# Patient Record
Sex: Female | Born: 1945 | Race: Black or African American | Hispanic: No | Smoking: Former smoker
Health system: Southern US, Community
[De-identification: ages and names within clinical notes are randomized; demographics above are authoritative.]

## PROBLEM LIST (undated history)

## (undated) DIAGNOSIS — Z950 Presence of cardiac pacemaker: Secondary | ICD-10-CM

## (undated) DIAGNOSIS — Z9221 Personal history of antineoplastic chemotherapy: Secondary | ICD-10-CM

## (undated) DIAGNOSIS — C50912 Malignant neoplasm of unspecified site of left female breast: Secondary | ICD-10-CM

## (undated) DIAGNOSIS — I5041 Acute combined systolic (congestive) and diastolic (congestive) heart failure: Secondary | ICD-10-CM

## (undated) DIAGNOSIS — N179 Acute kidney failure, unspecified: Secondary | ICD-10-CM

## (undated) DIAGNOSIS — I2510 Atherosclerotic heart disease of native coronary artery without angina pectoris: Secondary | ICD-10-CM

## (undated) DIAGNOSIS — I519 Heart disease, unspecified: Secondary | ICD-10-CM

## (undated) DIAGNOSIS — T4145XA Adverse effect of unspecified anesthetic, initial encounter: Secondary | ICD-10-CM

## (undated) DIAGNOSIS — E119 Type 2 diabetes mellitus without complications: Secondary | ICD-10-CM

## (undated) DIAGNOSIS — Z8739 Personal history of other diseases of the musculoskeletal system and connective tissue: Secondary | ICD-10-CM

## (undated) DIAGNOSIS — G4733 Obstructive sleep apnea (adult) (pediatric): Secondary | ICD-10-CM

## (undated) DIAGNOSIS — R011 Cardiac murmur, unspecified: Secondary | ICD-10-CM

## (undated) DIAGNOSIS — R0600 Dyspnea, unspecified: Secondary | ICD-10-CM

## (undated) DIAGNOSIS — E039 Hypothyroidism, unspecified: Secondary | ICD-10-CM

## (undated) DIAGNOSIS — Z923 Personal history of irradiation: Secondary | ICD-10-CM

## (undated) DIAGNOSIS — T8859XA Other complications of anesthesia, initial encounter: Secondary | ICD-10-CM

## (undated) DIAGNOSIS — D759 Disease of blood and blood-forming organs, unspecified: Secondary | ICD-10-CM

## (undated) DIAGNOSIS — M1711 Unilateral primary osteoarthritis, right knee: Secondary | ICD-10-CM

## (undated) DIAGNOSIS — Z531 Procedure and treatment not carried out because of patient's decision for reasons of belief and group pressure: Secondary | ICD-10-CM

## (undated) DIAGNOSIS — I10 Essential (primary) hypertension: Secondary | ICD-10-CM

## (undated) DIAGNOSIS — IMO0001 Reserved for inherently not codable concepts without codable children: Secondary | ICD-10-CM

## (undated) DIAGNOSIS — M1990 Unspecified osteoarthritis, unspecified site: Secondary | ICD-10-CM

## (undated) DIAGNOSIS — E669 Obesity, unspecified: Secondary | ICD-10-CM

## (undated) DIAGNOSIS — M10331 Gout due to renal impairment, right wrist: Secondary | ICD-10-CM

## (undated) DIAGNOSIS — I509 Heart failure, unspecified: Secondary | ICD-10-CM

## (undated) DIAGNOSIS — Z9989 Dependence on other enabling machines and devices: Secondary | ICD-10-CM

## (undated) HISTORY — DX: Obstructive sleep apnea (adult) (pediatric): G47.33

## (undated) HISTORY — PX: DILATION AND CURETTAGE OF UTERUS: SHX78

## (undated) HISTORY — PX: JOINT REPLACEMENT: SHX530

## (undated) HISTORY — DX: Obesity, unspecified: E66.9

## (undated) HISTORY — PX: TUBAL LIGATION: SHX77

## (undated) HISTORY — PX: CATARACT EXTRACTION W/ INTRAOCULAR LENS  IMPLANT, BILATERAL: SHX1307

## (undated) HISTORY — PX: CORONARY ANGIOPLASTY WITH STENT PLACEMENT: SHX49

## (undated) HISTORY — PX: TONSILLECTOMY: SUR1361

## (undated) HISTORY — DX: Acute combined systolic (congestive) and diastolic (congestive) heart failure: I50.41

---

## 1976-05-28 HISTORY — PX: THYROIDECTOMY, PARTIAL: SHX18

## 1983-05-29 DIAGNOSIS — C50912 Malignant neoplasm of unspecified site of left female breast: Secondary | ICD-10-CM

## 1983-05-29 HISTORY — DX: Malignant neoplasm of unspecified site of left female breast: C50.912

## 1983-05-29 HISTORY — PX: BREAST LUMPECTOMY: SHX2

## 1983-05-29 HISTORY — PX: BREAST LUMPECTOMY WITH NEEDLE LOCALIZATION AND AXILLARY LYMPH NODE DISSECTION: SHX5758

## 1983-05-29 HISTORY — PX: BREAST BIOPSY: SHX20

## 2012-09-25 HISTORY — PX: REPLACEMENT TOTAL KNEE: SUR1224

## 2012-12-22 ENCOUNTER — Encounter (HOSPITAL_COMMUNITY): Payer: Self-pay | Admitting: *Deleted

## 2012-12-22 ENCOUNTER — Emergency Department (INDEPENDENT_AMBULATORY_CARE_PROVIDER_SITE_OTHER)
Admission: EM | Admit: 2012-12-22 | Discharge: 2012-12-22 | Disposition: A | Discharge status: Home / Self Care | Payer: Medicare Other | Source: Home / Self Care

## 2012-12-22 DIAGNOSIS — B029 Zoster without complications: Secondary | ICD-10-CM

## 2012-12-22 HISTORY — DX: Heart disease, unspecified: I51.9

## 2012-12-22 MED ORDER — HYDROMORPHONE HCL 1 MG/ML IJ SOLN
2.0000 mg | Freq: Once | INTRAMUSCULAR | Status: AC
  Administered 2012-12-22: 2 mg via INTRAMUSCULAR

## 2012-12-22 MED ORDER — HYDROMORPHONE HCL PF 1 MG/ML IJ SOLN
INTRAMUSCULAR | Status: AC
  Filled 2012-12-22: qty 2

## 2012-12-22 MED ORDER — VALACYCLOVIR HCL 1 G PO TABS: 1000.0000 mg | ORAL_TABLET | Freq: Three times a day (TID) | ORAL | Status: AC

## 2012-12-22 MED ORDER — HYDROCODONE-ACETAMINOPHEN 10-325 MG PO TABS: 1.0000 | ORAL_TABLET | Freq: Four times a day (QID) | ORAL | Status: DC | PRN

## 2012-12-22 NOTE — ED Notes (Signed)
C/o blistery rash on L hip that goes around to L buttocks onset yesterday. Pain onset a few days ago.  C/o itching and burning.

## 2012-12-22 NOTE — ED Provider Notes (Signed)
CSN: [id number]     Arrival date & time 12/22/12  1549 History     First MD Initiated Contact with Patient 12/22/12 1633     Chief Complaint  Patient presents with  . Rash   (Consider location/radiation/quality/duration/timing/severity/associated sxs/prior Treatment) HPI  67 yo bf presents today with a painful, blistery rash on right lower back, buttock and groin area x 1-2 days.  Started with pain a couple of days ago then progressed to blisters.  Very painful and constant.  No previous hx of shingles.  Denies fever, chills, nausea, vomiting.  Here visiting from new [place].    Past Medical History  Diagnosis Date  . Diabetes mellitus without complication   . Hypertension   . Thyroid disease     hypothyroid  . Heart disease   . Cancer    Past Surgical History  Procedure Laterality Date  . Coronary stent placement  2010, 2012     2 done 2010 and 1 replaced 2012  . 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 Breast lumpectomy with needle localization and axillary lymph node dissection  1985   Family History  Problem Relation Age of Onset  . Multiple myeloma Mother   . Heart disease Father    History  Substance Use Topics  . Smoking status: Never Smoker   . Smokeless tobacco: Not on file  . Alcohol Use: Yes     Comment: ocassional   OB History   Grav Para Term Preterm Abortions TAB SAB Ect Mult Living                 Review of Systems  Constitutional: Negative.   HENT: Negative.   Eyes: Negative.   Respiratory: Negative.   Cardiovascular: Negative.   Gastrointestinal: Negative.   Endocrine: Negative.   Genitourinary: Negative.   Musculoskeletal: Negative.   Skin: Positive for rash.  Neurological: Negative.   Psychiatric/Behavioral: Negative.     Allergies  Sulfa antibiotics  Home Medications   Current Outpatient Rx  Name  Route  Sig  Dispense  Refill  . HYDROcodone-acetaminophen (NORCO) 10-325 MG per tablet   Oral   Take 1 tablet by mouth every 6 (six)  hours as needed for pain.   50 tablet   0   . valACYclovir (VALTREX) 1000 MG tablet   Oral   Take 1 tablet (1,000 mg total) by mouth 3 (three) times daily.   21 tablet   1     Take medication for one week.    BP 172/80  Pulse 76  Temp(Src) 98.9 F (37.2 C) (Oral)  Resp 18  SpO2 99% Physical Exam  Constitutional: She is oriented to person, place, and time. She appears well-developed and well-nourished.  HENT:  Head: Normocephalic and atraumatic.  Eyes: EOM are normal. Pupils are equal, round, and reactive to light.  Neck: Normal range of motion.  Pulmonary/Chest: Effort normal.  Musculoskeletal: Normal range of motion.  Neurological: She is alert and oriented to person, place, and time.  Skin:  Red blistery rash on right lower back, buttock and upper thigh.  Area tender to palpation. No signs of infection.    Psychiatric: She has a normal mood and affect.    ED Course   Procedures (including critical care time)  Labs Reviewed - No data to display No results found. 1. Shingles     MDM  Patient given dilaudid 2gm im injection today.   Scripts for valtrex and norco.  Will return to clinic if  symptoms worsen or go to ED.  All questions answered.  Advised that this is contagious.  Instructions given.   Meds ordered this encounter  Medications  . HYDROmorphone (DILAUDID) injection 2 mg    Sig:   . valACYclovir (VALTREX) 1000 MG tablet    Sig: Take 1 tablet (1,000 mg total) by mouth 3 (three) times daily.    Dispense:  21 tablet    Refill:  1    Take medication for one week.  Marland Kitchen HYDROcodone-acetaminophen (NORCO) 10-325 MG per tablet    Sig: Take 1 tablet by mouth every 6 (six) hours as needed for pain.    Dispense:  50 tablet    Refill:  0    Zonia Kief, PA-C 12/22/12 1736

## 2012-12-22 NOTE — ED Provider Notes (Signed)
Medical screening examination/treatment/procedure(s) were performed by resident physician or non-physician practitioner and as supervising physician I was immediately available for consultation/collaboration.   Juventino Pavone DOUGLAS MD.   Alyzza Andringa D Makelle Marrone, MD 12/22/12 2151 

## 2013-11-24 ENCOUNTER — Emergency Department (INDEPENDENT_AMBULATORY_CARE_PROVIDER_SITE_OTHER)
Admission: EM | Admit: 2013-11-24 | Discharge: 2013-11-24 | Disposition: A | Discharge status: Home / Self Care | Payer: Medicare Other | Source: Home / Self Care | Attending: Family Medicine | Admitting: Family Medicine

## 2013-11-24 ENCOUNTER — Encounter (HOSPITAL_COMMUNITY): Payer: Self-pay | Admitting: Emergency Medicine

## 2013-11-24 DIAGNOSIS — H6093 Unspecified otitis externa, bilateral: Secondary | ICD-10-CM

## 2013-11-24 DIAGNOSIS — H60399 Other infective otitis externa, unspecified ear: Secondary | ICD-10-CM

## 2013-11-24 MED ORDER — NEOMYCIN-POLYMYXIN-HC 1 % OT SOLN
4.0000 [drp] | Freq: Four times a day (QID) | OTIC | Status: DC
  Administered 2013-11-24: 4 [drp] via OTIC

## 2013-11-24 MED ORDER — ANTIPYRINE-BENZOCAINE 5.4-1.4 % OT SOLN: 3.0000 [drp] | OTIC | Status: DC | PRN

## 2013-11-24 NOTE — ED Notes (Signed)
C/o  Left ear pain x 1 wk.   States "feels like water is in ear".  No able to hear out of left ear.  Having fever.    No otc meds tried for treatment.

## 2013-11-24 NOTE — ED Provider Notes (Signed)
Danielle [name] is a 68 y.o. female who presents to Urgent Care today for bilateral left worse than right ear pain present for one week associated with decreased hearing and discharge. No fevers or chills nausea vomiting or diarrhea. The pain is moderate. Patient has tried peroxide which has not helped.   Past Medical History  Diagnosis Date  . Diabetes mellitus without complication   . Hypertension   . Thyroid disease     hypothyroid  . Heart disease   . Cancer    History  Substance Use Topics  . Smoking status: Never Smoker   . Smokeless tobacco: Not on file  . Alcohol Use: Yes     Comment: ocassional   ROS as above Medications: Current Facility-Administered Medications  Medication Dose Route Frequency Provider Last Rate Last Dose  . NEOMYCIN-POLYMYXIN-HYDROCORTISONE (CORTISPORIN) otic solution 4 drop  4 drop Both Ears 4 times per day Gregor Hams, MD   4 drop at 11/24/13 1754   Current Outpatient Prescriptions  Medication Sig Dispense Refill  . Alpha Lipoic Acid 200 MG CAPS Take by mouth.      Marland Kitchen aspirin 81 MG tablet Take 81 mg by mouth daily.      Marland Kitchen atorvastatin (LIPITOR) 20 MG tablet Take 20 mg by mouth at bedtime.      . calcium citrate (CALCITRATE - DOSED IN MG ELEMENTAL CALCIUM) 950 MG tablet Take 1 tablet by mouth daily.      . carvedilol (COREG CR) 40 MG 24 hr capsule Take 40 mg by mouth daily.      . clopidogrel (PLAVIX) 75 MG tablet Take 75 mg by mouth daily.      . diazepam (VALIUM) 5 MG tablet Take 5 mg by mouth every 8 (eight) hours as needed for anxiety.      . fish oil-omega-3 fatty acids 1000 MG capsule Take 1 g by mouth daily.      . folic acid (FOLVITE) 1 MG tablet Take 1 mg by mouth daily.      Marland Kitchen glucosamine-chondroitin 500-400 MG tablet Take 1 tablet by mouth 2 (two) times daily.      Marland Kitchen HYDROcodone-acetaminophen (NORCO) 10-325 MG per tablet Take 1 tablet by mouth every 6 (six) hours as needed for pain.  50 tablet  0  . insulin glargine (LANTUS) 100 UNIT/ML  injection Inject 40 Units into the skin 2 (two) times daily before a meal.      . insulin lispro (HUMALOG) 100 UNIT/ML injection Inject 10 Units into the skin 3 (three) times daily before meals.      . isosorbide mononitrate (IMDUR) 30 MG 24 hr tablet Take 30 mg by mouth daily.      Marland Kitchen levothyroxine (SYNTHROID, LEVOTHROID) 125 MCG tablet Take 125 mcg by mouth daily before breakfast.      . Magnesium Oxide 500 MG CAPS Take by mouth.      . meloxicam (MOBIC) 7.5 MG tablet Take 7.5 mg by mouth daily.      . Multiple Vitamin (MULTIVITAMIN) tablet Take 1 tablet by mouth daily.      Marland Kitchen NAPROXEN PO Take 500 mg by mouth 2 (two) times daily.      Marland Kitchen oxyCODONE-acetaminophen (PERCOCET/ROXICET) 5-325 MG per tablet Take 1 tablet by mouth every 4 (four) hours as needed for pain.      Marland Kitchen spironolactone (ALDACTONE) 25 MG tablet Take 25 mg by mouth daily.      . valsartan (DIOVAN) 40 MG tablet Take 40 mg by  mouth daily.      Marland Kitchen antipyrine-benzocaine (AURALGAN) otic solution Place 3-4 drops into both ears every 2 (two) hours as needed for ear pain.  10 mL  0  . torsemide (DEMADEX) 20 MG tablet Take 20 mg by mouth daily.        Exam:  BP 124/62  Pulse 85  Temp(Src) 98.9 F (37.2 C) (Oral)  Resp 18  SpO2 100% Gen: Well NAD HEENT: EOMI,  MMM bilateral ear canals are inflamed and erythematous with small amount of discharge and tender. Nontender mastoids bilaterally.  An ear wick was placed bilaterally. Cortisporin drops were placed into the left ear  Assessment and Plan: 68 y.o. female with otitis externa. Ear wick placed. Cortisporin and Auralgan drops  Discussed warning signs or symptoms. Please see discharge instructions. Patient expresses understanding.    Gregor Hams, MD 11/24/13 [phone]

## 2013-11-24 NOTE — Discharge Instructions (Signed)
Thank you for coming in today. The wick should come out in 2 days. Come back if they do not come out. Return if worsening.  Use the Cortisporin drops every 6 hours and the Auralgan drops every 2 hours for pain as needed  Otitis Externa Otitis externa is a bacterial or fungal infection of the outer ear canal. This is the area from the eardrum to the outside of the ear. Otitis externa is sometimes called "swimmer's ear." CAUSES  Possible causes of infection include:  Swimming in dirty water.  Moisture remaining in the ear after swimming or bathing.  Mild injury (trauma) to the ear.  Objects stuck in the ear (foreign body).  Cuts or scrapes (abrasions) on the outside of the ear. SYMPTOMS  The first symptom of infection is often itching in the ear canal. Later signs and symptoms may include swelling and redness of the ear canal, ear pain, and yellowish-white fluid (pus) coming from the ear. The ear pain may be worse when pulling on the earlobe. DIAGNOSIS  Your caregiver will perform a physical exam. A sample of fluid may be taken from the ear and examined for bacteria or fungi. TREATMENT  Antibiotic ear drops are often given for 10 to 14 days. Treatment may also include pain medicine or corticosteroids to reduce itching and swelling. PREVENTION   Keep your ear dry. Use the corner of a towel to absorb water out of the ear canal after swimming or bathing.  Avoid scratching or putting objects inside your ear. This can damage the ear canal or remove the protective wax that lines the canal. This makes it easier for bacteria and fungi to grow.  Avoid swimming in lakes, polluted water, or poorly chlorinated pools.  You may use ear drops made of rubbing alcohol and vinegar after swimming. Combine equal parts of white vinegar and alcohol in a bottle. Put 3 or 4 drops into each ear after swimming. HOME CARE INSTRUCTIONS   Apply antibiotic ear drops to the ear canal as prescribed by your  caregiver.  Only take over-the-counter or prescription medicines for pain, discomfort, or fever as directed by your caregiver.  If you have diabetes, follow any additional treatment instructions from your caregiver.  Keep all follow-up appointments as directed by your caregiver. SEEK MEDICAL CARE IF:   You have a fever.  Your ear is still red, swollen, painful, or draining pus after 3 days.  Your redness, swelling, or pain gets worse.  You have a severe headache.  You have redness, swelling, pain, or tenderness in the area behind your ear. MAKE SURE YOU:   Understand these instructions.  Will watch your condition.  Will get help right away if you are not doing well or get worse. Document Released: 05/14/2005 Document Revised: 08/06/2011 Document Reviewed: 05/31/2011 [facility] Patient Information 2015 [place], [place]. This information is not intended to replace advice given to you by your health care provider. Make sure you discuss any questions you have with your health care provider.

## 2013-12-08 ENCOUNTER — Encounter (HOSPITAL_COMMUNITY): Payer: Self-pay | Admitting: Emergency Medicine

## 2013-12-08 ENCOUNTER — Emergency Department (INDEPENDENT_AMBULATORY_CARE_PROVIDER_SITE_OTHER)
Admission: EM | Admit: 2013-12-08 | Discharge: 2013-12-08 | Disposition: A | Discharge status: Home / Self Care | Payer: Medicare Other | Source: Home / Self Care | Attending: Family Medicine | Admitting: Family Medicine

## 2013-12-08 DIAGNOSIS — H6093 Unspecified otitis externa, bilateral: Secondary | ICD-10-CM

## 2013-12-08 DIAGNOSIS — H60399 Other infective otitis externa, unspecified ear: Secondary | ICD-10-CM

## 2013-12-08 MED ORDER — OFLOXACIN 0.3 % OT SOLN: 10.0000 [drp] | Freq: Every day | OTIC | Status: DC

## 2013-12-08 NOTE — ED Provider Notes (Signed)
CSN: [id number]     Arrival date & time 12/08/13  1643 History   First MD Initiated Contact with Patient 12/08/13 1718     Chief Complaint  Patient presents with  . Otalgia   (Consider location/radiation/quality/duration/timing/severity/associated sxs/prior Treatment) HPI Comments: Was recently treated with Polymyxin ear drops and symptoms have not improved.  Is here visiting her daughter from [place].   Patient is a 68 y.o. female presenting with ear pain. The history is provided by the patient.  Otalgia Location:  Bilateral Behind ear:  No abnormality Quality:  Aching and sharp Severity:  Moderate Onset quality:  Gradual Duration:  3 weeks Timing:  Constant Progression:  Unchanged Chronicity:  New Associated symptoms: ear discharge   Associated symptoms: no fever     Past Medical History  Diagnosis Date  . Diabetes mellitus without complication   . Hypertension   . Heart disease   . Cancer     breast  . Thyroid disease     hypothyroid   Past Surgical History  Procedure Laterality Date  . Coronary stent placement  2010, 2012     2 done 2010 and 1 replaced 2012  . 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 Breast lumpectomy with needle localization and axillary lymph node dissection  1985  . Thyroidectomy, partial  1978   Family History  Problem Relation Age of Onset  . Multiple myeloma Mother   . Heart disease Father    History  Substance Use Topics  . Smoking status: Never Smoker   . Smokeless tobacco: Not on file  . Alcohol Use: Yes     Comment: ocassional   OB History   Grav Para Term Preterm Abortions TAB SAB Ect Mult Living                 Review of Systems  Constitutional: Negative for fever.  HENT: Positive for ear discharge and ear pain.   All other systems reviewed and are negative.   Allergies  Sulfa antibiotics  Home Medications   Prior to Admission medications   Medication Sig Start Date End Date Taking? Authorizing Provider  aspirin 81 MG  tablet Take 81 mg by mouth daily.   Yes Historical Provider, MD  atorvastatin (LIPITOR) 20 MG tablet Take 20 mg by mouth at bedtime.   Yes Historical Provider, MD  carvedilol (COREG CR) 40 MG 24 hr capsule Take 40 mg by mouth daily.   Yes Historical Provider, MD  clopidogrel (PLAVIX) 75 MG tablet Take 75 mg by mouth daily.   Yes Historical Provider, MD  [facility] Liver Oil OIL Take 5 mLs by mouth every morning.   Yes Historical Provider, MD  folic acid (FOLVITE) 1 MG tablet Take 1 mg by mouth daily.   Yes Historical Provider, MD  insulin lispro (HUMALOG) 100 UNIT/ML injection Inject 10 Units into the skin 3 (three) times daily before meals.   Yes Historical Provider, MD  levothyroxine (SYNTHROID, LEVOTHROID) 125 MCG tablet Take 125 mcg by mouth daily before breakfast.   Yes Historical Provider, MD  torsemide (DEMADEX) 20 MG tablet Take 20 mg by mouth daily.   Yes Historical Provider, MD  valsartan (DIOVAN) 40 MG tablet Take 40 mg by mouth 2 (two) times daily.    Yes Historical Provider, MD  Alpha Lipoic Acid 200 MG CAPS Take by mouth.    Historical Provider, MD  antipyrine-benzocaine Toniann Fail) otic solution Place 3-4 drops into both ears every 2 (two) hours as needed for ear pain.  11/24/13   Gregor Hams, MD  calcium citrate (CALCITRATE - DOSED IN MG ELEMENTAL CALCIUM) 950 MG tablet Take 1 tablet by mouth daily.    Historical Provider, MD  diazepam (VALIUM) 5 MG tablet Take 5 mg by mouth every 8 (eight) hours as needed for anxiety.    Historical Provider, MD  fish oil-omega-3 fatty acids 1000 MG capsule Take 1 g by mouth daily.    Historical Provider, MD  glucosamine-chondroitin 500-400 MG tablet Take 1 tablet by mouth 2 (two) times daily.    Historical Provider, MD  HYDROcodone-acetaminophen (NORCO) 10-325 MG per tablet Take 1 tablet by mouth every 6 (six) hours as needed for pain. 12/22/12   Benjiman Core, PA-C  insulin glargine (LANTUS) 100 UNIT/ML injection Inject 45 Units into the skin 2 (two) times  daily before a meal.     Historical Provider, MD  isosorbide mononitrate (IMDUR) 30 MG 24 hr tablet Take 30 mg by mouth daily.    Historical Provider, MD  Magnesium Oxide 500 MG CAPS Take by mouth.    Historical Provider, MD  meloxicam (MOBIC) 7.5 MG tablet Take 7.5 mg by mouth daily.    Historical Provider, MD  Multiple Vitamin (MULTIVITAMIN) tablet Take 1 tablet by mouth daily.    Historical Provider, MD  NAPROXEN PO Take 500 mg by mouth 2 (two) times daily.    Historical Provider, MD  ofloxacin (FLOXIN) 0.3 % otic solution Place 10 drops into both ears daily. X 7 days 12/08/13   Lahoma Rocker, PA  oxyCODONE-acetaminophen (PERCOCET/ROXICET) 5-325 MG per tablet Take 1 tablet by mouth every 4 (four) hours as needed for pain.    Historical Provider, MD  spironolactone (ALDACTONE) 25 MG tablet Take 25 mg by mouth daily.    Historical Provider, MD   BP 136/69  Pulse 78  Temp(Src) 98.5 F (36.9 C) (Oral)  Resp 16  SpO2 98% Physical Exam  Nursing note and vitals reviewed. Constitutional: She is oriented to person, place, and time. She appears well-developed and well-nourished.  HENT:  Head: Normocephalic and atraumatic.  Right Ear: There is tenderness. No foreign bodies. No mastoid tenderness. Decreased hearing is noted.  Left Ear: There is tenderness. No foreign bodies. No mastoid tenderness. Decreased hearing is noted.  Nose: Nose normal.  Mouth/Throat: Oropharynx is clear and moist.  Moderate bilateral otitis external with moderate amount of white/yellow exudate in ear canals that obstructs visualization of TMs.  Eyes: Conjunctivae are normal. No scleral icterus.  Neck: Normal range of motion. Neck supple.  Cardiovascular: Normal rate.   Pulmonary/Chest: Effort normal.  Musculoskeletal: Normal range of motion.  Lymphadenopathy:    She has no cervical adenopathy.  Neurological: She is alert and oriented to person, place, and time.  Skin: Skin is warm and dry.  Psychiatric: Her  behavior is normal.    ED Course  Procedures (including critical care time) Labs Review Labs Reviewed - No data to display  Imaging Review No results found.   MDM   1. Otitis externa of both ears    Will discontinue Polymyxin and provide Rx for Ofloxacin otic and advise follow up with [facility]NT if no improvement over the next 4-5 days.    [place], [place] 12/08/13 [phone]

## 2013-12-08 NOTE — Discharge Instructions (Signed)
Discontinue Polymyxin ear drops and begin using the Ofloxacin ear drops as directed and follow up with Dr. Redmond Baseman if no improvement.  Otitis Externa Otitis externa is a bacterial or fungal infection of the outer ear canal. This is the area from the eardrum to the outside of the ear. Otitis externa is sometimes called "swimmer's ear." CAUSES  Possible causes of infection include:  Swimming in dirty water.  Moisture remaining in the ear after swimming or bathing.  Mild injury (trauma) to the ear.  Objects stuck in the ear (foreign body).  Cuts or scrapes (abrasions) on the outside of the ear. SYMPTOMS  The first symptom of infection is often itching in the ear canal. Later signs and symptoms may include swelling and redness of the ear canal, ear pain, and yellowish-white fluid (pus) coming from the ear. The ear pain may be worse when pulling on the earlobe. DIAGNOSIS  Your caregiver will perform a physical exam. A sample of fluid may be taken from the ear and examined for bacteria or fungi. TREATMENT  Antibiotic ear drops are often given for 10 to 14 days. Treatment may also include pain medicine or corticosteroids to reduce itching and swelling. PREVENTION   Keep your ear dry. Use the corner of a towel to absorb water out of the ear canal after swimming or bathing.  Avoid scratching or putting objects inside your ear. This can damage the ear canal or remove the protective wax that lines the canal. This makes it easier for bacteria and fungi to grow.  Avoid swimming in lakes, polluted water, or poorly chlorinated pools.  You may use ear drops made of rubbing alcohol and vinegar after swimming. Combine equal parts of white vinegar and alcohol in a bottle. Put 3 or 4 drops into each ear after swimming. HOME CARE INSTRUCTIONS   Apply antibiotic ear drops to the ear canal as prescribed by your caregiver.  Only take over-the-counter or prescription medicines for pain, discomfort, or fever  as directed by your caregiver.  If you have diabetes, follow any additional treatment instructions from your caregiver.  Keep all follow-up appointments as directed by your caregiver. SEEK MEDICAL CARE IF:   You have a fever.  Your ear is still red, swollen, painful, or draining pus after 3 days.  Your redness, swelling, or pain gets worse.  You have a severe headache.  You have redness, swelling, pain, or tenderness in the area behind your ear. MAKE SURE YOU:   Understand these instructions.  Will watch your condition.  Will get help right away if you are not doing well or get worse. Document Released: 05/14/2005 Document Revised: 08/06/2011 Document Reviewed: 05/31/2011 [facility] Patient Information 2015 [place], [place]. This information is not intended to replace advice given to you by your health care provider. Make sure you discuss any questions you have with your health care provider.

## 2013-12-08 NOTE — ED Notes (Signed)
C/o bil. ear pain, L right onset 3 weeks ago.  Was seen here and given antibiotic ear drops and drops for pain. Had ear wicks put in and they came out the next day.  C/o dead skin in her ear and thinks it might be a fungal infection.

## 2013-12-09 NOTE — ED Provider Notes (Signed)
Medical screening examination/treatment/procedure(s) were performed by non-physician practitioner and as supervising physician I was immediately available for consultation/collaboration.  Philipp Deputy, M.D.  Harden Mo, MD 12/09/13 [phone]

## 2014-07-19 HISTORY — PX: INSERT / REPLACE / REMOVE PACEMAKER: SUR710

## 2016-06-04 DIAGNOSIS — R062 Wheezing: Secondary | ICD-10-CM | POA: Diagnosis not present

## 2016-06-04 DIAGNOSIS — J302 Other seasonal allergic rhinitis: Secondary | ICD-10-CM | POA: Diagnosis not present

## 2016-06-04 DIAGNOSIS — J019 Acute sinusitis, unspecified: Secondary | ICD-10-CM | POA: Diagnosis not present

## 2016-06-05 DIAGNOSIS — J0100 Acute maxillary sinusitis, unspecified: Secondary | ICD-10-CM | POA: Diagnosis not present

## 2016-06-07 DIAGNOSIS — Z794 Long term (current) use of insulin: Secondary | ICD-10-CM | POA: Diagnosis not present

## 2016-06-07 DIAGNOSIS — H43813 Vitreous degeneration, bilateral: Secondary | ICD-10-CM | POA: Diagnosis not present

## 2016-06-07 DIAGNOSIS — E113313 Type 2 diabetes mellitus with moderate nonproliferative diabetic retinopathy with macular edema, bilateral: Secondary | ICD-10-CM | POA: Diagnosis not present

## 2016-06-07 DIAGNOSIS — H35371 Puckering of macula, right eye: Secondary | ICD-10-CM | POA: Diagnosis not present

## 2016-06-09 DIAGNOSIS — M7989 Other specified soft tissue disorders: Secondary | ICD-10-CM | POA: Diagnosis not present

## 2016-06-09 DIAGNOSIS — I972 Postmastectomy lymphedema syndrome: Secondary | ICD-10-CM | POA: Diagnosis not present

## 2016-06-11 DIAGNOSIS — M7989 Other specified soft tissue disorders: Secondary | ICD-10-CM | POA: Diagnosis not present

## 2016-06-11 DIAGNOSIS — I972 Postmastectomy lymphedema syndrome: Secondary | ICD-10-CM | POA: Diagnosis not present

## 2016-06-13 DIAGNOSIS — I972 Postmastectomy lymphedema syndrome: Secondary | ICD-10-CM | POA: Diagnosis not present

## 2016-06-13 DIAGNOSIS — M7989 Other specified soft tissue disorders: Secondary | ICD-10-CM | POA: Diagnosis not present

## 2016-06-16 DIAGNOSIS — I972 Postmastectomy lymphedema syndrome: Secondary | ICD-10-CM | POA: Diagnosis not present

## 2016-06-16 DIAGNOSIS — M7989 Other specified soft tissue disorders: Secondary | ICD-10-CM | POA: Diagnosis not present

## 2016-06-20 DIAGNOSIS — M7989 Other specified soft tissue disorders: Secondary | ICD-10-CM | POA: Diagnosis not present

## 2016-06-20 DIAGNOSIS — I972 Postmastectomy lymphedema syndrome: Secondary | ICD-10-CM | POA: Diagnosis not present

## 2016-06-23 DIAGNOSIS — M7989 Other specified soft tissue disorders: Secondary | ICD-10-CM | POA: Diagnosis not present

## 2016-06-23 DIAGNOSIS — I972 Postmastectomy lymphedema syndrome: Secondary | ICD-10-CM | POA: Diagnosis not present

## 2016-06-27 DIAGNOSIS — I972 Postmastectomy lymphedema syndrome: Secondary | ICD-10-CM | POA: Diagnosis not present

## 2016-06-27 DIAGNOSIS — M7989 Other specified soft tissue disorders: Secondary | ICD-10-CM | POA: Diagnosis not present

## 2016-07-02 DIAGNOSIS — M7989 Other specified soft tissue disorders: Secondary | ICD-10-CM | POA: Diagnosis not present

## 2016-07-02 DIAGNOSIS — I972 Postmastectomy lymphedema syndrome: Secondary | ICD-10-CM | POA: Diagnosis not present

## 2016-07-03 DIAGNOSIS — R42 Dizziness and giddiness: Secondary | ICD-10-CM | POA: Diagnosis not present

## 2016-07-03 DIAGNOSIS — D519 Vitamin B12 deficiency anemia, unspecified: Secondary | ICD-10-CM | POA: Diagnosis not present

## 2016-07-03 DIAGNOSIS — I10 Essential (primary) hypertension: Secondary | ICD-10-CM | POA: Diagnosis not present

## 2016-07-03 DIAGNOSIS — Z7951 Long term (current) use of inhaled steroids: Secondary | ICD-10-CM | POA: Diagnosis not present

## 2016-07-03 DIAGNOSIS — R609 Edema, unspecified: Secondary | ICD-10-CM | POA: Diagnosis not present

## 2016-07-03 DIAGNOSIS — H902 Conductive hearing loss, unspecified: Secondary | ICD-10-CM | POA: Diagnosis not present

## 2016-07-03 DIAGNOSIS — E1165 Type 2 diabetes mellitus with hyperglycemia: Secondary | ICD-10-CM | POA: Diagnosis not present

## 2016-07-03 DIAGNOSIS — M150 Primary generalized (osteo)arthritis: Secondary | ICD-10-CM | POA: Diagnosis not present

## 2016-07-03 DIAGNOSIS — R7989 Other specified abnormal findings of blood chemistry: Secondary | ICD-10-CM | POA: Diagnosis not present

## 2016-07-03 DIAGNOSIS — R5383 Other fatigue: Secondary | ICD-10-CM | POA: Diagnosis not present

## 2016-07-03 DIAGNOSIS — E559 Vitamin D deficiency, unspecified: Secondary | ICD-10-CM | POA: Diagnosis not present

## 2016-07-03 DIAGNOSIS — E1142 Type 2 diabetes mellitus with diabetic polyneuropathy: Secondary | ICD-10-CM | POA: Diagnosis not present

## 2016-07-04 DIAGNOSIS — H6123 Impacted cerumen, bilateral: Secondary | ICD-10-CM | POA: Diagnosis not present

## 2016-07-04 DIAGNOSIS — H938X2 Other specified disorders of left ear: Secondary | ICD-10-CM | POA: Diagnosis not present

## 2016-07-06 DIAGNOSIS — M7989 Other specified soft tissue disorders: Secondary | ICD-10-CM | POA: Diagnosis not present

## 2016-07-06 DIAGNOSIS — I972 Postmastectomy lymphedema syndrome: Secondary | ICD-10-CM | POA: Diagnosis not present

## 2016-07-10 DIAGNOSIS — E1140 Type 2 diabetes mellitus with diabetic neuropathy, unspecified: Secondary | ICD-10-CM | POA: Diagnosis not present

## 2016-07-10 DIAGNOSIS — I2510 Atherosclerotic heart disease of native coronary artery without angina pectoris: Secondary | ICD-10-CM | POA: Diagnosis not present

## 2016-07-10 DIAGNOSIS — K2900 Acute gastritis without bleeding: Secondary | ICD-10-CM | POA: Diagnosis not present

## 2016-07-10 DIAGNOSIS — I10 Essential (primary) hypertension: Secondary | ICD-10-CM | POA: Diagnosis not present

## 2016-07-10 DIAGNOSIS — D561 Beta thalassemia: Secondary | ICD-10-CM | POA: Diagnosis not present

## 2016-07-11 DIAGNOSIS — I972 Postmastectomy lymphedema syndrome: Secondary | ICD-10-CM | POA: Diagnosis not present

## 2016-07-11 DIAGNOSIS — M7989 Other specified soft tissue disorders: Secondary | ICD-10-CM | POA: Diagnosis not present

## 2016-07-12 DIAGNOSIS — Z950 Presence of cardiac pacemaker: Secondary | ICD-10-CM | POA: Diagnosis not present

## 2016-07-12 DIAGNOSIS — I495 Sick sinus syndrome: Secondary | ICD-10-CM | POA: Diagnosis not present

## 2016-07-13 DIAGNOSIS — M7989 Other specified soft tissue disorders: Secondary | ICD-10-CM | POA: Diagnosis not present

## 2016-07-13 DIAGNOSIS — I972 Postmastectomy lymphedema syndrome: Secondary | ICD-10-CM | POA: Diagnosis not present

## 2016-07-18 DIAGNOSIS — M67362 Transient synovitis, left knee: Secondary | ICD-10-CM | POA: Diagnosis not present

## 2016-07-18 DIAGNOSIS — M5416 Radiculopathy, lumbar region: Secondary | ICD-10-CM | POA: Diagnosis not present

## 2016-07-23 DIAGNOSIS — I972 Postmastectomy lymphedema syndrome: Secondary | ICD-10-CM | POA: Diagnosis not present

## 2016-07-23 DIAGNOSIS — M7989 Other specified soft tissue disorders: Secondary | ICD-10-CM | POA: Diagnosis not present

## 2016-07-24 DIAGNOSIS — R1011 Right upper quadrant pain: Secondary | ICD-10-CM | POA: Diagnosis not present

## 2016-07-24 DIAGNOSIS — R920 Mammographic microcalcification found on diagnostic imaging of breast: Secondary | ICD-10-CM | POA: Diagnosis not present

## 2016-07-25 DIAGNOSIS — M7989 Other specified soft tissue disorders: Secondary | ICD-10-CM | POA: Diagnosis not present

## 2016-07-25 DIAGNOSIS — I972 Postmastectomy lymphedema syndrome: Secondary | ICD-10-CM | POA: Diagnosis not present

## 2016-07-30 DIAGNOSIS — R7989 Other specified abnormal findings of blood chemistry: Secondary | ICD-10-CM | POA: Diagnosis not present

## 2016-07-30 DIAGNOSIS — H6502 Acute serous otitis media, left ear: Secondary | ICD-10-CM | POA: Diagnosis not present

## 2016-07-30 DIAGNOSIS — I10 Essential (primary) hypertension: Secondary | ICD-10-CM | POA: Diagnosis not present

## 2016-07-30 DIAGNOSIS — Z0001 Encounter for general adult medical examination with abnormal findings: Secondary | ICD-10-CM | POA: Diagnosis not present

## 2016-07-30 DIAGNOSIS — R5383 Other fatigue: Secondary | ICD-10-CM | POA: Diagnosis not present

## 2016-07-30 DIAGNOSIS — D561 Beta thalassemia: Secondary | ICD-10-CM | POA: Diagnosis not present

## 2016-07-30 DIAGNOSIS — E1142 Type 2 diabetes mellitus with diabetic polyneuropathy: Secondary | ICD-10-CM | POA: Diagnosis not present

## 2016-08-06 DIAGNOSIS — Z45018 Encounter for adjustment and management of other part of cardiac pacemaker: Secondary | ICD-10-CM | POA: Diagnosis not present

## 2016-08-06 DIAGNOSIS — I495 Sick sinus syndrome: Secondary | ICD-10-CM | POA: Diagnosis not present

## 2016-08-06 DIAGNOSIS — I972 Postmastectomy lymphedema syndrome: Secondary | ICD-10-CM | POA: Diagnosis not present

## 2016-08-06 DIAGNOSIS — M7989 Other specified soft tissue disorders: Secondary | ICD-10-CM | POA: Diagnosis not present

## 2016-08-08 DIAGNOSIS — I2510 Atherosclerotic heart disease of native coronary artery without angina pectoris: Secondary | ICD-10-CM | POA: Diagnosis not present

## 2016-08-08 DIAGNOSIS — D509 Iron deficiency anemia, unspecified: Secondary | ICD-10-CM | POA: Diagnosis not present

## 2016-08-08 DIAGNOSIS — I679 Cerebrovascular disease, unspecified: Secondary | ICD-10-CM | POA: Diagnosis not present

## 2016-08-08 DIAGNOSIS — R1310 Dysphagia, unspecified: Secondary | ICD-10-CM | POA: Diagnosis not present

## 2016-08-08 DIAGNOSIS — M79609 Pain in unspecified limb: Secondary | ICD-10-CM | POA: Diagnosis not present

## 2016-08-08 DIAGNOSIS — I10 Essential (primary) hypertension: Secondary | ICD-10-CM | POA: Diagnosis not present

## 2016-08-14 DIAGNOSIS — I5033 Acute on chronic diastolic (congestive) heart failure: Secondary | ICD-10-CM | POA: Diagnosis not present

## 2016-08-14 DIAGNOSIS — I350 Nonrheumatic aortic (valve) stenosis: Secondary | ICD-10-CM | POA: Diagnosis not present

## 2016-08-14 DIAGNOSIS — I25118 Atherosclerotic heart disease of native coronary artery with other forms of angina pectoris: Secondary | ICD-10-CM | POA: Diagnosis not present

## 2016-08-14 DIAGNOSIS — I252 Old myocardial infarction: Secondary | ICD-10-CM | POA: Diagnosis not present

## 2016-09-04 DIAGNOSIS — L981 Factitial dermatitis: Secondary | ICD-10-CM | POA: Diagnosis not present

## 2016-09-04 DIAGNOSIS — R0602 Shortness of breath: Secondary | ICD-10-CM | POA: Diagnosis not present

## 2016-09-04 DIAGNOSIS — I10 Essential (primary) hypertension: Secondary | ICD-10-CM | POA: Diagnosis not present

## 2016-09-04 DIAGNOSIS — L853 Xerosis cutis: Secondary | ICD-10-CM | POA: Diagnosis not present

## 2016-09-04 DIAGNOSIS — Z0001 Encounter for general adult medical examination with abnormal findings: Secondary | ICD-10-CM | POA: Diagnosis not present

## 2016-09-04 DIAGNOSIS — L298 Other pruritus: Secondary | ICD-10-CM | POA: Diagnosis not present

## 2016-09-06 DIAGNOSIS — Z7189 Other specified counseling: Secondary | ICD-10-CM | POA: Diagnosis not present

## 2016-09-06 DIAGNOSIS — E1142 Type 2 diabetes mellitus with diabetic polyneuropathy: Secondary | ICD-10-CM | POA: Diagnosis not present

## 2016-09-06 DIAGNOSIS — Z713 Dietary counseling and surveillance: Secondary | ICD-10-CM | POA: Diagnosis not present

## 2016-09-06 DIAGNOSIS — D561 Beta thalassemia: Secondary | ICD-10-CM | POA: Diagnosis not present

## 2016-09-06 DIAGNOSIS — R635 Abnormal weight gain: Secondary | ICD-10-CM | POA: Diagnosis not present

## 2016-09-06 DIAGNOSIS — Z794 Long term (current) use of insulin: Secondary | ICD-10-CM | POA: Diagnosis not present

## 2016-09-06 DIAGNOSIS — E038 Other specified hypothyroidism: Secondary | ICD-10-CM | POA: Diagnosis not present

## 2016-09-11 DIAGNOSIS — E083293 Diabetes mellitus due to underlying condition with mild nonproliferative diabetic retinopathy without macular edema, bilateral: Secondary | ICD-10-CM | POA: Diagnosis not present

## 2016-09-11 DIAGNOSIS — H4010X1 Unspecified open-angle glaucoma, mild stage: Secondary | ICD-10-CM | POA: Diagnosis not present

## 2016-09-12 DIAGNOSIS — Z01419 Encounter for gynecological examination (general) (routine) without abnormal findings: Secondary | ICD-10-CM | POA: Diagnosis not present

## 2016-09-20 DIAGNOSIS — I5033 Acute on chronic diastolic (congestive) heart failure: Secondary | ICD-10-CM | POA: Diagnosis not present

## 2016-09-20 DIAGNOSIS — I25118 Atherosclerotic heart disease of native coronary artery with other forms of angina pectoris: Secondary | ICD-10-CM | POA: Diagnosis not present

## 2016-09-20 DIAGNOSIS — I10 Essential (primary) hypertension: Secondary | ICD-10-CM | POA: Diagnosis not present

## 2016-09-20 DIAGNOSIS — I350 Nonrheumatic aortic (valve) stenosis: Secondary | ICD-10-CM | POA: Diagnosis not present

## 2016-09-25 DIAGNOSIS — Z794 Long term (current) use of insulin: Secondary | ICD-10-CM | POA: Diagnosis not present

## 2016-09-25 DIAGNOSIS — E038 Other specified hypothyroidism: Secondary | ICD-10-CM | POA: Diagnosis not present

## 2016-09-25 DIAGNOSIS — E559 Vitamin D deficiency, unspecified: Secondary | ICD-10-CM | POA: Diagnosis not present

## 2016-09-25 DIAGNOSIS — R7989 Other specified abnormal findings of blood chemistry: Secondary | ICD-10-CM | POA: Diagnosis not present

## 2016-09-25 DIAGNOSIS — R635 Abnormal weight gain: Secondary | ICD-10-CM | POA: Diagnosis not present

## 2016-09-25 DIAGNOSIS — D649 Anemia, unspecified: Secondary | ICD-10-CM | POA: Diagnosis not present

## 2016-09-25 DIAGNOSIS — D519 Vitamin B12 deficiency anemia, unspecified: Secondary | ICD-10-CM | POA: Diagnosis not present

## 2016-09-25 DIAGNOSIS — Z7189 Other specified counseling: Secondary | ICD-10-CM | POA: Diagnosis not present

## 2016-09-25 DIAGNOSIS — D561 Beta thalassemia: Secondary | ICD-10-CM | POA: Diagnosis not present

## 2016-09-25 DIAGNOSIS — Z713 Dietary counseling and surveillance: Secondary | ICD-10-CM | POA: Diagnosis not present

## 2016-09-25 DIAGNOSIS — E1142 Type 2 diabetes mellitus with diabetic polyneuropathy: Secondary | ICD-10-CM | POA: Diagnosis not present

## 2016-09-25 DIAGNOSIS — E1140 Type 2 diabetes mellitus with diabetic neuropathy, unspecified: Secondary | ICD-10-CM | POA: Diagnosis not present

## 2016-12-17 DIAGNOSIS — H43813 Vitreous degeneration, bilateral: Secondary | ICD-10-CM | POA: Diagnosis not present

## 2016-12-17 DIAGNOSIS — E113313 Type 2 diabetes mellitus with moderate nonproliferative diabetic retinopathy with macular edema, bilateral: Secondary | ICD-10-CM | POA: Diagnosis not present

## 2016-12-17 DIAGNOSIS — Z794 Long term (current) use of insulin: Secondary | ICD-10-CM | POA: Diagnosis not present

## 2016-12-17 DIAGNOSIS — H409 Unspecified glaucoma: Secondary | ICD-10-CM | POA: Diagnosis not present

## 2016-12-17 DIAGNOSIS — H35371 Puckering of macula, right eye: Secondary | ICD-10-CM | POA: Diagnosis not present

## 2016-12-21 DIAGNOSIS — D519 Vitamin B12 deficiency anemia, unspecified: Secondary | ICD-10-CM | POA: Diagnosis not present

## 2016-12-21 DIAGNOSIS — E559 Vitamin D deficiency, unspecified: Secondary | ICD-10-CM | POA: Diagnosis not present

## 2016-12-21 DIAGNOSIS — R5383 Other fatigue: Secondary | ICD-10-CM | POA: Diagnosis not present

## 2016-12-21 DIAGNOSIS — Z760 Encounter for issue of repeat prescription: Secondary | ICD-10-CM | POA: Diagnosis not present

## 2016-12-21 DIAGNOSIS — I10 Essential (primary) hypertension: Secondary | ICD-10-CM | POA: Diagnosis not present

## 2016-12-21 DIAGNOSIS — M542 Cervicalgia: Secondary | ICD-10-CM | POA: Diagnosis not present

## 2016-12-21 DIAGNOSIS — E119 Type 2 diabetes mellitus without complications: Secondary | ICD-10-CM | POA: Diagnosis not present

## 2016-12-21 DIAGNOSIS — E7800 Pure hypercholesterolemia, unspecified: Secondary | ICD-10-CM | POA: Diagnosis not present

## 2016-12-21 DIAGNOSIS — R42 Dizziness and giddiness: Secondary | ICD-10-CM | POA: Diagnosis not present

## 2016-12-21 DIAGNOSIS — R7989 Other specified abnormal findings of blood chemistry: Secondary | ICD-10-CM | POA: Diagnosis not present

## 2016-12-21 DIAGNOSIS — R5381 Other malaise: Secondary | ICD-10-CM | POA: Diagnosis not present

## 2017-01-13 DIAGNOSIS — I495 Sick sinus syndrome: Secondary | ICD-10-CM | POA: Diagnosis not present

## 2017-01-15 ENCOUNTER — Telehealth: Payer: Self-pay

## 2017-01-15 NOTE — Telephone Encounter (Signed)
Records received via fax from [facility]. Placed in Chart Prep.

## 2017-01-15 NOTE — Telephone Encounter (Signed)
Release forms faxed to  1.[facility] [place] @ [phone]. 2.[facility] @ [phone]

## 2017-02-03 ENCOUNTER — Emergency Department (HOSPITAL_COMMUNITY): Payer: Medicare Other

## 2017-02-03 ENCOUNTER — Inpatient Hospital Stay (HOSPITAL_COMMUNITY)
Admission: EM | Admit: 2017-02-03 | Discharge: 2017-02-06 | DRG: 291 | Disposition: A | Discharge status: Home / Self Care | Payer: Medicare Other | Attending: Internal Medicine | Admitting: Internal Medicine

## 2017-02-03 ENCOUNTER — Encounter (HOSPITAL_COMMUNITY): Payer: Self-pay | Admitting: *Deleted

## 2017-02-03 DIAGNOSIS — R0989 Other specified symptoms and signs involving the circulatory and respiratory systems: Secondary | ICD-10-CM

## 2017-02-03 DIAGNOSIS — R069 Unspecified abnormalities of breathing: Secondary | ICD-10-CM | POA: Diagnosis not present

## 2017-02-03 DIAGNOSIS — G8929 Other chronic pain: Secondary | ICD-10-CM | POA: Diagnosis present

## 2017-02-03 DIAGNOSIS — E119 Type 2 diabetes mellitus without complications: Secondary | ICD-10-CM | POA: Diagnosis not present

## 2017-02-03 DIAGNOSIS — I050 Rheumatic mitral stenosis: Secondary | ICD-10-CM | POA: Diagnosis present

## 2017-02-03 DIAGNOSIS — I110 Hypertensive heart disease with heart failure: Secondary | ICD-10-CM | POA: Diagnosis present

## 2017-02-03 DIAGNOSIS — E785 Hyperlipidemia, unspecified: Secondary | ICD-10-CM | POA: Diagnosis present

## 2017-02-03 DIAGNOSIS — I5021 Acute systolic (congestive) heart failure: Secondary | ICD-10-CM | POA: Diagnosis not present

## 2017-02-03 DIAGNOSIS — R0602 Shortness of breath: Secondary | ICD-10-CM

## 2017-02-03 DIAGNOSIS — E079 Disorder of thyroid, unspecified: Secondary | ICD-10-CM | POA: Diagnosis not present

## 2017-02-03 DIAGNOSIS — I10 Essential (primary) hypertension: Secondary | ICD-10-CM | POA: Diagnosis present

## 2017-02-03 DIAGNOSIS — E872 Acidosis, unspecified: Secondary | ICD-10-CM | POA: Diagnosis present

## 2017-02-03 DIAGNOSIS — Z7902 Long term (current) use of antithrombotics/antiplatelets: Secondary | ICD-10-CM

## 2017-02-03 DIAGNOSIS — D509 Iron deficiency anemia, unspecified: Secondary | ICD-10-CM | POA: Diagnosis present

## 2017-02-03 DIAGNOSIS — I248 Other forms of acute ischemic heart disease: Secondary | ICD-10-CM | POA: Diagnosis present

## 2017-02-03 DIAGNOSIS — I5041 Acute combined systolic (congestive) and diastolic (congestive) heart failure: Secondary | ICD-10-CM

## 2017-02-03 DIAGNOSIS — I5023 Acute on chronic systolic (congestive) heart failure: Secondary | ICD-10-CM | POA: Diagnosis not present

## 2017-02-03 DIAGNOSIS — J9601 Acute respiratory failure with hypoxia: Secondary | ICD-10-CM | POA: Diagnosis present

## 2017-02-03 DIAGNOSIS — E669 Obesity, unspecified: Secondary | ICD-10-CM | POA: Diagnosis present

## 2017-02-03 DIAGNOSIS — N179 Acute kidney failure, unspecified: Secondary | ICD-10-CM | POA: Diagnosis present

## 2017-02-03 DIAGNOSIS — C801 Malignant (primary) neoplasm, unspecified: Secondary | ICD-10-CM | POA: Insufficient documentation

## 2017-02-03 DIAGNOSIS — I2510 Atherosclerotic heart disease of native coronary artery without angina pectoris: Secondary | ICD-10-CM | POA: Diagnosis present

## 2017-02-03 DIAGNOSIS — Z950 Presence of cardiac pacemaker: Secondary | ICD-10-CM

## 2017-02-03 DIAGNOSIS — E039 Hypothyroidism, unspecified: Secondary | ICD-10-CM | POA: Diagnosis present

## 2017-02-03 DIAGNOSIS — G4733 Obstructive sleep apnea (adult) (pediatric): Secondary | ICD-10-CM | POA: Diagnosis not present

## 2017-02-03 DIAGNOSIS — E1165 Type 2 diabetes mellitus with hyperglycemia: Secondary | ICD-10-CM | POA: Diagnosis present

## 2017-02-03 DIAGNOSIS — Z955 Presence of coronary angioplasty implant and graft: Secondary | ICD-10-CM

## 2017-02-03 DIAGNOSIS — I255 Ischemic cardiomyopathy: Secondary | ICD-10-CM | POA: Diagnosis present

## 2017-02-03 DIAGNOSIS — Z791 Long term (current) use of non-steroidal anti-inflammatories (NSAID): Secondary | ICD-10-CM

## 2017-02-03 DIAGNOSIS — Z96652 Presence of left artificial knee joint: Secondary | ICD-10-CM | POA: Diagnosis present

## 2017-02-03 DIAGNOSIS — D638 Anemia in other chronic diseases classified elsewhere: Secondary | ICD-10-CM | POA: Diagnosis present

## 2017-02-03 DIAGNOSIS — Z8249 Family history of ischemic heart disease and other diseases of the circulatory system: Secondary | ICD-10-CM

## 2017-02-03 DIAGNOSIS — Z7982 Long term (current) use of aspirin: Secondary | ICD-10-CM

## 2017-02-03 DIAGNOSIS — Z87891 Personal history of nicotine dependence: Secondary | ICD-10-CM | POA: Diagnosis not present

## 2017-02-03 DIAGNOSIS — Z79899 Other long term (current) drug therapy: Secondary | ICD-10-CM

## 2017-02-03 DIAGNOSIS — Z9119 Patient's noncompliance with other medical treatment and regimen: Secondary | ICD-10-CM

## 2017-02-03 DIAGNOSIS — Z8674 Personal history of sudden cardiac arrest: Secondary | ICD-10-CM

## 2017-02-03 DIAGNOSIS — Z833 Family history of diabetes mellitus: Secondary | ICD-10-CM

## 2017-02-03 DIAGNOSIS — Z882 Allergy status to sulfonamides status: Secondary | ICD-10-CM

## 2017-02-03 DIAGNOSIS — I5043 Acute on chronic combined systolic (congestive) and diastolic (congestive) heart failure: Secondary | ICD-10-CM | POA: Diagnosis present

## 2017-02-03 DIAGNOSIS — Z6836 Body mass index (BMI) 36.0-36.9, adult: Secondary | ICD-10-CM

## 2017-02-03 DIAGNOSIS — Z794 Long term (current) use of insulin: Secondary | ICD-10-CM

## 2017-02-03 DIAGNOSIS — N183 Chronic kidney disease, stage 3 (moderate): Secondary | ICD-10-CM | POA: Diagnosis not present

## 2017-02-03 HISTORY — DX: Essential (primary) hypertension: I10

## 2017-02-03 HISTORY — DX: Heart failure, unspecified: I50.9

## 2017-02-03 HISTORY — DX: Dyspnea, unspecified: R06.00

## 2017-02-03 HISTORY — DX: Presence of cardiac pacemaker: Z95.0

## 2017-02-03 HISTORY — DX: Disease of blood and blood-forming organs, unspecified: D75.9

## 2017-02-03 HISTORY — DX: Adverse effect of unspecified anesthetic, initial encounter: T41.45XA

## 2017-02-03 HISTORY — DX: Unspecified osteoarthritis, unspecified site: M19.90

## 2017-02-03 HISTORY — DX: Hypothyroidism, unspecified: E03.9

## 2017-02-03 HISTORY — DX: Other complications of anesthesia, initial encounter: T88.59XA

## 2017-02-03 LAB — TROPONIN I
TROPONIN I: 0.09 ng/mL — AB (ref <0.03)
Troponin I: 0.09 ng/mL (ref <0.03)

## 2017-02-03 LAB — COMPREHENSIVE METABOLIC PANEL
ALT: 35 U/L (ref 14–54)
AST: 34 U/L (ref 15–41)
Albumin: 4 g/dL (ref 3.5–5.0)
Alkaline Phosphatase: 55 U/L (ref 38–126)
Anion gap: 10 (ref 5–15)
BUN: 24 mg/dL — ABNORMAL HIGH (ref 6–20)
CHLORIDE: 105 mmol/L (ref 101–111)
CO2: 26 mmol/L (ref 22–32)
CREATININE: 1.2 mg/dL — AB (ref 0.44–1.00)
Calcium: 8.9 mg/dL (ref 8.9–10.3)
GFR, EST AFRICAN AMERICAN: 52 mL/min — AB (ref >60)
GFR, EST NON AFRICAN AMERICAN: 45 mL/min — AB (ref >60)
Glucose, Bld: 129 mg/dL — ABNORMAL HIGH (ref 65–99)
POTASSIUM: 3.8 mmol/L (ref 3.5–5.1)
SODIUM: 141 mmol/L (ref 135–145)
Total Bilirubin: 0.9 mg/dL (ref 0.3–1.2)
Total Protein: 7 g/dL (ref 6.5–8.1)

## 2017-02-03 LAB — CBC WITH DIFFERENTIAL/PLATELET
BASOS PCT: 0 %
Basophils Absolute: 0 10*3/uL (ref 0.0–0.1)
Eosinophils Absolute: 0.2 10*3/uL (ref 0.0–0.7)
Eosinophils Relative: 2 %
HCT: 30.7 % — ABNORMAL LOW (ref 36.0–46.0)
Hemoglobin: 9.8 g/dL — ABNORMAL LOW (ref 12.0–15.0)
LYMPHS ABS: 3 10*3/uL (ref 0.7–4.0)
Lymphocytes Relative: 33 %
MCH: 21.2 pg — AB (ref 26.0–34.0)
MCHC: 31.9 g/dL (ref 30.0–36.0)
MCV: 66.3 fL — AB (ref 78.0–100.0)
MONO ABS: 0.5 10*3/uL (ref 0.1–1.0)
Monocytes Relative: 6 %
NEUTROS PCT: 59 %
Neutro Abs: 5.3 10*3/uL (ref 1.7–7.7)
PLATELETS: 269 10*3/uL (ref 150–400)
RBC: 4.63 MIL/uL (ref 3.87–5.11)
RDW: 16.4 % — ABNORMAL HIGH (ref 11.5–15.5)
WBC: 9 10*3/uL (ref 4.0–10.5)

## 2017-02-03 LAB — I-STAT TROPONIN, ED
TROPONIN I, POC: 0.01 ng/mL (ref 0.00–0.08)
Troponin i, poc: 0.05 ng/mL (ref 0.00–0.08)

## 2017-02-03 LAB — GLUCOSE, CAPILLARY
GLUCOSE-CAPILLARY: 190 mg/dL — AB (ref 65–99)
Glucose-Capillary: 163 mg/dL — ABNORMAL HIGH (ref 65–99)
Glucose-Capillary: 182 mg/dL — ABNORMAL HIGH (ref 65–99)

## 2017-02-03 LAB — I-STAT CG4 LACTIC ACID, ED
LACTIC ACID, VENOUS: 1.93 mmol/L — AB (ref 0.5–1.9)
Lactic Acid, Venous: 1.79 mmol/L (ref 0.5–1.9)

## 2017-02-03 LAB — BRAIN NATRIURETIC PEPTIDE: B Natriuretic Peptide: 199.2 pg/mL — ABNORMAL HIGH (ref 0.0–100.0)

## 2017-02-03 LAB — CBG MONITORING, ED: GLUCOSE-CAPILLARY: 104 mg/dL — AB (ref 65–99)

## 2017-02-03 MED ORDER — INSULIN GLARGINE 100 UNIT/ML ~~LOC~~ SOLN
30.0000 [IU] | Freq: Every day | SUBCUTANEOUS | Status: DC
  Administered 2017-02-04 – 2017-02-06 (×3): 30 [IU] via SUBCUTANEOUS
  Filled 2017-02-03 (×4): qty 0.3

## 2017-02-03 MED ORDER — METFORMIN HCL 500 MG PO TABS
500.0000 mg | ORAL_TABLET | Freq: Every day | ORAL | Status: DC
  Administered 2017-02-04: 500 mg via ORAL
  Filled 2017-02-03: qty 1

## 2017-02-03 MED ORDER — INSULIN ASPART 100 UNIT/ML ~~LOC~~ SOLN: 0.0000 [IU] | Freq: Every day | SUBCUTANEOUS | Status: DC

## 2017-02-03 MED ORDER — LEVOTHYROXINE SODIUM 125 MCG PO TABS
125.0000 ug | ORAL_TABLET | Freq: Every day | ORAL | Status: DC
  Administered 2017-02-04 – 2017-02-06 (×3): 125 ug via ORAL
  Filled 2017-02-03 (×3): qty 1

## 2017-02-03 MED ORDER — SODIUM CHLORIDE 0.9% FLUSH
3.0000 mL | Freq: Two times a day (BID) | INTRAVENOUS | Status: DC
  Administered 2017-02-03 – 2017-02-06 (×7): 3 mL via INTRAVENOUS

## 2017-02-03 MED ORDER — ACETAMINOPHEN 325 MG PO TABS: 650.0000 mg | ORAL_TABLET | ORAL | Status: DC | PRN

## 2017-02-03 MED ORDER — SODIUM CHLORIDE 0.9 % IV SOLN: 250.0000 mL | INTRAVENOUS | Status: DC | PRN

## 2017-02-03 MED ORDER — SODIUM CHLORIDE 0.9% FLUSH
3.0000 mL | INTRAVENOUS | Status: DC | PRN
  Administered 2017-02-03 – 2017-02-04 (×2): 3 mL via INTRAVENOUS
  Filled 2017-02-03 (×2): qty 3

## 2017-02-03 MED ORDER — INSULIN ASPART 100 UNIT/ML ~~LOC~~ SOLN
0.0000 [IU] | Freq: Three times a day (TID) | SUBCUTANEOUS | Status: DC
  Administered 2017-02-03: 2 [IU] via SUBCUTANEOUS
  Administered 2017-02-04 – 2017-02-06 (×5): 1 [IU] via SUBCUTANEOUS

## 2017-02-03 MED ORDER — ASPIRIN EC 81 MG PO TBEC
81.0000 mg | DELAYED_RELEASE_TABLET | Freq: Every day | ORAL | Status: DC
  Administered 2017-02-03 – 2017-02-06 (×4): 81 mg via ORAL
  Filled 2017-02-03 (×4): qty 1

## 2017-02-03 MED ORDER — INSULIN ASPART 100 UNIT/ML ~~LOC~~ SOLN
5.0000 [IU] | Freq: Every day | SUBCUTANEOUS | Status: DC
  Administered 2017-02-04 – 2017-02-05 (×2): 5 [IU] via SUBCUTANEOUS

## 2017-02-03 MED ORDER — FUROSEMIDE 10 MG/ML IJ SOLN
60.0000 mg | Freq: Once | INTRAMUSCULAR | Status: AC
  Administered 2017-02-03: 60 mg via INTRAVENOUS
  Filled 2017-02-03: qty 6

## 2017-02-03 MED ORDER — METFORMIN HCL 500 MG PO TABS
1000.0000 mg | ORAL_TABLET | Freq: Every day | ORAL | Status: DC
  Administered 2017-02-03: 1000 mg via ORAL
  Filled 2017-02-03: qty 2

## 2017-02-03 MED ORDER — POTASSIUM CHLORIDE CRYS ER 20 MEQ PO TBCR
40.0000 meq | EXTENDED_RELEASE_TABLET | Freq: Two times a day (BID) | ORAL | Status: DC
  Administered 2017-02-03 – 2017-02-05 (×6): 40 meq via ORAL
  Filled 2017-02-03 (×6): qty 2

## 2017-02-03 MED ORDER — INSULIN GLARGINE 100 UNIT/ML ~~LOC~~ SOLN
40.0000 [IU] | Freq: Every day | SUBCUTANEOUS | Status: DC
  Administered 2017-02-03 – 2017-02-05 (×3): 40 [IU] via SUBCUTANEOUS
  Filled 2017-02-03 (×5): qty 0.4

## 2017-02-03 MED ORDER — ENOXAPARIN SODIUM 40 MG/0.4ML ~~LOC~~ SOLN
40.0000 mg | SUBCUTANEOUS | Status: DC
  Administered 2017-02-03 – 2017-02-05 (×3): 40 mg via SUBCUTANEOUS
  Filled 2017-02-03 (×3): qty 0.4

## 2017-02-03 MED ORDER — SPIRONOLACTONE 25 MG PO TABS
25.0000 mg | ORAL_TABLET | ORAL | Status: DC
  Administered 2017-02-04 – 2017-02-06 (×2): 25 mg via ORAL
  Filled 2017-02-03 (×3): qty 1

## 2017-02-03 MED ORDER — ATORVASTATIN CALCIUM 20 MG PO TABS
20.0000 mg | ORAL_TABLET | Freq: Every day | ORAL | Status: DC
  Administered 2017-02-03 – 2017-02-05 (×3): 20 mg via ORAL
  Filled 2017-02-03 (×3): qty 1

## 2017-02-03 MED ORDER — INSULIN GLARGINE 100 UNIT/ML ~~LOC~~ SOLN: 30.0000 [IU] | Freq: Two times a day (BID) | SUBCUTANEOUS | Status: DC

## 2017-02-03 MED ORDER — IPRATROPIUM-ALBUTEROL 0.5-2.5 (3) MG/3ML IN SOLN
3.0000 mL | Freq: Once | RESPIRATORY_TRACT | Status: AC
  Administered 2017-02-03: 3 mL via RESPIRATORY_TRACT
  Filled 2017-02-03: qty 3

## 2017-02-03 MED ORDER — FUROSEMIDE 10 MG/ML IJ SOLN
60.0000 mg | Freq: Two times a day (BID) | INTRAMUSCULAR | Status: DC
  Administered 2017-02-03 – 2017-02-04 (×3): 60 mg via INTRAVENOUS
  Filled 2017-02-03 (×3): qty 6

## 2017-02-03 MED ORDER — METFORMIN HCL 500 MG PO TABS: 500.0000 mg | ORAL_TABLET | Freq: Two times a day (BID) | ORAL | Status: DC

## 2017-02-03 MED ORDER — ONDANSETRON HCL 4 MG/2ML IJ SOLN: 4.0000 mg | Freq: Four times a day (QID) | INTRAMUSCULAR | Status: DC | PRN

## 2017-02-03 MED ORDER — IRBESARTAN 75 MG PO TABS
75.0000 mg | ORAL_TABLET | Freq: Every day | ORAL | Status: DC
  Administered 2017-02-03 – 2017-02-05 (×3): 75 mg via ORAL
  Filled 2017-02-03 (×3): qty 1

## 2017-02-03 NOTE — ED Provider Notes (Signed)
[facility] DEPT Provider Note   CSN: [id number] Arrival date & time: 02/03/17  2446     History   Chief Complaint Chief Complaint  Patient presents with  . Shortness of Breath    HPI Danielle [name] is a 72 y.o. female.  HPI Danielle [name] is a 71 y.o. female  ith history of hypertension, diabetes, coronary disease, hypothyroidism, presents to emergency department complaining of shortness of breath. Patient states that the shortness of breath woke her up from sleep. She states this has been happening over the last several weeks. She states she is supposed to be wearing a C-pap machine but she has not been. She states this morning she woke up she started feeling lightheaded, diaphoretic, and dizzy, she felt her sugar was dropping so she went to the kitchen to get some food. She is able to eat a banana and drink some juice. She states her shortness of breath and weakness, worse or she called EMS. Upon EMS arrival, patient's oxygen was 80% on room air. Patient was very diaphoretic, was having difficulty providing history. She was initially started on oxygen, but because she was poorly responsive was switched to CPAP. Patient states she's feeling a little better now, denies any chest pain, reports some shortness of breath. Reports cough, congestion, swelling.  Past Medical History:  Diagnosis Date  . Cancer ([place])    breast  . Diabetes mellitus without complication ([place])   . Heart disease   . Hypertension   . Thyroid disease    hypothyroid    There are no active problems to display for this patient.   Past Surgical History:  Procedure Laterality Date  . BREAST LUMPECTOMY WITH NEEDLE LOCALIZATION AND AXILLARY LYMPH NODE DISSECTION  1985  . CORONARY STENT PLACEMENT  2010, 2012    2 done 2010 and 1 replaced 2012  . REPLACEMENT TOTAL KNEE Left 09/2012  . THYROIDECTOMY, PARTIAL  1978    OB History    No data available       Home Medications    Prior to Admission medications     Medication Sig Start Date End Date Taking? Authorizing Provider  Alpha Lipoic Acid 200 MG CAPS Take by mouth.    [provider]  antipyrine-benzocaine Toniann Fail) otic solution Place 3-4 drops into both ears every 2 (two) hours as needed for ear pain. 11/24/13   Gregor Hams, MD  aspirin 81 MG tablet Take 81 mg by mouth daily.    [provider]  atorvastatin (LIPITOR) 20 MG tablet Take 20 mg by mouth at bedtime.    [provider]  calcium citrate (CALCITRATE - DOSED IN MG ELEMENTAL CALCIUM) 950 MG tablet Take 1 tablet by mouth daily.    [provider]  carvedilol (COREG CR) 40 MG 24 hr capsule Take 40 mg by mouth daily.    [provider]  clopidogrel (PLAVIX) 75 MG tablet Take 75 mg by mouth daily.    [provider]  Cod Liver Oil OIL Take 5 mLs by mouth every morning.    [provider]  diazepam (VALIUM) 5 MG tablet Take 5 mg by mouth every 8 (eight) hours as needed for anxiety.    [provider]  fish oil-omega-3 fatty acids 1000 MG capsule Take 1 g by mouth daily.    [provider]  folic acid (FOLVITE) 1 MG tablet Take 1 mg by mouth daily.    [provider]  glucosamine-chondroitin 500-400 MG tablet Take 1  tablet by mouth 2 (two) times daily.    [provider]  HYDROcodone-acetaminophen (NORCO) 10-325 MG per tablet Take 1 tablet by mouth every 6 (six) hours as needed for pain. 12/22/12   Lanae Crumbly, PA-C  insulin glargine (LANTUS) 100 UNIT/ML injection Inject 45 Units into the skin 2 (two) times daily before a meal.     [provider]  insulin lispro (HUMALOG) 100 UNIT/ML injection Inject 10 Units into the skin 3 (three) times daily before meals.    [provider]  isosorbide mononitrate (IMDUR) 30 MG 24 hr tablet Take 30 mg by mouth daily.    [provider]  levothyroxine (SYNTHROID, LEVOTHROID) 125 MCG tablet Take 125 mcg by mouth daily before  breakfast.    [provider]  Magnesium Oxide 500 MG CAPS Take by mouth.    [provider]  meloxicam (MOBIC) 7.5 MG tablet Take 7.5 mg by mouth daily.    [provider]  Multiple Vitamin (MULTIVITAMIN) tablet Take 1 tablet by mouth daily.    [provider]  NAPROXEN PO Take 500 mg by mouth 2 (two) times daily.    [provider]  ofloxacin (FLOXIN) 0.3 % otic solution Place 10 drops into both ears daily. X 7 days 12/08/13   Lutricia Feil, PA  oxyCODONE-acetaminophen (PERCOCET/ROXICET) 5-325 MG per tablet Take 1 tablet by mouth every 4 (four) hours as needed for pain.    [provider]  spironolactone (ALDACTONE) 25 MG tablet Take 25 mg by mouth daily.    [provider]  torsemide (DEMADEX) 20 MG tablet Take 20 mg by mouth daily.    [provider]  valsartan (DIOVAN) 40 MG tablet Take 40 mg by mouth 2 (two) times daily.     [provider]    Family History Family History  Problem Relation Age of Onset  . Multiple myeloma Mother   . Heart disease Father     Social History Social History  Substance Use Topics  . Smoking status: Never Smoker  . Smokeless tobacco: Never Used  . Alcohol use Yes     Comment: ocassional     Allergies   Sulfa antibiotics   Review of Systems Review of Systems  Constitutional: Negative for chills and fever.  Respiratory: Positive for cough and shortness of breath. Negative for chest tightness.   Cardiovascular: Negative for chest pain, palpitations and leg swelling.  Gastrointestinal: Negative for abdominal pain, diarrhea, nausea and vomiting.  Genitourinary: Negative for dysuria, flank pain and pelvic pain.  Musculoskeletal: Negative for arthralgias, myalgias, neck pain and neck stiffness.  Skin: Negative for rash.  Neurological: Negative for dizziness, weakness and headaches.  All other systems reviewed and are negative.    Physical Exam Updated  Vital Signs BP (!) 161/91   Pulse 88   Temp 98.5 F (36.9 C) (Oral)   Resp (!) 25   SpO2 100%   Physical Exam  Constitutional: She appears well-developed and well-nourished. No distress.  HENT:  Head: Normocephalic and atraumatic.  Eyes: Conjunctivae are normal.  Neck: Neck supple.  Cardiovascular: Normal rate, regular rhythm and normal heart sounds.   Pulmonary/Chest: She has no wheezes.  Tacheipneic, speaking full sentences, rales at bases  Abdominal: Soft. Bowel sounds are normal. She exhibits no distension. There is no tenderness. There is no rebound.  Musculoskeletal: She exhibits edema.  Neurological: She is alert.  Skin: Skin is warm and dry.  Psychiatric: She has a normal  mood and affect. Her behavior is normal.  Nursing note and vitals reviewed.    ED Treatments / Results  Labs (all labs ordered are listed, but only abnormal results are displayed) Labs Reviewed  CBC WITH DIFFERENTIAL/PLATELET - Abnormal; Notable for the following:       Result Value   Hemoglobin 9.8 (*)    HCT 30.7 (*)    MCV 66.3 (*)    MCH 21.2 (*)    RDW 16.4 (*)    All other components within normal limits  COMPREHENSIVE METABOLIC PANEL - Abnormal; Notable for the following:    Glucose, Bld 129 (*)    BUN 24 (*)    Creatinine, Ser 1.20 (*)    GFR calc non Af Amer 45 (*)    GFR calc Af Amer 52 (*)    All other components within normal limits  BRAIN NATRIURETIC PEPTIDE - Abnormal; Notable for the following:    B Natriuretic Peptide 199.2 (*)    All other components within normal limits  I-STAT CG4 LACTIC ACID, ED - Abnormal; Notable for the following:    Lactic Acid, Venous 1.93 (*)    All other components within normal limits  CBG MONITORING, ED - Abnormal; Notable for the following:    Glucose-Capillary 104 (*)    All other components within normal limits  I-STAT TROPONIN, ED    EKG  EKG Interpretation  Date/Time:  Sunday February 03 2017 06:44:42 EDT Ventricular Rate:   84 PR Interval:    QRS Duration: 99 QT Interval:  427 QTC Calculation: 505 R Axis:   26 Text Interpretation:  Sinus rhythm Probable left ventricular hypertrophy Abnormal inferior Q waves Anterior Q waves, possibly due to LVH Prolonged QT interval No old tracing to compare Confirmed by Jola Schmidt [phone]) on 02/03/2017 8:47:01 AM       Radiology Dg Chest Port 1 View  Result Date: 02/03/2017 CLINICAL DATA:  Sudden onset shortness of breath this morning. Crackles in the lung bases. Decreased oxygen saturation. EXAM: PORTABLE CHEST 1 VIEW COMPARISON:  None. FINDINGS: Cardiac pacemaker. Shallow inspiration. Cardiac enlargement with mild pulmonary vascular congestion. Infiltrates in the lung bases likely to represent edema although multifocal pneumonia could also have this appearance. No blunting of costophrenic angles. No pneumothorax. Calcification of the aorta. Old right rib fractures. Degenerative changes in the spine. IMPRESSION: Cardiac enlargement with mild pulmonary vascular congestion and bilateral basilar edema. Electronically Signed   By: Lucienne Capers M.D.   On: 02/03/2017 06:54    Procedures Procedures (including critical care time)  Medications Ordered in ED Medications  ipratropium-albuterol (DUONEB) 0.5-2.5 (3) MG/3ML nebulizer solution 3 mL (not administered)     Initial Impression / Assessment and Plan / ED Course  I have reviewed the triage vital signs and the nursing notes.  Pertinent labs & imaging results that were available during my care of the patient were reviewed by me and considered in my medical decision making (see chart for details).     Pt seen and examined, she was taken of bipap, she is speaking full sentences, she is still appears short of breath, but much more responsive and able to speak full sentences. Positive for accessory muscle use. Rales at bases, otherwise normal lung exam. Will get labs, CXR, will try duoneb. Will place on nasal cannula and  monitor.   8:43 AM CXR showing Mild pulmonary vascular congestion and bilateral basilar edema. Lasix 98m ordered. Pt is feeling much better. She is in no respiratory  distress. Normal respiratory effort. Will call and get pt admitted for further work up and treatment.   Spoke with [facility], will admit.   Vitals:   02/03/17 0620 02/03/17 0635 02/03/17 0700 02/03/17 0730  BP:  (!) 161/91 (!) 166/88 (!) 148/95  Pulse:  88 85 78  Resp:  (!) 25 (!) 28 (!) 21  Temp:  98.5 F (36.9 C)    TempSrc:  Oral    SpO2: (!) 80% 100% 99% 100%     Final Clinical Impressions(s) / ED Diagnoses   Final diagnoses:  Shortness of breath  Pulmonary vascular congestion    New Prescriptions New Prescriptions   No medications on file     Jeannett Senior, Hershal Coria 02/03/17 [place], Kevin, MD 02/07/17 1124

## 2017-02-03 NOTE — H&P (Signed)
History and Physical  Breeze Berringer SNK:[id number] DOB: 1945/07/04 DOA: 02/03/2017  Referring physician: Dr Venora Maples, ED physician PCP: Seward Carol, MD  Outpatient Specialists: none  Patient Coming From: home  Chief Complaint: SOB  HPI: Danielle [name] is a 71 y.o. female with a history of hypertension, diabetes on insulin, hypothyroidism, chronic pain, objective sleep apnea. Patient presents with acute onset of shortness of breath that woke her up from sleep. She has been noncompliant with her C Pap machine for her obstructive sleep apnea. She started feeling lightheaded, diaphoretic, dizzy, similar to when her blood sugar is low. She ate a banana and had some juice. She continued to be short of breath and weak and called EMS. On arrival, the patient's blood sugar was normal, but the patient had an oxygen saturation of 80% on room air. As she was still diaphoretic, the patient was placed on C Pap and brought into the emergency department for evaluation. The patient has been weaned off from C Pap and is currently on nasal cannula. She denies chest pain. No other palliating or provoking factors. Symptoms are improving. Never had an episode of this before. Has been having orthopnea over the past several days with lower extremity edema.  He did just recently moved to the [place] area from [place]. She has not [facility] care with her PCP here, but does have an appointment with him  Emergency Department Course: Troponin normal. Chest x-ray shows mild vascular congestion pulmonary edema. BNP elevated at 199.   Review of Systems:   Pt denies any fevers, chills, nausea, vomiting, diarrhea, constipation, abdominal pain, cough, wheezing, palpitations, headache, vision changes, lightheadedness, dizziness, melena, rectal bleeding.  Review of systems are otherwise negative  Past Medical History:  Diagnosis Date  . Cancer ([place])    breast  . Diabetes mellitus without complication ([place])   . Essential  hypertension   . Heart disease   . Thyroid disease    hypothyroid   Past Surgical History:  Procedure Laterality Date  . BREAST LUMPECTOMY WITH NEEDLE LOCALIZATION AND AXILLARY LYMPH NODE DISSECTION  1985  . CORONARY STENT PLACEMENT  2010, 2012    2 done 2010 and 1 replaced 2012  . REPLACEMENT TOTAL KNEE Left 09/2012  . THYROIDECTOMY, PARTIAL  1978   Social History:  reports that she has never smoked. She has never used smokeless tobacco. She reports that she drinks alcohol. She reports that she does not use drugs. Patient lives at home  Allergies  Allergen Reactions  . Sulfa Antibiotics Rash    Family History  Problem Relation Age of Onset  . Multiple myeloma Mother   . Heart disease Father       Prior to Admission medications   Medication Sig Start Date End Date Taking? Authorizing Provider  Alpha Lipoic Acid 200 MG CAPS Take by mouth.    [provider]  antipyrine-benzocaine Toniann Fail) otic solution Place 3-4 drops into both ears every 2 (two) hours as needed for ear pain. 11/24/13   Gregor Hams, MD  aspirin 81 MG tablet Take 81 mg by mouth daily.    [provider]  atorvastatin (LIPITOR) 20 MG tablet Take 20 mg by mouth at bedtime.    [provider]  calcium citrate (CALCITRATE - DOSED IN MG ELEMENTAL CALCIUM) 950 MG tablet Take 1 tablet by mouth daily.    [provider]  carvedilol (COREG CR) 40 MG 24 hr capsule Take 40 mg by mouth daily.    [provider]  clopidogrel (PLAVIX) 75 MG tablet Take 75 mg by mouth daily.    [provider]  Cod Liver Oil OIL Take 5 mLs by mouth every morning.    [provider]  diazepam (VALIUM) 5 MG tablet Take 5 mg by mouth every 8 (eight) hours as needed for anxiety.    [provider]  fish oil-omega-3 fatty acids 1000 MG capsule Take 1 g by mouth daily.    [provider]  folic acid (FOLVITE) 1 MG tablet Take 1 mg by mouth daily.    [provider]  glucosamine-chondroitin 500-400 MG tablet Take 1 tablet by mouth 2 (two) times daily.    [provider]  HYDROcodone-acetaminophen (NORCO) 10-325 MG per tablet Take 1 tablet by mouth every 6 (six) hours as needed for pain. 12/22/12   Lanae Crumbly, PA-C  insulin glargine (LANTUS) 100 UNIT/ML injection Inject 45 Units into the skin 2 (two) times daily before a meal.     [provider]  insulin lispro (HUMALOG) 100 UNIT/ML injection Inject 10 Units into the skin 3 (three) times daily before meals.    [provider]  isosorbide mononitrate (IMDUR) 30 MG 24 hr tablet Take 30 mg by mouth daily.    [provider]  levothyroxine (SYNTHROID, LEVOTHROID) 125 MCG tablet Take 125 mcg by mouth daily before breakfast.    [provider]  Magnesium Oxide 500 MG CAPS Take by mouth.    [provider]  meloxicam (MOBIC) 7.5 MG tablet Take 7.5 mg by mouth daily.    [provider]  Multiple Vitamin (MULTIVITAMIN) tablet Take 1 tablet by mouth daily.    [provider]  NAPROXEN PO Take 500 mg by mouth 2 (two) times daily.    [provider]  ofloxacin (FLOXIN) 0.3 % otic solution Place 10 drops into both ears daily. X 7 days 12/08/13   Lutricia Feil, PA  oxyCODONE-acetaminophen (PERCOCET/ROXICET) 5-325 MG per tablet Take 1 tablet by mouth every 4 (four) hours as needed for pain.    [provider]  spironolactone (ALDACTONE) 25 MG tablet Take 25 mg by mouth daily.    [provider]  torsemide (DEMADEX) 20 MG tablet Take 20 mg by mouth daily.    [provider]  valsartan (DIOVAN) 40 MG tablet Take 40 mg by mouth 2 (two) times daily.     [provider]    Physical Exam: BP (!) 148/95   Pulse 78   Temp 98.5 F (36.9 C) (Oral)   Resp (!) 21   SpO2 100%   General: Elderly black female. Awake and alert and oriented x3. No acute cardiopulmonary distress.  HEENT:  Normocephalic atraumatic.  Right and left ears normal in appearance.  Pupils equal, round, reactive to light. Extraocular muscles are intact. Sclerae anicteric and noninjected.  Moist mucosal membranes. No mucosal lesions.  Neck: Neck supple without lymphadenopathy. No carotid bruits. No masses palpated.  Cardiovascular: Regular rate with normal S1-S2 sounds. No murmurs, rubs, gallops auscultated. Increased JVD to 9 cm. 2+ pitting edema bilaterally  Respiratory: Rales in bases bilaterally. No accessory muscle use. Abdomen: Soft, nontender, nondistended. Active bowel sounds. No masses or hepatosplenomegaly  Skin: No rashes, lesions, or ulcerations.  Dry, warm to touch. 2+ dorsalis pedis and radial pulses. Musculoskeletal: No calf or leg pain. All major joints not erythematous nontender.  No upper or lower joint deformation.  Good ROM.  No contractures  Psychiatric: Intact judgment and insight. Pleasant and cooperative. Neurologic: No focal neurological deficits. Strength is 5/5 and symmetric in upper and lower extremities.  Cranial nerves II through XII are grossly intact.           Labs on Admission: I have personally reviewed following labs and imaging studies  CBC:  Recent Labs Lab 02/03/17 0630  WBC 9.0  NEUTROABS 5.3  HGB 9.8*  HCT 30.7*  MCV 66.3*  PLT 595   Basic Metabolic Panel:  Recent Labs Lab 02/03/17 0630  NA 141  K 3.8  CL 105  CO2 26  GLUCOSE 129*  BUN 24*  CREATININE 1.20*  CALCIUM 8.9   GFR: CrCl cannot be calculated (Unknown ideal weight.). Liver Function Tests:  Recent Labs Lab 02/03/17 0630  AST 34  ALT 35  ALKPHOS 55  BILITOT 0.9  PROT 7.0  ALBUMIN 4.0   No results for input(s): LIPASE, AMYLASE in the last 168 hours. No results for input(s): AMMONIA in the last 168 hours. Coagulation Profile: No results for input(s): INR, PROTIME in the last 168 hours. Cardiac Enzymes: No results for input(s): CKTOTAL, CKMB, CKMBINDEX, TROPONINI in the  last 168 hours. BNP (last 3 results) No results for input(s): PROBNP in the last 8760 hours. HbA1C: No results for input(s): HGBA1C in the last 72 hours. CBG:  Recent Labs Lab 02/03/17 0758  GLUCAP 104*   Lipid Profile: No results for input(s): CHOL, HDL, LDLCALC, TRIG, CHOLHDL, LDLDIRECT in the last 72 hours. Thyroid Function Tests: No results for input(s): TSH, T4TOTAL, FREET4, T3FREE, THYROIDAB in the last 72 hours. Anemia Panel: No results for input(s): VITAMINB12, FOLATE, FERRITIN, TIBC, IRON, RETICCTPCT in the last 72 hours. Urine analysis: No results found for: COLORURINE, APPEARANCEUR, LABSPEC, PHURINE, GLUCOSEU, HGBUR, BILIRUBINUR, KETONESUR, PROTEINUR, UROBILINOGEN, NITRITE, LEUKOCYTESUR Sepsis Labs: @LABRCNTIP (procalcitonin:4,lacticidven:4) )No results found for this or any previous visit (from the past 240 hour(s)).   Radiological Exams on Admission: Dg Chest Port 1 View  Result Date: 02/03/2017 CLINICAL DATA:  Sudden onset shortness of breath this morning. Crackles in the lung bases. Decreased oxygen saturation. EXAM: PORTABLE CHEST 1 VIEW COMPARISON:  None. FINDINGS: Cardiac pacemaker. Shallow inspiration. Cardiac enlargement with mild pulmonary vascular congestion. Infiltrates in the lung bases likely to represent edema although multifocal pneumonia could also have this appearance. No blunting of costophrenic angles. No pneumothorax. Calcification of the aorta. Old right rib fractures. Degenerative changes in the spine. IMPRESSION: Cardiac enlargement with mild pulmonary vascular congestion and bilateral basilar edema. Electronically Signed   By: Lucienne Capers M.D.   On: 02/03/2017 06:54    EKG: Independently reviewed. Sinus rhythm, LVH, Q waves in the inferior leads. No acute ST changes.  Assessment/Plan: Principal Problem:   Acute respiratory failure with hypoxia (HCC) Active Problems:   Essential hypertension   Diabetes mellitus without complication ([facility])    Thyroid disease   Acute systolic CHF (congestive heart failure) (HCC)   Acute kidney injury ([place])   Lactic acidosis    This patient was discussed with the ED physician, including pertinent vitals, physical exam findings, labs, and imaging.  We also discussed care given by the ED provider.  #1 acute respiratory failure with hypoxia  Admit  Oxygen via nasal cannula #2 acute systolic heart failure  Telemetry monitoring  Strict I/O  Daily Weights  Diuresis: Lasix 60 mg IV twice a day  Potassium: 40 mEq twice a day by mouth  Echo cardiac exam tomorrow  Repeat BMP tomorrow #3 acute kidney injury  Acute versus chronic kidney disease  Repeat creatinine tomorrow #4 lactic acidosis  Repeat normal #5 diabetes  Continue home insulin  Sliding scale insulin with CBGs before meals and daily at bedtime #6 thyroid disease  Continue levothyroxine #7 hypertension  Continue antihypertensive management.  Patient unsure of home medications - pharmacy to consult  DVT prophylaxis: Lovenox Consultants: None Code Status: Full code Family Communication: The  Disposition Plan: Patient to return home following admission   Kamdon Reisig [name] Pager [phone]  If 7PM-7AM, please contact night-coverage [email or web address] Password [email or web address]

## 2017-02-03 NOTE — ED Triage Notes (Signed)
Pt to ED from home by EMS c/o sudden onset of sob this morning with crackles in the lower lobes. Initial sats in the 80s improved with NRB. Hx of HF and stents Denies chest pain.

## 2017-02-03 NOTE — ED Notes (Signed)
Pure wick catheter placed due to pt getting lasix, being sob.

## 2017-02-04 ENCOUNTER — Inpatient Hospital Stay (HOSPITAL_COMMUNITY): Payer: Medicare Other

## 2017-02-04 DIAGNOSIS — D509 Iron deficiency anemia, unspecified: Secondary | ICD-10-CM

## 2017-02-04 DIAGNOSIS — G4733 Obstructive sleep apnea (adult) (pediatric): Secondary | ICD-10-CM

## 2017-02-04 DIAGNOSIS — I5023 Acute on chronic systolic (congestive) heart failure: Secondary | ICD-10-CM

## 2017-02-04 DIAGNOSIS — N183 Chronic kidney disease, stage 3 (moderate): Secondary | ICD-10-CM

## 2017-02-04 DIAGNOSIS — I5043 Acute on chronic combined systolic (congestive) and diastolic (congestive) heart failure: Secondary | ICD-10-CM

## 2017-02-04 LAB — RETICULOCYTES
RBC.: 4.35 MIL/uL (ref 3.87–5.11)
Retic Count, Absolute: 65.3 10*3/uL (ref 19.0–186.0)
Retic Ct Pct: 1.5 % (ref 0.4–3.1)

## 2017-02-04 LAB — GLUCOSE, CAPILLARY
GLUCOSE-CAPILLARY: 86 mg/dL (ref 65–99)
GLUCOSE-CAPILLARY: 140 mg/dL — AB (ref 65–99)
Glucose-Capillary: 146 mg/dL — ABNORMAL HIGH (ref 65–99)
Glucose-Capillary: 114 mg/dL — ABNORMAL HIGH (ref 65–99)

## 2017-02-04 LAB — TROPONIN I: TROPONIN I: 0.07 ng/mL — AB (ref <0.03)

## 2017-02-04 LAB — BASIC METABOLIC PANEL
ANION GAP: 9 (ref 5–15)
BUN: 22 mg/dL — AB (ref 6–20)
CHLORIDE: 102 mmol/L (ref 101–111)
CO2: 28 mmol/L (ref 22–32)
Calcium: 8.9 mg/dL (ref 8.9–10.3)
Creatinine, Ser: 1.18 mg/dL — ABNORMAL HIGH (ref 0.44–1.00)
GFR calc Af Amer: 53 mL/min — ABNORMAL LOW (ref >60)
GFR calc non Af Amer: 46 mL/min — ABNORMAL LOW (ref >60)
Glucose, Bld: 92 mg/dL (ref 65–99)
POTASSIUM: 4 mmol/L (ref 3.5–5.1)
Sodium: 139 mmol/L (ref 135–145)

## 2017-02-04 LAB — VITAMIN B12: VITAMIN B 12: 1060 pg/mL — AB (ref 180–914)

## 2017-02-04 LAB — FERRITIN: Ferritin: 432 ng/mL — ABNORMAL HIGH (ref 11–307)

## 2017-02-04 LAB — IRON AND TIBC
IRON: 50 ug/dL (ref 28–170)
Saturation Ratios: 16 % (ref 10.4–31.8)
TIBC: 309 ug/dL (ref 250–450)
UIBC: 259 ug/dL

## 2017-02-04 LAB — FOLATE: FOLATE: 34 ng/mL (ref >5.9)

## 2017-02-04 MED ORDER — CALCIUM CITRATE 950 (200 CA) MG PO TABS
1.0000 | ORAL_TABLET | Freq: Two times a day (BID) | ORAL | Status: DC
  Administered 2017-02-04 – 2017-02-06 (×5): 200 mg via ORAL
  Filled 2017-02-04 (×5): qty 1

## 2017-02-04 MED ORDER — CARVEDILOL 6.25 MG PO TABS
6.2500 mg | ORAL_TABLET | Freq: Two times a day (BID) | ORAL | Status: DC
Start: 2017-02-04 — End: 2017-02-06
  Administered 2017-02-04 – 2017-02-06 (×4): 6.25 mg via ORAL
  Filled 2017-02-04 (×4): qty 1

## 2017-02-04 MED ORDER — FERROUS SULFATE 325 (65 FE) MG PO TABS
325.0000 mg | ORAL_TABLET | Freq: Two times a day (BID) | ORAL | Status: DC
  Administered 2017-02-04 – 2017-02-06 (×4): 325 mg via ORAL
  Filled 2017-02-04 (×4): qty 1

## 2017-02-04 MED ORDER — TICAGRELOR 90 MG PO TABS
90.0000 mg | ORAL_TABLET | Freq: Two times a day (BID) | ORAL | Status: DC
  Administered 2017-02-04 – 2017-02-06 (×5): 90 mg via ORAL
  Filled 2017-02-04 (×5): qty 1

## 2017-02-04 MED ORDER — LATANOPROST 0.005 % OP SOLN
1.0000 [drp] | Freq: Every day | OPHTHALMIC | Status: DC
  Administered 2017-02-04 – 2017-02-05 (×2): 1 [drp] via OPHTHALMIC
  Filled 2017-02-04: qty 2.5

## 2017-02-04 MED ORDER — PANTOPRAZOLE SODIUM 40 MG PO TBEC
40.0000 mg | DELAYED_RELEASE_TABLET | Freq: Every day | ORAL | Status: DC
  Administered 2017-02-04 – 2017-02-06 (×3): 40 mg via ORAL
  Filled 2017-02-04 (×3): qty 1

## 2017-02-04 MED ORDER — WHITE PETROLATUM GEL
Status: AC
  Administered 2017-02-04: 18:00:00
  Filled 2017-02-04: qty 1

## 2017-02-04 MED ORDER — COLCHICINE 0.6 MG PO TABS
0.6000 mg | ORAL_TABLET | Freq: Two times a day (BID) | ORAL | Status: DC
  Administered 2017-02-04 – 2017-02-06 (×5): 0.6 mg via ORAL
  Filled 2017-02-04 (×5): qty 1

## 2017-02-04 NOTE — Progress Notes (Addendum)
PROGRESS NOTE   Danielle [name]  ELF:[id number]    DOB: 1945/10/07    DOA: 02/03/2017  PCP: Seward Carol, MD   I have briefly reviewed patients previous medical records in [facility].  Brief Narrative:  71 year old female, recently moved to [place], [place] from [place] in May 2018, yet to establish care with PCP/Cardiology (had upcoming appointment with Dr. 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 EMS found her with oxygen saturation of 80% on room air, diaphoresis, placed on CPAP. Admitted for acute hypoxic respiratory failure secondary to acute on chronic systolic CHF. Improving. Cardiology consulted.   Assessment & Plan:   Principal Problem:   Acute respiratory failure with hypoxia (HCC) Active Problems:   Essential hypertension   Diabetes mellitus without complication (HCC)   Thyroid disease   Acute systolic CHF (congestive heart failure) (HCC)   Acute kidney injury (HCC)   Lactic acidosis   1. Acute respiratory failure with hypoxia: Not on home oxygen. Noncompliant with nightly CPAP for a long time. Secondary to decompensated CHF. Briefly required CPAP in ED. Now on nasal cannula oxygen. Wean to room air for oxygen saturation >92%. 2. Acute on chronic systolic CHF due to an ischemic cardiomyopathy: Patient states that she reduced her torsemide dose from twice daily to once daily a year ago.? Compliance. Started on IV Lasix 60 mg 2 times daily. Continued Aldactone 25 MG daily and irbesartan. -2.8 L since admission. Still quite volume overloaded and continue current diuretics. 2-D echo 02/04/17: LVEF 45-50 percent and grade 2 diastolic dysfunction.  Counseled regarding compliance. Improving. Cardiology consultation appreciated. Discussed with cardiology and resumed home dose of carvedilol. 3. CAD status post stenting: Patient reports that she first had 3 stents placed in the heart but then went into cardiac arrest following knee surgery in 2016, resuscitated, then had 3 more stent placed. No chest pain reported. Continue aspirin, statins, Brillinta and Coreg. Mildly elevated troponin, flat trend most likely due to demand ischemia from acute respiratory failure and CHF. 4. Essential hypertension: Controlled on current regimen. 5. Uncontrolled type II DM: Continue current reduced dose of Lantus and SSI. DC metformin for now. Titrate insulins as needed. 6. Hypothyroid: Clinically euthyroid. Continue Synthroid. 7. Status post permanent pacemaker: Per cardiology. 8. OSA: Has been noncompliant with nightly CPAP. Needs to be compliant. Counseled. 9. Microcytic Anemia: Follow CBCs. May be related to chronic disease but rule out iron deficiency. Check anemia panel. Continue iron supplements.  10. Stage III chronic kidney disease: Baseline creatinine not known. Follow creatinine closely.  DVT prophylaxis:  Lovenox  Code Status:  Full Family Communication:  Discussed in detail with patient's daughter at bedside. 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 Updated care and answered questions. Disposition:  DC home when medically improved.   Consultants:  Cardiology   Procedures:  2-D echo  Antimicrobials:  None    Subjective:  Feels much better compared to admission. Never had chest pain. Dyspnea improved but breathing not yet at baseline. Leg swelling improved. No cough.   ROS:  No dizziness, lightheadedness or palpitations.  Objective:  Vitals:   02/03/17 1822 02/03/17 2058 02/04/17 0542 02/04/17 0840  BP: Marland Kitchen)  116/52 125/85 129/73 (!) 144/64  Pulse: 76 78 81 74  Resp: 20 19 20 19   Temp: 98.6 F (37 C) 98.5 F (36.9 C) 98.2 F (36.8  C) 98.4 F (36.9 C)  TempSrc: Oral Oral Oral Oral  SpO2: 100% 100% 100% 100%  Weight:  97.7 kg (215 lb 6.2 oz)      Examination:  General exam: Pleasant elderly female, moderately built and obese, lying comfortably propped up in bed. Respiratory system: Reduced breath sounds in the bases with few bibasal crackles. Rest of lung fields clear to auscultation. Respiratory effort normal. Cardiovascular system: S1 & S2 heard, RRR. 2 x 6 systolic murmur heard at apex. JVD +. 1+ pitting bilateral leg edema. Gastrointestinal system: Abdomen is nondistended, soft and nontender. No organomegaly or masses felt. Normal bowel sounds heard. Central nervous system: Alert and oriented. No focal neurological deficits. Extremities: Symmetric 5 x 5 power. Skin: No rashes, lesions or ulcers Psychiatry: Judgement and insight appear normal. Mood & affect appropriate.     Data Reviewed: I have personally reviewed following labs and imaging studies  CBC:  Recent Labs Lab 02/03/17 0630  WBC 9.0  NEUTROABS 5.3  HGB 9.8*  HCT 30.7*  MCV 66.3*  PLT 761   Basic Metabolic Panel:  Recent Labs Lab 02/03/17 0630 02/04/17 0122  NA 141 139  K 3.8 4.0  CL 105 102  CO2 26 28  GLUCOSE 129* 92  BUN 24* 22*  CREATININE 1.20* 1.18*  CALCIUM 8.9 8.9   Liver Function Tests:  Recent Labs Lab 02/03/17 0630  AST 34  ALT 35  ALKPHOS 55  BILITOT 0.9  PROT 7.0  ALBUMIN 4.0   Coagulation Profile: No results for input(s): INR, PROTIME in the last 168 hours. Cardiac Enzymes:  Recent Labs Lab 02/03/17 1438 02/03/17 1908 02/04/17 0122  TROPONINI 0.09* 0.09* 0.07*   HbA1C: No results for input(s): HGBA1C in the last 72 hours. CBG:  Recent Labs Lab 02/03/17 1159 02/03/17 1745 02/03/17 2051 02/04/17 0817 02/04/17 1155  GLUCAP 163* 182* 190* 86 146*    No results found for this or any previous visit (from the past 240 hour(s)).       Radiology Studies: Dg Chest Port 1  View  Result Date: 02/03/2017 CLINICAL DATA:  Sudden onset shortness of breath this morning. Crackles in the lung bases. Decreased oxygen saturation. EXAM: PORTABLE CHEST 1 VIEW COMPARISON:  None. FINDINGS: Cardiac pacemaker. Shallow inspiration. Cardiac enlargement with mild pulmonary vascular congestion. Infiltrates in the lung bases likely to represent edema although multifocal pneumonia could also have this appearance. No blunting of costophrenic angles. No pneumothorax. Calcification of the aorta. Old right rib fractures. Degenerative changes in the spine. IMPRESSION: Cardiac enlargement with mild pulmonary vascular congestion and bilateral basilar edema. Electronically Signed   By: Lucienne Capers M.D.   On: 02/03/2017 06:54        Scheduled Meds: . aspirin EC  81 mg Oral Daily  . atorvastatin  20 mg Oral QHS  . enoxaparin (LOVENOX) injection  40 mg Subcutaneous Q24H  . furosemide  60 mg Intravenous BID  . insulin aspart  0-5 Units Subcutaneous QHS  . insulin aspart  0-9 Units Subcutaneous TID WC  . insulin aspart  5 Units Subcutaneous Q lunch  . insulin glargine  30 Units Subcutaneous Daily  . insulin glargine  40 Units Subcutaneous Q1400  . irbesartan  75 mg Oral Daily  . levothyroxine  125 mcg Oral QAC breakfast  . metFORMIN  1,000 mg Oral Q supper  . metFORMIN  500 mg Oral Q breakfast  . potassium chloride  40 mEq Oral BID  . sodium chloride flush  3 mL Intravenous Q12H  . spironolactone  25 mg Oral QODAY   Continuous Infusions: . sodium chloride       LOS: 1 day     Mariely Mahr, MD, FACP, FHM. [facility] Pager [phone]  If 7PM-7AM, please contact night-coverage [email or web address] Password [email or web address] 02/04/2017, 3:19 PM

## 2017-02-04 NOTE — Consult Note (Signed)
Advanced Heart Failure Team Consult Note   Primary Physician: Dr. Delfina Redwood Primary Cardiologist:  None  Reason for Consultation: SOB  HPI:    Danielle [name] is seen today for evaluation of SOB at the request of Dr. Algis Liming.   Danielle [name] is a 71 y.o. female with h/o Systolic CHF, HTN, CAD, Diabetes, Hypothyroidism, Chronic pain, and OSA.   Pt presented to [facility] 02/03/17 with SOB that woke her up from sleep. Reports non-compliance with her CPAP. Reported lightheadedness, diaphoresis, and SOB, similar to when her sugar is low. BG normal on EMS arrival but 02 sats in 80% on RA. Pt placed on CPAP and brought to ED. Weaned to [place]. CXR shows mild vascular congestion/pulmonary edema.  BNP elevated at 199. Other pertinent labs on admission include Cr 1.2, K 3.8, Hgb 9.8, WBC 9.0  She recently moved from [place] to [place] to be with family.   She states she has been gradually feeling worse for about 2 weeks. Denies fevers or chills, but has been waking up in the night SOB and sweating. She has been non-compliance with her CPAP for an undisclosed amount of time. She denies chest pain, and states she has never had chest pain despite multiple stents.  Does not weight daily. She sleeps on 3 pillows or a wedge. She denies lightheadedness or dizziness. She has a pacemaker for "slow heart rate" and was set to establish with Dr. Curt Bears in the coming weeks. She feels better since admit with IV lasix. She feels like her UO has tapered off at home on torsemide. She has been living with her daughter here in [place] for ~ 1 month. She has a distant history of smoking (Stopped when she was in her late 37s) 10-15 pack year history.   She is negative 2.5 L total on IV lasix. No weight recorded this am. Creatinine stable.   Review of Systems: [y] = yes, _0  = no   General: Weight gain _1 ; Weight loss _2 ; Anorexia _3 ; Fatigue _4 ; Fever _5 ; Chills _6 ; Weakness _7   Cardiac: Chest pain/pressure _8 ; Resting SOB [y];  Exertional SOB [y]; Orthopnea _9 ; Pedal Edema [y]; Palpitations _10 ; Syncope _11 ; Presyncope _12 ; Paroxysmal nocturnal dyspnea_13   Pulmonary: Cough _14 ; Wheezing_15 ; Hemoptysis_16 ; Sputum _17 ; Snoring _18   GI: Vomiting_19 ; Dysphagia_20 ; Melena_21 ; Hematochezia _22 ; Heartburn_23 ; Abdominal pain _24 ; Constipation _25 ; Diarrhea _26 ; BRBPR _27   GU: Hematuria_28 ; Dysuria _29 ; Nocturia_30   Vascular: Pain in legs with walking _31 ; Pain in feet with lying flat _32 ; Non-healing sores _33 ; Stroke _34 ; TIA _35 ; Slurred speech _36 ;  Neuro: Headaches_37 ; Vertigo_38 ; Seizures_39 ; Paresthesias_40 ;Blurred vision _41 ; Diplopia _42 ; Vision changes _43   Ortho/Skin: Arthritis [y]; Joint pain [y]; Muscle pain _44 ; Joint swelling _45 ; Back Pain _46 ; Rash _47   Psych: Depression_48 ; Anxiety_49   Heme: Bleeding problems _50 ; Clotting disorders _51 ; Anemia _52   Endocrine: Diabetes [y]; Thyroid dysfunction_53   Home Medications Prior to Admission medications   Medication Sig Start Date End Date Taking? Authorizing Provider  Ascorbic Acid (VITAMIN C PO) Take 1 tablet by mouth daily.   Yes [provider]  aspirin 81 MG tablet Take 81 mg by mouth daily.   Yes [provider]  atorvastatin (LIPITOR) 80 MG tablet  Take 80 mg by mouth at bedtime.    Yes [provider]  B Complex-C (SUPER B COMPLEX PO) Take 2 tablets by mouth daily.   Yes [provider]  Biotin 10 MG CAPS Take 10 mg by mouth daily.   Yes [provider]  calcium citrate (CALCITRATE - DOSED IN MG ELEMENTAL CALCIUM) 950 MG tablet Take 1 tablet by mouth 2 (two) times daily.    Yes [provider]  carvedilol (COREG) 6.25 MG tablet Take 6.25 mg by mouth 2 (two) times daily with a meal.   Yes [provider]  CHONDROITIN SULFATE PO Take 250 mg by mouth daily.   Yes [provider]  colchicine 0.6 MG tablet Take 0.6 mg by mouth 2 (two) times daily.   Yes [provider]  ferrous  sulfate 325 (65 FE) MG tablet Take 325 mg by mouth 2 (two) times daily with a meal.   Yes [provider]  folic acid (FOLVITE) 1 MG tablet Take 1 mg by mouth daily.   Yes [provider]  Ginkgo Biloba (GNP GINGKO BILOBA EXTRACT PO) Take 2 capsules by mouth daily.   Yes [provider]  insulin glargine (LANTUS) 100 UNIT/ML injection Inject 45-50 Units into the skin See admin instructions. Inject 50 units SQ in the morning and inject 45 units SQ at bedtime   Yes [provider]  insulin lispro (HUMALOG) 100 UNIT/ML injection Inject 10 Units into the skin daily with lunch.    Yes [provider]  Lactobacillus (ACIDOPHILUS PO) Take 1 capsule by mouth daily.   Yes [provider]  levothyroxine (SYNTHROID, LEVOTHROID) 137 MCG tablet Take 137 mcg by mouth daily before breakfast.    Yes [provider]  losartan (COZAAR) 25 MG tablet Take 25 mg by mouth daily.   Yes [provider]  metFORMIN (GLUCOPHAGE) 500 MG tablet Take 500-1,000 mg by mouth See admin instructions. Take 500 mg by mouth in the morning and take 1000 mg by mouth in the evening   Yes [provider]  omeprazole (PRILOSEC) 40 MG capsule Take 40 mg by mouth daily.   Yes [provider]  spironolactone (ALDACTONE) 50 MG tablet Take 50 mg by mouth every other day.    Yes [provider]  ticagrelor (BRILINTA) 90 MG TABS tablet Take 90 mg by mouth 2 (two) times daily.   Yes [provider]  torsemide (DEMADEX) 20 MG tablet Take 40 mg by mouth daily.    Yes [provider]  Travoprost, BAK Free, (TRAVATAN) 0.004 % SOLN ophthalmic solution Place 1 drop into both eyes daily.   Yes [provider]  HYDROcodone-acetaminophen (NORCO) 10-325 MG per tablet Take 1 tablet by mouth every 6 (six) hours as needed for pain. Patient not taking: Reported on 02/03/2017 12/22/12   Herbie Saxon    Past Medical History: Past  Medical History:  Diagnosis Date  . Arthritis   . Blood dyscrasia    per pt-has small blood cells-appears as if anemic  . Cancer (HCC)    breast  . CHF (congestive heart failure) ([place])   . Complication of anesthesia    difficult to awaken from per pt  . Diabetes mellitus without complication ([place])   . Dyspnea   . Essential hypertension   . Heart disease   . Hypothyroidism   . Presence of permanent cardiac pacemaker   . Sleep apnea   . Thyroid disease  hypothyroid    Past Surgical History: Past Surgical History:  Procedure Laterality Date  . BREAST LUMPECTOMY WITH NEEDLE LOCALIZATION AND AXILLARY LYMPH NODE DISSECTION  1985  . BREAST SURGERY Left    partial mastectomy  . CORONARY STENT PLACEMENT  2010, 2012,2017    2 done 2010 and 1 replaced 2012 and 2 replaced in 2017  . EYE SURGERY Bilateral    bilateral lens implant  . INSERT / REPLACE / REMOVE PACEMAKER  07/19/2014  . REPLACEMENT TOTAL KNEE Left 09/2012  . THYROIDECTOMY, PARTIAL  1978  . TONSILLECTOMY      Family History: Family History  Problem Relation Age of Onset  . Multiple myeloma Mother   . Heart disease Father   . Other Sister   . Heart disease Brother   . Diabetes Sister   . Other Sister     Social History: Social History   Social History  . Marital status: Married    Spouse name: N/A  . Number of children: N/A  . Years of education: N/A   Social History Main Topics  . Smoking status: Former Smoker    Packs/day: 1.00    Years: 22.00    Types: Cigarettes    Quit date: 66  . Smokeless tobacco: Never Used  . Alcohol use Yes     Comment: ocassional  . Drug use: No  . Sexual activity: No   Other Topics Concern  . None   Social History Narrative  . None    Allergies:  Allergies  Allergen Reactions  . Sulfa Antibiotics Rash    Objective:    Vital Signs:   Temp:  [98.2 F (36.8 C)-98.6 F (37 C)] 98.4 F (36.9 C) (09/10 0840) Pulse Rate:  [74-81] 74 (09/10 0840) Resp:   [19-20] 19 (09/10 0840) BP: (116-144)/(52-85) 144/64 (09/10 0840) SpO2:  [100 %] 100 % (09/10 0840) Weight:  [215 lb 6.2 oz (97.7 kg)] 215 lb 6.2 oz (97.7 kg) (09/09 2058) Last BM Date: 02/03/17  Weight change: Filed Weights   02/03/17 1208 02/03/17 2058  Weight: 215 lb 11.2 oz (97.8 kg) 215 lb 6.2 oz (97.7 kg)    Intake/Output:   Intake/Output Summary (Last 24 hours) at 02/04/17 1319 Last data filed at 02/04/17 1000  Gross per 24 hour  Intake              720 ml  Output             1900 ml  Net            -1180 ml      Physical Exam    General:  Elderly appearing. NAD.  HEENT: normal Neck: supple. JVP difficult 2/2 body habitus. Appears at least 7-8cm. Carotids 2+ bilat; no bruits. No lymphadenopathy or thyromegaly appreciated. Cor: PMI nondisplaced. Regular rate & rhythm. Mild TR murmur.  Lungs: Distant and diminished. Basilar crackles.  Abdomen: Obese, soft, nontender, nondistended. No hepatosplenomegaly. No bruits or masses. Good bowel sounds. Extremities: No cyanosis, clubbing, or rash. 1+ peripheral edema at least. Neuro: Alert & orientedx3, cranial nerves grossly intact. moves all 4 extremities w/o difficulty. Affect pleasant   Telemetry   NSR 80s, personally reviewed  EKG    NSR 84 bpm, Personally reviewed  Labs   Basic Metabolic Panel:  Recent Labs Lab 02/03/17 0630 02/04/17 0122  NA 141 139  K 3.8 4.0  CL 105 102  CO2 26 28  GLUCOSE 129* 92  BUN 24* 22*  CREATININE 1.20*  1.18*  CALCIUM 8.9 8.9    Liver Function Tests:  Recent Labs Lab 02/03/17 0630  AST 34  ALT 35  ALKPHOS 55  BILITOT 0.9  PROT 7.0  ALBUMIN 4.0   No results for input(s): LIPASE, AMYLASE in the last 168 hours. No results for input(s): AMMONIA in the last 168 hours.  CBC:  Recent Labs Lab 02/03/17 0630  WBC 9.0  NEUTROABS 5.3  HGB 9.8*  HCT 30.7*  MCV 66.3*  PLT 269    Cardiac Enzymes:  Recent Labs Lab 02/03/17 1438 02/03/17 1908 02/04/17 0122    TROPONINI 0.09* 0.09* 0.07*    BNP: BNP (last 3 results)  Recent Labs  02/03/17 0632  BNP 199.2*    ProBNP (last 3 results) No results for input(s): PROBNP in the last 8760 hours.   CBG:  Recent Labs Lab 02/03/17 1159 02/03/17 1745 02/03/17 2051 02/04/17 0817 02/04/17 1155  GLUCAP 163* 182* 190* 86 146*    Coagulation Studies: No results for input(s): LABPROT, INR in the last 72 hours.   Imaging    No results found.   Medications:     Current Medications: . aspirin EC  81 mg Oral Daily  . atorvastatin  20 mg Oral QHS  . enoxaparin (LOVENOX) injection  40 mg Subcutaneous Q24H  . furosemide  60 mg Intravenous BID  . insulin aspart  0-5 Units Subcutaneous QHS  . insulin aspart  0-9 Units Subcutaneous TID WC  . insulin aspart  5 Units Subcutaneous Q lunch  . insulin glargine  30 Units Subcutaneous Daily  . insulin glargine  40 Units Subcutaneous Q1400  . irbesartan  75 mg Oral Daily  . levothyroxine  125 mcg Oral QAC breakfast  . metFORMIN  1,000 mg Oral Q supper  . metFORMIN  500 mg Oral Q breakfast  . potassium chloride  40 mEq Oral BID  . sodium chloride flush  3 mL Intravenous Q12H  . spironolactone  25 mg Oral QODAY     Infusions: . sodium chloride         Patient Profile   Danielle [name] is a 71 y.o. female with h/o HTN, CAD s/p stent x 2 in 2010 with one replaced in 2012, Diabetes, Hypothyroidism, Chronic pain, and OSA.   Admitted with hypoxia requiring CPAP and worsening SOB.  Assessment/Plan   1. Acute hypoxic respiratory failure - Requiring CPAP on admission. Now improved to O2 via [place]. 2. Acute on chronic systolic CHF due to ICM - Echo today reviewed by Dr. Haroldine Laws. EF ~40% with at least mild MS. + LVH. Restrictive filling pattern. - NYHA class III symptoms at baseline - Volume status remains at overloaded on exam with elevated JVP and 1+ ankle edema.  - Continue IV lasix 60 mg BID with good response so far.  - Continue  spironolactone 25 mg daily - Continue irbesartan 75 mg daily - Reinforced fluid restriction to < 2 L daily, sodium restriction to less than 2000 mg daily, and the importance of daily weights.   3. CAD - H/o of stenting in 2010 and 2012. Pt states she went into "cardiac arrest" in 2016 during an attempt at knee surgery. She was taken emergently for cath, Was shocked > 3 times, and had "3" stents placed per her daughter. She states she also had a repeat stent in 2017.  - Denies CP. Continue ASA and statin.  - Would like to get records from [facility] in [place], [place] daughter says Dr.  [place] office has a copy. Per note 01/15/17 records have been received and are in "Chart Prep".  3. HTN - Systolic pressures ranging from 110-140s here, but low at home per daughter.  - Will adjust meds in the setting of treating her HF.  4. DM2 - Per primary 5. Hypothyroidism -  Per primary. 6. PPM - Pt states indication was "slow heart rate" she is not sure of the manufacturer. She is scheduled to establish with Dr. Macky Lower office.  7. OSA - Encouraged nightly CPAP.   Length of Stay: 1  Annamaria Helling  02/04/2017, 1:19 PM  Advanced Heart Failure Team Pager [phone] (M-F; 7a - 4p)  Please contact [facility] for night-coverage after hours (4p -7a ) and weekends on [email or web address]  Patient seen and examined with the above-signed Advanced Practice Provider and/or Housestaff. I personally reviewed laboratory data, imaging studies and relevant notes. I independently examined the patient and formulated the important aspects of the plan. I have edited the note to reflect any of my changes or salient points. I have personally discussed the plan with the patient and/or family.  Echo reviewed personally. EF 40-45% with restrictive filling pattern and mild mitral stenosis. On exam remains volume overloaded. Will continue IV lasix. Reinforced need for daily weights and reviewed use of sliding scale  diuretics. We will have HF Nurse Navigator see her as well.   CAD currently stable.   Stressed need to be complaint with CPAP.   We will follow.   Glori Bickers, MD  3:07 PM

## 2017-02-05 DIAGNOSIS — I5041 Acute combined systolic (congestive) and diastolic (congestive) heart failure: Secondary | ICD-10-CM

## 2017-02-05 HISTORY — DX: Acute combined systolic (congestive) and diastolic (congestive) heart failure: I50.41

## 2017-02-05 LAB — GLUCOSE, CAPILLARY
GLUCOSE-CAPILLARY: 144 mg/dL — AB (ref 65–99)
Glucose-Capillary: 93 mg/dL (ref 65–99)
Glucose-Capillary: 140 mg/dL — ABNORMAL HIGH (ref 65–99)
Glucose-Capillary: 162 mg/dL — ABNORMAL HIGH (ref 65–99)

## 2017-02-05 LAB — BASIC METABOLIC PANEL
Anion gap: 9 (ref 5–15)
BUN: 21 mg/dL — AB (ref 6–20)
CHLORIDE: 103 mmol/L (ref 101–111)
CO2: 26 mmol/L (ref 22–32)
CREATININE: 1.03 mg/dL — AB (ref 0.44–1.00)
Calcium: 9.2 mg/dL (ref 8.9–10.3)
GFR calc Af Amer: >60 mL/min (ref >60)
GFR calc non Af Amer: 54 mL/min — ABNORMAL LOW (ref >60)
Glucose, Bld: 93 mg/dL (ref 65–99)
Potassium: 4.3 mmol/L (ref 3.5–5.1)
Sodium: 138 mmol/L (ref 135–145)

## 2017-02-05 LAB — CBC
HEMATOCRIT: 28.6 % — AB (ref 36.0–46.0)
Hemoglobin: 9.1 g/dL — ABNORMAL LOW (ref 12.0–15.0)
MCH: 21.2 pg — AB (ref 26.0–34.0)
MCHC: 31.8 g/dL (ref 30.0–36.0)
MCV: 66.7 fL — AB (ref 78.0–100.0)
PLATELETS: 190 10*3/uL (ref 150–400)
RBC: 4.29 MIL/uL (ref 3.87–5.11)
RDW: 16.5 % — AB (ref 11.5–15.5)
WBC: 9.2 10*3/uL (ref 4.0–10.5)

## 2017-02-05 MED ORDER — ZOLPIDEM TARTRATE 5 MG PO TABS: 5.0000 mg | ORAL_TABLET | Freq: Once | ORAL | Status: DC

## 2017-02-05 MED ORDER — FUROSEMIDE 10 MG/ML IJ SOLN: 80.0000 mg | Freq: Two times a day (BID) | INTRAMUSCULAR | Status: DC

## 2017-02-05 MED ORDER — FUROSEMIDE 10 MG/ML IJ SOLN
80.0000 mg | Freq: Two times a day (BID) | INTRAMUSCULAR | Status: DC
  Administered 2017-02-05 – 2017-02-06 (×3): 80 mg via INTRAVENOUS
  Filled 2017-02-05 (×3): qty 8

## 2017-02-05 NOTE — Progress Notes (Addendum)
Heart Failure Navigator Consult Note  Presentation: per Dr Haroldine Laws Danielle [name] is a 71 y.o. female with h/o Systolic CHF, HTN, CAD, Diabetes, Hypothyroidism, Chronic pain, and OSA.   Pt presented to [facility] 02/03/17 with SOB that woke her up from sleep. Reports non-compliance with her CPAP. Reported lightheadedness, diaphoresis, and SOB, similar to when her sugar is low. BG normal on EMS arrival but 02 sats in 80% on RA. Pt placed on CPAP and brought to ED. Weaned to [place]. CXR shows mild vascular congestion/pulmonary edema.  BNP elevated at 199. Other pertinent labs on admission include Cr 1.2, K 3.8, Hgb 9.8, WBC 9.0  She recently moved from [place] to [place] to be with family.   She states she has been gradually feeling worse for about 2 weeks. Denies fevers or chills, but has been waking up in the night SOB and sweating. She has been non-compliance with her CPAP for an undisclosed amount of time. She denies chest pain, and states she has never had chest pain despite multiple stents.  Does not weight daily. She sleeps on 3 pillows or a wedge. She denies lightheadedness or dizziness. She has a pacemaker for "slow heart rate" and was set to establish with Dr. Curt Bears in the coming weeks. She feels better since admit with IV lasix. She feels like her UO has tapered off at home on torsemide. She has been living with her daughter here in [place] for ~ 1 month. She has a distant history of smoking (Stopped when she was in her late 57s) 10-15 pack year history.   She is negative 2.5 L total on IV lasix. No weight recorded this am. Creatinine stable.  Past Medical History:  Diagnosis Date  . Arthritis   . Blood dyscrasia    per pt-has small blood cells-appears as if anemic  . Cancer (HCC)    breast  . CHF (congestive heart failure) ([place])   . Complication of anesthesia    difficult to awaken from per pt  . Diabetes mellitus without complication ([place])   . Dyspnea   . Essential hypertension   . Heart disease    . Hypothyroidism   . Presence of permanent cardiac pacemaker   . Sleep apnea   . Thyroid disease    hypothyroid    Social History   Social History  . Marital status: Married    Spouse name: N/A  . Number of children: N/A  . Years of education: N/A   Social History Main Topics  . Smoking status: Former Smoker    Packs/day: 1.00    Years: 22.00    Types: Cigarettes    Quit date: 86  . Smokeless tobacco: Never Used  . Alcohol use Yes     Comment: ocassional  . Drug use: No  . Sexual activity: No   Other Topics Concern  . None   Social History Narrative  . None    ECHO:Study Conclusions-02/04/17  - Left ventricle: The cavity size was normal. Wall thickness was   increased in a pattern of mild LVH. Systolic function was mildly   reduced. The estimated ejection fraction was in the range of 45%   to 50%. Features are consistent with a pseudonormal left   ventricular filling pattern, with concomitant abnormal relaxation   and increased filling pressure (grade 2 diastolic dysfunction).   Doppler parameters are consistent with high ventricular filling   pressure. - Aortic valve: There was trivial regurgitation. - Mitral valve: Calcified annulus. Mildly thickened leaflets .  There was mild regurgitation. - Left atrium: The atrium was severely dilated. - Pulmonary arteries: PA peak pressure: 44 mm Hg (S).  BNP    Component Value Date/Time   BNP 199.2 (H) 02/03/2017 4680    ProBNP No results found for: PROBNP   Education Assessment and Provision:  Detailed education and instructions provided on heart failure disease management including the following:  Signs and symptoms of Heart Failure When to call the physician Importance of daily weights Low sodium diet Fluid restriction Medication management Anticipated future follow-up appointments  Patient education given on each of the above topics.  Patient acknowledges understanding and acceptance of all  instructions.  I spoke with patient regarding her HF and current hospitalization.  She tells me that she recently moved here from [place].  She also says that she ran out of her medications secondary to the fact that she had no new physician locally and therefore no prescriptions.  She has a scale and we discussed the importance of daily weights and when to contact the physician.   She says that she eats "well".  We  discussed a low sodium diet and high sodium foods to avoid.  Through dietary recall she admits that she eats hotdog and drinks V8 juice.  I strongly discouraged these types of high sodium foods.  She says that once she is established and has prescriptions she will have no issues getting or taking her prescribed medications.  She will follow in the AHF Clinic after discharge.  Education Materials:  "Living Better With Heart Failure" Booklet, Daily Weight Tracker Tool    High Risk Criteria for Readmission and/or Poor Patient Outcomes:  (Recommend Follow-up with Advanced Heart Failure Clinic)--yes   EF <30%- 45-50% with grade 2 dias dys  2 or more admissions in 6 months-No -new to area  Difficult social situation-No  Demonstrates medication noncompliance-Yes-admits that she recently ran out of meds because of moving and no prescription.   Barriers of Care:  Knowledge and compliance  Discharge Planning:   Plans to return to home alone.  She will benefit from HF [employer] referral for ongoing education and compliance reinforcement.  She is agreeable and wishes to be a part of the program.

## 2017-02-05 NOTE — Progress Notes (Signed)
Advanced Heart Failure Rounding Note  PCP: Dr. Delfina Redwood Primary Cardiologist: New to [place] (Dr. Haroldine Laws)   Subjective:    Feeling better this am. [facility] halls last night without O2, and apparently did well, but no recorded pulse ox.    Of note, she has >40oz of fluid on her tray. Discussed fluid restriction and she became defensive. She states some of them are from last night, but that she sometimes needs a whole glass of water to swallow 1 pill.   Negative 1.4 L. No weight this am.   Echo 02/04/17 LVEF 40-45% with restrictive filling pattern and mild mitral stenosis.   Objective:   Weight Range: 215 lb (97.5 kg) Body mass index is 36.9 kg/m.   Vital Signs:   Temp:  [98 F (36.7 C)-98.4 F (36.9 C)] 98 F (36.7 C) (09/11 0427) Pulse Rate:  [72-77] 72 (09/11 0427) Resp:  [18-19] 18 (09/11 0427) BP: (119-144)/(63-77) 135/65 (09/11 0427) SpO2:  [99 %-100 %] 100 % (09/11 0427) Weight:  [215 lb (97.5 kg)] 215 lb (97.5 kg) (09/10 2140) Last BM Date: 02/04/17  Weight change: Filed Weights   02/03/17 1208 02/03/17 2058 02/04/17 2140  Weight: 215 lb 11.2 oz (97.8 kg) 215 lb 6.2 oz (97.7 kg) 215 lb (97.5 kg)    Intake/Output:   Intake/Output Summary (Last 24 hours) at 02/05/17 0817 Last data filed at 02/05/17 0600  Gross per 24 hour  Intake              960 ml  Output             2450 ml  Net            -1490 ml      Physical Exam    General: Elderly appearing. No resp difficulty. HEENT: Normal Neck: Supple. JVP difficult 2/2 body habitus. Carotids 2+ bilat; no bruits. No thyromegaly or nodule noted. Cor: PMI nondisplaced. RRR, Mild TR murmur Lungs: CTAB, normal effort. Abdomen: Obese, soft, non-tender, non-distended, no HSM. No bruits or masses. +BS  Extremities: No cyanosis, clubbing, or rash. 1+ peripheral edema at least.  Neuro: Alert & orientedx3, cranial nerves grossly intact. moves all 4 extremities w/o difficulty. Affect pleasant   Telemetry   NSR 80s,  Personally reviewed  EKG    NSR at admit.  Labs    CBC  Recent Labs  02/03/17 0630 02/05/17 0506  WBC 9.0 PENDING  NEUTROABS 5.3  --   HGB 9.8* 9.1*  HCT 30.7* 28.6*  MCV 66.3* 66.7*  PLT 269 PENDING   Basic Metabolic Panel  Recent Labs  02/04/17 0122 02/05/17 0506  NA 139 138  K 4.0 4.3  CL 102 103  CO2 28 26  GLUCOSE 92 93  BUN 22* 21*  CREATININE 1.18* 1.03*  CALCIUM 8.9 9.2   Liver Function Tests  Recent Labs  02/03/17 0630  AST 34  ALT 35  ALKPHOS 55  BILITOT 0.9  PROT 7.0  ALBUMIN 4.0   No results for input(s): LIPASE, AMYLASE in the last 72 hours. Cardiac Enzymes  Recent Labs  02/03/17 1438 02/03/17 1908 02/04/17 0122  TROPONINI 0.09* 0.09* 0.07*    BNP: BNP (last 3 results)  Recent Labs  02/03/17 0632  BNP 199.2*    ProBNP (last 3 results) No results for input(s): PROBNP in the last 8760 hours.   D-Dimer No results for input(s): DDIMER in the last 72 hours. Hemoglobin A1C No results for input(s): HGBA1C in the last  72 hours. Fasting Lipid Panel No results for input(s): CHOL, HDL, LDLCALC, TRIG, CHOLHDL, LDLDIRECT in the last 72 hours. Thyroid Function Tests No results for input(s): TSH, T4TOTAL, T3FREE, THYROIDAB in the last 72 hours.  Invalid input(s): FREET3  Other results:   Imaging     No results found.   Medications:     Scheduled Medications: . aspirin EC  81 mg Oral Daily  . atorvastatin  20 mg Oral QHS  . calcium citrate  1 tablet Oral BID  . carvedilol  6.25 mg Oral BID WC  . colchicine  0.6 mg Oral BID  . enoxaparin (LOVENOX) injection  40 mg Subcutaneous Q24H  . ferrous sulfate  325 mg Oral BID WC  . furosemide  60 mg Intravenous BID  . insulin aspart  0-5 Units Subcutaneous QHS  . insulin aspart  0-9 Units Subcutaneous TID WC  . insulin aspart  5 Units Subcutaneous Q lunch  . insulin glargine  30 Units Subcutaneous Daily  . insulin glargine  40 Units Subcutaneous Q1400  . irbesartan  75  mg Oral Daily  . latanoprost  1 drop Both Eyes QHS  . levothyroxine  125 mcg Oral QAC breakfast  . pantoprazole  40 mg Oral Daily  . potassium chloride  40 mEq Oral BID  . sodium chloride flush  3 mL Intravenous Q12H  . spironolactone  25 mg Oral QODAY  . ticagrelor  90 mg Oral BID     Infusions: . sodium chloride       PRN Medications:  sodium chloride, acetaminophen, ondansetron (ZOFRAN) IV, sodium chloride flush    Patient Profile   Danielle [name] is a 71 y.o. female with h/o HTN, CAD s/p stent x 2 in 2010 with one replaced in 2012, Diabetes, Hypothyroidism, Chronic pain, and OSA.   Admitted with hypoxia requiring CPAP and worsening SOB.  Assessment/Plan   1. Acute hypoxic respiratory failure - Requiring CPAP on admission. - Now improved and stable on room air. RN to ambulate with pulse ox and record.  2. Acute on chronic systolic CHF due to ICM - Echo 02/04/17 LVEF 40-45% with restrictive filling pattern and mild mitral stenosis.  - NYHA class III symptoms. - Volume status remains overloaded. Increase lasix to 80 mg BID for today.  - Continue lasix 60 mg BID today.  - Continue spironolactone 25 mg daily - Continue irbesartan 75 mg daily - Reinforced fluid restriction to < 2 L daily, sodium restriction to less than 2000 mg daily, and the importance of daily weights.   3. CAD - H/o of stenting in 2010 and 2012. Pt states she went into "cardiac arrest" in 2016 during an attempt at knee surgery. She was taken emergently for cath, Was shocked > 3 times, and had "3" stents placed per her daughter. She states she also had a repeat stent in 2017.  - No s/s ACS. Continue ASA and statin.  - Would like to get records from [facility] in [place], [place] daughter says Dr. Curt Bears office has a copy. Per note 01/15/17 records have been received and are in "Chart Prep".  3. HTN - Will adjust meds in the setting of treating her HF.  4. DM2 - Per primary.  5. Hypothyroidism -  Per primary. 6. PPM - Pt states indication was "slow heart rate" she is not sure of the manufacturer. She is scheduled to establish with Dr. Macky Lower office.  - Stable.  7. OSA - Encouraged nightly CPAP. No change.  Will have HF nurse navigator see to discuss Salt/Fluid restriction. She may be a good paramedicine candidate.   Length of Stay: 2  Annamaria Helling  02/05/2017, 8:17 AM  Advanced Heart Failure Team Pager [phone] (M-F; 7a - 4p)  Please contact [facility] for night-coverage after hours (4p -7a ) and weekends on [email or web address]   Patient seen and examined with the above-signed Advanced Practice Provider and/or Housestaff. I personally reviewed laboratory data, imaging studies and relevant notes. I independently examined the patient and formulated the important aspects of the plan. I have edited the note to reflect any of my changes or salient points. I have personally discussed the plan with the patient and/or family.  She remains volume overloaded but is improving. EF 40-45% on echo. Likely one more day of diuresis and then home tomorrow on increased dose of torsemide.   Long talk about HF education. Our HF nurse navigator has seen.   Glori Bickers, MD  4:32 PM

## 2017-02-05 NOTE — Progress Notes (Signed)
PROGRESS NOTE    Danielle [name]   WOE:[id number]  DOB: February 14, 1946  DOA: 02/03/2017 PCP: Seward Carol, MD   Brief Narrative:  Danielle [name] 71 year old female, recently moved to [place], [place] from [place] in May 2018, yet to establish care with PCP/Cardiology (had upcoming appointment with Dr. 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 EMS found her with oxygen saturation of 80% on room air, diaphoresis, placed on CPAP. Admitted for acute hypoxic respiratory failure secondary to acute on chronic systolic CHF.   Subjective: No complaints of dyspnea or cough. ROS: no complaints of nausea, vomiting, constipation diarrhea,or dysuria. No other complaints.   Assessment & Plan:   Principal Problem:   Acute respiratory failure with hypoxia / Acute on chronic systolic and diastolic CHF due to an ischemic cardiomyopathy ECHO- LVEF 45-50 percent and grade 2 diastolic dysfunction - cardiology would like to continue lasix 60 mg IV BID, Aldactone - will have education about diet/ sodium etc  Active Problems: Lactic acidosis - likely from poor cardiac output  AOCD - reviewed anemia panel    Essential hypertension - cont current meds    Diabetes mellitus without complication  - Metformin on hold- cont Lantus and SSI  Hypothyroid - Synthroid  Pacemaker  OSA - not compliant with CPAP- needs a new sleep study  CAD s/p stents - Statin, ASA, Brillinta, Coreg  DVT prophylaxis: Lovenox Code Status: Full code Family Communication:  Disposition Plan: home when stable Consultants:   cardiology Procedures:   2 D ECHO Left ventricle: The cavity size was  normal. Wall thickness was   increased in a pattern of mild LVH. Systolic function was mildly   reduced. The estimated ejection fraction was in the range of 45%   to 50%. Features are consistent with a pseudonormal left   ventricular filling pattern, with concomitant abnormal relaxation   and increased filling pressure (grade 2 diastolic dysfunction).   Doppler parameters are consistent with high ventricular filling   pressure. - Aortic valve: There was trivial regurgitation. - Mitral valve: Calcified annulus. Mildly thickened leaflets .   There was mild regurgitation. - Left atrium: The atrium was severely dilated. - Pulmonary arteries: PA peak pressure: 44 mm Hg (S).  Antimicrobials:  Anti-infectives    None       Objective: Vitals:   02/04/17 2140 02/05/17 0427 02/05/17 0843 02/05/17 0852  BP: 126/63 135/65 (!) 154/52   Pulse: 72 72 82 96  Resp: 18 18 18 18   Temp: 98.2 F (36.8 C) 98 F (36.7 C) 97.9 F (36.6 C)   TempSrc: Oral Oral Oral   SpO2: 100% 100% 99% 100%  Weight: 97.5 kg (215 lb)   92.5 kg (203 lb 14.4 oz)  Height: 5\' 4"  (1.626 m)       Intake/Output Summary (Last 24 hours) at 02/05/17 1147 Last data filed at 02/05/17 1126  Gross per 24 hour  Intake             1083 ml  Output             2850 ml  Net            -1767 ml  Filed Weights   02/03/17 2058 02/04/17 2140 02/05/17 0852  Weight: 97.7 kg (215 lb 6.2 oz) 97.5 kg (215 lb) 92.5 kg (203 lb 14.4 oz)    Examination: General exam: Appears comfortable  HEENT: PERRLA, oral mucosa moist, no sclera icterus or thrush Respiratory system: Clear to auscultation. Respiratory effort normal. Cardiovascular system: S1 & S2 heard, RRR.  No murmurs  Gastrointestinal system: Abdomen soft, non-tender, nondistended. Normal bowel sound. No organomegaly Central nervous system: Alert and oriented. No focal neurological deficits. Extremities: No cyanosis, clubbing or edema Skin: No rashes or ulcers Psychiatry:   Mood & affect appropriate.     Data Reviewed: I have personally reviewed following labs and imaging studies  CBC:  Recent Labs Lab 02/03/17 0630 02/05/17 0506  WBC 9.0 9.2  NEUTROABS 5.3  --   HGB 9.8* 9.1*  HCT 30.7* 28.6*  MCV 66.3* 66.7*  PLT 269 676   Basic Metabolic Panel:  Recent Labs Lab 02/03/17 0630 02/04/17 0122 02/05/17 0506  NA 141 139 138  K 3.8 4.0 4.3  CL 105 102 103  CO2 26 28 26   GLUCOSE 129* 92 93  BUN 24* 22* 21*  CREATININE 1.20* 1.18* 1.03*  CALCIUM 8.9 8.9 9.2   GFR: Estimated Creatinine Clearance: 56 mL/min (A) (by C-G formula based on SCr of 1.03 mg/dL (H)). Liver Function Tests:  Recent Labs Lab 02/03/17 0630  AST 34  ALT 35  ALKPHOS 55  BILITOT 0.9  PROT 7.0  ALBUMIN 4.0   No results for input(s): LIPASE, AMYLASE in the last 168 hours. No results for input(s): AMMONIA in the last 168 hours. Coagulation Profile: No results for input(s): INR, PROTIME in the last 168 hours. Cardiac Enzymes:  Recent Labs Lab 02/03/17 1438 02/03/17 1908 02/04/17 0122  TROPONINI 0.09* 0.09* 0.07*   BNP (last 3 results) No results for input(s): PROBNP in the last 8760 hours. HbA1C: No results for input(s): HGBA1C in the last 72 hours. CBG:  Recent Labs Lab 02/04/17 0817 02/04/17 1155 02/04/17 1705 02/04/17 2150 02/05/17 0807  GLUCAP 86 146* 114* 140* 93   Lipid Profile: No results for input(s): CHOL, HDL, LDLCALC, TRIG, CHOLHDL, LDLDIRECT in the last 72 hours. Thyroid Function Tests: No results for input(s): TSH, T4TOTAL, FREET4, T3FREE, THYROIDAB in the last 72 hours. Anemia Panel:  Recent Labs  02/04/17 1537  VITAMINB12 1,060*  FOLATE 34.0  FERRITIN 432*  TIBC 309  IRON 50  RETICCTPCT 1.5   Urine analysis: No results found for: COLORURINE, APPEARANCEUR, LABSPEC, PHURINE, GLUCOSEU, HGBUR, BILIRUBINUR, KETONESUR, PROTEINUR, UROBILINOGEN, NITRITE, LEUKOCYTESUR Sepsis Labs: @LABRCNTIP (procalcitonin:4,lacticidven:4) )No  results found for this or any previous visit (from the past 240 hour(s)).       Radiology Studies: No results found.    Scheduled Meds: . aspirin EC  81 mg Oral Daily  . atorvastatin  20 mg Oral QHS  . calcium citrate  1 tablet Oral BID  . carvedilol  6.25 mg Oral BID WC  . colchicine  0.6 mg Oral BID  . enoxaparin (LOVENOX) injection  40 mg Subcutaneous Q24H  . ferrous sulfate  325 mg Oral BID WC  . furosemide  80 mg Intravenous BID  . insulin aspart  0-5 Units Subcutaneous QHS  . insulin aspart  0-9 Units Subcutaneous TID WC  . insulin aspart  5 Units Subcutaneous Q lunch  . insulin glargine  30 Units Subcutaneous Daily  . insulin glargine  40 Units Subcutaneous Q1400  . irbesartan  75 mg Oral Daily  .  latanoprost  1 drop Both Eyes QHS  . levothyroxine  125 mcg Oral QAC breakfast  . pantoprazole  40 mg Oral Daily  . potassium chloride  40 mEq Oral BID  . sodium chloride flush  3 mL Intravenous Q12H  . spironolactone  25 mg Oral QODAY  . ticagrelor  90 mg Oral BID   Continuous Infusions: . sodium chloride       LOS: 2 days    Time spent in minutes: 35    Debbe Odea, MD [facility] Pager: [email or web address] Password [facility] 02/05/2017, 11:47 AM

## 2017-02-06 DIAGNOSIS — I10 Essential (primary) hypertension: Secondary | ICD-10-CM

## 2017-02-06 DIAGNOSIS — E079 Disorder of thyroid, unspecified: Secondary | ICD-10-CM

## 2017-02-06 DIAGNOSIS — I5021 Acute systolic (congestive) heart failure: Secondary | ICD-10-CM

## 2017-02-06 DIAGNOSIS — E119 Type 2 diabetes mellitus without complications: Secondary | ICD-10-CM

## 2017-02-06 DIAGNOSIS — R0989 Other specified symptoms and signs involving the circulatory and respiratory systems: Secondary | ICD-10-CM

## 2017-02-06 DIAGNOSIS — C801 Malignant (primary) neoplasm, unspecified: Secondary | ICD-10-CM

## 2017-02-06 DIAGNOSIS — E872 Acidosis: Secondary | ICD-10-CM

## 2017-02-06 DIAGNOSIS — J9601 Acute respiratory failure with hypoxia: Secondary | ICD-10-CM

## 2017-02-06 DIAGNOSIS — R0602 Shortness of breath: Secondary | ICD-10-CM

## 2017-02-06 DIAGNOSIS — N179 Acute kidney failure, unspecified: Secondary | ICD-10-CM

## 2017-02-06 LAB — BASIC METABOLIC PANEL
Anion gap: 7 (ref 5–15)
BUN: 20 mg/dL (ref 6–20)
CHLORIDE: 103 mmol/L (ref 101–111)
CO2: 30 mmol/L (ref 22–32)
Calcium: 9.6 mg/dL (ref 8.9–10.3)
Creatinine, Ser: 1.09 mg/dL — ABNORMAL HIGH (ref 0.44–1.00)
GFR calc Af Amer: 58 mL/min — ABNORMAL LOW (ref >60)
GFR calc non Af Amer: 50 mL/min — ABNORMAL LOW (ref >60)
Glucose, Bld: 123 mg/dL — ABNORMAL HIGH (ref 65–99)
Potassium: 4.4 mmol/L (ref 3.5–5.1)
Sodium: 140 mmol/L (ref 135–145)

## 2017-02-06 LAB — GLUCOSE, CAPILLARY
GLUCOSE-CAPILLARY: 144 mg/dL — AB (ref 65–99)
Glucose-Capillary: 131 mg/dL — ABNORMAL HIGH (ref 65–99)

## 2017-02-06 MED ORDER — ASPIRIN 81 MG PO TABS: 81.0000 mg | ORAL_TABLET | Freq: Every day | ORAL | 3 refills | Status: DC

## 2017-02-06 MED ORDER — LOSARTAN POTASSIUM 25 MG PO TABS
25.0000 mg | ORAL_TABLET | Freq: Every day | ORAL | Status: DC
  Administered 2017-02-06: 25 mg via ORAL
  Filled 2017-02-06: qty 1

## 2017-02-06 MED ORDER — TICAGRELOR 90 MG PO TABS: 90.0000 mg | ORAL_TABLET | Freq: Two times a day (BID) | ORAL | 3 refills | Status: DC

## 2017-02-06 MED ORDER — CARVEDILOL 6.25 MG PO TABS: 6.2500 mg | ORAL_TABLET | Freq: Two times a day (BID) | ORAL | 6 refills | Status: DC

## 2017-02-06 MED ORDER — LOSARTAN POTASSIUM 25 MG PO TABS: 25.0000 mg | ORAL_TABLET | Freq: Every day | ORAL | 6 refills | Status: DC

## 2017-02-06 MED ORDER — POTASSIUM CHLORIDE CRYS ER 10 MEQ PO TBCR: 40.0000 meq | EXTENDED_RELEASE_TABLET | Freq: Every day | ORAL | 3 refills | Status: DC

## 2017-02-06 MED ORDER — SPIRONOLACTONE 25 MG PO TABS: 25.0000 mg | ORAL_TABLET | ORAL | 3 refills | Status: DC

## 2017-02-06 MED ORDER — TORSEMIDE 20 MG PO TABS: 40.0000 mg | ORAL_TABLET | Freq: Two times a day (BID) | ORAL | Status: DC

## 2017-02-06 MED ORDER — POTASSIUM CHLORIDE CRYS ER 20 MEQ PO TBCR
40.0000 meq | EXTENDED_RELEASE_TABLET | Freq: Every day | ORAL | Status: DC
  Administered 2017-02-06: 40 meq via ORAL
  Filled 2017-02-06: qty 2

## 2017-02-06 MED ORDER — TORSEMIDE 20 MG PO TABS: 40.0000 mg | ORAL_TABLET | Freq: Two times a day (BID) | ORAL | 3 refills | Status: DC

## 2017-02-06 MED ORDER — ATORVASTATIN CALCIUM 80 MG PO TABS: 80.0000 mg | ORAL_TABLET | Freq: Every day | ORAL | 3 refills | Status: DC

## 2017-02-06 NOTE — Progress Notes (Signed)
Discharge instructions (including medications) discussed with and copy provided to patient/caregiver 

## 2017-02-06 NOTE — Discharge Summary (Signed)
Physician Discharge Summary  Danielle [name] UMP:[id number] DOB: [birth year] DOA: 02/03/2017  PCP: Seward Carol, MD  Admit date: 02/03/2017 Discharge date: 02/06/2017  Admitted From: Home Disposition: Home  Recommendations for Outpatient Follow-up:  1. Follow up with PCP in 1-2 weeks 2. Follow up with Cardiology Dr. Curt Bears as an outpatient 3. Follow up with Heart Failure Clinic as an outpatient 4. Have Outpatient Sleep Study Re-evaluated  5. Please obtain CMP/CBC, Mag, Phos in one week 6. Please follow up on the following pending results:  Home Health: No  Equipment/Devices: None    Discharge Condition: Stable  CODE STATUS: FULL CODE Diet recommendation: Heart Healthy Carb Modified Diet with 1800 mL Fluid Restriction  Brief/Interim Summary: Danielle [name] 71 year old female, recently moved to [place], [place] from [place] in May 2018, yet to establish care with PCP/Cardiology (had upcoming appointment with Dr. 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 EMS found her with oxygen saturation of 80% on room air, diaphoresis, placed on CPAP. Admitted for acute hypoxic respiratory failure secondary to acute on chronic systolic CHF. Was diuresed and improved. Seen by Cardiology and they adjusted her medications. She was deemed medically stable to D/C home and will need to establish with PCP and Cardiology as an outpatient.   Discharge Diagnoses:  Principal Problem:   Acute combined systolic and diastolic congestive heart failure (HCC) Active Problems:   Essential hypertension   Diabetes mellitus without complication (HCC)   Thyroid disease   Acute kidney injury  ([place])   Lactic acidosis   Acute respiratory failure with hypoxia (HCC)  Acute respiratory failure with hypoxia from Acute on chronic systolic and diastolic CHF due to an ischemic cardiomyopathy -Respiratory Failure Improved and did not require Home O2 with Walk Screen -BNP on Admit was 199 -ECHO- LVEF 45-50 percent and grade 2 diastolic dysfunction - Cardiology recommended Home Torsemide 40 mg po BID, along with 40 KCl and Spironolactone; C/w Coreg and Losartan - Will have education about diet/ sodium etc - Patient is -7.327 Liters and down 12 lbs since admission  - Outpatient Follow up with Cardiology and Heart Failure Clinic   Lactic acidosis - likely from poor cardiac output and now improved  AOCD - Reviewed anemia panel - Hb/Hct stable at 9.1/28.6 - Continue to Monitor for S/Sx of Bleeding   Essential Hypertension - Cont current meds with Cardiology adjustments     Diabetes mellitus without complication  - Metformin was on hold - Cont Lantus and SSI and resume Home Metformin   Hypothyroid - C/w Home Synthroid  Pacemaker - Follow up with Cardiology Dr. Curt Bears as an outpatient   OSA - Not compliant with CPAP - Needs a new sleep study as an outpatient  CAD s/p stents - Cardiology evaluated and appreciated Recc's - C/w Statin (80 mg of Atorvastatin), ASA, Brillinta, Coreg, and Losartan  Discharge Instructions  Discharge Instructions    (HEART FAILURE PATIENTS) Call MD:  Anytime you have any of the following symptoms: 1) 3 pound weight gain in 24 hours or 5 pounds in 1 week 2) shortness of breath, with or without a dry hacking cough 3) swelling in the hands, feet or stomach 4) if you have  to sleep on extra pillows at night in order to breathe.    Complete by:  As directed    AMB referral to CHF clinic    Complete by:  As directed    Call MD for:  difficulty breathing, headache or visual disturbances    Complete by:  As directed    Call MD for:  extreme  fatigue    Complete by:  As directed    Call MD for:  hives    Complete by:  As directed    Call MD for:  persistant dizziness or light-headedness    Complete by:  As directed    Call MD for:  persistant nausea and vomiting    Complete by:  As directed    Call MD for:  redness, tenderness, or signs of infection (pain, swelling, redness, odor or green/yellow discharge around incision site)    Complete by:  As directed    Call MD for:  severe uncontrolled pain    Complete by:  As directed    Call MD for:  temperature >100.4    Complete by:  As directed    Diet - low sodium heart healthy    Complete by:  As directed    Diet Carb Modified    Complete by:  As directed    Discharge instructions    Complete by:  As directed    Follow up with PCP, Cardiology, and Heart Failure Clinic as an outpatient. Take all medications as prescribed. If symptoms change or worsen please return to the ED for evaluation.   Increase activity slowly    Complete by:  As directed      Allergies as of 02/06/2017      Reactions   Sulfa Antibiotics Rash      Medication List    STOP taking these medications   GNP GINGKO BILOBA EXTRACT PO   HYDROcodone-acetaminophen 10-325 MG tablet Commonly known as:  NORCO     TAKE these medications   ACIDOPHILUS PO Take 1 capsule by mouth daily.   aspirin 81 MG tablet Take 1 tablet (81 mg total) by mouth daily.   atorvastatin 80 MG tablet Commonly known as:  LIPITOR Take 1 tablet (80 mg total) by mouth at bedtime.   Biotin 10 MG Caps Take 10 mg by mouth daily.   calcium citrate 950 MG tablet Commonly known as:  CALCITRATE - dosed in mg elemental calcium Take 1 tablet by mouth 2 (two) times daily.   carvedilol 6.25 MG tablet Commonly known as:  COREG Take 1 tablet (6.25 mg total) by mouth 2 (two) times daily with a meal.   CHONDROITIN SULFATE PO Take 250 mg by mouth daily.   colchicine 0.6 MG tablet Take 0.6 mg by mouth 2 (two) times daily.    ferrous sulfate 325 (65 FE) MG tablet Take 325 mg by mouth 2 (two) times daily with a meal.   folic acid 1 MG tablet Commonly known as:  FOLVITE Take 1 mg by mouth daily.   insulin glargine 100 UNIT/ML injection Commonly known as:  LANTUS Inject 45-50 Units into the skin See admin instructions. Inject 50 units SQ in the morning and inject 45 units SQ at bedtime   insulin lispro 100 UNIT/ML injection Commonly known as:  HUMALOG Inject 10 Units into the skin daily with lunch.   levothyroxine 137 MCG tablet Commonly known as:  SYNTHROID, LEVOTHROID Take 137 mcg by mouth daily before breakfast.   losartan 25 MG  tablet Commonly known as:  COZAAR Take 1 tablet (25 mg total) by mouth daily.   metFORMIN 500 MG tablet Commonly known as:  GLUCOPHAGE Take 500-1,000 mg by mouth See admin instructions. Take 500 mg by mouth in the morning and take 1000 mg by mouth in the evening   omeprazole 40 MG capsule Commonly known as:  PRILOSEC Take 40 mg by mouth daily.   potassium chloride 10 MEQ tablet Commonly known as:  K-DUR,KLOR-CON Take 4 tablets (40 mEq total) by mouth daily.   spironolactone 25 MG tablet Commonly known as:  ALDACTONE Take 1 tablet (25 mg total) by mouth every other day. What changed:  medication strength  how much to take   SUPER B COMPLEX PO Take 2 tablets by mouth daily.   ticagrelor 90 MG Tabs tablet Commonly known as:  BRILINTA Take 1 tablet (90 mg total) by mouth 2 (two) times daily.   torsemide 20 MG tablet Commonly known as:  DEMADEX Take 2 tablets (40 mg total) by mouth 2 (two) times daily. What changed:  when to take this   Travoprost (BAK Free) 0.004 % Soln ophthalmic solution Commonly known as:  TRAVATAN Place 1 drop into both eyes daily.   VITAMIN C PO Take 1 tablet by mouth daily.            Discharge Care Instructions        Start     Ordered   02/06/17 0000  carvedilol (COREG) 6.25 MG tablet  2 times daily with meals     Question:  Supervising Provider  Answer:  Jolaine Artist   02/06/17 0931   02/06/17 0000  aspirin 81 MG tablet  Daily    Question:  Supervising Provider  Answer:  Jolaine Artist   02/06/17 0931   02/06/17 0000  atorvastatin (LIPITOR) 80 MG tablet  Daily at bedtime    Question:  Supervising Provider  Answer:  Jolaine Artist   02/06/17 0931   02/06/17 0000  losartan (COZAAR) 25 MG tablet  Daily    Question:  Supervising Provider  Answer:  Jolaine Artist   02/06/17 0931   02/06/17 0000  potassium chloride SA (K-DUR,KLOR-CON) 10 MEQ tablet  Daily    Question:  Supervising Provider  Answer:  Jolaine Artist   02/06/17 0931   02/06/17 0000  spironolactone (ALDACTONE) 25 MG tablet  Every other day    Question:  Supervising Provider  Answer:  Glori Bickers R   02/06/17 0931   02/06/17 0000  torsemide (DEMADEX) 20 MG tablet  2 times daily    Question:  Supervising Provider  Answer:  Jolaine Artist   02/06/17 0931   02/06/17 0000  ticagrelor (BRILINTA) 90 MG TABS tablet  2 times daily    Question:  Supervising Provider  Answer:  Jolaine Artist   02/06/17 0931   02/06/17 0000  Increase activity slowly     02/06/17 1306   02/06/17 0000  Diet - low sodium heart healthy     02/06/17 1306   02/06/17 0000  Discharge instructions    Comments:  Follow up with PCP, Cardiology, and Heart Failure Clinic as an outpatient. Take all medications as prescribed. If symptoms change or worsen please return to the ED for evaluation.   02/06/17 1306   02/06/17 0000  Diet Carb Modified     02/06/17 1306   02/06/17 0000  Call MD for:  temperature >100.4     02/06/17  1306   02/06/17 0000  Call MD for:  persistant nausea and vomiting     02/06/17 1306   02/06/17 0000  Call MD for:  severe uncontrolled pain     02/06/17 1306   02/06/17 0000  Call MD for:  redness, tenderness, or signs of infection (pain, swelling, redness, odor or green/yellow discharge around incision site)      02/06/17 1306   02/06/17 0000  Call MD for:  difficulty breathing, headache or visual disturbances     02/06/17 1306   02/06/17 0000  Call MD for:  persistant dizziness or light-headedness     02/06/17 1306   02/06/17 0000  Call MD for:  hives     02/06/17 1306   02/06/17 0000  Call MD for:  extreme fatigue     02/06/17 1306   02/06/17 0000  (HEART FAILURE PATIENTS) Call MD:  Anytime you have any of the following symptoms: 1) 3 pound weight gain in 24 hours or 5 pounds in 1 week 2) shortness of breath, with or without a dry hacking cough 3) swelling in the hands, feet or stomach 4) if you have to sleep on extra pillows at night in order to breathe.     02/06/17 1306   02/06/17 0000  AMB referral to CHF clinic     02/06/17 1306     Follow-up Information    [facility] Follow up on 02/15/2017.   Specialty:  Cardiology Why:  at 1130 for hospital follow up. Please bring all of your medications to your visit. The Code for parking is 7002. Leisure centre manager thru [employer] off of [address]. Underground parking on your right. Can also park in the [facility] ED lot and enter thru blue awning. Contact information: [address] [id number] [place] [phone]         Allergies  Allergen Reactions  . Sulfa Antibiotics Rash   Consultations:  Cardiology Heart Failure Team Dr. Haroldine Laws  Procedures/Studies: Dg Chest Port 1 View  Result Date: 02/03/2017 CLINICAL DATA:  Sudden onset shortness of breath this morning. Crackles in the lung bases. Decreased oxygen saturation. EXAM: PORTABLE CHEST 1 VIEW COMPARISON:  None. FINDINGS: Cardiac pacemaker. Shallow inspiration. Cardiac enlargement with mild pulmonary vascular congestion. Infiltrates in the lung bases likely to represent edema although multifocal pneumonia could also have this appearance. No blunting of costophrenic angles. No pneumothorax. Calcification of the aorta.  Old right rib fractures. Degenerative changes in the spine. IMPRESSION: Cardiac enlargement with mild pulmonary vascular congestion and bilateral basilar edema. Electronically Signed   By: Lucienne Capers M.D.   On: 02/03/2017 06:54   ECHOCARDIOGRAM on 02/04/17 Study Conclusions  - Left ventricle: The cavity size was normal. Wall thickness was   increased in a pattern of mild LVH. Systolic function was mildly   reduced. The estimated ejection fraction was in the range of 45%   to 50%. Features are consistent with a pseudonormal left   ventricular filling pattern, with concomitant abnormal relaxation   and increased filling pressure (grade 2 diastolic dysfunction).   Doppler parameters are consistent with high ventricular filling   pressure. - Aortic valve: There was trivial regurgitation. - Mitral valve: Calcified annulus. Mildly thickened leaflets .   There was mild regurgitation. - Left atrium: The atrium was severely dilated. - Pulmonary arteries: PA peak pressure: 44 mm Hg (S).   Subjective: Seen and examined and was breathing better. No Chest Pressure or complaints.  Wanting to go home. No other concerns or complaints at this time.   Discharge Exam: Vitals:   02/06/17 0910 02/06/17 1250  BP: 107/78   Pulse: 89   Resp: 20   Temp: 98.2 F (36.8 C)   SpO2: 100% 97%   Vitals:   02/06/17 0537 02/06/17 0910 02/06/17 0923 02/06/17 1250  BP: 129/64 107/78    Pulse: 80 89    Resp: 20 20    Temp: 98.4 F (36.9 C) 98.2 F (36.8 C)    TempSrc: Oral Oral    SpO2: 100% 100%  97%  Weight:   94.6 kg (208 lb 9.6 oz)   Height:       General: Pt is alert, awake, not in acute distress Cardiovascular: RRR, S1/S2 +, no rubs, no gallops Respiratory: CTA bilaterally, no wheezing, no rhonchi Abdominal: Soft, NT, ND, bowel sounds + Extremities: Trace edema, no cyanosis  The results of significant diagnostics from this hospitalization (including imaging, microbiology, ancillary and  laboratory) are listed below for reference.    Microbiology: No results found for this or any previous visit (from the past 240 hour(s)).   Labs: BNP (last 3 results)  Recent Labs  02/03/17 0632  BNP 916.3*   Basic Metabolic Panel:  Recent Labs Lab 02/03/17 0630 02/04/17 0122 02/05/17 0506 02/06/17 0511  NA 141 139 138 140  K 3.8 4.0 4.3 4.4  CL 105 102 103 103  CO2 26 28 26 30   GLUCOSE 129* 92 93 123*  BUN 24* 22* 21* 20  CREATININE 1.20* 1.18* 1.03* 1.09*  CALCIUM 8.9 8.9 9.2 9.6   Liver Function Tests:  Recent Labs Lab 02/03/17 0630  AST 34  ALT 35  ALKPHOS 55  BILITOT 0.9  PROT 7.0  ALBUMIN 4.0   No results for input(s): LIPASE, AMYLASE in the last 168 hours. No results for input(s): AMMONIA in the last 168 hours. CBC:  Recent Labs Lab 02/03/17 0630 02/05/17 0506  WBC 9.0 9.2  NEUTROABS 5.3  --   HGB 9.8* 9.1*  HCT 30.7* 28.6*  MCV 66.3* 66.7*  PLT 269 190   Cardiac Enzymes:  Recent Labs Lab 02/03/17 1438 02/03/17 1908 02/04/17 0122  TROPONINI 0.09* 0.09* 0.07*   BNP: Invalid input(s): POCBNP CBG:  Recent Labs Lab 02/05/17 1249 02/05/17 1654 02/05/17 2128 02/06/17 0758 02/06/17 1258  GLUCAP 140* 144* 162* 131* 144*   D-Dimer No results for input(s): DDIMER in the last 72 hours. Hgb A1c No results for input(s): HGBA1C in the last 72 hours. Lipid Profile No results for input(s): CHOL, HDL, LDLCALC, TRIG, CHOLHDL, LDLDIRECT in the last 72 hours. Thyroid function studies No results for input(s): TSH, T4TOTAL, T3FREE, THYROIDAB in the last 72 hours.  Invalid input(s): FREET3 Anemia work up  Recent Labs  02/04/17 1537  VITAMINB12 1,060*  FOLATE 34.0  FERRITIN 432*  TIBC 309  IRON 50  RETICCTPCT 1.5   Urinalysis No results found for: COLORURINE, APPEARANCEUR, LABSPEC, PHURINE, GLUCOSEU, HGBUR, BILIRUBINUR, KETONESUR, PROTEINUR, UROBILINOGEN, NITRITE, LEUKOCYTESUR Sepsis Labs Invalid input(s): PROCALCITONIN,  WBC,   LACTICIDVEN Microbiology No results found for this or any previous visit (from the past 240 hour(s)).  Time coordinating discharge: 35 minutes  SIGNED:  Kerney Elbe, DO [facility] 02/06/2017, 1:47 PM Pager [phone]  If 7PM-7AM, please contact night-coverage [email or web address] Password [email or web address]

## 2017-02-06 NOTE — Progress Notes (Signed)
RN ambulated with pt in hallway on RA. sats 97% RA before walking. sats remained 95-98% during walk. Pt denied SOB. Pt used walker, had good steady pace. Pt states she has a walker and a cane at home and uses when needed.   SATURATION QUALIFICATIONS: (This note is used to comply with regulatory documentation for home oxygen)  Patient Saturations on Room Air at Rest = 97%  Patient Saturations on Room Air while Ambulating = 95-98%  Patient Saturations on 0 Liters of oxygen while Ambulating = %

## 2017-02-06 NOTE — Progress Notes (Signed)
RT Note:  Patient did not wear CPAP HS.

## 2017-02-06 NOTE — Progress Notes (Signed)
Advanced Heart Failure Rounding Note  PCP: Dr. Delfina Redwood Primary Cardiologist: New to [place] (Dr. Haroldine Laws)   Subjective:    Feeling much better. Has more of an understanding of salt/fluid restriction after speaking with HF navigator, but still having trouble grasping. This morning states " but I thought I needed all that water to flush everything out.". Again educated on 2000 mL fluid restriction and encouraged to keep fluid log.   Negative 2.9L. No weight again this am. NT to obtain.   Echo 02/04/17 LVEF 40-45% with restrictive filling pattern and mild mitral stenosis.   Objective:   Weight Range: 203 lb 14.8 oz (92.5 kg) Body mass index is 35 kg/m.   Vital Signs:   Temp:  [98.2 F (36.8 C)-98.7 F (37.1 C)] 98.4 F (36.9 C) (09/12 0537) Pulse Rate:  [79-96] 80 (09/12 0537) Resp:  [18-20] 20 (09/12 0537) BP: (129-142)/(53-66) 129/64 (09/12 0537) SpO2:  [100 %] 100 % (09/12 0537) Weight:  [203 lb 14.4 oz (92.5 kg)-203 lb 14.8 oz (92.5 kg)] 203 lb 14.8 oz (92.5 kg) (09/11 2139) Last BM Date: 02/05/17  Weight change: Filed Weights   02/04/17 2140 02/05/17 0852 02/05/17 2139  Weight: 215 lb (97.5 kg) 203 lb 14.4 oz (92.5 kg) 203 lb 14.8 oz (92.5 kg)    Intake/Output:   Intake/Output Summary (Last 24 hours) at 02/06/17 0850 Last data filed at 02/06/17 0829  Gross per 24 hour  Intake              843 ml  Output             4400 ml  Net            -3557 ml      Physical Exam    General: Elderly appearing. No resp difficulty. HEENT: Normal Neck: Supple. JVP difficult 2/2 body habitus. Appears at least 7-8 cm. Carotids 2+ bilat; no bruits. No thyromegaly or nodule noted. Cor: PMI nondisplaced. RRR, Mild TR murmur.  Lungs: CTAB, normal effort. Abdomen: Soft, non-tender, non-distended, no HSM. No bruits or masses. +BS  Extremities: No cyanosis, clubbing, or rash. 1+ peripheral edema.   Neuro: Alert & orientedx3, cranial nerves grossly intact. moves all 4 extremities  w/o difficulty. Affect pleasant   Telemetry   NSR 80s, Personally reviewed.   EKG    NSR at admit.  Labs    CBC  Recent Labs  02/05/17 0506  WBC 9.2  HGB 9.1*  HCT 28.6*  MCV 66.7*  PLT 562   Basic Metabolic Panel  Recent Labs  02/05/17 0506 02/06/17 0511  NA 138 140  K 4.3 4.4  CL 103 103  CO2 26 30  GLUCOSE 93 123*  BUN 21* 20  CREATININE 1.03* 1.09*  CALCIUM 9.2 9.6   Liver Function Tests No results for input(s): AST, ALT, ALKPHOS, BILITOT, PROT, ALBUMIN in the last 72 hours. No results for input(s): LIPASE, AMYLASE in the last 72 hours. Cardiac Enzymes  Recent Labs  02/03/17 1438 02/03/17 1908 02/04/17 0122  TROPONINI 0.09* 0.09* 0.07*    BNP: BNP (last 3 results)  Recent Labs  02/03/17 0632  BNP 199.2*    ProBNP (last 3 results) No results for input(s): PROBNP in the last 8760 hours.   D-Dimer No results for input(s): DDIMER in the last 72 hours. Hemoglobin A1C No results for input(s): HGBA1C in the last 72 hours. Fasting Lipid Panel No results for input(s): CHOL, HDL, LDLCALC, TRIG, CHOLHDL, LDLDIRECT in the last  72 hours. Thyroid Function Tests No results for input(s): TSH, T4TOTAL, T3FREE, THYROIDAB in the last 72 hours.  Invalid input(s): FREET3  Other results:   Imaging    No results found.   Medications:     Scheduled Medications: . aspirin EC  81 mg Oral Daily  . atorvastatin  20 mg Oral QHS  . calcium citrate  1 tablet Oral BID  . carvedilol  6.25 mg Oral BID WC  . colchicine  0.6 mg Oral BID  . enoxaparin (LOVENOX) injection  40 mg Subcutaneous Q24H  . ferrous sulfate  325 mg Oral BID WC  . insulin aspart  0-5 Units Subcutaneous QHS  . insulin aspart  0-9 Units Subcutaneous TID WC  . insulin aspart  5 Units Subcutaneous Q lunch  . insulin glargine  30 Units Subcutaneous Daily  . insulin glargine  40 Units Subcutaneous Q1400  . irbesartan  75 mg Oral Daily  . latanoprost  1 drop Both Eyes QHS  .  levothyroxine  125 mcg Oral QAC breakfast  . pantoprazole  40 mg Oral Daily  . potassium chloride  40 mEq Oral BID  . sodium chloride flush  3 mL Intravenous Q12H  . spironolactone  25 mg Oral QODAY  . ticagrelor  90 mg Oral BID  . zolpidem  5 mg Oral Once    Infusions: . sodium chloride      PRN Medications: sodium chloride, acetaminophen, ondansetron (ZOFRAN) IV, sodium chloride flush    Patient Profile   Alveena Taira is a 71 y.o. female with h/o HTN, CAD s/p stent x 2 in 2010 with one replaced in 2012, Diabetes, Hypothyroidism, Chronic pain, and OSA.   Admitted with hypoxia requiring CPAP and worsening SOB.  Assessment/Plan   1. Acute hypoxic respiratory failure - Requiring CPAP on admission. - Stable on room air.  2. Acute on chronic systolic CHF due to ICM - Echo 02/04/17 LVEF 40-45% with restrictive filling pattern and mild mitral stenosis.  - NYHA class III symptoms. - Volume status improved.  - Received IV lasix this am. Stop IV diuresis. Transition to torsemide 40 mg BID for home.  - Continue spironolactone 25 mg daily - Change irbesartan back to Losartan 25 mg daily.  - Reinforced fluid restriction to < 2 L daily, sodium restriction to less than 2000 mg daily, and the importance of daily weights.   3. CAD - H/o of stenting in 2010 and 2012. Pt states she went into "cardiac arrest" in 2016 during an attempt at knee surgery. She was taken emergently for cath, Was shocked > 3 times, and had "3" stents placed per her daughter. She states she also had a repeat stent in 2017.  - No s/s of ischemia. Continue ASA and statin.  - Would like to get records from [facility] in [place], [place] daughter says Dr. Curt Bears office has a copy. Per note 01/15/17 records have been received and are in "Chart Prep". Have requested. 3. HTN - Will adjust meds in the setting of treating her HF. No change.  4. DM2 - Per primary.  5. Hypothyroidism - Per primary. 6. PPM - Pt states  indication was "slow heart rate" she is not sure of the manufacturer. She is scheduled to establish with Dr. Macky Lower office.  - Stable. Have sent records request for our office.   7. OSA - Encouraged nightly CPAP compliance.   Stable for home from HF perspective. She has poor insight into her disease and fluid  restriction, so will need close follow up   HF meds for home ( I will order these medications for continuity/refill purposes) Torsemide 40 mg BID Losartan 25 mg daily Spironolactone 25 mg daily Coreg 6.25 mg BID Potassium 40 meq daily Brilinta 90 mg BID Atorvastatin 80 mg daily ASA 81 mg daily  Length of Stay: 3  Shirley Friar, PA-C  02/06/2017, 8:50 AM  Advanced Heart Failure Team Pager [phone] (M-F; 7a - 4p)  Please contact [facility] for night-coverage after hours (4p -7a ) and weekends on [email or web address]  Patient seen and examined with the above-signed Advanced Practice Provider and/or Housestaff. I personally reviewed laboratory data, imaging studies and relevant notes. I independently examined the patient and formulated the important aspects of the plan. I have edited the note to reflect any of my changes or salient points. I have personally discussed the plan with the patient and/or family.  Much improved. [place] for discharge today. Will need to watch volume status and renal function to make sure we don't overdiurese. HF education provided.   Glori Bickers, MD  5:40 PM

## 2017-02-15 ENCOUNTER — Encounter (HOSPITAL_COMMUNITY): Payer: Self-pay

## 2017-02-15 ENCOUNTER — Ambulatory Visit (HOSPITAL_COMMUNITY)
Admit: 2017-02-15 | Discharge: 2017-02-15 | Disposition: A | Discharge status: Home / Self Care | Payer: Medicare Other | Source: Ambulatory Visit | Attending: Cardiology | Admitting: Cardiology

## 2017-02-15 ENCOUNTER — Telehealth (HOSPITAL_COMMUNITY): Payer: Self-pay | Admitting: Cardiology

## 2017-02-15 VITALS — BP 118/56 | HR 86 | Wt 210.0 lb

## 2017-02-15 DIAGNOSIS — Z87891 Personal history of nicotine dependence: Secondary | ICD-10-CM | POA: Diagnosis not present

## 2017-02-15 DIAGNOSIS — R0683 Snoring: Secondary | ICD-10-CM | POA: Diagnosis not present

## 2017-02-15 DIAGNOSIS — Z79899 Other long term (current) drug therapy: Secondary | ICD-10-CM | POA: Diagnosis not present

## 2017-02-15 DIAGNOSIS — I255 Ischemic cardiomyopathy: Secondary | ICD-10-CM | POA: Diagnosis not present

## 2017-02-15 DIAGNOSIS — I2510 Atherosclerotic heart disease of native coronary artery without angina pectoris: Secondary | ICD-10-CM | POA: Diagnosis not present

## 2017-02-15 DIAGNOSIS — E119 Type 2 diabetes mellitus without complications: Secondary | ICD-10-CM | POA: Diagnosis not present

## 2017-02-15 DIAGNOSIS — Z8249 Family history of ischemic heart disease and other diseases of the circulatory system: Secondary | ICD-10-CM | POA: Insufficient documentation

## 2017-02-15 DIAGNOSIS — Z7902 Long term (current) use of antithrombotics/antiplatelets: Secondary | ICD-10-CM | POA: Diagnosis not present

## 2017-02-15 DIAGNOSIS — Z882 Allergy status to sulfonamides status: Secondary | ICD-10-CM | POA: Insufficient documentation

## 2017-02-15 DIAGNOSIS — I5022 Chronic systolic (congestive) heart failure: Secondary | ICD-10-CM | POA: Diagnosis not present

## 2017-02-15 DIAGNOSIS — Z7982 Long term (current) use of aspirin: Secondary | ICD-10-CM | POA: Diagnosis not present

## 2017-02-15 DIAGNOSIS — I5042 Chronic combined systolic (congestive) and diastolic (congestive) heart failure: Secondary | ICD-10-CM

## 2017-02-15 DIAGNOSIS — E039 Hypothyroidism, unspecified: Secondary | ICD-10-CM | POA: Diagnosis not present

## 2017-02-15 DIAGNOSIS — I10 Essential (primary) hypertension: Secondary | ICD-10-CM

## 2017-02-15 DIAGNOSIS — Z794 Long term (current) use of insulin: Secondary | ICD-10-CM | POA: Diagnosis not present

## 2017-02-15 DIAGNOSIS — Z807 Family history of other malignant neoplasms of lymphoid, hematopoietic and related tissues: Secondary | ICD-10-CM | POA: Diagnosis not present

## 2017-02-15 DIAGNOSIS — I110 Hypertensive heart disease with heart failure: Secondary | ICD-10-CM | POA: Insufficient documentation

## 2017-02-15 DIAGNOSIS — Z950 Presence of cardiac pacemaker: Secondary | ICD-10-CM

## 2017-02-15 DIAGNOSIS — G4733 Obstructive sleep apnea (adult) (pediatric): Secondary | ICD-10-CM

## 2017-02-15 DIAGNOSIS — Z955 Presence of coronary angioplasty implant and graft: Secondary | ICD-10-CM | POA: Diagnosis not present

## 2017-02-15 DIAGNOSIS — Z833 Family history of diabetes mellitus: Secondary | ICD-10-CM | POA: Diagnosis not present

## 2017-02-15 DIAGNOSIS — Z9119 Patient's noncompliance with other medical treatment and regimen: Secondary | ICD-10-CM | POA: Diagnosis not present

## 2017-02-15 LAB — BASIC METABOLIC PANEL
ANION GAP: 8 (ref 5–15)
BUN: 56 mg/dL — ABNORMAL HIGH (ref 6–20)
CALCIUM: 9.7 mg/dL (ref 8.9–10.3)
CO2: 27 mmol/L (ref 22–32)
CREATININE: 2.24 mg/dL — AB (ref 0.44–1.00)
Chloride: 102 mmol/L (ref 101–111)
GFR, EST AFRICAN AMERICAN: 24 mL/min — AB (ref >60)
GFR, EST NON AFRICAN AMERICAN: 21 mL/min — AB (ref >60)
Glucose, Bld: 114 mg/dL — ABNORMAL HIGH (ref 65–99)
Potassium: 5.5 mmol/L — ABNORMAL HIGH (ref 3.5–5.1)
SODIUM: 137 mmol/L (ref 135–145)

## 2017-02-15 MED ORDER — LOSARTAN POTASSIUM 25 MG PO TABS: 25.0000 mg | ORAL_TABLET | Freq: Every day | ORAL | 6 refills | Status: DC

## 2017-02-15 MED ORDER — CARVEDILOL 3.125 MG PO TABS: 3.1250 mg | ORAL_TABLET | Freq: Two times a day (BID) | ORAL | 3 refills | Status: DC

## 2017-02-15 NOTE — Telephone Encounter (Signed)
Patient aware recheck labs 9/28

## 2017-02-15 NOTE — Patient Instructions (Addendum)
START Carvedilol 3.125 mg, one tab twice a day START Losartan 25 mg, one tab daily  Labs today We will only contact you if something comes back abnormal or we need to make some changes. Otherwise no news is good news!  You have been referred to Cardiac Rehab, they will contact you for orientation  Address: [address] [place], [place] [address] Phone: [phone]   You have been referred to [facility]- for sleep evaluation  Address: [place] [address]. [address] [place], [place], [place] [address] Phone: [phone]  Your physician has recommended that you have a sleep study. This test records several body functions during sleep, including: brain activity, eye movement, oxygen and carbon dioxide blood levels, heart rate and rhythm, breathing rate and rhythm, the flow of air through your mouth and nose, snoring, body muscle movements, and chest and belly movement.   Your physician recommends that you schedule a follow-up appointment in: 2 weeks  Do the following things EVERYDAY: 1) Weigh yourself in the morning before breakfast. Write it down and keep it in a log. 2) Take your medicines as prescribed 3) Eat low salt foods-Limit salt (sodium) to 2000 mg per day.  4) Stay as active as you can everyday 5) Limit all fluids for the day to less than 2 liters

## 2017-02-15 NOTE — Telephone Encounter (Signed)
-----   Message from Arbutus Leas, NP sent at 02/15/2017  1:47 PM EDT ----- Please have her stop Arlyce Harman and KCl. Recheck next week.

## 2017-02-15 NOTE — Progress Notes (Signed)
Advanced Heart Failure Clinic Note    Primary Care: Dr. Delfina Redwood Primary Cardiologist: Dr. Haroldine Laws   HPI: Danielle [name] is a 71 year old female with a past medical history of systolic CHF (EF 39-03%), CAD, ischemic cardiomyopathy, HTN, DM, hypothyroidism, and OSA.   Admitted 02/03/17-02/06/17 with SOB, acute on chronic systolic CHF. Diuresed with IV lasix (12 pounds), discharge weight was 215 pounds.   Presents today for follow up. Not weighing at home, but weight down 5 pounds since discharge by our scales. She is drinking well over 2L a day and eats ice as well. Says that she balances out her sodium intake, will eat high sodium foods, but then the next day eat barely any sodium. However, when we talk about her diet, it sounds like she eats high salt foods every day. She denies SOB with walking around stores, no SOB with stairs. She has chronic 2 pillow orthopnea. She has not started taking Coreg or losartan since she left the hospital. Denies chest pain, palpitations.   Review of Systems: [y] = yes, [ ] = no   General: Weight gain [ ]; Weight loss [ ]; Anorexia [ ]; Fatigue [ y]; Fever [ ]; Chills [ ]; Weakness [ ]  Cardiac: Chest pain/pressure [ ]; Resting SOB [ ]; Exertional SOB [ ]; Orthopnea [ ]; Pedal Edema [ ]; Palpitations [ ]; Syncope [ ]; Presyncope [ ]; Paroxysmal nocturnal dyspnea[ ]  Pulmonary: Cough [ ]; Wheezing[ ]; Hemoptysis[ ]; Sputum [ ]; Snoring [ ]  GI: Vomiting[ ]; Dysphagia[ ]; Melena[ ]; Hematochezia [ ]; Heartburn[ ]; Abdominal pain [ ]; Constipation [ ]; Diarrhea [ ]; BRBPR [ ]  GU: Hematuria[ ]; Dysuria [ ]; Nocturia[ ]  Vascular: Pain in legs with walking [ ]; Pain in feet with lying flat [ ]; Non-healing sores [ ]; Stroke [ ]; TIA [ ]; Slurred speech [ ];  Neuro: Headaches[ ]; Vertigo[ ]; Seizures[ ]; Paresthesias[ ];Blurred vision [ ]; Diplopia [ ]; Vision changes [ ]  Ortho/Skin: Arthritis Blue.Reese ]; Joint pain Blue.Reese ]; Muscle pain [ ]; Joint swelling [ ]; Back Pain [ ]; Rash  [ ]  Psych: Depression[ ]; Anxiety[ ]  Heme: Bleeding problems [ ]; Clotting disorders [ ]; Anemia [ ]  Endocrine: Diabetes [ ]; Thyroid dysfunction[ ]   Past Medical History:  Diagnosis Date  . Arthritis   . Blood dyscrasia    per pt-has small blood cells-appears as if anemic  . Cancer (HCC)    breast  . CHF (congestive heart failure) ([place] [facility])   . Complication of anesthesia    difficult to awaken from per pt  . Diabetes mellitus without complication ([facility])   . Dyspnea   . Essential hypertension   . Heart disease   . Hypothyroidism   . Presence of permanent cardiac pacemaker   . Sleep apnea   . Thyroid disease    hypothyroid    Current Outpatient Prescriptions  Medication Sig Dispense Refill  . acetaminophen (TYLENOL) 650 MG CR tablet Take 650 mg by mouth every 8 (eight) hours as needed for pain.    . Ascorbic Acid (VITAMIN C PO) Take 1 tablet by mouth daily.    Marland Kitchen aspirin 81 MG tablet Take 1 tablet (81 mg total) by mouth daily. 30 tablet 3  . atorvastatin (LIPITOR) 80 MG tablet Take 1 tablet (80 mg total) by mouth at bedtime. 30 tablet 3  . B Complex-C (SUPER B COMPLEX PO) Take 2  tablets by mouth daily.    . Biotin 10 MG CAPS Take 10 mg by mouth daily.    . calcium citrate (CALCITRATE - DOSED IN MG ELEMENTAL CALCIUM) 950 MG tablet Take 1 tablet by mouth 2 (two) times daily.     . insulin glargine (LANTUS) 100 UNIT/ML injection Inject 40 Units into the skin 2 (two) times daily.    . insulin lispro (HUMALOG) 100 UNIT/ML injection Inject 5 Units into the skin daily with lunch.     . Lactobacillus (ACIDOPHILUS PO) Take 1 capsule by mouth daily.    Marland Kitchen levothyroxine (SYNTHROID, LEVOTHROID) 137 MCG tablet Take 137 mcg by mouth daily before breakfast.     . metFORMIN (GLUCOPHAGE) 500 MG tablet Take 500-1,000 mg by mouth See admin instructions. Take 500 mg by mouth in the morning and take 1000 mg by mouth in the evening    . potassium chloride SA (K-DUR,KLOR-CON) 10 MEQ tablet Take 4  tablets (40 mEq total) by mouth daily. 120 tablet 3  . spironolactone (ALDACTONE) 25 MG tablet Take 1 tablet (25 mg total) by mouth every other day. 30 tablet 3  . ticagrelor (BRILINTA) 90 MG TABS tablet Take 1 tablet (90 mg total) by mouth 2 (two) times daily. 60 tablet 3  . torsemide (DEMADEX) 20 MG tablet Take 40 mg (2 tablets) in the morning and 20 mg (1 tablet) in the afternoon    . Travoprost, BAK Free, (TRAVATAN) 0.004 % SOLN ophthalmic solution Place 1 drop into both eyes daily.    . carvedilol (COREG) 6.25 MG tablet Take 1 tablet (6.25 mg total) by mouth 2 (two) times daily with a meal. (Patient not taking: Reported on 02/15/2017) 60 tablet 6  . CHONDROITIN SULFATE PO Take 250 mg by mouth daily.    . colchicine 0.6 MG tablet Take 0.6 mg by mouth 2 (two) times daily.    . ferrous sulfate 325 (65 FE) MG tablet Take 325 mg by mouth 2 (two) times daily with a meal.    . folic acid (FOLVITE) 1 MG tablet Take 1 mg by mouth daily.    Marland Kitchen losartan (COZAAR) 25 MG tablet Take 1 tablet (25 mg total) by mouth daily. (Patient not taking: Reported on 02/15/2017) 30 tablet 6  . omeprazole (PRILOSEC) 40 MG capsule Take 40 mg by mouth daily.     No current facility-administered medications for this encounter.     Allergies  Allergen Reactions  . Sulfa Antibiotics Rash      Social History   Social History  . Marital status: Married    Spouse name: N/A  . Number of children: N/A  . Years of education: N/A   Occupational History  . Not on file.   Social History Main Topics  . Smoking status: Former Smoker    Packs/day: 1.00    Years: 22.00    Types: Cigarettes    Quit date: 69  . Smokeless tobacco: Never Used  . Alcohol use Yes     Comment: ocassional  . Drug use: No  . Sexual activity: No   Other Topics Concern  . Not on file   Social History Narrative  . No narrative on file      Family History  Problem Relation Age of Onset  . Multiple myeloma Mother   . Heart disease  Father   . Other Sister   . Heart disease Brother   . Diabetes Sister   . Other Sister     Vitals:  02/15/17 1128  BP: (!) 118/56  Pulse: 86  SpO2: 98%  Weight: 210 lb (95.3 kg)     PHYSICAL EXAM: General:  Well appearing. No respiratory difficulty HEENT: normal Neck: supple. 8-9 cm JVD. Carotids 2+ bilat; no bruits. No lymphadenopathy or thyromegaly appreciated. Cor: PMI nondisplaced. Regular rate & rhythm. No rubs, gallops or murmurs. Lungs: clear in upper lobes, diminished in bilateral bases.  Abdomen: soft, nontender, nondistended. No hepatosplenomegaly. No bruits or masses. Good bowel sounds. Extremities: no cyanosis, clubbing, rash. 1+ pedal and pretibial edema bilaterally.  Neuro: alert & oriented x 3, cranial nerves grossly intact. moves all 4 extremities w/o difficulty. Affect pleasant.  ASSESSMENT & PLAN:  1. Chronic systolic CHF: ICM, Echo 5/46/56 EF 45-50%, LA severely dilated, restrictive CM pattern.  - NYHA II-III - Volume elevated on exam likely due to dietary non compliance.  - Continue torsemide 40 qam/20qpm. Had a long discussion about daily weights. Weight chart provided. Advised her to take an extra torsemide if weight increases by 3 pounds in one day or 5 pounds in one week.  - BMET today.  - Will refer to cardiac rehab.  - Start Coreg 3.125 mg BID (Coreg 6.25 mg was continued at discharge, but she has not been taking and BP is soft.). - Start losartan 25 mg daily.  - Continue Spiro 25 mg daily.   2. CAD: Stenting in 2010, 2012 and 2016. Records pending from [place]. - Continue ASA and atorvastatin  3. HTN - Well controlled on current regimen.   4. DM - Follows with Dr. Delfina Redwood.   5. PPM - Pt. States indication was "slow heart rate". Will establish with Dr. Curt Bears next month.   6. OSA - Has been non compliant with CPAP for many years. Will refer to pulmonology.    Follow up in 2 weeks for further med titration.   Arbutus Leas, NP 02/15/17

## 2017-02-18 ENCOUNTER — Encounter: Payer: Self-pay | Admitting: Pulmonary Disease

## 2017-02-18 ENCOUNTER — Ambulatory Visit (INDEPENDENT_AMBULATORY_CARE_PROVIDER_SITE_OTHER): Payer: Medicare Other | Admitting: Pulmonary Disease

## 2017-02-18 DIAGNOSIS — G4733 Obstructive sleep apnea (adult) (pediatric): Secondary | ICD-10-CM | POA: Insufficient documentation

## 2017-02-18 DIAGNOSIS — I255 Ischemic cardiomyopathy: Secondary | ICD-10-CM

## 2017-02-18 HISTORY — DX: Obstructive sleep apnea (adult) (pediatric): G47.33

## 2017-02-18 NOTE — Assessment & Plan Note (Signed)
Given excessive daytime somnolence, narrow pharyngeal exam, witnessed apneas & loud snoring, obstructive sleep apnea is very likely & an overnight polysomnogram will be scheduled as a split study. The pathophysiology of obstructive sleep apnea , it's cardiovascular consequences & modes of treatment including CPAP were discused with the patient in detail & they evidenced understanding.  Pretest probability is intermediate 

## 2017-02-18 NOTE — Patient Instructions (Signed)
We will call you with results of sleep study

## 2017-02-18 NOTE — Progress Notes (Signed)
Subjective:    Patient ID: Danielle [name], female    DOB: [birth year], 71 y.o.   MRN: [id number]  HPI  Chief Complaint  Patient presents with  . Sleep Consult    Referred by Dr. Haroldine Laws for possible OSA. Has a history of CHF.    71 year old recently cut them out with a referred for evaluation of sleep-disordered breathing. She has a permanent pacemaker and has follow up with the CHF service and EP> She reports a diagnosis of obstructive sleep apnea. Years ago when she was living up in [place] and was placed on CPAP, unfortunately she did not tolerate this well and stopped using this after a few weeks. She does not have the machine anymore. She moved to [place] to be closer to her daughter in May 2018. She had a hospital admission 9/18 for acute decompensation of congestive heart failure, last EF was documented as 45-50%. Epworth sleepiness score is 10. She takes daily afternoon nap 15-30 minutes which is refreshing. Loud snoring has been noted by family members. Bedtime is between 11 PM and midnight, sleep latency is about 30 minutes, she sleeps on her left side with 2 pillows, reports 4-5 nocturnal awakenings including nocturia and is out of bed by 8 AM feeling tired with dryness of mouth and occasional headache. She has always been 200+ pounds and has been unable to lose weight There is no history suggestive of cataplexy, sleep paralysis or parasomnias  She is trying to control the sodium in her diet    Past Medical History:  Diagnosis Date  . Arthritis   . Blood dyscrasia    per pt-has small blood cells-appears as if anemic  . Cancer (HCC)    breast  . CHF (congestive heart failure) ([facility])   . Complication of anesthesia    difficult to awaken from per pt  . Diabetes mellitus without complication ([place])   . Dyspnea   . Essential hypertension   . Heart disease   . Hypothyroidism   . Presence of permanent cardiac pacemaker   . Sleep apnea   . Thyroid disease    hypothyroid   Past Surgical History:  Procedure Laterality Date  . BREAST LUMPECTOMY WITH NEEDLE LOCALIZATION AND AXILLARY LYMPH NODE DISSECTION  1985  . BREAST SURGERY Left    partial mastectomy  . CORONARY STENT PLACEMENT  2010, 2012,2017    2 done 2010 and 1 replaced 2012 and 2 replaced in 2017  . EYE SURGERY Bilateral    bilateral lens implant  . INSERT / REPLACE / REMOVE PACEMAKER  07/19/2014  . REPLACEMENT TOTAL KNEE Left 09/2012  . THYROIDECTOMY, PARTIAL  1978  . TONSILLECTOMY       Allergies  Allergen Reactions  . Sulfa Antibiotics Rash      Social History   Social History  . Marital status: Married    Spouse name: N/A  . Number of children: N/A  . Years of education: N/A   Occupational History  . Not on file.   Social History Main Topics  . Smoking status: Former Smoker    Packs/day: 1.00    Years: 22.00    Types: Cigarettes    Quit date: 21  . Smokeless tobacco: Never Used  . Alcohol use Yes     Comment: ocassional  . Drug use: No  . Sexual activity: No   Other Topics Concern  . Not on file   Social History Narrative  . No narrative on file  Family History  Problem Relation Age of Onset  . Multiple myeloma Mother   . Heart disease Father   . Other Sister   . Heart disease Brother   . Diabetes Sister   . Other Sister      Review of Systems  Constitutional: negative for anorexia, fevers and sweats  Eyes: negative for irritation, redness and visual disturbance  Ears, nose, mouth, throat, and face: negative for earaches, epistaxis, nasal congestion and sore throat  Respiratory: negative for cough, dyspnea on exertion, sputum and wheezing  Cardiovascular: negative for chest pain, dyspnea, lower extremity edema, orthopnea, palpitations and syncope  Gastrointestinal: negative for abdominal pain, constipation, diarrhea, melena, nausea and vomiting  Genitourinary:negative for dysuria, frequency and hematuria  Hematologic/lymphatic:  negative for bleeding, easy bruising and lymphadenopathy  Musculoskeletal:negative for arthralgias, muscle weakness and stiff joints  Neurological: negative for coordination problems, gait problems, headaches and weakness  Endocrine: negative for diabetic symptoms including polydipsia, polyuria and weight loss     Objective:   Physical Exam  Gen. Pleasant, obese, in no distress ENT - no lesions, no post nasal drip Neck: No JVD, no thyromegaly, no carotid bruits Lungs: no use of accessory muscles, no dullness to percussion, decreased without rales or rhonchi  Cardiovascular: Rhythm regular, heart sounds  normal, no murmurs or gallops, no peripheral edema Musculoskeletal: No deformities, no cyanosis or clubbing , no tremors        Assessment & Plan:

## 2017-02-19 ENCOUNTER — Other Ambulatory Visit (HOSPITAL_COMMUNITY): Payer: Self-pay

## 2017-02-19 DIAGNOSIS — I509 Heart failure, unspecified: Secondary | ICD-10-CM

## 2017-02-21 ENCOUNTER — Telehealth (HOSPITAL_COMMUNITY): Payer: Self-pay | Admitting: *Deleted

## 2017-02-21 NOTE — Telephone Encounter (Signed)
Advanced Heart Failure Triage Encounter  Patient Name: Danielle [name]   Date of Call: 02/21/2017  Problem:  Patient called c/o vertigo and nausea. Patient stated these symptoms started when she began taking new medications. Carvedilol and Losartan.  Plan:  Per [name] hold her medications and come in for BMET today. Patient already took meds today and is home alone with no transportation. She is already scheduled for BMET tomorrow. She was advised to keep her lab appt and to hold her meds in the morning. Pt aware and agreeable.   Harvie Junior, CMA

## 2017-02-22 ENCOUNTER — Ambulatory Visit (HOSPITAL_COMMUNITY)
Admission: RE | Admit: 2017-02-22 | Discharge: 2017-02-22 | Disposition: A | Discharge status: Home / Self Care | Payer: Medicare Other | Source: Ambulatory Visit | Attending: Cardiology | Admitting: Cardiology

## 2017-02-22 ENCOUNTER — Encounter: Payer: Self-pay | Admitting: Cardiology

## 2017-02-22 DIAGNOSIS — I5042 Chronic combined systolic (congestive) and diastolic (congestive) heart failure: Secondary | ICD-10-CM | POA: Diagnosis not present

## 2017-02-22 LAB — BASIC METABOLIC PANEL
ANION GAP: 13 (ref 5–15)
BUN: 43 mg/dL — ABNORMAL HIGH (ref 6–20)
CO2: 24 mmol/L (ref 22–32)
Calcium: 9.6 mg/dL (ref 8.9–10.3)
Chloride: 101 mmol/L (ref 101–111)
Creatinine, Ser: 1.45 mg/dL — ABNORMAL HIGH (ref 0.44–1.00)
GFR, EST AFRICAN AMERICAN: 41 mL/min — AB (ref >60)
GFR, EST NON AFRICAN AMERICAN: 36 mL/min — AB (ref >60)
Glucose, Bld: 81 mg/dL (ref 65–99)
POTASSIUM: 4.5 mmol/L (ref 3.5–5.1)
SODIUM: 138 mmol/L (ref 135–145)

## 2017-02-28 ENCOUNTER — Telehealth (HOSPITAL_COMMUNITY): Payer: Self-pay

## 2017-02-28 NOTE — Telephone Encounter (Signed)
CHF Clinic appointment reminder call placed to patient for upcoming appointment.  Does understand purpose of this appointment and where CHF Clinic is located? Yes  How is patient feeling? Well, no complaints  Does patient have all of their medications? Yes  Patient also reminded to take all medications as prescribed on the day of his/her appointment and to bring all medications to this appointment.  Advised to call our office for tardiness or cancellations/rescheduling needs.  .Neva Ramaswamy Genevea  

## 2017-03-01 ENCOUNTER — Ambulatory Visit (HOSPITAL_COMMUNITY)
Admission: RE | Admit: 2017-03-01 | Discharge: 2017-03-01 | Disposition: A | Discharge status: Home / Self Care | Payer: Medicare Other | Source: Ambulatory Visit | Attending: Internal Medicine | Admitting: Internal Medicine

## 2017-03-01 VITALS — BP 168/68 | HR 94 | Wt 213.4 lb

## 2017-03-01 DIAGNOSIS — Z882 Allergy status to sulfonamides status: Secondary | ICD-10-CM | POA: Diagnosis not present

## 2017-03-01 DIAGNOSIS — Z794 Long term (current) use of insulin: Secondary | ICD-10-CM | POA: Insufficient documentation

## 2017-03-01 DIAGNOSIS — I5042 Chronic combined systolic (congestive) and diastolic (congestive) heart failure: Secondary | ICD-10-CM | POA: Diagnosis not present

## 2017-03-01 DIAGNOSIS — I255 Ischemic cardiomyopathy: Secondary | ICD-10-CM | POA: Insufficient documentation

## 2017-03-01 DIAGNOSIS — R0602 Shortness of breath: Secondary | ICD-10-CM | POA: Insufficient documentation

## 2017-03-01 DIAGNOSIS — Z9111 Patient's noncompliance with dietary regimen: Secondary | ICD-10-CM | POA: Diagnosis not present

## 2017-03-01 DIAGNOSIS — I425 Other restrictive cardiomyopathy: Secondary | ICD-10-CM | POA: Insufficient documentation

## 2017-03-01 DIAGNOSIS — Z8489 Family history of other specified conditions: Secondary | ICD-10-CM | POA: Diagnosis not present

## 2017-03-01 DIAGNOSIS — Z9119 Patient's noncompliance with other medical treatment and regimen: Secondary | ICD-10-CM | POA: Diagnosis not present

## 2017-03-01 DIAGNOSIS — I110 Hypertensive heart disease with heart failure: Secondary | ICD-10-CM | POA: Insufficient documentation

## 2017-03-01 DIAGNOSIS — E039 Hypothyroidism, unspecified: Secondary | ICD-10-CM | POA: Diagnosis not present

## 2017-03-01 DIAGNOSIS — I5022 Chronic systolic (congestive) heart failure: Secondary | ICD-10-CM | POA: Diagnosis not present

## 2017-03-01 DIAGNOSIS — Z833 Family history of diabetes mellitus: Secondary | ICD-10-CM | POA: Insufficient documentation

## 2017-03-01 DIAGNOSIS — Z8249 Family history of ischemic heart disease and other diseases of the circulatory system: Secondary | ICD-10-CM | POA: Diagnosis not present

## 2017-03-01 DIAGNOSIS — Z853 Personal history of malignant neoplasm of breast: Secondary | ICD-10-CM | POA: Insufficient documentation

## 2017-03-01 DIAGNOSIS — R001 Bradycardia, unspecified: Secondary | ICD-10-CM | POA: Diagnosis not present

## 2017-03-01 DIAGNOSIS — R0683 Snoring: Secondary | ICD-10-CM

## 2017-03-01 DIAGNOSIS — Z7982 Long term (current) use of aspirin: Secondary | ICD-10-CM | POA: Diagnosis not present

## 2017-03-01 DIAGNOSIS — Z950 Presence of cardiac pacemaker: Secondary | ICD-10-CM | POA: Diagnosis not present

## 2017-03-01 DIAGNOSIS — I10 Essential (primary) hypertension: Secondary | ICD-10-CM

## 2017-03-01 DIAGNOSIS — E119 Type 2 diabetes mellitus without complications: Secondary | ICD-10-CM | POA: Diagnosis not present

## 2017-03-01 DIAGNOSIS — Z87891 Personal history of nicotine dependence: Secondary | ICD-10-CM | POA: Insufficient documentation

## 2017-03-01 DIAGNOSIS — G4733 Obstructive sleep apnea (adult) (pediatric): Secondary | ICD-10-CM

## 2017-03-01 DIAGNOSIS — I2510 Atherosclerotic heart disease of native coronary artery without angina pectoris: Secondary | ICD-10-CM | POA: Diagnosis not present

## 2017-03-01 DIAGNOSIS — M1990 Unspecified osteoarthritis, unspecified site: Secondary | ICD-10-CM | POA: Diagnosis not present

## 2017-03-01 DIAGNOSIS — Z79899 Other long term (current) drug therapy: Secondary | ICD-10-CM | POA: Insufficient documentation

## 2017-03-01 LAB — BASIC METABOLIC PANEL
ANION GAP: 9 (ref 5–15)
BUN: 27 mg/dL — ABNORMAL HIGH (ref 6–20)
CO2: 27 mmol/L (ref 22–32)
Calcium: 9.4 mg/dL (ref 8.9–10.3)
Chloride: 104 mmol/L (ref 101–111)
Creatinine, Ser: 1.36 mg/dL — ABNORMAL HIGH (ref 0.44–1.00)
GFR, EST AFRICAN AMERICAN: 45 mL/min — AB (ref >60)
GFR, EST NON AFRICAN AMERICAN: 38 mL/min — AB (ref >60)
Glucose, Bld: 178 mg/dL — ABNORMAL HIGH (ref 65–99)
POTASSIUM: 4.3 mmol/L (ref 3.5–5.1)
SODIUM: 140 mmol/L (ref 135–145)

## 2017-03-01 MED ORDER — POLYETHYLENE GLYCOL 3350 17 G PO PACK: 17.0000 g | PACK | Freq: Every day | ORAL | 0 refills | Status: DC

## 2017-03-01 MED ORDER — TORSEMIDE 20 MG PO TABS: 40.0000 mg | ORAL_TABLET | Freq: Two times a day (BID) | ORAL | 6 refills | Status: DC

## 2017-03-01 NOTE — Progress Notes (Signed)
Advanced Heart Failure Clinic Note    Primary Care: Dr. Delfina Redwood Primary Cardiologist: Dr. Haroldine Laws   HPI: Danielle [name] is a 71 year old female with a past medical history of systolic CHF (EF 76-73%), CAD, ischemic cardiomyopathy, HTN, DM, hypothyroidism, and OSA.   Admitted 02/03/17-02/06/17 with SOB, acute on chronic systolic CHF. Diuresed with IV lasix (12 pounds), discharge weight was 215 pounds.   Presents today for HF follow up. Feels fatigued. Weight trending up at home, weight up 3 pounds by our scales. She has lower extremity edema. + orthopnea. Does not feel SOB with walking into clinic or walking throughout her house. She eating many high salt foods including bacon and sausage for breakfast, also eating canned soups. Drinking more than 2L a day. Takes her medications, but some days she will only take torsemide once a day to avoid having to pee. Also thinks that she is taking too many pills. Worries that too many pills will make her pee too much. Says that she is losing weight but also says that her weight is going up at home. Scattered thought process.     Review of Systems: [y] = yes, [ ]  = no   General: Weight gain Blue.Reese ]; Weight loss [ ] ; Anorexia [ ] ; Fatigue [ ] ; Fever [ ] ; Chills [ ] ; Weakness [ ]   Cardiac: Chest pain/pressure [ ] ; Resting SOB [ ] ; Exertional SOB [ ] ; Orthopnea [ y]; Pedal Edema [ ] ; Palpitations [ ] ; Syncope [ ] ; Presyncope [ ] ; Paroxysmal nocturnal dyspnea[ ]   Pulmonary: Cough [ ] ; Wheezing[ ] ; Hemoptysis[ ] ; Sputum [ ] ; Snoring [ ]   GI: Vomiting[ ] ; Dysphagia[ ] ; Melena[ ] ; Hematochezia [ ] ; Heartburn[ ] ; Abdominal pain [ ] ; Constipation [ ] ; Diarrhea [ ] ; BRBPR [ ]   GU: Hematuria[ ] ; Dysuria [ ] ; Nocturia[ ]   Vascular: Pain in legs with walking [ ] ; Pain in feet with lying flat [ ] ; Non-healing sores [ ] ; Stroke [ ] ; TIA [ ] ; Slurred speech [ ] ;  Neuro: Headaches[ ] ; Vertigo[ ] ; Seizures[ ] ; Paresthesias[ ] ;Blurred vision [ ] ; Diplopia [ ] ; Vision changes [ ]     Ortho/Skin: Arthritis Blue.Reese ]; Joint pain [ ] ; Muscle pain [ ] ; Joint swelling [ ] ; Back Pain [ ] ; Rash [ ]   Psych: Depression[ ] ; Anxiety[ ]   Heme: Bleeding problems [ ] ; Clotting disorders [ ] ; Anemia [ ]   Endocrine: Diabetes [ ] ; Thyroid dysfunction[ ]    Past Medical History:  Diagnosis Date  . Arthritis   . Blood dyscrasia    per pt-has small blood cells-appears as if anemic  . Cancer (HCC)    breast  . CHF (congestive heart failure) ([facility])   . Complication of anesthesia    difficult to awaken from per pt  . Diabetes mellitus without complication ([facility])   . Dyspnea   . Essential hypertension   . Heart disease   . Hypothyroidism   . Presence of permanent cardiac pacemaker   . Sleep apnea   . Thyroid disease    hypothyroid    Current Outpatient Prescriptions  Medication Sig Dispense Refill  . acetaminophen (TYLENOL) 650 MG CR tablet Take 650 mg by mouth every 8 (eight) hours as needed for pain.    . Ascorbic Acid (VITAMIN C PO) Take 1 tablet by mouth daily.    Marland Kitchen aspirin 81 MG tablet Take 1 tablet (81 mg total) by mouth daily. 30 tablet 3  . atorvastatin (LIPITOR) 80 MG tablet Take 1 tablet (80  mg total) by mouth at bedtime. 30 tablet 3  . B Complex-C (SUPER B COMPLEX PO) Take 2 tablets by mouth daily.    . Biotin 10 MG CAPS Take 10 mg by mouth daily.    . calcium citrate (CALCITRATE - DOSED IN MG ELEMENTAL CALCIUM) 950 MG tablet Take 1 tablet by mouth 2 (two) times daily.     . carvedilol (COREG) 3.125 MG tablet Take 1 tablet (3.125 mg total) by mouth 2 (two) times daily with a meal. 60 tablet 3  . CHONDROITIN SULFATE PO Take 250 mg by mouth daily.    . colchicine 0.6 MG tablet Take 0.6 mg by mouth 2 (two) times daily.    . ferrous sulfate 325 (65 FE) MG tablet Take 325 mg by mouth 2 (two) times daily with a meal.    . folic acid (FOLVITE) 1 MG tablet Take 1 mg by mouth daily.    . insulin glargine (LANTUS) 100 UNIT/ML injection Inject 40 Units into the skin 2 (two) times  daily.    . insulin lispro (HUMALOG) 100 UNIT/ML injection Inject 5 Units into the skin daily with lunch.     . Lactobacillus (ACIDOPHILUS PO) Take 1 capsule by mouth daily.    Marland Kitchen levothyroxine (SYNTHROID, LEVOTHROID) 137 MCG tablet Take 137 mcg by mouth daily before breakfast.     . losartan (COZAAR) 25 MG tablet Take 1 tablet (25 mg total) by mouth daily. 30 tablet 6  . metFORMIN (GLUCOPHAGE) 500 MG tablet Take 500-1,000 mg by mouth See admin instructions. Take 500 mg by mouth in the morning and take 1000 mg by mouth in the evening    . omeprazole (PRILOSEC) 40 MG capsule Take 40 mg by mouth daily.    . ticagrelor (BRILINTA) 90 MG TABS tablet Take 1 tablet (90 mg total) by mouth 2 (two) times daily. 60 tablet 3  . torsemide (DEMADEX) 20 MG tablet Take 40 mg (2 tablets) in the morning and 20 mg (1 tablet) in the afternoon    . Travoprost, BAK Free, (TRAVATAN) 0.004 % SOLN ophthalmic solution Place 1 drop into both eyes daily.     No current facility-administered medications for this encounter.     Allergies  Allergen Reactions  . Sulfa Antibiotics Rash      Social History   Social History  . Marital status: Married    Spouse name: N/A  . Number of children: N/A  . Years of education: N/A   Occupational History  . Not on file.   Social History Main Topics  . Smoking status: Former Smoker    Packs/day: 1.00    Years: 22.00    Types: Cigarettes    Quit date: 34  . Smokeless tobacco: Never Used  . Alcohol use Yes     Comment: ocassional  . Drug use: No  . Sexual activity: No   Other Topics Concern  . Not on file   Social History Narrative  . No narrative on file      Family History  Problem Relation Age of Onset  . Multiple myeloma Mother   . Heart disease Father   . Other Sister   . Heart disease Brother   . Diabetes Sister   . Other Sister     Vitals:   03/01/17 1035  BP: (!) 168/68  Pulse: 94  SpO2: 99%  Weight: 213 lb 6 oz (96.8 kg)     PHYSICAL  EXAM: General: Well appearing. No resp difficulty. HEENT:  Normal Neck: Supple. JVP 9-10 cm. Carotids 2+ bilat; no bruits. No thyromegaly or nodule noted. Cor: PMI nondisplaced. RRR, No M/G/R noted Lungs: CTAB, normal effort. Abdomen: Soft, non-tender, non-distended, no HSM. No bruits or masses. +BS  Extremities: No cyanosis, clubbing, rash, 2+ edema to knees.  Neuro: Alert & orientedx3, cranial nerves grossly intact. moves all 4 extremities w/o difficulty. Affect pleasant   ASSESSMENT & PLAN:  1. Chronic systolic CHF: ICM, Echo 11/23/45 EF 45-50%, LA severely dilated, restrictive CM pattern.  - NYHA II-III - Volume elevated on exam in the setting of dietary non compliance and intermittent compliance with torsemide. I talked to her about reducing salt in her diet, especially processed meats and canned foods. She will try to limit these foods. Increase torsemide to 40 mg BID. I told her she definitely has to take torsemide BID.  - BMET today - She would benefit from Bidil for BP control, afterload reduction. However, she says she is taking too many pills and thinks that this would lower her BP too much and therefore refuses. For that reason, will concentrate on her proper diuretic regimen this visit and will schedule her to see HF pharmD next week for BP control and to check weight. Consider adding Bidil at that visit.  - Continue losartan 25 mg daily.  - No Spiro with hyperkalemia - Continue Coreg 3.125 mg BID. Also consider increasing at next visit.   2. CAD: Stenting in 2010, 2012 and 2016. Records pending from [place]. - Denies chest pain  - Continue ASA and statin.   3. HTN - Elevated today, see above discussion.   4. DM - Follows with Dr. Delfina Redwood.   5. PPM - Says this was placed due to "slow heart rate". She will establish with Dr. Curt Bears next week.   6. OSA - Non compliant with CPAP.  - Saw Dr. Elsworth Soho, plan for split night study.    Follow up next week for visit with PharmD for  further med titration. BMET today.   Arbutus Leas, NP 03/01/17

## 2017-03-01 NOTE — Patient Instructions (Addendum)
Routine lab work today. Will notify you of abnormal results, otherwise no news is good news!  INCREASE Torsemide to 40 mg (2 tabs) twice daily.  Follow up with CHF clinical pharmacist next week for blood pressure check and medication titration if needed.  _______________________________________________________________________ [name] Code: 8000  Follow up 6-8 weeks.  _______________________________________________________________________ [name] Code: 0174  Take all medication as prescribed the day of your appointment. Bring all medications with you to your appointment.  Do the following things EVERYDAY: 1) Weigh yourself in the morning before breakfast. Write it down and keep it in a log. 2) Take your medicines as prescribed 3) Eat low salt foods-Limit salt (sodium) to 2000 mg per day.  4) Stay as active as you can everyday 5) Limit all fluids for the day to less than 2 liters

## 2017-03-01 NOTE — Progress Notes (Signed)
168/68 

## 2017-03-04 ENCOUNTER — Encounter: Payer: Medicare Other | Admitting: Cardiology

## 2017-03-05 ENCOUNTER — Ambulatory Visit (INDEPENDENT_AMBULATORY_CARE_PROVIDER_SITE_OTHER): Payer: Medicare Other | Admitting: Cardiology

## 2017-03-05 ENCOUNTER — Encounter: Payer: Self-pay | Admitting: Cardiology

## 2017-03-05 VITALS — BP 134/70 | HR 97 | Ht 64.0 in | Wt 211.8 lb

## 2017-03-05 DIAGNOSIS — I255 Ischemic cardiomyopathy: Secondary | ICD-10-CM | POA: Diagnosis not present

## 2017-03-05 DIAGNOSIS — Z45018 Encounter for adjustment and management of other part of cardiac pacemaker: Secondary | ICD-10-CM

## 2017-03-05 DIAGNOSIS — I10 Essential (primary) hypertension: Secondary | ICD-10-CM

## 2017-03-05 DIAGNOSIS — R0789 Other chest pain: Secondary | ICD-10-CM

## 2017-03-05 DIAGNOSIS — I495 Sick sinus syndrome: Secondary | ICD-10-CM | POA: Diagnosis not present

## 2017-03-05 DIAGNOSIS — I25118 Atherosclerotic heart disease of native coronary artery with other forms of angina pectoris: Secondary | ICD-10-CM

## 2017-03-05 MED ORDER — ISOSORBIDE MONONITRATE ER 30 MG PO TB24: 30.0000 mg | ORAL_TABLET | Freq: Every day | ORAL | 3 refills | Status: DC

## 2017-03-05 MED ORDER — NITROGLYCERIN 0.4 MG SL SUBL
0.4000 mg | SUBLINGUAL_TABLET | SUBLINGUAL | Status: DC | PRN
  Administered 2017-03-05: 0.4 mg via SUBLINGUAL

## 2017-03-05 MED ORDER — CARVEDILOL 6.25 MG PO TABS: 6.2500 mg | ORAL_TABLET | Freq: Two times a day (BID) | ORAL | 3 refills | Status: DC

## 2017-03-05 MED ORDER — NITROGLYCERIN 0.4 MG SL SUBL
0.4000 mg | SUBLINGUAL_TABLET | SUBLINGUAL | 0 refills | Status: DC | PRN
Start: 2017-03-05 — End: 2018-01-12

## 2017-03-05 NOTE — Addendum Note (Signed)
Addended by: Frederik Schmidt on: 03/05/2017 01:07 PM   Modules accepted: Orders

## 2017-03-05 NOTE — Patient Instructions (Addendum)
Medication Instructions:  Your physician has recommended you make the following change in your medication:   1.)  Increase coreg to 6.25mg  twice daily  2.) Start Imdur 30mg  daily.  3.) Nitro as needed.  -- If you need a refill on your cardiac medications before your next appointment, please call your pharmacy. --  Labwork: Your physician recommends that you return for lab work:  Lipid Panel  (you will need to fast for this lab)   Testing/Procedures: None ordered  Follow-Up: Your physician wants you to follow-up in: 3 months with Dr. Curt Bears.   You will receive a reminder letter in the mail two months in advance. If you don't receive a letter, please call our office to schedule the follow-up appointment.  Thank you for choosing [facility]!!    Any Other Special Instructions Will Be Listed Below (If Applicable).  Nitroglycerin sublingual tablets What is this medicine? NITROGLYCERIN (nye troe GLI ser in) is a type of vasodilator. It relaxes blood vessels, increasing the blood and oxygen supply to your heart. This medicine is used to relieve chest pain caused by angina. It is also used to prevent chest pain before activities like climbing stairs, going outdoors in cold weather, or sexual activity. This medicine may be used for other purposes; ask your health care provider or pharmacist if you have questions. COMMON BRAND NAME(S): Nitroquick, Nitrostat, Nitrotab What should I tell my health care provider before I take this medicine? They need to know if you have any of these conditions: -anemia -head injury, recent stroke, or bleeding in the brain -liver disease -previous heart attack -an unusual or allergic reaction to nitroglycerin, other medicines, foods, dyes, or preservatives -pregnant or trying to get pregnant -breast-feeding How should I use this medicine? Take this medicine by mouth as needed. At the first sign of an angina attack (chest pain or tightness) place one  tablet under your tongue. You can also take this medicine 5 to 10 minutes before an event likely to produce chest pain. Follow the directions on the prescription label. Let the tablet dissolve under the tongue. Do not swallow whole. Replace the dose if you accidentally swallow it. It will help if your mouth is not dry. Saliva around the tablet will help it to dissolve more quickly. Do not eat or drink, smoke or chew tobacco while a tablet is dissolving. If you are not better within 5 minutes after taking ONE dose of nitroglycerin, call 9-1-1 immediately to seek emergency medical care. Do not take more than 3 nitroglycerin tablets over 15 minutes. If you take this medicine often to relieve symptoms of angina, your doctor or health care professional may provide you with different instructions to manage your symptoms. If symptoms do not go away after following these instructions, it is important to call 9-1-1 immediately. Do not take more than 3 nitroglycerin tablets over 15 minutes. Talk to your pediatrician regarding the use of this medicine in children. Special care may be needed. Overdosage: If you think you have taken too much of this medicine contact a poison control center or emergency room at once. NOTE: This medicine is only for you. Do not share this medicine with others. What if I miss a dose? This does not apply. This medicine is only used as needed. What may interact with this medicine? Do not take this medicine with any of the following medications: -certain migraine medicines like ergotamine and dihydroergotamine (DHE) -medicines used to treat erectile dysfunction like sildenafil, tadalafil, and vardenafil -  riociguat This medicine may also interact with the following medications: -alteplase -aspirin -heparin -medicines for high blood pressure -medicines for mental depression -other medicines used to treat angina -phenothiazines like chlorpromazine, mesoridazine, prochlorperazine,  thioridazine This list may not describe all possible interactions. Give your health care provider a list of all the medicines, herbs, non-prescription drugs, or dietary supplements you use. Also tell them if you smoke, drink alcohol, or use illegal drugs. Some items may interact with your medicine. What should I watch for while using this medicine? Tell your doctor or health care professional if you feel your medicine is no longer working. Keep this medicine with you at all times. Sit or lie down when you take your medicine to prevent falling if you feel dizzy or faint after using it. Try to remain calm. This will help you to feel better faster. If you feel dizzy, take several deep breaths and lie down with your feet propped up, or bend forward with your head resting between your knees. You may get drowsy or dizzy. Do not drive, use machinery, or do anything that needs mental alertness until you know how this drug affects you. Do not stand or sit up quickly, especially if you are an older patient. This reduces the risk of dizzy or fainting spells. Alcohol can make you more drowsy and dizzy. Avoid alcoholic drinks. Do not treat yourself for coughs, colds, or pain while you are taking this medicine without asking your doctor or health care professional for advice. Some ingredients may increase your blood pressure. What side effects may I notice from receiving this medicine? Side effects that you should report to your doctor or health care professional as soon as possible: -blurred vision -dry mouth -skin rash -sweating -the feeling of extreme pressure in the head -unusually weak or tired Side effects that usually do not require medical attention (report to your doctor or health care professional if they continue or are bothersome): -flushing of the face or neck -headache -irregular heartbeat, palpitations -nausea, vomiting This list may not describe all possible side effects. Call your doctor for  medical advice about side effects. You may report side effects to FDA at 1-800-FDA-1088. Where should I keep my medicine? Keep out of the reach of children. Store at room temperature between 20 and 25 degrees C (68 and 77 degrees F). Store in [facility] of [employer]. Protect from light and moisture. Keep tightly closed. Throw away any unused medicine after the expiration date. NOTE: This sheet is a summary. It may not cover all possible information. If you have questions about this medicine, talk to your doctor, pharmacist, or health care provider.  2018 Elsevier/Gold Standard (2013-03-12 17:57:36)

## 2017-03-05 NOTE — Progress Notes (Signed)
Electrophysiology Office Note   Date:  03/05/2017   ID:  Shakendra Griffeth, DOB 28-Sep-1945, MRN [id number]  PCP:  Seward Carol, MD  Cardiologist:  [place] Primary Electrophysiologist:  Tamaj Jurgens Meredith Leeds, MD    Chief Complaint  Patient presents with  . Pacemaker Check  . Establish Care     History of Present Illness: Danielle [name] is a 71 y.o. female who is being seen today for the evaluation of CHF at the request of Seward Carol, MD. Presenting today for electrophysiology evaluation. Has a history of systolic heart failure with an EF 45-50%, coronary disease, nonischemic cardiomyopathy, hypertension, diabetes, hypothyroidism, and sleep apnea. She had PCI to the LAD in 2007. She was planned for elective right knee replacement February 2016. After induction of anesthesia, the patient became hypotensive and bradycardic. EKG showed ST elevation in the anterior wall. She had an emergent cath and found her LAD stent thrombosed. Her ejection fraction time was 10% which improved to 45-50%. She had greater than 3 second pauses and had a Medtronic dual-chamber pacemaker implanted 07/19/14.   She was admitted 9/9-9/12/18 with shortness of breath and acute on chronic diastolic heart failure. He was diuresed 12 pounds with a discharge weight of 215 pounds.   Today, she denies symptoms of palpitations, orthopnea, PND, lower extremity edema, claudication, dizziness, presyncope, syncope, bleeding, or neurologic sequela. The patient is tolerating medications without difficulties. She is currently having chest discomfort. Her chest pain has been occurring for approximately a month. It does not worsen with exertion, but it is improved with rest. She feels that it may be due to indigestion, but cannot rule out her heart. She also gets short of breath with exertion or talking for long periods of time.    Past Medical History:  Diagnosis Date  . Arthritis   . Blood dyscrasia    per pt-has small blood  cells-appears as if anemic  . Cancer (HCC)    breast  . CHF (congestive heart failure) ([place])   . Complication of anesthesia    difficult to awaken from per pt  . Diabetes mellitus without complication ([place])   . Dyspnea   . Essential hypertension   . Heart disease   . Hypothyroidism   . Presence of permanent cardiac pacemaker   . Sleep apnea   . Thyroid disease    hypothyroid   Past Surgical History:  Procedure Laterality Date  . BREAST LUMPECTOMY WITH NEEDLE LOCALIZATION AND AXILLARY LYMPH NODE DISSECTION  1985  . BREAST SURGERY Left    partial mastectomy  . CORONARY STENT PLACEMENT  2010, 2012,2017    2 done 2010 and 1 replaced 2012 and 2 replaced in 2017  . EYE SURGERY Bilateral    bilateral lens implant  . INSERT / REPLACE / REMOVE PACEMAKER  07/19/2014  . REPLACEMENT TOTAL KNEE Left 09/2012  . THYROIDECTOMY, PARTIAL  1978  . TONSILLECTOMY       Current Outpatient Prescriptions  Medication Sig Dispense Refill  . acetaminophen (TYLENOL) 650 MG CR tablet Take 650 mg by mouth every 8 (eight) hours as needed for pain.    . Ascorbic Acid (VITAMIN C PO) Take 1 tablet by mouth daily.    Marland Kitchen aspirin 81 MG tablet Take 1 tablet (81 mg total) by mouth daily. 30 tablet 3  . atorvastatin (LIPITOR) 80 MG tablet Take 1 tablet (80 mg total) by mouth at bedtime. 30 tablet 3  . B Complex-C (SUPER B COMPLEX PO) Take 2 tablets by mouth  daily.    . Biotin 10 MG CAPS Take 10 mg by mouth daily.    . calcium citrate (CALCITRATE - DOSED IN MG ELEMENTAL CALCIUM) 950 MG tablet Take 1 tablet by mouth 2 (two) times daily.     . CHONDROITIN SULFATE PO Take 250 mg by mouth daily.    . colchicine 0.6 MG tablet Take 0.6 mg by mouth 2 (two) times daily.    . ferrous sulfate 325 (65 FE) MG tablet Take 325 mg by mouth 2 (two) times daily with a meal.    . folic acid (FOLVITE) 1 MG tablet Take 1 mg by mouth daily.    . insulin glargine (LANTUS) 100 UNIT/ML injection Inject 40 Units into the skin 2 (two)  times daily.    . insulin lispro (HUMALOG) 100 UNIT/ML injection Inject 5 Units into the skin daily with lunch.     . Lactobacillus (ACIDOPHILUS PO) Take 1 capsule by mouth daily.    Marland Kitchen levothyroxine (SYNTHROID, LEVOTHROID) 137 MCG tablet Take 137 mcg by mouth daily before breakfast.     . losartan (COZAAR) 25 MG tablet Take 1 tablet (25 mg total) by mouth daily. 30 tablet 6  . metFORMIN (GLUCOPHAGE) 500 MG tablet Take 500-1,000 mg by mouth See admin instructions. Take 500 mg by mouth in the morning and take 1000 mg by mouth in the evening    . omeprazole (PRILOSEC) 40 MG capsule Take 40 mg by mouth daily.    . polyethylene glycol (MIRALAX / GLYCOLAX) packet Take 17 g by mouth daily. 30 each 0  . ticagrelor (BRILINTA) 90 MG TABS tablet Take 1 tablet (90 mg total) by mouth 2 (two) times daily. 60 tablet 3  . torsemide (DEMADEX) 20 MG tablet Take 2 tablets (40 mg total) by mouth 2 (two) times daily. 120 tablet 6  . Travoprost, BAK Free, (TRAVATAN) 0.004 % SOLN ophthalmic solution Place 1 drop into both eyes daily.    . carvedilol (COREG) 6.25 MG tablet Take 1 tablet (6.25 mg total) by mouth 2 (two) times daily. 60 tablet 3  . isosorbide mononitrate (IMDUR) 30 MG 24 hr tablet Take 1 tablet (30 mg total) by mouth daily. 30 tablet 3  . nitroGLYCERIN (NITROSTAT) 0.4 MG SL tablet Place 1 tablet (0.4 mg total) under the tongue every 5 (five) minutes as needed for chest pain. 25 tablet 0   No current facility-administered medications for this visit.     Allergies:   Sulfa antibiotics   Social History:  The patient  reports that she quit smoking about 35 years ago. Her smoking use included Cigarettes. She has a 22.00 pack-year smoking history. She has never used smokeless tobacco. She reports that she drinks alcohol. She reports that she does not use drugs.   Family History:  The patient's family history includes Diabetes in her sister; Heart disease in her brother and father; Multiple myeloma in her  mother; Other in her sister and sister.    ROS:  Please see the history of present illness.   Otherwise, review of systems is positive for Appetite change, leg swelling, palpitations, wheezing, constipation, nausea, back pain, joint swelling, balance problems, headaches.   All other systems are reviewed and negative.    PHYSICAL EXAM: VS:  BP 134/70   Pulse 97   Ht 5' 4"  (1.626 m)   Wt 211 lb 12.8 oz (96.1 kg)   SpO2 96%   BMI 36.36 kg/m  , BMI Body mass index is 36.36 kg/m.  GEN: Well nourished, well developed, in no acute distress  HEENT: normal  Neck: no JVD, carotid bruits, or masses Cardiac: RRR; no murmurs, rubs, or gallops,no edema  Respiratory:  clear to auscultation bilaterally, normal work of breathing GI: soft, nontender, nondistended, + BS MS: no deformity or atrophy  Skin: warm and dry,  device pocket is well healed Neuro:  Strength and sensation are intact Psych: euthymic mood, full affect  EKG:  EKG is ordered today. Personal review of the ekg ordered shows sinus rhythm, inferior MI, old  Chest pain occurring during the clinic visit. EKG without acute abnormalities. Sublingual nitroglycerin given which improved chest pain.   Device interrogation is reviewed today in detail.  See PaceArt for details.   Recent Labs: 02/03/2017: ALT 35; B Natriuretic Peptide 199.2 02/05/2017: Hemoglobin 9.1; Platelets 190 03/01/2017: BUN 27; Creatinine, Ser 1.36; Potassium 4.3; Sodium 140    Lipid Panel  No results found for: CHOL, TRIG, HDL, CHOLHDL, VLDL, LDLCALC, LDLDIRECT   Wt Readings from Last 3 Encounters:  03/05/17 211 lb 12.8 oz (96.1 kg)  03/01/17 213 lb 6 oz (96.8 kg)  02/18/17 208 lb (94.3 kg)      Other studies Reviewed: Additional studies/ records that were reviewed today include: TTE 02/04/17  Review of the above records today demonstrates:  - Left ventricle: The cavity size was normal. Wall thickness was   increased in a pattern of mild LVH. Systolic  function was mildly   reduced. The estimated ejection fraction was in the range of 45%   to 50%. Features are consistent with a pseudonormal left   ventricular filling pattern, with concomitant abnormal relaxation   and increased filling pressure (grade 2 diastolic dysfunction).   Doppler parameters are consistent with high ventricular filling   pressure. - Aortic valve: There was trivial regurgitation. - Mitral valve: Calcified annulus. Mildly thickened leaflets .   There was mild regurgitation. - Left atrium: The atrium was severely dilated. - Pulmonary arteries: PA peak pressure: 44 mm Hg (S).  Left heart cath 02/13/16 Left main normal LAD multiple stents in the mid to distal LAD. Mid LAD 80% stenosis distal LAD 99% stenosis, drug-eluting stent placed. Proximal LAD 30% Mid circumflex 50%, FFR 0.94 Proximal RCA 25%, mid RCA 25%, PDA 30%  ASSESSMENT AND PLAN:  1.  Chronic systolic heart failure due to ischemic cardiomyopathy: Ejection fraction 45-50%. Restrictive cardiomyopathy pattern. Is currently on optimal medical therapy. We'll plan to increase her carvedilol 6.25 mg.  2. 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 Continue aspirin and statin. She is having chest pain today in clinic. She was given nitroglycerin sublingual which improved her chest pain. EKG showed no major abnormalities. We'll plan to increase her carvedilol to 6.25 mg, start imdur 30 mg, and give her nitroglycerin to take as needed. We'll order a fasting lipid panel as well.  3. Hypertension: Currently well controlled. No changes at this time.  4. Sick sinus syndrome: Status post Medtronic dual chamber pacemaker. Device functioning appropriately. No changes.  5. Obstructive sleep apnea: Plan for split night sleep study.  Current medicines are reviewed at length with the patient today.   The patient does not have concerns regarding her medicines.  The following  changes were made today:  Increase coreg  Labs/ tests ordered today include:  Orders Placed This Encounter  Procedures  . Lipid panel     Disposition:   FU with Kavi Almquist 3 months  Signed, Mykeal Carrick Meredith Leeds, MD  03/05/2017 12:15 PM     [place] [address] [phone] (office) [phone] (fax)

## 2017-03-06 ENCOUNTER — Ambulatory Visit (HOSPITAL_COMMUNITY)
Admission: RE | Admit: 2017-03-06 | Discharge: 2017-03-06 | Disposition: A | Discharge status: Home / Self Care | Payer: Medicare Other | Source: Ambulatory Visit | Attending: Internal Medicine | Admitting: Internal Medicine

## 2017-03-06 ENCOUNTER — Other Ambulatory Visit: Payer: Medicare Other

## 2017-03-06 DIAGNOSIS — I5041 Acute combined systolic (congestive) and diastolic (congestive) heart failure: Secondary | ICD-10-CM | POA: Diagnosis not present

## 2017-03-06 LAB — LIPID PANEL
CHOL/HDL RATIO: 2.3 ratio
CHOLESTEROL: 87 mg/dL (ref 0–200)
HDL: 38 mg/dL — ABNORMAL LOW (ref >40)
LDL Cholesterol: 24 mg/dL (ref 0–99)
Triglycerides: 123 mg/dL (ref <150)
VLDL: 25 mg/dL (ref 0–40)

## 2017-03-11 NOTE — Addendum Note (Signed)
Addended by: Stanton Kidney on: 03/11/2017 01:06 PM   Modules accepted: Orders

## 2017-03-13 ENCOUNTER — Ambulatory Visit (HOSPITAL_COMMUNITY): Payer: Medicare Other

## 2017-03-20 ENCOUNTER — Ambulatory Visit (HOSPITAL_COMMUNITY)
Admission: RE | Admit: 2017-03-20 | Discharge: 2017-03-20 | Disposition: A | Discharge status: Home / Self Care | Payer: Medicare Other | Source: Ambulatory Visit | Attending: Cardiology | Admitting: Cardiology

## 2017-03-20 VITALS — BP 142/82 | HR 74 | Wt 210.0 lb

## 2017-03-20 DIAGNOSIS — G4733 Obstructive sleep apnea (adult) (pediatric): Secondary | ICD-10-CM | POA: Insufficient documentation

## 2017-03-20 DIAGNOSIS — E039 Hypothyroidism, unspecified: Secondary | ICD-10-CM | POA: Insufficient documentation

## 2017-03-20 DIAGNOSIS — I5022 Chronic systolic (congestive) heart failure: Secondary | ICD-10-CM | POA: Insufficient documentation

## 2017-03-20 DIAGNOSIS — I255 Ischemic cardiomyopathy: Secondary | ICD-10-CM | POA: Diagnosis not present

## 2017-03-20 DIAGNOSIS — I2510 Atherosclerotic heart disease of native coronary artery without angina pectoris: Secondary | ICD-10-CM | POA: Insufficient documentation

## 2017-03-20 DIAGNOSIS — I110 Hypertensive heart disease with heart failure: Secondary | ICD-10-CM | POA: Diagnosis not present

## 2017-03-20 DIAGNOSIS — E119 Type 2 diabetes mellitus without complications: Secondary | ICD-10-CM | POA: Insufficient documentation

## 2017-03-20 DIAGNOSIS — I10 Essential (primary) hypertension: Secondary | ICD-10-CM

## 2017-03-20 MED ORDER — LOSARTAN POTASSIUM 50 MG PO TABS: 50.0000 mg | ORAL_TABLET | Freq: Every day | ORAL | 5 refills | Status: DC

## 2017-03-20 NOTE — Patient Instructions (Addendum)
It was great to see you today!  Please INCREASE your losartan to 50 mg DAILY.   Please keep your appointment for blood work on 03/29/17.   Please keep your appointment with Oda Kilts, PA-C on 04/12/17.

## 2017-03-20 NOTE — Progress Notes (Signed)
HF MD: BENSIMHON  HPI:  Danielle [name] is a 71 year old African American female with a past medical history of systolic CHF (EF 13-24%), CAD, ischemic cardiomyopathy, HTN, DM, hypothyroidism, and OSA.   Admitted 02/03/17-02/06/17 with SOB, acute on chronic systolic CHF. Diuresed with IV lasix (12 pounds), discharge weight was 215 pounds.   Presents today for pharmacist-led HF/BP medication titration. At her last HF clinic visit on 03/01/17, her torsemide was increased to 40 mg BID but after a few days she reduced her dose back to 20 mg BID because she felt like she wasn't urinating any more than normal. She is now logging her daily weights which have been mostly stable between 205-211 lb. Does not feel SOB with walking into clinic or walking throughout her house. She states she now does not have fried food cravings and has been eating more baked chicken and vegetables. Scattered thought process. She attributes her recent BP increase to stressful situations in her life recently including the fact that her brother has been sick and may require an amputation. She also expressed concerns with her DMII management. She states that she has been having some low BG readings recently (60 this am) and is not able to see her PCP, Dr. Delfina Redwood, until November 30th.      . Shortness of breath/dyspnea on exertion? no  . Orthopnea/PND? no . Edema? Yes - stable has lymphedema . Lightheadedness/dizziness? no . Daily weights at home? Yes - 205-211 lb  . Blood pressure/heart rate monitoring at home? no . Following low-sodium/fluid-restricted diet? Yes - no salty foods, eating lots of fruit smoothies; baking chicken, vegetables  HF/BP Medications: Carvedilol 6.25 mg BID Isosorbide mononitrate 30 mg PO daily Torsemide 20 mg BID Losartan 25 mg daily   Has the patient been experiencing any side effects to the medications prescribed?  no  Does the patient have any problems obtaining medications due to transportation or  finances?   no  Understanding of regimen: fair Understanding of indications: fair Potential of compliance: good Patient understands to avoid NSAIDs. Patient understands to avoid decongestants.    Pertinent Lab Values: . 03/01/17: Serum creatinine 1.36 (BL ~1.1-1.3), BUN 27, Potassium 4.3, Sodium 140  Vital Signs: . Weight: 210 lb (dry weight: 208-210 lb) . Blood pressure: 142/82 mmHg  . Heart rate: 74 bpm     Assessment: 1. ChronicCHF (EF 45-50%), due to restrictive CM. NYHA class II-IIIsymptoms.  - Volume status stable  - Increase losartan to 50 mg daily for better BP control  - Continue carvedilol 6.25 mg BID, isosorbide mononitrate 30 mg daily, torsemide 20 mg BID - Basic disease state pathophysiology, medication indication, mechanism and side effects reviewed at length with patient and he verbalized understanding 2. CAD: Stenting in 2010, 2012 and 2016. Records pending from [place].  - Denies chest pain   - Continue ASA and statin 3. HTN  - Elevated today above goal <130/90 mmHg, see above discussion  4. DM  - Follows with Dr. Delfina Redwood but next appt not until November 30th  - Will send message to [place], [place], to see if she can get in anywhere sooner  5. PPM  - Says this was placed due to "slow heart rate"  - Established with Dr. Curt Bears  6. OSA  - Non compliant with CPAP  - Saw Dr. Elsworth Soho, plan for split night study   Plan: 1) Medication changes: Based on clinical presentation, vital signs and recent labs will increase losartan to 50 mg daily  2) Labs: BMET in 1 week  3) Follow-up: APP clinic on 04/12/17    Hulet Ehrmann K. Velva Harman, PharmD, BCPS, CPP Clinical Pharmacist Pager: [phone] Phone: [phone] 03/20/2017 11:56 AM

## 2017-03-29 ENCOUNTER — Ambulatory Visit (HOSPITAL_COMMUNITY)
Admission: RE | Admit: 2017-03-29 | Discharge: 2017-03-29 | Disposition: A | Discharge status: Home / Self Care | Payer: Medicare Other | Source: Ambulatory Visit | Attending: Internal Medicine | Admitting: Internal Medicine

## 2017-03-29 DIAGNOSIS — I10 Essential (primary) hypertension: Secondary | ICD-10-CM | POA: Diagnosis not present

## 2017-03-29 LAB — BASIC METABOLIC PANEL
ANION GAP: 10 (ref 5–15)
BUN: 22 mg/dL — AB (ref 6–20)
CO2: 28 mmol/L (ref 22–32)
CREATININE: 1.1 mg/dL — AB (ref 0.44–1.00)
Calcium: 9.2 mg/dL (ref 8.9–10.3)
Chloride: 106 mmol/L (ref 101–111)
GFR, EST AFRICAN AMERICAN: 58 mL/min — AB (ref >60)
GFR, EST NON AFRICAN AMERICAN: 50 mL/min — AB (ref >60)
GLUCOSE: 127 mg/dL — AB (ref 65–99)
Potassium: 3.9 mmol/L (ref 3.5–5.1)
Sodium: 144 mmol/L (ref 135–145)

## 2017-04-01 ENCOUNTER — Ambulatory Visit (HOSPITAL_BASED_OUTPATIENT_CLINIC_OR_DEPARTMENT_OTHER): Payer: Medicare Other | Attending: Cardiology | Admitting: Cardiology

## 2017-04-01 DIAGNOSIS — G4731 Primary central sleep apnea: Secondary | ICD-10-CM | POA: Insufficient documentation

## 2017-04-01 DIAGNOSIS — R0683 Snoring: Secondary | ICD-10-CM | POA: Diagnosis not present

## 2017-04-01 DIAGNOSIS — G4733 Obstructive sleep apnea (adult) (pediatric): Secondary | ICD-10-CM | POA: Diagnosis not present

## 2017-04-01 DIAGNOSIS — I5042 Chronic combined systolic (congestive) and diastolic (congestive) heart failure: Secondary | ICD-10-CM | POA: Diagnosis not present

## 2017-04-10 NOTE — Procedures (Signed)
   Patient Name: Danielle [name], Danielle [name] Date: 04/01/2017 Gender: Female D.O.B: 1946-01-17 Age (years): 27 Referring Provider: Jettie Booze NP Height (inches): 64 Interpreting Physician: Fransico Him MD, ABSM Weight (lbs): 208 RPSGT: Carolin Coy BMI: 36 MRN: [id number] Neck Size: 17.00  CLINICAL INFORMATION Sleep Study Type: NPSG  Indication for sleep study: Daytime Fatigue, Diabetes, Fatigue, Hypertension, Obesity, Snoring  Epworth Sleepiness Score: 10  SLEEP STUDY TECHNIQUE As per the AASM Manual for the Scoring of Sleep and Associated Events v2.3 (April 2016) with a hypopnea requiring 4% desaturations.  The channels recorded and monitored were frontal, central and occipital EEG, electrooculogram (EOG), submentalis EMG (chin), nasal and oral airflow, thoracic and abdominal wall motion, anterior tibialis EMG, snore microphone, electrocardiogram, and pulse oximetry.  MEDICATIONS Medications self-administered by patient taken the night of the study : LIPITOR, LANTUS, METFORMIN, BRILINTA, [place] The study was initiated at 10:44:04 PM and ended at 4:50:45 AM.  Sleep onset time was 1.4 minutes and the sleep efficiency was 87.4%. The total sleep time was 320.5 minutes.  Stage REM latency was 88.0 minutes.  The patient spent 17.63% of the night in stage N1 sleep, 70.51% in stage N2 sleep, 0.62% in stage N3 and 11.23% in REM.  Alpha intrusion was absent.  Supine sleep was 29.95%.  RESPIRATORY PARAMETERS The overall apnea/hypopnea index (AHI) was 75.6 per hour. There were 174 total apneas, including 129 obstructive, 41 central and 4 mixed apneas. There were 230 hypopneas and 5 RERAs.  The AHI during Stage REM sleep was 35.0 per hour.  AHI while supine was 85.0 per hour.  The mean oxygen saturation was 92.90%. The minimum SpO2 during sleep was 82.00%.  moderate snoring was noted during this study.  CARDIAC DATA The 2 lead EKG demonstrated  sinus rhythm. The mean heart rate was 72.89 beats per minute. Other EKG findings include: PVCs.  LEG MOVEMENT DATA The total PLMS were 0 with a resulting PLMS index of 0.00. Associated arousal with leg movement index was 0.0 .  IMPRESSIONS - Severe obstructive sleep apnea occurred during this study (AHI = 75.6/h). - Mild central sleep apnea occurred during this study (CAI = 7.7/h). - Moderate oxygen desaturation was noted during this study (Min O2 = 82.00%). - The patient snored with moderate snoring volume. - EKG findings include PVCs and PACs. - Clinically significant periodic limb movements did not occur during sleep. No significant associated arousals.  DIAGNOSIS - Obstructive Sleep Apnea (327.23 [G47.33 ICD-10]) - Central Sleep Apnea (327.27 [G47.37 ICD-10]) - Nocturnal Hypoxemia (327.26 [G47.36 ICD-10])  RECOMMENDATIONS - CPAP titration to determine optimal pressure required to alleviate sleep disordered breathing. BiPAP or ASV titration may be required to eliminate central sleep apnea. - Positional therapy avoiding supine position during sleep. - Avoid alcohol, sedatives and other CNS depressants that may worsen sleep apnea and disrupt normal sleep architecture. - Sleep hygiene should be reviewed to assess factors that may improve sleep quality. - Weight management and regular exercise should be initiated or continued if appropriate.  [place], American Board of Sleep Medicine  ELECTRONICALLY SIGNED ON:  04/10/2017, 4:20 PM [place] PH: [phone]   FX: [phone] [place]

## 2017-04-12 ENCOUNTER — Ambulatory Visit (HOSPITAL_COMMUNITY)
Admission: RE | Admit: 2017-04-12 | Discharge: 2017-04-12 | Disposition: A | Discharge status: Home / Self Care | Payer: Medicare Other | Source: Ambulatory Visit | Attending: Cardiology | Admitting: Cardiology

## 2017-04-12 ENCOUNTER — Encounter (HOSPITAL_COMMUNITY): Payer: Self-pay

## 2017-04-12 VITALS — BP 118/62 | HR 84 | Wt 212.0 lb

## 2017-04-12 DIAGNOSIS — Z950 Presence of cardiac pacemaker: Secondary | ICD-10-CM | POA: Diagnosis not present

## 2017-04-12 DIAGNOSIS — E119 Type 2 diabetes mellitus without complications: Secondary | ICD-10-CM | POA: Insufficient documentation

## 2017-04-12 DIAGNOSIS — I25118 Atherosclerotic heart disease of native coronary artery with other forms of angina pectoris: Secondary | ICD-10-CM

## 2017-04-12 DIAGNOSIS — M1990 Unspecified osteoarthritis, unspecified site: Secondary | ICD-10-CM | POA: Insufficient documentation

## 2017-04-12 DIAGNOSIS — Z87891 Personal history of nicotine dependence: Secondary | ICD-10-CM | POA: Diagnosis not present

## 2017-04-12 DIAGNOSIS — Z7982 Long term (current) use of aspirin: Secondary | ICD-10-CM | POA: Insufficient documentation

## 2017-04-12 DIAGNOSIS — I5042 Chronic combined systolic (congestive) and diastolic (congestive) heart failure: Secondary | ICD-10-CM | POA: Diagnosis not present

## 2017-04-12 DIAGNOSIS — E039 Hypothyroidism, unspecified: Secondary | ICD-10-CM | POA: Diagnosis not present

## 2017-04-12 DIAGNOSIS — Z853 Personal history of malignant neoplasm of breast: Secondary | ICD-10-CM | POA: Insufficient documentation

## 2017-04-12 DIAGNOSIS — E118 Type 2 diabetes mellitus with unspecified complications: Secondary | ICD-10-CM

## 2017-04-12 DIAGNOSIS — I255 Ischemic cardiomyopathy: Secondary | ICD-10-CM | POA: Insufficient documentation

## 2017-04-12 DIAGNOSIS — Z794 Long term (current) use of insulin: Secondary | ICD-10-CM | POA: Insufficient documentation

## 2017-04-12 DIAGNOSIS — Z79899 Other long term (current) drug therapy: Secondary | ICD-10-CM | POA: Insufficient documentation

## 2017-04-12 DIAGNOSIS — I2510 Atherosclerotic heart disease of native coronary artery without angina pectoris: Secondary | ICD-10-CM | POA: Insufficient documentation

## 2017-04-12 DIAGNOSIS — G4733 Obstructive sleep apnea (adult) (pediatric): Secondary | ICD-10-CM

## 2017-04-12 DIAGNOSIS — I10 Essential (primary) hypertension: Secondary | ICD-10-CM | POA: Diagnosis not present

## 2017-04-12 DIAGNOSIS — M109 Gout, unspecified: Secondary | ICD-10-CM | POA: Diagnosis not present

## 2017-04-12 DIAGNOSIS — I110 Hypertensive heart disease with heart failure: Secondary | ICD-10-CM | POA: Diagnosis not present

## 2017-04-12 MED ORDER — PREDNISONE 20 MG PO TABS: ORAL_TABLET | ORAL | 0 refills | Status: DC

## 2017-04-12 MED ORDER — TORSEMIDE 20 MG PO TABS: 40.0000 mg | ORAL_TABLET | Freq: Two times a day (BID) | ORAL | 6 refills | Status: DC

## 2017-04-12 MED ORDER — LEVOTHYROXINE SODIUM 137 MCG PO TABS: 137.0000 ug | ORAL_TABLET | Freq: Every day | ORAL | 0 refills | Status: DC

## 2017-04-12 NOTE — Progress Notes (Signed)
Advanced Heart Failure Clinic Note    Primary Care: Dr. Delfina Redwood Primary Cardiologist: Dr. Haroldine Laws   HPI: Danielle [name] is a 71 year old female with a past medical history of systolic CHF (EF 74-82%), CAD, ischemic cardiomyopathy, HTN, DM, hypothyroidism, and OSA.   Admitted 02/03/17-02/06/17 with SOB, acute on chronic systolic CHF. Diuresed with IV lasix (12 pounds), discharge weight was 215 pounds.   Established with Dr. Curt Bears last month for [facility] management.   She presents today for follow up.  Seen at Pharmacy clinic last month. Losartan increased.  Her breathing is stable walking into clinic and with ADLs. Her primary c/o is joint pain and gout. She missed her IM appointment and they didn't have anything available for several weeks. She states, angrily, that she is watching her salt. She is only taking torsemide 20 mg BID instead of 40 mg as ordered. She continues to have scattered thought processes and returns to her joint pain regardless of topic of conversation.   Review of systems complete and found to be negative unless listed in HPI.    Past Medical History:  Diagnosis Date  . Arthritis   . Blood dyscrasia    per pt-has small blood cells-appears as if anemic  . Cancer (HCC)    breast  . CHF (congestive heart failure) ([place])   . Complication of anesthesia    difficult to awaken from per pt  . Diabetes mellitus without complication ([place])   . Dyspnea   . Essential hypertension   . Heart disease   . Hypothyroidism   . Presence of permanent cardiac pacemaker   . Sleep apnea   . Thyroid disease    hypothyroid    Current Outpatient Medications  Medication Sig Dispense Refill  . acetaminophen (TYLENOL) 650 MG CR tablet Take 650 mg by mouth every 8 (eight) hours as needed for pain.    . Ascorbic Acid (VITAMIN C PO) Take 1 tablet by mouth daily.    Marland Kitchen aspirin 81 MG tablet Take 1 tablet (81 mg total) by mouth daily. 30 tablet 3  . atorvastatin (LIPITOR) 80 MG tablet Take 1 tablet  (80 mg total) by mouth at bedtime. 30 tablet 3  . B Complex-C (SUPER B COMPLEX PO) Take 2 tablets by mouth daily.    . Biotin 10 MG CAPS Take 10 mg by mouth daily.    . calcium citrate (CALCITRATE - DOSED IN MG ELEMENTAL CALCIUM) 950 MG tablet Take 1 tablet by mouth 2 (two) times daily.     . carvedilol (COREG) 6.25 MG tablet Take 1 tablet (6.25 mg total) by mouth 2 (two) times daily. 60 tablet 3  . CHONDROITIN SULFATE PO Take 250 mg by mouth daily.    . colchicine 0.6 MG tablet Take 0.6 mg by mouth 2 (two) times daily.    . ferrous sulfate 325 (65 FE) MG tablet Take 325 mg by mouth 2 (two) times daily with a meal.    . folic acid (FOLVITE) 1 MG tablet Take 1 mg by mouth daily.    . insulin glargine (LANTUS) 100 UNIT/ML injection Inject 30 Units into the skin 2 (two) times daily.     . insulin lispro (HUMALOG) 100 UNIT/ML injection Inject 5 Units into the skin daily with lunch.     . isosorbide mononitrate (IMDUR) 30 MG 24 hr tablet Take 1 tablet (30 mg total) by mouth daily. 30 tablet 3  . Lactobacillus (ACIDOPHILUS PO) Take 1 capsule by mouth daily.    Marland Kitchen  levothyroxine (SYNTHROID, LEVOTHROID) 137 MCG tablet Take 137 mcg by mouth daily before breakfast.     . losartan (COZAAR) 50 MG tablet Take 1 tablet (50 mg total) by mouth daily. 30 tablet 5  . metFORMIN (GLUCOPHAGE) 500 MG tablet Take 500 mg by mouth 2 (two) times daily with a meal.     . nitroGLYCERIN (NITROSTAT) 0.4 MG SL tablet Place 1 tablet (0.4 mg total) under the tongue every 5 (five) minutes as needed for chest pain. 25 tablet 0  . omeprazole (PRILOSEC) 40 MG capsule Take 40 mg by mouth daily.    . ticagrelor (BRILINTA) 90 MG TABS tablet Take 1 tablet (90 mg total) by mouth 2 (two) times daily. 60 tablet 3  . torsemide (DEMADEX) 20 MG tablet Take 20 mg by mouth 2 (two) times daily.    . Travoprost, BAK Free, (TRAVATAN) 0.004 % SOLN ophthalmic solution Place 1 drop into both eyes daily.     No current facility-administered  medications for this encounter.    Allergies  Allergen Reactions  . Sulfa Antibiotics Rash   Social History   Socioeconomic History  . Marital status: Married    Spouse name: Not on file  . Number of children: Not on file  . Years of education: Not on file  . Highest education level: Not on file  Social Needs  . Financial resource strain: Not on file  . Food insecurity - worry: Not on file  . Food insecurity - inability: Not on file  . Transportation needs - medical: Not on file  . Transportation needs - non-medical: Not on file  Occupational History  . Not on file  Tobacco Use  . Smoking status: Former Smoker    Packs/day: 1.00    Years: 22.00    Pack years: 22.00    Types: Cigarettes    Last attempt to quit: 1983    Years since quitting: 35.8  . Smokeless tobacco: Never Used  Substance and Sexual Activity  . Alcohol use: Yes    Comment: ocassional  . Drug use: No  . Sexual activity: No    Birth control/protection: None, Post-menopausal  Other Topics Concern  . Not on file  Social History Narrative  . Not on file   Family History  Problem Relation Age of Onset  . Multiple myeloma Mother   . Heart disease Father   . Other Sister   . Heart disease Brother   . Diabetes Sister   . Other Sister    Vitals:   04/12/17 1117  BP: 118/62  Pulse: 84  SpO2: 98%  Weight: 212 lb (96.2 kg)   Wt Readings from Last 3 Encounters:  04/12/17 212 lb (96.2 kg)  04/01/17 208 lb (94.3 kg)  03/20/17 210 lb (95.3 kg)    PHYSICAL EXAM: General: Elderly No resp difficulty. HEENT: Normal Neck: Supple. JVP 9-10 cm. Carotids 2+ bilat; no bruits. No thyromegaly or nodule noted. Cor: PMI nondisplaced. RRR, No M/G/R noted Lungs: CTAB, normal effort. Abdomen: Soft, non-tender, non-distended, no HSM. No bruits or masses. +BS  Extremities: No cyanosis, clubbing, or rash. 1-2+ edema to knees.  Neuro: Alert & orientedx3, cranial nerves grossly intact. moves all 4 extremities w/o  difficulty. Affect pleasant   ASSESSMENT & PLAN:  1. Chronic systolic CHF: ICM, Echo 12/11/94 EF 45-50%, LA severely dilated, restrictive CM pattern.  - NYHA II-III symptoms, confounded by her arthritis and gout pain.  - Volume status elevated on exam.  - Increase torsemide to  40 mg BID as previously ordered. BMET 10-14 days with change.   - BMET today. Repeat 2 weeks with med cahnge.  - Continue imdur 30 mg daily - Continue losartan 50 mg daily. BP much improved.  - Continue losartan 50 mg daily.  - No Spiro with hyperkalemia - Continue Coreg 3.125 mg BID. She is resistant to increasing more than 1 med at any time. Consider increase next visit.   2. CAD: Stenting in 2010, 2012 and 2016. Records pending from [place]. - No s/s of ischemia.    - Continue ASA and statin.   3. HTN - Much improved on increased meds as above.   4. DM - Follows with Dr. Delfina Redwood. Sees in several weeks.   5. PPM - Dr. Curt Bears now following.   6. OSA - Sleep study completed this week. Awaiting results.   7. Gout - With acute flare in her hands and ankles.  - Continue colchicine - Will give 3 days of prednisone 40 mg for gout flare.  Pt knows this can affect her sugars and she may need to give herself extra insulin.   8. Arthritis - Continue tylenol. Further per PCP.   Meds and labs as above. Follow up 4 weeks to re-assess with med changes.   Shirley Friar, PA-C 04/12/17   Greater than 50% of the 25 minute visit was spent in counseling/coordination of care regarding disease state education, salt/fluid restriction, sliding scale diuretics, and medication compliance.

## 2017-04-12 NOTE — Patient Instructions (Signed)
INCREASE Torsemide to 40 mg (2 tabs) twice daily.  START Prednisone 40 mg (2 tabs) once daily for three days.  Will refer you to Cardiac Rehab at [facility]. They will call you to set up initial appointment.  Return in 2 weeks for lab work.  _______________________________________________________ Danielle [name] Code: 8001  Follow up 4 weeks.  ________________________________________________________ Danielle [name] Code: 4098  Take all medication as prescribed the day of your appointment. Bring all medications with you to your appointment.  Do the following things EVERYDAY: 1) Weigh yourself in the morning before breakfast. Write it down and keep it in a log. 2) Take your medicines as prescribed 3) Eat low salt foods-Limit salt (sodium) to 2000 mg per day.  4) Stay as active as you can everyday 5) Limit all fluids for the day to less than 2 liters

## 2017-04-12 NOTE — Addendum Note (Signed)
Encounter addended by: Effie Berkshire, RN on: 04/12/2017 1:45 PM  Actions taken: Order list changed

## 2017-04-17 ENCOUNTER — Telehealth: Payer: Self-pay | Admitting: *Deleted

## 2017-04-17 DIAGNOSIS — G4733 Obstructive sleep apnea (adult) (pediatric): Secondary | ICD-10-CM

## 2017-04-17 NOTE — Telephone Encounter (Signed)
-----   Message from Sueanne Margarita, MD sent at 04/10/2017  4:21 PM EST ----- Please let patient know that they have sleep apnea and recommend CPAP titration. Please set up titration in the sleep lab.

## 2017-04-17 NOTE — Progress Notes (Signed)
No mam, Now I understand the confusion.  We will let her know that pulmonary rehab is appropriate for her.   Jinny Blossom would you mind contacting her and resending a referral to pulmonary rehab.   She doesn't qualify for CR with her EF, but should for pulmonary with her DOE.   Thanks!   Legrand Como [address]" [place], PA-C 04/17/2017 4:40 PM

## 2017-04-17 NOTE — Telephone Encounter (Addendum)
Informed patient of sleep study results and patient understanding was verbalized. Patient understands she has sleep apnea and Dr Radford Pax has recommended her for a CPAP Titration. Patient understands the CPAP Titration is to treat her sleep apnea. Patient understands the CPAP Titration will be done in the sleep lab. Patient was grateful for the call and thanked me.

## 2017-04-22 ENCOUNTER — Other Ambulatory Visit (HOSPITAL_COMMUNITY): Payer: Self-pay

## 2017-04-22 ENCOUNTER — Telehealth (HOSPITAL_COMMUNITY): Payer: Self-pay

## 2017-04-22 DIAGNOSIS — I5022 Chronic systolic (congestive) heart failure: Secondary | ICD-10-CM

## 2017-04-22 NOTE — Telephone Encounter (Signed)
New referral was placed for pulmonary rehab - Closing referral.

## 2017-04-22 NOTE — Telephone Encounter (Signed)
Patients EF is too high to participate in Cardiac Rehab - Patient is eligible to participate in Pulmonary Rehab. Called Dr.McLeans office and left message on the Triage voicemail to get new order

## 2017-04-23 ENCOUNTER — Telehealth (HOSPITAL_COMMUNITY): Payer: Self-pay

## 2017-04-23 NOTE — Telephone Encounter (Signed)
Patients insurance is active and benefits verified through Medicare Part A & B - Patient is responsible for 20%.  Patients insurance is active and benefits verified through [employer] covers the 20% that Medicare does not. Patient is covered at 100%  Patient will be contacted and scheduled.

## 2017-04-23 NOTE — Telephone Encounter (Signed)
Called and spoke with patient in regards to Pulmonary Rehab - Scheduled orientation on 05/13/2017 at 9:30am. Patient will attend the 10:30am exc class.

## 2017-04-24 ENCOUNTER — Encounter: Payer: Self-pay | Admitting: Cardiology

## 2017-04-26 DIAGNOSIS — I2510 Atherosclerotic heart disease of native coronary artery without angina pectoris: Secondary | ICD-10-CM | POA: Diagnosis not present

## 2017-04-26 DIAGNOSIS — E1140 Type 2 diabetes mellitus with diabetic neuropathy, unspecified: Secondary | ICD-10-CM | POA: Diagnosis not present

## 2017-04-26 DIAGNOSIS — E7800 Pure hypercholesterolemia, unspecified: Secondary | ICD-10-CM | POA: Diagnosis not present

## 2017-04-26 DIAGNOSIS — I10 Essential (primary) hypertension: Secondary | ICD-10-CM | POA: Diagnosis not present

## 2017-04-26 DIAGNOSIS — I5020 Unspecified systolic (congestive) heart failure: Secondary | ICD-10-CM | POA: Diagnosis not present

## 2017-04-26 DIAGNOSIS — E039 Hypothyroidism, unspecified: Secondary | ICD-10-CM | POA: Diagnosis not present

## 2017-04-26 DIAGNOSIS — M109 Gout, unspecified: Secondary | ICD-10-CM | POA: Diagnosis not present

## 2017-04-26 DIAGNOSIS — Z794 Long term (current) use of insulin: Secondary | ICD-10-CM | POA: Diagnosis not present

## 2017-04-26 DIAGNOSIS — G4730 Sleep apnea, unspecified: Secondary | ICD-10-CM | POA: Diagnosis not present

## 2017-04-26 DIAGNOSIS — D649 Anemia, unspecified: Secondary | ICD-10-CM | POA: Diagnosis not present

## 2017-04-30 ENCOUNTER — Ambulatory Visit (INDEPENDENT_AMBULATORY_CARE_PROVIDER_SITE_OTHER): Payer: Medicare Other | Admitting: *Deleted

## 2017-04-30 DIAGNOSIS — I495 Sick sinus syndrome: Secondary | ICD-10-CM

## 2017-05-01 ENCOUNTER — Encounter: Payer: Self-pay | Admitting: Cardiology

## 2017-05-01 LAB — CUP PACEART REMOTE DEVICE CHECK
Battery Remaining Longevity: 95 mo
Battery Voltage: 3.02 V
Brady Statistic AP VS Percent: 0.12 %
Brady Statistic AS VS Percent: 73.7 %
Date Time Interrogation Session: 20181204205933
Implantable Lead Implant Date: 20160222
Implantable Lead Implant Date: 20160222
Implantable Lead Location: 753859
Implantable Pulse Generator Implant Date: 20160222
Lead Channel Pacing Threshold Amplitude: 0.75 V
Lead Channel Pacing Threshold Amplitude: 0.75 V
Lead Channel Pacing Threshold Pulse Width: 0.4 ms
Lead Channel Sensing Intrinsic Amplitude: 21.625 mV
Lead Channel Sensing Intrinsic Amplitude: 4 mV
Lead Channel Sensing Intrinsic Amplitude: 4 mV
Lead Channel Setting Pacing Amplitude: 2 V
Lead Channel Setting Pacing Amplitude: 2 V
Lead Channel Setting Sensing Sensitivity: 2 mV
MDC IDC LEAD LOCATION: 753860
MDC IDC MSMT LEADCHNL RA IMPEDANCE VALUE: 399 Ohm
MDC IDC MSMT LEADCHNL RA IMPEDANCE VALUE: 418 Ohm
MDC IDC MSMT LEADCHNL RA PACING THRESHOLD PULSEWIDTH: 0.4 ms
MDC IDC MSMT LEADCHNL RV IMPEDANCE VALUE: 437 Ohm
MDC IDC MSMT LEADCHNL RV IMPEDANCE VALUE: 532 Ohm
MDC IDC MSMT LEADCHNL RV SENSING INTR AMPL: 21.625 mV
MDC IDC SET LEADCHNL RV PACING PULSEWIDTH: 0.4 ms
MDC IDC STAT BRADY AP VP PERCENT: 0.77 %
MDC IDC STAT BRADY AS VP PERCENT: 25.41 %
MDC IDC STAT BRADY RA PERCENT PACED: 0.89 %
MDC IDC STAT BRADY RV PERCENT PACED: 26.04 %

## 2017-05-01 NOTE — Progress Notes (Signed)
Remote pacemaker transmission.   

## 2017-05-10 ENCOUNTER — Encounter (HOSPITAL_COMMUNITY): Payer: Medicare Other

## 2017-05-12 NOTE — Progress Notes (Signed)
Advanced Heart Failure Clinic Note    Primary Care: Dr. Delfina Redwood Primary Cardiologist: Dr. Haroldine Laws   HPI: Danielle [name] is a 71 year old female with a past medical history of systolic CHF (EF 08-65%), CAD, ischemic cardiomyopathy, HTN, DM, hypothyroidism, and OSA sleep study 03/2017.   Admitted 02/03/17-02/06/17 with SOB, acute on chronic systolic CHF. Diuresed with IV lasix (12 pounds), discharge weight was 215 pounds.   Established with Dr. Curt Bears last month for [facility] management.   Today she returns for HF follow up. Last visit torsemide was increased to 40 mg twice a day however she has been taking 20 mg in am and 40 mg in pm. Overall complaining of ear pain, sore throat, and sinus drainage. SOB with exertion. SOB with steps. + Bendopnea. + orthopnea sleeps on 2 pillows. Appetite ok. No fever or chills. Weight at home 207-210 pounds.  Taking all medications. CPAP start pending.   ECHO 01/2017- EF 45-50% Grade IIDD [facility] Severely dilated.    Review of systems complete and found to be negative unless listed in HPI.    Past Medical History:  Diagnosis Date  . Arthritis   . Blood dyscrasia    per pt-has small blood cells-appears as if anemic  . Cancer (HCC)    breast  . CHF (congestive heart failure) ([place])   . Complication of anesthesia    difficult to awaken from per pt  . Diabetes mellitus without complication ([place])   . Dyspnea   . Essential hypertension   . Heart disease   . Hypothyroidism   . Presence of permanent cardiac pacemaker   . Sleep apnea   . Thyroid disease    hypothyroid    Current Outpatient Medications  Medication Sig Dispense Refill  . acetaminophen (TYLENOL) 650 MG CR tablet Take 650 mg by mouth every 8 (eight) hours as needed for pain.    . Ascorbic Acid (VITAMIN C PO) Take 1 tablet by mouth daily.    Marland Kitchen aspirin 81 MG tablet Take 1 tablet (81 mg total) by mouth daily. 30 tablet 3  . atorvastatin (LIPITOR) 80 MG tablet Take 1 tablet (80 mg total) by mouth  at bedtime. 30 tablet 3  . B Complex-C (SUPER B COMPLEX PO) Take 2 tablets by mouth daily.    . Biotin 10 MG CAPS Take 10 mg by mouth daily.    . calcium citrate (CALCITRATE - DOSED IN MG ELEMENTAL CALCIUM) 950 MG tablet Take 1 tablet by mouth 2 (two) times daily.     . carvedilol (COREG) 6.25 MG tablet Take 1 tablet (6.25 mg total) by mouth 2 (two) times daily. 60 tablet 3  . CHONDROITIN SULFATE PO Take 250 mg by mouth daily.    . colchicine 0.6 MG tablet Take 0.6 mg by mouth 2 (two) times daily.    . ferrous sulfate 325 (65 FE) MG tablet Take 325 mg by mouth 2 (two) times daily with a meal.    . folic acid (FOLVITE) 1 MG tablet Take 1 mg by mouth daily.    . insulin glargine (LANTUS) 100 UNIT/ML injection Inject 30 Units into the skin 2 (two) times daily.     . insulin lispro (HUMALOG) 100 UNIT/ML injection Inject 5 Units into the skin daily with lunch.     . isosorbide mononitrate (IMDUR) 30 MG 24 hr tablet Take 1 tablet (30 mg total) by mouth daily. 30 tablet 3  . Lactobacillus (ACIDOPHILUS PO) Take 1 capsule by mouth daily.    Marland Kitchen  levothyroxine (SYNTHROID, LEVOTHROID) 137 MCG tablet Take 1 tablet (137 mcg total) daily before breakfast by mouth. Further refills must come from primary care doctor. 30 tablet 0  . losartan (COZAAR) 50 MG tablet Take 1 tablet (50 mg total) by mouth daily. 30 tablet 5  . metFORMIN (GLUCOPHAGE) 500 MG tablet Take 500 mg by mouth 2 (two) times daily with a meal.     . nitroGLYCERIN (NITROSTAT) 0.4 MG SL tablet Place 1 tablet (0.4 mg total) under the tongue every 5 (five) minutes as needed for chest pain. 25 tablet 0  . omeprazole (PRILOSEC) 40 MG capsule Take 40 mg by mouth daily.    . predniSONE (DELTASONE) 20 MG tablet Take 40 mg (2 tabs) daily for three days (until bottle empty). 6 tablet 0  . ticagrelor (BRILINTA) 90 MG TABS tablet Take 1 tablet (90 mg total) by mouth 2 (two) times daily. 60 tablet 3  . torsemide (DEMADEX) 20 MG tablet Take 2 tablets (40 mg total)  2 (two) times daily by mouth. 120 tablet 6  . Travoprost, BAK Free, (TRAVATAN) 0.004 % SOLN ophthalmic solution Place 1 drop into both eyes daily.     No current facility-administered medications for this encounter.    Allergies  Allergen Reactions  . Sulfa Antibiotics Rash   Social History   Socioeconomic History  . Marital status: Married    Spouse name: Not on file  . Number of children: Not on file  . Years of education: Not on file  . Highest education level: Not on file  Social Needs  . Financial resource strain: Not on file  . Food insecurity - worry: Not on file  . Food insecurity - inability: Not on file  . Transportation needs - medical: Not on file  . Transportation needs - non-medical: Not on file  Occupational History  . Not on file  Tobacco Use  . Smoking status: Former Smoker    Packs/day: 1.00    Years: 22.00    Pack years: 22.00    Types: Cigarettes    Last attempt to quit: 1983    Years since quitting: 35.9  . Smokeless tobacco: Never Used  Substance and Sexual Activity  . Alcohol use: Yes    Comment: ocassional  . Drug use: No  . Sexual activity: No    Birth control/protection: None, Post-menopausal  Other Topics Concern  . Not on file  Social History Narrative  . Not on file   Family History  Problem Relation Age of Onset  . Multiple myeloma Mother   . Heart disease Father   . Other Sister   . Heart disease Brother   . Diabetes Sister   . Other Sister    Vitals:   05/13/17 0941  BP: 120/84  Pulse: 79  SpO2: 97%  Weight: 212 lb 3.2 oz (96.3 kg)   Wt Readings from Last 3 Encounters:  05/13/17 212 lb 3.2 oz (96.3 kg)  04/12/17 212 lb (96.2 kg)  04/01/17 208 lb (94.3 kg)    ReDS Vest - 05/13/17 1000      ReDS Vest   MR   No    Estimated volume prior to reading  Med    Fitting Posture  Sitting    Height Marker  Short    Ruler Value  8    Center Strip  Shifted    ReDS Value  33       PHYSICAL EXAM: General:  Well appearing.  No resp difficulty.  Walked in the clinic without difficulty HEENT: normal Neck: supple. no JVD. Carotids 2+ bilat; no bruits. No lymphadenopathy or thryomegaly appreciated. Cor: PMI nondisplaced. Regular rate & rhythm. No rubs, gallops or murmurs. Lungs: clear Abdomen: soft, nontender, nondistended. No hepatosplenomegaly. No bruits or masses. Good bowel sounds. Extremities: no cyanosis, clubbing, rash, edema Neuro: alert & orientedx3, cranial nerves grossly intact. moves all 4 extremities w/o difficulty. Affect pleasant  ASSESSMENT & PLAN:  1. Chronic systolic CHF: ICM, Echo 5/50/01 EF 45-50%, LA severely dilated, restrictive CM pattern.  - NYHA II-III. She is not volume overloaded. Vest reading 33%.  - Volume status stable.  - Continue torsemide 40 mg in am and 20 mg in pm.  - Continue imdur 30 mg daily - Continue losartan 50 mg daily. - No Spiro with hyperkalemia - Continue Coreg 3.125 mg BID.  - Discussed cardiomems and she is not interested.   2. CAD: Stenting in 2010, 2012 and 2016. Records pending from [place]. -No S/S ischemia - Continue ASA and statin.   3. HTN - Stable.   4. DM - Follows with Dr. Delfina Redwood. Sees in several weeks.   5. PPM - Dr. Curt Bears now following.   6. OSA -Had Sleep study. CPAP start pending.   7. Gout - Continue colchicine as needed.  -   8. Arthritis - Continue tylenol. Further per PCP.   Follow up in 2 months.  Greater than 50% of the (total minutes 25) visit spent in counseling/coordination of care regarding heart failure, cardiomems, low salt food choices.     Darrick Grinder, NP 05/13/17

## 2017-05-13 ENCOUNTER — Ambulatory Visit (HOSPITAL_COMMUNITY): Payer: Medicare Other

## 2017-05-13 ENCOUNTER — Encounter (HOSPITAL_COMMUNITY): Payer: Self-pay

## 2017-05-13 ENCOUNTER — Ambulatory Visit (HOSPITAL_COMMUNITY)
Admission: RE | Admit: 2017-05-13 | Discharge: 2017-05-13 | Disposition: A | Discharge status: Home / Self Care | Payer: Medicare Other | Source: Ambulatory Visit | Attending: Internal Medicine | Admitting: Internal Medicine

## 2017-05-13 ENCOUNTER — Telehealth (HOSPITAL_COMMUNITY): Payer: Self-pay | Admitting: Surgery

## 2017-05-13 VITALS — BP 120/84 | HR 79 | Wt 212.2 lb

## 2017-05-13 DIAGNOSIS — M1990 Unspecified osteoarthritis, unspecified site: Secondary | ICD-10-CM | POA: Insufficient documentation

## 2017-05-13 DIAGNOSIS — I5042 Chronic combined systolic (congestive) and diastolic (congestive) heart failure: Secondary | ICD-10-CM | POA: Diagnosis not present

## 2017-05-13 DIAGNOSIS — I110 Hypertensive heart disease with heart failure: Secondary | ICD-10-CM | POA: Insufficient documentation

## 2017-05-13 DIAGNOSIS — Z79899 Other long term (current) drug therapy: Secondary | ICD-10-CM | POA: Insufficient documentation

## 2017-05-13 DIAGNOSIS — I5022 Chronic systolic (congestive) heart failure: Secondary | ICD-10-CM | POA: Diagnosis not present

## 2017-05-13 DIAGNOSIS — I2510 Atherosclerotic heart disease of native coronary artery without angina pectoris: Secondary | ICD-10-CM | POA: Insufficient documentation

## 2017-05-13 DIAGNOSIS — I25118 Atherosclerotic heart disease of native coronary artery with other forms of angina pectoris: Secondary | ICD-10-CM

## 2017-05-13 DIAGNOSIS — G4733 Obstructive sleep apnea (adult) (pediatric): Secondary | ICD-10-CM | POA: Diagnosis not present

## 2017-05-13 DIAGNOSIS — Z794 Long term (current) use of insulin: Secondary | ICD-10-CM | POA: Diagnosis not present

## 2017-05-13 DIAGNOSIS — Z950 Presence of cardiac pacemaker: Secondary | ICD-10-CM | POA: Insufficient documentation

## 2017-05-13 DIAGNOSIS — Z7982 Long term (current) use of aspirin: Secondary | ICD-10-CM | POA: Insufficient documentation

## 2017-05-13 DIAGNOSIS — M109 Gout, unspecified: Secondary | ICD-10-CM | POA: Diagnosis not present

## 2017-05-13 DIAGNOSIS — I255 Ischemic cardiomyopathy: Secondary | ICD-10-CM | POA: Insufficient documentation

## 2017-05-13 DIAGNOSIS — E119 Type 2 diabetes mellitus without complications: Secondary | ICD-10-CM | POA: Insufficient documentation

## 2017-05-13 DIAGNOSIS — Z87891 Personal history of nicotine dependence: Secondary | ICD-10-CM | POA: Insufficient documentation

## 2017-05-13 DIAGNOSIS — E039 Hypothyroidism, unspecified: Secondary | ICD-10-CM | POA: Insufficient documentation

## 2017-05-13 DIAGNOSIS — I10 Essential (primary) hypertension: Secondary | ICD-10-CM

## 2017-05-13 LAB — BASIC METABOLIC PANEL
ANION GAP: 10 (ref 5–15)
BUN: 20 mg/dL (ref 6–20)
CHLORIDE: 102 mmol/L (ref 101–111)
CO2: 28 mmol/L (ref 22–32)
Calcium: 9.3 mg/dL (ref 8.9–10.3)
Creatinine, Ser: 1.09 mg/dL — ABNORMAL HIGH (ref 0.44–1.00)
GFR calc Af Amer: 58 mL/min — ABNORMAL LOW (ref >60)
GFR calc non Af Amer: 50 mL/min — ABNORMAL LOW (ref >60)
Glucose, Bld: 141 mg/dL — ABNORMAL HIGH (ref 65–99)
POTASSIUM: 4 mmol/L (ref 3.5–5.1)
SODIUM: 140 mmol/L (ref 135–145)

## 2017-05-13 LAB — BRAIN NATRIURETIC PEPTIDE: B NATRIURETIC PEPTIDE 5: 218.6 pg/mL — AB (ref 0.0–100.0)

## 2017-05-13 NOTE — Telephone Encounter (Signed)
Patient was referred to the HF [employer].  I have sent all appropriate paperwork via secure email to the Paramedic team.

## 2017-05-13 NOTE — Patient Instructions (Signed)
No changes to medication at this time.  Routine lab work today. Will notify you of abnormal results, otherwise no news is good news!  Follow up 2 months with [name] Clegg NP-C.  Take all medication as prescribed the day of your appointment. Bring all medications with you to your appointment.  Do the following things EVERYDAY: 1) Weigh yourself in the morning before breakfast. Write it down and keep it in a log. 2) Take your medicines as prescribed 3) Eat low salt foods-Limit salt (sodium) to 2000 mg per day.  4) Stay as active as you can everyday 5) Limit all fluids for the day to less than 2 liters

## 2017-05-14 ENCOUNTER — Encounter: Payer: Self-pay | Admitting: *Deleted

## 2017-05-14 NOTE — Telephone Encounter (Signed)
Informed patient of upcoming Titration and patient understanding was verbalized. Patient understands her sleep study is scheduled for Saturday June 15 2017. Patient understands her titration will be done at [facility] sleep lab. Patient understands she will receive a sleep packet in a week or so. Patient understands to call if she does not receive the sleep packet in a timely manner. Patient agrees with treatment and thanked me for call.

## 2017-05-16 ENCOUNTER — Other Ambulatory Visit (HOSPITAL_COMMUNITY): Payer: Self-pay | Admitting: Student

## 2017-05-16 ENCOUNTER — Telehealth (HOSPITAL_COMMUNITY): Payer: Self-pay

## 2017-05-16 NOTE — Telephone Encounter (Signed)
Danielle [name] was called today to establish initial contact and explain the program. She was happy to have the paramedicine group become part of her care team and said she would be able to meet tomorrow at 13:00. She currently is having headaches, earaches, a cough and blisters on her neck. She gave this information to the HF clinic staff on Monday and the told her to speak to her PCP. She does not have a primary care physician at this time so we will work to find a PCP during our meeting tomorrow.

## 2017-05-17 ENCOUNTER — Other Ambulatory Visit (HOSPITAL_COMMUNITY): Payer: Self-pay

## 2017-05-17 ENCOUNTER — Telehealth (HOSPITAL_COMMUNITY): Payer: Self-pay | Admitting: Pharmacist

## 2017-05-17 NOTE — Progress Notes (Signed)
Paramedicine Encounter    Patient ID: Danielle [name], female    DOB: 03-Mar-1946, 71 y.o.   MRN: [id number]   Patient Care Team: Seward Carol, MD as PCP - General (Internal Medicine)  Patient Active Problem List   Diagnosis Date Noted  . OSA (obstructive sleep apnea) 02/18/2017  . Acute combined systolic and diastolic congestive heart failure ([place]) 02/05/2017  . Essential hypertension   . Diabetes mellitus without complication ([place])   . Cancer ([place])   . Thyroid disease     Current Outpatient Medications:  .  acetaminophen (TYLENOL) 650 MG CR tablet, Take 650 mg by mouth every 8 (eight) hours as needed for pain., Disp: , Rfl:  .  allopurinol (ZYLOPRIM) 100 MG tablet, Take 100 mg by mouth daily., Disp: , Rfl:  .  Ascorbic Acid (VITAMIN C PO), Take 1 tablet by mouth daily., Disp: , Rfl:  .  aspirin 81 MG tablet, Take 1 tablet (81 mg total) by mouth daily., Disp: 30 tablet, Rfl: 3 .  atorvastatin (LIPITOR) 80 MG tablet, Take 1 tablet (80 mg total) by mouth at bedtime., Disp: 30 tablet, Rfl: 3 .  B Complex-C (SUPER B COMPLEX PO), Take 2 tablets by mouth daily., Disp: , Rfl:  .  Biotin 10 MG CAPS, Take 10 mg by mouth daily., Disp: , Rfl:  .  calcium citrate (CALCITRATE - DOSED IN MG ELEMENTAL CALCIUM) 950 MG tablet, Take 1 tablet by mouth 2 (two) times daily. , Disp: , Rfl:  .  carvedilol (COREG) 6.25 MG tablet, Take 1 tablet (6.25 mg total) by mouth 2 (two) times daily., Disp: 60 tablet, Rfl: 3 .  colchicine 0.6 MG tablet, Take 0.6 mg by mouth 2 (two) times daily., Disp: , Rfl:  .  ferrous sulfate 325 (65 FE) MG tablet, Take 325 mg by mouth 2 (two) times daily with a meal., Disp: , Rfl:  .  folic acid (FOLVITE) 1 MG tablet, Take 1 mg by mouth daily., Disp: , Rfl:  .  insulin glargine (LANTUS) 100 UNIT/ML injection, Inject 30 Units into the skin 2 (two) times daily. , Disp: , Rfl:  .  insulin lispro (HUMALOG) 100 UNIT/ML injection, Inject 5 Units into the skin daily with lunch. , Disp: ,  Rfl:  .  Lactobacillus (ACIDOPHILUS PO), Take 1 capsule by mouth daily., Disp: , Rfl:  .  levothyroxine (SYNTHROID, LEVOTHROID) 137 MCG tablet, Take 1 tablet (137 mcg total) daily before breakfast by mouth. Further refills must come from primary care doctor., Disp: 30 tablet, Rfl: 0 .  losartan (COZAAR) 25 MG tablet, Take 25 mg by mouth daily., Disp: , Rfl:  .  metFORMIN (GLUCOPHAGE) 500 MG tablet, Take 500 mg by mouth 2 (two) times daily with a meal. , Disp: , Rfl:  .  nitroGLYCERIN (NITROSTAT) 0.4 MG SL tablet, Place 1 tablet (0.4 mg total) under the tongue every 5 (five) minutes as needed for chest pain., Disp: 25 tablet, Rfl: 0 .  potassium chloride (K-DUR,KLOR-CON) 10 MEQ tablet, Take 20 mEq by mouth daily., Disp: , Rfl:  .  spironolactone (ALDACTONE) 25 MG tablet, Take 25 mg by mouth every other day., Disp: , Rfl:  .  ticagrelor (BRILINTA) 90 MG TABS tablet, Take 1 tablet (90 mg total) by mouth 2 (two) times daily., Disp: 60 tablet, Rfl: 3 .  torsemide (DEMADEX) 20 MG tablet, Take 20 mg by mouth daily. One tab in the AM and two tabs in the PM, Disp: , Rfl:  .  Travoprost, BAK Free, (TRAVATAN) 0.004 % SOLN ophthalmic solution, Place 1 drop into both eyes daily., Disp: , Rfl:  .  CHONDROITIN SULFATE PO, Take 250 mg by mouth daily., Disp: , Rfl:  .  isosorbide mononitrate (IMDUR) 30 MG 24 hr tablet, Take 1 tablet (30 mg total) by mouth daily. (Patient not taking: Reported on 05/13/2017), Disp: 30 tablet, Rfl: 3 .  omeprazole (PRILOSEC) 40 MG capsule, Take 40 mg by mouth daily., Disp: , Rfl:  .  predniSONE (DELTASONE) 20 MG tablet, Take 40 mg (2 tabs) daily for three days (until bottle empty). (Patient not taking: Reported on 05/17/2017), Disp: 6 tablet, Rfl: 0 .  torsemide (DEMADEX) 20 MG tablet, Take 2 tablets (40 mg total) 2 (two) times daily by mouth. (Patient not taking: Reported on 05/17/2017), Disp: 120 tablet, Rfl: 6 Allergies  Allergen Reactions  . Sulfa Antibiotics Rash      Social  History   Socioeconomic History  . Marital status: Married    Spouse name: Not on file  . Number of children: Not on file  . Years of education: Not on file  . Highest education level: Not on file  Social Needs  . Financial resource strain: Not on file  . Food insecurity - worry: Not on file  . Food insecurity - inability: Not on file  . Transportation needs - medical: Not on file  . Transportation needs - non-medical: Not on file  Occupational History  . Not on file  Tobacco Use  . Smoking status: Former Smoker    Packs/day: 1.00    Years: 22.00    Pack years: 22.00    Types: Cigarettes    Last attempt to quit: 1983    Years since quitting: 35.9  . Smokeless tobacco: Never Used  Substance and Sexual Activity  . Alcohol use: Yes    Comment: ocassional  . Drug use: No  . Sexual activity: No    Birth control/protection: None, Post-menopausal  Other Topics Concern  . Not on file  Social History Narrative  . Not on file    Physical Exam  Constitutional: She is oriented to person, place, and time.  Cardiovascular: Normal rate and regular rhythm.  Pulmonary/Chest: Effort normal and breath sounds normal.  Musculoskeletal: Normal range of motion. She exhibits edema.  Neurological: She is alert and oriented to person, place, and time.  Skin: Skin is warm and dry.  Psychiatric: She has a normal mood and affect.    SAFE - 05/17/17 1400      Situation   Admitting diagnosis  chf    Heart failure history  Exisiting    Comorbidities  DM;HTN    Readmitted within 30 days  No    Hospital admission within past 12 months  Yes    number of hospital admissions  1    number of ED visits  1      Assessment   Lives alone  Yes    Primary support person  Janiya Millirons    Mode of transportation  personal car    Other services involved  None    Home equipement  Cane;Scale;Walker      Weight   Weighs self daily  Yes    Scale provided  No    Records on weight chart  Yes       Resources   Has "Living better w/heart failure" book  Yes    Has HF Zone tool  Yes    Able to identify  yellow zone signs/when to call MD  Yes    Records zone daily  Yes      Medications   Uses a pill box  Yes    Who stocks the pill box  Paramedic    Pill box checked this visit  Yes    Pill box refilled this visit  Yes    Difficulty obtaining medications  No    Mail order medications  No    Missed one or more doses of medications per week  No      Nutrition   Patient receives meals on wheels  No    Patient follows low sodium diet  Yes    Has foods at home that meet the current recommended diet  Yes    Patient follows low sugar/card diet  Yes    Nutritional concerns/issues  no      Activity Level   ADL's/Mobility  Independent    How many feet can patient ambulate  100    Typical activity level  active    Barriers  Knee pain      Urine   Difficulty urinating  No    Changes in urine  None      Time spent with patient   Time spent with patient   [place] - 05/17/17 1400      Outside of House   Sidewalk and pathway to house is level and free from any hazards  Yes    Driveway is free from debris/snow/ice  Yes    Outside stairs are stable and have sturdy handrail  N/A    Porch lights are working and provide adequate lighting  Yes      Living Room   Furniture is of adequate height and offers arm rests that assist in getting up and down  Yes    Floor is free from any clutter that would create tripping hazards  Yes    All cords are either behind furniture or secured in a manner that does not cause trip hazards  Yes    All rugs are secured to floor with double-sided tape  No    Lighting is adequate to light room  Yes    All lighting has an easily accessible on/off switch  Yes    Phone is readily accessible near favorite seating areas  Yes    Emergency numbers are printed near all phones in house  No      Kitchen   Items used  most often are within easy reach on low shelves  Yes    Step stool is present, is sturdy and has a handrail  No    Floor mats are non-slip tread and secured to floor  N/A    Oven controls are within easy reach  Yes    Kitchen lighting is adequate and easy to reach switches  Yes    ABC fire extinguisher is located in kitchen  No      [address] is properly secured to stairs and/or all wood is properly secured  N/A    Handrail is present and sturdy  N/A    Stairs are free from any clutter  N/A    Stairway is adequately lit  N/A      Bathroom   Tub and shower have a non-slip surface  Yes    Tub and/or shower have a grab bar for stability  Yes  Toilet has a raised seat  No    Grab bar is attached near toilet for assistance  No    Pathway from bedroom to bathroom is free from clutter and well lit for ease of movement in the middle of the night  Yes      Bedroom   Floor is free from clutter  Yes    Light is near bed and is easy to turn on  No    Phone is next to bed and within easy reach  Yes    Flashlight is near bed in case of emergency  Yes      General   Smoke detectors in all areas of the house (each floor) and tested  Yes    CO detectors on each floor of house and tested  No    Flashlights are handy throughout the home  Yes    Resident has all medical information readily available and in an area emergency providers will easily find  Yes    All heaters are away from any type of flammable material  N/A      Overall Tips   Homeowner ha good non-skid shoes to move around house  Yes    All assisted walking devices are readily accessible and in good condition  Yes    There is a phone near the floor for ease of reach in case of a fall  YES    All O2 tubing is less than 50 ft. and is not a trip hazard  N/A    Resident has had an annual hearing and vision check by a physician  Yes    Resident has the proper hearing and visual aids prescribed and are in good working order  Yes     All medications are properly stored and labeled to avoid confusion on dosage, time to take, and avoidance of missed doses  Yes        Future Appointments  Date Time Provider [place]  05/27/2017 12:00 PM [facility] UNDERGRAD [facility] None  06/15/2017  8:00 PM [facility] ROOM 3 [facility]  06/25/2017 11:15 AM Constance Haw, MD [facility]  07/08/2017  9:30 AM [facility] PA/NP [facility] None  07/30/2017 10:55 AM [facility] DEVICE REMOTES [facility]    BP 112/68 (BP Location: Right Arm, Patient Position: Sitting, Cuff Size: Large)   Pulse 60   Resp 18   Wt 207 lb (93.9 kg)   SpO2 96%   BMI 35.53 kg/m   Weight yesterday- 210 lb Last visit weight- N/A   Ms Fowle was seen at home today for the first time. She reported having earaches, headaches and a rash on her neck. She said she believes she may have some sort of ear or sinus infection. She mentioned this to the clinic staff on Monday but they advised her to follow up with her PCP, which she has not done yet. She had lower extremity edema but denied SOB and actually had weight loss since yesterday. She will need more charts for recording her weight and HF zone. She had ben using a pillbox but it only has one space for each day. We changed her to a pillbox with 4 slots per day I filled it for her. She was out of some medications which she had ordered already. Listed below are the medications she is short and where they go when she picks them up from the pharmacy.  atorvastatin needed in Wed and Thu levothyroxine needed in Wed-Fri  Allopurinol needed in Wed-Thu   [name], [place] 05/17/17  ACTION: Home visit completed Next visit planned for 1 week

## 2017-05-17 NOTE — Telephone Encounter (Signed)
Received a message from [place] (paramedic) that Ms. Perris never increased her losartan to 50 mg daily as prescribed in October. Since she was stable on losartan 25 mg daily at last clinic visit and no medication changes were made, will continue on losartan 25 mg daily for now and reconsider uptitration at next visit in February.   Ruta Hinds. Velva Harman, PharmD, BCPS, CPP Clinical Pharmacist Pager: [phone] Phone: [phone] 05/17/2017 1:37 PM

## 2017-05-19 ENCOUNTER — Other Ambulatory Visit (HOSPITAL_COMMUNITY): Payer: Self-pay | Admitting: Student

## 2017-05-24 ENCOUNTER — Telehealth (HOSPITAL_COMMUNITY): Payer: Self-pay

## 2017-05-24 ENCOUNTER — Other Ambulatory Visit (HOSPITAL_COMMUNITY): Payer: Self-pay

## 2017-05-24 MED ORDER — TORSEMIDE 20 MG PO TABS: 40.0000 mg | ORAL_TABLET | Freq: Two times a day (BID) | ORAL | 6 refills | Status: DC

## 2017-05-24 MED ORDER — TICAGRELOR 90 MG PO TABS: 90.0000 mg | ORAL_TABLET | Freq: Two times a day (BID) | ORAL | 3 refills | Status: DC

## 2017-05-24 NOTE — Progress Notes (Signed)
Paramedicine Encounter    Patient ID: Danielle [name], female    DOB: [birth year], 71 y.o.   MRN: [id number]   Patient Care Team: Seward Carol, MD as PCP - General (Internal Medicine)  Patient Active Problem List   Diagnosis Date Noted  . OSA (obstructive sleep apnea) 02/18/2017  . Acute combined systolic and diastolic congestive heart failure ([place]) 02/05/2017  . Essential hypertension   . Diabetes mellitus without complication ([place])   . Cancer ([place])   . Thyroid disease     Current Outpatient Medications:  .  acetaminophen (TYLENOL) 650 MG CR tablet, Take 650 mg by mouth every 8 (eight) hours as needed for pain., Disp: , Rfl:  .  allopurinol (ZYLOPRIM) 100 MG tablet, Take 100 mg by mouth daily., Disp: , Rfl:  .  Ascorbic Acid (VITAMIN C PO), Take 1 tablet by mouth daily., Disp: , Rfl:  .  aspirin 81 MG tablet, Take 1 tablet (81 mg total) by mouth daily., Disp: 30 tablet, Rfl: 3 .  atorvastatin (LIPITOR) 80 MG tablet, Take 1 tablet (80 mg total) by mouth at bedtime., Disp: 30 tablet, Rfl: 3 .  B Complex-C (SUPER B COMPLEX PO), Take 2 tablets by mouth daily., Disp: , Rfl:  .  Biotin 10 MG CAPS, Take 10 mg by mouth daily., Disp: , Rfl:  .  calcium citrate (CALCITRATE - DOSED IN MG ELEMENTAL CALCIUM) 950 MG tablet, Take 1 tablet by mouth 2 (two) times daily. , Disp: , Rfl:  .  carvedilol (COREG) 6.25 MG tablet, Take 1 tablet (6.25 mg total) by mouth 2 (two) times daily., Disp: 60 tablet, Rfl: 3 .  ferrous sulfate 325 (65 FE) MG tablet, Take 325 mg by mouth 2 (two) times daily with a meal., Disp: , Rfl:  .  folic acid (FOLVITE) 1 MG tablet, Take 1 mg by mouth daily., Disp: , Rfl:  .  insulin glargine (LANTUS) 100 UNIT/ML injection, Inject 30 Units into the skin 2 (two) times daily. , Disp: , Rfl:  .  insulin lispro (HUMALOG) 100 UNIT/ML injection, Inject 5 Units into the skin daily with lunch. , Disp: , Rfl:  .  Lactobacillus (ACIDOPHILUS PO), Take 1 capsule by mouth daily., Disp: , Rfl:  .   levothyroxine (SYNTHROID, LEVOTHROID) 137 MCG tablet, Take 1 tablet (137 mcg total) daily before breakfast by mouth. Further refills must come from primary care doctor., Disp: 30 tablet, Rfl: 0 .  losartan (COZAAR) 25 MG tablet, Take 25 mg by mouth daily., Disp: , Rfl:  .  metFORMIN (GLUCOPHAGE) 500 MG tablet, Take 500 mg by mouth 2 (two) times daily with a meal. , Disp: , Rfl:  .  nitroGLYCERIN (NITROSTAT) 0.4 MG SL tablet, Place 1 tablet (0.4 mg total) under the tongue every 5 (five) minutes as needed for chest pain., Disp: 25 tablet, Rfl: 0 .  potassium chloride (K-DUR,KLOR-CON) 10 MEQ tablet, Take 20 mEq by mouth daily., Disp: , Rfl:  .  spironolactone (ALDACTONE) 25 MG tablet, Take 25 mg by mouth every other day., Disp: , Rfl:  .  ticagrelor (BRILINTA) 90 MG TABS tablet, Take 1 tablet (90 mg total) by mouth 2 (two) times daily., Disp: 60 tablet, Rfl: 3 .  torsemide (DEMADEX) 20 MG tablet, Take 20 mg by mouth daily. One tab in the AM and two tabs in the PM, Disp: , Rfl:  .  Travoprost, BAK Free, (TRAVATAN) 0.004 % SOLN ophthalmic solution, Place 1 drop into both eyes daily., Disp: , Rfl:  .  CHONDROITIN SULFATE PO, Take 250 mg by mouth daily., Disp: , Rfl:  .  colchicine 0.6 MG tablet, Take 0.6 mg by mouth 2 (two) times daily., Disp: , Rfl:  .  isosorbide mononitrate (IMDUR) 30 MG 24 hr tablet, Take 1 tablet (30 mg total) by mouth daily. (Patient not taking: Reported on 05/13/2017), Disp: 30 tablet, Rfl: 3 .  omeprazole (PRILOSEC) 40 MG capsule, Take 40 mg by mouth daily., Disp: , Rfl:  .  predniSONE (DELTASONE) 20 MG tablet, Take 40 mg (2 tabs) daily for three days (until bottle empty). (Patient not taking: Reported on 05/17/2017), Disp: 6 tablet, Rfl: 0 .  torsemide (DEMADEX) 20 MG tablet, Take 2 tablets (40 mg total) 2 (two) times daily by mouth. (Patient not taking: Reported on 05/24/2017), Disp: 120 tablet, Rfl: 6 Allergies  Allergen Reactions  . Sulfa Antibiotics Rash      Social  History   Socioeconomic History  . Marital status: Married    Spouse name: Not on file  . Number of children: Not on file  . Years of education: Not on file  . Highest education level: Not on file  Social Needs  . Financial resource strain: Not on file  . Food insecurity - worry: Not on file  . Food insecurity - inability: Not on file  . Transportation needs - medical: Not on file  . Transportation needs - non-medical: Not on file  Occupational History  . Not on file  Tobacco Use  . Smoking status: Former Smoker    Packs/day: 1.00    Years: 22.00    Pack years: 22.00    Types: Cigarettes    Last attempt to quit: 1983    Years since quitting: 36.0  . Smokeless tobacco: Never Used  Substance and Sexual Activity  . Alcohol use: Yes    Comment: ocassional  . Drug use: No  . Sexual activity: No    Birth control/protection: None, Post-menopausal  Other Topics Concern  . Not on file  Social History Narrative  . Not on file    Physical Exam  Constitutional: She is oriented to person, place, and time.  Cardiovascular: Normal rate and regular rhythm.  Pulmonary/Chest: Effort normal and breath sounds normal.  Abdominal: Soft. She exhibits no distension.  Musculoskeletal: Normal range of motion. She exhibits edema.  Neurological: She is alert and oriented to person, place, and time.  Skin: Skin is warm and dry.    SAFE - 05/17/17 1400      Situation   Admitting diagnosis  chf    Heart failure history  Exisiting    Comorbidities  DM;HTN    Readmitted within 30 days  No    Hospital admission within past 12 months  Yes    number of hospital admissions  1    number of ED visits  1      Assessment   Lives alone  Yes    Primary support person  Virginie Josten    Mode of transportation  personal car    Other services involved  None    Home equipement  Cane;Scale;Walker      Weight   Weighs self daily  Yes    Scale provided  No    Records on weight chart  Yes       Resources   Has "Living better w/heart failure" book  Yes    Has HF Zone tool  Yes    Able to identify yellow zone signs/when to call  MD  Yes    Records zone daily  Yes      Medications   Uses a pill box  Yes    Who stocks the pill box  Paramedic    Pill box checked this visit  Yes    Pill box refilled this visit  Yes    Difficulty obtaining medications  No    Mail order medications  No    Missed one or more doses of medications per week  No      Nutrition   Patient receives meals on wheels  No    Patient follows low sodium diet  Yes    Has foods at home that meet the current recommended diet  Yes    Patient follows low sugar/card diet  Yes    Nutritional concerns/issues  no      Activity Level   ADL's/Mobility  Independent    How many feet can patient ambulate  100    Typical activity level  active    Barriers  Knee pain      Urine   Difficulty urinating  No    Changes in urine  None      Time spent with patient   Time spent with patient   90 Minutes         Future Appointments  Date Time Provider [place]  05/27/2017 12:00 PM [facility]AD [facility] None  06/15/2017  8:00 PM [facility] ROOM 3 [facility]  06/25/2017 11:15 AM Constance Haw, MD [facility]  07/08/2017  9:30 AM [facility] PA/NP [facility] None  07/30/2017 10:55 AM [facility] DEVICE REMOTES [facility]    BP 100/62 (BP Location: Right Arm, Patient Position: Sitting, Cuff Size: Large)   Pulse 84   Resp 16   Wt 207 lb (93.9 kg)   SpO2 97%   BMI 35.53 kg/m   Weight yesterday- 205 lb Last visit weight- 207 lb  Ms Constantin was seen at home today and reported having bilateral ear aches. She denied SOB, headache, dizziness, or orthopnea. She reports taking all her medications. She needs a few medications added to her epic medication list. Which I will talk about with Doroteo Bradford on Monday. She ran out of Brilinta, spironolactone, and potassium but they have been  ordered ans she will pick them up tomorrow and I left instructions on where to put them in her pillbox. Spironolactone was placed in  Sat/Mon/Wed this week. Isosorbide in on her medications list but she is not taking it so we will need to bring this up with the clinic next week.   Needs Brilinta Sunday- Friday  Needs Potassium Tues-Fri    Add to list: Meloxicam  Fish oil Women's Alive Multivitamin  Time spent with patient: 13 minutes   [name], EMT 05/24/17  ACTION: Home visit completed Next visit planned for 1 week

## 2017-05-24 NOTE — Telephone Encounter (Signed)
I called Danielle [name] to schedule an appointment. She said she was not going to be home before 14:30 today and requested I call her back then.

## 2017-05-27 ENCOUNTER — Encounter (HOSPITAL_COMMUNITY)
Admission: RE | Admit: 2017-05-27 | Discharge: 2017-05-27 | Disposition: A | Discharge status: Home / Self Care | Payer: Medicare Other | Source: Ambulatory Visit | Attending: Internal Medicine | Admitting: Internal Medicine

## 2017-05-27 ENCOUNTER — Encounter (HOSPITAL_COMMUNITY): Payer: Self-pay

## 2017-05-27 VITALS — BP 136/68 | HR 75 | Ht 63.0 in | Wt 209.4 lb

## 2017-05-27 DIAGNOSIS — J209 Acute bronchitis, unspecified: Secondary | ICD-10-CM | POA: Diagnosis not present

## 2017-05-27 DIAGNOSIS — I5022 Chronic systolic (congestive) heart failure: Secondary | ICD-10-CM | POA: Insufficient documentation

## 2017-05-27 DIAGNOSIS — J329 Chronic sinusitis, unspecified: Secondary | ICD-10-CM | POA: Diagnosis not present

## 2017-05-27 DIAGNOSIS — H6091 Unspecified otitis externa, right ear: Secondary | ICD-10-CM | POA: Diagnosis not present

## 2017-05-27 NOTE — Progress Notes (Signed)
Danielle [name] 71 y.o. female Pulmonary Rehab Orientation Note Patient arrived today in Cardiac and Pulmonary Rehab for orientation to Pulmonary Rehab. She was transported from [employer] via wheel chair. She does not carry portable oxygen. Per pt, she never uses oxygen, is being evaluated for sleep apnea.  She has had the sleep study and is waiting for the results to be explained to her. Color good, skin warm and dry. Patient is oriented to time and place. Patient's medical history, psychosocial health, and medications reviewed. Psychosocial assessment reveals pt lives alone. 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 Pt hobbies include reading, writing, and involved in her church. Pt reports her stress level is high. Areas of stress/anxiety include her health and sickness in her family.  Pt does not exhibit signs of depression. PHQ2/9 score 0 /0. Pt shows good  coping skills with positive outlook .  Offered emotional support and reassurance. Will continue to monitor and evaluate progress toward psychosocial goal(s) of increased strength and endurance with physical activities. Physical assessment reveals heart rate is normal, breath sounds clear to auscultation, no wheezes, rales, or rhonchi. Grip strength equal, strong. Distal pulses not palpable due to 3+ pitting ankle edema.  Patient states this level of edema is her "normal". Patient reports she does take medications as prescribed. She has a lengthy medication list and this took quite a bit of time.  Patient states she follows a Low Sodium diet. The patient reports no specific efforts to gain or lose weight.. Patient's weight will be monitored closely. Demonstration and practice of PLB using pulse oximeter. Patient able to return demonstration satisfactorily. Safety and hand hygiene in the exercise area reviewed with patient. Patient voices understanding of the information reviewed. Department  expectations discussed with patient and achievable goals were set. The patient shows enthusiasm about attending the program and we look forward to working with this nice lady. The patient is scheduled for a 6 min walk test on Thursday, May 30, 2017 @ 3:45pm and to begin exercise on Thursday, June 06, 2017 in the 10:30 am class.  [id number]

## 2017-05-30 ENCOUNTER — Encounter (HOSPITAL_COMMUNITY)
Admission: RE | Admit: 2017-05-30 | Discharge: 2017-05-30 | Disposition: A | Discharge status: Home / Self Care | Payer: Medicare HMO | Source: Ambulatory Visit | Attending: Internal Medicine | Admitting: Internal Medicine

## 2017-05-30 DIAGNOSIS — I5022 Chronic systolic (congestive) heart failure: Secondary | ICD-10-CM | POA: Diagnosis not present

## 2017-05-31 ENCOUNTER — Other Ambulatory Visit (HOSPITAL_COMMUNITY): Payer: Self-pay | Admitting: Pharmacist

## 2017-05-31 ENCOUNTER — Other Ambulatory Visit (HOSPITAL_COMMUNITY): Payer: Self-pay

## 2017-05-31 MED ORDER — ISOSORBIDE MONONITRATE ER 30 MG PO TB24: 30.0000 mg | ORAL_TABLET | Freq: Every day | ORAL | 3 refills | Status: DC

## 2017-05-31 NOTE — Progress Notes (Signed)
Pulmonary Individual Treatment Plan  Patient Details  Name: Danielle [name] MRN: [id number] Date of Birth: [birth year] Referring Provider:     Pulmonary Rehab Walk Test from 05/30/2017 in [facility]  Referring Provider  Dr. Haroldine Laws      Initial Encounter Date:    Pulmonary Rehab Walk Test from 05/30/2017 in [facility]  Date  05/31/17  Referring Provider  Dr. Haroldine Laws      Visit Diagnosis: Heart failure, chronic systolic ([place])  Patient's Home Medications on Admission:   Current Outpatient Medications:  .  acetaminophen (TYLENOL) 650 MG CR tablet, Take 650 mg by mouth every 8 (eight) hours as needed for pain., Disp: , Rfl:  .  allopurinol (ZYLOPRIM) 100 MG tablet, Take 100 mg by mouth daily., Disp: , Rfl:  .  Ascorbic Acid (VITAMIN C PO), Take 1 tablet by mouth daily., Disp: , Rfl:  .  aspirin 81 MG tablet, Take 1 tablet (81 mg total) by mouth daily., Disp: 30 tablet, Rfl: 3 .  atorvastatin (LIPITOR) 80 MG tablet, Take 1 tablet (80 mg total) by mouth at bedtime., Disp: 30 tablet, Rfl: 3 .  B Complex-C (SUPER B COMPLEX PO), Take 2 tablets by mouth daily., Disp: , Rfl:  .  Biotin 10 MG CAPS, Take 10 mg by mouth daily., Disp: , Rfl:  .  calcium citrate (CALCITRATE - DOSED IN MG ELEMENTAL CALCIUM) 950 MG tablet, Take 1 tablet by mouth 2 (two) times daily. , Disp: , Rfl:  .  carvedilol (COREG) 6.25 MG tablet, Take 1 tablet (6.25 mg total) by mouth 2 (two) times daily., Disp: 60 tablet, Rfl: 3 .  CHONDROITIN SULFATE PO, Take 250 mg by mouth daily., Disp: , Rfl:  .  colchicine 0.6 MG tablet, Take 0.6 mg by mouth 2 (two) times daily., Disp: , Rfl:  .  ferrous sulfate 325 (65 FE) MG tablet, Take 325 mg by mouth 2 (two) times daily with a meal., Disp: , Rfl:  .  folic acid (FOLVITE) 1 MG tablet, Take 1 mg by mouth daily., Disp: , Rfl:  .  insulin glargine (LANTUS) 100 UNIT/ML injection, Inject 30 Units into the skin 2 (two) times  daily. , Disp: , Rfl:  .  insulin lispro (HUMALOG) 100 UNIT/ML injection, Inject 5 Units into the skin daily with lunch. , Disp: , Rfl:  .  isosorbide mononitrate (IMDUR) 30 MG 24 hr tablet, Take 1 tablet (30 mg total) by mouth daily., Disp: 30 tablet, Rfl: 3 .  Lactobacillus (ACIDOPHILUS PO), Take 1 capsule by mouth daily., Disp: , Rfl:  .  levothyroxine (SYNTHROID, LEVOTHROID) 137 MCG tablet, Take 1 tablet (137 mcg total) daily before breakfast by mouth. Further refills must come from primary care doctor., Disp: 30 tablet, Rfl: 0 .  losartan (COZAAR) 25 MG tablet, Take 25 mg by mouth daily., Disp: , Rfl:  .  metFORMIN (GLUCOPHAGE) 500 MG tablet, Take 500 mg by mouth 2 (two) times daily with a meal. , Disp: , Rfl:  .  nitroGLYCERIN (NITROSTAT) 0.4 MG SL tablet, Place 1 tablet (0.4 mg total) under the tongue every 5 (five) minutes as needed for chest pain., Disp: 25 tablet, Rfl: 0 .  omeprazole (PRILOSEC) 40 MG capsule, Take 40 mg by mouth daily., Disp: , Rfl:  .  potassium chloride (K-DUR,KLOR-CON) 10 MEQ tablet, Take 20 mEq by mouth daily., Disp: , Rfl:  .  predniSONE (DELTASONE) 20 MG tablet, Take 40 mg (2 tabs)  daily for three days (until bottle empty). (Patient not taking: Reported on 05/17/2017), Disp: 6 tablet, Rfl: 0 .  spironolactone (ALDACTONE) 25 MG tablet, Take 25 mg by mouth every other day., Disp: , Rfl:  .  ticagrelor (BRILINTA) 90 MG TABS tablet, Take 1 tablet (90 mg total) by mouth 2 (two) times daily., Disp: 60 tablet, Rfl: 3 .  torsemide (DEMADEX) 20 MG tablet, Take 20 mg by mouth daily. One tab in the AM and two tabs in the PM, Disp: , Rfl:  .  torsemide (DEMADEX) 20 MG tablet, Take 2 tablets (40 mg total) by mouth 2 (two) times daily., Disp: 120 tablet, Rfl: 6 .  Travoprost, BAK Free, (TRAVATAN) 0.004 % SOLN ophthalmic solution, Place 1 drop into both eyes daily., Disp: , Rfl:   Past Medical History: Past Medical History:  Diagnosis Date  . Arthritis   . Blood dyscrasia     per pt-has small blood cells-appears as if anemic  . Cancer (HCC)    breast  . CHF (congestive heart failure) ([place])   . Complication of anesthesia    difficult to awaken from per pt  . Diabetes mellitus without complication ([place])   . Dyspnea   . Essential hypertension   . Heart disease   . Hypothyroidism   . Presence of permanent cardiac pacemaker   . Sleep apnea   . Thyroid disease    hypothyroid    Tobacco Use: Social History   Tobacco Use  Smoking Status Former Smoker  . Packs/day: 1.00  . Years: 22.00  . Pack years: 22.00  . Types: Cigarettes  . Last attempt to quit: 1983  . Years since quitting: 36.0  Smokeless Tobacco Never Used    Labs: Recent Review Scientist, physiological    Labs for ITP Cardiac and Pulmonary Rehab Latest Ref Rng & Units 03/06/2017   Cholestrol 0 - 200 mg/dL 87   LDLCALC 0 - 99 mg/dL 24   HDL >40 mg/dL 38(L)   Trlycerides <150 mg/dL 123      Capillary Blood Glucose: Lab Results  Component Value Date   GLUCAP 144 (H) 02/06/2017   GLUCAP 131 (H) 02/06/2017   GLUCAP 162 (H) 02/05/2017   GLUCAP 144 (H) 02/05/2017   GLUCAP 140 (H) 02/05/2017     Pulmonary Assessment Scores: Pulmonary Assessment Scores    Row Name 05/31/17 0718         ADL UCSD   ADL Phase  Entry       mMRC Score   mMRC Score  0        Pulmonary Function Assessment: Pulmonary Function Assessment - 05/27/17 1333      Breath   Bilateral Breath Sounds  Clear    Shortness of Breath  Yes       Exercise Target Goals: Date: 05/31/17  Exercise Program Goal: Individual exercise prescription set with THRR, safety & activity barriers. Participant demonstrates ability to understand and report RPE using BORG scale, to self-measure pulse accurately, and to acknowledge the importance of the exercise prescription.  Exercise Prescription Goal: Starting with aerobic activity 30 plus minutes a day, 3 days per week for initial exercise prescription. Provide home exercise  prescription and guidelines that participant acknowledges understanding prior to discharge.  Activity Barriers & Risk Stratification:   6 Minute Walk: 6 Minute Walk    Row Name 05/31/17 0714         6 Minute Walk   Phase  Initial     Distance  900 feet     Walk Time  6 minutes     # of Rest Breaks  0     MPH  1.7     METS  2.3     RPE  12     Perceived Dyspnea   3     Symptoms  Yes (comment)     Comments  7/10 knee pin     Resting HR  99 bpm     Resting BP  94/60     Resting Oxygen Saturation   92 %     Exercise Oxygen Saturation  during 6 min walk  91 %     Max Ex. HR  110 bpm     Max Ex. BP  138/68       Interval HR   1 Minute HR  99     2 Minute HR  103     3 Minute HR  102     4 Minute HR  102     5 Minute HR  110     6 Minute HR  106     2 Minute Post HR  110     Interval Heart Rate?  Yes       Interval Oxygen   Interval Oxygen?  Yes     Baseline Oxygen Saturation %  92 %     1 Minute Oxygen Saturation %  96 %     1 Minute Liters of Oxygen  0 L     2 Minute Oxygen Saturation %  95 %     2 Minute Liters of Oxygen  0 L     3 Minute Oxygen Saturation %  94 %     3 Minute Liters of Oxygen  0 L     4 Minute Oxygen Saturation %  94 %     4 Minute Liters of Oxygen  0 L     5 Minute Oxygen Saturation %  91 %     5 Minute Liters of Oxygen  0 L     6 Minute Oxygen Saturation %  91 %     6 Minute Liters of Oxygen  0 L     2 Minute Post Oxygen Saturation %  98 %     2 Minute Post Liters of Oxygen  0 L        Oxygen Initial Assessment: Oxygen Initial Assessment - 05/31/17 0714      Initial 6 min Walk   Oxygen Used  None      Program Oxygen Prescription   Program Oxygen Prescription  None       Oxygen Re-Evaluation:   Oxygen Discharge (Final Oxygen Re-Evaluation):   Initial Exercise Prescription: Initial Exercise Prescription - 05/31/17 0700      Date of Initial Exercise RX and Referring Provider   Date  05/31/17    Referring Provider  Dr.  Haroldine Laws      NuStep   Level  2    SPM  80    Minutes  17    METs  1.5      Arm Ergometer   Level  1    Watts  10    Minutes  17      Track   Laps  7    Minutes  17      Prescription Details   Frequency (times per week)  2    Duration  Progress to 45 minutes of aerobic exercise  without signs/symptoms of physical distress      Intensity   THRR 40-80% of Max Heartrate  60-120    Ratings of Perceived Exertion  11-13    Perceived Dyspnea  0-4      Progression   Progression  Continue progressive overload as per policy without signs/symptoms or physical distress.      Resistance Training   Training Prescription  Yes    Weight  orange bands    Reps  10-15       Perform Capillary Blood Glucose checks as needed.  Exercise Prescription Changes:   Exercise Comments:   Exercise Goals and Review:   Exercise Goals Re-Evaluation :   Discharge Exercise Prescription (Final Exercise Prescription Changes):   Nutrition:  Target Goals: Understanding of nutrition guidelines, daily intake of sodium 1500mg , cholesterol 200mg , calories 30% from fat and 7% or less from saturated fats, daily to have 5 or more servings of fruits and vegetables.  Biometrics: Pre Biometrics - 05/27/17 1336      Pre Biometrics   Grip Strength  22 kg        Nutrition Therapy Plan and Nutrition Goals:   Nutrition Discharge: Rate Your Plate Scores:   Nutrition Goals Re-Evaluation:   Nutrition Goals Discharge (Final Nutrition Goals Re-Evaluation):   Psychosocial: Target Goals: Acknowledge presence or absence of significant depression and/or stress, maximize coping skills, provide positive support system. Participant is able to verbalize types and ability to use techniques and skills needed for reducing stress and depression.  Initial Review & Psychosocial Screening: Initial Psych Review & Screening - 05/27/17 1340      Initial Review   Current issues with  None Identified       Family Dynamics   Good Support System?  Yes      Barriers   Psychosocial barriers to participate in program  There are no identifiable barriers or psychosocial needs.      Screening Interventions   Interventions  Encouraged to exercise       Quality of Life Scores:   PHQ-9: Recent Review Flowsheet Data    Depression screen [facility] 2/9 05/27/2017   Decreased Interest 0   Down, Depressed, Hopeless 0   PHQ - 2 Score 0     Interpretation of Total Score  Total Score Depression Severity:  1-4 = Minimal depression, 5-9 = Mild depression, 10-14 = Moderate depression, 15-19 = Moderately severe depression, 20-27 = Severe depression   Psychosocial Evaluation and Intervention: Psychosocial Evaluation - 05/27/17 1341      Psychosocial Evaluation & Interventions   Interventions  Encouraged to exercise with the program and follow exercise prescription    Continue Psychosocial Services   No Follow up required       Psychosocial Re-Evaluation:   Psychosocial Discharge (Final Psychosocial Re-Evaluation):   Education: Education Goals: Education classes will be provided on a weekly basis, covering required topics. Participant will state understanding/return demonstration of topics presented.  Learning Barriers/Preferences: Learning Barriers/Preferences - 05/27/17 1332      Learning Barriers/Preferences   Learning Barriers  None    Learning Preferences  Verbal Instruction;Video;Computer/Internet;Audio;Written Material       Education Topics: Risk Factor Reduction:  -Group instruction that is supported by a PowerPoint presentation. Instructor discusses the definition of a risk factor, different risk factors for pulmonary disease, and how the heart and lungs work together.     Nutrition for Pulmonary Patient:  -Group instruction provided by PowerPoint slides, verbal discussion, and written materials to support  subject matter. The instructor gives an explanation and review of healthy  diet recommendations, which includes a discussion on weight management, recommendations for fruit and vegetable consumption, as well as protein, fluid, caffeine, fiber, sodium, sugar, and alcohol. Tips for eating when patients are short of breath are discussed.   Pursed Lip Breathing:  -Group instruction that is supported by demonstration and informational handouts. Instructor discusses the benefits of pursed lip and diaphragmatic breathing and detailed demonstration on how to preform both.     Oxygen Safety:  -Group instruction provided by PowerPoint, verbal discussion, and written material to support subject matter. There is an overview of "What is Oxygen" and "Why do we need it".  Instructor also reviews how to create a safe environment for oxygen use, the importance of using oxygen as prescribed, and the risks of noncompliance. There is a brief discussion on traveling with oxygen and resources the patient may utilize.   Oxygen Equipment:  -Group instruction provided by [facility] Staff utilizing handouts, written materials, and equipment demonstrations.   Signs and Symptoms:  -Group instruction provided by written material and verbal discussion to support subject matter. Warning signs and symptoms of infection, stroke, and heart attack are reviewed and when to call the physician/911 reinforced. Tips for preventing the spread of infection discussed.   Advanced Directives:  -Group instruction provided by verbal instruction and written material to support subject matter. Instructor reviews Advanced Directive laws and proper instruction for filling out document.   Pulmonary Video:  -Group video education that reviews the importance of medication and oxygen compliance, exercise, good nutrition, pulmonary hygiene, and pursed lip and diaphragmatic breathing for the pulmonary patient.   Exercise for the Pulmonary Patient:  -Group instruction that is supported by a PowerPoint presentation.  Instructor discusses benefits of exercise, core components of exercise, frequency, duration, and intensity of an exercise routine, importance of utilizing pulse oximetry during exercise, safety while exercising, and options of places to exercise outside of rehab.     Pulmonary Medications:  -Verbally interactive group education provided by instructor with focus on inhaled medications and proper administration.   Anatomy and Physiology of the Respiratory System and Intimacy:  -Group instruction provided by PowerPoint, verbal discussion, and written material to support subject matter. Instructor reviews respiratory cycle and anatomical components of the respiratory system and their functions. Instructor also reviews differences in obstructive and restrictive respiratory diseases with examples of each. Intimacy, Sex, and Sexuality differences are reviewed with a discussion on how relationships can change when diagnosed with pulmonary disease. Common sexual concerns are reviewed.   MD DAY -A group question and answer session with a medical doctor that allows participants to ask questions that relate to their pulmonary disease state.   OTHER EDUCATION -Group or individual verbal, written, or video instructions that support the educational goals of the pulmonary rehab program.   Knowledge Questionnaire Score:   Core Components/Risk Factors/Patient Goals at Admission: Personal Goals and Risk Factors at Admission - 05/27/17 1339      Core Components/Risk Factors/Patient Goals on Admission   Improve shortness of breath with ADL's  Yes    Intervention  Provide education, individualized exercise plan and daily activity instruction to help decrease symptoms of SOB with activities of daily living.    Expected Outcomes  Short Term: Achieves a reduction of symptoms when performing activities of daily living.    Heart Failure  Yes    Intervention  Provide a combined exercise and nutrition program that  is supplemented  with education, support and counseling about heart failure. Directed toward relieving symptoms such as shortness of breath, decreased exercise tolerance, and extremity edema.    Expected Outcomes  Improve functional capacity of life;Short term: Attendance in program 2-3 days a week with increased exercise capacity. Reported lower sodium intake. Reported increased fruit and vegetable intake. Reports medication compliance.;Short term: Daily weights obtained and reported for increase. Utilizing diuretic protocols set by physician.;Long term: Adoption of self-care skills and reduction of barriers for early signs and symptoms recognition and intervention leading to self-care maintenance.       Core Components/Risk Factors/Patient Goals Review:    Core Components/Risk Factors/Patient Goals at Discharge (Final Review):    ITP Comments:   Comments:

## 2017-05-31 NOTE — Progress Notes (Signed)
Paramedicine Encounter    Patient ID: Danielle [name], female    DOB: 28-Oct-1945, 72 y.o.   MRN: [id number]   Patient Care Team: Seward Carol, MD as PCP - General (Internal Medicine)  Patient Active Problem List   Diagnosis Date Noted  . OSA (obstructive sleep apnea) 02/18/2017  . Acute combined systolic and diastolic congestive heart failure ([place]) 02/05/2017  . Essential hypertension   . Diabetes mellitus without complication ([place])   . Cancer ([place])   . Thyroid disease     Current Outpatient Medications:  .  acetaminophen (TYLENOL) 650 MG CR tablet, Take 650 mg by mouth every 8 (eight) hours as needed for pain., Disp: , Rfl:  .  allopurinol (ZYLOPRIM) 100 MG tablet, Take 100 mg by mouth daily., Disp: , Rfl:  .  Ascorbic Acid (VITAMIN C PO), Take 1 tablet by mouth daily., Disp: , Rfl:  .  aspirin 81 MG tablet, Take 1 tablet (81 mg total) by mouth daily., Disp: 30 tablet, Rfl: 3 .  atorvastatin (LIPITOR) 80 MG tablet, Take 1 tablet (80 mg total) by mouth at bedtime., Disp: 30 tablet, Rfl: 3 .  B Complex-C (SUPER B COMPLEX PO), Take 2 tablets by mouth daily., Disp: , Rfl:  .  Biotin 10 MG CAPS, Take 10 mg by mouth daily., Disp: , Rfl:  .  calcium citrate (CALCITRATE - DOSED IN MG ELEMENTAL CALCIUM) 950 MG tablet, Take 1 tablet by mouth 2 (two) times daily. , Disp: , Rfl:  .  carvedilol (COREG) 6.25 MG tablet, Take 1 tablet (6.25 mg total) by mouth 2 (two) times daily., Disp: 60 tablet, Rfl: 3 .  CHONDROITIN SULFATE PO, Take 250 mg by mouth daily., Disp: , Rfl:  .  colchicine 0.6 MG tablet, Take 0.6 mg by mouth 2 (two) times daily., Disp: , Rfl:  .  ferrous sulfate 325 (65 FE) MG tablet, Take 325 mg by mouth 2 (two) times daily with a meal., Disp: , Rfl:  .  folic acid (FOLVITE) 1 MG tablet, Take 1 mg by mouth daily., Disp: , Rfl:  .  insulin glargine (LANTUS) 100 UNIT/ML injection, Inject 30 Units into the skin 2 (two) times daily. , Disp: , Rfl:  .  insulin lispro (HUMALOG) 100 UNIT/ML  injection, Inject 5 Units into the skin daily with lunch. , Disp: , Rfl:  .  isosorbide mononitrate (IMDUR) 30 MG 24 hr tablet, Take 1 tablet (30 mg total) by mouth daily., Disp: 30 tablet, Rfl: 3 .  Lactobacillus (ACIDOPHILUS PO), Take 1 capsule by mouth daily., Disp: , Rfl:  .  levothyroxine (SYNTHROID, LEVOTHROID) 137 MCG tablet, Take 1 tablet (137 mcg total) daily before breakfast by mouth. Further refills must come from primary care doctor., Disp: 30 tablet, Rfl: 0 .  losartan (COZAAR) 25 MG tablet, Take 25 mg by mouth daily., Disp: , Rfl:  .  metFORMIN (GLUCOPHAGE) 500 MG tablet, Take 500 mg by mouth 2 (two) times daily with a meal. , Disp: , Rfl:  .  nitroGLYCERIN (NITROSTAT) 0.4 MG SL tablet, Place 1 tablet (0.4 mg total) under the tongue every 5 (five) minutes as needed for chest pain., Disp: 25 tablet, Rfl: 0 .  omeprazole (PRILOSEC) 40 MG capsule, Take 40 mg by mouth daily., Disp: , Rfl:  .  potassium chloride (K-DUR,KLOR-CON) 10 MEQ tablet, Take 20 mEq by mouth daily., Disp: , Rfl:  .  predniSONE (DELTASONE) 20 MG tablet, Take 40 mg (2 tabs) daily for three days (until bottle  empty). (Patient not taking: Reported on 05/17/2017), Disp: 6 tablet, Rfl: 0 .  spironolactone (ALDACTONE) 25 MG tablet, Take 25 mg by mouth every other day., Disp: , Rfl:  .  ticagrelor (BRILINTA) 90 MG TABS tablet, Take 1 tablet (90 mg total) by mouth 2 (two) times daily., Disp: 60 tablet, Rfl: 3 .  torsemide (DEMADEX) 20 MG tablet, Take 20 mg by mouth daily. One tab in the AM and two tabs in the PM, Disp: , Rfl:  .  torsemide (DEMADEX) 20 MG tablet, Take 2 tablets (40 mg total) by mouth 2 (two) times daily., Disp: 120 tablet, Rfl: 6 .  Travoprost, BAK Free, (TRAVATAN) 0.004 % SOLN ophthalmic solution, Place 1 drop into both eyes daily., Disp: , Rfl:  Allergies  Allergen Reactions  . Sulfa Antibiotics Rash      Social History   Socioeconomic History  . Marital status: Married    Spouse name: Not on file   . Number of children: Not on file  . Years of education: Not on file  . Highest education level: Not on file  Social Needs  . Financial resource strain: Not on file  . Food insecurity - worry: Not on file  . Food insecurity - inability: Not on file  . Transportation needs - medical: Not on file  . Transportation needs - non-medical: Not on file  Occupational History  . Not on file  Tobacco Use  . Smoking status: Former Smoker    Packs/day: 1.00    Years: 22.00    Pack years: 22.00    Types: Cigarettes    Last attempt to quit: 1983    Years since quitting: 36.0  . Smokeless tobacco: Never Used  Substance and Sexual Activity  . Alcohol use: Yes    Comment: ocassional  . Drug use: No  . Sexual activity: No    Birth control/protection: None, Post-menopausal  Other Topics Concern  . Not on file  Social History Narrative  . Not on file    Physical Exam  Constitutional: She is oriented to person, place, and time.  Cardiovascular: Normal rate and regular rhythm.  Pulmonary/Chest: Effort normal and breath sounds normal. No respiratory distress. She has no wheezes. She has no rales.  Abdominal: Soft.  Musculoskeletal: Normal range of motion. She exhibits edema.  Neurological: She is alert and oriented to person, place, and time.  Skin: Skin is warm and dry.  Psychiatric: She has a normal mood and affect.    SAFE - 05/17/17 1400      Situation   Admitting diagnosis  chf    Heart failure history  Exisiting    Comorbidities  DM;HTN    Readmitted within 30 days  No    Hospital admission within past 12 months  Yes    number of hospital admissions  1    number of ED visits  1      Assessment   Lives alone  Yes    Primary support person  Danielle [name]    Mode of transportation  personal car    Other services involved  None    Home equipement  Cane;Scale;Walker      Weight   Weighs self daily  Yes    Scale provided  No    Records on weight chart  Yes      Resources    Has "Living better w/heart failure" book  Yes    Has HF Zone tool  Yes    Able  to identify yellow zone signs/when to call MD  Yes    Records zone daily  Yes      Medications   Uses a pill box  Yes    Who stocks the pill box  Paramedic    Pill box checked this visit  Yes    Pill box refilled this visit  Yes    Difficulty obtaining medications  No    Mail order medications  No    Missed one or more doses of medications per week  No      Nutrition   Patient receives meals on wheels  No    Patient follows low sodium diet  Yes    Has foods at home that meet the current recommended diet  Yes    Patient follows low sugar/card diet  Yes    Nutritional concerns/issues  no      Activity Level   ADL's/Mobility  Independent    How many feet can patient ambulate  100    Typical activity level  active    Barriers  Knee pain      Urine   Difficulty urinating  No    Changes in urine  None      Time spent with patient   Time spent with patient   90 Minutes         Future Appointments  Date Time Provider [place]  06/06/2017 10:30 AM [facility]ER[facility]AD [facility] None  06/11/2017 10:30 AM [facility] UNDERGRAD [facility] None  06/13/2017 10:30 AM [facility] REH[facility] UNDERGRAD [facility] None  06/15/2017  8:00 PM [facility] ROOM 3 [facility]  06/18/2017 10:30 AM [facility] UNDERGRAD [facility] None  06/20/2017 10:30 AM [facility] UNDERGRAD [facility] None  06/25/2017 10:30 AM [facility] REH[facility] UNDERGRAD [facility] None  06/25/2017 11:15 AM Camnitz, Ocie Doyne, MD [facility]  06/27/2017 10:30 AM [facility] REH[facility] UNDERGRAD [facility] None  07/02/2017 10:30 AM [facility] REH[facility] UNDERGRAD [facility] None  07/04/2017 10:30 AM [facility] REH[facility] UNDERGRAD [facility] None  07/08/2017  9:30 AM [facility] PA/NP [facility] None  07/09/2017 10:30 AM [facility] REH[facility] UNDERGRAD [facility] None  07/11/2017 10:30 AM [facility] UNDERGRAD [facility] None  07/16/2017  10:30 AM [facility] UNDERGRAD [facility] None  07/18/2017 10:30 AM [facility] REH[facility] UNDERGRAD [facility] None  07/23/2017 10:30 AM [facility] RE[facility] UNDERGRAD [facility] None  07/25/2017 10:30 AM [facility] UNDERGRAD [facility] None  07/30/2017 10:30 AM [facility] UNDERGRAD [facility] None  07/30/2017 10:55 AM [facility] DEVICE REMOTES [facility]  08/01/2017 10:30 AM [facility] UNDERGRAD [facility] None  08/06/2017 10:30 AM [facility] REH[facility] UNDERGRAD [facility] None  08/08/2017 10:30 AM [facility] RE[facility] UNDERGRAD [facility] None  08/13/2017 10:30 AM [facility] RE[facility] UNDERGRAD [facility] None  08/15/2017 10:30 AM [facility] UNDERGRAD [facility] None  08/20/2017 10:30 AM [facility] UNDERGRAD [facility] None  08/22/2017 10:30 AM [facility] RE[facility] UNDERGRAD [facility] None  08/27/2017 10:30 AM [facility] REH[facility] UNDERGRAD [facility] None  08/29/2017 10:30 AM [facility] RE[facility] UNDERGRAD [facility] None  09/03/2017 10:30 AM [facility] REH[facility] UNDERGRAD [facility] None  09/05/2017 10:30 AM [facility] REH[facility] UNDERGRAD [facility] None  09/10/2017 10:30 AM [facility] UNDERGRAD [facility] None  09/12/2017 10:30 AM [facility] UNDERGRAD [facility] None  09/17/2017 10:30 AM [facility] UNDERGRAD [facility] None    There were no vitals taken for this visit.  Weight yesterday- 207 lb Last visit weight- 207 lb   Danielle [name] was seen at home today and reported feeling generally well despite some joint pain. She reported that she is going to seek an orthopedic  referral from her PCP about the joint pain. She has been compliant with her medications and she has been exercising/stretching daily. Her medications were verified and her pillbox was refilled. She is planning a trip to her daughter's in [place]. She plans to be there for approximately a month because her daughter has a major surgery and she intends to help her during the recovery process. I let her know she has a clinic  appointment during the time she is going to be gone and she said she will reschedule.   Needs: Iron in Wed-Thu Isosorbide in whole week  Ordered: Isosorbide Spironolactone  Time spent with patient: 63 minutes   [name], EMT 05/31/17  ACTION: Home visit completed Next visit planned for 1 week

## 2017-06-02 ENCOUNTER — Ambulatory Visit (INDEPENDENT_AMBULATORY_CARE_PROVIDER_SITE_OTHER): Payer: Medicare HMO

## 2017-06-02 ENCOUNTER — Encounter (HOSPITAL_COMMUNITY): Payer: Self-pay | Admitting: Emergency Medicine

## 2017-06-02 ENCOUNTER — Ambulatory Visit (HOSPITAL_COMMUNITY)
Admission: EM | Admit: 2017-06-02 | Discharge: 2017-06-02 | Disposition: A | Discharge status: Home / Self Care | Payer: Medicare HMO | Attending: Internal Medicine | Admitting: Internal Medicine

## 2017-06-02 DIAGNOSIS — M25561 Pain in right knee: Secondary | ICD-10-CM

## 2017-06-02 DIAGNOSIS — M179 Osteoarthritis of knee, unspecified: Secondary | ICD-10-CM | POA: Diagnosis not present

## 2017-06-02 MED ORDER — PREDNISONE 20 MG PO TABS: 40.0000 mg | ORAL_TABLET | Freq: Every day | ORAL | 0 refills | Status: AC

## 2017-06-02 NOTE — ED Triage Notes (Signed)
PT C/O: right knee pain onset 3 days... Reports she is need of a total knee replacement but was not able to go through b/c of heart condition.   DENIES: inj/trauma   TAKING MEDS: acetaminophen   A&O x4... NAD... Brought back on wheel chair.

## 2017-06-02 NOTE — Discharge Instructions (Signed)
Your knee pain does appear consistent with arthritis at this time. Will provide 5 days of prednisone to help with your symptoms. Ice and elevate the knee. Light activity as tolerated. Please monitor your blood sugars as prednisone may increase your blood sugar. Please call to establish with an orthopedist as you may benefit from injections as you have previously received.

## 2017-06-02 NOTE — ED Provider Notes (Signed)
[facility]    CSN: [id number] Arrival date & time: 06/02/17  1736     History   Chief Complaint Chief Complaint  Patient presents with  . Knee Pain    HPI Danielle [name] is a 72 y.o. female.   Danielle [name] presents with complaints of right knee pain which has been ongoing but worse over the past few days. Constant pain, does not improve with rest. Pain is worse with extension. 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 When she went to have her right knee replaced she "coded on the table" and it was unable to be performed. This was in [place] prior to moving here to [place]. She had been getting knee injections for her pain, has not established with an orthopedist here yet. Rates pain 10/10. It radiates up her thigh at times. Denies hip or back pain. Pain is worse to medial knee. Has been taking tylenol which has not helped. History of breast cancer, CHF, diabetes and heart disease. She is on insulin.    ROS per HPI.       Past Medical History:  Diagnosis Date  . Arthritis   . Blood dyscrasia    per pt-has small blood cells-appears as if anemic  . Cancer ([facility])    breast  . CHF (congestive heart failure) ([place])   . Complication of anesthesia    difficult to awaken from per pt  . Diabetes mellitus without complication ([place])   . Dyspnea   . Essential hypertension   . Heart disease   . Hypothyroidism   . Presence of permanent cardiac pacemaker   . Sleep apnea   . Thyroid disease    hypothyroid    Patient Active Problem List   Diagnosis Date Noted  . OSA (obstructive sleep apnea) 02/18/2017  . Acute combined systolic and diastolic congestive heart failure ([place]) 02/05/2017  . Essential hypertension   . Diabetes mellitus without complication ([place])   . Cancer ([place])   . Thyroid disease     Past Surgical History:  Procedure Laterality Date  . BREAST LUMPECTOMY WITH NEEDLE LOCALIZATION AND AXILLARY LYMPH NODE  DISSECTION  1985  . BREAST SURGERY Left    partial mastectomy  . CORONARY STENT PLACEMENT  2010, 2012,2017    2 done 2010 and 1 replaced 2012 and 2 replaced in 2017  . EYE SURGERY Bilateral    bilateral lens implant  . INSERT / REPLACE / REMOVE PACEMAKER  07/19/2014  . REPLACEMENT TOTAL KNEE Left 09/2012  . THYROIDECTOMY, PARTIAL  1978  . TONSILLECTOMY      OB History    No data available       Home Medications    Prior to Admission medications   Medication Sig Start Date End Date Taking? Authorizing Provider  acetaminophen (TYLENOL) 650 MG CR tablet Take 650 mg by mouth every 8 (eight) hours as needed for pain.   Yes [provider]  Ascorbic Acid (VITAMIN C PO) Take 1 tablet by mouth daily.   Yes [provider]  aspirin 81 MG tablet Take 1 tablet (81 mg total) by mouth daily. 02/06/17  Yes Shirley Friar, PA-C  atorvastatin (LIPITOR) 80 MG tablet Take 1 tablet (80 mg total) by mouth at bedtime. 02/06/17  Yes Shirley Friar, PA-C  B Complex-C (SUPER B COMPLEX PO) Take 2 tablets by mouth daily.   Yes [provider]  Biotin 10 MG CAPS  Take 10 mg by mouth daily.   Yes [provider]  calcium citrate (CALCITRATE - DOSED IN MG ELEMENTAL CALCIUM) 950 MG tablet Take 1 tablet by mouth 2 (two) times daily.    Yes [provider]  carvedilol (COREG) 6.25 MG tablet Take 1 tablet (6.25 mg total) by mouth 2 (two) times daily. 03/05/17  Yes Camnitz, Will Hassell Done, MD  CHONDROITIN SULFATE PO Take 250 mg by mouth daily.   Yes [provider]  colchicine 0.6 MG tablet Take 0.6 mg by mouth 2 (two) times daily.   Yes [provider]  ferrous sulfate 325 (65 FE) MG tablet Take 325 mg by mouth 2 (two) times daily with a meal.   Yes [provider]  folic acid (FOLVITE) 1 MG tablet Take 1 mg by mouth daily.   Yes [provider]  insulin glargine (LANTUS) 100 UNIT/ML injection Inject 30 Units into the skin 2  (two) times daily.    Yes [provider]  insulin lispro (HUMALOG) 100 UNIT/ML injection Inject 5 Units into the skin daily with lunch.    Yes [provider]  isosorbide mononitrate (IMDUR) 30 MG 24 hr tablet Take 1 tablet (30 mg total) by mouth daily. 05/31/17  Yes Bensimhon, Shaune Pascal, MD  levothyroxine (SYNTHROID, LEVOTHROID) 137 MCG tablet Take 1 tablet (137 mcg total) daily before breakfast by mouth. Further refills must come from primary care doctor. 04/12/17  Yes Shirley Friar, PA-C  losartan (COZAAR) 25 MG tablet Take 25 mg by mouth daily.   Yes [provider]  metFORMIN (GLUCOPHAGE) 500 MG tablet Take 500 mg by mouth 2 (two) times daily with a meal.    Yes [provider]  omeprazole (PRILOSEC) 40 MG capsule Take 40 mg by mouth daily.   Yes [provider]  potassium chloride (K-DUR,KLOR-CON) 10 MEQ tablet Take 20 mEq by mouth daily.   Yes [provider]  spironolactone (ALDACTONE) 25 MG tablet Take 25 mg by mouth every other day.   Yes [provider]  ticagrelor (BRILINTA) 90 MG TABS tablet Take 1 tablet (90 mg total) by mouth 2 (two) times daily. 05/24/17  Yes Clegg, [name] D, NP  torsemide (DEMADEX) 20 MG tablet Take 20 mg by mouth daily. One tab in the AM and two tabs in the PM   Yes [provider]  torsemide (DEMADEX) 20 MG tablet Take 2 tablets (40 mg total) by mouth 2 (two) times daily. 05/24/17  Yes Clegg, [name] D, NP  allopurinol (ZYLOPRIM) 100 MG tablet Take 100 mg by mouth daily. 04/26/17 05/31/17  [provider]  Lactobacillus (ACIDOPHILUS PO) Take 1 capsule by mouth daily.    [provider]  nitroGLYCERIN (NITROSTAT) 0.4 MG SL tablet Place 1 tablet (0.4 mg total) under the tongue every 5 (five) minutes as needed for chest pain. 03/05/17   Camnitz, Ocie Doyne, MD  predniSONE (DELTASONE) 20 MG tablet Take 2 tablets (40 mg total) by mouth daily with breakfast for 5 days. 06/02/17 06/07/17   Zigmund Gottron, NP  Travoprost, BAK Free, (TRAVATAN) 0.004 % SOLN ophthalmic solution Place 1 drop into both eyes daily.    [provider]    Family History Family History  Problem Relation Age of Onset  . Multiple myeloma Mother   . Heart disease Father   . Other Sister   . Heart disease Brother   . Diabetes Sister   . Other Sister     Social  History Social History   Tobacco Use  . Smoking status: Former Smoker    Packs/day: 1.00    Years: 22.00    Pack years: 22.00    Types: Cigarettes    Last attempt to quit: 1983    Years since quitting: 36.0  . Smokeless tobacco: Never Used  Substance Use Topics  . Alcohol use: Yes    Comment: ocassional  . Drug use: No     Allergies   Sulfa antibiotics   Review of Systems Review of Systems   Physical Exam Triage Vital Signs ED Triage Vitals [06/02/17 1807]  Enc Vitals Group     BP 125/71     Pulse Rate 87     Resp (!) 22     Temp 98.7 F (37.1 C)     Temp Source Oral     SpO2 100 %     Weight      Height      Head Circumference      Peak Flow      Pain Score 10     Pain Loc      Pain Edu?      Excl. in [place]?    No data found.  Updated Vital Signs BP 125/71 (BP Location: Right Arm)   Pulse 87   Temp 98.7 F (37.1 C) (Oral)   Resp (!) 22   SpO2 100%   Visual Acuity Right Eye Distance:   Left Eye Distance:   Bilateral Distance:    Right Eye Near:   Left Eye Near:    Bilateral Near:     Physical Exam  Constitutional: She is oriented to person, place, and time. She appears well-developed and well-nourished. No distress.  Cardiovascular: Normal rate, regular rhythm and normal heart sounds.  Pulmonary/Chest: Effort normal and breath sounds normal.  Musculoskeletal:       Right knee: She exhibits decreased range of motion and effusion. She exhibits no swelling, no ecchymosis, no deformity, no laceration, no erythema, normal alignment, no LCL laxity, normal patellar mobility, no bony  tenderness, normal meniscus and no MCL laxity. Tenderness found. Medial joint line and MCL tenderness noted. No patellar tendon tenderness noted.  Pain with passive extension; mildly limited ROM with flexion of knee but with minimal pain. Lower extremity sensation intact; strength equal bilaterally; ambulatory but with pain; mild effusion noted; without posterior knee pain, calf pain, redness or swelling   Neurological: She is alert and oriented to person, place, and time.  Skin: Skin is warm and dry.     UC Treatments / Results  Labs (all labs ordered are listed, but only abnormal results are displayed) Labs Reviewed - No data to display  EKG  EKG Interpretation None       Radiology Dg Knee Ap/lat W/sunrise Right  Result Date: 06/02/2017 CLINICAL DATA:  Right knee pain onset 3 days. Pt has had chronic knee pain prior to this. Pt states that she is in need of total knee replacement but is unable do to heart condition. Pt states that she has joint degeneration in rt knee. EXAM: RIGHT KNEE 3 VIEWS COMPARISON:  None. FINDINGS: There is significant degenerative change involving the medial, lateral, and patellofemoral compartments. No acute fracture or subluxation. No joint effusion. There is dense atherosclerosis of the popliteal artery. IMPRESSION: Significant degenerative changes. No evidence for acute abnormality. Electronically Signed   By: Nolon Nations M.D.   On: 06/02/2017 20:05    Procedures Procedures (including critical care time)  Medications Ordered in UC Medications - No data to display   Initial Impression / Assessment and Plan / UC Course  I have reviewed the triage vital signs and the nursing notes.  Pertinent labs & imaging results that were available during my care of the patient were reviewed by me and considered in my medical decision making (see chart for details).    Symptoms and imaging consistent with arthritic pain. Creatinine slightly elevated on 12/17 at  1.09. Will provide 5 days of prednisone to help with pain, follow up with orthopedics as may benefit from injection as she has been receiving in the past. Discussed to monitor blood sugars. Activity as tolerated. Patient verbalized understanding and agreeable to plan.  Ambulatory out of clinic without difficulty.     Final Clinical Impressions(s) / UC Diagnoses   Final diagnoses:  Right knee pain, unspecified chronicity    ED Discharge Orders        Ordered    predniSONE (DELTASONE) 20 MG tablet  Daily with breakfast     06/02/17 2009       Controlled Substance Prescriptions [place] Controlled Substance Registry consulted? Not Applicable   Zigmund Gottron, NP 06/02/17 2023

## 2017-06-04 ENCOUNTER — Encounter (HOSPITAL_COMMUNITY): Payer: Self-pay

## 2017-06-04 NOTE — Progress Notes (Signed)
Returned patients call. Patient states she went to urgent care on 1/6 for severe knee pain. Per EMR x-rays were taken, she was given a course of prednisone and referred to ortho. Patient states she has an appointment on Thursday 06/06/17 with her PCP which is her first scheduled day for PR. She has been instructed to not come to her PR session as she continues to have significant pain in her knee and unable to bear weight. Will follow up with patient next week and place on medical hold or discharge from PR if appropriate.

## 2017-06-05 ENCOUNTER — Ambulatory Visit: Payer: Medicare Other | Admitting: Cardiology

## 2017-06-06 ENCOUNTER — Encounter (HOSPITAL_COMMUNITY): Payer: Medicare HMO

## 2017-06-06 DIAGNOSIS — M25561 Pain in right knee: Secondary | ICD-10-CM | POA: Diagnosis not present

## 2017-06-07 ENCOUNTER — Telehealth (HOSPITAL_COMMUNITY): Payer: Self-pay

## 2017-06-07 ENCOUNTER — Other Ambulatory Visit (HOSPITAL_COMMUNITY): Payer: Self-pay

## 2017-06-07 DIAGNOSIS — M1711 Unilateral primary osteoarthritis, right knee: Secondary | ICD-10-CM | POA: Diagnosis not present

## 2017-06-07 NOTE — Telephone Encounter (Signed)
I called Danielle [name] to schedule an appointment. She said she just got home from the doctors office where she had an injection in her knee and was in a lot of pain. She stated she would like to wait until later this afternoon to see me.

## 2017-06-07 NOTE — Progress Notes (Signed)
Paramedicine Encounter    Patient ID: Danielle [name], female    DOB: [birth year], 72 y.o.   MRN: [id number]   Patient Care Team: Seward Carol, MD as PCP - General (Internal Medicine)  Patient Active Problem List   Diagnosis Date Noted  . OSA (obstructive sleep apnea) 02/18/2017  . Acute combined systolic and diastolic congestive heart failure ([place]) 02/05/2017  . Essential hypertension   . Diabetes mellitus without complication ([place])   . Cancer ([place])   . Thyroid disease     Current Outpatient Medications:  .  acetaminophen (TYLENOL) 650 MG CR tablet, Take 650 mg by mouth every 8 (eight) hours as needed for pain., Disp: , Rfl:  .  allopurinol (ZYLOPRIM) 100 MG tablet, Take 100 mg by mouth daily., Disp: , Rfl:  .  Ascorbic Acid (VITAMIN C PO), Take 1 tablet by mouth daily., Disp: , Rfl:  .  aspirin 81 MG tablet, Take 1 tablet (81 mg total) by mouth daily., Disp: 30 tablet, Rfl: 3 .  atorvastatin (LIPITOR) 80 MG tablet, Take 1 tablet (80 mg total) by mouth at bedtime., Disp: 30 tablet, Rfl: 3 .  B Complex-C (SUPER B COMPLEX PO), Take 2 tablets by mouth daily., Disp: , Rfl:  .  calcium citrate (CALCITRATE - DOSED IN MG ELEMENTAL CALCIUM) 950 MG tablet, Take 1 tablet by mouth 2 (two) times daily. , Disp: , Rfl:  .  carvedilol (COREG) 6.25 MG tablet, Take 1 tablet (6.25 mg total) by mouth 2 (two) times daily., Disp: 60 tablet, Rfl: 3 .  folic acid (FOLVITE) 1 MG tablet, Take 1 mg by mouth daily., Disp: , Rfl:  .  insulin glargine (LANTUS) 100 UNIT/ML injection, Inject 30 Units into the skin 2 (two) times daily. , Disp: , Rfl:  .  insulin lispro (HUMALOG) 100 UNIT/ML injection, Inject 5 Units into the skin daily with lunch. , Disp: , Rfl:  .  isosorbide mononitrate (IMDUR) 30 MG 24 hr tablet, Take 1 tablet (30 mg total) by mouth daily., Disp: 30 tablet, Rfl: 3 .  Lactobacillus (ACIDOPHILUS PO), Take 1 capsule by mouth daily., Disp: , Rfl:  .  levothyroxine (SYNTHROID, LEVOTHROID) 137 MCG  tablet, Take 1 tablet (137 mcg total) daily before breakfast by mouth. Further refills must come from primary care doctor., Disp: 30 tablet, Rfl: 0 .  losartan (COZAAR) 25 MG tablet, Take 25 mg by mouth daily., Disp: , Rfl:  .  metFORMIN (GLUCOPHAGE) 500 MG tablet, Take 500 mg by mouth 2 (two) times daily with a meal. , Disp: , Rfl:  .  nitroGLYCERIN (NITROSTAT) 0.4 MG SL tablet, Place 1 tablet (0.4 mg total) under the tongue every 5 (five) minutes as needed for chest pain., Disp: 25 tablet, Rfl: 0 .  potassium chloride (K-DUR,KLOR-CON) 10 MEQ tablet, Take 20 mEq by mouth daily., Disp: , Rfl:  .  predniSONE (DELTASONE) 20 MG tablet, Take 2 tablets (40 mg total) by mouth daily with breakfast for 5 days., Disp: 10 tablet, Rfl: 0 .  spironolactone (ALDACTONE) 25 MG tablet, Take 25 mg by mouth every other day., Disp: , Rfl:  .  ticagrelor (BRILINTA) 90 MG TABS tablet, Take 1 tablet (90 mg total) by mouth 2 (two) times daily., Disp: 60 tablet, Rfl: 3 .  torsemide (DEMADEX) 20 MG tablet, Take 20 mg by mouth daily. One tab in the AM and two tabs in the PM, Disp: , Rfl:  .  Travoprost, BAK Free, (TRAVATAN) 0.004 % SOLN ophthalmic solution, Place  1 drop into both eyes daily., Disp: , Rfl:  .  Biotin 10 MG CAPS, Take 10 mg by mouth daily., Disp: , Rfl:  .  CHONDROITIN SULFATE PO, Take 250 mg by mouth daily., Disp: , Rfl:  .  colchicine 0.6 MG tablet, Take 0.6 mg by mouth 2 (two) times daily., Disp: , Rfl:  .  ferrous sulfate 325 (65 FE) MG tablet, Take 325 mg by mouth 2 (two) times daily with a meal., Disp: , Rfl:  .  omeprazole (PRILOSEC) 40 MG capsule, Take 40 mg by mouth daily., Disp: , Rfl:  .  torsemide (DEMADEX) 20 MG tablet, Take 2 tablets (40 mg total) by mouth 2 (two) times daily. (Patient not taking: Reported on 06/07/2017), Disp: 120 tablet, Rfl: 6 Allergies  Allergen Reactions  . Sulfa Antibiotics Rash      Social History   Socioeconomic History  . Marital status: Married    Spouse name:  Not on file  . Number of children: Not on file  . Years of education: Not on file  . Highest education level: Not on file  Social Needs  . Financial resource strain: Not on file  . Food insecurity - worry: Not on file  . Food insecurity - inability: Not on file  . Transportation needs - medical: Not on file  . Transportation needs - non-medical: Not on file  Occupational History  . Not on file  Tobacco Use  . Smoking status: Former Smoker    Packs/day: 1.00    Years: 22.00    Pack years: 22.00    Types: Cigarettes    Last attempt to quit: 1983    Years since quitting: 36.0  . Smokeless tobacco: Never Used  Substance and Sexual Activity  . Alcohol use: Yes    Comment: ocassional  . Drug use: No  . Sexual activity: No    Birth control/protection: None, Post-menopausal  Other Topics Concern  . Not on file  Social History Narrative  . Not on file    Physical Exam  Constitutional: She is oriented to person, place, and time.  Cardiovascular: Normal rate and regular rhythm.  Pulmonary/Chest: Effort normal and breath sounds normal. No respiratory distress. She has no wheezes. She has no rales.  Abdominal: Soft.  Musculoskeletal: Normal range of motion. She exhibits edema.  Neurological: She is alert and oriented to person, place, and time.  Skin: Skin is warm and dry.  Psychiatric: She has a normal mood and affect.    SAFE - 05/17/17 1400      Situation   Admitting diagnosis  chf    Heart failure history  Exisiting    Comorbidities  DM;HTN    Readmitted within 30 days  No    Hospital admission within past 12 months  Yes    number of hospital admissions  1    number of ED visits  1      Assessment   Lives alone  Yes    Primary support person  Drucella Karbowski    Mode of transportation  personal car    Other services involved  None    Home equipement  Cane;Scale;Walker      Weight   Weighs self daily  Yes    Scale provided  No    Records on weight chart  Yes       Resources   Has "Living better w/heart failure" book  Yes    Has HF Zone tool  Yes  Able to identify yellow zone signs/when to call MD  Yes    Records zone daily  Yes      Medications   Uses a pill box  Yes    Who stocks the pill box  Paramedic    Pill box checked this visit  Yes    Pill box refilled this visit  Yes    Difficulty obtaining medications  No    Mail order medications  No    Missed one or more doses of medications per week  No      Nutrition   Patient receives meals on wheels  No    Patient follows low sodium diet  Yes    Has foods at home that meet the current recommended diet  Yes    Patient follows low sugar/card diet  Yes    Nutritional concerns/issues  no      Activity Level   ADL's/Mobility  Independent    How many feet can patient ambulate  100    Typical activity level  active    Barriers  Knee pain      Urine   Difficulty urinating  No    Changes in urine  None      Time spent with patient   Time spent with patient   90 Minutes         Future Appointments  Date Time Provider [place]  06/11/2017 10:30 AM [facility] None  06/13/2017 10:30 AM [facility] None  06/15/2017  8:00 PM [facility] ROOM 3 [facility]  06/18/2017 10:30 AM [facility] None  06/20/2017 10:30 AM [facility] None  06/25/2017 10:30 AM [facility] None  06/25/2017 11:15 AM Camnitz, Will Hassell Done, MD [facility]  06/27/2017 10:30 AM [facility] None  07/02/2017 10:30 AM [facility] None  07/04/2017 10:30 AM [facility] None  07/08/2017  9:30 AM [facility] PA/NP [facility] None  07/09/2017 10:30 AM [facility] None  07/11/2017 10:30 AM [facility] None  07/16/2017 10:30 AM [facility] None   07/18/2017 10:30 AM [facility] None  07/23/2017 10:30 AM [facility] None  07/25/2017 10:30 AM [facility] None  07/30/2017 10:30 AM [facility] None  07/30/2017 10:55 AM [facility] DEVICE REMOTES [facility]  08/01/2017 10:30 AM [facility] None  08/06/2017 10:30 AM [facility] None  08/08/2017 10:30 AM [facility] None  08/13/2017 10:30 AM [facility] None  08/15/2017 10:30 AM [facility] None  08/20/2017 10:30 AM [facility] None  08/22/2017 10:30 AM [facility] None  08/27/2017 10:30 AM [facility] None  08/29/2017 10:30 AM [facility] None  09/03/2017 10:30 AM [facility] None  09/05/2017 10:30 AM [facility] None  09/10/2017 10:30 AM [facility] None  09/12/2017 10:30 AM [facility] None  09/17/2017 10:30 AM [facility] None    BP (!) 90/50 (BP Location: Right Arm, Patient Position: Sitting, Cuff Size: Large)   Pulse 94   Resp 16   Wt 205 lb (93 kg)   SpO2 97%   BMI 36.31 kg/m   Weight yesterday- 205 lb Last visit weight- 204.8 lb  Ms Ochs was seen at  home today and reported feeling unwell. She has been feeling nauseated today due to pain medication she is taking for her knee pain. She denied SOB, headache, dizziness or orthopnea. Her weight is stable and she is taking her medications. Her medications were verified and her pillbox was refilled.   Spironolactone in Sat/Mon/Wed/Fri   Time spent with patient: 46 minutes  [name], EMT 06/07/17  ACTION: Home visit completed Next visit planned for 1  week

## 2017-06-11 ENCOUNTER — Encounter (HOSPITAL_COMMUNITY): Payer: Medicare HMO

## 2017-06-11 ENCOUNTER — Encounter (HOSPITAL_COMMUNITY): Payer: Self-pay | Admitting: *Deleted

## 2017-06-13 ENCOUNTER — Other Ambulatory Visit (HOSPITAL_COMMUNITY): Payer: Self-pay

## 2017-06-13 ENCOUNTER — Encounter (HOSPITAL_COMMUNITY): Admission: RE | Admit: 2017-06-13 | Payer: Medicare HMO | Source: Ambulatory Visit

## 2017-06-13 ENCOUNTER — Encounter (HOSPITAL_COMMUNITY): Payer: Self-pay

## 2017-06-13 NOTE — Progress Notes (Signed)
Called patient to follow up on medical hold from pulmonary rehab. Patient states she is in tremendous pain, now in her hands. She received an injection in her knee recently which was what placed her on medical hold from from pulmonary rehab. Patient request to be discharge from [facility] at this time until she is free from recurring joint pain. Has appointment with MD tomorrow. Patient will be discharged per her request. Encouraged patient to call back once she is able to physically participate in the exercise program.

## 2017-06-13 NOTE — Progress Notes (Signed)
Paramedicine Encounter    Patient ID: Danielle [name], female    DOB: 11/15/1945, 72 y.o.   MRN: [id number]   Patient Care Team: Seward Carol, MD as PCP - General (Internal Medicine)  Patient Active Problem List   Diagnosis Date Noted  . OSA (obstructive sleep apnea) 02/18/2017  . Acute combined systolic and diastolic congestive heart failure ([place]) 02/05/2017  . Essential hypertension   . Diabetes mellitus without complication ([place])   . Cancer ([place])   . Thyroid disease     Current Outpatient Medications:  .  acetaminophen (TYLENOL) 650 MG CR tablet, Take 650 mg by mouth every 8 (eight) hours as needed for pain., Disp: , Rfl:  .  allopurinol (ZYLOPRIM) 100 MG tablet, Take 100 mg by mouth daily., Disp: , Rfl:  .  Ascorbic Acid (VITAMIN C PO), Take 1 tablet by mouth daily., Disp: , Rfl:  .  aspirin 81 MG tablet, Take 1 tablet (81 mg total) by mouth daily., Disp: 30 tablet, Rfl: 3 .  atorvastatin (LIPITOR) 80 MG tablet, Take 1 tablet (80 mg total) by mouth at bedtime., Disp: 30 tablet, Rfl: 3 .  B Complex-C (SUPER B COMPLEX PO), Take 2 tablets by mouth daily., Disp: , Rfl:  .  Biotin 10 MG CAPS, Take 10 mg by mouth daily., Disp: , Rfl:  .  calcium citrate (CALCITRATE - DOSED IN MG ELEMENTAL CALCIUM) 950 MG tablet, Take 1 tablet by mouth 2 (two) times daily. , Disp: , Rfl:  .  carvedilol (COREG) 6.25 MG tablet, Take 1 tablet (6.25 mg total) by mouth 2 (two) times daily., Disp: 60 tablet, Rfl: 3 .  CHONDROITIN SULFATE PO, Take 250 mg by mouth daily., Disp: , Rfl:  .  colchicine 0.6 MG tablet, Take 0.6 mg by mouth 2 (two) times daily., Disp: , Rfl:  .  ferrous sulfate 325 (65 FE) MG tablet, Take 325 mg by mouth 2 (two) times daily with a meal., Disp: , Rfl:  .  folic acid (FOLVITE) 1 MG tablet, Take 1 mg by mouth daily., Disp: , Rfl:  .  insulin glargine (LANTUS) 100 UNIT/ML injection, Inject 30 Units into the skin 2 (two) times daily. , Disp: , Rfl:  .  insulin lispro (HUMALOG) 100 UNIT/ML  injection, Inject 5 Units into the skin daily with lunch. , Disp: , Rfl:  .  isosorbide mononitrate (IMDUR) 30 MG 24 hr tablet, Take 1 tablet (30 mg total) by mouth daily., Disp: 30 tablet, Rfl: 3 .  Lactobacillus (ACIDOPHILUS PO), Take 1 capsule by mouth daily., Disp: , Rfl:  .  levothyroxine (SYNTHROID, LEVOTHROID) 137 MCG tablet, Take 1 tablet (137 mcg total) daily before breakfast by mouth. Further refills must come from primary care doctor., Disp: 30 tablet, Rfl: 0 .  losartan (COZAAR) 25 MG tablet, Take 25 mg by mouth daily., Disp: , Rfl:  .  metFORMIN (GLUCOPHAGE) 500 MG tablet, Take 500 mg by mouth 2 (two) times daily with a meal. , Disp: , Rfl:  .  nitroGLYCERIN (NITROSTAT) 0.4 MG SL tablet, Place 1 tablet (0.4 mg total) under the tongue every 5 (five) minutes as needed for chest pain., Disp: 25 tablet, Rfl: 0 .  omeprazole (PRILOSEC) 40 MG capsule, Take 40 mg by mouth daily., Disp: , Rfl:  .  potassium chloride (K-DUR,KLOR-CON) 10 MEQ tablet, Take 20 mEq by mouth daily., Disp: , Rfl:  .  spironolactone (ALDACTONE) 25 MG tablet, Take 25 mg by mouth every other day., Disp: , Rfl:  .  ticagrelor (BRILINTA) 90 MG TABS tablet, Take 1 tablet (90 mg total) by mouth 2 (two) times daily., Disp: 60 tablet, Rfl: 3 .  torsemide (DEMADEX) 20 MG tablet, Take 20 mg by mouth daily. One tab in the AM and two tabs in the PM, Disp: , Rfl:  .  torsemide (DEMADEX) 20 MG tablet, Take 2 tablets (40 mg total) by mouth 2 (two) times daily. (Patient not taking: Reported on 06/07/2017), Disp: 120 tablet, Rfl: 6 .  Travoprost, BAK Free, (TRAVATAN) 0.004 % SOLN ophthalmic solution, Place 1 drop into both eyes daily., Disp: , Rfl:  Allergies  Allergen Reactions  . Sulfa Antibiotics Rash      Social History   Socioeconomic History  . Marital status: Married    Spouse name: Not on file  . Number of children: Not on file  . Years of education: Not on file  . Highest education level: Not on file  Social Needs   . Financial resource strain: Not on file  . Food insecurity - worry: Not on file  . Food insecurity - inability: Not on file  . Transportation needs - medical: Not on file  . Transportation needs - non-medical: Not on file  Occupational History  . Not on file  Tobacco Use  . Smoking status: Former Smoker    Packs/day: 1.00    Years: 22.00    Pack years: 22.00    Types: Cigarettes    Last attempt to quit: 1983    Years since quitting: 36.0  . Smokeless tobacco: Never Used  Substance and Sexual Activity  . Alcohol use: Yes    Comment: ocassional  . Drug use: No  . Sexual activity: No    Birth control/protection: None, Post-menopausal  Other Topics Concern  . Not on file  Social History Narrative  . Not on file    Physical Exam  SAFE - 05/17/17 1400      Situation   Admitting diagnosis  chf    Heart failure history  Exisiting    Comorbidities  DM;HTN    Readmitted within 30 days  No    Hospital admission within past 12 months  Yes    number of hospital admissions  1    number of ED visits  1      Assessment   Lives alone  Yes    Primary support person  [place] Person    Mode of transportation  personal car    Other services involved  None    Home equipement  Cane;Scale;Walker      Weight   Weighs self daily  Yes    Scale provided  No    Records on weight chart  Yes      Resources   Has "Living better w/heart failure" book  Yes    Has HF Zone tool  Yes    Able to identify yellow zone signs/when to call MD  Yes    Records zone daily  Yes      Medications   Uses a pill box  Yes    Who stocks the pill box  Paramedic    Pill box checked this visit  Yes    Pill box refilled this visit  Yes    Difficulty obtaining medications  No    Mail order medications  No    Missed one or more doses of medications per week  No      Nutrition   Patient receives meals on wheels  No    Patient follows low sodium diet  Yes    Has foods at home that meet the current  recommended diet  Yes    Patient follows low sugar/card diet  Yes    Nutritional concerns/issues  no      Activity Level   ADL's/Mobility  Independent    How many feet can patient ambulate  100    Typical activity level  active    Barriers  Knee pain      Urine   Difficulty urinating  No    Changes in urine  None      Time spent with patient   Time spent with patient   90 Minutes         Future Appointments  Date Time Provider [place]  06/15/2017  8:00 PM [facility] ROOM 3 [facility]  06/18/2017 10:30 AM [facility] None  06/20/2017 10:30 AM [facility] None  06/25/2017 10:30 AM [facility] None  06/25/2017 11:15 AM Camnitz, Ocie Doyne, MD [facility]  06/27/2017 10:30 AM [facility] None  07/02/2017 10:30 AM [facility] None  07/04/2017 10:30 AM [facility] None  07/08/2017  9:30 AM [facility] PA/NP [facility] None  07/09/2017 10:30 AM [facility] None  07/11/2017 10:30 AM [facility] None  07/16/2017 10:30 AM [facility] None  07/18/2017 10:30 AM [facility] None  07/23/2017 10:30 AM [facility] None  07/25/2017 10:30 AM [facility] None  07/30/2017 10:30 AM [facility] None  07/30/2017 10:55 AM [facility] DEVICE REMOTES [facility]  08/01/2017 10:30 AM [facility] None  08/06/2017 10:30 AM [facility] None  08/08/2017 10:30 AM [facility] None  08/13/2017 10:30 AM [facility] None  08/15/2017 10:30 AM [facility] None  08/20/2017 10:30 AM [facility] None  08/22/2017 10:30 AM  [facility] None  08/27/2017 10:30 AM [facility] None  08/29/2017 10:30 AM [facility] None  09/03/2017 10:30 AM [facility] None  09/05/2017 10:30 AM [facility] None  09/10/2017 10:30 AM [facility] None  09/12/2017 10:30 AM [facility] None  09/17/2017 10:30 AM [facility] None    There were no vitals taken for this visit.  Weight yesterday- 199 lb Last visit weight- 205 lb   I came by to see Danielle [name] today because she was complaining of severe pain in her hands and arm. The second knuckle on her left index finder and right small finger were significantly swollen and were the point of origin of her pain. She has not been able to get relief from pain medication because it makes her nauseated and tylenol is not helping her pain. She has an appointment with her PCP in the morning but I contacted the clinic to see if she could use an NSAID. Doroteo Bradford said this was fine to use for today to try and help with the pain. I was not able to fill her pillbox because she had not picked up her medications from the pharmacy. I will return tomorrow and do a complete visit.   [name], EMT 06/13/17  ACTION: Next visit planned for tomorrow.

## 2017-06-14 ENCOUNTER — Other Ambulatory Visit (HOSPITAL_COMMUNITY): Payer: Self-pay

## 2017-06-14 ENCOUNTER — Other Ambulatory Visit (HOSPITAL_COMMUNITY): Payer: Self-pay | Admitting: Pharmacist

## 2017-06-14 DIAGNOSIS — M79642 Pain in left hand: Secondary | ICD-10-CM | POA: Diagnosis not present

## 2017-06-14 DIAGNOSIS — M79641 Pain in right hand: Secondary | ICD-10-CM | POA: Diagnosis not present

## 2017-06-14 MED ORDER — ATORVASTATIN CALCIUM 80 MG PO TABS: 80.0000 mg | ORAL_TABLET | Freq: Every day | ORAL | 11 refills | Status: DC

## 2017-06-14 MED ORDER — POTASSIUM CHLORIDE CRYS ER 10 MEQ PO TBCR: 20.0000 meq | EXTENDED_RELEASE_TABLET | Freq: Every day | ORAL | 5 refills | Status: DC

## 2017-06-14 MED ORDER — SPIRONOLACTONE 25 MG PO TABS: 25.0000 mg | ORAL_TABLET | ORAL | 5 refills | Status: DC

## 2017-06-14 NOTE — Progress Notes (Signed)
Paramedicine Encounter    Patient ID: Danielle [name], female    DOB: 08-18-1945, 72 y.o.   MRN: [id number]   Patient Care Team: Seward Carol, MD as PCP - General (Internal Medicine)  Patient Active Problem List   Diagnosis Date Noted  . OSA (obstructive sleep apnea) 02/18/2017  . Acute combined systolic and diastolic congestive heart failure ([place]) 02/05/2017  . Essential hypertension   . Diabetes mellitus without complication ([place])   . Cancer ([place])   . Thyroid disease     Current Outpatient Medications:  .  acetaminophen (TYLENOL) 650 MG CR tablet, Take 650 mg by mouth every 8 (eight) hours as needed for pain., Disp: , Rfl:  .  allopurinol (ZYLOPRIM) 100 MG tablet, Take 100 mg by mouth daily., Disp: , Rfl:  .  Ascorbic Acid (VITAMIN C PO), Take 1 tablet by mouth daily., Disp: , Rfl:  .  aspirin 81 MG tablet, Take 1 tablet (81 mg total) by mouth daily., Disp: 30 tablet, Rfl: 3 .  B Complex-C (SUPER B COMPLEX PO), Take 2 tablets by mouth daily., Disp: , Rfl:  .  calcium citrate (CALCITRATE - DOSED IN MG ELEMENTAL CALCIUM) 950 MG tablet, Take 1 tablet by mouth 2 (two) times daily. , Disp: , Rfl:  .  carvedilol (COREG) 6.25 MG tablet, Take 1 tablet (6.25 mg total) by mouth 2 (two) times daily., Disp: 60 tablet, Rfl: 3 .  folic acid (FOLVITE) 1 MG tablet, Take 1 mg by mouth daily., Disp: , Rfl:  .  insulin glargine (LANTUS) 100 UNIT/ML injection, Inject 30 Units into the skin 2 (two) times daily. , Disp: , Rfl:  .  insulin lispro (HUMALOG) 100 UNIT/ML injection, Inject 5 Units into the skin daily with lunch. , Disp: , Rfl:  .  isosorbide mononitrate (IMDUR) 30 MG 24 hr tablet, Take 1 tablet (30 mg total) by mouth daily., Disp: 30 tablet, Rfl: 3 .  Lactobacillus (ACIDOPHILUS PO), Take 1 capsule by mouth daily., Disp: , Rfl:  .  levothyroxine (SYNTHROID, LEVOTHROID) 137 MCG tablet, Take 1 tablet (137 mcg total) daily before breakfast by mouth. Further refills must come from primary care  doctor., Disp: 30 tablet, Rfl: 0 .  losartan (COZAAR) 25 MG tablet, Take 25 mg by mouth daily., Disp: , Rfl:  .  metFORMIN (GLUCOPHAGE) 500 MG tablet, Take 500 mg by mouth 2 (two) times daily with a meal. , Disp: , Rfl:  .  potassium chloride (K-DUR,KLOR-CON) 10 MEQ tablet, Take 2 tablets (20 mEq total) by mouth daily., Disp: 60 tablet, Rfl: 5 .  spironolactone (ALDACTONE) 25 MG tablet, Take 1 tablet (25 mg total) by mouth every other day., Disp: 15 tablet, Rfl: 5 .  ticagrelor (BRILINTA) 90 MG TABS tablet, Take 1 tablet (90 mg total) by mouth 2 (two) times daily., Disp: 60 tablet, Rfl: 3 .  torsemide (DEMADEX) 20 MG tablet, Take 2 tablets (40 mg) in the morning and 1 tablet (20 mg) in the afternoon, Disp: , Rfl:  .  Travoprost, BAK Free, (TRAVATAN) 0.004 % SOLN ophthalmic solution, Place 1 drop into both eyes daily., Disp: , Rfl:  .  atorvastatin (LIPITOR) 80 MG tablet, Take 1 tablet (80 mg total) by mouth at bedtime., Disp: 30 tablet, Rfl: 11 .  Biotin 10 MG CAPS, Take 10 mg by mouth daily., Disp: , Rfl:  .  CHONDROITIN SULFATE PO, Take 250 mg by mouth daily., Disp: , Rfl:  .  colchicine 0.6 MG tablet, Take 0.6 mg  by mouth 2 (two) times daily., Disp: , Rfl:  .  ferrous sulfate 325 (65 FE) MG tablet, Take 325 mg by mouth 2 (two) times daily with a meal., Disp: , Rfl:  .  nitroGLYCERIN (NITROSTAT) 0.4 MG SL tablet, Place 1 tablet (0.4 mg total) under the tongue every 5 (five) minutes as needed for chest pain. (Patient not taking: Reported on 06/14/2017), Disp: 25 tablet, Rfl: 0 .  omeprazole (PRILOSEC) 40 MG capsule, Take 40 mg by mouth daily., Disp: , Rfl:  Allergies  Allergen Reactions  . Sulfa Antibiotics Rash      Social History   Socioeconomic History  . Marital status: Married    Spouse name: Not on file  . Number of children: Not on file  . Years of education: Not on file  . Highest education level: Not on file  Social Needs  . Financial resource strain: Not on file  . Food  insecurity - worry: Not on file  . Food insecurity - inability: Not on file  . Transportation needs - medical: Not on file  . Transportation needs - non-medical: Not on file  Occupational History  . Not on file  Tobacco Use  . Smoking status: Former Smoker    Packs/day: 1.00    Years: 22.00    Pack years: 22.00    Types: Cigarettes    Last attempt to quit: 1983    Years since quitting: 36.0  . Smokeless tobacco: Never Used  Substance and Sexual Activity  . Alcohol use: Yes    Comment: ocassional  . Drug use: No  . Sexual activity: No    Birth control/protection: None, Post-menopausal  Other Topics Concern  . Not on file  Social History Narrative  . Not on file    Physical Exam  Constitutional: She is oriented to person, place, and time.  Cardiovascular: Normal rate and regular rhythm.  Pulmonary/Chest: Effort normal and breath sounds normal. No respiratory distress. She has no wheezes. She has no rales.  Abdominal: Soft.  Musculoskeletal: Normal range of motion. She exhibits edema.  Neurological: She is alert and oriented to person, place, and time.  Skin: Skin is warm and dry.  Psychiatric: She has a normal mood and affect.    SAFE - 05/17/17 1400      Situation   Admitting diagnosis  chf    Heart failure history  Exisiting    Comorbidities  DM;HTN    Readmitted within 30 days  No    Hospital admission within past 12 months  Yes    number of hospital admissions  1    number of ED visits  1      Assessment   Lives alone  Yes    Primary support person  Rosabel Sermeno    Mode of transportation  personal car    Other services involved  None    Home equipement  Cane;Scale;Walker      Weight   Weighs self daily  Yes    Scale provided  No    Records on weight chart  Yes      Resources   Has "Living better w/heart failure" book  Yes    Has HF Zone tool  Yes    Able to identify yellow zone signs/when to call MD  Yes    Records zone daily  Yes      Medications    Uses a pill box  Yes    Who stocks the pill box  Paramedic    Pill box checked this visit  Yes    Pill box refilled this visit  Yes    Difficulty obtaining medications  No    Mail order medications  No    Missed one or more doses of medications per week  No      Nutrition   Patient receives meals on wheels  No    Patient follows low sodium diet  Yes    Has foods at home that meet the current recommended diet  Yes    Patient follows low sugar/card diet  Yes    Nutritional concerns/issues  no      Activity Level   ADL's/Mobility  Independent    How many feet can patient ambulate  100    Typical activity level  active    Barriers  Knee pain      Urine   Difficulty urinating  No    Changes in urine  None      Time spent with patient   Time spent with patient   90 Minutes         Future Appointments  Date Time Provider [place]  06/15/2017  8:00 PM [facility] ROOM 3 [facility]  06/18/2017 10:30 AM [facility] UNDERGRAD [facility] None  06/20/2017 10:30 AM [facility]ULMONARY REHAB UNDERGRAD [facility] None  06/25/2017 10:30 AM [facility]PULMONARY REHAB UNDERGRAD [facility] None  06/25/2017 11:15 AM Camnitz, Will Hassell Done, MD [facility]  06/27/2017 10:30 AM [facility]PULMONARY REHAB UNDERGRAD [facility]RE[facility] None  07/02/2017 10:30 AM [facility]-PULMONARY REHAB UNDERGRAD [facility] None  07/04/2017 10:30 AM [facility]PULMONARY REHAB UNDERGRAD [facility] None  07/08/2017  9:30 AM [facility] PA/NP [facility] None  07/09/2017 10:30 AM [facility]-PULMONARY REHAB UNDERGRAD [facility] None  07/11/2017 10:30 AM [facility]-PULMONARY REHAB UNDERGRAD [facility] None  07/16/2017 10:30 AM [facility]PULMONARY REHAB UNDERGRAD [facility] None  07/18/2017 10:30 AM [facility]-PULMONARY REHAB UNDERGRAD [facility] None  07/23/2017 10:30 AM [facility]-PULMONARY REHAB UNDERGRAD [facility] None  07/25/2017 10:30 AM [facility]-PULMONARY REHAB UNDERGRAD [facility] None  07/30/2017 10:30 AM [facility]PULMONARY REHAB UNDERGRAD [facility] None  07/30/2017 10:55 AM [facility] DEVICE REMOTES [facility]  [facility]  08/01/2017 10:30 AM [facility]-PULMONARY REHAB UNDERGRAD [facility] None  08/06/2017 10:30 AM [facility]-PULMONARY REHAB UNDERGRAD [facility] None  08/08/2017 10:30 AM [facility]PULMONARY REHAB UNDERGRAD [facility] None  08/13/2017 10:30 AM [facility]PULMONARY REHAB UNDERGRAD [facility] None  08/15/2017 10:30 AM [facility]PULMONARY REHAB UNDERGRAD [facility] None  08/20/2017 10:30 AM [facility]PULMONARY REHAB UNDERGRAD [facility] None  08/22/2017 10:30 AM [facility]PULMONARY REHAB UNDERGRAD [facility] None  08/27/2017 10:30 AM [facility]-PULMONARY REHAB UNDERGRAD [facility] None  08/29/2017 10:30 AM [facility]PULMONARY REHAB UNDERGRAD [facility] None  09/03/2017 10:30 AM [facility]-PULMONARY REHAB UNDERGRAD [facility] None  09/05/2017 10:30 AM [facility]PULMONARY REHAB UNDERGRAD [facility] None  09/10/2017 10:30 AM [facility]-PULMONARY REHAB UNDERGRAD [facility] None  09/12/2017 10:30 AM [facility]-PULMONARY REHAB UNDERGRAD [facility] None  09/17/2017 10:30 AM MC-PULMONARY REHAB UNDERGRAD [facility] None    BP (!) 90/52 (BP Location: Right Arm, Patient Position: Sitting, Cuff Size: Large)   Pulse 88   Resp 16   Wt 199 lb (90.3 kg)   SpO2 95%   BMI 35.25 kg/m   Weight yesterday- 203 lb Last visit weight- 203 lb  Ms Diggs was seen at home today and reported feeling well despite her gout flare. She stated she has been taking her medications but had not picked up the OTC medicine since last week. I verified her medications and refilled her pillbox. Levothyroxine and atorvastatin were ordered. But she had enough of each to fill her pillbox.   Time spent with patient: 48 minutes  [name], EMT 06/14/17  ACTION: Home visit completed Next visit planned for 1 week

## 2017-06-15 ENCOUNTER — Ambulatory Visit (HOSPITAL_BASED_OUTPATIENT_CLINIC_OR_DEPARTMENT_OTHER): Payer: Medicare HMO | Attending: Cardiology | Admitting: Cardiology

## 2017-06-15 VITALS — Ht 64.0 in | Wt 212.0 lb

## 2017-06-15 DIAGNOSIS — G4733 Obstructive sleep apnea (adult) (pediatric): Secondary | ICD-10-CM | POA: Insufficient documentation

## 2017-06-17 NOTE — Procedures (Signed)
   NAME: Danielle [name] DATE OF BIRTH:  [birth year] MEDICAL RECORD NUMBER [id number]  LOCATION: [facility]  PHYSICIAN: Yazmeen Woolf  DATE OF STUDY: 06/15/2017  SLEEP STUDY TYPE: Positive Airway Pressure Titration  CLINICAL INFORMATION The patient is referred for a CPAP titration to treat sleep apnea.  SLEEP STUDY TECHNIQUE As per the AASM Manual for the Scoring of Sleep and Associated Events v2.3 (April 2016) with a hypopnea requiring 4% desaturations.  The channels recorded and monitored were frontal, central and occipital EEG, electrooculogram (EOG), submentalis EMG (chin), nasal and oral airflow, thoracic and abdominal wall motion, anterior tibialis EMG, snore microphone, electrocardiogram, and pulse oximetry. Continuous positive airway pressure (CPAP) was initiated at the beginning of the study and titrated to treat sleep-disordered breathing.  MEDICATIONS Medications self-administered by patient taken the night of the study : LIPITOR, LANTUS, [place], [place], [place] Comments added by technician: Patient was restless all through the night. Comments added by scorer: N/A  RESPIRATORY PARAMETERS Optimal PAP Pressure (cm):9  AHI at Optimal Pressure (/hr):N/A Overall Minimal O2 (%):90.00 Supine % at Optimal Pressure (%):0 Minimal O2 at Optimal Pressure (%): 86.00   SLEEP ARCHITECTURE The study was initiated at 11:12:28 PM and ended at 5:12:24 AM.  Sleep onset time was 32.8 minutes and the sleep efficiency was 44.3%. The total sleep time was 159.5 minutes.  The patient spent 10.66% of the night in stage N1 sleep, 37.30% in stage N2 sleep, 35.42% in stage N3 and 16.61% in REM.Stage REM latency was 148.5 minutes  Wake after sleep onset was 167.6. Alpha intrusion was absent. Supine sleep was 0.94%.  CARDIAC DATA The 2 lead EKG demonstrated NSR The mean heart rate was 70.61 beats per minute. Other EKG findings include: PVCs and  PACs.  LEG MOVEMENT DATA The total Periodic Limb Movements of Sleep (PLMS) were 0. The PLMS index was 0.00. A PLMS index of <15 is considered normal in adults.  IMPRESSIONS - The optimal PAP pressure was 9 cm of water. - Central sleep apnea was not noted during this titration (CAI = 1.9/h). - Moderate oxygen desaturations were observed during this titration (min O2 = 90.00%). - The patient snored with soft snoring volume during this titration study. - 2-lead EKG demonstrated: PVCs and PACs - Clinically significant periodic limb movements were not noted during this study. Arousals associated with PLMs were rare.  DIAGNOSIS - Obstructive Sleep Apnea (327.23 [G47.33 ICD-10])  RECOMMENDATIONS - Trial of CPAP therapy on 9 cm H2O with a Large size Philips Respironics Nasal Pillow Mask Nuance Gel mask and heated humidification. - Avoid alcohol, sedatives and other CNS depressants that may worsen sleep apnea and disrupt normal sleep architecture. - Sleep hygiene should be reviewed to assess factors that may improve sleep quality. - Weight management and regular exercise should be initiated or continued. - Return to Sleep Center for re-evaluation after 10 weeks of therapy               REFERRING PHYSICIAN: Sueanne Margarita, MD  [place], American Board of Sleep Medicine  ELECTRONICALLY SIGNED ON:  06/17/2017, 5:09 PM [place] PH: [phone]   FX: [phone] [place]

## 2017-06-18 ENCOUNTER — Encounter (HOSPITAL_COMMUNITY): Admission: RE | Admit: 2017-06-18 | Payer: Medicare HMO | Source: Ambulatory Visit

## 2017-06-18 ENCOUNTER — Telehealth (HOSPITAL_COMMUNITY): Payer: Self-pay

## 2017-06-18 NOTE — Telephone Encounter (Signed)
I called Danielle [name] to check on her after a weekend of being in severe gout pain. She stated her hands were feeling much better and advised that she had arrived in [place] this morning. She plans to be there for about a month and said she would call if she needed anything while she was gone.

## 2017-06-20 ENCOUNTER — Encounter (HOSPITAL_COMMUNITY): Payer: Medicare HMO

## 2017-06-25 ENCOUNTER — Encounter (HOSPITAL_COMMUNITY): Payer: Medicare HMO

## 2017-06-25 ENCOUNTER — Encounter: Payer: Medicare Other | Admitting: Cardiology

## 2017-06-27 ENCOUNTER — Encounter (HOSPITAL_COMMUNITY): Payer: Medicare HMO

## 2017-07-02 ENCOUNTER — Encounter (HOSPITAL_COMMUNITY): Payer: Medicare Other

## 2017-07-04 ENCOUNTER — Encounter (HOSPITAL_COMMUNITY): Payer: Medicare Other

## 2017-07-08 ENCOUNTER — Encounter (HOSPITAL_COMMUNITY): Payer: Medicare Other

## 2017-07-09 ENCOUNTER — Encounter (HOSPITAL_COMMUNITY): Payer: Medicare Other

## 2017-07-11 ENCOUNTER — Encounter (HOSPITAL_COMMUNITY): Payer: Medicare Other

## 2017-07-12 ENCOUNTER — Telehealth: Payer: Self-pay | Admitting: *Deleted

## 2017-07-12 NOTE — Telephone Encounter (Signed)
-----   Message from Sueanne Margarita, MD sent at 06/17/2017  5:13 PM EST ----- Please let patient know that they had a successful PAP titration and let DME know that orders are in EPIC.  Please set up 10 week OV with me.

## 2017-07-12 NOTE — Telephone Encounter (Signed)
Informed patient of sleep study results and patient understanding was verbalized. Patient understands that she had a successful PAP titration and will be set up with a CPAP.  Patient understands she will be contacted by [facility] to set up her cpap. She understands to call if [facility] does not contact her with new setup in a timely manner. She understands she will be called once confirmation has been received from [facility] that she has received her new machine to schedule 10 week follow up appointment.  [facility] notified of new cpap order  Please add to airview She was grateful for the call and thanked me.

## 2017-07-16 ENCOUNTER — Encounter (HOSPITAL_COMMUNITY): Payer: Medicare Other

## 2017-07-16 ENCOUNTER — Telehealth (HOSPITAL_COMMUNITY): Payer: Self-pay

## 2017-07-16 NOTE — Telephone Encounter (Signed)
I called Danielle [name] to see if she was home from [place]. She was not back yet so I told her to call me when she gets home. She anticipated the beginning of March.

## 2017-07-18 ENCOUNTER — Encounter (HOSPITAL_COMMUNITY): Payer: Medicare Other

## 2017-07-23 ENCOUNTER — Encounter (HOSPITAL_COMMUNITY): Payer: Medicare Other

## 2017-07-25 ENCOUNTER — Encounter (HOSPITAL_COMMUNITY): Payer: Medicare Other

## 2017-07-26 DIAGNOSIS — E785 Hyperlipidemia, unspecified: Secondary | ICD-10-CM | POA: Diagnosis not present

## 2017-07-26 DIAGNOSIS — I110 Hypertensive heart disease with heart failure: Secondary | ICD-10-CM | POA: Diagnosis not present

## 2017-07-26 DIAGNOSIS — Z950 Presence of cardiac pacemaker: Secondary | ICD-10-CM | POA: Diagnosis not present

## 2017-07-26 DIAGNOSIS — S29019A Strain of muscle and tendon of unspecified wall of thorax, initial encounter: Secondary | ICD-10-CM | POA: Diagnosis not present

## 2017-07-26 DIAGNOSIS — E119 Type 2 diabetes mellitus without complications: Secondary | ICD-10-CM | POA: Diagnosis not present

## 2017-07-26 DIAGNOSIS — E039 Hypothyroidism, unspecified: Secondary | ICD-10-CM | POA: Diagnosis not present

## 2017-07-26 DIAGNOSIS — Z955 Presence of coronary angioplasty implant and graft: Secondary | ICD-10-CM | POA: Diagnosis not present

## 2017-07-26 DIAGNOSIS — I509 Heart failure, unspecified: Secondary | ICD-10-CM | POA: Diagnosis not present

## 2017-07-26 DIAGNOSIS — I2510 Atherosclerotic heart disease of native coronary artery without angina pectoris: Secondary | ICD-10-CM | POA: Diagnosis not present

## 2017-07-26 DIAGNOSIS — M546 Pain in thoracic spine: Secondary | ICD-10-CM | POA: Diagnosis not present

## 2017-07-26 DIAGNOSIS — M5134 Other intervertebral disc degeneration, thoracic region: Secondary | ICD-10-CM | POA: Diagnosis not present

## 2017-07-26 DIAGNOSIS — Z853 Personal history of malignant neoplasm of breast: Secondary | ICD-10-CM | POA: Diagnosis not present

## 2017-07-30 ENCOUNTER — Encounter (HOSPITAL_COMMUNITY): Payer: Medicare Other

## 2017-07-31 ENCOUNTER — Telehealth (HOSPITAL_COMMUNITY): Payer: Self-pay

## 2017-07-31 ENCOUNTER — Encounter: Payer: Medicare Other | Admitting: Cardiology

## 2017-07-31 ENCOUNTER — Telehealth (HOSPITAL_COMMUNITY): Payer: Self-pay | Admitting: Surgery

## 2017-07-31 DIAGNOSIS — M545 Low back pain: Secondary | ICD-10-CM | POA: Diagnosis not present

## 2017-07-31 DIAGNOSIS — M791 Myalgia, unspecified site: Secondary | ICD-10-CM | POA: Diagnosis not present

## 2017-07-31 DIAGNOSIS — E119 Type 2 diabetes mellitus without complications: Secondary | ICD-10-CM | POA: Diagnosis not present

## 2017-07-31 DIAGNOSIS — R5383 Other fatigue: Secondary | ICD-10-CM | POA: Diagnosis not present

## 2017-07-31 DIAGNOSIS — I10 Essential (primary) hypertension: Secondary | ICD-10-CM | POA: Diagnosis not present

## 2017-07-31 DIAGNOSIS — E785 Hyperlipidemia, unspecified: Secondary | ICD-10-CM | POA: Diagnosis not present

## 2017-07-31 DIAGNOSIS — R7989 Other specified abnormal findings of blood chemistry: Secondary | ICD-10-CM | POA: Diagnosis not present

## 2017-07-31 DIAGNOSIS — K219 Gastro-esophageal reflux disease without esophagitis: Secondary | ICD-10-CM | POA: Diagnosis not present

## 2017-07-31 NOTE — Telephone Encounter (Signed)
Patient remains out of town indefinitely visiting family.  She will be removed from the [place] secondary to inability to contact her and schedule home visits.

## 2017-07-31 NOTE — Telephone Encounter (Signed)
I called Danielle [name] to schedule an appointment. She had told me she was going be home from [place] on March 4th but she is still there. She was complaining of "muscle spasms on the right side" of her body and said she had been to the hospital in [place] for the same. She said she has been unable to get home because plane tickets are more expensive than she thought they would be so she is waiting for prices to come back down. I told her to let me know when she returns to [facility] and she agreed. I will speak to Daphne at the clinic about options regarding her situation.

## 2017-08-01 ENCOUNTER — Encounter (HOSPITAL_COMMUNITY): Payer: Medicare Other

## 2017-08-06 ENCOUNTER — Encounter (HOSPITAL_COMMUNITY): Payer: Medicare Other

## 2017-08-06 DIAGNOSIS — Z8639 Personal history of other endocrine, nutritional and metabolic disease: Secondary | ICD-10-CM | POA: Diagnosis not present

## 2017-08-06 DIAGNOSIS — Z853 Personal history of malignant neoplasm of breast: Secondary | ICD-10-CM | POA: Diagnosis not present

## 2017-08-06 DIAGNOSIS — I10 Essential (primary) hypertension: Secondary | ICD-10-CM | POA: Diagnosis not present

## 2017-08-06 DIAGNOSIS — I2510 Atherosclerotic heart disease of native coronary artery without angina pectoris: Secondary | ICD-10-CM | POA: Diagnosis not present

## 2017-08-06 DIAGNOSIS — D509 Iron deficiency anemia, unspecified: Secondary | ICD-10-CM | POA: Diagnosis not present

## 2017-08-06 DIAGNOSIS — M47817 Spondylosis without myelopathy or radiculopathy, lumbosacral region: Secondary | ICD-10-CM | POA: Diagnosis not present

## 2017-08-06 DIAGNOSIS — M1009 Idiopathic gout, multiple sites: Secondary | ICD-10-CM | POA: Diagnosis not present

## 2017-08-06 DIAGNOSIS — E669 Obesity, unspecified: Secondary | ICD-10-CM | POA: Diagnosis not present

## 2017-08-06 DIAGNOSIS — E119 Type 2 diabetes mellitus without complications: Secondary | ICD-10-CM | POA: Diagnosis not present

## 2017-08-06 DIAGNOSIS — D563 Thalassemia minor: Secondary | ICD-10-CM | POA: Diagnosis not present

## 2017-08-06 DIAGNOSIS — M47814 Spondylosis without myelopathy or radiculopathy, thoracic region: Secondary | ICD-10-CM | POA: Diagnosis not present

## 2017-08-08 ENCOUNTER — Encounter (HOSPITAL_COMMUNITY): Payer: Medicare Other

## 2017-08-13 ENCOUNTER — Encounter (HOSPITAL_COMMUNITY): Payer: Medicare Other

## 2017-08-15 ENCOUNTER — Encounter (HOSPITAL_COMMUNITY): Payer: Medicare Other

## 2017-08-15 NOTE — Telephone Encounter (Signed)
Patient called to say she has been out of town for awhile and was ready to be set up with her cpap. I have faxed her referral over to [facility] today.

## 2017-08-16 DIAGNOSIS — G4730 Sleep apnea, unspecified: Secondary | ICD-10-CM | POA: Diagnosis not present

## 2017-08-16 DIAGNOSIS — E039 Hypothyroidism, unspecified: Secondary | ICD-10-CM | POA: Diagnosis not present

## 2017-08-16 DIAGNOSIS — D509 Iron deficiency anemia, unspecified: Secondary | ICD-10-CM | POA: Diagnosis not present

## 2017-08-16 DIAGNOSIS — I5022 Chronic systolic (congestive) heart failure: Secondary | ICD-10-CM | POA: Diagnosis not present

## 2017-08-16 DIAGNOSIS — R600 Localized edema: Secondary | ICD-10-CM | POA: Diagnosis not present

## 2017-08-16 DIAGNOSIS — M62838 Other muscle spasm: Secondary | ICD-10-CM | POA: Diagnosis not present

## 2017-08-16 DIAGNOSIS — M109 Gout, unspecified: Secondary | ICD-10-CM | POA: Diagnosis not present

## 2017-08-19 DIAGNOSIS — M79642 Pain in left hand: Secondary | ICD-10-CM | POA: Diagnosis not present

## 2017-08-19 DIAGNOSIS — M2550 Pain in unspecified joint: Secondary | ICD-10-CM | POA: Diagnosis not present

## 2017-08-19 DIAGNOSIS — E669 Obesity, unspecified: Secondary | ICD-10-CM | POA: Diagnosis not present

## 2017-08-19 DIAGNOSIS — M79641 Pain in right hand: Secondary | ICD-10-CM | POA: Diagnosis not present

## 2017-08-19 DIAGNOSIS — Z6834 Body mass index (BMI) 34.0-34.9, adult: Secondary | ICD-10-CM | POA: Diagnosis not present

## 2017-08-20 ENCOUNTER — Encounter: Payer: Self-pay | Admitting: Cardiology

## 2017-08-20 ENCOUNTER — Ambulatory Visit (INDEPENDENT_AMBULATORY_CARE_PROVIDER_SITE_OTHER): Payer: Medicare HMO | Admitting: *Deleted

## 2017-08-20 ENCOUNTER — Encounter (HOSPITAL_COMMUNITY): Payer: Medicare Other

## 2017-08-20 ENCOUNTER — Telehealth: Payer: Self-pay | Admitting: Cardiology

## 2017-08-20 DIAGNOSIS — I495 Sick sinus syndrome: Secondary | ICD-10-CM | POA: Diagnosis not present

## 2017-08-20 NOTE — Telephone Encounter (Signed)
Patient has a 10 week follow up appointment scheduled for November 11 2017. Patient understands she needs to keep this appointment for insurance compliance. Patient was grateful for the call and thanked me.

## 2017-08-20 NOTE — Telephone Encounter (Signed)
Spoke with pt and reminded pt of remote transmission that is due today. Pt verbalized understanding.   

## 2017-08-20 NOTE — Progress Notes (Signed)
Remote pacemaker transmission.   

## 2017-08-20 NOTE — Telephone Encounter (Signed)
Ivin Booty at [facility] says she has not been able to reach patient I called patient and gave her [facility] number to reach sharon. Patient will call [facility] today.

## 2017-08-21 NOTE — Progress Notes (Signed)
Advanced Heart Failure Clinic Note    Primary Care: Dr. Delfina Redwood Primary Cardiologist: Dr. Haroldine Laws   HPI:  Danielle [name] is a 72 year old female with a past medical history of systolic CHF (EF 50-03%), CAD, ischemic cardiomyopathy, HTN, DM, hypothyroidism, and OSA sleep study 03/2017.   Admitted 02/03/17-02/06/17 with SOB, acute on chronic systolic CHF. Diuresed with IV lasix (12 pounds), discharge weight was 215 pounds.   Established with Dr. Curt Bears last month for [facility] management.   Today she returns for HF follow up.Overall feeling fine. Recently had gout flare. Denies SOB/PND/Orthopnea. Appetite ok. No fever or chills. Weight at home 197 pounds. Taking all medications. Followed by Paramedicine.   ECHO 01/2017- EF 45-50% Grade IIDD Left Atrium Severely dilated.   Labs from [facility] 08/01/2017 Hgb 8.9 Cholesterol 89 HDL 38 TG 91 K 4.8 Creatinine 1.0   Review of systems complete and found to be negative unless listed in HPI.    Past Medical History:  Diagnosis Date  . Arthritis   . Blood dyscrasia    per pt-has small blood cells-appears as if anemic  . Cancer (HCC)    breast  . CHF (congestive heart failure) ([place])   . Complication of anesthesia    difficult to awaken from per pt  . Diabetes mellitus without complication ([place])   . Dyspnea   . Essential hypertension   . Heart disease   . Hypothyroidism   . Presence of permanent cardiac pacemaker   . Sleep apnea   . Thyroid disease    hypothyroid    Current Outpatient Medications  Medication Sig Dispense Refill  . acetaminophen (TYLENOL) 650 MG CR tablet Take 650 mg by mouth every 8 (eight) hours as needed for pain.    Marland Kitchen allopurinol (ZYLOPRIM) 100 MG tablet Take 100 mg by mouth daily.    . Ascorbic Acid (VITAMIN C PO) Take 1 tablet by mouth daily.    Marland Kitchen aspirin 81 MG tablet Take 1 tablet (81 mg total) by mouth daily. 30 tablet 3  . atorvastatin (LIPITOR) 80 MG tablet Take 1 tablet (80 mg total) by mouth at bedtime. 30 tablet 11  . B  Complex-C (SUPER B COMPLEX PO) Take 2 tablets by mouth daily.    . Biotin 10 MG CAPS Take 10 mg by mouth daily.    . calcium citrate (CALCITRATE - DOSED IN MG ELEMENTAL CALCIUM) 950 MG tablet Take 1 tablet by mouth 2 (two) times daily.     . carvedilol (COREG) 6.25 MG tablet Take 1 tablet (6.25 mg total) by mouth 2 (two) times daily. 60 tablet 3  . CHONDROITIN SULFATE PO Take 250 mg by mouth daily.    . colchicine 0.6 MG tablet Take 0.6 mg by mouth 2 (two) times daily.    . ferrous sulfate 325 (65 FE) MG tablet Take 325 mg by mouth 2 (two) times daily with a meal.    . folic acid (FOLVITE) 1 MG tablet Take 1 mg by mouth daily.    Marland Kitchen ibuprofen (ADVIL,MOTRIN) 800 MG tablet Take 800 mg by mouth every 6 (six) hours as needed for mild pain (gout/arthritis pain).    . insulin glargine (LANTUS) 100 UNIT/ML injection Inject 30 Units into the skin 2 (two) times daily.     . insulin lispro (HUMALOG) 100 UNIT/ML injection Inject 5 Units into the skin daily with lunch.     . isosorbide mononitrate (IMDUR) 30 MG 24 hr tablet Take 1 tablet (30 mg total) by mouth  daily. 30 tablet 3  . Lactobacillus (ACIDOPHILUS PO) Take 1 capsule by mouth daily.    Marland Kitchen levothyroxine (SYNTHROID, LEVOTHROID) 137 MCG tablet Take 1 tablet (137 mcg total) daily before breakfast by mouth. Further refills must come from primary care doctor. 30 tablet 0  . losartan (COZAAR) 25 MG tablet Take 25 mg by mouth daily.    . metFORMIN (GLUCOPHAGE) 500 MG tablet Take 500 mg by mouth 2 (two) times daily with a meal.     . nitroGLYCERIN (NITROSTAT) 0.4 MG SL tablet Place 1 tablet (0.4 mg total) under the tongue every 5 (five) minutes as needed for chest pain. 25 tablet 0  . omeprazole (PRILOSEC) 40 MG capsule Take 40 mg by mouth daily.    . potassium chloride (K-DUR,KLOR-CON) 10 MEQ tablet Take 2 tablets (20 mEq total) by mouth daily. 60 tablet 5  . spironolactone (ALDACTONE) 25 MG tablet Take 1 tablet (25 mg total) by mouth every other day. 15  tablet 5  . ticagrelor (BRILINTA) 90 MG TABS tablet Take 1 tablet (90 mg total) by mouth 2 (two) times daily. 60 tablet 3  . torsemide (DEMADEX) 20 MG tablet Take 20 mg by mouth 2 (two) times daily.     . Travoprost, BAK Free, (TRAVATAN) 0.004 % SOLN ophthalmic solution Place 1 drop into both eyes daily.     No current facility-administered medications for this encounter.    Allergies  Allergen Reactions  . Sulfa Antibiotics Rash   Social History   Socioeconomic History  . Marital status: Married    Spouse name: Not on file  . Number of children: Not on file  . Years of education: Not on file  . Highest education level: Not on file  Occupational History  . Not on file  Social Needs  . Financial resource strain: Not on file  . Food insecurity:    Worry: Not on file    Inability: Not on file  . Transportation needs:    Medical: Not on file    Non-medical: Not on file  Tobacco Use  . Smoking status: Former Smoker    Packs/day: 1.00    Years: 22.00    Pack years: 22.00    Types: Cigarettes    Last attempt to quit: 1983    Years since quitting: 36.2  . Smokeless tobacco: Never Used  Substance and Sexual Activity  . Alcohol use: Yes    Comment: ocassional  . Drug use: No  . Sexual activity: Never    Birth control/protection: None, Post-menopausal  Lifestyle  . Physical activity:    Days per week: Not on file    Minutes per session: Not on file  . Stress: Not on file  Relationships  . Social connections:    Talks on phone: Not on file    Gets together: Not on file    Attends religious service: Not on file    Active member of club or organization: Not on file    Attends meetings of clubs or organizations: Not on file    Relationship status: Not on file  . Intimate partner violence:    Fear of current or ex partner: Not on file    Emotionally abused: Not on file    Physically abused: Not on file    Forced sexual activity: Not on file  Other Topics Concern  . Not  on file  Social History Narrative  . Not on file   Family History  Problem Relation Age of  Onset  . Multiple myeloma Mother   . Heart disease Father   . Other Sister   . Heart disease Brother   . Diabetes Sister   . Other Sister    Vitals:   08/22/17 0814  BP: 118/76  Pulse: 82  SpO2: 98%  Weight: 203 lb (92.1 kg)   Wt Readings from Last 3 Encounters:  08/22/17 203 lb (92.1 kg)  06/15/17 212 lb (96.2 kg)  06/14/17 199 lb (90.3 kg)      PHYSICAL EXAM: General:  Well appearing. No resp difficulty HEENT: normal Neck: supple. JVP ~10 . Carotids 2+ bilat; no bruits. No lymphadenopathy or thryomegaly appreciated. Cor: PMI nondisplaced. Regular rate & rhythm. No rubs, gallops or murmurs. Lungs: clear Abdomen: soft, nontender, nondistended. No hepatosplenomegaly. No bruits or masses. Good bowel sounds. Extremities: no cyanosis, clubbing, rash, edema Neuro: alert & orientedx3, cranial nerves grossly intact. moves all 4 extremities w/o difficulty. Affect pleasant  ASSESSMENT & PLAN:  1. Chronic systolic CHF: ICM, Echo 2/54/27 EF 45-50%, LA severely dilated, restrictive CM pattern.  - NYHAII-III . Volume status elevated. Continue current diuretics with an extra torsemide today and tomorrow.   - Continue imdur 30 mg daily - Continue losartan 25 mg daily. - Continue 25 mg spironolactone daily.  - Continue Coreg 6.25 mg twice a day.     2. CAD: Stenting in 2010, 2012 and 2016. Records pending from [place]. -No s/s ischemia.  - Continue ASA and statin.   3. HTN - Stable.     4. DM - Follows with Dr. Delfina Redwood. Sees in several weeks.   5. PPM - Dr. Curt Bears now following.   6. OSA -Had Sleep study. CPAP start pending.   7. Gout - Treated per PCP.   8. Arthritis - Continue tylenol. Further per PCP.   9. OSA-  Starting CPAP   Follow up in 6 months with Dr Aundra Dubin.    Darrick Grinder, NP 08/22/17

## 2017-08-22 ENCOUNTER — Encounter (HOSPITAL_COMMUNITY): Payer: Medicare HMO

## 2017-08-22 ENCOUNTER — Other Ambulatory Visit (HOSPITAL_COMMUNITY): Payer: Self-pay

## 2017-08-22 ENCOUNTER — Encounter (HOSPITAL_COMMUNITY): Payer: Medicare Other

## 2017-08-22 ENCOUNTER — Encounter (HOSPITAL_COMMUNITY): Payer: Self-pay

## 2017-08-22 ENCOUNTER — Ambulatory Visit (HOSPITAL_COMMUNITY)
Admission: RE | Admit: 2017-08-22 | Discharge: 2017-08-22 | Disposition: A | Discharge status: Home / Self Care | Payer: Medicare HMO | Source: Ambulatory Visit | Attending: Cardiology | Admitting: Cardiology

## 2017-08-22 VITALS — BP 118/76 | HR 82 | Wt 203.0 lb

## 2017-08-22 DIAGNOSIS — I5042 Chronic combined systolic (congestive) and diastolic (congestive) heart failure: Secondary | ICD-10-CM | POA: Diagnosis not present

## 2017-08-22 DIAGNOSIS — E119 Type 2 diabetes mellitus without complications: Secondary | ICD-10-CM | POA: Insufficient documentation

## 2017-08-22 DIAGNOSIS — G4733 Obstructive sleep apnea (adult) (pediatric): Secondary | ICD-10-CM | POA: Diagnosis not present

## 2017-08-22 DIAGNOSIS — Z950 Presence of cardiac pacemaker: Secondary | ICD-10-CM | POA: Diagnosis not present

## 2017-08-22 DIAGNOSIS — M1990 Unspecified osteoarthritis, unspecified site: Secondary | ICD-10-CM | POA: Diagnosis not present

## 2017-08-22 DIAGNOSIS — Z955 Presence of coronary angioplasty implant and graft: Secondary | ICD-10-CM | POA: Insufficient documentation

## 2017-08-22 DIAGNOSIS — I110 Hypertensive heart disease with heart failure: Secondary | ICD-10-CM | POA: Diagnosis not present

## 2017-08-22 DIAGNOSIS — E039 Hypothyroidism, unspecified: Secondary | ICD-10-CM | POA: Insufficient documentation

## 2017-08-22 DIAGNOSIS — Z882 Allergy status to sulfonamides status: Secondary | ICD-10-CM | POA: Diagnosis not present

## 2017-08-22 DIAGNOSIS — I5022 Chronic systolic (congestive) heart failure: Secondary | ICD-10-CM

## 2017-08-22 DIAGNOSIS — Z807 Family history of other malignant neoplasms of lymphoid, hematopoietic and related tissues: Secondary | ICD-10-CM | POA: Insufficient documentation

## 2017-08-22 DIAGNOSIS — Z794 Long term (current) use of insulin: Secondary | ICD-10-CM | POA: Diagnosis not present

## 2017-08-22 DIAGNOSIS — Z8249 Family history of ischemic heart disease and other diseases of the circulatory system: Secondary | ICD-10-CM | POA: Diagnosis not present

## 2017-08-22 DIAGNOSIS — I25118 Atherosclerotic heart disease of native coronary artery with other forms of angina pectoris: Secondary | ICD-10-CM | POA: Diagnosis not present

## 2017-08-22 DIAGNOSIS — Z87891 Personal history of nicotine dependence: Secondary | ICD-10-CM | POA: Insufficient documentation

## 2017-08-22 DIAGNOSIS — Z79899 Other long term (current) drug therapy: Secondary | ICD-10-CM | POA: Diagnosis not present

## 2017-08-22 DIAGNOSIS — I2510 Atherosclerotic heart disease of native coronary artery without angina pectoris: Secondary | ICD-10-CM | POA: Insufficient documentation

## 2017-08-22 DIAGNOSIS — I10 Essential (primary) hypertension: Secondary | ICD-10-CM | POA: Diagnosis not present

## 2017-08-22 DIAGNOSIS — Z833 Family history of diabetes mellitus: Secondary | ICD-10-CM | POA: Diagnosis not present

## 2017-08-22 DIAGNOSIS — M109 Gout, unspecified: Secondary | ICD-10-CM | POA: Insufficient documentation

## 2017-08-22 DIAGNOSIS — Z7902 Long term (current) use of antithrombotics/antiplatelets: Secondary | ICD-10-CM | POA: Diagnosis not present

## 2017-08-22 DIAGNOSIS — Z79890 Hormone replacement therapy (postmenopausal): Secondary | ICD-10-CM | POA: Insufficient documentation

## 2017-08-22 DIAGNOSIS — Z7982 Long term (current) use of aspirin: Secondary | ICD-10-CM | POA: Insufficient documentation

## 2017-08-22 DIAGNOSIS — I255 Ischemic cardiomyopathy: Secondary | ICD-10-CM | POA: Insufficient documentation

## 2017-08-22 NOTE — Progress Notes (Signed)
Paramedicine Encounter   Patient ID: Danielle [name] , female,   DOB: 04/23/1946,71 y.o.,  MRN: [id number]  [name] was seen in the clinic today and reported feeling well. No medication changes at this time but [name] has advised her to take an additional torsemide today and tomorrow for increased fluid retention. No further changes were necessary at this time.   Time spent with patient: 20 minutes  [name], EMT 08/22/2017   ACTION: Home visit completed Next visit planned for 1 week

## 2017-08-22 NOTE — Patient Instructions (Signed)
Take extra torsemide tablet once today and once tomorrow.  Follow up 6 months with echocardiogram and appointment with Dr. Aundra Dubin. We will call you closer to this time, or you may call our office to schedule 1 month before you are due to be seen.  Take all medication as prescribed the day of your appointment. Bring all medications with you to your appointment. Failure to take morning medications before appointment or bring your medications with you may result in rescheduling your appointment.  Do the following things EVERYDAY: 1) Weigh yourself in the morning before breakfast. Write it down and keep it in a log. 2) Take your medicines as prescribed 3) Eat low salt foods-Limit salt (sodium) to 2000 mg per day.  4) Stay as active as you can everyday 5) Limit all fluids for the day to less than 2 liters

## 2017-08-23 ENCOUNTER — Other Ambulatory Visit (HOSPITAL_COMMUNITY): Payer: Self-pay

## 2017-08-23 ENCOUNTER — Telehealth: Payer: Self-pay | Admitting: *Deleted

## 2017-08-23 LAB — CUP PACEART REMOTE DEVICE CHECK
Battery Voltage: 3.02 V
Brady Statistic RA Percent Paced: 1.55 %
Date Time Interrogation Session: 20190326145812
Implantable Lead Implant Date: 20160222
Implantable Lead Implant Date: 20160222
Implantable Lead Location: 753860
Implantable Lead Model: 5076
Implantable Lead Model: 5076
Implantable Pulse Generator Implant Date: 20160222
Lead Channel Impedance Value: 361 Ohm
Lead Channel Impedance Value: 399 Ohm
Lead Channel Impedance Value: 475 Ohm
Lead Channel Pacing Threshold Amplitude: 0.75 V
Lead Channel Pacing Threshold Pulse Width: 0.4 ms
Lead Channel Pacing Threshold Pulse Width: 0.4 ms
Lead Channel Sensing Intrinsic Amplitude: 4.375 mV
Lead Channel Setting Pacing Amplitude: 2 V
MDC IDC LEAD LOCATION: 753859
MDC IDC MSMT BATTERY REMAINING LONGEVITY: 95 mo
MDC IDC MSMT LEADCHNL RA IMPEDANCE VALUE: 399 Ohm
MDC IDC MSMT LEADCHNL RA SENSING INTR AMPL: 4.375 mV
MDC IDC MSMT LEADCHNL RV PACING THRESHOLD AMPLITUDE: 0.875 V
MDC IDC MSMT LEADCHNL RV SENSING INTR AMPL: 19.5 mV
MDC IDC MSMT LEADCHNL RV SENSING INTR AMPL: 19.5 mV
MDC IDC SET LEADCHNL RV PACING AMPLITUDE: 2 V
MDC IDC SET LEADCHNL RV PACING PULSEWIDTH: 0.4 ms
MDC IDC SET LEADCHNL RV SENSING SENSITIVITY: 2 mV
MDC IDC STAT BRADY AP VP PERCENT: 1.27 %
MDC IDC STAT BRADY AP VS PERCENT: 0.29 %
MDC IDC STAT BRADY AS VP PERCENT: 5.49 %
MDC IDC STAT BRADY AS VS PERCENT: 92.95 %
MDC IDC STAT BRADY RV PERCENT PACED: 6.44 %

## 2017-08-23 MED ORDER — CARVEDILOL 12.5 MG PO TABS: 12.5000 mg | ORAL_TABLET | Freq: Two times a day (BID) | ORAL | 11 refills | Status: DC

## 2017-08-23 NOTE — Telephone Encounter (Signed)
Ms. Haffey made aware of medication adjustment. She will take 2 tablets of the 6.25mg  BID Coreg as to not waste her medication. New Rx sent to [facility] on [address].

## 2017-08-23 NOTE — Progress Notes (Signed)
Paramedicine Encounter    Patient ID: Danielle [name], female    DOB: August 18, 1945, 72 y.o.   MRN: [id number]   Patient Care Team: Danielle Carol, MD as PCP - General (Internal Medicine)  Patient Active Problem List   Diagnosis Date Noted  . OSA (obstructive sleep apnea) 02/18/2017  . Acute combined systolic and diastolic congestive heart failure ([place]) 02/05/2017  . Essential hypertension   . Diabetes mellitus without complication ([place])   . Cancer ([place])   . Thyroid disease     Current Outpatient Medications:  .  acetaminophen (TYLENOL) 650 MG CR tablet, Take 650 mg by mouth every 8 (eight) hours as needed for pain., Disp: , Rfl:  .  allopurinol (ZYLOPRIM) 100 MG tablet, Take 100 mg by mouth daily., Disp: , Rfl:  .  Ascorbic Acid (VITAMIN C PO), Take 1 tablet by mouth daily., Disp: , Rfl:  .  aspirin 81 MG tablet, Take 1 tablet (81 mg total) by mouth daily., Disp: 30 tablet, Rfl: 3 .  atorvastatin (LIPITOR) 80 MG tablet, Take 1 tablet (80 mg total) by mouth at bedtime., Disp: 30 tablet, Rfl: 11 .  B Complex-C (SUPER B COMPLEX PO), Take 2 tablets by mouth daily., Disp: , Rfl:  .  Biotin 10 MG CAPS, Take 10 mg by mouth daily., Disp: , Rfl:  .  calcium citrate (CALCITRATE - DOSED IN MG ELEMENTAL CALCIUM) 950 MG tablet, Take 1 tablet by mouth 2 (two) times daily. , Disp: , Rfl:  .  carvedilol (COREG) 6.25 MG tablet, Take 1 tablet (6.25 mg total) by mouth 2 (two) times daily., Disp: 60 tablet, Rfl: 3 .  CHONDROITIN SULFATE PO, Take 250 mg by mouth daily., Disp: , Rfl:  .  colchicine 0.6 MG tablet, Take 0.6 mg by mouth 2 (two) times daily., Disp: , Rfl:  .  ferrous sulfate 325 (65 FE) MG tablet, Take 325 mg by mouth 2 (two) times daily with a meal., Disp: , Rfl:  .  folic acid (FOLVITE) 1 MG tablet, Take 1 mg by mouth daily., Disp: , Rfl:  .  ibuprofen (ADVIL,MOTRIN) 800 MG tablet, Take 800 mg by mouth every 6 (six) hours as needed for mild pain (gout/arthritis pain)., Disp: , Rfl:  .  insulin  glargine (LANTUS) 100 UNIT/ML injection, Inject 30 Units into the skin 2 (two) times daily. , Disp: , Rfl:  .  insulin lispro (HUMALOG) 100 UNIT/ML injection, Inject 5 Units into the skin daily with lunch. , Disp: , Rfl:  .  isosorbide mononitrate (IMDUR) 30 MG 24 hr tablet, Take 1 tablet (30 mg total) by mouth daily., Disp: 30 tablet, Rfl: 3 .  Lactobacillus (ACIDOPHILUS PO), Take 1 capsule by mouth daily., Disp: , Rfl:  .  levothyroxine (SYNTHROID, LEVOTHROID) 137 MCG tablet, Take 1 tablet (137 mcg total) daily before breakfast by mouth. Further refills must come from primary care doctor., Disp: 30 tablet, Rfl: 0 .  losartan (COZAAR) 25 MG tablet, Take 25 mg by mouth daily., Disp: , Rfl:  .  metFORMIN (GLUCOPHAGE) 500 MG tablet, Take 500 mg by mouth 2 (two) times daily with a meal. , Disp: , Rfl:  .  nitroGLYCERIN (NITROSTAT) 0.4 MG SL tablet, Place 1 tablet (0.4 mg total) under the tongue every 5 (five) minutes as needed for chest pain., Disp: 25 tablet, Rfl: 0 .  omeprazole (PRILOSEC) 40 MG capsule, Take 40 mg by mouth daily., Disp: , Rfl:  .  potassium chloride (K-DUR,KLOR-CON) 10 MEQ tablet, Take  2 tablets (20 mEq total) by mouth daily., Disp: 60 tablet, Rfl: 5 .  spironolactone (ALDACTONE) 25 MG tablet, Take 1 tablet (25 mg total) by mouth every other day., Disp: 15 tablet, Rfl: 5 .  ticagrelor (BRILINTA) 90 MG TABS tablet, Take 1 tablet (90 mg total) by mouth 2 (two) times daily., Disp: 60 tablet, Rfl: 3 .  torsemide (DEMADEX) 20 MG tablet, Take 20 mg by mouth 2 (two) times daily. , Disp: , Rfl:  .  Travoprost, BAK Free, (TRAVATAN) 0.004 % SOLN ophthalmic solution, Place 1 drop into both eyes daily., Disp: , Rfl:  Allergies  Allergen Reactions  . Sulfa Antibiotics Rash      Social History   Socioeconomic History  . Marital status: Married    Spouse name: Not on file  . Number of children: Not on file  . Years of education: Not on file  . Highest education level: Not on file   Occupational History  . Not on file  Social Needs  . Financial resource strain: Not on file  . Food insecurity:    Worry: Not on file    Inability: Not on file  . Transportation needs:    Medical: Not on file    Non-medical: Not on file  Tobacco Use  . Smoking status: Former Smoker    Packs/day: 1.00    Years: 22.00    Pack years: 22.00    Types: Cigarettes    Last attempt to quit: 1983    Years since quitting: 36.2  . Smokeless tobacco: Never Used  Substance and Sexual Activity  . Alcohol use: Yes    Comment: ocassional  . Drug use: No  . Sexual activity: Never    Birth control/protection: None, Post-menopausal  Lifestyle  . Physical activity:    Days per week: Not on file    Minutes per session: Not on file  . Stress: Not on file  Relationships  . Social connections:    Talks on phone: Not on file    Gets together: Not on file    Attends religious service: Not on file    Active member of club or organization: Not on file    Attends meetings of clubs or organizations: Not on file    Relationship status: Not on file  . Intimate partner violence:    Fear of current or ex partner: Not on file    Emotionally abused: Not on file    Physically abused: Not on file    Forced sexual activity: Not on file  Other Topics Concern  . Not on file  Social History Narrative  . Not on file    Physical Exam      Future Appointments  Date Time Provider [place]  08/27/2017  9:45 AM Danielle Haw, MD [facility]  11/11/2017  9:00 AM Danielle Margarita, MD [facility]  11/19/2017  7:20 AM [facility] DEVICE REMOTES [facility]    There were no vitals taken for this visit.   Danielle [name] was seen at home today to fill her pillbox since she was unable to bring her medications to the clinic yesterday. She is currently out of isosorbide and carvedilol. She needs several other medications, all of which were ordered from the  pharmacy with the exception of omeprazole because it was from a physician in [place] and she did not have a prescription on file with the [facility]. I advised her to contact her PCP to get a new  prescription. She also stated she is only taking a half a pill of her levothyroxine because her PCP told her he wants to decrease her to 100 mcg and her current pills are 134 mcg. She is currently overwhelmed by the amount of medications she is taking so I contacted Doroteo Bradford and set up a pharmacy appointment in the clinic so that we could decrease any OTC medications that are not necessary. The appointment is set for Tuesday April 16th at 11:00. No further issues were noted at this time.   Time spent with patient: 50 minutes  [name], EMT 08/23/17  ACTION: Home visit completed Next visit planned for 1 week

## 2017-08-23 NOTE — Telephone Encounter (Signed)
-----   Message from Will Meredith Leeds, MD sent at 08/23/2017 10:59 AM EDT ----- Abnormal device interrogation reviewed.  Lead parameters and battery status stable.  NSVT noted. Increase coreg to 12.5 mg.

## 2017-08-26 DIAGNOSIS — G4733 Obstructive sleep apnea (adult) (pediatric): Secondary | ICD-10-CM | POA: Diagnosis not present

## 2017-08-27 ENCOUNTER — Encounter (HOSPITAL_COMMUNITY): Payer: Medicare Other

## 2017-08-27 ENCOUNTER — Ambulatory Visit: Payer: Medicare HMO | Admitting: Cardiology

## 2017-08-27 ENCOUNTER — Encounter: Payer: Self-pay | Admitting: Cardiology

## 2017-08-27 VITALS — BP 160/90 | HR 97 | Ht 64.0 in | Wt 203.8 lb

## 2017-08-27 DIAGNOSIS — I2510 Atherosclerotic heart disease of native coronary artery without angina pectoris: Secondary | ICD-10-CM | POA: Diagnosis not present

## 2017-08-27 DIAGNOSIS — G4733 Obstructive sleep apnea (adult) (pediatric): Secondary | ICD-10-CM

## 2017-08-27 DIAGNOSIS — I10 Essential (primary) hypertension: Secondary | ICD-10-CM

## 2017-08-27 DIAGNOSIS — I255 Ischemic cardiomyopathy: Secondary | ICD-10-CM | POA: Diagnosis not present

## 2017-08-27 DIAGNOSIS — I495 Sick sinus syndrome: Secondary | ICD-10-CM

## 2017-08-27 DIAGNOSIS — I472 Ventricular tachycardia: Secondary | ICD-10-CM

## 2017-08-27 DIAGNOSIS — I4729 Other ventricular tachycardia: Secondary | ICD-10-CM

## 2017-08-27 MED ORDER — CARVEDILOL 25 MG PO TABS: 25.0000 mg | ORAL_TABLET | Freq: Two times a day (BID) | ORAL | 3 refills | Status: DC

## 2017-08-27 NOTE — Patient Instructions (Addendum)
Medication Instructions:  Your physician has recommended you make the following change in your medication:  1. INCREASE Carvedilol to 25 mg twice daily 2. INCREASE Torsemide to 40 mg twice daily for the next 3 days, then return to        normal dosing.  *If you need a refill on your cardiac medications before your next appointment, please call your pharmacy*  Labwork: None ordered  Testing/Procedures: None ordered  Follow-Up: Remote monitoring is used to monitor your Pacemaker or ICD from home. This monitoring reduces the number of office visits required to check your device to one time per year. It allows [place] to keep an eye on the functioning of your device to ensure it is working properly. You are scheduled for a device check from home on 11/19/2017. You may send your transmission at any time that day. If you have a wireless device, the transmission will be sent automatically. After your physician reviews your transmission, you will receive a postcard with your next transmission date.  Your physician wants you to follow-up in: 1 year with Dr. Curt Bears.  You will receive a reminder letter in the mail two months in advance. If you don't receive a letter, please call our office to schedule the follow-up appointment.  Thank you for choosing [facility]!!   Trinidad Curet, RN [phone]  Any Other Special Instructions Will Be Listed Below (If Applicable). Follow up with Dr. Haroldine Laws if you continue to experience swelling.

## 2017-08-27 NOTE — Progress Notes (Signed)
Electrophysiology Office Note   Date:  08/27/2017   ID:  Danielle [name], DOB Dec 23, 1945, MRN [id number]  PCP:  Seward Carol, MD  Cardiologist:  [place] Primary Electrophysiologist:  Anasophia Pecor Meredith Leeds, MD    Chief Complaint  Patient presents with  . Pacemaker Check     History of Present Illness: Danielle [name] is a 72 y.o. female who is being seen today for the evaluation of CHF at the request of Seward Carol, MD. Presenting today for electrophysiology evaluation. Has a history of systolic heart failure with an EF 45-50%, coronary disease, nonischemic cardiomyopathy, hypertension, diabetes, hypothyroidism, and sleep apnea. She had PCI to the LAD in 2007. She was planned for elective right knee replacement February 2016. After induction of anesthesia, the patient became hypotensive and bradycardic. EKG showed ST elevation in the anterior wall. She had an emergent cath and found her LAD stent thrombosed. Her ejection fraction time was 10% which improved to 45-50%. She had greater than 3 second pauses and had a Medtronic dual-chamber pacemaker implanted 07/19/14.   She was admitted 9/9-9/12/18 with shortness of breath and acute on chronic diastolic heart failure. He was diuresed 12 pounds with a discharge weight of 215 pounds.   Today, denies symptoms of palpitations, chest pain, shortness of breath, orthopnea, PND, claudication, dizziness, presyncope, syncope, bleeding, or neurologic sequela. The patient is tolerating medications without difficulties.  Main complaint today is of weakness, fatigue, and lower extremity edema.  She does have class II-III heart failure.  Her edema has been occurring for the last few weeks.  She is now wearing compression stockings.  She is also dealing with social issues.  Her brother and brother-in-law died in the last few months.  Past Medical History:  Diagnosis Date  . Arthritis   . Blood dyscrasia    per pt-has small blood cells-appears as if anemic    . Cancer (HCC)    breast  . CHF (congestive heart failure) ([place])   . Complication of anesthesia    difficult to awaken from per pt  . Diabetes mellitus without complication ([place])   . Dyspnea   . Essential hypertension   . Heart disease   . Hypothyroidism   . Presence of permanent cardiac pacemaker   . Sleep apnea   . Thyroid disease    hypothyroid   Past Surgical History:  Procedure Laterality Date  . BREAST LUMPECTOMY WITH NEEDLE LOCALIZATION AND AXILLARY LYMPH NODE DISSECTION  1985  . BREAST SURGERY Left    partial mastectomy  . CORONARY STENT PLACEMENT  2010, 2012,2017    2 done 2010 and 1 replaced 2012 and 2 replaced in 2017  . EYE SURGERY Bilateral    bilateral lens implant  . INSERT / REPLACE / REMOVE PACEMAKER  07/19/2014  . REPLACEMENT TOTAL KNEE Left 09/2012  . THYROIDECTOMY, PARTIAL  1978  . TONSILLECTOMY       Current Outpatient Medications  Medication Sig Dispense Refill  . acetaminophen (TYLENOL) 650 MG CR tablet Take 650 mg by mouth every 8 (eight) hours as needed for pain.    Marland Kitchen allopurinol (ZYLOPRIM) 100 MG tablet Take 100 mg by mouth daily.    . Ascorbic Acid (VITAMIN C PO) Take 1 tablet by mouth daily.    Marland Kitchen aspirin 81 MG tablet Take 1 tablet (81 mg total) by mouth daily. 30 tablet 3  . atorvastatin (LIPITOR) 80 MG tablet Take 1 tablet (80 mg total) by mouth at bedtime. 30 tablet 11  . B  Complex-C (SUPER B COMPLEX PO) Take 2 tablets by mouth daily.    . Biotin 10 MG CAPS Take 10 mg by mouth daily.    . calcium citrate (CALCITRATE - DOSED IN MG ELEMENTAL CALCIUM) 950 MG tablet Take 1 tablet by mouth 2 (two) times daily.     . carvedilol (COREG) 12.5 MG tablet Take 1 tablet (12.5 mg total) by mouth 2 (two) times daily. 60 tablet 11  . CHONDROITIN SULFATE PO Take 250 mg by mouth daily.    . colchicine 0.6 MG tablet Take 0.6 mg by mouth 2 (two) times daily.    . ferrous sulfate 325 (65 FE) MG tablet Take 325 mg by mouth 2 (two) times daily with a meal.    .  folic acid (FOLVITE) 1 MG tablet Take 1 mg by mouth daily.    Marland Kitchen ibuprofen (ADVIL,MOTRIN) 800 MG tablet Take 800 mg by mouth every 6 (six) hours as needed for mild pain (gout/arthritis pain).    . insulin glargine (LANTUS) 100 UNIT/ML injection Inject 30 Units into the skin 2 (two) times daily.     . insulin lispro (HUMALOG) 100 UNIT/ML injection Inject 5 Units into the skin daily with lunch.     . isosorbide mononitrate (IMDUR) 30 MG 24 hr tablet Take 1 tablet (30 mg total) by mouth daily. 30 tablet 3  . Lactobacillus (ACIDOPHILUS PO) Take 1 capsule by mouth daily.    Marland Kitchen levothyroxine (SYNTHROID, LEVOTHROID) 137 MCG tablet Take 1 tablet (137 mcg total) daily before breakfast by mouth. Further refills must come from primary care doctor. 30 tablet 0  . losartan (COZAAR) 25 MG tablet Take 25 mg by mouth daily.    . metFORMIN (GLUCOPHAGE) 500 MG tablet Take 500 mg by mouth 2 (two) times daily with a meal.     . nitroGLYCERIN (NITROSTAT) 0.4 MG SL tablet Place 1 tablet (0.4 mg total) under the tongue every 5 (five) minutes as needed for chest pain. 25 tablet 0  . omeprazole (PRILOSEC) 40 MG capsule Take 40 mg by mouth daily.    . potassium chloride (K-DUR,KLOR-CON) 10 MEQ tablet Take 2 tablets (20 mEq total) by mouth daily. 60 tablet 5  . spironolactone (ALDACTONE) 25 MG tablet Take 1 tablet (25 mg total) by mouth every other day. 15 tablet 5  . ticagrelor (BRILINTA) 90 MG TABS tablet Take 1 tablet (90 mg total) by mouth 2 (two) times daily. 60 tablet 3  . torsemide (DEMADEX) 20 MG tablet Take 20 mg by mouth 2 (two) times daily.     . Travoprost, BAK Free, (TRAVATAN) 0.004 % SOLN ophthalmic solution Place 1 drop into both eyes daily.     No current facility-administered medications for this visit.     Allergies:   Sulfa antibiotics   Social History:  The patient  reports that she quit smoking about 36 years ago. Her smoking use included cigarettes. She has a 22.00 pack-year smoking history. She has  never used smokeless tobacco. She reports that she drinks alcohol. She reports that she does not use drugs.   Family History:  The patient's family history includes Diabetes in her sister; Heart disease in her brother and father; Multiple myeloma in her mother; Other in her sister and sister.    ROS:  Please see the history of present illness.   Otherwise, review of systems is positive for weakness, fatigue, lower extremity edema.   All other systems are reviewed and negative.   PHYSICAL EXAM: VS:  BP (!) 160/90   Pulse 97   Ht 5' 4"  (1.626 m)   Wt 203 lb 12.8 oz (92.4 kg)   SpO2 99%   BMI 34.98 kg/m  , BMI Body mass index is 34.98 kg/m. GEN: Well nourished, well developed, in no acute distress  HEENT: normal  Neck: no JVD, carotid bruits, or masses Cardiac: RRR; no murmurs, rubs, or gallops, 2+ edema  Respiratory:  clear to auscultation bilaterally, normal work of breathing GI: soft, nontender, nondistended, + BS MS: no deformity or atrophy  Skin: warm and dry, device site well healed Neuro:  Strength and sensation are intact Psych: euthymic mood, full affect  EKG:  EKG is not ordered today. Personal review of the ekg ordered 03/05/17 shows SR, IMI, rate 88  Personal review of the device interrogation today. Results in [place]: 02/03/2017: ALT 35 02/05/2017: Hemoglobin 9.1; Platelets 190 05/13/2017: B Natriuretic Peptide 218.6; BUN 20; Creatinine, Ser 1.09; Potassium 4.0; Sodium 140    Lipid Panel     Component Value Date/Time   CHOL 87 03/06/2017 1128   TRIG 123 03/06/2017 1128   HDL 38 (L) 03/06/2017 1128   CHOLHDL 2.3 03/06/2017 1128   VLDL 25 03/06/2017 1128   LDLCALC 24 03/06/2017 1128     Wt Readings from Last 3 Encounters:  08/27/17 203 lb 12.8 oz (92.4 kg)  08/22/17 203 lb (92.1 kg)  06/15/17 212 lb (96.2 kg)      Other studies Reviewed: Additional studies/ records that were reviewed today include: TTE 02/04/17  Review of the above  records today demonstrates:  - Left ventricle: The cavity size was normal. Wall thickness was   increased in a pattern of mild LVH. Systolic function was mildly   reduced. The estimated ejection fraction was in the range of 45%   to 50%. Features are consistent with a pseudonormal left   ventricular filling pattern, with concomitant abnormal relaxation   and increased filling pressure (grade 2 diastolic dysfunction).   Doppler parameters are consistent with high ventricular filling   pressure. - Aortic valve: There was trivial regurgitation. - Mitral valve: Calcified annulus. Mildly thickened leaflets .   There was mild regurgitation. - Left atrium: The atrium was severely dilated. - Pulmonary arteries: PA peak pressure: 44 mm Hg (S).  Left heart cath 02/13/16 Left main normal LAD multiple stents in the mid to distal LAD. Mid LAD 80% stenosis distal LAD 99% stenosis, drug-eluting stent placed. Proximal LAD 30% Mid circumflex 50%, FFR 0.94 Proximal RCA 25%, mid RCA 25%, PDA 30%  ASSESSMENT AND PLAN:  1.  Chronic systolic heart failure due to ischemic cardiomyopathy: 45-50% in a restrictive pattern.  Currently on optimal medical therapy.  Her blood pressure is elevated today and she had nonsustained VT on her device interrogation.  We Sal Danielle [name] increase her carvedilol to 25 mg.  Should she have more nonsustained VT with prolonged episodes, she would likely require antiarrhythmics.  She is also volume overloaded.  We Robyn Danielle [name] increase her torsemide to 40 mg twice a day for 3 days.  Have instructed her to call heart failure clinic if she continues to have swelling.  2. 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  Currently on aspirin.  No current chest pain.    3. Hypertension: Pressure elevated today.  Has been well controlled in the past.  Planning to increase her carvedilol due to blood pressure and nonsustained VT.  4. Sick sinus syndrome: Status  post Medtronic dual-chamber pacemaker.  Device functioning appropriately.  No changes.  5. Obstructive sleep apnea: Diagnosed with sleep apnea on sleep study.  Trying to get used to CPAP  6.   nonsustained ventricular tachycardia: Found on device interrogation.  Plan to increase carvedilol to 25 mg.  Sharel Danielle [name] possibly need antiarrhythmics in the future.  Current medicines are reviewed at length with the patient today.   The patient does not have concerns regarding her medicines.  The following changes were made today:  none  Labs/ tests ordered today include:  No orders of the defined types were placed in this encounter.    Disposition:   FU with Marvie Danielle [name] 12 months  Signed, Lakrisha Iseman Meredith Leeds, MD  08/27/2017 9:58 AM     [facility] [address] [place] [address] [phone] (office) [phone] (fax)

## 2017-08-29 ENCOUNTER — Other Ambulatory Visit (HOSPITAL_COMMUNITY): Payer: Self-pay

## 2017-08-29 ENCOUNTER — Encounter (HOSPITAL_COMMUNITY): Payer: Medicare Other

## 2017-08-29 NOTE — Progress Notes (Signed)
Paramedicine Encounter    Patient ID: Danielle [name], female    DOB: 1945/06/09, 72 y.o.   MRN: [id number]   Patient Care Team: Seward Carol, MD as PCP - General (Internal Medicine)  Patient Active Problem List   Diagnosis Date Noted  . OSA (obstructive sleep apnea) 02/18/2017  . Acute combined systolic and diastolic congestive heart failure ([place]) 02/05/2017  . Essential hypertension   . Diabetes mellitus without complication ([place])   . Cancer ([place])   . Thyroid disease     Current Outpatient Medications:  .  acetaminophen (TYLENOL) 650 MG CR tablet, Take 650 mg by mouth every 8 (eight) hours as needed for pain., Disp: , Rfl:  .  allopurinol (ZYLOPRIM) 100 MG tablet, Take 100 mg by mouth daily., Disp: , Rfl:  .  Ascorbic Acid (VITAMIN C PO), Take 1 tablet by mouth daily., Disp: , Rfl:  .  aspirin 81 MG tablet, Take 1 tablet (81 mg total) by mouth daily., Disp: 30 tablet, Rfl: 3 .  atorvastatin (LIPITOR) 80 MG tablet, Take 1 tablet (80 mg total) by mouth at bedtime., Disp: 30 tablet, Rfl: 11 .  B Complex-C (SUPER B COMPLEX PO), Take 2 tablets by mouth daily., Disp: , Rfl:  .  Biotin 10 MG CAPS, Take 10 mg by mouth daily., Disp: , Rfl:  .  calcium citrate (CALCITRATE - DOSED IN MG ELEMENTAL CALCIUM) 950 MG tablet, Take 1 tablet by mouth 2 (two) times daily. , Disp: , Rfl:  .  carvedilol (COREG) 25 MG tablet, Take 1 tablet (25 mg total) by mouth 2 (two) times daily., Disp: 180 tablet, Rfl: 3 .  ferrous sulfate 325 (65 FE) MG tablet, Take 325 mg by mouth 2 (two) times daily with a meal., Disp: , Rfl:  .  folic acid (FOLVITE) 1 MG tablet, Take 1 mg by mouth daily., Disp: , Rfl:  .  insulin glargine (LANTUS) 100 UNIT/ML injection, Inject 30 Units into the skin 2 (two) times daily. , Disp: , Rfl:  .  insulin lispro (HUMALOG) 100 UNIT/ML injection, Inject 5 Units into the skin daily with lunch. , Disp: , Rfl:  .  isosorbide mononitrate (IMDUR) 30 MG 24 hr tablet, Take 1 tablet (30 mg total) by  mouth daily., Disp: 30 tablet, Rfl: 3 .  levothyroxine (SYNTHROID, LEVOTHROID) 137 MCG tablet, Take 1 tablet (137 mcg total) daily before breakfast by mouth. Further refills must come from primary care doctor., Disp: 30 tablet, Rfl: 0 .  losartan (COZAAR) 25 MG tablet, Take 25 mg by mouth daily., Disp: , Rfl:  .  metFORMIN (GLUCOPHAGE) 500 MG tablet, Take 500 mg by mouth 2 (two) times daily with a meal. , Disp: , Rfl:  .  omeprazole (PRILOSEC) 40 MG capsule, Take 40 mg by mouth daily., Disp: , Rfl:  .  potassium chloride (K-DUR,KLOR-CON) 10 MEQ tablet, Take 2 tablets (20 mEq total) by mouth daily., Disp: 60 tablet, Rfl: 5 .  spironolactone (ALDACTONE) 25 MG tablet, Take 1 tablet (25 mg total) by mouth every other day., Disp: 15 tablet, Rfl: 5 .  ticagrelor (BRILINTA) 90 MG TABS tablet, Take 1 tablet (90 mg total) by mouth 2 (two) times daily., Disp: 60 tablet, Rfl: 3 .  torsemide (DEMADEX) 20 MG tablet, Take 20 mg by mouth 2 (two) times daily. , Disp: , Rfl:  .  Travoprost, BAK Free, (TRAVATAN) 0.004 % SOLN ophthalmic solution, Place 1 drop into both eyes daily., Disp: , Rfl:  .  CHONDROITIN  SULFATE PO, Take 250 mg by mouth daily., Disp: , Rfl:  .  colchicine 0.6 MG tablet, Take 0.6 mg by mouth 2 (two) times daily., Disp: , Rfl:  .  ibuprofen (ADVIL,MOTRIN) 800 MG tablet, Take 800 mg by mouth every 6 (six) hours as needed for mild pain (gout/arthritis pain)., Disp: , Rfl:  .  Lactobacillus (ACIDOPHILUS PO), Take 1 capsule by mouth daily., Disp: , Rfl:  .  nitroGLYCERIN (NITROSTAT) 0.4 MG SL tablet, Place 1 tablet (0.4 mg total) under the tongue every 5 (five) minutes as needed for chest pain. (Patient not taking: Reported on 08/29/2017), Disp: 25 tablet, Rfl: 0 Allergies  Allergen Reactions  . Sulfa Antibiotics Rash      Social History   Socioeconomic History  . Marital status: Married    Spouse name: Not on file  . Number of children: Not on file  . Years of education: Not on file  .  Highest education level: Not on file  Occupational History  . Not on file  Social Needs  . Financial resource strain: Not on file  . Food insecurity:    Worry: Not on file    Inability: Not on file  . Transportation needs:    Medical: Not on file    Non-medical: Not on file  Tobacco Use  . Smoking status: Former Smoker    Packs/day: 1.00    Years: 22.00    Pack years: 22.00    Types: Cigarettes    Last attempt to quit: 1983    Years since quitting: 36.2  . Smokeless tobacco: Never Used  Substance and Sexual Activity  . Alcohol use: Yes    Comment: ocassional  . Drug use: No  . Sexual activity: Never    Birth control/protection: None, Post-menopausal  Lifestyle  . Physical activity:    Days per week: Not on file    Minutes per session: Not on file  . Stress: Not on file  Relationships  . Social connections:    Talks on phone: Not on file    Gets together: Not on file    Attends religious service: Not on file    Active member of club or organization: Not on file    Attends meetings of clubs or organizations: Not on file    Relationship status: Not on file  . Intimate partner violence:    Fear of current or ex partner: Not on file    Emotionally abused: Not on file    Physically abused: Not on file    Forced sexual activity: Not on file  Other Topics Concern  . Not on file  Social History Narrative  . Not on file    Physical Exam  Constitutional: She is oriented to person, place, and time.  Cardiovascular: Normal rate and regular rhythm.  Pulmonary/Chest: Effort normal.  Abdominal: Soft. She exhibits no distension.  Musculoskeletal: Normal range of motion. She exhibits edema.  Neurological: She is alert and oriented to person, place, and time.  Skin: Skin is warm and dry.  Psychiatric: She has a normal mood and affect.        Future Appointments  Date Time Provider [place]  09/10/2017 11:00 AM [facility] PHARMACY [facility] None  11/11/2017  9:00 AM  Sueanne Margarita, MD [facility]  11/19/2017  7:20 AM [facility] DEVICE REMOTES [facility]    BP 128/76 (BP Location: Right Arm, Patient Position: Sitting, Cuff Size: Large)   Pulse 80   Resp 16  Wt 216 lb (98 kg)   SpO2 97%   BMI 37.08 kg/m   Weight yesterday- 220 lb  Last visit weight-  MS Baldi was seen at home today and reported feeling well. She denied SOB, headache, dizziness or orthopnea. She reported being compliant with all her medications with the exception of losartan. She stated she saw that there were two losartan tablets in her medication box and thought there was only supposed to be one so she took the "extra" out. I advised that the reason for there being two pills of losartan was because she is supposed the be taking 50 mg daily and she only has 25 mg tables. She stated she understood and would not remove the "extra" pills this week. She will be leaving town this weekend for a funeral in [place] but she will be back by next week.   Time spent with patient: 34 minutes  [name], EMT 08/29/17  ACTION: Home visit completed Next visit planned for 1 week

## 2017-09-03 ENCOUNTER — Encounter (HOSPITAL_COMMUNITY): Payer: Medicare Other

## 2017-09-05 ENCOUNTER — Encounter (HOSPITAL_COMMUNITY): Payer: Medicare Other

## 2017-09-06 ENCOUNTER — Telehealth (HOSPITAL_COMMUNITY): Payer: Self-pay | Admitting: Pharmacist

## 2017-09-06 ENCOUNTER — Other Ambulatory Visit (HOSPITAL_COMMUNITY): Payer: Self-pay

## 2017-09-06 NOTE — Progress Notes (Signed)
Paramedicine Encounter    Patient ID: Danielle [name], female    DOB: July 02, 1945, 72 y.o.   MRN: [id number]   Patient Care Team: Seward Carol, MD as PCP - General (Internal Medicine)  Patient Active Problem List   Diagnosis Date Noted  . OSA (obstructive sleep apnea) 02/18/2017  . Acute combined systolic and diastolic congestive heart failure ([place]) 02/05/2017  . Essential hypertension   . Diabetes mellitus without complication ([place])   . Cancer ([place])   . Thyroid disease     Current Outpatient Medications:  .  acetaminophen (TYLENOL) 650 MG CR tablet, Take 650 mg by mouth every 8 (eight) hours as needed for pain., Disp: , Rfl:  .  allopurinol (ZYLOPRIM) 100 MG tablet, Take 100 mg by mouth daily., Disp: , Rfl:  .  Ascorbic Acid (VITAMIN C PO), Take 1 tablet by mouth daily., Disp: , Rfl:  .  aspirin 81 MG tablet, Take 1 tablet (81 mg total) by mouth daily., Disp: 30 tablet, Rfl: 3 .  atorvastatin (LIPITOR) 80 MG tablet, Take 1 tablet (80 mg total) by mouth at bedtime., Disp: 30 tablet, Rfl: 11 .  B Complex-C (SUPER B COMPLEX PO), Take 2 tablets by mouth daily., Disp: , Rfl:  .  Biotin 10 MG CAPS, Take 10 mg by mouth daily., Disp: , Rfl:  .  calcium citrate (CALCITRATE - DOSED IN MG ELEMENTAL CALCIUM) 950 MG tablet, Take 1 tablet by mouth 2 (two) times daily. , Disp: , Rfl:  .  carvedilol (COREG) 25 MG tablet, Take 1 tablet (25 mg total) by mouth 2 (two) times daily., Disp: 180 tablet, Rfl: 3 .  ferrous sulfate 325 (65 FE) MG tablet, Take 325 mg by mouth 2 (two) times daily with a meal., Disp: , Rfl:  .  folic acid (FOLVITE) 1 MG tablet, Take 1 mg by mouth daily., Disp: , Rfl:  .  insulin glargine (LANTUS) 100 UNIT/ML injection, Inject 30 Units into the skin 2 (two) times daily. , Disp: , Rfl:  .  insulin lispro (HUMALOG) 100 UNIT/ML injection, Inject 5 Units into the skin daily with lunch. , Disp: , Rfl:  .  isosorbide mononitrate (IMDUR) 30 MG 24 hr tablet, Take 1 tablet (30 mg total) by  mouth daily., Disp: 30 tablet, Rfl: 3 .  levothyroxine (SYNTHROID, LEVOTHROID) 137 MCG tablet, Take 1 tablet (137 mcg total) daily before breakfast by mouth. Further refills must come from primary care doctor., Disp: 30 tablet, Rfl: 0 .  losartan (COZAAR) 25 MG tablet, Take 25 mg by mouth daily., Disp: , Rfl:  .  metFORMIN (GLUCOPHAGE) 500 MG tablet, Take 500 mg by mouth 2 (two) times daily with a meal. , Disp: , Rfl:  .  potassium chloride (K-DUR,KLOR-CON) 10 MEQ tablet, Take 2 tablets (20 mEq total) by mouth daily., Disp: 60 tablet, Rfl: 5 .  spironolactone (ALDACTONE) 25 MG tablet, Take 1 tablet (25 mg total) by mouth every other day., Disp: 15 tablet, Rfl: 5 .  ticagrelor (BRILINTA) 90 MG TABS tablet, Take 1 tablet (90 mg total) by mouth 2 (two) times daily., Disp: 60 tablet, Rfl: 3 .  torsemide (DEMADEX) 20 MG tablet, Take 20 mg by mouth 2 (two) times daily. , Disp: , Rfl:  .  Travoprost, BAK Free, (TRAVATAN) 0.004 % SOLN ophthalmic solution, Place 1 drop into both eyes daily., Disp: , Rfl:  .  CHONDROITIN SULFATE PO, Take 250 mg by mouth daily., Disp: , Rfl:  .  colchicine 0.6 MG  tablet, Take 0.6 mg by mouth 2 (two) times daily., Disp: , Rfl:  .  ibuprofen (ADVIL,MOTRIN) 800 MG tablet, Take 800 mg by mouth every 6 (six) hours as needed for mild pain (gout/arthritis pain)., Disp: , Rfl:  .  Lactobacillus (ACIDOPHILUS PO), Take 1 capsule by mouth daily., Disp: , Rfl:  .  nitroGLYCERIN (NITROSTAT) 0.4 MG SL tablet, Place 1 tablet (0.4 mg total) under the tongue every 5 (five) minutes as needed for chest pain. (Patient not taking: Reported on 08/29/2017), Disp: 25 tablet, Rfl: 0 .  omeprazole (PRILOSEC) 40 MG capsule, Take 40 mg by mouth daily., Disp: , Rfl:  Allergies  Allergen Reactions  . Sulfa Antibiotics Rash      Social History   Socioeconomic History  . Marital status: Married    Spouse name: Not on file  . Number of children: Not on file  . Years of education: Not on file  .  Highest education level: Not on file  Occupational History  . Not on file  Social Needs  . Financial resource strain: Not on file  . Food insecurity:    Worry: Not on file    Inability: Not on file  . Transportation needs:    Medical: Not on file    Non-medical: Not on file  Tobacco Use  . Smoking status: Former Smoker    Packs/day: 1.00    Years: 22.00    Pack years: 22.00    Types: Cigarettes    Last attempt to quit: 1983    Years since quitting: 36.3  . Smokeless tobacco: Never Used  Substance and Sexual Activity  . Alcohol use: Yes    Comment: ocassional  . Drug use: No  . Sexual activity: Never    Birth control/protection: None, Post-menopausal  Lifestyle  . Physical activity:    Days per week: Not on file    Minutes per session: Not on file  . Stress: Not on file  Relationships  . Social connections:    Talks on phone: Not on file    Gets together: Not on file    Attends religious service: Not on file    Active member of club or organization: Not on file    Attends meetings of clubs or organizations: Not on file    Relationship status: Not on file  . Intimate partner violence:    Fear of current or ex partner: Not on file    Emotionally abused: Not on file    Physically abused: Not on file    Forced sexual activity: Not on file  Other Topics Concern  . Not on file  Social History Narrative  . Not on file    Physical Exam  Constitutional: She is oriented to person, place, and time.  Cardiovascular: Normal rate and regular rhythm.  Pulmonary/Chest: Effort normal and breath sounds normal.  Abdominal: Soft. She exhibits no distension.  Musculoskeletal: Normal range of motion. She exhibits edema.  Neurological: She is alert and oriented to person, place, and time.  Skin: Skin is warm and dry.  Psychiatric: She has a normal mood and affect.        Future Appointments  Date Time Provider [place]  09/10/2017 11:00 AM [facility]  None  11/11/2017  9:00 AM Sueanne Margarita, MD [facility]  11/19/2017  7:20 AM [facility] DEVICE REMOTES [facility]    BP 100/62 (BP Location: Right Arm, Patient Position: Sitting, Cuff Size: Large)   Pulse 70  Resp 16   Wt 202 lb 6.4 oz (91.8 kg)   SpO2 97%   BMI 34.74 kg/m   Weight yesterday- 199 lb Last visit weight- 206 lb  Danielle [name] was seen at home today and reported feeling tired. She reports being unable to sleep a full night using her CPAP because it makes her mouth too dry. She stated she has been taking her medications though there is some confusion regarding her losartan and what dose she should be taking. She has an appointment next week where she will be seeing Doroteo Bradford who will clear up a lot of this. Her medications were verified and her pillbox was refilled. Noted below are medications which she was short on and need to be added to her box when she picks them up.   Carvedilol is not in Thursday AM or PM of Fri AM Folic acid needed in Fri   Time spent with patient: 45 minutes  [name], EMT 09/06/17  ACTION: Home visit completed Next visit planned for 1 week

## 2017-09-06 NOTE — Telephone Encounter (Signed)
Received a message from [facility] (paramedicine) that patient has been taking losartan 50 mg daily instead of 25 mg daily. Her BP was soft today but she did not have any dizziness/lightheadedness. Will continue on current dose and reassess at next HF clinic visit with me on 09/10/17.   Ruta Hinds. Velva Harman, PharmD, BCPS, CPP Clinical Pharmacist Phone: [phone] 09/06/2017 3:19 PM

## 2017-09-10 ENCOUNTER — Telehealth (HOSPITAL_COMMUNITY): Payer: Self-pay | Admitting: Pharmacist

## 2017-09-10 ENCOUNTER — Other Ambulatory Visit (HOSPITAL_COMMUNITY): Payer: Self-pay

## 2017-09-10 ENCOUNTER — Ambulatory Visit (HOSPITAL_COMMUNITY)
Admission: RE | Admit: 2017-09-10 | Discharge: 2017-09-10 | Disposition: A | Discharge status: Home / Self Care | Payer: Medicare HMO | Source: Ambulatory Visit | Attending: Cardiology | Admitting: Cardiology

## 2017-09-10 ENCOUNTER — Encounter (HOSPITAL_COMMUNITY): Payer: Medicare Other

## 2017-09-10 VITALS — BP 118/80 | HR 69 | Wt 207.4 lb

## 2017-09-10 DIAGNOSIS — G4733 Obstructive sleep apnea (adult) (pediatric): Secondary | ICD-10-CM | POA: Insufficient documentation

## 2017-09-10 DIAGNOSIS — E039 Hypothyroidism, unspecified: Secondary | ICD-10-CM | POA: Diagnosis not present

## 2017-09-10 DIAGNOSIS — I2510 Atherosclerotic heart disease of native coronary artery without angina pectoris: Secondary | ICD-10-CM | POA: Diagnosis not present

## 2017-09-10 DIAGNOSIS — I10 Essential (primary) hypertension: Secondary | ICD-10-CM

## 2017-09-10 DIAGNOSIS — I509 Heart failure, unspecified: Secondary | ICD-10-CM | POA: Insufficient documentation

## 2017-09-10 DIAGNOSIS — I110 Hypertensive heart disease with heart failure: Secondary | ICD-10-CM | POA: Insufficient documentation

## 2017-09-10 DIAGNOSIS — E119 Type 2 diabetes mellitus without complications: Secondary | ICD-10-CM | POA: Diagnosis not present

## 2017-09-10 LAB — BASIC METABOLIC PANEL
ANION GAP: 13 (ref 5–15)
BUN: 37 mg/dL — ABNORMAL HIGH (ref 6–20)
CALCIUM: 9.7 mg/dL (ref 8.9–10.3)
CO2: 25 mmol/L (ref 22–32)
CREATININE: 1.76 mg/dL — AB (ref 0.44–1.00)
Chloride: 101 mmol/L (ref 101–111)
GFR calc non Af Amer: 28 mL/min — ABNORMAL LOW (ref >60)
GFR, EST AFRICAN AMERICAN: 32 mL/min — AB (ref >60)
Glucose, Bld: 157 mg/dL — ABNORMAL HIGH (ref 65–99)
Potassium: 4.4 mmol/L (ref 3.5–5.1)
SODIUM: 139 mmol/L (ref 135–145)

## 2017-09-10 MED ORDER — TICAGRELOR 90 MG PO TABS: 90.0000 mg | ORAL_TABLET | Freq: Two times a day (BID) | ORAL | 3 refills | Status: DC

## 2017-09-10 MED ORDER — LOSARTAN POTASSIUM 25 MG PO TABS
25.0000 mg | ORAL_TABLET | Freq: Every day | ORAL | 5 refills | Status: DC
Start: 2017-09-10 — End: 2017-09-20

## 2017-09-10 MED ORDER — CARVEDILOL 25 MG PO TABS: 25.0000 mg | ORAL_TABLET | Freq: Two times a day (BID) | ORAL | 3 refills | Status: DC

## 2017-09-10 MED ORDER — LOSARTAN POTASSIUM 50 MG PO TABS: 50.0000 mg | ORAL_TABLET | Freq: Every day | ORAL | 5 refills | Status: DC

## 2017-09-10 NOTE — Progress Notes (Signed)
HF MD: [facility]  HPI:  Danielle [name] is a 10 year oldAfrican Americanfemale with a past medical history of systolic CHF (EF 76-19%), CAD, ischemic cardiomyopathy, HTN, DM, hypothyroidism, and OSA.   Admitted 02/03/17-02/06/17 with SOB, acute on chronic systolic CHF. Diuresed with IV lasix (12 pounds), discharge weight was 215 pounds.   Presents today forpharmacist-led HF/BPmedication titration. During a paramedicine visit on 4/12, patient was found to be taking losartan 50mg  daily instead of 25mg  daily. Her BP was soft that day but did not have dizziness/lightheadedness, so losartan 50mg  daily was continued. At her last visit with Dr. Curt Bears on 4/2, carvedilol was increased to 25mg  BID. On the same visit, she was found to be volume overloaded so torsemide was increased to 40mg  BID for 3 days, then resumed home dose of 20mg  BID. Her home weights have been stable between 197-202 lbs, but she presented today with 1-2+ pitting edema with some SOB walking into clinic. Also stated she is taking half of her levothyroxine 137 mcg because she was told to decrease to 112 mcg per PCP. She reports decreased appetite but denies N/V. During this visit, we were able to decrease OTC medications that are not necessary.   Shortness of breath/dyspnea on exertion? yes - more than normal on exertion   Orthopnea/PND? yes  Edema? Yes. 1-2+ pitting lower extremity edema  Lightheadedness/dizziness? Yes. Lightheadedness when bending over  Daily weights at home? Yes. 197-202 lbs at home.  Blood pressure/heart rate monitoring at home? paramedicine once a week  Following low-sodium/fluid-restricted diet? Yes. Occasional pretzels when craving salt.   HF/HTN Medications: Carvedilol 25mg  BID Imdur 30mg  daily Losartan 50mg  daily Spironolactone 25mg  every other day Torsemide 20mg  BID Potassium chloride 17mEq daily  Has the patient been experiencing any side effects to the medications prescribed?  no   Does the  patient have any problems obtaining medications due to transportation or finances?   No - [employer]  Understanding of regimen: fair Understanding of indications: fair Potential of compliance: good Patient understands to avoid NSAIDs. Patient understands to avoid decongestants.  Pertinent Lab Values:  09/10/17: Serum creatinine 1.76 (BL ~1.1-1.3), Potassium 4.4, BUN 37, Sodium 139  05/13/17: Scr 1.09, Potassium 4.0, Sodium 140, BNP 218.6   Vital Signs:  Weight: 207.4 lb (last clinic visit weight: 203 lbs)  Blood pressure: 118/80 mmHg  Heart rate: 69 bpm  Assessment: 1. ChronicCHF (EF45-50%), due torestrictive CM. NYHA classII-IIIsymptoms. - Volume status slightly elevated on exam with increasing SOB/edema  - Increase torsemide to 40 mg in the morning and 20 mg in the evening for 3 days, the resume 20 mg BID  - Continue losartan 50mg  daily, carvedilol 25 mg BID, spironolactone 25mg  every other day, and isosorbide mononitrate 30 mg daily - Basic disease state pathophysiology, medication indication, mechanism and side effects reviewed at length with patient and he verbalized understanding 2. CAD: Stenting in 2010, 2012 and 2016. Records pending from [place]. - Denies chest pain  - Continue ASA and statin 3. HTN - BP at goal <130/80 mmHg   - Continue medications as above 4. DM - Follows with Dr. Delfina Redwood. Sees in several weeks. 5. PPM - Says this was placed due to "slow heart rate" - Dr. Curt Bears now following. 6. OSA - Has been using CPAP for at least half the night but gets uncomfortable around 2-3 am and takes off  - Sleep study follow up with Dr. Radford Pax in June. 7. Gout -Treated per PCP.  8. Arthritis -Continue tylenol and  limit ibuprofen/meloxicam use  - Further f/u per PCP 9. Hypothyroidism  - Levothyroxine dose recently decreased to 112 mcg daily but she has not picked up this new dose yet, has been taking 1/2 tablet of her current 137 mcg tablets    - Will pick up new dose today  10. Polypharmacy  - Reviewed her current medications especially supplements/vitamins extensively  - Recommended d/c of 5 supplements since her daily multivitamin had the daily recommendations of these and updated medication list accordingly  Plan: 1) Medication changes: Based on clinical presentation, vital signs and recent labs will increase torsemide to 40 mg in the morning and 20 mg in the evening for 3 days, then resume 20 mg BID 2) Labs: BMET today 3) Follow-up: F/U with Dr. Aundra Dubin in 6 months  Danielle [name] K. Velva Harman, PharmD, BCPS, CPP Clinical Pharmacist Pager: [phone] Phone: [phone] 09/10/2017 11:15 AM

## 2017-09-10 NOTE — Telephone Encounter (Signed)
With increase in SCr today and per discussion with Dr. Aundra Dubin, have advised Ms. Strathman to decrease her losartan to 25 mg daily and take the additional torsemide x 3 days as instructed during her visit today. I have scheduled her to see [name] next Friday for recheck BMET and volume status management. Will advise Zack (paramedicine) as well. Patient verbalized understanding.   Ruta Hinds. Velva Harman, PharmD, BCPS, CPP Clinical Pharmacist Phone: [phone] 09/10/2017 2:41 PM

## 2017-09-10 NOTE — Progress Notes (Signed)
Paramedicine Encounter   Patient ID: Danielle [name] , female,   DOB: [birth year],72 y.o.,  MRN: [id number]  Ms Bowery was seen at the clinic with [name]. After review of her medications and supplements she can discontinue vitamin C, folic acid, calcium, G-01, super B complex. Since she is holding on to some fluid, she is to take an additional torsemide x 3 days. Per Doroteo Bradford losartan is to be reduced to 25 mg daily. Ms Gockley is aware of the change and was agreeable. She did not bring her pillbox to the appointment so I will see her later in the week to refill it.   Time spent with patient: 40 minutes  [name], EMT 09/10/2017   ACTION: Next visit planned for 1 week

## 2017-09-10 NOTE — Patient Instructions (Addendum)
It was great seeing you today!  Please INCREASE your torsemide to 2 tablets (40 mg) in the morning and 1 tablet (20 mg) in the evening for 3 days, then resume 1 tablet (20 mg) twice daily.  Labs today. We will call you with any changes.

## 2017-09-12 ENCOUNTER — Encounter (HOSPITAL_COMMUNITY): Payer: Medicare Other

## 2017-09-13 ENCOUNTER — Other Ambulatory Visit (HOSPITAL_COMMUNITY): Payer: Self-pay

## 2017-09-13 NOTE — Progress Notes (Signed)
Ms Edds was seen briefly today to refill her pillbox. Some of her medications changed and she is no longer taking some of the supplements she was on. Isosorbide and torsemide were called in to the pharmacy. She is out of omeprazole but does not have a prescription on file so I told her to call her PCP.  [name], EMT

## 2017-09-17 ENCOUNTER — Encounter (HOSPITAL_COMMUNITY): Payer: Medicare Other

## 2017-09-20 ENCOUNTER — Encounter (HOSPITAL_COMMUNITY): Payer: Self-pay | Admitting: *Deleted

## 2017-09-20 ENCOUNTER — Ambulatory Visit (HOSPITAL_COMMUNITY)
Admission: RE | Admit: 2017-09-20 | Discharge: 2017-09-20 | Disposition: A | Discharge status: Home / Self Care | Payer: Medicare HMO | Source: Ambulatory Visit | Attending: Internal Medicine | Admitting: Internal Medicine

## 2017-09-20 ENCOUNTER — Other Ambulatory Visit (HOSPITAL_COMMUNITY): Payer: Self-pay

## 2017-09-20 ENCOUNTER — Encounter (HOSPITAL_COMMUNITY): Payer: Self-pay

## 2017-09-20 VITALS — BP 148/82 | HR 82 | Wt 206.4 lb

## 2017-09-20 DIAGNOSIS — Z794 Long term (current) use of insulin: Secondary | ICD-10-CM | POA: Insufficient documentation

## 2017-09-20 DIAGNOSIS — G4733 Obstructive sleep apnea (adult) (pediatric): Secondary | ICD-10-CM | POA: Diagnosis not present

## 2017-09-20 DIAGNOSIS — Z7982 Long term (current) use of aspirin: Secondary | ICD-10-CM | POA: Diagnosis not present

## 2017-09-20 DIAGNOSIS — I10 Essential (primary) hypertension: Secondary | ICD-10-CM

## 2017-09-20 DIAGNOSIS — I255 Ischemic cardiomyopathy: Secondary | ICD-10-CM | POA: Diagnosis not present

## 2017-09-20 DIAGNOSIS — M7989 Other specified soft tissue disorders: Secondary | ICD-10-CM | POA: Insufficient documentation

## 2017-09-20 DIAGNOSIS — M109 Gout, unspecified: Secondary | ICD-10-CM | POA: Insufficient documentation

## 2017-09-20 DIAGNOSIS — E1142 Type 2 diabetes mellitus with diabetic polyneuropathy: Secondary | ICD-10-CM | POA: Diagnosis not present

## 2017-09-20 DIAGNOSIS — Z79899 Other long term (current) drug therapy: Secondary | ICD-10-CM | POA: Diagnosis not present

## 2017-09-20 DIAGNOSIS — M1990 Unspecified osteoarthritis, unspecified site: Secondary | ICD-10-CM | POA: Insufficient documentation

## 2017-09-20 DIAGNOSIS — Z6836 Body mass index (BMI) 36.0-36.9, adult: Secondary | ICD-10-CM | POA: Diagnosis not present

## 2017-09-20 DIAGNOSIS — I25119 Atherosclerotic heart disease of native coronary artery with unspecified angina pectoris: Secondary | ICD-10-CM | POA: Diagnosis not present

## 2017-09-20 DIAGNOSIS — I110 Hypertensive heart disease with heart failure: Secondary | ICD-10-CM | POA: Diagnosis not present

## 2017-09-20 DIAGNOSIS — Z882 Allergy status to sulfonamides status: Secondary | ICD-10-CM | POA: Diagnosis not present

## 2017-09-20 DIAGNOSIS — I5042 Chronic combined systolic (congestive) and diastolic (congestive) heart failure: Secondary | ICD-10-CM

## 2017-09-20 DIAGNOSIS — Z950 Presence of cardiac pacemaker: Secondary | ICD-10-CM | POA: Insufficient documentation

## 2017-09-20 DIAGNOSIS — I509 Heart failure, unspecified: Secondary | ICD-10-CM | POA: Diagnosis not present

## 2017-09-20 DIAGNOSIS — E119 Type 2 diabetes mellitus without complications: Secondary | ICD-10-CM | POA: Insufficient documentation

## 2017-09-20 DIAGNOSIS — I5022 Chronic systolic (congestive) heart failure: Secondary | ICD-10-CM | POA: Insufficient documentation

## 2017-09-20 DIAGNOSIS — E039 Hypothyroidism, unspecified: Secondary | ICD-10-CM | POA: Insufficient documentation

## 2017-09-20 DIAGNOSIS — I2510 Atherosclerotic heart disease of native coronary artery without angina pectoris: Secondary | ICD-10-CM | POA: Diagnosis not present

## 2017-09-20 DIAGNOSIS — Z87891 Personal history of nicotine dependence: Secondary | ICD-10-CM | POA: Diagnosis not present

## 2017-09-20 DIAGNOSIS — M545 Low back pain: Secondary | ICD-10-CM | POA: Diagnosis not present

## 2017-09-20 DIAGNOSIS — E1165 Type 2 diabetes mellitus with hyperglycemia: Secondary | ICD-10-CM | POA: Diagnosis not present

## 2017-09-20 DIAGNOSIS — E785 Hyperlipidemia, unspecified: Secondary | ICD-10-CM | POA: Diagnosis not present

## 2017-09-20 LAB — BASIC METABOLIC PANEL
ANION GAP: 13 (ref 5–15)
BUN: 19 mg/dL (ref 6–20)
CHLORIDE: 100 mmol/L — AB (ref 101–111)
CO2: 24 mmol/L (ref 22–32)
Calcium: 9.6 mg/dL (ref 8.9–10.3)
Creatinine, Ser: 1.13 mg/dL — ABNORMAL HIGH (ref 0.44–1.00)
GFR calc Af Amer: 55 mL/min — ABNORMAL LOW (ref >60)
GFR, EST NON AFRICAN AMERICAN: 48 mL/min — AB (ref >60)
GLUCOSE: 221 mg/dL — AB (ref 65–99)
POTASSIUM: 4.5 mmol/L (ref 3.5–5.1)
Sodium: 137 mmol/L (ref 135–145)

## 2017-09-20 LAB — BRAIN NATRIURETIC PEPTIDE: B Natriuretic Peptide: 117.8 pg/mL — ABNORMAL HIGH (ref 0.0–100.0)

## 2017-09-20 MED ORDER — TORSEMIDE 20 MG PO TABS: ORAL_TABLET | ORAL | 11 refills | Status: DC

## 2017-09-20 NOTE — Patient Instructions (Signed)
Routine lab work today. Will notify you of abnormal results, otherwise no news is good news!  INCREASE Torsemide to 40 mg (2 tabs) in am and 20 mg (1 tab) in pm.  Follow up 2 months with [name] Clegg NP-C.  ______________________________________________________ [name] Code: 9211  Take all medication as prescribed the day of your appointment. Bring all medications with you to your appointment.  Do the following things EVERYDAY: 1) Weigh yourself in the morning before breakfast. Write it down and keep it in a log. 2) Take your medicines as prescribed 3) Eat low salt foods-Limit salt (sodium) to 2000 mg per day.  4) Stay as active as you can everyday 5) Limit all fluids for the day to less than 2 liters

## 2017-09-20 NOTE — Progress Notes (Signed)
Advanced Heart Failure Clinic Note    Primary Care: Dr. Delfina Redwood Primary Cardiologist: Dr. Haroldine Laws  EP: Dr Tomasa Blase  HPI:  Danielle [name] is a 72 year old female with a past medical history of systolic CHF (EF 85-88%), CAD, ischemic cardiomyopathy, HTN, DM, hypothyroidism, and OSA sleep study 03/2017.   Admitted 02/03/17-02/06/17 with SOB, acute on chronic systolic CHF. Diuresed with IV lasix (12 pounds), discharge weight was 215 pounds.   Today she returns for HF follow up. Overall feeling just ok. Having lots of issues with gout pain. Ongoing leg edema. Denies SOB/PND/Orthopnea. Appetite ok. No fever or chills.  Using CPAP nightly. Weight at home 197-201 pounds. Taking all medications. Followed by paramedicine. Marland Kitchen   ECHO 01/2017- EF 45-50% Grade IIDD Left [facility] Severely dilated.   Labs from [facility] 08/01/2017 Hgb 8.9 Cholesterol 89 HDL 38 TG 91 K 4.8 Creatinine 1.0   Review of systems complete and found to be negative unless listed in HPI.    Past Medical History:  Diagnosis Date  . Arthritis   . Blood dyscrasia    per pt-has small blood cells-appears as if anemic  . Cancer (HCC)    breast  . CHF (congestive heart failure) ([place])   . Complication of anesthesia    difficult to awaken from per pt  . Diabetes mellitus without complication ([place])   . Dyspnea   . Essential hypertension   . Heart disease   . Hypothyroidism   . Presence of permanent cardiac pacemaker   . Sleep apnea   . Thyroid disease    hypothyroid    Current Outpatient Medications  Medication Sig Dispense Refill  . allopurinol (ZYLOPRIM) 100 MG tablet Take 100 mg by mouth daily.    Marland Kitchen aspirin 81 MG tablet Take 1 tablet (81 mg total) by mouth daily. 30 tablet 3  . atorvastatin (LIPITOR) 80 MG tablet Take 1 tablet (80 mg total) by mouth at bedtime. 30 tablet 11  . carvedilol (COREG) 25 MG tablet Take 1 tablet (25 mg total) by mouth 2 (two) times daily. 180 tablet 3  . isosorbide mononitrate (IMDUR) 30 MG 24 hr tablet Take  1 tablet (30 mg total) by mouth daily. 30 tablet 3  . levothyroxine (SYNTHROID, LEVOTHROID) 112 MCG tablet Take 112 mcg by mouth daily before breakfast.    . losartan (COZAAR) 50 MG tablet Take 50 mg by mouth daily.    . metFORMIN (GLUCOPHAGE) 500 MG tablet Take 500 mg by mouth 2 (two) times daily with a meal.     . Multiple Vitamins-Minerals (ALIVE ONCE DAILY WOMENS 50+ PO) Take 1 tablet by mouth daily.    Marland Kitchen omeprazole (PRILOSEC) 40 MG capsule Take 40 mg by mouth daily.    . potassium chloride (K-DUR,KLOR-CON) 10 MEQ tablet Take 2 tablets (20 mEq total) by mouth daily. 60 tablet 5  . spironolactone (ALDACTONE) 25 MG tablet Take 1 tablet (25 mg total) by mouth every other day. 15 tablet 5  . ticagrelor (BRILINTA) 90 MG TABS tablet Take 1 tablet (90 mg total) by mouth 2 (two) times daily. 60 tablet 3  . torsemide (DEMADEX) 20 MG tablet Take 20 mg by mouth 2 (two) times daily.     Marland Kitchen acetaminophen (TYLENOL) 650 MG CR tablet Take 650 mg by mouth every 8 (eight) hours as needed for pain.    . Biotin 10 MG CAPS Take 10 mg by mouth daily.    . calcium citrate (CALCITRATE - DOSED IN MG ELEMENTAL CALCIUM) 950  MG tablet Take 1 tablet by mouth 2 (two) times daily.     . CHONDROITIN SULFATE PO Take 250 mg by mouth daily.    . ferrous sulfate 325 (65 FE) MG tablet Take 325 mg by mouth 2 (two) times daily with a meal.    . ibuprofen (ADVIL,MOTRIN) 800 MG tablet Take 800 mg by mouth every 6 (six) hours as needed for mild pain (gout/arthritis pain).    . insulin glargine (LANTUS) 100 UNIT/ML injection Inject 30 Units into the skin 2 (two) times daily.     . insulin lispro (HUMALOG) 100 UNIT/ML injection Inject 5 Units into the skin daily with lunch.     . Lactobacillus (ACIDOPHILUS PO) Take 1 capsule by mouth daily.    . nitroGLYCERIN (NITROSTAT) 0.4 MG SL tablet Place 1 tablet (0.4 mg total) under the tongue every 5 (five) minutes as needed for chest pain. (Patient not taking: Reported on 08/29/2017) 25 tablet 0    . Travoprost, BAK Free, (TRAVATAN) 0.004 % SOLN ophthalmic solution Place 1 drop into both eyes daily.     No current facility-administered medications for this encounter.    Allergies  Allergen Reactions  . Sulfa Antibiotics Rash   Social History   Socioeconomic History  . Marital status: Married    Spouse name: Not on file  . Number of children: Not on file  . Years of education: Not on file  . Highest education level: Not on file  Occupational History  . Not on file  Social Needs  . Financial resource strain: Not on file  . Food insecurity:    Worry: Not on file    Inability: Not on file  . Transportation needs:    Medical: Not on file    Non-medical: Not on file  Tobacco Use  . Smoking status: Former Smoker    Packs/day: 1.00    Years: 22.00    Pack years: 22.00    Types: Cigarettes    Last attempt to quit: 1983    Years since quitting: 36.3  . Smokeless tobacco: Never Used  Substance and Sexual Activity  . Alcohol use: Yes    Comment: ocassional  . Drug use: No  . Sexual activity: Never    Birth control/protection: None, Post-menopausal  Lifestyle  . Physical activity:    Days per week: Not on file    Minutes per session: Not on file  . Stress: Not on file  Relationships  . Social connections:    Talks on phone: Not on file    Gets together: Not on file    Attends religious service: Not on file    Active member of club or organization: Not on file    Attends meetings of clubs or organizations: Not on file    Relationship status: Not on file  . Intimate partner violence:    Fear of current or ex partner: Not on file    Emotionally abused: Not on file    Physically abused: Not on file    Forced sexual activity: Not on file  Other Topics Concern  . Not on file  Social History Narrative  . Not on file   Family History  Problem Relation Age of Onset  . Multiple myeloma Mother   . Heart disease Father   . Other Sister   . Heart disease Brother    . Diabetes Sister   . Other Sister    Vitals:   09/20/17 1123  BP: (!) 148/82  Pulse: 82  SpO2: 96%  Weight: 206 lb 6.4 oz (93.6 kg)   Wt Readings from Last 3 Encounters:  09/20/17 206 lb 6.4 oz (93.6 kg)  09/10/17 207 lb 6.4 oz (94.1 kg)  09/06/17 202 lb 6.4 oz (91.8 kg)      PHYSICAL EXAM: General:  Well appearing. No resp difficulty. Walked slowly in the clinic HEENT: normal Neck: supple. JVP 9-10 . Carotids 2+ bilat; no bruits. No lymphadenopathy or thryomegaly appreciated. Cor: PMI nondisplaced. Regular rate & rhythm. No rubs, gallops or murmurs. Lungs: clear Abdomen: soft, nontender, nondistended. No hepatosplenomegaly. No bruits or masses. Good bowel sounds. Extremities: no cyanosis, clubbing, rash, R and LLE 1-2edema Neuro: alert & orientedx3, cranial nerves grossly intact. moves all 4 extremities w/o difficulty. Affect pleasant  ASSESSMENT & PLAN:  1. Chronic systolic CHF: ICM, Echo 06/15/12 EF 45-50%, LA severely dilated, restrictive CM pattern.  - NYHA II-III - Volume status stable. Increase torsemide to 40 mg/20 mg  - Continue imdur 30 mg daily - Continue losartan  50 mg daily. - Continue 25 mg spironolactone daily.  - Continue Coreg 25 mg twice a day.      2. CAD: Stenting in 2010, 2012 and 2016. Records pending from [place]. No s/s ischemia  - Continue ASA and statin.   3. HTN - Stable.     4. DM - Follows with Dr. Delfina Redwood. Sees in several weeks.   5. PPM - Dr. Curt Bears now following.   6. OSA -Had Sleep study. CPAP start pending.   7. Gout - Treated per PCP.  - Has follow up with Rheumatology   8. Arthritis - Continue tylenol. Further per PCP.   9. OSA-  Continue CPAP   Check BMET today. Today she was referred to HF Exercise Physiologist for home exercises.  Follow up in 2 months.    Danielle Grinder, NP 09/20/17

## 2017-09-20 NOTE — Progress Notes (Signed)
Patient referred to Clinical Exercise Physiologist by Darrick Grinder for guidance and discussion about safe home exercises and/or starting an exercise program. Exercises were demonstrated and detailed with safety precautions to patient and accompanying caregiver (if present). Patient was presented with an information packet including demonstrations of the exercises discussed. All patient's questions were answered and patient was given contact information for further questions or concerns regarding their exercise.    Landis Martins, MS, ACSM-RCEP Clinical Exercise Physiologist

## 2017-09-20 NOTE — Progress Notes (Signed)
Paramedicine Encounter   Patient ID: Danielle [name] , female,   DOB: 1946/03/03,72 y.o.,  MRN: [id number]  [name] was seen at the HF clinic today and reported having a lot of pain secondary to her gout. [name] is adding an additional 20 mg of torsemide in the morning for a total of 40 mg in the morning and 20 mg in the evening. She is still without omeprazole. [name] has requested information on working out so [name] brought in [place] who gave her a handout with several exercises. No other changes were made today.   Time spent with patient: 25 minutes  [name], EMT 09/20/2017   ACTION: Next visit planned for 1 week

## 2017-09-25 DIAGNOSIS — G4733 Obstructive sleep apnea (adult) (pediatric): Secondary | ICD-10-CM | POA: Diagnosis not present

## 2017-09-26 ENCOUNTER — Telehealth (HOSPITAL_COMMUNITY): Payer: Self-pay

## 2017-09-26 DIAGNOSIS — M2550 Pain in unspecified joint: Secondary | ICD-10-CM | POA: Diagnosis not present

## 2017-09-26 DIAGNOSIS — Z6834 Body mass index (BMI) 34.0-34.9, adult: Secondary | ICD-10-CM | POA: Diagnosis not present

## 2017-09-26 DIAGNOSIS — M1A09X1 Idiopathic chronic gout, multiple sites, with tophus (tophi): Secondary | ICD-10-CM | POA: Diagnosis not present

## 2017-09-26 DIAGNOSIS — E669 Obesity, unspecified: Secondary | ICD-10-CM | POA: Diagnosis not present

## 2017-09-26 NOTE — Telephone Encounter (Signed)
I called Danielle [name] to schedule an appointment. She stated that she was at her doctor's office at that time and waiting on her ride to bring her home. She requested I come to her home tomorrow afternoon so we agreed to meet tomorrow at 13:30.

## 2017-09-27 ENCOUNTER — Other Ambulatory Visit (HOSPITAL_COMMUNITY): Payer: Self-pay

## 2017-09-27 ENCOUNTER — Other Ambulatory Visit (HOSPITAL_COMMUNITY): Payer: Self-pay | Admitting: Pharmacist

## 2017-09-27 MED ORDER — TICAGRELOR 90 MG PO TABS: 90.0000 mg | ORAL_TABLET | Freq: Two times a day (BID) | ORAL | 5 refills | Status: DC

## 2017-09-27 NOTE — Progress Notes (Signed)
Paramedicine Encounter    Patient ID: Danielle [name], female    DOB: 01/14/1946, 72 y.o.   MRN: [id number]   Patient Care Team: Seward Carol, MD as PCP - General (Internal Medicine)  Patient Active Problem List   Diagnosis Date Noted  . OSA (obstructive sleep apnea) 02/18/2017  . Acute combined systolic and diastolic congestive heart failure ([place]) 02/05/2017  . Essential hypertension   . Diabetes mellitus without complication ([place])   . Cancer ([place])   . Thyroid disease     Current Outpatient Medications:  .  acetaminophen (TYLENOL) 650 MG CR tablet, Take 650 mg by mouth every 8 (eight) hours as needed for pain., Disp: , Rfl:  .  allopurinol (ZYLOPRIM) 100 MG tablet, Take 100 mg by mouth daily., Disp: , Rfl:  .  aspirin 81 MG tablet, Take 1 tablet (81 mg total) by mouth daily., Disp: 30 tablet, Rfl: 3 .  atorvastatin (LIPITOR) 80 MG tablet, Take 1 tablet (80 mg total) by mouth at bedtime., Disp: 30 tablet, Rfl: 11 .  Biotin 10 MG CAPS, Take 10 mg by mouth daily., Disp: , Rfl:  .  carvedilol (COREG) 25 MG tablet, Take 1 tablet (25 mg total) by mouth 2 (two) times daily., Disp: 180 tablet, Rfl: 3 .  ferrous sulfate 325 (65 FE) MG tablet, Take 325 mg by mouth 2 (two) times daily with a meal., Disp: , Rfl:  .  ibuprofen (ADVIL,MOTRIN) 800 MG tablet, Take 800 mg by mouth every 6 (six) hours as needed for mild pain (gout/arthritis pain)., Disp: , Rfl:  .  insulin glargine (LANTUS) 100 UNIT/ML injection, Inject 30 Units into the skin 2 (two) times daily. , Disp: , Rfl:  .  insulin lispro (HUMALOG) 100 UNIT/ML injection, Inject 5 Units into the skin daily with lunch. , Disp: , Rfl:  .  isosorbide mononitrate (IMDUR) 30 MG 24 hr tablet, Take 1 tablet (30 mg total) by mouth daily., Disp: 30 tablet, Rfl: 3 .  levothyroxine (SYNTHROID, LEVOTHROID) 112 MCG tablet, Take 112 mcg by mouth daily before breakfast., Disp: , Rfl:  .  losartan (COZAAR) 50 MG tablet, Take 50 mg by mouth daily., Disp: , Rfl:   .  metFORMIN (GLUCOPHAGE) 500 MG tablet, Take 500 mg by mouth 2 (two) times daily with a meal. , Disp: , Rfl:  .  Multiple Vitamins-Minerals (ALIVE ONCE DAILY WOMENS 50+ PO), Take 1 tablet by mouth daily., Disp: , Rfl:  .  omeprazole (PRILOSEC) 40 MG capsule, Take 40 mg by mouth daily., Disp: , Rfl:  .  potassium chloride (K-DUR,KLOR-CON) 10 MEQ tablet, Take 2 tablets (20 mEq total) by mouth daily., Disp: 60 tablet, Rfl: 5 .  spironolactone (ALDACTONE) 25 MG tablet, Take 1 tablet (25 mg total) by mouth every other day., Disp: 15 tablet, Rfl: 5 .  torsemide (DEMADEX) 20 MG tablet, Take 40 mg (2 tabs) in am and 20 mg (1 tab) in pm., Disp: 90 tablet, Rfl: 11 .  Travoprost, BAK Free, (TRAVATAN) 0.004 % SOLN ophthalmic solution, Place 1 drop into both eyes daily., Disp: , Rfl:  .  calcium citrate (CALCITRATE - DOSED IN MG ELEMENTAL CALCIUM) 950 MG tablet, Take 1 tablet by mouth 2 (two) times daily. , Disp: , Rfl:  .  CHONDROITIN SULFATE PO, Take 250 mg by mouth daily., Disp: , Rfl:  .  Lactobacillus (ACIDOPHILUS PO), Take 1 capsule by mouth daily., Disp: , Rfl:  .  nitroGLYCERIN (NITROSTAT) 0.4 MG SL tablet, Place 1 tablet (  0.4 mg total) under the tongue every 5 (five) minutes as needed for chest pain. (Patient not taking: Reported on 08/29/2017), Disp: 25 tablet, Rfl: 0 .  ticagrelor (BRILINTA) 90 MG TABS tablet, Take 1 tablet (90 mg total) by mouth 2 (two) times daily., Disp: 60 tablet, Rfl: 5 Allergies  Allergen Reactions  . Sulfa Antibiotics Rash      Social History   Socioeconomic History  . Marital status: Married    Spouse name: Not on file  . Number of children: Not on file  . Years of education: Not on file  . Highest education level: Not on file  Occupational History  . Not on file  Social Needs  . Financial resource strain: Not on file  . Food insecurity:    Worry: Not on file    Inability: Not on file  . Transportation needs:    Medical: Not on file    Non-medical: Not on  file  Tobacco Use  . Smoking status: Former Smoker    Packs/day: 1.00    Years: 22.00    Pack years: 22.00    Types: Cigarettes    Last attempt to quit: 1983    Years since quitting: 36.3  . Smokeless tobacco: Never Used  Substance and Sexual Activity  . Alcohol use: Yes    Comment: ocassional  . Drug use: No  . Sexual activity: Never    Birth control/protection: None, Post-menopausal  Lifestyle  . Physical activity:    Days per week: Not on file    Minutes per session: Not on file  . Stress: Not on file  Relationships  . Social connections:    Talks on phone: Not on file    Gets together: Not on file    Attends religious service: Not on file    Active member of club or organization: Not on file    Attends meetings of clubs or organizations: Not on file    Relationship status: Not on file  . Intimate partner violence:    Fear of current or ex partner: Not on file    Emotionally abused: Not on file    Physically abused: Not on file    Forced sexual activity: Not on file  Other Topics Concern  . Not on file  Social History Narrative  . Not on file    Physical Exam  Constitutional: She is oriented to person, place, and time.  Cardiovascular: Normal rate and regular rhythm.  Pulmonary/Chest: Effort normal and breath sounds normal.  Abdominal: Soft. She exhibits no distension.  Musculoskeletal: Normal range of motion. She exhibits edema.  Neurological: She is alert and oriented to person, place, and time.  Skin: Skin is warm and dry.  Psychiatric: She has a normal mood and affect.        Future Appointments  Date Time Provider Home  11/11/2017  9:00 AM Sueanne Margarita, MD [facility]  11/15/2017 11:30 AM [facility] PA/NP [facility] None  11/19/2017  7:20 AM [facility] DEVICE REMOTES [facility]    BP 118/60 (BP Location: Right Arm, Patient Position: Sitting, Cuff Size: Large)   Pulse 76   Resp 16   Wt 202 lb (91.6 kg)   SpO2  98%   BMI 34.67 kg/m   Weight yesterday- 203 lb Last visit weight- 206 lb  Ms Poch was seen at home today and reported feeling generally well. She denied SOB, headache, dizziness or orthopnea. She stated her appetite has been decreased lately but reports  using Slim Fast and banana smoothies as a breakfast replacement. She reported being compliant with hear medications which were verified and her pillbox was refilled. She ran out of Brilinta while I was filling her pillbox and was out of refills. I contacted Doroteo Bradford at the HF clinic who advised she would send in a new Rx immediately. Ms Castleman has Brilinta through Saturday evening but will need to add it to her pillbox after that. She is aware and knows where the medication should be put when she gets it from the pharmacy. I spent some extra time with her today putting together a shelving unit and moving some things for her.   Time spent with patient: 87 minutes  [name], EMT 09/27/17  ACTION: Home visit completed Next visit planned for 1 week

## 2017-10-01 DIAGNOSIS — E039 Hypothyroidism, unspecified: Secondary | ICD-10-CM | POA: Diagnosis not present

## 2017-10-01 DIAGNOSIS — G4730 Sleep apnea, unspecified: Secondary | ICD-10-CM | POA: Diagnosis not present

## 2017-10-01 DIAGNOSIS — E7800 Pure hypercholesterolemia, unspecified: Secondary | ICD-10-CM | POA: Diagnosis not present

## 2017-10-01 DIAGNOSIS — R600 Localized edema: Secondary | ICD-10-CM | POA: Diagnosis not present

## 2017-10-01 DIAGNOSIS — I2510 Atherosclerotic heart disease of native coronary artery without angina pectoris: Secondary | ICD-10-CM | POA: Diagnosis not present

## 2017-10-01 DIAGNOSIS — M109 Gout, unspecified: Secondary | ICD-10-CM | POA: Diagnosis not present

## 2017-10-01 DIAGNOSIS — D509 Iron deficiency anemia, unspecified: Secondary | ICD-10-CM | POA: Diagnosis not present

## 2017-10-01 DIAGNOSIS — M62838 Other muscle spasm: Secondary | ICD-10-CM | POA: Diagnosis not present

## 2017-10-01 DIAGNOSIS — E1140 Type 2 diabetes mellitus with diabetic neuropathy, unspecified: Secondary | ICD-10-CM | POA: Diagnosis not present

## 2017-10-03 DIAGNOSIS — M1711 Unilateral primary osteoarthritis, right knee: Secondary | ICD-10-CM | POA: Diagnosis not present

## 2017-10-04 ENCOUNTER — Other Ambulatory Visit: Payer: Self-pay | Admitting: Obstetrics and Gynecology

## 2017-10-04 ENCOUNTER — Other Ambulatory Visit (HOSPITAL_COMMUNITY): Payer: Self-pay

## 2017-10-04 DIAGNOSIS — Z1231 Encounter for screening mammogram for malignant neoplasm of breast: Secondary | ICD-10-CM

## 2017-10-04 NOTE — Progress Notes (Signed)
Paramedicine Encounter    Patient ID: Danielle [name], female    DOB: April 15, 1946, 72 y.o.   MRN: [id number]   Patient Care Team: Seward Carol, MD as PCP - General (Internal Medicine)  Patient Active Problem List   Diagnosis Date Noted  . OSA (obstructive sleep apnea) 02/18/2017  . Acute combined systolic and diastolic congestive heart failure ([place]) 02/05/2017  . Essential hypertension   . Diabetes mellitus without complication ([place])   . Cancer ([place])   . Thyroid disease     Current Outpatient Medications:  .  acetaminophen (TYLENOL) 650 MG CR tablet, Take 650 mg by mouth every 8 (eight) hours as needed for pain., Disp: , Rfl:  .  allopurinol (ZYLOPRIM) 100 MG tablet, Take 100 mg by mouth daily., Disp: , Rfl:  .  aspirin 81 MG tablet, Take 1 tablet (81 mg total) by mouth daily., Disp: 30 tablet, Rfl: 3 .  atorvastatin (LIPITOR) 80 MG tablet, Take 1 tablet (80 mg total) by mouth at bedtime., Disp: 30 tablet, Rfl: 11 .  Biotin 10 MG CAPS, Take 10 mg by mouth daily., Disp: , Rfl:  .  carvedilol (COREG) 25 MG tablet, Take 1 tablet (25 mg total) by mouth 2 (two) times daily., Disp: 180 tablet, Rfl: 3 .  ferrous sulfate 325 (65 FE) MG tablet, Take 325 mg by mouth 2 (two) times daily with a meal., Disp: , Rfl:  .  insulin glargine (LANTUS) 100 UNIT/ML injection, Inject 30 Units into the skin 2 (two) times daily. , Disp: , Rfl:  .  insulin lispro (HUMALOG) 100 UNIT/ML injection, Inject 5 Units into the skin daily with lunch. , Disp: , Rfl:  .  isosorbide mononitrate (IMDUR) 30 MG 24 hr tablet, Take 1 tablet (30 mg total) by mouth daily., Disp: 30 tablet, Rfl: 3 .  levothyroxine (SYNTHROID, LEVOTHROID) 112 MCG tablet, Take 112 mcg by mouth daily before breakfast., Disp: , Rfl:  .  losartan (COZAAR) 50 MG tablet, Take 50 mg by mouth daily., Disp: , Rfl:  .  metFORMIN (GLUCOPHAGE) 500 MG tablet, Take 500 mg by mouth 2 (two) times daily with a meal. , Disp: , Rfl:  .  Multiple Vitamins-Minerals  (ALIVE ONCE DAILY WOMENS 50+ PO), Take 1 tablet by mouth daily., Disp: , Rfl:  .  omeprazole (PRILOSEC) 40 MG capsule, Take 40 mg by mouth daily., Disp: , Rfl:  .  potassium chloride (K-DUR,KLOR-CON) 10 MEQ tablet, Take 2 tablets (20 mEq total) by mouth daily., Disp: 60 tablet, Rfl: 5 .  spironolactone (ALDACTONE) 25 MG tablet, Take 1 tablet (25 mg total) by mouth every other day., Disp: 15 tablet, Rfl: 5 .  ticagrelor (BRILINTA) 90 MG TABS tablet, Take 1 tablet (90 mg total) by mouth 2 (two) times daily., Disp: 60 tablet, Rfl: 5 .  torsemide (DEMADEX) 20 MG tablet, Take 40 mg (2 tabs) in am and 20 mg (1 tab) in pm., Disp: 90 tablet, Rfl: 11 .  Travoprost, BAK Free, (TRAVATAN) 0.004 % SOLN ophthalmic solution, Place 1 drop into both eyes daily., Disp: , Rfl:  .  calcium citrate (CALCITRATE - DOSED IN MG ELEMENTAL CALCIUM) 950 MG tablet, Take 1 tablet by mouth 2 (two) times daily. , Disp: , Rfl:  .  CHONDROITIN SULFATE PO, Take 250 mg by mouth daily., Disp: , Rfl:  .  ibuprofen (ADVIL,MOTRIN) 800 MG tablet, Take 800 mg by mouth every 6 (six) hours as needed for mild pain (gout/arthritis pain)., Disp: , Rfl:  .  Lactobacillus (ACIDOPHILUS PO), Take 1 capsule by mouth daily., Disp: , Rfl:  .  nitroGLYCERIN (NITROSTAT) 0.4 MG SL tablet, Place 1 tablet (0.4 mg total) under the tongue every 5 (five) minutes as needed for chest pain. (Patient not taking: Reported on 08/29/2017), Disp: 25 tablet, Rfl: 0 Allergies  Allergen Reactions  . Sulfa Antibiotics Rash      Social History   Socioeconomic History  . Marital status: Married    Spouse name: Not on file  . Number of children: Not on file  . Years of education: Not on file  . Highest education level: Not on file  Occupational History  . Not on file  Social Needs  . Financial resource strain: Not on file  . Food insecurity:    Worry: Not on file    Inability: Not on file  . Transportation needs:    Medical: Not on file    Non-medical: Not  on file  Tobacco Use  . Smoking status: Former Smoker    Packs/day: 1.00    Years: 22.00    Pack years: 22.00    Types: Cigarettes    Last attempt to quit: 1983    Years since quitting: 36.3  . Smokeless tobacco: Never Used  Substance and Sexual Activity  . Alcohol use: Yes    Comment: ocassional  . Drug use: No  . Sexual activity: Never    Birth control/protection: None, Post-menopausal  Lifestyle  . Physical activity:    Days per week: Not on file    Minutes per session: Not on file  . Stress: Not on file  Relationships  . Social connections:    Talks on phone: Not on file    Gets together: Not on file    Attends religious service: Not on file    Active member of club or organization: Not on file    Attends meetings of clubs or organizations: Not on file    Relationship status: Not on file  . Intimate partner violence:    Fear of current or ex partner: Not on file    Emotionally abused: Not on file    Physically abused: Not on file    Forced sexual activity: Not on file  Other Topics Concern  . Not on file  Social History Narrative  . Not on file    Physical Exam  Constitutional: She is oriented to person, place, and time.  Cardiovascular: Normal rate and regular rhythm.  Pulmonary/Chest: Effort normal and breath sounds normal. No respiratory distress.  Abdominal: Soft. She exhibits no distension.  Musculoskeletal: Normal range of motion. She exhibits edema.  Neurological: She is alert and oriented to person, place, and time.  Skin: Skin is warm and dry.  Psychiatric: She has a normal mood and affect.        Future Appointments  Date Time Provider War  11/05/2017 10:50 AM [facility] 3 [facility]  11/11/2017  9:00 AM Sueanne Margarita, MD [facility]  11/15/2017 11:30 AM [facility] PA/NP [facility] None  11/19/2017  7:20 AM [facility] DEVICE REMOTES [facility]    BP 110/60 (BP Location: Right Arm, Patient  Position: Sitting, Cuff Size: Large)   Pulse 70   Resp 16   Wt 199 lb 12.8 oz (90.6 kg)   SpO2 98%   BMI 34.30 kg/m   Weight yesterday- 201 lb Last visit weight- 202 lb  Ms Brokaw was seen at home today and reported feeling generally well. She denied SOB,  headache, dizziness or orthopnea. She stated she has been compliant with her medications and eating properly. She had an injection into her knee for arthritic pain and her orthopedist has suggested the need for a full knee replacement. She said she wants to speak to doctor Bensimhon regarding this but will not have another appointment until mid June. I advised I would let them know that this is a question she has and see if they can have some information for her at her appointment. He medications were verified and her pillbox was refilled.   Time spent with patient: 37 minutes  [name], EMT 10/04/17  ACTION: Home visit completed Next visit planned for 1 week

## 2017-10-10 ENCOUNTER — Other Ambulatory Visit (HOSPITAL_COMMUNITY): Payer: Self-pay | Admitting: *Deleted

## 2017-10-10 ENCOUNTER — Telehealth (HOSPITAL_COMMUNITY): Payer: Self-pay

## 2017-10-10 ENCOUNTER — Other Ambulatory Visit (HOSPITAL_COMMUNITY): Payer: Self-pay | Admitting: Pharmacist

## 2017-10-10 ENCOUNTER — Other Ambulatory Visit (HOSPITAL_COMMUNITY): Payer: Self-pay

## 2017-10-10 MED ORDER — FEBUXOSTAT 40 MG PO TABS: 40.0000 mg | ORAL_TABLET | Freq: Every day | ORAL | 0 refills | Status: DC

## 2017-10-10 MED ORDER — LOSARTAN POTASSIUM 50 MG PO TABS: 50.0000 mg | ORAL_TABLET | Freq: Every day | ORAL | 3 refills | Status: DC

## 2017-10-10 NOTE — Telephone Encounter (Signed)
I called Danielle [name] to schedule an appointment. She stated she was available to meet this afternoon at 13:00. She also had some questions concerning a new medication from the rheumatologist but I told her we would have to contact them when I get to her house.

## 2017-10-10 NOTE — Progress Notes (Signed)
Paramedicine Encounter    Patient ID: Danielle [name], female    DOB: 02-Mar-1946, 72 y.o.   MRN: [id number]   Patient Care Team: Seward Carol, MD as PCP - General (Internal Medicine)  Patient Active Problem List   Diagnosis Date Noted  . OSA (obstructive sleep apnea) 02/18/2017  . Acute combined systolic and diastolic congestive heart failure ([place]) 02/05/2017  . Essential hypertension   . Diabetes mellitus without complication ([place])   . Cancer ([place])   . Thyroid disease     Current Outpatient Medications:  .  acetaminophen (TYLENOL) 650 MG CR tablet, Take 650 mg by mouth every 8 (eight) hours as needed for pain., Disp: , Rfl:  .  aspirin 81 MG tablet, Take 1 tablet (81 mg total) by mouth daily., Disp: 30 tablet, Rfl: 3 .  atorvastatin (LIPITOR) 80 MG tablet, Take 1 tablet (80 mg total) by mouth at bedtime., Disp: 30 tablet, Rfl: 11 .  Biotin 10 MG CAPS, Take 10 mg by mouth daily., Disp: , Rfl:  .  carvedilol (COREG) 25 MG tablet, Take 1 tablet (25 mg total) by mouth 2 (two) times daily., Disp: 180 tablet, Rfl: 3 .  ferrous sulfate 325 (65 FE) MG tablet, Take 325 mg by mouth 2 (two) times daily with a meal., Disp: , Rfl:  .  insulin glargine (LANTUS) 100 UNIT/ML injection, Inject 30 Units into the skin 2 (two) times daily. , Disp: , Rfl:  .  insulin lispro (HUMALOG) 100 UNIT/ML injection, Inject 5 Units into the skin daily with lunch. , Disp: , Rfl:  .  isosorbide mononitrate (IMDUR) 30 MG 24 hr tablet, Take 1 tablet (30 mg total) by mouth daily., Disp: 30 tablet, Rfl: 3 .  levothyroxine (SYNTHROID, LEVOTHROID) 112 MCG tablet, Take 112 mcg by mouth daily before breakfast., Disp: , Rfl:  .  losartan (COZAAR) 50 MG tablet, Take 50 mg by mouth daily., Disp: , Rfl:  .  metFORMIN (GLUCOPHAGE) 500 MG tablet, Take 500 mg by mouth 2 (two) times daily with a meal. , Disp: , Rfl:  .  Multiple Vitamins-Minerals (ALIVE ONCE DAILY WOMENS 50+ PO), Take 1 tablet by mouth daily., Disp: , Rfl:  .   omeprazole (PRILOSEC) 40 MG capsule, Take 40 mg by mouth daily., Disp: , Rfl:  .  potassium chloride (K-DUR,KLOR-CON) 10 MEQ tablet, Take 2 tablets (20 mEq total) by mouth daily., Disp: 60 tablet, Rfl: 5 .  spironolactone (ALDACTONE) 25 MG tablet, Take 1 tablet (25 mg total) by mouth every other day., Disp: 15 tablet, Rfl: 5 .  ticagrelor (BRILINTA) 90 MG TABS tablet, Take 1 tablet (90 mg total) by mouth 2 (two) times daily., Disp: 60 tablet, Rfl: 5 .  torsemide (DEMADEX) 20 MG tablet, Take 40 mg (2 tabs) in am and 20 mg (1 tab) in pm., Disp: 90 tablet, Rfl: 11 .  Travoprost, BAK Free, (TRAVATAN) 0.004 % SOLN ophthalmic solution, Place 1 drop into both eyes daily., Disp: , Rfl:  .  allopurinol (ZYLOPRIM) 100 MG tablet, Take 100 mg by mouth daily., Disp: , Rfl:  .  calcium citrate (CALCITRATE - DOSED IN MG ELEMENTAL CALCIUM) 950 MG tablet, Take 1 tablet by mouth 2 (two) times daily. , Disp: , Rfl:  .  CHONDROITIN SULFATE PO, Take 250 mg by mouth daily., Disp: , Rfl:  .  ibuprofen (ADVIL,MOTRIN) 800 MG tablet, Take 800 mg by mouth every 6 (six) hours as needed for mild pain (gout/arthritis pain)., Disp: , Rfl:  .  Lactobacillus (ACIDOPHILUS PO), Take 1 capsule by mouth daily., Disp: , Rfl:  .  nitroGLYCERIN (NITROSTAT) 0.4 MG SL tablet, Place 1 tablet (0.4 mg total) under the tongue every 5 (five) minutes as needed for chest pain. (Patient not taking: Reported on 08/29/2017), Disp: 25 tablet, Rfl: 0 Allergies  Allergen Reactions  . Sulfa Antibiotics Rash      Social History   Socioeconomic History  . Marital status: Married    Spouse name: Not on file  . Number of children: Not on file  . Years of education: Not on file  . Highest education level: Not on file  Occupational History  . Not on file  Social Needs  . Financial resource strain: Not on file  . Food insecurity:    Worry: Not on file    Inability: Not on file  . Transportation needs:    Medical: Not on file    Non-medical: Not  on file  Tobacco Use  . Smoking status: Former Smoker    Packs/day: 1.00    Years: 22.00    Pack years: 22.00    Types: Cigarettes    Last attempt to quit: 1983    Years since quitting: 36.3  . Smokeless tobacco: Never Used  Substance and Sexual Activity  . Alcohol use: Yes    Comment: ocassional  . Drug use: No  . Sexual activity: Never    Birth control/protection: None, Post-menopausal  Lifestyle  . Physical activity:    Days per week: Not on file    Minutes per session: Not on file  . Stress: Not on file  Relationships  . Social connections:    Talks on phone: Not on file    Gets together: Not on file    Attends religious service: Not on file    Active member of club or organization: Not on file    Attends meetings of clubs or organizations: Not on file    Relationship status: Not on file  . Intimate partner violence:    Fear of current or ex partner: Not on file    Emotionally abused: Not on file    Physically abused: Not on file    Forced sexual activity: Not on file  Other Topics Concern  . Not on file  Social History Narrative  . Not on file    Physical Exam  Constitutional: She is oriented to person, place, and time.  Cardiovascular: Normal rate and regular rhythm.  Pulmonary/Chest: Effort normal and breath sounds normal.  Abdominal: Soft. She exhibits no distension.  Musculoskeletal: Normal range of motion. She exhibits edema.  Neurological: She is alert and oriented to person, place, and time.  Skin: Skin is warm and dry.  Psychiatric: She has a normal mood and affect.        Future Appointments  Date Time Provider [place]  11/05/2017 10:50 AM [facility] MM 3 [facility]  11/11/2017  9:00 AM Sueanne Margarita, MD [facility]  11/15/2017 11:30 AM [facility] PA/NP [facility] None  11/19/2017  7:20 AM [facility] DEVICE REMOTES [facility]    BP 104/60 (BP Location: Right Arm, Patient Position: Sitting, Cuff Size:  Large)   Pulse 78   Resp 16   Wt 196 lb 12.8 oz (89.3 kg)   SpO2 98%   BMI 33.78 kg/m   Weight yesterday- did not weigh Last visit weight- 199 lb   Ms Avilla was seen at home today and reported feeling well. She denied SOB, headache, dizziness  or orthopnea. She has been compliant with her medications which were verified and refilled. She ran out of Metformin and losartan but both were called in and she was left with detailed instruction on where they should be put when she picks them up from the pharmacy.  Time spent with patient: 38 minutes  [name], EMT 10/10/17  ACTION: Home visit completed Next visit planned for 1 week

## 2017-10-18 ENCOUNTER — Other Ambulatory Visit (HOSPITAL_COMMUNITY): Payer: Self-pay

## 2017-10-18 NOTE — Progress Notes (Signed)
Paramedicine Encounter    Patient ID: Danielle [name], female    DOB: 29-Dec-1945, 72 y.o.   MRN: [id number]   Patient Care Team: Seward Carol, MD as PCP - General (Internal Medicine)  Patient Active Problem List   Diagnosis Date Noted  . OSA (obstructive sleep apnea) 02/18/2017  . Acute combined systolic and diastolic congestive heart failure ([place]) 02/05/2017  . Essential hypertension   . Diabetes mellitus without complication ([place])   . Cancer ([place])   . Thyroid disease     Current Outpatient Medications:  .  acetaminophen (TYLENOL) 650 MG CR tablet, Take 650 mg by mouth every 8 (eight) hours as needed for pain., Disp: , Rfl:  .  aspirin 81 MG tablet, Take 1 tablet (81 mg total) by mouth daily., Disp: 30 tablet, Rfl: 3 .  atorvastatin (LIPITOR) 80 MG tablet, Take 1 tablet (80 mg total) by mouth at bedtime., Disp: 30 tablet, Rfl: 11 .  Biotin 10 MG CAPS, Take 10 mg by mouth daily., Disp: , Rfl:  .  carvedilol (COREG) 25 MG tablet, Take 1 tablet (25 mg total) by mouth 2 (two) times daily., Disp: 180 tablet, Rfl: 3 .  febuxostat (ULORIC) 40 MG tablet, Take 1 tablet (40 mg total) by mouth daily., Disp: 30 tablet, Rfl: 0 .  ferrous sulfate 325 (65 FE) MG tablet, Take 325 mg by mouth 2 (two) times daily with a meal., Disp: , Rfl:  .  insulin glargine (LANTUS) 100 UNIT/ML injection, Inject 30 Units into the skin 2 (two) times daily. , Disp: , Rfl:  .  insulin lispro (HUMALOG) 100 UNIT/ML injection, Inject 5 Units into the skin daily with lunch. , Disp: , Rfl:  .  isosorbide mononitrate (IMDUR) 30 MG 24 hr tablet, Take 1 tablet (30 mg total) by mouth daily., Disp: 30 tablet, Rfl: 3 .  levothyroxine (SYNTHROID, LEVOTHROID) 112 MCG tablet, Take 112 mcg by mouth daily before breakfast., Disp: , Rfl:  .  losartan (COZAAR) 50 MG tablet, Take 1 tablet (50 mg total) by mouth daily., Disp: 30 tablet, Rfl: 3 .  Multiple Vitamins-Minerals (ALIVE ONCE DAILY WOMENS 50+ PO), Take 1 tablet by mouth daily.,  Disp: , Rfl:  .  omeprazole (PRILOSEC) 40 MG capsule, Take 40 mg by mouth daily., Disp: , Rfl:  .  potassium chloride (K-DUR,KLOR-CON) 10 MEQ tablet, Take 2 tablets (20 mEq total) by mouth daily., Disp: 60 tablet, Rfl: 5 .  spironolactone (ALDACTONE) 25 MG tablet, Take 1 tablet (25 mg total) by mouth every other day., Disp: 15 tablet, Rfl: 5 .  ticagrelor (BRILINTA) 90 MG TABS tablet, Take 1 tablet (90 mg total) by mouth 2 (two) times daily., Disp: 60 tablet, Rfl: 5 .  torsemide (DEMADEX) 20 MG tablet, Take 40 mg (2 tabs) in am and 20 mg (1 tab) in pm., Disp: 90 tablet, Rfl: 11 .  Travoprost, BAK Free, (TRAVATAN) 0.004 % SOLN ophthalmic solution, Place 1 drop into both eyes daily., Disp: , Rfl:  .  calcium citrate (CALCITRATE - DOSED IN MG ELEMENTAL CALCIUM) 950 MG tablet, Take 1 tablet by mouth 2 (two) times daily. , Disp: , Rfl:  .  CHONDROITIN SULFATE PO, Take 250 mg by mouth daily., Disp: , Rfl:  .  ibuprofen (ADVIL,MOTRIN) 800 MG tablet, Take 800 mg by mouth every 6 (six) hours as needed for mild pain (gout/arthritis pain)., Disp: , Rfl:  .  Lactobacillus (ACIDOPHILUS PO), Take 1 capsule by mouth daily., Disp: , Rfl:  .  metFORMIN (GLUCOPHAGE) 500 MG tablet, Take 500 mg by mouth 2 (two) times daily with a meal. , Disp: , Rfl:  .  nitroGLYCERIN (NITROSTAT) 0.4 MG SL tablet, Place 1 tablet (0.4 mg total) under the tongue every 5 (five) minutes as needed for chest pain. (Patient not taking: Reported on 08/29/2017), Disp: 25 tablet, Rfl: 0 Allergies  Allergen Reactions  . Sulfa Antibiotics Rash      Social History   Socioeconomic History  . Marital status: Married    Spouse name: Not on file  . Number of children: Not on file  . Years of education: Not on file  . Highest education level: Not on file  Occupational History  . Not on file  Social Needs  . Financial resource strain: Not on file  . Food insecurity:    Worry: Not on file    Inability: Not on file  . Transportation needs:     Medical: Not on file    Non-medical: Not on file  Tobacco Use  . Smoking status: Former Smoker    Packs/day: 1.00    Years: 22.00    Pack years: 22.00    Types: Cigarettes    Last attempt to quit: 1983    Years since quitting: 36.4  . Smokeless tobacco: Never Used  Substance and Sexual Activity  . Alcohol use: Yes    Comment: ocassional  . Drug use: No  . Sexual activity: Never    Birth control/protection: None, Post-menopausal  Lifestyle  . Physical activity:    Days per week: Not on file    Minutes per session: Not on file  . Stress: Not on file  Relationships  . Social connections:    Talks on phone: Not on file    Gets together: Not on file    Attends religious service: Not on file    Active member of club or organization: Not on file    Attends meetings of clubs or organizations: Not on file    Relationship status: Not on file  . Intimate partner violence:    Fear of current or ex partner: Not on file    Emotionally abused: Not on file    Physically abused: Not on file    Forced sexual activity: Not on file  Other Topics Concern  . Not on file  Social History Narrative  . Not on file    Physical Exam  Constitutional: She is oriented to person, place, and time.  Cardiovascular: Normal rate and regular rhythm.  Pulmonary/Chest: Effort normal and breath sounds normal.  Abdominal: Soft. She exhibits no distension.  Musculoskeletal: Normal range of motion. She exhibits edema.  Neurological: She is alert and oriented to person, place, and time.  Skin: Skin is warm and dry.  Psychiatric: She has a normal mood and affect.        Future Appointments  Date Time Provider [place]  11/11/2017  9:00 AM Sueanne Margarita, MD [facility]  11/13/2017 12:50 PM [facility] MM 3 [facility]  11/15/2017 11:30 AM [facility] PA/NP [facility] None  11/19/2017  7:20 AM [facility] DEVICE REMOTES [facility]    BP (!) 110/58 (BP Location:  Right Arm, Patient Position: Sitting, Cuff Size: Large)   Pulse 74   Resp 16   Wt 193 lb (87.5 kg)   SpO2 98%   BMI 33.13 kg/m   Weight yesterday- 195.7 lb Last visit weight- 196 lb  [name] was seen at home today and reported being  in a lot of pain secondary to a gout flare. She has recently been started on Uloric and taken off Allopurinol. She has spoke to her rheumatologist who placed her on a 10 day regimen of prednisone. She is currently out of metformin because the pharmacy does not have her current prescription and therefore will not refill it until July. I called her PCP but had to leave a voicemail requesting a call back. The only other medications she ran out of is isosorbide but it has been ordered from the pharmacy and she was left with instructions on how to put it in her pillbox when she picks it up. Other necessary medications were ordered from the pharmacy without issue.  Time spent with patient: 58 minutes  [name], EMT 10/18/17  ACTION: Home visit completed Next visit planned for 1 week

## 2017-10-22 DIAGNOSIS — E113493 Type 2 diabetes mellitus with severe nonproliferative diabetic retinopathy without macular edema, bilateral: Secondary | ICD-10-CM | POA: Diagnosis not present

## 2017-10-22 DIAGNOSIS — H3561 Retinal hemorrhage, right eye: Secondary | ICD-10-CM | POA: Diagnosis not present

## 2017-10-22 DIAGNOSIS — H3562 Retinal hemorrhage, left eye: Secondary | ICD-10-CM | POA: Diagnosis not present

## 2017-10-22 DIAGNOSIS — Z961 Presence of intraocular lens: Secondary | ICD-10-CM | POA: Diagnosis not present

## 2017-10-25 ENCOUNTER — Other Ambulatory Visit (HOSPITAL_COMMUNITY): Payer: Self-pay

## 2017-10-25 NOTE — Progress Notes (Signed)
I spoke with pt on Wednesday, this week and she agreed to Friday morning [facility] visit @ 9am.  Pt stated that the pharmacy has prescriptions that were incorrect, therefore she wanted to make sure they were correct before she picked them up.  Pt has limited transportation.  There's no answer at the door although I can hear pt moving around.  I called pt and she stated that she was inside of her apartment taking a nap and for me to come to the door.  Pt still didn't come to the door after I knocked a couple more times.

## 2017-10-26 DIAGNOSIS — G4733 Obstructive sleep apnea (adult) (pediatric): Secondary | ICD-10-CM | POA: Diagnosis not present

## 2017-10-29 ENCOUNTER — Telehealth (HOSPITAL_COMMUNITY): Payer: Self-pay

## 2017-10-30 NOTE — Telephone Encounter (Signed)
I called Ms Bin after seeing a missed call from her last week. She advised that she was in [place] and would call me when she gets back to [place] next week.

## 2017-11-05 ENCOUNTER — Ambulatory Visit: Payer: Medicare HMO

## 2017-11-06 ENCOUNTER — Telehealth (HOSPITAL_COMMUNITY): Payer: Self-pay

## 2017-11-06 NOTE — Telephone Encounter (Signed)
I called Danielle [name] to schedule an appointment. She stated she was going to be coming back from [place] tomorrow and asked if I could come around 16:30 to fill her pillbox.

## 2017-11-07 ENCOUNTER — Other Ambulatory Visit (HOSPITAL_COMMUNITY): Payer: Self-pay

## 2017-11-07 NOTE — Progress Notes (Signed)
Paramedicine Encounter    Patient ID: Danielle [name], female    DOB: [birth year], 72 y.o.   MRN: [id number]   Patient Care Team: Seward Carol, MD as PCP - General (Internal Medicine)  Patient Active Problem List   Diagnosis Date Noted  . OSA (obstructive sleep apnea) 02/18/2017  . Acute combined systolic and diastolic congestive heart failure ([place]) 02/05/2017  . Essential hypertension   . Diabetes mellitus without complication ([place])   . Cancer ([place])   . Thyroid disease     Current Outpatient Medications:  .  acetaminophen (TYLENOL) 650 MG CR tablet, Take 650 mg by mouth every 8 (eight) hours as needed for pain., Disp: , Rfl:  .  aspirin 81 MG tablet, Take 1 tablet (81 mg total) by mouth daily., Disp: 30 tablet, Rfl: 3 .  atorvastatin (LIPITOR) 80 MG tablet, Take 1 tablet (80 mg total) by mouth at bedtime., Disp: 30 tablet, Rfl: 11 .  Biotin 10 MG CAPS, Take 10 mg by mouth daily., Disp: , Rfl:  .  carvedilol (COREG) 25 MG tablet, Take 1 tablet (25 mg total) by mouth 2 (two) times daily., Disp: 180 tablet, Rfl: 3 .  CHONDROITIN SULFATE PO, Take 250 mg by mouth daily., Disp: , Rfl:  .  ferrous sulfate 325 (65 FE) MG tablet, Take 325 mg by mouth 2 (two) times daily with a meal., Disp: , Rfl:  .  insulin glargine (LANTUS) 100 UNIT/ML injection, Inject 30 Units into the skin 2 (two) times daily. , Disp: , Rfl:  .  insulin lispro (HUMALOG) 100 UNIT/ML injection, Inject 5 Units into the skin daily with lunch. , Disp: , Rfl:  .  isosorbide mononitrate (IMDUR) 30 MG 24 hr tablet, Take 1 tablet (30 mg total) by mouth daily., Disp: 30 tablet, Rfl: 3 .  levothyroxine (SYNTHROID, LEVOTHROID) 112 MCG tablet, Take 112 mcg by mouth daily before breakfast., Disp: , Rfl:  .  losartan (COZAAR) 50 MG tablet, Take 1 tablet (50 mg total) by mouth daily., Disp: 30 tablet, Rfl: 3 .  metFORMIN (GLUCOPHAGE) 500 MG tablet, Take 500 mg by mouth 2 (two) times daily with a meal. , Disp: , Rfl:  .  Multiple  Vitamins-Minerals (ALIVE ONCE DAILY WOMENS 50+ PO), Take 1 tablet by mouth daily., Disp: , Rfl:  .  omeprazole (PRILOSEC) 40 MG capsule, Take 40 mg by mouth daily., Disp: , Rfl:  .  potassium chloride (K-DUR,KLOR-CON) 10 MEQ tablet, Take 2 tablets (20 mEq total) by mouth daily., Disp: 60 tablet, Rfl: 5 .  spironolactone (ALDACTONE) 25 MG tablet, Take 1 tablet (25 mg total) by mouth every other day., Disp: 15 tablet, Rfl: 5 .  ticagrelor (BRILINTA) 90 MG TABS tablet, Take 1 tablet (90 mg total) by mouth 2 (two) times daily., Disp: 60 tablet, Rfl: 5 .  torsemide (DEMADEX) 20 MG tablet, Take 40 mg (2 tabs) in am and 20 mg (1 tab) in pm., Disp: 90 tablet, Rfl: 11 .  Travoprost, BAK Free, (TRAVATAN) 0.004 % SOLN ophthalmic solution, Place 1 drop into both eyes daily., Disp: , Rfl:  .  calcium citrate (CALCITRATE - DOSED IN MG ELEMENTAL CALCIUM) 950 MG tablet, Take 1 tablet by mouth 2 (two) times daily. , Disp: , Rfl:  .  febuxostat (ULORIC) 40 MG tablet, Take 1 tablet (40 mg total) by mouth daily. (Patient not taking: Reported on 11/07/2017), Disp: 30 tablet, Rfl: 0 .  ibuprofen (ADVIL,MOTRIN) 800 MG tablet, Take 800 mg by mouth every 6 (  six) hours as needed for mild pain (gout/arthritis pain)., Disp: , Rfl:  .  Lactobacillus (ACIDOPHILUS PO), Take 1 capsule by mouth daily., Disp: , Rfl:  .  nitroGLYCERIN (NITROSTAT) 0.4 MG SL tablet, Place 1 tablet (0.4 mg total) under the tongue every 5 (five) minutes as needed for chest pain. (Patient not taking: Reported on 08/29/2017), Disp: 25 tablet, Rfl: 0 Allergies  Allergen Reactions  . Sulfa Antibiotics Rash      Social History   Socioeconomic History  . Marital status: Married    Spouse name: Not on file  . Number of children: Not on file  . Years of education: Not on file  . Highest education level: Not on file  Occupational History  . Not on file  Social Needs  . Financial resource strain: Not on file  . Food insecurity:    Worry: Not on file     Inability: Not on file  . Transportation needs:    Medical: Not on file    Non-medical: Not on file  Tobacco Use  . Smoking status: Former Smoker    Packs/day: 1.00    Years: 22.00    Pack years: 22.00    Types: Cigarettes    Last attempt to quit: 1983    Years since quitting: 36.4  . Smokeless tobacco: Never Used  Substance and Sexual Activity  . Alcohol use: Yes    Comment: ocassional  . Drug use: No  . Sexual activity: Never    Birth control/protection: None, Post-menopausal  Lifestyle  . Physical activity:    Days per week: Not on file    Minutes per session: Not on file  . Stress: Not on file  Relationships  . Social connections:    Talks on phone: Not on file    Gets together: Not on file    Attends religious service: Not on file    Active member of club or organization: Not on file    Attends meetings of clubs or organizations: Not on file    Relationship status: Not on file  . Intimate partner violence:    Fear of current or ex partner: Not on file    Emotionally abused: Not on file    Physically abused: Not on file    Forced sexual activity: Not on file  Other Topics Concern  . Not on file  Social History Narrative  . Not on file    Physical Exam  Constitutional: She is oriented to person, place, and time.  Cardiovascular: Normal rate.  Pulmonary/Chest: Effort normal and breath sounds normal.  Abdominal: Soft.  Musculoskeletal: Normal range of motion. She exhibits edema.  Neurological: She is alert and oriented to person, place, and time.  Skin: Skin is warm and dry.  Psychiatric: She has a normal mood and affect.        Future Appointments  Date Time Provider [place]  11/11/2017  9:00 AM Sueanne Margarita, MD [facility]  11/13/2017 12:50 PM [facility] MM 3 [facility]  11/15/2017 11:30 AM [facility] PA/NP [facility] None  11/19/2017  7:20 AM [facility] DEVICE REMOTES [facility]    BP 102/60 (BP Location:  Right Arm, Patient Position: Sitting, Cuff Size: Large)   Pulse 70   Resp 16   Wt 202 lb (91.6 kg)   SpO2 98%   BMI 34.67 kg/m   Weight yesterday- Did not weigh Last visit weight- 193 lb   Ms Frappier was seen at home today and reported feeling  well. She had just returned from a week long trip to [place] and had gained some weight but said that she had gone missed "two or three doses" of torsemide. She had some BLEE but denied SOB or orthopnea. She also denied headaches or dizziness. Her medications were verified she is no longer taking Uloric because she believes it made her break out in a rash. She spoke to her rheumatologist who gave her the OK to restart allopurinol 100 mg daily. Her pillbox was filled accordingly.   [name], EMT 11/07/17  ACTION: Home visit completed Next visit planned for 1 week

## 2017-11-08 ENCOUNTER — Other Ambulatory Visit (HOSPITAL_COMMUNITY): Payer: Self-pay

## 2017-11-08 MED ORDER — ALLOPURINOL 100 MG PO TABS: 100.0000 mg | ORAL_TABLET | Freq: Every day | ORAL | Status: DC

## 2017-11-11 ENCOUNTER — Encounter: Payer: Self-pay | Admitting: Cardiology

## 2017-11-11 ENCOUNTER — Encounter (INDEPENDENT_AMBULATORY_CARE_PROVIDER_SITE_OTHER): Payer: Self-pay

## 2017-11-11 ENCOUNTER — Ambulatory Visit (INDEPENDENT_AMBULATORY_CARE_PROVIDER_SITE_OTHER): Payer: Medicare HMO | Admitting: Cardiology

## 2017-11-11 VITALS — BP 118/64 | HR 73 | Ht 64.0 in | Wt 202.8 lb

## 2017-11-11 DIAGNOSIS — G4733 Obstructive sleep apnea (adult) (pediatric): Secondary | ICD-10-CM | POA: Diagnosis not present

## 2017-11-11 DIAGNOSIS — M7989 Other specified soft tissue disorders: Secondary | ICD-10-CM

## 2017-11-11 DIAGNOSIS — E669 Obesity, unspecified: Secondary | ICD-10-CM | POA: Diagnosis not present

## 2017-11-11 DIAGNOSIS — I10 Essential (primary) hypertension: Secondary | ICD-10-CM | POA: Diagnosis not present

## 2017-11-11 HISTORY — DX: Obesity, unspecified: E66.9

## 2017-11-11 NOTE — Progress Notes (Signed)
Cardiology Office Note:    Date:  11/11/2017   ID:  Danielle [name], DOB 08/27/1945, MRN [id number]  PCP:  Seward Carol, MD  Cardiologist:  No primary care provider on file.    Referring MD: Seward Carol, MD   Chief Complaint  Patient presents with  . Sleep Apnea  . Hypertension    History of Present Illness:    Danielle [name] is a 72 y.o. female with a hx of chronic combined systolic/diastolic CHF, diabetes mellitus, hypertension and obstructive sleep apnea.  She apparently had a history of sleep apnea in the past and was referred last fall by Advanced heart failure service for sleep study.  This was due to excessive daytime fatigue and snoring.  Sleep study showed severe obstructive sleep apnea with an AHI of 75.6/h and mild central sleep apnea with a CAI of 7.7/h.  Oxygen saturations dropped as low as 82%.  There was moderate snoring.  She he underwent CPAP titration to 9 cm H2O.  She is now here for follow-up. She is doing well with her CPAP device and thinks that she has gotten used to it.  She tolerates the mask and feels the pressure is adequate.  Since going on CPAP she feels rested in the am still occasionally take a nap during the day..  She has had some problems with mouth dryness from mouth breathing.  She uses a nasal pillow mask.  She is also complained of some dryness in her eyes.  She is also complaining of right lower extremity pain in her leg behind her knee and calf muscle.  Her leg is noticeably more swollen than the other leg.  Past Medical History:  Diagnosis Date  . Acute combined systolic and diastolic congestive heart failure ([place]) 02/05/2017  . Arthritis   . Blood dyscrasia    per pt-has small blood cells-appears as if anemic  . Cancer (HCC)    breast  . CHF (congestive heart failure) ([place])   . Complication of anesthesia    difficult to awaken from per pt  . Diabetes mellitus without complication ([place])   . Dyspnea   . Essential hypertension   . Heart  disease   . Hypothyroidism   . Obesity (BMI 30-39.9) 11/11/2017  . OSA (obstructive sleep apnea) 02/18/2017    severe obstructive sleep apnea with an AHI of 75.6/h and mild central sleep apnea with a CAI of 7.7/h.  Oxygen saturations dropped as low as 82%.   He is on CPAP at 9 cm H2O.  . Presence of permanent cardiac pacemaker   . Sleep apnea   . Thyroid disease    hypothyroid    Past Surgical History:  Procedure Laterality Date  . BREAST LUMPECTOMY WITH NEEDLE LOCALIZATION AND AXILLARY LYMPH NODE DISSECTION  1985  . BREAST SURGERY Left    partial mastectomy  . CORONARY STENT PLACEMENT  2010, 2012,2017    2 done 2010 and 1 replaced 2012 and 2 replaced in 2017  . EYE SURGERY Bilateral    bilateral lens implant  . INSERT / REPLACE / REMOVE PACEMAKER  07/19/2014  . REPLACEMENT TOTAL KNEE Left 09/2012  . THYROIDECTOMY, PARTIAL  1978  . TONSILLECTOMY      Current Medications: Current Meds  Medication Sig  . acetaminophen (TYLENOL) 650 MG CR tablet Take 650 mg by mouth every 8 (eight) hours as needed for pain.  Marland Kitchen allopurinol (ZYLOPRIM) 100 MG tablet Take 1 tablet (100 mg total) by mouth daily.  Marland Kitchen aspirin 81  MG tablet Take 1 tablet (81 mg total) by mouth daily.  Marland Kitchen atorvastatin (LIPITOR) 80 MG tablet Take 1 tablet (80 mg total) by mouth at bedtime.  . Biotin 10 MG CAPS Take 10 mg by mouth daily.  . calcium citrate (CALCITRATE - DOSED IN MG ELEMENTAL CALCIUM) 950 MG tablet Take 1 tablet by mouth 2 (two) times daily.   . carvedilol (COREG) 25 MG tablet Take 1 tablet (25 mg total) by mouth 2 (two) times daily.  . CHONDROITIN SULFATE PO Take 250 mg by mouth daily.  . ferrous sulfate 325 (65 FE) MG tablet Take 325 mg by mouth 2 (two) times daily with a meal.  . ibuprofen (ADVIL,MOTRIN) 800 MG tablet Take 800 mg by mouth every 6 (six) hours as needed for mild pain (gout/arthritis pain).  . insulin glargine (LANTUS) 100 UNIT/ML injection Inject 30 Units into the skin 2 (two) times daily.   .  insulin lispro (HUMALOG) 100 UNIT/ML injection Inject 5 Units into the skin daily with lunch.   . isosorbide mononitrate (IMDUR) 30 MG 24 hr tablet Take 1 tablet (30 mg total) by mouth daily.  . Lactobacillus (ACIDOPHILUS PO) Take 1 capsule by mouth daily.  Marland Kitchen levothyroxine (SYNTHROID, LEVOTHROID) 75 MCG tablet Take 75 mcg by mouth daily before breakfast.  . losartan (COZAAR) 50 MG tablet Take 1 tablet (50 mg total) by mouth daily.  . metFORMIN (GLUCOPHAGE) 500 MG tablet Take 500 mg by mouth 2 (two) times daily with a meal.   . Multiple Vitamins-Minerals (ALIVE ONCE DAILY WOMENS 50+ PO) Take 1 tablet by mouth daily.  . nitroGLYCERIN (NITROSTAT) 0.4 MG SL tablet Place 1 tablet (0.4 mg total) under the tongue every 5 (five) minutes as needed for chest pain.  Marland Kitchen omeprazole (PRILOSEC) 40 MG capsule Take 40 mg by mouth daily.  . potassium chloride (K-DUR,KLOR-CON) 10 MEQ tablet Take 2 tablets (20 mEq total) by mouth daily.  Marland Kitchen spironolactone (ALDACTONE) 25 MG tablet Take 1 tablet (25 mg total) by mouth every other day.  . ticagrelor (BRILINTA) 90 MG TABS tablet Take 1 tablet (90 mg total) by mouth 2 (two) times daily.  Marland Kitchen torsemide (DEMADEX) 20 MG tablet Take 40 mg (2 tabs) in am and 20 mg (1 tab) in pm.  . Travoprost, BAK Free, (TRAVATAN) 0.004 % SOLN ophthalmic solution Place 1 drop into both eyes daily.     Allergies:   Sulfa antibiotics   Social History   Socioeconomic History  . Marital status: Married    Spouse name: Not on file  . Number of children: Not on file  . Years of education: Not on file  . Highest education level: Not on file  Occupational History  . Not on file  Social Needs  . Financial resource strain: Not on file  . Food insecurity:    Worry: Not on file    Inability: Not on file  . Transportation needs:    Medical: Not on file    Non-medical: Not on file  Tobacco Use  . Smoking status: Former Smoker    Packs/day: 1.00    Years: 22.00    Pack years: 22.00     Types: Cigarettes    Last attempt to quit: 1983    Years since quitting: 36.4  . Smokeless tobacco: Never Used  Substance and Sexual Activity  . Alcohol use: Yes    Comment: ocassional  . Drug use: No  . Sexual activity: Never    Birth control/protection: None, Post-menopausal  Lifestyle  . Physical activity:    Days per week: Not on file    Minutes per session: Not on file  . Stress: Not on file  Relationships  . Social connections:    Talks on phone: Not on file    Gets together: Not on file    Attends religious service: Not on file    Active member of club or organization: Not on file    Attends meetings of clubs or organizations: Not on file    Relationship status: Not on file  Other Topics Concern  . Not on file  Social History Narrative  . Not on file     Family History: The patient's family history includes Diabetes in her sister; Heart disease in her brother and father; Multiple myeloma in her mother; Other in her sister and sister.  ROS:   Please see the history of present illness.    ROS  All other systems reviewed and negative.   EKGs/Labs/Other Studies Reviewed:    The following studies were reviewed today: PAP download  EKG:  EKG is not ordered today.    Recent Labs: 02/03/2017: ALT 35 02/05/2017: Hemoglobin 9.1; Platelets 190 09/20/2017: B Natriuretic Peptide 117.8; BUN 19; Creatinine, Ser 1.13; Potassium 4.5; Sodium 137   Recent Lipid Panel    Component Value Date/Time   CHOL 87 03/06/2017 1128   TRIG 123 03/06/2017 1128   HDL 38 (L) 03/06/2017 1128   CHOLHDL 2.3 03/06/2017 1128   VLDL 25 03/06/2017 1128   LDLCALC 24 03/06/2017 1128    Physical Exam:    VS:  BP 118/64   Pulse 73   Ht 5' 4"  (1.626 m)   Wt 202 lb 12.8 oz (92 kg)   SpO2 97%   BMI 34.81 kg/m     Wt Readings from Last 3 Encounters:  11/11/17 202 lb 12.8 oz (92 kg)  11/07/17 202 lb (91.6 kg)  10/18/17 193 lb (87.5 kg)     GEN:  Well nourished, well developed in no acute  distress HEENT: Normal NECK: No JVD; No carotid bruits LYMPHATICS: No lymphadenopathy CARDIAC: RRR, no murmurs, rubs, gallops RESPIRATORY:  Clear to auscultation without rales, wheezing or rhonchi  ABDOMEN: Soft, non-tender, non-distended MUSCULOSKELETAL:  No edema; No deformity  SKIN: Warm and dry NEUROLOGIC:  Alert and oriented x 3 PSYCHIATRIC:  Normal affect   ASSESSMENT:    1. OSA (obstructive sleep apnea)   2. Essential hypertension   3. Obesity (BMI 30-39.9)   4. Right leg swelling    PLAN:    In order of problems listed above:  1.  OSA - the patient is tolerating PAP therapy well without any problems. The PAP download was reviewed today and showed an AHI of 5.9/hr on 9 cm H2O with 80% compliance in using more than 4 hours nightly.  The patient has been using and benefiting from PAP use and will continue to benefit from therapy.  I have encouraged her to adjust her humidity to see if that helps with her dry eyes as well as add a chinstrap which should help with her mouth dryness.  2.  Hypertension -BP is well controlled on exam today.  He will continue on carvedilol 25 mg twice daily, and 25 mg daily, Imdur 30 mg daily and losartan 50 mg daily.  Creatinine was stable at 1.62157 2019.  3.  Obesity -her exercise is limited as she walks with a cane..   4.  Right lower extremity swelling -her  legs noticeably more swollen than the left leg.  It is also painful to touch in the back of her leg.  I have recommended right lower extremity venous Doppler to rule out DVT.  She also has an appoint with her PCP tomorrow     Medication Adjustments/Labs and Tests Ordered: Current medicines are reviewed at length with the patient today.  Concerns regarding medicines are outlined above.  Orders Placed This Encounter  Procedures  . DME Other see comment   No orders of the defined types were placed in this encounter.   Signed, Fransico Him, MD  11/11/2017 8:57 AM    [place]

## 2017-11-11 NOTE — Patient Instructions (Signed)
Medication Instructions:  Your physician recommends that you continue on your current medications as directed. Please refer to the Current Medication list given to you today.  If you need a refill on your cardiac medications, please contact your pharmacy first.  Labwork: None ordered   Testing/Procedures: Your physician has requested that you have a lower extremity venous duplex. This test is an ultrasound of the veins in the legs. It looks at venous blood flow that carries blood from the heart to the legs. Allow one hour for a Lower Venous exam. Allow thirty minutes for an Upper Venous exam. There are no restrictions or special instructions.   Follow-Up: Your physician wants you to follow-up in: 1 year with Dr. Radford Pax. You will receive a reminder letter in the mail two months in advance. If you don't receive a letter, please call our office to schedule the follow-up appointment.  Any Other Special Instructions Will Be Listed Below (If Applicable).   Thank you for choosing [facility], RN  [phone]  If you need a refill on your cardiac medications before your next appointment, please call your pharmacy.

## 2017-11-13 ENCOUNTER — Ambulatory Visit
Admission: RE | Admit: 2017-11-13 | Discharge: 2017-11-13 | Disposition: A | Discharge status: Home / Self Care | Payer: Medicare HMO | Source: Ambulatory Visit | Attending: Obstetrics and Gynecology | Admitting: Obstetrics and Gynecology

## 2017-11-13 DIAGNOSIS — Z1231 Encounter for screening mammogram for malignant neoplasm of breast: Secondary | ICD-10-CM | POA: Diagnosis not present

## 2017-11-13 HISTORY — DX: Personal history of irradiation: Z92.3

## 2017-11-13 HISTORY — DX: Personal history of antineoplastic chemotherapy: Z92.21

## 2017-11-14 ENCOUNTER — Other Ambulatory Visit (HOSPITAL_COMMUNITY): Payer: Self-pay

## 2017-11-14 NOTE — Progress Notes (Signed)
Advanced Heart Failure Clinic Note    Primary Care: Dr. Delfina Redwood Primary Cardiologist: Dr. Haroldine Laws  EP: Dr Tomasa Blase  HPI:  Danielle [name] is a 72 year old female with a past medical history of systolic CHF (EF 25-00%), CAD, ischemic cardiomyopathy, HTN, DM, hypothyroidism, and OSA sleep study 03/2017.   Admitted 02/03/17-02/06/17 with SOB, acute on chronic systolic CHF. Diuresed with IV lasix (12 pounds), discharge weight was 215 pounds.   Today she returns for HF follow up. Complaining of RLE pain. On June 3rd she was on a plane and on June 11 she was in a car for several hours. A few days later she developed RLE pain. She saw Dr Radford Pax on June 17 and was set up for [place] for possible DVT. SOB with exertion. Denies PND/Orthopnea. Appetite ok. No fever or chills. Weight at home 198-200 pounds. Taking all medications.  ECHO 01/2017- EF 45-50% Grade IIDD Left Atrium Severely dilated.   Labs from [facility] 08/01/2017 Hgb 8.9 Cholesterol 89 HDL 38 TG 91 K 4.8 Creatinine 1.0   Review of systems complete and found to be negative unless listed in HPI.    Past Medical History:  Diagnosis Date  . Acute combined systolic and diastolic congestive heart failure ([place]) 02/05/2017  . Arthritis   . Blood dyscrasia    per pt-has small blood cells-appears as if anemic  . Breast cancer ([place])   . Cancer (HCC)    breast  . CHF (congestive heart failure) ([place])   . Complication of anesthesia    difficult to awaken from per pt  . Diabetes mellitus without complication ([place])   . Dyspnea   . Essential hypertension   . Heart disease   . Hypothyroidism   . Obesity (BMI 30-39.9) 11/11/2017  . OSA (obstructive sleep apnea) 02/18/2017    severe obstructive sleep apnea with an AHI of 75.6/h and mild central sleep apnea with a CAI of 7.7/h.  Oxygen saturations dropped as low as 82%.   He is on CPAP at 9 cm H2O.  . Personal history of chemotherapy   . Personal history of radiation therapy   . Presence of permanent cardiac  pacemaker   . Sleep apnea   . Thyroid disease    hypothyroid    Current Outpatient Medications  Medication Sig Dispense Refill  . acetaminophen (TYLENOL) 650 MG CR tablet Take 650 mg by mouth every 8 (eight) hours as needed for pain.    Marland Kitchen allopurinol (ZYLOPRIM) 100 MG tablet Take 1 tablet (100 mg total) by mouth daily.    Marland Kitchen aspirin 81 MG tablet Take 1 tablet (81 mg total) by mouth daily. 30 tablet 3  . atorvastatin (LIPITOR) 80 MG tablet Take 1 tablet (80 mg total) by mouth at bedtime. 30 tablet 11  . Biotin 10 MG CAPS Take 10 mg by mouth daily.    . calcium citrate (CALCITRATE - DOSED IN MG ELEMENTAL CALCIUM) 950 MG tablet Take 1 tablet by mouth 2 (two) times daily.     . carvedilol (COREG) 25 MG tablet Take 1 tablet (25 mg total) by mouth 2 (two) times daily. 180 tablet 3  . CHONDROITIN SULFATE PO Take 250 mg by mouth daily.    . ferrous sulfate 325 (65 FE) MG tablet Take 325 mg by mouth 2 (two) times daily with a meal.    . ibuprofen (ADVIL,MOTRIN) 800 MG tablet Take 800 mg by mouth every 6 (six) hours as needed for mild pain (gout/arthritis pain).    Marland Kitchen  insulin glargine (LANTUS) 100 UNIT/ML injection Inject 30 Units into the skin 2 (two) times daily.     . insulin lispro (HUMALOG) 100 UNIT/ML injection Inject 5 Units into the skin daily with lunch.     . isosorbide mononitrate (IMDUR) 30 MG 24 hr tablet Take 1 tablet (30 mg total) by mouth daily. 30 tablet 3  . Lactobacillus (ACIDOPHILUS PO) Take 1 capsule by mouth daily.    Marland Kitchen levothyroxine (SYNTHROID, LEVOTHROID) 75 MCG tablet Take 75 mcg by mouth daily before breakfast.    . losartan (COZAAR) 50 MG tablet Take 1 tablet (50 mg total) by mouth daily. 30 tablet 3  . metFORMIN (GLUCOPHAGE) 500 MG tablet Take 500 mg by mouth 2 (two) times daily with a meal.     . Multiple Vitamins-Minerals (ALIVE ONCE DAILY WOMENS 50+ PO) Take 1 tablet by mouth daily.    . nitroGLYCERIN (NITROSTAT) 0.4 MG SL tablet Place 1 tablet (0.4 mg total) under the  tongue every 5 (five) minutes as needed for chest pain. 25 tablet 0  . omeprazole (PRILOSEC) 40 MG capsule Take 40 mg by mouth daily.    . potassium chloride (K-DUR,KLOR-CON) 10 MEQ tablet Take 2 tablets (20 mEq total) by mouth daily. 60 tablet 5  . spironolactone (ALDACTONE) 25 MG tablet Take 1 tablet (25 mg total) by mouth every other day. 15 tablet 5  . ticagrelor (BRILINTA) 90 MG TABS tablet Take 1 tablet (90 mg total) by mouth 2 (two) times daily. 60 tablet 5  . torsemide (DEMADEX) 20 MG tablet Take 40 mg (2 tabs) in am and 20 mg (1 tab) in pm. 90 tablet 11  . Travoprost, BAK Free, (TRAVATAN) 0.004 % SOLN ophthalmic solution Place 1 drop into both eyes daily.     No current facility-administered medications for this encounter.    Allergies  Allergen Reactions  . Sulfa Antibiotics Rash  . Uloric [Febuxostat] Rash   Social History   Socioeconomic History  . Marital status: Married    Spouse name: Not on file  . Number of children: Not on file  . Years of education: Not on file  . Highest education level: Not on file  Occupational History  . Not on file  Social Needs  . Financial resource strain: Not on file  . Food insecurity:    Worry: Not on file    Inability: Not on file  . Transportation needs:    Medical: Not on file    Non-medical: Not on file  Tobacco Use  . Smoking status: Former Smoker    Packs/day: 1.00    Years: 22.00    Pack years: 22.00    Types: Cigarettes    Last attempt to quit: 1983    Years since quitting: 36.4  . Smokeless tobacco: Never Used  Substance and Sexual Activity  . Alcohol use: Yes    Comment: ocassional  . Drug use: No  . Sexual activity: Never    Birth control/protection: None, Post-menopausal  Lifestyle  . Physical activity:    Days per week: Not on file    Minutes per session: Not on file  . Stress: Not on file  Relationships  . Social connections:    Talks on phone: Not on file    Gets together: Not on file    Attends  religious service: Not on file    Active member of club or organization: Not on file    Attends meetings of clubs or organizations: Not on file  Relationship status: Not on file  . Intimate partner violence:    Fear of current or ex partner: Not on file    Emotionally abused: Not on file    Physically abused: Not on file    Forced sexual activity: Not on file  Other Topics Concern  . Not on file  Social History Narrative  . Not on file   Family History  Problem Relation Age of Onset  . Multiple myeloma Mother   . Heart disease Father   . Other Sister   . Heart disease Brother   . Diabetes Sister   . Other Sister    Vitals:   11/15/17 1046  BP: 132/70  Pulse: 80  SpO2: 97%  Weight: 202 lb (91.6 kg)   Wt Readings from Last 3 Encounters:  11/15/17 202 lb (91.6 kg)  11/14/17 198 lb 3.2 oz (89.9 kg)  11/11/17 202 lb 12.8 oz (92 kg)      PHYSICAL EXAM: General:  Well appearing. No resp difficulty. Walked in the clinic with a cane. HEENT: normal Neck: supple. JVP 9-10 . Carotids 2+ bilat; no bruits. No lymphadenopathy or thryomegaly appreciated. Cor: PMI nondisplaced. Regular rate & rhythm. No rubs, gallops or murmurs. Lungs: clear Abdomen: soft, nontender, nondistended. No hepatosplenomegaly. No bruits or masses. Good bowel sounds. Extremities: no cyanosis, clubbing, rash, RLE 2-3+ edema LLE 1-2+ edema.  Neuro: alert & orientedx3, cranial nerves grossly intact. moves all 4 extremities w/o difficulty. Affect pleasant  EKG: NSR 76 bpm    ASSESSMENT & PLAN:  1. Chronic systolic CHF: ICM, Echo 0/93/81 EF 45-50%, LA severely dilated, restrictive CM pattern.  - NYHA II-III. Volume overloaded today. . Increase torsemide to 40 mg/40 mg for now.  - Continue imdur 30 mg daily - Continue losartan  50 mg daily. - Continue 25 mg spironolactone daily.  - Continue Coreg 25 mg twice a day.     2. CAD: Stenting in 2010, 2012 and 2016. Records pending from [place]. No s/s ischemia    - Continue ASA and statin.   3. HTN - Stable.     4. DM - Follows with Dr. Delfina Redwood. Sees in several weeks.   5. PPM - Dr. Curt Bears now following.   6. OSA -Had Sleep study. CPAP start pending.   7. Gout - Treated per PCP.  - Has follow up with Rheumatology   8. Arthritis - Continue tylenol. Further per PCP.   9. OSA-  Continue CPAP - Follows with Dr Radford Pax  10. RLE Pain Send for lower doppler to rule out DVT ASAP. Complaining of RLE pain.  She was on plane 6/3 and long car ride on 11/05/2017.    Follow up up 4-5 weeks.   Darrick Grinder, NP 11/15/17

## 2017-11-14 NOTE — Progress Notes (Signed)
Paramedicine Encounter    Patient ID: Danielle [name], female    DOB: 10/16/1945, 72 y.o.   MRN: [id number]   Patient Care Team: Seward Carol, MD as PCP - General (Internal Medicine)  Patient Active Problem List   Diagnosis Date Noted  . Obesity (BMI 30-39.9) 11/11/2017  . OSA (obstructive sleep apnea) 02/18/2017  . Acute combined systolic and diastolic congestive heart failure ([place]) 02/05/2017  . Essential hypertension   . Diabetes mellitus without complication ([place])   . Cancer ([place])   . Thyroid disease     Current Outpatient Medications:  .  acetaminophen (TYLENOL) 650 MG CR tablet, Take 650 mg by mouth every 8 (eight) hours as needed for pain., Disp: , Rfl:  .  allopurinol (ZYLOPRIM) 100 MG tablet, Take 1 tablet (100 mg total) by mouth daily., Disp: , Rfl:  .  aspirin 81 MG tablet, Take 1 tablet (81 mg total) by mouth daily., Disp: 30 tablet, Rfl: 3 .  atorvastatin (LIPITOR) 80 MG tablet, Take 1 tablet (80 mg total) by mouth at bedtime., Disp: 30 tablet, Rfl: 11 .  Biotin 10 MG CAPS, Take 10 mg by mouth daily., Disp: , Rfl:  .  carvedilol (COREG) 25 MG tablet, Take 1 tablet (25 mg total) by mouth 2 (two) times daily., Disp: 180 tablet, Rfl: 3 .  CHONDROITIN SULFATE PO, Take 250 mg by mouth daily., Disp: , Rfl:  .  ferrous sulfate 325 (65 FE) MG tablet, Take 325 mg by mouth 2 (two) times daily with a meal., Disp: , Rfl:  .  insulin glargine (LANTUS) 100 UNIT/ML injection, Inject 30 Units into the skin 2 (two) times daily. , Disp: , Rfl:  .  insulin lispro (HUMALOG) 100 UNIT/ML injection, Inject 5 Units into the skin daily with lunch. , Disp: , Rfl:  .  isosorbide mononitrate (IMDUR) 30 MG 24 hr tablet, Take 1 tablet (30 mg total) by mouth daily., Disp: 30 tablet, Rfl: 3 .  levothyroxine (SYNTHROID, LEVOTHROID) 75 MCG tablet, Take 75 mcg by mouth daily before breakfast., Disp: , Rfl:  .  losartan (COZAAR) 50 MG tablet, Take 1 tablet (50 mg total) by mouth daily., Disp: 30 tablet, Rfl:  3 .  metFORMIN (GLUCOPHAGE) 500 MG tablet, Take 500 mg by mouth 2 (two) times daily with a meal. , Disp: , Rfl:  .  Multiple Vitamins-Minerals (ALIVE ONCE DAILY WOMENS 50+ PO), Take 1 tablet by mouth daily., Disp: , Rfl:  .  omeprazole (PRILOSEC) 40 MG capsule, Take 40 mg by mouth daily., Disp: , Rfl:  .  potassium chloride (K-DUR,KLOR-CON) 10 MEQ tablet, Take 2 tablets (20 mEq total) by mouth daily., Disp: 60 tablet, Rfl: 5 .  spironolactone (ALDACTONE) 25 MG tablet, Take 1 tablet (25 mg total) by mouth every other day., Disp: 15 tablet, Rfl: 5 .  ticagrelor (BRILINTA) 90 MG TABS tablet, Take 1 tablet (90 mg total) by mouth 2 (two) times daily., Disp: 60 tablet, Rfl: 5 .  torsemide (DEMADEX) 20 MG tablet, Take 40 mg (2 tabs) in am and 20 mg (1 tab) in pm., Disp: 90 tablet, Rfl: 11 .  Travoprost, BAK Free, (TRAVATAN) 0.004 % SOLN ophthalmic solution, Place 1 drop into both eyes daily., Disp: , Rfl:  .  calcium citrate (CALCITRATE - DOSED IN MG ELEMENTAL CALCIUM) 950 MG tablet, Take 1 tablet by mouth 2 (two) times daily. , Disp: , Rfl:  .  ibuprofen (ADVIL,MOTRIN) 800 MG tablet, Take 800 mg by mouth every 6 (six)  hours as needed for mild pain (gout/arthritis pain)., Disp: , Rfl:  .  Lactobacillus (ACIDOPHILUS PO), Take 1 capsule by mouth daily., Disp: , Rfl:  .  nitroGLYCERIN (NITROSTAT) 0.4 MG SL tablet, Place 1 tablet (0.4 mg total) under the tongue every 5 (five) minutes as needed for chest pain. (Patient not taking: Reported on 11/14/2017), Disp: 25 tablet, Rfl: 0 Allergies  Allergen Reactions  . Sulfa Antibiotics Rash      Social History   Socioeconomic History  . Marital status: Married    Spouse name: Not on file  . Number of children: Not on file  . Years of education: Not on file  . Highest education level: Not on file  Occupational History  . Not on file  Social Needs  . Financial resource strain: Not on file  . Food insecurity:    Worry: Not on file    Inability: Not on  file  . Transportation needs:    Medical: Not on file    Non-medical: Not on file  Tobacco Use  . Smoking status: Former Smoker    Packs/day: 1.00    Years: 22.00    Pack years: 22.00    Types: Cigarettes    Last attempt to quit: 1983    Years since quitting: 36.4  . Smokeless tobacco: Never Used  Substance and Sexual Activity  . Alcohol use: Yes    Comment: ocassional  . Drug use: No  . Sexual activity: Never    Birth control/protection: None, Post-menopausal  Lifestyle  . Physical activity:    Days per week: Not on file    Minutes per session: Not on file  . Stress: Not on file  Relationships  . Social connections:    Talks on phone: Not on file    Gets together: Not on file    Attends religious service: Not on file    Active member of club or organization: Not on file    Attends meetings of clubs or organizations: Not on file    Relationship status: Not on file  . Intimate partner violence:    Fear of current or ex partner: Not on file    Emotionally abused: Not on file    Physically abused: Not on file    Forced sexual activity: Not on file  Other Topics Concern  . Not on file  Social History Narrative  . Not on file    Physical Exam  Constitutional: She is oriented to person, place, and time.  Cardiovascular: Normal rate and regular rhythm.  Pulmonary/Chest: Effort normal and breath sounds normal.  Abdominal: Soft.  Musculoskeletal: Normal range of motion. She exhibits edema.  Neurological: She is alert and oriented to person, place, and time.  Skin: Skin is warm and dry.  Psychiatric: She has a normal mood and affect.        Future Appointments  Date Time Provider [place]  11/15/2017 11:30 AM [facility] PA/NP [facility] None  11/18/2017 11:00 AM [facility] VASC 1 [facility]  11/19/2017  7:20 AM [facility] DEVICE REMOTES [facility]    BP 128/70 (BP Location: Left Arm, Patient Position: Sitting, Cuff Size: Large)   Pulse 78    Resp 16   Wt 198 lb 3.2 oz (89.9 kg)   SpO2 96%   BMI 34.02 kg/m   Weight yesterday- 200 lb Last visit weight- 202 lb  Ms Stebbins was seen at home today and reported feeling "better than yesterday." She denied SOB, headache, dizziness or  orthopnea. She reported that she dropped her pillbox last week shortly after I left her house and did her best to refill it, though some pills weren't taken because she was not sure where they went. I told her that if this happens again during the hours I am working, she should call me to come back and fix it. She was agreeable to this. Her medications were verified and her pillbox was refilled.   [name], EMT 11/14/17  ACTION: Home visit completed Next visit planned for 1 week

## 2017-11-15 ENCOUNTER — Telehealth: Payer: Self-pay | Admitting: *Deleted

## 2017-11-15 ENCOUNTER — Ambulatory Visit (HOSPITAL_COMMUNITY)
Admission: RE | Admit: 2017-11-15 | Discharge: 2017-11-15 | Disposition: A | Discharge status: Home / Self Care | Payer: Medicare HMO | Source: Ambulatory Visit | Attending: Cardiology | Admitting: Cardiology

## 2017-11-15 ENCOUNTER — Ambulatory Visit (HOSPITAL_BASED_OUTPATIENT_CLINIC_OR_DEPARTMENT_OTHER)
Admission: RE | Admit: 2017-11-15 | Discharge: 2017-11-15 | Disposition: A | Discharge status: Home / Self Care | Payer: Medicare HMO | Source: Ambulatory Visit | Attending: Cardiology | Admitting: Cardiology

## 2017-11-15 VITALS — BP 132/70 | HR 80 | Wt 202.0 lb

## 2017-11-15 DIAGNOSIS — G4733 Obstructive sleep apnea (adult) (pediatric): Secondary | ICD-10-CM | POA: Insufficient documentation

## 2017-11-15 DIAGNOSIS — Z853 Personal history of malignant neoplasm of breast: Secondary | ICD-10-CM | POA: Diagnosis not present

## 2017-11-15 DIAGNOSIS — M79606 Pain in leg, unspecified: Secondary | ICD-10-CM

## 2017-11-15 DIAGNOSIS — E119 Type 2 diabetes mellitus without complications: Secondary | ICD-10-CM | POA: Insufficient documentation

## 2017-11-15 DIAGNOSIS — I110 Hypertensive heart disease with heart failure: Secondary | ICD-10-CM | POA: Diagnosis not present

## 2017-11-15 DIAGNOSIS — I5042 Chronic combined systolic (congestive) and diastolic (congestive) heart failure: Secondary | ICD-10-CM | POA: Diagnosis not present

## 2017-11-15 DIAGNOSIS — Z7982 Long term (current) use of aspirin: Secondary | ICD-10-CM | POA: Diagnosis not present

## 2017-11-15 DIAGNOSIS — M7989 Other specified soft tissue disorders: Secondary | ICD-10-CM

## 2017-11-15 DIAGNOSIS — I255 Ischemic cardiomyopathy: Secondary | ICD-10-CM | POA: Diagnosis not present

## 2017-11-15 DIAGNOSIS — Z79890 Hormone replacement therapy (postmenopausal): Secondary | ICD-10-CM | POA: Diagnosis not present

## 2017-11-15 DIAGNOSIS — M109 Gout, unspecified: Secondary | ICD-10-CM | POA: Diagnosis not present

## 2017-11-15 DIAGNOSIS — Z6839 Body mass index (BMI) 39.0-39.9, adult: Secondary | ICD-10-CM | POA: Diagnosis not present

## 2017-11-15 DIAGNOSIS — E039 Hypothyroidism, unspecified: Secondary | ICD-10-CM | POA: Diagnosis not present

## 2017-11-15 DIAGNOSIS — M1990 Unspecified osteoarthritis, unspecified site: Secondary | ICD-10-CM | POA: Diagnosis not present

## 2017-11-15 DIAGNOSIS — Z950 Presence of cardiac pacemaker: Secondary | ICD-10-CM | POA: Diagnosis not present

## 2017-11-15 DIAGNOSIS — Z794 Long term (current) use of insulin: Secondary | ICD-10-CM | POA: Diagnosis not present

## 2017-11-15 DIAGNOSIS — E669 Obesity, unspecified: Secondary | ICD-10-CM | POA: Diagnosis not present

## 2017-11-15 DIAGNOSIS — Z79899 Other long term (current) drug therapy: Secondary | ICD-10-CM | POA: Diagnosis not present

## 2017-11-15 DIAGNOSIS — I2510 Atherosclerotic heart disease of native coronary artery without angina pectoris: Secondary | ICD-10-CM | POA: Insufficient documentation

## 2017-11-15 DIAGNOSIS — Z87891 Personal history of nicotine dependence: Secondary | ICD-10-CM | POA: Diagnosis not present

## 2017-11-15 DIAGNOSIS — M79604 Pain in right leg: Secondary | ICD-10-CM | POA: Insufficient documentation

## 2017-11-15 LAB — BASIC METABOLIC PANEL
Anion gap: 10 (ref 5–15)
BUN: 30 mg/dL — AB (ref 6–20)
CO2: 28 mmol/L (ref 22–32)
CREATININE: 1.5 mg/dL — AB (ref 0.44–1.00)
Calcium: 9.1 mg/dL (ref 8.9–10.3)
Chloride: 102 mmol/L (ref 101–111)
GFR, EST AFRICAN AMERICAN: 39 mL/min — AB (ref >60)
GFR, EST NON AFRICAN AMERICAN: 34 mL/min — AB (ref >60)
Glucose, Bld: 172 mg/dL — ABNORMAL HIGH (ref 65–99)
POTASSIUM: 4.1 mmol/L (ref 3.5–5.1)
SODIUM: 140 mmol/L (ref 135–145)

## 2017-11-15 LAB — BRAIN NATRIURETIC PEPTIDE: B NATRIURETIC PEPTIDE 5: 71.3 pg/mL (ref 0.0–100.0)

## 2017-11-15 MED ORDER — TORSEMIDE 20 MG PO TABS: 40.0000 mg | ORAL_TABLET | Freq: Two times a day (BID) | ORAL | 11 refills | Status: DC

## 2017-11-15 NOTE — Telephone Encounter (Signed)
Took the call from [place] re: Dr. Radford Pax pt DVT scan.  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 Will send a message to Dr. Radford Pax and her RN, Mearl Latin.

## 2017-11-15 NOTE — Progress Notes (Signed)
Right lower extremity venous duplex has been completed. Negative for DVT. A complex structure of mixed echogenicity is noted in the popliteal fossa that extends to the mid, medial calf. Results were given to [facility] at Dr. Theodosia Blender office  11/15/17 12:28 PM Danielle [name] RVT

## 2017-11-15 NOTE — Patient Instructions (Signed)
CHANGE Torsemide to 40 mg (2 tabs) twice daily.  Routine lab work today. Will notify you of abnormal results, otherwise no news is good news!  Lower extremity dopplers today.  Return next week for repeat labs.  _______________________________________________________________ [name] Code: 1300  Follow up 4 weeks.  _______________________________________________________________ [name] Code: 3112  Take all medication as prescribed the day of your appointment. Bring all medications with you to your appointment.  Do the following things EVERYDAY: 1) Weigh yourself in the morning before breakfast. Write it down and keep it in a log. 2) Take your medicines as prescribed 3) Eat low salt foods-Limit salt (sodium) to 2000 mg per day.  4) Stay as active as you can everyday 5) Limit all fluids for the day to less than 2 liters

## 2017-11-17 NOTE — Telephone Encounter (Signed)
Please forward this to patient's PCP

## 2017-11-18 ENCOUNTER — Ambulatory Visit (HOSPITAL_COMMUNITY)
Admission: RE | Admit: 2017-11-18 | Payer: Medicare HMO | Source: Ambulatory Visit | Attending: Cardiology | Admitting: Cardiology

## 2017-11-18 NOTE — Telephone Encounter (Signed)
Notes recorded by Teressa Senter, RN on 11/18/2017 at 8:48 AM EDT Pt made aware of lower venous study results. informed her that results have been forwarded to Dr. Delfina Redwood, primary MD to follow up. I advised pt to call primary office to schedule appt. She stated understanding and thankful for the call.  Notes recorded by Sueanne Margarita, MD on 11/17/2017 at 10:16 PM EDT Cystic structure in popliteal fossa - could be ruptured cyst. Please forward this to PCP and have patient followup with primary

## 2017-11-19 ENCOUNTER — Ambulatory Visit (INDEPENDENT_AMBULATORY_CARE_PROVIDER_SITE_OTHER): Payer: Medicare HMO | Admitting: *Deleted

## 2017-11-19 DIAGNOSIS — I255 Ischemic cardiomyopathy: Secondary | ICD-10-CM

## 2017-11-19 DIAGNOSIS — M1711 Unilateral primary osteoarthritis, right knee: Secondary | ICD-10-CM | POA: Diagnosis not present

## 2017-11-19 DIAGNOSIS — M109 Gout, unspecified: Secondary | ICD-10-CM | POA: Diagnosis not present

## 2017-11-19 DIAGNOSIS — M7121 Synovial cyst of popliteal space [Baker], right knee: Secondary | ICD-10-CM | POA: Diagnosis not present

## 2017-11-19 NOTE — Progress Notes (Signed)
Remote pacemaker transmission.   

## 2017-11-20 ENCOUNTER — Encounter: Payer: Self-pay | Admitting: Cardiology

## 2017-11-20 ENCOUNTER — Telehealth (HOSPITAL_COMMUNITY): Payer: Self-pay

## 2017-11-20 NOTE — Telephone Encounter (Signed)
I called Danielle [name] to schedule an appointment. She stated she would be available tomorrow at 15:00 so we agreed to meet then.

## 2017-11-21 ENCOUNTER — Other Ambulatory Visit (HOSPITAL_COMMUNITY): Payer: Self-pay

## 2017-11-21 DIAGNOSIS — M1711 Unilateral primary osteoarthritis, right knee: Secondary | ICD-10-CM | POA: Diagnosis not present

## 2017-11-21 LAB — CUP PACEART REMOTE DEVICE CHECK
Battery Remaining Longevity: 92 mo
Brady Statistic AP VS Percent: 5.7 %
Brady Statistic AS VS Percent: 94.26 %
Implantable Lead Implant Date: 20160222
Implantable Lead Location: 753859
Implantable Pulse Generator Implant Date: 20160222
Lead Channel Impedance Value: 418 Ohm
Lead Channel Pacing Threshold Amplitude: 0.625 V
Lead Channel Pacing Threshold Amplitude: 0.875 V
Lead Channel Pacing Threshold Pulse Width: 0.4 ms
Lead Channel Sensing Intrinsic Amplitude: 19.75 mV
Lead Channel Sensing Intrinsic Amplitude: 5.125 mV
Lead Channel Sensing Intrinsic Amplitude: 5.125 mV
Lead Channel Setting Pacing Amplitude: 2 V
MDC IDC LEAD IMPLANT DT: 20160222
MDC IDC LEAD LOCATION: 753860
MDC IDC MSMT BATTERY VOLTAGE: 3.02 V
MDC IDC MSMT LEADCHNL RA IMPEDANCE VALUE: 380 Ohm
MDC IDC MSMT LEADCHNL RA PACING THRESHOLD PULSEWIDTH: 0.4 ms
MDC IDC MSMT LEADCHNL RV IMPEDANCE VALUE: 399 Ohm
MDC IDC MSMT LEADCHNL RV IMPEDANCE VALUE: 475 Ohm
MDC IDC MSMT LEADCHNL RV SENSING INTR AMPL: 19.75 mV
MDC IDC SESS DTM: 20190625140523
MDC IDC SET LEADCHNL RA PACING AMPLITUDE: 2 V
MDC IDC SET LEADCHNL RV PACING PULSEWIDTH: 0.4 ms
MDC IDC SET LEADCHNL RV SENSING SENSITIVITY: 2 mV
MDC IDC STAT BRADY AP VP PERCENT: 0.01 %
MDC IDC STAT BRADY AS VP PERCENT: 0.03 %
MDC IDC STAT BRADY RA PERCENT PACED: 5.69 %
MDC IDC STAT BRADY RV PERCENT PACED: 0.04 %

## 2017-11-21 NOTE — Progress Notes (Signed)
Paramedicine Encounter    Patient ID: Danielle [name], female    DOB: 08/07/1945, 71 y.o.   MRN: [id number]   Patient Care Team: Seward Carol, MD as PCP - General (Internal Medicine)  Patient Active Problem List   Diagnosis Date Noted  . Obesity (BMI 30-39.9) 11/11/2017  . OSA (obstructive sleep apnea) 02/18/2017  . Acute combined systolic and diastolic congestive heart failure ([place]) 02/05/2017  . Essential hypertension   . Diabetes mellitus without complication ([place])   . Cancer ([place])   . Thyroid disease     Current Outpatient Medications:  .  acetaminophen (TYLENOL) 650 MG CR tablet, Take 650 mg by mouth every 8 (eight) hours as needed for pain., Disp: , Rfl:  .  allopurinol (ZYLOPRIM) 100 MG tablet, Take 1 tablet (100 mg total) by mouth daily., Disp: , Rfl:  .  aspirin 81 MG tablet, Take 1 tablet (81 mg total) by mouth daily., Disp: 30 tablet, Rfl: 3 .  atorvastatin (LIPITOR) 80 MG tablet, Take 1 tablet (80 mg total) by mouth at bedtime., Disp: 30 tablet, Rfl: 11 .  Biotin 10 MG CAPS, Take 10 mg by mouth daily., Disp: , Rfl:  .  carvedilol (COREG) 25 MG tablet, Take 1 tablet (25 mg total) by mouth 2 (two) times daily., Disp: 180 tablet, Rfl: 3 .  CHONDROITIN SULFATE PO, Take 250 mg by mouth daily., Disp: , Rfl:  .  ferrous sulfate 325 (65 FE) MG tablet, Take 325 mg by mouth 2 (two) times daily with a meal., Disp: , Rfl:  .  ibuprofen (ADVIL,MOTRIN) 800 MG tablet, Take 800 mg by mouth every 6 (six) hours as needed for mild pain (gout/arthritis pain)., Disp: , Rfl:  .  insulin glargine (LANTUS) 100 UNIT/ML injection, Inject 30 Units into the skin 2 (two) times daily. , Disp: , Rfl:  .  insulin lispro (HUMALOG) 100 UNIT/ML injection, Inject 5 Units into the skin daily with lunch. , Disp: , Rfl:  .  isosorbide mononitrate (IMDUR) 30 MG 24 hr tablet, Take 1 tablet (30 mg total) by mouth daily., Disp: 30 tablet, Rfl: 3 .  levothyroxine (SYNTHROID, LEVOTHROID) 75 MCG tablet, Take 75 mcg by  mouth daily before breakfast., Disp: , Rfl:  .  losartan (COZAAR) 50 MG tablet, Take 1 tablet (50 mg total) by mouth daily., Disp: 30 tablet, Rfl: 3 .  metFORMIN (GLUCOPHAGE) 500 MG tablet, Take 500 mg by mouth 2 (two) times daily with a meal. , Disp: , Rfl:  .  Multiple Vitamins-Minerals (ALIVE ONCE DAILY WOMENS 50+ PO), Take 1 tablet by mouth daily., Disp: , Rfl:  .  omeprazole (PRILOSEC) 40 MG capsule, Take 40 mg by mouth daily., Disp: , Rfl:  .  potassium chloride (K-DUR,KLOR-CON) 10 MEQ tablet, Take 2 tablets (20 mEq total) by mouth daily., Disp: 60 tablet, Rfl: 5 .  spironolactone (ALDACTONE) 25 MG tablet, Take 1 tablet (25 mg total) by mouth every other day., Disp: 15 tablet, Rfl: 5 .  ticagrelor (BRILINTA) 90 MG TABS tablet, Take 1 tablet (90 mg total) by mouth 2 (two) times daily., Disp: 60 tablet, Rfl: 5 .  torsemide (DEMADEX) 20 MG tablet, Take 2 tablets (40 mg total) by mouth 2 (two) times daily., Disp: 60 tablet, Rfl: 11 .  Travoprost, BAK Free, (TRAVATAN) 0.004 % SOLN ophthalmic solution, Place 1 drop into both eyes daily., Disp: , Rfl:  .  calcium citrate (CALCITRATE - DOSED IN MG ELEMENTAL CALCIUM) 950 MG tablet, Take 1 tablet by  mouth 2 (two) times daily. , Disp: , Rfl:  .  Lactobacillus (ACIDOPHILUS PO), Take 1 capsule by mouth daily., Disp: , Rfl:  .  nitroGLYCERIN (NITROSTAT) 0.4 MG SL tablet, Place 1 tablet (0.4 mg total) under the tongue every 5 (five) minutes as needed for chest pain. (Patient not taking: Reported on 11/21/2017), Disp: 25 tablet, Rfl: 0 Allergies  Allergen Reactions  . Sulfa Antibiotics Rash  . Uloric [Febuxostat] Rash      Social History   Socioeconomic History  . Marital status: Married    Spouse name: Not on file  . Number of children: Not on file  . Years of education: Not on file  . Highest education level: Not on file  Occupational History  . Not on file  Social Needs  . Financial resource strain: Not on file  . Food insecurity:    Worry:  Not on file    Inability: Not on file  . Transportation needs:    Medical: Not on file    Non-medical: Not on file  Tobacco Use  . Smoking status: Former Smoker    Packs/day: 1.00    Years: 22.00    Pack years: 22.00    Types: Cigarettes    Last attempt to quit: 1983    Years since quitting: 36.5  . Smokeless tobacco: Never Used  Substance and Sexual Activity  . Alcohol use: Yes    Comment: ocassional  . Drug use: No  . Sexual activity: Never    Birth control/protection: None, Post-menopausal  Lifestyle  . Physical activity:    Days per week: Not on file    Minutes per session: Not on file  . Stress: Not on file  Relationships  . Social connections:    Talks on phone: Not on file    Gets together: Not on file    Attends religious service: Not on file    Active member of club or organization: Not on file    Attends meetings of clubs or organizations: Not on file    Relationship status: Not on file  . Intimate partner violence:    Fear of current or ex partner: Not on file    Emotionally abused: Not on file    Physically abused: Not on file    Forced sexual activity: Not on file  Other Topics Concern  . Not on file  Social History Narrative  . Not on file    Physical Exam  Constitutional: She is oriented to person, place, and time.  Cardiovascular: Normal rate and regular rhythm.  Pulmonary/Chest: Effort normal and breath sounds normal.  Abdominal: Soft.  Musculoskeletal: Normal range of motion. She exhibits edema.  Neurological: She is alert and oriented to person, place, and time.  Skin: Skin is warm and dry.  Psychiatric: She has a normal mood and affect.        Future Appointments  Date Time Provider [place]  11/22/2017 11:30 AM [facility] LAB [facility] None  12/20/2017 11:30 AM [facility] PA/NP [facility] None  02/18/2018  7:30 AM [facility] DEVICE REMOTES [facility]    BP 116/62 (BP Location: Right Arm, Patient Position: Sitting, Cuff  Size: Large)   Pulse 86   Resp 16   Wt 198 lb 12.8 oz (90.2 kg)   SpO2 97%   BMI 34.12 kg/m   Weight yesterday- 200 lb Last visit weight- 198 lb  Danielle [name] was seen at home today and reported feeling well. She denied SOB, headache, dizziness or orthopnea.  She stated she has been compliant with her medications. She was scheduled to see an orthopedist today regarding a cyst on her leg behind her knee. She stated that she believes that the orthopedist wants to do a knee replacement but she did not think she would be cleared for surgery. I told her I would mention this to the clinic staff but usually there is a system in place for the providers to communicate directly. She said she would ask while she is there tomorrow getting labs taken. Her medications were verified and her pillbox was refilled. She was out of Losartan so I called it into the pharmacy and she said she would be able to pick it up today and put it in her pillbox.   [name], EMT 11/21/17  ACTION: Home visit completed Next visit planned for 1 week

## 2017-11-22 ENCOUNTER — Ambulatory Visit (HOSPITAL_COMMUNITY)
Admission: RE | Admit: 2017-11-22 | Discharge: 2017-11-22 | Disposition: A | Discharge status: Home / Self Care | Payer: Medicare HMO | Source: Ambulatory Visit | Attending: Internal Medicine | Admitting: Internal Medicine

## 2017-11-22 ENCOUNTER — Telehealth (HOSPITAL_COMMUNITY): Payer: Self-pay | Admitting: Cardiology

## 2017-11-22 DIAGNOSIS — I5041 Acute combined systolic (congestive) and diastolic (congestive) heart failure: Secondary | ICD-10-CM

## 2017-11-22 DIAGNOSIS — I5042 Chronic combined systolic (congestive) and diastolic (congestive) heart failure: Secondary | ICD-10-CM

## 2017-11-22 LAB — BASIC METABOLIC PANEL
ANION GAP: 11 (ref 5–15)
BUN: 39 mg/dL — AB (ref 8–23)
CO2: 27 mmol/L (ref 22–32)
CREATININE: 1.65 mg/dL — AB (ref 0.44–1.00)
Calcium: 9 mg/dL (ref 8.9–10.3)
Chloride: 105 mmol/L (ref 98–111)
GFR calc Af Amer: 35 mL/min — ABNORMAL LOW (ref >60)
GFR calc non Af Amer: 30 mL/min — ABNORMAL LOW (ref >60)
GLUCOSE: 181 mg/dL — AB (ref 70–99)
Potassium: 4.5 mmol/L (ref 3.5–5.1)
Sodium: 143 mmol/L (ref 135–145)

## 2017-11-22 MED ORDER — TORSEMIDE 20 MG PO TABS: 20.0000 mg | ORAL_TABLET | Freq: Two times a day (BID) | ORAL | 11 refills | Status: DC

## 2017-11-22 NOTE — Telephone Encounter (Signed)
Notes recorded by Kerry Dory, CMA on 11/22/2017 at 2:36 PM EDT Patient aware. Patient reports she DID NOT increase torsemide to 40 BID, reports she has been taking 20 BID. Patient also reports her weight has been stable between 197-207, mild LE edema however she feels it is secondary to a clot/gout that is being treated by rheumatology, and denies SOB  Advised to HOLD torsemide X 2 days, return x 1 week for labs and will forward to provider for further medication adjustments if needed ------  Notes recorded by Georgiana Shore, NP on 11/22/2017 at 12:58 PM EDT Please call and check on her weights/symptoms. If weights are stable, have her told torsemide for 2 days and recheck BMET in 1 week.

## 2017-11-22 NOTE — Telephone Encounter (Signed)
-----   Message from Georgiana Shore, NP sent at 11/22/2017 12:58 PM EDT ----- Please call and check on her weights/symptoms. If weights are stable, have her told torsemide for 2 days and recheck BMET in 1 week.

## 2017-11-25 ENCOUNTER — Telehealth (HOSPITAL_COMMUNITY): Payer: Self-pay

## 2017-11-25 DIAGNOSIS — G4733 Obstructive sleep apnea (adult) (pediatric): Secondary | ICD-10-CM | POA: Diagnosis not present

## 2017-11-26 ENCOUNTER — Other Ambulatory Visit: Payer: Self-pay | Admitting: Obstetrics and Gynecology

## 2017-11-26 ENCOUNTER — Other Ambulatory Visit (HOSPITAL_COMMUNITY)
Admission: RE | Admit: 2017-11-26 | Discharge: 2017-11-26 | Disposition: A | Discharge status: Home / Self Care | Payer: Medicare HMO | Source: Ambulatory Visit | Attending: Obstetrics and Gynecology | Admitting: Obstetrics and Gynecology

## 2017-11-26 DIAGNOSIS — Z01411 Encounter for gynecological examination (general) (routine) with abnormal findings: Secondary | ICD-10-CM | POA: Diagnosis present

## 2017-11-26 DIAGNOSIS — Z01419 Encounter for gynecological examination (general) (routine) without abnormal findings: Secondary | ICD-10-CM | POA: Diagnosis not present

## 2017-11-26 NOTE — Telephone Encounter (Signed)
I called Danielle [name] to schedule an appointment. She stated she was available at 26:00 on Wednesday so we agreed to meet then.

## 2017-11-27 ENCOUNTER — Telehealth (HOSPITAL_COMMUNITY): Payer: Self-pay

## 2017-11-27 NOTE — Telephone Encounter (Signed)
I called Ms Felber to let her know I was running behind today but I would be on the way shortly. She stated she was OK and said that she would be able to wait until Friday if that worked better. We agreed to meet Friday at 14:00.

## 2017-11-28 LAB — CYTOLOGY - PAP
Diagnosis: NEGATIVE
HPV: NOT DETECTED

## 2017-11-29 ENCOUNTER — Ambulatory Visit (HOSPITAL_COMMUNITY)
Admission: RE | Admit: 2017-11-29 | Discharge: 2017-11-29 | Disposition: A | Discharge status: Home / Self Care | Payer: Medicare HMO | Source: Ambulatory Visit | Attending: Internal Medicine | Admitting: Internal Medicine

## 2017-11-29 ENCOUNTER — Telehealth (HOSPITAL_COMMUNITY): Payer: Self-pay

## 2017-11-29 ENCOUNTER — Other Ambulatory Visit (HOSPITAL_COMMUNITY): Payer: Self-pay

## 2017-11-29 DIAGNOSIS — I5041 Acute combined systolic (congestive) and diastolic (congestive) heart failure: Secondary | ICD-10-CM | POA: Diagnosis not present

## 2017-11-29 LAB — BASIC METABOLIC PANEL
ANION GAP: 8 (ref 5–15)
BUN: 30 mg/dL — ABNORMAL HIGH (ref 8–23)
CALCIUM: 9 mg/dL (ref 8.9–10.3)
CO2: 28 mmol/L (ref 22–32)
CREATININE: 1.47 mg/dL — AB (ref 0.44–1.00)
Chloride: 103 mmol/L (ref 98–111)
GFR calc Af Amer: 40 mL/min — ABNORMAL LOW (ref >60)
GFR, EST NON AFRICAN AMERICAN: 35 mL/min — AB (ref >60)
GLUCOSE: 197 mg/dL — AB (ref 70–99)
Potassium: 4.3 mmol/L (ref 3.5–5.1)
Sodium: 139 mmol/L (ref 135–145)

## 2017-11-29 NOTE — Telephone Encounter (Signed)
Zack with [facility] calling to confirm patient torsemide dosing. Today's bmet showed some improvement in Cr, however, patient states she has been taking torsemide 20 mg BID instead of the ordered 40 mg BID. Zack calling to see if we would like to keep her at 20 mg BID or increase her back to her normal daily dose of 40 mg BID. Weight at home 201 lbs. Patient due to be seen in clinic in 2 weeks. Will forward to Lillia Mountain NP-C who recently reviewed labs past few weeks to advise.  Renee Pain, RN

## 2017-11-29 NOTE — Progress Notes (Signed)
Paramedicine Encounter    Patient ID: Danielle [name], female    DOB: Apr 12, 1946, 72 y.o.   MRN: [id number]   Patient Care Team: Seward Carol, MD as PCP - General (Internal Medicine)  Patient Active Problem List   Diagnosis Date Noted  . Obesity (BMI 30-39.9) 11/11/2017  . OSA (obstructive sleep apnea) 02/18/2017  . Acute combined systolic and diastolic congestive heart failure ([place]) 02/05/2017  . Essential hypertension   . Diabetes mellitus without complication ([place])   . Cancer ([place])   . Thyroid disease     Current Outpatient Medications:  .  acetaminophen (TYLENOL) 650 MG CR tablet, Take 650 mg by mouth every 8 (eight) hours as needed for pain., Disp: , Rfl:  .  allopurinol (ZYLOPRIM) 100 MG tablet, Take 1 tablet (100 mg total) by mouth daily., Disp: , Rfl:  .  aspirin 81 MG tablet, Take 1 tablet (81 mg total) by mouth daily., Disp: 30 tablet, Rfl: 3 .  atorvastatin (LIPITOR) 80 MG tablet, Take 1 tablet (80 mg total) by mouth at bedtime., Disp: 30 tablet, Rfl: 11 .  Biotin 10 MG CAPS, Take 10 mg by mouth daily., Disp: , Rfl:  .  carvedilol (COREG) 25 MG tablet, Take 1 tablet (25 mg total) by mouth 2 (two) times daily., Disp: 180 tablet, Rfl: 3 .  CHONDROITIN SULFATE PO, Take 250 mg by mouth daily., Disp: , Rfl:  .  ferrous sulfate 325 (65 FE) MG tablet, Take 325 mg by mouth 2 (two) times daily with a meal., Disp: , Rfl:  .  insulin glargine (LANTUS) 100 UNIT/ML injection, Inject 30 Units into the skin 2 (two) times daily. , Disp: , Rfl:  .  insulin lispro (HUMALOG) 100 UNIT/ML injection, Inject 5 Units into the skin daily with lunch. , Disp: , Rfl:  .  isosorbide mononitrate (IMDUR) 30 MG 24 hr tablet, Take 1 tablet (30 mg total) by mouth daily., Disp: 30 tablet, Rfl: 3 .  levothyroxine (SYNTHROID, LEVOTHROID) 75 MCG tablet, Take 75 mcg by mouth daily before breakfast., Disp: , Rfl:  .  losartan (COZAAR) 50 MG tablet, Take 1 tablet (50 mg total) by mouth daily., Disp: 30 tablet, Rfl:  3 .  metFORMIN (GLUCOPHAGE) 500 MG tablet, Take 500 mg by mouth 2 (two) times daily with a meal. , Disp: , Rfl:  .  Multiple Vitamins-Minerals (ALIVE ONCE DAILY WOMENS 50+ PO), Take 1 tablet by mouth daily., Disp: , Rfl:  .  omeprazole (PRILOSEC) 40 MG capsule, Take 40 mg by mouth daily., Disp: , Rfl:  .  potassium chloride (K-DUR,KLOR-CON) 10 MEQ tablet, Take 2 tablets (20 mEq total) by mouth daily., Disp: 60 tablet, Rfl: 5 .  spironolactone (ALDACTONE) 25 MG tablet, Take 1 tablet (25 mg total) by mouth every other day., Disp: 15 tablet, Rfl: 5 .  ticagrelor (BRILINTA) 90 MG TABS tablet, Take 1 tablet (90 mg total) by mouth 2 (two) times daily., Disp: 60 tablet, Rfl: 5 .  Travoprost, BAK Free, (TRAVATAN) 0.004 % SOLN ophthalmic solution, Place 1 drop into both eyes daily., Disp: , Rfl:  .  calcium citrate (CALCITRATE - DOSED IN MG ELEMENTAL CALCIUM) 950 MG tablet, Take 1 tablet by mouth 2 (two) times daily. , Disp: , Rfl:  .  ibuprofen (ADVIL,MOTRIN) 800 MG tablet, Take 800 mg by mouth every 6 (six) hours as needed for mild pain (gout/arthritis pain)., Disp: , Rfl:  .  Lactobacillus (ACIDOPHILUS PO), Take 1 capsule by mouth daily., Disp: , Rfl:  .  nitroGLYCERIN (NITROSTAT) 0.4 MG SL tablet, Place 1 tablet (0.4 mg total) under the tongue every 5 (five) minutes as needed for chest pain. (Patient not taking: Reported on 11/21/2017), Disp: 25 tablet, Rfl: 0 .  torsemide (DEMADEX) 20 MG tablet, Take 1 tablet (20 mg total) by mouth 2 (two) times daily., Disp: 60 tablet, Rfl: 11 Allergies  Allergen Reactions  . Sulfa Antibiotics Rash  . Uloric [Febuxostat] Rash      Social History   Socioeconomic History  . Marital status: Married    Spouse name: Not on file  . Number of children: Not on file  . Years of education: Not on file  . Highest education level: Not on file  Occupational History  . Not on file  Social Needs  . Financial resource strain: Not on file  . Food insecurity:    Worry:  Not on file    Inability: Not on file  . Transportation needs:    Medical: Not on file    Non-medical: Not on file  Tobacco Use  . Smoking status: Former Smoker    Packs/day: 1.00    Years: 22.00    Pack years: 22.00    Types: Cigarettes    Last attempt to quit: 1983    Years since quitting: 36.5  . Smokeless tobacco: Never Used  Substance and Sexual Activity  . Alcohol use: Yes    Comment: ocassional  . Drug use: No  . Sexual activity: Never    Birth control/protection: None, Post-menopausal  Lifestyle  . Physical activity:    Days per week: Not on file    Minutes per session: Not on file  . Stress: Not on file  Relationships  . Social connections:    Talks on phone: Not on file    Gets together: Not on file    Attends religious service: Not on file    Active member of club or organization: Not on file    Attends meetings of clubs or organizations: Not on file    Relationship status: Not on file  . Intimate partner violence:    Fear of current or ex partner: Not on file    Emotionally abused: Not on file    Physically abused: Not on file    Forced sexual activity: Not on file  Other Topics Concern  . Not on file  Social History Narrative  . Not on file    Physical Exam  Constitutional: She is oriented to person, place, and time.  Cardiovascular: Normal rate and regular rhythm.  Pulmonary/Chest: Effort normal and breath sounds normal.  Abdominal: Soft.  Musculoskeletal: Normal range of motion. She exhibits edema.  Neurological: She is alert and oriented to person, place, and time.  Skin: Skin is warm and dry.  Psychiatric: She has a normal mood and affect.        Future Appointments  Date Time Provider [place]  12/20/2017 11:30 AM [facility] PA/NP [facility] None  02/18/2018  7:30 AM [facility] DEVICE REMOTES [facility]    BP (!) 106/58 (BP Location: Right Arm, Patient Position: Sitting, Cuff Size: Large)   Pulse 76   Resp 16   Wt  201 lb 12.8 oz (91.5 kg)   SpO2 96%   BMI 34.64 kg/m   Weight yesterday- 202 lb Last visit weight- 198 lb   Ms Ruddell was seen at home today and reported feeling well. She stated she has been taking medications but only half of her prescribed torsemide since last  week. This is because she was told to stop taking it over last weekend due to elevated creatinine and then restart on Monday. On Monday she stated she began taking 20 mg BID instead of the 40 mg BID as ordered. She said she did this because she was worried about restarting without having new labs done. She was in the clinic for labs today which showed her creatinine was still elevated. I contacted the clinic who advised to continue 20 mg BID for now. Her only complaint today is her leg pain which she is seeing a specialist about. Her medications were verified and her pillbox was refilled.   [name], EMT 11/29/17  ACTION: Home visit completed Next visit planned for 1 week

## 2017-11-29 NOTE — Telephone Encounter (Signed)
Her weight in May was 196 lbs, so lets have her take torsemide 40 mg am, 20 mg pm until we see her in 2 weeks.  We can recheck BMET then. Thanks.

## 2017-12-02 NOTE — Telephone Encounter (Signed)
Attempted to call patient, no answer, will forward to [facility] with paramed to make him aware.

## 2017-12-03 DIAGNOSIS — M1711 Unilateral primary osteoarthritis, right knee: Secondary | ICD-10-CM | POA: Diagnosis not present

## 2017-12-05 ENCOUNTER — Other Ambulatory Visit (HOSPITAL_COMMUNITY): Payer: Self-pay

## 2017-12-05 NOTE — Progress Notes (Signed)
Paramedicine Encounter    Patient ID: Danielle [name], female    DOB: 07/12/1945, 72 y.o.   MRN: [id number]   Patient Care Team: Seward Carol, MD as PCP - General (Internal Medicine)  Patient Active Problem List   Diagnosis Date Noted  . Obesity (BMI 30-39.9) 11/11/2017  . OSA (obstructive sleep apnea) 02/18/2017  . Acute combined systolic and diastolic congestive heart failure ([place]) 02/05/2017  . Essential hypertension   . Diabetes mellitus without complication ([place])   . Cancer ([place])   . Thyroid disease     Current Outpatient Medications:  .  acetaminophen (TYLENOL) 650 MG CR tablet, Take 650 mg by mouth every 8 (eight) hours as needed for pain., Disp: , Rfl:  .  allopurinol (ZYLOPRIM) 100 MG tablet, Take 1 tablet (100 mg total) by mouth daily., Disp: , Rfl:  .  aspirin 81 MG tablet, Take 1 tablet (81 mg total) by mouth daily., Disp: 30 tablet, Rfl: 3 .  atorvastatin (LIPITOR) 80 MG tablet, Take 1 tablet (80 mg total) by mouth at bedtime., Disp: 30 tablet, Rfl: 11 .  Biotin 10 MG CAPS, Take 10 mg by mouth daily., Disp: , Rfl:  .  carvedilol (COREG) 25 MG tablet, Take 1 tablet (25 mg total) by mouth 2 (two) times daily., Disp: 180 tablet, Rfl: 3 .  ferrous sulfate 325 (65 FE) MG tablet, Take 325 mg by mouth 2 (two) times daily with a meal., Disp: , Rfl:  .  insulin glargine (LANTUS) 100 UNIT/ML injection, Inject 30 Units into the skin 2 (two) times daily. , Disp: , Rfl:  .  insulin lispro (HUMALOG) 100 UNIT/ML injection, Inject 5 Units into the skin daily with lunch. , Disp: , Rfl:  .  isosorbide mononitrate (IMDUR) 30 MG 24 hr tablet, Take 1 tablet (30 mg total) by mouth daily., Disp: 30 tablet, Rfl: 3 .  levothyroxine (SYNTHROID, LEVOTHROID) 75 MCG tablet, Take 75 mcg by mouth daily before breakfast., Disp: , Rfl:  .  losartan (COZAAR) 50 MG tablet, Take 1 tablet (50 mg total) by mouth daily., Disp: 30 tablet, Rfl: 3 .  metFORMIN (GLUCOPHAGE) 500 MG tablet, Take 500 mg by mouth 2  (two) times daily with a meal. , Disp: , Rfl:  .  Multiple Vitamins-Minerals (ALIVE ONCE DAILY WOMENS 50+ PO), Take 1 tablet by mouth daily., Disp: , Rfl:  .  nitroGLYCERIN (NITROSTAT) 0.4 MG SL tablet, Place 1 tablet (0.4 mg total) under the tongue every 5 (five) minutes as needed for chest pain., Disp: 25 tablet, Rfl: 0 .  omeprazole (PRILOSEC) 40 MG capsule, Take 40 mg by mouth daily., Disp: , Rfl:  .  potassium chloride (K-DUR,KLOR-CON) 10 MEQ tablet, Take 2 tablets (20 mEq total) by mouth daily., Disp: 60 tablet, Rfl: 5 .  spironolactone (ALDACTONE) 25 MG tablet, Take 1 tablet (25 mg total) by mouth every other day., Disp: 15 tablet, Rfl: 5 .  ticagrelor (BRILINTA) 90 MG TABS tablet, Take 1 tablet (90 mg total) by mouth 2 (two) times daily., Disp: 60 tablet, Rfl: 5 .  torsemide (DEMADEX) 20 MG tablet, Take 1 tablet (20 mg total) by mouth 2 (two) times daily., Disp: 60 tablet, Rfl: 11 .  Travoprost, BAK Free, (TRAVATAN) 0.004 % SOLN ophthalmic solution, Place 1 drop into both eyes daily., Disp: , Rfl:  .  calcium citrate (CALCITRATE - DOSED IN MG ELEMENTAL CALCIUM) 950 MG tablet, Take 1 tablet by mouth 2 (two) times daily. , Disp: , Rfl:  .  CHONDROITIN SULFATE PO, Take 250 mg by mouth daily., Disp: , Rfl:  .  ibuprofen (ADVIL,MOTRIN) 800 MG tablet, Take 800 mg by mouth every 6 (six) hours as needed for mild pain (gout/arthritis pain)., Disp: , Rfl:  .  Lactobacillus (ACIDOPHILUS PO), Take 1 capsule by mouth daily., Disp: , Rfl:  Allergies  Allergen Reactions  . Sulfa Antibiotics Rash  . Uloric [Febuxostat] Rash      Social History   Socioeconomic History  . Marital status: Married    Spouse name: Not on file  . Number of children: Not on file  . Years of education: Not on file  . Highest education level: Not on file  Occupational History  . Not on file  Social Needs  . Financial resource strain: Not on file  . Food insecurity:    Worry: Not on file    Inability: Not on file   . Transportation needs:    Medical: Not on file    Non-medical: Not on file  Tobacco Use  . Smoking status: Former Smoker    Packs/day: 1.00    Years: 22.00    Pack years: 22.00    Types: Cigarettes    Last attempt to quit: 1983    Years since quitting: 36.5  . Smokeless tobacco: Never Used  Substance and Sexual Activity  . Alcohol use: Yes    Comment: ocassional  . Drug use: No  . Sexual activity: Never    Birth control/protection: None, Post-menopausal  Lifestyle  . Physical activity:    Days per week: Not on file    Minutes per session: Not on file  . Stress: Not on file  Relationships  . Social connections:    Talks on phone: Not on file    Gets together: Not on file    Attends religious service: Not on file    Active member of club or organization: Not on file    Attends meetings of clubs or organizations: Not on file    Relationship status: Not on file  . Intimate partner violence:    Fear of current or ex partner: Not on file    Emotionally abused: Not on file    Physically abused: Not on file    Forced sexual activity: Not on file  Other Topics Concern  . Not on file  Social History Narrative  . Not on file    Physical Exam  Constitutional: She is oriented to person, place, and time.  Cardiovascular: Normal rate and regular rhythm.  Pulmonary/Chest: Effort normal and breath sounds normal.  Abdominal: Soft.  Musculoskeletal: Normal range of motion. She exhibits edema.  Neurological: She is alert and oriented to person, place, and time.  Skin: Skin is warm and dry.  Psychiatric: She has a normal mood and affect.        Future Appointments  Date Time Provider [place]  12/20/2017 11:30 AM [facility] PA/NP [facility] None  02/18/2018  7:30 AM [facility] DEVICE REMOTES [facility]    BP (!) 100/58 (BP Location: Right Arm, Patient Position: Sitting, Cuff Size: Large)   Pulse 77   Resp 16   Wt 204 lb (92.5 kg)   SpO2 96%   BMI  35.02 kg/m   Weight yesterday- 203 lb Last visit weight- 201 lb  Ms Fluhr was seen at home today and reported feeling well. She denied SOB, headache, dizziness or orthopnea. She reported being compliant with her medications over the past week as well. She did report  one episode of chest pain which occurred last week. She took SL nitro x 2 with complete resolution of pain. I encouraged her to contact myself, the clinic or 911 if this happens again and she agreed. Her medications were verified and her pillbox was refilled.   [name], EMT 12/05/17  ACTION: Home visit completed Next visit planned for 1 week

## 2017-12-11 ENCOUNTER — Telehealth (HOSPITAL_COMMUNITY): Payer: Self-pay

## 2017-12-11 NOTE — Telephone Encounter (Signed)
I called Danielle [name] to schedule an appointment for tomorrow. She stated she would be available in the morning so we agreed to meet at 10:00.

## 2017-12-12 ENCOUNTER — Other Ambulatory Visit (HOSPITAL_COMMUNITY): Payer: Self-pay

## 2017-12-12 DIAGNOSIS — M1711 Unilateral primary osteoarthritis, right knee: Secondary | ICD-10-CM | POA: Diagnosis not present

## 2017-12-12 NOTE — Progress Notes (Signed)
Paramedicine Encounter    Patient ID: Danielle [name], female    DOB: 1946-01-16, 72 y.o.   MRN: [id number]   Patient Care Team: Seward Carol, MD as PCP - General (Internal Medicine)  Patient Active Problem List   Diagnosis Date Noted  . Obesity (BMI 30-39.9) 11/11/2017  . OSA (obstructive sleep apnea) 02/18/2017  . Acute combined systolic and diastolic congestive heart failure ([place]) 02/05/2017  . Essential hypertension   . Diabetes mellitus without complication ([place])   . Cancer ([place])   . Thyroid disease     Current Outpatient Medications:  .  acetaminophen (TYLENOL) 650 MG CR tablet, Take 650 mg by mouth every 8 (eight) hours as needed for pain., Disp: , Rfl:  .  allopurinol (ZYLOPRIM) 100 MG tablet, Take 1 tablet (100 mg total) by mouth daily., Disp: , Rfl:  .  aspirin 81 MG tablet, Take 1 tablet (81 mg total) by mouth daily., Disp: 30 tablet, Rfl: 3 .  atorvastatin (LIPITOR) 80 MG tablet, Take 1 tablet (80 mg total) by mouth at bedtime., Disp: 30 tablet, Rfl: 11 .  Biotin 10 MG CAPS, Take 10 mg by mouth daily., Disp: , Rfl:  .  carvedilol (COREG) 25 MG tablet, Take 1 tablet (25 mg total) by mouth 2 (two) times daily., Disp: 180 tablet, Rfl: 3 .  ferrous sulfate 325 (65 FE) MG tablet, Take 325 mg by mouth 2 (two) times daily with a meal., Disp: , Rfl:  .  insulin glargine (LANTUS) 100 UNIT/ML injection, Inject 30 Units into the skin 2 (two) times daily. , Disp: , Rfl:  .  insulin lispro (HUMALOG) 100 UNIT/ML injection, Inject 5 Units into the skin daily with lunch. , Disp: , Rfl:  .  isosorbide mononitrate (IMDUR) 30 MG 24 hr tablet, Take 1 tablet (30 mg total) by mouth daily., Disp: 30 tablet, Rfl: 3 .  levothyroxine (SYNTHROID, LEVOTHROID) 75 MCG tablet, Take 75 mcg by mouth daily before breakfast., Disp: , Rfl:  .  losartan (COZAAR) 50 MG tablet, Take 1 tablet (50 mg total) by mouth daily., Disp: 30 tablet, Rfl: 3 .  metFORMIN (GLUCOPHAGE) 500 MG tablet, Take 500 mg by mouth 2  (two) times daily with a meal. , Disp: , Rfl:  .  Multiple Vitamins-Minerals (ALIVE ONCE DAILY WOMENS 50+ PO), Take 1 tablet by mouth daily., Disp: , Rfl:  .  omeprazole (PRILOSEC) 40 MG capsule, Take 40 mg by mouth daily., Disp: , Rfl:  .  potassium chloride (K-DUR,KLOR-CON) 10 MEQ tablet, Take 2 tablets (20 mEq total) by mouth daily., Disp: 60 tablet, Rfl: 5 .  spironolactone (ALDACTONE) 25 MG tablet, Take 1 tablet (25 mg total) by mouth every other day., Disp: 15 tablet, Rfl: 5 .  ticagrelor (BRILINTA) 90 MG TABS tablet, Take 1 tablet (90 mg total) by mouth 2 (two) times daily., Disp: 60 tablet, Rfl: 5 .  torsemide (DEMADEX) 20 MG tablet, Take 1 tablet (20 mg total) by mouth 2 (two) times daily., Disp: 60 tablet, Rfl: 11 .  Travoprost, BAK Free, (TRAVATAN) 0.004 % SOLN ophthalmic solution, Place 1 drop into both eyes daily., Disp: , Rfl:  .  calcium citrate (CALCITRATE - DOSED IN MG ELEMENTAL CALCIUM) 950 MG tablet, Take 1 tablet by mouth 2 (two) times daily. , Disp: , Rfl:  .  CHONDROITIN SULFATE PO, Take 250 mg by mouth daily., Disp: , Rfl:  .  ibuprofen (ADVIL,MOTRIN) 800 MG tablet, Take 800 mg by mouth every 6 (six) hours as  needed for mild pain (gout/arthritis pain)., Disp: , Rfl:  .  Lactobacillus (ACIDOPHILUS PO), Take 1 capsule by mouth daily., Disp: , Rfl:  .  nitroGLYCERIN (NITROSTAT) 0.4 MG SL tablet, Place 1 tablet (0.4 mg total) under the tongue every 5 (five) minutes as needed for chest pain. (Patient not taking: Reported on 12/12/2017), Disp: 25 tablet, Rfl: 0 Allergies  Allergen Reactions  . Sulfa Antibiotics Rash  . Uloric [Febuxostat] Rash      Social History   Socioeconomic History  . Marital status: Married    Spouse name: Not on file  . Number of children: Not on file  . Years of education: Not on file  . Highest education level: Not on file  Occupational History  . Not on file  Social Needs  . Financial resource strain: Not on file  . Food insecurity:     Worry: Not on file    Inability: Not on file  . Transportation needs:    Medical: Not on file    Non-medical: Not on file  Tobacco Use  . Smoking status: Former Smoker    Packs/day: 1.00    Years: 22.00    Pack years: 22.00    Types: Cigarettes    Last attempt to quit: 1983    Years since quitting: 36.5  . Smokeless tobacco: Never Used  Substance and Sexual Activity  . Alcohol use: Yes    Comment: ocassional  . Drug use: No  . Sexual activity: Never    Birth control/protection: None, Post-menopausal  Lifestyle  . Physical activity:    Days per week: Not on file    Minutes per session: Not on file  . Stress: Not on file  Relationships  . Social connections:    Talks on phone: Not on file    Gets together: Not on file    Attends religious service: Not on file    Active member of club or organization: Not on file    Attends meetings of clubs or organizations: Not on file    Relationship status: Not on file  . Intimate partner violence:    Fear of current or ex partner: Not on file    Emotionally abused: Not on file    Physically abused: Not on file    Forced sexual activity: Not on file  Other Topics Concern  . Not on file  Social History Narrative  . Not on file    Physical Exam  Constitutional: She is oriented to person, place, and time.  Cardiovascular: Normal rate and regular rhythm.  Pulmonary/Chest: Effort normal and breath sounds normal.  Abdominal: Soft. She exhibits no distension.  Musculoskeletal: Normal range of motion. She exhibits edema.  Neurological: She is alert and oriented to person, place, and time.  Skin: Skin is warm and dry.  Psychiatric: She has a normal mood and affect.        Future Appointments  Date Time Provider [place]  12/20/2017 11:30 AM [facility] PA/NP [facility] None  02/18/2018  7:30 AM [facility] DEVICE REMOTES [facility]    BP (!) 100/56 (BP Location: Right Arm, Patient Position: Sitting, Cuff Size:  Large)   Pulse 72   Resp 18   Wt 201 lb 6.4 oz (91.4 kg)   SpO2 97%   BMI 34.57 kg/m   Weight yesterday- 201 lb Last visit weight- 204 lb  Ms Burnsworth was seen at home today and reported feeling generally well. She denied SOB, headache, dizziness or orthopnea. She continues  to have pain in her right leg resulting from a cyst. She says she has not been able to have the cyst removed because her physician does not want to do surgery. Her Medications were verified and her pillbox was refilled. Necessary medications were ordered and she was left with detailed written instructions on where to put the pills when she gets them.   [name], EMT 12/12/17  ACTION: Home visit completed Next visit planned for 1 week

## 2017-12-17 DIAGNOSIS — M1711 Unilateral primary osteoarthritis, right knee: Secondary | ICD-10-CM | POA: Diagnosis not present

## 2017-12-19 ENCOUNTER — Telehealth (HOSPITAL_COMMUNITY): Payer: Self-pay

## 2017-12-19 DIAGNOSIS — I5022 Chronic systolic (congestive) heart failure: Secondary | ICD-10-CM | POA: Diagnosis not present

## 2017-12-19 DIAGNOSIS — L84 Corns and callosities: Secondary | ICD-10-CM | POA: Diagnosis not present

## 2017-12-19 DIAGNOSIS — I10 Essential (primary) hypertension: Secondary | ICD-10-CM | POA: Diagnosis not present

## 2017-12-19 DIAGNOSIS — E039 Hypothyroidism, unspecified: Secondary | ICD-10-CM | POA: Diagnosis not present

## 2017-12-19 DIAGNOSIS — E2839 Other primary ovarian failure: Secondary | ICD-10-CM | POA: Diagnosis not present

## 2017-12-19 DIAGNOSIS — M7121 Synovial cyst of popliteal space [Baker], right knee: Secondary | ICD-10-CM | POA: Diagnosis not present

## 2017-12-19 DIAGNOSIS — I2510 Atherosclerotic heart disease of native coronary artery without angina pectoris: Secondary | ICD-10-CM | POA: Diagnosis not present

## 2017-12-19 DIAGNOSIS — Z6836 Body mass index (BMI) 36.0-36.9, adult: Secondary | ICD-10-CM | POA: Diagnosis not present

## 2017-12-19 DIAGNOSIS — E1140 Type 2 diabetes mellitus with diabetic neuropathy, unspecified: Secondary | ICD-10-CM | POA: Diagnosis not present

## 2017-12-19 NOTE — Progress Notes (Signed)
Advanced Heart Failure Clinic Note    Primary Care: Dr. Delfina Redwood Primary Cardiologist: Dr. Haroldine Laws  EP: Dr Tomasa Blase  HPI:  Danielle [name] is a 72 y.o. female with a past medical history of systolic CHF (EF 73-22%), CAD, ischemic cardiomyopathy, HTN, DM, hypothyroidism, and OSA sleep study 03/2017.   Admitted 02/03/17-02/06/17 with SOB, acute on chronic systolic CHF. Diuresed with IV lasix (12 pounds), discharge weight was 215 pounds.   She presents today for regular follow up. Last visit sent for [place] with RLE pain. Cystic structure noted with possible rupture., plan was for PCP follow up. 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 PCP following. Taking tylenol, which is not very helpful. She was under the impression that she was not a candidate for any type of surgery given her HF. She has no complaints from a HF perspective. Denies SOB, PND, or Orthopnea. No fever or chills. Weight stable at homes. Taking all medications as directed.   ECHO 01/2017- EF 45-50% Grade IIDD Left Atrium Severely dilated.   Labs from [facility] 08/01/2017 Hgb 8.9 Cholesterol 89 HDL 38 TG 91 K 4.8 Creatinine 1.0   Review of systems complete and found to be negative unless listed in HPI.    Past Medical History:  Diagnosis Date  . Acute combined systolic and diastolic congestive heart failure ([place]) 02/05/2017  . Arthritis   . Blood dyscrasia    per pt-has small blood cells-appears as if anemic  . Breast cancer ([place])   . Cancer (HCC)    breast  . CHF (congestive heart failure) ([place])   . Complication of anesthesia    difficult to awaken from per pt  . Diabetes mellitus without complication ([place])   . Dyspnea   . Essential hypertension   . Heart disease   . Hypothyroidism   . Obesity (BMI 30-39.9) 11/11/2017  . OSA (obstructive sleep apnea) 02/18/2017    severe obstructive sleep apnea with an AHI of 75.6/h and mild central sleep apnea with a CAI of 7.7/h.  Oxygen saturations dropped as low as 82%.   He is on CPAP at 9 cm H2O.  .  Personal history of chemotherapy   . Personal history of radiation therapy   . Presence of permanent cardiac pacemaker   . Sleep apnea   . Thyroid disease    hypothyroid    Current Outpatient Medications  Medication Sig Dispense Refill  . acetaminophen (TYLENOL) 650 MG CR tablet Take 650 mg by mouth every 8 (eight) hours as needed for pain.    Marland Kitchen allopurinol (ZYLOPRIM) 100 MG tablet Take 1 tablet (100 mg total) by mouth daily.    Marland Kitchen aspirin 81 MG tablet Take 1 tablet (81 mg total) by mouth daily. 30 tablet 3  . atorvastatin (LIPITOR) 80 MG tablet Take 1 tablet (80 mg total) by mouth at bedtime. 30 tablet 11  . Biotin 10 MG CAPS Take 10 mg by mouth daily.    . calcium citrate (CALCITRATE - DOSED IN MG ELEMENTAL CALCIUM) 950 MG tablet Take 1 tablet by mouth 2 (two) times daily.     . carvedilol (COREG) 25 MG tablet Take 1 tablet (25 mg total) by mouth 2 (two) times daily. 180 tablet 3  . CHONDROITIN SULFATE PO Take 250 mg by mouth daily.    . ferrous sulfate 325 (65 FE) MG tablet Take 325 mg by mouth 2 (two) times daily with a meal.    . ibuprofen (ADVIL,MOTRIN) 800 MG tablet Take 800 mg by mouth every  6 (six) hours as needed for mild pain (gout/arthritis pain).    . insulin glargine (LANTUS) 100 UNIT/ML injection Inject 30 Units into the skin 2 (two) times daily.     . insulin lispro (HUMALOG) 100 UNIT/ML injection Inject 5 Units into the skin daily with lunch.     . isosorbide mononitrate (IMDUR) 30 MG 24 hr tablet Take 1 tablet (30 mg total) by mouth daily. 30 tablet 3  . Lactobacillus (ACIDOPHILUS PO) Take 1 capsule by mouth daily.    Marland Kitchen levothyroxine (SYNTHROID, LEVOTHROID) 75 MCG tablet Take 75 mcg by mouth daily before breakfast.    . losartan (COZAAR) 50 MG tablet Take 1 tablet (50 mg total) by mouth daily. 30 tablet 3  . metFORMIN (GLUCOPHAGE) 500 MG tablet Take 500 mg by mouth 2 (two) times daily with a meal.     . Multiple Vitamins-Minerals (ALIVE ONCE DAILY WOMENS 50+ PO) Take 1  tablet by mouth daily.    . nitroGLYCERIN (NITROSTAT) 0.4 MG SL tablet Place 1 tablet (0.4 mg total) under the tongue every 5 (five) minutes as needed for chest pain. 25 tablet 0  . omeprazole (PRILOSEC) 40 MG capsule Take 40 mg by mouth daily.    . potassium chloride (K-DUR,KLOR-CON) 10 MEQ tablet Take 2 tablets (20 mEq total) by mouth daily. 60 tablet 5  . spironolactone (ALDACTONE) 25 MG tablet Take 1 tablet (25 mg total) by mouth every other day. 15 tablet 5  . ticagrelor (BRILINTA) 90 MG TABS tablet Take 1 tablet (90 mg total) by mouth 2 (two) times daily. 60 tablet 5  . torsemide (DEMADEX) 20 MG tablet Take 1 tablet (20 mg total) by mouth 2 (two) times daily. 60 tablet 11  . Travoprost, BAK Free, (TRAVATAN) 0.004 % SOLN ophthalmic solution Place 1 drop into both eyes daily.     No current facility-administered medications for this encounter.    Allergies  Allergen Reactions  . Sulfa Antibiotics Rash  . Uloric [Febuxostat] Rash   Social History   Socioeconomic History  . Marital status: Married    Spouse name: Not on file  . Number of children: Not on file  . Years of education: Not on file  . Highest education level: Not on file  Occupational History  . Not on file  Social Needs  . Financial resource strain: Not on file  . Food insecurity:    Worry: Not on file    Inability: Not on file  . Transportation needs:    Medical: Not on file    Non-medical: Not on file  Tobacco Use  . Smoking status: Former Smoker    Packs/day: 1.00    Years: 22.00    Pack years: 22.00    Types: Cigarettes    Last attempt to quit: 1983    Years since quitting: 36.5  . Smokeless tobacco: Never Used  Substance and Sexual Activity  . Alcohol use: Yes    Comment: ocassional  . Drug use: No  . Sexual activity: Never    Birth control/protection: None, Post-menopausal  Lifestyle  . Physical activity:    Days per week: Not on file    Minutes per session: Not on file  . Stress: Not on file    Relationships  . Social connections:    Talks on phone: Not on file    Gets together: Not on file    Attends religious service: Not on file    Active member of club or organization: Not on  file    Attends meetings of clubs or organizations: Not on file    Relationship status: Not on file  . Intimate partner violence:    Fear of current or ex partner: Not on file    Emotionally abused: Not on file    Physically abused: Not on file    Forced sexual activity: Not on file  Other Topics Concern  . Not on file  Social History Narrative  . Not on file   Family History  Problem Relation Age of Onset  . Multiple myeloma Mother   . Heart disease Father   . Other Sister   . Heart disease Brother   . Diabetes Sister   . Other Sister    Vitals:   12/20/17 1132  BP: (!) 148/68  Pulse: 64  SpO2: 96%  Weight: 208 lb (94.3 kg)     Wt Readings from Last 3 Encounters:  12/20/17 208 lb (94.3 kg)  12/12/17 201 lb 6.4 oz (91.4 kg)  12/05/17 204 lb (92.5 kg)    PHYSICAL EXAM: General: Well appearing. No resp difficulty. HEENT: Normal Neck: Supple. JVP 5-6. Carotids 2+ bilat; no bruits. No thyromegaly or nodule noted. Cor: PMI nondisplaced. RRR, No M/G/R noted Lungs: CTAB, normal effort. Abdomen: Soft, non-tender, non-distended, no HSM. No bruits or masses. +BS  Extremities: No cyanosis, clubbing, or rash. Trace to 1+ edema R>L LE. Fullness behind R knee.  Neuro: Alert & orientedx3, cranial nerves grossly intact. moves all 4 extremities w/o difficulty. Affect pleasant   ASSESSMENT & PLAN:  1. Chronic systolic CHF: ICM, Echo 5/85/27 EF 45-50%, LA severely dilated, restrictive CM pattern.  - NYHA II symptoms, confounded by joint pain - Volume status stable on exam.   - Continue torsemide 40 mg BID for now. BMET today.  - Continue imdur 30 mg daily - Continue losartan 50 mg daily. - Continue 25 mg spironolactone daily.  - Continue Coreg 25 mg twice a day.    - Reinforced fluid  restriction to < 2 L daily, sodium restriction to less than 2000 mg daily, and the importance of daily weights.    2. CAD: Stenting in 2010, 2012 and 2016. Records pending from [place]. - No s/s of ischemia.    - Continue ASA and statin.   3. HTN - Meds as above.   4. DM - Per Dr. Delfina Redwood.   5. PPM - Dr. Curt Bears now following. No change.   6. Gout - Per PCP.  - Has follow up with Rheumatology   7. Arthritis - Continue tylenol. Per PCP.   8. OSA-  - Encouraged nightly CPAP.  - Follows with Dr Radford Pax  9. RLE Pain - Cystic structure noted in popliteal fossa on [place] 11/15/17. Forwarded to PCP for possible ruptured cyst.  - Also confounded by severe arthritis. She has been told she would need knee replacement, but was under impression she was not candidate given her HF.  - With near normal EF, she would be at only moderate risk (6.6%) for perioperative complications from a cardiac standpoint under Revised Cardiac Risk Index [place] Criteria). Will repeat Echo.   Shirley Friar, PA-C 12/20/17   Greater than 50% of the 30 minute visit was spent in counseling/coordination of care regarding disease state education, salt/fluid restriction, sliding scale diuretics, and medication compliance.

## 2017-12-19 NOTE — Telephone Encounter (Signed)
I called Danielle [name] to schedule an appointment. She stated she was at another doctors office today and would be able to see me tomorrow at the heart failure clinic. I reminded her to bring her medications and pillbox so I could make any adjustments necessary while we are there. She was agreeable.

## 2017-12-20 ENCOUNTER — Encounter (HOSPITAL_COMMUNITY): Payer: Self-pay

## 2017-12-20 ENCOUNTER — Ambulatory Visit (HOSPITAL_COMMUNITY)
Admission: RE | Admit: 2017-12-20 | Discharge: 2017-12-20 | Disposition: A | Discharge status: Home / Self Care | Payer: Medicare HMO | Source: Ambulatory Visit | Attending: Cardiology | Admitting: Cardiology

## 2017-12-20 ENCOUNTER — Other Ambulatory Visit (HOSPITAL_COMMUNITY): Payer: Self-pay

## 2017-12-20 ENCOUNTER — Other Ambulatory Visit: Payer: Self-pay | Admitting: Cardiology

## 2017-12-20 VITALS — BP 148/68 | HR 64 | Wt 208.0 lb

## 2017-12-20 DIAGNOSIS — Z955 Presence of coronary angioplasty implant and graft: Secondary | ICD-10-CM | POA: Diagnosis not present

## 2017-12-20 DIAGNOSIS — Z950 Presence of cardiac pacemaker: Secondary | ICD-10-CM | POA: Insufficient documentation

## 2017-12-20 DIAGNOSIS — Z87891 Personal history of nicotine dependence: Secondary | ICD-10-CM | POA: Diagnosis not present

## 2017-12-20 DIAGNOSIS — G4733 Obstructive sleep apnea (adult) (pediatric): Secondary | ICD-10-CM | POA: Diagnosis not present

## 2017-12-20 DIAGNOSIS — M79606 Pain in leg, unspecified: Secondary | ICD-10-CM | POA: Diagnosis not present

## 2017-12-20 DIAGNOSIS — I10 Essential (primary) hypertension: Secondary | ICD-10-CM

## 2017-12-20 DIAGNOSIS — Z833 Family history of diabetes mellitus: Secondary | ICD-10-CM | POA: Insufficient documentation

## 2017-12-20 DIAGNOSIS — M1990 Unspecified osteoarthritis, unspecified site: Secondary | ICD-10-CM | POA: Diagnosis not present

## 2017-12-20 DIAGNOSIS — E669 Obesity, unspecified: Secondary | ICD-10-CM

## 2017-12-20 DIAGNOSIS — I110 Hypertensive heart disease with heart failure: Secondary | ICD-10-CM | POA: Diagnosis not present

## 2017-12-20 DIAGNOSIS — I5032 Chronic diastolic (congestive) heart failure: Secondary | ICD-10-CM

## 2017-12-20 DIAGNOSIS — Z8249 Family history of ischemic heart disease and other diseases of the circulatory system: Secondary | ICD-10-CM | POA: Diagnosis not present

## 2017-12-20 DIAGNOSIS — Z9221 Personal history of antineoplastic chemotherapy: Secondary | ICD-10-CM | POA: Diagnosis not present

## 2017-12-20 DIAGNOSIS — I5042 Chronic combined systolic (congestive) and diastolic (congestive) heart failure: Secondary | ICD-10-CM

## 2017-12-20 DIAGNOSIS — I5022 Chronic systolic (congestive) heart failure: Secondary | ICD-10-CM | POA: Insufficient documentation

## 2017-12-20 DIAGNOSIS — I2510 Atherosclerotic heart disease of native coronary artery without angina pectoris: Secondary | ICD-10-CM | POA: Diagnosis not present

## 2017-12-20 DIAGNOSIS — E039 Hypothyroidism, unspecified: Secondary | ICD-10-CM | POA: Insufficient documentation

## 2017-12-20 DIAGNOSIS — E119 Type 2 diabetes mellitus without complications: Secondary | ICD-10-CM | POA: Insufficient documentation

## 2017-12-20 DIAGNOSIS — Z7982 Long term (current) use of aspirin: Secondary | ICD-10-CM | POA: Insufficient documentation

## 2017-12-20 DIAGNOSIS — Z6835 Body mass index (BMI) 35.0-35.9, adult: Secondary | ICD-10-CM | POA: Insufficient documentation

## 2017-12-20 DIAGNOSIS — Z853 Personal history of malignant neoplasm of breast: Secondary | ICD-10-CM | POA: Insufficient documentation

## 2017-12-20 DIAGNOSIS — M109 Gout, unspecified: Secondary | ICD-10-CM | POA: Diagnosis not present

## 2017-12-20 DIAGNOSIS — M7989 Other specified soft tissue disorders: Secondary | ICD-10-CM

## 2017-12-20 DIAGNOSIS — Z882 Allergy status to sulfonamides status: Secondary | ICD-10-CM | POA: Insufficient documentation

## 2017-12-20 DIAGNOSIS — I255 Ischemic cardiomyopathy: Secondary | ICD-10-CM | POA: Diagnosis not present

## 2017-12-20 DIAGNOSIS — Z79899 Other long term (current) drug therapy: Secondary | ICD-10-CM | POA: Insufficient documentation

## 2017-12-20 DIAGNOSIS — Z923 Personal history of irradiation: Secondary | ICD-10-CM | POA: Diagnosis not present

## 2017-12-20 DIAGNOSIS — Z79890 Hormone replacement therapy (postmenopausal): Secondary | ICD-10-CM | POA: Diagnosis not present

## 2017-12-20 DIAGNOSIS — M79604 Pain in right leg: Secondary | ICD-10-CM | POA: Insufficient documentation

## 2017-12-20 DIAGNOSIS — I5020 Unspecified systolic (congestive) heart failure: Secondary | ICD-10-CM | POA: Diagnosis present

## 2017-12-20 DIAGNOSIS — Z794 Long term (current) use of insulin: Secondary | ICD-10-CM | POA: Insufficient documentation

## 2017-12-20 DIAGNOSIS — I5030 Unspecified diastolic (congestive) heart failure: Secondary | ICD-10-CM | POA: Insufficient documentation

## 2017-12-20 DIAGNOSIS — Z7902 Long term (current) use of antithrombotics/antiplatelets: Secondary | ICD-10-CM | POA: Insufficient documentation

## 2017-12-20 DIAGNOSIS — Z807 Family history of other malignant neoplasms of lymphoid, hematopoietic and related tissues: Secondary | ICD-10-CM | POA: Insufficient documentation

## 2017-12-20 DIAGNOSIS — Z888 Allergy status to other drugs, medicaments and biological substances status: Secondary | ICD-10-CM | POA: Insufficient documentation

## 2017-12-20 NOTE — Progress Notes (Signed)
Paramedicine Encounter    Patient ID: Danielle [name], female    DOB: 09-Jul-1945, 72 y.o.   MRN: [id number]   Patient Care Team: Seward Carol, MD as PCP - General (Internal Medicine)  Patient Active Problem List   Diagnosis Date Noted  . Obesity (BMI 30-39.9) 11/11/2017  . OSA (obstructive sleep apnea) 02/18/2017  . Acute combined systolic and diastolic congestive heart failure ([place]) 02/05/2017  . Essential hypertension   . Diabetes mellitus without complication ([place])   . Cancer ([place])   . Thyroid disease     Current Outpatient Medications:  .  acetaminophen (TYLENOL) 650 MG CR tablet, Take 650 mg by mouth every 8 (eight) hours as needed for pain., Disp: , Rfl:  .  allopurinol (ZYLOPRIM) 100 MG tablet, Take 1 tablet (100 mg total) by mouth daily., Disp: , Rfl:  .  aspirin 81 MG tablet, Take 1 tablet (81 mg total) by mouth daily., Disp: 30 tablet, Rfl: 3 .  atorvastatin (LIPITOR) 80 MG tablet, Take 1 tablet (80 mg total) by mouth at bedtime., Disp: 30 tablet, Rfl: 11 .  Biotin 10 MG CAPS, Take 10 mg by mouth daily., Disp: , Rfl:  .  carvedilol (COREG) 25 MG tablet, Take 1 tablet (25 mg total) by mouth 2 (two) times daily., Disp: 180 tablet, Rfl: 3 .  ferrous sulfate 325 (65 FE) MG tablet, Take 325 mg by mouth 2 (two) times daily with a meal., Disp: , Rfl:  .  insulin glargine (LANTUS) 100 UNIT/ML injection, Inject 30 Units into the skin 2 (two) times daily. , Disp: , Rfl:  .  insulin lispro (HUMALOG) 100 UNIT/ML injection, Inject 5 Units into the skin daily with lunch. , Disp: , Rfl:  .  isosorbide mononitrate (IMDUR) 30 MG 24 hr tablet, Take 1 tablet (30 mg total) by mouth daily., Disp: 30 tablet, Rfl: 3 .  levothyroxine (SYNTHROID, LEVOTHROID) 75 MCG tablet, Take 75 mcg by mouth daily before breakfast., Disp: , Rfl:  .  losartan (COZAAR) 50 MG tablet, Take 1 tablet (50 mg total) by mouth daily., Disp: 30 tablet, Rfl: 3 .  metFORMIN (GLUCOPHAGE) 500 MG tablet, Take 500 mg by mouth 2  (two) times daily with a meal. , Disp: , Rfl:  .  Multiple Vitamins-Minerals (ALIVE ONCE DAILY WOMENS 50+ PO), Take 1 tablet by mouth daily., Disp: , Rfl:  .  omeprazole (PRILOSEC) 20 MG capsule, Take 20 mg by mouth daily. , Disp: , Rfl:  .  potassium chloride (K-DUR,KLOR-CON) 10 MEQ tablet, Take 2 tablets (20 mEq total) by mouth daily., Disp: 60 tablet, Rfl: 5 .  spironolactone (ALDACTONE) 25 MG tablet, Take 1 tablet (25 mg total) by mouth every other day., Disp: 15 tablet, Rfl: 5 .  ticagrelor (BRILINTA) 90 MG TABS tablet, Take 1 tablet (90 mg total) by mouth 2 (two) times daily., Disp: 60 tablet, Rfl: 5 .  torsemide (DEMADEX) 20 MG tablet, Take 1 tablet (20 mg total) by mouth 2 (two) times daily., Disp: 60 tablet, Rfl: 11 .  Travoprost, BAK Free, (TRAVATAN) 0.004 % SOLN ophthalmic solution, Place 1 drop into both eyes daily., Disp: , Rfl:  .  calcium citrate (CALCITRATE - DOSED IN MG ELEMENTAL CALCIUM) 950 MG tablet, Take 1 tablet by mouth 2 (two) times daily. , Disp: , Rfl:  .  CHONDROITIN SULFATE PO, Take 250 mg by mouth daily., Disp: , Rfl:  .  ibuprofen (ADVIL,MOTRIN) 800 MG tablet, Take 800 mg by mouth every 6 (six) hours  as needed for mild pain (gout/arthritis pain)., Disp: , Rfl:  .  Lactobacillus (ACIDOPHILUS PO), Take 1 capsule by mouth daily., Disp: , Rfl:  .  nitroGLYCERIN (NITROSTAT) 0.4 MG SL tablet, Place 1 tablet (0.4 mg total) under the tongue every 5 (five) minutes as needed for chest pain. (Patient not taking: Reported on 12/20/2017), Disp: 25 tablet, Rfl: 0 Allergies  Allergen Reactions  . Sulfa Antibiotics Rash  . Uloric [Febuxostat] Rash      Social History   Socioeconomic History  . Marital status: Married    Spouse name: Not on file  . Number of children: Not on file  . Years of education: Not on file  . Highest education level: Not on file  Occupational History  . Not on file  Social Needs  . Financial resource strain: Not on file  . Food insecurity:     Worry: Not on file    Inability: Not on file  . Transportation needs:    Medical: Not on file    Non-medical: Not on file  Tobacco Use  . Smoking status: Former Smoker    Packs/day: 1.00    Years: 22.00    Pack years: 22.00    Types: Cigarettes    Last attempt to quit: 1983    Years since quitting: 36.5  . Smokeless tobacco: Never Used  Substance and Sexual Activity  . Alcohol use: Yes    Comment: ocassional  . Drug use: No  . Sexual activity: Never    Birth control/protection: None, Post-menopausal  Lifestyle  . Physical activity:    Days per week: Not on file    Minutes per session: Not on file  . Stress: Not on file  Relationships  . Social connections:    Talks on phone: Not on file    Gets together: Not on file    Attends religious service: Not on file    Active member of club or organization: Not on file    Attends meetings of clubs or organizations: Not on file    Relationship status: Not on file  . Intimate partner violence:    Fear of current or ex partner: Not on file    Emotionally abused: Not on file    Physically abused: Not on file    Forced sexual activity: Not on file  Other Topics Concern  . Not on file  Social History Narrative  . Not on file    Physical Exam  Constitutional: She is oriented to person, place, and time.  Cardiovascular: Normal rate and regular rhythm.  Pulmonary/Chest: Effort normal and breath sounds normal. No respiratory distress.  Abdominal: Soft.  Musculoskeletal: Normal range of motion. She exhibits edema.  Neurological: She is alert and oriented to person, place, and time.  Skin: Skin is warm and dry.  Psychiatric: She has a normal mood and affect.        Future Appointments  Date Time Provider [place]  01/17/2018 10:30 AM Marzetta Board, MD [facility]  02/13/2018 11:40 AM Bensimhon, Shaune Pascal, MD [facility] None  02/18/2018  7:30 AM [facility] DEVICE REMOTES [facility]    BP (!)  100/56 (BP Location: Left Arm, Patient Position: Sitting, Cuff Size: Large)   Pulse 80   Resp 16   Wt 200 lb (90.7 kg)   SpO2 97%   BMI 34.33 kg/m   Weight yesterday- 200 lb Last visit weight- 201 lb  [name] was seen at home today and reported feeling tired  after being at the [place] office today. She has been compliant with her medications and according to her after visit summary from the HF clinic, no medications changes were necessary today. Her medications were verified and her pillbox was refilled. Necessary refills were called into the pharmacy however she does not have a current prescription for Ferrous Sulfate (she has been taking 27 mg OTC iron supplements) so I instructed her to contact her PCP and ask if she needs the prescription or if the OTC's are sufficient. She will be out of town next week so I will plan on seeing her when she returns in two weeks.   [name], EMT 12/20/17  ACTION: Home visit completed Next visit planned for 2 week

## 2017-12-20 NOTE — Telephone Encounter (Signed)
This is a CHF pt 

## 2017-12-20 NOTE — Patient Instructions (Signed)
No changes to medication at this time.  Will schedule you for an echocardiogram at [facility]. Address: [address] ([place]), [place], [place] [address]  Phone: [phone] Their office will call you to schedule.  Follow up 6 weeks with Dr. Haroldine Laws.  ____________________________________________________________________ Danielle [name] Code: [id number]  Take all medication as prescribed the day of your appointment. Bring all medications with you to your appointment.  Do the following things EVERYDAY: 1) Weigh yourself in the morning before breakfast. Write it down and keep it in a log. 2) Take your medicines as prescribed 3) Eat low salt foods-Limit salt (sodium) to 2000 mg per day.  4) Stay as active as you can everyday 5) Limit all fluids for the day to less than 2 liters

## 2017-12-25 DIAGNOSIS — G4733 Obstructive sleep apnea (adult) (pediatric): Secondary | ICD-10-CM | POA: Diagnosis not present

## 2017-12-26 DIAGNOSIS — G4733 Obstructive sleep apnea (adult) (pediatric): Secondary | ICD-10-CM | POA: Diagnosis not present

## 2017-12-30 DIAGNOSIS — I255 Ischemic cardiomyopathy: Secondary | ICD-10-CM | POA: Diagnosis not present

## 2017-12-30 DIAGNOSIS — E039 Hypothyroidism, unspecified: Secondary | ICD-10-CM | POA: Diagnosis not present

## 2017-12-30 DIAGNOSIS — R1031 Right lower quadrant pain: Secondary | ICD-10-CM | POA: Diagnosis not present

## 2017-12-30 DIAGNOSIS — I2510 Atherosclerotic heart disease of native coronary artery without angina pectoris: Secondary | ICD-10-CM | POA: Diagnosis not present

## 2017-12-30 DIAGNOSIS — M546 Pain in thoracic spine: Secondary | ICD-10-CM | POA: Diagnosis not present

## 2017-12-30 DIAGNOSIS — E7800 Pure hypercholesterolemia, unspecified: Secondary | ICD-10-CM | POA: Diagnosis not present

## 2017-12-30 DIAGNOSIS — R109 Unspecified abdominal pain: Secondary | ICD-10-CM | POA: Diagnosis not present

## 2017-12-30 DIAGNOSIS — I10 Essential (primary) hypertension: Secondary | ICD-10-CM | POA: Diagnosis not present

## 2017-12-30 DIAGNOSIS — M62838 Other muscle spasm: Secondary | ICD-10-CM | POA: Diagnosis not present

## 2017-12-30 DIAGNOSIS — E119 Type 2 diabetes mellitus without complications: Secondary | ICD-10-CM | POA: Diagnosis not present

## 2017-12-30 DIAGNOSIS — I130 Hypertensive heart and chronic kidney disease with heart failure and stage 1 through stage 4 chronic kidney disease, or unspecified chronic kidney disease: Secondary | ICD-10-CM | POA: Diagnosis not present

## 2017-12-30 DIAGNOSIS — M47816 Spondylosis without myelopathy or radiculopathy, lumbar region: Secondary | ICD-10-CM | POA: Diagnosis not present

## 2017-12-30 DIAGNOSIS — I509 Heart failure, unspecified: Secondary | ICD-10-CM | POA: Diagnosis not present

## 2017-12-30 DIAGNOSIS — I5022 Chronic systolic (congestive) heart failure: Secondary | ICD-10-CM | POA: Diagnosis not present

## 2017-12-30 DIAGNOSIS — C50011 Malignant neoplasm of nipple and areola, right female breast: Secondary | ICD-10-CM | POA: Diagnosis not present

## 2017-12-30 DIAGNOSIS — I5020 Unspecified systolic (congestive) heart failure: Secondary | ICD-10-CM | POA: Diagnosis not present

## 2017-12-30 DIAGNOSIS — G4733 Obstructive sleep apnea (adult) (pediatric): Secondary | ICD-10-CM | POA: Diagnosis not present

## 2017-12-30 DIAGNOSIS — M1990 Unspecified osteoarthritis, unspecified site: Secondary | ICD-10-CM | POA: Diagnosis not present

## 2017-12-30 DIAGNOSIS — E1122 Type 2 diabetes mellitus with diabetic chronic kidney disease: Secondary | ICD-10-CM | POA: Diagnosis not present

## 2017-12-30 DIAGNOSIS — N183 Chronic kidney disease, stage 3 (moderate): Secondary | ICD-10-CM | POA: Diagnosis not present

## 2017-12-31 DIAGNOSIS — R1031 Right lower quadrant pain: Secondary | ICD-10-CM | POA: Diagnosis not present

## 2017-12-31 DIAGNOSIS — E119 Type 2 diabetes mellitus without complications: Secondary | ICD-10-CM | POA: Diagnosis not present

## 2017-12-31 DIAGNOSIS — I10 Essential (primary) hypertension: Secondary | ICD-10-CM | POA: Diagnosis not present

## 2017-12-31 DIAGNOSIS — I5022 Chronic systolic (congestive) heart failure: Secondary | ICD-10-CM | POA: Diagnosis not present

## 2018-01-03 ENCOUNTER — Other Ambulatory Visit (HOSPITAL_COMMUNITY): Payer: Self-pay

## 2018-01-03 NOTE — Progress Notes (Signed)
Paramedicine Encounter    Patient ID: Danielle [name], female    DOB: 06-12-1945, 72 y.o.   MRN: [id number]   Patient Care Team: Seward Carol, MD as PCP - General (Internal Medicine)  Patient Active Problem List   Diagnosis Date Noted  . (HFpEF) heart failure with preserved ejection fraction ([place]) 12/20/2017  . Obesity (BMI 30-39.9) 11/11/2017  . OSA (obstructive sleep apnea) 02/18/2017  . Essential hypertension   . Diabetes mellitus without complication ([place])   . Cancer ([place])   . Thyroid disease     Current Outpatient Medications:  .  acetaminophen (TYLENOL) 650 MG CR tablet, Take 650 mg by mouth every 8 (eight) hours as needed for pain., Disp: , Rfl:  .  allopurinol (ZYLOPRIM) 100 MG tablet, Take 1 tablet (100 mg total) by mouth daily., Disp: , Rfl:  .  aspirin 81 MG tablet, Take 1 tablet (81 mg total) by mouth daily., Disp: 30 tablet, Rfl: 3 .  atorvastatin (LIPITOR) 80 MG tablet, Take 1 tablet (80 mg total) by mouth at bedtime., Disp: 30 tablet, Rfl: 11 .  Biotin 10 MG CAPS, Take 10 mg by mouth daily., Disp: , Rfl:  .  carvedilol (COREG) 25 MG tablet, Take 1 tablet (25 mg total) by mouth 2 (two) times daily., Disp: 180 tablet, Rfl: 3 .  ferrous sulfate 325 (65 FE) MG tablet, Take 325 mg by mouth 2 (two) times daily with a meal., Disp: , Rfl:  .  ibuprofen (ADVIL,MOTRIN) 800 MG tablet, Take 800 mg by mouth every 6 (six) hours as needed for mild pain (gout/arthritis pain)., Disp: , Rfl:  .  insulin glargine (LANTUS) 100 UNIT/ML injection, Inject 30 Units into the skin 2 (two) times daily. , Disp: , Rfl:  .  insulin lispro (HUMALOG) 100 UNIT/ML injection, Inject 5 Units into the skin daily with lunch. , Disp: , Rfl:  .  isosorbide mononitrate (IMDUR) 30 MG 24 hr tablet, Take 1 tablet (30 mg total) by mouth daily., Disp: 30 tablet, Rfl: 3 .  isosorbide mononitrate (IMDUR) 30 MG 24 hr tablet, TAKE 1 TABLET BY MOUTH ONCE DAILY, Disp: 30 tablet, Rfl: 11 .  Lactobacillus (ACIDOPHILUS PO),  Take 1 capsule by mouth daily., Disp: , Rfl:  .  levothyroxine (SYNTHROID, LEVOTHROID) 75 MCG tablet, Take 75 mcg by mouth daily before breakfast., Disp: , Rfl:  .  losartan (COZAAR) 50 MG tablet, Take 1 tablet (50 mg total) by mouth daily., Disp: 30 tablet, Rfl: 3 .  metFORMIN (GLUCOPHAGE) 500 MG tablet, Take 500 mg by mouth 2 (two) times daily with a meal. , Disp: , Rfl:  .  Multiple Vitamins-Minerals (ALIVE ONCE DAILY WOMENS 50+ PO), Take 1 tablet by mouth daily., Disp: , Rfl:  .  omeprazole (PRILOSEC) 20 MG capsule, Take 20 mg by mouth daily. , Disp: , Rfl:  .  potassium chloride (K-DUR,KLOR-CON) 10 MEQ tablet, Take 2 tablets (20 mEq total) by mouth daily., Disp: 60 tablet, Rfl: 5 .  spironolactone (ALDACTONE) 25 MG tablet, Take 1 tablet (25 mg total) by mouth every other day., Disp: 15 tablet, Rfl: 5 .  ticagrelor (BRILINTA) 90 MG TABS tablet, Take 1 tablet (90 mg total) by mouth 2 (two) times daily., Disp: 60 tablet, Rfl: 5 .  torsemide (DEMADEX) 20 MG tablet, Take 1 tablet (20 mg total) by mouth 2 (two) times daily., Disp: 60 tablet, Rfl: 11 .  Travoprost, BAK Free, (TRAVATAN) 0.004 % SOLN ophthalmic solution, Place 1 drop into both eyes daily.,  Disp: , Rfl:  .  calcium citrate (CALCITRATE - DOSED IN MG ELEMENTAL CALCIUM) 950 MG tablet, Take 1 tablet by mouth 2 (two) times daily. , Disp: , Rfl:  .  CHONDROITIN SULFATE PO, Take 250 mg by mouth daily., Disp: , Rfl:  .  nitroGLYCERIN (NITROSTAT) 0.4 MG SL tablet, Place 1 tablet (0.4 mg total) under the tongue every 5 (five) minutes as needed for chest pain. (Patient not taking: Reported on 12/20/2017), Disp: 25 tablet, Rfl: 0 Allergies  Allergen Reactions  . Sulfa Antibiotics Rash  . Uloric [Febuxostat] Rash      Social History   Socioeconomic History  . Marital status: Married    Spouse name: Not on file  . Number of children: Not on file  . Years of education: Not on file  . Highest education level: Not on file  Occupational  History  . Not on file  Social Needs  . Financial resource strain: Not on file  . Food insecurity:    Worry: Not on file    Inability: Not on file  . Transportation needs:    Medical: Not on file    Non-medical: Not on file  Tobacco Use  . Smoking status: Former Smoker    Packs/day: 1.00    Years: 22.00    Pack years: 22.00    Types: Cigarettes    Last attempt to quit: 1983    Years since quitting: 36.6  . Smokeless tobacco: Never Used  Substance and Sexual Activity  . Alcohol use: Yes    Comment: ocassional  . Drug use: No  . Sexual activity: Never    Birth control/protection: None, Post-menopausal  Lifestyle  . Physical activity:    Days per week: Not on file    Minutes per session: Not on file  . Stress: Not on file  Relationships  . Social connections:    Talks on phone: Not on file    Gets together: Not on file    Attends religious service: Not on file    Active member of club or organization: Not on file    Attends meetings of clubs or organizations: Not on file    Relationship status: Not on file  . Intimate partner violence:    Fear of current or ex partner: Not on file    Emotionally abused: Not on file    Physically abused: Not on file    Forced sexual activity: Not on file  Other Topics Concern  . Not on file  Social History Narrative  . Not on file    Physical Exam  Constitutional: She is oriented to person, place, and time.  Cardiovascular: Normal rate and regular rhythm.  Pulmonary/Chest: Effort normal and breath sounds normal.  Abdominal: Soft.  Musculoskeletal: Normal range of motion. She exhibits edema.  Neurological: She is alert and oriented to person, place, and time.  Skin: Skin is warm and dry.  Psychiatric: She has a normal mood and affect.        Future Appointments  Date Time Provider [place]  01/09/2018 11:30 AM [facility] ECHO 3 [id number]  01/17/2018 10:30 AM Marzetta Board, MD [facility]  [facility]  02/13/2018 11:40 AM Bensimhon, Shaune Pascal, MD [facility] None  02/18/2018  7:30 AM [facility] DEVICE REMOTES [facility]    BP (!) 94/58 (BP Location: Right Arm, Patient Position: Sitting, Cuff Size: Large)   Pulse 76   Resp 16   Wt 205 lb (93 kg)   SpO2 97%  BMI 35.19 kg/m   Weight yesterday- 210 lb Last visit weight- 200 lb   Ms Chouinard was seen at home today and reported feeling generally well. She denied SOB, headache, dizziness or orthopnea. She was recently seen in the ED at [facility] for right flank pain and was given Baclofen, tramadol and a course of cephalexin for a possible UTI. He discharge paperwork from [facility] instructed that she stop taking metformin, though she was going to continue until she spoke with her PCP about it. She has been complaint with her medications and has also started taking fish oil capsules. Her medications were verified and her pillbox was refilled.   [name], EMT 01/03/18  ACTION: Home visit completed Next visit planned for 1 week

## 2018-01-08 DIAGNOSIS — R109 Unspecified abdominal pain: Secondary | ICD-10-CM | POA: Diagnosis not present

## 2018-01-09 ENCOUNTER — Other Ambulatory Visit: Payer: Self-pay

## 2018-01-09 ENCOUNTER — Ambulatory Visit (HOSPITAL_COMMUNITY): Payer: Medicare HMO | Attending: Cardiology

## 2018-01-09 DIAGNOSIS — I110 Hypertensive heart disease with heart failure: Secondary | ICD-10-CM | POA: Insufficient documentation

## 2018-01-09 DIAGNOSIS — I5042 Chronic combined systolic (congestive) and diastolic (congestive) heart failure: Secondary | ICD-10-CM | POA: Diagnosis not present

## 2018-01-09 DIAGNOSIS — E119 Type 2 diabetes mellitus without complications: Secondary | ICD-10-CM | POA: Diagnosis not present

## 2018-01-10 ENCOUNTER — Telehealth (HOSPITAL_COMMUNITY): Payer: Self-pay

## 2018-01-10 ENCOUNTER — Other Ambulatory Visit (HOSPITAL_COMMUNITY): Payer: Self-pay

## 2018-01-10 ENCOUNTER — Encounter (HOSPITAL_COMMUNITY): Payer: Self-pay

## 2018-01-10 NOTE — Telephone Encounter (Signed)
Pt called to confirm this am CHP visit. Pt stated that she was home and agreed to visit

## 2018-01-10 NOTE — Telephone Encounter (Signed)
I called to confirm pt's chp visit; pill box need to be refilled.

## 2018-01-10 NOTE — Progress Notes (Signed)
Paramedicine Encounter    Patient ID: Danielle [name], female    DOB: 1945-07-29, 72 y.o.   MRN: [id number]    Patient Care Team: Seward Carol, MD as PCP - General (Internal Medicine)  Patient Active Problem List   Diagnosis Date Noted  . (HFpEF) heart failure with preserved ejection fraction ([place]) 12/20/2017  . Obesity (BMI 30-39.9) 11/11/2017  . OSA (obstructive sleep apnea) 02/18/2017  . Essential hypertension   . Diabetes mellitus without complication ([place])   . Cancer ([place])   . Thyroid disease     Current Outpatient Medications:  .  allopurinol (ZYLOPRIM) 100 MG tablet, Take 1 tablet (100 mg total) by mouth daily., Disp: , Rfl:  .  aspirin 81 MG tablet, Take 1 tablet (81 mg total) by mouth daily., Disp: 30 tablet, Rfl: 3 .  atorvastatin (LIPITOR) 80 MG tablet, Take 1 tablet (80 mg total) by mouth at bedtime., Disp: 30 tablet, Rfl: 11 .  Biotin 10 MG CAPS, Take 10 mg by mouth daily., Disp: , Rfl:  .  ferrous sulfate 325 (65 FE) MG tablet, Take 325 mg by mouth 2 (two) times daily with a meal., Disp: , Rfl:  .  isosorbide mononitrate (IMDUR) 30 MG 24 hr tablet, Take 1 tablet (30 mg total) by mouth daily., Disp: 30 tablet, Rfl: 3 .  levothyroxine (SYNTHROID, LEVOTHROID) 75 MCG tablet, Take 75 mcg by mouth daily before breakfast., Disp: , Rfl:  .  losartan (COZAAR) 50 MG tablet, Take 1 tablet (50 mg total) by mouth daily., Disp: 30 tablet, Rfl: 3 .  metFORMIN (GLUCOPHAGE) 500 MG tablet, Take 500 mg by mouth 2 (two) times daily with a meal. , Disp: , Rfl:  .  Multiple Vitamins-Minerals (ALIVE ONCE DAILY WOMENS 50+ PO), Take 1 tablet by mouth daily., Disp: , Rfl:  .  omeprazole (PRILOSEC) 20 MG capsule, Take 20 mg by mouth daily. , Disp: , Rfl:  .  potassium chloride (K-DUR,KLOR-CON) 10 MEQ tablet, Take 2 tablets (20 mEq total) by mouth daily., Disp: 60 tablet, Rfl: 5 .  spironolactone (ALDACTONE) 25 MG tablet, Take 1 tablet (25 mg total) by mouth every other day., Disp: 15 tablet, Rfl:  5 .  torsemide (DEMADEX) 20 MG tablet, Take 1 tablet (20 mg total) by mouth 2 (two) times daily., Disp: 60 tablet, Rfl: 11 .  acetaminophen (TYLENOL) 650 MG CR tablet, Take 650 mg by mouth every 8 (eight) hours as needed for pain., Disp: , Rfl:  .  calcium citrate (CALCITRATE - DOSED IN MG ELEMENTAL CALCIUM) 950 MG tablet, Take 1 tablet by mouth 2 (two) times daily. , Disp: , Rfl:  .  carvedilol (COREG) 25 MG tablet, Take 1 tablet (25 mg total) by mouth 2 (two) times daily., Disp: 180 tablet, Rfl: 3 .  CHONDROITIN SULFATE PO, Take 250 mg by mouth daily., Disp: , Rfl:  .  ibuprofen (ADVIL,MOTRIN) 800 MG tablet, Take 800 mg by mouth every 6 (six) hours as needed for mild pain (gout/arthritis pain)., Disp: , Rfl:  .  insulin glargine (LANTUS) 100 UNIT/ML injection, Inject 30 Units into the skin 2 (two) times daily. , Disp: , Rfl:  .  insulin lispro (HUMALOG) 100 UNIT/ML injection, Inject 5 Units into the skin daily with lunch. , Disp: , Rfl:  .  isosorbide mononitrate (IMDUR) 30 MG 24 hr tablet, TAKE 1 TABLET BY MOUTH ONCE DAILY, Disp: 30 tablet, Rfl: 11 .  Lactobacillus (ACIDOPHILUS PO), Take 1 capsule by mouth daily., Disp: , Rfl:  .  nitroGLYCERIN (NITROSTAT) 0.4 MG SL tablet, Place 1 tablet (0.4 mg total) under the tongue every 5 (five) minutes as needed for chest pain. (Patient not taking: Reported on 12/20/2017), Disp: 25 tablet, Rfl: 0 .  ticagrelor (BRILINTA) 90 MG TABS tablet, Take 1 tablet (90 mg total) by mouth 2 (two) times daily., Disp: 60 tablet, Rfl: 5 .  Travoprost, BAK Free, (TRAVATAN) 0.004 % SOLN ophthalmic solution, Place 1 drop into both eyes daily., Disp: , Rfl:  Allergies  Allergen Reactions  . Sulfa Antibiotics Rash  . Uloric [Febuxostat] Rash     Social History   Socioeconomic History  . Marital status: Married    Spouse name: Not on file  . Number of children: Not on file  . Years of education: Not on file  . Highest education level: Not on file  Occupational  History  . Not on file  Social Needs  . Financial resource strain: Not on file  . Food insecurity:    Worry: Not on file    Inability: Not on file  . Transportation needs:    Medical: Not on file    Non-medical: Not on file  Tobacco Use  . Smoking status: Former Smoker    Packs/day: 1.00    Years: 22.00    Pack years: 22.00    Types: Cigarettes    Last attempt to quit: 1983    Years since quitting: 36.6  . Smokeless tobacco: Never Used  Substance and Sexual Activity  . Alcohol use: Yes    Comment: ocassional  . Drug use: No  . Sexual activity: Never    Birth control/protection: None, Post-menopausal  Lifestyle  . Physical activity:    Days per week: Not on file    Minutes per session: Not on file  . Stress: Not on file  Relationships  . Social connections:    Talks on phone: Not on file    Gets together: Not on file    Attends religious service: Not on file    Active member of club or organization: Not on file    Attends meetings of clubs or organizations: Not on file    Relationship status: Not on file  . Intimate partner violence:    Fear of current or ex partner: Not on file    Emotionally abused: Not on file    Physically abused: Not on file    Forced sexual activity: Not on file  Other Topics Concern  . Not on file  Social History Narrative  . Not on file    Physical Exam  Pulmonary/Chest: Effort normal. No respiratory distress.  Abdominal: Soft.  Musculoskeletal: She exhibits edema.  Edema noted to both feet, ankles, lower leg +2-+3 about 3 inches above ankles  Skin: Skin is warm and dry.        Future Appointments  Date Time Provider [place]  01/17/2018 10:30 AM Marzetta Board, MD [facility]  02/13/2018 11:40 AM Bensimhon, Shaune Pascal, MD [facility] None  02/18/2018  7:30 AM [facility]OT[facility]     BP 116/64 (BP Location: Left Arm, Patient Position: Sitting, Cuff Size: Large)   Pulse 77    Resp 16   Wt 202 lb (91.6 kg)   SpO2 97%   BMI 34.67 kg/m    ATF pt CAO x4 sitting at the kitchen table eating.  Pt has taken all of her meds with no issues. Pt denies sob, chest pain and dizziness. Pt stated that her hands  are hurting due to arthritis.  Pt stated that she's still taking Mobic, therefore it was placed in the pill box.  She has carvedilol in 12.5 tabs, she was advised that there will be 2 tabs in place of the 25 mg tabs.  Angela at the heart failure clinic confirmed that pt is supposed to taking carvedilol 25 mg BID.  Pt stated that she is up urinating until about 2am, therefore I placed her second does of torsemide in the afternoon slot. Pt made aware of the same.  Spirolactone is placed in tue, thurs and sat; she stated that she took it yesterday.  Pt stated that she spoke with her pcp about the antibiotic she was prescribed and he stated that he's unsure why she's taking it.  He was going to have someone call her back if she need to continue taking them.  The antibiotic was left out of her pill box until she's sure that she need to take it.   Medication ordered:  Prachi Oftedahl, EMT Paramedic [phone] 01/10/2018    ACTION: Home visit completed

## 2018-01-12 ENCOUNTER — Other Ambulatory Visit: Payer: Self-pay | Admitting: Cardiology

## 2018-01-13 ENCOUNTER — Other Ambulatory Visit: Payer: Self-pay | Admitting: Student

## 2018-01-17 ENCOUNTER — Ambulatory Visit: Payer: Self-pay | Admitting: Podiatry

## 2018-01-17 ENCOUNTER — Telehealth (HOSPITAL_COMMUNITY): Payer: Self-pay

## 2018-01-17 NOTE — Telephone Encounter (Signed)
I called Danielle [name] to schedule an appointment. She advised that she had suffered a death in her family and was on the way to [place]. She said she would be in touch when she gets home.

## 2018-01-24 ENCOUNTER — Telehealth (HOSPITAL_COMMUNITY): Payer: Self-pay

## 2018-01-24 ENCOUNTER — Other Ambulatory Visit (HOSPITAL_COMMUNITY): Payer: Self-pay

## 2018-01-24 DIAGNOSIS — E2839 Other primary ovarian failure: Secondary | ICD-10-CM | POA: Diagnosis not present

## 2018-01-24 MED ORDER — CARVEDILOL 25 MG PO TABS: 25.0000 mg | ORAL_TABLET | Freq: Two times a day (BID) | ORAL | 3 refills | Status: DC

## 2018-01-24 MED ORDER — ISOSORBIDE MONONITRATE ER 30 MG PO TB24: 30.0000 mg | ORAL_TABLET | Freq: Every day | ORAL | 10 refills | Status: DC

## 2018-01-24 MED ORDER — POTASSIUM CHLORIDE CRYS ER 10 MEQ PO TBCR: 20.0000 meq | EXTENDED_RELEASE_TABLET | Freq: Every day | ORAL | 5 refills | Status: DC

## 2018-01-24 NOTE — Telephone Encounter (Signed)
Danielle [name] called to let me know she was home from her bone density study and I could com by when I was able. I told her I would be there by 15:00.

## 2018-01-24 NOTE — Progress Notes (Signed)
Paramedicine Encounter    Patient ID: Danielle [name], female    DOB: [birth year], 72 y.o.   MRN: [id number]   Patient Care Team: Seward Carol, MD as PCP - General (Internal Medicine)  Patient Active Problem List   Diagnosis Date Noted  . (HFpEF) heart failure with preserved ejection fraction ([place]) 12/20/2017  . Obesity (BMI 30-39.9) 11/11/2017  . OSA (obstructive sleep apnea) 02/18/2017  . Essential hypertension   . Diabetes mellitus without complication ([place])   . Cancer ([place])   . Thyroid disease     Current Outpatient Medications:  .  acetaminophen (TYLENOL) 650 MG CR tablet, Take 650 mg by mouth every 8 (eight) hours as needed for pain., Disp: , Rfl:  .  allopurinol (ZYLOPRIM) 100 MG tablet, Take 1 tablet (100 mg total) by mouth daily., Disp: , Rfl:  .  aspirin 81 MG tablet, Take 1 tablet (81 mg total) by mouth daily., Disp: 30 tablet, Rfl: 3 .  atorvastatin (LIPITOR) 80 MG tablet, Take 1 tablet (80 mg total) by mouth at bedtime., Disp: 30 tablet, Rfl: 11 .  Biotin 10 MG CAPS, Take 10 mg by mouth daily., Disp: , Rfl:  .  ferrous sulfate 325 (65 FE) MG tablet, Take 325 mg by mouth 2 (two) times daily with a meal., Disp: , Rfl:  .  insulin glargine (LANTUS) 100 UNIT/ML injection, Inject 30 Units into the skin 2 (two) times daily. , Disp: , Rfl:  .  insulin lispro (HUMALOG) 100 UNIT/ML injection, Inject 5 Units into the skin daily with lunch. , Disp: , Rfl:  .  isosorbide mononitrate (IMDUR) 30 MG 24 hr tablet, Take 1 tablet (30 mg total) by mouth daily., Disp: 30 tablet, Rfl: 3 .  Lactobacillus (ACIDOPHILUS PO), Take 1 capsule by mouth daily., Disp: , Rfl:  .  levothyroxine (SYNTHROID, LEVOTHROID) 75 MCG tablet, Take 75 mcg by mouth daily before breakfast., Disp: , Rfl:  .  losartan (COZAAR) 50 MG tablet, Take 1 tablet (50 mg total) by mouth daily., Disp: 30 tablet, Rfl: 3 .  metFORMIN (GLUCOPHAGE) 500 MG tablet, Take 500 mg by mouth 2 (two) times daily with a meal. , Disp: , Rfl:  .   Multiple Vitamins-Minerals (ALIVE ONCE DAILY WOMENS 50+ PO), Take 1 tablet by mouth daily., Disp: , Rfl:  .  omeprazole (PRILOSEC) 20 MG capsule, Take 20 mg by mouth daily. , Disp: , Rfl:  .  spironolactone (ALDACTONE) 25 MG tablet, Take 1 tablet (25 mg total) by mouth every other day., Disp: 15 tablet, Rfl: 5 .  ticagrelor (BRILINTA) 90 MG TABS tablet, Take 1 tablet (90 mg total) by mouth 2 (two) times daily., Disp: 60 tablet, Rfl: 5 .  torsemide (DEMADEX) 20 MG tablet, Take 1 tablet (20 mg total) by mouth 2 (two) times daily., Disp: 60 tablet, Rfl: 11 .  Travoprost, BAK Free, (TRAVATAN) 0.004 % SOLN ophthalmic solution, Place 1 drop into both eyes daily., Disp: , Rfl:  .  calcium citrate (CALCITRATE - DOSED IN MG ELEMENTAL CALCIUM) 950 MG tablet, Take 1 tablet by mouth 2 (two) times daily. , Disp: , Rfl:  .  carvedilol (COREG) 25 MG tablet, Take 1 tablet (25 mg total) by mouth 2 (two) times daily., Disp: 180 tablet, Rfl: 3 .  CHONDROITIN SULFATE PO, Take 250 mg by mouth daily., Disp: , Rfl:  .  ibuprofen (ADVIL,MOTRIN) 800 MG tablet, Take 800 mg by mouth every 6 (six) hours as needed for mild pain (gout/arthritis pain)., Disp: ,  Rfl:  .  isosorbide mononitrate (IMDUR) 30 MG 24 hr tablet, Take 1 tablet (30 mg total) by mouth daily., Disp: 30 tablet, Rfl: 10 .  nitroGLYCERIN (NITROSTAT) 0.4 MG SL tablet, DISSOLVE ONE TABLET UNDER THE TONGUE EVERY 5 MINUTES AS NEEDED FOR CHEST PAIN. (Patient not taking: Reported on 01/24/2018), Disp: 25 tablet, Rfl: 3 .  potassium chloride (K-DUR,KLOR-CON) 10 MEQ tablet, Take 2 tablets (20 mEq total) by mouth daily., Disp: 60 tablet, Rfl: 5 Allergies  Allergen Reactions  . Sulfa Antibiotics Rash  . Uloric [Febuxostat] Rash      Social History   Socioeconomic History  . Marital status: Married    Spouse name: Not on file  . Number of children: Not on file  . Years of education: Not on file  . Highest education level: Not on file  Occupational History  .  Not on file  Social Needs  . Financial resource strain: Not on file  . Food insecurity:    Worry: Not on file    Inability: Not on file  . Transportation needs:    Medical: Not on file    Non-medical: Not on file  Tobacco Use  . Smoking status: Former Smoker    Packs/day: 1.00    Years: 22.00    Pack years: 22.00    Types: Cigarettes    Last attempt to quit: 1983    Years since quitting: 36.6  . Smokeless tobacco: Never Used  Substance and Sexual Activity  . Alcohol use: Yes    Comment: ocassional  . Drug use: No  . Sexual activity: Never    Birth control/protection: None, Post-menopausal  Lifestyle  . Physical activity:    Days per week: Not on file    Minutes per session: Not on file  . Stress: Not on file  Relationships  . Social connections:    Talks on phone: Not on file    Gets together: Not on file    Attends religious service: Not on file    Active member of club or organization: Not on file    Attends meetings of clubs or organizations: Not on file    Relationship status: Not on file  . Intimate partner violence:    Fear of current or ex partner: Not on file    Emotionally abused: Not on file    Physically abused: Not on file    Forced sexual activity: Not on file  Other Topics Concern  . Not on file  Social History Narrative  . Not on file    Physical Exam  Cardiovascular: Normal rate and regular rhythm.  Pulmonary/Chest: Effort normal and breath sounds normal.  Abdominal: Soft.  Musculoskeletal: Normal range of motion. She exhibits edema.  Neurological: She is alert.  Skin: Skin is warm and dry.  Psychiatric: She has a normal mood and affect.        Future Appointments  Date Time Provider [place]  02/13/2018 11:40 AM Bensimhon, Shaune Pascal, MD [facility] None  02/18/2018  7:30 AM [facility]  02/28/2018  8:30 AM Marzetta Board, MD [facility]    BP 116/70 (BP Location: Left Arm,  Patient Position: Sitting, Cuff Size: Large)   Pulse 80   Resp 16   Wt 201 lb (91.2 kg)   SpO2 98%   BMI 34.50 kg/m   Weight yesterday- 200.3 lb Last visit weight- 202 lb  Ms Bungert was seen at home today and reported feeling generally well. She  denied SOB, headache, dizziness or orthopnea. Her only complaint today was fatigue due to being in [place] for the past week due to another death in her family. Her medications were verified and her pillbox was refilled. She needed refills on carvedilol, metformin and potassium, all of which were ordered through the HF clinic. She is also now taking Celebrex 200 MG nightly due to chronic pain. I advised the clinic of this so her medication list would reflect it. She was left with written instructions on where to put her metformin and carvedilol once she picks it up from the pharmacy. Potassium is not needed in this week.   [name], EMT 01/24/18  ACTION: Home visit completed Next visit planned for 1 week

## 2018-01-24 NOTE — Telephone Encounter (Signed)
I called Mr Duer to schedule an appointment. She stated she was on the way to a bone density test and would be home this afternoon. I asked that she call me when she finishes her test and let me know how she is feeling to determine if I will go out this afternoon.

## 2018-01-26 DIAGNOSIS — G4733 Obstructive sleep apnea (adult) (pediatric): Secondary | ICD-10-CM | POA: Diagnosis not present

## 2018-01-28 DIAGNOSIS — R69 Illness, unspecified: Secondary | ICD-10-CM | POA: Diagnosis not present

## 2018-01-30 ENCOUNTER — Telehealth (HOSPITAL_COMMUNITY): Payer: Self-pay

## 2018-01-30 ENCOUNTER — Other Ambulatory Visit (HOSPITAL_COMMUNITY): Payer: Self-pay

## 2018-01-30 NOTE — Telephone Encounter (Signed)
I called Danielle [name] to schedule an appointment. She stated she was available all day so I could come whenever I was free. I advised that I would be coming over immediately.

## 2018-01-30 NOTE — Progress Notes (Signed)
Paramedicine Encounter    Patient ID: Danielle [name], female    DOB: [birth year], 72 y.o.   MRN: [id number]   Patient Care Team: Seward Carol, MD as PCP - General (Internal Medicine)  Patient Active Problem List   Diagnosis Date Noted  . (HFpEF) heart failure with preserved ejection fraction ([place]) 12/20/2017  . Obesity (BMI 30-39.9) 11/11/2017  . OSA (obstructive sleep apnea) 02/18/2017  . Essential hypertension   . Diabetes mellitus without complication ([place])   . Cancer ([place])   . Thyroid disease     Current Outpatient Medications:  .  acetaminophen (TYLENOL) 650 MG CR tablet, Take 650 mg by mouth every 8 (eight) hours as needed for pain., Disp: , Rfl:  .  allopurinol (ZYLOPRIM) 100 MG tablet, Take 1 tablet (100 mg total) by mouth daily., Disp: , Rfl:  .  aspirin 81 MG tablet, Take 1 tablet (81 mg total) by mouth daily., Disp: 30 tablet, Rfl: 3 .  atorvastatin (LIPITOR) 80 MG tablet, Take 1 tablet (80 mg total) by mouth at bedtime., Disp: 30 tablet, Rfl: 11 .  Biotin 10 MG CAPS, Take 10 mg by mouth daily., Disp: , Rfl:  .  carvedilol (COREG) 25 MG tablet, Take 1 tablet (25 mg total) by mouth 2 (two) times daily., Disp: 180 tablet, Rfl: 3 .  celecoxib (CELEBREX) 200 MG capsule, Take 200 mg by mouth at bedtime. 1 (200 mg) daily before bed, Disp: , Rfl:  .  CHONDROITIN SULFATE PO, Take 250 mg by mouth daily., Disp: , Rfl:  .  ferrous sulfate 325 (65 FE) MG tablet, Take 325 mg by mouth 2 (two) times daily with a meal., Disp: , Rfl:  .  insulin glargine (LANTUS) 100 UNIT/ML injection, Inject 30 Units into the skin 2 (two) times daily. , Disp: , Rfl:  .  insulin lispro (HUMALOG) 100 UNIT/ML injection, Inject 5 Units into the skin daily with lunch. , Disp: , Rfl:  .  isosorbide mononitrate (IMDUR) 30 MG 24 hr tablet, Take 1 tablet (30 mg total) by mouth daily., Disp: 30 tablet, Rfl: 3 .  Lactobacillus (ACIDOPHILUS PO), Take 1 capsule by mouth daily., Disp: , Rfl:  .  levothyroxine  (SYNTHROID, LEVOTHROID) 75 MCG tablet, Take 75 mcg by mouth daily before breakfast., Disp: , Rfl:  .  losartan (COZAAR) 50 MG tablet, Take 1 tablet (50 mg total) by mouth daily., Disp: 30 tablet, Rfl: 3 .  metFORMIN (GLUCOPHAGE) 500 MG tablet, Take 500 mg by mouth 2 (two) times daily with a meal. , Disp: , Rfl:  .  Multiple Vitamins-Minerals (ALIVE ONCE DAILY WOMENS 50+ PO), Take 1 tablet by mouth daily., Disp: , Rfl:  .  omeprazole (PRILOSEC) 20 MG capsule, Take 20 mg by mouth daily. , Disp: , Rfl:  .  potassium chloride (K-DUR,KLOR-CON) 10 MEQ tablet, Take 2 tablets (20 mEq total) by mouth daily., Disp: 60 tablet, Rfl: 5 .  spironolactone (ALDACTONE) 25 MG tablet, Take 1 tablet (25 mg total) by mouth every other day., Disp: 15 tablet, Rfl: 5 .  ticagrelor (BRILINTA) 90 MG TABS tablet, Take 1 tablet (90 mg total) by mouth 2 (two) times daily., Disp: 60 tablet, Rfl: 5 .  torsemide (DEMADEX) 20 MG tablet, Take 1 tablet (20 mg total) by mouth 2 (two) times daily., Disp: 60 tablet, Rfl: 11 .  Travoprost, BAK Free, (TRAVATAN) 0.004 % SOLN ophthalmic solution, Place 1 drop into both eyes daily., Disp: , Rfl:  .  calcium citrate (CALCITRATE -  DOSED IN MG ELEMENTAL CALCIUM) 950 MG tablet, Take 1 tablet by mouth 2 (two) times daily. , Disp: , Rfl:  .  ibuprofen (ADVIL,MOTRIN) 800 MG tablet, Take 800 mg by mouth every 6 (six) hours as needed for mild pain (gout/arthritis pain)., Disp: , Rfl:  .  isosorbide mononitrate (IMDUR) 30 MG 24 hr tablet, Take 1 tablet (30 mg total) by mouth daily., Disp: 30 tablet, Rfl: 10 .  nitroGLYCERIN (NITROSTAT) 0.4 MG SL tablet, DISSOLVE ONE TABLET UNDER THE TONGUE EVERY 5 MINUTES AS NEEDED FOR CHEST PAIN. (Patient not taking: Reported on 01/24/2018), Disp: 25 tablet, Rfl: 3 Allergies  Allergen Reactions  . Sulfa Antibiotics Rash  . Uloric [Febuxostat] Rash      Social History   Socioeconomic History  . Marital status: Married    Spouse name: Not on file  . Number of  children: Not on file  . Years of education: Not on file  . Highest education level: Not on file  Occupational History  . Not on file  Social Needs  . Financial resource strain: Not on file  . Food insecurity:    Worry: Not on file    Inability: Not on file  . Transportation needs:    Medical: Not on file    Non-medical: Not on file  Tobacco Use  . Smoking status: Former Smoker    Packs/day: 1.00    Years: 22.00    Pack years: 22.00    Types: Cigarettes    Last attempt to quit: 1983    Years since quitting: 36.7  . Smokeless tobacco: Never Used  Substance and Sexual Activity  . Alcohol use: Yes    Comment: ocassional  . Drug use: No  . Sexual activity: Never    Birth control/protection: None, Post-menopausal  Lifestyle  . Physical activity:    Days per week: Not on file    Minutes per session: Not on file  . Stress: Not on file  Relationships  . Social connections:    Talks on phone: Not on file    Gets together: Not on file    Attends religious service: Not on file    Active member of club or organization: Not on file    Attends meetings of clubs or organizations: Not on file    Relationship status: Not on file  . Intimate partner violence:    Fear of current or ex partner: Not on file    Emotionally abused: Not on file    Physically abused: Not on file    Forced sexual activity: Not on file  Other Topics Concern  . Not on file  Social History Narrative  . Not on file    Physical Exam  Constitutional: She is oriented to person, place, and time.  Cardiovascular: Normal rate and regular rhythm.  Pulmonary/Chest: Effort normal and breath sounds normal.  Abdominal: Soft. Bowel sounds are normal.  Musculoskeletal: Normal range of motion. She exhibits edema.  Neurological: She is alert and oriented to person, place, and time.  Skin: Skin is warm and dry.  Psychiatric: She has a normal mood and affect.        Future Appointments  Date Time Provider  [place]  02/13/2018 11:40 AM Bensimhon, Shaune Pascal, MD [facility] None  02/18/2018  7:30 AM [facility]  02/28/2018  8:30 AM Marzetta Board, MD [facility]    BP (!) 104/58 (BP Location: Left Arm, Patient Position: Sitting, Cuff Size: Large)  Pulse 80   Resp 16   Wt 204 lb (92.5 kg)   SpO2 97%   BMI 35.02 kg/m   Weight yesterday- 203 lb  Last visit weight- 201 lb   Ms Archuletta was seen at [facility] today and reported feeling generally well. She denied SOB,headache, dizziness or orthopnea. She stated she has been complaint with her medications and her weight has been stable. Her medications were verified and her pillbox was refilled.  She needs brilinta, atorvastatin, torsemide and isosorbide mononitrate. All have been ordered but she will need the brilinta and isosorbide added to her pillbox.   [name], EMT 01/30/18  ACTION: Home visit completed Next visit planned for Friday

## 2018-02-03 DIAGNOSIS — H35033 Hypertensive retinopathy, bilateral: Secondary | ICD-10-CM | POA: Diagnosis not present

## 2018-02-03 DIAGNOSIS — H401134 Primary open-angle glaucoma, bilateral, indeterminate stage: Secondary | ICD-10-CM | POA: Diagnosis not present

## 2018-02-03 DIAGNOSIS — H04123 Dry eye syndrome of bilateral lacrimal glands: Secondary | ICD-10-CM | POA: Diagnosis not present

## 2018-02-03 DIAGNOSIS — E113393 Type 2 diabetes mellitus with moderate nonproliferative diabetic retinopathy without macular edema, bilateral: Secondary | ICD-10-CM | POA: Diagnosis not present

## 2018-02-04 DIAGNOSIS — R040 Epistaxis: Secondary | ICD-10-CM | POA: Diagnosis not present

## 2018-02-04 DIAGNOSIS — M792 Neuralgia and neuritis, unspecified: Secondary | ICD-10-CM | POA: Diagnosis not present

## 2018-02-04 DIAGNOSIS — Z23 Encounter for immunization: Secondary | ICD-10-CM | POA: Diagnosis not present

## 2018-02-06 ENCOUNTER — Telehealth (HOSPITAL_COMMUNITY): Payer: Self-pay

## 2018-02-06 NOTE — Telephone Encounter (Signed)
I called Danielle [name] to schedule an appointment. She stated she had a lot going on today so we agreed to meet at 09:00 on Friday.

## 2018-02-07 ENCOUNTER — Other Ambulatory Visit (HOSPITAL_COMMUNITY): Payer: Self-pay

## 2018-02-07 NOTE — Progress Notes (Signed)
Paramedicine Encounter    Patient ID: Danielle [name], female    DOB: [birth year], 72 y.o.   MRN: [id number]   Patient Care Team: Seward Carol, MD as PCP - General (Internal Medicine)  Patient Active Problem List   Diagnosis Date Noted  . (HFpEF) heart failure with preserved ejection fraction ([place]) 12/20/2017  . Obesity (BMI 30-39.9) 11/11/2017  . OSA (obstructive sleep apnea) 02/18/2017  . Essential hypertension   . Diabetes mellitus without complication ([place])   . Cancer ([place])   . Thyroid disease     Current Outpatient Medications:  .  acetaminophen (TYLENOL) 650 MG CR tablet, Take 650 mg by mouth every 8 (eight) hours as needed for pain., Disp: , Rfl:  .  allopurinol (ZYLOPRIM) 100 MG tablet, Take 1 tablet (100 mg total) by mouth daily., Disp: , Rfl:  .  aspirin 81 MG tablet, Take 1 tablet (81 mg total) by mouth daily., Disp: 30 tablet, Rfl: 3 .  atorvastatin (LIPITOR) 80 MG tablet, Take 1 tablet (80 mg total) by mouth at bedtime., Disp: 30 tablet, Rfl: 11 .  Biotin 10 MG CAPS, Take 10 mg by mouth daily., Disp: , Rfl:  .  carvedilol (COREG) 25 MG tablet, Take 1 tablet (25 mg total) by mouth 2 (two) times daily., Disp: 180 tablet, Rfl: 3 .  celecoxib (CELEBREX) 200 MG capsule, Take 200 mg by mouth at bedtime. 1 (200 mg) daily before bed, Disp: , Rfl:  .  CHONDROITIN SULFATE PO, Take 250 mg by mouth daily., Disp: , Rfl:  .  ferrous sulfate 325 (65 FE) MG tablet, Take 325 mg by mouth 2 (two) times daily with a meal., Disp: , Rfl:  .  ibuprofen (ADVIL,MOTRIN) 800 MG tablet, Take 800 mg by mouth every 6 (six) hours as needed for mild pain (gout/arthritis pain)., Disp: , Rfl:  .  insulin glargine (LANTUS) 100 UNIT/ML injection, Inject 30 Units into the skin 2 (two) times daily. , Disp: , Rfl:  .  insulin lispro (HUMALOG) 100 UNIT/ML injection, Inject 5 Units into the skin daily with lunch. , Disp: , Rfl:  .  isosorbide mononitrate (IMDUR) 30 MG 24 hr tablet, Take 1 tablet (30 mg total) by  mouth daily., Disp: 30 tablet, Rfl: 3 .  Lactobacillus (ACIDOPHILUS PO), Take 1 capsule by mouth daily., Disp: , Rfl:  .  levothyroxine (SYNTHROID, LEVOTHROID) 75 MCG tablet, Take 75 mcg by mouth daily before breakfast., Disp: , Rfl:  .  losartan (COZAAR) 50 MG tablet, Take 1 tablet (50 mg total) by mouth daily., Disp: 30 tablet, Rfl: 3 .  metFORMIN (GLUCOPHAGE) 500 MG tablet, Take 500 mg by mouth 2 (two) times daily with a meal. , Disp: , Rfl:  .  Multiple Vitamins-Minerals (ALIVE ONCE DAILY WOMENS 50+ PO), Take 1 tablet by mouth daily., Disp: , Rfl:  .  omeprazole (PRILOSEC) 20 MG capsule, Take 20 mg by mouth daily. , Disp: , Rfl:  .  potassium chloride (K-DUR,KLOR-CON) 10 MEQ tablet, Take 2 tablets (20 mEq total) by mouth daily., Disp: 60 tablet, Rfl: 5 .  spironolactone (ALDACTONE) 25 MG tablet, Take 1 tablet (25 mg total) by mouth every other day., Disp: 15 tablet, Rfl: 5 .  ticagrelor (BRILINTA) 90 MG TABS tablet, Take 1 tablet (90 mg total) by mouth 2 (two) times daily., Disp: 60 tablet, Rfl: 5 .  torsemide (DEMADEX) 20 MG tablet, Take 1 tablet (20 mg total) by mouth 2 (two) times daily., Disp: 60 tablet, Rfl: 11 .  Travoprost, BAK Free, (TRAVATAN) 0.004 % SOLN ophthalmic solution, Place 1 drop into both eyes daily., Disp: , Rfl:  .  calcium citrate (CALCITRATE - DOSED IN MG ELEMENTAL CALCIUM) 950 MG tablet, Take 1 tablet by mouth 2 (two) times daily. , Disp: , Rfl:  .  isosorbide mononitrate (IMDUR) 30 MG 24 hr tablet, Take 1 tablet (30 mg total) by mouth daily. (Patient not taking: Reported on 02/07/2018), Disp: 30 tablet, Rfl: 10 .  nitroGLYCERIN (NITROSTAT) 0.4 MG SL tablet, DISSOLVE ONE TABLET UNDER THE TONGUE EVERY 5 MINUTES AS NEEDED FOR CHEST PAIN. (Patient not taking: Reported on 01/24/2018), Disp: 25 tablet, Rfl: 3 Allergies  Allergen Reactions  . Sulfa Antibiotics Rash  . Uloric [Febuxostat] Rash      Social History   Socioeconomic History  . Marital status: Married     Spouse name: Not on file  . Number of children: Not on file  . Years of education: Not on file  . Highest education level: Not on file  Occupational History  . Not on file  Social Needs  . Financial resource strain: Not on file  . Food insecurity:    Worry: Not on file    Inability: Not on file  . Transportation needs:    Medical: Not on file    Non-medical: Not on file  Tobacco Use  . Smoking status: Former Smoker    Packs/day: 1.00    Years: 22.00    Pack years: 22.00    Types: Cigarettes    Last attempt to quit: 1983    Years since quitting: 36.7  . Smokeless tobacco: Never Used  Substance and Sexual Activity  . Alcohol use: Yes    Comment: ocassional  . Drug use: No  . Sexual activity: Never    Birth control/protection: None, Post-menopausal  Lifestyle  . Physical activity:    Days per week: Not on file    Minutes per session: Not on file  . Stress: Not on file  Relationships  . Social connections:    Talks on phone: Not on file    Gets together: Not on file    Attends religious service: Not on file    Active member of club or organization: Not on file    Attends meetings of clubs or organizations: Not on file    Relationship status: Not on file  . Intimate partner violence:    Fear of current or ex partner: Not on file    Emotionally abused: Not on file    Physically abused: Not on file    Forced sexual activity: Not on file  Other Topics Concern  . Not on file  Social History Narrative  . Not on file    Physical Exam  Constitutional: She is oriented to person, place, and time.  Cardiovascular: Normal rate and regular rhythm.  Pulmonary/Chest: Effort normal and breath sounds normal.  Abdominal: Soft.  Musculoskeletal: Normal range of motion. She exhibits edema.  Neurological: She is alert and oriented to person, place, and time.  Skin: Skin is warm and dry.  Psychiatric: She has a normal mood and affect.        Future Appointments  Date Time  Provider [place]  02/13/2018 11:40 AM Bensimhon, Shaune Pascal, MD [facility] None  02/18/2018  7:30 AM [facility] DEVICE REMOTES [facility]  02/28/2018  8:30 AM Marzetta Board, MD [facility]    BP 138/60 (BP Location: Right Arm, Patient Position: Sitting, Cuff Size: Large)  Pulse 70   Resp 16   Wt 205 lb (93 kg)   SpO2 96%   BMI 35.19 kg/m   Weight yesterday- 203 lb Last visit weight- 204 lb  Ms Christians was seen at home today and reported feeling generally well. She denied SOB, headache, dizziness or orthopnea. She has been compliant with her medications and her weight has been stable over the past week. She did complain of bilateral hand pain which she believes to be the result of arthritis. Her medications were verified and her pillbox was refilled.  [name], EMT 02/07/18  ACTION: Home visit completed Next visit planned for 1 week

## 2018-02-12 ENCOUNTER — Telehealth (HOSPITAL_COMMUNITY): Payer: Self-pay

## 2018-02-13 ENCOUNTER — Encounter (HOSPITAL_COMMUNITY): Payer: Self-pay | Admitting: Internal Medicine

## 2018-02-13 ENCOUNTER — Other Ambulatory Visit: Payer: Self-pay

## 2018-02-13 ENCOUNTER — Ambulatory Visit (HOSPITAL_COMMUNITY)
Admission: RE | Admit: 2018-02-13 | Discharge: 2018-02-13 | Disposition: A | Discharge status: Home / Self Care | Payer: Medicare HMO | Source: Ambulatory Visit | Attending: Internal Medicine | Admitting: Internal Medicine

## 2018-02-13 ENCOUNTER — Other Ambulatory Visit (HOSPITAL_COMMUNITY): Payer: Self-pay

## 2018-02-13 VITALS — BP 128/60 | HR 75 | Wt 213.0 lb

## 2018-02-13 DIAGNOSIS — Z853 Personal history of malignant neoplasm of breast: Secondary | ICD-10-CM | POA: Diagnosis not present

## 2018-02-13 DIAGNOSIS — Z882 Allergy status to sulfonamides status: Secondary | ICD-10-CM | POA: Diagnosis not present

## 2018-02-13 DIAGNOSIS — E669 Obesity, unspecified: Secondary | ICD-10-CM | POA: Diagnosis not present

## 2018-02-13 DIAGNOSIS — Z7982 Long term (current) use of aspirin: Secondary | ICD-10-CM | POA: Insufficient documentation

## 2018-02-13 DIAGNOSIS — E119 Type 2 diabetes mellitus without complications: Secondary | ICD-10-CM | POA: Insufficient documentation

## 2018-02-13 DIAGNOSIS — Z6836 Body mass index (BMI) 36.0-36.9, adult: Secondary | ICD-10-CM | POA: Insufficient documentation

## 2018-02-13 DIAGNOSIS — Z923 Personal history of irradiation: Secondary | ICD-10-CM | POA: Insufficient documentation

## 2018-02-13 DIAGNOSIS — I255 Ischemic cardiomyopathy: Secondary | ICD-10-CM | POA: Diagnosis not present

## 2018-02-13 DIAGNOSIS — I5022 Chronic systolic (congestive) heart failure: Secondary | ICD-10-CM | POA: Insufficient documentation

## 2018-02-13 DIAGNOSIS — I5020 Unspecified systolic (congestive) heart failure: Secondary | ICD-10-CM | POA: Diagnosis present

## 2018-02-13 DIAGNOSIS — Z950 Presence of cardiac pacemaker: Secondary | ICD-10-CM | POA: Insufficient documentation

## 2018-02-13 DIAGNOSIS — I5032 Chronic diastolic (congestive) heart failure: Secondary | ICD-10-CM | POA: Diagnosis not present

## 2018-02-13 DIAGNOSIS — I110 Hypertensive heart disease with heart failure: Secondary | ICD-10-CM | POA: Diagnosis not present

## 2018-02-13 DIAGNOSIS — Z794 Long term (current) use of insulin: Secondary | ICD-10-CM | POA: Insufficient documentation

## 2018-02-13 DIAGNOSIS — Z79899 Other long term (current) drug therapy: Secondary | ICD-10-CM | POA: Diagnosis not present

## 2018-02-13 DIAGNOSIS — M1990 Unspecified osteoarthritis, unspecified site: Secondary | ICD-10-CM | POA: Insufficient documentation

## 2018-02-13 DIAGNOSIS — E118 Type 2 diabetes mellitus with unspecified complications: Secondary | ICD-10-CM | POA: Diagnosis not present

## 2018-02-13 DIAGNOSIS — Z9221 Personal history of antineoplastic chemotherapy: Secondary | ICD-10-CM | POA: Insufficient documentation

## 2018-02-13 DIAGNOSIS — E039 Hypothyroidism, unspecified: Secondary | ICD-10-CM | POA: Diagnosis not present

## 2018-02-13 DIAGNOSIS — I10 Essential (primary) hypertension: Secondary | ICD-10-CM

## 2018-02-13 DIAGNOSIS — Z87891 Personal history of nicotine dependence: Secondary | ICD-10-CM | POA: Insufficient documentation

## 2018-02-13 DIAGNOSIS — Z833 Family history of diabetes mellitus: Secondary | ICD-10-CM | POA: Insufficient documentation

## 2018-02-13 DIAGNOSIS — Z807 Family history of other malignant neoplasms of lymphoid, hematopoietic and related tissues: Secondary | ICD-10-CM | POA: Diagnosis not present

## 2018-02-13 DIAGNOSIS — Z888 Allergy status to other drugs, medicaments and biological substances status: Secondary | ICD-10-CM | POA: Diagnosis not present

## 2018-02-13 DIAGNOSIS — G4733 Obstructive sleep apnea (adult) (pediatric): Secondary | ICD-10-CM | POA: Diagnosis not present

## 2018-02-13 DIAGNOSIS — Z955 Presence of coronary angioplasty implant and graft: Secondary | ICD-10-CM | POA: Insufficient documentation

## 2018-02-13 DIAGNOSIS — M109 Gout, unspecified: Secondary | ICD-10-CM | POA: Diagnosis not present

## 2018-02-13 DIAGNOSIS — Z79890 Hormone replacement therapy (postmenopausal): Secondary | ICD-10-CM | POA: Diagnosis not present

## 2018-02-13 DIAGNOSIS — I2510 Atherosclerotic heart disease of native coronary artery without angina pectoris: Secondary | ICD-10-CM | POA: Insufficient documentation

## 2018-02-13 DIAGNOSIS — Z8249 Family history of ischemic heart disease and other diseases of the circulatory system: Secondary | ICD-10-CM | POA: Insufficient documentation

## 2018-02-13 MED ORDER — EMPAGLIFLOZIN 10 MG PO TABS: 10.0000 mg | ORAL_TABLET | Freq: Every day | ORAL | 11 refills | Status: DC

## 2018-02-13 NOTE — Progress Notes (Signed)
Paramedicine Encounter   Patient ID: Danielle [name] , female,   DOB: [birth year],72 y.o.,  MRN: [id number]    Ms Yazdani was seen in the clininc today with Dr Haroldine Laws. She reported feeling well but had moderate weight gain over the past few days. Per Dr Haroldine Laws, extra torsemide was placed in her pillbox over the next two days. Her medications were verified and her pillbox was refilled with the aforementioned addition.   [name], EMT 02/13/2018   ACTION: Next visit planned for 1 week

## 2018-02-13 NOTE — Progress Notes (Signed)
Advanced Heart Failure Clinic Note    Primary Care: Dr. Delfina Redwood Primary Cardiologist: Dr. Haroldine Laws  EP: Dr Tomasa Blase  HPI:  Danielle [name] is a 72 y.o. female with a past medical history of systolic CHF (EF 37-04%), CAD, ischemic cardiomyopathy, HTN, DM, hypothyroidism, and OSA sleep study 03/2017.   Admitted 02/03/17-02/06/17 with SOB, acute on chronic systolic CHF. Diuresed with IV lasix (12 pounds), discharge weight was 215 pounds.   She presents today for regular follow up. She is still having some knee pain, hand and feet pain from gout and arthritis, switched to allopurinol from uloric, no longer on colchicine She has been breathing well but feels a little bloated this morning. Denies chest pain, PND, or Orthopnea. No fever or chills. Weight stable at home at 204lbs on average was 205 yesterday pt took an extro dose of torsemide. She is 213lbs by our scales she feels bloated like she is gaining fluid. Says she is drinking a bunch of water but staying under 2L. Watching salt closely.  Activity level still limited by back and knee pain, not by SOB.  Using cpap every night says she is watching her salt and fluid intake.   ECHO 01/2017- EF 45-50% Grade 2DD Left Atrium Severely dilated.  ECHO 12/2017 EF 45-50%, G2DD, LA mildly dilated, trileaflet moderately thick aortic valve Labs  11/29/17 k 4.3, cr 1.47   Review of systems complete and found to be negative unless listed in HPI.    Past Medical History:  Diagnosis Date  . Acute combined systolic and diastolic congestive heart failure ([place]) 02/05/2017  . Arthritis   . Blood dyscrasia    per pt-has small blood cells-appears as if anemic  . Breast cancer ([place])   . Cancer ([facility])    breast  . CHF (congestive heart failure) ([place])   . Complication of anesthesia    difficult to awaken from per pt  . Diabetes mellitus without complication ([place])   . Dyspnea   . Essential hypertension   . Heart disease   . Hypothyroidism   . Obesity (BMI 30-39.9)  11/11/2017  . OSA (obstructive sleep apnea) 02/18/2017    severe obstructive sleep apnea with an AHI of 75.6/h and mild central sleep apnea with a CAI of 7.7/h.  Oxygen saturations dropped as low as 82%.   He is on CPAP at 9 cm H2O.  . Personal history of chemotherapy   . Personal history of radiation therapy   . Presence of permanent cardiac pacemaker   . Sleep apnea   . Thyroid disease    hypothyroid    Current Outpatient Medications  Medication Sig Dispense Refill  . acetaminophen (TYLENOL) 650 MG CR tablet Take 650 mg by mouth every 8 (eight) hours as needed for pain.    Marland Kitchen allopurinol (ZYLOPRIM) 100 MG tablet Take 1 tablet (100 mg total) by mouth daily.    Marland Kitchen aspirin 81 MG tablet Take 1 tablet (81 mg total) by mouth daily. 30 tablet 3  . atorvastatin (LIPITOR) 80 MG tablet Take 1 tablet (80 mg total) by mouth at bedtime. 30 tablet 11  . Biotin 10 MG CAPS Take 10 mg by mouth daily.    . calcium citrate (CALCITRATE - DOSED IN MG ELEMENTAL CALCIUM) 950 MG tablet Take 1 tablet by mouth 2 (two) times daily.     . carvedilol (COREG) 25 MG tablet Take 1 tablet (25 mg total) by mouth 2 (two) times daily. 180 tablet 3  . celecoxib (CELEBREX) 200 MG  capsule Take 200 mg by mouth at bedtime. 1 (200 mg) daily before bed    . CHONDROITIN SULFATE PO Take 250 mg by mouth daily.    . ferrous sulfate 325 (65 FE) MG tablet Take 325 mg by mouth 2 (two) times daily with a meal.    . ibuprofen (ADVIL,MOTRIN) 800 MG tablet Take 800 mg by mouth every 6 (six) hours as needed for mild pain (gout/arthritis pain).    . insulin glargine (LANTUS) 100 UNIT/ML injection Inject 30 Units into the skin 2 (two) times daily.     . insulin lispro (HUMALOG) 100 UNIT/ML injection Inject 5 Units into the skin daily with lunch.     . isosorbide mononitrate (IMDUR) 30 MG 24 hr tablet Take 1 tablet (30 mg total) by mouth daily. 30 tablet 3  . isosorbide mononitrate (IMDUR) 30 MG 24 hr tablet Take 1 tablet (30 mg total) by mouth  daily. (Patient not taking: Reported on 02/07/2018) 30 tablet 10  . Lactobacillus (ACIDOPHILUS PO) Take 1 capsule by mouth daily.    Marland Kitchen levothyroxine (SYNTHROID, LEVOTHROID) 75 MCG tablet Take 75 mcg by mouth daily before breakfast.    . losartan (COZAAR) 50 MG tablet Take 1 tablet (50 mg total) by mouth daily. 30 tablet 3  . metFORMIN (GLUCOPHAGE) 500 MG tablet Take 500 mg by mouth 2 (two) times daily with a meal.     . Multiple Vitamins-Minerals (ALIVE ONCE DAILY WOMENS 50+ PO) Take 1 tablet by mouth daily.    . nitroGLYCERIN (NITROSTAT) 0.4 MG SL tablet DISSOLVE ONE TABLET UNDER THE TONGUE EVERY 5 MINUTES AS NEEDED FOR CHEST PAIN. (Patient not taking: Reported on 01/24/2018) 25 tablet 3  . omeprazole (PRILOSEC) 20 MG capsule Take 20 mg by mouth daily.     . potassium chloride (K-DUR,KLOR-CON) 10 MEQ tablet Take 2 tablets (20 mEq total) by mouth daily. 60 tablet 5  . spironolactone (ALDACTONE) 25 MG tablet Take 1 tablet (25 mg total) by mouth every other day. 15 tablet 5  . ticagrelor (BRILINTA) 90 MG TABS tablet Take 1 tablet (90 mg total) by mouth 2 (two) times daily. 60 tablet 5  . torsemide (DEMADEX) 20 MG tablet Take 1 tablet (20 mg total) by mouth 2 (two) times daily. 60 tablet 11  . Travoprost, BAK Free, (TRAVATAN) 0.004 % SOLN ophthalmic solution Place 1 drop into both eyes daily.     No current facility-administered medications for this encounter.    Allergies  Allergen Reactions  . Sulfa Antibiotics Rash  . Uloric [Febuxostat] Rash   Social History   Socioeconomic History  . Marital status: Married    Spouse name: Not on file  . Number of children: Not on file  . Years of education: Not on file  . Highest education level: Not on file  Occupational History  . Not on file  Social Needs  . Financial resource strain: Not on file  . Food insecurity:    Worry: Not on file    Inability: Not on file  . Transportation needs:    Medical: Not on file    Non-medical: Not on file    Tobacco Use  . Smoking status: Former Smoker    Packs/day: 1.00    Years: 22.00    Pack years: 22.00    Types: Cigarettes    Last attempt to quit: 1983    Years since quitting: 36.7  . Smokeless tobacco: Never Used  Substance and Sexual Activity  . Alcohol use:  Yes    Comment: ocassional  . Drug use: No  . Sexual activity: Never    Birth control/protection: None, Post-menopausal  Lifestyle  . Physical activity:    Days per week: Not on file    Minutes per session: Not on file  . Stress: Not on file  Relationships  . Social connections:    Talks on phone: Not on file    Gets together: Not on file    Attends religious service: Not on file    Active member of club or organization: Not on file    Attends meetings of clubs or organizations: Not on file    Relationship status: Not on file  . Intimate partner violence:    Fear of current or ex partner: Not on file    Emotionally abused: Not on file    Physically abused: Not on file    Forced sexual activity: Not on file  Other Topics Concern  . Not on file  Social History Narrative  . Not on file   Family History  Problem Relation Age of Onset  . Multiple myeloma Mother   . Heart disease Father   . Other Sister   . Heart disease Brother   . Diabetes Sister   . Other Sister    Vitals:   02/13/18 1143  BP: 128/60  Pulse: 75  SpO2: 100%  Weight: 96.6 kg (213 lb)     Wt Readings from Last 3 Encounters:  02/07/18 93 kg (205 lb)  01/30/18 92.5 kg (204 lb)  01/24/18 91.2 kg (201 lb)    PHYSICAL EXAM: General: Well appearing. No resp difficulty. HEENT: Normal Neck: Supple. JVP difficult to assess thick neck ! 6-7. Carotids 2+ bilat; no bruits. No thyromegaly or nodule noted. Cor: PMI nondisplaced. RRR, 2/6 TR  Lungs: CTAB, normal effort. Abdomen: Obese Soft, non-tender, non-distended, no HSM. No bruits or masses. +BS  Extremities: No cyanosis, clubbing, or rash. Jeans very tight, Ted hose in place likely at  least 1+ edema underneath bilaterally.  Neuro: Alert & orientedx3, cranial nerves grossly intact. moves all 4 extremities w/o difficulty. Affect pleasant   ASSESSMENT & PLAN:  1. Chronic systolic CHF: ICM, Echo 07/3543 EF 45-50%, G2DD, LA mildly dilated, trileaflet moderately thick aortic valve - NYHA II symptoms, confounded by joint pain - Volume status a little up on exam.   - Take 40 mg BID for the next day then back to 52m BID - Continue imdur 30 mg daily - Continue losartan 50 mg daily. - Continue 25 mg spironolactone daily.  - Continue Coreg 25 mg twice a day. - Will start Jardiance today - Reinforced fluid restriction to < 2 L daily, sodium restriction to less than 2000 mg daily, and the importance of daily weights.    2. CAD: [place] in 2010, 2012 and 2016. [place] - No s.s ischemia - Continue ASA and statin.   3. HTN - Blood pressure well controlled. Continue current regimen.  4. DM - Per Dr. PDelfina Redwood - Will start Jardiance today   5. PPM - Dr. CCurt Bearsnow following. No change.   6. Gout - Per PCP.  - Has follow up with Rheumatology   7. Arthritis - Continue tylenol. Per PCP.   8. OSA-  - Reports using her cpap nightly - Follows with Dr TArn Medal MD 02/13/18

## 2018-02-13 NOTE — Patient Instructions (Signed)
START Jardiance 10 mg tablet once daily.  Follow up 4-6 weeks with Dr. Clayborne Dana NP/PA.  ______________________________________________________________ Danielle [name] Code:   Take all medication as prescribed the day of your appointment. Bring all medications with you to your appointment.  Do the following things EVERYDAY: 1) Weigh yourself in the morning before breakfast. Write it down and keep it in a log. 2) Take your medicines as prescribed 3) Eat low salt foods-Limit salt (sodium) to 2000 mg per day.  4) Stay as active as you can everyday 5) Limit all fluids for the day to less than 2 liters

## 2018-02-13 NOTE — Telephone Encounter (Signed)
I called Danielle [name] to remind her of her upcoming appointment at the HF clinic tomorrow. She stated she had her ride arranged and would be there. I asked that she bring her medications with her to the clinic so I could fill her pillbox while there and she agreed.

## 2018-02-17 DIAGNOSIS — E113493 Type 2 diabetes mellitus with severe nonproliferative diabetic retinopathy without macular edema, bilateral: Secondary | ICD-10-CM | POA: Diagnosis not present

## 2018-02-17 DIAGNOSIS — H3562 Retinal hemorrhage, left eye: Secondary | ICD-10-CM | POA: Diagnosis not present

## 2018-02-17 DIAGNOSIS — Z961 Presence of intraocular lens: Secondary | ICD-10-CM | POA: Diagnosis not present

## 2018-02-17 DIAGNOSIS — H3561 Retinal hemorrhage, right eye: Secondary | ICD-10-CM | POA: Diagnosis not present

## 2018-02-18 ENCOUNTER — Ambulatory Visit (INDEPENDENT_AMBULATORY_CARE_PROVIDER_SITE_OTHER): Payer: Medicare HMO | Admitting: *Deleted

## 2018-02-18 ENCOUNTER — Telehealth: Payer: Self-pay

## 2018-02-18 DIAGNOSIS — I495 Sick sinus syndrome: Secondary | ICD-10-CM

## 2018-02-18 NOTE — Progress Notes (Signed)
Remote pacemaker transmission.   

## 2018-02-18 NOTE — Telephone Encounter (Signed)
I spoke with the patient about this.

## 2018-02-19 ENCOUNTER — Telehealth (HOSPITAL_COMMUNITY): Payer: Self-pay

## 2018-02-19 ENCOUNTER — Encounter: Payer: Self-pay | Admitting: Cardiology

## 2018-02-19 NOTE — Telephone Encounter (Signed)
I called Ms Danielle [name] to schedule an appointment. She stated she would be home all day and would be able to meet any time. We agreed to meet at 09:00.

## 2018-02-19 NOTE — Progress Notes (Signed)
Letter  

## 2018-02-20 ENCOUNTER — Other Ambulatory Visit (HOSPITAL_COMMUNITY): Payer: Self-pay | Admitting: Pharmacist

## 2018-02-20 ENCOUNTER — Other Ambulatory Visit (HOSPITAL_COMMUNITY): Payer: Self-pay

## 2018-02-20 MED ORDER — LOSARTAN POTASSIUM 50 MG PO TABS: 50.0000 mg | ORAL_TABLET | Freq: Every day | ORAL | 3 refills | Status: DC

## 2018-02-20 NOTE — Progress Notes (Signed)
Paramedicine Encounter    Patient ID: Danielle [name], female    DOB: [birth year], 72 y.o.   MRN: [id number]   Patient Care Team: Seward Carol, MD as PCP - General (Internal Medicine)  Patient Active Problem List   Diagnosis Date Noted  . (HFpEF) heart failure with preserved ejection fraction ([place]) 12/20/2017  . Obesity (BMI 30-39.9) 11/11/2017  . OSA (obstructive sleep apnea) 02/18/2017  . Essential hypertension   . Diabetes mellitus without complication ([place])   . Cancer ([place])   . Thyroid disease     Current Outpatient Medications:  .  acetaminophen (TYLENOL) 650 MG CR tablet, Take 650 mg by mouth every 8 (eight) hours as needed for pain., Disp: , Rfl:  .  allopurinol (ZYLOPRIM) 100 MG tablet, Take 1 tablet (100 mg total) by mouth daily., Disp: , Rfl:  .  aspirin 81 MG tablet, Take 1 tablet (81 mg total) by mouth daily., Disp: 30 tablet, Rfl: 3 .  atorvastatin (LIPITOR) 80 MG tablet, Take 1 tablet (80 mg total) by mouth at bedtime., Disp: 30 tablet, Rfl: 11 .  Biotin 10 MG CAPS, Take 10 mg by mouth daily., Disp: , Rfl:  .  carvedilol (COREG) 25 MG tablet, Take 1 tablet (25 mg total) by mouth 2 (two) times daily., Disp: 180 tablet, Rfl: 3 .  celecoxib (CELEBREX) 200 MG capsule, Take 200 mg by mouth at bedtime. 1 (200 mg) daily before bed, Disp: , Rfl:  .  CHONDROITIN SULFATE PO, Take 250 mg by mouth daily., Disp: , Rfl:  .  empagliflozin (JARDIANCE) 10 MG TABS tablet, Take 10 mg by mouth daily., Disp: 30 tablet, Rfl: 11 .  ferrous sulfate 325 (65 FE) MG tablet, Take 325 mg by mouth 2 (two) times daily with a meal., Disp: , Rfl:  .  ibuprofen (ADVIL,MOTRIN) 800 MG tablet, Take 800 mg by mouth every 6 (six) hours as needed for mild pain (gout/arthritis pain)., Disp: , Rfl:  .  insulin glargine (LANTUS) 100 UNIT/ML injection, Inject 30 Units into the skin 2 (two) times daily. , Disp: , Rfl:  .  isosorbide mononitrate (IMDUR) 30 MG 24 hr tablet, Take 1 tablet (30 mg total) by mouth daily.,  Disp: 30 tablet, Rfl: 3 .  Lactobacillus (ACIDOPHILUS PO), Take 1 capsule by mouth daily., Disp: , Rfl:  .  levothyroxine (SYNTHROID, LEVOTHROID) 75 MCG tablet, Take 75 mcg by mouth daily before breakfast., Disp: , Rfl:  .  metFORMIN (GLUCOPHAGE) 500 MG tablet, Take 500 mg by mouth 2 (two) times daily with a meal. , Disp: , Rfl:  .  Multiple Vitamins-Minerals (ALIVE ONCE DAILY WOMENS 50+ PO), Take 1 tablet by mouth daily., Disp: , Rfl:  .  omeprazole (PRILOSEC) 20 MG capsule, Take 20 mg by mouth daily. , Disp: , Rfl:  .  potassium chloride (K-DUR,KLOR-CON) 10 MEQ tablet, Take 2 tablets (20 mEq total) by mouth daily., Disp: 60 tablet, Rfl: 5 .  spironolactone (ALDACTONE) 25 MG tablet, Take 1 tablet (25 mg total) by mouth every other day., Disp: 15 tablet, Rfl: 5 .  ticagrelor (BRILINTA) 90 MG TABS tablet, Take 1 tablet (90 mg total) by mouth 2 (two) times daily., Disp: 60 tablet, Rfl: 5 .  torsemide (DEMADEX) 20 MG tablet, Take 1 tablet (20 mg total) by mouth 2 (two) times daily., Disp: 60 tablet, Rfl: 11 .  calcium citrate (CALCITRATE - DOSED IN MG ELEMENTAL CALCIUM) 950 MG tablet, Take 1 tablet by mouth 2 (two) times daily. , Disp: ,  Rfl:  .  insulin lispro (HUMALOG) 100 UNIT/ML injection, Inject 5 Units into the skin daily with lunch. , Disp: , Rfl:  .  losartan (COZAAR) 50 MG tablet, Take 1 tablet (50 mg total) by mouth daily., Disp: 30 tablet, Rfl: 3 .  nitroGLYCERIN (NITROSTAT) 0.4 MG SL tablet, DISSOLVE ONE TABLET UNDER THE TONGUE EVERY 5 MINUTES AS NEEDED FOR CHEST PAIN. (Patient not taking: Reported on 02/13/2018), Disp: 25 tablet, Rfl: 3 .  Travoprost, BAK Free, (TRAVATAN) 0.004 % SOLN ophthalmic solution, Place 1 drop into both eyes daily., Disp: , Rfl:  Allergies  Allergen Reactions  . Sulfa Antibiotics Rash  . Uloric [Febuxostat] Rash      Social History   Socioeconomic History  . Marital status: Married    Spouse name: Not on file  . Number of children: Not on file  . Years  of education: Not on file  . Highest education level: Not on file  Occupational History  . Not on file  Social Needs  . Financial resource strain: Not on file  . Food insecurity:    Worry: Not on file    Inability: Not on file  . Transportation needs:    Medical: Not on file    Non-medical: Not on file  Tobacco Use  . Smoking status: Former Smoker    Packs/day: 1.00    Years: 22.00    Pack years: 22.00    Types: Cigarettes    Last attempt to quit: 1983    Years since quitting: 36.7  . Smokeless tobacco: Never Used  Substance and Sexual Activity  . Alcohol use: Yes    Comment: ocassional  . Drug use: No  . Sexual activity: Never    Birth control/protection: None, Post-menopausal  Lifestyle  . Physical activity:    Days per week: Not on file    Minutes per session: Not on file  . Stress: Not on file  Relationships  . Social connections:    Talks on phone: Not on file    Gets together: Not on file    Attends religious service: Not on file    Active member of club or organization: Not on file    Attends meetings of clubs or organizations: Not on file    Relationship status: Not on file  . Intimate partner violence:    Fear of current or ex partner: Not on file    Emotionally abused: Not on file    Physically abused: Not on file    Forced sexual activity: Not on file  Other Topics Concern  . Not on file  Social History Narrative  . Not on file    Physical Exam  Constitutional: She is oriented to person, place, and time.  Cardiovascular: Normal rate and regular rhythm.  Pulmonary/Chest: Effort normal and breath sounds normal.  Abdominal: Soft.  Musculoskeletal: Normal range of motion. She exhibits edema.  Neurological: She is alert and oriented to person, place, and time.  Skin: Skin is warm and dry.  Psychiatric: She has a normal mood and affect.        Future Appointments  Date Time Provider [place]  02/28/2018  8:30 AM Marzetta Board, MD  [facility]  03/13/2018 11:00 AM [facility] PA/NP [facility] None  05/20/2018  8:10 AM [facility] DEVICE REMOTES [facility]    BP 112/60 (BP Location: Left Arm, Patient Position: Sitting, Cuff Size: Large)   Pulse 77   Resp 16   Wt 205 lb (93 kg)  SpO2 96%   BMI 35.19 kg/m   Weight yesterday- 204 lb Last visit weight-205 lb  Ms Koebel was seen at home today and reported feeling well. She stated she has been compliant with her medications over the past week and her weight has been stable. She denied SOB, headache, dizziness or orthopnea. Her medications were verified and her pillbox was refilled. She ran out of losartan and was out of refills so I contacted the clinic and had a new prescription was sent to her pharmacy.   [name], EMT 02/20/18  ACTION: Home visit completed Next visit planned for 1 week

## 2018-02-25 DIAGNOSIS — G4733 Obstructive sleep apnea (adult) (pediatric): Secondary | ICD-10-CM | POA: Diagnosis not present

## 2018-02-26 ENCOUNTER — Telehealth: Payer: Self-pay | Admitting: *Deleted

## 2018-02-26 LAB — CUP PACEART REMOTE DEVICE CHECK
Battery Voltage: 3.02 V
Brady Statistic AP VP Percent: 0 %
Brady Statistic RA Percent Paced: 1.85 %
Brady Statistic RV Percent Paced: 0.05 %
Implantable Lead Implant Date: 20160222
Implantable Lead Location: 753860
Implantable Lead Model: 5076
Lead Channel Impedance Value: 399 Ohm
Lead Channel Impedance Value: 494 Ohm
Lead Channel Pacing Threshold Amplitude: 0.625 V
Lead Channel Pacing Threshold Pulse Width: 0.4 ms
Lead Channel Pacing Threshold Pulse Width: 0.4 ms
Lead Channel Sensing Intrinsic Amplitude: 13.625 mV
Lead Channel Setting Pacing Amplitude: 2 V
Lead Channel Setting Pacing Pulse Width: 0.4 ms
Lead Channel Setting Sensing Sensitivity: 2 mV
MDC IDC LEAD IMPLANT DT: 20160222
MDC IDC LEAD LOCATION: 753859
MDC IDC MSMT BATTERY REMAINING LONGEVITY: 92 mo
MDC IDC MSMT LEADCHNL RA IMPEDANCE VALUE: 361 Ohm
MDC IDC MSMT LEADCHNL RA IMPEDANCE VALUE: 399 Ohm
MDC IDC MSMT LEADCHNL RA SENSING INTR AMPL: 4.125 mV
MDC IDC MSMT LEADCHNL RA SENSING INTR AMPL: 4.125 mV
MDC IDC MSMT LEADCHNL RV PACING THRESHOLD AMPLITUDE: 0.75 V
MDC IDC MSMT LEADCHNL RV SENSING INTR AMPL: 13.625 mV
MDC IDC PG IMPLANT DT: 20160222
MDC IDC SESS DTM: 20190924164024
MDC IDC SET LEADCHNL RV PACING AMPLITUDE: 2 V
MDC IDC STAT BRADY AP VS PERCENT: 1.85 %
MDC IDC STAT BRADY AS VP PERCENT: 0.04 %
MDC IDC STAT BRADY AS VS PERCENT: 98.1 %

## 2018-02-26 NOTE — Telephone Encounter (Signed)
-----   Message from Will Meredith Leeds, MD sent at 02/26/2018  2:50 PM EDT ----- Abnormal device interrogation reviewed.  Lead parameters and battery status stable.  AF seen. Patient to come to clinic, APP clinic, or AF clinic to discuss anticoagulation.

## 2018-02-26 NOTE — Telephone Encounter (Signed)
Danielle [name] made aware of findings on last PPM transmission- explained AF and generic risks. She is agreeable to schedule appt with AF Clinic Monday 03/03/18 at 3:30pm. She will have her daughter with her. No available appts for WC until late Nov.

## 2018-02-27 ENCOUNTER — Other Ambulatory Visit (HOSPITAL_COMMUNITY): Payer: Self-pay

## 2018-02-27 NOTE — Progress Notes (Signed)
Paramedicine Encounter    Patient ID: Danielle [name], female    DOB: 18-Jul-1945, 72 y.o.   MRN: [id number]   Patient Care Team: Seward Carol, MD as PCP - General (Internal Medicine)  Patient Active Problem List   Diagnosis Date Noted  . (HFpEF) heart failure with preserved ejection fraction ([place]) 12/20/2017  . Obesity (BMI 30-39.9) 11/11/2017  . OSA (obstructive sleep apnea) 02/18/2017  . Essential hypertension   . Diabetes mellitus without complication ([place])   . Cancer ([place])   . Thyroid disease     Current Outpatient Medications:  .  acetaminophen (TYLENOL) 650 MG CR tablet, Take 650 mg by mouth every 8 (eight) hours as needed for pain., Disp: , Rfl:  .  allopurinol (ZYLOPRIM) 100 MG tablet, Take 1 tablet (100 mg total) by mouth daily., Disp: , Rfl:  .  aspirin 81 MG tablet, Take 1 tablet (81 mg total) by mouth daily., Disp: 30 tablet, Rfl: 3 .  atorvastatin (LIPITOR) 80 MG tablet, Take 1 tablet (80 mg total) by mouth at bedtime., Disp: 30 tablet, Rfl: 11 .  Biotin 10 MG CAPS, Take 10 mg by mouth daily., Disp: , Rfl:  .  carvedilol (COREG) 25 MG tablet, Take 1 tablet (25 mg total) by mouth 2 (two) times daily., Disp: 180 tablet, Rfl: 3 .  celecoxib (CELEBREX) 200 MG capsule, Take 200 mg by mouth at bedtime. 1 (200 mg) daily before bed, Disp: , Rfl:  .  CHONDROITIN SULFATE PO, Take 250 mg by mouth daily., Disp: , Rfl:  .  empagliflozin (JARDIANCE) 10 MG TABS tablet, Take 10 mg by mouth daily., Disp: 30 tablet, Rfl: 11 .  ferrous sulfate 325 (65 FE) MG tablet, Take 325 mg by mouth 2 (two) times daily with a meal., Disp: , Rfl:  .  insulin glargine (LANTUS) 100 UNIT/ML injection, Inject 30 Units into the skin 2 (two) times daily. , Disp: , Rfl:  .  insulin lispro (HUMALOG) 100 UNIT/ML injection, Inject 5 Units into the skin daily with lunch. , Disp: , Rfl:  .  isosorbide mononitrate (IMDUR) 30 MG 24 hr tablet, Take 1 tablet (30 mg total) by mouth daily., Disp: 30 tablet, Rfl: 3 .   Lactobacillus (ACIDOPHILUS PO), Take 1 capsule by mouth daily., Disp: , Rfl:  .  levothyroxine (SYNTHROID, LEVOTHROID) 75 MCG tablet, Take 75 mcg by mouth daily before breakfast., Disp: , Rfl:  .  losartan (COZAAR) 50 MG tablet, Take 1 tablet (50 mg total) by mouth daily., Disp: 30 tablet, Rfl: 3 .  metFORMIN (GLUCOPHAGE) 500 MG tablet, Take 500 mg by mouth 2 (two) times daily with a meal. , Disp: , Rfl:  .  Multiple Vitamins-Minerals (ALIVE ONCE DAILY WOMENS 50+ PO), Take 1 tablet by mouth daily., Disp: , Rfl:  .  omeprazole (PRILOSEC) 20 MG capsule, Take 20 mg by mouth daily. , Disp: , Rfl:  .  potassium chloride (K-DUR,KLOR-CON) 10 MEQ tablet, Take 2 tablets (20 mEq total) by mouth daily., Disp: 60 tablet, Rfl: 5 .  spironolactone (ALDACTONE) 25 MG tablet, Take 1 tablet (25 mg total) by mouth every other day., Disp: 15 tablet, Rfl: 5 .  ticagrelor (BRILINTA) 90 MG TABS tablet, Take 1 tablet (90 mg total) by mouth 2 (two) times daily., Disp: 60 tablet, Rfl: 5 .  torsemide (DEMADEX) 20 MG tablet, Take 1 tablet (20 mg total) by mouth 2 (two) times daily., Disp: 60 tablet, Rfl: 11 .  Travoprost, BAK Free, (TRAVATAN) 0.004 % SOLN  ophthalmic solution, Place 1 drop into both eyes daily., Disp: , Rfl:  .  calcium citrate (CALCITRATE - DOSED IN MG ELEMENTAL CALCIUM) 950 MG tablet, Take 1 tablet by mouth 2 (two) times daily. , Disp: , Rfl:  .  ibuprofen (ADVIL,MOTRIN) 800 MG tablet, Take 800 mg by mouth every 6 (six) hours as needed for mild pain (gout/arthritis pain)., Disp: , Rfl:  .  nitroGLYCERIN (NITROSTAT) 0.4 MG SL tablet, DISSOLVE ONE TABLET UNDER THE TONGUE EVERY 5 MINUTES AS NEEDED FOR CHEST PAIN. (Patient not taking: Reported on 02/13/2018), Disp: 25 tablet, Rfl: 3 Allergies  Allergen Reactions  . Sulfa Antibiotics Rash  . Uloric [Febuxostat] Rash      Social History   Socioeconomic History  . Marital status: Married    Spouse name: Not on file  . Number of children: Not on file  .  Years of education: Not on file  . Highest education level: Not on file  Occupational History  . Not on file  Social Needs  . Financial resource strain: Not on file  . Food insecurity:    Worry: Not on file    Inability: Not on file  . Transportation needs:    Medical: Not on file    Non-medical: Not on file  Tobacco Use  . Smoking status: Former Smoker    Packs/day: 1.00    Years: 22.00    Pack years: 22.00    Types: Cigarettes    Last attempt to quit: 1983    Years since quitting: 36.7  . Smokeless tobacco: Never Used  Substance and Sexual Activity  . Alcohol use: Yes    Comment: ocassional  . Drug use: No  . Sexual activity: Never    Birth control/protection: None, Post-menopausal  Lifestyle  . Physical activity:    Days per week: Not on file    Minutes per session: Not on file  . Stress: Not on file  Relationships  . Social connections:    Talks on phone: Not on file    Gets together: Not on file    Attends religious service: Not on file    Active member of club or organization: Not on file    Attends meetings of clubs or organizations: Not on file    Relationship status: Not on file  . Intimate partner violence:    Fear of current or ex partner: Not on file    Emotionally abused: Not on file    Physically abused: Not on file    Forced sexual activity: Not on file  Other Topics Concern  . Not on file  Social History Narrative  . Not on file    Physical Exam  Constitutional: She is oriented to person, place, and time.  Cardiovascular: Normal rate and regular rhythm.  Pulmonary/Chest: Effort normal and breath sounds normal.  Abdominal: Soft.  Musculoskeletal: Normal range of motion. She exhibits edema.  Neurological: She is alert and oriented to person, place, and time.  Skin: Skin is warm and dry.  Psychiatric: She has a normal mood and affect.        Future Appointments  Date Time Provider [place]  02/28/2018  8:30 AM Marzetta Board, MD [facility]  03/03/2018  3:30 PM Sherran Needs, NP [facility] None  03/13/2018 11:00 AM [facility] PA/NP [facility] None  05/20/2018  8:10 AM [facility] DEVICE REMOTES [facility]    BP 110/60 (BP Location: Right Arm, Patient Position: Sitting, Cuff Size: Large)   Pulse  76   Resp 18   Wt 202 lb (91.6 kg)   SpO2 97%   BMI 34.67 kg/m   Weight yesterday- 205 lb Last visit weight- 205 lb  Danielle [name] was seen at home today and reported feeling generally well. She denied SOB, headache, dizziness or orthopnea. She has been compliant with her medications and her weight has been stable. Her medications were verified and her pillbox was refilled.   Danielle [name], EMT 02/27/18  ACTION: Home visit completed Next visit planned for 1 week

## 2018-02-28 ENCOUNTER — Encounter: Payer: Self-pay | Admitting: Podiatry

## 2018-02-28 ENCOUNTER — Ambulatory Visit: Payer: Medicare HMO | Admitting: Podiatry

## 2018-02-28 VITALS — BP 114/54 | HR 68

## 2018-02-28 DIAGNOSIS — M79674 Pain in right toe(s): Secondary | ICD-10-CM

## 2018-02-28 DIAGNOSIS — E1142 Type 2 diabetes mellitus with diabetic polyneuropathy: Secondary | ICD-10-CM | POA: Diagnosis not present

## 2018-02-28 DIAGNOSIS — M79675 Pain in left toe(s): Secondary | ICD-10-CM | POA: Diagnosis not present

## 2018-02-28 DIAGNOSIS — B351 Tinea unguium: Secondary | ICD-10-CM | POA: Diagnosis not present

## 2018-02-28 DIAGNOSIS — L84 Corns and callosities: Secondary | ICD-10-CM | POA: Diagnosis not present

## 2018-02-28 NOTE — Patient Instructions (Addendum)
Diabetes and Foot Care Diabetes may cause you to have problems because of poor blood supply (circulation) to your feet and legs. This may cause the skin on your feet to become thinner, break easier, and heal more slowly. Your skin may become dry, and the skin may peel and crack. You may also have nerve damage in your legs and feet causing decreased feeling in them. You may not notice minor injuries to your feet that could lead to infections or more serious problems. Taking care of your feet is one of the most important things you can do for yourself. Follow these instru Diabetic Neuropathy Diabetic neuropathy is a nerve disease or nerve damage that is caused by diabetes mellitus. About half of all people with diabetes mellitus have some form of nerve damage. Nerve damage is more common in those who have had diabetes mellitus for many years and who generally have not had good control of their blood sugar (glucose) level. Diabetic neuropathy is a common complication of diabetes mellitus. There are three common types of diabetic neuropathy and a fourth type that is less common and less understood:  Peripheral neuropathy-This is the most common type of diabetic neuropathy. It causes damage to the nerves of the feet and legs first and then eventually the hands and arms. The damage affects the ability to sense touch.  Autonomic neuropathy-This type causes damage to the autonomic nervous system, which controls the following functions: ? Heartbeat. ? Body temperature. ? Blood pressure. ? Urination. ? Digestion. ? Sweating. ? Sexual function.  Focal neuropathy-Focal neuropathy can be painful and unpredictable and occurs most often in older adults with diabetes mellitus. It involves a specific nerve or one area and often comes on suddenly. It usually does not cause long-term problems.  Radiculoplexus neuropathy- Sometimes called lumbosacral radiculoplexus neuropathy, radiculoplexus neuropathy affects the  nerves of the thighs, hips, buttocks, or legs. It is more common in people with type 2 diabetes mellitus and in older men. It is characterized by debilitating pain, weakness, and atrophy, usually in the thigh muscles.  What are the causes? The cause of peripheral, autonomic, and focal neuropathies is diabetes mellitus that is uncontrolled and high glucose levels. The cause of radiculoplexus neuropathy is unknown. However, it is thought to be caused by inflammation related to uncontrolled glucose levels. What are the signs or symptoms? Peripheral Neuropathy Peripheral neuropathy develops slowly over time. When the nerves of the feet and legs no longer work there may be:  Burning, stabbing, or aching pain in the legs or feet.  Inability to feel pressure or pain in your feet. This can lead to: ? Thick calluses over pressure areas. ? Pressure sores. ? Ulcers.  Foot deformities.  Reduced ability to feel temperature changes.  Muscle weakness.  Autonomic Neuropathy The symptoms of autonomic neuropathy vary depending on which nerves are affected. Symptoms may include:  Problems with digestion, such as: ? Feeling sick to your stomach (nausea). ? Vomiting. ? Bloating. ? Constipation. ? Diarrhea. ? Abdominal pain.  Difficulty with urination. This occurs if you lose your ability to sense when your bladder is full. Problems include: ? Urine leakage (incontinence). ? Inability to empty your bladder completely (retention).  Rapid or irregular heartbeat (palpitations).  Blood pressure drops when you stand up (orthostatic hypotension). When you stand up you may feel: ? Dizzy. ? Weak. ? Faint.  In men, inability to attain and maintain an erection.  In women, vaginal dryness and problems with decreased sexual desire and arousal.  Problems with body temperature regulation.  Increased or decreased sweating.  Focal Neuropathy  Abnormal eye movements or abnormal alignment of both  eyes.  Weakness in the wrist.  Foot drop. This results in an inability to lift the foot properly and abnormal walking or foot movement.  Paralysis on one side of your face (Bell palsy).  Chest or abdominal pain. Radiculoplexus Neuropathy  Sudden, severe pain in your hip, thigh, or buttocks.  Weakness and wasting of thigh muscles.  Difficulty rising from a seated position.  Abdominal swelling.  Unexplained weight loss (usually more than 10 lb [4.5 kg]). How is this diagnosed? Peripheral Neuropathy Your senses may be tested. Sensory function testing can be done with:  A light touch using a monofilament.  A vibration with tuning fork.  A sharp sensation with a pin prick.  Other tests that can help diagnose neuropathy are:  Nerve conduction velocity. This test checks the transmission of an electrical current through a nerve.  Electromyography. This shows how muscles respond to electrical signals transmitted by nearby nerves.  Quantitative sensory testing. This is used to assess how your nerves respond to vibrations and changes in temperature.  Autonomic Neuropathy Diagnosis is often based on reported symptoms. Tell your health care provider if you experience:  Dizziness.  Constipation.  Diarrhea.  Inappropriate urination or inability to urinate.  Inability to get or maintain an erection.  Tests that may be done include:  Electrocardiography or Holter monitor. These are tests that can help show problems with the heart rate or heart rhythm.  An X-ray exam may be done.  Focal Neuropathy Diagnosis is made based on your symptoms and what your health care provider finds during your exam. Other tests may be done. They may include:  Nerve conduction velocities. This checks the transmission of electrical current through a nerve.  Electromyography. This shows how muscles respond to electrical signals transmitted by nearby nerves.  Quantitative sensory testing. This  test is used to assess how your nerves respond to vibration and changes in temperature.  Radiculoplexus Neuropathy  Often the first thing is to eliminate any other issue or problems that might be the cause, as there is no standard test for diagnosis.  X-ray exam of your spine and lumbar region.  Spinal tap to rule out cancer.  MRI to rule out other lesions. How is this treated? Once nerve damage occurs, it cannot be reversed. The goal of treatment is to keep the disease or nerve damage from getting worse and affecting more nerve fibers. Controlling your blood glucose level is the key. Most people with radiculoplexus neuropathy see at least a partial improvement over time. You will need to keep your blood glucose and HbA1c levels in the target range determined by your health care provider. Things that help control blood glucose levels include:  Blood glucose monitoring.  Meal planning.  Physical activity.  Diabetes medicine.  Over time, maintaining lower blood glucose levels helps lessen symptoms. Sometimes, prescription pain medicine is needed. Follow these instructions at home:  Do not smoke.  Keep your blood glucose level in the range that you and your health care provider have determined acceptable for you.  Keep your blood pressure level in the range that you and your health care provider have determined acceptable for you.  Eat a well-balanced diet.  Be physically active every day. Include strength training and balance exercises.  Protect your feet. ? Check your feet every day for sores, cuts, blisters, or signs of infection. ?  Wear padded socks and supportive shoes. Use orthotic inserts, if necessary. ? Regularly check the insides of your shoes for worn spots. Make sure there are no rocks or other items inside your shoes before you put them on. Contact a health care provider if:  You have burning, stabbing, or aching pain in the legs or feet.  You are unable to feel  pressure or pain in your feet.  You develop problems with digestion such as: ? Nausea. ? Vomiting. ? Bloating. ? Constipation. ? Diarrhea. ? Abdominal pain.  You have difficulty with urination, such as: ? Incontinence. ? Retention.  You have palpitations.  You develop orthostatic hypotension. When you stand up you may feel: ? Dizzy. ? Weak. ? Faint.  You cannot attain and maintain an erection (in men).  You have vaginal dryness and problems with decreased sexual desire and arousal (in women).  You have severe pain in your thighs, legs, or buttocks.  You have unexplained weight loss. This information is not intended to replace advice given to you by your health care provider. Make sure you discuss any questions you have with your health care provider. Document Released: 07/23/2001 Document Revised: 10/20/2015 Document Reviewed: 10/23/2012 Elsevier Interactive Patient Education  2017 [place] at home:  Wear shoes at all times, even in the house. Do not go barefoot. Bare feet are easily injured.  Check your feet daily for blisters, cuts, and redness. If you cannot see the bottom of your feet, use a mirror or ask someone for help.  Wash your feet with warm water (do not use hot water) and mild soap. Then pat your feet and the areas between your toes until they are completely dry. Do not soak your feet as this can dry your skin.  Apply a moisturizing lotion or petroleum jelly (that does not contain alcohol and is unscented) to the skin on your feet and to dry, brittle toenails. Do not apply lotion between your toes.  Trim your toenails straight across. Do not dig under them or around the cuticle. File the edges of your nails with an emery board or nail file.  Do not cut corns or calluses or try to remove them with medicine.  Wear clean socks or stockings every day. Make sure they are not too tight. Do not wear knee-high stockings since they may decrease blood flow  to your legs.  Wear shoes that fit properly and have enough cushioning. To break in new shoes, wear them for just a few hours a day. This prevents you from injuring your feet. Always look in your shoes before you put them on to be sure there are no objects inside.  Do not cross your legs. This may decrease the blood flow to your feet.  If you find a minor scrape, cut, or break in the skin on your feet, keep it and the skin around it clean and dry. These areas may be cleansed with mild soap and water. Do not cleanse the area with peroxide, alcohol, or iodine.  When you remove an adhesive bandage, be sure not to damage the skin around it.  If you have a wound, look at it several times a day to make sure it is healing.  Do not use heating pads or hot water bottles. They may burn your skin. If you have lost feeling in your feet or legs, you may not know it is happening until it is too late.  Make sure your health care provider performs  a complete foot exam at least annually or more often if you have foot problems. Report any cuts, sores, or bruises to your health care provider immediately. Contact a health care provider if:  You have an injury that is not healing.  You have cuts or breaks in the skin.  You have an ingrown nail.  You notice redness on your legs or feet.  You feel burning or tingling in your legs or feet.  You have pain or cramps in your legs and feet.  Your legs or feet are numb.  Your feet always feel cold. Get help right away if:  There is increasing redness, swelling, or pain in or around a wound.  There is a red line that goes up your leg.  Pus is coming from a wound.  You develop a fever or as directed by your health care provider.  You notice a bad smell coming from an ulcer or wound. This information is not intended to replace advice given to you by your health care provider. Make sure you discuss any questions you have with your health care  provider. Document Released: 05/11/2000 Document Revised: 10/20/2015 Document Reviewed: 10/21/2012 Elsevier Interactive Patient Education  2017 [employer].

## 2018-03-02 NOTE — Progress Notes (Addendum)
Subjective: Danielle [name] presents to clinic today for diabetic foot examination. She relates b/l foot swelling,  diabetes, diabetic neuropathy and elongated, painful, discolored, thick toenails b/l feet. She also has callus right foot which interfere with activities of daily living. Pain is aggravated when wearing enclosed shoe gear. She denies any attempt at treatment personally or professionally.  Reviewed medications and allergies with [name]. She stated she is not taking her Humalog insulin as prescribed because she feels she is on too much medication for her diabetes.   Medical History   11/11/2017 Obesity (BMI 30-39.9)  02/18/2017 OSA (obstructive sleep apnea)   02/05/2017 Acute combined systolic and diastolic congestive heart failure (HCC)  Date Unknown Arthritis  Date Unknown Blood dyscrasia   Date Unknown Breast cancer ([place])  Date Unknown [facility])   Date Unknown CHF (congestive heart failure) ([place])  Date Unknown Complication of anesthesia   Date Unknown Diabetes mellitus without complication ([place])  Date Unknown Dyspnea  Date Unknown Essential hypertension  Date Unknown Heart disease  Date Unknown Hypothyroidism  Date Unknown Personal history of chemotherapy  Date Unknown Personal history of radiation therapy  Date Unknown Presence of permanent cardiac pacemaker  Date Unknown Sleep apnea  Date Unknown Thyroid disease    Problem List   New problems from outside sources are available for reconciliation  Cardiovascular and Mediastinum   (HFpEF) heart failure with preserved ejection fraction ([place])   Essential hypertension  Respiratory   OSA (obstructive sleep apnea)  Endocrine   Diabetes mellitus without complication (HCC)   Thyroid disease  Other   Cancer ([place])   Obesity (BMI 30-39.9)   Surgical History   07/19/2014 Insert / replace / remove pacemaker  09/2012 Replacement total knee (Left)  1985 Breast lumpectomy with needle localization and axillary lymph  node dissection  1978 Thyroidectomy, partial  Date Unknown Breast biopsy  Date Unknown Breast lumpectomy (Left)   Date Unknown Breast surgery (Left)   2010, 2012,2017 Coronary stent placement   Date Unknown Eye surgery (Bilateral)   Date Unknown Tonsillectomy   Medications   New medications from outside sources are available for reconciliation   acetaminophen (TYLENOL) 650 MG CR tablet    allopurinol (ZYLOPRIM) 100 MG tablet    aspirin 81 MG tablet    atorvastatin (LIPITOR) 80 MG tablet    Biotin 10 MG CAPS    calcium citrate (CALCITRATE - DOSED IN MG ELEMENTAL CALCIUM) 950 MG tablet    carvedilol (COREG) 25 MG tablet    celecoxib (CELEBREX) 200 MG capsule    CHONDROITIN SULFATE PO    empagliflozin (JARDIANCE) 10 MG TABS tablet    ferrous sulfate 325 (65 FE) MG tablet    ibuprofen (ADVIL,MOTRIN) 800 MG tablet    insulin glargine (LANTUS) 100 UNIT/ML injection    insulin lispro (HUMALOG) 100 UNIT/ML injection    isosorbide mononitrate (IMDUR) 30 MG 24 hr tablet    Lactobacillus (ACIDOPHILUS PO)    levothyroxine (SYNTHROID, LEVOTHROID) 75 MCG tablet    losartan (COZAAR) 50 MG tablet    metFORMIN (GLUCOPHAGE) 500 MG tablet    Multiple Vitamins-Minerals (ALIVE ONCE DAILY WOMENS 50+ PO)    nitroGLYCERIN (NITROSTAT) 0.4 MG SL tablet    omeprazole (PRILOSEC) 20 MG capsule    potassium chloride (K-DUR,KLOR-CON) 10 MEQ tablet    spironolactone (ALDACTONE) 25 MG tablet    ticagrelor (BRILINTA) 90 MG TABS tablet    torsemide (DEMADEX) 20 MG tablet    Travoprost, BAK Free, (TRAVATAN) 0.004 % SOLN  ophthalmic solution    Allergies     Sulfa AntibioticsRash  Uloric [Febuxostat]Rash   Tobacco History   Smoking Status  Former Smoker  Quit 1983  Types  Cigarettes  Amount 1 packs/day for 22 years (22.00 pk-yrs)      Smokeless Tobacco Status  Never Used   Family History   Mother (Deceased) Multiple myeloma         Father (Deceased) Heart disease         Sister (Deceased) Other          Brother Heart disease         Sister Diabetes         Sister Other   REVIEW OF SYSTEMS:  X denotes positive finding Cardiac   Comments:  Chest pain or chest pressure:      Shortness of breath upon exertion: x    Short of breath when lying flat:      Irregular heart rhythm:       Cardiac Pacemaker x     Vascular      Pain in calf, thigh, or hip brought on by ambulation:      Pain in feet at night that wakes you up from your sleep:       Blood clot in your veins:      Leg swelling:  x     Foot swelling                                        x     Pulmonary      Oxygen at home:      Productive cough:       Wheezing:              Neurologic      Sudden weakness in arms or legs:       Sudden numbness in arms or legs:       Sudden onset of difficulty speaking or slurred speech:      Temporary loss of vision in one eye:       Problems with dizziness:        Numbness in feet x    Gastrointestinal      Blood in stool:       Vomited blood:       Change in appetite:      Genitourinary      Burning when urinating:       Blood in urine:             Psychiatric      Major depression:       Anxiety:      Hematologic      Bleeding problems:      Problems with blood clotting too easily:             Skin      Rashes or ulcers:             Constitutional      Fever or chills:         Objective: Vitals:   02/28/18 0852  BP: (!) 114/54  Pulse: 68   Vascular Examination: Capillary refill time immediate x 10 digits Dorsalis pedis present b/l Posterior tibial pulses nonpalpable b/l No digital hair x 10 digits Skin temperature WNL b/l Nonpitting edema noted b/l ankles and feet   Dermatological Examination: Skin with normal turgor, texture  and tone b/l Toenails 1-5 b/l discolored, thick, dystrophic with subungual debris and pain with palpation to nailbeds due to thickness of nails. Hyperkeratotic lesion submetatarsal head 5 right foot with no flocculence, no  edema, no erythema. No underlying wound nor signs of infection.  Musculoskeletal: Muscle strength 5/5 to all LE muscle groups  Neurological: Sensation intact with 10 gram monofilament b/l Vibratory sensation intact b/l  Assessment: 1. Painful onychomycosis toenails 1-5 b/l 2. Callus submetatarsal head 5 right foot 3. NIDDM with Neuropathy  Plan: 1. Discussed diabetic foot care principles. Educational literature dispensed to patient regarding diabetes and foot care and neuropathy. 2. Diabetic shoe form completed with diagnoses of NIDDM with neuropathy and callus submetatarsal head 5 right foot 3. Toenails 1-5 b/l were debrided in length and girth without iatrogenic bleeding. 4. Hyperkeratotic lesion pared with sterile chisel blade submetatarsal head 5 right foot 5. Patient to continue soft, supportive shoe gear 6. Patient to report any pedal injuries to medical professional  7. Follow up 3 months. Patient/POA to call should there be a concern in the interim.

## 2018-03-03 ENCOUNTER — Encounter (HOSPITAL_COMMUNITY): Payer: Self-pay | Admitting: Nurse Practitioner

## 2018-03-03 ENCOUNTER — Ambulatory Visit (HOSPITAL_COMMUNITY)
Admission: RE | Admit: 2018-03-03 | Discharge: 2018-03-03 | Disposition: A | Discharge status: Home / Self Care | Payer: Medicare HMO | Source: Ambulatory Visit | Attending: Nurse Practitioner | Admitting: Nurse Practitioner

## 2018-03-03 VITALS — BP 96/54 | HR 72 | Ht 64.0 in | Wt 207.0 lb

## 2018-03-03 DIAGNOSIS — I4891 Unspecified atrial fibrillation: Secondary | ICD-10-CM

## 2018-03-03 DIAGNOSIS — Z87891 Personal history of nicotine dependence: Secondary | ICD-10-CM | POA: Diagnosis not present

## 2018-03-03 DIAGNOSIS — I5042 Chronic combined systolic (congestive) and diastolic (congestive) heart failure: Secondary | ICD-10-CM | POA: Diagnosis not present

## 2018-03-03 DIAGNOSIS — Z7901 Long term (current) use of anticoagulants: Secondary | ICD-10-CM | POA: Insufficient documentation

## 2018-03-03 DIAGNOSIS — E890 Postprocedural hypothyroidism: Secondary | ICD-10-CM | POA: Insufficient documentation

## 2018-03-03 DIAGNOSIS — Z794 Long term (current) use of insulin: Secondary | ICD-10-CM | POA: Insufficient documentation

## 2018-03-03 DIAGNOSIS — Z79890 Hormone replacement therapy (postmenopausal): Secondary | ICD-10-CM | POA: Insufficient documentation

## 2018-03-03 DIAGNOSIS — Z882 Allergy status to sulfonamides status: Secondary | ICD-10-CM | POA: Insufficient documentation

## 2018-03-03 DIAGNOSIS — M1990 Unspecified osteoarthritis, unspecified site: Secondary | ICD-10-CM | POA: Insufficient documentation

## 2018-03-03 DIAGNOSIS — E669 Obesity, unspecified: Secondary | ICD-10-CM | POA: Diagnosis not present

## 2018-03-03 DIAGNOSIS — Z6830 Body mass index (BMI) 30.0-30.9, adult: Secondary | ICD-10-CM | POA: Insufficient documentation

## 2018-03-03 DIAGNOSIS — Z833 Family history of diabetes mellitus: Secondary | ICD-10-CM | POA: Insufficient documentation

## 2018-03-03 DIAGNOSIS — G4733 Obstructive sleep apnea (adult) (pediatric): Secondary | ICD-10-CM | POA: Diagnosis not present

## 2018-03-03 DIAGNOSIS — Z9012 Acquired absence of left breast and nipple: Secondary | ICD-10-CM | POA: Diagnosis not present

## 2018-03-03 DIAGNOSIS — Z950 Presence of cardiac pacemaker: Secondary | ICD-10-CM | POA: Insufficient documentation

## 2018-03-03 DIAGNOSIS — I110 Hypertensive heart disease with heart failure: Secondary | ICD-10-CM | POA: Insufficient documentation

## 2018-03-03 DIAGNOSIS — Z96652 Presence of left artificial knee joint: Secondary | ICD-10-CM | POA: Diagnosis not present

## 2018-03-03 DIAGNOSIS — Z8249 Family history of ischemic heart disease and other diseases of the circulatory system: Secondary | ICD-10-CM | POA: Insufficient documentation

## 2018-03-03 DIAGNOSIS — E119 Type 2 diabetes mellitus without complications: Secondary | ICD-10-CM | POA: Diagnosis not present

## 2018-03-03 DIAGNOSIS — Z888 Allergy status to other drugs, medicaments and biological substances status: Secondary | ICD-10-CM | POA: Insufficient documentation

## 2018-03-03 DIAGNOSIS — Z79899 Other long term (current) drug therapy: Secondary | ICD-10-CM | POA: Diagnosis not present

## 2018-03-03 DIAGNOSIS — Z955 Presence of coronary angioplasty implant and graft: Secondary | ICD-10-CM | POA: Insufficient documentation

## 2018-03-03 DIAGNOSIS — Z853 Personal history of malignant neoplasm of breast: Secondary | ICD-10-CM | POA: Diagnosis not present

## 2018-03-03 MED ORDER — APIXABAN 5 MG PO TABS: 5.0000 mg | ORAL_TABLET | Freq: Two times a day (BID) | ORAL | 3 refills | Status: DC

## 2018-03-03 MED ORDER — APIXABAN 5 MG PO TABS: 5.0000 mg | ORAL_TABLET | Freq: Two times a day (BID) | ORAL | 0 refills | Status: DC

## 2018-03-03 NOTE — Patient Instructions (Addendum)
Stop Brilinta Stop aspirin  Start Eliquis 5mg  twice a day

## 2018-03-04 NOTE — Progress Notes (Signed)
Primary Care Physician: Seward Carol, MD Referring Physician: Dr. Ileana Ladd Cardiologist: Dr. Richardo Priest Danielle [name] is a 72 y.o. female with a h/o CAD, s/p 5 stents in the [place] area years ago, acute combined systolic and diastolic CHF, s/p breast cancer, DM, HTN, OSA, hypothyroidism, PPM that is in the afib clinic for recent afib noted on 10/2 that lasted 11 hours. Pt was unaware. She was referred by [facility] for consideration for start of anticoagulation. She is currently on ASA and Brilenta for her CAD. She drinks minimal alcohol, and caffeine, no tobacco. Wears cpap.  Today, she denies symptoms of palpitations, chest pain, shortness of breath, orthopnea, PND, lower extremity edema, dizziness, presyncope, syncope, or neurologic sequela. The patient is tolerating medications without difficulties and is otherwise without complaint today.   Past Medical History:  Diagnosis Date  . Acute combined systolic and diastolic congestive heart failure ([place]) 02/05/2017  . Arthritis   . Blood dyscrasia    per pt-has small blood cells-appears as if anemic  . Breast cancer ([place])   . Cancer (HCC)    breast  . CHF (congestive heart failure) ([place])   . Complication of anesthesia    difficult to awaken from per pt  . Diabetes mellitus without complication ([place])   . Dyspnea   . Essential hypertension   . Heart disease   . Hypothyroidism   . Obesity (BMI 30-39.9) 11/11/2017  . OSA (obstructive sleep apnea) 02/18/2017    severe obstructive sleep apnea with an AHI of 75.6/h and mild central sleep apnea with a CAI of 7.7/h.  Oxygen saturations dropped as low as 82%.   He is on CPAP at 9 cm H2O.  . Personal history of chemotherapy   . Personal history of radiation therapy   . Presence of permanent cardiac pacemaker   . Sleep apnea   . Thyroid disease    hypothyroid   Past Surgical History:  Procedure Laterality Date  . BREAST BIOPSY    . BREAST LUMPECTOMY Left    1985  . BREAST  LUMPECTOMY WITH NEEDLE LOCALIZATION AND AXILLARY LYMPH NODE DISSECTION  1985  . BREAST SURGERY Left    partial mastectomy  . CORONARY STENT PLACEMENT  2010, 2012,2017    2 done 2010 and 1 replaced 2012 and 2 replaced in 2017  . EYE SURGERY Bilateral    bilateral lens implant  . INSERT / REPLACE / REMOVE PACEMAKER  07/19/2014  . REPLACEMENT TOTAL KNEE Left 09/2012  . THYROIDECTOMY, PARTIAL  1978  . TONSILLECTOMY      Current Outpatient Medications  Medication Sig Dispense Refill  . acetaminophen (TYLENOL) 650 MG CR tablet Take 650 mg by mouth every 8 (eight) hours as needed for pain.    Marland Kitchen allopurinol (ZYLOPRIM) 100 MG tablet Take 1 tablet (100 mg total) by mouth daily.    Marland Kitchen atorvastatin (LIPITOR) 80 MG tablet Take 1 tablet (80 mg total) by mouth at bedtime. 30 tablet 11  . Biotin 10 MG CAPS Take 10 mg by mouth daily.    . calcium citrate (CALCITRATE - DOSED IN MG ELEMENTAL CALCIUM) 950 MG tablet Take 1 tablet by mouth 2 (two) times daily.     . carvedilol (COREG) 25 MG tablet Take 1 tablet (25 mg total) by mouth 2 (two) times daily. 180 tablet 3  . CHONDROITIN SULFATE PO Take 250 mg by mouth daily.    . empagliflozin (JARDIANCE) 10 MG TABS tablet Take 10 mg by mouth daily.  30 tablet 11  . ferrous sulfate 325 (65 FE) MG tablet Take 325 mg by mouth 2 (two) times daily with a meal.    . insulin glargine (LANTUS) 100 UNIT/ML injection Inject 30 Units into the skin 2 (two) times daily.     . isosorbide mononitrate (IMDUR) 30 MG 24 hr tablet Take 1 tablet (30 mg total) by mouth daily. 30 tablet 3  . Lactobacillus (ACIDOPHILUS PO) Take 1 capsule by mouth daily.    Marland Kitchen levothyroxine (SYNTHROID, LEVOTHROID) 75 MCG tablet Take 75 mcg by mouth daily before breakfast.    . losartan (COZAAR) 50 MG tablet Take 1 tablet (50 mg total) by mouth daily. 30 tablet 3  . metFORMIN (GLUCOPHAGE) 500 MG tablet Take 500 mg by mouth 2 (two) times daily with a meal.     . Multiple Vitamins-Minerals (ALIVE ONCE DAILY  WOMENS 50+ PO) Take 1 tablet by mouth daily.    Marland Kitchen omeprazole (PRILOSEC) 20 MG capsule Take 20 mg by mouth daily.     . potassium chloride (K-DUR,KLOR-CON) 10 MEQ tablet Take 2 tablets (20 mEq total) by mouth daily. 60 tablet 5  . spironolactone (ALDACTONE) 25 MG tablet Take 1 tablet (25 mg total) by mouth every other day. 15 tablet 5  . torsemide (DEMADEX) 20 MG tablet Take 1 tablet (20 mg total) by mouth 2 (two) times daily. 60 tablet 11  . Travoprost, BAK Free, (TRAVATAN) 0.004 % SOLN ophthalmic solution Place 1 drop into both eyes daily.    Marland Kitchen apixaban (ELIQUIS) 5 MG TABS tablet Take 1 tablet (5 mg total) by mouth 2 (two) times daily. 60 tablet 3  . insulin lispro (HUMALOG) 100 UNIT/ML injection Inject 5 Units into the skin daily with lunch.     . nitroGLYCERIN (NITROSTAT) 0.4 MG SL tablet DISSOLVE ONE TABLET UNDER THE TONGUE EVERY 5 MINUTES AS NEEDED FOR CHEST PAIN. (Patient not taking: Reported on 03/03/2018) 25 tablet 3   No current facility-administered medications for this encounter.     Allergies  Allergen Reactions  . Sulfa Antibiotics Rash  . Uloric [Febuxostat] Rash    Social History   Socioeconomic History  . Marital status: Married    Spouse name: Not on file  . Number of children: Not on file  . Years of education: Not on file  . Highest education level: Not on file  Occupational History  . Not on file  Social Needs  . Financial resource strain: Not on file  . Food insecurity:    Worry: Not on file    Inability: Not on file  . Transportation needs:    Medical: Not on file    Non-medical: Not on file  Tobacco Use  . Smoking status: Former Smoker    Packs/day: 1.00    Years: 22.00    Pack years: 22.00    Types: Cigarettes    Last attempt to quit: 1983    Years since quitting: 36.7  . Smokeless tobacco: Never Used  Substance and Sexual Activity  . Alcohol use: Yes    Comment: ocassional  . Drug use: No  . Sexual activity: Never    Birth control/protection:  None, Post-menopausal  Lifestyle  . Physical activity:    Days per week: Not on file    Minutes per session: Not on file  . Stress: Not on file  Relationships  . Social connections:    Talks on phone: Not on file    Gets together: Not on file  Attends religious service: Not on file    Active member of club or organization: Not on file    Attends meetings of clubs or organizations: Not on file    Relationship status: Not on file  . Intimate partner violence:    Fear of current or ex partner: Not on file    Emotionally abused: Not on file    Physically abused: Not on file    Forced sexual activity: Not on file  Other Topics Concern  . Not on file  Social History Narrative  . Not on file    Family History  Problem Relation Age of Onset  . Multiple myeloma Mother   . Heart disease Father   . Other Sister   . Heart disease Brother   . Diabetes Sister   . Other Sister     ROS- All systems are reviewed and negative except as per the HPI above  Physical Exam: Vitals:   03/03/18 1517  BP: (!) 96/54  Pulse: 72  Weight: 93.9 kg  Height: 5' 4"  (1.626 m)   Wt Readings from Last 3 Encounters:  03/03/18 93.9 kg  02/27/18 91.6 kg  02/20/18 93 kg    Labs: Lab Results  Component Value Date   NA 139 11/29/2017   K 4.3 11/29/2017   CL 103 11/29/2017   CO2 28 11/29/2017   GLUCOSE 197 (H) 11/29/2017   BUN 30 (H) 11/29/2017   CREATININE 1.47 (H) 11/29/2017   CALCIUM 9.0 11/29/2017   No results found for: INR Lab Results  Component Value Date   CHOL 87 03/06/2017   HDL 38 (L) 03/06/2017   LDLCALC 24 03/06/2017   TRIG 123 03/06/2017     GEN- The patient is well appearing, alert and oriented x 3 today.   Head- normocephalic, atraumatic Eyes-  Sclera clear, conjunctiva pink Ears- hearing intact Oropharynx- clear Neck- supple, no JVP Lymph- no cervical lymphadenopathy Lungs- Clear to ausculation bilaterally, normal work of breathing Heart- Regular rate and  rhythm, no murmurs, rubs or gallops, PMI not laterally displaced GI- soft, NT, ND, + BS Extremities- no clubbing, cyanosis, or edema MS- no significant deformity or atrophy Skin- no rash or lesion Psych- euthymic mood, full affect Neuro- strength and sensation are intact  EKG-  NSR at 72 bpm  Paceart report reviewed showing afib    Assessment and Plan: 1. New onset afib 11 hour duration 10/2 SR today  Pt was asymptomatic Continue carvedilol for rate control   2. CHA2DS2VASc score of 5 Spoke to Dr. Mahalia Longest and will stop asa and brilenta  No bleeding history  She will started on eliquis 5 mg bid  Free 30 day supply given Bleeding precautions discussed   I will see back in 3 weeks will cbc/bmet  Danielle [name], [facility] [address] [place], [place] [address] [phone]

## 2018-03-05 ENCOUNTER — Telehealth (HOSPITAL_COMMUNITY): Payer: Self-pay

## 2018-03-05 DIAGNOSIS — H401134 Primary open-angle glaucoma, bilateral, indeterminate stage: Secondary | ICD-10-CM | POA: Diagnosis not present

## 2018-03-06 ENCOUNTER — Other Ambulatory Visit (HOSPITAL_COMMUNITY): Payer: Self-pay

## 2018-03-06 NOTE — Progress Notes (Signed)
Paramedicine Encounter    Patient ID: Danielle [name], female    DOB: 1946/05/16, 72 y.o.   MRN: [id number]   Patient Care Team: Danielle Carol, MD as PCP - General (Internal Medicine)  Patient Active Problem List   Diagnosis Date Noted  . (HFpEF) heart failure with preserved ejection fraction ([place]) 12/20/2017  . Obesity (BMI 30-39.9) 11/11/2017  . OSA (obstructive sleep apnea) 02/18/2017  . Essential hypertension   . Diabetes mellitus without complication ([place])   . Cancer ([place])   . Thyroid disease     Current Outpatient Medications:  .  acetaminophen (TYLENOL) 650 MG CR tablet, Take 650 mg by mouth every 8 (eight) hours as needed for pain., Disp: , Rfl:  .  allopurinol (ZYLOPRIM) 100 MG tablet, Take 1 tablet (100 mg total) by mouth daily., Disp: , Rfl:  .  apixaban (ELIQUIS) 5 MG TABS tablet, Take 1 tablet (5 mg total) by mouth 2 (two) times daily., Disp: 60 tablet, Rfl: 3 .  atorvastatin (LIPITOR) 80 MG tablet, Take 1 tablet (80 mg total) by mouth at bedtime., Disp: 30 tablet, Rfl: 11 .  Biotin 10 MG CAPS, Take 10 mg by mouth daily., Disp: , Rfl:  .  carvedilol (COREG) 25 MG tablet, Take 1 tablet (25 mg total) by mouth 2 (two) times daily., Disp: 180 tablet, Rfl: 3 .  CHONDROITIN SULFATE PO, Take 250 mg by mouth daily., Disp: , Rfl:  .  empagliflozin (JARDIANCE) 10 MG TABS tablet, Take 10 mg by mouth daily., Disp: 30 tablet, Rfl: 11 .  ferrous sulfate 325 (65 FE) MG tablet, Take 325 mg by mouth 2 (two) times daily with a meal., Disp: , Rfl:  .  insulin glargine (LANTUS) 100 UNIT/ML injection, Inject 30 Units into the skin 2 (two) times daily. , Disp: , Rfl:  .  insulin lispro (HUMALOG) 100 UNIT/ML injection, Inject 5 Units into the skin daily with lunch. , Disp: , Rfl:  .  isosorbide mononitrate (IMDUR) 30 MG 24 hr tablet, Take 1 tablet (30 mg total) by mouth daily., Disp: 30 tablet, Rfl: 3 .  Lactobacillus (ACIDOPHILUS PO), Take 1 capsule by mouth daily., Disp: , Rfl:  .   levothyroxine (SYNTHROID, LEVOTHROID) 75 MCG tablet, Take 75 mcg by mouth daily before breakfast., Disp: , Rfl:  .  losartan (COZAAR) 50 MG tablet, Take 1 tablet (50 mg total) by mouth daily., Disp: 30 tablet, Rfl: 3 .  metFORMIN (GLUCOPHAGE) 500 MG tablet, Take 500 mg by mouth 2 (two) times daily with a meal. , Disp: , Rfl:  .  Multiple Vitamins-Minerals (ALIVE ONCE DAILY WOMENS 50+ PO), Take 1 tablet by mouth daily., Disp: , Rfl:  .  omeprazole (PRILOSEC) 20 MG capsule, Take 20 mg by mouth daily. , Disp: , Rfl:  .  potassium chloride (K-DUR,KLOR-CON) 10 MEQ tablet, Take 2 tablets (20 mEq total) by mouth daily., Disp: 60 tablet, Rfl: 5 .  spironolactone (ALDACTONE) 25 MG tablet, Take 1 tablet (25 mg total) by mouth every other day., Disp: 15 tablet, Rfl: 5 .  torsemide (DEMADEX) 20 MG tablet, Take 1 tablet (20 mg total) by mouth 2 (two) times daily., Disp: 60 tablet, Rfl: 11 .  Travoprost, BAK Free, (TRAVATAN) 0.004 % SOLN ophthalmic solution, Place 1 drop into both eyes daily., Disp: , Rfl:  .  calcium citrate (CALCITRATE - DOSED IN MG ELEMENTAL CALCIUM) 950 MG tablet, Take 1 tablet by mouth 2 (two) times daily. , Disp: , Rfl:  .  nitroGLYCERIN (  NITROSTAT) 0.4 MG SL tablet, DISSOLVE ONE TABLET UNDER THE TONGUE EVERY 5 MINUTES AS NEEDED FOR CHEST PAIN. (Patient not taking: Reported on 03/03/2018), Disp: 25 tablet, Rfl: 3 Allergies  Allergen Reactions  . Sulfa Antibiotics Rash  . Uloric [Febuxostat] Rash      Social History   Socioeconomic History  . Marital status: Married    Spouse name: Not on file  . Number of children: Not on file  . Years of education: Not on file  . Highest education level: Not on file  Occupational History  . Not on file  Social [name]  . Financial resource strain: Not on file  . Food insecurity:    Worry: Not on file    Inability: Not on file  . Transportation [name]:    Medical: Not on file    Non-medical: Not on file  Tobacco Use  . Smoking status:  Former Smoker    Packs/day: 1.00    Years: 22.00    Pack years: 22.00    Types: Cigarettes    Last attempt to quit: 1983    Years since quitting: 36.7  . Smokeless tobacco: Never Used  Substance and Sexual Activity  . Alcohol use: Yes    Comment: ocassional  . Drug use: No  . Sexual activity: Never    Birth control/protection: None, Post-menopausal  Lifestyle  . Physical activity:    Days per week: Not on file    Minutes per session: Not on file  . Stress: Not on file  Relationships  . Social connections:    Talks on phone: Not on file    Gets together: Not on file    Attends religious service: Not on file    Active member of club or organization: Not on file    Attends meetings of clubs or organizations: Not on file    Relationship status: Not on file  . Intimate partner violence:    Fear of current or ex partner: Not on file    Emotionally abused: Not on file    Physically abused: Not on file    Forced sexual activity: Not on file  Other Topics Concern  . Not on file  Social History Narrative  . Not on file    Physical Exam  Constitutional: She is oriented to person, place, and time.  Cardiovascular: Normal rate and regular rhythm.  Pulmonary/Chest: Effort normal and breath sounds normal.  Abdominal: Soft.  Musculoskeletal: Normal range of motion. She exhibits edema.  Neurological: She is alert and oriented to person, place, and time.  Skin: Skin is warm and dry.  Psychiatric: She has a normal mood and affect.        Future Appointments  Date Time Provider [place]  03/13/2018 11:00 AM [facility] PA/NP [facility] None  04/03/2018 11:30 AM Danielle Needs, NP [facility] None  05/20/2018  8:10 AM [facility] DEVICE REMOTES [facility]  05/30/2018 12:00 PM Danielle Board, MD [facility]    BP 122/62 (BP Location: Right Arm, Patient Position: Sitting, Cuff Size: Large)   Pulse 68   Resp 16   Wt 206 lb (93.4 kg)   SpO2 95%   BMI  35.36 kg/m   Weight yesterday- 204 lb Last visit weight- 202 lb  Danielle [name] was seen at home today and reported feeling well. She denied SOB, headache, dizziness or orthopnea. She stated she has been compliant with her medications and her weights have been stable. Her medications were verified and  Her  pillbox was refilled.   [name], EMT 03/06/18  ACTION: Home visit completed Next visit planned for 1 week

## 2018-03-06 NOTE — Telephone Encounter (Signed)
I called Danielle [name] to see if she wouold be available for a meeting tomorrow. She advised that she would be and we agreed to meet at 11:30.

## 2018-03-12 NOTE — Progress Notes (Signed)
Advanced Heart Failure Clinic Note    Primary Care: Dr. Delfina Redwood Primary Cardiologist: Dr. Haroldine Laws  EP: Dr Tomasa Blase  HPI:  Danielle [name] is a 72 y.o. female with a past medical history of systolic CHF (EF 65-53%), CAD, ischemic cardiomyopathy, HTN, DM, hypothyroidism, and OSA sleep study 03/2017.   Admitted 02/03/17-02/06/17 with SOB, acute on chronic systolic CHF. Diuresed with IV lasix (12 pounds), discharge weight was 215 pounds.   She returns today for HF follow up. Last visit, started on jardiance and told to take extra torsemide x2 days. She had afib noted on device interrogation and has been seen in AF clinic. Brilinta and ASA were stopped and she was started on Eliquis. Overall doing well. Denies bleeding on Eliquis. No palpitations. No SOB. No CP. She is more limited by fatigue and knee pain. She still has a little BLE edema. Chronic orthopnea. Wears CPAP qHS. No CP or dizziness. Weight 202-204 lbs at home. Down 5 lbs on our scale. Limiting salt intake. Drinks about 1.5 L/day. Followed by HF paramedicine.   ECHO 01/2017- EF 45-50% Grade 2DD Left Atrium Severely dilated.  ECHO 12/2017 EF 45-50%, G2DD, LA mildly dilated, trileaflet moderately thick aortic valve Labs  11/29/17 k 4.3, cr 1.47   Review of systems complete and found to be negative unless listed in HPI.    Past Medical History:  Diagnosis Date  . Acute combined systolic and diastolic congestive heart failure ([place]) 02/05/2017  . Arthritis   . Blood dyscrasia    per pt-has small blood cells-appears as if anemic  . Breast cancer ([place])   . Cancer (HCC)    breast  . CHF (congestive heart failure) ([place])   . Complication of anesthesia    difficult to awaken from per pt  . Diabetes mellitus without complication ([place])   . Dyspnea   . Essential hypertension   . Heart disease   . Hypothyroidism   . Obesity (BMI 30-39.9) 11/11/2017  . OSA (obstructive sleep apnea) 02/18/2017    severe obstructive sleep apnea with an AHI of  75.6/h and mild central sleep apnea with a CAI of 7.7/h.  Oxygen saturations dropped as low as 82%.   He is on CPAP at 9 cm H2O.  . Personal history of chemotherapy   . Personal history of radiation therapy   . Presence of permanent cardiac pacemaker   . Sleep apnea   . Thyroid disease    hypothyroid    Current Outpatient Medications  Medication Sig Dispense Refill  . acetaminophen (TYLENOL) 650 MG CR tablet Take 650 mg by mouth every 8 (eight) hours as needed for pain.    Marland Kitchen allopurinol (ZYLOPRIM) 100 MG tablet Take 1 tablet (100 mg total) by mouth daily.    Marland Kitchen apixaban (ELIQUIS) 5 MG TABS tablet Take 1 tablet (5 mg total) by mouth 2 (two) times daily. 60 tablet 3  . atorvastatin (LIPITOR) 80 MG tablet Take 1 tablet (80 mg total) by mouth at bedtime. 30 tablet 11  . Biotin 10 MG CAPS Take 10 mg by mouth daily.    . calcium citrate (CALCITRATE - DOSED IN MG ELEMENTAL CALCIUM) 950 MG tablet Take 1 tablet by mouth 2 (two) times daily.     . carvedilol (COREG) 25 MG tablet Take 1 tablet (25 mg total) by mouth 2 (two) times daily. 180 tablet 3  . celecoxib (CELEBREX) 200 MG capsule Take 200 mg by mouth daily.    . CHONDROITIN SULFATE PO Take 250 mg  by mouth daily.    . empagliflozin (JARDIANCE) 10 MG TABS tablet Take 10 mg by mouth daily. 30 tablet 11  . ferrous sulfate 325 (65 FE) MG tablet Take 325 mg by mouth 2 (two) times daily with a meal.    . gabapentin (NEURONTIN) 300 MG capsule Take 300 mg by mouth 3 (three) times daily.    . insulin glargine (LANTUS) 100 UNIT/ML injection Inject 30 Units into the skin 2 (two) times daily.     . insulin lispro (HUMALOG) 100 UNIT/ML injection Inject 5 Units into the skin daily with lunch.     . isosorbide mononitrate (IMDUR) 30 MG 24 hr tablet Take 1 tablet (30 mg total) by mouth daily. 30 tablet 3  . Lactobacillus (ACIDOPHILUS PO) Take 1 capsule by mouth daily.    Marland Kitchen levothyroxine (SYNTHROID, LEVOTHROID) 75 MCG tablet Take 75 mcg by mouth daily before  breakfast.    . losartan (COZAAR) 50 MG tablet Take 1 tablet (50 mg total) by mouth daily. 30 tablet 3  . metFORMIN (GLUCOPHAGE) 500 MG tablet Take 500 mg by mouth 2 (two) times daily with a meal.     . Multiple Vitamins-Minerals (ALIVE ONCE DAILY WOMENS 50+ PO) Take 1 tablet by mouth daily.    . nitroGLYCERIN (NITROSTAT) 0.4 MG SL tablet DISSOLVE ONE TABLET UNDER THE TONGUE EVERY 5 MINUTES AS NEEDED FOR CHEST PAIN. (Patient not taking: Reported on 03/13/2018) 25 tablet 3  . omeprazole (PRILOSEC) 20 MG capsule Take 20 mg by mouth daily.     . potassium chloride (K-DUR,KLOR-CON) 10 MEQ tablet Take 2 tablets (20 mEq total) by mouth daily. 60 tablet 5  . spironolactone (ALDACTONE) 25 MG tablet Take 1 tablet (25 mg total) by mouth every other day. 15 tablet 5  . torsemide (DEMADEX) 20 MG tablet Take 1 tablet (20 mg total) by mouth 2 (two) times daily. 60 tablet 11  . Travoprost, BAK Free, (TRAVATAN) 0.004 % SOLN ophthalmic solution Place 1 drop into both eyes daily.     No current facility-administered medications for this encounter.    Allergies  Allergen Reactions  . Sulfa Antibiotics Rash  . Uloric [Febuxostat] Rash   Social History   Socioeconomic History  . Marital status: Married    Spouse name: Not on file  . Number of children: Not on file  . Years of education: Not on file  . Highest education level: Not on file  Occupational History  . Not on file  Social Needs  . Financial resource strain: Not on file  . Food insecurity:    Worry: Not on file    Inability: Not on file  . Transportation needs:    Medical: Not on file    Non-medical: Not on file  Tobacco Use  . Smoking status: Former Smoker    Packs/day: 1.00    Years: 22.00    Pack years: 22.00    Types: Cigarettes    Last attempt to quit: 1983    Years since quitting: 36.8  . Smokeless tobacco: Never Used  Substance and Sexual Activity  . Alcohol use: Yes    Comment: ocassional  . Drug use: No  . Sexual  activity: Never    Birth control/protection: None, Post-menopausal  Lifestyle  . Physical activity:    Days per week: Not on file    Minutes per session: Not on file  . Stress: Not on file  Relationships  . Social connections:    Talks on phone: Not  on file    Gets together: Not on file    Attends religious service: Not on file    Active member of club or organization: Not on file    Attends meetings of clubs or organizations: Not on file    Relationship status: Not on file  . Intimate partner violence:    Fear of current or ex partner: Not on file    Emotionally abused: Not on file    Physically abused: Not on file    Forced sexual activity: Not on file  Other Topics Concern  . Not on file  Social History Narrative  . Not on file   Family History  Problem Relation Age of Onset  . Multiple myeloma Mother   . Heart disease Father   . Other Sister   . Heart disease Brother   . Diabetes Sister   . Other Sister    Vitals:   03/13/18 1122  BP: 136/70  Pulse: 72  SpO2: 97%  Weight: 94.5 kg (208 lb 6 oz)     Wt Readings from Last 3 Encounters:  03/13/18 94.5 kg (208 lb 6 oz)  03/06/18 93.4 kg (206 lb)  03/03/18 93.9 kg (207 lb)    PHYSICAL EXAM: General: Well appearing. No resp difficulty. HEENT: Normal Neck: Supple. JVP 5-6. Carotids 2+ bilat; no bruits. No thyromegaly or nodule noted. Cor: PMI nondisplaced. RRR, 2/6 TR  Lungs: CTAB, normal effort. Abdomen: Obese Soft, non-tender, non-distended, no HSM. No bruits or masses. +BS  Extremities: No cyanosis, clubbing, or rash. BLE 1+ ankle edema Neuro: Alert & orientedx3, cranial nerves grossly intact. moves all 4 extremities w/o difficulty. Affect pleasant    ASSESSMENT & PLAN:  1. Chronic systolic CHF: ICM, Echo 11/3417 EF 45-50%, G2DD, LA mildly dilated, trileaflet moderately thick aortic valve - NYHA II symptoms, confounded by joint pain - Volume status stable on exam. - Continue torsemide 77m BID. BMET  today.  - Continue imdur 30 mg daily - Continue losartan 50 mg daily. - Continue 25 mg spironolactone daily.  - Continue Coreg 25 mg twice a day. - Continue jardiance 10 mg daily.  - Reinforced fluid restriction to < 2 L daily, sodium restriction to less than 2000 mg daily, and the importance of daily weights.   - Continue HF paramedicine.  2. CAD: [place] 2010, 2012 and 2016. [place] - No s/s ischemia - Continue statin. No ASA with Eliquis.  3. Afib (new, as of 02/26/18) - Continue Eliquis 5 mg BID. Denies bleeding.  - Following in Afib clinic.  - No afib on device interrogation. Regular on exam.   4. HTN - Stable today.   5. DM - Per Dr. PDelfina Redwood - Continue jardiance.   6. PPM - Dr. CCurt Bearsnow following. No change.  7. Gout - Per PCP.  - Has follow up with Rheumatology   8. Arthritis - Continue tylenol. Per PCP.   9. OSA-  - Reports using her cpap nightly. No change. - Follows with Dr TRadford Pax  BMET today Follow up in 2 months  AGeorgiana Shore NP 03/13/18   Greater than 50% of the 25 minute visit was spent in counseling/coordination of care regarding disease state education, salt/fluid restriction, sliding scale diuretics, and medication compliance.

## 2018-03-13 ENCOUNTER — Other Ambulatory Visit: Payer: Self-pay

## 2018-03-13 ENCOUNTER — Telehealth (HOSPITAL_COMMUNITY): Payer: Self-pay

## 2018-03-13 ENCOUNTER — Other Ambulatory Visit (HOSPITAL_COMMUNITY): Payer: Self-pay

## 2018-03-13 ENCOUNTER — Ambulatory Visit (HOSPITAL_COMMUNITY)
Admission: RE | Admit: 2018-03-13 | Discharge: 2018-03-13 | Disposition: A | Discharge status: Home / Self Care | Payer: Medicare HMO | Source: Ambulatory Visit | Attending: Cardiology | Admitting: Cardiology

## 2018-03-13 ENCOUNTER — Encounter (HOSPITAL_COMMUNITY): Payer: Self-pay

## 2018-03-13 VITALS — BP 136/70 | HR 72 | Wt 208.4 lb

## 2018-03-13 DIAGNOSIS — E039 Hypothyroidism, unspecified: Secondary | ICD-10-CM | POA: Insufficient documentation

## 2018-03-13 DIAGNOSIS — E669 Obesity, unspecified: Secondary | ICD-10-CM | POA: Insufficient documentation

## 2018-03-13 DIAGNOSIS — I5022 Chronic systolic (congestive) heart failure: Secondary | ICD-10-CM | POA: Insufficient documentation

## 2018-03-13 DIAGNOSIS — I10 Essential (primary) hypertension: Secondary | ICD-10-CM | POA: Diagnosis not present

## 2018-03-13 DIAGNOSIS — I4891 Unspecified atrial fibrillation: Secondary | ICD-10-CM | POA: Diagnosis not present

## 2018-03-13 DIAGNOSIS — E118 Type 2 diabetes mellitus with unspecified complications: Secondary | ICD-10-CM | POA: Diagnosis not present

## 2018-03-13 DIAGNOSIS — E119 Type 2 diabetes mellitus without complications: Secondary | ICD-10-CM | POA: Diagnosis not present

## 2018-03-13 DIAGNOSIS — Z7901 Long term (current) use of anticoagulants: Secondary | ICD-10-CM | POA: Insufficient documentation

## 2018-03-13 DIAGNOSIS — G4733 Obstructive sleep apnea (adult) (pediatric): Secondary | ICD-10-CM

## 2018-03-13 DIAGNOSIS — Z8249 Family history of ischemic heart disease and other diseases of the circulatory system: Secondary | ICD-10-CM | POA: Insufficient documentation

## 2018-03-13 DIAGNOSIS — I110 Hypertensive heart disease with heart failure: Secondary | ICD-10-CM | POA: Insufficient documentation

## 2018-03-13 DIAGNOSIS — Z794 Long term (current) use of insulin: Secondary | ICD-10-CM

## 2018-03-13 DIAGNOSIS — Z833 Family history of diabetes mellitus: Secondary | ICD-10-CM | POA: Insufficient documentation

## 2018-03-13 DIAGNOSIS — Z950 Presence of cardiac pacemaker: Secondary | ICD-10-CM | POA: Insufficient documentation

## 2018-03-13 DIAGNOSIS — Z882 Allergy status to sulfonamides status: Secondary | ICD-10-CM | POA: Insufficient documentation

## 2018-03-13 DIAGNOSIS — M1990 Unspecified osteoarthritis, unspecified site: Secondary | ICD-10-CM | POA: Diagnosis not present

## 2018-03-13 DIAGNOSIS — Z87891 Personal history of nicotine dependence: Secondary | ICD-10-CM | POA: Insufficient documentation

## 2018-03-13 DIAGNOSIS — Z79899 Other long term (current) drug therapy: Secondary | ICD-10-CM | POA: Diagnosis not present

## 2018-03-13 DIAGNOSIS — I2510 Atherosclerotic heart disease of native coronary artery without angina pectoris: Secondary | ICD-10-CM | POA: Diagnosis not present

## 2018-03-13 DIAGNOSIS — I5042 Chronic combined systolic (congestive) and diastolic (congestive) heart failure: Secondary | ICD-10-CM | POA: Diagnosis not present

## 2018-03-13 DIAGNOSIS — M109 Gout, unspecified: Secondary | ICD-10-CM | POA: Insufficient documentation

## 2018-03-13 DIAGNOSIS — Z79890 Hormone replacement therapy (postmenopausal): Secondary | ICD-10-CM | POA: Insufficient documentation

## 2018-03-13 DIAGNOSIS — I5020 Unspecified systolic (congestive) heart failure: Secondary | ICD-10-CM | POA: Diagnosis present

## 2018-03-13 DIAGNOSIS — Z923 Personal history of irradiation: Secondary | ICD-10-CM | POA: Insufficient documentation

## 2018-03-13 DIAGNOSIS — I255 Ischemic cardiomyopathy: Secondary | ICD-10-CM | POA: Insufficient documentation

## 2018-03-13 DIAGNOSIS — I480 Paroxysmal atrial fibrillation: Secondary | ICD-10-CM | POA: Diagnosis not present

## 2018-03-13 DIAGNOSIS — Z6830 Body mass index (BMI) 30.0-30.9, adult: Secondary | ICD-10-CM | POA: Diagnosis not present

## 2018-03-13 DIAGNOSIS — Z9221 Personal history of antineoplastic chemotherapy: Secondary | ICD-10-CM | POA: Insufficient documentation

## 2018-03-13 LAB — BASIC METABOLIC PANEL
Anion gap: 13 (ref 5–15)
BUN: 34 mg/dL — AB (ref 8–23)
CALCIUM: 9.6 mg/dL (ref 8.9–10.3)
CO2: 27 mmol/L (ref 22–32)
CREATININE: 1.64 mg/dL — AB (ref 0.44–1.00)
Chloride: 99 mmol/L (ref 98–111)
GFR calc Af Amer: 35 mL/min — ABNORMAL LOW (ref >60)
GFR, EST NON AFRICAN AMERICAN: 30 mL/min — AB (ref >60)
GLUCOSE: 250 mg/dL — AB (ref 70–99)
Potassium: 4.8 mmol/L (ref 3.5–5.1)
Sodium: 139 mmol/L (ref 135–145)

## 2018-03-13 NOTE — Progress Notes (Signed)
Paramedicine Encounter   Patient ID: Danielle [name] , female,   DOB: [birth year],72 y.o.,  MRN: [id number]  Danielle [name] was seen in the HF clinic today and reported feeling generally well. Her only complaint is difficulty sleeping and dizziness from "stuffy ears." Her medications were verified and her pillbox was refilled. Per Lillia Mountain, NP, no changes are necessary today.   [name], EMT 03/13/2018   ACTION: Next visit planned for 1 week

## 2018-03-13 NOTE — Patient Instructions (Addendum)
Routine lab work today. Will notify you of abnormal results  Follow up in 8 weeks Wednesday Dec.11th at 10:00am GATE CODE: 1900

## 2018-03-13 NOTE — Telephone Encounter (Signed)
I called Danielle [name] to remind her of her clinic appointment and ask her to bring her medications and pillbox so I could handle them while she was at the clinic. She was agreeable.

## 2018-03-18 DIAGNOSIS — M25551 Pain in right hip: Secondary | ICD-10-CM | POA: Diagnosis not present

## 2018-03-18 DIAGNOSIS — M1711 Unilateral primary osteoarthritis, right knee: Secondary | ICD-10-CM | POA: Diagnosis not present

## 2018-03-19 ENCOUNTER — Telehealth (HOSPITAL_COMMUNITY): Payer: Self-pay

## 2018-03-19 ENCOUNTER — Ambulatory Visit: Payer: Medicare HMO

## 2018-03-19 ENCOUNTER — Other Ambulatory Visit (HOSPITAL_COMMUNITY): Payer: Self-pay

## 2018-03-19 NOTE — Progress Notes (Signed)
Paramedicine Encounter    Patient ID: Danielle [name], female    DOB: [birth year], 72 y.o.   MRN: [id number]   Patient Care Team: Seward Carol, MD as PCP - General (Internal Medicine)  Patient Active Problem List   Diagnosis Date Noted  . (HFpEF) heart failure with preserved ejection fraction ([place]) 12/20/2017  . Obesity (BMI 30-39.9) 11/11/2017  . OSA (obstructive sleep apnea) 02/18/2017  . Essential hypertension   . Diabetes mellitus without complication ([place])   . Cancer ([place])   . Thyroid disease     Current Outpatient Medications:  .  acetaminophen (TYLENOL) 650 MG CR tablet, Take 650 mg by mouth every 8 (eight) hours as needed for pain., Disp: , Rfl:  .  allopurinol (ZYLOPRIM) 100 MG tablet, Take 1 tablet (100 mg total) by mouth daily., Disp: , Rfl:  .  apixaban (ELIQUIS) 5 MG TABS tablet, Take 1 tablet (5 mg total) by mouth 2 (two) times daily., Disp: 60 tablet, Rfl: 3 .  atorvastatin (LIPITOR) 80 MG tablet, Take 1 tablet (80 mg total) by mouth at bedtime., Disp: 30 tablet, Rfl: 11 .  Biotin 10 MG CAPS, Take 10 mg by mouth daily., Disp: , Rfl:  .  carvedilol (COREG) 25 MG tablet, Take 1 tablet (25 mg total) by mouth 2 (two) times daily., Disp: 180 tablet, Rfl: 3 .  celecoxib (CELEBREX) 200 MG capsule, Take 200 mg by mouth daily., Disp: , Rfl:  .  CHONDROITIN SULFATE PO, Take 250 mg by mouth daily., Disp: , Rfl:  .  empagliflozin (JARDIANCE) 10 MG TABS tablet, Take 10 mg by mouth daily., Disp: 30 tablet, Rfl: 11 .  ferrous sulfate 325 (65 FE) MG tablet, Take 325 mg by mouth 2 (two) times daily with a meal., Disp: , Rfl:  .  gabapentin (NEURONTIN) 300 MG capsule, Take 300 mg by mouth 3 (three) times daily., Disp: , Rfl:  .  insulin glargine (LANTUS) 100 UNIT/ML injection, Inject 30 Units into the skin 2 (two) times daily. , Disp: , Rfl:  .  insulin lispro (HUMALOG) 100 UNIT/ML injection, Inject 5 Units into the skin daily with lunch. , Disp: , Rfl:  .  isosorbide mononitrate (IMDUR)  30 MG 24 hr tablet, Take 1 tablet (30 mg total) by mouth daily., Disp: 30 tablet, Rfl: 3 .  Lactobacillus (ACIDOPHILUS PO), Take 1 capsule by mouth daily., Disp: , Rfl:  .  levothyroxine (SYNTHROID, LEVOTHROID) 75 MCG tablet, Take 75 mcg by mouth daily before breakfast., Disp: , Rfl:  .  losartan (COZAAR) 50 MG tablet, Take 1 tablet (50 mg total) by mouth daily., Disp: 30 tablet, Rfl: 3 .  metFORMIN (GLUCOPHAGE) 500 MG tablet, Take 500 mg by mouth 2 (two) times daily with a meal. , Disp: , Rfl:  .  Multiple Vitamins-Minerals (ALIVE ONCE DAILY WOMENS 50+ PO), Take 1 tablet by mouth daily., Disp: , Rfl:  .  omeprazole (PRILOSEC) 20 MG capsule, Take 20 mg by mouth daily. , Disp: , Rfl:  .  potassium chloride (K-DUR,KLOR-CON) 10 MEQ tablet, Take 2 tablets (20 mEq total) by mouth daily., Disp: 60 tablet, Rfl: 5 .  spironolactone (ALDACTONE) 25 MG tablet, Take 1 tablet (25 mg total) by mouth every other day., Disp: 15 tablet, Rfl: 5 .  torsemide (DEMADEX) 20 MG tablet, Take 1 tablet (20 mg total) by mouth 2 (two) times daily., Disp: 60 tablet, Rfl: 11 .  Travoprost, BAK Free, (TRAVATAN) 0.004 % SOLN ophthalmic solution, Place 1 drop into both  eyes daily., Disp: , Rfl:  .  calcium citrate (CALCITRATE - DOSED IN MG ELEMENTAL CALCIUM) 950 MG tablet, Take 1 tablet by mouth 2 (two) times daily. , Disp: , Rfl:  .  nitroGLYCERIN (NITROSTAT) 0.4 MG SL tablet, DISSOLVE ONE TABLET UNDER THE TONGUE EVERY 5 MINUTES AS NEEDED FOR CHEST PAIN. (Patient not taking: Reported on 03/13/2018), Disp: 25 tablet, Rfl: 3 Allergies  Allergen Reactions  . Sulfa Antibiotics Rash  . Uloric [Febuxostat] Rash      Social History   Socioeconomic History  . Marital status: Married    Spouse name: Not on file  . Number of children: Not on file  . Years of education: Not on file  . Highest education level: Not on file  Occupational History  . Not on file  Social Needs  . Financial resource strain: Not on file  . Food  insecurity:    Worry: Not on file    Inability: Not on file  . Transportation needs:    Medical: Not on file    Non-medical: Not on file  Tobacco Use  . Smoking status: Former Smoker    Packs/day: 1.00    Years: 22.00    Pack years: 22.00    Types: Cigarettes    Last attempt to quit: 1983    Years since quitting: 36.8  . Smokeless tobacco: Never Used  Substance and Sexual Activity  . Alcohol use: Yes    Comment: ocassional  . Drug use: No  . Sexual activity: Never    Birth control/protection: None, Post-menopausal  Lifestyle  . Physical activity:    Days per week: Not on file    Minutes per session: Not on file  . Stress: Not on file  Relationships  . Social connections:    Talks on phone: Not on file    Gets together: Not on file    Attends religious service: Not on file    Active member of club or organization: Not on file    Attends meetings of clubs or organizations: Not on file    Relationship status: Not on file  . Intimate partner violence:    Fear of current or ex partner: Not on file    Emotionally abused: Not on file    Physically abused: Not on file    Forced sexual activity: Not on file  Other Topics Concern  . Not on file  Social History Narrative  . Not on file    Physical Exam  Constitutional: She is oriented to person, place, and time.  Cardiovascular: Normal rate and regular rhythm.  Pulmonary/Chest: Effort normal and breath sounds normal.  Abdominal: Soft.  Musculoskeletal: Normal range of motion. She exhibits edema.  Neurological: She is alert and oriented to person, place, and time.  Skin: Skin is warm and dry.  Psychiatric: She has a normal mood and affect.        Future Appointments  Date Time Provider [place]  04/03/2018 11:30 AM Sherran Needs, NP [facility] None  05/07/2018 10:00 AM [facility] PA/NP [facility] None  05/20/2018  8:10 AM [facility] DEVICE REMOTES [facility]  05/30/2018 12:00 PM Marzetta Board, MD [facility]    BP 106/60 (BP Location: Right Arm, Patient Position: Sitting, Cuff Size: Large)   Pulse 70   Resp 18   Wt 204 lb (92.5 kg)   SpO2 96%   BMI 35.02 kg/m   Weight yesterday- 204 lb Last visit weight- 208 lb  Danielle [name] was  seen at home today and reported feeling well. She denied SOB, headache, dizziness or orthopnea. She has bee compliant with her medications and her weight has been stable. She is frustrated that her weight is not dropping but I let her know that if she is not losing weight from the diuretic then she is likely dry which is a good thing. Her medications were verified and her pillbox was refilled. She was out of a few medications which were ready for pick up at the pharmacy. She was given written instruction with where to put the medications and told to call me if anything didn't make sense. She was understanding and agreeable.   Danielle [name], EMT 03/19/18  ACTION: Home visit completed Next visit planned for 1 week

## 2018-03-19 NOTE — Telephone Encounter (Signed)
I called Danielle [name] to see if she was available for an appointment this afternoon. She stated she was and we agreed to meet at 14:30.

## 2018-03-24 DIAGNOSIS — Z6837 Body mass index (BMI) 37.0-37.9, adult: Secondary | ICD-10-CM | POA: Diagnosis not present

## 2018-03-24 DIAGNOSIS — Z794 Long term (current) use of insulin: Secondary | ICD-10-CM | POA: Diagnosis not present

## 2018-03-24 DIAGNOSIS — G4730 Sleep apnea, unspecified: Secondary | ICD-10-CM | POA: Diagnosis not present

## 2018-03-24 DIAGNOSIS — I5022 Chronic systolic (congestive) heart failure: Secondary | ICD-10-CM | POA: Diagnosis not present

## 2018-03-24 DIAGNOSIS — E1140 Type 2 diabetes mellitus with diabetic neuropathy, unspecified: Secondary | ICD-10-CM | POA: Diagnosis not present

## 2018-03-24 DIAGNOSIS — I480 Paroxysmal atrial fibrillation: Secondary | ICD-10-CM | POA: Diagnosis not present

## 2018-03-24 DIAGNOSIS — I10 Essential (primary) hypertension: Secondary | ICD-10-CM | POA: Diagnosis not present

## 2018-03-24 DIAGNOSIS — I2510 Atherosclerotic heart disease of native coronary artery without angina pectoris: Secondary | ICD-10-CM | POA: Diagnosis not present

## 2018-03-24 DIAGNOSIS — E7800 Pure hypercholesterolemia, unspecified: Secondary | ICD-10-CM | POA: Diagnosis not present

## 2018-03-25 ENCOUNTER — Other Ambulatory Visit: Payer: Self-pay | Admitting: Internal Medicine

## 2018-03-26 DIAGNOSIS — M1711 Unilateral primary osteoarthritis, right knee: Secondary | ICD-10-CM | POA: Diagnosis not present

## 2018-03-28 ENCOUNTER — Telehealth (HOSPITAL_COMMUNITY): Payer: Self-pay

## 2018-03-28 ENCOUNTER — Other Ambulatory Visit (HOSPITAL_COMMUNITY): Payer: Self-pay

## 2018-03-28 DIAGNOSIS — G4733 Obstructive sleep apnea (adult) (pediatric): Secondary | ICD-10-CM | POA: Diagnosis not present

## 2018-03-28 NOTE — Telephone Encounter (Signed)
Danielle [name] called me to let me know she was on her way home and she would be there soon for me to come for our scheduled visit.

## 2018-03-28 NOTE — Telephone Encounter (Signed)
I returned a call from Danielle [name] this morning. I advised that I had an appointment this morning but would be able to come see her at 11:00 today. She stated that time would be fine for her.

## 2018-03-28 NOTE — Progress Notes (Signed)
Paramedicine Encounter    Patient ID: Danielle [name], female    DOB: Sep 29, 1945, 72 y.o.   MRN: [id number]   Patient Care Team: Seward Carol, MD as PCP - General (Internal Medicine)  Patient Active Problem List   Diagnosis Date Noted  . (HFpEF) heart failure with preserved ejection fraction ([place]) 12/20/2017  . Obesity (BMI 30-39.9) 11/11/2017  . OSA (obstructive sleep apnea) 02/18/2017  . Essential hypertension   . Diabetes mellitus without complication ([place])   . Cancer ([place])   . Thyroid disease     Current Outpatient Medications:  .  acetaminophen (TYLENOL) 650 MG CR tablet, Take 650 mg by mouth every 8 (eight) hours as needed for pain., Disp: , Rfl:  .  allopurinol (ZYLOPRIM) 100 MG tablet, Take 1 tablet (100 mg total) by mouth daily., Disp: , Rfl:  .  apixaban (ELIQUIS) 5 MG TABS tablet, Take 1 tablet (5 mg total) by mouth 2 (two) times daily., Disp: 60 tablet, Rfl: 3 .  atorvastatin (LIPITOR) 80 MG tablet, Take 1 tablet (80 mg total) by mouth at bedtime., Disp: 30 tablet, Rfl: 11 .  Biotin 10 MG CAPS, Take 10 mg by mouth daily., Disp: , Rfl:  .  carvedilol (COREG) 25 MG tablet, Take 1 tablet (25 mg total) by mouth 2 (two) times daily., Disp: 180 tablet, Rfl: 3 .  CHONDROITIN SULFATE PO, Take 250 mg by mouth daily., Disp: , Rfl:  .  empagliflozin (JARDIANCE) 10 MG TABS tablet, Take 10 mg by mouth daily., Disp: 30 tablet, Rfl: 11 .  ferrous sulfate 325 (65 FE) MG tablet, Take 325 mg by mouth 2 (two) times daily with a meal., Disp: , Rfl:  .  gabapentin (NEURONTIN) 300 MG capsule, Take 300 mg by mouth 3 (three) times daily., Disp: , Rfl:  .  insulin glargine (LANTUS) 100 UNIT/ML injection, Inject 30 Units into the skin 2 (two) times daily. , Disp: , Rfl:  .  insulin lispro (HUMALOG) 100 UNIT/ML injection, Inject 5 Units into the skin daily with lunch. , Disp: , Rfl:  .  isosorbide mononitrate (IMDUR) 30 MG 24 hr tablet, Take 1 tablet (30 mg total) by mouth daily., Disp: 30 tablet,  Rfl: 3 .  Lactobacillus (ACIDOPHILUS PO), Take 1 capsule by mouth daily., Disp: , Rfl:  .  levothyroxine (SYNTHROID, LEVOTHROID) 75 MCG tablet, Take 75 mcg by mouth daily before breakfast., Disp: , Rfl:  .  losartan (COZAAR) 50 MG tablet, Take 1 tablet (50 mg total) by mouth daily., Disp: 30 tablet, Rfl: 3 .  metFORMIN (GLUCOPHAGE) 500 MG tablet, Take 500 mg by mouth 2 (two) times daily with a meal. , Disp: , Rfl:  .  Multiple Vitamins-Minerals (ALIVE ONCE DAILY WOMENS 50+ PO), Take 1 tablet by mouth daily., Disp: , Rfl:  .  omeprazole (PRILOSEC) 20 MG capsule, Take 20 mg by mouth daily. , Disp: , Rfl:  .  potassium chloride (K-DUR,KLOR-CON) 10 MEQ tablet, Take 2 tablets (20 mEq total) by mouth daily., Disp: 60 tablet, Rfl: 5 .  spironolactone (ALDACTONE) 25 MG tablet, Take 1 tablet (25 mg total) by mouth every other day., Disp: 15 tablet, Rfl: 5 .  torsemide (DEMADEX) 20 MG tablet, Take 1 tablet (20 mg total) by mouth 2 (two) times daily., Disp: 60 tablet, Rfl: 11 .  Travoprost, BAK Free, (TRAVATAN) 0.004 % SOLN ophthalmic solution, Place 1 drop into both eyes daily., Disp: , Rfl:  .  calcium citrate (CALCITRATE - DOSED IN MG ELEMENTAL CALCIUM)  950 MG tablet, Take 1 tablet by mouth 2 (two) times daily. , Disp: , Rfl:  .  celecoxib (CELEBREX) 200 MG capsule, Take 200 mg by mouth daily., Disp: , Rfl:  .  nitroGLYCERIN (NITROSTAT) 0.4 MG SL tablet, DISSOLVE ONE TABLET UNDER THE TONGUE EVERY 5 MINUTES AS NEEDED FOR CHEST PAIN. (Patient not taking: Reported on 03/13/2018), Disp: 25 tablet, Rfl: 3 Allergies  Allergen Reactions  . Sulfa Antibiotics Rash  . Uloric [Febuxostat] Rash      Social History   Socioeconomic History  . Marital status: Married    Spouse name: Not on file  . Number of children: Not on file  . Years of education: Not on file  . Highest education level: Not on file  Occupational History  . Not on file  Social Needs  . Financial resource strain: Not on file  . Food  insecurity:    Worry: Not on file    Inability: Not on file  . Transportation needs:    Medical: Not on file    Non-medical: Not on file  Tobacco Use  . Smoking status: Former Smoker    Packs/day: 1.00    Years: 22.00    Pack years: 22.00    Types: Cigarettes    Last attempt to quit: 1983    Years since quitting: 36.8  . Smokeless tobacco: Never Used  Substance and Sexual Activity  . Alcohol use: Yes    Comment: ocassional  . Drug use: No  . Sexual activity: Never    Birth control/protection: None, Post-menopausal  Lifestyle  . Physical activity:    Days per week: Not on file    Minutes per session: Not on file  . Stress: Not on file  Relationships  . Social connections:    Talks on phone: Not on file    Gets together: Not on file    Attends religious service: Not on file    Active member of club or organization: Not on file    Attends meetings of clubs or organizations: Not on file    Relationship status: Not on file  . Intimate partner violence:    Fear of current or ex partner: Not on file    Emotionally abused: Not on file    Physically abused: Not on file    Forced sexual activity: Not on file  Other Topics Concern  . Not on file  Social History Narrative  . Not on file    Physical Exam  Constitutional: She is oriented to person, place, and time.  Cardiovascular: Normal rate and regular rhythm.  Pulmonary/Chest: Effort normal and breath sounds normal.  Abdominal: Soft.  Musculoskeletal: Normal range of motion. She exhibits edema.  Neurological: She is alert and oriented to person, place, and time.  Skin: Skin is warm and dry.  Psychiatric: She has a normal mood and affect.        Future Appointments  Date Time Provider [place]  04/03/2018 11:30 AM Sherran Needs, NP [facility] None  05/07/2018 10:00 AM [facility] PA/NP [facility] None  05/20/2018  8:10 AM [facility] DEVICE REMOTES [facility]  05/30/2018 12:00 PM Marzetta Board, MD [facility]    BP 120/72 (BP Location: Right Arm, Patient Position: Sitting, Cuff Size: Large)   Pulse 70   Resp 18   Wt 204 lb (92.5 kg)   SpO2 98%   BMI 35.02 kg/m   Weight yesterday- 203 lb Last visit weight- 204 lb  Danielle [name] was  seen at home today and reported feeling well with regard to her heart failure. She denied SOB, headache, dizziness or orthopnea. She reported being compliant with her medications over the past week and her weight has been stable. 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 We spoke for a while about her family and their fear of her having knee surgery. Her medications were verified and her pillbox was refilled. All necessary medications were ordered from the pharmacy. She was left with a list of which medications she will be picking up including one OTC medication. The only medications which was not able to be placed in her pillbox is gabapentin and she was given written instructions on where it goes when she picks it up.   Danielle [name], EMT 03/28/18  ACTION: Home visit completed Next visit planned for 1 week

## 2018-04-03 ENCOUNTER — Ambulatory Visit (HOSPITAL_COMMUNITY)
Admission: RE | Admit: 2018-04-03 | Discharge: 2018-04-03 | Disposition: A | Discharge status: Home / Self Care | Payer: Medicare HMO | Source: Ambulatory Visit | Attending: Nurse Practitioner | Admitting: Nurse Practitioner

## 2018-04-03 ENCOUNTER — Telehealth (HOSPITAL_COMMUNITY): Payer: Self-pay

## 2018-04-03 ENCOUNTER — Encounter (HOSPITAL_COMMUNITY): Payer: Self-pay | Admitting: Nurse Practitioner

## 2018-04-03 ENCOUNTER — Other Ambulatory Visit (HOSPITAL_COMMUNITY): Payer: Self-pay | Admitting: *Deleted

## 2018-04-03 VITALS — BP 116/64 | HR 72 | Ht 64.0 in | Wt 205.0 lb

## 2018-04-03 DIAGNOSIS — Z87891 Personal history of nicotine dependence: Secondary | ICD-10-CM | POA: Insufficient documentation

## 2018-04-03 DIAGNOSIS — Z853 Personal history of malignant neoplasm of breast: Secondary | ICD-10-CM | POA: Insufficient documentation

## 2018-04-03 DIAGNOSIS — E119 Type 2 diabetes mellitus without complications: Secondary | ICD-10-CM | POA: Diagnosis not present

## 2018-04-03 DIAGNOSIS — Z79890 Hormone replacement therapy (postmenopausal): Secondary | ICD-10-CM | POA: Diagnosis not present

## 2018-04-03 DIAGNOSIS — I110 Hypertensive heart disease with heart failure: Secondary | ICD-10-CM | POA: Diagnosis not present

## 2018-04-03 DIAGNOSIS — Z923 Personal history of irradiation: Secondary | ICD-10-CM | POA: Insufficient documentation

## 2018-04-03 DIAGNOSIS — E669 Obesity, unspecified: Secondary | ICD-10-CM | POA: Insufficient documentation

## 2018-04-03 DIAGNOSIS — Z950 Presence of cardiac pacemaker: Secondary | ICD-10-CM | POA: Diagnosis not present

## 2018-04-03 DIAGNOSIS — Z833 Family history of diabetes mellitus: Secondary | ICD-10-CM | POA: Diagnosis not present

## 2018-04-03 DIAGNOSIS — Z79899 Other long term (current) drug therapy: Secondary | ICD-10-CM | POA: Diagnosis not present

## 2018-04-03 DIAGNOSIS — Z882 Allergy status to sulfonamides status: Secondary | ICD-10-CM | POA: Insufficient documentation

## 2018-04-03 DIAGNOSIS — I4891 Unspecified atrial fibrillation: Secondary | ICD-10-CM | POA: Diagnosis present

## 2018-04-03 DIAGNOSIS — Z955 Presence of coronary angioplasty implant and graft: Secondary | ICD-10-CM | POA: Insufficient documentation

## 2018-04-03 DIAGNOSIS — Z96652 Presence of left artificial knee joint: Secondary | ICD-10-CM | POA: Diagnosis not present

## 2018-04-03 DIAGNOSIS — I5032 Chronic diastolic (congestive) heart failure: Secondary | ICD-10-CM | POA: Diagnosis not present

## 2018-04-03 DIAGNOSIS — Z9221 Personal history of antineoplastic chemotherapy: Secondary | ICD-10-CM | POA: Insufficient documentation

## 2018-04-03 DIAGNOSIS — G4733 Obstructive sleep apnea (adult) (pediatric): Secondary | ICD-10-CM | POA: Diagnosis not present

## 2018-04-03 DIAGNOSIS — R9431 Abnormal electrocardiogram [ECG] [EKG]: Secondary | ICD-10-CM | POA: Diagnosis not present

## 2018-04-03 DIAGNOSIS — I480 Paroxysmal atrial fibrillation: Secondary | ICD-10-CM | POA: Diagnosis not present

## 2018-04-03 DIAGNOSIS — Z8249 Family history of ischemic heart disease and other diseases of the circulatory system: Secondary | ICD-10-CM | POA: Diagnosis not present

## 2018-04-03 DIAGNOSIS — Z794 Long term (current) use of insulin: Secondary | ICD-10-CM | POA: Insufficient documentation

## 2018-04-03 DIAGNOSIS — Z6830 Body mass index (BMI) 30.0-30.9, adult: Secondary | ICD-10-CM | POA: Diagnosis not present

## 2018-04-03 DIAGNOSIS — E039 Hypothyroidism, unspecified: Secondary | ICD-10-CM | POA: Diagnosis not present

## 2018-04-03 DIAGNOSIS — Z7901 Long term (current) use of anticoagulants: Secondary | ICD-10-CM | POA: Diagnosis not present

## 2018-04-03 DIAGNOSIS — I2510 Atherosclerotic heart disease of native coronary artery without angina pectoris: Secondary | ICD-10-CM | POA: Insufficient documentation

## 2018-04-03 LAB — BASIC METABOLIC PANEL
Anion gap: 11 (ref 5–15)
BUN: 51 mg/dL — ABNORMAL HIGH (ref 8–23)
CALCIUM: 9.1 mg/dL (ref 8.9–10.3)
CHLORIDE: 99 mmol/L (ref 98–111)
CO2: 27 mmol/L (ref 22–32)
CREATININE: 1.94 mg/dL — AB (ref 0.44–1.00)
GFR calc non Af Amer: 25 mL/min — ABNORMAL LOW (ref >60)
GFR, EST AFRICAN AMERICAN: 29 mL/min — AB (ref >60)
Glucose, Bld: 165 mg/dL — ABNORMAL HIGH (ref 70–99)
Potassium: 4.3 mmol/L (ref 3.5–5.1)
SODIUM: 137 mmol/L (ref 135–145)

## 2018-04-03 LAB — CBC
HEMATOCRIT: 30.9 % — AB (ref 36.0–46.0)
HEMOGLOBIN: 9 g/dL — AB (ref 12.0–15.0)
MCH: 19.8 pg — ABNORMAL LOW (ref 26.0–34.0)
MCHC: 29.1 g/dL — ABNORMAL LOW (ref 30.0–36.0)
MCV: 68.1 fL — ABNORMAL LOW (ref 80.0–100.0)
NRBC: 0 % (ref 0.0–0.2)
Platelets: 221 10*3/uL (ref 150–400)
RBC: 4.54 MIL/uL (ref 3.87–5.11)
RDW: 16.2 % — ABNORMAL HIGH (ref 11.5–15.5)
WBC: 9.4 10*3/uL (ref 4.0–10.5)

## 2018-04-03 MED ORDER — APIXABAN 5 MG PO TABS: 5.0000 mg | ORAL_TABLET | Freq: Two times a day (BID) | ORAL | 3 refills | Status: DC

## 2018-04-03 NOTE — Telephone Encounter (Signed)
I called Danielle [name] to schedule an appointment. She stated she would be available tomorrow at 10:00 so we agreed to meet then.

## 2018-04-03 NOTE — Progress Notes (Signed)
Primary Care Physician: Seward Carol, MD Referring Physician: Dr. Ileana Ladd Cardiologist: Dr. Richardo Priest Danielle [name] is a 72 y.o. female with a h/o CAD, s/p 5 stents in the [place] area years ago, acute combined systolic and diastolic CHF, s/p breast cancer, DM, HTN, OSA, hypothyroidism, PPM that is in the afib clinic for recent afib noted on 10/2 that lasted 11 hours. Pt was unaware. She was referred by [facility] for consideration for start of anticoagulation. She is currently on ASA and Brilenta for her CAD. She drinks minimal alcohol, and caffeine, no tobacco. Wears cpap.  F/u in the afib clinic, 11/7 for start of anticoagulation. She has not noted any  bleeding issues. No awareness of irregualr heart beat. She is c/o thirst. She states that her bl sugars at home have been in range and her diuretic dose has not been changed. Pending cbc, bmet today.  Today, she denies symptoms of palpitations, chest pain, shortness of breath, orthopnea, PND, lower extremity edema, dizziness, presyncope, syncope, or neurologic sequela. The patient is tolerating medications without difficulties and is otherwise without complaint today.   Past Medical History:  Diagnosis Date  . Acute combined systolic and diastolic congestive heart failure ([place]) 02/05/2017  . Arthritis   . Blood dyscrasia    per pt-has small blood cells-appears as if anemic  . Breast cancer ([place])   . Cancer (HCC)    breast  . CHF (congestive heart failure) ([place])   . Complication of anesthesia    difficult to awaken from per pt  . Diabetes mellitus without complication ([place])   . Dyspnea   . Essential hypertension   . Heart disease   . Hypothyroidism   . Obesity (BMI 30-39.9) 11/11/2017  . OSA (obstructive sleep apnea) 02/18/2017    severe obstructive sleep apnea with an AHI of 75.6/h and mild central sleep apnea with a CAI of 7.7/h.  Oxygen saturations dropped as low as 82%.   He is on CPAP at 9 cm H2O.  . Personal history of  chemotherapy   . Personal history of radiation therapy   . Presence of permanent cardiac pacemaker   . Sleep apnea   . Thyroid disease    hypothyroid   Past Surgical History:  Procedure Laterality Date  . BREAST BIOPSY    . BREAST LUMPECTOMY Left    1985  . BREAST LUMPECTOMY WITH NEEDLE LOCALIZATION AND AXILLARY LYMPH NODE DISSECTION  1985  . BREAST SURGERY Left    partial mastectomy  . CORONARY STENT PLACEMENT  2010, 2012,2017    2 done 2010 and 1 replaced 2012 and 2 replaced in 2017  . EYE SURGERY Bilateral    bilateral lens implant  . INSERT / REPLACE / REMOVE PACEMAKER  07/19/2014  . REPLACEMENT TOTAL KNEE Left 09/2012  . THYROIDECTOMY, PARTIAL  1978  . TONSILLECTOMY      Current Outpatient Medications  Medication Sig Dispense Refill  . acetaminophen (TYLENOL) 650 MG CR tablet Take 650 mg by mouth every 8 (eight) hours as needed for pain.    Marland Kitchen allopurinol (ZYLOPRIM) 100 MG tablet Take 1 tablet (100 mg total) by mouth daily.    Marland Kitchen apixaban (ELIQUIS) 5 MG TABS tablet Take 1 tablet (5 mg total) by mouth 2 (two) times daily. 60 tablet 3  . atorvastatin (LIPITOR) 80 MG tablet Take 1 tablet (80 mg total) by mouth at bedtime. 30 tablet 11  . Biotin 10 MG CAPS Take 10 mg by mouth daily.    Marland Kitchen  calcium citrate (CALCITRATE - DOSED IN MG ELEMENTAL CALCIUM) 950 MG tablet Take 1 tablet by mouth 2 (two) times daily.     . carvedilol (COREG) 25 MG tablet Take 1 tablet (25 mg total) by mouth 2 (two) times daily. 180 tablet 3  . celecoxib (CELEBREX) 200 MG capsule Take 200 mg by mouth daily.    . CHONDROITIN SULFATE PO Take 250 mg by mouth daily.    . empagliflozin (JARDIANCE) 10 MG TABS tablet Take 10 mg by mouth daily. 30 tablet 11  . ferrous sulfate 325 (65 FE) MG tablet Take 325 mg by mouth 2 (two) times daily with a meal.    . gabapentin (NEURONTIN) 300 MG capsule Take 300 mg by mouth 3 (three) times daily.    . insulin glargine (LANTUS) 100 UNIT/ML injection Inject 30 Units into the skin  2 (two) times daily.     . insulin lispro (HUMALOG) 100 UNIT/ML injection Inject 5 Units into the skin daily with lunch.     . isosorbide mononitrate (IMDUR) 30 MG 24 hr tablet Take 1 tablet (30 mg total) by mouth daily. 30 tablet 3  . Lactobacillus (ACIDOPHILUS PO) Take 1 capsule by mouth daily.    Marland Kitchen levothyroxine (SYNTHROID, LEVOTHROID) 75 MCG tablet Take 75 mcg by mouth daily before breakfast.    . losartan (COZAAR) 50 MG tablet Take 1 tablet (50 mg total) by mouth daily. 30 tablet 3  . metFORMIN (GLUCOPHAGE) 500 MG tablet Take 500 mg by mouth 2 (two) times daily with a meal.     . Multiple Vitamins-Minerals (ALIVE ONCE DAILY WOMENS 50+ PO) Take 1 tablet by mouth daily.    . nitroGLYCERIN (NITROSTAT) 0.4 MG SL tablet DISSOLVE ONE TABLET UNDER THE TONGUE EVERY 5 MINUTES AS NEEDED FOR CHEST PAIN. 25 tablet 3  . omeprazole (PRILOSEC) 20 MG capsule Take 20 mg by mouth daily.     . potassium chloride (K-DUR,KLOR-CON) 10 MEQ tablet Take 2 tablets (20 mEq total) by mouth daily. 60 tablet 5  . spironolactone (ALDACTONE) 25 MG tablet Take 1 tablet (25 mg total) by mouth every other day. 15 tablet 5  . torsemide (DEMADEX) 20 MG tablet Take 1 tablet (20 mg total) by mouth 2 (two) times daily. 60 tablet 11  . traMADol (ULTRAM) 50 MG tablet Take 50 mg by mouth every 8 (eight) hours as needed. for pain  0  . Travoprost, BAK Free, (TRAVATAN) 0.004 % SOLN ophthalmic solution Place 1 drop into both eyes daily.     No current facility-administered medications for this encounter.     Allergies  Allergen Reactions  . Sulfa Antibiotics Rash  . Uloric [Febuxostat] Rash    Social History   Socioeconomic History  . Marital status: Married    Spouse name: Not on file  . Number of children: Not on file  . Years of education: Not on file  . Highest education level: Not on file  Occupational History  . Not on file  Social Needs  . Financial resource strain: Not on file  . Food insecurity:    Worry: Not  on file    Inability: Not on file  . Transportation needs:    Medical: Not on file    Non-medical: Not on file  Tobacco Use  . Smoking status: Former Smoker    Packs/day: 1.00    Years: 22.00    Pack years: 22.00    Types: Cigarettes    Last attempt to quit: 1983  Years since quitting: 36.8  . Smokeless tobacco: Never Used  Substance and Sexual Activity  . Alcohol use: Yes    Comment: ocassional  . Drug use: No  . Sexual activity: Never    Birth control/protection: None, Post-menopausal  Lifestyle  . Physical activity:    Days per week: Not on file    Minutes per session: Not on file  . Stress: Not on file  Relationships  . Social connections:    Talks on phone: Not on file    Gets together: Not on file    Attends religious service: Not on file    Active member of club or organization: Not on file    Attends meetings of clubs or organizations: Not on file    Relationship status: Not on file  . Intimate partner violence:    Fear of current or ex partner: Not on file    Emotionally abused: Not on file    Physically abused: Not on file    Forced sexual activity: Not on file  Other Topics Concern  . Not on file  Social History Narrative  . Not on file    Family History  Problem Relation Age of Onset  . Multiple myeloma Mother   . Heart disease Father   . Other Sister   . Heart disease Brother   . Diabetes Sister   . Other Sister     ROS- All systems are reviewed and negative except as per the HPI above  Physical Exam: Vitals:   04/03/18 1118  BP: 116/64  Pulse: 72  Weight: 93 kg  Height: 5' 4"  (1.626 m)   Wt Readings from Last 3 Encounters:  04/03/18 93 kg  03/28/18 92.5 kg  03/19/18 92.5 kg    Labs: Lab Results  Component Value Date   NA 139 03/13/2018   K 4.8 03/13/2018   CL 99 03/13/2018   CO2 27 03/13/2018   GLUCOSE 250 (H) 03/13/2018   BUN 34 (H) 03/13/2018   CREATININE 1.64 (H) 03/13/2018   CALCIUM 9.6 03/13/2018   No results found  for: INR Lab Results  Component Value Date   CHOL 87 03/06/2017   HDL 38 (L) 03/06/2017   LDLCALC 24 03/06/2017   TRIG 123 03/06/2017     GEN- The patient is well appearing, alert and oriented x 3 today.   Head- normocephalic, atraumatic Eyes-  Sclera clear, conjunctiva pink Ears- hearing intact Oropharynx- clear Neck- supple, no JVP Lymph- no cervical lymphadenopathy Lungs- Clear to ausculation bilaterally, normal work of breathing Heart- Regular rate and rhythm, no murmurs, rubs or gallops, PMI not laterally displaced GI- soft, NT, ND, + BS Extremities- no clubbing, cyanosis, or edema MS- no significant deformity or atrophy Skin- no rash or lesion Psych- euthymic mood, full affect Neuro- strength and sensation are intact  EKG-  NSR at 72 bpm, Pr int 72 bpm, qrs int 102 ms, qtc 464 ms     Assessment and Plan: 1. New onset paroxysmal  afib 11 hour duration 10/2, very low afib burden SR today  Pt was asymptomatic with 11 hour duration Continue carvedilol for rate control    2. CHA2DS2VASc score of 5 Spoke to Dr. Haroldine Laws on last visit and  Asa/ brillenta stopped for start of eliquis No bleeding history  Continue on eliquis 5 mg bid, no bleeding issues noted Bleeding precautions discussed  Cbc/bmet today  F/u with AHF clinic 12/11  Remote device check 12/24 afib clinic as needed  Butch Penny  Eulah Pont, [facility] [address] [place], [place] [address] [phone]

## 2018-04-04 ENCOUNTER — Other Ambulatory Visit (HOSPITAL_COMMUNITY): Payer: Self-pay

## 2018-04-04 NOTE — Progress Notes (Signed)
Paramedicine Encounter    Patient ID: Mirage Pfefferkorn, female    DOB: 03-10-1946, 72 y.o.   MRN: [id number]   Patient Care Team: Seward Carol, MD as PCP - General (Internal Medicine)  Patient Active Problem List   Diagnosis Date Noted  . (HFpEF) heart failure with preserved ejection fraction ([place]) 12/20/2017  . Obesity (BMI 30-39.9) 11/11/2017  . OSA (obstructive sleep apnea) 02/18/2017  . Essential hypertension   . Diabetes mellitus without complication ([place])   . Cancer ([place])   . Thyroid disease     Current Outpatient Medications:  .  acetaminophen (TYLENOL) 650 MG CR tablet, Take 650 mg by mouth every 8 (eight) hours as needed for pain., Disp: , Rfl:  .  allopurinol (ZYLOPRIM) 100 MG tablet, Take 1 tablet (100 mg total) by mouth daily., Disp: , Rfl:  .  apixaban (ELIQUIS) 5 MG TABS tablet, Take 1 tablet (5 mg total) by mouth 2 (two) times daily., Disp: 60 tablet, Rfl: 3 .  atorvastatin (LIPITOR) 80 MG tablet, Take 1 tablet (80 mg total) by mouth at bedtime., Disp: 30 tablet, Rfl: 11 .  Biotin 10 MG CAPS, Take 10 mg by mouth daily., Disp: , Rfl:  .  carvedilol (COREG) 25 MG tablet, Take 1 tablet (25 mg total) by mouth 2 (two) times daily., Disp: 180 tablet, Rfl: 3 .  celecoxib (CELEBREX) 200 MG capsule, Take 200 mg by mouth daily., Disp: , Rfl:  .  CHONDROITIN SULFATE PO, Take 250 mg by mouth daily., Disp: , Rfl:  .  empagliflozin (JARDIANCE) 10 MG TABS tablet, Take 10 mg by mouth daily., Disp: 30 tablet, Rfl: 11 .  ferrous sulfate 325 (65 FE) MG tablet, Take 325 mg by mouth 2 (two) times daily with a meal., Disp: , Rfl:  .  gabapentin (NEURONTIN) 300 MG capsule, Take 300 mg by mouth 3 (three) times daily., Disp: , Rfl:  .  insulin glargine (LANTUS) 100 UNIT/ML injection, Inject 30 Units into the skin 2 (two) times daily. , Disp: , Rfl:  .  insulin lispro (HUMALOG) 100 UNIT/ML injection, Inject 5 Units into the skin daily with lunch. , Disp: , Rfl:  .  isosorbide mononitrate (IMDUR)  30 MG 24 hr tablet, Take 1 tablet (30 mg total) by mouth daily., Disp: 30 tablet, Rfl: 3 .  Lactobacillus (ACIDOPHILUS PO), Take 1 capsule by mouth daily., Disp: , Rfl:  .  levothyroxine (SYNTHROID, LEVOTHROID) 75 MCG tablet, Take 75 mcg by mouth daily before breakfast., Disp: , Rfl:  .  losartan (COZAAR) 50 MG tablet, Take 1 tablet (50 mg total) by mouth daily., Disp: 30 tablet, Rfl: 3 .  metFORMIN (GLUCOPHAGE) 500 MG tablet, Take 500 mg by mouth 2 (two) times daily with a meal. , Disp: , Rfl:  .  Multiple Vitamins-Minerals (ALIVE ONCE DAILY WOMENS 50+ PO), Take 1 tablet by mouth daily., Disp: , Rfl:  .  omeprazole (PRILOSEC) 20 MG capsule, Take 20 mg by mouth daily. , Disp: , Rfl:  .  potassium chloride (K-DUR,KLOR-CON) 10 MEQ tablet, Take 2 tablets (20 mEq total) by mouth daily., Disp: 60 tablet, Rfl: 5 .  spironolactone (ALDACTONE) 25 MG tablet, Take 1 tablet (25 mg total) by mouth every other day., Disp: 15 tablet, Rfl: 5 .  torsemide (DEMADEX) 20 MG tablet, Take 1 tablet (20 mg total) by mouth 2 (two) times daily., Disp: 60 tablet, Rfl: 11 .  traMADol (ULTRAM) 50 MG tablet, Take 50 mg by mouth every 8 (eight) hours  as needed. for pain, Disp: , Rfl: 0 .  Travoprost, BAK Free, (TRAVATAN) 0.004 % SOLN ophthalmic solution, Place 1 drop into both eyes daily., Disp: , Rfl:  .  calcium citrate (CALCITRATE - DOSED IN MG ELEMENTAL CALCIUM) 950 MG tablet, Take 1 tablet by mouth 2 (two) times daily. , Disp: , Rfl:  .  nitroGLYCERIN (NITROSTAT) 0.4 MG SL tablet, DISSOLVE ONE TABLET UNDER THE TONGUE EVERY 5 MINUTES AS NEEDED FOR CHEST PAIN. (Patient not taking: Reported on 04/04/2018), Disp: 25 tablet, Rfl: 3 Allergies  Allergen Reactions  . Sulfa Antibiotics Rash  . Uloric [Febuxostat] Rash      Social History   Socioeconomic History  . Marital status: Married    Spouse name: Not on file  . Number of children: Not on file  . Years of education: Not on file  . Highest education level: Not on  file  Occupational History  . Not on file  Social Needs  . Financial resource strain: Not on file  . Food insecurity:    Worry: Not on file    Inability: Not on file  . Transportation needs:    Medical: Not on file    Non-medical: Not on file  Tobacco Use  . Smoking status: Former Smoker    Packs/day: 1.00    Years: 22.00    Pack years: 22.00    Types: Cigarettes    Last attempt to quit: 1983    Years since quitting: 36.8  . Smokeless tobacco: Never Used  Substance and Sexual Activity  . Alcohol use: Yes    Comment: ocassional  . Drug use: No  . Sexual activity: Never    Birth control/protection: None, Post-menopausal  Lifestyle  . Physical activity:    Days per week: Not on file    Minutes per session: Not on file  . Stress: Not on file  Relationships  . Social connections:    Talks on phone: Not on file    Gets together: Not on file    Attends religious service: Not on file    Active member of club or organization: Not on file    Attends meetings of clubs or organizations: Not on file    Relationship status: Not on file  . Intimate partner violence:    Fear of current or ex partner: Not on file    Emotionally abused: Not on file    Physically abused: Not on file    Forced sexual activity: Not on file  Other Topics Concern  . Not on file  Social History Narrative  . Not on file    Physical Exam  Constitutional: She is oriented to person, place, and time.  Cardiovascular: Normal rate and regular rhythm.  Pulmonary/Chest: Effort normal and breath sounds normal.  Abdominal: Soft.  Musculoskeletal: Normal range of motion. She exhibits edema.  Neurological: She is alert and oriented to person, place, and time.  Skin: Skin is warm and dry.  Psychiatric: She has a normal mood and affect.        Future Appointments  Date Time Provider [place]  05/07/2018 10:00 AM [facility] PA/NP [facility] None  05/20/2018  8:10 AM [facility] DEVICE REMOTES  [facility]  05/30/2018 12:00 PM [place], Stephani Police, DPM [facility]    BP 100/60 (BP Location: Right Arm, Patient Position: Sitting, Cuff Size: Large)   Pulse 76   Wt 202 lb (91.6 kg)   SpO2 96%   BMI 34.67 kg/m   Weight yesterday- 200  lb Last visit weight- 204 lb  Ms Sanmiguel was seen at home today and reported feeling well despite arthritic pain. She denied chest pain, SOB, headache, dizziness or orthopnea. She reported being compliant with her medications and her weight has been stable over the past week. Her medications were verified and her pillbox was refilled.   [name], EMT 04/04/18  ACTION: Home visit completed Next visit planned for 1 week

## 2018-04-07 ENCOUNTER — Telehealth (HOSPITAL_COMMUNITY): Payer: Self-pay | Admitting: Cardiology

## 2018-04-07 DIAGNOSIS — I5032 Chronic diastolic (congestive) heart failure: Secondary | ICD-10-CM

## 2018-04-07 NOTE — Telephone Encounter (Signed)
Notes recorded by Kerry Dory, CMA on 04/07/2018 at 4:36 PM EST Patient aware. Repeat labs 04/22/18  ------  Notes recorded by Georgiana Shore, NP on 04/07/2018 at 1:30 PM EST Renal function mildly elevated. Have her hold torsemide for 2 days. Recheck BMET in 2 weeks. Thanks ------  Notes recorded by Sherran Needs, NP on 04/03/2018 at 4:28 PM EST Cbc/bmet repeated today for routine start of anticoagulation. Pt was c/o of being thirsty.Her creatinine /bun elevated according to baseline. No recent increase in diuretics. Recent start of Jardiance. Will forward to [place] to review. CBC looks stable compared to one year ago.

## 2018-04-07 NOTE — Telephone Encounter (Signed)
-----   Message from Georgiana Shore, NP sent at 04/07/2018  1:30 PM EST ----- Renal function mildly elevated. Have her hold torsemide for 2 days. Recheck BMET in 2 weeks. Thanks

## 2018-04-09 ENCOUNTER — Telehealth (HOSPITAL_COMMUNITY): Payer: Self-pay

## 2018-04-09 DIAGNOSIS — H6121 Impacted cerumen, right ear: Secondary | ICD-10-CM | POA: Diagnosis not present

## 2018-04-10 NOTE — Telephone Encounter (Signed)
I called Danielle [name] to schedule an appointment. She stated she would be available for a meeting on Friday morning so we agreed to meet at 09:30.

## 2018-04-11 ENCOUNTER — Other Ambulatory Visit (HOSPITAL_COMMUNITY): Payer: Self-pay

## 2018-04-11 NOTE — Progress Notes (Signed)
Paramedicine Encounter    Patient ID: Danielle [name], female    DOB: 1946/04/22, 72 y.o.   MRN: [id number]   Patient Care Team: Seward Carol, MD as PCP - General (Internal Medicine)  Patient Active Problem List   Diagnosis Date Noted  . (HFpEF) heart failure with preserved ejection fraction ([place]) 12/20/2017  . Obesity (BMI 30-39.9) 11/11/2017  . OSA (obstructive sleep apnea) 02/18/2017  . Essential hypertension   . Diabetes mellitus without complication ([place])   . Cancer ([place])   . Thyroid disease     Current Outpatient Medications:  .  acetaminophen (TYLENOL) 650 MG CR tablet, Take 650 mg by mouth every 8 (eight) hours as needed for pain., Disp: , Rfl:  .  allopurinol (ZYLOPRIM) 100 MG tablet, Take 1 tablet (100 mg total) by mouth daily., Disp: , Rfl:  .  apixaban (ELIQUIS) 5 MG TABS tablet, Take 1 tablet (5 mg total) by mouth 2 (two) times daily., Disp: 60 tablet, Rfl: 3 .  atorvastatin (LIPITOR) 80 MG tablet, Take 1 tablet (80 mg total) by mouth at bedtime., Disp: 30 tablet, Rfl: 11 .  Biotin 10 MG CAPS, Take 10 mg by mouth daily., Disp: , Rfl:  .  carvedilol (COREG) 25 MG tablet, Take 1 tablet (25 mg total) by mouth 2 (two) times daily., Disp: 180 tablet, Rfl: 3 .  CHONDROITIN SULFATE PO, Take 250 mg by mouth daily., Disp: , Rfl:  .  empagliflozin (JARDIANCE) 10 MG TABS tablet, Take 10 mg by mouth daily., Disp: 30 tablet, Rfl: 11 .  ferrous sulfate 325 (65 FE) MG tablet, Take 325 mg by mouth 2 (two) times daily with a meal., Disp: , Rfl:  .  gabapentin (NEURONTIN) 300 MG capsule, Take 300 mg by mouth 3 (three) times daily., Disp: , Rfl:  .  insulin glargine (LANTUS) 100 UNIT/ML injection, Inject 30 Units into the skin 2 (two) times daily. , Disp: , Rfl:  .  insulin lispro (HUMALOG) 100 UNIT/ML injection, Inject 5 Units into the skin daily with lunch. , Disp: , Rfl:  .  isosorbide mononitrate (IMDUR) 30 MG 24 hr tablet, Take 1 tablet (30 mg total) by mouth daily., Disp: 30 tablet,  Rfl: 3 .  Lactobacillus (ACIDOPHILUS PO), Take 1 capsule by mouth daily., Disp: , Rfl:  .  levothyroxine (SYNTHROID, LEVOTHROID) 75 MCG tablet, Take 75 mcg by mouth daily before breakfast., Disp: , Rfl:  .  losartan (COZAAR) 50 MG tablet, Take 1 tablet (50 mg total) by mouth daily., Disp: 30 tablet, Rfl: 3 .  metFORMIN (GLUCOPHAGE) 500 MG tablet, Take 500 mg by mouth 2 (two) times daily with a meal. , Disp: , Rfl:  .  Multiple Vitamins-Minerals (ALIVE ONCE DAILY WOMENS 50+ PO), Take 1 tablet by mouth daily., Disp: , Rfl:  .  omeprazole (PRILOSEC) 20 MG capsule, Take 20 mg by mouth daily. , Disp: , Rfl:  .  potassium chloride (K-DUR,KLOR-CON) 10 MEQ tablet, Take 2 tablets (20 mEq total) by mouth daily., Disp: 60 tablet, Rfl: 5 .  spironolactone (ALDACTONE) 25 MG tablet, Take 1 tablet (25 mg total) by mouth every other day., Disp: 15 tablet, Rfl: 5 .  torsemide (DEMADEX) 20 MG tablet, Take 1 tablet (20 mg total) by mouth 2 (two) times daily., Disp: 60 tablet, Rfl: 11 .  traMADol (ULTRAM) 50 MG tablet, Take 50 mg by mouth every 8 (eight) hours as needed. for pain, Disp: , Rfl: 0 .  Travoprost, BAK Free, (TRAVATAN) 0.004 % SOLN  ophthalmic solution, Place 1 drop into both eyes daily., Disp: , Rfl:  .  calcium citrate (CALCITRATE - DOSED IN MG ELEMENTAL CALCIUM) 950 MG tablet, Take 1 tablet by mouth 2 (two) times daily. , Disp: , Rfl:  .  celecoxib (CELEBREX) 200 MG capsule, Take 200 mg by mouth daily., Disp: , Rfl:  .  nitroGLYCERIN (NITROSTAT) 0.4 MG SL tablet, DISSOLVE ONE TABLET UNDER THE TONGUE EVERY 5 MINUTES AS NEEDED FOR CHEST PAIN. (Patient not taking: Reported on 04/04/2018), Disp: 25 tablet, Rfl: 3 Allergies  Allergen Reactions  . Sulfa Antibiotics Rash  . Uloric [Febuxostat] Rash      Social History   Socioeconomic History  . Marital status: Married    Spouse name: Not on file  . Number of children: Not on file  . Years of education: Not on file  . Highest education level: Not on  file  Occupational History  . Not on file  Social Needs  . Financial resource strain: Not on file  . Food insecurity:    Worry: Not on file    Inability: Not on file  . Transportation needs:    Medical: Not on file    Non-medical: Not on file  Tobacco Use  . Smoking status: Former Smoker    Packs/day: 1.00    Years: 22.00    Pack years: 22.00    Types: Cigarettes    Last attempt to quit: 1983    Years since quitting: 36.8  . Smokeless tobacco: Never Used  Substance and Sexual Activity  . Alcohol use: Yes    Comment: ocassional  . Drug use: No  . Sexual activity: Never    Birth control/protection: None, Post-menopausal  Lifestyle  . Physical activity:    Days per week: Not on file    Minutes per session: Not on file  . Stress: Not on file  Relationships  . Social connections:    Talks on phone: Not on file    Gets together: Not on file    Attends religious service: Not on file    Active member of club or organization: Not on file    Attends meetings of clubs or organizations: Not on file    Relationship status: Not on file  . Intimate partner violence:    Fear of current or ex partner: Not on file    Emotionally abused: Not on file    Physically abused: Not on file    Forced sexual activity: Not on file  Other Topics Concern  . Not on file  Social History Narrative  . Not on file    Physical Exam  Constitutional: She is oriented to person, place, and time.  Cardiovascular: Normal rate and regular rhythm.  Pulmonary/Chest: Effort normal and breath sounds normal.  Abdominal: Soft.  Musculoskeletal: Normal range of motion. She exhibits edema.  Neurological: She is alert and oriented to person, place, and time.  Skin: Skin is warm and dry.  Psychiatric: She has a normal mood and affect.        Future Appointments  Date Time Provider [place]  04/21/2018  9:30 AM [facility] LAB [facility] None  05/07/2018 10:00 AM [facility] PA/NP [facility] None   05/20/2018  8:10 AM [facility] DEVICE REMOTES [facility]  05/30/2018 12:00 PM [place], Stephani Police, DPM [facility]    BP 116/60 (BP Location: Left Arm, Patient Position: Sitting, Cuff Size: Large)   Pulse 71   Resp 16   Wt 204 lb 3.2 oz (92.6  kg)   SpO2 96%   BMI 35.05 kg/m   Weight yesterday- 202.8 lb Last visit weight- 202 lb  Ms Ishler was seen at home today and reported feeling well. She denied chest pain, SOB, headache, dizziness or orthopnea. Dhe reported that she has been compliant with her medications over the past week and her weights have been stable. She did follow clinic instructions last night and did not take her torsemide last night or this morning. I verified her medications and refilled her pillbox, leaving torsemide out for the next two days.   [name], EMT 04/11/18  ACTION: Home visit completed Next visit planned for 1 week

## 2018-04-15 DIAGNOSIS — H9203 Otalgia, bilateral: Secondary | ICD-10-CM | POA: Diagnosis not present

## 2018-04-15 DIAGNOSIS — L299 Pruritus, unspecified: Secondary | ICD-10-CM | POA: Diagnosis not present

## 2018-04-15 DIAGNOSIS — M542 Cervicalgia: Secondary | ICD-10-CM | POA: Diagnosis not present

## 2018-04-17 ENCOUNTER — Telehealth (HOSPITAL_COMMUNITY): Payer: Self-pay

## 2018-04-17 NOTE — Telephone Encounter (Signed)
I called Danielle [name] to schedule an appointment. She stated she would be available tomorrow afternoon so we agreed to meet at 14:00.

## 2018-04-18 ENCOUNTER — Other Ambulatory Visit (HOSPITAL_COMMUNITY): Payer: Self-pay

## 2018-04-18 ENCOUNTER — Telehealth (HOSPITAL_COMMUNITY): Payer: Self-pay

## 2018-04-18 NOTE — Progress Notes (Signed)
Paramedicine Encounter    Patient ID: Danielle [name], female    DOB: Sep 29, 1945, 72 y.o.   MRN: [id number]   Patient Care Team: Seward Carol, MD as PCP - General (Internal Medicine)  Patient Active Problem List   Diagnosis Date Noted  . (HFpEF) heart failure with preserved ejection fraction ([place]) 12/20/2017  . Obesity (BMI 30-39.9) 11/11/2017  . OSA (obstructive sleep apnea) 02/18/2017  . Essential hypertension   . Diabetes mellitus without complication ([place])   . Cancer ([place])   . Thyroid disease     Current Outpatient Medications:  .  acetaminophen (TYLENOL) 650 MG CR tablet, Take 650 mg by mouth every 8 (eight) hours as needed for pain., Disp: , Rfl:  .  allopurinol (ZYLOPRIM) 100 MG tablet, Take 1 tablet (100 mg total) by mouth daily., Disp: , Rfl:  .  apixaban (ELIQUIS) 5 MG TABS tablet, Take 1 tablet (5 mg total) by mouth 2 (two) times daily., Disp: 60 tablet, Rfl: 3 .  atorvastatin (LIPITOR) 80 MG tablet, Take 1 tablet (80 mg total) by mouth at bedtime., Disp: 30 tablet, Rfl: 11 .  Biotin 10 MG CAPS, Take 10 mg by mouth daily., Disp: , Rfl:  .  carvedilol (COREG) 25 MG tablet, Take 1 tablet (25 mg total) by mouth 2 (two) times daily., Disp: 180 tablet, Rfl: 3 .  CHONDROITIN SULFATE PO, Take 250 mg by mouth daily., Disp: , Rfl:  .  empagliflozin (JARDIANCE) 10 MG TABS tablet, Take 10 mg by mouth daily., Disp: 30 tablet, Rfl: 11 .  ferrous sulfate 325 (65 FE) MG tablet, Take 325 mg by mouth 2 (two) times daily with a meal., Disp: , Rfl:  .  gabapentin (NEURONTIN) 300 MG capsule, Take 300 mg by mouth 3 (three) times daily., Disp: , Rfl:  .  insulin glargine (LANTUS) 100 UNIT/ML injection, Inject 30 Units into the skin 2 (two) times daily. , Disp: , Rfl:  .  insulin lispro (HUMALOG) 100 UNIT/ML injection, Inject 5 Units into the skin daily with lunch. , Disp: , Rfl:  .  isosorbide mononitrate (IMDUR) 30 MG 24 hr tablet, Take 1 tablet (30 mg total) by mouth daily., Disp: 30 tablet,  Rfl: 3 .  Lactobacillus (ACIDOPHILUS PO), Take 1 capsule by mouth daily., Disp: , Rfl:  .  levothyroxine (SYNTHROID, LEVOTHROID) 75 MCG tablet, Take 75 mcg by mouth daily before breakfast., Disp: , Rfl:  .  losartan (COZAAR) 50 MG tablet, Take 1 tablet (50 mg total) by mouth daily., Disp: 30 tablet, Rfl: 3 .  metFORMIN (GLUCOPHAGE) 500 MG tablet, Take 500 mg by mouth 2 (two) times daily with a meal. , Disp: , Rfl:  .  Multiple Vitamins-Minerals (ALIVE ONCE DAILY WOMENS 50+ PO), Take 1 tablet by mouth daily., Disp: , Rfl:  .  omeprazole (PRILOSEC) 20 MG capsule, Take 20 mg by mouth daily. , Disp: , Rfl:  .  potassium chloride (K-DUR,KLOR-CON) 10 MEQ tablet, Take 2 tablets (20 mEq total) by mouth daily., Disp: 60 tablet, Rfl: 5 .  spironolactone (ALDACTONE) 25 MG tablet, Take 1 tablet (25 mg total) by mouth every other day., Disp: 15 tablet, Rfl: 5 .  torsemide (DEMADEX) 20 MG tablet, Take 1 tablet (20 mg total) by mouth 2 (two) times daily., Disp: 60 tablet, Rfl: 11 .  Travoprost, BAK Free, (TRAVATAN) 0.004 % SOLN ophthalmic solution, Place 1 drop into both eyes daily., Disp: , Rfl:  .  calcium citrate (CALCITRATE - DOSED IN MG ELEMENTAL CALCIUM)  950 MG tablet, Take 1 tablet by mouth 2 (two) times daily. , Disp: , Rfl:  .  celecoxib (CELEBREX) 200 MG capsule, Take 200 mg by mouth daily., Disp: , Rfl:  .  nitroGLYCERIN (NITROSTAT) 0.4 MG SL tablet, DISSOLVE ONE TABLET UNDER THE TONGUE EVERY 5 MINUTES AS NEEDED FOR CHEST PAIN. (Patient not taking: Reported on 04/04/2018), Disp: 25 tablet, Rfl: 3 .  traMADol (ULTRAM) 50 MG tablet, Take 50 mg by mouth every 8 (eight) hours as needed. for pain, Disp: , Rfl: 0 Allergies  Allergen Reactions  . Sulfa Antibiotics Rash  . Uloric [Febuxostat] Rash      Social History   Socioeconomic History  . Marital status: Married    Spouse name: Not on file  . Number of children: Not on file  . Years of education: Not on file  . Highest education level: Not on  file  Occupational History  . Not on file  Social Needs  . Financial resource strain: Not on file  . Food insecurity:    Worry: Not on file    Inability: Not on file  . Transportation needs:    Medical: Not on file    Non-medical: Not on file  Tobacco Use  . Smoking status: Former Smoker    Packs/day: 1.00    Years: 22.00    Pack years: 22.00    Types: Cigarettes    Last attempt to quit: 1983    Years since quitting: 36.9  . Smokeless tobacco: Never Used  Substance and Sexual Activity  . Alcohol use: Yes    Comment: ocassional  . Drug use: No  . Sexual activity: Never    Birth control/protection: None, Post-menopausal  Lifestyle  . Physical activity:    Days per week: Not on file    Minutes per session: Not on file  . Stress: Not on file  Relationships  . Social connections:    Talks on phone: Not on file    Gets together: Not on file    Attends religious service: Not on file    Active member of club or organization: Not on file    Attends meetings of clubs or organizations: Not on file    Relationship status: Not on file  . Intimate partner violence:    Fear of current or ex partner: Not on file    Emotionally abused: Not on file    Physically abused: Not on file    Forced sexual activity: Not on file  Other Topics Concern  . Not on file  Social History Narrative  . Not on file    Physical Exam  Constitutional: She is oriented to person, place, and time.  Cardiovascular: Normal rate and regular rhythm.  Pulmonary/Chest: Effort normal and breath sounds normal.  Musculoskeletal: Normal range of motion. She exhibits edema.  Neurological: She is alert and oriented to person, place, and time.  Skin: Skin is warm and dry.  Psychiatric: She has a normal mood and affect.        Future Appointments  Date Time Provider [place]  04/21/2018  9:30 AM [facility] LAB [facility] None  05/07/2018 10:00 AM [facility] PA/NP [facility] None  05/20/2018  8:10 AM  [facility] DEVICE REMOTES [facility]  05/30/2018 12:00 PM [place], Stephani Police, DPM [facility]    BP 96/60 (BP Location: Right Arm, Patient Position: Sitting, Cuff Size: Large)   Pulse 63   Resp 18   Wt 199 lb 9.6 oz (90.5 kg)  SpO2 97%   BMI 34.26 kg/m   Weight yesterday- 200.4 lb Last visit weight- 204.2 lb  Danielle [name] was seen at home today and reported feeling well with the exception of arthritic pain. She denied chest pain, SOB, headache, dizziness or orthopnea. She reported having been compliant with her medications over the past week and her weight has been decreasing. She stated she has been using smaller portion sizes which has contributed to her weight loss. Her medications were verified and her pillbox was refilled. All necessary medications were ordered from the pharmacy to be available next week.    [name], EMT 04/18/18  ACTION: Home visit completed Next visit planned for 1 week

## 2018-04-18 NOTE — Telephone Encounter (Signed)
medical clearance faxed  rt total knee 04/18/18 to [facility]

## 2018-04-21 ENCOUNTER — Ambulatory Visit (HOSPITAL_COMMUNITY)
Admission: RE | Admit: 2018-04-21 | Discharge: 2018-04-21 | Disposition: A | Discharge status: Home / Self Care | Payer: Medicare HMO | Source: Ambulatory Visit | Attending: Cardiology | Admitting: Cardiology

## 2018-04-21 ENCOUNTER — Telehealth (HOSPITAL_COMMUNITY): Payer: Self-pay

## 2018-04-21 DIAGNOSIS — I5032 Chronic diastolic (congestive) heart failure: Secondary | ICD-10-CM | POA: Insufficient documentation

## 2018-04-21 LAB — BASIC METABOLIC PANEL
ANION GAP: 11 (ref 5–15)
BUN: 33 mg/dL — AB (ref 8–23)
CHLORIDE: 103 mmol/L (ref 98–111)
CO2: 27 mmol/L (ref 22–32)
Calcium: 9.6 mg/dL (ref 8.9–10.3)
Creatinine, Ser: 1.51 mg/dL — ABNORMAL HIGH (ref 0.44–1.00)
GFR calc Af Amer: 39 mL/min — ABNORMAL LOW (ref >60)
GFR, EST NON AFRICAN AMERICAN: 34 mL/min — AB (ref >60)
GLUCOSE: 80 mg/dL (ref 70–99)
Potassium: 4 mmol/L (ref 3.5–5.1)
Sodium: 141 mmol/L (ref 135–145)

## 2018-04-21 NOTE — Telephone Encounter (Signed)
I called Ms [name] to schedule an appointment. She stated she would be able to meet on Friday at 10:00 so we agreed to meet then.

## 2018-04-25 ENCOUNTER — Other Ambulatory Visit (HOSPITAL_COMMUNITY): Payer: Self-pay

## 2018-04-25 NOTE — Progress Notes (Signed)
Paramedicine Encounter    Patient ID: Danielle [name], female    DOB: Dec 09, 1945, 72 y.o.   MRN: [id number]   Patient Care Team: Seward Carol, MD as PCP - General (Internal Medicine)  Patient Active Problem List   Diagnosis Date Noted  . (HFpEF) heart failure with preserved ejection fraction ([place]) 12/20/2017  . Obesity (BMI 30-39.9) 11/11/2017  . OSA (obstructive sleep apnea) 02/18/2017  . Essential hypertension   . Diabetes mellitus without complication ([place])   . Cancer ([place])   . Thyroid disease     Current Outpatient Medications:  .  allopurinol (ZYLOPRIM) 100 MG tablet, Take 1 tablet (100 mg total) by mouth daily., Disp: , Rfl:  .  apixaban (ELIQUIS) 5 MG TABS tablet, Take 1 tablet (5 mg total) by mouth 2 (two) times daily., Disp: 60 tablet, Rfl: 3 .  atorvastatin (LIPITOR) 80 MG tablet, Take 1 tablet (80 mg total) by mouth at bedtime., Disp: 30 tablet, Rfl: 11 .  Biotin 10 MG CAPS, Take 10 mg by mouth daily., Disp: , Rfl:  .  carvedilol (COREG) 25 MG tablet, Take 1 tablet (25 mg total) by mouth 2 (two) times daily., Disp: 180 tablet, Rfl: 3 .  CHONDROITIN SULFATE PO, Take 250 mg by mouth daily., Disp: , Rfl:  .  empagliflozin (JARDIANCE) 10 MG TABS tablet, Take 10 mg by mouth daily., Disp: 30 tablet, Rfl: 11 .  ferrous sulfate 325 (65 FE) MG tablet, Take 325 mg by mouth 2 (two) times daily with a meal., Disp: , Rfl:  .  gabapentin (NEURONTIN) 300 MG capsule, Take 300 mg by mouth 3 (three) times daily., Disp: , Rfl:  .  insulin glargine (LANTUS) 100 UNIT/ML injection, Inject 30 Units into the skin 2 (two) times daily. , Disp: , Rfl:  .  insulin lispro (HUMALOG) 100 UNIT/ML injection, Inject 5 Units into the skin daily with lunch. , Disp: , Rfl:  .  isosorbide mononitrate (IMDUR) 30 MG 24 hr tablet, Take 1 tablet (30 mg total) by mouth daily., Disp: 30 tablet, Rfl: 3 .  Lactobacillus (ACIDOPHILUS PO), Take 1 capsule by mouth daily., Disp: , Rfl:  .  levothyroxine (SYNTHROID,  LEVOTHROID) 75 MCG tablet, Take 75 mcg by mouth daily before breakfast., Disp: , Rfl:  .  losartan (COZAAR) 50 MG tablet, Take 1 tablet (50 mg total) by mouth daily., Disp: 30 tablet, Rfl: 3 .  metFORMIN (GLUCOPHAGE) 500 MG tablet, Take 500 mg by mouth 2 (two) times daily with a meal. , Disp: , Rfl:  .  Multiple Vitamins-Minerals (ALIVE ONCE DAILY WOMENS 50+ PO), Take 1 tablet by mouth daily., Disp: , Rfl:  .  omeprazole (PRILOSEC) 20 MG capsule, Take 20 mg by mouth daily. , Disp: , Rfl:  .  potassium chloride (K-DUR,KLOR-CON) 10 MEQ tablet, Take 2 tablets (20 mEq total) by mouth daily., Disp: 60 tablet, Rfl: 5 .  spironolactone (ALDACTONE) 25 MG tablet, Take 1 tablet (25 mg total) by mouth every other day., Disp: 15 tablet, Rfl: 5 .  torsemide (DEMADEX) 20 MG tablet, Take 1 tablet (20 mg total) by mouth 2 (two) times daily., Disp: 60 tablet, Rfl: 11 .  Travoprost, BAK Free, (TRAVATAN) 0.004 % SOLN ophthalmic solution, Place 1 drop into both eyes daily., Disp: , Rfl:  .  acetaminophen (TYLENOL) 650 MG CR tablet, Take 650 mg by mouth every 8 (eight) hours as needed for pain., Disp: , Rfl:  .  calcium citrate (CALCITRATE - DOSED IN MG ELEMENTAL CALCIUM)  950 MG tablet, Take 1 tablet by mouth 2 (two) times daily. , Disp: , Rfl:  .  celecoxib (CELEBREX) 200 MG capsule, Take 200 mg by mouth daily., Disp: , Rfl:  .  nitroGLYCERIN (NITROSTAT) 0.4 MG SL tablet, DISSOLVE ONE TABLET UNDER THE TONGUE EVERY 5 MINUTES AS NEEDED FOR CHEST PAIN. (Patient not taking: Reported on 04/04/2018), Disp: 25 tablet, Rfl: 3 .  traMADol (ULTRAM) 50 MG tablet, Take 50 mg by mouth every 8 (eight) hours as needed. for pain, Disp: , Rfl: 0 Allergies  Allergen Reactions  . Sulfa Antibiotics Rash  . Uloric [Febuxostat] Rash      Social History   Socioeconomic History  . Marital status: Married    Spouse name: Not on file  . Number of children: Not on file  . Years of education: Not on file  . Highest education level: Not  on file  Occupational History  . Not on file  Social Needs  . Financial resource strain: Not on file  . Food insecurity:    Worry: Not on file    Inability: Not on file  . Transportation needs:    Medical: Not on file    Non-medical: Not on file  Tobacco Use  . Smoking status: Former Smoker    Packs/day: 1.00    Years: 22.00    Pack years: 22.00    Types: Cigarettes    Last attempt to quit: 1983    Years since quitting: 36.9  . Smokeless tobacco: Never Used  Substance and Sexual Activity  . Alcohol use: Yes    Comment: ocassional  . Drug use: No  . Sexual activity: Never    Birth control/protection: None, Post-menopausal  Lifestyle  . Physical activity:    Days per week: Not on file    Minutes per session: Not on file  . Stress: Not on file  Relationships  . Social connections:    Talks on phone: Not on file    Gets together: Not on file    Attends religious service: Not on file    Active member of club or organization: Not on file    Attends meetings of clubs or organizations: Not on file    Relationship status: Not on file  . Intimate partner violence:    Fear of current or ex partner: Not on file    Emotionally abused: Not on file    Physically abused: Not on file    Forced sexual activity: Not on file  Other Topics Concern  . Not on file  Social History Narrative  . Not on file    Physical Exam  Constitutional: She is oriented to person, place, and time.  Cardiovascular: Normal rate and regular rhythm.  Pulmonary/Chest: Effort normal and breath sounds normal.  Abdominal: Soft.  Musculoskeletal: Normal range of motion. She exhibits edema.  Neurological: She is alert and oriented to person, place, and time.  Skin: Skin is warm and dry.  Psychiatric: She has a normal mood and affect.        Future Appointments  Date Time Provider [place]  05/07/2018 10:00 AM [facility] PA/NP [facility] None  05/08/2018  9:30 AM [facility] PAT 1 [facility] None   05/20/2018  8:10 AM [facility] DEVICE REMOTES [facility]  05/30/2018 12:00 PM [place], Stephani Police, DPM [facility]    BP (!) 108/58 (BP Location: Right Arm, Patient Position: Sitting, Cuff Size: Large)   Pulse 64   Resp 16   Wt 202 lb 6.4  oz (91.8 kg)   SpO2 97%   BMI 34.74 kg/m   Weight yesterday- Did not weigh Last visit weight- 199 lb  Ms Pore was sen at home today and reported feeling well. She state denied chest pain, SOB, headache, dizziness or orthopnea. She stated she has been compliant with her medications over the past week and her weight has been stable. Her medications were verified and her pillbox was refilled.   [name], EMT 04/25/18  ACTION: Home visit completed Next visit planned for 1 week

## 2018-04-27 DIAGNOSIS — G4733 Obstructive sleep apnea (adult) (pediatric): Secondary | ICD-10-CM | POA: Diagnosis not present

## 2018-04-30 ENCOUNTER — Other Ambulatory Visit: Payer: Self-pay | Admitting: Orthopedic Surgery

## 2018-05-02 ENCOUNTER — Other Ambulatory Visit (HOSPITAL_COMMUNITY): Payer: Self-pay

## 2018-05-02 NOTE — Progress Notes (Signed)
Paramedicine Encounter    Patient ID: Danielle [name], female    DOB: [birth year], 72 y.o.   MRN: [id number]   Patient Care Team: Seward Carol, MD as PCP - General (Internal Medicine)  Patient Active Problem List   Diagnosis Date Noted  . (HFpEF) heart failure with preserved ejection fraction ([place]) 12/20/2017  . Obesity (BMI 30-39.9) 11/11/2017  . OSA (obstructive sleep apnea) 02/18/2017  . Essential hypertension   . Diabetes mellitus without complication ([place])   . Cancer ([place])   . Thyroid disease     Current Outpatient Medications:  .  acetaminophen (TYLENOL) 650 MG CR tablet, Take 650 mg by mouth every 8 (eight) hours as needed for pain., Disp: , Rfl:  .  allopurinol (ZYLOPRIM) 100 MG tablet, Take 1 tablet (100 mg total) by mouth daily., Disp: , Rfl:  .  apixaban (ELIQUIS) 5 MG TABS tablet, Take 1 tablet (5 mg total) by mouth 2 (two) times daily., Disp: 60 tablet, Rfl: 3 .  atorvastatin (LIPITOR) 80 MG tablet, Take 1 tablet (80 mg total) by mouth at bedtime., Disp: 30 tablet, Rfl: 11 .  Biotin 10 MG CAPS, Take 10 mg by mouth daily., Disp: , Rfl:  .  carvedilol (COREG) 25 MG tablet, Take 1 tablet (25 mg total) by mouth 2 (two) times daily., Disp: 180 tablet, Rfl: 3 .  CHONDROITIN SULFATE PO, Take 250 mg by mouth daily., Disp: , Rfl:  .  empagliflozin (JARDIANCE) 10 MG TABS tablet, Take 10 mg by mouth daily., Disp: 30 tablet, Rfl: 11 .  ferrous sulfate 325 (65 FE) MG tablet, Take 325 mg by mouth 2 (two) times daily with a meal., Disp: , Rfl:  .  gabapentin (NEURONTIN) 300 MG capsule, Take 300 mg by mouth 3 (three) times daily., Disp: , Rfl:  .  insulin glargine (LANTUS) 100 UNIT/ML injection, Inject 30 Units into the skin 2 (two) times daily. , Disp: , Rfl:  .  insulin lispro (HUMALOG) 100 UNIT/ML injection, Inject 5 Units into the skin daily with lunch. , Disp: , Rfl:  .  isosorbide mononitrate (IMDUR) 30 MG 24 hr tablet, Take 1 tablet (30 mg total) by mouth daily., Disp: 30 tablet,  Rfl: 3 .  Lactobacillus (ACIDOPHILUS PO), Take 1 capsule by mouth daily., Disp: , Rfl:  .  levothyroxine (SYNTHROID, LEVOTHROID) 75 MCG tablet, Take 75 mcg by mouth daily before breakfast., Disp: , Rfl:  .  losartan (COZAAR) 50 MG tablet, Take 1 tablet (50 mg total) by mouth daily., Disp: 30 tablet, Rfl: 3 .  metFORMIN (GLUCOPHAGE) 500 MG tablet, Take 500 mg by mouth 2 (two) times daily with a meal. , Disp: , Rfl:  .  Multiple Vitamins-Minerals (ALIVE ONCE DAILY WOMENS 50+ PO), Take 1 tablet by mouth daily., Disp: , Rfl:  .  omeprazole (PRILOSEC) 20 MG capsule, Take 20 mg by mouth daily. , Disp: , Rfl:  .  potassium chloride (K-DUR,KLOR-CON) 10 MEQ tablet, Take 2 tablets (20 mEq total) by mouth daily., Disp: 60 tablet, Rfl: 5 .  spironolactone (ALDACTONE) 25 MG tablet, Take 1 tablet (25 mg total) by mouth every other day., Disp: 15 tablet, Rfl: 5 .  torsemide (DEMADEX) 20 MG tablet, Take 1 tablet (20 mg total) by mouth 2 (two) times daily., Disp: 60 tablet, Rfl: 11 .  Travoprost, BAK Free, (TRAVATAN) 0.004 % SOLN ophthalmic solution, Place 1 drop into both eyes daily., Disp: , Rfl:  .  calcium citrate (CALCITRATE - DOSED IN MG ELEMENTAL CALCIUM)  950 MG tablet, Take 1 tablet by mouth 2 (two) times daily. , Disp: , Rfl:  .  celecoxib (CELEBREX) 200 MG capsule, Take 200 mg by mouth daily., Disp: , Rfl:  .  nitroGLYCERIN (NITROSTAT) 0.4 MG SL tablet, DISSOLVE ONE TABLET UNDER THE TONGUE EVERY 5 MINUTES AS NEEDED FOR CHEST PAIN. (Patient not taking: Reported on 04/04/2018), Disp: 25 tablet, Rfl: 3 .  traMADol (ULTRAM) 50 MG tablet, Take 50 mg by mouth every 8 (eight) hours as needed. for pain, Disp: , Rfl: 0 Allergies  Allergen Reactions  . Sulfa Antibiotics Rash  . Uloric [Febuxostat] Rash      Social History   Socioeconomic History  . Marital status: Married    Spouse name: Not on file  . Number of children: Not on file  . Years of education: Not on file  . Highest education level: Not on  file  Occupational History  . Not on file  Social Needs  . Financial resource strain: Not on file  . Food insecurity:    Worry: Not on file    Inability: Not on file  . Transportation needs:    Medical: Not on file    Non-medical: Not on file  Tobacco Use  . Smoking status: Former Smoker    Packs/day: 1.00    Years: 22.00    Pack years: 22.00    Types: Cigarettes    Last attempt to quit: 1983    Years since quitting: 36.9  . Smokeless tobacco: Never Used  Substance and Sexual Activity  . Alcohol use: Yes    Comment: ocassional  . Drug use: No  . Sexual activity: Never    Birth control/protection: None, Post-menopausal  Lifestyle  . Physical activity:    Days per week: Not on file    Minutes per session: Not on file  . Stress: Not on file  Relationships  . Social connections:    Talks on phone: Not on file    Gets together: Not on file    Attends religious service: Not on file    Active member of club or organization: Not on file    Attends meetings of clubs or organizations: Not on file    Relationship status: Not on file  . Intimate partner violence:    Fear of current or ex partner: Not on file    Emotionally abused: Not on file    Physically abused: Not on file    Forced sexual activity: Not on file  Other Topics Concern  . Not on file  Social History Narrative  . Not on file    Physical Exam  Constitutional: She is oriented to person, place, and time.  Cardiovascular: Normal rate and regular rhythm.  Pulmonary/Chest: Effort normal and breath sounds normal.  Abdominal: Soft.  Musculoskeletal: Normal range of motion. She exhibits edema.  Neurological: She is alert and oriented to person, place, and time.  Skin: Skin is warm and dry.  Psychiatric: She has a normal mood and affect.        Future Appointments  Date Time Provider [place]  05/07/2018 10:00 AM [facility] PA/NP [facility] None  05/08/2018  9:30 AM [facility] PAT 1 [facility] None   05/20/2018  8:10 AM [facility] DEVICE REMOTES [facility]  05/30/2018 12:00 PM [place], Stephani Police, DPM [facility]    BP 108/60 (BP Location: Right Arm, Patient Position: Sitting, Cuff Size: Large)   Pulse 68   Resp 16   Wt 202 lb 6.4 oz (  91.8 kg)   SpO2 97%   BMI 34.74 kg/m   Weight yesterday- 200.4 lb Last visit weight-   Ms Barrie was seen at home today and reported feeling generally well with the exception of back pain. She stated she has been compliant with her medications and her weight has been stable. Her medications were verified and her pillbox was refilled. All necessary refills were ordered and she was left with written, detailed instructions on where to put Eliquis when she picks it up.   [name], EMT 05/02/18  ACTION: Home visit completed Next visit planned for 1 week

## 2018-05-06 NOTE — Progress Notes (Signed)
Advanced Heart Failure Clinic Note    Primary Care: Dr. Delfina Redwood Primary Cardiologist: Dr. Haroldine Laws  EP: Dr Tomasa Blase  HPI:  Danielle [name] is a 72 y.o. [name] with a past medical history of systolic CHF (EF 25-00%), CAD, ischemic cardiomyopathy, HTN, DM, hypothyroidism, and OSA sleep study 03/2017.   Admitted 02/03/17-02/06/17 with SOB, acute on chronic systolic CHF. Diuresed with IV lasix (12 pounds), discharge weight was 215 pounds.   She presents today for regular follow up. She is feeling good overall. She is excited and nervous for her knee replacement surgery, scheduled for next week at [facility]. Denies palpitations, SOB, or CP. Mostly remains limited by knee pain. Denies bleeding on Eliquis. She wears her CPAP every night. Weight stable at home ~200 lbs. Watches her salt and fluid intake. Paramedicine following.   ECHO 01/2017- EF 45-50% Grade 2DD Left Atrium Severely dilated.  ECHO 12/2017 EF 45-50%, G2DD, LA mildly dilated, trileaflet moderately thick aortic valve Labs  11/29/17 k 4.3, cr 1.47   Review of systems complete and found to be negative unless listed in HPI.    Past Medical History:  Diagnosis Date  . Acute combined systolic and diastolic congestive heart failure ([place]) 02/05/2017  . Arthritis   . Blood dyscrasia    per pt-has small blood cells-appears as if anemic  . Breast cancer ([place])   . Cancer (HCC)    breast  . CHF (congestive heart failure) ([place])   . Complication of anesthesia    difficult to awaken from per pt  . Diabetes mellitus without complication ([place])   . Dyspnea   . Essential hypertension   . Heart disease   . Hypothyroidism   . Obesity (BMI 30-39.9) 11/11/2017  . OSA (obstructive sleep apnea) 02/18/2017    severe obstructive sleep apnea with an AHI of 75.6/h and mild central sleep apnea with a CAI of 7.7/h.  Oxygen saturations dropped as low as 82%.   He is on CPAP at 9 cm H2O.  . Personal history of chemotherapy   . Personal history of radiation  therapy   . Presence of permanent cardiac pacemaker   . Sleep apnea   . Thyroid disease    hypothyroid    Current Outpatient Medications  Medication Sig Dispense Refill  . acetaminophen (TYLENOL) 650 MG CR tablet Take 650 mg by mouth every 8 (eight) hours as needed for pain.    Marland Kitchen allopurinol (ZYLOPRIM) 100 MG tablet Take 1 tablet (100 mg total) by mouth daily.    Marland Kitchen apixaban (ELIQUIS) 5 MG TABS tablet Take 1 tablet (5 mg total) by mouth 2 (two) times daily. 60 tablet 3  . atorvastatin (LIPITOR) 80 MG tablet Take 1 tablet (80 mg total) by mouth at bedtime. 30 tablet 11  . Biotin 10 MG CAPS Take 10 mg by mouth daily.    . calcium citrate (CALCITRATE - DOSED IN MG ELEMENTAL CALCIUM) 950 MG tablet Take 1 tablet by mouth 2 (two) times daily.     . carvedilol (COREG) 25 MG tablet Take 1 tablet (25 mg total) by mouth 2 (two) times daily. 180 tablet 3  . celecoxib (CELEBREX) 200 MG capsule Take 200 mg by mouth daily.    . CHONDROITIN SULFATE PO Take 250 mg by mouth daily.    . empagliflozin (JARDIANCE) 10 MG TABS tablet Take 10 mg by mouth daily. 30 tablet 11  . ferrous sulfate 325 (65 FE) MG tablet Take 325 mg by mouth 2 (two) times daily with  a meal.    . gabapentin (NEURONTIN) 300 MG capsule Take 300 mg by mouth 3 (three) times daily.    . insulin glargine (LANTUS) 100 UNIT/ML injection Inject 30 Units into the skin 2 (two) times daily.     . isosorbide mononitrate (IMDUR) 30 MG 24 hr tablet Take 1 tablet (30 mg total) by mouth daily. 30 tablet 3  . Lactobacillus (ACIDOPHILUS PO) Take 1 capsule by mouth daily.    Marland Kitchen levothyroxine (SYNTHROID, LEVOTHROID) 75 MCG tablet Take 75 mcg by mouth daily before breakfast.    . losartan (COZAAR) 50 MG tablet Take 1 tablet (50 mg total) by mouth daily. 30 tablet 3  . metFORMIN (GLUCOPHAGE) 500 MG tablet Take 500 mg by mouth 2 (two) times daily with a meal.     . Multiple Vitamins-Minerals (ALIVE ONCE DAILY WOMENS 50+ PO) Take 1 tablet by mouth daily.    .  nitroGLYCERIN (NITROSTAT) 0.4 MG SL tablet DISSOLVE ONE TABLET UNDER THE TONGUE EVERY 5 MINUTES AS NEEDED FOR CHEST PAIN. 25 tablet 3  . omeprazole (PRILOSEC) 20 MG capsule Take 20 mg by mouth daily.     . potassium chloride (K-DUR,KLOR-CON) 10 MEQ tablet Take 2 tablets (20 mEq total) by mouth daily. 60 tablet 5  . spironolactone (ALDACTONE) 25 MG tablet Take 1 tablet (25 mg total) by mouth every other day. 15 tablet 5  . torsemide (DEMADEX) 20 MG tablet Take 1 tablet (20 mg total) by mouth 2 (two) times daily. 60 tablet 11  . traMADol (ULTRAM) 50 MG tablet Take 50 mg by mouth every 8 (eight) hours as needed. for pain  0  . Travoprost, BAK Free, (TRAVATAN) 0.004 % SOLN ophthalmic solution Place 1 drop into both eyes daily.    . insulin lispro (HUMALOG) 100 UNIT/ML injection Inject 5 Units into the skin daily with lunch.      No current facility-administered medications for this encounter.    Allergies  Allergen Reactions  . Sulfa Antibiotics Rash  . Uloric [Febuxostat] Rash   Social History   Socioeconomic History  . Marital status: Married    Spouse name: Not on file  . Number of children: Not on file  . Years of education: Not on file  . Highest education level: Not on file  Occupational History  . Not on file  Social Needs  . Financial resource strain: Not on file  . Food insecurity:    Worry: Not on file    Inability: Not on file  . Transportation needs:    Medical: Not on file    Non-medical: Not on file  Tobacco Use  . Smoking status: Former Smoker    Packs/day: 1.00    Years: 22.00    Pack years: 22.00    Types: Cigarettes    Last attempt to quit: 1983    Years since quitting: 36.9  . Smokeless tobacco: Never Used  Substance and Sexual Activity  . Alcohol use: Yes    Comment: ocassional  . Drug use: No  . Sexual activity: Never    Birth control/protection: None, Post-menopausal  Lifestyle  . Physical activity:    Days per week: Not on file    Minutes per  session: Not on file  . Stress: Not on file  Relationships  . Social connections:    Talks on phone: Not on file    Gets together: Not on file    Attends religious service: Not on file    Active member of  club or organization: Not on file    Attends meetings of clubs or organizations: Not on file    Relationship status: Not on file  . Intimate partner violence:    Fear of current or ex partner: Not on file    Emotionally abused: Not on file    Physically abused: Not on file    Forced sexual activity: Not on file  Other Topics Concern  . Not on file  Social History Narrative  . Not on file   Family History  Problem Relation Age of Onset  . Multiple myeloma Mother   . Heart disease Father   . Other Sister   . Heart disease Brother   . Diabetes Sister   . Other Sister    Vitals:   05/07/18 1023  BP: 140/84  Pulse: 74  SpO2: 98%  Weight: 92.9 kg (204 lb 12.8 oz)     Wt Readings from Last 3 Encounters:  05/07/18 92.9 kg (204 lb 12.8 oz)  05/02/18 91.8 kg (202 lb 6.4 oz)  04/25/18 91.8 kg (202 lb 6.4 oz)    PHYSICAL EXAM: General: Well appearing. No resp difficulty. HEENT: Normal Neck: Supple. JVP 5-6. Carotids 2+ bilat; no bruits. No thyromegaly or nodule noted. Cor: PMI nondisplaced. RRR, 2/6 TR noted Lungs: CTAB, normal effort. Abdomen: Soft, non-tender, non-distended, no HSM. No bruits or masses. +BS  Extremities: No cyanosis, clubbing, or rash. R and LLE no edema.  Neuro: Alert & orientedx3, cranial nerves grossly intact. moves all 4 extremities w/o difficulty. Affect pleasant   ASSESSMENT & PLAN:  1. Chronic systolic CHF: ICM, Echo 09/4006 EF 45-50%, G2DD, LA mildly dilated, trileaflet moderately thick aortic valve - NYHA II-III symptoms, confounded by Arthritis. - Volume status stable on exam.   - Continue torsemide 3m BID. BMET today.  - Continue imdur 30 mg daily - Continue losartan 50 mg daily. - Continue 25 mg spironolactone daily.  - Continue  Coreg 25 mg twice a day. - Continue jardiance 10 mg daily.  - Reinforced fluid restriction to < 2 L daily, sodium restriction to less than 2000 mg daily, and the importance of daily weights.   - Continue HF paramedicine.  2. CAD: [place] in 2010, 2012 and 2016. [place] No s/s of ischemia.    - Continue statin. No ASA with Eliquis.  3. Afib (new, as of 02/26/18) - Continue Eliquis 5 mg BID. Denies bleeding.  Ok to hold for knee surgery.  - Following in Afib clinic.  - Regular on exam. No Afib on device.   4. HTN - Meds as above.   5. DM - Per Dr. PDelfina Redwood  - Continue jardiance.   6. PPM - Dr. CCurt Bearsnow following. No change.   7. Gout - Per PCP.  - Has follow up with Rheumatology   8. Arthritis - Continue tylenol. Per PCP - She is scheduled for Total Knee Arthroplasty next week.   9. OSA-  - Reports using her cpap nightly. No change.  - Follows with Dr TLynnea Maizes PA-C 05/07/18   Greater than 50% of the 25 minute visit was spent in counseling/coordination of care regarding disease state education, salt/fluid restriction, sliding scale diuretics, and medication compliance.

## 2018-05-07 ENCOUNTER — Encounter (HOSPITAL_COMMUNITY): Payer: Self-pay

## 2018-05-07 ENCOUNTER — Ambulatory Visit (HOSPITAL_COMMUNITY)
Admission: RE | Admit: 2018-05-07 | Discharge: 2018-05-07 | Disposition: A | Discharge status: Home / Self Care | Payer: Medicare HMO | Source: Ambulatory Visit | Attending: Cardiology | Admitting: Cardiology

## 2018-05-07 VITALS — BP 140/84 | HR 74 | Wt 204.8 lb

## 2018-05-07 DIAGNOSIS — E118 Type 2 diabetes mellitus with unspecified complications: Secondary | ICD-10-CM

## 2018-05-07 DIAGNOSIS — I480 Paroxysmal atrial fibrillation: Secondary | ICD-10-CM

## 2018-05-07 DIAGNOSIS — Z882 Allergy status to sulfonamides status: Secondary | ICD-10-CM | POA: Insufficient documentation

## 2018-05-07 DIAGNOSIS — E039 Hypothyroidism, unspecified: Secondary | ICD-10-CM | POA: Diagnosis not present

## 2018-05-07 DIAGNOSIS — E669 Obesity, unspecified: Secondary | ICD-10-CM | POA: Insufficient documentation

## 2018-05-07 DIAGNOSIS — I110 Hypertensive heart disease with heart failure: Secondary | ICD-10-CM | POA: Diagnosis not present

## 2018-05-07 DIAGNOSIS — Z87891 Personal history of nicotine dependence: Secondary | ICD-10-CM | POA: Diagnosis not present

## 2018-05-07 DIAGNOSIS — Z950 Presence of cardiac pacemaker: Secondary | ICD-10-CM | POA: Diagnosis not present

## 2018-05-07 DIAGNOSIS — I5042 Chronic combined systolic (congestive) and diastolic (congestive) heart failure: Secondary | ICD-10-CM | POA: Insufficient documentation

## 2018-05-07 DIAGNOSIS — M1990 Unspecified osteoarthritis, unspecified site: Secondary | ICD-10-CM | POA: Insufficient documentation

## 2018-05-07 DIAGNOSIS — Z79899 Other long term (current) drug therapy: Secondary | ICD-10-CM | POA: Diagnosis not present

## 2018-05-07 DIAGNOSIS — I2510 Atherosclerotic heart disease of native coronary artery without angina pectoris: Secondary | ICD-10-CM | POA: Diagnosis not present

## 2018-05-07 DIAGNOSIS — I495 Sick sinus syndrome: Secondary | ICD-10-CM | POA: Diagnosis not present

## 2018-05-07 DIAGNOSIS — Z7901 Long term (current) use of anticoagulants: Secondary | ICD-10-CM | POA: Insufficient documentation

## 2018-05-07 DIAGNOSIS — E119 Type 2 diabetes mellitus without complications: Secondary | ICD-10-CM | POA: Insufficient documentation

## 2018-05-07 DIAGNOSIS — Z794 Long term (current) use of insulin: Secondary | ICD-10-CM | POA: Diagnosis not present

## 2018-05-07 DIAGNOSIS — Z853 Personal history of malignant neoplasm of breast: Secondary | ICD-10-CM | POA: Diagnosis not present

## 2018-05-07 DIAGNOSIS — I4891 Unspecified atrial fibrillation: Secondary | ICD-10-CM | POA: Diagnosis not present

## 2018-05-07 DIAGNOSIS — M109 Gout, unspecified: Secondary | ICD-10-CM | POA: Insufficient documentation

## 2018-05-07 DIAGNOSIS — G4733 Obstructive sleep apnea (adult) (pediatric): Secondary | ICD-10-CM | POA: Insufficient documentation

## 2018-05-07 DIAGNOSIS — I10 Essential (primary) hypertension: Secondary | ICD-10-CM

## 2018-05-07 NOTE — Patient Instructions (Signed)
Your physician recommends that you schedule a follow-up appointment in: 3 months with Dr. Haroldine Laws

## 2018-05-07 NOTE — Patient Instructions (Addendum)
Danielle [name]  05/07/2018   Your procedure is scheduled on: 05-13-18  Report to [facility] Main  Entrance  Report to admitting at      0530 AM                                                        Bring cpap mask and tubing only    Call this number if you have problems the morning of surgery [phone]    Remember: Do not eat food or drink liquids :After Midnight.   BRUSH YOUR TEETH MORNING OF SURGERY AND RINSE YOUR MOUTH OUT, NO CHEWING GUM CANDY OR MINTS.     Take these medicines the morning of surgery with A SIP OF WATER:  DO NOT TAKE ANY DIABETIC MEDICATIONS DAY OF YOUR SURGERY   How to Manage Your Diabetes Before and After Surgery  Why is it important to control my blood sugar before and after surgery? . Improving blood sugar levels before and after surgery helps healing and can limit problems. . A way of improving blood sugar control is eating a healthy diet by: o  Eating less sugar and carbohydrates o  Increasing activity/exercise o  Talking with your doctor about reaching your blood sugar goals . High blood sugars (greater than 180 mg/dL) can raise your risk of infections and slow your recovery, so you will need to focus on controlling your diabetes during the weeks before surgery. . Make sure that the doctor who takes care of your diabetes knows about your planned surgery including the date and location.  How do I manage my blood sugar before surgery? . Check your blood sugar at least 4 times a day, starting 2 days before surgery, to make sure that the level is not too high or low. o Check your blood sugar the morning of your surgery when you wake up and every 2 hours until you get to the Short Stay unit. . If your blood sugar is less than 70 mg/dL, you will need to treat for low blood sugar: o Do not take insulin. o Treat a low blood sugar (less than 70 mg/dL) with  cup of clear juice (cranberry or apple), 4 glucose tablets, OR glucose  gel. o Recheck blood sugar in 15 minutes after treatment (to make sure it is greater than 70 mg/dL). If your blood sugar is not greater than 70 mg/dL on recheck, call [phone] for further instructions. . Report your blood sugar to the short stay nurse when you get to Short Stay.  . If you are admitted to the hospital after surgery: o Your blood sugar will be checked by the staff and you will probably be given insulin after surgery (instead of oral diabetes medicines) to make sure you have good blood sugar levels. o The goal for blood sugar control after surgery is 80-180 mg/dL.   WHAT DO I DO ABOUT MY DIABETES MEDICATION?  Marland Kitchen Do not take oral diabetes medicines (pills) the morning of surgery.  . THE NIGHT BEFORE SURGERY, take   50%  of Lantus insulin dose      insulin.       . THE MORNING OF SURGERY, take  0  units of  insulin.  . The day of surgery, do not take other diabetes injectables, including Byetta (exenatide), Bydureon (exenatide ER), Victoza (liraglutide), or Trulicity (dulaglutide).  . If your CBG is greater than 220 mg/dL, you may take  of your sliding scale  . (correction) dose of insulin.  Patient Signature:  Date:   Nurse Signature:  Date:   Reviewed and Endorsed by [facility] Patient Education Committee, August 2015                              You may not have any metal on your body including hair pins and              piercings  Do not wear jewelry, make-up, lotions, powders or perfumes, deodorant             Do not wear nail polish.  Do not shave  48 hours prior to surgery.                Do not bring valuables to the hospital. [place].  Contacts, dentures or bridgework may not be worn into surgery.  Leave suitcase in the car. After surgery it may be brought to your room.                 Please read over the following fact sheets you were  given: _____________________________________________________________________           [facility] - Preparing for Surgery Before surgery, you can play an important role.  Because skin is not sterile, your skin needs to be as free of germs as possible.  You can reduce the number of germs on your skin by washing with CHG (chlorahexidine gluconate) soap before surgery.  CHG is an antiseptic cleaner which kills germs and bonds with the skin to continue killing germs even after washing. Please DO NOT use if you have an allergy to CHG or antibacterial soaps.  If your skin becomes reddened/irritated stop using the CHG and inform your nurse when you arrive at Short Stay. Do not shave (including legs and underarms) for at least 48 hours prior to the first CHG shower.  You may shave your face/neck. Please follow these instructions carefully:  1.  Shower with CHG Soap the night before surgery and the  morning of Surgery.  2.  If you choose to wash your hair, wash your hair first as usual with your  normal  shampoo.  3.  After you shampoo, rinse your hair and body thoroughly to remove the  shampoo.                           4.  Use CHG as you would any other liquid soap.  You can apply chg directly  to the skin and wash                       Gently with a scrungie or clean washcloth.  5.  Apply the CHG Soap to your body ONLY FROM THE NECK DOWN.   Do not use on face/ open                           Wound or open sores. Avoid contact with eyes, ears  mouth and genitals (private parts).                       Wash face,  Genitals (private parts) with your normal soap.             6.  Wash thoroughly, paying special attention to the area where your surgery  will be performed.  7.  Thoroughly rinse your body with warm water from the neck down.  8.  DO NOT shower/wash with your normal soap after using and rinsing off  the CHG Soap.                9.  Pat yourself dry with a clean towel.            10.  Wear clean  pajamas.            11.  Place clean sheets on your bed the night of your first shower and do not  sleep with pets. Day of Surgery : Do not apply any lotions/deodorants the morning of surgery.  Please wear clean clothes to the hospital/surgery center.  FAILURE TO FOLLOW THESE INSTRUCTIONS MAY RESULT IN THE CANCELLATION OF YOUR SURGERY PATIENT SIGNATURE_________________________________  NURSE SIGNATURE__________________________________  ________________________________________________________________________   Adam Phenix  An incentive spirometer is a tool that can help keep your lungs clear and active. This tool measures how well you are filling your lungs with each breath. Taking long deep breaths may help reverse or decrease the chance of developing breathing (pulmonary) problems (especially infection) following:  A long period of time when you are unable to move or be active. BEFORE THE PROCEDURE   If the spirometer includes an indicator to show your best effort, your nurse or respiratory therapist will set it to a desired goal.  If possible, sit up straight or lean slightly forward. Try not to slouch.  Hold the incentive spirometer in an upright position. INSTRUCTIONS FOR USE  1. Sit on the edge of your bed if possible, or sit up as far as you can in bed or on a chair. 2. Hold the incentive spirometer in an upright position. 3. Breathe out normally. 4. Place the mouthpiece in your mouth and seal your lips tightly around it. 5. Breathe in slowly and as deeply as possible, raising the piston or the ball toward the top of the column. 6. Hold your breath for 3-5 seconds or for as long as possible. Allow the piston or ball to fall to the bottom of the column. 7. Remove the mouthpiece from your mouth and breathe out normally. 8. Rest for a few seconds and repeat Steps 1 through 7 at least 10 times every 1-2 hours when you are awake. Take your time and take a few normal breaths  between deep breaths. 9. The spirometer may include an indicator to show your best effort. Use the indicator as a goal to work toward during each repetition. 10. After each set of 10 deep breaths, practice coughing to be sure your lungs are clear. If you have an incision (the cut made at the time of surgery), support your incision when coughing by placing a pillow or rolled up towels firmly against it. Once you are able to get out of bed, walk around indoors and cough well. You may stop using the incentive spirometer when instructed by your caregiver.  RISKS AND COMPLICATIONS  Take your time so you do not get dizzy or light-headed.  If you are in  pain, you may need to take or ask for pain medication before doing incentive spirometry. It is harder to take a deep breath if you are having pain. AFTER USE  Rest and breathe slowly and easily.  It can be helpful to keep track of a log of your progress. Your caregiver can provide you with a simple table to help with this. If you are using the spirometer at home, follow these instructions: [place] IF:   You are having difficultly using the spirometer.  You have trouble using the spirometer as often as instructed.  Your pain medication is not giving enough relief while using the spirometer.  You develop fever of 100.5 F (38.1 C) or higher. SEEK IMMEDIATE MEDICAL CARE IF:   You cough up bloody sputum that had not been present before.  You develop fever of 102 F (38.9 C) or greater.  You develop worsening pain at or near the incision site. MAKE SURE YOU:   Understand these instructions.  Will watch your condition.  Will get help right away if you are not doing well or get worse. Document Released: 09/24/2006 Document Revised: 08/06/2011 Document Reviewed: 11/25/2006 [facility] Patient Information 2014 [place], [place].   ________________________________________________________________________

## 2018-05-07 NOTE — Progress Notes (Addendum)
Clearance note found in echo report 01-09-18 epic  ekg 04-03-18 epic  Last device check 02-18-18 epic  [facility] 05-06-18 epic

## 2018-05-08 ENCOUNTER — Other Ambulatory Visit: Payer: Self-pay

## 2018-05-08 ENCOUNTER — Other Ambulatory Visit: Payer: Self-pay | Admitting: Orthopedic Surgery

## 2018-05-08 ENCOUNTER — Encounter (HOSPITAL_COMMUNITY): Payer: Self-pay

## 2018-05-08 ENCOUNTER — Encounter (HOSPITAL_COMMUNITY)
Admission: RE | Admit: 2018-05-08 | Discharge: 2018-05-08 | Disposition: A | Discharge status: Home / Self Care | Payer: Medicare HMO | Source: Ambulatory Visit | Attending: Orthopedic Surgery | Admitting: Orthopedic Surgery

## 2018-05-08 DIAGNOSIS — M1711 Unilateral primary osteoarthritis, right knee: Secondary | ICD-10-CM | POA: Insufficient documentation

## 2018-05-08 DIAGNOSIS — Z01812 Encounter for preprocedural laboratory examination: Secondary | ICD-10-CM | POA: Diagnosis not present

## 2018-05-08 HISTORY — DX: Atherosclerotic heart disease of native coronary artery without angina pectoris: I25.10

## 2018-05-08 LAB — SURGICAL PCR SCREEN
MRSA, PCR: NEGATIVE
Staphylococcus aureus: NEGATIVE

## 2018-05-08 LAB — BASIC METABOLIC PANEL
Anion gap: 13 (ref 5–15)
BUN: 39 mg/dL — ABNORMAL HIGH (ref 8–23)
CO2: 25 mmol/L (ref 22–32)
Calcium: 9.4 mg/dL (ref 8.9–10.3)
Chloride: 101 mmol/L (ref 98–111)
Creatinine, Ser: 1.75 mg/dL — ABNORMAL HIGH (ref 0.44–1.00)
GFR calc Af Amer: 33 mL/min — ABNORMAL LOW (ref >60)
GFR calc non Af Amer: 29 mL/min — ABNORMAL LOW (ref >60)
Glucose, Bld: 222 mg/dL — ABNORMAL HIGH (ref 70–99)
Potassium: 4.1 mmol/L (ref 3.5–5.1)
Sodium: 139 mmol/L (ref 135–145)

## 2018-05-08 LAB — NO BLOOD PRODUCTS

## 2018-05-08 LAB — CBC
HCT: 31.7 % — ABNORMAL LOW (ref 36.0–46.0)
Hemoglobin: 9.4 g/dL — ABNORMAL LOW (ref 12.0–15.0)
MCH: 20.5 pg — AB (ref 26.0–34.0)
MCHC: 29.7 g/dL — ABNORMAL LOW (ref 30.0–36.0)
MCV: 69.1 fL — ABNORMAL LOW (ref 80.0–100.0)
Platelets: 196 10*3/uL (ref 150–400)
RBC: 4.59 MIL/uL (ref 3.87–5.11)
RDW: 17.2 % — ABNORMAL HIGH (ref 11.5–15.5)
WBC: 6.7 10*3/uL (ref 4.0–10.5)
nRBC: 0 % (ref 0.0–0.2)

## 2018-05-08 NOTE — Progress Notes (Signed)
Cardiac and medical clearance on chart Dr. Haroldine Laws  And dr. Delfina Redwood 03-26-18

## 2018-05-08 NOTE — Addendum Note (Signed)
Encounter addended by: Jorge Ny, LCSW on: 05/08/2018 7:35 AM  Actions taken: Visit Navigator Flowsheet section accepted

## 2018-05-08 NOTE — Progress Notes (Signed)
Dr. Luanna Cole office faxed no blood refusal form

## 2018-05-09 ENCOUNTER — Other Ambulatory Visit (HOSPITAL_COMMUNITY): Payer: Self-pay

## 2018-05-09 NOTE — Progress Notes (Signed)
Paramedicine Encounter    Patient ID: Danielle [name], female    DOB: [birth year], 72 y.o.   MRN: [id number]   Patient Care Team: Seward Carol, MD as PCP - General (Internal Medicine) Jorge Ny, LCSW as Social Worker (Licensed Clinical Social Worker)  Patient Active Problem List   Diagnosis Date Noted  . (HFpEF) heart failure with preserved ejection fraction ([place]) 12/20/2017  . Obesity (BMI 30-39.9) 11/11/2017  . OSA (obstructive sleep apnea) 02/18/2017  . Essential hypertension   . Diabetes mellitus without complication ([place])   . Cancer ([place])   . Thyroid disease     Current Outpatient Medications:  .  allopurinol (ZYLOPRIM) 100 MG tablet, Take 1 tablet (100 mg total) by mouth daily., Disp: , Rfl:  .  apixaban (ELIQUIS) 5 MG TABS tablet, Take 1 tablet (5 mg total) by mouth 2 (two) times daily., Disp: 60 tablet, Rfl: 3 .  atorvastatin (LIPITOR) 80 MG tablet, Take 1 tablet (80 mg total) by mouth at bedtime., Disp: 30 tablet, Rfl: 11 .  Biotin 10 MG CAPS, Take 10 mg by mouth daily., Disp: , Rfl:  .  carvedilol (COREG) 25 MG tablet, Take 1 tablet (25 mg total) by mouth 2 (two) times daily., Disp: 180 tablet, Rfl: 3 .  celecoxib (CELEBREX) 200 MG capsule, Take 200 mg by mouth daily as needed for mild pain. , Disp: , Rfl:  .  CHONDROITIN SULFATE PO, Take 250 mg by mouth daily., Disp: , Rfl:  .  empagliflozin (JARDIANCE) 10 MG TABS tablet, Take 10 mg by mouth daily., Disp: 30 tablet, Rfl: 11 .  ferrous sulfate 325 (65 FE) MG tablet, Take 325 mg by mouth 2 (two) times daily with a meal., Disp: , Rfl:  .  gabapentin (NEURONTIN) 300 MG capsule, Take 300 mg by mouth daily. , Disp: , Rfl:  .  insulin glargine (LANTUS) 100 UNIT/ML injection, Inject 30 Units into the skin 2 (two) times daily., Disp: , Rfl:  .  isosorbide mononitrate (IMDUR) 30 MG 24 hr tablet, Take 1 tablet (30 mg total) by mouth daily., Disp: 30 tablet, Rfl: 3 .  Lactobacillus (ACIDOPHILUS PO), Take 1 capsule by mouth daily.,  Disp: , Rfl:  .  levothyroxine (SYNTHROID, LEVOTHROID) 75 MCG tablet, Take 75 mcg by mouth daily before breakfast., Disp: , Rfl:  .  losartan (COZAAR) 50 MG tablet, Take 1 tablet (50 mg total) by mouth daily., Disp: 30 tablet, Rfl: 3 .  metFORMIN (GLUCOPHAGE) 500 MG tablet, Take 500 mg by mouth 2 (two) times daily with a meal. , Disp: , Rfl:  .  Multiple Vitamins-Minerals (ALIVE ONCE DAILY WOMENS 50+ PO), Take 1 tablet by mouth daily., Disp: , Rfl:  .  omeprazole (PRILOSEC) 20 MG capsule, Take 20 mg by mouth daily. , Disp: , Rfl:  .  potassium chloride (K-DUR,KLOR-CON) 10 MEQ tablet, Take 2 tablets (20 mEq total) by mouth daily., Disp: 60 tablet, Rfl: 5 .  spironolactone (ALDACTONE) 25 MG tablet, Take 1 tablet (25 mg total) by mouth every other day., Disp: 15 tablet, Rfl: 5 .  torsemide (DEMADEX) 20 MG tablet, Take 1 tablet (20 mg total) by mouth 2 (two) times daily., Disp: 60 tablet, Rfl: 11 .  acetaminophen (TYLENOL) 650 MG CR tablet, Take 650 mg by mouth every 8 (eight) hours as needed for pain., Disp: , Rfl:  .  calcium citrate (CALCITRATE - DOSED IN MG ELEMENTAL CALCIUM) 950 MG tablet, Take 1 tablet by mouth 2 (two) times daily. ,  Disp: , Rfl:  .  nitroGLYCERIN (NITROSTAT) 0.4 MG SL tablet, DISSOLVE ONE TABLET UNDER THE TONGUE EVERY 5 MINUTES AS NEEDED FOR CHEST PAIN. (Patient not taking: Reported on 05/09/2018), Disp: 25 tablet, Rfl: 3 .  traMADol (ULTRAM) 50 MG tablet, Take 50 mg by mouth every 8 (eight) hours as needed. for pain, Disp: , Rfl: 0 .  Travoprost, BAK Free, (TRAVATAN) 0.004 % SOLN ophthalmic solution, Place 1 drop into both eyes daily., Disp: , Rfl:  Allergies  Allergen Reactions  . Other Other (See Comments)    Blood products  . Sulfa Antibiotics Rash  . Uloric [Febuxostat] Rash      Social History   Socioeconomic History  . Marital status: Widowed    Spouse name: Not on file  . Number of children: 3  . Years of education: Not on file  . Highest education level:  Some college, no degree  Occupational History  . Occupation: retired  Scientific laboratory technician  . Financial resource strain: Not hard at all  . Food insecurity:    Worry: Never true    Inability: Never true  . Transportation needs:    Medical: No    Non-medical: No  Tobacco Use  . Smoking status: Former Smoker    Packs/day: 1.00    Years: 22.00    Pack years: 22.00    Types: Cigarettes    Last attempt to quit: 1983    Years since quitting: 36.9  . Smokeless tobacco: Never Used  Substance and Sexual Activity  . Alcohol use: Yes    Comment: ocassional  . Drug use: No  . Sexual activity: Not Currently    Birth control/protection: None, Post-menopausal  Lifestyle  . Physical activity:    Days per week: Not on file    Minutes per session: Not on file  . Stress: Not on file  Relationships  . Social connections:    Talks on phone: Not on file    Gets together: Not on file    Attends religious service: Not on file    Active member of club or organization: Not on file    Attends meetings of clubs or organizations: Not on file    Relationship status: Not on file  . Intimate partner violence:    Fear of current or ex partner: Not on file    Emotionally abused: Not on file    Physically abused: Not on file    Forced sexual activity: Not on file  Other Topics Concern  . Not on file  Social History Narrative  . Not on file    Physical Exam Cardiovascular:     Rate and Rhythm: Normal rate and regular rhythm.  Pulmonary:     Effort: Pulmonary effort is normal.     Breath sounds: Normal breath sounds.  Musculoskeletal: Normal range of motion.     Right lower leg: Edema present.     Left lower leg: Edema present.  Skin:    General: Skin is warm and dry.  Neurological:     Mental Status: She is oriented to person, place, and time.         Future Appointments  Date Time Provider [place]  05/20/2018  8:10 AM [facility] DEVICE REMOTES [facility]   05/30/2018 12:00 PM Marzetta Board, DPM [facility]  08/07/2018 10:40 AM Bensimhon, Shaune Pascal, MD [facility] None    BP (!) 106/58 (BP Location: Right Arm, Patient Position: Sitting, Cuff Size: Normal)   Pulse 75  Resp 16   Wt 202 lb (91.6 kg)   SpO2 95%   BMI 34.67 kg/m   Weight yesterday- 202 lb Last visit weight- 202 lb  Danielle [name] was seen at home today and reported feeling generally well. She denied chest pain, SOB, headache, dizziness or orthopnea. She stated she has been compliant with her medications over the past week and her weight has been stable. Her medications were verified and her pillbox was refilled. She has a knee replacement scheduled for Tuesday of next week so Eliquis was not placed in her pillbox from Saturday through Tuesday morning. I did put Eliquis in her pillbox on Tuesday evening through Friday morning so she would be able to have medications to take in the time between discharge from the rehabilitation center and when I can come back out. I explained all of this to her and she was understanding.   [name], EMT 05/09/18  ACTION: Home visit completed Next visit planned for after discharge from rehab

## 2018-05-12 NOTE — Anesthesia Preprocedure Evaluation (Addendum)
Anesthesia Evaluation  Patient identified by MRN, date of birth, ID band Patient awake    Reviewed: Allergy & Precautions, NPO status , Patient's Chart, lab work & pertinent test results  History of Anesthesia Complications Negative for: history of anesthetic complications  Airway Mallampati: II  TM Distance: >3 FB Neck ROM: Full    Dental  (+) Dental Advisory Given, Edentulous Lower, Edentulous Upper   Pulmonary sleep apnea and Continuous Positive Airway Pressure Ventilation , former smoker,    Pulmonary exam normal        Cardiovascular hypertension, Pt. on home beta blockers and Pt. on medications +CHF  Normal cardiovascular exam+ pacemaker   Study Conclusions  - Left ventricle: The cavity size was normal. Systolic function was mildly reduced. The estimated ejection fraction was in the range of 45% to 50%. There is akinesis of the apical myocardium. Features are consistent with a pseudonormal left ventricular filling pattern, with concomitant abnormal relaxation and increased filling pressure (grade 2 diastolic dysfunction).     Neuro/Psych negative neurological ROS  negative psych ROS   GI/Hepatic negative GI ROS, Neg liver ROS, Patient received Oral Contrast Agents,  Endo/Other  diabetesHypothyroidism Morbid obesity  Renal/GU Renal InsufficiencyRenal disease     Musculoskeletal   Abdominal   Peds  Hematology   Anesthesia Other Findings   Reproductive/Obstetrics                           Anesthesia Physical Anesthesia Plan  ASA: III  Anesthesia Plan: Spinal   Post-op Pain Management:  Regional for Post-op pain   Induction:   PONV Risk Score and Plan: Ondansetron and Propofol infusion  Airway Management Planned: Natural Airway and Simple Face Mask  Additional Equipment:   Intra-op Plan:   Post-operative Plan:   Informed Consent: I have reviewed the patients History  and Physical, chart, labs and discussed the procedure including the risks, benefits and alternatives for the proposed anesthesia with the patient or authorized representative who has indicated his/her understanding and acceptance.   Dental advisory given  Plan Discussed with: Anesthesiologist, CRNA and Surgeon  Anesthesia Plan Comments:       Anesthesia Quick Evaluation

## 2018-05-13 ENCOUNTER — Inpatient Hospital Stay (HOSPITAL_COMMUNITY): Payer: Medicare HMO

## 2018-05-13 ENCOUNTER — Inpatient Hospital Stay (HOSPITAL_COMMUNITY)
Admission: RE | Admit: 2018-05-13 | Discharge: 2018-05-17 | DRG: 470 | Disposition: A | Discharge status: Home Health | Payer: Medicare HMO | Source: Ambulatory Visit | Attending: Orthopedic Surgery | Admitting: Orthopedic Surgery

## 2018-05-13 ENCOUNTER — Encounter: Payer: Self-pay | Admitting: Internal Medicine

## 2018-05-13 ENCOUNTER — Other Ambulatory Visit: Payer: Self-pay

## 2018-05-13 ENCOUNTER — Encounter (HOSPITAL_COMMUNITY)
Admission: RE | Disposition: A | Discharge status: Home Health | Payer: Self-pay | Source: Ambulatory Visit | Attending: Orthopedic Surgery

## 2018-05-13 ENCOUNTER — Encounter (HOSPITAL_COMMUNITY): Payer: Self-pay

## 2018-05-13 ENCOUNTER — Inpatient Hospital Stay (HOSPITAL_COMMUNITY): Payer: Medicare HMO | Admitting: Anesthesiology

## 2018-05-13 DIAGNOSIS — G8929 Other chronic pain: Secondary | ICD-10-CM | POA: Diagnosis present

## 2018-05-13 DIAGNOSIS — Z888 Allergy status to other drugs, medicaments and biological substances status: Secondary | ICD-10-CM | POA: Diagnosis not present

## 2018-05-13 DIAGNOSIS — Z471 Aftercare following joint replacement surgery: Secondary | ICD-10-CM | POA: Diagnosis not present

## 2018-05-13 DIAGNOSIS — E669 Obesity, unspecified: Secondary | ICD-10-CM | POA: Diagnosis present

## 2018-05-13 DIAGNOSIS — Z7901 Long term (current) use of anticoagulants: Secondary | ICD-10-CM

## 2018-05-13 DIAGNOSIS — Z853 Personal history of malignant neoplasm of breast: Secondary | ICD-10-CM

## 2018-05-13 DIAGNOSIS — I5030 Unspecified diastolic (congestive) heart failure: Secondary | ICD-10-CM

## 2018-05-13 DIAGNOSIS — I10 Essential (primary) hypertension: Secondary | ICD-10-CM

## 2018-05-13 DIAGNOSIS — Z7984 Long term (current) use of oral hypoglycemic drugs: Secondary | ICD-10-CM

## 2018-05-13 DIAGNOSIS — E1122 Type 2 diabetes mellitus with diabetic chronic kidney disease: Secondary | ICD-10-CM | POA: Diagnosis present

## 2018-05-13 DIAGNOSIS — Z96651 Presence of right artificial knee joint: Secondary | ICD-10-CM | POA: Diagnosis not present

## 2018-05-13 DIAGNOSIS — I509 Heart failure, unspecified: Secondary | ICD-10-CM | POA: Diagnosis not present

## 2018-05-13 DIAGNOSIS — C801 Malignant (primary) neoplasm, unspecified: Secondary | ICD-10-CM

## 2018-05-13 DIAGNOSIS — Z6834 Body mass index (BMI) 34.0-34.9, adult: Secondary | ICD-10-CM | POA: Diagnosis not present

## 2018-05-13 DIAGNOSIS — Z87891 Personal history of nicotine dependence: Secondary | ICD-10-CM | POA: Diagnosis not present

## 2018-05-13 DIAGNOSIS — M1711 Unilateral primary osteoarthritis, right knee: Principal | ICD-10-CM | POA: Diagnosis present

## 2018-05-13 DIAGNOSIS — Z950 Presence of cardiac pacemaker: Secondary | ICD-10-CM

## 2018-05-13 DIAGNOSIS — I2510 Atherosclerotic heart disease of native coronary artery without angina pectoris: Secondary | ICD-10-CM | POA: Diagnosis present

## 2018-05-13 DIAGNOSIS — G8918 Other acute postprocedural pain: Secondary | ICD-10-CM | POA: Diagnosis not present

## 2018-05-13 DIAGNOSIS — Z955 Presence of coronary angioplasty implant and graft: Secondary | ICD-10-CM | POA: Diagnosis not present

## 2018-05-13 DIAGNOSIS — I130 Hypertensive heart and chronic kidney disease with heart failure and stage 1 through stage 4 chronic kidney disease, or unspecified chronic kidney disease: Secondary | ICD-10-CM | POA: Diagnosis present

## 2018-05-13 DIAGNOSIS — Z9221 Personal history of antineoplastic chemotherapy: Secondary | ICD-10-CM

## 2018-05-13 DIAGNOSIS — Z79890 Hormone replacement therapy (postmenopausal): Secondary | ICD-10-CM

## 2018-05-13 DIAGNOSIS — Z923 Personal history of irradiation: Secondary | ICD-10-CM | POA: Diagnosis not present

## 2018-05-13 DIAGNOSIS — D62 Acute posthemorrhagic anemia: Secondary | ICD-10-CM | POA: Diagnosis not present

## 2018-05-13 DIAGNOSIS — E119 Type 2 diabetes mellitus without complications: Secondary | ICD-10-CM

## 2018-05-13 DIAGNOSIS — Z79899 Other long term (current) drug therapy: Secondary | ICD-10-CM

## 2018-05-13 DIAGNOSIS — E039 Hypothyroidism, unspecified: Secondary | ICD-10-CM | POA: Diagnosis present

## 2018-05-13 DIAGNOSIS — N183 Chronic kidney disease, stage 3 (moderate): Secondary | ICD-10-CM | POA: Diagnosis present

## 2018-05-13 DIAGNOSIS — I5042 Chronic combined systolic (congestive) and diastolic (congestive) heart failure: Secondary | ICD-10-CM | POA: Diagnosis present

## 2018-05-13 DIAGNOSIS — N179 Acute kidney failure, unspecified: Secondary | ICD-10-CM | POA: Diagnosis not present

## 2018-05-13 DIAGNOSIS — G4733 Obstructive sleep apnea (adult) (pediatric): Secondary | ICD-10-CM

## 2018-05-13 DIAGNOSIS — T398X5A Adverse effect of other nonopioid analgesics and antipyretics, not elsewhere classified, initial encounter: Secondary | ICD-10-CM | POA: Diagnosis not present

## 2018-05-13 DIAGNOSIS — Z882 Allergy status to sulfonamides status: Secondary | ICD-10-CM | POA: Diagnosis not present

## 2018-05-13 DIAGNOSIS — E079 Disorder of thyroid, unspecified: Secondary | ICD-10-CM

## 2018-05-13 DIAGNOSIS — I110 Hypertensive heart disease with heart failure: Secondary | ICD-10-CM | POA: Diagnosis not present

## 2018-05-13 HISTORY — DX: Unilateral primary osteoarthritis, right knee: M17.11

## 2018-05-13 HISTORY — PX: TOTAL KNEE ARTHROPLASTY: SHX125

## 2018-05-13 HISTORY — DX: Acute kidney failure, unspecified: N17.9

## 2018-05-13 LAB — GLUCOSE, CAPILLARY
Glucose-Capillary: 87 mg/dL (ref 70–99)
Glucose-Capillary: 91 mg/dL (ref 70–99)
Glucose-Capillary: 103 mg/dL — ABNORMAL HIGH (ref 70–99)
Glucose-Capillary: 178 mg/dL — ABNORMAL HIGH (ref 70–99)

## 2018-05-13 SURGERY — ARTHROPLASTY, KNEE, TOTAL
Anesthesia: Spinal | Site: Knee | Laterality: Right

## 2018-05-13 MED ORDER — CARVEDILOL 25 MG PO TABS
25.0000 mg | ORAL_TABLET | Freq: Two times a day (BID) | ORAL | Status: DC
  Administered 2018-05-13 – 2018-05-17 (×8): 25 mg via ORAL
  Filled 2018-05-13 (×8): qty 1

## 2018-05-13 MED ORDER — ONDANSETRON HCL 4 MG PO TABS: 4.0000 mg | ORAL_TABLET | Freq: Three times a day (TID) | ORAL | 0 refills | Status: DC | PRN

## 2018-05-13 MED ORDER — ONDANSETRON HCL 4 MG/2ML IJ SOLN
INTRAMUSCULAR | Status: AC
  Filled 2018-05-13: qty 2

## 2018-05-13 MED ORDER — APIXABAN 5 MG PO TABS
5.0000 mg | ORAL_TABLET | Freq: Two times a day (BID) | ORAL | Status: DC
  Administered 2018-05-14 – 2018-05-17 (×7): 5 mg via ORAL
  Filled 2018-05-13 (×7): qty 1

## 2018-05-13 MED ORDER — LOSARTAN POTASSIUM 50 MG PO TABS
50.0000 mg | ORAL_TABLET | Freq: Every day | ORAL | Status: DC
  Administered 2018-05-13 – 2018-05-17 (×4): 50 mg via ORAL
  Filled 2018-05-13 (×4): qty 1

## 2018-05-13 MED ORDER — PROPOFOL 10 MG/ML IV BOLUS
INTRAVENOUS | Status: DC | PRN
  Administered 2018-05-13: 20 mg via INTRAVENOUS

## 2018-05-13 MED ORDER — TRANEXAMIC ACID-NACL 1000-0.7 MG/100ML-% IV SOLN
1000.0000 mg | Freq: Once | INTRAVENOUS | Status: AC
  Administered 2018-05-13: 1000 mg via INTRAVENOUS
  Filled 2018-05-13: qty 100

## 2018-05-13 MED ORDER — DEXAMETHASONE SODIUM PHOSPHATE 10 MG/ML IJ SOLN
10.0000 mg | Freq: Once | INTRAMUSCULAR | Status: DC
  Filled 2018-05-13: qty 1

## 2018-05-13 MED ORDER — ALUM & MAG HYDROXIDE-SIMETH 200-200-20 MG/5ML PO SUSP: 30.0000 mL | ORAL | Status: DC | PRN

## 2018-05-13 MED ORDER — PROPOFOL 10 MG/ML IV BOLUS
INTRAVENOUS | Status: AC
  Filled 2018-05-13: qty 60

## 2018-05-13 MED ORDER — MORPHINE SULFATE (PF) 2 MG/ML IV SOLN
0.5000 mg | INTRAVENOUS | Status: DC | PRN
  Administered 2018-05-13 – 2018-05-15 (×3): 1 mg via INTRAVENOUS
  Filled 2018-05-13 (×3): qty 1

## 2018-05-13 MED ORDER — BIOTIN 10 MG PO CAPS: 10.0000 mg | ORAL_CAPSULE | Freq: Every day | ORAL | Status: DC

## 2018-05-13 MED ORDER — METHOCARBAMOL 500 MG IVPB - SIMPLE MED
INTRAVENOUS | Status: AC
  Filled 2018-05-13: qty 50

## 2018-05-13 MED ORDER — CANAGLIFLOZIN 100 MG PO TABS: 100.0000 mg | ORAL_TABLET | Freq: Every day | ORAL | Status: DC

## 2018-05-13 MED ORDER — ISOSORBIDE MONONITRATE ER 30 MG PO TB24
30.0000 mg | ORAL_TABLET | Freq: Every day | ORAL | Status: DC
  Administered 2018-05-14 – 2018-05-17 (×3): 30 mg via ORAL
  Filled 2018-05-13 (×4): qty 1

## 2018-05-13 MED ORDER — FENTANYL CITRATE (PF) 100 MCG/2ML IJ SOLN
INTRAMUSCULAR | Status: AC
  Administered 2018-05-13: 50 ug via INTRAVENOUS
  Filled 2018-05-13: qty 2

## 2018-05-13 MED ORDER — SENNA-DOCUSATE SODIUM 8.6-50 MG PO TABS: 2.0000 | ORAL_TABLET | Freq: Every day | ORAL | 1 refills | Status: DC

## 2018-05-13 MED ORDER — METOCLOPRAMIDE HCL 5 MG/ML IJ SOLN: 5.0000 mg | Freq: Three times a day (TID) | INTRAMUSCULAR | Status: DC | PRN

## 2018-05-13 MED ORDER — DOCUSATE SODIUM 100 MG PO CAPS
100.0000 mg | ORAL_CAPSULE | Freq: Two times a day (BID) | ORAL | Status: DC
  Administered 2018-05-13 – 2018-05-17 (×8): 100 mg via ORAL
  Filled 2018-05-13 (×8): qty 1

## 2018-05-13 MED ORDER — DEXAMETHASONE SODIUM PHOSPHATE 10 MG/ML IJ SOLN
INTRAMUSCULAR | Status: AC
  Filled 2018-05-13: qty 1

## 2018-05-13 MED ORDER — HYDROCODONE-ACETAMINOPHEN 7.5-325 MG PO TABS
1.0000 | ORAL_TABLET | ORAL | Status: DC | PRN
  Administered 2018-05-13: 2 via ORAL
  Administered 2018-05-13 – 2018-05-15 (×2): 1 via ORAL
  Administered 2018-05-15 (×2): 2 via ORAL
  Administered 2018-05-16 – 2018-05-17 (×6): 1 via ORAL
  Filled 2018-05-13: qty 2
  Filled 2018-05-13: qty 1
  Filled 2018-05-13: qty 2
  Filled 2018-05-13 (×6): qty 1
  Filled 2018-05-13: qty 2
  Filled 2018-05-13: qty 1
  Filled 2018-05-13: qty 2

## 2018-05-13 MED ORDER — KETOROLAC TROMETHAMINE 30 MG/ML IJ SOLN
INTRAMUSCULAR | Status: AC
  Filled 2018-05-13: qty 1

## 2018-05-13 MED ORDER — BUPIVACAINE-EPINEPHRINE (PF) 0.5% -1:200000 IJ SOLN
INTRAMUSCULAR | Status: DC | PRN
  Administered 2018-05-13: 30 mL via PERINEURAL

## 2018-05-13 MED ORDER — FENTANYL CITRATE (PF) 100 MCG/2ML IJ SOLN
25.0000 ug | INTRAMUSCULAR | Status: DC | PRN
  Administered 2018-05-13 (×2): 50 ug via INTRAVENOUS

## 2018-05-13 MED ORDER — ADULT MULTIVITAMIN W/MINERALS CH
ORAL_TABLET | Freq: Every day | ORAL | Status: DC
  Administered 2018-05-14 – 2018-05-17 (×4): 1 via ORAL
  Filled 2018-05-13 (×4): qty 1

## 2018-05-13 MED ORDER — METHOCARBAMOL 500 MG PO TABS
500.0000 mg | ORAL_TABLET | Freq: Four times a day (QID) | ORAL | Status: DC | PRN
  Administered 2018-05-13 – 2018-05-17 (×12): 500 mg via ORAL
  Filled 2018-05-13 (×12): qty 1

## 2018-05-13 MED ORDER — ATORVASTATIN CALCIUM 40 MG PO TABS
80.0000 mg | ORAL_TABLET | Freq: Every day | ORAL | Status: DC
  Administered 2018-05-15 – 2018-05-16 (×2): 80 mg via ORAL
  Filled 2018-05-13 (×2): qty 2

## 2018-05-13 MED ORDER — PHENOL 1.4 % MT LIQD: 1.0000 | OROMUCOSAL | Status: DC | PRN

## 2018-05-13 MED ORDER — CEFAZOLIN SODIUM-DEXTROSE 2-4 GM/100ML-% IV SOLN
2.0000 g | INTRAVENOUS | Status: AC
  Administered 2018-05-13: 2 g via INTRAVENOUS
  Filled 2018-05-13: qty 100

## 2018-05-13 MED ORDER — BUPIVACAINE IN DEXTROSE 0.75-8.25 % IT SOLN
INTRATHECAL | Status: DC | PRN
  Administered 2018-05-13: 1.6 mg via INTRATHECAL

## 2018-05-13 MED ORDER — METHOCARBAMOL 500 MG IVPB - SIMPLE MED
500.0000 mg | Freq: Four times a day (QID) | INTRAVENOUS | Status: DC | PRN
  Administered 2018-05-13: 500 mg via INTRAVENOUS
  Filled 2018-05-13: qty 50

## 2018-05-13 MED ORDER — POLYETHYLENE GLYCOL 3350 17 G PO PACK
17.0000 g | PACK | Freq: Every day | ORAL | Status: DC | PRN
  Filled 2018-05-13: qty 1

## 2018-05-13 MED ORDER — CHLORHEXIDINE GLUCONATE 4 % EX LIQD: 60.0000 mL | Freq: Once | CUTANEOUS | Status: DC

## 2018-05-13 MED ORDER — ONDANSETRON HCL 4 MG/2ML IJ SOLN
4.0000 mg | Freq: Four times a day (QID) | INTRAMUSCULAR | Status: DC | PRN
  Administered 2018-05-14 – 2018-05-15 (×2): 4 mg via INTRAVENOUS
  Filled 2018-05-13 (×2): qty 2

## 2018-05-13 MED ORDER — FERROUS SULFATE 325 (65 FE) MG PO TABS
325.0000 mg | ORAL_TABLET | Freq: Two times a day (BID) | ORAL | Status: DC
  Administered 2018-05-13 – 2018-05-17 (×8): 325 mg via ORAL
  Filled 2018-05-13 (×7): qty 1

## 2018-05-13 MED ORDER — LATANOPROST 0.005 % OP SOLN
1.0000 [drp] | Freq: Every day | OPHTHALMIC | Status: DC
  Administered 2018-05-15 – 2018-05-16 (×2): 1 [drp] via OPHTHALMIC
  Filled 2018-05-13 (×2): qty 2.5

## 2018-05-13 MED ORDER — SODIUM CHLORIDE 0.9 % IR SOLN
Status: DC | PRN
  Administered 2018-05-13: 1000 mL

## 2018-05-13 MED ORDER — CEFAZOLIN SODIUM-DEXTROSE 2-4 GM/100ML-% IV SOLN
2.0000 g | Freq: Four times a day (QID) | INTRAVENOUS | Status: AC
  Administered 2018-05-13 (×2): 2 g via INTRAVENOUS
  Filled 2018-05-13 (×2): qty 100

## 2018-05-13 MED ORDER — ONDANSETRON HCL 4 MG/2ML IJ SOLN
INTRAMUSCULAR | Status: DC | PRN
  Administered 2018-05-13: 4 mg via INTRAVENOUS

## 2018-05-13 MED ORDER — ALLOPURINOL 100 MG PO TABS
100.0000 mg | ORAL_TABLET | Freq: Every day | ORAL | Status: DC
  Administered 2018-05-14 – 2018-05-17 (×4): 100 mg via ORAL
  Filled 2018-05-13 (×4): qty 1

## 2018-05-13 MED ORDER — ACETAMINOPHEN 325 MG PO TABS: 325.0000 mg | ORAL_TABLET | Freq: Four times a day (QID) | ORAL | Status: DC | PRN

## 2018-05-13 MED ORDER — CALCIUM CITRATE 950 (200 CA) MG PO TABS
1.0000 | ORAL_TABLET | Freq: Two times a day (BID) | ORAL | Status: DC
  Administered 2018-05-13 – 2018-05-17 (×7): 200 mg via ORAL
  Filled 2018-05-13 (×8): qty 1

## 2018-05-13 MED ORDER — POTASSIUM CHLORIDE IN NACL 20-0.45 MEQ/L-% IV SOLN
INTRAVENOUS | Status: DC
  Administered 2018-05-13: 14:00:00 via INTRAVENOUS
  Filled 2018-05-13: qty 1000

## 2018-05-13 MED ORDER — HYDROCODONE-ACETAMINOPHEN 5-325 MG PO TABS
1.0000 | ORAL_TABLET | ORAL | Status: DC | PRN
  Administered 2018-05-14 (×2): 2 via ORAL
  Administered 2018-05-15: 1 via ORAL
  Filled 2018-05-13: qty 1
  Filled 2018-05-13: qty 2

## 2018-05-13 MED ORDER — LEVOTHYROXINE SODIUM 75 MCG PO TABS
75.0000 ug | ORAL_TABLET | Freq: Every day | ORAL | Status: DC
  Administered 2018-05-14 – 2018-05-17 (×4): 75 ug via ORAL
  Filled 2018-05-13 (×4): qty 1

## 2018-05-13 MED ORDER — ONDANSETRON HCL 4 MG PO TABS
4.0000 mg | ORAL_TABLET | Freq: Four times a day (QID) | ORAL | Status: DC | PRN
  Administered 2018-05-15: 4 mg via ORAL
  Filled 2018-05-13: qty 1

## 2018-05-13 MED ORDER — PROMETHAZINE HCL 25 MG/ML IJ SOLN: 6.2500 mg | INTRAMUSCULAR | Status: DC | PRN

## 2018-05-13 MED ORDER — MAGNESIUM CITRATE PO SOLN: 1.0000 | Freq: Once | ORAL | Status: DC | PRN

## 2018-05-13 MED ORDER — PHENYLEPHRINE 40 MCG/ML (10ML) SYRINGE FOR IV PUSH (FOR BLOOD PRESSURE SUPPORT)
PREFILLED_SYRINGE | INTRAVENOUS | Status: AC
  Filled 2018-05-13: qty 10

## 2018-05-13 MED ORDER — LACTATED RINGERS IV SOLN
INTRAVENOUS | Status: DC
  Administered 2018-05-13 (×2): via INTRAVENOUS

## 2018-05-13 MED ORDER — HYDROCODONE-ACETAMINOPHEN 10-325 MG PO TABS: 1.0000 | ORAL_TABLET | Freq: Four times a day (QID) | ORAL | 0 refills | Status: DC | PRN

## 2018-05-13 MED ORDER — METOCLOPRAMIDE HCL 5 MG PO TABS: 5.0000 mg | ORAL_TABLET | Freq: Three times a day (TID) | ORAL | Status: DC | PRN

## 2018-05-13 MED ORDER — PROPOFOL 500 MG/50ML IV EMUL
INTRAVENOUS | Status: DC | PRN
  Administered 2018-05-13: 75 ug/kg/min via INTRAVENOUS

## 2018-05-13 MED ORDER — BACLOFEN 10 MG PO TABS: 10.0000 mg | ORAL_TABLET | Freq: Three times a day (TID) | ORAL | 0 refills | Status: DC

## 2018-05-13 MED ORDER — GABAPENTIN 300 MG PO CAPS
300.0000 mg | ORAL_CAPSULE | Freq: Every day | ORAL | Status: DC
  Administered 2018-05-14 – 2018-05-17 (×4): 300 mg via ORAL
  Filled 2018-05-13 (×5): qty 1

## 2018-05-13 MED ORDER — TRANEXAMIC ACID-NACL 1000-0.7 MG/100ML-% IV SOLN
1000.0000 mg | INTRAVENOUS | Status: AC
  Administered 2018-05-13: 1000 mg via INTRAVENOUS
  Filled 2018-05-13: qty 100

## 2018-05-13 MED ORDER — DEXAMETHASONE SODIUM PHOSPHATE 10 MG/ML IJ SOLN
INTRAMUSCULAR | Status: DC | PRN
  Administered 2018-05-13: 10 mg via INTRAVENOUS

## 2018-05-13 MED ORDER — KETOROLAC TROMETHAMINE 30 MG/ML IJ SOLN
INTRAMUSCULAR | Status: DC | PRN
  Administered 2018-05-13: 30 mg

## 2018-05-13 MED ORDER — ACETAMINOPHEN 500 MG PO TABS
500.0000 mg | ORAL_TABLET | Freq: Four times a day (QID) | ORAL | Status: AC
  Administered 2018-05-13 – 2018-05-14 (×3): 500 mg via ORAL
  Filled 2018-05-13 (×4): qty 1

## 2018-05-13 MED ORDER — KETOROLAC TROMETHAMINE 15 MG/ML IJ SOLN
7.5000 mg | Freq: Four times a day (QID) | INTRAMUSCULAR | Status: DC
  Administered 2018-05-13 – 2018-05-14 (×3): 7.5 mg via INTRAVENOUS
  Filled 2018-05-13 (×3): qty 1

## 2018-05-13 MED ORDER — POTASSIUM CHLORIDE CRYS ER 20 MEQ PO TBCR
20.0000 meq | EXTENDED_RELEASE_TABLET | Freq: Every day | ORAL | Status: DC
  Administered 2018-05-14 – 2018-05-17 (×4): 20 meq via ORAL
  Filled 2018-05-13 (×5): qty 1

## 2018-05-13 MED ORDER — PHENYLEPHRINE 40 MCG/ML (10ML) SYRINGE FOR IV PUSH (FOR BLOOD PRESSURE SUPPORT)
PREFILLED_SYRINGE | INTRAVENOUS | Status: DC | PRN
  Administered 2018-05-13: 120 ug via INTRAVENOUS

## 2018-05-13 MED ORDER — NITROGLYCERIN 0.4 MG SL SUBL: 0.4000 mg | SUBLINGUAL_TABLET | SUBLINGUAL | Status: DC | PRN

## 2018-05-13 MED ORDER — INSULIN GLARGINE 100 UNIT/ML ~~LOC~~ SOLN
30.0000 [IU] | Freq: Two times a day (BID) | SUBCUTANEOUS | Status: DC
  Administered 2018-05-13 – 2018-05-17 (×7): 30 [IU] via SUBCUTANEOUS
  Filled 2018-05-13 (×10): qty 0.3

## 2018-05-13 MED ORDER — WATER FOR IRRIGATION, STERILE IR SOLN
Status: DC | PRN
  Administered 2018-05-13: 2000 mL

## 2018-05-13 MED ORDER — TORSEMIDE 20 MG PO TABS
20.0000 mg | ORAL_TABLET | Freq: Two times a day (BID) | ORAL | Status: DC
  Administered 2018-05-13 – 2018-05-17 (×8): 20 mg via ORAL
  Filled 2018-05-13 (×8): qty 1

## 2018-05-13 MED ORDER — PANTOPRAZOLE SODIUM 40 MG PO TBEC
80.0000 mg | DELAYED_RELEASE_TABLET | Freq: Every day | ORAL | Status: DC
  Administered 2018-05-14 – 2018-05-17 (×4): 80 mg via ORAL
  Filled 2018-05-13 (×5): qty 2

## 2018-05-13 MED ORDER — TRAMADOL HCL 50 MG PO TABS
50.0000 mg | ORAL_TABLET | Freq: Three times a day (TID) | ORAL | Status: DC | PRN
  Administered 2018-05-14 – 2018-05-17 (×4): 50 mg via ORAL
  Filled 2018-05-13 (×4): qty 1

## 2018-05-13 MED ORDER — BUPIVACAINE HCL 0.25 % IJ SOLN
INTRAMUSCULAR | Status: DC | PRN
  Administered 2018-05-13: 30 mL

## 2018-05-13 MED ORDER — BACID PO TABS
1.0000 | ORAL_TABLET | Freq: Every day | ORAL | Status: DC
  Filled 2018-05-13: qty 1

## 2018-05-13 MED ORDER — ZOLPIDEM TARTRATE 5 MG PO TABS: 5.0000 mg | ORAL_TABLET | Freq: Every evening | ORAL | Status: DC | PRN

## 2018-05-13 MED ORDER — METFORMIN HCL 500 MG PO TABS
500.0000 mg | ORAL_TABLET | Freq: Two times a day (BID) | ORAL | Status: DC
  Administered 2018-05-13 – 2018-05-15 (×4): 500 mg via ORAL
  Filled 2018-05-13 (×4): qty 1

## 2018-05-13 MED ORDER — FENTANYL CITRATE (PF) 100 MCG/2ML IJ SOLN
INTRAMUSCULAR | Status: DC | PRN
  Administered 2018-05-13 (×2): 50 ug via INTRAVENOUS

## 2018-05-13 MED ORDER — MENTHOL 3 MG MT LOZG: 1.0000 | LOZENGE | OROMUCOSAL | Status: DC | PRN

## 2018-05-13 MED ORDER — BISACODYL 10 MG RE SUPP: 10.0000 mg | Freq: Every day | RECTAL | Status: DC | PRN

## 2018-05-13 MED ORDER — SPIRONOLACTONE 25 MG PO TABS
25.0000 mg | ORAL_TABLET | ORAL | Status: DC
  Administered 2018-05-14 – 2018-05-16 (×2): 25 mg via ORAL
  Filled 2018-05-13 (×2): qty 1

## 2018-05-13 MED ORDER — DIPHENHYDRAMINE HCL 12.5 MG/5ML PO ELIX: 12.5000 mg | ORAL_SOLUTION | ORAL | Status: DC | PRN

## 2018-05-13 MED ORDER — MIDAZOLAM HCL 5 MG/5ML IJ SOLN
INTRAMUSCULAR | Status: DC | PRN
  Administered 2018-05-13: 2 mg via INTRAVENOUS

## 2018-05-13 MED ORDER — BUPIVACAINE HCL (PF) 0.25 % IJ SOLN
INTRAMUSCULAR | Status: AC
  Filled 2018-05-13: qty 30

## 2018-05-13 MED ORDER — MIDAZOLAM HCL 2 MG/2ML IJ SOLN
INTRAMUSCULAR | Status: AC
  Filled 2018-05-13: qty 2

## 2018-05-13 MED ORDER — FENTANYL CITRATE (PF) 100 MCG/2ML IJ SOLN
INTRAMUSCULAR | Status: AC
  Filled 2018-05-13: qty 2

## 2018-05-13 SURGICAL SUPPLY — 59 items
BAG ZIPLOCK 12X15 (MISCELLANEOUS) IMPLANT
BANDAGE ACE 6X5 VEL STRL LF (GAUZE/BANDAGES/DRESSINGS) ×2 IMPLANT
BLADE SAG 18X100X1.27 (BLADE) ×4 IMPLANT
BLADE SURG 15 STRL LF DISP TIS (BLADE) ×1 IMPLANT
BLADE SURG 15 STRL SS (BLADE) ×1
BLADE SURG SZ10 CARB STEEL (BLADE) ×4 IMPLANT
BNDG ELASTIC 6X10 VLCR STRL LF (GAUZE/BANDAGES/DRESSINGS) ×2 IMPLANT
BONE VNGD MED NAIL (MISCELLANEOUS) ×2 IMPLANT
BOWL SMART MIX CTS (DISPOSABLE) ×2 IMPLANT
CEMENT BONE R 1X40 (Cement) ×4 IMPLANT
CLSR STERI-STRIP ANTIMIC 1/2X4 (GAUZE/BANDAGES/DRESSINGS) ×2 IMPLANT
COMP FEM VG IL 70 RT (Knees) ×2 IMPLANT
COMPONENT FEM VG IL 70 RT (Knees) ×1 IMPLANT
COVER SURGICAL LIGHT HANDLE (MISCELLANEOUS) ×2 IMPLANT
COVER WAND RF STERILE (DRAPES) ×2 IMPLANT
CUFF TOURN SGL QUICK 34 (TOURNIQUET CUFF) ×1
CUFF TRNQT CYL 34X4X40X1 (TOURNIQUET CUFF) ×1 IMPLANT
DECANTER SPIKE VIAL GLASS SM (MISCELLANEOUS) ×2 IMPLANT
DISTAL FEMORAL PEG (Knees) ×2 IMPLANT
DRAPE U-SHAPE 47X51 STRL (DRAPES) ×2 IMPLANT
DRSG MEPILEX BORDER 4X8 (GAUZE/BANDAGES/DRESSINGS) ×2 IMPLANT
DURAPREP 26ML APPLICATOR (WOUND CARE) ×4 IMPLANT
ELECT REM PT RETURN 15FT ADLT (MISCELLANEOUS) ×2 IMPLANT
GLOVE BIO SURGEON STRL SZ7.5 (GLOVE) ×2 IMPLANT
GLOVE BIO SURGEON STRL SZ8 (GLOVE) ×2 IMPLANT
GLOVE BIO SURGEON STRL SZ8.5 (GLOVE) ×2 IMPLANT
GLOVE BIOGEL PI IND STRL 7.0 (GLOVE) ×4 IMPLANT
GLOVE BIOGEL PI IND STRL 8 (GLOVE) ×2 IMPLANT
GLOVE BIOGEL PI IND STRL 9 (GLOVE) ×1 IMPLANT
GLOVE BIOGEL PI INDICATOR 7.0 (GLOVE) ×4
GLOVE BIOGEL PI INDICATOR 8 (GLOVE) ×2
GLOVE BIOGEL PI INDICATOR 9 (GLOVE) ×1
GOWN SPEC L3 XXLG W/TWL (GOWN DISPOSABLE) ×2 IMPLANT
GOWN STRL REUS W/TWL 2XL LVL3 (GOWN DISPOSABLE) ×2 IMPLANT
GOWN STRL REUS W/TWL LRG LVL3 (GOWN DISPOSABLE) ×2 IMPLANT
GOWN STRL REUS W/TWL XL LVL3 (GOWN DISPOSABLE) ×2 IMPLANT
HANDPIECE INTERPULSE COAX TIP (DISPOSABLE) ×1
HOLDER FOLEY CATH W/STRAP (MISCELLANEOUS) ×2 IMPLANT
HOOD PEEL AWAY FLYTE STAYCOOL (MISCELLANEOUS) ×6 IMPLANT
IMMOBILIZER KNEE 20 (SOFTGOODS) ×2
IMMOBILIZER KNEE 20 THIGH 36 (SOFTGOODS) ×1 IMPLANT
INSERT TIBIA BEAR 7/75 10 KNEE (Knees) ×2 IMPLANT
MANIFOLD NEPTUNE II (INSTRUMENTS) ×2 IMPLANT
NS IRRIG 1000ML POUR BTL (IV SOLUTION) ×2 IMPLANT
PACK ICE MAXI GEL EZY WRAP (MISCELLANEOUS) ×2 IMPLANT
PACK TOTAL KNEE CUSTOM (KITS) ×2 IMPLANT
PEG FEMORAL DISTAL (Knees) ×1 IMPLANT
PEG PATELLA SERIES A 37MMX10MM (Orthopedic Implant) ×2 IMPLANT
PLATE KNEE TIBIAL 71MM FIXED (Plate) ×2 IMPLANT
PROTECTOR NERVE ULNAR (MISCELLANEOUS) ×2 IMPLANT
SET HNDPC FAN SPRY TIP SCT (DISPOSABLE) ×1 IMPLANT
SUT VIC AB 0 CT1 36 (SUTURE) ×2 IMPLANT
SUT VIC AB 2-0 CT1 27 (SUTURE) ×1
SUT VIC AB 2-0 CT1 TAPERPNT 27 (SUTURE) ×1 IMPLANT
SUT VIC AB 3-0 SH 8-18 (SUTURE) ×2 IMPLANT
TRAY FOLEY CATH 14FRSI W/METER (CATHETERS) ×2 IMPLANT
TRAY FOLEY MTR SLVR 16FR STAT (SET/KITS/TRAYS/PACK) IMPLANT
WATER STERILE IRR 1000ML POUR (IV SOLUTION) ×4 IMPLANT
WRAP KNEE MAXI GEL POST OP (GAUZE/BANDAGES/DRESSINGS) ×2 IMPLANT

## 2018-05-13 NOTE — Discharge Instructions (Signed)

## 2018-05-13 NOTE — Progress Notes (Signed)
PT Cancellation Note  Patient Details Name: Danielle [name] MRN: [id number] DOB: 09-Dec-1945   Cancelled Treatment:    Reason Eval/Treat Not Completed: Pain limiting ability to participate; patient medicated 30 min prior but reports in too much pain to attempt mobility right now.  Will try again later.   Reginia Naas 05/13/2018, 3:21 PM  Magda Kiel, [place] [phone] 05/13/2018

## 2018-05-13 NOTE — Transfer of Care (Signed)
Immediate Anesthesia Transfer of Care Note  Patient: Danielle [name]  Procedure(s) Performed: TOTAL KNEE ARTHROPLASTY (Right Knee)  Patient Location: PACU  Anesthesia Type:Spinal  Level of Consciousness: awake, alert  and oriented  Airway & Oxygen Therapy: Patient Spontanous Breathing and Patient connected to nasal cannula oxygen  Post-op Assessment: Report given to RN and Post -op Vital signs reviewed and stable  Post vital signs: Reviewed and stable  Last Vitals:  Vitals Value Taken Time  BP 125/63 05/13/2018  9:53 AM  Temp    Pulse 66 05/13/2018  9:56 AM  Resp 20 05/13/2018  9:56 AM  SpO2 98 % 05/13/2018  9:56 AM  Vitals shown include unvalidated device data.  Last Pain:  Vitals:   05/13/18 0641  TempSrc:   PainSc: 8       Patients Stated Pain Goal: 7 (83/43/73 [id number])  Complications: No apparent anesthesia complications

## 2018-05-13 NOTE — Anesthesia Postprocedure Evaluation (Signed)
Anesthesia Post Note  Patient: Danielle [name]  Procedure(s) Performed: TOTAL KNEE ARTHROPLASTY (Right Knee)     Patient location during evaluation: PACU Anesthesia Type: Spinal Level of consciousness: awake and alert Pain management: pain level controlled Vital Signs Assessment: post-procedure vital signs reviewed and stable Respiratory status: spontaneous breathing and respiratory function stable Cardiovascular status: blood pressure returned to baseline and stable Postop Assessment: spinal receding Anesthetic complications: no    Last Vitals:  Vitals:   05/13/18 1130 05/13/18 1145  BP: 140/67 133/69  Pulse: 63 60  Resp: 19 13  Temp:    SpO2: 100% 98%    Last Pain:  Vitals:   05/13/18 1145  TempSrc:   PainSc: 3                  Keelon Zurn DANIEL

## 2018-05-13 NOTE — Evaluation (Signed)
Physical Therapy Evaluation Patient Details Name: Danielle [name] MRN: [id number] DOB: Jul 21, 1945 Today's Date: 05/13/2018   History of Present Illness  72 yo female s/p R TKR on 05/13/18. PMH includes CHF, OA, breast cancer s/p radiation, chemo, and mastectomy, CAD, DMII, dyspnea, HTN, obesity, OSA, pacemaker.   Clinical Impression   Pt presents with R knee pain, decreased R knee ROM, increased time and effort to perform mobility tasks, and decreased tolerance for ambulation due to pain. Pt to benefit from acute PT to address deficits. Pt ambulated 12 ft in room with min guard assist, pt limited by pain. Pt educated on quad sets (5-10/hour), ankle pumps (20/hour), and heel slides (5-10/hour) to perform this afternoon/evening to lessen stiffness and increase circulation, to pt's tolerance and limited by pain. PT to progress mobility as tolerated, and will continue to follow acutely.      Follow Up Recommendations Follow surgeon's recommendation for DC plan and follow-up therapies;Supervision for mobility/OOB    Equipment Recommendations  Rolling walker with 5" wheels    Recommendations for Other Services       Precautions / Restrictions Precautions Precautions: ICD/Pacemaker;Fall Restrictions Weight Bearing Restrictions: No Other Position/Activity Restrictions: WBAT       Mobility  Bed Mobility Overal bed mobility: Needs Assistance Bed Mobility: Supine to Sit     Supine to sit: Min guard;HOB elevated     General bed mobility comments: Min guard for safety. Verbal cuing for sequencing to EOB, increased time and effort.   Transfers Overall transfer level: Needs assistance Equipment used: Rolling walker (2 wheeled) Transfers: Sit to/from Stand Sit to Stand: Min guard;From elevated surface         General transfer comment: Min guard for safety. Verbal cuing for hand placement. Pt weight shifting L and R without difficulty.   Ambulation/Gait Ambulation/Gait assistance:  Min guard Gait Distance (Feet): 12 Feet Assistive device: Rolling walker (2 wheeled) Gait Pattern/deviations: Step-to pattern;Decreased stance time - right;Decreased weight shift to right Gait velocity: decr    General Gait Details: Min guard for safety, close guard of RLE to watch for buckling (none present). Verbal cuing for sequencing, placement in RW, turning.   Stairs            Wheelchair Mobility    Modified Rankin (Stroke Patients Only)       Balance Overall balance assessment: Mild deficits observed, not formally tested                                           Pertinent Vitals/Pain Pain Assessment: 0-10 Pain Score: 5  Pain Location: R knee  Pain Descriptors / Indicators: Sore Pain Intervention(s): Limited activity within patient's tolerance;Repositioned;Ice applied;Monitored during session;Premedicated before session    Home Living Family/patient expects to be discharged to:: Private residence Living Arrangements: Other relatives(pt's grandsons live with pt at the moment ) Available Help at Discharge: Family;Available PRN/intermittently Type of Home: Apartment Home Access: Level entry     Home Layout: One level Home Equipment: Cane - single point;Walker - 4 wheels      Prior Function Level of Independence: Independent         Comments: PT reports walking short distances in apartment. Pt reports using scooter in grocery store. Pt reports having difficulty standing to cook.      Hand Dominance   Dominant Hand: Right    Extremity/Trunk Assessment   Upper  Extremity Assessment Upper Extremity Assessment: Overall WFL for tasks assessed    Lower Extremity Assessment Lower Extremity Assessment: Generalized weakness;RLE deficits/detail RLE Deficits / Details: suspected post-surgical weakness; able to perform ankle pumps, SLR with <10* quad lag, quad sets, heel slides with assist RLE Sensation: WNL    Cervical / Trunk  Assessment Cervical / Trunk Assessment: Normal  Communication   Communication: No difficulties  Cognition Arousal/Alertness: Awake/alert Behavior During Therapy: WFL for tasks assessed/performed Overall Cognitive Status: Within Functional Limits for tasks assessed                                        General Comments      Exercises Total Joint Exercises Goniometric ROM: knee aarom 10-80*, limited by pain    Assessment/Plan    PT Assessment Patient needs continued PT services  PT Problem List Decreased strength;Pain;Decreased range of motion;Decreased activity tolerance;Decreased knowledge of use of DME;Decreased balance;Decreased mobility       PT Treatment Interventions DME instruction;Therapeutic activities;Gait training;Therapeutic exercise;Patient/family education;Balance training;Functional mobility training    PT Goals (Current goals can be found in the Care Plan section)  Acute Rehab PT Goals Patient Stated Goal: be more mobile  PT Goal Formulation: With patient Time For Goal Achievement: 05/20/18 Potential to Achieve Goals: Good    Frequency 7X/week   Barriers to discharge        Co-evaluation               AM-PAC PT "6 Clicks" Mobility  Outcome Measure Help needed turning from your back to your side while in a flat bed without using bedrails?: A Little Help needed moving from lying on your back to sitting on the side of a flat bed without using bedrails?: A Little Help needed moving to and from a bed to a chair (including a wheelchair)?: A Little Help needed standing up from a chair using your arms (e.g., wheelchair or bedside chair)?: A Little Help needed to walk in hospital room?: A Little Help needed climbing 3-5 steps with a railing? : A Lot 6 Click Score: 17    End of Session Equipment Utilized During Treatment: Gait belt Activity Tolerance: Patient tolerated treatment well Patient left: in chair;with chair alarm set;with  call bell/phone within reach;with family/visitor present(SCDs off for break during shift change) Nurse Communication: Mobility status PT Visit Diagnosis: Other abnormalities of gait and mobility (R26.89);Difficulty in walking, not elsewhere classified (R26.2)    Time: 1830-1905 PT Time Calculation (min) (ACUTE ONLY): 35 min   Charges:   PT Evaluation $PT Eval Low Complexity: 1 Low PT Treatments $Gait Training: 8-22 mins        Julien Girt, PT Acute Rehabilitation Services Pager [phone]  Office [phone]   Danielle [name] 05/13/2018, 7:39 PM

## 2018-05-13 NOTE — H&P (Signed)
PREOPERATIVE H&P  Chief Complaint: right knee pain  HPI: Danielle [name] is a 72 y.o. female who presents for preoperative history and physical with a diagnosis of right knee osteoarthritis. Symptoms are rated as moderate to severe, and have been worsening.  This is significantly impairing activities of daily living.  She has elected for surgical management.   She has failed injections, activity modification, anti-inflammatories, and assistive devices.  Preoperative X-rays demonstrate end stage degenerative changes with osteophyte formation, loss of joint space, subchondral sclerosis.   Past Medical History:  Diagnosis Date  . Acute combined systolic and diastolic congestive heart failure ([place]) 02/05/2017  . Arthritis   . Blood dyscrasia    per pt-has small blood cells-appears as if anemic  . Breast cancer ([place])   . Cancer [facility])    breast  left  . CHF (congestive heart failure) ([place])   . Complication of anesthesia    difficult to awaken from per pt  . Coronary artery disease   . Diabetes mellitus without complication ([place])    type 2  . Dyspnea   . Essential hypertension   . Heart disease   . Hypothyroidism   . Obesity (BMI 30-39.9) 11/11/2017  . OSA (obstructive sleep apnea) 02/18/2017    severe obstructive sleep apnea with an AHI of 75.6/h and mild central sleep apnea with a CAI of 7.7/h.  Oxygen saturations dropped as low as 82%.   He is on CPAP at 9 cm H2O.  . Personal history of chemotherapy   . Personal history of radiation therapy   . Presence of permanent cardiac pacemaker   . Sleep apnea   . Thyroid disease    hypothyroid   Past Surgical History:  Procedure Laterality Date  . BREAST BIOPSY    . BREAST LUMPECTOMY Left    1985  . BREAST LUMPECTOMY WITH NEEDLE LOCALIZATION AND AXILLARY LYMPH NODE DISSECTION  1985  . BREAST SURGERY Left    partial mastectomy  . CORONARY STENT PLACEMENT  2010, 2012,2017    2 done 2010 and 1 replaced 2012 and 2 replaced in 2017  . EYE  SURGERY Bilateral    bilateral lens implant  . INSERT / REPLACE / REMOVE PACEMAKER  07/19/2014  . REPLACEMENT TOTAL KNEE Left 09/2012  . THYROIDECTOMY, PARTIAL  1978  . TONSILLECTOMY     Social History   Socioeconomic History  . Marital status: Widowed    Spouse name: Not on file  . Number of children: 3  . Years of education: Not on file  . Highest education level: Some college, no degree  Occupational History  . Occupation: retired  Scientific laboratory technician  . Financial resource strain: Not hard at all  . Food insecurity:    Worry: Never true    Inability: Never true  . Transportation needs:    Medical: No    Non-medical: No  Tobacco Use  . Smoking status: Former Smoker    Packs/day: 1.00    Years: 22.00    Pack years: 22.00    Types: Cigarettes    Last attempt to quit: 1983    Years since quitting: 36.9  . Smokeless tobacco: Never Used  Substance and Sexual Activity  . Alcohol use: Yes    Comment: ocassional  . Drug use: No  . Sexual activity: Not Currently    Birth control/protection: None, Post-menopausal  Lifestyle  . Physical activity:    Days per week: Not on file    Minutes per session: Not on  file  . Stress: Not on file  Relationships  . Social connections:    Talks on phone: Not on file    Gets together: Not on file    Attends religious service: Not on file    Active member of club or organization: Not on file    Attends meetings of clubs or organizations: Not on file    Relationship status: Not on file  Other Topics Concern  . Not on file  Social History Narrative  . Not on file   Family History  Problem Relation Age of Onset  . Multiple myeloma Mother   . Heart disease Father   . Other Sister   . Heart disease Brother   . Diabetes Sister   . Other Sister    Allergies  Allergen Reactions  . Other Other (See Comments)    Blood products  . Sulfa Antibiotics Rash  . Uloric [Febuxostat] Rash   Prior to Admission medications   Medication Sig Start  Date End Date Taking? Authorizing Provider  acetaminophen (TYLENOL) 650 MG CR tablet Take 650 mg by mouth every 8 (eight) hours as needed for pain.   Yes [provider]  allopurinol (ZYLOPRIM) 100 MG tablet Take 1 tablet (100 mg total) by mouth daily. 11/08/17  Yes Georgiana Shore, NP  apixaban (ELIQUIS) 5 MG TABS tablet Take 1 tablet (5 mg total) by mouth 2 (two) times daily. 04/03/18  Yes Sherran Needs, NP  atorvastatin (LIPITOR) 80 MG tablet Take 1 tablet (80 mg total) by mouth at bedtime. 06/14/17  Yes Bensimhon, Shaune Pascal, MD  Biotin 10 MG CAPS Take 10 mg by mouth daily.   Yes [provider]  calcium citrate (CALCITRATE - DOSED IN MG ELEMENTAL CALCIUM) 950 MG tablet Take 1 tablet by mouth 2 (two) times daily.    Yes [provider]  carvedilol (COREG) 25 MG tablet Take 1 tablet (25 mg total) by mouth 2 (two) times daily. 01/24/18 05/09/18 Yes Bensimhon, Shaune Pascal, MD  CHONDROITIN SULFATE PO Take 250 mg by mouth daily.   Yes [provider]  empagliflozin (JARDIANCE) 10 MG TABS tablet Take 10 mg by mouth daily. 02/13/18  Yes Bensimhon, Shaune Pascal, MD  ferrous sulfate 325 (65 FE) MG tablet Take 325 mg by mouth 2 (two) times daily with a meal.   Yes [provider]  gabapentin (NEURONTIN) 300 MG capsule Take 300 mg by mouth daily.    Yes [provider]  insulin glargine (LANTUS) 100 UNIT/ML injection Inject 30 Units into the skin 2 (two) times daily.   Yes [provider]  isosorbide mononitrate (IMDUR) 30 MG 24 hr tablet Take 1 tablet (30 mg total) by mouth daily. 05/31/17  Yes Bensimhon, Shaune Pascal, MD  Lactobacillus (ACIDOPHILUS PO) Take 1 capsule by mouth daily.   Yes [provider]  levothyroxine (SYNTHROID, LEVOTHROID) 75 MCG tablet Take 75 mcg by mouth daily before breakfast.   Yes [provider]  losartan (COZAAR) 50 MG tablet Take 1 tablet (50 mg total) by mouth daily. 02/20/18  Yes Bensimhon, Shaune Pascal, MD   metFORMIN (GLUCOPHAGE) 500 MG tablet Take 500 mg by mouth 2 (two) times daily with a meal.    Yes [provider]  Multiple Vitamins-Minerals (ALIVE ONCE DAILY WOMENS 50+ PO) Take 1 tablet by mouth daily.   Yes [provider]  nitroGLYCERIN (NITROSTAT) 0.4 MG SL tablet DISSOLVE ONE TABLET UNDER THE TONGUE EVERY 5 MINUTES AS NEEDED FOR CHEST  PAIN. 01/14/18  Yes Camnitz, Will Hassell Done, MD  omeprazole (PRILOSEC) 20 MG capsule Take 20 mg by mouth daily.    Yes [provider]  potassium chloride (K-DUR,KLOR-CON) 10 MEQ tablet Take 2 tablets (20 mEq total) by mouth daily. 01/24/18  Yes Bensimhon, Shaune Pascal, MD  spironolactone (ALDACTONE) 25 MG tablet Take 1 tablet (25 mg total) by mouth every other day. 06/14/17  Yes Bensimhon, Shaune Pascal, MD  torsemide (DEMADEX) 20 MG tablet Take 1 tablet (20 mg total) by mouth 2 (two) times daily. 11/22/17  Yes Georgiana Shore, NP  traMADol (ULTRAM) 50 MG tablet Take 50 mg by mouth every 8 (eight) hours as needed. for pain 03/19/18  Yes [provider]  Travoprost, BAK Free, (TRAVATAN) 0.004 % SOLN ophthalmic solution Place 1 drop into both eyes daily.   Yes [provider]  celecoxib (CELEBREX) 200 MG capsule Take 200 mg by mouth daily as needed for mild pain.     [provider]     Positive ROS: All other systems have been reviewed and were otherwise negative with the exception of those mentioned in the HPI and as above.  Physical Exam: General: Alert, no acute distress Cardiovascular: No pedal edema Respiratory: No cyanosis, no use of accessory musculature GI: No organomegaly, abdomen is soft and non-tender Skin: No lesions in the area of chief complaint Neurologic: Sensation intact distally Psychiatric: Patient is competent for consent with normal mood and affect Lymphatic: No axillary or cervical lymphadenopathy  MUSCULOSKELETAL: right knee with crepitance, effusion, ROM 10-90  Assessment: Right knee  osteoarthritis   Plan: Plan for Procedure(s): TOTAL KNEE ARTHROPLASTY  The risks benefits and alternatives were discussed with the patient including but not limited to the risks of nonoperative treatment, versus surgical intervention including infection, bleeding, nerve injury,  blood clots, cardiopulmonary complications, morbidity, mortality, among others, and they were willing to proceed.   Anticipated LOS equal to or greater than 2 midnights due to - Age 5 and older with one or more of the following:  - Obesity  - Expected need for hospital services (PT, OT, Nursing) required for safe  discharge  - Anticipated need for postoperative skilled nursing care or inpatient rehab  - Active co-morbidities: Diabetes, Coronary Artery Disease and Heart Failure     Johnny Bridge, MD Cell [phone]   05/13/2018 7:04 AM

## 2018-05-13 NOTE — Anesthesia Procedure Notes (Addendum)
Anesthesia Regional Block: Adductor canal block   Pre-Anesthetic Checklist: ,, timeout performed, Correct Patient, Correct Site, Correct Laterality, Correct Procedure, Correct Position, site marked, Risks and benefits discussed,  Surgical consent,  Pre-op evaluation,  At surgeon's request and post-op pain management  Laterality: Right  Prep: chloraprep       Needles:  Injection technique: Single-shot  Needle Type: Stimulator Needle - 80     Needle Length: 10cm  Needle Gauge: 21     Additional Needles:   Narrative:  Start time: 05/13/2018 6:54 AM End time: 05/13/2018 7:04 AM Injection made incrementally with aspirations every 5 mL.  Performed by: Personally

## 2018-05-13 NOTE — Anesthesia Procedure Notes (Signed)
Spinal  Patient location during procedure: OR Start time: 05/13/2018 7:28 AM End time: 05/13/2018 7:34 AM Staffing Anesthesiologist: Duane Boston, MD Performed: anesthesiologist  Preanesthetic Checklist Completed: patient identified, surgical consent, pre-op evaluation, timeout performed, IV checked, risks and benefits discussed and monitors and equipment checked Spinal Block Patient position: sitting Prep: DuraPrep Patient monitoring: cardiac monitor, continuous pulse ox and blood pressure Approach: midline Location: L2-3 Injection technique: single-shot Needle Needle type: Pencan  Needle gauge: 24 G Needle length: 9 cm Additional Notes Functioning IV was confirmed and monitors were applied. Sterile prep and drape, including hand hygiene and sterile gloves were used. The patient was positioned and the spine was prepped. The skin was anesthetized with lidocaine.  Free flow of clear CSF was obtained prior to injecting local anesthetic into the CSF.  The spinal needle aspirated freely following injection.  The needle was carefully withdrawn.  The patient tolerated the procedure well.

## 2018-05-13 NOTE — Op Note (Signed)
DATE OF SURGERY:  05/13/2018 TIME: 9:27 AM  PATIENT NAME:  Danielle [name]   AGE: 72 y.o.    PRE-OPERATIVE DIAGNOSIS:  Right knee primary localized osteoarthritis  POST-OPERATIVE DIAGNOSIS:  Same  PROCEDURE:  Total Knee Arthroplasty  SURGEON:  Johnny Bridge, MD   ASSISTANT:  Joya Gaskins, OPA-C, present and scrubbed throughout the case, critical for assistance with exposure, retraction, instrumentation, and closure.   OPERATIVE IMPLANTS: Biomet Vanguard Fixed Bearing Posterior Stabilized Femur size 70, Tibia size 71, Patella size 37 3-peg oval button, with a 10 mm polyethylene insert.   PREOPERATIVE INDICATIONS:  Danielle [name] is a 72 y.o. year old female with end stage bone on bone degenerative arthritis of the knee who failed conservative treatment, including injections, antiinflammatories, activity modification, and assistive devices, and had significant impairment of their activities of daily living, and elected for Total Knee Arthroplasty.   The risks, benefits, and alternatives were discussed at length including but not limited to the risks of infection, bleeding, nerve injury, stiffness, blood clots, the need for revision surgery, cardiopulmonary complications, among others, and they were willing to proceed.  OPERATIVE FINDINGS AND UNIQUE ASPECTS OF THE CASE: Advanced osteoarthritis with calcification and deposition but could have been from gouty disease in all of the compartments.  Significant hypertrophic osteophyte on the medial and patellofemoral joints.  She had almost no medial meniscus, and had fairly significant cupping on the medial tibial condyle.  I made a fairly aggressive cut on the tibia, and even despite this I only ended up with a 10.  I had excellent balance however.  The knee was tight, and access in the beginning was difficult.  The femur was much taller than it was wide, and I used a size 70 although I just barely notched on the lateral aspect of the distal  femur.  ESTIMATED BLOOD LOSS: 100 mL  OPERATIVE DESCRIPTION:  The patient was brought to the operative room and placed in a supine position.  Spinal anesthesia was administered.  IV antibiotics were given.  The lower extremity was prepped and draped in the usual sterile fashion.  Time out was performed.  The leg was elevated and exsanguinated and the tourniquet was inflated.  Anterior quadriceps tendon splitting approach was performed.  The patella was everted and osteophytes were removed.  The anterior horn of the medial and lateral meniscus was removed.   The distal femur was opened with the drill and the intramedullary distal femoral cutting jig was utilized, set at 5 degrees resecting 9 mm off the distal femur.  Care was taken to protect the collateral ligaments.  Then the extramedullary tibial cutting jig was utilized making the appropriate cut using the anterior tibial crest as a reference building in appropriate posterior slope.  Care was taken during the cut to protect the medial and collateral ligaments.  The proximal tibia was removed along with the posterior horns of the menisci.  The PCL was sacrificed.    The extensor gap was measured and was approximately 30mm.    The distal femoral sizing jig was applied, taking care to avoid notching.  Then the 4-in-1 cutting jig was applied and the anterior and posterior femur was cut, along with the chamfer cuts.  All posterior osteophytes were removed.  The flexion gap was then measured and was symmetric with the extension gap.  I completed the distal femoral preparation using the appropriate jig to prepare the box.  The patella was then measured, and cut with the saw.  The thickness before the cut was 24 and after the cut was 15.  The proximal tibia sized and prepared accordingly with the reamer and the punch, and then all components were trialed with the 21mm poly insert.  The knee was found to have excellent balance and full motion.     The above named components were then cemented into place and all excess cement was removed.  The real polyethylene implant was placed.  After the cement had cured I released the tourniquet and confirmed excellent hemostasis with no major posterior vessel injury.    The knee was easily taken through a range of motion and the patella tracked well and the knee irrigated copiously and the parapatellar and subcutaneous tissue closed with vicryl, and monocryl with steri strips for the skin.  The wounds were injected with marcaine, and dressed with sterile gauze and the patient was awakened and returned to the PACU in stable and satisfactory condition.  There were no complications.  Total tourniquet time was ~90 minutes.

## 2018-05-13 NOTE — Care Plan (Signed)
Ortho Bundle Case Management Note  Patient Details  Name: Danielle [name] MRN: [id number] Date of Birth: 04-18-1946   Spoke with patient prior to surgery. She will discharge to home with family and HHPT. Equipment ordered for delivery to hospital. Questions answered, choice offered.                  DME Arranged:  Walker rolling, Bedside commode DME Agency:  [employer]  HH Arranged:  PT [place] Agency:  [facility]  Additional Comments: Please contact me with any questions of if this plan should need to change.  [name],  [place] [facility]  [phone] 05/13/2018, 8:34 AM

## 2018-05-14 LAB — CBC
HCT: 27.4 % — ABNORMAL LOW (ref 36.0–46.0)
Hemoglobin: 8.1 g/dL — ABNORMAL LOW (ref 12.0–15.0)
MCH: 20.3 pg — ABNORMAL LOW (ref 26.0–34.0)
MCHC: 29.6 g/dL — ABNORMAL LOW (ref 30.0–36.0)
MCV: 68.5 fL — ABNORMAL LOW (ref 80.0–100.0)
Platelets: 173 10*3/uL (ref 150–400)
RBC: 4 MIL/uL (ref 3.87–5.11)
RDW: 17.1 % — ABNORMAL HIGH (ref 11.5–15.5)
WBC: 10.4 10*3/uL (ref 4.0–10.5)
nRBC: 0 % (ref 0.0–0.2)

## 2018-05-14 LAB — BASIC METABOLIC PANEL
Anion gap: 10 (ref 5–15)
BUN: 43 mg/dL — AB (ref 8–23)
CO2: 25 mmol/L (ref 22–32)
Calcium: 8.7 mg/dL — ABNORMAL LOW (ref 8.9–10.3)
Chloride: 102 mmol/L (ref 98–111)
Creatinine, Ser: 2.28 mg/dL — ABNORMAL HIGH (ref 0.44–1.00)
GFR calc Af Amer: 24 mL/min — ABNORMAL LOW (ref >60)
GFR calc non Af Amer: 21 mL/min — ABNORMAL LOW (ref >60)
Glucose, Bld: 123 mg/dL — ABNORMAL HIGH (ref 70–99)
POTASSIUM: 4.4 mmol/L (ref 3.5–5.1)
Sodium: 137 mmol/L (ref 135–145)

## 2018-05-14 LAB — GLUCOSE, CAPILLARY
GLUCOSE-CAPILLARY: 113 mg/dL — AB (ref 70–99)
Glucose-Capillary: 164 mg/dL — ABNORMAL HIGH (ref 70–99)
Glucose-Capillary: 154 mg/dL — ABNORMAL HIGH (ref 70–99)
Glucose-Capillary: 107 mg/dL — ABNORMAL HIGH (ref 70–99)
Glucose-Capillary: 133 mg/dL — ABNORMAL HIGH (ref 70–99)

## 2018-05-14 MED ORDER — RISAQUAD PO CAPS
1.0000 | ORAL_CAPSULE | Freq: Every day | ORAL | Status: DC
  Administered 2018-05-14 – 2018-05-17 (×4): 1 via ORAL
  Filled 2018-05-14 (×4): qty 1

## 2018-05-14 NOTE — Progress Notes (Signed)
Spoke with patient at bedside regarding d/c plans. She was under the impression that she was discharging to a facility for PT. The plan from the office CM was for home with family and HHPT. Patient states she does not have help at home, but then indicates she has a nephew who is an adult. Contacted office CM, Renee, to update her on patient concerns. She asked that I provide patient with SNF list for choice and she would f/u with her in am. Patient given list with SNF in network with [facility] highlighted. Provided patient with my contact information if she had questions.

## 2018-05-14 NOTE — Progress Notes (Signed)
CSW consult-SNF Plan: Discharge Home Health,  [facility]  Kathrin Greathouse, Marlinda Mike, MSW Clinical Social Worker  [phone] 05/14/2018  8:52 AM

## 2018-05-14 NOTE — Progress Notes (Signed)
Physical Therapy Treatment Patient Details Name: Danielle [name] MRN: [id number] DOB: Oct 12, 1945 Today's Date: 05/14/2018    History of Present Illness 72 yo female s/p R TKR on 05/13/18. PMH includes CHF, OA, breast cancer s/p radiation, chemo, and mastectomy, CAD, DMII, dyspnea, HTN, obesity, OSA, pacemaker.     PT Comments    Pt is progressing well with mobility, she ambulated 160' with RW and performed HEP with supervision.   Follow Up Recommendations  Follow surgeon's recommendation for DC plan and follow-up therapies;Supervision for mobility/OOB     Equipment Recommendations  Rolling walker with 5" wheels    Recommendations for Other Services       Precautions / Restrictions Precautions Precautions: ICD/Pacemaker;Fall Restrictions Weight Bearing Restrictions: No Other Position/Activity Restrictions: WBAT     Mobility  Bed Mobility                  Transfers Overall transfer level: Needs assistance Equipment used: Rolling walker (2 wheeled) Transfers: Sit to/from Stand Sit to Stand: Min guard;From elevated surface         General transfer comment: Min guard for safety. Verbal cuing for hand placement.   Ambulation/Gait Ambulation/Gait assistance: Min guard Gait Distance (Feet): 160 Feet Assistive device: Rolling walker (2 wheeled) Gait Pattern/deviations: Step-to pattern;Decreased stance time - right;Decreased weight shift to right Gait velocity: decr    General Gait Details: no LOB, good sequencing, no buckling   Stairs             Wheelchair Mobility    Modified Rankin (Stroke Patients Only)       Balance Overall balance assessment: Mild deficits observed, not formally tested                                          Cognition Arousal/Alertness: Awake/alert Behavior During Therapy: WFL for tasks assessed/performed Overall Cognitive Status: Within Functional Limits for tasks assessed                                         Exercises Total Joint Exercises Ankle Circles/Pumps: AROM;Both;10 reps;Supine Quad Sets: AROM;Right;5 reps;Supine Short Arc Quad: AROM;Right;10 reps;Supine Heel Slides: AAROM;Right;10 reps;Supine Straight Leg Raises: AROM;Right;10 reps;Supine    General Comments        Pertinent Vitals/Pain Pain Score: 7  Pain Location: R knee  Pain Descriptors / Indicators: Sore Pain Intervention(s): Limited activity within patient's tolerance;Monitored during session;Premedicated before session;Ice applied    Home Living                      Prior Function            PT Goals (current goals can now be found in the care plan section) Acute Rehab PT Goals Patient Stated Goal: be more mobile, be able to cook PT Goal Formulation: With patient Time For Goal Achievement: 05/20/18 Potential to Achieve Goals: Good Progress towards PT goals: Progressing toward goals    Frequency    7X/week      PT Plan Current plan remains appropriate    Co-evaluation              AM-PAC PT "6 Clicks" Mobility   Outcome Measure  Help needed turning from your back to your side while in a flat bed without using  bedrails?: None Help needed moving from lying on your back to sitting on the side of a flat bed without using bedrails?: A Little Help needed moving to and from a bed to a chair (including a wheelchair)?: None Help needed standing up from a chair using your arms (e.g., wheelchair or bedside chair)?: None Help needed to walk in hospital room?: A Little Help needed climbing 3-5 steps with a railing? : A Lot 6 Click Score: 20    End of Session Equipment Utilized During Treatment: Gait belt Activity Tolerance: Patient tolerated treatment well Patient left: in chair;with chair alarm set;with call bell/phone within reach;with family/visitor present(SCDs off for break during shift change) Nurse Communication: Mobility status PT Visit Diagnosis: Other  abnormalities of gait and mobility (R26.89);Difficulty in walking, not elsewhere classified (R26.2)     Time: 0301-3143 PT Time Calculation (min) (ACUTE ONLY): 27 min  Charges:  $Gait Training: 8-22 mins $Therapeutic Exercise: 8-22 mins                     Blondell Reveal Kistler PT 05/14/2018  Acute Rehabilitation Services Pager [phone] Office [phone]

## 2018-05-14 NOTE — Progress Notes (Addendum)
Patient ID: Danielle [name], female   DOB: [birth year], 72 y.o.   MRN: [id number]    mildSubjective:  Patient reports pain as mild.  Patient in bed and in no acute distress.  Denies any CP or SOB  Objective:   VITALS:   Vitals:   05/13/18 1947 05/13/18 2213 05/14/18 0156 05/14/18 0524  BP:  140/70 (!) 150/74 (!) 144/55  Pulse:  68 66 71  Resp: 18 16 12 16   Temp:  98 F (36.7 C) 97.6 F (36.4 C) 98.3 F (36.8 C)  TempSrc:  Oral Oral Oral  SpO2:  99% 100% 100%  Weight:      Height:        ABD soft Sensation intact distally Dorsiflexion/Plantar flexion intact Incision: dressing C/D/I and no drainage   Lab Results  Component Value Date   WBC 10.4 05/14/2018   HGB 8.1 (L) 05/14/2018   HCT 27.4 (L) 05/14/2018   MCV 68.5 (L) 05/14/2018   PLT 173 05/14/2018   BMET    Component Value Date/Time   NA 137 05/14/2018 0355   K 4.4 05/14/2018 0355   CL 102 05/14/2018 0355   CO2 25 05/14/2018 0355   GLUCOSE 123 (H) 05/14/2018 0355   BUN 43 (H) 05/14/2018 0355   CREATININE 2.28 (H) 05/14/2018 0355   CALCIUM 8.7 (L) 05/14/2018 0355   GFRNONAA 21 (L) 05/14/2018 0355   GFRAA 24 (L) 05/14/2018 0355     Assessment/Plan: 1 Day Post-Op   Principal Problem:   Primary localized osteoarthritis of right knee   Advance diet Up with therapy Plan for discharge tomorrow WBAT Dry dressing PRN Chronic anemia continue to monitor    Lunette Stands 05/14/2018, 7:36 AM  Acute kidney failure on CKD Stage III - eGFR 30-59,  Acute on chronic kidney disease, renal dysfunction, kidney injury.  Will d/c the low dose toradol, hydrate and observe.  GFR 28  Anemia, patient refuses blood products and is asymptomatic.   Marchia Bond, MD Cell [phone]

## 2018-05-14 NOTE — Progress Notes (Signed)
Physical Therapy Treatment Patient Details Name: Danielle [name] MRN: [id number] DOB: December 05, 1945 Today's Date: 05/14/2018    History of Present Illness 72 yo female s/p R TKR on 05/13/18. PMH includes CHF, OA, breast cancer s/p radiation, chemo, and mastectomy, CAD, DMII, dyspnea, HTN, obesity, OSA, pacemaker.     PT Comments    Pt is progressing well with mobility, she ambulated 200' with RW and performed TKA HEP with supervision. Good progress expected.   Follow Up Recommendations  Follow surgeon's recommendation for DC plan and follow-up therapies;Supervision for mobility/OOB     Equipment Recommendations  Rolling walker with 5" wheels    Recommendations for Other Services       Precautions / Restrictions Precautions Precautions: ICD/Pacemaker;Fall Restrictions Weight Bearing Restrictions: No Other Position/Activity Restrictions: WBAT     Mobility  Bed Mobility Overal bed mobility: Modified Independent Bed Mobility: Supine to Sit     Supine to sit: HOB elevated;Modified independent (Device/Increase time)        Transfers Overall transfer level: Needs assistance Equipment used: Rolling walker (2 wheeled) Transfers: Sit to/from Stand Sit to Stand: Min guard;From elevated surface         General transfer comment: Min guard for safety. Verbal cuing for hand placement.   Ambulation/Gait Ambulation/Gait assistance: Min guard Gait Distance (Feet): 200 Feet Assistive device: Rolling walker (2 wheeled) Gait Pattern/deviations: Step-to pattern;Decreased stance time - right;Decreased weight shift to right Gait velocity: decr    General Gait Details: no LOB, good sequencing, no buckling   Stairs             Wheelchair Mobility    Modified Rankin (Stroke Patients Only)       Balance Overall balance assessment: Mild deficits observed, not formally tested                                          Cognition Arousal/Alertness:  Awake/alert Behavior During Therapy: WFL for tasks assessed/performed Overall Cognitive Status: Within Functional Limits for tasks assessed                                        Exercises Total Joint Exercises Ankle Circles/Pumps: AROM;Both;10 reps;Supine Quad Sets: AROM;Right;5 reps;Supine Short Arc Quad: AROM;Right;10 reps;Supine Heel Slides: AAROM;Right;10 reps;Supine Straight Leg Raises: AROM;Right;10 reps;Supine Knee Flexion: AROM;AAROM;Right;10 reps;Seated Goniometric ROM: 0-75* aarom    General Comments        Pertinent Vitals/Pain Pain Score: 7  Pain Location: R knee  Pain Descriptors / Indicators: Sore Pain Intervention(s): Limited activity within patient's tolerance;Monitored during session;Premedicated before session;RN gave pain meds during session;Patient requesting pain meds-RN notified;Ice applied    Home Living                      Prior Function            PT Goals (current goals can now be found in the care plan section) Acute Rehab PT Goals Patient Stated Goal: be more mobile, be able to cook PT Goal Formulation: With patient Time For Goal Achievement: 05/20/18 Potential to Achieve Goals: Good Progress towards PT goals: Progressing toward goals    Frequency    7X/week      PT Plan Current plan remains appropriate    Co-evaluation  AM-PAC PT "6 Clicks" Mobility   Outcome Measure  Help needed turning from your back to your side while in a flat bed without using bedrails?: None Help needed moving from lying on your back to sitting on the side of a flat bed without using bedrails?: A Little Help needed moving to and from a bed to a chair (including a wheelchair)?: None Help needed standing up from a chair using your arms (e.g., wheelchair or bedside chair)?: None Help needed to walk in hospital room?: A Little Help needed climbing 3-5 steps with a railing? : A Lot 6 Click Score: 20    End of  Session Equipment Utilized During Treatment: Gait belt Activity Tolerance: Patient tolerated treatment well Patient left: in chair;with chair alarm set;with call bell/phone within reach(SCDs off for break during shift change) Nurse Communication: Mobility status PT Visit Diagnosis: Other abnormalities of gait and mobility (R26.89);Difficulty in walking, not elsewhere classified (R26.2)     Time: 7564-3329 PT Time Calculation (min) (ACUTE ONLY): 41 min  Charges:  $Gait Training: 8-22 mins $Therapeutic Exercise: 8-22 mins $Therapeutic Activity: 8-22 mins                    Blondell Reveal Kistler PT 05/14/2018  Acute Rehabilitation Services Pager [phone] Office [phone]

## 2018-05-14 NOTE — Plan of Care (Signed)
  Problem: Education: Goal: Knowledge of the prescribed therapeutic regimen will improve Outcome: Progressing Goal: Individualized Educational Video(s) Outcome: Progressing   Problem: Activity: Goal: Ability to avoid complications of mobility impairment will improve Outcome: Progressing   Problem: Clinical Measurements: Goal: Postoperative complications will be avoided or minimized Outcome: Progressing   Problem: Education: Goal: Knowledge of General Education information will improve Description Including pain rating scale, medication(s)/side effects and non-pharmacologic comfort measures Outcome: Progressing   Problem: Health Behavior/Discharge Planning: Goal: Ability to manage health-related needs will improve Outcome: Progressing   Problem: Clinical Measurements: Goal: Ability to maintain clinical measurements within normal limits will improve Outcome: Progressing Goal: Will remain free from infection Outcome: Progressing Goal: Diagnostic test results will improve Outcome: Progressing   Problem: Activity: Goal: Risk for activity intolerance will decrease Outcome: Progressing   Problem: Elimination: Goal: Will not experience complications related to bowel motility Outcome: Progressing   Problem: Safety: Goal: Ability to remain free from injury will improve Outcome: Progressing   Problem: Skin Integrity: Goal: Risk for impaired skin integrity will decrease Outcome: Progressing

## 2018-05-14 NOTE — Plan of Care (Signed)
Plan of care reviewed and discussed with the patient. 

## 2018-05-15 ENCOUNTER — Encounter (HOSPITAL_COMMUNITY): Payer: Self-pay | Admitting: Orthopedic Surgery

## 2018-05-15 DIAGNOSIS — N179 Acute kidney failure, unspecified: Secondary | ICD-10-CM | POA: Diagnosis not present

## 2018-05-15 HISTORY — DX: Acute kidney failure, unspecified: N17.9

## 2018-05-15 LAB — GLUCOSE, CAPILLARY
GLUCOSE-CAPILLARY: 94 mg/dL (ref 70–99)
Glucose-Capillary: 142 mg/dL — ABNORMAL HIGH (ref 70–99)
Glucose-Capillary: 77 mg/dL (ref 70–99)
Glucose-Capillary: 78 mg/dL (ref 70–99)

## 2018-05-15 LAB — CBC
HCT: 26.5 % — ABNORMAL LOW (ref 36.0–46.0)
Hemoglobin: 7.9 g/dL — ABNORMAL LOW (ref 12.0–15.0)
MCH: 20.7 pg — ABNORMAL LOW (ref 26.0–34.0)
MCHC: 29.8 g/dL — ABNORMAL LOW (ref 30.0–36.0)
MCV: 69.4 fL — ABNORMAL LOW (ref 80.0–100.0)
NRBC: 0 % (ref 0.0–0.2)
Platelets: 172 10*3/uL (ref 150–400)
RBC: 3.82 MIL/uL — ABNORMAL LOW (ref 3.87–5.11)
RDW: 17.1 % — ABNORMAL HIGH (ref 11.5–15.5)
WBC: 9.7 10*3/uL (ref 4.0–10.5)

## 2018-05-15 LAB — BASIC METABOLIC PANEL
ANION GAP: 10 (ref 5–15)
BUN: 52 mg/dL — ABNORMAL HIGH (ref 8–23)
CO2: 26 mmol/L (ref 22–32)
Calcium: 8.9 mg/dL (ref 8.9–10.3)
Chloride: 102 mmol/L (ref 98–111)
Creatinine, Ser: 2.05 mg/dL — ABNORMAL HIGH (ref 0.44–1.00)
GFR calc Af Amer: 28 mL/min — ABNORMAL LOW (ref >60)
GFR calc non Af Amer: 24 mL/min — ABNORMAL LOW (ref >60)
Glucose, Bld: 95 mg/dL (ref 70–99)
Potassium: 4.3 mmol/L (ref 3.5–5.1)
Sodium: 138 mmol/L (ref 135–145)

## 2018-05-15 NOTE — Care Plan (Signed)
Lengthy conversation with patient and hospital CM. She is ambulating well with PT in house. Her apartment is one level and she currently has a grandson that lives with her. She has two daughters that are on their way to town but will only be staying "a couple of days". She is requesting SNF stay. Explained that this will have to be approved by [employer] and Dr. Mardelle Matte. She is ambulating very well and could go home. We discussed with her the need to work on a plan in the event that SNF was not approved. MD updated.   CSW will start SNF process. Information and referral sent to [facility] and rehab.    [name], [place]

## 2018-05-15 NOTE — [place] FL2 (Addendum)
[place] MEDICAID [id number] LEVEL OF CARE SCREENING TOOL     IDENTIFICATION  Patient Name: Danielle [name] Birthdate: August 08, 1945 Sex: female Admission Date (Current Location): 05/13/2018  [facility] and [place] Number:  Herbalist and Address:  [facility],  [place] [address], [place]      Provider Number: [id number]  Attending Physician Name and Address:  Marchia Bond, MD  Relative Name and Phone Number:       Current Level of Care: Hospital Recommended Level of Care: [place] Prior Approval Number:    Date Approved/Denied:   PASRR Number: [id number]  Discharge Plan: SNF    Current Diagnoses: Patient Active Problem List   Diagnosis Date Noted  . Acute kidney failure ([place]) 05/15/2018  . Primary localized osteoarthritis of right knee 05/13/2018  . (HFpEF) heart failure with preserved ejection fraction ([place]) 12/20/2017  . Obesity (BMI 30-39.9) 11/11/2017  . OSA (obstructive sleep apnea) 02/18/2017  . Essential hypertension   . Diabetes mellitus without complication ([place])   . Cancer ([place])   . Thyroid disease     Orientation RESPIRATION BLADDER Height & Weight     Self, Time, Situation, Place  Normal Continent Weight: 202 lb (91.6 kg) Height:  5\' 4"  (162.6 cm)  BEHAVIORAL SYMPTOMS/MOOD NEUROLOGICAL BOWEL NUTRITION STATUS      Continent Diet(Carb Modified)  AMBULATORY STATUS COMMUNICATION OF NEEDS Skin   Extensive Assist Verbally Normal                       Personal Care Assistance Level of Assistance  Bathing, Feeding, Dressing Bathing Assistance: Limited assistance Feeding assistance: Independent Dressing Assistance: Limited assistance     Functional Limitations Info  Sight, Hearing, Speech Sight Info: Adequate Hearing Info: Adequate Speech Info: Adequate    SPECIAL CARE FACTORS FREQUENCY  PT (By licensed PT), OT (By licensed OT)     PT Frequency: 7x/week OT Frequency: 7x/week             Contractures Contractures Info: Not present    Additional Factors Info  Psychotropic, Insulin Sliding Scale Code Status Info: Fullcode  Allergies Info: Allergies: Other, Sulfa Antibiotics, Uloric Febuxostat   Insulin Sliding Scale Info: See medication list        Current Medications (05/15/2018):  This is the current hospital active medication list Current Facility-Administered Medications  Medication Dose Route Frequency Provider Last Rate Last Dose  . 0.45 % NaCl with KCl 20 mEq / L infusion   Intravenous Continuous Marchia Bond, MD   Stopped at 05/13/18 1420  . acetaminophen (TYLENOL) tablet 325-650 mg  325-650 mg Oral Q6H PRN Marchia Bond, MD      . acidophilus (RISAQUAD) capsule 1 capsule  1 capsule Oral Daily Marchia Bond, MD   1 capsule at 05/15/18 0946  . allopurinol (ZYLOPRIM) tablet 100 mg  100 mg Oral Daily Marchia Bond, MD   100 mg at 05/15/18 0946  . alum & mag hydroxide-simeth (MAALOX/MYLANTA) 200-200-20 MG/5ML suspension 30 mL  30 mL Oral Q4H PRN Marchia Bond, MD      . apixaban Arne Cleveland) tablet 5 mg  5 mg Oral BID Marchia Bond, MD   5 mg at 05/15/18 0946  . atorvastatin (LIPITOR) tablet 80 mg  80 mg Oral QHS Marchia Bond, MD      . bisacodyl (DULCOLAX) suppository 10 mg  10 mg Rectal Daily PRN Marchia Bond, MD      . calcium citrate (CALCITRATE - dosed in  mg elemental calcium) tablet 200 mg of elemental calcium  1 tablet Oral BID Marchia Bond, MD   200 mg of elemental calcium at 05/15/18 0946  . carvedilol (COREG) tablet 25 mg  25 mg Oral BID WC Marchia Bond, MD   25 mg at 05/15/18 0825  . dexamethasone (DECADRON) injection 10 mg  10 mg Intravenous Once Marchia Bond, MD      . diphenhydrAMINE (BENADRYL) 12.5 MG/5ML elixir 12.5-25 mg  12.5-25 mg Oral Q4H PRN Marchia Bond, MD      . docusate sodium (COLACE) capsule 100 mg  100 mg Oral BID Marchia Bond, MD   100 mg at 05/15/18 0946  . ferrous sulfate tablet 325 mg  325 mg Oral BID WC Marchia Bond, MD   325 mg at 05/15/18 0825  . gabapentin (NEURONTIN) capsule 300 mg  300 mg Oral Daily Marchia Bond, MD   300 mg at 05/15/18 0946  . HYDROcodone-acetaminophen (NORCO) 7.5-325 MG per tablet 1-2 tablet  1-2 tablet Oral Q4H PRN Marchia Bond, MD   2 tablet at 05/15/18 1133  . HYDROcodone-acetaminophen (NORCO/VICODIN) 5-325 MG per tablet 1-2 tablet  1-2 tablet Oral Q4H PRN Marchia Bond, MD   1 tablet at 05/15/18 0650  . insulin glargine (LANTUS) injection 30 Units  30 Units Subcutaneous BID Marchia Bond, MD   30 Units at 05/15/18 0945  . isosorbide mononitrate (IMDUR) 24 hr tablet 30 mg  30 mg Oral Daily Marchia Bond, MD   30 mg at 05/15/18 0946  . latanoprost (XALATAN) 0.005 % ophthalmic solution 1 drop  1 drop Both Eyes QHS Marchia Bond, MD      . levothyroxine (SYNTHROID, LEVOTHROID) tablet 75 mcg  75 mcg Oral QAC breakfast Marchia Bond, MD   75 mcg at 05/15/18 0647  . losartan (COZAAR) tablet 50 mg  50 mg Oral Daily Marchia Bond, MD   50 mg at 05/15/18 0946  . magnesium citrate solution 1 Bottle  1 Bottle Oral Once PRN Marchia Bond, MD      . menthol-cetylpyridinium (CEPACOL) lozenge 3 mg  1 lozenge Oral PRN Marchia Bond, MD       Or  . phenol (CHLORASEPTIC) mouth spray 1 spray  1 spray Mouth/Throat PRN Marchia Bond, MD      . methocarbamol (ROBAXIN) tablet 500 mg  500 mg Oral Q6H PRN Marchia Bond, MD   500 mg at 05/15/18 0650   Or  . methocarbamol (ROBAXIN) 500 mg in dextrose 5 % 50 mL IVPB  500 mg Intravenous Q6H PRN Marchia Bond, MD   Stopped at 05/13/18 1103  . metoCLOPramide (REGLAN) tablet 5 mg  5 mg Oral Q8H PRN Marchia Bond, MD       Or  . metoCLOPramide (REGLAN) injection 5 mg  5 mg Intravenous Q8H PRN Marchia Bond, MD      . morphine 2 MG/ML injection 0.5-1 mg  0.5-1 mg Intravenous Q2H PRN Marchia Bond, MD   1 mg at 05/15/18 1610  . multivitamin with minerals tablet   Oral Daily Marchia Bond, MD   1 tablet at 05/15/18 0946  . nitroGLYCERIN  (NITROSTAT) SL tablet 0.4 mg  0.4 mg Sublingual Q5 min PRN Marchia Bond, MD      . ondansetron [facility]) tablet 4 mg  4 mg Oral Q6H PRN Marchia Bond, MD   4 mg at 05/15/18 0650   Or  . ondansetron (ZOFRAN) injection 4 mg  4 mg Intravenous Q6H PRN Marchia Bond, MD   4  mg at 05/14/18 2020  . pantoprazole (PROTONIX) EC tablet 80 mg  80 mg Oral Daily Marchia Bond, MD   80 mg at 05/15/18 0946  . polyethylene glycol (MIRALAX / GLYCOLAX) packet 17 g  17 g Oral Daily PRN Marchia Bond, MD      . potassium chloride SA (K-DUR,KLOR-CON) CR tablet 20 mEq  20 mEq Oral Daily Marchia Bond, MD   20 mEq at 05/15/18 0946  . spironolactone (ALDACTONE) tablet 25 mg  25 mg Oral Lorelee Market, MD   25 mg at 05/14/18 1027  . torsemide (DEMADEX) tablet 20 mg  20 mg Oral BID Marchia Bond, MD   20 mg at 05/15/18 0946  . traMADol (ULTRAM) tablet 50 mg  50 mg Oral Q8H PRN Marchia Bond, MD   50 mg at 05/15/18 0825  . zolpidem (AMBIEN) tablet 5 mg  5 mg Oral QHS PRN Marchia Bond, MD         Discharge Medications: Please see discharge summary for a list of discharge medications.  Relevant Imaging Results:  Relevant Lab Results:   Additional Information [id number]  Lia Hopping, LCSW

## 2018-05-15 NOTE — Progress Notes (Signed)
Had family conference.  Plan for dc home with hh/pt, likely sat depending on renal function and progress with PT and pain control.  Johnny Bridge, MD

## 2018-05-15 NOTE — Progress Notes (Addendum)
CSW assisting with patient discharge plan to SNF. CSW completed FL2 and sent referral to [facility] for placement. Patient clinical information under review.  1:38pm SNF started the [employer] process.   Kathrin Greathouse, Marlinda Mike, MSW Clinical Social Worker  [phone] 05/15/2018  12:17 PM

## 2018-05-15 NOTE — Progress Notes (Signed)
PHARMACIST - PHYSICIAN COMMUNICATION DR:  Mardelle Matte CONCERNING:  METFORMIN SAFE ADMINISTRATION POLICY  RECOMMENDATION: Metformin has been placed on DISCONTINUE (rejected order) STATUS and should be reordered only after any of the conditions below are ruled out.   DESCRIPTION:  The Pharmacy Committee has adopted a policy that restricts the use of metformin in hospitalized patients until all the contraindications to administration have been ruled out:  [x]  eGFR below 30 mL/min/1.32m []  Acute or chronic metabolic acidosis (including DKA) []  Shock, acute MI, sepsis, hypoxemia, dehydration []  Administration of intra-arterial iodinated contrast: impending, or completed within past 48 hours* []  Administration of intravenous iodinated contrast: impending, or completed within past 48 hours, IF any of the following risk factors are also present**: . eGFR 30 - 60 mL/min/1.735m. history of hepatic impairment, alcoholism, or heart failure  ** When metformin is stopped due to administration of iodinated contrast, recommend re-evaluating eGFR 48 hours after the imaging procedure and restarting metformin if renal function is stable and no other contraindications are present.  GrMinda DittoRPFrye Regional Medical Center2/19/2019 9:07 AM

## 2018-05-15 NOTE — Progress Notes (Signed)
Physical Therapy Treatment Patient Details Name: Danielle [name] MRN: [id number] DOB: [birth year] Today's Date: 05/15/2018    History of Present Illness 72 yo female s/p R TKR on 05/13/18. PMH includes CHF, OA, breast cancer s/p radiation, chemo, and mastectomy, CAD, DMII, dyspnea, HTN, obesity, OSA, pacemaker.     PT Comments    Pt cooperative with therex and mobilized to bathroom but N/V and c/o dizziness on return.   Follow Up Recommendations  Follow surgeon's recommendation for DC plan and follow-up therapies;Supervision for mobility/OOB     Equipment Recommendations  Rolling walker with 5" wheels    Recommendations for Other Services       Precautions / Restrictions Precautions Precautions: ICD/Pacemaker;Fall Restrictions Weight Bearing Restrictions: No Other Position/Activity Restrictions: WBAT     Mobility  Bed Mobility Overal bed mobility: Modified Independent Bed Mobility: Supine to Sit     Supine to sit: HOB elevated;Modified independent (Device/Increase time)     General bed mobility comments: Min guard for safety. Verbal cuing for sequencing to EOB, increased time and effort.   Transfers Overall transfer level: Needs assistance Equipment used: Rolling walker (2 wheeled) Transfers: Sit to/from Stand Sit to Stand: Min guard;From elevated surface         General transfer comment: Min guard for safety. Verbal cuing for hand placement.   Ambulation/Gait Ambulation/Gait assistance: Min guard Gait Distance (Feet): 20 Feet Assistive device: Rolling walker (2 wheeled) Gait Pattern/deviations: Step-to pattern;Decreased stance time - right;Decreased weight shift to right Gait velocity: decr    General Gait Details: cues for sequence, posture and position from [employer]             Wheelchair Mobility    Modified Rankin (Stroke Patients Only)       Balance Overall balance assessment: Mild deficits observed, not formally tested                                           Cognition Arousal/Alertness: Awake/alert Behavior During Therapy: WFL for tasks assessed/performed Overall Cognitive Status: Within Functional Limits for tasks assessed                                        Exercises Total Joint Exercises Ankle Circles/Pumps: AROM;Both;Supine;20 reps Quad Sets: AROM;Right;5 reps;Supine;15 reps Heel Slides: AAROM;Right;Supine;15 reps Straight Leg Raises: AROM;Right;Supine;20 reps Goniometric ROM: -8 - 75 AAROM    General Comments        Pertinent Vitals/Pain Pain Assessment: 0-10 Pain Score: 6  Pain Location: R knee  Pain Descriptors / Indicators: Sore Pain Intervention(s): Limited activity within patient's tolerance;Monitored during session;Premedicated before session    Home Living                      Prior Function            PT Goals (current goals can now be found in the care plan section) Acute Rehab PT Goals Patient Stated Goal: be more mobile, be able to cook PT Goal Formulation: With patient Time For Goal Achievement: 05/20/18 Potential to Achieve Goals: Good Progress towards PT goals: Progressing toward goals    Frequency    7X/week      PT Plan Current plan remains appropriate    Co-evaluation  AM-PAC PT "6 Clicks" Mobility   Outcome Measure  Help needed turning from your back to your side while in a flat bed without using bedrails?: None Help needed moving from lying on your back to sitting on the side of a flat bed without using bedrails?: A Little Help needed moving to and from a bed to a chair (including a wheelchair)?: A Little Help needed standing up from a chair using your arms (e.g., wheelchair or bedside chair)?: A Little Help needed to walk in hospital room?: A Little Help needed climbing 3-5 steps with a railing? : A Lot 6 Click Score: 18    End of Session Equipment Utilized During Treatment: Gait  belt Activity Tolerance: Patient limited by fatigue;Patient limited by pain;Other (comment)(N&V) Patient left: in bed;with call bell/phone within reach Nurse Communication: Mobility status PT Visit Diagnosis: Other abnormalities of gait and mobility (R26.89);Difficulty in walking, not elsewhere classified (R26.2)     Time: 1330-1402 PT Time Calculation (min) (ACUTE ONLY): 32 min  Charges:  $Gait Training: 8-22 mins $Therapeutic Exercise: 8-22 mins                     [place] Pager [phone] Office [phone]    Kyshawn Teal 05/15/2018, 4:51 PM

## 2018-05-15 NOTE — Progress Notes (Signed)
PT Cancellation Note  Patient Details Name: Breannah Kratt MRN: [id number] DOB: [birth year]   Cancelled Treatment:     PT attempted x 3 this am but deferred 2* pt in bathroom, pt in pain, pt in pain.  Will follow.   Wynne Rozak 05/15/2018, 12:10 PM

## 2018-05-15 NOTE — Progress Notes (Signed)
     Subjective:  Patient reports pain as marked.  Having difficulty with PT due to pain.    Objective:   VITALS:   Vitals:   05/14/18 2205 05/15/18 0452 05/15/18 0956 05/15/18 1422  BP: 111/64 (!) 120/54 128/62 (!) 122/55  Pulse: 66 (!) 59 68 (!) 59  Resp:  20 18 18   Temp: 97.9 F (36.6 C) 98.6 F (37 C) 97.8 F (36.6 C) 98.4 F (36.9 C)  TempSrc:  Oral Oral   SpO2: 100% 99% 96% 97%  Weight:      Height:       Dressing clean.  NVI in her foot.  Positive swelling.  Lab Results  Component Value Date   WBC 9.7 05/15/2018   HGB 7.9 (L) 05/15/2018   HCT 26.5 (L) 05/15/2018   MCV 69.4 (L) 05/15/2018   PLT 172 05/15/2018   BMET    Component Value Date/Time   NA 138 05/15/2018 0513   K 4.3 05/15/2018 0513   CL 102 05/15/2018 0513   CO2 26 05/15/2018 0513   GLUCOSE 95 05/15/2018 0513   BUN 52 (H) 05/15/2018 0513   CREATININE 2.05 (H) 05/15/2018 0513   CALCIUM 8.9 05/15/2018 0513   GFRNONAA 24 (L) 05/15/2018 0513   GFRAA 28 (L) 05/15/2018 0513     Assessment/Plan: 2 Days Post-Op   Principal Problem:   Primary localized osteoarthritis of right knee Active Problems:   Acute kidney failure (HCC)   Advance diet Up with therapy Home vs. SNF.  She wants to go to SNF.  Will reassess acute kidney failure with labs tomorrow.   Anticipated LOS equal to or greater than 2 midnights due to - Age 71 and older with one or more of the following:  - Obesity  - Expected need for hospital services (PT, OT, Nursing) required for safe  discharge  - Anticipated need for postoperative skilled nursing care or inpatient rehab  - Active co-morbidities: Chronic pain requiring opiods, Diabetes and Anemia Acute kidney failure.     Johnny Bridge 05/15/2018, 3:49 PM   Marchia Bond, MD Cell [phone]

## 2018-05-16 LAB — CBC
HCT: 26.5 % — ABNORMAL LOW (ref 36.0–46.0)
Hemoglobin: 8 g/dL — ABNORMAL LOW (ref 12.0–15.0)
MCH: 20.8 pg — ABNORMAL LOW (ref 26.0–34.0)
MCHC: 30.2 g/dL (ref 30.0–36.0)
MCV: 68.8 fL — ABNORMAL LOW (ref 80.0–100.0)
NRBC: 0 % (ref 0.0–0.2)
Platelets: 193 10*3/uL (ref 150–400)
RBC: 3.85 MIL/uL — AB (ref 3.87–5.11)
RDW: 17 % — ABNORMAL HIGH (ref 11.5–15.5)
WBC: 9.1 10*3/uL (ref 4.0–10.5)

## 2018-05-16 LAB — BASIC METABOLIC PANEL
Anion gap: 15 (ref 5–15)
BUN: 48 mg/dL — ABNORMAL HIGH (ref 8–23)
CO2: 26 mmol/L (ref 22–32)
Calcium: 9.3 mg/dL (ref 8.9–10.3)
Chloride: 97 mmol/L — ABNORMAL LOW (ref 98–111)
Creatinine, Ser: 1.81 mg/dL — ABNORMAL HIGH (ref 0.44–1.00)
GFR calc Af Amer: 32 mL/min — ABNORMAL LOW (ref >60)
GFR calc non Af Amer: 28 mL/min — ABNORMAL LOW (ref >60)
Glucose, Bld: 176 mg/dL — ABNORMAL HIGH (ref 70–99)
POTASSIUM: 4.2 mmol/L (ref 3.5–5.1)
SODIUM: 138 mmol/L (ref 135–145)

## 2018-05-16 NOTE — Care Management Important Message (Signed)
Important Message  Patient Details  Name: Danielle [name] MRN: [id number] Date of Birth: 05-22-1946   Medicare Important Message Given:  Yes    Kerin Salen 05/16/2018, 10:50 AMImportant Message  Patient Details  Name: Danielle [name] MRN: [id number] Date of Birth: 10/21/1945   Medicare Important Message Given:  Yes    Kerin Salen 05/16/2018, 10:50 AM

## 2018-05-16 NOTE — Progress Notes (Signed)
Physical Therapy Treatment Patient Details Name: Danielle [name] MRN: [id number] DOB: [birth year] Today's Date: 05/16/2018    History of Present Illness 72 yo female s/p R TKR on 05/13/18. PMH includes CHF, OA, breast cancer s/p radiation, chemo, and mastectomy, CAD, DMII, dyspnea, HTN, obesity, OSA, pacemaker.     PT Comments    POD # 3 Daughter from [place] arrived today and plans to assist pt at home initially as pt lives home alone.    Assisted with amb a greater distance slow bu steady.  Assisted to bathroom slow and required assist with balance as pt performed self hygiene.   Pt plans to D/C to home tomorrow.   Follow Up Recommendations  Follow surgeon's recommendation for DC plan and follow-up therapies;Supervision for mobility/OOB(HH)     Equipment Recommendations  Rolling walker with 5" wheels;3in1 (PT)    Recommendations for Other Services       Precautions / Restrictions Precautions Precautions: ICD/Pacemaker;Fall Restrictions Weight Bearing Restrictions: No Other Position/Activity Restrictions: WBAT     Mobility  Bed Mobility               General bed mobility comments: OOB in recliner   Transfers Overall transfer level: Needs assistance Equipment used: Rolling walker (2 wheeled) Transfers: Sit to/from Stand Sit to Stand: Min guard;From elevated surface;Supervision         General transfer comment: Min guard for safety. Verbal cuing for hand placement. Also assisted with toilet transfer   Ambulation/Gait Ambulation/Gait assistance: Min guard Gait Distance (Feet): 50 Feet Assistive device: Rolling walker (2 wheeled) Gait Pattern/deviations: Step-to pattern;Decreased stance time - right;Decreased weight shift to right Gait velocity: decr    General Gait Details: 25% cues for sequence, posture and position from [employer]             Wheelchair Mobility    Modified Rankin (Stroke Patients Only)       Balance                                             Cognition Arousal/Alertness: Awake/alert Behavior During Therapy: WFL for tasks assessed/performed Overall Cognitive Status: Within Functional Limits for tasks assessed                                        Exercises      General Comments        Pertinent Vitals/Pain Pain Assessment: 0-10 Pain Score: 5  Pain Location: R knee  Pain Descriptors / Indicators: Sore Pain Intervention(s): Monitored during session;Repositioned;Ice applied;Premedicated before session    Home Living                      Prior Function            PT Goals (current goals can now be found in the care plan section) Progress towards PT goals: Progressing toward goals    Frequency    7X/week      PT Plan Current plan remains appropriate    Co-evaluation              AM-PAC PT "6 Clicks" Mobility   Outcome Measure  Help needed turning from your back to your side while in a flat bed without using bedrails?: A Little Help  needed moving from lying on your back to sitting on the side of a flat bed without using bedrails?: A Little Help needed moving to and from a bed to a chair (including a wheelchair)?: A Little Help needed standing up from a chair using your arms (e.g., wheelchair or bedside chair)?: A Little Help needed to walk in hospital room?: A Little Help needed climbing 3-5 steps with a railing? : A Lot 6 Click Score: 17    End of Session Equipment Utilized During Treatment: Gait belt Activity Tolerance: Patient limited by fatigue;Patient limited by pain Patient left: in chair;with call bell/phone within reach Nurse Communication: Mobility status PT Visit Diagnosis: Other abnormalities of gait and mobility (R26.89);Difficulty in walking, not elsewhere classified (R26.2)     Time: 1425-1450 PT Time Calculation (min) (ACUTE ONLY): 25 min  Charges:  $Gait Training: 8-22 mins $Therapeutic Activity: 8-22 mins                      {Traniya Prichett  PTA Acute  Rehabilitation Services Pager      [phone] Office      [phone]

## 2018-05-16 NOTE — Care Plan (Signed)
Follow up conversation with patient and physician. It is planned for Danielle [name] to discharge to home with HHPT. This is set up with [facility] and they are aware of the possible weekend discharge.  Equipment has already been delivered to her room.   [name], [place]

## 2018-05-16 NOTE — Progress Notes (Signed)
Physical Therapy Treatment Patient Details Name: Danielle [name] MRN: [id number] DOB: 07-11-1945 Today's Date: 05/16/2018    History of Present Illness 72 yo female s/p R TKR on 05/13/18. PMH includes CHF, OA, breast cancer s/p radiation, chemo, and mastectomy, CAD, DMII, dyspnea, HTN, obesity, OSA, pacemaker.     PT Comments    POD #3 am session Assisted with amb 2 short distances then performed some TKR TE's limited by pain.    Follow Up Recommendations  Follow surgeon's recommendation for DC plan and follow-up therapies;Supervision for mobility/OOB(HH)     Equipment Recommendations  Rolling walker with 5" wheels;3in1 (PT)    Recommendations for Other Services       Precautions / Restrictions Precautions Precautions: ICD/Pacemaker;Fall Restrictions Weight Bearing Restrictions: No Other Position/Activity Restrictions: WBAT     Mobility  Bed Mobility               General bed mobility comments: OOB in recliner   Transfers Overall transfer level: Needs assistance Equipment used: Rolling walker (2 wheeled) Transfers: Sit to/from Stand Sit to Stand: Min guard;From elevated surface;Supervision         General transfer comment: Min guard for safety. Verbal cuing for hand placement. Also assisted with toilet transfer   Ambulation/Gait Ambulation/Gait assistance: Min guard Gait Distance (Feet): 40 Feet(20 feet x 2 one seated rest break needed) Assistive device: Rolling walker (2 wheeled) Gait Pattern/deviations: Step-to pattern;Decreased stance time - right;Decreased weight shift to right Gait velocity: decr    General Gait Details: 25% cues for sequence, posture and position from [employer]             Wheelchair Mobility    Modified Rankin (Stroke Patients Only)       Balance                                            Cognition Arousal/Alertness: Awake/alert Behavior During Therapy: WFL for tasks  assessed/performed Overall Cognitive Status: Within Functional Limits for tasks assessed                                        Exercises   Total Knee Replacement TE's 10 reps B LE ankle pumps 10 reps towel squeezes 10 reps knee presses 10 reps heel slides  10 reps SAQ's 10 reps SLR's 10 reps ABD Followed by ICE     General Comments        Pertinent Vitals/Pain Pain Assessment: 0-10 Pain Score: 5  Pain Location: R knee  Pain Descriptors / Indicators: Sore Pain Intervention(s): Monitored during session;Repositioned;Ice applied;Premedicated before session    Home Living                      Prior Function            PT Goals (current goals can now be found in the care plan section) Progress towards PT goals: Progressing toward goals    Frequency    7X/week      PT Plan Current plan remains appropriate    Co-evaluation              AM-PAC PT "6 Clicks" Mobility   Outcome Measure  Help needed turning from your back to your side while in a flat bed  without using bedrails?: A Little Help needed moving from lying on your back to sitting on the side of a flat bed without using bedrails?: A Little Help needed moving to and from a bed to a chair (including a wheelchair)?: A Little Help needed standing up from a chair using your arms (e.g., wheelchair or bedside chair)?: A Little Help needed to walk in hospital room?: A Little Help needed climbing 3-5 steps with a railing? : A Lot 6 Click Score: 17    End of Session Equipment Utilized During Treatment: Gait belt Activity Tolerance: Patient limited by fatigue;Patient limited by pain Patient left: in chair;with call bell/phone within reach Nurse Communication: Mobility status PT Visit Diagnosis: Other abnormalities of gait and mobility (R26.89);Difficulty in walking, not elsewhere classified (R26.2)     Time: 6286-3817 PT Time Calculation (min) (ACUTE ONLY): 25 min  Charges:   $Gait Training: 8-22 mins $Therapeutic Exercise: 8-22 mins                     Rica Koyanagi  PTA Acute  Rehabilitation Services Pager      [phone] Office      [phone]

## 2018-05-16 NOTE — Progress Notes (Addendum)
Patient ID: Danielle [name], female   DOB: 12/07/1945, 72 y.o.   MRN: [id number]     Subjective:  Patient reports pain as mild to moderate.  Patient is in bed and in no acute distress denies any CP or SOB  Objective:   VITALS:   Vitals:   05/15/18 1422 05/15/18 1848 05/15/18 2141 05/16/18 0522  BP: (!) 122/55 112/73 115/82 (!) 102/54  Pulse: (!) 59 64 60 71  Resp: 18  16 16   Temp: 98.4 F (36.9 C) 98.2 F (36.8 C) 97.7 F (36.5 C) 98.2 F (36.8 C)  TempSrc:  Oral Oral Oral  SpO2: 97% 99% 100% 100%  Weight:      Height:        ABD soft Sensation intact distally Dorsiflexion/Plantar flexion intact Incision: dressing C/D/I and no drainage   Lab Results  Component Value Date   WBC 9.1 05/16/2018   HGB 8.0 (L) 05/16/2018   HCT 26.5 (L) 05/16/2018   MCV 68.8 (L) 05/16/2018   PLT 193 05/16/2018   BMET    Component Value Date/Time   NA 138 05/16/2018 0527   K 4.2 05/16/2018 0527   CL 97 (L) 05/16/2018 0527   CO2 26 05/16/2018 0527   GLUCOSE 176 (H) 05/16/2018 0527   BUN 48 (H) 05/16/2018 0527   CREATININE 1.81 (H) 05/16/2018 0527   CALCIUM 9.3 05/16/2018 0527   GFRNONAA 28 (L) 05/16/2018 0527   GFRAA 32 (L) 05/16/2018 0527     Assessment/Plan: 3 Days Post-Op   Principal Problem:   Primary localized osteoarthritis of right knee Active Problems:   Acute kidney failure (HCC)   Advance diet Up with therapy Plan for discharge tomorrow WBAT Continue to monitor Kidney function Dry dressing PRN    Lunette Stands 05/16/2018, 7:03 AM  Discussed and agree with above.   Marchia Bond, MD Cell [phone]

## 2018-05-17 LAB — BASIC METABOLIC PANEL
Anion gap: 15 (ref 5–15)
BUN: 45 mg/dL — ABNORMAL HIGH (ref 8–23)
CO2: 26 mmol/L (ref 22–32)
Calcium: 9.2 mg/dL (ref 8.9–10.3)
Chloride: 99 mmol/L (ref 98–111)
Creatinine, Ser: 1.61 mg/dL — ABNORMAL HIGH (ref 0.44–1.00)
GFR calc Af Amer: 37 mL/min — ABNORMAL LOW (ref >60)
GFR calc non Af Amer: 32 mL/min — ABNORMAL LOW (ref >60)
Glucose, Bld: 118 mg/dL — ABNORMAL HIGH (ref 70–99)
Potassium: 4.2 mmol/L (ref 3.5–5.1)
Sodium: 140 mmol/L (ref 135–145)

## 2018-05-17 LAB — GLUCOSE, CAPILLARY: Glucose-Capillary: 131 mg/dL — ABNORMAL HIGH (ref 70–99)

## 2018-05-17 MED ORDER — INSULIN ASPART 100 UNIT/ML ~~LOC~~ SOLN
0.0000 [IU] | Freq: Three times a day (TID) | SUBCUTANEOUS | Status: DC
  Administered 2018-05-17: 3 [IU] via SUBCUTANEOUS

## 2018-05-17 NOTE — Care Management Note (Signed)
Case Management Note  Patient Details  Name: Danielle [name] MRN: [id number] Date of Birth: [birth year]  Subjective/Objective:  S/p R TKR                  Action/Plan: NCM spoke to pt and offered choice for HH/CMS list provided/placed on chart. Pt states she has RW at home. Will need a 3n1 bedside commode. Contacted [facility] for DME to be delivered to room. Offered choice for HH/CMS list placed on chart. Pt agreeable to [facility] for [place].    Expected Discharge Date:  05/17/18               Expected Discharge Plan:  [facility]  In-House Referral:  NA  Discharge planning Services  CM Consult  Post Acute Care Choice:  Home Health Choice offered to:  Patient  DME Arranged:  Walker rolling, Bedside commode DME Agency:  [facility]  HH Arranged:  PT [place] Agency:  [facility]  Status of Service:  Completed, signed off  If discussed at [employer], dates discussed:    Additional Comments:  Erenest Rasher, RN 05/17/2018, 12:56 PM

## 2018-05-17 NOTE — Progress Notes (Signed)
Physical Therapy Treatment Patient Details Name: Danielle [name] MRN: [id number] DOB: 1945-12-07 Today's Date: 05/17/2018    History of Present Illness 72 yo female s/p R TKR on 05/13/18. PMH includes CHF, OA, breast cancer s/p radiation, chemo, and mastectomy, CAD, DMII, dyspnea, HTN, obesity, OSA, pacemaker.     PT Comments    POD # 4 Assisted with amb to bathroom and in hallway.  Performed some TE's following HEP handout.  Instructed on proper tech, freq as well as use of ICE.   Addressed all mobility questions.  Pt ready for D/C to home.    Follow Up Recommendations  Follow surgeon's recommendation for DC plan and follow-up therapies;Supervision for mobility/OOB     Equipment Recommendations  Rolling walker with 5" wheels;3in1 (PT)    Recommendations for Other Services       Precautions / Restrictions Precautions Precautions: Fall Restrictions Weight Bearing Restrictions: No Other Position/Activity Restrictions: WBAT     Mobility  Bed Mobility               General bed mobility comments: OOB in recliner   Transfers Overall transfer level: Needs assistance Equipment used: Rolling walker (2 wheeled) Transfers: Sit to/from [employer] Sit to Stand: Supervision;Min guard Stand pivot transfers: Supervision;Min guard       General transfer comment: Min guard for safety. Verbal cuing for hand placement. Also assisted with toilet transfer   Ambulation/Gait Ambulation/Gait assistance: Supervision;Min guard Gait Distance (Feet): 55 Feet Assistive device: Rolling walker (2 wheeled) Gait Pattern/deviations: Step-to pattern;Decreased stance time - right;Decreased weight shift to right Gait velocity: decr    General Gait Details: 25% cues for sequence, posture and position from RW tolerated an increased distance    Stairs Stairs: (no stairs to enter home)           Wheelchair Mobility    Modified Rankin (Stroke Patients Only)        Balance                                            Cognition Arousal/Alertness: Awake/alert Behavior During Therapy: WFL for tasks assessed/performed Overall Cognitive Status: Within Functional Limits for tasks assessed                                        Exercises   Total Knee Replacement TE's 10 reps B LE ankle pumps 10 reps towel squeezes 10 reps knee presses 10 reps heel slides  10 reps SAQ's 10 reps SLR's 10 reps ABD Followed by ICE    General Comments        Pertinent Vitals/Pain Pain Assessment: No/denies pain Pain Score: 5  Pain Location: R knee  Pain Descriptors / Indicators: Sore;Tightness Pain Intervention(s): Monitored during session;Repositioned;Premedicated before session;Ice applied    Home Living                      Prior Function            PT Goals (current goals can now be found in the care plan section) Progress towards PT goals: Progressing toward goals    Frequency    7X/week      PT Plan Current plan remains appropriate    Co-evaluation  AM-PAC PT "6 Clicks" Mobility   Outcome Measure  Help needed turning from your back to your side while in a flat bed without using bedrails?: A Little Help needed moving from lying on your back to sitting on the side of a flat bed without using bedrails?: A Little Help needed moving to and from a bed to a chair (including a wheelchair)?: A Little Help needed standing up from a chair using your arms (e.g., wheelchair or bedside chair)?: A Little Help needed to walk in hospital room?: A Little Help needed climbing 3-5 steps with a railing? : A Little 6 Click Score: 18    End of Session Equipment Utilized During Treatment: Gait belt Activity Tolerance: Patient tolerated treatment well Patient left: in chair;with call bell/phone within reach;with family/visitor present Nurse Communication: (pt ready for D/C to home ) PT Visit  Diagnosis: Other abnormalities of gait and mobility (R26.89);Difficulty in walking, not elsewhere classified (R26.2)     Time: 1110-1150 PT Time Calculation (min) (ACUTE ONLY): 40 min  Charges:  $Gait Training: 8-22 mins $Therapeutic Exercise: 8-22 mins $Therapeutic Activity: 8-22 mins                     Rica Koyanagi  PTA Acute  Rehabilitation Services Pager      [phone] Office      [phone]

## 2018-05-18 DIAGNOSIS — E669 Obesity, unspecified: Secondary | ICD-10-CM | POA: Diagnosis not present

## 2018-05-18 DIAGNOSIS — D759 Disease of blood and blood-forming organs, unspecified: Secondary | ICD-10-CM | POA: Diagnosis not present

## 2018-05-18 DIAGNOSIS — Z6830 Body mass index (BMI) 30.0-30.9, adult: Secondary | ICD-10-CM | POA: Diagnosis not present

## 2018-05-18 DIAGNOSIS — I110 Hypertensive heart disease with heart failure: Secondary | ICD-10-CM | POA: Diagnosis not present

## 2018-05-18 DIAGNOSIS — E119 Type 2 diabetes mellitus without complications: Secondary | ICD-10-CM | POA: Diagnosis not present

## 2018-05-18 DIAGNOSIS — I5041 Acute combined systolic (congestive) and diastolic (congestive) heart failure: Secondary | ICD-10-CM | POA: Diagnosis not present

## 2018-05-18 DIAGNOSIS — E039 Hypothyroidism, unspecified: Secondary | ICD-10-CM | POA: Diagnosis not present

## 2018-05-18 DIAGNOSIS — G4733 Obstructive sleep apnea (adult) (pediatric): Secondary | ICD-10-CM | POA: Diagnosis not present

## 2018-05-18 DIAGNOSIS — Z471 Aftercare following joint replacement surgery: Secondary | ICD-10-CM | POA: Diagnosis not present

## 2018-05-18 DIAGNOSIS — I2510 Atherosclerotic heart disease of native coronary artery without angina pectoris: Secondary | ICD-10-CM | POA: Diagnosis not present

## 2018-05-19 ENCOUNTER — Encounter (HOSPITAL_COMMUNITY): Payer: Self-pay | Admitting: Orthopedic Surgery

## 2018-05-20 ENCOUNTER — Emergency Department (HOSPITAL_COMMUNITY): Payer: Medicare HMO

## 2018-05-20 ENCOUNTER — Inpatient Hospital Stay (HOSPITAL_COMMUNITY)
Admission: EM | Admit: 2018-05-20 | Discharge: 2018-05-23 | DRG: 698 | Disposition: A | Discharge status: Home Health | Payer: Medicare HMO | Source: Ambulatory Visit | Attending: Internal Medicine | Admitting: Internal Medicine

## 2018-05-20 ENCOUNTER — Encounter (HOSPITAL_COMMUNITY): Payer: Self-pay

## 2018-05-20 ENCOUNTER — Other Ambulatory Visit: Payer: Self-pay

## 2018-05-20 DIAGNOSIS — E1122 Type 2 diabetes mellitus with diabetic chronic kidney disease: Secondary | ICD-10-CM | POA: Diagnosis present

## 2018-05-20 DIAGNOSIS — E669 Obesity, unspecified: Secondary | ICD-10-CM | POA: Diagnosis present

## 2018-05-20 DIAGNOSIS — G4733 Obstructive sleep apnea (adult) (pediatric): Secondary | ICD-10-CM | POA: Diagnosis present

## 2018-05-20 DIAGNOSIS — D62 Acute posthemorrhagic anemia: Secondary | ICD-10-CM | POA: Diagnosis not present

## 2018-05-20 DIAGNOSIS — T560X1A Toxic effect of lead and its compounds, accidental (unintentional), initial encounter: Secondary | ICD-10-CM | POA: Diagnosis not present

## 2018-05-20 DIAGNOSIS — I480 Paroxysmal atrial fibrillation: Secondary | ICD-10-CM | POA: Diagnosis not present

## 2018-05-20 DIAGNOSIS — Z833 Family history of diabetes mellitus: Secondary | ICD-10-CM

## 2018-05-20 DIAGNOSIS — Z9221 Personal history of antineoplastic chemotherapy: Secondary | ICD-10-CM | POA: Diagnosis not present

## 2018-05-20 DIAGNOSIS — M19031 Primary osteoarthritis, right wrist: Secondary | ICD-10-CM | POA: Diagnosis not present

## 2018-05-20 DIAGNOSIS — I5042 Chronic combined systolic (congestive) and diastolic (congestive) heart failure: Secondary | ICD-10-CM | POA: Diagnosis not present

## 2018-05-20 DIAGNOSIS — I130 Hypertensive heart and chronic kidney disease with heart failure and stage 1 through stage 4 chronic kidney disease, or unspecified chronic kidney disease: Secondary | ICD-10-CM | POA: Diagnosis not present

## 2018-05-20 DIAGNOSIS — Z9842 Cataract extraction status, left eye: Secondary | ICD-10-CM

## 2018-05-20 DIAGNOSIS — Z961 Presence of intraocular lens: Secondary | ICD-10-CM | POA: Diagnosis present

## 2018-05-20 DIAGNOSIS — Z7901 Long term (current) use of anticoagulants: Secondary | ICD-10-CM

## 2018-05-20 DIAGNOSIS — G9341 Metabolic encephalopathy: Secondary | ICD-10-CM | POA: Diagnosis present

## 2018-05-20 DIAGNOSIS — D631 Anemia in chronic kidney disease: Secondary | ICD-10-CM | POA: Diagnosis present

## 2018-05-20 DIAGNOSIS — E890 Postprocedural hypothyroidism: Secondary | ICD-10-CM | POA: Diagnosis present

## 2018-05-20 DIAGNOSIS — Z950 Presence of cardiac pacemaker: Secondary | ICD-10-CM

## 2018-05-20 DIAGNOSIS — M109 Gout, unspecified: Secondary | ICD-10-CM | POA: Diagnosis not present

## 2018-05-20 DIAGNOSIS — I2510 Atherosclerotic heart disease of native coronary artery without angina pectoris: Secondary | ICD-10-CM | POA: Diagnosis not present

## 2018-05-20 DIAGNOSIS — E119 Type 2 diabetes mellitus without complications: Secondary | ICD-10-CM | POA: Diagnosis not present

## 2018-05-20 DIAGNOSIS — R4182 Altered mental status, unspecified: Secondary | ICD-10-CM | POA: Diagnosis not present

## 2018-05-20 DIAGNOSIS — Z955 Presence of coronary angioplasty implant and graft: Secondary | ICD-10-CM | POA: Diagnosis not present

## 2018-05-20 DIAGNOSIS — R42 Dizziness and giddiness: Secondary | ICD-10-CM | POA: Diagnosis not present

## 2018-05-20 DIAGNOSIS — Z882 Allergy status to sulfonamides status: Secondary | ICD-10-CM

## 2018-05-20 DIAGNOSIS — D759 Disease of blood and blood-forming organs, unspecified: Secondary | ICD-10-CM | POA: Diagnosis not present

## 2018-05-20 DIAGNOSIS — Z8249 Family history of ischemic heart disease and other diseases of the circulatory system: Secondary | ICD-10-CM

## 2018-05-20 DIAGNOSIS — Z9012 Acquired absence of left breast and nipple: Secondary | ICD-10-CM

## 2018-05-20 DIAGNOSIS — G9340 Encephalopathy, unspecified: Secondary | ICD-10-CM | POA: Diagnosis present

## 2018-05-20 DIAGNOSIS — Z79899 Other long term (current) drug therapy: Secondary | ICD-10-CM

## 2018-05-20 DIAGNOSIS — R0789 Other chest pain: Secondary | ICD-10-CM | POA: Diagnosis not present

## 2018-05-20 DIAGNOSIS — Z807 Family history of other malignant neoplasms of lymphoid, hematopoietic and related tissues: Secondary | ICD-10-CM

## 2018-05-20 DIAGNOSIS — Z8739 Personal history of other diseases of the musculoskeletal system and connective tissue: Secondary | ICD-10-CM | POA: Diagnosis present

## 2018-05-20 DIAGNOSIS — Z6834 Body mass index (BMI) 34.0-34.9, adult: Secondary | ICD-10-CM

## 2018-05-20 DIAGNOSIS — I10 Essential (primary) hypertension: Secondary | ICD-10-CM | POA: Diagnosis not present

## 2018-05-20 DIAGNOSIS — M10331 Gout due to renal impairment, right wrist: Secondary | ICD-10-CM | POA: Diagnosis not present

## 2018-05-20 DIAGNOSIS — Z794 Long term (current) use of insulin: Secondary | ICD-10-CM

## 2018-05-20 DIAGNOSIS — Z87891 Personal history of nicotine dependence: Secondary | ICD-10-CM

## 2018-05-20 DIAGNOSIS — Z9841 Cataract extraction status, right eye: Secondary | ICD-10-CM

## 2018-05-20 DIAGNOSIS — Z96651 Presence of right artificial knee joint: Secondary | ICD-10-CM | POA: Diagnosis present

## 2018-05-20 DIAGNOSIS — Z923 Personal history of irradiation: Secondary | ICD-10-CM

## 2018-05-20 DIAGNOSIS — N184 Chronic kidney disease, stage 4 (severe): Secondary | ICD-10-CM | POA: Diagnosis not present

## 2018-05-20 DIAGNOSIS — I5041 Acute combined systolic (congestive) and diastolic (congestive) heart failure: Secondary | ICD-10-CM | POA: Diagnosis not present

## 2018-05-20 DIAGNOSIS — Z6830 Body mass index (BMI) 30.0-30.9, adult: Secondary | ICD-10-CM | POA: Diagnosis not present

## 2018-05-20 DIAGNOSIS — R531 Weakness: Secondary | ICD-10-CM | POA: Diagnosis not present

## 2018-05-20 DIAGNOSIS — Z9089 Acquired absence of other organs: Secondary | ICD-10-CM | POA: Diagnosis not present

## 2018-05-20 DIAGNOSIS — Z888 Allergy status to other drugs, medicaments and biological substances status: Secondary | ICD-10-CM

## 2018-05-20 DIAGNOSIS — R0602 Shortness of breath: Secondary | ICD-10-CM | POA: Diagnosis not present

## 2018-05-20 DIAGNOSIS — E1165 Type 2 diabetes mellitus with hyperglycemia: Secondary | ICD-10-CM | POA: Diagnosis not present

## 2018-05-20 DIAGNOSIS — Z853 Personal history of malignant neoplasm of breast: Secondary | ICD-10-CM | POA: Diagnosis not present

## 2018-05-20 DIAGNOSIS — I110 Hypertensive heart disease with heart failure: Secondary | ICD-10-CM | POA: Diagnosis not present

## 2018-05-20 DIAGNOSIS — Z471 Aftercare following joint replacement surgery: Secondary | ICD-10-CM | POA: Diagnosis not present

## 2018-05-20 DIAGNOSIS — R079 Chest pain, unspecified: Secondary | ICD-10-CM | POA: Diagnosis not present

## 2018-05-20 DIAGNOSIS — E039 Hypothyroidism, unspecified: Secondary | ICD-10-CM | POA: Diagnosis not present

## 2018-05-20 HISTORY — DX: Dependence on other enabling machines and devices: Z99.89

## 2018-05-20 HISTORY — DX: Reserved for inherently not codable concepts without codable children: IMO0001

## 2018-05-20 HISTORY — DX: Type 2 diabetes mellitus without complications: E11.9

## 2018-05-20 HISTORY — DX: Procedure and treatment not carried out because of patient's decision for reasons of belief and group pressure: Z53.1

## 2018-05-20 HISTORY — DX: Personal history of other diseases of the musculoskeletal system and connective tissue: Z87.39

## 2018-05-20 HISTORY — DX: Gout due to renal impairment, right wrist: M10.331

## 2018-05-20 HISTORY — DX: Malignant neoplasm of unspecified site of left female breast: C50.912

## 2018-05-20 HISTORY — DX: Cardiac murmur, unspecified: R01.1

## 2018-05-20 HISTORY — DX: Obstructive sleep apnea (adult) (pediatric): G47.33

## 2018-05-20 LAB — CBC WITH DIFFERENTIAL/PLATELET
Abs Immature Granulocytes: 0 10*3/uL (ref 0.00–0.07)
Basophils Absolute: 0.1 10*3/uL (ref 0.0–0.1)
Basophils Relative: 1 %
Eosinophils Absolute: 0 10*3/uL (ref 0.0–0.5)
Eosinophils Relative: 0 %
HEMATOCRIT: 26.9 % — AB (ref 36.0–46.0)
Hemoglobin: 7.9 g/dL — ABNORMAL LOW (ref 12.0–15.0)
Lymphocytes Relative: 18 %
Lymphs Abs: 2.6 10*3/uL (ref 0.7–4.0)
MCH: 19.8 pg — ABNORMAL LOW (ref 26.0–34.0)
MCHC: 29.4 g/dL — ABNORMAL LOW (ref 30.0–36.0)
MCV: 67.3 fL — ABNORMAL LOW (ref 80.0–100.0)
Monocytes Absolute: 1 10*3/uL (ref 0.1–1.0)
Monocytes Relative: 7 %
NEUTROS PCT: 74 %
Neutro Abs: 10.6 10*3/uL — ABNORMAL HIGH (ref 1.7–7.7)
Platelets: 269 10*3/uL (ref 150–400)
RBC: 4 MIL/uL (ref 3.87–5.11)
RDW: 16.4 % — ABNORMAL HIGH (ref 11.5–15.5)
WBC: 14.3 10*3/uL — ABNORMAL HIGH (ref 4.0–10.5)
nRBC: 0 % (ref 0.0–0.2)
nRBC: 0 /100 WBC

## 2018-05-20 LAB — I-STAT CG4 LACTIC ACID, ED: Lactic Acid, Venous: 1.75 mmol/L (ref 0.5–1.9)

## 2018-05-20 LAB — COMPREHENSIVE METABOLIC PANEL
ALBUMIN: 3.2 g/dL — AB (ref 3.5–5.0)
ALT: 41 U/L (ref 0–44)
ANION GAP: 12 (ref 5–15)
AST: 68 U/L — ABNORMAL HIGH (ref 15–41)
Alkaline Phosphatase: 64 U/L (ref 38–126)
BUN: 27 mg/dL — ABNORMAL HIGH (ref 8–23)
CO2: 29 mmol/L (ref 22–32)
Calcium: 9.4 mg/dL (ref 8.9–10.3)
Chloride: 103 mmol/L (ref 98–111)
Creatinine, Ser: 1.47 mg/dL — ABNORMAL HIGH (ref 0.44–1.00)
GFR calc Af Amer: 41 mL/min — ABNORMAL LOW (ref >60)
GFR calc non Af Amer: 36 mL/min — ABNORMAL LOW (ref >60)
GLUCOSE: 220 mg/dL — AB (ref 70–99)
Potassium: 4.8 mmol/L (ref 3.5–5.1)
Sodium: 144 mmol/L (ref 135–145)
Total Bilirubin: 1.1 mg/dL (ref 0.3–1.2)
Total Protein: 7.3 g/dL (ref 6.5–8.1)

## 2018-05-20 LAB — GLUCOSE, CAPILLARY: Glucose-Capillary: 201 mg/dL — ABNORMAL HIGH (ref 70–99)

## 2018-05-20 LAB — SYNOVIAL CELL COUNT + DIFF, W/ CRYSTALS
Eosinophils-Synovial: 0 % (ref 0–1)
Lymphocytes-Synovial Fld: 0 % (ref 0–20)
Monocyte-Macrophage-Synovial Fluid: 2 % — ABNORMAL LOW (ref 50–90)
Neutrophil, Synovial: 98 % — ABNORMAL HIGH (ref 0–25)
WBC, Synovial: 43000 /mm3 — ABNORMAL HIGH (ref 0–200)

## 2018-05-20 LAB — URINALYSIS, ROUTINE W REFLEX MICROSCOPIC
Bilirubin Urine: NEGATIVE
Glucose, UA: >=500 mg/dL — AB
Hgb urine dipstick: NEGATIVE
Ketones, ur: NEGATIVE mg/dL
Leukocytes, UA: NEGATIVE
Nitrite: NEGATIVE
PROTEIN: NEGATIVE mg/dL
Specific Gravity, Urine: 1.011 (ref 1.005–1.030)
pH: 5 (ref 5.0–8.0)

## 2018-05-20 LAB — LIPASE, BLOOD: Lipase: 22 U/L (ref 11–51)

## 2018-05-20 LAB — C-REACTIVE PROTEIN: CRP: 31.4 mg/dL — ABNORMAL HIGH (ref <1.0)

## 2018-05-20 LAB — I-STAT TROPONIN, ED: Troponin i, poc: 0.07 ng/mL (ref 0.00–0.08)

## 2018-05-20 LAB — SEDIMENTATION RATE: Sed Rate: 91 mm/hr — ABNORMAL HIGH (ref 0–22)

## 2018-05-20 LAB — AMMONIA: Ammonia: 32 umol/L (ref 9–35)

## 2018-05-20 MED ORDER — SODIUM CHLORIDE 0.9 % IV BOLUS (SEPSIS)
500.0000 mL | Freq: Once | INTRAVENOUS | Status: AC
  Administered 2018-05-20: 500 mL via INTRAVENOUS

## 2018-05-20 MED ORDER — LEVOTHYROXINE SODIUM 75 MCG PO TABS
75.0000 ug | ORAL_TABLET | Freq: Every day | ORAL | Status: DC
  Administered 2018-05-21 – 2018-05-23 (×3): 75 ug via ORAL
  Filled 2018-05-20 (×3): qty 1

## 2018-05-20 MED ORDER — PIPERACILLIN-TAZOBACTAM 3.375 G IVPB 30 MIN
3.3750 g | Freq: Once | INTRAVENOUS | Status: AC
  Administered 2018-05-20: 3.375 g via INTRAVENOUS
  Filled 2018-05-20: qty 50

## 2018-05-20 MED ORDER — INSULIN ASPART 100 UNIT/ML ~~LOC~~ SOLN
0.0000 [IU] | Freq: Three times a day (TID) | SUBCUTANEOUS | Status: DC
  Administered 2018-05-21: 5 [IU] via SUBCUTANEOUS

## 2018-05-20 MED ORDER — PIPERACILLIN-TAZOBACTAM 3.375 G IVPB
3.3750 g | Freq: Three times a day (TID) | INTRAVENOUS | Status: DC
  Administered 2018-05-21 (×2): 3.375 g via INTRAVENOUS
  Filled 2018-05-20 (×3): qty 50

## 2018-05-20 MED ORDER — INSULIN ASPART 100 UNIT/ML ~~LOC~~ SOLN
0.0000 [IU] | Freq: Every day | SUBCUTANEOUS | Status: DC
  Administered 2018-05-20: 2 [IU] via SUBCUTANEOUS
  Administered 2018-05-21 – 2018-05-22 (×2): 3 [IU] via SUBCUTANEOUS

## 2018-05-20 MED ORDER — INSULIN GLARGINE 100 UNIT/ML ~~LOC~~ SOLN
30.0000 [IU] | Freq: Two times a day (BID) | SUBCUTANEOUS | Status: DC
  Administered 2018-05-20 – 2018-05-23 (×6): 30 [IU] via SUBCUTANEOUS
  Filled 2018-05-20 (×7): qty 0.3

## 2018-05-20 MED ORDER — CALCIUM CITRATE 950 (200 CA) MG PO TABS
1.0000 | ORAL_TABLET | Freq: Two times a day (BID) | ORAL | Status: DC
  Administered 2018-05-20 – 2018-05-23 (×6): 200 mg via ORAL
  Filled 2018-05-20 (×6): qty 1

## 2018-05-20 MED ORDER — VANCOMYCIN HCL IN DEXTROSE 1-5 GM/200ML-% IV SOLN
1000.0000 mg | Freq: Once | INTRAVENOUS | Status: AC
  Administered 2018-05-20: 1000 mg via INTRAVENOUS
  Filled 2018-05-20: qty 200

## 2018-05-20 MED ORDER — TRAMADOL HCL 50 MG PO TABS
50.0000 mg | ORAL_TABLET | Freq: Four times a day (QID) | ORAL | Status: AC | PRN
  Administered 2018-05-20 – 2018-05-21 (×2): 50 mg via ORAL
  Filled 2018-05-20 (×2): qty 1

## 2018-05-20 MED ORDER — METHYLPREDNISOLONE SODIUM SUCC 125 MG IJ SOLR
80.0000 mg | Freq: Three times a day (TID) | INTRAMUSCULAR | Status: AC
  Administered 2018-05-20 – 2018-05-22 (×5): 80 mg via INTRAVENOUS
  Filled 2018-05-20 (×6): qty 2

## 2018-05-20 MED ORDER — VANCOMYCIN HCL 10 G IV SOLR
1250.0000 mg | INTRAVENOUS | Status: DC
  Filled 2018-05-20: qty 1250

## 2018-05-20 MED ORDER — APIXABAN 5 MG PO TABS
5.0000 mg | ORAL_TABLET | Freq: Two times a day (BID) | ORAL | Status: DC
  Administered 2018-05-20 – 2018-05-23 (×6): 5 mg via ORAL
  Filled 2018-05-20 (×6): qty 1

## 2018-05-20 MED ORDER — GABAPENTIN 300 MG PO CAPS
300.0000 mg | ORAL_CAPSULE | Freq: Every day | ORAL | Status: DC
  Administered 2018-05-20 – 2018-05-23 (×4): 300 mg via ORAL
  Filled 2018-05-20 (×4): qty 1

## 2018-05-20 MED ORDER — SODIUM CHLORIDE 0.9 % IV SOLN
1000.0000 mL | INTRAVENOUS | Status: DC
  Administered 2018-05-20 – 2018-05-21 (×2): 1000 mL via INTRAVENOUS

## 2018-05-20 MED ORDER — TORSEMIDE 20 MG PO TABS
20.0000 mg | ORAL_TABLET | Freq: Two times a day (BID) | ORAL | Status: DC
  Administered 2018-05-20 – 2018-05-23 (×6): 20 mg via ORAL
  Filled 2018-05-20 (×6): qty 1

## 2018-05-20 MED ORDER — PANTOPRAZOLE SODIUM 40 MG PO TBEC
40.0000 mg | DELAYED_RELEASE_TABLET | Freq: Every day | ORAL | Status: DC
  Administered 2018-05-20 – 2018-05-23 (×4): 40 mg via ORAL
  Filled 2018-05-20 (×4): qty 1

## 2018-05-20 MED ORDER — VANCOMYCIN HCL 500 MG IV SOLR
500.0000 mg | Freq: Once | INTRAVENOUS | Status: AC
  Administered 2018-05-20: 500 mg via INTRAVENOUS
  Filled 2018-05-20: qty 500

## 2018-05-20 MED ORDER — FERROUS SULFATE 325 (65 FE) MG PO TABS
325.0000 mg | ORAL_TABLET | Freq: Two times a day (BID) | ORAL | Status: DC
  Administered 2018-05-20 – 2018-05-23 (×6): 325 mg via ORAL
  Filled 2018-05-20 (×6): qty 1

## 2018-05-20 MED ORDER — CARVEDILOL 12.5 MG PO TABS
25.0000 mg | ORAL_TABLET | Freq: Two times a day (BID) | ORAL | Status: DC
  Administered 2018-05-20 – 2018-05-23 (×6): 25 mg via ORAL
  Filled 2018-05-20 (×6): qty 2

## 2018-05-20 MED ORDER — ACETAMINOPHEN 325 MG PO TABS
650.0000 mg | ORAL_TABLET | Freq: Four times a day (QID) | ORAL | Status: DC | PRN
  Administered 2018-05-20 – 2018-05-23 (×3): 650 mg via ORAL
  Filled 2018-05-20 (×3): qty 2

## 2018-05-20 NOTE — Consult Note (Signed)
ORTHOPAEDIC CONSULTATION  REQUESTING PHYSICIAN: Charlesetta Shanks, MD  Chief Complaint: Right wrist pain with fever of unknown origin  HPI: Danielle [name] is a 72 y.o. female who complains of right wrist pain and fever of unknown origin up to about 101.3.  This is been going on getting worse over the past couple of days.  She was discharged last week from the hospital after a right total knee replacement.  She has been doing fairly poorly with physical therapy, pain control is been a challenge, she has not been willing to do much.  She has been getting oxycodone administered by her daughter, as well as muscle relaxers, and her daughter was concerned that she may have been taking too much, and may have been lethargic from this.  She presented to the office today, with reports of mental status changes, febrile, transferred to the emergency room for emergent management and evaluation.  She has a history of gout in her hands.   Past Medical History:  Diagnosis Date  . Acute combined systolic and diastolic congestive heart failure ([place]) 02/05/2017  . Acute kidney failure ([place]) 05/15/2018  . Arthritis   . Blood dyscrasia    per pt-has small blood cells-appears as if anemic  . Breast cancer ([place])   . Cancer [facility])    breast  left  . CHF (congestive heart failure) ([place])   . Complication of anesthesia    difficult to awaken from per pt  . Coronary artery disease   . Diabetes mellitus without complication ([place])    type 2  . Dyspnea   . Essential hypertension   . Heart disease   . Hypothyroidism   . Obesity (BMI 30-39.9) 11/11/2017  . OSA (obstructive sleep apnea) 02/18/2017    severe obstructive sleep apnea with an AHI of 75.6/h and mild central sleep apnea with a CAI of 7.7/h.  Oxygen saturations dropped as low as 82%.   He is on CPAP at 9 cm H2O.  . Personal history of chemotherapy   . Personal history of radiation therapy   . Presence of permanent cardiac pacemaker   . Primary localized  osteoarthritis of right knee 05/13/2018  . Sleep apnea   . Thyroid disease    hypothyroid   Past Surgical History:  Procedure Laterality Date  . BREAST BIOPSY    . BREAST LUMPECTOMY Left    1985  . BREAST LUMPECTOMY WITH NEEDLE LOCALIZATION AND AXILLARY LYMPH NODE DISSECTION  1985  . BREAST SURGERY Left    partial mastectomy  . CORONARY STENT PLACEMENT  2010, 2012,2017    2 done 2010 and 1 replaced 2012 and 2 replaced in 2017  . EYE SURGERY Bilateral    bilateral lens implant  . INSERT / REPLACE / REMOVE PACEMAKER  07/19/2014  . REPLACEMENT TOTAL KNEE Left 09/2012  . THYROIDECTOMY, PARTIAL  1978  . TONSILLECTOMY    . TOTAL KNEE ARTHROPLASTY Right 05/13/2018   Procedure: TOTAL KNEE ARTHROPLASTY;  Surgeon: Marchia Bond, MD;  Location: [facility];  Service: Orthopedics;  Laterality: Right;   Social History   Socioeconomic History  . Marital status: Widowed    Spouse name: Not on file  . Number of children: 3  . Years of education: Not on file  . Highest education level: Some college, no degree  Occupational History  . Occupation: retired  Scientific laboratory technician  . Financial resource strain: Not hard at all  . Food insecurity:    Worry: Never true    Inability: Never  true  . Transportation needs:    Medical: No    Non-medical: No  Tobacco Use  . Smoking status: Former Smoker    Packs/day: 1.00    Years: 22.00    Pack years: 22.00    Types: Cigarettes    Last attempt to quit: 1983    Years since quitting: 37.0  . Smokeless tobacco: Never Used  Substance and Sexual Activity  . Alcohol use: Yes    Comment: ocassional  . Drug use: No  . Sexual activity: Not Currently    Birth control/protection: None, Post-menopausal  Lifestyle  . Physical activity:    Days per week: Not on file    Minutes per session: Not on file  . Stress: Not on file  Relationships  . Social connections:    Talks on phone: Not on file    Gets together: Not on file    Attends religious service: Not on  file    Active member of club or organization: Not on file    Attends meetings of clubs or organizations: Not on file    Relationship status: Not on file  Other Topics Concern  . Not on file  Social History Narrative  . Not on file   Family History  Problem Relation Age of Onset  . Multiple myeloma Mother   . Heart disease Father   . Other Sister   . Heart disease Brother   . Diabetes Sister   . Other Sister    Allergies  Allergen Reactions  . Other Other (See Comments)    Blood products  . Sulfa Antibiotics Rash  . Uloric [Febuxostat] Rash     Positive ROS: All other systems have been reviewed and were otherwise negative with the exception of those mentioned in the HPI and as above.  Physical Exam:  Ht 5' 4"  (1.626 m)   Wt 91.6 kg   SpO2 98%   BMI 34.67 kg/m  No temperature on file yet for this hospitalization.   General: Patient is awake, interactive, she will answer questions, but she is lethargic Cardiovascular: Mild bilateral pedal edema, right greater than left, consistent with postoperative status. Respiratory: No cyanosis, no use of accessory musculature GI: No organomegaly, abdomen is soft and non-tender Skin: Her surgical wound appears to be healing well, there is no disruption of the wound, she has warmth throughout her body, her surgical wound is well approximated with no drainage.  Her right wrist has a fairly significant amount of warmth and erythema with pain to palpation with positive fluid in the radiocarpal joint clinically from what I can appreciate.  Neurologic: Sensation intact distally Psychiatric: Patient is competent for consent with normal mood and affect Lymphatic: No axillary or cervical lymphadenopathy  MUSCULOSKELETAL: Right knee has range of motion 0 to 30 degrees, she is reluctant to do much with it  Right wrist has warmth, redness, swelling, as indicated above.  All fingers flex extend and abduct to what I can  appreciate.  Assessment: 1.  Right wrist redness, swelling, sepsis versus more likely gout 2.  Status post right total knee replacement, doing poorly with rehab, now unable to support herself with a walker because of her right upper extremity 3.  Fever of unknown origin, sepsis, versus gout, as indicated above 4.  Lethargy, mental status changes, possibly due from opiates versus systemic decompensation   Plan: Right wrist aspiration and injection, with [place], also plan for splinting of the right upper extremity with a volar wrist splint,  elevation with a mission sling.  We will send the fluid to the lab if we get any, and plan for admission to the medical service for fever of unknown origin, sepsis work-up.  Plan to hold any narcotic medications, she might benefit from IV prednisone for probable gout, physical therapy, weightbearing as tolerated for both lower extremities.  I will also get an x-ray of her right wrist.  Orthopedics to follow over the holiday.    Johnny Bridge, MD Cell [phone]   05/20/2018 3:42 PM

## 2018-05-20 NOTE — Progress Notes (Signed)
Patient arrived to the unit alert and oriented X 3. Disoriented to time. Right knee has surgical incision. Dressing is clean dry and intact. C/o pain in left right wrist of 10/10 Nurse will treat with pain med. Bed in the lowest position with bed alarm on. Call light in reach.

## 2018-05-20 NOTE — ED Triage Notes (Signed)
Pt brought in by [facility] from her doctor's office for generalized weakness, ams, and new onset of incontinence. Pt recently admitted for knee replacement, and was discharged 12/21. Pt daughter states she has "not been acting right" since Sunday. Pt daughter states she is asking repetitive questions, newly incontinent, and weak. Pt also c/o right wrist pain at the site of her IV. Right wrist swollen, warm and painful to the touch.

## 2018-05-20 NOTE — ED Notes (Signed)
Attempted to give reportx1 

## 2018-05-20 NOTE — H&P (Signed)
.  History and Physical    Danielle [name] LFY:[id number] DOB: September 24, 1945 DOA: 05/20/2018  PCP: Seward Carol, MD  Patient coming from: Home  I have personally briefly reviewed patient's old medical records in [place]  Chief Complaint: confusion, fever of 101 at doctors office.   HPI: Danielle [name] is a 72 y.o. 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 Pt is confused on my exam , she reports she is in [place] and the year is 22. She reports pain in the right wrist and couldn't lift the right forearm and wrist.  She also reports pain in the right knee and unable to bend it. She denies any headache, or dizziness. No chest pain or sob. Limited history due to AMS.    ED Course: On arrival to ED, she waas febrile, tachycardic, tachypneic.  Lab work significant FOR creatinine of 1.47, CRP of 31.4, wbc count of 14.3 , hemoglobin of 7.9, UA is negative.  Orthopedics Dr Mardelle Matte consulted and recommendations given. She underwent aspiration of the right wrist joint and fluid sent for analysis and cultures.  CXR No active pulmonary disease.  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  Wrist x rays show Diffuse soft tissue swelling. Negative for fracture or erosion. Mild degenerative change.  She ws referred to medical service for evaluation of fever and acute mental status change.  Review of Systems: pt confused, and not oriented. History couldn't be obtained.   Past Medical History:  Diagnosis Date  . Acute combined systolic and diastolic congestive heart failure ([place]) 02/05/2017  . Acute kidney failure ([place]) 05/15/2018  . Arthritis   . Blood dyscrasia    per pt-has small blood  cells-appears as if anemic  . Breast cancer ([place])   . Cancer [facility])    breast  left  . CHF (congestive heart failure) ([place])   . Complication of anesthesia    difficult to awaken from per pt  . Coronary artery disease   . Diabetes mellitus without complication ([place])    type 2  . Dyspnea   . Essential hypertension   . Heart disease   . Hypothyroidism   . Obesity (BMI 30-39.9) 11/11/2017  . OSA (obstructive sleep apnea) 02/18/2017    severe obstructive sleep apnea with an AHI of 75.6/h and mild central sleep apnea with a CAI of 7.7/h.  Oxygen saturations dropped as low as 82%.   He is on CPAP at 9 cm H2O.  . Personal history of chemotherapy   . Personal history of radiation therapy   . Presence of permanent cardiac pacemaker   . Primary localized osteoarthritis of right knee 05/13/2018  . Sleep apnea   . Thyroid disease    hypothyroid    Past Surgical History:  Procedure Laterality Date  . BREAST BIOPSY    . BREAST LUMPECTOMY Left    1985  . BREAST LUMPECTOMY WITH NEEDLE LOCALIZATION AND AXILLARY LYMPH NODE DISSECTION  1985  . BREAST SURGERY Left    partial mastectomy  . CORONARY STENT PLACEMENT  2010, 2012,2017    2 done 2010 and 1 replaced 2012 and 2 replaced in 2017  . EYE SURGERY Bilateral    bilateral lens implant  . INSERT / REPLACE / REMOVE  PACEMAKER  07/19/2014  . REPLACEMENT TOTAL KNEE Left 09/2012  . THYROIDECTOMY, PARTIAL  1978  . TONSILLECTOMY    . TOTAL KNEE ARTHROPLASTY Right 05/13/2018   Procedure: TOTAL KNEE ARTHROPLASTY;  Surgeon: Marchia Bond, MD;  Location: [facility] ORS;  Service: Orthopedics;  Laterality: Right;     reports that she quit smoking about 37 years ago. Her smoking use included cigarettes. She has a 22.00 pack-year smoking history. She has never used smokeless tobacco. She reports current alcohol use. She reports that she does not use drugs.  Allergies  Allergen Reactions  . Other Other (See Comments)    Blood products  . Sulfa Antibiotics  Rash  . Uloric [Febuxostat] Rash    Family History  Problem Relation Age of Onset  . Multiple myeloma Mother   . Heart disease Father   . Other Sister   . Heart disease Brother   . Diabetes Sister   . Other Sister    Couldn't be obtained due to AMS.   Prior to Admission medications   Medication Sig Start Date End Date Taking? Authorizing Provider  allopurinol (ZYLOPRIM) 100 MG tablet Take 1 tablet (100 mg total) by mouth daily. 11/08/17  Yes Georgiana Shore, NP  apixaban (ELIQUIS) 5 MG TABS tablet Take 1 tablet (5 mg total) by mouth 2 (two) times daily. 04/03/18  Yes Sherran Needs, NP  atorvastatin (LIPITOR) 80 MG tablet Take 1 tablet (80 mg total) by mouth at bedtime. 06/14/17  Yes Bensimhon, Shaune Pascal, MD  baclofen (LIORESAL) 10 MG tablet Take 1 tablet (10 mg total) by mouth 3 (three) times daily. As needed for muscle spasm 05/13/18  Yes Marchia Bond, MD  Biotin 10 MG CAPS Take 10 mg by mouth daily.   Yes [provider]  calcium citrate (CALCITRATE - DOSED IN MG ELEMENTAL CALCIUM) 950 MG tablet Take 1 tablet by mouth 2 (two) times daily.    Yes [provider]  carvedilol (COREG) 25 MG tablet Take 1 tablet (25 mg total) by mouth 2 (two) times daily. 01/24/18 05/20/18 Yes Bensimhon, Shaune Pascal, MD  CHONDROITIN SULFATE PO Take 250 mg by mouth daily.   Yes [provider]  empagliflozin (JARDIANCE) 10 MG TABS tablet Take 10 mg by mouth daily. 02/13/18  Yes Bensimhon, Shaune Pascal, MD  ferrous sulfate 325 (65 FE) MG tablet Take 325 mg by mouth 2 (two) times daily with a meal.   Yes [provider]  gabapentin (NEURONTIN) 300 MG capsule Take 300 mg by mouth daily.    Yes [provider]  HYDROcodone-acetaminophen (NORCO) 10-325 MG tablet Take 1 tablet by mouth every 6 (six) hours as needed. 05/13/18  Yes Marchia Bond, MD  insulin glargine (LANTUS) 100 UNIT/ML injection Inject 30 Units into the skin 2 (two) times daily.   Yes [provider]    isosorbide mononitrate (IMDUR) 30 MG 24 hr tablet Take 1 tablet (30 mg total) by mouth daily. 05/31/17  Yes Bensimhon, Shaune Pascal, MD  levothyroxine (SYNTHROID, LEVOTHROID) 75 MCG tablet Take 75 mcg by mouth daily before breakfast.   Yes [provider]  losartan (COZAAR) 50 MG tablet Take 1 tablet (50 mg total) by mouth daily. 02/20/18  Yes Bensimhon, Shaune Pascal, MD  metFORMIN (GLUCOPHAGE) 500 MG tablet Take 500 mg by mouth 2 (two) times daily with a meal.    Yes [provider]  Multiple Vitamins-Minerals (ALIVE ONCE DAILY WOMENS 50+ PO) Take 1 tablet by mouth daily.   Yes  [provider]  omeprazole (PRILOSEC) 20 MG capsule Take 20 mg by mouth daily.    Yes [provider]  ondansetron (ZOFRAN) 4 MG tablet Take 1 tablet (4 mg total) by mouth every 8 (eight) hours as needed for nausea or vomiting. 05/13/18  Yes Marchia Bond, MD  potassium chloride (K-DUR,KLOR-CON) 10 MEQ tablet Take 2 tablets (20 mEq total) by mouth daily. 01/24/18  Yes Bensimhon, Shaune Pascal, MD  spironolactone (ALDACTONE) 25 MG tablet Take 1 tablet (25 mg total) by mouth every other day. 06/14/17  Yes Bensimhon, Shaune Pascal, MD  torsemide (DEMADEX) 20 MG tablet Take 1 tablet (20 mg total) by mouth 2 (two) times daily. 11/22/17  Yes Georgiana Shore, NP  Travoprost, BAK Free, (TRAVATAN) 0.004 % SOLN ophthalmic solution Place 1 drop into both eyes daily.   Yes [provider]  nitroGLYCERIN (NITROSTAT) 0.4 MG SL tablet DISSOLVE ONE TABLET UNDER THE TONGUE EVERY 5 MINUTES AS NEEDED FOR CHEST PAIN. Patient not taking: Reported on 05/20/2018 01/14/18   Constance Haw, MD  sennosides-docusate sodium (SENOKOT-S) 8.6-50 MG tablet Take 2 tablets by mouth daily. Patient not taking: Reported on 05/20/2018 05/13/18   Marchia Bond, MD    Physical Exam: Vitals:   05/20/18 1545 05/20/18 1600 05/20/18 1615 05/20/18 1719  BP:    111/85  Pulse: 87 86 86 90  Resp: (!) _0 (!) 30  SpO2: 95% 96% 96%  96%  Weight:      Height:        Constitutional: NAD, calm, comfortable Vitals:   05/20/18 1545 05/20/18 1600 05/20/18 1615 05/20/18 1719  BP:    111/85  Pulse: 87 86 86 90  Resp: (!) _1 (!) 30  SpO2: 95% 96% 96% 96%  Weight:      Height:       Eyes: PERRL, lids and conjunctivae normal ENMT: Mucous membranes are moist. Neck: normal, supple, no masses, no thyromegaly Respiratory: clear to auscultation bilaterally, no wheezing, no crackles.  Cardiovascular: Regular rate and rhythm, no murmurs / rubs / gallops.  Abdomen: no tenderness, no masses palpated. No hepatosplenomegaly. Bowel sounds positive.  Musculoskeletal:RIGHT WRIST swelling and redness . Right knee incision intact.  Skin: no rashes, lesions, ulcers. No induration Neurologic: alert but confused.  Psychiatric:  Normal mood.     Labs on Admission: I have personally reviewed following labs and imaging studies  CBC: Recent Labs  Lab 05/14/18 0355 05/15/18 0513 05/16/18 0527 05/20/18 1321  WBC 10.4 9.7 9.1 14.3*  NEUTROABS  --   --   --  10.6*  HGB 8.1* 7.9* 8.0* 7.9*  HCT 27.4* 26.5* 26.5* 26.9*  MCV 68.5* 69.4* 68.8* 67.3*  PLT 173 172 193 315   Basic Metabolic Panel: Recent Labs  Lab 05/14/18 0355 05/15/18 0513 05/16/18 0527 05/17/18 0518 05/20/18 1321  NA 137 138 138 140 144  K 4.4 4.3 4.2 4.2 4.8  CL 102 102 97* 99 103  CO2 _2 GLUCOSE 123* 95 176* 118* 220*  BUN 43* 52* 48* 45* 27*  CREATININE 2.28* 2.05* 1.81* 1.61* 1.47*  CALCIUM 8.7* 8.9 9.3 9.2 9.4   GFR: Estimated Creatinine Clearance: 38.5 mL/min (A) (by C-G formula based on SCr of 1.47 mg/dL (H)). Liver Function Tests: Recent Labs  Lab 05/20/18 1321  AST 68*  ALT 41  ALKPHOS 64  BILITOT 1.1  PROT 7.3  ALBUMIN 3.2*   Recent Labs  Lab 05/20/18 1321  LIPASE 22   Recent Labs  Lab 05/20/18 1351  AMMONIA 32   Coagulation Profile: No results for input(s): INR, PROTIME in the last 168 hours. Cardiac  Enzymes: No results for input(s): CKTOTAL, CKMB, CKMBINDEX, TROPONINI in the last 168 hours. BNP (last 3 results) No results for input(s): PROBNP in the last 8760 hours. HbA1C: No results for input(s): HGBA1C in the last 72 hours. CBG: Recent Labs  Lab 05/15/18 0714 05/15/18 1153 05/15/18 1845 05/15/18 2143 05/17/18 0824  GLUCAP 94 142* 77 78 131*   Lipid Profile: No results for input(s): CHOL, HDL, LDLCALC, TRIG, CHOLHDL, LDLDIRECT in the last 72 hours. Thyroid Function Tests: No results for input(s): TSH, T4TOTAL, FREET4, T3FREE, THYROIDAB in the last 72 hours. Anemia Panel: No results for input(s): VITAMINB12, FOLATE, FERRITIN, TIBC, IRON, RETICCTPCT in the last 72 hours. Urine analysis:    Component Value Date/Time   COLORURINE YELLOW 05/20/2018 1500   APPEARANCEUR CLEAR 05/20/2018 1500   LABSPEC 1.011 05/20/2018 1500   PHURINE 5.0 05/20/2018 1500   GLUCOSEU >=500 (A) 05/20/2018 1500   HGBUR NEGATIVE 05/20/2018 1500   BILIRUBINUR NEGATIVE 05/20/2018 1500   KETONESUR NEGATIVE 05/20/2018 1500   PROTEINUR NEGATIVE 05/20/2018 1500   NITRITE NEGATIVE 05/20/2018 1500   LEUKOCYTESUR NEGATIVE 05/20/2018 1500    Radiological Exams on Admission: Dg Wrist Complete Right  Result Date: 05/20/2018 CLINICAL DATA:  Gout.  Wrist pain.  No injury EXAM: RIGHT WRIST - COMPLETE 3+ VIEW COMPARISON:  None. FINDINGS: Mild degenerative change of the base of the thumb and in the radiocarpal joint. No erosion or fracture. Diffuse soft tissue swelling around the wrist. Mild arterial calcification. IMPRESSION: Diffuse soft tissue swelling. Negative for fracture or erosion. Mild degenerative change. Electronically Signed   By: Franchot Gallo M.D.   On: 05/20/2018 16:52   Ct Head Wo Contrast  Result Date: 05/20/2018 CLINICAL DATA:  Generalized weakness and altered mental status beginning today. EXAM: CT HEAD WITHOUT CONTRAST TECHNIQUE: Contiguous axial images were obtained from the base of the  skull through the vertex without intravenous contrast. COMPARISON:  None. FINDINGS: Brain: No evidence of acute infarction, hemorrhage, hydrocephalus, extra-axial collection, or mass lesion/mass effect. Mild diffuse cerebral atrophy and chronic small vessel disease. 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 Vascular:  No hyperdense vessel or other acute findings. Skull: No evidence of fracture or other significant bone abnormality. Sinuses/Orbits:  No acute findings. Other: None. IMPRESSION: No acute intracranial abnormality. 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 Electronically Signed   By: Earle Gell M.D.   On: 05/20/2018 16:28   Dg Chest Port 1 View  Result Date: 05/20/2018 CLINICAL DATA:  Mental status change, generalized weakness and new onset of incontinence. EXAM: PORTABLE CHEST 1 VIEW COMPARISON:  02/03/2017 FINDINGS: Stable cardiomegaly with moderate aortic atherosclerosis. No acute pulmonary consolidation nor overt pulmonary edema. No effusion or pneumothorax. Degenerative changes are present about the dorsal spine and about the included shoulders. IMPRESSION: Stable cardiomegaly with moderate aortic atherosclerosis. No active pulmonary disease. Electronically Signed   By: Ashley Royalty M.D.   On: 05/20/2018 14:37    EKG: Independently reviewed. Sinus rhythm with borderline t wave changes in the lateral leads.  Assessment/Plan Active Problems:   Encephalopathy acute  Acute encephalopathy possibly from fever and ? Sepsis and narcotic pain medications.  Check for source of infection.  Empirically on broad spectrum IV antibiotics.     Sepsis:  Febrile, tachycardia. Normal lactic acid.  Elevated CRP.  Rule out septic arthritis. Differential include gout.  Blood cultures ordered and pending.  UA and CXR are negative.  Empirically on broad spectrum antibiotics.     Right wrist swelling: Differentials include acute gout flare up  vs septic arthritis.  - right wrist arthrocentesis by orthopedics.  - fluid sent for culture and analysis.  - on broad spectrum IV antibiotics along with high dose steroids for gout flare up. - appreciate orthopedics recommendations.      Chronic systolic heart failure:  She appears to be compensated.  Resume torsemide.     Hypertension:  BP parameters are borderline,  Holding meds for now.    Type 2 DM: CBG (last 3)  No results for input(s): GLUCAP in the last 72 hours.  Started the patient on SSI and lantus.  Holding all oral meds.   Anemia of chronic disease:  Baseline hemoglobin between 7 to 9.  Currently at 7.9. transfuse to keep hemoglobin greater than 7.  Anemia panel ordered.    Stage 4 CKD:  Creatinine appears to be at baseline.   RIGHT TKA:  On eliquis for anticoagulation.  Get PT/ot EVAL in am.     DVT prophylaxis: Eliquis.  Code Status: full code.  Family Communication: none at bedside.  Disposition Plan: pending clinical improvement and further evaluation.  Consults called: Dr Mardelle Matte with orthopedics.  Admission status: obs/tele.    Hosie Poisson MD [facility] Pager [phone]  If 7PM-7AM, please contact night-coverage [email or web address] Password [facility]  05/20/2018, 6:27 PM

## 2018-05-20 NOTE — Progress Notes (Signed)
Pharmacy Antibiotic Note  Danielle [name] is a 72 y.o. female admitted on 05/20/2018 with sepsis.  Pharmacy has been consulted for vancomycin and zosyn dosing.  Vancomycin 1000mg  and zosyn 3.375g were given in ED 12/24.   SCr 1.47, CrCl ~ 30mL/min  Plan: Vancomycin 500mg  x 1 in addition to 1000mg  given in ED, then 1250mg  every 24h Zosyn 3.375g IV every 8 hours F/u renal function, Cx and clinical progression to narrow Vancomycin levels at steady state   Height: 5\' 4"  (162.6 cm) Weight: 202 lb (91.6 kg) IBW/kg (Calculated) : 54.7  No data recorded.  Recent Labs  Lab 05/14/18 0355 05/15/18 0513 05/16/18 0527 05/17/18 0518 05/20/18 1321 05/20/18 1337  WBC 10.4 9.7 9.1  --  14.3*  --   CREATININE 2.28* 2.05* 1.81* 1.61* 1.47*  --   LATICACIDVEN  --   --   --   --   --  1.75    Estimated Creatinine Clearance: 38.5 mL/min (A) (by C-G formula based on SCr of 1.47 mg/dL (H)).    Allergies  Allergen Reactions  . Other Other (See Comments)    Blood products  . Sulfa Antibiotics Rash  . Uloric [Febuxostat] Rash    Antimicrobials this admission: vanc 12/24>> Zosyn 12/24>>  Dose adjustments this admission: n/a  Microbiology results: 12/24 BCx: sent 12/24 Wrist, Synovial Cx: sent  Bertis Ruddy, PharmD Clinical Pharmacist Please check AMION for all [place] numbers 05/20/2018 6:51 PM

## 2018-05-20 NOTE — Progress Notes (Signed)
Wrist aspirate consistent with gout, positive crystals, WBC in the inflammatory range.    Monitor for actual sepsis, but so far the wrist looks like gout.   Johnny Bridge, MD

## 2018-05-20 NOTE — Discharge Summary (Signed)
Physician Discharge Summary  Patient ID: Danielle [name] MRN: [id number] DOB/AGE: Jan 06, 1946 72 y.o.  Admit date: 05/13/2018 Discharge date: 05/17/2018  Admission Diagnoses:  Primary localized osteoarthritis of right knee  Discharge Diagnoses:  Principal Problem:   Primary localized osteoarthritis of right knee Active Problems:   Acute kidney failure ([place]) Acute blood loss anemia  Past Medical History:  Diagnosis Date  . Acute combined systolic and diastolic congestive heart failure ([place]) 02/05/2017  . Acute kidney failure ([place]) 05/15/2018  . Arthritis   . Blood dyscrasia    per pt-has small blood cells-appears as if anemic  . Breast cancer ([place])   . Cancer [facility])    breast  left  . CHF (congestive heart failure) ([place])   . Complication of anesthesia    difficult to awaken from per pt  . Coronary artery disease   . Diabetes mellitus without complication ([place])    type 2  . Dyspnea   . Essential hypertension   . Heart disease   . Hypothyroidism   . Obesity (BMI 30-39.9) 11/11/2017  . OSA (obstructive sleep apnea) 02/18/2017    severe obstructive sleep apnea with an AHI of 75.6/h and mild central sleep apnea with a CAI of 7.7/h.  Oxygen saturations dropped as low as 82%.   He is on CPAP at 9 cm H2O.  . Personal history of chemotherapy   . Personal history of radiation therapy   . Presence of permanent cardiac pacemaker   . Primary localized osteoarthritis of right knee 05/13/2018  . Sleep apnea   . Thyroid disease    hypothyroid    Surgeries: Procedure(s): TOTAL KNEE ARTHROPLASTY on 05/13/2018   Consultants (if any):   Discharged Condition: Improved  Hospital Course: Danielle [name] is an 72 y.o. female who was admitted 05/13/2018 with a diagnosis of Primary localized osteoarthritis of right knee and went to the operating room on 05/13/2018 and underwent the above named procedures.    She was given perioperative antibiotics:  Anti-infectives (From admission, onward)    Start     Dose/Rate Route Frequency Ordered Stop   05/13/18 1400  ceFAZolin (ANCEF) IVPB 2g/100 mL premix     2 g 200 mL/hr over 30 Minutes Intravenous Every 6 hours 05/13/18 1236 05/13/18 2131   05/13/18 0600  ceFAZolin (ANCEF) IVPB 2g/100 mL premix     2 g 200 mL/hr over 30 Minutes Intravenous On call to O.R. 05/13/18 7353 05/13/18 0728    .  She was given sequential compression devices, early ambulation, and aspirin for DVT prophylaxis.  She benefited maximally from the hospital stay and there were no complications.  She had acute blood loss anemia that was monitored, and being a [employer] we did not transfuse her, and her hemoglobin remained above 8.  She also had acute kidney failure, possibly from low-dose Toradol, she has chronic renal insufficiency to begin with, this was monitored, hydration provided, and she was improving and back to nearly baseline at the time of discharge.  Recent vital signs:  Vitals:   05/16/18 2127 05/17/18 0508  BP: (!) 109/93 123/67  Pulse: 79 75  Resp: 16 18  Temp: 97.8 F (36.6 C) 99.6 F (37.6 C)  SpO2: 97% 98%    Recent laboratory studies:  Lab Results  Component Value Date   HGB 8.0 (L) 05/16/2018   HGB 7.9 (L) 05/15/2018   HGB 8.1 (L) 05/14/2018   Lab Results  Component Value Date   WBC 9.1 05/16/2018   PLT  193 05/16/2018   No results found for: INR Lab Results  Component Value Date   NA 140 05/17/2018   K 4.2 05/17/2018   CL 99 05/17/2018   CO2 26 05/17/2018   BUN 45 (H) 05/17/2018   CREATININE 1.61 (H) 05/17/2018   GLUCOSE 118 (H) 05/17/2018    Discharge Medications:   Allergies as of 05/17/2018      Reactions   Other Other (See Comments)   Blood products   Sulfa Antibiotics Rash   Uloric [febuxostat] Rash      Medication List    STOP taking these medications   acetaminophen 650 MG CR tablet Commonly known as:  TYLENOL   celecoxib 200 MG capsule Commonly known as:  CELEBREX     TAKE these  medications   ACIDOPHILUS PO Take 1 capsule by mouth daily.   ALIVE ONCE DAILY WOMENS 50+ PO Take 1 tablet by mouth daily.   allopurinol 100 MG tablet Commonly known as:  ZYLOPRIM Take 1 tablet (100 mg total) by mouth daily.   apixaban 5 MG Tabs tablet Commonly known as:  ELIQUIS Take 1 tablet (5 mg total) by mouth 2 (two) times daily.   atorvastatin 80 MG tablet Commonly known as:  LIPITOR Take 1 tablet (80 mg total) by mouth at bedtime.   baclofen 10 MG tablet Commonly known as:  LIORESAL Take 1 tablet (10 mg total) by mouth 3 (three) times daily. As needed for muscle spasm   Biotin 10 MG Caps Take 10 mg by mouth daily.   calcium citrate 950 MG tablet Commonly known as:  CALCITRATE - dosed in mg elemental calcium Take 1 tablet by mouth 2 (two) times daily.   carvedilol 25 MG tablet Commonly known as:  COREG Take 1 tablet (25 mg total) by mouth 2 (two) times daily.   CHONDROITIN SULFATE PO Take 250 mg by mouth daily.   empagliflozin 10 MG Tabs tablet Commonly known as:  JARDIANCE Take 10 mg by mouth daily.   ferrous sulfate 325 (65 FE) MG tablet Take 325 mg by mouth 2 (two) times daily with a meal.   gabapentin 300 MG capsule Commonly known as:  NEURONTIN Take 300 mg by mouth daily.   HYDROcodone-acetaminophen 10-325 MG tablet Commonly known as:  NORCO Take 1 tablet by mouth every 6 (six) hours as needed.   isosorbide mononitrate 30 MG 24 hr tablet Commonly known as:  IMDUR Take 1 tablet (30 mg total) by mouth daily.   LANTUS 100 UNIT/ML injection Generic drug:  insulin glargine Inject 30 Units into the skin 2 (two) times daily.   levothyroxine 75 MCG tablet Commonly known as:  SYNTHROID, LEVOTHROID Take 75 mcg by mouth daily before breakfast.   losartan 50 MG tablet Commonly known as:  COZAAR Take 1 tablet (50 mg total) by mouth daily.   metFORMIN 500 MG tablet Commonly known as:  GLUCOPHAGE Take 500 mg by mouth 2 (two) times daily with a  meal.   nitroGLYCERIN 0.4 MG SL tablet Commonly known as:  NITROSTAT DISSOLVE ONE TABLET UNDER THE TONGUE EVERY 5 MINUTES AS NEEDED FOR CHEST PAIN.   omeprazole 20 MG capsule Commonly known as:  PRILOSEC Take 20 mg by mouth daily.   ondansetron 4 MG tablet Commonly known as:  ZOFRAN Take 1 tablet (4 mg total) by mouth every 8 (eight) hours as needed for nausea or vomiting.   potassium chloride 10 MEQ tablet Commonly known as:  K-DUR,KLOR-CON Take 2 tablets (20 mEq  total) by mouth daily.   sennosides-docusate sodium 8.6-50 MG tablet Commonly known as:  SENOKOT-S Take 2 tablets by mouth daily.   spironolactone 25 MG tablet Commonly known as:  ALDACTONE Take 1 tablet (25 mg total) by mouth every other day. Notes to patient:  Last dose given:  12/20 at 1046am   torsemide 20 MG tablet Commonly known as:  DEMADEX Take 1 tablet (20 mg total) by mouth 2 (two) times daily.   traMADol 50 MG tablet Commonly known as:  ULTRAM Take 50 mg by mouth every 8 (eight) hours as needed. for pain   Travoprost (BAK Free) 0.004 % Soln ophthalmic solution Commonly known as:  TRAVATAN Place 1 drop into both eyes daily.       Diagnostic Studies: Dg Knee Right Port  Result Date: 05/13/2018 CLINICAL DATA:  Postop day 0 RIGHT total knee arthroplasty due to severe osteoarthritis. EXAM: PORTABLE RIGHT KNEE - 1-2 VIEW COMPARISON:  06/02/2017. FINDINGS: Anatomic alignment post RIGHT total knee arthroplasty. No acute complicating features. Expected air-fluid level in the joint space. Popliteal artery atherosclerosis. IMPRESSION: Anatomic alignment post RIGHT total knee arthroplasty without acute complicating features. Electronically Signed   By: Evangeline Dakin M.D.   On: 05/13/2018 10:53    Disposition: Discharge disposition: 01-Home or Self Care       Discharge Instructions    Discharge patient   Complete by:  As directed    Discharge disposition:  01-Home or Self Care   Discharge patient  date:  05/17/2018      Follow-up Information    Marchia Bond, MD. Go on 05/27/2018.   Specialty:  Orthopedic Surgery Why:  Your appointment has been made for 9:00  Contact information: [place] [address] [phone]        [facility]. Go on 05/27/2018.   Why:  You are scheduled to start outpatient physical therapy at 10:00 after your appointment with Dr. Mardelle Matte. Please go directly across the hall to do your paperwork after your appointment  Contact information: [place], [place] [address]   [phone]       [facility] Follow up.   Specialty:  Home Health Services Why:  Home Health PT will come to your home for 5 visits prior to your follow up with Dr. Mardelle Matte.  Contact information: [address] [place] [address] [phone]            Signed: Johnny Bridge 05/20/2018, 7:25 AM

## 2018-05-20 NOTE — Progress Notes (Signed)
Orthopedic Tech Progress Note Patient Details:  Danielle [name] 1946-04-18 [id number]  Ortho Devices Type of Ortho Device: Sling arm elevator Ortho Device/Splint Interventions: Adjustment, Application, Ordered   Post Interventions Patient Tolerated: Well Instructions Provided: Care of device, Adjustment of device   Melony Overly T 05/20/2018, 8:13 PM

## 2018-05-20 NOTE — ED Provider Notes (Signed)
[facility] EMERGENCY DEPARTMENT Provider Note   CSN: [id number] Arrival date & time: 05/20/18  1259     History   Chief Complaint Chief Complaint  Patient presents with  . Weakness    HPI Cyanne Delmar is a 72 y.o. female.  HPI Patient had a total right knee replacement 12\21\2019.  She had been discharged from the hospital.  She has been having significant problems with pain control and rehab therapy.  Pain control has been with oxycodone and muscle relaxers.  Her patient's daughter she has been getting increasingly confused and lethargic over the past day.  He has now subsequently developed a fever.  Temperatures been up to 101.3.  Patient was seen in Dr. Luanna Cole office and advised to present to the emergency department for evaluation and treatment.  Patient also complains of wrist pain and swelling on the right.  She does have prior history of gout.  At time of interview, patient was fairly confused and poor historian. Past Medical History:  Diagnosis Date  . Acute combined systolic and diastolic congestive heart failure ([place]) 02/05/2017  . Acute kidney failure ([place]) 05/15/2018  . Arthritis   . Blood dyscrasia    per pt-has small blood cells-appears as if anemic  . Breast cancer ([place])   . Cancer [facility])    breast  left  . CHF (congestive heart failure) ([place])   . Complication of anesthesia    difficult to awaken from per pt  . Coronary artery disease   . Diabetes mellitus without complication ([place])    type 2  . Dyspnea   . Essential hypertension   . Heart disease   . Hypothyroidism   . Obesity (BMI 30-39.9) 11/11/2017  . OSA (obstructive sleep apnea) 02/18/2017    severe obstructive sleep apnea with an AHI of 75.6/h and mild central sleep apnea with a CAI of 7.7/h.  Oxygen saturations dropped as low as 82%.   He is on CPAP at 9 cm H2O.  . Personal history of chemotherapy   . Personal history of radiation therapy   . Presence of permanent cardiac  pacemaker   . Primary localized osteoarthritis of right knee 05/13/2018  . Sleep apnea   . Thyroid disease    hypothyroid    Patient Active Problem List   Diagnosis Date Noted  . Acute kidney failure ([place]) 05/15/2018  . Primary localized osteoarthritis of right knee 05/13/2018  . (HFpEF) heart failure with preserved ejection fraction ([place]) 12/20/2017  . Obesity (BMI 30-39.9) 11/11/2017  . OSA (obstructive sleep apnea) 02/18/2017  . Essential hypertension   . Diabetes mellitus without complication ([place])   . Cancer ([place])   . Thyroid disease     Past Surgical History:  Procedure Laterality Date  . BREAST BIOPSY    . BREAST LUMPECTOMY Left    1985  . BREAST LUMPECTOMY WITH NEEDLE LOCALIZATION AND AXILLARY LYMPH NODE DISSECTION  1985  . BREAST SURGERY Left    partial mastectomy  . CORONARY STENT PLACEMENT  2010, 2012,2017    2 done 2010 and 1 replaced 2012 and 2 replaced in 2017  . EYE SURGERY Bilateral    bilateral lens implant  . INSERT / REPLACE / REMOVE PACEMAKER  07/19/2014  . REPLACEMENT TOTAL KNEE Left 09/2012  . THYROIDECTOMY, PARTIAL  1978  . TONSILLECTOMY    . TOTAL KNEE ARTHROPLASTY Right 05/13/2018   Procedure: TOTAL KNEE ARTHROPLASTY;  Surgeon: Marchia Bond, MD;  Location: [facility] ORS;  Service: Orthopedics;  Laterality: Right;     OB History   No obstetric history on file.      Home Medications    Prior to Admission medications   Medication Sig Start Date End Date Taking? Authorizing Provider  allopurinol (ZYLOPRIM) 100 MG tablet Take 1 tablet (100 mg total) by mouth daily. 11/08/17   Georgiana Shore, NP  apixaban (ELIQUIS) 5 MG TABS tablet Take 1 tablet (5 mg total) by mouth 2 (two) times daily. 04/03/18   Sherran Needs, NP  atorvastatin (LIPITOR) 80 MG tablet Take 1 tablet (80 mg total) by mouth at bedtime. 06/14/17   Bensimhon, Shaune Pascal, MD  baclofen (LIORESAL) 10 MG tablet Take 1 tablet (10 mg total) by mouth 3 (three) times daily. As needed for muscle  spasm 05/13/18   Marchia Bond, MD  Biotin 10 MG CAPS Take 10 mg by mouth daily.    [provider]  calcium citrate (CALCITRATE - DOSED IN MG ELEMENTAL CALCIUM) 950 MG tablet Take 1 tablet by mouth 2 (two) times daily.     [provider]  carvedilol (COREG) 25 MG tablet Take 1 tablet (25 mg total) by mouth 2 (two) times daily. 01/24/18 05/09/18  Bensimhon, Shaune Pascal, MD  CHONDROITIN SULFATE PO Take 250 mg by mouth daily.    [provider]  empagliflozin (JARDIANCE) 10 MG TABS tablet Take 10 mg by mouth daily. 02/13/18   Bensimhon, Shaune Pascal, MD  ferrous sulfate 325 (65 FE) MG tablet Take 325 mg by mouth 2 (two) times daily with a meal.    [provider]  gabapentin (NEURONTIN) 300 MG capsule Take 300 mg by mouth daily.     [provider]  HYDROcodone-acetaminophen (NORCO) 10-325 MG tablet Take 1 tablet by mouth every 6 (six) hours as needed. 05/13/18   Marchia Bond, MD  insulin glargine (LANTUS) 100 UNIT/ML injection Inject 30 Units into the skin 2 (two) times daily.    [provider]  isosorbide mononitrate (IMDUR) 30 MG 24 hr tablet Take 1 tablet (30 mg total) by mouth daily. 05/31/17   Bensimhon, Shaune Pascal, MD  Lactobacillus (ACIDOPHILUS PO) Take 1 capsule by mouth daily.    [provider]  levothyroxine (SYNTHROID, LEVOTHROID) 75 MCG tablet Take 75 mcg by mouth daily before breakfast.    [provider]  losartan (COZAAR) 50 MG tablet Take 1 tablet (50 mg total) by mouth daily. 02/20/18   Bensimhon, Shaune Pascal, MD  metFORMIN (GLUCOPHAGE) 500 MG tablet Take 500 mg by mouth 2 (two) times daily with a meal.     [provider]  Multiple Vitamins-Minerals (ALIVE ONCE DAILY WOMENS 50+ PO) Take 1 tablet by mouth daily.    [provider]  nitroGLYCERIN (NITROSTAT) 0.4 MG SL tablet DISSOLVE ONE TABLET UNDER THE TONGUE EVERY 5 MINUTES AS NEEDED FOR CHEST PAIN. 01/14/18   Camnitz, Ocie Doyne, MD  omeprazole  (PRILOSEC) 20 MG capsule Take 20 mg by mouth daily.     [provider]  ondansetron (ZOFRAN) 4 MG tablet Take 1 tablet (4 mg total) by mouth every 8 (eight) hours as needed for nausea or vomiting. 05/13/18   Marchia Bond, MD  potassium chloride (K-DUR,KLOR-CON) 10 MEQ tablet Take 2 tablets (20 mEq total) by mouth daily. 01/24/18   Bensimhon, Shaune Pascal, MD  sennosides-docusate sodium (SENOKOT-S) 8.6-50 MG tablet Take 2 tablets by mouth daily. 05/13/18   Marchia Bond, MD  spironolactone (ALDACTONE) 25 MG tablet Take 1 tablet (25 mg  total) by mouth every other day. 06/14/17   Bensimhon, Shaune Pascal, MD  torsemide (DEMADEX) 20 MG tablet Take 1 tablet (20 mg total) by mouth 2 (two) times daily. 11/22/17   Georgiana Shore, NP  traMADol (ULTRAM) 50 MG tablet Take 50 mg by mouth every 8 (eight) hours as needed. for pain 03/19/18   [provider]  Travoprost, BAK Free, (TRAVATAN) 0.004 % SOLN ophthalmic solution Place 1 drop into both eyes daily.    [provider]    Family History Family History  Problem Relation Age of Onset  . Multiple myeloma Mother   . Heart disease Father   . Other Sister   . Heart disease Brother   . Diabetes Sister   . Other Sister     Social History Social History   Tobacco Use  . Smoking status: Former Smoker    Packs/day: 1.00    Years: 22.00    Pack years: 22.00    Types: Cigarettes    Last attempt to quit: 1983    Years since quitting: 37.0  . Smokeless tobacco: Never Used  Substance Use Topics  . Alcohol use: Yes    Comment: ocassional  . Drug use: No     Allergies   Other; Sulfa antibiotics; and Uloric [febuxostat]   Review of Systems Review of Systems Level 5 caveat cannot obtain review of systems confusion.  Physical Exam Updated Vital Signs Ht 5' 4"  (1.626 m)   Wt 91.6 kg   SpO2 98%   BMI 34.67 kg/m   Physical Exam Constitutional:      Comments: Patient is awake.  She does not have respiratory distress.   She however is vague in responding to questions and seems slightly confused.  HENT:     Head: Normocephalic and atraumatic.     Mouth/Throat:     Mouth: Mucous membranes are moist.  Eyes:     Extraocular Movements: Extraocular movements intact.  Neck:     Musculoskeletal: Neck supple.  Cardiovascular:     Rate and Rhythm: Normal rate and regular rhythm.  Pulmonary:     Effort: Pulmonary effort is normal.     Breath sounds: Normal breath sounds.  Abdominal:     General: There is no distension.     Palpations: Abdomen is soft.     Tenderness: There is no guarding.  Musculoskeletal:     Comments: Right wrist is swollen and tender to palpation.  Patient resists range of motion.  Neurovascularly intact.  Right knee tenderness to any range of motion.  Calf is soft.  Foot is warm and dry.  Skin:    General: Skin is warm and dry.  Neurological:     Comments: Mild confusion.  Patient does follow commands.  No focal neurologic deficits.      ED Treatments / Results  Labs (all labs ordered are listed, but only abnormal results are displayed) Labs Reviewed  COMPREHENSIVE METABOLIC PANEL  CBC WITH DIFFERENTIAL/PLATELET  I-STAT CG4 LACTIC ACID, ED    EKG EKG Interpretation  Date/Time:  Tuesday May 20 2018 13:10:18 EST Ventricular Rate:  86 PR Interval:    QRS Duration: 112 QT Interval:  376 QTC Calculation: 450 R Axis:   34 Text Interpretation:  Sinus rhythm Ventricular premature complex Borderline intraventricular conduction delay Borderline T abnormalities, lateral leads Minimal ST elevation, anterior leads no sig change from old Confirmed by Charlesetta Shanks [phone]) on 05/20/2018 1:13:29 PM   Radiology No results found.  Procedures  Procedures (including critical care time)  Medications Ordered in ED Medications - No data to display   Initial Impression / Assessment and Plan / ED Course  I have reviewed the triage vital signs and the nursing notes.  Pertinent  labs & imaging results that were available during my care of the patient were reviewed by me and considered in my medical decision making (see chart for details).    Dr. Mardelle Matte has done consultation in the emergency department.  At this time he has low suspicion for the knee as a source of infection or sepsis.  He has aspirated the right wrist to assess for septic joint but has higher suspicion for gout.  Will initiate general septic work-up.  Plan for admission.  Patient is awake and alert without appearance of respiratory depression.  Blood pressures are stable.  Final Clinical Impressions(s) / ED Diagnoses   Final diagnoses:  Fever Arthropathy Mental status change      ED Discharge Orders    None       Charlesetta Shanks, MD 05/30/18 1558

## 2018-05-21 DIAGNOSIS — M10331 Gout due to renal impairment, right wrist: Principal | ICD-10-CM

## 2018-05-21 LAB — RETICULOCYTES
Immature Retic Fract: 15.9 % (ref 2.3–15.9)
RBC.: 3.77 MIL/uL — ABNORMAL LOW (ref 3.87–5.11)
Retic Count, Absolute: 42.2 10*3/uL (ref 19.0–186.0)
Retic Ct Pct: 1.1 % (ref 0.4–3.1)

## 2018-05-21 LAB — BASIC METABOLIC PANEL
Anion gap: 8 (ref 5–15)
BUN: 29 mg/dL — ABNORMAL HIGH (ref 8–23)
CO2: 31 mmol/L (ref 22–32)
Calcium: 8.8 mg/dL — ABNORMAL LOW (ref 8.9–10.3)
Chloride: 101 mmol/L (ref 98–111)
Creatinine, Ser: 1.51 mg/dL — ABNORMAL HIGH (ref 0.44–1.00)
GFR calc Af Amer: 40 mL/min — ABNORMAL LOW (ref >60)
GFR, EST NON AFRICAN AMERICAN: 34 mL/min — AB (ref >60)
GLUCOSE: 330 mg/dL — AB (ref 70–99)
Potassium: 4.7 mmol/L (ref 3.5–5.1)
Sodium: 140 mmol/L (ref 135–145)

## 2018-05-21 LAB — GLUCOSE, CAPILLARY
GLUCOSE-CAPILLARY: 289 mg/dL — AB (ref 70–99)
Glucose-Capillary: 341 mg/dL — ABNORMAL HIGH (ref 70–99)
Glucose-Capillary: 269 mg/dL — ABNORMAL HIGH (ref 70–99)
Glucose-Capillary: 300 mg/dL — ABNORMAL HIGH (ref 70–99)

## 2018-05-21 LAB — CBC
HEMATOCRIT: 24.9 % — AB (ref 36.0–46.0)
Hemoglobin: 7.7 g/dL — ABNORMAL LOW (ref 12.0–15.0)
MCH: 20.6 pg — ABNORMAL LOW (ref 26.0–34.0)
MCHC: 30.9 g/dL (ref 30.0–36.0)
MCV: 66.6 fL — ABNORMAL LOW (ref 80.0–100.0)
Platelets: 260 10*3/uL (ref 150–400)
RBC: 3.74 MIL/uL — ABNORMAL LOW (ref 3.87–5.11)
RDW: 16.2 % — ABNORMAL HIGH (ref 11.5–15.5)
WBC: 11.3 10*3/uL — ABNORMAL HIGH (ref 4.0–10.5)
nRBC: 0 % (ref 0.0–0.2)

## 2018-05-21 LAB — FERRITIN: FERRITIN: 823 ng/mL — AB (ref 11–307)

## 2018-05-21 LAB — IRON AND TIBC
Iron: 15 ug/dL — ABNORMAL LOW (ref 28–170)
Saturation Ratios: 8 % — ABNORMAL LOW (ref 10.4–31.8)
TIBC: 178 ug/dL — ABNORMAL LOW (ref 250–450)
UIBC: 163 ug/dL

## 2018-05-21 LAB — FOLATE: Folate: 45 ng/mL (ref >5.9)

## 2018-05-21 LAB — VITAMIN B12: Vitamin B-12: 526 pg/mL (ref 180–914)

## 2018-05-21 MED ORDER — BACLOFEN 10 MG PO TABS
10.0000 mg | ORAL_TABLET | Freq: Three times a day (TID) | ORAL | Status: DC | PRN
  Administered 2018-05-21: 10 mg via ORAL
  Filled 2018-05-21: qty 1

## 2018-05-21 MED ORDER — SPIRONOLACTONE 25 MG PO TABS
25.0000 mg | ORAL_TABLET | ORAL | Status: DC
  Administered 2018-05-21 – 2018-05-23 (×2): 25 mg via ORAL
  Filled 2018-05-21 (×2): qty 1

## 2018-05-21 MED ORDER — ALLOPURINOL 100 MG PO TABS
100.0000 mg | ORAL_TABLET | Freq: Every day | ORAL | Status: DC
  Administered 2018-05-21 – 2018-05-23 (×3): 100 mg via ORAL
  Filled 2018-05-21 (×3): qty 1

## 2018-05-21 MED ORDER — ADULT MULTIVITAMIN W/MINERALS CH
ORAL_TABLET | Freq: Every day | ORAL | Status: DC
  Administered 2018-05-21 – 2018-05-23 (×3): 1 via ORAL
  Filled 2018-05-21 (×3): qty 1

## 2018-05-21 MED ORDER — POTASSIUM CHLORIDE CRYS ER 20 MEQ PO TBCR
20.0000 meq | EXTENDED_RELEASE_TABLET | Freq: Every day | ORAL | Status: DC
  Administered 2018-05-21 – 2018-05-23 (×3): 20 meq via ORAL
  Filled 2018-05-21 (×3): qty 1

## 2018-05-21 MED ORDER — LATANOPROST 0.005 % OP SOLN
1.0000 [drp] | Freq: Every day | OPHTHALMIC | Status: DC
  Administered 2018-05-22: 1 [drp] via OPHTHALMIC
  Filled 2018-05-21: qty 2.5

## 2018-05-21 MED ORDER — ATORVASTATIN CALCIUM 80 MG PO TABS
80.0000 mg | ORAL_TABLET | Freq: Every day | ORAL | Status: DC
  Administered 2018-05-21 – 2018-05-22 (×2): 80 mg via ORAL
  Filled 2018-05-21 (×2): qty 1

## 2018-05-21 MED ORDER — LOSARTAN POTASSIUM 50 MG PO TABS
50.0000 mg | ORAL_TABLET | Freq: Every day | ORAL | Status: DC
  Administered 2018-05-21 – 2018-05-23 (×3): 50 mg via ORAL
  Filled 2018-05-21 (×3): qty 1

## 2018-05-21 MED ORDER — INSULIN ASPART 100 UNIT/ML ~~LOC~~ SOLN: 0.0000 [IU] | Freq: Every day | SUBCUTANEOUS | Status: DC

## 2018-05-21 MED ORDER — HYDROCODONE-ACETAMINOPHEN 10-325 MG PO TABS
1.0000 | ORAL_TABLET | Freq: Four times a day (QID) | ORAL | Status: DC | PRN
  Administered 2018-05-21 – 2018-05-23 (×6): 1 via ORAL
  Filled 2018-05-21 (×6): qty 1

## 2018-05-21 MED ORDER — ONDANSETRON HCL 4 MG PO TABS: 4.0000 mg | ORAL_TABLET | Freq: Three times a day (TID) | ORAL | Status: DC | PRN

## 2018-05-21 MED ORDER — ISOSORBIDE MONONITRATE ER 30 MG PO TB24
30.0000 mg | ORAL_TABLET | Freq: Every day | ORAL | Status: DC
  Administered 2018-05-21 – 2018-05-23 (×3): 30 mg via ORAL
  Filled 2018-05-21 (×3): qty 1

## 2018-05-21 MED ORDER — CANAGLIFLOZIN 100 MG PO TABS
100.0000 mg | ORAL_TABLET | Freq: Every day | ORAL | Status: DC
  Administered 2018-05-22 – 2018-05-23 (×2): 100 mg via ORAL
  Filled 2018-05-21 (×2): qty 1

## 2018-05-21 MED ORDER — INSULIN ASPART 100 UNIT/ML ~~LOC~~ SOLN
0.0000 [IU] | Freq: Three times a day (TID) | SUBCUTANEOUS | Status: DC
  Administered 2018-05-21: 15 [IU] via SUBCUTANEOUS
  Administered 2018-05-21: 11 [IU] via SUBCUTANEOUS
  Administered 2018-05-22 (×2): 7 [IU] via SUBCUTANEOUS
  Administered 2018-05-22: 11 [IU] via SUBCUTANEOUS
  Administered 2018-05-23: 4 [IU] via SUBCUTANEOUS
  Administered 2018-05-23: 7 [IU] via SUBCUTANEOUS

## 2018-05-21 NOTE — Progress Notes (Signed)
PROGRESS NOTE    [name]  HMC:[id number] DOB: [birth year] DOA: 05/20/2018 PCP: Seward Carol, MD   Brief Narrative:  Danielle [name] is a 72 y.o. 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  Found to have acute right wrist gouty arthritis with crystals noted on aspirate.  Both intra-and extracellular.  Believe she was treated for sepsis but has improved some.  She is on anticoagulation and therefore IV steroids is the recommended treatment.  She is unable to take colchicine and oral steroids or NSAIDs due to her anticoagulation.   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  Sepsis has been ruled out patient is now back to her normal mental status IV antibiotics discontinued   Sepsis: Ruled out Blood cultures ordered and pending.  UA and CXR are negative.  Empiric antibiotics discontinued Suspect picture was consistent with acute gouty attack   Right wrist swelling: Aspiration shows intra-and extracellular crystals consistent with acute gout.  Continue IV steroids.  Chronic systolic heart failure:  She appears to be compensated.  Resume torsemide.   Hypertension:  BP parameters are borderline,  Holding meds for now.    Type 2 DM: Aggressive insulin sliding scale with mealtime correction and nocturnal correction insulin ordered monitor closely  Started the patient on SSI and lantus.  Holding all oral meds.   Anemia of chronic disease:  Baseline hemoglobin between  7 to 9.  Currently at 7.9. transfuse to keep hemoglobin greater than 7.  Anemia panel ordered.    Stage 4 CKD:  Creatinine appears to be at baseline.   RIGHT TKA:  On eliquis for anticoagulation.  Get PT/ot EVAL in am.     DVT prophylaxis: Eliquis.  Code Status: full code.  Family Communication:  Poke with patient's daughter who is a Network engineer on 2 heart unit.  Updated her on her mother's plan of care.  Answered all questions. Disposition Plan:  Home when symptoms improve Consults called: Dr Mardelle Matte with orthopedics.  Admission status:  To inpatient status  Subjective: Still feels rather poorly difficulty moving her right wrist cannot use her hand.  Will need a day or so more to see if flare improves.  Objective: Vitals:   05/20/18 1942 05/20/18 2327 05/21/18 0430 05/21/18 0832  BP: (!) 169/65 (!) 117/51 (!) 152/63 (!) 102/54  Pulse: 87 68 64 73  Resp: 18 18 18 18   Temp: (!) 101.7 F (38.7 C) 99 F (37.2 C) 98.1 F (36.7 C) 97.7 F (36.5 C)  TempSrc: Oral Oral Oral Oral  SpO2: 95% 97% 98% 99%  Weight:      Height:        Intake/Output Summary (Last 24 hours) at 05/21/2018 1123 Last data filed at 05/21/2018 6629 Gross per 24 hour  Intake 1260.01 ml  Output 700 ml  Net 560.01 ml   Filed Weights   05/20/18 1315  Weight: 91.6 kg    Examination:  General exam: Appears calm and comfortable  Respiratory system: Clear to auscultation. Respiratory effort normal. Cardiovascular system: S1 & S2 heard,  RRR. No JVD, murmurs, rubs, gallops or clicks. No pedal edema. Gastrointestinal system: Abdomen is nondistended, soft and nontender. No organomegaly or masses felt. Normal bowel sounds heard. Central nervous system: Alert and oriented. No focal neurological deficits. Extremities: Symmetric 5 x 5 power.  Right wrist swollen and in a splint Skin: No rashes, lesions or ulcers Psychiatry: Judgement and insight appear normal. Mood & affect appropriate.     Data  Reviewed: I have personally reviewed following labs and imaging studies  CBC: Recent Labs  Lab 05/15/18 0513 05/16/18 0527 05/20/18 1321 05/21/18 0346  WBC 9.7 9.1 14.3* 11.3*  NEUTROABS  --   --  10.6*  --   HGB 7.9* 8.0* 7.9* 7.7*  HCT 26.5* 26.5* 26.9* 24.9*  MCV 69.4* 68.8* 67.3* 66.6*  PLT 172 193 269 233   Basic Metabolic Panel: Recent Labs  Lab 05/15/18 0513 05/16/18 0527 05/17/18 0518 05/20/18 1321 05/21/18 0346  NA 138 138 140 144 140  K 4.3 4.2 4.2 4.8 4.7  CL 102 97* 99 103 101  CO2 26 26 26 29 31   GLUCOSE 95 176* 118* 220* 330*  BUN 52* 48* 45* 27* 29*  CREATININE 2.05* 1.81* 1.61* 1.47* 1.51*  CALCIUM 8.9 9.3 9.2 9.4 8.8*   GFR: Estimated Creatinine Clearance: 37.5 mL/min (A) (by C-G formula based on SCr of 1.51 mg/dL (H)). Liver Function Tests: Recent Labs  Lab 05/20/18 1321  AST 68*  ALT 41  ALKPHOS 64  BILITOT 1.1  PROT 7.3  ALBUMIN 3.2*   Recent Labs  Lab 05/20/18 1321  LIPASE 22   Recent Labs  Lab 05/20/18 1351  AMMONIA 32   Coagulation Profile: No results for input(s): INR, PROTIME in the last 168 hours. Cardiac Enzymes: No results for input(s): CKTOTAL, CKMB, CKMBINDEX, TROPONINI in the last 168 hours. BNP (last 3 results) No results for input(s): PROBNP in the last 8760 hours. HbA1C: No results for input(s): HGBA1C in the last 72 hours. CBG: Recent Labs  Lab 05/15/18 1845 05/15/18 2143 05/17/18 0824 05/20/18 2108 05/21/18 0608  GLUCAP 77 78 131* 201* 289*   Lipid Profile: No results for input(s): CHOL, HDL, LDLCALC, TRIG, CHOLHDL, LDLDIRECT in the last 72 hours. Thyroid Function Tests: No results for input(s): TSH, T4TOTAL, FREET4, T3FREE, THYROIDAB in the last 72 hours. Anemia Panel: Recent Labs    05/21/18 0346  VITAMINB12 526  FOLATE 45.0  FERRITIN 823*  TIBC 178*  IRON 15*  RETICCTPCT 1.1   Sepsis Labs: Recent Labs  Lab 05/20/18 1337  LATICACIDVEN 1.75    Recent Results (from the past 240 hour(s))   Culture, blood (routine x 2)     Status: None (Preliminary result)   Collection Time: 05/20/18  1:21 PM  Result Value Ref Range Status   Specimen Description BLOOD LEFT ANTECUBITAL  Final   Special Requests   Final    BOTTLES DRAWN AEROBIC AND ANAEROBIC Blood Culture adequate volume Performed at [facility], [place] [address] [place], [place] [address]    Culture NO GROWTH < 12 HOURS  Final   Report Status PENDING  Incomplete  Body fluid culture     Status: None (Preliminary result)   Collection Time: 05/20/18  4:02 PM  Result Value Ref Range Status   Specimen Description FLUID SYNOVIAL RIGHT WRIST  Final   Special Requests Normal  Final   Gram Stain   Final    ABUNDANT WBC PRESENT,BOTH PMN AND MONONUCLEAR NO ORGANISMS SEEN    Culture  Final    NO GROWTH < 24 HOURS Performed at [facility], [place] [address], [place], [place] [address]    Report Status PENDING  Incomplete  Culture, blood (routine x 2)     Status: None (Preliminary result)   Collection Time: 05/20/18  4:43 PM  Result Value Ref Range Status   Specimen Description SITE NOT SPECIFIED  Final   Special Requests   Final    BOTTLES DRAWN AEROBIC AND ANAEROBIC Blood Culture adequate volume Performed at [facility], [place] [address], [place], [place] [address]    Culture NO GROWTH < 12 HOURS  Final   Report Status PENDING  Incomplete         Radiology Studies: Dg Wrist Complete Right  Result Date: 05/20/2018 CLINICAL DATA:  Gout.  Wrist pain.  No injury EXAM: RIGHT WRIST - COMPLETE 3+ VIEW COMPARISON:  None. FINDINGS: Mild degenerative change of the base of the thumb and in the radiocarpal joint. No erosion or fracture. Diffuse soft tissue swelling around the wrist. Mild arterial calcification. IMPRESSION: Diffuse soft tissue swelling. Negative for fracture or erosion. Mild degenerative change. Electronically Signed   By: Franchot Gallo M.D.   On: 05/20/2018 16:52   Ct Head Wo Contrast  Result  Date: 05/20/2018 CLINICAL DATA:  Generalized weakness and altered mental status beginning today. EXAM: CT HEAD WITHOUT CONTRAST TECHNIQUE: Contiguous axial images were obtained from the base of the skull through the vertex without intravenous contrast. COMPARISON:  None. FINDINGS: Brain: No evidence of acute infarction, hemorrhage, hydrocephalus, extra-axial collection, or mass lesion/mass effect. Mild diffuse cerebral atrophy and chronic small vessel disease. 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 Vascular:  No hyperdense vessel or other acute findings. Skull: No evidence of fracture or other significant bone abnormality. Sinuses/Orbits:  No acute findings. Other: None. IMPRESSION: No acute intracranial abnormality. 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 Electronically Signed   By: Earle Gell M.D.   On: 05/20/2018 16:28   Dg Chest Port 1 View  Result Date: 05/20/2018 CLINICAL DATA:  Mental status change, generalized weakness and new onset of incontinence. EXAM: PORTABLE CHEST 1 VIEW COMPARISON:  02/03/2017 FINDINGS: Stable cardiomegaly with moderate aortic atherosclerosis. No acute pulmonary consolidation nor overt pulmonary edema. No effusion or pneumothorax. Degenerative changes are present about the dorsal spine and about the included shoulders. IMPRESSION: Stable cardiomegaly with moderate aortic atherosclerosis. No active pulmonary disease. Electronically Signed   By: Ashley Royalty M.D.   On: 05/20/2018 14:37        Scheduled Meds: . apixaban  5 mg Oral BID  . calcium citrate  1 tablet Oral BID  . carvedilol  25 mg Oral BID WC  . ferrous sulfate  325 mg Oral BID WC  . gabapentin  300 mg Oral Daily  . insulin aspart  0-5 Units Subcutaneous QHS  . insulin aspart  0-9 Units Subcutaneous TID WC  . insulin glargine  30 Units Subcutaneous BID  . levothyroxine  75 mcg Oral QAC breakfast  . methylPREDNISolone (SOLU-MEDROL) injection  80  mg Intravenous Q8H  . pantoprazole  40 mg Oral Daily  . torsemide  20 mg Oral BID   Continuous Infusions: . sodium chloride 125 mL/hr at 05/21/18 0923     LOS: 0 days    Time spent: 45 minutes    Lady Deutscher, MD [facility] Pager [phone]  If 7PM-7AM, please contact night-coverage [email or web address]  Password [email or web address] 05/21/2018, 11:23 AM

## 2018-05-21 NOTE — Progress Notes (Addendum)
Orthopedic Trauma Service Progress Note Weekend/Holiday Coverage   Patient ID: Danielle [name] MRN: [id number] DOB/AGE: 07/17/1945 72 y.o.  Subjective:  Doing much better today  Up at sink with therapy doing hygiene  No other issues of note  R wrist synovial fluid analysis consistent with gout flare States she has had previous flare in L hand but does not recall how long ago   States Her R knee is feeling better as well and is able to bear weight on it well. She is also able to bear some weight through her R wrist today with her wrist splint on   WBC count trending down as well  Afebrile  cbgs markedly elevated this am as well  Blood and body fluid cultures with NGTD   Denies any CP, SOB No N/V No abd pain   ROS As above Objective:   VITALS:   Vitals:   05/20/18 1942 05/20/18 2327 05/21/18 0430 05/21/18 0832  BP: (!) 169/65 (!) 117/51 (!) 152/63 (!) 102/54  Pulse: 87 68 64 73  Resp: 18 18 18 18   Temp: (!) 101.7 F (38.7 C) 99 F (37.2 C) 98.1 F (36.7 C) 97.7 F (36.5 C)  TempSrc: Oral Oral Oral Oral  SpO2: 95% 97% 98% 99%  Weight:      Height:        Estimated body mass index is 34.67 kg/m as calculated from the following:   Height as of this encounter: 5\' 4"  (1.626 m).   Weight as of this encounter: 91.6 kg.   Intake/Output      12/24 0701 - 12/25 0700 12/25 0701 - 12/26 0700   P.O.  360   I.V. (mL/kg) 666.5 (7.3)    IV Piggyback 233.5    Total Intake(mL/kg) 900 (9.8) 360 (3.9)   Urine (mL/kg/hr) 400 300 (0.8)   Stool 0 0   Total Output 400 300   Net +500 +60        Urine Occurrence 1 x 1 x   Stool Occurrence 1 x 1 x     LABS  Results for orders placed or performed during the hospital encounter of 05/20/18 (from the past 24 hour(s))  Comprehensive metabolic panel     Status: Abnormal   Collection Time: 05/20/18  1:21 PM  Result Value Ref Range   Sodium 144 135 - 145  mmol/L   Potassium 4.8 3.5 - 5.1 mmol/L   Chloride 103 98 - 111 mmol/L   CO2 29 22 - 32 mmol/L   Glucose, Bld 220 (H) 70 - 99 mg/dL   BUN 27 (H) 8 - 23 mg/dL   Creatinine, Ser 1.47 (H) 0.44 - 1.00 mg/dL   Calcium 9.4 8.9 - 10.3 mg/dL   Total Protein 7.3 6.5 - 8.1 g/dL   Albumin 3.2 (L) 3.5 - 5.0 g/dL   AST 68 (H) 15 - 41 U/L   ALT 41 0 - 44 U/L   Alkaline Phosphatase 64 38 - 126 U/L   Total Bilirubin 1.1 0.3 - 1.2 mg/dL   GFR calc non Af Amer 36 (L) >60 mL/min   GFR calc Af Amer 41 (L) >60 mL/min   Anion gap 12 5 - 15  CBC with Differential     Status: Abnormal   Collection Time: 05/20/18  1:21 PM  Result  Value Ref Range   WBC 14.3 (H) 4.0 - 10.5 K/uL   RBC 4.00 3.87 - 5.11 MIL/uL   Hemoglobin 7.9 (L) 12.0 - 15.0 g/dL   HCT 26.9 (L) 36.0 - 46.0 %   MCV 67.3 (L) 80.0 - 100.0 fL   MCH 19.8 (L) 26.0 - 34.0 pg   MCHC 29.4 (L) 30.0 - 36.0 g/dL   RDW 16.4 (H) 11.5 - 15.5 %   Platelets 269 150 - 400 K/uL   nRBC 0.0 0.0 - 0.2 %   Neutrophils Relative % 74 %   Neutro Abs 10.6 (H) 1.7 - 7.7 K/uL   Lymphocytes Relative 18 %   Lymphs Abs 2.6 0.7 - 4.0 K/uL   Monocytes Relative 7 %   Monocytes Absolute 1.0 0.1 - 1.0 K/uL   Eosinophils Relative 0 %   Eosinophils Absolute 0.0 0.0 - 0.5 K/uL   Basophils Relative 1 %   Basophils Absolute 0.1 0.0 - 0.1 K/uL   nRBC 0 0 /100 WBC   Abs Immature Granulocytes 0.00 0.00 - 0.07 K/uL   Acanthocytes PRESENT    Schistocytes PRESENT    Tear Drop Cells PRESENT    Burr Cells PRESENT    Polychromasia PRESENT   Lipase, blood     Status: None   Collection Time: 05/20/18  1:21 PM  Result Value Ref Range   Lipase 22 11 - 51 U/L  Culture, blood (routine x 2)     Status: None (Preliminary result)   Collection Time: 05/20/18  1:21 PM  Result Value Ref Range   Specimen Description BLOOD LEFT ANTECUBITAL    Special Requests      BOTTLES DRAWN AEROBIC AND ANAEROBIC Blood Culture adequate volume Performed at [facility], [address]  [place], [place] [address]    Culture NO GROWTH < 12 HOURS    Report Status PENDING   I-Stat CG4 Lactic Acid, ED     Status: None   Collection Time: 05/20/18  1:37 PM  Result Value Ref Range   Lactic Acid, Venous 1.75 0.5 - 1.9 mmol/L  I-stat troponin, ED     Status: None   Collection Time: 05/20/18  1:40 PM  Result Value Ref Range   Troponin i, poc 0.07 0.00 - 0.08 ng/mL   Comment 3          Ammonia     Status: None   Collection Time: 05/20/18  1:51 PM  Result Value Ref Range   Ammonia 32 9 - 35 umol/L  Urinalysis, Routine w reflex microscopic     Status: Abnormal   Collection Time: 05/20/18  3:00 PM  Result Value Ref Range   Color, Urine YELLOW YELLOW   APPearance CLEAR CLEAR   Specific Gravity, Urine 1.011 1.005 - 1.030   pH 5.0 5.0 - 8.0   Glucose, UA >=500 (A) NEGATIVE mg/dL   Hgb urine dipstick NEGATIVE NEGATIVE   Bilirubin Urine NEGATIVE NEGATIVE   Ketones, ur NEGATIVE NEGATIVE mg/dL   Protein, ur NEGATIVE NEGATIVE mg/dL   Nitrite NEGATIVE NEGATIVE   Leukocytes, UA NEGATIVE NEGATIVE   RBC / HPF 0-5 0 - 5 RBC/hpf   WBC, UA 0-5 0 - 5 WBC/hpf   Bacteria, UA RARE (A) NONE SEEN   Squamous Epithelial / LPF 0-5 0 - 5  Synovial cell count + diff, w/ crystals     Status: Abnormal   Collection Time: 05/20/18  3:58 PM  Result Value Ref Range   Color, Synovial  YELLOW (A) YELLOW   Appearance-Synovial TURBID (A) CLEAR   Crystals, Fluid INTRACELLULAR MONOSODIUM URATE CRYSTALS    WBC, Synovial 43,000 (H) 0 - 200 /cu mm   Neutrophil, Synovial 98 (H) 0 - 25 %   Lymphocytes-Synovial Fld 0 0 - 20 %   Monocyte-Macrophage-Synovial Fluid 2 (L) 50 - 90 %   Eosinophils-Synovial 0 0 - 1 %  Body fluid culture     Status: None (Preliminary result)   Collection Time: 05/20/18  4:02 PM  Result Value Ref Range   Specimen Description FLUID SYNOVIAL RIGHT WRIST    Special Requests Normal    Gram Stain      ABUNDANT WBC PRESENT,BOTH PMN AND MONONUCLEAR NO ORGANISMS SEEN    Culture      NO  GROWTH < 24 HOURS Performed at [facility], [place] [address], [place], [place] [address]    Report Status PENDING   C-reactive protein     Status: Abnormal   Collection Time: 05/20/18  4:42 PM  Result Value Ref Range   CRP 31.4 (H) <1.0 mg/dL  Sedimentation rate     Status: Abnormal   Collection Time: 05/20/18  4:42 PM  Result Value Ref Range   Sed Rate 91 (H) 0 - 22 mm/hr  Culture, blood (routine x 2)     Status: None (Preliminary result)   Collection Time: 05/20/18  4:43 PM  Result Value Ref Range   Specimen Description SITE NOT SPECIFIED    Special Requests      BOTTLES DRAWN AEROBIC AND ANAEROBIC Blood Culture adequate volume Performed at [facility], [place] [address], [place], [place] [address]    Culture NO GROWTH < 12 HOURS    Report Status PENDING   Glucose, capillary     Status: Abnormal   Collection Time: 05/20/18  9:08 PM  Result Value Ref Range   Glucose-Capillary 201 (H) 70 - 99 mg/dL   Comment 1 Notify RN    Comment 2 Document in Chart   CBC     Status: Abnormal   Collection Time: 05/21/18  3:46 AM  Result Value Ref Range   WBC 11.3 (H) 4.0 - 10.5 K/uL   RBC 3.74 (L) 3.87 - 5.11 MIL/uL   Hemoglobin 7.7 (L) 12.0 - 15.0 g/dL   HCT 24.9 (L) 36.0 - 46.0 %   MCV 66.6 (L) 80.0 - 100.0 fL   MCH 20.6 (L) 26.0 - 34.0 pg   MCHC 30.9 30.0 - 36.0 g/dL   RDW 16.2 (H) 11.5 - 15.5 %   Platelets 260 150 - 400 K/uL   nRBC 0.0 0.0 - 0.2 %  Basic metabolic panel     Status: Abnormal   Collection Time: 05/21/18  3:46 AM  Result Value Ref Range   Sodium 140 135 - 145 mmol/L   Potassium 4.7 3.5 - 5.1 mmol/L   Chloride 101 98 - 111 mmol/L   CO2 31 22 - 32 mmol/L   Glucose, Bld 330 (H) 70 - 99 mg/dL   BUN 29 (H) 8 - 23 mg/dL   Creatinine, Ser 1.51 (H) 0.44 - 1.00 mg/dL   Calcium 8.8 (L) 8.9 - 10.3 mg/dL   GFR calc non Af Amer 34 (L) >60 mL/min   GFR calc Af Amer 40 (L) >60 mL/min   Anion gap 8 5 - 15  Vitamin B12     Status: None   Collection Time: 05/21/18   3:46 AM  Result Value Ref  Range   Vitamin B-12 526 180 - 914 pg/mL  Folate     Status: None   Collection Time: 05/21/18  3:46 AM  Result Value Ref Range   Folate 45.0 >5.9 ng/mL  Iron and TIBC     Status: Abnormal   Collection Time: 05/21/18  3:46 AM  Result Value Ref Range   Iron 15 (L) 28 - 170 ug/dL   TIBC 178 (L) 250 - 450 ug/dL   Saturation Ratios 8 (L) 10.4 - 31.8 %   UIBC 163 ug/dL  Ferritin     Status: Abnormal   Collection Time: 05/21/18  3:46 AM  Result Value Ref Range   Ferritin 823 (H) 11 - 307 ng/mL  Reticulocytes     Status: Abnormal   Collection Time: 05/21/18  3:46 AM  Result Value Ref Range   Retic Ct Pct 1.1 0.4 - 3.1 %   RBC. 3.77 (L) 3.87 - 5.11 MIL/uL   Retic Count, Absolute 42.2 19.0 - 186.0 K/uL   Immature Retic Fract 15.9 2.3 - 15.9 %  Glucose, capillary     Status: Abnormal   Collection Time: 05/21/18  6:08 AM  Result Value Ref Range   Glucose-Capillary 289 (H) 70 - 99 mg/dL   Comment 1 Notify RN    Comment 2 Document in Chart    CBG (last 3)  Recent Labs    05/20/18 2108 05/21/18 0608  GLUCAP 201* 289*     PHYSICAL EXAM:   Gen: standing up at sink brushing teeth. Therapy supporting but pt doing things on her own. 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   Anti-infectives (From admission, onward)   Start     Dose/Rate  Route Frequency Ordered Stop   05/21/18 1600  vancomycin (VANCOCIN) 1,250 mg in sodium chloride 0.9 % 250 mL IVPB  Status:  Discontinued     1,250 mg 166.7 mL/hr over 90 Minutes Intravenous Every 24 hours 05/20/18 1857 05/21/18 0952   05/21/18 0100  piperacillin-tazobactam (ZOSYN) IVPB 3.375 g  Status:  Discontinued     3.375 g 12.5 mL/hr over 240 Minutes Intravenous Every 8 hours 05/20/18 1856 05/21/18 0952   05/20/18 1900  vancomycin (VANCOCIN) 500 mg in sodium chloride 0.9 % 100 mL IVPB     500 mg 100 mL/hr over 60 Minutes Intravenous  Once 05/20/18 1855 05/21/18 0039   05/20/18 1615  piperacillin-tazobactam (ZOSYN) IVPB 3.375 g     3.375 g 100 mL/hr over 30 Minutes Intravenous  Once 05/20/18 1608 05/20/18 1716   05/20/18 1615  vancomycin (VANCOCIN) IVPB 1000 mg/200 mL premix     1,000 mg 200 mL/hr over 60 Minutes Intravenous  Once 05/20/18 1608 05/20/18 1915    .  POD/HD#: 1  72 y/o female admitted for acute encephalopathy, now 8 days s/p R TKA found to have R wrist gout flare   -acute gout flare R wrist   Continue with symptomatic management   Wrist splint   Ice    Elevation    Continue with ROM as tolerated   WBAT through R wrist    Would continue with corticosteroids for acute gout flare in setting of renal disease/injury  - R TKA  WBAT   ROM as tolerated   Therapies   - ABL anemia/Hemodynamics  Per primary   Pt Jehovah's Witness   - Medical issues   Per Primary    CBGs markedly elevated this am    Pt on SSI and lantus currently     Home regimen includes several oral meds    Defer to Medicine about restarting these    - DVT/PE prophylaxis:  On eliquis for TKA   - ID:   abx dc'd as clinical picture does not favor sepsis given MSU crystals on synovial fluid analysis   - Dispo:  Ortho issues stable  Continue per medical service     Jari Pigg, PA-C [phone] (C) 05/21/2018, 11:03 AM  [facility] [place] [address] [phone] (O) [phone] (F)

## 2018-05-21 NOTE — Evaluation (Signed)
Physical Therapy Evaluation Patient Details Name: Danielle [name] MRN: [id number] DOB: May 01, 1946 Today's Date: 05/21/2018   History of Present Illness  Patient is a 72 y/o female presenting to the ED on 05/20/18 with primary complaints of confusion, fever. 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 Associated R knee pain as well as R wrist pain. CXR No active pulmonary disease. 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 Wrist x rays show Diffuse soft tissue swelling. Negative for fracture or erosion. Mild degenerative change. 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    Clinical Impression  Patient admitted with the above listed diagnosis. Of note, recent hospital admission for R TKA. Patient today requiring light Min A to min guard for mobility - good weight bearing through R LE without evidence of instability. Does report pain at right wrist and initially hesitant to bear weight, however, bale to self progress to allow for weight bearing of R UE. PT to follow acutely to maximize safe and independent mobility prior to return home.     Follow Up Recommendations Follow surgeon's recommendation for DC plan and follow-up therapies;Supervision for mobility/OOB;Home health PT    Equipment Recommendations  None recommended by PT(has DME from recent admission)    Recommendations for Other Services OT consult(as ordered)     Precautions / Restrictions Precautions Precautions: Fall Restrictions Weight Bearing Restrictions: Yes RUE Weight Bearing: Weight bearing as tolerated(pain limiting) RLE Weight Bearing: Weight bearing as tolerated      Mobility  Bed Mobility               General bed mobility comments: OOB in recliner   Transfers Overall transfer level: Needs assistance Equipment used: Rolling walker (2 wheeled) Transfers: Sit to/from  Stand Sit to Stand: Min assist;Min guard         General transfer comment: light Min A to power up from recliner  Ambulation/Gait Ambulation/Gait assistance: Min guard;Supervision Gait Distance (Feet): 150 Feet Assistive device: Rolling walker (2 wheeled) Gait Pattern/deviations: Step-through pattern;Decreased stride length Gait velocity: decreased   General Gait Details: does well with RW - cueing to place R wrist on RW for balance -  patient able to self progress to allow for some weight bearing through this joint. No LOB or overt instability  Stairs            Wheelchair Mobility    Modified Rankin (Stroke Patients Only)       Balance Overall balance assessment: Mild deficits observed, not formally tested                                           Pertinent Vitals/Pain Pain Assessment: Faces Faces Pain Scale: Hurts little more Pain Location: R wrist Pain Descriptors / Indicators: Aching;Discomfort;Grimacing;Guarding("it's killing me") Pain Intervention(s): Limited activity within patient's tolerance;Monitored during session;Repositioned    Home Living Family/patient expects to be discharged to:: Private residence Living Arrangements: Other relatives(grandson/daughter) Available Help at Discharge: Family;Available PRN/intermittently Type of Home: Apartment Home Access: Level entry     Home Layout: One level Home Equipment: Cane - single point;Walker - 4 wheels;Bedside commode      Prior Function           Comments: prior to TKA was independent; since recent admission required some assist for ADLs/mobility  Hand Dominance   Dominant Hand: Right    Extremity/Trunk Assessment   Upper Extremity Assessment Upper Extremity Assessment: Defer to OT evaluation    Lower Extremity Assessment Lower Extremity Assessment: Generalized weakness;RLE deficits/detail RLE Deficits / Details: does well with weight shifting onto R LE; no  buckling with gait RLE Sensation: WNL    Cervical / Trunk Assessment Cervical / Trunk Assessment: Normal  Communication   Communication: No difficulties  Cognition Arousal/Alertness: Awake/alert Behavior During Therapy: WFL for tasks assessed/performed Overall Cognitive Status: Within Functional Limits for tasks assessed                                        General Comments      Exercises     Assessment/Plan    PT Assessment Patient needs continued PT services  PT Problem List Decreased strength;Pain;Decreased range of motion;Decreased activity tolerance;Decreased knowledge of use of DME;Decreased balance;Decreased mobility       PT Treatment Interventions DME instruction;Therapeutic activities;Gait training;Therapeutic exercise;Patient/family education;Balance training;Functional mobility training    PT Goals (Current goals can be found in the Care Plan section)  Acute Rehab PT Goals Patient Stated Goal: return home tomorrow PT Goal Formulation: With patient Time For Goal Achievement: 06/04/18 Potential to Achieve Goals: Good    Frequency Min 3X/week   Barriers to discharge        Co-evaluation PT/OT/SLP Co-Evaluation/Treatment: Yes Reason for Co-Treatment: Complexity of the patient's impairments (multi-system involvement);For patient/therapist safety;To address functional/ADL transfers PT goals addressed during session: Mobility/safety with mobility;Balance;Proper use of DME         AM-PAC PT "6 Clicks" Mobility  Outcome Measure Help needed turning from your back to your side while in a flat bed without using bedrails?: A Little Help needed moving from lying on your back to sitting on the side of a flat bed without using bedrails?: A Little Help needed moving to and from a bed to a chair (including a wheelchair)?: A Little Help needed standing up from a chair using your arms (e.g., wheelchair or bedside chair)?: A Little Help needed to walk  in hospital room?: A Little Help needed climbing 3-5 steps with a railing? : A Little 6 Click Score: 18    End of Session Equipment Utilized During Treatment: Gait belt Activity Tolerance: Patient tolerated treatment well Patient left: in chair;with call bell/phone within reach;with chair alarm set Nurse Communication: Mobility status PT Visit Diagnosis: Unsteadiness on feet (R26.81);Other abnormalities of gait and mobility (R26.89)    Time: 3335-4562 PT Time Calculation (min) (ACUTE ONLY): 27 min   Charges:   PT Evaluation $PT Eval Moderate Complexity: 1 Mod         Lanney Gins, PT, DPT Supplemental Physical Therapist 05/21/18 11:31 AM Pager: [phone] Office: [phone]

## 2018-05-21 NOTE — Progress Notes (Signed)
Orthopedic Tech Progress Note Patient Details:  Danielle [name] 09-11-1945 [id number]  Ortho Devices Type of Ortho Device: Velcro wrist splint Ortho Device/Splint Interventions: Application, Adjustment, Ordered   Post Interventions Patient Tolerated: Well Instructions Provided: Care of device, Adjustment of device   Melony Overly T 05/21/2018, 3:32 AM

## 2018-05-21 NOTE — Evaluation (Signed)
Occupational Therapy Evaluation Patient Details Name: Danielle [name] MRN: [id number] DOB: 08/14/1945 Today's Date: 05/21/2018    History of Present Illness Patient is a 72 y/o female presenting to the ED on 05/20/18 with primary complaints of confusion, fever. 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 Associated R knee pain as well as R wrist pain. CXR No active pulmonary disease. 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 Wrist x rays show Diffuse soft tissue swelling. Negative for fracture or erosion. Mild degenerative change. 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   Clinical Impression   This 72 y/o female presents with the above. Prior to pt's initial TKA, pt was independent with ADLs and functional mobility. Since return home and prior to this admission pt was receiving some assist for ADLs. Pt performing functional mobility using RW with overall minA. Pt currently requires minA for standing grooming ADL, modA for LB ADL. Pt with increased pain and decreased fine motor in RUE due to pain, however given increased time/effort is able to perform functional tasks. Pt will benefit from continued acute OT services and recommend follow up therapy services in [facility] setting after discharge to maximize her safety and independence with ADLs and mobility. Will follow.     Follow Up Recommendations  Supervision/Assistance - 24 hour;Follow surgeon's recommendation for DC plan and follow-up therapies;Home health OT    Equipment Recommendations  None recommended by OT(pt's DME needs are met)           Precautions / Restrictions Precautions Precautions: Fall Required Braces or Orthoses: Other Brace Other Brace: pt with R wrist brace for comfort/pain management Restrictions Weight Bearing Restrictions: Yes RUE Weight Bearing: Weight bearing as tolerated(pain  limiting) RLE Weight Bearing: Weight bearing as tolerated      Mobility Bed Mobility               General bed mobility comments: OOB in recliner   Transfers Overall transfer level: Needs assistance Equipment used: Rolling walker (2 wheeled) Transfers: Sit to/from Stand Sit to Stand: Min assist;Min guard         General transfer comment: light Min A to power up from recliner    Balance Overall balance assessment: Mild deficits observed, not formally tested                                         ADL either performed or assessed with clinical judgement   ADL Overall ADL's : Needs assistance/impaired Eating/Feeding: Modified independent;Sitting   Grooming: Wash/dry hands;Oral care;Min guard;Minimal assistance;Standing Grooming Details (indicate cue type and reason): pt stood at sink >5 min during grooming task; intermittently using forearm support on sink counter; with increased time able to use R hand for fine motor portions of task Upper Body Bathing: Min guard;Sitting   Lower Body Bathing: Moderate assistance;Sit to/from stand Lower Body Bathing Details (indicate cue type and reason): to access LEs Upper Body Dressing : Set up;Min guard;Sitting   Lower Body Dressing: Moderate assistance;Sit to/from stand Lower Body Dressing Details (indicate cue type and reason): minA standing balance Toilet Transfer: Minimal assistance;Ambulation;BSC;RW Toilet Transfer Details (indicate cue type and reason): BSC over toilet; simulated in transfer to/from recliner, ambulatory in room and hallwy Toileting- Clothing Manipulation and Hygiene: Minimal assistance;Sit to/from stand  Functional mobility during ADLs: Minimal assistance;Rolling walker       Vision         Perception     Praxis      Pertinent Vitals/Pain Pain Assessment: Faces Faces Pain Scale: Hurts little more Pain Location: R wrist Pain Descriptors / Indicators:  Aching;Discomfort;Grimacing;Guarding("it's killing me") Pain Intervention(s): Limited activity within patient's tolerance;Monitored during session;Repositioned     Hand Dominance Right   Extremity/Trunk Assessment Upper Extremity Assessment Upper Extremity Assessment: RUE deficits/detail RUE Deficits / Details: pt with limited wrist and digit ROM due to pain (suspected gout); reports improvements in pain level with wrist brace RUE: Unable to fully assess due to pain   Lower Extremity Assessment Lower Extremity Assessment: Defer to PT evaluation RLE Deficits / Details: does well with weight shifting onto R LE; no buckling with gait RLE Sensation: WNL   Cervical / Trunk Assessment Cervical / Trunk Assessment: Normal   Communication Communication Communication: No difficulties   Cognition Arousal/Alertness: Awake/alert Behavior During Therapy: WFL for tasks assessed/performed Overall Cognitive Status: Within Functional Limits for tasks assessed                                     General Comments       Exercises     Shoulder Instructions      Home Living Family/patient expects to be discharged to:: Private residence Living Arrangements: Other relatives(grandson/daughter) Available Help at Discharge: Family;Available PRN/intermittently Type of Home: Apartment Home Access: Level entry     Home Layout: One level     Bathroom Shower/Tub: Teacher, early years/pre: Standard Bathroom Accessibility: Yes   Home Equipment: Cane - single point;Walker - 4 wheels;Bedside commode          Prior Functioning/Environment          Comments: prior to TKA was independent; since recent admission required some assist for ADLs/mobility        OT Problem List: Decreased strength;Decreased range of motion;Decreased activity tolerance;Impaired balance (sitting and/or standing);Pain      OT Treatment/Interventions: Self-care/ADL training;Therapeutic  exercise;Neuromuscular education;Energy conservation;DME and/or AE instruction;Therapeutic activities;Patient/family education;Balance training    OT Goals(Current goals can be found in the care plan section) Acute Rehab OT Goals Patient Stated Goal: return home tomorrow OT Goal Formulation: With patient Time For Goal Achievement: 06/04/18 Potential to Achieve Goals: Good  OT Frequency: Min 2X/week   Barriers to D/C:            Co-evaluation PT/OT/SLP Co-Evaluation/Treatment: Yes Reason for Co-Treatment: Complexity of the patient's impairments (multi-system involvement);For patient/therapist safety;To address functional/ADL transfers PT goals addressed during session: Mobility/safety with mobility;Balance;Proper use of DME OT goals addressed during session: ADL's and self-care;Proper use of Adaptive equipment and DME      AM-PAC OT "6 Clicks" Daily Activity     Outcome Measure Help from another person eating meals?: None Help from another person taking care of personal grooming?: A Little Help from another person toileting, which includes using toliet, bedpan, or urinal?: A Little Help from another person bathing (including washing, rinsing, drying)?: A Lot Help from another person to put on and taking off regular upper body clothing?: None Help from another person to put on and taking off regular lower body clothing?: A Lot 6 Click Score: 18   End of Session Equipment Utilized During Treatment: Gait belt;Rolling walker Nurse Communication: Mobility status  Activity Tolerance: Patient tolerated  treatment well Patient left: in chair;with call bell/phone within reach;with chair alarm set  OT Visit Diagnosis: Other abnormalities of gait and mobility (R26.89);Pain Pain - Right/Left: Right Pain - part of body: Hand(wrist)                Time: 8138-8719 OT Time Calculation (min): 27 min Charges:  OT General Charges $OT Visit: 1 Visit OT Evaluation $OT Eval Moderate Complexity:  Elias-Fela Solis, OT [employer] Pager [phone] Office [phone]   Raymondo Band 05/21/2018, 11:46 AM

## 2018-05-22 ENCOUNTER — Encounter (HOSPITAL_COMMUNITY): Payer: Self-pay | Admitting: General Practice

## 2018-05-22 DIAGNOSIS — E119 Type 2 diabetes mellitus without complications: Secondary | ICD-10-CM

## 2018-05-22 LAB — GLUCOSE, CAPILLARY
GLUCOSE-CAPILLARY: 254 mg/dL — AB (ref 70–99)
Glucose-Capillary: 224 mg/dL — ABNORMAL HIGH (ref 70–99)
Glucose-Capillary: 253 mg/dL — ABNORMAL HIGH (ref 70–99)
Glucose-Capillary: 267 mg/dL — ABNORMAL HIGH (ref 70–99)

## 2018-05-22 LAB — HEMOGLOBIN A1C
Hgb A1c MFr Bld: 6.9 % — ABNORMAL HIGH (ref 4.8–5.6)
Mean Plasma Glucose: 151 mg/dL

## 2018-05-22 MED ORDER — WHITE PETROLATUM EX OINT
TOPICAL_OINTMENT | CUTANEOUS | Status: AC
  Filled 2018-05-22: qty 28.35

## 2018-05-22 MED ORDER — PREDNISONE 20 MG PO TABS
60.0000 mg | ORAL_TABLET | Freq: Two times a day (BID) | ORAL | Status: DC
  Administered 2018-05-22 – 2018-05-23 (×2): 60 mg via ORAL
  Filled 2018-05-22 (×2): qty 3

## 2018-05-22 MED ORDER — SODIUM CHLORIDE 0.9 % IV SOLN: 1000.0000 mL | INTRAVENOUS | Status: DC

## 2018-05-22 NOTE — Plan of Care (Signed)
  Problem: Clinical Measurements: Goal: Diagnostic test results will improve Outcome: Progressing   Problem: Fluid Volume: Goal: Hemodynamic stability will improve Outcome: Progressing   Problem: Education: Goal: Knowledge of General Education information will improve Description Including pain rating scale, medication(s)/side effects and non-pharmacologic comfort measures Outcome: Progressing   Problem: Respiratory: Goal: Ability to maintain adequate ventilation will improve Outcome: Progressing

## 2018-05-22 NOTE — Care Plan (Signed)
Ortho Bundle Case Management Note  Patient Details  Name: Danielle [name] MRN: [id number] Date of Birth: 10/22/1945     I have reviewed chart and noted recommendation. OT eval added to [facility] orders. [facility] will resume care. Patient is still set to start [place] on 05/27/18 at 1000 at [facility] across from Dr. Luanna Cole office.                 DME Arranged:    DME Agency:     HH Arranged:  PT, OT [place] Agency:  [facility] (formerly [facility])  Additional Comments: Please contact me with any questions of if this plan should need to change.  [name],  [place] [facility]  [phone] 05/22/2018, 1:06 PM

## 2018-05-22 NOTE — Care Plan (Signed)
Patient is known to me from the office. She is active ortho bundle. She was discharged to home on 12/21 with [place] [facility] [place] aide from [facility]. She has all needed equipment at home. [employer] denied SNF placement at that time of discharge. Her preference was [facility]. Will need to get approval for SNF if that is her desire with this admission although she is doing well enough with therapy that it may get denied again. She needs aggressive therapy while in the hospital to work on strength and mobility.  Will assist with plan.    [name], [place]

## 2018-05-22 NOTE — Progress Notes (Signed)
Physical Therapy Treatment Patient Details Name: Danielle [name] MRN: [id number] DOB: June 07, 1945 Today's Date: 05/22/2018    History of Present Illness Patient is a 72 y/o female presenting to the ED on 05/20/18 with primary complaints of confusion, fever. 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 Associated R knee pain as well as R wrist pain. CXR No active pulmonary disease. 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 Wrist x rays show Diffuse soft tissue swelling. Negative for fracture or erosion. Mild degenerative change. 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    PT Comments    Pt performed gait training and functional mobility during session this pm.  Post gait training elevated R wrist and placed CP on for pain and edema management.  Pt presents with increased edema in R hand and proximal to R forearm.  Nursing informed.  Pt tolerated B LE exercises and is eager to return home with HHPT.    Follow Up Recommendations  Supervision for mobility/OOB;Home health PT     Equipment Recommendations  None recommended by PT    Recommendations for Other Services       Precautions / Restrictions Precautions Precautions: Fall Required Braces or Orthoses: Other Brace Other Brace: pt with R wrist brace for comfort/pain management Restrictions Weight Bearing Restrictions: Yes RUE Weight Bearing: Weight bearing as tolerated(pain limiting) RLE Weight Bearing: Weight bearing as tolerated    Mobility  Bed Mobility               General bed mobility comments: Pt in recliner on arrival  Transfers Overall transfer level: Needs assistance Equipment used: Rolling walker (2 wheeled) Transfers: Sit to/from Stand Sit to Stand: Min guard Stand pivot transfers: Min guard       General transfer comment: Cues for hand placement to and from seated  surface.    Ambulation/Gait Ambulation/Gait assistance: Min guard Gait Distance (Feet): 120 Feet Assistive device: Rolling walker (2 wheeled) Gait Pattern/deviations: Step-through pattern;Decreased stride length Gait velocity: decreased   General Gait Details: Continues to do well with RW.  Required cues for RW use and poor hand grip on R noted.     Stairs             Wheelchair Mobility    Modified Rankin (Stroke Patients Only)       Balance Overall balance assessment: Mild deficits observed, not formally tested                                          Cognition Arousal/Alertness: Awake/alert Behavior During Therapy: WFL for tasks assessed/performed Overall Cognitive Status: Within Functional Limits for tasks assessed                                 General Comments: tangential with conversation.       Exercises General Exercises - Lower Extremity Ankle Circles/Pumps: AROM;Both;20 reps;Supine Quad Sets: AROM;Both;20 reps;Supine Heel Slides: AROM;Both;20 reps;Supine Hip ABduction/ADduction: AROM;Both;20 reps;Supine Straight Leg Raises: AROM;Both;20 reps;Supine    General Comments        Pertinent Vitals/Pain Pain Assessment: 0-10 Pain Score: 5  Pain Location: R wrist Pain Descriptors / Indicators: Aching;Discomfort;Grimacing;Guarding Pain Intervention(s): Monitored during session;Repositioned;Ice applied    Home Living  Prior Function            PT Goals (current goals can now be found in the care plan section) Acute Rehab PT Goals Patient Stated Goal: hopeful for home today Potential to Achieve Goals: Good Progress towards PT goals: Progressing toward goals    Frequency    Min 3X/week      PT Plan Current plan remains appropriate    Co-evaluation              AM-PAC PT "6 Clicks" Mobility   Outcome Measure  Help needed turning from your back to your side while in a  flat bed without using bedrails?: A Little Help needed moving from lying on your back to sitting on the side of a flat bed without using bedrails?: A Little Help needed moving to and from a bed to a chair (including a wheelchair)?: A Little Help needed standing up from a chair using your arms (e.g., wheelchair or bedside chair)?: A Little Help needed to walk in hospital room?: A Little Help needed climbing 3-5 steps with a railing? : A Little 6 Click Score: 18    End of Session Equipment Utilized During Treatment: Gait belt Activity Tolerance: Patient tolerated treatment well Patient left: in chair;with call bell/phone within reach;with chair alarm set Nurse Communication: Mobility status PT Visit Diagnosis: Unsteadiness on feet (R26.81);Other abnormalities of gait and mobility (R26.89)     Time: 6606-0045 PT Time Calculation (min) (ACUTE ONLY): 35 min  Charges:  $Gait Training: 8-22 mins $Therapeutic Exercise: 8-22 mins                     Governor Rooks, PTA Acute Rehabilitation Services Pager [phone] Office [phone]     Lawrence Roldan Eli Hose 05/22/2018, 3:49 PM

## 2018-05-22 NOTE — Progress Notes (Signed)
Occupational Therapy Treatment Patient Details Name: Danielle [name] MRN: [id number] DOB: 1946/04/04 Today's Date: 05/22/2018    History of present illness Patient is a 72 y/o female presenting to the ED on 05/20/18 with primary complaints of confusion, fever. 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 Associated R knee pain as well as R wrist pain. CXR No active pulmonary disease. 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 Wrist x rays show Diffuse soft tissue swelling. Negative for fracture or erosion. Mild degenerative change. 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   OT comments  Pt progressing towards OT goals; presents up in bathroom upon arrival to room. Pt performing room level mobility and standing grooming ADL with overall minguard assist. Continues to report increased pain in RUE, though is able to utilize for completion of fine motor grooming tasks given increased time/effort. Pt motivated to work with therapy and to return home. Feel POC remains appropriate at this time. Will continue to follow acutely to progress pt towards established OT Goals.   Follow Up Recommendations  Supervision/Assistance - 24 hour;Follow surgeon's recommendation for DC plan and follow-up therapies;Home health OT    Equipment Recommendations  None recommended by OT(pt's DME needs are met)          Precautions / Restrictions Precautions Precautions: Fall Required Braces or Orthoses: Other Brace Other Brace: pt with R wrist brace for comfort/pain management Restrictions Weight Bearing Restrictions: Yes RUE Weight Bearing: Weight bearing as tolerated(pain limiting) RLE Weight Bearing: Weight bearing as tolerated       Mobility Bed Mobility               General bed mobility comments: OOB in bathroom  Transfers Overall transfer level: Needs  assistance Equipment used: Rolling walker (2 wheeled) Transfers: Sit to/from Stand Sit to Stand: Min guard         General transfer comment: pt demonstrates safe hand placement    Balance Overall balance assessment: Mild deficits observed, not formally tested                                         ADL either performed or assessed with clinical judgement   ADL Overall ADL's : Needs assistance/impaired     Grooming: Min guard;Standing;Wash/dry face;Wash/dry hands;Oral care Grooming Details (indicate cue type and reason): standing at sink >5 min for tasks; pt intermittently using forearm support on sink for increased stability; pt continues to attempt using R hand for fine motor portions of tasks, is able to do so with increased time/effort                 Toilet Transfer: Min Marine scientist Details (indicate cue type and reason): pt up in bathroom upon arrival to room Toileting- Water quality scientist and Hygiene: Min guard;Sit to/from stand;Sitting/lateral lean Toileting - Clothing Manipulation Details (indicate cue type and reason): pt performing peri-care after voiding bladder without assist     Functional mobility during ADLs: Minimal assistance;Min guard;Rolling walker       Vision       Perception     Praxis      Cognition Arousal/Alertness: Awake/alert Behavior During Therapy: WFL for tasks assessed/performed Overall Cognitive Status: Within Functional Limits for tasks assessed  Exercises     Shoulder Instructions       General Comments      Pertinent Vitals/ Pain       Pain Assessment: Faces Faces Pain Scale: Hurts little more Pain Location: R wrist Pain Descriptors / Indicators: Aching;Discomfort;Grimacing;Guarding Pain Intervention(s): Monitored during session;Repositioned  Home Living                                           Prior Functioning/Environment              Frequency  Min 2X/week        Progress Toward Goals  OT Goals(current goals can now be found in the care plan section)     Acute Rehab OT Goals Patient Stated Goal: hopeful for home today OT Goal Formulation: With patient Time For Goal Achievement: 06/04/18 Potential to Achieve Goals: Good  Plan Discharge plan remains appropriate    Co-evaluation                 AM-PAC OT "6 Clicks" Daily Activity     Outcome Measure   Help from another person eating meals?: None Help from another person taking care of personal grooming?: A Little Help from another person toileting, which includes using toliet, bedpan, or urinal?: A Little Help from another person bathing (including washing, rinsing, drying)?: A Lot Help from another person to put on and taking off regular upper body clothing?: None Help from another person to put on and taking off regular lower body clothing?: A Lot 6 Click Score: 18    End of Session Equipment Utilized During Treatment: Rolling walker  OT Visit Diagnosis: Other abnormalities of gait and mobility (R26.89);Pain Pain - Right/Left: Right Pain - part of body: Hand(wrist)   Activity Tolerance Patient tolerated treatment well   Patient Left in chair;with call bell/phone within reach;with chair alarm set   Nurse Communication Mobility status        Time: 0174-9449 OT Time Calculation (min): 19 min  Charges: OT General Charges $OT Visit: 1 Visit OT Treatments $Self Care/Home Management : 8-22 mins  Lou Cal, [place] Pager [phone] Office [phone]    Raymondo Band 05/22/2018, 10:08 AM

## 2018-05-22 NOTE — Social Work (Addendum)
CSW acknowledging consult for "home health aide evaluation." Home Health managed by RN CM.  Therapy recommendations currently for HHPT. CSW signing off. Please consult if any additional needs arise.  Alexander Mt, [place] Work [phone]

## 2018-05-22 NOTE — Progress Notes (Signed)
Patient ID: Danielle [name], female   DOB: May 29, 1945, 72 y.o.   MRN: [id number]     Subjective:  Patient reports pain as mild.  Knee is fine and the wrist is improving.  Patient would like a social work consult  Objective:   VITALS:   Vitals:   05/21/18 1633 05/21/18 1930 05/21/18 2352 05/22/18 0343  BP: (!) 148/67 (!) 144/69 (!) 118/98 (!) 141/66  Pulse: 63 66 (!) 58 60  Resp: 18 18 18 18   Temp: 97.9 F (36.6 C) 98.4 F (36.9 C) 99.2 F (37.3 C) 98 F (36.7 C)  TempSrc: Oral Oral Oral Oral  SpO2: 100% 98% 99% 99%  Weight:      Height:        ABD soft Sensation intact distally Dorsiflexion/Plantar flexion intact Incision: dressing C/D/I and no drainage at the right knee Right wrist splint in place  Lab Results  Component Value Date   WBC 11.3 (H) 05/21/2018   HGB 7.7 (L) 05/21/2018   HCT 24.9 (L) 05/21/2018   MCV 66.6 (L) 05/21/2018   PLT 260 05/21/2018   BMET    Component Value Date/Time   NA 140 05/21/2018 0346   K 4.7 05/21/2018 0346   CL 101 05/21/2018 0346   CO2 31 05/21/2018 0346   GLUCOSE 330 (H) 05/21/2018 0346   BUN 29 (H) 05/21/2018 0346   CREATININE 1.51 (H) 05/21/2018 0346   CALCIUM 8.8 (L) 05/21/2018 0346   GFRNONAA 34 (L) 05/21/2018 0346   GFRAA 40 (L) 05/21/2018 0346     Assessment/Plan:     Principal Problem:   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

## 2018-05-22 NOTE — Progress Notes (Signed)
Inpatient Diabetes Program Recommendations  AACE/ADA: New Consensus Statement on Inpatient Glycemic Control (2015)  Target Ranges:  Prepandial:   less than 140 mg/dL      Peak postprandial:   less than 180 mg/dL (1-2 hours)      Critically ill patients:  140 - 180 mg/dL   Lab Results  Component Value Date   GLUCAP 253 (H) 05/22/2018   HGBA1C 6.9 (H) 05/20/2018    Review of Glycemic Control Results for Danielle [name], Danielle [name] (MRN [id number]) as of 05/22/2018 13:12  Ref. Range 05/21/2018 11:22 05/21/2018 16:39 05/21/2018 21:11 05/22/2018 06:11 05/22/2018 11:37  Glucose-Capillary Latest Ref Range: 70 - 99 mg/dL 341 (H) 269 (H) 300 (H) 224 (H) 253 (H)   Diabetes history: DM 2 Outpatient Diabetes medications: Metformin 500 mg bid, Jardiance 10 mg daily, Lantus 30 units bid Current orders for Inpatient glycemic control: Invokana 100 mg with breakfast, Novolog resistant tid with meals and HS, Lantus 30 units bid Inpatient Diabetes Program Recommendations:   While on steroids, consider increasing Lantus to 35 units bid.  Also may consider adding Novolog meal coverage 4 units tid with meals (hold if patient eats less than 50%).   Thanks,  Adah Perl, RN, BC-ADM Inpatient Diabetes Coordinator Pager [phone] (8a-5p)

## 2018-05-22 NOTE — Progress Notes (Signed)
PROGRESS NOTE    Danielle [name]  XBM:[id number] DOB: March 26, 1946 DOA: 05/20/2018 PCP: Seward Carol, MD   Brief Narrative:  Danielle [name] is a 72 y.o. 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  Found to have acute right wrist gouty arthritis with crystals noted on aspirate.  Started on steroids.   Assessment & Plan:   Acute gout involving right wrist Patient underwent arthrocentesis.  Fluid analysis showed crystals.  High WBC most likely due to acute inflammation.  Cultures negative so far.  Patient was started on steroids.  Continues to have pain in her right wrist. Transition to oral steroids which should be okay to use.  She is on a PPI.  Seen by PT and OT.  Right wrist is in a splint.  Check uric acid level.  Patient previously had been on allopurinol which she stopped due to intolerance.  Her allergy list includes Uloric.  Patient might benefit from being referred to rheumatology by her primary care physician.    Acute metabolic encephalopathy Most likely due to fever and the acute inflammation.  Sepsis ruled out.  Patient's mental status is now back to normal.  Sepsis, ruled out There was initially concern for sepsis due to fever and altered mental status.  However all this was thought to be due to acute gout.  Sepsis has been ruled out.  Cultures are negative so far.  She was taken off of antibiotics.  Chronic systolic and diastolic CHF Appears to be well compensated.  Continue with diuretics.  Echocardiogram from August 2019 showed EF to be 45 to 50% with grade 2 diastolic dysfunction.  History of paroxysmal atrial fibrillation Recently seen by cardiology and was started on Eliquis.  Her aspirin and Brilinta was discontinued.  Essential  hypertension Stable.  Continue to monitor blood pressures  Diabetes mellitus type 2, insulin-dependent Rising glucose level due to steroids.  Continue SSI.  Continue Lantus.  Anemia of chronic disease Baseline hemoglobin between 7-9.  Hemoglobin is stable.  Patient is a Sales promotion account executive Witness her preferences not to be transfused.  Will minimize blood draws.  Chronic kidney disease stage IV Stable.  Appears to be at baseline.  Recent right total knee arthroplasty Appears to be stable.  Orthopedics has been following.  Home health.   DVT prophylaxis: Eliquis.  Code Status: full code.  Family Communication:  Discussed with patient and her daughter.   Disposition Plan:  Home when ready for discharge.  Home health.  Possibly tomorrow.   Subjective: Patient states that she continues to have pain in her right wrist.  Denies any shortness of breath.  No chest pain.  Objective: Vitals:   05/21/18 1930 05/21/18 2352 05/22/18 0343 05/22/18 0754  BP: (!) 144/69 (!) 118/98 (!) 141/66 (!) 151/73  Pulse: 66 (!) 58 60 67  Resp: 18 18 18 18   Temp: 98.4 F (36.9 C) 99.2 F (37.3 C) 98 F (36.7 C) 98.7 F (37.1 C)  TempSrc: Oral Oral Oral Oral  SpO2: 98% 99% 99% 99%  Weight:      Height:        Intake/Output Summary (Last 24 hours) at 05/22/2018 1006 Last data filed at 05/21/2018 1900 Gross per 24 hour  Intake 720 ml  Output -  Net 720 ml   Filed Weights   05/20/18  1315  Weight: 91.6 kg    Examination:  Awake alert.  In no distress Lungs are clear to auscultation bilaterally.  Normal effort. S1-S2 is normal regular.  Systolic murmur appreciated over the precordium. Abdomen is soft.  Nontender nondistended.  Bowel sounds present normal.  No masses organomegaly. Right wrist is in a splint.  She is able to move her fingers. No focal neurological deficits.    Data Reviewed: I have personally reviewed following labs and imaging studies  CBC: Recent Labs  Lab 05/16/18 0527  05/20/18 1321 05/21/18 0346  WBC 9.1 14.3* 11.3*  NEUTROABS  --  10.6*  --   HGB 8.0* 7.9* 7.7*  HCT 26.5* 26.9* 24.9*  MCV 68.8* 67.3* 66.6*  PLT 193 269 619   Basic Metabolic Panel: Recent Labs  Lab 05/16/18 0527 05/17/18 0518 05/20/18 1321 05/21/18 0346  NA 138 140 144 140  K 4.2 4.2 4.8 4.7  CL 97* 99 103 101  CO2 26 26 29 31   GLUCOSE 176* 118* 220* 330*  BUN 48* 45* 27* 29*  CREATININE 1.81* 1.61* 1.47* 1.51*  CALCIUM 9.3 9.2 9.4 8.8*   GFR: Estimated Creatinine Clearance: 37.5 mL/min (A) (by C-G formula based on SCr of 1.51 mg/dL (H)). Liver Function Tests: Recent Labs  Lab 05/20/18 1321  AST 68*  ALT 41  ALKPHOS 64  BILITOT 1.1  PROT 7.3  ALBUMIN 3.2*   Recent Labs  Lab 05/20/18 1321  LIPASE 22   Recent Labs  Lab 05/20/18 1351  AMMONIA 32   HbA1C: Recent Labs    05/20/18 1815  HGBA1C 6.9*   CBG: Recent Labs  Lab 05/21/18 0608 05/21/18 1122 05/21/18 1639 05/21/18 2111 05/22/18 0611  GLUCAP 289* 341* 269* 300* 224*   Anemia Panel: Recent Labs    05/21/18 0346  VITAMINB12 526  FOLATE 45.0  FERRITIN 823*  TIBC 178*  IRON 15*  RETICCTPCT 1.1   Sepsis Labs: Recent Labs  Lab 05/20/18 1337  LATICACIDVEN 1.75    Recent Results (from the past 240 hour(s))  Culture, blood (routine x 2)     Status: None (Preliminary result)   Collection Time: 05/20/18  1:21 PM  Result Value Ref Range Status   Specimen Description BLOOD LEFT ANTECUBITAL  Final   Special Requests   Final    BOTTLES DRAWN AEROBIC AND ANAEROBIC Blood Culture adequate volume   Culture   Final    NO GROWTH < 24 HOURS Performed at [facility], [place] [address], [place], [place] [address]    Report Status PENDING  Incomplete  Body fluid culture     Status: None (Preliminary result)   Collection Time: 05/20/18  4:02 PM  Result Value Ref Range Status   Specimen Description FLUID SYNOVIAL RIGHT WRIST  Final   Special Requests Normal  Final   Gram Stain   Final     ABUNDANT WBC PRESENT,BOTH PMN AND MONONUCLEAR NO ORGANISMS SEEN    Culture   Final    NO GROWTH < 24 HOURS Performed at [facility], [place] [address] [place], [place] [address]    Report Status PENDING  Incomplete  Culture, blood (routine x 2)     Status: None (Preliminary result)   Collection Time: 05/20/18  4:43 PM  Result Value Ref Range Status   Specimen Description SITE NOT SPECIFIED  Final   Special Requests   Final    BOTTLES DRAWN AEROBIC AND ANAEROBIC Blood Culture adequate volume  Culture   Final    NO GROWTH < 24 HOURS Performed at [facility], [place] [address], [place], [place] [address]    Report Status PENDING  Incomplete         Radiology Studies: Dg Wrist Complete Right  Result Date: 05/20/2018 CLINICAL DATA:  Gout.  Wrist pain.  No injury EXAM: RIGHT WRIST - COMPLETE 3+ VIEW COMPARISON:  None. FINDINGS: Mild degenerative change of the base of the thumb and in the radiocarpal joint. No erosion or fracture. Diffuse soft tissue swelling around the wrist. Mild arterial calcification. IMPRESSION: Diffuse soft tissue swelling. Negative for fracture or erosion. Mild degenerative change. Electronically Signed   By: Franchot Gallo M.D.   On: 05/20/2018 16:52   Ct Head Wo Contrast  Result Date: 05/20/2018 CLINICAL DATA:  Generalized weakness and altered mental status beginning today. EXAM: CT HEAD WITHOUT CONTRAST TECHNIQUE: Contiguous axial images were obtained from the base of the skull through the vertex without intravenous contrast. COMPARISON:  None. FINDINGS: Brain: No evidence of acute infarction, hemorrhage, hydrocephalus, extra-axial collection, or mass lesion/mass effect. Mild diffuse cerebral atrophy and chronic small vessel disease. 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 Vascular:  No hyperdense vessel or other acute findings. Skull: No evidence of fracture or other significant bone abnormality. Sinuses/Orbits:  No acute findings. Other:  None. IMPRESSION: No acute intracranial abnormality. 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 Electronically Signed   By: Earle Gell M.D.   On: 05/20/2018 16:28   Dg Chest Port 1 View  Result Date: 05/20/2018 CLINICAL DATA:  Mental status change, generalized weakness and new onset of incontinence. EXAM: PORTABLE CHEST 1 VIEW COMPARISON:  02/03/2017 FINDINGS: Stable cardiomegaly with moderate aortic atherosclerosis. No acute pulmonary consolidation nor overt pulmonary edema. No effusion or pneumothorax. Degenerative changes are present about the dorsal spine and about the included shoulders. IMPRESSION: Stable cardiomegaly with moderate aortic atherosclerosis. No active pulmonary disease. Electronically Signed   By: Ashley Royalty M.D.   On: 05/20/2018 14:37    Scheduled Meds: . white petrolatum      . allopurinol  100 mg Oral Daily  . apixaban  5 mg Oral BID  . atorvastatin  80 mg Oral QHS  . calcium citrate  1 tablet Oral BID  . canagliflozin  100 mg Oral QAC breakfast  . carvedilol  25 mg Oral BID WC  . ferrous sulfate  325 mg Oral BID WC  . gabapentin  300 mg Oral Daily  . insulin aspart  0-20 Units Subcutaneous TID WC  . insulin aspart  0-5 Units Subcutaneous QHS  . insulin glargine  30 Units Subcutaneous BID  . isosorbide mononitrate  30 mg Oral Daily  . latanoprost  1 drop Both Eyes QHS  . levothyroxine  75 mcg Oral QAC breakfast  . losartan  50 mg Oral Daily  . multivitamin with minerals   Oral Daily  . pantoprazole  40 mg Oral Daily  . potassium chloride  20 mEq Oral Daily  . predniSONE  60 mg Oral BID WC  . spironolactone  25 mg Oral QODAY  . torsemide  20 mg Oral BID   Continuous Infusions: . sodium chloride       LOS: 1 day    Bonnielee Haff, MD [facility] Pager [phone]  If 7PM-7AM, please contact night-coverage [email or web address] Password [email or web address] 05/22/2018, 10:06 AM

## 2018-05-23 ENCOUNTER — Encounter: Payer: Self-pay | Admitting: Cardiology

## 2018-05-23 ENCOUNTER — Other Ambulatory Visit (HOSPITAL_COMMUNITY): Payer: Self-pay

## 2018-05-23 LAB — GLUCOSE, CAPILLARY
GLUCOSE-CAPILLARY: 191 mg/dL — AB (ref 70–99)
Glucose-Capillary: 217 mg/dL — ABNORMAL HIGH (ref 70–99)

## 2018-05-23 LAB — URIC ACID: Uric Acid, Serum: 6.8 mg/dL (ref 2.5–7.1)

## 2018-05-23 MED ORDER — PREDNISONE 20 MG PO TABS: ORAL_TABLET | ORAL | 0 refills | Status: DC

## 2018-05-23 NOTE — Consult Note (Signed)
   [facility] CM Inpatient Consult   05/23/2018  Danielle [name] 1945/06/16 [id number]    Patient screened for potential [facility] Care Management program services due to high unplanned readmission risk score.  Spoke with Mrs. [name] at bedside to discuss [place] Management services. Mrs. [name] denies having any transportation, medication, or disease management concerns. She follows up with her physicians regularly.  Provided [facility] Care Management brochure with contact information should she need [name] services in the future.  Mrs. [name] expressed appreciation of the visit.   Will send notification to inpatient RNCM that [name] services were pleasantly declined.    Marthenia Rolling, MSN-Ed, RN,BSN [facility] Liaison [phone]

## 2018-05-23 NOTE — Progress Notes (Signed)
Physical Therapy Treatment Patient Details Name: Danielle [name] MRN: [id number] DOB: 12-Jul-1945 Today's Date: 05/23/2018    History of Present Illness Patient is a 72 y/o female presenting to the ED on 05/20/18 with primary complaints of confusion, fever. 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 Associated R knee pain as well as R wrist pain. CXR No active pulmonary disease. 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 Wrist x rays show Diffuse soft tissue swelling. Negative for fracture or erosion. Mild degenerative change. 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    PT Comments    Pt performed gait training and functional mobility, she continues to present with L hand weakness and poorly fitting wrist splint.  Pt with edema proximal and distal to splint.  Pt would benefit from skilled rehab in home setting for transition to home in safe manner.  Pt continues to be aware of her deficits and feels she can manage in the home with HHPT.  Plan next session for continued mobility and strengthening.  Informed OT to follow up to assess RUE strength and function.    Follow Up Recommendations  Supervision for mobility/OOB;Home health PT     Equipment Recommendations  None recommended by PT    Recommendations for Other Services OT consult(as ordered)     Precautions / Restrictions Precautions Precautions: Fall Required Braces or Orthoses: Other Brace Other Brace: pt with R wrist brace for comfort/pain management Restrictions Weight Bearing Restrictions: Yes RUE Weight Bearing: Weight bearing as tolerated(pain limiting) RLE Weight Bearing: Weight bearing as tolerated(from TKA)    Mobility  Bed Mobility               General bed mobility comments: Pt in recliner on arrival  Transfers Overall transfer level: Needs assistance Equipment used:  Rolling walker (2 wheeled) Transfers: Sit to/from Stand Sit to Stand: Supervision         General transfer comment: Cues for hand placement to and from seated surface.  Pt limited in RUE use.    Ambulation/Gait Ambulation/Gait assistance: Min guard Gait Distance (Feet): 180 Feet Assistive device: Rolling walker (2 wheeled) Gait Pattern/deviations: Step-through pattern;Decreased stride length;Drifts right/left     General Gait Details: Pt has difficulty gripping R side of RW.  R wrist splint donned for patient comfort.  Pt is slow and guarded and drifting to the R due to poor ability to stabilize and maintain path of RW.     Stairs             Wheelchair Mobility    Modified Rankin (Stroke Patients Only)       Balance Overall balance assessment: Mild deficits observed, not formally tested                                          Cognition Arousal/Alertness: Awake/alert Behavior During Therapy: WFL for tasks assessed/performed Overall Cognitive Status: Within Functional Limits for tasks assessed                                 General Comments: tangential with conversation.       Exercises General Exercises - Lower Extremity Ankle Circles/Pumps: AROM;Both;20 reps;Supine Quad Sets: AROM;Both;Supine;10 reps Long Arc Quad: AROM;Both;Seated;10 reps Heel  Slides: AROM;Both;Supine;10 reps Hip ABduction/ADduction: AROM;Both;Supine;10 reps Straight Leg Raises: AROM;Both;Supine;10 reps Hip Flexion/Marching: AROM;Both;10 reps;Seated    General Comments        Pertinent Vitals/Pain Pain Assessment: 0-10 Pain Score: 7  Pain Location: R wrist Pain Descriptors / Indicators: Aching;Discomfort;Grimacing;Guarding Pain Intervention(s): Monitored during session;Repositioned    Home Living                      Prior Function            PT Goals (current goals can now be found in the care plan section) Acute Rehab PT  Goals Patient Stated Goal: hopeful for home today Potential to Achieve Goals: Good Progress towards PT goals: Progressing toward goals    Frequency    Min 3X/week      PT Plan Current plan remains appropriate    Co-evaluation              AM-PAC PT "6 Clicks" Mobility   Outcome Measure  Help needed turning from your back to your side while in a flat bed without using bedrails?: A Little Help needed moving from lying on your back to sitting on the side of a flat bed without using bedrails?: A Little Help needed moving to and from a bed to a chair (including a wheelchair)?: A Little Help needed standing up from a chair using your arms (e.g., wheelchair or bedside chair)?: A Little Help needed to walk in hospital room?: A Little Help needed climbing 3-5 steps with a railing? : A Little 6 Click Score: 18    End of Session Equipment Utilized During Treatment: Gait belt Activity Tolerance: Patient tolerated treatment well Patient left: in chair;with call bell/phone within reach;with chair alarm set Nurse Communication: Mobility status PT Visit Diagnosis: Unsteadiness on feet (R26.81);Other abnormalities of gait and mobility (R26.89)     Time: 5409-8119 PT Time Calculation (min) (ACUTE ONLY): 27 min  Charges:  $Gait Training: 8-22 mins $Therapeutic Activity: 8-22 mins                     Governor Rooks, PTA Acute Rehabilitation Services Pager [phone] Office [phone]     Trennon Torbeck Eli Hose 05/23/2018, 9:55 AM

## 2018-05-23 NOTE — Care Management Note (Signed)
Case Management Note  Patient Details  Name: Jo Booze MRN: [id number] Date of Birth: 12-02-1945  Subjective/Objective:    Pt admitted with acute gout and renal impairment. She is from home with her grandson.  DME: 3 in 1 Denies issues obtaining her medications.  Denies issues with transportation.                Action/Plan: Plan is for patient to d/c home with resumption of [facility] services. CM met with the patient and she would like to continue with [facility]. Tiffany notified and will follow. Pt has transport home when medically ready.   Expected Discharge Date:  05/23/18               Expected Discharge Plan:  [place]  In-House Referral:     Discharge planning Services  CM Consult  Post Acute Care Choice:  Home Health Choice offered to:  Patient  DME Arranged:    DME Agency:     HH Arranged:  PT, OT [place] Agency:  [facility] (formerly [employer])  Status of Service:  Completed, signed off  If discussed at [employer], dates discussed:    Additional Comments:  Pollie Friar, RN 05/23/2018, 2:34 PM

## 2018-05-23 NOTE — Progress Notes (Signed)
IV discontinued without complication. Pt education and paper prescription for Prednisone provided to patient and daughter Varney Biles. Pt. Discharged from facility via wheelchair.

## 2018-05-23 NOTE — Discharge Instructions (Signed)

## 2018-05-23 NOTE — Progress Notes (Signed)
I went to Mr Acuna's home today to refill her pillboxes before she arrived home from her stay in the hospital. She ran out of Jardiance and potassium but she was left written instructions on where they go when she gets them from the pharmacy. Also ordered were spironolactone and losartan. All medications will be ready for pick up this evening after 20:00. I will follow up with her early next week.

## 2018-05-23 NOTE — Discharge Summary (Signed)
[facility]  Physician Discharge Summary   Patient ID: Danielle [name] MRN: [id number] DOB/AGE: [birth year] 72 y.o.  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 Patient told to talk to her primary care provider about further management of diabetes.  She might benefit from a higher dose of allopurinol.  Patient mentions some issues with taking this medication so would prefer PCP address this. 2. Follow-up with orthopedics 3. Home health has been ordered   DISCHARGE CONDITION: fair  Diet recommendation: Modified carbohydrate  Filed Weights   05/20/18 1315  Weight: 91.6 kg    INITIAL HISTORY: Danielle [name] a 72 y.o.femalewith medical history significant ofchronic diastolic and systolic heart failure type 2 DM, obesity, OSA, hypertension, hypothyroidism, recently underwent right TKA by Dr Mardelle Matte, was discharged home, brought in by her daughter for altered metal status, and fever.  Found to have acute right wrist gouty arthritis with crystals noted on aspirate.  Started on steroids.  Consultations:  Orthopedics  Procedures:  Arthrocentesis of the right wrist   HOSPITAL COURSE:   Acute gout involving right wrist Patient underwent arthrocentesis.  Fluid analysis showed crystals.  High WBC most likely due to acute inflammation.  Cultures negative so far.  Patient was started on steroids.   Symptoms have improved.  Range of motion has improved.  She will be discharged on tapering doses of prednisone.  Uric acid level 6.8.  Patient actually noted  to be on allopurinol.  She did mention some issues with taking his medications, has experienced some itching.  Ideally she should be on a higher dose and uric acid level should ideally be less than 5.  Would prefer that PCP address this issue at follow-up.  Seen by PT and OT.    Acute metabolic encephalopathy Most likely due to fever and the acute inflammation.  Sepsis ruled out.  Patient's mental status is now back to normal.  Sepsis, ruled out There was initially concern for sepsis due to fever and altered mental status.  However all this was thought to be due to acute gout.  Sepsis has been ruled out.  Cultures are negative so far.  She was taken off of antibiotics.  Chronic systolic and diastolic CHF Appears to be well compensated. Echocardiogram from August 2019 showed EF to be 45 to 50% with grade 2 diastolic dysfunction.  Continue home medications.  History of paroxysmal atrial fibrillation Recently seen by cardiology and was started on Eliquis.  Her aspirin and Brilinta was discontinued.  She is on carvedilol.  Essential hypertension Stable.  Diabetes mellitus type 2, insulin-dependent Poor control of glucose levels due to steroids.  Continue with the Lantus.  Should improve as steroid is tapered down.  Anemia of chronic disease Baseline hemoglobin between 7-9.  Hemoglobin is stable.  Patient is a Sales promotion account executive Witness her preference is not to be transfused.   Chronic kidney disease stage IV Stable.  Appears to be at baseline.  Recent right total knee arthroplasty Appears to be stable.  Orthopedics has been following.  Home health.   Overall stable.  Okay for discharge home today.  Discussed with patient and her daughter.  Home health ordered.  PERTINENT LABS:  The results of significant diagnostics from this hospitalization (including imaging, microbiology, ancillary and laboratory) are listed below for reference.    Microbiology: Recent Results (from the past  240 hour(s))  Culture, blood (routine x 2)     Status: None (Preliminary result)   Collection Time: 05/20/18  1:21 PM  Result Value Ref Range Status   Specimen Description BLOOD LEFT ANTECUBITAL  Final   Special Requests   Final    BOTTLES DRAWN AEROBIC AND ANAEROBIC Blood Culture adequate volume   Culture   Final    NO GROWTH 3 DAYS Performed at [facility], [address], [place], [place] [address]    Report Status PENDING  Incomplete  Body fluid culture     Status: None (Preliminary result)   Collection Time: 05/20/18  4:02 PM  Result Value Ref Range Status   Specimen Description FLUID SYNOVIAL RIGHT WRIST  Final   Special Requests Normal  Final   Gram Stain   Final    ABUNDANT WBC PRESENT,BOTH PMN AND MONONUCLEAR NO ORGANISMS SEEN    Culture   Final    NO GROWTH 3 DAYS Performed at [facility], [place] [address], [place], [place] [address]    Report Status PENDING  Incomplete  Culture, blood (routine x 2)     Status: None (Preliminary result)   Collection Time: 05/20/18  4:43 PM  Result Value Ref Range Status   Specimen Description SITE NOT SPECIFIED  Final   Special Requests   Final    BOTTLES DRAWN AEROBIC AND ANAEROBIC Blood Culture adequate volume   Culture   Final    NO GROWTH 3 DAYS Performed at [facility], [address], [place], [place] [address]    Report Status PENDING  Incomplete     Labs: Basic Metabolic Panel: Recent Labs  Lab 05/17/18 0518 05/20/18 1321 05/21/18 0346  NA 140 144 140  K 4.2 4.8 4.7  CL 99 103 101  CO2 26 29 31   GLUCOSE 118* 220* 330*  BUN 45* 27* 29*  CREATININE 1.61* 1.47* 1.51*  CALCIUM 9.2 9.4 8.8*   Liver Function Tests: Recent Labs  Lab 05/20/18 1321  AST 68*  ALT 41  ALKPHOS 64  BILITOT 1.1  PROT 7.3  ALBUMIN 3.2*   Recent Labs  Lab 05/20/18 1321  LIPASE 22   Recent Labs  Lab 05/20/18 1351  AMMONIA 32   CBC: Recent Labs  Lab 05/20/18 1321 05/21/18 0346  WBC 14.3* 11.3*    NEUTROABS 10.6*  --   HGB 7.9* 7.7*  HCT 26.9* 24.9*  MCV 67.3* 66.6*  PLT 269 260    CBG: Recent Labs  Lab 05/22/18 1137 05/22/18 1653 05/22/18 2138 05/23/18 0614 05/23/18 1141  GLUCAP 253* 267* 254* 191* 217*     IMAGING STUDIES Dg Wrist Complete Right  Result Date: 05/20/2018 CLINICAL DATA:  Gout.  Wrist pain.  No injury EXAM: RIGHT WRIST - COMPLETE 3+ VIEW COMPARISON:  None. FINDINGS: Mild degenerative change of the base of the thumb and in the radiocarpal joint. No erosion or fracture. Diffuse soft tissue swelling around the wrist. Mild arterial calcification. IMPRESSION: Diffuse soft tissue swelling. Negative for fracture or erosion. Mild degenerative change. Electronically Signed   By: Franchot Gallo M.D.   On: 05/20/2018 16:52   Ct Head Wo Contrast  Result Date: 05/20/2018 CLINICAL DATA:  Generalized weakness and altered mental status beginning today. EXAM: CT HEAD WITHOUT CONTRAST  TECHNIQUE: Contiguous axial images were obtained from the base of the skull through the vertex without intravenous contrast. COMPARISON:  None. FINDINGS: Brain: No evidence of acute infarction, hemorrhage, hydrocephalus, extra-axial collection, or mass lesion/mass effect. Mild diffuse cerebral atrophy and chronic small vessel disease. 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 Vascular:  No hyperdense vessel or other acute findings. Skull: No evidence of fracture or other significant bone abnormality. Sinuses/Orbits:  No acute findings. Other: None. IMPRESSION: No acute intracranial abnormality. 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 Electronically Signed   By: Earle Gell M.D.   On: 05/20/2018 16:28   Dg Chest Port 1 View  Result Date: 05/20/2018 CLINICAL DATA:  Mental status change, generalized weakness and new onset of incontinence. EXAM: PORTABLE CHEST 1 VIEW COMPARISON:  02/03/2017 FINDINGS: Stable cardiomegaly with moderate aortic  atherosclerosis. No acute pulmonary consolidation nor overt pulmonary edema. No effusion or pneumothorax. Degenerative changes are present about the dorsal spine and about the included shoulders. IMPRESSION: Stable cardiomegaly with moderate aortic atherosclerosis. No active pulmonary disease. Electronically Signed   By: Ashley Royalty M.D.   On: 05/20/2018 14:37   Dg Knee Right Port  Result Date: 05/13/2018 CLINICAL DATA:  Postop day 0 RIGHT total knee arthroplasty due to severe osteoarthritis. EXAM: PORTABLE RIGHT KNEE - 1-2 VIEW COMPARISON:  06/02/2017. FINDINGS: Anatomic alignment post RIGHT total knee arthroplasty. No acute complicating features. Expected air-fluid level in the joint space. Popliteal artery atherosclerosis. IMPRESSION: Anatomic alignment post RIGHT total knee arthroplasty without acute complicating features. Electronically Signed   By: Evangeline Dakin M.D.   On: 05/13/2018 10:53    DISCHARGE EXAMINATION: Vitals:   05/22/18 2330 05/23/18 0349 05/23/18 0739 05/23/18 1144  BP: (!) 166/83 (!) 150/65 (!) 165/80 (!) 155/70  Pulse: 65 65 (!) 59 62  Resp: 16 18 18    Temp: 98.9 F (37.2 C) 98.5 F (36.9 C) 98.5 F (36.9 C) 98.2 F (36.8 C)  TempSrc: Oral Oral Oral   SpO2: 97% 100% 100% 100%  Weight:      Height:       General appearance: alert, cooperative, appears stated age and no distress Resp: clear to auscultation bilaterally Cardio: regular rate and rhythm, S1, S2 normal, no murmur, click, rub or gallop GI: soft, non-tender; bowel sounds normal; no masses,  no organomegaly Improved range of motion of the right wrist.  Good radial pulses.  DISPOSITION: Home  Discharge Instructions    Call MD for:  difficulty breathing, headache or visual disturbances   Complete by:  As directed    Call MD for:  extreme fatigue   Complete by:  As directed    Call MD for:  persistant dizziness or light-headedness   Complete by:  As directed    Call MD for:  persistant nausea and  vomiting   Complete by:  As directed    Call MD for:  severe uncontrolled pain   Complete by:  As directed    Call MD for:  temperature >100.4   Complete by:  As directed    Diet Carb Modified   Complete by:  As directed    Discharge instructions   Complete by:  As directed    Please be sure to follow-up with your primary care provider for further discussions of Gout management.  Follow-up with orthopedics.  Home health has been ordered.  You were cared for by a hospitalist during your hospital stay. If you have  any questions about your discharge medications or the care you received while you were in the hospital after you are discharged, you can call the unit and asked to speak with the hospitalist on call if the hospitalist that took care of you is not available. Once you are discharged, your primary care physician will handle any further medical issues. Please note that NO REFILLS for any discharge medications will be authorized once you are discharged, as it is imperative that you return to your primary care physician (or establish a relationship with a primary care physician if you do not have one) for your aftercare needs so that they can reassess your need for medications and monitor your lab values. If you do not have a primary care physician, you can call [phone] for a physician referral.   Increase activity slowly   Complete by:  As directed         Allergies as of 05/23/2018      Reactions   Other Other (See Comments)   Blood products   Sulfa Antibiotics Rash   Uloric [febuxostat] Rash      Medication List    TAKE these medications   ALIVE ONCE DAILY WOMENS 50+ PO Take 1 tablet by mouth daily.   allopurinol 100 MG tablet Commonly known as:  ZYLOPRIM Take 1 tablet (100 mg total) by mouth daily.   apixaban 5 MG Tabs tablet Commonly known as:  ELIQUIS Take 1 tablet (5 mg total) by mouth 2 (two) times daily.   atorvastatin 80 MG tablet Commonly known as:   LIPITOR Take 1 tablet (80 mg total) by mouth at bedtime.   baclofen 10 MG tablet Commonly known as:  LIORESAL Take 1 tablet (10 mg total) by mouth 3 (three) times daily. As needed for muscle spasm   Biotin 10 MG Caps Take 10 mg by mouth daily.   calcium citrate 950 MG tablet Commonly known as:  CALCITRATE - dosed in mg elemental calcium Take 1 tablet by mouth 2 (two) times daily.   carvedilol 25 MG tablet Commonly known as:  COREG Take 1 tablet (25 mg total) by mouth 2 (two) times daily.   CHONDROITIN SULFATE PO Take 250 mg by mouth daily.   empagliflozin 10 MG Tabs tablet Commonly known as:  JARDIANCE Take 10 mg by mouth daily.   ferrous sulfate 325 (65 FE) MG tablet Take 325 mg by mouth 2 (two) times daily with a meal.   gabapentin 300 MG capsule Commonly known as:  NEURONTIN Take 300 mg by mouth daily.   HYDROcodone-acetaminophen 10-325 MG tablet Commonly known as:  NORCO Take 1 tablet by mouth every 6 (six) hours as needed.   isosorbide mononitrate 30 MG 24 hr tablet Commonly known as:  IMDUR Take 1 tablet (30 mg total) by mouth daily.   LANTUS 100 UNIT/ML injection Generic drug:  insulin glargine Inject 30 Units into the skin 2 (two) times daily.   levothyroxine 75 MCG tablet Commonly known as:  SYNTHROID, LEVOTHROID Take 75 mcg by mouth daily before breakfast.   losartan 50 MG tablet Commonly known as:  COZAAR Take 1 tablet (50 mg total) by mouth daily.   metFORMIN 500 MG tablet Commonly known as:  GLUCOPHAGE Take 500 mg by mouth 2 (two) times daily with a meal.   nitroGLYCERIN 0.4 MG SL tablet Commonly known as:  NITROSTAT DISSOLVE ONE TABLET UNDER THE TONGUE EVERY 5 MINUTES AS NEEDED FOR CHEST PAIN.   omeprazole 20 MG capsule Commonly  known as:  PRILOSEC Take 20 mg by mouth daily.   ondansetron 4 MG tablet Commonly known as:  ZOFRAN Take 1 tablet (4 mg total) by mouth every 8 (eight) hours as needed for nausea or vomiting.   potassium  chloride 10 MEQ tablet Commonly known as:  K-DUR,KLOR-CON Take 2 tablets (20 mEq total) by mouth daily.   predniSONE 20 MG tablet Commonly known as:  DELTASONE Take 3 tablets once daily for 3 days, then take 2 tablets once daily for 3 days, then take 1 tablet once daily for 3 days, then STOP   sennosides-docusate sodium 8.6-50 MG tablet Commonly known as:  SENOKOT-S Take 2 tablets by mouth daily.   spironolactone 25 MG tablet Commonly known as:  ALDACTONE Take 1 tablet (25 mg total) by mouth every other day.   torsemide 20 MG tablet Commonly known as:  DEMADEX Take 1 tablet (20 mg total) by mouth 2 (two) times daily.   Travoprost (BAK Free) 0.004 % Soln ophthalmic solution Commonly known as:  TRAVATAN Place 1 drop into both eyes daily.        Follow-up Information    Marchia Bond, MD. Go on 05/27/2018.   Specialty:  Orthopedic Surgery Why:  your appointment time is set for 900  Contact information: [place] [address] [phone]        [facility]. Go on 05/27/2018.   Why:  you are scheduled to start OPPT On 05/27/18 after your appointment with Dr. Mardelle Matte. THe apppointment time is 1000. Please go straight over after seeing the MD to do your paperwork.  Contact information: [place], [place] [address]   [phone]       [facility] up.   Specialty:  Home Health Services Why:  HHPT will resume your home health visits and will add a [facility] visit for assessment. Your HH will end prior to starting outpatient physical therapy  Contact information: [place] [address] [phone]        Seward Carol, MD. Schedule an appointment as soon as possible for a visit in 3 week(s).   Specialty:  Internal Medicine Why:  to discuss treatment of gout Contact information: [address] [place] [address] [phone]           TOTAL DISCHARGE TIME:  35 mins  Bonnielee Haff  [facility] Pager [phone]  05/23/2018, 1:33 PM

## 2018-05-23 NOTE — Progress Notes (Signed)
OT Treatment Note    05/23/18 1100  OT Visit Information  Last OT Received On 05/23/18  Assistance Needed +1  History of Present Illness Patient is a 72 y/o female presenting to the ED on 05/20/18 with primary complaints of confusion, fever. 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 Associated R knee pain as well as R wrist pain. CXR No active pulmonary disease. 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 Wrist x rays show Diffuse soft tissue swelling. Negative for fracture or erosion. Mild degenerative change. 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  Precautions  Precautions Fall  Required Braces or Orthoses Other Brace  Other Brace pt with R wrist brace for comfort/pain management  Pain Assessment  Pain Assessment Faces  Faces Pain Scale 4  Pain Location R wrist  Pain Descriptors / Indicators Aching;Discomfort;Grimacing;Guarding  Pain Intervention(s) Limited activity within patient's tolerance  Cognition  Arousal/Alertness Awake/alert  Behavior During Therapy WFL for tasks assessed/performed  Overall Cognitive Status Within Functional Limits for tasks assessed  General Comments tangential with conversation.   Upper Extremity Assessment  Upper Extremity Assessment RUE deficits/detail  RUE Deficits / Details pt with limited wrist and digit ROM due to pain (suspected gout); reports improvements in pain level with wrist brace  ADL  Lower Body Dressing Details (indicate cue type and reason) Pt abl eto donn socks this am with set up  Functional mobility during ADLs Supervision/safety;Rolling walker  General ADL Comments Pt having difficulty holding utensils and toothbrush due to limited R hand ROM. Pt issued built up tubing and educated on use. Will also assess with use aof built up angled spoon; RW handled modified with blue tubing to increase  ability to grip RW  Restrictions  RUE Weight Bearing WBAT  RLE Weight Bearing WBAT  Other Position/Activity Restrictions WBAT   Transfers  Transfers Sit to/from Stand  Sit to Stand Supervision  OT - End of Session  Activity Tolerance Patient tolerated treatment well  Patient left in chair;with call bell/phone within reach  Nurse Communication Mobility status  OT Assessment/Plan  OT Plan Discharge plan remains appropriate;Frequency needs to be updated  OT Visit Diagnosis Other abnormalities of gait and mobility (R26.89);Pain;Muscle weakness (generalized) (M62.81)  Pain - Right/Left Right  Pain - part of body Hand  OT Frequency (ACUTE ONLY) Min 3X/week  Follow Up Recommendations Supervision/Assistance - 24 hour;Follow surgeon's recommendation for DC plan and follow-up therapies;Home health OT  OT Equipment None recommended by OT  AM-PAC OT "6 Clicks" Daily Activity Outcome Measure (Version 2)  Help from another person eating meals? 4  Help from another person taking care of personal grooming? 3  Help from another person toileting, which includes using toliet, bedpan, or urinal? 3  Help from another person bathing (including washing, rinsing, drying)? 3  Help from another person to put on and taking off regular upper body clothing? 4  Help from another person to put on and taking off regular lower body clothing? 3  6 Click Score 20  Acute Rehab OT Goals  Patient Stated Goal hopeful for home today  OT Goal Formulation With patient  Time For Goal Achievement 06/04/18  Potential to Achieve Goals Good  ADL Goals  Pt Will Perform Grooming with supervision;standing  Pt Will Perform Lower Body Bathing with supervision;sit to/from stand  Pt Will Perform Lower Body Dressing with supervision;sit to/from stand  Pt Will  Transfer to Toilet with supervision;ambulating;bedside commode  Pt Will Perform Toileting - Clothing Manipulation and hygiene with supervision;sit to/from stand  Pt Will  Perform Tub/Shower Transfer Tub transfer;Shower transfer;with supervision;ambulating;rolling walker  OT Time Calculation  OT Start Time (ACUTE ONLY) 1100  OT Stop Time (ACUTE ONLY) 1119  OT Time Calculation (min) 19 min  OT General Charges  $OT Visit 1 Visit  OT Treatments  $Self Care/Home Management  8-22 mins  Maurie Boettcher, OT/L   Acute OT Clinical Specialist [place] Pager [phone] Office [phone]

## 2018-05-24 LAB — BODY FLUID CULTURE
Culture: NO GROWTH
Special Requests: NORMAL

## 2018-05-25 LAB — CULTURE, BLOOD (ROUTINE X 2)
CULTURE: NO GROWTH
Culture: NO GROWTH
Special Requests: ADEQUATE
Special Requests: ADEQUATE

## 2018-05-27 ENCOUNTER — Telehealth (HOSPITAL_COMMUNITY): Payer: Self-pay

## 2018-05-27 ENCOUNTER — Other Ambulatory Visit (HOSPITAL_COMMUNITY): Payer: Self-pay

## 2018-05-27 DIAGNOSIS — M1711 Unilateral primary osteoarthritis, right knee: Secondary | ICD-10-CM | POA: Diagnosis not present

## 2018-05-27 NOTE — Telephone Encounter (Signed)
Danielle [name] called me and was upset because she did not understand her "new medications." I asked if she could tell me what she was having trouble with however she asked that I come by and look it over with her. I agreed and advised I would be there in 30 minutes.

## 2018-05-27 NOTE — Progress Notes (Signed)
Danielle [name] was seen at home today at her request. She stated she was confused about some of her medications following knee replacement surgery. I went through her medications and pointed out the medications that are for pain control and explained how to use them safely. She was understanding and agreeable. Her pillbox was still in good shape from me filling it on Friday so I advised I would be back on Friday to refill it.

## 2018-05-28 DIAGNOSIS — E669 Obesity, unspecified: Secondary | ICD-10-CM | POA: Diagnosis not present

## 2018-05-28 DIAGNOSIS — I5041 Acute combined systolic (congestive) and diastolic (congestive) heart failure: Secondary | ICD-10-CM | POA: Diagnosis not present

## 2018-05-28 DIAGNOSIS — D759 Disease of blood and blood-forming organs, unspecified: Secondary | ICD-10-CM | POA: Diagnosis not present

## 2018-05-28 DIAGNOSIS — E039 Hypothyroidism, unspecified: Secondary | ICD-10-CM | POA: Diagnosis not present

## 2018-05-28 DIAGNOSIS — Z471 Aftercare following joint replacement surgery: Secondary | ICD-10-CM | POA: Diagnosis not present

## 2018-05-28 DIAGNOSIS — E119 Type 2 diabetes mellitus without complications: Secondary | ICD-10-CM | POA: Diagnosis not present

## 2018-05-28 DIAGNOSIS — I2510 Atherosclerotic heart disease of native coronary artery without angina pectoris: Secondary | ICD-10-CM | POA: Diagnosis not present

## 2018-05-28 DIAGNOSIS — Z6830 Body mass index (BMI) 30.0-30.9, adult: Secondary | ICD-10-CM | POA: Diagnosis not present

## 2018-05-28 DIAGNOSIS — G4733 Obstructive sleep apnea (adult) (pediatric): Secondary | ICD-10-CM | POA: Diagnosis not present

## 2018-05-28 DIAGNOSIS — I110 Hypertensive heart disease with heart failure: Secondary | ICD-10-CM | POA: Diagnosis not present

## 2018-05-30 ENCOUNTER — Ambulatory Visit: Payer: Medicare HMO | Admitting: Podiatry

## 2018-05-30 ENCOUNTER — Other Ambulatory Visit (HOSPITAL_COMMUNITY): Payer: Self-pay

## 2018-05-30 DIAGNOSIS — I110 Hypertensive heart disease with heart failure: Secondary | ICD-10-CM | POA: Diagnosis not present

## 2018-05-30 DIAGNOSIS — E669 Obesity, unspecified: Secondary | ICD-10-CM | POA: Diagnosis not present

## 2018-05-30 DIAGNOSIS — Z6830 Body mass index (BMI) 30.0-30.9, adult: Secondary | ICD-10-CM | POA: Diagnosis not present

## 2018-05-30 DIAGNOSIS — E039 Hypothyroidism, unspecified: Secondary | ICD-10-CM | POA: Diagnosis not present

## 2018-05-30 DIAGNOSIS — E119 Type 2 diabetes mellitus without complications: Secondary | ICD-10-CM | POA: Diagnosis not present

## 2018-05-30 DIAGNOSIS — I2510 Atherosclerotic heart disease of native coronary artery without angina pectoris: Secondary | ICD-10-CM | POA: Diagnosis not present

## 2018-05-30 DIAGNOSIS — I5041 Acute combined systolic (congestive) and diastolic (congestive) heart failure: Secondary | ICD-10-CM | POA: Diagnosis not present

## 2018-05-30 DIAGNOSIS — G4733 Obstructive sleep apnea (adult) (pediatric): Secondary | ICD-10-CM | POA: Diagnosis not present

## 2018-05-30 DIAGNOSIS — D759 Disease of blood and blood-forming organs, unspecified: Secondary | ICD-10-CM | POA: Diagnosis not present

## 2018-05-30 DIAGNOSIS — Z471 Aftercare following joint replacement surgery: Secondary | ICD-10-CM | POA: Diagnosis not present

## 2018-05-30 NOTE — Progress Notes (Signed)
Paramedicine Encounter    Patient ID: Danielle [name], female    DOB: 22-Oct-1945, 73 y.o.   MRN: [id number]   Patient Care Team: Seward Carol, MD as PCP - General (Internal Medicine) Jorge Ny, LCSW as Social Worker (Licensed Clinical Social Worker)  Patient Active Problem List   Diagnosis Date Noted  . Encephalopathy acute 05/20/2018  . 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 Acute kidney failure ([place]) 05/15/2018  . Primary localized osteoarthritis of right knee 05/13/2018  . (HFpEF) heart failure with preserved ejection fraction ([place]) 12/20/2017  . Obesity (BMI 30-39.9) 11/11/2017  . OSA (obstructive sleep apnea) 02/18/2017  . Essential hypertension   . Diabetes mellitus without complication ([place])   . Cancer ([place])   . Thyroid disease     Current Outpatient Medications:  .  allopurinol (ZYLOPRIM) 100 MG tablet, Take 1 tablet (100 mg total) by mouth daily., Disp: , Rfl:  .  apixaban (ELIQUIS) 5 MG TABS tablet, Take 1 tablet (5 mg total) by mouth 2 (two) times daily., Disp: 60 tablet, Rfl: 3 .  atorvastatin (LIPITOR) 80 MG tablet, Take 1 tablet (80 mg total) by mouth at bedtime., Disp: 30 tablet, Rfl: 11 .  baclofen (LIORESAL) 10 MG tablet, Take 1 tablet (10 mg total) by mouth 3 (three) times daily. As needed for muscle spasm, Disp: 50 tablet, Rfl: 0 .  Biotin 10 MG CAPS, Take 10 mg by mouth daily., Disp: , Rfl:  .  carvedilol (COREG) 25 MG tablet, Take 1 tablet (25 mg total) by mouth 2 (two) times daily., Disp: 180 tablet, Rfl: 3 .  CHONDROITIN SULFATE PO, Take 250 mg by mouth daily., Disp: , Rfl:  .  empagliflozin (JARDIANCE) 10 MG TABS tablet, Take 10 mg by mouth daily., Disp: 30 tablet, Rfl: 11 .  ferrous sulfate 325 (65 FE) MG tablet, Take 325 mg by mouth 2 (two) times daily with a meal., Disp: , Rfl:  .  gabapentin (NEURONTIN) 300 MG capsule, Take 300 mg by mouth daily. , Disp: , Rfl:  .  HYDROcodone-acetaminophen (NORCO) 10-325 MG tablet,  Take 1 tablet by mouth every 6 (six) hours as needed., Disp: 28 tablet, Rfl: 0 .  insulin glargine (LANTUS) 100 UNIT/ML injection, Inject 30 Units into the skin 2 (two) times daily., Disp: , Rfl:  .  isosorbide mononitrate (IMDUR) 30 MG 24 hr tablet, Take 1 tablet (30 mg total) by mouth daily., Disp: 30 tablet, Rfl: 3 .  levothyroxine (SYNTHROID, LEVOTHROID) 75 MCG tablet, Take 75 mcg by mouth daily before breakfast., Disp: , Rfl:  .  losartan (COZAAR) 50 MG tablet, Take 1 tablet (50 mg total) by mouth daily., Disp: 30 tablet, Rfl: 3 .  metFORMIN (GLUCOPHAGE) 500 MG tablet, Take 500 mg by mouth 2 (two) times daily with a meal. , Disp: , Rfl:  .  Multiple Vitamins-Minerals (ALIVE ONCE DAILY WOMENS 50+ PO), Take 1 tablet by mouth daily., Disp: , Rfl:  .  omeprazole (PRILOSEC) 20 MG capsule, Take 20 mg by mouth daily. , Disp: , Rfl:  .  potassium chloride (K-DUR,KLOR-CON) 10 MEQ tablet, Take 2 tablets (20 mEq total) by mouth daily., Disp: 60 tablet, Rfl: 5 .  predniSONE (DELTASONE) 20 MG tablet, Take 3 tablets once daily for 3 days, then take 2 tablets once daily for 3 days, then take 1 tablet once daily for 3 days, then STOP, Disp: 18 tablet, Rfl: 0 .  spironolactone (ALDACTONE) 25 MG tablet, Take 1  tablet (25 mg total) by mouth every other day., Disp: 15 tablet, Rfl: 5 .  torsemide (DEMADEX) 20 MG tablet, Take 1 tablet (20 mg total) by mouth 2 (two) times daily., Disp: 60 tablet, Rfl: 11 .  calcium citrate (CALCITRATE - DOSED IN MG ELEMENTAL CALCIUM) 950 MG tablet, Take 1 tablet by mouth 2 (two) times daily. , Disp: , Rfl:  .  nitroGLYCERIN (NITROSTAT) 0.4 MG SL tablet, DISSOLVE ONE TABLET UNDER THE TONGUE EVERY 5 MINUTES AS NEEDED FOR CHEST PAIN. (Patient not taking: Reported on 05/20/2018), Disp: 25 tablet, Rfl: 3 .  ondansetron (ZOFRAN) 4 MG tablet, Take 1 tablet (4 mg total) by mouth every 8 (eight) hours as needed for nausea or vomiting. (Patient not taking: Reported on 05/30/2018), Disp: 10 tablet,  Rfl: 0 .  sennosides-docusate sodium (SENOKOT-S) 8.6-50 MG tablet, Take 2 tablets by mouth daily. (Patient not taking: Reported on 05/20/2018), Disp: 30 tablet, Rfl: 1 .  Travoprost, BAK Free, (TRAVATAN) 0.004 % SOLN ophthalmic solution, Place 1 drop into both eyes daily., Disp: , Rfl:  Allergies  Allergen Reactions  . Other Other (See Comments)    Blood products  . Sulfa Antibiotics Rash  . Uloric [Febuxostat] Rash      Social History   Socioeconomic History  . Marital status: Widowed    Spouse name: Not on file  . Number of children: 3  . Years of education: Not on file  . Highest education level: Some college, no degree  Occupational History  . Occupation: retired  Scientific laboratory technician  . Financial resource strain: Not hard at all  . Food insecurity:    Worry: Never true    Inability: Never true  . Transportation needs:    Medical: No    Non-medical: No  Tobacco Use  . Smoking status: Former Smoker    Packs/day: 1.00    Years: 22.00    Pack years: 22.00    Types: Cigarettes    Last attempt to quit: 1983    Years since quitting: 37.0  . Smokeless tobacco: Never Used  Substance and Sexual Activity  . Alcohol use: Yes    Comment: 05/22/2018 "glass of wine couple times/wk"  . Drug use: Not Currently  . Sexual activity: Not Currently    Birth control/protection: None, Post-menopausal  Lifestyle  . Physical activity:    Days per week: Not on file    Minutes per session: Not on file  . Stress: Not on file  Relationships  . Social connections:    Talks on phone: Not on file    Gets together: Not on file    Attends religious service: Not on file    Active member of club or organization: Not on file    Attends meetings of clubs or organizations: Not on file    Relationship status: Not on file  . Intimate partner violence:    Fear of current or ex partner: Not on file    Emotionally abused: Not on file    Physically abused: Not on file    Forced sexual activity: Not on  file  Other Topics Concern  . Not on file  Social History Narrative  . Not on file    Physical Exam Cardiovascular:     Rate and Rhythm: Normal rate and regular rhythm.  Pulmonary:     Effort: Pulmonary effort is normal.     Breath sounds: Normal breath sounds.  Musculoskeletal:     Right lower leg: Edema present.  Left lower leg: Edema present.  Skin:    General: Skin is warm and dry.     Capillary Refill: Capillary refill takes less than 2 seconds.  Neurological:     Mental Status: She is alert.  Psychiatric:        Mood and Affect: Mood normal.         Future Appointments  Date Time Provider [place]  08/07/2018 10:40 AM Bensimhon, Shaune Pascal, MD [facility] None    Pulse 66   Resp 16   SpO2 98%   Weight yesterday- 197 lb Last visit weight- 202 lb  Danielle [name] was seen at home today and reported feeling well with the exception of pain in her recently replaced knee and her hands (from gout). She denied chest pain, SOB, headache, dizziness or orthopnea. She reported being compliant with her medications and her weight has been decreasing since coming home from the hospital. Her medications were verified and her pillbox was refilled. No vitals were taken today due to her having had them checked by PT just prior to my arrival.   [name], EMT 05/30/18  ACTION: Home visit completed Next visit planned for 1 week

## 2018-05-31 DIAGNOSIS — D759 Disease of blood and blood-forming organs, unspecified: Secondary | ICD-10-CM | POA: Diagnosis not present

## 2018-05-31 DIAGNOSIS — E119 Type 2 diabetes mellitus without complications: Secondary | ICD-10-CM | POA: Diagnosis not present

## 2018-05-31 DIAGNOSIS — I5041 Acute combined systolic (congestive) and diastolic (congestive) heart failure: Secondary | ICD-10-CM | POA: Diagnosis not present

## 2018-05-31 DIAGNOSIS — E669 Obesity, unspecified: Secondary | ICD-10-CM | POA: Diagnosis not present

## 2018-05-31 DIAGNOSIS — I2510 Atherosclerotic heart disease of native coronary artery without angina pectoris: Secondary | ICD-10-CM | POA: Diagnosis not present

## 2018-05-31 DIAGNOSIS — Z471 Aftercare following joint replacement surgery: Secondary | ICD-10-CM | POA: Diagnosis not present

## 2018-05-31 DIAGNOSIS — E039 Hypothyroidism, unspecified: Secondary | ICD-10-CM | POA: Diagnosis not present

## 2018-05-31 DIAGNOSIS — I110 Hypertensive heart disease with heart failure: Secondary | ICD-10-CM | POA: Diagnosis not present

## 2018-05-31 DIAGNOSIS — Z6830 Body mass index (BMI) 30.0-30.9, adult: Secondary | ICD-10-CM | POA: Diagnosis not present

## 2018-05-31 DIAGNOSIS — G4733 Obstructive sleep apnea (adult) (pediatric): Secondary | ICD-10-CM | POA: Diagnosis not present

## 2018-06-02 DIAGNOSIS — I2510 Atherosclerotic heart disease of native coronary artery without angina pectoris: Secondary | ICD-10-CM | POA: Diagnosis not present

## 2018-06-02 DIAGNOSIS — E119 Type 2 diabetes mellitus without complications: Secondary | ICD-10-CM | POA: Diagnosis not present

## 2018-06-02 DIAGNOSIS — I110 Hypertensive heart disease with heart failure: Secondary | ICD-10-CM | POA: Diagnosis not present

## 2018-06-02 DIAGNOSIS — Z471 Aftercare following joint replacement surgery: Secondary | ICD-10-CM | POA: Diagnosis not present

## 2018-06-02 DIAGNOSIS — E039 Hypothyroidism, unspecified: Secondary | ICD-10-CM | POA: Diagnosis not present

## 2018-06-02 DIAGNOSIS — D759 Disease of blood and blood-forming organs, unspecified: Secondary | ICD-10-CM | POA: Diagnosis not present

## 2018-06-02 DIAGNOSIS — E669 Obesity, unspecified: Secondary | ICD-10-CM | POA: Diagnosis not present

## 2018-06-02 DIAGNOSIS — Z6830 Body mass index (BMI) 30.0-30.9, adult: Secondary | ICD-10-CM | POA: Diagnosis not present

## 2018-06-02 DIAGNOSIS — I5041 Acute combined systolic (congestive) and diastolic (congestive) heart failure: Secondary | ICD-10-CM | POA: Diagnosis not present

## 2018-06-02 DIAGNOSIS — G4733 Obstructive sleep apnea (adult) (pediatric): Secondary | ICD-10-CM | POA: Diagnosis not present

## 2018-06-03 ENCOUNTER — Ambulatory Visit (INDEPENDENT_AMBULATORY_CARE_PROVIDER_SITE_OTHER): Payer: Medicare HMO

## 2018-06-03 DIAGNOSIS — R001 Bradycardia, unspecified: Secondary | ICD-10-CM | POA: Diagnosis not present

## 2018-06-03 DIAGNOSIS — I255 Ischemic cardiomyopathy: Secondary | ICD-10-CM

## 2018-06-04 DIAGNOSIS — D649 Anemia, unspecified: Secondary | ICD-10-CM | POA: Diagnosis not present

## 2018-06-04 DIAGNOSIS — Z96651 Presence of right artificial knee joint: Secondary | ICD-10-CM | POA: Diagnosis not present

## 2018-06-04 DIAGNOSIS — I959 Hypotension, unspecified: Secondary | ICD-10-CM | POA: Diagnosis not present

## 2018-06-04 NOTE — Progress Notes (Signed)
Remote pacemaker transmission.   

## 2018-06-05 LAB — CUP PACEART REMOTE DEVICE CHECK
Battery Remaining Longevity: 90 mo
Battery Voltage: 3.01 V
Brady Statistic AP VP Percent: 0.01 %
Brady Statistic AP VS Percent: 6.01 %
Brady Statistic AS VP Percent: 0.05 %
Brady Statistic AS VS Percent: 93.93 %
Brady Statistic RA Percent Paced: 6 %
Brady Statistic RV Percent Paced: 0.06 %
Date Time Interrogation Session: 20200107214610
Implantable Lead Implant Date: 20160222
Implantable Lead Implant Date: 20160222
Implantable Lead Location: 753859
Implantable Lead Location: 753860
Implantable Lead Model: 5076
Implantable Lead Model: 5076
Implantable Pulse Generator Implant Date: 20160222
Lead Channel Impedance Value: 380 Ohm
Lead Channel Impedance Value: 418 Ohm
Lead Channel Impedance Value: 418 Ohm
Lead Channel Impedance Value: 494 Ohm
Lead Channel Pacing Threshold Amplitude: 0.75 V
Lead Channel Pacing Threshold Amplitude: 0.75 V
Lead Channel Pacing Threshold Pulse Width: 0.4 ms
Lead Channel Pacing Threshold Pulse Width: 0.4 ms
Lead Channel Sensing Intrinsic Amplitude: 20.5 mV
Lead Channel Sensing Intrinsic Amplitude: 20.5 mV
Lead Channel Sensing Intrinsic Amplitude: 5.25 mV
Lead Channel Sensing Intrinsic Amplitude: 5.25 mV
Lead Channel Setting Pacing Amplitude: 2 V
Lead Channel Setting Pacing Amplitude: 2 V
Lead Channel Setting Pacing Pulse Width: 0.4 ms
Lead Channel Setting Sensing Sensitivity: 2 mV

## 2018-06-06 ENCOUNTER — Telehealth (HOSPITAL_COMMUNITY): Payer: Self-pay | Admitting: *Deleted

## 2018-06-06 ENCOUNTER — Other Ambulatory Visit (HOSPITAL_COMMUNITY): Payer: Self-pay

## 2018-06-06 DIAGNOSIS — I959 Hypotension, unspecified: Secondary | ICD-10-CM | POA: Diagnosis not present

## 2018-06-06 NOTE — Progress Notes (Signed)
Paramedicine Encounter    Patient ID: Danielle [name], female    DOB: 12-18-1945, 73 y.o.   MRN: [id number]   Patient Care Team: Seward Carol, MD as PCP - General (Internal Medicine) Jorge Ny, LCSW as Social Worker (Licensed Clinical Social Worker)  Patient Active Problem List   Diagnosis Date Noted  . Encephalopathy acute 05/20/2018  . 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 Acute kidney failure ([place]) 05/15/2018  . Primary localized osteoarthritis of right knee 05/13/2018  . (HFpEF) heart failure with preserved ejection fraction ([place]) 12/20/2017  . Obesity (BMI 30-39.9) 11/11/2017  . OSA (obstructive sleep apnea) 02/18/2017  . Essential hypertension   . Diabetes mellitus without complication ([place])   . Cancer ([place])   . Thyroid disease     Current Outpatient Medications:  .  allopurinol (ZYLOPRIM) 100 MG tablet, Take 1 tablet (100 mg total) by mouth daily., Disp: , Rfl:  .  apixaban (ELIQUIS) 5 MG TABS tablet, Take 1 tablet (5 mg total) by mouth 2 (two) times daily., Disp: 60 tablet, Rfl: 3 .  atorvastatin (LIPITOR) 80 MG tablet, Take 1 tablet (80 mg total) by mouth at bedtime., Disp: 30 tablet, Rfl: 11 .  Biotin 10 MG CAPS, Take 10 mg by mouth daily., Disp: , Rfl:  .  carvedilol (COREG) 25 MG tablet, Take 1 tablet (25 mg total) by mouth 2 (two) times daily., Disp: 180 tablet, Rfl: 3 .  CHONDROITIN SULFATE PO, Take 250 mg by mouth daily., Disp: , Rfl:  .  ferrous sulfate 325 (65 FE) MG tablet, Take 325 mg by mouth 2 (two) times daily with a meal., Disp: , Rfl:  .  gabapentin (NEURONTIN) 300 MG capsule, Take 300 mg by mouth daily. , Disp: , Rfl:  .  insulin glargine (LANTUS) 100 UNIT/ML injection, Inject 30 Units into the skin 2 (two) times daily., Disp: , Rfl:  .  isosorbide mononitrate (IMDUR) 30 MG 24 hr tablet, Take 1 tablet (30 mg total) by mouth daily., Disp: 30 tablet, Rfl: 3 .  levothyroxine (SYNTHROID, LEVOTHROID) 75 MCG tablet, Take 75 mcg  by mouth daily before breakfast., Disp: , Rfl:  .  losartan (COZAAR) 50 MG tablet, Take 1 tablet (50 mg total) by mouth daily., Disp: 30 tablet, Rfl: 3 .  metFORMIN (GLUCOPHAGE) 500 MG tablet, Take 500 mg by mouth 2 (two) times daily with a meal. , Disp: , Rfl:  .  Multiple Vitamins-Minerals (ALIVE ONCE DAILY WOMENS 50+ PO), Take 1 tablet by mouth daily., Disp: , Rfl:  .  omeprazole (PRILOSEC) 20 MG capsule, Take 20 mg by mouth daily. , Disp: , Rfl:  .  potassium chloride (K-DUR,KLOR-CON) 10 MEQ tablet, Take 2 tablets (20 mEq total) by mouth daily., Disp: 60 tablet, Rfl: 5 .  spironolactone (ALDACTONE) 25 MG tablet, Take 1 tablet (25 mg total) by mouth every other day., Disp: 15 tablet, Rfl: 5 .  torsemide (DEMADEX) 20 MG tablet, Take 1 tablet (20 mg total) by mouth 2 (two) times daily., Disp: 60 tablet, Rfl: 11 .  baclofen (LIORESAL) 10 MG tablet, Take 1 tablet (10 mg total) by mouth 3 (three) times daily. As needed for muscle spasm, Disp: 50 tablet, Rfl: 0 .  calcium citrate (CALCITRATE - DOSED IN MG ELEMENTAL CALCIUM) 950 MG tablet, Take 1 tablet by mouth 2 (two) times daily. , Disp: , Rfl:  .  empagliflozin (JARDIANCE) 10 MG TABS tablet, Take 10 mg by mouth daily., Disp: 30  tablet, Rfl: 11 .  HYDROcodone-acetaminophen (NORCO) 10-325 MG tablet, Take 1 tablet by mouth every 6 (six) hours as needed. (Patient not taking: Reported on 06/06/2018), Disp: 28 tablet, Rfl: 0 .  nitroGLYCERIN (NITROSTAT) 0.4 MG SL tablet, DISSOLVE ONE TABLET UNDER THE TONGUE EVERY 5 MINUTES AS NEEDED FOR CHEST PAIN. (Patient not taking: Reported on 05/20/2018), Disp: 25 tablet, Rfl: 3 .  ondansetron (ZOFRAN) 4 MG tablet, Take 1 tablet (4 mg total) by mouth every 8 (eight) hours as needed for nausea or vomiting. (Patient not taking: Reported on 05/30/2018), Disp: 10 tablet, Rfl: 0 .  predniSONE (DELTASONE) 20 MG tablet, Take 3 tablets once daily for 3 days, then take 2 tablets once daily for 3 days, then take 1 tablet once daily  for 3 days, then STOP (Patient not taking: Reported on 06/06/2018), Disp: 18 tablet, Rfl: 0 .  sennosides-docusate sodium (SENOKOT-S) 8.6-50 MG tablet, Take 2 tablets by mouth daily. (Patient not taking: Reported on 05/20/2018), Disp: 30 tablet, Rfl: 1 .  Travoprost, BAK Free, (TRAVATAN) 0.004 % SOLN ophthalmic solution, Place 1 drop into both eyes daily., Disp: , Rfl:  Allergies  Allergen Reactions  . Other Other (See Comments)    Blood products  . Sulfa Antibiotics Rash  . Uloric [Febuxostat] Rash      Social History   Socioeconomic History  . Marital status: Widowed    Spouse name: Not on file  . Number of children: 3  . Years of education: Not on file  . Highest education level: Some college, no degree  Occupational History  . Occupation: retired  Scientific laboratory technician  . Financial resource strain: Not hard at all  . Food insecurity:    Worry: Never true    Inability: Never true  . Transportation needs:    Medical: No    Non-medical: No  Tobacco Use  . Smoking status: Former Smoker    Packs/day: 1.00    Years: 22.00    Pack years: 22.00    Types: Cigarettes    Last attempt to quit: 1983    Years since quitting: 37.0  . Smokeless tobacco: Never Used  Substance and Sexual Activity  . Alcohol use: Yes    Comment: 05/22/2018 "glass of wine couple times/wk"  . Drug use: Not Currently  . Sexual activity: Not Currently    Birth control/protection: None, Post-menopausal  Lifestyle  . Physical activity:    Days per week: Not on file    Minutes per session: Not on file  . Stress: Not on file  Relationships  . Social connections:    Talks on phone: Not on file    Gets together: Not on file    Attends religious service: Not on file    Active member of club or organization: Not on file    Attends meetings of clubs or organizations: Not on file    Relationship status: Not on file  . Intimate partner violence:    Fear of current or ex partner: Not on file    Emotionally  abused: Not on file    Physically abused: Not on file    Forced sexual activity: Not on file  Other Topics Concern  . Not on file  Social History Narrative  . Not on file    Physical Exam Cardiovascular:     Rate and Rhythm: Normal rate and regular rhythm.  Pulmonary:     Effort: Pulmonary effort is normal.     Breath sounds: Normal breath sounds.  Musculoskeletal:     Right lower leg: No edema.     Left lower leg: No edema.  Skin:    General: Skin is warm and dry.     Capillary Refill: Capillary refill takes less than 2 seconds.  Neurological:     Mental Status: She is alert.  Psychiatric:        Mood and Affect: Mood normal.         Future Appointments  Date Time Provider [place]  08/07/2018 10:40 AM Bensimhon, Shaune Pascal, MD [facility] None  09/02/2018  8:35 AM [facility] DEVICE REMOTES [facility]    BP (!) 170/80 (BP Location: Right Arm, Patient Position: Sitting, Cuff Size: Large)   Pulse 82   Resp 16   Wt 197 lb (89.4 kg)   SpO2 98%   BMI 33.81 kg/m   Weight yesterday- 197 lb Last visit weight- N/A   Ms Collett was seen at home today and reported pain in her knee which was recently operated on but otherwise well. She stated she had an episode of weakness and dizziness earlier in the week while the physical therapist was there so they contacted her PCP. The PCP had her stop carvedilol, losartan, spironolactone, and torsemide without consulting the HF team. Today she was found to be hypertensive so all the aforementioned medications were restarted at the order of the HF clinic. Her medications were verified and her pillbox was refilled.   [name], EMT 06/06/18  ACTION: Home visit completed Next visit planned for 1 week

## 2018-06-06 NOTE — Telephone Encounter (Signed)
Zack called to report patient had a few episodes of hypotension last week (reported by physical therapist) and was seen by her PCP her bp was 90/50 during that visit. Her PCP stopped her torsemide, carvedilol, spironolactone, and losartan. Today patients bp is 170/80. Per Lillia Mountain, NP-C restart all of her meds and monitor bp if she becomes hypotensive and symptomatic call our office for medication changes. Zack aware and will inform patient.

## 2018-06-10 DIAGNOSIS — G4733 Obstructive sleep apnea (adult) (pediatric): Secondary | ICD-10-CM | POA: Diagnosis not present

## 2018-06-10 DIAGNOSIS — Z6830 Body mass index (BMI) 30.0-30.9, adult: Secondary | ICD-10-CM | POA: Diagnosis not present

## 2018-06-10 DIAGNOSIS — I2510 Atherosclerotic heart disease of native coronary artery without angina pectoris: Secondary | ICD-10-CM | POA: Diagnosis not present

## 2018-06-10 DIAGNOSIS — E039 Hypothyroidism, unspecified: Secondary | ICD-10-CM | POA: Diagnosis not present

## 2018-06-10 DIAGNOSIS — Z471 Aftercare following joint replacement surgery: Secondary | ICD-10-CM | POA: Diagnosis not present

## 2018-06-10 DIAGNOSIS — E669 Obesity, unspecified: Secondary | ICD-10-CM | POA: Diagnosis not present

## 2018-06-10 DIAGNOSIS — I110 Hypertensive heart disease with heart failure: Secondary | ICD-10-CM | POA: Diagnosis not present

## 2018-06-10 DIAGNOSIS — I5041 Acute combined systolic (congestive) and diastolic (congestive) heart failure: Secondary | ICD-10-CM | POA: Diagnosis not present

## 2018-06-10 DIAGNOSIS — D759 Disease of blood and blood-forming organs, unspecified: Secondary | ICD-10-CM | POA: Diagnosis not present

## 2018-06-10 DIAGNOSIS — E119 Type 2 diabetes mellitus without complications: Secondary | ICD-10-CM | POA: Diagnosis not present

## 2018-06-11 DIAGNOSIS — M1711 Unilateral primary osteoarthritis, right knee: Secondary | ICD-10-CM | POA: Diagnosis not present

## 2018-06-11 DIAGNOSIS — M545 Low back pain: Secondary | ICD-10-CM | POA: Diagnosis not present

## 2018-06-12 DIAGNOSIS — I2510 Atherosclerotic heart disease of native coronary artery without angina pectoris: Secondary | ICD-10-CM | POA: Diagnosis not present

## 2018-06-12 DIAGNOSIS — Z471 Aftercare following joint replacement surgery: Secondary | ICD-10-CM | POA: Diagnosis not present

## 2018-06-12 DIAGNOSIS — E039 Hypothyroidism, unspecified: Secondary | ICD-10-CM | POA: Diagnosis not present

## 2018-06-12 DIAGNOSIS — I110 Hypertensive heart disease with heart failure: Secondary | ICD-10-CM | POA: Diagnosis not present

## 2018-06-12 DIAGNOSIS — Z6830 Body mass index (BMI) 30.0-30.9, adult: Secondary | ICD-10-CM | POA: Diagnosis not present

## 2018-06-12 DIAGNOSIS — I5041 Acute combined systolic (congestive) and diastolic (congestive) heart failure: Secondary | ICD-10-CM | POA: Diagnosis not present

## 2018-06-12 DIAGNOSIS — G4733 Obstructive sleep apnea (adult) (pediatric): Secondary | ICD-10-CM | POA: Diagnosis not present

## 2018-06-12 DIAGNOSIS — D759 Disease of blood and blood-forming organs, unspecified: Secondary | ICD-10-CM | POA: Diagnosis not present

## 2018-06-12 DIAGNOSIS — E119 Type 2 diabetes mellitus without complications: Secondary | ICD-10-CM | POA: Diagnosis not present

## 2018-06-12 DIAGNOSIS — E669 Obesity, unspecified: Secondary | ICD-10-CM | POA: Diagnosis not present

## 2018-06-13 ENCOUNTER — Other Ambulatory Visit (HOSPITAL_COMMUNITY): Payer: Self-pay

## 2018-06-13 DIAGNOSIS — Z471 Aftercare following joint replacement surgery: Secondary | ICD-10-CM | POA: Diagnosis not present

## 2018-06-13 DIAGNOSIS — I5041 Acute combined systolic (congestive) and diastolic (congestive) heart failure: Secondary | ICD-10-CM | POA: Diagnosis not present

## 2018-06-13 DIAGNOSIS — E039 Hypothyroidism, unspecified: Secondary | ICD-10-CM | POA: Diagnosis not present

## 2018-06-13 DIAGNOSIS — E119 Type 2 diabetes mellitus without complications: Secondary | ICD-10-CM | POA: Diagnosis not present

## 2018-06-13 DIAGNOSIS — E669 Obesity, unspecified: Secondary | ICD-10-CM | POA: Diagnosis not present

## 2018-06-13 DIAGNOSIS — I110 Hypertensive heart disease with heart failure: Secondary | ICD-10-CM | POA: Diagnosis not present

## 2018-06-13 DIAGNOSIS — D759 Disease of blood and blood-forming organs, unspecified: Secondary | ICD-10-CM | POA: Diagnosis not present

## 2018-06-13 DIAGNOSIS — I2510 Atherosclerotic heart disease of native coronary artery without angina pectoris: Secondary | ICD-10-CM | POA: Diagnosis not present

## 2018-06-13 DIAGNOSIS — G4733 Obstructive sleep apnea (adult) (pediatric): Secondary | ICD-10-CM | POA: Diagnosis not present

## 2018-06-13 DIAGNOSIS — Z6830 Body mass index (BMI) 30.0-30.9, adult: Secondary | ICD-10-CM | POA: Diagnosis not present

## 2018-06-13 NOTE — Progress Notes (Signed)
Paramedicine Encounter    Patient ID: Danielle [name], female    DOB: 06/26/1945, 73 y.o.   MRN: [id number]   Patient Care Team: Seward Carol, MD as PCP - General (Internal Medicine) Jorge Ny, LCSW as Social Worker (Licensed Clinical Social Worker)  Patient Active Problem List   Diagnosis Date Noted  . Encephalopathy acute 05/20/2018  . 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 Acute kidney failure ([place]) 05/15/2018  . Primary localized osteoarthritis of right knee 05/13/2018  . (HFpEF) heart failure with preserved ejection fraction ([place]) 12/20/2017  . Obesity (BMI 30-39.9) 11/11/2017  . OSA (obstructive sleep apnea) 02/18/2017  . Essential hypertension   . Diabetes mellitus without complication ([place])   . Cancer ([place])   . Thyroid disease     Current Outpatient Medications:  .  allopurinol (ZYLOPRIM) 100 MG tablet, Take 1 tablet (100 mg total) by mouth daily., Disp: , Rfl:  .  apixaban (ELIQUIS) 5 MG TABS tablet, Take 1 tablet (5 mg total) by mouth 2 (two) times daily., Disp: 60 tablet, Rfl: 3 .  atorvastatin (LIPITOR) 80 MG tablet, Take 1 tablet (80 mg total) by mouth at bedtime., Disp: 30 tablet, Rfl: 11 .  baclofen (LIORESAL) 10 MG tablet, Take 1 tablet (10 mg total) by mouth 3 (three) times daily. As needed for muscle spasm, Disp: 50 tablet, Rfl: 0 .  Biotin 10 MG CAPS, Take 10 mg by mouth daily., Disp: , Rfl:  .  carvedilol (COREG) 25 MG tablet, Take 1 tablet (25 mg total) by mouth 2 (two) times daily., Disp: 180 tablet, Rfl: 3 .  CHONDROITIN SULFATE PO, Take 250 mg by mouth daily., Disp: , Rfl:  .  empagliflozin (JARDIANCE) 10 MG TABS tablet, Take 10 mg by mouth daily., Disp: 30 tablet, Rfl: 11 .  ferrous sulfate 325 (65 FE) MG tablet, Take 325 mg by mouth 2 (two) times daily with a meal., Disp: , Rfl:  .  gabapentin (NEURONTIN) 300 MG capsule, Take 300 mg by mouth daily. , Disp: , Rfl:  .  insulin glargine (LANTUS) 100 UNIT/ML injection, Inject  30 Units into the skin 2 (two) times daily., Disp: , Rfl:  .  isosorbide mononitrate (IMDUR) 30 MG 24 hr tablet, Take 1 tablet (30 mg total) by mouth daily., Disp: 30 tablet, Rfl: 3 .  levothyroxine (SYNTHROID, LEVOTHROID) 75 MCG tablet, Take 75 mcg by mouth daily before breakfast., Disp: , Rfl:  .  losartan (COZAAR) 50 MG tablet, Take 1 tablet (50 mg total) by mouth daily., Disp: 30 tablet, Rfl: 3 .  metFORMIN (GLUCOPHAGE) 500 MG tablet, Take 500 mg by mouth 2 (two) times daily with a meal. , Disp: , Rfl:  .  Multiple Vitamins-Minerals (ALIVE ONCE DAILY WOMENS 50+ PO), Take 1 tablet by mouth daily., Disp: , Rfl:  .  omeprazole (PRILOSEC) 20 MG capsule, Take 20 mg by mouth daily. , Disp: , Rfl:  .  potassium chloride (K-DUR,KLOR-CON) 10 MEQ tablet, Take 2 tablets (20 mEq total) by mouth daily., Disp: 60 tablet, Rfl: 5 .  spironolactone (ALDACTONE) 25 MG tablet, Take 1 tablet (25 mg total) by mouth every other day., Disp: 15 tablet, Rfl: 5 .  torsemide (DEMADEX) 20 MG tablet, Take 1 tablet (20 mg total) by mouth 2 (two) times daily., Disp: 60 tablet, Rfl: 11 .  calcium citrate (CALCITRATE - DOSED IN MG ELEMENTAL CALCIUM) 950 MG tablet, Take 1 tablet by mouth 2 (two) times daily. , Disp: ,  Rfl:  .  HYDROcodone-acetaminophen (NORCO) 10-325 MG tablet, Take 1 tablet by mouth every 6 (six) hours as needed. (Patient not taking: Reported on 06/06/2018), Disp: 28 tablet, Rfl: 0 .  nitroGLYCERIN (NITROSTAT) 0.4 MG SL tablet, DISSOLVE ONE TABLET UNDER THE TONGUE EVERY 5 MINUTES AS NEEDED FOR CHEST PAIN. (Patient not taking: Reported on 05/20/2018), Disp: 25 tablet, Rfl: 3 .  ondansetron (ZOFRAN) 4 MG tablet, Take 1 tablet (4 mg total) by mouth every 8 (eight) hours as needed for nausea or vomiting. (Patient not taking: Reported on 05/30/2018), Disp: 10 tablet, Rfl: 0 .  predniSONE (DELTASONE) 20 MG tablet, Take 3 tablets once daily for 3 days, then take 2 tablets once daily for 3 days, then take 1 tablet once daily  for 3 days, then STOP (Patient not taking: Reported on 06/06/2018), Disp: 18 tablet, Rfl: 0 .  sennosides-docusate sodium (SENOKOT-S) 8.6-50 MG tablet, Take 2 tablets by mouth daily. (Patient not taking: Reported on 05/20/2018), Disp: 30 tablet, Rfl: 1 .  Travoprost, BAK Free, (TRAVATAN) 0.004 % SOLN ophthalmic solution, Place 1 drop into both eyes daily., Disp: , Rfl:  Allergies  Allergen Reactions  . Other Other (See Comments)    Blood products  . Sulfa Antibiotics Rash  . Uloric [Febuxostat] Rash      Social History   Socioeconomic History  . Marital status: Widowed    Spouse name: Not on file  . Number of children: 3  . Years of education: Not on file  . Highest education level: Some college, no degree  Occupational History  . Occupation: retired  Scientific laboratory technician  . Financial resource strain: Not hard at all  . Food insecurity:    Worry: Never true    Inability: Never true  . Transportation needs:    Medical: No    Non-medical: No  Tobacco Use  . Smoking status: Former Smoker    Packs/day: 1.00    Years: 22.00    Pack years: 22.00    Types: Cigarettes    Last attempt to quit: 1983    Years since quitting: 37.0  . Smokeless tobacco: Never Used  Substance and Sexual Activity  . Alcohol use: Yes    Comment: 05/22/2018 "glass of wine couple times/wk"  . Drug use: Not Currently  . Sexual activity: Not Currently    Birth control/protection: None, Post-menopausal  Lifestyle  . Physical activity:    Days per week: Not on file    Minutes per session: Not on file  . Stress: Not on file  Relationships  . Social connections:    Talks on phone: Not on file    Gets together: Not on file    Attends religious service: Not on file    Active member of club or organization: Not on file    Attends meetings of clubs or organizations: Not on file    Relationship status: Not on file  . Intimate partner violence:    Fear of current or ex partner: Not on file    Emotionally  abused: Not on file    Physically abused: Not on file    Forced sexual activity: Not on file  Other Topics Concern  . Not on file  Social History Narrative  . Not on file    Physical Exam Neck:     Musculoskeletal: Normal range of motion.  Cardiovascular:     Rate and Rhythm: Normal rate and regular rhythm.     Pulses: Normal pulses.  Pulmonary:  Effort: Pulmonary effort is normal.     Breath sounds: Normal breath sounds.  Musculoskeletal: Normal range of motion.     Right lower leg: Edema present.     Left lower leg: Edema present.  Skin:    General: Skin is warm and dry.     Capillary Refill: Capillary refill takes less than 2 seconds.  Neurological:     Mental Status: She is alert and oriented to person, place, and time.  Psychiatric:        Mood and Affect: Mood normal.         Future Appointments  Date Time Provider [place]  08/07/2018 10:40 AM Bensimhon, Shaune Pascal, MD [facility] None  09/02/2018  8:35 AM [facility] DEVICE REMOTES [facility]    BP 94/60 (BP Location: Right Arm, Patient Position: Sitting, Cuff Size: Large)   Pulse 76   Resp 16   Wt 186 lb 12.8 oz (84.7 kg)   SpO2 97%   BMI 32.06 kg/m   Weight yesterday- Can't remember Last visit weight- 197 lb  Ms Oconnor was seen at home today and reported feeling well. She denied chest pain, SOB, headache, dizziness or orthopnea. She stated she has been compliant with her medications and her weight has been trending down. She is upset with her insurance company at present because they told her they were sending a shower chair last week and it still has not arrived. I will check again next week to see if they have gotten one for her and if not, I will reach out to the HF clinic for possible assistance. Her medications were verified and her pillbox was refilled.   [name], EMT 06/13/18  ACTION: Home visit completed Next visit planned for 1 week

## 2018-06-16 DIAGNOSIS — D759 Disease of blood and blood-forming organs, unspecified: Secondary | ICD-10-CM | POA: Diagnosis not present

## 2018-06-16 DIAGNOSIS — I110 Hypertensive heart disease with heart failure: Secondary | ICD-10-CM | POA: Diagnosis not present

## 2018-06-16 DIAGNOSIS — E119 Type 2 diabetes mellitus without complications: Secondary | ICD-10-CM | POA: Diagnosis not present

## 2018-06-16 DIAGNOSIS — I5041 Acute combined systolic (congestive) and diastolic (congestive) heart failure: Secondary | ICD-10-CM | POA: Diagnosis not present

## 2018-06-16 DIAGNOSIS — E669 Obesity, unspecified: Secondary | ICD-10-CM | POA: Diagnosis not present

## 2018-06-16 DIAGNOSIS — I2510 Atherosclerotic heart disease of native coronary artery without angina pectoris: Secondary | ICD-10-CM | POA: Diagnosis not present

## 2018-06-16 DIAGNOSIS — Z471 Aftercare following joint replacement surgery: Secondary | ICD-10-CM | POA: Diagnosis not present

## 2018-06-16 DIAGNOSIS — G4733 Obstructive sleep apnea (adult) (pediatric): Secondary | ICD-10-CM | POA: Diagnosis not present

## 2018-06-16 DIAGNOSIS — Z6830 Body mass index (BMI) 30.0-30.9, adult: Secondary | ICD-10-CM | POA: Diagnosis not present

## 2018-06-16 DIAGNOSIS — E039 Hypothyroidism, unspecified: Secondary | ICD-10-CM | POA: Diagnosis not present

## 2018-06-18 DIAGNOSIS — M25661 Stiffness of right knee, not elsewhere classified: Secondary | ICD-10-CM | POA: Diagnosis not present

## 2018-06-18 DIAGNOSIS — R262 Difficulty in walking, not elsewhere classified: Secondary | ICD-10-CM | POA: Diagnosis not present

## 2018-06-18 DIAGNOSIS — M25461 Effusion, right knee: Secondary | ICD-10-CM | POA: Diagnosis not present

## 2018-06-18 DIAGNOSIS — M6281 Muscle weakness (generalized): Secondary | ICD-10-CM | POA: Diagnosis not present

## 2018-06-20 ENCOUNTER — Emergency Department (HOSPITAL_COMMUNITY): Payer: Medicare HMO

## 2018-06-20 ENCOUNTER — Telehealth (HOSPITAL_COMMUNITY): Payer: Self-pay

## 2018-06-20 ENCOUNTER — Encounter (HOSPITAL_COMMUNITY): Payer: Self-pay | Admitting: Emergency Medicine

## 2018-06-20 ENCOUNTER — Other Ambulatory Visit (HOSPITAL_COMMUNITY): Payer: Self-pay

## 2018-06-20 ENCOUNTER — Telehealth (HOSPITAL_COMMUNITY): Payer: Self-pay | Admitting: *Deleted

## 2018-06-20 ENCOUNTER — Other Ambulatory Visit (HOSPITAL_COMMUNITY): Payer: Self-pay | Admitting: Internal Medicine

## 2018-06-20 ENCOUNTER — Emergency Department (HOSPITAL_COMMUNITY)
Admission: EM | Admit: 2018-06-20 | Discharge: 2018-06-20 | Disposition: A | Discharge status: Home / Self Care | Payer: Medicare HMO | Attending: Emergency Medicine | Admitting: Emergency Medicine

## 2018-06-20 DIAGNOSIS — Z79899 Other long term (current) drug therapy: Secondary | ICD-10-CM | POA: Diagnosis not present

## 2018-06-20 DIAGNOSIS — E039 Hypothyroidism, unspecified: Secondary | ICD-10-CM | POA: Diagnosis not present

## 2018-06-20 DIAGNOSIS — E860 Dehydration: Secondary | ICD-10-CM | POA: Diagnosis not present

## 2018-06-20 DIAGNOSIS — I959 Hypotension, unspecified: Secondary | ICD-10-CM | POA: Diagnosis not present

## 2018-06-20 DIAGNOSIS — M5489 Other dorsalgia: Secondary | ICD-10-CM | POA: Diagnosis not present

## 2018-06-20 DIAGNOSIS — E119 Type 2 diabetes mellitus without complications: Secondary | ICD-10-CM | POA: Insufficient documentation

## 2018-06-20 DIAGNOSIS — M545 Low back pain: Secondary | ICD-10-CM | POA: Diagnosis not present

## 2018-06-20 DIAGNOSIS — M7989 Other specified soft tissue disorders: Secondary | ICD-10-CM | POA: Diagnosis not present

## 2018-06-20 DIAGNOSIS — R52 Pain, unspecified: Secondary | ICD-10-CM | POA: Diagnosis not present

## 2018-06-20 DIAGNOSIS — Z87891 Personal history of nicotine dependence: Secondary | ICD-10-CM | POA: Diagnosis not present

## 2018-06-20 DIAGNOSIS — I700 Atherosclerosis of aorta: Secondary | ICD-10-CM | POA: Diagnosis not present

## 2018-06-20 DIAGNOSIS — R609 Edema, unspecified: Secondary | ICD-10-CM | POA: Diagnosis not present

## 2018-06-20 LAB — CBC
HEMATOCRIT: 28.9 % — AB (ref 36.0–46.0)
Hemoglobin: 8.5 g/dL — ABNORMAL LOW (ref 12.0–15.0)
MCH: 20.4 pg — ABNORMAL LOW (ref 26.0–34.0)
MCHC: 29.4 g/dL — ABNORMAL LOW (ref 30.0–36.0)
MCV: 69.3 fL — ABNORMAL LOW (ref 80.0–100.0)
Platelets: 236 10*3/uL (ref 150–400)
RBC: 4.17 MIL/uL (ref 3.87–5.11)
RDW: 19.9 % — ABNORMAL HIGH (ref 11.5–15.5)
WBC: 8.3 10*3/uL (ref 4.0–10.5)
nRBC: 0 % (ref 0.0–0.2)

## 2018-06-20 LAB — I-STAT TROPONIN, ED: Troponin i, poc: 0.01 ng/mL (ref 0.00–0.08)

## 2018-06-20 LAB — BASIC METABOLIC PANEL
Anion gap: 11 (ref 5–15)
BUN: 40 mg/dL — ABNORMAL HIGH (ref 8–23)
CO2: 25 mmol/L (ref 22–32)
Calcium: 9.1 mg/dL (ref 8.9–10.3)
Chloride: 99 mmol/L (ref 98–111)
Creatinine, Ser: 1.9 mg/dL — ABNORMAL HIGH (ref 0.44–1.00)
GFR calc Af Amer: 30 mL/min — ABNORMAL LOW (ref >60)
GFR calc non Af Amer: 26 mL/min — ABNORMAL LOW (ref >60)
Glucose, Bld: 116 mg/dL — ABNORMAL HIGH (ref 70–99)
Potassium: 4.1 mmol/L (ref 3.5–5.1)
Sodium: 135 mmol/L (ref 135–145)

## 2018-06-20 LAB — BRAIN NATRIURETIC PEPTIDE: B Natriuretic Peptide: 66.4 pg/mL (ref 0.0–100.0)

## 2018-06-20 MED ORDER — SODIUM CHLORIDE 0.9 % IV SOLN
INTRAVENOUS | Status: DC
  Administered 2018-06-20: 16:00:00 via INTRAVENOUS

## 2018-06-20 MED ORDER — SODIUM CHLORIDE 0.9 % IV BOLUS
500.0000 mL | Freq: Once | INTRAVENOUS | Status: AC
  Administered 2018-06-20: 500 mL via INTRAVENOUS

## 2018-06-20 MED ORDER — ACETAMINOPHEN 325 MG PO TABS
650.0000 mg | ORAL_TABLET | Freq: Once | ORAL | Status: AC
  Administered 2018-06-20: 650 mg via ORAL
  Filled 2018-06-20: qty 2

## 2018-06-20 NOTE — ED Provider Notes (Signed)
[facility] EMERGENCY DEPARTMENT Provider Note   CSN: [id number] Arrival date & time: 06/20/18  1516     History   Chief Complaint Chief Complaint  Patient presents with  . Hypotension    HPI Danielle [name] is a 73 y.o. female.  Patient brought in by EMS.  Patient had a community check.  Was noted to have a blood pressure of 78/56.  Patient has a history of congestive heart failure.  Patient has not been keeping up with fluid intake but has been taking diuretics as prescribed.  Denies feeling dizzy any chest pain abdominal pain or shortness of breath.  Patient does have bilateral lower extremity leg edema but states that it is less than normal.  Patient states that she had been told her blood pressure was low she would not be here.  Patient only complaint is a low back pain which is been pre-existing due to a disc problem.  Patient had recent right knee replacement about 3 weeks ago.     Past Medical History:  Diagnosis Date  . Acute combined systolic and diastolic congestive heart failure ([place]) 02/05/2017  . 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 Acute kidney failure ([place]) 05/15/2018  . Arthritis    "all over" (05/22/2018)  . Blood dyscrasia    per pt-has small blood cells-appears as if anemic  . Breast cancer, left breast ([place]) 1985  . CHF (congestive heart failure) ([place])   . Complication of anesthesia    difficult to awaken from per pt  . Coronary artery disease   . Dyspnea   . Essential hypertension   . Heart disease   . Heart murmur   . History of gout   . Hypothyroidism   . Obesity (BMI 30-39.9) 11/11/2017  . OSA (obstructive sleep apnea) 02/18/2017    severe obstructive sleep apnea with an AHI of 75.6/h and mild central sleep apnea with a CAI of 7.7/h.  Oxygen saturations dropped as low as 82%.   He is on CPAP at 9 cm H2O.  . OSA on CPAP   . Personal history of chemotherapy   . Personal history of radiation therapy    . Presence of permanent cardiac pacemaker   . Primary localized osteoarthritis of right knee 05/13/2018  . Refusal of blood transfusions as patient is Jehovah's Witness   . Type II diabetes mellitus [facility])     Patient Active Problem List   Diagnosis Date Noted  . Encephalopathy acute 05/20/2018  . 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 Acute kidney failure ([place]) 05/15/2018  . Primary localized osteoarthritis of right knee 05/13/2018  . (HFpEF) heart failure with preserved ejection fraction ([place]) 12/20/2017  . Obesity (BMI 30-39.9) 11/11/2017  . OSA (obstructive sleep apnea) 02/18/2017  . Essential hypertension   . Diabetes mellitus without complication ([place])   . Cancer ([place])   . Thyroid disease     Past Surgical History:  Procedure Laterality Date  . BREAST BIOPSY Left 1985  . BREAST LUMPECTOMY WITH NEEDLE LOCALIZATION AND AXILLARY LYMPH NODE DISSECTION  1985  . CATARACT EXTRACTION W/ INTRAOCULAR LENS  IMPLANT, BILATERAL Bilateral   . CORONARY ANGIOPLASTY WITH STENT PLACEMENT  2010, 2012,2017    2 done 2010 and 1 replaced 2012 and 2 replaced in 2017  . DILATION AND CURETTAGE OF UTERUS    . INSERT / REPLACE / REMOVE PACEMAKER  07/19/2014  . JOINT REPLACEMENT    .  REPLACEMENT TOTAL KNEE Left 09/2012  . THYROIDECTOMY, PARTIAL  1978  . TONSILLECTOMY    . TOTAL KNEE ARTHROPLASTY Right 05/13/2018   Procedure: TOTAL KNEE ARTHROPLASTY;  Surgeon: Marchia Bond, MD;  Location: [facility]S;  Service: Orthopedics;  Laterality: Right;  . TUBAL LIGATION       OB History   No obstetric history on file.      Home Medications    Prior to Admission medications   Medication Sig Start Date End Date Taking? Authorizing Provider  acetaminophen (TYLENOL) 500 MG tablet Take 500-1,000 mg by mouth every 6 (six) hours as needed (for pain or headaches).   Yes [provider]  allopurinol (ZYLOPRIM) 100 MG tablet Take 1 tablet (100 mg total) by mouth daily.  11/08/17  Yes Georgiana Shore, NP  apixaban (ELIQUIS) 5 MG TABS tablet Take 1 tablet (5 mg total) by mouth 2 (two) times daily. 04/03/18  Yes Sherran Needs, NP  atorvastatin (LIPITOR) 80 MG tablet Take 1 tablet (80 mg total) by mouth at bedtime. 06/14/17  Yes Bensimhon, Shaune Pascal, MD  Biotin 10 MG CAPS Take 10 mg by mouth daily.   Yes [provider]  calcium citrate (CALCITRATE - DOSED IN MG ELEMENTAL CALCIUM) 950 MG tablet Take 1 tablet by mouth 2 (two) times daily.    Yes [provider]  carvedilol (COREG) 25 MG tablet Take 1 tablet (25 mg total) by mouth 2 (two) times daily. 01/24/18 06/20/18 Yes Bensimhon, Shaune Pascal, MD  CHONDROITIN SULFATE PO Take 250 mg by mouth daily.   Yes [provider]  empagliflozin (JARDIANCE) 10 MG TABS tablet Take 10 mg by mouth daily. 02/13/18  Yes Bensimhon, Shaune Pascal, MD  ferrous sulfate 325 (65 FE) MG tablet Take 325 mg by mouth 2 (two) times daily with a meal.   Yes [provider]  gabapentin (NEURONTIN) 300 MG capsule Take 300 mg by mouth daily.    Yes [provider]  insulin glargine (LANTUS) 100 UNIT/ML injection Inject 20 Units into the skin 2 (two) times daily after a meal.    Yes [provider]  isosorbide mononitrate (IMDUR) 30 MG 24 hr tablet Take 1 tablet (30 mg total) by mouth daily. 05/31/17  Yes Bensimhon, Shaune Pascal, MD  levothyroxine (SYNTHROID, LEVOTHROID) 75 MCG tablet Take 75 mcg by mouth daily before breakfast.   Yes [provider]  losartan (COZAAR) 50 MG tablet TAKE 1 TABLET BY MOUTH ONCE DAILY Patient taking differently: Take 50 mg by mouth daily.  06/20/18  Yes Bensimhon, Shaune Pascal, MD  metFORMIN (GLUCOPHAGE) 500 MG tablet Take 500 mg by mouth 2 (two) times daily with a meal.    Yes [provider]  Multiple Vitamins-Minerals (ALIVE ONCE DAILY WOMENS 50+ PO) Take 1 tablet by mouth daily.   Yes [provider]  nitroGLYCERIN (NITROSTAT) 0.4 MG SL tablet DISSOLVE ONE  TABLET UNDER THE TONGUE EVERY 5 MINUTES AS NEEDED FOR CHEST PAIN. Patient taking differently: Place 0.4 mg under the tongue every 5 (five) minutes as needed for chest pain.  01/14/18  Yes Camnitz, Will Hassell Done, MD  omeprazole (PRILOSEC) 20 MG capsule Take 20 mg by mouth daily.    Yes [provider]  potassium chloride (K-DUR,KLOR-CON) 10 MEQ tablet Take 2 tablets (20 mEq total) by mouth daily. 01/24/18  Yes Bensimhon, Shaune Pascal, MD  spironolactone (ALDACTONE) 25 MG tablet TAKE 1 TABLET BY MOUTH EVERY OTHER DAY Patient taking differently: Take 25 mg by mouth  every other day.  06/20/18  Yes Bensimhon, Shaune Pascal, MD  torsemide (DEMADEX) 20 MG tablet Take 1 tablet (20 mg total) by mouth 2 (two) times daily. 11/22/17  Yes Georgiana Shore, NP  Travoprost, BAK Free, (TRAVATAN) 0.004 % SOLN ophthalmic solution Place 1 drop into both eyes at bedtime.    Yes [provider]  baclofen (LIORESAL) 10 MG tablet Take 1 tablet (10 mg total) by mouth 3 (three) times daily. As needed for muscle spasm Patient not taking: Reported on 06/20/2018 05/13/18   Marchia Bond, MD  HYDROcodone-acetaminophen [facility]) 10-325 MG tablet Take 1 tablet by mouth every 6 (six) hours as needed. Patient not taking: Reported on 06/20/2018 05/13/18   Marchia Bond, MD  ondansetron (ZOFRAN) 4 MG tablet Take 1 tablet (4 mg total) by mouth every 8 (eight) hours as needed for nausea or vomiting. Patient not taking: Reported on 06/20/2018 05/13/18   Marchia Bond, MD  predniSONE (DELTASONE) 20 MG tablet Take 3 tablets once daily for 3 days, then take 2 tablets once daily for 3 days, then take 1 tablet once daily for 3 days, then STOP Patient not taking: Reported on 06/20/2018 05/23/18   Bonnielee Haff, MD  sennosides-docusate sodium (SENOKOT-S) 8.6-50 MG tablet Take 2 tablets by mouth daily. Patient not taking: Reported on 06/20/2018 05/13/18   Marchia Bond, MD    Family History Family History  Problem Relation Age of Onset    . Multiple myeloma Mother   . Heart disease Father   . Other Sister   . Heart disease Brother   . Diabetes Sister   . Other Sister     Social History Social History   Tobacco Use  . Smoking status: Former Smoker    Packs/day: 1.00    Years: 22.00    Pack years: 22.00    Types: Cigarettes    Last attempt to quit: 1983    Years since quitting: 37.0  . Smokeless tobacco: Never Used  Substance Use Topics  . Alcohol use: Yes    Comment: 05/22/2018 "glass of wine couple times/wk"  . Drug use: Not Currently     Allergies   Other; Sulfa antibiotics; and Uloric [febuxostat]   Review of Systems Review of Systems  Constitutional: Negative for chills and fever.  HENT: Negative for rhinorrhea and sore throat.   Eyes: Negative for visual disturbance.  Respiratory: Negative for cough and shortness of breath.   Cardiovascular: Positive for leg swelling. Negative for chest pain.  Gastrointestinal: Negative for abdominal pain, diarrhea, nausea and vomiting.  Genitourinary: Negative for dysuria.  Musculoskeletal: Positive for back pain. Negative for neck pain.  Skin: Negative for rash.  Neurological: Negative for dizziness, light-headedness and headaches.  Hematological: Does not bruise/bleed easily.  Psychiatric/Behavioral: Negative for confusion.     Physical Exam Updated Vital Signs BP 111/61   Pulse 68   Temp 98.2 F (36.8 C) (Oral)   Resp 15   Ht 1.626 m (5' 4" )   Wt 85.3 kg   SpO2 100%   BMI 32.27 kg/m   Physical Exam Vitals signs and nursing note reviewed.  Constitutional:      General: She is not in acute distress.    Appearance: She is well-developed.  HENT:     Head: Normocephalic and atraumatic.     Mouth/Throat:     Mouth: Mucous membranes are moist.  Eyes:     Conjunctiva/sclera: Conjunctivae normal.  Neck:     Musculoskeletal: Neck supple.  Cardiovascular:  Rate and Rhythm: Normal rate and regular rhythm.     Heart sounds: No murmur.   Pulmonary:     Effort: Pulmonary effort is normal. No respiratory distress.     Breath sounds: Normal breath sounds.  Abdominal:     Palpations: Abdomen is soft.     Tenderness: There is no abdominal tenderness.  Musculoskeletal:        General: Swelling present.     Right lower leg: Edema present.     Left lower leg: Edema present.     Comments: Well-healing surgical scar to the right knee.  Left knee with old surgical scar.  Dorsalis pedis pulses 1+ bilaterally.  Skin:    General: Skin is warm and dry.     Capillary Refill: Capillary refill takes less than 2 seconds.  Neurological:     General: No focal deficit present.     Mental Status: She is alert and oriented to person, place, and time.      ED Treatments / Results  Labs (all labs ordered are listed, but only abnormal results are displayed) Labs Reviewed  CBC - Abnormal; Notable for the following components:      Result Value   Hemoglobin 8.5 (*)    HCT 28.9 (*)    MCV 69.3 (*)    MCH 20.4 (*)    MCHC 29.4 (*)    RDW 19.9 (*)    All other components within normal limits  BASIC METABOLIC PANEL - Abnormal; Notable for the following components:   Glucose, Bld 116 (*)    BUN 40 (*)    Creatinine, Ser 1.90 (*)    GFR calc non Af Amer 26 (*)    GFR calc Af Amer 30 (*)    All other components within normal limits  BRAIN NATRIURETIC PEPTIDE  I-STAT TROPONIN, ED    EKG EKG Interpretation  Date/Time:  Friday June 20 2018 15:20:35 EST Ventricular Rate:  71 PR Interval:    QRS Duration: 98 QT Interval:  406 QTC Calculation: 442 R Axis:   9 Text Interpretation:  Sinus rhythm Inferior infarct, old Minimal ST elevation, anterior leads Artifact Confirmed by Fredia Sorrow [phone]) on 06/20/2018 3:32:50 PM   Radiology Dg Chest 2 View  Result Date: 06/20/2018 CLINICAL DATA:  Hypotension today. EXAM: CHEST - 2 VIEW COMPARISON:  Single-view of the chest 05/20/2018. FINDINGS: Heart size is upper normal. Aortic  atherosclerosis is seen. Lungs are clear. No pneumothorax or pleural effusion. No acute or focal bony abnormality. Pacing device noted. IMPRESSION: No acute disease. Atherosclerosis. Electronically Signed   By: Inge Rise M.D.   On: 06/20/2018 16:30    Procedures Procedures (including critical care time)  Medications Ordered in ED Medications  0.9 %  sodium chloride infusion ( Intravenous New Bag/Given 06/20/18 1532)  sodium chloride 0.9 % bolus 500 mL (0 mLs Intravenous Stopped 06/20/18 1841)  acetaminophen (TYLENOL) tablet 650 mg (650 mg Oral Given 06/20/18 1925)     Initial Impression / Assessment and Plan / ED Course  I have reviewed the triage vital signs and the nursing notes.  Pertinent labs & imaging results that were available during my care of the patient were reviewed by me and considered in my medical decision making (see chart for details).     Patient's work-up here showed the hemoglobin was fine.  BUN and creatinine slightly elevated.  Feel patient got dried out a little bit from the diuretics or lack of fluid intake.  Patient received  500 cc normal bolus of fluid here blood pressures came up above 100 and stayed there even with orthostatic pressures.  Asymptomatic.  Patient safe for discharge home but will need close follow-up this week for recheck of her renal function.  She will need to continue to have her blood pressures checked carefully at home.  Patient will return for any new or worse symptoms.  Final Clinical Impressions(s) / ED Diagnoses   Final diagnoses:  Hypotension, unspecified hypotension type  Dehydration    ED Discharge Orders    None       Fredia Sorrow, MD 06/20/18 [phone]

## 2018-06-20 NOTE — Discharge Instructions (Signed)
Work-up here today appears that you were dehydrated.  Some of this could be your diuretics or lack of adequate fluid intake.  Hemoglobin was fine.  Will need to follow-up with cardiology or your doctor next week for recheck of your renal function.  And recheck of your blood pressure.  With just a little bit of fluid here today blood pressure came up nicely to above 100.  Continue your current medications.  Return for any new or worse symptoms.

## 2018-06-20 NOTE — Telephone Encounter (Signed)
I called Ms Niday to set up an appointment. She stated she would be home all day and agreed t meet me this afternoon at 13:00.

## 2018-06-20 NOTE — Telephone Encounter (Signed)
Zack called to report pts bp of 73/36. Pt stated she is "a little tired"" but no other symptoms reported. Per Caryl Pina pt needs to go to the Emergency room to be evaluated. Pt and Zack aware and agreeable with plan

## 2018-06-20 NOTE — ED Notes (Signed)
Pt transported to xray 

## 2018-06-20 NOTE — ED Triage Notes (Signed)
Pt feeling weak today, had a well check from a community EMS. BP 78/56. Pt is a CHF patient. Pt has not been keeping up with fluid intake,but has been taking diuretics as prescribed. Denies feeling dizzy, CP, SOB. Pt states her lower leg edema is less than normal. Lungs clear per EMS. Pt is alert and oriented.

## 2018-06-20 NOTE — Progress Notes (Signed)
Paramedicine Encounter    Patient ID: Danielle [name], female    DOB: May 27, 1946, 73 y.o.   MRN: [id number]   Patient Care Team: Seward Carol, MD as PCP - General (Internal Medicine) Jorge Ny, LCSW as Social Worker (Licensed Clinical Social Worker)  Patient Active Problem List   Diagnosis Date Noted  . Encephalopathy acute 05/20/2018  . 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 Acute kidney failure ([place]) 05/15/2018  . Primary localized osteoarthritis of right knee 05/13/2018  . (HFpEF) heart failure with preserved ejection fraction ([place]) 12/20/2017  . Obesity (BMI 30-39.9) 11/11/2017  . OSA (obstructive sleep apnea) 02/18/2017  . Essential hypertension   . Diabetes mellitus without complication ([place])   . Cancer ([place])   . Thyroid disease    No current facility-administered medications for this visit.   Current Outpatient Medications:  .  allopurinol (ZYLOPRIM) 100 MG tablet, Take 1 tablet (100 mg total) by mouth daily., Disp: , Rfl:  .  apixaban (ELIQUIS) 5 MG TABS tablet, Take 1 tablet (5 mg total) by mouth 2 (two) times daily., Disp: 60 tablet, Rfl: 3 .  atorvastatin (LIPITOR) 80 MG tablet, Take 1 tablet (80 mg total) by mouth at bedtime., Disp: 30 tablet, Rfl: 11 .  baclofen (LIORESAL) 10 MG tablet, Take 1 tablet (10 mg total) by mouth 3 (three) times daily. As needed for muscle spasm (Patient not taking: Reported on 06/20/2018), Disp: 50 tablet, Rfl: 0 .  Biotin 10 MG CAPS, Take 10 mg by mouth daily., Disp: , Rfl:  .  carvedilol (COREG) 25 MG tablet, Take 1 tablet (25 mg total) by mouth 2 (two) times daily., Disp: 180 tablet, Rfl: 3 .  CHONDROITIN SULFATE PO, Take 250 mg by mouth daily., Disp: , Rfl:  .  empagliflozin (JARDIANCE) 10 MG TABS tablet, Take 10 mg by mouth daily., Disp: 30 tablet, Rfl: 11 .  ferrous sulfate 325 (65 FE) MG tablet, Take 325 mg by mouth 2 (two) times daily with a meal., Disp: , Rfl:  .  gabapentin (NEURONTIN) 300 MG  capsule, Take 300 mg by mouth daily. , Disp: , Rfl:  .  insulin glargine (LANTUS) 100 UNIT/ML injection, Inject 20 Units into the skin 2 (two) times daily after a meal. , Disp: , Rfl:  .  isosorbide mononitrate (IMDUR) 30 MG 24 hr tablet, Take 1 tablet (30 mg total) by mouth daily., Disp: 30 tablet, Rfl: 3 .  levothyroxine (SYNTHROID, LEVOTHROID) 75 MCG tablet, Take 75 mcg by mouth daily before breakfast., Disp: , Rfl:  .  metFORMIN (GLUCOPHAGE) 500 MG tablet, Take 500 mg by mouth 2 (two) times daily with a meal. , Disp: , Rfl:  .  Multiple Vitamins-Minerals (ALIVE ONCE DAILY WOMENS 50+ PO), Take 1 tablet by mouth daily., Disp: , Rfl:  .  omeprazole (PRILOSEC) 20 MG capsule, Take 20 mg by mouth daily. , Disp: , Rfl:  .  potassium chloride (K-DUR,KLOR-CON) 10 MEQ tablet, Take 2 tablets (20 mEq total) by mouth daily., Disp: 60 tablet, Rfl: 5 .  torsemide (DEMADEX) 20 MG tablet, Take 1 tablet (20 mg total) by mouth 2 (two) times daily., Disp: 60 tablet, Rfl: 11 .  acetaminophen (TYLENOL) 500 MG tablet, Take 500-1,000 mg by mouth every 6 (six) hours as needed (for pain or headaches)., Disp: , Rfl:  .  calcium citrate (CALCITRATE - DOSED IN MG ELEMENTAL CALCIUM) 950 MG tablet, Take 1 tablet by mouth 2 (two) times daily. , Disp: ,  Rfl:  .  HYDROcodone-acetaminophen (NORCO) 10-325 MG tablet, Take 1 tablet by mouth every 6 (six) hours as needed. (Patient not taking: Reported on 06/20/2018), Disp: 28 tablet, Rfl: 0 .  losartan (COZAAR) 50 MG tablet, TAKE 1 TABLET BY MOUTH ONCE DAILY (Patient taking differently: Take 50 mg by mouth daily. ), Disp: 30 tablet, Rfl: 0 .  nitroGLYCERIN (NITROSTAT) 0.4 MG SL tablet, DISSOLVE ONE TABLET UNDER THE TONGUE EVERY 5 MINUTES AS NEEDED FOR CHEST PAIN. (Patient taking differently: Place 0.4 mg under the tongue every 5 (five) minutes as needed for chest pain. ), Disp: 25 tablet, Rfl: 3 .  ondansetron (ZOFRAN) 4 MG tablet, Take 1 tablet (4 mg total) by mouth every 8 (eight)  hours as needed for nausea or vomiting. (Patient not taking: Reported on 06/20/2018), Disp: 10 tablet, Rfl: 0 .  predniSONE (DELTASONE) 20 MG tablet, Take 3 tablets once daily for 3 days, then take 2 tablets once daily for 3 days, then take 1 tablet once daily for 3 days, then STOP (Patient not taking: Reported on 06/20/2018), Disp: 18 tablet, Rfl: 0 .  sennosides-docusate sodium (SENOKOT-S) 8.6-50 MG tablet, Take 2 tablets by mouth daily. (Patient not taking: Reported on 06/20/2018), Disp: 30 tablet, Rfl: 1 .  spironolactone (ALDACTONE) 25 MG tablet, TAKE 1 TABLET BY MOUTH EVERY OTHER DAY (Patient taking differently: Take 25 mg by mouth every other day. ), Disp: 15 tablet, Rfl: 0 .  Travoprost, BAK Free, (TRAVATAN) 0.004 % SOLN ophthalmic solution, Place 1 drop into both eyes at bedtime. , Disp: , Rfl:   Facility-Administered Medications Ordered in Other Visits:  .  0.9 %  sodium chloride infusion, , Intravenous, Continuous, Fredia Sorrow, MD, Last Rate: 50 mL/hr at 06/20/18 1532 .  acetaminophen (TYLENOL) tablet 650 mg, 650 mg, Oral, Once, Fredia Sorrow, MD Allergies  Allergen Reactions  . Other Other (See Comments)    Blood products- Patient won't accept any  . Sulfa Antibiotics Rash  . Uloric [Febuxostat] Rash      Social History   Socioeconomic History  . Marital status: Widowed    Spouse name: Not on file  . Number of children: 3  . Years of education: Not on file  . Highest education level: Some college, no degree  Occupational History  . Occupation: retired  Scientific laboratory technician  . Financial resource strain: Not hard at all  . Food insecurity:    Worry: Never true    Inability: Never true  . Transportation needs:    Medical: No    Non-medical: No  Tobacco Use  . Smoking status: Former Smoker    Packs/day: 1.00    Years: 22.00    Pack years: 22.00    Types: Cigarettes    Last attempt to quit: 1983    Years since quitting: 37.0  . Smokeless tobacco: Never Used   Substance and Sexual Activity  . Alcohol use: Yes    Comment: 05/22/2018 "glass of wine couple times/wk"  . Drug use: Not Currently  . Sexual activity: Not Currently    Birth control/protection: None, Post-menopausal  Lifestyle  . Physical activity:    Days per week: Not on file    Minutes per session: Not on file  . Stress: Not on file  Relationships  . Social connections:    Talks on phone: Not on file    Gets together: Not on file    Attends religious service: Not on file    Active member of club or organization:  Not on file    Attends meetings of clubs or organizations: Not on file    Relationship status: Not on file  . Intimate partner violence:    Fear of current or ex partner: Not on file    Emotionally abused: Not on file    Physically abused: Not on file    Forced sexual activity: Not on file  Other Topics Concern  . Not on file  Social History Narrative  . Not on file    Physical Exam Cardiovascular:     Rate and Rhythm: Normal rate and regular rhythm.  Pulmonary:     Effort: Pulmonary effort is normal.     Breath sounds: Normal breath sounds.  Musculoskeletal: Normal range of motion.     Right lower leg: No edema.     Left lower leg: No edema.  Skin:    General: Skin is warm and dry.     Capillary Refill: Capillary refill takes less than 2 seconds.  Neurological:     Mental Status: She is alert and oriented to person, place, and time.  Psychiatric:        Mood and Affect: Mood normal.         Future Appointments  Date Time Provider [place]  08/07/2018 10:40 AM Bensimhon, Shaune Pascal, MD [facility] None  09/02/2018  8:35 AM [facility] DEVICE REMOTES [facility]    BP (!) 73/46 (BP Location: Right Arm, Patient Position: Sitting, Cuff Size: Normal)   Pulse 94   Resp 16   Wt 188 lb (85.3 kg)   SpO2 96%   BMI 32.27 kg/m   Weight yesterday- 188 lb Last visit weight- 186 lb  Ms Vey was seen at home today and reported feeling  well. She denied chest pain, SOB, headache, dizziness or orthopnea. She stated she has been compliant with her medications and her weight has been stable. Her medications were verified and her pillbox was refilled. Her only complaint today was a decreased appetite but upon assessment, she was found to be hypotensive. I contacted the HF clinic and they advised to have her sent to the ED for assessment. I contacted an ALS transport and she was send non-emergently to the hospital.   [name], EMT 06/20/18  ACTION: Unstable: HF Clinic called: transporting for emergent visit as directed by [place].

## 2018-06-26 DIAGNOSIS — M25461 Effusion, right knee: Secondary | ICD-10-CM | POA: Diagnosis not present

## 2018-06-26 DIAGNOSIS — M25661 Stiffness of right knee, not elsewhere classified: Secondary | ICD-10-CM | POA: Diagnosis not present

## 2018-06-26 DIAGNOSIS — M6281 Muscle weakness (generalized): Secondary | ICD-10-CM | POA: Diagnosis not present

## 2018-06-26 DIAGNOSIS — R262 Difficulty in walking, not elsewhere classified: Secondary | ICD-10-CM | POA: Diagnosis not present

## 2018-06-27 ENCOUNTER — Other Ambulatory Visit (HOSPITAL_COMMUNITY): Payer: Self-pay

## 2018-06-27 ENCOUNTER — Telehealth (HOSPITAL_COMMUNITY): Payer: Self-pay

## 2018-06-27 NOTE — Telephone Encounter (Signed)
I called pt to schedule [facility] visit for today.  She agrees to meet in 15 mins.

## 2018-06-27 NOTE — Progress Notes (Signed)
Paramedicine Encounter    Patient ID: Danielle [name], female    DOB: 1945/11/09, 73 y.o.   MRN: [id number]    Patient Care Team: Seward Carol, MD as PCP - General (Internal Medicine) Jorge Ny, LCSW as Social Worker (Licensed Clinical Social Worker)  Patient Active Problem List   Diagnosis Date Noted  . Encephalopathy acute 05/20/2018  . 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 Acute kidney failure ([place]) 05/15/2018  . Primary localized osteoarthritis of right knee 05/13/2018  . (HFpEF) heart failure with preserved ejection fraction ([place]) 12/20/2017  . Obesity (BMI 30-39.9) 11/11/2017  . OSA (obstructive sleep apnea) 02/18/2017  . Essential hypertension   . Diabetes mellitus without complication ([place])   . Cancer ([place])   . Thyroid disease     Current Outpatient Medications:  .  allopurinol (ZYLOPRIM) 100 MG tablet, Take 1 tablet (100 mg total) by mouth daily., Disp: , Rfl:  .  apixaban (ELIQUIS) 5 MG TABS tablet, Take 1 tablet (5 mg total) by mouth 2 (two) times daily., Disp: 60 tablet, Rfl: 3 .  atorvastatin (LIPITOR) 80 MG tablet, Take 1 tablet (80 mg total) by mouth at bedtime., Disp: 30 tablet, Rfl: 11 .  Biotin 10 MG CAPS, Take 10 mg by mouth daily., Disp: , Rfl:  .  calcium citrate (CALCITRATE - DOSED IN MG ELEMENTAL CALCIUM) 950 MG tablet, Take 1 tablet by mouth 2 (two) times daily. , Disp: , Rfl:  .  empagliflozin (JARDIANCE) 10 MG TABS tablet, Take 10 mg by mouth daily., Disp: 30 tablet, Rfl: 11 .  ferrous sulfate 325 (65 FE) MG tablet, Take 325 mg by mouth 2 (two) times daily with a meal., Disp: , Rfl:  .  gabapentin (NEURONTIN) 300 MG capsule, Take 300 mg by mouth daily. , Disp: , Rfl:  .  isosorbide mononitrate (IMDUR) 30 MG 24 hr tablet, Take 1 tablet (30 mg total) by mouth daily., Disp: 30 tablet, Rfl: 3 .  levothyroxine (SYNTHROID, LEVOTHROID) 75 MCG tablet, Take 75 mcg by mouth daily before breakfast., Disp: , Rfl:  .  losartan  (COZAAR) 50 MG tablet, TAKE 1 TABLET BY MOUTH ONCE DAILY (Patient taking differently: Take 50 mg by mouth daily. ), Disp: 30 tablet, Rfl: 0 .  metFORMIN (GLUCOPHAGE) 500 MG tablet, Take 500 mg by mouth 2 (two) times daily with a meal. , Disp: , Rfl:  .  omeprazole (PRILOSEC) 20 MG capsule, Take 20 mg by mouth daily. , Disp: , Rfl:  .  potassium chloride (K-DUR,KLOR-CON) 10 MEQ tablet, Take 2 tablets (20 mEq total) by mouth daily., Disp: 60 tablet, Rfl: 5 .  spironolactone (ALDACTONE) 25 MG tablet, TAKE 1 TABLET BY MOUTH EVERY OTHER DAY (Patient taking differently: Take 25 mg by mouth every other day. ), Disp: 15 tablet, Rfl: 0 .  torsemide (DEMADEX) 20 MG tablet, Take 1 tablet (20 mg total) by mouth 2 (two) times daily., Disp: 60 tablet, Rfl: 11 .  acetaminophen (TYLENOL) 500 MG tablet, Take 500-1,000 mg by mouth every 6 (six) hours as needed (for pain or headaches)., Disp: , Rfl:  .  baclofen (LIORESAL) 10 MG tablet, Take 1 tablet (10 mg total) by mouth 3 (three) times daily. As needed for muscle spasm (Patient not taking: Reported on 06/20/2018), Disp: 50 tablet, Rfl: 0 .  carvedilol (COREG) 25 MG tablet, Take 1 tablet (25 mg total) by mouth 2 (two) times daily., Disp: 180 tablet, Rfl: 3 .  CHONDROITIN SULFATE  PO, Take 250 mg by mouth daily., Disp: , Rfl:  .  HYDROcodone-acetaminophen (NORCO) 10-325 MG tablet, Take 1 tablet by mouth every 6 (six) hours as needed. (Patient not taking: Reported on 06/20/2018), Disp: 28 tablet, Rfl: 0 .  insulin glargine (LANTUS) 100 UNIT/ML injection, Inject 20 Units into the skin 2 (two) times daily after a meal. , Disp: , Rfl:  .  Multiple Vitamins-Minerals (ALIVE ONCE DAILY WOMENS 50+ PO), Take 1 tablet by mouth daily., Disp: , Rfl:  .  nitroGLYCERIN (NITROSTAT) 0.4 MG SL tablet, DISSOLVE ONE TABLET UNDER THE TONGUE EVERY 5 MINUTES AS NEEDED FOR CHEST PAIN. (Patient taking differently: Place 0.4 mg under the tongue every 5 (five) minutes as needed for chest pain. ),  Disp: 25 tablet, Rfl: 3 .  ondansetron (ZOFRAN) 4 MG tablet, Take 1 tablet (4 mg total) by mouth every 8 (eight) hours as needed for nausea or vomiting. (Patient not taking: Reported on 06/20/2018), Disp: 10 tablet, Rfl: 0 .  predniSONE (DELTASONE) 20 MG tablet, Take 3 tablets once daily for 3 days, then take 2 tablets once daily for 3 days, then take 1 tablet once daily for 3 days, then STOP (Patient not taking: Reported on 06/20/2018), Disp: 18 tablet, Rfl: 0 .  sennosides-docusate sodium (SENOKOT-S) 8.6-50 MG tablet, Take 2 tablets by mouth daily. (Patient not taking: Reported on 06/20/2018), Disp: 30 tablet, Rfl: 1 .  Travoprost, BAK Free, (TRAVATAN) 0.004 % SOLN ophthalmic solution, Place 1 drop into both eyes at bedtime. , Disp: , Rfl:  Allergies  Allergen Reactions  . Other Other (See Comments)    Blood products- Patient won't accept any  . Sulfa Antibiotics Rash  . Uloric [Febuxostat] Rash     Social History   Socioeconomic History  . Marital status: Widowed    Spouse name: Not on file  . Number of children: 3  . Years of education: Not on file  . Highest education level: Some college, no degree  Occupational History  . Occupation: retired  Scientific laboratory technician  . Financial resource strain: Not hard at all  . Food insecurity:    Worry: Never true    Inability: Never true  . Transportation needs:    Medical: No    Non-medical: No  Tobacco Use  . Smoking status: Former Smoker    Packs/day: 1.00    Years: 22.00    Pack years: 22.00    Types: Cigarettes    Last attempt to quit: 1983    Years since quitting: 37.1  . Smokeless tobacco: Never Used  Substance and Sexual Activity  . Alcohol use: Yes    Comment: 05/22/2018 "glass of wine couple times/wk"  . Drug use: Not Currently  . Sexual activity: Not Currently    Birth control/protection: None, Post-menopausal  Lifestyle  . Physical activity:    Days per week: Not on file    Minutes per session: Not on file  . Stress: Not on  file  Relationships  . Social connections:    Talks on phone: Not on file    Gets together: Not on file    Attends religious service: Not on file    Active member of club or organization: Not on file    Attends meetings of clubs or organizations: Not on file    Relationship status: Not on file  . Intimate partner violence:    Fear of current or ex partner: Not on file    Emotionally abused: Not on file  Physically abused: Not on file    Forced sexual activity: Not on file  Other Topics Concern  . Not on file  Social History Narrative  . Not on file    Physical Exam Pulmonary:     Effort: Pulmonary effort is normal. No respiratory distress.     Breath sounds: No wheezing.  Abdominal:     Tenderness: There is no abdominal tenderness. There is no guarding.  Skin:    General: Skin is warm and dry.         Future Appointments  Date Time Provider [place]  08/07/2018 10:40 AM Bensimhon, Shaune Pascal, MD [facility] None  09/02/2018  8:35 AM [facility] DEVICE REMOTES [facility]     BP (!) 82/50   Pulse 81   Wt 189 lb (85.7 kg)   SpO2 99%   BMI 32.44 kg/m  cbg 201  Weight yesterday-189 Last visit weight-188  ATF pt CAO x4 sitting in her living room. She stated that she hasn't been sleeping well at night, she just woke up from a nap.  She denies sob, chest pain and dizziness. Pt stated that went to the ED last week because her blood pressure was low.  During our visit the RN from her pcp called to f/u with pt about her last week ED visit.    Pt has taken her medications for today. She stated that she's been taking the second dose of torsemide "every other day" due to her blood pressure issues. Pt is still hypotensive today during my assessment. rx bottles verified and pill box refilled.   I spoke with [employer] with [facility] to inform them of pt's BP.  She requested that if Danielle [name] started to feel dizzy, lightheadedness for her  to call 911. Danielle [name] stated that if pt can't get a blood pressure cuff from her pcp, then tell zack.  Pt will need to record her BP.    Medication ordered: Gabapentin (out of refills/Dr. Delfina Redwood)  [place] Danielle [name], EMT Paramedic [phone] 06/27/2018    ACTION: Home visit completed

## 2018-06-28 DIAGNOSIS — G4733 Obstructive sleep apnea (adult) (pediatric): Secondary | ICD-10-CM | POA: Diagnosis not present

## 2018-07-03 DIAGNOSIS — N183 Chronic kidney disease, stage 3 (moderate): Secondary | ICD-10-CM | POA: Diagnosis not present

## 2018-07-03 DIAGNOSIS — E1122 Type 2 diabetes mellitus with diabetic chronic kidney disease: Secondary | ICD-10-CM | POA: Diagnosis not present

## 2018-07-03 DIAGNOSIS — I10 Essential (primary) hypertension: Secondary | ICD-10-CM | POA: Diagnosis not present

## 2018-07-03 DIAGNOSIS — R197 Diarrhea, unspecified: Secondary | ICD-10-CM | POA: Diagnosis not present

## 2018-07-04 ENCOUNTER — Telehealth (HOSPITAL_COMMUNITY): Payer: Self-pay

## 2018-07-04 ENCOUNTER — Other Ambulatory Visit (HOSPITAL_COMMUNITY): Payer: Self-pay

## 2018-07-04 NOTE — Telephone Encounter (Signed)
[place] notified.

## 2018-07-04 NOTE — Progress Notes (Signed)
Paramedicine Encounter    Patient ID: Danielle [name], female    DOB: 1945/09/18, 73 y.o.   MRN: [id number]   Patient Care Team: Seward Carol, MD as PCP - General (Internal Medicine) Jorge Ny, LCSW as Social Worker (Licensed Clinical Social Worker)  Patient Active Problem List   Diagnosis Date Noted  . Encephalopathy acute 05/20/2018  . 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 Acute kidney failure ([place]) 05/15/2018  . Primary localized osteoarthritis of right knee 05/13/2018  . (HFpEF) heart failure with preserved ejection fraction ([place]) 12/20/2017  . Obesity (BMI 30-39.9) 11/11/2017  . OSA (obstructive sleep apnea) 02/18/2017  . Essential hypertension   . Diabetes mellitus without complication ([place])   . Cancer ([place])   . Thyroid disease     Current Outpatient Medications:  .  allopurinol (ZYLOPRIM) 100 MG tablet, Take 1 tablet (100 mg total) by mouth daily., Disp: , Rfl:  .  apixaban (ELIQUIS) 5 MG TABS tablet, Take 1 tablet (5 mg total) by mouth 2 (two) times daily., Disp: 60 tablet, Rfl: 3 .  atorvastatin (LIPITOR) 80 MG tablet, Take 1 tablet (80 mg total) by mouth at bedtime., Disp: 30 tablet, Rfl: 11 .  Biotin 10 MG CAPS, Take 10 mg by mouth daily., Disp: , Rfl:  .  carvedilol (COREG) 25 MG tablet, Take 1 tablet (25 mg total) by mouth 2 (two) times daily., Disp: 180 tablet, Rfl: 3 .  CHONDROITIN SULFATE PO, Take 250 mg by mouth daily., Disp: , Rfl:  .  empagliflozin (JARDIANCE) 10 MG TABS tablet, Take 10 mg by mouth daily., Disp: 30 tablet, Rfl: 11 .  ferrous sulfate 325 (65 FE) MG tablet, Take 325 mg by mouth 2 (two) times daily with a meal., Disp: , Rfl:  .  gabapentin (NEURONTIN) 300 MG capsule, Take 300 mg by mouth daily. , Disp: , Rfl:  .  insulin glargine (LANTUS) 100 UNIT/ML injection, Inject 20 Units into the skin 2 (two) times daily after a meal. , Disp: , Rfl:  .  isosorbide mononitrate (IMDUR) 30 MG 24 hr tablet, Take 1 tablet (30 mg  total) by mouth daily., Disp: 30 tablet, Rfl: 3 .  levothyroxine (SYNTHROID, LEVOTHROID) 75 MCG tablet, Take 75 mcg by mouth daily before breakfast., Disp: , Rfl:  .  losartan (COZAAR) 50 MG tablet, TAKE 1 TABLET BY MOUTH ONCE DAILY (Patient taking differently: Take 50 mg by mouth daily. ), Disp: 30 tablet, Rfl: 0 .  Multiple Vitamins-Minerals (ALIVE ONCE DAILY WOMENS 50+ PO), Take 1 tablet by mouth daily., Disp: , Rfl:  .  omeprazole (PRILOSEC) 20 MG capsule, Take 20 mg by mouth daily. , Disp: , Rfl:  .  potassium chloride (K-DUR,KLOR-CON) 10 MEQ tablet, Take 2 tablets (20 mEq total) by mouth daily., Disp: 60 tablet, Rfl: 5 .  spironolactone (ALDACTONE) 25 MG tablet, TAKE 1 TABLET BY MOUTH EVERY OTHER DAY (Patient taking differently: Take 25 mg by mouth every other day. ), Disp: 15 tablet, Rfl: 0 .  torsemide (DEMADEX) 20 MG tablet, Take 1 tablet (20 mg total) by mouth 2 (two) times daily., Disp: 60 tablet, Rfl: 11 .  Travoprost, BAK Free, (TRAVATAN) 0.004 % SOLN ophthalmic solution, Place 1 drop into both eyes at bedtime. , Disp: , Rfl:  .  acetaminophen (TYLENOL) 500 MG tablet, Take 500-1,000 mg by mouth every 6 (six) hours as needed (for pain or headaches)., Disp: , Rfl:  .  baclofen (LIORESAL) 10 MG tablet,  Take 1 tablet (10 mg total) by mouth 3 (three) times daily. As needed for muscle spasm (Patient not taking: Reported on 06/20/2018), Disp: 50 tablet, Rfl: 0 .  calcium citrate (CALCITRATE - DOSED IN MG ELEMENTAL CALCIUM) 950 MG tablet, Take 1 tablet by mouth 2 (two) times daily. , Disp: , Rfl:  .  HYDROcodone-acetaminophen (NORCO) 10-325 MG tablet, Take 1 tablet by mouth every 6 (six) hours as needed. (Patient not taking: Reported on 06/20/2018), Disp: 28 tablet, Rfl: 0 .  metFORMIN (GLUCOPHAGE) 500 MG tablet, Take 500 mg by mouth 2 (two) times daily with a meal. , Disp: , Rfl:  .  nitroGLYCERIN (NITROSTAT) 0.4 MG SL tablet, DISSOLVE ONE TABLET UNDER THE TONGUE EVERY 5 MINUTES AS NEEDED FOR CHEST  PAIN. (Patient not taking: No sig reported), Disp: 25 tablet, Rfl: 3 .  ondansetron (ZOFRAN) 4 MG tablet, Take 1 tablet (4 mg total) by mouth every 8 (eight) hours as needed for nausea or vomiting. (Patient not taking: Reported on 07/04/2018), Disp: 10 tablet, Rfl: 0 .  predniSONE (DELTASONE) 20 MG tablet, Take 3 tablets once daily for 3 days, then take 2 tablets once daily for 3 days, then take 1 tablet once daily for 3 days, then STOP (Patient not taking: Reported on 06/20/2018), Disp: 18 tablet, Rfl: 0 .  sennosides-docusate sodium (SENOKOT-S) 8.6-50 MG tablet, Take 2 tablets by mouth daily. (Patient not taking: Reported on 06/20/2018), Disp: 30 tablet, Rfl: 1 Allergies  Allergen Reactions  . Other Other (See Comments)    Blood products- Patient won't accept any  . Sulfa Antibiotics Rash  . Uloric [Febuxostat] Rash      Social History   Socioeconomic History  . Marital status: Widowed    Spouse name: Not on file  . Number of children: 3  . Years of education: Not on file  . Highest education level: Some college, no degree  Occupational History  . Occupation: retired  Scientific laboratory technician  . Financial resource strain: Not hard at all  . Food insecurity:    Worry: Never true    Inability: Never true  . Transportation needs:    Medical: No    Non-medical: No  Tobacco Use  . Smoking status: Former Smoker    Packs/day: 1.00    Years: 22.00    Pack years: 22.00    Types: Cigarettes    Last attempt to quit: 1983    Years since quitting: 37.1  . Smokeless tobacco: Never Used  Substance and Sexual Activity  . Alcohol use: Yes    Comment: 05/22/2018 "glass of wine couple times/wk"  . Drug use: Not Currently  . Sexual activity: Not Currently    Birth control/protection: None, Post-menopausal  Lifestyle  . Physical activity:    Days per week: Not on file    Minutes per session: Not on file  . Stress: Not on file  Relationships  . Social connections:    Talks on phone: Not on file     Gets together: Not on file    Attends religious service: Not on file    Active member of club or organization: Not on file    Attends meetings of clubs or organizations: Not on file    Relationship status: Not on file  . Intimate partner violence:    Fear of current or ex partner: Not on file    Emotionally abused: Not on file    Physically abused: Not on file    Forced sexual activity: Not  on file  Other Topics Concern  . Not on file  Social History Narrative  . Not on file    Physical Exam Cardiovascular:     Rate and Rhythm: Normal rate and regular rhythm.     Pulses: Normal pulses.  Pulmonary:     Effort: Pulmonary effort is normal.     Breath sounds: Normal breath sounds.  Musculoskeletal: Normal range of motion.     Right lower leg: Edema present.     Left lower leg: No edema.  Skin:    General: Skin is warm and dry.     Capillary Refill: Capillary refill takes less than 2 seconds.  Neurological:     Mental Status: She is alert and oriented to person, place, and time.  Psychiatric:        Mood and Affect: Mood normal.         Future Appointments  Date Time Provider [place]  08/07/2018 10:40 AM Bensimhon, Shaune Pascal, MD [facility] None  09/02/2018  8:35 AM [facility] DEVICE REMOTES [facility]    BP (!) 92/52 (BP Location: Right Arm, Patient Position: Sitting, Cuff Size: Large)   Pulse 82   Resp 16   Wt 195 lb (88.5 kg)   SpO2 95%   BMI 33.47 kg/m   Weight yesterday- 197 lb Last visit weight- 188 lb  Ms Pauls was seen at home today and reported feeling well. She reported having an episode of chest discomfort around 10:00 this morning which led her to taking a SL nitroglycerine. The pain resolved following a belch which she stated occurred within a couple of minutes of minutes of taking the NTG. She denied SOB, headache, or dizziness associated with this episode. She denied increased orthopnea. Her weight increased significantly since last  week but she remains hypotensive at 90/50 mmHg. I contacted the HF clinic who advised to have her take an extra 20 mg of torsemide tonight and tomorrow morning. Her medications were verified and her pillbox was refilled with the necessary changes.   [name], EMT 07/04/18  ACTION: Home visit completed Next visit planned for 1 week

## 2018-07-04 NOTE — Telephone Encounter (Signed)
Thedore Mins called to report that pt has had a significant wt gain in the last week: 188 to 195 lbs and is hypotension with a bp of 92/52. No other symptoms to report. Pt states she feels fine. Pt is currently taking torsemide 20 mg (Take 1 tablet (20 mg total) by mouth 2 (two) times daily). Please advise.

## 2018-07-04 NOTE — Telephone Encounter (Signed)
Please call and ask her to take an extra torsemide 20 mg for the next 2 days.   [name] Clegg Np-C  1:02 PM

## 2018-07-11 ENCOUNTER — Other Ambulatory Visit (HOSPITAL_COMMUNITY): Payer: Self-pay

## 2018-07-11 ENCOUNTER — Telehealth (HOSPITAL_COMMUNITY): Payer: Self-pay

## 2018-07-11 ENCOUNTER — Other Ambulatory Visit (HOSPITAL_COMMUNITY): Payer: Self-pay | Admitting: Internal Medicine

## 2018-07-11 NOTE — Progress Notes (Signed)
Paramedicine Encounter    Patient ID: Danielle [name], female    DOB: 1945-12-30, 73 y.o.   MRN: [id number]   Patient Care Team: Seward Carol, MD as PCP - General (Internal Medicine) Jorge Ny, LCSW as Social Worker (Licensed Clinical Social Worker)  Patient Active Problem List   Diagnosis Date Noted  . Encephalopathy acute 05/20/2018  . 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 Acute kidney failure ([place]) 05/15/2018  . Primary localized osteoarthritis of right knee 05/13/2018  . (HFpEF) heart failure with preserved ejection fraction ([place]) 12/20/2017  . Obesity (BMI 30-39.9) 11/11/2017  . OSA (obstructive sleep apnea) 02/18/2017  . Essential hypertension   . Diabetes mellitus without complication ([place])   . Cancer ([place])   . Thyroid disease     Current Outpatient Medications:  .  allopurinol (ZYLOPRIM) 100 MG tablet, Take 1 tablet (100 mg total) by mouth daily., Disp: , Rfl:  .  apixaban (ELIQUIS) 5 MG TABS tablet, Take 1 tablet (5 mg total) by mouth 2 (two) times daily., Disp: 60 tablet, Rfl: 3 .  atorvastatin (LIPITOR) 80 MG tablet, Take 1 tablet (80 mg total) by mouth at bedtime., Disp: 30 tablet, Rfl: 11 .  Biotin 10 MG CAPS, Take 10 mg by mouth daily., Disp: , Rfl:  .  carvedilol (COREG) 25 MG tablet, Take 1 tablet (25 mg total) by mouth 2 (two) times daily., Disp: 180 tablet, Rfl: 3 .  empagliflozin (JARDIANCE) 10 MG TABS tablet, Take 10 mg by mouth daily., Disp: 30 tablet, Rfl: 11 .  ferrous sulfate 325 (65 FE) MG tablet, Take 325 mg by mouth 2 (two) times daily with a meal., Disp: , Rfl:  .  insulin glargine (LANTUS) 100 UNIT/ML injection, Inject 20 Units into the skin 2 (two) times daily after a meal. , Disp: , Rfl:  .  isosorbide mononitrate (IMDUR) 30 MG 24 hr tablet, Take 1 tablet (30 mg total) by mouth daily., Disp: 30 tablet, Rfl: 3 .  levothyroxine (SYNTHROID, LEVOTHROID) 75 MCG tablet, Take 75 mcg by mouth daily before breakfast., Disp: ,  Rfl:  .  losartan (COZAAR) 50 MG tablet, TAKE 1 TABLET BY MOUTH ONCE DAILY (Patient taking differently: Take 50 mg by mouth daily. ), Disp: 30 tablet, Rfl: 0 .  metFORMIN (GLUCOPHAGE) 500 MG tablet, Take 500 mg by mouth 2 (two) times daily with a meal. , Disp: , Rfl:  .  Multiple Vitamins-Minerals (ALIVE ONCE DAILY WOMENS 50+ PO), Take 1 tablet by mouth daily., Disp: , Rfl:  .  omeprazole (PRILOSEC) 20 MG capsule, Take 20 mg by mouth daily. , Disp: , Rfl:  .  potassium chloride (K-DUR,KLOR-CON) 10 MEQ tablet, Take 2 tablets (20 mEq total) by mouth daily., Disp: 60 tablet, Rfl: 5 .  spironolactone (ALDACTONE) 25 MG tablet, TAKE 1 TABLET BY MOUTH EVERY OTHER DAY, Disp: 15 tablet, Rfl: 0 .  torsemide (DEMADEX) 20 MG tablet, Take 1 tablet (20 mg total) by mouth 2 (two) times daily., Disp: 60 tablet, Rfl: 11 .  Travoprost, BAK Free, (TRAVATAN) 0.004 % SOLN ophthalmic solution, Place 1 drop into both eyes at bedtime. , Disp: , Rfl:  .  acetaminophen (TYLENOL) 500 MG tablet, Take 500-1,000 mg by mouth every 6 (six) hours as needed (for pain or headaches)., Disp: , Rfl:  .  baclofen (LIORESAL) 10 MG tablet, Take 1 tablet (10 mg total) by mouth 3 (three) times daily. As needed for muscle spasm (Patient not taking: Reported  on 06/20/2018), Disp: 50 tablet, Rfl: 0 .  calcium citrate (CALCITRATE - DOSED IN MG ELEMENTAL CALCIUM) 950 MG tablet, Take 1 tablet by mouth 2 (two) times daily. , Disp: , Rfl:  .  CHONDROITIN SULFATE PO, Take 250 mg by mouth daily., Disp: , Rfl:  .  gabapentin (NEURONTIN) 300 MG capsule, Take 300 mg by mouth daily. , Disp: , Rfl:  .  HYDROcodone-acetaminophen (NORCO) 10-325 MG tablet, Take 1 tablet by mouth every 6 (six) hours as needed. (Patient not taking: Reported on 06/20/2018), Disp: 28 tablet, Rfl: 0 .  nitroGLYCERIN (NITROSTAT) 0.4 MG SL tablet, DISSOLVE ONE TABLET UNDER THE TONGUE EVERY 5 MINUTES AS NEEDED FOR CHEST PAIN. (Patient not taking: No sig reported), Disp: 25 tablet, Rfl:  3 .  ondansetron (ZOFRAN) 4 MG tablet, Take 1 tablet (4 mg total) by mouth every 8 (eight) hours as needed for nausea or vomiting. (Patient not taking: Reported on 07/04/2018), Disp: 10 tablet, Rfl: 0 .  predniSONE (DELTASONE) 20 MG tablet, Take 3 tablets once daily for 3 days, then take 2 tablets once daily for 3 days, then take 1 tablet once daily for 3 days, then STOP (Patient not taking: Reported on 06/20/2018), Disp: 18 tablet, Rfl: 0 .  sennosides-docusate sodium (SENOKOT-S) 8.6-50 MG tablet, Take 2 tablets by mouth daily. (Patient not taking: Reported on 06/20/2018), Disp: 30 tablet, Rfl: 1 Allergies  Allergen Reactions  . Other Other (See Comments)    Blood products- Patient won't accept any  . Sulfa Antibiotics Rash  . Uloric [Febuxostat] Rash      Social History   Socioeconomic History  . Marital status: Widowed    Spouse name: Not on file  . Number of children: 3  . Years of education: Not on file  . Highest education level: Some college, no degree  Occupational History  . Occupation: retired  Scientific laboratory technician  . Financial resource strain: Not hard at all  . Food insecurity:    Worry: Never true    Inability: Never true  . Transportation needs:    Medical: No    Non-medical: No  Tobacco Use  . Smoking status: Former Smoker    Packs/day: 1.00    Years: 22.00    Pack years: 22.00    Types: Cigarettes    Last attempt to quit: 1983    Years since quitting: 37.1  . Smokeless tobacco: Never Used  Substance and Sexual Activity  . Alcohol use: Yes    Comment: 05/22/2018 "glass of wine couple times/wk"  . Drug use: Not Currently  . Sexual activity: Not Currently    Birth control/protection: None, Post-menopausal  Lifestyle  . Physical activity:    Days per week: Not on file    Minutes per session: Not on file  . Stress: Not on file  Relationships  . Social connections:    Talks on phone: Not on file    Gets together: Not on file    Attends religious service: Not on  file    Active member of club or organization: Not on file    Attends meetings of clubs or organizations: Not on file    Relationship status: Not on file  . Intimate partner violence:    Fear of current or ex partner: Not on file    Emotionally abused: Not on file    Physically abused: Not on file    Forced sexual activity: Not on file  Other Topics Concern  . Not on file  Social History Narrative  . Not on file    Physical Exam Cardiovascular:     Rate and Rhythm: Normal rate and regular rhythm.     Pulses: Normal pulses.  Pulmonary:     Effort: Pulmonary effort is normal.     Breath sounds: Normal breath sounds.  Musculoskeletal: Normal range of motion.     Right lower leg: Edema present.     Left lower leg: No edema.  Skin:    General: Skin is warm and dry.     Capillary Refill: Capillary refill takes less than 2 seconds.  Neurological:     Mental Status: She is alert and oriented to person, place, and time.  Psychiatric:        Mood and Affect: Mood normal.         Future Appointments  Date Time Provider [place]  08/07/2018 10:40 AM Bensimhon, Shaune Pascal, MD [facility] None  09/02/2018  8:35 AM [facility] DEVICE REMOTES [facility]    BP 106/60 (BP Location: Right Arm, Patient Position: Sitting, Cuff Size: Normal)   Pulse 70   Resp 18   Wt 191 lb (86.6 kg)   SpO2 96%   BMI 32.79 kg/m   Weight yesterday- 191 lb Last visit weight- 195 lb  Ms Valdivia was seen at home and reported feeling well. She denied chest pain, SOB, dizziness or orthopnea. She has had some headaches but attributed those to a sinus problem. She stated she has been compliant with her medications over the past week and her weight has decreased by 4 pounds. Her medications were verified and her pillbox was refilled. Spironolactone, iron, isosorbide, torsemide, levothyroxine and Eliquis were ordered from the pharmacy. She was left with written instructions on where to add Eliquis  and isosorbide which she read and expressed understanding.   [name], EMT 07/11/18  ACTION: Home visit completed Next visit planned for 1 week

## 2018-07-11 NOTE — Telephone Encounter (Signed)
I called Danielle [name] to see if she was available for our normal Friday appointment. She did not answer so I left a message asking if I could come around 10:00 and requested she call me back to confirm.

## 2018-07-14 ENCOUNTER — Other Ambulatory Visit: Payer: Self-pay | Admitting: Cardiology

## 2018-07-16 DIAGNOSIS — M25461 Effusion, right knee: Secondary | ICD-10-CM | POA: Diagnosis not present

## 2018-07-18 ENCOUNTER — Other Ambulatory Visit (HOSPITAL_COMMUNITY): Payer: Self-pay

## 2018-07-18 ENCOUNTER — Telehealth (HOSPITAL_COMMUNITY): Payer: Self-pay

## 2018-07-18 ENCOUNTER — Other Ambulatory Visit (HOSPITAL_COMMUNITY): Payer: Self-pay | Admitting: Internal Medicine

## 2018-07-18 MED ORDER — TORSEMIDE 20 MG PO TABS: 20.0000 mg | ORAL_TABLET | Freq: Every day | ORAL | 3 refills | Status: DC

## 2018-07-18 MED ORDER — POTASSIUM CHLORIDE CRYS ER 10 MEQ PO TBCR: 10.0000 meq | EXTENDED_RELEASE_TABLET | Freq: Every day | ORAL | 3 refills | Status: DC

## 2018-07-18 NOTE — Telephone Encounter (Signed)
Thedore Mins called to report that pt's bp was 82/42 with some dizziness with standing. Pt is currently taking torsemide 20 mg Take 1 tablet (20 mg total) by mouth 2 (two) times daily).  Per [name], she wants pt to hold her torsemide and K for 2 days, then decrease to 20 mg of torsemide daily and 10 meq of K daily.

## 2018-07-18 NOTE — Progress Notes (Signed)
Paramedicine Encounter    Patient ID: Danielle [name], female    DOB: 03-Mar-1946, 73 y.o.   MRN: [id number]   Patient Care Team: Seward Carol, MD as PCP - General (Internal Medicine) Jorge Ny, LCSW as Social Worker (Licensed Clinical Social Worker)  Patient Active Problem List   Diagnosis Date Noted  . Encephalopathy acute 05/20/2018  . 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 Acute kidney failure ([place]) 05/15/2018  . Primary localized osteoarthritis of right knee 05/13/2018  . (HFpEF) heart failure with preserved ejection fraction ([place]) 12/20/2017  . Obesity (BMI 30-39.9) 11/11/2017  . OSA (obstructive sleep apnea) 02/18/2017  . Essential hypertension   . Diabetes mellitus without complication ([place])   . Cancer ([place])   . Thyroid disease     Current Outpatient Medications:  .  allopurinol (ZYLOPRIM) 100 MG tablet, Take 1 tablet (100 mg total) by mouth daily., Disp: , Rfl:  .  apixaban (ELIQUIS) 5 MG TABS tablet, Take 1 tablet (5 mg total) by mouth 2 (two) times daily., Disp: 60 tablet, Rfl: 3 .  atorvastatin (LIPITOR) 80 MG tablet, Take 1 tablet (80 mg total) by mouth at bedtime., Disp: 30 tablet, Rfl: 11 .  Biotin 10 MG CAPS, Take 10 mg by mouth daily., Disp: , Rfl:  .  carvedilol (COREG) 25 MG tablet, Take 1 tablet (25 mg total) by mouth 2 (two) times daily., Disp: 180 tablet, Rfl: 3 .  CHONDROITIN SULFATE PO, Take 250 mg by mouth daily., Disp: , Rfl:  .  empagliflozin (JARDIANCE) 10 MG TABS tablet, Take 10 mg by mouth daily., Disp: 30 tablet, Rfl: 11 .  ferrous sulfate 325 (65 FE) MG tablet, Take 325 mg by mouth 2 (two) times daily with a meal., Disp: , Rfl:  .  levothyroxine (SYNTHROID, LEVOTHROID) 75 MCG tablet, Take 75 mcg by mouth daily before breakfast., Disp: , Rfl:  .  losartan (COZAAR) 50 MG tablet, TAKE 1 TABLET BY MOUTH ONCE DAILY (Patient taking differently: Take 50 mg by mouth daily. ), Disp: 30 tablet, Rfl: 0 .  Multiple  Vitamins-Minerals (ALIVE ONCE DAILY WOMENS 50+ PO), Take 1 tablet by mouth daily., Disp: , Rfl:  .  omeprazole (PRILOSEC) 20 MG capsule, Take 20 mg by mouth daily. , Disp: , Rfl:  .  potassium chloride (K-DUR,KLOR-CON) 10 MEQ tablet, Take 2 tablets (20 mEq total) by mouth daily., Disp: 60 tablet, Rfl: 5 .  spironolactone (ALDACTONE) 25 MG tablet, TAKE 1 TABLET BY MOUTH EVERY OTHER DAY, Disp: 15 tablet, Rfl: 0 .  torsemide (DEMADEX) 20 MG tablet, Take 1 tablet (20 mg total) by mouth 2 (two) times daily., Disp: 60 tablet, Rfl: 11 .  Travoprost, BAK Free, (TRAVATAN) 0.004 % SOLN ophthalmic solution, Place 1 drop into both eyes at bedtime. , Disp: , Rfl:  .  acetaminophen (TYLENOL) 500 MG tablet, Take 500-1,000 mg by mouth every 6 (six) hours as needed (for pain or headaches)., Disp: , Rfl:  .  baclofen (LIORESAL) 10 MG tablet, Take 1 tablet (10 mg total) by mouth 3 (three) times daily. As needed for muscle spasm (Patient not taking: Reported on 06/20/2018), Disp: 50 tablet, Rfl: 0 .  calcium citrate (CALCITRATE - DOSED IN MG ELEMENTAL CALCIUM) 950 MG tablet, Take 1 tablet by mouth 2 (two) times daily. , Disp: , Rfl:  .  gabapentin (NEURONTIN) 300 MG capsule, Take 300 mg by mouth daily. , Disp: , Rfl:  .  HYDROcodone-acetaminophen (NORCO) 10-325 MG  tablet, Take 1 tablet by mouth every 6 (six) hours as needed. (Patient not taking: Reported on 06/20/2018), Disp: 28 tablet, Rfl: 0 .  insulin glargine (LANTUS) 100 UNIT/ML injection, Inject 20 Units into the skin 2 (two) times daily after a meal. , Disp: , Rfl:  .  isosorbide mononitrate (IMDUR) 30 MG 24 hr tablet, Take 1 tablet (30 mg total) by mouth daily., Disp: 30 tablet, Rfl: 3 .  metFORMIN (GLUCOPHAGE) 500 MG tablet, Take 500 mg by mouth 2 (two) times daily with a meal. , Disp: , Rfl:  .  nitroGLYCERIN (NITROSTAT) 0.4 MG SL tablet, DISSOLVE ONE TABLET UNDER THE TONGUE EVERY 5 MINUTES AS NEEDED FOR CHEST PAIN. (Patient not taking: No sig reported), Disp: 25  tablet, Rfl: 3 .  ondansetron (ZOFRAN) 4 MG tablet, Take 1 tablet (4 mg total) by mouth every 8 (eight) hours as needed for nausea or vomiting. (Patient not taking: Reported on 07/04/2018), Disp: 10 tablet, Rfl: 0 .  predniSONE (DELTASONE) 20 MG tablet, Take 3 tablets once daily for 3 days, then take 2 tablets once daily for 3 days, then take 1 tablet once daily for 3 days, then STOP (Patient not taking: Reported on 06/20/2018), Disp: 18 tablet, Rfl: 0 .  sennosides-docusate sodium (SENOKOT-S) 8.6-50 MG tablet, Take 2 tablets by mouth daily. (Patient not taking: Reported on 06/20/2018), Disp: 30 tablet, Rfl: 1 Allergies  Allergen Reactions  . Other Other (See Comments)    Blood products- Patient won't accept any  . Sulfa Antibiotics Rash  . Uloric [Febuxostat] Rash      Social History   Socioeconomic History  . Marital status: Widowed    Spouse name: Not on file  . Number of children: 3  . Years of education: Not on file  . Highest education level: Some college, no degree  Occupational History  . Occupation: retired  Scientific laboratory technician  . Financial resource strain: Not hard at all  . Food insecurity:    Worry: Never true    Inability: Never true  . Transportation needs:    Medical: No    Non-medical: No  Tobacco Use  . Smoking status: Former Smoker    Packs/day: 1.00    Years: 22.00    Pack years: 22.00    Types: Cigarettes    Last attempt to quit: 1983    Years since quitting: 37.1  . Smokeless tobacco: Never Used  Substance and Sexual Activity  . Alcohol use: Yes    Comment: 05/22/2018 "glass of wine couple times/wk"  . Drug use: Not Currently  . Sexual activity: Not Currently    Birth control/protection: None, Post-menopausal  Lifestyle  . Physical activity:    Days per week: Not on file    Minutes per session: Not on file  . Stress: Not on file  Relationships  . Social connections:    Talks on phone: Not on file    Gets together: Not on file    Attends religious  service: Not on file    Active member of club or organization: Not on file    Attends meetings of clubs or organizations: Not on file    Relationship status: Not on file  . Intimate partner violence:    Fear of current or ex partner: Not on file    Emotionally abused: Not on file    Physically abused: Not on file    Forced sexual activity: Not on file  Other Topics Concern  . Not on file  Social History Narrative  . Not on file    Physical Exam Cardiovascular:     Rate and Rhythm: Normal rate and regular rhythm.     Pulses: Normal pulses.  Pulmonary:     Effort: Pulmonary effort is normal.     Breath sounds: Normal breath sounds.  Musculoskeletal: Normal range of motion.     Right lower leg: Edema present.     Left lower leg: Edema present.  Skin:    General: Skin is warm and dry.     Capillary Refill: Capillary refill takes less than 2 seconds.  Neurological:     Mental Status: She is alert and oriented to person, place, and time.  Psychiatric:        Mood and Affect: Mood normal.         Future Appointments  Date Time Provider [place]  08/07/2018 10:40 AM Bensimhon, Shaune Pascal, MD [facility] None  09/02/2018  8:35 AM [facility] DEVICE REMOTES [facility]    BP (!) 82/48 (BP Location: Right Arm, Patient Position: Sitting, Cuff Size: Large)   Pulse 75   Resp 16   Wt 192 lb 6.4 oz (87.3 kg)   SpO2 95%   BMI 33.03 kg/m   Weight yesterday- 193 lb Last visit weight- 191 lb  Ms Loney was seen at home today and reported feeling generally well. She denied chest pain, SOB, headache or orthopnea. She did report an episode of dizziness this morning but it resolved upon sitting. The clinic was contacted and I was advised to hold torsemide and potassium for the next two days and then reduce to torsemide to 20 mg daily and potassium to 10 mEq daily. I made the changed to her pillbox and confirmed understanding with Ms Zani. I will follow up next week.    [name], EMT 07/18/18  ACTION: Home visit completed Next visit planned for 1 week

## 2018-07-22 DIAGNOSIS — M109 Gout, unspecified: Secondary | ICD-10-CM | POA: Diagnosis not present

## 2018-07-22 DIAGNOSIS — I5022 Chronic systolic (congestive) heart failure: Secondary | ICD-10-CM | POA: Diagnosis not present

## 2018-07-22 DIAGNOSIS — N183 Chronic kidney disease, stage 3 (moderate): Secondary | ICD-10-CM | POA: Diagnosis not present

## 2018-07-22 DIAGNOSIS — E1122 Type 2 diabetes mellitus with diabetic chronic kidney disease: Secondary | ICD-10-CM | POA: Diagnosis not present

## 2018-07-22 DIAGNOSIS — D509 Iron deficiency anemia, unspecified: Secondary | ICD-10-CM | POA: Diagnosis not present

## 2018-07-23 DIAGNOSIS — R69 Illness, unspecified: Secondary | ICD-10-CM | POA: Diagnosis not present

## 2018-07-24 DIAGNOSIS — H401133 Primary open-angle glaucoma, bilateral, severe stage: Secondary | ICD-10-CM | POA: Diagnosis not present

## 2018-07-24 DIAGNOSIS — H04123 Dry eye syndrome of bilateral lacrimal glands: Secondary | ICD-10-CM | POA: Diagnosis not present

## 2018-07-25 ENCOUNTER — Other Ambulatory Visit (HOSPITAL_COMMUNITY): Payer: Self-pay

## 2018-07-25 ENCOUNTER — Other Ambulatory Visit (HOSPITAL_COMMUNITY): Payer: Self-pay | Admitting: Internal Medicine

## 2018-07-25 MED ORDER — SPIRONOLACTONE 25 MG PO TABS: 25.0000 mg | ORAL_TABLET | ORAL | 5 refills | Status: DC

## 2018-07-25 MED ORDER — POTASSIUM CHLORIDE CRYS ER 10 MEQ PO TBCR: 20.0000 meq | EXTENDED_RELEASE_TABLET | Freq: Every day | ORAL | 5 refills | Status: DC

## 2018-07-25 MED ORDER — ATORVASTATIN CALCIUM 80 MG PO TABS: 80.0000 mg | ORAL_TABLET | Freq: Every day | ORAL | 5 refills | Status: DC

## 2018-07-25 NOTE — Progress Notes (Signed)
Paramedicine Encounter    Patient ID: Danielle [name], female    DOB: 1946/01/27, 73 y.o.   MRN: [id number]   Patient Care Team: Seward Carol, MD as PCP - General (Internal Medicine) Jorge Ny, LCSW as Social Worker (Licensed Clinical Social Worker)  Patient Active Problem List   Diagnosis Date Noted  . Encephalopathy acute 05/20/2018  . 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 Acute kidney failure ([place]) 05/15/2018  . Primary localized osteoarthritis of right knee 05/13/2018  . (HFpEF) heart failure with preserved ejection fraction ([place]) 12/20/2017  . Obesity (BMI 30-39.9) 11/11/2017  . OSA (obstructive sleep apnea) 02/18/2017  . Essential hypertension   . Diabetes mellitus without complication ([place])   . Cancer ([place])   . Thyroid disease     Current Outpatient Medications:  .  acetaminophen (TYLENOL) 500 MG tablet, Take 500-1,000 mg by mouth every 6 (six) hours as needed (for pain or headaches)., Disp: , Rfl:  .  allopurinol (ZYLOPRIM) 100 MG tablet, Take 1 tablet (100 mg total) by mouth daily., Disp: , Rfl:  .  apixaban (ELIQUIS) 5 MG TABS tablet, Take 1 tablet (5 mg total) by mouth 2 (two) times daily., Disp: 60 tablet, Rfl: 3 .  Biotin 10 MG CAPS, Take 10 mg by mouth daily., Disp: , Rfl:  .  carvedilol (COREG) 25 MG tablet, Take 1 tablet (25 mg total) by mouth 2 (two) times daily., Disp: 180 tablet, Rfl: 3 .  CHONDROITIN SULFATE PO, Take 250 mg by mouth daily., Disp: , Rfl:  .  empagliflozin (JARDIANCE) 10 MG TABS tablet, Take 10 mg by mouth daily., Disp: 30 tablet, Rfl: 11 .  ferrous sulfate 325 (65 FE) MG tablet, Take 325 mg by mouth 2 (two) times daily with a meal., Disp: , Rfl:  .  insulin glargine (LANTUS) 100 UNIT/ML injection, Inject 20 Units into the skin 2 (two) times daily after a meal. , Disp: , Rfl:  .  isosorbide mononitrate (IMDUR) 30 MG 24 hr tablet, Take 1 tablet (30 mg total) by mouth daily., Disp: 30 tablet, Rfl: 3 .   levothyroxine (SYNTHROID, LEVOTHROID) 75 MCG tablet, Take 75 mcg by mouth daily before breakfast., Disp: , Rfl:  .  losartan (COZAAR) 50 MG tablet, TAKE 1 TABLET BY MOUTH ONCE DAILY (Patient taking differently: Take 50 mg by mouth daily. ), Disp: 30 tablet, Rfl: 0 .  Multiple Vitamins-Minerals (ALIVE ONCE DAILY WOMENS 50+ PO), Take 1 tablet by mouth daily., Disp: , Rfl:  .  omeprazole (PRILOSEC) 20 MG capsule, Take 20 mg by mouth daily. , Disp: , Rfl:  .  torsemide (DEMADEX) 20 MG tablet, Take 1 tablet (20 mg total) by mouth daily., Disp: 30 tablet, Rfl: 3 .  atorvastatin (LIPITOR) 80 MG tablet, Take 1 tablet (80 mg total) by mouth at bedtime., Disp: 30 tablet, Rfl: 5 .  baclofen (LIORESAL) 10 MG tablet, Take 1 tablet (10 mg total) by mouth 3 (three) times daily. As needed for muscle spasm (Patient not taking: Reported on 06/20/2018), Disp: 50 tablet, Rfl: 0 .  calcium citrate (CALCITRATE - DOSED IN MG ELEMENTAL CALCIUM) 950 MG tablet, Take 1 tablet by mouth 2 (two) times daily. , Disp: , Rfl:  .  gabapentin (NEURONTIN) 300 MG capsule, Take 300 mg by mouth daily. , Disp: , Rfl:  .  HYDROcodone-acetaminophen (NORCO) 10-325 MG tablet, Take 1 tablet by mouth every 6 (six) hours as needed. (Patient not taking: Reported on 06/20/2018), Disp:  28 tablet, Rfl: 0 .  metFORMIN (GLUCOPHAGE) 500 MG tablet, Take 500 mg by mouth 2 (two) times daily with a meal. , Disp: , Rfl:  .  nitroGLYCERIN (NITROSTAT) 0.4 MG SL tablet, DISSOLVE ONE TABLET UNDER THE TONGUE EVERY 5 MINUTES AS NEEDED FOR CHEST PAIN. (Patient not taking: No sig reported), Disp: 25 tablet, Rfl: 3 .  ondansetron (ZOFRAN) 4 MG tablet, Take 1 tablet (4 mg total) by mouth every 8 (eight) hours as needed for nausea or vomiting. (Patient not taking: Reported on 07/04/2018), Disp: 10 tablet, Rfl: 0 .  potassium chloride (K-DUR,KLOR-CON) 10 MEQ tablet, Take 2 tablets (20 mEq total) by mouth daily., Disp: 60 tablet, Rfl: 5 .  predniSONE (DELTASONE) 20 MG tablet,  Take 3 tablets once daily for 3 days, then take 2 tablets once daily for 3 days, then take 1 tablet once daily for 3 days, then STOP (Patient not taking: Reported on 06/20/2018), Disp: 18 tablet, Rfl: 0 .  sennosides-docusate sodium (SENOKOT-S) 8.6-50 MG tablet, Take 2 tablets by mouth daily. (Patient not taking: Reported on 06/20/2018), Disp: 30 tablet, Rfl: 1 .  spironolactone (ALDACTONE) 25 MG tablet, Take 1 tablet (25 mg total) by mouth every other day., Disp: 30 tablet, Rfl: 5 .  Travoprost, BAK Free, (TRAVATAN) 0.004 % SOLN ophthalmic solution, Place 1 drop into both eyes at bedtime. , Disp: , Rfl:  Allergies  Allergen Reactions  . Other Other (See Comments)    Blood products- Patient won't accept any  . Sulfa Antibiotics Rash  . Uloric [Febuxostat] Rash      Social History   Socioeconomic History  . Marital status: Widowed    Spouse name: Not on file  . Number of children: 3  . Years of education: Not on file  . Highest education level: Some college, no degree  Occupational History  . Occupation: retired  Scientific laboratory technician  . Financial resource strain: Not hard at all  . Food insecurity:    Worry: Never true    Inability: Never true  . Transportation needs:    Medical: No    Non-medical: No  Tobacco Use  . Smoking status: Former Smoker    Packs/day: 1.00    Years: 22.00    Pack years: 22.00    Types: Cigarettes    Last attempt to quit: 1983    Years since quitting: 37.1  . Smokeless tobacco: Never Used  Substance and Sexual Activity  . Alcohol use: Yes    Comment: 05/22/2018 "glass of wine couple times/wk"  . Drug use: Not Currently  . Sexual activity: Not Currently    Birth control/protection: None, Post-menopausal  Lifestyle  . Physical activity:    Days per week: Not on file    Minutes per session: Not on file  . Stress: Not on file  Relationships  . Social connections:    Talks on phone: Not on file    Gets together: Not on file    Attends religious  service: Not on file    Active member of club or organization: Not on file    Attends meetings of clubs or organizations: Not on file    Relationship status: Not on file  . Intimate partner violence:    Fear of current or ex partner: Not on file    Emotionally abused: Not on file    Physically abused: Not on file    Forced sexual activity: Not on file  Other Topics Concern  . Not on file  Social History Narrative  . Not on file    Physical Exam Cardiovascular:     Rate and Rhythm: Normal rate and regular rhythm.     Pulses: Normal pulses.  Pulmonary:     Effort: Pulmonary effort is normal.     Breath sounds: Normal breath sounds.  Musculoskeletal: Normal range of motion.     Right lower leg: Edema present.     Left lower leg: Edema present.  Skin:    General: Skin is warm and dry.     Capillary Refill: Capillary refill takes less than 2 seconds.  Neurological:     Mental Status: She is alert and oriented to person, place, and time.  Psychiatric:        Mood and Affect: Mood normal.         Future Appointments  Date Time Provider [place]  08/07/2018 10:40 AM Bensimhon, Shaune Pascal, MD [facility] None  09/02/2018  8:35 AM [facility] DEVICE REMOTES [facility]    BP (!) 100/50 (BP Location: Right Arm, Patient Position: Sitting, Cuff Size: Large)   Pulse 74   Resp 16   Wt 193 lb (87.5 kg)   SpO2 96%   BMI 33.13 kg/m   Weight yesterday- 192.4 lb Last visit weight- 192.4 lb  Ms Jahnke was seen at home and reported feeling well. She denied chest pain, SOB, headache, dizziness or orthopnea. She stated she has been compliant with her medications over the past week and her weight has been stable. She was out of atorvastatin, spironolactone and isosorbide despite them having been ordered from the pharmacy last week. I contacted the pharmacy and they advised the medications were ready for pick up. I left written instructions on how to add these medications to  her pillbox. Her medications were verified and her pillbox was refilled. I will follow up next week.   [name], EMT 07/25/18  ACTION: Home visit completed Next visit planned for 1 week

## 2018-07-27 DIAGNOSIS — G4733 Obstructive sleep apnea (adult) (pediatric): Secondary | ICD-10-CM | POA: Diagnosis not present

## 2018-08-01 ENCOUNTER — Other Ambulatory Visit (HOSPITAL_COMMUNITY): Payer: Self-pay

## 2018-08-01 NOTE — Progress Notes (Signed)
Paramedicine Encounter    Patient ID: Danielle [name], female    DOB: [birth year], 73 y.o.   MRN: [id number]   Patient Care Team: Seward Carol, MD as PCP - General (Internal Medicine) Jorge Ny, LCSW as Social Worker (Licensed Clinical Social Worker)  Patient Active Problem List   Diagnosis Date Noted  . Encephalopathy acute 05/20/2018  . 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 Acute kidney failure ([place]) 05/15/2018  . Primary localized osteoarthritis of right knee 05/13/2018  . (HFpEF) heart failure with preserved ejection fraction ([employer]) 12/20/2017  . Obesity (BMI 30-39.9) 11/11/2017  . OSA (obstructive sleep apnea) 02/18/2017  . Essential hypertension   . Diabetes mellitus without complication ([place])   . Cancer ([place])   . Thyroid disease     Current Outpatient Medications:  .  allopurinol (ZYLOPRIM) 100 MG tablet, Take 1 tablet (100 mg total) by mouth daily., Disp: , Rfl:  .  apixaban (ELIQUIS) 5 MG TABS tablet, Take 1 tablet (5 mg total) by mouth 2 (two) times daily., Disp: 60 tablet, Rfl: 3 .  atorvastatin (LIPITOR) 80 MG tablet, Take 1 tablet (80 mg total) by mouth at bedtime., Disp: 30 tablet, Rfl: 5 .  Biotin 10 MG CAPS, Take 10 mg by mouth daily., Disp: , Rfl:  .  carvedilol (COREG) 25 MG tablet, Take 1 tablet (25 mg total) by mouth 2 (two) times daily., Disp: 180 tablet, Rfl: 3 .  CHONDROITIN SULFATE PO, Take 250 mg by mouth daily., Disp: , Rfl:  .  empagliflozin (JARDIANCE) 10 MG TABS tablet, Take 10 mg by mouth daily., Disp: 30 tablet, Rfl: 11 .  ferrous sulfate 325 (65 FE) MG tablet, Take 325 mg by mouth 2 (two) times daily with a meal., Disp: , Rfl:  .  gabapentin (NEURONTIN) 300 MG capsule, Take 300 mg by mouth daily. , Disp: , Rfl:  .  insulin glargine (LANTUS) 100 UNIT/ML injection, Inject 20 Units into the skin 2 (two) times daily after a meal. , Disp: , Rfl:  .  levothyroxine (SYNTHROID, LEVOTHROID) 75 MCG tablet, Take 75 mcg by mouth  daily before breakfast., Disp: , Rfl:  .  losartan (COZAAR) 50 MG tablet, TAKE 1 TABLET BY MOUTH ONCE DAILY (Patient taking differently: Take 50 mg by mouth daily. ), Disp: 30 tablet, Rfl: 0 .  Multiple Vitamins-Minerals (ALIVE ONCE DAILY WOMENS 50+ PO), Take 1 tablet by mouth daily., Disp: , Rfl:  .  omeprazole (PRILOSEC) 20 MG capsule, Take 20 mg by mouth daily. , Disp: , Rfl:  .  potassium chloride (K-DUR,KLOR-CON) 10 MEQ tablet, Take 2 tablets (20 mEq total) by mouth daily., Disp: 60 tablet, Rfl: 5 .  spironolactone (ALDACTONE) 25 MG tablet, Take 1 tablet (25 mg total) by mouth every other day., Disp: 30 tablet, Rfl: 5 .  torsemide (DEMADEX) 20 MG tablet, Take 1 tablet (20 mg total) by mouth daily., Disp: 30 tablet, Rfl: 3 .  Travoprost, BAK Free, (TRAVATAN) 0.004 % SOLN ophthalmic solution, Place 1 drop into both eyes at bedtime. , Disp: , Rfl:  .  acetaminophen (TYLENOL) 500 MG tablet, Take 500-1,000 mg by mouth every 6 (six) hours as needed (for pain or headaches)., Disp: , Rfl:  .  baclofen (LIORESAL) 10 MG tablet, Take 1 tablet (10 mg total) by mouth 3 (three) times daily. As needed for muscle spasm (Patient not taking: Reported on 06/20/2018), Disp: 50 tablet, Rfl: 0 .  calcium citrate (CALCITRATE - DOSED IN  MG ELEMENTAL CALCIUM) 950 MG tablet, Take 1 tablet by mouth 2 (two) times daily. , Disp: , Rfl:  .  HYDROcodone-acetaminophen (NORCO) 10-325 MG tablet, Take 1 tablet by mouth every 6 (six) hours as needed. (Patient not taking: Reported on 06/20/2018), Disp: 28 tablet, Rfl: 0 .  isosorbide mononitrate (IMDUR) 30 MG 24 hr tablet, Take 1 tablet (30 mg total) by mouth daily., Disp: 30 tablet, Rfl: 3 .  metFORMIN (GLUCOPHAGE) 500 MG tablet, Take 500 mg by mouth 2 (two) times daily with a meal. , Disp: , Rfl:  .  nitroGLYCERIN (NITROSTAT) 0.4 MG SL tablet, DISSOLVE ONE TABLET UNDER THE TONGUE EVERY 5 MINUTES AS NEEDED FOR CHEST PAIN. (Patient not taking: No sig reported), Disp: 25 tablet, Rfl:  3 .  ondansetron (ZOFRAN) 4 MG tablet, Take 1 tablet (4 mg total) by mouth every 8 (eight) hours as needed for nausea or vomiting. (Patient not taking: Reported on 07/04/2018), Disp: 10 tablet, Rfl: 0 .  predniSONE (DELTASONE) 20 MG tablet, Take 3 tablets once daily for 3 days, then take 2 tablets once daily for 3 days, then take 1 tablet once daily for 3 days, then STOP (Patient not taking: Reported on 06/20/2018), Disp: 18 tablet, Rfl: 0 .  sennosides-docusate sodium (SENOKOT-S) 8.6-50 MG tablet, Take 2 tablets by mouth daily. (Patient not taking: Reported on 06/20/2018), Disp: 30 tablet, Rfl: 1 Allergies  Allergen Reactions  . Other Other (See Comments)    Blood products- Patient won't accept any  . Sulfa Antibiotics Rash  . Uloric [Febuxostat] Rash      Social History   Socioeconomic History  . Marital status: Widowed    Spouse name: Not on file  . Number of children: 3  . Years of education: Not on file  . Highest education level: Some college, no degree  Occupational History  . Occupation: retired  Scientific laboratory technician  . Financial resource strain: Not hard at all  . Food insecurity:    Worry: Never true    Inability: Never true  . Transportation needs:    Medical: No    Non-medical: No  Tobacco Use  . Smoking status: Former Smoker    Packs/day: 1.00    Years: 22.00    Pack years: 22.00    Types: Cigarettes    Last attempt to quit: 1983    Years since quitting: 37.2  . Smokeless tobacco: Never Used  Substance and Sexual Activity  . Alcohol use: Yes    Comment: 05/22/2018 "glass of wine couple times/wk"  . Drug use: Not Currently  . Sexual activity: Not Currently    Birth control/protection: None, Post-menopausal  Lifestyle  . Physical activity:    Days per week: Not on file    Minutes per session: Not on file  . Stress: Not on file  Relationships  . Social connections:    Talks on phone: Not on file    Gets together: Not on file    Attends religious service: Not on  file    Active member of club or organization: Not on file    Attends meetings of clubs or organizations: Not on file    Relationship status: Not on file  . Intimate partner violence:    Fear of current or ex partner: Not on file    Emotionally abused: Not on file    Physically abused: Not on file    Forced sexual activity: Not on file  Other Topics Concern  . Not on file  Social History Narrative  . Not on file    Physical Exam Cardiovascular:     Rate and Rhythm: Normal rate and regular rhythm.     Pulses: Normal pulses.  Pulmonary:     Effort: Pulmonary effort is normal.     Breath sounds: Normal breath sounds.  Musculoskeletal: Normal range of motion.     Right lower leg: Edema present.     Left lower leg: Edema present.  Skin:    General: Skin is warm and dry.     Capillary Refill: Capillary refill takes less than 2 seconds.  Neurological:     Mental Status: She is alert and oriented to person, place, and time.  Psychiatric:        Mood and Affect: Mood normal.         Future Appointments  Date Time Provider [place]  08/07/2018 10:40 AM Bensimhon, Shaune Pascal, MD [facility] None  09/02/2018  8:35 AM [facility] DEVICE REMOTES [facility]    BP 102/60 (BP Location: Right Arm, Patient Position: Sitting, Cuff Size: Large)   Pulse 68   Resp 16   Wt 196 lb (88.9 kg)   SpO2 96%   BMI 33.64 kg/m   Weight yesterday-197 lb Last visit weight- 193 lb  Ms Schaffer was seen at home today and rpeorted feeling well. She denied chest pain, SOB, headache, dizziness or orthopnea. She reported being compliant with her medications over the past week however the pharmacy did not fill her isosorbide last week so she has been without it. I contacted the pharmacy and they stated she would have it ready for pick up in two hours. Her medications were ordered and her pillbox was refilled. I left written instructions with her on where to put the isosorbide when she picks  it up. She was understanding and agreeable.   [name], EMT 08/01/18  ACTION: Home visit completed Next visit planned for 1 week

## 2018-08-05 DIAGNOSIS — H401123 Primary open-angle glaucoma, left eye, severe stage: Secondary | ICD-10-CM | POA: Diagnosis not present

## 2018-08-05 DIAGNOSIS — H04123 Dry eye syndrome of bilateral lacrimal glands: Secondary | ICD-10-CM | POA: Diagnosis not present

## 2018-08-05 DIAGNOSIS — Z961 Presence of intraocular lens: Secondary | ICD-10-CM | POA: Diagnosis not present

## 2018-08-05 DIAGNOSIS — H401112 Primary open-angle glaucoma, right eye, moderate stage: Secondary | ICD-10-CM | POA: Diagnosis not present

## 2018-08-07 ENCOUNTER — Encounter (HOSPITAL_COMMUNITY): Payer: Self-pay | Admitting: Internal Medicine

## 2018-08-07 ENCOUNTER — Other Ambulatory Visit (HOSPITAL_COMMUNITY): Payer: Self-pay

## 2018-08-07 ENCOUNTER — Ambulatory Visit (HOSPITAL_COMMUNITY)
Admission: RE | Admit: 2018-08-07 | Discharge: 2018-08-07 | Disposition: A | Discharge status: Home / Self Care | Payer: Medicare HMO | Source: Ambulatory Visit | Attending: Internal Medicine | Admitting: Internal Medicine

## 2018-08-07 VITALS — BP 152/80 | HR 74 | Wt 208.6 lb

## 2018-08-07 DIAGNOSIS — Z833 Family history of diabetes mellitus: Secondary | ICD-10-CM | POA: Insufficient documentation

## 2018-08-07 DIAGNOSIS — Z923 Personal history of irradiation: Secondary | ICD-10-CM | POA: Diagnosis not present

## 2018-08-07 DIAGNOSIS — M1990 Unspecified osteoarthritis, unspecified site: Secondary | ICD-10-CM | POA: Diagnosis not present

## 2018-08-07 DIAGNOSIS — Z87891 Personal history of nicotine dependence: Secondary | ICD-10-CM | POA: Diagnosis not present

## 2018-08-07 DIAGNOSIS — I5030 Unspecified diastolic (congestive) heart failure: Secondary | ICD-10-CM | POA: Diagnosis not present

## 2018-08-07 DIAGNOSIS — G4733 Obstructive sleep apnea (adult) (pediatric): Secondary | ICD-10-CM

## 2018-08-07 DIAGNOSIS — I255 Ischemic cardiomyopathy: Secondary | ICD-10-CM | POA: Insufficient documentation

## 2018-08-07 DIAGNOSIS — M109 Gout, unspecified: Secondary | ICD-10-CM | POA: Diagnosis not present

## 2018-08-07 DIAGNOSIS — Z9221 Personal history of antineoplastic chemotherapy: Secondary | ICD-10-CM | POA: Diagnosis not present

## 2018-08-07 DIAGNOSIS — Z882 Allergy status to sulfonamides status: Secondary | ICD-10-CM | POA: Insufficient documentation

## 2018-08-07 DIAGNOSIS — I2510 Atherosclerotic heart disease of native coronary artery without angina pectoris: Secondary | ICD-10-CM | POA: Insufficient documentation

## 2018-08-07 DIAGNOSIS — E669 Obesity, unspecified: Secondary | ICD-10-CM | POA: Insufficient documentation

## 2018-08-07 DIAGNOSIS — Z79890 Hormone replacement therapy (postmenopausal): Secondary | ICD-10-CM | POA: Diagnosis not present

## 2018-08-07 DIAGNOSIS — Z955 Presence of coronary angioplasty implant and graft: Secondary | ICD-10-CM | POA: Diagnosis not present

## 2018-08-07 DIAGNOSIS — I5022 Chronic systolic (congestive) heart failure: Secondary | ICD-10-CM

## 2018-08-07 DIAGNOSIS — Z888 Allergy status to other drugs, medicaments and biological substances status: Secondary | ICD-10-CM | POA: Diagnosis not present

## 2018-08-07 DIAGNOSIS — I480 Paroxysmal atrial fibrillation: Secondary | ICD-10-CM | POA: Diagnosis not present

## 2018-08-07 DIAGNOSIS — E039 Hypothyroidism, unspecified: Secondary | ICD-10-CM | POA: Diagnosis not present

## 2018-08-07 DIAGNOSIS — Z950 Presence of cardiac pacemaker: Secondary | ICD-10-CM | POA: Diagnosis not present

## 2018-08-07 DIAGNOSIS — Z6835 Body mass index (BMI) 35.0-35.9, adult: Secondary | ICD-10-CM | POA: Insufficient documentation

## 2018-08-07 DIAGNOSIS — E119 Type 2 diabetes mellitus without complications: Secondary | ICD-10-CM | POA: Diagnosis not present

## 2018-08-07 DIAGNOSIS — I10 Essential (primary) hypertension: Secondary | ICD-10-CM | POA: Diagnosis not present

## 2018-08-07 DIAGNOSIS — E118 Type 2 diabetes mellitus with unspecified complications: Secondary | ICD-10-CM | POA: Diagnosis not present

## 2018-08-07 DIAGNOSIS — Z807 Family history of other malignant neoplasms of lymphoid, hematopoietic and related tissues: Secondary | ICD-10-CM | POA: Insufficient documentation

## 2018-08-07 DIAGNOSIS — Z7901 Long term (current) use of anticoagulants: Secondary | ICD-10-CM | POA: Insufficient documentation

## 2018-08-07 DIAGNOSIS — Z79899 Other long term (current) drug therapy: Secondary | ICD-10-CM | POA: Diagnosis not present

## 2018-08-07 DIAGNOSIS — I495 Sick sinus syndrome: Secondary | ICD-10-CM | POA: Diagnosis not present

## 2018-08-07 DIAGNOSIS — Z96651 Presence of right artificial knee joint: Secondary | ICD-10-CM | POA: Insufficient documentation

## 2018-08-07 DIAGNOSIS — I110 Hypertensive heart disease with heart failure: Secondary | ICD-10-CM | POA: Diagnosis not present

## 2018-08-07 DIAGNOSIS — Z794 Long term (current) use of insulin: Secondary | ICD-10-CM | POA: Diagnosis not present

## 2018-08-07 DIAGNOSIS — Z8249 Family history of ischemic heart disease and other diseases of the circulatory system: Secondary | ICD-10-CM | POA: Insufficient documentation

## 2018-08-07 DIAGNOSIS — Z853 Personal history of malignant neoplasm of breast: Secondary | ICD-10-CM | POA: Insufficient documentation

## 2018-08-07 LAB — BASIC METABOLIC PANEL
Anion gap: 8 (ref 5–15)
BUN: 22 mg/dL (ref 8–23)
CO2: 27 mmol/L (ref 22–32)
Calcium: 9.5 mg/dL (ref 8.9–10.3)
Chloride: 105 mmol/L (ref 98–111)
Creatinine, Ser: 1.27 mg/dL — ABNORMAL HIGH (ref 0.44–1.00)
GFR calc Af Amer: 49 mL/min — ABNORMAL LOW (ref >60)
GFR calc non Af Amer: 42 mL/min — ABNORMAL LOW (ref >60)
Glucose, Bld: 179 mg/dL — ABNORMAL HIGH (ref 70–99)
Potassium: 5.2 mmol/L — ABNORMAL HIGH (ref 3.5–5.1)
Sodium: 140 mmol/L (ref 135–145)

## 2018-08-07 LAB — BRAIN NATRIURETIC PEPTIDE: B Natriuretic Peptide: 227.8 pg/mL — ABNORMAL HIGH (ref 0.0–100.0)

## 2018-08-07 NOTE — Progress Notes (Signed)
Advanced Heart Failure Clinic Note    Primary Care: Dr. Delfina Redwood Primary Cardiologist: Dr. Haroldine Laws  EP: Dr Tomasa Blase Rheumatology: Dr Posey Rea  HPI:  Danielle [name] is a 73 y.o. female with a past medical history of systolic CHF (EF 32-35%), CAD s/p multiple stents in [place] (with h/o LAD stent thrombosis), ischemic cardiomyopathy (EF 10% at one point), symptomatic bradycardia s/p PPM, HTN, DM, hypothyroidism, and OSA sleep study 03/2017.   Admitted 02/03/17-02/06/17 with SOB, acute on chronic systolic CHF. Diuresed with IV lasix (12 pounds), discharge weight was 215 pounds.   Had R TKA 12/19.   She presents today for regular follow up. Continues to follow with paramedicine. Had an episode of hypotension post TKA but seen in ER and resolved. Continues to struggle with LE edema. But weight is stable. Has taken extra fluid pills at times but BP drops when she does that. Gets around with a walker. Weight stable at home. Struggling with arthritis. Breathing feels fine. No orthopnea or PND. Only taking torsemide daily.  ECHO 01/2017- EF 45-50% Grade 2DD Left Atrium Severely dilated.  ECHO 12/2017 EF 45-50%, G2DD, LA mildly dilated, trileaflet moderately thick aortic valve   Review of systems complete and found to be negative unless listed in HPI.    Past Medical History:  Diagnosis Date  . Acute combined systolic and diastolic congestive heart failure ([place]) 02/05/2017  . 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 Acute kidney failure ([place]) 05/15/2018  . Arthritis    "all over" (05/22/2018)  . Blood dyscrasia    per pt-has small blood cells-appears as if anemic  . Breast cancer, left breast ([place]) 1985  . CHF (congestive heart failure) ([place])   . Complication of anesthesia    difficult to awaken from per pt  . Coronary artery disease   . Dyspnea   . Essential hypertension   . Heart disease   . Heart murmur   . History of gout   . Hypothyroidism   . Obesity (BMI  30-39.9) 11/11/2017  . OSA (obstructive sleep apnea) 02/18/2017    severe obstructive sleep apnea with an AHI of 75.6/h and mild central sleep apnea with a CAI of 7.7/h.  Oxygen saturations dropped as low as 82%.   He is on CPAP at 9 cm H2O.  . OSA on CPAP   . Personal history of chemotherapy   . Personal history of radiation therapy   . Presence of permanent cardiac pacemaker   . Primary localized osteoarthritis of right knee 05/13/2018  . Refusal of blood transfusions as patient is Jehovah's Witness   . Type II diabetes mellitus ([place])     Current Outpatient Medications  Medication Sig Dispense Refill  . acetaminophen (TYLENOL) 500 MG tablet Take 500-1,000 mg by mouth every 6 (six) hours as needed (for pain or headaches).    Marland Kitchen allopurinol (ZYLOPRIM) 100 MG tablet Take 1 tablet (100 mg total) by mouth daily.    Marland Kitchen apixaban (ELIQUIS) 5 MG TABS tablet Take 1 tablet (5 mg total) by mouth 2 (two) times daily. 60 tablet 3  . atorvastatin (LIPITOR) 80 MG tablet Take 1 tablet (80 mg total) by mouth at bedtime. 30 tablet 5  . Biotin 10 MG CAPS Take 10 mg by mouth daily.    . calcium citrate (CALCITRATE - DOSED IN MG ELEMENTAL CALCIUM) 950 MG tablet Take 1 tablet by mouth 2 (two) times daily.     . carvedilol (COREG) 25 MG tablet Take  1 tablet (25 mg total) by mouth 2 (two) times daily. 180 tablet 3  . CHONDROITIN SULFATE PO Take 250 mg by mouth daily.    . empagliflozin (JARDIANCE) 10 MG TABS tablet Take 10 mg by mouth daily. 30 tablet 11  . ferrous sulfate 325 (65 FE) MG tablet Take 325 mg by mouth 2 (two) times daily with a meal.    . gabapentin (NEURONTIN) 300 MG capsule Take 300 mg by mouth daily.     . insulin glargine (LANTUS) 100 UNIT/ML injection Inject 20 Units into the skin 2 (two) times daily after a meal.     . isosorbide mononitrate (IMDUR) 30 MG 24 hr tablet Take 1 tablet (30 mg total) by mouth daily. 30 tablet 3  . levothyroxine (SYNTHROID, LEVOTHROID) 75 MCG tablet Take 75 mcg by  mouth daily before breakfast.    . losartan (COZAAR) 50 MG tablet TAKE 1 TABLET BY MOUTH ONCE DAILY (Patient taking differently: Take 50 mg by mouth daily. ) 30 tablet 0  . Multiple Vitamins-Minerals (ALIVE ONCE DAILY WOMENS 50+ PO) Take 1 tablet by mouth daily.    . nitroGLYCERIN (NITROSTAT) 0.4 MG SL tablet DISSOLVE ONE TABLET UNDER THE TONGUE EVERY 5 MINUTES AS NEEDED FOR CHEST PAIN. 25 tablet 3  . omeprazole (PRILOSEC) 20 MG capsule Take 20 mg by mouth daily.     . potassium chloride (K-DUR,KLOR-CON) 10 MEQ tablet Take 2 tablets (20 mEq total) by mouth daily. 60 tablet 5  . spironolactone (ALDACTONE) 25 MG tablet Take 1 tablet (25 mg total) by mouth every other day. 30 tablet 5  . torsemide (DEMADEX) 20 MG tablet Take 1 tablet (20 mg total) by mouth daily. 30 tablet 3  . Travoprost, BAK Free, (TRAVATAN) 0.004 % SOLN ophthalmic solution Place 1 drop into both eyes at bedtime.      No current facility-administered medications for this encounter.    Allergies  Allergen Reactions  . Other Other (See Comments)    Blood products- Patient won't accept any  . Sulfa Antibiotics Rash  . Uloric [Febuxostat] Rash   Social History   Socioeconomic History  . Marital status: Widowed    Spouse name: Not on file  . Number of children: 3  . Years of education: Not on file  . Highest education level: Some college, no degree  Occupational History  . Occupation: retired  Scientific laboratory technician  . Financial resource strain: Not hard at all  . Food insecurity:    Worry: Never true    Inability: Never true  . Transportation needs:    Medical: No    Non-medical: No  Tobacco Use  . Smoking status: Former Smoker    Packs/day: 1.00    Years: 22.00    Pack years: 22.00    Types: Cigarettes    Last attempt to quit: 1983    Years since quitting: 37.2  . Smokeless tobacco: Never Used  Substance and Sexual Activity  . Alcohol use: Yes    Comment: 05/22/2018 "glass of wine couple times/wk"  . Drug use: Not  Currently  . Sexual activity: Not Currently    Birth control/protection: None, Post-menopausal  Lifestyle  . Physical activity:    Days per week: Not on file    Minutes per session: Not on file  . Stress: Not on file  Relationships  . Social connections:    Talks on phone: Not on file    Gets together: Not on file    Attends religious service:  Not on file    Active member of club or organization: Not on file    Attends meetings of clubs or organizations: Not on file    Relationship status: Not on file  . Intimate partner violence:    Fear of current or ex partner: Not on file    Emotionally abused: Not on file    Physically abused: Not on file    Forced sexual activity: Not on file  Other Topics Concern  . Not on file  Social History Narrative  . Not on file   Family History  Problem Relation Age of Onset  . Multiple myeloma Mother   . Heart disease Father   . Other Sister   . Heart disease Brother   . Diabetes Sister   . Other Sister    Vitals:   08/07/18 1056  BP: (!) 152/80  Pulse: 74  SpO2: 97%  Weight: 94.6 kg (208 lb 9.6 oz)     Wt Readings from Last 3 Encounters:  08/07/18 94.6 kg (208 lb 9.6 oz)  08/01/18 88.9 kg (196 lb)  07/25/18 87.5 kg (193 lb)    PHYSICAL EXAM: General:  Well appearing. No resp difficulty HEENT: normal Neck: supple. no JVD. Carotids 2+ bilat; no bruits. No lymphadenopathy or thryomegaly appreciated. Cor: PMI nondisplaced. Regular rate & rhythm. No rubs, gallops or murmurs. Lungs: clear Abdomen: soft, nontender, nondistended. No hepatosplenomegaly. No bruits or masses. Good bowel sounds. Extremities: no cyanosis, clubbing, rash, 2+ edema R 1+ Left. + synovial thickening and gouty nodules on fingers.  Neuro: alert & orientedx3, cranial nerves grossly intact. moves all 4 extremities w/o difficulty. Affect pleasant   ASSESSMENT & PLAN:  1. Chronic systolic CHF: ICM, Echo 06/4578 EF 45-50%, G2DD - NYHA II-III symptoms, confounded  by arthritis. - OVerall volume status ok but she continues to struggle with LE edema R>L (TKA on R). Will prescribe compression hose and stocking donner to help. Increasing diuretics has led to AKI recently.  - Continue torsemide 64m BID. BMET today.  - Continue imdur 30 mg daily - Continue losartan 50 mg daily. - Continue 25 mg spironolactone daily.  - Continue Coreg 25 mg twice a day. - Continue jardiance 10 mg daily.  - Reinforced fluid restriction to < 2 L daily, sodium restriction to less than 2000 mg daily, and the importance of daily weights.   - Continue HF paramedicine.  2. CAD: Stenting in 2010, 2012 and 2016.  - No s/s of ischemia currently. - Continue statin. No ASA with Eliquis.  3. Paroxysmal Afib - In NSR on exam today. Continue Eliquis 5 mg BID. No bleeding - Following in Afib clinic.   4. HTN - BP elevated today but has been well controlled at home with Paramedicine following. [facility]  5. DM - Per Dr. PDelfina Redwood  - Continue Jardiance.   6. H/o sinus pauses s/p PPM - Dr. CCurt Bearsnow following. No change.   7. Gout - Per PCP.  - Has follow up with Rheumatology   8. Arthritis - Continue tylenol. Per PCP - Encourage f/u with Rheum  9. OSA-  - Reports using her cpap nightly. No change.  - Follows with Dr TArn Medal MD 08/07/18   Greater than 50% of the 25 minute visit was spent in counseling/coordination of care regarding disease state education, salt/fluid restriction, sliding scale diuretics, and medication compliance.

## 2018-08-07 NOTE — Progress Notes (Signed)
Paramedicine Encounter   Patient ID: Danielle [name] , female,   DOB: [birth year],72 y.o.,  MRN: [id number]  [name] was seen at the clinic today with Dr Haroldine Laws. No changes were made at today appointment. He suggested she follow up with rheumatology regarding her arthritis pain. She was understanding and agreeable. He also suggested she begin wearing support stockings to help with her edema. She stated she struggles with putting them on so he suggested she look into getting an assistive device from [employer] or local store. I will follow up tomorrow to refill her pillbox.   [name], EMT 08/07/2018   ACTION: Next visit planned for 1 week

## 2018-08-07 NOTE — Patient Instructions (Signed)
Labs were done today. We will call you with any ABNORMAL results. No news is good news!  PLEASE follow up with your Rheumatologist :)  Your physician wants you to follow-up in: 6 MONTHS. You will receive a reminder letter in the mail two months in advance. If you don't receive a letter, please call our office to schedule the follow-up appointment. You may also call the clinic at [phone] in approx. 2 months to schedule this appointment.

## 2018-08-08 ENCOUNTER — Other Ambulatory Visit (HOSPITAL_COMMUNITY): Payer: Self-pay

## 2018-08-08 NOTE — Progress Notes (Signed)
Paramedicine Encounter    Patient ID: Danielle [name], female    DOB: 25-Feb-1946, 73 y.o.   MRN: [id number]   Patient Care Team: Seward Carol, MD as PCP - General (Internal Medicine) Jorge Ny, LCSW as Social Worker (Licensed Clinical Social Worker)  Patient Active Problem List   Diagnosis Date Noted  . Encephalopathy acute 05/20/2018  . 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 Acute kidney failure ([place]) 05/15/2018  . Primary localized osteoarthritis of right knee 05/13/2018  . (HFpEF) heart failure with preserved ejection fraction ([place]) 12/20/2017  . Obesity (BMI 30-39.9) 11/11/2017  . OSA (obstructive sleep apnea) 02/18/2017  . Essential hypertension   . Diabetes mellitus without complication ([place])   . Cancer ([place])   . Thyroid disease     Current Outpatient Medications:  .  allopurinol (ZYLOPRIM) 100 MG tablet, Take 1 tablet (100 mg total) by mouth daily., Disp: , Rfl:  .  apixaban (ELIQUIS) 5 MG TABS tablet, Take 1 tablet (5 mg total) by mouth 2 (two) times daily., Disp: 60 tablet, Rfl: 3 .  atorvastatin (LIPITOR) 80 MG tablet, Take 1 tablet (80 mg total) by mouth at bedtime., Disp: 30 tablet, Rfl: 5 .  Biotin 10 MG CAPS, Take 10 mg by mouth daily., Disp: , Rfl:  .  calcium citrate (CALCITRATE - DOSED IN MG ELEMENTAL CALCIUM) 950 MG tablet, Take 1 tablet by mouth 2 (two) times daily. , Disp: , Rfl:  .  carvedilol (COREG) 25 MG tablet, Take 1 tablet (25 mg total) by mouth 2 (two) times daily., Disp: 180 tablet, Rfl: 3 .  CHONDROITIN SULFATE PO, Take 250 mg by mouth daily., Disp: , Rfl:  .  empagliflozin (JARDIANCE) 10 MG TABS tablet, Take 10 mg by mouth daily., Disp: 30 tablet, Rfl: 11 .  ferrous sulfate 325 (65 FE) MG tablet, Take 325 mg by mouth 2 (two) times daily with a meal., Disp: , Rfl:  .  gabapentin (NEURONTIN) 300 MG capsule, Take 300 mg by mouth daily. , Disp: , Rfl:  .  insulin glargine (LANTUS) 100 UNIT/ML injection, Inject 20 Units  into the skin 2 (two) times daily after a meal. , Disp: , Rfl:  .  isosorbide mononitrate (IMDUR) 30 MG 24 hr tablet, Take 1 tablet (30 mg total) by mouth daily., Disp: 30 tablet, Rfl: 3 .  levothyroxine (SYNTHROID, LEVOTHROID) 75 MCG tablet, Take 75 mcg by mouth daily before breakfast., Disp: , Rfl:  .  losartan (COZAAR) 50 MG tablet, TAKE 1 TABLET BY MOUTH ONCE DAILY (Patient taking differently: Take 50 mg by mouth daily. ), Disp: 30 tablet, Rfl: 0 .  Multiple Vitamins-Minerals (ALIVE ONCE DAILY WOMENS 50+ PO), Take 1 tablet by mouth daily., Disp: , Rfl:  .  nitroGLYCERIN (NITROSTAT) 0.4 MG SL tablet, DISSOLVE ONE TABLET UNDER THE TONGUE EVERY 5 MINUTES AS NEEDED FOR CHEST PAIN., Disp: 25 tablet, Rfl: 3 .  omeprazole (PRILOSEC) 20 MG capsule, Take 20 mg by mouth daily. , Disp: , Rfl:  .  potassium chloride (K-DUR,KLOR-CON) 10 MEQ tablet, Take 2 tablets (20 mEq total) by mouth daily., Disp: 60 tablet, Rfl: 5 .  spironolactone (ALDACTONE) 25 MG tablet, Take 1 tablet (25 mg total) by mouth every other day., Disp: 30 tablet, Rfl: 5 .  torsemide (DEMADEX) 20 MG tablet, Take 1 tablet (20 mg total) by mouth daily., Disp: 30 tablet, Rfl: 3 .  Travoprost, BAK Free, (TRAVATAN) 0.004 % SOLN ophthalmic solution, Place 1 drop  into both eyes at bedtime. , Disp: , Rfl:  .  acetaminophen (TYLENOL) 500 MG tablet, Take 500-1,000 mg by mouth every 6 (six) hours as needed (for pain or headaches)., Disp: , Rfl:  Allergies  Allergen Reactions  . Other Other (See Comments)    Blood products- Patient won't accept any  . Sulfa Antibiotics Rash  . Uloric [Febuxostat] Rash      Social History   Socioeconomic History  . Marital status: Widowed    Spouse name: Not on file  . Number of children: 3  . Years of education: Not on file  . Highest education level: Some college, no degree  Occupational History  . Occupation: retired  Scientific laboratory technician  . Financial resource strain: Not hard at all  . Food insecurity:     Worry: Never true    Inability: Never true  . Transportation needs:    Medical: No    Non-medical: No  Tobacco Use  . Smoking status: Former Smoker    Packs/day: 1.00    Years: 22.00    Pack years: 22.00    Types: Cigarettes    Last attempt to quit: 1983    Years since quitting: 37.2  . Smokeless tobacco: Never Used  Substance and Sexual Activity  . Alcohol use: Yes    Comment: 05/22/2018 "glass of wine couple times/wk"  . Drug use: Not Currently  . Sexual activity: Not Currently    Birth control/protection: None, Post-menopausal  Lifestyle  . Physical activity:    Days per week: Not on file    Minutes per session: Not on file  . Stress: Not on file  Relationships  . Social connections:    Talks on phone: Not on file    Gets together: Not on file    Attends religious service: Not on file    Active member of club or organization: Not on file    Attends meetings of clubs or organizations: Not on file    Relationship status: Not on file  . Intimate partner violence:    Fear of current or ex partner: Not on file    Emotionally abused: Not on file    Physically abused: Not on file    Forced sexual activity: Not on file  Other Topics Concern  . Not on file  Social History Narrative  . Not on file    Physical Exam Cardiovascular:     Rate and Rhythm: Normal rate and regular rhythm.     Pulses: Normal pulses.  Pulmonary:     Effort: Pulmonary effort is normal.     Breath sounds: Normal breath sounds.  Musculoskeletal: Normal range of motion.     Right lower leg: Edema present.     Left lower leg: Edema present.  Skin:    General: Skin is warm and dry.     Capillary Refill: Capillary refill takes less than 2 seconds.  Neurological:     Mental Status: She is alert and oriented to person, place, and time.  Psychiatric:        Mood and Affect: Mood normal.         Future Appointments  Date Time Provider [place]  09/02/2018  8:35 AM [facility] DEVICE  REMOTES [facility]    BP 124/60 (BP Location: Right Arm, Patient Position: Sitting, Cuff Size: Large)   Pulse 72   Resp 16   Wt 202 lb (91.6 kg)   SpO2 98%   BMI 34.67 kg/m   Weight  yesterday- 208 lb Last visit weight- 196 lb  Ms Zenk was seen at home today and reported feeling well. She denied chest pain, SOB, headache, dizziness or orthopnea since her appointment yesterday. She had taken her medications this morning and her blood pressure was better than yesterday. Her medications were verified and her pillbox was refilled. She needs to pick up Eliquis for next week but is good for this week. She is also out of calcium which she will also pick up this week.   [name], EMT 08/08/18  ACTION: Home visit completed Next visit planned for 1 weeks

## 2018-08-15 ENCOUNTER — Telehealth (HOSPITAL_COMMUNITY): Payer: Self-pay | Admitting: Licensed Clinical Social Worker

## 2018-08-15 ENCOUNTER — Other Ambulatory Visit (HOSPITAL_COMMUNITY): Payer: Self-pay

## 2018-08-15 NOTE — Progress Notes (Signed)
Paramedicine Encounter    Patient ID: Danielle [name], female    DOB: 18-Sep-1945, 73 y.o.   MRN: [id number]   Patient Care Team: Seward Carol, MD as PCP - General (Internal Medicine) Jorge Ny, LCSW as Social Worker (Licensed Clinical Social Worker)  Patient Active Problem List   Diagnosis Date Noted  . Encephalopathy acute 05/20/2018  . 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 Acute kidney failure ([place]) 05/15/2018  . Primary localized osteoarthritis of right knee 05/13/2018  . (HFpEF) heart failure with preserved ejection fraction ([place]) 12/20/2017  . Obesity (BMI 30-39.9) 11/11/2017  . OSA (obstructive sleep apnea) 02/18/2017  . Essential hypertension   . Diabetes mellitus without complication ([place])   . Cancer ([place])   . Thyroid disease     Current Outpatient Medications:  .  acetaminophen (TYLENOL) 500 MG tablet, Take 500-1,000 mg by mouth every 6 (six) hours as needed (for pain or headaches)., Disp: , Rfl:  .  allopurinol (ZYLOPRIM) 100 MG tablet, Take 1 tablet (100 mg total) by mouth daily., Disp: , Rfl:  .  apixaban (ELIQUIS) 5 MG TABS tablet, Take 1 tablet (5 mg total) by mouth 2 (two) times daily., Disp: 60 tablet, Rfl: 3 .  atorvastatin (LIPITOR) 80 MG tablet, Take 1 tablet (80 mg total) by mouth at bedtime., Disp: 30 tablet, Rfl: 5 .  Biotin 10 MG CAPS, Take 10 mg by mouth daily., Disp: , Rfl:  .  calcium citrate (CALCITRATE - DOSED IN MG ELEMENTAL CALCIUM) 950 MG tablet, Take 1 tablet by mouth 2 (two) times daily. , Disp: , Rfl:  .  carvedilol (COREG) 25 MG tablet, Take 1 tablet (25 mg total) by mouth 2 (two) times daily., Disp: 180 tablet, Rfl: 3 .  CHONDROITIN SULFATE PO, Take 250 mg by mouth daily., Disp: , Rfl:  .  empagliflozin (JARDIANCE) 10 MG TABS tablet, Take 10 mg by mouth daily., Disp: 30 tablet, Rfl: 11 .  ferrous sulfate 325 (65 FE) MG tablet, Take 325 mg by mouth 2 (two) times daily with a meal., Disp: , Rfl:  .  gabapentin  (NEURONTIN) 300 MG capsule, Take 300 mg by mouth daily. , Disp: , Rfl:  .  insulin glargine (LANTUS) 100 UNIT/ML injection, Inject 20 Units into the skin 2 (two) times daily after a meal. , Disp: , Rfl:  .  isosorbide mononitrate (IMDUR) 30 MG 24 hr tablet, Take 1 tablet (30 mg total) by mouth daily., Disp: 30 tablet, Rfl: 3 .  levothyroxine (SYNTHROID, LEVOTHROID) 75 MCG tablet, Take 75 mcg by mouth daily before breakfast., Disp: , Rfl:  .  losartan (COZAAR) 50 MG tablet, TAKE 1 TABLET BY MOUTH ONCE DAILY (Patient taking differently: Take 50 mg by mouth daily. ), Disp: 30 tablet, Rfl: 0 .  Multiple Vitamins-Minerals (ALIVE ONCE DAILY WOMENS 50+ PO), Take 1 tablet by mouth daily., Disp: , Rfl:  .  nitroGLYCERIN (NITROSTAT) 0.4 MG SL tablet, DISSOLVE ONE TABLET UNDER THE TONGUE EVERY 5 MINUTES AS NEEDED FOR CHEST PAIN., Disp: 25 tablet, Rfl: 3 .  omeprazole (PRILOSEC) 20 MG capsule, Take 20 mg by mouth daily. , Disp: , Rfl:  .  potassium chloride (K-DUR,KLOR-CON) 10 MEQ tablet, Take 2 tablets (20 mEq total) by mouth daily., Disp: 60 tablet, Rfl: 5 .  spironolactone (ALDACTONE) 25 MG tablet, Take 1 tablet (25 mg total) by mouth every other day., Disp: 30 tablet, Rfl: 5 .  torsemide (DEMADEX) 20 MG tablet, Take 1  tablet (20 mg total) by mouth daily., Disp: 30 tablet, Rfl: 3 .  Travoprost, BAK Free, (TRAVATAN) 0.004 % SOLN ophthalmic solution, Place 1 drop into both eyes at bedtime. , Disp: , Rfl:  Allergies  Allergen Reactions  . Other Other (See Comments)    Blood products- Patient won't accept any  . Sulfa Antibiotics Rash  . Uloric [Febuxostat] Rash      Social History   Socioeconomic History  . Marital status: Widowed    Spouse name: Not on file  . Number of children: 3  . Years of education: Not on file  . Highest education level: Some college, no degree  Occupational History  . Occupation: retired  Scientific laboratory technician  . Financial resource strain: Not hard at all  . Food insecurity:     Worry: Never true    Inability: Never true  . Transportation needs:    Medical: No    Non-medical: No  Tobacco Use  . Smoking status: Former Smoker    Packs/day: 1.00    Years: 22.00    Pack years: 22.00    Types: Cigarettes    Last attempt to quit: 1983    Years since quitting: 37.2  . Smokeless tobacco: Never Used  Substance and Sexual Activity  . Alcohol use: Yes    Comment: 05/22/2018 "glass of wine couple times/wk"  . Drug use: Not Currently  . Sexual activity: Not Currently    Birth control/protection: None, Post-menopausal  Lifestyle  . Physical activity:    Days per week: Not on file    Minutes per session: Not on file  . Stress: Not on file  Relationships  . Social connections:    Talks on phone: Not on file    Gets together: Not on file    Attends religious service: Not on file    Active member of club or organization: Not on file    Attends meetings of clubs or organizations: Not on file    Relationship status: Not on file  . Intimate partner violence:    Fear of current or ex partner: Not on file    Emotionally abused: Not on file    Physically abused: Not on file    Forced sexual activity: Not on file  Other Topics Concern  . Not on file  Social History Narrative  . Not on file    Physical Exam Cardiovascular:     Rate and Rhythm: Normal rate and regular rhythm.     Pulses: Normal pulses.  Pulmonary:     Effort: Pulmonary effort is normal.     Breath sounds: Normal breath sounds.  Musculoskeletal: Normal range of motion.     Right lower leg: Edema present.     Left lower leg: Edema present.  Skin:    General: Skin is warm and dry.     Capillary Refill: Capillary refill takes less than 2 seconds.  Neurological:     Mental Status: She is alert and oriented to person, place, and time.  Psychiatric:        Mood and Affect: Mood normal.         Future Appointments  Date Time Provider [place]  09/02/2018  8:35 AM [facility] DEVICE  REMOTES [facility]    BP 98/60 (BP Location: Left Arm, Patient Position: Sitting, Cuff Size: Normal)   Pulse 64   Resp 16   Wt 200 lb (90.7 kg)   SpO2 96%   BMI 34.33 kg/m   Weight  yesterday- 199.8 lb Last visit weight- 202 lb  Ms Zettlemoyer was seen at home today and rpeorted feeling well. She denied chest pain, SOB, headache, dizziness or orthopnea. She did complain of a dry cough secondary to seasonal allergies. She reported being compliant with her medications over the past week and her weight has been stable. Her medications were verified and her pillbox was refilled. Atorvastatin was ordered from the pharmacy.   [name], EMT 08/15/18  ACTION: Home visit completed Next visit planned for 1 week

## 2018-08-15 NOTE — Telephone Encounter (Signed)
CSW reached out to pt to check in regarding food and medication status at this time. Pt reports that community paramedic is on their way out to house now to check on medications and that she is set on food for awhile.   CSW encouraged pt to reach out with any concerns and will continue to follow and assist as needed  Jorge Ny, [place] Worker [facility] [phone]

## 2018-08-22 ENCOUNTER — Other Ambulatory Visit (HOSPITAL_COMMUNITY): Payer: Self-pay

## 2018-08-22 NOTE — Progress Notes (Signed)
Paramedicine Encounter    Patient ID: Danielle [name], female    DOB: January 03, 1946, 73 y.o.   MRN: [id number]   Patient Care Team: Seward Carol, MD as PCP - General (Internal Medicine) Jorge Ny, LCSW as Social Worker (Licensed Clinical Social Worker)  Patient Active Problem List   Diagnosis Date Noted  . Encephalopathy acute 05/20/2018  . 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 Acute kidney failure ([place]) 05/15/2018  . Primary localized osteoarthritis of right knee 05/13/2018  . (HFpEF) heart failure with preserved ejection fraction ([place]) 12/20/2017  . Obesity (BMI 30-39.9) 11/11/2017  . OSA (obstructive sleep apnea) 02/18/2017  . Essential hypertension   . Diabetes mellitus without complication ([place])   . Cancer ([place])   . Thyroid disease     Current Outpatient Medications:  .  acetaminophen (TYLENOL) 500 MG tablet, Take 500-1,000 mg by mouth every 6 (six) hours as needed (for pain or headaches)., Disp: , Rfl:  .  allopurinol (ZYLOPRIM) 100 MG tablet, Take 1 tablet (100 mg total) by mouth daily., Disp: , Rfl:  .  apixaban (ELIQUIS) 5 MG TABS tablet, Take 1 tablet (5 mg total) by mouth 2 (two) times daily., Disp: 60 tablet, Rfl: 3 .  atorvastatin (LIPITOR) 80 MG tablet, Take 1 tablet (80 mg total) by mouth at bedtime., Disp: 30 tablet, Rfl: 5 .  Biotin 10 MG CAPS, Take 10 mg by mouth daily., Disp: , Rfl:  .  calcium citrate (CALCITRATE - DOSED IN MG ELEMENTAL CALCIUM) 950 MG tablet, Take 1 tablet by mouth 2 (two) times daily. , Disp: , Rfl:  .  carvedilol (COREG) 25 MG tablet, Take 1 tablet (25 mg total) by mouth 2 (two) times daily., Disp: 180 tablet, Rfl: 3 .  CHONDROITIN SULFATE PO, Take 250 mg by mouth daily., Disp: , Rfl:  .  empagliflozin (JARDIANCE) 10 MG TABS tablet, Take 10 mg by mouth daily., Disp: 30 tablet, Rfl: 11 .  ferrous sulfate 325 (65 FE) MG tablet, Take 325 mg by mouth 2 (two) times daily with a meal., Disp: , Rfl:  .  gabapentin  (NEURONTIN) 300 MG capsule, Take 300 mg by mouth daily. , Disp: , Rfl:  .  insulin glargine (LANTUS) 100 UNIT/ML injection, Inject 20 Units into the skin 2 (two) times daily after a meal. , Disp: , Rfl:  .  isosorbide mononitrate (IMDUR) 30 MG 24 hr tablet, Take 1 tablet (30 mg total) by mouth daily., Disp: 30 tablet, Rfl: 3 .  levothyroxine (SYNTHROID, LEVOTHROID) 75 MCG tablet, Take 75 mcg by mouth daily before breakfast., Disp: , Rfl:  .  losartan (COZAAR) 50 MG tablet, TAKE 1 TABLET BY MOUTH ONCE DAILY (Patient taking differently: Take 50 mg by mouth daily. ), Disp: 30 tablet, Rfl: 0 .  Multiple Vitamins-Minerals (ALIVE ONCE DAILY WOMENS 50+ PO), Take 1 tablet by mouth daily., Disp: , Rfl:  .  nitroGLYCERIN (NITROSTAT) 0.4 MG SL tablet, DISSOLVE ONE TABLET UNDER THE TONGUE EVERY 5 MINUTES AS NEEDED FOR CHEST PAIN., Disp: 25 tablet, Rfl: 3 .  omeprazole (PRILOSEC) 20 MG capsule, Take 20 mg by mouth daily. , Disp: , Rfl:  .  potassium chloride (K-DUR,KLOR-CON) 10 MEQ tablet, Take 2 tablets (20 mEq total) by mouth daily., Disp: 60 tablet, Rfl: 5 .  spironolactone (ALDACTONE) 25 MG tablet, Take 1 tablet (25 mg total) by mouth every other day., Disp: 30 tablet, Rfl: 5 .  torsemide (DEMADEX) 20 MG tablet, Take 1  tablet (20 mg total) by mouth daily., Disp: 30 tablet, Rfl: 3 .  Travoprost, BAK Free, (TRAVATAN) 0.004 % SOLN ophthalmic solution, Place 1 drop into both eyes at bedtime. , Disp: , Rfl:  Allergies  Allergen Reactions  . Other Other (See Comments)    Blood products- Patient won't accept any  . Sulfa Antibiotics Rash  . Uloric [Febuxostat] Rash      Social History   Socioeconomic History  . Marital status: Widowed    Spouse name: Not on file  . Number of children: 3  . Years of education: Not on file  . Highest education level: Some college, no degree  Occupational History  . Occupation: retired  Scientific laboratory technician  . Financial resource strain: Not hard at all  . Food insecurity:     Worry: Never true    Inability: Never true  . Transportation needs:    Medical: No    Non-medical: No  Tobacco Use  . Smoking status: Former Smoker    Packs/day: 1.00    Years: 22.00    Pack years: 22.00    Types: Cigarettes    Last attempt to quit: 1983    Years since quitting: 37.2  . Smokeless tobacco: Never Used  Substance and Sexual Activity  . Alcohol use: Yes    Comment: 05/22/2018 "glass of wine couple times/wk"  . Drug use: Not Currently  . Sexual activity: Not Currently    Birth control/protection: None, Post-menopausal  Lifestyle  . Physical activity:    Days per week: Not on file    Minutes per session: Not on file  . Stress: Not on file  Relationships  . Social connections:    Talks on phone: Not on file    Gets together: Not on file    Attends religious service: Not on file    Active member of club or organization: Not on file    Attends meetings of clubs or organizations: Not on file    Relationship status: Not on file  . Intimate partner violence:    Fear of current or ex partner: Not on file    Emotionally abused: Not on file    Physically abused: Not on file    Forced sexual activity: Not on file  Other Topics Concern  . Not on file  Social History Narrative  . Not on file    Physical Exam Cardiovascular:     Rate and Rhythm: Normal rate and regular rhythm.     Pulses: Normal pulses.  Pulmonary:     Effort: Pulmonary effort is normal.     Breath sounds: Normal breath sounds.  Musculoskeletal: Normal range of motion.     Right lower leg: Edema present.     Left lower leg: Edema present.  Skin:    General: Skin is warm and dry.     Capillary Refill: Capillary refill takes less than 2 seconds.  Neurological:     Mental Status: She is alert and oriented to person, place, and time.  Psychiatric:        Mood and Affect: Mood normal.         Future Appointments  Date Time Provider [place]  09/02/2018  8:35 AM [facility] DEVICE  REMOTES [facility]    BP 119/62 (BP Location: Right Arm, Patient Position: Sitting, Cuff Size: Normal)   Pulse 66   Resp 18   Wt 201 lb (91.2 kg)   SpO2 98%   BMI 34.50 kg/m   Weight  yesterday- 200 lb Last visit weight- 200 lb  Ms Golomb was seen at home today and reported feeling generally well. She denied chest pain, SOB, headache, dizziness or orthopnea. She reported being compliant with her medications and her weight has been stable Her medications were verified and her pillbox was refilled. Potassium, atorvastatin, isosorbide and jardiance were ordered from the pharmacy and will be ready for pick up tomorrow.   [name], EMT 08/22/18  ACTION: Home visit completed Next visit planned for 1 week

## 2018-08-26 DIAGNOSIS — E1149 Type 2 diabetes mellitus with other diabetic neurological complication: Secondary | ICD-10-CM | POA: Diagnosis not present

## 2018-08-26 DIAGNOSIS — R0981 Nasal congestion: Secondary | ICD-10-CM | POA: Diagnosis not present

## 2018-08-27 DIAGNOSIS — G4733 Obstructive sleep apnea (adult) (pediatric): Secondary | ICD-10-CM | POA: Diagnosis not present

## 2018-08-29 ENCOUNTER — Other Ambulatory Visit (HOSPITAL_COMMUNITY): Payer: Self-pay

## 2018-08-29 NOTE — Progress Notes (Signed)
Paramedicine Encounter    Patient ID: Danielle [name], female    DOB: [birth year], 73 y.o.   MRN: [id number]   Patient Care Team: Danielle Carol, MD as PCP - General (Internal Medicine) Danielle Ny, LCSW as Social Worker (Licensed Clinical Social Worker)  Patient Active Problem List   Diagnosis Date Noted  . Encephalopathy acute 05/20/2018  . 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 Acute kidney failure ([place]) 05/15/2018  . Primary localized osteoarthritis of right knee 05/13/2018  . (HFpEF) heart failure with preserved ejection fraction ([place]) 12/20/2017  . Obesity (BMI 30-39.9) 11/11/2017  . OSA (obstructive sleep apnea) 02/18/2017  . Essential hypertension   . Diabetes mellitus without complication ([place])   . Cancer ([place])   . Thyroid disease     Current Outpatient Medications:  .  acetaminophen (TYLENOL) 500 MG tablet, Take 500-1,000 mg by mouth every 6 (six) hours as needed (for pain or headaches)., Disp: , Rfl:  .  allopurinol (ZYLOPRIM) 100 MG tablet, Take 1 tablet (100 mg total) by mouth daily., Disp: , Rfl:  .  apixaban (ELIQUIS) 5 MG TABS tablet, Take 1 tablet (5 mg total) by mouth 2 (two) times daily., Disp: 60 tablet, Rfl: 3 .  Biotin 10 MG CAPS, Take 10 mg by mouth daily., Disp: , Rfl:  .  calcium citrate (CALCITRATE - DOSED IN MG ELEMENTAL CALCIUM) 950 MG tablet, Take 1 tablet by mouth 2 (two) times daily. , Disp: , Rfl:  .  carvedilol (COREG) 25 MG tablet, Take 1 tablet (25 mg total) by mouth 2 (two) times daily., Disp: 180 tablet, Rfl: 3 .  CHONDROITIN SULFATE PO, Take 250 mg by mouth daily., Disp: , Rfl:  .  empagliflozin (JARDIANCE) 10 MG TABS tablet, Take 10 mg by mouth daily., Disp: 30 tablet, Rfl: 11 .  ferrous sulfate 325 (65 FE) MG tablet, Take 325 mg by mouth 2 (two) times daily with a meal., Disp: , Rfl:  .  gabapentin (NEURONTIN) 300 MG capsule, Take 300 mg by mouth daily. , Disp: , Rfl:  .  insulin glargine (LANTUS) 100 UNIT/ML  injection, Inject 20 Units into the skin 2 (two) times daily after a meal. , Disp: , Rfl:  .  isosorbide mononitrate (IMDUR) 30 MG 24 hr tablet, Take 1 tablet (30 mg total) by mouth daily., Disp: 30 tablet, Rfl: 3 .  levothyroxine (SYNTHROID, LEVOTHROID) 75 MCG tablet, Take 75 mcg by mouth daily before breakfast., Disp: , Rfl:  .  losartan (COZAAR) 50 MG tablet, TAKE 1 TABLET BY MOUTH ONCE DAILY (Patient taking differently: Take 50 mg by mouth daily. ), Disp: 30 tablet, Rfl: 0 .  Multiple Vitamins-Minerals (ALIVE ONCE DAILY WOMENS 50+ PO), Take 1 tablet by mouth daily., Disp: , Rfl:  .  nitroGLYCERIN (NITROSTAT) 0.4 MG SL tablet, DISSOLVE ONE TABLET UNDER THE TONGUE EVERY 5 MINUTES AS NEEDED FOR CHEST PAIN., Disp: 25 tablet, Rfl: 3 .  omeprazole (PRILOSEC) 20 MG capsule, Take 20 mg by mouth daily. , Disp: , Rfl:  .  potassium chloride (K-DUR,KLOR-CON) 10 MEQ tablet, Take 2 tablets (20 mEq total) by mouth daily., Disp: 60 tablet, Rfl: 5 .  spironolactone (ALDACTONE) 25 MG tablet, Take 1 tablet (25 mg total) by mouth every other day., Disp: 30 tablet, Rfl: 5 .  torsemide (DEMADEX) 20 MG tablet, Take 1 tablet (20 mg total) by mouth daily., Disp: 30 tablet, Rfl: 3 .  Travoprost, BAK Free, (TRAVATAN) 0.004 % SOLN ophthalmic  solution, Place 1 drop into both eyes at bedtime. , Disp: , Rfl:  .  atorvastatin (LIPITOR) 80 MG tablet, Take 1 tablet (80 mg total) by mouth at bedtime., Disp: 30 tablet, Rfl: 5 Allergies  Allergen Reactions  . Other Other (See Comments)    Blood products- Patient won't accept any  . Sulfa Antibiotics Rash  . Uloric [Febuxostat] Rash      Social History   Socioeconomic History  . Marital status: Widowed    Spouse name: Not on file  . Number of children: 3  . Years of education: Not on file  . Highest education level: Some college, no degree  Occupational History  . Occupation: retired  Scientific laboratory technician  . Financial resource strain: Not hard at all  . Food insecurity:     Worry: Never true    Inability: Never true  . Transportation needs:    Medical: No    Non-medical: No  Tobacco Use  . Smoking status: Former Smoker    Packs/day: 1.00    Years: 22.00    Pack years: 22.00    Types: Cigarettes    Last attempt to quit: 1983    Years since quitting: 37.2  . Smokeless tobacco: Never Used  Substance and Sexual Activity  . Alcohol use: Yes    Comment: 05/22/2018 "glass of wine couple times/wk"  . Drug use: Not Currently  . Sexual activity: Not Currently    Birth control/protection: None, Post-menopausal  Lifestyle  . Physical activity:    Days per week: Not on file    Minutes per session: Not on file  . Stress: Not on file  Relationships  . Social connections:    Talks on phone: Not on file    Gets together: Not on file    Attends religious service: Not on file    Active member of club or organization: Not on file    Attends meetings of clubs or organizations: Not on file    Relationship status: Not on file  . Intimate partner violence:    Fear of current or ex partner: Not on file    Emotionally abused: Not on file    Physically abused: Not on file    Forced sexual activity: Not on file  Other Topics Concern  . Not on file  Social History Narrative  . Not on file    Physical Exam Cardiovascular:     Rate and Rhythm: Normal rate and regular rhythm.     Pulses: Normal pulses.  Pulmonary:     Effort: Pulmonary effort is normal.     Breath sounds: Normal breath sounds.  Musculoskeletal: Normal range of motion.     Right lower leg: Edema present.     Left lower leg: Edema present.  Skin:    General: Skin is warm and dry.  Neurological:     Mental Status: She is alert and oriented to person, place, and time.  Psychiatric:        Mood and Affect: Mood normal.         Future Appointments  Date Time Provider [place]  09/02/2018  8:35 AM [facility] DEVICE REMOTES [facility]    BP (!) 105/51 (BP Location:  Right Arm, Patient Position: Sitting, Cuff Size: Normal)   Pulse 66   Resp 18   Wt 203 lb (92.1 kg)   SpO2 97%   BMI 34.84 kg/m   Weight yesterday- 201 lb Last visit weight- 201 lb  Danielle [name] was  seen atv home today and reported feeling well. She denied chest pain, SOB, headache, orthopnea, cough or fever. She did experience some dizziness yesterday due to sinus pressure and spoke to her PCP about it. He placed her on Flonase which has helped with the sinus pressure. Danielle Vanacker reported being compliant with her medications over the past week and her weight has been stable. Her medications were verified and her pillbox was refilled I will follow up next week.    [name], EMT 08/29/18  ACTION: Home visit completed Next visit planned for 1 week

## 2018-09-02 ENCOUNTER — Ambulatory Visit (INDEPENDENT_AMBULATORY_CARE_PROVIDER_SITE_OTHER): Payer: Medicare HMO | Admitting: *Deleted

## 2018-09-02 ENCOUNTER — Other Ambulatory Visit: Payer: Self-pay

## 2018-09-02 ENCOUNTER — Telehealth: Payer: Self-pay

## 2018-09-02 DIAGNOSIS — I495 Sick sinus syndrome: Secondary | ICD-10-CM | POA: Diagnosis not present

## 2018-09-02 NOTE — Telephone Encounter (Signed)
Spoke with patient to remind of missed remote transmission 

## 2018-09-03 LAB — CUP PACEART REMOTE DEVICE CHECK
Battery Remaining Longevity: 88 mo
Battery Voltage: 3.01 V
Brady Statistic AP VP Percent: 0.01 %
Brady Statistic AP VS Percent: 4.3 %
Brady Statistic AS VP Percent: 0.04 %
Brady Statistic AS VS Percent: 95.66 %
Brady Statistic RA Percent Paced: 4.28 %
Brady Statistic RV Percent Paced: 0.04 %
Date Time Interrogation Session: 20200408084549
Implantable Lead Implant Date: 20160222
Implantable Lead Implant Date: 20160222
Implantable Lead Location: 753859
Implantable Lead Location: 753860
Implantable Lead Model: 5076
Implantable Lead Model: 5076
Implantable Pulse Generator Implant Date: 20160222
Lead Channel Impedance Value: 361 Ohm
Lead Channel Impedance Value: 380 Ohm
Lead Channel Impedance Value: 399 Ohm
Lead Channel Impedance Value: 456 Ohm
Lead Channel Pacing Threshold Amplitude: 0.75 V
Lead Channel Pacing Threshold Amplitude: 0.75 V
Lead Channel Pacing Threshold Pulse Width: 0.4 ms
Lead Channel Pacing Threshold Pulse Width: 0.4 ms
Lead Channel Sensing Intrinsic Amplitude: 13.875 mV
Lead Channel Sensing Intrinsic Amplitude: 13.875 mV
Lead Channel Sensing Intrinsic Amplitude: 3.375 mV
Lead Channel Sensing Intrinsic Amplitude: 3.375 mV
Lead Channel Setting Pacing Amplitude: 2 V
Lead Channel Setting Pacing Amplitude: 2 V
Lead Channel Setting Pacing Pulse Width: 0.4 ms
Lead Channel Setting Sensing Sensitivity: 2 mV

## 2018-09-04 ENCOUNTER — Other Ambulatory Visit (HOSPITAL_COMMUNITY): Payer: Self-pay

## 2018-09-04 ENCOUNTER — Encounter (HOSPITAL_COMMUNITY): Payer: Self-pay

## 2018-09-04 ENCOUNTER — Telehealth: Payer: Self-pay

## 2018-09-04 MED ORDER — CARVEDILOL 25 MG PO TABS: 37.5000 mg | ORAL_TABLET | Freq: Two times a day (BID) | ORAL | 3 refills | Status: DC

## 2018-09-04 MED ORDER — APIXABAN 5 MG PO TABS: 5.0000 mg | ORAL_TABLET | Freq: Two times a day (BID) | ORAL | 3 refills | Status: DC

## 2018-09-04 NOTE — Telephone Encounter (Signed)
Pt verbalized understanding to increase her Coreg... she says she has been feeling well and will call if she has any problems.

## 2018-09-04 NOTE — Telephone Encounter (Signed)
-----   Message from Will Meredith Leeds, MD sent at 09/03/2018 10:47 AM EDT ----- Abnormal device interrogation reviewed.  Lead parameters and battery status stable.  NSVT noted. Longest 23 beats. Increase coreg to 37.5 mg BID

## 2018-09-04 NOTE — Progress Notes (Signed)
Paramedicine Encounter    Patient ID: Danielle [name], female    DOB: 1945-12-25, 73 y.o.   MRN: [id number]   Patient Care Team: Seward Carol, MD as PCP - General (Internal Medicine) Jorge Ny, LCSW as Social Worker (Licensed Clinical Social Worker)  Patient Active Problem List   Diagnosis Date Noted  . Encephalopathy acute 05/20/2018  . 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 Acute kidney failure ([place]) 05/15/2018  . Primary localized osteoarthritis of right knee 05/13/2018  . (HFpEF) heart failure with preserved ejection fraction ([place]) 12/20/2017  . Obesity (BMI 30-39.9) 11/11/2017  . OSA (obstructive sleep apnea) 02/18/2017  . Essential hypertension   . Diabetes mellitus without complication ([place])   . Cancer ([place])   . Thyroid disease     Current Outpatient Medications:  .  acetaminophen (TYLENOL) 500 MG tablet, Take 500-1,000 mg by mouth every 6 (six) hours as needed (for pain or headaches)., Disp: , Rfl:  .  allopurinol (ZYLOPRIM) 100 MG tablet, Take 1 tablet (100 mg total) by mouth daily., Disp: , Rfl:  .  apixaban (ELIQUIS) 5 MG TABS tablet, Take 1 tablet (5 mg total) by mouth 2 (two) times daily., Disp: 60 tablet, Rfl: 3 .  atorvastatin (LIPITOR) 80 MG tablet, Take 1 tablet (80 mg total) by mouth at bedtime., Disp: 30 tablet, Rfl: 5 .  Biotin 10 MG CAPS, Take 10 mg by mouth daily., Disp: , Rfl:  .  calcium citrate (CALCITRATE - DOSED IN MG ELEMENTAL CALCIUM) 950 MG tablet, Take 1 tablet by mouth 2 (two) times daily. , Disp: , Rfl:  .  carvedilol (COREG) 25 MG tablet, Take 1 tablet (25 mg total) by mouth 2 (two) times daily., Disp: 180 tablet, Rfl: 3 .  CHONDROITIN SULFATE PO, Take 250 mg by mouth daily., Disp: , Rfl:  .  empagliflozin (JARDIANCE) 10 MG TABS tablet, Take 10 mg by mouth daily., Disp: 30 tablet, Rfl: 11 .  ferrous sulfate 325 (65 FE) MG tablet, Take 325 mg by mouth 2 (two) times daily with a meal., Disp: , Rfl:  .  gabapentin  (NEURONTIN) 300 MG capsule, Take 300 mg by mouth daily. , Disp: , Rfl:  .  insulin glargine (LANTUS) 100 UNIT/ML injection, Inject 20 Units into the skin 2 (two) times daily after a meal. , Disp: , Rfl:  .  isosorbide mononitrate (IMDUR) 30 MG 24 hr tablet, Take 1 tablet (30 mg total) by mouth daily., Disp: 30 tablet, Rfl: 3 .  levothyroxine (SYNTHROID, LEVOTHROID) 75 MCG tablet, Take 75 mcg by mouth daily before breakfast., Disp: , Rfl:  .  losartan (COZAAR) 50 MG tablet, TAKE 1 TABLET BY MOUTH ONCE DAILY (Patient taking differently: Take 50 mg by mouth daily. ), Disp: 30 tablet, Rfl: 0 .  Multiple Vitamins-Minerals (ALIVE ONCE DAILY WOMENS 50+ PO), Take 1 tablet by mouth daily., Disp: , Rfl:  .  nitroGLYCERIN (NITROSTAT) 0.4 MG SL tablet, DISSOLVE ONE TABLET UNDER THE TONGUE EVERY 5 MINUTES AS NEEDED FOR CHEST PAIN., Disp: 25 tablet, Rfl: 3 .  omeprazole (PRILOSEC) 20 MG capsule, Take 20 mg by mouth daily. , Disp: , Rfl:  .  potassium chloride (K-DUR,KLOR-CON) 10 MEQ tablet, Take 2 tablets (20 mEq total) by mouth daily., Disp: 60 tablet, Rfl: 5 .  spironolactone (ALDACTONE) 25 MG tablet, Take 1 tablet (25 mg total) by mouth every other day., Disp: 30 tablet, Rfl: 5 .  torsemide (DEMADEX) 20 MG tablet, Take 1  tablet (20 mg total) by mouth daily., Disp: 30 tablet, Rfl: 3 .  Travoprost, BAK Free, (TRAVATAN) 0.004 % SOLN ophthalmic solution, Place 1 drop into both eyes at bedtime. , Disp: , Rfl:  Allergies  Allergen Reactions  . Other Other (See Comments)    Blood products- Patient won't accept any  . Sulfa Antibiotics Rash  . Uloric [Febuxostat] Rash      Social History   Socioeconomic History  . Marital status: Widowed    Spouse name: Not on file  . Number of children: 3  . Years of education: Not on file  . Highest education level: Some college, no degree  Occupational History  . Occupation: retired  Scientific laboratory technician  . Financial resource strain: Not hard at all  . Food insecurity:     Worry: Never true    Inability: Never true  . Transportation needs:    Medical: No    Non-medical: No  Tobacco Use  . Smoking status: Former Smoker    Packs/day: 1.00    Years: 22.00    Pack years: 22.00    Types: Cigarettes    Last attempt to quit: 1983    Years since quitting: 37.2  . Smokeless tobacco: Never Used  Substance and Sexual Activity  . Alcohol use: Yes    Comment: 05/22/2018 "glass of wine couple times/wk"  . Drug use: Not Currently  . Sexual activity: Not Currently    Birth control/protection: None, Post-menopausal  Lifestyle  . Physical activity:    Days per week: Not on file    Minutes per session: Not on file  . Stress: Not on file  Relationships  . Social connections:    Talks on phone: Not on file    Gets together: Not on file    Attends religious service: Not on file    Active member of club or organization: Not on file    Attends meetings of clubs or organizations: Not on file    Relationship status: Not on file  . Intimate partner violence:    Fear of current or ex partner: Not on file    Emotionally abused: Not on file    Physically abused: Not on file    Forced sexual activity: Not on file  Other Topics Concern  . Not on file  Social History Narrative  . Not on file    Physical Exam Cardiovascular:     Rate and Rhythm: Normal rate and regular rhythm.     Pulses: Normal pulses.  Pulmonary:     Effort: Pulmonary effort is normal.     Breath sounds: Normal breath sounds.  Musculoskeletal: Normal range of motion.     Right lower leg: Edema present.     Left lower leg: Edema present.  Skin:    General: Skin is warm and dry.     Capillary Refill: Capillary refill takes less than 2 seconds.  Neurological:     Mental Status: She is oriented to person, place, and time.  Psychiatric:        Behavior: Behavior normal.         No future appointments.  BP 138/63 (BP Location: Right Arm, Patient Position: Sitting, Cuff Size: Normal)    Pulse 67   Resp 18   Wt 206 lb (93.4 kg)   SpO2 100%   BMI 35.36 kg/m   Weight yesterday- 205 lb Last visit weight- 203 lb  [name] was seen at home today and reported feeling well. She  denied chest pain, SOB, headache, dizziness, orthopnea, cough or fever. She stated she has been compliant with her medications over the past week, however she missed all of Saturday's medications. Her weight has been trending up but is not more than 5 lb over last week. Her medications were verified and her pillbox was refilled. I will follow up next week.   [name], EMT 09/04/18  ACTION: Home visit completed Next visit planned for 1 week

## 2018-09-08 ENCOUNTER — Telehealth (HOSPITAL_COMMUNITY): Payer: Self-pay | Admitting: Licensed Clinical Social Worker

## 2018-09-08 NOTE — Telephone Encounter (Signed)
CSW reached out to pt to check in regarding food and medication status at this time.  Pt reports no concerns at this time- has everything she needs.  CSW encouraged pt to reach out with any concerns and will continue to follow and assist as needed  Jorge Ny, [place] Worker [facility] [phone]

## 2018-09-10 ENCOUNTER — Telehealth (HOSPITAL_COMMUNITY): Payer: Self-pay

## 2018-09-10 ENCOUNTER — Encounter: Payer: Self-pay | Admitting: Cardiology

## 2018-09-10 ENCOUNTER — Other Ambulatory Visit (HOSPITAL_COMMUNITY): Payer: Self-pay

## 2018-09-10 NOTE — Progress Notes (Signed)
Remote pacemaker transmission.   

## 2018-09-10 NOTE — Telephone Encounter (Signed)
I called Danielle [name] to schedule an appointment. She stated tomorrow morning was good for her so we agreed to meet at 10:00.

## 2018-09-10 NOTE — Progress Notes (Signed)
error 

## 2018-09-11 ENCOUNTER — Other Ambulatory Visit (HOSPITAL_COMMUNITY): Payer: Self-pay

## 2018-09-11 ENCOUNTER — Other Ambulatory Visit (HOSPITAL_COMMUNITY): Payer: Self-pay | Admitting: Cardiology

## 2018-09-11 MED ORDER — APIXABAN 5 MG PO TABS: 5.0000 mg | ORAL_TABLET | Freq: Two times a day (BID) | ORAL | 3 refills | Status: DC

## 2018-09-11 NOTE — Progress Notes (Signed)
Paramedicine Encounter    Patient ID: Danielle [name], female    DOB: [birth year], 73 y.o.   MRN: [id number]   Patient Care Team: Seward Carol, MD as PCP - General (Internal Medicine) Jorge Ny, LCSW as Social Worker (Licensed Clinical Social Worker)  Patient Active Problem List   Diagnosis Date Noted  . Encephalopathy acute 05/20/2018  . 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 Acute kidney failure ([place]) 05/15/2018  . Primary localized osteoarthritis of right knee 05/13/2018  . (HFpEF) heart failure with preserved ejection fraction ([place]) 12/20/2017  . Obesity (BMI 30-39.9) 11/11/2017  . OSA (obstructive sleep apnea) 02/18/2017  . Essential hypertension   . Diabetes mellitus without complication ([place])   . Cancer ([name])   . Thyroid disease     Current Outpatient Medications:  .  acetaminophen (TYLENOL) 500 MG tablet, Take 500-1,000 mg by mouth every 6 (six) hours as needed (for pain or headaches)., Disp: , Rfl:  .  allopurinol (ZYLOPRIM) 100 MG tablet, Take 1 tablet (100 mg total) by mouth daily., Disp: , Rfl:  .  atorvastatin (LIPITOR) 80 MG tablet, Take 1 tablet (80 mg total) by mouth at bedtime., Disp: 30 tablet, Rfl: 5 .  Biotin 10 MG CAPS, Take 10 mg by mouth daily., Disp: , Rfl:  .  calcium citrate (CALCITRATE - DOSED IN MG ELEMENTAL CALCIUM) 950 MG tablet, Take 1 tablet by mouth 2 (two) times daily. , Disp: , Rfl:  .  carvedilol (COREG) 25 MG tablet, Take 1.5 tablets (37.5 mg total) by mouth 2 (two) times daily with a meal., Disp: 180 tablet, Rfl: 3 .  CHONDROITIN SULFATE PO, Take 250 mg by mouth daily., Disp: , Rfl:  .  empagliflozin (JARDIANCE) 10 MG TABS tablet, Take 10 mg by mouth daily., Disp: 30 tablet, Rfl: 11 .  ferrous sulfate 325 (65 FE) MG tablet, Take 325 mg by mouth 2 (two) times daily with a meal., Disp: , Rfl:  .  gabapentin (NEURONTIN) 300 MG capsule, Take 300 mg by mouth daily. , Disp: , Rfl:  .  insulin glargine (LANTUS) 100  UNIT/ML injection, Inject 20 Units into the skin 2 (two) times daily after a meal. , Disp: , Rfl:  .  isosorbide mononitrate (IMDUR) 30 MG 24 hr tablet, Take 1 tablet (30 mg total) by mouth daily., Disp: 30 tablet, Rfl: 3 .  levothyroxine (SYNTHROID, LEVOTHROID) 75 MCG tablet, Take 75 mcg by mouth daily before breakfast., Disp: , Rfl:  .  losartan (COZAAR) 50 MG tablet, TAKE 1 TABLET BY MOUTH ONCE DAILY (Patient taking differently: Take 50 mg by mouth daily. ), Disp: 30 tablet, Rfl: 0 .  Multiple Vitamins-Minerals (ALIVE ONCE DAILY WOMENS 50+ PO), Take 1 tablet by mouth daily., Disp: , Rfl:  .  nitroGLYCERIN (NITROSTAT) 0.4 MG SL tablet, DISSOLVE ONE TABLET UNDER THE TONGUE EVERY 5 MINUTES AS NEEDED FOR CHEST PAIN., Disp: 25 tablet, Rfl: 3 .  omeprazole (PRILOSEC) 20 MG capsule, Take 20 mg by mouth daily. , Disp: , Rfl:  .  potassium chloride (K-DUR,KLOR-CON) 10 MEQ tablet, Take 2 tablets (20 mEq total) by mouth daily., Disp: 60 tablet, Rfl: 5 .  spironolactone (ALDACTONE) 25 MG tablet, Take 1 tablet (25 mg total) by mouth every other day., Disp: 30 tablet, Rfl: 5 .  torsemide (DEMADEX) 20 MG tablet, Take 1 tablet (20 mg total) by mouth daily., Disp: 30 tablet, Rfl: 3 .  Travoprost, BAK Free, (TRAVATAN) 0.004 % SOLN ophthalmic  solution, Place 1 drop into both eyes at bedtime. , Disp: , Rfl:  .  apixaban (ELIQUIS) 5 MG TABS tablet, Take 1 tablet (5 mg total) by mouth 2 (two) times daily., Disp: 180 tablet, Rfl: 3 Allergies  Allergen Reactions  . Other Other (See Comments)    Blood products- Patient won't accept any  . Sulfa Antibiotics Rash  . Uloric [Febuxostat] Rash      Social History   Socioeconomic History  . Marital status: Widowed    Spouse name: Not on file  . Number of children: 3  . Years of education: Not on file  . Highest education level: Some college, no degree  Occupational History  . Occupation: retired  Scientific laboratory technician  . Financial resource strain: Not hard at all  .  Food insecurity:    Worry: Never true    Inability: Never true  . Transportation needs:    Medical: No    Non-medical: No  Tobacco Use  . Smoking status: Former Smoker    Packs/day: 1.00    Years: 22.00    Pack years: 22.00    Types: Cigarettes    Last attempt to quit: 1983    Years since quitting: 37.3  . Smokeless tobacco: Never Used  Substance and Sexual Activity  . Alcohol use: Yes    Comment: 05/22/2018 "glass of wine couple times/wk"  . Drug use: Not Currently  . Sexual activity: Not Currently    Birth control/protection: None, Post-menopausal  Lifestyle  . Physical activity:    Days per week: Not on file    Minutes per session: Not on file  . Stress: Not on file  Relationships  . Social connections:    Talks on phone: Not on file    Gets together: Not on file    Attends religious service: Not on file    Active member of club or organization: Not on file    Attends meetings of clubs or organizations: Not on file    Relationship status: Not on file  . Intimate partner violence:    Fear of current or ex partner: Not on file    Emotionally abused: Not on file    Physically abused: Not on file    Forced sexual activity: Not on file  Other Topics Concern  . Not on file  Social History Narrative  . Not on file    Physical Exam Cardiovascular:     Rate and Rhythm: Normal rate and regular rhythm.     Pulses: Normal pulses.  Pulmonary:     Effort: Pulmonary effort is normal.     Breath sounds: Normal breath sounds.  Abdominal:     General: Abdomen is flat.  Musculoskeletal: Normal range of motion.     Right lower leg: Edema present.     Left lower leg: Edema present.  Skin:    General: Skin is warm and dry.     Capillary Refill: Capillary refill takes less than 2 seconds.  Neurological:     Mental Status: She is alert and oriented to person, place, and time.  Psychiatric:        Mood and Affect: Mood normal.         Future Appointments  Date Time  Provider [place]  12/02/2018  7:35 AM [facility]OT[facility]    BP 125/64 (BP Location: Right Arm, Patient Position: Sitting, Cuff Size: Normal)   Pulse 70   Resp 16   Wt 205 lb (93  kg)   SpO2 98%   BMI 35.19 kg/m   Weight yesterday- 20lb Last visit weight- 206 lb  Danielle [name] was seen at home today and reported feeling well. She denied chest pain, SOB, headache, dizziness, orthopnea, cough or fever since our last visit. She stated she has been compliant with her medications and her weight has been stable. Her medications were verified and her pillbox was refilled.   [name], EMT 09/11/18  ACTION: Home visit completed Next visit planned for 1 week

## 2018-09-18 ENCOUNTER — Telehealth (HOSPITAL_COMMUNITY): Payer: Self-pay | Admitting: Adult Health

## 2018-09-18 ENCOUNTER — Other Ambulatory Visit (HOSPITAL_COMMUNITY): Payer: Self-pay | Admitting: Internal Medicine

## 2018-09-18 ENCOUNTER — Telehealth (HOSPITAL_COMMUNITY): Payer: Self-pay

## 2018-09-18 ENCOUNTER — Other Ambulatory Visit (HOSPITAL_COMMUNITY): Payer: Self-pay

## 2018-09-18 MED ORDER — LOSARTAN POTASSIUM 50 MG PO TABS: 50.0000 mg | ORAL_TABLET | Freq: Every day | ORAL | 6 refills | Status: DC

## 2018-09-18 NOTE — Telephone Encounter (Signed)
error 

## 2018-09-18 NOTE — Telephone Encounter (Signed)
I called Danielle [name] to schedule a meeting. She stated she would be available this afternoon so we agreed to meet at 13:00.

## 2018-09-18 NOTE — Telephone Encounter (Signed)
   On 4/9 carvedilol was increased to 37.5 twice a day for NSVT per EP.   She was able to start higher dose on 09/11/18   Today she called with HF Paramedic Yevonne Aline to report dizziness.   BP sitting 93/69  BP standing 87/60   I reivewed her medications. We will switch losartan to 50 mg at bedtime starting tomorrow to see if this helps.   She agreed with the change and Thedore Mins will continue to fix her pill box.    [name] Clegg NP-C  1:56 PM

## 2018-09-18 NOTE — Progress Notes (Signed)
Paramedicine Encounter    Patient ID: Danielle [name], female    DOB: [birth year], 73 y.o.   MRN: [id number]   Patient Care Team: Seward Carol, MD as PCP - General (Internal Medicine) Jorge Ny, LCSW as Social Worker (Licensed Clinical Social Worker)  Patient Active Problem List   Diagnosis Date Noted  . Encephalopathy acute 05/20/2018  . 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 Acute kidney failure ([place]) 05/15/2018  . Primary localized osteoarthritis of right knee 05/13/2018  . (HFpEF) heart failure with preserved ejection fraction ([place]) 12/20/2017  . Obesity (BMI 30-39.9) 11/11/2017  . OSA (obstructive sleep apnea) 02/18/2017  . Essential hypertension   . Diabetes mellitus without complication ([place])   . Cancer ([place])   . Thyroid disease     Current Outpatient Medications:  .  acetaminophen (TYLENOL) 500 MG tablet, Take 500-1,000 mg by mouth every 6 (six) hours as needed (for pain or headaches)., Disp: , Rfl:  .  allopurinol (ZYLOPRIM) 100 MG tablet, Take 1 tablet (100 mg total) by mouth daily., Disp: , Rfl:  .  apixaban (ELIQUIS) 5 MG TABS tablet, Take 1 tablet (5 mg total) by mouth 2 (two) times daily., Disp: 180 tablet, Rfl: 3 .  atorvastatin (LIPITOR) 80 MG tablet, Take 1 tablet (80 mg total) by mouth at bedtime., Disp: 30 tablet, Rfl: 5 .  Biotin 10 MG CAPS, Take 10 mg by mouth daily., Disp: , Rfl:  .  calcium citrate (CALCITRATE - DOSED IN MG ELEMENTAL CALCIUM) 950 MG tablet, Take 1 tablet by mouth 2 (two) times daily. , Disp: , Rfl:  .  carvedilol (COREG) 25 MG tablet, Take 1.5 tablets (37.5 mg total) by mouth 2 (two) times daily with a meal., Disp: 180 tablet, Rfl: 3 .  CHONDROITIN SULFATE PO, Take 250 mg by mouth daily., Disp: , Rfl:  .  empagliflozin (JARDIANCE) 10 MG TABS tablet, Take 10 mg by mouth daily., Disp: 30 tablet, Rfl: 11 .  ferrous sulfate 325 (65 FE) MG tablet, Take 325 mg by mouth 2 (two) times daily with a meal., Disp: , Rfl:   .  gabapentin (NEURONTIN) 300 MG capsule, Take 300 mg by mouth daily. , Disp: , Rfl:  .  insulin glargine (LANTUS) 100 UNIT/ML injection, Inject 20 Units into the skin 2 (two) times daily after a meal. , Disp: , Rfl:  .  isosorbide mononitrate (IMDUR) 30 MG 24 hr tablet, Take 1 tablet (30 mg total) by mouth daily., Disp: 30 tablet, Rfl: 3 .  levothyroxine (SYNTHROID, LEVOTHROID) 75 MCG tablet, Take 75 mcg by mouth daily before breakfast., Disp: , Rfl:  .  losartan (COZAAR) 50 MG tablet, TAKE 1 TABLET BY MOUTH ONCE DAILY (Patient taking differently: Take 50 mg by mouth daily. ), Disp: 30 tablet, Rfl: 0 .  Multiple Vitamins-Minerals (ALIVE ONCE DAILY WOMENS 50+ PO), Take 1 tablet by mouth daily., Disp: , Rfl:  .  nitroGLYCERIN (NITROSTAT) 0.4 MG SL tablet, DISSOLVE ONE TABLET UNDER THE TONGUE EVERY 5 MINUTES AS NEEDED FOR CHEST PAIN., Disp: 25 tablet, Rfl: 3 .  omeprazole (PRILOSEC) 20 MG capsule, Take 20 mg by mouth daily. , Disp: , Rfl:  .  potassium chloride (K-DUR,KLOR-CON) 10 MEQ tablet, Take 2 tablets (20 mEq total) by mouth daily., Disp: 60 tablet, Rfl: 5 .  spironolactone (ALDACTONE) 25 MG tablet, Take 1 tablet (25 mg total) by mouth every other day., Disp: 30 tablet, Rfl: 5 .  torsemide (DEMADEX) 20 MG  tablet, Take 1 tablet (20 mg total) by mouth daily., Disp: 30 tablet, Rfl: 3 .  Travoprost, BAK Free, (TRAVATAN) 0.004 % SOLN ophthalmic solution, Place 1 drop into both eyes at bedtime. , Disp: , Rfl:  Allergies  Allergen Reactions  . Other Other (See Comments)    Blood products- Patient won't accept any  . Sulfa Antibiotics Rash  . Uloric [Febuxostat] Rash      Social History   Socioeconomic History  . Marital status: Widowed    Spouse name: Not on file  . Number of children: 3  . Years of education: Not on file  . Highest education level: Some college, no degree  Occupational History  . Occupation: retired  Scientific laboratory technician  . Financial resource strain: Not hard at all  . Food  insecurity:    Worry: Never true    Inability: Never true  . Transportation needs:    Medical: No    Non-medical: No  Tobacco Use  . Smoking status: Former Smoker    Packs/day: 1.00    Years: 22.00    Pack years: 22.00    Types: Cigarettes    Last attempt to quit: 1983    Years since quitting: 37.3  . Smokeless tobacco: Never Used  Substance and Sexual Activity  . Alcohol use: Yes    Comment: 05/22/2018 "glass of wine couple times/wk"  . Drug use: Not Currently  . Sexual activity: Not Currently    Birth control/protection: None, Post-menopausal  Lifestyle  . Physical activity:    Days per week: Not on file    Minutes per session: Not on file  . Stress: Not on file  Relationships  . Social connections:    Talks on phone: Not on file    Gets together: Not on file    Attends religious service: Not on file    Active member of club or organization: Not on file    Attends meetings of clubs or organizations: Not on file    Relationship status: Not on file  . Intimate partner violence:    Fear of current or ex partner: Not on file    Emotionally abused: Not on file    Physically abused: Not on file    Forced sexual activity: Not on file  Other Topics Concern  . Not on file  Social History Narrative  . Not on file    Physical Exam Cardiovascular:     Rate and Rhythm: Normal rate and regular rhythm.     Pulses: Normal pulses.  Pulmonary:     Effort: Pulmonary effort is normal.     Breath sounds: Normal breath sounds.  Musculoskeletal: Normal range of motion.     Right lower leg: Edema present.     Left lower leg: Edema present.  Skin:    General: Skin is warm and dry.     Capillary Refill: Capillary refill takes less than 2 seconds.  Neurological:     Mental Status: She is alert and oriented to person, place, and time.  Psychiatric:        Mood and Affect: Mood normal.         Future Appointments  Date Time Provider [place]  12/02/2018  7:35 AM  [facility]    BP (!) 93/48 (BP Location: Right Arm, Patient Position: Sitting, Cuff Size: Normal)   Pulse 70   Resp 18   Wt 205 lb (93 kg)   SpO2 99%   BMI 35.19  kg/m   Weight yesterday- 205 lb  Last visit weight- 205 lb  Ms Brossman was seen at home today and reported feeling lightheaded. She denied chest pain, SOB, headache, orthopnea, cough or fever since our last visit and stated the lightheadedness began only today. She was found to be hypotensive so I contacted Priscella Mann, NP, who advised to move losartan to the evening to avoid her becoming dizzy during the day. The appropriate changes were made to her pillbox. Her medications were verified and her pillbox was refilled. She has not picked up her medications from the pharmacy but stated she would get them by tomorrow. I left written instructions on where to put her medication once it is picked up. I also requested she call me when she has it so I can be sure she doesn't miss any doses. She was understanding and agreeable.  [name], EMT 09/18/18  ACTION: Home visit completed Next visit planned for 1 week

## 2018-09-23 DIAGNOSIS — R0981 Nasal congestion: Secondary | ICD-10-CM | POA: Diagnosis not present

## 2018-09-25 ENCOUNTER — Telehealth (HOSPITAL_COMMUNITY): Payer: Self-pay

## 2018-09-25 ENCOUNTER — Telehealth (HOSPITAL_COMMUNITY): Payer: Self-pay | Admitting: Adult Health

## 2018-09-25 ENCOUNTER — Other Ambulatory Visit (HOSPITAL_COMMUNITY): Payer: Self-pay

## 2018-09-25 MED ORDER — CARVEDILOL 25 MG PO TABS: 25.0000 mg | ORAL_TABLET | Freq: Two times a day (BID) | ORAL | 3 refills | Status: DC

## 2018-09-25 NOTE — Telephone Encounter (Signed)
   Yevonne Aline HF Paramedic called.   Called about hypotension with SBP 80-90 and dizziness. Says she has felt bad since she increased carvedilol to 37.5 mg twice day on 09/04/18 . Carvedilol was increased for NSVT on remote transmission.   Will cut back carvedilol to 25 mg twice a day.   Thedore Mins will make med change today.   I will forward to Chanetta Marshall NP, EP to see if she needs any other changes.   [name] Clegg NP-C  3:26 PM

## 2018-09-25 NOTE — Progress Notes (Signed)
Paramedicine Encounter    Patient ID: Danielle [name], female    DOB: 1946-02-07, 73 y.o.   MRN: [id number]   Patient Care Team: Seward Carol, MD as PCP - General (Internal Medicine) Jorge Ny, LCSW as Social Worker (Licensed Clinical Social Worker)  Patient Active Problem List   Diagnosis Date Noted  . Encephalopathy acute 05/20/2018  . 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 Acute kidney failure ([place]) 05/15/2018  . Primary localized osteoarthritis of right knee 05/13/2018  . (HFpEF) heart failure with preserved ejection fraction ([place]) 12/20/2017  . Obesity (BMI 30-39.9) 11/11/2017  . OSA (obstructive sleep apnea) 02/18/2017  . Essential hypertension   . Diabetes mellitus without complication ([place])   . Cancer ([place])   . Thyroid disease     Current Outpatient Medications:  .  acetaminophen (TYLENOL) 500 MG tablet, Take 500-1,000 mg by mouth every 6 (six) hours as needed (for pain or headaches)., Disp: , Rfl:  .  allopurinol (ZYLOPRIM) 100 MG tablet, Take 1 tablet (100 mg total) by mouth daily., Disp: , Rfl:  .  apixaban (ELIQUIS) 5 MG TABS tablet, Take 1 tablet (5 mg total) by mouth 2 (two) times daily., Disp: 180 tablet, Rfl: 3 .  atorvastatin (LIPITOR) 80 MG tablet, Take 1 tablet (80 mg total) by mouth at bedtime., Disp: 30 tablet, Rfl: 5 .  Biotin 10 MG CAPS, Take 10 mg by mouth daily., Disp: , Rfl:  .  calcium citrate (CALCITRATE - DOSED IN MG ELEMENTAL CALCIUM) 950 MG tablet, Take 1 tablet by mouth 2 (two) times daily. , Disp: , Rfl:  .  carvedilol (COREG) 25 MG tablet, Take 1.5 tablets (37.5 mg total) by mouth 2 (two) times daily with a meal., Disp: 180 tablet, Rfl: 3 .  empagliflozin (JARDIANCE) 10 MG TABS tablet, Take 10 mg by mouth daily., Disp: 30 tablet, Rfl: 11 .  ferrous sulfate 325 (65 FE) MG tablet, Take 325 mg by mouth 2 (two) times daily with a meal., Disp: , Rfl:  .  gabapentin (NEURONTIN) 300 MG capsule, Take 300 mg by mouth daily.  , Disp: , Rfl:  .  insulin glargine (LANTUS) 100 UNIT/ML injection, Inject 20 Units into the skin 2 (two) times daily after a meal. , Disp: , Rfl:  .  isosorbide mononitrate (IMDUR) 30 MG 24 hr tablet, Take 1 tablet (30 mg total) by mouth daily., Disp: 30 tablet, Rfl: 3 .  levothyroxine (SYNTHROID, LEVOTHROID) 75 MCG tablet, Take 75 mcg by mouth daily before breakfast., Disp: , Rfl:  .  losartan (COZAAR) 50 MG tablet, Take 1 tablet (50 mg total) by mouth at bedtime., Disp: 30 tablet, Rfl: 6 .  Multiple Vitamins-Minerals (ALIVE ONCE DAILY WOMENS 50+ PO), Take 1 tablet by mouth daily., Disp: , Rfl:  .  nitroGLYCERIN (NITROSTAT) 0.4 MG SL tablet, DISSOLVE ONE TABLET UNDER THE TONGUE EVERY 5 MINUTES AS NEEDED FOR CHEST PAIN., Disp: 25 tablet, Rfl: 3 .  omeprazole (PRILOSEC) 20 MG capsule, Take 20 mg by mouth daily. , Disp: , Rfl:  .  potassium chloride (K-DUR,KLOR-CON) 10 MEQ tablet, Take 2 tablets (20 mEq total) by mouth daily., Disp: 60 tablet, Rfl: 5 .  spironolactone (ALDACTONE) 25 MG tablet, Take 1 tablet (25 mg total) by mouth every other day., Disp: 30 tablet, Rfl: 5 .  torsemide (DEMADEX) 20 MG tablet, Take 1 tablet (20 mg total) by mouth daily., Disp: 30 tablet, Rfl: 3 .  Travoprost, BAK Free, (TRAVATAN) 0.004 %  SOLN ophthalmic solution, Place 1 drop into both eyes at bedtime. , Disp: , Rfl:  .  CHONDROITIN SULFATE PO, Take 250 mg by mouth daily., Disp: , Rfl:  Allergies  Allergen Reactions  . Other Other (See Comments)    Blood products- Patient won't accept any  . Sulfa Antibiotics Rash  . Uloric [Febuxostat] Rash      Social History   Socioeconomic History  . Marital status: Widowed    Spouse name: Not on file  . Number of children: 3  . Years of education: Not on file  . Highest education level: Some college, no degree  Occupational History  . Occupation: retired  Scientific laboratory technician  . Financial resource strain: Not hard at all  . Food insecurity:    Worry: Never true     Inability: Never true  . Transportation needs:    Medical: No    Non-medical: No  Tobacco Use  . Smoking status: Former Smoker    Packs/day: 1.00    Years: 22.00    Pack years: 22.00    Types: Cigarettes    Last attempt to quit: 1983    Years since quitting: 37.3  . Smokeless tobacco: Never Used  Substance and Sexual Activity  . Alcohol use: Yes    Comment: 05/22/2018 "glass of wine couple times/wk"  . Drug use: Not Currently  . Sexual activity: Not Currently    Birth control/protection: None, Post-menopausal  Lifestyle  . Physical activity:    Days per week: Not on file    Minutes per session: Not on file  . Stress: Not on file  Relationships  . Social connections:    Talks on phone: Not on file    Gets together: Not on file    Attends religious service: Not on file    Active member of club or organization: Not on file    Attends meetings of clubs or organizations: Not on file    Relationship status: Not on file  . Intimate partner violence:    Fear of current or ex partner: Not on file    Emotionally abused: Not on file    Physically abused: Not on file    Forced sexual activity: Not on file  Other Topics Concern  . Not on file  Social History Narrative  . Not on file    Physical Exam Neck:     Musculoskeletal: Normal range of motion.  Cardiovascular:     Rate and Rhythm: Normal rate and regular rhythm.     Pulses: Normal pulses.  Pulmonary:     Effort: Pulmonary effort is normal.     Breath sounds: Normal breath sounds.  Musculoskeletal: Normal range of motion.     Right lower leg: Edema present.     Left lower leg: Edema present.  Skin:    General: Skin is warm and dry.     Capillary Refill: Capillary refill takes less than 2 seconds.  Neurological:     Mental Status: She is alert and oriented to person, place, and time.  Psychiatric:        Mood and Affect: Mood normal.         Future Appointments  Date Time Provider [place]   12/02/2018  7:35 AM [facility]    BP (!) 81/44 (BP Location: Right Arm, Patient Position: Sitting, Cuff Size: Normal)   Pulse 66   Resp 18   Wt 202 lb 6.4 oz (91.8 kg)   SpO2  96%   BMI 34.74 kg/m   Weight yesterday- 201 lb Last visit weight- 205 lb  Ms Haught was seen at home today and reported feeling generally well. She denied chest pain, SOB, headache, orthopnea, cough or fever since our last visit. She did say she has intermittent dizziness which has been happening over the past couple of weeks. I contacted Darrick Grinder, NP, since we dealt with this issue last week and she advised to hold coreg tonight then decrease to 25 mg BID since she was not tolerating the 37.5 mg. Her medications were verified and her pillbox was refilled with the aforementioned adjustment. I will follow up next week.   [name], EMT 09/25/18  ACTION: Home visit completed Next visit planned for 1 week

## 2018-09-25 NOTE — Telephone Encounter (Signed)
I called Danielle [name] to schedule an appointment. She stated she was still in bed at the moment and would like me to come a little later in the day. I suggested 14:30 and she was agreeable.

## 2018-09-26 ENCOUNTER — Telehealth (HOSPITAL_COMMUNITY): Payer: Self-pay | Admitting: Licensed Clinical Social Worker

## 2018-09-26 NOTE — Telephone Encounter (Signed)
CSW reached out to pt to check in regarding food and medication status at this time. Pt reports she has everything she needs at this time.  Pt has been having some issues with dizziness which clinic is aware of and making adjustments for- pt planning to get some fresh air to help clear her head.  CSW encouraged pt to reach out with any concerns and will continue to follow and assist as needed  Jorge Ny, [place] Worker [facility] [phone]

## 2018-10-01 ENCOUNTER — Telehealth (HOSPITAL_COMMUNITY): Payer: Self-pay | Admitting: Licensed Clinical Social Worker

## 2018-10-01 NOTE — Telephone Encounter (Signed)
CSW reached out to pt to check in regarding food and medication status at this time. Pt reports that she has everything that she needs and has a daughter and friends who make sure she stays loaded up with groceries.  CSW inquired if patient was holding up with her mental health being more socially isolated and living alone but she states she sees her daughter regularly and has lots of phone calls with friends and other family that she doesn't feel lonely at all.  CSW encouraged pt to reach out with any concerns and will continue to follow and assist as needed  Jorge Ny, [place] Worker [facility] [phone]

## 2018-10-02 ENCOUNTER — Other Ambulatory Visit (HOSPITAL_COMMUNITY): Payer: Self-pay

## 2018-10-02 NOTE — Progress Notes (Signed)
Paramedicine Encounter    Patient ID: Danielle [name], female    DOB: 30-Oct-1945, 73 y.o.   MRN: [id number]   Patient Care Team: Seward Carol, MD as PCP - General (Internal Medicine) Jorge Ny, LCSW as Social Worker (Licensed Clinical Social Worker)  Patient Active Problem List   Diagnosis Date Noted  . Encephalopathy acute 05/20/2018  . 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 Acute kidney failure ([place]) 05/15/2018  . Primary localized osteoarthritis of right knee 05/13/2018  . (HFpEF) heart failure with preserved ejection fraction ([place]) 12/20/2017  . Obesity (BMI 30-39.9) 11/11/2017  . OSA (obstructive sleep apnea) 02/18/2017  . Essential hypertension   . Diabetes mellitus without complication ([place])   . Cancer ([place])   . Thyroid disease     Current Outpatient Medications:  .  acetaminophen (TYLENOL) 500 MG tablet, Take 500-1,000 mg by mouth every 6 (six) hours as needed (for pain or headaches)., Disp: , Rfl:  .  allopurinol (ZYLOPRIM) 100 MG tablet, Take 1 tablet (100 mg total) by mouth daily., Disp: , Rfl:  .  apixaban (ELIQUIS) 5 MG TABS tablet, Take 1 tablet (5 mg total) by mouth 2 (two) times daily., Disp: 180 tablet, Rfl: 3 .  atorvastatin (LIPITOR) 80 MG tablet, Take 1 tablet (80 mg total) by mouth at bedtime., Disp: 30 tablet, Rfl: 5 .  calcium citrate (CALCITRATE - DOSED IN MG ELEMENTAL CALCIUM) 950 MG tablet, Take 1 tablet by mouth 2 (two) times daily. , Disp: , Rfl:  .  carvedilol (COREG) 25 MG tablet, Take 1 tablet (25 mg total) by mouth 2 (two) times daily with a meal., Disp: 60 tablet, Rfl: 3 .  empagliflozin (JARDIANCE) 10 MG TABS tablet, Take 10 mg by mouth daily., Disp: 30 tablet, Rfl: 11 .  ferrous sulfate 325 (65 FE) MG tablet, Take 325 mg by mouth 2 (two) times daily with a meal., Disp: , Rfl:  .  gabapentin (NEURONTIN) 300 MG capsule, Take 300 mg by mouth daily. , Disp: , Rfl:  .  insulin glargine (LANTUS) 100 UNIT/ML injection,  Inject 20 Units into the skin 2 (two) times daily after a meal. , Disp: , Rfl:  .  isosorbide mononitrate (IMDUR) 30 MG 24 hr tablet, Take 1 tablet (30 mg total) by mouth daily., Disp: 30 tablet, Rfl: 3 .  levothyroxine (SYNTHROID, LEVOTHROID) 75 MCG tablet, Take 75 mcg by mouth daily before breakfast., Disp: , Rfl:  .  losartan (COZAAR) 50 MG tablet, Take 1 tablet (50 mg total) by mouth at bedtime., Disp: 30 tablet, Rfl: 6 .  Multiple Vitamins-Minerals (ALIVE ONCE DAILY WOMENS 50+ PO), Take 1 tablet by mouth daily., Disp: , Rfl:  .  nitroGLYCERIN (NITROSTAT) 0.4 MG SL tablet, DISSOLVE ONE TABLET UNDER THE TONGUE EVERY 5 MINUTES AS NEEDED FOR CHEST PAIN., Disp: 25 tablet, Rfl: 3 .  omeprazole (PRILOSEC) 20 MG capsule, Take 20 mg by mouth daily. , Disp: , Rfl:  .  potassium chloride (K-DUR,KLOR-CON) 10 MEQ tablet, Take 2 tablets (20 mEq total) by mouth daily., Disp: 60 tablet, Rfl: 5 .  spironolactone (ALDACTONE) 25 MG tablet, Take 1 tablet (25 mg total) by mouth every other day., Disp: 30 tablet, Rfl: 5 .  torsemide (DEMADEX) 20 MG tablet, Take 1 tablet (20 mg total) by mouth daily., Disp: 30 tablet, Rfl: 3 .  Travoprost, BAK Free, (TRAVATAN) 0.004 % SOLN ophthalmic solution, Place 1 drop into both eyes at bedtime. , Disp: , Rfl:  .  Biotin 10 MG CAPS, Take 10 mg by mouth daily., Disp: , Rfl:  .  CHONDROITIN SULFATE PO, Take 250 mg by mouth daily., Disp: , Rfl:  Allergies  Allergen Reactions  . Other Other (See Comments)    Blood products- Patient won't accept any  . Sulfa Antibiotics Rash  . Uloric [Febuxostat] Rash      Social History   Socioeconomic History  . Marital status: Widowed    Spouse name: Not on file  . Number of children: 3  . Years of education: Not on file  . Highest education level: Some college, no degree  Occupational History  . Occupation: retired  Scientific laboratory technician  . Financial resource strain: Not hard at all  . Food insecurity:    Worry: Never true     Inability: Never true  . Transportation needs:    Medical: No    Non-medical: No  Tobacco Use  . Smoking status: Former Smoker    Packs/day: 1.00    Years: 22.00    Pack years: 22.00    Types: Cigarettes    Last attempt to quit: 1983    Years since quitting: 37.3  . Smokeless tobacco: Never Used  Substance and Sexual Activity  . Alcohol use: Yes    Comment: 05/22/2018 "glass of wine couple times/wk"  . Drug use: Not Currently  . Sexual activity: Not Currently    Birth control/protection: None, Post-menopausal  Lifestyle  . Physical activity:    Days per week: Not on file    Minutes per session: Not on file  . Stress: Not on file  Relationships  . Social connections:    Talks on phone: Not on file    Gets together: Not on file    Attends religious service: Not on file    Active member of club or organization: Not on file    Attends meetings of clubs or organizations: Not on file    Relationship status: Not on file  . Intimate partner violence:    Fear of current or ex partner: Not on file    Emotionally abused: Not on file    Physically abused: Not on file    Forced sexual activity: Not on file  Other Topics Concern  . Not on file  Social History Narrative  . Not on file    Physical Exam Cardiovascular:     Rate and Rhythm: Normal rate and regular rhythm.     Pulses: Normal pulses.  Pulmonary:     Effort: Pulmonary effort is normal.     Breath sounds: Normal breath sounds.  Musculoskeletal: Normal range of motion.     Right lower leg: Edema present.     Left lower leg: Edema present.  Skin:    General: Skin is warm and dry.     Capillary Refill: Capillary refill takes less than 2 seconds.  Neurological:     Mental Status: She is alert and oriented to person, place, and time.  Psychiatric:        Mood and Affect: Mood normal.         Future Appointments  Date Time Provider [place]  12/02/2018  7:35 AM [facility] DEVICE REMOTES [facility]    BP (!) 109/59 (BP Location: Left Arm, Patient Position: Sitting, Cuff Size: Normal)   Pulse 67   Resp 18   Wt 204 lb (92.5 kg)   SpO2 100%   BMI 35.02 kg/m   Weight yesterday- 201 lb Last visit weight-  202.4 lb  Ms Olmsted was seen at home today and reported feeling generally well. She denied chest pain, SOB, headache, dizziness, orthopnea, cough or fever since our last visit. She stated she had been compliant with her medications over the past week but her weight increased 3 lb overnight.  She stated she has been taking metformin in the morning due to elevated blood glucose levels however her PCP has not told her to restart it. She also stated she only wanted to take gabapentin BID rather that TID as prescribed. I made the change to her gabapentin but advised that she needs to confirm the change to her metformin with her PCP. She stated she has a call into his office and is waiting to hear back. Her medications were verified and her pillbox was refilled. I will follow up next week.   [name], EMT 10/02/18  ACTION: Home visit completed Next visit planned for 1 week

## 2018-10-09 ENCOUNTER — Other Ambulatory Visit (HOSPITAL_COMMUNITY): Payer: Self-pay

## 2018-10-09 NOTE — Progress Notes (Signed)
Paramedicine Encounter    Patient ID: Danielle [name], female    DOB: 12-Jan-1946, 73 y.o.   MRN: [id number]   Patient Care Team: Seward Carol, MD as PCP - General (Internal Medicine) Jorge Ny, LCSW as Social Worker (Licensed Clinical Social Worker)  Patient Active Problem List   Diagnosis Date Noted  . Encephalopathy acute 05/20/2018  . 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 Acute kidney failure ([place]) 05/15/2018  . Primary localized osteoarthritis of right knee 05/13/2018  . (HFpEF) heart failure with preserved ejection fraction ([place]) 12/20/2017  . Obesity (BMI 30-39.9) 11/11/2017  . OSA (obstructive sleep apnea) 02/18/2017  . Essential hypertension   . Diabetes mellitus without complication ([place])   . Cancer ([place])   . Thyroid disease     Current Outpatient Medications:  .  acetaminophen (TYLENOL) 500 MG tablet, Take 500-1,000 mg by mouth every 6 (six) hours as needed (for pain or headaches)., Disp: , Rfl:  .  allopurinol (ZYLOPRIM) 100 MG tablet, Take 1 tablet (100 mg total) by mouth daily., Disp: , Rfl:  .  apixaban (ELIQUIS) 5 MG TABS tablet, Take 1 tablet (5 mg total) by mouth 2 (two) times daily., Disp: 180 tablet, Rfl: 3 .  atorvastatin (LIPITOR) 80 MG tablet, Take 1 tablet (80 mg total) by mouth at bedtime., Disp: 30 tablet, Rfl: 5 .  Biotin 10 MG CAPS, Take 10 mg by mouth daily., Disp: , Rfl:  .  calcium citrate (CALCITRATE - DOSED IN MG ELEMENTAL CALCIUM) 950 MG tablet, Take 1 tablet by mouth 2 (two) times daily. , Disp: , Rfl:  .  carvedilol (COREG) 25 MG tablet, Take 1 tablet (25 mg total) by mouth 2 (two) times daily with a meal., Disp: 60 tablet, Rfl: 3 .  empagliflozin (JARDIANCE) 10 MG TABS tablet, Take 10 mg by mouth daily., Disp: 30 tablet, Rfl: 11 .  ferrous sulfate 325 (65 FE) MG tablet, Take 325 mg by mouth 2 (two) times daily with a meal., Disp: , Rfl:  .  gabapentin (NEURONTIN) 300 MG capsule, Take 300 mg by mouth daily. ,  Disp: , Rfl:  .  insulin glargine (LANTUS) 100 UNIT/ML injection, Inject 20 Units into the skin 2 (two) times daily after a meal. , Disp: , Rfl:  .  isosorbide mononitrate (IMDUR) 30 MG 24 hr tablet, Take 1 tablet (30 mg total) by mouth daily., Disp: 30 tablet, Rfl: 3 .  levothyroxine (SYNTHROID, LEVOTHROID) 75 MCG tablet, Take 75 mcg by mouth daily before breakfast., Disp: , Rfl:  .  losartan (COZAAR) 50 MG tablet, Take 1 tablet (50 mg total) by mouth at bedtime., Disp: 30 tablet, Rfl: 6 .  Multiple Vitamins-Minerals (ALIVE ONCE DAILY WOMENS 50+ PO), Take 1 tablet by mouth daily., Disp: , Rfl:  .  nitroGLYCERIN (NITROSTAT) 0.4 MG SL tablet, DISSOLVE ONE TABLET UNDER THE TONGUE EVERY 5 MINUTES AS NEEDED FOR CHEST PAIN., Disp: 25 tablet, Rfl: 3 .  omeprazole (PRILOSEC) 20 MG capsule, Take 20 mg by mouth daily. , Disp: , Rfl:  .  potassium chloride (K-DUR,KLOR-CON) 10 MEQ tablet, Take 2 tablets (20 mEq total) by mouth daily., Disp: 60 tablet, Rfl: 5 .  spironolactone (ALDACTONE) 25 MG tablet, Take 1 tablet (25 mg total) by mouth every other day., Disp: 30 tablet, Rfl: 5 .  torsemide (DEMADEX) 20 MG tablet, Take 1 tablet (20 mg total) by mouth daily., Disp: 30 tablet, Rfl: 3 .  Travoprost, BAK Free, (TRAVATAN) 0.004 %  SOLN ophthalmic solution, Place 1 drop into both eyes at bedtime. , Disp: , Rfl:  .  CHONDROITIN SULFATE PO, Take 250 mg by mouth daily., Disp: , Rfl:  Allergies  Allergen Reactions  . Other Other (See Comments)    Blood products- Patient won't accept any  . Sulfa Antibiotics Rash  . Uloric [Febuxostat] Rash      Social History   Socioeconomic History  . Marital status: Widowed    Spouse name: Not on file  . Number of children: 3  . Years of education: Not on file  . Highest education level: Some college, no degree  Occupational History  . Occupation: retired  Scientific laboratory technician  . Financial resource strain: Not hard at all  . Food insecurity:    Worry: Never true     Inability: Never true  . Transportation needs:    Medical: No    Non-medical: No  Tobacco Use  . Smoking status: Former Smoker    Packs/day: 1.00    Years: 22.00    Pack years: 22.00    Types: Cigarettes    Last attempt to quit: 1983    Years since quitting: 37.3  . Smokeless tobacco: Never Used  Substance and Sexual Activity  . Alcohol use: Yes    Comment: 05/22/2018 "glass of wine couple times/wk"  . Drug use: Not Currently  . Sexual activity: Not Currently    Birth control/protection: None, Post-menopausal  Lifestyle  . Physical activity:    Days per week: Not on file    Minutes per session: Not on file  . Stress: Not on file  Relationships  . Social connections:    Talks on phone: Not on file    Gets together: Not on file    Attends religious service: Not on file    Active member of club or organization: Not on file    Attends meetings of clubs or organizations: Not on file    Relationship status: Not on file  . Intimate partner violence:    Fear of current or ex partner: Not on file    Emotionally abused: Not on file    Physically abused: Not on file    Forced sexual activity: Not on file  Other Topics Concern  . Not on file  Social History Narrative  . Not on file    Physical Exam Cardiovascular:     Rate and Rhythm: Normal rate and regular rhythm.     Pulses: Normal pulses.  Pulmonary:     Effort: Pulmonary effort is normal.     Breath sounds: Normal breath sounds.  Musculoskeletal: Normal range of motion.     Right lower leg: Edema present.     Left lower leg: Edema present.  Skin:    General: Skin is warm and dry.     Capillary Refill: Capillary refill takes less than 2 seconds.  Neurological:     Mental Status: She is alert and oriented to person, place, and time.  Psychiatric:        Mood and Affect: Mood normal.         Future Appointments  Date Time Provider [place]  12/02/2018  7:35 AM [facility] DEVICE REMOTES [facility]    BP 120/66 (BP Location: Right Arm, Patient Position: Sitting, Cuff Size: Normal)   Pulse 72   Resp 18   Wt 201 lb (91.2 kg)   SpO2 98%   BMI 34.50 kg/m   Weight yesterday- 203 lb Last visit  weight- 204 lb  Ms Helbig was seen at home today and reported feeling well. She denied chest pain, SOB, headache, dizziness, orthopnea, cough or fever. She stated she has been compliant with her medications over the past week and her weight has been stable. Her medications were verified and her pillbox was refilled. I will follow up next week.   [name], EMT 10/09/18  ACTION: Home visit completed Next visit planned for 1 week

## 2018-10-16 ENCOUNTER — Other Ambulatory Visit (HOSPITAL_COMMUNITY): Payer: Self-pay

## 2018-10-16 NOTE — Progress Notes (Signed)
Paramedicine Encounter    Patient ID: Danielle [name], female    DOB: 1945/09/29, 73 y.o.   MRN: [id number]   Patient Care Team: Seward Carol, MD as PCP - General (Internal Medicine) Jorge Ny, LCSW as Social Worker (Licensed Clinical Social Worker)  Patient Active Problem List   Diagnosis Date Noted  . Encephalopathy acute 05/20/2018  . 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 Acute kidney failure ([place]) 05/15/2018  . Primary localized osteoarthritis of right knee 05/13/2018  . (HFpEF) heart failure with preserved ejection fraction ([name]) 12/20/2017  . Obesity (BMI 30-39.9) 11/11/2017  . OSA (obstructive sleep apnea) 02/18/2017  . Essential hypertension   . Diabetes mellitus without complication ([place])   . Cancer ([place])   . Thyroid disease     Current Outpatient Medications:  .  acetaminophen (TYLENOL) 500 MG tablet, Take 500-1,000 mg by mouth every 6 (six) hours as needed (for pain or headaches)., Disp: , Rfl:  .  allopurinol (ZYLOPRIM) 100 MG tablet, Take 1 tablet (100 mg total) by mouth daily., Disp: , Rfl:  .  apixaban (ELIQUIS) 5 MG TABS tablet, Take 1 tablet (5 mg total) by mouth 2 (two) times daily., Disp: 180 tablet, Rfl: 3 .  atorvastatin (LIPITOR) 80 MG tablet, Take 1 tablet (80 mg total) by mouth at bedtime., Disp: 30 tablet, Rfl: 5 .  Biotin 10 MG CAPS, Take 10 mg by mouth daily., Disp: , Rfl:  .  calcium citrate (CALCITRATE - DOSED IN MG ELEMENTAL CALCIUM) 950 MG tablet, Take 1 tablet by mouth 2 (two) times daily. , Disp: , Rfl:  .  carvedilol (COREG) 25 MG tablet, Take 1 tablet (25 mg total) by mouth 2 (two) times daily with a meal., Disp: 60 tablet, Rfl: 3 .  empagliflozin (JARDIANCE) 10 MG TABS tablet, Take 10 mg by mouth daily., Disp: 30 tablet, Rfl: 11 .  ferrous sulfate 325 (65 FE) MG tablet, Take 325 mg by mouth 2 (two) times daily with a meal., Disp: , Rfl:  .  gabapentin (NEURONTIN) 300 MG capsule, Take 300 mg by mouth daily. ,  Disp: , Rfl:  .  insulin glargine (LANTUS) 100 UNIT/ML injection, Inject 20 Units into the skin 2 (two) times daily after a meal. , Disp: , Rfl:  .  isosorbide mononitrate (IMDUR) 30 MG 24 hr tablet, Take 1 tablet (30 mg total) by mouth daily., Disp: 30 tablet, Rfl: 3 .  levothyroxine (SYNTHROID, LEVOTHROID) 75 MCG tablet, Take 75 mcg by mouth daily before breakfast., Disp: , Rfl:  .  losartan (COZAAR) 50 MG tablet, Take 1 tablet (50 mg total) by mouth at bedtime., Disp: 30 tablet, Rfl: 6 .  Multiple Vitamins-Minerals (ALIVE ONCE DAILY WOMENS 50+ PO), Take 1 tablet by mouth daily., Disp: , Rfl:  .  nitroGLYCERIN (NITROSTAT) 0.4 MG SL tablet, DISSOLVE ONE TABLET UNDER THE TONGUE EVERY 5 MINUTES AS NEEDED FOR CHEST PAIN., Disp: 25 tablet, Rfl: 3 .  omeprazole (PRILOSEC) 20 MG capsule, Take 20 mg by mouth daily. , Disp: , Rfl:  .  potassium chloride (K-DUR,KLOR-CON) 10 MEQ tablet, Take 2 tablets (20 mEq total) by mouth daily., Disp: 60 tablet, Rfl: 5 .  spironolactone (ALDACTONE) 25 MG tablet, Take 1 tablet (25 mg total) by mouth every other day., Disp: 30 tablet, Rfl: 5 .  torsemide (DEMADEX) 20 MG tablet, Take 1 tablet (20 mg total) by mouth daily., Disp: 30 tablet, Rfl: 3 .  Travoprost, BAK Free, (TRAVATAN) 0.004 %  SOLN ophthalmic solution, Place 1 drop into both eyes at bedtime. , Disp: , Rfl:  .  CHONDROITIN SULFATE PO, Take 250 mg by mouth daily., Disp: , Rfl:  Allergies  Allergen Reactions  . Other Other (See Comments)    Blood products- Patient won't accept any  . Sulfa Antibiotics Rash  . Uloric [Febuxostat] Rash      Social History   Socioeconomic History  . Marital status: Widowed    Spouse name: Not on file  . Number of children: 3  . Years of education: Not on file  . Highest education level: Some college, no degree  Occupational History  . Occupation: retired  Scientific laboratory technician  . Financial resource strain: Not hard at all  . Food insecurity:    Worry: Never true     Inability: Never true  . Transportation needs:    Medical: No    Non-medical: No  Tobacco Use  . Smoking status: Former Smoker    Packs/day: 1.00    Years: 22.00    Pack years: 22.00    Types: Cigarettes    Last attempt to quit: 1983    Years since quitting: 37.4  . Smokeless tobacco: Never Used  Substance and Sexual Activity  . Alcohol use: Yes    Comment: 05/22/2018 "glass of wine couple times/wk"  . Drug use: Not Currently  . Sexual activity: Not Currently    Birth control/protection: None, Post-menopausal  Lifestyle  . Physical activity:    Days per week: Not on file    Minutes per session: Not on file  . Stress: Not on file  Relationships  . Social connections:    Talks on phone: Not on file    Gets together: Not on file    Attends religious service: Not on file    Active member of club or organization: Not on file    Attends meetings of clubs or organizations: Not on file    Relationship status: Not on file  . Intimate partner violence:    Fear of current or ex partner: Not on file    Emotionally abused: Not on file    Physically abused: Not on file    Forced sexual activity: Not on file  Other Topics Concern  . Not on file  Social History Narrative  . Not on file    Physical Exam Cardiovascular:     Rate and Rhythm: Normal rate and regular rhythm.     Pulses: Normal pulses.  Pulmonary:     Effort: Pulmonary effort is normal.     Breath sounds: Normal breath sounds.  Musculoskeletal: Normal range of motion.     Right lower leg: Edema present.     Left lower leg: Edema present.  Skin:    General: Skin is warm and dry.     Capillary Refill: Capillary refill takes less than 2 seconds.  Neurological:     Mental Status: She is alert and oriented to person, place, and time.  Psychiatric:        Mood and Affect: Mood normal.         Future Appointments  Date Time Provider [place]  12/02/2018  7:35 AM [facility] DEVICE REMOTES [facility]    BP (!) 101/58 (BP Location: Right Arm, Patient Position: Sitting, Cuff Size: Normal)   Pulse 70   Resp 18   Wt 205 lb (93 kg)   SpO2 99%   BMI 35.19 kg/m   Weight yesterday- 206 lb Last  visit weight- 201 lb  Danielle [name] was seen at home today and reported feeling well. She denied chest pain, SOB, headache, dizziness, orthopnea, cough or fever since our last visit. She stated she had been compliant with her medications over the past week and her weight has been stable. Upon assessment, her weight was found to be up 4 lb since last week but she stated she has not been having regular bowel movements. He abdomen is not notable distended, rigid or tender to palpate. Her medications were verified and her pillbox was refilled. Allopurinol was ordered from the pharmacy and will be available for pick up tomorrow, though she does not need it in her pillbox this week. I will follow up next week.   [name], EMT 10/16/18  ACTION: Home visit completed Next visit planned for 1 week

## 2018-10-21 ENCOUNTER — Other Ambulatory Visit: Payer: Self-pay | Admitting: Internal Medicine

## 2018-10-21 ENCOUNTER — Other Ambulatory Visit: Payer: Self-pay | Admitting: Obstetrics and Gynecology

## 2018-10-21 DIAGNOSIS — Z1231 Encounter for screening mammogram for malignant neoplasm of breast: Secondary | ICD-10-CM

## 2018-10-23 ENCOUNTER — Other Ambulatory Visit (HOSPITAL_COMMUNITY): Payer: Self-pay

## 2018-10-23 ENCOUNTER — Telehealth (HOSPITAL_COMMUNITY): Payer: Self-pay

## 2018-10-23 NOTE — Telephone Encounter (Signed)
I called Danielle [name] to schedule an appointment. She did not answer and her machine malfunctioned which did not allow me time to leave a voicemail. I will reach out to her again this afternoon.

## 2018-10-23 NOTE — Progress Notes (Signed)
Paramedicine Encounter    Patient ID: Danielle [name], female    DOB: Jun 01, 1945, 73 y.o.   MRN: [id number]   Patient Care Team: Seward Carol, MD as PCP - General (Internal Medicine) Jorge Ny, LCSW as Social Worker (Licensed Clinical Social Worker)  Patient Active Problem List   Diagnosis Date Noted  . Encephalopathy acute 05/20/2018  . 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 Acute kidney failure ([place]) 05/15/2018  . Primary localized osteoarthritis of right knee 05/13/2018  . (HFpEF) heart failure with preserved ejection fraction ([place]) 12/20/2017  . Obesity (BMI 30-39.9) 11/11/2017  . OSA (obstructive sleep apnea) 02/18/2017  . Essential hypertension   . Diabetes mellitus without complication ([place])   . Cancer ([place])   . Thyroid disease     Current Outpatient Medications:  .  acetaminophen (TYLENOL) 500 MG tablet, Take 500-1,000 mg by mouth every 6 (six) hours as needed (for pain or headaches)., Disp: , Rfl:  .  allopurinol (ZYLOPRIM) 100 MG tablet, Take 1 tablet (100 mg total) by mouth daily., Disp: , Rfl:  .  apixaban (ELIQUIS) 5 MG TABS tablet, Take 1 tablet (5 mg total) by mouth 2 (two) times daily., Disp: 180 tablet, Rfl: 3 .  atorvastatin (LIPITOR) 80 MG tablet, Take 1 tablet (80 mg total) by mouth at bedtime., Disp: 30 tablet, Rfl: 5 .  Biotin 10 MG CAPS, Take 10 mg by mouth daily., Disp: , Rfl:  .  calcium citrate (CALCITRATE - DOSED IN MG ELEMENTAL CALCIUM) 950 MG tablet, Take 1 tablet by mouth 2 (two) times daily. , Disp: , Rfl:  .  carvedilol (COREG) 25 MG tablet, Take 1 tablet (25 mg total) by mouth 2 (two) times daily with a meal., Disp: 60 tablet, Rfl: 3 .  empagliflozin (JARDIANCE) 10 MG TABS tablet, Take 10 mg by mouth daily., Disp: 30 tablet, Rfl: 11 .  ferrous sulfate 325 (65 FE) MG tablet, Take 325 mg by mouth 2 (two) times daily with a meal., Disp: , Rfl:  .  gabapentin (NEURONTIN) 300 MG capsule, Take 300 mg by mouth daily. ,  Disp: , Rfl:  .  insulin glargine (LANTUS) 100 UNIT/ML injection, Inject 20 Units into the skin 2 (two) times daily after a meal. , Disp: , Rfl:  .  isosorbide mononitrate (IMDUR) 30 MG 24 hr tablet, Take 1 tablet (30 mg total) by mouth daily., Disp: 30 tablet, Rfl: 3 .  levothyroxine (SYNTHROID, LEVOTHROID) 75 MCG tablet, Take 75 mcg by mouth daily before breakfast., Disp: , Rfl:  .  losartan (COZAAR) 50 MG tablet, Take 1 tablet (50 mg total) by mouth at bedtime., Disp: 30 tablet, Rfl: 6 .  Multiple Vitamins-Minerals (ALIVE ONCE DAILY WOMENS 50+ PO), Take 1 tablet by mouth daily., Disp: , Rfl:  .  nitroGLYCERIN (NITROSTAT) 0.4 MG SL tablet, DISSOLVE ONE TABLET UNDER THE TONGUE EVERY 5 MINUTES AS NEEDED FOR CHEST PAIN., Disp: 25 tablet, Rfl: 3 .  omeprazole (PRILOSEC) 20 MG capsule, Take 20 mg by mouth daily. , Disp: , Rfl:  .  potassium chloride (K-DUR,KLOR-CON) 10 MEQ tablet, Take 2 tablets (20 mEq total) by mouth daily., Disp: 60 tablet, Rfl: 5 .  spironolactone (ALDACTONE) 25 MG tablet, Take 1 tablet (25 mg total) by mouth every other day., Disp: 30 tablet, Rfl: 5 .  torsemide (DEMADEX) 20 MG tablet, Take 1 tablet (20 mg total) by mouth daily., Disp: 30 tablet, Rfl: 3 .  Travoprost, BAK Free, (TRAVATAN) 0.004 %  SOLN ophthalmic solution, Place 1 drop into both eyes at bedtime. , Disp: , Rfl:  .  CHONDROITIN SULFATE PO, Take 250 mg by mouth daily., Disp: , Rfl:  Allergies  Allergen Reactions  . Other Other (See Comments)    Blood products- Patient won't accept any  . Sulfa Antibiotics Rash  . Uloric [Febuxostat] Rash      Social History   Socioeconomic History  . Marital status: Widowed    Spouse name: Not on file  . Number of children: 3  . Years of education: Not on file  . Highest education level: Some college, no degree  Occupational History  . Occupation: retired  Scientific laboratory technician  . Financial resource strain: Not hard at all  . Food insecurity:    Worry: Never true     Inability: Never true  . Transportation needs:    Medical: No    Non-medical: No  Tobacco Use  . Smoking status: Former Smoker    Packs/day: 1.00    Years: 22.00    Pack years: 22.00    Types: Cigarettes    Last attempt to quit: 1983    Years since quitting: 37.4  . Smokeless tobacco: Never Used  Substance and Sexual Activity  . Alcohol use: Yes    Comment: 05/22/2018 "glass of wine couple times/wk"  . Drug use: Not Currently  . Sexual activity: Not Currently    Birth control/protection: None, Post-menopausal  Lifestyle  . Physical activity:    Days per week: Not on file    Minutes per session: Not on file  . Stress: Not on file  Relationships  . Social connections:    Talks on phone: Not on file    Gets together: Not on file    Attends religious service: Not on file    Active member of club or organization: Not on file    Attends meetings of clubs or organizations: Not on file    Relationship status: Not on file  . Intimate partner violence:    Fear of current or ex partner: Not on file    Emotionally abused: Not on file    Physically abused: Not on file    Forced sexual activity: Not on file  Other Topics Concern  . Not on file  Social History Narrative  . Not on file    Physical Exam Cardiovascular:     Rate and Rhythm: Normal rate and regular rhythm.     Pulses: Normal pulses.  Pulmonary:     Effort: Pulmonary effort is normal.     Breath sounds: Normal breath sounds.  Musculoskeletal: Normal range of motion.     Right lower leg: Edema present.     Left lower leg: Edema present.  Skin:    General: Skin is warm and dry.     Capillary Refill: Capillary refill takes less than 2 seconds.  Neurological:     Mental Status: She is alert and oriented to person, place, and time.  Psychiatric:        Mood and Affect: Mood normal.         Future Appointments  Date Time Provider [place]  12/02/2018  7:35 AM [facility] DEVICE REMOTES [facility]  12/08/2018 11:50 AM [facility] MM 3 [facility]    BP (!) 96/56 (BP Location: Right Arm, Patient Position: Sitting, Cuff Size: Normal)   Pulse 65   Resp 16   Wt 208 lb (94.3 kg)   SpO2 98%  BMI 35.70 kg/m   Weight yesterday- 207 lb Last visit weight- 205 lb  Ms Rizzolo was seen at home today and reported feeling well. She denied chest pain, SOB, headache, dizziness, orthopnea, cough or fever since our last visit. She reporrted having an episode of chest palpitations over the weeknd and promptly transmitted her device to the clinic. She did not hear back from the clinic and she has not had any issues since. I advised that if this happens again, she should transmit again and follow up by calling the clinic. She was understanding and agreeable. She stated she had been compliant with her medications and her weight has been generally stable. Her medications were verified and her pillbox was refilled.   [name], EMT 10/23/18  ACTION: Home visit completed Next visit planned for 1 week

## 2018-10-30 ENCOUNTER — Telehealth (HOSPITAL_COMMUNITY): Payer: Self-pay | Admitting: *Deleted

## 2018-10-30 ENCOUNTER — Other Ambulatory Visit (HOSPITAL_COMMUNITY): Payer: Self-pay

## 2018-10-30 NOTE — Progress Notes (Signed)
Paramedicine Encounter    Patient ID: Danielle [name], female    DOB: Aug 31, 1945, 73 y.o.   MRN: [id number]   Patient Care Team: Seward Carol, MD as PCP - General (Internal Medicine) Jorge Ny, LCSW as Social Worker (Licensed Clinical Social Worker)  Patient Active Problem List   Diagnosis Date Noted  . Encephalopathy acute 05/20/2018  . 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 Acute kidney failure ([place]) 05/15/2018  . Primary localized osteoarthritis of right knee 05/13/2018  . (HFpEF) heart failure with preserved ejection fraction ([place]) 12/20/2017  . Obesity (BMI 30-39.9) 11/11/2017  . OSA (obstructive sleep apnea) 02/18/2017  . Essential hypertension   . Diabetes mellitus without complication ([place])   . Cancer ([place])   . Thyroid disease     Current Outpatient Medications:  .  acetaminophen (TYLENOL) 500 MG tablet, Take 500-1,000 mg by mouth every 6 (six) hours as needed (for pain or headaches)., Disp: , Rfl:  .  allopurinol (ZYLOPRIM) 100 MG tablet, Take 1 tablet (100 mg total) by mouth daily., Disp: , Rfl:  .  apixaban (ELIQUIS) 5 MG TABS tablet, Take 1 tablet (5 mg total) by mouth 2 (two) times daily., Disp: 180 tablet, Rfl: 3 .  atorvastatin (LIPITOR) 80 MG tablet, Take 1 tablet (80 mg total) by mouth at bedtime., Disp: 30 tablet, Rfl: 5 .  Biotin 10 MG CAPS, Take 10 mg by mouth daily., Disp: , Rfl:  .  calcium citrate (CALCITRATE - DOSED IN MG ELEMENTAL CALCIUM) 950 MG tablet, Take 1 tablet by mouth 2 (two) times daily. , Disp: , Rfl:  .  carvedilol (COREG) 25 MG tablet, Take 1 tablet (25 mg total) by mouth 2 (two) times daily with a meal., Disp: 60 tablet, Rfl: 3 .  empagliflozin (JARDIANCE) 10 MG TABS tablet, Take 10 mg by mouth daily., Disp: 30 tablet, Rfl: 11 .  ferrous sulfate 325 (65 FE) MG tablet, Take 325 mg by mouth 2 (two) times daily with a meal., Disp: , Rfl:  .  gabapentin (NEURONTIN) 300 MG capsule, Take 300 mg by mouth daily. ,  Disp: , Rfl:  .  insulin glargine (LANTUS) 100 UNIT/ML injection, Inject 20 Units into the skin 2 (two) times daily after a meal. , Disp: , Rfl:  .  isosorbide mononitrate (IMDUR) 30 MG 24 hr tablet, Take 1 tablet (30 mg total) by mouth daily., Disp: 30 tablet, Rfl: 3 .  levothyroxine (SYNTHROID, LEVOTHROID) 75 MCG tablet, Take 75 mcg by mouth daily before breakfast., Disp: , Rfl:  .  losartan (COZAAR) 50 MG tablet, Take 1 tablet (50 mg total) by mouth at bedtime., Disp: 30 tablet, Rfl: 6 .  Multiple Vitamins-Minerals (ALIVE ONCE DAILY WOMENS 50+ PO), Take 1 tablet by mouth daily., Disp: , Rfl:  .  nitroGLYCERIN (NITROSTAT) 0.4 MG SL tablet, DISSOLVE ONE TABLET UNDER THE TONGUE EVERY 5 MINUTES AS NEEDED FOR CHEST PAIN., Disp: 25 tablet, Rfl: 3 .  omeprazole (PRILOSEC) 20 MG capsule, Take 20 mg by mouth daily. , Disp: , Rfl:  .  potassium chloride (K-DUR,KLOR-CON) 10 MEQ tablet, Take 2 tablets (20 mEq total) by mouth daily., Disp: 60 tablet, Rfl: 5 .  spironolactone (ALDACTONE) 25 MG tablet, Take 1 tablet (25 mg total) by mouth every other day., Disp: 30 tablet, Rfl: 5 .  torsemide (DEMADEX) 20 MG tablet, Take 1 tablet (20 mg total) by mouth daily., Disp: 30 tablet, Rfl: 3 .  Travoprost, BAK Free, (TRAVATAN) 0.004 %  SOLN ophthalmic solution, Place 1 drop into both eyes at bedtime. , Disp: , Rfl:  .  CHONDROITIN SULFATE PO, Take 250 mg by mouth daily., Disp: , Rfl:  Allergies  Allergen Reactions  . Other Other (See Comments)    Blood products- Patient won't accept any  . Sulfa Antibiotics Rash  . Uloric [Febuxostat] Rash      Social History   Socioeconomic History  . Marital status: Widowed    Spouse name: Not on file  . Number of children: 3  . Years of education: Not on file  . Highest education level: Some college, no degree  Occupational History  . Occupation: retired  Scientific laboratory technician  . Financial resource strain: Not hard at all  . Food insecurity:    Worry: Never true     Inability: Never true  . Transportation needs:    Medical: No    Non-medical: No  Tobacco Use  . Smoking status: Former Smoker    Packs/day: 1.00    Years: 22.00    Pack years: 22.00    Types: Cigarettes    Last attempt to quit: 1983    Years since quitting: 37.4  . Smokeless tobacco: Never Used  Substance and Sexual Activity  . Alcohol use: Yes    Comment: 05/22/2018 "glass of wine couple times/wk"  . Drug use: Not Currently  . Sexual activity: Not Currently    Birth control/protection: None, Post-menopausal  Lifestyle  . Physical activity:    Days per week: Not on file    Minutes per session: Not on file  . Stress: Not on file  Relationships  . Social connections:    Talks on phone: Not on file    Gets together: Not on file    Attends religious service: Not on file    Active member of club or organization: Not on file    Attends meetings of clubs or organizations: Not on file    Relationship status: Not on file  . Intimate partner violence:    Fear of current or ex partner: Not on file    Emotionally abused: Not on file    Physically abused: Not on file    Forced sexual activity: Not on file  Other Topics Concern  . Not on file  Social History Narrative  . Not on file    Physical Exam Cardiovascular:     Rate and Rhythm: Normal rate and regular rhythm.     Pulses: Normal pulses.  Pulmonary:     Effort: Pulmonary effort is normal.     Breath sounds: Normal breath sounds.  Musculoskeletal: Normal range of motion.     Right lower leg: Edema present.     Left lower leg: Edema present.  Skin:    General: Skin is warm.     Capillary Refill: Capillary refill takes less than 2 seconds.  Neurological:     Mental Status: She is alert and oriented to person, place, and time.  Psychiatric:        Mood and Affect: Mood normal.         Future Appointments  Date Time Provider [place]  12/02/2018  7:35 AM [facility]ICE [facility]OTES [facility]  12/08/2018 11:50 AM [facility] MM [facility]    BP 111/67 (BP Location: Right Arm, Patient Position: Sitting, Cuff Size: Normal)   Pulse 70   Resp 16   Wt 210 lb (95.3 kg)   SpO2 98%   BMI 36.05 kg/m  Weight yesterday- 207 lb Last visit weight- 208 lb  Danielle [name] was seen at home today and reported feeling well. She denied chest pain, SOB, headache, dizziness, orthopnea, cough or fever since our last visit. Her only complaint today was body aches from "the changing weather" but she said that has been happening for a while. She stated she has been compliant with her medications over the past week and her weight has been stable. Her medications were verified and her pillbox was refilled. I will follow up next week.   [name], EMT 10/30/18  ACTION: Home visit completed Next visit planned for 1 week

## 2018-10-30 NOTE — Telephone Encounter (Signed)
Zack called to report pts 3lbs weight gain overnight. Pt has mild BLEE. No other complaints at this time. Pt advised to take PRN PM 20mg  of torsemide. Zack aware and agreeable with plan. Cosigned [name] Ninfa Meeker, NP-C

## 2018-11-06 ENCOUNTER — Other Ambulatory Visit (HOSPITAL_COMMUNITY): Payer: Self-pay

## 2018-11-06 DIAGNOSIS — Z961 Presence of intraocular lens: Secondary | ICD-10-CM | POA: Diagnosis not present

## 2018-11-06 DIAGNOSIS — H3561 Retinal hemorrhage, right eye: Secondary | ICD-10-CM | POA: Diagnosis not present

## 2018-11-06 DIAGNOSIS — H3562 Retinal hemorrhage, left eye: Secondary | ICD-10-CM | POA: Diagnosis not present

## 2018-11-06 DIAGNOSIS — E113493 Type 2 diabetes mellitus with severe nonproliferative diabetic retinopathy without macular edema, bilateral: Secondary | ICD-10-CM | POA: Diagnosis not present

## 2018-11-06 NOTE — Progress Notes (Signed)
Paramedicine Encounter    Patient ID: Danielle [name], female    DOB: [birth year], 73 y.o.   MRN: [id number]    Patient Care Team: Seward Carol, MD as PCP - General (Internal Medicine) Jorge Ny, LCSW as Social Worker (Licensed Clinical Social Worker)  Patient Active Problem List   Diagnosis Date Noted  . Encephalopathy acute 05/20/2018  . 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 Acute kidney failure ([place]) 05/15/2018  . Primary localized osteoarthritis of right knee 05/13/2018  . (HFpEF) heart failure with preserved ejection fraction ([place]) 12/20/2017  . Obesity (BMI 30-39.9) 11/11/2017  . OSA (obstructive sleep apnea) 02/18/2017  . Essential hypertension   . Diabetes mellitus without complication ([place])   . Cancer ([place])   . Thyroid disease     Current Outpatient Medications:  .  allopurinol (ZYLOPRIM) 100 MG tablet, Take 1 tablet (100 mg total) by mouth daily., Disp: , Rfl:  .  apixaban (ELIQUIS) 5 MG TABS tablet, Take 1 tablet (5 mg total) by mouth 2 (two) times daily., Disp: 180 tablet, Rfl: 3 .  atorvastatin (LIPITOR) 80 MG tablet, Take 1 tablet (80 mg total) by mouth at bedtime., Disp: 30 tablet, Rfl: 5 .  Biotin 10 MG CAPS, Take 10 mg by mouth daily., Disp: , Rfl:  .  calcium citrate (CALCITRATE - DOSED IN MG ELEMENTAL CALCIUM) 950 MG tablet, Take 1 tablet by mouth 2 (two) times daily. , Disp: , Rfl:  .  carvedilol (COREG) 25 MG tablet, Take 1 tablet (25 mg total) by mouth 2 (two) times daily with a meal., Disp: 60 tablet, Rfl: 3 .  empagliflozin (JARDIANCE) 10 MG TABS tablet, Take 10 mg by mouth daily., Disp: 30 tablet, Rfl: 11 .  ferrous sulfate 325 (65 FE) MG tablet, Take 325 mg by mouth 2 (two) times daily with a meal., Disp: , Rfl:  .  gabapentin (NEURONTIN) 300 MG capsule, Take 300 mg by mouth daily. , Disp: , Rfl:  .  isosorbide mononitrate (IMDUR) 30 MG 24 hr tablet, Take 1 tablet (30 mg total) by mouth daily., Disp: 30 tablet, Rfl: 3 .   levothyroxine (SYNTHROID, LEVOTHROID) 75 MCG tablet, Take 75 mcg by mouth daily before breakfast., Disp: , Rfl:  .  losartan (COZAAR) 50 MG tablet, Take 1 tablet (50 mg total) by mouth at bedtime., Disp: 30 tablet, Rfl: 6 .  Multiple Vitamins-Minerals (ALIVE ONCE DAILY WOMENS 50+ PO), Take 1 tablet by mouth daily., Disp: , Rfl:  .  omeprazole (PRILOSEC) 20 MG capsule, Take 20 mg by mouth daily. , Disp: , Rfl:  .  potassium chloride (K-DUR,KLOR-CON) 10 MEQ tablet, Take 2 tablets (20 mEq total) by mouth daily., Disp: 60 tablet, Rfl: 5 .  spironolactone (ALDACTONE) 25 MG tablet, Take 1 tablet (25 mg total) by mouth every other day., Disp: 30 tablet, Rfl: 5 .  torsemide (DEMADEX) 20 MG tablet, Take 1 tablet (20 mg total) by mouth daily., Disp: 30 tablet, Rfl: 3 .  acetaminophen (TYLENOL) 500 MG tablet, Take 500-1,000 mg by mouth every 6 (six) hours as needed (for pain or headaches)., Disp: , Rfl:  .  CHONDROITIN SULFATE PO, Take 250 mg by mouth daily., Disp: , Rfl:  .  insulin glargine (LANTUS) 100 UNIT/ML injection, Inject 20 Units into the skin 2 (two) times daily after a meal. , Disp: , Rfl:  .  nitroGLYCERIN (NITROSTAT) 0.4 MG SL tablet, DISSOLVE ONE TABLET UNDER THE TONGUE EVERY 5 MINUTES AS  NEEDED FOR CHEST PAIN., Disp: 25 tablet, Rfl: 3 .  Travoprost, BAK Free, (TRAVATAN) 0.004 % SOLN ophthalmic solution, Place 1 drop into both eyes at bedtime. , Disp: , Rfl:  Allergies  Allergen Reactions  . Other Other (See Comments)    Blood products- Patient won't accept any  . Sulfa Antibiotics Rash  . Uloric [Febuxostat] Rash     Social History   Socioeconomic History  . Marital status: Widowed    Spouse name: Not on file  . Number of children: 3  . Years of education: Not on file  . Highest education level: Some college, no degree  Occupational History  . Occupation: retired  Scientific laboratory technician  . Financial resource strain: Not hard at all  . Food insecurity    Worry: Never true    Inability:  Never true  . Transportation needs    Medical: No    Non-medical: No  Tobacco Use  . Smoking status: Former Smoker    Packs/day: 1.00    Years: 22.00    Pack years: 22.00    Types: Cigarettes    Quit date: 1983    Years since quitting: 37.4  . Smokeless tobacco: Never Used  Substance and Sexual Activity  . Alcohol use: Yes    Comment: 05/22/2018 "glass of wine couple times/wk"  . Drug use: Not Currently  . Sexual activity: Not Currently    Birth control/protection: None, Post-menopausal  Lifestyle  . Physical activity    Days per week: Not on file    Minutes per session: Not on file  . Stress: Not on file  Relationships  . Social Herbalist on phone: Not on file    Gets together: Not on file    Attends religious service: Not on file    Active member of club or organization: Not on file    Attends meetings of clubs or organizations: Not on file    Relationship status: Not on file  . Intimate partner violence    Fear of current or ex partner: Not on file    Emotionally abused: Not on file    Physically abused: Not on file    Forced sexual activity: Not on file  Other Topics Concern  . Not on file  Social History Narrative  . Not on file    Physical Exam Pulmonary:     Effort: Pulmonary effort is normal. No respiratory distress.     Breath sounds: No wheezing or rales.  Abdominal:     General: There is no distension.     Palpations: Abdomen is soft.         Future Appointments  Date Time Provider [place]  12/02/2018  7:35 AM [facility] DEVICE REMOTES [facility]  12/08/2018 11:50 AM [facility] MM 3 [facility]     BP (!) 99/54 (BP Location: Left Arm, Patient Position: Sitting, Cuff Size: Large)   Pulse 67   Resp 16   Wt 206 lb (93.4 kg)   SpO2 98%   BMI 35.36 kg/m   Weight yesterday-didn't remember Last visit weight-210  ATF pt CAO x4, she just came home from an eye appointment prior to our arrival.  [name]  advised Mrs. Danielle [name] during this visit that I will take over her care. She has taken her meds for this week. She denies sob, chest pain and dizziness. She said that her joints are aching due to the weather, but need for concern.  [name] confirmed the appearance  of her feet and legs were her normal and looked good. Rx bottles confirmed and pill box refilled.   She's still taking metformin because she had issues with her blood sugar in the pas; she is also taking Gabapentin BID, Dr. Delfina Redwood her pcp is aware of both med changes. She and I agreed on Thursday visits.        Medication ordered:  None  Massey Ruhland, EMT Paramedic [phone] 11/06/2018    ACTION: Home visit completed

## 2018-11-13 ENCOUNTER — Other Ambulatory Visit (HOSPITAL_COMMUNITY): Payer: Self-pay

## 2018-11-13 NOTE — Progress Notes (Signed)
Paramedicine Encounter    Patient ID: Danielle [name], female    DOB: 05-30-1945, 73 y.o.   MRN: [id number]    Patient Care Team: Seward Carol, MD as PCP - General (Internal Medicine) Jorge Ny, LCSW as Social Worker (Licensed Clinical Social Worker)  Patient Active Problem List   Diagnosis Date Noted  . Encephalopathy acute 05/20/2018  . 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 Acute kidney failure ([place]) 05/15/2018  . Primary localized osteoarthritis of right knee 05/13/2018  . (HFpEF) heart failure with preserved ejection fraction ([place]) 12/20/2017  . Obesity (BMI 30-39.9) 11/11/2017  . OSA (obstructive sleep apnea) 02/18/2017  . Essential hypertension   . Diabetes mellitus without complication ([place])   . Cancer ([place])   . Thyroid disease     Current Outpatient Medications:  .  allopurinol (ZYLOPRIM) 100 MG tablet, Take 1 tablet (100 mg total) by mouth daily., Disp: , Rfl:  .  apixaban (ELIQUIS) 5 MG TABS tablet, Take 1 tablet (5 mg total) by mouth 2 (two) times daily., Disp: 180 tablet, Rfl: 3 .  atorvastatin (LIPITOR) 80 MG tablet, Take 1 tablet (80 mg total) by mouth at bedtime., Disp: 30 tablet, Rfl: 5 .  Biotin 10 MG CAPS, Take 10 mg by mouth daily., Disp: , Rfl:  .  calcium citrate (CALCITRATE - DOSED IN MG ELEMENTAL CALCIUM) 950 MG tablet, Take 1 tablet by mouth 2 (two) times daily. , Disp: , Rfl:  .  carvedilol (COREG) 25 MG tablet, Take 1 tablet (25 mg total) by mouth 2 (two) times daily with a meal., Disp: 60 tablet, Rfl: 3 .  empagliflozin (JARDIANCE) 10 MG TABS tablet, Take 10 mg by mouth daily., Disp: 30 tablet, Rfl: 11 .  ferrous sulfate 325 (65 FE) MG tablet, Take 325 mg by mouth 2 (two) times daily with a meal., Disp: , Rfl:  .  gabapentin (NEURONTIN) 300 MG capsule, Take 300 mg by mouth daily. , Disp: , Rfl:  .  isosorbide mononitrate (IMDUR) 30 MG 24 hr tablet, Take 1 tablet (30 mg total) by mouth daily., Disp: 30 tablet, Rfl: 3 .   levothyroxine (SYNTHROID, LEVOTHROID) 75 MCG tablet, Take 75 mcg by mouth daily before breakfast., Disp: , Rfl:  .  losartan (COZAAR) 50 MG tablet, Take 1 tablet (50 mg total) by mouth at bedtime., Disp: 30 tablet, Rfl: 6 .  Multiple Vitamins-Minerals (ALIVE ONCE DAILY WOMENS 50+ PO), Take 1 tablet by mouth daily., Disp: , Rfl:  .  omeprazole (PRILOSEC) 20 MG capsule, Take 20 mg by mouth daily. , Disp: , Rfl:  .  potassium chloride (K-DUR,KLOR-CON) 10 MEQ tablet, Take 2 tablets (20 mEq total) by mouth daily., Disp: 60 tablet, Rfl: 5 .  spironolactone (ALDACTONE) 25 MG tablet, Take 1 tablet (25 mg total) by mouth every other day., Disp: 30 tablet, Rfl: 5 .  torsemide (DEMADEX) 20 MG tablet, Take 1 tablet (20 mg total) by mouth daily., Disp: 30 tablet, Rfl: 3 .  acetaminophen (TYLENOL) 500 MG tablet, Take 500-1,000 mg by mouth every 6 (six) hours as needed (for pain or headaches)., Disp: , Rfl:  .  CHONDROITIN SULFATE PO, Take 250 mg by mouth daily., Disp: , Rfl:  .  insulin glargine (LANTUS) 100 UNIT/ML injection, Inject 20 Units into the skin 2 (two) times daily after a meal. , Disp: , Rfl:  .  nitroGLYCERIN (NITROSTAT) 0.4 MG SL tablet, DISSOLVE ONE TABLET UNDER THE TONGUE EVERY 5 MINUTES AS  NEEDED FOR CHEST PAIN., Disp: 25 tablet, Rfl: 3 .  Travoprost, BAK Free, (TRAVATAN) 0.004 % SOLN ophthalmic solution, Place 1 drop into both eyes at bedtime. , Disp: , Rfl:  Allergies  Allergen Reactions  . Other Other (See Comments)    Blood products- Patient won't accept any  . Sulfa Antibiotics Rash  . Uloric [Febuxostat] Rash     Social History   Socioeconomic History  . Marital status: Widowed    Spouse name: Not on file  . Number of children: 3  . Years of education: Not on file  . Highest education level: Some college, no degree  Occupational History  . Occupation: retired  Scientific laboratory technician  . Financial resource strain: Not hard at all  . Food insecurity    Worry: Never true    Inability:  Never true  . Transportation needs    Medical: No    Non-medical: No  Tobacco Use  . Smoking status: Former Smoker    Packs/day: 1.00    Years: 22.00    Pack years: 22.00    Types: Cigarettes    Quit date: 1983    Years since quitting: 37.4  . Smokeless tobacco: Never Used  Substance and Sexual Activity  . Alcohol use: Yes    Comment: 05/22/2018 "glass of wine couple times/wk"  . Drug use: Not Currently  . Sexual activity: Not Currently    Birth control/protection: None, Post-menopausal  Lifestyle  . Physical activity    Days per week: Not on file    Minutes per session: Not on file  . Stress: Not on file  Relationships  . Social Herbalist on phone: Not on file    Gets together: Not on file    Attends religious service: Not on file    Active member of club or organization: Not on file    Attends meetings of clubs or organizations: Not on file    Relationship status: Not on file  . Intimate partner violence    Fear of current or ex partner: Not on file    Emotionally abused: Not on file    Physically abused: Not on file    Forced sexual activity: Not on file  Other Topics Concern  . Not on file  Social History Narrative  . Not on file    Physical Exam      Future Appointments  Date Time Provider [place]  12/02/2018  7:35 AM [facility] DEVICE REMOTES [facility]  12/08/2018 11:50 AM [facility] MM 3 [facility]     BP (!) 106/50 (BP Location: Left Arm, Patient Position: Sitting, Cuff Size: Large)   Pulse 71   Resp 16   Wt 212 lb (96.2 kg)   SpO2 98%   BMI 36.39 kg/m   Weight yesterday-didn't weigh Last visit weight-207  ATF pt CAO x4 sitting at the kitchen table.  She stated that she feels swollen , her weight has increased 5 lbs over the week.  She stated that she hasn't been eating a lot, today she ate barbeque ribs, cornbread and potatoes (left overs from yesterday).  She denies sob, chest pain and dizziness.   She has taken all of her medications for this week.  I will notify the [facility] team about her weight gain.  rx bottles verified and pill box refilled. She's still taking glipizide.   Medication ordered: torosemide Spir filled til tues Metformin Allopurinol  Bowman Higbie, EMT Paramedic [phone] 11/14/2018  ACTION: Home visit completed

## 2018-11-14 DIAGNOSIS — I10 Essential (primary) hypertension: Secondary | ICD-10-CM | POA: Diagnosis not present

## 2018-11-14 DIAGNOSIS — R8290 Unspecified abnormal findings in urine: Secondary | ICD-10-CM | POA: Diagnosis not present

## 2018-11-14 DIAGNOSIS — I480 Paroxysmal atrial fibrillation: Secondary | ICD-10-CM | POA: Diagnosis not present

## 2018-11-14 DIAGNOSIS — N183 Chronic kidney disease, stage 3 (moderate): Secondary | ICD-10-CM | POA: Diagnosis not present

## 2018-11-14 DIAGNOSIS — E1149 Type 2 diabetes mellitus with other diabetic neurological complication: Secondary | ICD-10-CM | POA: Diagnosis not present

## 2018-11-14 DIAGNOSIS — I5022 Chronic systolic (congestive) heart failure: Secondary | ICD-10-CM | POA: Diagnosis not present

## 2018-11-14 DIAGNOSIS — E1122 Type 2 diabetes mellitus with diabetic chronic kidney disease: Secondary | ICD-10-CM | POA: Diagnosis not present

## 2018-11-17 DIAGNOSIS — I10 Essential (primary) hypertension: Secondary | ICD-10-CM | POA: Diagnosis not present

## 2018-11-17 DIAGNOSIS — E1122 Type 2 diabetes mellitus with diabetic chronic kidney disease: Secondary | ICD-10-CM | POA: Diagnosis not present

## 2018-11-17 DIAGNOSIS — N183 Chronic kidney disease, stage 3 (moderate): Secondary | ICD-10-CM | POA: Diagnosis not present

## 2018-11-17 DIAGNOSIS — R8290 Unspecified abnormal findings in urine: Secondary | ICD-10-CM | POA: Diagnosis not present

## 2018-11-17 DIAGNOSIS — I480 Paroxysmal atrial fibrillation: Secondary | ICD-10-CM | POA: Diagnosis not present

## 2018-11-19 ENCOUNTER — Telehealth (HOSPITAL_COMMUNITY): Payer: Self-pay

## 2018-11-19 NOTE — Telephone Encounter (Signed)
Spoke to [place] about appointment for tomorrow confirming for 1500.

## 2018-11-20 ENCOUNTER — Telehealth (HOSPITAL_COMMUNITY): Payer: Self-pay

## 2018-11-20 ENCOUNTER — Other Ambulatory Visit (HOSPITAL_COMMUNITY): Payer: Self-pay

## 2018-11-20 NOTE — Telephone Encounter (Signed)
I called pt to schedule a [facility] visit for today.  We agreed on 12pm

## 2018-11-20 NOTE — Progress Notes (Signed)
Paramedicine Encounter    Patient ID: Danielle [name], female    DOB: Jul 01, 1945, 73 y.o.   MRN: [id number]    Patient Care Team: Danielle Carol, MD as PCP - General (Internal Medicine) Danielle Ny, LCSW as Social Worker (Licensed Clinical Social Worker)  Patient Active Problem [name]   Diagnosis Date Noted  . Encephalopathy acute 05/20/2018  . 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 Acute kidney failure ([place]) 05/15/2018  . Primary localized osteoarthritis of right knee 05/13/2018  . (HFpEF) heart failure with preserved ejection fraction ([place]) 12/20/2017  . Obesity (BMI 30-39.9) 11/11/2017  . OSA (obstructive sleep apnea) 02/18/2017  . Essential hypertension   . Diabetes mellitus without complication ([place])   . Cancer ([place])   . Thyroid disease     Current Outpatient Medications:  .  apixaban (ELIQUIS) 5 MG TABS tablet, Take 1 tablet (5 mg total) by mouth 2 (two) times daily., Disp: 180 tablet, Rfl: 3 .  atorvastatin (LIPITOR) 80 MG tablet, Take 1 tablet (80 mg total) by mouth at bedtime., Disp: 30 tablet, Rfl: 5 .  Biotin 10 MG CAPS, Take 10 mg by mouth daily., Disp: , Rfl:  .  calcium citrate (CALCITRATE - DOSED IN MG ELEMENTAL CALCIUM) 950 MG tablet, Take 1 tablet by mouth 2 (two) times daily. , Disp: , Rfl:  .  carvedilol (COREG) 25 MG tablet, Take 1 tablet (25 mg total) by mouth 2 (two) times daily with a meal., Disp: 60 tablet, Rfl: 3 .  empagliflozin (JARDIANCE) 10 MG TABS tablet, Take 10 mg by mouth daily., Disp: 30 tablet, Rfl: 11 .  ferrous sulfate 325 (65 FE) MG tablet, Take 325 mg by mouth 2 (two) times daily with a meal., Disp: , Rfl:  .  gabapentin (NEURONTIN) 300 MG capsule, Take 300 mg by mouth daily. , Disp: , Rfl:  .  isosorbide mononitrate (IMDUR) 30 MG 24 hr tablet, Take 1 tablet (30 mg total) by mouth daily., Disp: 30 tablet, Rfl: 3 .  levothyroxine (SYNTHROID, LEVOTHROID) 75 MCG tablet, Take 75 mcg by mouth daily before breakfast.,  Disp: , Rfl:  .  losartan (COZAAR) 50 MG tablet, Take 1 tablet (50 mg total) by mouth at bedtime., Disp: 30 tablet, Rfl: 6 .  Multiple Vitamins-Minerals (ALIVE ONCE DAILY WOMENS 50+ PO), Take 1 tablet by mouth daily., Disp: , Rfl:  .  omeprazole (PRILOSEC) 20 MG capsule, Take 20 mg by mouth daily. , Disp: , Rfl:  .  potassium chloride (K-DUR,KLOR-CON) 10 MEQ tablet, Take 2 tablets (20 mEq total) by mouth daily., Disp: 60 tablet, Rfl: 5 .  spironolactone (ALDACTONE) 25 MG tablet, Take 1 tablet (25 mg total) by mouth every other day., Disp: 30 tablet, Rfl: 5 .  torsemide (DEMADEX) 20 MG tablet, Take 1 tablet (20 mg total) by mouth daily., Disp: 30 tablet, Rfl: 3 .  acetaminophen (TYLENOL) 500 MG tablet, Take 500-1,000 mg by mouth every 6 (six) hours as needed (for pain or headaches)., Disp: , Rfl:  .  allopurinol (ZYLOPRIM) 100 MG tablet, Take 1 tablet (100 mg total) by mouth daily., Disp: , Rfl:  .  CHONDROITIN SULFATE PO, Take 250 mg by mouth daily., Disp: , Rfl:  .  insulin glargine (LANTUS) 100 UNIT/ML injection, Inject 20 Units into the skin 2 (two) times daily after a meal. , Disp: , Rfl:  .  nitroGLYCERIN (NITROSTAT) 0.4 MG SL tablet, DISSOLVE ONE TABLET UNDER THE TONGUE EVERY 5 MINUTES AS  NEEDED FOR CHEST PAIN., Disp: 25 tablet, Rfl: 3 .  Travoprost, BAK Free, (TRAVATAN) 0.004 % SOLN ophthalmic solution, Place 1 drop into both eyes at bedtime. , Disp: , Rfl:  Allergies  Allergen Reactions  . Other Other (See Comments)    Blood products- Patient won't accept any  . Sulfa Antibiotics Rash  . Uloric [Febuxostat] Rash     Social History   Socioeconomic History  . Marital status: Widowed    Spouse name: Not on file  . Number of children: 3  . Years of education: Not on file  . Highest education level: Some college, no degree  Occupational History  . Occupation: retired  Scientific laboratory technician  . Financial resource strain: Not hard at all  . Food insecurity    Worry: Never true     Inability: Never true  . Transportation needs    Medical: No    Non-medical: No  Tobacco Use  . Smoking status: Former Smoker    Packs/day: 1.00    Years: 22.00    Pack years: 22.00    Types: Cigarettes    Quit date: 1983    Years since quitting: 37.5  . Smokeless tobacco: Never Used  Substance and Sexual Activity  . Alcohol use: Yes    Comment: 05/22/2018 "glass of wine couple times/wk"  . Drug use: Not Currently  . Sexual activity: Not Currently    Birth control/protection: None, Post-menopausal  Lifestyle  . Physical activity    Days per week: Not on file    Minutes per session: Not on file  . Stress: Not on file  Relationships  . Social Herbalist on phone: Not on file    Gets together: Not on file    Attends religious service: Not on file    Active member of club or organization: Not on file    Attends meetings of clubs or organizations: Not on file    Relationship status: Not on file  . Intimate partner violence    Fear of current or ex partner: Not on file    Emotionally abused: Not on file    Physically abused: Not on file    Forced sexual activity: Not on file  Other Topics Concern  . Not on file  Social History Narrative  . Not on file    Physical Exam Pulmonary:     Effort: Pulmonary effort is normal. No respiratory distress.     Breath sounds: No wheezing or rales.  Abdominal:     Palpations: Abdomen is soft.     Tenderness: There is no abdominal tenderness.  Skin:    General: Skin is warm and dry.         Future Appointments  Date Time Provider [place]  12/02/2018  7:35 AM [facility] DEVICE REMOT[facility]     BP 122/70 (BP Location: Right Arm, Patient Position: Sitting, Cuff Size: Large)   Pulse 69   Temp 98.2 F (36.8 C)   Wt 210 lb (95.3 kg)   SpO2 98%   BMI 36.05 kg/m   Weight yesterday-210 Last visit weight-212  ATF pt CAO x4 sitting at the kitchen table. She said that she had just come  home from Dr. Bernell [name] office.  She stated that her right breast was swollen and tender to touch.  Danielle [name] said that she didn't see Dr. Delfina [name] during this visit. She would like to get a referral for a mamogram.  Pt denies chest pain, sob  and dizziness. She said that she's trying to eat better and we discussed her food choices within the past couple of days.  rx bottles verified and pill box refilled.   I left a message with [name] with Dr. Lina Sar office about the refill metformin and allopurinol  Medication ordered: Allopurinol  Metformin Jardiance Isosorbide losartatin Potassium  Denise Washburn, EMT Paramedic [phone] 11/20/2018    ACTION: Home visit completed

## 2018-11-27 ENCOUNTER — Telehealth (HOSPITAL_COMMUNITY): Payer: Self-pay

## 2018-11-27 ENCOUNTER — Other Ambulatory Visit (HOSPITAL_COMMUNITY): Payer: Self-pay

## 2018-11-27 NOTE — Telephone Encounter (Signed)
I called Mrs. Danielle [name] back to confirm our visit for today and to tell her that i'm running a couple of mins behind.  She said that I can still come by.

## 2018-11-27 NOTE — Progress Notes (Signed)
Paramedicine Encounter    Patient ID: Danielle [name], female    DOB: 1946/04/09, 73 y.o.   MRN: [id number]    Patient Care Team: Seward Carol, MD as PCP - General (Internal Medicine) Jorge Ny, LCSW as Social Worker (Licensed Clinical Social Worker)  Patient Active Problem List   Diagnosis Date Noted  . Encephalopathy acute 05/20/2018  . 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 Acute kidney failure ([place]) 05/15/2018  . Primary localized osteoarthritis of right knee 05/13/2018  . (HFpEF) heart failure with preserved ejection fraction ([name]) 12/20/2017  . Obesity (BMI 30-39.9) 11/11/2017  . OSA (obstructive sleep apnea) 02/18/2017  . Essential hypertension   . Diabetes mellitus without complication ([place])   . Cancer ([place])   . Thyroid disease     Current Outpatient Medications:  .  allopurinol (ZYLOPRIM) 100 MG tablet, Take 1 tablet (100 mg total) by mouth daily., Disp: , Rfl:  .  apixaban (ELIQUIS) 5 MG TABS tablet, Take 1 tablet (5 mg total) by mouth 2 (two) times daily., Disp: 180 tablet, Rfl: 3 .  atorvastatin (LIPITOR) 80 MG tablet, Take 1 tablet (80 mg total) by mouth at bedtime., Disp: 30 tablet, Rfl: 5 .  Biotin 10 MG CAPS, Take 10 mg by mouth daily., Disp: , Rfl:  .  carvedilol (COREG) 25 MG tablet, Take 1 tablet (25 mg total) by mouth 2 (two) times daily with a meal., Disp: 60 tablet, Rfl: 3 .  empagliflozin (JARDIANCE) 10 MG TABS tablet, Take 10 mg by mouth daily., Disp: 30 tablet, Rfl: 11 .  ferrous sulfate 325 (65 FE) MG tablet, Take 325 mg by mouth 2 (two) times daily with a meal., Disp: , Rfl:  .  gabapentin (NEURONTIN) 300 MG capsule, Take 300 mg by mouth daily. , Disp: , Rfl:  .  isosorbide mononitrate (IMDUR) 30 MG 24 hr tablet, Take 1 tablet (30 mg total) by mouth daily., Disp: 30 tablet, Rfl: 3 .  levothyroxine (SYNTHROID, LEVOTHROID) 75 MCG tablet, Take 75 mcg by mouth daily before breakfast., Disp: , Rfl:  .  losartan (COZAAR) 50  MG tablet, Take 1 tablet (50 mg total) by mouth at bedtime., Disp: 30 tablet, Rfl: 6 .  Multiple Vitamins-Minerals (ALIVE ONCE DAILY WOMENS 50+ PO), Take 1 tablet by mouth daily., Disp: , Rfl:  .  omeprazole (PRILOSEC) 20 MG capsule, Take 20 mg by mouth daily. , Disp: , Rfl:  .  potassium chloride (K-DUR,KLOR-CON) 10 MEQ tablet, Take 2 tablets (20 mEq total) by mouth daily., Disp: 60 tablet, Rfl: 5 .  spironolactone (ALDACTONE) 25 MG tablet, Take 1 tablet (25 mg total) by mouth every other day., Disp: 30 tablet, Rfl: 5 .  torsemide (DEMADEX) 20 MG tablet, Take 1 tablet (20 mg total) by mouth daily., Disp: 30 tablet, Rfl: 3 .  acetaminophen (TYLENOL) 500 MG tablet, Take 500-1,000 mg by mouth every 6 (six) hours as needed (for pain or headaches)., Disp: , Rfl:  .  calcium citrate (CALCITRATE - DOSED IN MG ELEMENTAL CALCIUM) 950 MG tablet, Take 1 tablet by mouth 2 (two) times daily. , Disp: , Rfl:  .  CHONDROITIN SULFATE PO, Take 250 mg by mouth daily., Disp: , Rfl:  .  insulin glargine (LANTUS) 100 UNIT/ML injection, Inject 20 Units into the skin 2 (two) times daily after a meal. , Disp: , Rfl:  .  nitroGLYCERIN (NITROSTAT) 0.4 MG SL tablet, DISSOLVE ONE TABLET UNDER THE TONGUE EVERY 5 MINUTES AS  NEEDED FOR CHEST PAIN., Disp: 25 tablet, Rfl: 3 .  Travoprost, BAK Free, (TRAVATAN) 0.004 % SOLN ophthalmic solution, Place 1 drop into both eyes at bedtime. , Disp: , Rfl:  Allergies  Allergen Reactions  . Other Other (See Comments)    Blood products- Patient won't accept any  . Sulfa Antibiotics Rash  . Uloric [Febuxostat] Rash     Social History   Socioeconomic History  . Marital status: Widowed    Spouse name: Not on file  . Number of children: 3  . Years of education: Not on file  . Highest education level: Some college, no degree  Occupational History  . Occupation: retired  Scientific laboratory technician  . Financial resource strain: Not hard at all  . Food insecurity    Worry: Never true    Inability:  Never true  . Transportation needs    Medical: No    Non-medical: No  Tobacco Use  . Smoking status: Former Smoker    Packs/day: 1.00    Years: 22.00    Pack years: 22.00    Types: Cigarettes    Quit date: 1983    Years since quitting: 37.5  . Smokeless tobacco: Never Used  Substance and Sexual Activity  . Alcohol use: Yes    Comment: 05/22/2018 "glass of wine couple times/wk"  . Drug use: Not Currently  . Sexual activity: Not Currently    Birth control/protection: None, Post-menopausal  Lifestyle  . Physical activity    Days per week: Not on file    Minutes per session: Not on file  . Stress: Not on file  Relationships  . Social Herbalist on phone: Not on file    Gets together: Not on file    Attends religious service: Not on file    Active member of club or organization: Not on file    Attends meetings of clubs or organizations: Not on file    Relationship status: Not on file  . Intimate partner violence    Fear of current or ex partner: Not on file    Emotionally abused: Not on file    Physically abused: Not on file    Forced sexual activity: Not on file  Other Topics Concern  . Not on file  Social History Narrative  . Not on file    Physical Exam Musculoskeletal:        General: No swelling.     Right lower leg: No edema.     Left lower leg: No edema.         Future Appointments  Date Time Provider [place]  12/02/2018  7:35 AM [facility] DEVICE REMOTES [facility]     BP (!) 100/54 (BP Location: Right Arm, Patient Position: Sitting, Cuff Size: Normal)   Pulse 74   Temp 98.2 F (36.8 C) (Temporal)   Wt 211 lb (95.7 kg)   SpO2 97%   BMI 36.22 kg/m   Weight yesterday-211 Last visit weight-210  ATF pt CAO x4 sitting in her living room talking on the phone with the pharmacy. She stated that there's a medication that cost $60; she doesn't know which medication it is.  She stated that her right breast is now  itching and aching.  She's still waiting on Dr.Polite to give her the referral to get it looked at.  Pt denies chest pain, dizziness and sob.  She's taken all of her medications for the past week.  She's out of several mediations after  this refill, I left her a reminder and instructions on pill placement.    Dr. Delfina Redwood instructed her not to take metformin during her last visit. Which she had decided to start on her own a while ago.  She acknowledges his instructions.    Medication ordered: Allopurinol atrovastatin Isosorbide losartatin Potassium Calcium Acey Lav Masyn Rostro, EMT Paramedic [phone] 11/27/2018    ACTION: Home visit completed

## 2018-12-02 ENCOUNTER — Ambulatory Visit (INDEPENDENT_AMBULATORY_CARE_PROVIDER_SITE_OTHER): Payer: Medicare HMO | Admitting: *Deleted

## 2018-12-02 DIAGNOSIS — I495 Sick sinus syndrome: Secondary | ICD-10-CM

## 2018-12-03 ENCOUNTER — Telehealth: Payer: Self-pay

## 2018-12-03 DIAGNOSIS — H35033 Hypertensive retinopathy, bilateral: Secondary | ICD-10-CM | POA: Diagnosis not present

## 2018-12-03 DIAGNOSIS — H401112 Primary open-angle glaucoma, right eye, moderate stage: Secondary | ICD-10-CM | POA: Diagnosis not present

## 2018-12-03 DIAGNOSIS — Z961 Presence of intraocular lens: Secondary | ICD-10-CM | POA: Diagnosis not present

## 2018-12-03 DIAGNOSIS — H401123 Primary open-angle glaucoma, left eye, severe stage: Secondary | ICD-10-CM | POA: Diagnosis not present

## 2018-12-03 DIAGNOSIS — E1139 Type 2 diabetes mellitus with other diabetic ophthalmic complication: Secondary | ICD-10-CM | POA: Diagnosis not present

## 2018-12-03 DIAGNOSIS — E113393 Type 2 diabetes mellitus with moderate nonproliferative diabetic retinopathy without macular edema, bilateral: Secondary | ICD-10-CM | POA: Diagnosis not present

## 2018-12-03 DIAGNOSIS — H04123 Dry eye syndrome of bilateral lacrimal glands: Secondary | ICD-10-CM | POA: Diagnosis not present

## 2018-12-03 LAB — CUP PACEART REMOTE DEVICE CHECK
Battery Remaining Longevity: 80 mo
Battery Voltage: 3.01 V
Brady Statistic AP VP Percent: 0.01 %
Brady Statistic AP VS Percent: 9.04 %
Brady Statistic AS VP Percent: 0.04 %
Brady Statistic AS VS Percent: 90.91 %
Brady Statistic RA Percent Paced: 8.98 %
Brady Statistic RV Percent Paced: 0.05 %
Date Time Interrogation Session: 20200708170602
Implantable Lead Implant Date: 20160222
Implantable Lead Implant Date: 20160222
Implantable Lead Location: 753859
Implantable Lead Location: 753860
Implantable Lead Model: 5076
Implantable Lead Model: 5076
Implantable Pulse Generator Implant Date: 20160222
Lead Channel Impedance Value: 399 Ohm
Lead Channel Impedance Value: 399 Ohm
Lead Channel Impedance Value: 418 Ohm
Lead Channel Impedance Value: 475 Ohm
Lead Channel Pacing Threshold Amplitude: 0.75 V
Lead Channel Pacing Threshold Amplitude: 0.75 V
Lead Channel Pacing Threshold Pulse Width: 0.4 ms
Lead Channel Pacing Threshold Pulse Width: 0.4 ms
Lead Channel Sensing Intrinsic Amplitude: 17.75 mV
Lead Channel Sensing Intrinsic Amplitude: 17.75 mV
Lead Channel Sensing Intrinsic Amplitude: 4.125 mV
Lead Channel Sensing Intrinsic Amplitude: 4.125 mV
Lead Channel Setting Pacing Amplitude: 2 V
Lead Channel Setting Pacing Amplitude: 2 V
Lead Channel Setting Pacing Pulse Width: 0.4 ms
Lead Channel Setting Sensing Sensitivity: 2 mV

## 2018-12-03 NOTE — Telephone Encounter (Signed)
Spoke with patient to remind of missed remote transmission 

## 2018-12-04 ENCOUNTER — Other Ambulatory Visit (HOSPITAL_COMMUNITY): Payer: Self-pay

## 2018-12-04 NOTE — Progress Notes (Signed)
Paramedicine Encounter    Patient ID: Danielle [name], female    DOB: 1945-09-18, 73 y.o.   MRN: [id number]    Patient Care Team: Seward Carol, MD as PCP - General (Internal Medicine) Jorge Ny, LCSW as Social Worker (Licensed Clinical Social Worker)  Patient Active Problem List   Diagnosis Date Noted  . Encephalopathy acute 05/20/2018  . 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 Acute kidney failure ([place]) 05/15/2018  . Primary localized osteoarthritis of right knee 05/13/2018  . (HFpEF) heart failure with preserved ejection fraction ([place]) 12/20/2017  . Obesity (BMI 30-39.9) 11/11/2017  . OSA (obstructive sleep apnea) 02/18/2017  . Essential hypertension   . Diabetes mellitus without complication ([place])   . Cancer ([place])   . Thyroid disease     Current Outpatient Medications:  .  apixaban (ELIQUIS) 5 MG TABS tablet, Take 1 tablet (5 mg total) by mouth 2 (two) times daily., Disp: 180 tablet, Rfl: 3 .  atorvastatin (LIPITOR) 80 MG tablet, Take 1 tablet (80 mg total) by mouth at bedtime., Disp: 30 tablet, Rfl: 5 .  calcium citrate (CALCITRATE - DOSED IN MG ELEMENTAL CALCIUM) 950 MG tablet, Take 1 tablet by mouth 2 (two) times daily. , Disp: , Rfl:  .  carvedilol (COREG) 25 MG tablet, Take 1 tablet (25 mg total) by mouth 2 (two) times daily with a meal., Disp: 60 tablet, Rfl: 3 .  empagliflozin (JARDIANCE) 10 MG TABS tablet, Take 10 mg by mouth daily., Disp: 30 tablet, Rfl: 11 .  ferrous sulfate 325 (65 FE) MG tablet, Take 325 mg by mouth 2 (two) times daily with a meal., Disp: , Rfl:  .  gabapentin (NEURONTIN) 300 MG capsule, Take 300 mg by mouth daily. , Disp: , Rfl:  .  isosorbide mononitrate (IMDUR) 30 MG 24 hr tablet, Take 1 tablet (30 mg total) by mouth daily., Disp: 30 tablet, Rfl: 3 .  levothyroxine (SYNTHROID, LEVOTHROID) 75 MCG tablet, Take 75 mcg by mouth daily before breakfast., Disp: , Rfl:  .  losartan (COZAAR) 50 MG tablet, Take 1 tablet (50  mg total) by mouth at bedtime., Disp: 30 tablet, Rfl: 6 .  Multiple Vitamins-Minerals (ALIVE ONCE DAILY WOMENS 50+ PO), Take 1 tablet by mouth daily., Disp: , Rfl:  .  omeprazole (PRILOSEC) 20 MG capsule, Take 20 mg by mouth daily. , Disp: , Rfl:  .  potassium chloride (K-DUR,KLOR-CON) 10 MEQ tablet, Take 2 tablets (20 mEq total) by mouth daily., Disp: 60 tablet, Rfl: 5 .  spironolactone (ALDACTONE) 25 MG tablet, Take 1 tablet (25 mg total) by mouth every other day., Disp: 30 tablet, Rfl: 5 .  torsemide (DEMADEX) 20 MG tablet, Take 1 tablet (20 mg total) by mouth daily., Disp: 30 tablet, Rfl: 3 .  acetaminophen (TYLENOL) 500 MG tablet, Take 500-1,000 mg by mouth every 6 (six) hours as needed (for pain or headaches)., Disp: , Rfl:  .  allopurinol (ZYLOPRIM) 100 MG tablet, Take 1 tablet (100 mg total) by mouth daily., Disp: , Rfl:  .  Biotin 10 MG CAPS, Take 10 mg by mouth daily., Disp: , Rfl:  .  CHONDROITIN SULFATE PO, Take 250 mg by mouth daily., Disp: , Rfl:  .  insulin glargine (LANTUS) 100 UNIT/ML injection, Inject 20 Units into the skin 2 (two) times daily after a meal. , Disp: , Rfl:  .  nitroGLYCERIN (NITROSTAT) 0.4 MG SL tablet, DISSOLVE ONE TABLET UNDER THE TONGUE EVERY 5 MINUTES AS  NEEDED FOR CHEST PAIN., Disp: 25 tablet, Rfl: 3 .  Travoprost, BAK Free, (TRAVATAN) 0.004 % SOLN ophthalmic solution, Place 1 drop into both eyes at bedtime. , Disp: , Rfl:  Allergies  Allergen Reactions  . Other Other (See Comments)    Blood products- Patient won't accept any  . Sulfa Antibiotics Rash  . Uloric [Febuxostat] Rash     Social History   Socioeconomic History  . Marital status: Widowed    Spouse name: Not on file  . Number of children: 3  . Years of education: Not on file  . Highest education level: Some college, no degree  Occupational History  . Occupation: retired  Scientific laboratory technician  . Financial resource strain: Not hard at all  . Food insecurity    Worry: Never true    Inability:  Never true  . Transportation needs    Medical: No    Non-medical: No  Tobacco Use  . Smoking status: Former Smoker    Packs/day: 1.00    Years: 22.00    Pack years: 22.00    Types: Cigarettes    Quit date: 1983    Years since quitting: 37.5  . Smokeless tobacco: Never Used  Substance and Sexual Activity  . Alcohol use: Yes    Comment: 05/22/2018 "glass of wine couple times/wk"  . Drug use: Not Currently  . Sexual activity: Not Currently    Birth control/protection: None, Post-menopausal  Lifestyle  . Physical activity    Days per week: Not on file    Minutes per session: Not on file  . Stress: Not on file  Relationships  . Social Herbalist on phone: Not on file    Gets together: Not on file    Attends religious service: Not on file    Active member of club or organization: Not on file    Attends meetings of clubs or organizations: Not on file    Relationship status: Not on file  . Intimate partner violence    Fear of current or ex partner: Not on file    Emotionally abused: Not on file    Physically abused: Not on file    Forced sexual activity: Not on file  Other Topics Concern  . Not on file  Social History Narrative  . Not on file    Physical Exam      No future appointments.   BP 98/60 (BP Location: Right Arm)   Pulse 61   Temp 98.6 F (37 C)   Wt 207 lb (93.9 kg)   SpO2 98%   BMI 35.53 kg/m   Weight yesterday-211 Last visit weight-211  ATF pt CAO x4.  She's c/o aching I her knees, which she said is arthritis pain. She has no other complaints.  She has taken all of her medications for this week.  Danielle [name] was away this weekend with her brother and she admits to eating fried foods rich in sodium.There's a few medications missing from her pill container.  She said that her daughter will pick them up later today.  I left pill placement instructions on a sticky pad.  She denies sob, chest pain and dizziness; no edema noted to.  rx bottles  verified and pill box refilled.    Medication ordered: atorvastatin  Ferrous  Calcium  Danielle [name], EMT Paramedic [phone] 12/04/2018    ACTION: Home visit completed

## 2018-12-08 ENCOUNTER — Ambulatory Visit: Payer: Medicare HMO

## 2018-12-11 ENCOUNTER — Other Ambulatory Visit (HOSPITAL_COMMUNITY): Payer: Self-pay

## 2018-12-11 NOTE — Progress Notes (Signed)
Paramedicine Encounter    Patient ID: Danielle [name], female    DOB: 1945/07/20, 73 y.o.   MRN: [id number]    Patient Care Team: Seward Carol, MD as PCP - General (Internal Medicine) Jorge Ny, LCSW as Social Worker (Licensed Clinical Social Worker)  Patient Active Problem List   Diagnosis Date Noted  . Encephalopathy acute 05/20/2018  . 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 Acute kidney failure ([place]) 05/15/2018  . Primary localized osteoarthritis of right knee 05/13/2018  . (HFpEF) heart failure with preserved ejection fraction ([place]) 12/20/2017  . Obesity (BMI 30-39.9) 11/11/2017  . OSA (obstructive sleep apnea) 02/18/2017  . Essential hypertension   . Diabetes mellitus without complication ([place])   . Cancer ([place])   . Thyroid disease     Current Outpatient Medications:  .  apixaban (ELIQUIS) 5 MG TABS tablet, Take 1 tablet (5 mg total) by mouth 2 (two) times daily., Disp: 180 tablet, Rfl: 3 .  calcium citrate (CALCITRATE - DOSED IN MG ELEMENTAL CALCIUM) 950 MG tablet, Take 1 tablet by mouth 2 (two) times daily. , Disp: , Rfl:  .  carvedilol (COREG) 25 MG tablet, Take 1 tablet (25 mg total) by mouth 2 (two) times daily with a meal., Disp: 60 tablet, Rfl: 3 .  empagliflozin (JARDIANCE) 10 MG TABS tablet, Take 10 mg by mouth daily., Disp: 30 tablet, Rfl: 11 .  gabapentin (NEURONTIN) 300 MG capsule, Take 300 mg by mouth daily. , Disp: , Rfl:  .  isosorbide mononitrate (IMDUR) 30 MG 24 hr tablet, Take 1 tablet (30 mg total) by mouth daily., Disp: 30 tablet, Rfl: 3 .  levothyroxine (SYNTHROID, LEVOTHROID) 75 MCG tablet, Take 75 mcg by mouth daily before breakfast., Disp: , Rfl:  .  losartan (COZAAR) 50 MG tablet, Take 1 tablet (50 mg total) by mouth at bedtime., Disp: 30 tablet, Rfl: 6 .  Multiple Vitamins-Minerals (ALIVE ONCE DAILY WOMENS 50+ PO), Take 1 tablet by mouth daily., Disp: , Rfl:  .  omeprazole (PRILOSEC) 20 MG capsule, Take 20 mg by mouth  daily. , Disp: , Rfl:  .  potassium chloride (K-DUR,KLOR-CON) 10 MEQ tablet, Take 2 tablets (20 mEq total) by mouth daily., Disp: 60 tablet, Rfl: 5 .  spironolactone (ALDACTONE) 25 MG tablet, Take 1 tablet (25 mg total) by mouth every other day., Disp: 30 tablet, Rfl: 5 .  torsemide (DEMADEX) 20 MG tablet, Take 1 tablet (20 mg total) by mouth daily., Disp: 30 tablet, Rfl: 3 .  acetaminophen (TYLENOL) 500 MG tablet, Take 500-1,000 mg by mouth every 6 (six) hours as needed (for pain or headaches)., Disp: , Rfl:  .  allopurinol (ZYLOPRIM) 100 MG tablet, Take 1 tablet (100 mg total) by mouth daily., Disp: , Rfl:  .  atorvastatin (LIPITOR) 80 MG tablet, Take 1 tablet (80 mg total) by mouth at bedtime., Disp: 30 tablet, Rfl: 5 .  Biotin 10 MG CAPS, Take 10 mg by mouth daily., Disp: , Rfl:  .  CHONDROITIN SULFATE PO, Take 250 mg by mouth daily., Disp: , Rfl:  .  ferrous sulfate 325 (65 FE) MG tablet, Take 325 mg by mouth 2 (two) times daily with a meal., Disp: , Rfl:  .  insulin glargine (LANTUS) 100 UNIT/ML injection, Inject 20 Units into the skin 2 (two) times daily after a meal. , Disp: , Rfl:  .  nitroGLYCERIN (NITROSTAT) 0.4 MG SL tablet, DISSOLVE ONE TABLET UNDER THE TONGUE EVERY 5 MINUTES AS  NEEDED FOR CHEST PAIN., Disp: 25 tablet, Rfl: 3 .  Travoprost, BAK Free, (TRAVATAN) 0.004 % SOLN ophthalmic solution, Place 1 drop into both eyes at bedtime. , Disp: , Rfl:  Allergies  Allergen Reactions  . Other Other (See Comments)    Blood products- Patient won't accept any  . Sulfa Antibiotics Rash  . Uloric [Febuxostat] Rash     Social History   Socioeconomic History  . Marital status: Widowed    Spouse name: Not on file  . Number of children: 3  . Years of education: Not on file  . Highest education level: Some college, no degree  Occupational History  . Occupation: retired  Scientific laboratory technician  . Financial resource strain: Not hard at all  . Food insecurity    Worry: Never true    Inability:  Never true  . Transportation needs    Medical: No    Non-medical: No  Tobacco Use  . Smoking status: Former Smoker    Packs/day: 1.00    Years: 22.00    Pack years: 22.00    Types: Cigarettes    Quit date: 1983    Years since quitting: 37.5  . Smokeless tobacco: Never Used  Substance and Sexual Activity  . Alcohol use: Yes    Comment: 05/22/2018 "glass of wine couple times/wk"  . Drug use: Not Currently  . Sexual activity: Not Currently    Birth control/protection: None, Post-menopausal  Lifestyle  . Physical activity    Days per week: Not on file    Minutes per session: Not on file  . Stress: Not on file  Relationships  . Social Herbalist on phone: Not on file    Gets together: Not on file    Attends religious service: Not on file    Active member of club or organization: Not on file    Attends meetings of clubs or organizations: Not on file    Relationship status: Not on file  . Intimate partner violence    Fear of current or ex partner: Not on file    Emotionally abused: Not on file    Physically abused: Not on file    Forced sexual activity: Not on file  Other Topics Concern  . Not on file  Social History Narrative  . Not on file    Physical Exam Pulmonary:     Effort: Pulmonary effort is normal. No respiratory distress.  Musculoskeletal:        General: No swelling.  Skin:    General: Skin is warm and dry.         No future appointments.   There were no vitals taken for this visit. cbg 543 pt done 30 units about 1 hour ago  Weight yesterday-210.6 Last visit weight-207  ATF pt CAO x4 c/o "being stiff". She has no other complaints today. Her daughter just left from getting her dinner for the night. Mrs. Tidmore has taken all of her medications for the week.  She denies sob, chest pain and dizziness.  Pt's CBG is elevated @ 543; she took 30 units of insulin about a hr prior to my arrival.  She was told during her last appointment to stop  taking metformin which she had restarted on her own.  Rx bottles verified and pill box refilled.   I spoke with [name] at Dr. Bernell List office. She said that she will get the message to Dr. Delfina Redwood that Mrs. Michie blood sugar is elevated and will  call her back.  I told pt and her daughter to expect a call back from [place] or someone from Dr. Lina Sar office.     Medication ordered: I called [facility]--- still waiting on dr [name] Metformin-- atorvastan  Ferrous sulfate Insulin (pt stated that she will call it in) something about dr changing the amount Gabapentin Spirolactone Omeprazole  Sonya Gunnoe, EMT Paramedic [phone] 12/11/2018    ACTION: Home visit completed

## 2018-12-13 ENCOUNTER — Encounter: Payer: Self-pay | Admitting: Cardiology

## 2018-12-13 NOTE — Progress Notes (Signed)
Remote pacemaker transmission.   

## 2018-12-22 ENCOUNTER — Telehealth (HOSPITAL_COMMUNITY): Payer: Self-pay

## 2018-12-22 DIAGNOSIS — N949 Unspecified condition associated with female genital organs and menstrual cycle: Secondary | ICD-10-CM | POA: Diagnosis not present

## 2018-12-22 DIAGNOSIS — E1140 Type 2 diabetes mellitus with diabetic neuropathy, unspecified: Secondary | ICD-10-CM | POA: Diagnosis not present

## 2018-12-22 NOTE — Telephone Encounter (Signed)
Spoke to Danielle [name] who reports she is feeling upset because her sugar has been running high over the last two weeks and she self adjusted her insulin and she has not seen any improvements. She reports that she has a telephone visit today with her doctor. She agreed to home visit tomorrow.

## 2018-12-23 ENCOUNTER — Other Ambulatory Visit (HOSPITAL_COMMUNITY): Payer: Self-pay

## 2018-12-23 DIAGNOSIS — K219 Gastro-esophageal reflux disease without esophagitis: Secondary | ICD-10-CM | POA: Diagnosis not present

## 2018-12-23 DIAGNOSIS — I509 Heart failure, unspecified: Secondary | ICD-10-CM | POA: Diagnosis not present

## 2018-12-23 DIAGNOSIS — E039 Hypothyroidism, unspecified: Secondary | ICD-10-CM | POA: Diagnosis not present

## 2018-12-23 DIAGNOSIS — I252 Old myocardial infarction: Secondary | ICD-10-CM | POA: Diagnosis not present

## 2018-12-23 DIAGNOSIS — E1142 Type 2 diabetes mellitus with diabetic polyneuropathy: Secondary | ICD-10-CM | POA: Diagnosis not present

## 2018-12-23 DIAGNOSIS — I25119 Atherosclerotic heart disease of native coronary artery with unspecified angina pectoris: Secondary | ICD-10-CM | POA: Diagnosis not present

## 2018-12-23 DIAGNOSIS — Z794 Long term (current) use of insulin: Secondary | ICD-10-CM | POA: Diagnosis not present

## 2018-12-23 DIAGNOSIS — I110 Hypertensive heart disease with heart failure: Secondary | ICD-10-CM | POA: Diagnosis not present

## 2018-12-23 DIAGNOSIS — E785 Hyperlipidemia, unspecified: Secondary | ICD-10-CM | POA: Diagnosis not present

## 2018-12-23 NOTE — Progress Notes (Signed)
Paramedicine Encounter    Patient ID: Danielle [name], female    DOB: Feb 23, 1946, 73 y.o.   MRN: [id number]   Patient Care Team: Seward Carol, MD as PCP - General (Internal Medicine) Jorge Ny, LCSW as Social Worker (Licensed Clinical Social Worker)  Patient Active Problem List   Diagnosis Date Noted  . Encephalopathy acute 05/20/2018  . 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 Acute kidney failure ([place]) 05/15/2018  . Primary localized osteoarthritis of right knee 05/13/2018  . (HFpEF) heart failure with preserved ejection fraction ([place]) 12/20/2017  . Obesity (BMI 30-39.9) 11/11/2017  . OSA (obstructive sleep apnea) 02/18/2017  . Essential hypertension   . Diabetes mellitus without complication ([place])   . Cancer ([place])   . Thyroid disease     Current Outpatient Medications:  .  acetaminophen (TYLENOL) 500 MG tablet, Take 500-1,000 mg by mouth every 6 (six) hours as needed (for pain or headaches)., Disp: , Rfl:  .  apixaban (ELIQUIS) 5 MG TABS tablet, Take 1 tablet (5 mg total) by mouth 2 (two) times daily., Disp: 180 tablet, Rfl: 3 .  atorvastatin (LIPITOR) 80 MG tablet, Take 1 tablet (80 mg total) by mouth at bedtime., Disp: 30 tablet, Rfl: 5 .  Biotin 10 MG CAPS, Take 10 mg by mouth daily., Disp: , Rfl:  .  calcium citrate (CALCITRATE - DOSED IN MG ELEMENTAL CALCIUM) 950 MG tablet, Take 1 tablet by mouth 2 (two) times daily. , Disp: , Rfl:  .  carvedilol (COREG) 25 MG tablet, Take 1 tablet (25 mg total) by mouth 2 (two) times daily with a meal., Disp: 60 tablet, Rfl: 3 .  empagliflozin (JARDIANCE) 10 MG TABS tablet, Take 10 mg by mouth daily., Disp: 30 tablet, Rfl: 11 .  ferrous sulfate 325 (65 FE) MG tablet, Take 325 mg by mouth 2 (two) times daily with a meal., Disp: , Rfl:  .  gabapentin (NEURONTIN) 300 MG capsule, Take 300 mg by mouth daily. , Disp: , Rfl:  .  insulin glargine (LANTUS) 100 UNIT/ML injection, Inject 20 Units into the skin 2 (two)  times daily after a meal. , Disp: , Rfl:  .  isosorbide mononitrate (IMDUR) 30 MG 24 hr tablet, Take 1 tablet (30 mg total) by mouth daily., Disp: 30 tablet, Rfl: 3 .  levothyroxine (SYNTHROID, LEVOTHROID) 75 MCG tablet, Take 75 mcg by mouth daily before breakfast., Disp: , Rfl:  .  losartan (COZAAR) 50 MG tablet, Take 1 tablet (50 mg total) by mouth at bedtime., Disp: 30 tablet, Rfl: 6 .  Multiple Vitamins-Minerals (ALIVE ONCE DAILY WOMENS 50+ PO), Take 1 tablet by mouth daily., Disp: , Rfl:  .  omeprazole (PRILOSEC) 20 MG capsule, Take 20 mg by mouth daily. , Disp: , Rfl:  .  potassium chloride (K-DUR,KLOR-CON) 10 MEQ tablet, Take 2 tablets (20 mEq total) by mouth daily., Disp: 60 tablet, Rfl: 5 .  torsemide (DEMADEX) 20 MG tablet, Take 1 tablet (20 mg total) by mouth daily., Disp: 30 tablet, Rfl: 3 .  Travoprost, BAK Free, (TRAVATAN) 0.004 % SOLN ophthalmic solution, Place 1 drop into both eyes at bedtime. , Disp: , Rfl:  .  allopurinol (ZYLOPRIM) 100 MG tablet, Take 1 tablet (100 mg total) by mouth daily. (Patient not taking: Reported on 12/23/2018), Disp: , Rfl:  .  CHONDROITIN SULFATE PO, Take 250 mg by mouth daily., Disp: , Rfl:  .  nitroGLYCERIN (NITROSTAT) 0.4 MG SL tablet, DISSOLVE ONE TABLET UNDER  THE TONGUE EVERY 5 MINUTES AS NEEDED FOR CHEST PAIN., Disp: 25 tablet, Rfl: 3 .  spironolactone (ALDACTONE) 25 MG tablet, Take 1 tablet (25 mg total) by mouth every other day. (Patient not taking: Reported on 12/23/2018), Disp: 30 tablet, Rfl: 5 Allergies  Allergen Reactions  . Other Other (See Comments)    Blood products- Patient won't accept any  . Sulfa Antibiotics Rash  . Uloric [Febuxostat] Rash     Social History   Socioeconomic History  . Marital status: Widowed    Spouse name: Not on file  . Number of children: 3  . Years of education: Not on file  . Highest education level: Some college, no degree  Occupational History  . Occupation: retired  Scientific laboratory technician  . Financial  resource strain: Not hard at all  . Food insecurity    Worry: Never true    Inability: Never true  . Transportation needs    Medical: No    Non-medical: No  Tobacco Use  . Smoking status: Former Smoker    Packs/day: 1.00    Years: 22.00    Pack years: 22.00    Types: Cigarettes    Quit date: 1983    Years since quitting: 37.5  . Smokeless tobacco: Never Used  Substance and Sexual Activity  . Alcohol use: Yes    Comment: 05/22/2018 "glass of wine couple times/wk"  . Drug use: Not Currently  . Sexual activity: Not Currently    Birth control/protection: None, Post-menopausal  Lifestyle  . Physical activity    Days per week: Not on file    Minutes per session: Not on file  . Stress: Not on file  Relationships  . Social Herbalist on phone: Not on file    Gets together: Not on file    Attends religious service: Not on file    Active member of club or organization: Not on file    Attends meetings of clubs or organizations: Not on file    Relationship status: Not on file  . Intimate partner violence    Fear of current or ex partner: Not on file    Emotionally abused: Not on file    Physically abused: Not on file    Forced sexual activity: Not on file  Other Topics Concern  . Not on file  Social History Narrative  . Not on file    Physical Exam Vitals signs reviewed.  Constitutional:      Appearance: She is normal weight.  HENT:     Head: Normocephalic.     Ears:     Comments: Ear pain and itching.     Nose: Nose normal.  Neck:     Musculoskeletal: Normal range of motion.  Cardiovascular:     Rate and Rhythm: Normal rate and regular rhythm.     Pulses: Normal pulses.  Pulmonary:     Effort: Pulmonary effort is normal.     Breath sounds: Normal breath sounds.  Abdominal:     Palpations: Abdomen is soft.  Musculoskeletal: Normal range of motion.     Right lower leg: Edema present.     Left lower leg: Edema present.  Skin:    General: Skin is warm.      Capillary Refill: Capillary refill takes less than 2 seconds.  Neurological:     Mental Status: She is alert. Mental status is at baseline.  Psychiatric:        Mood and Affect: Mood normal.  Arrived for home visit for [facility] who was ambulating in the home upon my arrival. Atheena stated she has been having some issues with burning, itching and pain in both of her ears causing her balance to be off. She stated she spoke to her PCP about same and he was supposed to have called her back with an appointment but has not done so yet. No other neurological deficits noted on exam. Patient stated she thinks her sinuses have caused an ear infection which has been causing her sugar to be high. PCP advised patient yesterday having sugars >300 to increase her insulin doses. Patient did so and today her sugar has been less than 200. Vitals were obtained and are as recorded. Medications were reviewed and verified. 2 pill boxes were filled for Tylene. Yianna is to start on pill box #2 today and follow up with #1 next week. Patient understood same. Patient also understood pill box #1 will be missing some pills and understands how to place them in the box once she gets them from the pharmacy with written instructions. I contacted charge RN at Dr. Bernell List office in regards to patient's complaints and RN stated she would call patient directly with instructions from Dr. Delfina Redwood about ear complaints. Home visit complete. Patient agreed to see [facility] after her return to work.  Pill box #1 Needs: Biotin (all doses) Losartan (Sat PM) Potassium (Mon-Sat AM)    Medications Ordered: Losartan Isosorbide Levothyroxine Potassium  Ordered but staff stated too soon: Spiro Omeprazole Gabapentin   Weight- 209lbs CBG- 89 (when she woke up)  248 (at 1245)     Future Appointments  Date Time Provider [place]  03/03/2019  7:20 AM [facility] DEVICE REMOTES [facility]  06/02/2019  7:20 AM  [facility] DEVICE REMOTES [facility]  09/01/2019  7:20 AM [facility] DEVICE REMOTES [facility]  12/01/2019  7:20 AM [facility] DEVICE REMOTES [facility]  03/01/2020  7:20 AM [facility] DEVICE REMOTES [facility]     ACTION: Home visit completed Next visit planned for upon Dee'e return to work

## 2018-12-29 DIAGNOSIS — Z7984 Long term (current) use of oral hypoglycemic drugs: Secondary | ICD-10-CM | POA: Diagnosis not present

## 2018-12-29 DIAGNOSIS — R350 Frequency of micturition: Secondary | ICD-10-CM | POA: Diagnosis not present

## 2018-12-29 DIAGNOSIS — M109 Gout, unspecified: Secondary | ICD-10-CM | POA: Diagnosis not present

## 2018-12-29 DIAGNOSIS — E1140 Type 2 diabetes mellitus with diabetic neuropathy, unspecified: Secondary | ICD-10-CM | POA: Diagnosis not present

## 2019-01-01 ENCOUNTER — Other Ambulatory Visit (HOSPITAL_COMMUNITY): Payer: Self-pay

## 2019-01-01 NOTE — Progress Notes (Signed)
Paramedicine Encounter    Patient ID: Danielle [name], female    DOB: [birth year], 73 y.o.   MRN: [id number]    Patient Care Team: Seward Carol, MD as PCP - General (Internal Medicine) Jorge Ny, LCSW as Social Worker (Licensed Clinical Social Worker)  Patient Active Problem List   Diagnosis Date Noted  . Encephalopathy acute 05/20/2018  . 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 Acute kidney failure ([place]) 05/15/2018  . Primary localized osteoarthritis of right knee 05/13/2018  . (HFpEF) heart failure with preserved ejection fraction ([place]) 12/20/2017  . Obesity (BMI 30-39.9) 11/11/2017  . OSA (obstructive sleep apnea) 02/18/2017  . Essential hypertension   . Diabetes mellitus without complication ([place])   . Cancer ([place])   . Thyroid disease     Current Outpatient Medications:  .  apixaban (ELIQUIS) 5 MG TABS tablet, Take 1 tablet (5 mg total) by mouth 2 (two) times daily., Disp: 180 tablet, Rfl: 3 .  atorvastatin (LIPITOR) 80 MG tablet, Take 1 tablet (80 mg total) by mouth at bedtime., Disp: 30 tablet, Rfl: 5 .  calcium citrate (CALCITRATE - DOSED IN MG ELEMENTAL CALCIUM) 950 MG tablet, Take 1 tablet by mouth 2 (two) times daily. , Disp: , Rfl:  .  carvedilol (COREG) 25 MG tablet, Take 1 tablet (25 mg total) by mouth 2 (two) times daily with a meal., Disp: 60 tablet, Rfl: 3 .  empagliflozin (JARDIANCE) 10 MG TABS tablet, Take 10 mg by mouth daily., Disp: 30 tablet, Rfl: 11 .  ferrous sulfate 325 (65 FE) MG tablet, Take 325 mg by mouth 2 (two) times daily with a meal., Disp: , Rfl:  .  gabapentin (NEURONTIN) 300 MG capsule, Take 300 mg by mouth daily. , Disp: , Rfl:  .  isosorbide mononitrate (IMDUR) 30 MG 24 hr tablet, Take 1 tablet (30 mg total) by mouth daily., Disp: 30 tablet, Rfl: 3 .  levothyroxine (SYNTHROID, LEVOTHROID) 75 MCG tablet, Take 75 mcg by mouth daily before breakfast., Disp: , Rfl:  .  losartan (COZAAR) 50 MG tablet, Take 1 tablet (50  mg total) by mouth at bedtime., Disp: 30 tablet, Rfl: 6 .  omeprazole (PRILOSEC) 20 MG capsule, Take 20 mg by mouth daily. , Disp: , Rfl:  .  potassium chloride (K-DUR,KLOR-CON) 10 MEQ tablet, Take 2 tablets (20 mEq total) by mouth daily., Disp: 60 tablet, Rfl: 5 .  spironolactone (ALDACTONE) 25 MG tablet, Take 1 tablet (25 mg total) by mouth every other day., Disp: 30 tablet, Rfl: 5 .  torsemide (DEMADEX) 20 MG tablet, Take 1 tablet (20 mg total) by mouth daily., Disp: 30 tablet, Rfl: 3 .  acetaminophen (TYLENOL) 500 MG tablet, Take 500-1,000 mg by mouth every 6 (six) hours as needed (for pain or headaches)., Disp: , Rfl:  .  allopurinol (ZYLOPRIM) 100 MG tablet, Take 1 tablet (100 mg total) by mouth daily. (Patient not taking: Reported on 12/23/2018), Disp: , Rfl:  .  Biotin 10 MG CAPS, Take 10 mg by mouth daily., Disp: , Rfl:  .  CHONDROITIN SULFATE PO, Take 250 mg by mouth daily., Disp: , Rfl:  .  insulin glargine (LANTUS) 100 UNIT/ML injection, Inject 20 Units into the skin 2 (two) times daily after a meal. , Disp: , Rfl:  .  Multiple Vitamins-Minerals (ALIVE ONCE DAILY WOMENS 50+ PO), Take 1 tablet by mouth daily., Disp: , Rfl:  .  nitroGLYCERIN (NITROSTAT) 0.4 MG SL tablet, DISSOLVE ONE TABLET UNDER  THE TONGUE EVERY 5 MINUTES AS NEEDED FOR CHEST PAIN., Disp: 25 tablet, Rfl: 3 .  Travoprost, BAK Free, (TRAVATAN) 0.004 % SOLN ophthalmic solution, Place 1 drop into both eyes at bedtime. , Disp: , Rfl:  Allergies  Allergen Reactions  . Other Other (See Comments)    Blood products- Patient won't accept any  . Sulfa Antibiotics Rash  . Uloric [Febuxostat] Rash     Social History   Socioeconomic History  . Marital status: Widowed    Spouse name: Not on file  . Number of children: 3  . Years of education: Not on file  . Highest education level: Some college, no degree  Occupational History  . Occupation: retired  Scientific laboratory technician  . Financial resource strain: Not hard at all  . Food  insecurity    Worry: Never true    Inability: Never true  . Transportation needs    Medical: No    Non-medical: No  Tobacco Use  . Smoking status: Former Smoker    Packs/day: 1.00    Years: 22.00    Pack years: 22.00    Types: Cigarettes    Quit date: 1983    Years since quitting: 37.6  . Smokeless tobacco: Never Used  Substance and Sexual Activity  . Alcohol use: Yes    Comment: 05/22/2018 "glass of wine couple times/wk"  . Drug use: Not Currently  . Sexual activity: Not Currently    Birth control/protection: None, Post-menopausal  Lifestyle  . Physical activity    Days per week: Not on file    Minutes per session: Not on file  . Stress: Not on file  Relationships  . Social Herbalist on phone: Not on file    Gets together: Not on file    Attends religious service: Not on file    Active member of club or organization: Not on file    Attends meetings of clubs or organizations: Not on file    Relationship status: Not on file  . Intimate partner violence    Fear of current or ex partner: Not on file    Emotionally abused: Not on file    Physically abused: Not on file    Forced sexual activity: Not on file  Other Topics Concern  . Not on file  Social History Narrative  . Not on file    Physical Exam Pulmonary:     Effort: Pulmonary effort is normal. No respiratory distress.     Breath sounds: No wheezing or rales.  Musculoskeletal:        General: No swelling.  Skin:    General: Skin is warm and dry.         Future Appointments  Date Time Provider Center [place]  02/09/2019 12:00 PM [facility]  03/03/2019  7:20 AM [facility] DEVICE REMOTES [facility]  06/02/2019  7:20 AM [facility] DEVICE REMOTES [facility]  09/01/2019  7:20 AM [facility] DEVICE [facility]OTES [facility]  12/01/2019  7:20 AM [facility] DEVICE REMOTES [facility]  03/01/2020  7:20 AM [facility] DEVICE REMOTES  [facility]     BP 106/64 (BP Location: Right Arm, Patient Position: Sitting, Cuff Size: Normal)   Pulse 76   Temp 97.8 F (36.6 C)   Resp 15   Wt 210 lb (95.3 kg)   SpO2 97%   BMI 36.05 kg/m  cbg 159 Weight yesterday-209 Last visit weight-210  ATF pt CAO to her norm sitting in the living  room watching tv.  She had concerns about her pill which I explained to her during this visit.  She's taken her medications for the past week but she took medications that she had questions about out of the box.  Pt denies sob chest pain and dizziness.  She has pressure stockings on because she think that her legs has swollen, (No change that I can see).  No bleeding, no trouble with her appetite or bowel movements.  rx bottles verified and pill box refilled.    Medication ordered:  Lynford Espinoza, EMT Paramedic [phone] 01/01/2019    ACTION: Home visit completed

## 2019-01-02 DIAGNOSIS — G4733 Obstructive sleep apnea (adult) (pediatric): Secondary | ICD-10-CM | POA: Diagnosis not present

## 2019-01-05 ENCOUNTER — Telehealth (HOSPITAL_COMMUNITY): Payer: Self-pay | Admitting: Licensed Clinical Social Worker

## 2019-01-05 NOTE — Telephone Encounter (Signed)
CSW reached out to pt to check in regarding food and medication status at this time. Pt has no needs at this time- just expresses concerns about pending testing for numerous medical concerns- expects to hear from MD office today.  CSW encouraged pt to reach out with any concerns and will continue to follow and assist as needed  Jorge Ny, [place] Worker [facility] [phone]

## 2019-01-08 ENCOUNTER — Other Ambulatory Visit (HOSPITAL_COMMUNITY): Payer: Self-pay

## 2019-01-08 NOTE — Progress Notes (Signed)
Paramedicine Encounter    Patient ID: Danielle [name], female    DOB: [birth year], 73 y.o.   MRN: [id number]    Patient Care Team: Seward Carol, MD as PCP - General (Internal Medicine) Jorge Ny, LCSW as Social Worker (Licensed Clinical Social Worker)  Patient Active Problem List   Diagnosis Date Noted  . Encephalopathy acute 05/20/2018  . 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 Acute kidney failure ([place]) 05/15/2018  . Primary localized osteoarthritis of right knee 05/13/2018  . (HFpEF) heart failure with preserved ejection fraction ([place]) 12/20/2017  . Obesity (BMI 30-39.9) 11/11/2017  . OSA (obstructive sleep apnea) 02/18/2017  . Essential hypertension   . Diabetes mellitus without complication ([place])   . Cancer ([place])   . Thyroid disease     Current Outpatient Medications:  .  allopurinol (ZYLOPRIM) 100 MG tablet, Take 1 tablet (100 mg total) by mouth daily., Disp: , Rfl:  .  apixaban (ELIQUIS) 5 MG TABS tablet, Take 1 tablet (5 mg total) by mouth 2 (two) times daily., Disp: 180 tablet, Rfl: 3 .  atorvastatin (LIPITOR) 80 MG tablet, Take 1 tablet (80 mg total) by mouth at bedtime., Disp: 30 tablet, Rfl: 5 .  calcium citrate (CALCITRATE - DOSED IN MG ELEMENTAL CALCIUM) 950 MG tablet, Take 1 tablet by mouth 2 (two) times daily. , Disp: , Rfl:  .  carvedilol (COREG) 25 MG tablet, Take 1 tablet (25 mg total) by mouth 2 (two) times daily with a meal., Disp: 60 tablet, Rfl: 3 .  empagliflozin (JARDIANCE) 10 MG TABS tablet, Take 10 mg by mouth daily., Disp: 30 tablet, Rfl: 11 .  ferrous sulfate 325 (65 FE) MG tablet, Take 325 mg by mouth 2 (two) times daily with a meal., Disp: , Rfl:  .  gabapentin (NEURONTIN) 300 MG capsule, Take 300 mg by mouth daily. , Disp: , Rfl:  .  isosorbide mononitrate (IMDUR) 30 MG 24 hr tablet, Take 1 tablet (30 mg total) by mouth daily., Disp: 30 tablet, Rfl: 3 .  levothyroxine (SYNTHROID, LEVOTHROID) 75 MCG tablet, Take 75 mcg  by mouth daily before breakfast., Disp: , Rfl:  .  losartan (COZAAR) 50 MG tablet, Take 1 tablet (50 mg total) by mouth at bedtime., Disp: 30 tablet, Rfl: 6 .  omeprazole (PRILOSEC) 20 MG capsule, Take 20 mg by mouth daily. , Disp: , Rfl:  .  potassium chloride (K-DUR,KLOR-CON) 10 MEQ tablet, Take 2 tablets (20 mEq total) by mouth daily., Disp: 60 tablet, Rfl: 5 .  spironolactone (ALDACTONE) 25 MG tablet, Take 1 tablet (25 mg total) by mouth every other day., Disp: 30 tablet, Rfl: 5 .  torsemide (DEMADEX) 20 MG tablet, Take 1 tablet (20 mg total) by mouth daily., Disp: 30 tablet, Rfl: 3 .  acetaminophen (TYLENOL) 500 MG tablet, Take 500-1,000 mg by mouth every 6 (six) hours as needed (for pain or headaches)., Disp: , Rfl:  .  Biotin 10 MG CAPS, Take 10 mg by mouth daily., Disp: , Rfl:  .  CHONDROITIN SULFATE PO, Take 250 mg by mouth daily., Disp: , Rfl:  .  insulin glargine (LANTUS) 100 UNIT/ML injection, Inject 20 Units into the skin 2 (two) times daily after a meal. , Disp: , Rfl:  .  Multiple Vitamins-Minerals (ALIVE ONCE DAILY WOMENS 50+ PO), Take 1 tablet by mouth daily., Disp: , Rfl:  .  nitroGLYCERIN (NITROSTAT) 0.4 MG SL tablet, DISSOLVE ONE TABLET UNDER THE TONGUE EVERY 5 MINUTES AS  NEEDED FOR CHEST PAIN., Disp: 25 tablet, Rfl: 3 .  Travoprost, BAK Free, (TRAVATAN) 0.004 % SOLN ophthalmic solution, Place 1 drop into both eyes at bedtime. , Disp: , Rfl:  Allergies  Allergen Reactions  . Other Other (See Comments)    Blood products- Patient won't accept any  . Sulfa Antibiotics Rash  . Uloric [Febuxostat] Rash     Social History   Socioeconomic History  . Marital status: Widowed    Spouse name: Not on file  . Number of children: 3  . Years of education: Not on file  . Highest education level: Some college, no degree  Occupational History  . Occupation: retired  Scientific laboratory technician  . Financial resource strain: Not hard at all  . Food insecurity    Worry: Never true    Inability:  Never true  . Transportation needs    Medical: No    Non-medical: No  Tobacco Use  . Smoking status: Former Smoker    Packs/day: 1.00    Years: 22.00    Pack years: 22.00    Types: Cigarettes    Quit date: 1983    Years since quitting: 37.6  . Smokeless tobacco: Never Used  Substance and Sexual Activity  . Alcohol use: Yes    Comment: 05/22/2018 "glass of wine couple times/wk"  . Drug use: Not Currently  . Sexual activity: Not Currently    Birth control/protection: None, Post-menopausal  Lifestyle  . Physical activity    Days per week: Not on file    Minutes per session: Not on file  . Stress: Not on file  Relationships  . Social Herbalist on phone: Not on file    Gets together: Not on file    Attends religious service: Not on file    Active member of club or organization: Not on file    Attends meetings of clubs or organizations: Not on file    Relationship status: Not on file  . Intimate partner violence    Fear of current or ex partner: Not on file    Emotionally abused: Not on file    Physically abused: Not on file    Forced sexual activity: Not on file  Other Topics Concern  . Not on file  Social History Narrative  . Not on file    Physical Exam Pulmonary:     Effort: Pulmonary effort is normal. No respiratory distress.     Breath sounds: No wheezing or rales.  Musculoskeletal:        General: No swelling.     Right lower leg: No edema.     Left lower leg: No edema.  Skin:    General: Skin is warm and dry.         Future Appointments  Date Time Provider [place]  02/09/2019 12:00 PM [facility]  03/03/2019  7:20 AM [facility] DEVICE REMOT[facility]  06/02/2019  7:20 AM [facility] DEVICE REMOT[facility]  09/01/2019  7:20 AM [facility] DEVICE REMOT[facility]  12/01/2019  7:20 AM [facility] DEVICE REMOTES [facility]  03/01/2020  7:20 AM [facility]  DEVICE REMOTES [facility]     BP 100/70 (BP Location: Right Arm, Patient Position: Sitting, Cuff Size: Large)   Pulse 67   Temp 98.4 F (36.9 C)   Wt 210 lb (95.3 kg)   SpO2 98%   BMI 36.05 kg/m  CBG 304  Weight yesterday-210 Last visit weight-210  ATF  pt CAO x4 sitting the living room c/o groin pain. She said that the pain is sharp and increases when she walks.  Pt said that she's had this pain in the past, she hasn't taken any tylenol for the pain yet.  Pt denies sob, chest pain and dizziness. She showed me medications that she has that is costing her "too much money". She will talk with Dr. Delfina Redwood during her next visit about cheaper medication options.  Mrs.Esty has taken all of medications for the week.  She ate a large breakfast about 2 hours ago; pt is due to take insulin before dinner.  rx bottles verified and pill box refilled.     Medication ordered: eliquis [id number]  [place], EMT Paramedic [phone] 01/08/2019    ACTION: Home visit completed

## 2019-01-15 ENCOUNTER — Other Ambulatory Visit (HOSPITAL_COMMUNITY): Payer: Self-pay

## 2019-01-15 ENCOUNTER — Telehealth (HOSPITAL_COMMUNITY): Payer: Self-pay

## 2019-01-15 ENCOUNTER — Telehealth (HOSPITAL_COMMUNITY): Payer: Self-pay | Admitting: *Deleted

## 2019-01-15 NOTE — Telephone Encounter (Signed)
I called Danielle [name] to schedule a time for me to come by, we agree on 2:30.

## 2019-01-15 NOTE — Telephone Encounter (Signed)
Dee called to report pt has gained 6 lbs since last week.  She states pt has been taking Torsemide 20 mg daily, advised pt can take an extra 20 mg for next 2 days, if wt not improving to let us know, she is agreeable and will advise pt

## 2019-01-15 NOTE — Progress Notes (Signed)
Paramedicine Encounter    Patient ID: Danielle [name], female    DOB: 10-19-1945, 73 y.o.   MRN: [id number]    Patient Care Team: Seward Carol, MD as PCP - General (Internal Medicine) Jorge Ny, LCSW as Social Worker (Licensed Clinical Social Worker)  Patient Active Problem List   Diagnosis Date Noted  . Encephalopathy acute 05/20/2018  . 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 Acute kidney failure ([place]) 05/15/2018  . Primary localized osteoarthritis of right knee 05/13/2018  . (HFpEF) heart failure with preserved ejection fraction ([place]) 12/20/2017  . Obesity (BMI 30-39.9) 11/11/2017  . OSA (obstructive sleep apnea) 02/18/2017  . Essential hypertension   . Diabetes mellitus without complication ([place])   . Cancer ([place])   . Thyroid disease     Current Outpatient Medications:  .  allopurinol (ZYLOPRIM) 100 MG tablet, Take 1 tablet (100 mg total) by mouth daily., Disp: , Rfl:  .  apixaban (ELIQUIS) 5 MG TABS tablet, Take 1 tablet (5 mg total) by mouth 2 (two) times daily., Disp: 180 tablet, Rfl: 3 .  atorvastatin (LIPITOR) 80 MG tablet, Take 1 tablet (80 mg total) by mouth at bedtime., Disp: 30 tablet, Rfl: 5 .  calcium citrate (CALCITRATE - DOSED IN MG ELEMENTAL CALCIUM) 950 MG tablet, Take 1 tablet by mouth 2 (two) times daily. , Disp: , Rfl:  .  carvedilol (COREG) 25 MG tablet, Take 1 tablet (25 mg total) by mouth 2 (two) times daily with a meal., Disp: 60 tablet, Rfl: 3 .  empagliflozin (JARDIANCE) 10 MG TABS tablet, Take 10 mg by mouth daily., Disp: 30 tablet, Rfl: 11 .  ferrous sulfate 325 (65 FE) MG tablet, Take 325 mg by mouth 2 (two) times daily with a meal., Disp: , Rfl:  .  gabapentin (NEURONTIN) 300 MG capsule, Take 300 mg by mouth daily. , Disp: , Rfl:  .  isosorbide mononitrate (IMDUR) 30 MG 24 hr tablet, Take 1 tablet (30 mg total) by mouth daily., Disp: 30 tablet, Rfl: 3 .  levothyroxine (SYNTHROID, LEVOTHROID) 75 MCG tablet, Take 75 mcg  by mouth daily before breakfast., Disp: , Rfl:  .  losartan (COZAAR) 50 MG tablet, Take 1 tablet (50 mg total) by mouth at bedtime., Disp: 30 tablet, Rfl: 6 .  omeprazole (PRILOSEC) 20 MG capsule, Take 20 mg by mouth daily. , Disp: , Rfl:  .  potassium chloride (K-DUR,KLOR-CON) 10 MEQ tablet, Take 2 tablets (20 mEq total) by mouth daily., Disp: 60 tablet, Rfl: 5 .  spironolactone (ALDACTONE) 25 MG tablet, Take 1 tablet (25 mg total) by mouth every other day., Disp: 30 tablet, Rfl: 5 .  torsemide (DEMADEX) 20 MG tablet, Take 1 tablet (20 mg total) by mouth daily., Disp: 30 tablet, Rfl: 3 .  acetaminophen (TYLENOL) 500 MG tablet, Take 500-1,000 mg by mouth every 6 (six) hours as needed (for pain or headaches)., Disp: , Rfl:  .  Biotin 10 MG CAPS, Take 10 mg by mouth daily., Disp: , Rfl:  .  CHONDROITIN SULFATE PO, Take 250 mg by mouth daily., Disp: , Rfl:  .  insulin glargine (LANTUS) 100 UNIT/ML injection, Inject 20 Units into the skin 2 (two) times daily after a meal. , Disp: , Rfl:  .  Multiple Vitamins-Minerals (ALIVE ONCE DAILY WOMENS 50+ PO), Take 1 tablet by mouth daily., Disp: , Rfl:  .  nitroGLYCERIN (NITROSTAT) 0.4 MG SL tablet, DISSOLVE ONE TABLET UNDER THE TONGUE EVERY 5 MINUTES AS  NEEDED FOR CHEST PAIN., Disp: 25 tablet, Rfl: 3 .  Travoprost, BAK Free, (TRAVATAN) 0.004 % SOLN ophthalmic solution, Place 1 drop into both eyes at bedtime. , Disp: , Rfl:  Allergies  Allergen Reactions  . Other Other (See Comments)    Blood products- Patient won't accept any  . Sulfa Antibiotics Rash  . Uloric [Febuxostat] Rash     Social History   Socioeconomic History  . Marital status: Widowed    Spouse name: Not on file  . Number of children: 3  . Years of education: Not on file  . Highest education level: Some college, no degree  Occupational History  . Occupation: retired  Scientific laboratory technician  . Financial resource strain: Not hard at all  . Food insecurity    Worry: Never true    Inability:  Never true  . Transportation needs    Medical: No    Non-medical: No  Tobacco Use  . Smoking status: Former Smoker    Packs/day: 1.00    Years: 22.00    Pack years: 22.00    Types: Cigarettes    Quit date: 1983    Years since quitting: 37.6  . Smokeless tobacco: Never Used  Substance and Sexual Activity  . Alcohol use: Yes    Comment: 05/22/2018 "glass of wine couple times/wk"  . Drug use: Not Currently  . Sexual activity: Not Currently    Birth control/protection: None, Post-menopausal  Lifestyle  . Physical activity    Days per week: Not on file    Minutes per session: Not on file  . Stress: Not on file  Relationships  . Social Herbalist on phone: Not on file    Gets together: Not on file    Attends religious service: Not on file    Active member of club or organization: Not on file    Attends meetings of clubs or organizations: Not on file    Relationship status: Not on file  . Intimate partner violence    Fear of current or ex partner: Not on file    Emotionally abused: Not on file    Physically abused: Not on file    Forced sexual activity: Not on file  Other Topics Concern  . Not on file  Social History Narrative  . Not on file    Physical Exam      Future Appointments  Date Time Provider [place]  02/09/2019 12:00 PM [facility]  03/03/2019  7:20 AM [facility]ICE [facility]OT[facility]  06/02/2019  7:20 AM [facility] DEVICE REMOTES [facility]  09/01/2019  7:20 AM [facility] DEVICE REMOTES [facility]  12/01/2019  7:20 AM [facility] DEVICE REMOTES [facility]  03/01/2020  7:20 AM [facility] DEVICE REMOTES [facility]     BP 96/66 (BP Location: Right Arm, Patient Position: Sitting, Cuff Size: Large)   Pulse 70   Temp 97.6 F (36.4 C)   Wt 216 lb (98 kg)   SpO2 99%   BMI 37.08 kg/m   Weight yesterday-didn't weigh Last visit  weight-210  ATF pt CAO x4 sitting in the living room watching tv cc back pain.  She said that she "wanted to see a Bone doctor because the pain is becoming too much".  She's taken all of her medications this week. She denies sob, chest pain and dizziness. She said that she's not eating much however, her daughter brings her restaurant food almost daily. I spoke to [place] about  pt's recent weight gain and she advised me to give pt an extra torsemide for today and tomorrow.  I gave her a extra torsemide before I left and I showed her where I placed the one for tomorrow.  rx bottles verified and pill box refilled.     Medication ordered: eliquis  Jakson Delpilar, EMT Paramedic [phone] 01/15/2019    ACTION: Home visit completed

## 2019-01-22 ENCOUNTER — Other Ambulatory Visit (HOSPITAL_COMMUNITY): Payer: Self-pay

## 2019-01-22 NOTE — Progress Notes (Signed)
Paramedicine Encounter    Patient ID: Danielle [name], female    DOB: 01-28-1946, 73 y.o.   MRN: [id number]    Patient Care Team: Seward Carol, MD as PCP - General (Internal Medicine) Jorge Ny, LCSW as Social Worker (Licensed Clinical Social Worker)  Patient Active Problem List   Diagnosis Date Noted  . Encephalopathy acute 05/20/2018  . 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 Acute kidney failure ([place]) 05/15/2018  . Primary localized osteoarthritis of right knee 05/13/2018  . (HFpEF) heart failure with preserved ejection fraction ([place]) 12/20/2017  . Obesity (BMI 30-39.9) 11/11/2017  . OSA (obstructive sleep apnea) 02/18/2017  . Essential hypertension   . Diabetes mellitus without complication ([place])   . Cancer ([place])   . Thyroid disease     Current Outpatient Medications:  .  allopurinol (ZYLOPRIM) 100 MG tablet, Take 1 tablet (100 mg total) by mouth daily., Disp: , Rfl:  .  apixaban (ELIQUIS) 5 MG TABS tablet, Take 1 tablet (5 mg total) by mouth 2 (two) times daily., Disp: 180 tablet, Rfl: 3 .  atorvastatin (LIPITOR) 80 MG tablet, Take 1 tablet (80 mg total) by mouth at bedtime., Disp: 30 tablet, Rfl: 5 .  calcium citrate (CALCITRATE - DOSED IN MG ELEMENTAL CALCIUM) 950 MG tablet, Take 1 tablet by mouth 2 (two) times daily. , Disp: , Rfl:  .  carvedilol (COREG) 25 MG tablet, Take 1 tablet (25 mg total) by mouth 2 (two) times daily with a meal., Disp: 60 tablet, Rfl: 3 .  empagliflozin (JARDIANCE) 10 MG TABS tablet, Take 10 mg by mouth daily., Disp: 30 tablet, Rfl: 11 .  gabapentin (NEURONTIN) 300 MG capsule, Take 300 mg by mouth daily. , Disp: , Rfl:  .  isosorbide mononitrate (IMDUR) 30 MG 24 hr tablet, Take 1 tablet (30 mg total) by mouth daily., Disp: 30 tablet, Rfl: 3 .  levothyroxine (SYNTHROID, LEVOTHROID) 75 MCG tablet, Take 75 mcg by mouth daily before breakfast., Disp: , Rfl:  .  losartan (COZAAR) 50 MG tablet, Take 1 tablet (50 mg total)  by mouth at bedtime., Disp: 30 tablet, Rfl: 6 .  omeprazole (PRILOSEC) 20 MG capsule, Take 20 mg by mouth daily. , Disp: , Rfl:  .  potassium chloride (K-DUR,KLOR-CON) 10 MEQ tablet, Take 2 tablets (20 mEq total) by mouth daily., Disp: 60 tablet, Rfl: 5 .  spironolactone (ALDACTONE) 25 MG tablet, Take 1 tablet (25 mg total) by mouth every other day., Disp: 30 tablet, Rfl: 5 .  torsemide (DEMADEX) 20 MG tablet, Take 1 tablet (20 mg total) by mouth daily., Disp: 30 tablet, Rfl: 3 .  acetaminophen (TYLENOL) 500 MG tablet, Take 500-1,000 mg by mouth every 6 (six) hours as needed (for pain or headaches)., Disp: , Rfl:  .  Biotin 10 MG CAPS, Take 10 mg by mouth daily., Disp: , Rfl:  .  CHONDROITIN SULFATE PO, Take 250 mg by mouth daily., Disp: , Rfl:  .  ferrous sulfate 325 (65 FE) MG tablet, Take 325 mg by mouth 2 (two) times daily with a meal., Disp: , Rfl:  .  insulin glargine (LANTUS) 100 UNIT/ML injection, Inject 20 Units into the skin 2 (two) times daily after a meal. , Disp: , Rfl:  .  Multiple Vitamins-Minerals (ALIVE ONCE DAILY WOMENS 50+ PO), Take 1 tablet by mouth daily., Disp: , Rfl:  .  nitroGLYCERIN (NITROSTAT) 0.4 MG SL tablet, DISSOLVE ONE TABLET UNDER THE TONGUE EVERY 5 MINUTES AS  NEEDED FOR CHEST PAIN., Disp: 25 tablet, Rfl: 3 .  Travoprost, BAK Free, (TRAVATAN) 0.004 % SOLN ophthalmic solution, Place 1 drop into both eyes at bedtime. , Disp: , Rfl:  Allergies  Allergen Reactions  . Other Other (See Comments)    Blood products- Patient won't accept any  . Sulfa Antibiotics Rash  . Uloric [Febuxostat] Rash     Social History   Socioeconomic History  . Marital status: Widowed    Spouse name: Not on file  . Number of children: 3  . Years of education: Not on file  . Highest education level: Some college, no degree  Occupational History  . Occupation: retired  Scientific laboratory technician  . Financial resource strain: Not hard at all  . Food insecurity    Worry: Never true    Inability:  Never true  . Transportation needs    Medical: No    Non-medical: No  Tobacco Use  . Smoking status: Former Smoker    Packs/day: 1.00    Years: 22.00    Pack years: 22.00    Types: Cigarettes    Quit date: 1983    Years since quitting: 37.6  . Smokeless tobacco: Never Used  Substance and Sexual Activity  . Alcohol use: Yes    Comment: 05/22/2018 "glass of wine couple times/wk"  . Drug use: Not Currently  . Sexual activity: Not Currently    Birth control/protection: None, Post-menopausal  Lifestyle  . Physical activity    Days per week: Not on file    Minutes per session: Not on file  . Stress: Not on file  Relationships  . Social Herbalist on phone: Not on file    Gets together: Not on file    Attends religious service: Not on file    Active member of club or organization: Not on file    Attends meetings of clubs or organizations: Not on file    Relationship status: Not on file  . Intimate partner violence    Fear of current or ex partner: Not on file    Emotionally abused: Not on file    Physically abused: Not on file    Forced sexual activity: Not on file  Other Topics Concern  . Not on file  Social History Narrative  . Not on file    Physical Exam Musculoskeletal:        General: No swelling.     Right lower leg: No edema.     Left lower leg: No edema.  Skin:    General: Skin is warm and dry.         Future Appointments  Date Time Provider [place]  02/09/2019 12:00 PM [facility] MM 3 [facility]  03/03/2019  7:20 AM [facility] DEVICE REMOTES [facility]  06/02/2019  7:20 AM [facility] DEVICE REMOTES [facility]  09/01/2019  7:20 AM [facility] DEVICE [facility]OT[facility]  12/01/2019  7:20 AM [facility] DEVICE REMOTES [facility]  03/01/2020  7:20 AM [facility] DEVICE [facility]OT[facility]     BP 104/70 (BP Location: Right Arm, Patient Position: Sitting, Cuff Size:  Large)   Pulse 78   Temp 98 F (36.7 C)   Wt 217 lb (98.4 kg)   SpO2 97%   BMI 37.25 kg/m  CBG 189  Weight yesterday-didn't weigh Last visit weight-216  ATF pt CAO x4 c/o pain in the groin area.  She had the same complaints last week. She tried scheduling an appointment with  the OB-GYN but hasn't received a call back. No other complaints noted; she denies sob, chest pain and dizziness. She's taken all of her meds for the week.  She's currently out of eliquis but her daughter will pick them up for her today or tomorrow.  rx bottles and pill box refilled.    Medication ordered: eliquis Isosorbide Potassium  Carvedilol Atorvastatin  Amena Dockham, EMT Paramedic [phone] 01/22/2019    ACTION: Home visit completed

## 2019-01-29 ENCOUNTER — Telehealth (HOSPITAL_COMMUNITY): Payer: Self-pay

## 2019-01-29 DIAGNOSIS — Z6837 Body mass index (BMI) 37.0-37.9, adult: Secondary | ICD-10-CM | POA: Diagnosis not present

## 2019-01-29 DIAGNOSIS — E118 Type 2 diabetes mellitus with unspecified complications: Secondary | ICD-10-CM | POA: Diagnosis not present

## 2019-01-29 DIAGNOSIS — M1A09X1 Idiopathic chronic gout, multiple sites, with tophus (tophi): Secondary | ICD-10-CM | POA: Diagnosis not present

## 2019-01-29 DIAGNOSIS — E669 Obesity, unspecified: Secondary | ICD-10-CM | POA: Diagnosis not present

## 2019-01-29 DIAGNOSIS — M2550 Pain in unspecified joint: Secondary | ICD-10-CM | POA: Diagnosis not present

## 2019-01-30 ENCOUNTER — Other Ambulatory Visit (HOSPITAL_COMMUNITY): Payer: Self-pay

## 2019-01-30 DIAGNOSIS — L989 Disorder of the skin and subcutaneous tissue, unspecified: Secondary | ICD-10-CM | POA: Diagnosis not present

## 2019-01-30 DIAGNOSIS — L853 Xerosis cutis: Secondary | ICD-10-CM | POA: Diagnosis not present

## 2019-01-30 DIAGNOSIS — Z01419 Encounter for gynecological examination (general) (routine) without abnormal findings: Secondary | ICD-10-CM | POA: Diagnosis not present

## 2019-01-30 NOTE — Progress Notes (Signed)
Paramedicine Encounter    Patient ID: Danielle [name], female    DOB: Oct 13, 1945, 73 y.o.   MRN: [id number]    Patient Care Team: Seward Carol, MD as PCP - General (Internal Medicine) Jorge Ny, LCSW as Social Worker (Licensed Clinical Social Worker)  Patient Active Problem List   Diagnosis Date Noted  . Encephalopathy acute 05/20/2018  . 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 Acute kidney failure ([place]) 05/15/2018  . Primary localized osteoarthritis of right knee 05/13/2018  . (HFpEF) heart failure with preserved ejection fraction ([place]) 12/20/2017  . Obesity (BMI 30-39.9) 11/11/2017  . OSA (obstructive sleep apnea) 02/18/2017  . Essential hypertension   . Diabetes mellitus without complication ([place])   . Cancer ([place])   . Thyroid disease     Current Outpatient Medications:  .  allopurinol (ZYLOPRIM) 100 MG tablet, Take 1 tablet (100 mg total) by mouth daily., Disp: , Rfl:  .  apixaban (ELIQUIS) 5 MG TABS tablet, Take 1 tablet (5 mg total) by mouth 2 (two) times daily., Disp: 180 tablet, Rfl: 3 .  atorvastatin (LIPITOR) 80 MG tablet, Take 1 tablet (80 mg total) by mouth at bedtime., Disp: 30 tablet, Rfl: 5 .  calcium citrate (CALCITRATE - DOSED IN MG ELEMENTAL CALCIUM) 950 MG tablet, Take 1 tablet by mouth 2 (two) times daily. , Disp: , Rfl:  .  carvedilol (COREG) 25 MG tablet, Take 1 tablet (25 mg total) by mouth 2 (two) times daily with a meal., Disp: 60 tablet, Rfl: 3 .  empagliflozin (JARDIANCE) 10 MG TABS tablet, Take 10 mg by mouth daily., Disp: 30 tablet, Rfl: 11 .  ferrous sulfate 325 (65 FE) MG tablet, Take 325 mg by mouth 2 (two) times daily with a meal., Disp: , Rfl:  .  gabapentin (NEURONTIN) 300 MG capsule, Take 300 mg by mouth daily. , Disp: , Rfl:  .  insulin glargine (LANTUS) 100 UNIT/ML injection, Inject 20 Units into the skin 2 (two) times daily after a meal. , Disp: , Rfl:  .  isosorbide mononitrate (IMDUR) 30 MG 24 hr tablet, Take  1 tablet (30 mg total) by mouth daily., Disp: 30 tablet, Rfl: 3 .  levothyroxine (SYNTHROID, LEVOTHROID) 75 MCG tablet, Take 75 mcg by mouth daily before breakfast., Disp: , Rfl:  .  losartan (COZAAR) 50 MG tablet, Take 1 tablet (50 mg total) by mouth at bedtime., Disp: 30 tablet, Rfl: 6 .  Multiple Vitamins-Minerals (ALIVE ONCE DAILY WOMENS 50+ PO), Take 1 tablet by mouth daily., Disp: , Rfl:  .  omeprazole (PRILOSEC) 20 MG capsule, Take 20 mg by mouth daily. , Disp: , Rfl:  .  potassium chloride (K-DUR,KLOR-CON) 10 MEQ tablet, Take 2 tablets (20 mEq total) by mouth daily., Disp: 60 tablet, Rfl: 5 .  spironolactone (ALDACTONE) 25 MG tablet, Take 1 tablet (25 mg total) by mouth every other day., Disp: 30 tablet, Rfl: 5 .  torsemide (DEMADEX) 20 MG tablet, Take 1 tablet (20 mg total) by mouth daily., Disp: 30 tablet, Rfl: 3 .  acetaminophen (TYLENOL) 500 MG tablet, Take 500-1,000 mg by mouth every 6 (six) hours as needed (for pain or headaches)., Disp: , Rfl:  .  Biotin 10 MG CAPS, Take 10 mg by mouth daily., Disp: , Rfl:  .  CHONDROITIN SULFATE PO, Take 250 mg by mouth daily., Disp: , Rfl:  .  nitroGLYCERIN (NITROSTAT) 0.4 MG SL tablet, DISSOLVE ONE TABLET UNDER THE TONGUE EVERY 5 MINUTES AS  NEEDED FOR CHEST PAIN., Disp: 25 tablet, Rfl: 3 .  Travoprost, BAK Free, (TRAVATAN) 0.004 % SOLN ophthalmic solution, Place 1 drop into both eyes at bedtime. , Disp: , Rfl:  Allergies  Allergen Reactions  . Other Other (See Comments)    Blood products- Patient won't accept any  . Sulfa Antibiotics Rash  . Uloric [Febuxostat] Rash     Social History   Socioeconomic History  . Marital status: Widowed    Spouse name: Not on file  . Number of children: 3  . Years of education: Not on file  . Highest education level: Some college, no degree  Occupational History  . Occupation: retired  Scientific laboratory technician  . Financial resource strain: Not hard at all  . Food insecurity    Worry: Never true    Inability:  Never true  . Transportation needs    Medical: No    Non-medical: No  Tobacco Use  . Smoking status: Former Smoker    Packs/day: 1.00    Years: 22.00    Pack years: 22.00    Types: Cigarettes    Quit date: 1983    Years since quitting: 37.7  . Smokeless tobacco: Never Used  Substance and Sexual Activity  . Alcohol use: Yes    Comment: 05/22/2018 "glass of wine couple times/wk"  . Drug use: Not Currently  . Sexual activity: Not Currently    Birth control/protection: None, Post-menopausal  Lifestyle  . Physical activity    Days per week: Not on file    Minutes per session: Not on file  . Stress: Not on file  Relationships  . Social Herbalist on phone: Not on file    Gets together: Not on file    Attends religious service: Not on file    Active member of club or organization: Not on file    Attends meetings of clubs or organizations: Not on file    Relationship status: Not on file  . Intimate partner violence    Fear of current or ex partner: Not on file    Emotionally abused: Not on file    Physically abused: Not on file    Forced sexual activity: Not on file  Other Topics Concern  . Not on file  Social History Narrative  . Not on file    Physical Exam Pulmonary:     Effort: No respiratory distress.     Breath sounds: No wheezing or rales.  Musculoskeletal:     Right lower leg: No edema.     Left lower leg: No edema.  Skin:    General: Skin is warm and dry.         Future Appointments  Date Time Provider [place]  02/09/2019 12:00 PM [facility] MM 3 [facility]  03/03/2019  7:20 AM [facility]ICE [facility]OT[facility]  06/02/2019  7:20 AM [facility]  09/01/2019  7:20 AM [facility]  12/01/2019  7:20 AM [facility]  03/01/2020  7:20 AM [facility]     BP 108/76  (BP Location: Right Arm, Patient Position: Sitting, Cuff Size: Large)   Pulse 70   Temp 98.6 F (37 C)   SpO2 99%    ATF pt CAO x4; she just came in from seeing the  rheumatologist.  He increased allopurinol to 200 mg daily and began colchicine.  Pt stated that she was in a lot of  pain yesterday, she got a cortisone shot, she feels better today.  She denies sob, chest pain and dizziness. She's taken all of her medications. rx bottles verified and pill box refilled.   I spoke with the pharmacist at [facility], she said that due to possible drug interactions (carvedilol and colchicine) they are holding the prescription until it's verified by her physician.    allunprinol changed to 200 daily Start colchicine every other day  Medication ordered: Gabapentin losartatin  Brittain Smithey, EMT Paramedic [phone] 01/30/2019    ACTION: Home visit completed

## 2019-01-30 NOTE — Telephone Encounter (Signed)
I called Danielle [name] to let her know that I was on my way, she said that she's at the doctors office and would like me to come tomorrow.

## 2019-02-05 ENCOUNTER — Telehealth (HOSPITAL_COMMUNITY): Payer: Self-pay

## 2019-02-05 ENCOUNTER — Other Ambulatory Visit (HOSPITAL_COMMUNITY): Payer: Self-pay

## 2019-02-05 NOTE — Progress Notes (Signed)
Paramedicine Encounter    Patient ID: Danielle [name], female    DOB: Apr 05, 1946, 73 y.o.   MRN: [id number]    Patient Care Team: Seward Carol, MD as PCP - General (Internal Medicine) Jorge Ny, LCSW as Social Worker (Licensed Clinical Social Worker)  Patient Active Problem List   Diagnosis Date Noted  . Encephalopathy acute 05/20/2018  . 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 Acute kidney failure ([place]) 05/15/2018  . Primary localized osteoarthritis of right knee 05/13/2018  . (HFpEF) heart failure with preserved ejection fraction ([place]) 12/20/2017  . Obesity (BMI 30-39.9) 11/11/2017  . OSA (obstructive sleep apnea) 02/18/2017  . Essential hypertension   . Diabetes mellitus without complication ([place])   . Cancer ([place])   . Thyroid disease     Current Outpatient Medications:  .  allopurinol (ZYLOPRIM) 100 MG tablet, Take 1 tablet (100 mg total) by mouth daily., Disp: , Rfl:  .  apixaban (ELIQUIS) 5 MG TABS tablet, Take 1 tablet (5 mg total) by mouth 2 (two) times daily., Disp: 180 tablet, Rfl: 3 .  atorvastatin (LIPITOR) 80 MG tablet, Take 1 tablet (80 mg total) by mouth at bedtime., Disp: 30 tablet, Rfl: 5 .  calcium citrate (CALCITRATE - DOSED IN MG ELEMENTAL CALCIUM) 950 MG tablet, Take 1 tablet by mouth 2 (two) times daily. , Disp: , Rfl:  .  carvedilol (COREG) 25 MG tablet, Take 1 tablet (25 mg total) by mouth 2 (two) times daily with a meal., Disp: 60 tablet, Rfl: 3 .  empagliflozin (JARDIANCE) 10 MG TABS tablet, Take 10 mg by mouth daily., Disp: 30 tablet, Rfl: 11 .  ferrous sulfate 325 (65 FE) MG tablet, Take 325 mg by mouth 2 (two) times daily with a meal., Disp: , Rfl:  .  gabapentin (NEURONTIN) 300 MG capsule, Take 300 mg by mouth daily. , Disp: , Rfl:  .  isosorbide mononitrate (IMDUR) 30 MG 24 hr tablet, Take 1 tablet (30 mg total) by mouth daily., Disp: 30 tablet, Rfl: 3 .  levothyroxine (SYNTHROID, LEVOTHROID) 75 MCG tablet, Take 75 mcg  by mouth daily before breakfast., Disp: , Rfl:  .  Multiple Vitamins-Minerals (ALIVE ONCE DAILY WOMENS 50+ PO), Take 1 tablet by mouth daily., Disp: , Rfl:  .  omeprazole (PRILOSEC) 20 MG capsule, Take 20 mg by mouth daily. , Disp: , Rfl:  .  potassium chloride (K-DUR,KLOR-CON) 10 MEQ tablet, Take 2 tablets (20 mEq total) by mouth daily., Disp: 60 tablet, Rfl: 5 .  spironolactone (ALDACTONE) 25 MG tablet, Take 1 tablet (25 mg total) by mouth every other day., Disp: 30 tablet, Rfl: 5 .  torsemide (DEMADEX) 20 MG tablet, Take 1 tablet (20 mg total) by mouth daily., Disp: 30 tablet, Rfl: 3 .  acetaminophen (TYLENOL) 500 MG tablet, Take 500-1,000 mg by mouth every 6 (six) hours as needed (for pain or headaches)., Disp: , Rfl:  .  Biotin 10 MG CAPS, Take 10 mg by mouth daily., Disp: , Rfl:  .  CHONDROITIN SULFATE PO, Take 250 mg by mouth daily., Disp: , Rfl:  .  insulin glargine (LANTUS) 100 UNIT/ML injection, Inject 20 Units into the skin 2 (two) times daily after a meal. , Disp: , Rfl:  .  losartan (COZAAR) 50 MG tablet, Take 1 tablet (50 mg total) by mouth at bedtime., Disp: 30 tablet, Rfl: 6 .  nitroGLYCERIN (NITROSTAT) 0.4 MG SL tablet, DISSOLVE ONE TABLET UNDER THE TONGUE EVERY 5 MINUTES AS  NEEDED FOR CHEST PAIN., Disp: 25 tablet, Rfl: 3 .  Travoprost, BAK Free, (TRAVATAN) 0.004 % SOLN ophthalmic solution, Place 1 drop into both eyes at bedtime. , Disp: , Rfl:  Allergies  Allergen Reactions  . Other Other (See Comments)    Blood products- Patient won't accept any  . Sulfa Antibiotics Rash  . Uloric [Febuxostat] Rash     Social History   Socioeconomic History  . Marital status: Widowed    Spouse name: Not on file  . Number of children: 3  . Years of education: Not on file  . Highest education level: Some college, no degree  Occupational History  . Occupation: retired  Scientific laboratory technician  . Financial resource strain: Not hard at all  . Food insecurity    Worry: Never true    Inability:  Never true  . Transportation needs    Medical: No    Non-medical: No  Tobacco Use  . Smoking status: Former Smoker    Packs/day: 1.00    Years: 22.00    Pack years: 22.00    Types: Cigarettes    Quit date: 1983    Years since quitting: 37.7  . Smokeless tobacco: Never Used  Substance and Sexual Activity  . Alcohol use: Yes    Comment: 05/22/2018 "glass of wine couple times/wk"  . Drug use: Not Currently  . Sexual activity: Not Currently    Birth control/protection: None, Post-menopausal  Lifestyle  . Physical activity    Days per week: Not on file    Minutes per session: Not on file  . Stress: Not on file  Relationships  . Social Herbalist on phone: Not on file    Gets together: Not on file    Attends religious service: Not on file    Active member of club or organization: Not on file    Attends meetings of clubs or organizations: Not on file    Relationship status: Not on file  . Intimate partner violence    Fear of current or ex partner: Not on file    Emotionally abused: Not on file    Physically abused: Not on file    Forced sexual activity: Not on file  Other Topics Concern  . Not on file  Social History Narrative  . Not on file    Physical Exam Pulmonary:     Effort: Pulmonary effort is normal. No respiratory distress.     Breath sounds: No wheezing.  Musculoskeletal:        General: No swelling.  Skin:    General: Skin is warm and dry.         Future Appointments  Date Time Provider [place]  02/09/2019 12:00 PM [facility] MM 3 [facility]  03/03/2019  7:20 AM [facility] DEVICE [facility]OT[facility]  06/02/2019  7:20 AM [facility]ICE [facility]OT[facility]  09/01/2019  7:20 AM [facility]  12/01/2019  7:20 AM [facility]OTES [facility]  03/01/2020  7:20 AM [facility]     BP 98/72 (BP Location:  Right Arm, Patient Position: Sitting, Cuff Size: Large)   Pulse 65   Temp 98 F (36.7 C)   Wt 213 lb (96.6 kg)   SpO2 97%   BMI 36.56 kg/m  CBG 260  Weight yesterday-214 Last visit weight-217 (01/22/19)  ATF pt CAO x4 cleaning her apartment and waiting on the maintenance man. She said that her granddaughter is in  town and is helping her clean out her apartment.  She said that she's tired and will probably go to sleep soon. She said that she's taking naps throughout the day.  Mrs. Barsanti said that she's wearing the CPAP to sleep at night but she takes it off to go to the bathroom and doesn't put it back on.  I told her that not wearing her CPAP at night may have something to do with her tiredness.  She denies sob, chest pain and dizziness. rx bottles verified and pill box refilled.   She said that her granddaughter just left out to pick up 3 medication refills. She doesn't know what is ready.  She'll call me if she has any questions about where to put her meds once she gets them.  Medication ordered: Gabapentin   Rowan Pollman, EMT Paramedic [phone] 02/05/2019    ACTION: Home visit completed

## 2019-02-05 NOTE — Telephone Encounter (Signed)
I called pt to schedule a time for me to come by today. She said that she'll be home for the rest of the day.

## 2019-02-09 ENCOUNTER — Ambulatory Visit
Admission: RE | Admit: 2019-02-09 | Discharge: 2019-02-09 | Disposition: A | Discharge status: Home / Self Care | Payer: Medicare HMO | Source: Ambulatory Visit | Attending: Internal Medicine | Admitting: Internal Medicine

## 2019-02-09 ENCOUNTER — Other Ambulatory Visit: Payer: Self-pay

## 2019-02-09 DIAGNOSIS — Z1231 Encounter for screening mammogram for malignant neoplasm of breast: Secondary | ICD-10-CM

## 2019-02-11 DIAGNOSIS — H3561 Retinal hemorrhage, right eye: Secondary | ICD-10-CM | POA: Diagnosis not present

## 2019-02-11 DIAGNOSIS — H3562 Retinal hemorrhage, left eye: Secondary | ICD-10-CM | POA: Diagnosis not present

## 2019-02-11 DIAGNOSIS — E113411 Type 2 diabetes mellitus with severe nonproliferative diabetic retinopathy with macular edema, right eye: Secondary | ICD-10-CM | POA: Diagnosis not present

## 2019-02-11 DIAGNOSIS — E113412 Type 2 diabetes mellitus with severe nonproliferative diabetic retinopathy with macular edema, left eye: Secondary | ICD-10-CM | POA: Diagnosis not present

## 2019-02-12 ENCOUNTER — Other Ambulatory Visit (HOSPITAL_COMMUNITY): Payer: Self-pay

## 2019-02-12 NOTE — Progress Notes (Signed)
Paramedicine Encounter    Patient ID: Danielle [name], female    DOB: 31-Dec-1945, 73 y.o.   MRN: [id number]    Patient Care Team: Seward Carol, MD as PCP - General (Internal Medicine) Jorge Ny, LCSW as Social Worker (Licensed Clinical Social Worker)  Patient Active Problem List   Diagnosis Date Noted  . Encephalopathy acute 05/20/2018  . 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 Acute kidney failure ([place]) 05/15/2018  . Primary localized osteoarthritis of right knee 05/13/2018  . (HFpEF) heart failure with preserved ejection fraction ([place]) 12/20/2017  . Obesity (BMI 30-39.9) 11/11/2017  . OSA (obstructive sleep apnea) 02/18/2017  . Essential hypertension   . Diabetes mellitus without complication ([place])   . Cancer ([place])   . Thyroid disease     Current Outpatient Medications:  .  acetaminophen (TYLENOL) 500 MG tablet, Take 500-1,000 mg by mouth every 6 (six) hours as needed (for pain or headaches)., Disp: , Rfl:  .  allopurinol (ZYLOPRIM) 100 MG tablet, Take 1 tablet (100 mg total) by mouth daily., Disp: , Rfl:  .  apixaban (ELIQUIS) 5 MG TABS tablet, Take 1 tablet (5 mg total) by mouth 2 (two) times daily., Disp: 180 tablet, Rfl: 3 .  atorvastatin (LIPITOR) 80 MG tablet, Take 1 tablet (80 mg total) by mouth at bedtime., Disp: 30 tablet, Rfl: 5 .  Biotin 10 MG CAPS, Take 10 mg by mouth daily., Disp: , Rfl:  .  calcium citrate (CALCITRATE - DOSED IN MG ELEMENTAL CALCIUM) 950 MG tablet, Take 1 tablet by mouth 2 (two) times daily. , Disp: , Rfl:  .  carvedilol (COREG) 25 MG tablet, Take 1 tablet (25 mg total) by mouth 2 (two) times daily with a meal., Disp: 60 tablet, Rfl: 3 .  CHONDROITIN SULFATE PO, Take 250 mg by mouth daily., Disp: , Rfl:  .  empagliflozin (JARDIANCE) 10 MG TABS tablet, Take 10 mg by mouth daily., Disp: 30 tablet, Rfl: 11 .  ferrous sulfate 325 (65 FE) MG tablet, Take 325 mg by mouth 2 (two) times daily with a meal., Disp: , Rfl:  .   gabapentin (NEURONTIN) 300 MG capsule, Take 300 mg by mouth daily. , Disp: , Rfl:  .  insulin glargine (LANTUS) 100 UNIT/ML injection, Inject 20 Units into the skin 2 (two) times daily after a meal. , Disp: , Rfl:  .  isosorbide mononitrate (IMDUR) 30 MG 24 hr tablet, Take 1 tablet (30 mg total) by mouth daily., Disp: 30 tablet, Rfl: 3 .  levothyroxine (SYNTHROID, LEVOTHROID) 75 MCG tablet, Take 75 mcg by mouth daily before breakfast., Disp: , Rfl:  .  losartan (COZAAR) 50 MG tablet, Take 1 tablet (50 mg total) by mouth at bedtime., Disp: 30 tablet, Rfl: 6 .  Multiple Vitamins-Minerals (ALIVE ONCE DAILY WOMENS 50+ PO), Take 1 tablet by mouth daily., Disp: , Rfl:  .  nitroGLYCERIN (NITROSTAT) 0.4 MG SL tablet, DISSOLVE ONE TABLET UNDER THE TONGUE EVERY 5 MINUTES AS NEEDED FOR CHEST PAIN., Disp: 25 tablet, Rfl: 3 .  omeprazole (PRILOSEC) 20 MG capsule, Take 20 mg by mouth daily. , Disp: , Rfl:  .  potassium chloride (K-DUR,KLOR-CON) 10 MEQ tablet, Take 2 tablets (20 mEq total) by mouth daily., Disp: 60 tablet, Rfl: 5 .  spironolactone (ALDACTONE) 25 MG tablet, Take 1 tablet (25 mg total) by mouth every other day., Disp: 30 tablet, Rfl: 5 .  torsemide (DEMADEX) 20 MG tablet, Take 1 tablet (20 mg  total) by mouth daily., Disp: 30 tablet, Rfl: 3 .  Travoprost, BAK Free, (TRAVATAN) 0.004 % SOLN ophthalmic solution, Place 1 drop into both eyes at bedtime. , Disp: , Rfl:  Allergies  Allergen Reactions  . Other Other (See Comments)    Blood products- Patient won't accept any  . Sulfa Antibiotics Rash  . Uloric [Febuxostat] Rash     Social History   Socioeconomic History  . Marital status: Widowed    Spouse name: Not on file  . Number of children: 3  . Years of education: Not on file  . Highest education level: Some college, no degree  Occupational History  . Occupation: retired  Scientific laboratory technician  . Financial resource strain: Not hard at all  . Food insecurity    Worry: Never true    Inability:  Never true  . Transportation needs    Medical: No    Non-medical: No  Tobacco Use  . Smoking status: Former Smoker    Packs/day: 1.00    Years: 22.00    Pack years: 22.00    Types: Cigarettes    Quit date: 1983    Years since quitting: 37.7  . Smokeless tobacco: Never Used  Substance and Sexual Activity  . Alcohol use: Yes    Comment: 05/22/2018 "glass of wine couple times/wk"  . Drug use: Not Currently  . Sexual activity: Not Currently    Birth control/protection: None, Post-menopausal  Lifestyle  . Physical activity    Days per week: Not on file    Minutes per session: Not on file  . Stress: Not on file  Relationships  . Social Herbalist on phone: Not on file    Gets together: Not on file    Attends religious service: Not on file    Active member of club or organization: Not on file    Attends meetings of clubs or organizations: Not on file    Relationship status: Not on file  . Intimate partner violence    Fear of current or ex partner: Not on file    Emotionally abused: Not on file    Physically abused: Not on file    Forced sexual activity: Not on file  Other Topics Concern  . Not on file  Social History Narrative  . Not on file    Physical Exam Pulmonary:     Effort: Pulmonary effort is normal. No respiratory distress.     Breath sounds: No wheezing or rales.  Abdominal:     General: There is no distension.  Skin:    General: Skin is warm and dry.         Future Appointments  Date Time Provider Ethan  03/03/2019  7:20 AM [facility]  06/02/2019  7:20 AM [facility]  09/01/2019  7:20 AM [facility]  12/01/2019  7:20 AM [facility]  03/01/2020  7:20 AM [facility]     BP 116/70 (BP Location: Right Arm, Patient Position: Sitting, Cuff  Size: Normal)   Pulse 78   Temp 98.6 F (37 C)   Wt 195 lb (88.5 kg)   SpO2 98%   BMI 33.47 kg/m  cbg 300  Weight yesterday-didn't weigh Last visit weight-213 (9/10)  ATF pt CAO x4 cleaning up her apartment.  She said that she's been exercising a little more now, chair exercises and squats (to help with the groin  pain).  She's taken all of her medications for the past week.  She denies sob, chest pain and dizziness.  She needed assistance with her cpap, adjusting the straps.  She said that last night she was urinating a lot and she's been thirsty.  Her cbg is @ 300, which is a lot higher than it's been recently. We discussed the possible symptoms of her increased blood sugars. Her weight is significantly less than last week, I'm unsure if these results are correct.  I will f/u with her tomorrow to see if that weight is consistently. rx bottles verified and pill box refilled.    Medication ordered: eliquis   Calle Schader, EMT Paramedic [phone] 02/12/2019    ACTION: Home visit completed

## 2019-02-13 DIAGNOSIS — L218 Other seborrheic dermatitis: Secondary | ICD-10-CM | POA: Diagnosis not present

## 2019-02-13 DIAGNOSIS — L821 Other seborrheic keratosis: Secondary | ICD-10-CM | POA: Diagnosis not present

## 2019-02-18 DIAGNOSIS — E113412 Type 2 diabetes mellitus with severe nonproliferative diabetic retinopathy with macular edema, left eye: Secondary | ICD-10-CM | POA: Diagnosis not present

## 2019-02-18 DIAGNOSIS — E113411 Type 2 diabetes mellitus with severe nonproliferative diabetic retinopathy with macular edema, right eye: Secondary | ICD-10-CM | POA: Diagnosis not present

## 2019-02-20 ENCOUNTER — Other Ambulatory Visit (HOSPITAL_COMMUNITY): Payer: Self-pay | Admitting: Internal Medicine

## 2019-02-20 ENCOUNTER — Other Ambulatory Visit (HOSPITAL_COMMUNITY): Payer: Self-pay | Admitting: *Deleted

## 2019-02-20 ENCOUNTER — Other Ambulatory Visit (HOSPITAL_COMMUNITY): Payer: Self-pay

## 2019-02-20 MED ORDER — TORSEMIDE 20 MG PO TABS: 20.0000 mg | ORAL_TABLET | Freq: Every day | ORAL | 3 refills | Status: DC

## 2019-02-20 MED ORDER — ATORVASTATIN CALCIUM 80 MG PO TABS: 80.0000 mg | ORAL_TABLET | Freq: Every day | ORAL | 5 refills | Status: DC

## 2019-02-20 MED ORDER — POTASSIUM CHLORIDE CRYS ER 10 MEQ PO TBCR: 20.0000 meq | EXTENDED_RELEASE_TABLET | Freq: Every day | ORAL | 5 refills | Status: DC

## 2019-02-20 NOTE — Progress Notes (Signed)
Paramedicine Encounter    Patient ID: Danielle [name], female    DOB: [birth year], 73 y.o.   MRN: [id number]    Patient Care Team: Seward Carol, MD as PCP - General (Internal Medicine) Jorge Ny, LCSW as Social Worker (Licensed Clinical Social Worker)  Patient Active Problem List   Diagnosis Date Noted  . Encephalopathy acute 05/20/2018  . 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 Acute kidney failure ([place]) 05/15/2018  . Primary localized osteoarthritis of right knee 05/13/2018  . (HFpEF) heart failure with preserved ejection fraction ([place]) 12/20/2017  . Obesity (BMI 30-39.9) 11/11/2017  . OSA (obstructive sleep apnea) 02/18/2017  . Essential hypertension   . Diabetes mellitus without complication ([place])   . Cancer ([place])   . Thyroid disease     Current Outpatient Medications:  .  allopurinol (ZYLOPRIM) 100 MG tablet, Take 1 tablet (100 mg total) by mouth daily., Disp: , Rfl:  .  apixaban (ELIQUIS) 5 MG TABS tablet, Take 1 tablet (5 mg total) by mouth 2 (two) times daily., Disp: 180 tablet, Rfl: 3 .  calcium citrate (CALCITRATE - DOSED IN MG ELEMENTAL CALCIUM) 950 MG tablet, Take 1 tablet by mouth 2 (two) times daily. , Disp: , Rfl:  .  carvedilol (COREG) 25 MG tablet, Take 1 tablet (25 mg total) by mouth 2 (two) times daily with a meal., Disp: 60 tablet, Rfl: 3 .  ferrous sulfate 325 (65 FE) MG tablet, Take 325 mg by mouth 2 (two) times daily with a meal., Disp: , Rfl:  .  gabapentin (NEURONTIN) 300 MG capsule, Take 300 mg by mouth daily. , Disp: , Rfl:  .  levothyroxine (SYNTHROID, LEVOTHROID) 75 MCG tablet, Take 75 mcg by mouth daily before breakfast., Disp: , Rfl:  .  losartan (COZAAR) 50 MG tablet, Take 1 tablet (50 mg total) by mouth at bedtime., Disp: 30 tablet, Rfl: 6 .  Multiple Vitamins-Minerals (ALIVE ONCE DAILY WOMENS 50+ PO), Take 1 tablet by mouth daily., Disp: , Rfl:  .  omeprazole (PRILOSEC) 20 MG capsule, Take 20 mg by mouth daily. ,  Disp: , Rfl:  .  spironolactone (ALDACTONE) 25 MG tablet, Take 1 tablet (25 mg total) by mouth every other day., Disp: 30 tablet, Rfl: 5 .  acetaminophen (TYLENOL) 500 MG tablet, Take 500-1,000 mg by mouth every 6 (six) hours as needed (for pain or headaches)., Disp: , Rfl:  .  atorvastatin (LIPITOR) 80 MG tablet, Take 1 tablet (80 mg total) by mouth at bedtime., Disp: 30 tablet, Rfl: 5 .  Biotin 10 MG CAPS, Take 10 mg by mouth daily., Disp: , Rfl:  .  CHONDROITIN SULFATE PO, Take 250 mg by mouth daily., Disp: , Rfl:  .  insulin glargine (LANTUS) 100 UNIT/ML injection, Inject 20 Units into the skin 2 (two) times daily after a meal. , Disp: , Rfl:  .  isosorbide mononitrate (IMDUR) 30 MG 24 hr tablet, Take 1 tablet by mouth once daily, Disp: 30 tablet, Rfl: 0 .  JARDIANCE 10 MG TABS tablet, Take 1 tablet by mouth once daily, Disp: 90 tablet, Rfl: 0 .  nitroGLYCERIN (NITROSTAT) 0.4 MG SL tablet, DISSOLVE ONE TABLET UNDER THE TONGUE EVERY 5 MINUTES AS NEEDED FOR CHEST PAIN., Disp: 25 tablet, Rfl: 3 .  potassium chloride (K-DUR) 10 MEQ tablet, Take 2 tablets by mouth once daily, Disp: 60 tablet, Rfl: 0 .  torsemide (DEMADEX) 20 MG tablet, Take 1 tablet (20 mg total) by mouth daily.,  Disp: 30 tablet, Rfl: 3 .  Travoprost, BAK Free, (TRAVATAN) 0.004 % SOLN ophthalmic solution, Place 1 drop into both eyes at bedtime. , Disp: , Rfl:  Allergies  Allergen Reactions  . Other Other (See Comments)    Blood products- Patient won't accept any  . Sulfa Antibiotics Rash  . Uloric [Febuxostat] Rash     Social History   Socioeconomic History  . Marital status: Widowed    Spouse name: Not on file  . Number of children: 3  . Years of education: Not on file  . Highest education level: Some college, no degree  Occupational History  . Occupation: retired  Scientific laboratory technician  . Financial resource strain: Not hard at all  . Food insecurity    Worry: Never true    Inability: Never true  . Transportation needs     Medical: No    Non-medical: No  Tobacco Use  . Smoking status: Former Smoker    Packs/day: 1.00    Years: 22.00    Pack years: 22.00    Types: Cigarettes    Quit date: 1983    Years since quitting: 37.7  . Smokeless tobacco: Never Used  Substance and Sexual Activity  . Alcohol use: Yes    Comment: 05/22/2018 "glass of wine couple times/wk"  . Drug use: Not Currently  . Sexual activity: Not Currently    Birth control/protection: None, Post-menopausal  Lifestyle  . Physical activity    Days per week: Not on file    Minutes per session: Not on file  . Stress: Not on file  Relationships  . Social Herbalist on phone: Not on file    Gets together: Not on file    Attends religious service: Not on file    Active member of club or organization: Not on file    Attends meetings of clubs or organizations: Not on file    Relationship status: Not on file  . Intimate partner violence    Fear of current or ex partner: Not on file    Emotionally abused: Not on file    Physically abused: Not on file    Forced sexual activity: Not on file  Other Topics Concern  . Not on file  Social History Narrative  . Not on file    Physical Exam Pulmonary:     Effort: Pulmonary effort is normal. No respiratory distress.     Breath sounds: No wheezing or rales.  Abdominal:     General: There is no distension.  Musculoskeletal:     Right lower leg: No edema.     Left lower leg: No edema.  Skin:    General: Skin is warm and dry.         Future Appointments  Date Time Provider [place]  03/03/2019  7:20 AM [facility]  06/02/2019  7:20 AM [facility]  09/01/2019  7:20 AM [facility]  12/01/2019  7:20 AM [facility]  03/01/2020  7:20 AM [facility]     BP 140/80 (BP Location:  Right Arm, Patient Position: Sitting)   Pulse 67   Temp 98.4 F (36.9 C)   Wt 199 lb (90.3 kg)   BMI 34.16 kg/m   Weight yesterday-199 Last visit weight-195  ATF pt CAO x4 watching tv with no complaints.  She has taken her medication this morning.  She denies sob, chest pain  and dizziness. The CPAP still doesn't fit and she's sleeping most of the night without it.  rx bottles verified and pill box refilled. She has several medications need refills.     Medication ordered: Ferrous sulfate Torsemide (need dr)  losartan ( too early) Spirolactone Isosorbide jardiance Carvedilol Atorvastatin (need dr) Arne [place] Potassium (need dr)  Nat Math Amana Bouska, EMT Paramedic [phone] 02/24/2019    ACTION: Home visit completed

## 2019-02-24 ENCOUNTER — Other Ambulatory Visit (HOSPITAL_COMMUNITY): Payer: Self-pay

## 2019-02-24 MED ORDER — TORSEMIDE 20 MG PO TABS: 20.0000 mg | ORAL_TABLET | Freq: Every day | ORAL | 3 refills | Status: DC

## 2019-02-26 ENCOUNTER — Other Ambulatory Visit (HOSPITAL_COMMUNITY): Payer: Self-pay

## 2019-02-26 NOTE — Progress Notes (Signed)
Paramedicine Encounter    Patient ID: Danielle [name], female    DOB: 30-Aug-1945, 73 y.o.   MRN: [id number]    Patient Care Team: Seward Carol, MD as PCP - General (Internal Medicine) Jorge Ny, LCSW as Social Worker (Licensed Clinical Social Worker)  Patient Active Problem List   Diagnosis Date Noted  . Encephalopathy acute 05/20/2018  . 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 Acute kidney failure ([place]) 05/15/2018  . Primary localized osteoarthritis of right knee 05/13/2018  . (HFpEF) heart failure with preserved ejection fraction ([place]) 12/20/2017  . Obesity (BMI 30-39.9) 11/11/2017  . OSA (obstructive sleep apnea) 02/18/2017  . Essential hypertension   . Diabetes mellitus without complication ([place])   . Cancer ([place])   . Thyroid disease     Current Outpatient Medications:  .  allopurinol (ZYLOPRIM) 100 MG tablet, Take 1 tablet (100 mg total) by mouth daily., Disp: , Rfl:  .  apixaban (ELIQUIS) 5 MG TABS tablet, Take 1 tablet (5 mg total) by mouth 2 (two) times daily., Disp: 180 tablet, Rfl: 3 .  atorvastatin (LIPITOR) 80 MG tablet, Take 1 tablet (80 mg total) by mouth at bedtime., Disp: 30 tablet, Rfl: 5 .  calcium citrate (CALCITRATE - DOSED IN MG ELEMENTAL CALCIUM) 950 MG tablet, Take 1 tablet by mouth 2 (two) times daily. , Disp: , Rfl:  .  carvedilol (COREG) 25 MG tablet, Take 1 tablet (25 mg total) by mouth 2 (two) times daily with a meal., Disp: 60 tablet, Rfl: 3 .  ferrous sulfate 325 (65 FE) MG tablet, Take 325 mg by mouth 2 (two) times daily with a meal., Disp: , Rfl:  .  gabapentin (NEURONTIN) 300 MG capsule, Take 300 mg by mouth daily. , Disp: , Rfl:  .  isosorbide mononitrate (IMDUR) 30 MG 24 hr tablet, Take 1 tablet by mouth once daily, Disp: 30 tablet, Rfl: 0 .  JARDIANCE 10 MG TABS tablet, Take 1 tablet by mouth once daily, Disp: 90 tablet, Rfl: 0 .  levothyroxine (SYNTHROID, LEVOTHROID) 75 MCG tablet, Take 75 mcg by mouth daily  before breakfast., Disp: , Rfl:  .  losartan (COZAAR) 50 MG tablet, Take 1 tablet (50 mg total) by mouth at bedtime., Disp: 30 tablet, Rfl: 6 .  omeprazole (PRILOSEC) 20 MG capsule, Take 20 mg by mouth daily. , Disp: , Rfl:  .  potassium chloride (K-DUR) 10 MEQ tablet, Take 2 tablets by mouth once daily, Disp: 60 tablet, Rfl: 0 .  spironolactone (ALDACTONE) 25 MG tablet, Take 1 tablet (25 mg total) by mouth every other day., Disp: 30 tablet, Rfl: 5 .  torsemide (DEMADEX) 20 MG tablet, Take 1 tablet (20 mg total) by mouth daily., Disp: 30 tablet, Rfl: 3 .  acetaminophen (TYLENOL) 500 MG tablet, Take 500-1,000 mg by mouth every 6 (six) hours as needed (for pain or headaches)., Disp: , Rfl:  .  Biotin 10 MG CAPS, Take 10 mg by mouth daily., Disp: , Rfl:  .  CHONDROITIN SULFATE PO, Take 250 mg by mouth daily., Disp: , Rfl:  .  insulin glargine (LANTUS) 100 UNIT/ML injection, Inject 20 Units into the skin 2 (two) times daily after a meal. , Disp: , Rfl:  .  Multiple Vitamins-Minerals (ALIVE ONCE DAILY WOMENS 50+ PO), Take 1 tablet by mouth daily., Disp: , Rfl:  .  nitroGLYCERIN (NITROSTAT) 0.4 MG SL tablet, DISSOLVE ONE TABLET UNDER THE TONGUE EVERY 5 MINUTES AS NEEDED FOR CHEST PAIN.,  Disp: 25 tablet, Rfl: 3 .  Travoprost, BAK Free, (TRAVATAN) 0.004 % SOLN ophthalmic solution, Place 1 drop into both eyes at bedtime. , Disp: , Rfl:  Allergies  Allergen Reactions  . Other Other (See Comments)    Blood products- Patient won't accept any  . Sulfa Antibiotics Rash  . Uloric [Febuxostat] Rash     Social History   Socioeconomic History  . Marital status: Widowed    Spouse name: Not on file  . Number of children: 3  . Years of education: Not on file  . Highest education level: Some college, no degree  Occupational History  . Occupation: retired  Scientific laboratory technician  . Financial resource strain: Not hard at all  . Food insecurity    Worry: Never true    Inability: Never true  . Transportation needs     Medical: No    Non-medical: No  Tobacco Use  . Smoking status: Former Smoker    Packs/day: 1.00    Years: 22.00    Pack years: 22.00    Types: Cigarettes    Quit date: 1983    Years since quitting: 37.7  . Smokeless tobacco: Never Used  Substance and Sexual Activity  . Alcohol use: Yes    Comment: 05/22/2018 "glass of wine couple times/wk"  . Drug use: Not Currently  . Sexual activity: Not Currently    Birth control/protection: None, Post-menopausal  Lifestyle  . Physical activity    Days per week: Not on file    Minutes per session: Not on file  . Stress: Not on file  Relationships  . Social Herbalist on phone: Not on file    Gets together: Not on file    Attends religious service: Not on file    Active member of club or organization: Not on file    Attends meetings of clubs or organizations: Not on file    Relationship status: Not on file  . Intimate partner violence    Fear of current or ex partner: Not on file    Emotionally abused: Not on file    Physically abused: Not on file    Forced sexual activity: Not on file  Other Topics Concern  . Not on file  Social History Narrative  . Not on file    Physical Exam Pulmonary:     Effort: Pulmonary effort is normal. No respiratory distress.     Breath sounds: No wheezing or rales.  Abdominal:     General: There is no distension.  Musculoskeletal:     Right lower leg: No edema.     Left lower leg: No edema.  Skin:    General: Skin is warm and dry.         Future Appointments  Date Time Provider [place]  03/03/2019  7:20 AM [facility]  06/02/2019  7:20 AM [facility]  09/01/2019  7:20 AM [facility]  12/01/2019  7:20 AM [facility]  03/01/2020  7:20 AM [facility]     BP 104/68 (BP  Location: Right Arm, Patient Position: Sitting, Cuff Size: Large)   Pulse 64   Temp 98 F (36.7 C)   Wt 194 lb 9.6 oz (88.3 kg)   SpO2 98%   BMI 33.40 kg/m   Weight yesterday-didn't weigh Last visit weight-199  ATF pt CAO x4 sitting in the living room watching tv, c/o of  feeling tired.  She said that she didn't sleep well last night; she stated that she was awake cleaning at 2am.  She denies chest pain, dizziness and sob.  Mrs. Fleites denies feeling anxious about anything; no other complaints noted. She's taken all of her medications for the week.  rx bottles verified and pill box refilled.    Medication ordered: none Gevon Markus, EMT Paramedic [phone] 02/26/2019    ACTION: Home visit completed

## 2019-02-27 ENCOUNTER — Telehealth: Payer: Self-pay

## 2019-02-27 NOTE — Telephone Encounter (Signed)
Pt states she do not know how to send a manual transmission. I verbally explained to her how to send the transmission. I told her if she needs help sending on her appointment time to call me on my direct office number and I will help her with it. The pt thanked me for the help.

## 2019-03-03 ENCOUNTER — Other Ambulatory Visit: Payer: Self-pay | Admitting: Internal Medicine

## 2019-03-03 ENCOUNTER — Ambulatory Visit (INDEPENDENT_AMBULATORY_CARE_PROVIDER_SITE_OTHER): Payer: Medicare HMO | Admitting: *Deleted

## 2019-03-03 DIAGNOSIS — H9209 Otalgia, unspecified ear: Secondary | ICD-10-CM | POA: Diagnosis not present

## 2019-03-03 DIAGNOSIS — R001 Bradycardia, unspecified: Secondary | ICD-10-CM | POA: Diagnosis not present

## 2019-03-03 DIAGNOSIS — M542 Cervicalgia: Secondary | ICD-10-CM

## 2019-03-03 DIAGNOSIS — Z23 Encounter for immunization: Secondary | ICD-10-CM | POA: Diagnosis not present

## 2019-03-03 DIAGNOSIS — R221 Localized swelling, mass and lump, neck: Secondary | ICD-10-CM | POA: Diagnosis not present

## 2019-03-04 ENCOUNTER — Other Ambulatory Visit (HOSPITAL_COMMUNITY): Payer: Self-pay

## 2019-03-04 DIAGNOSIS — Z6837 Body mass index (BMI) 37.0-37.9, adult: Secondary | ICD-10-CM | POA: Diagnosis not present

## 2019-03-04 DIAGNOSIS — E118 Type 2 diabetes mellitus with unspecified complications: Secondary | ICD-10-CM | POA: Diagnosis not present

## 2019-03-04 DIAGNOSIS — M1A09X1 Idiopathic chronic gout, multiple sites, with tophus (tophi): Secondary | ICD-10-CM | POA: Diagnosis not present

## 2019-03-04 DIAGNOSIS — M2550 Pain in unspecified joint: Secondary | ICD-10-CM | POA: Diagnosis not present

## 2019-03-04 DIAGNOSIS — E669 Obesity, unspecified: Secondary | ICD-10-CM | POA: Diagnosis not present

## 2019-03-04 LAB — CUP PACEART REMOTE DEVICE CHECK
Battery Remaining Longevity: 80 mo
Battery Voltage: 3.01 V
Brady Statistic AP VP Percent: 0.02 %
Brady Statistic AP VS Percent: 13.06 %
Brady Statistic AS VP Percent: 0.06 %
Brady Statistic AS VS Percent: 86.86 %
Brady Statistic RA Percent Paced: 12.94 %
Brady Statistic RV Percent Paced: 0.09 %
Date Time Interrogation Session: 20201006135245
Implantable Lead Implant Date: 20160222
Implantable Lead Implant Date: 20160222
Implantable Lead Location: 753859
Implantable Lead Location: 753860
Implantable Lead Model: 5076
Implantable Lead Model: 5076
Implantable Pulse Generator Implant Date: 20160222
Lead Channel Impedance Value: 399 Ohm
Lead Channel Impedance Value: 418 Ohm
Lead Channel Impedance Value: 418 Ohm
Lead Channel Impedance Value: 494 Ohm
Lead Channel Pacing Threshold Amplitude: 0.625 V
Lead Channel Pacing Threshold Amplitude: 0.75 V
Lead Channel Pacing Threshold Pulse Width: 0.4 ms
Lead Channel Pacing Threshold Pulse Width: 0.4 ms
Lead Channel Sensing Intrinsic Amplitude: 18.125 mV
Lead Channel Sensing Intrinsic Amplitude: 18.125 mV
Lead Channel Sensing Intrinsic Amplitude: 3.875 mV
Lead Channel Sensing Intrinsic Amplitude: 3.875 mV
Lead Channel Setting Pacing Amplitude: 2 V
Lead Channel Setting Pacing Amplitude: 2 V
Lead Channel Setting Pacing Pulse Width: 0.4 ms
Lead Channel Setting Sensing Sensitivity: 2 mV

## 2019-03-04 NOTE — Progress Notes (Signed)
Paramedicine Encounter    Patient ID: Danielle [name], female    DOB: [birth year], 73 y.o.   MRN: [id number]    Patient Care Team: Seward Carol, MD as PCP - General (Internal Medicine) Jorge Ny, LCSW as Social Worker (Licensed Clinical Social Worker)  Patient Active Problem List   Diagnosis Date Noted  . Encephalopathy acute 05/20/2018  . 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 Acute kidney failure ([place]) 05/15/2018  . Primary localized osteoarthritis of right knee 05/13/2018  . (HFpEF) heart failure with preserved ejection fraction ([place]) 12/20/2017  . Obesity (BMI 30-39.9) 11/11/2017  . OSA (obstructive sleep apnea) 02/18/2017  . Essential hypertension   . Diabetes mellitus without complication ([place])   . Cancer ([place])   . Thyroid disease     Current Outpatient Medications:  .  allopurinol (ZYLOPRIM) 100 MG tablet, Take 1 tablet (100 mg total) by mouth daily., Disp: , Rfl:  .  apixaban (ELIQUIS) 5 MG TABS tablet, Take 1 tablet (5 mg total) by mouth 2 (two) times daily., Disp: 180 tablet, Rfl: 3 .  atorvastatin (LIPITOR) 80 MG tablet, Take 1 tablet (80 mg total) by mouth at bedtime., Disp: 30 tablet, Rfl: 5 .  calcium citrate (CALCITRATE - DOSED IN MG ELEMENTAL CALCIUM) 950 MG tablet, Take 1 tablet by mouth 2 (two) times daily. , Disp: , Rfl:  .  carvedilol (COREG) 25 MG tablet, Take 1 tablet (25 mg total) by mouth 2 (two) times daily with a meal., Disp: 60 tablet, Rfl: 3 .  ferrous sulfate 325 (65 FE) MG tablet, Take 325 mg by mouth 2 (two) times daily with a meal., Disp: , Rfl:  .  gabapentin (NEURONTIN) 300 MG capsule, Take 300 mg by mouth daily. , Disp: , Rfl:  .  isosorbide mononitrate (IMDUR) 30 MG 24 hr tablet, Take 1 tablet by mouth once daily, Disp: 30 tablet, Rfl: 0 .  JARDIANCE 10 MG TABS tablet, Take 1 tablet by mouth once daily, Disp: 90 tablet, Rfl: 0 .  levothyroxine (SYNTHROID, LEVOTHROID) 75 MCG tablet, Take 75 mcg by mouth daily  before breakfast., Disp: , Rfl:  .  losartan (COZAAR) 50 MG tablet, Take 1 tablet (50 mg total) by mouth at bedtime., Disp: 30 tablet, Rfl: 6 .  omeprazole (PRILOSEC) 20 MG capsule, Take 20 mg by mouth daily. , Disp: , Rfl:  .  potassium chloride (K-DUR) 10 MEQ tablet, Take 2 tablets by mouth once daily, Disp: 60 tablet, Rfl: 0 .  spironolactone (ALDACTONE) 25 MG tablet, Take 1 tablet (25 mg total) by mouth every other day., Disp: 30 tablet, Rfl: 5 .  torsemide (DEMADEX) 20 MG tablet, Take 1 tablet (20 mg total) by mouth daily., Disp: 30 tablet, Rfl: 3 .  acetaminophen (TYLENOL) 500 MG tablet, Take 500-1,000 mg by mouth every 6 (six) hours as needed (for pain or headaches)., Disp: , Rfl:  .  Biotin 10 MG CAPS, Take 10 mg by mouth daily., Disp: , Rfl:  .  CHONDROITIN SULFATE PO, Take 250 mg by mouth daily., Disp: , Rfl:  .  insulin glargine (LANTUS) 100 UNIT/ML injection, Inject 20 Units into the skin 2 (two) times daily after a meal. , Disp: , Rfl:  .  Multiple Vitamins-Minerals (ALIVE ONCE DAILY WOMENS 50+ PO), Take 1 tablet by mouth daily., Disp: , Rfl:  .  nitroGLYCERIN (NITROSTAT) 0.4 MG SL tablet, DISSOLVE ONE TABLET UNDER THE TONGUE EVERY 5 MINUTES AS NEEDED FOR CHEST PAIN.,  Disp: 25 tablet, Rfl: 3 .  Travoprost, BAK Free, (TRAVATAN) 0.004 % SOLN ophthalmic solution, Place 1 drop into both eyes at bedtime. , Disp: , Rfl:  Allergies  Allergen Reactions  . Other Other (See Comments)    Blood products- Patient won't accept any  . Sulfa Antibiotics Rash  . Uloric [Febuxostat] Rash     Social History   Socioeconomic History  . Marital status: Widowed    Spouse name: Not on file  . Number of children: 3  . Years of education: Not on file  . Highest education level: Some college, no degree  Occupational History  . Occupation: retired  Scientific laboratory technician  . Financial resource strain: Not hard at all  . Food insecurity    Worry: Never true    Inability: Never true  . Transportation needs     Medical: No    Non-medical: No  Tobacco Use  . Smoking status: Former Smoker    Packs/day: 1.00    Years: 22.00    Pack years: 22.00    Types: Cigarettes    Quit date: 1983    Years since quitting: 37.7  . Smokeless tobacco: Never Used  Substance and Sexual Activity  . Alcohol use: Yes    Comment: 05/22/2018 "glass of wine couple times/wk"  . Drug use: Not Currently  . Sexual activity: Not Currently    Birth control/protection: None, Post-menopausal  Lifestyle  . Physical activity    Days per week: Not on file    Minutes per session: Not on file  . Stress: Not on file  Relationships  . Social Herbalist on phone: Not on file    Gets together: Not on file    Attends religious service: Not on file    Active member of club or organization: Not on file    Attends meetings of clubs or organizations: Not on file    Relationship status: Not on file  . Intimate partner violence    Fear of current or ex partner: Not on file    Emotionally abused: Not on file    Physically abused: Not on file    Forced sexual activity: Not on file  Other Topics Concern  . Not on file  Social History Narrative  . Not on file    Physical Exam      Future Appointments  Date Time Provider [place]  03/09/2019  9:15 AM [facility] [place] [facility]  06/02/2019  7:20 AM [facility] DEVICE [facility]OTES [facility]  09/01/2019  7:20 AM [facility]  12/01/2019  7:20 AM [facility]  03/01/2020  7:20 AM [facility]     BP 104/80 (BP Location: Right Arm, Patient Position: Sitting, Cuff Size: Large)   Pulse 66   Temp 97.8 F (36.6 C)   Resp 15   SpO2 98%   Weight yesterday-didn't weigh Last visit weight-194  ATF pt CAO x4. She said that she had an appointment earlier today with the "ear doctor", which went well. She said that she still feels  unsteady on her feet and she thinks that her ear is infected.  She denies sob, chest pain and dizziness.  She taken all of her medications for this week.  She said that she weighed 216 lb at the doctors office today, but think that the values is incorrect.  rx bottles verified and pill box refilled.    Medication ordered: Losartan  Jaemarie Hochberg, EMT Paramedic [phone] 03/04/2019    ACTION: Home visit completed

## 2019-03-09 ENCOUNTER — Ambulatory Visit
Admission: RE | Admit: 2019-03-09 | Discharge: 2019-03-09 | Disposition: A | Discharge status: Home / Self Care | Payer: Medicare HMO | Source: Ambulatory Visit | Attending: Internal Medicine | Admitting: Internal Medicine

## 2019-03-09 DIAGNOSIS — M542 Cervicalgia: Secondary | ICD-10-CM

## 2019-03-11 NOTE — Progress Notes (Signed)
Remote pacemaker transmission.   

## 2019-03-12 ENCOUNTER — Other Ambulatory Visit (HOSPITAL_COMMUNITY): Payer: Self-pay

## 2019-03-12 NOTE — Progress Notes (Signed)
Paramedicine Encounter    Patient ID: Danielle [name], female    DOB: [birth year], 73 y.o.   MRN: [id number]    Patient Care Team: Seward Carol, MD as PCP - General (Internal Medicine) Jorge Ny, LCSW as Social Worker (Licensed Clinical Social Worker)  Patient Active Problem List   Diagnosis Date Noted  . Encephalopathy acute 05/20/2018  . 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 Acute kidney failure ([place]) 05/15/2018  . Primary localized osteoarthritis of right knee 05/13/2018  . (HFpEF) heart failure with preserved ejection fraction ([place]) 12/20/2017  . Obesity (BMI 30-39.9) 11/11/2017  . OSA (obstructive sleep apnea) 02/18/2017  . Essential hypertension   . Diabetes mellitus without complication ([place])   . Cancer ([place])   . Thyroid disease     Current Outpatient Medications:  .  allopurinol (ZYLOPRIM) 100 MG tablet, Take 1 tablet (100 mg total) by mouth daily., Disp: , Rfl:  .  apixaban (ELIQUIS) 5 MG TABS tablet, Take 1 tablet (5 mg total) by mouth 2 (two) times daily., Disp: 180 tablet, Rfl: 3 .  atorvastatin (LIPITOR) 80 MG tablet, Take 1 tablet (80 mg total) by mouth at bedtime., Disp: 30 tablet, Rfl: 5 .  calcium citrate (CALCITRATE - DOSED IN MG ELEMENTAL CALCIUM) 950 MG tablet, Take 1 tablet by mouth 2 (two) times daily. , Disp: , Rfl:  .  carvedilol (COREG) 25 MG tablet, Take 1 tablet (25 mg total) by mouth 2 (two) times daily with a meal., Disp: 60 tablet, Rfl: 3 .  ferrous sulfate 325 (65 FE) MG tablet, Take 325 mg by mouth 2 (two) times daily with a meal., Disp: , Rfl:  .  gabapentin (NEURONTIN) 300 MG capsule, Take 300 mg by mouth daily. , Disp: , Rfl:  .  isosorbide mononitrate (IMDUR) 30 MG 24 hr tablet, Take 1 tablet by mouth once daily, Disp: 30 tablet, Rfl: 0 .  JARDIANCE 10 MG TABS tablet, Take 1 tablet by mouth once daily, Disp: 90 tablet, Rfl: 0 .  levothyroxine (SYNTHROID, LEVOTHROID) 75 MCG tablet, Take 75 mcg by mouth daily  before breakfast., Disp: , Rfl:  .  losartan (COZAAR) 50 MG tablet, Take 1 tablet (50 mg total) by mouth at bedtime., Disp: 30 tablet, Rfl: 6 .  Multiple Vitamins-Minerals (ALIVE ONCE DAILY WOMENS 50+ PO), Take 1 tablet by mouth daily., Disp: , Rfl:  .  omeprazole (PRILOSEC) 20 MG capsule, Take 20 mg by mouth daily. , Disp: , Rfl:  .  potassium chloride (K-DUR) 10 MEQ tablet, Take 2 tablets by mouth once daily, Disp: 60 tablet, Rfl: 0 .  spironolactone (ALDACTONE) 25 MG tablet, Take 1 tablet (25 mg total) by mouth every other day., Disp: 30 tablet, Rfl: 5 .  torsemide (DEMADEX) 20 MG tablet, Take 1 tablet (20 mg total) by mouth daily., Disp: 30 tablet, Rfl: 3 .  acetaminophen (TYLENOL) 500 MG tablet, Take 500-1,000 mg by mouth every 6 (six) hours as needed (for pain or headaches)., Disp: , Rfl:  .  Biotin 10 MG CAPS, Take 10 mg by mouth daily., Disp: , Rfl:  .  CHONDROITIN SULFATE PO, Take 250 mg by mouth daily., Disp: , Rfl:  .  insulin glargine (LANTUS) 100 UNIT/ML injection, Inject 20 Units into the skin 2 (two) times daily after a meal. , Disp: , Rfl:  .  nitroGLYCERIN (NITROSTAT) 0.4 MG SL tablet, DISSOLVE ONE TABLET UNDER THE TONGUE EVERY 5 MINUTES AS NEEDED FOR CHEST PAIN.,  Disp: 25 tablet, Rfl: 3 .  Travoprost, BAK Free, (TRAVATAN) 0.004 % SOLN ophthalmic solution, Place 1 drop into both eyes at bedtime. , Disp: , Rfl:  Allergies  Allergen Reactions  . Other Other (See Comments)    Blood products- Patient won't accept any  . Sulfa Antibiotics Rash  . Uloric [Febuxostat] Rash     Social History   Socioeconomic History  . Marital status: Widowed    Spouse name: Not on file  . Number of children: 3  . Years of education: Not on file  . Highest education level: Some college, no degree  Occupational History  . Occupation: retired  Scientific laboratory technician  . Financial resource strain: Not hard at all  . Food insecurity    Worry: Never true    Inability: Never true  . Transportation needs     Medical: No    Non-medical: No  Tobacco Use  . Smoking status: Former Smoker    Packs/day: 1.00    Years: 22.00    Pack years: 22.00    Types: Cigarettes    Quit date: 1983    Years since quitting: 37.8  . Smokeless tobacco: Never Used  Substance and Sexual Activity  . Alcohol use: Yes    Comment: 05/22/2018 "glass of wine couple times/wk"  . Drug use: Not Currently  . Sexual activity: Not Currently    Birth control/protection: None, Post-menopausal  Lifestyle  . Physical activity    Days per week: Not on file    Minutes per session: Not on file  . Stress: Not on file  Relationships  . Social Herbalist on phone: Not on file    Gets together: Not on file    Attends religious service: Not on file    Active member of club or organization: Not on file    Attends meetings of clubs or organizations: Not on file    Relationship status: Not on file  . Intimate partner violence    Fear of current or ex partner: Not on file    Emotionally abused: Not on file    Physically abused: Not on file    Forced sexual activity: Not on file  Other Topics Concern  . Not on file  Social History Narrative  . Not on file    Physical Exam Pulmonary:     Effort: No respiratory distress.     Breath sounds: No wheezing or rales.  Abdominal:     Tenderness: There is no abdominal tenderness. There is no guarding.  Musculoskeletal:        General: No swelling.     Right lower leg: No edema.     Left lower leg: No edema.  Skin:    General: Skin is warm and dry.         Future Appointments  Date Time Provider [place]  06/02/2019  7:20 AM [facility]  09/01/2019  7:20 AM [facility]  12/01/2019  7:20 AM [facility]  03/01/2020  7:20 AM [facility]     BP 110/68 (BP Location: Right Arm, Patient Position:  Sitting, Cuff Size: Normal)   Pulse 68   Temp 98.8 F (37.1 C)   Wt 200 lb (90.7 kg)   SpO2 98%   BMI 34.33 kg/m  cbg 250  Weight yesterday-she can't remember Last visit weight-194 (10/1)  ATF pt CAO x4 sitting in the living room. She  stated that she had just come in from eating lunch at [employer]. She's taken all of her medications this week.  She has no complaints; no sob, no chest pain and no dizziness.  She's eaten a lot of food high in sodium the entire weekend into yesterday.  We discussed her food choices.  rx bottles verified and pill box refilled.    Medication ordered: losartatin Omeprazole  Wyllow Seigler, EMT Paramedic [phone] 03/13/2019    ACTION: Home visit completed

## 2019-03-17 DIAGNOSIS — Z853 Personal history of malignant neoplasm of breast: Secondary | ICD-10-CM | POA: Diagnosis not present

## 2019-03-17 DIAGNOSIS — R102 Pelvic and perineal pain: Secondary | ICD-10-CM | POA: Diagnosis not present

## 2019-03-17 DIAGNOSIS — Z01419 Encounter for gynecological examination (general) (routine) without abnormal findings: Secondary | ICD-10-CM | POA: Diagnosis not present

## 2019-03-17 DIAGNOSIS — K5900 Constipation, unspecified: Secondary | ICD-10-CM | POA: Diagnosis not present

## 2019-03-17 DIAGNOSIS — N898 Other specified noninflammatory disorders of vagina: Secondary | ICD-10-CM | POA: Diagnosis not present

## 2019-03-19 ENCOUNTER — Other Ambulatory Visit (HOSPITAL_COMMUNITY): Payer: Self-pay

## 2019-03-19 ENCOUNTER — Telehealth (HOSPITAL_COMMUNITY): Payer: Self-pay | Admitting: *Deleted

## 2019-03-19 DIAGNOSIS — I5022 Chronic systolic (congestive) heart failure: Secondary | ICD-10-CM

## 2019-03-19 MED ORDER — TORSEMIDE 20 MG PO TABS: 40.0000 mg | ORAL_TABLET | Freq: Every day | ORAL | 3 refills | Status: DC

## 2019-03-19 NOTE — Progress Notes (Signed)
Paramedicine Encounter    Patient ID: Danielle [name], female    DOB: 02/08/1946, 73 y.o.   MRN: [id number]    Patient Care Team: Seward Carol, MD as PCP - General (Internal Medicine) Jorge Ny, LCSW as Social Worker (Licensed Clinical Social Worker)  Patient Active Problem List   Diagnosis Date Noted  . Encephalopathy acute 05/20/2018  . 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 Acute kidney failure ([place]) 05/15/2018  . Primary localized osteoarthritis of right knee 05/13/2018  . (HFpEF) heart failure with preserved ejection fraction ([place]) 12/20/2017  . Obesity (BMI 30-39.9) 11/11/2017  . OSA (obstructive sleep apnea) 02/18/2017  . Essential hypertension   . Diabetes mellitus without complication ([place])   . Cancer ([place])   . Thyroid disease     Current Outpatient Medications:  .  allopurinol (ZYLOPRIM) 100 MG tablet, Take 1 tablet (100 mg total) by mouth daily., Disp: , Rfl:  .  apixaban (ELIQUIS) 5 MG TABS tablet, Take 1 tablet (5 mg total) by mouth 2 (two) times daily., Disp: 180 tablet, Rfl: 3 .  atorvastatin (LIPITOR) 80 MG tablet, Take 1 tablet (80 mg total) by mouth at bedtime., Disp: 30 tablet, Rfl: 5 .  calcium citrate (CALCITRATE - DOSED IN MG ELEMENTAL CALCIUM) 950 MG tablet, Take 1 tablet by mouth 2 (two) times daily. , Disp: , Rfl:  .  carvedilol (COREG) 25 MG tablet, Take 1 tablet (25 mg total) by mouth 2 (two) times daily with a meal., Disp: 60 tablet, Rfl: 3 .  ferrous sulfate 325 (65 FE) MG tablet, Take 325 mg by mouth 2 (two) times daily with a meal., Disp: , Rfl:  .  gabapentin (NEURONTIN) 300 MG capsule, Take 300 mg by mouth daily. , Disp: , Rfl:  .  isosorbide mononitrate (IMDUR) 30 MG 24 hr tablet, Take 1 tablet by mouth once daily, Disp: 30 tablet, Rfl: 0 .  JARDIANCE 10 MG TABS tablet, Take 1 tablet by mouth once daily, Disp: 90 tablet, Rfl: 0 .  levothyroxine (SYNTHROID, LEVOTHROID) 75 MCG tablet, Take 75 mcg by mouth daily  before breakfast., Disp: , Rfl:  .  Multiple Vitamins-Minerals (ALIVE ONCE DAILY WOMENS 50+ PO), Take 1 tablet by mouth daily., Disp: , Rfl:  .  omeprazole (PRILOSEC) 20 MG capsule, Take 20 mg by mouth daily. , Disp: , Rfl:  .  potassium chloride (K-DUR) 10 MEQ tablet, Take 2 tablets by mouth once daily, Disp: 60 tablet, Rfl: 0 .  spironolactone (ALDACTONE) 25 MG tablet, Take 1 tablet (25 mg total) by mouth every other day., Disp: 30 tablet, Rfl: 5 .  acetaminophen (TYLENOL) 500 MG tablet, Take 500-1,000 mg by mouth every 6 (six) hours as needed (for pain or headaches)., Disp: , Rfl:  .  Biotin 10 MG CAPS, Take 10 mg by mouth daily., Disp: , Rfl:  .  CHONDROITIN SULFATE PO, Take 250 mg by mouth daily., Disp: , Rfl:  .  insulin glargine (LANTUS) 100 UNIT/ML injection, Inject 20 Units into the skin 2 (two) times daily after a meal. , Disp: , Rfl:  .  losartan (COZAAR) 50 MG tablet, Take 1 tablet (50 mg total) by mouth at bedtime., Disp: 30 tablet, Rfl: 6 .  nitroGLYCERIN (NITROSTAT) 0.4 MG SL tablet, DISSOLVE ONE TABLET UNDER THE TONGUE EVERY 5 MINUTES AS NEEDED FOR CHEST PAIN., Disp: 25 tablet, Rfl: 3 .  torsemide (DEMADEX) 20 MG tablet, Take 2 tablets (40 mg total) by mouth daily.,  Disp: 60 tablet, Rfl: 3 .  Travoprost, BAK Free, (TRAVATAN) 0.004 % SOLN ophthalmic solution, Place 1 drop into both eyes at bedtime. , Disp: , Rfl:  Allergies  Allergen Reactions  . Other Other (See Comments)    Blood products- Patient won't accept any  . Sulfa Antibiotics Rash  . Uloric [Febuxostat] Rash     Social History   Socioeconomic History  . Marital status: Widowed    Spouse name: Not on file  . Number of children: 3  . Years of education: Not on file  . Highest education level: Some college, no degree  Occupational History  . Occupation: retired  Scientific laboratory technician  . Financial resource strain: Not hard at all  . Food insecurity    Worry: Never true    Inability: Never true  . Transportation needs     Medical: No    Non-medical: No  Tobacco Use  . Smoking status: Former Smoker    Packs/day: 1.00    Years: 22.00    Pack years: 22.00    Types: Cigarettes    Quit date: 1983    Years since quitting: 37.8  . Smokeless tobacco: Never Used  Substance and Sexual Activity  . Alcohol use: Yes    Comment: 05/22/2018 "glass of wine couple times/wk"  . Drug use: Not Currently  . Sexual activity: Not Currently    Birth control/protection: None, Post-menopausal  Lifestyle  . Physical activity    Days per week: Not on file    Minutes per session: Not on file  . Stress: Not on file  Relationships  . Social Herbalist on phone: Not on file    Gets together: Not on file    Attends religious service: Not on file    Active member of club or organization: Not on file    Attends meetings of clubs or organizations: Not on file    Relationship status: Not on file  . Intimate partner violence    Fear of current or ex partner: Not on file    Emotionally abused: Not on file    Physically abused: Not on file    Forced sexual activity: Not on file  Other Topics Concern  . Not on file  Social History Narrative  . Not on file    Physical Exam Pulmonary:     Effort: Pulmonary effort is normal. No respiratory distress.     Breath sounds: No wheezing.  Musculoskeletal:     Right lower leg: Edema present.     Left lower leg: Edema present.  Skin:    General: Skin is warm and dry.         Future Appointments  Date Time Provider [place]  03/27/2019  9:00 AM [facility] None  06/02/2019  7:20 AM [facility]  09/01/2019  7:20 AM [facility]  12/01/2019  7:20 AM [facility]  03/01/2020  7:20 AM [facility]     BP 138/80 (BP Location: Right Arm, Patient Position: Sitting, Cuff Size: Normal)   Pulse 88    Temp 98.5 F (36.9 C)   Wt 213 lb (96.6 kg)   SpO2 99%   BMI 36.56 kg/m  CBG 210  Weight yesterday-212 Last visit weight-200  ATF pt CAO x4 sitting in the living room with no complaints.  She said that she had an appointment with her OB-GYN today; no medication changes made  during that visit.  She has taken her medications for this week.  She denies sob, chest pain and dizziness.  Her weight has increased 13 lbs in a week; I verified her weight today.  Pt ate foods high in sodium last week, which included foods like stromboli, a sub, salad, bacon for breakfast, fried chicken etc.  She said that none of those foods had sodium in them and she doesn't understand why her weight is up; she's very frustrated.    [name] increased torsemide to 60 mg for two days and 40 mg daily until she's re-evaluated [facility].  I refilled her pill box according to those recommendations.  Pt has an appointment schedule 10/30.  Medication ordered: Potassium Isosorbide Gabapentin eliquis  [place], EMT Paramedic [phone] 03/20/2019    ACTION: Home visit completed

## 2019-03-19 NOTE — Telephone Encounter (Signed)
Dee called to report pt's wt is up 13 lbs since last week.  She states pt is taking Torsemide 20 mg daily as directed.  Pt admits to eating 2 stomboli's last week, along with fried chicken.  Dee discussed Na restrictions again with pt.  Discussed all above w/[name] Clegg, NP, she advised pt to increase Torsemide to 60 mg daily for 2 days then take 40 mg daily as new dose, get bmet in 1 week.  Karena Addison is aware and will adjust pill box, bmet sch for Fri 10/30, new rx sent into pharmacy

## 2019-03-26 ENCOUNTER — Other Ambulatory Visit (HOSPITAL_COMMUNITY): Payer: Self-pay

## 2019-03-26 ENCOUNTER — Other Ambulatory Visit (HOSPITAL_COMMUNITY): Payer: Self-pay | Admitting: Internal Medicine

## 2019-03-26 NOTE — Progress Notes (Signed)
Paramedicine Encounter    Patient ID: Danielle [name], female    DOB: 07/31/1945, 73 y.o.   MRN: [id number]    Patient Care Team: Seward Carol, MD as PCP - General (Internal Medicine) Jorge Ny, LCSW as Social Worker (Licensed Clinical Social Worker)  Patient Active Problem List   Diagnosis Date Noted  . Encephalopathy acute 05/20/2018  . 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 Acute kidney failure ([place]) 05/15/2018  . Primary localized osteoarthritis of right knee 05/13/2018  . (HFpEF) heart failure with preserved ejection fraction ([place]) 12/20/2017  . Obesity (BMI 30-39.9) 11/11/2017  . OSA (obstructive sleep apnea) 02/18/2017  . Essential hypertension   . Diabetes mellitus without complication ([place])   . Cancer ([place])   . Thyroid disease     Current Outpatient Medications:  .  allopurinol (ZYLOPRIM) 100 MG tablet, Take 1 tablet (100 mg total) by mouth daily., Disp: , Rfl:  .  apixaban (ELIQUIS) 5 MG TABS tablet, Take 1 tablet (5 mg total) by mouth 2 (two) times daily., Disp: 180 tablet, Rfl: 3 .  atorvastatin (LIPITOR) 80 MG tablet, Take 1 tablet (80 mg total) by mouth at bedtime., Disp: 30 tablet, Rfl: 5 .  calcium citrate (CALCITRATE - DOSED IN MG ELEMENTAL CALCIUM) 950 MG tablet, Take 1 tablet by mouth 2 (two) times daily. , Disp: , Rfl:  .  carvedilol (COREG) 25 MG tablet, Take 1 tablet (25 mg total) by mouth 2 (two) times daily with a meal., Disp: 60 tablet, Rfl: 3 .  ferrous sulfate 325 (65 FE) MG tablet, Take 325 mg by mouth 2 (two) times daily with a meal., Disp: , Rfl:  .  gabapentin (NEURONTIN) 300 MG capsule, Take 300 mg by mouth daily. , Disp: , Rfl:  .  JARDIANCE 10 MG TABS tablet, Take 1 tablet by mouth once daily, Disp: 90 tablet, Rfl: 0 .  levothyroxine (SYNTHROID, LEVOTHROID) 75 MCG tablet, Take 75 mcg by mouth daily before breakfast., Disp: , Rfl:  .  losartan (COZAAR) 50 MG tablet, Take 1 tablet (50 mg total) by mouth at bedtime.,  Disp: 30 tablet, Rfl: 6 .  Multiple Vitamins-Minerals (ALIVE ONCE DAILY WOMENS 50+ PO), Take 1 tablet by mouth daily., Disp: , Rfl:  .  omeprazole (PRILOSEC) 20 MG capsule, Take 20 mg by mouth daily. , Disp: , Rfl:  .  potassium chloride (K-DUR) 10 MEQ tablet, Take 2 tablets by mouth once daily, Disp: 60 tablet, Rfl: 0 .  spironolactone (ALDACTONE) 25 MG tablet, Take 1 tablet (25 mg total) by mouth every other day., Disp: 30 tablet, Rfl: 5 .  torsemide (DEMADEX) 20 MG tablet, Take 2 tablets (40 mg total) by mouth daily., Disp: 60 tablet, Rfl: 3 .  acetaminophen (TYLENOL) 500 MG tablet, Take 500-1,000 mg by mouth every 6 (six) hours as needed (for pain or headaches)., Disp: , Rfl:  .  Biotin 10 MG CAPS, Take 10 mg by mouth daily., Disp: , Rfl:  .  CHONDROITIN SULFATE PO, Take 250 mg by mouth daily., Disp: , Rfl:  .  insulin glargine (LANTUS) 100 UNIT/ML injection, Inject 20 Units into the skin 2 (two) times daily after a meal. , Disp: , Rfl:  .  isosorbide mononitrate (IMDUR) 30 MG 24 hr tablet, Take 1 tablet by mouth once daily, Disp: 30 tablet, Rfl: 0 .  nitroGLYCERIN (NITROSTAT) 0.4 MG SL tablet, DISSOLVE ONE TABLET UNDER THE TONGUE EVERY 5 MINUTES AS NEEDED FOR CHEST PAIN.,  Disp: 25 tablet, Rfl: 3 .  Travoprost, BAK Free, (TRAVATAN) 0.004 % SOLN ophthalmic solution, Place 1 drop into both eyes at bedtime. , Disp: , Rfl:  Allergies  Allergen Reactions  . Other Other (See Comments)    Blood products- Patient won't accept any  . Sulfa Antibiotics Rash  . Uloric [Febuxostat] Rash     Social History   Socioeconomic History  . Marital status: Widowed    Spouse name: Not on file  . Number of children: 3  . Years of education: Not on file  . Highest education level: Some college, no degree  Occupational History  . Occupation: retired  Scientific laboratory technician  . Financial resource strain: Not hard at all  . Food insecurity    Worry: Never true    Inability: Never true  . Transportation needs     Medical: No    Non-medical: No  Tobacco Use  . Smoking status: Former Smoker    Packs/day: 1.00    Years: 22.00    Pack years: 22.00    Types: Cigarettes    Quit date: 1983    Years since quitting: 37.8  . Smokeless tobacco: Never Used  Substance and Sexual Activity  . Alcohol use: Yes    Comment: 05/22/2018 "glass of wine couple times/wk"  . Drug use: Not Currently  . Sexual activity: Not Currently    Birth control/protection: None, Post-menopausal  Lifestyle  . Physical activity    Days per week: Not on file    Minutes per session: Not on file  . Stress: Not on file  Relationships  . Social Herbalist on phone: Not on file    Gets together: Not on file    Attends religious service: Not on file    Active member of club or organization: Not on file    Attends meetings of clubs or organizations: Not on file    Relationship status: Not on file  . Intimate partner violence    Fear of current or ex partner: Not on file    Emotionally abused: Not on file    Physically abused: Not on file    Forced sexual activity: Not on file  Other Topics Concern  . Not on file  Social History Narrative  . Not on file    Physical Exam Pulmonary:     Effort: Pulmonary effort is normal. No respiratory distress.     Breath sounds: No wheezing or rales.  Musculoskeletal:     Right lower leg: Edema present.     Left lower leg: Edema present.  Skin:    General: Skin is warm and dry.         Future Appointments  Date Time Provider [place]  06/02/2019  7:20 AM [facility]  09/01/2019  7:20 AM [facility]  12/01/2019  7:20 AM [facility]  03/01/2020  7:20 AM [facility]     BP 112/66 (BP Location: Right Arm, Patient Position: Sitting, Cuff Size: Normal)   Pulse 73   Temp 98.4 F (36.9 C)   Wt 213 lb (96.6  kg)   SpO2 94%   BMI 36.56 kg/m   Weight yesterday-213 Last visit weight-213  ATF pt CAO x4 cleaning the kitchen w/o complaints.  She said that her blood sugar has been less than 200 for the past week.  She said that her weights has also been stable.  She  said that she's very frustrated because she doesn't know how her blood sugars and weight is stable "because she's not doing anything different".  Mrs. Akamine denies sob, chest pain and dizziness.  She's out of several medications; her daughter will take her to pick them up tomorrow.    Medication ordered: eliquis Torsemide Gabapentin Potassium Atorvastatin Carvedilol Isosorbide losartatin  Xitlaly Ault, EMT Paramedic [phone] 03/27/2019    ACTION: Home visit completed

## 2019-03-27 ENCOUNTER — Ambulatory Visit (HOSPITAL_COMMUNITY)
Admission: RE | Admit: 2019-03-27 | Discharge: 2019-03-27 | Disposition: A | Discharge status: Home / Self Care | Payer: Medicare HMO | Source: Ambulatory Visit | Attending: Cardiology | Admitting: Cardiology

## 2019-03-27 ENCOUNTER — Other Ambulatory Visit: Payer: Self-pay

## 2019-03-27 ENCOUNTER — Other Ambulatory Visit (HOSPITAL_COMMUNITY): Payer: Self-pay | Admitting: Internal Medicine

## 2019-03-27 ENCOUNTER — Other Ambulatory Visit (HOSPITAL_COMMUNITY): Payer: Self-pay

## 2019-03-27 DIAGNOSIS — I5022 Chronic systolic (congestive) heart failure: Secondary | ICD-10-CM | POA: Diagnosis not present

## 2019-03-27 LAB — BASIC METABOLIC PANEL
Anion gap: 10 (ref 5–15)
BUN: 32 mg/dL — ABNORMAL HIGH (ref 8–23)
CO2: 30 mmol/L (ref 22–32)
Calcium: 9.6 mg/dL (ref 8.9–10.3)
Chloride: 99 mmol/L (ref 98–111)
Creatinine, Ser: 1.68 mg/dL — ABNORMAL HIGH (ref 0.44–1.00)
GFR calc Af Amer: 35 mL/min — ABNORMAL LOW (ref >60)
GFR calc non Af Amer: 30 mL/min — ABNORMAL LOW (ref >60)
Glucose, Bld: 172 mg/dL — ABNORMAL HIGH (ref 70–99)
Potassium: 3.6 mmol/L (ref 3.5–5.1)
Sodium: 139 mmol/L (ref 135–145)

## 2019-03-27 NOTE — Progress Notes (Signed)
Danielle [name] was missing several meds yesterday during our visit. This visit is to revise her pill box.

## 2019-04-02 ENCOUNTER — Other Ambulatory Visit (HOSPITAL_COMMUNITY): Payer: Self-pay

## 2019-04-02 DIAGNOSIS — R102 Pelvic and perineal pain: Secondary | ICD-10-CM | POA: Diagnosis not present

## 2019-04-02 NOTE — Progress Notes (Signed)
Paramedicine Encounter    Patient ID: Danielle [name], female    DOB: Jun 08, 1945, 73 y.o.   MRN: [id number]    Patient Care Team: Seward Carol, MD as PCP - General (Internal Medicine) Jorge Ny, LCSW as Social Worker (Licensed Clinical Social Worker)  Patient Active Problem List   Diagnosis Date Noted  . Encephalopathy acute 05/20/2018  . 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 Acute kidney failure ([place]) 05/15/2018  . Primary localized osteoarthritis of right knee 05/13/2018  . (HFpEF) heart failure with preserved ejection fraction ([place]) 12/20/2017  . Obesity (BMI 30-39.9) 11/11/2017  . OSA (obstructive sleep apnea) 02/18/2017  . Essential hypertension   . Diabetes mellitus without complication ([place])   . Cancer ([place])   . Thyroid disease     Current Outpatient Medications:  .  allopurinol (ZYLOPRIM) 100 MG tablet, Take 1 tablet (100 mg total) by mouth daily., Disp: , Rfl:  .  apixaban (ELIQUIS) 5 MG TABS tablet, Take 1 tablet (5 mg total) by mouth 2 (two) times daily., Disp: 180 tablet, Rfl: 3 .  atorvastatin (LIPITOR) 80 MG tablet, Take 1 tablet (80 mg total) by mouth at bedtime., Disp: 30 tablet, Rfl: 5 .  Biotin 10 MG CAPS, Take 10 mg by mouth daily., Disp: , Rfl:  .  calcium citrate (CALCITRATE - DOSED IN MG ELEMENTAL CALCIUM) 950 MG tablet, Take 1 tablet by mouth 2 (two) times daily. , Disp: , Rfl:  .  carvedilol (COREG) 25 MG tablet, Take 1 tablet (25 mg total) by mouth 2 (two) times daily with a meal., Disp: 60 tablet, Rfl: 3 .  ferrous sulfate 325 (65 FE) MG tablet, Take 325 mg by mouth 2 (two) times daily with a meal., Disp: , Rfl:  .  gabapentin (NEURONTIN) 300 MG capsule, Take 300 mg by mouth daily. , Disp: , Rfl:  .  JARDIANCE 10 MG TABS tablet, Take 1 tablet by mouth once daily, Disp: 90 tablet, Rfl: 0 .  levothyroxine (SYNTHROID, LEVOTHROID) 75 MCG tablet, Take 75 mcg by mouth daily before breakfast., Disp: , Rfl:  .  losartan  (COZAAR) 50 MG tablet, Take 1 tablet (50 mg total) by mouth at bedtime., Disp: 30 tablet, Rfl: 6 .  Multiple Vitamins-Minerals (ALIVE ONCE DAILY WOMENS 50+ PO), Take 1 tablet by mouth daily., Disp: , Rfl:  .  omeprazole (PRILOSEC) 20 MG capsule, Take 20 mg by mouth daily. , Disp: , Rfl:  .  potassium chloride (K-DUR) 10 MEQ tablet, Take 2 tablets by mouth once daily, Disp: 60 tablet, Rfl: 0 .  spironolactone (ALDACTONE) 25 MG tablet, Take 1 tablet (25 mg total) by mouth every other day., Disp: 30 tablet, Rfl: 5 .  torsemide (DEMADEX) 20 MG tablet, Take 2 tablets (40 mg total) by mouth daily., Disp: 60 tablet, Rfl: 3 .  acetaminophen (TYLENOL) 500 MG tablet, Take 500-1,000 mg by mouth every 6 (six) hours as needed (for pain or headaches)., Disp: , Rfl:  .  CHONDROITIN SULFATE PO, Take 250 mg by mouth daily., Disp: , Rfl:  .  insulin glargine (LANTUS) 100 UNIT/ML injection, Inject 20 Units into the skin 2 (two) times daily after a meal. , Disp: , Rfl:  .  isosorbide mononitrate (IMDUR) 30 MG 24 hr tablet, Take 1 tablet by mouth once daily, Disp: 30 tablet, Rfl: 0 .  nitroGLYCERIN (NITROSTAT) 0.4 MG SL tablet, DISSOLVE ONE TABLET UNDER THE TONGUE EVERY 5 MINUTES AS NEEDED FOR CHEST PAIN.,  Disp: 25 tablet, Rfl: 3 .  Travoprost, BAK Free, (TRAVATAN) 0.004 % SOLN ophthalmic solution, Place 1 drop into both eyes at bedtime. , Disp: , Rfl:  Allergies  Allergen Reactions  . Other Other (See Comments)    Blood products- Patient won't accept any  . Sulfa Antibiotics Rash  . Uloric [Febuxostat] Rash     Social History   Socioeconomic History  . Marital status: Widowed    Spouse name: Not on file  . Number of children: 3  . Years of education: Not on file  . Highest education level: Some college, no degree  Occupational History  . Occupation: retired  Scientific laboratory technician  . Financial resource strain: Not hard at all  . Food insecurity    Worry: Never true    Inability: Never true  . Transportation  needs    Medical: No    Non-medical: No  Tobacco Use  . Smoking status: Former Smoker    Packs/day: 1.00    Years: 22.00    Pack years: 22.00    Types: Cigarettes    Quit date: 1983    Years since quitting: 37.8  . Smokeless tobacco: Never Used  Substance and Sexual Activity  . Alcohol use: Yes    Comment: 05/22/2018 "glass of wine couple times/wk"  . Drug use: Not Currently  . Sexual activity: Not Currently    Birth control/protection: None, Post-menopausal  Lifestyle  . Physical activity    Days per week: Not on file    Minutes per session: Not on file  . Stress: Not on file  Relationships  . Social Herbalist on phone: Not on file    Gets together: Not on file    Attends religious service: Not on file    Active member of club or organization: Not on file    Attends meetings of clubs or organizations: Not on file    Relationship status: Not on file  . Intimate partner violence    Fear of current or ex partner: Not on file    Emotionally abused: Not on file    Physically abused: Not on file    Forced sexual activity: Not on file  Other Topics Concern  . Not on file  Social History Narrative  . Not on file    Physical Exam Pulmonary:     Effort: Pulmonary effort is normal. No respiratory distress.     Breath sounds: No wheezing or rales.  Abdominal:     General: There is no distension.  Musculoskeletal:        General: Swelling present.  Skin:    General: Skin is warm and dry.         Future Appointments  Date Time Provider [place]  06/02/2019  7:20 AM [facility]  09/01/2019  7:20 AM [facility]  12/01/2019  7:20 AM [facility]  03/01/2020  7:20 AM [facility]     BP 138/88 (BP Location: Right Arm, Patient Position: Sitting, Cuff Size: Normal)   Pulse 86   Wt 213 lb (96.6 kg)    SpO2 98%   BMI 36.56 kg/m   Weight yesterday-211 Last visit weight-213  ATF pt CAO x4 sitting in the living room. She just came in from an appointment with the GYN.  She doesn't have any complaints today.  She denies sob, chest pain and dizziness.  She's taken all of her medications  for the past week.  Her weight and blood sugar remained stable this week.  rx bottles verified and pill box refilled.    Medication ordered: Isosorbide completely out  [place], EMT Paramedic [phone] 04/02/2019    ACTION: Home visit completed

## 2019-04-09 ENCOUNTER — Other Ambulatory Visit (HOSPITAL_COMMUNITY): Payer: Self-pay

## 2019-04-09 NOTE — Progress Notes (Signed)
Paramedicine Encounter    Patient ID: Danielle [name], female    DOB: 18-Sep-1945, 73 y.o.   MRN: [id number]    Patient Care Team: Seward Carol, MD as PCP - General (Internal Medicine) Jorge Ny, LCSW as Social Worker (Licensed Clinical Social Worker)  Patient Active Problem List   Diagnosis Date Noted  . Encephalopathy acute 05/20/2018  . 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 Acute kidney failure ([place]) 05/15/2018  . Primary localized osteoarthritis of right knee 05/13/2018  . (HFpEF) heart failure with preserved ejection fraction ([place]) 12/20/2017  . Obesity (BMI 30-39.9) 11/11/2017  . OSA (obstructive sleep apnea) 02/18/2017  . Essential hypertension   . Diabetes mellitus without complication ([place])   . Cancer ([place])   . Thyroid disease     Current Outpatient Medications:  .  acetaminophen (TYLENOL) 500 MG tablet, Take 500-1,000 mg by mouth every 6 (six) hours as needed (for pain or headaches)., Disp: , Rfl:  .  allopurinol (ZYLOPRIM) 100 MG tablet, Take 1 tablet (100 mg total) by mouth daily., Disp: , Rfl:  .  apixaban (ELIQUIS) 5 MG TABS tablet, Take 1 tablet (5 mg total) by mouth 2 (two) times daily., Disp: 180 tablet, Rfl: 3 .  atorvastatin (LIPITOR) 80 MG tablet, Take 1 tablet (80 mg total) by mouth at bedtime., Disp: 30 tablet, Rfl: 5 .  Biotin 10 MG CAPS, Take 10 mg by mouth daily., Disp: , Rfl:  .  calcium citrate (CALCITRATE - DOSED IN MG ELEMENTAL CALCIUM) 950 MG tablet, Take 1 tablet by mouth 2 (two) times daily. , Disp: , Rfl:  .  carvedilol (COREG) 25 MG tablet, Take 1 tablet (25 mg total) by mouth 2 (two) times daily with a meal., Disp: 60 tablet, Rfl: 3 .  CHONDROITIN SULFATE PO, Take 250 mg by mouth daily., Disp: , Rfl:  .  ferrous sulfate 325 (65 FE) MG tablet, Take 325 mg by mouth 2 (two) times daily with a meal., Disp: , Rfl:  .  gabapentin (NEURONTIN) 300 MG capsule, Take 300 mg by mouth daily. , Disp: , Rfl:  .  insulin  glargine (LANTUS) 100 UNIT/ML injection, Inject 20 Units into the skin 2 (two) times daily after a meal. , Disp: , Rfl:  .  isosorbide mononitrate (IMDUR) 30 MG 24 hr tablet, Take 1 tablet by mouth once daily, Disp: 30 tablet, Rfl: 0 .  JARDIANCE 10 MG TABS tablet, Take 1 tablet by mouth once daily, Disp: 90 tablet, Rfl: 0 .  levothyroxine (SYNTHROID, LEVOTHROID) 75 MCG tablet, Take 75 mcg by mouth daily before breakfast., Disp: , Rfl:  .  losartan (COZAAR) 50 MG tablet, Take 1 tablet (50 mg total) by mouth at bedtime., Disp: 30 tablet, Rfl: 6 .  Multiple Vitamins-Minerals (ALIVE ONCE DAILY WOMENS 50+ PO), Take 1 tablet by mouth daily., Disp: , Rfl:  .  nitroGLYCERIN (NITROSTAT) 0.4 MG SL tablet, DISSOLVE ONE TABLET UNDER THE TONGUE EVERY 5 MINUTES AS NEEDED FOR CHEST PAIN., Disp: 25 tablet, Rfl: 3 .  omeprazole (PRILOSEC) 20 MG capsule, Take 20 mg by mouth daily. , Disp: , Rfl:  .  potassium chloride (K-DUR) 10 MEQ tablet, Take 2 tablets by mouth once daily, Disp: 60 tablet, Rfl: 0 .  spironolactone (ALDACTONE) 25 MG tablet, Take 1 tablet (25 mg total) by mouth every other day., Disp: 30 tablet, Rfl: 5 .  torsemide (DEMADEX) 20 MG tablet, Take 2 tablets (40 mg total) by mouth daily.,  Disp: 60 tablet, Rfl: 3 .  Travoprost, BAK Free, (TRAVATAN) 0.004 % SOLN ophthalmic solution, Place 1 drop into both eyes at bedtime. , Disp: , Rfl:  Allergies  Allergen Reactions  . Other Other (See Comments)    Blood products- Patient won't accept any  . Sulfa Antibiotics Rash  . Uloric [Febuxostat] Rash     Social History   Socioeconomic History  . Marital status: Widowed    Spouse name: Not on file  . Number of children: 3  . Years of education: Not on file  . Highest education level: Some college, no degree  Occupational History  . Occupation: retired  Scientific laboratory technician  . Financial resource strain: Not hard at all  . Food insecurity    Worry: Never true    Inability: Never true  . Transportation  needs    Medical: No    Non-medical: No  Tobacco Use  . Smoking status: Former Smoker    Packs/day: 1.00    Years: 22.00    Pack years: 22.00    Types: Cigarettes    Quit date: 1983    Years since quitting: 37.8  . Smokeless tobacco: Never Used  Substance and Sexual Activity  . Alcohol use: Yes    Comment: 05/22/2018 "glass of wine couple times/wk"  . Drug use: Not Currently  . Sexual activity: Not Currently    Birth control/protection: None, Post-menopausal  Lifestyle  . Physical activity    Days per week: Not on file    Minutes per session: Not on file  . Stress: Not on file  Relationships  . Social Herbalist on phone: Not on file    Gets together: Not on file    Attends religious service: Not on file    Active member of club or organization: Not on file    Attends meetings of clubs or organizations: Not on file    Relationship status: Not on file  . Intimate partner violence    Fear of current or ex partner: Not on file    Emotionally abused: Not on file    Physically abused: Not on file    Forced sexual activity: Not on file  Other Topics Concern  . Not on file  Social History Narrative  . Not on file    Physical Exam Pulmonary:     Effort: Pulmonary effort is normal. No respiratory distress.     Breath sounds: No wheezing.  Abdominal:     General: There is no distension.  Skin:    General: Skin is warm and dry.         Future Appointments  Date Time Provider [place]  06/02/2019  7:20 AM [facility]  09/01/2019  7:20 AM [facility]  12/01/2019  7:20 AM [facility]  03/01/2020  7:20 AM [facility]     There were no vitals taken for this visit.  Weight yesterday-didn't weigh Last visit weight-213  ATF pt CAO x4 sitting in the living room with no complaints. She said that  she had taking over the counter medication for constipation. She said that she was constipated for several days prior to today.  She denies sob, chest pain and dizziness.  She also denies nausea and abdominal pain.  rx bottles verified and pill box refilled.   She has a 4 lb weight gain in the past week.  She said that she "doesn't  know how she keeps gaining weight).  We talked about the foods that she's eating; she doesn't seem to understand that most of the food that she's eating is high in sodium.   Medication ordered : Isosorbide jardiance levothyroxine  Meri Pelot, EMT Paramedic [phone] 04/09/2019    ACTION: Home visit completed

## 2019-04-16 ENCOUNTER — Other Ambulatory Visit (HOSPITAL_COMMUNITY): Payer: Self-pay

## 2019-04-16 NOTE — Progress Notes (Signed)
Paramedicine Encounter    Patient ID: Danielle [name], female    DOB: July 21, 1945, 73 y.o.   MRN: [id number]    Patient Care Team: Seward Carol, MD as PCP - General (Internal Medicine) Jorge Ny, LCSW as Social Worker (Licensed Clinical Social Worker)  Patient Active Problem List   Diagnosis Date Noted  . Encephalopathy acute 05/20/2018  . 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 Acute kidney failure ([place]) 05/15/2018  . Primary localized osteoarthritis of right knee 05/13/2018  . (HFpEF) heart failure with preserved ejection fraction ([place]) 12/20/2017  . Obesity (BMI 30-39.9) 11/11/2017  . OSA (obstructive sleep apnea) 02/18/2017  . Essential hypertension   . Diabetes mellitus without complication ([place])   . Cancer ([place])   . Thyroid disease     Current Outpatient Medications:  .  allopurinol (ZYLOPRIM) 100 MG tablet, Take 1 tablet (100 mg total) by mouth daily., Disp: , Rfl:  .  acetaminophen (TYLENOL) 500 MG tablet, Take 500-1,000 mg by mouth every 6 (six) hours as needed (for pain or headaches)., Disp: , Rfl:  .  apixaban (ELIQUIS) 5 MG TABS tablet, Take 1 tablet (5 mg total) by mouth 2 (two) times daily., Disp: 180 tablet, Rfl: 3 .  atorvastatin (LIPITOR) 80 MG tablet, Take 1 tablet (80 mg total) by mouth at bedtime., Disp: 30 tablet, Rfl: 5 .  Biotin 10 MG CAPS, Take 10 mg by mouth daily., Disp: , Rfl:  .  calcium citrate (CALCITRATE - DOSED IN MG ELEMENTAL CALCIUM) 950 MG tablet, Take 1 tablet by mouth 2 (two) times daily. , Disp: , Rfl:  .  carvedilol (COREG) 25 MG tablet, Take 1 tablet (25 mg total) by mouth 2 (two) times daily with a meal., Disp: 60 tablet, Rfl: 3 .  CHONDROITIN SULFATE PO, Take 250 mg by mouth daily., Disp: , Rfl:  .  ferrous sulfate 325 (65 FE) MG tablet, Take 325 mg by mouth 2 (two) times daily with a meal., Disp: , Rfl:  .  gabapentin (NEURONTIN) 300 MG capsule, Take 300 mg by mouth daily. , Disp: , Rfl:  .  insulin  glargine (LANTUS) 100 UNIT/ML injection, Inject 20 Units into the skin 2 (two) times daily after a meal. , Disp: , Rfl:  .  isosorbide mononitrate (IMDUR) 30 MG 24 hr tablet, Take 1 tablet by mouth once daily, Disp: 30 tablet, Rfl: 0 .  JARDIANCE 10 MG TABS tablet, Take 1 tablet by mouth once daily, Disp: 90 tablet, Rfl: 0 .  levothyroxine (SYNTHROID, LEVOTHROID) 75 MCG tablet, Take 75 mcg by mouth daily before breakfast., Disp: , Rfl:  .  losartan (COZAAR) 50 MG tablet, Take 1 tablet (50 mg total) by mouth at bedtime., Disp: 30 tablet, Rfl: 6 .  Multiple Vitamins-Minerals (ALIVE ONCE DAILY WOMENS 50+ PO), Take 1 tablet by mouth daily., Disp: , Rfl:  .  nitroGLYCERIN (NITROSTAT) 0.4 MG SL tablet, DISSOLVE ONE TABLET UNDER THE TONGUE EVERY 5 MINUTES AS NEEDED FOR CHEST PAIN., Disp: 25 tablet, Rfl: 3 .  omeprazole (PRILOSEC) 20 MG capsule, Take 20 mg by mouth daily. , Disp: , Rfl:  .  potassium chloride (K-DUR) 10 MEQ tablet, Take 2 tablets by mouth once daily, Disp: 60 tablet, Rfl: 0 .  spironolactone (ALDACTONE) 25 MG tablet, Take 1 tablet (25 mg total) by mouth every other day., Disp: 30 tablet, Rfl: 5 .  torsemide (DEMADEX) 20 MG tablet, Take 2 tablets (40 mg total) by mouth daily.,  Disp: 60 tablet, Rfl: 3 .  Travoprost, BAK Free, (TRAVATAN) 0.004 % SOLN ophthalmic solution, Place 1 drop into both eyes at bedtime. , Disp: , Rfl:  Allergies  Allergen Reactions  . Other Other (See Comments)    Blood products- Patient won't accept any  . Sulfa Antibiotics Rash  . Uloric [Febuxostat] Rash     Social History   Socioeconomic History  . Marital status: Widowed    Spouse name: Not on file  . Number of children: 3  . Years of education: Not on file  . Highest education level: Some college, no degree  Occupational History  . Occupation: retired  Scientific laboratory technician  . Financial resource strain: Not hard at all  . Food insecurity    Worry: Never true    Inability: Never true  . Transportation  needs    Medical: No    Non-medical: No  Tobacco Use  . Smoking status: Former Smoker    Packs/day: 1.00    Years: 22.00    Pack years: 22.00    Types: Cigarettes    Quit date: 1983    Years since quitting: 37.9  . Smokeless tobacco: Never Used  Substance and Sexual Activity  . Alcohol use: Yes    Comment: 05/22/2018 "glass of wine couple times/wk"  . Drug use: Not Currently  . Sexual activity: Not Currently    Birth control/protection: None, Post-menopausal  Lifestyle  . Physical activity    Days per week: Not on file    Minutes per session: Not on file  . Stress: Not on file  Relationships  . Social Herbalist on phone: Not on file    Gets together: Not on file    Attends religious service: Not on file    Active member of club or organization: Not on file    Attends meetings of clubs or organizations: Not on file    Relationship status: Not on file  . Intimate partner violence    Fear of current or ex partner: Not on file    Emotionally abused: Not on file    Physically abused: Not on file    Forced sexual activity: Not on file  Other Topics Concern  . Not on file  Social History Narrative  . Not on file    Physical Exam Pulmonary:     Effort: Pulmonary effort is normal. No respiratory distress.     Breath sounds: No wheezing or rales.  Skin:    General: Skin is warm and dry.         Future Appointments  Date Time Provider [place]  06/02/2019  7:20 AM [facility]  09/01/2019  7:20 AM [facility]  12/01/2019  7:20 AM [facility]  03/01/2020  7:20 AM [facility]     BP 118/76 (BP Location: Right Arm, Patient Position: Sitting, Cuff Size: Normal)   Pulse 67   Temp 98 F (36.7 C)   Resp 16   SpO2 98%   Weight yesterday-215 Last visit weight-217  ATF pt CAO x4 watching tv.   She said that she's still having trouble maintaining her blood sugar and keeping the readings below 200.  We went over the foods that she's eating.  She's taken all of her medications for the week; her daughter was unable to pick up the medications from the pharmacy.  She's completely out of the levothryoxin, she know where  to put the med when she gets it.  She denies sob, chest pain and dizziness. rx bottles verified and pill box refilled.     Medication ordered Danielle [name], EMT Paramedic [phone] 04/16/2019    ACTION: Home visit completed

## 2019-05-01 ENCOUNTER — Other Ambulatory Visit (HOSPITAL_COMMUNITY): Payer: Self-pay

## 2019-05-01 NOTE — Progress Notes (Signed)
Paramedicine Encounter    Patient ID: Danielle [name], female    DOB: 1945-06-05, 73 y.o.   MRN: [id number]    Patient Care Team: Seward Carol, MD as PCP - General (Internal Medicine) Jorge Ny, LCSW as Social Worker (Licensed Clinical Social Worker)  Patient Active Problem List   Diagnosis Date Noted  . Encephalopathy acute 05/20/2018  . 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 Acute kidney failure ([place]) 05/15/2018  . Primary localized osteoarthritis of right knee 05/13/2018  . (HFpEF) heart failure with preserved ejection fraction ([place]) 12/20/2017  . Obesity (BMI 30-39.9) 11/11/2017  . OSA (obstructive sleep apnea) 02/18/2017  . Essential hypertension   . Diabetes mellitus without complication ([place])   . Cancer ([place])   . Thyroid disease     Current Outpatient Medications:  .  allopurinol (ZYLOPRIM) 100 MG tablet, Take 1 tablet (100 mg total) by mouth daily., Disp: , Rfl:  .  apixaban (ELIQUIS) 5 MG TABS tablet, Take 1 tablet (5 mg total) by mouth 2 (two) times daily., Disp: 180 tablet, Rfl: 3 .  atorvastatin (LIPITOR) 80 MG tablet, Take 1 tablet (80 mg total) by mouth at bedtime., Disp: 30 tablet, Rfl: 5 .  Biotin 10 MG CAPS, Take 10 mg by mouth daily., Disp: , Rfl:  .  calcium citrate (CALCITRATE - DOSED IN MG ELEMENTAL CALCIUM) 950 MG tablet, Take 1 tablet by mouth 2 (two) times daily. , Disp: , Rfl:  .  carvedilol (COREG) 25 MG tablet, Take 1 tablet (25 mg total) by mouth 2 (two) times daily with a meal., Disp: 60 tablet, Rfl: 3 .  ferrous sulfate 325 (65 FE) MG tablet, Take 325 mg by mouth 2 (two) times daily with a meal., Disp: , Rfl:  .  gabapentin (NEURONTIN) 300 MG capsule, Take 300 mg by mouth daily. , Disp: , Rfl:  .  isosorbide mononitrate (IMDUR) 30 MG 24 hr tablet, Take 1 tablet by mouth once daily, Disp: 30 tablet, Rfl: 0 .  JARDIANCE 10 MG TABS tablet, Take 1 tablet by mouth once daily, Disp: 90 tablet, Rfl: 0 .  omeprazole  (PRILOSEC) 20 MG capsule, Take 20 mg by mouth daily. , Disp: , Rfl:  .  potassium chloride (K-DUR) 10 MEQ tablet, Take 2 tablets by mouth once daily, Disp: 60 tablet, Rfl: 0 .  spironolactone (ALDACTONE) 25 MG tablet, Take 1 tablet (25 mg total) by mouth every other day., Disp: 30 tablet, Rfl: 5 .  torsemide (DEMADEX) 20 MG tablet, Take 2 tablets (40 mg total) by mouth daily., Disp: 60 tablet, Rfl: 3 .  acetaminophen (TYLENOL) 500 MG tablet, Take 500-1,000 mg by mouth every 6 (six) hours as needed (for pain or headaches)., Disp: , Rfl:  .  CHONDROITIN SULFATE PO, Take 250 mg by mouth daily., Disp: , Rfl:  .  insulin glargine (LANTUS) 100 UNIT/ML injection, Inject 20 Units into the skin 2 (two) times daily after a meal. , Disp: , Rfl:  .  levothyroxine (SYNTHROID, LEVOTHROID) 75 MCG tablet, Take 75 mcg by mouth daily before breakfast., Disp: , Rfl:  .  losartan (COZAAR) 50 MG tablet, Take 1 tablet (50 mg total) by mouth at bedtime., Disp: 30 tablet, Rfl: 6 .  Multiple Vitamins-Minerals (ALIVE ONCE DAILY WOMENS 50+ PO), Take 1 tablet by mouth daily., Disp: , Rfl:  .  nitroGLYCERIN (NITROSTAT) 0.4 MG SL tablet, DISSOLVE ONE TABLET UNDER THE TONGUE EVERY 5 MINUTES AS NEEDED FOR CHEST PAIN.,  Disp: 25 tablet, Rfl: 3 .  Travoprost, BAK Free, (TRAVATAN) 0.004 % SOLN ophthalmic solution, Place 1 drop into both eyes at bedtime. , Disp: , Rfl:  Allergies  Allergen Reactions  . Other Other (See Comments)    Blood products- Patient won't accept any  . Sulfa Antibiotics Rash  . Uloric [Febuxostat] Rash     Social History   Socioeconomic History  . Marital status: Widowed    Spouse name: Not on file  . Number of children: 3  . Years of education: Not on file  . Highest education level: Some college, no degree  Occupational History  . Occupation: retired  Scientific laboratory technician  . Financial resource strain: Not hard at all  . Food insecurity    Worry: Never true    Inability: Never true  . Transportation  needs    Medical: No    Non-medical: No  Tobacco Use  . Smoking status: Former Smoker    Packs/day: 1.00    Years: 22.00    Pack years: 22.00    Types: Cigarettes    Quit date: 1983    Years since quitting: 37.9  . Smokeless tobacco: Never Used  Substance and Sexual Activity  . Alcohol use: Yes    Comment: 05/22/2018 "glass of wine couple times/wk"  . Drug use: Not Currently  . Sexual activity: Not Currently    Birth control/protection: None, Post-menopausal  Lifestyle  . Physical activity    Days per week: Not on file    Minutes per session: Not on file  . Stress: Not on file  Relationships  . Social Herbalist on phone: Not on file    Gets together: Not on file    Attends religious service: Not on file    Active member of club or organization: Not on file    Attends meetings of clubs or organizations: Not on file    Relationship status: Not on file  . Intimate partner violence    Fear of current or ex partner: Not on file    Emotionally abused: Not on file    Physically abused: Not on file    Forced sexual activity: Not on file  Other Topics Concern  . Not on file  Social History Narrative  . Not on file    Physical Exam Pulmonary:     Effort: Pulmonary effort is normal. No respiratory distress.     Breath sounds: No wheezing.  Musculoskeletal:        General: No swelling.  Skin:    General: Skin is warm and dry.         Future Appointments  Date Time Provider [place]  06/02/2019  7:20 AM [facility]  09/01/2019  7:20 AM [facility]  12/01/2019  7:20 AM [facility]  03/01/2020  7:20 AM [facility]     BP 140/88 (BP Location: Right Arm, Patient Position: Sitting)   Temp 98 F (36.7 C)   Wt 212 lb (96.2 kg)   SpO2 97%   BMI 36.39 kg/m   Weight yesterday-212 Last visit  weight-217 (11/12)  ATF pt CAO x4 sitting in the living room watching tv.  She said that her blood sugars has been elevated lately.  Her physician increase insulin to 55 units.  She said that she "doesn't know why her blood sugar is so high".  She becomes very defensive when we discussed the  foods that she ate during this past week.  She's taken all of her medications.  She's currently out of several meds today.  Her daughter will pick up her prescriptions later. Mrs. Quella said that she will call me when she returns.  rx bottles verified.     Medication ordered: liquis Atorvastatin Calcium Carvedilol filled until Monday evening Gabapentin Potassium Isosorbide losartatin Spirolactone Torsemide filled until [place], EMT Paramedic [phone] 05/01/2019    ACTION: Home visit completed

## 2019-05-06 ENCOUNTER — Other Ambulatory Visit (HOSPITAL_COMMUNITY): Payer: Self-pay

## 2019-05-06 NOTE — Progress Notes (Signed)
Paramedicine Encounter    Patient ID: Danielle [name], female    DOB: 1945/10/05, 73 y.o.   MRN: [id number]    Patient Care Team: Seward Carol, MD as PCP - General (Internal Medicine) Jorge Ny, LCSW as Social Worker (Licensed Clinical Social Worker)  Patient Active Problem List   Diagnosis Date Noted  . Encephalopathy acute 05/20/2018  . 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 Acute kidney failure ([place]) 05/15/2018  . Primary localized osteoarthritis of right knee 05/13/2018  . (HFpEF) heart failure with preserved ejection fraction ([place]) 12/20/2017  . Obesity (BMI 30-39.9) 11/11/2017  . OSA (obstructive sleep apnea) 02/18/2017  . Essential hypertension   . Diabetes mellitus without complication ([place])   . Cancer ([place])   . Thyroid disease     Current Outpatient Medications:  .  allopurinol (ZYLOPRIM) 100 MG tablet, Take 1 tablet (100 mg total) by mouth daily., Disp: , Rfl:  .  apixaban (ELIQUIS) 5 MG TABS tablet, Take 1 tablet (5 mg total) by mouth 2 (two) times daily., Disp: 180 tablet, Rfl: 3 .  atorvastatin (LIPITOR) 80 MG tablet, Take 1 tablet (80 mg total) by mouth at bedtime., Disp: 30 tablet, Rfl: 5 .  carvedilol (COREG) 25 MG tablet, Take 1 tablet (25 mg total) by mouth 2 (two) times daily with a meal., Disp: 60 tablet, Rfl: 3 .  ferrous sulfate 325 (65 FE) MG tablet, Take 325 mg by mouth 2 (two) times daily with a meal., Disp: , Rfl:  .  gabapentin (NEURONTIN) 300 MG capsule, Take 300 mg by mouth daily. , Disp: , Rfl:  .  insulin glargine (LANTUS) 100 UNIT/ML injection, Inject 20 Units into the skin 2 (two) times daily after a meal. , Disp: , Rfl:  .  isosorbide mononitrate (IMDUR) 30 MG 24 hr tablet, Take 1 tablet by mouth once daily, Disp: 30 tablet, Rfl: 0 .  JARDIANCE 10 MG TABS tablet, Take 1 tablet by mouth once daily, Disp: 90 tablet, Rfl: 0 .  levothyroxine (SYNTHROID, LEVOTHROID) 75 MCG tablet, Take 75 mcg by mouth daily before  breakfast., Disp: , Rfl:  .  losartan (COZAAR) 50 MG tablet, Take 1 tablet (50 mg total) by mouth at bedtime., Disp: 30 tablet, Rfl: 6 .  Multiple Vitamins-Minerals (ALIVE ONCE DAILY WOMENS 50+ PO), Take 1 tablet by mouth daily., Disp: , Rfl:  .  omeprazole (PRILOSEC) 20 MG capsule, Take 20 mg by mouth daily. , Disp: , Rfl:  .  potassium chloride (K-DUR) 10 MEQ tablet, Take 2 tablets by mouth once daily, Disp: 60 tablet, Rfl: 0 .  spironolactone (ALDACTONE) 25 MG tablet, Take 1 tablet (25 mg total) by mouth every other day., Disp: 30 tablet, Rfl: 5 .  torsemide (DEMADEX) 20 MG tablet, Take 2 tablets (40 mg total) by mouth daily., Disp: 60 tablet, Rfl: 3 .  acetaminophen (TYLENOL) 500 MG tablet, Take 500-1,000 mg by mouth every 6 (six) hours as needed (for pain or headaches)., Disp: , Rfl:  .  Biotin 10 MG CAPS, Take 10 mg by mouth daily., Disp: , Rfl:  .  calcium citrate (CALCITRATE - DOSED IN MG ELEMENTAL CALCIUM) 950 MG tablet, Take 1 tablet by mouth 2 (two) times daily. , Disp: , Rfl:  .  CHONDROITIN SULFATE PO, Take 250 mg by mouth daily., Disp: , Rfl:  .  nitroGLYCERIN (NITROSTAT) 0.4 MG SL tablet, DISSOLVE ONE TABLET UNDER THE TONGUE EVERY 5 MINUTES AS NEEDED FOR CHEST PAIN.,  Disp: 25 tablet, Rfl: 3 .  Travoprost, BAK Free, (TRAVATAN) 0.004 % SOLN ophthalmic solution, Place 1 drop into both eyes at bedtime. , Disp: , Rfl:  Allergies  Allergen Reactions  . Other Other (See Comments)    Blood products- Patient won't accept any  . Sulfa Antibiotics Rash  . Uloric [Febuxostat] Rash     Social History   Socioeconomic History  . Marital status: Widowed    Spouse name: Not on file  . Number of children: 3  . Years of education: Not on file  . Highest education level: Some college, no degree  Occupational History  . Occupation: retired  Tobacco Use  . Smoking status: Former Smoker    Packs/day: 1.00    Years: 22.00    Pack years: 22.00    Types: Cigarettes    Quit date: 1983     Years since quitting: 37.9  . Smokeless tobacco: Never Used  Substance and Sexual Activity  . Alcohol use: Yes    Comment: 05/22/2018 "glass of wine couple times/wk"  . Drug use: Not Currently  . Sexual activity: Not Currently    Birth control/protection: None, Post-menopausal  Other Topics Concern  . Not on file  Social History Narrative  . Not on file   Social Determinants of Health   Financial Resource Strain: Low Risk   . Difficulty of Paying Living Expenses: Not hard at all  Food Insecurity: No Food Insecurity  . Worried About Charity fundraiser in the Last Year: Never true  . Ran Out of Food in the Last Year: Never true  Transportation Needs: No Transportation Needs  . Lack of Transportation (Medical): No  . Lack of Transportation (Non-Medical): No  Physical Activity:   . Days of Exercise per Week: Not on file  . Minutes of Exercise per Session: Not on file  Stress:   . Feeling of Stress : Not on file  Social Connections:   . Frequency of Communication with Friends and Family: Not on file  . Frequency of Social Gatherings with Friends and Family: Not on file  . Attends Religious Services: Not on file  . Active Member of Clubs or Organizations: Not on file  . Attends Archivist Meetings: Not on file  . Marital Status: Not on file  Intimate Partner Violence:   . Fear of Current or Ex-Partner: Not on file  . Emotionally Abused: Not on file  . Physically Abused: Not on file  . Sexually Abused: Not on file    Physical Exam Pulmonary:     Effort: Pulmonary effort is normal. No respiratory distress.     Breath sounds: No wheezing or rales.  Abdominal:     General: There is no distension.  Skin:    General: Skin is warm and dry.         Future Appointments  Date Time Provider [place]  05/14/2019  9:30 AM Shamleffer, Melanie Crazier, MD [facility] None  06/02/2019  7:20 AM [facility]  09/01/2019  7:20  AM [facility]  12/01/2019  7:20 AM [facility]  03/01/2020  7:20 AM [facility]     BP 102/72 (BP Location: Right Arm, Patient Position: Sitting, Cuff Size: Large)   Pulse 76   Temp 98.2 F (36.8 C)   SpO2 95%    ATF pt CAO x4 sitting in the living room w/no complaints. She denies sob,, chest pain  and dizziness.  She's still worried that her blood sugars are "all over the place". rx bottles verified and pill box refilled. She's out of several meds, I will re-visit this week if her daughter is able to pick them up.      Medication ordered: called in last visit  [place], EMT Paramedic [phone] 05/08/2019    ACTION: Home visit completed

## 2019-05-07 ENCOUNTER — Other Ambulatory Visit: Payer: Self-pay | Admitting: Internal Medicine

## 2019-05-07 ENCOUNTER — Ambulatory Visit
Admission: RE | Admit: 2019-05-07 | Discharge: 2019-05-07 | Disposition: A | Discharge status: Home / Self Care | Payer: Medicare HMO | Source: Ambulatory Visit | Attending: Internal Medicine | Admitting: Internal Medicine

## 2019-05-07 DIAGNOSIS — M25559 Pain in unspecified hip: Secondary | ICD-10-CM | POA: Diagnosis not present

## 2019-05-07 DIAGNOSIS — E1140 Type 2 diabetes mellitus with diabetic neuropathy, unspecified: Secondary | ICD-10-CM | POA: Diagnosis not present

## 2019-05-07 DIAGNOSIS — M542 Cervicalgia: Secondary | ICD-10-CM

## 2019-05-07 DIAGNOSIS — M109 Gout, unspecified: Secondary | ICD-10-CM | POA: Diagnosis not present

## 2019-05-07 DIAGNOSIS — D509 Iron deficiency anemia, unspecified: Secondary | ICD-10-CM | POA: Diagnosis not present

## 2019-05-07 DIAGNOSIS — M1611 Unilateral primary osteoarthritis, right hip: Secondary | ICD-10-CM | POA: Diagnosis not present

## 2019-05-07 DIAGNOSIS — Z794 Long term (current) use of insulin: Secondary | ICD-10-CM | POA: Diagnosis not present

## 2019-05-11 DIAGNOSIS — E113393 Type 2 diabetes mellitus with moderate nonproliferative diabetic retinopathy without macular edema, bilateral: Secondary | ICD-10-CM | POA: Diagnosis not present

## 2019-05-11 DIAGNOSIS — H04123 Dry eye syndrome of bilateral lacrimal glands: Secondary | ICD-10-CM | POA: Diagnosis not present

## 2019-05-11 DIAGNOSIS — Z794 Long term (current) use of insulin: Secondary | ICD-10-CM | POA: Diagnosis not present

## 2019-05-11 DIAGNOSIS — Z961 Presence of intraocular lens: Secondary | ICD-10-CM | POA: Diagnosis not present

## 2019-05-11 DIAGNOSIS — H401123 Primary open-angle glaucoma, left eye, severe stage: Secondary | ICD-10-CM | POA: Diagnosis not present

## 2019-05-11 DIAGNOSIS — H401112 Primary open-angle glaucoma, right eye, moderate stage: Secondary | ICD-10-CM | POA: Diagnosis not present

## 2019-05-12 ENCOUNTER — Other Ambulatory Visit: Payer: Self-pay

## 2019-05-14 ENCOUNTER — Ambulatory Visit (INDEPENDENT_AMBULATORY_CARE_PROVIDER_SITE_OTHER): Payer: Medicare HMO | Admitting: Internal Medicine

## 2019-05-14 ENCOUNTER — Other Ambulatory Visit (HOSPITAL_COMMUNITY): Payer: Self-pay

## 2019-05-14 ENCOUNTER — Encounter: Payer: Self-pay | Admitting: Internal Medicine

## 2019-05-14 VITALS — BP 110/62 | HR 61 | Temp 97.8°F | Ht 63.0 in | Wt 217.6 lb

## 2019-05-14 DIAGNOSIS — E1142 Type 2 diabetes mellitus with diabetic polyneuropathy: Secondary | ICD-10-CM | POA: Diagnosis not present

## 2019-05-14 DIAGNOSIS — E113599 Type 2 diabetes mellitus with proliferative diabetic retinopathy without macular edema, unspecified eye: Secondary | ICD-10-CM | POA: Diagnosis not present

## 2019-05-14 DIAGNOSIS — E1165 Type 2 diabetes mellitus with hyperglycemia: Secondary | ICD-10-CM | POA: Diagnosis not present

## 2019-05-14 DIAGNOSIS — Z794 Long term (current) use of insulin: Secondary | ICD-10-CM | POA: Diagnosis not present

## 2019-05-14 DIAGNOSIS — E1159 Type 2 diabetes mellitus with other circulatory complications: Secondary | ICD-10-CM | POA: Diagnosis not present

## 2019-05-14 DIAGNOSIS — E119 Type 2 diabetes mellitus without complications: Secondary | ICD-10-CM | POA: Insufficient documentation

## 2019-05-14 LAB — GLUCOSE, POCT (MANUAL RESULT ENTRY): POC Glucose: 68 mg/dl — AB (ref 70–99)

## 2019-05-14 MED ORDER — GLUCOSE BLOOD VI STRP: ORAL_STRIP | 3 refills | Status: DC

## 2019-05-14 MED ORDER — INSULIN PEN NEEDLE 32G X 4 MM MISC: 1.0000 | Freq: Four times a day (QID) | 3 refills | Status: DC

## 2019-05-14 MED ORDER — HUMALOG KWIKPEN 200 UNIT/ML ~~LOC~~ SOPN: 20.0000 [IU] | PEN_INJECTOR | Freq: Three times a day (TID) | SUBCUTANEOUS | 3 refills | Status: DC

## 2019-05-14 NOTE — Progress Notes (Signed)
Paramedicine Encounter    Patient ID: Danielle [name], female    DOB: 04-Dec-1945, 73 y.o.   MRN: [id number]    Patient Care Team: Seward Carol, MD as PCP - General (Internal Medicine) Jorge Ny, LCSW as Social Worker (Licensed Clinical Social Worker)  Patient Active Problem List   Diagnosis Date Noted  . Type 2 diabetes mellitus with hyperglycemia, with long-term current use of insulin ([place]) 05/14/2019  . Diabetes mellitus ([place]) 05/14/2019  . 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, with long-term current use of insulin ([place]) 05/14/2019  . Type 2 diabetes mellitus with diabetic polyneuropathy, with long-term current use of insulin ([place]) 05/14/2019  . Encephalopathy acute 05/20/2018  . 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 Acute kidney failure ([place]) 05/15/2018  . Primary localized osteoarthritis of right knee 05/13/2018  . (HFpEF) heart failure with preserved ejection fraction ([place]) 12/20/2017  . Obesity (BMI 30-39.9) 11/11/2017  . OSA (obstructive sleep apnea) 02/18/2017  . Essential hypertension   . Diabetes mellitus without complication ([place])   . Cancer ([place])   . Thyroid disease     Current Outpatient Medications:  .  allopurinol (ZYLOPRIM) 100 MG tablet, Take 1 tablet (100 mg total) by mouth daily., Disp: , Rfl:  .  apixaban (ELIQUIS) 5 MG TABS tablet, Take 1 tablet (5 mg total) by mouth 2 (two) times daily., Disp: 180 tablet, Rfl: 3 .  atorvastatin (LIPITOR) 80 MG tablet, Take 1 tablet (80 mg total) by mouth at bedtime., Disp: 30 tablet, Rfl: 5 .  carvedilol (COREG) 25 MG tablet, Take 1 tablet (25 mg total) by mouth 2 (two) times daily with a meal., Disp: 60 tablet, Rfl: 3 .  ferrous sulfate 325 (65 FE) MG tablet, Take 325 mg by mouth 2 (two) times daily with a meal., Disp: , Rfl:  .  gabapentin (NEURONTIN) 300 MG capsule, Take 300 mg by mouth daily. , Disp: , Rfl:  .  isosorbide mononitrate (IMDUR) 30 MG 24 hr tablet, Take 1  tablet by mouth once daily, Disp: 30 tablet, Rfl: 0 .  JARDIANCE 10 MG TABS tablet, Take 1 tablet by mouth once daily, Disp: 90 tablet, Rfl: 0 .  levothyroxine (SYNTHROID, LEVOTHROID) 75 MCG tablet, Take 75 mcg by mouth daily before breakfast., Disp: , Rfl:  .  losartan (COZAAR) 50 MG tablet, Take 1 tablet (50 mg total) by mouth at bedtime., Disp: 30 tablet, Rfl: 6 .  Multiple Vitamins-Minerals (ALIVE ONCE DAILY WOMENS 50+ PO), Take 1 tablet by mouth daily., Disp: , Rfl:  .  omeprazole (PRILOSEC) 20 MG capsule, Take 20 mg by mouth daily. , Disp: , Rfl:  .  potassium chloride (K-DUR) 10 MEQ tablet, Take 2 tablets by mouth once daily, Disp: 60 tablet, Rfl: 0 .  spironolactone (ALDACTONE) 25 MG tablet, Take 1 tablet (25 mg total) by mouth every other day., Disp: 30 tablet, Rfl: 5 .  torsemide (DEMADEX) 20 MG tablet, Take 2 tablets (40 mg total) by mouth daily., Disp: 60 tablet, Rfl: 3 .  acetaminophen (TYLENOL) 500 MG tablet, Take 500-1,000 mg by mouth every 6 (six) hours as needed (for pain or headaches)., Disp: , Rfl:  .  Biotin 10 MG CAPS, Take 10 mg by mouth daily., Disp: , Rfl:  .  Blood Glucose Monitoring Suppl (ACCU-CHEK AVIVA) device, by Other route. Use as instructed, Disp: , Rfl:  .  calcium citrate (CALCITRATE - DOSED IN MG ELEMENTAL CALCIUM) 950 MG tablet, Take 1  tablet by mouth 2 (two) times daily. , Disp: , Rfl:  .  CHONDROITIN SULFATE PO, Take 250 mg by mouth daily., Disp: , Rfl:  .  clotrimazole-betamethasone (LOTRISONE) lotion, APPLY LOTION TOPICALLY TWICE DAILY FOR 7 DAYS, Disp: , Rfl:  .  colchicine 0.6 MG tablet, TAKE 1 TABLET BY MOUTH EVERY OTHER DAY TO PREVENT FLARES, Disp: , Rfl:  .  glucose blood test strip, Use as instructed, Disp: 400 each, Rfl: 3 .  insulin glargine (LANTUS) 100 UNIT/ML injection, Inject 80 Units into the skin daily., Disp: , Rfl:  .  Insulin Lispro (HUMALOG KWIKPEN) 200 UNIT/ML SOPN, Inject 20 Units into the skin 3 (three) times daily., Disp: 27 mL, Rfl:  3 .  Insulin Pen Needle 32G X 4 MM MISC, 1 Device by Does not apply route 4 (four) times daily., Disp: 400 each, Rfl: 3 .  nitroGLYCERIN (NITROSTAT) 0.4 MG SL tablet, DISSOLVE ONE TABLET UNDER THE TONGUE EVERY 5 MINUTES AS NEEDED FOR CHEST PAIN., Disp: 25 tablet, Rfl: 3 .  Travoprost, BAK Free, (TRAVATAN) 0.004 % SOLN ophthalmic solution, Place 1 drop into both eyes at bedtime. , Disp: , Rfl:  Allergies  Allergen Reactions  . Other Other (See Comments)    Blood products- Patient won't accept any  . Sulfa Antibiotics Rash  . Uloric [Febuxostat] Rash     Social History   Socioeconomic History  . Marital status: Widowed    Spouse name: Not on file  . Number of children: 3  . Years of education: Not on file  . Highest education level: Some college, no degree  Occupational History  . Occupation: retired  Tobacco Use  . Smoking status: Former Smoker    Packs/day: 1.00    Years: 22.00    Pack years: 22.00    Types: Cigarettes    Quit date: 1983    Years since quitting: 37.9  . Smokeless tobacco: Never Used  Substance and Sexual Activity  . Alcohol use: Yes    Comment: 05/22/2018 "glass of wine couple times/wk"  . Drug use: Not Currently  . Sexual activity: Not Currently    Birth control/protection: None, Post-menopausal  Other Topics Concern  . Not on file  Social History Narrative  . Not on file   Social Determinants of Health   Financial Resource Strain:   . Difficulty of Paying Living Expenses: Not on file  Food Insecurity:   . Worried About Charity fundraiser in the Last Year: Not on file  . Ran Out of Food in the Last Year: Not on file  Transportation Needs:   . Lack of Transportation (Medical): Not on file  . Lack of Transportation (Non-Medical): Not on file  Physical Activity:   . Days of Exercise per Week: Not on file  . Minutes of Exercise per Session: Not on file  Stress:   . Feeling of Stress : Not on file  Social Connections:   . Frequency of  Communication with Friends and Family: Not on file  . Frequency of Social Gatherings with Friends and Family: Not on file  . Attends Religious Services: Not on file  . Active Member of Clubs or Organizations: Not on file  . Attends Archivist Meetings: Not on file  . Marital Status: Not on file  Intimate Partner Violence:   . Fear of Current or Ex-Partner: Not on file  . Emotionally Abused: Not on file  . Physically Abused: Not on file  . Sexually Abused: Not  on file    Physical Exam      Future Appointments  Date Time Provider [place]  06/02/2019  7:20 AM [facility] DEVICE REMOTES [facility]  07/14/2019  9:50 AM Shamleffer, Melanie Crazier, MD [facility] None  09/01/2019  7:20 AM [facility] DEVICE REMOTES [facility]  12/01/2019  7:20 AM [facility] DEVICE [facility]OTES [facility]  03/01/2020  7:20 AM [facility] DEVICE [facility]OTES [facility]     BP 138/70   Pulse 73   Wt 217 lb (98.4 kg)   SpO2 97%   BMI 38.44 kg/m  cbg 212  Weight yesterday-214 Last visit weight-212 (12/4)  ATF pt weight today was done at the hospitatl office, she saw the endocrindiologist.  She said that her blood sugar has been all over the places.  She's taken all of her medications for the past week.  She denies sob, chest pain and dizziness.  She's still urinating without difficulty. The weight recorded is the weight that she got during the doctors visit today. She said that she usually weigh with no clothes on and she had on heavy layers today.    Medication changed lantus 80b units once daily humalog 20   Medication ordered: none  Anamika Kueker, EMT Paramedic [phone] 05/17/2019    ACTION: Home visit completed

## 2019-05-14 NOTE — Progress Notes (Signed)
Name: Hassie Mandt  MRN/ DOB: [id number], 17-Aug-1945   Age/ Sex: 73 y.o., female    PCP: Seward Carol, MD   Reason for Endocrinology Evaluation: Type 2 Diabetes Mellitus     Date of Initial Endocrinology Visit: 05/14/2019     PATIENT IDENTIFIER: Ms. Corinthia Helmers is a 73 y.o. female with a past medical history of T2DM, HTN, Dyslipidemia, CAD, Hx of Breast Ca (S/P chemo and lumpectomy)  and PAF. The patient presented for initial endocrinology clinic visit on 05/14/2019 for consultative assistance with her diabetes management.    HPI: Ms. Leisure was    Diagnosed with DM 20 yrs ago  Prior Medications tried/Intolerance: Metformin- diarrhea, humalog  Currently checking blood sugars 2 x / day,  before breakfast . Hypoglycemia episodes : Yes               Symptoms: shaky and jittery       Frequency: today is the first time in over a month  Hemoglobin A1c has ranged from 6.9% in 2019, peaking at 8.0% in 2020. Patient required assistance for hypoglycemia:  Patient has required hospitalization within the last 1 year from hyper or hypoglycemia:   In terms of diet, the patient eats 2 meals a day, she avoids sugar-sweetened beverages. Drinks a smoothie  in the morning at times, drinks glucerna for breakfast sometimes   Pt was treated with Cipro due to vaginal discomfort and frequency in 12/2018  HOME DIABETES REGIMEN: Jardiance 10 mg daily  Lantus 65 units BID     Statin: Yes ACE-I/ARB:Yes Prior Diabetic Education: yes   METER DOWNLOAD SUMMARY: Did not bring but brought glucose log on the phone    DIABETIC COMPLICATIONS: Microvascular complications:   Neuropathy, CKD III, retinopathy (S/P laser)   Last eye exam: Completed 04/2019  Macrovascular complications:   CAD ( S/P MI and stent placement)  Denies: PVD, CVA   PAST HISTORY: Past Medical History:  Past Medical History:  Diagnosis Date  . Acute combined systolic and diastolic congestive heart failure ([place])  02/05/2017  . 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 Acute kidney failure ([place]) 05/15/2018  . Arthritis    "all over" (05/22/2018)  . Blood dyscrasia    per pt-has small blood cells-appears as if anemic  . Breast cancer, left breast ([place]) 1985  . CHF (congestive heart failure) ([place])   . Complication of anesthesia    difficult to awaken from per pt  . Coronary artery disease   . Dyspnea   . Essential hypertension   . Heart disease   . Heart murmur   . History of gout   . Hypothyroidism   . Obesity (BMI 30-39.9) 11/11/2017  . OSA (obstructive sleep apnea) 02/18/2017    severe obstructive sleep apnea with an AHI of 75.6/h and mild central sleep apnea with a CAI of 7.7/h.  Oxygen saturations dropped as low as 82%.   He is on CPAP at 9 cm H2O.  . OSA on CPAP   . Personal history of chemotherapy   . Personal history of radiation therapy   . Presence of permanent cardiac pacemaker   . Primary localized osteoarthritis of right knee 05/13/2018  . Refusal of blood transfusions as patient is Jehovah's Witness   . Type II diabetes mellitus ([place])    Past Surgical History:  Past Surgical History:  Procedure Laterality Date  . BREAST BIOPSY Left 1985  . BREAST LUMPECTOMY WITH NEEDLE LOCALIZATION AND AXILLARY LYMPH  NODE DISSECTION  1985  . CATARACT EXTRACTION W/ INTRAOCULAR LENS  IMPLANT, BILATERAL Bilateral   . CORONARY ANGIOPLASTY WITH STENT PLACEMENT  2010, 2012,2017    2 done 2010 and 1 replaced 2012 and 2 replaced in 2017  . DILATION AND CURETTAGE OF UTERUS    . INSERT / REPLACE / REMOVE PACEMAKER  07/19/2014  . JOINT REPLACEMENT    . REPLACEMENT TOTAL KNEE Left 09/2012  . THYROIDECTOMY, PARTIAL  1978  . TONSILLECTOMY    . TOTAL KNEE ARTHROPLASTY Right 05/13/2018   Procedure: TOTAL KNEE ARTHROPLASTY;  Surgeon: Marchia Bond, MD;  Location: [facility];  Service: Orthopedics;  Laterality: Right;  . TUBAL LIGATION        Social History:  reports that she  quit smoking about 37 years ago. Her smoking use included cigarettes. She has a 22.00 pack-year smoking history. She has never used smokeless tobacco. She reports current alcohol use. She reports previous drug use. Family History:  Family History  Problem Relation Age of Onset  . Multiple myeloma Mother   . Heart disease Father   . Other Sister   . Heart disease Brother   . Diabetes Sister   . Other Sister      HOME MEDICATIONS: Allergies as of 05/14/2019      Reactions   Other Other (See Comments)   Blood products- Patient won't accept any   Sulfa Antibiotics Rash   Uloric [febuxostat] Rash      Medication List       Accurate as of May 14, 2019 10:00 AM. If you have any questions, ask your nurse or doctor.        Accu-Chek Aviva device by Other route. Use as instructed   acetaminophen 500 MG tablet Commonly known as: TYLENOL Take 500-1,000 mg by mouth every 6 (six) hours as needed (for pain or headaches).   ALIVE ONCE DAILY WOMENS 50+ PO Take 1 tablet by mouth daily.   allopurinol 100 MG tablet Commonly known as: ZYLOPRIM Take 1 tablet (100 mg total) by mouth daily.   apixaban 5 MG Tabs tablet Commonly known as: Eliquis Take 1 tablet (5 mg total) by mouth 2 (two) times daily.   atorvastatin 80 MG tablet Commonly known as: LIPITOR Take 1 tablet (80 mg total) by mouth at bedtime.   Biotin 10 MG Caps Take 10 mg by mouth daily.   calcium citrate 950 (200 Ca) MG tablet Commonly known as: CALCITRATE - dosed in mg elemental calcium Take 1 tablet by mouth 2 (two) times daily.   carvedilol 25 MG tablet Commonly known as: COREG Take 1 tablet (25 mg total) by mouth 2 (two) times daily with a meal.   CHONDROITIN SULFATE PO Take 250 mg by mouth daily.   clotrimazole-betamethasone lotion Commonly known as: LOTRISONE APPLY LOTION TOPICALLY TWICE DAILY FOR 7 DAYS   colchicine 0.6 MG tablet TAKE 1 TABLET BY MOUTH EVERY OTHER DAY TO PREVENT FLARES   ferrous  sulfate 325 (65 FE) MG tablet Take 325 mg by mouth 2 (two) times daily with a meal.   gabapentin 300 MG capsule Commonly known as: NEURONTIN Take 300 mg by mouth daily.   glucose blood test strip Use as instructed   HumaLOG KwikPen 200 UNIT/ML Sopn Generic drug: Insulin Lispro Inject 20 Units into the skin 3 (three) times daily.   Insulin Pen Needle 32G X 4 MM Misc 1 Device by Does not apply route 4 (four) times daily.   isosorbide mononitrate 30 MG  24 hr tablet Commonly known as: IMDUR Take 1 tablet by mouth once daily   Jardiance 10 MG Tabs tablet Generic drug: empagliflozin Take 1 tablet by mouth once daily   Lantus 100 UNIT/ML injection Generic drug: insulin glargine Inject 80 Units into the skin daily.   levothyroxine 75 MCG tablet Commonly known as: SYNTHROID Take 75 mcg by mouth daily before breakfast.   losartan 50 MG tablet Commonly known as: COZAAR Take 1 tablet (50 mg total) by mouth at bedtime.   nitroGLYCERIN 0.4 MG SL tablet Commonly known as: NITROSTAT DISSOLVE ONE TABLET UNDER THE TONGUE EVERY 5 MINUTES AS NEEDED FOR CHEST PAIN.   omeprazole 20 MG capsule Commonly known as: PRILOSEC Take 20 mg by mouth daily.   potassium chloride 10 MEQ tablet Commonly known as: KLOR-CON Take 2 tablets by mouth once daily   spironolactone 25 MG tablet Commonly known as: ALDACTONE Take 1 tablet (25 mg total) by mouth every other day.   torsemide 20 MG tablet Commonly known as: DEMADEX Take 2 tablets (40 mg total) by mouth daily.   Travoprost (BAK Free) 0.004 % Soln ophthalmic solution Commonly known as: TRAVATAN Place 1 drop into both eyes at bedtime.        ALLERGIES: Allergies  Allergen Reactions  . Other Other (See Comments)    Blood products- Patient won't accept any  . Sulfa Antibiotics Rash  . Uloric [Febuxostat] Rash     REVIEW OF SYSTEMS: A comprehensive ROS was conducted with the patient and is negative except as per HPI and below:    Review of Systems  Constitutional: Negative for chills and fever.  HENT: Negative for congestion and sore throat.   Eyes: Negative for pain and discharge.  Respiratory: Negative for cough and shortness of breath.   Cardiovascular: Negative for chest pain and palpitations.  Gastrointestinal: Negative for diarrhea and nausea.  Genitourinary: Positive for frequency.  Musculoskeletal: Positive for joint pain and myalgias.  Skin: Positive for rash.  Neurological: Negative for dizziness and headaches.  Endo/Heme/Allergies: Negative for polydipsia.      OBJECTIVE:   VITAL SIGNS: BP 110/62 (BP Location: Right Arm, Patient Position: Sitting, Cuff Size: Large)   Pulse 61   Temp 97.8 F (36.6 C)   Ht _0  (1.6 m)   Wt 217 lb 9.6 oz (98.7 kg)   SpO2 96%   BMI 38.55 kg/m    PHYSICAL EXAM:  General: Pt appears well and is in NAD  Hydration: Well-hydrated with moist mucous membranes and good skin turgor  HEENT:  Eyes: External eye exam normal without stare, lid lag or exophthalmos.  EOM intact.    Neck: General: Supple without adenopathy or carotid bruits. Thyroid: Thyroid size normal.  No goiter or nodules appreciated. No thyroid bruit.  Lungs: Clear with good BS bilat with no rales, rhonchi, or wheezes  Heart: RRR with normal S1 and S2 and no gallops; no murmurs; no rub  Abdomen: Normoactive bowel sounds, soft, nontender, without masses or organomegaly palpable  Extremities:  Lower extremities - No pretibial edema. No lesions.  Skin: Normal texture and temperature to palpation.   Neuro: MS is good with appropriate affect, pt is alert and Ox3    DM foot exam: 05/14/2019  The skin of the feet is intact without sores or ulcerations. The pedal pulses are undetectable on today's exam The sensation is decreased  to a screening 5.07, 10 gram monofilament bilaterally   DATA REVIEWED:  Lab Results  Component Value Date  HGBA1C 6.9 (H) 05/20/2018    Results for LAVON, HORN  (MRN [id number]) as of 05/14/2019 08:34  Ref. Range 03/27/2019 09:08  Sodium Latest Ref Range: 135 - 145 mmol/L 139  Potassium Latest Ref Range: 3.5 - 5.1 mmol/L 3.6  Chloride Latest Ref Range: 98 - 111 mmol/L 99  CO2 Latest Ref Range: 22 - 32 mmol/L 30  Glucose Latest Ref Range: 70 - 99 mg/dL 172 (H)  BUN Latest Ref Range: 8 - 23 mg/dL 32 (H)  Creatinine Latest Ref Range: 0.44 - 1.00 mg/dL 1.68 (H)  Calcium Latest Ref Range: 8.9 - 10.3 mg/dL 9.6  Anion gap Latest Ref Range: 5 - 15  10  GFR, Est Non African American Latest Ref Range: >60 mL/min 30 (L)  GFR, Est African American Latest Ref Range: >60 mL/min 35 (L)          ASSESSMENT / PLAN / RECOMMENDATIONS:   1) Type 2 Diabetes Mellitus, Poorly controlled, With Neuropathic, CKDIII and Macrovascular  complications - Most recent A1c of 11.2 %. Goal A1c < 7.5 %.    - I have discussed with the patient the pathophysiology of diabetes. We went over the natural progression of the disease. We talked about both insulin resistance and insulin deficiency. We stressed the importance of lifestyle changes including diet and exercise. I explained the complications associated with diabetes including retinopathy, nephropathy, neuropathy as well as increased risk of cardiovascular disease. We went over the benefit seen with glycemic control.   - I explained to the patient that diabetic patients are at higher than normal risk for amputations.   - Pt with a hypoglycemic episode in the office, she drank glucerna in the lobby with repeat BG of 69 mg/dL . Rule of 15 was followed  - Pt is intolerant to metformin.  - I would strongly suggest discontinuing her Jardiance due to last GFR at 35 - will defer to cardiology  - We discussed add on therapy with GLP-1 agonists since its safe with CKD but after discussing the risk of GI side effects, pt opted for prandial insulin.  - Insulin is the safest regimen for her given CKD III - Discussed pharmacokinetics of  basal/bolus insulin and the importance of taking prandial insulin with meals.  - We also discussed avoiding sugar-sweetened beverages and snacks, when possible.  - pt couldn't remember what glucose machine she uses, a generic one will be sent to the pharmacy and she was advised to have that  Info available to the pharmacists.  - Pt encouraged to contact us with glucose issues as she is a moving target at this time   MEDICATIONS:  Decreased Lantus to 80 units daily   Start Humalog 20 units with each meal   EDUCATION / INSTRUCTIONS:  BG monitoring instructions: Patient is instructed to check her blood sugars 3 times a day, before meals .  Call [facility] clinic if: BG persistently < 70 or > 300. . I reviewed the Rule of 15 for the treatment of hypoglycemia in detail with the patient. Literature supplied.   2) Diabetic complications:   Eye: Does  have known diabetic retinopathy.   Neuro/ Feet: Does  have known diabetic peripheral neuropathy.  Renal: Patient does  have known baseline CKD. She is  on an ACEI/ARB at present.  3) Lipids: Patient is  on a statin.    4) Hypertension: She is  at goal of < 140/90 mmHg.     F/U in 8 weeks  Signed electronically by: Mack Guise, MD  [facility]  [facility] [address] [place], [place] [address] Phone: [phone] FAX: [phone]   CC: Seward Carol, [place] [employer] [address] [place] [address] Phone: [phone]  Fax: [phone]    Return to Endocrinology clinic as below: Future Appointments  Date Time Provider [place]  06/02/2019  7:20 AM [facility] DEVICE REMOTES [facility]  07/14/2019  9:50 AM [place], Melanie Crazier, MD [facility] None  09/01/2019  7:20 AM [facility] DEVICE REMOTES [facility]  12/01/2019  7:20 AM [facility] DEVICE REMOTES [facility]  03/01/2020  7:20 AM [facility] DEVICE  REMOTES [facility]

## 2019-05-14 NOTE — Patient Instructions (Addendum)
-   Decrease Lantus to 80 units ONCE daily  - Start Humalog 20 units with each meal    - Check sugar before each meal     HOW TO TREAT LOW BLOOD SUGARS (Blood sugar LESS THAN 70 MG/DL)  Please follow the RULE OF 15 for the treatment of hypoglycemia treatment (when your (blood sugars are less than 70 mg/dL)    STEP 1: Take 15 grams of carbohydrates when your blood sugar is low, which includes:   3-4 GLUCOSE TABS  OR  3-4 OZ OF JUICE OR REGULAR SODA OR  ONE TUBE OF GLUCOSE GEL     STEP 2: RECHECK blood sugar in 15 MINUTES STEP 3: If your blood sugar is still low at the 15 minute recheck --> then, go back to STEP 1 and treat AGAIN with another 15 grams of carbohydrates.

## 2019-05-18 DIAGNOSIS — M25551 Pain in right hip: Secondary | ICD-10-CM | POA: Diagnosis not present

## 2019-05-18 DIAGNOSIS — M542 Cervicalgia: Secondary | ICD-10-CM | POA: Diagnosis not present

## 2019-05-19 ENCOUNTER — Other Ambulatory Visit: Payer: Self-pay

## 2019-05-19 DIAGNOSIS — R69 Illness, unspecified: Secondary | ICD-10-CM | POA: Diagnosis not present

## 2019-05-19 MED ORDER — GLUCOSE BLOOD VI STRP: ORAL_STRIP | 3 refills | Status: DC

## 2019-05-28 ENCOUNTER — Other Ambulatory Visit (HOSPITAL_COMMUNITY): Payer: Self-pay

## 2019-05-28 NOTE — Progress Notes (Signed)
Paramedicine Encounter    Patient ID: Danielle [name], female    DOB: 12-Jul-1945, 73 y.o.   MRN: [id number]    Patient Care Team: Seward Carol, MD as PCP - General (Internal Medicine) Jorge Ny, LCSW as Social Worker (Licensed Clinical Social Worker)  Patient Active Problem List   Diagnosis Date Noted  . Type 2 diabetes mellitus with hyperglycemia, with long-term current use of insulin ([place]) 05/14/2019  . Diabetes mellitus ([place]) 05/14/2019  . 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, with long-term current use of insulin ([place]) 05/14/2019  . Type 2 diabetes mellitus with diabetic polyneuropathy, with long-term current use of insulin ([place]) 05/14/2019  . Encephalopathy acute 05/20/2018  . 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 Acute kidney failure ([place]) 05/15/2018  . Primary localized osteoarthritis of right knee 05/13/2018  . (HFpEF) heart failure with preserved ejection fraction ([place]) 12/20/2017  . Obesity (BMI 30-39.9) 11/11/2017  . OSA (obstructive sleep apnea) 02/18/2017  . Essential hypertension   . Diabetes mellitus without complication ([place])   . Cancer ([place])   . Thyroid disease     Current Outpatient Medications:  .  allopurinol (ZYLOPRIM) 100 MG tablet, Take 1 tablet (100 mg total) by mouth daily., Disp: , Rfl:  .  apixaban (ELIQUIS) 5 MG TABS tablet, Take 1 tablet (5 mg total) by mouth 2 (two) times daily., Disp: 180 tablet, Rfl: 3 .  atorvastatin (LIPITOR) 80 MG tablet, Take 1 tablet (80 mg total) by mouth at bedtime., Disp: 30 tablet, Rfl: 5 .  carvedilol (COREG) 25 MG tablet, Take 1 tablet (25 mg total) by mouth 2 (two) times daily with a meal., Disp: 60 tablet, Rfl: 3 .  colchicine 0.6 MG tablet, TAKE 1 TABLET BY MOUTH EVERY OTHER DAY TO PREVENT FLARES, Disp: , Rfl:  .  ferrous sulfate 325 (65 FE) MG tablet, Take 325 mg by mouth 2 (two) times daily with a meal., Disp: , Rfl:  .  gabapentin (NEURONTIN) 300 MG capsule,  Take 300 mg by mouth daily. , Disp: , Rfl:  .  isosorbide mononitrate (IMDUR) 30 MG 24 hr tablet, Take 1 tablet by mouth once daily, Disp: 30 tablet, Rfl: 0 .  JARDIANCE 10 MG TABS tablet, Take 1 tablet by mouth once daily, Disp: 90 tablet, Rfl: 0 .  levothyroxine (SYNTHROID, LEVOTHROID) 75 MCG tablet, Take 75 mcg by mouth daily before breakfast., Disp: , Rfl:  .  losartan (COZAAR) 50 MG tablet, Take 1 tablet (50 mg total) by mouth at bedtime., Disp: 30 tablet, Rfl: 6 .  Multiple Vitamins-Minerals (ALIVE ONCE DAILY WOMENS 50+ PO), Take 1 tablet by mouth daily., Disp: , Rfl:  .  omeprazole (PRILOSEC) 20 MG capsule, Take 20 mg by mouth daily. , Disp: , Rfl:  .  potassium chloride (K-DUR) 10 MEQ tablet, Take 2 tablets by mouth once daily, Disp: 60 tablet, Rfl: 0 .  spironolactone (ALDACTONE) 25 MG tablet, Take 1 tablet (25 mg total) by mouth every other day., Disp: 30 tablet, Rfl: 5 .  torsemide (DEMADEX) 20 MG tablet, Take 2 tablets (40 mg total) by mouth daily., Disp: 60 tablet, Rfl: 3 .  acetaminophen (TYLENOL) 500 MG tablet, Take 500-1,000 mg by mouth every 6 (six) hours as needed (for pain or headaches)., Disp: , Rfl:  .  Biotin 10 MG CAPS, Take 10 mg by mouth daily., Disp: , Rfl:  .  Blood Glucose Monitoring Suppl (ACCU-CHEK AVIVA) device, by Other route. Use as  instructed, Disp: , Rfl:  .  calcium citrate (CALCITRATE - DOSED IN MG ELEMENTAL CALCIUM) 950 MG tablet, Take 1 tablet by mouth 2 (two) times daily. , Disp: , Rfl:  .  CHONDROITIN SULFATE PO, Take 250 mg by mouth daily., Disp: , Rfl:  .  clotrimazole-betamethasone (LOTRISONE) lotion, APPLY LOTION TOPICALLY TWICE DAILY FOR 7 DAYS, Disp: , Rfl:  .  glucose blood test strip, Use as instructed to test blood sugars 3 times daily E11.65  Freestyle lite, Disp: 400 each, Rfl: 3 .  insulin glargine (LANTUS) 100 UNIT/ML injection, Inject 80 Units into the skin daily., Disp: , Rfl:  .  Insulin Lispro (HUMALOG KWIKPEN) 200 UNIT/ML SOPN, Inject 20  Units into the skin 3 (three) times daily., Disp: 27 mL, Rfl: 3 .  Insulin Pen Needle 32G X 4 MM MISC, 1 Device by Does not apply route 4 (four) times daily., Disp: 400 each, Rfl: 3 .  nitroGLYCERIN (NITROSTAT) 0.4 MG SL tablet, DISSOLVE ONE TABLET UNDER THE TONGUE EVERY 5 MINUTES AS NEEDED FOR CHEST PAIN., Disp: 25 tablet, Rfl: 3 .  Travoprost, BAK Free, (TRAVATAN) 0.004 % SOLN ophthalmic solution, Place 1 drop into both eyes at bedtime. , Disp: , Rfl:  Allergies  Allergen Reactions  . Other Other (See Comments)    Blood products- Patient won't accept any  . Sulfa Antibiotics Rash  . Uloric [Febuxostat] Rash     Social History   Socioeconomic History  . Marital status: Widowed    Spouse name: Not on file  . Number of children: 3  . Years of education: Not on file  . Highest education level: Some college, no degree  Occupational History  . Occupation: retired  Tobacco Use  . Smoking status: Former Smoker    Packs/day: 1.00    Years: 22.00    Pack years: 22.00    Types: Cigarettes    Quit date: 1983    Years since quitting: 38.0  . Smokeless tobacco: Never Used  Substance and Sexual Activity  . Alcohol use: Yes    Comment: 05/22/2018 "glass of wine couple times/wk"  . Drug use: Not Currently  . Sexual activity: Not Currently    Birth control/protection: None, Post-menopausal  Other Topics Concern  . Not on file  Social History Narrative  . Not on file   Social Determinants of Health   Financial Resource Strain:   . Difficulty of Paying Living Expenses: Not on file  Food Insecurity:   . Worried About Charity fundraiser in the Last Year: Not on file  . Ran Out of Food in the Last Year: Not on file  Transportation Needs:   . Lack of Transportation (Medical): Not on file  . Lack of Transportation (Non-Medical): Not on file  Physical Activity:   . Days of Exercise per Week: Not on file  . Minutes of Exercise per Session: Not on file  Stress:   . Feeling of Stress :  Not on file  Social Connections:   . Frequency of Communication with Friends and Family: Not on file  . Frequency of Social Gatherings with Friends and Family: Not on file  . Attends Religious Services: Not on file  . Active Member of Clubs or Organizations: Not on file  . Attends Archivist Meetings: Not on file  . Marital Status: Not on file  Intimate Partner Violence:   . Fear of Current or Ex-Partner: Not on file  . Emotionally Abused: Not on file  .  Physically Abused: Not on file  . Sexually Abused: Not on file    Physical Exam Pulmonary:     Effort: No respiratory distress.     Breath sounds: No wheezing or rales.  Abdominal:     General: There is no distension.  Musculoskeletal:        General: No swelling.     Right lower leg: No edema.     Left lower leg: No edema.  Skin:    General: Skin is warm and dry.         Future Appointments  Date Time Provider [place]  06/02/2019  7:20 AM [facility] DEVICE REMOTES [facility]  07/14/2019  9:50 AM Shamleffer, Melanie Crazier, MD [facility] None  09/01/2019  7:20 AM [facility] DEVICE REMOTES [facility]  12/01/2019  7:20 AM [facility]ICE [facility]OTES [facility]  03/01/2020  7:20 AM [facility] DEVICE [facility]OTES [facility]     BP 100/70 (BP Location: Right Arm, Patient Position: Sitting, Cuff Size: Large)   Pulse (!) 59   Temp 97.7 F (36.5 C)   Wt 207 lb (93.9 kg)   SpO2 98%   BMI 36.67 kg/m  cbg 182  Weight yesterday-206 Last visit weight-217  ATF pt CAO x4 sitting in the living room watching tv.  She denies chest pain, dizziness and sob.  She said that she's looking into getting an aide to help out around the house. She said that she's having trouble with sob while doing house work and cleaning.  She's taken all of her medications for the past two weeks. rx bottles verified and pill box refilled.    Medication  ordered: toresemide Carvedilol Losartan Isosorbide Potassium jardiance Iron eliquis  Parthiv Mucci, EMT Paramedic [phone] 05/28/2019    ACTION: Home visit completed

## 2019-06-02 ENCOUNTER — Ambulatory Visit (INDEPENDENT_AMBULATORY_CARE_PROVIDER_SITE_OTHER): Payer: Medicare HMO | Admitting: *Deleted

## 2019-06-02 ENCOUNTER — Other Ambulatory Visit (HOSPITAL_COMMUNITY): Payer: Self-pay | Admitting: Internal Medicine

## 2019-06-02 ENCOUNTER — Other Ambulatory Visit (HOSPITAL_COMMUNITY): Payer: Self-pay | Admitting: Adult Health

## 2019-06-02 DIAGNOSIS — R001 Bradycardia, unspecified: Secondary | ICD-10-CM | POA: Diagnosis not present

## 2019-06-03 ENCOUNTER — Telehealth: Payer: Self-pay

## 2019-06-03 LAB — CUP PACEART REMOTE DEVICE CHECK
Battery Remaining Longevity: 75 mo
Battery Voltage: 3 V
Brady Statistic AP VP Percent: 0.03 %
Brady Statistic AP VS Percent: 18.82 %
Brady Statistic AS VP Percent: 0.04 %
Brady Statistic AS VS Percent: 81.11 %
Brady Statistic RA Percent Paced: 18.75 %
Brady Statistic RV Percent Paced: 0.09 %
Date Time Interrogation Session: 20210106130721
Implantable Lead Implant Date: 20160222
Implantable Lead Implant Date: 20160222
Implantable Lead Location: 753859
Implantable Lead Location: 753860
Implantable Lead Model: 5076
Implantable Lead Model: 5076
Implantable Pulse Generator Implant Date: 20160222
Lead Channel Impedance Value: 418 Ohm
Lead Channel Impedance Value: 437 Ohm
Lead Channel Impedance Value: 456 Ohm
Lead Channel Impedance Value: 532 Ohm
Lead Channel Pacing Threshold Amplitude: 0.625 V
Lead Channel Pacing Threshold Amplitude: 0.75 V
Lead Channel Pacing Threshold Pulse Width: 0.4 ms
Lead Channel Pacing Threshold Pulse Width: 0.4 ms
Lead Channel Sensing Intrinsic Amplitude: 24.375 mV
Lead Channel Sensing Intrinsic Amplitude: 24.375 mV
Lead Channel Sensing Intrinsic Amplitude: 5.375 mV
Lead Channel Sensing Intrinsic Amplitude: 5.375 mV
Lead Channel Setting Pacing Amplitude: 2 V
Lead Channel Setting Pacing Amplitude: 2 V
Lead Channel Setting Pacing Pulse Width: 0.4 ms
Lead Channel Setting Sensing Sensitivity: 2 mV

## 2019-06-03 NOTE — Telephone Encounter (Signed)
Spoke with patient to remind of missed remote transmission 

## 2019-06-04 ENCOUNTER — Telehealth (HOSPITAL_COMMUNITY): Payer: Self-pay

## 2019-06-04 ENCOUNTER — Other Ambulatory Visit (HOSPITAL_COMMUNITY): Payer: Self-pay

## 2019-06-04 NOTE — Telephone Encounter (Signed)
Spoke to [place], will see her at 4:00 today for visit.

## 2019-06-04 NOTE — Progress Notes (Signed)
Paramedicine Encounter    Patient ID: Danielle [name], female    DOB: 02-18-1946, 74 y.o.   MRN: [id number]   Patient Care Team: Seward Carol, MD as PCP - General (Internal Medicine) Jorge Ny, LCSW as Social Worker (Licensed Clinical Social Worker)  Patient Active Problem List   Diagnosis Date Noted  . Type 2 diabetes mellitus with hyperglycemia, with long-term current use of insulin ([place]) 05/14/2019  . Diabetes mellitus ([place]) 05/14/2019  . 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, with long-term current use of insulin ([place]) 05/14/2019  . Type 2 diabetes mellitus with diabetic polyneuropathy, with long-term current use of insulin ([place]) 05/14/2019  . Encephalopathy acute 05/20/2018  . 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 Acute kidney failure ([place]) 05/15/2018  . Primary localized osteoarthritis of right knee 05/13/2018  . (HFpEF) heart failure with preserved ejection fraction ([place]) 12/20/2017  . Obesity (BMI 30-39.9) 11/11/2017  . OSA (obstructive sleep apnea) 02/18/2017  . Essential hypertension   . Diabetes mellitus without complication ([place])   . Cancer ([place])   . Thyroid disease     Current Outpatient Medications:  .  acetaminophen (TYLENOL) 500 MG tablet, Take 500-1,000 mg by mouth every 6 (six) hours as needed (for pain or headaches)., Disp: , Rfl:  .  allopurinol (ZYLOPRIM) 100 MG tablet, Take 1 tablet (100 mg total) by mouth daily., Disp: , Rfl:  .  apixaban (ELIQUIS) 5 MG TABS tablet, Take 1 tablet (5 mg total) by mouth 2 (two) times daily., Disp: 180 tablet, Rfl: 3 .  atorvastatin (LIPITOR) 80 MG tablet, Take 1 tablet (80 mg total) by mouth at bedtime., Disp: 30 tablet, Rfl: 5 .  Biotin 10 MG CAPS, Take 10 mg by mouth daily., Disp: , Rfl:  .  Blood Glucose Monitoring Suppl (ACCU-CHEK AVIVA) device, by Other route. Use as instructed, Disp: , Rfl:  .  calcium citrate (CALCITRATE - DOSED IN MG ELEMENTAL CALCIUM) 950 MG tablet,  Take 1 tablet by mouth 2 (two) times daily. , Disp: , Rfl:  .  carvedilol (COREG) 25 MG tablet, TAKE 1 TABLET BY MOUTH TWICE DAILY WITH MEALS, Disp: 60 tablet, Rfl: 0 .  clotrimazole-betamethasone (LOTRISONE) lotion, APPLY LOTION TOPICALLY TWICE DAILY FOR 7 DAYS, Disp: , Rfl:  .  ferrous sulfate 325 (65 FE) MG tablet, Take 325 mg by mouth 2 (two) times daily with a meal., Disp: , Rfl:  .  gabapentin (NEURONTIN) 300 MG capsule, Take 300 mg by mouth daily. , Disp: , Rfl:  .  glucose blood test strip, Use as instructed to test blood sugars 3 times daily E11.65  Freestyle lite, Disp: 400 each, Rfl: 3 .  insulin glargine (LANTUS) 100 UNIT/ML injection, Inject 80 Units into the skin daily., Disp: , Rfl:  .  Insulin Lispro (HUMALOG KWIKPEN) 200 UNIT/ML SOPN, Inject 20 Units into the skin 3 (three) times daily., Disp: 27 mL, Rfl: 3 .  Insulin Pen Needle 32G X 4 MM MISC, 1 Device by Does not apply route 4 (four) times daily., Disp: 400 each, Rfl: 3 .  isosorbide mononitrate (IMDUR) 30 MG 24 hr tablet, Take 1 tablet by mouth once daily, Disp: 30 tablet, Rfl: 0 .  JARDIANCE 10 MG TABS tablet, Take 1 tablet by mouth once daily, Disp: 90 tablet, Rfl: 0 .  levothyroxine (SYNTHROID, LEVOTHROID) 75 MCG tablet, Take 75 mcg by mouth daily before breakfast., Disp: , Rfl:  .  losartan (COZAAR) 50 MG tablet, TAKE  1 TABLET BY MOUTH AT BEDTIME, Disp: 30 tablet, Rfl: 0 .  Multiple Vitamins-Minerals (ALIVE ONCE DAILY WOMENS 50+ PO), Take 1 tablet by mouth daily., Disp: , Rfl:  .  omeprazole (PRILOSEC) 20 MG capsule, Take 20 mg by mouth daily. , Disp: , Rfl:  .  potassium chloride (K-DUR) 10 MEQ tablet, Take 2 tablets by mouth once daily, Disp: 60 tablet, Rfl: 0 .  spironolactone (ALDACTONE) 25 MG tablet, Take 1 tablet (25 mg total) by mouth every other day., Disp: 30 tablet, Rfl: 5 .  torsemide (DEMADEX) 20 MG tablet, Take 2 tablets (40 mg total) by mouth daily., Disp: 60 tablet, Rfl: 3 .  Travoprost, BAK Free, (TRAVATAN)  0.004 % SOLN ophthalmic solution, Place 1 drop into both eyes at bedtime. , Disp: , Rfl:  .  CHONDROITIN SULFATE PO, Take 250 mg by mouth daily., Disp: , Rfl:  .  colchicine 0.6 MG tablet, TAKE 1 TABLET BY MOUTH EVERY OTHER DAY TO PREVENT FLARES, Disp: , Rfl:  .  nitroGLYCERIN (NITROSTAT) 0.4 MG SL tablet, DISSOLVE ONE TABLET UNDER THE TONGUE EVERY 5 MINUTES AS NEEDED FOR CHEST PAIN. (Patient not taking: Reported on 06/04/2019), Disp: 25 tablet, Rfl: 3 Allergies  Allergen Reactions  . Other Other (See Comments)    Blood products- Patient won't accept any  . Sulfa Antibiotics Rash  . Uloric [Febuxostat] Rash     Social History   Socioeconomic History  . Marital status: Widowed    Spouse name: Not on file  . Number of children: 3  . Years of education: Not on file  . Highest education level: Some college, no degree  Occupational History  . Occupation: retired  Tobacco Use  . Smoking status: Former Smoker    Packs/day: 1.00    Years: 22.00    Pack years: 22.00    Types: Cigarettes    Quit date: 1983    Years since quitting: 38.0  . Smokeless tobacco: Never Used  Substance and Sexual Activity  . Alcohol use: Yes    Comment: 05/22/2018 "glass of wine couple times/wk"  . Drug use: Not Currently  . Sexual activity: Not Currently    Birth control/protection: None, Post-menopausal  Other Topics Concern  . Not on file  Social History Narrative  . Not on file   Social Determinants of Health   Financial Resource Strain:   . Difficulty of Paying Living Expenses: Not on file  Food Insecurity:   . Worried About Charity fundraiser in the Last Year: Not on file  . Ran Out of Food in the Last Year: Not on file  Transportation Needs:   . Lack of Transportation (Medical): Not on file  . Lack of Transportation (Non-Medical): Not on file  Physical Activity:   . Days of Exercise per Week: Not on file  . Minutes of Exercise per Session: Not on file  Stress:   . Feeling of Stress : Not  on file  Social Connections:   . Frequency of Communication with Friends and Family: Not on file  . Frequency of Social Gatherings with Friends and Family: Not on file  . Attends Religious Services: Not on file  . Active Member of Clubs or Organizations: Not on file  . Attends Archivist Meetings: Not on file  . Marital Status: Not on file  Intimate Partner Violence:   . Fear of Current or Ex-Partner: Not on file  . Emotionally Abused: Not on file  . Physically Abused: Not  on file  . Sexually Abused: Not on file    Physical Exam Vitals reviewed.  HENT:     Head: Normocephalic.     Nose: Nose normal.     Mouth/Throat:     Mouth: Mucous membranes are moist.  Eyes:     Pupils: Pupils are equal, round, and reactive to light.  Cardiovascular:     Rate and Rhythm: Normal rate and regular rhythm.     Pulses: Normal pulses.     Heart sounds: Normal heart sounds.  Pulmonary:     Effort: Pulmonary effort is normal.     Breath sounds: Normal breath sounds.  Abdominal:     Palpations: Abdomen is soft.  Musculoskeletal:        General: Normal range of motion.     Cervical back: Normal range of motion.     Right lower leg: Edema present.     Left lower leg: Edema present.     Comments: Patient has been on her feet all day.   Skin:    General: Skin is warm and dry.     Capillary Refill: Capillary refill takes less than 2 seconds.  Neurological:     Mental Status: She is alert. Mental status is at baseline.  Psychiatric:        Mood and Affect: Mood normal.     Arrived on scene to find Danielle [name] who was arriving home after being out grocery shopping and picking up prescriptions. Patient stated she has been walking a lot today and is "worn out". Patient did not have severe increase in shortness of breath walking from car to apartment. Patient seated herself and vitals were obtained and are as noted. Patient's pills were reviewed and confirmed. Pill box was filled  accordingly. Patient reports having ongoing issues with inner ear problems including dizziness. Patient stated her PCP is aware and is following. Danielle [name] agreed to home visit with [facility] upon her return to work. Home visit complete.   MEDS ORDERED-  Atorvastatin   Weight today- 207lbs     Future Appointments  Date Time Provider [place]  07/14/2019  9:50 AM Shamleffer, Melanie Crazier, MD [facility] None  09/01/2019  7:20 AM [facility] DEVICE REMOTES [facility]  12/01/2019  7:20 AM [facility] DEVICE REMOTES [facility]  03/01/2020  7:20 AM [facility] DEVICE REMOTES [facility]     ACTION: Home visit completed

## 2019-06-12 ENCOUNTER — Other Ambulatory Visit (HOSPITAL_COMMUNITY): Payer: Self-pay

## 2019-06-12 NOTE — Progress Notes (Signed)
Paramedicine Encounter    Patient ID: Danielle [name], female    DOB: September 08, 1945, 74 y.o.   MRN: [id number]    Patient Care Team: Seward Carol, MD as PCP - General (Internal Medicine) Jorge Ny, LCSW as Social Worker (Licensed Clinical Social Worker)  Patient Active Problem List   Diagnosis Date Noted  . Type 2 diabetes mellitus with hyperglycemia, with long-term current use of insulin ([place]) 05/14/2019  . Diabetes mellitus ([place]) 05/14/2019  . 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, with long-term current use of insulin ([place]) 05/14/2019  . Type 2 diabetes mellitus with diabetic polyneuropathy, with long-term current use of insulin ([place]) 05/14/2019  . Encephalopathy acute 05/20/2018  . 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 Acute kidney failure ([place]) 05/15/2018  . Primary localized osteoarthritis of right knee 05/13/2018  . (HFpEF) heart failure with preserved ejection fraction ([place]) 12/20/2017  . Obesity (BMI 30-39.9) 11/11/2017  . OSA (obstructive sleep apnea) 02/18/2017  . Essential hypertension   . Diabetes mellitus without complication ([place])   . Cancer ([place])   . Thyroid disease     Current Outpatient Medications:  .  allopurinol (ZYLOPRIM) 100 MG tablet, Take 1 tablet (100 mg total) by mouth daily., Disp: , Rfl:  .  apixaban (ELIQUIS) 5 MG TABS tablet, Take 1 tablet (5 mg total) by mouth 2 (two) times daily., Disp: 180 tablet, Rfl: 3 .  atorvastatin (LIPITOR) 80 MG tablet, Take 1 tablet (80 mg total) by mouth at bedtime., Disp: 30 tablet, Rfl: 5 .  carvedilol (COREG) 25 MG tablet, TAKE 1 TABLET BY MOUTH TWICE DAILY WITH MEALS, Disp: 60 tablet, Rfl: 0 .  ferrous sulfate 325 (65 FE) MG tablet, Take 325 mg by mouth 2 (two) times daily with a meal., Disp: , Rfl:  .  gabapentin (NEURONTIN) 300 MG capsule, Take 300 mg by mouth daily. , Disp: , Rfl:  .  isosorbide mononitrate (IMDUR) 30 MG 24 hr tablet, Take 1 tablet by mouth once  daily, Disp: 30 tablet, Rfl: 0 .  JARDIANCE 10 MG TABS tablet, Take 1 tablet by mouth once daily, Disp: 90 tablet, Rfl: 0 .  levothyroxine (SYNTHROID, LEVOTHROID) 75 MCG tablet, Take 75 mcg by mouth daily before breakfast., Disp: , Rfl:  .  losartan (COZAAR) 50 MG tablet, TAKE 1 TABLET BY MOUTH AT BEDTIME, Disp: 30 tablet, Rfl: 0 .  Multiple Vitamins-Minerals (ALIVE ONCE DAILY WOMENS 50+ PO), Take 1 tablet by mouth daily., Disp: , Rfl:  .  omeprazole (PRILOSEC) 20 MG capsule, Take 20 mg by mouth daily. , Disp: , Rfl:  .  potassium chloride (K-DUR) 10 MEQ tablet, Take 2 tablets by mouth once daily, Disp: 60 tablet, Rfl: 0 .  spironolactone (ALDACTONE) 25 MG tablet, Take 1 tablet (25 mg total) by mouth every other day., Disp: 30 tablet, Rfl: 5 .  torsemide (DEMADEX) 20 MG tablet, Take 2 tablets (40 mg total) by mouth daily., Disp: 60 tablet, Rfl: 3 .  acetaminophen (TYLENOL) 500 MG tablet, Take 500-1,000 mg by mouth every 6 (six) hours as needed (for pain or headaches)., Disp: , Rfl:  .  Biotin 10 MG CAPS, Take 10 mg by mouth daily., Disp: , Rfl:  .  Blood Glucose Monitoring Suppl (ACCU-CHEK AVIVA) device, by Other route. Use as instructed, Disp: , Rfl:  .  calcium citrate (CALCITRATE - DOSED IN MG ELEMENTAL CALCIUM) 950 MG tablet, Take 1 tablet by mouth 2 (two) times daily. , Disp: ,  Rfl:  .  CHONDROITIN SULFATE PO, Take 250 mg by mouth daily., Disp: , Rfl:  .  clotrimazole-betamethasone (LOTRISONE) lotion, APPLY LOTION TOPICALLY TWICE DAILY FOR 7 DAYS, Disp: , Rfl:  .  colchicine 0.6 MG tablet, TAKE 1 TABLET BY MOUTH EVERY OTHER DAY TO PREVENT FLARES, Disp: , Rfl:  .  glucose blood test strip, Use as instructed to test blood sugars 3 times daily E11.65  Freestyle lite, Disp: 400 each, Rfl: 3 .  insulin glargine (LANTUS) 100 UNIT/ML injection, Inject 80 Units into the skin daily., Disp: , Rfl:  .  Insulin Lispro (HUMALOG KWIKPEN) 200 UNIT/ML SOPN, Inject 20 Units into the skin 3 (three) times  daily., Disp: 27 mL, Rfl: 3 .  Insulin Pen Needle 32G X 4 MM MISC, 1 Device by Does not apply route 4 (four) times daily., Disp: 400 each, Rfl: 3 .  nitroGLYCERIN (NITROSTAT) 0.4 MG SL tablet, DISSOLVE ONE TABLET UNDER THE TONGUE EVERY 5 MINUTES AS NEEDED FOR CHEST PAIN. (Patient not taking: Reported on 06/04/2019), Disp: 25 tablet, Rfl: 3 .  Travoprost, BAK Free, (TRAVATAN) 0.004 % SOLN ophthalmic solution, Place 1 drop into both eyes at bedtime. , Disp: , Rfl:  Allergies  Allergen Reactions  . Other Other (See Comments)    Blood products- Patient won't accept any  . Sulfa Antibiotics Rash  . Uloric [Febuxostat] Rash     Social History   Socioeconomic History  . Marital status: Widowed    Spouse name: Not on file  . Number of children: 3  . Years of education: Not on file  . Highest education level: Some college, no degree  Occupational History  . Occupation: retired  Tobacco Use  . Smoking status: Former Smoker    Packs/day: 1.00    Years: 22.00    Pack years: 22.00    Types: Cigarettes    Quit date: 1983    Years since quitting: 38.0  . Smokeless tobacco: Never Used  Substance and Sexual Activity  . Alcohol use: Yes    Comment: 05/22/2018 "glass of wine couple times/wk"  . Drug use: Not Currently  . Sexual activity: Not Currently    Birth control/protection: None, Post-menopausal  Other Topics Concern  . Not on file  Social History Narrative  . Not on file   Social Determinants of Health   Financial Resource Strain:   . Difficulty of Paying Living Expenses: Not on file  Food Insecurity:   . Worried About Charity fundraiser in the Last Year: Not on file  . Ran Out of Food in the Last Year: Not on file  Transportation Needs:   . Lack of Transportation (Medical): Not on file  . Lack of Transportation (Non-Medical): Not on file  Physical Activity:   . Days of Exercise per Week: Not on file  . Minutes of Exercise per Session: Not on file  Stress:   . Feeling of  Stress : Not on file  Social Connections:   . Frequency of Communication with Friends and Family: Not on file  . Frequency of Social Gatherings with Friends and Family: Not on file  . Attends Religious Services: Not on file  . Active Member of Clubs or Organizations: Not on file  . Attends Archivist Meetings: Not on file  . Marital Status: Not on file  Intimate Partner Violence:   . Fear of Current or Ex-Partner: Not on file  . Emotionally Abused: Not on file  . Physically Abused: Not  on file  . Sexually Abused: Not on file    Physical Exam Pulmonary:     Effort: No respiratory distress.     Breath sounds: No wheezing or rales.  Musculoskeletal:        General: No swelling.  Skin:    General: Skin is warm and dry.         Future Appointments  Date Time Provider [place]  07/14/2019  9:50 AM Shamleffer, Melanie Crazier, MD [facility] None  09/01/2019  7:20 AM [facility] DEVICE REMOTES [facility]  12/01/2019  7:20 AM [facility] DEVICE REMOTES [facility]  03/01/2020  7:20 AM [facility] DEVICE REMOTES [facility]     BP 102/60 (BP Location: Right Arm, Patient Position: Sitting, Cuff Size: Large)   Pulse 76   Resp 15   Wt 210 lb (95.3 kg)   SpO2 98%   BMI 37.20 kg/m   Weight yesterday-didn't weigh Last visit weight-207   ATF pt CAO x4 sitting in the living room c/o right ear pain. She said that she has an appointment next week with the ear doctor.  She denies sob, chest pain and dizziness.  She's taken her medications for the past week.  Her CBG is 407, she said that she's going to take 20 units insulin "in a few mins".  I refilled her pill box so that she has enough meds for the next two weeks. I advised her to call if she has any issues or concerns.    (exteria otitis) right ear diagnosis per pt   Medication ordered: atorvastin filled untilo fri (1st week) Calcium (over the counter)  Shellia Hartl,  EMT Paramedic [phone] 06/12/2019    ACTION: Home visit completed

## 2019-06-18 DIAGNOSIS — B369 Superficial mycosis, unspecified: Secondary | ICD-10-CM | POA: Diagnosis not present

## 2019-06-18 DIAGNOSIS — R0981 Nasal congestion: Secondary | ICD-10-CM | POA: Diagnosis not present

## 2019-06-18 DIAGNOSIS — L299 Pruritus, unspecified: Secondary | ICD-10-CM | POA: Diagnosis not present

## 2019-06-18 DIAGNOSIS — H6240 Otitis externa in other diseases classified elsewhere, unspecified ear: Secondary | ICD-10-CM | POA: Diagnosis not present

## 2019-06-25 ENCOUNTER — Other Ambulatory Visit (HOSPITAL_COMMUNITY): Payer: Self-pay

## 2019-06-25 NOTE — Progress Notes (Signed)
Paramedicine Encounter    Patient ID: Danielle [name], female    DOB: April 24, 1946, 74 y.o.   MRN: [id number]    Patient Care Team: Seward Carol, MD as PCP - General (Internal Medicine) Jorge Ny, LCSW as Social Worker (Licensed Clinical Social Worker)  Patient Active Problem List   Diagnosis Date Noted  . Type 2 diabetes mellitus with hyperglycemia, with long-term current use of insulin ([place]) 05/14/2019  . Diabetes mellitus ([place]) 05/14/2019  . 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, with long-term current use of insulin ([place]) 05/14/2019  . Type 2 diabetes mellitus with diabetic polyneuropathy, with long-term current use of insulin ([place]) 05/14/2019  . Encephalopathy acute 05/20/2018  . 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 Acute kidney failure ([place]) 05/15/2018  . Primary localized osteoarthritis of right knee 05/13/2018  . (HFpEF) heart failure with preserved ejection fraction ([place]) 12/20/2017  . Obesity (BMI 30-39.9) 11/11/2017  . OSA (obstructive sleep apnea) 02/18/2017  . Essential hypertension   . Diabetes mellitus without complication ([place])   . Cancer ([place])   . Thyroid disease     Current Outpatient Medications:  .  acetaminophen (TYLENOL) 500 MG tablet, Take 500-1,000 mg by mouth every 6 (six) hours as needed (for pain or headaches)., Disp: , Rfl:  .  allopurinol (ZYLOPRIM) 100 MG tablet, Take 1 tablet (100 mg total) by mouth daily., Disp: , Rfl:  .  apixaban (ELIQUIS) 5 MG TABS tablet, Take 1 tablet (5 mg total) by mouth 2 (two) times daily., Disp: 180 tablet, Rfl: 3 .  atorvastatin (LIPITOR) 80 MG tablet, Take 1 tablet (80 mg total) by mouth at bedtime., Disp: 30 tablet, Rfl: 5 .  Biotin 10 MG CAPS, Take 10 mg by mouth daily., Disp: , Rfl:  .  Blood Glucose Monitoring Suppl (ACCU-CHEK AVIVA) device, by Other route. Use as instructed, Disp: , Rfl:  .  calcium citrate (CALCITRATE - DOSED IN MG ELEMENTAL CALCIUM) 950 MG  tablet, Take 1 tablet by mouth 2 (two) times daily. , Disp: , Rfl:  .  carvedilol (COREG) 25 MG tablet, TAKE 1 TABLET BY MOUTH TWICE DAILY WITH MEALS, Disp: 60 tablet, Rfl: 0 .  CHONDROITIN SULFATE PO, Take 250 mg by mouth daily., Disp: , Rfl:  .  clotrimazole-betamethasone (LOTRISONE) lotion, APPLY LOTION TOPICALLY TWICE DAILY FOR 7 DAYS, Disp: , Rfl:  .  colchicine 0.6 MG tablet, TAKE 1 TABLET BY MOUTH EVERY OTHER DAY TO PREVENT FLARES, Disp: , Rfl:  .  ferrous sulfate 325 (65 FE) MG tablet, Take 325 mg by mouth 2 (two) times daily with a meal., Disp: , Rfl:  .  gabapentin (NEURONTIN) 300 MG capsule, Take 300 mg by mouth daily. , Disp: , Rfl:  .  glucose blood test strip, Use as instructed to test blood sugars 3 times daily E11.65  Freestyle lite, Disp: 400 each, Rfl: 3 .  insulin glargine (LANTUS) 100 UNIT/ML injection, Inject 80 Units into the skin daily., Disp: , Rfl:  .  Insulin Lispro (HUMALOG KWIKPEN) 200 UNIT/ML SOPN, Inject 20 Units into the skin 3 (three) times daily., Disp: 27 mL, Rfl: 3 .  Insulin Pen Needle 32G X 4 MM MISC, 1 Device by Does not apply route 4 (four) times daily., Disp: 400 each, Rfl: 3 .  isosorbide mononitrate (IMDUR) 30 MG 24 hr tablet, Take 1 tablet by mouth once daily, Disp: 30 tablet, Rfl: 0 .  JARDIANCE 10 MG TABS tablet, Take 1 tablet  by mouth once daily, Disp: 90 tablet, Rfl: 0 .  levothyroxine (SYNTHROID, LEVOTHROID) 75 MCG tablet, Take 75 mcg by mouth daily before breakfast., Disp: , Rfl:  .  losartan (COZAAR) 50 MG tablet, TAKE 1 TABLET BY MOUTH AT BEDTIME, Disp: 30 tablet, Rfl: 0 .  Multiple Vitamins-Minerals (ALIVE ONCE DAILY WOMENS 50+ PO), Take 1 tablet by mouth daily., Disp: , Rfl:  .  nitroGLYCERIN (NITROSTAT) 0.4 MG SL tablet, DISSOLVE ONE TABLET UNDER THE TONGUE EVERY 5 MINUTES AS NEEDED FOR CHEST PAIN. (Patient not taking: Reported on 06/04/2019), Disp: 25 tablet, Rfl: 3 .  omeprazole (PRILOSEC) 20 MG capsule, Take 20 mg by mouth daily. , Disp: , Rfl:   .  potassium chloride (K-DUR) 10 MEQ tablet, Take 2 tablets by mouth once daily, Disp: 60 tablet, Rfl: 0 .  spironolactone (ALDACTONE) 25 MG tablet, Take 1 tablet (25 mg total) by mouth every other day., Disp: 30 tablet, Rfl: 5 .  torsemide (DEMADEX) 20 MG tablet, Take 2 tablets (40 mg total) by mouth daily., Disp: 60 tablet, Rfl: 3 .  Travoprost, BAK Free, (TRAVATAN) 0.004 % SOLN ophthalmic solution, Place 1 drop into both eyes at bedtime. , Disp: , Rfl:  Allergies  Allergen Reactions  . Other Other (See Comments)    Blood products- Patient won't accept any  . Sulfa Antibiotics Rash  . Uloric [Febuxostat] Rash     Social History   Socioeconomic History  . Marital status: Widowed    Spouse name: Not on file  . Number of children: 3  . Years of education: Not on file  . Highest education level: Some college, no degree  Occupational History  . Occupation: retired  Tobacco Use  . Smoking status: Former Smoker    Packs/day: 1.00    Years: 22.00    Pack years: 22.00    Types: Cigarettes    Quit date: 1983    Years since quitting: 38.1  . Smokeless tobacco: Never Used  Substance and Sexual Activity  . Alcohol use: Yes    Comment: 05/22/2018 "glass of wine couple times/wk"  . Drug use: Not Currently  . Sexual activity: Not Currently    Birth control/protection: None, Post-menopausal  Other Topics Concern  . Not on file  Social History Narrative  . Not on file   Social Determinants of Health   Financial Resource Strain:   . Difficulty of Paying Living Expenses: Not on file  Food Insecurity:   . Worried About Charity fundraiser in the Last Year: Not on file  . Ran Out of Food in the Last Year: Not on file  Transportation Needs:   . Lack of Transportation (Medical): Not on file  . Lack of Transportation (Non-Medical): Not on file  Physical Activity:   . Days of Exercise per Week: Not on file  . Minutes of Exercise per Session: Not on file  Stress:   . Feeling of  Stress : Not on file  Social Connections:   . Frequency of Communication with Friends and Family: Not on file  . Frequency of Social Gatherings with Friends and Family: Not on file  . Attends Religious Services: Not on file  . Active Member of Clubs or Organizations: Not on file  . Attends Archivist Meetings: Not on file  . Marital Status: Not on file  Intimate Partner Violence:   . Fear of Current or Ex-Partner: Not on file  . Emotionally Abused: Not on file  . Physically Abused:  Not on file  . Sexually Abused: Not on file    Physical Exam Pulmonary:     Effort: Pulmonary effort is normal. No respiratory distress.     Breath sounds: No wheezing or rales.  Skin:    General: Skin is warm and dry.         Future Appointments  Date Time Provider [place]  07/14/2019  9:50 AM Shamleffer, Melanie Crazier, MD [facility] None  09/01/2019  7:20 AM [facility] DEVICE REMOTES [facility]  12/01/2019  7:20 AM [facility] DEVICE REMOTES [facility]  03/01/2020  7:20 AM [facility] DEVICE REMOTES [facility]     There were no vitals taken for this visit.  Weight yesterday-she doesn't remember Last visit weight-210 (06/12/19)   ATF pt CAO x4 sitting in living room watching tv. She has no complaints today, she's taken all of her medications. She's missing the Wednesday med slot.  She doesn't have another pill box. She's out of several medications or will be out before next visit. She denies sob, chest pain and dizziness.  She's gained about 10lbs since 06/12/19. She said that she doesn't eat food with salt, however She has been eating out this week.  [facility] will [facility] about the weight gain.  rx bottles verified and pill box refilled.    Medications needed for next visit.  Atorvastatin completely out Gabapentin filled until wed morn Isosorbide losartatin Potassium filled untilsat   Emilly Lavey, EMT Paramedic [phone] 06/25/2019    ACTION: Home visit completed

## 2019-07-03 ENCOUNTER — Other Ambulatory Visit (HOSPITAL_COMMUNITY): Payer: Self-pay | Admitting: Internal Medicine

## 2019-07-03 ENCOUNTER — Other Ambulatory Visit: Payer: Self-pay

## 2019-07-03 ENCOUNTER — Other Ambulatory Visit (HOSPITAL_COMMUNITY): Payer: Self-pay | Admitting: Adult Health

## 2019-07-06 ENCOUNTER — Encounter: Payer: Self-pay | Admitting: Internal Medicine

## 2019-07-06 ENCOUNTER — Ambulatory Visit: Payer: Medicare HMO | Admitting: Internal Medicine

## 2019-07-06 ENCOUNTER — Other Ambulatory Visit: Payer: Self-pay

## 2019-07-06 VITALS — BP 124/68 | HR 80 | Temp 98.8°F | Ht 63.0 in | Wt 216.0 lb

## 2019-07-06 DIAGNOSIS — Z794 Long term (current) use of insulin: Secondary | ICD-10-CM | POA: Diagnosis not present

## 2019-07-06 DIAGNOSIS — E1165 Type 2 diabetes mellitus with hyperglycemia: Secondary | ICD-10-CM

## 2019-07-06 LAB — GLUCOSE, POCT (MANUAL RESULT ENTRY): POC Glucose: 359 mg/dl — AB (ref 70–99)

## 2019-07-06 MED ORDER — HUMALOG KWIKPEN 200 UNIT/ML ~~LOC~~ SOPN: 24.0000 [IU] | PEN_INJECTOR | Freq: Three times a day (TID) | SUBCUTANEOUS | 3 refills | Status: DC

## 2019-07-06 NOTE — Progress Notes (Signed)
Name: Danielle [name]  Age/ Sex: 74 y.o., female   MRN/ DOB: [id number], [birth year]     PCP: Seward Carol, MD   Reason for Endocrinology Evaluation: Type 2 Diabetes Mellitus  Initial Endocrine Consultative Visit: 05/14/2019    PATIENT IDENTIFIER: Danielle [name] is a 73 y.o. female with a past medical history of T2DM, HTN, Dyslipidemia, CAD, Hx of Breast Ca (S/P chemo and lumpectomy)  . The patient has followed with Endocrinology clinic since 05/14/2019 for consultative assistance with management of her diabetes.  DIABETIC HISTORY:  Danielle [name] was diagnosed with T2DM > 20 yrs ago. Has been on  Metformin in the past which caused diarrhea, has been on prandial insulin in the past  . Her hemoglobin A1c has ranged from 6.9% in 2019, peaking at 8.0% in 2020.  On her initial visit to our clinic her A1c was 11.2% . She was on Jardiance and Lantus 130 units daily. I reduced her lantus to 80 units, added prandial insulin. I did not stop her Jardiance, but I did  strongly recommend that her SGLT-2 inhibitor be discontinued due to CKD III  And deferred this to cardiology as it was started by them.   SUBJECTIVE:   During the last visit (05/14/2019): A1c 11.2%. We decreased her lantus and start Humalog   Today (07/07/2019): Danielle [name] is here for a follow up on diabetes due to hyperglycemia. She checks her blood sugars 1 times daily. The patient has not had hypoglycemic episodes since the last clinic visit. Otherwise, the patient has not required any recent emergency interventions for hypoglycemia and has had recent hospitalizations secondary to hyper or hypoglycemic episodes.    ROS: As per HPI and as detailed below:    HOME DIABETES REGIMEN:   Lantus to 80 units daily   Humalog 20 units with each meal    Statin: Yes ACE-I/ARB:Yes Prior Diabetic Education: yes  METER DOWNLOAD SUMMARY: Date range evaluated: 1/26-07/06/2019 Fingerstick Blood Glucose Tests = 12 Average Number Tests/Day =  0.9 Overall Mean FS Glucose = 223  BG Ranges: Low = 139 High = 338   Hypoglycemic Events/30 Days: BG < 50 =0 Episodes of symptomatic severe hypoglycemia = 0    DIABETIC COMPLICATIONS: Microvascular complications:   Neuropathy, CKD III, retinopathy (S/P laser)   Last eye exam: Completed 04/2019  Macrovascular complications:   CAD ( S/P MI and stent placement)  Denies: PVD, CVA    HISTORY:  Past Medical History:  Past Medical History:  Diagnosis Date  . Acute combined systolic and diastolic congestive heart failure ([place]) 02/05/2017  . 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 Acute kidney failure ([place]) 05/15/2018  . Arthritis    "all over" (05/22/2018)  . Blood dyscrasia    per pt-has small blood cells-appears as if anemic  . Breast cancer, left breast ([place]) 1985  . CHF (congestive heart failure) ([place])   . Complication of anesthesia    difficult to awaken from per pt  . Coronary artery disease   . Dyspnea   . Essential hypertension   . Heart disease   . Heart murmur   . History of gout   . Hypothyroidism   . Obesity (BMI 30-39.9) 11/11/2017  . OSA (obstructive sleep apnea) 02/18/2017    severe obstructive sleep apnea with an AHI of 75.6/h and mild central sleep apnea with a CAI of 7.7/h.  Oxygen saturations dropped as low as 82%.   He is on CPAP at  9 cm H2O.  . OSA on CPAP   . Personal history of chemotherapy   . Personal history of radiation therapy   . Presence of permanent cardiac pacemaker   . Primary localized osteoarthritis of right knee 05/13/2018  . Refusal of blood transfusions as patient is Jehovah's Witness   . Type II diabetes mellitus ([place])    Past Surgical History:  Past Surgical History:  Procedure Laterality Date  . BREAST BIOPSY Left 1985  . BREAST LUMPECTOMY WITH NEEDLE LOCALIZATION AND AXILLARY LYMPH NODE DISSECTION  1985  . CATARACT EXTRACTION W/ INTRAOCULAR LENS  IMPLANT, BILATERAL Bilateral   .  CORONARY ANGIOPLASTY WITH STENT PLACEMENT  2010, 2012,2017    2 done 2010 and 1 replaced 2012 and 2 replaced in 2017  . DILATION AND CURETTAGE OF UTERUS    . INSERT / REPLACE / REMOVE PACEMAKER  07/19/2014  . JOINT REPLACEMENT    . REPLACEMENT TOTAL KNEE Left 09/2012  . THYROIDECTOMY, PARTIAL  1978  . TONSILLECTOMY    . TOTAL KNEE ARTHROPLASTY Right 05/13/2018   Procedure: TOTAL KNEE ARTHROPLASTY;  Surgeon: Marchia Bond, MD;  Location: [facility];  Service: Orthopedics;  Laterality: Right;  . TUBAL LIGATION      Social History:  reports that she quit smoking about 38 years ago. Her smoking use included cigarettes. She has a 22.00 pack-year smoking history. She has never used smokeless tobacco. She reports current alcohol use. She reports previous drug use. Family History:  Family History  Problem Relation Age of Onset  . Multiple myeloma Mother   . Heart disease Father   . Other Sister   . Heart disease Brother   . Diabetes Sister   . Other Sister      HOME MEDICATIONS: Allergies as of 07/06/2019      Reactions   Other Other (See Comments)   Blood products- Patient won't accept any   Sulfa Antibiotics Rash   Uloric [febuxostat] Rash      Medication List       Accurate as of July 06, 2019 11:59 PM. If you have any questions, ask your nurse or doctor.        Accu-Chek Aviva device by Other route. Use as instructed   acetaminophen 500 MG tablet Commonly known as: TYLENOL Take 500-1,000 mg by mouth every 6 (six) hours as needed (for pain or headaches).   ALIVE ONCE DAILY WOMENS 50+ PO Take 1 tablet by mouth daily.   allopurinol 100 MG tablet Commonly known as: ZYLOPRIM Take 1 tablet (100 mg total) by mouth daily.   apixaban 5 MG Tabs tablet Commonly known as: Eliquis Take 1 tablet (5 mg total) by mouth 2 (two) times daily.   atorvastatin 80 MG tablet Commonly known as: LIPITOR Take 1 tablet (80 mg total) by mouth at bedtime.   Biotin 10 MG Caps Take 10 mg by  mouth daily.   calcium citrate 950 (200 Ca) MG tablet Commonly known as: CALCITRATE - dosed in mg elemental calcium Take 1 tablet by mouth 2 (two) times daily.   carvedilol 25 MG tablet Commonly known as: COREG TAKE 1 TABLET BY MOUTH TWICE DAILY WITH MEALS   CHONDROITIN SULFATE PO Take 250 mg by mouth daily.   clotrimazole-betamethasone lotion Commonly known as: LOTRISONE APPLY LOTION TOPICALLY TWICE DAILY FOR 7 DAYS   colchicine 0.6 MG tablet TAKE 1 TABLET BY MOUTH EVERY OTHER DAY TO PREVENT FLARES   ferrous sulfate 325 (65 FE) MG tablet Take 325 mg  by mouth 2 (two) times daily with a meal.   gabapentin 300 MG capsule Commonly known as: NEURONTIN Take 300 mg by mouth daily.   glucose blood test strip Use as instructed to test blood sugars 3 times daily E11.65  Freestyle lite   HumaLOG KwikPen 200 UNIT/ML Sopn Generic drug: Insulin Lispro Inject 24 Units into the skin 3 (three) times daily. What changed: how much to take Changed by: Dorita Sciara, MD   Insulin Pen Needle 32G X 4 MM Misc 1 Device by Does not apply route 4 (four) times daily.   isosorbide mononitrate 30 MG 24 hr tablet Commonly known as: IMDUR Take 1 tablet by mouth once daily   Jardiance 10 MG Tabs tablet Generic drug: empagliflozin Take 1 tablet by mouth once daily   Lantus 100 UNIT/ML injection Generic drug: insulin glargine Inject 88 Units into the skin daily.   levothyroxine 75 MCG tablet Commonly known as: SYNTHROID Take 75 mcg by mouth daily before breakfast.   losartan 50 MG tablet Commonly known as: COZAAR Take 1 tablet (50 mg total) by mouth at bedtime. Please call for OV [phone]   nitroGLYCERIN 0.4 MG SL tablet Commonly known as: NITROSTAT DISSOLVE ONE TABLET UNDER THE TONGUE EVERY 5 MINUTES AS NEEDED FOR CHEST PAIN.   omeprazole 20 MG capsule Commonly known as: PRILOSEC Take 20 mg by mouth daily.   potassium chloride 10 MEQ tablet Commonly known as:  KLOR-CON Take 2 tablets by mouth once daily   spironolactone 25 MG tablet Commonly known as: ALDACTONE Take 1 tablet (25 mg total) by mouth every other day.   torsemide 20 MG tablet Commonly known as: DEMADEX Take 2 tablets (40 mg total) by mouth daily.   Travoprost (BAK Free) 0.004 % Soln ophthalmic solution Commonly known as: TRAVATAN Place 1 drop into both eyes at bedtime.        OBJECTIVE:   Vital Signs: BP 124/68 (BP Location: Right Arm, Patient Position: Sitting, Cuff Size: Large)   Pulse 80   Temp 98.8 F (37.1 C)   Ht 5' 3"  (1.6 m)   Wt 216 lb (98 kg)   SpO2 98%   BMI 38.26 kg/m   Wt Readings from Last 3 Encounters:  07/06/19 216 lb (98 kg)  06/25/19 220 lb (99.8 kg)  06/12/19 210 lb (95.3 kg)    DATA REVIEWED:  Lab Results  Component Value Date   HGBA1C 6.9 (H) 05/20/2018   Lab Results  Component Value Date   LDLCALC 24 03/06/2017   CREATININE 1.68 (H) 03/27/2019   No results found for: [facility]   Lab Results  Component Value Date   CHOL 87 03/06/2017   HDL 38 (L) 03/06/2017   LDLCALC 24 03/06/2017   TRIG 123 03/06/2017   CHOLHDL 2.3 03/06/2017         ASSESSMENT / PLAN / RECOMMENDATIONS:   1)) Type 2 Diabetes Mellitus, Poorly controlled, With Neuropathic, CKDIII and Macrovascular  complications - Most recent A1c of 11.2 %. Goal A1c < 7.5 %.    -Danielle [name] has scheduled her appointment today due to concerns about hyperglycemia, during her visit she was very upset and angry to where she was raising her voice and pointingt her finger at me that I put her on this amount of insulin to help with her glycemic control yet she continues with hyperglycemia, she is also very concerned about her chronic kidney disease, she was under the impression that insulin is adversely affecting her body  and her organs. -I have had a long discussion with Danielle [name] today I explained that CKD is multifactorial could be related to diabetes, hypertension, aging, and  hereditary conditions, we did discuss the insulin regimen is the safest thing for her in the setting of CKD 3. -I also explained to Danielle [name] that I have only met her once prior to today's visit and there was no way for me to know exactly how much insulin she would require from the first time I met her, I explained insulin resistance and how this is variable from one individual to the other, and that we do not have a way to measure insulin resistance and this is how we come up with the Lantus dose of 80 units (of note the patient was on 130 units daily) as well as Humalog 20 units daily, patient under the impression that I told her to start Humalog with 10 units, so she has gradually alternating between 15 and 20 units.  In review of prescription records and AVS it has been clearly documented that the patient to start Humalog at 20 units. -Today she ate breakfast and took 15 units of Humalog with her meal, postprandial was 359 mg/dL.  This is due to insufficient prandial insulin intake. -She has requested to be on regular insulin, but I explained to her that will increase her risk of hypoglycemia due to the longer half-life compared to Humalog, especially in the setting of CKD III.  -Patient also was questioning why she is not on any oral glycemic agents, we again discussed that due to a GFR of 35 (based on labs from March 27, 2019) we have very limited in oral glycemic agents, I again have emphasized that MDI regimen is the safest thing at this time. -Today she declined to check glucose more than once a day, she also declined to be referred to an RD, she is under the impression that she is eating healthy diet and there should not be any concerns about her diet causing hypoglycemia. -I have offered the patient after the all the above explanations to increase her dose of Lantus and Humalog, which she agreed to at this time. -If her GFR continues to be persistently below 45, I will strongly recommend  discontinuing her Vania Rea, this was started by cardiology due to cardiovascular benefits.     MEDICATIONS: Lantus 88 units ONCE daily  Humalog 24 units with each meal  She is on Jardiance 10 mg daily per cardiology  EDUCATION / INSTRUCTIONS:  BG monitoring instructions: Patient is instructed to check her blood sugars 4 times a day, before each meal and bedtime.  Call [facility] if: BG persistently < 70 or > 300. . I reviewed the Rule of 15 for the treatment of hypoglycemia in detail with the patient. Literature supplied.  Of note I have reviewed the AVS information with the patient and she was able to read back her new doses of Lantus and Humalog    Over 35 minutes was spent with the patient        Signed electronically by: Mack Guise, MD  [facility] Endocrinology  [facility] [address] [place], [place] [address] Phone: [phone] FAX: [phone]   CC: Seward Carol, [address] [address] Phone: [phone]  Fax: [phone]  Return to Endocrinology clinic as below: Future Appointments  Date Time Provider [place]  09/01/2019  7:20 AM [facility] DEVICE REMOTES [facility]  12/01/2019  7:20 AM [facility] DEVICE REMOTES [facility]  03/01/2020  7:20 AM [facility] DEVICE REMOTES [facility]

## 2019-07-06 NOTE — Patient Instructions (Addendum)
-   Lantus 88 units ONCE daily  - Humalog 24 units with each meal   - Check sugar before each meal     HOW TO TREAT LOW BLOOD SUGARS (Blood sugar LESS THAN 70 MG/DL)  Please follow the RULE OF 15 for the treatment of hypoglycemia treatment (when your (blood sugars are less than 70 mg/dL)    STEP 1: Take 15 grams of carbohydrates when your blood sugar is low, which includes:   3-4 GLUCOSE TABS  OR  3-4 OZ OF JUICE OR REGULAR SODA OR  ONE TUBE OF GLUCOSE GEL     STEP 2: RECHECK blood sugar in 15 MINUTES STEP 3: If your blood sugar is still low at the 15 minute recheck --> then, go back to STEP 1 and treat AGAIN with another 15 grams of carbohydrates.

## 2019-07-07 ENCOUNTER — Encounter: Payer: Self-pay | Admitting: Internal Medicine

## 2019-07-07 ENCOUNTER — Telehealth (HOSPITAL_COMMUNITY): Payer: Self-pay

## 2019-07-07 NOTE — Telephone Encounter (Signed)
Spoke to [place] confirming home visit for Thursday at 3:30.

## 2019-07-07 NOTE — Telephone Encounter (Signed)
Spoke to [place] who agreed to Thursday 2/11 visit at 330 for med check, pill box fill and outreach. Call completed.

## 2019-07-09 ENCOUNTER — Other Ambulatory Visit (HOSPITAL_COMMUNITY): Payer: Self-pay | Admitting: Internal Medicine

## 2019-07-09 ENCOUNTER — Other Ambulatory Visit (HOSPITAL_COMMUNITY): Payer: Self-pay

## 2019-07-09 ENCOUNTER — Other Ambulatory Visit (HOSPITAL_COMMUNITY): Payer: Self-pay | Admitting: Adult Health

## 2019-07-09 NOTE — Progress Notes (Signed)
Paramedicine Encounter    Patient ID: Danielle [name], female    DOB: July 14, 1945, 74 y.o.   MRN: [id number]   Patient Care Team: Seward Carol, MD as PCP - General (Internal Medicine) Jorge Ny, LCSW as Social Worker (Licensed Clinical Social Worker)  Patient Active Problem List   Diagnosis Date Noted  . Type 2 diabetes mellitus with hyperglycemia, with long-term current use of insulin ([place]) 05/14/2019  . Diabetes mellitus ([place]) 05/14/2019  . 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, with long-term current use of insulin ([place]) 05/14/2019  . Type 2 diabetes mellitus with diabetic polyneuropathy, with long-term current use of insulin ([place]) 05/14/2019  . Encephalopathy acute 05/20/2018  . 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 Acute kidney failure ([place]) 05/15/2018  . Primary localized osteoarthritis of right knee 05/13/2018  . (HFpEF) heart failure with preserved ejection fraction ([place]) 12/20/2017  . Obesity (BMI 30-39.9) 11/11/2017  . OSA (obstructive sleep apnea) 02/18/2017  . Essential hypertension   . Diabetes mellitus without complication ([place])   . Cancer ([place])   . Thyroid disease     Current Outpatient Medications:  .  acetaminophen (TYLENOL) 500 MG tablet, Take 500-1,000 mg by mouth every 6 (six) hours as needed (for pain or headaches)., Disp: , Rfl:  .  allopurinol (ZYLOPRIM) 100 MG tablet, Take 1 tablet (100 mg total) by mouth daily., Disp: , Rfl:  .  apixaban (ELIQUIS) 5 MG TABS tablet, Take 1 tablet (5 mg total) by mouth 2 (two) times daily., Disp: 180 tablet, Rfl: 3 .  Biotin 10 MG CAPS, Take 10 mg by mouth daily., Disp: , Rfl:  .  Blood Glucose Monitoring Suppl (ACCU-CHEK AVIVA) device, by Other route. Use as instructed, Disp: , Rfl:  .  calcium citrate (CALCITRATE - DOSED IN MG ELEMENTAL CALCIUM) 950 MG tablet, Take 1 tablet by mouth 2 (two) times daily. , Disp: , Rfl:  .  clotrimazole-betamethasone (LOTRISONE) lotion,  APPLY LOTION TOPICALLY TWICE DAILY FOR 7 DAYS, Disp: , Rfl:  .  ferrous sulfate 325 (65 FE) MG tablet, Take 325 mg by mouth 2 (two) times daily with a meal., Disp: , Rfl:  .  gabapentin (NEURONTIN) 300 MG capsule, Take 300 mg by mouth daily. , Disp: , Rfl:  .  glucose blood test strip, Use as instructed to test blood sugars 3 times daily E11.65  Freestyle lite, Disp: 400 each, Rfl: 3 .  insulin glargine (LANTUS) 100 UNIT/ML injection, Inject 88 Units into the skin daily., Disp: , Rfl:  .  Insulin Lispro (HUMALOG KWIKPEN) 200 UNIT/ML SOPN, Inject 24 Units into the skin 3 (three) times daily., Disp: 27 mL, Rfl: 3 .  Insulin Pen Needle 32G X 4 MM MISC, 1 Device by Does not apply route 4 (four) times daily., Disp: 400 each, Rfl: 3 .  isosorbide mononitrate (IMDUR) 30 MG 24 hr tablet, Take 1 tablet by mouth once daily, Disp: 30 tablet, Rfl: 0 .  JARDIANCE 10 MG TABS tablet, Take 1 tablet by mouth once daily, Disp: 90 tablet, Rfl: 0 .  levothyroxine (SYNTHROID, LEVOTHROID) 75 MCG tablet, Take 75 mcg by mouth daily before breakfast., Disp: , Rfl:  .  losartan (COZAAR) 50 MG tablet, Take 1 tablet (50 mg total) by mouth at bedtime. Please call for OV [phone], Disp: 30 tablet, Rfl: 0 .  Multiple Vitamins-Minerals (ALIVE ONCE DAILY WOMENS 50+ PO), Take 1 tablet by mouth daily., Disp: , Rfl:  .  potassium chloride (  K-DUR) 10 MEQ tablet, Take 2 tablets by mouth once daily, Disp: 60 tablet, Rfl: 0 .  spironolactone (ALDACTONE) 25 MG tablet, Take 1 tablet (25 mg total) by mouth every other day., Disp: 30 tablet, Rfl: 5 .  torsemide (DEMADEX) 20 MG tablet, Take 2 tablets (40 mg total) by mouth daily., Disp: 60 tablet, Rfl: 3 .  Travoprost, BAK Free, (TRAVATAN) 0.004 % SOLN ophthalmic solution, Place 1 drop into both eyes at bedtime. , Disp: , Rfl:  .  atorvastatin (LIPITOR) 80 MG tablet, Take 1 tablet (80 mg total) by mouth at bedtime. (Patient not taking: Reported on 07/09/2019), Disp: 30 tablet, Rfl: 5 .   carvedilol (COREG) 25 MG tablet, TAKE 1 TABLET BY MOUTH TWICE DAILY WITH MEALS (Patient not taking: Reported on 07/09/2019), Disp: 60 tablet, Rfl: 0 .  CHONDROITIN SULFATE PO, Take 250 mg by mouth daily., Disp: , Rfl:  .  colchicine 0.6 MG tablet, TAKE 1 TABLET BY MOUTH EVERY OTHER DAY TO PREVENT FLARES, Disp: , Rfl:  .  nitroGLYCERIN (NITROSTAT) 0.4 MG SL tablet, DISSOLVE ONE TABLET UNDER THE TONGUE EVERY 5 MINUTES AS NEEDED FOR CHEST PAIN. (Patient not taking: Reported on 06/04/2019), Disp: 25 tablet, Rfl: 3 .  omeprazole (PRILOSEC) 20 MG capsule, Take 20 mg by mouth daily. , Disp: , Rfl:  Allergies  Allergen Reactions  . Other Other (See Comments)    Blood products- Patient won't accept any  . Sulfa Antibiotics Rash  . Uloric [Febuxostat] Rash     Social History   Socioeconomic History  . Marital status: Widowed    Spouse name: Not on file  . Number of children: 3  . Years of education: Not on file  . Highest education level: Some college, no degree  Occupational History  . Occupation: retired  Tobacco Use  . Smoking status: Former Smoker    Packs/day: 1.00    Years: 22.00    Pack years: 22.00    Types: Cigarettes    Quit date: 1983    Years since quitting: 38.1  . Smokeless tobacco: Never Used  Substance and Sexual Activity  . Alcohol use: Yes    Comment: 05/22/2018 "glass of wine couple times/wk"  . Drug use: Not Currently  . Sexual activity: Not Currently    Birth control/protection: None, Post-menopausal  Other Topics Concern  . Not on file  Social History Narrative  . Not on file   Social Determinants of Health   Financial Resource Strain:   . Difficulty of Paying Living Expenses: Not on file  Food Insecurity:   . Worried About Charity fundraiser in the Last Year: Not on file  . Ran Out of Food in the Last Year: Not on file  Transportation Needs:   . Lack of Transportation (Medical): Not on file  . Lack of Transportation (Non-Medical): Not on file  Physical  Activity:   . Days of Exercise per Week: Not on file  . Minutes of Exercise per Session: Not on file  Stress:   . Feeling of Stress : Not on file  Social Connections:   . Frequency of Communication with Friends and Family: Not on file  . Frequency of Social Gatherings with Friends and Family: Not on file  . Attends Religious Services: Not on file  . Active Member of Clubs or Organizations: Not on file  . Attends Archivist Meetings: Not on file  . Marital Status: Not on file  Intimate Partner Violence:   .  Fear of Current or Ex-Partner: Not on file  . Emotionally Abused: Not on file  . Physically Abused: Not on file  . Sexually Abused: Not on file    Physical Exam Vitals reviewed.  HENT:     Head: Normocephalic.     Nose: Nose normal.     Mouth/Throat:     Mouth: Mucous membranes are moist.  Eyes:     Pupils: Pupils are equal, round, and reactive to light.  Cardiovascular:     Rate and Rhythm: Normal rate and regular rhythm.     Pulses: Normal pulses.     Heart sounds: Normal heart sounds.  Pulmonary:     Effort: Pulmonary effort is normal.     Breath sounds: Normal breath sounds.  Abdominal:     General: Abdomen is flat.     Palpations: Abdomen is soft.  Musculoskeletal:        General: Normal range of motion.     Cervical back: Normal range of motion.     Right lower leg: No edema.     Left lower leg: No edema.  Skin:    General: Skin is warm and dry.     Capillary Refill: Capillary refill takes 2 to 3 seconds.  Neurological:     Mental Status: She is alert. Mental status is at baseline.  Psychiatric:        Mood and Affect: Mood normal.     Comments: Sad due to recent loss of friend.      Arrived for home visit for [place] who was walking around her home upon my arrival. Kibibi advised she has been fatigued and feeling down the last few days due to her learning of her best friend of 62 years passing away over the weekend. Todd stated she has had  a decreased appetite and has not been hydrating as well as she should. Patient's daughter brought bottled waters by her home while I was there, Delayla stated she has not had bottled water inside her home for a few days. Vitals were obtained and as noted with blood pressure being low and her sugar being >400. Timmesha stated she was just seen by her diabetic doctor this week and had her insulin adjusted. Lani reports being out of several medications. Medications were reviewed and verified. Pill box was filled accordingly. Patient noted to be out of Atorvastatin, Carvedilol, Colchicine. Patient reports having joint pain over the last several days also but states she has been taking Tylenol PRN. Patient was advised it was my medical recommendation that we seek further evaluation in the ED due to low BP and high CBG. Patient refused reporting she will hydrate and take medications as prescribed and if no improvements tomorrow she will go to ED. I advised patient to reach out to me tonight with updated vitals and in the AM as well. I will continue to follow. I plan for visit with Cynda next Thursday. Home visit complete.    Meds for refill:  Atorvastatin  Carvedilol Colchicine Spironolactone   CBG- 451 BP- 84/62 HR- 80 TEMP- 98.7 O2- 94%      Future Appointments  Date Time Provider [place]  09/01/2019  7:20 AM [facility] DEVICE REMOTES [facility]  12/01/2019  7:20 AM [facility] DEVICE REMOTES [facility]  03/01/2020  7:20 AM [facility] DEVICE REMOTES [facility]     ACTION: Home visit completed Next visit planned for next thursday

## 2019-07-14 ENCOUNTER — Ambulatory Visit: Payer: Medicare HMO | Admitting: Internal Medicine

## 2019-07-14 ENCOUNTER — Telehealth: Payer: Self-pay

## 2019-07-14 NOTE — Telephone Encounter (Signed)
Pt sent unscheduled PM transmission- Ongoing AF episode, VR 100-170's.  Pt has history of known AF on Carvedilol 25mg  BID and Eliquis 5mg  BID.  Pt overdue for EP MD visit.    Spoke with pt, she sent transmission this AM due to feeling "heavy heart beats" unsure of how long it lasted as she fell back asleep.  Pt reports that for the past month she has been having issues with dizziness/balance that she attributes to a problem with her ears and has been seeing her PCP for.  Pt confirms she has not missed any doses of medications, although has not yet taken meds today, she will do that now.  While on the phone, pt sent another manual transmission.  Pt back in normal rhythm.  AF episode appears to have last 18 minutes.    Advised pt would forward to Dr. Curt Bears for review.  Also noted o/d for office visit, will have scheduling contact her for appt next avail.  Educated on ED precautions.

## 2019-07-15 ENCOUNTER — Other Ambulatory Visit (HOSPITAL_COMMUNITY): Payer: Self-pay

## 2019-07-15 NOTE — Progress Notes (Signed)
Paramedicine Encounter    Patient ID: Danielle [name], female    DOB: November 20, 1945, 74 y.o.   MRN: [id number]   Patient Care Team: Seward Carol, MD as PCP - General (Internal Medicine) Jorge Ny, LCSW as Social Worker (Licensed Clinical Social Worker)  Patient Active Problem List   Diagnosis Date Noted  . Type 2 diabetes mellitus with hyperglycemia, with long-term current use of insulin ([place]) 05/14/2019  . Diabetes mellitus ([place]) 05/14/2019  . 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, with long-term current use of insulin ([place]) 05/14/2019  . Type 2 diabetes mellitus with diabetic polyneuropathy, with long-term current use of insulin ([place]) 05/14/2019  . Encephalopathy acute 05/20/2018  . 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 Acute kidney failure ([place]) 05/15/2018  . Primary localized osteoarthritis of right knee 05/13/2018  . (HFpEF) heart failure with preserved ejection fraction ([place]) 12/20/2017  . Obesity (BMI 30-39.9) 11/11/2017  . OSA (obstructive sleep apnea) 02/18/2017  . Essential hypertension   . Diabetes mellitus without complication ([place])   . Cancer ([place])   . Thyroid disease     Current Outpatient Medications:  .  acetaminophen (TYLENOL) 500 MG tablet, Take 500-1,000 mg by mouth every 6 (six) hours as needed (for pain or headaches)., Disp: , Rfl:  .  allopurinol (ZYLOPRIM) 100 MG tablet, Take 1 tablet (100 mg total) by mouth daily., Disp: , Rfl:  .  apixaban (ELIQUIS) 5 MG TABS tablet, Take 1 tablet (5 mg total) by mouth 2 (two) times daily., Disp: 180 tablet, Rfl: 3 .  atorvastatin (LIPITOR) 80 MG tablet, Take 1 tablet (80 mg total) by mouth at bedtime., Disp: 30 tablet, Rfl: 5 .  Blood Glucose Monitoring Suppl (ACCU-CHEK AVIVA) device, by Other route. Use as instructed, Disp: , Rfl:  .  calcium citrate (CALCITRATE - DOSED IN MG ELEMENTAL CALCIUM) 950 MG tablet, Take 1 tablet by mouth 2 (two) times daily. , Disp: , Rfl:  .   carvedilol (COREG) 25 MG tablet, TAKE 1 TABLET BY MOUTH TWICE DAILY WITH MEALS, Disp: 60 tablet, Rfl: 2 .  clotrimazole-betamethasone (LOTRISONE) lotion, APPLY LOTION TOPICALLY TWICE DAILY FOR 7 DAYS, Disp: , Rfl:  .  colchicine 0.6 MG tablet, TAKE 1 TABLET BY MOUTH EVERY OTHER DAY TO PREVENT FLARES, Disp: , Rfl:  .  ferrous sulfate 325 (65 FE) MG tablet, Take 325 mg by mouth 2 (two) times daily with a meal., Disp: , Rfl:  .  gabapentin (NEURONTIN) 300 MG capsule, Take 300 mg by mouth daily. , Disp: , Rfl:  .  glucose blood test strip, Use as instructed to test blood sugars 3 times daily E11.65  Freestyle lite, Disp: 400 each, Rfl: 3 .  insulin glargine (LANTUS) 100 UNIT/ML injection, Inject 88 Units into the skin daily., Disp: , Rfl:  .  Insulin Lispro (HUMALOG KWIKPEN) 200 UNIT/ML SOPN, Inject 24 Units into the skin 3 (three) times daily., Disp: 27 mL, Rfl: 3 .  Insulin Pen Needle 32G X 4 MM MISC, 1 Device by Does not apply route 4 (four) times daily., Disp: 400 each, Rfl: 3 .  isosorbide mononitrate (IMDUR) 30 MG 24 hr tablet, Take 1 tablet by mouth once daily, Disp: 30 tablet, Rfl: 0 .  JARDIANCE 10 MG TABS tablet, Take 1 tablet by mouth once daily, Disp: 90 tablet, Rfl: 0 .  levothyroxine (SYNTHROID, LEVOTHROID) 75 MCG tablet, Take 75 mcg by mouth daily before breakfast., Disp: , Rfl:  .  losartan (  COZAAR) 50 MG tablet, Take 1 tablet (50 mg total) by mouth at bedtime. Please call for OV [phone], Disp: 30 tablet, Rfl: 0 .  Multiple Vitamins-Minerals (ALIVE ONCE DAILY WOMENS 50+ PO), Take 1 tablet by mouth daily., Disp: , Rfl:  .  potassium chloride (K-DUR) 10 MEQ tablet, Take 2 tablets by mouth once daily, Disp: 60 tablet, Rfl: 0 .  spironolactone (ALDACTONE) 25 MG tablet, Take 1 tablet (25 mg total) by mouth every other day. Needs appt for further refills, Disp: 30 tablet, Rfl: 2 .  torsemide (DEMADEX) 20 MG tablet, Take 2 tablets (40 mg total) by mouth daily., Disp: 60 tablet, Rfl: 3 .   Travoprost, BAK Free, (TRAVATAN) 0.004 % SOLN ophthalmic solution, Place 1 drop into both eyes at bedtime. , Disp: , Rfl:  .  Biotin 10 MG CAPS, Take 10 mg by mouth daily., Disp: , Rfl:  .  CHONDROITIN SULFATE PO, Take 250 mg by mouth daily., Disp: , Rfl:  .  nitroGLYCERIN (NITROSTAT) 0.4 MG SL tablet, DISSOLVE ONE TABLET UNDER THE TONGUE EVERY 5 MINUTES AS NEEDED FOR CHEST PAIN. (Patient not taking: Reported on 06/04/2019), Disp: 25 tablet, Rfl: 3 .  omeprazole (PRILOSEC) 20 MG capsule, Take 20 mg by mouth daily. , Disp: , Rfl:  Allergies  Allergen Reactions  . Other Other (See Comments)    Blood products- Patient won't accept any  . Sulfa Antibiotics Rash  . Uloric [Febuxostat] Rash     Social History   Socioeconomic History  . Marital status: Widowed    Spouse name: Not on file  . Number of children: 3  . Years of education: Not on file  . Highest education level: Some college, no degree  Occupational History  . Occupation: retired  Tobacco Use  . Smoking status: Former Smoker    Packs/day: 1.00    Years: 22.00    Pack years: 22.00    Types: Cigarettes    Quit date: 1983    Years since quitting: 38.1  . Smokeless tobacco: Never Used  Substance and Sexual Activity  . Alcohol use: Yes    Comment: 05/22/2018 "glass of wine couple times/wk"  . Drug use: Not Currently  . Sexual activity: Not Currently    Birth control/protection: None, Post-menopausal  Other Topics Concern  . Not on file  Social History Narrative  . Not on file   Social Determinants of Health   Financial Resource Strain:   . Difficulty of Paying Living Expenses: Not on file  Food Insecurity:   . Worried About Charity fundraiser in the Last Year: Not on file  . Ran Out of Food in the Last Year: Not on file  Transportation Needs:   . Lack of Transportation (Medical): Not on file  . Lack of Transportation (Non-Medical): Not on file  Physical Activity:   . Days of Exercise per Week: Not on file  .  Minutes of Exercise per Session: Not on file  Stress:   . Feeling of Stress : Not on file  Social Connections:   . Frequency of Communication with Friends and Family: Not on file  . Frequency of Social Gatherings with Friends and Family: Not on file  . Attends Religious Services: Not on file  . Active Member of Clubs or Organizations: Not on file  . Attends Archivist Meetings: Not on file  . Marital Status: Not on file  Intimate Partner Violence:   . Fear of Current or Ex-Partner: Not on  file  . Emotionally Abused: Not on file  . Physically Abused: Not on file  . Sexually Abused: Not on file    Physical Exam Vitals reviewed.  HENT:     Head: Normocephalic.     Nose: Nose normal.     Mouth/Throat:     Mouth: Mucous membranes are moist.  Eyes:     Pupils: Pupils are equal, round, and reactive to light.  Cardiovascular:     Rate and Rhythm: Normal rate and regular rhythm.     Pulses: Normal pulses.     Heart sounds: Normal heart sounds.  Pulmonary:     Effort: Pulmonary effort is normal.     Breath sounds: Normal breath sounds.  Abdominal:     Palpations: Abdomen is soft.  Musculoskeletal:        General: Normal range of motion.     Cervical back: Normal range of motion.     Right lower leg: Edema present.     Left lower leg: Edema present.  Skin:    General: Skin is warm and dry.     Capillary Refill: Capillary refill takes less than 2 seconds.  Neurological:     Mental Status: She is alert. Mental status is at baseline.  Psychiatric:        Mood and Affect: Mood normal.     Arrived for home visit for [facility]. I picked up medications at pharmacy for patient prior to visit. Medications reviewed and confirmed. Pill box filled accordingly. Vitals obtained. Patient's blood pressure still noted to be lower which is not patient's known normal. Patient reports having an episode of AFIB two days ago recorded and reported to doctors office. No further instructions  from office other than continuing normal medications. Patient reports still feeling fatigued, dizzy and "off balance" for several weeks now. PCP is aware of same. Edema noted to both legs. Patient reports trying to keep them up and elevated.She denied shortness of breath or trouble breathing when sleeping. I encouraged patient to make sure she is taking all medications appropriately and to call me if needed. I will follow up in one week.   Refills: Levothyroxine Omeprazole   CBG- 232  Future Appointments  Date Time Provider [place]  09/01/2019  7:20 AM [facility] DEVICE REMOTES [facility]  09/01/2019  1:45 PM Curt Bears, Ocie Doyne, MD [facility]  12/01/2019  7:20 AM [facility] DEVICE REMOTES [facility]  03/01/2020  7:20 AM [facility] DEVICE REMOTES [facility]     ACTION: Home visit completed Next visit planned for one week

## 2019-07-22 ENCOUNTER — Other Ambulatory Visit (HOSPITAL_COMMUNITY): Payer: Self-pay

## 2019-07-22 ENCOUNTER — Telehealth (HOSPITAL_COMMUNITY): Payer: Self-pay | Admitting: Licensed Clinical Social Worker

## 2019-07-22 ENCOUNTER — Telehealth (HOSPITAL_COMMUNITY): Payer: Self-pay

## 2019-07-22 NOTE — Telephone Encounter (Signed)
Heather,paramedicine called to report that the patient had a increase in weight since last week. Last week her weight was 207 today her weight was 220. Patient denies sob,chest pain,etc but states she just feels fatigue.   After reviewing this with Ann Maki she stated that the patient should increase her Torsemide to 40mg  BID x2days and increase her K to 27meq qd x2days. We will call and check in with patient on Monday 3/1 to see if symptoms improved with the medication titration. If not we will have patient come in for an office visit.   Heather, paramedicine was advised and verbalized understanding.

## 2019-07-22 NOTE — Progress Notes (Signed)
Paramedicine Encounter    Patient ID: Danielle [name], female    DOB: 12/30/1945, 74 y.o.   MRN: [id number]   Patient Care Team: Seward Carol, MD as PCP - General (Internal Medicine) Jorge Ny, LCSW as Social Worker (Licensed Clinical Social Worker)  Patient Active Problem List   Diagnosis Date Noted  . Type 2 diabetes mellitus with hyperglycemia, with long-term current use of insulin ([place]) 05/14/2019  . Diabetes mellitus ([place]) 05/14/2019  . 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, with long-term current use of insulin ([place]) 05/14/2019  . Type 2 diabetes mellitus with diabetic polyneuropathy, with long-term current use of insulin ([place]) 05/14/2019  . Encephalopathy acute 05/20/2018  . 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 Acute kidney failure ([place]) 05/15/2018  . Primary localized osteoarthritis of right knee 05/13/2018  . (HFpEF) heart failure with preserved ejection fraction ([place]) 12/20/2017  . Obesity (BMI 30-39.9) 11/11/2017  . OSA (obstructive sleep apnea) 02/18/2017  . Essential hypertension   . Diabetes mellitus without complication ([place])   . Cancer ([place])   . Thyroid disease     Current Outpatient Medications:  .  acetaminophen (TYLENOL) 500 MG tablet, Take 500-1,000 mg by mouth every 6 (six) hours as needed (for pain or headaches)., Disp: , Rfl:  .  allopurinol (ZYLOPRIM) 100 MG tablet, Take 1 tablet (100 mg total) by mouth daily., Disp: , Rfl:  .  apixaban (ELIQUIS) 5 MG TABS tablet, Take 1 tablet (5 mg total) by mouth 2 (two) times daily., Disp: 180 tablet, Rfl: 3 .  atorvastatin (LIPITOR) 80 MG tablet, Take 1 tablet (80 mg total) by mouth at bedtime., Disp: 30 tablet, Rfl: 5 .  Biotin 10 MG CAPS, Take 10 mg by mouth daily., Disp: , Rfl:  .  Blood Glucose Monitoring Suppl (ACCU-CHEK AVIVA) device, by Other route. Use as instructed, Disp: , Rfl:  .  calcium citrate (CALCITRATE - DOSED IN MG ELEMENTAL CALCIUM) 950 MG tablet,  Take 1 tablet by mouth 2 (two) times daily. , Disp: , Rfl:  .  carvedilol (COREG) 25 MG tablet, TAKE 1 TABLET BY MOUTH TWICE DAILY WITH MEALS, Disp: 60 tablet, Rfl: 2 .  colchicine 0.6 MG tablet, TAKE 1 TABLET BY MOUTH EVERY OTHER DAY TO PREVENT FLARES, Disp: , Rfl:  .  ferrous sulfate 325 (65 FE) MG tablet, Take 325 mg by mouth 2 (two) times daily with a meal., Disp: , Rfl:  .  gabapentin (NEURONTIN) 300 MG capsule, Take 300 mg by mouth daily. , Disp: , Rfl:  .  glucose blood test strip, Use as instructed to test blood sugars 3 times daily E11.65  Freestyle lite, Disp: 400 each, Rfl: 3 .  insulin glargine (LANTUS) 100 UNIT/ML injection, Inject 88 Units into the skin daily., Disp: , Rfl:  .  Insulin Lispro (HUMALOG KWIKPEN) 200 UNIT/ML SOPN, Inject 24 Units into the skin 3 (three) times daily., Disp: 27 mL, Rfl: 3 .  Insulin Pen Needle 32G X 4 MM MISC, 1 Device by Does not apply route 4 (four) times daily., Disp: 400 each, Rfl: 3 .  isosorbide mononitrate (IMDUR) 30 MG 24 hr tablet, Take 1 tablet by mouth once daily, Disp: 30 tablet, Rfl: 0 .  JARDIANCE 10 MG TABS tablet, Take 1 tablet by mouth once daily, Disp: 90 tablet, Rfl: 0 .  levothyroxine (SYNTHROID, LEVOTHROID) 75 MCG tablet, Take 75 mcg by mouth daily before breakfast., Disp: , Rfl:  .  losartan (COZAAR)  50 MG tablet, Take 1 tablet (50 mg total) by mouth at bedtime. Please call for OV [phone], Disp: 30 tablet, Rfl: 0 .  Multiple Vitamins-Minerals (ALIVE ONCE DAILY WOMENS 50+ PO), Take 1 tablet by mouth daily., Disp: , Rfl:  .  omeprazole (PRILOSEC) 20 MG capsule, Take 20 mg by mouth daily. , Disp: , Rfl:  .  potassium chloride (K-DUR) 10 MEQ tablet, Take 2 tablets by mouth once daily, Disp: 60 tablet, Rfl: 0 .  spironolactone (ALDACTONE) 25 MG tablet, Take 1 tablet (25 mg total) by mouth every other day. Needs appt for further refills, Disp: 30 tablet, Rfl: 2 .  torsemide (DEMADEX) 20 MG tablet, Take 2 tablets (40 mg total) by mouth  daily., Disp: 60 tablet, Rfl: 3 .  Travoprost, BAK Free, (TRAVATAN) 0.004 % SOLN ophthalmic solution, Place 1 drop into both eyes at bedtime. , Disp: , Rfl:  .  CHONDROITIN SULFATE PO, Take 250 mg by mouth daily., Disp: , Rfl:  .  clotrimazole-betamethasone (LOTRISONE) lotion, APPLY LOTION TOPICALLY TWICE DAILY FOR 7 DAYS, Disp: , Rfl:  .  nitroGLYCERIN (NITROSTAT) 0.4 MG SL tablet, DISSOLVE ONE TABLET UNDER THE TONGUE EVERY 5 MINUTES AS NEEDED FOR CHEST PAIN. (Patient not taking: Reported on 06/04/2019), Disp: 25 tablet, Rfl: 3 Allergies  Allergen Reactions  . Other Other (See Comments)    Blood products- Patient won't accept any  . Sulfa Antibiotics Rash  . Uloric [Febuxostat] Rash     Social History   Socioeconomic History  . Marital status: Widowed    Spouse name: Not on file  . Number of children: 3  . Years of education: Not on file  . Highest education level: Some college, no degree  Occupational History  . Occupation: retired  Tobacco Use  . Smoking status: Former Smoker    Packs/day: 1.00    Years: 22.00    Pack years: 22.00    Types: Cigarettes    Quit date: 1983    Years since quitting: 38.1  . Smokeless tobacco: Never Used  Substance and Sexual Activity  . Alcohol use: Yes    Comment: 05/22/2018 "glass of wine couple times/wk"  . Drug use: Not Currently  . Sexual activity: Not Currently    Birth control/protection: None, Post-menopausal  Other Topics Concern  . Not on file  Social History Narrative  . Not on file   Social Determinants of Health   Financial Resource Strain:   . Difficulty of Paying Living Expenses: Not on file  Food Insecurity:   . Worried About Charity fundraiser in the Last Year: Not on file  . Ran Out of Food in the Last Year: Not on file  Transportation Needs:   . Lack of Transportation (Medical): Not on file  . Lack of Transportation (Non-Medical): Not on file  Physical Activity:   . Days of Exercise per Week: Not on file  .  Minutes of Exercise per Session: Not on file  Stress:   . Feeling of Stress : Not on file  Social Connections:   . Frequency of Communication with Friends and Family: Not on file  . Frequency of Social Gatherings with Friends and Family: Not on file  . Attends Religious Services: Not on file  . Active Member of Clubs or Organizations: Not on file  . Attends Archivist Meetings: Not on file  . Marital Status: Not on file  Intimate Partner Violence:   . Fear of Current or Ex-Partner: Not on  file  . Emotionally Abused: Not on file  . Physically Abused: Not on file  . Sexually Abused: Not on file    Physical Exam Vitals reviewed.  HENT:     Head: Normocephalic.     Nose: Nose normal.     Mouth/Throat:     Mouth: Mucous membranes are moist.  Eyes:     Pupils: Pupils are equal, round, and reactive to light.  Cardiovascular:     Rate and Rhythm: Normal rate and regular rhythm.     Pulses: Normal pulses.     Heart sounds: Normal heart sounds.  Pulmonary:     Effort: Pulmonary effort is normal.     Breath sounds: Normal breath sounds.  Abdominal:     Palpations: Abdomen is soft.  Musculoskeletal:        General: Swelling present. Normal range of motion.     Cervical back: Normal range of motion.     Right lower leg: Edema present.     Left lower leg: Edema present.     Comments: Left groin pain, worse when walking and movement of left hip.   Skin:    General: Skin is warm.     Capillary Refill: Capillary refill takes less than 2 seconds.  Neurological:     Mental Status: She is alert. Mental status is at baseline.  Psychiatric:        Mood and Affect: Mood normal.    Arrived for home visit for Angelina who was alert and oriented seated at her home alert and oriented. Tattiana reports having increased swelling, fatigue and lack of appetite. +3 edema noted in both legs, left arm and hand swelling. Vitals obtained. Medications reviewed and confirmed. Pill box filled  appropriately. I called provider line with findings. Provider suggested Torsemide 40 twice daily for two days, Potassium 40 mg daily for two days. Pill box changed.  Will check in on Monday. Appointment next week.      Refills:  -Torsemide -Isosorbide -Potassium    Weight today- 220lbs Weight last- 207lbs   Urine: light tan. No burning or pain upon urinating.    Future Appointments  Date Time Provider [place]  09/01/2019  7:20 AM [facility] DEVICE REMOTES [facility]  09/01/2019  1:45 PM Constance Haw, MD [facility]  12/01/2019  7:20 AM [facility] DEVICE REMOTES [facility]  03/01/2020  7:20 AM [facility] DEVICE REMOTES [facility]     ACTION: Home visit completed Next visit planned for one week

## 2019-07-22 NOTE — Telephone Encounter (Signed)
CSW called pt to discuss Extra Help program to assist with insurance premium costs and medication costs and assess if pt is eligible. Pt reports she is over income requirement and has actually applied before and been denied.  No other needs reported at this time.  CSW will continue to follow through [employer] and assist as needed,  Jorge Ny, LCSW Clinical Social Worker [facility] Desk#: [phone] Cell#: [phone]

## 2019-07-23 ENCOUNTER — Other Ambulatory Visit (HOSPITAL_COMMUNITY): Payer: Self-pay | Admitting: Cardiology

## 2019-07-23 ENCOUNTER — Other Ambulatory Visit (HOSPITAL_COMMUNITY): Payer: Self-pay | Admitting: Internal Medicine

## 2019-07-29 ENCOUNTER — Other Ambulatory Visit (HOSPITAL_COMMUNITY): Payer: Self-pay | Admitting: Internal Medicine

## 2019-07-29 ENCOUNTER — Other Ambulatory Visit (HOSPITAL_COMMUNITY): Payer: Self-pay

## 2019-07-29 NOTE — Progress Notes (Signed)
Paramedicine Encounter    Patient ID: Danielle [name], female    DOB: 1946/02/06, 74 y.o.   MRN: [id number]   Patient Care Team: Seward Carol, MD as PCP - General (Internal Medicine) Jorge Ny, LCSW as Social Worker (Licensed Clinical Social Worker)  Patient Active Problem List   Diagnosis Date Noted  . Type 2 diabetes mellitus with hyperglycemia, with long-term current use of insulin ([place]) 05/14/2019  . Diabetes mellitus ([place]) 05/14/2019  . 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, with long-term current use of insulin ([place]) 05/14/2019  . Type 2 diabetes mellitus with diabetic polyneuropathy, with long-term current use of insulin ([place]) 05/14/2019  . Encephalopathy acute 05/20/2018  . 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 Acute kidney failure ([place]) 05/15/2018  . Primary localized osteoarthritis of right knee 05/13/2018  . (HFpEF) heart failure with preserved ejection fraction ([place]) 12/20/2017  . Obesity (BMI 30-39.9) 11/11/2017  . OSA (obstructive sleep apnea) 02/18/2017  . Essential hypertension   . Diabetes mellitus without complication ([place])   . Cancer ([place])   . Thyroid disease     Current Outpatient Medications:  .  acetaminophen (TYLENOL) 500 MG tablet, Take 500-1,000 mg by mouth every 6 (six) hours as needed (for pain or headaches)., Disp: , Rfl:  .  allopurinol (ZYLOPRIM) 100 MG tablet, Take 1 tablet (100 mg total) by mouth daily., Disp: , Rfl:  .  apixaban (ELIQUIS) 5 MG TABS tablet, Take 1 tablet (5 mg total) by mouth 2 (two) times daily., Disp: 180 tablet, Rfl: 3 .  atorvastatin (LIPITOR) 80 MG tablet, Take 1 tablet (80 mg total) by mouth at bedtime., Disp: 30 tablet, Rfl: 5 .  Blood Glucose Monitoring Suppl (ACCU-CHEK AVIVA) device, by Other route. Use as instructed, Disp: , Rfl:  .  calcium citrate (CALCITRATE - DOSED IN MG ELEMENTAL CALCIUM) 950 MG tablet, Take 1 tablet by mouth 2 (two) times daily. , Disp: , Rfl:  .   carvedilol (COREG) 25 MG tablet, TAKE 1 TABLET BY MOUTH TWICE DAILY WITH MEALS, Disp: 60 tablet, Rfl: 2 .  clotrimazole-betamethasone (LOTRISONE) lotion, APPLY LOTION TOPICALLY TWICE DAILY FOR 7 DAYS, Disp: , Rfl:  .  colchicine 0.6 MG tablet, TAKE 1 TABLET BY MOUTH EVERY OTHER DAY TO PREVENT FLARES, Disp: , Rfl:  .  ferrous sulfate 325 (65 FE) MG tablet, Take 325 mg by mouth 2 (two) times daily with a meal., Disp: , Rfl:  .  gabapentin (NEURONTIN) 300 MG capsule, Take 300 mg by mouth daily. , Disp: , Rfl:  .  glucose blood test strip, Use as instructed to test blood sugars 3 times daily E11.65  Freestyle lite, Disp: 400 each, Rfl: 3 .  insulin glargine (LANTUS) 100 UNIT/ML injection, Inject 88 Units into the skin daily., Disp: , Rfl:  .  Insulin Pen Needle 32G X 4 MM MISC, 1 Device by Does not apply route 4 (four) times daily., Disp: 400 each, Rfl: 3 .  isosorbide mononitrate (IMDUR) 30 MG 24 hr tablet, TAKE 1 TABLET BY MOUTH ONCE DAILY(CALL FOR OFFICE VISIT [phone]), Disp: 30 tablet, Rfl: 0 .  JARDIANCE 10 MG TABS tablet, Take 1 tablet by mouth once daily, Disp: 90 tablet, Rfl: 0 .  levothyroxine (SYNTHROID, LEVOTHROID) 75 MCG tablet, Take 75 mcg by mouth daily before breakfast., Disp: , Rfl:  .  losartan (COZAAR) 50 MG tablet, Take 1 tablet (50 mg total) by mouth at bedtime. Please call for OV [phone], Disp:  30 tablet, Rfl: 0 .  Multiple Vitamins-Minerals (ALIVE ONCE DAILY WOMENS 50+ PO), Take 1 tablet by mouth daily., Disp: , Rfl:  .  omeprazole (PRILOSEC) 20 MG capsule, Take 20 mg by mouth daily. , Disp: , Rfl:  .  potassium chloride (K-DUR) 10 MEQ tablet, Take 2 tablets by mouth once daily, Disp: 60 tablet, Rfl: 0 .  spironolactone (ALDACTONE) 25 MG tablet, Take 1 tablet (25 mg total) by mouth every other day. Needs appt for further refills, Disp: 30 tablet, Rfl: 2 .  torsemide (DEMADEX) 20 MG tablet, Take 2 tablets by mouth once daily, Disp: 60 tablet, Rfl: 0 .  Travoprost, BAK  Free, (TRAVATAN) 0.004 % SOLN ophthalmic solution, Place 1 drop into both eyes at bedtime. , Disp: , Rfl:  .  Biotin 10 MG CAPS, Take 10 mg by mouth daily., Disp: , Rfl:  .  CHONDROITIN SULFATE PO, Take 250 mg by mouth daily., Disp: , Rfl:  .  Insulin Lispro (HUMALOG KWIKPEN) 200 UNIT/ML SOPN, Inject 24 Units into the skin 3 (three) times daily., Disp: 27 mL, Rfl: 3 .  nitroGLYCERIN (NITROSTAT) 0.4 MG SL tablet, DISSOLVE ONE TABLET UNDER THE TONGUE EVERY 5 MINUTES AS NEEDED FOR CHEST PAIN. (Patient not taking: Reported on 06/04/2019), Disp: 25 tablet, Rfl: 3 Allergies  Allergen Reactions  . Other Other (See Comments)    Blood products- Patient won't accept any  . Sulfa Antibiotics Rash  . Uloric [Febuxostat] Rash     Social History   Socioeconomic History  . Marital status: Widowed    Spouse name: Not on file  . Number of children: 3  . Years of education: Not on file  . Highest education level: Some college, no degree  Occupational History  . Occupation: retired  Tobacco Use  . Smoking status: Former Smoker    Packs/day: 1.00    Years: 22.00    Pack years: 22.00    Types: Cigarettes    Quit date: 1983    Years since quitting: 38.1  . Smokeless tobacco: Never Used  Substance and Sexual Activity  . Alcohol use: Yes    Comment: 05/22/2018 "glass of wine couple times/wk"  . Drug use: Not Currently  . Sexual activity: Not Currently    Birth control/protection: None, Post-menopausal  Other Topics Concern  . Not on file  Social History Narrative  . Not on file   Social Determinants of Health   Financial Resource Strain:   . Difficulty of Paying Living Expenses: Not on file  Food Insecurity:   . Worried About Charity fundraiser in the Last Year: Not on file  . Ran Out of Food in the Last Year: Not on file  Transportation Needs:   . Lack of Transportation (Medical): Not on file  . Lack of Transportation (Non-Medical): Not on file  Physical Activity:   . Days of Exercise  per Week: Not on file  . Minutes of Exercise per Session: Not on file  Stress:   . Feeling of Stress : Not on file  Social Connections:   . Frequency of Communication with Friends and Family: Not on file  . Frequency of Social Gatherings with Friends and Family: Not on file  . Attends Religious Services: Not on file  . Active Member of Clubs or Organizations: Not on file  . Attends Archivist Meetings: Not on file  . Marital Status: Not on file  Intimate Partner Violence:   . Fear of Current or Ex-Partner:  Not on file  . Emotionally Abused: Not on file  . Physically Abused: Not on file  . Sexually Abused: Not on file    Physical Exam Vitals reviewed.  Constitutional:      Appearance: She is normal weight.  HENT:     Head: Normocephalic.     Nose: Nose normal.     Mouth/Throat:     Mouth: Mucous membranes are moist.  Eyes:     Pupils: Pupils are equal, round, and reactive to light.  Cardiovascular:     Rate and Rhythm: Normal rate and regular rhythm.     Pulses: Normal pulses.  Pulmonary:     Effort: Pulmonary effort is normal.     Breath sounds: Normal breath sounds.  Abdominal:     Palpations: Abdomen is soft.  Musculoskeletal:        General: Normal range of motion.     Cervical back: Normal range of motion.     Right lower leg: Edema present.     Left lower leg: Edema present.  Skin:    General: Skin is warm.     Capillary Refill: Capillary refill takes less than 2 seconds.  Neurological:     Mental Status: She is alert. Mental status is at baseline.  Psychiatric:        Mood and Affect: Mood normal.    Arrived for home visit for [place] who was awake and alert ambulating around her home without difficulty. [place] denied dizziness, shortness of breath, chest pain or trouble walking. Patient did state she felt she did not have much of an appetite and her sleeping patterns were off. Patient explained she has been suffering from some loss of a close  friend and feels this has affected her sleeping and eating habits. Patient was encouraged to be sure to eat 3 healthy meals a day including water. Patient verbalized understanding. Patient's vitals were obtained and patient's blood pressure noted to be low. Patient has had this happen once before and this was related to dehydration and lack of appetite. Patient was placed on heart monitor and EKG was normal. Patient had no symptoms associated with low BP. I contacted HF clinic and left a VM due to no answer. Patient did not want to go to the hospital for low blood pressure, I expressed risks and signs and symptoms she verbalized understanding. I spoke to Z. Karlton Lemon who agreed to come out to re-assess blood pressure on Friday. Patient understands that she needs to contact 911 if she becomes symptomatic. Medications were reviewed and confirmed. Pill box filled accordingly. Patient will need a few medications before next week.   Torsemide Monday, Tuesday, Weds  Isosorbide Tuesday, Weds   Refills: Eliquis Torsemide Losartan Isosorbide   Weight- 215lbs  CBG- 233  BP sitting- 88/50 BP standing- 124/60    Will follow up next week with patient.      Future Appointments  Date Time Provider [place]  09/01/2019  7:20 AM [facility]ES [facility]  09/01/2019  1:45 PM Constance Haw, MD [facility]  12/01/2019  7:20 AM [facility] DEVICE REMOTES [facility]  03/01/2020  7:20 AM [facility]ICE REMOTES [facility]     ACTION: Home visit completed Next visit planned for one week

## 2019-08-05 ENCOUNTER — Other Ambulatory Visit (HOSPITAL_COMMUNITY): Payer: Self-pay

## 2019-08-05 NOTE — Progress Notes (Signed)
Home visit for Danielle [name] today who stated she went to see her PCP today because she found a cyst on her buttocks and it was giving her a lot of pain. PCP evaluated same and gave her Steroid and Antibiotic. Patient stated she will start Antibiotic tonight and has already started the steroid. Medications were reviewed and confirmed. Pill box was filled accordingly. Patient denied chest pain, shortness of breath or dizziness. Patient was instructed on how to take antibiotic and she verbalized understanding. Danielle [name] agreed for visit in one week.    Refills  -Gabapentin -Atorvastatin -Ferrous Sulfate

## 2019-08-11 ENCOUNTER — Telehealth (HOSPITAL_COMMUNITY): Payer: Self-pay | Admitting: Licensed Clinical Social Worker

## 2019-08-11 NOTE — Telephone Encounter (Signed)
CSW called pt to see if they have received or been scheduled to receive the COVID-19 vaccine at this time.  Pt is not currently interested in getting the vaccine- CSW encouraged pt to reach out if she changes her mind.  Jorge Ny, LCSW Clinical Social Worker [facility] Desk#: [phone] Cell#: [phone]

## 2019-08-13 ENCOUNTER — Other Ambulatory Visit (HOSPITAL_COMMUNITY): Payer: Self-pay

## 2019-08-13 NOTE — Progress Notes (Signed)
Visited Danielle [name] today for a medication recognition and pill box fill. Medications were reviewed and verified. 1 pill box filled. Danielle [name] stated she had a doctors visit today and had a good report. Danielle [name] states she is doing okay with no current complaints aside from her normal arthritis. Vitals as noted.  [place] agreed to visit in one week.   Meds called in: Colchicine  Atorvastatin Ferrous Sulfate  Gabapentin

## 2019-08-20 ENCOUNTER — Other Ambulatory Visit (HOSPITAL_COMMUNITY): Payer: Self-pay

## 2019-08-20 ENCOUNTER — Other Ambulatory Visit (HOSPITAL_COMMUNITY): Payer: Self-pay | Admitting: Cardiology

## 2019-08-20 NOTE — Progress Notes (Signed)
Paramedicine Encounter    Patient ID: Danielle [name], female    DOB: 07-21-1945, 74 y.o.   MRN: [id number]   Patient Care Team: Seward Carol, MD as PCP - General (Internal Medicine) Jorge Ny, LCSW as Social Worker (Licensed Clinical Social Worker)  Patient Active Problem List   Diagnosis Date Noted  . Type 2 diabetes mellitus with hyperglycemia, with long-term current use of insulin ([place]) 05/14/2019  . Diabetes mellitus ([place]) 05/14/2019  . 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, with long-term current use of insulin ([place]) 05/14/2019  . Type 2 diabetes mellitus with diabetic polyneuropathy, with long-term current use of insulin ([place]) 05/14/2019  . Encephalopathy acute 05/20/2018  . 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 Acute kidney failure ([place]) 05/15/2018  . Primary localized osteoarthritis of right knee 05/13/2018  . (HFpEF) heart failure with preserved ejection fraction ([place]) 12/20/2017  . Obesity (BMI 30-39.9) 11/11/2017  . OSA (obstructive sleep apnea) 02/18/2017  . Essential hypertension   . Diabetes mellitus without complication ([place])   . Cancer ([place])   . Thyroid disease     Current Outpatient Medications:  .  acetaminophen (TYLENOL) 500 MG tablet, Take 500-1,000 mg by mouth every 6 (six) hours as needed (for pain or headaches)., Disp: , Rfl:  .  allopurinol (ZYLOPRIM) 100 MG tablet, Take 1 tablet (100 mg total) by mouth daily., Disp: , Rfl:  .  apixaban (ELIQUIS) 5 MG TABS tablet, Take 1 tablet (5 mg total) by mouth 2 (two) times daily., Disp: 180 tablet, Rfl: 3 .  atorvastatin (LIPITOR) 80 MG tablet, Take 1 tablet (80 mg total) by mouth at bedtime., Disp: 30 tablet, Rfl: 5 .  Biotin 10 MG CAPS, Take 10 mg by mouth daily., Disp: , Rfl:  .  Blood Glucose Monitoring Suppl (ACCU-CHEK AVIVA) device, by Other route. Use as instructed, Disp: , Rfl:  .  calcium citrate (CALCITRATE - DOSED IN MG ELEMENTAL CALCIUM) 950 MG tablet,  Take 1 tablet by mouth 2 (two) times daily. , Disp: , Rfl:  .  carvedilol (COREG) 25 MG tablet, TAKE 1 TABLET BY MOUTH TWICE DAILY WITH MEALS, Disp: 60 tablet, Rfl: 2 .  clotrimazole-betamethasone (LOTRISONE) lotion, APPLY LOTION TOPICALLY TWICE DAILY FOR 7 DAYS, Disp: , Rfl:  .  colchicine 0.6 MG tablet, TAKE 1 TABLET BY MOUTH EVERY OTHER DAY TO PREVENT FLARES, Disp: , Rfl:  .  ferrous sulfate 325 (65 FE) MG tablet, Take 325 mg by mouth 2 (two) times daily with a meal., Disp: , Rfl:  .  gabapentin (NEURONTIN) 300 MG capsule, Take 300 mg by mouth daily. , Disp: , Rfl:  .  glucose blood test strip, Use as instructed to test blood sugars 3 times daily E11.65  Freestyle lite, Disp: 400 each, Rfl: 3 .  insulin glargine (LANTUS) 100 UNIT/ML injection, Inject 88 Units into the skin daily., Disp: , Rfl:  .  Insulin Lispro (HUMALOG KWIKPEN) 200 UNIT/ML SOPN, Inject 24 Units into the skin 3 (three) times daily., Disp: 27 mL, Rfl: 3 .  Insulin Pen Needle 32G X 4 MM MISC, 1 Device by Does not apply route 4 (four) times daily., Disp: 400 each, Rfl: 3 .  isosorbide mononitrate (IMDUR) 30 MG 24 hr tablet, TAKE 1 TABLET BY MOUTH ONCE DAILY(CALL FOR OFFICE VISIT [phone]), Disp: 30 tablet, Rfl: 0 .  JARDIANCE 10 MG TABS tablet, Take 1 tablet by mouth once daily, Disp: 90 tablet, Rfl: 0 .  levothyroxine (  SYNTHROID, LEVOTHROID) 75 MCG tablet, Take 75 mcg by mouth daily before breakfast., Disp: , Rfl:  .  losartan (COZAAR) 50 MG tablet, TAKE 1 TABLET BY MOUTH AT BEDTIME(CALL OFFICE FOR VISIT [phone]), Disp: 30 tablet, Rfl: 0 .  Multiple Vitamins-Minerals (ALIVE ONCE DAILY WOMENS 50+ PO), Take 1 tablet by mouth daily., Disp: , Rfl:  .  omeprazole (PRILOSEC) 20 MG capsule, Take 20 mg by mouth daily. , Disp: , Rfl:  .  potassium chloride (K-DUR) 10 MEQ tablet, Take 2 tablets by mouth once daily, Disp: 60 tablet, Rfl: 0 .  spironolactone (ALDACTONE) 25 MG tablet, Take 1 tablet (25 mg total) by mouth every other  day. Needs appt for further refills, Disp: 30 tablet, Rfl: 2 .  torsemide (DEMADEX) 20 MG tablet, Take 2 tablets by mouth once daily, Disp: 60 tablet, Rfl: 0 .  Travoprost, BAK Free, (TRAVATAN) 0.004 % SOLN ophthalmic solution, Place 1 drop into both eyes at bedtime. , Disp: , Rfl:  .  CHONDROITIN SULFATE PO, Take 250 mg by mouth daily., Disp: , Rfl:  .  nitroGLYCERIN (NITROSTAT) 0.4 MG SL tablet, DISSOLVE ONE TABLET UNDER THE TONGUE EVERY 5 MINUTES AS NEEDED FOR CHEST PAIN. (Patient not taking: Reported on 06/04/2019), Disp: 25 tablet, Rfl: 3 Allergies  Allergen Reactions  . Other Other (See Comments)    Blood products- Patient won't accept any  . Sulfa Antibiotics Rash  . Uloric [Febuxostat] Rash     Social History   Socioeconomic History  . Marital status: Widowed    Spouse name: Not on file  . Number of children: 3  . Years of education: Not on file  . Highest education level: Some college, no degree  Occupational History  . Occupation: retired  Tobacco Use  . Smoking status: Former Smoker    Packs/day: 1.00    Years: 22.00    Pack years: 22.00    Types: Cigarettes    Quit date: 1983    Years since quitting: 38.2  . Smokeless tobacco: Never Used  Substance and Sexual Activity  . Alcohol use: Yes    Comment: 05/22/2018 "glass of wine couple times/wk"  . Drug use: Not Currently  . Sexual activity: Not Currently    Birth control/protection: None, Post-menopausal  Other Topics Concern  . Not on file  Social History Narrative  . Not on file   Social Determinants of Health   Financial Resource Strain:   . Difficulty of Paying Living Expenses:   Food Insecurity:   . Worried About Charity fundraiser in the Last Year:   . Arboriculturist in the Last Year:   Transportation Needs:   . Film/video editor (Medical):   Marland Kitchen Lack of Transportation (Non-Medical):   Physical Activity:   . Days of Exercise per Week:   . Minutes of Exercise per Session:   Stress:   .  Feeling of Stress :   Social Connections:   . Frequency of Communication with Friends and Family:   . Frequency of Social Gatherings with Friends and Family:   . Attends Religious Services:   . Active Member of Clubs or Organizations:   . Attends Archivist Meetings:   Marland Kitchen Marital Status:   Intimate Partner Violence:   . Fear of Current or Ex-Partner:   . Emotionally Abused:   Marland Kitchen Physically Abused:   . Sexually Abused:     Physical Exam Vitals reviewed.  Constitutional:      Appearance: She  is normal weight.  HENT:     Head: Normocephalic.     Nose: Nose normal.     Mouth/Throat:     Mouth: Mucous membranes are moist.  Eyes:     Pupils: Pupils are equal, round, and reactive to light.  Cardiovascular:     Rate and Rhythm: Normal rate and regular rhythm.     Pulses: Normal pulses.     Heart sounds: Normal heart sounds.  Pulmonary:     Effort: Pulmonary effort is normal.     Breath sounds: Normal breath sounds.  Abdominal:     Palpations: Abdomen is soft.  Musculoskeletal:        General: Normal range of motion.     Cervical back: Normal range of motion.     Right lower leg: Edema present.     Left lower leg: Edema present.  Skin:    General: Skin is warm and dry.     Capillary Refill: Capillary refill takes less than 2 seconds.  Neurological:     Mental Status: She is alert. Mental status is at baseline.  Psychiatric:        Mood and Affect: Mood normal.    Arrived for home visit for Danielle [name] who was alert and oriented seated on her couch with no complaints stating she felt "good". Vitals obtained and sugar noted to be 434. Ji noted she had not yet had her insulin and had just ate [employer], Fried Chicken and [employer]. I educated Danielle [name] on healthy eating habits. Her daughter who was there also assisted in this. [place]ney expressed understanding. Medications were reviewed and confirmed. Pill box filled. I mentioned looking into exercise program for Danielle [name]  she was agreeable. I will look into same. Home visit complete.   CBG- 434  Weight- 215lbs    Refills: Torsemide      Future Appointments  Date Time Provider [place]  09/01/2019  7:20 AM [facility]  09/01/2019  1:45 PM Constance Haw, MD [facility]  12/01/2019  7:20 AM [facility]  03/01/2020  7:20 AM [facility]     ACTION: Home visit completed Next visit planned for one week

## 2019-08-27 ENCOUNTER — Other Ambulatory Visit (HOSPITAL_COMMUNITY): Payer: Self-pay | Admitting: Internal Medicine

## 2019-08-27 ENCOUNTER — Other Ambulatory Visit (HOSPITAL_COMMUNITY): Payer: Self-pay | Admitting: Cardiology

## 2019-08-27 ENCOUNTER — Other Ambulatory Visit (HOSPITAL_COMMUNITY): Payer: Self-pay

## 2019-08-27 NOTE — Progress Notes (Signed)
Paramedicine Encounter    Patient ID: Danielle [name], female    DOB: May 23, 1946, 74 y.o.   MRN: [id number]   Patient Care Team: Seward Carol, MD as PCP - General (Internal Medicine) Jorge Ny, LCSW as Social Worker (Licensed Clinical Social Worker)  Patient Active Problem List   Diagnosis Date Noted  . Type 2 diabetes mellitus with hyperglycemia, with long-term current use of insulin ([place]) 05/14/2019  . Diabetes mellitus ([place]) 05/14/2019  . 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, with long-term current use of insulin ([place]) 05/14/2019  . Type 2 diabetes mellitus with diabetic polyneuropathy, with long-term current use of insulin ([place]) 05/14/2019  . Encephalopathy acute 05/20/2018  . 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 Acute kidney failure ([place]) 05/15/2018  . Primary localized osteoarthritis of right knee 05/13/2018  . (HFpEF) heart failure with preserved ejection fraction ([place]) 12/20/2017  . Obesity (BMI 30-39.9) 11/11/2017  . OSA (obstructive sleep apnea) 02/18/2017  . Essential hypertension   . Diabetes mellitus without complication ([place])   . Cancer ([place])   . Thyroid disease     Current Outpatient Medications:  .  acetaminophen (TYLENOL) 500 MG tablet, Take 500-1,000 mg by mouth every 6 (six) hours as needed (for pain or headaches)., Disp: , Rfl:  .  allopurinol (ZYLOPRIM) 100 MG tablet, Take 1 tablet (100 mg total) by mouth daily., Disp: , Rfl:  .  apixaban (ELIQUIS) 5 MG TABS tablet, Take 1 tablet (5 mg total) by mouth 2 (two) times daily., Disp: 180 tablet, Rfl: 3 .  atorvastatin (LIPITOR) 80 MG tablet, Take 1 tablet (80 mg total) by mouth at bedtime., Disp: 30 tablet, Rfl: 5 .  Biotin 10 MG CAPS, Take 10 mg by mouth daily., Disp: , Rfl:  .  Blood Glucose Monitoring Suppl (ACCU-CHEK AVIVA) device, by Other route. Use as instructed, Disp: , Rfl:  .  calcium citrate (CALCITRATE - DOSED IN MG ELEMENTAL CALCIUM) 950 MG tablet,  Take 1 tablet by mouth 2 (two) times daily. , Disp: , Rfl:  .  carvedilol (COREG) 25 MG tablet, TAKE 1 TABLET BY MOUTH TWICE DAILY WITH MEALS, Disp: 60 tablet, Rfl: 2 .  clotrimazole-betamethasone (LOTRISONE) lotion, APPLY LOTION TOPICALLY TWICE DAILY FOR 7 DAYS, Disp: , Rfl:  .  colchicine 0.6 MG tablet, TAKE 1 TABLET BY MOUTH EVERY OTHER DAY TO PREVENT FLARES, Disp: , Rfl:  .  ferrous sulfate 325 (65 FE) MG tablet, Take 325 mg by mouth 2 (two) times daily with a meal., Disp: , Rfl:  .  gabapentin (NEURONTIN) 300 MG capsule, Take 300 mg by mouth daily. , Disp: , Rfl:  .  glucose blood test strip, Use as instructed to test blood sugars 3 times daily E11.65  Freestyle lite, Disp: 400 each, Rfl: 3 .  insulin glargine (LANTUS) 100 UNIT/ML injection, Inject 88 Units into the skin daily., Disp: , Rfl:  .  Insulin Lispro (HUMALOG KWIKPEN) 200 UNIT/ML SOPN, Inject 24 Units into the skin 3 (three) times daily., Disp: 27 mL, Rfl: 3 .  Insulin Pen Needle 32G X 4 MM MISC, 1 Device by Does not apply route 4 (four) times daily., Disp: 400 each, Rfl: 3 .  isosorbide mononitrate (IMDUR) 30 MG 24 hr tablet, TAKE 1 TABLET BY MOUTH ONCE DAILY(CALL FOR OFFICE VISIT [phone]), Disp: 30 tablet, Rfl: 0 .  JARDIANCE 10 MG TABS tablet, Take 1 tablet by mouth once daily, Disp: 90 tablet, Rfl: 0 .  levothyroxine (  SYNTHROID, LEVOTHROID) 75 MCG tablet, Take 75 mcg by mouth daily before breakfast., Disp: , Rfl:  .  losartan (COZAAR) 50 MG tablet, TAKE 1 TABLET BY MOUTH AT BEDTIME(CALL OFFICE FOR VISIT [phone]), Disp: 30 tablet, Rfl: 0 .  Multiple Vitamins-Minerals (ALIVE ONCE DAILY WOMENS 50+ PO), Take 1 tablet by mouth daily., Disp: , Rfl:  .  omeprazole (PRILOSEC) 20 MG capsule, Take 20 mg by mouth daily. , Disp: , Rfl:  .  potassium chloride (K-DUR) 10 MEQ tablet, Take 2 tablets by mouth once daily, Disp: 60 tablet, Rfl: 0 .  spironolactone (ALDACTONE) 25 MG tablet, Take 1 tablet (25 mg total) by mouth every other  day. Needs appt for further refills, Disp: 30 tablet, Rfl: 2 .  torsemide (DEMADEX) 20 MG tablet, Take 2 tablets (40 mg total) by mouth daily. Please call for office visit [phone], Disp: 60 tablet, Rfl: 0 .  Travoprost, BAK Free, (TRAVATAN) 0.004 % SOLN ophthalmic solution, Place 1 drop into both eyes at bedtime. , Disp: , Rfl:  .  CHONDROITIN SULFATE PO, Take 250 mg by mouth daily., Disp: , Rfl:  .  nitroGLYCERIN (NITROSTAT) 0.4 MG SL tablet, DISSOLVE ONE TABLET UNDER THE TONGUE EVERY 5 MINUTES AS NEEDED FOR CHEST PAIN. (Patient not taking: Reported on 06/04/2019), Disp: 25 tablet, Rfl: 3 Allergies  Allergen Reactions  . Other Other (See Comments)    Blood products- Patient won't accept any  . Sulfa Antibiotics Rash  . Uloric [Febuxostat] Rash     Social History   Socioeconomic History  . Marital status: Widowed    Spouse name: Not on file  . Number of children: 3  . Years of education: Not on file  . Highest education level: Some college, no degree  Occupational History  . Occupation: retired  Tobacco Use  . Smoking status: Former Smoker    Packs/day: 1.00    Years: 22.00    Pack years: 22.00    Types: Cigarettes    Quit date: 1983    Years since quitting: 38.2  . Smokeless tobacco: Never Used  Substance and Sexual Activity  . Alcohol use: Yes    Comment: 05/22/2018 "glass of wine couple times/wk"  . Drug use: Not Currently  . Sexual activity: Not Currently    Birth control/protection: None, Post-menopausal  Other Topics Concern  . Not on file  Social History Narrative  . Not on file   Social Determinants of Health   Financial Resource Strain:   . Difficulty of Paying Living Expenses:   Food Insecurity:   . Worried About Charity fundraiser in the Last Year:   . Arboriculturist in the Last Year:   Transportation Needs:   . Film/video editor (Medical):   Marland Kitchen Lack of Transportation (Non-Medical):   Physical Activity:   . Days of Exercise per Week:   .  Minutes of Exercise per Session:   Stress:   . Feeling of Stress :   Social Connections:   . Frequency of Communication with Friends and Family:   . Frequency of Social Gatherings with Friends and Family:   . Attends Religious Services:   . Active Member of Clubs or Organizations:   . Attends Archivist Meetings:   Marland Kitchen Marital Status:   Intimate Partner Violence:   . Fear of Current or Ex-Partner:   . Emotionally Abused:   Marland Kitchen Physically Abused:   . Sexually Abused:     Physical Exam Vitals reviewed.  Constitutional:      Appearance: She is normal weight.  HENT:     Head: Normocephalic.     Nose: Nose normal.     Mouth/Throat:     Mouth: Mucous membranes are moist.     Pharynx: Oropharynx is clear.  Eyes:     Pupils: Pupils are equal, round, and reactive to light.  Cardiovascular:     Rate and Rhythm: Normal rate and regular rhythm.     Pulses: Normal pulses.     Heart sounds: Normal heart sounds.  Pulmonary:     Effort: Pulmonary effort is normal.     Breath sounds: Normal breath sounds.  Abdominal:     Palpations: Abdomen is soft.  Musculoskeletal:        General: Normal range of motion.     Cervical back: Normal range of motion.     Right lower leg: Edema present.     Left lower leg: Edema present.  Skin:    Capillary Refill: Capillary refill takes less than 2 seconds.  Neurological:     Mental Status: She is alert. Mental status is at baseline.  Psychiatric:        Mood and Affect: Mood normal.     Arrived for home visit for [place] who was alert and oriented ambulating around her apartment. Oni denied any pain, shortness of breath, dizziness, chest pain or trouble sleeping/eating. Cova did report some increased bowel movements when taking Ferrous Sulfate. Patient stated she is taking this medication once a day every other day. I updated med list. Vitals were obtained and are as noted in report. Assessment as noted. Edema noted to lower legs.  Kezia stated she has been on her feet a lot the last two days traveling to [place] and back for her birthday! Cerria was advised to rest and elevate feet as most she can over the weekend. She agreed. Medications were reviewed and confirmed. Pill box filled accordingly. [place] will need to be scheduled for visit with HF clinic in the coming weeks due to no refills on Torsemide. I messaged [employer] RN about same. Home visit complete. I will see patient in one week.    Refills: Torsemide Jardiance Isosorbide Potassium Eliquis Allupurinol Insulin Needles  CBG- 212 Weight- 209lbs      Future Appointments  Date Time Provider [place]  09/01/2019  7:20 AM [facility] DEVICE REMOTES [facility]  09/01/2019  1:45 PM Curt Bears, Ocie Doyne, MD [facility]  12/01/2019  7:20 AM [facility] DEVICE REMOTES [facility]  03/01/2020  7:20 AM [facility] DEVICE REMOTES [facility]     ACTION: Home visit completed Next visit planned for one week

## 2019-09-01 ENCOUNTER — Encounter: Payer: Medicare HMO | Admitting: Cardiology

## 2019-09-01 ENCOUNTER — Ambulatory Visit (INDEPENDENT_AMBULATORY_CARE_PROVIDER_SITE_OTHER): Payer: Medicare HMO | Admitting: *Deleted

## 2019-09-01 DIAGNOSIS — R001 Bradycardia, unspecified: Secondary | ICD-10-CM

## 2019-09-01 NOTE — Progress Notes (Deleted)
Electrophysiology Office Note   Date:  09/01/2019   ID:  Danielle [name], DOB 11-09-1945, MRN [id number]  PCP:  Seward Carol, MD  Cardiologist:  [place] Primary Electrophysiologist:  Yochanan Eddleman Meredith Leeds, MD    No chief complaint on file.    History of Present Illness: Danielle [name] is a 73 y.o. female who is being seen today for the evaluation of CHF at the request of Seward Carol, MD. Presenting today for electrophysiology evaluation. Has a history of systolic heart failure with an EF 45-50%, coronary disease, nonischemic cardiomyopathy, hypertension, diabetes, hypothyroidism, and sleep apnea. She had PCI to the LAD in 2007. She was planned for elective right knee replacement February 2016. After induction of anesthesia, the patient became hypotensive and bradycardic. EKG showed ST elevation in the anterior wall. She had an emergent cath and found her LAD stent thrombosed. Her ejection fraction time was 10% which improved to 45-50%. She had greater than 3 second pauses and had a Medtronic dual-chamber pacemaker implanted 07/19/14.   She was admitted 9/9-9/12/18 with shortness of breath and acute on chronic diastolic heart failure. He was diuresed 12 pounds with a discharge weight of 215 pounds.   Today, denies symptoms of palpitations, chest pain, shortness of breath, orthopnea, PND, lower extremity edema, claudication, dizziness, presyncope, syncope, bleeding, or neurologic sequela. The patient is tolerating medications without difficulties. ***   Past Medical History:  Diagnosis Date  . Acute combined systolic and diastolic congestive heart failure ([place]) 02/05/2017  . 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 Acute kidney failure ([place]) 05/15/2018  . Arthritis    "all over" (05/22/2018)  . Blood dyscrasia    per pt-has small blood cells-appears as if anemic  . Breast cancer, left breast ([place]) 1985  . CHF (congestive heart failure) ([place])   .  Complication of anesthesia    difficult to awaken from per pt  . Coronary artery disease   . Dyspnea   . Essential hypertension   . Heart disease   . Heart murmur   . History of gout   . Hypothyroidism   . Obesity (BMI 30-39.9) 11/11/2017  . OSA (obstructive sleep apnea) 02/18/2017    severe obstructive sleep apnea with an AHI of 75.6/h and mild central sleep apnea with a CAI of 7.7/h.  Oxygen saturations dropped as low as 82%.   He is on CPAP at 9 cm H2O.  . OSA on CPAP   . Personal history of chemotherapy   . Personal history of radiation therapy   . Presence of permanent cardiac pacemaker   . Primary localized osteoarthritis of right knee 05/13/2018  . Refusal of blood transfusions as patient is Jehovah's Witness   . Type II diabetes mellitus ([place])    Past Surgical History:  Procedure Laterality Date  . BREAST BIOPSY Left 1985  . BREAST LUMPECTOMY WITH NEEDLE LOCALIZATION AND AXILLARY LYMPH NODE DISSECTION  1985  . CATARACT EXTRACTION W/ INTRAOCULAR LENS  IMPLANT, BILATERAL Bilateral   . CORONARY ANGIOPLASTY WITH STENT PLACEMENT  2010, 2012,2017    2 done 2010 and 1 replaced 2012 and 2 replaced in 2017  . DILATION AND CURETTAGE OF UTERUS    . INSERT / REPLACE / REMOVE PACEMAKER  07/19/2014  . JOINT REPLACEMENT    . REPLACEMENT TOTAL KNEE Left 09/2012  . THYROIDECTOMY, PARTIAL  1978  . TONSILLECTOMY    . TOTAL KNEE ARTHROPLASTY Right 05/13/2018   Procedure: TOTAL KNEE ARTHROPLASTY;  Surgeon: Mardelle Matte,  Vonna Kotyk, MD;  Location: [facility];  Service: Orthopedics;  Laterality: Right;  . TUBAL LIGATION       Current Outpatient Medications  Medication Sig Dispense Refill  . acetaminophen (TYLENOL) 500 MG tablet Take 500-1,000 mg by mouth every 6 (six) hours as needed (for pain or headaches).    Marland Kitchen allopurinol (ZYLOPRIM) 100 MG tablet Take 1 tablet (100 mg total) by mouth daily.    Marland Kitchen apixaban (ELIQUIS) 5 MG TABS tablet Take 1 tablet (5 mg total) by mouth 2 (two) times daily. 180 tablet 3    . atorvastatin (LIPITOR) 80 MG tablet Take 1 tablet (80 mg total) by mouth at bedtime. 30 tablet 5  . Biotin 10 MG CAPS Take 10 mg by mouth daily.    . Blood Glucose Monitoring Suppl (ACCU-CHEK AVIVA) device by Other route. Use as instructed    . calcium citrate (CALCITRATE - DOSED IN MG ELEMENTAL CALCIUM) 950 MG tablet Take 1 tablet by mouth 2 (two) times daily.     . carvedilol (COREG) 25 MG tablet TAKE 1 TABLET BY MOUTH TWICE DAILY WITH MEALS 60 tablet 2  . CHONDROITIN SULFATE PO Take 250 mg by mouth daily.    . clotrimazole-betamethasone (LOTRISONE) lotion APPLY LOTION TOPICALLY TWICE DAILY FOR 7 DAYS    . colchicine 0.6 MG tablet TAKE 1 TABLET BY MOUTH EVERY OTHER DAY TO PREVENT FLARES    . ferrous sulfate 325 (65 FE) MG tablet Take 325 mg by mouth 2 (two) times daily with a meal.    . gabapentin (NEURONTIN) 300 MG capsule Take 300 mg by mouth daily.     Marland Kitchen glucose blood test strip Use as instructed to test blood sugars 3 times daily E11.65  Freestyle lite 400 each 3  . insulin glargine (LANTUS) 100 UNIT/ML injection Inject 88 Units into the skin daily.    . Insulin Lispro (HUMALOG KWIKPEN) 200 UNIT/ML SOPN Inject 24 Units into the skin 3 (three) times daily. 27 mL 3  . Insulin Pen Needle 32G X 4 MM MISC 1 Device by Does not apply route 4 (four) times daily. 400 each 3  . isosorbide mononitrate (IMDUR) 30 MG 24 hr tablet TAKE 1 TABLET BY MOUTH ONCE DAILY -CALL  FOR  OFFICE  VISIT 30 tablet 0  . JARDIANCE 10 MG TABS tablet Take 1 tablet by mouth once daily 30 tablet 0  . levothyroxine (SYNTHROID, LEVOTHROID) 75 MCG tablet Take 75 mcg by mouth daily before breakfast.    . losartan (COZAAR) 50 MG tablet TAKE 1 TABLET BY MOUTH AT BEDTIME(CALL OFFICE FOR VISIT [phone]) 30 tablet 0  . Multiple Vitamins-Minerals (ALIVE ONCE DAILY WOMENS 50+ PO) Take 1 tablet by mouth daily.    . nitroGLYCERIN (NITROSTAT) 0.4 MG SL tablet DISSOLVE ONE TABLET UNDER THE TONGUE EVERY 5 MINUTES AS NEEDED FOR CHEST  PAIN. (Patient not taking: Reported on 06/04/2019) 25 tablet 3  . omeprazole (PRILOSEC) 20 MG capsule Take 20 mg by mouth daily.     . potassium chloride (K-DUR) 10 MEQ tablet Take 2 tablets by mouth once daily 60 tablet 0  . spironolactone (ALDACTONE) 25 MG tablet Take 1 tablet (25 mg total) by mouth every other day. Needs appt for further refills 30 tablet 2  . torsemide (DEMADEX) 20 MG tablet Take 2 tablets (40 mg total) by mouth daily. Please call for office visit [phone] 60 tablet 0  . Travoprost, BAK Free, (TRAVATAN) 0.004 % SOLN ophthalmic solution Place 1 drop into both eyes  at bedtime.      No current facility-administered medications for this visit.    Allergies:   Other, Sulfa antibiotics, and Uloric [febuxostat]   Social History:  The patient  reports that she quit smoking about 38 years ago. Her smoking use included cigarettes. She has a 22.00 pack-year smoking history. She has never used smokeless tobacco. She reports current alcohol use. She reports previous drug use.   Family History:  The patient's family history includes Diabetes in her sister; Heart disease in her brother and father; Multiple myeloma in her mother; Other in her sister and sister.    ROS:  Please see the history of present illness.   Otherwise, review of systems is positive for ***.   All other systems are reviewed and negative.   PHYSICAL EXAM: VS:  There were no vitals taken for this visit. , BMI There is no height or weight on file to calculate BMI. GEN: Well nourished, well developed, in no acute distress  HEENT: normal  Neck: no JVD, carotid bruits, or masses Cardiac: ***RRR; no murmurs, rubs, or gallops,no edema  Respiratory:  clear to auscultation bilaterally, normal work of breathing GI: soft, nontender, nondistended, + BS MS: no deformity or atrophy  Skin: warm and dry, device site well healed Neuro:  Strength and sensation are intact Psych: euthymic mood, full affect  EKG:  EKG {ACTION;  IS/IS [id number]} ordered today. Personal review of the ekg ordered *** shows ***  Personal review of the device interrogation today. Results in [facility]: 03/27/2019: BUN 32; Creatinine, Ser 1.68; Potassium 3.6; Sodium 139    Lipid Panel     Component Value Date/Time   CHOL 87 03/06/2017 1128   TRIG 123 03/06/2017 1128   HDL 38 (L) 03/06/2017 1128   CHOLHDL 2.3 03/06/2017 1128   VLDL 25 03/06/2017 1128   LDLCALC 24 03/06/2017 1128     Wt Readings from Last 3 Encounters:  08/27/19 209 lb (94.8 kg)  08/20/19 215 lb (97.5 kg)  08/13/19 225 lb (102.1 kg)      Other studies Reviewed: Additional studies/ records that were reviewed today include: TTE 02/04/17  Review of the above records today demonstrates:  - Left ventricle: The cavity size was normal. Wall thickness was   increased in a pattern of mild LVH. Systolic function was mildly   reduced. The estimated ejection fraction was in the range of 45%   to 50%. Features are consistent with a pseudonormal left   ventricular filling pattern, with concomitant abnormal relaxation   and increased filling pressure (grade 2 diastolic dysfunction).   Doppler parameters are consistent with high ventricular filling   pressure. - Aortic valve: There was trivial regurgitation. - Mitral valve: Calcified annulus. Mildly thickened leaflets .   There was mild regurgitation. - Left atrium: The atrium was severely dilated. - Pulmonary arteries: PA peak pressure: 44 mm Hg (S).  Left heart cath 02/13/16 Left main normal LAD multiple stents in the mid to distal LAD. Mid LAD 80% stenosis distal LAD 99% stenosis, drug-eluting stent placed. Proximal LAD 30% Mid circumflex 50%, FFR 0.94 Proximal RCA 25%, mid RCA 25%, PDA 30%  ASSESSMENT AND PLAN:  1.  Chronic systolic heart failure due to ischemic cardiomyopathy: Already to 45% with a restrictive pattern.  Currently on carvedilol, losartan. ***  2. Coronary artery disease with  chronic stable angina: Status post LAD stent in 2010, 2012, 2016.  Currently on aspirin.  No current chest pain.  3. Hypertension: ***  4. Sick sinus syndrome: Status post Medtronic dual-chamber pacemaker.  Device functioning appropriately.  No changes.    5. Obstructive sleep apnea: CPAP compliance encouraged  6.   nonsustained ventricular tachycardia: Found on device interrogation.  Currently on carvedilol.  ***  Current medicines are reviewed at length with the patient today.   The patient does not have concerns regarding her medicines.  The following changes were made today:  ***  Labs/ tests ordered today include:  No orders of the defined types were placed in this encounter.    Disposition:   FU with Memorie [place] *** months  Signed, Tika Hannis Meredith Leeds, MD  09/01/2019 10:42 AM     [facility] [address] [place] [address] [phone] (office) [phone] (fax)

## 2019-09-03 ENCOUNTER — Other Ambulatory Visit (HOSPITAL_COMMUNITY): Payer: Self-pay | Admitting: *Deleted

## 2019-09-03 ENCOUNTER — Other Ambulatory Visit (HOSPITAL_COMMUNITY): Payer: Self-pay | Admitting: Internal Medicine

## 2019-09-03 ENCOUNTER — Other Ambulatory Visit (HOSPITAL_COMMUNITY): Payer: Self-pay

## 2019-09-03 LAB — CUP PACEART REMOTE DEVICE CHECK
Battery Remaining Longevity: 71 mo
Battery Voltage: 3 V
Brady Statistic AP VP Percent: 0.01 %
Brady Statistic AP VS Percent: 4.8 %
Brady Statistic AS VP Percent: 0.05 %
Brady Statistic AS VS Percent: 95.15 %
Brady Statistic RA Percent Paced: 4.8 %
Brady Statistic RV Percent Paced: 0.05 %
Date Time Interrogation Session: 20210407184630
Implantable Lead Implant Date: 20160222
Implantable Lead Implant Date: 20160222
Implantable Lead Location: 753859
Implantable Lead Location: 753860
Implantable Lead Model: 5076
Implantable Lead Model: 5076
Implantable Pulse Generator Implant Date: 20160222
Lead Channel Impedance Value: 418 Ohm
Lead Channel Impedance Value: 418 Ohm
Lead Channel Impedance Value: 437 Ohm
Lead Channel Impedance Value: 513 Ohm
Lead Channel Pacing Threshold Amplitude: 0.75 V
Lead Channel Pacing Threshold Amplitude: 0.75 V
Lead Channel Pacing Threshold Pulse Width: 0.4 ms
Lead Channel Pacing Threshold Pulse Width: 0.4 ms
Lead Channel Sensing Intrinsic Amplitude: 19.25 mV
Lead Channel Sensing Intrinsic Amplitude: 4.875 mV
Lead Channel Setting Pacing Amplitude: 2 V
Lead Channel Setting Pacing Amplitude: 2 V
Lead Channel Setting Pacing Pulse Width: 0.4 ms
Lead Channel Setting Sensing Sensitivity: 2 mV

## 2019-09-03 MED ORDER — TORSEMIDE 20 MG PO TABS: 40.0000 mg | ORAL_TABLET | Freq: Every day | ORAL | 3 refills | Status: DC

## 2019-09-03 MED ORDER — POTASSIUM CHLORIDE CRYS ER 10 MEQ PO TBCR: 20.0000 meq | EXTENDED_RELEASE_TABLET | Freq: Every day | ORAL | 3 refills | Status: DC

## 2019-09-03 MED ORDER — LOSARTAN POTASSIUM 50 MG PO TABS: ORAL_TABLET | ORAL | 3 refills | Status: DC

## 2019-09-03 NOTE — Progress Notes (Signed)
Paramedicine Encounter    Patient ID: Danielle [name], female    DOB: [birth year], 74 y.o.   MRN: [id number]   Patient Care Team: Seward Carol, MD as PCP - General (Internal Medicine) Constance Haw, MD as PCP - Electrophysiology (Cardiology) Jorge Ny, LCSW as Social Worker (Licensed Clinical Social Worker)  Patient Active Problem List   Diagnosis Date Noted  . Type 2 diabetes mellitus with hyperglycemia, with long-term current use of insulin ([place]) 05/14/2019  . Diabetes mellitus ([place]) 05/14/2019  . 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, with long-term current use of insulin ([place]) 05/14/2019  . Type 2 diabetes mellitus with diabetic polyneuropathy, with long-term current use of insulin ([place]) 05/14/2019  . Encephalopathy acute 05/20/2018  . 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 Acute kidney failure ([place]) 05/15/2018  . Primary localized osteoarthritis of right knee 05/13/2018  . (HFpEF) heart failure with preserved ejection fraction ([place]) 12/20/2017  . Obesity (BMI 30-39.9) 11/11/2017  . OSA (obstructive sleep apnea) 02/18/2017  . Essential hypertension   . Diabetes mellitus without complication ([place])   . Cancer ([place])   . Thyroid disease     Current Outpatient Medications:  .  acetaminophen (TYLENOL) 500 MG tablet, Take 500-1,000 mg by mouth every 6 (six) hours as needed (for pain or headaches)., Disp: , Rfl:  .  allopurinol (ZYLOPRIM) 100 MG tablet, Take 1 tablet (100 mg total) by mouth daily., Disp: , Rfl:  .  apixaban (ELIQUIS) 5 MG TABS tablet, Take 1 tablet (5 mg total) by mouth 2 (two) times daily., Disp: 180 tablet, Rfl: 3 .  atorvastatin (LIPITOR) 80 MG tablet, Take 1 tablet (80 mg total) by mouth at bedtime., Disp: 30 tablet, Rfl: 5 .  Blood Glucose Monitoring Suppl (ACCU-CHEK AVIVA) device, by Other route. Use as instructed, Disp: , Rfl:  .  calcium citrate (CALCITRATE - DOSED IN MG ELEMENTAL CALCIUM) 950 MG  tablet, Take 1 tablet by mouth 2 (two) times daily. , Disp: , Rfl:  .  carvedilol (COREG) 25 MG tablet, TAKE 1 TABLET BY MOUTH TWICE DAILY WITH MEALS, Disp: 60 tablet, Rfl: 2 .  colchicine 0.6 MG tablet, TAKE 1 TABLET BY MOUTH EVERY OTHER DAY TO PREVENT FLARES, Disp: , Rfl:  .  ferrous sulfate 325 (65 FE) MG tablet, Take 325 mg by mouth 2 (two) times daily with a meal., Disp: , Rfl:  .  gabapentin (NEURONTIN) 300 MG capsule, Take 300 mg by mouth daily. , Disp: , Rfl:  .  glucose blood test strip, Use as instructed to test blood sugars 3 times daily E11.65  Freestyle lite, Disp: 400 each, Rfl: 3 .  insulin glargine (LANTUS) 100 UNIT/ML injection, Inject 88 Units into the skin daily., Disp: , Rfl:  .  Insulin Lispro (HUMALOG KWIKPEN) 200 UNIT/ML SOPN, Inject 24 Units into the skin 3 (three) times daily., Disp: 27 mL, Rfl: 3 .  Insulin Pen Needle 32G X 4 MM MISC, 1 Device by Does not apply route 4 (four) times daily., Disp: 400 each, Rfl: 3 .  isosorbide mononitrate (IMDUR) 30 MG 24 hr tablet, TAKE 1 TABLET BY MOUTH ONCE DAILY -CALL  FOR  OFFICE  VISIT, Disp: 30 tablet, Rfl: 0 .  JARDIANCE 10 MG TABS tablet, Take 1 tablet by mouth once daily, Disp: 30 tablet, Rfl: 0 .  levothyroxine (SYNTHROID, LEVOTHROID) 75 MCG tablet, Take 75 mcg by mouth daily before breakfast., Disp: , Rfl:  .  losartan (COZAAR)  50 MG tablet, TAKE 1 TABLET BY MOUTH AT BEDTIME, Disp: 30 tablet, Rfl: 3 .  Multiple Vitamins-Minerals (ALIVE ONCE DAILY WOMENS 50+ PO), Take 1 tablet by mouth daily., Disp: , Rfl:  .  omeprazole (PRILOSEC) 20 MG capsule, Take 20 mg by mouth daily. , Disp: , Rfl:  .  potassium chloride (KLOR-CON) 10 MEQ tablet, Take 2 tablets (20 mEq total) by mouth daily., Disp: 60 tablet, Rfl: 3 .  spironolactone (ALDACTONE) 25 MG tablet, Take 1 tablet (25 mg total) by mouth every other day. Needs appt for further refills, Disp: 30 tablet, Rfl: 2 .  torsemide (DEMADEX) 20 MG tablet, Take 2 tablets (40 mg total) by mouth  daily. Please call for office visit [phone], Disp: 60 tablet, Rfl: 3 .  Travoprost, BAK Free, (TRAVATAN) 0.004 % SOLN ophthalmic solution, Place 1 drop into both eyes at bedtime. , Disp: , Rfl:  .  Biotin 10 MG CAPS, Take 10 mg by mouth daily., Disp: , Rfl:  .  CHONDROITIN SULFATE PO, Take 250 mg by mouth daily., Disp: , Rfl:  .  clotrimazole-betamethasone (LOTRISONE) lotion, APPLY LOTION TOPICALLY TWICE DAILY FOR 7 DAYS, Disp: , Rfl:  .  nitroGLYCERIN (NITROSTAT) 0.4 MG SL tablet, DISSOLVE ONE TABLET UNDER THE TONGUE EVERY 5 MINUTES AS NEEDED FOR CHEST PAIN. (Patient not taking: Reported on 06/04/2019), Disp: 25 tablet, Rfl: 3 Allergies  Allergen Reactions  . Other Other (See Comments)    Blood products- Patient won't accept any  . Sulfa Antibiotics Rash  . Uloric [Febuxostat] Rash     Social History   Socioeconomic History  . Marital status: Widowed    Spouse name: Not on file  . Number of children: 3  . Years of education: Not on file  . Highest education level: Some college, no degree  Occupational History  . Occupation: retired  Tobacco Use  . Smoking status: Former Smoker    Packs/day: 1.00    Years: 22.00    Pack years: 22.00    Types: Cigarettes    Quit date: 1983    Years since quitting: 38.2  . Smokeless tobacco: Never Used  Substance and Sexual Activity  . Alcohol use: Yes    Comment: 05/22/2018 "glass of wine couple times/wk"  . Drug use: Not Currently  . Sexual activity: Not Currently    Birth control/protection: None, Post-menopausal  Other Topics Concern  . Not on file  Social History Narrative  . Not on file   Social Determinants of Health   Financial Resource Strain:   . Difficulty of Paying Living Expenses:   Food Insecurity:   . Worried About Charity fundraiser in the Last Year:   . Arboriculturist in the Last Year:   Transportation Needs:   . Film/video editor (Medical):   Marland Kitchen Lack of Transportation (Non-Medical):   Physical Activity:    . Days of Exercise per Week:   . Minutes of Exercise per Session:   Stress:   . Feeling of Stress :   Social Connections:   . Frequency of Communication with Friends and Family:   . Frequency of Social Gatherings with Friends and Family:   . Attends Religious Services:   . Active Member of Clubs or Organizations:   . Attends Archivist Meetings:   Marland Kitchen Marital Status:   Intimate Partner Violence:   . Fear of Current or Ex-Partner:   . Emotionally Abused:   Marland Kitchen Physically Abused:   . Sexually  Abused:     Physical Exam Vitals reviewed.  Constitutional:      Appearance: She is normal weight.  HENT:     Head: Normocephalic.     Nose: Nose normal.     Mouth/Throat:     Mouth: Mucous membranes are moist.  Eyes:     Pupils: Pupils are equal, round, and reactive to light.  Cardiovascular:     Rate and Rhythm: Normal rate and regular rhythm.     Pulses: Normal pulses.     Heart sounds: Normal heart sounds.  Pulmonary:     Effort: Pulmonary effort is normal.     Breath sounds: Normal breath sounds.  Abdominal:     Palpations: Abdomen is soft.  Musculoskeletal:        General: Normal range of motion.     Cervical back: Normal range of motion.     Right lower leg: Edema present.     Left lower leg: Edema present.  Skin:    General: Skin is warm and dry.     Capillary Refill: Capillary refill takes less than 2 seconds.  Neurological:     Mental Status: She is alert. Mental status is at baseline.  Psychiatric:        Mood and Affect: Mood normal.    Arrived for home visit for [place] who was alert and oriented seated in her home. Kamla reports she is feeling fine and had no complaints. Vitals were obtained. Medications were reviewed and confirmed. Two pill boxes were filled. Some meds missing from pill boxes. Medications noted out of refills, I contacted HF clinic and they were able to send in refills for medicines. Patient reports she will pick same up. Instructions  left with patient on where to place medications. Patient stated she will be going to [place] for a family emergency and requests a visit in two weeks. Home visit complete.  CBG- 84 Weight- 211lbs   Refills: Toresemide Allupurinol Potassium Losartan       Future Appointments  Date Time Provider [place]  09/14/2019 11:00 AM [facility] PA/NP [facility] None  09/24/2019  2:30 PM Constance Haw, MD [facility]  12/01/2019  7:20 AM [facility] DEVICE REMOTES [facility]  03/01/2020  7:20 AM [facility] DEVICE REMOTES [facility]     ACTION: Home visit completed Next visit planned for two weeks

## 2019-09-14 ENCOUNTER — Encounter (HOSPITAL_COMMUNITY): Payer: Medicare HMO

## 2019-09-16 ENCOUNTER — Telehealth (HOSPITAL_COMMUNITY): Payer: Self-pay | Admitting: *Deleted

## 2019-09-16 NOTE — Telephone Encounter (Addendum)
Patient referred to Clinical Exercise Physiologist by [facility] program, for guidance and discussion about safe home exercises and/or starting an exercise program. After several attempts to reach patient over the last 2 weeks, patient was finally presented with an information regarding PREP and patient agreed and is interested in beginning the next class. Per her evaluation for readiness and ability for transport, pending PREP order/referral from [name]/Bensimhon, PREP coordinator will contact and intake. All patient's questions were answered and patient was given contact information for further questions or concerns regarding exercise. I will contact follow patient until PREP referral is complete.    Total time spent with patient: 20 minutes  Landis Martins, MS, ACSM, [facility] Clinical Exercise Physiologist/ Health and Wellness Coach

## 2019-09-17 ENCOUNTER — Telehealth (HOSPITAL_COMMUNITY): Payer: Self-pay | Admitting: Cardiology

## 2019-09-17 DIAGNOSIS — I5030 Unspecified diastolic (congestive) heart failure: Secondary | ICD-10-CM

## 2019-09-17 NOTE — Telephone Encounter (Signed)
-----   Message from Conrad Rosebud, NP sent at 09/17/2019  8:23 AM EDT ----- Regarding: FW: Prep Sure how do we do that ?   ----- Message ----- From: Zollie Beckers Sent: 09/16/2019  11:46 AM EDT To: Conrad , NP, Scarlette Calico, RN Subject: FW: Prep                                       Hello!  I have been trying to reach patient for a few weeks and finally connected with her today. Per [name], paramedic, patient would like to participate in exercise program. She is already exercising at home with bands, light weights and is able to transport to [place] for PREP. Can you send in a referral/order for her PREP?   Thanks,  KT ----- Message ----- From: Jeris Penta, EMT Sent: 08/24/2019   8:29 AM EDT To: Zollie Beckers Subject: RE: Prep                                       Perfect, sounds good! Thanks! ----- Message ----- From: Zollie Beckers Sent: 08/21/2019   9:23 AM EDT To: Jeris Penta, EMT Subject: RE: Prep                                       Hello Heather! Yes, I will contact her soon to get something started. If you see her before I am able to reach her, let her know there is not a PREP program class starting for a few weeks now (end April-early May). But we can get her into a virtual PREP that she does from home (at no cost). It includes mostly only exercises, but can get her ready to start the 12 week program when another class opens.   Thanks for the referral! KT ----- Message ----- From: Jeris Penta, EMT Sent: 08/20/2019   5:40 PM EDT To: Zollie Beckers Subject: Prep                                           Vickie Epley, I was wondering if you were able to reach out to Mrs. Ivori Lindsey about the [employer]. Her and her daughter were interested in getting her started in an exercise program. Let me know if she does not qualify and if you are able to reach out or not. I will be back in office Monday or you can text me  [phone]  Thanks!

## 2019-09-18 ENCOUNTER — Telehealth: Payer: Self-pay

## 2019-09-18 NOTE — Telephone Encounter (Signed)
Call placed to patient reference PREP referral.  Not currently in [place] at present. Is interested but wondering if Ins will cover.  Patient agreeable to call me when she returns to [place] to discuss PREP start.

## 2019-09-23 ENCOUNTER — Telehealth (HOSPITAL_COMMUNITY): Payer: Self-pay

## 2019-09-23 NOTE — Telephone Encounter (Signed)
Spoke to Danielle [name] who reports she is still in [place] and is doing fine and plans to be back in the second week in May. Danielle [name] reports she has all of his medicines and is doing okay aside from having some inner ear pain, itching and headaches. She agreed to seek out ENT appointment once she gets back to [place]. Patient will be calling me once she is back home in two weeks. Call complete.

## 2019-09-24 ENCOUNTER — Encounter: Payer: Medicare HMO | Admitting: Cardiology

## 2019-10-05 ENCOUNTER — Encounter (HOSPITAL_COMMUNITY): Payer: Medicare HMO

## 2019-10-09 ENCOUNTER — Other Ambulatory Visit (HOSPITAL_COMMUNITY): Payer: Self-pay | Admitting: Internal Medicine

## 2019-10-09 ENCOUNTER — Other Ambulatory Visit (HOSPITAL_COMMUNITY): Payer: Self-pay

## 2019-10-09 MED ORDER — TORSEMIDE 20 MG PO TABS: 40.0000 mg | ORAL_TABLET | Freq: Every day | ORAL | 0 refills | Status: DC

## 2019-10-09 MED ORDER — APIXABAN 5 MG PO TABS: 5.0000 mg | ORAL_TABLET | Freq: Two times a day (BID) | ORAL | 0 refills | Status: DC

## 2019-10-11 IMAGING — CR DG WRIST COMPLETE 3+V*R*
4 series · 4 of 4 positions shown · non-contrast
Comparison: None.

CLINICAL DATA: Gout.  Wrist pain.  No injury

EXAM:
RIGHT WRIST - COMPLETE 3+ VIEW

[wrist pa]
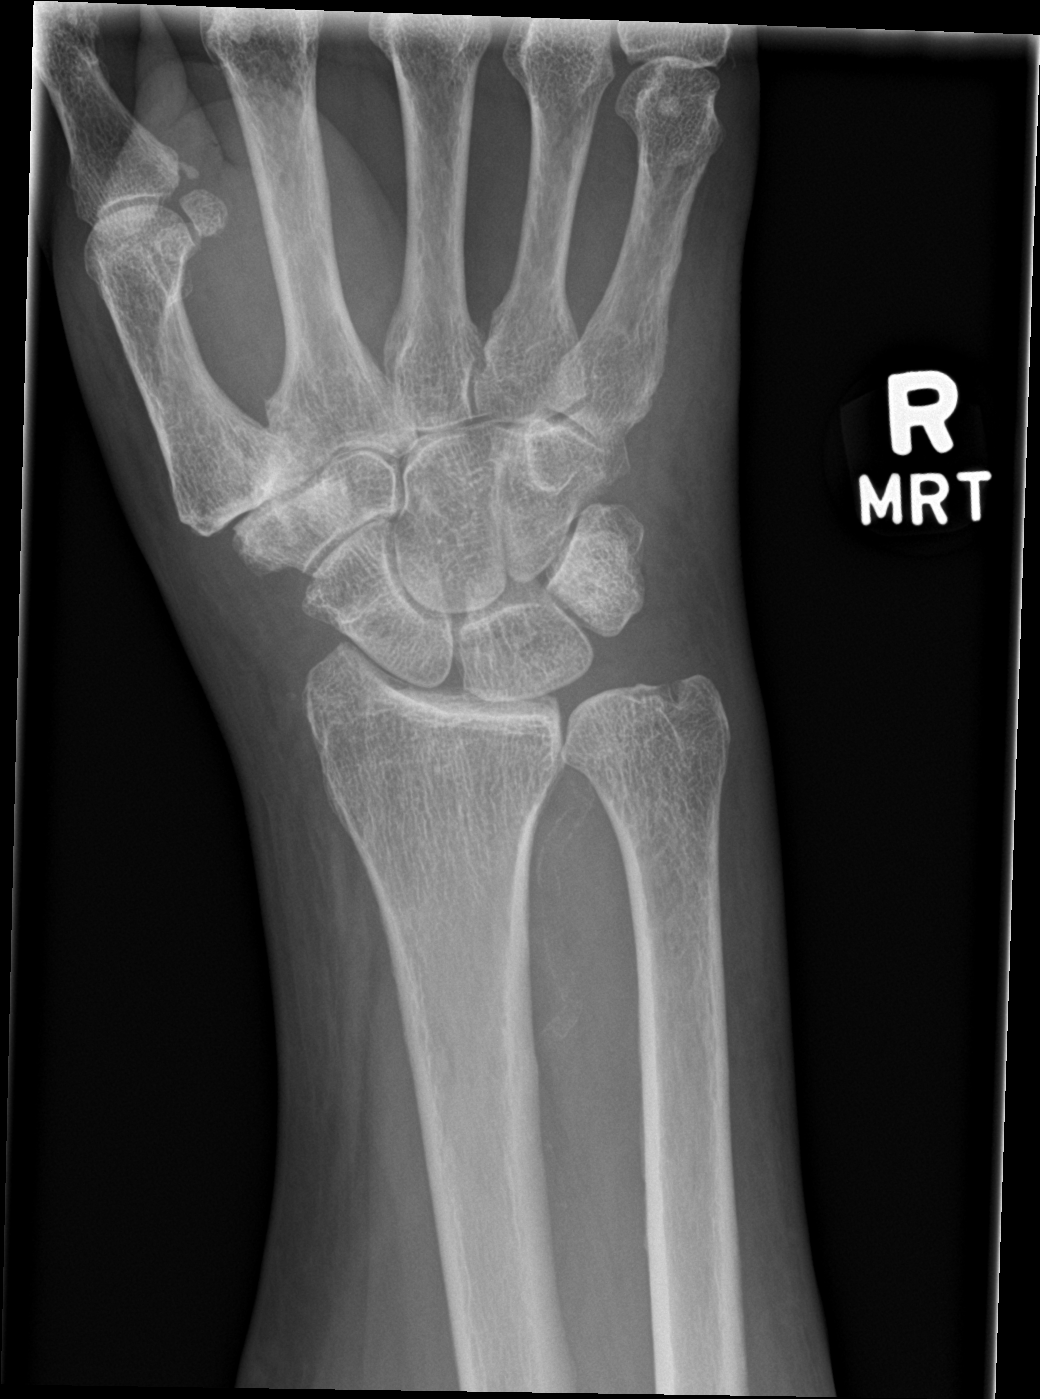

[wrist obl]
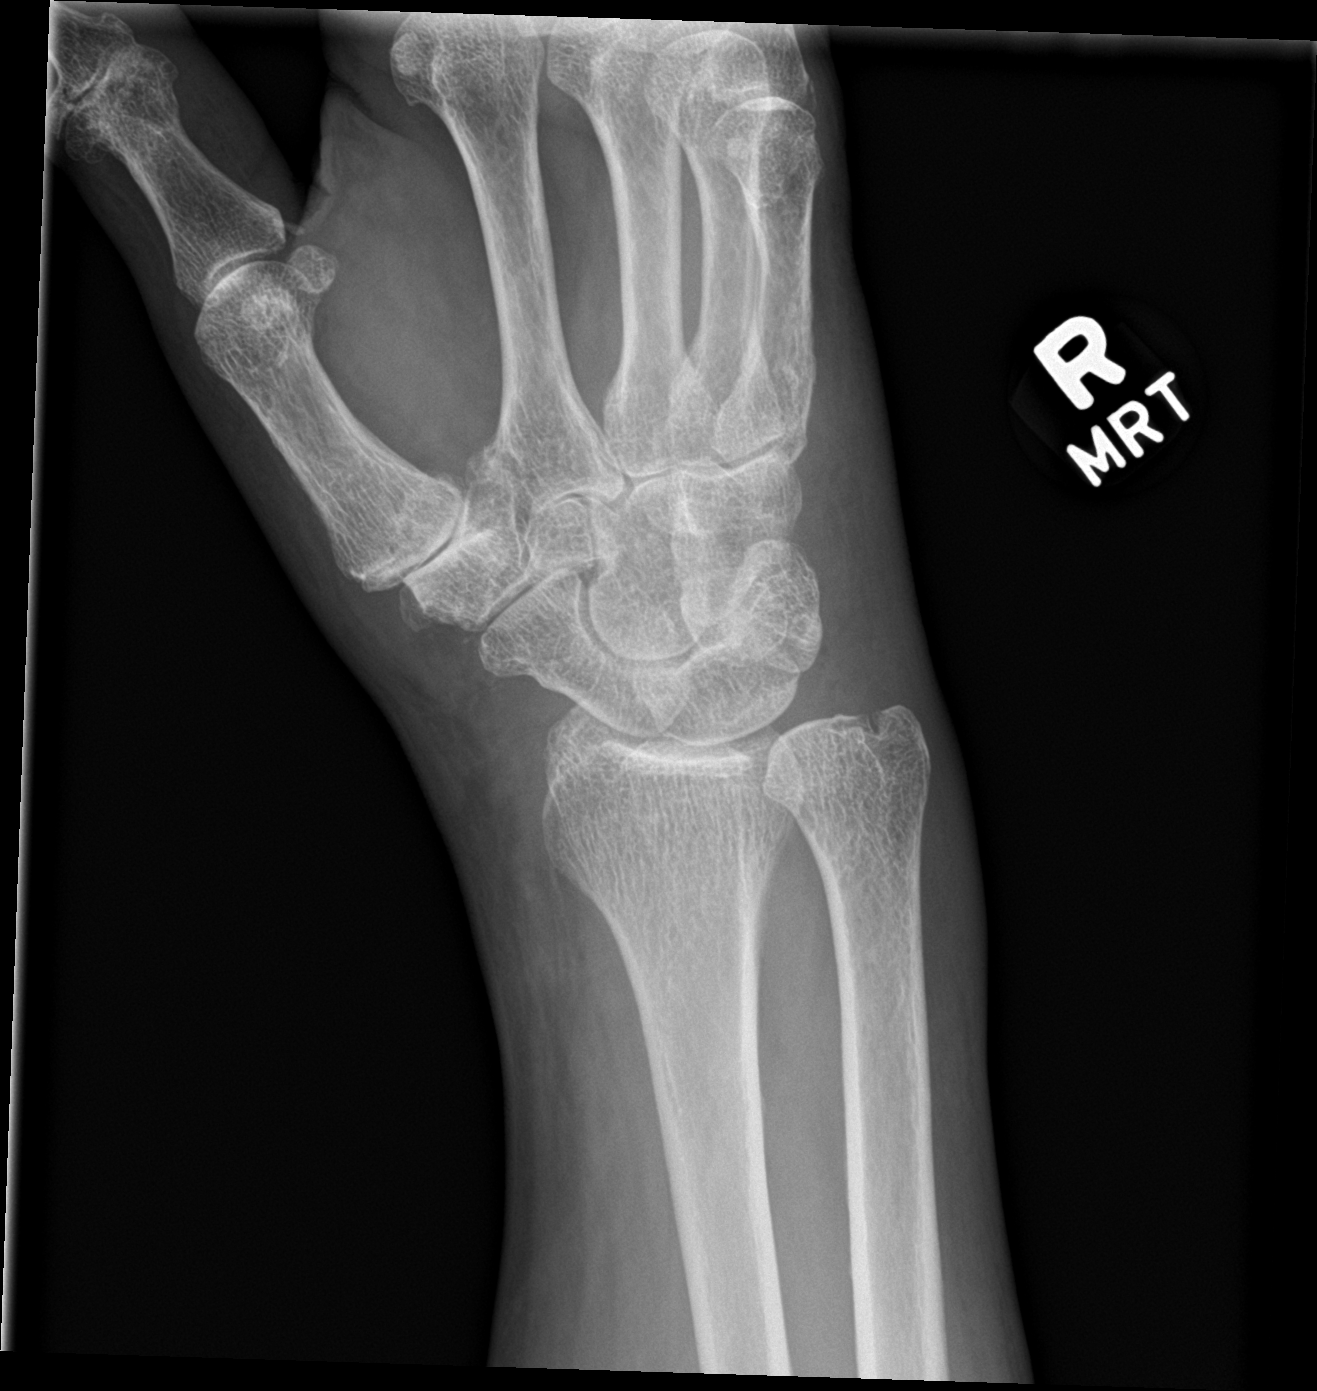

[wrist lat]
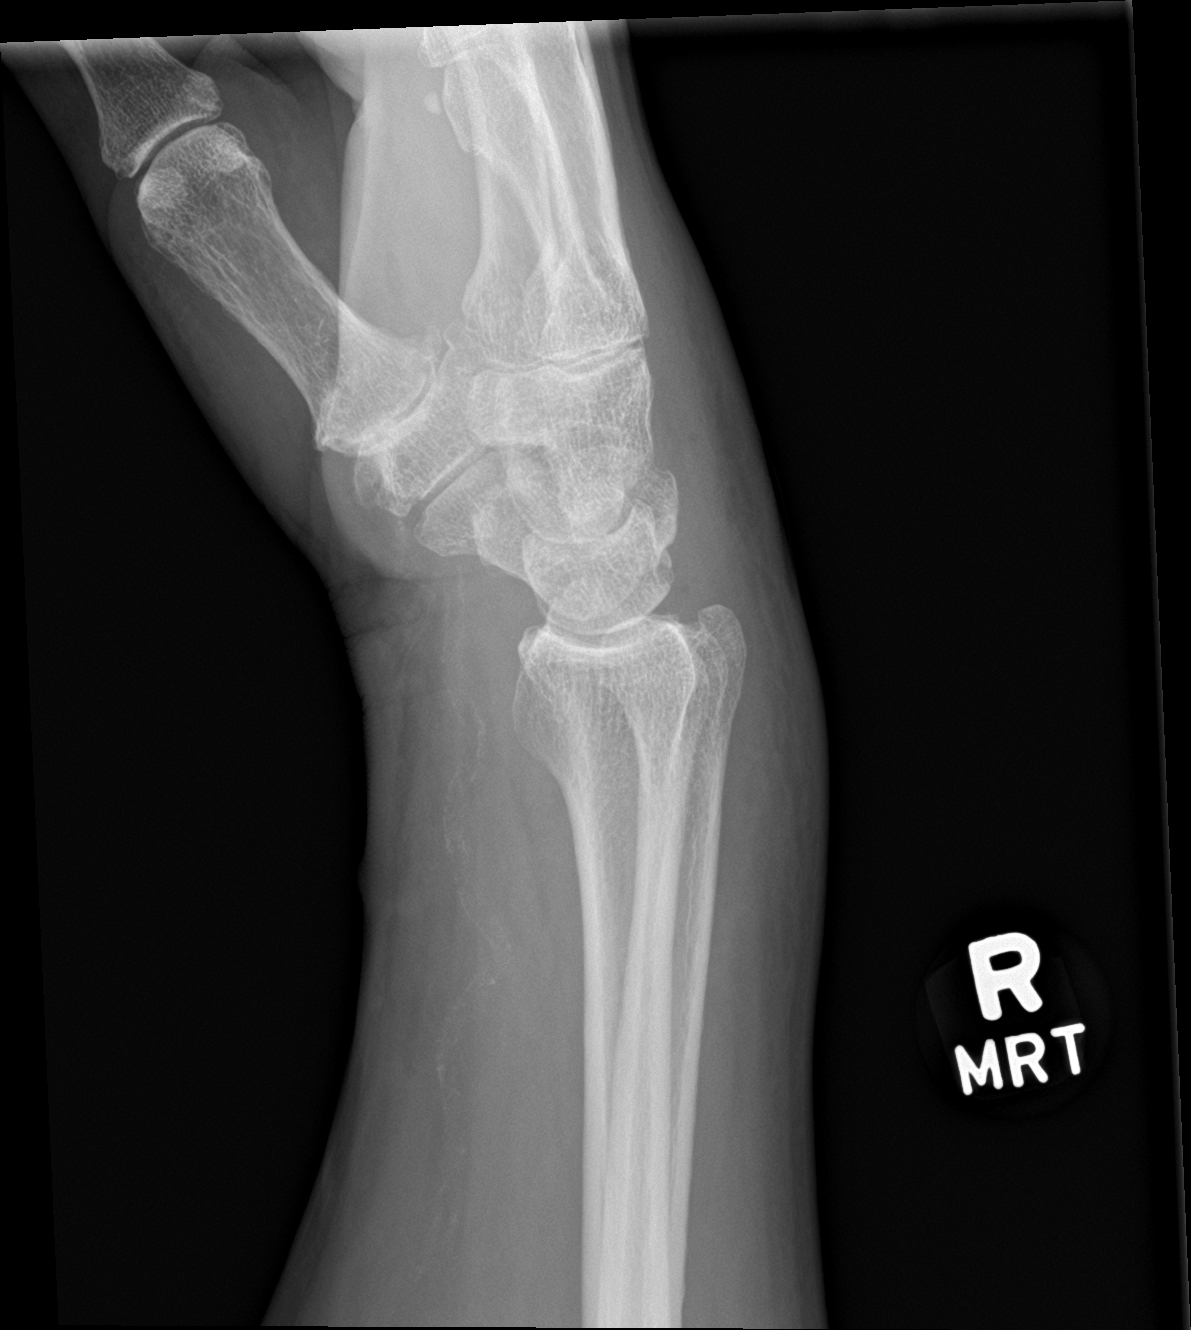

[wrist navicular]
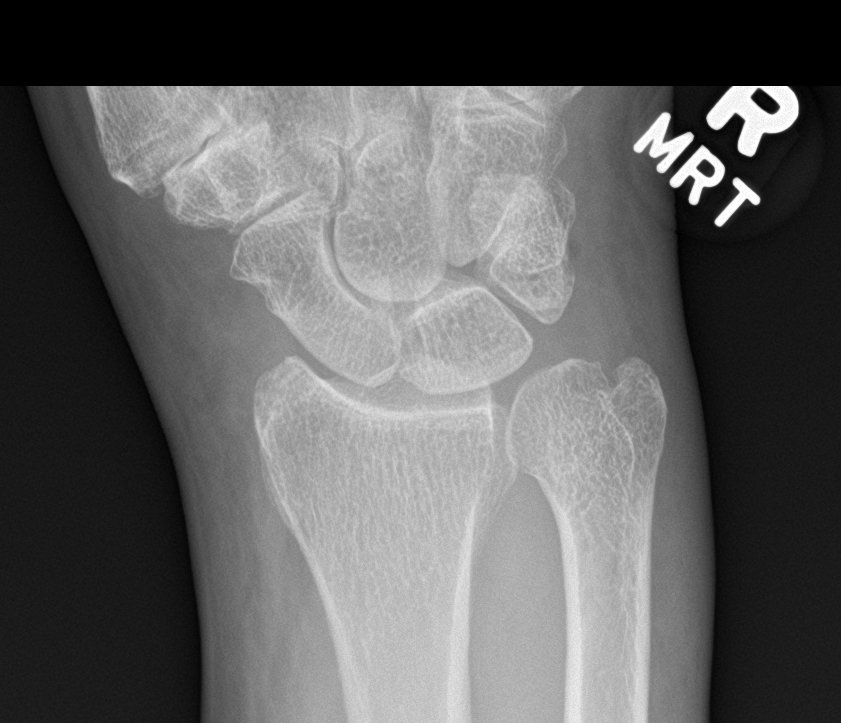

[4 of 4 positions shown; findings below may reference images not displayed]

FINDINGS: Mild degenerative change of the base of the thumb and in the
radiocarpal joint. No erosion or fracture. Diffuse soft tissue
swelling around the wrist. Mild arterial calcification.
IMPRESSION: Diffuse soft tissue swelling. Negative for fracture or erosion. Mild
degenerative change.

## 2019-10-11 IMAGING — DX DG CHEST 1V PORT
1 series · 1 of 1 positions shown · non-contrast
Comparison: 02/03/2017

CLINICAL DATA: Mental status change, generalized weakness and new
onset of incontinence.

EXAM:
PORTABLE CHEST 1 VIEW

[chest ap]
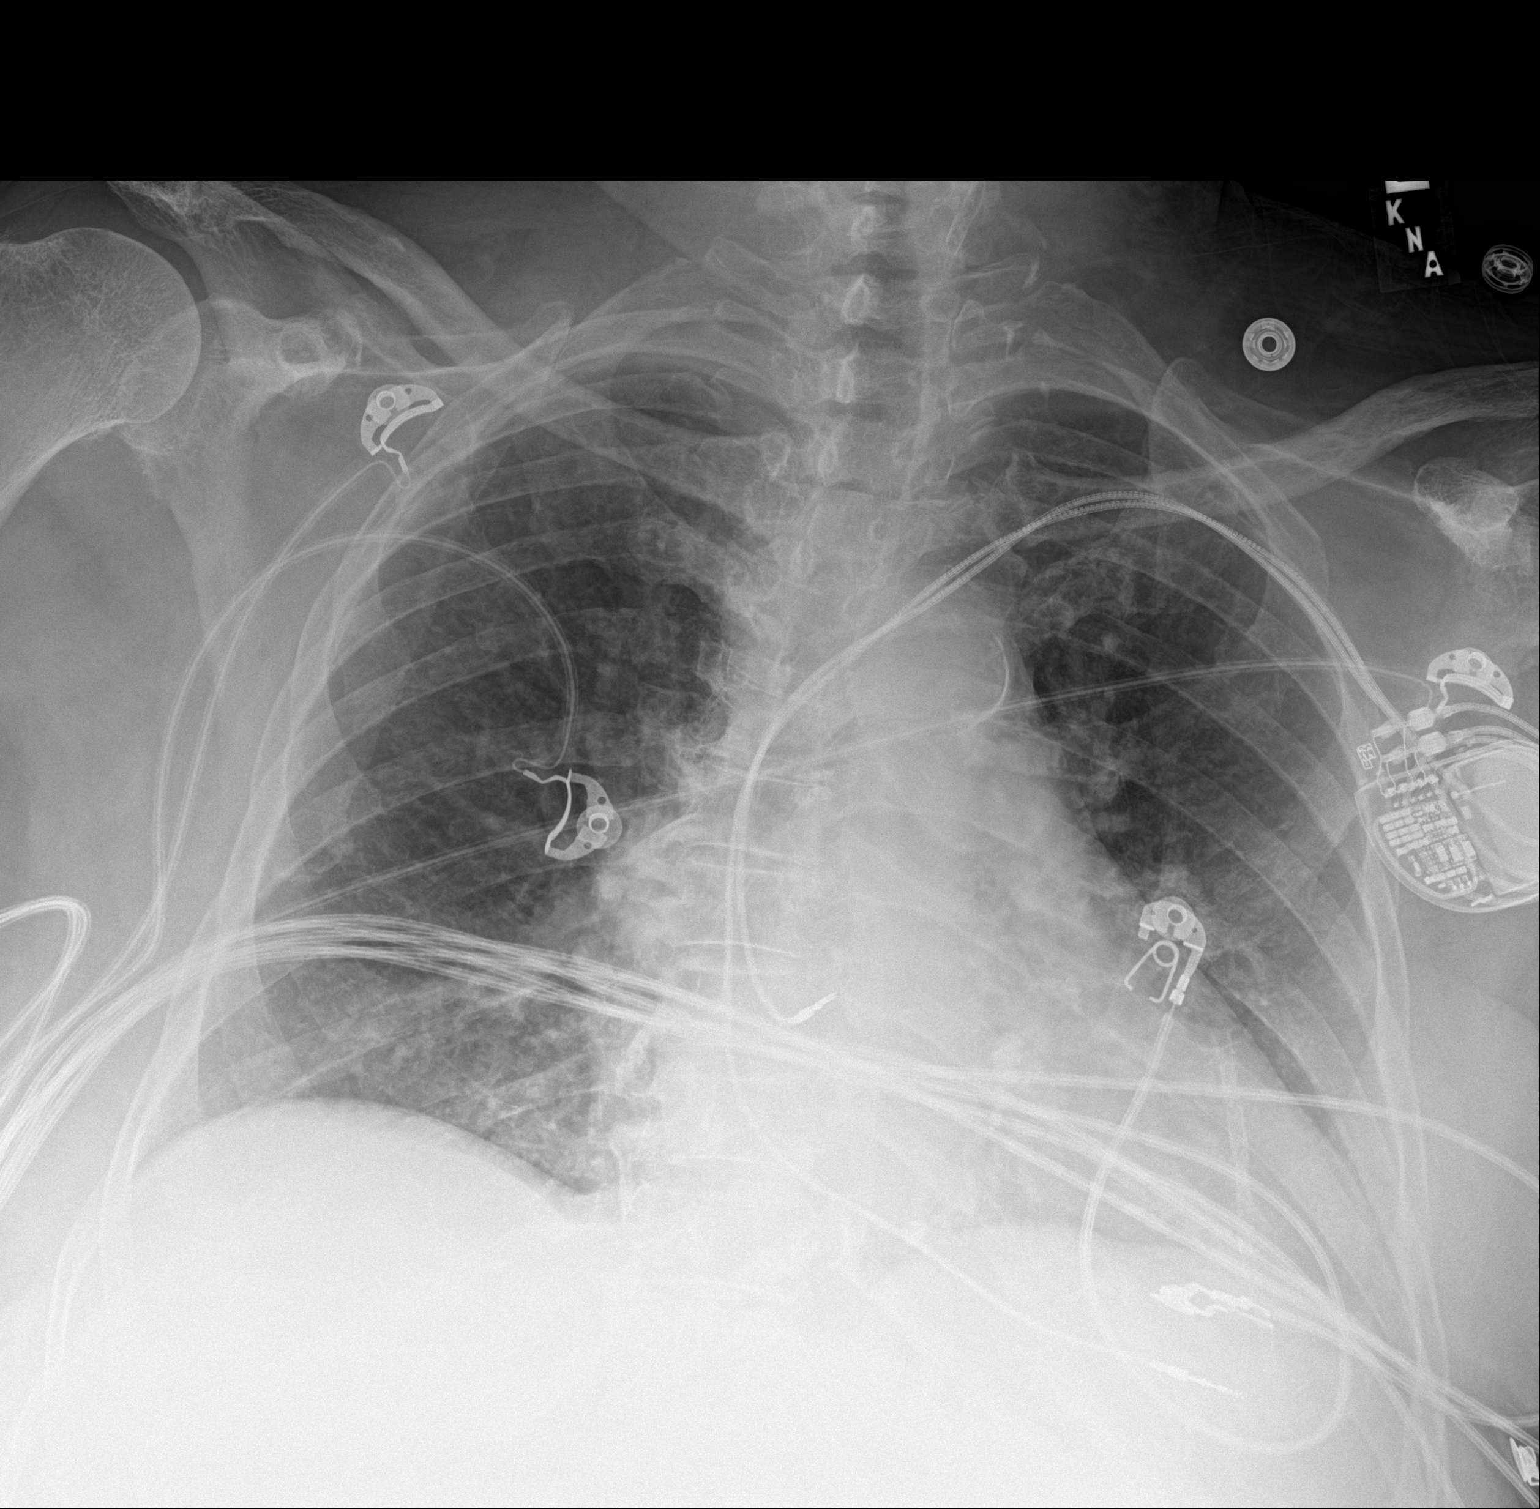

[1 of 1 positions shown; findings below may reference images not displayed]

FINDINGS: Stable cardiomegaly with moderate aortic atherosclerosis. No acute
pulmonary consolidation nor overt pulmonary edema. No effusion or
pneumothorax. Degenerative changes are present about the dorsal
spine and about the included shoulders.
IMPRESSION: Stable cardiomegaly with moderate aortic atherosclerosis. No active
pulmonary disease.

## 2019-10-15 ENCOUNTER — Other Ambulatory Visit: Payer: Self-pay

## 2019-10-19 ENCOUNTER — Other Ambulatory Visit: Payer: Self-pay

## 2019-10-19 ENCOUNTER — Ambulatory Visit (INDEPENDENT_AMBULATORY_CARE_PROVIDER_SITE_OTHER): Payer: Medicare HMO | Admitting: Internal Medicine

## 2019-10-19 ENCOUNTER — Encounter: Payer: Self-pay | Admitting: Internal Medicine

## 2019-10-19 VITALS — BP 124/82 | HR 69 | Temp 98.2°F | Ht 62.0 in | Wt 214.4 lb

## 2019-10-19 DIAGNOSIS — E1165 Type 2 diabetes mellitus with hyperglycemia: Secondary | ICD-10-CM | POA: Diagnosis not present

## 2019-10-19 DIAGNOSIS — E1142 Type 2 diabetes mellitus with diabetic polyneuropathy: Secondary | ICD-10-CM | POA: Diagnosis not present

## 2019-10-19 DIAGNOSIS — Z794 Long term (current) use of insulin: Secondary | ICD-10-CM | POA: Diagnosis not present

## 2019-10-19 DIAGNOSIS — E113599 Type 2 diabetes mellitus with proliferative diabetic retinopathy without macular edema, unspecified eye: Secondary | ICD-10-CM | POA: Diagnosis not present

## 2019-10-19 DIAGNOSIS — E1159 Type 2 diabetes mellitus with other circulatory complications: Secondary | ICD-10-CM

## 2019-10-19 LAB — GLUCOSE, POCT (MANUAL RESULT ENTRY): POC Glucose: 210 mg/dl — AB (ref 70–99)

## 2019-10-19 LAB — POCT GLYCOSYLATED HEMOGLOBIN (HGB A1C): Hemoglobin A1C: 8.8 % — AB (ref 4.0–5.6)

## 2019-10-19 MED ORDER — HUMALOG KWIKPEN 200 UNIT/ML ~~LOC~~ SOPN: 28.0000 [IU] | PEN_INJECTOR | Freq: Three times a day (TID) | SUBCUTANEOUS | 6 refills | Status: DC

## 2019-10-19 NOTE — Patient Instructions (Addendum)
-   Continue Lantus 88 units ONCE daily  - Humalog 28 units with each meal       HOW TO TREAT LOW BLOOD SUGARS (Blood sugar LESS THAN 70 MG/DL)  Please follow the RULE OF 15 for the treatment of hypoglycemia treatment (when your (blood sugars are less than 70 mg/dL)    STEP 1: Take 15 grams of carbohydrates when your blood sugar is low, which includes:   3-4 GLUCOSE TABS  OR  3-4 OZ OF JUICE OR REGULAR SODA OR  ONE TUBE OF GLUCOSE GEL     STEP 2: RECHECK blood sugar in 15 MINUTES STEP 3: If your blood sugar is still low at the 15 minute recheck --> then, go back to STEP 1 and treat AGAIN with another 15 grams of carbohydrates.

## 2019-10-19 NOTE — Progress Notes (Signed)
Name: Danielle [name]  Age/ Sex: 74 y.o., female   MRN/ DOB: [id number], September 09, 1945     PCP: Seward Carol, MD   Reason for Endocrinology Evaluation: Type 2 Diabetes Mellitus  Initial Endocrine Consultative Visit: 05/14/2019    PATIENT IDENTIFIER: Danielle [name] is a 74 y.o. female with a past medical history of T2DM, HTN, Dyslipidemia, CAD, Hx of Breast Ca (S/P chemo and lumpectomy)  . The patient has followed with Endocrinology clinic since 05/14/2019 for consultative assistance with management of her diabetes.  DIABETIC HISTORY:  Danielle [name] was diagnosed with T2DM > 20 yrs ago. Has been on  Metformin in the past which caused diarrhea, has been on prandial insulin in the past  . Her hemoglobin A1c has ranged from 6.9% in 2019, peaking at 8.0% in 2020.  On her initial visit to our clinic her A1c was 11.2% . She was on Jardiance and Lantus 130 units daily. I reduced her lantus to 80 units, added prandial insulin. I did not stop her Jardiance, but I did  strongly recommend that her SGLT-2 inhibitor be discontinued due to CKD III  And deferred this to cardiology as it was started by them.   SUBJECTIVE:   During the last visit (07/06/2019): A1c 11.2%. We decreased her lantus and start Humalog   Today (10/19/2019): Danielle [name] is here for a follow up on diabetes due to hyperglycemia. She checks her blood sugars 1 times daily. The patient has not had hypoglycemic episodes since the last clinic visit. She did not bring her meter today.   Drank juice this morning  Has pain in the hands and feet     ROS: As per HPI and as detailed below:    HOME DIABETES REGIMEN:   Lantus 88 units daily - takes 89 units   Humalog 25 units with each meal   Jardiance 10 mg daily    Statin: Yes ACE-I/ARB:Yes Prior Diabetic Education: yes  METER DOWNLOAD SUMMARY: Did not bring today      DIABETIC COMPLICATIONS: Microvascular complications:   Neuropathy, CKD III, retinopathy (S/P laser)    Last eye exam: Completed 04/2019  Macrovascular complications:   CAD ( S/P MI and stent placement)  Denies: PVD, CVA    HISTORY:  Past Medical History:  Past Medical History:  Diagnosis Date  . Acute combined systolic and diastolic congestive heart failure ([place]) 02/05/2017  . 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 Acute kidney failure ([place]) 05/15/2018  . Arthritis    "all over" (05/22/2018)  . Blood dyscrasia    per pt-has small blood cells-appears as if anemic  . Breast cancer, left breast ([place]) 1985  . CHF (congestive heart failure) ([place])   . Complication of anesthesia    difficult to awaken from per pt  . Coronary artery disease   . Dyspnea   . Essential hypertension   . Heart disease   . Heart murmur   . History of gout   . Hypothyroidism   . Obesity (BMI 30-39.9) 11/11/2017  . OSA (obstructive sleep apnea) 02/18/2017    severe obstructive sleep apnea with an AHI of 75.6/h and mild central sleep apnea with a CAI of 7.7/h.  Oxygen saturations dropped as low as 82%.   He is on CPAP at 9 cm H2O.  . OSA on CPAP   . Personal history of chemotherapy   . Personal history of radiation therapy   . Presence of permanent cardiac pacemaker   .  Primary localized osteoarthritis of right knee 05/13/2018  . Refusal of blood transfusions as patient is Jehovah's Witness   . Type II diabetes mellitus ([place])    Past Surgical History:  Past Surgical History:  Procedure Laterality Date  . BREAST BIOPSY Left 1985  . BREAST LUMPECTOMY WITH NEEDLE LOCALIZATION AND AXILLARY LYMPH NODE DISSECTION  1985  . CATARACT EXTRACTION W/ INTRAOCULAR LENS  IMPLANT, BILATERAL Bilateral   . CORONARY ANGIOPLASTY WITH STENT PLACEMENT  2010, 2012,2017    2 done 2010 and 1 replaced 2012 and 2 replaced in 2017  . DILATION AND CURETTAGE OF UTERUS    . INSERT / REPLACE / REMOVE PACEMAKER  07/19/2014  . JOINT REPLACEMENT    . REPLACEMENT TOTAL KNEE Left 09/2012  .  THYROIDECTOMY, PARTIAL  1978  . TONSILLECTOMY    . TOTAL KNEE ARTHROPLASTY Right 05/13/2018   Procedure: TOTAL KNEE ARTHROPLASTY;  Surgeon: Marchia Bond, MD;  Location: [facility];  Service: Orthopedics;  Laterality: Right;  . TUBAL LIGATION      Social History:  reports that she quit smoking about 38 years ago. Her smoking use included cigarettes. She has a 22.00 pack-year smoking history. She has never used smokeless tobacco. She reports current alcohol use. She reports previous drug use. Family History:  Family History  Problem Relation Age of Onset  . Multiple myeloma Mother   . Heart disease Father   . Other Sister   . Heart disease Brother   . Diabetes Sister   . Other Sister      HOME MEDICATIONS: Allergies as of 10/19/2019      Reactions   Other Other (See Comments)   Blood products- Patient won't accept any   Sulfa Antibiotics Rash   Uloric [febuxostat] Rash      Medication List       Accurate as of Oct 19, 2019  8:55 AM. If you have any questions, ask your nurse or doctor.        Accu-Chek Aviva device by Other route. Use as instructed   acetaminophen 500 MG tablet Commonly known as: TYLENOL Take 500-1,000 mg by mouth every 6 (six) hours as needed (for pain or headaches).   ALIVE ONCE DAILY WOMENS 50+ PO Take 1 tablet by mouth daily.   allopurinol 100 MG tablet Commonly known as: ZYLOPRIM Take 1 tablet (100 mg total) by mouth daily.   apixaban 5 MG Tabs tablet Commonly known as: Eliquis Take 1 tablet (5 mg total) by mouth 2 (two) times daily.   atorvastatin 80 MG tablet Commonly known as: LIPITOR Take 1 tablet (80 mg total) by mouth at bedtime.   Biotin 10 MG Caps Take 10 mg by mouth daily.   calcium citrate 950 (200 Ca) MG tablet Commonly known as: CALCITRATE - dosed in mg elemental calcium Take 1 tablet by mouth 2 (two) times daily.   carvedilol 25 MG tablet Commonly known as: COREG TAKE 1 TABLET BY MOUTH TWICE DAILY WITH MEALS    CHONDROITIN SULFATE PO Take 250 mg by mouth daily.   clotrimazole-betamethasone lotion Commonly known as: LOTRISONE APPLY LOTION TOPICALLY TWICE DAILY FOR 7 DAYS   colchicine 0.6 MG tablet TAKE 1 TABLET BY MOUTH EVERY OTHER DAY TO PREVENT FLARES   ferrous sulfate 325 (65 FE) MG tablet Take 325 mg by mouth 2 (two) times daily with a meal.   gabapentin 300 MG capsule Commonly known as: NEURONTIN Take 300 mg by mouth daily.   glucose blood test strip Use as  instructed to test blood sugars 3 times daily E11.65  Freestyle lite   HumaLOG KwikPen 200 UNIT/ML KwikPen Generic drug: insulin lispro Inject 24 Units into the skin 3 (three) times daily. What changed: how much to take   Insulin Pen Needle 32G X 4 MM Misc 1 Device by Does not apply route 4 (four) times daily.   isosorbide mononitrate 30 MG 24 hr tablet Commonly known as: IMDUR TAKE 1 TABLET BY MOUTH ONCE DAILY -CALL  FOR  OFFICE  VISIT   Jardiance 10 MG Tabs tablet Generic drug: empagliflozin Take 1 tablet by mouth once daily   Lantus 100 UNIT/ML injection Generic drug: insulin glargine Inject 88 Units into the skin daily.   levothyroxine 75 MCG tablet Commonly known as: SYNTHROID Take 75 mcg by mouth daily before breakfast.   losartan 50 MG tablet Commonly known as: COZAAR TAKE 1 TABLET BY MOUTH AT BEDTIME   nitroGLYCERIN 0.4 MG SL tablet Commonly known as: NITROSTAT DISSOLVE ONE TABLET UNDER THE TONGUE EVERY 5 MINUTES AS NEEDED FOR CHEST PAIN.   omeprazole 20 MG capsule Commonly known as: PRILOSEC Take 20 mg by mouth daily.   potassium chloride 10 MEQ tablet Commonly known as: KLOR-CON Take 2 tablets (20 mEq total) by mouth daily.   spironolactone 25 MG tablet Commonly known as: ALDACTONE Take 1 tablet (25 mg total) by mouth every other day. Needs appt for further refills   torsemide 20 MG tablet Commonly known as: DEMADEX Take 2 tablets (40 mg total) by mouth daily. Please call for office visit  [phone]   Travoprost (BAK Free) 0.004 % Soln ophthalmic solution Commonly known as: TRAVATAN Place 1 drop into both eyes at bedtime.        OBJECTIVE:   Vital Signs: BP 124/82 (BP Location: Right Arm, Patient Position: Sitting, Cuff Size: Large)   Pulse 69   Temp 98.2 F (36.8 C)   Ht 5' 2"  (1.575 m)   Wt 214 lb 6.4 oz (97.3 kg)   SpO2 98%   BMI 39.21 kg/m   Wt Readings from Last 3 Encounters:  10/19/19 214 lb 6.4 oz (97.3 kg)  09/03/19 211 lb (95.7 kg)  08/27/19 209 lb (94.8 kg)   PHYSICAL EXAM: VS: BP 124/82 (BP Location: Right Arm, Patient Position: Sitting, Cuff Size: Large)   Pulse 69   Temp 98.2 F (36.8 C)   Ht 5' 2"  (1.575 m)   Wt 214 lb 6.4 oz (97.3 kg)   SpO2 98%   BMI 39.21 kg/m    EXAM: General: Pt appears well and is in NAD  Extremities: Gait and station: normal gait  Digits and nails: no clubbing, cyanosis, petechiae, or nodes Head and neck: normal alignment and mobility BL UE: normal ROM and strength, no joint enlargement or tenderness BL LE: 2+  pretibial edema   Skin: Hair: texture and amount normal with gender appropriate distribution Skin Inspection: no rashes   Mental Status: Judgment, insight: intact Orientation: oriented to time, place, and person Mood and affect: no depression, anxiety, or agitation    DM Foot Exam 10/19/2019 The skin of the feet is intact without sores or ulcerations. The pedal pulses unable to palpate due to edema  The sensation is decreased to a screening 5.07, 10 gram monofilament bilaterally at the toes    DATA REVIEWED:  Lab Results  Component Value Date   HGBA1C 8.8 (A) 10/19/2019   HGBA1C 6.9 (H) 05/20/2018   Lab Results  Component Value Date  LDLCALC 24 03/06/2017   CREATININE 1.68 (H) 03/27/2019     Lab Results  Component Value Date   CHOL 87 03/06/2017   HDL 38 (L) 03/06/2017   LDLCALC 24 03/06/2017   TRIG 123 03/06/2017   CHOLHDL 2.3 03/06/2017         ASSESSMENT / PLAN /  RECOMMENDATIONS:   1)) Type 2 Diabetes Mellitus, with Improved Glycemic control, With Neuropathic, CKD III and Macrovascular  complications - Most recent A1c of 8.8 %. Goal A1c < 7.5 %.   - A1c down from 11.2 % - She misses her basal insulin on average once a week, I have suggested switching the timing to morning , that way she doesn;t fall asleep and not take it.  - This morning she had juice, I have advised he to avoid juice, unless BG is < 70 mg/dL.  Discussed sugar- free or no sugar added options.     MEDICATIONS: Continue Lantus 88 units ONCE daily  Increase Humalog to 28  units with each meal  She is on Jardiance 10 mg daily per cardiology  EDUCATION / INSTRUCTIONS:  BG monitoring instructions: Patient is instructed to check her blood sugars 4 times a day, before each meal and bedtime.  Call [facility] if: BG persistently < 70 or > 300. . I reviewed the Rule of 15 for the treatment of hypoglycemia in detail with the patient. Literature supplied.   F/U in 6 months   Signed electronically by: Mack Guise, MD  [facility]  [address] [place], [place] [address] Phone: [phone] FAX: [phone]   CC: Seward Carol, [address] [address] Phone: [phone]  Fax: [phone]  Return to Endocrinology clinic as below: Future Appointments  Date Time Provider [place]  10/20/2019  8:30 AM [facility] PA/NP [facility] None  11/03/2019  3:45 PM Constance Haw, MD [facility]  12/01/2019  7:20 AM [facility] DEVICE REMOTES [facility]  03/01/2020  7:20 AM [facility] DEVICE REMOTES [facility]

## 2019-10-20 ENCOUNTER — Other Ambulatory Visit (HOSPITAL_COMMUNITY): Payer: Self-pay

## 2019-10-20 ENCOUNTER — Other Ambulatory Visit (HOSPITAL_COMMUNITY): Payer: Self-pay | Admitting: Internal Medicine

## 2019-10-20 ENCOUNTER — Ambulatory Visit (HOSPITAL_COMMUNITY)
Admission: RE | Admit: 2019-10-20 | Discharge: 2019-10-20 | Disposition: A | Discharge status: Home / Self Care | Payer: Medicare HMO | Source: Ambulatory Visit | Attending: Cardiology | Admitting: Cardiology

## 2019-10-20 ENCOUNTER — Encounter (HOSPITAL_COMMUNITY): Payer: Self-pay

## 2019-10-20 VITALS — BP 132/74 | HR 70 | Wt 214.6 lb

## 2019-10-20 DIAGNOSIS — I10 Essential (primary) hypertension: Secondary | ICD-10-CM

## 2019-10-20 DIAGNOSIS — Z9119 Patient's noncompliance with other medical treatment and regimen: Secondary | ICD-10-CM | POA: Insufficient documentation

## 2019-10-20 DIAGNOSIS — M1990 Unspecified osteoarthritis, unspecified site: Secondary | ICD-10-CM | POA: Diagnosis not present

## 2019-10-20 DIAGNOSIS — I480 Paroxysmal atrial fibrillation: Secondary | ICD-10-CM | POA: Insufficient documentation

## 2019-10-20 DIAGNOSIS — I110 Hypertensive heart disease with heart failure: Secondary | ICD-10-CM | POA: Insufficient documentation

## 2019-10-20 DIAGNOSIS — Z8249 Family history of ischemic heart disease and other diseases of the circulatory system: Secondary | ICD-10-CM | POA: Diagnosis not present

## 2019-10-20 DIAGNOSIS — Z833 Family history of diabetes mellitus: Secondary | ICD-10-CM | POA: Insufficient documentation

## 2019-10-20 DIAGNOSIS — I5022 Chronic systolic (congestive) heart failure: Secondary | ICD-10-CM | POA: Diagnosis not present

## 2019-10-20 DIAGNOSIS — E1140 Type 2 diabetes mellitus with diabetic neuropathy, unspecified: Secondary | ICD-10-CM | POA: Diagnosis not present

## 2019-10-20 DIAGNOSIS — E669 Obesity, unspecified: Secondary | ICD-10-CM | POA: Diagnosis not present

## 2019-10-20 DIAGNOSIS — Z79890 Hormone replacement therapy (postmenopausal): Secondary | ICD-10-CM | POA: Insufficient documentation

## 2019-10-20 DIAGNOSIS — Z882 Allergy status to sulfonamides status: Secondary | ICD-10-CM | POA: Insufficient documentation

## 2019-10-20 DIAGNOSIS — Z794 Long term (current) use of insulin: Secondary | ICD-10-CM | POA: Diagnosis not present

## 2019-10-20 DIAGNOSIS — E039 Hypothyroidism, unspecified: Secondary | ICD-10-CM | POA: Insufficient documentation

## 2019-10-20 DIAGNOSIS — Z950 Presence of cardiac pacemaker: Secondary | ICD-10-CM | POA: Insufficient documentation

## 2019-10-20 DIAGNOSIS — E1165 Type 2 diabetes mellitus with hyperglycemia: Secondary | ICD-10-CM | POA: Diagnosis not present

## 2019-10-20 DIAGNOSIS — L97529 Non-pressure chronic ulcer of other part of left foot with unspecified severity: Secondary | ICD-10-CM | POA: Insufficient documentation

## 2019-10-20 DIAGNOSIS — Z87891 Personal history of nicotine dependence: Secondary | ICD-10-CM | POA: Diagnosis not present

## 2019-10-20 DIAGNOSIS — Z79899 Other long term (current) drug therapy: Secondary | ICD-10-CM | POA: Diagnosis not present

## 2019-10-20 DIAGNOSIS — I2510 Atherosclerotic heart disease of native coronary artery without angina pectoris: Secondary | ICD-10-CM | POA: Diagnosis not present

## 2019-10-20 DIAGNOSIS — Z7901 Long term (current) use of anticoagulants: Secondary | ICD-10-CM | POA: Insufficient documentation

## 2019-10-20 DIAGNOSIS — Z955 Presence of coronary angioplasty implant and graft: Secondary | ICD-10-CM | POA: Insufficient documentation

## 2019-10-20 DIAGNOSIS — G4733 Obstructive sleep apnea (adult) (pediatric): Secondary | ICD-10-CM | POA: Insufficient documentation

## 2019-10-20 DIAGNOSIS — I739 Peripheral vascular disease, unspecified: Secondary | ICD-10-CM | POA: Diagnosis not present

## 2019-10-20 DIAGNOSIS — M109 Gout, unspecified: Secondary | ICD-10-CM | POA: Diagnosis not present

## 2019-10-20 DIAGNOSIS — I5042 Chronic combined systolic (congestive) and diastolic (congestive) heart failure: Secondary | ICD-10-CM

## 2019-10-20 LAB — COMPREHENSIVE METABOLIC PANEL
ALT: 25 U/L (ref 0–44)
AST: 33 U/L (ref 15–41)
Albumin: 4.4 g/dL (ref 3.5–5.0)
Alkaline Phosphatase: 67 U/L (ref 38–126)
Anion gap: 11 (ref 5–15)
BUN: 29 mg/dL — ABNORMAL HIGH (ref 8–23)
CO2: 26 mmol/L (ref 22–32)
Calcium: 9.8 mg/dL (ref 8.9–10.3)
Chloride: 101 mmol/L (ref 98–111)
Creatinine, Ser: 1.59 mg/dL — ABNORMAL HIGH (ref 0.44–1.00)
GFR calc Af Amer: 37 mL/min — ABNORMAL LOW (ref >60)
GFR calc non Af Amer: 32 mL/min — ABNORMAL LOW (ref >60)
Glucose, Bld: 229 mg/dL — ABNORMAL HIGH (ref 70–99)
Potassium: 4.5 mmol/L (ref 3.5–5.1)
Sodium: 138 mmol/L (ref 135–145)
Total Bilirubin: 1.3 mg/dL — ABNORMAL HIGH (ref 0.3–1.2)
Total Protein: 7.3 g/dL (ref 6.5–8.1)

## 2019-10-20 LAB — CBC
HCT: 38.4 % (ref 36.0–46.0)
Hemoglobin: 11.6 g/dL — ABNORMAL LOW (ref 12.0–15.0)
MCH: 20.9 pg — ABNORMAL LOW (ref 26.0–34.0)
MCHC: 30.2 g/dL (ref 30.0–36.0)
MCV: 69.1 fL — ABNORMAL LOW (ref 80.0–100.0)
Platelets: 196 10*3/uL (ref 150–400)
RBC: 5.56 MIL/uL — ABNORMAL HIGH (ref 3.87–5.11)
RDW: 16.4 % — ABNORMAL HIGH (ref 11.5–15.5)
WBC: 7.2 10*3/uL (ref 4.0–10.5)
nRBC: 0 % (ref 0.0–0.2)

## 2019-10-20 LAB — LIPID PANEL
Cholesterol: 150 mg/dL (ref 0–200)
HDL: 44 mg/dL (ref >40)
LDL Cholesterol: 65 mg/dL (ref 0–99)
Total CHOL/HDL Ratio: 3.4 RATIO
Triglycerides: 206 mg/dL — ABNORMAL HIGH (ref <150)
VLDL: 41 mg/dL — ABNORMAL HIGH (ref 0–40)

## 2019-10-20 LAB — TSH: TSH: 3.535 u[IU]/mL (ref 0.350–4.500)

## 2019-10-20 LAB — BRAIN NATRIURETIC PEPTIDE: B Natriuretic Peptide: 69.2 pg/mL (ref 0.0–100.0)

## 2019-10-20 LAB — T4, FREE: Free T4: 0.85 ng/dL (ref 0.61–1.12)

## 2019-10-20 MED ORDER — CARVEDILOL 25 MG PO TABS: 25.0000 mg | ORAL_TABLET | Freq: Two times a day (BID) | ORAL | 3 refills | Status: DC

## 2019-10-20 MED ORDER — ATORVASTATIN CALCIUM 80 MG PO TABS: 80.0000 mg | ORAL_TABLET | Freq: Every day | ORAL | 3 refills | Status: DC

## 2019-10-20 MED ORDER — LOSARTAN POTASSIUM 50 MG PO TABS: ORAL_TABLET | ORAL | 3 refills | Status: DC

## 2019-10-20 MED ORDER — POTASSIUM CHLORIDE CRYS ER 10 MEQ PO TBCR: 20.0000 meq | EXTENDED_RELEASE_TABLET | Freq: Every day | ORAL | 3 refills | Status: DC

## 2019-10-20 MED ORDER — ISOSORBIDE MONONITRATE ER 30 MG PO TB24: ORAL_TABLET | ORAL | 3 refills | Status: DC

## 2019-10-20 NOTE — Progress Notes (Signed)
Advanced Heart Failure Clinic Note    Primary Care: Dr. Delfina Redwood Primary Cardiologist: Dr. Haroldine Laws  EP: Dr Tomasa Blase Rheumatology: Dr Posey Rea Sleep Clinic: Dr. Radford Pax  Reason for Visit: F/u for chronic systolic heart failure   HPI:  Danielle [name] is a 74 y.o. female with a past medical history of systolic CHF (EF 16-10%), CAD s/p multiple stents in [place] (with h/o LAD stent thrombosis), ischemic cardiomyopathy (EF 10% at one point), symptomatic bradycardia s/p PPM, HTN, DM, hypothyroidism, and OSA sleep study 03/2017.   Admitted 02/03/17-02/06/17 with SOB, acute on chronic systolic CHF. Diuresed with IV lasix (12 pounds), discharge weight was 215 pounds.   Had R TKA 12/19.   ECHO 01/2017- EF 45-50% Grade 2DD Left Atrium Severely dilated.  ECHO 12/2017 EF 45-50%, G2DD, LA mildly dilated, trileaflet moderately thick aortic valve   She presents now for f/u. Hasn't been seen in over a year. Last clinic visit was 07/2018. She has been followed closely by paramedicine. Just recently returned from [place]. She spent 2 months there w/ family. Her daughter, Danielle [name], works here at [employer].  She has multiple complaints today. Main complaint is numbness and tingling in bilateral hands and feet c/w polyneuropathy. She also notes discoloration of left 1st and 2nd toes. Former smoker. Quit 30 years ago.   She also notes increased fatigue over the last several weeks. No exertional CP or dyspnea. Admits to poor compliance w/ CPAP. Reports device is not working. Compliant w/ all meds including levothyroxine. Denies abnormal bleeding w/ Eliquis. Device interrogation shows 1 episode of afib, lasting ~30 min around the first of March. No recurrence. Also has gained ~15 lb over the last year, but does not appear grossly volume overloaded on exam.     Review of systems complete and found to be negative unless listed in HPI.    Past Medical History:  Diagnosis Date  . Acute combined systolic and diastolic congestive heart  failure ([place]) 02/05/2017  . 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 Acute kidney failure ([place]) 05/15/2018  . Arthritis    "all over" (05/22/2018)  . Blood dyscrasia    per pt-has small blood cells-appears as if anemic  . Breast cancer, left breast ([place]) 1985  . CHF (congestive heart failure) ([place])   . Complication of anesthesia    difficult to awaken from per pt  . Coronary artery disease   . Dyspnea   . Essential hypertension   . Heart disease   . Heart murmur   . History of gout   . Hypothyroidism   . Obesity (BMI 30-39.9) 11/11/2017  . OSA (obstructive sleep apnea) 02/18/2017    severe obstructive sleep apnea with an AHI of 75.6/h and mild central sleep apnea with a CAI of 7.7/h.  Oxygen saturations dropped as low as 82%.   He is on CPAP at 9 cm H2O.  . OSA on CPAP   . Personal history of chemotherapy   . Personal history of radiation therapy   . Presence of permanent cardiac pacemaker   . Primary localized osteoarthritis of right knee 05/13/2018  . Refusal of blood transfusions as patient is Jehovah's Witness   . Type II diabetes mellitus ([place])     Current Outpatient Medications  Medication Sig Dispense Refill  . acetaminophen (TYLENOL) 500 MG tablet Take 500-1,000 mg by mouth every 6 (six) hours as needed (for pain or headaches).    Marland Kitchen allopurinol (ZYLOPRIM) 100 MG tablet Take 1 tablet (100  mg total) by mouth daily.    Marland Kitchen apixaban (ELIQUIS) 5 MG TABS tablet Take 1 tablet (5 mg total) by mouth 2 (two) times daily. 60 tablet 0  . atorvastatin (LIPITOR) 80 MG tablet Take 1 tablet (80 mg total) by mouth at bedtime. 30 tablet 5  . Biotin 10 MG CAPS Take 10 mg by mouth daily.    . Blood Glucose Monitoring Suppl (ACCU-CHEK AVIVA) device by Other route. Use as instructed    . calcium citrate (CALCITRATE - DOSED IN MG ELEMENTAL CALCIUM) 950 MG tablet Take 1 tablet by mouth 2 (two) times daily.     . carvedilol (COREG) 25 MG tablet TAKE 1 TABLET BY MOUTH  TWICE DAILY WITH MEALS 60 tablet 2  . CHONDROITIN SULFATE PO Take 250 mg by mouth daily.    . clotrimazole-betamethasone (LOTRISONE) lotion APPLY LOTION TOPICALLY TWICE DAILY FOR 7 DAYS    . colchicine 0.6 MG tablet TAKE 1 TABLET BY MOUTH EVERY OTHER DAY TO PREVENT FLARES    . gabapentin (NEURONTIN) 300 MG capsule Take 300 mg by mouth daily.     Marland Kitchen glucose blood test strip Use as instructed to test blood sugars 3 times daily E11.65  Freestyle lite 400 each 3  . insulin glargine (LANTUS) 100 UNIT/ML injection Inject 88 Units into the skin daily.    . insulin lispro (HUMALOG KWIKPEN) 200 UNIT/ML KwikPen Inject 28 Units into the skin 3 (three) times daily. 30 mL 6  . Insulin Pen Needle 32G X 4 MM MISC 1 Device by Does not apply route 4 (four) times daily. 400 each 3  . isosorbide mononitrate (IMDUR) 30 MG 24 hr tablet TAKE 1 TABLET BY MOUTH ONCE DAILY -CALL  FOR  OFFICE  VISIT 30 tablet 0  . JARDIANCE 10 MG TABS tablet Take 1 tablet by mouth once daily 30 tablet 0  . levothyroxine (SYNTHROID, LEVOTHROID) 75 MCG tablet Take 75 mcg by mouth daily before breakfast.    . losartan (COZAAR) 50 MG tablet TAKE 1 TABLET BY MOUTH AT BEDTIME 30 tablet 3  . Multiple Vitamins-Minerals (ALIVE ONCE DAILY WOMENS 50+ PO) Take 1 tablet by mouth daily.    . nitroGLYCERIN (NITROSTAT) 0.4 MG SL tablet DISSOLVE ONE TABLET UNDER THE TONGUE EVERY 5 MINUTES AS NEEDED FOR CHEST PAIN. (Patient not taking: Reported on 06/04/2019) 25 tablet 3  . omeprazole (PRILOSEC) 20 MG capsule Take 20 mg by mouth daily.     . potassium chloride (KLOR-CON) 10 MEQ tablet Take 2 tablets (20 mEq total) by mouth daily. 60 tablet 3  . spironolactone (ALDACTONE) 25 MG tablet Take 1 tablet (25 mg total) by mouth every other day. Needs appt for further refills 30 tablet 2  . torsemide (DEMADEX) 20 MG tablet Take 2 tablets (40 mg total) by mouth daily. Please call for office visit [phone] 60 tablet 0  . Travoprost, BAK Free, (TRAVATAN) 0.004 % SOLN  ophthalmic solution Place 1 drop into both eyes at bedtime.      No current facility-administered medications for this encounter.   Allergies  Allergen Reactions  . Other Other (See Comments)    Blood products- Patient won't accept any  . Sulfa Antibiotics Rash  . Uloric [Febuxostat] Rash   Social History   Socioeconomic History  . Marital status: Widowed    Spouse name: Not on file  . Number of children: 3  . Years of education: Not on file  . Highest education level: Some college, no degree  Occupational  History  . Occupation: retired  Tobacco Use  . Smoking status: Former Smoker    Packs/day: 1.00    Years: 22.00    Pack years: 22.00    Types: Cigarettes    Quit date: 1983    Years since quitting: 38.4  . Smokeless tobacco: Never Used  Substance and Sexual Activity  . Alcohol use: Yes    Comment: 05/22/2018 "glass of wine couple times/wk"  . Drug use: Not Currently  . Sexual activity: Not Currently    Birth control/protection: None, Post-menopausal  Other Topics Concern  . Not on file  Social History Narrative  . Not on file   Social Determinants of Health   Financial Resource Strain:   . Difficulty of Paying Living Expenses:   Food Insecurity:   . Worried About Charity fundraiser in the Last Year:   . Arboriculturist in the Last Year:   Transportation Needs:   . Film/video editor (Medical):   Marland Kitchen Lack of Transportation (Non-Medical):   Physical Activity:   . Days of Exercise per Week:   . Minutes of Exercise per Session:   Stress:   . Feeling of Stress :   Social Connections:   . Frequency of Communication with Friends and Family:   . Frequency of Social Gatherings with Friends and Family:   . Attends Religious Services:   . Active Member of Clubs or Organizations:   . Attends Archivist Meetings:   Marland Kitchen Marital Status:   Intimate Partner Violence:   . Fear of Current or Ex-Partner:   . Emotionally Abused:   Marland Kitchen Physically Abused:   .  Sexually Abused:    Family History  Problem Relation Age of Onset  . Multiple myeloma Mother   . Heart disease Father   . Other Sister   . Heart disease Brother   . Diabetes Sister   . Other Sister    Vitals:   10/20/19 0837  BP: 132/74  Pulse: 70  SpO2: 99%  Weight: 97.3 kg (214 lb 9.6 oz)     Wt Readings from Last 3 Encounters:  10/20/19 97.3 kg (214 lb 9.6 oz)  10/19/19 97.3 kg (214 lb 6.4 oz)  09/03/19 95.7 kg (211 lb)    PHYSICAL EXAM: General:  Well appearing, moderately obese. No resp difficulty HEENT: normal Neck: supple. Thick neck but no visibile JVD. Carotids 2+ bilat; no bruits. No lymphadenopathy or thryomegaly appreciated. Cor: PMI nondisplaced. Regular rate & rhythm. No rubs, gallops or murmurs. Lungs: clear Abdomen: soft, nontender, nondistended. No hepatosplenomegaly. No bruits or masses. Good bowel sounds. Extremities: no cyanosis, clubbing, rash, no edema, Lt 1st and 2nd toes w/ discoloration/ early developing sores. No drainage.  Palpable but weak distal pulses.  Neuro: alert & orientedx3, cranial nerves grossly intact. moves all 4 extremities w/o difficulty. Affect pleasant   ASSESSMENT & PLAN:  1. Chronic systolic CHF: ICM, Echo 01/7352 EF 45-50%, G2DD - NYHA II-III symptoms, confounded by arthritis. - does not look grossly volume overloaded on exam but gradual wt gain over the last year  - Will check BNP today to better assess volume status  - Continue torsemide '20mg'$  BID. Will titrate up if BNP elevated - Continue imdur 30 mg daily - Continue losartan 50 mg daily. - Continue 25 mg spironolactone daily.  - Continue Coreg 25 mg twice a day. - Continue jardiance 10 mg daily.  - Continue HF paramedicine.  2. CAD: Stenting in 2010, 2012 and  2016.  - denies chest pain  - Continue statin and  blocker. No ASA with Eliquis.  3. Paroxysmal Afib - Device interrogation shows 1 brief episode of AF ~ first of March, lasting ~30 min. No recurrence -  Continue Eliquis 5 mg BID. No bleeding. Check CBC  - Following in Afib clinic.   4. HTN - Well controlled on current regimen  5. DM - poorly controlled. Hgb A1c 8.8.  - Per PCP - Continue Jardiance.   6. H/o sinus pauses s/p PPM - Dr. Curt Bears now following. No change.   7. Gout - Per PCP/ Rheumatology   8. Arthritis - Continue tylenol. Per PCP - followed by Rheum  9. OSA - admits to poor compliance w/ CPAP.  - may be contributing to fatigue - advised to f/u w/ Dr Radford Pax for new device   10. Hypothyroidism - on levothyroxine - managed by PCP  - given fatigue and progressive wt gain, will check TSH, Free T3/T4 today   11. Polyneuropathy - suspect likely 2/2 poorly controlled DM - however given concomitant HF w/ diastolic dysfunction, conduction disease (Afib, bradycardia requiring PPM) Aortic valve thickening, will need imaging to r/o amyloid. Study of choice pending BMP. Plan cMRI if SCr <1.5. PYP scan if >1.5.   - she has evidence of early developing LE wounds on foot and signs of poor perfusion (weak distal pulses and discoloration of 1st and 2nd toes of left foot. Has multiple risk factors for PAD. Will order bilateral LE arterial dopplers w/ ABIs. Refer to Dr. Gwenlyn Found if abnormal.    F/u in 4 weeks.   Lyda Jester, PA-C 10/20/19

## 2019-10-20 NOTE — Patient Instructions (Addendum)
Refills have been sent to your pharmacy for your heart medications.   Labs today We will only contact you if something comes back abnormal or we need to make some changes. Otherwise no news is good news!   Your physician has requested that you have an ankle brachial index (ABI). During this test an ultrasound and blood pressure cuff are used to evaluate the arteries that supply the arms and legs with blood. Allow thirty minutes for this exam. There are no restrictions or special instructions.   Your physician recommends that you schedule a follow-up appointment in: 4-6 weeks with the Nurse Practitioner/Physician Assistant   Please call office at [phone] option 2 if you have any questions or concerns.   At the [facility], you and your health needs are our priority. As part of our continuing mission to provide you with exceptional heart care, we have created designated Provider Care Teams. These Care Teams include your primary Cardiologist (physician) and Advanced Practice Providers (APPs- Physician Assistants and Nurse Practitioners) who all work together to provide you with the care you need, when you need it.   You may see any of the following providers on your designated Care Team at your next follow up: Marland Kitchen Dr Glori Bickers . Dr Loralie Champagne . Darrick Grinder, NP . Lyda Jester, PA . Audry Riles, PharmD   Please be sure to bring in all your medications bottles to every appointment.

## 2019-10-20 NOTE — Progress Notes (Signed)
Paramedicine Encounter    Patient ID: Danielle [name], female    DOB: 1945-12-02, 74 y.o.   MRN: [id number]   Patient Care Team: Seward Carol, MD as PCP - General (Internal Medicine) Constance Haw, MD as PCP - Electrophysiology (Cardiology) Jorge Ny, LCSW as Social Worker (Licensed Clinical Social Worker)  Patient Active Problem List   Diagnosis Date Noted  . Type 2 diabetes mellitus with hyperglycemia, with long-term current use of insulin ([place]) 05/14/2019  . Diabetes mellitus ([place]) 05/14/2019  . 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, with long-term current use of insulin ([place]) 05/14/2019  . Type 2 diabetes mellitus with diabetic polyneuropathy, with long-term current use of insulin ([place]) 05/14/2019  . Encephalopathy acute 05/20/2018  . 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 Acute kidney failure ([place]) 05/15/2018  . Primary localized osteoarthritis of right knee 05/13/2018  . (HFpEF) heart failure with preserved ejection fraction ([place]) 12/20/2017  . Obesity (BMI 30-39.9) 11/11/2017  . OSA (obstructive sleep apnea) 02/18/2017  . Essential hypertension   . Diabetes mellitus without complication ([place])   . Cancer ([place])   . Thyroid disease     Current Outpatient Medications:  .  acetaminophen (TYLENOL) 500 MG tablet, Take 500-1,000 mg by mouth every 6 (six) hours as needed (for pain or headaches)., Disp: , Rfl:  .  allopurinol (ZYLOPRIM) 100 MG tablet, Take 1 tablet (100 mg total) by mouth daily., Disp: , Rfl:  .  apixaban (ELIQUIS) 5 MG TABS tablet, Take 1 tablet (5 mg total) by mouth 2 (two) times daily., Disp: 60 tablet, Rfl: 0 .  atorvastatin (LIPITOR) 80 MG tablet, Take 1 tablet (80 mg total) by mouth at bedtime., Disp: 90 tablet, Rfl: 3 .  Biotin 10 MG CAPS, Take 10 mg by mouth daily., Disp: , Rfl:  .  Blood Glucose Monitoring Suppl (ACCU-CHEK AVIVA) device, by Other route. Use as instructed, Disp: , Rfl:  .  calcium  citrate (CALCITRATE - DOSED IN MG ELEMENTAL CALCIUM) 950 MG tablet, Take 1 tablet by mouth 2 (two) times daily. , Disp: , Rfl:  .  carvedilol (COREG) 25 MG tablet, Take 1 tablet (25 mg total) by mouth 2 (two) times daily with a meal., Disp: 180 tablet, Rfl: 3 .  CHONDROITIN SULFATE PO, Take 250 mg by mouth daily., Disp: , Rfl:  .  clotrimazole-betamethasone (LOTRISONE) lotion, APPLY LOTION TOPICALLY TWICE DAILY FOR 7 DAYS, Disp: , Rfl:  .  colchicine 0.6 MG tablet, TAKE 1 TABLET BY MOUTH EVERY OTHER DAY TO PREVENT FLARES, Disp: , Rfl:  .  ferrous sulfate 325 (65 FE) MG tablet, Take 325 mg by mouth daily with breakfast., Disp: , Rfl:  .  gabapentin (NEURONTIN) 300 MG capsule, Take 300 mg by mouth daily. , Disp: , Rfl:  .  glucose blood test strip, Use as instructed to test blood sugars 3 times daily E11.65  Freestyle lite, Disp: 400 each, Rfl: 3 .  insulin glargine (LANTUS) 100 UNIT/ML injection, Inject 88 Units into the skin daily., Disp: , Rfl:  .  insulin lispro (HUMALOG KWIKPEN) 200 UNIT/ML KwikPen, Inject 28 Units into the skin 3 (three) times daily., Disp: 30 mL, Rfl: 6 .  Insulin Pen Needle 32G X 4 MM MISC, 1 Device by Does not apply route 4 (four) times daily., Disp: 400 each, Rfl: 3 .  isosorbide mononitrate (IMDUR) 30 MG 24 hr tablet, TAKE 1 TABLET BY MOUTH ONCE DAILY, Disp: 90 tablet, Rfl: 3 .  JARDIANCE 10 MG TABS tablet, Take 1 tablet by mouth once daily, Disp: 30 tablet, Rfl: 0 .  levothyroxine (SYNTHROID, LEVOTHROID) 75 MCG tablet, Take 75 mcg by mouth daily before breakfast., Disp: , Rfl:  .  losartan (COZAAR) 50 MG tablet, TAKE 1 TABLET BY MOUTH AT BEDTIME, Disp: 90 tablet, Rfl: 3 .  Multiple Vitamins-Minerals (ALIVE ONCE DAILY WOMENS 50+ PO), Take 1 tablet by mouth daily., Disp: , Rfl:  .  nitroGLYCERIN (NITROSTAT) 0.4 MG SL tablet, DISSOLVE ONE TABLET UNDER THE TONGUE EVERY 5 MINUTES AS NEEDED FOR CHEST PAIN., Disp: 25 tablet, Rfl: 3 .  omeprazole (PRILOSEC) 20 MG capsule, Take 20  mg by mouth daily. , Disp: , Rfl:  .  potassium chloride (KLOR-CON) 10 MEQ tablet, Take 2 tablets (20 mEq total) by mouth daily., Disp: 180 tablet, Rfl: 3 .  spironolactone (ALDACTONE) 25 MG tablet, Take 1 tablet (25 mg total) by mouth every other day. Needs appt for further refills, Disp: 30 tablet, Rfl: 2 .  torsemide (DEMADEX) 20 MG tablet, Take 2 tablets (40 mg total) by mouth daily. Please call for office visit [phone], Disp: 60 tablet, Rfl: 0 .  Travoprost, BAK Free, (TRAVATAN) 0.004 % SOLN ophthalmic solution, Place 1 drop into both eyes at bedtime. , Disp: , Rfl:  Allergies  Allergen Reactions  . Other Other (See Comments)    Blood products- Patient won't accept any  . Sulfa Antibiotics Rash  . Uloric [Febuxostat] Rash     Social History   Socioeconomic History  . Marital status: Widowed    Spouse name: Not on file  . Number of children: 3  . Years of education: Not on file  . Highest education level: Some college, no degree  Occupational History  . Occupation: retired  Tobacco Use  . Smoking status: Former Smoker    Packs/day: 1.00    Years: 22.00    Pack years: 22.00    Types: Cigarettes    Quit date: 1983    Years since quitting: 38.4  . Smokeless tobacco: Never Used  Substance and Sexual Activity  . Alcohol use: Yes    Comment: 05/22/2018 "glass of wine couple times/wk"  . Drug use: Not Currently  . Sexual activity: Not Currently    Birth control/protection: None, Post-menopausal  Other Topics Concern  . Not on file  Social History Narrative  . Not on file   Social Determinants of Health   Financial Resource Strain:   . Difficulty of Paying Living Expenses:   Food Insecurity:   . Worried About Charity fundraiser in the Last Year:   . Arboriculturist in the Last Year:   Transportation Needs:   . Film/video editor (Medical):   Marland Kitchen Lack of Transportation (Non-Medical):   Physical Activity:   . Days of Exercise per Week:   . Minutes of Exercise  per Session:   Stress:   . Feeling of Stress :   Social Connections:   . Frequency of Communication with Friends and Family:   . Frequency of Social Gatherings with Friends and Family:   . Attends Religious Services:   . Active Member of Clubs or Organizations:   . Attends Archivist Meetings:   Marland Kitchen Marital Status:   Intimate Partner Violence:   . Fear of Current or Ex-Partner:   . Emotionally Abused:   Marland Kitchen Physically Abused:   . Sexually Abused:     Physical Exam Vitals reviewed.  Constitutional:  Appearance: She is normal weight.  HENT:     Head: Normocephalic.     Nose: Nose normal.     Mouth/Throat:     Mouth: Mucous membranes are moist.  Eyes:     Pupils: Pupils are equal, round, and reactive to light.  Cardiovascular:     Rate and Rhythm: Normal rate and regular rhythm.     Pulses: Normal pulses.  Pulmonary:     Effort: Pulmonary effort is normal.     Breath sounds: Normal breath sounds.  Abdominal:     General: Abdomen is flat.     Palpations: Abdomen is soft.  Musculoskeletal:        General: Swelling and tenderness present. Normal range of motion.     Cervical back: Normal range of motion.     Right lower leg: Edema present.     Left lower leg: Edema present.  Skin:    General: Skin is warm.     Capillary Refill: Capillary refill takes less than 2 seconds.  Neurological:     Mental Status: She is alert. Mental status is at baseline.  Psychiatric:        Mood and Affect: Mood normal.     Arrived for clinic visit for Nija who was alert and oriented seated in a chair with complaints of bilateral hand and feet pain. Patient stated she has had pain in her hands and feet for a few days and a few of her toes on her left foot are blue and swollen. Ellen Henri examined same and noted her 2nd and 3rd toe being swollen and cyanotic. Left foot had strong PMS. Viktoria can not recall if she injured those toes or if they just became swollen. Lee  denied increased shortness of breath, dizziness or chest pain. Pl[place] was instructed to follow up with Dr. Gretchen Short office for review of CPAP equipment. I scheduled same for June 29th at [place]. [place] PA reports she will have labs taken today for Mrs. Quevedo as well as scheduling a cardiac ultrasound. I will see patient next week in the home.    Refills: -Allupurinol (awaiting MD approval of refill) -Potassium (too soon 5/28) -Colchicine (awaiting MD approval of refill) -Jardiance (awaiting MD approval of refill) -Isosorbide (too soon 5/28)    Future Appointments  Date Time Provider [place]  11/03/2019  3:45 PM Constance Haw, MD [facility]  11/09/2019  3:00 PM [facility] VASC 1 [facility]  11/26/2019 10:00 AM [facility] PA/NP [facility] None  12/01/2019  7:20 AM [facility] DEVICE REMOTES [facility]  03/01/2020  7:20 AM [facility] DEVICE REMOTES [facility]  04/15/2020 10:10 AM Shamleffer, Melanie Crazier, MD [facility] None     ACTION: Home visit completed Next visit planned for one week

## 2019-10-21 LAB — T3, FREE: T3, Free: 1.8 pg/mL — ABNORMAL LOW (ref 2.0–4.4)

## 2019-10-27 ENCOUNTER — Other Ambulatory Visit (HOSPITAL_COMMUNITY): Payer: Self-pay | Admitting: Cardiology

## 2019-10-27 ENCOUNTER — Other Ambulatory Visit (HOSPITAL_COMMUNITY): Payer: Self-pay

## 2019-10-27 DIAGNOSIS — I739 Peripheral vascular disease, unspecified: Secondary | ICD-10-CM

## 2019-10-27 NOTE — Progress Notes (Signed)
Paramedicine Encounter    Patient ID: Danielle [name], female    DOB: 1945-09-03, 74 y.o.   MRN: [id number]   Patient Care Team: Seward Carol, MD as PCP - General (Internal Medicine) Constance Haw, MD as PCP - Electrophysiology (Cardiology) Jorge Ny, LCSW as Social Worker (Licensed Clinical Social Worker)  Patient Active Problem List   Diagnosis Date Noted  . Type 2 diabetes mellitus with hyperglycemia, with long-term current use of insulin ([place]) 05/14/2019  . Diabetes mellitus ([place]) 05/14/2019  . 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, with long-term current use of insulin ([place]) 05/14/2019  . Type 2 diabetes mellitus with diabetic polyneuropathy, with long-term current use of insulin ([place]) 05/14/2019  . Encephalopathy acute 05/20/2018  . 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 Acute kidney failure ([place]) 05/15/2018  . Primary localized osteoarthritis of right knee 05/13/2018  . (HFpEF) heart failure with preserved ejection fraction ([place]) 12/20/2017  . Obesity (BMI 30-39.9) 11/11/2017  . OSA (obstructive sleep apnea) 02/18/2017  . Essential hypertension   . Diabetes mellitus without complication ([place])   . Cancer ([place])   . Thyroid disease     Current Outpatient Medications:  .  apixaban (ELIQUIS) 5 MG TABS tablet, Take 1 tablet (5 mg total) by mouth 2 (two) times daily., Disp: 60 tablet, Rfl: 0 .  atorvastatin (LIPITOR) 80 MG tablet, Take 1 tablet (80 mg total) by mouth at bedtime., Disp: 90 tablet, Rfl: 3 .  Biotin 10 MG CAPS, Take 10 mg by mouth daily., Disp: , Rfl:  .  Blood Glucose Monitoring Suppl (ACCU-CHEK AVIVA) device, by Other route. Use as instructed, Disp: , Rfl:  .  calcium citrate (CALCITRATE - DOSED IN MG ELEMENTAL CALCIUM) 950 MG tablet, Take 1 tablet by mouth 2 (two) times daily. , Disp: , Rfl:  .  carvedilol (COREG) 25 MG tablet, Take 1 tablet (25 mg total) by mouth 2 (two) times daily with a meal., Disp:  180 tablet, Rfl: 3 .  colchicine 0.6 MG tablet, TAKE 1 TABLET BY MOUTH EVERY OTHER DAY TO PREVENT FLARES, Disp: , Rfl:  .  ferrous sulfate 325 (65 FE) MG tablet, Take 325 mg by mouth daily with breakfast., Disp: , Rfl:  .  gabapentin (NEURONTIN) 300 MG capsule, Take 300 mg by mouth daily. , Disp: , Rfl:  .  glucose blood test strip, Use as instructed to test blood sugars 3 times daily E11.65  Freestyle lite, Disp: 400 each, Rfl: 3 .  insulin glargine (LANTUS) 100 UNIT/ML injection, Inject 88 Units into the skin daily., Disp: , Rfl:  .  insulin lispro (HUMALOG KWIKPEN) 200 UNIT/ML KwikPen, Inject 28 Units into the skin 3 (three) times daily., Disp: 30 mL, Rfl: 6 .  Insulin Pen Needle 32G X 4 MM MISC, 1 Device by Does not apply route 4 (four) times daily., Disp: 400 each, Rfl: 3 .  isosorbide mononitrate (IMDUR) 30 MG 24 hr tablet, TAKE 1 TABLET BY MOUTH ONCE DAILY, Disp: 90 tablet, Rfl: 3 .  JARDIANCE 10 MG TABS tablet, Take 1 tablet by mouth once daily, Disp: 90 tablet, Rfl: 3 .  levothyroxine (SYNTHROID, LEVOTHROID) 75 MCG tablet, Take 75 mcg by mouth daily before breakfast., Disp: , Rfl:  .  losartan (COZAAR) 50 MG tablet, TAKE 1 TABLET BY MOUTH AT BEDTIME, Disp: 90 tablet, Rfl: 3 .  Multiple Vitamins-Minerals (ALIVE ONCE DAILY WOMENS 50+ PO), Take 1 tablet by mouth daily., Disp: , Rfl:  .  omeprazole (PRILOSEC) 20 MG capsule, Take 20 mg by mouth daily. , Disp: , Rfl:  .  potassium chloride (KLOR-CON) 10 MEQ tablet, Take 2 tablets (20 mEq total) by mouth daily., Disp: 180 tablet, Rfl: 3 .  spironolactone (ALDACTONE) 25 MG tablet, Take 1 tablet (25 mg total) by mouth every other day. Needs appt for further refills, Disp: 30 tablet, Rfl: 2 .  torsemide (DEMADEX) 20 MG tablet, Take 2 tablets (40 mg total) by mouth daily. Please call for office visit [phone], Disp: 60 tablet, Rfl: 0 .  Travoprost, BAK Free, (TRAVATAN) 0.004 % SOLN ophthalmic solution, Place 1 drop into both eyes at bedtime. ,  Disp: , Rfl:  .  acetaminophen (TYLENOL) 500 MG tablet, Take 500-1,000 mg by mouth every 6 (six) hours as needed (for pain or headaches)., Disp: , Rfl:  .  allopurinol (ZYLOPRIM) 100 MG tablet, Take 1 tablet (100 mg total) by mouth daily., Disp: , Rfl:  .  CHONDROITIN SULFATE PO, Take 250 mg by mouth daily., Disp: , Rfl:  .  clotrimazole-betamethasone (LOTRISONE) lotion, APPLY LOTION TOPICALLY TWICE DAILY FOR 7 DAYS, Disp: , Rfl:  .  nitroGLYCERIN (NITROSTAT) 0.4 MG SL tablet, DISSOLVE ONE TABLET UNDER THE TONGUE EVERY 5 MINUTES AS NEEDED FOR CHEST PAIN. (Patient not taking: Reported on 10/27/2019), Disp: 25 tablet, Rfl: 3 Allergies  Allergen Reactions  . Other Other (See Comments)    Blood products- Patient won't accept any  . Sulfa Antibiotics Rash  . Uloric [Febuxostat] Rash     Social History   Socioeconomic History  . Marital status: Widowed    Spouse name: Not on file  . Number of children: 3  . Years of education: Not on file  . Highest education level: Some college, no degree  Occupational History  . Occupation: retired  Tobacco Use  . Smoking status: Former Smoker    Packs/day: 1.00    Years: 22.00    Pack years: 22.00    Types: Cigarettes    Quit date: 1983    Years since quitting: 38.4  . Smokeless tobacco: Never Used  Substance and Sexual Activity  . Alcohol use: Yes    Comment: 05/22/2018 "glass of wine couple times/wk"  . Drug use: Not Currently  . Sexual activity: Not Currently    Birth control/protection: None, Post-menopausal  Other Topics Concern  . Not on file  Social History Narrative  . Not on file   Social Determinants of Health   Financial Resource Strain:   . Difficulty of Paying Living Expenses:   Food Insecurity:   . Worried About Charity fundraiser in the Last Year:   . Arboriculturist in the Last Year:   Transportation Needs:   . Film/video editor (Medical):   Marland Kitchen Lack of Transportation (Non-Medical):   Physical Activity:   . Days  of Exercise per Week:   . Minutes of Exercise per Session:   Stress:   . Feeling of Stress :   Social Connections:   . Frequency of Communication with Friends and Family:   . Frequency of Social Gatherings with Friends and Family:   . Attends Religious Services:   . Active Member of Clubs or Organizations:   . Attends Archivist Meetings:   Marland Kitchen Marital Status:   Intimate Partner Violence:   . Fear of Current or Ex-Partner:   . Emotionally Abused:   Marland Kitchen Physically Abused:   . Sexually Abused:     Physical Exam  Vitals reviewed.  Constitutional:      Appearance: She is normal weight.  HENT:     Head: Normocephalic.     Nose: Nose normal.     Mouth/Throat:     Mouth: Mucous membranes are moist.  Eyes:     Pupils: Pupils are equal, round, and reactive to light.  Cardiovascular:     Rate and Rhythm: Normal rate and regular rhythm.     Pulses: Normal pulses.     Heart sounds: Normal heart sounds.  Pulmonary:     Effort: Pulmonary effort is normal.     Breath sounds: Normal breath sounds.  Abdominal:     General: Abdomen is flat.     Palpations: Abdomen is soft.  Musculoskeletal:        General: Swelling present. Normal range of motion.     Cervical back: Normal range of motion.     Right lower leg: Edema present.     Left lower leg: Edema present.  Skin:    General: Skin is warm and dry.     Capillary Refill: Capillary refill takes less than 2 seconds.  Neurological:     General: No focal deficit present.     Mental Status: She is alert. Mental status is at baseline.  Psychiatric:        Mood and Affect: Mood normal.      Arrived for home visit for Woodson who was alert and oriented in good spirits seated at her kitchen table. Rosebud denied any complaints stating she felt good and that she went to see the eye doctor today with a good report. Vitals obtained and are as noted. Medications reviewed and confirmed. Pill box filled accordingly. Minn[place] and I reviewed  her upcoming appointments. Home visit complete.   Refills: Allupurinol Colchicine    CBG- [id number]      Future Appointments  Date Time Provider [place]  11/03/2019  3:45 PM Constance Haw, MD [facility]  11/09/2019  3:00 PM [facility] VASC 1 [facility]  11/24/2019  9:45 AM Imogene Burn, PA-C [facility]  11/26/2019 10:00 AM [facility] PA/NP [facility] None  12/01/2019  7:20 AM [facility] DEVICE REMOTES [facility]  03/01/2020  7:20 AM [facility] DEVICE REMOTES [facility]  04/15/2020 10:10 AM Shamleffer, Melanie Crazier, MD [facility] None     ACTION: Home visit completed Next visit planned for one week

## 2019-11-03 ENCOUNTER — Ambulatory Visit: Payer: Medicare HMO | Admitting: Cardiology

## 2019-11-03 ENCOUNTER — Encounter: Payer: Self-pay | Admitting: Cardiology

## 2019-11-03 ENCOUNTER — Other Ambulatory Visit: Payer: Self-pay

## 2019-11-03 ENCOUNTER — Other Ambulatory Visit (HOSPITAL_COMMUNITY): Payer: Self-pay | Admitting: *Deleted

## 2019-11-03 ENCOUNTER — Other Ambulatory Visit (HOSPITAL_COMMUNITY): Payer: Self-pay

## 2019-11-03 VITALS — BP 140/74 | HR 67 | Ht 62.0 in | Wt 227.0 lb

## 2019-11-03 DIAGNOSIS — I495 Sick sinus syndrome: Secondary | ICD-10-CM

## 2019-11-03 LAB — CUP PACEART INCLINIC DEVICE CHECK
Battery Remaining Longevity: 72 mo
Battery Voltage: 3 V
Brady Statistic AP VP Percent: 0.01 %
Brady Statistic AP VS Percent: 2.6 %
Brady Statistic AS VP Percent: 0.07 %
Brady Statistic AS VS Percent: 97.32 %
Brady Statistic RA Percent Paced: 2.6 %
Brady Statistic RV Percent Paced: 0.08 %
Date Time Interrogation Session: 20210608161500
Implantable Lead Implant Date: 20160222
Implantable Lead Implant Date: 20160222
Implantable Lead Location: 753859
Implantable Lead Location: 753860
Implantable Lead Model: 5076
Implantable Lead Model: 5076
Implantable Pulse Generator Implant Date: 20160222
Lead Channel Impedance Value: 399 Ohm
Lead Channel Impedance Value: 418 Ohm
Lead Channel Impedance Value: 456 Ohm
Lead Channel Impedance Value: 513 Ohm
Lead Channel Pacing Threshold Amplitude: 0.75 V
Lead Channel Pacing Threshold Amplitude: 0.75 V
Lead Channel Pacing Threshold Pulse Width: 0.4 ms
Lead Channel Pacing Threshold Pulse Width: 0.4 ms
Lead Channel Sensing Intrinsic Amplitude: 20.75 mV
Lead Channel Sensing Intrinsic Amplitude: 5.4 mV
Lead Channel Setting Pacing Amplitude: 2 V
Lead Channel Setting Pacing Amplitude: 2 V
Lead Channel Setting Pacing Pulse Width: 0.4 ms
Lead Channel Setting Sensing Sensitivity: 2 mV

## 2019-11-03 MED ORDER — TORSEMIDE 20 MG PO TABS: 40.0000 mg | ORAL_TABLET | Freq: Every day | ORAL | 3 refills | Status: DC

## 2019-11-03 MED ORDER — APIXABAN 5 MG PO TABS: 5.0000 mg | ORAL_TABLET | Freq: Two times a day (BID) | ORAL | 3 refills | Status: DC

## 2019-11-03 NOTE — Progress Notes (Signed)
Paramedicine Encounter    Patient ID: Danielle [name], female    DOB: October 28, 1945, 74 y.o.   MRN: [id number]   Patient Care Team: Seward Carol, MD as PCP - General (Internal Medicine) Constance Haw, MD as PCP - Electrophysiology (Cardiology) Jorge Ny, LCSW as Social Worker (Licensed Clinical Social Worker)  Patient Active Problem List   Diagnosis Date Noted  . Type 2 diabetes mellitus with hyperglycemia, with long-term current use of insulin ([place]) 05/14/2019  . Diabetes mellitus ([place]) 05/14/2019  . 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, with long-term current use of insulin ([place]) 05/14/2019  . Type 2 diabetes mellitus with diabetic polyneuropathy, with long-term current use of insulin ([place]) 05/14/2019  . Encephalopathy acute 05/20/2018  . 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 Acute kidney failure ([place]) 05/15/2018  . Primary localized osteoarthritis of right knee 05/13/2018  . (HFpEF) heart failure with preserved ejection fraction ([place]) 12/20/2017  . Obesity (BMI 30-39.9) 11/11/2017  . OSA (obstructive sleep apnea) 02/18/2017  . Essential hypertension   . Diabetes mellitus without complication ([place])   . Cancer ([place])   . Thyroid disease     Current Outpatient Medications:  .  acetaminophen (TYLENOL) 500 MG tablet, Take 500-1,000 mg by mouth every 6 (six) hours as needed (for pain or headaches)., Disp: , Rfl:  .  atorvastatin (LIPITOR) 80 MG tablet, Take 1 tablet (80 mg total) by mouth at bedtime., Disp: 90 tablet, Rfl: 3 .  Biotin 10 MG CAPS, Take 10 mg by mouth daily., Disp: , Rfl:  .  Blood Glucose Monitoring Suppl (ACCU-CHEK AVIVA) device, by Other route. Use as instructed, Disp: , Rfl:  .  carvedilol (COREG) 25 MG tablet, Take 1 tablet (25 mg total) by mouth 2 (two) times daily with a meal., Disp: 180 tablet, Rfl: 3 .  ferrous sulfate 325 (65 FE) MG tablet, Take 325 mg by mouth daily with breakfast., Disp: , Rfl:  .   gabapentin (NEURONTIN) 300 MG capsule, Take 300 mg by mouth daily. , Disp: , Rfl:  .  glucose blood test strip, Use as instructed to test blood sugars 3 times daily E11.65  Freestyle lite, Disp: 400 each, Rfl: 3 .  insulin glargine (LANTUS) 100 UNIT/ML injection, Inject 88 Units into the skin daily., Disp: , Rfl:  .  insulin lispro (HUMALOG KWIKPEN) 200 UNIT/ML KwikPen, Inject 28 Units into the skin 3 (three) times daily., Disp: 30 mL, Rfl: 6 .  Insulin Pen Needle 32G X 4 MM MISC, 1 Device by Does not apply route 4 (four) times daily., Disp: 400 each, Rfl: 3 .  isosorbide mononitrate (IMDUR) 30 MG 24 hr tablet, TAKE 1 TABLET BY MOUTH ONCE DAILY, Disp: 90 tablet, Rfl: 3 .  JARDIANCE 10 MG TABS tablet, Take 1 tablet by mouth once daily, Disp: 90 tablet, Rfl: 3 .  levothyroxine (SYNTHROID, LEVOTHROID) 75 MCG tablet, Take 75 mcg by mouth daily before breakfast., Disp: , Rfl:  .  losartan (COZAAR) 50 MG tablet, TAKE 1 TABLET BY MOUTH AT BEDTIME, Disp: 90 tablet, Rfl: 3 .  Multiple Vitamins-Minerals (ALIVE ONCE DAILY WOMENS 50+ PO), Take 1 tablet by mouth daily., Disp: , Rfl:  .  omeprazole (PRILOSEC) 20 MG capsule, Take 20 mg by mouth daily. , Disp: , Rfl:  .  potassium chloride (KLOR-CON) 10 MEQ tablet, Take 2 tablets (20 mEq total) by mouth daily., Disp: 180 tablet, Rfl: 3 .  spironolactone (ALDACTONE) 25 MG tablet, Take  1 tablet (25 mg total) by mouth every other day. Needs appt for further refills, Disp: 30 tablet, Rfl: 2 .  torsemide (DEMADEX) 20 MG tablet, Take 2 tablets (40 mg total) by mouth daily., Disp: 60 tablet, Rfl: 3 .  Travoprost, BAK Free, (TRAVATAN) 0.004 % SOLN ophthalmic solution, Place 1 drop into both eyes at bedtime. , Disp: , Rfl:  .  allopurinol (ZYLOPRIM) 100 MG tablet, Take 1 tablet (100 mg total) by mouth daily., Disp: , Rfl:  .  apixaban (ELIQUIS) 5 MG TABS tablet, Take 1 tablet (5 mg total) by mouth 2 (two) times daily., Disp: 60 tablet, Rfl: 3 .  calcium citrate (CALCITRATE  - DOSED IN MG ELEMENTAL CALCIUM) 950 MG tablet, Take 1 tablet by mouth 2 (two) times daily. , Disp: , Rfl:  .  CHONDROITIN SULFATE PO, Take 250 mg by mouth daily., Disp: , Rfl:  .  colchicine 0.6 MG tablet, TAKE 1 TABLET BY MOUTH EVERY OTHER DAY TO PREVENT FLARES, Disp: , Rfl:  .  nitroGLYCERIN (NITROSTAT) 0.4 MG SL tablet, DISSOLVE ONE TABLET UNDER THE TONGUE EVERY 5 MINUTES AS NEEDED FOR CHEST PAIN., Disp: 25 tablet, Rfl: 3 .  Omega-3 Fatty Acids (FISH OIL PO), Take 1 capsule by mouth daily., Disp: , Rfl:  Allergies  Allergen Reactions  . Other Other (See Comments)    Blood products- Patient won't accept any  . Sulfa Antibiotics Rash  . Uloric [Febuxostat] Rash     Social History   Socioeconomic History  . Marital status: Widowed    Spouse name: Not on file  . Number of children: 3  . Years of education: Not on file  . Highest education level: Some college, no degree  Occupational History  . Occupation: retired  Tobacco Use  . Smoking status: Former Smoker    Packs/day: 1.00    Years: 22.00    Pack years: 22.00    Types: Cigarettes    Quit date: 1983    Years since quitting: 38.4  . Smokeless tobacco: Never Used  Substance and Sexual Activity  . Alcohol use: Yes    Comment: 05/22/2018 "glass of wine couple times/wk"  . Drug use: Not Currently  . Sexual activity: Not Currently    Birth control/protection: None, Post-menopausal  Other Topics Concern  . Not on file  Social History Narrative  . Not on file   Social Determinants of Health   Financial Resource Strain:   . Difficulty of Paying Living Expenses:   Food Insecurity:   . Worried About Charity fundraiser in the Last Year:   . Arboriculturist in the Last Year:   Transportation Needs:   . Film/video editor (Medical):   Marland Kitchen Lack of Transportation (Non-Medical):   Physical Activity:   . Days of Exercise per Week:   . Minutes of Exercise per Session:   Stress:   . Feeling of Stress :   Social  Connections:   . Frequency of Communication with Friends and Family:   . Frequency of Social Gatherings with Friends and Family:   . Attends Religious Services:   . Active Member of Clubs or Organizations:   . Attends Archivist Meetings:   Marland Kitchen Marital Status:   Intimate Partner Violence:   . Fear of Current or Ex-Partner:   . Emotionally Abused:   Marland Kitchen Physically Abused:   . Sexually Abused:     Physical Exam   Arrived for home visit for [facility] who  was alert and oriented seated in her kitchen reporting she feels "okay". Danielle [name] is noting to still be having some discoloration and swelling to left 2nd toe. Patient will be receiving scan in the coming weeks for same. Danielle [name] is seeing one of her cardiologists today as well. Vitals obtained. Medications reviewed and confirmed. Pill box filled. Danielle [name] reports having one or two bowel movements every three days despite using metamucil or drinking coffee or eating greens. I encouraged her to mention this to her cardiologist and if he approves we will proceed with GI follow up. Danielle [name] reminded of upcoming appointments. Home visit complete.   Refills: Eliquis    Future Appointments  Date Time Provider [place]  11/05/2019 10:00 AM Rankin, Clent Demark, MD RDE-RDE None  11/09/2019  3:00 PM MC-CV NL VASC 1 MC-SECVI [facility]  11/24/2019  9:45 AM Imogene Burn, PA-C [facility]  11/26/2019 10:00 AM MC-HVSC PA/NP MC-HVSC None  12/01/2019  7:20 AM [facility] DEVICE REMOTES [facility]  03/01/2020  7:20 AM [facility] DEVICE REMOTES [facility]  04/15/2020 10:10 AM Shamleffer, Melanie Crazier, MD [facility]-LBENDO None     ACTION: Home visit completed Next visit planned for one week

## 2019-11-03 NOTE — Progress Notes (Signed)
Electrophysiology Office Note   Date:  11/03/2019   ID:  Danielle [name], DOB 29-Oct-1945, MRN [id number]  PCP:  Seward Carol, MD  Cardiologist:  [place] Primary Electrophysiologist:  Renesme Kerrigan Meredith Leeds, MD    No chief complaint on file.    History of Present Illness: Danielle [name] is a 74 y.o. female who is being seen today for the evaluation of CHF at the request of Seward Carol, MD. Presenting today for electrophysiology evaluation. Has a history of systolic heart failure with an EF 45-50%, coronary disease, nonischemic cardiomyopathy, hypertension, diabetes, hypothyroidism, and sleep apnea. She had PCI to the LAD in 2007. She was planned for elective right knee replacement February 2016. After induction of anesthesia, the patient became hypotensive and bradycardic. EKG showed ST elevation in the anterior wall. She had an emergent cath and found her LAD stent thrombosed. Her ejection fraction time was 10% which improved to 45-50%. She had greater than 3 second pauses and had a Medtronic dual-chamber pacemaker implanted 07/19/14.    Today, denies symptoms of palpitations, chest pain, shortness of breath, orthopnea, PND, lower extremity edema, claudication, dizziness, presyncope, syncope, bleeding, or neurologic sequela. The patient is tolerating medications without difficulties.  She is feels well.  She was noted to have discoloration of toes on her left foot that has ABIs planned.  She also has been having GI issues with constipation.  She has been having no chest pain or shortness of breath.  She is able to do all of her daily activities.  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[place]) 02/05/2017   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 05/20/2018   Acute kidney failure ([place]) 05/15/2018   Arthritis    "all over" (05/22/2018)   Blood dyscrasia    per pt-has small blood cells-appears as if anemic    Breast cancer, left breast ([place]) 1985   CHF (congestive heart failure) ([facility])    Complication of anesthesia    difficult to awaken from per pt   Coronary artery disease    Dyspnea    Essential hypertension    Heart disease    Heart murmur    History of gout    Hypothyroidism    Obesity (BMI 30-39.9) 11/11/2017   OSA (obstructive sleep apnea) 02/18/2017    severe obstructive sleep apnea with an AHI of 75.6/h and mild central sleep apnea with a CAI of 7.7/h.  Oxygen saturations dropped as low as 82%.   He is on CPAP at 9 cm H2O.   OSA on CPAP    Personal history of chemotherapy    Personal history of radiation therapy    Presence of permanent cardiac pacemaker    Primary localized osteoarthritis of right knee 05/13/2018   Refusal of blood transfusions as patient is Jehovah's Witness    Type II diabetes mellitus ([place])    Past Surgical History:  Procedure Laterality Date   BREAST BIOPSY Left 1985   BREAST LUMPECTOMY WITH NEEDLE LOCALIZATION AND AXILLARY LYMPH NODE DISSECTION  1985   CATARACT EXTRACTION W/ INTRAOCULAR LENS  IMPLANT, BILATERAL Bilateral    CORONARY ANGIOPLASTY WITH STENT PLACEMENT  2010, 2012,2017    2 done 2010 and 1 replaced 2012 and 2 replaced in 2017   [place] / REPLACE / REMOVE PACEMAKER  07/19/2014   JOINT REPLACEMENT     REPLACEMENT TOTAL KNEE Left 09/2012  THYROIDECTOMY, PARTIAL  1978   TONSILLECTOMY     TOTAL KNEE ARTHROPLASTY Right 05/13/2018   Procedure: TOTAL KNEE ARTHROPLASTY;  Surgeon: Marchia Bond, MD;  Location: [facility];  Service: Orthopedics;  Laterality: Right;   TUBAL LIGATION       Current Outpatient Medications  Medication Sig Dispense Refill   acetaminophen (TYLENOL) 500 MG tablet Take 500-1,000 mg by mouth every 6 (six) hours as needed (for pain or headaches).     allopurinol (ZYLOPRIM) 100 MG tablet Take 1 tablet (100 mg total) by mouth daily.     apixaban (ELIQUIS) 5 MG  TABS tablet Take 1 tablet (5 mg total) by mouth 2 (two) times daily. 60 tablet 3   atorvastatin (LIPITOR) 80 MG tablet Take 1 tablet (80 mg total) by mouth at bedtime. 90 tablet 3   Biotin 10 MG CAPS Take 10 mg by mouth daily.     Blood Glucose Monitoring Suppl (ACCU-CHEK AVIVA) device by Other route. Use as instructed     calcium citrate (CALCITRATE - DOSED IN MG ELEMENTAL CALCIUM) 950 MG tablet Take 1 tablet by mouth 2 (two) times daily.      carvedilol (COREG) 25 MG tablet Take 1 tablet (25 mg total) by mouth 2 (two) times daily with a meal. 180 tablet 3   CHONDROITIN SULFATE PO Take 250 mg by mouth daily.     colchicine 0.6 MG tablet TAKE 1 TABLET BY MOUTH EVERY OTHER DAY TO PREVENT FLARES     ferrous sulfate 325 (65 FE) MG tablet Take 325 mg by mouth daily with breakfast.     gabapentin (NEURONTIN) 300 MG capsule Take 300 mg by mouth daily.      glucose blood test strip Use as instructed to test blood sugars 3 times daily E11.65  Freestyle lite 400 each 3   insulin glargine (LANTUS) 100 UNIT/ML injection Inject 88 Units into the skin daily.     insulin lispro (HUMALOG KWIKPEN) 200 UNIT/ML KwikPen Inject 28 Units into the skin 3 (three) times daily. 30 mL 6   Insulin Pen Needle 32G X 4 MM MISC 1 Device by Does not apply route 4 (four) times daily. 400 each 3   isosorbide mononitrate (IMDUR) 30 MG 24 hr tablet TAKE 1 TABLET BY MOUTH ONCE DAILY 90 tablet 3   JARDIANCE 10 MG TABS tablet Take 1 tablet by mouth once daily 90 tablet 3   levothyroxine (SYNTHROID, LEVOTHROID) 75 MCG tablet Take 75 mcg by mouth daily before breakfast.     losartan (COZAAR) 50 MG tablet TAKE 1 TABLET BY MOUTH AT BEDTIME 90 tablet 3   Multiple Vitamins-Minerals (ALIVE ONCE DAILY WOMENS 50+ PO) Take 1 tablet by mouth daily.     nitroGLYCERIN (NITROSTAT) 0.4 MG SL tablet DISSOLVE ONE TABLET UNDER THE TONGUE EVERY 5 MINUTES AS NEEDED FOR CHEST PAIN. 25 tablet 3   Omega-3 Fatty Acids (FISH OIL PO) Take 1  capsule by mouth daily.     omeprazole (PRILOSEC) 20 MG capsule Take 20 mg by mouth daily.      potassium chloride (KLOR-CON) 10 MEQ tablet Take 2 tablets (20 mEq total) by mouth daily. 180 tablet 3   spironolactone (ALDACTONE) 25 MG tablet Take 1 tablet (25 mg total) by mouth every other day. Needs appt for further refills 30 tablet 2   torsemide (DEMADEX) 20 MG tablet Take 2 tablets (40 mg total) by mouth daily. 60 tablet 3   Travoprost, BAK Free, (TRAVATAN) 0.004 % SOLN ophthalmic solution  Place 1 drop into both eyes at bedtime.      No current facility-administered medications for this visit.    Allergies:   Other, Sulfa antibiotics, and Uloric [febuxostat]   Social History:  The patient  reports that she quit smoking about 38 years ago. Her smoking use included cigarettes. She has a 22.00 pack-year smoking history. She has never used smokeless tobacco. She reports current alcohol use. She reports previous drug use.   Family History:  The patient's family history includes Diabetes in her sister; Heart disease in her brother and father; Multiple myeloma in her mother; Other in her sister and sister.    ROS:  Please see the history of present illness.   Otherwise, review of systems is positive for none.   All other systems are reviewed and negative.   PHYSICAL EXAM: VS:  BP 140/74    Pulse 67    Ht _0  (1.575 m)    Wt 227 lb (103 kg)    SpO2 98%    BMI 41.52 kg/m  , BMI Body mass index is 41.52 kg/m. GEN: Well nourished, well developed, in no acute distress  HEENT: normal  Neck: no JVD, carotid bruits, or masses Cardiac: RRR; no murmurs, rubs, or gallops,no edema  Respiratory:  clear to auscultation bilaterally, normal work of breathing GI: soft, nontender, nondistended, + BS MS: no deformity or atrophy  Skin: warm and dry, device site well healed Neuro:  Strength and sensation are intact Psych: euthymic mood, full affect  EKG:  EKG is ordered today. Personal review of the  ekg ordered shows sinus rhythm, rate 67  Personal review of the device interrogation today. Results in [place] really   Recent Labs: 10/20/2019: ALT 25; B Natriuretic Peptide 69.2; BUN 29; Creatinine, Ser 1.59; Hemoglobin 11.6; Platelets 196; Potassium 4.5; Sodium 138; TSH 3.535    Lipid Panel     Component Value Date/Time   CHOL 150 10/20/2019 0921   TRIG 206 (H) 10/20/2019 0921   HDL 44 10/20/2019 0921   CHOLHDL 3.4 10/20/2019 0921   VLDL 41 (H) 10/20/2019 0921   LDLCALC 65 10/20/2019 0921     Wt Readings from Last 3 Encounters:  11/03/19 227 lb (103 kg)  10/20/19 214 lb 9.6 oz (97.3 kg)  10/19/19 214 lb 6.4 oz (97.3 kg)      Other studies Reviewed: Additional studies/ records that were reviewed today include: TTE 02/04/17  Review of the above records today demonstrates:  - Left ventricle: The cavity size was normal. Wall thickness was   increased in a pattern of mild LVH. Systolic function was mildly   reduced. The estimated ejection fraction was in the range of 45%   to 50%. Features are consistent with a pseudonormal left   ventricular filling pattern, with concomitant abnormal relaxation   and increased filling pressure (grade 2 diastolic dysfunction).   Doppler parameters are consistent with high ventricular filling   pressure. - Aortic valve: There was trivial regurgitation. - Mitral valve: Calcified annulus. Mildly thickened leaflets .   There was mild regurgitation. - Left atrium: The atrium was severely dilated. - Pulmonary arteries: PA peak pressure: 44 mm Hg (S).  Left heart cath 02/13/16 Left main normal LAD multiple stents in the mid to distal LAD. Mid LAD 80% stenosis distal LAD 99% stenosis, drug-eluting stent placed. Proximal LAD 30% Mid circumflex 50%, FFR 0.94 Proximal RCA 25%, mid RCA 25%, PDA 30%  ASSESSMENT AND PLAN:  1.  Chronic systolic heart  failure due to ischemic cardiomyopathy: Ejection fraction 45 to 50% in a restrictive pattern.   Optimal medical therapy.  She has had nonsustained VT and is now on high-dose carvedilol.  No further episodes.  No changes.  2. Coronary artery disease with chronic stable angina: Status post LAD stent 2010, 2012.  No aspirin.    3. Hypertension: Mildly elevated today but usually well controlled.  No changes.  4. Sick sinus syndrome: Status post Medtronic dual-chamber pacemaker.  Device functioning appropriately.  No changes..  5. Obstructive sleep apnea: CPAP compliance encouraged  6.   nonsustained ventricular tachycardia: Found on device interrogation.  Currently on carvedilol 25 mg.  No further episodes  7.  Polyneuropathy: Likely due to poorly controlled diabetes.  Has a discolored rated toe on her left foot.  Has ABIs planned.  Current medicines are reviewed at length with the patient today.   The patient does not have concerns regarding her medicines.  The following changes were made today:  none  Labs/ tests ordered today include:  Orders Placed This Encounter  Procedures   EKG 12-Lead     Disposition:   FU with Linken Mcglothen 12 months  Signed, Cherye Gaertner Meredith Leeds, MD  11/03/2019 4:07 PM     [address] [place] [address] [phone] (office) [phone] (fax)

## 2019-11-05 ENCOUNTER — Other Ambulatory Visit: Payer: Self-pay

## 2019-11-05 ENCOUNTER — Ambulatory Visit (INDEPENDENT_AMBULATORY_CARE_PROVIDER_SITE_OTHER): Payer: Medicare HMO | Admitting: Ophthalmology

## 2019-11-05 ENCOUNTER — Encounter (INDEPENDENT_AMBULATORY_CARE_PROVIDER_SITE_OTHER): Payer: Self-pay | Admitting: Ophthalmology

## 2019-11-05 DIAGNOSIS — E113411 Type 2 diabetes mellitus with severe nonproliferative diabetic retinopathy with macular edema, right eye: Secondary | ICD-10-CM | POA: Diagnosis not present

## 2019-11-05 DIAGNOSIS — H3562 Retinal hemorrhage, left eye: Secondary | ICD-10-CM

## 2019-11-05 DIAGNOSIS — E113412 Type 2 diabetes mellitus with severe nonproliferative diabetic retinopathy with macular edema, left eye: Secondary | ICD-10-CM

## 2019-11-05 DIAGNOSIS — H3561 Retinal hemorrhage, right eye: Secondary | ICD-10-CM | POA: Diagnosis not present

## 2019-11-05 MED ORDER — BEVACIZUMAB CHEMO INJECTION 1.25MG/0.05ML SYRINGE FOR KALEIDOSCOPE
1.2500 mg | INTRAVITREAL | Status: AC | PRN
  Administered 2019-11-05: 1.25 mg via INTRAVITREAL

## 2019-11-05 NOTE — Progress Notes (Signed)
11/05/2019     CHIEF COMPLAINT Patient presents for Diabetic Eye Exam   HISTORY OF PRESENT ILLNESS: Danielle [name] is a 74 y.o. female who presents to the clinic today for:   HPI    Diabetic Eye Exam    Vision is stable.  Associated Symptoms Floaters.  Negative for Flashes.  Diabetes characteristics include Type 2.  Blood sugar level fluctuates.  Last A1C 8.8.  I, the attending physician,  performed the HPI with the patient and updated documentation appropriately.          Comments    9 Month Diabetic Exam OU. OCT  Pt saw Dr. Katy Fitch last week and states she was told to follow up with her retina specialist. Pt states OU have been very dry. Pt denies any changes in vision. Sees small floaters in vision. BGL: did not chedck       Last edited by Hurman Horn, MD on 11/05/2019 11:18 AM. (History)      Referring physician: Seward Carol, MD [address] [place],  [place] [address]  HISTORICAL INFORMATION:   Selected notes from the MEDICAL RECORD NUMBER    Lab Results  Component Value Date   HGBA1C 8.8 (A) 10/19/2019     CURRENT MEDICATIONS: Current Outpatient Medications (Ophthalmic Drugs)  Medication Sig  . Travoprost, BAK Free, (TRAVATAN) 0.004 % SOLN ophthalmic solution Place 1 drop into both eyes at bedtime.    No current facility-administered medications for this visit. (Ophthalmic Drugs)   Current Outpatient Medications (Other)  Medication Sig  . acetaminophen (TYLENOL) 500 MG tablet Take 500-1,000 mg by mouth every 6 (six) hours as needed (for pain or headaches).  Marland Kitchen allopurinol (ZYLOPRIM) 100 MG tablet Take 1 tablet (100 mg total) by mouth daily.  Marland Kitchen apixaban (ELIQUIS) 5 MG TABS tablet Take 1 tablet (5 mg total) by mouth 2 (two) times daily.  Marland Kitchen atorvastatin (LIPITOR) 80 MG tablet Take 1 tablet (80 mg total) by mouth at bedtime.  . Biotin 10 MG CAPS Take 10 mg by mouth daily.  . Blood Glucose Monitoring Suppl (ACCU-CHEK AVIVA) device by Other route.  Use as instructed  . calcium citrate (CALCITRATE - DOSED IN MG ELEMENTAL CALCIUM) 950 MG tablet Take 1 tablet by mouth 2 (two) times daily.   . carvedilol (COREG) 25 MG tablet Take 1 tablet (25 mg total) by mouth 2 (two) times daily with a meal.  . CHONDROITIN SULFATE PO Take 250 mg by mouth daily.  . colchicine 0.6 MG tablet TAKE 1 TABLET BY MOUTH EVERY OTHER DAY TO PREVENT FLARES  . ferrous sulfate 325 (65 FE) MG tablet Take 325 mg by mouth daily with breakfast.  . gabapentin (NEURONTIN) 300 MG capsule Take 300 mg by mouth daily.   Marland Kitchen glucose blood test strip Use as instructed to test blood sugars 3 times daily E11.65  Freestyle lite  . insulin glargine (LANTUS) 100 UNIT/ML injection Inject 88 Units into the skin daily.  . insulin lispro (HUMALOG KWIKPEN) 200 UNIT/ML KwikPen Inject 28 Units into the skin 3 (three) times daily.  . Insulin Pen Needle 32G X 4 MM MISC 1 Device by Does not apply route 4 (four) times daily.  . isosorbide mononitrate (IMDUR) 30 MG 24 hr tablet TAKE 1 TABLET BY MOUTH ONCE DAILY  . JARDIANCE 10 MG TABS tablet Take 1 tablet by mouth once daily  . levothyroxine (SYNTHROID, LEVOTHROID) 75 MCG tablet Take 75 mcg by mouth daily before breakfast.  .  losartan (COZAAR) 50 MG tablet TAKE 1 TABLET BY MOUTH AT BEDTIME  . Multiple Vitamins-Minerals (ALIVE ONCE DAILY WOMENS 50+ PO) Take 1 tablet by mouth daily.  . nitroGLYCERIN (NITROSTAT) 0.4 MG SL tablet DISSOLVE ONE TABLET UNDER THE TONGUE EVERY 5 MINUTES AS NEEDED FOR CHEST PAIN.  Marland Kitchen Omega-3 Fatty Acids (FISH OIL PO) Take 1 capsule by mouth daily.  Marland Kitchen omeprazole (PRILOSEC) 20 MG capsule Take 20 mg by mouth daily.   . potassium chloride (KLOR-CON) 10 MEQ tablet Take 2 tablets (20 mEq total) by mouth daily.  Marland Kitchen spironolactone (ALDACTONE) 25 MG tablet Take 1 tablet (25 mg total) by mouth every other day. Needs appt for further refills  . torsemide (DEMADEX) 20 MG tablet Take 2 tablets (40 mg total) by mouth daily.   No current  facility-administered medications for this visit. (Other)      REVIEW OF SYSTEMS: ROS    Positive for: Endocrine   Last edited by Tilda Franco on 11/05/2019 10:02 AM. (History)       ALLERGIES Allergies  Allergen Reactions  . Other Other (See Comments)    Blood products- Patient won't accept any  . Sulfa Antibiotics Rash  . Uloric [Febuxostat] Rash    PAST MEDICAL HISTORY Past Medical History:  Diagnosis Date  . 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 Acute kidney failure ([place]) 05/15/2018  . Arthritis    "all over" (05/22/2018)  . Blood dyscrasia    per pt-has small blood cells-appears as if anemic  . Breast cancer, left breast ([place]) 1985  . CHF (congestive heart failure) ([place])   . Complication of anesthesia    difficult to awaken from per pt  . Coronary artery disease   . Dyspnea   . Essential hypertension   . Heart disease   . Heart murmur   . History of gout   . Hypothyroidism   . Obesity (BMI 30-39.9) 11/11/2017  . OSA (obstructive sleep apnea) 02/18/2017    severe obstructive sleep apnea with an AHI of 75.6/h and mild central sleep apnea with a CAI of 7.7/h.  Oxygen saturations dropped as low as 82%.   He is on CPAP at 9 cm H2O.  . OSA on CPAP   . Personal history of chemotherapy   . Personal history of radiation therapy   . Presence of permanent cardiac pacemaker   . Primary localized osteoarthritis of right knee 05/13/2018  . Refusal of blood transfusions as patient is Jehovah's Witness   . Type II diabetes mellitus ([place])    Past Surgical History:  Procedure Laterality Date  . BREAST BIOPSY Left 1985  . BREAST LUMPECTOMY WITH NEEDLE LOCALIZATION AND AXILLARY LYMPH NODE DISSECTION  1985  . CATARACT EXTRACTION W/ INTRAOCULAR LENS  IMPLANT, BILATERAL Bilateral   . CORONARY ANGIOPLASTY WITH STENT PLACEMENT  2010, 2012,2017    2 done 2010  and 1 replaced 2012 and 2 replaced in 2017  . DILATION AND CURETTAGE OF UTERUS    . INSERT / REPLACE / REMOVE PACEMAKER  07/19/2014  . JOINT REPLACEMENT    . REPLACEMENT TOTAL KNEE Left 09/2012  . THYROIDECTOMY, PARTIAL  1978  . TONSILLECTOMY    . TOTAL KNEE ARTHROPLASTY Right 05/13/2018   Procedure: TOTAL KNEE ARTHROPLASTY;  Surgeon: Marchia Bond, MD;  Location: [facility];  Service: Orthopedics;  Laterality: Right;  . TUBAL LIGATION      FAMILY  HISTORY Family History  Problem Relation Age of Onset  . Multiple myeloma Mother   . Heart disease Father   . Other Sister   . Heart disease Brother   . Diabetes Sister   . Other Sister     SOCIAL HISTORY Social History   Tobacco Use  . Smoking status: Former Smoker    Packs/day: 1.00    Years: 22.00    Pack years: 22.00    Types: Cigarettes    Quit date: 1983    Years since quitting: 38.4  . Smokeless tobacco: Never Used  Vaping Use  . Vaping Use: Never used  Substance Use Topics  . Alcohol use: Yes    Comment: 05/22/2018 "glass of wine couple times/wk"  . Drug use: Not Currently         OPHTHALMIC EXAM:  Base Eye Exam    Visual Acuity (Snellen - Linear)      Right Left   Dist cc 20/30 -2 20/40 -1   Dist ph cc  NI   Correction: Glasses       Tonometry (Tonopen, 10:06 AM)      Right Left   Pressure 13 10       Pupils      Pupils Dark Light Shape React APD   Right PERRL 3 2.5 Round Brisk None   Left PERRL 3 2.5 Round Brisk None       Visual Fields (Counting fingers)      Left Right    Full Full       Neuro/Psych    Oriented x3: Yes   Mood/Affect: Normal       Dilation    Both eyes: 1.0% Mydriacyl, 2.5% Phenylephrine @ 10:06 AM        Slit Lamp and Fundus Exam    External Exam      Right Left   External Normal Normal       Slit Lamp Exam      Right Left   Lids/Lashes Normal Normal   Conjunctiva/Sclera White and quiet White and quiet   Cornea Clear Clear   Anterior Chamber Deep and quiet  Deep and quiet   Iris Round and reactive Round and reactive   Lens Centered posterior chamber intraocular lens, Open posterior capsule Centered posterior chamber intraocular lens, Open posterior capsule   Anterior Vitreous Normal Normal       Fundus Exam      Right Left   Posterior Vitreous Normal Normal   Disc Normal Normal   C/D Ratio 0.75 0.75   Macula Microaneurysms, no macular thickening Microaneurysms, macular thickening, Severe clinically significant macular edema centered threatening but not center involved OS., Exudates   Vessels NPDR- Moderate NPDR- Moderate   Periphery Normal Normal          IMAGING AND PROCEDURES  Imaging and Procedures for 11/05/19  OCT, Retina - OU - Both Eyes       Right Eye Quality was good. Scan locations included subfoveal. Central Foveal Thickness: 275.   Left Eye Quality was good. Scan locations included subfoveal. Central Foveal Thickness: 269.   Notes OD, Stable,, OS, increased and new CSME temporal to the fovea will need injection intravitreal Avastin                ASSESSMENT/PLAN:  Severe nonproliferative diabetic retinopathy of left eye, with macular edema, associated with type 2 diabetes mellitus ([place])  The nature of diabetic macular edema was discussed with the patient. Treatment options  were outlined including medical therapy, laser & vitrectomy. The use of injectable medications reviewed, including Avastin, Lucentis, and Eylea. Periodic injections into the eye are likely to resolve diabetic macular edema (swelling in the center of vision). Initially, injections are delivered are delivered every 4-6 weeks, and the interval extended as the condition improves. On average, 8-9 injections the first year, and 5 in year 2. Improvement in the condition most often improves on medical therapy. Occasional use of focal laser is also recommended for residual macular edema (swelling). Excellent control of blood glucose and blood pressure  are encouraged under the care of a primary physician or endocrinologist. Similarly, attempts to maintain serum cholesterol, low density lipoproteins, and high-density lipoproteins in a favorable range were recommended. OS, option intravitreal Avastin or Laser       ICD-10-CM   1. Severe nonproliferative diabetic retinopathy of left eye, with macular edema, associated with type 2 diabetes mellitus (HCC)  W09.8119 OCT, Retina - OU - Both Eyes    Intravitreal Injection, Pharmacologic Agent - OS - Left Eye  2. Severe nonproliferative diabetic retinopathy of right eye, with macular edema, associated with type 2 diabetes mellitus (HCC)  E11.3411 OCT, Retina - OU - Both Eyes  3. Retinal hemorrhage of right eye  H35.61   4. Retinal hemorrhage of left eye  H35.62     1.OS, increased and new CSME temporal to the fovea will need injection intravitreal Avastin I discussed the use of intravitreal Avastin followed thereafter by focal laser due to the patient's severe anxiety thinking of injections into the eye  2.  3.  Ophthalmic Meds Ordered this visit:  No orders of the defined types were placed in this encounter.      No follow-ups on file.  There are no Patient Instructions on file for this visit.   Explained the diagnoses, plan, and follow up with the patient and they expressed understanding.  Patient expressed understanding of the importance of proper follow up care.   Clent Demark Rankin M.D. Diseases & Surgery of the Retina and Vitreous [facility] 11/05/19     Abbreviations: M myopia (nearsighted); A astigmatism; H hyperopia (farsighted); P presbyopia; Mrx spectacle prescription;  CTL contact lenses; OD right eye; OS left eye; OU both eyes  XT exotropia; ET esotropia; PEK punctate epithelial keratitis; PEE punctate epithelial erosions; DES dry eye syndrome; MGD meibomian gland dysfunction; ATs artificial tears; PFAT's preservative free artificial tears; [place] nuclear  sclerotic cataract; PSC posterior subcapsular cataract; ERM epi-retinal membrane; PVD posterior vitreous detachment; RD retinal detachment; DM diabetes mellitus; DR diabetic retinopathy; NPDR non-proliferative diabetic retinopathy; PDR proliferative diabetic retinopathy; CSME clinically significant macular edema; DME diabetic macular edema; dbh dot blot hemorrhages; CWS cotton wool spot; POAG primary open angle glaucoma; C/D cup-to-disc ratio; HVF humphrey visual field; GVF goldmann visual field; OCT optical coherence tomography; IOP intraocular pressure; BRVO Branch retinal vein occlusion; CRVO central retinal vein occlusion; CRAO central retinal artery occlusion; BRAO branch retinal artery occlusion; RT retinal tear; SB scleral buckle; PPV pars plana vitrectomy; VH Vitreous hemorrhage; PRP panretinal laser photocoagulation; IVK intravitreal kenalog; VMT vitreomacular traction; MH Macular hole;  NVD neovascularization of the disc; NVE neovascularization elsewhere; AREDS age related eye disease study; ARMD age related macular degeneration; POAG primary open angle glaucoma; EBMD epithelial/anterior basement membrane dystrophy; ACIOL anterior chamber intraocular lens; IOL intraocular lens; PCIOL posterior chamber intraocular lens; Phaco/IOL phacoemulsification with intraocular lens placement; PRK photorefractive keratectomy; LASIK laser assisted in situ keratomileusis; HTN hypertension; DM  diabetes mellitus; COPD chronic obstructive pulmonary disease

## 2019-11-05 NOTE — Assessment & Plan Note (Signed)
The nature of diabetic macular edema was discussed with the patient. Treatment options were outlined including medical therapy, laser & vitrectomy. The use of injectable medications reviewed, including Avastin, Lucentis, and Eylea. Periodic injections into the eye are likely to resolve diabetic macular edema (swelling in the center of vision). Initially, injections are delivered are delivered every 4-6 weeks, and the interval extended as the condition improves. On average, 8-9 injections the first year, and 5 in year 2. Improvement in the condition most often improves on medical therapy. Occasional use of focal laser is also recommended for residual macular edema (swelling). Excellent control of blood glucose and blood pressure are encouraged under the care of a primary physician or endocrinologist. Similarly, attempts to maintain serum cholesterol, low density lipoproteins, and high-density lipoproteins in a favorable range were recommended. OS, option intravitreal Avastin or Laser

## 2019-11-09 ENCOUNTER — Other Ambulatory Visit: Payer: Self-pay

## 2019-11-09 ENCOUNTER — Ambulatory Visit (HOSPITAL_COMMUNITY)
Admission: RE | Admit: 2019-11-09 | Discharge: 2019-11-09 | Disposition: A | Discharge status: Home / Self Care | Payer: Medicare HMO | Source: Ambulatory Visit | Attending: Cardiology | Admitting: Cardiology

## 2019-11-09 DIAGNOSIS — I739 Peripheral vascular disease, unspecified: Secondary | ICD-10-CM

## 2019-11-10 ENCOUNTER — Telehealth: Payer: Self-pay | Admitting: Physician Assistant

## 2019-11-10 ENCOUNTER — Other Ambulatory Visit (HOSPITAL_COMMUNITY): Payer: Self-pay

## 2019-11-10 NOTE — Progress Notes (Signed)
Paramedicine Encounter    Patient ID: Danielle [name], female    DOB: Oct 05, 1945, 74 y.o.   MRN: [id number]   Patient Care Team: Seward Carol, MD as PCP - General (Internal Medicine) Constance Haw, MD as PCP - Electrophysiology (Cardiology) Jorge Ny, LCSW as Social Worker (Licensed Clinical Social Worker)  Patient Active Problem List   Diagnosis Date Noted  . Severe nonproliferative diabetic retinopathy of left eye, with macular edema, associated with type 2 diabetes mellitus ([place]) 11/05/2019  . Severe nonproliferative diabetic retinopathy of right eye, with macular edema, associated with type 2 diabetes mellitus ([place]) 11/05/2019  . Retinal hemorrhage of right eye 11/05/2019  . Retinal hemorrhage of left eye 11/05/2019  . Type 2 diabetes mellitus with hyperglycemia, with long-term current use of insulin ([place]) 05/14/2019  . Diabetes mellitus ([place]) 05/14/2019  . 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, with long-term current use of insulin ([place]) 05/14/2019  . Type 2 diabetes mellitus with diabetic polyneuropathy, with long-term current use of insulin ([place]) 05/14/2019  . Encephalopathy acute 05/20/2018  . 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 Acute kidney failure ([place]) 05/15/2018  . Primary localized osteoarthritis of right knee 05/13/2018  . (HFpEF) heart failure with preserved ejection fraction ([place]) 12/20/2017  . Obesity (BMI 30-39.9) 11/11/2017  . OSA (obstructive sleep apnea) 02/18/2017  . Essential hypertension   . Diabetes mellitus without complication ([place])   . Cancer ([place])   . Thyroid disease     Current Outpatient Medications:  .  acetaminophen (TYLENOL) 500 MG tablet, Take 500-1,000 mg by mouth every 6 (six) hours as needed (for pain or headaches)., Disp: , Rfl:  .  apixaban (ELIQUIS) 5 MG TABS tablet, Take 1 tablet (5 mg total) by mouth 2 (two) times daily., Disp: 60 tablet, Rfl: 3 .  atorvastatin (LIPITOR) 80 MG  tablet, Take 1 tablet (80 mg total) by mouth at bedtime., Disp: 90 tablet, Rfl: 3 .  Blood Glucose Monitoring Suppl (ACCU-CHEK AVIVA) device, by Other route. Use as instructed, Disp: , Rfl:  .  calcium citrate (CALCITRATE - DOSED IN MG ELEMENTAL CALCIUM) 950 MG tablet, Take 1 tablet by mouth 2 (two) times daily. , Disp: , Rfl:  .  carvedilol (COREG) 25 MG tablet, Take 1 tablet (25 mg total) by mouth 2 (two) times daily with a meal., Disp: 180 tablet, Rfl: 3 .  ferrous sulfate 325 (65 FE) MG tablet, Take 325 mg by mouth daily with breakfast., Disp: , Rfl:  .  gabapentin (NEURONTIN) 300 MG capsule, Take 300 mg by mouth daily. , Disp: , Rfl:  .  glucose blood test strip, Use as instructed to test blood sugars 3 times daily E11.65  Freestyle lite, Disp: 400 each, Rfl: 3 .  insulin glargine (LANTUS) 100 UNIT/ML injection, Inject 88 Units into the skin daily., Disp: , Rfl:  .  insulin lispro (HUMALOG KWIKPEN) 200 UNIT/ML KwikPen, Inject 28 Units into the skin 3 (three) times daily., Disp: 30 mL, Rfl: 6 .  Insulin Pen Needle 32G X 4 MM MISC, 1 Device by Does not apply route 4 (four) times daily., Disp: 400 each, Rfl: 3 .  isosorbide mononitrate (IMDUR) 30 MG 24 hr tablet, TAKE 1 TABLET BY MOUTH ONCE DAILY, Disp: 90 tablet, Rfl: 3 .  JARDIANCE 10 MG TABS tablet, Take 1 tablet by mouth once daily, Disp: 90 tablet, Rfl: 3 .  levothyroxine (SYNTHROID, LEVOTHROID) 75 MCG tablet, Take 75 mcg by mouth daily  before breakfast., Disp: , Rfl:  .  losartan (COZAAR) 50 MG tablet, TAKE 1 TABLET BY MOUTH AT BEDTIME, Disp: 90 tablet, Rfl: 3 .  Multiple Vitamins-Minerals (ALIVE ONCE DAILY WOMENS 50+ PO), Take 1 tablet by mouth daily., Disp: , Rfl:  .  Omega-3 Fatty Acids (FISH OIL PO), Take 1 capsule by mouth daily., Disp: , Rfl:  .  omeprazole (PRILOSEC) 20 MG capsule, Take 20 mg by mouth daily. , Disp: , Rfl:  .  potassium chloride (KLOR-CON) 10 MEQ tablet, Take 2 tablets (20 mEq total) by mouth daily., Disp: 180 tablet,  Rfl: 3 .  spironolactone (ALDACTONE) 25 MG tablet, Take 1 tablet (25 mg total) by mouth every other day. Needs appt for further refills, Disp: 30 tablet, Rfl: 2 .  torsemide (DEMADEX) 20 MG tablet, Take 2 tablets (40 mg total) by mouth daily., Disp: 60 tablet, Rfl: 3 .  Travoprost, BAK Free, (TRAVATAN) 0.004 % SOLN ophthalmic solution, Place 1 drop into both eyes at bedtime. , Disp: , Rfl:  .  allopurinol (ZYLOPRIM) 100 MG tablet, Take 1 tablet (100 mg total) by mouth daily. (Patient not taking: Reported on 11/10/2019), Disp: , Rfl:  .  Biotin 10 MG CAPS, Take 10 mg by mouth daily. (Patient not taking: Reported on 11/10/2019), Disp: , Rfl:  .  CHONDROITIN SULFATE PO, Take 250 mg by mouth daily. (Patient not taking: Reported on 11/10/2019), Disp: , Rfl:  .  colchicine 0.6 MG tablet, TAKE 1 TABLET BY MOUTH EVERY OTHER DAY TO PREVENT FLARES (Patient not taking: Reported on 11/10/2019), Disp: , Rfl:  .  nitroGLYCERIN (NITROSTAT) 0.4 MG SL tablet, DISSOLVE ONE TABLET UNDER THE TONGUE EVERY 5 MINUTES AS NEEDED FOR CHEST PAIN. (Patient not taking: Reported on 11/10/2019), Disp: 25 tablet, Rfl: 3 Allergies  Allergen Reactions  . Other Other (See Comments)    Blood products- Patient won't accept any  . Sulfa Antibiotics Rash  . Uloric [Febuxostat] Rash     Social History   Socioeconomic History  . Marital status: Widowed    Spouse name: Not on file  . Number of children: 3  . Years of education: Not on file  . Highest education level: Some college, no degree  Occupational History  . Occupation: retired  Tobacco Use  . Smoking status: Former Smoker    Packs/day: 1.00    Years: 22.00    Pack years: 22.00    Types: Cigarettes    Quit date: 1983    Years since quitting: 38.4  . Smokeless tobacco: Never Used  Vaping Use  . Vaping Use: Never used  Substance and Sexual Activity  . Alcohol use: Yes    Comment: 05/22/2018 "glass of wine couple times/wk"  . Drug use: Not Currently  . Sexual  activity: Not Currently    Birth control/protection: None, Post-menopausal  Other Topics Concern  . Not on file  Social History Narrative  . Not on file   Social Determinants of Health   Financial Resource Strain:   . Difficulty of Paying Living Expenses:   Food Insecurity:   . Worried About Charity fundraiser in the Last Year:   . Arboriculturist in the Last Year:   Transportation Needs:   . Film/video editor (Medical):   Marland Kitchen Lack of Transportation (Non-Medical):   Physical Activity:   . Days of Exercise per Week:   . Minutes of Exercise per Session:   Stress:   . Feeling of Stress :  Social Connections:   . Frequency of Communication with Friends and Family:   . Frequency of Social Gatherings with Friends and Family:   . Attends Religious Services:   . Active Member of Clubs or Organizations:   . Attends Archivist Meetings:   Marland Kitchen Marital Status:   Intimate Partner Violence:   . Fear of Current or Ex-Partner:   . Emotionally Abused:   Marland Kitchen Physically Abused:   . Sexually Abused:     Physical Exam Vitals reviewed.  Constitutional:      Appearance: Danielle [name] is normal weight.  HENT:     Head: Normocephalic.     Nose: Nose normal.     Mouth/Throat:     Mouth: Mucous membranes are moist.  Eyes:     Pupils: Pupils are equal, round, and reactive to light.  Cardiovascular:     Rate and Rhythm: Normal rate and regular rhythm.     Pulses: Normal pulses.     Heart sounds: Normal heart sounds.  Pulmonary:     Effort: Pulmonary effort is normal.     Breath sounds: Normal breath sounds.  Abdominal:     Palpations: Abdomen is soft.  Musculoskeletal:        General: Swelling present. Normal range of motion.     Cervical back: Normal range of motion.     Right lower leg: Edema present.     Left lower leg: Edema present.     Comments: Left foot, second toe discolored with no pain noted. PMS strong in left foot.   Skin:    Capillary Refill: Capillary refill takes  less than 2 seconds.  Neurological:     Mental Status: Danielle [name] is alert. Mental status is at baseline.  Psychiatric:        Mood and Affect: Mood normal.    Arrived for home visit for Lasana who was standing in Danielle [name] apartment alert and oriented. Danielle [name] reports feeling okay other than having some sore muscle pain in Danielle [name] right groin after ultra sounds yesterday at vascular appointment. Danielle [name] states it is tender on movement and palpation. I advised Danielle [name] it would heal in the coming days and to take Tylenol as needed. Danielle [name] agreed. Beryle noted to have some clammy skin on assessment. Vitals assessed and CBG noted to be 61. Danielle [name] stated he ate earlier and took Danielle [name] insulin this morning as well. I assisted Danielle [name] in getting some juice and a snack. He explained Danielle [name] sugar hasn't dropped like this in quite a while. I informed Danielle [name] the importance of making Danielle [name] PCP aware of this. Danielle [name] agreed, and has an appointment on Thursday with him. Danielle [name] also knows to continue to check Danielle [name] sugar tonight and to do this daily multiple times. Danielle [name] agreed with same. Sugar was rechecked before I left and it was 78 after snack and juice. Danielle [name] was instructed to eat Danielle [name] dinner and recheck Danielle [name] sugar and to avoid Danielle [name] insulin tonight. Danielle [name] agreed. Medications were reviewed and confirmed. Pill box filled accordingly.   Appointments reviewed.  June 30th appointment with Radford Pax is a video conference to review CPAP settings, I contacted [facility]'s nurse Gae Bon and left message to see if I can assist with this due to patient having retina procedure same day on the 30th. I will continue to assist.    Home visit complete. I will see patient in one week         Future Appointments  Date Time Provider [place]  11/25/2019  9:40 AM  Sueanne Margarita, MD [facility]  11/25/2019 10:15 AM Rankin, Clent Demark, MD [facility] None  11/26/2019 10:00 AM [facility] PA/NP [facility] None  12/01/2019  7:20 AM [facility] DEVICE REMOTES [facility]   03/01/2020  7:20 AM [facility] DEVICE REMOTES [facility]  04/15/2020 10:10 AM Shamleffer, Melanie Crazier, MD [facility] None     ACTION: Home visit completed Next visit planned for one week

## 2019-11-10 NOTE — Telephone Encounter (Signed)
[facility] is calling requesting an appointment with Sharyn Lull to discuss her results due to not being able to keep the original that was scheduled. I advised Diondra Pines does not have anything available before July. She requested I send a message to have her worked in on 11/25/19 due to [facility] requesting the appointment to discuss the results. Please advise.

## 2019-11-11 NOTE — Telephone Encounter (Signed)
Called and spoke to patient. She states that she had LE ultrasounds done a couple of days ago and she has been having pain in her right groin since then. I have advised the patient to take tylenol for this. Patient is requesting something stronger than tylenol. I asked patient to reach out to her PCP. Patient states that she has not heard anything regarding her ultrasound results yet and was asking about a follow up appointment. Patient is currently being followed by heart failure and ultrasounds were ordered by Lyda Jester, PA. Made patient aware that I will forward a message to them and they will contact her with results and recommendations. Made patient aware that she has a virtual visit with Dr. Radford Pax on 6/30 at 9:40 AM for her OSA and she has an appointment with HF on 7/1 at 10:00 AM. Patient verbalized understanding and denies having any additional concerns that need to be addressed at this time.

## 2019-11-12 ENCOUNTER — Other Ambulatory Visit: Payer: Self-pay | Admitting: Internal Medicine

## 2019-11-12 DIAGNOSIS — M25559 Pain in unspecified hip: Secondary | ICD-10-CM

## 2019-11-12 DIAGNOSIS — R1030 Lower abdominal pain, unspecified: Secondary | ICD-10-CM

## 2019-11-16 ENCOUNTER — Telehealth (HOSPITAL_COMMUNITY): Payer: Self-pay

## 2019-11-16 NOTE — Telephone Encounter (Signed)
Spoke to [place] who requested to reschedule to next week due to her being out of town. Call complete.

## 2019-11-16 NOTE — Telephone Encounter (Signed)
Called and made patient aware of results and that they were being reviewed by Dr. Gwenlyn Found to determine if further workup needed. Patient verbalized understanding and thanked me for the call.   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 I routed the result report to Dr. Gwenlyn Found and asked him to review to see if we should refer her to [facility] clinic for further evaluation.

## 2019-11-18 ENCOUNTER — Telehealth: Payer: Self-pay | Admitting: *Deleted

## 2019-11-18 DIAGNOSIS — G4733 Obstructive sleep apnea (adult) (pediatric): Secondary | ICD-10-CM

## 2019-11-18 NOTE — Telephone Encounter (Signed)
-----   Message from Sueanne Margarita, MD sent at 11/05/2019 10:33 AM EDT ----- Regarding: RE: pressure change AHI is too high likely needs BIPAP instead of CPAP  Please refer for BiPAP titration to sleep lab  Traci ----- Message ----- From: Freada Bergeron, CMA Sent: 11/05/2019   9:35 AM EDT To: Sueanne Margarita, MD Subject: pressure change                                Patient complains of pressure blowing too hard.  Please advise  Thanks, Danielle [name], Danielle [name] 10/06/2019 - 11/04/2019 DOB: [birth year] Age: 6 years [place] [address] [place], [place] Phone: [phone] Email: [email or web address] Compliance Report Usage 10/06/2019 - 11/04/2019 Usage days 13/30 days (43%) >= 4 hours 7 days (23%) < 4 hours 6 days (20%) Usage hours 57 hours 30 minutes Average usage (total days) 1 hours 55 minutes Average usage (days used) 4 hours 25 minutes Median usage (days used) 4 hours 15 minutes Total used hours (value since last reset - 11/04/2019) 2,557 hours AirSense 10 AutoSet Serial number [id number] Mode CPAP Set pressure 9 cmH2O EPR Fulltime EPR level 3 Therapy Leaks - L/min Median: 21.0 95th percentile: 42.1 Maximum: 98.0 Events per hour AI: 19.4 HI: 0.4 AHI: 19.8 Apnea Index Central: 0.0 Obstructive: 1.5 Unknown: 17.8 RERA Index 0.2 Cheyne-Stokes respiration (average duration per night) 0 minutes (0%) Usage - hours Printed on 11/05/2019 - ResMed AirView version 4.23.0-2.0 Page 1 of 1

## 2019-11-18 NOTE — Telephone Encounter (Signed)
Video...[phone]...Marland KitchenMarland KitchenPast due sleep appt/caj...and discuss Failing cpap going to Bipap.

## 2019-11-20 ENCOUNTER — Telehealth (HOSPITAL_COMMUNITY): Payer: Self-pay | Admitting: Cardiology

## 2019-11-20 ENCOUNTER — Telehealth: Payer: Self-pay | Admitting: Cardiology

## 2019-11-20 MED ORDER — APIXABAN 5 MG PO TABS: 5.0000 mg | ORAL_TABLET | Freq: Two times a day (BID) | ORAL | 1 refills | Status: DC

## 2019-11-20 MED ORDER — ISOSORBIDE MONONITRATE ER 30 MG PO TB24: ORAL_TABLET | ORAL | 1 refills | Status: DC

## 2019-11-20 MED ORDER — LOSARTAN POTASSIUM 50 MG PO TABS: ORAL_TABLET | ORAL | 1 refills | Status: DC

## 2019-11-20 MED ORDER — EMPAGLIFLOZIN 10 MG PO TABS: 10.0000 mg | ORAL_TABLET | Freq: Every day | ORAL | 1 refills | Status: DC

## 2019-11-20 MED ORDER — TORSEMIDE 20 MG PO TABS: 40.0000 mg | ORAL_TABLET | Freq: Every day | ORAL | 3 refills | Status: DC

## 2019-11-20 MED ORDER — ATORVASTATIN CALCIUM 80 MG PO TABS: 80.0000 mg | ORAL_TABLET | Freq: Every day | ORAL | 1 refills | Status: DC

## 2019-11-20 MED ORDER — SPIRONOLACTONE 25 MG PO TABS: 25.0000 mg | ORAL_TABLET | ORAL | 1 refills | Status: DC

## 2019-11-20 MED ORDER — CARVEDILOL 25 MG PO TABS: 25.0000 mg | ORAL_TABLET | Freq: Two times a day (BID) | ORAL | 1 refills | Status: DC

## 2019-11-20 MED ORDER — POTASSIUM CHLORIDE CRYS ER 10 MEQ PO TBCR: 20.0000 meq | EXTENDED_RELEASE_TABLET | Freq: Every day | ORAL | 1 refills | Status: DC

## 2019-11-20 NOTE — Telephone Encounter (Signed)
Med refills adrdessed with pharmacy [facility] for all cardiac medication 1 refills  Gabapentin not refilled as patient will need to contact pcp

## 2019-11-20 NOTE — Telephone Encounter (Signed)
Patient called stating she had a question about one of her medications prescribed by Dr. Haroldine Laws. I transferred her over to his office.

## 2019-11-23 ENCOUNTER — Inpatient Hospital Stay (HOSPITAL_COMMUNITY)
Admission: RE | Admit: 2019-11-23 | Discharge: 2019-11-23 | Disposition: A | Discharge status: Home / Self Care | Payer: Medicare HMO | Source: Ambulatory Visit

## 2019-11-23 ENCOUNTER — Telehealth: Payer: Self-pay

## 2019-11-23 DIAGNOSIS — I5030 Unspecified diastolic (congestive) heart failure: Secondary | ICD-10-CM

## 2019-11-23 NOTE — Telephone Encounter (Signed)
Call placed to patient in follow up on referral to [facility]. Pt will not be back in [place] until Wednesday. Pt has my number for f/u on exercise program. She sts will call when she is back in town.

## 2019-11-24 ENCOUNTER — Ambulatory Visit: Payer: Medicare HMO | Admitting: Physician Assistant

## 2019-11-25 ENCOUNTER — Encounter (INDEPENDENT_AMBULATORY_CARE_PROVIDER_SITE_OTHER): Payer: Medicare HMO | Admitting: Ophthalmology

## 2019-11-25 ENCOUNTER — Telehealth (HOSPITAL_COMMUNITY): Payer: Self-pay | Admitting: *Deleted

## 2019-11-25 ENCOUNTER — Telehealth (INDEPENDENT_AMBULATORY_CARE_PROVIDER_SITE_OTHER): Payer: Medicare HMO | Admitting: Cardiology

## 2019-11-25 ENCOUNTER — Telehealth: Payer: Self-pay | Admitting: Cardiology

## 2019-11-25 ENCOUNTER — Other Ambulatory Visit: Payer: Self-pay

## 2019-11-25 VITALS — Ht 64.0 in | Wt 217.0 lb

## 2019-11-25 DIAGNOSIS — E669 Obesity, unspecified: Secondary | ICD-10-CM

## 2019-11-25 DIAGNOSIS — G4733 Obstructive sleep apnea (adult) (pediatric): Secondary | ICD-10-CM

## 2019-11-25 DIAGNOSIS — I10 Essential (primary) hypertension: Secondary | ICD-10-CM

## 2019-11-25 NOTE — Telephone Encounter (Signed)
New message   Patient states that she missed a call about her mychart visit at 9:40 am. Please call.

## 2019-11-25 NOTE — Progress Notes (Signed)
  Patient Consent for Virtual Visit         Danielle [name] has provided verbal consent on 11/25/2019 for a virtual visit (video or telephone).   CONSENT FOR VIRTUAL VISIT FOR:  Danielle [name]  By participating in this virtual visit I agree to the following:  I hereby voluntarily request, consent and authorize [facility] and its employed or contracted physicians, physician assistants, nurse practitioners or other licensed health care professionals (the Practitioner), to provide me with telemedicine health care services (the "Services") as deemed necessary by the treating Practitioner. I acknowledge and consent to receive the Services by the Practitioner via telemedicine. I understand that the telemedicine visit will involve communicating with the Practitioner through live audiovisual communication technology and the disclosure of certain medical information by electronic transmission. I acknowledge that I have been given the opportunity to request an in-person assessment or other available alternative prior to the telemedicine visit and am voluntarily participating in the telemedicine visit.  I understand that I have the right to withhold or withdraw my consent to the use of telemedicine in the course of my care at any time, without affecting my right to future care or treatment, and that the Practitioner or I may terminate the telemedicine visit at any time. I understand that I have the right to inspect all information obtained and/or recorded in the course of the telemedicine visit and may receive copies of available information for a reasonable fee.  I understand that some of the potential risks of receiving the Services via telemedicine include:  Marland Kitchen Delay or interruption in medical evaluation due to technological equipment failure or disruption; . Information transmitted may not be sufficient (e.g. poor resolution of images) to allow for appropriate medical decision making by the Practitioner;  and/or  . In rare instances, security protocols could fail, causing a breach of personal health information.  Furthermore, I acknowledge that it is my responsibility to provide information about my medical history, conditions and care that is complete and accurate to the best of my ability. I acknowledge that Practitioner's advice, recommendations, and/or decision may be based on factors not within their control, such as incomplete or inaccurate data provided by me or distortions of diagnostic images or specimens that may result from electronic transmissions. I understand that the practice of medicine is not an exact science and that Practitioner makes no warranties or guarantees regarding treatment outcomes. I acknowledge that a copy of this consent can be made available to me via my patient portal ([facility]), or I can request a printed copy by calling the office of [facility].    I understand that my insurance will be billed for this visit.   I have read or had this consent read to me. . I understand the contents of this consent, which adequately explains the benefits and risks of the Services being provided via telemedicine.  . I have been provided ample opportunity to ask questions regarding this consent and the Services and have had my questions answered to my satisfaction. . I give my informed consent for the services to be provided through the use of telemedicine in my medical care

## 2019-11-25 NOTE — Telephone Encounter (Signed)
Pt left vm asking for return call she had  questions regarding her upcoming appt. I called pt back no answer/ left vm for pt to return my call.

## 2019-11-25 NOTE — Progress Notes (Signed)
Virtual Visit via Telephone Note   This visit type was conducted due to national recommendations for restrictions regarding the COVID-19 Pandemic (e.g. social distancing) in an effort to limit this patient's exposure and mitigate transmission in our community.  Due to her co-morbid illnesses, this patient is at least at moderate risk for complications without adequate follow up.  This format is felt to be most appropriate for this patient at this time.  The patient did not have access to video technology/had technical difficulties with video requiring transitioning to audio format only (telephone).  All issues noted in this document were discussed and addressed.  No physical exam could be performed with this format.  Please refer to the patient's chart for her  consent to telehealth for [facility].   Evaluation Performed:  Follow-up visit  This visit type was conducted due to national recommendations for restrictions regarding the COVID-19 Pandemic (e.g. social distancing).  This format is felt to be most appropriate for this patient at this time.  All issues noted in this document were discussed and addressed.  No physical exam was performed (except for noted visual exam findings with Video Visits).  Please refer to the patient's chart (MyChart message for video visits and phone note for telephone visits) for the patient's consent to telehealth for [facility].  Date:  11/25/2019   ID:  Danielle [name], DOB 07-19-1945, MRN [id number]  Patient Location:  Home  Provider location:   [place]  PCP:  Seward Carol, MD  Cardiologist:  Pierre Bali, MD Sleep Medicine:  Fransico Him, MD Electrophysiologist:  Will Meredith Leeds, MD   Chief Complaint:  OSA  History of Present Illness:    Danielle [name] is a 74 y.o. female who presents via audio/video conferencing for a telehealth visit today.    Danielle [name] is a 74 y.o. female with a hx of chronic combined systolic/diastolic CHF,  diabetes mellitus, hypertension and obstructive sleep apnea.  She apparently had a history of sleep apnea in the past and was referred by Advanced heart failure service for sleep study.  This was due to excessive daytime fatigue and snoring.  Sleep study showed severe obstructive sleep apnea with an AHI of 75.6/h and mild central sleep apnea with a CAI of 7.7/h. Oxygen saturations dropped as low as 82%.  There was moderate snoring.  She he underwent CPAP titration to 9 cm H2O.  She is doing well with her CPAP device.  She tolerates the nasal mask but says that her mask is leaking.  She feels at time that the pressure is too high.  She uses a nasal mask and sometimes breathes through her mouth.  Since going on CPAP she feels rested in the am and has no significant daytime sleepiness.  She is having a lot of mouth dryness in the am.  She is snoring some with her CPAP.  The patient does not have symptoms concerning for COVID-19 infection (fever, chills, cough, or new shortness of breath).    Prior CV studies:   The following studies were reviewed today:  PAP compliance download  Past Medical History:  Diagnosis Date  . Acute combined systolic and diastolic congestive heart failure ([place]) 02/05/2017  . 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 Acute kidney failure ([place]) 05/15/2018  . Arthritis    "all over" (05/22/2018)  . Blood dyscrasia    per pt-has small blood cells-appears as if anemic  . Breast cancer, left breast ([place]) 1985  .  CHF (congestive heart failure) ([place])   . Complication of anesthesia    difficult to awaken from per pt  . Coronary artery disease   . Dyspnea   . Essential hypertension   . Heart disease   . Heart murmur   . History of gout   . Hypothyroidism   . Obesity (BMI 30-39.9) 11/11/2017  . OSA (obstructive sleep apnea) 02/18/2017    severe obstructive sleep apnea with an AHI of 75.6/h and mild central sleep apnea with a CAI of 7.7/h.   Oxygen saturations dropped as low as 82%.   He is on CPAP at 9 cm H2O.  . OSA on CPAP   . Personal history of chemotherapy   . Personal history of radiation therapy   . Presence of permanent cardiac pacemaker   . Primary localized osteoarthritis of right knee 05/13/2018  . Refusal of blood transfusions as patient is Jehovah's Witness   . Type II diabetes mellitus ([place])    Past Surgical History:  Procedure Laterality Date  . BREAST BIOPSY Left 1985  . BREAST LUMPECTOMY WITH NEEDLE LOCALIZATION AND AXILLARY LYMPH NODE DISSECTION  1985  . CATARACT EXTRACTION W/ INTRAOCULAR LENS  IMPLANT, BILATERAL Bilateral   . CORONARY ANGIOPLASTY WITH STENT PLACEMENT  2010, 2012,2017    2 done 2010 and 1 replaced 2012 and 2 replaced in 2017  . DILATION AND CURETTAGE OF UTERUS    . INSERT / REPLACE / REMOVE PACEMAKER  07/19/2014  . JOINT REPLACEMENT    . REPLACEMENT TOTAL KNEE Left 09/2012  . THYROIDECTOMY, PARTIAL  1978  . TONSILLECTOMY    . TOTAL KNEE ARTHROPLASTY Right 05/13/2018   Procedure: TOTAL KNEE ARTHROPLASTY;  Surgeon: Marchia Bond, MD;  Location: [facility]S;  Service: Orthopedics;  Laterality: Right;  . TUBAL LIGATION       Current Meds  Medication Sig  . acetaminophen (TYLENOL) 500 MG tablet Take 500-1,000 mg by mouth every 6 (six) hours as needed (for pain or headaches).  Marland Kitchen allopurinol (ZYLOPRIM) 100 MG tablet Take 1 tablet (100 mg total) by mouth daily.  Marland Kitchen apixaban (ELIQUIS) 5 MG TABS tablet Take 1 tablet (5 mg total) by mouth 2 (two) times daily.  Marland Kitchen atorvastatin (LIPITOR) 80 MG tablet Take 1 tablet (80 mg total) by mouth at bedtime.  . Biotin 10 MG CAPS Take 10 mg by mouth daily.   . Blood Glucose Monitoring Suppl (ACCU-CHEK AVIVA) device by Other route. Use as instructed  . calcium citrate (CALCITRATE - DOSED IN MG ELEMENTAL CALCIUM) 950 MG tablet Take 1 tablet by mouth 2 (two) times daily.   . carvedilol (COREG) 25 MG tablet Take 1 tablet (25 mg total) by mouth 2 (two) times daily  with a meal.  . CHONDROITIN SULFATE PO Take 250 mg by mouth daily.   . colchicine 0.6 MG tablet TAKE 1 TABLET BY MOUTH EVERY OTHER DAY TO PREVENT FLARES  . empagliflozin (JARDIANCE) 10 MG TABS tablet Take 1 tablet (10 mg total) by mouth daily.  . ferrous sulfate 325 (65 FE) MG tablet Take 325 mg by mouth daily with breakfast.  . gabapentin (NEURONTIN) 300 MG capsule Take 300 mg by mouth daily.   Marland Kitchen glucose blood test strip Use as instructed to test blood sugars 3 times daily E11.65  Freestyle lite  . insulin glargine (LANTUS) 100 UNIT/ML injection Inject 88 Units into the skin daily.  . insulin lispro (HUMALOG KWIKPEN) 200 UNIT/ML KwikPen Inject 28 Units into the skin 3 (three) times daily.  Marland Kitchen  Insulin Pen Needle 32G X 4 MM MISC 1 Device by Does not apply route 4 (four) times daily.  . isosorbide mononitrate (IMDUR) 30 MG 24 hr tablet TAKE 1 TABLET BY MOUTH ONCE DAILY  . levothyroxine (SYNTHROID, LEVOTHROID) 75 MCG tablet Take 75 mcg by mouth daily before breakfast.  . losartan (COZAAR) 50 MG tablet TAKE 1 TABLET BY MOUTH AT BEDTIME  . Multiple Vitamins-Minerals (ALIVE ONCE DAILY WOMENS 50+ PO) Take 1 tablet by mouth daily.  . nitroGLYCERIN (NITROSTAT) 0.4 MG SL tablet DISSOLVE ONE TABLET UNDER THE TONGUE EVERY 5 MINUTES AS NEEDED FOR CHEST PAIN.  Marland Kitchen Omega-3 Fatty Acids (FISH OIL PO) Take 1 capsule by mouth daily.  . potassium chloride (KLOR-CON) 10 MEQ tablet Take 2 tablets (20 mEq total) by mouth daily.  Marland Kitchen spironolactone (ALDACTONE) 25 MG tablet Take 1 tablet (25 mg total) by mouth every other day.  . torsemide (DEMADEX) 20 MG tablet Take 2 tablets (40 mg total) by mouth daily.  . Travoprost, BAK Free, (TRAVATAN) 0.004 % SOLN ophthalmic solution Place 1 drop into both eyes at bedtime.      Allergies:   Other, Sulfa antibiotics, and Uloric [febuxostat]   Social History   Tobacco Use  . Smoking status: Former Smoker    Packs/day: 1.00    Years: 22.00    Pack years: 22.00    Types:  Cigarettes    Quit date: 1983    Years since quitting: 38.5  . Smokeless tobacco: Never Used  Vaping Use  . Vaping Use: Never used  Substance Use Topics  . Alcohol use: Yes    Comment: 05/22/2018 "glass of wine couple times/wk"  . Drug use: Not Currently     Family Hx: The patient's family history includes Diabetes in her sister; Heart disease in her brother and father; Multiple myeloma in her mother; Other in her sister and sister.  ROS:   Please see the history of present illness.     All other systems reviewed and are negative.   Labs/Other Tests and Data Reviewed:    Recent Labs: 10/20/2019: ALT 25; B Natriuretic Peptide 69.2; BUN 29; Creatinine, Ser 1.59; Hemoglobin 11.6; Platelets 196; Potassium 4.5; Sodium 138; TSH 3.535   Recent Lipid Panel Lab Results  Component Value Date/Time   CHOL 150 10/20/2019 09:21 AM   TRIG 206 (H) 10/20/2019 09:21 AM   HDL 44 10/20/2019 09:21 AM   CHOLHDL 3.4 10/20/2019 09:21 AM   LDLCALC 65 10/20/2019 09:21 AM    Wt Readings from Last 3 Encounters:  11/25/19 217 lb (98.4 kg)  11/10/19 222 lb (100.7 kg)  11/03/19 227 lb (103 kg)     Objective:    Vital Signs:  Ht 5' 4"  (1.626 m)   Wt 217 lb (98.4 kg)   BMI 37.25 kg/m     ASSESSMENT & PLAN:    1.  OSA - The patient is tolerating PAP therapy well without any problems. The PAP download was reviewed today and showed an AHI of 19.1/hr on 9 cm H2O with 37% compliance in using more than 4 hours nightly.  The patient has been using and benefiting from PAP use and will continue to benefit from therapy.  -I think her apneas are up due to her mask leaking and breathing through her mouth -I will change her to a ResMed Airfit FFM with tubing attaching at the top of her head -I will get a download in 4 weeks and if AHI is still too hight  then change to auto PAP  2.  HTN -continue carvedilol 72m BID, Imudr 310mdaily, [place] 5021maily and spiro 47m38md  3.  Obesity -I have  encouraged her to get into a routine exercise program and cut back on carbs and portions.    COVID-19 Education: She has gotten both COVID 19 vaccinations   Patient Risk:   After full review of this patient's clinical status, I feel that they are at least moderate risk at this time.  Time:   Today, I have spent 20 minutes on telemedicine discussing medical problems including OSA, HTN, obesity and reviewing patient's chart including PAP compliance download.  Medication Adjustments/Labs and Tests Ordered: Current medicines are reviewed at length with the patient today.  Concerns regarding medicines are outlined above.  Tests Ordered: No orders of the defined types were placed in this encounter.  Medication Changes: No orders of the defined types were placed in this encounter.   Disposition:  Follow up in 1 year(s)  Signed, TracFransico Him  11/25/2019 9:35 AM    [facility]

## 2019-11-25 NOTE — Telephone Encounter (Signed)
Please order a ResMed Airfit FFM with tubing attaching at the top of her head. Get a download in 4 weeks.  [facility] to [facility], 4 week reminder made, 1 year follow up made

## 2019-11-25 NOTE — Addendum Note (Signed)
Addended by: Freada Bergeron on: 11/25/2019 11:50 AM   Modules accepted: Orders

## 2019-11-26 ENCOUNTER — Encounter (HOSPITAL_COMMUNITY): Payer: Medicare HMO

## 2019-11-26 ENCOUNTER — Other Ambulatory Visit (HOSPITAL_COMMUNITY): Payer: Self-pay

## 2019-11-26 ENCOUNTER — Other Ambulatory Visit (HOSPITAL_COMMUNITY): Payer: Self-pay | Admitting: Internal Medicine

## 2019-11-26 NOTE — Progress Notes (Signed)
Paramedicine Encounter    Patient ID: Danielle [name], female    DOB: May 05, 1946, 74 y.o.   MRN: [id number]   Patient Care Team: Seward Carol, MD as PCP - General (Internal Medicine) Constance Haw, MD as PCP - Electrophysiology (Cardiology) Sueanne Margarita, MD as PCP - Sleep Medicine (Cardiology) Jorge Ny, LCSW as Social Worker (Licensed Clinical Social Worker)  Patient Active Problem List   Diagnosis Date Noted  . Severe nonproliferative diabetic retinopathy of left eye, with macular edema, associated with type 2 diabetes mellitus ([place]) 11/05/2019  . Severe nonproliferative diabetic retinopathy of right eye, with macular edema, associated with type 2 diabetes mellitus ([place]) 11/05/2019  . Retinal hemorrhage of right eye 11/05/2019  . Retinal hemorrhage of left eye 11/05/2019  . Type 2 diabetes mellitus with hyperglycemia, with long-term current use of insulin ([place]) 05/14/2019  . Diabetes mellitus ([place]) 05/14/2019  . 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, with long-term current use of insulin ([place]) 05/14/2019  . Type 2 diabetes mellitus with diabetic polyneuropathy, with long-term current use of insulin ([place]) 05/14/2019  . Encephalopathy acute 05/20/2018  . 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 Acute kidney failure ([place]) 05/15/2018  . Primary localized osteoarthritis of right knee 05/13/2018  . (HFpEF) heart failure with preserved ejection fraction ([place]) 12/20/2017  . Obesity (BMI 30-39.9) 11/11/2017  . OSA (obstructive sleep apnea) 02/18/2017  . Essential hypertension   . Diabetes mellitus without complication ([place])   . Cancer ([place])   . Thyroid disease     Current Outpatient Medications:  .  acetaminophen (TYLENOL) 500 MG tablet, Take 500-1,000 mg by mouth every 6 (six) hours as needed (for pain or headaches)., Disp: , Rfl:  .  allopurinol (ZYLOPRIM) 100 MG tablet, Take 1 tablet (100 mg total) by mouth daily., Disp: ,  Rfl:  .  apixaban (ELIQUIS) 5 MG TABS tablet, Take 1 tablet (5 mg total) by mouth 2 (two) times daily., Disp: 60 tablet, Rfl: 1 .  atorvastatin (LIPITOR) 80 MG tablet, Take 1 tablet (80 mg total) by mouth at bedtime., Disp: 30 tablet, Rfl: 1 .  Biotin 10 MG CAPS, Take 10 mg by mouth daily. , Disp: , Rfl:  .  Blood Glucose Monitoring Suppl (ACCU-CHEK AVIVA) device, by Other route. Use as instructed, Disp: , Rfl:  .  calcium citrate (CALCITRATE - DOSED IN MG ELEMENTAL CALCIUM) 950 MG tablet, Take 1 tablet by mouth 2 (two) times daily. , Disp: , Rfl:  .  carvedilol (COREG) 25 MG tablet, Take 1 tablet (25 mg total) by mouth 2 (two) times daily with a meal., Disp: 30 tablet, Rfl: 1 .  CHONDROITIN SULFATE PO, Take 250 mg by mouth daily. , Disp: , Rfl:  .  colchicine 0.6 MG tablet, TAKE 1 TABLET BY MOUTH EVERY OTHER DAY TO PREVENT FLARES, Disp: , Rfl:  .  empagliflozin (JARDIANCE) 10 MG TABS tablet, Take 1 tablet (10 mg total) by mouth daily., Disp: 30 tablet, Rfl: 1 .  ferrous sulfate 325 (65 FE) MG tablet, Take 325 mg by mouth daily with breakfast., Disp: , Rfl:  .  gabapentin (NEURONTIN) 300 MG capsule, Take 300 mg by mouth daily. , Disp: , Rfl:  .  glucose blood test strip, Use as instructed to test blood sugars 3 times daily E11.65  Freestyle lite, Disp: 400 each, Rfl: 3 .  insulin glargine (LANTUS) 100 UNIT/ML injection, Inject 88 Units into the skin daily., Disp: , Rfl:  .  insulin lispro (HUMALOG KWIKPEN) 200 UNIT/ML KwikPen, Inject 28 Units into the skin 3 (three) times daily., Disp: 30 mL, Rfl: 6 .  Insulin Pen Needle 32G X 4 MM MISC, 1 Device by Does not apply route 4 (four) times daily., Disp: 400 each, Rfl: 3 .  isosorbide mononitrate (IMDUR) 30 MG 24 hr tablet, TAKE 1 TABLET BY MOUTH ONCE DAILY, Disp: 30 tablet, Rfl: 1 .  levothyroxine (SYNTHROID, LEVOTHROID) 75 MCG tablet, Take 75 mcg by mouth daily before breakfast., Disp: , Rfl:  .  losartan (COZAAR) 50 MG tablet, TAKE 1 TABLET BY MOUTH  AT BEDTIME, Disp: 30 tablet, Rfl: 1 .  Multiple Vitamins-Minerals (ALIVE ONCE DAILY WOMENS 50+ PO), Take 1 tablet by mouth daily., Disp: , Rfl:  .  nitroGLYCERIN (NITROSTAT) 0.4 MG SL tablet, DISSOLVE ONE TABLET UNDER THE TONGUE EVERY 5 MINUTES AS NEEDED FOR CHEST PAIN., Disp: 25 tablet, Rfl: 3 .  Omega-3 Fatty Acids (FISH OIL PO), Take 1 capsule by mouth daily., Disp: , Rfl:  .  potassium chloride (KLOR-CON) 10 MEQ tablet, Take 2 tablets (20 mEq total) by mouth daily., Disp: 60 tablet, Rfl: 1 .  spironolactone (ALDACTONE) 25 MG tablet, Take 1 tablet (25 mg total) by mouth every other day., Disp: 30 tablet, Rfl: 1 .  torsemide (DEMADEX) 20 MG tablet, Take 2 tablets (40 mg total) by mouth daily., Disp: 60 tablet, Rfl: 3 .  Travoprost, BAK Free, (TRAVATAN) 0.004 % SOLN ophthalmic solution, Place 1 drop into both eyes at bedtime. , Disp: , Rfl:  Allergies  Allergen Reactions  . Other Other (See Comments)    Blood products- Patient won't accept any  . Sulfa Antibiotics Rash  . Uloric [Febuxostat] Rash     Social History   Socioeconomic History  . Marital status: Widowed    Spouse name: Not on file  . Number of children: 3  . Years of education: Not on file  . Highest education level: Some college, no degree  Occupational History  . Occupation: retired  Tobacco Use  . Smoking status: Former Smoker    Packs/day: 1.00    Years: 22.00    Pack years: 22.00    Types: Cigarettes    Quit date: 1983    Years since quitting: 38.5  . Smokeless tobacco: Never Used  Vaping Use  . Vaping Use: Never used  Substance and Sexual Activity  . Alcohol use: Yes    Comment: 05/22/2018 "glass of wine couple times/wk"  . Drug use: Not Currently  . Sexual activity: Not Currently    Birth control/protection: None, Post-menopausal  Other Topics Concern  . Not on file  Social History Narrative  . Not on file   Social Determinants of Health   Financial Resource Strain:   . Difficulty of Paying  Living Expenses:   Food Insecurity:   . Worried About Charity fundraiser in the Last Year:   . Arboriculturist in the Last Year:   Transportation Needs:   . Film/video editor (Medical):   Marland Kitchen Lack of Transportation (Non-Medical):   Physical Activity:   . Days of Exercise per Week:   . Minutes of Exercise per Session:   Stress:   . Feeling of Stress :   Social Connections:   . Frequency of Communication with Friends and Family:   . Frequency of Social Gatherings with Friends and Family:   . Attends Religious Services:   . Active Member of Clubs or Organizations:   .  Attends Archivist Meetings:   Marland Kitchen Marital Status:   Intimate Partner Violence:   . Fear of Current or Ex-Partner:   . Emotionally Abused:   Marland Kitchen Physically Abused:   . Sexually Abused:     Physical Exam Vitals reviewed.  Constitutional:      Appearance: She is normal weight.  HENT:     Head: Normocephalic.     Nose: Nose normal.     Mouth/Throat:     Mouth: Mucous membranes are moist.  Eyes:     Pupils: Pupils are equal, round, and reactive to light.  Cardiovascular:     Rate and Rhythm: Normal rate and regular rhythm.     Pulses: Normal pulses.     Heart sounds: Normal heart sounds.  Pulmonary:     Effort: Pulmonary effort is normal.     Breath sounds: Normal breath sounds.  Abdominal:     General: Abdomen is flat.     Palpations: Abdomen is soft.  Musculoskeletal:        General: Normal range of motion.     Cervical back: Normal range of motion.     Right lower leg: Edema present.     Left lower leg: Edema present.  Skin:    Capillary Refill: Capillary refill takes less than 2 seconds.  Neurological:     Mental Status: She is alert. Mental status is at baseline.     Arrived for home visit for [facility] who was ambulating around her home alert and oriented reporting she had some lower midline back pain which is chronic for her and that she received injections yesterday for same but they  aren't helping. I assisted patient in putting icy hot on her back and instructed her to take some Tylenol and rest. She agreed with same. Vitals were obtained and are as noted. Medications were reviewed and confirmed. Pill box filled accordingly. Danielle [name] agreed for home vsit in one week. Home visit complete.   CBG- 100 Weight- 230lbs   Refills:  Spironolactone  Eliquis Ferrous Sulfate      Future Appointments  Date Time Provider [place]  11/27/2019 12:50 PM [facility] 1 [facility]  12/01/2019  7:20 AM [facility] DEVICE REMOTES [facility]  12/01/2019  9:30 AM Rankin, Clent Demark, MD [facility] None  01/18/2020 10:00 AM [facility] PA/NP [facility] None  03/01/2020  7:20 AM [facility] DEVICE REMOTES [facility]  04/15/2020 10:10 AM Shamleffer, Melanie Crazier, MD [facility] None     ACTION: Home visit completed Next visit planned for one week

## 2019-11-27 ENCOUNTER — Other Ambulatory Visit: Payer: Medicare HMO

## 2019-12-01 ENCOUNTER — Ambulatory Visit (INDEPENDENT_AMBULATORY_CARE_PROVIDER_SITE_OTHER): Payer: Medicare HMO | Admitting: *Deleted

## 2019-12-01 ENCOUNTER — Other Ambulatory Visit: Payer: Self-pay

## 2019-12-01 ENCOUNTER — Encounter (INDEPENDENT_AMBULATORY_CARE_PROVIDER_SITE_OTHER): Payer: Medicare HMO | Admitting: Ophthalmology

## 2019-12-01 ENCOUNTER — Encounter (INDEPENDENT_AMBULATORY_CARE_PROVIDER_SITE_OTHER): Payer: Self-pay | Admitting: Ophthalmology

## 2019-12-01 ENCOUNTER — Ambulatory Visit (INDEPENDENT_AMBULATORY_CARE_PROVIDER_SITE_OTHER): Payer: Medicare HMO | Admitting: Ophthalmology

## 2019-12-01 DIAGNOSIS — I495 Sick sinus syndrome: Secondary | ICD-10-CM

## 2019-12-01 DIAGNOSIS — E113412 Type 2 diabetes mellitus with severe nonproliferative diabetic retinopathy with macular edema, left eye: Secondary | ICD-10-CM

## 2019-12-01 NOTE — Progress Notes (Signed)
12/01/2019     CHIEF COMPLAINT Patient presents for Retina Follow Up   HISTORY OF PRESENT ILLNESS: Danielle [name] is a 74 y.o. female who presents to the clinic today for:   HPI    Retina Follow Up    Patient presents with  Diabetic Retinopathy.  In left eye.  Duration of 4 weeks.  Since onset it is stable.          Comments    4 week follow up - OCT OU, Focal OS Patient denies change in vision and overall has no complaints. LBS 107 /// A1C 8.0        Last edited by Gerda Diss on 12/01/2019 10:59 AM. (History)      Referring physician: Seward Carol, MD [address] [place],  [place] [address]  HISTORICAL INFORMATION:   Selected notes from the MEDICAL RECORD NUMBER    Lab Results  Component Value Date   HGBA1C 8.8 (A) 10/19/2019     CURRENT MEDICATIONS: Current Outpatient Medications (Ophthalmic Drugs)  Medication Sig  . Travoprost, BAK Free, (TRAVATAN) 0.004 % SOLN ophthalmic solution Place 1 drop into both eyes at bedtime.    No current facility-administered medications for this visit. (Ophthalmic Drugs)   Current Outpatient Medications (Other)  Medication Sig  . acetaminophen (TYLENOL) 500 MG tablet Take 500-1,000 mg by mouth every 6 (six) hours as needed (for pain or headaches).  Marland Kitchen allopurinol (ZYLOPRIM) 100 MG tablet Take 1 tablet (100 mg total) by mouth daily. (Patient not taking: Reported on 11/26/2019)  . apixaban (ELIQUIS) 5 MG TABS tablet Take 1 tablet (5 mg total) by mouth 2 (two) times daily.  Marland Kitchen atorvastatin (LIPITOR) 80 MG tablet Take 1 tablet (80 mg total) by mouth at bedtime.  . Biotin 10 MG CAPS Take 10 mg by mouth daily.   . Blood Glucose Monitoring Suppl (ACCU-CHEK AVIVA) device by Other route. Use as instructed  . calcium citrate (CALCITRATE - DOSED IN MG ELEMENTAL CALCIUM) 950 MG tablet Take 1 tablet by mouth 2 (two) times daily.  (Patient not taking: Reported on 11/26/2019)  . carvedilol (COREG) 25 MG tablet Take 1 tablet (25 mg  total) by mouth 2 (two) times daily with a meal.  . CHONDROITIN SULFATE PO Take 250 mg by mouth daily.  (Patient not taking: Reported on 11/26/2019)  . colchicine 0.6 MG tablet TAKE 1 TABLET BY MOUTH EVERY OTHER DAY TO PREVENT FLARES (Patient not taking: Reported on 11/26/2019)  . empagliflozin (JARDIANCE) 10 MG TABS tablet Take 1 tablet (10 mg total) by mouth daily.  . ferrous sulfate 325 (65 FE) MG tablet Take 325 mg by mouth daily with breakfast.  . gabapentin (NEURONTIN) 300 MG capsule Take 300 mg by mouth daily.   Marland Kitchen glucose blood test strip Use as instructed to test blood sugars 3 times daily E11.65  Freestyle lite  . insulin glargine (LANTUS) 100 UNIT/ML injection Inject 88 Units into the skin daily.  . insulin lispro (HUMALOG KWIKPEN) 200 UNIT/ML KwikPen Inject 28 Units into the skin 3 (three) times daily.  . Insulin Pen Needle 32G X 4 MM MISC 1 Device by Does not apply route 4 (four) times daily.  . isosorbide mononitrate (IMDUR) 30 MG 24 hr tablet TAKE 1 TABLET BY MOUTH ONCE DAILY  . levothyroxine (SYNTHROID, LEVOTHROID) 75 MCG tablet Take 75 mcg by mouth daily before breakfast.  . losartan (COZAAR) 50 MG tablet TAKE 1 TABLET BY MOUTH AT BEDTIME  . Multiple Vitamins-Minerals (  ALIVE ONCE DAILY WOMENS 50+ PO) Take 1 tablet by mouth daily.  . nitroGLYCERIN (NITROSTAT) 0.4 MG SL tablet DISSOLVE ONE TABLET UNDER THE TONGUE EVERY 5 MINUTES AS NEEDED FOR CHEST PAIN. (Patient not taking: Reported on 11/26/2019)  . Omega-3 Fatty Acids (FISH OIL PO) Take 1 capsule by mouth daily.  . potassium chloride (KLOR-CON) 10 MEQ tablet Take 2 tablets (20 mEq total) by mouth daily.  Marland Kitchen spironolactone (ALDACTONE) 25 MG tablet TAKE 1 TABLET BY MOUTH EVERY OTHER DAY(NEEDS APPT FOR FURTHER REFILLS)  . torsemide (DEMADEX) 20 MG tablet Take 2 tablets (40 mg total) by mouth daily.   No current facility-administered medications for this visit. (Other)      REVIEW OF SYSTEMS:    ALLERGIES Allergies  Allergen  Reactions  . Other Other (See Comments)    Blood products- Patient won't accept any  . Sulfa Antibiotics Rash  . Uloric [Febuxostat] Rash    PAST MEDICAL HISTORY Past Medical History:  Diagnosis Date  . Acute combined systolic and diastolic congestive heart failure ([place]) 02/05/2017  . 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 Acute kidney failure ([place]) 05/15/2018  . Arthritis    "all over" (05/22/2018)  . Blood dyscrasia    per pt-has small blood cells-appears as if anemic  . Breast cancer, left breast ([place]) 1985  . CHF (congestive heart failure) ([place])   . Complication of anesthesia    difficult to awaken from per pt  . Coronary artery disease   . Dyspnea   . Essential hypertension   . Heart disease   . Heart murmur   . History of gout   . Hypothyroidism   . Obesity (BMI 30-39.9) 11/11/2017  . OSA (obstructive sleep apnea) 02/18/2017    severe obstructive sleep apnea with an AHI of 75.6/h and mild central sleep apnea with a CAI of 7.7/h.  Oxygen saturations dropped as low as 82%.   He is on CPAP at 9 cm H2O.  . OSA on CPAP   . Personal history of chemotherapy   . Personal history of radiation therapy   . Presence of permanent cardiac pacemaker   . Primary localized osteoarthritis of right knee 05/13/2018  . Refusal of blood transfusions as patient is Jehovah's Witness   . Type II diabetes mellitus ([place])    Past Surgical History:  Procedure Laterality Date  . BREAST BIOPSY Left 1985  . BREAST LUMPECTOMY WITH NEEDLE LOCALIZATION AND AXILLARY LYMPH NODE DISSECTION  1985  . CATARACT EXTRACTION W/ INTRAOCULAR LENS  IMPLANT, BILATERAL Bilateral   . CORONARY ANGIOPLASTY WITH STENT PLACEMENT  2010, 2012,2017    2 done 2010 and 1 replaced 2012 and 2 replaced in 2017  . DILATION AND CURETTAGE OF UTERUS    . INSERT / REPLACE / REMOVE PACEMAKER  07/19/2014  . JOINT REPLACEMENT    . REPLACEMENT TOTAL KNEE Left 09/2012  . THYROIDECTOMY, PARTIAL  1978  .  TONSILLECTOMY    . TOTAL KNEE ARTHROPLASTY Right 05/13/2018   Procedure: TOTAL KNEE ARTHROPLASTY;  Surgeon: Marchia Bond, MD;  Location: [facility];  Service: Orthopedics;  Laterality: Right;  . TUBAL LIGATION      FAMILY HISTORY Family History  Problem Relation Age of Onset  . Multiple myeloma Mother   . Heart disease Father   . Other Sister   . Heart disease Brother   . Diabetes Sister   . Other Sister     SOCIAL HISTORY Social History  Tobacco Use  . Smoking status: Former Smoker    Packs/day: 1.00    Years: 22.00    Pack years: 22.00    Types: Cigarettes    Quit date: 1983    Years since quitting: 38.5  . Smokeless tobacco: Never Used  Vaping Use  . Vaping Use: Never used  Substance Use Topics  . Alcohol use: Yes    Comment: 05/22/2018 "glass of wine couple times/wk"  . Drug use: Not Currently         OPHTHALMIC EXAM:  Base Eye Exam    Visual Acuity (Snellen - Linear)      Right Left   Dist cc 20/30-2 20/50+2   Dist ph cc  20/40-2       Tonometry (Tonopen, 11:03 AM)      Right Left   Pressure 14 14       Pupils      Pupils Dark Light Shape React APD   Right PERRL 3 2 Round Brisk None   Left PERRL 3 2 Round Brisk None       Visual Fields (Counting fingers)      Left Right    Full Full       Extraocular Movement      Right Left    Full Full       Neuro/Psych    Oriented x3: Yes   Mood/Affect: Normal       Dilation    Left eye: 1.0% Mydriacyl, 2.5% Phenylephrine @ 11:03 AM        Slit Lamp and Fundus Exam    External Exam      Right Left   External Normal Normal       Slit Lamp Exam      Right Left   Lids/Lashes Normal Normal   Conjunctiva/Sclera White and quiet White and quiet   Cornea Clear Clear   Anterior Chamber Deep and quiet Deep and quiet   Iris Round and reactive Round and reactive   Lens Posterior chamber intraocular lens Posterior chamber intraocular lens   Vitreous Normal Normal          IMAGING AND  PROCEDURES  Imaging and Procedures for 12/01/19  OCT, Retina - OU - Both Eyes       Left Eye Central Foveal Thickness: 269. Progression has improved.   Notes CSME OS has improved status post intravitreal Avastin.  We will deliver focal laser photocoagulation to the left eye today for more permanent results       Focal Laser - OS - Left Eye       Time Out Confirmed correct patient, procedure, site, and patient consented.   Anesthesia Topical anesthesia was used. Anesthetic medications included Proparacaine 0.5%.   Laser Information The type of laser was diode. Color was yellow. The duration in seconds was 0.06. The spot size was 100 microns. Laser power was 80. Total spots was 131.   Post-op The patient tolerated the procedure well. There were no complications. The patient received written and verbal post procedure care education.                 ASSESSMENT/PLAN:  No problem-specific Assessment & Plan notes found for this encounter.      ICD-10-CM   1. Severe nonproliferative diabetic retinopathy of left eye, with macular edema, associated with type 2 diabetes mellitus (HCC)  Y19.5093 OCT, Retina - OU - Both Eyes    Focal Laser - OS - Left Eye  1.CSME OS has improved status post intravitreal Avastin.  We will deliver focal laser photocoagulation to the left eye today for more permanent results  2.  3.  Ophthalmic Meds Ordered this visit:  No orders of the defined types were placed in this encounter.      Return in about 4 months (around 04/02/2020) for DILATE OU, OCT.  There are no Patient Instructions on file for this visit.   Explained the diagnoses, plan, and follow up with the patient and they expressed understanding.  Patient expressed understanding of the importance of proper follow up care.   Clent Demark Nicolo Tomko M.D. Diseases & Surgery of the Retina and Vitreous [facility]ina [facility] Diabetic [place] 12/01/19     Abbreviations: M myopia  (nearsighted); A astigmatism; H hyperopia (farsighted); P presbyopia; Mrx spectacle prescription;  CTL contact lenses; OD right eye; OS left eye; OU both eyes  XT exotropia; ET esotropia; PEK punctate epithelial keratitis; PEE punctate epithelial erosions; DES dry eye syndrome; MGD meibomian gland dysfunction; ATs artificial tears; PFAT's preservative free artificial tears; [place] nuclear sclerotic cataract; PSC posterior subcapsular cataract; ERM epi-retinal membrane; PVD posterior vitreous detachment; RD retinal detachment; DM diabetes mellitus; DR diabetic retinopathy; NPDR non-proliferative diabetic retinopathy; PDR proliferative diabetic retinopathy; CSME clinically significant macular edema; DME diabetic macular edema; dbh dot blot hemorrhages; CWS cotton wool spot; POAG primary open angle glaucoma; C/D cup-to-disc ratio; HVF humphrey visual field; GVF goldmann visual field; OCT optical coherence tomography; IOP intraocular pressure; BRVO Branch retinal vein occlusion; CRVO central retinal vein occlusion; CRAO central retinal artery occlusion; BRAO branch retinal artery occlusion; RT retinal tear; SB scleral buckle; PPV pars plana vitrectomy; VH Vitreous hemorrhage; PRP panretinal laser photocoagulation; IVK intravitreal kenalog; VMT vitreomacular traction; MH Macular hole;  NVD neovascularization of the disc; NVE neovascularization elsewhere; AREDS age related eye disease study; ARMD age related macular degeneration; POAG primary open angle glaucoma; EBMD epithelial/anterior basement membrane dystrophy; ACIOL anterior chamber intraocular lens; IOL intraocular lens; PCIOL posterior chamber intraocular lens; Phaco/IOL phacoemulsification with intraocular lens placement; [place] photorefractive keratectomy; LASIK laser assisted in situ keratomileusis; HTN hypertension; DM diabetes mellitus; COPD chronic obstructive pulmonary disease

## 2019-12-03 ENCOUNTER — Other Ambulatory Visit (HOSPITAL_COMMUNITY): Payer: Self-pay | Admitting: Cardiology

## 2019-12-03 ENCOUNTER — Other Ambulatory Visit (HOSPITAL_COMMUNITY): Payer: Self-pay

## 2019-12-03 LAB — CUP PACEART REMOTE DEVICE CHECK
Battery Remaining Longevity: 73 mo
Battery Voltage: 3 V
Brady Statistic AP VP Percent: 0.02 %
Brady Statistic AP VS Percent: 6.27 %
Brady Statistic AS VP Percent: 0.05 %
Brady Statistic AS VS Percent: 93.66 %
Brady Statistic RA Percent Paced: 6.28 %
Brady Statistic RV Percent Paced: 0.09 %
Date Time Interrogation Session: 20210707174733
Implantable Lead Implant Date: 20160222
Implantable Lead Implant Date: 20160222
Implantable Lead Location: 753859
Implantable Lead Location: 753860
Implantable Lead Model: 5076
Implantable Lead Model: 5076
Implantable Pulse Generator Implant Date: 20160222
Lead Channel Impedance Value: 399 Ohm
Lead Channel Impedance Value: 399 Ohm
Lead Channel Impedance Value: 418 Ohm
Lead Channel Impedance Value: 475 Ohm
Lead Channel Pacing Threshold Amplitude: 0.625 V
Lead Channel Pacing Threshold Amplitude: 0.75 V
Lead Channel Pacing Threshold Pulse Width: 0.4 ms
Lead Channel Pacing Threshold Pulse Width: 0.4 ms
Lead Channel Sensing Intrinsic Amplitude: 17.125 mV
Lead Channel Sensing Intrinsic Amplitude: 17.125 mV
Lead Channel Sensing Intrinsic Amplitude: 4.625 mV
Lead Channel Sensing Intrinsic Amplitude: 4.625 mV
Lead Channel Setting Pacing Amplitude: 2 V
Lead Channel Setting Pacing Amplitude: 2 V
Lead Channel Setting Pacing Pulse Width: 0.4 ms
Lead Channel Setting Sensing Sensitivity: 2 mV

## 2019-12-03 MED ORDER — TORSEMIDE 20 MG PO TABS: 40.0000 mg | ORAL_TABLET | Freq: Every day | ORAL | 3 refills | Status: DC

## 2019-12-03 MED ORDER — APIXABAN 5 MG PO TABS: 5.0000 mg | ORAL_TABLET | Freq: Two times a day (BID) | ORAL | 3 refills | Status: DC

## 2019-12-03 MED ORDER — SPIRONOLACTONE 25 MG PO TABS: 25.0000 mg | ORAL_TABLET | Freq: Every day | ORAL | 3 refills | Status: DC

## 2019-12-03 NOTE — Progress Notes (Signed)
Remote pacemaker transmission.   

## 2019-12-03 NOTE — Progress Notes (Signed)
Paramedicine Encounter    Patient ID: Danielle [name], female    DOB: 05/11/1946, 74 y.o.   MRN: [id number]   Patient Care Team: Seward Carol, MD as PCP - General (Internal Medicine) Constance Haw, MD as PCP - Electrophysiology (Cardiology) Sueanne Margarita, MD as PCP - Sleep Medicine (Cardiology) Jorge Ny, LCSW as Social Worker (Licensed Clinical Social Worker)  Patient Active Problem List   Diagnosis Date Noted  . Severe nonproliferative diabetic retinopathy of left eye, with macular edema, associated with type 2 diabetes mellitus ([place]) 11/05/2019  . Severe nonproliferative diabetic retinopathy of right eye, with macular edema, associated with type 2 diabetes mellitus ([place]) 11/05/2019  . Retinal hemorrhage of right eye 11/05/2019  . Retinal hemorrhage of left eye 11/05/2019  . Type 2 diabetes mellitus with hyperglycemia, with long-term current use of insulin ([place]) 05/14/2019  . Diabetes mellitus ([place]) 05/14/2019  . 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, with long-term current use of insulin ([place]) 05/14/2019  . Type 2 diabetes mellitus with diabetic polyneuropathy, with long-term current use of insulin ([place]) 05/14/2019  . Encephalopathy acute 05/20/2018  . 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 Acute kidney failure ([place]) 05/15/2018  . Primary localized osteoarthritis of right knee 05/13/2018  . (HFpEF) heart failure with preserved ejection fraction ([place]) 12/20/2017  . Obesity (BMI 30-39.9) 11/11/2017  . OSA (obstructive sleep apnea) 02/18/2017  . Essential hypertension   . Diabetes mellitus without complication ([place])   . Cancer ([place])   . Thyroid disease     Current Outpatient Medications:  .  acetaminophen (TYLENOL) 500 MG tablet, Take 500-1,000 mg by mouth every 6 (six) hours as needed (for pain or headaches)., Disp: , Rfl:  .  allopurinol (ZYLOPRIM) 100 MG tablet, Take 1 tablet (100 mg total) by mouth daily. (Patient  not taking: Reported on 11/26/2019), Disp: , Rfl:  .  apixaban (ELIQUIS) 5 MG TABS tablet, Take 1 tablet (5 mg total) by mouth 2 (two) times daily., Disp: 60 tablet, Rfl: 1 .  atorvastatin (LIPITOR) 80 MG tablet, Take 1 tablet (80 mg total) by mouth at bedtime., Disp: 30 tablet, Rfl: 1 .  Biotin 10 MG CAPS, Take 10 mg by mouth daily. , Disp: , Rfl:  .  Blood Glucose Monitoring Suppl (ACCU-CHEK AVIVA) device, by Other route. Use as instructed, Disp: , Rfl:  .  calcium citrate (CALCITRATE - DOSED IN MG ELEMENTAL CALCIUM) 950 MG tablet, Take 1 tablet by mouth 2 (two) times daily.  (Patient not taking: Reported on 11/26/2019), Disp: , Rfl:  .  carvedilol (COREG) 25 MG tablet, Take 1 tablet (25 mg total) by mouth 2 (two) times daily with a meal., Disp: 30 tablet, Rfl: 1 .  CHONDROITIN SULFATE PO, Take 250 mg by mouth daily.  (Patient not taking: Reported on 11/26/2019), Disp: , Rfl:  .  colchicine 0.6 MG tablet, TAKE 1 TABLET BY MOUTH EVERY OTHER DAY TO PREVENT FLARES (Patient not taking: Reported on 11/26/2019), Disp: , Rfl:  .  empagliflozin (JARDIANCE) 10 MG TABS tablet, Take 1 tablet (10 mg total) by mouth daily., Disp: 30 tablet, Rfl: 1 .  ferrous sulfate 325 (65 FE) MG tablet, Take 325 mg by mouth daily with breakfast., Disp: , Rfl:  .  gabapentin (NEURONTIN) 300 MG capsule, Take 300 mg by mouth daily. , Disp: , Rfl:  .  glucose blood test strip, Use as instructed to test blood sugars 3 times daily E11.65  Freestyle  lite, Disp: 400 each, Rfl: 3 .  insulin glargine (LANTUS) 100 UNIT/ML injection, Inject 88 Units into the skin daily., Disp: , Rfl:  .  insulin lispro (HUMALOG KWIKPEN) 200 UNIT/ML KwikPen, Inject 28 Units into the skin 3 (three) times daily., Disp: 30 mL, Rfl: 6 .  Insulin Pen Needle 32G X 4 MM MISC, 1 Device by Does not apply route 4 (four) times daily., Disp: 400 each, Rfl: 3 .  isosorbide mononitrate (IMDUR) 30 MG 24 hr tablet, TAKE 1 TABLET BY MOUTH ONCE DAILY, Disp: 30 tablet, Rfl: 1 .   levothyroxine (SYNTHROID, LEVOTHROID) 75 MCG tablet, Take 75 mcg by mouth daily before breakfast., Disp: , Rfl:  .  losartan (COZAAR) 50 MG tablet, TAKE 1 TABLET BY MOUTH AT BEDTIME, Disp: 30 tablet, Rfl: 1 .  Multiple Vitamins-Minerals (ALIVE ONCE DAILY WOMENS 50+ PO), Take 1 tablet by mouth daily., Disp: , Rfl:  .  nitroGLYCERIN (NITROSTAT) 0.4 MG SL tablet, DISSOLVE ONE TABLET UNDER THE TONGUE EVERY 5 MINUTES AS NEEDED FOR CHEST PAIN. (Patient not taking: Reported on 11/26/2019), Disp: 25 tablet, Rfl: 3 .  Omega-3 Fatty Acids (FISH OIL PO), Take 1 capsule by mouth daily., Disp: , Rfl:  .  potassium chloride (KLOR-CON) 10 MEQ tablet, Take 2 tablets (20 mEq total) by mouth daily., Disp: 60 tablet, Rfl: 1 .  spironolactone (ALDACTONE) 25 MG tablet, TAKE 1 TABLET BY MOUTH EVERY OTHER DAY(NEEDS APPT FOR FURTHER REFILLS), Disp: 30 tablet, Rfl: 0 .  torsemide (DEMADEX) 20 MG tablet, Take 2 tablets (40 mg total) by mouth daily., Disp: 60 tablet, Rfl: 3 .  Travoprost, BAK Free, (TRAVATAN) 0.004 % SOLN ophthalmic solution, Place 1 drop into both eyes at bedtime. , Disp: , Rfl:  Allergies  Allergen Reactions  . Other Other (See Comments)    Blood products- Patient won't accept any  . Sulfa Antibiotics Rash  . Uloric [Febuxostat] Rash     Social History   Socioeconomic History  . Marital status: Widowed    Spouse name: Not on file  . Number of children: 3  . Years of education: Not on file  . Highest education level: Some college, no degree  Occupational History  . Occupation: retired  Tobacco Use  . Smoking status: Former Smoker    Packs/day: 1.00    Years: 22.00    Pack years: 22.00    Types: Cigarettes    Quit date: 1983    Years since quitting: 38.5  . Smokeless tobacco: Never Used  Vaping Use  . Vaping Use: Never used  Substance and Sexual Activity  . Alcohol use: Yes    Comment: 05/22/2018 "glass of wine couple times/wk"  . Drug use: Not Currently  . Sexual activity: Not  Currently    Birth control/protection: None, Post-menopausal  Other Topics Concern  . Not on file  Social History Narrative  . Not on file   Social Determinants of Health   Financial Resource Strain:   . Difficulty of Paying Living Expenses:   Food Insecurity:   . Worried About Charity fundraiser in the Last Year:   . Arboriculturist in the Last Year:   Transportation Needs:   . Film/video editor (Medical):   Marland Kitchen Lack of Transportation (Non-Medical):   Physical Activity:   . Days of Exercise per Week:   . Minutes of Exercise per Session:   Stress:   . Feeling of Stress :   Social Connections:   . Frequency  of Communication with Friends and Family:   . Frequency of Social Gatherings with Friends and Family:   . Attends Religious Services:   . Active Member of Clubs or Organizations:   . Attends Archivist Meetings:   Marland Kitchen Marital Status:   Intimate Partner Violence:   . Fear of Current or Ex-Partner:   . Emotionally Abused:   Marland Kitchen Physically Abused:   . Sexually Abused:     Physical Exam Constitutional:      Appearance: She is normal weight.  HENT:     Head: Normocephalic.     Nose: Nose normal.     Mouth/Throat:     Mouth: Mucous membranes are moist.  Eyes:     Pupils: Pupils are equal, round, and reactive to light.  Cardiovascular:     Rate and Rhythm: Normal rate and regular rhythm.     Pulses: Normal pulses.     Heart sounds: Normal heart sounds.  Pulmonary:     Effort: Pulmonary effort is normal.     Breath sounds: Normal breath sounds.  Abdominal:     General: Abdomen is flat.     Palpations: Abdomen is soft.  Musculoskeletal:        General: Swelling present. Normal range of motion.     Cervical back: Normal range of motion.     Right lower leg: Edema present.     Left lower leg: Edema present.  Skin:    General: Skin is warm and dry.     Capillary Refill: Capillary refill takes less than 2 seconds.  Neurological:     Mental Status: She  is alert. Mental status is at baseline.  Psychiatric:        Mood and Affect: Mood normal.     Arrived for home visit for [place] who was alert and oriented ambulating around her home. Reata informed me thather sugar has been dropping in the night and I informed her to make sure she is eating when taking her insulin and if her sugar drops dangerously low she needs to seek further medical attention. Atonya understood. I will be messaging Endocrinology to make them aware. Vitals were obtained. Patient had not yet had her medications. CBG- [id number]  Medications were reviewed and confirmed. 2 pill boxes filled.   Missing: -Eliquis #2 WEDS PM through SAT PM -Allupurinol #1 and #2 (whole box) -Spironolactone #1 and #2 (whole box) -Colchicine #1 and #2 (whole box)  Dalia did have some swelling in her legs and pain in her hands and feet. She believes her Gout is flaring up. She has been without allupurinol for 2 weeks. I called Dr. Bernell List office and made CMA aware of same and need for refills for Allupurinol and Colchicine.  Home visit complete I will see Seri in two weeks.     Future Appointments  Date Time Provider Conrad  12/04/2019  3:10 PM [facility] 1 [facility]  01/18/2020 10:00 AM [facility] PA/NP [facility] None  03/01/2020  7:20 AM [facility] DEVICE REMOTES [facility]  04/04/2020 10:30 AM Rankin, Clent Demark, MD [facility] None  04/15/2020 10:10 AM Shamleffer, Melanie Crazier, MD [facility] None     ACTION: Home visit completed Next visit planned for two weeks

## 2019-12-04 ENCOUNTER — Other Ambulatory Visit (HOSPITAL_COMMUNITY): Payer: Self-pay

## 2019-12-04 ENCOUNTER — Encounter: Payer: Self-pay | Admitting: Internal Medicine

## 2019-12-04 ENCOUNTER — Telehealth: Payer: Self-pay | Admitting: Internal Medicine

## 2019-12-04 ENCOUNTER — Ambulatory Visit
Admission: RE | Admit: 2019-12-04 | Discharge: 2019-12-04 | Disposition: A | Discharge status: Home / Self Care | Payer: Medicare HMO | Source: Ambulatory Visit | Attending: Internal Medicine | Admitting: Internal Medicine

## 2019-12-04 DIAGNOSIS — M25559 Pain in unspecified hip: Secondary | ICD-10-CM

## 2019-12-04 DIAGNOSIS — R1030 Lower abdominal pain, unspecified: Secondary | ICD-10-CM

## 2019-12-04 MED ORDER — FERROUS SULFATE 325 (65 FE) MG PO TABS: 325.0000 mg | ORAL_TABLET | Freq: Every day | ORAL | 3 refills | Status: DC

## 2019-12-04 NOTE — Telephone Encounter (Signed)
Attempted to call the pt as well on 12/04/2019 at 1205 but no answer and no way to leave a message.     Abby Nena Jordan, MD  [facility] Endocrinology  [facility] [address] [place], [place] [address] Phone: [phone] FAX: [phone]

## 2019-12-04 NOTE — Telephone Encounter (Signed)
-----   Message from Jeannetta Nap, [place] sent at 12/04/2019 10:37 AM EDT ----- Regarding: RE: Patient Concern Lft vm for Danielle [name] with dosage change. Attempted to reach pt but she does not have a vm set up. ----- Message ----- From: Cloyd Stagers, MD Sent: 12/03/2019   7:07 PM EDT To: Jeannetta Nap, CMA Subject: FW: Patient Concern                            [name],  Please call her and ask her to reduce her lantus to 75 units    Thanks  ----- Message ----- From: Danielle [name], EMT Sent: 12/03/2019  12:40 PM EDT To: Cloyd Stagers, MD Subject: Patient Concern                                Hello Dr. Kelton Pillar, I am Danielle [name]. Community Paramedic who follows [name] weekly at home, I assist with medications and managing her pill boxes as well as appointments and social needs. Over the last two weeks Danielle has had a few drops in her sugar readings as low as 59. This is abnormal for [facility] and she agreed with me in messaging you about same. I did so some education on insulin use and eating proper meals and she has been following this closely. She stated this morning during our visit that last night while sleeping she woke up in a cold sweat and felt as if her sugar was low so she ate some peanut butter and drank orange juice and it corrected it self. I wanted to make you aware of this and please let me know how we can proceed.   I am out of the office until July 19 but can be reached after at [phone] if you need any further information.   Thank you,  Danielle [name]  [employer]

## 2019-12-04 NOTE — Telephone Encounter (Signed)
-----   Message from Danielle [name], EMT sent at 12/03/2019 12:40 PM EDT ----- Regarding: Patient Concern Hello Dr. Kelton Pillar, I am Danielle [name]. Community Paramedic who follows Danielle [name] weekly at home, I assist with medications and managing her pill boxes as well as appointments and social needs. Over the last two weeks Danielle [name] has had a few drops in her sugar readings as low as 59. This is abnormal for [facility] and she agreed with me in messaging you about same. I did so some education on insulin use and eating proper meals and she has been following this closely. She stated this morning during our visit that last night while sleeping she woke up in a cold sweat and felt as if her sugar was low so she ate some peanut butter and drank orange juice and it corrected it self. I wanted to make you aware of this and please let me know how we can proceed.   I am out of the office until July 19 but can be reached after at [phone] if you need any further information.   Thank you,  Danielle [name]  [employer]

## 2019-12-04 NOTE — Telephone Encounter (Signed)
Left a message with [facility] that we are unable to reach Ms. Goostree and maybe someone from their end can reach out to her and ask her to reduce lantus to 75 units daily     Abby Nena Jordan, MD  [facility] Endocrinology  [facility] [address] [place], [place] [address] Phone: [phone] FAX: [phone]

## 2019-12-07 ENCOUNTER — Telehealth: Payer: Self-pay

## 2019-12-07 ENCOUNTER — Telehealth (HOSPITAL_COMMUNITY): Payer: Self-pay | Admitting: *Deleted

## 2019-12-07 NOTE — Telephone Encounter (Signed)
Pt left vm stating she was supposed to be referred to an exercise program. I see in her chart she was referred to the [email or web address] program. Message sent to Landis Martins to follow up per Adline Potter

## 2019-12-07 NOTE — Telephone Encounter (Signed)
Asked to retry pt reference PREP referral.  Called patient offered 7/26 class. Sts will be out of town the 1st week of Aug so can't do that one.  Will call her with another start date of class sts she doesn't have any travel plans in aug/sept.  Given my number again to call if need be.

## 2019-12-07 NOTE — Telephone Encounter (Signed)
Has been referred and several attempts have been made by the PREP coordinator to contact her. Will reach back out to coordinator and re-initiate the referral.    Landis Martins, MS, ACSM, [facility] Clinical Exercise Physiologist/ Health and Wellness Coach

## 2019-12-17 ENCOUNTER — Other Ambulatory Visit (HOSPITAL_COMMUNITY): Payer: Self-pay

## 2019-12-17 NOTE — Progress Notes (Signed)
Paramedicine Encounter    Patient ID: Danielle [name], female    DOB: [birth year], 74 y.o.   MRN: [id number]   Patient Care Team: Seward Carol, MD as PCP - General (Internal Medicine) Constance Haw, MD as PCP - Electrophysiology (Cardiology) Sueanne Margarita, MD as PCP - Sleep Medicine (Cardiology) Jorge Ny, LCSW as Social Worker (Licensed Clinical Social Worker)  Patient Active Problem List   Diagnosis Date Noted  . Severe nonproliferative diabetic retinopathy of left eye, with macular edema, associated with type 2 diabetes mellitus ([place]) 11/05/2019  . Severe nonproliferative diabetic retinopathy of right eye, with macular edema, associated with type 2 diabetes mellitus ([place]) 11/05/2019  . Retinal hemorrhage of right eye 11/05/2019  . Retinal hemorrhage of left eye 11/05/2019  . Type 2 diabetes mellitus with hyperglycemia, with long-term current use of insulin ([place]) 05/14/2019  . Diabetes mellitus ([place]) 05/14/2019  . 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 Type 2 diabetes mellitus with diabetic polyneuropathy, with long-term current use of insulin ([place]) 05/14/2019  . Encephalopathy acute 05/20/2018  . 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 Acute kidney failure ([place]) 05/15/2018  . Primary localized osteoarthritis of right knee 05/13/2018  . (HFpEF) heart failure with preserved ejection fraction ([place]) 12/20/2017  . Obesity (BMI 30-39.9) 11/11/2017  . OSA (obstructive sleep apnea) 02/18/2017  . Essential hypertension   . Diabetes mellitus without complication ([place])   . Cancer ([place])   . Thyroid disease     Current Outpatient Medications:  .  acetaminophen (TYLENOL) 500 MG tablet, Take 500-1,000 mg by mouth every 6 (six) hours as needed (for pain or headaches)., Disp: , Rfl:  .  allopurinol (ZYLOPRIM) 100 MG tablet, Take 1 tablet (100 mg total) by mouth daily. (Patient  not taking: Reported on 11/26/2019), Disp: , Rfl:  .  apixaban (ELIQUIS) 5 MG TABS tablet, Take 1 tablet (5 mg total) by mouth 2 (two) times daily., Disp: 60 tablet, Rfl: 3 .  atorvastatin (LIPITOR) 80 MG tablet, Take 1 tablet (80 mg total) by mouth at bedtime., Disp: 30 tablet, Rfl: 1 .  Biotin 10 MG CAPS, Take 10 mg by mouth daily. , Disp: , Rfl:  .  Blood Glucose Monitoring Suppl (ACCU-CHEK AVIVA) device, by Other route. Use as instructed, Disp: , Rfl:  .  calcium citrate (CALCITRATE - DOSED IN MG ELEMENTAL CALCIUM) 950 MG tablet, Take 1 tablet by mouth 2 (two) times daily. , Disp: , Rfl:  .  carvedilol (COREG) 25 MG tablet, Take 1 tablet (25 mg total) by mouth 2 (two) times daily with a meal., Disp: 30 tablet, Rfl: 1 .  CHONDROITIN SULFATE PO, Take 250 mg by mouth daily.  (Patient not taking: Reported on 11/26/2019), Disp: , Rfl:  .  colchicine 0.6 MG tablet, TAKE 1 TABLET BY MOUTH EVERY OTHER DAY TO PREVENT FLARES (Patient not taking: Reported on 11/26/2019), Disp: , Rfl:  .  empagliflozin (JARDIANCE) 10 MG TABS tablet, Take 1 tablet (10 mg total) by mouth daily., Disp: 30 tablet, Rfl: 1 .  ferrous sulfate 325 (65 FE) MG tablet, Take 1 tablet (325 mg total) by mouth daily with breakfast., Disp: 90 tablet, Rfl: 3 .  gabapentin (NEURONTIN) 300 MG capsule, Take 300 mg by mouth daily. , Disp: , Rfl:  .  glucose blood test strip, Use as instructed to test blood sugars 3 times daily E11.65  Freestyle lite, Disp:  400 each, Rfl: 3 .  insulin glargine (LANTUS) 100 UNIT/ML injection, Inject 88 Units into the skin daily., Disp: , Rfl:  .  insulin lispro (HUMALOG KWIKPEN) 200 UNIT/ML KwikPen, Inject 28 Units into the skin 3 (three) times daily., Disp: 30 mL, Rfl: 6 .  Insulin Pen Needle 32G X 4 MM MISC, 1 Device by Does not apply route 4 (four) times daily., Disp: 400 each, Rfl: 3 .  isosorbide mononitrate (IMDUR) 30 MG 24 hr tablet, TAKE 1 TABLET BY MOUTH ONCE DAILY, Disp: 30 tablet, Rfl: 1 .  levothyroxine  (SYNTHROID, LEVOTHROID) 75 MCG tablet, Take 75 mcg by mouth daily before breakfast., Disp: , Rfl:  .  losartan (COZAAR) 50 MG tablet, TAKE 1 TABLET BY MOUTH AT BEDTIME, Disp: 30 tablet, Rfl: 1 .  Multiple Vitamins-Minerals (ALIVE ONCE DAILY WOMENS 50+ PO), Take 1 tablet by mouth daily., Disp: , Rfl:  .  nitroGLYCERIN (NITROSTAT) 0.4 MG SL tablet, DISSOLVE ONE TABLET UNDER THE TONGUE EVERY 5 MINUTES AS NEEDED FOR CHEST PAIN. (Patient not taking: Reported on 11/26/2019), Disp: 25 tablet, Rfl: 3 .  Omega-3 Fatty Acids (FISH OIL PO), Take 1 capsule by mouth daily., Disp: , Rfl:  .  potassium chloride (KLOR-CON) 10 MEQ tablet, Take 2 tablets (20 mEq total) by mouth daily., Disp: 60 tablet, Rfl: 1 .  spironolactone (ALDACTONE) 25 MG tablet, Take 1 tablet (25 mg total) by mouth daily., Disp: 30 tablet, Rfl: 3 .  torsemide (DEMADEX) 20 MG tablet, Take 2 tablets (40 mg total) by mouth daily., Disp: 60 tablet, Rfl: 3 .  Travoprost, BAK Free, (TRAVATAN) 0.004 % SOLN ophthalmic solution, Place 1 drop into both eyes at bedtime. , Disp: , Rfl:  Allergies  Allergen Reactions  . Other Other (See Comments)    Blood products- Patient won't accept any  . Sulfa Antibiotics Rash  . Uloric [Febuxostat] Rash     Social History   Socioeconomic History  . Marital status: Widowed    Spouse name: Not on file  . Number of children: 3  . Years of education: Not on file  . Highest education level: Some college, no degree  Occupational History  . Occupation: retired  Tobacco Use  . Smoking status: Former Smoker    Packs/day: 1.00    Years: 22.00    Pack years: 22.00    Types: Cigarettes    Quit date: 1983    Years since quitting: 38.5  . Smokeless tobacco: Never Used  Vaping Use  . Vaping Use: Never used  Substance and Sexual Activity  . Alcohol use: Yes    Comment: 05/22/2018 "glass of wine couple times/wk"  . Drug use: Not Currently  . Sexual activity: Not Currently    Birth control/protection: None,  Post-menopausal  Other Topics Concern  . Not on file  Social History Narrative  . Not on file   Social Determinants of Health   Financial Resource Strain:   . Difficulty of Paying Living Expenses:   Food Insecurity:   . Worried About Charity fundraiser in the Last Year:   . Arboriculturist in the Last Year:   Transportation Needs:   . Film/video editor (Medical):   Marland Kitchen Lack of Transportation (Non-Medical):   Physical Activity:   . Days of Exercise per Week:   . Minutes of Exercise per Session:   Stress:   . Feeling of Stress :   Social Connections:   . Frequency of Communication with Friends and  Family:   . Frequency of Social Gatherings with Friends and Family:   . Attends Religious Services:   . Active Member of Clubs or Organizations:   . Attends Archivist Meetings:   Marland Kitchen Marital Status:   Intimate Partner Violence:   . Fear of Current or Ex-Partner:   . Emotionally Abused:   Marland Kitchen Physically Abused:   . Sexually Abused:     Physical Exam Vitals reviewed.  Constitutional:      Appearance: She is normal weight.  HENT:     Head: Normocephalic.     Nose: Nose normal.     Mouth/Throat:     Mouth: Mucous membranes are moist.  Eyes:     Pupils: Pupils are equal, round, and reactive to light.  Cardiovascular:     Rate and Rhythm: Normal rate.     Pulses: Normal pulses.     Heart sounds: Normal heart sounds.  Pulmonary:     Effort: Pulmonary effort is normal.     Breath sounds: Normal breath sounds.  Abdominal:     General: Abdomen is flat.     Palpations: Abdomen is soft.  Musculoskeletal:        General: Swelling present. Normal range of motion.     Cervical back: Normal range of motion.     Right lower leg: Edema present.     Left lower leg: Edema present.  Skin:    General: Skin is warm and dry.     Capillary Refill: Capillary refill takes less than 2 seconds.  Neurological:     Mental Status: She is alert. Mental status is at baseline.   Psychiatric:        Mood and Affect: Mood normal.     Arrived for home visit for Burr who was seated alert and oriented. Justene reports having issues with constipation and was seen by PCP who did an abdominal scan and noted she was Impacted. It was relayed to the patient to decrease her Iron supplements and suggested to decrease Metamucil intake. Patient was educated on same. Patient stated she was having some midline back pain and denied any recent falls or trauma. Abdomen was soft non tender on assessment. Patient has been compliant with her medications for the last two weeks. Laurita denied shortness of breath, dizziness, chest pain or any other priority symptoms. Astryd's medications reviewed and confirmed. Pill box filled and verified. Arlet was made aware of Lantus decrease from 88 units to 75 units. Bianna verbalized understanding. Charnika reports she has been exercising daily and overall feels better. Home visit complete I will see patient in one week.   Refills: None  CBG- 202  Future Appointments  Date Time Provider [place]  01/18/2020 10:00 AM [facility] PA/NP [facility] None  03/01/2020  7:20 AM [facility] DEVICE REMOTES [facility]  04/04/2020 10:30 AM Rankin, Clent Demark, MD [facility] None  04/15/2020 10:10 AM Shamleffer, Melanie Crazier, MD [facility] None     ACTION: Home visit completed Next visit planned for one week

## 2019-12-24 ENCOUNTER — Other Ambulatory Visit (HOSPITAL_COMMUNITY): Payer: Self-pay

## 2019-12-24 NOTE — Progress Notes (Signed)
Paramedicine Encounter    Patient ID: Danielle [name], female    DOB: 1946/04/07, 74 y.o.   MRN: [id number]   Patient Care Team: Seward Carol, MD as PCP - General (Internal Medicine) Constance Haw, MD as PCP - Electrophysiology (Cardiology) Sueanne Margarita, MD as PCP - Sleep Medicine (Cardiology) Jorge Ny, LCSW as Social Worker (Licensed Clinical Social Worker)  Patient Active Problem List   Diagnosis Date Noted  . Severe nonproliferative diabetic retinopathy of left eye, with macular edema, associated with type 2 diabetes mellitus ([place]) 11/05/2019  . Severe nonproliferative diabetic retinopathy of right eye, with macular edema, associated with type 2 diabetes mellitus ([place]) 11/05/2019  . Retinal hemorrhage of right eye 11/05/2019  . Retinal hemorrhage of left eye 11/05/2019  . Type 2 diabetes mellitus with hyperglycemia, with long-term current use of insulin ([place]) 05/14/2019  . Diabetes mellitus ([place]) 05/14/2019  . 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 Type 2 diabetes mellitus with diabetic polyneuropathy, with long-term current use of insulin ([place]) 05/14/2019  . Encephalopathy acute 05/20/2018  . 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 Acute kidney failure ([place]) 05/15/2018  . Primary localized osteoarthritis of right knee 05/13/2018  . (HFpEF) heart failure with preserved ejection fraction ([place]) 12/20/2017  . Obesity (BMI 30-39.9) 11/11/2017  . OSA (obstructive sleep apnea) 02/18/2017  . Essential hypertension   . Diabetes mellitus without complication ([place])   . Cancer ([place])   . Thyroid disease     Current Outpatient Medications:  .  acetaminophen (TYLENOL) 500 MG tablet, Take 500-1,000 mg by mouth every 6 (six) hours as needed (for pain or headaches)., Disp: , Rfl:  .  allopurinol (ZYLOPRIM) 100 MG tablet, Take 1 tablet (100 mg total) by mouth daily., Disp: ,  Rfl:  .  apixaban (ELIQUIS) 5 MG TABS tablet, Take 1 tablet (5 mg total) by mouth 2 (two) times daily., Disp: 60 tablet, Rfl: 3 .  atorvastatin (LIPITOR) 80 MG tablet, Take 1 tablet (80 mg total) by mouth at bedtime., Disp: 30 tablet, Rfl: 1 .  Biotin 10 MG CAPS, Take 10 mg by mouth daily.  (Patient not taking: Reported on 12/17/2019), Disp: , Rfl:  .  Blood Glucose Monitoring Suppl (ACCU-CHEK AVIVA) device, by Other route. Use as instructed, Disp: , Rfl:  .  calcium citrate (CALCITRATE - DOSED IN MG ELEMENTAL CALCIUM) 950 MG tablet, Take 1 tablet by mouth 2 (two) times daily.  (Patient not taking: Reported on 12/17/2019), Disp: , Rfl:  .  carvedilol (COREG) 25 MG tablet, Take 1 tablet (25 mg total) by mouth 2 (two) times daily with a meal., Disp: 30 tablet, Rfl: 1 .  CHONDROITIN SULFATE PO, Take 250 mg by mouth daily.  (Patient not taking: Reported on 11/26/2019), Disp: , Rfl:  .  colchicine 0.6 MG tablet, TAKE 1 TABLET BY MOUTH EVERY OTHER DAY TO PREVENT FLARES, Disp: , Rfl:  .  empagliflozin (JARDIANCE) 10 MG TABS tablet, Take 1 tablet (10 mg total) by mouth daily., Disp: 30 tablet, Rfl: 1 .  ferrous sulfate 325 (65 FE) MG tablet, Take 1 tablet (325 mg total) by mouth daily with breakfast., Disp: 90 tablet, Rfl: 3 .  gabapentin (NEURONTIN) 300 MG capsule, Take 300 mg by mouth daily. , Disp: , Rfl:  .  glucose blood test strip, Use as instructed to test blood sugars 3 times daily E11.65  Freestyle lite, Disp:  400 each, Rfl: 3 .  insulin glargine (LANTUS) 100 UNIT/ML injection, Inject 88 Units into the skin daily., Disp: , Rfl:  .  insulin lispro (HUMALOG KWIKPEN) 200 UNIT/ML KwikPen, Inject 28 Units into the skin 3 (three) times daily., Disp: 30 mL, Rfl: 6 .  Insulin Pen Needle 32G X 4 MM MISC, 1 Device by Does not apply route 4 (four) times daily., Disp: 400 each, Rfl: 3 .  isosorbide mononitrate (IMDUR) 30 MG 24 hr tablet, TAKE 1 TABLET BY MOUTH ONCE DAILY, Disp: 30 tablet, Rfl: 1 .  levothyroxine  (SYNTHROID, LEVOTHROID) 75 MCG tablet, Take 75 mcg by mouth daily before breakfast., Disp: , Rfl:  .  losartan (COZAAR) 50 MG tablet, TAKE 1 TABLET BY MOUTH AT BEDTIME, Disp: 30 tablet, Rfl: 1 .  Multiple Vitamins-Minerals (ALIVE ONCE DAILY WOMENS 50+ PO), Take 1 tablet by mouth daily., Disp: , Rfl:  .  nitroGLYCERIN (NITROSTAT) 0.4 MG SL tablet, DISSOLVE ONE TABLET UNDER THE TONGUE EVERY 5 MINUTES AS NEEDED FOR CHEST PAIN. (Patient not taking: Reported on 11/26/2019), Disp: 25 tablet, Rfl: 3 .  Omega-3 Fatty Acids (FISH OIL PO), Take 1 capsule by mouth daily., Disp: , Rfl:  .  potassium chloride (KLOR-CON) 10 MEQ tablet, Take 2 tablets (20 mEq total) by mouth daily., Disp: 60 tablet, Rfl: 1 .  spironolactone (ALDACTONE) 25 MG tablet, Take 1 tablet (25 mg total) by mouth daily., Disp: 30 tablet, Rfl: 3 .  torsemide (DEMADEX) 20 MG tablet, Take 2 tablets (40 mg total) by mouth daily., Disp: 60 tablet, Rfl: 3 .  Travoprost, BAK Free, (TRAVATAN) 0.004 % SOLN ophthalmic solution, Place 1 drop into both eyes at bedtime. , Disp: , Rfl:  Allergies  Allergen Reactions  . Other Other (See Comments)    Blood products- Patient won't accept any  . Sulfa Antibiotics Rash  . Uloric [Febuxostat] Rash     Social History   Socioeconomic History  . Marital status: Widowed    Spouse name: Not on file  . Number of children: 3  . Years of education: Not on file  . Highest education level: Some college, no degree  Occupational History  . Occupation: retired  Tobacco Use  . Smoking status: Former Smoker    Packs/day: 1.00    Years: 22.00    Pack years: 22.00    Types: Cigarettes    Quit date: 1983    Years since quitting: 38.6  . Smokeless tobacco: Never Used  Vaping Use  . Vaping Use: Never used  Substance and Sexual Activity  . Alcohol use: Yes    Comment: 05/22/2018 "glass of wine couple times/wk"  . Drug use: Not Currently  . Sexual activity: Not Currently    Birth control/protection: None,  Post-menopausal  Other Topics Concern  . Not on file  Social History Narrative  . Not on file   Social Determinants of Health   Financial Resource Strain:   . Difficulty of Paying Living Expenses:   Food Insecurity:   . Worried About Charity fundraiser in the Last Year:   . Arboriculturist in the Last Year:   Transportation Needs:   . Film/video editor (Medical):   Marland Kitchen Lack of Transportation (Non-Medical):   Physical Activity:   . Days of Exercise per Week:   . Minutes of Exercise per Session:   Stress:   . Feeling of Stress :   Social Connections:   . Frequency of Communication with Friends and  Family:   . Frequency of Social Gatherings with Friends and Family:   . Attends Religious Services:   . Active Member of Clubs or Organizations:   . Attends Archivist Meetings:   Marland Kitchen Marital Status:   Intimate Partner Violence:   . Fear of Current or Ex-Partner:   . Emotionally Abused:   Marland Kitchen Physically Abused:   . Sexually Abused:     Physical Exam Vitals reviewed.  Constitutional:      Appearance: She is normal weight.  HENT:     Head: Normocephalic.     Nose: Nose normal.     Mouth/Throat:     Mouth: Mucous membranes are moist.  Eyes:     Pupils: Pupils are equal, round, and reactive to light.  Cardiovascular:     Rate and Rhythm: Normal rate and regular rhythm.     Pulses: Normal pulses.     Heart sounds: Normal heart sounds.  Pulmonary:     Effort: Pulmonary effort is normal.     Breath sounds: Normal breath sounds.  Abdominal:     General: Abdomen is flat.     Palpations: Abdomen is soft.  Musculoskeletal:        General: Normal range of motion.     Cervical back: Normal range of motion.     Right lower leg: Edema present.     Left lower leg: Edema present.  Skin:    General: Skin is warm and dry.     Capillary Refill: Capillary refill takes less than 2 seconds.  Neurological:     Mental Status: She is alert. Mental status is at baseline.   Psychiatric:        Mood and Affect: Mood normal.     Arrived for home visit for Luna Glasgow who was alert and oriented reporting she feels fine but is still having some back pain as well as drops in her blood sugar despite the changes in her insulin dosage. Audryanna advised she is about to go out of town and she will schedule an appointment with her endocrinologist as soon as she gets home. I advised her to make sure she keeps a close check on her sugar levels and to eat when taking her insulin. She understood and agreed. Vitals were obtained. Medications were reviewed and confirmed. Pill box filled accordingly. Home visit complete I will see Bodhi in two weeks.   Refills: Eliquis Sprionolactone   CBG- 127     Future Appointments  Date Time Provider [place]  01/18/2020 10:00 AM [facility] PA/NP [facility] None  03/01/2020  7:20 AM [facility] DEVICE REMOTES [facility]  04/04/2020 10:30 AM Rankin, Clent Demark, MD [facility] None  04/15/2020 10:10 AM Shamleffer, Melanie Crazier, MD [facility] None     ACTION: Home visit completed Next visit planned for two weeks

## 2020-01-07 ENCOUNTER — Telehealth (HOSPITAL_COMMUNITY): Payer: Self-pay

## 2020-01-07 NOTE — Telephone Encounter (Signed)
Danielle [name] reports she is still out of town will follow up next week.

## 2020-01-14 ENCOUNTER — Other Ambulatory Visit (HOSPITAL_COMMUNITY): Payer: Self-pay

## 2020-01-14 NOTE — Progress Notes (Signed)
Paramedicine Encounter    Patient ID: Danielle [name], female    DOB: 1946-05-02, 74 y.o.   MRN: [id number]   Patient Care Team: Seward Carol, MD as PCP - General (Internal Medicine) Constance Haw, MD as PCP - Electrophysiology (Cardiology) Sueanne Margarita, MD as PCP - Sleep Medicine (Cardiology) Jorge Ny, LCSW as Social Worker (Licensed Clinical Social Worker)  Patient Active Problem List   Diagnosis Date Noted  . Severe nonproliferative diabetic retinopathy of left eye, with macular edema, associated with type 2 diabetes mellitus ([place]) 11/05/2019  . Severe nonproliferative diabetic retinopathy of right eye, with macular edema, associated with type 2 diabetes mellitus ([place]) 11/05/2019  . Retinal hemorrhage of right eye 11/05/2019  . Retinal hemorrhage of left eye 11/05/2019  . Type 2 diabetes mellitus with hyperglycemia, with long-term current use of insulin ([place]) 05/14/2019  . Diabetes mellitus ([place]) 05/14/2019  . 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, with long-term current use of insulin ([place]) 05/14/2019  . Type 2 diabetes mellitus with diabetic polyneuropathy, with long-term current use of insulin ([place]) 05/14/2019  . Encephalopathy acute 05/20/2018  . 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 Acute kidney failure ([place]) 05/15/2018  . Primary localized osteoarthritis of right knee 05/13/2018  . (HFpEF) heart failure with preserved ejection fraction ([place]) 12/20/2017  . Obesity (BMI 30-39.9) 11/11/2017  . OSA (obstructive sleep apnea) 02/18/2017  . Essential hypertension   . Diabetes mellitus without complication ([place])   . Cancer ([name])   . Thyroid disease     Current Outpatient Medications:  .  acetaminophen (TYLENOL) 500 MG tablet, Take 500-1,000 mg by mouth every 6 (six) hours as needed (for pain or headaches)., Disp: , Rfl:  .  allopurinol (ZYLOPRIM) 100 MG tablet, Take 1 tablet (100 mg total) by mouth daily., Disp: ,  Rfl:  .  apixaban (ELIQUIS) 5 MG TABS tablet, Take 1 tablet (5 mg total) by mouth 2 (two) times daily., Disp: 60 tablet, Rfl: 3 .  atorvastatin (LIPITOR) 80 MG tablet, Take 1 tablet (80 mg total) by mouth at bedtime., Disp: 30 tablet, Rfl: 1 .  Biotin 10 MG CAPS, Take 10 mg by mouth daily. , Disp: , Rfl:  .  Blood Glucose Monitoring Suppl (ACCU-CHEK AVIVA) device, by Other route. Use as instructed, Disp: , Rfl:  .  calcium citrate (CALCITRATE - DOSED IN MG ELEMENTAL CALCIUM) 950 MG tablet, Take 1 tablet by mouth 2 (two) times daily.  (Patient not taking: Reported on 12/17/2019), Disp: , Rfl:  .  carvedilol (COREG) 25 MG tablet, Take 1 tablet (25 mg total) by mouth 2 (two) times daily with a meal., Disp: 30 tablet, Rfl: 1 .  CHONDROITIN SULFATE PO, Take 250 mg by mouth daily.  (Patient not taking: Reported on 11/26/2019), Disp: , Rfl:  .  colchicine 0.6 MG tablet, TAKE 1 TABLET BY MOUTH EVERY OTHER DAY TO PREVENT FLARES, Disp: , Rfl:  .  empagliflozin (JARDIANCE) 10 MG TABS tablet, Take 1 tablet (10 mg total) by mouth daily., Disp: 30 tablet, Rfl: 1 .  ferrous sulfate 325 (65 FE) MG tablet, Take 1 tablet (325 mg total) by mouth daily with breakfast., Disp: 90 tablet, Rfl: 3 .  gabapentin (NEURONTIN) 300 MG capsule, Take 300 mg by mouth daily. , Disp: , Rfl:  .  glucose blood test strip, Use as instructed to test blood sugars 3 times daily E11.65  Freestyle lite, Disp: 400 each, Rfl: 3 .  insulin glargine (LANTUS) 100 UNIT/ML injection, Inject 88 Units into the skin daily., Disp: , Rfl:  .  insulin lispro (HUMALOG KWIKPEN) 200 UNIT/ML KwikPen, Inject 28 Units into the skin 3 (three) times daily., Disp: 30 mL, Rfl: 6 .  Insulin Pen Needle 32G X 4 MM MISC, 1 Device by Does not apply route 4 (four) times daily., Disp: 400 each, Rfl: 3 .  isosorbide mononitrate (IMDUR) 30 MG 24 hr tablet, TAKE 1 TABLET BY MOUTH ONCE DAILY, Disp: 30 tablet, Rfl: 1 .  levothyroxine (SYNTHROID, LEVOTHROID) 75 MCG tablet, Take  75 mcg by mouth daily before breakfast., Disp: , Rfl:  .  losartan (COZAAR) 50 MG tablet, TAKE 1 TABLET BY MOUTH AT BEDTIME, Disp: 30 tablet, Rfl: 1 .  Multiple Vitamins-Minerals (ALIVE ONCE DAILY WOMENS 50+ PO), Take 1 tablet by mouth daily., Disp: , Rfl:  .  nitroGLYCERIN (NITROSTAT) 0.4 MG SL tablet, DISSOLVE ONE TABLET UNDER THE TONGUE EVERY 5 MINUTES AS NEEDED FOR CHEST PAIN. (Patient not taking: Reported on 11/26/2019), Disp: 25 tablet, Rfl: 3 .  Omega-3 Fatty Acids (FISH OIL PO), Take 1 capsule by mouth daily., Disp: , Rfl:  .  potassium chloride (KLOR-CON) 10 MEQ tablet, Take 2 tablets (20 mEq total) by mouth daily., Disp: 60 tablet, Rfl: 1 .  spironolactone (ALDACTONE) 25 MG tablet, Take 1 tablet (25 mg total) by mouth daily., Disp: 30 tablet, Rfl: 3 .  torsemide (DEMADEX) 20 MG tablet, Take 2 tablets (40 mg total) by mouth daily., Disp: 60 tablet, Rfl: 3 .  Travoprost, BAK Free, (TRAVATAN) 0.004 % SOLN ophthalmic solution, Place 1 drop into both eyes at bedtime. , Disp: , Rfl:  Allergies  Allergen Reactions  . Other Other (See Comments)    Blood products- Patient won't accept any  . Sulfa Antibiotics Rash  . Uloric [Febuxostat] Rash     Social History   Socioeconomic History  . Marital status: Widowed    Spouse name: Not on file  . Number of children: 3  . Years of education: Not on file  . Highest education level: Some college, no degree  Occupational History  . Occupation: retired  Tobacco Use  . Smoking status: Former Smoker    Packs/day: 1.00    Years: 22.00    Pack years: 22.00    Types: Cigarettes    Quit date: 1983    Years since quitting: 38.6  . Smokeless tobacco: Never Used  Vaping Use  . Vaping Use: Never used  Substance and Sexual Activity  . Alcohol use: Yes    Comment: 05/22/2018 "glass of wine couple times/wk"  . Drug use: Not Currently  . Sexual activity: Not Currently    Birth control/protection: None, Post-menopausal  Other Topics Concern  . Not  on file  Social History Narrative  . Not on file   Social Determinants of Health   Financial Resource Strain:   . Difficulty of Paying Living Expenses: Not on file  Food Insecurity:   . Worried About Charity fundraiser in the Last Year: Not on file  . Ran Out of Food in the Last Year: Not on file  Transportation Needs:   . Lack of Transportation (Medical): Not on file  . Lack of Transportation (Non-Medical): Not on file  Physical Activity:   . Days of Exercise per Week: Not on file  . Minutes of Exercise per Session: Not on file  Stress:   . Feeling of Stress : Not on file  Social  Connections:   . Frequency of Communication with Friends and Family: Not on file  . Frequency of Social Gatherings with Friends and Family: Not on file  . Attends Religious Services: Not on file  . Active Member of Clubs or Organizations: Not on file  . Attends Archivist Meetings: Not on file  . Marital Status: Not on file  Intimate Partner Violence:   . Fear of Current or Ex-Partner: Not on file  . Emotionally Abused: Not on file  . Physically Abused: Not on file  . Sexually Abused: Not on file    Physical Exam Vitals reviewed.  Constitutional:      Appearance: She is normal weight.  HENT:     Head: Normocephalic.     Nose: Nose normal.     Mouth/Throat:     Mouth: Mucous membranes are moist.  Eyes:     Pupils: Pupils are equal, round, and reactive to light.  Cardiovascular:     Rate and Rhythm: Normal rate and regular rhythm.     Pulses: Normal pulses.     Heart sounds: Normal heart sounds.  Pulmonary:     Effort: Pulmonary effort is normal.     Breath sounds: Normal breath sounds.  Abdominal:     General: Abdomen is flat.     Palpations: Abdomen is soft.  Musculoskeletal:        General: Swelling present. Normal range of motion.     Cervical back: Normal range of motion.     Right lower leg: Edema present.     Left lower leg: Edema present.  Skin:    General: Skin  is warm and dry.     Capillary Refill: Capillary refill takes less than 2 seconds.  Neurological:     General: No focal deficit present.     Mental Status: She is alert. Mental status is at baseline.  Psychiatric:        Mood and Affect: Mood normal.    Arrived for home visit for [place] who was alert and oriented seated in her apartment with no complaints aside from her chronic back pain. Chrysta has been compliant with her medications over the last several weeks since she has been out on vacation. Tessy's medications were verified and confirmed. Pill box filled accordingly. Vitals were obtained and are as noted. Anabela reminded of upcoming appointments and she was assisted in seeking dental appointment. Home visit complete.   Refills: Torsemide   CBG- 282      Future Appointments  Date Time Provider [place]  01/18/2020 10:00 AM [facility] PA/NP [facility] None  02/05/2020  9:00 AM Waynetta Sandy, MD [facility]  03/01/2020  7:20 AM [facility] DEVICE REMOTES [facility]  04/04/2020 10:30 AM Rankin, Clent Demark, MD [facility] None  04/15/2020 10:10 AM Shamleffer, Melanie Crazier, MD [facility] None     ACTION: Home visit completed Next visit planned for one week

## 2020-01-17 NOTE — Progress Notes (Signed)
Advanced Heart Failure Clinic Note    Primary Care: Dr. Delfina Redwood Primary Cardiologist: Dr. Haroldine Laws  EP: Dr Tomasa Blase Rheumatology: Dr Posey Rea  HPI:  Danielle [name] is a 74 y.o. female with a past medical history of systolic CHF (EF 74-94%), CAD s/p multiple stents in [place] (with h/o LAD stent thrombosis), ischemic cardiomyopathy (EF 10% at one point), symptomatic bradycardia s/p PPM, HTN, DM, hypothyroidism, and OSA sleep study 03/2017.   Admitted 02/03/17-02/06/17 with SOB, acute on chronic systolic CHF. Diuresed with IV lasix (12 pounds), discharge weight was 215 pounds.   Had R TKA 12/19.   Today she returns for HF follow up.Overall feeling fine. Complains of joint pain. Complaining of increased fatigue and leg edema. Mild SOB with exertion. Denies SOB/PND/Orthopnea. Appetite ok. Recently she has been eating salty foods. No fever or chills. Weight at home trending up 208-->219 pounds. Using CPAP every night. No chest pain. Taking all medications. Followed by HF Paramedicine.   ECHO 01/2017- EF 45-50% Grade 2DD Left Atrium Severely dilated.  ECHO 12/2017 EF 45-50%, G2DD, LA mildly dilated, trileaflet moderately thick aortic valve  Review of systems complete and found to be negative unless listed in HPI.    Past Medical History:  Diagnosis Date  . Acute combined systolic and diastolic congestive heart failure ([place]) 02/05/2017  . 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 Acute kidney failure ([place]) 05/15/2018  . Arthritis    "all over" (05/22/2018)  . Blood dyscrasia    per pt-has small blood cells-appears as if anemic  . Breast cancer, left breast ([place]) 1985  . CHF (congestive heart failure) ([place])   . Complication of anesthesia    difficult to awaken from per pt  . Coronary artery disease   . Dyspnea   . Essential hypertension   . Heart disease   . Heart murmur   . History of gout   . Hypothyroidism   . Obesity (BMI 30-39.9) 11/11/2017  . OSA (obstructive  sleep apnea) 02/18/2017    severe obstructive sleep apnea with an AHI of 75.6/h and mild central sleep apnea with a CAI of 7.7/h.  Oxygen saturations dropped as low as 82%.   He is on CPAP at 9 cm H2O.  . OSA on CPAP   . Personal history of chemotherapy   . Personal history of radiation therapy   . Presence of permanent cardiac pacemaker   . Primary localized osteoarthritis of right knee 05/13/2018  . Refusal of blood transfusions as patient is Jehovah's Witness   . Type II diabetes mellitus ([place])     Current Outpatient Medications  Medication Sig Dispense Refill  . acetaminophen (TYLENOL) 500 MG tablet Take 500-1,000 mg by mouth every 6 (six) hours as needed (for pain or headaches).    Marland Kitchen allopurinol (ZYLOPRIM) 100 MG tablet Take 1 tablet (100 mg total) by mouth daily.    Marland Kitchen apixaban (ELIQUIS) 5 MG TABS tablet Take 1 tablet (5 mg total) by mouth 2 (two) times daily. 60 tablet 3  . atorvastatin (LIPITOR) 80 MG tablet Take 1 tablet (80 mg total) by mouth at bedtime. 30 tablet 1  . Biotin 10 MG CAPS Take 10 mg by mouth daily.     . Blood Glucose Monitoring Suppl (ACCU-CHEK AVIVA) device by Other route. Use as instructed    . carvedilol (COREG) 25 MG tablet Take 1 tablet (25 mg total) by mouth 2 (two) times daily with a meal. 30 tablet 1  . colchicine  0.6 MG tablet TAKE 1 TABLET BY MOUTH EVERY OTHER DAY TO PREVENT FLARES    . empagliflozin (JARDIANCE) 10 MG TABS tablet Take 1 tablet (10 mg total) by mouth daily. 30 tablet 1  . ferrous sulfate 325 (65 FE) MG tablet Take 1 tablet (325 mg total) by mouth daily with breakfast. 90 tablet 3  . gabapentin (NEURONTIN) 300 MG capsule Take 300 mg by mouth daily.     Marland Kitchen glucose blood test strip Use as instructed to test blood sugars 3 times daily E11.65  Freestyle lite 400 each 3  . insulin glargine (LANTUS) 100 UNIT/ML injection Inject 88 Units into the skin daily.    . insulin lispro (HUMALOG KWIKPEN) 200 UNIT/ML KwikPen Inject 28 Units into the skin 3  (three) times daily. 30 mL 6  . Insulin Pen Needle 32G X 4 MM MISC 1 Device by Does not apply route 4 (four) times daily. 400 each 3  . isosorbide mononitrate (IMDUR) 30 MG 24 hr tablet TAKE 1 TABLET BY MOUTH ONCE DAILY 30 tablet 1  . levothyroxine (SYNTHROID, LEVOTHROID) 75 MCG tablet Take 75 mcg by mouth daily before breakfast.    . losartan (COZAAR) 50 MG tablet TAKE 1 TABLET BY MOUTH AT BEDTIME 30 tablet 1  . Multiple Vitamins-Minerals (ALIVE ONCE DAILY WOMENS 50+ PO) Take 1 tablet by mouth daily.    . Omega-3 Fatty Acids (FISH OIL PO) Take 1 capsule by mouth daily.    . potassium chloride (KLOR-CON) 10 MEQ tablet Take 2 tablets (20 mEq total) by mouth daily. 60 tablet 1  . spironolactone (ALDACTONE) 25 MG tablet Take 1 tablet (25 mg total) by mouth daily. 30 tablet 3  . torsemide (DEMADEX) 20 MG tablet Take 2 tablets (40 mg total) by mouth daily. 60 tablet 3  . Travoprost, BAK Free, (TRAVATAN) 0.004 % SOLN ophthalmic solution Place 1 drop into both eyes at bedtime.     . nitroGLYCERIN (NITROSTAT) 0.4 MG SL tablet DISSOLVE ONE TABLET UNDER THE TONGUE EVERY 5 MINUTES AS NEEDED FOR CHEST PAIN. (Patient not taking: Reported on 11/26/2019) 25 tablet 3   No current facility-administered medications for this encounter.   Allergies  Allergen Reactions  . Other Other (See Comments)    Blood products- Patient won't accept any  . Sulfa Antibiotics Rash  . Uloric [Febuxostat] Rash   Social History   Socioeconomic History  . Marital status: Widowed    Spouse name: Not on file  . Number of children: 3  . Years of education: Not on file  . Highest education level: Some college, no degree  Occupational History  . Occupation: retired  Tobacco Use  . Smoking status: Former Smoker    Packs/day: 1.00    Years: 22.00    Pack years: 22.00    Types: Cigarettes    Quit date: 1983    Years since quitting: 38.6  . Smokeless tobacco: Never Used  Vaping Use  . Vaping Use: Never used  Substance and  Sexual Activity  . Alcohol use: Yes    Comment: 05/22/2018 "glass of wine couple times/wk"  . Drug use: Not Currently  . Sexual activity: Not Currently    Birth control/protection: None, Post-menopausal  Other Topics Concern  . Not on file  Social History Narrative  . Not on file   Social Determinants of Health   Financial Resource Strain:   . Difficulty of Paying Living Expenses: Not on file  Food Insecurity:   . Worried About Running  Out of Food in the Last Year: Not on file  . Ran Out of Food in the Last Year: Not on file  Transportation Needs:   . Lack of Transportation (Medical): Not on file  . Lack of Transportation (Non-Medical): Not on file  Physical Activity:   . Days of Exercise per Week: Not on file  . Minutes of Exercise per Session: Not on file  Stress:   . Feeling of Stress : Not on file  Social Connections:   . Frequency of Communication with Friends and Family: Not on file  . Frequency of Social Gatherings with Friends and Family: Not on file  . Attends Religious Services: Not on file  . Active Member of Clubs or Organizations: Not on file  . Attends Archivist Meetings: Not on file  . Marital Status: Not on file  Intimate Partner Violence:   . Fear of Current or Ex-Partner: Not on file  . Emotionally Abused: Not on file  . Physically Abused: Not on file  . Sexually Abused: Not on file   Family History  Problem Relation Age of Onset  . Multiple myeloma Mother   . Heart disease Father   . Other Sister   . Heart disease Brother   . Diabetes Sister   . Other Sister    Vitals:   01/18/20 1010  BP: 110/78  Pulse: 76  SpO2: 95%  Weight: 101.2 kg (223 lb)     Wt Readings from Last 3 Encounters:  01/18/20 101.2 kg (223 lb)  01/14/20 94.3 kg (208 lb)  12/24/19 (!) 98.4 kg (217 lb)    PHYSICAL EXAM: General: Walked in the clinic with a rolling walker. No resp difficulty HEENT: normal Neck: supple. JVP 10-11. Carotids 2+ bilat; no bruits.  No lymphadenopathy or thryomegaly appreciated. Cor: PMI nondisplaced. Regular rate & rhythm. No rubs, gallops or murmurs. Lungs: clear Abdomen: soft, nontender, nondistended. No hepatosplenomegaly. No bruits or masses. Good bowel sounds. Extremities: no cyanosis, clubbing, rash, R and LLE 2+ edema Neuro: alert & orientedx3, cranial nerves grossly intact. moves all 4 extremities w/o difficulty. Affect pleasant  EKG: SR 1st degree heart block PR 210 ms.   ASSESSMENT & PLAN:  1. Chronic systolic CHF: ICM, Echo 01/4708 EF 45-50%, G2DD - NYHA III. Volume status elevated in the setting of high sodium diet.  -Referred to research for "AT HOME" study with SQ lasix however she declined. .  - Asked HF Paramedicine to give 80 mg IV lasix today + 20 meq potassium and hold torsemide today.   - Tomorrow she will restart torsemide 40 daily.   - Continue imdur 30 mg daily - Continue losartan 50 mg daily. - Continue 25 mg spironolactone daily.  - Continue Coreg 25 mg twice a day. - Continue jardiance 10 mg daily.  - Check BMET today and on Thursday.   2. CAD: Stenting in 2010, 2012 and 2016.  - No chest pain. - Continue statin. No ASA with Eliquis.  3. Paroxysmal Afib -In NSR today.  - Continue Eliquis 5 mg BID. No bleeding - Following in Afib clinic.   4. HTN Stable.   5. DM - Per Dr. Delfina Redwood.  - Continue Jardiance.   6. H/o sinus pauses s/p PPM - Dr. Curt Bears now following. No change.   7. Gout - Per PCP.  - Has follow up with Rheumatology   8. Arthritis - Continue tylenol. Per PCP - Encourage f/u with Rheum  9. OSA-  - Continue CPAP  nightly.  - Follows with Dr Radford Pax  Greater than 50% of the (total minutes 40) visit spent in counseling/coordination of care regarding the above. I personally discussed with HF Paramedic IV lasix. Follow up on Thursday to reassess volume status and to check BMET. Discussed low salt food choices.     Darrick Grinder, NP 01/18/20

## 2020-01-18 ENCOUNTER — Encounter (HOSPITAL_COMMUNITY): Payer: Self-pay | Admitting: *Deleted

## 2020-01-18 ENCOUNTER — Other Ambulatory Visit: Payer: Self-pay | Admitting: Internal Medicine

## 2020-01-18 ENCOUNTER — Other Ambulatory Visit: Payer: Self-pay

## 2020-01-18 ENCOUNTER — Other Ambulatory Visit (HOSPITAL_COMMUNITY): Payer: Self-pay

## 2020-01-18 ENCOUNTER — Ambulatory Visit (HOSPITAL_COMMUNITY)
Admission: RE | Admit: 2020-01-18 | Discharge: 2020-01-18 | Disposition: A | Discharge status: Home / Self Care | Payer: Medicare HMO | Source: Ambulatory Visit | Attending: Internal Medicine | Admitting: Internal Medicine

## 2020-01-18 VITALS — BP 110/78 | HR 76 | Wt 223.0 lb

## 2020-01-18 DIAGNOSIS — Z882 Allergy status to sulfonamides status: Secondary | ICD-10-CM | POA: Diagnosis not present

## 2020-01-18 DIAGNOSIS — Z923 Personal history of irradiation: Secondary | ICD-10-CM | POA: Diagnosis not present

## 2020-01-18 DIAGNOSIS — Z833 Family history of diabetes mellitus: Secondary | ICD-10-CM | POA: Insufficient documentation

## 2020-01-18 DIAGNOSIS — I480 Paroxysmal atrial fibrillation: Secondary | ICD-10-CM | POA: Diagnosis not present

## 2020-01-18 DIAGNOSIS — I2510 Atherosclerotic heart disease of native coronary artery without angina pectoris: Secondary | ICD-10-CM | POA: Diagnosis not present

## 2020-01-18 DIAGNOSIS — Z87891 Personal history of nicotine dependence: Secondary | ICD-10-CM | POA: Insufficient documentation

## 2020-01-18 DIAGNOSIS — G4733 Obstructive sleep apnea (adult) (pediatric): Secondary | ICD-10-CM | POA: Diagnosis not present

## 2020-01-18 DIAGNOSIS — Z9221 Personal history of antineoplastic chemotherapy: Secondary | ICD-10-CM | POA: Insufficient documentation

## 2020-01-18 DIAGNOSIS — E039 Hypothyroidism, unspecified: Secondary | ICD-10-CM | POA: Diagnosis not present

## 2020-01-18 DIAGNOSIS — Z7901 Long term (current) use of anticoagulants: Secondary | ICD-10-CM | POA: Diagnosis not present

## 2020-01-18 DIAGNOSIS — E669 Obesity, unspecified: Secondary | ICD-10-CM | POA: Diagnosis not present

## 2020-01-18 DIAGNOSIS — Z79899 Other long term (current) drug therapy: Secondary | ICD-10-CM | POA: Insufficient documentation

## 2020-01-18 DIAGNOSIS — Z888 Allergy status to other drugs, medicaments and biological substances status: Secondary | ICD-10-CM | POA: Diagnosis not present

## 2020-01-18 DIAGNOSIS — I5042 Chronic combined systolic (congestive) and diastolic (congestive) heart failure: Secondary | ICD-10-CM

## 2020-01-18 DIAGNOSIS — E119 Type 2 diabetes mellitus without complications: Secondary | ICD-10-CM | POA: Insufficient documentation

## 2020-01-18 DIAGNOSIS — I110 Hypertensive heart disease with heart failure: Secondary | ICD-10-CM | POA: Diagnosis not present

## 2020-01-18 DIAGNOSIS — Z950 Presence of cardiac pacemaker: Secondary | ICD-10-CM | POA: Insufficient documentation

## 2020-01-18 DIAGNOSIS — Z853 Personal history of malignant neoplasm of breast: Secondary | ICD-10-CM | POA: Insufficient documentation

## 2020-01-18 DIAGNOSIS — M109 Gout, unspecified: Secondary | ICD-10-CM | POA: Diagnosis not present

## 2020-01-18 DIAGNOSIS — Z8249 Family history of ischemic heart disease and other diseases of the circulatory system: Secondary | ICD-10-CM | POA: Insufficient documentation

## 2020-01-18 DIAGNOSIS — M1990 Unspecified osteoarthritis, unspecified site: Secondary | ICD-10-CM | POA: Insufficient documentation

## 2020-01-18 DIAGNOSIS — I5030 Unspecified diastolic (congestive) heart failure: Secondary | ICD-10-CM | POA: Diagnosis not present

## 2020-01-18 DIAGNOSIS — Z955 Presence of coronary angioplasty implant and graft: Secondary | ICD-10-CM | POA: Diagnosis not present

## 2020-01-18 DIAGNOSIS — Z794 Long term (current) use of insulin: Secondary | ICD-10-CM | POA: Insufficient documentation

## 2020-01-18 DIAGNOSIS — I255 Ischemic cardiomyopathy: Secondary | ICD-10-CM | POA: Insufficient documentation

## 2020-01-18 DIAGNOSIS — Z1231 Encounter for screening mammogram for malignant neoplasm of breast: Secondary | ICD-10-CM

## 2020-01-18 LAB — BASIC METABOLIC PANEL
Anion gap: 13 (ref 5–15)
BUN: 24 mg/dL — ABNORMAL HIGH (ref 8–23)
CO2: 24 mmol/L (ref 22–32)
Calcium: 9.4 mg/dL (ref 8.9–10.3)
Chloride: 104 mmol/L (ref 98–111)
Creatinine, Ser: 1.61 mg/dL — ABNORMAL HIGH (ref 0.44–1.00)
GFR calc Af Amer: 36 mL/min — ABNORMAL LOW (ref >60)
GFR calc non Af Amer: 31 mL/min — ABNORMAL LOW (ref >60)
Glucose, Bld: 126 mg/dL — ABNORMAL HIGH (ref 70–99)
Potassium: 4.1 mmol/L (ref 3.5–5.1)
Sodium: 141 mmol/L (ref 135–145)

## 2020-01-18 NOTE — Patient Instructions (Signed)
DO NOT take any Torsemide today 01/18/2020 80 mg of IV Lasix to be given at home by ParaMedicine  Your physician recommends that you schedule a follow-up appointment in: 3-4 days  Do the following things EVERYDAY: 1) Weigh yourself in the morning before breakfast. Write it down and keep it in a log. 2) Take your medicines as prescribed 3) Eat low salt foods--Limit salt (sodium) to 2000 mg per day.  4) Stay as active as you can everyday 5) Limit all fluids for the day to less than 2 liters  If you have any questions or concerns before your next appointment please send [place] a message through [place] or call our office at [phone].    TO LEAVE A MESSAGE FOR THE NURSE SELECT OPTION 2, PLEASE LEAVE A MESSAGE INCLUDING: . YOUR NAME . DATE OF BIRTH . CALL BACK NUMBER . REASON FOR CALL**this is important as we prioritize the call backs  YOU WILL RECEIVE A CALL BACK THE SAME DAY AS LONG AS YOU CALL BEFORE 4:00 PM

## 2020-01-18 NOTE — Progress Notes (Signed)
Paramedicine Encounter  Saw Shawntay in clinic today with PA [name] Clegg who reviewed medications, vitals obtained as well as labs drawn. Renal function stable per labs. Due to fluid evidence and weight gain, IV lasix ordered by PA [name] Clegg. Patient complained of frequent issues with constipation, PA recommends stool softener. Reika complains of chronic back pain in which she will be seen by chiropractor tomorrow. Visit complete in clinic.   Arrived at 2 for home visit for IV lasix administration. Site prepped with appropriate supplies, IV obtained with 24ga IV and secured. IV Lasix 80mg  given per order. Saline flushes proceed med administration. IV assessed and site noted to have no swelling, redness or signs of infiltration. IV removed and site bandaged. [place] denied any complaints with IV Lasix administration. Felipe began urinating within 55 mins. Home visit complete. Ronin will follow up in lab for blood draw Thursday. I will see her Thursday in the home for follow up. Home visit complete.    ACTION: Home visit completed Next visit planned for Thursday

## 2020-01-18 NOTE — Progress Notes (Signed)
Patient was asked to be seen by Paramedic, [name], during the patient's AHF clinic visit today. Patient is now back home and is ready to enroll and register for PREP program. CEP/[facility] reviewed program specifics with her and she is awaiting a call for enrollment. Will speak and refer to PREP manager for coordination of this for the next class opening. Advised patient there is a waitlist and this may involve a few weeks for the next class to start. She was agreeable with plan and was provided contact information should she need further assistance.     Landis Martins, MS, ACSM, [facility] Clinical Exercise Physiologist/ Health and Wellness Coach

## 2020-01-20 ENCOUNTER — Telehealth (HOSPITAL_COMMUNITY): Payer: Self-pay

## 2020-01-20 NOTE — Progress Notes (Signed)
Advanced Heart Failure Clinic Note    Primary Care: Dr. Delfina Redwood Primary Cardiologist: Dr. Haroldine Laws  EP: Dr Tomasa Blase Rheumatology: Dr Posey Rea  HPI:  Danielle [name] is a 74 y.o. female with a past medical history of systolic CHF (EF 95-63%), CAD s/p multiple stents in [place] (with h/o LAD stent thrombosis), ischemic cardiomyopathy (EF 10% at one point), symptomatic bradycardia s/p PPM, HTN, DM, hypothyroidism, and OSA sleep study 03/2017.   Admitted 02/03/17-02/06/17 with SOB, acute on chronic systolic CHF. Diuresed with IV lasix (12 pounds), discharge weight was 215 pounds.   Had R TKA 12/19.   She was seen in the HF clininc 8/23 and was volume overloaded. Given 80 mg IV lasix by HF Paramedicine.   Today she returns for HF follow up.Overall feeling fine. Says she had good urine output with IV lasix.  Denies SOB/PND/Orthopnea. Using CPAP.  Appetite ok. No fever or chills. Weight at home 217 pounds. Taking all medications. Followed by HF Paramedicine.   ECHO 01/2017- EF 45-50% Grade 2DD Left Atrium Severely dilated.  ECHO 12/2017 EF 45-50%, G2DD, LA mildly dilated, trileaflet moderately thick aortic valve  Review of systems complete and found to be negative unless listed in HPI.    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[place]) 02/05/2017   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 05/20/2018   Acute kidney failure ([place]) 05/15/2018   Arthritis    "all over" (05/22/2018)   Blood dyscrasia    per pt-has small blood cells-appears as if anemic   Breast cancer, left breast ([place]) 1985   CHF (congestive heart failure) (HCC)    Complication of anesthesia    difficult to awaken from per pt   Coronary artery disease    Dyspnea    Essential hypertension    Heart disease    Heart murmur    History of gout    Hypothyroidism    Obesity (BMI 30-39.9) 11/11/2017   OSA (obstructive sleep apnea) 02/18/2017     severe obstructive sleep apnea with an AHI of 75.6/h and mild central sleep apnea with a CAI of 7.7/h.  Oxygen saturations dropped as low as 82%.   He is on CPAP at 9 cm H2O.   OSA on CPAP    Personal history of chemotherapy    Personal history of radiation therapy    Presence of permanent cardiac pacemaker    Primary localized osteoarthritis of right knee 05/13/2018   Refusal of blood transfusions as patient is Jehovah's Witness    Type II diabetes mellitus ([place])     Current Outpatient Medications  Medication Sig Dispense Refill   acetaminophen (TYLENOL) 500 MG tablet Take 500-1,000 mg by mouth every 6 (six) hours as needed (for pain or headaches).     allopurinol (ZYLOPRIM) 100 MG tablet Take 1 tablet (100 mg total) by mouth daily.     apixaban (ELIQUIS) 5 MG TABS tablet Take 1 tablet (5 mg total) by mouth 2 (two) times daily. 60 tablet 3   atorvastatin (LIPITOR) 80 MG tablet Take 1 tablet (80 mg total) by mouth at bedtime. 30 tablet 1   Biotin 10 MG CAPS Take 10 mg by mouth daily.      Blood Glucose Monitoring Suppl (ACCU-CHEK AVIVA) device by Other route. Use as instructed     carvedilol (COREG) 25 MG tablet Take 1 tablet (25 mg total) by mouth 2 (two) times daily with a meal. 30 tablet  1   colchicine 0.6 MG tablet TAKE 1 TABLET BY MOUTH EVERY OTHER DAY TO PREVENT FLARES     empagliflozin (JARDIANCE) 10 MG TABS tablet Take 1 tablet (10 mg total) by mouth daily. 30 tablet 1   ferrous sulfate 325 (65 FE) MG tablet Take 1 tablet (325 mg total) by mouth daily with breakfast. 90 tablet 3   gabapentin (NEURONTIN) 300 MG capsule Take 300 mg by mouth daily.      glucose blood test strip Use as instructed to test blood sugars 3 times daily E11.65  Freestyle lite 400 each 3   insulin glargine (LANTUS) 100 UNIT/ML injection Inject 88 Units into the skin daily.     insulin lispro (HUMALOG KWIKPEN) 200 UNIT/ML KwikPen Inject 28 Units into the skin 3 (three) times daily. 30 mL 6     Insulin Pen Needle 32G X 4 MM MISC 1 Device by Does not apply route 4 (four) times daily. 400 each 3   isosorbide mononitrate (IMDUR) 30 MG 24 hr tablet TAKE 1 TABLET BY MOUTH ONCE DAILY 30 tablet 1   levothyroxine (SYNTHROID, LEVOTHROID) 75 MCG tablet Take 75 mcg by mouth daily before breakfast.     losartan (COZAAR) 50 MG tablet TAKE 1 TABLET BY MOUTH AT BEDTIME 30 tablet 1   Multiple Vitamins-Minerals (ALIVE ONCE DAILY WOMENS 50+ PO) Take 1 tablet by mouth daily.     nitroGLYCERIN (NITROSTAT) 0.4 MG SL tablet DISSOLVE ONE TABLET UNDER THE TONGUE EVERY 5 MINUTES AS NEEDED FOR CHEST PAIN. 25 tablet 3   Omega-3 Fatty Acids (FISH OIL PO) Take 1 capsule by mouth daily.     potassium chloride (KLOR-CON) 10 MEQ tablet Take 2 tablets (20 mEq total) by mouth daily. 60 tablet 1   spironolactone (ALDACTONE) 25 MG tablet Take 1 tablet (25 mg total) by mouth daily. 30 tablet 3   torsemide (DEMADEX) 20 MG tablet Take 2 tablets (40 mg total) by mouth daily. 60 tablet 3   Travoprost, BAK Free, (TRAVATAN) 0.004 % SOLN ophthalmic solution Place 1 drop into both eyes at bedtime.      No current facility-administered medications for this encounter.   Allergies  Allergen Reactions   Other Other (See Comments)    Blood products- Patient won't accept any   Sulfa Antibiotics Rash   Uloric [Febuxostat] Rash   Social History   Socioeconomic History   Marital status: Widowed    Spouse name: Not on file   Number of children: 3   Years of education: Not on file   Highest education level: Some college, no degree  Occupational History   Occupation: retired  Tobacco Use   Smoking status: Former Smoker    Packs/day: 1.00    Years: 22.00    Pack years: 22.00    Types: Cigarettes    Quit date: 1983    Years since quitting: 38.6   Smokeless tobacco: Never Used  Scientific laboratory technician Use: Never used  Substance and Sexual Activity   Alcohol use: Yes    Comment: 05/22/2018 "glass of  wine couple times/wk"   Drug use: Not Currently   Sexual activity: Not Currently    Birth control/protection: None, Post-menopausal  Other Topics Concern   Not on file  Social History Narrative   Not on file   Social Determinants of Health   Financial Resource Strain:    Difficulty of Paying Living Expenses: Not on file  Food Insecurity:    Worried About Running Out  of Food in the Last Year: Not on file   Ran Out of Food in the Last Year: Not on file  Transportation Needs:    Lack of Transportation (Medical): Not on file   Lack of Transportation (Non-Medical): Not on file  Physical Activity:    Days of Exercise per Week: Not on file   Minutes of Exercise per Session: Not on file  Stress:    Feeling of Stress : Not on file  Social Connections:    Frequency of Communication with Friends and Family: Not on file   Frequency of Social Gatherings with Friends and Family: Not on file   Attends Religious Services: Not on file   Active Member of Clubs or Organizations: Not on file   Attends Archivist Meetings: Not on file   Marital Status: Not on file  Intimate Partner Violence:    Fear of Current or Ex-Partner: Not on file   Emotionally Abused: Not on file   Physically Abused: Not on file   Sexually Abused: Not on file   Family History  Problem Relation Age of Onset   Multiple myeloma Mother    Heart disease Father    Other Sister    Heart disease Brother    Diabetes Sister    Other Sister    Vitals:   01/21/20 1011  BP: 136/82  Pulse: 78  SpO2: 95%  Weight: 99.4 kg (219 lb 3.2 oz)     Wt Readings from Last 3 Encounters:  01/21/20 99.4 kg (219 lb 3.2 oz)  01/18/20 101.2 kg (223 lb)  01/18/20 101.2 kg (223 lb)    PHYSICAL EXAM: General:  Ambulated in the clinic with a walker.  No resp difficulty HEENT: normal Neck: supple. no JVD. Carotids 2+ bilat; no bruits. No lymphadenopathy or thryomegaly appreciated. Cor: PMI  nondisplaced. Regular rate & rhythm. No rubs, gallops or murmurs. Lungs: clear Abdomen: soft, nontender, nondistended. No hepatosplenomegaly. No bruits or masses. Good bowel sounds. Extremities: no cyanosis, clubbing, rash, R and LLE trace edema Neuro: alert & orientedx3, cranial nerves grossly intact. moves all 4 extremities w/o difficulty. Affect pleasant    ASSESSMENT & PLAN:  1. Chronic systolic CHF: ICM, Echo 08/4008 EF 45-50%, G2DD - NYHA II. Volume status much improved.  --Continue  torsemide 40 daily.   - Continue imdur 30 mg daily - Continue losartan 50 mg daily. - Continue 25 mg spironolactone daily.  - Continue Coreg 25 mg twice a day. - Continue jardiance 10 mg daily.  - Check BMET   2. CAD: Stenting in 2010, 2012 and 2016.  - No chest pain. - Continue statin. No ASA with Eliquis.  3. Paroxysmal Afib -regular pulse.  - Continue Eliquis 5 mg BID. No bleeding - Following in Afib clinic.   4. HTN Stable.   5. DM - Per Dr. Delfina Redwood.  - Continue Jardiance.   6. H/o sinus pauses s/p PPM - Dr. Curt Bears now following. No change.   7. Gout - Per PCP.  - Has follow up with Rheumatology   8. Arthritis - Continue tylenol. Per PCP - Encourage f/u with Rheum  9. OSA-  - Continue CPAP nightly.  - Follows with Dr Radford Pax   FOllow up in 3-4 months with Dr Haroldine Laws.  Darrick Grinder, NP 01/21/20

## 2020-01-20 NOTE — Telephone Encounter (Signed)
Spoke to Danielle [name] who reports she is feeling great after Lasix administration on Monday. Danielle [name] reports she has been urinating frequently and has lost some weight. Danielle [name] reminded of appointment tomorrow who agreed to same. I will meet patient there at 1030.

## 2020-01-21 ENCOUNTER — Encounter (HOSPITAL_COMMUNITY): Payer: Self-pay

## 2020-01-21 ENCOUNTER — Other Ambulatory Visit (HOSPITAL_COMMUNITY): Payer: Self-pay

## 2020-01-21 ENCOUNTER — Telehealth (HOSPITAL_COMMUNITY): Payer: Self-pay | Admitting: Internal Medicine

## 2020-01-21 ENCOUNTER — Ambulatory Visit (HOSPITAL_COMMUNITY)
Admission: RE | Admit: 2020-01-21 | Discharge: 2020-01-21 | Disposition: A | Discharge status: Home / Self Care | Payer: Medicare HMO | Source: Ambulatory Visit | Attending: Adult Health | Admitting: Adult Health

## 2020-01-21 ENCOUNTER — Other Ambulatory Visit: Payer: Self-pay

## 2020-01-21 VITALS — BP 136/82 | HR 78 | Wt 219.2 lb

## 2020-01-21 DIAGNOSIS — Z833 Family history of diabetes mellitus: Secondary | ICD-10-CM | POA: Insufficient documentation

## 2020-01-21 DIAGNOSIS — Z794 Long term (current) use of insulin: Secondary | ICD-10-CM | POA: Insufficient documentation

## 2020-01-21 DIAGNOSIS — I480 Paroxysmal atrial fibrillation: Secondary | ICD-10-CM | POA: Diagnosis not present

## 2020-01-21 DIAGNOSIS — Z96651 Presence of right artificial knee joint: Secondary | ICD-10-CM | POA: Insufficient documentation

## 2020-01-21 DIAGNOSIS — Z9989 Dependence on other enabling machines and devices: Secondary | ICD-10-CM | POA: Insufficient documentation

## 2020-01-21 DIAGNOSIS — Z7901 Long term (current) use of anticoagulants: Secondary | ICD-10-CM | POA: Diagnosis not present

## 2020-01-21 DIAGNOSIS — I255 Ischemic cardiomyopathy: Secondary | ICD-10-CM | POA: Diagnosis not present

## 2020-01-21 DIAGNOSIS — M1990 Unspecified osteoarthritis, unspecified site: Secondary | ICD-10-CM | POA: Insufficient documentation

## 2020-01-21 DIAGNOSIS — M109 Gout, unspecified: Secondary | ICD-10-CM | POA: Diagnosis not present

## 2020-01-21 DIAGNOSIS — E119 Type 2 diabetes mellitus without complications: Secondary | ICD-10-CM | POA: Diagnosis not present

## 2020-01-21 DIAGNOSIS — Z853 Personal history of malignant neoplasm of breast: Secondary | ICD-10-CM | POA: Diagnosis not present

## 2020-01-21 DIAGNOSIS — Z950 Presence of cardiac pacemaker: Secondary | ICD-10-CM | POA: Insufficient documentation

## 2020-01-21 DIAGNOSIS — Z87891 Personal history of nicotine dependence: Secondary | ICD-10-CM | POA: Insufficient documentation

## 2020-01-21 DIAGNOSIS — Z888 Allergy status to other drugs, medicaments and biological substances status: Secondary | ICD-10-CM | POA: Diagnosis not present

## 2020-01-21 DIAGNOSIS — I2510 Atherosclerotic heart disease of native coronary artery without angina pectoris: Secondary | ICD-10-CM | POA: Diagnosis not present

## 2020-01-21 DIAGNOSIS — Z8249 Family history of ischemic heart disease and other diseases of the circulatory system: Secondary | ICD-10-CM | POA: Insufficient documentation

## 2020-01-21 DIAGNOSIS — Z79899 Other long term (current) drug therapy: Secondary | ICD-10-CM | POA: Diagnosis not present

## 2020-01-21 DIAGNOSIS — Z882 Allergy status to sulfonamides status: Secondary | ICD-10-CM | POA: Insufficient documentation

## 2020-01-21 DIAGNOSIS — G4733 Obstructive sleep apnea (adult) (pediatric): Secondary | ICD-10-CM | POA: Diagnosis not present

## 2020-01-21 DIAGNOSIS — I5042 Chronic combined systolic (congestive) and diastolic (congestive) heart failure: Secondary | ICD-10-CM

## 2020-01-21 DIAGNOSIS — Z9221 Personal history of antineoplastic chemotherapy: Secondary | ICD-10-CM | POA: Diagnosis not present

## 2020-01-21 DIAGNOSIS — E669 Obesity, unspecified: Secondary | ICD-10-CM | POA: Insufficient documentation

## 2020-01-21 DIAGNOSIS — E039 Hypothyroidism, unspecified: Secondary | ICD-10-CM | POA: Diagnosis not present

## 2020-01-21 DIAGNOSIS — I110 Hypertensive heart disease with heart failure: Secondary | ICD-10-CM | POA: Diagnosis not present

## 2020-01-21 DIAGNOSIS — I5030 Unspecified diastolic (congestive) heart failure: Secondary | ICD-10-CM | POA: Diagnosis not present

## 2020-01-21 DIAGNOSIS — Z955 Presence of coronary angioplasty implant and graft: Secondary | ICD-10-CM | POA: Diagnosis not present

## 2020-01-21 DIAGNOSIS — Z923 Personal history of irradiation: Secondary | ICD-10-CM | POA: Diagnosis not present

## 2020-01-21 LAB — BASIC METABOLIC PANEL
Anion gap: 12 (ref 5–15)
BUN: 30 mg/dL — ABNORMAL HIGH (ref 8–23)
CO2: 25 mmol/L (ref 22–32)
Calcium: 9.4 mg/dL (ref 8.9–10.3)
Chloride: 102 mmol/L (ref 98–111)
Creatinine, Ser: 1.73 mg/dL — ABNORMAL HIGH (ref 0.44–1.00)
GFR calc Af Amer: 33 mL/min — ABNORMAL LOW (ref >60)
GFR calc non Af Amer: 29 mL/min — ABNORMAL LOW (ref >60)
Glucose, Bld: 209 mg/dL — ABNORMAL HIGH (ref 70–99)
Potassium: 4.2 mmol/L (ref 3.5–5.1)
Sodium: 139 mmol/L (ref 135–145)

## 2020-01-21 NOTE — Progress Notes (Signed)
Paramedicine Encounter  Met with Danielle [name] in clinic today where she was evaluated by PA, [name] Clegg. Patient reported she was feeling much better and reports she has lost a few pounds. Vitals, labs as noted. No meds changes noted after lab review per PA. Pill box filled accordingly. I will see in one week.     ACTION: Home visit completed Next visit planned for one week

## 2020-01-21 NOTE — Telephone Encounter (Signed)
Error

## 2020-01-21 NOTE — Patient Instructions (Signed)
It was great to see you today! No medication changes are needed at this time.  Your physician recommends that you schedule a follow-up appointment in: 3-4 months with Dr Haroldine Laws  Do the following things EVERYDAY: 1) Weigh yourself in the morning before breakfast. Write it down and keep it in a log. 2) Take your medicines as prescribed 3) Eat low salt foods--Limit salt (sodium) to 2000 mg per day.  4) Stay as active as you can everyday 5) Limit all fluids for the day to less than 2 liters   If you have any questions or concerns before your next appointment please send [place] a message through [place] or call our office at [phone].    TO LEAVE A MESSAGE FOR THE NURSE SELECT OPTION 2, PLEASE LEAVE A MESSAGE INCLUDING: . YOUR NAME . DATE OF BIRTH . CALL BACK NUMBER . REASON FOR CALL**this is important as we prioritize the call backs  YOU WILL RECEIVE A CALL BACK THE SAME DAY AS LONG AS YOU CALL BEFORE 4:00 PM

## 2020-01-22 ENCOUNTER — Telehealth: Payer: Self-pay

## 2020-01-22 NOTE — Telephone Encounter (Signed)
Call placed to pt to f/u on PREP interest Out shopping and not able to speak now. Will return call when she is able.

## 2020-01-28 ENCOUNTER — Other Ambulatory Visit (HOSPITAL_COMMUNITY): Payer: Self-pay

## 2020-01-28 NOTE — Progress Notes (Signed)
Paramedicine Encounter    Patient ID: Danielle [name], female    DOB: 1946-04-14, 74 y.o.   MRN: [id number]   Patient Care Team: Seward Carol, MD as PCP - General (Internal Medicine) Constance Haw, MD as PCP - Electrophysiology (Cardiology) Sueanne Margarita, MD as PCP - Sleep Medicine (Cardiology) Jorge Ny, LCSW as Social Worker (Licensed Clinical Social Worker)  Patient Active Problem List   Diagnosis Date Noted  . Severe nonproliferative diabetic retinopathy of left eye, with macular edema, associated with type 2 diabetes mellitus ([place]) 11/05/2019  . Severe nonproliferative diabetic retinopathy of right eye, with macular edema, associated with type 2 diabetes mellitus ([place]) 11/05/2019  . Retinal hemorrhage of right eye 11/05/2019  . Retinal hemorrhage of left eye 11/05/2019  . Type 2 diabetes mellitus with hyperglycemia, with long-term current use of insulin ([place]) 05/14/2019  . Diabetes mellitus ([place]) 05/14/2019  . 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 Type 2 diabetes mellitus with diabetic polyneuropathy, with long-term current use of insulin ([place]) 05/14/2019  . Encephalopathy acute 05/20/2018  . 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 Acute kidney failure ([place]) 05/15/2018  . Primary localized osteoarthritis of right knee 05/13/2018  . (HFpEF) heart failure with preserved ejection fraction ([place]) 12/20/2017  . Obesity (BMI 30-39.9) 11/11/2017  . OSA (obstructive sleep apnea) 02/18/2017  . Essential hypertension   . Diabetes mellitus without complication ([place])   . Cancer ([place])   . Thyroid disease     Current Outpatient Medications:  .  acetaminophen (TYLENOL) 500 MG tablet, Take 500-1,000 mg by mouth every 6 (six) hours as needed (for pain or headaches)., Disp: , Rfl:  .  allopurinol (ZYLOPRIM) 100 MG tablet, Take 1 tablet (100 mg total) by mouth daily., Disp: ,  Rfl:  .  apixaban (ELIQUIS) 5 MG TABS tablet, Take 1 tablet (5 mg total) by mouth 2 (two) times daily., Disp: 60 tablet, Rfl: 3 .  atorvastatin (LIPITOR) 80 MG tablet, Take 1 tablet (80 mg total) by mouth at bedtime., Disp: 30 tablet, Rfl: 1 .  Biotin 10 MG CAPS, Take 10 mg by mouth daily. , Disp: , Rfl:  .  Blood Glucose Monitoring Suppl (ACCU-CHEK AVIVA) device, by Other route. Use as instructed, Disp: , Rfl:  .  carvedilol (COREG) 25 MG tablet, Take 1 tablet (25 mg total) by mouth 2 (two) times daily with a meal., Disp: 30 tablet, Rfl: 1 .  colchicine 0.6 MG tablet, TAKE 1 TABLET BY MOUTH EVERY OTHER DAY TO PREVENT FLARES, Disp: , Rfl:  .  empagliflozin (JARDIANCE) 10 MG TABS tablet, Take 1 tablet (10 mg total) by mouth daily., Disp: 30 tablet, Rfl: 1 .  ferrous sulfate 325 (65 FE) MG tablet, Take 1 tablet (325 mg total) by mouth daily with breakfast., Disp: 90 tablet, Rfl: 3 .  gabapentin (NEURONTIN) 300 MG capsule, Take 300 mg by mouth daily. , Disp: , Rfl:  .  glucose blood test strip, Use as instructed to test blood sugars 3 times daily E11.65  Freestyle lite, Disp: 400 each, Rfl: 3 .  insulin glargine (LANTUS) 100 UNIT/ML injection, Inject 88 Units into the skin daily., Disp: , Rfl:  .  insulin lispro (HUMALOG KWIKPEN) 200 UNIT/ML KwikPen, Inject 28 Units into the skin 3 (three) times daily., Disp: 30 mL, Rfl: 6 .  Insulin Pen Needle 32G X 4 MM MISC, 1 Device by Does not  apply route 4 (four) times daily., Disp: 400 each, Rfl: 3 .  isosorbide mononitrate (IMDUR) 30 MG 24 hr tablet, TAKE 1 TABLET BY MOUTH ONCE DAILY, Disp: 30 tablet, Rfl: 1 .  levothyroxine (SYNTHROID, LEVOTHROID) 75 MCG tablet, Take 75 mcg by mouth daily before breakfast., Disp: , Rfl:  .  losartan (COZAAR) 50 MG tablet, TAKE 1 TABLET BY MOUTH AT BEDTIME, Disp: 30 tablet, Rfl: 1 .  Multiple Vitamins-Minerals (ALIVE ONCE DAILY WOMENS 50+ PO), Take 1 tablet by mouth daily., Disp: , Rfl:  .  nitroGLYCERIN (NITROSTAT) 0.4 MG SL  tablet, DISSOLVE ONE TABLET UNDER THE TONGUE EVERY 5 MINUTES AS NEEDED FOR CHEST PAIN., Disp: 25 tablet, Rfl: 3 .  Omega-3 Fatty Acids (FISH OIL PO), Take 1 capsule by mouth daily., Disp: , Rfl:  .  potassium chloride (KLOR-CON) 10 MEQ tablet, Take 2 tablets (20 mEq total) by mouth daily., Disp: 60 tablet, Rfl: 1 .  spironolactone (ALDACTONE) 25 MG tablet, Take 1 tablet (25 mg total) by mouth daily., Disp: 30 tablet, Rfl: 3 .  torsemide (DEMADEX) 20 MG tablet, Take 2 tablets (40 mg total) by mouth daily., Disp: 60 tablet, Rfl: 3 .  Travoprost, BAK Free, (TRAVATAN) 0.004 % SOLN ophthalmic solution, Place 1 drop into both eyes at bedtime. , Disp: , Rfl:  Allergies  Allergen Reactions  . Other Other (See Comments)    Blood products- Patient won't accept any  . Sulfa Antibiotics Rash  . Uloric [Febuxostat] Rash     Social History   Socioeconomic History  . Marital status: Widowed    Spouse name: Not on file  . Number of children: 3  . Years of education: Not on file  . Highest education level: Some college, no degree  Occupational History  . Occupation: retired  Tobacco Use  . Smoking status: Former Smoker    Packs/day: 1.00    Years: 22.00    Pack years: 22.00    Types: Cigarettes    Quit date: 1983    Years since quitting: 38.6  . Smokeless tobacco: Never Used  Vaping Use  . Vaping Use: Never used  Substance and Sexual Activity  . Alcohol use: Yes    Comment: 05/22/2018 "glass of wine couple times/wk"  . Drug use: Not Currently  . Sexual activity: Not Currently    Birth control/protection: None, Post-menopausal  Other Topics Concern  . Not on file  Social History Narrative  . Not on file   Social Determinants of Health   Financial Resource Strain:   . Difficulty of Paying Living Expenses: Not on file  Food Insecurity:   . Worried About Charity fundraiser in the Last Year: Not on file  . Ran Out of Food in the Last Year: Not on file  Transportation Needs:   . Lack  of Transportation (Medical): Not on file  . Lack of Transportation (Non-Medical): Not on file  Physical Activity:   . Days of Exercise per Week: Not on file  . Minutes of Exercise per Session: Not on file  Stress:   . Feeling of Stress : Not on file  Social Connections:   . Frequency of Communication with Friends and Family: Not on file  . Frequency of Social Gatherings with Friends and Family: Not on file  . Attends Religious Services: Not on file  . Active Member of Clubs or Organizations: Not on file  . Attends Archivist Meetings: Not on file  . Marital Status: Not on  file  Intimate Partner Violence:   . Fear of Current or Ex-Partner: Not on file  . Emotionally Abused: Not on file  . Physically Abused: Not on file  . Sexually Abused: Not on file    Physical Exam Vitals reviewed.  Constitutional:      Appearance: She is normal weight.  HENT:     Head: Normocephalic.     Nose: Nose normal.     Mouth/Throat:     Mouth: Mucous membranes are moist.     Pharynx: Oropharynx is clear.  Eyes:     Pupils: Pupils are equal, round, and reactive to light.  Cardiovascular:     Rate and Rhythm: Normal rate and regular rhythm.     Pulses: Normal pulses.     Heart sounds: Normal heart sounds.  Pulmonary:     Effort: Pulmonary effort is normal.     Breath sounds: Normal breath sounds.  Abdominal:     General: Abdomen is flat.     Palpations: Abdomen is soft.     Comments: Constipation.   Musculoskeletal:        General: Normal range of motion.     Cervical back: Normal range of motion.     Right lower leg: No edema.     Left lower leg: No edema.  Skin:    General: Skin is warm and dry.     Capillary Refill: Capillary refill takes less than 2 seconds.  Neurological:     General: No focal deficit present.     Mental Status: She is alert. Mental status is at baseline.  Psychiatric:        Mood and Affect: Mood normal.     Arrived for home visit for Danielle [name] who was  alert and oriented ambulating around her home with no shortness of breath reporting she is feeling okay other than having some constipation. Danielle [name] was instructed by [name], PA in the heart failure clinic to start an over the counter stool softner which patient has not started yet, I reminded patient of same and the importance of trying this before notifying her PCP. Danielle [name] agreed. Vitals obtained. Aijalon reports she has been going to see chiropractor and it is helping with her back pain and her mobility. Medications were reviewed and confirmed. Pill box filled accordingly. Danielle [name] agreed to visit in one week. Home visit complete.   Fasting CBG- 215   Refills: Isosorbide Jardiance Losartan    Future Appointments  Date Time Provider [place]  02/05/2020  9:00 AM Waynetta Sandy, MD [facility]  02/10/2020 12:30 PM [facility] MM 2 [facility]  03/01/2020  7:20 AM [facility] DEVICE REMOTES [facility]  04/04/2020 10:30 AM Rankin, Clent Demark, MD [facility] None  04/15/2020 10:10 AM Shamleffer, Melanie Crazier, MD [facility] None  04/25/2020 11:20 AM Bensimhon, Shaune Pascal, MD [facility] None     ACTION: Home visit completed Next visit planned for one week

## 2020-02-04 ENCOUNTER — Other Ambulatory Visit (HOSPITAL_COMMUNITY): Payer: Self-pay

## 2020-02-04 NOTE — Progress Notes (Signed)
Paramedicine Encounter    Patient ID: Danielle [name], female    DOB: 1946-04-14, 74 y.o.   MRN: [id number]   Patient Care Team: Seward Carol, MD as PCP - General (Internal Medicine) Constance Haw, MD as PCP - Electrophysiology (Cardiology) Sueanne Margarita, MD as PCP - Sleep Medicine (Cardiology) Jorge Ny, LCSW as Social Worker (Licensed Clinical Social Worker)  Patient Active Problem List   Diagnosis Date Noted  . Severe nonproliferative diabetic retinopathy of left eye, with macular edema, associated with type 2 diabetes mellitus ([place]) 11/05/2019  . Severe nonproliferative diabetic retinopathy of right eye, with macular edema, associated with type 2 diabetes mellitus ([place]) 11/05/2019  . Retinal hemorrhage of right eye 11/05/2019  . Retinal hemorrhage of left eye 11/05/2019  . Type 2 diabetes mellitus with hyperglycemia, with long-term current use of insulin ([place]) 05/14/2019  . Diabetes mellitus ([place]) 05/14/2019  . 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 Type 2 diabetes mellitus with diabetic polyneuropathy, with long-term current use of insulin ([place]) 05/14/2019  . Encephalopathy acute 05/20/2018  . 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 Acute kidney failure ([place]) 05/15/2018  . Primary localized osteoarthritis of right knee 05/13/2018  . (HFpEF) heart failure with preserved ejection fraction ([place]) 12/20/2017  . Obesity (BMI 30-39.9) 11/11/2017  . OSA (obstructive sleep apnea) 02/18/2017  . Essential hypertension   . Diabetes mellitus without complication ([place])   . Cancer ([place])   . Thyroid disease     Current Outpatient Medications:  .  acetaminophen (TYLENOL) 500 MG tablet, Take 500-1,000 mg by mouth every 6 (six) hours as needed (for pain or headaches)., Disp: , Rfl:  .  allopurinol (ZYLOPRIM) 100 MG tablet, Take 1 tablet (100 mg total) by mouth daily., Disp: ,  Rfl:  .  apixaban (ELIQUIS) 5 MG TABS tablet, Take 1 tablet (5 mg total) by mouth 2 (two) times daily., Disp: 60 tablet, Rfl: 3 .  atorvastatin (LIPITOR) 80 MG tablet, Take 1 tablet (80 mg total) by mouth at bedtime., Disp: 30 tablet, Rfl: 1 .  Biotin 10 MG CAPS, Take 10 mg by mouth daily. , Disp: , Rfl:  .  Blood Glucose Monitoring Suppl (ACCU-CHEK AVIVA) device, by Other route. Use as instructed, Disp: , Rfl:  .  carvedilol (COREG) 25 MG tablet, Take 1 tablet (25 mg total) by mouth 2 (two) times daily with a meal., Disp: 30 tablet, Rfl: 1 .  colchicine 0.6 MG tablet, TAKE 1 TABLET BY MOUTH EVERY OTHER DAY TO PREVENT FLARES, Disp: , Rfl:  .  empagliflozin (JARDIANCE) 10 MG TABS tablet, Take 1 tablet (10 mg total) by mouth daily., Disp: 30 tablet, Rfl: 1 .  ferrous sulfate 325 (65 FE) MG tablet, Take 1 tablet (325 mg total) by mouth daily with breakfast., Disp: 90 tablet, Rfl: 3 .  gabapentin (NEURONTIN) 300 MG capsule, Take 300 mg by mouth daily. , Disp: , Rfl:  .  glucose blood test strip, Use as instructed to test blood sugars 3 times daily E11.65  Freestyle lite, Disp: 400 each, Rfl: 3 .  insulin glargine (LANTUS) 100 UNIT/ML injection, Inject 88 Units into the skin daily., Disp: , Rfl:  .  insulin lispro (HUMALOG KWIKPEN) 200 UNIT/ML KwikPen, Inject 28 Units into the skin 3 (three) times daily., Disp: 30 mL, Rfl: 6 .  Insulin Pen Needle 32G X 4 MM MISC, 1 Device by Does not  apply route 4 (four) times daily., Disp: 400 each, Rfl: 3 .  isosorbide mononitrate (IMDUR) 30 MG 24 hr tablet, TAKE 1 TABLET BY MOUTH ONCE DAILY, Disp: 30 tablet, Rfl: 1 .  levothyroxine (SYNTHROID, LEVOTHROID) 75 MCG tablet, Take 75 mcg by mouth daily before breakfast., Disp: , Rfl:  .  losartan (COZAAR) 50 MG tablet, TAKE 1 TABLET BY MOUTH AT BEDTIME, Disp: 30 tablet, Rfl: 1 .  Multiple Vitamins-Minerals (ALIVE ONCE DAILY WOMENS 50+ PO), Take 1 tablet by mouth daily., Disp: , Rfl:  .  nitroGLYCERIN (NITROSTAT) 0.4 MG SL  tablet, DISSOLVE ONE TABLET UNDER THE TONGUE EVERY 5 MINUTES AS NEEDED FOR CHEST PAIN. (Patient not taking: Reported on 01/28/2020), Disp: 25 tablet, Rfl: 3 .  Omega-3 Fatty Acids (FISH OIL PO), Take 1 capsule by mouth daily., Disp: , Rfl:  .  potassium chloride (KLOR-CON) 10 MEQ tablet, Take 2 tablets (20 mEq total) by mouth daily., Disp: 60 tablet, Rfl: 1 .  spironolactone (ALDACTONE) 25 MG tablet, Take 1 tablet (25 mg total) by mouth daily., Disp: 30 tablet, Rfl: 3 .  torsemide (DEMADEX) 20 MG tablet, Take 2 tablets (40 mg total) by mouth daily., Disp: 60 tablet, Rfl: 3 .  Travoprost, BAK Free, (TRAVATAN) 0.004 % SOLN ophthalmic solution, Place 1 drop into both eyes at bedtime. , Disp: , Rfl:  Allergies  Allergen Reactions  . Other Other (See Comments)    Blood products- Patient won't accept any  . Sulfa Antibiotics Rash  . Uloric [Febuxostat] Rash     Social History   Socioeconomic History  . Marital status: Widowed    Spouse name: Not on file  . Number of children: 3  . Years of education: Not on file  . Highest education level: Some college, no degree  Occupational History  . Occupation: retired  Tobacco Use  . Smoking status: Former Smoker    Packs/day: 1.00    Years: 22.00    Pack years: 22.00    Types: Cigarettes    Quit date: 1983    Years since quitting: 38.7  . Smokeless tobacco: Never Used  Vaping Use  . Vaping Use: Never used  Substance and Sexual Activity  . Alcohol use: Yes    Comment: 05/22/2018 "glass of wine couple times/wk"  . Drug use: Not Currently  . Sexual activity: Not Currently    Birth control/protection: None, Post-menopausal  Other Topics Concern  . Not on file  Social History Narrative  . Not on file   Social Determinants of Health   Financial Resource Strain:   . Difficulty of Paying Living Expenses: Not on file  Food Insecurity:   . Worried About Charity fundraiser in the Last Year: Not on file  . Ran Out of Food in the Last Year:  Not on file  Transportation Needs:   . Lack of Transportation (Medical): Not on file  . Lack of Transportation (Non-Medical): Not on file  Physical Activity:   . Days of Exercise per Week: Not on file  . Minutes of Exercise per Session: Not on file  Stress:   . Feeling of Stress : Not on file  Social Connections:   . Frequency of Communication with Friends and Family: Not on file  . Frequency of Social Gatherings with Friends and Family: Not on file  . Attends Religious Services: Not on file  . Active Member of Clubs or Organizations: Not on file  . Attends Archivist Meetings: Not on file  .  Marital Status: Not on file  Intimate Partner Violence:   . Fear of Current or Ex-Partner: Not on file  . Emotionally Abused: Not on file  . Physically Abused: Not on file  . Sexually Abused: Not on file    Physical Exam Vitals reviewed.  Constitutional:      Appearance: She is normal weight.  HENT:     Head: Normocephalic.     Nose: Nose normal.     Mouth/Throat:     Mouth: Mucous membranes are moist.  Eyes:     Pupils: Pupils are equal, round, and reactive to light.  Cardiovascular:     Rate and Rhythm: Normal rate and regular rhythm.     Pulses: Normal pulses.     Heart sounds: Normal heart sounds.  Pulmonary:     Effort: Pulmonary effort is normal.     Breath sounds: Normal breath sounds.  Abdominal:     General: Abdomen is flat.     Palpations: Abdomen is soft.  Musculoskeletal:        General: Normal range of motion.     Cervical back: Normal range of motion.     Right lower leg: No edema.     Left lower leg: No edema.  Skin:    General: Skin is warm and dry.     Capillary Refill: Capillary refill takes less than 2 seconds.  Neurological:     Mental Status: She is alert. Mental status is at baseline.  Psychiatric:        Mood and Affect: Mood normal.     Arrived for home visit for [place] who was alert and oriented reporting she was having some  generalized back pain after visiting chiropractor. Vedika reports she is having another visit with chiropractor again today. Vitals were obtained, meds were reviewed and pill box filled accordingly. Louella denies shortness of breath, dizziness, chest pain. Ivis agreed to visit in one week.   Refills: Losartan Arlyce Harman Atorvastatin Eliquis        Future Appointments  Date Time Provider [place]  02/05/2020  9:00 AM Waynetta Sandy, MD [facility]  02/10/2020 12:30 PM [facility] MM 2 [facility]  03/01/2020  7:20 AM [facility] DEVICE REMOTES [facility]  04/04/2020 10:30 AM Rankin, Clent Demark, MD [facility] None  04/15/2020 10:10 AM Shamleffer, Melanie Crazier, MD [facility] None  04/25/2020 11:20 AM Bensimhon, Shaune Pascal, MD [facility] None     ACTION: Home visit completed Next visit planned for one week

## 2020-02-05 ENCOUNTER — Ambulatory Visit (INDEPENDENT_AMBULATORY_CARE_PROVIDER_SITE_OTHER): Payer: Medicare HMO | Admitting: Vascular Surgery

## 2020-02-05 ENCOUNTER — Other Ambulatory Visit: Payer: Self-pay

## 2020-02-05 ENCOUNTER — Encounter: Payer: Self-pay | Admitting: Vascular Surgery

## 2020-02-05 VITALS — BP 131/78 | HR 62 | Temp 97.8°F | Resp 20 | Ht 64.0 in | Wt 222.0 lb

## 2020-02-05 DIAGNOSIS — I739 Peripheral vascular disease, unspecified: Secondary | ICD-10-CM

## 2020-02-05 NOTE — Progress Notes (Signed)
Patient ID: Tiwana Chavis, female   DOB: 09-25-1945, 74 y.o.   MRN: [id number]  Reason for Consult: New Patient (Initial Visit)   Referred by Seward Carol, MD  Subjective:     HPI:  Omayra Tulloch is a 74 y.o. female with history of diabetes and congestive heart failure as well as coronary artery disease sent for evaluation of PAD with left greater than right foot pain.  Recently had discoloration of her left second toe.  She states that something hard like a callus or bone was removed the toe was subsequently improved and feeling.  She does have occasional numbness in her feet.  She walks with the help of walker.  She does not have any tissue loss or ulceration.  She does have stable bilateral lower extremity swelling that was exacerbated with bilateral knee replacement several years ago.  She also has left upper extremity swelling secondary to previous breast cancer surgery.  Current most pressing issue is left hip pain for which she is being evaluated by a chiropractor.  Past Medical History:  Diagnosis Date  . Acute combined systolic and diastolic congestive heart failure ([place]) 02/05/2017  . 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 Acute kidney failure ([place]) 05/15/2018  . Arthritis    "all over" (05/22/2018)  . Blood dyscrasia    per pt-has small blood cells-appears as if anemic  . Breast cancer, left breast ([place]) 1985  . CHF (congestive heart failure) ([place])   . Complication of anesthesia    difficult to awaken from per pt  . Coronary artery disease   . Dyspnea   . Essential hypertension   . Heart disease   . Heart murmur   . History of gout   . Hypothyroidism   . Obesity (BMI 30-39.9) 11/11/2017  . OSA (obstructive sleep apnea) 02/18/2017    severe obstructive sleep apnea with an AHI of 75.6/h and mild central sleep apnea with a CAI of 7.7/h.  Oxygen saturations dropped as low as 82%.   He is on CPAP at 9 cm H2O.  . OSA on CPAP   . Personal  history of chemotherapy   . Personal history of radiation therapy   . Presence of permanent cardiac pacemaker   . Primary localized osteoarthritis of right knee 05/13/2018  . Refusal of blood transfusions as patient is Jehovah's Witness   . Type II diabetes mellitus ([facility])    Family History  Problem Relation Age of Onset  . Multiple myeloma Mother   . Heart disease Father   . Other Sister   . Heart disease Brother   . Diabetes Sister   . Other Sister    Past Surgical History:  Procedure Laterality Date  . BREAST BIOPSY Left 1985  . BREAST LUMPECTOMY WITH NEEDLE LOCALIZATION AND AXILLARY LYMPH NODE DISSECTION  1985  . CATARACT EXTRACTION W/ INTRAOCULAR LENS  IMPLANT, BILATERAL Bilateral   . CORONARY ANGIOPLASTY WITH STENT PLACEMENT  2010, 2012,2017    2 done 2010 and 1 replaced 2012 and 2 replaced in 2017  . DILATION AND CURETTAGE OF UTERUS    . INSERT / REPLACE / REMOVE PACEMAKER  07/19/2014  . JOINT REPLACEMENT    . REPLACEMENT TOTAL KNEE Left 09/2012  . THYROIDECTOMY, PARTIAL  1978  . TONSILLECTOMY    . TOTAL KNEE ARTHROPLASTY Right 05/13/2018   Procedure: TOTAL KNEE ARTHROPLASTY;  Surgeon: Marchia Bond, MD;  Location: [facility];  Service: Orthopedics;  Laterality: Right;  .  TUBAL LIGATION      Short Social History:  Social History   Tobacco Use  . Smoking status: Former Smoker    Packs/day: 1.00    Years: 22.00    Pack years: 22.00    Types: Cigarettes    Quit date: 1983    Years since quitting: 38.7  . Smokeless tobacco: Never Used  Substance Use Topics  . Alcohol use: Yes    Comment: 05/22/2018 "glass of wine couple times/wk"    Allergies  Allergen Reactions  . Other Other (See Comments)    Blood products- Patient won't accept any  . Sulfa Antibiotics Rash  . Uloric [Febuxostat] Rash    Current Outpatient Medications  Medication Sig Dispense Refill  . acetaminophen (TYLENOL) 500 MG tablet Take 500-1,000 mg by mouth every 6 (six) hours as needed (for pain  or headaches).    Marland Kitchen allopurinol (ZYLOPRIM) 100 MG tablet Take 1 tablet (100 mg total) by mouth daily.    Marland Kitchen apixaban (ELIQUIS) 5 MG TABS tablet Take 1 tablet (5 mg total) by mouth 2 (two) times daily. 60 tablet 3  . atorvastatin (LIPITOR) 80 MG tablet Take 1 tablet (80 mg total) by mouth at bedtime. 30 tablet 1  . Biotin 10 MG CAPS Take 10 mg by mouth daily.     . Blood Glucose Monitoring Suppl (ACCU-CHEK AVIVA) device by Other route. Use as instructed    . carvedilol (COREG) 25 MG tablet Take 1 tablet (25 mg total) by mouth 2 (two) times daily with a meal. 30 tablet 1  . colchicine 0.6 MG tablet TAKE 1 TABLET BY MOUTH EVERY OTHER DAY TO PREVENT FLARES    . empagliflozin (JARDIANCE) 10 MG TABS tablet Take 1 tablet (10 mg total) by mouth daily. 30 tablet 1  . ferrous sulfate 325 (65 FE) MG tablet Take 1 tablet (325 mg total) by mouth daily with breakfast. 90 tablet 3  . gabapentin (NEURONTIN) 300 MG capsule Take 300 mg by mouth daily.     Marland Kitchen glucose blood test strip Use as instructed to test blood sugars 3 times daily E11.65  Freestyle lite 400 each 3  . insulin glargine (LANTUS) 100 UNIT/ML injection Inject 88 Units into the skin daily.    . insulin lispro (HUMALOG KWIKPEN) 200 UNIT/ML KwikPen Inject 28 Units into the skin 3 (three) times daily. 30 mL 6  . Insulin Pen Needle 32G X 4 MM MISC 1 Device by Does not apply route 4 (four) times daily. 400 each 3  . isosorbide mononitrate (IMDUR) 30 MG 24 hr tablet TAKE 1 TABLET BY MOUTH ONCE DAILY 30 tablet 1  . levothyroxine (SYNTHROID, LEVOTHROID) 75 MCG tablet Take 75 mcg by mouth daily before breakfast.    . losartan (COZAAR) 50 MG tablet TAKE 1 TABLET BY MOUTH AT BEDTIME 30 tablet 1  . Multiple Vitamins-Minerals (ALIVE ONCE DAILY WOMENS 50+ PO) Take 1 tablet by mouth daily.    . nitroGLYCERIN (NITROSTAT) 0.4 MG SL tablet DISSOLVE ONE TABLET UNDER THE TONGUE EVERY 5 MINUTES AS NEEDED FOR CHEST PAIN. 25 tablet 3  . Omega-3 Fatty Acids (FISH OIL PO)  Take 1 capsule by mouth daily.    . potassium chloride (KLOR-CON) 10 MEQ tablet Take 2 tablets (20 mEq total) by mouth daily. 60 tablet 1  . spironolactone (ALDACTONE) 25 MG tablet Take 1 tablet (25 mg total) by mouth daily. 30 tablet 3  . torsemide (DEMADEX) 20 MG tablet Take 2 tablets (40 mg total) by  mouth daily. 60 tablet 3  . Travoprost, BAK Free, (TRAVATAN) 0.004 % SOLN ophthalmic solution Place 1 drop into both eyes at bedtime.      No current facility-administered medications for this visit.    Review of Systems  Constitutional:  Constitutional negative. HENT: HENT negative.  Eyes: Eyes negative.  Respiratory: Respiratory negative.  Cardiovascular: Positive for leg swelling.  GI: Gastrointestinal negative.  Musculoskeletal: Positive for leg pain and joint pain.  Skin: Positive for wound.  Neurological: Positive for numbness.  Hematologic: Hematologic/lymphatic negative.  Psychiatric: Psychiatric negative.        Objective:  Objective   Vitals:   02/05/20 0841  BP: 131/78  Pulse: 62  Resp: 20  Temp: 97.8 F (36.6 C)  SpO2: 96%  Weight: 222 lb (100.7 kg)  Height: 5' 4"  (1.626 m)   Body mass index is 38.11 kg/m.  Physical Exam HENT:     Head: Normocephalic.     Nose:     Comments: Wearing a mask Eyes:     Pupils: Pupils are equal, round, and reactive to light.  Neck:     Vascular: No carotid bruit.  Cardiovascular:     Pulses:          Carotid pulses are 2+ on the right side and 2+ on the left side.      Radial pulses are 0 on the right side and 2+ on the left side.       Dorsalis pedis pulses are 2+ on the right side and 2+ on the left side.       Posterior tibial pulses are 0 on the right side and 0 on the left side.  Pulmonary:     Effort: Pulmonary effort is normal.     Breath sounds: Normal breath sounds.  Abdominal:     General: Abdomen is flat.     Palpations: Abdomen is soft.  Musculoskeletal:     Right lower leg: Edema present.     Left  lower leg: Edema present.  Skin:    General: Skin is warm and dry.     Capillary Refill: Capillary refill takes less than 2 seconds.  Neurological:     General: No focal deficit present.     Mental Status: She is alert.  Psychiatric:        Mood and Affect: Mood normal.        Behavior: Behavior normal.        Thought Content: Thought content normal.        Judgment: Judgment normal.     Data: ABI Findings:  +---------+------------------+-----+----------------+--------+  Right  Rt Pressure (mmHg)IndexWaveform    Comment   +---------+------------------+-----+----------------+--------+  Brachial 139                         +---------+------------------+-----+----------------+--------+  CFA               barely triphasic      +---------+------------------+-----+----------------+--------+  Popliteal            biphasic          +---------+------------------+-----+----------------+--------+  ATA   205        1.40 triphasic          +---------+------------------+-----+----------------+--------+  PTA   254        1.74 triphasic          +---------+------------------+-----+----------------+--------+  PERO   173        1.18 triphasic          +---------+------------------+-----+----------------+--------+  Great Toe120        0.82 Normal           +---------+------------------+-----+----------------+--------+   +---------+------------------+-----+-----------+-------+  Left   Lt Pressure (mmHg)IndexWaveform  Comment  +---------+------------------+-----+-----------+-------+  Brachial 146                       +---------+------------------+-----+-----------+-------+  CFA               triphasic       +---------+------------------+-----+-----------+-------+    Popliteal            biphasic        +---------+------------------+-----+-----------+-------+  ATA   177        1.21 triphasic       +---------+------------------+-----+-----------+-------+  PTA   172        1.18 biphasic        +---------+------------------+-----+-----------+-------+  PERO   178        1.22 multiphasic      +---------+------------------+-----+-----------+-------+  Doristine Devoid Toe216        1.48 Normal         +---------+------------------+-----+-----------+-------+      Assessment/Plan:     74 year old diabetic female with recent history of discoloration of her left second toe.  ABIs are normal and dorsalis pedis pulses are palpable.  Thankfully her toe has improved in appearance.  She does have occasional numbness as well.  Her chief complaint today is left hip pain she is being seen by a chiropractor for this.  Ultimately I think she will benefit from podiatry evaluation but she would like to hold on this for now.  She can follow-up with me on an as-needed basis.     Waynetta Sandy MD [facility]

## 2020-02-10 ENCOUNTER — Ambulatory Visit: Payer: Medicare HMO

## 2020-02-11 ENCOUNTER — Other Ambulatory Visit (HOSPITAL_COMMUNITY): Payer: Self-pay

## 2020-02-11 NOTE — Progress Notes (Signed)
Paramedicine Encounter    Patient ID: Danielle [name], female    DOB: 1946-04-14, 74 y.o.   MRN: [id number]   Patient Care Team: Seward Carol, MD as PCP - General (Internal Medicine) Constance Haw, MD as PCP - Electrophysiology (Cardiology) Sueanne Margarita, MD as PCP - Sleep Medicine (Cardiology) Jorge Ny, LCSW as Social Worker (Licensed Clinical Social Worker)  Patient Active Problem List   Diagnosis Date Noted  . Severe nonproliferative diabetic retinopathy of left eye, with macular edema, associated with type 2 diabetes mellitus ([place]) 11/05/2019  . Severe nonproliferative diabetic retinopathy of right eye, with macular edema, associated with type 2 diabetes mellitus ([place]) 11/05/2019  . Retinal hemorrhage of right eye 11/05/2019  . Retinal hemorrhage of left eye 11/05/2019  . Type 2 diabetes mellitus with hyperglycemia, with long-term current use of insulin ([place]) 05/14/2019  . Diabetes mellitus ([place]) 05/14/2019  . 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 Type 2 diabetes mellitus with diabetic polyneuropathy, with long-term current use of insulin ([place]) 05/14/2019  . Encephalopathy acute 05/20/2018  . 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 Acute kidney failure ([place]) 05/15/2018  . Primary localized osteoarthritis of right knee 05/13/2018  . (HFpEF) heart failure with preserved ejection fraction ([place]) 12/20/2017  . Obesity (BMI 30-39.9) 11/11/2017  . OSA (obstructive sleep apnea) 02/18/2017  . Essential hypertension   . Diabetes mellitus without complication ([place])   . Cancer ([place])   . Thyroid disease     Current Outpatient Medications:  .  acetaminophen (TYLENOL) 500 MG tablet, Take 500-1,000 mg by mouth every 6 (six) hours as needed (for pain or headaches)., Disp: , Rfl:  .  allopurinol (ZYLOPRIM) 100 MG tablet, Take 1 tablet (100 mg total) by mouth daily., Disp: ,  Rfl:  .  apixaban (ELIQUIS) 5 MG TABS tablet, Take 1 tablet (5 mg total) by mouth 2 (two) times daily., Disp: 60 tablet, Rfl: 3 .  atorvastatin (LIPITOR) 80 MG tablet, Take 1 tablet (80 mg total) by mouth at bedtime., Disp: 30 tablet, Rfl: 1 .  Biotin 10 MG CAPS, Take 10 mg by mouth daily. , Disp: , Rfl:  .  Blood Glucose Monitoring Suppl (ACCU-CHEK AVIVA) device, by Other route. Use as instructed, Disp: , Rfl:  .  carvedilol (COREG) 25 MG tablet, Take 1 tablet (25 mg total) by mouth 2 (two) times daily with a meal., Disp: 30 tablet, Rfl: 1 .  colchicine 0.6 MG tablet, TAKE 1 TABLET BY MOUTH EVERY OTHER DAY TO PREVENT FLARES, Disp: , Rfl:  .  empagliflozin (JARDIANCE) 10 MG TABS tablet, Take 1 tablet (10 mg total) by mouth daily., Disp: 30 tablet, Rfl: 1 .  ferrous sulfate 325 (65 FE) MG tablet, Take 1 tablet (325 mg total) by mouth daily with breakfast., Disp: 90 tablet, Rfl: 3 .  gabapentin (NEURONTIN) 300 MG capsule, Take 300 mg by mouth daily. , Disp: , Rfl:  .  glucose blood test strip, Use as instructed to test blood sugars 3 times daily E11.65  Freestyle lite, Disp: 400 each, Rfl: 3 .  insulin glargine (LANTUS) 100 UNIT/ML injection, Inject 88 Units into the skin daily., Disp: , Rfl:  .  insulin lispro (HUMALOG KWIKPEN) 200 UNIT/ML KwikPen, Inject 28 Units into the skin 3 (three) times daily., Disp: 30 mL, Rfl: 6 .  Insulin Pen Needle 32G X 4 MM MISC, 1 Device by Does not  apply route 4 (four) times daily., Disp: 400 each, Rfl: 3 .  isosorbide mononitrate (IMDUR) 30 MG 24 hr tablet, TAKE 1 TABLET BY MOUTH ONCE DAILY, Disp: 30 tablet, Rfl: 1 .  levothyroxine (SYNTHROID, LEVOTHROID) 75 MCG tablet, Take 75 mcg by mouth daily before breakfast., Disp: , Rfl:  .  losartan (COZAAR) 50 MG tablet, TAKE 1 TABLET BY MOUTH AT BEDTIME, Disp: 30 tablet, Rfl: 1 .  Multiple Vitamins-Minerals (ALIVE ONCE DAILY WOMENS 50+ PO), Take 1 tablet by mouth daily., Disp: , Rfl:  .  nitroGLYCERIN (NITROSTAT) 0.4 MG SL  tablet, DISSOLVE ONE TABLET UNDER THE TONGUE EVERY 5 MINUTES AS NEEDED FOR CHEST PAIN., Disp: 25 tablet, Rfl: 3 .  Omega-3 Fatty Acids (FISH OIL PO), Take 1 capsule by mouth daily., Disp: , Rfl:  .  potassium chloride (KLOR-CON) 10 MEQ tablet, Take 2 tablets (20 mEq total) by mouth daily., Disp: 60 tablet, Rfl: 1 .  spironolactone (ALDACTONE) 25 MG tablet, Take 1 tablet (25 mg total) by mouth daily., Disp: 30 tablet, Rfl: 3 .  torsemide (DEMADEX) 20 MG tablet, Take 2 tablets (40 mg total) by mouth daily., Disp: 60 tablet, Rfl: 3 .  Travoprost, BAK Free, (TRAVATAN) 0.004 % SOLN ophthalmic solution, Place 1 drop into both eyes at bedtime. , Disp: , Rfl:  Allergies  Allergen Reactions  . Other Other (See Comments)    Blood products- Patient won't accept any  . Sulfa Antibiotics Rash  . Uloric [Febuxostat] Rash     Social History   Socioeconomic History  . Marital status: Widowed    Spouse name: Not on file  . Number of children: 3  . Years of education: Not on file  . Highest education level: Some college, no degree  Occupational History  . Occupation: retired  Tobacco Use  . Smoking status: Former Smoker    Packs/day: 1.00    Years: 22.00    Pack years: 22.00    Types: Cigarettes    Quit date: 1983    Years since quitting: 38.7  . Smokeless tobacco: Never Used  Vaping Use  . Vaping Use: Never used  Substance and Sexual Activity  . Alcohol use: Yes    Comment: 05/22/2018 "glass of wine couple times/wk"  . Drug use: Not Currently  . Sexual activity: Not Currently    Birth control/protection: None, Post-menopausal  Other Topics Concern  . Not on file  Social History Narrative  . Not on file   Social Determinants of Health   Financial Resource Strain:   . Difficulty of Paying Living Expenses: Not on file  Food Insecurity:   . Worried About Charity fundraiser in the Last Year: Not on file  . Ran Out of Food in the Last Year: Not on file  Transportation Needs:   . Lack  of Transportation (Medical): Not on file  . Lack of Transportation (Non-Medical): Not on file  Physical Activity:   . Days of Exercise per Week: Not on file  . Minutes of Exercise per Session: Not on file  Stress:   . Feeling of Stress : Not on file  Social Connections:   . Frequency of Communication with Friends and Family: Not on file  . Frequency of Social Gatherings with Friends and Family: Not on file  . Attends Religious Services: Not on file  . Active Member of Clubs or Organizations: Not on file  . Attends Archivist Meetings: Not on file  . Marital Status: Not on  file  Intimate Partner Violence:   . Fear of Current or Ex-Partner: Not on file  . Emotionally Abused: Not on file  . Physically Abused: Not on file  . Sexually Abused: Not on file    Physical Exam Vitals reviewed.  Constitutional:      Appearance: She is normal weight.  HENT:     Head: Normocephalic.     Nose: Nose normal.     Mouth/Throat:     Mouth: Mucous membranes are moist.     Pharynx: Oropharynx is clear.  Eyes:     Pupils: Pupils are equal, round, and reactive to light.  Cardiovascular:     Rate and Rhythm: Normal rate and regular rhythm.     Pulses: Normal pulses.     Heart sounds: Normal heart sounds.  Pulmonary:     Effort: Pulmonary effort is normal.     Breath sounds: Normal breath sounds.  Abdominal:     General: Abdomen is flat.     Palpations: Abdomen is soft.  Musculoskeletal:        General: Normal range of motion.     Cervical back: Normal range of motion.     Right lower leg: No edema.     Left lower leg: No edema.  Skin:    General: Skin is warm and dry.     Capillary Refill: Capillary refill takes less than 2 seconds.  Neurological:     General: No focal deficit present.     Mental Status: She is alert. Mental status is at baseline.  Psychiatric:        Mood and Affect: Mood normal.     Arrived for home visit for Crump who was alert and oriented reporting  she was feeling really good and she was not in pain today and believes her chiropractor visits are helping with her pain as well as her bowel movements being more regular. Kanasia's meds were verified and confirmed. 2 pill boxes filled accordingly. Multiple refills needed in order for second pill box to be complete. I will call in same and come out next week to complete filling box. Moni agreed with same. Vitals as noted. Weight down this week! Brooke is happy to be making improvements. Home visit complete. Will be discussing possible discharge options from paramedicine soon.   Refills- Gabapentin  Torsemide Eliquis Atorvastatin Spironolactone  Isosorbide     Future Appointments  Date Time Provider [place]  02/23/2020  3:10 PM [facility] MM 3 [facility]  03/01/2020  7:20 AM [facility] DEVICE REMOTES [facility]  04/04/2020 10:30 AM Rankin, Clent Demark, MD [facility] None  04/15/2020 10:10 AM Shamleffer, Melanie Crazier, MD [facility] None  04/25/2020 11:20 AM Bensimhon, Shaune Pascal, MD [facility] None     ACTION: Home visit completed Next visit planned for two weeks

## 2020-02-22 ENCOUNTER — Telehealth (HOSPITAL_COMMUNITY): Payer: Self-pay | Admitting: *Deleted

## 2020-02-22 NOTE — Telephone Encounter (Signed)
Returned call from 9.24 to answer patient questions regarding her PREP referral that would start in October. She is awaiting a call from the [facility] coach to schedule her initial intake. She was provided information for when and what would be involved with the class. Message sent to [place] coach. Patient understands the process and will reach out if other questions arise.     Landis Martins, MS, ACSM, [facility] Clinical Exercise Physiologist/ Health and Wellness Coach

## 2020-02-23 ENCOUNTER — Other Ambulatory Visit: Payer: Self-pay

## 2020-02-23 ENCOUNTER — Ambulatory Visit
Admission: RE | Admit: 2020-02-23 | Discharge: 2020-02-23 | Disposition: A | Discharge status: Home / Self Care | Payer: Medicare HMO | Source: Ambulatory Visit | Attending: Internal Medicine | Admitting: Internal Medicine

## 2020-02-23 DIAGNOSIS — Z1231 Encounter for screening mammogram for malignant neoplasm of breast: Secondary | ICD-10-CM

## 2020-02-25 ENCOUNTER — Other Ambulatory Visit (HOSPITAL_COMMUNITY): Payer: Self-pay

## 2020-02-25 NOTE — Progress Notes (Signed)
Paramedicine Encounter    Patient ID: Danielle [name], female    DOB: 1946-04-14, 74 y.o.   MRN: [id number]   Patient Care Team: Seward Carol, MD as PCP - General (Internal Medicine) Constance Haw, MD as PCP - Electrophysiology (Cardiology) Sueanne Margarita, MD as PCP - Sleep Medicine (Cardiology) Jorge Ny, LCSW as Social Worker (Licensed Clinical Social Worker)  Patient Active Problem List   Diagnosis Date Noted  . Severe nonproliferative diabetic retinopathy of left eye, with macular edema, associated with type 2 diabetes mellitus ([place]) 11/05/2019  . Severe nonproliferative diabetic retinopathy of right eye, with macular edema, associated with type 2 diabetes mellitus ([place]) 11/05/2019  . Retinal hemorrhage of right eye 11/05/2019  . Retinal hemorrhage of left eye 11/05/2019  . Type 2 diabetes mellitus with hyperglycemia, with long-term current use of insulin ([place]) 05/14/2019  . Diabetes mellitus ([place]) 05/14/2019  . 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 Type 2 diabetes mellitus with diabetic polyneuropathy, with long-term current use of insulin ([place]) 05/14/2019  . Encephalopathy acute 05/20/2018  . 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 Acute kidney failure ([place]) 05/15/2018  . Primary localized osteoarthritis of right knee 05/13/2018  . (HFpEF) heart failure with preserved ejection fraction ([place]) 12/20/2017  . Obesity (BMI 30-39.9) 11/11/2017  . OSA (obstructive sleep apnea) 02/18/2017  . Essential hypertension   . Diabetes mellitus without complication ([place])   . Cancer ([place])   . Thyroid disease     Current Outpatient Medications:  .  acetaminophen (TYLENOL) 500 MG tablet, Take 500-1,000 mg by mouth every 6 (six) hours as needed (for pain or headaches)., Disp: , Rfl:  .  allopurinol (ZYLOPRIM) 100 MG tablet, Take 1 tablet (100 mg total) by mouth daily., Disp: ,  Rfl:  .  apixaban (ELIQUIS) 5 MG TABS tablet, Take 1 tablet (5 mg total) by mouth 2 (two) times daily., Disp: 60 tablet, Rfl: 3 .  atorvastatin (LIPITOR) 80 MG tablet, Take 1 tablet (80 mg total) by mouth at bedtime., Disp: 30 tablet, Rfl: 1 .  Biotin 10 MG CAPS, Take 10 mg by mouth daily. , Disp: , Rfl:  .  Blood Glucose Monitoring Suppl (ACCU-CHEK AVIVA) device, by Other route. Use as instructed, Disp: , Rfl:  .  carvedilol (COREG) 25 MG tablet, Take 1 tablet (25 mg total) by mouth 2 (two) times daily with a meal., Disp: 30 tablet, Rfl: 1 .  colchicine 0.6 MG tablet, TAKE 1 TABLET BY MOUTH EVERY OTHER DAY TO PREVENT FLARES, Disp: , Rfl:  .  empagliflozin (JARDIANCE) 10 MG TABS tablet, Take 1 tablet (10 mg total) by mouth daily., Disp: 30 tablet, Rfl: 1 .  ferrous sulfate 325 (65 FE) MG tablet, Take 1 tablet (325 mg total) by mouth daily with breakfast., Disp: 90 tablet, Rfl: 3 .  gabapentin (NEURONTIN) 300 MG capsule, Take 300 mg by mouth daily. , Disp: , Rfl:  .  glucose blood test strip, Use as instructed to test blood sugars 3 times daily E11.65  Freestyle lite, Disp: 400 each, Rfl: 3 .  insulin glargine (LANTUS) 100 UNIT/ML injection, Inject 88 Units into the skin daily., Disp: , Rfl:  .  insulin lispro (HUMALOG KWIKPEN) 200 UNIT/ML KwikPen, Inject 28 Units into the skin 3 (three) times daily., Disp: 30 mL, Rfl: 6 .  Insulin Pen Needle 32G X 4 MM MISC, 1 Device by Does not  apply route 4 (four) times daily., Disp: 400 each, Rfl: 3 .  isosorbide mononitrate (IMDUR) 30 MG 24 hr tablet, TAKE 1 TABLET BY MOUTH ONCE DAILY, Disp: 30 tablet, Rfl: 1 .  levothyroxine (SYNTHROID, LEVOTHROID) 75 MCG tablet, Take 75 mcg by mouth daily before breakfast., Disp: , Rfl:  .  losartan (COZAAR) 50 MG tablet, TAKE 1 TABLET BY MOUTH AT BEDTIME, Disp: 30 tablet, Rfl: 1 .  Multiple Vitamins-Minerals (ALIVE ONCE DAILY WOMENS 50+ PO), Take 1 tablet by mouth daily., Disp: , Rfl:  .  nitroGLYCERIN (NITROSTAT) 0.4 MG SL  tablet, DISSOLVE ONE TABLET UNDER THE TONGUE EVERY 5 MINUTES AS NEEDED FOR CHEST PAIN. (Patient not taking: Reported on 02/11/2020), Disp: 25 tablet, Rfl: 3 .  Omega-3 Fatty Acids (FISH OIL PO), Take 1 capsule by mouth daily., Disp: , Rfl:  .  potassium chloride (KLOR-CON) 10 MEQ tablet, Take 2 tablets (20 mEq total) by mouth daily., Disp: 60 tablet, Rfl: 1 .  spironolactone (ALDACTONE) 25 MG tablet, Take 1 tablet (25 mg total) by mouth daily., Disp: 30 tablet, Rfl: 3 .  torsemide (DEMADEX) 20 MG tablet, Take 2 tablets (40 mg total) by mouth daily., Disp: 60 tablet, Rfl: 3 .  Travoprost, BAK Free, (TRAVATAN) 0.004 % SOLN ophthalmic solution, Place 1 drop into both eyes at bedtime. , Disp: , Rfl:  Allergies  Allergen Reactions  . Other Other (See Comments)    Blood products- Patient won't accept any  . Sulfa Antibiotics Rash  . Uloric [Febuxostat] Rash     Social History   Socioeconomic History  . Marital status: Widowed    Spouse name: Not on file  . Number of children: 3  . Years of education: Not on file  . Highest education level: Some college, no degree  Occupational History  . Occupation: retired  Tobacco Use  . Smoking status: Former Smoker    Packs/day: 1.00    Years: 22.00    Pack years: 22.00    Types: Cigarettes    Quit date: 1983    Years since quitting: 38.7  . Smokeless tobacco: Never Used  Vaping Use  . Vaping Use: Never used  Substance and Sexual Activity  . Alcohol use: Yes    Comment: 05/22/2018 "glass of wine couple times/wk"  . Drug use: Not Currently  . Sexual activity: Not Currently    Birth control/protection: None, Post-menopausal  Other Topics Concern  . Not on file  Social History Narrative  . Not on file   Social Determinants of Health   Financial Resource Strain:   . Difficulty of Paying Living Expenses: Not on file  Food Insecurity:   . Worried About Charity fundraiser in the Last Year: Not on file  . Ran Out of Food in the Last Year:  Not on file  Transportation Needs:   . Lack of Transportation (Medical): Not on file  . Lack of Transportation (Non-Medical): Not on file  Physical Activity:   . Days of Exercise per Week: Not on file  . Minutes of Exercise per Session: Not on file  Stress:   . Feeling of Stress : Not on file  Social Connections:   . Frequency of Communication with Friends and Family: Not on file  . Frequency of Social Gatherings with Friends and Family: Not on file  . Attends Religious Services: Not on file  . Active Member of Clubs or Organizations: Not on file  . Attends Archivist Meetings: Not on file  .  Marital Status: Not on file  Intimate Partner Violence:   . Fear of Current or Ex-Partner: Not on file  . Emotionally Abused: Not on file  . Physically Abused: Not on file  . Sexually Abused: Not on file    Physical Exam Vitals reviewed.  Constitutional:      Appearance: She is normal weight.  HENT:     Head: Normocephalic.     Nose: Nose normal.     Mouth/Throat:     Mouth: Mucous membranes are moist.  Eyes:     Pupils: Pupils are equal, round, and reactive to light.  Cardiovascular:     Pulses: Normal pulses.     Heart sounds: Normal heart sounds.  Pulmonary:     Effort: Pulmonary effort is normal.     Breath sounds: Normal breath sounds.  Abdominal:     Palpations: Abdomen is soft.  Musculoskeletal:        General: Normal range of motion.     Cervical back: Normal range of motion and neck supple.     Right lower leg: No edema.     Left lower leg: No edema.  Skin:    General: Skin is warm and dry.     Capillary Refill: Capillary refill takes less than 2 seconds.  Neurological:     Mental Status: She is alert. Mental status is at baseline.  Psychiatric:        Mood and Affect: Mood normal.     Arrived for home visit for Welsh who was alert and oriented reported feeling tired and fatigued. Donielle stated she has not had her insulin today, CBG was checked and  was 436. Insulin was given and Esper[place]a was advised to check it again in an hour to ensure it was trending down. She agreed and understood to reach out if no improvement. Other vitals as noted. No edema, shortness of breath or chest pain. Medications were reviewed. Catherene was compliant with medications over the last two weeks. I spoke to [facility] at length today regarding discharge from paramedicine program due to her doing so well and almost completing all goals. Final goal we have is to successfully have her fill pill box on her own and comprehend her medications. We set a goal for her appointment in November to be effiicient with this. Home visit complete.   Refills: allupurinol   CBG- 436  (Insulin 28UNITS  delivered at 1614)    Future Appointments  Date Time Provider [place]  03/01/2020  7:20 AM [facility] DEVICE REMOTES [facility]  04/04/2020 10:30 AM Rankin, Clent Demark, MD [facility] None  04/15/2020 10:10 AM Shamleffer, Melanie Crazier, MD [facility] None  04/25/2020 11:20 AM Bensimhon, Shaune Pascal, MD [facility] None     ACTION: Home visit completed Next visit planned for ONE WEEK

## 2020-02-29 ENCOUNTER — Telehealth: Payer: Self-pay

## 2020-02-29 NOTE — Telephone Encounter (Signed)
Received vmf pt on 9/24 reference PREP.  LVMT on 9/24 for patient explaining out til the 4th of OCT.  Call placed to pt. Pt at eye appt. Will need to call me back to discuss starting PREP.

## 2020-03-01 ENCOUNTER — Ambulatory Visit (INDEPENDENT_AMBULATORY_CARE_PROVIDER_SITE_OTHER): Payer: Medicare HMO

## 2020-03-01 DIAGNOSIS — I495 Sick sinus syndrome: Secondary | ICD-10-CM

## 2020-03-02 LAB — CUP PACEART REMOTE DEVICE CHECK
Battery Remaining Longevity: 62 mo
Battery Voltage: 3 V
Brady Statistic AP VP Percent: 0.02 %
Brady Statistic AP VS Percent: 6.73 %
Brady Statistic AS VP Percent: 0.05 %
Brady Statistic AS VS Percent: 93.2 %
Brady Statistic RA Percent Paced: 6.73 %
Brady Statistic RV Percent Paced: 0.08 %
Date Time Interrogation Session: 20211006121010
Implantable Lead Implant Date: 20160222
Implantable Lead Implant Date: 20160222
Implantable Lead Location: 753859
Implantable Lead Location: 753860
Implantable Lead Model: 5076
Implantable Lead Model: 5076
Implantable Pulse Generator Implant Date: 20160222
Lead Channel Impedance Value: 399 Ohm
Lead Channel Impedance Value: 399 Ohm
Lead Channel Impedance Value: 437 Ohm
Lead Channel Impedance Value: 494 Ohm
Lead Channel Pacing Threshold Amplitude: 0.625 V
Lead Channel Pacing Threshold Amplitude: 0.75 V
Lead Channel Pacing Threshold Pulse Width: 0.4 ms
Lead Channel Pacing Threshold Pulse Width: 0.4 ms
Lead Channel Sensing Intrinsic Amplitude: 15.875 mV
Lead Channel Sensing Intrinsic Amplitude: 15.875 mV
Lead Channel Sensing Intrinsic Amplitude: 5.25 mV
Lead Channel Sensing Intrinsic Amplitude: 5.25 mV
Lead Channel Setting Pacing Amplitude: 2 V
Lead Channel Setting Pacing Amplitude: 2 V
Lead Channel Setting Pacing Pulse Width: 0.4 ms
Lead Channel Setting Sensing Sensitivity: 2 mV

## 2020-03-03 ENCOUNTER — Other Ambulatory Visit (HOSPITAL_COMMUNITY): Payer: Self-pay

## 2020-03-03 ENCOUNTER — Telehealth (HOSPITAL_COMMUNITY): Payer: Self-pay

## 2020-03-03 NOTE — Telephone Encounter (Signed)
Made in error

## 2020-03-03 NOTE — Telephone Encounter (Signed)
Danielle [name], EMT called to report that the patients weight was up 7Ibs from last week and that she had bilateral swelling. Per [name] Clegg,NP patient should take Torsemide 80mg  bid x2 days then continue to take Torsemide 40mg  bid.

## 2020-03-03 NOTE — Progress Notes (Signed)
Paramedicine Encounter    Patient ID: Danielle [name], female    DOB: 1946-04-14, 74 y.o.   MRN: [id number]   Patient Care Team: Seward Carol, MD as PCP - General (Internal Medicine) Constance Haw, MD as PCP - Electrophysiology (Cardiology) Sueanne Margarita, MD as PCP - Sleep Medicine (Cardiology) Jorge Ny, LCSW as Social Worker (Licensed Clinical Social Worker)  Patient Active Problem List   Diagnosis Date Noted  . Severe nonproliferative diabetic retinopathy of left eye, with macular edema, associated with type 2 diabetes mellitus ([place]) 11/05/2019  . Severe nonproliferative diabetic retinopathy of right eye, with macular edema, associated with type 2 diabetes mellitus ([place]) 11/05/2019  . Retinal hemorrhage of right eye 11/05/2019  . Retinal hemorrhage of left eye 11/05/2019  . Type 2 diabetes mellitus with hyperglycemia, with long-term current use of insulin ([place]) 05/14/2019  . Diabetes mellitus ([place]) 05/14/2019  . 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 Type 2 diabetes mellitus with diabetic polyneuropathy, with long-term current use of insulin ([place]) 05/14/2019  . Encephalopathy acute 05/20/2018  . 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 Acute kidney failure ([place]) 05/15/2018  . Primary localized osteoarthritis of right knee 05/13/2018  . (HFpEF) heart failure with preserved ejection fraction ([place]) 12/20/2017  . Obesity (BMI 30-39.9) 11/11/2017  . OSA (obstructive sleep apnea) 02/18/2017  . Essential hypertension   . Diabetes mellitus without complication ([place])   . Cancer ([place])   . Thyroid disease     Current Outpatient Medications:  .  acetaminophen (TYLENOL) 500 MG tablet, Take 500-1,000 mg by mouth every 6 (six) hours as needed (for pain or headaches)., Disp: , Rfl:  .  allopurinol (ZYLOPRIM) 100 MG tablet, Take 1 tablet (100 mg total) by mouth daily., Disp: ,  Rfl:  .  apixaban (ELIQUIS) 5 MG TABS tablet, Take 1 tablet (5 mg total) by mouth 2 (two) times daily., Disp: 60 tablet, Rfl: 3 .  atorvastatin (LIPITOR) 80 MG tablet, Take 1 tablet (80 mg total) by mouth at bedtime., Disp: 30 tablet, Rfl: 1 .  Biotin 10 MG CAPS, Take 10 mg by mouth daily. , Disp: , Rfl:  .  Blood Glucose Monitoring Suppl (ACCU-CHEK AVIVA) device, by Other route. Use as instructed, Disp: , Rfl:  .  carvedilol (COREG) 25 MG tablet, Take 1 tablet (25 mg total) by mouth 2 (two) times daily with a meal., Disp: 30 tablet, Rfl: 1 .  colchicine 0.6 MG tablet, TAKE 1 TABLET BY MOUTH EVERY OTHER DAY TO PREVENT FLARES, Disp: , Rfl:  .  empagliflozin (JARDIANCE) 10 MG TABS tablet, Take 1 tablet (10 mg total) by mouth daily., Disp: 30 tablet, Rfl: 1 .  ferrous sulfate 325 (65 FE) MG tablet, Take 1 tablet (325 mg total) by mouth daily with breakfast., Disp: 90 tablet, Rfl: 3 .  gabapentin (NEURONTIN) 300 MG capsule, Take 300 mg by mouth daily. , Disp: , Rfl:  .  glucose blood test strip, Use as instructed to test blood sugars 3 times daily E11.65  Freestyle lite, Disp: 400 each, Rfl: 3 .  insulin glargine (LANTUS) 100 UNIT/ML injection, Inject 88 Units into the skin daily., Disp: , Rfl:  .  insulin lispro (HUMALOG KWIKPEN) 200 UNIT/ML KwikPen, Inject 28 Units into the skin 3 (three) times daily., Disp: 30 mL, Rfl: 6 .  Insulin Pen Needle 32G X 4 MM MISC, 1 Device by Does not  apply route 4 (four) times daily., Disp: 400 each, Rfl: 3 .  isosorbide mononitrate (IMDUR) 30 MG 24 hr tablet, TAKE 1 TABLET BY MOUTH ONCE DAILY, Disp: 30 tablet, Rfl: 1 .  levothyroxine (SYNTHROID, LEVOTHROID) 75 MCG tablet, Take 75 mcg by mouth daily before breakfast., Disp: , Rfl:  .  losartan (COZAAR) 50 MG tablet, TAKE 1 TABLET BY MOUTH AT BEDTIME, Disp: 30 tablet, Rfl: 1 .  Multiple Vitamins-Minerals (ALIVE ONCE DAILY WOMENS 50+ PO), Take 1 tablet by mouth daily., Disp: , Rfl:  .  nitroGLYCERIN (NITROSTAT) 0.4 MG SL  tablet, DISSOLVE ONE TABLET UNDER THE TONGUE EVERY 5 MINUTES AS NEEDED FOR CHEST PAIN. (Patient not taking: Reported on 02/11/2020), Disp: 25 tablet, Rfl: 3 .  Omega-3 Fatty Acids (FISH OIL PO), Take 1 capsule by mouth daily., Disp: , Rfl:  .  potassium chloride (KLOR-CON) 10 MEQ tablet, Take 2 tablets (20 mEq total) by mouth daily., Disp: 60 tablet, Rfl: 1 .  spironolactone (ALDACTONE) 25 MG tablet, Take 1 tablet (25 mg total) by mouth daily., Disp: 30 tablet, Rfl: 3 .  torsemide (DEMADEX) 20 MG tablet, Take 2 tablets (40 mg total) by mouth daily., Disp: 60 tablet, Rfl: 3 .  Travoprost, BAK Free, (TRAVATAN) 0.004 % SOLN ophthalmic solution, Place 1 drop into both eyes at bedtime. , Disp: , Rfl:  Allergies  Allergen Reactions  . Other Other (See Comments)    Blood products- Patient won't accept any  . Sulfa Antibiotics Rash  . Uloric [Febuxostat] Rash     Social History   Socioeconomic History  . Marital status: Widowed    Spouse name: Not on file  . Number of children: 3  . Years of education: Not on file  . Highest education level: Some college, no degree  Occupational History  . Occupation: retired  Tobacco Use  . Smoking status: Former Smoker    Packs/day: 1.00    Years: 22.00    Pack years: 22.00    Types: Cigarettes    Quit date: 1983    Years since quitting: 38.7  . Smokeless tobacco: Never Used  Vaping Use  . Vaping Use: Never used  Substance and Sexual Activity  . Alcohol use: Yes    Comment: 05/22/2018 "glass of wine couple times/wk"  . Drug use: Not Currently  . Sexual activity: Not Currently    Birth control/protection: None, Post-menopausal  Other Topics Concern  . Not on file  Social History Narrative  . Not on file   Social Determinants of Health   Financial Resource Strain:   . Difficulty of Paying Living Expenses: Not on file  Food Insecurity:   . Worried About Charity fundraiser in the Last Year: Not on file  . Ran Out of Food in the Last Year:  Not on file  Transportation Needs:   . Lack of Transportation (Medical): Not on file  . Lack of Transportation (Non-Medical): Not on file  Physical Activity:   . Days of Exercise per Week: Not on file  . Minutes of Exercise per Session: Not on file  Stress:   . Feeling of Stress : Not on file  Social Connections:   . Frequency of Communication with Friends and Family: Not on file  . Frequency of Social Gatherings with Friends and Family: Not on file  . Attends Religious Services: Not on file  . Active Member of Clubs or Organizations: Not on file  . Attends Archivist Meetings: Not on file  .  Marital Status: Not on file  Intimate Partner Violence:   . Fear of Current or Ex-Partner: Not on file  . Emotionally Abused: Not on file  . Physically Abused: Not on file  . Sexually Abused: Not on file    Physical Exam Vitals reviewed.  Constitutional:      Comments: Increased weight.  HENT:     Head: Normocephalic.     Nose: Nose normal.     Mouth/Throat:     Mouth: Mucous membranes are moist.     Pharynx: Oropharynx is clear.  Eyes:     Pupils: Pupils are equal, round, and reactive to light.  Cardiovascular:     Rate and Rhythm: Normal rate and regular rhythm.  Pulmonary:     Effort: Pulmonary effort is normal.     Breath sounds: Normal breath sounds.  Abdominal:     Palpations: Abdomen is soft.  Musculoskeletal:        General: Swelling present. Normal range of motion.     Cervical back: Normal range of motion.     Right lower leg: Edema present.     Left lower leg: Edema present.     Comments: Swelling in both legs and hands.   Skin:    General: Skin is warm and dry.     Capillary Refill: Capillary refill takes less than 2 seconds.  Neurological:     Mental Status: She is alert. Mental status is at baseline.  Psychiatric:        Mood and Affect: Mood normal.    Arrived for home visit for [facility] who was alert and oriented reporting she was having some  increased swelling over the last few days. Weight increased by 7lbs this week. Vitals obtained. Edema noted in both legs and hands. Danielle [name] reports she has not had extra salt but stated this past week has been drinking more tea than normal, but was compliant with medications. I educated Danielle [name] about same. I contacted HF clinic, [name] PA ordered increase of Torsemide to 40mg  BID for three days. I confirmed pill box and filled accordingly. Danielle [name] denied increased shortness of breath. Danielle [name] agreed to weigh daily and be compliant with medications. Home visit completed. I will see in one week.   Refills: Allupurinol    Future Appointments  Date Time Provider [place]  04/04/2020 10:30 AM Rankin, Clent Demark, MD [facility] None  04/15/2020 10:10 AM Shamleffer, Melanie Crazier, MD [facility] None  04/25/2020 11:20 AM Bensimhon, Shaune Pascal, MD [facility] None     ACTION: Home visit completed Next visit planned for one week

## 2020-03-04 NOTE — Progress Notes (Signed)
Remote pacemaker transmission.   

## 2020-03-07 ENCOUNTER — Telehealth: Payer: Self-pay

## 2020-03-07 NOTE — Telephone Encounter (Signed)
[facility] pt requesting call back reference PREP class starting on 10/18--M/W 230p-345p. Would she like to participate?

## 2020-03-08 ENCOUNTER — Telehealth: Payer: Self-pay

## 2020-03-08 NOTE — Telephone Encounter (Signed)
Pt returned call reference PREP program Is interested and can start with next class on 10/18. M/W 230p-345p.  Intake scheduled for 10/14 at 230pm, she will schedule transport thru [facility] and will let me know if I can help scheduling

## 2020-03-10 NOTE — Progress Notes (Signed)
[facility] Report   Patient Details  Name: Danielle [name] MRN: [id number] Date of Birth: Oct 31, 1945 Age: 74 y.o. PCP: Seward Carol, MD  Vitals:   03/10/20 1522  BP: 92/62  Pulse: 74  SpO2: 97%  Weight: 226 lb 3.2 oz (102.6 kg)      [facility] Eval - 03/10/20 1500      Referral    Referring Provider [place]    Reason for referral Diabetes;Heart Failure;Inactivity;Orthopedic    Program Start Date 03/14/20   MW 230p-345p     Measurement   Waist Circumference 49 inches    Hip Circumference 46 inches    Body fat 44.2 percent      Information for Trainer   Goals Move better, relief of back pain    Current Exercise [place], sees chiro    Orthopedic Concerns Back, neck, knees    Pertinent Medical History DM, HTN, CHF, Breast Cancer, Bil TKR, OSA    Current Barriers Pain    Restrictions/Precautions Diabetic snack before exercise;Fall risk;Assistive device    Medications that affect exercise Beta blocker;Medication causing dizziness/drowsiness      Timed Up and Go (TUGS)   Timed Up and Go Moderate risk 10-12 seconds      Mobility and Daily Activities   I find it easy to walk up or down two or more flights of stairs. 2    I have no trouble taking out the trash. 2    I do housework such as vacuuming and dusting on my own without difficulty. 2    I can easily lift a gallon of milk (8lbs). 2    I can easily walk a mile. 1    I have no trouble reaching into high cupboards or reaching down to pick up something from the floor. 1    I do not have trouble doing out-door work such as Armed forces logistics/support/administrative officer, raking leaves, or gardening. 1      Mobility and Daily Activities   I feel younger than my age. 2    I feel independent. 4    I feel energetic. 2    I live an active life.  3    I feel strong. 2    I feel healthy. 1    I feel active as other people my age. 1      How fit and strong are you.   Fit and Strong Total Score 26          Past Medical History:    Diagnosis Date  . Acute combined systolic and diastolic congestive heart failure ([place]) 02/05/2017  . 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 Acute kidney failure ([place]) 05/15/2018  . Arthritis    "all over" (05/22/2018)  . Blood dyscrasia    per pt-has small blood cells-appears as if anemic  . Breast cancer, left breast ([place]) 1985  . CHF (congestive heart failure) ([place])   . Complication of anesthesia    difficult to awaken from per pt  . Coronary artery disease   . Dyspnea   . Essential hypertension   . Heart disease   . Heart murmur   . History of gout   . Hypothyroidism   . Obesity (BMI 30-39.9) 11/11/2017  . OSA (obstructive sleep apnea) 02/18/2017    severe obstructive sleep apnea with an AHI of 75.6/h and mild central sleep apnea with a CAI of 7.7/h.  Oxygen saturations dropped as low as 82%.  He is on CPAP at 9 cm H2O.  . OSA on CPAP   . Personal history of chemotherapy   . Personal history of radiation therapy   . Presence of permanent cardiac pacemaker   . Primary localized osteoarthritis of right knee 05/13/2018  . Refusal of blood transfusions as patient is Jehovah's Witness   . Type II diabetes mellitus ([place])    Past Surgical History:  Procedure Laterality Date  . BREAST BIOPSY Left 1985  . BREAST LUMPECTOMY Left 1985  . BREAST LUMPECTOMY WITH NEEDLE LOCALIZATION AND AXILLARY LYMPH NODE DISSECTION  1985  . CATARACT EXTRACTION W/ INTRAOCULAR LENS  IMPLANT, BILATERAL Bilateral   . CORONARY ANGIOPLASTY WITH STENT PLACEMENT  2010, 2012,2017    2 done 2010 and 1 replaced 2012 and 2 replaced in 2017  . DILATION AND CURETTAGE OF UTERUS    . INSERT / REPLACE / REMOVE PACEMAKER  07/19/2014  . JOINT REPLACEMENT    . REPLACEMENT TOTAL KNEE Left 09/2012  . THYROIDECTOMY, PARTIAL  1978  . TONSILLECTOMY    . TOTAL KNEE ARTHROPLASTY Right 05/13/2018   Procedure: TOTAL KNEE ARTHROPLASTY;  Surgeon: Marchia Bond, MD;  Location: [facility];  Service:  Orthopedics;  Laterality: Right;  . TUBAL LIGATION     Social History   Tobacco Use  Smoking Status Former Smoker  . Packs/day: 1.00  . Years: 22.00  . Pack years: 22.00  . Types: Cigarettes  . Quit date: 74  . Years since quitting: 38.8  Smokeless Tobacco Never Used       Barnett Hatter 03/10/2020, 3:26 PM

## 2020-03-15 ENCOUNTER — Other Ambulatory Visit (HOSPITAL_COMMUNITY): Payer: Self-pay

## 2020-03-15 NOTE — Progress Notes (Signed)
Paramedicine Encounter    Patient ID: Danielle [name], female    DOB: 12-07-1945, 74 y.o.   MRN: [id number]   Patient Care Team: Seward Carol, MD as PCP - General (Internal Medicine) Constance Haw, MD as PCP - Electrophysiology (Cardiology) Sueanne Margarita, MD as PCP - Sleep Medicine (Cardiology) Jorge Ny, LCSW as Social Worker (Licensed Clinical Social Worker)  Patient Active Problem List   Diagnosis Date Noted  . Severe nonproliferative diabetic retinopathy of left eye, with macular edema, associated with type 2 diabetes mellitus ([place]) 11/05/2019  . Severe nonproliferative diabetic retinopathy of right eye, with macular edema, associated with type 2 diabetes mellitus ([place]) 11/05/2019  . Retinal hemorrhage of right eye 11/05/2019  . Retinal hemorrhage of left eye 11/05/2019  . Type 2 diabetes mellitus with hyperglycemia, with long-term current use of insulin ([place]) 05/14/2019  . Diabetes mellitus ([place]) 05/14/2019  . 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, with long-term current use of insulin ([place]) 05/14/2019  . Type 2 diabetes mellitus with diabetic polyneuropathy, with long-term current use of insulin ([place]) 05/14/2019  . Encephalopathy acute 05/20/2018  . 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 Acute kidney failure ([place]) 05/15/2018  . Primary localized osteoarthritis of right knee 05/13/2018  . (HFpEF) heart failure with preserved ejection fraction ([place]) 12/20/2017  . Obesity (BMI 30-39.9) 11/11/2017  . OSA (obstructive sleep apnea) 02/18/2017  . Essential hypertension   . Diabetes mellitus without complication ([place])   . Cancer ([place])   . Thyroid disease     Current Outpatient Medications:  .  acetaminophen (TYLENOL) 500 MG tablet, Take 500-1,000 mg by mouth every 6 (six) hours as needed (for pain or headaches)., Disp: , Rfl:  .  allopurinol (ZYLOPRIM) 100 MG tablet, Take 1 tablet (100 mg total) by mouth daily., Disp: ,  Rfl:  .  apixaban (ELIQUIS) 5 MG TABS tablet, Take 1 tablet (5 mg total) by mouth 2 (two) times daily., Disp: 60 tablet, Rfl: 3 .  atorvastatin (LIPITOR) 80 MG tablet, Take 1 tablet (80 mg total) by mouth at bedtime., Disp: 30 tablet, Rfl: 1 .  Biotin 10 MG CAPS, Take 10 mg by mouth daily. , Disp: , Rfl:  .  Blood Glucose Monitoring Suppl (ACCU-CHEK AVIVA) device, by Other route. Use as instructed, Disp: , Rfl:  .  carvedilol (COREG) 25 MG tablet, Take 1 tablet (25 mg total) by mouth 2 (two) times daily with a meal., Disp: 30 tablet, Rfl: 1 .  colchicine 0.6 MG tablet, TAKE 1 TABLET BY MOUTH EVERY OTHER DAY TO PREVENT FLARES, Disp: , Rfl:  .  empagliflozin (JARDIANCE) 10 MG TABS tablet, Take 1 tablet (10 mg total) by mouth daily., Disp: 30 tablet, Rfl: 1 .  ferrous sulfate 325 (65 FE) MG tablet, Take 1 tablet (325 mg total) by mouth daily with breakfast., Disp: 90 tablet, Rfl: 3 .  gabapentin (NEURONTIN) 300 MG capsule, Take 300 mg by mouth daily. , Disp: , Rfl:  .  glucose blood test strip, Use as instructed to test blood sugars 3 times daily E11.65  Freestyle lite, Disp: 400 each, Rfl: 3 .  insulin glargine (LANTUS) 100 UNIT/ML injection, Inject 88 Units into the skin daily., Disp: , Rfl:  .  insulin lispro (HUMALOG KWIKPEN) 200 UNIT/ML KwikPen, Inject 28 Units into the skin 3 (three) times daily., Disp: 30 mL, Rfl: 6 .  Insulin Pen Needle 32G X 4 MM MISC, 1 Device by Does not  apply route 4 (four) times daily., Disp: 400 each, Rfl: 3 .  isosorbide mononitrate (IMDUR) 30 MG 24 hr tablet, TAKE 1 TABLET BY MOUTH ONCE DAILY, Disp: 30 tablet, Rfl: 1 .  levothyroxine (SYNTHROID, LEVOTHROID) 75 MCG tablet, Take 75 mcg by mouth daily before breakfast., Disp: , Rfl:  .  losartan (COZAAR) 50 MG tablet, TAKE 1 TABLET BY MOUTH AT BEDTIME, Disp: 30 tablet, Rfl: 1 .  Multiple Vitamins-Minerals (ALIVE ONCE DAILY WOMENS 50+ PO), Take 1 tablet by mouth daily., Disp: , Rfl:  .  nitroGLYCERIN (NITROSTAT) 0.4 MG SL  tablet, DISSOLVE ONE TABLET UNDER THE TONGUE EVERY 5 MINUTES AS NEEDED FOR CHEST PAIN. (Patient not taking: Reported on 02/11/2020), Disp: 25 tablet, Rfl: 3 .  Omega-3 Fatty Acids (FISH OIL PO), Take 1 capsule by mouth daily., Disp: , Rfl:  .  potassium chloride (KLOR-CON) 10 MEQ tablet, Take 2 tablets (20 mEq total) by mouth daily., Disp: 60 tablet, Rfl: 1 .  spironolactone (ALDACTONE) 25 MG tablet, Take 1 tablet (25 mg total) by mouth daily., Disp: 30 tablet, Rfl: 3 .  torsemide (DEMADEX) 20 MG tablet, Take 2 tablets (40 mg total) by mouth daily., Disp: 60 tablet, Rfl: 3 .  Travoprost, BAK Free, (TRAVATAN) 0.004 % SOLN ophthalmic solution, Place 1 drop into both eyes at bedtime. , Disp: , Rfl:  Allergies  Allergen Reactions  . Other Other (See Comments)    Blood products- Patient won't accept any  . Sulfa Antibiotics Rash  . Uloric [Febuxostat] Rash     Social History   Socioeconomic History  . Marital status: Widowed    Spouse name: Not on file  . Number of children: 3  . Years of education: Not on file  . Highest education level: Some college, no degree  Occupational History  . Occupation: retired  Tobacco Use  . Smoking status: Former Smoker    Packs/day: 1.00    Years: 22.00    Pack years: 22.00    Types: Cigarettes    Quit date: 1983    Years since quitting: 38.8  . Smokeless tobacco: Never Used  Vaping Use  . Vaping Use: Never used  Substance and Sexual Activity  . Alcohol use: Yes    Comment: 05/22/2018 "glass of wine couple times/wk"  . Drug use: Not Currently  . Sexual activity: Not Currently    Birth control/protection: None, Post-menopausal  Other Topics Concern  . Not on file  Social History Narrative  . Not on file   Social Determinants of Health   Financial Resource Strain:   . Difficulty of Paying Living Expenses: Not on file  Food Insecurity:   . Worried About Charity fundraiser in the Last Year: Not on file  . Ran Out of Food in the Last Year:  Not on file  Transportation Needs:   . Lack of Transportation (Medical): Not on file  . Lack of Transportation (Non-Medical): Not on file  Physical Activity:   . Days of Exercise per Week: Not on file  . Minutes of Exercise per Session: Not on file  Stress:   . Feeling of Stress : Not on file  Social Connections:   . Frequency of Communication with Friends and Family: Not on file  . Frequency of Social Gatherings with Friends and Family: Not on file  . Attends Religious Services: Not on file  . Active Member of Clubs or Organizations: Not on file  . Attends Archivist Meetings: Not on file  .  Marital Status: Not on file  Intimate Partner Violence:   . Fear of Current or Ex-Partner: Not on file  . Emotionally Abused: Not on file  . Physically Abused: Not on file  . Sexually Abused: Not on file    Physical Exam Vitals reviewed.  Constitutional:      Appearance: She is normal weight.  HENT:     Head: Normocephalic.     Nose: Nose normal. No congestion.     Mouth/Throat:     Mouth: Mucous membranes are moist.     Pharynx: Oropharynx is clear.  Eyes:     Pupils: Pupils are equal, round, and reactive to light.  Cardiovascular:     Rate and Rhythm: Normal rate and regular rhythm.     Pulses: Normal pulses.     Heart sounds: Normal heart sounds.  Pulmonary:     Effort: Pulmonary effort is normal.     Breath sounds: Normal breath sounds.  Abdominal:     General: Abdomen is flat.     Palpations: Abdomen is soft.  Musculoskeletal:        General: Normal range of motion.     Cervical back: Normal range of motion.     Right lower leg: Edema present.     Left lower leg: Edema present.  Skin:    General: Skin is warm and dry.     Capillary Refill: Capillary refill takes less than 2 seconds.  Neurological:     General: No focal deficit present.     Mental Status: She is alert. Mental status is at baseline.  Psychiatric:        Mood and Affect: Mood normal.       Arrived for home visit for [place] who was alert and oriented seated at her kitchen table at home reporting she was feeling really good. Valary went to her first PREP appointment yesterday and reports great success and that she enjoyed same. Vitals were obtained. Edema noted to both lower legs. Makynlie reports a lot of exercise lately and not wearing her compression stockings. Gennett has been compliant with her medications. I reviewed same and filled pill box accordingly. Aaralyn agreed to visit next week on Monday. Home visit complete. I will see Nohelani next week.   Refills:  Eliquis Spironolactone Torsemide      Future Appointments  Date Time Provider [place]  04/04/2020 10:30 AM Rankin, Clent Demark, MD [facility] None  04/15/2020 10:10 AM Shamleffer, Melanie Crazier, MD [facility] None  04/25/2020 11:20 AM Bensimhon, Shaune Pascal, MD [facility] None  06/03/2020  7:05 AM [facility] DEVICE REMOTES [facility]  09/02/2020  7:05 AM [facility] DEVICE REMOTES [facility]  12/02/2020  7:05 AM [facility] DEVICE REMOTES [facility]  03/03/2021  7:05 AM [facility] DEVICE REMOTES [facility]  06/02/2021  7:05 AM [facility] DEVICE REMOTES [facility]  09/01/2021  7:05 AM [facility] DEVICE REMOTES [facility]     ACTION: Home visit completed Next visit planned for next monday

## 2020-03-24 ENCOUNTER — Other Ambulatory Visit (HOSPITAL_COMMUNITY): Payer: Self-pay

## 2020-03-24 NOTE — Progress Notes (Signed)
Paramedicine Encounter    Patient ID: Danielle [name], female    DOB: 1945-10-11, 74 y.o.   MRN: [id number]   Patient Care Team: Seward Carol, MD as PCP - General (Internal Medicine) Constance Haw, MD as PCP - Electrophysiology (Cardiology) Sueanne Margarita, MD as PCP - Sleep Medicine (Cardiology) Jorge Ny, LCSW as Social Worker (Licensed Clinical Social Worker)  Patient Active Problem List   Diagnosis Date Noted  . Severe nonproliferative diabetic retinopathy of left eye, with macular edema, associated with type 2 diabetes mellitus ([place]) 11/05/2019  . Severe nonproliferative diabetic retinopathy of right eye, with macular edema, associated with type 2 diabetes mellitus ([place]) 11/05/2019  . Retinal hemorrhage of right eye 11/05/2019  . Retinal hemorrhage of left eye 11/05/2019  . Type 2 diabetes mellitus with hyperglycemia, with long-term current use of insulin ([place]) 05/14/2019  . Diabetes mellitus ([place]) 05/14/2019  . 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, with long-term current use of insulin ([place]) 05/14/2019  . Type 2 diabetes mellitus with diabetic polyneuropathy, with long-term current use of insulin ([place]) 05/14/2019  . Encephalopathy acute 05/20/2018  . 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 Acute kidney failure ([place]) 05/15/2018  . Primary localized osteoarthritis of right knee 05/13/2018  . (HFpEF) heart failure with preserved ejection fraction ([place]) 12/20/2017  . Obesity (BMI 30-39.9) 11/11/2017  . OSA (obstructive sleep apnea) 02/18/2017  . Essential hypertension   . Diabetes mellitus without complication ([place])   . Cancer ([place])   . Thyroid disease     Current Outpatient Medications:  .  acetaminophen (TYLENOL) 500 MG tablet, Take 500-1,000 mg by mouth every 6 (six) hours as needed (for pain or headaches)., Disp: , Rfl:  .  allopurinol (ZYLOPRIM) 100 MG tablet, Take 1 tablet (100 mg total) by mouth daily., Disp: ,  Rfl:  .  apixaban (ELIQUIS) 5 MG TABS tablet, Take 1 tablet (5 mg total) by mouth 2 (two) times daily., Disp: 60 tablet, Rfl: 3 .  atorvastatin (LIPITOR) 80 MG tablet, Take 1 tablet (80 mg total) by mouth at bedtime., Disp: 30 tablet, Rfl: 1 .  Biotin 10 MG CAPS, Take 10 mg by mouth daily. , Disp: , Rfl:  .  Blood Glucose Monitoring Suppl (ACCU-CHEK AVIVA) device, by Other route. Use as instructed, Disp: , Rfl:  .  carvedilol (COREG) 25 MG tablet, Take 1 tablet (25 mg total) by mouth 2 (two) times daily with a meal., Disp: 30 tablet, Rfl: 1 .  colchicine 0.6 MG tablet, TAKE 1 TABLET BY MOUTH EVERY OTHER DAY TO PREVENT FLARES, Disp: , Rfl:  .  empagliflozin (JARDIANCE) 10 MG TABS tablet, Take 1 tablet (10 mg total) by mouth daily., Disp: 30 tablet, Rfl: 1 .  ferrous sulfate 325 (65 FE) MG tablet, Take 1 tablet (325 mg total) by mouth daily with breakfast., Disp: 90 tablet, Rfl: 3 .  gabapentin (NEURONTIN) 300 MG capsule, Take 300 mg by mouth daily. , Disp: , Rfl:  .  glucose blood test strip, Use as instructed to test blood sugars 3 times daily E11.65  Freestyle lite, Disp: 400 each, Rfl: 3 .  insulin glargine (LANTUS) 100 UNIT/ML injection, Inject 88 Units into the skin daily., Disp: , Rfl:  .  insulin lispro (HUMALOG KWIKPEN) 200 UNIT/ML KwikPen, Inject 28 Units into the skin 3 (three) times daily., Disp: 30 mL, Rfl: 6 .  Insulin Pen Needle 32G X 4 MM MISC, 1 Device by Does not  apply route 4 (four) times daily., Disp: 400 each, Rfl: 3 .  isosorbide mononitrate (IMDUR) 30 MG 24 hr tablet, TAKE 1 TABLET BY MOUTH ONCE DAILY, Disp: 30 tablet, Rfl: 1 .  levothyroxine (SYNTHROID, LEVOTHROID) 75 MCG tablet, Take 75 mcg by mouth daily before breakfast., Disp: , Rfl:  .  losartan (COZAAR) 50 MG tablet, TAKE 1 TABLET BY MOUTH AT BEDTIME, Disp: 30 tablet, Rfl: 1 .  Multiple Vitamins-Minerals (ALIVE ONCE DAILY WOMENS 50+ PO), Take 1 tablet by mouth daily., Disp: , Rfl:  .  nitroGLYCERIN (NITROSTAT) 0.4 MG SL  tablet, DISSOLVE ONE TABLET UNDER THE TONGUE EVERY 5 MINUTES AS NEEDED FOR CHEST PAIN. (Patient not taking: Reported on 02/11/2020), Disp: 25 tablet, Rfl: 3 .  Omega-3 Fatty Acids (FISH OIL PO), Take 1 capsule by mouth daily., Disp: , Rfl:  .  potassium chloride (KLOR-CON) 10 MEQ tablet, Take 2 tablets (20 mEq total) by mouth daily., Disp: 60 tablet, Rfl: 1 .  spironolactone (ALDACTONE) 25 MG tablet, Take 1 tablet (25 mg total) by mouth daily., Disp: 30 tablet, Rfl: 3 .  torsemide (DEMADEX) 20 MG tablet, Take 2 tablets (40 mg total) by mouth daily., Disp: 60 tablet, Rfl: 3 .  Travoprost, BAK Free, (TRAVATAN) 0.004 % SOLN ophthalmic solution, Place 1 drop into both eyes at bedtime. , Disp: , Rfl:  Allergies  Allergen Reactions  . Other Other (See Comments)    Blood products- Patient won't accept any  . Sulfa Antibiotics Rash  . Uloric [Febuxostat] Rash     Social History   Socioeconomic History  . Marital status: Widowed    Spouse name: Not on file  . Number of children: 3  . Years of education: Not on file  . Highest education level: Some college, no degree  Occupational History  . Occupation: retired  Tobacco Use  . Smoking status: Former Smoker    Packs/day: 1.00    Years: 22.00    Pack years: 22.00    Types: Cigarettes    Quit date: 1983    Years since quitting: 38.8  . Smokeless tobacco: Never Used  Vaping Use  . Vaping Use: Never used  Substance and Sexual Activity  . Alcohol use: Yes    Comment: 05/22/2018 "glass of wine couple times/wk"  . Drug use: Not Currently  . Sexual activity: Not Currently    Birth control/protection: None, Post-menopausal  Other Topics Concern  . Not on file  Social History Narrative  . Not on file   Social Determinants of Health   Financial Resource Strain:   . Difficulty of Paying Living Expenses: Not on file  Food Insecurity:   . Worried About Charity fundraiser in the Last Year: Not on file  . Ran Out of Food in the Last Year:  Not on file  Transportation Needs:   . Lack of Transportation (Medical): Not on file  . Lack of Transportation (Non-Medical): Not on file  Physical Activity:   . Days of Exercise per Week: Not on file  . Minutes of Exercise per Session: Not on file  Stress:   . Feeling of Stress : Not on file  Social Connections:   . Frequency of Communication with Friends and Family: Not on file  . Frequency of Social Gatherings with Friends and Family: Not on file  . Attends Religious Services: Not on file  . Active Member of Clubs or Organizations: Not on file  . Attends Archivist Meetings: Not on file  .  Marital Status: Not on file  Intimate Partner Violence:   . Fear of Current or Ex-Partner: Not on file  . Emotionally Abused: Not on file  . Physically Abused: Not on file  . Sexually Abused: Not on file    Physical Exam Vitals reviewed.  Constitutional:      Appearance: She is normal weight.  HENT:     Head: Normocephalic.     Nose: Nose normal.     Mouth/Throat:     Mouth: Mucous membranes are moist.     Pharynx: Oropharynx is clear.  Eyes:     Pupils: Pupils are equal, round, and reactive to light.  Cardiovascular:     Rate and Rhythm: Normal rate and regular rhythm.     Pulses: Normal pulses.     Heart sounds: Normal heart sounds.  Pulmonary:     Effort: Pulmonary effort is normal.     Breath sounds: Normal breath sounds.  Abdominal:     General: Abdomen is flat.     Palpations: Abdomen is soft.  Musculoskeletal:        General: Swelling and tenderness present. Normal range of motion.     Cervical back: Normal range of motion.     Right lower leg: Edema present.     Left lower leg: Edema present.     Comments: GOUT FLARE UP  Skin:    General: Skin is warm and dry.     Capillary Refill: Capillary refill takes less than 2 seconds.  Neurological:     General: No focal deficit present.     Mental Status: She is alert. Mental status is at baseline.  Psychiatric:         Mood and Affect: Mood normal.       Arrived for home visit for [place] who was alert and oriented reporting she was having a gout flare up and was seen by her PCP today in office who gave her an increased dosing of Colchicine to assist with the swelling and pain. Medications were verified and pill box reflected increases and current medications. Vitals as noted in report. Swelling noted to hands and feet, no pitting edema noted. Lung sounds clear. [place] reports PREP program is going well and she is enjoying it. Home visit complete. I will see Scout in one week.   Refills: NONE   Future Appointments  Date Time Provider [place]  04/04/2020 10:30 AM Rankin, Clent Demark, MD [facility] None  04/15/2020 10:10 AM Shamleffer, Melanie Crazier, MD [facility] None  04/25/2020 11:20 AM Bensimhon, Shaune Pascal, MD [facility] None  06/03/2020  7:05 AM [facility] DEVICE REMOTES [facility]  09/02/2020  7:05 AM [facility] DEVICE REMOTES [facility]  12/02/2020  7:05 AM [facility] DEVICE REMOTES [facility]  03/03/2021  7:05 AM [facility] DEVICE REMOTES [facility]  06/02/2021  7:05 AM [facility] DEVICE REMOTES [facility]  09/01/2021  7:05 AM [facility] DEVICE REMOTES [facility]     ACTION: Home visit completed Next visit planned for one week

## 2020-03-28 NOTE — Progress Notes (Signed)
[facility] PREP Weekly Session   Patient Details  Name: Rajanee Schuelke MRN: [id number] Date of Birth: 27-Jan-1946 Age: 74 y.o. PCP: Seward Carol, MD  Vitals:   03/28/20 1621  Weight: 218 lb (98.9 kg)     [facility] Weekly seesion - 03/28/20 1600      Weekly Session   Topic Discussed Healthy eating tips    Minutes exercised this week --   none recorded    Classes attended to date 5          Requested help with transportation. Having difficulty getting scat arranged.    Barnett Hatter 03/28/2020, 4:22 PM

## 2020-03-31 ENCOUNTER — Other Ambulatory Visit (HOSPITAL_COMMUNITY): Payer: Self-pay

## 2020-03-31 NOTE — Progress Notes (Signed)
Paramedicine Encounter    Patient ID: Danielle [name], female    DOB: 1946-04-14, 74 y.o.   MRN: [id number]   Patient Care Team: Seward Carol, MD as PCP - General (Internal Medicine) Constance Haw, MD as PCP - Electrophysiology (Cardiology) Sueanne Margarita, MD as PCP - Sleep Medicine (Cardiology) Jorge Ny, LCSW as Social Worker (Licensed Clinical Social Worker)  Patient Active Problem List   Diagnosis Date Noted  . Severe nonproliferative diabetic retinopathy of left eye, with macular edema, associated with type 2 diabetes mellitus ([place]) 11/05/2019  . Severe nonproliferative diabetic retinopathy of right eye, with macular edema, associated with type 2 diabetes mellitus ([place]) 11/05/2019  . Retinal hemorrhage of right eye 11/05/2019  . Retinal hemorrhage of left eye 11/05/2019  . Type 2 diabetes mellitus with hyperglycemia, with long-term current use of insulin ([place]) 05/14/2019  . Diabetes mellitus ([place]) 05/14/2019  . 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 Type 2 diabetes mellitus with diabetic polyneuropathy, with long-term current use of insulin ([place]) 05/14/2019  . Encephalopathy acute 05/20/2018  . 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 Acute kidney failure ([place]) 05/15/2018  . Primary localized osteoarthritis of right knee 05/13/2018  . (HFpEF) heart failure with preserved ejection fraction ([place]) 12/20/2017  . Obesity (BMI 30-39.9) 11/11/2017  . OSA (obstructive sleep apnea) 02/18/2017  . Essential hypertension   . Diabetes mellitus without complication ([place])   . Cancer ([place])   . Thyroid disease     Current Outpatient Medications:  .  acetaminophen (TYLENOL) 500 MG tablet, Take 500-1,000 mg by mouth every 6 (six) hours as needed (for pain or headaches)., Disp: , Rfl:  .  allopurinol (ZYLOPRIM) 100 MG tablet, Take 1 tablet (100 mg total) by mouth daily., Disp: ,  Rfl:  .  apixaban (ELIQUIS) 5 MG TABS tablet, Take 1 tablet (5 mg total) by mouth 2 (two) times daily., Disp: 60 tablet, Rfl: 3 .  atorvastatin (LIPITOR) 80 MG tablet, Take 1 tablet (80 mg total) by mouth at bedtime., Disp: 30 tablet, Rfl: 1 .  Biotin 10 MG CAPS, Take 10 mg by mouth daily. , Disp: , Rfl:  .  Blood Glucose Monitoring Suppl (ACCU-CHEK AVIVA) device, by Other route. Use as instructed, Disp: , Rfl:  .  carvedilol (COREG) 25 MG tablet, Take 1 tablet (25 mg total) by mouth 2 (two) times daily with a meal., Disp: 30 tablet, Rfl: 1 .  colchicine 0.6 MG tablet, TAKE 1 TABLET BY MOUTH EVERY OTHER DAY TO PREVENT FLARES, Disp: , Rfl:  .  empagliflozin (JARDIANCE) 10 MG TABS tablet, Take 1 tablet (10 mg total) by mouth daily., Disp: 30 tablet, Rfl: 1 .  ferrous sulfate 325 (65 FE) MG tablet, Take 1 tablet (325 mg total) by mouth daily with breakfast., Disp: 90 tablet, Rfl: 3 .  gabapentin (NEURONTIN) 300 MG capsule, Take 300 mg by mouth daily. , Disp: , Rfl:  .  glucose blood test strip, Use as instructed to test blood sugars 3 times daily E11.65  Freestyle lite, Disp: 400 each, Rfl: 3 .  insulin glargine (LANTUS) 100 UNIT/ML injection, Inject 88 Units into the skin daily., Disp: , Rfl:  .  insulin lispro (HUMALOG KWIKPEN) 200 UNIT/ML KwikPen, Inject 28 Units into the skin 3 (three) times daily., Disp: 30 mL, Rfl: 6 .  Insulin Pen Needle 32G X 4 MM MISC, 1 Device by Does not  apply route 4 (four) times daily., Disp: 400 each, Rfl: 3 .  isosorbide mononitrate (IMDUR) 30 MG 24 hr tablet, TAKE 1 TABLET BY MOUTH ONCE DAILY, Disp: 30 tablet, Rfl: 1 .  levothyroxine (SYNTHROID, LEVOTHROID) 75 MCG tablet, Take 75 mcg by mouth daily before breakfast., Disp: , Rfl:  .  losartan (COZAAR) 50 MG tablet, TAKE 1 TABLET BY MOUTH AT BEDTIME, Disp: 30 tablet, Rfl: 1 .  Multiple Vitamins-Minerals (ALIVE ONCE DAILY WOMENS 50+ PO), Take 1 tablet by mouth daily., Disp: , Rfl:  .  nitroGLYCERIN (NITROSTAT) 0.4 MG SL  tablet, DISSOLVE ONE TABLET UNDER THE TONGUE EVERY 5 MINUTES AS NEEDED FOR CHEST PAIN. (Patient not taking: Reported on 02/11/2020), Disp: 25 tablet, Rfl: 3 .  Omega-3 Fatty Acids (FISH OIL PO), Take 1 capsule by mouth daily., Disp: , Rfl:  .  potassium chloride (KLOR-CON) 10 MEQ tablet, Take 2 tablets (20 mEq total) by mouth daily., Disp: 60 tablet, Rfl: 1 .  spironolactone (ALDACTONE) 25 MG tablet, Take 1 tablet (25 mg total) by mouth daily., Disp: 30 tablet, Rfl: 3 .  torsemide (DEMADEX) 20 MG tablet, Take 2 tablets (40 mg total) by mouth daily., Disp: 60 tablet, Rfl: 3 .  Travoprost, BAK Free, (TRAVATAN) 0.004 % SOLN ophthalmic solution, Place 1 drop into both eyes at bedtime. , Disp: , Rfl:  Allergies  Allergen Reactions  . Other Other (See Comments)    Blood products- Patient won't accept any  . Sulfa Antibiotics Rash  . Uloric [Febuxostat] Rash     Social History   Socioeconomic History  . Marital status: Widowed    Spouse name: Not on file  . Number of children: 3  . Years of education: Not on file  . Highest education level: Some college, no degree  Occupational History  . Occupation: retired  Tobacco Use  . Smoking status: Former Smoker    Packs/day: 1.00    Years: 22.00    Pack years: 22.00    Types: Cigarettes    Quit date: 1983    Years since quitting: 38.8  . Smokeless tobacco: Never Used  Vaping Use  . Vaping Use: Never used  Substance and Sexual Activity  . Alcohol use: Yes    Comment: 05/22/2018 "glass of wine couple times/wk"  . Drug use: Not Currently  . Sexual activity: Not Currently    Birth control/protection: None, Post-menopausal  Other Topics Concern  . Not on file  Social History Narrative  . Not on file   Social Determinants of Health   Financial Resource Strain:   . Difficulty of Paying Living Expenses: Not on file  Food Insecurity:   . Worried About Charity fundraiser in the Last Year: Not on file  . Ran Out of Food in the Last Year:  Not on file  Transportation Needs:   . Lack of Transportation (Medical): Not on file  . Lack of Transportation (Non-Medical): Not on file  Physical Activity:   . Days of Exercise per Week: Not on file  . Minutes of Exercise per Session: Not on file  Stress:   . Feeling of Stress : Not on file  Social Connections:   . Frequency of Communication with Friends and Family: Not on file  . Frequency of Social Gatherings with Friends and Family: Not on file  . Attends Religious Services: Not on file  . Active Member of Clubs or Organizations: Not on file  . Attends Archivist Meetings: Not on file  .  Marital Status: Not on file  Intimate Partner Violence:   . Fear of Current or Ex-Partner: Not on file  . Emotionally Abused: Not on file  . Physically Abused: Not on file  . Sexually Abused: Not on file    Physical Exam Vitals reviewed.  Constitutional:      Appearance: She is normal weight.  HENT:     Head: Normocephalic.     Nose: Nose normal.     Mouth/Throat:     Mouth: Mucous membranes are moist.     Pharynx: Oropharynx is clear.  Eyes:     Pupils: Pupils are equal, round, and reactive to light.  Cardiovascular:     Rate and Rhythm: Normal rate and regular rhythm.     Pulses: Normal pulses.     Heart sounds: Normal heart sounds.  Pulmonary:     Effort: Pulmonary effort is normal.     Breath sounds: Normal breath sounds.  Abdominal:     General: Abdomen is flat.     Palpations: Abdomen is soft.  Musculoskeletal:        General: Swelling present. Normal range of motion.     Cervical back: Normal range of motion.     Right lower leg: Edema present.     Left lower leg: Edema present.  Skin:    General: Skin is warm and dry.     Capillary Refill: Capillary refill takes less than 2 seconds.  Neurological:     General: No focal deficit present.     Mental Status: She is alert. Mental status is at baseline.  Psychiatric:        Mood and Affect: Mood normal.     Arrived for home visit for [place]  who was alert and oriented reporting she was feeling good but sore from Prep Program and Chiropractor. Vitals were obtained. BP lower than usual, Danielle [name] reports she has had diarrhea over the last 4 days due to taking colchicine daily as instructed over the last week. This week she begins medication MWF. I encouraged her to be sure she is staying properly hydrated but not over doing it. She verbalized understanding. Medications were reviewed and confirmed. 2 pill boxes filled accordingly. Swelling noted in both lower legs, no more than normal for patient. Home visit complete. I will see patient in two weeks.   Refills- -Levothyroxine -Eliquis -Spironolactone      Future Appointments  Date Time Provider [place]  04/04/2020 10:30 AM Rankin, Clent Demark, MD RDE-R[facility] None  04/15/2020 10:10 AM Shamleffer, Melanie Crazier, MD [facility] None  04/25/2020 11:20 AM Bensimhon, Shaune Pascal, MD [facility] None  06/03/2020  7:05 AM [facility] DEVICE REMOTES [facility]  09/02/2020  7:05 AM [facility] DEVICE REMOTES [facility]  12/02/2020  7:05 AM [facility] DEVICE REMOTES [facility]  03/03/2021  7:05 AM [facility] DEVICE REMOTES [facility]  06/02/2021  7:05 AM [facility] DEVICE REMOTES [facility]  09/01/2021  7:05 AM [facility] DEVICE REMOTES [facility]     ACTION: Home visit completed Next visit planned for two weeks

## 2020-04-04 ENCOUNTER — Encounter (INDEPENDENT_AMBULATORY_CARE_PROVIDER_SITE_OTHER): Payer: Self-pay | Admitting: Ophthalmology

## 2020-04-04 ENCOUNTER — Ambulatory Visit (INDEPENDENT_AMBULATORY_CARE_PROVIDER_SITE_OTHER): Payer: Medicare HMO | Admitting: Ophthalmology

## 2020-04-04 ENCOUNTER — Other Ambulatory Visit: Payer: Self-pay

## 2020-04-04 DIAGNOSIS — G4733 Obstructive sleep apnea (adult) (pediatric): Secondary | ICD-10-CM | POA: Diagnosis not present

## 2020-04-04 DIAGNOSIS — E113412 Type 2 diabetes mellitus with severe nonproliferative diabetic retinopathy with macular edema, left eye: Secondary | ICD-10-CM | POA: Diagnosis not present

## 2020-04-04 DIAGNOSIS — E113411 Type 2 diabetes mellitus with severe nonproliferative diabetic retinopathy with macular edema, right eye: Secondary | ICD-10-CM | POA: Diagnosis not present

## 2020-04-04 NOTE — Progress Notes (Signed)
04/04/2020     CHIEF COMPLAINT Patient presents for Retina Follow Up   HISTORY OF PRESENT ILLNESS: Danielle [name] is a 74 y.o. female who presents to the clinic today for:   HPI    Retina Follow Up    Patient presents with  Diabetic Retinopathy.  In left eye.  Severity is severe.  Duration of 4 months.  Since onset it is stable.  I, the attending physician,  performed the HPI with the patient and updated documentation appropriately.          Comments    4 Month f\u OU. OCT  Pt c/o floaters in OS and sees them often. Pt states when she sees the sun it causes vision to look "like static on the TV". BGL: did not check       Last edited by Tilda Franco on 04/04/2020 10:09 AM. (History)      Referring physician: Seward Carol, MD [address] [place],  [place] [address]  HISTORICAL INFORMATION:   Selected notes from the MEDICAL RECORD NUMBER    Lab Results  Component Value Date   HGBA1C 8.8 (A) 10/19/2019     CURRENT MEDICATIONS: Current Outpatient Medications (Ophthalmic Drugs)  Medication Sig  . Travoprost, BAK Free, (TRAVATAN) 0.004 % SOLN ophthalmic solution Place 1 drop into both eyes at bedtime.    No current facility-administered medications for this visit. (Ophthalmic Drugs)   Current Outpatient Medications (Other)  Medication Sig  . acetaminophen (TYLENOL) 500 MG tablet Take 500-1,000 mg by mouth every 6 (six) hours as needed (for pain or headaches).  Marland Kitchen allopurinol (ZYLOPRIM) 100 MG tablet Take 1 tablet (100 mg total) by mouth daily.  Marland Kitchen apixaban (ELIQUIS) 5 MG TABS tablet Take 1 tablet (5 mg total) by mouth 2 (two) times daily.  Marland Kitchen atorvastatin (LIPITOR) 80 MG tablet Take 1 tablet (80 mg total) by mouth at bedtime.  . Biotin 10 MG CAPS Take 10 mg by mouth daily.   . Blood Glucose Monitoring Suppl (ACCU-CHEK AVIVA) device by Other route. Use as instructed  . carvedilol (COREG) 25 MG tablet Take 1 tablet (25 mg total) by mouth 2 (two) times  daily with a meal.  . colchicine 0.6 MG tablet TAKE 1 TABLET BY MOUTH EVERY OTHER DAY TO PREVENT FLARES  . empagliflozin (JARDIANCE) 10 MG TABS tablet Take 1 tablet (10 mg total) by mouth daily.  . ferrous sulfate 325 (65 FE) MG tablet Take 1 tablet (325 mg total) by mouth daily with breakfast.  . gabapentin (NEURONTIN) 300 MG capsule Take 300 mg by mouth daily.   Marland Kitchen glucose blood test strip Use as instructed to test blood sugars 3 times daily E11.65  Freestyle lite  . insulin glargine (LANTUS) 100 UNIT/ML injection Inject 88 Units into the skin daily.  . insulin lispro (HUMALOG KWIKPEN) 200 UNIT/ML KwikPen Inject 28 Units into the skin 3 (three) times daily.  . Insulin Pen Needle 32G X 4 MM MISC 1 Device by Does not apply route 4 (four) times daily.  . isosorbide mononitrate (IMDUR) 30 MG 24 hr tablet TAKE 1 TABLET BY MOUTH ONCE DAILY  . levothyroxine (SYNTHROID, LEVOTHROID) 75 MCG tablet Take 75 mcg by mouth daily before breakfast.  . losartan (COZAAR) 50 MG tablet TAKE 1 TABLET BY MOUTH AT BEDTIME  . Multiple Vitamins-Minerals (ALIVE ONCE DAILY WOMENS 50+ PO) Take 1 tablet by mouth daily.  . nitroGLYCERIN (NITROSTAT) 0.4 MG SL tablet DISSOLVE ONE TABLET UNDER THE  TONGUE EVERY 5 MINUTES AS NEEDED FOR CHEST PAIN. (Patient not taking: Reported on 02/11/2020)  . Omega-3 Fatty Acids (FISH OIL PO) Take 1 capsule by mouth daily.  . potassium chloride (KLOR-CON) 10 MEQ tablet Take 2 tablets (20 mEq total) by mouth daily.  Marland Kitchen spironolactone (ALDACTONE) 25 MG tablet Take 1 tablet (25 mg total) by mouth daily.  Marland Kitchen torsemide (DEMADEX) 20 MG tablet Take 2 tablets (40 mg total) by mouth daily.   No current facility-administered medications for this visit. (Other)      REVIEW OF SYSTEMS: ROS    Positive for: Endocrine   Last edited by Tilda Franco on 04/04/2020 10:04 AM. (History)       ALLERGIES Allergies  Allergen Reactions  . Other Other (See Comments)    Blood products- Patient won't  accept any  . Sulfa Antibiotics Rash  . Uloric [Febuxostat] Rash    PAST MEDICAL HISTORY Past Medical History:  Diagnosis Date  . 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 Acute kidney failure ([place]) 05/15/2018  . Arthritis    "all over" (05/22/2018)  . Blood dyscrasia    per pt-has small blood cells-appears as if anemic  . Breast cancer, left breast ([place]) 1985  . CHF (congestive heart failure) ([place])   . Complication of anesthesia    difficult to awaken from per pt  . Coronary artery disease   . Dyspnea   . Essential hypertension   . Heart disease   . Heart murmur   . History of gout   . Hypothyroidism   . Obesity (BMI 30-39.9) 11/11/2017  . OSA (obstructive sleep apnea) 02/18/2017    severe obstructive sleep apnea with an AHI of 75.6/h and mild central sleep apnea with a CAI of 7.7/h.  Oxygen saturations dropped as low as 82%.   He is on CPAP at 9 cm H2O.  . OSA on CPAP   . Personal history of chemotherapy   . Personal history of radiation therapy   . Presence of permanent cardiac pacemaker   . Primary localized osteoarthritis of right knee 05/13/2018  . Refusal of blood transfusions as patient is Jehovah's Witness   . Type II diabetes mellitus ([place])    Past Surgical History:  Procedure Laterality Date  . BREAST BIOPSY Left 1985  . BREAST LUMPECTOMY Left 1985  . BREAST LUMPECTOMY WITH NEEDLE LOCALIZATION AND AXILLARY LYMPH NODE DISSECTION  1985  . CATARACT EXTRACTION W/ INTRAOCULAR LENS  IMPLANT, BILATERAL Bilateral   . CORONARY ANGIOPLASTY WITH STENT PLACEMENT  2010, 2012,2017    2 done 2010 and 1 replaced 2012 and 2 replaced in 2017  . DILATION AND CURETTAGE OF UTERUS    . INSERT / REPLACE / REMOVE PACEMAKER  07/19/2014  . JOINT REPLACEMENT    . REPLACEMENT TOTAL KNEE Left 09/2012  . THYROIDECTOMY, PARTIAL  1978  . TONSILLECTOMY    . TOTAL KNEE ARTHROPLASTY  Right 05/13/2018   Procedure: TOTAL KNEE ARTHROPLASTY;  Surgeon: Marchia Bond, MD;  Location: [facility];  Service: Orthopedics;  Laterality: Right;  . TUBAL LIGATION      FAMILY HISTORY Family History  Problem Relation Age of Onset  . Multiple myeloma Mother   . Heart disease Father   . Other Sister   . Heart disease Brother   . Diabetes Sister   . Other Sister     SOCIAL HISTORY Social History  Tobacco Use  . Smoking status: Former Smoker    Packs/day: 1.00    Years: 22.00    Pack years: 22.00    Types: Cigarettes    Quit date: 1983    Years since quitting: 38.8  . Smokeless tobacco: Never Used  Vaping Use  . Vaping Use: Never used  Substance Use Topics  . Alcohol use: Yes    Comment: 05/22/2018 "glass of wine couple times/wk"  . Drug use: Not Currently         OPHTHALMIC EXAM: Base Eye Exam    Visual Acuity (Snellen - Linear)      Right Left   Dist cc 20/30 -2 20/40 -2   Dist ph cc  20/30   Correction: Glasses       Tonometry (Tonopen, 10:10 AM)      Right Left   Pressure 13 10       Pupils      Pupils Dark Light Shape React APD   Right PERRL 3 2 Round Brisk None   Left PERRL 3 2 Round Brisk None       Visual Fields (Counting fingers)      Left Right     Full   Restrictions Partial outer superior temporal deficiency        Neuro/Psych    Oriented x3: Yes   Mood/Affect: Normal       Dilation    Both eyes: 1.0% Mydriacyl, 2.5% Phenylephrine @ 10:11 AM        Slit Lamp and Fundus Exam    External Exam      Right Left   External Normal Normal       Slit Lamp Exam      Right Left   Lids/Lashes Normal Normal   Conjunctiva/Sclera White and quiet White and quiet   Cornea Clear Clear   Anterior Chamber Deep and quiet Deep and quiet   Iris Round and reactive Round and reactive   Lens Centered posterior chamber intraocular lens, Open posterior capsule Centered posterior chamber intraocular lens, Open posterior capsule   Anterior Vitreous  Normal Normal       Fundus Exam      Right Left   Posterior Vitreous Normal Normal   Disc Normal Normal   C/D Ratio 0.75 0.75   Macula Microaneurysms, no macular thickening Microaneurysms, macular thickening, Severe clinically significant macular edema centered threatening but not center involved OS., Exudates   Vessels NPDR- Moderate NPDR- Moderate   Periphery Normal Normal          IMAGING AND PROCEDURES  Imaging and Procedures for 04/04/20  OCT, Retina - OU - Both Eyes       Right Eye Quality was good. Scan locations included subfoveal. Central Foveal Thickness: 273.   Left Eye Quality was good. Scan locations included subfoveal. Central Foveal Thickness: 266. Progression has been stable. Findings include cystoid macular edema.   Notes Minor retinal thickening nasal to the fovea right eye, overall stable will observe  Left eye area of temporal CSME persist despite focal laser treatment and previous Avastin.  Will repeat consider repeat intravitreal Avastin left eye and followed thereafter by possible focal laser treatment to the actual microaneurysms as compared to grid laser                ASSESSMENT/PLAN:  Severe nonproliferative diabetic retinopathy of right eye, with macular edema, associated with type 2 diabetes mellitus (HCC) CSME OD minor, not center involved will observe  Severe  nonproliferative diabetic retinopathy of left eye, with macular edema, associated with type 2 diabetes mellitus [facility]) Status post Avastin June 2021, followed by focal laser grid fashion July 2021.  Persistent retinal thickening continues with hard exudate deposition which could threaten the fovea.      ICD-10-CM   1. Severe nonproliferative diabetic retinopathy of left eye, with macular edema, associated with type 2 diabetes mellitus (HCC)  P69.2493 OCT, Retina - OU - Both Eyes  2. OSA (obstructive sleep apnea)  G47.33   3. Severe nonproliferative diabetic retinopathy of right  eye, with macular edema, associated with type 2 diabetes mellitus ([place])  E11.3411     1.  Follow-up 1 week left eye, injection intravitreal Avastin  2.  We will schedule focal laser treatment temporally left eye some 2 to 4 weeks thereafter  3.  Ophthalmic Meds Ordered this visit:  No orders of the defined types were placed in this encounter.      Return in about 1 week (around 04/11/2020) for dilate, OS, AVASTIN OCT.  There are no Patient Instructions on file for this visit.   Explained the diagnoses, plan, and follow up with the patient and they expressed understanding.  Patient expressed understanding of the importance of proper follow up care.   Clent Demark Langley Flatley M.D. Diseases & Surgery of the Retina and Vitreous Retina & Diabetic [place] 04/04/20     Abbreviations: M myopia (nearsighted); A astigmatism; H hyperopia (farsighted); P presbyopia; Mrx spectacle prescription;  CTL contact lenses; OD right eye; OS left eye; OU both eyes  XT exotropia; ET esotropia; PEK punctate epithelial keratitis; PEE punctate epithelial erosions; DES dry eye syndrome; MGD meibomian gland dysfunction; ATs artificial tears; PFAT's preservative free artificial tears; [place] nuclear sclerotic cataract; PSC posterior subcapsular cataract; ERM epi-retinal membrane; PVD posterior vitreous detachment; RD retinal detachment; DM diabetes mellitus; DR diabetic retinopathy; NPDR non-proliferative diabetic retinopathy; PDR proliferative diabetic retinopathy; CSME clinically significant macular edema; DME diabetic macular edema; dbh dot blot hemorrhages; CWS cotton wool spot; POAG primary open angle glaucoma; C/D cup-to-disc ratio; HVF humphrey visual field; GVF goldmann visual field; OCT optical coherence tomography; IOP intraocular pressure; BRVO Branch retinal vein occlusion; CRVO central retinal vein occlusion; CRAO central retinal artery occlusion; BRAO branch retinal artery occlusion; RT retinal tear; SB scleral  buckle; PPV pars plana vitrectomy; VH Vitreous hemorrhage; PRP panretinal laser photocoagulation; IVK intravitreal kenalog; VMT vitreomacular traction; MH Macular hole;  NVD neovascularization of the disc; NVE neovascularization elsewhere; AREDS age related eye disease study; ARMD age related macular degeneration; POAG primary open angle glaucoma; EBMD epithelial/anterior basement membrane dystrophy; ACIOL anterior chamber intraocular lens; IOL intraocular lens; PCIOL posterior chamber intraocular lens; Phaco/IOL phacoemulsification with intraocular lens placement; [place] photorefractive keratectomy; LASIK laser assisted in situ keratomileusis; HTN hypertension; DM diabetes mellitus; COPD chronic obstructive pulmonary disease

## 2020-04-04 NOTE — Assessment & Plan Note (Signed)
CSME OD minor, not center involved will observe

## 2020-04-04 NOTE — Assessment & Plan Note (Signed)
Status post Avastin June 2021, followed by focal laser grid fashion July 2021.  Persistent retinal thickening continues with hard exudate deposition which could threaten the fovea.

## 2020-04-04 NOTE — Progress Notes (Signed)
[facility] [employer] PREP Weekly Session   Patient Details  Name: Danielle [name] MRN: [id number] Date of Birth: Mar 24, 1946 Age: 74 y.o. PCP: Seward Carol, MD  Vitals:   04/04/20 1651  Weight: 214 lb (97.1 kg)     [facility] [employer] Weekly seesion - 04/04/20 1600      Weekly Session   Topic Discussed Health habits   sugar demo   Minutes exercised this week --   not recorded    Classes attended to date 7          Nutrition celebration: baked fish  Barnett Hatter 04/04/2020, 4:51 PM

## 2020-04-08 ENCOUNTER — Other Ambulatory Visit (HOSPITAL_COMMUNITY): Payer: Self-pay | Admitting: Internal Medicine

## 2020-04-11 NOTE — Progress Notes (Signed)
[facility] PREP Weekly Session   Patient Details  Name: Danielle [name] MRN: [id number] Date of Birth: [birth year] Age: 74 y.o. PCP: Seward Carol, MD  Vitals:   04/11/20 1630  Weight: 218 lb (98.9 kg)     [facility] [employer] Weekly seesion - 04/11/20 1600      Weekly Session   Topic Discussed Eating for the season;Restaurant Eating   sodium demo   Minutes exercised this week 30 minutes   did not count time spent in strength training    Classes attended to date 59          Grateful for: Less pain Nutrition celebration: carrots and apples for snacks Struggles: balance   Barnett Hatter 04/11/2020, 4:34 PM

## 2020-04-13 ENCOUNTER — Other Ambulatory Visit: Payer: Self-pay

## 2020-04-13 ENCOUNTER — Encounter (INDEPENDENT_AMBULATORY_CARE_PROVIDER_SITE_OTHER): Payer: Self-pay | Admitting: Ophthalmology

## 2020-04-13 ENCOUNTER — Ambulatory Visit (INDEPENDENT_AMBULATORY_CARE_PROVIDER_SITE_OTHER): Payer: Medicare HMO | Admitting: Ophthalmology

## 2020-04-13 DIAGNOSIS — E113412 Type 2 diabetes mellitus with severe nonproliferative diabetic retinopathy with macular edema, left eye: Secondary | ICD-10-CM | POA: Diagnosis not present

## 2020-04-13 MED ORDER — BEVACIZUMAB CHEMO INJECTION 1.25MG/0.05ML SYRINGE FOR KALEIDOSCOPE
1.2500 mg | INTRAVITREAL | Status: AC | PRN
  Administered 2020-04-13: 1.25 mg via INTRAVITREAL

## 2020-04-13 NOTE — Progress Notes (Signed)
04/13/2020     CHIEF COMPLAINT Patient presents for Retina Follow Up   HISTORY OF PRESENT ILLNESS: Danielle [name] is a 74 y.o. female who presents to the clinic today for:   HPI    Retina Follow Up    Patient presents with  Diabetic Retinopathy.  In left eye.  This started 1 week ago.  Severity is mild.  Duration of 1 week.  Since onset it is stable.          Comments    1 Week F/U OS, Avastin OS  Pt denies noticeable changes to [place] OU since last visit. Pt denies ocular pain, flashes of light, or floaters OU.  LBS: pt did not check       Last edited by Rockie Neighbours, [place] on 04/13/2020  9:43 AM. (History)      Referring physician: Seward Carol, MD [address] [place],  [place] [address]  HISTORICAL INFORMATION:   Selected notes from the MEDICAL RECORD NUMBER    Lab Results  Component Value Date   HGBA1C 8.8 (A) 10/19/2019     CURRENT MEDICATIONS: Current Outpatient Medications (Ophthalmic Drugs)  Medication Sig  . Travoprost, BAK Free, (TRAVATAN) 0.004 % SOLN ophthalmic solution Place 1 drop into both eyes at bedtime.    No current facility-administered medications for this visit. (Ophthalmic Drugs)   Current Outpatient Medications (Other)  Medication Sig  . acetaminophen (TYLENOL) 500 MG tablet Take 500-1,000 mg by mouth every 6 (six) hours as needed (for pain or headaches).  Marland Kitchen allopurinol (ZYLOPRIM) 100 MG tablet Take 1 tablet (100 mg total) by mouth daily.  Marland Kitchen atorvastatin (LIPITOR) 80 MG tablet Take 1 tablet (80 mg total) by mouth at bedtime.  . Biotin 10 MG CAPS Take 10 mg by mouth daily.   . Blood Glucose Monitoring Suppl (ACCU-CHEK AVIVA) device by Other route. Use as instructed  . carvedilol (COREG) 25 MG tablet Take 1 tablet (25 mg total) by mouth 2 (two) times daily with a meal.  . colchicine 0.6 MG tablet TAKE 1 TABLET BY MOUTH EVERY OTHER DAY TO PREVENT FLARES  . ELIQUIS 5 MG TABS tablet Take 1 tablet by mouth twice daily  .  empagliflozin (JARDIANCE) 10 MG TABS tablet Take 1 tablet (10 mg total) by mouth daily.  . ferrous sulfate 325 (65 FE) MG tablet Take 1 tablet (325 mg total) by mouth daily with breakfast.  . gabapentin (NEURONTIN) 300 MG capsule Take 300 mg by mouth daily.   Marland Kitchen glucose blood test strip Use as instructed to test blood sugars 3 times daily E11.65  Freestyle lite  . insulin glargine (LANTUS) 100 UNIT/ML injection Inject 88 Units into the skin daily.  . insulin lispro (HUMALOG KWIKPEN) 200 UNIT/ML KwikPen Inject 28 Units into the skin 3 (three) times daily.  . Insulin Pen Needle 32G X 4 MM MISC 1 Device by Does not apply route 4 (four) times daily.  . isosorbide mononitrate (IMDUR) 30 MG 24 hr tablet TAKE 1 TABLET BY MOUTH ONCE DAILY  . levothyroxine (SYNTHROID, LEVOTHROID) 75 MCG tablet Take 75 mcg by mouth daily before breakfast.  . losartan (COZAAR) 50 MG tablet TAKE 1 TABLET BY MOUTH AT BEDTIME  . Multiple Vitamins-Minerals (ALIVE ONCE DAILY WOMENS 50+ PO) Take 1 tablet by mouth daily.  . nitroGLYCERIN (NITROSTAT) 0.4 MG SL tablet DISSOLVE ONE TABLET UNDER THE TONGUE EVERY 5 MINUTES AS NEEDED FOR CHEST PAIN. (Patient not taking: Reported on 02/11/2020)  . Omega-3  Fatty Acids (FISH OIL PO) Take 1 capsule by mouth daily.  . potassium chloride (KLOR-CON) 10 MEQ tablet Take 2 tablets (20 mEq total) by mouth daily.  Marland Kitchen spironolactone (ALDACTONE) 25 MG tablet Take 1 tablet by mouth once daily  . torsemide (DEMADEX) 20 MG tablet Take 2 tablets (40 mg total) by mouth daily.   No current facility-administered medications for this visit. (Other)      REVIEW OF SYSTEMS:    ALLERGIES Allergies  Allergen Reactions  . Other Other (See Comments)    Blood products- Patient won't accept any  . Sulfa Antibiotics Rash  . Uloric [Febuxostat] Rash    PAST MEDICAL HISTORY Past Medical History:  Diagnosis Date  . Acute combined systolic and diastolic congestive heart failure ([place]) 02/05/2017  . Acute  gout due to renal impairment involving right wrist 05/20/2018  . Acute kidney failure ([place]) 05/15/2018  . Arthritis    "all over" (05/22/2018)  . Blood dyscrasia    per pt-has small blood cells-appears as if anemic  . Breast cancer, left breast ([place]) 1985  . CHF (congestive heart failure) ([place])   . Complication of anesthesia    difficult to awaken from per pt  . Coronary artery disease   . Dyspnea   . Essential hypertension   . Heart disease   . Heart murmur   . History of gout   . Hypothyroidism   . Obesity (BMI 30-39.9) 11/11/2017  . OSA (obstructive sleep apnea) 02/18/2017    severe obstructive sleep apnea with an AHI of 75.6/h and mild central sleep apnea with a CAI of 7.7/h.  Oxygen saturations dropped as low as 82%.   He is on CPAP at 9 cm H2O.  . OSA on CPAP   . Personal history of chemotherapy   . Personal history of radiation therapy   . Presence of permanent cardiac pacemaker   . Primary localized osteoarthritis of right knee 05/13/2018  . Refusal of blood transfusions as patient is Jehovah's Witness   . Type II diabetes mellitus ([place])    Past Surgical History:  Procedure Laterality Date  . BREAST BIOPSY Left 1985  . BREAST LUMPECTOMY Left 1985  . BREAST LUMPECTOMY WITH NEEDLE LOCALIZATION AND AXILLARY LYMPH NODE DISSECTION  1985  . CATARACT EXTRACTION W/ INTRAOCULAR LENS  IMPLANT, BILATERAL Bilateral   . CORONARY ANGIOPLASTY WITH STENT PLACEMENT  2010, 2012,2017    2 done 2010 and 1 replaced 2012 and 2 replaced in 2017  . DILATION AND CURETTAGE OF UTERUS    . INSERT / REPLACE / REMOVE PACEMAKER  07/19/2014  . JOINT REPLACEMENT    . REPLACEMENT TOTAL KNEE Left 09/2012  . THYROIDECTOMY, PARTIAL  1978  . TONSILLECTOMY    . TOTAL KNEE ARTHROPLASTY Right 05/13/2018   Procedure: TOTAL KNEE ARTHROPLASTY;  Surgeon: Marchia Bond, MD;  Location: [facility];  Service: Orthopedics;  Laterality: Right;  . TUBAL LIGATION      FAMILY HISTORY Family History  Problem Relation  Age of Onset  . Multiple myeloma Mother   . Heart disease Father   . Other Sister   . Heart disease Brother   . Diabetes Sister   . Other Sister     SOCIAL HISTORY Social History   Tobacco Use  . Smoking status: Former Smoker    Packs/day: 1.00    Years: 22.00    Pack years: 22.00    Types: Cigarettes    Quit date: 1983    Years since quitting: 38.9  .  Smokeless tobacco: Never Used  Vaping Use  . Vaping Use: Never used  Substance Use Topics  . Alcohol use: Yes    Comment: 05/22/2018 "glass of wine couple times/wk"  . Drug use: Not Currently         OPHTHALMIC EXAM:  Base Eye Exam    Visual Acuity (ETDRS)      Right Left   Dist cc 20/30 +2 20/40 +2   Dist ph cc NI 20/25 -2   Correction: Glasses       Tonometry (Tonopen, 9:43 AM)      Right Left   Pressure 15 18       Pupils      Pupils Dark Light Shape React APD   Right PERRL 3 2 Round Brisk None   Left PERRL 3 2 Round Brisk None       Visual Fields (Counting fingers)      Left Right     Full   Restrictions Partial outer superior temporal deficiency        Extraocular Movement      Right Left    Full Full       Neuro/Psych    Oriented x3: Yes   Mood/Affect: Normal       Dilation    Left eye: 1.0% Mydriacyl, 2.5% Phenylephrine @ 9:46 AM        Slit Lamp and Fundus Exam    External Exam      Right Left   External Normal Normal       Slit Lamp Exam      Right Left   Lids/Lashes Normal Normal   Conjunctiva/Sclera White and quiet White and quiet   Cornea Clear Clear   Anterior Chamber Deep and quiet Deep and quiet   Iris Round and reactive Round and reactive   Lens Centered posterior chamber intraocular lens, Open posterior capsule Centered posterior chamber intraocular lens, Open posterior capsule   Anterior Vitreous Normal Normal       Fundus Exam      Right Left   Posterior Vitreous  Normal   Disc  Normal   C/D Ratio  0.75   Macula  Microaneurysms, macular thickening, Severe  clinically significant macular edema centered threatening but not center involved OS., Exudates   Vessels  NPDR- Moderate   Periphery  Normal          IMAGING AND PROCEDURES  Imaging and Procedures for 04/13/20  OCT, Retina - OU - Both Eyes       Right Eye Quality was good. Scan locations included subfoveal. Central Foveal Thickness: 276. Progression has been stable.   Left Eye Quality was good. Scan locations included subfoveal. Central Foveal Thickness: 271.   Notes Extra foveal CSME OS, will treat today with intravitreal Avastin       Intravitreal Injection, Pharmacologic Agent - OS - Left Eye       Time Out 04/13/2020. 10:41 AM. Confirmed correct patient, procedure, site, and patient consented.   Anesthesia Topical anesthesia was used. Anesthetic medications included [place] 3.5%.   Procedure Preparation included Tobramycin 0.3%, 10% betadine to eyelids, 5% betadine to ocular surface. A 30 gauge needle was used.   Injection:  1.25 mg Bevacizumab (AVASTIN) SOLN   NDC: 70360-001-02, Lot: [id number]   Route: Intravitreal, Site: Left Eye, Waste: 0 mg  Post-op Post injection exam found visual acuity of at least counting fingers. The patient tolerated the procedure well. There were no complications. The patient received written and  verbal post procedure care education. Post injection medications were not given.                 ASSESSMENT/PLAN:  Severe nonproliferative diabetic retinopathy of left eye, with macular edema, associated with type 2 diabetes mellitus (HCC) CSME, extra foveal temporally, will need to control left eye with intravitreal Avastin followed soon thereafter by focal laser      ICD-10-CM   1. Severe nonproliferative diabetic retinopathy of left eye, with macular edema, associated with type 2 diabetes mellitus (HCC)  P82.4235 OCT, Retina - OU - Both Eyes    Intravitreal Injection, Pharmacologic Agent - OS - Left Eye    Bevacizumab (AVASTIN)  SOLN 1.25 mg    1.  CSME OS, extra foveal recurrent status post injection Avastin June 2021, and focal laser will deliver intravitreal Avastin OS today  2.  3.  Ophthalmic Meds Ordered this visit:  Meds ordered this encounter  Medications  . Bevacizumab (AVASTIN) SOLN 1.25 mg       Return in about 6 weeks (around 05/25/2020) for dilate, OS, AVASTIN OCT.  There are no Patient Instructions on file for this visit.   Explained the diagnoses, plan, and follow up with the patient and they expressed understanding.  Patient expressed understanding of the importance of proper follow up care.   Clent Demark Francois Elk M.D. Diseases & Surgery of the Retina and Vitreous [facility] [place] 04/13/20     Abbreviations: M myopia (nearsighted); A astigmatism; H hyperopia (farsighted); P presbyopia; Mrx spectacle prescription;  CTL contact lenses; OD right eye; OS left eye; OU both eyes  XT exotropia; ET esotropia; PEK punctate epithelial keratitis; PEE punctate epithelial erosions; DES dry eye syndrome; MGD meibomian gland dysfunction; ATs artificial tears; PFAT's preservative free artificial tears; [place] nuclear sclerotic cataract; PSC posterior subcapsular cataract; ERM epi-retinal membrane; PVD posterior vitreous detachment; RD retinal detachment; DM diabetes mellitus; DR diabetic retinopathy; NPDR non-proliferative diabetic retinopathy; PDR proliferative diabetic retinopathy; CSME clinically significant macular edema; DME diabetic macular edema; dbh dot blot hemorrhages; CWS cotton wool spot; POAG primary open angle glaucoma; C/D cup-to-disc ratio; HVF humphrey visual field; GVF goldmann visual field; OCT optical coherence tomography; IOP intraocular pressure; BRVO Branch retinal vein occlusion; CRVO central retinal vein occlusion; CRAO central retinal artery occlusion; BRAO branch retinal artery occlusion; RT retinal tear; SB scleral buckle; PPV pars plana vitrectomy; VH Vitreous hemorrhage; PRP  panretinal laser photocoagulation; IVK intravitreal kenalog; VMT vitreomacular traction; MH Macular hole;  NVD neovascularization of the disc; NVE neovascularization elsewhere; AREDS age related eye disease study; ARMD age related macular degeneration; POAG primary open angle glaucoma; EBMD epithelial/anterior basement membrane dystrophy; ACIOL anterior chamber intraocular lens; IOL intraocular lens; PCIOL posterior chamber intraocular lens; Phaco/IOL phacoemulsification with intraocular lens placement; [place] photorefractive keratectomy; LASIK laser assisted in situ keratomileusis; HTN hypertension; DM diabetes mellitus; COPD chronic obstructive pulmonary disease

## 2020-04-13 NOTE — Assessment & Plan Note (Signed)
CSME, extra foveal temporally, will need to control left eye with intravitreal Avastin followed soon thereafter by focal laser

## 2020-04-15 ENCOUNTER — Ambulatory Visit: Payer: Medicare HMO | Admitting: Internal Medicine

## 2020-04-15 NOTE — Progress Notes (Deleted)
Name: Danielle [name]  Age/ Sex: 74 y.o., female   MRN/ DOB: [id number], 27-Apr-1946     PCP: Seward Carol, MD   Reason for Endocrinology Evaluation: Type 2 Diabetes Mellitus  Initial Endocrine Consultative Visit: 05/14/2019    PATIENT IDENTIFIER: Danielle [name] is a 74 y.o. female with a past medical history of T2DM, HTN, Dyslipidemia, CAD, Hx of Breast Ca (S/P chemo and lumpectomy)  . The patient has followed with Endocrinology clinic since 05/14/2019 for consultative assistance with management of her diabetes.  DIABETIC HISTORY:  Danielle [name] was diagnosed with T2DM > 20 yrs ago. Has been on  Metformin in the past which caused diarrhea, has been on prandial insulin in the past  . Her hemoglobin A1c has ranged from 6.9% in 2019, peaking at 8.0% in 2020.  On her initial visit to our clinic her A1c was 11.2% . She was on Jardiance and Lantus 130 units daily. I reduced her lantus to 80 units, added prandial insulin.   Jardriance initiated  By cardiology 09/2019  SUBJECTIVE:   During the last visit (10/19/2019): A1c 8.8%. We continued jardiance and adjusted MDI regimen.      Today (04/15/2020): Danielle [name] is here for a follow up on diabetes due to hyperglycemia. She checks her blood sugars 1 times daily. The patient has not had hypoglycemic episodes since the last clinic visit. She did not bring her meter today.       HOME DIABETES REGIMEN:   Lantus 75 units daily   Humalog 25 units with each meal   Jardiance 10 mg daily    Statin: Yes ACE-I/ARB:Yes Prior Diabetic Education: yes  METER DOWNLOAD SUMMARY: Did not bring today      DIABETIC COMPLICATIONS: Microvascular complications:   Neuropathy, CKD III, retinopathy (S/P laser)   Last eye exam: Completed 04/2019  Macrovascular complications:   CAD ( S/P MI and stent placement)  Denies: PVD, CVA    HISTORY:  Past Medical History:  Past Medical History:  Diagnosis Date  . Acute combined systolic and  diastolic congestive heart failure ([place]) 02/05/2017  . 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 Acute kidney failure ([place]) 05/15/2018  . Arthritis    "all over" (05/22/2018)  . Blood dyscrasia    per pt-has small blood cells-appears as if anemic  . Breast cancer, left breast ([place]) 1985  . CHF (congestive heart failure) ([place])   . Complication of anesthesia    difficult to awaken from per pt  . Coronary artery disease   . Dyspnea   . Essential hypertension   . Heart disease   . Heart murmur   . History of gout   . Hypothyroidism   . Obesity (BMI 30-39.9) 11/11/2017  . OSA (obstructive sleep apnea) 02/18/2017    severe obstructive sleep apnea with an AHI of 75.6/h and mild central sleep apnea with a CAI of 7.7/h.  Oxygen saturations dropped as low as 82%.   He is on CPAP at 9 cm H2O.  . OSA on CPAP   . Personal history of chemotherapy   . Personal history of radiation therapy   . Presence of permanent cardiac pacemaker   . Primary localized osteoarthritis of right knee 05/13/2018  . Refusal of blood transfusions as patient is Jehovah's Witness   . Type II diabetes mellitus ([place])    Past Surgical History:  Past Surgical History:  Procedure Laterality Date  . BREAST BIOPSY Left 1985  . BREAST  LUMPECTOMY Left 1985  . BREAST LUMPECTOMY WITH NEEDLE LOCALIZATION AND AXILLARY LYMPH NODE DISSECTION  1985  . CATARACT EXTRACTION W/ INTRAOCULAR LENS  IMPLANT, BILATERAL Bilateral   . CORONARY ANGIOPLASTY WITH STENT PLACEMENT  2010, 2012,2017    2 done 2010 and 1 replaced 2012 and 2 replaced in 2017  . DILATION AND CURETTAGE OF UTERUS    . INSERT / REPLACE / REMOVE PACEMAKER  07/19/2014  . JOINT REPLACEMENT    . REPLACEMENT TOTAL KNEE Left 09/2012  . THYROIDECTOMY, PARTIAL  1978  . TONSILLECTOMY    . TOTAL KNEE ARTHROPLASTY Right 05/13/2018   Procedure: TOTAL KNEE ARTHROPLASTY;  Surgeon: Marchia Bond, MD;  Location: [facility];  Service: Orthopedics;  Laterality:  Right;  . TUBAL LIGATION      Social History:  reports that she quit smoking about 38 years ago. Her smoking use included cigarettes. She has a 22.00 pack-year smoking history. She has never used smokeless tobacco. She reports current alcohol use. She reports previous drug use. Family History:  Family History  Problem Relation Age of Onset  . Multiple myeloma Mother   . Heart disease Father   . Other Sister   . Heart disease Brother   . Diabetes Sister   . Other Sister      HOME MEDICATIONS: Allergies as of 04/15/2020      Reactions   Other Other (See Comments)   Blood products- Patient won't accept any   Sulfa Antibiotics Rash   Uloric [febuxostat] Rash      Medication List       Accurate as of April 15, 2020  7:17 AM. If you have any questions, ask your nurse or doctor.        Accu-Chek Aviva device by Other route. Use as instructed   acetaminophen 500 MG tablet Commonly known as: TYLENOL Take 500-1,000 mg by mouth every 6 (six) hours as needed (for pain or headaches).   ALIVE ONCE DAILY WOMENS 50+ PO Take 1 tablet by mouth daily.   allopurinol 100 MG tablet Commonly known as: ZYLOPRIM Take 1 tablet (100 mg total) by mouth daily.   atorvastatin 80 MG tablet Commonly known as: LIPITOR Take 1 tablet (80 mg total) by mouth at bedtime.   Biotin 10 MG Caps Take 10 mg by mouth daily.   carvedilol 25 MG tablet Commonly known as: COREG Take 1 tablet (25 mg total) by mouth 2 (two) times daily with a meal.   colchicine 0.6 MG tablet TAKE 1 TABLET BY MOUTH EVERY OTHER DAY TO PREVENT FLARES   Eliquis 5 MG Tabs tablet Generic drug: apixaban Take 1 tablet by mouth twice daily   empagliflozin 10 MG Tabs tablet Commonly known as: Jardiance Take 1 tablet (10 mg total) by mouth daily.   ferrous sulfate 325 (65 FE) MG tablet Take 1 tablet (325 mg total) by mouth daily with breakfast.   FISH OIL PO Take 1 capsule by mouth daily.   gabapentin 300 MG  capsule Commonly known as: NEURONTIN Take 300 mg by mouth daily.   glucose blood test strip Use as instructed to test blood sugars 3 times daily E11.65  Freestyle lite   HumaLOG KwikPen 200 UNIT/ML KwikPen Generic drug: insulin lispro Inject 28 Units into the skin 3 (three) times daily.   Insulin Pen Needle 32G X 4 MM Misc 1 Device by Does not apply route 4 (four) times daily.   isosorbide mononitrate 30 MG 24 hr tablet Commonly known as: IMDUR TAKE  1 TABLET BY MOUTH ONCE DAILY   Lantus 100 UNIT/ML injection Generic drug: insulin glargine Inject 88 Units into the skin daily.   levothyroxine 75 MCG tablet Commonly known as: SYNTHROID Take 75 mcg by mouth daily before breakfast.   losartan 50 MG tablet Commonly known as: COZAAR TAKE 1 TABLET BY MOUTH AT BEDTIME   nitroGLYCERIN 0.4 MG SL tablet Commonly known as: NITROSTAT DISSOLVE ONE TABLET UNDER THE TONGUE EVERY 5 MINUTES AS NEEDED FOR CHEST PAIN.   potassium chloride 10 MEQ tablet Commonly known as: KLOR-CON Take 2 tablets (20 mEq total) by mouth daily.   spironolactone 25 MG tablet Commonly known as: ALDACTONE Take 1 tablet by mouth once daily   torsemide 20 MG tablet Commonly known as: DEMADEX Take 2 tablets (40 mg total) by mouth daily.   Travoprost (BAK Free) 0.004 % Soln ophthalmic solution Commonly known as: TRAVATAN Place 1 drop into both eyes at bedtime.        OBJECTIVE:   Vital Signs: There were no vitals taken for this visit.  Wt Readings from Last 3 Encounters:  04/11/20 218 lb (98.9 kg)  04/04/20 214 lb (97.1 kg)  03/31/20 219 lb (99.3 kg)   PHYSICAL EXAM: VS: There were no vitals taken for this visit.   EXAM: General: Pt appears well and is in NAD  Extremities: Gait and station: normal gait  Digits and nails: no clubbing, cyanosis, petechiae, or nodes Head and neck: normal alignment and mobility BL UE: normal ROM and strength, no joint enlargement or tenderness BL LE: 2+  pretibial  edema   Skin: Hair: texture and amount normal with gender appropriate distribution Skin Inspection: no rashes   Mental Status: Judgment, insight: intact Orientation: oriented to time, place, and person Mood and affect: no depression, anxiety, or agitation    DM Foot Exam 10/19/2019 The skin of the feet is intact without sores or ulcerations. The pedal pulses unable to palpate due to edema  The sensation is decreased to a screening 5.07, 10 gram monofilament bilaterally at the toes    DATA REVIEWED:  Lab Results  Component Value Date   HGBA1C 8.8 (A) 10/19/2019   HGBA1C 6.9 (H) 05/20/2018   Lab Results  Component Value Date   LDLCALC 65 10/20/2019   CREATININE 1.73 (H) 01/21/2020     Lab Results  Component Value Date   CHOL 150 10/20/2019   HDL 44 10/20/2019   LDLCALC 65 10/20/2019   TRIG 206 (H) 10/20/2019   CHOLHDL 3.4 10/20/2019         ASSESSMENT / PLAN / RECOMMENDATIONS:   1)) Type 2 Diabetes Mellitus, with Improved Glycemic control, With Neuropathic, CKD III and Macrovascular  complications - Most recent A1c of 8.8 %. Goal A1c < 7.5 %.   - A1c down from 11.2 % - She misses her basal insulin on average once a week, I have suggested switching the timing to morning , that way she doesn;t fall asleep and not take it.  - This morning she had juice, I have advised he to avoid juice, unless BG is < 70 mg/dL.  Discussed sugar- free or no sugar added options.     MEDICATIONS: Continue Lantus 88 units ONCE daily  Increase Humalog to 28  units with each meal  She is on Jardiance 10 mg daily per cardiology  EDUCATION / INSTRUCTIONS:  BG monitoring instructions: Patient is instructed to check her blood sugars 4 times a day, before each meal and bedtime.  Call [facility] if: BG persistently < 70 or > 300. . I reviewed the Rule of 15 for the treatment of hypoglycemia in detail with the patient. Literature supplied.   F/U in 6 months   Signed  electronically by: Mack Guise, MD  [facility]  [facility] [address] [place], [place] [address] Phone: [phone] FAX: [phone]   CC: Seward Carol, [place] [address] [place] [address] Phone: [phone]  Fax: [phone]  Return to Endocrinology clinic as below: Future Appointments  Date Time Provider [place]  04/15/2020 10:10 AM Larya Charpentier, Melanie Crazier, MD [facility] None  04/25/2020 11:20 AM Bensimhon, Shaune Pascal, MD [facility] None  05/25/2020 10:00 AM Rankin, Clent Demark, MD [facility] None  06/03/2020  7:05 AM [facility] DEVICE REMOTES [facility]  09/02/2020  7:05 AM [facility] DEVICE REMOTES [facility]  12/02/2020  7:05 AM [facility] DEVICE REMOTES [facility]  03/03/2021  7:05 AM [facility] DEVICE REMOTES [facility]  06/02/2021  7:05 AM [facility] DEVICE REMOTES [facility]  09/01/2021  7:05 AM [facility] DEVICE REMOTES [facility]

## 2020-04-18 ENCOUNTER — Other Ambulatory Visit (HOSPITAL_COMMUNITY): Payer: Self-pay | Admitting: Internal Medicine

## 2020-04-18 ENCOUNTER — Other Ambulatory Visit (HOSPITAL_COMMUNITY): Payer: Self-pay

## 2020-04-18 NOTE — Progress Notes (Unsigned)
[facility] PREP Weekly Session   Patient Details  Name: Danielle [name] MRN: [id number] Date of Birth: Jan 15, 1946 Age: 74 y.o. PCP: Seward Carol, MD  Vitals:   04/18/20 1638  Weight: 218 lb (98.9 kg)     [facility] Weekly seesion - 04/18/20 1600      Weekly Session   Topic Discussed Stress management and problem solving   Half-way goals, meditation, stretch   Classes attended to date 80          Fun things since last meeting: visit a friend in a nursing home Grateful for: walking without a walker Discussed ways to help ease constipation; more fruit, water, lemon water.   Barnett Hatter 04/18/2020, 4:39 PM

## 2020-04-18 NOTE — Progress Notes (Signed)
Paramedicine Encounter    Patient ID: Danielle [name], female    DOB: 1945-07-25, 75 y.o.   MRN: [id number]   Patient Care Team: Seward Carol, MD as PCP - General (Internal Medicine) Constance Haw, MD as PCP - Electrophysiology (Cardiology) Sueanne Margarita, MD as PCP - Sleep Medicine (Cardiology) Jorge Ny, LCSW as Social Worker (Licensed Clinical Social Worker)  Patient Active Problem List   Diagnosis Date Noted  . Severe nonproliferative diabetic retinopathy of left eye, with macular edema, associated with type 2 diabetes mellitus ([place]) 11/05/2019  . Severe nonproliferative diabetic retinopathy of right eye, with macular edema, associated with type 2 diabetes mellitus ([place]) 11/05/2019  . Retinal hemorrhage of right eye 11/05/2019  . Retinal hemorrhage of left eye 11/05/2019  . Type 2 diabetes mellitus with hyperglycemia, with long-term current use of insulin ([place]) 05/14/2019  . Diabetes mellitus ([place]) 05/14/2019  . 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, with long-term current use of insulin ([place]) 05/14/2019  . Type 2 diabetes mellitus with diabetic polyneuropathy, with long-term current use of insulin ([place]) 05/14/2019  . Encephalopathy acute 05/20/2018  . 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 Acute kidney failure ([place]) 05/15/2018  . Primary localized osteoarthritis of right knee 05/13/2018  . (HFpEF) heart failure with preserved ejection fraction ([place]) 12/20/2017  . Obesity (BMI 30-39.9) 11/11/2017  . OSA (obstructive sleep apnea) 02/18/2017  . Essential hypertension   . Diabetes mellitus without complication ([place])   . Cancer ([place])   . Thyroid disease     Current Outpatient Medications:  .  acetaminophen (TYLENOL) 500 MG tablet, Take 500-1,000 mg by mouth every 6 (six) hours as needed (for pain or headaches)., Disp: , Rfl:  .  allopurinol (ZYLOPRIM) 100 MG tablet, Take 1 tablet (100 mg total) by mouth daily., Disp: ,  Rfl:  .  atorvastatin (LIPITOR) 80 MG tablet, Take 1 tablet (80 mg total) by mouth at bedtime., Disp: 30 tablet, Rfl: 1 .  Biotin 10 MG CAPS, Take 10 mg by mouth daily. , Disp: , Rfl:  .  Blood Glucose Monitoring Suppl (ACCU-CHEK AVIVA) device, by Other route. Use as instructed, Disp: , Rfl:  .  carvedilol (COREG) 25 MG tablet, Take 1 tablet (25 mg total) by mouth 2 (two) times daily with a meal., Disp: 30 tablet, Rfl: 1 .  colchicine 0.6 MG tablet, TAKE 1 TABLET BY MOUTH EVERY OTHER DAY TO PREVENT FLARES, Disp: , Rfl:  .  ELIQUIS 5 MG TABS tablet, Take 1 tablet by mouth twice daily, Disp: 60 tablet, Rfl: 0 .  empagliflozin (JARDIANCE) 10 MG TABS tablet, Take 1 tablet (10 mg total) by mouth daily., Disp: 30 tablet, Rfl: 1 .  ferrous sulfate 325 (65 FE) MG tablet, Take 1 tablet (325 mg total) by mouth daily with breakfast., Disp: 90 tablet, Rfl: 3 .  gabapentin (NEURONTIN) 300 MG capsule, Take 300 mg by mouth daily. , Disp: , Rfl:  .  glucose blood test strip, Use as instructed to test blood sugars 3 times daily E11.65  Freestyle lite, Disp: 400 each, Rfl: 3 .  insulin glargine (LANTUS) 100 UNIT/ML injection, Inject 88 Units into the skin daily., Disp: , Rfl:  .  insulin lispro (HUMALOG KWIKPEN) 200 UNIT/ML KwikPen, Inject 28 Units into the skin 3 (three) times daily., Disp: 30 mL, Rfl: 6 .  Insulin Pen Needle 32G X 4 MM MISC, 1 Device by Does not apply route 4 (four) times daily.,  Disp: 400 each, Rfl: 3 .  isosorbide mononitrate (IMDUR) 30 MG 24 hr tablet, TAKE 1 TABLET BY MOUTH ONCE DAILY, Disp: 30 tablet, Rfl: 1 .  levothyroxine (SYNTHROID, LEVOTHROID) 75 MCG tablet, Take 75 mcg by mouth daily before breakfast., Disp: , Rfl:  .  losartan (COZAAR) 50 MG tablet, TAKE 1 TABLET BY MOUTH AT BEDTIME, Disp: 30 tablet, Rfl: 1 .  Multiple Vitamins-Minerals (ALIVE ONCE DAILY WOMENS 50+ PO), Take 1 tablet by mouth daily., Disp: , Rfl:  .  nitroGLYCERIN (NITROSTAT) 0.4 MG SL tablet, DISSOLVE ONE TABLET UNDER  THE TONGUE EVERY 5 MINUTES AS NEEDED FOR CHEST PAIN. (Patient not taking: Reported on 02/11/2020), Disp: 25 tablet, Rfl: 3 .  Omega-3 Fatty Acids (FISH OIL PO), Take 1 capsule by mouth daily., Disp: , Rfl:  .  potassium chloride (KLOR-CON) 10 MEQ tablet, Take 2 tablets (20 mEq total) by mouth daily., Disp: 60 tablet, Rfl: 1 .  spironolactone (ALDACTONE) 25 MG tablet, Take 1 tablet by mouth once daily, Disp: 30 tablet, Rfl: 0 .  torsemide (DEMADEX) 20 MG tablet, Take 2 tablets (40 mg total) by mouth daily., Disp: 60 tablet, Rfl: 3 .  Travoprost, BAK Free, (TRAVATAN) 0.004 % SOLN ophthalmic solution, Place 1 drop into both eyes at bedtime. , Disp: , Rfl:  Allergies  Allergen Reactions  . Other Other (See Comments)    Blood products- Patient won't accept any  . Sulfa Antibiotics Rash  . Uloric [Febuxostat] Rash     Social History   Socioeconomic History  . Marital status: Widowed    Spouse name: Not on file  . Number of children: 3  . Years of education: Not on file  . Highest education level: Some college, no degree  Occupational History  . Occupation: retired  Tobacco Use  . Smoking status: Former Smoker    Packs/day: 1.00    Years: 22.00    Pack years: 22.00    Types: Cigarettes    Quit date: 1983    Years since quitting: 38.9  . Smokeless tobacco: Never Used  Vaping Use  . Vaping Use: Never used  Substance and Sexual Activity  . Alcohol use: Yes    Comment: 05/22/2018 "glass of wine couple times/wk"  . Drug use: Not Currently  . Sexual activity: Not Currently    Birth control/protection: None, Post-menopausal  Other Topics Concern  . Not on file  Social History Narrative  . Not on file   Social Determinants of Health   Financial Resource Strain:   . Difficulty of Paying Living Expenses: Not on file  Food Insecurity:   . Worried About Charity fundraiser in the Last Year: Not on file  . Ran Out of Food in the Last Year: Not on file  Transportation Needs:   . Lack  of Transportation (Medical): Not on file  . Lack of Transportation (Non-Medical): Not on file  Physical Activity:   . Days of Exercise per Week: Not on file  . Minutes of Exercise per Session: Not on file  Stress:   . Feeling of Stress : Not on file  Social Connections:   . Frequency of Communication with Friends and Family: Not on file  . Frequency of Social Gatherings with Friends and Family: Not on file  . Attends Religious Services: Not on file  . Active Member of Clubs or Organizations: Not on file  . Attends Archivist Meetings: Not on file  . Marital Status: Not on file  Intimate Partner Violence:   . Fear of Current or Ex-Partner: Not on file  . Emotionally Abused: Not on file  . Physically Abused: Not on file  . Sexually Abused: Not on file    Physical Exam Vitals reviewed.  Constitutional:      Appearance: She is normal weight.  HENT:     Head: Normocephalic.     Nose: Nose normal.     Mouth/Throat:     Mouth: Mucous membranes are moist.     Pharynx: Oropharynx is clear.  Eyes:     Pupils: Pupils are equal, round, and reactive to light.  Cardiovascular:     Rate and Rhythm: Normal rate and regular rhythm.     Pulses: Normal pulses.     Heart sounds: Normal heart sounds.  Pulmonary:     Effort: Pulmonary effort is normal.     Breath sounds: Normal breath sounds.  Abdominal:     General: Abdomen is flat.     Palpations: Abdomen is soft.  Musculoskeletal:        General: Swelling present. Normal range of motion.     Cervical back: Normal range of motion.     Right lower leg: No edema.     Left lower leg: No edema.  Skin:    General: Skin is warm and dry.     Capillary Refill: Capillary refill takes less than 2 seconds.  Neurological:     General: No focal deficit present.     Mental Status: She is alert. Mental status is at baseline.  Psychiatric:        Mood and Affect: Mood normal.        Behavior: Behavior normal.        Thought Content:  Thought content normal.        Judgment: Judgment normal.     Arrived for home visit for Danielle [name] who was alert and oriented reporting she was sore after her work out at [employer] with her PREP program in which she goes to twice a week. Danielle [name] reports she has been pain free overall and has been feeling so much better lately. Danielle [name] reports she has been compliant with her medications but notices her sugar dropping in the mornings. I advised her that it is important for her to eat regularly especially with working out and taking insulin and Jardiance. I advised her to call her Endocrinologist to make them aware of this as well of informing them of her recent uptake in exercising. She agreed and stated she would call them tomorrow. I advised her to contact me if she needed any assistance with this.   Danielle [name] and I plan to meet at her appointment with the [facility] on Monday at 1120. Danielle [name]'s medications were reviewed and confirmed. 2 pill boxes filled accordingly with multiple medications needed. I will call in refills and leave written instructions for where and how to place in box.   Home visit complete. I will see Danielle [name] Monday in clinic.    Refills:  Torsemide Potassium Spironolactone Eliquis Carvedilol Jardiance   Future Appointments  Date Time Provider [name]  04/25/2020 11:20 AM Bensimhon, Shaune Pascal, MD [facility] None  05/25/2020 10:00 AM Rankin, Clent Demark, MD [facility] None  06/03/2020  7:05 AM [facility] DEVICE REMOTES [facility]  06/08/2020  9:10 AM Shamleffer, Melanie Crazier, MD [facility] None  09/02/2020  7:05 AM [facility] DEVICE REMOTES [facility]  12/02/2020  7:05 AM [facility] DEVICE REMOTES [facility]  03/03/2021  7:05 AM [facility] DEVICE REMOTES [facility]  06/02/2021  7:05 AM [facility] DEVICE REMOTES [facility]  09/01/2021  7:05 AM [facility] DEVICE REMOTES [facility]  [facility]     ACTION: Home visit completed Next visit planned for one week

## 2020-04-19 ENCOUNTER — Telehealth: Payer: Self-pay | Admitting: Internal Medicine

## 2020-04-19 ENCOUNTER — Other Ambulatory Visit (HOSPITAL_COMMUNITY): Payer: Self-pay | Admitting: *Deleted

## 2020-04-19 MED ORDER — TORSEMIDE 20 MG PO TABS
40.0000 mg | ORAL_TABLET | Freq: Every day | ORAL | 0 refills | Status: DC
Start: 2020-04-19 — End: 2020-04-25

## 2020-04-19 NOTE — Telephone Encounter (Signed)
Unable to make changes to her regimen without detailed info. I will wait until her visit and will look at the meter. BG's above 70 mg/dl are normal

## 2020-04-19 NOTE — Telephone Encounter (Signed)
Message left for patient to return my call.  

## 2020-04-19 NOTE — Telephone Encounter (Signed)
Please call pt back at [phone]

## 2020-04-19 NOTE — Telephone Encounter (Signed)
Spoken to patient and notified Dr Shamleffer's comments. Verbalized understanding.   

## 2020-04-19 NOTE — Telephone Encounter (Signed)
Spoken to patient. She stated that she noticed that her blood sugar readings have been lower than she use to getting. It have been ranging from 117 to 61.Nothing over 150. Patient did not have her meter in front her to give me readings. Patient stated that she is currently going to the gym twice a week. She also noticed that when she forget her insulin before bed when she checks in the morning it get below 90. Patient canceled her appointment last week and I did reschedule her to 05/04/2020

## 2020-04-19 NOTE — Telephone Encounter (Signed)
Per pt call pt states for a week blood sugars run low around 117 or 90 please advise. Pt ph [phone].

## 2020-04-23 NOTE — Progress Notes (Signed)
Advanced Heart Failure Clinic Note    Primary Care: Dr. Delfina Redwood Primary Cardiologist: Dr. Haroldine Laws  EP: Dr Tomasa Blase Rheumatology: Dr Posey Rea  HPI:  Danielle [name] is a 74 y.o. female with a past medical history of systolicHF (EF 80-99%), CAD s/p multiple stents in [place] (with h/o LAD stent thrombosis), ischemic cardiomyopathy (EF 10% at one point), symptomatic bradycardia s/p PPM, HTN, DM, hypothyroidism, and OSA.  Admitted 02/03/17-02/06/17 with SOB, acute on chronic systolic CHF. Diuresed with IV lasix (12 pounds), discharge weight was 215 pounds.   Had R TKR 12/19.   Today she returns for HF follow up. Continues to follow with Paramedicine. Danielle [name] is here with her today. Feels great. Going to [employer]. Going to gym 2x/week. Using the exercise bike and does weights and stretching. Feels great. Gets around much easier. Breathing much improved. No edema, orthopnea or PND. BP has been great. 110/70s. Weight stable at home.   ECHO 01/2017- EF 45-50% Grade 2DD Left Atrium Severely dilated.  ECHO 12/2017 EF 45-50%, G2DD, LA mildly dilated, trileaflet moderately thick aortic valve  Review of systems complete and found to be negative unless listed in HPI.    Past Medical History:  Diagnosis Date  . Acute combined systolic and diastolic congestive heart failure ([place]) 02/05/2017  . 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 Acute kidney failure ([place]) 05/15/2018  . Arthritis    "all over" (05/22/2018)  . Blood dyscrasia    per pt-has small blood cells-appears as if anemic  . Breast cancer, left breast ([place]) 1985  . CHF (congestive heart failure) ([place])   . Complication of anesthesia    difficult to awaken from per pt  . Coronary artery disease   . Dyspnea   . Essential hypertension   . Heart disease   . Heart murmur   . History of gout   . Hypothyroidism   . Obesity (BMI 30-39.9) 11/11/2017  . OSA (obstructive sleep apnea) 02/18/2017    severe obstructive  sleep apnea with an AHI of 75.6/h and mild central sleep apnea with a CAI of 7.7/h.  Oxygen saturations dropped as low as 82%.   He is on CPAP at 9 cm H2O.  . OSA on CPAP   . Personal history of chemotherapy   . Personal history of radiation therapy   . Presence of permanent cardiac pacemaker   . Primary localized osteoarthritis of right knee 05/13/2018  . Refusal of blood transfusions as patient is Jehovah's Witness   . Type II diabetes mellitus ([place])     Current Outpatient Medications  Medication Sig Dispense Refill  . acetaminophen (TYLENOL) 500 MG tablet Take 500-1,000 mg by mouth every 6 (six) hours as needed (for pain or headaches).    Marland Kitchen allopurinol (ZYLOPRIM) 100 MG tablet Take 1 tablet (100 mg total) by mouth daily.    Marland Kitchen atorvastatin (LIPITOR) 80 MG tablet Take 1 tablet (80 mg total) by mouth at bedtime. 30 tablet 1  . Biotin 10 MG CAPS Take 10 mg by mouth daily.     . Blood Glucose Monitoring Suppl (ACCU-CHEK AVIVA) device by Other route. Use as instructed    . carvedilol (COREG) 25 MG tablet Take 1 tablet (25 mg total) by mouth 2 (two) times daily with a meal. 30 tablet 1  . colchicine 0.6 MG tablet TAKE 1 TABLET BY MOUTH EVERY OTHER DAY TO PREVENT FLARES    . ELIQUIS 5 MG TABS tablet Take 1 tablet by mouth  twice daily 60 tablet 0  . empagliflozin (JARDIANCE) 10 MG TABS tablet Take 1 tablet (10 mg total) by mouth daily. 30 tablet 1  . ferrous sulfate 325 (65 FE) MG tablet Take 1 tablet (325 mg total) by mouth daily with breakfast. 90 tablet 3  . gabapentin (NEURONTIN) 300 MG capsule Take 300 mg by mouth daily.     Marland Kitchen glucose blood test strip Use as instructed to test blood sugars 3 times daily E11.65  Freestyle lite 400 each 3  . insulin glargine (LANTUS) 100 UNIT/ML injection Inject 75 Units into the skin daily.    . insulin lispro (HUMALOG KWIKPEN) 200 UNIT/ML KwikPen Inject 28 Units into the skin 3 (three) times daily. 30 mL 6  . Insulin Pen Needle 32G X 4 MM MISC 1 Device by  Does not apply route 4 (four) times daily. 400 each 3  . isosorbide mononitrate (IMDUR) 30 MG 24 hr tablet TAKE 1 TABLET BY MOUTH ONCE DAILY 30 tablet 1  . levothyroxine (SYNTHROID, LEVOTHROID) 75 MCG tablet Take 75 mcg by mouth daily before breakfast.    . losartan (COZAAR) 50 MG tablet TAKE 1 TABLET BY MOUTH AT BEDTIME 30 tablet 1  . Multiple Vitamins-Minerals (ALIVE ONCE DAILY WOMENS 50+ PO) Take 1 tablet by mouth daily.    . nitroGLYCERIN (NITROSTAT) 0.4 MG SL tablet DISSOLVE ONE TABLET UNDER THE TONGUE EVERY 5 MINUTES AS NEEDED FOR CHEST PAIN. 25 tablet 3  . Omega-3 Fatty Acids (FISH OIL PO) Take 1 capsule by mouth daily.    . potassium chloride (KLOR-CON) 10 MEQ tablet Take 2 tablets (20 mEq total) by mouth daily. 60 tablet 1  . spironolactone (ALDACTONE) 25 MG tablet Take 1 tablet by mouth once daily 30 tablet 0  . torsemide (DEMADEX) 20 MG tablet Take 2 tablets by mouth once daily 60 tablet 0  . Travoprost, BAK Free, (TRAVATAN) 0.004 % SOLN ophthalmic solution Place 1 drop into both eyes at bedtime.      No current facility-administered medications for this encounter.   Allergies  Allergen Reactions  . Other Other (See Comments)    Blood products- Patient won't accept any  . Sulfa Antibiotics Rash  . Uloric [Febuxostat] Rash   Social History   Socioeconomic History  . Marital status: Widowed    Spouse name: Not on file  . Number of children: 3  . Years of education: Not on file  . Highest education level: Some college, no degree  Occupational History  . Occupation: retired  Tobacco Use  . Smoking status: Former Smoker    Packs/day: 1.00    Years: 22.00    Pack years: 22.00    Types: Cigarettes    Quit date: 1983    Years since quitting: 38.9  . Smokeless tobacco: Never Used  Vaping Use  . Vaping Use: Never used  Substance and Sexual Activity  . Alcohol use: Yes    Comment: 05/22/2018 "glass of wine couple times/wk"  . Drug use: Not Currently  . Sexual activity:  Not Currently    Birth control/protection: None, Post-menopausal  Other Topics Concern  . Not on file  Social History Narrative  . Not on file   Social Determinants of Health   Financial Resource Strain:   . Difficulty of Paying Living Expenses: Not on file  Food Insecurity:   . Worried About Charity fundraiser in the Last Year: Not on file  . Ran Out of Food in the Last Year: Not  on file  Transportation Needs:   . Lack of Transportation (Medical): Not on file  . Lack of Transportation (Non-Medical): Not on file  Physical Activity:   . Days of Exercise per Week: Not on file  . Minutes of Exercise per Session: Not on file  Stress:   . Feeling of Stress : Not on file  Social Connections:   . Frequency of Communication with Friends and Family: Not on file  . Frequency of Social Gatherings with Friends and Family: Not on file  . Attends Religious Services: Not on file  . Active Member of Clubs or Organizations: Not on file  . Attends Archivist Meetings: Not on file  . Marital Status: Not on file  Intimate Partner Violence:   . Fear of Current or Ex-Partner: Not on file  . Emotionally Abused: Not on file  . Physically Abused: Not on file  . Sexually Abused: Not on file   Family History  Problem Relation Age of Onset  . Multiple myeloma Mother   . Heart disease Father   . Other Sister   . Heart disease Brother   . Diabetes Sister   . Other Sister    Vitals:   04/25/20 1136  BP: (!) 150/80  Pulse: 65  SpO2: 98%  Weight: 102.1 kg (225 lb)     Wt Readings from Last 3 Encounters:  04/25/20 102.1 kg (225 lb)  04/18/20 98.9 kg (218 lb)  04/11/20 98.9 kg (218 lb)    PHYSICAL EXAM: General:  Well appearing. No resp difficulty HEENT: normal Neck: supple. no JVD. Carotids 2+ bilat; no bruits. No lymphadenopathy or thryomegaly appreciated. Cor: PMI nondisplaced. Regular rate & rhythm. No rubs, gallops or murmurs. Lungs: clear Abdomen: soft, nontender,  nondistended. No hepatosplenomegaly. No bruits or masses. Good bowel sounds. Extremities: no cyanosis, clubbing, rash, edema mild RLE edema (site of TKA) Neuro: alert & orientedx3, cranial nerves grossly intact. moves all 4 extremities w/o difficulty. Affect pleasant   ASSESSMENT & PLAN:  1. Chronic systolic CHF: ICM -  Echo 12/2017 EF 45-50%, G2DD - NYHA II. Volume status much improved.  --Continue  torsemide 40 daily.   - Continue imdur 30 mg daily - Continue losartan 50 mg daily. - Continue spiro 25 mg daily.  - Continue carvedilol 25 mg twice a day. - Continue Jardiance 10 mg daily.  - Check BMET - Due for repeat echo   2. CAD: Stenting in 2010, 2012 and 2016.  - No s/s ischemia - Continue statin. No ASA with Eliquis.  3. Paroxysmal Afib - Regular on exam - Continue Eliquis 5 mg BID. No bleeding - Following in Afib clinic.   4. HTN - Blood pressure well controlled at home. High here but missed meds this am - Continue current regimen.  5. DM - Per Dr. Delfina Redwood.  - Continue Jardiance.   6. H/o sinus pauses s/p PPM - Dr. Curt Bears now following. No change.   7. Gout - Per PCP.  - Has follow up with Rheumatology   8. OSA-  - Continue CPAP nightly.  - Follows with Dr Radford Pax  9. CKD 3b - continue Jardiance - check BMET  Glori Bickers, MD 04/25/20

## 2020-04-25 ENCOUNTER — Other Ambulatory Visit (HOSPITAL_COMMUNITY): Payer: Self-pay

## 2020-04-25 ENCOUNTER — Other Ambulatory Visit: Payer: Self-pay

## 2020-04-25 ENCOUNTER — Encounter (HOSPITAL_COMMUNITY): Payer: Self-pay | Admitting: Internal Medicine

## 2020-04-25 ENCOUNTER — Telehealth (HOSPITAL_COMMUNITY): Payer: Self-pay

## 2020-04-25 ENCOUNTER — Ambulatory Visit (HOSPITAL_COMMUNITY)
Admission: RE | Admit: 2020-04-25 | Discharge: 2020-04-25 | Disposition: A | Discharge status: Home / Self Care | Payer: Medicare HMO | Source: Ambulatory Visit | Attending: Internal Medicine | Admitting: Internal Medicine

## 2020-04-25 VITALS — BP 150/80 | HR 65 | Wt 225.0 lb

## 2020-04-25 DIAGNOSIS — I480 Paroxysmal atrial fibrillation: Secondary | ICD-10-CM | POA: Diagnosis not present

## 2020-04-25 DIAGNOSIS — I130 Hypertensive heart and chronic kidney disease with heart failure and stage 1 through stage 4 chronic kidney disease, or unspecified chronic kidney disease: Secondary | ICD-10-CM | POA: Diagnosis not present

## 2020-04-25 DIAGNOSIS — Z6839 Body mass index (BMI) 39.0-39.9, adult: Secondary | ICD-10-CM | POA: Diagnosis not present

## 2020-04-25 DIAGNOSIS — Z87891 Personal history of nicotine dependence: Secondary | ICD-10-CM | POA: Insufficient documentation

## 2020-04-25 DIAGNOSIS — I5022 Chronic systolic (congestive) heart failure: Secondary | ICD-10-CM | POA: Diagnosis not present

## 2020-04-25 DIAGNOSIS — N179 Acute kidney failure, unspecified: Secondary | ICD-10-CM | POA: Insufficient documentation

## 2020-04-25 DIAGNOSIS — E119 Type 2 diabetes mellitus without complications: Secondary | ICD-10-CM

## 2020-04-25 DIAGNOSIS — M109 Gout, unspecified: Secondary | ICD-10-CM | POA: Insufficient documentation

## 2020-04-25 DIAGNOSIS — I2510 Atherosclerotic heart disease of native coronary artery without angina pectoris: Secondary | ICD-10-CM

## 2020-04-25 DIAGNOSIS — I255 Ischemic cardiomyopathy: Secondary | ICD-10-CM | POA: Diagnosis not present

## 2020-04-25 DIAGNOSIS — Z79899 Other long term (current) drug therapy: Secondary | ICD-10-CM | POA: Diagnosis not present

## 2020-04-25 DIAGNOSIS — E669 Obesity, unspecified: Secondary | ICD-10-CM | POA: Diagnosis not present

## 2020-04-25 DIAGNOSIS — E1122 Type 2 diabetes mellitus with diabetic chronic kidney disease: Secondary | ICD-10-CM | POA: Diagnosis not present

## 2020-04-25 DIAGNOSIS — Z955 Presence of coronary angioplasty implant and graft: Secondary | ICD-10-CM | POA: Insufficient documentation

## 2020-04-25 DIAGNOSIS — Z8249 Family history of ischemic heart disease and other diseases of the circulatory system: Secondary | ICD-10-CM | POA: Diagnosis not present

## 2020-04-25 DIAGNOSIS — I10 Essential (primary) hypertension: Secondary | ICD-10-CM

## 2020-04-25 DIAGNOSIS — G4733 Obstructive sleep apnea (adult) (pediatric): Secondary | ICD-10-CM | POA: Diagnosis not present

## 2020-04-25 DIAGNOSIS — N1832 Chronic kidney disease, stage 3b: Secondary | ICD-10-CM | POA: Insufficient documentation

## 2020-04-25 DIAGNOSIS — Z7901 Long term (current) use of anticoagulants: Secondary | ICD-10-CM | POA: Diagnosis not present

## 2020-04-25 DIAGNOSIS — Z950 Presence of cardiac pacemaker: Secondary | ICD-10-CM | POA: Insufficient documentation

## 2020-04-25 DIAGNOSIS — Z794 Long term (current) use of insulin: Secondary | ICD-10-CM | POA: Diagnosis not present

## 2020-04-25 LAB — BASIC METABOLIC PANEL
Anion gap: 8 (ref 5–15)
BUN: 29 mg/dL — ABNORMAL HIGH (ref 8–23)
CO2: 28 mmol/L (ref 22–32)
Calcium: 9.5 mg/dL (ref 8.9–10.3)
Chloride: 105 mmol/L (ref 98–111)
Creatinine, Ser: 1.45 mg/dL — ABNORMAL HIGH (ref 0.44–1.00)
GFR, Estimated: 38 mL/min — ABNORMAL LOW (ref >60)
Glucose, Bld: 118 mg/dL — ABNORMAL HIGH (ref 70–99)
Potassium: 4.2 mmol/L (ref 3.5–5.1)
Sodium: 141 mmol/L (ref 135–145)

## 2020-04-25 LAB — HEMOGLOBIN A1C
Hgb A1c MFr Bld: 8.2 % — ABNORMAL HIGH (ref 4.8–5.6)
Mean Plasma Glucose: 188.64 mg/dL

## 2020-04-25 NOTE — Telephone Encounter (Signed)
Message left for Danielle [name] this morning reminding her of her appointment today at the [facility].

## 2020-04-25 NOTE — Patient Instructions (Signed)
Labs done today, we will notify you of abnormal results  Your physician has requested that you have an echocardiogram. Echocardiography is a painless test that uses sound waves to create images of your heart. It provides your doctor with information about the size and shape of your heart and how well your heart's chambers and valves are working. This procedure takes approximately one hour. There are no restrictions for this procedure.  Please call our office in April 2022 to schedule your follow up appointment  If you have any questions or concerns before your next appointment please send [place] a message through [place] or call our office at [phone].    TO LEAVE A MESSAGE FOR THE NURSE SELECT OPTION 2, PLEASE LEAVE A MESSAGE INCLUDING: . YOUR NAME . DATE OF BIRTH . CALL BACK NUMBER . REASON FOR CALL**this is important as we prioritize the call backs  [place] AS LONG AS YOU CALL BEFORE 4:00 PM  At the [facility], you and your health needs are our priority. As part of our continuing mission to provide you with exceptional heart care, we have created designated Provider Care Teams. These Care Teams include your primary Cardiologist (physician) and Advanced Practice Providers (APPs- Physician Assistants and Nurse Practitioners) who all work together to provide you with the care you need, when you need it.   You may see any of the following providers on your designated Care Team at your next follow up: Marland Kitchen Dr Glori Bickers . Dr Loralie Champagne . Darrick Grinder, NP . Lyda Jester, PA . Audry Riles, PharmD   Please be sure to bring in all your medications bottles to every appointment.

## 2020-04-25 NOTE — Addendum Note (Signed)
Encounter addended by: Scarlette Calico, RN on: 04/25/2020 12:32 PM  Actions taken: Visit diagnoses modified, Order list changed, Diagnosis association updated, Clinical Note Signed, Charge Capture section accepted

## 2020-04-25 NOTE — Progress Notes (Signed)
Paramedicine Encounter  Arrived for clinic visit for Chilton who was being seen by Dr. Haroldine Laws today. Heinz Knuckles reports she needs repeat ECHO, labs today. Bensimhon recommended I do bi-weekly visits or two months then monthly visits for a few months after that to prepare for discharge. Alexya agreed. Home visit scheduled for Thursday. Derrian agreed. Clinic visit complete.       ACTION: Home visit completed Next visit planned for Thursday

## 2020-04-25 NOTE — Progress Notes (Signed)
[facility] [employer] PREP Weekly Session   Patient Details  Name: Danielle [name] MRN: [id number] Date of Birth: September 24, 1945 Age: 74 y.o. PCP: Seward Carol, MD  There were no vitals filed for this visit.   [employer] Weekly seesion - 04/25/20 1500      Weekly Session   Topic Discussed Expectations and non-scale victories    Classes attended to date 60          Fun things since last meeting: old bro and sis shared stories Grateful for: visit with my bro and sis   Barnett Hatter 04/25/2020, 3:53 PM

## 2020-04-28 ENCOUNTER — Ambulatory Visit (HOSPITAL_COMMUNITY): Admission: RE | Admit: 2020-04-28 | Payer: Medicare HMO | Source: Ambulatory Visit

## 2020-04-28 ENCOUNTER — Telehealth (HOSPITAL_COMMUNITY): Payer: Self-pay | Admitting: Licensed Clinical Social Worker

## 2020-04-28 ENCOUNTER — Other Ambulatory Visit (HOSPITAL_COMMUNITY): Payer: Self-pay

## 2020-04-28 NOTE — Telephone Encounter (Signed)
Pt enrolled with [facility] [employer] services- pt now able to call and make ride requests for all [facility] appts.  Jorge Ny, LCSW Clinical Social Worker Advanced Heart Failure Clinic Desk#: [phone] Cell#: [phone]

## 2020-04-28 NOTE — Progress Notes (Signed)
Paramedicine Encounter    Patient ID: Danielle [name], female    DOB: 06-01-1945, 74 y.o.   MRN: [id number]   Patient Care Team: Seward Carol, MD as PCP - General (Internal Medicine) Constance Haw, MD as PCP - Electrophysiology (Cardiology) Sueanne Margarita, MD as PCP - Sleep Medicine (Cardiology) Jorge Ny, LCSW as Social Worker (Licensed Clinical Social Worker)  Patient Active Problem List   Diagnosis Date Noted  . Severe nonproliferative diabetic retinopathy of left eye, with macular edema, associated with type 2 diabetes mellitus ([place]) 11/05/2019  . Severe nonproliferative diabetic retinopathy of right eye, with macular edema, associated with type 2 diabetes mellitus ([place]) 11/05/2019  . Retinal hemorrhage of right eye 11/05/2019  . Retinal hemorrhage of left eye 11/05/2019  . Type 2 diabetes mellitus with hyperglycemia, with long-term current use of insulin ([place]) 05/14/2019  . Diabetes mellitus ([place]) 05/14/2019  . 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, with long-term current use of insulin ([place]) 05/14/2019  . Type 2 diabetes mellitus with diabetic polyneuropathy, with long-term current use of insulin ([place]) 05/14/2019  . Encephalopathy acute 05/20/2018  . 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 Acute kidney failure ([place]) 05/15/2018  . Primary localized osteoarthritis of right knee 05/13/2018  . (HFpEF) heart failure with preserved ejection fraction ([place]) 12/20/2017  . Obesity (BMI 30-39.9) 11/11/2017  . OSA (obstructive sleep apnea) 02/18/2017  . Essential hypertension   . Diabetes mellitus without complication ([place])   . Cancer ([place])   . Thyroid disease     Current Outpatient Medications:  .  acetaminophen (TYLENOL) 500 MG tablet, Take 500-1,000 mg by mouth every 6 (six) hours as needed (for pain or headaches)., Disp: , Rfl:  .  allopurinol (ZYLOPRIM) 100 MG tablet, Take 1 tablet (100 mg total) by mouth daily., Disp: ,  Rfl:  .  atorvastatin (LIPITOR) 80 MG tablet, Take 1 tablet (80 mg total) by mouth at bedtime., Disp: 30 tablet, Rfl: 1 .  Biotin 10 MG CAPS, Take 10 mg by mouth daily. , Disp: , Rfl:  .  Blood Glucose Monitoring Suppl (ACCU-CHEK AVIVA) device, by Other route. Use as instructed, Disp: , Rfl:  .  carvedilol (COREG) 25 MG tablet, Take 1 tablet (25 mg total) by mouth 2 (two) times daily with a meal., Disp: 30 tablet, Rfl: 1 .  colchicine 0.6 MG tablet, TAKE 1 TABLET BY MOUTH EVERY OTHER DAY TO PREVENT FLARES, Disp: , Rfl:  .  ELIQUIS 5 MG TABS tablet, Take 1 tablet by mouth twice daily, Disp: 60 tablet, Rfl: 0 .  empagliflozin (JARDIANCE) 10 MG TABS tablet, Take 1 tablet (10 mg total) by mouth daily., Disp: 30 tablet, Rfl: 1 .  ferrous sulfate 325 (65 FE) MG tablet, Take 1 tablet (325 mg total) by mouth daily with breakfast., Disp: 90 tablet, Rfl: 3 .  gabapentin (NEURONTIN) 300 MG capsule, Take 300 mg by mouth daily. , Disp: , Rfl:  .  glucose blood test strip, Use as instructed to test blood sugars 3 times daily E11.65  Freestyle lite, Disp: 400 each, Rfl: 3 .  insulin glargine (LANTUS) 100 UNIT/ML injection, Inject 75 Units into the skin daily., Disp: , Rfl:  .  insulin lispro (HUMALOG KWIKPEN) 200 UNIT/ML KwikPen, Inject 28 Units into the skin 3 (three) times daily., Disp: 30 mL, Rfl: 6 .  Insulin Pen Needle 32G X 4 MM MISC, 1 Device by Does not apply route 4 (four) times daily.,  Disp: 400 each, Rfl: 3 .  isosorbide mononitrate (IMDUR) 30 MG 24 hr tablet, TAKE 1 TABLET BY MOUTH ONCE DAILY, Disp: 30 tablet, Rfl: 1 .  levothyroxine (SYNTHROID, LEVOTHROID) 75 MCG tablet, Take 75 mcg by mouth daily before breakfast., Disp: , Rfl:  .  losartan (COZAAR) 50 MG tablet, TAKE 1 TABLET BY MOUTH AT BEDTIME, Disp: 30 tablet, Rfl: 1 .  Multiple Vitamins-Minerals (ALIVE ONCE DAILY WOMENS 50+ PO), Take 1 tablet by mouth daily., Disp: , Rfl:  .  nitroGLYCERIN (NITROSTAT) 0.4 MG SL tablet, DISSOLVE ONE TABLET UNDER  THE TONGUE EVERY 5 MINUTES AS NEEDED FOR CHEST PAIN., Disp: 25 tablet, Rfl: 3 .  Omega-3 Fatty Acids (FISH OIL PO), Take 1 capsule by mouth daily., Disp: , Rfl:  .  potassium chloride (KLOR-CON) 10 MEQ tablet, Take 2 tablets (20 mEq total) by mouth daily., Disp: 60 tablet, Rfl: 1 .  spironolactone (ALDACTONE) 25 MG tablet, Take 1 tablet by mouth once daily, Disp: 30 tablet, Rfl: 0 .  torsemide (DEMADEX) 20 MG tablet, Take 2 tablets by mouth once daily, Disp: 60 tablet, Rfl: 0 .  Travoprost, BAK Free, (TRAVATAN) 0.004 % SOLN ophthalmic solution, Place 1 drop into both eyes at bedtime. , Disp: , Rfl:  Allergies  Allergen Reactions  . Other Other (See Comments)    Blood products- Patient won't accept any  . Sulfa Antibiotics Rash  . Uloric [Febuxostat] Rash     Social History   Socioeconomic History  . Marital status: Widowed    Spouse name: Not on file  . Number of children: 3  . Years of education: Not on file  . Highest education level: Some college, no degree  Occupational History  . Occupation: retired  Tobacco Use  . Smoking status: Former Smoker    Packs/day: 1.00    Years: 22.00    Pack years: 22.00    Types: Cigarettes    Quit date: 1983    Years since quitting: 38.9  . Smokeless tobacco: Never Used  Vaping Use  . Vaping Use: Never used  Substance and Sexual Activity  . Alcohol use: Yes    Comment: 05/22/2018 "glass of wine couple times/wk"  . Drug use: Not Currently  . Sexual activity: Not Currently    Birth control/protection: None, Post-menopausal  Other Topics Concern  . Not on file  Social History Narrative  . Not on file   Social Determinants of Health   Financial Resource Strain:   . Difficulty of Paying Living Expenses: Not on file  Food Insecurity:   . Worried About Charity fundraiser in the Last Year: Not on file  . Ran Out of Food in the Last Year: Not on file  Transportation Needs:   . Lack of Transportation (Medical): Not on file  . Lack of  Transportation (Non-Medical): Not on file  Physical Activity:   . Days of Exercise per Week: Not on file  . Minutes of Exercise per Session: Not on file  Stress:   . Feeling of Stress : Not on file  Social Connections:   . Frequency of Communication with Friends and Family: Not on file  . Frequency of Social Gatherings with Friends and Family: Not on file  . Attends Religious Services: Not on file  . Active Member of Clubs or Organizations: Not on file  . Attends Archivist Meetings: Not on file  . Marital Status: Not on file  Intimate Partner Violence:   . Fear of  Current or Ex-Partner: Not on file  . Emotionally Abused: Not on file  . Physically Abused: Not on file  . Sexually Abused: Not on file    Physical Exam Constitutional:      Appearance: She is normal weight.  HENT:     Head: Normocephalic.     Nose: Nose normal.     Mouth/Throat:     Mouth: Mucous membranes are moist.     Pharynx: Oropharynx is clear.  Eyes:     Pupils: Pupils are equal, round, and reactive to light.  Cardiovascular:     Rate and Rhythm: Normal rate and regular rhythm.     Pulses: Normal pulses.     Heart sounds: Normal heart sounds.  Pulmonary:     Effort: Pulmonary effort is normal.     Breath sounds: Normal breath sounds.  Abdominal:     Palpations: Abdomen is soft.  Musculoskeletal:        General: Swelling present. Normal range of motion.     Cervical back: Normal range of motion.     Right lower leg: No edema.     Left lower leg: No edema.  Skin:    General: Skin is warm and dry.     Capillary Refill: Capillary refill takes less than 2 seconds.  Neurological:     General: No focal deficit present.     Mental Status: She is alert. Mental status is at baseline.  Psychiatric:        Mood and Affect: Mood normal.    Arrived for home visit for [facility] who was alert and oriented reporting she was feeling okay but sore from the Gym. Earlee has been successful with the Prep  Program and continues to excel in it! Raeleigh was compliant with medications lately and was able to fill her pill box without assistance with written instructions last week. Anyia saw Dr. Haroldine Laws on Monday and he reports Sahily doing well and to keep her on paramedicine until around May. Today Lilyannah expressed transportation concerns and I was able to reach out to [employer] to have her enrolled in [employer]. I explained program to patient and gave her the contact information. Vitals were obtained and medications reviewed and confirmed. Pill boxes for two weeks were filled. Amberlynn and I reviewed upcoming appointments in detail and addresses, dates and times were provided. Sierra was thankful for same. Charnele agreed to home visit in two weeks. Teffany knows to call me if she needs me. Home visit complete.   Refills this week- Jardiance    Next week-  Losartan Isosorbide Allupurinol Atorvastatin      Future Appointments  Date Time Provider [place]  04/29/2020 10:15 AM MC ECHO OP 2 [facility]CHOLAB [facility]  05/04/2020  8:10 AM Shamleffer, Melanie Crazier, MD [facility] None  05/25/2020 10:00 AM Rankin, Clent Demark, MD RDE-RDE None  06/03/2020  7:05 AM [facility] DEVICE REMOTES [facility]  06/08/2020  9:10 AM Shamleffer, Melanie Crazier, MD [facility] None  09/02/2020  7:05 AM [facility] DEVICE REMOTES [facility]  12/02/2020  7:05 AM [facility] DEVICE REMOTES [facility]  03/03/2021  7:05 AM [facility] DEVICE REMOTES [facility]  06/02/2021  7:05 AM [facility] DEVICE REMOTES [facility]  09/01/2021  7:05 AM [facility] DEVICE REMOTES [facility]     ACTION: Home visit completed Next visit planned for one week

## 2020-04-29 ENCOUNTER — Ambulatory Visit (HOSPITAL_COMMUNITY)
Admission: RE | Admit: 2020-04-29 | Discharge: 2020-04-29 | Disposition: A | Discharge status: Home / Self Care | Payer: Medicare HMO | Source: Ambulatory Visit | Attending: Internal Medicine | Admitting: Internal Medicine

## 2020-04-29 ENCOUNTER — Other Ambulatory Visit: Payer: Self-pay

## 2020-04-29 DIAGNOSIS — I110 Hypertensive heart disease with heart failure: Secondary | ICD-10-CM | POA: Insufficient documentation

## 2020-04-29 DIAGNOSIS — I2510 Atherosclerotic heart disease of native coronary artery without angina pectoris: Secondary | ICD-10-CM | POA: Insufficient documentation

## 2020-04-29 DIAGNOSIS — E119 Type 2 diabetes mellitus without complications: Secondary | ICD-10-CM | POA: Insufficient documentation

## 2020-04-29 DIAGNOSIS — I5022 Chronic systolic (congestive) heart failure: Secondary | ICD-10-CM | POA: Diagnosis not present

## 2020-04-29 DIAGNOSIS — I080 Rheumatic disorders of both mitral and aortic valves: Secondary | ICD-10-CM | POA: Diagnosis not present

## 2020-04-29 LAB — ECHOCARDIOGRAM COMPLETE
Area-P 1/2: 2.83 cm2
Calc EF: 47.4 %
MV M vel: 5.65 m/s
MV Peak grad: 127.7 mmHg
P 1/2 time: 541 msec
Radius: 0.5 cm
S' Lateral: 3.8 cm
Single Plane A2C EF: 49 %
Single Plane A4C EF: 42.5 %

## 2020-04-29 NOTE — Progress Notes (Signed)
  Echocardiogram 2D Echocardiogram has been performed.  Jennette Dubin 04/29/2020, 11:14 AM

## 2020-05-02 NOTE — Progress Notes (Signed)
[facility] PREP Weekly Session   Patient Details  Name: Danielle [name] MRN: [id number] Date of Birth: May 09, 1946 Age: 74 y.o. PCP: Seward Carol, MD  Vitals:   05/02/20 1620  Weight: 225 lb (102.1 kg)     [facility] Weekly seesion - 05/02/20 1600      Weekly Session   Topic Discussed Other   portion control with demo   Minutes exercised this week --   none charted    Classes attended to date 29         Fun things since last meeting: shopping with my daughter Grateful for: walking longer, more strength with my arms Nutrition celebrations: more vegetables    Barnett Hatter 05/02/2020, 4:21 PM

## 2020-05-03 NOTE — Progress Notes (Unsigned)
Name: Danielle [name]  Age/ Sex: 74 y.o., female   MRN/ DOB: [id number], August 13, 1945     PCP: Seward Carol, MD   Reason for Endocrinology Evaluation: Type 2 Diabetes Mellitus  Initial Endocrine Consultative Visit: 05/14/2019    PATIENT IDENTIFIER: [name] is a 74 y.o. female with a past medical history of T2DM, HTN, Dyslipidemia, CAD, Hx of Breast Ca (S/P chemo and lumpectomy)  . The patient has followed with Endocrinology clinic since 05/14/2019 for consultative assistance with management of her diabetes.  DIABETIC HISTORY:  Danielle [name] was diagnosed with T2DM > 20 yrs ago. Has been on  Metformin in the past which caused diarrhea, has been on prandial insulin in the past  . Her hemoglobin A1c has ranged from 6.9% in 2019, peaking at 8.0% in 2020.  On her initial visit to our clinic her A1c was 11.2% . She was on Jardiance and Lantus 130 units daily. I reduced her lantus to 80 units, added prandial insulin.   Jardriance initiated  By cardiology 09/2019  SUBJECTIVE:   During the last visit (10/19/2019): A1c 8.8%. We continued jardiance and adjusted MDI regimen.     Today (05/04/2020): Danielle [name] is here for a follow up on diabetes due to hyperglycemia. She checks her blood sugars 1 times daily. The patient has had hypoglycemic episodes since the last clinic visit. She is symptomatic with these episodes.   She she will hold the HUmalog if BG less then 110 mg/dL    Has feet pain  for the past few weeks. The pain wakes her up at night  Has tingling and numbness at the [place]:   Lantus 75 units daily   Humalog 28 units with each meal   Jardiance 10 mg daily     Statin: Yes ACE-I/ARB:Yes    METER DOWNLOAD SUMMARY: 11/24-12/12/2019 Average Number Tests/Day = 0.6 Overall Mean FS Glucose = 158 Standard Deviation = 61  BG Ranges: Low = 51 High = 268   Hypoglycemic Events/30 Days: BG < 50 = 0 Episodes of symptomatic severe hypoglycemia =  0     DIABETIC COMPLICATIONS: Microvascular complications:   Neuropathy, CKD III, retinopathy (S/P laser)   Last eye exam: Completed 04/2019  Macrovascular complications:   CAD ( S/P MI and stent placement)  Denies: PVD, CVA    HISTORY:  Past Medical History:  Past Medical History:  Diagnosis Date  . Acute combined systolic and diastolic congestive heart failure ([place]) 02/05/2017  . 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 Acute kidney failure ([place]) 05/15/2018  . Arthritis    "all over" (05/22/2018)  . Blood dyscrasia    per pt-has small blood cells-appears as if anemic  . Breast cancer, left breast ([place]) 1985  . CHF (congestive heart failure) ([place])   . Complication of anesthesia    difficult to awaken from per pt  . Coronary artery disease   . Dyspnea   . Essential hypertension   . Heart disease   . Heart murmur   . History of gout   . Hypothyroidism   . Obesity (BMI 30-39.9) 11/11/2017  . OSA (obstructive sleep apnea) 02/18/2017    severe obstructive sleep apnea with an AHI of 75.6/h and mild central sleep apnea with a CAI of 7.7/h.  Oxygen saturations dropped as low as 82%.   He is on CPAP at 9 cm H2O.  . OSA on CPAP   . Personal  history of chemotherapy   . Personal history of radiation therapy   . Presence of permanent cardiac pacemaker   . Primary localized osteoarthritis of right knee 05/13/2018  . Refusal of blood transfusions as patient is Jehovah's Witness   . Type II diabetes mellitus ([place])    Past Surgical History:  Past Surgical History:  Procedure Laterality Date  . BREAST BIOPSY Left 1985  . BREAST LUMPECTOMY Left 1985  . BREAST LUMPECTOMY WITH NEEDLE LOCALIZATION AND AXILLARY LYMPH NODE DISSECTION  1985  . CATARACT EXTRACTION W/ INTRAOCULAR LENS  IMPLANT, BILATERAL Bilateral   . CORONARY ANGIOPLASTY WITH STENT PLACEMENT  2010, 2012,2017    2 done 2010 and 1 replaced 2012 and 2 replaced in 2017  . DILATION AND  CURETTAGE OF UTERUS    . INSERT / REPLACE / REMOVE PACEMAKER  07/19/2014  . JOINT REPLACEMENT    . REPLACEMENT TOTAL KNEE Left 09/2012  . THYROIDECTOMY, PARTIAL  1978  . TONSILLECTOMY    . TOTAL KNEE ARTHROPLASTY Right 05/13/2018   Procedure: TOTAL KNEE ARTHROPLASTY;  Surgeon: Marchia Bond, MD;  Location: [facility];  Service: Orthopedics;  Laterality: Right;  . TUBAL LIGATION      Social History:  reports that she quit smoking about 38 years ago. Her smoking use included cigarettes. She has a 22.00 pack-year smoking history. She has never used smokeless tobacco. She reports current alcohol use. She reports previous drug use. Family History:  Family History  Problem Relation Age of Onset  . Multiple myeloma Mother   . Heart disease Father   . Other Sister   . Heart disease Brother   . Diabetes Sister   . Other Sister      HOME MEDICATIONS: Allergies as of 05/04/2020      Reactions   Other Other (See Comments)   Blood products- Patient won't accept any   Sulfa Antibiotics Rash   Uloric [febuxostat] Rash      Medication List       Accurate as of May 04, 2020  8:58 AM. If you have any questions, ask your nurse or doctor.        Accu-Chek Aviva device by Other route. Use as instructed   acetaminophen 500 MG tablet Commonly known as: TYLENOL Take 500-1,000 mg by mouth every 6 (six) hours as needed (for pain or headaches).   ALIVE ONCE DAILY WOMENS 50+ PO Take 1 tablet by mouth daily.   allopurinol 100 MG tablet Commonly known as: ZYLOPRIM Take 1 tablet (100 mg total) by mouth daily.   atorvastatin 80 MG tablet Commonly known as: LIPITOR Take 1 tablet (80 mg total) by mouth at bedtime.   Biotin 10 MG Caps Take 10 mg by mouth daily.   carvedilol 25 MG tablet Commonly known as: COREG Take 1 tablet (25 mg total) by mouth 2 (two) times daily with a meal.   colchicine 0.6 MG tablet TAKE 1 TABLET BY MOUTH EVERY OTHER DAY TO PREVENT FLARES   Dexcom G6 Sensor  Misc 1 Device by Does not apply route as directed. Started by: Dorita Sciara, MD   Dexcom G6 Transmitter Misc 1 Device by Does not apply route as directed. Started by: Dorita Sciara, MD   Eliquis 5 MG Tabs tablet Generic drug: apixaban Take 1 tablet by mouth twice daily   empagliflozin 10 MG Tabs tablet Commonly known as: Jardiance Take 1 tablet (10 mg total) by mouth daily.   ferrous sulfate 325 (65 FE) MG tablet Take  1 tablet (325 mg total) by mouth daily with breakfast.   FISH OIL PO Take 1 capsule by mouth daily.   gabapentin 300 MG capsule Commonly known as: NEURONTIN Take 300 mg by mouth daily.   glucose blood test strip Use as instructed to test blood sugars 3 times daily E11.65  Freestyle lite   HumaLOG KwikPen 200 UNIT/ML KwikPen Generic drug: insulin lispro Inject 22 Units into the skin 3 (three) times daily. What changed: how much to take Changed by: Dorita Sciara, MD   insulin glargine 100 UNIT/ML injection Commonly known as: LANTUS Inject 68 Units into the skin daily.   Insulin Pen Needle 32G X 4 MM Misc 1 Device by Does not apply route 4 (four) times daily.   isosorbide mononitrate 30 MG 24 hr tablet Commonly known as: IMDUR TAKE 1 TABLET BY MOUTH ONCE DAILY   levothyroxine 75 MCG tablet Commonly known as: SYNTHROID Take 75 mcg by mouth daily before breakfast.   losartan 50 MG tablet Commonly known as: COZAAR TAKE 1 TABLET BY MOUTH AT BEDTIME   nitroGLYCERIN 0.4 MG SL tablet Commonly known as: NITROSTAT DISSOLVE ONE TABLET UNDER THE TONGUE EVERY 5 MINUTES AS NEEDED FOR CHEST PAIN.   potassium chloride 10 MEQ tablet Commonly known as: KLOR-CON Take 2 tablets (20 mEq total) by mouth daily.   spironolactone 25 MG tablet Commonly known as: ALDACTONE Take 1 tablet by mouth once daily   torsemide 20 MG tablet Commonly known as: DEMADEX Take 2 tablets by mouth once daily   Travoprost (BAK Free) 0.004 % Soln ophthalmic  solution Commonly known as: TRAVATAN Place 1 drop into both eyes at bedtime.        OBJECTIVE:   Vital Signs: BP 134/80   Pulse 64   Ht 5' 4" (1.626 m)   Wt 225 lb 6 oz (102.2 kg)   SpO2 98%   BMI 38.69 kg/m   Wt Readings from Last 3 Encounters:  05/04/20 225 lb 6 oz (102.2 kg)  05/02/20 225 lb (102.1 kg)  04/28/20 222 lb (100.7 kg)   PHYSICAL EXAM: VS: BP 134/80   Pulse 64   Ht 5' 4" (1.626 m)   Wt 225 lb 6 oz (102.2 kg)   SpO2 98%   BMI 38.69 kg/m    EXAM: General: Pt appears well and is in NAD  Heart: RRR , + systolic murmur  Lungs: CTA  Extremities:  BL LE: 1+  pretibial edema   Mental Status: Judgment, insight: intact Orientation: oriented to time, place, and person Mood and affect: no depression, anxiety, or agitation    DM Foot Exam 05/04/2020 The skin of the feet is without sores or ulcerations. The pedal pulses unable to palpate due to edema  The sensation is decreased to a screening 5.07, 10 gram monofilament bilaterally at the toes    DATA REVIEWED: Results for AMALIE, KORAN (MRN [id number]) as of 05/04/2020 08:28  Ref. Range 04/25/2020 12:28  Sodium Latest Ref Range: 135 - 145 mmol/L 141  Potassium Latest Ref Range: 3.5 - 5.1 mmol/L 4.2  Chloride Latest Ref Range: 98 - 111 mmol/L 105  CO2 Latest Ref Range: 22 - 32 mmol/L 28  Glucose Latest Ref Range: 70 - 99 mg/dL 118 (H)  Mean Plasma Glucose Latest Units: mg/dL 188.64  BUN Latest Ref Range: 8 - 23 mg/dL 29 (H)  Creatinine Latest Ref Range: 0.44 - 1.00 mg/dL 1.45 (H)  Calcium Latest Ref Range: 8.9 - 10.3 mg/dL 9.5  Anion gap Latest Ref Range: 5 - 15  8  GFR, Estimated Latest Ref Range: >60 mL/min 38 (L)      Lab Results  Component Value Date   HGBA1C 8.2 (H) 04/25/2020   HGBA1C 8.8 (A) 10/19/2019   HGBA1C 6.9 (H) 05/20/2018   Lab Results  Component Value Date   LDLCALC 65 10/20/2019   CREATININE 1.45 (H) 04/25/2020     Lab Results  Component Value Date   CHOL 150 10/20/2019    HDL 44 10/20/2019   LDLCALC 65 10/20/2019   TRIG 206 (H) 10/20/2019   CHOLHDL 3.4 10/20/2019         ASSESSMENT / PLAN / RECOMMENDATIONS:   1) Type 2 Diabetes Mellitus, with Improved Glycemic control, With Neuropathic, CKD III and Macrovascular  complications - Most recent A1c of 8.2 %. Goal A1c < 7.5 %.   - Her A1c is slowly improving  - She has not been using the humalog if her BG is less then 110 mg/dL but she has not been checking during the day , so I am not sure how she is doing Humaog  - Pt tells me if she skips a meal , her BG will drop, hence will adjust insulin as below  - I will send a prescription for CGM , she was advised to let me know she she received it and I will refer her to our CDE for education of use      MEDICATIONS: Decrease  Lantus 68 units ONCE daily  Decrease Humalog to 22  units with each meal  She is on Jardiance 10 mg daily per cardiology  EDUCATION / INSTRUCTIONS:  BG monitoring instructions: Patient is instructed to check her blood sugars 4 times a day, before each meal and bedtime.  Call [facility] if: BG persistently < 70 . I reviewed the Rule of 15 for the treatment of hypoglycemia in detail with the patient. Literature supplied.  2) Peripheral neuropathy :  - She is already on Gabapentin, I would like to avoid increasing this at this time due to LE edema and CKD.  - I have advised her to use OTC Capsaicin cream at night  Over her feet      F/U in 3 months   Signed electronically by: Mack Guise, MD  [facility]  [address] [place], [place] [address] Phone: [phone] FAX: [phone]   CC: Seward Carol, [place] [employer] [address] [address] Phone: [phone]  Fax: [phone]  Return to Endocrinology clinic as below: Future Appointments  Date Time Provider [place]  05/25/2020 10:00 AM Rankin, Clent Demark, MD RDE-RDE  None  06/03/2020  7:05 AM [facility] DEVICE REMOTES [facility]  06/08/2020  9:10 AM Everard Interrante, Melanie Crazier, MD [facility] None  09/02/2020  7:05 AM [facility] DEVICE REMOTES [facility]  12/02/2020  7:05 AM [facility] DEVICE REMOTES [facility]  03/03/2021  7:05 AM [facility] DEVICE REMOTES [facility]  06/02/2021  7:05 AM [facility] DEVICE REMOTES [facility]  09/01/2021  7:05 AM [facility] DEVICE REMOTES [facility]

## 2020-05-04 ENCOUNTER — Encounter: Payer: Self-pay | Admitting: Internal Medicine

## 2020-05-04 ENCOUNTER — Other Ambulatory Visit: Payer: Self-pay

## 2020-05-04 ENCOUNTER — Ambulatory Visit (INDEPENDENT_AMBULATORY_CARE_PROVIDER_SITE_OTHER): Payer: Medicare HMO | Admitting: Internal Medicine

## 2020-05-04 VITALS — BP 134/80 | HR 64 | Ht 64.0 in | Wt 225.4 lb

## 2020-05-04 DIAGNOSIS — G63 Polyneuropathy in diseases classified elsewhere: Secondary | ICD-10-CM

## 2020-05-04 DIAGNOSIS — E1142 Type 2 diabetes mellitus with diabetic polyneuropathy: Secondary | ICD-10-CM

## 2020-05-04 DIAGNOSIS — E113599 Type 2 diabetes mellitus with proliferative diabetic retinopathy without macular edema, unspecified eye: Secondary | ICD-10-CM | POA: Diagnosis not present

## 2020-05-04 DIAGNOSIS — E1159 Type 2 diabetes mellitus with other circulatory complications: Secondary | ICD-10-CM | POA: Diagnosis not present

## 2020-05-04 DIAGNOSIS — E1165 Type 2 diabetes mellitus with hyperglycemia: Secondary | ICD-10-CM | POA: Diagnosis not present

## 2020-05-04 DIAGNOSIS — Z794 Long term (current) use of insulin: Secondary | ICD-10-CM

## 2020-05-04 MED ORDER — DEXCOM G6 TRANSMITTER MISC: 1.0000 | 3 refills | Status: DC

## 2020-05-04 MED ORDER — HUMALOG KWIKPEN 200 UNIT/ML ~~LOC~~ SOPN
22.0000 [IU] | PEN_INJECTOR | Freq: Three times a day (TID) | SUBCUTANEOUS | 6 refills | Status: DC
Start: 2020-05-04 — End: 2020-07-27

## 2020-05-04 MED ORDER — DEXCOM G6 SENSOR MISC: 1.0000 | 11 refills | Status: DC

## 2020-05-04 NOTE — Patient Instructions (Addendum)
-   Decrease Lantus 68 units ONCE daily  - Humalog 22 units with each meal  - Continue Jardiance 10 mg daily     - Try rubbing Capsaicin cream over the feet at night to see if it helps with the pain, please make sure you wash your hands thoroughly before you touch anything else      HOW TO TREAT LOW BLOOD SUGARS (Blood sugar LESS THAN 70 MG/DL)  Please follow the RULE OF 15 for the treatment of hypoglycemia treatment (when your (blood sugars are less than 70 mg/dL)    STEP 1: Take 15 grams of carbohydrates when your blood sugar is low, which includes:   3-4 GLUCOSE TABS  OR  3-4 OZ OF JUICE OR REGULAR SODA OR  ONE TUBE OF GLUCOSE GEL     STEP 2: RECHECK blood sugar in 15 MINUTES STEP 3: If your blood sugar is still low at the 15 minute recheck --> then, go back to STEP 1 and treat AGAIN with another 15 grams of carbohydrates.

## 2020-05-09 NOTE — Progress Notes (Signed)
[facility] PREP Weekly Session   Patient Details  Name: Danielle [name] MRN: [id number] Date of Birth: [birth year] Age: 74 y.o. PCP: Seward Carol, MD  Vitals:   05/09/20 1634  Weight: 224 lb (101.6 kg)     [facility] Weekly seesion - 05/09/20 1600      Weekly Session   Topic Discussed Finding support    Minutes exercised this week --   none recorded   Classes attended to date 6          Grateful for: less pain Frustrated with swelling. Thinks the diuretics are working. Did not sleep well last pm. Not getting too much salt.  Will f/u with phone call to MD for check up Fingers are tender but states not gout    Barnett Hatter 05/09/2020, 4:35 PM

## 2020-05-16 ENCOUNTER — Other Ambulatory Visit (HOSPITAL_COMMUNITY): Payer: Self-pay | Admitting: Internal Medicine

## 2020-05-16 ENCOUNTER — Other Ambulatory Visit (HOSPITAL_COMMUNITY): Payer: Self-pay

## 2020-05-16 NOTE — Progress Notes (Signed)
Paramedicine Encounter    Patient ID: Danielle [name], female    DOB: 14-Jan-1946, 74 y.o.   MRN: [id number]   Patient Care Team: Seward Carol, MD as PCP - General (Internal Medicine) Constance Haw, MD as PCP - Electrophysiology (Cardiology) Sueanne Margarita, MD as PCP - Sleep Medicine (Cardiology) Jorge Ny, LCSW as Social Worker (Licensed Clinical Social Worker)  Patient Active Problem List   Diagnosis Date Noted  . Polyneuropathy associated with underlying disease ([place]) 05/04/2020  . Severe nonproliferative diabetic retinopathy of left eye, with macular edema, associated with type 2 diabetes mellitus ([place]) 11/05/2019  . Severe nonproliferative diabetic retinopathy of right eye, with macular edema, associated with type 2 diabetes mellitus ([place]) 11/05/2019  . Retinal hemorrhage of right eye 11/05/2019  . Retinal hemorrhage of left eye 11/05/2019  . Type 2 diabetes mellitus with hyperglycemia, with long-term current use of insulin ([place]) 05/14/2019  . Diabetes mellitus ([place]) 05/14/2019  . 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, with long-term current use of insulin ([place]) 05/14/2019  . Type 2 diabetes mellitus with diabetic polyneuropathy, with long-term current use of insulin ([place]) 05/14/2019  . Encephalopathy acute 05/20/2018  . 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 Acute kidney failure ([place]) 05/15/2018  . Primary localized osteoarthritis of right knee 05/13/2018  . (HFpEF) heart failure with preserved ejection fraction ([place]) 12/20/2017  . Obesity (BMI 30-39.9) 11/11/2017  . OSA (obstructive sleep apnea) 02/18/2017  . Essential hypertension   . Diabetes mellitus without complication ([place])   . Cancer ([place])   . Thyroid disease     Current Outpatient Medications:  .  acetaminophen (TYLENOL) 500 MG tablet, Take 500-1,000 mg by mouth every 6 (six) hours as needed (for pain or headaches)., Disp: , Rfl:  .  allopurinol (ZYLOPRIM)  100 MG tablet, Take 1 tablet (100 mg total) by mouth daily., Disp: , Rfl:  .  atorvastatin (LIPITOR) 80 MG tablet, Take 1 tablet (80 mg total) by mouth at bedtime., Disp: 30 tablet, Rfl: 1 .  Biotin 10 MG CAPS, Take 10 mg by mouth daily. , Disp: , Rfl:  .  Blood Glucose Monitoring Suppl (ACCU-CHEK AVIVA) device, by Other route. Use as instructed, Disp: , Rfl:  .  carvedilol (COREG) 25 MG tablet, Take 1 tablet (25 mg total) by mouth 2 (two) times daily with a meal., Disp: 30 tablet, Rfl: 1 .  colchicine 0.6 MG tablet, TAKE 1 TABLET BY MOUTH EVERY OTHER DAY TO PREVENT FLARES, Disp: , Rfl:  .  Continuous Blood Gluc Sensor (DEXCOM G6 SENSOR) MISC, 1 Device by Does not apply route as directed., Disp: 3 each, Rfl: 11 .  Continuous Blood Gluc Transmit (DEXCOM G6 TRANSMITTER) MISC, 1 Device by Does not apply route as directed., Disp: 1 each, Rfl: 3 .  ELIQUIS 5 MG TABS tablet, Take 1 tablet by mouth twice daily, Disp: 60 tablet, Rfl: 0 .  empagliflozin (JARDIANCE) 10 MG TABS tablet, Take 1 tablet (10 mg total) by mouth daily., Disp: 30 tablet, Rfl: 1 .  ferrous sulfate 325 (65 FE) MG tablet, Take 1 tablet (325 mg total) by mouth daily with breakfast., Disp: 90 tablet, Rfl: 3 .  gabapentin (NEURONTIN) 300 MG capsule, Take 300 mg by mouth daily. , Disp: , Rfl:  .  glucose blood test strip, Use as instructed to test blood sugars 3 times daily E11.65  Freestyle lite, Disp: 400 each, Rfl: 3 .  insulin glargine (LANTUS) 100 UNIT/ML  injection, Inject 68 Units into the skin daily., Disp: , Rfl:  .  insulin lispro (HUMALOG KWIKPEN) 200 UNIT/ML KwikPen, Inject 22 Units into the skin 3 (three) times daily., Disp: 30 mL, Rfl: 6 .  Insulin Pen Needle 32G X 4 MM MISC, 1 Device by Does not apply route 4 (four) times daily., Disp: 400 each, Rfl: 3 .  isosorbide mononitrate (IMDUR) 30 MG 24 hr tablet, TAKE 1 TABLET BY MOUTH ONCE DAILY, Disp: 30 tablet, Rfl: 1 .  levothyroxine (SYNTHROID, LEVOTHROID) 75 MCG tablet, Take 75  mcg by mouth daily before breakfast., Disp: , Rfl:  .  losartan (COZAAR) 50 MG tablet, TAKE 1 TABLET BY MOUTH AT BEDTIME, Disp: 30 tablet, Rfl: 1 .  Multiple Vitamins-Minerals (ALIVE ONCE DAILY WOMENS 50+ PO), Take 1 tablet by mouth daily., Disp: , Rfl:  .  nitroGLYCERIN (NITROSTAT) 0.4 MG SL tablet, DISSOLVE ONE TABLET UNDER THE TONGUE EVERY 5 MINUTES AS NEEDED FOR CHEST PAIN., Disp: 25 tablet, Rfl: 3 .  Omega-3 Fatty Acids (FISH OIL PO), Take 1 capsule by mouth daily., Disp: , Rfl:  .  potassium chloride (KLOR-CON) 10 MEQ tablet, Take 2 tablets (20 mEq total) by mouth daily., Disp: 60 tablet, Rfl: 1 .  spironolactone (ALDACTONE) 25 MG tablet, Take 1 tablet by mouth once daily, Disp: 30 tablet, Rfl: 0 .  torsemide (DEMADEX) 20 MG tablet, Take 2 tablets by mouth once daily, Disp: 60 tablet, Rfl: 0 .  Travoprost, BAK Free, (TRAVATAN) 0.004 % SOLN ophthalmic solution, Place 1 drop into both eyes at bedtime. , Disp: , Rfl:  Allergies  Allergen Reactions  . Other Other (See Comments)    Blood products- Patient won't accept any  . Sulfa Antibiotics Rash  . Uloric [Febuxostat] Rash     Social History   Socioeconomic History  . Marital status: Widowed    Spouse name: Not on file  . Number of children: 3  . Years of education: Not on file  . Highest education level: Some college, no degree  Occupational History  . Occupation: retired  Tobacco Use  . Smoking status: Former Smoker    Packs/day: 1.00    Years: 22.00    Pack years: 22.00    Types: Cigarettes    Quit date: 1983    Years since quitting: 38.9  . Smokeless tobacco: Never Used  Vaping Use  . Vaping Use: Never used  Substance and Sexual Activity  . Alcohol use: Yes    Comment: 05/22/2018 "glass of wine couple times/wk"  . Drug use: Not Currently  . Sexual activity: Not Currently    Birth control/protection: None, Post-menopausal  Other Topics Concern  . Not on file  Social History Narrative  . Not on file   Social  Determinants of Health   Financial Resource Strain: Not on file  Food Insecurity: Not on file  Transportation Needs: Not on file  Physical Activity: Not on file  Stress: Not on file  Social Connections: Not on file  Intimate Partner Violence: Not on file    Physical Exam Vitals reviewed.  Constitutional:      Appearance: She is normal weight.  HENT:     Head: Normocephalic.     Nose: Nose normal.     Mouth/Throat:     Mouth: Mucous membranes are moist.     Pharynx: Oropharynx is clear.  Eyes:     Pupils: Pupils are equal, round, and reactive to light.  Cardiovascular:     Rate and  Rhythm: Normal rate and regular rhythm.     Pulses: Normal pulses.     Heart sounds: Normal heart sounds.  Pulmonary:     Effort: Pulmonary effort is normal.     Breath sounds: Normal breath sounds.  Abdominal:     General: Abdomen is flat.     Palpations: Abdomen is soft.  Musculoskeletal:        General: Normal range of motion.     Cervical back: Normal range of motion.     Right lower leg: No edema.     Left lower leg: No edema.  Skin:    General: Skin is warm and dry.     Capillary Refill: Capillary refill takes less than 2 seconds.  Neurological:     General: No focal deficit present.     Mental Status: She is alert. Mental status is at baseline.  Psychiatric:        Mood and Affect: Mood normal.      Arrived for home visit for Danielle [name] who was alert and oriented stating she just got home from her exercise class with the [facility] reports having great success with this program and is enjoying it. Danielle [name] has lost 5 lbs since our last visit and says she feels great. Vitals were obtained and are as noted. Medications were reviewed and confirmed. Pill boxes were filled accordingly with some medications running short which were wrote down with instructions on where they go once picked up from pharmacy. Patient agrees and understands. Danielle [name] denied any shortness of breath, chest  pain or dizziness. No edema, swelling or JVD noted. Lung sounds clear. Danielle [name] reports she has been using her CPAP machine with no complaints. Danielle [name] agreed to home visit in two weeks. Home visit complete.    Refills:  Atorvastatin Gabapentin Torsemide Sprionolactone Losartan Allupurinol Eliquis Jardiance     Future Appointments  Date Time Provider [place]  05/25/2020 10:00 AM Rankin, Clent Demark, MD [facility] None  06/03/2020  7:05 AM [facility] DEVICE REMOTES [facility]  08/05/2020 10:50 AM Shamleffer, Melanie Crazier, MD [facility] None  09/02/2020  7:05 AM [facility] DEVICE REMOTES [facility]  12/02/2020  7:05 AM [facility] DEVICE REMOTES [facility]  03/03/2021  7:05 AM [facility] DEVICE REMOTES [facility]  06/02/2021  7:05 AM [facility] DEVICE REMOTES [facility]  09/01/2021  7:05 AM [facility] DEVICE REMOTES [facility]     ACTION: Home visit completed Next visit planned for two weeks

## 2020-05-16 NOTE — Progress Notes (Signed)
[facility] PREP Weekly Session   Patient Details  Name: Brandolyn Shortridge MRN: [id number] Date of Birth: Jun 25, 1945 Age: 74 y.o. PCP: Seward Carol, MD  Vitals:   05/16/20 1622  Weight: 217 lb (98.4 kg)     [facility] Weekly seesion - 05/16/20 1600      Weekly Session   Topic Discussed Calorie breakdown    Minutes exercised this week --   not calculted > 60   Classes attended to date 27          Fun things since last meeting: light dancing in place Grateful for losing weight Barriers/struggles: strengths   Barnett Hatter 05/16/2020, 4:24 PM

## 2020-05-18 ENCOUNTER — Other Ambulatory Visit (HOSPITAL_COMMUNITY): Payer: Self-pay | Admitting: *Deleted

## 2020-05-18 MED ORDER — APIXABAN 5 MG PO TABS
5.0000 mg | ORAL_TABLET | Freq: Two times a day (BID) | ORAL | 3 refills | Status: DC
Start: 2020-05-18 — End: 2020-06-08

## 2020-05-18 MED ORDER — SPIRONOLACTONE 25 MG PO TABS
25.0000 mg | ORAL_TABLET | Freq: Every day | ORAL | 3 refills | Status: DC
Start: 2020-05-18 — End: 2020-10-19

## 2020-05-18 MED ORDER — TORSEMIDE 20 MG PO TABS
40.0000 mg | ORAL_TABLET | Freq: Every day | ORAL | 3 refills | Status: DC
Start: 2020-05-18 — End: 2020-10-13

## 2020-05-24 NOTE — Progress Notes (Signed)
[facility] PREP Weekly Session   Patient Details  Name: Danielle [name] MRN: [id number] Date of Birth: 06/18/1945 Age: 74 y.o. PCP: Renford Dills, MD  Vitals:   05/23/20 1430  Weight: 216 lb (98 kg)     [facility] Weekly seesion - 05/24/20 1100      Weekly Session   Topic Discussed Hitting roadblocks   Class on 12/27, renewing goals   Minutes exercised this week --   not charted   Classes attended to date 20          PREP class 05/23/20 late entry  Grateful for: losing weight, less pain in knees and back  Has gouty pain in hands. ? Related to increased spinach intake. Talked about removing from diet a few days to see if improves.    [name] 05/24/2020, 11:54 AM

## 2020-05-25 ENCOUNTER — Other Ambulatory Visit: Payer: Self-pay

## 2020-05-25 ENCOUNTER — Ambulatory Visit (INDEPENDENT_AMBULATORY_CARE_PROVIDER_SITE_OTHER): Payer: Medicare HMO | Admitting: Ophthalmology

## 2020-05-25 ENCOUNTER — Encounter (INDEPENDENT_AMBULATORY_CARE_PROVIDER_SITE_OTHER): Payer: Self-pay | Admitting: Ophthalmology

## 2020-05-25 DIAGNOSIS — E113412 Type 2 diabetes mellitus with severe nonproliferative diabetic retinopathy with macular edema, left eye: Secondary | ICD-10-CM | POA: Diagnosis not present

## 2020-05-25 DIAGNOSIS — G4733 Obstructive sleep apnea (adult) (pediatric): Secondary | ICD-10-CM | POA: Diagnosis not present

## 2020-05-25 MED ORDER — BEVACIZUMAB 2.5 MG/0.1ML IZ SOSY
2.5000 mg | PREFILLED_SYRINGE | INTRAVITREAL | Status: AC | PRN
  Administered 2020-05-25: 2.5 mg via INTRAVITREAL

## 2020-05-25 NOTE — Assessment & Plan Note (Signed)
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  We will repeat Avastin OS today, at 6-week interval and consider focal laser photocoagulation perhaps more aggressively treating the micro aneurysmal changes in a nongrid fashion next

## 2020-05-25 NOTE — Assessment & Plan Note (Signed)
Patient complains of dry eyes in the morning post use of CPAP machinery, I suggested to her she check her machine for feedback and/or consider that she may have leakage around her mask which could decrease the effective use of CPAP that she requires

## 2020-05-25 NOTE — Progress Notes (Signed)
05/25/2020     CHIEF COMPLAINT Patient presents for Retina Follow Up (6 WK FU OS, POSS AVASTIN OS////Pt reports stable vision OS, no new f/f OS, pain/ache OS that comes and goes. ////Last A1C: 8.0  03/2020///Last BS: 159 taken this AM )   HISTORY OF PRESENT ILLNESS: Danielle [name] is a 74 y.o. female who presents to the clinic today for:   HPI    Retina Follow Up    Patient presents with  Diabetic Retinopathy.  In left eye.  This started 6 weeks ago.  Duration of 6 weeks.  Since onset it is stable. Additional comments: 6 WK FU OS, POSS AVASTIN OS    Pt reports stable vision OS, no new f/f OS, pain/ache OS that comes and goes.     Last A1C: 8.0  03/2020   Last BS: 159 taken this AM        Last edited by Nichola Sizer D on 05/25/2020 10:14 AM. (History)      Referring physician: Seward Carol, MD [address] [place],  [place] [address]  HISTORICAL INFORMATION:   Selected notes from the MEDICAL RECORD NUMBER    Lab Results  Component Value Date   HGBA1C 8.2 (H) 04/25/2020     CURRENT MEDICATIONS: Current Outpatient Medications (Ophthalmic Drugs)  Medication Sig  . Travoprost, BAK Free, (TRAVATAN) 0.004 % SOLN ophthalmic solution Place 1 drop into both eyes at bedtime.    No current facility-administered medications for this visit. (Ophthalmic Drugs)   Current Outpatient Medications (Other)  Medication Sig  . acetaminophen (TYLENOL) 500 MG tablet Take 500-1,000 mg by mouth every 6 (six) hours as needed (for pain or headaches).  Marland Kitchen allopurinol (ZYLOPRIM) 100 MG tablet Take 1 tablet (100 mg total) by mouth daily.  Marland Kitchen apixaban (ELIQUIS) 5 MG TABS tablet Take 1 tablet (5 mg total) by mouth 2 (two) times daily.  Marland Kitchen atorvastatin (LIPITOR) 80 MG tablet Take 1 tablet (80 mg total) by mouth at bedtime.  . Biotin 10 MG CAPS Take 10 mg by mouth daily.   . Blood Glucose Monitoring Suppl (ACCU-CHEK AVIVA) device by Other route. Use as instructed  .  carvedilol (COREG) 25 MG tablet Take 1 tablet (25 mg total) by mouth 2 (two) times daily with a meal.  . colchicine 0.6 MG tablet TAKE 1 TABLET BY MOUTH EVERY OTHER DAY TO PREVENT FLARES  . Continuous Blood Gluc Sensor (DEXCOM G6 SENSOR) MISC 1 Device by Does not apply route as directed.  . Continuous Blood Gluc Transmit (DEXCOM G6 TRANSMITTER) MISC 1 Device by Does not apply route as directed.  . empagliflozin (JARDIANCE) 10 MG TABS tablet Take 1 tablet (10 mg total) by mouth daily.  . ferrous sulfate 325 (65 FE) MG tablet Take 1 tablet (325 mg total) by mouth daily with breakfast.  . gabapentin (NEURONTIN) 300 MG capsule Take 300 mg by mouth daily.   Marland Kitchen glucose blood test strip Use as instructed to test blood sugars 3 times daily E11.65  Freestyle lite  . insulin glargine (LANTUS) 100 UNIT/ML injection Inject 68 Units into the skin daily.  . insulin lispro (HUMALOG KWIKPEN) 200 UNIT/ML KwikPen Inject 22 Units into the skin 3 (three) times daily.  . Insulin Pen Needle 32G X 4 MM MISC 1 Device by Does not apply route 4 (four) times daily.  . isosorbide mononitrate (IMDUR) 30 MG 24 hr tablet TAKE 1 TABLET BY MOUTH ONCE DAILY  . levothyroxine (SYNTHROID, LEVOTHROID) 75 MCG  tablet Take 75 mcg by mouth daily before breakfast.  . losartan (COZAAR) 50 MG tablet TAKE 1 TABLET BY MOUTH AT BEDTIME  . Multiple Vitamins-Minerals (ALIVE ONCE DAILY WOMENS 50+ PO) Take 1 tablet by mouth daily.  . nitroGLYCERIN (NITROSTAT) 0.4 MG SL tablet DISSOLVE ONE TABLET UNDER THE TONGUE EVERY 5 MINUTES AS NEEDED FOR CHEST PAIN. (Patient not taking: Reported on 05/16/2020)  . Omega-3 Fatty Acids (FISH OIL PO) Take 1 capsule by mouth daily.  . potassium chloride (KLOR-CON) 10 MEQ tablet Take 2 tablets (20 mEq total) by mouth daily.  Marland Kitchen spironolactone (ALDACTONE) 25 MG tablet Take 1 tablet (25 mg total) by mouth daily.  Marland Kitchen torsemide (DEMADEX) 20 MG tablet Take 2 tablets (40 mg total) by mouth daily.   No current  facility-administered medications for this visit. (Other)      REVIEW OF SYSTEMS:    ALLERGIES Allergies  Allergen Reactions  . Other Other (See Comments)    Blood products- Patient won't accept any  . Sulfa Antibiotics Rash  . Uloric [Febuxostat] Rash    PAST MEDICAL HISTORY Past Medical History:  Diagnosis Date  . Acute combined systolic and diastolic congestive heart failure ([place]) 02/05/2017  . 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 Acute kidney failure ([place]) 05/15/2018  . Arthritis    "all over" (05/22/2018)  . Blood dyscrasia    per pt-has small blood cells-appears as if anemic  . Breast cancer, left breast ([place]) 1985  . CHF (congestive heart failure) ([place])   . Complication of anesthesia    difficult to awaken from per pt  . Coronary artery disease   . Dyspnea   . Essential hypertension   . Heart disease   . Heart murmur   . History of gout   . Hypothyroidism   . Obesity (BMI 30-39.9) 11/11/2017  . OSA (obstructive sleep apnea) 02/18/2017    severe obstructive sleep apnea with an AHI of 75.6/h and mild central sleep apnea with a CAI of 7.7/h.  Oxygen saturations dropped as low as 82%.   He is on CPAP at 9 cm H2O.  . OSA on CPAP   . Personal history of chemotherapy   . Personal history of radiation therapy   . Presence of permanent cardiac pacemaker   . Primary localized osteoarthritis of right knee 05/13/2018  . Refusal of blood transfusions as patient is Jehovah's Witness   . Type II diabetes mellitus ([place])    Past Surgical History:  Procedure Laterality Date  . BREAST BIOPSY Left 1985  . BREAST LUMPECTOMY Left 1985  . BREAST LUMPECTOMY WITH NEEDLE LOCALIZATION AND AXILLARY LYMPH NODE DISSECTION  1985  . CATARACT EXTRACTION W/ INTRAOCULAR LENS  IMPLANT, BILATERAL Bilateral   . CORONARY ANGIOPLASTY WITH STENT PLACEMENT  2010, 2012,2017    2 done 2010 and 1 replaced 2012 and 2 replaced in 2017  . DILATION AND CURETTAGE OF  UTERUS    . INSERT / REPLACE / REMOVE PACEMAKER  07/19/2014  . JOINT REPLACEMENT    . REPLACEMENT TOTAL KNEE Left 09/2012  . THYROIDECTOMY, PARTIAL  1978  . TONSILLECTOMY    . TOTAL KNEE ARTHROPLASTY Right 05/13/2018   Procedure: TOTAL KNEE ARTHROPLASTY;  Surgeon: Marchia Bond, MD;  Location: [facility];  Service: Orthopedics;  Laterality: Right;  . TUBAL LIGATION      FAMILY HISTORY Family History  Problem Relation Age of Onset  . Multiple myeloma Mother   . Heart disease Father   .  Other Sister   . Heart disease Brother   . Diabetes Sister   . Other Sister     SOCIAL HISTORY Social History   Tobacco Use  . Smoking status: Former Smoker    Packs/day: 1.00    Years: 22.00    Pack years: 22.00    Types: Cigarettes    Quit date: 1983    Years since quitting: 39.0  . Smokeless tobacco: Never Used  Vaping Use  . Vaping Use: Never used  Substance Use Topics  . Alcohol use: Yes    Comment: 05/22/2018 "glass of wine couple times/wk"  . Drug use: Not Currently         OPHTHALMIC EXAM:  Base Eye Exam    Visual Acuity (ETDRS)      Right Left   Dist cc 20/30 -2 20/40 -2   Dist ph cc NI NI   Correction: Glasses       Tonometry (Tonopen, 10:22 AM)      Right Left   Pressure 17 18       Pupils      Pupils Dark Light Shape React APD   Right PERRL 3 2 Round Brisk None   Left PERRL 3 2 Round Brisk None       Visual Fields (Counting fingers)      Left Right     Full   Restrictions Partial outer superior temporal deficiency        Extraocular Movement      Right Left    Full Full       Neuro/Psych    Oriented x3: Yes   Mood/Affect: Normal       Dilation    Left eye: 1.0% Mydriacyl, 2.5% Phenylephrine @ 10:22 AM        Slit Lamp and Fundus Exam    External Exam      Right Left   External Normal Normal       Slit Lamp Exam      Right Left   Lids/Lashes Normal Normal   Conjunctiva/Sclera White and quiet White and quiet   Cornea Clear Clear    Anterior Chamber Deep and quiet Deep and quiet   Iris Round and reactive Round and reactive   Lens Centered posterior chamber intraocular lens, Open posterior capsule Centered posterior chamber intraocular lens, Open posterior capsule   Anterior Vitreous Normal Normal       Fundus Exam      Right Left   Posterior Vitreous  Normal   Disc  Normal   C/D Ratio  0.75   Macula  Microaneurysms, macular thickening, Severe clinically significant macular edema centered threatening but not center involved OS., Exudates,    Vessels  NPDR- Moderate   Periphery  Normal          IMAGING AND PROCEDURES  Imaging and Procedures for 05/25/20  OCT, Retina - OU - Both Eyes       Right Eye Quality was good. Scan locations included subfoveal. Central Foveal Thickness: 282.   Left Eye Quality was good. Scan locations included subfoveal. Central Foveal Thickness: 279. Findings include cystoid macular edema.   Notes CSME, with protected fovea, not center involved, temporally OS.  We will repeat Avastin OS today, at 6-week interval and consider focal laser photocoagulation perhaps more aggressively treating the micro aneurysmal changes in a nongrid fashion next       Intravitreal Injection, Pharmacologic Agent - OS - Left Eye  Time Out 05/25/2020. 11:31 AM. Confirmed correct patient, procedure, site, and patient consented.   Anesthesia Topical anesthesia was used. Anesthetic medications included Akten 3.5%.   Procedure Preparation included Tobramycin 0.3%, 10% betadine to eyelids, 5% betadine to ocular surface. A 30 gauge needle was used.   Injection:  2.5 mg Bevacizumab (AVASTIN) 2.37m/0.1mL SOSY   NDC: 745364-680-32 Lot:: [id number]  Route: Intravitreal, Site: Left Eye  Post-op Post injection exam found visual acuity of at least counting fingers. The patient tolerated the procedure well. There were no complications. The patient received written and verbal post procedure care  education. Post injection medications were not given.                 ASSESSMENT/PLAN:  Severe nonproliferative diabetic retinopathy of left eye, with macular edema, associated with type 2 diabetes mellitus (HCC) CSME, with protected fovea, not center involved, temporally OS.  We will repeat Avastin OS today, at 6-week interval and consider focal laser photocoagulation perhaps more aggressively treating the micro aneurysmal changes in a nongrid fashion next  OSA (obstructive sleep apnea) Patient complains of dry eyes in the morning post use of CPAP machinery, I suggested to her she check her machine for feedback and/or consider that she may have leakage around her mask which could decrease the effective use of CPAP that she requires      ICD-10-CM   1. Severe nonproliferative diabetic retinopathy of left eye, with macular edema, associated with type 2 diabetes mellitus (HCC)  EO03.7048OCT, Retina - OU - Both Eyes    Intravitreal Injection, Pharmacologic Agent - OS - Left Eye    bevacizumab (AVASTIN) SOSY 2.5 mg  2. OSA (obstructive sleep apnea)  G47.33     1.  Repeat injection intravitreal Avastin OS today for continued active CSME temporal to the fovea despite focal and ongoing Avastin.  Effective at preventing center involvement however.  2.  We will consider more aggressive treatment of nongrid, focal to micro aneurysmal changes next visit 6 weeks, dilated OS  3.  Patient instructed to confirm excellent fit of her CPAP mask to maximize oxygenation nightly  Ophthalmic Meds Ordered this visit:  Meds ordered this encounter  Medications  . bevacizumab (AVASTIN) SOSY 2.5 mg       Return in about 6 weeks (around 07/06/2020) for dilate, OS, FOCAL.  There are no Patient Instructions on file for this visit.   Explained the diagnoses, plan, and follow up with the patient and they expressed understanding.  Patient expressed understanding of the importance of proper follow up care.    GClent DemarkRankin M.D. Diseases & Surgery of the Retina and Vitreous [facility]/29[facility]     Abbreviations: M myopia (nearsighted); A astigmatism; H hyperopia (farsighted); P presbyopia; Mrx spectacle prescription;  CTL contact lenses; OD right eye; OS left eye; OU both eyes  XT exotropia; ET esotropia; PEK punctate epithelial keratitis; PEE punctate epithelial erosions; DES dry eye syndrome; MGD meibomian gland dysfunction; ATs artificial tears; PFAT's preservative free artificial tears; NOgdennuclear sclerotic cataract; PSC posterior subcapsular cataract; ERM epi-retinal membrane; PVD posterior vitreous detachment; RD retinal detachment; DM diabetes mellitus; DR diabetic retinopathy; NPDR non-proliferative diabetic retinopathy; PDR proliferative diabetic retinopathy; CSME clinically significant macular edema; DME diabetic macular edema; dbh dot blot hemorrhages; CWS cotton wool spot; POAG primary open angle glaucoma; C/D cup-to-disc ratio; HVF humphrey visual field; GVF goldmann visual field; OCT optical coherence tomography; IOP intraocular pressure; BRVO Branch retinal vein occlusion; CRVO  central retinal vein occlusion; CRAO central retinal artery occlusion; BRAO branch retinal artery occlusion; RT retinal tear; SB scleral buckle; PPV pars plana vitrectomy; VH Vitreous hemorrhage; PRP panretinal laser photocoagulation; IVK intravitreal kenalog; VMT vitreomacular traction; MH Macular hole;  NVD neovascularization of the disc; NVE neovascularization elsewhere; AREDS age related eye disease study; ARMD age related macular degeneration; POAG primary open angle glaucoma; EBMD epithelial/anterior basement membrane dystrophy; ACIOL anterior chamber intraocular lens; IOL intraocular lens; PCIOL posterior chamber intraocular lens; Phaco/IOL phacoemulsification with intraocular lens placement; [place] photorefractive keratectomy; LASIK laser assisted in situ keratomileusis; HTN hypertension; DM  diabetes mellitus; COPD chronic obstructive pulmonary disease

## 2020-06-02 ENCOUNTER — Other Ambulatory Visit (HOSPITAL_COMMUNITY): Payer: Self-pay

## 2020-06-02 ENCOUNTER — Encounter: Payer: Self-pay | Admitting: Internal Medicine

## 2020-06-02 NOTE — Progress Notes (Signed)
Paramedicine Encounter    Patient ID: Danielle [name], female    DOB: 1945/09/24, 75 y.o.   MRN: [id number]   Patient Care Team: Renford Dills, MD as PCP - General (Internal Medicine) Regan Lemming, MD as PCP - Electrophysiology (Cardiology) Quintella Reichert, MD as PCP - Sleep Medicine (Cardiology) Burna Sis, LCSW as Social Worker (Licensed Clinical Social Worker)  Patient Active Problem List   Diagnosis Date Noted  . Polyneuropathy associated with underlying disease (HCC) 05/04/2020  . Severe nonproliferative diabetic retinopathy of left eye, with macular edema, associated with type 2 diabetes mellitus (HCC) 11/05/2019  . Severe nonproliferative diabetic retinopathy of right eye, with macular edema, associated with type 2 diabetes mellitus (HCC) 11/05/2019  . Retinal hemorrhage of right eye 11/05/2019  . Retinal hemorrhage of left eye 11/05/2019  . Type 2 diabetes mellitus with hyperglycemia, with long-term current use of insulin (HCC) 05/14/2019  . Diabetes mellitus (HCC) 05/14/2019  . 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, with long-term current use of insulin (HCC) 05/14/2019  . Type 2 diabetes mellitus with diabetic polyneuropathy, with long-term current use of insulin (HCC) 05/14/2019  . Encephalopathy acute 05/20/2018  . 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 Acute kidney failure (HCC) 05/15/2018  . Primary localized osteoarthritis of right knee 05/13/2018  . (HFpEF) heart failure with preserved ejection fraction (HCC) 12/20/2017  . Obesity (BMI 30-39.9) 11/11/2017  . OSA (obstructive sleep apnea) 02/18/2017  . Essential hypertension   . Diabetes mellitus without complication (HCC)   . Cancer (HCC)   . Thyroid disease     Current Outpatient Medications:  .  acetaminophen (TYLENOL) 500 MG tablet, Take 500-1,000 mg by mouth every 6 (six) hours as needed (for pain or headaches)., Disp: , Rfl:  .  allopurinol (ZYLOPRIM)  100 MG tablet, Take 1 tablet (100 mg total) by mouth daily., Disp: , Rfl:  .  apixaban (ELIQUIS) 5 MG TABS tablet, Take 1 tablet (5 mg total) by mouth 2 (two) times daily., Disp: 60 tablet, Rfl: 3 .  atorvastatin (LIPITOR) 80 MG tablet, Take 1 tablet (80 mg total) by mouth at bedtime., Disp: 30 tablet, Rfl: 1 .  Biotin 10 MG CAPS, Take 10 mg by mouth daily. , Disp: , Rfl:  .  Blood Glucose Monitoring Suppl (ACCU-CHEK AVIVA) device, by Other route. Use as instructed, Disp: , Rfl:  .  carvedilol (COREG) 25 MG tablet, Take 1 tablet (25 mg total) by mouth 2 (two) times daily with a meal., Disp: 30 tablet, Rfl: 1 .  colchicine 0.6 MG tablet, TAKE 1 TABLET BY MOUTH EVERY OTHER DAY TO PREVENT FLARES, Disp: , Rfl:  .  Continuous Blood Gluc Sensor (DEXCOM G6 SENSOR) MISC, 1 Device by Does not apply route as directed., Disp: 3 each, Rfl: 11 .  Continuous Blood Gluc Transmit (DEXCOM G6 TRANSMITTER) MISC, 1 Device by Does not apply route as directed., Disp: 1 each, Rfl: 3 .  empagliflozin (JARDIANCE) 10 MG TABS tablet, Take 1 tablet (10 mg total) by mouth daily., Disp: 30 tablet, Rfl: 1 .  ferrous sulfate 325 (65 FE) MG tablet, Take 1 tablet (325 mg total) by mouth daily with breakfast., Disp: 90 tablet, Rfl: 3 .  gabapentin (NEURONTIN) 300 MG capsule, Take 300 mg by mouth daily. , Disp: , Rfl:  .  glucose blood test strip, Use as instructed to test blood sugars 3 times daily E11.65  Freestyle lite, Disp: 400 each, Rfl: 3 .  insulin glargine (LANTUS) 100 UNIT/ML injection, Inject 68 Units into the skin daily., Disp: , Rfl:  .  insulin lispro (HUMALOG KWIKPEN) 200 UNIT/ML KwikPen, Inject 22 Units into the skin 3 (three) times daily., Disp: 30 mL, Rfl: 6 .  Insulin Pen Needle 32G X 4 MM MISC, 1 Device by Does not apply route 4 (four) times daily., Disp: 400 each, Rfl: 3 .  isosorbide mononitrate (IMDUR) 30 MG 24 hr tablet, TAKE 1 TABLET BY MOUTH ONCE DAILY, Disp: 30 tablet, Rfl: 1 .  levothyroxine (SYNTHROID,  LEVOTHROID) 75 MCG tablet, Take 75 mcg by mouth daily before breakfast., Disp: , Rfl:  .  losartan (COZAAR) 50 MG tablet, TAKE 1 TABLET BY MOUTH AT BEDTIME, Disp: 30 tablet, Rfl: 1 .  Multiple Vitamins-Minerals (ALIVE ONCE DAILY WOMENS 50+ PO), Take 1 tablet by mouth daily., Disp: , Rfl:  .  nitroGLYCERIN (NITROSTAT) 0.4 MG SL tablet, DISSOLVE ONE TABLET UNDER THE TONGUE EVERY 5 MINUTES AS NEEDED FOR CHEST PAIN. (Patient not taking: Reported on 05/16/2020), Disp: 25 tablet, Rfl: 3 .  Omega-3 Fatty Acids (FISH OIL PO), Take 1 capsule by mouth daily., Disp: , Rfl:  .  potassium chloride (KLOR-CON) 10 MEQ tablet, Take 2 tablets (20 mEq total) by mouth daily., Disp: 60 tablet, Rfl: 1 .  spironolactone (ALDACTONE) 25 MG tablet, Take 1 tablet (25 mg total) by mouth daily., Disp: 30 tablet, Rfl: 3 .  torsemide (DEMADEX) 20 MG tablet, Take 2 tablets (40 mg total) by mouth daily., Disp: 60 tablet, Rfl: 3 .  Travoprost, BAK Free, (TRAVATAN) 0.004 % SOLN ophthalmic solution, Place 1 drop into both eyes at bedtime. , Disp: , Rfl:  Allergies  Allergen Reactions  . Other Other (See Comments)    Blood products- Patient won't accept any  . Sulfa Antibiotics Rash  . Uloric [Febuxostat] Rash     Social History   Socioeconomic History  . Marital status: Widowed    Spouse name: Not on file  . Number of children: 3  . Years of education: Not on file  . Highest education level: Some college, no degree  Occupational History  . Occupation: retired  Tobacco Use  . Smoking status: Former Smoker    Packs/day: 1.00    Years: 22.00    Pack years: 22.00    Types: Cigarettes    Quit date: 1983    Years since quitting: 39.0  . Smokeless tobacco: Never Used  Vaping Use  . Vaping Use: Never used  Substance and Sexual Activity  . Alcohol use: Yes    Comment: 05/22/2018 "glass of wine couple times/wk"  . Drug use: Not Currently  . Sexual activity: Not Currently    Birth control/protection: None,  Post-menopausal  Other Topics Concern  . Not on file  Social History Narrative  . Not on file   Social Determinants of Health   Financial Resource Strain: Not on file  Food Insecurity: Not on file  Transportation Needs: Not on file  Physical Activity: Not on file  Stress: Not on file  Social Connections: Not on file  Intimate Partner Violence: Not on file    Physical Exam Vitals reviewed.  Constitutional:      Appearance: She is normal weight.  HENT:     Head: Normocephalic.     Nose: Nose normal.     Mouth/Throat:     Mouth: Mucous membranes are moist.     Pharynx: Oropharynx is clear.  Eyes:     Pupils: Pupils  are equal, round, and reactive to light.  Cardiovascular:     Rate and Rhythm: Normal rate and regular rhythm.     Pulses: Normal pulses.     Heart sounds: Normal heart sounds.  Pulmonary:     Effort: Pulmonary effort is normal.     Breath sounds: Normal breath sounds.  Abdominal:     General: Abdomen is flat.     Palpations: Abdomen is soft.  Musculoskeletal:        General: Normal range of motion.     Right lower leg: No edema.     Left lower leg: No edema.  Skin:    General: Skin is warm and dry.     Capillary Refill: Capillary refill takes less than 2 seconds.  Neurological:     General: No focal deficit present.     Mental Status: She is alert. Mental status is at baseline.  Psychiatric:        Mood and Affect: Mood normal.     Arrived for home visit for [place] who was alert and oriented ambulating around in her apartment with no signs of distress reporting she is feeling good. [place] also reports she graduated the [employer] program successfully yesterday! Vitals were obtained and assessment noted no swelling, lungs clear and no complaints of dizziness, chest pain, shortness of breath or missed medication doses. Medications were verified and confirmed. Pill boxes were filled. [place] missing Isosorbide, pharmacy reporting they do not have it on her  list. I will have clinic staff send it over. Refills as noted. Levy plans to continue to go to the [facility] to exercise 2 times a week. This was encouraged as we are proud of [place] progress! Appointments reviewed. I will see patient in two weeks. Home visit complete.   Refills:  Eliquis Colchicine Isosorbide     Future Appointments  Date Time Provider [place]  06/03/2020  7:05 AM [facility] DEVICE REMOTES [facility]  07/06/2020  9:30 AM Rankin, Clent Demark, MD [facility] None  08/05/2020 10:50 AM Shamleffer, Melanie Crazier, MD [facility] None  09/02/2020  7:05 AM [facility] DEVICE REMOTES [facility]  12/02/2020  7:05 AM [facility] DEVICE REMOTES [facility]  03/03/2021  7:05 AM [facility] DEVICE REMOTES [facility]  06/02/2021  7:05 AM [facility] DEVICE REMOTES [facility]  09/01/2021  7:05 AM [facility] DEVICE REMOTES [facility]     ACTION: Home visit completed Next visit planned for two weeks

## 2020-06-02 NOTE — Progress Notes (Signed)
[place] Report   Patient Details  Name: Danielle [name] MRN: [id number] Date of Birth: 1945/12/08 Age: 75 y.o. PCP: Seward Carol, MD  Vitals:   06/01/20 1545  BP: 94/62  Pulse: 68  SpO2: 98%  Weight: 216 lb (98 kg)      [facility] Eval - 06/02/20 1100      Referral    Program Start Date 03/14/20   Final class 06/01/20     Measurement   Waist Circumference 47.5 inches    Hip Circumference 47 inches      Information for Trainer   Goals 30-45 of exercise every other day, find healthy recipes      Mobility and Daily Activities   I find it easy to walk up or down two or more flights of stairs. 2    I have no trouble taking out the trash. 2    I do housework such as vacuuming and dusting on my own without difficulty. 2    I can easily lift a gallon of milk (8lbs). 1    I can easily walk a mile. 1    I have no trouble reaching into high cupboards or reaching down to pick up something from the floor. 2    I do not have trouble doing out-door work such as Armed forces logistics/support/administrative officer, raking leaves, or gardening. 2      Mobility and Daily Activities   I feel younger than my age. 3    I feel independent. 2    I feel energetic. 2    I live an active life.  4    I feel strong. 3    I feel healthy. 2    I feel active as other people my age. 2      How fit and strong are you.   Fit and Strong Total Score 30          Past Medical History:  Diagnosis Date  . Acute combined systolic and diastolic congestive heart failure ([place]) 02/05/2017  . 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 Acute kidney failure ([place]) 05/15/2018  . Arthritis    "all over" (05/22/2018)  . Blood dyscrasia    per pt-has small blood cells-appears as if anemic  . Breast cancer, left breast ([place]) 1985  . CHF (congestive heart failure) ([place])   . Complication of anesthesia    difficult to awaken from per pt  . Coronary artery disease   . Dyspnea   . Essential  hypertension   . Heart disease   . Heart murmur   . History of gout   . Hypothyroidism   . Obesity (BMI 30-39.9) 11/11/2017  . OSA (obstructive sleep apnea) 02/18/2017    severe obstructive sleep apnea with an AHI of 75.6/h and mild central sleep apnea with a CAI of 7.7/h.  Oxygen saturations dropped as low as 82%.   He is on CPAP at 9 cm H2O.  . OSA on CPAP   . Personal history of chemotherapy   . Personal history of radiation therapy   . Presence of permanent cardiac pacemaker   . Primary localized osteoarthritis of right knee 05/13/2018  . Refusal of blood transfusions as patient is Jehovah's Witness   . Type II diabetes mellitus ([place])    Past Surgical History:  Procedure Laterality Date  . BREAST BIOPSY Left 1985  . BREAST LUMPECTOMY Left 1985  . BREAST LUMPECTOMY WITH NEEDLE LOCALIZATION AND AXILLARY  LYMPH NODE DISSECTION  1985  . CATARACT EXTRACTION W/ INTRAOCULAR LENS  IMPLANT, BILATERAL Bilateral   . CORONARY ANGIOPLASTY WITH STENT PLACEMENT  2010, 2012,2017    2 done 2010 and 1 replaced 2012 and 2 replaced in 2017  . DILATION AND CURETTAGE OF UTERUS    . INSERT / REPLACE / REMOVE PACEMAKER  07/19/2014  . JOINT REPLACEMENT    . REPLACEMENT TOTAL KNEE Left 09/2012  . THYROIDECTOMY, PARTIAL  1978  . TONSILLECTOMY    . TOTAL KNEE ARTHROPLASTY Right 05/13/2018   Procedure: TOTAL KNEE ARTHROPLASTY;  Surgeon: Teryl Lucy, MD;  Location: [facility];  Service: Orthopedics;  Laterality: Right;  . TUBAL LIGATION     Social History   Tobacco Use  Smoking Status Former Smoker  . Packs/day: 1.00  . Years: 22.00  . Pack years: 22.00  . Types: Cigarettes  . Quit date: 39  . Years since quitting: 39.0  Smokeless Tobacco Never Used     Good gains In strength and cardio.  Walking better without can at times Encouraged to continue with exercise and strength training  [name] 06/02/2020, 11:54 AM

## 2020-06-03 NOTE — Progress Notes (Addendum)
Sent fax to [facility] on 06/02/2020 for Dexcom G6 with last 2 progress notes  Sent a copy to scan

## 2020-06-06 ENCOUNTER — Other Ambulatory Visit (HOSPITAL_COMMUNITY): Payer: Self-pay | Admitting: *Deleted

## 2020-06-06 MED ORDER — ISOSORBIDE MONONITRATE ER 30 MG PO TB24: ORAL_TABLET | ORAL | 3 refills | Status: DC

## 2020-06-06 NOTE — Progress Notes (Signed)
[facility] Reported to my office feeling like her blood sugar was low.  Able to eat a bar states feeling better about 1/2 way thru.  Admits to not eating much lunch. Did walk at home prior to coming into gym Encouraged her to speak with her MD about maybe decreasing dose Sugars have been running int eh 100's at home some mornings are in the 60's.  She will exercise again on Wednesday.  Will arrange transport for her

## 2020-06-07 DIAGNOSIS — H9209 Otalgia, unspecified ear: Secondary | ICD-10-CM | POA: Diagnosis not present

## 2020-06-07 DIAGNOSIS — H6120 Impacted cerumen, unspecified ear: Secondary | ICD-10-CM | POA: Diagnosis not present

## 2020-06-08 ENCOUNTER — Ambulatory Visit: Payer: Medicare HMO | Admitting: Internal Medicine

## 2020-06-08 ENCOUNTER — Telehealth: Payer: Self-pay | Admitting: Internal Medicine

## 2020-06-08 ENCOUNTER — Other Ambulatory Visit (HOSPITAL_COMMUNITY): Payer: Self-pay | Admitting: Internal Medicine

## 2020-06-08 ENCOUNTER — Telehealth (HOSPITAL_COMMUNITY): Payer: Self-pay

## 2020-06-08 MED ORDER — INSULIN GLARGINE 100 UNIT/ML ~~LOC~~ SOLN: 68.0000 [IU] | Freq: Every day | SUBCUTANEOUS | 2 refills | Status: DC

## 2020-06-08 NOTE — Telephone Encounter (Signed)
Refill sent as requested. 

## 2020-06-08 NOTE — Telephone Encounter (Signed)
Danielle [name] called me reporting she attempted to get her medications refilled today and was informed of a price increase for some of her medicines. She stated the prices for the following:  Eliquis- $47 Lantus- $100   I messaged Jodelle Gross reference Eliquis assistance program and Dr, [facility] in reference to her insulin.   Will continue to follow and assist.

## 2020-06-08 NOTE — Telephone Encounter (Signed)
Patient called and requested a refill for Lantus to be sent to the [facility] on [address]  Ph#  [phone]

## 2020-06-09 ENCOUNTER — Telehealth (HOSPITAL_COMMUNITY): Payer: Self-pay | Admitting: Pharmacy Technician

## 2020-06-09 NOTE — Telephone Encounter (Signed)
I was consulted regarding the patient's Eliquis by [name], EMT. The patient's current 30 day co-pay is $47. The patient stated that she could afford to pay that a few times, but eventually it would be too expensive.  I advised [name] that the patient would not be approved for BMS assistance until she has spent 3% of her income OOP on her medications. We can proceed with an application at that time.   [name], CPhT

## 2020-06-10 ENCOUNTER — Telehealth: Payer: Self-pay | Admitting: Internal Medicine

## 2020-06-10 NOTE — Telephone Encounter (Signed)
Left message informing patient refill was sent and she may have to ask the pharmacy to do an override for her to get her medication since prior authorization has not yet been done.

## 2020-06-10 NOTE — Telephone Encounter (Signed)
Spoke with insurance company they stated the Lantus is covered with patient's insurance with a copay of $100 , and they stated there is no alternative that will be covered by insurance patient would have to pay out of pocket.   Told patient she could use a goodRX or medication coupon and also gave her the number to LIS program([phone]) since she doesn't want to pay $100 co-pay...   Patient also states she has been without medicine for 3 days.

## 2020-06-10 NOTE — Telephone Encounter (Signed)
Please call insurance at [phone] regarding approval of Lantus or a substitute for Lantus.  Patients number for call back is [phone]

## 2020-06-10 NOTE — Telephone Encounter (Signed)
Patient called re: Patient's insurance does not cover Lantus. Patient's insurance does cover the following medications: Basaglar Levemir Tresiba  Patient requests a new Rx for one of the above covered medications be sent to  [facility] ([place]), [place] Phone:  [phone]  Fax:  [phone]

## 2020-06-10 NOTE — Telephone Encounter (Signed)
Spoke with insurance.   Approved case # [id number].  Patient aware.

## 2020-06-10 NOTE — Telephone Encounter (Signed)
Insurance company called stating they needed a Diagnosis code and whether or not we've tried to fill an alternative or if patient has been stable on the medication?   Ph# to reach insurance - [phone]

## 2020-06-10 NOTE — Telephone Encounter (Signed)
Patient states she is out of insulin and when the lantus was called in, pharmacy told her either a PA would need to be done or the Dr would need to call it in as either a tier 2 or 3. Please advise.

## 2020-06-11 ENCOUNTER — Other Ambulatory Visit: Payer: Self-pay | Admitting: Internal Medicine

## 2020-06-11 MED ORDER — TRESIBA FLEXTOUCH 100 UNIT/ML ~~LOC~~ SOPN: 64.0000 [IU] | PEN_INJECTOR | Freq: Every day | SUBCUTANEOUS | 6 refills | Status: DC

## 2020-06-14 ENCOUNTER — Telehealth (HOSPITAL_COMMUNITY): Payer: Self-pay

## 2020-06-14 ENCOUNTER — Telehealth: Payer: Self-pay | Admitting: Internal Medicine

## 2020-06-14 DIAGNOSIS — Z794 Long term (current) use of insulin: Secondary | ICD-10-CM

## 2020-06-14 MED ORDER — TRESIBA FLEXTOUCH 100 UNIT/ML ~~LOC~~ SOPN: 64.0000 [IU] | PEN_INJECTOR | Freq: Every day | SUBCUTANEOUS | 6 refills | Status: DC

## 2020-06-14 NOTE — Telephone Encounter (Signed)
She called over the weekend and I switched it to [place]

## 2020-06-14 NOTE — Telephone Encounter (Signed)
Spoke to Danielle [name] who reports she has no Lantus and the pharmacy and her doctors are working on prescribing something that is more affordable. Patient stated she is taking Humalog three times daily. We will be communicating about medication expenses and attempts to get lower cost insulin.   Danielle [name] reports she agreed with home visit for Thursday.

## 2020-06-14 NOTE — Telephone Encounter (Signed)
Please advise 

## 2020-06-14 NOTE — Telephone Encounter (Signed)
Pt called requesting a refill on her insulin but needs something other than "Lantus" because her insurance doesn't cover it and she needs something cheaper.  PHARMACY:   [facility] ([address] Phone:  [phone]  Fax:  [phone]

## 2020-06-14 NOTE — Telephone Encounter (Signed)
No I wasn't but there's another message about alteratives which I switched to [place].

## 2020-06-14 NOTE — Telephone Encounter (Signed)
Noted  

## 2020-06-14 NOTE — Telephone Encounter (Signed)
Dr Kelton Pillar,  Were you told about this?

## 2020-06-14 NOTE — Telephone Encounter (Signed)
Inform patient that Dr Kelton Pillar sent Danielle [name] to [facility] over the weekend on 06/11/2020. Asked for patient to call [facility] to confirm. However, she insisted on resenting Rx. I did as requested.

## 2020-06-16 ENCOUNTER — Telehealth (HOSPITAL_COMMUNITY): Payer: Self-pay | Admitting: Licensed Clinical Social Worker

## 2020-06-16 ENCOUNTER — Other Ambulatory Visit (HOSPITAL_COMMUNITY): Payer: Self-pay

## 2020-06-16 DIAGNOSIS — H9203 Otalgia, bilateral: Secondary | ICD-10-CM | POA: Diagnosis not present

## 2020-06-16 DIAGNOSIS — H6123 Impacted cerumen, bilateral: Secondary | ICD-10-CM | POA: Diagnosis not present

## 2020-06-16 DIAGNOSIS — H6243 Otitis externa in other diseases classified elsewhere, bilateral: Secondary | ICD-10-CM | POA: Diagnosis not present

## 2020-06-16 DIAGNOSIS — H938X3 Other specified disorders of ear, bilateral: Secondary | ICD-10-CM | POA: Diagnosis not present

## 2020-06-16 DIAGNOSIS — B369 Superficial mycosis, unspecified: Secondary | ICD-10-CM | POA: Diagnosis not present

## 2020-06-16 DIAGNOSIS — L299 Pruritus, unspecified: Secondary | ICD-10-CM | POA: Diagnosis not present

## 2020-06-16 DIAGNOSIS — M26623 Arthralgia of bilateral temporomandibular joint: Secondary | ICD-10-CM | POA: Diagnosis not present

## 2020-06-16 NOTE — Progress Notes (Signed)
Paramedicine Encounter    Patient ID: Danielle [name], female    DOB: 14-Sep-1945, 75 y.o.   MRN: [id number]   Patient Care Team: Seward Carol, MD as PCP - General (Internal Medicine) Constance Haw, MD as PCP - Electrophysiology (Cardiology) Sueanne Margarita, MD as PCP - Sleep Medicine (Cardiology) Jorge Ny, LCSW as Social Worker (Licensed Clinical Social Worker) Shamleffer, Melanie Crazier, MD as Consulting Physician (Endocrinology)  Patient Active Problem List   Diagnosis Date Noted  . Polyneuropathy associated with underlying disease ([place]) 05/04/2020  . Severe nonproliferative diabetic retinopathy of left eye, with macular edema, associated with type 2 diabetes mellitus ([place]) 11/05/2019  . Severe nonproliferative diabetic retinopathy of right eye, with macular edema, associated with type 2 diabetes mellitus ([place]) 11/05/2019  . Retinal hemorrhage of right eye 11/05/2019  . Retinal hemorrhage of left eye 11/05/2019  . Type 2 diabetes mellitus with hyperglycemia, with long-term current use of insulin ([place]) 05/14/2019  . Diabetes mellitus ([place]) 05/14/2019  . 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, with long-term current use of insulin ([place]) 05/14/2019  . Type 2 diabetes mellitus with diabetic polyneuropathy, with long-term current use of insulin ([place]) 05/14/2019  . Encephalopathy acute 05/20/2018  . 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 Acute kidney failure ([place]) 05/15/2018  . Primary localized osteoarthritis of right knee 05/13/2018  . (HFpEF) heart failure with preserved ejection fraction ([place]) 12/20/2017  . Obesity (BMI 30-39.9) 11/11/2017  . OSA (obstructive sleep apnea) 02/18/2017  . Essential hypertension   . Diabetes mellitus without complication ([place])   . Cancer ([place])   . Thyroid disease     Current Outpatient Medications:  .  acetaminophen (TYLENOL) 500 MG tablet, Take 500-1,000 mg by mouth every 6 (six) hours as  needed (for pain or headaches)., Disp: , Rfl:  .  allopurinol (ZYLOPRIM) 100 MG tablet, Take 1 tablet (100 mg total) by mouth daily., Disp: , Rfl:  .  atorvastatin (LIPITOR) 80 MG tablet, Take 1 tablet (80 mg total) by mouth at bedtime., Disp: 30 tablet, Rfl: 1 .  Biotin 10 MG CAPS, Take 10 mg by mouth daily. , Disp: , Rfl:  .  Blood Glucose Monitoring Suppl (ACCU-CHEK AVIVA) device, by Other route. Use as instructed, Disp: , Rfl:  .  carvedilol (COREG) 25 MG tablet, Take 1 tablet (25 mg total) by mouth 2 (two) times daily with a meal., Disp: 30 tablet, Rfl: 1 .  colchicine 0.6 MG tablet, TAKE 1 TABLET BY MOUTH EVERY OTHER DAY TO PREVENT FLARES, Disp: , Rfl:  .  Continuous Blood Gluc Sensor (DEXCOM G6 SENSOR) MISC, 1 Device by Does not apply route as directed. (Patient not taking: Reported on 06/02/2020), Disp: 3 each, Rfl: 11 .  Continuous Blood Gluc Transmit (DEXCOM G6 TRANSMITTER) MISC, 1 Device by Does not apply route as directed. (Patient not taking: Reported on 06/02/2020), Disp: 1 each, Rfl: 3 .  ELIQUIS 5 MG TABS tablet, Take 1 tablet by mouth twice daily, Disp: 60 tablet, Rfl: 3 .  empagliflozin (JARDIANCE) 10 MG TABS tablet, Take 1 tablet (10 mg total) by mouth daily., Disp: 30 tablet, Rfl: 1 .  ferrous sulfate 325 (65 FE) MG tablet, Take 1 tablet (325 mg total) by mouth daily with breakfast., Disp: 90 tablet, Rfl: 3 .  gabapentin (NEURONTIN) 300 MG capsule, Take 300 mg by mouth daily. , Disp: , Rfl:  .  glucose blood test strip, Use as instructed to test blood  sugars 3 times daily E11.65  Freestyle lite, Disp: 400 each, Rfl: 3 .  insulin degludec (TRESIBA FLEXTOUCH) 100 UNIT/ML FlexTouch Pen, Inject 64 Units into the skin daily., Disp: 30 mL, Rfl: 6 .  insulin lispro (HUMALOG KWIKPEN) 200 UNIT/ML KwikPen, Inject 22 Units into the skin 3 (three) times daily., Disp: 30 mL, Rfl: 6 .  Insulin Pen Needle 32G X 4 MM MISC, 1 Device by Does not apply route 4 (four) times daily., Disp: 400 each, Rfl:  3 .  isosorbide mononitrate (IMDUR) 30 MG 24 hr tablet, TAKE 1 TABLET BY MOUTH ONCE DAILY, Disp: 30 tablet, Rfl: 3 .  levothyroxine (SYNTHROID, LEVOTHROID) 75 MCG tablet, Take 75 mcg by mouth daily before breakfast., Disp: , Rfl:  .  losartan (COZAAR) 50 MG tablet, TAKE 1 TABLET BY MOUTH AT BEDTIME, Disp: 30 tablet, Rfl: 1 .  Multiple Vitamins-Minerals (ALIVE ONCE DAILY WOMENS 50+ PO), Take 1 tablet by mouth daily., Disp: , Rfl:  .  nitroGLYCERIN (NITROSTAT) 0.4 MG SL tablet, DISSOLVE ONE TABLET UNDER THE TONGUE EVERY 5 MINUTES AS NEEDED FOR CHEST PAIN. (Patient not taking: No sig reported), Disp: 25 tablet, Rfl: 3 .  Omega-3 Fatty Acids (FISH OIL PO), Take 1 capsule by mouth daily., Disp: , Rfl:  .  potassium chloride (KLOR-CON) 10 MEQ tablet, Take 2 tablets (20 mEq total) by mouth daily., Disp: 60 tablet, Rfl: 1 .  spironolactone (ALDACTONE) 25 MG tablet, Take 1 tablet (25 mg total) by mouth daily., Disp: 30 tablet, Rfl: 3 .  torsemide (DEMADEX) 20 MG tablet, Take 2 tablets (40 mg total) by mouth daily., Disp: 60 tablet, Rfl: 3 .  Travoprost, BAK Free, (TRAVATAN) 0.004 % SOLN ophthalmic solution, Place 1 drop into both eyes at bedtime. , Disp: , Rfl:  Allergies  Allergen Reactions  . Other Other (See Comments)    Blood products- Patient won't accept any  . Sulfa Antibiotics Rash  . Uloric [Febuxostat] Rash     Social History   Socioeconomic History  . Marital status: Widowed    Spouse name: Not on file  . Number of children: 3  . Years of education: Not on file  . Highest education level: Some college, no degree  Occupational History  . Occupation: retired  Tobacco Use  . Smoking status: Former Smoker    Packs/day: 1.00    Years: 22.00    Pack years: 22.00    Types: Cigarettes    Quit date: 1983    Years since quitting: 39.0  . Smokeless tobacco: Never Used  Vaping Use  . Vaping Use: Never used  Substance and Sexual Activity  . Alcohol use: Yes    Comment: 05/22/2018  "glass of wine couple times/wk"  . Drug use: Not Currently  . Sexual activity: Not Currently    Birth control/protection: None, Post-menopausal  Other Topics Concern  . Not on file  Social History Narrative  . Not on file   Social Determinants of Health   Financial Resource Strain: Not on file  Food Insecurity: Not on file  Transportation Needs: Not on file  Physical Activity: Not on file  Stress: Not on file  Social Connections: Not on file  Intimate Partner Violence: Not on file    Physical Exam Vitals reviewed.  Constitutional:      Appearance: Danielle [name] is normal weight.  HENT:     Head: Normocephalic.     Nose: Nose normal.     Mouth/Throat:     Mouth: Mucous membranes  are moist.     Pharynx: Oropharynx is clear.  Eyes:     Pupils: Pupils are equal, round, and reactive to light.  Cardiovascular:     Rate and Rhythm: Normal rate and regular rhythm.     Pulses: Normal pulses.     Heart sounds: Normal heart sounds.  Pulmonary:     Effort: Pulmonary effort is normal.     Breath sounds: Normal breath sounds.  Abdominal:     Palpations: Abdomen is soft.  Musculoskeletal:        General: No swelling. Normal range of motion.     Cervical back: Normal range of motion.     Right lower leg: No edema.     Left lower leg: No edema.  Skin:    General: Skin is warm and dry.     Capillary Refill: Capillary refill takes less than 2 seconds.  Neurological:     General: No focal deficit present.     Mental Status: Danielle [name] is alert. Mental status is at baseline.  Psychiatric:        Mood and Affect: Mood normal.    Arrived for home visit for Danielle [name] who was alert and oriented reporting Danielle [name] was feeling okay just feeling stiff due to not being at the gym this week.   Danielle [name] reports Danielle [name] recently accidentally changed her insurance plan from [place] to [employer] over the phone with someone who called her suggesting Danielle [name] switch. Now her copays are more expensive and Danielle [name] is having  trouble affording her medications. Danielle [name] was able to get her Endincrinologist to switch her insulin to be more affordable and her Eliquis Danielle [name] reports Danielle [name] can pay for a few more times then we could get her singed up for patient assistance with BMS. Danielle [name] agreed with plan. Danielle [name] asked to speak to SW about insurance, I forwarded information to Danielle [name].   (Colchicine $76) (Eliquis $46)   Vitals were obtained. Medications reviewed and confirmed. 2 pill boxes filled for Danielle [name]. We reviewed appointments and confirmed same. Danielle [name] was [facility] get [facility] at [facility] at 1:30.   Home visit complete. I plan to see Danielle [name] in two weeks.   Refills: This week- Eliquis Next week-  Spironolactone Jardiance Torsemide Isosorbide    -UPDATE-  Spoke to [employer] they report they do not have the Dexcom on her profile, once we get her insurance figured out I will reach out to doctor to resend same.      Future Appointments  Date Time Provider [place]  07/06/2020  9:30 AM Rankin, Clent Demark, MD [facility] None  08/05/2020 10:50 AM Shamleffer, Melanie Crazier, MD [facility] None  09/02/2020  7:05 AM [facility] DEVICE REMOTES [facility]  12/02/2020  7:05 AM [facility] DEVICE REMOTES [facility]  03/03/2021  7:05 AM [facility] DEVICE REMOTES [facility]  06/02/2021  7:05 AM [facility] DEVICE REMOTES [facility]  09/01/2021  7:05 AM [facility] DEVICE REMOTES [facility]     ACTION: Home visit completed Next visit planned for two weeks

## 2020-06-16 NOTE — Telephone Encounter (Signed)
CSW informed by [employer] that pt has ENT appt today which she was unable to schedule [facility] transportation to in time and wondering if we can assist with transport.  CSW able to set up ride through [employer]- pt informed of estimated pick up time and provided [facility] number to call when she is done with her appt.  Will continue to follow and assist as needed  Jorge Ny, [facility] Desk#: [phone] Cell#: [phone]

## 2020-06-28 DIAGNOSIS — H401123 Primary open-angle glaucoma, left eye, severe stage: Secondary | ICD-10-CM | POA: Diagnosis not present

## 2020-06-28 DIAGNOSIS — E113212 Type 2 diabetes mellitus with mild nonproliferative diabetic retinopathy with macular edema, left eye: Secondary | ICD-10-CM | POA: Diagnosis not present

## 2020-06-28 DIAGNOSIS — Z961 Presence of intraocular lens: Secondary | ICD-10-CM | POA: Diagnosis not present

## 2020-06-28 DIAGNOSIS — E113291 Type 2 diabetes mellitus with mild nonproliferative diabetic retinopathy without macular edema, right eye: Secondary | ICD-10-CM | POA: Diagnosis not present

## 2020-06-28 DIAGNOSIS — E1139 Type 2 diabetes mellitus with other diabetic ophthalmic complication: Secondary | ICD-10-CM | POA: Diagnosis not present

## 2020-06-28 DIAGNOSIS — Z794 Long term (current) use of insulin: Secondary | ICD-10-CM | POA: Diagnosis not present

## 2020-06-28 DIAGNOSIS — H401112 Primary open-angle glaucoma, right eye, moderate stage: Secondary | ICD-10-CM | POA: Diagnosis not present

## 2020-06-28 DIAGNOSIS — H16223 Keratoconjunctivitis sicca, not specified as Sjogren's, bilateral: Secondary | ICD-10-CM | POA: Diagnosis not present

## 2020-06-30 DIAGNOSIS — Z8669 Personal history of other diseases of the nervous system and sense organs: Secondary | ICD-10-CM | POA: Diagnosis not present

## 2020-06-30 DIAGNOSIS — L299 Pruritus, unspecified: Secondary | ICD-10-CM | POA: Diagnosis not present

## 2020-06-30 DIAGNOSIS — H938X3 Other specified disorders of ear, bilateral: Secondary | ICD-10-CM | POA: Diagnosis not present

## 2020-07-01 DIAGNOSIS — N898 Other specified noninflammatory disorders of vagina: Secondary | ICD-10-CM | POA: Diagnosis not present

## 2020-07-04 ENCOUNTER — Encounter (HOSPITAL_COMMUNITY): Payer: Medicare HMO | Admitting: Internal Medicine

## 2020-07-04 ENCOUNTER — Other Ambulatory Visit (HOSPITAL_COMMUNITY): Payer: Self-pay

## 2020-07-04 NOTE — Progress Notes (Signed)
Paramedicine Encounter    Patient ID: Danielle [name], female    DOB: 06-May-1946, 75 y.o.   MRN: [id number]   Patient Care Team: Seward Carol, MD as PCP - General (Internal Medicine) Constance Haw, MD as PCP - Electrophysiology (Cardiology) Sueanne Margarita, MD as PCP - Sleep Medicine (Cardiology) Jorge Ny, LCSW as Social Worker (Licensed Clinical Social Worker) Shamleffer, Melanie Crazier, MD as Consulting Physician (Endocrinology)  Patient Active Problem List   Diagnosis Date Noted  . Polyneuropathy associated with underlying disease ([place]) 05/04/2020  . Severe nonproliferative diabetic retinopathy of left eye, with macular edema, associated with type 2 diabetes mellitus ([place]) 11/05/2019  . Severe nonproliferative diabetic retinopathy of right eye, with macular edema, associated with type 2 diabetes mellitus ([place]) 11/05/2019  . Retinal hemorrhage of right eye 11/05/2019  . Retinal hemorrhage of left eye 11/05/2019  . Type 2 diabetes mellitus with hyperglycemia, with long-term current use of insulin ([place]) 05/14/2019  . Diabetes mellitus ([place]) 05/14/2019  . 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, with long-term current use of insulin ([place]) 05/14/2019  . Type 2 diabetes mellitus with diabetic polyneuropathy, with long-term current use of insulin ([place]) 05/14/2019  . Encephalopathy acute 05/20/2018  . 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 Acute kidney failure ([place]) 05/15/2018  . Primary localized osteoarthritis of right knee 05/13/2018  . (HFpEF) heart failure with preserved ejection fraction ([place]) 12/20/2017  . Obesity (BMI 30-39.9) 11/11/2017  . OSA (obstructive sleep apnea) 02/18/2017  . Essential hypertension   . Diabetes mellitus without complication ([place])   . Cancer ([place])   . Thyroid disease     Current Outpatient Medications:  .  acetaminophen (TYLENOL) 500 MG tablet, Take 500-1,000 mg by mouth every 6 (six) hours as  needed (for pain or headaches)., Disp: , Rfl:  .  allopurinol (ZYLOPRIM) 100 MG tablet, Take 1 tablet (100 mg total) by mouth daily., Disp: , Rfl:  .  atorvastatin (LIPITOR) 80 MG tablet, Take 1 tablet (80 mg total) by mouth at bedtime., Disp: 30 tablet, Rfl: 1 .  Biotin 10 MG CAPS, Take 10 mg by mouth daily. , Disp: , Rfl:  .  Blood Glucose Monitoring Suppl (ACCU-CHEK AVIVA) device, by Other route. Use as instructed, Disp: , Rfl:  .  carvedilol (COREG) 25 MG tablet, Take 1 tablet (25 mg total) by mouth 2 (two) times daily with a meal., Disp: 30 tablet, Rfl: 1 .  colchicine 0.6 MG tablet, TAKE 1 TABLET BY MOUTH EVERY OTHER DAY TO PREVENT FLARES (Patient not taking: Reported on 06/16/2020), Disp: , Rfl:  .  Continuous Blood Gluc Sensor (DEXCOM G6 SENSOR) MISC, 1 Device by Does not apply route as directed. (Patient not taking: No sig reported), Disp: 3 each, Rfl: 11 .  Continuous Blood Gluc Transmit (DEXCOM G6 TRANSMITTER) MISC, 1 Device by Does not apply route as directed. (Patient not taking: No sig reported), Disp: 1 each, Rfl: 3 .  ELIQUIS 5 MG TABS tablet, Take 1 tablet by mouth twice daily, Disp: 60 tablet, Rfl: 3 .  empagliflozin (JARDIANCE) 10 MG TABS tablet, Take 1 tablet (10 mg total) by mouth daily., Disp: 30 tablet, Rfl: 1 .  ferrous sulfate 325 (65 FE) MG tablet, Take 1 tablet (325 mg total) by mouth daily with breakfast., Disp: 90 tablet, Rfl: 3 .  gabapentin (NEURONTIN) 300 MG capsule, Take 300 mg by mouth daily. , Disp: , Rfl:  .  glucose blood test strip,  Use as instructed to test blood sugars 3 times daily E11.65  Freestyle lite, Disp: 400 each, Rfl: 3 .  insulin degludec (TRESIBA FLEXTOUCH) 100 UNIT/ML FlexTouch Pen, Inject 64 Units into the skin daily., Disp: 30 mL, Rfl: 6 .  insulin lispro (HUMALOG KWIKPEN) 200 UNIT/ML KwikPen, Inject 22 Units into the skin 3 (three) times daily., Disp: 30 mL, Rfl: 6 .  Insulin Pen Needle 32G X 4 MM MISC, 1 Device by Does not apply route 4 (four)  times daily., Disp: 400 each, Rfl: 3 .  isosorbide mononitrate (IMDUR) 30 MG 24 hr tablet, TAKE 1 TABLET BY MOUTH ONCE DAILY, Disp: 30 tablet, Rfl: 3 .  levothyroxine (SYNTHROID, LEVOTHROID) 75 MCG tablet, Take 75 mcg by mouth daily before breakfast., Disp: , Rfl:  .  losartan (COZAAR) 50 MG tablet, TAKE 1 TABLET BY MOUTH AT BEDTIME, Disp: 30 tablet, Rfl: 1 .  Multiple Vitamins-Minerals (ALIVE ONCE DAILY WOMENS 50+ PO), Take 1 tablet by mouth daily., Disp: , Rfl:  .  nitroGLYCERIN (NITROSTAT) 0.4 MG SL tablet, DISSOLVE ONE TABLET UNDER THE TONGUE EVERY 5 MINUTES AS NEEDED FOR CHEST PAIN. (Patient not taking: No sig reported), Disp: 25 tablet, Rfl: 3 .  Omega-3 Fatty Acids (FISH OIL PO), Take 1 capsule by mouth daily., Disp: , Rfl:  .  potassium chloride (KLOR-CON) 10 MEQ tablet, Take 2 tablets (20 mEq total) by mouth daily., Disp: 60 tablet, Rfl: 1 .  spironolactone (ALDACTONE) 25 MG tablet, Take 1 tablet (25 mg total) by mouth daily., Disp: 30 tablet, Rfl: 3 .  torsemide (DEMADEX) 20 MG tablet, Take 2 tablets (40 mg total) by mouth daily., Disp: 60 tablet, Rfl: 3 .  Travoprost, BAK Free, (TRAVATAN) 0.004 % SOLN ophthalmic solution, Place 1 drop into both eyes at bedtime. , Disp: , Rfl:  Allergies  Allergen Reactions  . Other Other (See Comments)    Blood products- Patient won't accept any  . Sulfa Antibiotics Rash  . Uloric [Febuxostat] Rash     Social History   Socioeconomic History  . Marital status: Widowed    Spouse name: Not on file  . Number of children: 3  . Years of education: Not on file  . Highest education level: Some college, no degree  Occupational History  . Occupation: retired  Tobacco Use  . Smoking status: Former Smoker    Packs/day: 1.00    Years: 22.00    Pack years: 22.00    Types: Cigarettes    Quit date: 1983    Years since quitting: 39.1  . Smokeless tobacco: Never Used  Vaping Use  . Vaping Use: Never used  Substance and Sexual Activity  . Alcohol  use: Yes    Comment: 05/22/2018 "glass of wine couple times/wk"  . Drug use: Not Currently  . Sexual activity: Not Currently    Birth control/protection: None, Post-menopausal  Other Topics Concern  . Not on file  Social History Narrative  . Not on file   Social Determinants of Health   Financial Resource Strain: Not on file  Food Insecurity: Not on file  Transportation Needs: Not on file  Physical Activity: Not on file  Stress: Not on file  Social Connections: Not on file  Intimate Partner Violence: Not on file    Physical Exam Vitals reviewed.  Constitutional:      Appearance: Normal appearance. She is normal weight.  HENT:     Head: Normocephalic.     Nose: Nose normal.  Mouth/Throat:     Mouth: Mucous membranes are moist.     Pharynx: Oropharynx is clear.  Eyes:     Pupils: Pupils are equal, round, and reactive to light.  Cardiovascular:     Rate and Rhythm: Normal rate and regular rhythm.     Pulses: Normal pulses.     Heart sounds: Normal heart sounds.  Pulmonary:     Effort: Pulmonary effort is normal.     Breath sounds: Normal breath sounds.  Abdominal:     Palpations: Abdomen is soft.  Musculoskeletal:        General: Swelling present. Normal range of motion.     Cervical back: Normal range of motion.     Right lower leg: No edema.     Left lower leg: No edema.  Skin:    General: Skin is warm and dry.     Capillary Refill: Capillary refill takes less than 2 seconds.  Neurological:     General: No focal deficit present.     Mental Status: She is alert. Mental status is at baseline.  Psychiatric:        Mood and Affect: Mood normal.       Arrived for home visit for [place] who was alert and oriented reporting she has been feeling okay overall with no complaints of shortness of breath, dizziness, chest pain or trouble with appetite or issues sleeping. Dajae reports she is going to the gym at least twice a week. Pinkey continues to improve with  her weight and overall strength. Vitals were obtained and are as noted. No JVD with clear lung sounds. Some slight swelling in lower legs but no pitting edema. Janaya has been compliant with all her medicines. I reviewed medications and filled pill boxes accordingly. Aubriana and I reviewed her insulin doses and educated her on timing of meals with insulin. Britteny understood same. Shery and I reviewed appointments and agreed with same. Priscila agreed upon home visit in two weeks.   Refiils: Levothyroxine (next week) Torsemide (next week)  CBG- 185    Future Appointments  Date Time Provider [place]  07/06/2020  9:30 AM Rankin, Clent Demark, MD [facility] None  08/05/2020 10:50 AM Shamleffer, Melanie Crazier, MD [facility] None  09/02/2020  7:05 AM [facility] DEVICE REMOTES [facility]  10/19/2020 10:20 AM Bensimhon, Shaune Pascal, MD [facility] None  12/02/2020  7:05 AM [facility] DEVICE REMOTES [facility]  03/03/2021  7:05 AM [facility] DEVICE REMOTES [facility]  06/02/2021  7:05 AM [facility] DEVICE REMOTES [facility]  09/01/2021  7:05 AM [facility] DEVICE REMOTES [facility]     ACTION: Home visit completed Next visit planned for two weeks

## 2020-07-06 ENCOUNTER — Encounter (INDEPENDENT_AMBULATORY_CARE_PROVIDER_SITE_OTHER): Payer: Self-pay | Admitting: Ophthalmology

## 2020-07-06 ENCOUNTER — Ambulatory Visit (INDEPENDENT_AMBULATORY_CARE_PROVIDER_SITE_OTHER): Payer: Medicare HMO | Admitting: Ophthalmology

## 2020-07-06 ENCOUNTER — Other Ambulatory Visit: Payer: Self-pay

## 2020-07-06 DIAGNOSIS — E113412 Type 2 diabetes mellitus with severe nonproliferative diabetic retinopathy with macular edema, left eye: Secondary | ICD-10-CM

## 2020-07-06 DIAGNOSIS — E113411 Type 2 diabetes mellitus with severe nonproliferative diabetic retinopathy with macular edema, right eye: Secondary | ICD-10-CM

## 2020-07-06 NOTE — Progress Notes (Signed)
07/06/2020     CHIEF COMPLAINT Patient presents for Retina Follow Up (6 WK FOCAL OS////Pt reports stable vision OU, some dryness after CPAP use. Pt denies any new F/F, pain, or pressure OU. ////Last A1C: around 8 taken 04/2020                              Last BS: around 160 this AM )   HISTORY OF PRESENT ILLNESS: Danielle [name] is a 75 y.o. female who presents to the clinic today for:   HPI    Retina Follow Up    Patient presents with  Diabetic Retinopathy.  In left eye. Additional comments: 6 WK FOCAL OS    Pt reports stable vision OU, some dryness after CPAP use. Pt denies any new F/F, pain, or pressure OU.     Last A1C: around 8 taken 04/2020                              Last BS: around 160 this AM        Last edited by Nichola Sizer D on 07/06/2020  9:31 AM. (History)      Referring physician: Seward Carol, MD [address] [place],  [place] [address]  HISTORICAL INFORMATION:   Selected notes from the MEDICAL RECORD NUMBER    Lab Results  Component Value Date   HGBA1C 8.2 (H) 04/25/2020     CURRENT MEDICATIONS: Current Outpatient Medications (Ophthalmic Drugs)  Medication Sig  . Travoprost, BAK Free, (TRAVATAN) 0.004 % SOLN ophthalmic solution Place 1 drop into both eyes at bedtime.    No current facility-administered medications for this visit. (Ophthalmic Drugs)   Current Outpatient Medications (Other)  Medication Sig  . acetaminophen (TYLENOL) 500 MG tablet Take 500-1,000 mg by mouth every 6 (six) hours as needed (for pain or headaches).  Marland Kitchen allopurinol (ZYLOPRIM) 100 MG tablet Take 1 tablet (100 mg total) by mouth daily.  Marland Kitchen atorvastatin (LIPITOR) 80 MG tablet Take 1 tablet (80 mg total) by mouth at bedtime.  . Biotin 10 MG CAPS Take 10 mg by mouth daily.   . Blood Glucose Monitoring Suppl (ACCU-CHEK AVIVA) device by Other route. Use as instructed  . carvedilol (COREG) 25 MG tablet Take 1 tablet (25 mg total) by mouth 2 (two) times daily  with a meal.  . colchicine 0.6 MG tablet TAKE 1 TABLET BY MOUTH EVERY OTHER DAY TO PREVENT FLARES (Patient not taking: No sig reported)  . Continuous Blood Gluc Sensor (DEXCOM G6 SENSOR) MISC 1 Device by Does not apply route as directed. (Patient not taking: No sig reported)  . Continuous Blood Gluc Transmit (DEXCOM G6 TRANSMITTER) MISC 1 Device by Does not apply route as directed. (Patient not taking: No sig reported)  . ELIQUIS 5 MG TABS tablet Take 1 tablet by mouth twice daily  . empagliflozin (JARDIANCE) 10 MG TABS tablet Take 1 tablet (10 mg total) by mouth daily.  . ferrous sulfate 325 (65 FE) MG tablet Take 1 tablet (325 mg total) by mouth daily with breakfast.  . gabapentin (NEURONTIN) 300 MG capsule Take 300 mg by mouth daily.   Marland Kitchen glucose blood test strip Use as instructed to test blood sugars 3 times daily E11.65  Freestyle lite  . insulin degludec (TRESIBA FLEXTOUCH) 100 UNIT/ML FlexTouch Pen Inject 64 Units into the skin daily.  . insulin lispro (HUMALOG  KWIKPEN) 200 UNIT/ML KwikPen Inject 22 Units into the skin 3 (three) times daily.  . Insulin Pen Needle 32G X 4 MM MISC 1 Device by Does not apply route 4 (four) times daily.  . isosorbide mononitrate (IMDUR) 30 MG 24 hr tablet TAKE 1 TABLET BY MOUTH ONCE DAILY  . levothyroxine (SYNTHROID, LEVOTHROID) 75 MCG tablet Take 75 mcg by mouth daily before breakfast.  . losartan (COZAAR) 50 MG tablet TAKE 1 TABLET BY MOUTH AT BEDTIME  . Multiple Vitamins-Minerals (ALIVE ONCE DAILY WOMENS 50+ PO) Take 1 tablet by mouth daily.  . nitroGLYCERIN (NITROSTAT) 0.4 MG SL tablet DISSOLVE ONE TABLET UNDER THE TONGUE EVERY 5 MINUTES AS NEEDED FOR CHEST PAIN. (Patient not taking: No sig reported)  . Omega-3 Fatty Acids (FISH OIL PO) Take 1 capsule by mouth daily.  . potassium chloride (KLOR-CON) 10 MEQ tablet Take 2 tablets (20 mEq total) by mouth daily.  Marland Kitchen spironolactone (ALDACTONE) 25 MG tablet Take 1 tablet (25 mg total) by mouth daily.  Marland Kitchen torsemide  (DEMADEX) 20 MG tablet Take 2 tablets (40 mg total) by mouth daily.   No current facility-administered medications for this visit. (Other)      REVIEW OF SYSTEMS:    ALLERGIES Allergies  Allergen Reactions  . Other Other (See Comments)    Blood products- Patient won't accept any  . Sulfa Antibiotics Rash  . Uloric [Febuxostat] Rash    PAST MEDICAL HISTORY Past Medical History:  Diagnosis Date  . Acute combined systolic and diastolic congestive heart failure ([place]) 02/05/2017  . 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 Acute kidney failure ([place]) 05/15/2018  . Arthritis    "all over" (05/22/2018)  . Blood dyscrasia    per pt-has small blood cells-appears as if anemic  . Breast cancer, left breast ([place]) 1985  . CHF (congestive heart failure) ([place])   . Complication of anesthesia    difficult to awaken from per pt  . Coronary artery disease   . Dyspnea   . Essential hypertension   . Heart disease   . Heart murmur   . History of gout   . Hypothyroidism   . Obesity (BMI 30-39.9) 11/11/2017  . OSA (obstructive sleep apnea) 02/18/2017    severe obstructive sleep apnea with an AHI of 75.6/h and mild central sleep apnea with a CAI of 7.7/h.  Oxygen saturations dropped as low as 82%.   He is on CPAP at 9 cm H2O.  . OSA on CPAP   . Personal history of chemotherapy   . Personal history of radiation therapy   . Presence of permanent cardiac pacemaker   . Primary localized osteoarthritis of right knee 05/13/2018  . Refusal of blood transfusions as patient is Jehovah's Witness   . Type II diabetes mellitus ([place])    Past Surgical History:  Procedure Laterality Date  . BREAST BIOPSY Left 1985  . BREAST LUMPECTOMY Left 1985  . BREAST LUMPECTOMY WITH NEEDLE LOCALIZATION AND AXILLARY LYMPH NODE DISSECTION  1985  . CATARACT EXTRACTION W/ INTRAOCULAR LENS  IMPLANT, BILATERAL Bilateral   . CORONARY ANGIOPLASTY WITH STENT PLACEMENT  2010, 2012,2017    2  done 2010 and 1 replaced 2012 and 2 replaced in 2017  . DILATION AND CURETTAGE OF UTERUS    . INSERT / REPLACE / REMOVE PACEMAKER  07/19/2014  . JOINT REPLACEMENT    . REPLACEMENT TOTAL KNEE Left 09/2012  . THYROIDECTOMY, PARTIAL  1978  . TONSILLECTOMY    .  TOTAL KNEE ARTHROPLASTY Right 05/13/2018   Procedure: TOTAL KNEE ARTHROPLASTY;  Surgeon: Marchia Bond, MD;  Location: [facility];  Service: Orthopedics;  Laterality: Right;  . TUBAL LIGATION      FAMILY HISTORY Family History  Problem Relation Age of Onset  . Multiple myeloma Mother   . Heart disease Father   . Other Sister   . Heart disease Brother   . Diabetes Sister   . Other Sister     SOCIAL HISTORY Social History   Tobacco Use  . Smoking status: Former Smoker    Packs/day: 1.00    Years: 22.00    Pack years: 22.00    Types: Cigarettes    Quit date: 1983    Years since quitting: 39.1  . Smokeless tobacco: Never Used  Vaping Use  . Vaping Use: Never used  Substance Use Topics  . Alcohol use: Yes    Comment: 05/22/2018 "glass of wine couple times/wk"  . Drug use: Not Currently         OPHTHALMIC EXAM: Base Eye Exam    Visual Acuity (ETDRS)      Right Left   Dist cc 20/40 +2 20/40   Dist ph cc NI 20/40 +1   Correction: Glasses       Tonometry (Tonopen, 9:37 AM)      Right Left   Pressure 19 18       Pupils      Pupils Dark Light Shape React APD   Right PERRL 3 2 Round Brisk None   Left PERRL 3 2 Round Brisk None       Visual Fields (Counting fingers)      Left Right     Full   Restrictions Partial outer superior temporal deficiency        Extraocular Movement      Right Left    Full Full       Neuro/Psych    Oriented x3: Yes   Mood/Affect: Normal       Dilation    Left eye: 1.0% Mydriacyl, 2.5% Phenylephrine @ 9:37 AM        Slit Lamp and Fundus Exam    External Exam      Right Left   External Normal Normal       Slit Lamp Exam      Right Left   Lids/Lashes Normal Normal    Conjunctiva/Sclera White and quiet White and quiet   Cornea Clear Clear   Anterior Chamber Deep and quiet Deep and quiet   Iris Round and reactive Round and reactive   Lens Centered posterior chamber intraocular lens, Open posterior capsule Centered posterior chamber intraocular lens, Open posterior capsule   Anterior Vitreous Normal Normal       Fundus Exam      Right Left   Posterior Vitreous  Normal   Disc  Normal   C/D Ratio  0.75   Macula  Microaneurysms, macular thickening, Severe clinically significant macular edema centered threatening but not center involved OS., Exudates,    Vessels  NPDR- Moderate   Periphery  Normal          IMAGING AND PROCEDURES  Imaging and Procedures for 07/06/20  Focal Laser - OS - Left Eye       Time Out Confirmed correct patient, procedure, site, and patient consented.   Anesthesia Topical anesthesia was used. Anesthetic medications included Proparacaine 0.5%.   Laser Information The type of laser was diode. Color was yellow. The  spot size was 150 microns. Laser power was 90. Total spots was 35.   Post-op The patient tolerated the procedure well. There were no complications. The patient received written and verbal post procedure care education.   Notes Navigated laser delivered focally temporal to the fovea left eye without complication. Excellent laser reactions noted to individual microaneurysms temporally.                ASSESSMENT/PLAN:  Severe nonproliferative diabetic retinopathy of left eye, with macular edema, associated with type 2 diabetes mellitus ([place]) Noncentral involved CSME treated today via navigated laser focally, return for follow-up in 4 months  Severe nonproliferative diabetic retinopathy of right eye, with macular edema, associated with type 2 diabetes mellitus ([place]) Good acuity, minor retinal thickening on OCT only not clinically. Will observe      ICD-10-CM   1. Severe nonproliferative diabetic  retinopathy of left eye, with macular edema, associated with type 2 diabetes mellitus ([facility])  K35.4656 Focal Laser - OS - Left Eye  2. Severe nonproliferative diabetic retinopathy of right eye, with macular edema, associated with type 2 diabetes mellitus ([place])  E11.3411     1. Focal laser delivered OS today no complications follow-up as in 4 months  2.  3.  Ophthalmic Meds Ordered this visit:  No orders of the defined types were placed in this encounter.      Return in about 4 months (around 11/03/2020) for DILATE OU, COLOR FP, OCT.  There are no Patient Instructions on file for this visit.   Explained the diagnoses, plan, and follow up with the patient and they expressed understanding.  Patient expressed understanding of the importance of proper follow up care.   Clent Demark Rankin M.D. Diseases & Surgery of the Retina and Vitreous [facility]ina [facility] Diabetic [place] 07/06/20     Abbreviations: M myopia (nearsighted); A astigmatism; H hyperopia (farsighted); P presbyopia; Mrx spectacle prescription;  CTL contact lenses; OD right eye; OS left eye; OU both eyes  XT exotropia; ET esotropia; PEK punctate epithelial keratitis; PEE punctate epithelial erosions; DES dry eye syndrome; MGD meibomian gland dysfunction; ATs artificial tears; PFAT's preservative free artificial tears; [place] nuclear sclerotic cataract; PSC posterior subcapsular cataract; ERM epi-retinal membrane; PVD posterior vitreous detachment; RD retinal detachment; DM diabetes mellitus; DR diabetic retinopathy; NPDR non-proliferative diabetic retinopathy; PDR proliferative diabetic retinopathy; CSME clinically significant macular edema; DME diabetic macular edema; dbh dot blot hemorrhages; CWS cotton wool spot; POAG primary open angle glaucoma; C/D cup-to-disc ratio; HVF humphrey visual field; GVF goldmann visual field; OCT optical coherence tomography; IOP intraocular pressure; BRVO Branch retinal vein occlusion; CRVO central retinal vein  occlusion; CRAO central retinal artery occlusion; BRAO branch retinal artery occlusion; RT retinal tear; SB scleral buckle; PPV pars plana vitrectomy; VH Vitreous hemorrhage; PRP panretinal laser photocoagulation; IVK intravitreal kenalog; VMT vitreomacular traction; MH Macular hole;  NVD neovascularization of the disc; NVE neovascularization elsewhere; AREDS age related eye disease study; ARMD age related macular degeneration; POAG primary open angle glaucoma; EBMD epithelial/anterior basement membrane dystrophy; ACIOL anterior chamber intraocular lens; IOL intraocular lens; PCIOL posterior chamber intraocular lens; Phaco/IOL phacoemulsification with intraocular lens placement; [place] photorefractive keratectomy; LASIK laser assisted in situ keratomileusis; HTN hypertension; DM diabetes mellitus; COPD chronic obstructive pulmonary disease

## 2020-07-06 NOTE — Assessment & Plan Note (Signed)
Noncentral involved CSME treated today via navigated laser focally, return for follow-up in 4 months

## 2020-07-06 NOTE — Assessment & Plan Note (Signed)
Good acuity, minor retinal thickening on OCT only not clinically. Will observe

## 2020-07-07 DIAGNOSIS — E669 Obesity, unspecified: Secondary | ICD-10-CM | POA: Diagnosis not present

## 2020-07-07 DIAGNOSIS — M79641 Pain in right hand: Secondary | ICD-10-CM | POA: Diagnosis not present

## 2020-07-07 DIAGNOSIS — M79642 Pain in left hand: Secondary | ICD-10-CM | POA: Diagnosis not present

## 2020-07-07 DIAGNOSIS — M1A09X1 Idiopathic chronic gout, multiple sites, with tophus (tophi): Secondary | ICD-10-CM | POA: Diagnosis not present

## 2020-07-07 DIAGNOSIS — E118 Type 2 diabetes mellitus with unspecified complications: Secondary | ICD-10-CM | POA: Diagnosis not present

## 2020-07-07 DIAGNOSIS — Z6837 Body mass index (BMI) 37.0-37.9, adult: Secondary | ICD-10-CM | POA: Diagnosis not present

## 2020-07-07 DIAGNOSIS — M2550 Pain in unspecified joint: Secondary | ICD-10-CM | POA: Diagnosis not present

## 2020-07-21 ENCOUNTER — Telehealth: Payer: Self-pay | Admitting: Internal Medicine

## 2020-07-21 NOTE — Telephone Encounter (Signed)
Spoken to patient and notified Dr Quin Hoop comments. Verbalized understanding. Patient have been schedule for follow up appointment on Wednesday 07/27/2020 because patient need to call and schedule for transportation.

## 2020-07-21 NOTE — Telephone Encounter (Signed)
Tried to call patient this morning and this afternoon. After the trying the 4th time, patient picked the phone. Patient stated that she noticed that her blood sugar was dropping so about 2 weeks ago, she stop taking Humalog and just continue Tresiba at 60 units in the morning after breakfast. Patient stated that she checks her blood sugar in the morning when she wakes up that is around 7 am between 9 am. She does not check blood sugar at night.  Today 69 Wednesday  60 Monday 65

## 2020-07-21 NOTE — Telephone Encounter (Signed)
Pt states her sugars have been dropping and she is only taking her new long term insulin once a day. Pt states she is not taking her Humalog.   Morning - 69 Yesterday - 60  Pt requests a call at [phone] for advice on what she should do.

## 2020-07-21 NOTE — Progress Notes (Signed)
Saw patient at [facility] this am, overheard calling MD office about low blood sugars in morning. Running int the 60's at home.  Talked with patient. She has given up meat and potatoes. Ate veggie soup last pm and this am. May not be getting enough calories. Has been eating beans and good veggies. Hasn't had the gout in a while.  Suggested she might want to add some more protein whether veg or meat.  She will f/u with MD about plans for insulin.  Checked Blood sugar in office: 108 post exercise.  She plans on continuing her exercise and better eating stating she no lower craves sugar.

## 2020-07-21 NOTE — Telephone Encounter (Signed)
Historically its always been difficult to get hold of her, I wonder if we can put a note in the chart as soon as she calls , needs to be schedule right away.  If you get hold of her , decrease tresiba to 50 units Needs a follow up as she missed her last one with me

## 2020-07-22 DIAGNOSIS — G4733 Obstructive sleep apnea (adult) (pediatric): Secondary | ICD-10-CM | POA: Diagnosis not present

## 2020-07-27 ENCOUNTER — Encounter: Payer: Self-pay | Admitting: Internal Medicine

## 2020-07-27 ENCOUNTER — Ambulatory Visit (INDEPENDENT_AMBULATORY_CARE_PROVIDER_SITE_OTHER): Payer: Medicare HMO | Admitting: Internal Medicine

## 2020-07-27 ENCOUNTER — Encounter (INDEPENDENT_AMBULATORY_CARE_PROVIDER_SITE_OTHER): Payer: Self-pay

## 2020-07-27 VITALS — BP 128/80 | HR 61 | Ht 64.0 in | Wt 219.5 lb

## 2020-07-27 DIAGNOSIS — E1142 Type 2 diabetes mellitus with diabetic polyneuropathy: Secondary | ICD-10-CM | POA: Diagnosis not present

## 2020-07-27 DIAGNOSIS — E1159 Type 2 diabetes mellitus with other circulatory complications: Secondary | ICD-10-CM | POA: Diagnosis not present

## 2020-07-27 DIAGNOSIS — Z794 Long term (current) use of insulin: Secondary | ICD-10-CM | POA: Diagnosis not present

## 2020-07-27 DIAGNOSIS — G63 Polyneuropathy in diseases classified elsewhere: Secondary | ICD-10-CM

## 2020-07-27 DIAGNOSIS — E113599 Type 2 diabetes mellitus with proliferative diabetic retinopathy without macular edema, unspecified eye: Secondary | ICD-10-CM

## 2020-07-27 LAB — POCT GLYCOSYLATED HEMOGLOBIN (HGB A1C): Hemoglobin A1C: 7.1 % — AB (ref 4.0–5.6)

## 2020-07-27 LAB — POCT GLUCOSE (DEVICE FOR HOME USE): POC Glucose: 79 mg/dl (ref 70–99)

## 2020-07-27 MED ORDER — HUMALOG KWIKPEN 200 UNIT/ML ~~LOC~~ SOPN
PEN_INJECTOR | SUBCUTANEOUS | 6 refills | Status: DC
Start: 2020-07-27 — End: 2020-10-10

## 2020-07-27 MED ORDER — TRESIBA FLEXTOUCH 100 UNIT/ML ~~LOC~~ SOPN: 50.0000 [IU] | PEN_INJECTOR | Freq: Every day | SUBCUTANEOUS | 6 refills | Status: DC

## 2020-07-27 NOTE — Patient Instructions (Addendum)
-   Keep up the Good Work ! Your A1c is 7.1 % today  - Decrease Tresiba 45 units ONCE daily  - Continue Jardiance 10 mg daily  -Humalog correctional insulin:Use the scale below to help guide you before meals   Blood sugar before meal Number of units to inject  Less than 170 0 unit  171 -  190 1 units  191 -  210 2 units  211 -  230 3 units  231 -  250 4 units  251 -  270 5 units  271 -  290 6 units  291 -  310 7 units       - Try rubbing Capsaicin cream over the feet at night to see if it helps with the pain, please make sure you wash your hands thoroughly before you touch anything else      HOW TO TREAT LOW BLOOD SUGARS (Blood sugar LESS THAN 70 MG/DL)  Please follow the RULE OF 15 for the treatment of hypoglycemia treatment (when your (blood sugars are less than 70 mg/dL)    STEP 1: Take 15 grams of carbohydrates when your blood sugar is low, which includes:   3-4 GLUCOSE TABS  OR  3-4 OZ OF JUICE OR REGULAR SODA OR  ONE TUBE OF GLUCOSE GEL     STEP 2: RECHECK blood sugar in 15 MINUTES STEP 3: If your blood sugar is still low at the 15 minute recheck --> then, go back to STEP 1 and treat AGAIN with another 15 grams of carbohydrates.

## 2020-07-27 NOTE — Progress Notes (Signed)
Name: Denise Washburn  Age/ Sex: 75 y.o., female   MRN/ DOB: [id number], 05/27/1946     PCP: Seward Carol, MD   Reason for Endocrinology Evaluation: Type 2 Diabetes Mellitus  Initial Endocrine Consultative Visit: 05/14/2019    PATIENT IDENTIFIER: Ms. Allyanna Appleman is a 75 y.o. female with a past medical history of T2DM, HTN, Dyslipidemia, CAD, Hx of Breast Ca (S/P chemo and lumpectomy)  . The patient has followed with Endocrinology clinic since 05/14/2019 for consultative assistance with management of her diabetes.  DIABETIC HISTORY:  Ms. Grahn was diagnosed with T2DM > 20 yrs ago. Has been on  Metformin in the past which caused diarrhea, has been on prandial insulin in the past  . Her hemoglobin A1c has ranged from 6.9% in 2019, peaking at 8.0% in 2020.  On her initial visit to our clinic her A1c was 11.2% . She was on Jardiance and Lantus 130 units daily. I reduced her lantus to 80 units, added prandial insulin.   Jardriance initiated  By cardiology 09/2019  SUBJECTIVE:   During the last visit (05/04/2020): A1c 8.2%. We continued jardiance and adjusted MDI regimen.     Today (07/27/2020): Ms. Carrozza is here for a follow up on diabetes due to recurrent hypoglycemia. She checks her blood sugars 2 times daily. The patient has had hypoglycemic episodes since the last clinic visit. She is symptomatic with these episodes.    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 Acute combined systolic and diastolic congestive heart failure ([place]) 02/05/2017  . 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 Acute kidney failure ([place]) 05/15/2018  . Arthritis    "all over" (05/22/2018)  . Blood dyscrasia    per pt-has small blood cells-appears as if anemic  . Breast cancer, left breast ([place]) 1985  . CHF (congestive heart failure) ([place])   . Complication of anesthesia    difficult to awaken from per pt  . Coronary artery disease   . Dyspnea   . Essential hypertension   . Heart disease   . Heart murmur   . History of gout   . Hypothyroidism   . Obesity (BMI 30-39.9) 11/11/2017  . OSA (obstructive sleep apnea) 02/18/2017    severe obstructive sleep apnea with an AHI of 75.6/h and mild central sleep apnea with a CAI of 7.7/h.  Oxygen saturations dropped as low as 82%.   He is on CPAP at 9 cm H2O.  . OSA on CPAP   . Personal history of chemotherapy   . Personal history of radiation therapy   . Presence of permanent cardiac pacemaker   . Primary localized osteoarthritis of right knee 05/13/2018  . Refusal of blood transfusions as patient is Jehovah's Witness   .  Type II diabetes mellitus ([place])    Past Surgical History:  Past Surgical History:  Procedure Laterality Date  . BREAST BIOPSY Left 1985  . BREAST LUMPECTOMY Left 1985  . BREAST LUMPECTOMY WITH NEEDLE LOCALIZATION AND AXILLARY LYMPH NODE DISSECTION  1985  . CATARACT EXTRACTION W/ INTRAOCULAR LENS  IMPLANT, BILATERAL Bilateral   . CORONARY ANGIOPLASTY WITH STENT PLACEMENT  2010, 2012,2017    2 done 2010 and 1 replaced 2012 and 2 replaced in 2017  . DILATION AND CURETTAGE OF UTERUS    . INSERT / REPLACE / REMOVE PACEMAKER  07/19/2014  . JOINT REPLACEMENT    . REPLACEMENT TOTAL KNEE Left 09/2012  . THYROIDECTOMY, PARTIAL  1978  . TONSILLECTOMY    . TOTAL KNEE ARTHROPLASTY Right 05/13/2018    Procedure: TOTAL KNEE ARTHROPLASTY;  Surgeon: Marchia Bond, MD;  Location: [facility];  Service: Orthopedics;  Laterality: Right;  . TUBAL LIGATION      Social History:  reports that she quit smoking about 39 years ago. Her smoking use included cigarettes. She has a 22.00 pack-year smoking history. She has never used smokeless tobacco. She reports current alcohol use. She reports previous drug use. Family History:  Family History  Problem Relation Age of Onset  . Multiple myeloma Mother   . Heart disease Father   . Other Sister   . Heart disease Brother   . Diabetes Sister   . Other Sister      HOME MEDICATIONS: Allergies as of 07/27/2020      Reactions   Other Other (See Comments)   Blood products- Patient won't accept any   Sulfa Antibiotics Rash   Uloric [febuxostat] Rash      Medication List       Accurate as of July 27, 2020  8:14 AM. If you have any questions, ask your nurse or doctor.        Accu-Chek Aviva device by Other route. Use as instructed   acetaminophen 500 MG tablet Commonly known as: TYLENOL Take 500-1,000 mg by mouth every 6 (six) hours as needed (for pain or headaches).   ALIVE ONCE DAILY WOMENS 50+ PO Take 1 tablet by mouth daily.   allopurinol 100 MG tablet Commonly known as: ZYLOPRIM Take 1 tablet (100 mg total) by mouth daily.   atorvastatin 80 MG tablet Commonly known as: LIPITOR Take 1 tablet (80 mg total) by mouth at bedtime.   Biotin 10 MG Caps Take 10 mg by mouth daily.   carvedilol 25 MG tablet Commonly known as: COREG Take 1 tablet (25 mg total) by mouth 2 (two) times daily with a meal.   colchicine 0.6 MG tablet TAKE 1 TABLET BY MOUTH EVERY OTHER DAY TO PREVENT FLARES   Dexcom G6 Sensor Misc 1 Device by Does not apply route as directed.   Dexcom G6 Transmitter Misc 1 Device by Does not apply route as directed.   Eliquis 5 MG Tabs tablet Generic drug: apixaban Take 1 tablet by mouth twice daily   empagliflozin 10 MG  Tabs tablet Commonly known as: Jardiance Take 1 tablet (10 mg total) by mouth daily.   ferrous sulfate 325 (65 FE) MG tablet Take 1 tablet (325 mg total) by mouth daily with breakfast.   FISH OIL PO Take 1 capsule by mouth daily.   gabapentin 300 MG capsule Commonly known as: NEURONTIN Take 300 mg by mouth daily.   glucose blood test strip Use as instructed to test blood sugars 3 times daily E11.65  Freestyle lite   HumaLOG KwikPen 200 UNIT/ML KwikPen Generic drug: insulin lispro Inject 22 Units into the skin 3 (three) times daily.   Insulin Pen Needle 32G X 4 MM Misc 1 Device by Does not apply route 4 (four) times daily.   isosorbide mononitrate 30 MG 24 hr tablet Commonly known as: IMDUR TAKE 1 TABLET BY MOUTH ONCE DAILY   levothyroxine 75 MCG tablet Commonly known as: SYNTHROID Take 75 mcg by mouth daily before breakfast.   losartan 50 MG tablet Commonly known as: COZAAR TAKE 1 TABLET BY MOUTH AT BEDTIME   nitroGLYCERIN 0.4 MG SL tablet Commonly known as: NITROSTAT DISSOLVE ONE TABLET UNDER THE TONGUE EVERY 5 MINUTES AS NEEDED FOR CHEST PAIN.   potassium chloride 10 MEQ tablet Commonly known as: KLOR-CON Take 2 tablets (20 mEq total) by mouth daily.   spironolactone 25 MG tablet Commonly known as: ALDACTONE Take 1 tablet (25 mg total) by mouth daily.   torsemide 20 MG tablet Commonly known as: DEMADEX Take 2 tablets (40 mg total) by mouth daily.   Travoprost (BAK Free) 0.004 % Soln ophthalmic solution Commonly known as: TRAVATAN Place 1 drop into both eyes at bedtime.   Tyler Aas FlexTouch 100 UNIT/ML FlexTouch Pen Generic drug: insulin degludec Inject 64 Units into the skin daily. What changed: how much to take        OBJECTIVE:   Vital Signs: BP 128/80   Pulse 61   Ht 5' 4" (1.626 m)   Wt 219 lb 8 oz (99.6 kg)   SpO2 99%   BMI 37.68 kg/m   Wt Readings from Last 3 Encounters:  07/27/20 219 lb 8 oz (99.6 kg)  07/21/20 224 lb (101.6 kg)   07/04/20 217 lb (98.4 kg)   PHYSICAL EXAM: VS: BP 128/80   Pulse 61   Ht 5' 4" (1.626 m)   Wt 219 lb 8 oz (99.6 kg)   SpO2 99%   BMI 37.68 kg/m    EXAM: General: Pt appears well and is in NAD  Heart: RRR , + systolic murmur  Lungs: CTA  Extremities:  BL LE: 1+  pretibial edema   Mental Status: Judgment, insight: intact Orientation: oriented to time, place, and person Mood and affect: no depression, anxiety, or agitation    DM Foot Exam 05/04/2020 The skin of the feet is without sores or ulcerations. The pedal pulses unable to palpate due to edema  The sensation is decreased to a screening 5.07, 10 gram monofilament bilaterally at the toes    DATA REVIEWED: Results for ANAJA, MONTS (MRN [id number]) as of 05/04/2020 08:28  Ref. Range 04/25/2020 12:28  Sodium Latest Ref Range: 135 - 145 mmol/L 141  Potassium Latest Ref Range: 3.5 - 5.1 mmol/L 4.2  Chloride Latest Ref Range: 98 - 111 mmol/L 105  CO2 Latest Ref Range: 22 - 32 mmol/L 28  Glucose Latest Ref Range: 70 - 99 mg/dL 118 (H)  Mean Plasma Glucose Latest Units: mg/dL 188.64  BUN Latest Ref Range: 8 - 23 mg/dL 29 (H)  Creatinine Latest Ref Range: 0.44 - 1.00 mg/dL 1.45 (H)  Calcium Latest Ref Range: 8.9 - 10.3 mg/dL 9.5  Anion gap Latest Ref Range: 5 - 15  8  GFR, Estimated Latest Ref Range: >60 mL/min 38 (L)      Lab Results  Component Value Date   HGBA1C 7.1 (A) 07/27/2020   HGBA1C 8.2 (H) 04/25/2020   HGBA1C 8.8 (A) 10/19/2019   Lab Results  Component Value  Date   LDLCALC 65 10/20/2019   CREATININE 1.45 (H) 04/25/2020     Lab Results  Component Value Date   CHOL 150 10/20/2019   HDL 44 10/20/2019   LDLCALC 65 10/20/2019   TRIG 206 (H) 10/20/2019   CHOLHDL 3.4 10/20/2019         ASSESSMENT / PLAN / RECOMMENDATIONS:   1) Type 2 Diabetes Mellitus, with Improved Glycemic control, With Neuropathic, CKD III and Macrovascular  complications - Most recent A1c of 7.1 %. Goal A1c < 7.5 %.    -  Glycemic control continues to improve, but she is having recurrent fasting hypoglycemia, we have reduced Tresiba dose in the past week to 50 units but this AM her fasting Bg is 79 mg/dL , will reduce further  - She does not check glucose during the day as much but she states when she checked it , its always in the 100's. I will give her a correction scale to use as needed before meals  - Unable to obtain CGM     MEDICATIONS: Decrease Tresiba to 45 units ONCE daily  She is on Jardiance 10 mg daily per cardiology CF: Humalog ( BG - 150/20)   EDUCATION / INSTRUCTIONS:  BG monitoring instructions: Patient is instructed to check her blood sugars 4 times a day, before each meal and bedtime.  Call [facility] if: BG persistently < 70 . I reviewed the Rule of 15 for the treatment of hypoglycemia in detail with the patient. Literature supplied.  2) Peripheral neuropathy :  - She is already on Gabapentin, I would like to avoid increasing this at this time due to LE edema and CKD.  - I have advised her on the past visit to use OTC Capsiacin cream but she has not picked this up yet, pt encouraged to give it a try       F/U in 4 months   Signed electronically by: Mack Guise, MD  [facility]  [facility] [address] [place], [place] [address] Phone: [phone] FAX: [phone]   CC: Seward Carol, [place] [employer] [address] [place] [address] Phone: [phone]  Fax: [phone]  Return to Endocrinology clinic as below: Future Appointments  Date Time Provider [place]  09/02/2020  7:05 AM [facility] DEVICE REMOTES [facility]  10/19/2020 10:20 AM Bensimhon, Shaune Pascal, MD [facility] None  11/03/2020 10:30 AM Rankin, Clent Demark, MD [facility] None  11/30/2020 10:10 AM Shamleffer, Melanie Crazier, MD [facility] None  12/02/2020  7:05 AM [facility] DEVICE REMOTES [facility]  03/03/2021   7:05 AM [facility] DEVICE REMOTES [facility]  06/02/2021  7:05 AM [facility] DEVICE REMOTES [facility]  09/01/2021  7:05 AM [facility] DEVICE REMOTES [facility]

## 2020-07-28 ENCOUNTER — Other Ambulatory Visit (HOSPITAL_COMMUNITY): Payer: Self-pay

## 2020-07-28 NOTE — Progress Notes (Signed)
Paramedicine Encounter    Patient ID: Danielle [name], female    DOB: 02/13/1946, 75 y.o.   MRN: [id number]   Patient Care Team: Seward Carol, MD as PCP - General (Internal Medicine) Constance Haw, MD as PCP - Electrophysiology (Cardiology) Sueanne Margarita, MD as PCP - Sleep Medicine (Cardiology) Jorge Ny, LCSW as Social Worker (Licensed Clinical Social Worker) Shamleffer, Melanie Crazier, MD as Consulting Physician (Endocrinology)  Patient Active Problem List   Diagnosis Date Noted  . Polyneuropathy associated with underlying disease ([place]) 05/04/2020  . Severe nonproliferative diabetic retinopathy of left eye, with macular edema, associated with type 2 diabetes mellitus ([place]) 11/05/2019  . Severe nonproliferative diabetic retinopathy of right eye, with macular edema, associated with type 2 diabetes mellitus ([place]) 11/05/2019  . Retinal hemorrhage of right eye 11/05/2019  . Retinal hemorrhage of left eye 11/05/2019  . Type 2 diabetes mellitus with hyperglycemia, with long-term current use of insulin ([place]) 05/14/2019  . Diabetes mellitus ([place]) 05/14/2019  . 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, with long-term current use of insulin ([place]) 05/14/2019  . Type 2 diabetes mellitus with diabetic polyneuropathy, with long-term current use of insulin ([place]) 05/14/2019  . Encephalopathy acute 05/20/2018  . 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 Acute kidney failure ([place]) 05/15/2018  . Primary localized osteoarthritis of right knee 05/13/2018  . (HFpEF) heart failure with preserved ejection fraction ([place]) 12/20/2017  . Obesity (BMI 30-39.9) 11/11/2017  . OSA (obstructive sleep apnea) 02/18/2017  . Essential hypertension   . Diabetes mellitus without complication ([place])   . Cancer ([place])   . Thyroid disease     Current Outpatient Medications:  .  acetaminophen (TYLENOL) 500 MG tablet, Take 500-1,000 mg by mouth every 6 (six) hours as  needed (for pain or headaches)., Disp: , Rfl:  .  allopurinol (ZYLOPRIM) 100 MG tablet, Take 1 tablet (100 mg total) by mouth daily., Disp: , Rfl:  .  atorvastatin (LIPITOR) 80 MG tablet, Take 1 tablet (80 mg total) by mouth at bedtime., Disp: 30 tablet, Rfl: 1 .  Biotin 10 MG CAPS, Take 10 mg by mouth daily. , Disp: , Rfl:  .  Blood Glucose Monitoring Suppl (ACCU-CHEK AVIVA) device, by Other route. Use as instructed, Disp: , Rfl:  .  carvedilol (COREG) 25 MG tablet, Take 1 tablet (25 mg total) by mouth 2 (two) times daily with a meal., Disp: 30 tablet, Rfl: 1 .  colchicine 0.6 MG tablet, TAKE 1 TABLET BY MOUTH EVERY OTHER DAY TO PREVENT FLARES, Disp: , Rfl:  .  Continuous Blood Gluc Sensor (DEXCOM G6 SENSOR) MISC, 1 Device by Does not apply route as directed. (Patient not taking: Reported on 07/27/2020), Disp: 3 each, Rfl: 11 .  Continuous Blood Gluc Transmit (DEXCOM G6 TRANSMITTER) MISC, 1 Device by Does not apply route as directed. (Patient not taking: Reported on 07/27/2020), Disp: 1 each, Rfl: 3 .  ELIQUIS 5 MG TABS tablet, Take 1 tablet by mouth twice daily, Disp: 60 tablet, Rfl: 3 .  empagliflozin (JARDIANCE) 10 MG TABS tablet, Take 1 tablet (10 mg total) by mouth daily., Disp: 30 tablet, Rfl: 1 .  ferrous sulfate 325 (65 FE) MG tablet, Take 1 tablet (325 mg total) by mouth daily with breakfast., Disp: 90 tablet, Rfl: 3 .  gabapentin (NEURONTIN) 300 MG capsule, Take 300 mg by mouth daily. , Disp: , Rfl:  .  glucose blood test strip, Use as instructed to test blood  sugars 3 times daily E11.65  Freestyle lite, Disp: 400 each, Rfl: 3 .  insulin degludec (TRESIBA FLEXTOUCH) 100 UNIT/ML FlexTouch Pen, Inject 50 Units into the skin daily., Disp: 30 mL, Rfl: 6 .  insulin lispro (HUMALOG KWIKPEN) 200 UNIT/ML KwikPen, Max daily 30 units per correction scale, Disp: 30 mL, Rfl: 6 .  Insulin Pen Needle 32G X 4 MM MISC, 1 Device by Does not apply route 4 (four) times daily., Disp: 400 each, Rfl: 3 .   isosorbide mononitrate (IMDUR) 30 MG 24 hr tablet, TAKE 1 TABLET BY MOUTH ONCE DAILY, Disp: 30 tablet, Rfl: 3 .  levothyroxine (SYNTHROID, LEVOTHROID) 75 MCG tablet, Take 75 mcg by mouth daily before breakfast., Disp: , Rfl:  .  losartan (COZAAR) 50 MG tablet, TAKE 1 TABLET BY MOUTH AT BEDTIME, Disp: 30 tablet, Rfl: 1 .  Multiple Vitamins-Minerals (ALIVE ONCE DAILY WOMENS 50+ PO), Take 1 tablet by mouth daily., Disp: , Rfl:  .  nitroGLYCERIN (NITROSTAT) 0.4 MG SL tablet, DISSOLVE ONE TABLET UNDER THE TONGUE EVERY 5 MINUTES AS NEEDED FOR CHEST PAIN., Disp: 25 tablet, Rfl: 3 .  Omega-3 Fatty Acids (FISH OIL PO), Take 1 capsule by mouth daily., Disp: , Rfl:  .  potassium chloride (KLOR-CON) 10 MEQ tablet, Take 2 tablets (20 mEq total) by mouth daily., Disp: 60 tablet, Rfl: 1 .  spironolactone (ALDACTONE) 25 MG tablet, Take 1 tablet (25 mg total) by mouth daily., Disp: 30 tablet, Rfl: 3 .  torsemide (DEMADEX) 20 MG tablet, Take 2 tablets (40 mg total) by mouth daily., Disp: 60 tablet, Rfl: 3 .  Travoprost, BAK Free, (TRAVATAN) 0.004 % SOLN ophthalmic solution, Place 1 drop into both eyes at bedtime. , Disp: , Rfl:  Allergies  Allergen Reactions  . Other Other (See Comments)    Blood products- Patient won't accept any  . Sulfa Antibiotics Rash  . Uloric [Febuxostat] Rash     Social History   Socioeconomic History  . Marital status: Widowed    Spouse name: Not on file  . Number of children: 3  . Years of education: Not on file  . Highest education level: Some college, no degree  Occupational History  . Occupation: retired  Tobacco Use  . Smoking status: Former Smoker    Packs/day: 1.00    Years: 22.00    Pack years: 22.00    Types: Cigarettes    Quit date: 1983    Years since quitting: 39.1  . Smokeless tobacco: Never Used  Vaping Use  . Vaping Use: Never used  Substance and Sexual Activity  . Alcohol use: Yes    Comment: 05/22/2018 "glass of wine couple times/wk"  . Drug use:  Not Currently  . Sexual activity: Not Currently    Birth control/protection: None, Post-menopausal  Other Topics Concern  . Not on file  Social History Narrative  . Not on file   Social Determinants of Health   Financial Resource Strain: Not on file  Food Insecurity: Not on file  Transportation Needs: Not on file  Physical Activity: Not on file  Stress: Not on file  Social Connections: Not on file  Intimate Partner Violence: Not on file    Physical Exam Vitals reviewed.  Constitutional:      Appearance: Normal appearance. She is normal weight.  HENT:     Head: Normocephalic.     Nose: Nose normal.     Mouth/Throat:     Mouth: Mucous membranes are moist.     Pharynx:  Oropharynx is clear.  Eyes:     Conjunctiva/sclera: Conjunctivae normal.     Pupils: Pupils are equal, round, and reactive to light.  Cardiovascular:     Rate and Rhythm: Normal rate and regular rhythm.     Pulses: Normal pulses.     Heart sounds: Normal heart sounds.  Pulmonary:     Effort: Pulmonary effort is normal.     Breath sounds: Normal breath sounds.  Abdominal:     General: Abdomen is flat. There is no distension.     Palpations: Abdomen is soft.  Musculoskeletal:        General: Swelling present. Normal range of motion.     Cervical back: Normal range of motion.     Right lower leg: Edema present.     Left lower leg: Edema present.  Skin:    General: Skin is warm and dry.     Capillary Refill: Capillary refill takes less than 2 seconds.  Neurological:     General: No focal deficit present.     Mental Status: She is alert. Mental status is at baseline.  Psychiatric:        Mood and Affect: Mood normal.     Arrived for home visit for [place] who was alert and oriented reporting she was feeling great. Kestrel has been active in the gym over the last several months, graduating the [employer] and continues to go twice a week. Candiss had a follow up with her Endocrinologist yesterday who  reports her A1C is down to a 7.1%. This doctor reduced her Tyler Aas too 45 units daily and moved her to a sliding scale for her HUMALOG. Levaeh was happy with this news! I obtained vitals and assessment which are as noted. Lower leg edema noted, no more than her normal. Lung sounds clear with no JVD. Abdomen soft. I reviewed medications and filled two pill boxes accordingly. Appointments reviewed. Sky agreed to visit in two weeks. Home visit complete.    Refills:  Jardiance Isosorbide OTC- multivitamin   Patient has RX for Dexcom, I will call [facility] and follow up for same.    Future Appointments  Date Time Provider [place]  09/02/2020  7:05 AM [facility] DEVICE REMOTES [facility]  10/19/2020 10:20 AM Bensimhon, Shaune Pascal, MD [facility] None  11/03/2020 10:30 AM Rankin, Clent Demark, MD [facility] None  11/30/2020 10:10 AM Shamleffer, Melanie Crazier, MD [facility] None  12/02/2020  7:05 AM [facility] DEVICE REMOTES [facility]  03/03/2021  7:05 AM [facility] DEVICE REMOTES [facility]  06/02/2021  7:05 AM [facility] DEVICE REMOTES [facility]  09/01/2021  7:05 AM [facility] DEVICE REMOTES [facility]     ACTION: Home visit completed Next visit planned for two weeks

## 2020-08-03 ENCOUNTER — Telehealth (HOSPITAL_COMMUNITY): Payer: Self-pay | Admitting: Pharmacy Technician

## 2020-08-03 NOTE — Telephone Encounter (Addendum)
Received a message from [place], (EMT) that the patient could not afford her Jardiance co-pay. It looks like she has a deductible remaining. Spoke with the patient, will have the [facility] [employer] application and a month of Jardiance samples for her to pick up while we wait for a determination.   While she is over the income for LIS, if [place] asks for the patient to apply for and be denied then the patient will call Social Security and request the LIS denial she received from 2019.  Will fax in once signatures are received.   LOT [id number] EXP 09/23

## 2020-08-05 ENCOUNTER — Ambulatory Visit: Payer: Medicare HMO | Admitting: Internal Medicine

## 2020-08-11 ENCOUNTER — Other Ambulatory Visit (HOSPITAL_COMMUNITY): Payer: Self-pay

## 2020-08-11 NOTE — Progress Notes (Signed)
Paramedicine Encounter    Patient ID: Danielle [name], female    DOB: [birth year], 75 y.o.   MRN: [id number]   Patient Care Team: Seward Carol, MD as PCP - General (Internal Medicine) Constance Haw, MD as PCP - Electrophysiology (Cardiology) Sueanne Margarita, MD as PCP - Sleep Medicine (Cardiology) Jorge Ny, LCSW as Social Worker (Licensed Clinical Social Worker) Shamleffer, Melanie Crazier, MD as Consulting Physician (Endocrinology)  Patient Active Problem List   Diagnosis Date Noted  . Polyneuropathy associated with underlying disease ([name]) 05/04/2020  . Severe nonproliferative diabetic retinopathy of left eye, with macular edema, associated with type 2 diabetes mellitus ([place]) 11/05/2019  . Severe nonproliferative diabetic retinopathy of right eye, with macular edema, associated with type 2 diabetes mellitus ([place]) 11/05/2019  . Retinal hemorrhage of right eye 11/05/2019  . Retinal hemorrhage of left eye 11/05/2019  . Type 2 diabetes mellitus with hyperglycemia, with long-term current use of insulin ([place]) 05/14/2019  . Diabetes mellitus ([place]) 05/14/2019  . 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 Type 2 diabetes mellitus with diabetic polyneuropathy, with long-term current use of insulin ([place]) 05/14/2019  . Encephalopathy acute 05/20/2018  . 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 Acute kidney failure ([place]) 05/15/2018  . Primary localized osteoarthritis of right knee 05/13/2018  . (HFpEF) heart failure with preserved ejection fraction ([place]) 12/20/2017  . Obesity (BMI 30-39.9) 11/11/2017  . OSA (obstructive sleep apnea) 02/18/2017  . Essential hypertension   . Diabetes mellitus without complication ([place])   . Cancer ([place])   . Thyroid disease     Current Outpatient Medications:  .  acetaminophen (TYLENOL) 500 MG tablet, Take 500-1,000 mg by mouth every 6 (six) hours as  needed (for pain or headaches)., Disp: , Rfl:  .  allopurinol (ZYLOPRIM) 100 MG tablet, Take 1 tablet (100 mg total) by mouth daily., Disp: , Rfl:  .  atorvastatin (LIPITOR) 80 MG tablet, Take 1 tablet (80 mg total) by mouth at bedtime., Disp: 30 tablet, Rfl: 1 .  Biotin 10 MG CAPS, Take 10 mg by mouth daily. , Disp: , Rfl:  .  Blood Glucose Monitoring Suppl (ACCU-CHEK AVIVA) device, by Other route. Use as instructed, Disp: , Rfl:  .  carvedilol (COREG) 25 MG tablet, Take 1 tablet (25 mg total) by mouth 2 (two) times daily with a meal., Disp: 30 tablet, Rfl: 1 .  colchicine 0.6 MG tablet, TAKE 1 TABLET BY MOUTH EVERY OTHER DAY TO PREVENT FLARES (Patient not taking: Reported on 07/28/2020), Disp: , Rfl:  .  Continuous Blood Gluc Sensor (DEXCOM G6 SENSOR) MISC, 1 Device by Does not apply route as directed. (Patient not taking: No sig reported), Disp: 3 each, Rfl: 11 .  Continuous Blood Gluc Transmit (DEXCOM G6 TRANSMITTER) MISC, 1 Device by Does not apply route as directed. (Patient not taking: No sig reported), Disp: 1 each, Rfl: 3 .  ELIQUIS 5 MG TABS tablet, Take 1 tablet by mouth twice daily, Disp: 60 tablet, Rfl: 3 .  empagliflozin (JARDIANCE) 10 MG TABS tablet, Take 1 tablet (10 mg total) by mouth daily., Disp: 30 tablet, Rfl: 1 .  ferrous sulfate 325 (65 FE) MG tablet, Take 1 tablet (325 mg total) by mouth daily with breakfast., Disp: 90 tablet, Rfl: 3 .  gabapentin (NEURONTIN) 300 MG capsule, Take 300 mg by mouth daily. , Disp: , Rfl:  .  glucose blood test strip,  Use as instructed to test blood sugars 3 times daily E11.65  Freestyle lite, Disp: 400 each, Rfl: 3 .  insulin degludec (TRESIBA FLEXTOUCH) 100 UNIT/ML FlexTouch Pen, Inject 50 Units into the skin daily., Disp: 30 mL, Rfl: 6 .  insulin lispro (HUMALOG KWIKPEN) 200 UNIT/ML KwikPen, Max daily 30 units per correction scale, Disp: 30 mL, Rfl: 6 .  Insulin Pen Needle 32G X 4 MM MISC, 1 Device by Does not apply route 4 (four) times daily.,  Disp: 400 each, Rfl: 3 .  isosorbide mononitrate (IMDUR) 30 MG 24 hr tablet, TAKE 1 TABLET BY MOUTH ONCE DAILY, Disp: 30 tablet, Rfl: 3 .  levothyroxine (SYNTHROID, LEVOTHROID) 75 MCG tablet, Take 75 mcg by mouth daily before breakfast., Disp: , Rfl:  .  losartan (COZAAR) 50 MG tablet, TAKE 1 TABLET BY MOUTH AT BEDTIME, Disp: 30 tablet, Rfl: 1 .  Multiple Vitamins-Minerals (ALIVE ONCE DAILY WOMENS 50+ PO), Take 1 tablet by mouth daily., Disp: , Rfl:  .  nitroGLYCERIN (NITROSTAT) 0.4 MG SL tablet, DISSOLVE ONE TABLET UNDER THE TONGUE EVERY 5 MINUTES AS NEEDED FOR CHEST PAIN. (Patient not taking: Reported on 07/28/2020), Disp: 25 tablet, Rfl: 3 .  Omega-3 Fatty Acids (FISH OIL PO), Take 1 capsule by mouth daily., Disp: , Rfl:  .  potassium chloride (KLOR-CON) 10 MEQ tablet, Take 2 tablets (20 mEq total) by mouth daily., Disp: 60 tablet, Rfl: 1 .  spironolactone (ALDACTONE) 25 MG tablet, Take 1 tablet (25 mg total) by mouth daily., Disp: 30 tablet, Rfl: 3 .  torsemide (DEMADEX) 20 MG tablet, Take 2 tablets (40 mg total) by mouth daily., Disp: 60 tablet, Rfl: 3 .  Travoprost, BAK Free, (TRAVATAN) 0.004 % SOLN ophthalmic solution, Place 1 drop into both eyes at bedtime. , Disp: , Rfl:  Allergies  Allergen Reactions  . Other Other (See Comments)    Blood products- Patient won't accept any  . Sulfa Antibiotics Rash  . Uloric [Febuxostat] Rash     Social History   Socioeconomic History  . Marital status: Widowed    Spouse name: Not on file  . Number of children: 3  . Years of education: Not on file  . Highest education level: Some college, no degree  Occupational History  . Occupation: retired  Tobacco Use  . Smoking status: Former Smoker    Packs/day: 1.00    Years: 22.00    Pack years: 22.00    Types: Cigarettes    Quit date: 1983    Years since quitting: 39.2  . Smokeless tobacco: Never Used  Vaping Use  . Vaping Use: Never used  Substance and Sexual Activity  . Alcohol use: Yes     Comment: 05/22/2018 "glass of wine couple times/wk"  . Drug use: Not Currently  . Sexual activity: Not Currently    Birth control/protection: None, Post-menopausal  Other Topics Concern  . Not on file  Social History Narrative  . Not on file   Social Determinants of Health   Financial Resource Strain: Not on file  Food Insecurity: Not on file  Transportation Needs: Not on file  Physical Activity: Not on file  Stress: Not on file  Social Connections: Not on file  Intimate Partner Violence: Not on file    Physical Exam Constitutional:      Appearance: She is normal weight.  HENT:     Head: Normocephalic.     Nose: Nose normal.     Mouth/Throat:     Mouth: Mucous membranes  are moist.     Pharynx: Oropharynx is clear.  Eyes:     Conjunctiva/sclera: Conjunctivae normal.     Pupils: Pupils are equal, round, and reactive to light.  Cardiovascular:     Rate and Rhythm: Normal rate and regular rhythm.     Pulses: Normal pulses.     Heart sounds: Normal heart sounds.  Pulmonary:     Effort: Pulmonary effort is normal.     Breath sounds: Normal breath sounds.  Abdominal:     General: Abdomen is flat.     Palpations: Abdomen is soft.  Musculoskeletal:        General: Swelling and signs of injury present. Normal range of motion.     Cervical back: Normal range of motion.     Right lower leg: No edema.     Left lower leg: No edema.     Comments: Left groin pain  Skin:    General: Skin is warm and dry.     Capillary Refill: Capillary refill takes less than 2 seconds.  Neurological:     General: No focal deficit present.     Mental Status: She is alert. Mental status is at baseline.  Psychiatric:        Mood and Affect: Mood normal.    Arrived for home visit for [place] who was alert and oriented reporting to be feeling okay but having some left groin pain moving into her upper thigh causing her pain while sitting, walking and laying down. She is currently having to use her  walker to get around which is unlike her. She has an appointment with Orthopedic tomorrow. I obtained vitals and they are as noted. CBG- 173 (without a meal today)  I reviewed medications and confirmed same filling two pill boxes for patient.   Patient was reminded of discharge plan from paramedicine when we see Dr. Haroldine Laws in May. Patient agreeable and will be able to fill her own pill box, schedule her transportation and set up appointments in which she handles now.   We reviewed appointments and confirmed same.   I plan to see Indria in two weeks as she is leaving for [place] on April 2nd and will be gone for a month. I will fill a months worth of medicines on this day.   Refills: Torsemide Isosorbide Gabapentin  Losartan Atorvastatin Allupurinol Eliquis         Future Appointments  Date Time Provider [place]  09/02/2020  7:05 AM [facility] DEVICE REMOTES [facility]  10/19/2020 10:20 AM Bensimhon, Shaune Pascal, MD [facility] None  11/03/2020 10:30 AM Rankin, Clent Demark, MD [facility] None  11/30/2020 10:10 AM Shamleffer, Melanie Crazier, MD [facility] None  12/02/2020  7:05 AM [facility] DEVICE REMOTES [facility]  03/03/2021  7:05 AM [facility] DEVICE REMOTES [facility]  06/02/2021  7:05 AM [facility] DEVICE REMOTES [facility]  09/01/2021  7:05 AM [facility] DEVICE REMOTES [facility]     ACTION: Home visit completed Next visit planned for two weeks

## 2020-08-12 DIAGNOSIS — M545 Low back pain, unspecified: Secondary | ICD-10-CM | POA: Diagnosis not present

## 2020-08-12 DIAGNOSIS — M25552 Pain in left hip: Secondary | ICD-10-CM | POA: Diagnosis not present

## 2020-08-12 NOTE — Telephone Encounter (Signed)
Sent in application via fax.  Will follow up.  

## 2020-08-19 DIAGNOSIS — I480 Paroxysmal atrial fibrillation: Secondary | ICD-10-CM | POA: Diagnosis not present

## 2020-08-19 DIAGNOSIS — E7800 Pure hypercholesterolemia, unspecified: Secondary | ICD-10-CM | POA: Diagnosis not present

## 2020-08-19 DIAGNOSIS — I5022 Chronic systolic (congestive) heart failure: Secondary | ICD-10-CM | POA: Diagnosis not present

## 2020-08-19 DIAGNOSIS — I10 Essential (primary) hypertension: Secondary | ICD-10-CM | POA: Diagnosis not present

## 2020-08-19 DIAGNOSIS — D649 Anemia, unspecified: Secondary | ICD-10-CM | POA: Diagnosis not present

## 2020-08-19 DIAGNOSIS — E113412 Type 2 diabetes mellitus with severe nonproliferative diabetic retinopathy with macular edema, left eye: Secondary | ICD-10-CM | POA: Diagnosis not present

## 2020-08-19 DIAGNOSIS — I2510 Atherosclerotic heart disease of native coronary artery without angina pectoris: Secondary | ICD-10-CM | POA: Diagnosis not present

## 2020-08-19 DIAGNOSIS — N1832 Chronic kidney disease, stage 3b: Secondary | ICD-10-CM | POA: Diagnosis not present

## 2020-08-19 DIAGNOSIS — M109 Gout, unspecified: Secondary | ICD-10-CM | POA: Diagnosis not present

## 2020-08-19 DIAGNOSIS — E1122 Type 2 diabetes mellitus with diabetic chronic kidney disease: Secondary | ICD-10-CM | POA: Diagnosis not present

## 2020-08-19 DIAGNOSIS — I739 Peripheral vascular disease, unspecified: Secondary | ICD-10-CM | POA: Diagnosis not present

## 2020-08-19 DIAGNOSIS — E039 Hypothyroidism, unspecified: Secondary | ICD-10-CM | POA: Diagnosis not present

## 2020-08-22 ENCOUNTER — Telehealth (HOSPITAL_COMMUNITY): Payer: Self-pay | Admitting: Pharmacy Technician

## 2020-08-22 NOTE — Telephone Encounter (Signed)
Advanced Heart Failure Patient Advocate Encounter   Patient was approved to receive Jardiance from [facility]  Effective dates: 08/15/20 through 05/27/21  Called and left the patient message.   [name], CPhT

## 2020-08-25 ENCOUNTER — Other Ambulatory Visit (HOSPITAL_COMMUNITY): Payer: Self-pay

## 2020-08-25 DIAGNOSIS — E7800 Pure hypercholesterolemia, unspecified: Secondary | ICD-10-CM | POA: Diagnosis not present

## 2020-08-25 DIAGNOSIS — R7989 Other specified abnormal findings of blood chemistry: Secondary | ICD-10-CM | POA: Diagnosis not present

## 2020-08-25 NOTE — Progress Notes (Signed)
Paramedicine Encounter    Patient ID: Danielle [name], female    DOB: [birth year], 75 y.o.   MRN: [id number]   Patient Care Team: Seward Carol, MD as PCP - General (Internal Medicine) Constance Haw, MD as PCP - Electrophysiology (Cardiology) Sueanne Margarita, MD as PCP - Sleep Medicine (Cardiology) Jorge Ny, LCSW as Social Worker (Licensed Clinical Social Worker) Shamleffer, Melanie Crazier, MD as Consulting Physician (Endocrinology)  Patient Active Problem List   Diagnosis Date Noted  . Polyneuropathy associated with underlying disease ([place]) 05/04/2020  . Severe nonproliferative diabetic retinopathy of left eye, with macular edema, associated with type 2 diabetes mellitus ([place]) 11/05/2019  . Severe nonproliferative diabetic retinopathy of right eye, with macular edema, associated with type 2 diabetes mellitus ([place]) 11/05/2019  . Retinal hemorrhage of right eye 11/05/2019  . Retinal hemorrhage of left eye 11/05/2019  . Type 2 diabetes mellitus with hyperglycemia, with long-term current use of insulin ([place]) 05/14/2019  . Diabetes mellitus ([place]) 05/14/2019  . 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 Type 2 diabetes mellitus with diabetic polyneuropathy, with long-term current use of insulin ([place]) 05/14/2019  . Encephalopathy acute 05/20/2018  . 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 Acute kidney failure ([place]) 05/15/2018  . Primary localized osteoarthritis of right knee 05/13/2018  . (HFpEF) heart failure with preserved ejection fraction ([place]) 12/20/2017  . Obesity (BMI 30-39.9) 11/11/2017  . OSA (obstructive sleep apnea) 02/18/2017  . Essential hypertension   . Diabetes mellitus without complication ([place])   . Cancer ([place])   . Thyroid disease     Current Outpatient Medications:  .  acetaminophen (TYLENOL) 500 MG tablet, Take 500-1,000 mg by mouth every 6 (six) hours as  needed (for pain or headaches)., Disp: , Rfl:  .  allopurinol (ZYLOPRIM) 100 MG tablet, Take 1 tablet (100 mg total) by mouth daily., Disp: , Rfl:  .  atorvastatin (LIPITOR) 80 MG tablet, Take 1 tablet (80 mg total) by mouth at bedtime., Disp: 30 tablet, Rfl: 1 .  Biotin 10 MG CAPS, Take 10 mg by mouth daily. , Disp: , Rfl:  .  Blood Glucose Monitoring Suppl (ACCU-CHEK AVIVA) device, by Other route. Use as instructed, Disp: , Rfl:  .  carvedilol (COREG) 25 MG tablet, Take 1 tablet (25 mg total) by mouth 2 (two) times daily with a meal., Disp: 30 tablet, Rfl: 1 .  colchicine 0.6 MG tablet, TAKE 1 TABLET BY MOUTH EVERY OTHER DAY TO PREVENT FLARES (Patient not taking: No sig reported), Disp: , Rfl:  .  Continuous Blood Gluc Sensor (DEXCOM G6 SENSOR) MISC, 1 Device by Does not apply route as directed. (Patient not taking: No sig reported), Disp: 3 each, Rfl: 11 .  Continuous Blood Gluc Transmit (DEXCOM G6 TRANSMITTER) MISC, 1 Device by Does not apply route as directed. (Patient not taking: No sig reported), Disp: 1 each, Rfl: 3 .  ELIQUIS 5 MG TABS tablet, Take 1 tablet by mouth twice daily, Disp: 60 tablet, Rfl: 3 .  empagliflozin (JARDIANCE) 10 MG TABS tablet, Take 1 tablet (10 mg total) by mouth daily., Disp: 30 tablet, Rfl: 1 .  ferrous sulfate 325 (65 FE) MG tablet, Take 1 tablet (325 mg total) by mouth daily with breakfast., Disp: 90 tablet, Rfl: 3 .  gabapentin (NEURONTIN) 300 MG capsule, Take 300 mg by mouth daily. , Disp: , Rfl:  .  glucose blood test strip,  Use as instructed to test blood sugars 3 times daily E11.65  Freestyle lite, Disp: 400 each, Rfl: 3 .  insulin degludec (TRESIBA FLEXTOUCH) 100 UNIT/ML FlexTouch Pen, Inject 50 Units into the skin daily., Disp: 30 mL, Rfl: 6 .  insulin lispro (HUMALOG KWIKPEN) 200 UNIT/ML KwikPen, Max daily 30 units per correction scale, Disp: 30 mL, Rfl: 6 .  Insulin Pen Needle 32G X 4 MM MISC, 1 Device by Does not apply route 4 (four) times daily., Disp:  400 each, Rfl: 3 .  isosorbide mononitrate (IMDUR) 30 MG 24 hr tablet, TAKE 1 TABLET BY MOUTH ONCE DAILY, Disp: 30 tablet, Rfl: 3 .  levothyroxine (SYNTHROID, LEVOTHROID) 75 MCG tablet, Take 75 mcg by mouth daily before breakfast., Disp: , Rfl:  .  losartan (COZAAR) 50 MG tablet, TAKE 1 TABLET BY MOUTH AT BEDTIME, Disp: 30 tablet, Rfl: 1 .  Multiple Vitamins-Minerals (ALIVE ONCE DAILY WOMENS 50+ PO), Take 1 tablet by mouth daily., Disp: , Rfl:  .  nitroGLYCERIN (NITROSTAT) 0.4 MG SL tablet, DISSOLVE ONE TABLET UNDER THE TONGUE EVERY 5 MINUTES AS NEEDED FOR CHEST PAIN. (Patient not taking: No sig reported), Disp: 25 tablet, Rfl: 3 .  Omega-3 Fatty Acids (FISH OIL PO), Take 1 capsule by mouth daily., Disp: , Rfl:  .  potassium chloride (KLOR-CON) 10 MEQ tablet, Take 2 tablets (20 mEq total) by mouth daily., Disp: 60 tablet, Rfl: 1 .  spironolactone (ALDACTONE) 25 MG tablet, Take 1 tablet (25 mg total) by mouth daily., Disp: 30 tablet, Rfl: 3 .  torsemide (DEMADEX) 20 MG tablet, Take 2 tablets (40 mg total) by mouth daily., Disp: 60 tablet, Rfl: 3 .  Travoprost, BAK Free, (TRAVATAN) 0.004 % SOLN ophthalmic solution, Place 1 drop into both eyes at bedtime. , Disp: , Rfl:  Allergies  Allergen Reactions  . Other Other (See Comments)    Blood products- Patient won't accept any  . Sulfa Antibiotics Rash  . Uloric [Febuxostat] Rash     Social History   Socioeconomic History  . Marital status: Widowed    Spouse name: Not on file  . Number of children: 3  . Years of education: Not on file  . Highest education level: Some college, no degree  Occupational History  . Occupation: retired  Tobacco Use  . Smoking status: Former Smoker    Packs/day: 1.00    Years: 22.00    Pack years: 22.00    Types: Cigarettes    Quit date: 1983    Years since quitting: 39.2  . Smokeless tobacco: Never Used  Vaping Use  . Vaping Use: Never used  Substance and Sexual Activity  . Alcohol use: Yes    Comment:  05/22/2018 "glass of wine couple times/wk"  . Drug use: Not Currently  . Sexual activity: Not Currently    Birth control/protection: None, Post-menopausal  Other Topics Concern  . Not on file  Social History Narrative  . Not on file   Social Determinants of Health   Financial Resource Strain: Not on file  Food Insecurity: Not on file  Transportation Needs: Not on file  Physical Activity: Not on file  Stress: Not on file  Social Connections: Not on file  Intimate Partner Violence: Not on file    Physical Exam Vitals reviewed.  Constitutional:      Appearance: She is normal weight.  HENT:     Head: Normocephalic.     Nose: Nose normal.     Mouth/Throat:  Mouth: Mucous membranes are moist.     Pharynx: Oropharynx is clear.  Eyes:     Conjunctiva/sclera: Conjunctivae normal.     Pupils: Pupils are equal, round, and reactive to light.  Cardiovascular:     Rate and Rhythm: Normal rate and regular rhythm.     Pulses: Normal pulses.     Heart sounds: Normal heart sounds.  Pulmonary:     Effort: Pulmonary effort is normal.     Breath sounds: Normal breath sounds.  Abdominal:     General: Abdomen is flat.     Palpations: Abdomen is soft.  Musculoskeletal:        General: Swelling present. Normal range of motion.     Cervical back: Normal range of motion.     Right lower leg: Edema present.     Left lower leg: Edema present.  Skin:    General: Skin is warm and dry.     Capillary Refill: Capillary refill takes less than 2 seconds.  Neurological:     General: No focal deficit present.     Mental Status: She is alert. Mental status is at baseline.  Psychiatric:        Mood and Affect: Mood normal.     Arrived for home visit for [place] who was alert and oriented reporting she was feeling good but only having some back pain which has been bothering her for a few weeks now. Mehar has been compliant with her medicines over the last two weeks. I reviewed medications and  filled two pill boxes for Mrs. [place] was reminded of upcoming appointments and required no assistance with transportation. Tessla and I discussed discharge plan from paramedicine and in two weeks I will bring out medication list and provider list for Chrisie to be successful at discharge. Dejia agreed with plan. Assessment and vitals are as noted. Dashonna has some swelling in her legs but no more than normal. Hazelynn agreed to visit in two weeks, she reports she is going to [place] for the next two weeks. Home visit complete. I will see Sharolyn in two weeks.   Refills: Allupurinol Atorvastatin Gabapentin Isosorbide Losartan     Future Appointments  Date Time Provider [place]  09/02/2020  7:05 AM [facility]  10/07/2020  9:45 AM Marzetta Board, DPM [facility]  10/19/2020 10:20 AM Bensimhon, Shaune Pascal, MD [facility] None  11/03/2020 10:30 AM Rankin, Clent Demark, MD [facility] None  11/30/2020 10:10 AM Shamleffer, Melanie Crazier, MD [facility] None  12/02/2020  7:05 AM [facility] DEVICE REMOTES [facility]  03/03/2021  7:05 AM [facility] DEVICE REMOTES [facility]  06/02/2021  7:05 AM [facility] DEVICE REMOTES [facility]  09/01/2021  7:05 AM [facility] DEVICE REMOTES [facility]     ACTION: Home visit completed Next visit planned for two weeks

## 2020-09-02 ENCOUNTER — Other Ambulatory Visit: Payer: Self-pay

## 2020-09-02 ENCOUNTER — Other Ambulatory Visit (HOSPITAL_COMMUNITY): Payer: Self-pay | Admitting: *Deleted

## 2020-09-02 MED ORDER — ATORVASTATIN CALCIUM 80 MG PO TABS: 80.0000 mg | ORAL_TABLET | Freq: Every day | ORAL | 1 refills | Status: DC

## 2020-09-02 MED ORDER — ISOSORBIDE MONONITRATE ER 30 MG PO TB24: ORAL_TABLET | ORAL | 3 refills | Status: DC

## 2020-09-02 MED ORDER — LOSARTAN POTASSIUM 50 MG PO TABS: ORAL_TABLET | ORAL | 1 refills | Status: DC

## 2020-09-08 ENCOUNTER — Telehealth (HOSPITAL_COMMUNITY): Payer: Self-pay

## 2020-09-08 NOTE — Telephone Encounter (Signed)
Spoke to [place] who reports she will be in [place] until May, she reports having all her medicines with no complaints. Call complete.

## 2020-09-28 ENCOUNTER — Telehealth (HOSPITAL_COMMUNITY): Payer: Self-pay

## 2020-09-28 NOTE — Telephone Encounter (Signed)
Spoke to [place] who reports she will be home May 11th. I will follow up when she gets home. Sallie reports she has all medications, and has been feeling well. Call complete.

## 2020-10-07 ENCOUNTER — Ambulatory Visit: Payer: Medicare HMO | Admitting: Podiatry

## 2020-10-10 ENCOUNTER — Other Ambulatory Visit: Payer: Self-pay

## 2020-10-10 ENCOUNTER — Encounter: Payer: Self-pay | Admitting: Podiatry

## 2020-10-10 ENCOUNTER — Ambulatory Visit: Payer: Medicare HMO | Admitting: Podiatry

## 2020-10-10 DIAGNOSIS — M2042 Other hammer toe(s) (acquired), left foot: Secondary | ICD-10-CM

## 2020-10-10 DIAGNOSIS — E119 Type 2 diabetes mellitus without complications: Secondary | ICD-10-CM | POA: Diagnosis not present

## 2020-10-10 DIAGNOSIS — Z0189 Encounter for other specified special examinations: Secondary | ICD-10-CM

## 2020-10-10 DIAGNOSIS — L84 Corns and callosities: Secondary | ICD-10-CM

## 2020-10-10 DIAGNOSIS — M79674 Pain in right toe(s): Secondary | ICD-10-CM

## 2020-10-10 DIAGNOSIS — M2041 Other hammer toe(s) (acquired), right foot: Secondary | ICD-10-CM | POA: Diagnosis not present

## 2020-10-10 DIAGNOSIS — B351 Tinea unguium: Secondary | ICD-10-CM | POA: Diagnosis not present

## 2020-10-10 DIAGNOSIS — E1151 Type 2 diabetes mellitus with diabetic peripheral angiopathy without gangrene: Secondary | ICD-10-CM

## 2020-10-10 DIAGNOSIS — S91112A Laceration without foreign body of left great toe without damage to nail, initial encounter: Secondary | ICD-10-CM

## 2020-10-10 DIAGNOSIS — M79675 Pain in left toe(s): Secondary | ICD-10-CM

## 2020-10-10 MED ORDER — MUPIROCIN 2 % EX OINT: TOPICAL_OINTMENT | CUTANEOUS | 1 refills | Status: DC

## 2020-10-10 NOTE — Patient Instructions (Signed)
DRESSING CHANGES [place]:    1. APPLY A LIGHT AMOUNT OF Mupirocin Ointment LEFT GREAT TOE.  2. APPLY FABRIC BAND-AID  3.  IF YOU EXPERIENCE ANY FEVER, CHILLS, NIGHTSWEATS, NAUSEA OR VOMITING, ELEVATED OR LOW BLOOD SUGARS, REPORT TO EMERGENCY ROOM.  4. IF YOU EXPERIENCE INCREASED REDNESS, PAIN, SWELLING, DISCOLORATION, ODOR, PUS, DRAINAGE OR WARMTH OF YOUR FOOT, REPORT TO EMERGENCY ROOM.

## 2020-10-12 DIAGNOSIS — R7989 Other specified abnormal findings of blood chemistry: Secondary | ICD-10-CM | POA: Diagnosis not present

## 2020-10-13 ENCOUNTER — Other Ambulatory Visit (HOSPITAL_COMMUNITY): Payer: Self-pay | Admitting: *Deleted

## 2020-10-13 ENCOUNTER — Other Ambulatory Visit (HOSPITAL_COMMUNITY): Payer: Self-pay

## 2020-10-13 MED ORDER — TORSEMIDE 20 MG PO TABS: 40.0000 mg | ORAL_TABLET | Freq: Every day | ORAL | 3 refills | Status: DC

## 2020-10-13 MED ORDER — POTASSIUM CHLORIDE CRYS ER 10 MEQ PO TBCR: 20.0000 meq | EXTENDED_RELEASE_TABLET | Freq: Every day | ORAL | 1 refills | Status: DC

## 2020-10-13 NOTE — Progress Notes (Signed)
Paramedicine Encounter    Patient ID: Danielle [name], female    DOB: [birth year], 75 y.o.   MRN: [id number]   Patient Care Team: Seward Carol, MD as PCP - General (Internal Medicine) Constance Haw, MD as PCP - Electrophysiology (Cardiology) Sueanne Margarita, MD as PCP - Sleep Medicine (Cardiology) Jorge Ny, LCSW as Social Worker (Licensed Clinical Social Worker) Shamleffer, Melanie Crazier, MD as Consulting Physician (Endocrinology)  Patient Active Problem List   Diagnosis Date Noted  . Polyneuropathy associated with underlying disease ([place]) 05/04/2020  . Severe nonproliferative diabetic retinopathy of left eye, with macular edema, associated with type 2 diabetes mellitus ([place]) 11/05/2019  . Severe nonproliferative diabetic retinopathy of right eye, with macular edema, associated with type 2 diabetes mellitus ([place]) 11/05/2019  . Retinal hemorrhage of right eye 11/05/2019  . Retinal hemorrhage of left eye 11/05/2019  . Type 2 diabetes mellitus with hyperglycemia, with long-term current use of insulin ([place]) 05/14/2019  . Diabetes mellitus ([place]) 05/14/2019  . 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, with long-term current use of insulin ([place]) 05/14/2019  . Type 2 diabetes mellitus with diabetic polyneuropathy, with long-term current use of insulin ([place]) 05/14/2019  . Encephalopathy acute 05/20/2018  . 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 Acute kidney failure ([place]) 05/15/2018  . Primary localized osteoarthritis of right knee 05/13/2018  . (HFpEF) heart failure with preserved ejection fraction ([place]) 12/20/2017  . Obesity (BMI 30-39.9) 11/11/2017  . OSA (obstructive sleep apnea) 02/18/2017  . Essential hypertension   . Diabetes mellitus without complication ([place])   . Cancer ([place])   . Thyroid disease     Current Outpatient Medications:  .  acetaminophen (TYLENOL) 500 MG tablet, Take 500-1,000 mg by mouth every 6 (six) hours as  needed (for pain or headaches)., Disp: , Rfl:  .  Alcohol Swabs PADS, See admin instructions., Disp: , Rfl:  .  allopurinol (ZYLOPRIM) 100 MG tablet, 1 tablet, Disp: , Rfl:  .  atorvastatin (LIPITOR) 80 MG tablet, Take 1 tablet (80 mg total) by mouth at bedtime., Disp: 90 tablet, Rfl: 1 .  Biotin 10 MG TABS, 1 tablet, Disp: , Rfl:  .  Blood Glucose Monitoring Suppl (FREESTYLE LITE) DEVI, See admin instructions., Disp: , Rfl:  .  carvedilol (COREG) 25 MG tablet, 1 tablet with food, Disp: , Rfl:  .  clotrimazole-betamethasone (LOTRISONE) lotion, Apply a small amount twice daily to affected area(s) for 14 days, then use as needed for itch., Disp: , Rfl:  .  colchicine 0.6 MG tablet, TAKE 1 TABLET BY MOUTH EVERY OTHER DAY TO PREVENT FLARES (Patient not taking: No sig reported), Disp: , Rfl:  .  Continuous Blood Gluc Sensor (DEXCOM G6 SENSOR) MISC, 1 Device by Does not apply route as directed. (Patient not taking: No sig reported), Disp: 3 each, Rfl: 11 .  Continuous Blood Gluc Transmit (DEXCOM G6 TRANSMITTER) MISC, 1 Device by Does not apply route as directed. (Patient not taking: No sig reported), Disp: 1 each, Rfl: 3 .  ELIQUIS 5 MG TABS tablet, Take 1 tablet by mouth twice daily, Disp: 60 tablet, Rfl: 3 .  empagliflozin (JARDIANCE) 10 MG TABS tablet, 1 tablet, Disp: , Rfl:  .  Ferrous Sulfate (IRON) 325 (65 Fe) MG TABS, Take 1 tablet by mouth daily., Disp: , Rfl:  .  gabapentin (NEURONTIN) 300 MG capsule, 1 capsule, Disp: , Rfl:  .  glucose blood (FREESTYLE LITE) test strip, See admin instructions., Disp: ,  Rfl:  .  insulin degludec (TRESIBA FLEXTOUCH) 100 UNIT/ML FlexTouch Pen, Inject 50 Units into the skin daily., Disp: 30 mL, Rfl: 6 .  insulin lispro (HUMALOG) 100 UNIT/ML injection, 26 units, Disp: , Rfl:  .  isosorbide mononitrate (IMDUR) 30 MG 24 hr tablet, TAKE 1 TABLET BY MOUTH ONCE DAILY, Disp: 30 tablet, Rfl: 3 .  latanoprost (XALATAN) 0.005 % ophthalmic solution, 1 drop into affected eye  in the evening, Disp: , Rfl:  .  levothyroxine (SYNTHROID, LEVOTHROID) 75 MCG tablet, Take 75 mcg by mouth daily before breakfast., Disp: , Rfl:  .  losartan (COZAAR) 50 MG tablet, TAKE 1 TABLET BY MOUTH AT BEDTIME, Disp: 30 tablet, Rfl: 1 .  LUMIGAN 0.01 % SOLN, , Disp: , Rfl:  .  methylPREDNISolone (MEDROL DOSEPAK) 4 MG TBPK tablet, TAKE 6 TABLETS ON DAY 1 THEN TAKE 5 TABLETS ON DAY 2 THEN TAKE 4 TABLETS ON DAY 3 THEN TAKE 3 TABLETS ON DAY 4 THEN TAKE 2 TABLETS ON DAY 5 THEN TAKE 1 TABLET ON DAY 6. TAKE ALL DOSES WITH FOOD., Disp: , Rfl:  .  Multiple Vitamins-Minerals (ALIVE ONCE DAILY WOMENS 50+ PO), Take 1 tablet by mouth daily., Disp: , Rfl:  .  mupirocin ointment (BACTROBAN) 2 %, Apply to left great toe once daily for 14 days., Disp: 30 g, Rfl: 1 .  nitroGLYCERIN (NITROSTAT) 0.4 MG SL tablet, DISSOLVE ONE TABLET UNDER THE TONGUE EVERY 5 MINUTES AS NEEDED FOR CHEST PAIN. (Patient not taking: No sig reported), Disp: 25 tablet, Rfl: 3 .  Omega-3 Fatty Acids (FISH OIL PO), Take 1 capsule by mouth daily., Disp: , Rfl:  .  omeprazole (PRILOSEC) 20 MG capsule, 1 capsule, Disp: , Rfl:  .  potassium chloride (KLOR-CON) 10 MEQ tablet, Take 2 tablets (20 mEq total) by mouth daily., Disp: 60 tablet, Rfl: 1 .  potassium chloride (KLOR-CON) 10 MEQ tablet, Take 20 mEq by mouth daily., Disp: , Rfl:  .  RELION PEN NEEDLE 31G/8MM 31G X 8 MM MISC, USE 1 THREE TIMES DAILY AS DIRECTED, Disp: , Rfl:  .  spironolactone (ALDACTONE) 25 MG tablet, Take 1 tablet (25 mg total) by mouth daily., Disp: 30 tablet, Rfl: 3 .  torsemide (DEMADEX) 20 MG tablet, Take 2 tablets (40 mg total) by mouth daily., Disp: 60 tablet, Rfl: 3 .  traMADol (ULTRAM) 50 MG tablet, 1 tablet as needed, Disp: , Rfl:  .  Travoprost, BAK Free, (TRAVATAN) 0.004 % SOLN ophthalmic solution, Place 1 drop into both eyes at bedtime. , Disp: , Rfl:  Allergies  Allergen Reactions  . Other Other (See Comments)    Blood products- Patient won't accept  any Other reaction(s): rash  . Sulfa Antibiotics Rash  . Uloric [Febuxostat] Rash     Social History   Socioeconomic History  . Marital status: Widowed    Spouse name: Not on file  . Number of children: 3  . Years of education: Not on file  . Highest education level: Some college, no degree  Occupational History  . Occupation: retired  Tobacco Use  . Smoking status: Former Smoker    Packs/day: 1.00    Years: 22.00    Pack years: 22.00    Types: Cigarettes    Quit date: 1983    Years since quitting: 39.4  . Smokeless tobacco: Never Used  Vaping Use  . Vaping Use: Never used  Substance and Sexual Activity  . Alcohol use: Yes    Comment: 05/22/2018 "glass of  wine couple times/wk"  . Drug use: Not Currently  . Sexual activity: Not Currently    Birth control/protection: None, Post-menopausal  Other Topics Concern  . Not on file  Social History Narrative  . Not on file   Social Determinants of Health   Financial Resource Strain: Not on file  Food Insecurity: Not on file  Transportation Needs: Not on file  Physical Activity: Not on file  Stress: Not on file  Social Connections: Not on file  Intimate Partner Violence: Not on file    Physical Exam Vitals reviewed.  Constitutional:      Appearance: Normal appearance. She is normal weight.  HENT:     Head: Normocephalic.     Nose: Nose normal.     Mouth/Throat:     Mouth: Mucous membranes are moist.     Pharynx: Oropharynx is clear.  Eyes:     Conjunctiva/sclera: Conjunctivae normal.     Pupils: Pupils are equal, round, and reactive to light.  Cardiovascular:     Rate and Rhythm: Normal rate and regular rhythm.     Pulses: Normal pulses.     Heart sounds: Normal heart sounds.  Pulmonary:     Effort: Pulmonary effort is normal.     Breath sounds: Normal breath sounds.  Abdominal:     General: Abdomen is flat.     Palpations: Abdomen is soft.  Musculoskeletal:        General: No swelling. Normal range of  motion.     Cervical back: Normal range of motion.     Right lower leg: No edema.     Left lower leg: No edema.  Skin:    General: Skin is warm and dry.     Capillary Refill: Capillary refill takes less than 2 seconds.  Neurological:     General: No focal deficit present.     Mental Status: She is alert. Mental status is at baseline.  Psychiatric:        Mood and Affect: Mood normal.     Arrived for home visit for [place] who was alert and oriented reporting to be feeling good today. Danielle [name] denied any recent bouts of chest pain, dizziness, shortness of breath or trouble taking her medications or walking. Danielle [name] has been compliant with her medications while she was on vacation. Today I obtained vitals and they are as noted. No edema noted in lower legs, lungs clear with no JVD. Medications reviewed and confirmed. Pill box for one week filled accordingly. Danielle [name] reports right shoulder pain from her booster shot of Pfizer vaccine on 4/13. She said her shoulder has been hurting since the shot and has not gotten better. Danielle [name] does have full ROM in that arm. Danielle [name] and I reviewed upcoming appointments and plan to meet in one week at the HF clinic for her appointment with Dr. Haroldine Laws. Home visit complete.   Refills: Allupurinol Eliquis  Danielle [name] Torsemide Levothyroxine Potassium    CBG- 180 (fasting)    Future Appointments  Date Time Provider [place]  10/19/2020 10:20 AM Bensimhon, Shaune Pascal, MD [facility] None  11/03/2020 10:30 AM Rankin, Clent Demark, MD [facility] None  11/30/2020 10:10 AM Shamleffer, Melanie Crazier, MD [facility] None  12/02/2020  7:05 AM [facility] DEVICE REMOTES [facility]  03/03/2021  7:05 AM [facility] DEVICE REMOTES [facility]  06/02/2021  7:05 AM [facility] DEVICE REMOTES [facility]  09/01/2021  7:05 AM [facility] DEVICE REMOTES [facility]     ACTION: Home visit completed Next visit planned  for one  week

## 2020-10-15 NOTE — Progress Notes (Signed)
ANNUAL DIABETIC FOOT EXAM  Subjective: Danielle [name] presents today for for annual diabetic foot examination and painful thick toenails that are difficult to trim. Pain interferes with ambulation. Aggravating factors include wearing enclosed shoe gear. Pain is relieved with periodic professional debridement.   She states she attempted to cut her toenails and cut her left great toe too short. She has kept it clean. Denies any redness, swelling or drainage.  Patient relates >20 year h/o diabetes.  Patient denies any  h/o foot wounds.  Patient relates symptoms of foot numbness in LLE.  Patient did not check blood glucose this morning. She hasn't checked blood gluxose due to losing her glucometer after a trip to [place].  She has h/o right ankle fracture with ORIF about 20 years ago.  Danielle Carol, MD is patient's PCP. Last visit was March 2022.  Past Medical History:  Diagnosis Date  . Acute combined systolic and diastolic congestive heart failure ([place]) 02/05/2017  . 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 Acute kidney failure ([place]) 05/15/2018  . Arthritis    "all over" (05/22/2018)  . Blood dyscrasia    per pt-has small blood cells-appears as if anemic  . Breast cancer, left breast ([place]) 1985  . CHF (congestive heart failure) ([place])   . Complication of anesthesia    difficult to awaken from per pt  . Coronary artery disease   . Dyspnea   . Essential hypertension   . Heart disease   . Heart murmur   . History of gout   . Hypothyroidism   . Obesity (BMI 30-39.9) 11/11/2017  . OSA (obstructive sleep apnea) 02/18/2017    severe obstructive sleep apnea with an AHI of 75.6/h and mild central sleep apnea with a CAI of 7.7/h.  Oxygen saturations dropped as low as 82%.   He is on CPAP at 9 cm H2O.  . OSA on CPAP   . Personal history of chemotherapy   . Personal history of radiation therapy   . Presence of permanent cardiac pacemaker   . Primary localized  osteoarthritis of right knee 05/13/2018  . Refusal of blood transfusions as patient is Jehovah's Witness   . Type II diabetes mellitus [facility])    Patient Active Problem List   Diagnosis Date Noted  . Polyneuropathy associated with underlying disease ([place]) 05/04/2020  . Severe nonproliferative diabetic retinopathy of left eye, with macular edema, associated with type 2 diabetes mellitus ([place]) 11/05/2019  . Severe nonproliferative diabetic retinopathy of right eye, with macular edema, associated with type 2 diabetes mellitus ([place]) 11/05/2019  . Retinal hemorrhage of right eye 11/05/2019  . Retinal hemorrhage of left eye 11/05/2019  . Type 2 diabetes mellitus with hyperglycemia, with long-term current use of insulin ([place]) 05/14/2019  . Diabetes mellitus ([place]) 05/14/2019  . 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, with long-term current use of insulin ([place]) 05/14/2019  . Type 2 diabetes mellitus with diabetic polyneuropathy, with long-term current use of insulin ([place]) 05/14/2019  . Encephalopathy acute 05/20/2018  . 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 Acute kidney failure ([place]) 05/15/2018  . Primary localized osteoarthritis of right knee 05/13/2018  . (HFpEF) heart failure with preserved ejection fraction ([place]) 12/20/2017  . Obesity (BMI 30-39.9) 11/11/2017  . OSA (obstructive sleep apnea) 02/18/2017  . Essential hypertension   . Diabetes mellitus without complication ([place])   . Cancer ([place])   . Thyroid disease    Past Surgical  History:  Procedure Laterality Date  . BREAST BIOPSY Left 1985  . BREAST LUMPECTOMY Left 1985  . BREAST LUMPECTOMY WITH NEEDLE LOCALIZATION AND AXILLARY LYMPH NODE DISSECTION  1985  . CATARACT EXTRACTION W/ INTRAOCULAR LENS  IMPLANT, BILATERAL Bilateral   . CORONARY ANGIOPLASTY WITH STENT PLACEMENT  2010, 2012,2017    2 done 2010 and 1 replaced 2012 and 2 replaced in 2017  . DILATION AND CURETTAGE OF UTERUS    .  INSERT / REPLACE / REMOVE PACEMAKER  07/19/2014  . JOINT REPLACEMENT    . REPLACEMENT TOTAL KNEE Left 09/2012  . THYROIDECTOMY, PARTIAL  1978  . TONSILLECTOMY    . TOTAL KNEE ARTHROPLASTY Right 05/13/2018   Procedure: TOTAL KNEE ARTHROPLASTY;  Surgeon: Marchia Bond, MD;  Location: [facility];  Service: Orthopedics;  Laterality: Right;  . TUBAL LIGATION     Current Outpatient Medications on File Prior to Visit  Medication Sig Dispense Refill  . Alcohol Swabs PADS See admin instructions.    . clotrimazole-betamethasone (LOTRISONE) lotion Apply a small amount twice daily to affected area(s) for 14 days, then use as needed for itch.    . traMADol (ULTRAM) 50 MG tablet 1 tablet as needed    . acetaminophen (TYLENOL) 500 MG tablet Take 500-1,000 mg by mouth every 6 (six) hours as needed (for pain or headaches).    Marland Kitchen allopurinol (ZYLOPRIM) 100 MG tablet 1 tablet    . atorvastatin (LIPITOR) 80 MG tablet Take 1 tablet (80 mg total) by mouth at bedtime. 90 tablet 1  . Biotin 10 MG TABS 1 tablet    . Blood Glucose Monitoring Suppl (FREESTYLE LITE) DEVI See admin instructions.    . carvedilol (COREG) 25 MG tablet 1 tablet with food    . colchicine 0.6 MG tablet TAKE 1 TABLET BY MOUTH EVERY OTHER DAY TO PREVENT FLARES (Patient not taking: No sig reported)    . Continuous Blood Gluc Sensor (DEXCOM G6 SENSOR) MISC 1 Device by Does not apply route as directed. (Patient not taking: No sig reported) 3 each 11  . Continuous Blood Gluc Transmit (DEXCOM G6 TRANSMITTER) MISC 1 Device by Does not apply route as directed. (Patient not taking: No sig reported) 1 each 3  . ELIQUIS 5 MG TABS tablet Take 1 tablet by mouth twice daily 60 tablet 3  . empagliflozin (JARDIANCE) 10 MG TABS tablet 1 tablet    . Ferrous Sulfate (IRON) 325 (65 Fe) MG TABS Take 1 tablet by mouth daily.    Marland Kitchen gabapentin (NEURONTIN) 300 MG capsule 1 capsule    . glucose blood (FREESTYLE LITE) test strip See admin instructions.    . insulin  degludec (TRESIBA FLEXTOUCH) 100 UNIT/ML FlexTouch Pen Inject 50 Units into the skin daily. 30 mL 6  . insulin lispro (HUMALOG) 100 UNIT/ML injection 26 units    . isosorbide mononitrate (IMDUR) 30 MG 24 hr tablet TAKE 1 TABLET BY MOUTH ONCE DAILY 30 tablet 3  . latanoprost (XALATAN) 0.005 % ophthalmic solution 1 drop into affected eye in the evening    . levothyroxine (SYNTHROID, LEVOTHROID) 75 MCG tablet Take 75 mcg by mouth daily before breakfast.    . losartan (COZAAR) 50 MG tablet TAKE 1 TABLET BY MOUTH AT BEDTIME 30 tablet 1  . LUMIGAN 0.01 % SOLN     . methylPREDNISolone (MEDROL DOSEPAK) 4 MG TBPK tablet TAKE 6 TABLETS ON DAY 1 THEN TAKE 5 TABLETS ON DAY 2 THEN TAKE 4 TABLETS ON DAY 3 THEN TAKE 3  TABLETS ON DAY 4 THEN TAKE 2 TABLETS ON DAY 5 THEN TAKE 1 TABLET ON DAY 6. TAKE ALL DOSES WITH FOOD. (Patient not taking: Reported on 10/13/2020)    . Multiple Vitamins-Minerals (ALIVE ONCE DAILY WOMENS 50+ PO) Take 1 tablet by mouth daily.    . nitroGLYCERIN (NITROSTAT) 0.4 MG SL tablet DISSOLVE ONE TABLET UNDER THE TONGUE EVERY 5 MINUTES AS NEEDED FOR CHEST PAIN. 25 tablet 3  . Omega-3 Fatty Acids (FISH OIL PO) Take 1 capsule by mouth daily.    Marland Kitchen omeprazole (PRILOSEC) 20 MG capsule 1 capsule    . potassium chloride (KLOR-CON) 10 MEQ tablet Take 20 mEq by mouth daily.    Marland Kitchen RELION PEN NEEDLE 31G/8MM 31G X 8 MM MISC USE 1 THREE TIMES DAILY AS DIRECTED    . spironolactone (ALDACTONE) 25 MG tablet Take 1 tablet (25 mg total) by mouth daily. 30 tablet 3  . Travoprost, BAK Free, (TRAVATAN) 0.004 % SOLN ophthalmic solution Place 1 drop into both eyes at bedtime.      No current facility-administered medications on file prior to visit.    Allergies  Allergen Reactions  . Other Other (See Comments)    Blood products- Patient won't accept any Other reaction(s): rash  . Sulfa Antibiotics Rash  . Uloric [Febuxostat] Rash   Social History   Occupational History  . Occupation: retired  Tobacco Use   . Smoking status: Former Smoker    Packs/day: 1.00    Years: 22.00    Pack years: 22.00    Types: Cigarettes    Quit date: 1983    Years since quitting: 39.4  . Smokeless tobacco: Never Used  Vaping Use  . Vaping Use: Never used  Substance and Sexual Activity  . Alcohol use: Yes    Comment: 05/22/2018 "glass of wine couple times/wk"  . Drug use: Not Currently  . Sexual activity: Not Currently    Birth control/protection: None, Post-menopausal   Family History  Problem Relation Age of Onset  . Multiple myeloma Mother   . Heart disease Father   . Other Sister   . Heart disease Brother   . Diabetes Sister   . Other Sister    Immunization History  Administered Date(s) Administered  . Influenza Split 03/10/2013, 03/22/2015  . Influenza, High Dose Seasonal PF 04/01/2014, 03/30/2016     Review of Systems: Negative except as noted in the HPI.  Objective: There were no vitals filed for this visit.  Camreigh Michie is a pleasant 75 y.o. female in NAD. AAO X 3.  Vascular Examination: Capillary refill time to digits <4 seconds b/l lower extremities. Faintly palpable DP pulse(s) b/l lower extremities. Nonpalpable PT pulse(s) b/l lower extremities. Pedal hair absent. Lower extremity skin temperature gradient within normal limits. No pain with calf compression b/l. +2 pitting edema b/l lower extremities.  Dermatological Examination: Pedal skin with normal turgor, texture and tone bilaterally. Evidence of self-inflicted laceration of L hallux. There is dried heme. No erythema, no edema, no drainage, no fluctuance. No interdigital macerations bilaterally. Toenails 2-5 bilaterally and R hallux elongated, discolored, dystrophic, thickened, and crumbly with subungual debris and tenderness to dorsal palpation. Hyperkeratotic lesion(s) L 2nd toe and submet head 5 right foot.  No erythema, no edema, no drainage, no fluctuance.  Musculoskeletal Examination: Normal muscle strength 5/5 to all  lower extremity muscle groups bilaterally. No pain crepitus or joint limitation noted with ROM b/l. Hammertoe(s) noted to the L 2nd toe. Pes planus deformity noted b/l.  Footwear Assessment: Does the patient wear appropriate shoes? Yes. Does the patient need inserts/orthotics? Yes.  Neurological Examination: Protective sensation decreased with 10 gram monofilament b/l. Vibratory sensation diminished b/l.  Hemoglobin A1C Latest Ref Rng & Units 07/27/2020 04/25/2020 10/19/2019  HGBA1C 4.0 - 5.6 % 7.1(A) 8.2(H) 8.8(A)  Some recent data might be hidden   Assessment: 1. Pain due to onychomycosis of toenails of both feet   2. Corns and callosities   3. Laceration of left great toe without foreign body present or damage to nail, initial encounter   4. Acquired hammertoes of both feet   5. Type II diabetes mellitus with peripheral circulatory disorder (HCC)   6. Encounter for diabetic foot exam ([place])    ADA Risk Categorization:  High Risk  Patient has one or more of the following: Loss of protective sensation Absent pedal pulses Severe Foot deformity History of foot ulcer  Plan: -Examined patient. -Diabetic foot examination performed on today's visit. -Patient to continue soft, supportive shoe gear daily. -Patient to continue soft, supportive shoe gear daily. Start procedure for diabetic shoes. Patient qualifies based on diagnoses. -Toenails 2-5 b/l and right hallux were debrided in length and girth with sterile nail nippers and dremel without iatrogenic bleeding.  -Corn(s) L 2nd toe and callus(es) submet head 5 right foot were pared utilizing sterile scalpel blade without incident. Total number debrided =2. -Patient to report any pedal injuries to medical professional immediately. -Prescription written for Mupirocin Ointment. Patient is to apply to left hallux once daily and cover with dressing.  -Patient/POA to call should there be question/concern in the interim.  Return in about 3  months (around 01/10/2021).  Marzetta Board, DPM

## 2020-10-19 ENCOUNTER — Encounter (HOSPITAL_COMMUNITY): Payer: Self-pay | Admitting: Internal Medicine

## 2020-10-19 ENCOUNTER — Other Ambulatory Visit (HOSPITAL_COMMUNITY): Payer: Self-pay

## 2020-10-19 ENCOUNTER — Ambulatory Visit (HOSPITAL_COMMUNITY)
Admission: RE | Admit: 2020-10-19 | Discharge: 2020-10-19 | Disposition: A | Discharge status: Home / Self Care | Payer: Medicare HMO | Source: Ambulatory Visit | Attending: Internal Medicine | Admitting: Internal Medicine

## 2020-10-19 ENCOUNTER — Other Ambulatory Visit: Payer: Self-pay

## 2020-10-19 VITALS — BP 104/70 | HR 72

## 2020-10-19 DIAGNOSIS — E1122 Type 2 diabetes mellitus with diabetic chronic kidney disease: Secondary | ICD-10-CM | POA: Insufficient documentation

## 2020-10-19 DIAGNOSIS — Z8249 Family history of ischemic heart disease and other diseases of the circulatory system: Secondary | ICD-10-CM | POA: Diagnosis not present

## 2020-10-19 DIAGNOSIS — N1832 Chronic kidney disease, stage 3b: Secondary | ICD-10-CM | POA: Diagnosis not present

## 2020-10-19 DIAGNOSIS — I5032 Chronic diastolic (congestive) heart failure: Secondary | ICD-10-CM

## 2020-10-19 DIAGNOSIS — Z87891 Personal history of nicotine dependence: Secondary | ICD-10-CM | POA: Diagnosis not present

## 2020-10-19 DIAGNOSIS — E669 Obesity, unspecified: Secondary | ICD-10-CM

## 2020-10-19 DIAGNOSIS — I480 Paroxysmal atrial fibrillation: Secondary | ICD-10-CM | POA: Diagnosis not present

## 2020-10-19 DIAGNOSIS — E119 Type 2 diabetes mellitus without complications: Secondary | ICD-10-CM | POA: Diagnosis not present

## 2020-10-19 DIAGNOSIS — I2510 Atherosclerotic heart disease of native coronary artery without angina pectoris: Secondary | ICD-10-CM | POA: Insufficient documentation

## 2020-10-19 DIAGNOSIS — Z79899 Other long term (current) drug therapy: Secondary | ICD-10-CM | POA: Insufficient documentation

## 2020-10-19 DIAGNOSIS — Z950 Presence of cardiac pacemaker: Secondary | ICD-10-CM | POA: Diagnosis not present

## 2020-10-19 DIAGNOSIS — Z7901 Long term (current) use of anticoagulants: Secondary | ICD-10-CM | POA: Insufficient documentation

## 2020-10-19 DIAGNOSIS — Z794 Long term (current) use of insulin: Secondary | ICD-10-CM | POA: Diagnosis not present

## 2020-10-19 DIAGNOSIS — Z882 Allergy status to sulfonamides status: Secondary | ICD-10-CM | POA: Insufficient documentation

## 2020-10-19 DIAGNOSIS — I130 Hypertensive heart and chronic kidney disease with heart failure and stage 1 through stage 4 chronic kidney disease, or unspecified chronic kidney disease: Secondary | ICD-10-CM | POA: Diagnosis not present

## 2020-10-19 DIAGNOSIS — Z833 Family history of diabetes mellitus: Secondary | ICD-10-CM | POA: Insufficient documentation

## 2020-10-19 DIAGNOSIS — I255 Ischemic cardiomyopathy: Secondary | ICD-10-CM | POA: Diagnosis not present

## 2020-10-19 DIAGNOSIS — Z955 Presence of coronary angioplasty implant and graft: Secondary | ICD-10-CM | POA: Insufficient documentation

## 2020-10-19 DIAGNOSIS — G4733 Obstructive sleep apnea (adult) (pediatric): Secondary | ICD-10-CM | POA: Diagnosis not present

## 2020-10-19 MED ORDER — ISOSORBIDE MONONITRATE ER 30 MG PO TB24: ORAL_TABLET | ORAL | 11 refills | Status: DC

## 2020-10-19 MED ORDER — ELIQUIS 5 MG PO TABS
1.0000 | ORAL_TABLET | Freq: Two times a day (BID) | ORAL | 11 refills | Status: DC
Start: 2020-10-19 — End: 2021-11-23

## 2020-10-19 MED ORDER — SPIRONOLACTONE 25 MG PO TABS: 25.0000 mg | ORAL_TABLET | Freq: Every day | ORAL | 11 refills | Status: DC

## 2020-10-19 MED ORDER — CARVEDILOL 25 MG PO TABS: ORAL_TABLET | ORAL | 11 refills | Status: DC

## 2020-10-19 MED ORDER — LOSARTAN POTASSIUM 50 MG PO TABS: ORAL_TABLET | ORAL | 11 refills | Status: DC

## 2020-10-19 MED ORDER — TORSEMIDE 20 MG PO TABS: 40.0000 mg | ORAL_TABLET | Freq: Every day | ORAL | 11 refills | Status: DC

## 2020-10-19 NOTE — Patient Instructions (Addendum)
No Labs done today.   INCREASE Torsemide to 80mg  (4 tablets) by mouth daily for 2 days THEN DECREASE back down to 40mg  (2 tablets) by mouth daily.  No other medication changes were made. Please continue all current medications as prescribed.  Your physician recommends that you schedule a follow-up appointment in: 6 months with our [facility] here in our office. Please contact our office in October to schedule a November appointment   If you have any questions or concerns before your next appointment please send [place] a message through [place] or call our office at [phone].    TO LEAVE A MESSAGE FOR THE NURSE SELECT OPTION 2, PLEASE LEAVE A MESSAGE INCLUDING: . YOUR NAME . DATE OF BIRTH . CALL BACK NUMBER . REASON FOR CALL**this is important as we prioritize the call backs  YOU WILL RECEIVE A CALL BACK THE SAME DAY AS LONG AS YOU CALL BEFORE 4:00 PM   Do the following things EVERYDAY: 1) Weigh yourself in the morning before breakfast. Write it down and keep it in a log. 2) Take your medicines as prescribed 3) Eat low salt foods--Limit salt (sodium) to 2000 mg per day.  4) Stay as active as you can everyday 5) Limit all fluids for the day to less than 2 liters   At the [facility], you and your health needs are our priority. As part of our continuing mission to provide you with exceptional heart care, we have created designated Provider Care Teams. These Care Teams include your primary Cardiologist (physician) and Advanced Practice Providers (APPs- Physician Assistants and Nurse Practitioners) who all work together to provide you with the care you need, when you need it.   You may see any of the following providers on your designated Care Team at your next follow up: Marland Kitchen Dr Glori Bickers . Dr Loralie Champagne . Darrick Grinder, NP . Lyda Jester, PA . Audry Riles, PharmD   Please be sure to bring in all your medications bottles to every appointment.

## 2020-10-19 NOTE — Progress Notes (Signed)
Advanced Heart Failure Clinic Note    Primary Care: Dr. Delfina Redwood Primary Cardiologist: Dr. Haroldine Laws  EP: Dr Tomasa Blase Rheumatology: Dr Posey Rea  HPI:  Alexee Delsanto is a 75 y.o. female with a past medical history of systolicHF (EF 59-16%), CAD s/p multiple stents in [place] (with h/o LAD stent thrombosis), ischemic cardiomyopathy (EF 10% at one point), symptomatic bradycardia s/p PPM, HTN, DM, hypothyroidism, and OSA.  Admitted 9/18 with SOB, acute on chronic systolic CHF. Diuresed with IV lasix (12 pounds), discharge weight was 215 pounds.   Had R TKR 12/19.   Today she returns for HF follow up. Continues to follow with Paramedicine. [name] is here with her today. Feels good. Just got back from 1 month in [place]. Was very active. Denies SOB, orthopnea or PND. Mild LE edema. Taking all meds as prescribed. No dizziness.   Echo 12/21 EF 45-50% G2DD  ECHO 01/2017- EF 45-50% Grade 2DD Left Atrium Severely dilated.  ECHO 12/2017 EF 45-50%, G2DD, LA mildly dilated, trileaflet moderately thick aortic valve  Review of systems complete and found to be negative unless listed in HPI.    Past Medical History:  Diagnosis Date  . Acute combined systolic and diastolic congestive heart failure ([place]) 02/05/2017  . 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 Acute kidney failure ([place]) 05/15/2018  . Arthritis    "all over" (05/22/2018)  . Blood dyscrasia    per pt-has small blood cells-appears as if anemic  . Breast cancer, left breast ([place]) 1985  . CHF (congestive heart failure) ([place])   . Complication of anesthesia    difficult to awaken from per pt  . Coronary artery disease   . Dyspnea   . Essential hypertension   . Heart disease   . Heart murmur   . History of gout   . Hypothyroidism   . Obesity (BMI 30-39.9) 11/11/2017  . OSA (obstructive sleep apnea) 02/18/2017    severe obstructive sleep apnea with an AHI of 75.6/h and mild central sleep apnea with a CAI of 7.7/h.   Oxygen saturations dropped as low as 82%.   He is on CPAP at 9 cm H2O.  . OSA on CPAP   . Personal history of chemotherapy   . Personal history of radiation therapy   . Presence of permanent cardiac pacemaker   . Primary localized osteoarthritis of right knee 05/13/2018  . Refusal of blood transfusions as patient is Jehovah's Witness   . Type II diabetes mellitus ([place])     Current Outpatient Medications  Medication Sig Dispense Refill  . acetaminophen (TYLENOL) 500 MG tablet Take 500-1,000 mg by mouth every 6 (six) hours as needed (for pain or headaches).    . Alcohol Swabs PADS See admin instructions.    Marland Kitchen allopurinol (ZYLOPRIM) 100 MG tablet 1 tablet    . atorvastatin (LIPITOR) 80 MG tablet Take 1 tablet (80 mg total) by mouth at bedtime. 90 tablet 1  . Biotin 10 MG TABS 1 tablet    . carvedilol (COREG) 25 MG tablet 1 tablet with food    . ELIQUIS 5 MG TABS tablet Take 1 tablet by mouth twice daily 60 tablet 3  . empagliflozin (JARDIANCE) 10 MG TABS tablet 1 tablet    . empagliflozin (JARDIANCE) 10 MG TABS tablet Take 10 mg by mouth daily.    . Ferrous Sulfate (IRON) 325 (65 Fe) MG TABS Take 1 tablet by mouth daily.    Marland Kitchen gabapentin (NEURONTIN) 300 MG capsule  Take 300 mg by mouth 2 (two) times daily.    . insulin degludec (TRESIBA FLEXTOUCH) 100 UNIT/ML FlexTouch Pen Inject 50 Units into the skin daily. 30 mL 6  . insulin lispro (HUMALOG) 100 UNIT/ML injection 26 units    . isosorbide mononitrate (IMDUR) 30 MG 24 hr tablet TAKE 1 TABLET BY MOUTH ONCE DAILY 30 tablet 3  . latanoprost (XALATAN) 0.005 % ophthalmic solution 1 drop into affected eye in the evening    . levothyroxine (SYNTHROID, LEVOTHROID) 75 MCG tablet Take 75 mcg by mouth daily before breakfast.    . losartan (COZAAR) 50 MG tablet TAKE 1 TABLET BY MOUTH AT BEDTIME 30 tablet 1  . LUMIGAN 0.01 % SOLN     . Multiple Vitamins-Minerals (ALIVE ONCE DAILY WOMENS 50+ PO) Take 1 tablet by mouth daily.    . mupirocin ointment  (BACTROBAN) 2 % Apply to left great toe once daily for 14 days. 30 g 1  . nitroGLYCERIN (NITROSTAT) 0.4 MG SL tablet DISSOLVE ONE TABLET UNDER THE TONGUE EVERY 5 MINUTES AS NEEDED FOR CHEST PAIN. 25 tablet 3  . Omega-3 Fatty Acids (FISH OIL PO) Take 1 capsule by mouth daily.    . potassium chloride (KLOR-CON) 10 MEQ tablet Take 20 mEq by mouth daily.    . potassium chloride (KLOR-CON) 10 MEQ tablet Take 2 tablets (20 mEq total) by mouth daily. 60 tablet 1  . RELION PEN NEEDLE 31G/8MM 31G X 8 MM MISC USE 1 THREE TIMES DAILY AS DIRECTED    . spironolactone (ALDACTONE) 25 MG tablet Take 1 tablet (25 mg total) by mouth daily. 30 tablet 3  . torsemide (DEMADEX) 20 MG tablet Take 2 tablets (40 mg total) by mouth daily. 60 tablet 3  . Travoprost, BAK Free, (TRAVATAN) 0.004 % SOLN ophthalmic solution Place 1 drop into both eyes at bedtime.     . clotrimazole-betamethasone (LOTRISONE) lotion Apply a small amount twice daily to affected area(s) for 14 days, then use as needed for itch.     No current facility-administered medications for this encounter.   Allergies  Allergen Reactions  . Other Other (See Comments)    Blood products- Patient won't accept any Other reaction(s): rash  . Sulfa Antibiotics Rash  . Uloric [Febuxostat] Rash   Social History   Socioeconomic History  . Marital status: Widowed    Spouse name: Not on file  . Number of children: 3  . Years of education: Not on file  . Highest education level: Some college, no degree  Occupational History  . Occupation: retired  Tobacco Use  . Smoking status: Former Smoker    Packs/day: 1.00    Years: 22.00    Pack years: 22.00    Types: Cigarettes    Quit date: 1983    Years since quitting: 39.4  . Smokeless tobacco: Never Used  Vaping Use  . Vaping Use: Never used  Substance and Sexual Activity  . Alcohol use: Yes    Comment: 05/22/2018 "glass of wine couple times/wk"  . Drug use: Not Currently  . Sexual activity: Not  Currently    Birth control/protection: None, Post-menopausal  Other Topics Concern  . Not on file  Social History Narrative  . Not on file   Social Determinants of Health   Financial Resource Strain: Not on file  Food Insecurity: Not on file  Transportation Needs: Not on file  Physical Activity: Not on file  Stress: Not on file  Social Connections: Not on file  Intimate Partner Violence: Not on file   Family History  Problem Relation Age of Onset  . Multiple myeloma Mother   . Heart disease Father   . Other Sister   . Heart disease Brother   . Diabetes Sister   . Other Sister    Vitals:   10/19/20 1012  BP: 104/70  Pulse: 72  SpO2: 95%     Wt Readings from Last 3 Encounters:  10/13/20 99.3 kg (219 lb)  08/25/20 98.4 kg (217 lb)  08/11/20 98.4 kg (217 lb)    PHYSICAL EXAM: General:  Well appearing. No resp difficulty HEENT: normal Neck: supple. JVP 10. Carotids 2+ bilat; no bruits. No lymphadenopathy or thryomegaly appreciated. Cor: PMI nondisplaced. Regular rate & rhythm. No rubs, gallops or murmurs. Lungs: clear Abdomen: obese soft, nontender, nondistended. No hepatosplenomegaly. No bruits or masses. Good bowel sounds. Extremities: no cyanosis, clubbing, rash, 2+  edema Neuro: alert & orientedx3, cranial nerves grossly intact. moves all 4 extremities w/o difficulty. Affect pleasant   ASSESSMENT & PLAN:  1. Chronic systolic CHF: ICM - Echo 98/26 EF 45-50% G2DD  - Stable NYHA II. Volume status mildly elevated  - Continue  torsemide 40 daily.  Will double for 2 days with extra K. Need to watch diet more closely  - Continue imdur 30 mg daily - Continue losartan 50 mg daily. - Continue spiro 25 mg daily.  - Continue carvedilol 25 mg twice a day. - Continue Jardiance 10 mg daily.  - Recent labs with Dr. Delfina Redwood ok   2. CAD: Stenting in 2010, 2012 and 2016.  - No ss/s ischemia - Continue statin. No ASA with Eliquis.  3. Paroxysmal Afib - Regular on exam -  Continue Eliquis 5 mg BID. No bleeding  - Following in Afib clinic.   4. HTN - Blood pressure well controlled. Continue current regimen. - If goes back up can switch losartan to Entresto   5. DM - Per Dr. Delfina Redwood.  - Continue Jardiance.   6. H/o sinus pauses s/p PPM - Dr. Curt Bears now following. No change.   7. OSA-  - Continue CPAP nightly.  - Follows with Dr Radford Pax  8. CKD 3b - SCr ~ 1.4 - continue Jardiance - recent BMET ok   Glori Bickers, MD 10/19/20

## 2020-10-19 NOTE — Addendum Note (Signed)
Encounter addended by: Shonna Chock, CMA on: 09/30/1831 10:44 AM  Actions taken: Pharmacy for encounter modified, Order list changed

## 2020-10-19 NOTE — Addendum Note (Signed)
Encounter addended by: Shonna Chock, CMA on: 1/59/4585 10:41 AM  Actions taken: Clinical Note Signed

## 2020-10-20 NOTE — Progress Notes (Signed)
Paramedicine Encounter    Patient ID: Danielle [name], female    DOB: [birth year], 75 y.o.   MRN: [id number]   Met with Danielle [name] in clinic today where she saw Dr. Haroldine Laws. Dr Haroldine Laws noted some fluid rentention and increased her Torsemide 39m BID with 20Meq of Potassium BID for two days then back to 471mDaily.   Dr. BeHaroldine Lawsequests we continue paramedicine until she is more stable. She will have a 6 month follow up in the clinic and weekly to bi-weekly visits from me.   I filled pill box accordingly including changes. [name].   Refills: Allupurinol Levothyroxine Torsemide   Weight at home- 219lbs Weight in clinic- 228lbs    ACTION: Home visit completed Next visit planned for one week

## 2020-10-21 DIAGNOSIS — M25511 Pain in right shoulder: Secondary | ICD-10-CM | POA: Diagnosis not present

## 2020-10-21 DIAGNOSIS — T8484XA Pain due to internal orthopedic prosthetic devices, implants and grafts, initial encounter: Secondary | ICD-10-CM | POA: Diagnosis not present

## 2020-10-26 ENCOUNTER — Encounter (INDEPENDENT_AMBULATORY_CARE_PROVIDER_SITE_OTHER): Payer: Self-pay

## 2020-10-26 ENCOUNTER — Other Ambulatory Visit (HOSPITAL_COMMUNITY): Payer: Self-pay

## 2020-10-26 DIAGNOSIS — H401112 Primary open-angle glaucoma, right eye, moderate stage: Secondary | ICD-10-CM | POA: Diagnosis not present

## 2020-10-26 DIAGNOSIS — H401123 Primary open-angle glaucoma, left eye, severe stage: Secondary | ICD-10-CM | POA: Diagnosis not present

## 2020-10-26 DIAGNOSIS — E1139 Type 2 diabetes mellitus with other diabetic ophthalmic complication: Secondary | ICD-10-CM | POA: Diagnosis not present

## 2020-10-26 DIAGNOSIS — E113291 Type 2 diabetes mellitus with mild nonproliferative diabetic retinopathy without macular edema, right eye: Secondary | ICD-10-CM | POA: Diagnosis not present

## 2020-10-26 DIAGNOSIS — Z961 Presence of intraocular lens: Secondary | ICD-10-CM | POA: Diagnosis not present

## 2020-10-26 DIAGNOSIS — E113212 Type 2 diabetes mellitus with mild nonproliferative diabetic retinopathy with macular edema, left eye: Secondary | ICD-10-CM | POA: Diagnosis not present

## 2020-10-26 DIAGNOSIS — Z794 Long term (current) use of insulin: Secondary | ICD-10-CM | POA: Diagnosis not present

## 2020-10-26 DIAGNOSIS — H16223 Keratoconjunctivitis sicca, not specified as Sjogren's, bilateral: Secondary | ICD-10-CM | POA: Diagnosis not present

## 2020-10-26 NOTE — Progress Notes (Signed)
Paramedicine Encounter    Patient ID: Danielle [name], female    DOB: [birth year], 75 y.o.   MRN: [id number]   Patient Care Team: Seward Carol, MD as PCP - General (Internal Medicine) Constance Haw, MD as PCP - Electrophysiology (Cardiology) Sueanne Margarita, MD as PCP - Sleep Medicine (Cardiology) Jorge Ny, LCSW as Social Worker (Licensed Clinical Social Worker) Shamleffer, Melanie Crazier, MD as Consulting Physician (Endocrinology)  Patient Active Problem List   Diagnosis Date Noted  . Polyneuropathy associated with underlying disease ([place]) 05/04/2020  . Severe nonproliferative diabetic retinopathy of left eye, with macular edema, associated with type 2 diabetes mellitus ([place]) 11/05/2019  . Severe nonproliferative diabetic retinopathy of right eye, with macular edema, associated with type 2 diabetes mellitus ([place]) 11/05/2019  . Retinal hemorrhage of right eye 11/05/2019  . Retinal hemorrhage of left eye 11/05/2019  . Type 2 diabetes mellitus with hyperglycemia, with long-term current use of insulin ([place]) 05/14/2019  . Diabetes mellitus ([place]) 05/14/2019  . 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, with long-term current use of insulin ([place]) 05/14/2019  . Type 2 diabetes mellitus with diabetic polyneuropathy, with long-term current use of insulin ([place]) 05/14/2019  . Encephalopathy acute 05/20/2018  . 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 Acute kidney failure ([place]) 05/15/2018  . Primary localized osteoarthritis of right knee 05/13/2018  . (HFpEF) heart failure with preserved ejection fraction ([place]) 12/20/2017  . Obesity (BMI 30-39.9) 11/11/2017  . OSA (obstructive sleep apnea) 02/18/2017  . Essential hypertension   . Diabetes mellitus without complication ([place])   . Cancer ([place])   . Thyroid disease     Current Outpatient Medications:  .  acetaminophen (TYLENOL) 500 MG tablet, Take 500-1,000 mg by mouth every 6 (six) hours as  needed (for pain or headaches)., Disp: , Rfl:  .  Alcohol Swabs PADS, See admin instructions., Disp: , Rfl:  .  allopurinol (ZYLOPRIM) 100 MG tablet, 1 tablet, Disp: , Rfl:  .  apixaban (ELIQUIS) 5 MG TABS tablet, Take 1 tablet (5 mg total) by mouth 2 (two) times daily., Disp: 60 tablet, Rfl: 11 .  atorvastatin (LIPITOR) 80 MG tablet, Take 1 tablet (80 mg total) by mouth at bedtime., Disp: 90 tablet, Rfl: 1 .  Biotin 10 MG TABS, 1 tablet, Disp: , Rfl:  .  carvedilol (COREG) 25 MG tablet, 1 tablet by mouth 2 times daily with food, Disp: 60 tablet, Rfl: 11 .  clotrimazole-betamethasone (LOTRISONE) lotion, Apply a small amount twice daily to affected area(s) for 14 days, then use as needed for itch., Disp: , Rfl:  .  empagliflozin (JARDIANCE) 10 MG TABS tablet, 1 tablet, Disp: , Rfl:  .  empagliflozin (JARDIANCE) 10 MG TABS tablet, Take 10 mg by mouth daily., Disp: , Rfl:  .  Ferrous Sulfate (IRON) 325 (65 Fe) MG TABS, Take 1 tablet by mouth daily., Disp: , Rfl:  .  gabapentin (NEURONTIN) 300 MG capsule, Take 300 mg by mouth 2 (two) times daily., Disp: , Rfl:  .  insulin degludec (TRESIBA FLEXTOUCH) 100 UNIT/ML FlexTouch Pen, Inject 50 Units into the skin daily., Disp: 30 mL, Rfl: 6 .  insulin lispro (HUMALOG) 100 UNIT/ML injection, 26 units, Disp: , Rfl:  .  isosorbide mononitrate (IMDUR) 30 MG 24 hr tablet, TAKE 1 TABLET BY MOUTH ONCE DAILY, Disp: 30 tablet, Rfl: 11 .  latanoprost (XALATAN) 0.005 % ophthalmic solution, 1 drop into affected eye in the evening, Disp: , Rfl:  .  levothyroxine (SYNTHROID, LEVOTHROID) 75 MCG tablet, Take 75 mcg by mouth daily before breakfast., Disp: , Rfl:  .  losartan (COZAAR) 50 MG tablet, TAKE 1 TABLET BY MOUTH AT BEDTIME, Disp: 30 tablet, Rfl: 11 .  LUMIGAN 0.01 % SOLN, , Disp: , Rfl:  .  Multiple Vitamins-Minerals (ALIVE ONCE DAILY WOMENS 50+ PO), Take 1 tablet by mouth daily., Disp: , Rfl:  .  mupirocin ointment (BACTROBAN) 2 %, Apply to left great toe once  daily for 14 days., Disp: 30 g, Rfl: 1 .  nitroGLYCERIN (NITROSTAT) 0.4 MG SL tablet, DISSOLVE ONE TABLET UNDER THE TONGUE EVERY 5 MINUTES AS NEEDED FOR CHEST PAIN., Disp: 25 tablet, Rfl: 3 .  Omega-3 Fatty Acids (FISH OIL PO), Take 1 capsule by mouth daily., Disp: , Rfl:  .  potassium chloride (KLOR-CON) 10 MEQ tablet, Take 20 mEq by mouth daily., Disp: , Rfl:  .  potassium chloride (KLOR-CON) 10 MEQ tablet, Take 2 tablets (20 mEq total) by mouth daily., Disp: 60 tablet, Rfl: 1 .  RELION PEN NEEDLE 31G/8MM 31G X 8 MM MISC, USE 1 THREE TIMES DAILY AS DIRECTED, Disp: , Rfl:  .  spironolactone (ALDACTONE) 25 MG tablet, Take 1 tablet (25 mg total) by mouth daily., Disp: 30 tablet, Rfl: 11 .  torsemide (DEMADEX) 20 MG tablet, Take 2 tablets (40 mg total) by mouth daily., Disp: 60 tablet, Rfl: 11 .  Travoprost, BAK Free, (TRAVATAN) 0.004 % SOLN ophthalmic solution, Place 1 drop into both eyes at bedtime. , Disp: , Rfl:  Allergies  Allergen Reactions  . Other Other (See Comments)    Blood products- Patient won't accept any Other reaction(s): rash  . Sulfa Antibiotics Rash  . Uloric [Febuxostat] Rash     Social History   Socioeconomic History  . Marital status: Widowed    Spouse name: Not on file  . Number of children: 3  . Years of education: Not on file  . Highest education level: Some college, no degree  Occupational History  . Occupation: retired  Tobacco Use  . Smoking status: Former Smoker    Packs/day: 1.00    Years: 22.00    Pack years: 22.00    Types: Cigarettes    Quit date: 1983    Years since quitting: 39.4  . Smokeless tobacco: Never Used  Vaping Use  . Vaping Use: Never used  Substance and Sexual Activity  . Alcohol use: Yes    Comment: 05/22/2018 "glass of wine couple times/wk"  . Drug use: Not Currently  . Sexual activity: Not Currently    Birth control/protection: None, Post-menopausal  Other Topics Concern  . Not on file  Social History Narrative  . Not on  file   Social Determinants of Health   Financial Resource Strain: Not on file  Food Insecurity: Not on file  Transportation Needs: Not on file  Physical Activity: Not on file  Stress: Not on file  Social Connections: Not on file  Intimate Partner Violence: Not on file    Physical Exam Vitals reviewed.  Constitutional:      Appearance: Normal appearance. She is normal weight.  HENT:     Head: Normocephalic.     Nose: Nose normal.     Mouth/Throat:     Mouth: Mucous membranes are moist.     Pharynx: Oropharynx is clear.  Eyes:     Conjunctiva/sclera: Conjunctivae normal.     Pupils: Pupils are equal, round, and reactive to light.  Cardiovascular:  Rate and Rhythm: Normal rate and regular rhythm.     Pulses: Normal pulses.     Heart sounds: Normal heart sounds.  Pulmonary:     Effort: Pulmonary effort is normal.     Breath sounds: Normal breath sounds.  Abdominal:     General: Abdomen is flat.  Musculoskeletal:        General: Normal range of motion.     Cervical back: Normal range of motion.     Right lower leg: No edema.     Left lower leg: No edema.  Skin:    General: Skin is warm and dry.     Capillary Refill: Capillary refill takes less than 2 seconds.  Neurological:     General: No focal deficit present.     Mental Status: She is alert. Mental status is at baseline.  Psychiatric:        Mood and Affect: Mood normal.        Behavior: Behavior normal.    Arrived for home visit for Danielle [name] who was alert and oriented reporting to be feeling "off". Danielle [name] complains she has been feeling more tired and sleepy over the last few days. I reviewed medications with Danielle [name] and it appears she missed a few doses of her medications as well as being without her Humalog for several days due to lack of her calling it into pharmacy. During our visit I called in missing medications and confirmed pick up for tomorrow by the patient. She agreed with plan and knows to  re-start her humalog daily with meals. Pill box filled accordingly. Vitals and assessment as noted. CBG noted to be elevated, educated Danielle [name] on diabetic management and risks. He verbalized understanding. I reviewed appointments with Danielle [name] and home visit complete. I will be back out in one week.   Refills: Allupurinol Humalog  CBG- 262     Future Appointments  Date Time Provider [place]  11/03/2020 10:30 AM Rankin, Clent Demark, MD [facility] None  11/30/2020 10:10 AM Shamleffer, Melanie Crazier, MD [facility] None  12/02/2020  7:05 AM [facility] DEVICE REMOTES [facility]  03/03/2021  7:05 AM [facility] DEVICE REMOTES [facility]  06/02/2021  7:05 AM [facility] DEVICE REMOTES [facility]  09/01/2021  7:05 AM [facility] DEVICE REMOTES [facility]     ACTION: Home visit completed Next visit planned for one week

## 2020-11-03 ENCOUNTER — Encounter (INDEPENDENT_AMBULATORY_CARE_PROVIDER_SITE_OTHER): Payer: Self-pay | Admitting: Ophthalmology

## 2020-11-03 ENCOUNTER — Ambulatory Visit (INDEPENDENT_AMBULATORY_CARE_PROVIDER_SITE_OTHER): Payer: Medicare HMO | Admitting: Ophthalmology

## 2020-11-03 ENCOUNTER — Other Ambulatory Visit: Payer: Self-pay

## 2020-11-03 DIAGNOSIS — E113412 Type 2 diabetes mellitus with severe nonproliferative diabetic retinopathy with macular edema, left eye: Secondary | ICD-10-CM

## 2020-11-03 DIAGNOSIS — E113411 Type 2 diabetes mellitus with severe nonproliferative diabetic retinopathy with macular edema, right eye: Secondary | ICD-10-CM

## 2020-11-03 NOTE — Assessment & Plan Note (Signed)
Circinate ring continues 4 months later, yet less CSME by OCT.  We will continue to monitor and observe

## 2020-11-03 NOTE — Progress Notes (Signed)
11/03/2020     CHIEF COMPLAINT Patient presents for Retina Follow Up (4 month fu ou and oct/fp/Pt states, "My OS seems to be off some. I feel like I am having more floaters too. Dr. Katy Fitch said I have not had a significant change but I think it is more blurry." Denies any FOL or curtains and veils /Pt reports using Latanoprost QHS OU/A1C: 7.0/LBS: 219)   HISTORY OF PRESENT ILLNESS: Danielle [name] is a 75 y.o. female who presents to the clinic today for:   HPI     Retina Follow Up           Diagnosis: Diabetic Retinopathy   Laterality: both eyes   Onset: 4 months ago   Severity: mild   Duration: 4 weeks   Course: gradually worsening   Comments: 4 month fu ou and oct/fp Pt states, "My OS seems to be off some. I feel like I am having more floaters too. Dr. Katy Fitch said I have not had a significant change but I think it is more blurry." Denies any FOL or curtains and veils  Pt reports using Latanoprost QHS OU A1C: 7.0 LBS: 219       Last edited by Kendra Opitz, COA on 11/03/2020 10:05 AM.      Referring physician: Seward Carol, MD [address] [place],  [place] [address]  HISTORICAL INFORMATION:   Selected notes from the MEDICAL RECORD NUMBER    Lab Results  Component Value Date   HGBA1C 7.1 (A) 07/27/2020     CURRENT MEDICATIONS: Current Outpatient Medications (Ophthalmic Drugs)  Medication Sig   latanoprost (XALATAN) 0.005 % ophthalmic solution 1 drop into affected eye in the evening   LUMIGAN 0.01 % SOLN    Travoprost, BAK Free, (TRAVATAN) 0.004 % SOLN ophthalmic solution Place 1 drop into both eyes at bedtime.    No current facility-administered medications for this visit. (Ophthalmic Drugs)   Current Outpatient Medications (Other)  Medication Sig   acetaminophen (TYLENOL) 500 MG tablet Take 500-1,000 mg by mouth every 6 (six) hours as needed (for pain or headaches).   Alcohol Swabs PADS See admin instructions.   allopurinol (ZYLOPRIM) 100 MG  tablet 1 tablet   apixaban (ELIQUIS) 5 MG TABS tablet Take 1 tablet (5 mg total) by mouth 2 (two) times daily.   atorvastatin (LIPITOR) 80 MG tablet Take 1 tablet (80 mg total) by mouth at bedtime.   Biotin 10 MG TABS 1 tablet   carvedilol (COREG) 25 MG tablet 1 tablet by mouth 2 times daily with food   clotrimazole-betamethasone (LOTRISONE) lotion Apply a small amount twice daily to affected area(s) for 14 days, then use as needed for itch.   empagliflozin (JARDIANCE) 10 MG TABS tablet 1 tablet   empagliflozin (JARDIANCE) 10 MG TABS tablet Take 10 mg by mouth daily.   Ferrous Sulfate (IRON) 325 (65 Fe) MG TABS Take 1 tablet by mouth daily.   gabapentin (NEURONTIN) 300 MG capsule Take 300 mg by mouth 2 (two) times daily.   insulin degludec (TRESIBA FLEXTOUCH) 100 UNIT/ML FlexTouch Pen Inject 50 Units into the skin daily.   insulin lispro (HUMALOG) 100 UNIT/ML injection 26 units   isosorbide mononitrate (IMDUR) 30 MG 24 hr tablet TAKE 1 TABLET BY MOUTH ONCE DAILY   levothyroxine (SYNTHROID, LEVOTHROID) 75 MCG tablet Take 75 mcg by mouth daily before breakfast.   losartan (COZAAR) 50 MG tablet TAKE 1 TABLET BY MOUTH AT BEDTIME   Multiple Vitamins-Minerals (ALIVE  ONCE DAILY WOMENS 50+ PO) Take 1 tablet by mouth daily.   mupirocin ointment (BACTROBAN) 2 % Apply to left great toe once daily for 14 days.   nitroGLYCERIN (NITROSTAT) 0.4 MG SL tablet DISSOLVE ONE TABLET UNDER THE TONGUE EVERY 5 MINUTES AS NEEDED FOR CHEST PAIN.   Omega-3 Fatty Acids (FISH OIL PO) Take 1 capsule by mouth daily.   potassium chloride (KLOR-CON) 10 MEQ tablet Take 20 mEq by mouth daily.   potassium chloride (KLOR-CON) 10 MEQ tablet Take 2 tablets (20 mEq total) by mouth daily.   RELION PEN NEEDLE 31G/8MM 31G X 8 MM MISC USE 1 THREE TIMES DAILY AS DIRECTED   spironolactone (ALDACTONE) 25 MG tablet Take 1 tablet (25 mg total) by mouth daily.   torsemide (DEMADEX) 20 MG tablet Take 2 tablets (40 mg total) by mouth daily.    No current facility-administered medications for this visit. (Other)      REVIEW OF SYSTEMS:    ALLERGIES Allergies  Allergen Reactions   Other Other (See Comments)    Blood products- Patient won't accept any Other reaction(s): rash   Sulfa Antibiotics Rash   Uloric [Febuxostat] Rash    PAST MEDICAL HISTORY 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[place]) 02/05/2017   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 05/20/2018   Acute kidney failure ([place]) 05/15/2018   Arthritis    "all over" (05/22/2018)   Blood dyscrasia    per pt-has small blood cells-appears as if anemic   Breast cancer, left breast ([place]) 1985   CHF (congestive heart failure) (HCC)    Complication of anesthesia    difficult to awaken from per pt   Coronary artery disease    Dyspnea    Essential hypertension    Heart disease    Heart murmur    History of gout    Hypothyroidism    Obesity (BMI 30-39.9) 11/11/2017   OSA (obstructive sleep apnea) 02/18/2017    severe obstructive sleep apnea with an AHI of 75.6/h and mild central sleep apnea with a CAI of 7.7/h.  Oxygen saturations dropped as low as 82%.   He is on CPAP at 9 cm H2O.   OSA on CPAP    Personal history of chemotherapy    Personal history of radiation therapy    Presence of permanent cardiac pacemaker    Primary localized osteoarthritis of right knee 05/13/2018   Refusal of blood transfusions as patient is Jehovah's Witness    Type II diabetes mellitus ([place])    Past Surgical History:  Procedure Laterality Date   BREAST BIOPSY Left 1985   BREAST LUMPECTOMY Left 1985   BREAST LUMPECTOMY WITH NEEDLE LOCALIZATION AND AXILLARY LYMPH NODE DISSECTION  1985   CATARACT EXTRACTION W/ INTRAOCULAR LENS  IMPLANT, BILATERAL Bilateral    CORONARY ANGIOPLASTY WITH STENT PLACEMENT  2010, 2012,2017    2 done 2010 and 1 replaced 2012 and 2 replaced in 2017   [place] / REPLACE / REMOVE PACEMAKER  07/19/2014   JOINT REPLACEMENT     REPLACEMENT TOTAL KNEE Left 09/2012   THYROIDECTOMY, PARTIAL  1978   TONSILLECTOMY     TOTAL KNEE ARTHROPLASTY Right 05/13/2018   Procedure: TOTAL KNEE ARTHROPLASTY;  Surgeon: Marchia Bond, MD;  Location: [facility];  Service: Orthopedics;  Laterality: Right;   TUBAL LIGATION      FAMILY HISTORY Family History  Problem Relation Age of Onset   Multiple myeloma Mother    Heart disease Father    Other Sister    Heart disease Brother    Diabetes Sister    Other Sister     SOCIAL HISTORY Social History   Tobacco Use   Smoking status: Former    Packs/day: 1.00    Years: 22.00    Pack years: 22.00    Types: Cigarettes    Quit date: 77    Years since quitting: 39.4   Smokeless tobacco: Never  Vaping Use   Vaping Use: Never used  Substance Use Topics   Alcohol use: Yes    Comment: 05/22/2018 "glass of wine couple times/wk"   Drug use: Not Currently         OPHTHALMIC EXAM:  Base Eye Exam     Visual Acuity (ETDRS)       Right Left   Dist cc 20/40 -1 20/40 -2   Dist ph cc NI NI    Correction: Glasses         Tonometry (Tonopen, 10:10 AM)       Right Left   Pressure 12 10         Pupils       Pupils Dark Light Shape React APD   Right PERRL 4 3 Round Brisk None   Left PERRL 4 3 Round Brisk None         Visual Fields       Left Right     Full   Restrictions Partial outer superior temporal, superior nasal deficiencies          Neuro/Psych     Oriented x3: Yes   Mood/Affect: Normal         Dilation     Both eyes: 1.0% Mydriacyl, 2.5% Phenylephrine @ 10:10 AM           Slit Lamp and Fundus Exam     External Exam       Right Left   External Normal Normal         Slit Lamp Exam       Right Left   Lids/Lashes Normal Normal   Conjunctiva/Sclera White and quiet White and quiet   Cornea Clear Clear   Anterior Chamber Deep and quiet Deep  and quiet   Iris Round and reactive Round and reactive   Lens Centered posterior chamber intraocular lens, Open posterior capsule Centered posterior chamber intraocular lens, Open posterior capsule   Anterior Vitreous Normal Normal         Fundus Exam       Right Left   Posterior Vitreous Normal Normal   Disc Normal Normal   C/D Ratio 0.75 0.75   Macula Microaneurysms, no macular thickening Microaneurysms, macular thickening, Severe clinically significant macular edema centered threatening but not center involved OS., Exudates,    Vessels NPDR- Moderate NPDR- Moderate   Periphery Normal Normal            IMAGING AND PROCEDURES  Imaging and Procedures for 11/03/20  OCT, Retina - OU - Both Eyes       Right Eye Quality was good. Scan locations included subfoveal. Central Foveal Thickness: 272. Progression has been stable. Findings include normal foveal contour.   Left Eye Quality was good. Scan locations included subfoveal. Central Foveal Thickness: 272. Progression has improved. Findings include cystoid macular edema.   Notes CSME, with protected fovea, not center involved, temporally OS.  Slightly less CSME temporally  OS post focal 4 months previous     Color Fundus Photography Optos - OU - Both Eyes       Right Eye Progression has been stable. Disc findings include normal observations. Macula : microaneurysms.   Left Eye Progression has improved. Disc findings include normal observations. Macula : edema, exudates, microaneurysms.   Notes Bilateral severe NPDR good focal laser treatment right eye and left eye  Observe for further resolution CSME temporally OS              ASSESSMENT/PLAN:  Severe nonproliferative diabetic retinopathy of left eye, with macular edema, associated with type 2 diabetes mellitus (HCC) Circinate ring continues 4 months later, yet less CSME by OCT.  We will continue to monitor and observe     ICD-10-CM   1. Severe  nonproliferative diabetic retinopathy of left eye, with macular edema, associated with type 2 diabetes mellitus (HCC)  G81.1572 OCT, Retina - OU - Both Eyes    Color Fundus Photography Optos - OU - Both Eyes    2. Severe nonproliferative diabetic retinopathy of right eye, with macular edema, associated with type 2 diabetes mellitus (HCC)  E11.3411 OCT, Retina - OU - Both Eyes    Color Fundus Photography Optos - OU - Both Eyes      1.  2.  3.  Ophthalmic Meds Ordered this visit:  No orders of the defined types were placed in this encounter.      Return in about 3 months (around 02/03/2021) for DILATE OU, OCT.  There are no Patient Instructions on file for this visit.   Explained the diagnoses, plan, and follow up with the patient and they expressed understanding.  Patient expressed understanding of the importance of proper follow up care.   Clent Demark Rhys Lichty M.D. Diseases & Surgery of the Retina and Vitreous [facility] [place] 11/03/20     Abbreviations: M myopia (nearsighted); A astigmatism; H hyperopia (farsighted); P presbyopia; Mrx spectacle prescription;  CTL contact lenses; OD right eye; OS left eye; OU both eyes  XT exotropia; ET esotropia; PEK punctate epithelial keratitis; PEE punctate epithelial erosions; DES dry eye syndrome; MGD meibomian gland dysfunction; ATs artificial tears; PFAT's preservative free artificial tears; [place] nuclear sclerotic cataract; PSC posterior subcapsular cataract; ERM epi-retinal membrane; PVD posterior vitreous detachment; RD retinal detachment; DM diabetes mellitus; DR diabetic retinopathy; NPDR non-proliferative diabetic retinopathy; PDR proliferative diabetic retinopathy; CSME clinically significant macular edema; DME diabetic macular edema; dbh dot blot hemorrhages; CWS cotton wool spot; POAG primary open angle glaucoma; C/D cup-to-disc ratio; HVF humphrey visual field; GVF goldmann visual field; OCT optical coherence tomography; IOP  intraocular pressure; BRVO Branch retinal vein occlusion; CRVO central retinal vein occlusion; CRAO central retinal artery occlusion; BRAO branch retinal artery occlusion; RT retinal tear; SB scleral buckle; PPV pars plana vitrectomy; VH Vitreous hemorrhage; PRP panretinal laser photocoagulation; IVK intravitreal kenalog; VMT vitreomacular traction; MH Macular hole;  NVD neovascularization of the disc; NVE neovascularization elsewhere; AREDS age related eye disease study; ARMD age related macular degeneration; POAG primary open angle glaucoma; EBMD epithelial/anterior basement membrane dystrophy; ACIOL anterior chamber intraocular lens; IOL intraocular lens; PCIOL posterior chamber intraocular lens; Phaco/IOL phacoemulsification with intraocular lens placement; [place] photorefractive keratectomy; LASIK laser assisted in situ keratomileusis; HTN hypertension; DM diabetes mellitus; COPD chronic obstructive pulmonary disease

## 2020-11-03 NOTE — Assessment & Plan Note (Signed)
No active progression of disease OD

## 2020-11-04 DIAGNOSIS — R2689 Other abnormalities of gait and mobility: Secondary | ICD-10-CM | POA: Diagnosis not present

## 2020-11-04 DIAGNOSIS — H938X2 Other specified disorders of left ear: Secondary | ICD-10-CM | POA: Diagnosis not present

## 2020-11-04 DIAGNOSIS — R0982 Postnasal drip: Secondary | ICD-10-CM | POA: Diagnosis not present

## 2020-11-04 DIAGNOSIS — H60391 Other infective otitis externa, right ear: Secondary | ICD-10-CM | POA: Diagnosis not present

## 2020-11-04 DIAGNOSIS — R519 Headache, unspecified: Secondary | ICD-10-CM | POA: Diagnosis not present

## 2020-11-09 ENCOUNTER — Telehealth: Payer: Self-pay | Admitting: Internal Medicine

## 2020-11-09 ENCOUNTER — Other Ambulatory Visit: Payer: Self-pay | Admitting: Internal Medicine

## 2020-11-09 MED ORDER — INSULIN LISPRO (1 UNIT DIAL) 100 UNIT/ML (KWIKPEN): PEN_INJECTOR | SUBCUTANEOUS | 11 refills | Status: DC

## 2020-11-09 NOTE — Telephone Encounter (Signed)
Last OV--07/27/20 Rx Insulin lispro (humalog) --prescribe by historical provider. Please advise

## 2020-11-09 NOTE — Telephone Encounter (Signed)
Pt is calling regarding insulin lispro (HUMALOG) 100 UNIT/ML injection Pt is completely out of insulin  Insurance is needing a call [phone]

## 2020-11-10 ENCOUNTER — Other Ambulatory Visit: Payer: Self-pay | Admitting: Internal Medicine

## 2020-11-10 ENCOUNTER — Other Ambulatory Visit (HOSPITAL_COMMUNITY): Payer: Self-pay

## 2020-11-10 ENCOUNTER — Telehealth: Payer: Self-pay | Admitting: Pharmacy Technician

## 2020-11-10 MED ORDER — NOVOLOG FLEXPEN 100 UNIT/ML ~~LOC~~ SOPN: PEN_INJECTOR | SUBCUTANEOUS | 11 refills | Status: DC

## 2020-11-10 NOTE — Telephone Encounter (Addendum)
Patient Advocate Encounter   Received notification from [facility] that prior authorization for HUMALOG [facility] is required.    Status is DENIED.  NOVOLOG FLEXPEN is on formula and has been sent to pharmacy.    [facility] will continue to follow.   Venida Jarvis. Nadara Mustard, CPhT Patient Advocate [facility] Phone: [phone] Fax:  [phone]

## 2020-11-10 NOTE — Progress Notes (Signed)
Paramedicine Encounter    Patient ID: Danielle [name], female    DOB: 1946-01-15, 75 y.o.   MRN: [id number]   Patient Care Team: Seward Carol, MD as PCP - General (Internal Medicine) Constance Haw, MD as PCP - Electrophysiology (Cardiology) Sueanne Margarita, MD as PCP - Sleep Medicine (Cardiology) Jorge Ny, LCSW as Social Worker (Licensed Clinical Social Worker) Shamleffer, Melanie Crazier, MD as Consulting Physician (Endocrinology)  Patient Active Problem List   Diagnosis Date Noted   Polyneuropathy associated with underlying disease ([place]) 05/04/2020   Severe nonproliferative diabetic retinopathy of left eye, with macular edema, associated with type 2 diabetes mellitus ([place]) 11/05/2019   Severe nonproliferative diabetic retinopathy of right eye, with macular edema, associated with type 2 diabetes mellitus ([place]) 11/05/2019   Retinal hemorrhage of right eye 11/05/2019   Retinal hemorrhage of left eye 11/05/2019   Type 2 diabetes mellitus with hyperglycemia, with long-term current use of insulin ([place]) 05/14/2019   Diabetes mellitus ([place]) 05/14/2019   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, with long-term current use of insulin ([place]) 05/14/2019   Type 2 diabetes mellitus with diabetic polyneuropathy, with long-term current use of insulin ([place]) 05/14/2019   Encephalopathy acute 05/20/2018   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 05/20/2018   Acute kidney failure ([place]) 05/15/2018   Primary localized osteoarthritis of right knee 05/13/2018   (HFpEF) heart failure with preserved ejection fraction ([place]) 12/20/2017   Obesity (BMI 30-39.9) 11/11/2017   OSA (obstructive sleep apnea) 02/18/2017   Essential hypertension    Diabetes mellitus without complication (HCC)    Cancer (HCC)    Thyroid disease     Current Outpatient Medications:    acetaminophen (TYLENOL) 500 MG tablet, Take 500-1,000 mg by mouth every 6 (six) hours as needed (for pain or  headaches)., Disp: , Rfl:    Alcohol Swabs PADS, See admin instructions., Disp: , Rfl:    allopurinol (ZYLOPRIM) 100 MG tablet, 1 tablet, Disp: , Rfl:    apixaban (ELIQUIS) 5 MG TABS tablet, Take 1 tablet (5 mg total) by mouth 2 (two) times daily., Disp: 60 tablet, Rfl: 11   atorvastatin (LIPITOR) 80 MG tablet, Take 1 tablet (80 mg total) by mouth at bedtime., Disp: 90 tablet, Rfl: 1   Biotin 10 MG TABS, 1 tablet, Disp: , Rfl:    carvedilol (COREG) 25 MG tablet, 1 tablet by mouth 2 times daily with food, Disp: 60 tablet, Rfl: 11   clotrimazole-betamethasone (LOTRISONE) lotion, Apply a small amount twice daily to affected area(s) for 14 days, then use as needed for itch., Disp: , Rfl:    empagliflozin (JARDIANCE) 10 MG TABS tablet, 1 tablet, Disp: , Rfl:    empagliflozin (JARDIANCE) 10 MG TABS tablet, Take 10 mg by mouth daily., Disp: , Rfl:    Ferrous Sulfate (IRON) 325 (65 Fe) MG TABS, Take 1 tablet by mouth daily., Disp: , Rfl:    gabapentin (NEURONTIN) 300 MG capsule, Take 300 mg by mouth 2 (two) times daily., Disp: , Rfl:    insulin degludec (TRESIBA FLEXTOUCH) 100 UNIT/ML FlexTouch Pen, Inject 50 Units into the skin daily., Disp: 30 mL, Rfl: 6   insulin lispro (HUMALOG KWIKPEN) 100 UNIT/ML KwikPen, Max daily 30 units, Disp: 15 mL, Rfl: 11   isosorbide mononitrate (IMDUR) 30 MG 24 hr tablet, TAKE 1 TABLET BY MOUTH ONCE DAILY, Disp: 30 tablet, Rfl: 11   latanoprost (XALATAN) 0.005 % ophthalmic solution, 1 drop into affected eye in the evening,  Disp: , Rfl:    levothyroxine (SYNTHROID, LEVOTHROID) 75 MCG tablet, Take 75 mcg by mouth daily before breakfast., Disp: , Rfl:    losartan (COZAAR) 50 MG tablet, TAKE 1 TABLET BY MOUTH AT BEDTIME, Disp: 30 tablet, Rfl: 11   LUMIGAN 0.01 % SOLN, , Disp: , Rfl:    Multiple Vitamins-Minerals (ALIVE ONCE DAILY WOMENS 50+ PO), Take 1 tablet by mouth daily., Disp: , Rfl:    mupirocin ointment (BACTROBAN) 2 %, Apply to left great toe once daily for 14 days.,  Disp: 30 g, Rfl: 1   nitroGLYCERIN (NITROSTAT) 0.4 MG SL tablet, DISSOLVE ONE TABLET UNDER THE TONGUE EVERY 5 MINUTES AS NEEDED FOR CHEST PAIN., Disp: 25 tablet, Rfl: 3   Omega-3 Fatty Acids (FISH OIL PO), Take 1 capsule by mouth daily., Disp: , Rfl:    potassium chloride (KLOR-CON) 10 MEQ tablet, Take 20 mEq by mouth daily., Disp: , Rfl:    potassium chloride (KLOR-CON) 10 MEQ tablet, Take 2 tablets (20 mEq total) by mouth daily., Disp: 60 tablet, Rfl: 1   RELION PEN NEEDLE 31G/8MM 31G X 8 MM MISC, USE 1 THREE TIMES DAILY AS DIRECTED, Disp: , Rfl:    spironolactone (ALDACTONE) 25 MG tablet, Take 1 tablet (25 mg total) by mouth daily., Disp: 30 tablet, Rfl: 11   torsemide (DEMADEX) 20 MG tablet, Take 2 tablets (40 mg total) by mouth daily., Disp: 60 tablet, Rfl: 11   Travoprost, BAK Free, (TRAVATAN) 0.004 % SOLN ophthalmic solution, Place 1 drop into both eyes at bedtime. , Disp: , Rfl:  Allergies  Allergen Reactions   Other Other (See Comments)    Blood products- Patient won't accept any Other reaction(s): rash   Sulfa Antibiotics Rash   Uloric [Febuxostat] Rash     Social History   Socioeconomic History   Marital status: Widowed    Spouse name: Not on file   Number of children: 3   Years of education: Not on file   Highest education level: Some college, no degree  Occupational History   Occupation: retired  Tobacco Use   Smoking status: Former    Packs/day: 1.00    Years: 22.00    Pack years: 22.00    Types: Cigarettes    Quit date: 1983    Years since quitting: 39.4   Smokeless tobacco: Never  Vaping Use   Vaping Use: Never used  Substance and Sexual Activity   Alcohol use: Yes    Comment: 05/22/2018 "glass of wine couple times/wk"   Drug use: Not Currently   Sexual activity: Not Currently    Birth control/protection: None, Post-menopausal  Other Topics Concern   Not on file  Social History Narrative   Not on file   Social Determinants of Health   Financial  Resource Strain: Not on file  Food Insecurity: Not on file  Transportation Needs: Not on file  Physical Activity: Not on file  Stress: Not on file  Social Connections: Not on file  Intimate Partner Violence: Not on file    Physical Exam   Arrived for home visit for Davona today who was alert and oriented reporting to not be feeling well today. Yulia currently has an ear infection being treated by antibiotic ear drops. She reports this is affecting her balance and she feels very unsteady. Trulee has a follow up with ENT next week. Arianis also reports she has had issues affording her insulin and that her Humalog is over $100, it appears [facility]  is attempting to assist with this switching to Novolog however I called to [facility] and they report the medication is not covered under her insurance and the cost is $106. [name] is unable to pay this amount for insulin, I will be reaching out to pharmacy team at [facility] to see how we can assist. Ermelinda only has a few doses of her Humlog left and her CBG's are running over 300. I educated Purva on the importance of her medications and diet and to try to maintain a safe blood sugar despite lack of insulin, she understood same. Medications were reviewed and confirmed. Pill box filled accordingly. Vitals and assessment as noted. Home visit complete. I will see Mishal in one week and be following up with medication assistance.   Refills: Gabapentin (out of refills) Eliquis (ready) Torsemide (ready)   Future Appointments  Date Time Provider [place]  11/30/2020 10:10 AM Shamleffer, Melanie Crazier, MD [facility] None  12/02/2020  7:05 AM [facility] DEVICE REMOTES [facility]  02/09/2021 10:15 AM Rankin, Clent Demark, MD [facility] None  03/03/2021  7:05 AM [facility] DEVICE REMOTES [facility]  06/02/2021  7:05 AM [facility] DEVICE REMOTES [facility]  09/01/2021  7:05 AM [facility]  DEVICE REMOTES [facility]     ACTION: Home visit completed

## 2020-11-14 NOTE — Telephone Encounter (Addendum)
[facility]  [facility]  [phone]    Reached out about the patient's medication costing $106 which they cannot afford.  I responded by stating that  "The medication is covered by her insurance.  The cost is $150.94 original cost and was brought down to $106.   I called her insurance and asked for a Tier exception to hopefully bring the cost down further.  It denied stating the medication is already at the lower tier.   There are other ways to bring the cost down, that maybe you can help her with.  ..   Through [facility] ([place])  [phone]   Prescription [facility] [phone]   I will follow the Tier exception request and once approved, contact the pharmacy to get the new cost."    I asked the nurse for a sample, which there are none and included the provider in this written conversation.   Danielle [name]. Danielle [name], CPhT Patient Advocate [facility] Phone: [phone] Fax:  [phone]

## 2020-11-17 ENCOUNTER — Other Ambulatory Visit (HOSPITAL_COMMUNITY): Payer: Self-pay

## 2020-11-17 NOTE — Progress Notes (Signed)
Paramedicine Encounter    Patient ID: Danielle [name], female    DOB: 23-Apr-1946, 75 y.o.   MRN: [id number]   Patient Care Team: Seward Carol, MD as PCP - General (Internal Medicine) Constance Haw, MD as PCP - Electrophysiology (Cardiology) Sueanne Margarita, MD as PCP - Sleep Medicine (Cardiology) Jorge Ny, LCSW as Social Worker (Licensed Clinical Social Worker) Shamleffer, Melanie Crazier, MD as Consulting Physician (Endocrinology)  Patient Active Problem List   Diagnosis Date Noted   Polyneuropathy associated with underlying disease ([place]) 05/04/2020   Severe nonproliferative diabetic retinopathy of left eye, with macular edema, associated with type 2 diabetes mellitus ([place]) 11/05/2019   Severe nonproliferative diabetic retinopathy of right eye, with macular edema, associated with type 2 diabetes mellitus ([place]) 11/05/2019   Retinal hemorrhage of right eye 11/05/2019   Retinal hemorrhage of left eye 11/05/2019   Type 2 diabetes mellitus with hyperglycemia, with long-term current use of insulin ([place]) 05/14/2019   Diabetes mellitus ([place]) 05/14/2019   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, with long-term current use of insulin ([place]) 05/14/2019   Type 2 diabetes mellitus with diabetic polyneuropathy, with long-term current use of insulin ([place]) 05/14/2019   Encephalopathy acute 05/20/2018   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 05/20/2018   Acute kidney failure ([place]) 05/15/2018   Primary localized osteoarthritis of right knee 05/13/2018   (HFpEF) heart failure with preserved ejection fraction ([place]) 12/20/2017   Obesity (BMI 30-39.9) 11/11/2017   OSA (obstructive sleep apnea) 02/18/2017   Essential hypertension    Diabetes mellitus without complication (HCC)    Cancer (HCC)    Thyroid disease     Current Outpatient Medications:    acetaminophen (TYLENOL) 500 MG tablet, Take 500-1,000 mg by mouth every 6 (six) hours as needed (for pain or  headaches)., Disp: , Rfl:    Alcohol Swabs PADS, See admin instructions., Disp: , Rfl:    allopurinol (ZYLOPRIM) 100 MG tablet, 1 tablet (Patient not taking: Reported on 11/10/2020), Disp: , Rfl:    apixaban (ELIQUIS) 5 MG TABS tablet, Take 1 tablet (5 mg total) by mouth 2 (two) times daily., Disp: 60 tablet, Rfl: 11   atorvastatin (LIPITOR) 80 MG tablet, Take 1 tablet (80 mg total) by mouth at bedtime., Disp: 90 tablet, Rfl: 1   Biotin 10 MG TABS, 1 tablet, Disp: , Rfl:    carvedilol (COREG) 25 MG tablet, 1 tablet by mouth 2 times daily with food, Disp: 60 tablet, Rfl: 11   clotrimazole-betamethasone (LOTRISONE) lotion, Apply a small amount twice daily to affected area(s) for 14 days, then use as needed for itch., Disp: , Rfl:    empagliflozin (JARDIANCE) 10 MG TABS tablet, 1 tablet, Disp: , Rfl:    empagliflozin (JARDIANCE) 10 MG TABS tablet, Take 10 mg by mouth daily., Disp: , Rfl:    Ferrous Sulfate (IRON) 325 (65 Fe) MG TABS, Take 1 tablet by mouth daily., Disp: , Rfl:    gabapentin (NEURONTIN) 300 MG capsule, Take 300 mg by mouth 2 (two) times daily., Disp: , Rfl:    insulin aspart (NOVOLOG FLEXPEN) 100 UNIT/ML FlexPen, Max daily 30 units per correction scale, Disp: 15 mL, Rfl: 11   insulin degludec (TRESIBA FLEXTOUCH) 100 UNIT/ML FlexTouch Pen, Inject 50 Units into the skin daily., Disp: 30 mL, Rfl: 6   isosorbide mononitrate (IMDUR) 30 MG 24 hr tablet, TAKE 1 TABLET BY MOUTH ONCE DAILY, Disp: 30 tablet, Rfl: 11   latanoprost (XALATAN) 0.005 % ophthalmic  solution, 1 drop into affected eye in the evening, Disp: , Rfl:    levothyroxine (SYNTHROID, LEVOTHROID) 75 MCG tablet, Take 75 mcg by mouth daily before breakfast., Disp: , Rfl:    losartan (COZAAR) 50 MG tablet, TAKE 1 TABLET BY MOUTH AT BEDTIME, Disp: 30 tablet, Rfl: 11   LUMIGAN 0.01 % SOLN, , Disp: , Rfl:    Multiple Vitamins-Minerals (ALIVE ONCE DAILY WOMENS 50+ PO), Take 1 tablet by mouth daily., Disp: , Rfl:    mupirocin ointment  (BACTROBAN) 2 %, Apply to left great toe once daily for 14 days., Disp: 30 g, Rfl: 1   nitroGLYCERIN (NITROSTAT) 0.4 MG SL tablet, DISSOLVE ONE TABLET UNDER THE TONGUE EVERY 5 MINUTES AS NEEDED FOR CHEST PAIN., Disp: 25 tablet, Rfl: 3   Omega-3 Fatty Acids (FISH OIL PO), Take 1 capsule by mouth daily., Disp: , Rfl:    potassium chloride (KLOR-CON) 10 MEQ tablet, Take 20 mEq by mouth daily., Disp: , Rfl:    potassium chloride (KLOR-CON) 10 MEQ tablet, Take 2 tablets (20 mEq total) by mouth daily., Disp: 60 tablet, Rfl: 1   RELION PEN NEEDLE 31G/8MM 31G X 8 MM MISC, USE 1 THREE TIMES DAILY AS DIRECTED, Disp: , Rfl:    spironolactone (ALDACTONE) 25 MG tablet, Take 1 tablet (25 mg total) by mouth daily., Disp: 30 tablet, Rfl: 11   torsemide (DEMADEX) 20 MG tablet, Take 2 tablets (40 mg total) by mouth daily., Disp: 60 tablet, Rfl: 11   Travoprost, BAK Free, (TRAVATAN) 0.004 % SOLN ophthalmic solution, Place 1 drop into both eyes at bedtime. , Disp: , Rfl:  Allergies  Allergen Reactions   Other Other (See Comments)    Blood products- Patient won't accept any Other reaction(s): rash   Sulfa Antibiotics Rash   Uloric [Febuxostat] Rash     Social History   Socioeconomic History   Marital status: Widowed    Spouse name: Not on file   Number of children: 3   Years of education: Not on file   Highest education level: Some college, no degree  Occupational History   Occupation: retired  Tobacco Use   Smoking status: Former    Packs/day: 1.00    Years: 22.00    Pack years: 22.00    Types: Cigarettes    Quit date: 1983    Years since quitting: 39.5   Smokeless tobacco: Never  Vaping Use   Vaping Use: Never used  Substance and Sexual Activity   Alcohol use: Yes    Comment: 05/22/2018 "glass of wine couple times/wk"   Drug use: Not Currently   Sexual activity: Not Currently    Birth control/protection: None, Post-menopausal  Other Topics Concern   Not on file  Social History Narrative    Not on file   Social Determinants of Health   Financial Resource Strain: Not on file  Food Insecurity: Not on file  Transportation Needs: Not on file  Physical Activity: Not on file  Stress: Not on file  Social Connections: Not on file  Intimate Partner Violence: Not on file    Physical Exam Vitals reviewed.  Constitutional:      Appearance: Normal appearance. She is normal weight.  HENT:     Head: Normocephalic.     Nose: Nose normal.     Mouth/Throat:     Mouth: Mucous membranes are moist.     Pharynx: Oropharynx is clear.  Eyes:     Conjunctiva/sclera: Conjunctivae normal.     Pupils:  Pupils are equal, round, and reactive to light.  Cardiovascular:     Rate and Rhythm: Normal rate and regular rhythm.     Pulses: Normal pulses.     Heart sounds: Normal heart sounds.  Pulmonary:     Effort: Pulmonary effort is normal.     Breath sounds: Normal breath sounds.  Abdominal:     General: Abdomen is flat.     Palpations: Abdomen is soft.  Musculoskeletal:        General: No swelling. Normal range of motion.     Cervical back: Normal range of motion.     Right lower leg: No edema.     Left lower leg: No edema.  Skin:    General: Skin is warm and dry.     Capillary Refill: Capillary refill takes less than 2 seconds.  Neurological:     General: No focal deficit present.     Mental Status: She is alert. Mental status is at baseline.  Psychiatric:        Mood and Affect: Mood normal.    Arrived for home visit for [place] who was alert and oriented reporting to be feeling okay but still having issues with the inner ear infection  as well as her balance. Rafaela denied chest pain, shortness of breath, or trouble sleeping/eating. She has been complaint with her medications.   Vitals were obtained and are as noted. Assessment as noted. Lung sounds clear. CBG- 67 (Leaha had lunch and insulin at 1230 but hasn't eaten since, she is going to be eating dinner after our visit)    I reviewed medications and filled two pill boxes accordingly.   I assisted [facility] in applying for [employer] Patient assistance for Eliquis as well as [employer] Patient assistance for Humalog.   I will get these signed by prescribers and faxed.   Home visit complete. I will see Nataleigh in two weeks.      Future Appointments  Date Time Provider [place]  11/30/2020 10:10 AM Shamleffer, Melanie Crazier, MD [facility] None  12/02/2020  7:05 AM [facility] DEVICE REMOTES [facility]  02/09/2021 10:15 AM Rankin, Clent Demark, MD [facility] None  03/03/2021  7:05 AM [facility] DEVICE REMOTES [facility]  06/02/2021  7:05 AM [facility] DEVICE REMOTES [facility]  09/01/2021  7:05 AM [facility] DEVICE REMOTES [facility]     ACTION: Home visit completed

## 2020-11-30 ENCOUNTER — Other Ambulatory Visit (HOSPITAL_COMMUNITY): Payer: Self-pay | Admitting: *Deleted

## 2020-11-30 ENCOUNTER — Ambulatory Visit: Payer: Medicare HMO | Admitting: Internal Medicine

## 2020-11-30 ENCOUNTER — Telehealth (HOSPITAL_COMMUNITY): Payer: Self-pay

## 2020-11-30 MED ORDER — ATORVASTATIN CALCIUM 80 MG PO TABS
80.0000 mg | ORAL_TABLET | Freq: Every day | ORAL | 3 refills | Status: DC
Start: 2020-11-30 — End: 2021-03-29

## 2020-11-30 NOTE — Telephone Encounter (Signed)
Spoke with Danielle [name] who agreed to home visit  for tomorrow at 10:00.

## 2020-12-01 ENCOUNTER — Other Ambulatory Visit (HOSPITAL_COMMUNITY): Payer: Self-pay

## 2020-12-01 DIAGNOSIS — R0982 Postnasal drip: Secondary | ICD-10-CM | POA: Diagnosis not present

## 2020-12-01 DIAGNOSIS — H938X2 Other specified disorders of left ear: Secondary | ICD-10-CM | POA: Diagnosis not present

## 2020-12-01 DIAGNOSIS — H6241 Otitis externa in other diseases classified elsewhere, right ear: Secondary | ICD-10-CM | POA: Diagnosis not present

## 2020-12-01 DIAGNOSIS — R2689 Other abnormalities of gait and mobility: Secondary | ICD-10-CM | POA: Diagnosis not present

## 2020-12-01 DIAGNOSIS — B369 Superficial mycosis, unspecified: Secondary | ICD-10-CM | POA: Diagnosis not present

## 2020-12-01 DIAGNOSIS — R519 Headache, unspecified: Secondary | ICD-10-CM | POA: Diagnosis not present

## 2020-12-01 NOTE — Progress Notes (Signed)
Paramedicine Encounter    Patient ID: Danielle [name], female    DOB: February 12, 1946, 75 y.o.   MRN: [id number]   Patient Care Team: Seward Carol, MD as PCP - General (Internal Medicine) Constance Haw, MD as PCP - Electrophysiology (Cardiology) Sueanne Margarita, MD as PCP - Sleep Medicine (Cardiology) Jorge Ny, LCSW as Social Worker (Licensed Clinical Social Worker) Shamleffer, Melanie Crazier, MD as Consulting Physician (Endocrinology)  Patient Active Problem List   Diagnosis Date Noted   Polyneuropathy associated with underlying disease ([place]) 05/04/2020   Severe nonproliferative diabetic retinopathy of left eye, with macular edema, associated with type 2 diabetes mellitus ([place]) 11/05/2019   Severe nonproliferative diabetic retinopathy of right eye, with macular edema, associated with type 2 diabetes mellitus ([place]) 11/05/2019   Retinal hemorrhage of right eye 11/05/2019   Retinal hemorrhage of left eye 11/05/2019   Type 2 diabetes mellitus with hyperglycemia, with long-term current use of insulin ([place]) 05/14/2019   Diabetes mellitus ([place]) 05/14/2019   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, with long-term current use of insulin ([place]) 05/14/2019   Type 2 diabetes mellitus with diabetic polyneuropathy, with long-term current use of insulin ([place]) 05/14/2019   Encephalopathy acute 05/20/2018   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 05/20/2018   Acute kidney failure ([place]) 05/15/2018   Primary localized osteoarthritis of right knee 05/13/2018   (HFpEF) heart failure with preserved ejection fraction ([place]) 12/20/2017   Obesity (BMI 30-39.9) 11/11/2017   OSA (obstructive sleep apnea) 02/18/2017   Essential hypertension    Diabetes mellitus without complication (HCC)    Cancer (HCC)    Thyroid disease     Current Outpatient Medications:    acetaminophen (TYLENOL) 500 MG tablet, Take 500-1,000 mg by mouth every 6 (six) hours as needed (for pain or  headaches)., Disp: , Rfl:    Alcohol Swabs PADS, See admin instructions., Disp: , Rfl:    allopurinol (ZYLOPRIM) 100 MG tablet, 1 tablet (Patient not taking: No sig reported), Disp: , Rfl:    apixaban (ELIQUIS) 5 MG TABS tablet, Take 1 tablet (5 mg total) by mouth 2 (two) times daily., Disp: 60 tablet, Rfl: 11   atorvastatin (LIPITOR) 80 MG tablet, Take 1 tablet (80 mg total) by mouth at bedtime., Disp: 100 tablet, Rfl: 3   Biotin 10 MG TABS, 1 tablet, Disp: , Rfl:    carvedilol (COREG) 25 MG tablet, 1 tablet by mouth 2 times daily with food, Disp: 60 tablet, Rfl: 11   clotrimazole-betamethasone (LOTRISONE) lotion, Apply a small amount twice daily to affected area(s) for 14 days, then use as needed for itch., Disp: , Rfl:    empagliflozin (JARDIANCE) 10 MG TABS tablet, 1 tablet, Disp: , Rfl:    empagliflozin (JARDIANCE) 10 MG TABS tablet, Take 10 mg by mouth daily., Disp: , Rfl:    Ferrous Sulfate (IRON) 325 (65 Fe) MG TABS, Take 1 tablet by mouth daily., Disp: , Rfl:    gabapentin (NEURONTIN) 300 MG capsule, Take 300 mg by mouth 2 (two) times daily., Disp: , Rfl:    insulin aspart (NOVOLOG FLEXPEN) 100 UNIT/ML FlexPen, Max daily 30 units per correction scale, Disp: 15 mL, Rfl: 11   insulin degludec (TRESIBA FLEXTOUCH) 100 UNIT/ML FlexTouch Pen, Inject 50 Units into the skin daily., Disp: 30 mL, Rfl: 6   isosorbide mononitrate (IMDUR) 30 MG 24 hr tablet, TAKE 1 TABLET BY MOUTH ONCE DAILY, Disp: 30 tablet, Rfl: 11   latanoprost (XALATAN) 0.005 % ophthalmic  solution, 1 drop into affected eye in the evening, Disp: , Rfl:    levothyroxine (SYNTHROID, LEVOTHROID) 75 MCG tablet, Take 75 mcg by mouth daily before breakfast., Disp: , Rfl:    losartan (COZAAR) 50 MG tablet, TAKE 1 TABLET BY MOUTH AT BEDTIME, Disp: 30 tablet, Rfl: 11   LUMIGAN 0.01 % SOLN, , Disp: , Rfl:    Multiple Vitamins-Minerals (ALIVE ONCE DAILY WOMENS 50+ PO), Take 1 tablet by mouth daily., Disp: , Rfl:    mupirocin ointment  (BACTROBAN) 2 %, Apply to left great toe once daily for 14 days., Disp: 30 g, Rfl: 1   nitroGLYCERIN (NITROSTAT) 0.4 MG SL tablet, DISSOLVE ONE TABLET UNDER THE TONGUE EVERY 5 MINUTES AS NEEDED FOR CHEST PAIN., Disp: 25 tablet, Rfl: 3   Omega-3 Fatty Acids (FISH OIL PO), Take 1 capsule by mouth daily., Disp: , Rfl:    potassium chloride (KLOR-CON) 10 MEQ tablet, Take 20 mEq by mouth daily., Disp: , Rfl:    potassium chloride (KLOR-CON) 10 MEQ tablet, Take 2 tablets (20 mEq total) by mouth daily., Disp: 60 tablet, Rfl: 1   RELION PEN NEEDLE 31G/8MM 31G X 8 MM MISC, USE 1 THREE TIMES DAILY AS DIRECTED, Disp: , Rfl:    spironolactone (ALDACTONE) 25 MG tablet, Take 1 tablet (25 mg total) by mouth daily., Disp: 30 tablet, Rfl: 11   torsemide (DEMADEX) 20 MG tablet, Take 2 tablets (40 mg total) by mouth daily., Disp: 60 tablet, Rfl: 11   Travoprost, BAK Free, (TRAVATAN) 0.004 % SOLN ophthalmic solution, Place 1 drop into both eyes at bedtime. , Disp: , Rfl:  Allergies  Allergen Reactions   Other Other (See Comments)    Blood products- Patient won't accept any Other reaction(s): rash   Sulfa Antibiotics Rash   Uloric [Febuxostat] Rash     Social History   Socioeconomic History   Marital status: Widowed    Spouse name: Not on file   Number of children: 3   Years of education: Not on file   Highest education level: Some college, no degree  Occupational History   Occupation: retired  Tobacco Use   Smoking status: Former    Packs/day: 1.00    Years: 22.00    Pack years: 22.00    Types: Cigarettes    Quit date: 1983    Years since quitting: 39.5   Smokeless tobacco: Never  Vaping Use   Vaping Use: Never used  Substance and Sexual Activity   Alcohol use: Yes    Comment: 05/22/2018 "glass of wine couple times/wk"   Drug use: Not Currently   Sexual activity: Not Currently    Birth control/protection: None, Post-menopausal  Other Topics Concern   Not on file  Social History Narrative    Not on file   Social Determinants of Health   Financial Resource Strain: Not on file  Food Insecurity: Not on file  Transportation Needs: Not on file  Physical Activity: Not on file  Stress: Not on file  Social Connections: Not on file  Intimate Partner Violence: Not on file    Physical Exam Vitals reviewed.  Constitutional:      Appearance: Normal appearance. She is normal weight.  HENT:     Head: Normocephalic.     Nose: Nose normal.     Mouth/Throat:     Mouth: Mucous membranes are moist.     Pharynx: Oropharynx is clear.  Eyes:     Conjunctiva/sclera: Conjunctivae normal.     Pupils:  Pupils are equal, round, and reactive to light.  Cardiovascular:     Rate and Rhythm: Normal rate and regular rhythm.     Pulses: Normal pulses.     Heart sounds: Normal heart sounds.  Pulmonary:     Effort: Pulmonary effort is normal.  Abdominal:     General: Abdomen is flat.     Palpations: Abdomen is soft.  Musculoskeletal:        General: Swelling present. Normal range of motion.     Cervical back: Normal range of motion.     Right lower leg: No edema.     Left lower leg: No edema.  Skin:    General: Skin is warm and dry.     Capillary Refill: Capillary refill takes less than 2 seconds.  Neurological:     General: No focal deficit present.     Mental Status: She is alert. Mental status is at baseline.  Psychiatric:        Mood and Affect: Mood normal.    -Obtained vitals and reviewed notes and medications.   -Filled one week pill box  -Needs eliquis samples.   -Waiting for [employer] and Lilly applications to be approved.  -[employer] not connected and/or transmitting. Message sent to [facility] for same.   -No chest pain, shortness of breath, swelling or weight gain.   -No missed doses of medications.   -Currently has ear infection being treated with ear drop anti-biotics f/u with ENT today.   -Insulin issues: Zeb Comfort is $137 for a 30 days supply. Novolog is $150 for a 50  days supply.   -Patient cannot afford both insulins so she picked up [place] and is taking half in the morning and half at night. Until she gets approved for denied for the Humalog Lilly patient assistance. Danielle [name] was given a CBG chart to keep up with sugar readings with this adjustment.   -Appointments reviewed, I will see patient in one week.   Refills- NONE       Future Appointments  Date Time Provider [place]  12/02/2020  7:05 AM [facility] DEVICE REMOTES [facility]  02/09/2021 10:15 AM Rankin, Clent Demark, MD [facility] None  03/03/2021  7:05 AM [facility] DEVICE REMOTES [facility]  06/02/2021  7:05 AM [facility] DEVICE REMOTES [facility]  09/01/2021  7:05 AM [facility] DEVICE REMOTES [facility]     ACTION: Home visit completed

## 2020-12-07 ENCOUNTER — Other Ambulatory Visit (HOSPITAL_COMMUNITY): Payer: Self-pay

## 2020-12-07 ENCOUNTER — Telehealth (HOSPITAL_COMMUNITY): Payer: Self-pay | Admitting: *Deleted

## 2020-12-07 ENCOUNTER — Other Ambulatory Visit (HOSPITAL_COMMUNITY): Payer: Self-pay | Admitting: Internal Medicine

## 2020-12-07 NOTE — Progress Notes (Signed)
Paramedicine Encounter    Patient ID: Danielle [name], female    DOB: [birth year], 75 y.o.   MRN: [id number]   Arrived at [facility] for a med rec. She reports she is going to be out of town for the next three weeks and is needing medications for same. I successfully filled two weeks however the third week's box needs refills. I will call in same and Jordynn reports she will put the reminder in her box her self with instructions I left. I will see Jolan in three weeks.  Refills: Spironolactone Carvedilol Potassium Torsemide     ACTION: Home visit completed

## 2020-12-07 NOTE — Telephone Encounter (Signed)
Pt awaiting pt assistance for Eliquis and needs samples, samples provided to pt's community paramedic Kelvin Burpee  Medication Samples have been provided to the patient.  Drug name: Eliquis       Strength: 5 mg        Qty: 2  LOT: [id number]  Exp.Date: 6/24  Dosing instructions: Take 1 tab Twice daily   The patient has been instructed regarding the correct time, dose, and frequency of taking this medication, including desired effects and most common side effects.   Danielle [name] 3:14 PM 12/07/2020

## 2020-12-08 ENCOUNTER — Other Ambulatory Visit (HOSPITAL_COMMUNITY): Payer: Self-pay | Admitting: *Deleted

## 2020-12-21 ENCOUNTER — Telehealth: Payer: Self-pay | Admitting: Internal Medicine

## 2020-12-21 MED ORDER — FREESTYLE LITE TEST VI STRP: 1.0000 | ORAL_STRIP | Freq: Four times a day (QID) | 3 refills | Status: DC

## 2020-12-21 NOTE — Telephone Encounter (Signed)
forwarding

## 2020-12-21 NOTE — Telephone Encounter (Signed)
Made pt an appt to follow up with  01/19/21 @ 1130am , this wasn't listed on her med list nor was in med history. Is this okay to fill ,please advise

## 2020-12-21 NOTE — Telephone Encounter (Signed)
MEDICATION: freestyle lite test strips  PHARMACY:   [facility] ([facility]), [address] Phone:  [phone]  Fax:  [phone]      HAS THE PATIENT CONTACTED THEIR PHARMACY?  yes  IS THIS A 90 DAY SUPPLY : yes  IS PATIENT OUT OF MEDICATION: yes  IF NOT; HOW MUCH IS LEFT:   LAST APPOINTMENT DATE: '@6'$ /16/2022  NEXT APPOINTMENT DATE:'@Visit'$  date not found  DO WE HAVE YOUR PERMISSION TO LEAVE A DETAILED MESSAGE?:   **Let patient know to contact pharmacy at the end of the day to make sure medication is ready. **  ** Please notify patient to allow 48-72 hours to process**  **Encourage patient to contact the pharmacy for refills or they can request refills through [facility]**

## 2020-12-28 ENCOUNTER — Telehealth: Payer: Self-pay | Admitting: Internal Medicine

## 2020-12-28 ENCOUNTER — Other Ambulatory Visit: Payer: Self-pay | Admitting: Internal Medicine

## 2020-12-28 NOTE — Telephone Encounter (Signed)
Pt states that she can not afford her insulin and is wondering what there is she can do for insulin aspart (NOVOLOG FLEXPEN) 100 UNIT/ML FlexPen  Pt is needing a refill on  glucose blood (FREESTYLE LITE) test strip [facility] ([facility]), [place] [facility] [place]

## 2020-12-30 NOTE — Telephone Encounter (Signed)
Patient is on the phone - she left a message on 12/28/20 about not being able to afford her Novolog - she is calling today to find out if we have any samples? she is unable to afford the $150 cost at the pharmacy  - she is currently out of insulin

## 2020-12-30 NOTE — Telephone Encounter (Signed)
Called and spoke with patient to inform her that she is approved for the patient assistance program she will be getting her medication from [facility] . Pt said that she is out of he medication and ask if we had any samples. Informed that we had a sample that someone will need to pick this up before 5 pm before we close.  Spoke w/ daughter on the phone to inform where Our is located and that we close at 5pm.

## 2021-01-02 ENCOUNTER — Other Ambulatory Visit (HOSPITAL_COMMUNITY): Payer: Self-pay

## 2021-01-02 NOTE — Progress Notes (Signed)
Paramedicine Encounter    Patient ID: Danielle [name], female    DOB: 12-19-1945, 75 y.o.   MRN: [id number]   Patient Care Team: Seward Carol, MD as PCP - General (Internal Medicine) Constance Haw, MD as PCP - Electrophysiology (Cardiology) Sueanne Margarita, MD as PCP - Sleep Medicine (Cardiology) Jorge Ny, LCSW as Social Worker (Licensed Clinical Social Worker) Shamleffer, Melanie Crazier, MD as Consulting Physician (Endocrinology)  Patient Active Problem List   Diagnosis Date Noted   Polyneuropathy associated with underlying disease ([place]) 05/04/2020   Severe nonproliferative diabetic retinopathy of left eye, with macular edema, associated with type 2 diabetes mellitus ([place]) 11/05/2019   Severe nonproliferative diabetic retinopathy of right eye, with macular edema, associated with type 2 diabetes mellitus ([place]) 11/05/2019   Retinal hemorrhage of right eye 11/05/2019   Retinal hemorrhage of left eye 11/05/2019   Type 2 diabetes mellitus with hyperglycemia, with long-term current use of insulin ([place]) 05/14/2019   Diabetes mellitus ([place]) 05/14/2019   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, with long-term current use of insulin ([place]) 05/14/2019   Type 2 diabetes mellitus with diabetic polyneuropathy, with long-term current use of insulin ([place]) 05/14/2019   Encephalopathy acute 05/20/2018   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 05/20/2018   Acute kidney failure ([place]) 05/15/2018   Primary localized osteoarthritis of right knee 05/13/2018   (HFpEF) heart failure with preserved ejection fraction ([place]) 12/20/2017   Obesity (BMI 30-39.9) 11/11/2017   OSA (obstructive sleep apnea) 02/18/2017   Essential hypertension    Diabetes mellitus without complication (HCC)    Cancer (HCC)    Thyroid disease     Current Outpatient Medications:    acetaminophen (TYLENOL) 500 MG tablet, Take 500-1,000 mg by mouth every 6 (six) hours as needed (for pain or  headaches)., Disp: , Rfl:    Alcohol Swabs PADS, See admin instructions., Disp: , Rfl:    allopurinol (ZYLOPRIM) 100 MG tablet, 1 tablet (Patient not taking: No sig reported), Disp: , Rfl:    apixaban (ELIQUIS) 5 MG TABS tablet, Take 1 tablet (5 mg total) by mouth 2 (two) times daily., Disp: 60 tablet, Rfl: 11   atorvastatin (LIPITOR) 80 MG tablet, Take 1 tablet (80 mg total) by mouth at bedtime., Disp: 100 tablet, Rfl: 3   Biotin 10 MG TABS, 1 tablet, Disp: , Rfl:    carvedilol (COREG) 25 MG tablet, 1 tablet by mouth 2 times daily with food, Disp: 60 tablet, Rfl: 11   clotrimazole-betamethasone (LOTRISONE) lotion, Apply a small amount twice daily to affected area(s) for 14 days, then use as needed for itch., Disp: , Rfl:    empagliflozin (JARDIANCE) 10 MG TABS tablet, 1 tablet, Disp: , Rfl:    empagliflozin (JARDIANCE) 10 MG TABS tablet, Take 10 mg by mouth daily., Disp: , Rfl:    Ferrous Sulfate (IRON) 325 (65 Fe) MG TABS, Take 1 tablet by mouth daily., Disp: , Rfl:    gabapentin (NEURONTIN) 300 MG capsule, Take 300 mg by mouth 2 (two) times daily., Disp: , Rfl:    glucose blood (FREESTYLE LITE) test strip, 1 each by Other route in the morning, at noon, in the evening, and at bedtime. Use as instructed, Disp: 400 each, Rfl: 3   insulin aspart (NOVOLOG FLEXPEN) 100 UNIT/ML FlexPen, Max daily 30 units per correction scale, Disp: 15 mL, Rfl: 11   insulin degludec (TRESIBA FLEXTOUCH) 100 UNIT/ML FlexTouch Pen, Inject 50 Units into the skin daily., Disp: 30  mL, Rfl: 6   isosorbide mononitrate (IMDUR) 30 MG 24 hr tablet, TAKE 1 TABLET BY MOUTH ONCE DAILY, Disp: 30 tablet, Rfl: 11   latanoprost (XALATAN) 0.005 % ophthalmic solution, 1 drop into affected eye in the evening, Disp: , Rfl:    levothyroxine (SYNTHROID, LEVOTHROID) 75 MCG tablet, Take 75 mcg by mouth daily before breakfast., Disp: , Rfl:    losartan (COZAAR) 50 MG tablet, TAKE 1 TABLET BY MOUTH AT BEDTIME, Disp: 30 tablet, Rfl: 11   LUMIGAN  0.01 % SOLN, , Disp: , Rfl:    Multiple Vitamins-Minerals (ALIVE ONCE DAILY WOMENS 50+ PO), Take 1 tablet by mouth daily., Disp: , Rfl:    mupirocin ointment (BACTROBAN) 2 %, Apply to left great toe once daily for 14 days., Disp: 30 g, Rfl: 1   nitroGLYCERIN (NITROSTAT) 0.4 MG SL tablet, DISSOLVE ONE TABLET UNDER THE TONGUE EVERY 5 MINUTES AS NEEDED FOR CHEST PAIN., Disp: 25 tablet, Rfl: 3   Omega-3 Fatty Acids (FISH OIL PO), Take 1 capsule by mouth daily., Disp: , Rfl:    potassium chloride (KLOR-CON) 10 MEQ tablet, Take 2 tablets by mouth once daily, Disp: 60 tablet, Rfl: 11   RELION PEN NEEDLE 31G/8MM 31G X 8 MM MISC, USE 1 THREE TIMES DAILY AS DIRECTED, Disp: , Rfl:    spironolactone (ALDACTONE) 25 MG tablet, Take 1 tablet (25 mg total) by mouth daily., Disp: 30 tablet, Rfl: 11   torsemide (DEMADEX) 20 MG tablet, Take 2 tablets (40 mg total) by mouth daily., Disp: 60 tablet, Rfl: 11   Travoprost, BAK Free, (TRAVATAN) 0.004 % SOLN ophthalmic solution, Place 1 drop into both eyes at bedtime. , Disp: , Rfl:  Allergies  Allergen Reactions   Other Other (See Comments)    Blood products- Patient won't accept any Other reaction(s): rash   Sulfa Antibiotics Rash   Uloric [Febuxostat] Rash     Social History   Socioeconomic History   Marital status: Widowed    Spouse name: Not on file   Number of children: 3   Years of education: Not on file   Highest education level: Some college, no degree  Occupational History   Occupation: retired  Tobacco Use   Smoking status: Former    Packs/day: 1.00    Years: 22.00    Pack years: 22.00    Types: Cigarettes    Quit date: 1983    Years since quitting: 39.6   Smokeless tobacco: Never  Vaping Use   Vaping Use: Never used  Substance and Sexual Activity   Alcohol use: Yes    Comment: 05/22/2018 "glass of wine couple times/wk"   Drug use: Not Currently   Sexual activity: Not Currently    Birth control/protection: None, Post-menopausal   Other Topics Concern   Not on file  Social History Narrative   Not on file   Social Determinants of Health   Financial Resource Strain: Not on file  Food Insecurity: Not on file  Transportation Needs: Not on file  Physical Activity: Not on file  Stress: Not on file  Social Connections: Not on file  Intimate Partner Violence: Not on file    Physical Exam Vitals reviewed.  Constitutional:      Appearance: Normal appearance. She is normal weight.  HENT:     Head: Normocephalic.     Nose: Nose normal.     Mouth/Throat:     Mouth: Mucous membranes are moist.     Pharynx: Oropharynx is clear.  Eyes:  Conjunctiva/sclera: Conjunctivae normal.     Pupils: Pupils are equal, round, and reactive to light.  Cardiovascular:     Rate and Rhythm: Normal rate and regular rhythm.     Pulses: Normal pulses.     Heart sounds: Normal heart sounds.  Abdominal:     General: Abdomen is flat.     Palpations: Abdomen is soft.  Musculoskeletal:        General: Swelling present. Normal range of motion.     Cervical back: Normal range of motion.     Right lower leg: Edema present.     Left lower leg: Edema present.  Skin:    General: Skin is warm and dry.     Capillary Refill: Capillary refill takes less than 2 seconds.  Neurological:     General: No focal deficit present.     Mental Status: She is alert. Mental status is at baseline.  Psychiatric:        Mood and Affect: Mood normal.   Arrived for home visit for Fertile who was alert and oriented reporting to be feeling okay. Brekyn reports she has been without test strips to test her sugar so she is unsure how it's running. She has been taking [place] but not Novolog. We are waiting for delivery of same from [employer] in which she was approved for. Vitals were obtained. CBG- 322 (no insulin today) I reviewed medications and filled one pill box.   Pill box missing potassium. (2 tabs once daily) Shagun denied chest pain,  shortness of breath, dizziness or trouble sleeping or taking her meds.   Jina agreed to home visit in one week.    Refills needed: Potassium Isosorbide Eliquis (samples/ check on application status)  (was submitted around 11/17/20)      Future Appointments  Date Time Provider [place]  01/19/2021 11:30 AM Shamleffer, Melanie Crazier, MD [facility] None  02/09/2021 10:15 AM Rankin, Clent Demark, MD [facility] None  03/03/2021  7:05 AM [facility] DEVICE REMOTES [facility]  06/02/2021  7:05 AM [facility] DEVICE REMOTES [facility]  09/01/2021  7:05 AM [facility] DEVICE REMOTES [facility]     ACTION: Home visit completed

## 2021-01-04 ENCOUNTER — Telehealth (HOSPITAL_COMMUNITY): Payer: Self-pay

## 2021-01-04 NOTE — Telephone Encounter (Signed)
Danielle [name] reached out and reported she received her insulin via [employer] in the mail. I will follow up next week.

## 2021-01-09 ENCOUNTER — Other Ambulatory Visit (HOSPITAL_COMMUNITY): Payer: Self-pay

## 2021-01-09 ENCOUNTER — Telehealth: Payer: Self-pay | Admitting: Internal Medicine

## 2021-01-09 NOTE — Progress Notes (Signed)
Paramedicine Encounter    Patient ID: Danielle [name], female    DOB: 1946/03/30, 75 y.o.   MRN: [id number]   Patient Care Team: Seward Carol, MD as PCP - General (Internal Medicine) Constance Haw, MD as PCP - Electrophysiology (Cardiology) Sueanne Margarita, MD as PCP - Sleep Medicine (Cardiology) Jorge Ny, LCSW as Social Worker (Licensed Clinical Social Worker) Shamleffer, Melanie Crazier, MD as Consulting Physician (Endocrinology)  Patient Active Problem List   Diagnosis Date Noted   Polyneuropathy associated with underlying disease ([place]) 05/04/2020   Severe nonproliferative diabetic retinopathy of left eye, with macular edema, associated with type 2 diabetes mellitus ([place]) 11/05/2019   Severe nonproliferative diabetic retinopathy of right eye, with macular edema, associated with type 2 diabetes mellitus ([place]) 11/05/2019   Retinal hemorrhage of right eye 11/05/2019   Retinal hemorrhage of left eye 11/05/2019   Type 2 diabetes mellitus with hyperglycemia, with long-term current use of insulin ([place]) 05/14/2019   Diabetes mellitus ([place]) 05/14/2019   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, with long-term current use of insulin ([place]) 05/14/2019   Type 2 diabetes mellitus with diabetic polyneuropathy, with long-term current use of insulin ([place]) 05/14/2019   Encephalopathy acute 05/20/2018   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 05/20/2018   Acute kidney failure ([place]) 05/15/2018   Primary localized osteoarthritis of right knee 05/13/2018   (HFpEF) heart failure with preserved ejection fraction ([place]) 12/20/2017   Obesity (BMI 30-39.9) 11/11/2017   OSA (obstructive sleep apnea) 02/18/2017   Essential hypertension    Diabetes mellitus without complication (HCC)    Cancer (HCC)    Thyroid disease     Current Outpatient Medications:    acetaminophen (TYLENOL) 500 MG tablet, Take 500-1,000 mg by mouth every 6 (six) hours as needed (for pain or  headaches)., Disp: , Rfl:    Alcohol Swabs PADS, See admin instructions., Disp: , Rfl:    allopurinol (ZYLOPRIM) 100 MG tablet, 1 tablet (Patient not taking: No sig reported), Disp: , Rfl:    apixaban (ELIQUIS) 5 MG TABS tablet, Take 1 tablet (5 mg total) by mouth 2 (two) times daily., Disp: 60 tablet, Rfl: 11   atorvastatin (LIPITOR) 80 MG tablet, Take 1 tablet (80 mg total) by mouth at bedtime., Disp: 100 tablet, Rfl: 3   Biotin 10 MG TABS, 1 tablet, Disp: , Rfl:    carvedilol (COREG) 25 MG tablet, 1 tablet by mouth 2 times daily with food, Disp: 60 tablet, Rfl: 11   clotrimazole-betamethasone (LOTRISONE) lotion, Apply a small amount twice daily to affected area(s) for 14 days, then use as needed for itch., Disp: , Rfl:    empagliflozin (JARDIANCE) 10 MG TABS tablet, 1 tablet, Disp: , Rfl:    empagliflozin (JARDIANCE) 10 MG TABS tablet, Take 10 mg by mouth daily., Disp: , Rfl:    Ferrous Sulfate (IRON) 325 (65 Fe) MG TABS, Take 1 tablet by mouth daily., Disp: , Rfl:    gabapentin (NEURONTIN) 300 MG capsule, Take 300 mg by mouth 2 (two) times daily., Disp: , Rfl:    glucose blood (FREESTYLE LITE) test strip, 1 each by Other route in the morning, at noon, in the evening, and at bedtime. Use as instructed, Disp: 400 each, Rfl: 3   insulin aspart (NOVOLOG FLEXPEN) 100 UNIT/ML FlexPen, Max daily 30 units per correction scale (Patient not taking: Reported on 01/02/2021), Disp: 15 mL, Rfl: 11   insulin degludec (TRESIBA FLEXTOUCH) 100 UNIT/ML FlexTouch Pen, Inject 50 Units  into the skin daily., Disp: 30 mL, Rfl: 6   isosorbide mononitrate (IMDUR) 30 MG 24 hr tablet, TAKE 1 TABLET BY MOUTH ONCE DAILY, Disp: 30 tablet, Rfl: 11   latanoprost (XALATAN) 0.005 % ophthalmic solution, 1 drop into affected eye in the evening, Disp: , Rfl:    levothyroxine (SYNTHROID, LEVOTHROID) 75 MCG tablet, Take 75 mcg by mouth daily before breakfast., Disp: , Rfl:    losartan (COZAAR) 50 MG tablet, TAKE 1 TABLET BY MOUTH AT  BEDTIME, Disp: 30 tablet, Rfl: 11   LUMIGAN 0.01 % SOLN, , Disp: , Rfl:    Multiple Vitamins-Minerals (ALIVE ONCE DAILY WOMENS 50+ PO), Take 1 tablet by mouth daily., Disp: , Rfl:    mupirocin ointment (BACTROBAN) 2 %, Apply to left great toe once daily for 14 days., Disp: 30 g, Rfl: 1   nitroGLYCERIN (NITROSTAT) 0.4 MG SL tablet, DISSOLVE ONE TABLET UNDER THE TONGUE EVERY 5 MINUTES AS NEEDED FOR CHEST PAIN., Disp: 25 tablet, Rfl: 3   Omega-3 Fatty Acids (FISH OIL PO), Take 1 capsule by mouth daily., Disp: , Rfl:    potassium chloride (KLOR-CON) 10 MEQ tablet, Take 2 tablets by mouth once daily, Disp: 60 tablet, Rfl: 11   RELION PEN NEEDLE 31G/8MM 31G X 8 MM MISC, USE 1 THREE TIMES DAILY AS DIRECTED, Disp: , Rfl:    spironolactone (ALDACTONE) 25 MG tablet, Take 1 tablet (25 mg total) by mouth daily., Disp: 30 tablet, Rfl: 11   torsemide (DEMADEX) 20 MG tablet, Take 2 tablets (40 mg total) by mouth daily., Disp: 60 tablet, Rfl: 11   Travoprost, BAK Free, (TRAVATAN) 0.004 % SOLN ophthalmic solution, Place 1 drop into both eyes at bedtime. , Disp: , Rfl:  Allergies  Allergen Reactions   Other Other (See Comments)    Blood products- Patient won't accept any Other reaction(s): rash   Sulfa Antibiotics Rash   Uloric [Febuxostat] Rash     Social History   Socioeconomic History   Marital status: Widowed    Spouse name: Not on file   Number of children: 3   Years of education: Not on file   Highest education level: Some college, no degree  Occupational History   Occupation: retired  Tobacco Use   Smoking status: Former    Packs/day: 1.00    Years: 22.00    Pack years: 22.00    Types: Cigarettes    Quit date: 1983    Years since quitting: 39.6   Smokeless tobacco: Never  Vaping Use   Vaping Use: Never used  Substance and Sexual Activity   Alcohol use: Yes    Comment: 05/22/2018 "glass of wine couple times/wk"   Drug use: Not Currently   Sexual activity: Not Currently    Birth  control/protection: None, Post-menopausal  Other Topics Concern   Not on file  Social History Narrative   Not on file   Social Determinants of Health   Financial Resource Strain: Not on file  Food Insecurity: Not on file  Transportation Needs: Not on file  Physical Activity: Not on file  Stress: Not on file  Social Connections: Not on file  Intimate Partner Violence: Not on file    Physical Exam Vitals reviewed.  Constitutional:      Appearance: Danielle [name] is normal weight.  HENT:     Head: Normocephalic.     Nose: Nose normal.     Mouth/Throat:     Mouth: Mucous membranes are moist.     Pharynx: Oropharynx  is clear.  Eyes:     Pupils: Pupils are equal, round, and reactive to light.  Cardiovascular:     Rate and Rhythm: Normal rate and regular rhythm.     Pulses: Normal pulses.     Heart sounds: Normal heart sounds.  Pulmonary:     Effort: Pulmonary effort is normal.     Breath sounds: Normal breath sounds.  Abdominal:     General: Abdomen is flat.     Palpations: Abdomen is soft.  Musculoskeletal:        General: Swelling present. Normal range of motion.     Cervical back: Normal range of motion.     Right lower leg: Edema present.     Left lower leg: Edema present.  Skin:    General: Skin is warm and dry.     Capillary Refill: Capillary refill takes less than 2 seconds.  Neurological:     General: No focal deficit present.     Mental Status: Danielle [name] is alert. Mental status is at baseline.  Psychiatric:        Mood and Affect: Mood normal.   Arrived for home visit for Carl who was alert and oriented reporting to be feeling good with no complaints. Danielle [name] denied dizziness, chest pain, shortness of breath.  Danielle [name] reports Danielle [name] receive her insulin through the [employer] Patient assistance program on Friday and has successfully been taking same. I will add Humalog back to her list.   HUMALOGKWIK PEN 100/ML 15 ML PK 25 UNITS WITH MEALS (SLIDING SCALE)   I  reviewed medications. Pill box filled accordingly.   Danielle [name] has not received Eliquis yet from [facility]. We submitted application to HF clinic in June. I will follow up with E. Hines with HF clinic and obtain samples.  (Thurs AM- Monday AM)  Danielle [name] and I called her Endocrinologist because her glucometer is broken so her insurance sent her a voucher for a free meter with strips as long as RX was sent to her pharmacy so we ensured Endocrinologist would send over RX for same.   We reviewed appointments and confirmed same.   Home visit complete. [name].   Refills: Levothyroxine Torsemide   CBG- 218    Future Appointments  Date Time Provider [place]  01/19/2021 11:30 AM Shamleffer, Melanie Crazier, MD [facility] None  02/09/2021 10:15 AM Rankin, Clent Demark, MD [facility] None  03/03/2021  7:05 AM [facility] DEVICE REMOTES [facility]  06/02/2021  7:05 AM [facility] DEVICE REMOTES [facility]  09/01/2021  7:05 AM [facility] DEVICE REMOTES [facility]     ACTION: Home visit completed

## 2021-01-09 NOTE — Telephone Encounter (Signed)
Pt voiced that her meter is currently broken. Insurance gave pt a voucher, for one touch verio reflect as well as the test strips . Pt's is needing a prescription sent in for this so she can use her voucher.   [place] (SE), [place] - [place]

## 2021-01-10 ENCOUNTER — Other Ambulatory Visit: Payer: Self-pay

## 2021-01-10 ENCOUNTER — Telehealth (HOSPITAL_COMMUNITY): Payer: Self-pay

## 2021-01-10 ENCOUNTER — Telehealth: Payer: Self-pay | Admitting: Internal Medicine

## 2021-01-10 DIAGNOSIS — Z794 Long term (current) use of insulin: Secondary | ICD-10-CM

## 2021-01-10 DIAGNOSIS — E1165 Type 2 diabetes mellitus with hyperglycemia: Secondary | ICD-10-CM

## 2021-01-10 DIAGNOSIS — E1142 Type 2 diabetes mellitus with diabetic polyneuropathy: Secondary | ICD-10-CM

## 2021-01-10 MED ORDER — ONETOUCH VERIO VI STRP: ORAL_STRIP | 12 refills | Status: DC

## 2021-01-10 MED ORDER — ONETOUCH VERIO W/DEVICE KIT: PACK | 0 refills | Status: DC

## 2021-01-10 MED ORDER — ONETOUCH ULTRASOFT LANCETS MISC: 12 refills | Status: DC

## 2021-01-10 MED ORDER — ONETOUCH VERIO W/DEVICE KIT: PACK | 0 refills | Status: AC

## 2021-01-10 NOTE — Telephone Encounter (Signed)
Danielle [name]- [place] is requesting  lancets as well as how many times using a day for the patient on the prescription . All of it needs to be resent as well as the meter and test strips

## 2021-01-10 NOTE — Telephone Encounter (Signed)
error 

## 2021-01-10 NOTE — Telephone Encounter (Signed)
This was corrected and sent to the pharmacy

## 2021-01-10 NOTE — Telephone Encounter (Signed)
Spoke to Danielle [name] and advised her we will need to reapply for the Eliquis patient assistance program. She understood and will get a new print out of her copay expenses from [place]. She said she can afford this month's supply once she gets paid on the 24th.She will retrieve the copay expenses when she pays for the 30 days on the 24th. I will get her some samples from the clinic to last her until she can get the 30 day supply. [name] understood and agreed with plan.

## 2021-01-10 NOTE — Telephone Encounter (Signed)
Sent to the [facility]

## 2021-01-16 ENCOUNTER — Other Ambulatory Visit (HOSPITAL_COMMUNITY): Payer: Self-pay

## 2021-01-16 NOTE — Progress Notes (Signed)
Paramedicine Encounter    Patient ID: Danielle [name], female    DOB: Nov 17, 1945, 75 y.o.   MRN: [id number]   Patient Care Team: Seward Carol, MD as PCP - General (Internal Medicine) Constance Haw, MD as PCP - Electrophysiology (Cardiology) Sueanne Margarita, MD as PCP - Sleep Medicine (Cardiology) Jorge Ny, LCSW as Social Worker (Licensed Clinical Social Worker) Shamleffer, Melanie Crazier, MD as Consulting Physician (Endocrinology)  Patient Active Problem List   Diagnosis Date Noted   Polyneuropathy associated with underlying disease ([place]) 05/04/2020   Severe nonproliferative diabetic retinopathy of left eye, with macular edema, associated with type 2 diabetes mellitus ([place]) 11/05/2019   Severe nonproliferative diabetic retinopathy of right eye, with macular edema, associated with type 2 diabetes mellitus ([place]) 11/05/2019   Retinal hemorrhage of right eye 11/05/2019   Retinal hemorrhage of left eye 11/05/2019   Type 2 diabetes mellitus with hyperglycemia, with long-term current use of insulin ([place]) 05/14/2019   Diabetes mellitus ([place]) 05/14/2019   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, with long-term current use of insulin ([place]) 05/14/2019   Type 2 diabetes mellitus with diabetic polyneuropathy, with long-term current use of insulin ([place]) 05/14/2019   Encephalopathy acute 05/20/2018   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 05/20/2018   Acute kidney failure ([place]) 05/15/2018   Primary localized osteoarthritis of right knee 05/13/2018   (HFpEF) heart failure with preserved ejection fraction ([place]) 12/20/2017   Obesity (BMI 30-39.9) 11/11/2017   OSA (obstructive sleep apnea) 02/18/2017   Essential hypertension    Diabetes mellitus without complication (HCC)    Cancer (HCC)    Thyroid disease     Current Outpatient Medications:    acetaminophen (TYLENOL) 500 MG tablet, Take 500-1,000 mg by mouth every 6 (six) hours as needed (for pain or  headaches)., Disp: , Rfl:    Alcohol Swabs PADS, See admin instructions., Disp: , Rfl:    allopurinol (ZYLOPRIM) 100 MG tablet, 1 tablet, Disp: , Rfl:    apixaban (ELIQUIS) 5 MG TABS tablet, Take 1 tablet (5 mg total) by mouth 2 (two) times daily., Disp: 60 tablet, Rfl: 11   atorvastatin (LIPITOR) 80 MG tablet, Take 1 tablet (80 mg total) by mouth at bedtime., Disp: 100 tablet, Rfl: 3   Biotin 10 MG TABS, 1 tablet, Disp: , Rfl:    Blood Glucose Monitoring Suppl (ONETOUCH VERIO) w/Device KIT, Use to check blood sugar, Disp: 1 kit, Rfl: 0   carvedilol (COREG) 25 MG tablet, 1 tablet by mouth 2 times daily with food, Disp: 60 tablet, Rfl: 11   clotrimazole-betamethasone (LOTRISONE) lotion, Apply a small amount twice daily to affected area(s) for 14 days, then use as needed for itch., Disp: , Rfl:    empagliflozin (JARDIANCE) 10 MG TABS tablet, 1 tablet, Disp: , Rfl:    empagliflozin (JARDIANCE) 10 MG TABS tablet, Take 10 mg by mouth daily., Disp: , Rfl:    Ferrous Sulfate (IRON) 325 (65 Fe) MG TABS, Take 1 tablet by mouth daily., Disp: , Rfl:    gabapentin (NEURONTIN) 300 MG capsule, Take 300 mg by mouth 2 (two) times daily., Disp: , Rfl:    glucose blood (FREESTYLE LITE) test strip, 1 each by Other route in the morning, at noon, in the evening, and at bedtime. Use as instructed, Disp: 400 each, Rfl: 3   glucose blood (ONETOUCH VERIO) test strip, Use to test blood  sugar 4 times a day, Disp: 100 each, Rfl: 12   insulin  aspart (NOVOLOG FLEXPEN) 100 UNIT/ML FlexPen, Max daily 30 units per correction scale, Disp: 15 mL, Rfl: 11   insulin degludec (TRESIBA FLEXTOUCH) 100 UNIT/ML FlexTouch Pen, Inject 50 Units into the skin daily., Disp: 30 mL, Rfl: 6   isosorbide mononitrate (IMDUR) 30 MG 24 hr tablet, TAKE 1 TABLET BY MOUTH ONCE DAILY, Disp: 30 tablet, Rfl: 11   Lancets (ONETOUCH ULTRASOFT) lancets, Use as  to test blood sugar  4 times a day, Disp: 100 each, Rfl: 12   latanoprost (XALATAN) 0.005 %  ophthalmic solution, 1 drop into affected eye in the evening, Disp: , Rfl:    levothyroxine (SYNTHROID, LEVOTHROID) 75 MCG tablet, Take 75 mcg by mouth daily before breakfast., Disp: , Rfl:    losartan (COZAAR) 50 MG tablet, TAKE 1 TABLET BY MOUTH AT BEDTIME, Disp: 30 tablet, Rfl: 11   LUMIGAN 0.01 % SOLN, , Disp: , Rfl:    Multiple Vitamins-Minerals (ALIVE ONCE DAILY WOMENS 50+ PO), Take 1 tablet by mouth daily., Disp: , Rfl:    mupirocin ointment (BACTROBAN) 2 %, Apply to left great toe once daily for 14 days., Disp: 30 g, Rfl: 1   nitroGLYCERIN (NITROSTAT) 0.4 MG SL tablet, DISSOLVE ONE TABLET UNDER THE TONGUE EVERY 5 MINUTES AS NEEDED FOR CHEST PAIN., Disp: 25 tablet, Rfl: 3   Omega-3 Fatty Acids (FISH OIL PO), Take 1 capsule by mouth daily., Disp: , Rfl:    potassium chloride (KLOR-CON) 10 MEQ tablet, Take 2 tablets by mouth once daily, Disp: 60 tablet, Rfl: 11   RELION PEN NEEDLE 31G/8MM 31G X 8 MM MISC, USE 1 THREE TIMES DAILY AS DIRECTED (Patient not taking: Reported on 01/09/2021), Disp: , Rfl:    spironolactone (ALDACTONE) 25 MG tablet, Take 1 tablet (25 mg total) by mouth daily., Disp: 30 tablet, Rfl: 11   torsemide (DEMADEX) 20 MG tablet, Take 2 tablets (40 mg total) by mouth daily., Disp: 60 tablet, Rfl: 11   Travoprost, BAK Free, (TRAVATAN) 0.004 % SOLN ophthalmic solution, Place 1 drop into both eyes at bedtime. , Disp: , Rfl:  Allergies  Allergen Reactions   Other Other (See Comments)    Blood products- Patient won't accept any Other reaction(s): rash   Sulfa Antibiotics Rash   Uloric [Febuxostat] Rash     Social History   Socioeconomic History   Marital status: Widowed    Spouse name: Not on file   Number of children: 3   Years of education: Not on file   Highest education level: Some college, no degree  Occupational History   Occupation: retired  Tobacco Use   Smoking status: Former    Packs/day: 1.00    Years: 22.00    Pack years: 22.00    Types: Cigarettes     Quit date: 1983    Years since quitting: 39.6   Smokeless tobacco: Never  Vaping Use   Vaping Use: Never used  Substance and Sexual Activity   Alcohol use: Yes    Comment: 05/22/2018 "glass of wine couple times/wk"   Drug use: Not Currently   Sexual activity: Not Currently    Birth control/protection: None, Post-menopausal  Other Topics Concern   Not on file  Social History Narrative   Not on file   Social Determinants of Health   Financial Resource Strain: Not on file  Food Insecurity: Not on file  Transportation Needs: Not on file  Physical Activity: Not on file  Stress: Not on file  Social Connections: Not on file  Intimate Partner Violence: Not on file    Physical Exam   Saw [place] in the home where she denied shortness of breath, chest pain, dizziness or trouble taking her medications.   Medications were reviewed and confirmed.   Vitals and assessment as noted.   I filled one pill box.   Zan and I reviewed appointments as she needs f/u with HF clinic. I will get this scheduled.   Medtr[employer] remote device still not working, I will have her reach out to manufacture and the device clinic.   I will see Dannetta in one week.   NO REFILLS    Future Appointments  Date Time Provider [place]  01/19/2021 11:30 AM Shamleffer, Melanie Crazier, MD [facility] None  02/09/2021 10:15 AM Rankin, Clent Demark, MD [facility] None  03/03/2021  7:05 AM [facility] DEVICE REMOTES [facility]  06/02/2021  7:05 AM [facility] DEVICE REMOTES [facility]  09/01/2021  7:05 AM [facility] DEVICE REMOTES [facility]     ACTION: Home visit completed

## 2021-01-19 ENCOUNTER — Encounter: Payer: Self-pay | Admitting: Internal Medicine

## 2021-01-19 ENCOUNTER — Other Ambulatory Visit: Payer: Self-pay

## 2021-01-19 ENCOUNTER — Ambulatory Visit (INDEPENDENT_AMBULATORY_CARE_PROVIDER_SITE_OTHER): Payer: Medicare HMO | Admitting: Internal Medicine

## 2021-01-19 VITALS — BP 106/62 | HR 71 | Ht 64.0 in | Wt 226.0 lb

## 2021-01-19 DIAGNOSIS — E1159 Type 2 diabetes mellitus with other circulatory complications: Secondary | ICD-10-CM

## 2021-01-19 DIAGNOSIS — E1165 Type 2 diabetes mellitus with hyperglycemia: Secondary | ICD-10-CM

## 2021-01-19 DIAGNOSIS — Z794 Long term (current) use of insulin: Secondary | ICD-10-CM | POA: Diagnosis not present

## 2021-01-19 DIAGNOSIS — E113599 Type 2 diabetes mellitus with proliferative diabetic retinopathy without macular edema, unspecified eye: Secondary | ICD-10-CM | POA: Diagnosis not present

## 2021-01-19 DIAGNOSIS — E1142 Type 2 diabetes mellitus with diabetic polyneuropathy: Secondary | ICD-10-CM

## 2021-01-19 LAB — POCT GLYCOSYLATED HEMOGLOBIN (HGB A1C): Hemoglobin A1C: 8.8 % — AB (ref 4.0–5.6)

## 2021-01-19 LAB — POCT GLUCOSE (DEVICE FOR HOME USE): Glucose Fasting, POC: 77 mg/dL (ref 70–99)

## 2021-01-19 MED ORDER — DEXCOM G6 SENSOR MISC: 1.0000 | 3 refills | Status: DC

## 2021-01-19 MED ORDER — DEXCOM G6 TRANSMITTER MISC: 1.0000 | 3 refills | Status: DC

## 2021-01-19 NOTE — Progress Notes (Signed)
Name: Danielle [name]  Age/ Sex: 75 y.o., female   MRN/ DOB: [id number], [birth year]     PCP: Seward Carol, MD   Reason for Endocrinology Evaluation: Type 2 Diabetes Mellitus  Initial Endocrine Consultative Visit: 05/14/2019    PATIENT IDENTIFIER: Danielle [name] is a 75 y.o. female with a past medical history of T2DM, HTN, Dyslipidemia, CAD, Hx of Breast Ca (S/P chemo and lumpectomy)  . The patient has followed with Endocrinology clinic since 05/14/2019 for consultative assistance with management of her diabetes.  DIABETIC HISTORY:  Danielle [name] was diagnosed with T2DM > 20 yrs ago. Has been on  Metformin in the past which caused diarrhea, has been on prandial insulin in the past  . Her hemoglobin A1c has ranged from 6.9% in 2019, peaking at 8.0% in 2020.  On her initial visit to our clinic her A1c was 11.2% . She was on Jardiance and Lantus 130 units daily. I reduced her lantus to 80 units, added prandial insulin.   Jardriance initiated  By cardiology 09/2019  SUBJECTIVE:   During the last visit (07/27/2020): A1c 7.1%. We continued jardiance and adjusted MDI regimen.     Today (01/19/2021): Danielle [name] is here for a follow up on diabetes management.  She  has not had a glucose meter in 2 weeks, just received The patient has had hypoglycemic episodes since the last clinic visit. She is symptomatic with these episodes.   She has issues with ears and sinus issues  Has chronic pain in the neck and shoulder   She has one touch verio    HUmalog 28 units with each meal   HOME DIABETES REGIMEN:  Tresiba 45 units daily - taking 78 units  Jardiance 10 mg daily  CF: Humalog ( BG - 150/20)       Statin: Yes ACE-I/ARB:Yes   METER DOWNLOAD SUMMARY: 8/11/-01/19/2021  Average Number Tests/Day = 0.2 Overall Mean FS Glucose = 182 Standard Deviation = 52  BG Ranges: Low = 129 High = 233  Hypoglycemic Events/30 Days: BG < 50 = 0 Episodes of symptomatic severe hypoglycemia =  0    DIABETIC COMPLICATIONS: Microvascular complications:  Neuropathy, CKD III, 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  (S/P laser)  Last eye exam: Completed 07/06/2020    Macrovascular complications:  CAD ( S/P MI and stent placement) Denies: PVD, CVA     HISTORY:  Past Medical History:  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[place]) 02/05/2017   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 05/20/2018   Acute kidney failure ([place]) 05/15/2018   Arthritis    "all over" (05/22/2018)   Blood dyscrasia    per pt-has small blood cells-appears as if anemic   Breast cancer, left breast ([place]) 1985   CHF (congestive heart failure) (HCC)    Complication of anesthesia    difficult to awaken from per pt   Coronary artery disease    Dyspnea    Essential hypertension    Heart disease    Heart murmur    History of gout    Hypothyroidism    Obesity (BMI 30-39.9) 11/11/2017   OSA (obstructive sleep apnea) 02/18/2017    severe obstructive sleep apnea with an AHI of 75.6/h and mild central sleep apnea with a CAI of 7.7/h.  Oxygen saturations dropped as low as 82%.   He is on CPAP at 9 cm H2O.   OSA on CPAP  Personal history of chemotherapy    Personal history of radiation therapy    Presence of permanent cardiac pacemaker    Primary localized osteoarthritis of right knee 05/13/2018   Refusal of blood transfusions as patient is Jehovah's Witness    Type II diabetes mellitus ([place])    Past Surgical History:  Past Surgical History:  Procedure Laterality Date   BREAST BIOPSY Left 1985   BREAST LUMPECTOMY Left 1985   BREAST LUMPECTOMY WITH NEEDLE LOCALIZATION AND AXILLARY LYMPH NODE DISSECTION  1985   CATARACT EXTRACTION W/ INTRAOCULAR LENS  IMPLANT, BILATERAL Bilateral    CORONARY ANGIOPLASTY WITH STENT PLACEMENT  2010, 2012,2017    2 done 2010 and 1 replaced 2012 and 2 replaced in 2017   [facility] / REPLACE / REMOVE PACEMAKER  07/19/2014   JOINT REPLACEMENT     REPLACEMENT TOTAL KNEE Left 09/2012   THYROIDECTOMY, PARTIAL  1978   TONSILLECTOMY     TOTAL KNEE ARTHROPLASTY Right 05/13/2018   Procedure: TOTAL KNEE ARTHROPLASTY;  Surgeon: Marchia Bond, MD;  Location: [facility];  Service: Orthopedics;  Laterality: Right;   TUBAL LIGATION     Social History:  reports that she quit smoking about 39 years ago. Her smoking use included cigarettes. She has a 22.00 pack-year smoking history. She has never used smokeless tobacco. She reports current alcohol use. She reports that she does not currently use drugs. Family History:  Family History  Problem Relation Age of Onset   Multiple myeloma Mother    Heart disease Father    Other Sister    Heart disease Brother    Diabetes Sister    Other Sister      HOME MEDICATIONS: Allergies as of 01/19/2021       Reactions   Other Other (See Comments)   Blood products- Patient won't accept any Other reaction(s): rash   Sulfa Antibiotics Rash   Uloric [febuxostat] Rash        Medication List        Accurate as of January 19, 2021  7:00 AM. If you have any questions, ask your nurse or doctor.          acetaminophen 500 MG tablet Commonly known as: TYLENOL Take 500-1,000 mg by mouth every 6 (six) hours as needed (for pain or headaches).   Alcohol Swabs Pads See admin instructions.   ALIVE ONCE DAILY WOMENS 50+ PO Take 1 tablet by mouth daily.   allopurinol 100 MG tablet Commonly known as: ZYLOPRIM 1 tablet   atorvastatin 80 MG tablet Commonly known as: LIPITOR Take 1 tablet (80 mg total) by mouth at bedtime.   Biotin 10 MG Tabs 1 tablet   carvedilol 25 MG tablet Commonly known as: COREG 1 tablet by mouth 2 times daily with food   clotrimazole-betamethasone lotion Commonly known as: LOTRISONE Apply a small amount twice daily to affected area(s) for 14 days, then use as needed for itch.   Eliquis  5 MG Tabs tablet Generic drug: apixaban Take 1 tablet (5 mg total) by mouth 2 (two) times daily.   empagliflozin 10 MG Tabs tablet Commonly known as: JARDIANCE 1 tablet   empagliflozin 10 MG Tabs tablet Commonly known as: JARDIANCE Take 10 mg by mouth daily.   FISH OIL PO Take 1 capsule by mouth daily.   FREESTYLE LITE test strip Generic drug: glucose blood 1 each by Other route in the morning, at noon, in the evening, and  at bedtime. Use as instructed   OneTouch Verio test strip Generic drug: glucose blood Use to test blood  sugar 4 times a day   gabapentin 300 MG capsule Commonly known as: NEURONTIN Take 300 mg by mouth 2 (two) times daily.   Iron 325 (65 Fe) MG Tabs Take 1 tablet by mouth daily.   isosorbide mononitrate 30 MG 24 hr tablet Commonly known as: IMDUR TAKE 1 TABLET BY MOUTH ONCE DAILY   latanoprost 0.005 % ophthalmic solution Commonly known as: XALATAN 1 drop into affected eye in the evening   levothyroxine 75 MCG tablet Commonly known as: SYNTHROID Take 75 mcg by mouth daily before breakfast.   losartan 50 MG tablet Commonly known as: COZAAR TAKE 1 TABLET BY MOUTH AT BEDTIME   Lumigan 0.01 % Soln Generic drug: bimatoprost   mupirocin ointment 2 % Commonly known as: BACTROBAN Apply to left great toe once daily for 14 days.   nitroGLYCERIN 0.4 MG SL tablet Commonly known as: NITROSTAT DISSOLVE ONE TABLET UNDER THE TONGUE EVERY 5 MINUTES AS NEEDED FOR CHEST PAIN.   NovoLOG FlexPen 100 UNIT/ML FlexPen Generic drug: insulin aspart Max daily 30 units per correction scale   onetouch ultrasoft lancets Use as  to test blood sugar  4 times a day   OneTouch Verio w/Device Kit Use to check blood sugar   potassium chloride 10 MEQ tablet Commonly known as: KLOR-CON Take 2 tablets by mouth once daily   RELION PEN NEEDLE 31G/8MM 31G X 8 MM Misc Generic drug: Insulin Pen Needle USE 1 THREE TIMES DAILY AS DIRECTED   spironolactone 25 MG  tablet Commonly known as: ALDACTONE Take 1 tablet (25 mg total) by mouth daily.   torsemide 20 MG tablet Commonly known as: DEMADEX Take 2 tablets (40 mg total) by mouth daily.   Travoprost (BAK Free) 0.004 % Soln ophthalmic solution Commonly known as: TRAVATAN Place 1 drop into both eyes at bedtime.   Tyler Aas FlexTouch 100 UNIT/ML FlexTouch Pen Generic drug: insulin degludec Inject 50 Units into the skin daily.         OBJECTIVE:   Vital Signs: BP 106/62   Pulse 71   Ht _0  (1.626 m)   Wt 226 lb (102.5 kg)   SpO2 96%   BMI 38.79 kg/m   Wt Readings from Last 3 Encounters:  01/09/21 207 lb (93.9 kg)  01/02/21 214 lb (97.1 kg)  12/01/20 213 lb (96.6 kg)     EXAM: General: Pt appears well and is in NAD Bilateral cerumen impaction noted  Heart: RRR , + systolic murmur  Lungs: CTA  Extremities:  BL LE: 1+  pretibial edema   Mental Status: Judgment, insight: intact Orientation: oriented to time, place, and person Mood and affect: no depression, anxiety, or agitation    DM Foot Exam 05/04/2020 The skin of the feet is without sores or ulcerations. The pedal pulses unable to palpate due to edema  The sensation is decreased to a screening 5.07, 10 gram monofilament bilaterally at the toes    DATA REVIEWED: Results for LINCOLN, KLEINER (MRN [id number]) as of 05/04/2020 08:28  Ref. Range 04/25/2020 12:28  Sodium Latest Ref Range: 135 - 145 mmol/L 141  Potassium Latest Ref Range: 3.5 - 5.1 mmol/L 4.2  Chloride Latest Ref Range: 98 - 111 mmol/L 105  CO2 Latest Ref Range: 22 - 32 mmol/L 28  Glucose Latest Ref Range: 70 - 99 mg/dL 118 (H)  Mean Plasma Glucose Latest Units: mg/dL 188.64  BUN Latest Ref Range: 8 - 23 mg/dL 29 (H)  Creatinine Latest Ref Range: 0.44 - 1.00 mg/dL 1.45 (H)  Calcium Latest Ref Range: 8.9 - 10.3 mg/dL 9.5  Anion gap Latest Ref Range: 5 - 15  8  GFR, Estimated Latest Ref Range: >60 mL/min 38 (L)      Lab Results  Component Value Date    HGBA1C 7.1 (A) 07/27/2020   HGBA1C 8.2 (H) 04/25/2020   HGBA1C 8.8 (A) 10/19/2019   Lab Results  Component Value Date   LDLCALC 65 10/20/2019   CREATININE 1.45 (H) 04/25/2020     Lab Results  Component Value Date   CHOL 150 10/20/2019   HDL 44 10/20/2019   LDLCALC 65 10/20/2019   TRIG 206 (H) 10/20/2019   CHOLHDL 3.4 10/20/2019         ASSESSMENT / PLAN / RECOMMENDATIONS:   1) Type 2 Diabetes Mellitus, poorly with Neuropathic, CKD III and Macrovascular  complications - Most recent A1c of 8.8%. Goal A1c < 7.5 %.    -Unfortunately her glycemic control has worsened, this is due to multiple factors to include stopping exercise due to physical limitations, also her prandial insulin cost has increased and she is currently on patient assistance program, but she was without prandial insulin for almost a month  -Her in office BG was 77 mg/DL this is after taking 28 units of Humalog with breakfast -I am going to reduce her insulin as below -I have attempted to prescribe Dexcom in the past, I explained to the patient that we use third-party pharmacy for this and she was provided with contact information for [facility] and in case it is needed -She will continue to use correction scale as below   MEDICATIONS: Decrease Tresiba to 75 units ONCE daily  She is on Jardiance 10 mg daily per cardiology Decrease Humalog to 25 units with each meal CF: Humalog ( BG - 150/20)   EDUCATION / INSTRUCTIONS: BG monitoring instructions: Patient is instructed to check her blood sugars 4 times a day, before each meal and bedtime. Call [facility] if: BG persistently < 70 I reviewed the Rule of 15 for the treatment of hypoglycemia in detail with the patient. Literature supplied.       F/U in 4 months   Signed electronically by: Mack Guise, MD  [facility]  [facility] [address] [place], [place] [address] Phone: [phone] FAX:  [phone]   CC: Seward Carol, [place] [employer] [address] [place] [address] Phone: [phone]  Fax: [phone]  Return to Endocrinology clinic as below: Future Appointments  Date Time Provider [place]  01/19/2021 11:30 AM Antionio Negron, Melanie Crazier, MD [facility] None  02/09/2021 10:15 AM Rankin, Clent Demark, MD [facility] None  03/03/2021  7:05 AM [facility] DEVICE REMOTES [facility]  06/02/2021  7:05 AM [facility] DEVICE REMOTES [facility]  09/01/2021  7:05 AM [facility] DEVICE REMOTES [facility]

## 2021-01-19 NOTE — Patient Instructions (Addendum)
-   Decrease Tresiba 75 units ONCE daily  - Continue Jardiance 10 mg daily  -Decrease  HUmalog 25 units with each meal  -Humalog correctional insulin:Use the scale below to help guide you before meals   Blood sugar before meal Number of units to inject  Less than 170 0 unit  171 -  190 1 units  191 -  210 2 units  211 -  230 3 units  231 -  250 4 units  251 -  270 5 units  271 -  290 6 units  291 -  310 7 units     HOW TO TREAT LOW BLOOD SUGARS (Blood sugar LESS THAN 70 MG/DL) Please follow the RULE OF 15 for the treatment of hypoglycemia treatment (when your (blood sugars are less than 70 mg/dL)   STEP 1: Take 15 grams of carbohydrates when your blood sugar is low, which includes:  3-4 GLUCOSE TABS  OR 3-4 OZ OF JUICE OR REGULAR SODA OR ONE TUBE OF GLUCOSE GEL    STEP 2: RECHECK blood sugar in 15 MINUTES STEP 3: If your blood sugar is still low at the 15 minute recheck --> then, go back to STEP 1 and treat AGAIN with another 15 grams of carbohydrates.

## 2021-01-23 ENCOUNTER — Other Ambulatory Visit: Payer: Self-pay | Admitting: Internal Medicine

## 2021-01-23 ENCOUNTER — Other Ambulatory Visit (HOSPITAL_COMMUNITY): Payer: Self-pay

## 2021-01-23 DIAGNOSIS — Z1231 Encounter for screening mammogram for malignant neoplasm of breast: Secondary | ICD-10-CM

## 2021-01-23 NOTE — Progress Notes (Signed)
Paramedicine Encounter    Patient ID: Danielle [name], female    DOB: Mar 11, 1946, 75 y.o.   MRN: [id number]   Patient Care Team: Seward Carol, MD as PCP - General (Internal Medicine) Constance Haw, MD as PCP - Electrophysiology (Cardiology) Sueanne Margarita, MD as PCP - Sleep Medicine (Cardiology) Jorge Ny, LCSW as Social Worker (Licensed Clinical Social Worker) Shamleffer, Melanie Crazier, MD as Consulting Physician (Endocrinology)  Patient Active Problem List   Diagnosis Date Noted   Polyneuropathy associated with underlying disease ([place]) 05/04/2020   Severe nonproliferative diabetic retinopathy of left eye, with macular edema, associated with type 2 diabetes mellitus ([place]) 11/05/2019   Severe nonproliferative diabetic retinopathy of right eye, with macular edema, associated with type 2 diabetes mellitus ([place]) 11/05/2019   Retinal hemorrhage of right eye 11/05/2019   Retinal hemorrhage of left eye 11/05/2019   Type 2 diabetes mellitus with hyperglycemia, with long-term current use of insulin ([place]) 05/14/2019   Diabetes mellitus ([place]) 05/14/2019   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, with long-term current use of insulin ([place]) 05/14/2019   Type 2 diabetes mellitus with diabetic polyneuropathy, with long-term current use of insulin ([place]) 05/14/2019   Encephalopathy acute 05/20/2018   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 05/20/2018   Acute kidney failure ([place]) 05/15/2018   Primary localized osteoarthritis of right knee 05/13/2018   (HFpEF) heart failure with preserved ejection fraction ([place]) 12/20/2017   Obesity (BMI 30-39.9) 11/11/2017   OSA (obstructive sleep apnea) 02/18/2017   Essential hypertension    Diabetes mellitus without complication (HCC)    Cancer (HCC)    Thyroid disease     Current Outpatient Medications:    acetaminophen (TYLENOL) 500 MG tablet, Take 500-1,000 mg by mouth every 6 (six) hours as needed (for pain or  headaches)., Disp: , Rfl:    Alcohol Swabs PADS, See admin instructions., Disp: , Rfl:    allopurinol (ZYLOPRIM) 100 MG tablet, 1 tablet, Disp: , Rfl:    apixaban (ELIQUIS) 5 MG TABS tablet, Take 1 tablet (5 mg total) by mouth 2 (two) times daily., Disp: 60 tablet, Rfl: 11   atorvastatin (LIPITOR) 80 MG tablet, Take 1 tablet (80 mg total) by mouth at bedtime., Disp: 100 tablet, Rfl: 3   Biotin 10 MG TABS, 1 tablet, Disp: , Rfl:    Blood Glucose Monitoring Suppl (ONETOUCH VERIO) w/Device KIT, Use to check blood sugar, Disp: 1 kit, Rfl: 0   carvedilol (COREG) 25 MG tablet, 1 tablet by mouth 2 times daily with food, Disp: 60 tablet, Rfl: 11   clotrimazole-betamethasone (LOTRISONE) lotion, Apply a small amount twice daily to affected area(s) for 14 days, then use as needed for itch., Disp: , Rfl:    Continuous Blood Gluc Sensor (DEXCOM G6 SENSOR) MISC, 1 Device by Does not apply route as directed., Disp: 9 each, Rfl: 3   Continuous Blood Gluc Transmit (DEXCOM G6 TRANSMITTER) MISC, 1 Device by Does not apply route as directed., Disp: 1 each, Rfl: 3   empagliflozin (JARDIANCE) 10 MG TABS tablet, 1 tablet, Disp: , Rfl:    empagliflozin (JARDIANCE) 10 MG TABS tablet, Take 10 mg by mouth daily., Disp: , Rfl:    Ferrous Sulfate (IRON) 325 (65 Fe) MG TABS, Take 1 tablet by mouth daily., Disp: , Rfl:    gabapentin (NEURONTIN) 300 MG capsule, Take 300 mg by mouth 2 (two) times daily., Disp: , Rfl:    glucose blood (FREESTYLE LITE) test strip, 1 each  by Other route in the morning, at noon, in the evening, and at bedtime. Use as instructed, Disp: 400 each, Rfl: 3   glucose blood (ONETOUCH VERIO) test strip, Use to test blood  sugar 4 times a day, Disp: 100 each, Rfl: 12   insulin aspart (NOVOLOG FLEXPEN) 100 UNIT/ML FlexPen, Max daily 30 units per correction scale, Disp: 15 mL, Rfl: 11   insulin degludec (TRESIBA FLEXTOUCH) 100 UNIT/ML FlexTouch Pen, Inject 50 Units into the skin daily., Disp: 30 mL, Rfl: 6    isosorbide mononitrate (IMDUR) 30 MG 24 hr tablet, TAKE 1 TABLET BY MOUTH ONCE DAILY, Disp: 30 tablet, Rfl: 11   Lancets (ONETOUCH ULTRASOFT) lancets, Use as  to test blood sugar  4 times a day, Disp: 100 each, Rfl: 12   latanoprost (XALATAN) 0.005 % ophthalmic solution, 1 drop into affected eye in the evening, Disp: , Rfl:    levothyroxine (SYNTHROID, LEVOTHROID) 75 MCG tablet, Take 75 mcg by mouth daily before breakfast., Disp: , Rfl:    losartan (COZAAR) 50 MG tablet, TAKE 1 TABLET BY MOUTH AT BEDTIME, Disp: 30 tablet, Rfl: 11   LUMIGAN 0.01 % SOLN, , Disp: , Rfl:    Multiple Vitamins-Minerals (ALIVE ONCE DAILY WOMENS 50+ PO), Take 1 tablet by mouth daily., Disp: , Rfl:    mupirocin ointment (BACTROBAN) 2 %, Apply to left great toe once daily for 14 days., Disp: 30 g, Rfl: 1   nitroGLYCERIN (NITROSTAT) 0.4 MG SL tablet, DISSOLVE ONE TABLET UNDER THE TONGUE EVERY 5 MINUTES AS NEEDED FOR CHEST PAIN., Disp: 25 tablet, Rfl: 3   Omega-3 Fatty Acids (FISH OIL PO), Take 1 capsule by mouth daily., Disp: , Rfl:    potassium chloride (KLOR-CON) 10 MEQ tablet, Take 2 tablets by mouth once daily, Disp: 60 tablet, Rfl: 11   RELION PEN NEEDLE 31G/8MM 31G X 8 MM MISC, USE 1 THREE TIMES DAILY AS DIRECTED, Disp: , Rfl:    spironolactone (ALDACTONE) 25 MG tablet, Take 1 tablet (25 mg total) by mouth daily., Disp: 30 tablet, Rfl: 11   torsemide (DEMADEX) 20 MG tablet, Take 2 tablets (40 mg total) by mouth daily., Disp: 60 tablet, Rfl: 11   Travoprost, BAK Free, (TRAVATAN) 0.004 % SOLN ophthalmic solution, Place 1 drop into both eyes at bedtime. , Disp: , Rfl:  Allergies  Allergen Reactions   Other Other (See Comments)    Blood products- Patient won't accept any Other reaction(s): rash   Sulfa Antibiotics Rash   Uloric [Febuxostat] Rash     Social History   Socioeconomic History   Marital status: Widowed    Spouse name: Not on file   Number of children: 3   Years of education: Not on file   Highest  education level: Some college, no degree  Occupational History   Occupation: retired  Tobacco Use   Smoking status: Former    Packs/day: 1.00    Years: 22.00    Pack years: 22.00    Types: Cigarettes    Quit date: 1983    Years since quitting: 39.6   Smokeless tobacco: Never  Vaping Use   Vaping Use: Never used  Substance and Sexual Activity   Alcohol use: Yes    Comment: 05/22/2018 "glass of wine couple times/wk"   Drug use: Not Currently   Sexual activity: Not Currently    Birth control/protection: None, Post-menopausal  Other Topics Concern   Not on file  Social History Narrative   Not on file     Social Determinants of Health   Financial Resource Strain: Not on file  Food Insecurity: Not on file  Transportation Needs: Not on file  Physical Activity: Not on file  Stress: Not on file  Social Connections: Not on file  Intimate Partner Violence: Not on file    Physical Exam Vitals reviewed.  Constitutional:      Appearance: Normal appearance. She is normal weight.  HENT:     Head: Normocephalic.     Nose: Nose normal.     Mouth/Throat:     Mouth: Mucous membranes are moist.     Pharynx: Oropharynx is clear.  Eyes:     Conjunctiva/sclera: Conjunctivae normal.     Pupils: Pupils are equal, round, and reactive to light.  Cardiovascular:     Rate and Rhythm: Normal rate and regular rhythm.     Pulses: Normal pulses.     Heart sounds: Normal heart sounds.  Pulmonary:     Effort: Pulmonary effort is normal.     Breath sounds: Normal breath sounds.  Abdominal:     General: Abdomen is flat.     Palpations: Abdomen is soft.  Musculoskeletal:        General: Swelling present. Normal range of motion.     Cervical back: Normal range of motion.     Right lower leg: Edema present.     Left lower leg: Edema present.  Skin:    General: Skin is warm and dry.     Capillary Refill: Capillary refill takes less than 2 seconds.  Neurological:     General: No focal deficit  present.     Mental Status: She is alert. Mental status is at baseline.  Psychiatric:        Mood and Affect: Mood normal.    Arrived for home visit for [facility] who was alert and oriented reporting to be feeling good with no complaints of shortness of breath, dizziness or chest pain. Danielle [name] was complaint with her medications minus two evening doses last week. I explained the importance of taking all of her medications and she agreed and understood.   I obtained assessment and vitals as noted.  Some lower leg swelling noted, Akyla admitted to eating some take out last week. We discussed low salt options. She agreed and understood.   I filled one week of pill box for her.   Refills:  Spironolactone Carvedilol Potassium   Eliquis packet filled out and will be turned in this week.   CBG- 171  Danielle [name] has been checking her sugar with her new glucometer and taking her insulin as prescribed.   Home visit complete, I will see her in one week.    Future Appointments  Date Time Provider [place]  02/09/2021 10:15 AM Rankin, Clent Demark, MD [facility] None  03/03/2021  7:05 AM [facility] DEVICE REMOTES [facility]  03/03/2021 12:00 PM [facility]G MM 2 [facility]  05/31/2021  9:50 AM Shamleffer, Melanie Crazier, MD [facility] None  06/02/2021  7:05 AM [facility] DEVICE REMOTES [facility]  09/01/2021  7:05 AM [facility] DEVICE REMOTES [facility]     ACTION: Home visit completed

## 2021-01-26 ENCOUNTER — Ambulatory Visit (INDEPENDENT_AMBULATORY_CARE_PROVIDER_SITE_OTHER): Payer: Medicare HMO

## 2021-01-26 DIAGNOSIS — I495 Sick sinus syndrome: Secondary | ICD-10-CM | POA: Diagnosis not present

## 2021-01-27 ENCOUNTER — Telehealth (HOSPITAL_COMMUNITY): Payer: Self-pay | Admitting: Pharmacy Technician

## 2021-01-27 NOTE — Telephone Encounter (Signed)
Advanced Heart Failure Patient Advocate Encounter  Sent in BMS application via fax.  Will follow up.

## 2021-01-30 ENCOUNTER — Other Ambulatory Visit (HOSPITAL_COMMUNITY): Payer: Self-pay

## 2021-01-30 NOTE — Progress Notes (Signed)
Paramedicine Encounter    Patient ID: Danielle [name], female    DOB: Mar 11, 1946, 75 y.o.   MRN: [id number]   Patient Care Team: Seward Carol, MD as PCP - General (Internal Medicine) Constance Haw, MD as PCP - Electrophysiology (Cardiology) Sueanne Margarita, MD as PCP - Sleep Medicine (Cardiology) Jorge Ny, LCSW as Social Worker (Licensed Clinical Social Worker) Shamleffer, Melanie Crazier, MD as Consulting Physician (Endocrinology)  Patient Active Problem List   Diagnosis Date Noted   Polyneuropathy associated with underlying disease ([place]) 05/04/2020   Severe nonproliferative diabetic retinopathy of left eye, with macular edema, associated with type 2 diabetes mellitus ([place]) 11/05/2019   Severe nonproliferative diabetic retinopathy of right eye, with macular edema, associated with type 2 diabetes mellitus ([place]) 11/05/2019   Retinal hemorrhage of right eye 11/05/2019   Retinal hemorrhage of left eye 11/05/2019   Type 2 diabetes mellitus with hyperglycemia, with long-term current use of insulin ([place]) 05/14/2019   Diabetes mellitus ([place]) 05/14/2019   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, with long-term current use of insulin ([place]) 05/14/2019   Type 2 diabetes mellitus with diabetic polyneuropathy, with long-term current use of insulin ([place]) 05/14/2019   Encephalopathy acute 05/20/2018   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 05/20/2018   Acute kidney failure ([place]) 05/15/2018   Primary localized osteoarthritis of right knee 05/13/2018   (HFpEF) heart failure with preserved ejection fraction ([place]) 12/20/2017   Obesity (BMI 30-39.9) 11/11/2017   OSA (obstructive sleep apnea) 02/18/2017   Essential hypertension    Diabetes mellitus without complication (HCC)    Cancer (HCC)    Thyroid disease     Current Outpatient Medications:    acetaminophen (TYLENOL) 500 MG tablet, Take 500-1,000 mg by mouth every 6 (six) hours as needed (for pain or  headaches)., Disp: , Rfl:    Alcohol Swabs PADS, See admin instructions., Disp: , Rfl:    allopurinol (ZYLOPRIM) 100 MG tablet, 1 tablet, Disp: , Rfl:    apixaban (ELIQUIS) 5 MG TABS tablet, Take 1 tablet (5 mg total) by mouth 2 (two) times daily., Disp: 60 tablet, Rfl: 11   atorvastatin (LIPITOR) 80 MG tablet, Take 1 tablet (80 mg total) by mouth at bedtime., Disp: 100 tablet, Rfl: 3   Biotin 10 MG TABS, 1 tablet, Disp: , Rfl:    Blood Glucose Monitoring Suppl (ONETOUCH VERIO) w/Device KIT, Use to check blood sugar, Disp: 1 kit, Rfl: 0   carvedilol (COREG) 25 MG tablet, 1 tablet by mouth 2 times daily with food, Disp: 60 tablet, Rfl: 11   clotrimazole-betamethasone (LOTRISONE) lotion, Apply a small amount twice daily to affected area(s) for 14 days, then use as needed for itch., Disp: , Rfl:    Continuous Blood Gluc Sensor (DEXCOM G6 SENSOR) MISC, 1 Device by Does not apply route as directed., Disp: 9 each, Rfl: 3   Continuous Blood Gluc Transmit (DEXCOM G6 TRANSMITTER) MISC, 1 Device by Does not apply route as directed., Disp: 1 each, Rfl: 3   empagliflozin (JARDIANCE) 10 MG TABS tablet, 1 tablet, Disp: , Rfl:    empagliflozin (JARDIANCE) 10 MG TABS tablet, Take 10 mg by mouth daily., Disp: , Rfl:    Ferrous Sulfate (IRON) 325 (65 Fe) MG TABS, Take 1 tablet by mouth daily., Disp: , Rfl:    gabapentin (NEURONTIN) 300 MG capsule, Take 300 mg by mouth 2 (two) times daily., Disp: , Rfl:    glucose blood (FREESTYLE LITE) test strip, 1 each  by Other route in the morning, at noon, in the evening, and at bedtime. Use as instructed, Disp: 400 each, Rfl: 3   glucose blood (ONETOUCH VERIO) test strip, Use to test blood  sugar 4 times a day, Disp: 100 each, Rfl: 12   insulin aspart (NOVOLOG FLEXPEN) 100 UNIT/ML FlexPen, Max daily 30 units per correction scale, Disp: 15 mL, Rfl: 11   insulin degludec (TRESIBA FLEXTOUCH) 100 UNIT/ML FlexTouch Pen, Inject 50 Units into the skin daily., Disp: 30 mL, Rfl: 6    isosorbide mononitrate (IMDUR) 30 MG 24 hr tablet, TAKE 1 TABLET BY MOUTH ONCE DAILY, Disp: 30 tablet, Rfl: 11   Lancets (ONETOUCH ULTRASOFT) lancets, Use as  to test blood sugar  4 times a day, Disp: 100 each, Rfl: 12   latanoprost (XALATAN) 0.005 % ophthalmic solution, 1 drop into affected eye in the evening, Disp: , Rfl:    levothyroxine (SYNTHROID, LEVOTHROID) 75 MCG tablet, Take 75 mcg by mouth daily before breakfast., Disp: , Rfl:    losartan (COZAAR) 50 MG tablet, TAKE 1 TABLET BY MOUTH AT BEDTIME, Disp: 30 tablet, Rfl: 11   LUMIGAN 0.01 % SOLN, , Disp: , Rfl:    Multiple Vitamins-Minerals (ALIVE ONCE DAILY WOMENS 50+ PO), Take 1 tablet by mouth daily., Disp: , Rfl:    mupirocin ointment (BACTROBAN) 2 %, Apply to left great toe once daily for 14 days., Disp: 30 g, Rfl: 1   nitroGLYCERIN (NITROSTAT) 0.4 MG SL tablet, DISSOLVE ONE TABLET UNDER THE TONGUE EVERY 5 MINUTES AS NEEDED FOR CHEST PAIN., Disp: 25 tablet, Rfl: 3   Omega-3 Fatty Acids (FISH OIL PO), Take 1 capsule by mouth daily., Disp: , Rfl:    potassium chloride (KLOR-CON) 10 MEQ tablet, Take 2 tablets by mouth once daily, Disp: 60 tablet, Rfl: 11   RELION PEN NEEDLE 31G/8MM 31G X 8 MM MISC, USE 1 THREE TIMES DAILY AS DIRECTED, Disp: , Rfl:    spironolactone (ALDACTONE) 25 MG tablet, Take 1 tablet (25 mg total) by mouth daily., Disp: 30 tablet, Rfl: 11   torsemide (DEMADEX) 20 MG tablet, Take 2 tablets (40 mg total) by mouth daily., Disp: 60 tablet, Rfl: 11   Travoprost, BAK Free, (TRAVATAN) 0.004 % SOLN ophthalmic solution, Place 1 drop into both eyes at bedtime. , Disp: , Rfl:  Allergies  Allergen Reactions   Other Other (See Comments)    Blood products- Patient won't accept any Other reaction(s): rash   Sulfa Antibiotics Rash   Uloric [Febuxostat] Rash     Social History   Socioeconomic History   Marital status: Widowed    Spouse name: Not on file   Number of children: 3   Years of education: Not on file   Highest  education level: Some college, no degree  Occupational History   Occupation: retired  Tobacco Use   Smoking status: Former    Packs/day: 1.00    Years: 22.00    Pack years: 22.00    Types: Cigarettes    Quit date: 1983    Years since quitting: 39.7   Smokeless tobacco: Never  Vaping Use   Vaping Use: Never used  Substance and Sexual Activity   Alcohol use: Yes    Comment: 05/22/2018 "glass of wine couple times/wk"   Drug use: Not Currently   Sexual activity: Not Currently    Birth control/protection: None, Post-menopausal  Other Topics Concern   Not on file  Social History Narrative   Not on file  Social Determinants of Health   Financial Resource Strain: Not on file  Food Insecurity: Not on file  Transportation Needs: Not on file  Physical Activity: Not on file  Stress: Not on file  Social Connections: Not on file  Intimate Partner Violence: Not on file    Physical Exam Vitals reviewed.  Constitutional:      Appearance: Normal appearance. She is normal weight.  HENT:     Head: Normocephalic.     Nose: Nose normal.     Mouth/Throat:     Mouth: Mucous membranes are moist.     Pharynx: Oropharynx is clear.  Eyes:     Conjunctiva/sclera: Conjunctivae normal.     Pupils: Pupils are equal, round, and reactive to light.  Cardiovascular:     Rate and Rhythm: Normal rate and regular rhythm.     Pulses: Normal pulses.     Heart sounds: Normal heart sounds.  Abdominal:     General: Abdomen is flat.     Palpations: Abdomen is soft.  Musculoskeletal:        General: Swelling present.     Cervical back: Normal range of motion.     Right lower leg: Edema present.     Left lower leg: Edema present.     Comments: Bilateral shoulder and cervical pain, worsens on ROM.  Skin:    General: Skin is warm and dry.     Capillary Refill: Capillary refill takes less than 2 seconds.  Neurological:     General: No focal deficit present.     Mental Status: She is alert. Mental  status is at baseline.  Psychiatric:        Mood and Affect: Mood normal.    Arrived for home visit for [place] who was alert and oriented reporting to be feeling okay but complaining of shoulder and neck pain which has worsened over the last few weeks. She reports she has not done any heavy lifting but the pain worsens on ROM. She has been using Coleta and Tylenol for pain. She has reached out to her PCP for an appointment but they are closed today. I advised her to continue to take Tylenol $RemoveBefo'1000mg'[id number]$  every 6-8 hours as needed and to use the ICY HOT and try ICE/HEAT alternating for pain. She agreed with plan.   Danielle [name] has been compliant with her medications over the last week. BMS application was sent in for Eliquis assistance.   Assessment and vitals as noted. [place] denied chest pain, dizziness, shortness of breath or trouble with daily activities. Lower leg swelling but no more than her normal.   I filled two weeks worth of pill boxes for Danielle [name]. She will need eliquis by Sunday the 18th. I will obtain samples and deliver within the next two weeks.   Danielle [name] and I reviewed upcoming appointments.   Home visit complete. I will see Danielle [name] in two weeks.   Refills- Spironolactone Isosorbide     Future Appointments  Date Time Provider [place]  02/09/2021 10:15 AM Rankin, Clent Demark, MD RDE-RDE None  03/03/2021 12:00 PM [facility] MM 2 [facility]  04/27/2021  7:55 AM [facility] DEVICE REMOTES [facility]  05/31/2021  9:50 AM Shamleffer, Melanie Crazier, MD [facility] None  07/27/2021  7:55 AM [facility] DEVICE REMOTES [facility]  10/26/2021  7:55 AM [facility] DEVICE REMOTES [facility]  01/25/2022  7:55 AM [facility] DEVICE REMOTES [facility]  04/26/2022  7:55 AM [facility] DEVICE REMOTES [facility]  ACTION: Home visit completed

## 2021-01-31 LAB — CUP PACEART REMOTE DEVICE CHECK
Battery Remaining Longevity: 59 mo
Battery Voltage: 2.99 V
Brady Statistic AP VP Percent: 0.01 %
Brady Statistic AP VS Percent: 14.09 %
Brady Statistic AS VP Percent: 0.04 %
Brady Statistic AS VS Percent: 85.85 %
Brady Statistic RA Percent Paced: 14.09 %
Brady Statistic RV Percent Paced: 0.06 %
Date Time Interrogation Session: 20220831175320
Implantable Lead Implant Date: 20160222
Implantable Lead Implant Date: 20160222
Implantable Lead Location: 753859
Implantable Lead Location: 753860
Implantable Lead Model: 5076
Implantable Lead Model: 5076
Implantable Pulse Generator Implant Date: 20160222
Lead Channel Impedance Value: 399 Ohm
Lead Channel Impedance Value: 418 Ohm
Lead Channel Impedance Value: 437 Ohm
Lead Channel Impedance Value: 513 Ohm
Lead Channel Pacing Threshold Amplitude: 0.625 V
Lead Channel Pacing Threshold Amplitude: 0.75 V
Lead Channel Pacing Threshold Pulse Width: 0.4 ms
Lead Channel Pacing Threshold Pulse Width: 0.4 ms
Lead Channel Sensing Intrinsic Amplitude: 17.375 mV
Lead Channel Sensing Intrinsic Amplitude: 17.375 mV
Lead Channel Sensing Intrinsic Amplitude: 3.875 mV
Lead Channel Sensing Intrinsic Amplitude: 3.875 mV
Lead Channel Setting Pacing Amplitude: 2 V
Lead Channel Setting Pacing Amplitude: 2 V
Lead Channel Setting Pacing Pulse Width: 0.4 ms
Lead Channel Setting Sensing Sensitivity: 2 mV

## 2021-02-01 ENCOUNTER — Other Ambulatory Visit: Payer: Self-pay | Admitting: Orthopedic Surgery

## 2021-02-01 DIAGNOSIS — E1165 Type 2 diabetes mellitus with hyperglycemia: Secondary | ICD-10-CM | POA: Diagnosis not present

## 2021-02-01 DIAGNOSIS — M25511 Pain in right shoulder: Secondary | ICD-10-CM | POA: Diagnosis not present

## 2021-02-01 DIAGNOSIS — M542 Cervicalgia: Secondary | ICD-10-CM

## 2021-02-01 DIAGNOSIS — M75120 Complete rotator cuff tear or rupture of unspecified shoulder, not specified as traumatic: Secondary | ICD-10-CM

## 2021-02-07 NOTE — Telephone Encounter (Signed)
Advanced Heart Failure Patient Advocate Encounter   Patient was approved to receive Eliquis from BMS  Patient ID: [id number] Effective dates: 02/01/21 through 05/27/21  Called and spoke with the patient.   [name], CPhT

## 2021-02-07 NOTE — Progress Notes (Signed)
Remote pacemaker transmission.   

## 2021-02-09 ENCOUNTER — Encounter (INDEPENDENT_AMBULATORY_CARE_PROVIDER_SITE_OTHER): Payer: Medicare HMO | Admitting: Ophthalmology

## 2021-02-13 ENCOUNTER — Other Ambulatory Visit (HOSPITAL_COMMUNITY): Payer: Self-pay

## 2021-02-13 NOTE — Progress Notes (Signed)
Paramedicine Encounter    Patient ID: Danielle [name], female    DOB: 09/11/1945, 75 y.o.   MRN: [id number]   Patient Care Team: Seward Carol, MD as PCP - General (Internal Medicine) Constance Haw, MD as PCP - Electrophysiology (Cardiology) Sueanne Margarita, MD as PCP - Sleep Medicine (Cardiology) Jorge Ny, LCSW as Social Worker (Licensed Clinical Social Worker) Shamleffer, Melanie Crazier, MD as Consulting Physician (Endocrinology)  Patient Active Problem List   Diagnosis Date Noted   Polyneuropathy associated with underlying disease ([place]) 05/04/2020   Severe nonproliferative diabetic retinopathy of left eye, with macular edema, associated with type 2 diabetes mellitus ([place]) 11/05/2019   Severe nonproliferative diabetic retinopathy of right eye, with macular edema, associated with type 2 diabetes mellitus ([place]) 11/05/2019   Retinal hemorrhage of right eye 11/05/2019   Retinal hemorrhage of left eye 11/05/2019   Type 2 diabetes mellitus with hyperglycemia, with long-term current use of insulin ([place]) 05/14/2019   Diabetes mellitus ([place]) 05/14/2019   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, with long-term current use of insulin ([place]) 05/14/2019   Type 2 diabetes mellitus with diabetic polyneuropathy, with long-term current use of insulin ([place]) 05/14/2019   Encephalopathy acute 05/20/2018   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 05/20/2018   Acute kidney failure ([place]) 05/15/2018   Primary localized osteoarthritis of right knee 05/13/2018   (HFpEF) heart failure with preserved ejection fraction ([place]) 12/20/2017   Obesity (BMI 30-39.9) 11/11/2017   OSA (obstructive sleep apnea) 02/18/2017   Essential hypertension    Diabetes mellitus without complication (HCC)    Cancer (HCC)    Thyroid disease     Current Outpatient Medications:    acetaminophen (TYLENOL) 500 MG tablet, Take 500-1,000 mg by mouth every 6 (six) hours as needed (for pain or  headaches)., Disp: , Rfl:    Alcohol Swabs PADS, See admin instructions., Disp: , Rfl:    allopurinol (ZYLOPRIM) 100 MG tablet, 1 tablet, Disp: , Rfl:    apixaban (ELIQUIS) 5 MG TABS tablet, Take 1 tablet (5 mg total) by mouth 2 (two) times daily., Disp: 60 tablet, Rfl: 11   atorvastatin (LIPITOR) 80 MG tablet, Take 1 tablet (80 mg total) by mouth at bedtime., Disp: 100 tablet, Rfl: 3   Biotin 10 MG TABS, 1 tablet, Disp: , Rfl:    Blood Glucose Monitoring Suppl (ONETOUCH VERIO) w/Device KIT, Use to check blood sugar, Disp: 1 kit, Rfl: 0   carvedilol (COREG) 25 MG tablet, 1 tablet by mouth 2 times daily with food, Disp: 60 tablet, Rfl: 11   clotrimazole-betamethasone (LOTRISONE) lotion, Apply a small amount twice daily to affected area(s) for 14 days, then use as needed for itch., Disp: , Rfl:    Continuous Blood Gluc Sensor (DEXCOM G6 SENSOR) MISC, 1 Device by Does not apply route as directed. (Patient not taking: Reported on 01/30/2021), Disp: 9 each, Rfl: 3   Continuous Blood Gluc Transmit (DEXCOM G6 TRANSMITTER) MISC, 1 Device by Does not apply route as directed. (Patient not taking: Reported on 01/30/2021), Disp: 1 each, Rfl: 3   empagliflozin (JARDIANCE) 10 MG TABS tablet, 1 tablet, Disp: , Rfl:    empagliflozin (JARDIANCE) 10 MG TABS tablet, Take 10 mg by mouth daily., Disp: , Rfl:    Ferrous Sulfate (IRON) 325 (65 Fe) MG TABS, Take 1 tablet by mouth daily., Disp: , Rfl:    gabapentin (NEURONTIN) 300 MG capsule, Take 300 mg by mouth 2 (two) times daily., Disp: ,  Rfl:    glucose blood (FREESTYLE LITE) test strip, 1 each by Other route in the morning, at noon, in the evening, and at bedtime. Use as instructed (Patient not taking: Reported on 01/30/2021), Disp: 400 each, Rfl: 3   glucose blood (ONETOUCH VERIO) test strip, Use to test blood  sugar 4 times a day, Disp: 100 each, Rfl: 12   insulin aspart (NOVOLOG FLEXPEN) 100 UNIT/ML FlexPen, Max daily 30 units per correction scale, Disp: 15 mL, Rfl: 11    insulin degludec (TRESIBA FLEXTOUCH) 100 UNIT/ML FlexTouch Pen, Inject 50 Units into the skin daily., Disp: 30 mL, Rfl: 6   isosorbide mononitrate (IMDUR) 30 MG 24 hr tablet, TAKE 1 TABLET BY MOUTH ONCE DAILY, Disp: 30 tablet, Rfl: 11   Lancets (ONETOUCH ULTRASOFT) lancets, Use as  to test blood sugar  4 times a day, Disp: 100 each, Rfl: 12   latanoprost (XALATAN) 0.005 % ophthalmic solution, 1 drop into affected eye in the evening, Disp: , Rfl:    levothyroxine (SYNTHROID, LEVOTHROID) 75 MCG tablet, Take 75 mcg by mouth daily before breakfast., Disp: , Rfl:    losartan (COZAAR) 50 MG tablet, TAKE 1 TABLET BY MOUTH AT BEDTIME, Disp: 30 tablet, Rfl: 11   LUMIGAN 0.01 % SOLN, , Disp: , Rfl:    Multiple Vitamins-Minerals (ALIVE ONCE DAILY WOMENS 50+ PO), Take 1 tablet by mouth daily., Disp: , Rfl:    mupirocin ointment (BACTROBAN) 2 %, Apply to left great toe once daily for 14 days., Disp: 30 g, Rfl: 1   nitroGLYCERIN (NITROSTAT) 0.4 MG SL tablet, DISSOLVE ONE TABLET UNDER THE TONGUE EVERY 5 MINUTES AS NEEDED FOR CHEST PAIN., Disp: 25 tablet, Rfl: 3   Omega-3 Fatty Acids (FISH OIL PO), Take 1 capsule by mouth daily., Disp: , Rfl:    potassium chloride (KLOR-CON) 10 MEQ tablet, Take 2 tablets by mouth once daily, Disp: 60 tablet, Rfl: 11   RELION PEN NEEDLE 31G/8MM 31G X 8 MM MISC, USE 1 THREE TIMES DAILY AS DIRECTED, Disp: , Rfl:    spironolactone (ALDACTONE) 25 MG tablet, Take 1 tablet (25 mg total) by mouth daily., Disp: 30 tablet, Rfl: 11   torsemide (DEMADEX) 20 MG tablet, Take 2 tablets (40 mg total) by mouth daily., Disp: 60 tablet, Rfl: 11   Travoprost, BAK Free, (TRAVATAN) 0.004 % SOLN ophthalmic solution, Place 1 drop into both eyes at bedtime. , Disp: , Rfl:  Allergies  Allergen Reactions   Other Other (See Comments)    Blood products- Patient won't accept any Other reaction(s): rash   Sulfa Antibiotics Rash   Uloric [Febuxostat] Rash     Social History   Socioeconomic History    Marital status: Widowed    Spouse name: Not on file   Number of children: 3   Years of education: Not on file   Highest education level: Some college, no degree  Occupational History   Occupation: retired  Tobacco Use   Smoking status: Former    Packs/day: 1.00    Years: 22.00    Pack years: 22.00    Types: Cigarettes    Quit date: 1983    Years since quitting: 39.7   Smokeless tobacco: Never  Vaping Use   Vaping Use: Never used  Substance and Sexual Activity   Alcohol use: Yes    Comment: 05/22/2018 "glass of wine couple times/wk"   Drug use: Not Currently   Sexual activity: Not Currently    Birth control/protection: None, Post-menopausal  Other  Topics Concern   Not on file  Social History Narrative   Not on file   Social Determinants of Health   Financial Resource Strain: Not on file  Food Insecurity: Not on file  Transportation Needs: Not on file  Physical Activity: Not on file  Stress: Not on file  Social Connections: Not on file  Intimate Partner Violence: Not on file    Physical Exam Vitals reviewed.  Constitutional:      Appearance: Normal appearance. She is normal weight.  HENT:     Head: Normocephalic.     Nose: Nose normal.     Mouth/Throat:     Mouth: Mucous membranes are moist.     Pharynx: Oropharynx is clear.  Eyes:     Conjunctiva/sclera: Conjunctivae normal.     Pupils: Pupils are equal, round, and reactive to light.  Cardiovascular:     Rate and Rhythm: Normal rate and regular rhythm.     Pulses: Normal pulses.     Heart sounds: Normal heart sounds.  Pulmonary:     Effort: Pulmonary effort is normal.     Breath sounds: Normal breath sounds.  Abdominal:     General: Abdomen is flat.     Palpations: Abdomen is soft.  Musculoskeletal:        General: Swelling present. Normal range of motion.     Right lower leg: Edema present.     Left lower leg: Edema present.     Comments: BLE edema, right leg from knee to foot is more swollen than  the left.   Skin:    Capillary Refill: Capillary refill takes less than 2 seconds.  Neurological:     General: No focal deficit present.     Mental Status: She is alert. Mental status is at baseline.  Psychiatric:        Mood and Affect: Mood normal.    Arrived for home visit for [facility] who reports feeling okay but complaining of right shoulder, right neck and right knee pain. She has been seen by her ortho doctor and has an MRI scheduled for this week for her neck and shoulder but will follow up in office for knee pain and swelling.   Bryona noted to have right lower leg edema with pitting edema +2. Avynn reports not having any changes in her diet or increase in fluid intake. Delylah denied shortness of breath or fatigue or weight gain over the last two weeks. Analyse has been med compliant over the last two weeks. Telesha feels this is related to her knee pain and swelling as she states this has happened before. I advised her to be sure she is limiting her fluid and salt intakes and monitor her weight and to follow up with ortho. She agreed.   Vitals and assessment as noted in report.   Pill box filled for two weeks.   Eliquis has not yet been delivered.   Refills: Torsemide  Losartan  Gabapentin Carvedilol   CBG-[id number]  Future Appointments  Date Time Provider [place]  02/17/2021  1:00 PM GI-315 CT 1 GI-315CT [facility] [facility]  02/17/2021  1:10 PM GI-315 CT 1 GI-315CT [facility] [facility]  03/03/2021 12:00 PM GI-BCG MM 2 GI-BCGMM [facility]  04/27/2021  7:55 AM [facility] DEVICE REMOTES [facility]  05/31/2021  9:50 AM Shamleffer, Melanie Crazier, MD [facility] None  07/27/2021  7:55 AM [facility] DEVICE REMOTES [facility]  10/26/2021  7:55 AM [facility] DEVICE REMOTES [facility]  01/25/2022  7:55  AM [facility] DEVICE REMOTES [facility]  04/26/2022  7:55 AM [facility] DEVICE REMOTES [facility]      ACTION: Home visit completed

## 2021-02-14 DIAGNOSIS — H6240 Otitis externa in other diseases classified elsewhere, unspecified ear: Secondary | ICD-10-CM | POA: Diagnosis not present

## 2021-02-14 DIAGNOSIS — B369 Superficial mycosis, unspecified: Secondary | ICD-10-CM | POA: Diagnosis not present

## 2021-02-17 ENCOUNTER — Inpatient Hospital Stay: Admission: RE | Admit: 2021-02-17 | Payer: Medicare HMO | Source: Ambulatory Visit

## 2021-02-17 ENCOUNTER — Other Ambulatory Visit: Payer: Medicare HMO

## 2021-02-27 ENCOUNTER — Other Ambulatory Visit (HOSPITAL_COMMUNITY): Payer: Self-pay

## 2021-02-27 DIAGNOSIS — I739 Peripheral vascular disease, unspecified: Secondary | ICD-10-CM | POA: Diagnosis not present

## 2021-02-27 DIAGNOSIS — M109 Gout, unspecified: Secondary | ICD-10-CM | POA: Diagnosis not present

## 2021-02-27 DIAGNOSIS — E039 Hypothyroidism, unspecified: Secondary | ICD-10-CM | POA: Diagnosis not present

## 2021-02-27 DIAGNOSIS — N1832 Chronic kidney disease, stage 3b: Secondary | ICD-10-CM | POA: Diagnosis not present

## 2021-02-27 DIAGNOSIS — I10 Essential (primary) hypertension: Secondary | ICD-10-CM | POA: Diagnosis not present

## 2021-02-27 DIAGNOSIS — Z0000 Encounter for general adult medical examination without abnormal findings: Secondary | ICD-10-CM | POA: Diagnosis not present

## 2021-02-27 DIAGNOSIS — Z853 Personal history of malignant neoplasm of breast: Secondary | ICD-10-CM | POA: Diagnosis not present

## 2021-02-27 DIAGNOSIS — E1122 Type 2 diabetes mellitus with diabetic chronic kidney disease: Secondary | ICD-10-CM | POA: Diagnosis not present

## 2021-02-27 DIAGNOSIS — Z23 Encounter for immunization: Secondary | ICD-10-CM | POA: Diagnosis not present

## 2021-02-27 DIAGNOSIS — E113413 Type 2 diabetes mellitus with severe nonproliferative diabetic retinopathy with macular edema, bilateral: Secondary | ICD-10-CM | POA: Diagnosis not present

## 2021-02-27 DIAGNOSIS — D631 Anemia in chronic kidney disease: Secondary | ICD-10-CM | POA: Diagnosis not present

## 2021-02-27 DIAGNOSIS — I5022 Chronic systolic (congestive) heart failure: Secondary | ICD-10-CM | POA: Diagnosis not present

## 2021-02-27 DIAGNOSIS — Z1389 Encounter for screening for other disorder: Secondary | ICD-10-CM | POA: Diagnosis not present

## 2021-02-27 DIAGNOSIS — I2510 Atherosclerotic heart disease of native coronary artery without angina pectoris: Secondary | ICD-10-CM | POA: Diagnosis not present

## 2021-02-27 NOTE — Progress Notes (Signed)
Paramedicine Encounter    Patient ID: Danielle [name], female    DOB: August 14, 1945, 75 y.o.   MRN: [id number]   Patient Care Team: Seward Carol, MD as PCP - General (Internal Medicine) Constance Haw, MD as PCP - Electrophysiology (Cardiology) Sueanne Margarita, MD as PCP - Sleep Medicine (Cardiology) Jorge Ny, LCSW as Social Worker (Licensed Clinical Social Worker) Shamleffer, Melanie Crazier, MD as Consulting Physician (Endocrinology)  Patient Active Problem List   Diagnosis Date Noted   Polyneuropathy associated with underlying disease ([place]) 05/04/2020   Severe nonproliferative diabetic retinopathy of left eye, with macular edema, associated with type 2 diabetes mellitus ([place]) 11/05/2019   Severe nonproliferative diabetic retinopathy of right eye, with macular edema, associated with type 2 diabetes mellitus ([place]) 11/05/2019   Retinal hemorrhage of right eye 11/05/2019   Retinal hemorrhage of left eye 11/05/2019   Type 2 diabetes mellitus with hyperglycemia, with long-term current use of insulin ([place]) 05/14/2019   Diabetes mellitus ([place]) 05/14/2019   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, with long-term current use of insulin ([place]) 05/14/2019   Type 2 diabetes mellitus with diabetic polyneuropathy, with long-term current use of insulin ([place]) 05/14/2019   Encephalopathy acute 05/20/2018   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 05/20/2018   Acute kidney failure ([place]) 05/15/2018   Primary localized osteoarthritis of right knee 05/13/2018   (HFpEF) heart failure with preserved ejection fraction ([place]) 12/20/2017   Obesity (BMI 30-39.9) 11/11/2017   OSA (obstructive sleep apnea) 02/18/2017   Essential hypertension    Diabetes mellitus without complication (HCC)    Cancer (HCC)    Thyroid disease     Current Outpatient Medications:    acetaminophen (TYLENOL) 500 MG tablet, Take 500-1,000 mg by mouth every 6 (six) hours as needed (for pain or  headaches)., Disp: , Rfl:    Alcohol Swabs PADS, See admin instructions., Disp: , Rfl:    allopurinol (ZYLOPRIM) 100 MG tablet, 1 tablet, Disp: , Rfl:    apixaban (ELIQUIS) 5 MG TABS tablet, Take 1 tablet (5 mg total) by mouth 2 (two) times daily., Disp: 60 tablet, Rfl: 11   atorvastatin (LIPITOR) 80 MG tablet, Take 1 tablet (80 mg total) by mouth at bedtime., Disp: 100 tablet, Rfl: 3   Biotin 10 MG TABS, 1 tablet, Disp: , Rfl:    Blood Glucose Monitoring Suppl (ONETOUCH VERIO) w/Device KIT, Use to check blood sugar, Disp: 1 kit, Rfl: 0   carvedilol (COREG) 25 MG tablet, 1 tablet by mouth 2 times daily with food, Disp: 60 tablet, Rfl: 11   clotrimazole-betamethasone (LOTRISONE) lotion, Apply a small amount twice daily to affected area(s) for 14 days, then use as needed for itch., Disp: , Rfl:    Continuous Blood Gluc Sensor (DEXCOM G6 SENSOR) MISC, 1 Device by Does not apply route as directed. (Patient not taking: Reported on 02/13/2021), Disp: 9 each, Rfl: 3   Continuous Blood Gluc Transmit (DEXCOM G6 TRANSMITTER) MISC, 1 Device by Does not apply route as directed. (Patient not taking: No sig reported), Disp: 1 each, Rfl: 3   empagliflozin (JARDIANCE) 10 MG TABS tablet, 1 tablet, Disp: , Rfl:    empagliflozin (JARDIANCE) 10 MG TABS tablet, Take 10 mg by mouth daily. DUPLICATE, Disp: , Rfl:    Ferrous Sulfate (IRON) 325 (65 Fe) MG TABS, Take 1 tablet by mouth daily., Disp: , Rfl:    gabapentin (NEURONTIN) 300 MG capsule, Take 300 mg by mouth 2 (two) times daily., Disp: ,  Rfl:    glucose blood (FREESTYLE LITE) test strip, 1 each by Other route in the morning, at noon, in the evening, and at bedtime. Use as instructed (Patient not taking: No sig reported), Disp: 400 each, Rfl: 3   glucose blood (ONETOUCH VERIO) test strip, Use to test blood  sugar 4 times a day, Disp: 100 each, Rfl: 12   insulin aspart (NOVOLOG FLEXPEN) 100 UNIT/ML FlexPen, Max daily 30 units per correction scale, Disp: 15 mL, Rfl:  11   insulin degludec (TRESIBA FLEXTOUCH) 100 UNIT/ML FlexTouch Pen, Inject 50 Units into the skin daily., Disp: 30 mL, Rfl: 6   isosorbide mononitrate (IMDUR) 30 MG 24 hr tablet, TAKE 1 TABLET BY MOUTH ONCE DAILY, Disp: 30 tablet, Rfl: 11   Lancets (ONETOUCH ULTRASOFT) lancets, Use as  to test blood sugar  4 times a day, Disp: 100 each, Rfl: 12   latanoprost (XALATAN) 0.005 % ophthalmic solution, 1 drop into affected eye in the evening, Disp: , Rfl:    levothyroxine (SYNTHROID, LEVOTHROID) 75 MCG tablet, Take 75 mcg by mouth daily before breakfast., Disp: , Rfl:    losartan (COZAAR) 50 MG tablet, TAKE 1 TABLET BY MOUTH AT BEDTIME, Disp: 30 tablet, Rfl: 11   LUMIGAN 0.01 % SOLN, , Disp: , Rfl:    Multiple Vitamins-Minerals (ALIVE ONCE DAILY WOMENS 50+ PO), Take 1 tablet by mouth daily., Disp: , Rfl:    mupirocin ointment (BACTROBAN) 2 %, Apply to left great toe once daily for 14 days., Disp: 30 g, Rfl: 1   nitroGLYCERIN (NITROSTAT) 0.4 MG SL tablet, DISSOLVE ONE TABLET UNDER THE TONGUE EVERY 5 MINUTES AS NEEDED FOR CHEST PAIN., Disp: 25 tablet, Rfl: 3   Omega-3 Fatty Acids (FISH OIL PO), Take 1 capsule by mouth daily., Disp: , Rfl:    potassium chloride (KLOR-CON) 10 MEQ tablet, Take 2 tablets by mouth once daily, Disp: 60 tablet, Rfl: 11   RELION PEN NEEDLE 31G/8MM 31G X 8 MM MISC, USE 1 THREE TIMES DAILY AS DIRECTED, Disp: , Rfl:    spironolactone (ALDACTONE) 25 MG tablet, Take 1 tablet (25 mg total) by mouth daily., Disp: 30 tablet, Rfl: 11   torsemide (DEMADEX) 20 MG tablet, Take 2 tablets (40 mg total) by mouth daily., Disp: 60 tablet, Rfl: 11   Travoprost, BAK Free, (TRAVATAN) 0.004 % SOLN ophthalmic solution, Place 1 drop into both eyes at bedtime. , Disp: , Rfl:  Allergies  Allergen Reactions   Other Other (See Comments)    Blood products- Patient won't accept any Other reaction(s): rash   Sulfa Antibiotics Rash   Uloric [Febuxostat] Rash     Social History   Socioeconomic  History   Marital status: Widowed    Spouse name: Not on file   Number of children: 3   Years of education: Not on file   Highest education level: Some college, no degree  Occupational History   Occupation: retired  Tobacco Use   Smoking status: Former    Packs/day: 1.00    Years: 22.00    Pack years: 22.00    Types: Cigarettes    Quit date: 1983    Years since quitting: 39.7   Smokeless tobacco: Never  Vaping Use   Vaping Use: Never used  Substance and Sexual Activity   Alcohol use: Yes    Comment: 05/22/2018 "glass of wine couple times/wk"   Drug use: Not Currently   Sexual activity: Not Currently    Birth control/protection: None, Post-menopausal  Other  Topics Concern   Not on file  Social History Narrative   Not on file   Social Determinants of Health   Financial Resource Strain: Not on file  Food Insecurity: Not on file  Transportation Needs: Not on file  Physical Activity: Not on file  Stress: Not on file  Social Connections: Not on file  Intimate Partner Violence: Not on file    Physical Exam Vitals reviewed.  Constitutional:      Appearance: She is normal weight.  HENT:     Head: Normocephalic.     Nose: Nose normal.     Mouth/Throat:     Mouth: Mucous membranes are moist.     Pharynx: Oropharynx is clear.  Eyes:     Conjunctiva/sclera: Conjunctivae normal.     Pupils: Pupils are equal, round, and reactive to light.  Cardiovascular:     Rate and Rhythm: Normal rate and regular rhythm.     Pulses: Normal pulses.     Heart sounds: Normal heart sounds.  Pulmonary:     Effort: Pulmonary effort is normal.     Breath sounds: Normal breath sounds.  Abdominal:     General: Abdomen is flat.     Palpations: Abdomen is soft.  Musculoskeletal:        General: Swelling present. Normal range of motion.     Cervical back: Normal range of motion.     Right lower leg: No edema.     Left lower leg: No edema.  Skin:    General: Skin is warm and dry.      Capillary Refill: Capillary refill takes less than 2 seconds.  Neurological:     General: No focal deficit present.     Mental Status: She is alert. Mental status is at baseline.  Psychiatric:        Mood and Affect: Mood normal.   Arrived for home visit for [place] who was alert and oriented reporting to be feeling good with no reports or complaints. Angles's vitals and assessment as noted.   Seher saw her PCP today.   She recently received her Dexcom G6 in which we set up and placed on her lower right abdomen as instructed.   I reviewed medications and filled two pill boxes.   Box #2 needs Potassium Sat, Sun, Mon in the morning.  Dexcom will need to be replaced on 10/13. Annalysia feels comfortable changing it herself.   Refills: Potassium Isosorbide Spironolactone   CBG- 237     Future Appointments  Date Time Provider [place]  03/01/2021  9:40 AM GI-W[facility] CT 1 GI-[facility]  03/01/2021  9:50 AM GI-WMC CT 1 GI-[facility]CT [facility]  03/03/2021 12:00 PM GI-BCG MM 2 GI-BCGMM [facility]  03/13/2021  9:45 AM Rankin, Clent Demark, MD RDE-RDE None  04/27/2021  7:55 AM [facility] DEVICE REMOTES [facility]  05/31/2021  9:50 AM Shamleffer, Melanie Crazier, MD [facility] None  07/27/2021  7:55 AM [facility] DEVICE REMOTES [facility]  10/26/2021  7:55 AM [facility] DEVICE REMOTES [facility]  01/25/2022  7:55 AM [facility] DEVICE REMOTES [facility]  04/26/2022  7:55 AM [facility] DEVICE REMOTES [facility]     ACTION: Home visit completed

## 2021-02-28 ENCOUNTER — Encounter (INDEPENDENT_AMBULATORY_CARE_PROVIDER_SITE_OTHER): Payer: Self-pay

## 2021-02-28 DIAGNOSIS — Z794 Long term (current) use of insulin: Secondary | ICD-10-CM | POA: Diagnosis not present

## 2021-02-28 DIAGNOSIS — H16223 Keratoconjunctivitis sicca, not specified as Sjogren's, bilateral: Secondary | ICD-10-CM | POA: Diagnosis not present

## 2021-02-28 DIAGNOSIS — Z961 Presence of intraocular lens: Secondary | ICD-10-CM | POA: Diagnosis not present

## 2021-02-28 DIAGNOSIS — H401123 Primary open-angle glaucoma, left eye, severe stage: Secondary | ICD-10-CM | POA: Diagnosis not present

## 2021-02-28 DIAGNOSIS — E113212 Type 2 diabetes mellitus with mild nonproliferative diabetic retinopathy with macular edema, left eye: Secondary | ICD-10-CM | POA: Diagnosis not present

## 2021-02-28 DIAGNOSIS — E113291 Type 2 diabetes mellitus with mild nonproliferative diabetic retinopathy without macular edema, right eye: Secondary | ICD-10-CM | POA: Diagnosis not present

## 2021-02-28 DIAGNOSIS — H401112 Primary open-angle glaucoma, right eye, moderate stage: Secondary | ICD-10-CM | POA: Diagnosis not present

## 2021-03-01 ENCOUNTER — Ambulatory Visit
Admission: RE | Admit: 2021-03-01 | Discharge: 2021-03-01 | Disposition: A | Discharge status: Home / Self Care | Payer: Medicare HMO | Source: Ambulatory Visit | Attending: Orthopedic Surgery | Admitting: Orthopedic Surgery

## 2021-03-01 DIAGNOSIS — M75120 Complete rotator cuff tear or rupture of unspecified shoulder, not specified as traumatic: Secondary | ICD-10-CM

## 2021-03-01 DIAGNOSIS — M2578 Osteophyte, vertebrae: Secondary | ICD-10-CM | POA: Diagnosis not present

## 2021-03-01 DIAGNOSIS — Z853 Personal history of malignant neoplasm of breast: Secondary | ICD-10-CM | POA: Diagnosis not present

## 2021-03-01 DIAGNOSIS — M47812 Spondylosis without myelopathy or radiculopathy, cervical region: Secondary | ICD-10-CM | POA: Diagnosis not present

## 2021-03-01 DIAGNOSIS — M542 Cervicalgia: Secondary | ICD-10-CM

## 2021-03-01 DIAGNOSIS — M19011 Primary osteoarthritis, right shoulder: Secondary | ICD-10-CM | POA: Diagnosis not present

## 2021-03-01 DIAGNOSIS — M75101 Unspecified rotator cuff tear or rupture of right shoulder, not specified as traumatic: Secondary | ICD-10-CM | POA: Diagnosis not present

## 2021-03-02 ENCOUNTER — Telehealth: Payer: Self-pay | Admitting: Internal Medicine

## 2021-03-02 MED ORDER — INSULIN GLARGINE-YFGN 100 UNIT/ML ~~LOC~~ SOLN: 75.0000 [IU] | Freq: Every day | SUBCUTANEOUS | 11 refills | Status: DC

## 2021-03-02 NOTE — Telephone Encounter (Signed)
Patient has been notified and will contact pharmacy for new medication

## 2021-03-02 NOTE — Telephone Encounter (Signed)
Patient called to advise that Danielle [name] has gone to $160 per month from $47 per month - medication is now a tier 3 medication. Patient is requesting a substitute - patient has been advised to contact insurance to find out about what is covered by [employer] back # [phone]

## 2021-03-03 ENCOUNTER — Ambulatory Visit
Admission: RE | Admit: 2021-03-03 | Discharge: 2021-03-03 | Disposition: A | Discharge status: Home / Self Care | Payer: Medicare HMO | Source: Ambulatory Visit | Attending: Internal Medicine | Admitting: Internal Medicine

## 2021-03-03 ENCOUNTER — Other Ambulatory Visit: Payer: Self-pay

## 2021-03-03 DIAGNOSIS — Z1231 Encounter for screening mammogram for malignant neoplasm of breast: Secondary | ICD-10-CM

## 2021-03-03 DIAGNOSIS — E1165 Type 2 diabetes mellitus with hyperglycemia: Secondary | ICD-10-CM | POA: Diagnosis not present

## 2021-03-06 DIAGNOSIS — M542 Cervicalgia: Secondary | ICD-10-CM | POA: Diagnosis not present

## 2021-03-13 ENCOUNTER — Other Ambulatory Visit (HOSPITAL_COMMUNITY): Payer: Self-pay

## 2021-03-13 ENCOUNTER — Encounter (INDEPENDENT_AMBULATORY_CARE_PROVIDER_SITE_OTHER): Payer: Self-pay | Admitting: Ophthalmology

## 2021-03-13 ENCOUNTER — Ambulatory Visit (INDEPENDENT_AMBULATORY_CARE_PROVIDER_SITE_OTHER): Payer: Medicare HMO | Admitting: Ophthalmology

## 2021-03-13 ENCOUNTER — Other Ambulatory Visit: Payer: Self-pay

## 2021-03-13 DIAGNOSIS — G4733 Obstructive sleep apnea (adult) (pediatric): Secondary | ICD-10-CM

## 2021-03-13 DIAGNOSIS — E113411 Type 2 diabetes mellitus with severe nonproliferative diabetic retinopathy with macular edema, right eye: Secondary | ICD-10-CM

## 2021-03-13 DIAGNOSIS — E113412 Type 2 diabetes mellitus with severe nonproliferative diabetic retinopathy with macular edema, left eye: Secondary | ICD-10-CM

## 2021-03-13 MED ORDER — BEVACIZUMAB 2.5 MG/0.1ML IZ SOSY
2.5000 mg | PREFILLED_SYRINGE | INTRAVITREAL | Status: AC | PRN
  Administered 2021-03-13: 2.5 mg via INTRAVITREAL

## 2021-03-13 NOTE — Assessment & Plan Note (Signed)
OS, improved overall from onset of treatment of CSME yet now 9 months post most recent injection.  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

## 2021-03-13 NOTE — Progress Notes (Signed)
03/13/2021     CHIEF COMPLAINT Patient presents for  Chief Complaint  Patient presents with   Retina Follow Up      HISTORY OF PRESENT ILLNESS: Danielle [name] is a 75 y.o. female who presents to the clinic today for:   HPI     Retina Follow Up   Patient presents with  Diabetic Retinopathy.  In both eyes.  This started 4 months ago.  Severity is severe.  Duration of 4 months.        Comments   4 month f/u OU with OCT  Pt states her diabetes has not been stable for the past few months. Pt c/o having significant problems with her balance recently. Pt c/o having a haze float over her vision in the left eye, comes and goes. Pt does use a CPAP. Pt c/o seeing new small floaters in both eyes, occasional. Pt denies any recent eye pain.  A1c: 8.0  EyeMeds: Travoprost QHS OU        Last edited by Reather Littler, COA on 03/13/2021 10:00 AM.      Referring physician: Seward Carol, MD [address] [place],  [place] [address]  HISTORICAL INFORMATION:   Selected notes from the MEDICAL RECORD NUMBER    Lab Results  Component Value Date   HGBA1C 8.8 (A) 01/19/2021     CURRENT MEDICATIONS: Current Outpatient Medications (Ophthalmic Drugs)  Medication Sig   latanoprost (XALATAN) 0.005 % ophthalmic solution 1 drop into affected eye in the evening   LUMIGAN 0.01 % SOLN    Travoprost, BAK Free, (TRAVATAN) 0.004 % SOLN ophthalmic solution Place 1 drop into both eyes at bedtime.    No current facility-administered medications for this visit. (Ophthalmic Drugs)   Current Outpatient Medications (Other)  Medication Sig   acetaminophen (TYLENOL) 500 MG tablet Take 500-1,000 mg by mouth every 6 (six) hours as needed (for pain or headaches).   Alcohol Swabs PADS See admin instructions.   allopurinol (ZYLOPRIM) 100 MG tablet 1 tablet   apixaban (ELIQUIS) 5 MG TABS tablet Take 1 tablet (5 mg total) by mouth 2 (two) times daily.   atorvastatin (LIPITOR) 80 MG  tablet Take 1 tablet (80 mg total) by mouth at bedtime.   Biotin 10 MG TABS 1 tablet   Blood Glucose Monitoring Suppl (ONETOUCH VERIO) w/Device KIT Use to check blood sugar   carvedilol (COREG) 25 MG tablet 1 tablet by mouth 2 times daily with food   clotrimazole-betamethasone (LOTRISONE) lotion Apply a small amount twice daily to affected area(s) for 14 days, then use as needed for itch.   Continuous Blood Gluc Sensor (DEXCOM G6 SENSOR) MISC 1 Device by Does not apply route as directed.   Continuous Blood Gluc Transmit (DEXCOM G6 TRANSMITTER) MISC 1 Device by Does not apply route as directed.   empagliflozin (JARDIANCE) 10 MG TABS tablet 1 tablet   empagliflozin (JARDIANCE) 10 MG TABS tablet Take 10 mg by mouth daily. DUPLICATE   Ferrous Sulfate (IRON) 325 (65 Fe) MG TABS Take 1 tablet by mouth daily.   gabapentin (NEURONTIN) 300 MG capsule Take 300 mg by mouth 2 (two) times daily.   glucose blood (FREESTYLE LITE) test strip 1 each by Other route in the morning, at noon, in the evening, and at bedtime. Use as instructed (Patient not taking: No sig reported)   glucose blood (ONETOUCH VERIO) test strip Use to test blood  sugar 4 times a day   insulin aspart (NOVOLOG  FLEXPEN) 100 UNIT/ML FlexPen Max daily 30 units per correction scale   insulin glargine-yfgn (SEMGLEE, YFGN,) 100 UNIT/ML injection Inject 0.75 mLs (75 Units total) into the skin daily.   isosorbide mononitrate (IMDUR) 30 MG 24 hr tablet TAKE 1 TABLET BY MOUTH ONCE DAILY   Lancets (ONETOUCH ULTRASOFT) lancets Use as  to test blood sugar  4 times a day   levothyroxine (SYNTHROID, LEVOTHROID) 75 MCG tablet Take 75 mcg by mouth daily before breakfast.   losartan (COZAAR) 50 MG tablet TAKE 1 TABLET BY MOUTH AT BEDTIME   Multiple Vitamins-Minerals (ALIVE ONCE DAILY WOMENS 50+ PO) Take 1 tablet by mouth daily.   mupirocin ointment (BACTROBAN) 2 % Apply to left great toe once daily for 14 days.   nitroGLYCERIN (NITROSTAT) 0.4 MG SL tablet  DISSOLVE ONE TABLET UNDER THE TONGUE EVERY 5 MINUTES AS NEEDED FOR CHEST PAIN.   Omega-3 Fatty Acids (FISH OIL PO) Take 1 capsule by mouth daily.   potassium chloride (KLOR-CON) 10 MEQ tablet Take 2 tablets by mouth once daily   RELION PEN NEEDLE 31G/8MM 31G X 8 MM MISC USE 1 THREE TIMES DAILY AS DIRECTED   spironolactone (ALDACTONE) 25 MG tablet Take 1 tablet (25 mg total) by mouth daily.   torsemide (DEMADEX) 20 MG tablet Take 2 tablets (40 mg total) by mouth daily.   No current facility-administered medications for this visit. (Other)      REVIEW OF SYSTEMS:    ALLERGIES Allergies  Allergen Reactions   Other Other (See Comments)    Blood products- Patient won't accept any Other reaction(s): rash   Sulfa Antibiotics Rash   Uloric [Febuxostat] Rash    PAST MEDICAL HISTORY 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[place]) 02/05/2017   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 05/20/2018   Acute kidney failure ([place]) 05/15/2018   Arthritis    "all over" (05/22/2018)   Blood dyscrasia    per pt-has small blood cells-appears as if anemic   Breast cancer, left breast ([place]) 1985   CHF (congestive heart failure) (HCC)    Complication of anesthesia    difficult to awaken from per pt   Coronary artery disease    Dyspnea    Essential hypertension    Heart disease    Heart murmur    History of gout    Hypothyroidism    Obesity (BMI 30-39.9) 11/11/2017   OSA (obstructive sleep apnea) 02/18/2017    severe obstructive sleep apnea with an AHI of 75.6/h and mild central sleep apnea with a CAI of 7.7/h.  Oxygen saturations dropped as low as 82%.   He is on CPAP at 9 cm H2O.   OSA on CPAP    Personal history of chemotherapy    Personal history of radiation therapy    Presence of permanent cardiac pacemaker    Primary localized osteoarthritis of right knee 05/13/2018   Refusal of blood transfusions as patient  is Jehovah's Witness    Type II diabetes mellitus ([place])    Past Surgical History:  Procedure Laterality Date   BREAST BIOPSY Left 1985   BREAST LUMPECTOMY Left 1985   BREAST LUMPECTOMY WITH NEEDLE LOCALIZATION AND AXILLARY LYMPH NODE DISSECTION  1985   CATARACT EXTRACTION W/ INTRAOCULAR LENS  IMPLANT, BILATERAL Bilateral    CORONARY ANGIOPLASTY WITH STENT PLACEMENT  2010, 2012,2017    2 done 2010 and 1 replaced 2012 and 2 replaced in 2017  DILATION AND CURETTAGE OF UTERUS     INSERT / REPLACE / REMOVE PACEMAKER  07/19/2014   JOINT REPLACEMENT     REPLACEMENT TOTAL KNEE Left 09/2012   THYROIDECTOMY, PARTIAL  1978   TONSILLECTOMY     TOTAL KNEE ARTHROPLASTY Right 05/13/2018   Procedure: TOTAL KNEE ARTHROPLASTY;  Surgeon: Marchia Bond, MD;  Location: [facility];  Service: Orthopedics;  Laterality: Right;   TUBAL LIGATION      FAMILY HISTORY Family History  Problem Relation Age of Onset   Multiple myeloma Mother    Heart disease Father    Other Sister    Heart disease Brother    Diabetes Sister    Other Sister     SOCIAL HISTORY Social History   Tobacco Use   Smoking status: Former    Packs/day: 1.00    Years: 22.00    Pack years: 22.00    Types: Cigarettes    Quit date: 18    Years since quitting: 39.8   Smokeless tobacco: Never  Vaping Use   Vaping Use: Never used  Substance Use Topics   Alcohol use: Yes    Comment: 05/22/2018 "glass of wine couple times/wk"   Drug use: Not Currently         OPHTHALMIC EXAM:  Base Eye Exam     Visual Acuity (ETDRS)       Right Left   Dist cc 20/40 +1 20/40 -1   Dist ph cc NI NI    Correction: Glasses         Tonometry (Tonopen, 10:09 AM)       Right Left   Pressure 13 15         Pupils       Pupils Dark Light Shape React APD   Right PERRL 4 3 Round Brisk None   Left PERRL 4 3 Round Brisk +2         Visual Fields (Counting fingers)       Left Right     Full   Restrictions Partial outer superior  temporal, superior nasal deficiencies          Extraocular Movement       Right Left    Full, Ortho Full, Ortho         Neuro/Psych     Oriented x3: Yes   Mood/Affect: Normal         Dilation     Both eyes: 1.0% Mydriacyl, 2.5% Phenylephrine @ 10:09 AM           Slit Lamp and Fundus Exam     External Exam       Right Left   External Normal Normal         Slit Lamp Exam       Right Left   Lids/Lashes Normal Normal   Conjunctiva/Sclera White and quiet White and quiet   Cornea Clear Clear   Anterior Chamber Deep and quiet Deep and quiet   Iris Round and reactive Round and reactive   Lens Centered posterior chamber intraocular lens, Open posterior capsule Centered posterior chamber intraocular lens, Open posterior capsule   Anterior Vitreous Normal Normal         Fundus Exam       Right Left   Posterior Vitreous Normal Normal   Disc Normal Normal   C/D Ratio 0.75 0.75   Macula Microaneurysms, no macular thickening Microaneurysms, macular thickening, Severe clinically significant macular edema centered threatening but not center involved OS.,  Exudates,    Vessels NPDR- Moderate NPDR- Moderate   Periphery Normal Normal            IMAGING AND PROCEDURES  Imaging and Procedures for 03/13/21  OCT, Retina - OU - Both Eyes       Right Eye Quality was good. Scan locations included subfoveal. Central Foveal Thickness: 276. Progression has been stable. Findings include normal foveal contour.   Left Eye Quality was good. Scan locations included subfoveal. Progression has improved. Findings include cystoid macular edema.   Notes CSME, with protected fovea, not center involved, temporally OS.  Slight increase of temporal involved CSME OS 9 months post most recent therapy with Avastin and 8 months post focal laser     Intravitreal Injection, Pharmacologic Agent - OS - Left Eye       Time Out 03/13/2021. 11:01 AM. Confirmed correct patient,  procedure, site, and patient consented.   Anesthesia Topical anesthesia was used. Anesthetic medications included [place] 3.5%.   Procedure Preparation included Tobramycin 0.3%, 10% betadine to eyelids, 5% betadine to ocular surface. A 30 gauge needle was used.   Injection: 2.5 mg bevacizumab 2.5 MG/0.1ML   Route: Intravitreal, Site: Left Eye   NDC: [phone], Lot: [id number]   Post-op Post injection exam found visual acuity of at least counting fingers. The patient tolerated the procedure well. There were no complications. The patient received written and verbal post procedure care education. Post injection medications included ocuflox.              ASSESSMENT/PLAN:  OSA (obstructive sleep apnea) I discussed the importance of using continued CPAP at night for folks who have diagnosed obstructive sleep apnea, as this maximizes the oxygen throughout the night to protect her eyes and the rest of her brain from deteriorating from low oxygen during nighttime sleeping    Prevention of nightly hypoxia is important for diabetic eye disease control as well as other risk factor mitigation  Severe nonproliferative diabetic retinopathy of right eye, with macular edema, associated with type 2 diabetes mellitus ([place]) OD currently not active will observe  Severe nonproliferative diabetic retinopathy of left eye, with macular edema, associated with type 2 diabetes mellitus (HCC) OS, improved overall from onset of treatment of CSME yet now 9 months post most recent injection.  We will repeat injection left eye today to control CSME temporal to [place]   1. Severe nonproliferative diabetic retinopathy of left eye, with macular edema, associated with type 2 diabetes mellitus (HCC)  J69.6789 OCT, Retina - OU - Both Eyes    Intravitreal Injection, Pharmacologic Agent - OS - Left Eye    bevacizumab (AVASTIN) SOSY 2.5 mg    2. OSA (obstructive sleep apnea)  G47.33     3. Severe  nonproliferative diabetic retinopathy of right eye, with macular edema, associated with type 2 diabetes mellitus ([place])  E11.3411       1.  OS with center threatening CSME temporally.  Slightly recurrent now 9 months post recent injection and 8 months post focal treatment.  Repeat injection Avastin OS today examination next in 2 months  2.  Patient continues on CPAP successfully to prevent nightly hypoxic damage upon superimposed severe nonproliferative diabetic retinopathy.  Compliance reviewed.  3.  Patient instructed "I have given her permission not to like the CPAP use but to use it to prevent nightly hypoxia and potential risk of systemic stroke heart attack and other disorders of untreated sleep apnea continue"  Ophthalmic  Meds Ordered this visit:  Meds ordered this encounter  Medications   bevacizumab (AVASTIN) SOSY 2.5 mg       Return in about 8 weeks (around 05/08/2021) for dilate, OS, AVASTIN OCT.  There are no Patient Instructions on file for this visit.   Explained the diagnoses, plan, and follow up with the patient and they expressed understanding.  Patient expressed understanding of the importance of proper follow up care.   Clent Demark Alvine Mostafa M.D. Diseases & Surgery of the Retina and Vitreous Retina & Diabetic [place] 03/13/21     Abbreviations: M myopia (nearsighted); A astigmatism; H hyperopia (farsighted); P presbyopia; Mrx spectacle prescription;  CTL contact lenses; OD right eye; OS left eye; OU both eyes  XT exotropia; ET esotropia; PEK punctate epithelial keratitis; PEE punctate epithelial erosions; DES dry eye syndrome; MGD meibomian gland dysfunction; ATs artificial tears; PFAT's preservative free artificial tears; [place] nuclear sclerotic cataract; PSC posterior subcapsular cataract; ERM epi-retinal membrane; PVD posterior vitreous detachment; RD retinal detachment; DM diabetes mellitus; DR diabetic retinopathy; NPDR non-proliferative diabetic retinopathy; PDR  proliferative diabetic retinopathy; CSME clinically significant macular edema; DME diabetic macular edema; dbh dot blot hemorrhages; CWS cotton wool spot; POAG primary open angle glaucoma; C/D cup-to-disc ratio; HVF humphrey visual field; GVF goldmann visual field; OCT optical coherence tomography; IOP intraocular pressure; BRVO Branch retinal vein occlusion; CRVO central retinal vein occlusion; CRAO central retinal artery occlusion; BRAO branch retinal artery occlusion; RT retinal tear; SB scleral buckle; PPV pars plana vitrectomy; VH Vitreous hemorrhage; PRP panretinal laser photocoagulation; IVK intravitreal kenalog; VMT vitreomacular traction; MH Macular hole;  NVD neovascularization of the disc; NVE neovascularization elsewhere; AREDS age related eye disease study; ARMD age related macular degeneration; POAG primary open angle glaucoma; EBMD epithelial/anterior basement membrane dystrophy; ACIOL anterior chamber intraocular lens; IOL intraocular lens; PCIOL posterior chamber intraocular lens; Phaco/IOL phacoemulsification with intraocular lens placement; [place] photorefractive keratectomy; LASIK laser assisted in situ keratomileusis; HTN hypertension; DM diabetes mellitus; COPD chronic obstructive pulmonary disease

## 2021-03-13 NOTE — Progress Notes (Signed)
Paramedicine Encounter    Patient ID: Danielle [name], female    DOB: 1946-04-11, 75 y.o.   MRN: [id number]   Patient Care Team: Seward Carol, MD as PCP - General (Internal Medicine) Constance Haw, MD as PCP - Electrophysiology (Cardiology) Sueanne Margarita, MD as PCP - Sleep Medicine (Cardiology) Jorge Ny, LCSW as Social Worker (Licensed Clinical Social Worker) Shamleffer, Melanie Crazier, MD as Consulting Physician (Endocrinology)  Patient Active Problem List   Diagnosis Date Noted   Polyneuropathy associated with underlying disease ([place]) 05/04/2020   Severe nonproliferative diabetic retinopathy of left eye, with macular edema, associated with type 2 diabetes mellitus ([place]) 11/05/2019   Severe nonproliferative diabetic retinopathy of right eye, with macular edema, associated with type 2 diabetes mellitus ([place]) 11/05/2019   Retinal hemorrhage of right eye 11/05/2019   Retinal hemorrhage of left eye 11/05/2019   Type 2 diabetes mellitus with hyperglycemia, with long-term current use of insulin ([place]) 05/14/2019   Diabetes mellitus ([place]) 05/14/2019   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, with long-term current use of insulin ([place]) 05/14/2019   Type 2 diabetes mellitus with diabetic polyneuropathy, with long-term current use of insulin ([place]) 05/14/2019   Encephalopathy acute 05/20/2018   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 05/20/2018   Acute kidney failure ([place]) 05/15/2018   Primary localized osteoarthritis of right knee 05/13/2018   (HFpEF) heart failure with preserved ejection fraction ([place]) 12/20/2017   Obesity (BMI 30-39.9) 11/11/2017   OSA (obstructive sleep apnea) 02/18/2017   Essential hypertension    Diabetes mellitus without complication (HCC)    Cancer (HCC)    Thyroid disease     Current Outpatient Medications:    acetaminophen (TYLENOL) 500 MG tablet, Take 500-1,000 mg by mouth every 6 (six) hours as needed (for pain or  headaches)., Disp: , Rfl:    Alcohol Swabs PADS, See admin instructions., Disp: , Rfl:    allopurinol (ZYLOPRIM) 100 MG tablet, 1 tablet, Disp: , Rfl:    apixaban (ELIQUIS) 5 MG TABS tablet, Take 1 tablet (5 mg total) by mouth 2 (two) times daily., Disp: 60 tablet, Rfl: 11   atorvastatin (LIPITOR) 80 MG tablet, Take 1 tablet (80 mg total) by mouth at bedtime., Disp: 100 tablet, Rfl: 3   Biotin 10 MG TABS, 1 tablet, Disp: , Rfl:    Blood Glucose Monitoring Suppl (ONETOUCH VERIO) w/Device KIT, Use to check blood sugar, Disp: 1 kit, Rfl: 0   carvedilol (COREG) 25 MG tablet, 1 tablet by mouth 2 times daily with food, Disp: 60 tablet, Rfl: 11   clotrimazole-betamethasone (LOTRISONE) lotion, Apply a small amount twice daily to affected area(s) for 14 days, then use as needed for itch., Disp: , Rfl:    Continuous Blood Gluc Sensor (DEXCOM G6 SENSOR) MISC, 1 Device by Does not apply route as directed., Disp: 9 each, Rfl: 3   Continuous Blood Gluc Transmit (DEXCOM G6 TRANSMITTER) MISC, 1 Device by Does not apply route as directed., Disp: 1 each, Rfl: 3   empagliflozin (JARDIANCE) 10 MG TABS tablet, 1 tablet, Disp: , Rfl:    empagliflozin (JARDIANCE) 10 MG TABS tablet, Take 10 mg by mouth daily. DUPLICATE, Disp: , Rfl:    Ferrous Sulfate (IRON) 325 (65 Fe) MG TABS, Take 1 tablet by mouth daily., Disp: , Rfl:    gabapentin (NEURONTIN) 300 MG capsule, Take 300 mg by mouth 2 (two) times daily., Disp: , Rfl:    glucose blood (FREESTYLE LITE) test strip, 1  each by Other route in the morning, at noon, in the evening, and at bedtime. Use as instructed (Patient not taking: No sig reported), Disp: 400 each, Rfl: 3   glucose blood (ONETOUCH VERIO) test strip, Use to test blood  sugar 4 times a day, Disp: 100 each, Rfl: 12   insulin aspart (NOVOLOG FLEXPEN) 100 UNIT/ML FlexPen, Max daily 30 units per correction scale, Disp: 15 mL, Rfl: 11   insulin glargine-yfgn (SEMGLEE, YFGN,) 100 UNIT/ML injection, Inject 0.75 mLs  (75 Units total) into the skin daily., Disp: 30 mL, Rfl: 11   isosorbide mononitrate (IMDUR) 30 MG 24 hr tablet, TAKE 1 TABLET BY MOUTH ONCE DAILY, Disp: 30 tablet, Rfl: 11   Lancets (ONETOUCH ULTRASOFT) lancets, Use as  to test blood sugar  4 times a day, Disp: 100 each, Rfl: 12   latanoprost (XALATAN) 0.005 % ophthalmic solution, 1 drop into affected eye in the evening, Disp: , Rfl:    levothyroxine (SYNTHROID, LEVOTHROID) 75 MCG tablet, Take 75 mcg by mouth daily before breakfast., Disp: , Rfl:    losartan (COZAAR) 50 MG tablet, TAKE 1 TABLET BY MOUTH AT BEDTIME, Disp: 30 tablet, Rfl: 11   LUMIGAN 0.01 % SOLN, , Disp: , Rfl:    Multiple Vitamins-Minerals (ALIVE ONCE DAILY WOMENS 50+ PO), Take 1 tablet by mouth daily., Disp: , Rfl:    mupirocin ointment (BACTROBAN) 2 %, Apply to left great toe once daily for 14 days., Disp: 30 g, Rfl: 1   nitroGLYCERIN (NITROSTAT) 0.4 MG SL tablet, DISSOLVE ONE TABLET UNDER THE TONGUE EVERY 5 MINUTES AS NEEDED FOR CHEST PAIN., Disp: 25 tablet, Rfl: 3   Omega-3 Fatty Acids (FISH OIL PO), Take 1 capsule by mouth daily., Disp: , Rfl:    potassium chloride (KLOR-CON) 10 MEQ tablet, Take 2 tablets by mouth once daily, Disp: 60 tablet, Rfl: 11   RELION PEN NEEDLE 31G/8MM 31G X 8 MM MISC, USE 1 THREE TIMES DAILY AS DIRECTED, Disp: , Rfl:    spironolactone (ALDACTONE) 25 MG tablet, Take 1 tablet (25 mg total) by mouth daily., Disp: 30 tablet, Rfl: 11   torsemide (DEMADEX) 20 MG tablet, Take 2 tablets (40 mg total) by mouth daily., Disp: 60 tablet, Rfl: 11   Travoprost, BAK Free, (TRAVATAN) 0.004 % SOLN ophthalmic solution, Place 1 drop into both eyes at bedtime. , Disp: , Rfl:  Allergies  Allergen Reactions   Other Other (See Comments)    Blood products- Patient won't accept any Other reaction(s): rash   Sulfa Antibiotics Rash   Uloric [Febuxostat] Rash     Social History   Socioeconomic History   Marital status: Widowed    Spouse name: Not on file   Number  of children: 3   Years of education: Not on file   Highest education level: Some college, no degree  Occupational History   Occupation: retired  Tobacco Use   Smoking status: Former    Packs/day: 1.00    Years: 22.00    Pack years: 22.00    Types: Cigarettes    Quit date: 1983    Years since quitting: 39.8   Smokeless tobacco: Never  Vaping Use   Vaping Use: Never used  Substance and Sexual Activity   Alcohol use: Yes    Comment: 05/22/2018 "glass of wine couple times/wk"   Drug use: Not Currently   Sexual activity: Not Currently    Birth control/protection: None, Post-menopausal  Other Topics Concern   Not on file  Social  History Narrative   Not on file   Social Determinants of Health   Financial Resource Strain: Not on file  Food Insecurity: Not on file  Transportation Needs: Not on file  Physical Activity: Not on file  Stress: Not on file  Social Connections: Not on file  Intimate Partner Violence: Not on file    Physical Exam   Arrived for home visit for [facility] who reports feeling good other than her shoulder pain which was determined to be a pinched nerve. She is following ortho for same. They did prescribe $RemoveBefor'100mg'aqPcbDJswOBh$  of Gabapentin, however she is already on $RemoveBe'300mg'wIHRYCdZl$  BID she will be calling office about same tomorrow.   Vitals are as noted. CBG- 158  She reports wearing the new Dexcom for the last two weeks up until 3 days ago and it fell off and she forgot how to put it back on, we placed a new one on today and reviewed how to do so.   No shortness of breath, chest pain, dizziness noted. Some lower leg swelling noted. I reminded Stepanie to avoid salt and extra fluids. She agreed with same.    Medications were reviewed and confirmed, pill box filled for one week.   Eliquis still has not been delivered I will call and assess same again. I will obtain sampled and bring them out for her to have enough for the weekend.   Home visit complete. I will see Ambert in one  week.   Refills: Allopurinol Torsemide     Future Appointments  Date Time Provider [place]  04/27/2021  7:55 AM [facility] DEVICE REMOTES [facility]  05/08/2021  9:45 AM Rankin, Clent Demark, MD [facility] None  05/31/2021  9:50 AM Shamleffer, Melanie Crazier, MD [facility] None  07/27/2021  7:55 AM [facility] DEVICE REMOTES [facility]  10/26/2021  7:55 AM [facility] DEVICE REMOTES [facility]  01/25/2022  7:55 AM [facility] DEVICE REMOTES [facility]  04/26/2022  7:55 AM [facility] DEVICE REMOTES [facility]     ACTION: Home visit completed

## 2021-03-13 NOTE — Assessment & Plan Note (Signed)
OD currently not active will observe

## 2021-03-13 NOTE — Assessment & Plan Note (Signed)
I discussed the importance of using continued CPAP at night for folks who have diagnosed obstructive sleep apnea, as this maximizes the oxygen throughout the night to protect her eyes and the rest of her brain from deteriorating from low oxygen during nighttime sleeping    Prevention of nightly hypoxia is important for diabetic eye disease control as well as other risk factor mitigation

## 2021-03-15 ENCOUNTER — Encounter (HOSPITAL_COMMUNITY): Payer: Self-pay

## 2021-03-15 NOTE — Progress Notes (Unsigned)
Medication Samples have been provided to the patient.  Drug name: Eliquis       Strength: 5 mg        Qty: 2  LOT: [id number]  Exp.Date: 01/2023  Dosing instructions: Take 1 tablet Twice daily   The patient has been instructed regarding the correct time, dose, and frequency of taking this medication, including desired effects and most common side effects.   Danielle [name] [name] 11:54 AM 03/15/2021

## 2021-03-20 ENCOUNTER — Other Ambulatory Visit (HOSPITAL_COMMUNITY): Payer: Self-pay

## 2021-03-20 NOTE — Progress Notes (Signed)
Paramedicine Encounter    Patient ID: Danielle [name], female    DOB: 1946-04-11, 75 y.o.   MRN: [id number]   Patient Care Team: Seward Carol, MD as PCP - General (Internal Medicine) Constance Haw, MD as PCP - Electrophysiology (Cardiology) Sueanne Margarita, MD as PCP - Sleep Medicine (Cardiology) Jorge Ny, LCSW as Social Worker (Licensed Clinical Social Worker) Shamleffer, Melanie Crazier, MD as Consulting Physician (Endocrinology)  Patient Active Problem List   Diagnosis Date Noted   Polyneuropathy associated with underlying disease ([place]) 05/04/2020   Severe nonproliferative diabetic retinopathy of left eye, with macular edema, associated with type 2 diabetes mellitus ([place]) 11/05/2019   Severe nonproliferative diabetic retinopathy of right eye, with macular edema, associated with type 2 diabetes mellitus ([place]) 11/05/2019   Retinal hemorrhage of right eye 11/05/2019   Retinal hemorrhage of left eye 11/05/2019   Type 2 diabetes mellitus with hyperglycemia, with long-term current use of insulin ([place]) 05/14/2019   Diabetes mellitus ([place]) 05/14/2019   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, with long-term current use of insulin ([place]) 05/14/2019   Type 2 diabetes mellitus with diabetic polyneuropathy, with long-term current use of insulin ([place]) 05/14/2019   Encephalopathy acute 05/20/2018   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 05/20/2018   Acute kidney failure ([place]) 05/15/2018   Primary localized osteoarthritis of right knee 05/13/2018   (HFpEF) heart failure with preserved ejection fraction ([place]) 12/20/2017   Obesity (BMI 30-39.9) 11/11/2017   OSA (obstructive sleep apnea) 02/18/2017   Essential hypertension    Diabetes mellitus without complication (HCC)    Cancer (HCC)    Thyroid disease     Current Outpatient Medications:    acetaminophen (TYLENOL) 500 MG tablet, Take 500-1,000 mg by mouth every 6 (six) hours as needed (for pain or  headaches)., Disp: , Rfl:    Alcohol Swabs PADS, See admin instructions., Disp: , Rfl:    allopurinol (ZYLOPRIM) 100 MG tablet, 1 tablet, Disp: , Rfl:    apixaban (ELIQUIS) 5 MG TABS tablet, Take 1 tablet (5 mg total) by mouth 2 (two) times daily., Disp: 60 tablet, Rfl: 11   atorvastatin (LIPITOR) 80 MG tablet, Take 1 tablet (80 mg total) by mouth at bedtime., Disp: 100 tablet, Rfl: 3   Biotin 10 MG TABS, 1 tablet, Disp: , Rfl:    Blood Glucose Monitoring Suppl (ONETOUCH VERIO) w/Device KIT, Use to check blood sugar, Disp: 1 kit, Rfl: 0   carvedilol (COREG) 25 MG tablet, 1 tablet by mouth 2 times daily with food, Disp: 60 tablet, Rfl: 11   clotrimazole-betamethasone (LOTRISONE) lotion, Apply a small amount twice daily to affected area(s) for 14 days, then use as needed for itch., Disp: , Rfl:    Continuous Blood Gluc Sensor (DEXCOM G6 SENSOR) MISC, 1 Device by Does not apply route as directed., Disp: 9 each, Rfl: 3   Continuous Blood Gluc Transmit (DEXCOM G6 TRANSMITTER) MISC, 1 Device by Does not apply route as directed., Disp: 1 each, Rfl: 3   empagliflozin (JARDIANCE) 10 MG TABS tablet, 1 tablet, Disp: , Rfl:    empagliflozin (JARDIANCE) 10 MG TABS tablet, Take 10 mg by mouth daily. DUPLICATE, Disp: , Rfl:    Ferrous Sulfate (IRON) 325 (65 Fe) MG TABS, Take 1 tablet by mouth daily., Disp: , Rfl:    gabapentin (NEURONTIN) 300 MG capsule, Take 300 mg by mouth 2 (two) times daily., Disp: , Rfl:    glucose blood (FREESTYLE LITE) test strip, 1  each by Other route in the morning, at noon, in the evening, and at bedtime. Use as instructed (Patient not taking: Reported on 03/13/2021), Disp: 400 each, Rfl: 3   glucose blood (ONETOUCH VERIO) test strip, Use to test blood  sugar 4 times a day (Patient not taking: Reported on 03/13/2021), Disp: 100 each, Rfl: 12   insulin aspart (NOVOLOG FLEXPEN) 100 UNIT/ML FlexPen, Max daily 30 units per correction scale, Disp: 15 mL, Rfl: 11   insulin glargine-yfgn  (SEMGLEE, YFGN,) 100 UNIT/ML injection, Inject 0.75 mLs (75 Units total) into the skin daily., Disp: 30 mL, Rfl: 11   isosorbide mononitrate (IMDUR) 30 MG 24 hr tablet, TAKE 1 TABLET BY MOUTH ONCE DAILY, Disp: 30 tablet, Rfl: 11   Lancets (ONETOUCH ULTRASOFT) lancets, Use as  to test blood sugar  4 times a day, Disp: 100 each, Rfl: 12   latanoprost (XALATAN) 0.005 % ophthalmic solution, 1 drop into affected eye in the evening, Disp: , Rfl:    levothyroxine (SYNTHROID, LEVOTHROID) 75 MCG tablet, Take 75 mcg by mouth daily before breakfast., Disp: , Rfl:    losartan (COZAAR) 50 MG tablet, TAKE 1 TABLET BY MOUTH AT BEDTIME, Disp: 30 tablet, Rfl: 11   LUMIGAN 0.01 % SOLN, , Disp: , Rfl:    Multiple Vitamins-Minerals (ALIVE ONCE DAILY WOMENS 50+ PO), Take 1 tablet by mouth daily., Disp: , Rfl:    mupirocin ointment (BACTROBAN) 2 %, Apply to left great toe once daily for 14 days., Disp: 30 g, Rfl: 1   nitroGLYCERIN (NITROSTAT) 0.4 MG SL tablet, DISSOLVE ONE TABLET UNDER THE TONGUE EVERY 5 MINUTES AS NEEDED FOR CHEST PAIN., Disp: 25 tablet, Rfl: 3   Omega-3 Fatty Acids (FISH OIL PO), Take 1 capsule by mouth daily., Disp: , Rfl:    potassium chloride (KLOR-CON) 10 MEQ tablet, Take 2 tablets by mouth once daily, Disp: 60 tablet, Rfl: 11   RELION PEN NEEDLE 31G/8MM 31G X 8 MM MISC, USE 1 THREE TIMES DAILY AS DIRECTED, Disp: , Rfl:    spironolactone (ALDACTONE) 25 MG tablet, Take 1 tablet (25 mg total) by mouth daily., Disp: 30 tablet, Rfl: 11   torsemide (DEMADEX) 20 MG tablet, Take 2 tablets (40 mg total) by mouth daily., Disp: 60 tablet, Rfl: 11   Travoprost, BAK Free, (TRAVATAN) 0.004 % SOLN ophthalmic solution, Place 1 drop into both eyes at bedtime. , Disp: , Rfl:  Allergies  Allergen Reactions   Other Other (See Comments)    Blood products- Patient won't accept any Other reaction(s): rash   Sulfa Antibiotics Rash   Uloric [Febuxostat] Rash     Social History   Socioeconomic History   Marital  status: Widowed    Spouse name: Not on file   Number of children: 3   Years of education: Not on file   Highest education level: Some college, no degree  Occupational History   Occupation: retired  Tobacco Use   Smoking status: Former    Packs/day: 1.00    Years: 22.00    Pack years: 22.00    Types: Cigarettes    Quit date: 1983    Years since quitting: 39.8   Smokeless tobacco: Never  Vaping Use   Vaping Use: Never used  Substance and Sexual Activity   Alcohol use: Yes    Comment: 05/22/2018 "glass of wine couple times/wk"   Drug use: Not Currently   Sexual activity: Not Currently    Birth control/protection: None, Post-menopausal  Other Topics Concern  Not on file  Social History Narrative   Not on file   Social Determinants of Health   Financial Resource Strain: Not on file  Food Insecurity: Not on file  Transportation Needs: Not on file  Physical Activity: Not on file  Stress: Not on file  Social Connections: Not on file  Intimate Partner Violence: Not on file    Physical Exam Vitals reviewed.  Constitutional:      Appearance: Normal appearance. Danielle [name] is normal weight.  HENT:     Head: Normocephalic.     Nose: Nose normal.     Mouth/Throat:     Mouth: Mucous membranes are moist.     Pharynx: Oropharynx is clear.  Eyes:     Conjunctiva/sclera: Conjunctivae normal.     Pupils: Pupils are equal, round, and reactive to light.  Cardiovascular:     Rate and Rhythm: Normal rate and regular rhythm.     Pulses: Normal pulses.     Heart sounds: Normal heart sounds.  Pulmonary:     Effort: Pulmonary effort is normal.     Breath sounds: Normal breath sounds.  Abdominal:     General: Abdomen is flat.     Palpations: Abdomen is soft.  Musculoskeletal:        General: No swelling. Normal range of motion.     Cervical back: Normal range of motion.     Right lower leg: No edema.     Left lower leg: No edema.  Skin:    General: Skin is warm and dry.      Capillary Refill: Capillary refill takes less than 2 seconds.  Neurological:     General: No focal deficit present.     Mental Status: Danielle [name] is alert. Mental status is at baseline.  Psychiatric:        Mood and Affect: Mood normal.   Arrived for home visit for [facility] who was alert and oriented reporting to be feeling good this week. I obtained vitals and assessment as noted. Some swelling noted in Danielle [name] lower legs, no chest pain, dizziness. Edom has been med compliant over the last week. I reviewed medications and filled one week of medicines in Danielle [name] pill box. [place] received Danielle [name] Eliquis via B[facility]. [employer] called for Humalog refill.   We reviewed appointments, med/diet management.   CBG- 115    I plan to see Ranessa in one week. Home visit complete.   Refills: Torsemide Allupurinol Ferrous Sulfate  Pen Needles     Future Appointments  Date Time Provider [place]  04/27/2021  7:55 AM [facility] DEVICE REMOTES [facility]  05/08/2021  9:45 AM Rankin, Clent Demark, MD [facility] None  05/31/2021  9:50 AM Shamleffer, Melanie Crazier, MD [facility] None  07/27/2021  7:55 AM [facility] DEVICE REMOTES [facility]  10/26/2021  7:55 AM [facility] DEVICE REMOTES [facility]  01/25/2022  7:55 AM [facility] DEVICE REMOTES [facility]  04/26/2022  7:55 AM [facility] DEVICE REMOTES [facility]     ACTION: Home visit completed

## 2021-03-21 ENCOUNTER — Other Ambulatory Visit (HOSPITAL_COMMUNITY): Payer: Self-pay | Admitting: *Deleted

## 2021-03-21 MED ORDER — IRON 325 (65 FE) MG PO TABS: 1.0000 | ORAL_TABLET | Freq: Every day | ORAL | 0 refills | Status: DC

## 2021-03-22 ENCOUNTER — Telehealth (HOSPITAL_COMMUNITY): Payer: Self-pay | Admitting: Licensed Clinical Social Worker

## 2021-03-22 NOTE — Telephone Encounter (Signed)
HF Paramedicine Team Based Care Meeting  HF MD- NA  HF NP - [place] NP-C   [facility] admit within the last 30 days for heart failure?   Medications concerns? Recent changes and concerns with diabetes management.   Transportation issues ? no  Education needs? no  SDOH -no  Eligible for discharge? Hopeful for DC in the next 2-3 months once new changes have been resolved and stablized.  Jorge Ny, LCSW Clinical Social Worker [facility] Desk#: [phone] Cell#: [phone]

## 2021-03-27 ENCOUNTER — Other Ambulatory Visit (HOSPITAL_COMMUNITY): Payer: Self-pay

## 2021-03-27 NOTE — Progress Notes (Signed)
Paramedicine Encounter    Patient ID: Danielle [name], female    DOB: 06-05-1945, 75 y.o.   MRN: [id number]  Today I met with Danielle [name] in the home to review medications and fill medication pill box for one week. I also placed Dexcom G6 on the lower right abdomen for patient. I plan to see Susan for full visit in one week. Home visit complete.   Refills: Losartan Carvedilol Atorvastatin    ACTION: Home visit completed

## 2021-03-28 ENCOUNTER — Other Ambulatory Visit (HOSPITAL_COMMUNITY): Payer: Self-pay | Admitting: Cardiology

## 2021-03-29 ENCOUNTER — Other Ambulatory Visit (HOSPITAL_COMMUNITY): Payer: Self-pay | Admitting: *Deleted

## 2021-03-29 MED ORDER — ATORVASTATIN CALCIUM 80 MG PO TABS: 80.0000 mg | ORAL_TABLET | Freq: Every day | ORAL | 3 refills | Status: DC

## 2021-04-02 DIAGNOSIS — E1165 Type 2 diabetes mellitus with hyperglycemia: Secondary | ICD-10-CM | POA: Diagnosis not present

## 2021-04-03 DIAGNOSIS — M542 Cervicalgia: Secondary | ICD-10-CM | POA: Diagnosis not present

## 2021-04-06 DIAGNOSIS — M47812 Spondylosis without myelopathy or radiculopathy, cervical region: Secondary | ICD-10-CM | POA: Diagnosis not present

## 2021-04-10 ENCOUNTER — Other Ambulatory Visit (HOSPITAL_COMMUNITY): Payer: Self-pay

## 2021-04-10 NOTE — Progress Notes (Signed)
Paramedicine Encounter    Patient ID: Danielle [name], female    DOB: 09/25/1945, 75 y.o.   MRN: [id number]   Patient Care Team: Polite, Ronald, MD as PCP - General (Internal Medicine) Camnitz, Will Martin, MD as PCP - Electrophysiology (Cardiology) Turner, Traci R, MD as PCP - Sleep Medicine (Cardiology) Uris, Jenna H, LCSW as Social Worker (Licensed Clinical Social Worker) Shamleffer, Ibtehal Jaralla, MD as Consulting Physician (Endocrinology)  Patient Active Problem List   Diagnosis Date Noted   Polyneuropathy associated with underlying disease (HCC) 05/04/2020   Severe nonproliferative diabetic retinopathy of left eye, with macular edema, associated with type 2 diabetes mellitus (HCC) 11/05/2019   Severe nonproliferative diabetic retinopathy of right eye, with macular edema, associated with type 2 diabetes mellitus (HCC) 11/05/2019   Retinal hemorrhage of right eye 11/05/2019   Retinal hemorrhage of left eye 11/05/2019   Type 2 diabetes mellitus with hyperglycemia, with long-term current use of insulin (HCC) 05/14/2019   Diabetes mellitus (HCC) 05/14/2019   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, with long-term current use of insulin (HCC) 05/14/2019   Type 2 diabetes mellitus with diabetic polyneuropathy, with long-term current use of insulin (HCC) 05/14/2019   Encephalopathy acute 05/20/2018   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 05/20/2018   Acute kidney failure (HCC) 05/15/2018   Primary localized osteoarthritis of right knee 05/13/2018   (HFpEF) heart failure with preserved ejection fraction (HCC) 12/20/2017   Obesity (BMI 30-39.9) 11/11/2017   OSA (obstructive sleep apnea) 02/18/2017   Essential hypertension    Diabetes mellitus without complication (HCC)    Cancer (HCC)    Thyroid disease     Current Outpatient Medications:    acetaminophen (TYLENOL) 500 MG tablet, Take 500-1,000 mg by mouth every 6 (six) hours as needed (for pain or  headaches)., Disp: , Rfl:    Alcohol Swabs PADS, See admin instructions., Disp: , Rfl:    allopurinol (ZYLOPRIM) 100 MG tablet, 1 tablet, Disp: , Rfl:    apixaban (ELIQUIS) 5 MG TABS tablet, Take 1 tablet (5 mg total) by mouth 2 (two) times daily., Disp: 60 tablet, Rfl: 11   atorvastatin (LIPITOR) 80 MG tablet, TAKE 1 TABLET BY MOUTH AT BEDTIME, Disp: 90 tablet, Rfl: 0   atorvastatin (LIPITOR) 80 MG tablet, Take 1 tablet (80 mg total) by mouth at bedtime., Disp: 90 tablet, Rfl: 3   Biotin 10 MG TABS, 1 tablet, Disp: , Rfl:    Blood Glucose Monitoring Suppl (ONETOUCH VERIO) w/Device KIT, Use to check blood sugar, Disp: 1 kit, Rfl: 0   carvedilol (COREG) 25 MG tablet, 1 tablet by mouth 2 times daily with food, Disp: 60 tablet, Rfl: 11   clotrimazole-betamethasone (LOTRISONE) lotion, Apply a small amount twice daily to affected area(s) for 14 days, then use as needed for itch., Disp: , Rfl:    Continuous Blood Gluc Sensor (DEXCOM G6 SENSOR) MISC, 1 Device by Does not apply route as directed., Disp: 9 each, Rfl: 3   Continuous Blood Gluc Transmit (DEXCOM G6 TRANSMITTER) MISC, 1 Device by Does not apply route as directed., Disp: 1 each, Rfl: 3   empagliflozin (JARDIANCE) 10 MG TABS tablet, 1 tablet, Disp: , Rfl:    empagliflozin (JARDIANCE) 10 MG TABS tablet, Take 10 mg by mouth daily. DUPLICATE, Disp: , Rfl:    Ferrous Sulfate (IRON) 325 (65 Danielle) MG TABS, Take 1 tablet (325 mg total) by mouth daily., Disp: 30 tablet, Rfl: 0   gabapentin (NEURONTIN) 300 MG   capsule, Take 300 mg by mouth 2 (two) times daily., Disp: , Rfl:    glucose blood (FREESTYLE LITE) test strip, 1 each by Other route in the morning, at noon, in the evening, and at bedtime. Use as instructed (Patient not taking: No sig reported), Disp: 400 each, Rfl: 3   glucose blood (ONETOUCH VERIO) test strip, Use to test blood  sugar 4 times a day (Patient not taking: No sig reported), Disp: 100 each, Rfl: 12   insulin aspart (NOVOLOG FLEXPEN) 100  UNIT/ML FlexPen, Max daily 30 units per correction scale, Disp: 15 mL, Rfl: 11   insulin glargine-yfgn (SEMGLEE, YFGN,) 100 UNIT/ML injection, Inject 0.75 mLs (75 Units total) into the skin daily., Disp: 30 mL, Rfl: 11   isosorbide mononitrate (IMDUR) 30 MG 24 hr tablet, TAKE 1 TABLET BY MOUTH ONCE DAILY, Disp: 30 tablet, Rfl: 11   Lancets (ONETOUCH ULTRASOFT) lancets, Use as  to test blood sugar  4 times a day, Disp: 100 each, Rfl: 12   latanoprost (XALATAN) 0.005 % ophthalmic solution, 1 drop into affected eye in the evening, Disp: , Rfl:    levothyroxine (SYNTHROID, LEVOTHROID) 75 MCG tablet, Take 75 mcg by mouth daily before breakfast., Disp: , Rfl:    losartan (COZAAR) 50 MG tablet, TAKE 1 TABLET BY MOUTH AT BEDTIME, Disp: 30 tablet, Rfl: 11   LUMIGAN 0.01 % SOLN, , Disp: , Rfl:    Multiple Vitamins-Minerals (ALIVE ONCE DAILY WOMENS 50+ PO), Take 1 tablet by mouth daily., Disp: , Rfl:    mupirocin ointment (BACTROBAN) 2 %, Apply to left great toe once daily for 14 days., Disp: 30 g, Rfl: 1   nitroGLYCERIN (NITROSTAT) 0.4 MG SL tablet, DISSOLVE ONE TABLET UNDER THE TONGUE EVERY 5 MINUTES AS NEEDED FOR CHEST PAIN., Disp: 25 tablet, Rfl: 3   Omega-3 Fatty Acids (FISH OIL PO), Take 1 capsule by mouth daily., Disp: , Rfl:    potassium chloride (KLOR-CON) 10 MEQ tablet, Take 2 tablets by mouth once daily, Disp: 60 tablet, Rfl: 11   RELION PEN NEEDLE 31G/8MM 31G X 8 MM MISC, USE 1 THREE TIMES DAILY AS DIRECTED, Disp: , Rfl:    spironolactone (ALDACTONE) 25 MG tablet, Take 1 tablet (25 mg total) by mouth daily., Disp: 30 tablet, Rfl: 11   torsemide (DEMADEX) 20 MG tablet, Take 2 tablets (40 mg total) by mouth daily., Disp: 60 tablet, Rfl: 11   Travoprost, BAK Free, (TRAVATAN) 0.004 % SOLN ophthalmic solution, Place 1 drop into both eyes at bedtime. , Disp: , Rfl:  Allergies  Allergen Reactions   Other Other (See Comments)    Blood products- Patient won't accept any Other reaction(s): rash   Sulfa  Antibiotics Rash   Uloric [Febuxostat] Rash     Social History   Socioeconomic History   Marital status: Widowed    Spouse name: Not on file   Number of children: 3   Years of education: Not on file   Highest education level: Some college, no degree  Occupational History   Occupation: retired  Tobacco Use   Smoking status: Former    Packs/day: 1.00    Years: 22.00    Pack years: 22.00    Types: Cigarettes    Quit date: 1983    Years since quitting: 39.8   Smokeless tobacco: Never  Vaping Use   Vaping Use: Never used  Substance and Sexual Activity   Alcohol use: Yes    Comment: 05/22/2018 "glass of wine couple times/wk"  Drug use: Not Currently   Sexual activity: Not Currently    Birth control/protection: None, Post-menopausal  Other Topics Concern   Not on file  Social History Narrative   Not on file   Social Determinants of Health   Financial Resource Strain: Not on file  Food Insecurity: Not on file  Transportation Needs: Not on file  Physical Activity: Not on file  Stress: Not on file  Social Connections: Not on file  Intimate Partner Violence: Not on file    Physical Exam Vitals reviewed.  Constitutional:      Appearance: Normal appearance. She is normal weight.  HENT:     Head: Normocephalic.     Nose: Nose normal.     Mouth/Throat:     Mouth: Mucous membranes are moist.     Pharynx: Oropharynx is clear.  Eyes:     Conjunctiva/sclera: Conjunctivae normal.     Pupils: Pupils are equal, round, and reactive to light.  Cardiovascular:     Rate and Rhythm: Normal rate and regular rhythm.     Pulses: Normal pulses.     Heart sounds: Normal heart sounds.  Pulmonary:     Effort: Pulmonary effort is normal.     Breath sounds: Normal breath sounds.  Abdominal:     General: Abdomen is flat.     Palpations: Abdomen is soft.  Musculoskeletal:        General: Swelling present. Normal range of motion.     Cervical back: Normal range of motion.      Right lower leg: Edema present.     Left lower leg: Edema present.  Skin:    General: Skin is warm and dry.     Capillary Refill: Capillary refill takes less than 2 seconds.  Neurological:     General: No focal deficit present.     Mental Status: She is alert. Mental status is at baseline.  Psychiatric:        Mood and Affect: Mood normal.    Arrived for home visit for Sherby who reports feeling okay. Paytin states she had an episode of vertigo yesterday and was not feeling well, but is feeling better today. Dereonna has been compliant with her medications over the last week. I reviewed medications and confirmed same. Filled one week of medication in her pill box. We reviewed appointments. No needs at this time socially. I will see Hyun in one week.   Refills: Isosorbide Potassium Torsemide Spironolactone Levothyroxine  CBG- 205    Future Appointments  Date Time Provider Department Center  04/27/2021  7:55 AM [facility] DEVICE REMOTES [facility]  05/08/2021  9:45 AM Rankin, Gary A, MD [facility] None  05/31/2021  9:50 AM Shamleffer, Ibtehal Jaralla, MD [facility] None  07/27/2021  7:55 AM [facility] DEVICE REMOTES [facility]  10/26/2021  7:55 AM [facility] DEVICE REMOTES [facility]  01/25/2022  7:55 AM [facility] DEVICE REMOTES [facility]  04/26/2022  7:55 AM [facility] DEVICE REMOTES [facility]     ACTION: Home visit completed       

## 2021-04-13 ENCOUNTER — Inpatient Hospital Stay (HOSPITAL_COMMUNITY): Payer: Medicare HMO

## 2021-04-13 ENCOUNTER — Emergency Department (HOSPITAL_COMMUNITY): Payer: Medicare HMO

## 2021-04-13 ENCOUNTER — Encounter (HOSPITAL_COMMUNITY): Payer: Self-pay

## 2021-04-13 ENCOUNTER — Other Ambulatory Visit: Payer: Self-pay

## 2021-04-13 ENCOUNTER — Inpatient Hospital Stay (HOSPITAL_COMMUNITY)
Admission: EM | Admit: 2021-04-13 | Discharge: 2021-04-24 | DRG: 485 | Disposition: A | Discharge status: Skilled Nursing Facility | Payer: Medicare HMO | Attending: Internal Medicine | Admitting: Internal Medicine

## 2021-04-13 DIAGNOSIS — E1122 Type 2 diabetes mellitus with diabetic chronic kidney disease: Secondary | ICD-10-CM | POA: Diagnosis present

## 2021-04-13 DIAGNOSIS — I480 Paroxysmal atrial fibrillation: Secondary | ICD-10-CM | POA: Diagnosis present

## 2021-04-13 DIAGNOSIS — L03115 Cellulitis of right lower limb: Secondary | ICD-10-CM | POA: Diagnosis not present

## 2021-04-13 DIAGNOSIS — I10 Essential (primary) hypertension: Secondary | ICD-10-CM | POA: Diagnosis not present

## 2021-04-13 DIAGNOSIS — I110 Hypertensive heart disease with heart failure: Secondary | ICD-10-CM | POA: Diagnosis not present

## 2021-04-13 DIAGNOSIS — M25511 Pain in right shoulder: Secondary | ICD-10-CM | POA: Diagnosis not present

## 2021-04-13 DIAGNOSIS — N179 Acute kidney failure, unspecified: Secondary | ICD-10-CM | POA: Diagnosis not present

## 2021-04-13 DIAGNOSIS — M109 Gout, unspecified: Secondary | ICD-10-CM | POA: Diagnosis present

## 2021-04-13 DIAGNOSIS — Y831 Surgical operation with implant of artificial internal device as the cause of abnormal reaction of the patient, or of later complication, without mention of misadventure at the time of the procedure: Secondary | ICD-10-CM | POA: Diagnosis present

## 2021-04-13 DIAGNOSIS — Z96652 Presence of left artificial knee joint: Secondary | ICD-10-CM | POA: Diagnosis present

## 2021-04-13 DIAGNOSIS — Z471 Aftercare following joint replacement surgery: Secondary | ICD-10-CM | POA: Diagnosis not present

## 2021-04-13 DIAGNOSIS — Z9989 Dependence on other enabling machines and devices: Secondary | ICD-10-CM | POA: Diagnosis not present

## 2021-04-13 DIAGNOSIS — Z20822 Contact with and (suspected) exposure to covid-19: Secondary | ICD-10-CM | POA: Diagnosis present

## 2021-04-13 DIAGNOSIS — M79604 Pain in right leg: Secondary | ICD-10-CM

## 2021-04-13 DIAGNOSIS — G4731 Primary central sleep apnea: Secondary | ICD-10-CM | POA: Diagnosis present

## 2021-04-13 DIAGNOSIS — I5043 Acute on chronic combined systolic (congestive) and diastolic (congestive) heart failure: Secondary | ICD-10-CM | POA: Diagnosis present

## 2021-04-13 DIAGNOSIS — Y792 Prosthetic and other implants, materials and accessory orthopedic devices associated with adverse incidents: Secondary | ICD-10-CM | POA: Diagnosis present

## 2021-04-13 DIAGNOSIS — T8453XA Infection and inflammatory reaction due to internal right knee prosthesis, initial encounter: Principal | ICD-10-CM | POA: Diagnosis present

## 2021-04-13 DIAGNOSIS — I2510 Atherosclerotic heart disease of native coronary artery without angina pectoris: Secondary | ICD-10-CM | POA: Diagnosis present

## 2021-04-13 DIAGNOSIS — G4733 Obstructive sleep apnea (adult) (pediatric): Secondary | ICD-10-CM | POA: Diagnosis not present

## 2021-04-13 DIAGNOSIS — J811 Chronic pulmonary edema: Secondary | ICD-10-CM | POA: Diagnosis not present

## 2021-04-13 DIAGNOSIS — Z807 Family history of other malignant neoplasms of lymphoid, hematopoietic and related tissues: Secondary | ICD-10-CM

## 2021-04-13 DIAGNOSIS — Z87891 Personal history of nicotine dependence: Secondary | ICD-10-CM

## 2021-04-13 DIAGNOSIS — Z7901 Long term (current) use of anticoagulants: Secondary | ICD-10-CM

## 2021-04-13 DIAGNOSIS — E869 Volume depletion, unspecified: Secondary | ICD-10-CM | POA: Diagnosis not present

## 2021-04-13 DIAGNOSIS — E8881 Metabolic syndrome: Secondary | ICD-10-CM | POA: Diagnosis present

## 2021-04-13 DIAGNOSIS — R0902 Hypoxemia: Secondary | ICD-10-CM | POA: Diagnosis not present

## 2021-04-13 DIAGNOSIS — Z6838 Body mass index (BMI) 38.0-38.9, adult: Secondary | ICD-10-CM

## 2021-04-13 DIAGNOSIS — I959 Hypotension, unspecified: Secondary | ICD-10-CM | POA: Diagnosis not present

## 2021-04-13 DIAGNOSIS — Z950 Presence of cardiac pacemaker: Secondary | ICD-10-CM

## 2021-04-13 DIAGNOSIS — R52 Pain, unspecified: Secondary | ICD-10-CM

## 2021-04-13 DIAGNOSIS — Z955 Presence of coronary angioplasty implant and graft: Secondary | ICD-10-CM

## 2021-04-13 DIAGNOSIS — Z794 Long term (current) use of insulin: Secondary | ICD-10-CM

## 2021-04-13 DIAGNOSIS — E1142 Type 2 diabetes mellitus with diabetic polyneuropathy: Secondary | ICD-10-CM | POA: Diagnosis present

## 2021-04-13 DIAGNOSIS — I517 Cardiomegaly: Secondary | ICD-10-CM | POA: Diagnosis not present

## 2021-04-13 DIAGNOSIS — I509 Heart failure, unspecified: Secondary | ICD-10-CM | POA: Diagnosis not present

## 2021-04-13 DIAGNOSIS — B9561 Methicillin susceptible Staphylococcus aureus infection as the cause of diseases classified elsewhere: Secondary | ICD-10-CM | POA: Diagnosis present

## 2021-04-13 DIAGNOSIS — M25561 Pain in right knee: Secondary | ICD-10-CM | POA: Diagnosis not present

## 2021-04-13 DIAGNOSIS — Z853 Personal history of malignant neoplasm of breast: Secondary | ICD-10-CM | POA: Diagnosis not present

## 2021-04-13 DIAGNOSIS — Z923 Personal history of irradiation: Secondary | ICD-10-CM

## 2021-04-13 DIAGNOSIS — E113413 Type 2 diabetes mellitus with severe nonproliferative diabetic retinopathy with macular edema, bilateral: Secondary | ICD-10-CM | POA: Diagnosis present

## 2021-04-13 DIAGNOSIS — D509 Iron deficiency anemia, unspecified: Secondary | ICD-10-CM | POA: Diagnosis present

## 2021-04-13 DIAGNOSIS — E039 Hypothyroidism, unspecified: Secondary | ICD-10-CM | POA: Diagnosis not present

## 2021-04-13 DIAGNOSIS — M7989 Other specified soft tissue disorders: Secondary | ICD-10-CM

## 2021-04-13 DIAGNOSIS — M009 Pyogenic arthritis, unspecified: Secondary | ICD-10-CM | POA: Diagnosis not present

## 2021-04-13 DIAGNOSIS — L299 Pruritus, unspecified: Secondary | ICD-10-CM | POA: Diagnosis not present

## 2021-04-13 DIAGNOSIS — Z96651 Presence of right artificial knee joint: Secondary | ICD-10-CM | POA: Diagnosis not present

## 2021-04-13 DIAGNOSIS — Z79890 Hormone replacement therapy (postmenopausal): Secondary | ICD-10-CM

## 2021-04-13 DIAGNOSIS — Z833 Family history of diabetes mellitus: Secondary | ICD-10-CM

## 2021-04-13 DIAGNOSIS — G9340 Encephalopathy, unspecified: Secondary | ICD-10-CM

## 2021-04-13 DIAGNOSIS — D638 Anemia in other chronic diseases classified elsewhere: Secondary | ICD-10-CM | POA: Diagnosis present

## 2021-04-13 DIAGNOSIS — N1832 Chronic kidney disease, stage 3b: Secondary | ICD-10-CM | POA: Diagnosis present

## 2021-04-13 DIAGNOSIS — I5021 Acute systolic (congestive) heart failure: Secondary | ICD-10-CM | POA: Diagnosis not present

## 2021-04-13 DIAGNOSIS — Z9221 Personal history of antineoplastic chemotherapy: Secondary | ICD-10-CM | POA: Diagnosis not present

## 2021-04-13 DIAGNOSIS — R609 Edema, unspecified: Secondary | ICD-10-CM | POA: Diagnosis not present

## 2021-04-13 DIAGNOSIS — E890 Postprocedural hypothyroidism: Secondary | ICD-10-CM | POA: Diagnosis present

## 2021-04-13 DIAGNOSIS — K5900 Constipation, unspecified: Secondary | ICD-10-CM | POA: Diagnosis not present

## 2021-04-13 DIAGNOSIS — M0080 Arthritis due to other bacteria, unspecified joint: Secondary | ICD-10-CM | POA: Diagnosis not present

## 2021-04-13 DIAGNOSIS — Z8249 Family history of ischemic heart disease and other diseases of the circulatory system: Secondary | ICD-10-CM

## 2021-04-13 DIAGNOSIS — Z743 Need for continuous supervision: Secondary | ICD-10-CM | POA: Diagnosis not present

## 2021-04-13 DIAGNOSIS — E875 Hyperkalemia: Secondary | ICD-10-CM | POA: Diagnosis not present

## 2021-04-13 DIAGNOSIS — M00061 Staphylococcal arthritis, right knee: Secondary | ICD-10-CM | POA: Diagnosis not present

## 2021-04-13 DIAGNOSIS — Z79899 Other long term (current) drug therapy: Secondary | ICD-10-CM

## 2021-04-13 DIAGNOSIS — I130 Hypertensive heart and chronic kidney disease with heart failure and stage 1 through stage 4 chronic kidney disease, or unspecified chronic kidney disease: Secondary | ICD-10-CM | POA: Diagnosis present

## 2021-04-13 DIAGNOSIS — Z9889 Other specified postprocedural states: Secondary | ICD-10-CM | POA: Diagnosis not present

## 2021-04-13 DIAGNOSIS — Z882 Allergy status to sulfonamides status: Secondary | ICD-10-CM

## 2021-04-13 DIAGNOSIS — Z7984 Long term (current) use of oral hypoglycemic drugs: Secondary | ICD-10-CM

## 2021-04-13 LAB — COMPREHENSIVE METABOLIC PANEL
ALT: 22 U/L (ref 0–44)
AST: 25 U/L (ref 15–41)
Albumin: 3.9 g/dL (ref 3.5–5.0)
Alkaline Phosphatase: 56 U/L (ref 38–126)
Anion gap: 11 (ref 5–15)
BUN: 34 mg/dL — ABNORMAL HIGH (ref 8–23)
CO2: 29 mmol/L (ref 22–32)
Calcium: 9.2 mg/dL (ref 8.9–10.3)
Chloride: 102 mmol/L (ref 98–111)
Creatinine, Ser: 1.66 mg/dL — ABNORMAL HIGH (ref 0.44–1.00)
GFR, Estimated: 32 mL/min — ABNORMAL LOW (ref >60)
Glucose, Bld: 79 mg/dL (ref 70–99)
Potassium: 4.1 mmol/L (ref 3.5–5.1)
Sodium: 142 mmol/L (ref 135–145)
Total Bilirubin: 0.8 mg/dL (ref 0.3–1.2)
Total Protein: 7.1 g/dL (ref 6.5–8.1)

## 2021-04-13 LAB — CBC WITH DIFFERENTIAL/PLATELET
Abs Immature Granulocytes: 0 10*3/uL (ref 0.00–0.07)
Basophils Absolute: 0 10*3/uL (ref 0.0–0.1)
Basophils Relative: 0 %
Eosinophils Absolute: 0 10*3/uL (ref 0.0–0.5)
Eosinophils Relative: 0 %
HCT: 35.8 % — ABNORMAL LOW (ref 36.0–46.0)
Hemoglobin: 10.7 g/dL — ABNORMAL LOW (ref 12.0–15.0)
Lymphocytes Relative: 21 %
Lymphs Abs: 2.5 10*3/uL (ref 0.7–4.0)
MCH: 20.9 pg — ABNORMAL LOW (ref 26.0–34.0)
MCHC: 29.9 g/dL — ABNORMAL LOW (ref 30.0–36.0)
MCV: 69.9 fL — ABNORMAL LOW (ref 80.0–100.0)
Monocytes Absolute: 0.7 10*3/uL (ref 0.1–1.0)
Monocytes Relative: 6 %
Neutro Abs: 8.8 10*3/uL — ABNORMAL HIGH (ref 1.7–7.7)
Neutrophils Relative %: 73 %
Platelets: 178 10*3/uL (ref 150–400)
RBC: 5.12 MIL/uL — ABNORMAL HIGH (ref 3.87–5.11)
RDW: 16.1 % — ABNORMAL HIGH (ref 11.5–15.5)
WBC: 12.1 10*3/uL — ABNORMAL HIGH (ref 4.0–10.5)
nRBC: 0 % (ref 0.0–0.2)
nRBC: 0 /100 WBC

## 2021-04-13 LAB — LACTIC ACID, PLASMA: Lactic Acid, Venous: 1.1 mmol/L (ref 0.5–1.9)

## 2021-04-13 LAB — HEMOGLOBIN A1C
Hgb A1c MFr Bld: 7.5 % — ABNORMAL HIGH (ref 4.8–5.6)
Mean Plasma Glucose: 168.55 mg/dL

## 2021-04-13 LAB — RESP PANEL BY RT-PCR (FLU A&B, COVID) ARPGX2
Influenza A by PCR: NEGATIVE
Influenza B by PCR: NEGATIVE
SARS Coronavirus 2 by RT PCR: NEGATIVE

## 2021-04-13 LAB — CBG MONITORING, ED
Glucose-Capillary: 82 mg/dL (ref 70–99)
Glucose-Capillary: 79 mg/dL (ref 70–99)
Glucose-Capillary: 119 mg/dL — ABNORMAL HIGH (ref 70–99)

## 2021-04-13 LAB — SEDIMENTATION RATE: Sed Rate: 10 mm/hr (ref 0–22)

## 2021-04-13 LAB — C-REACTIVE PROTEIN: CRP: 1.7 mg/dL — ABNORMAL HIGH (ref <1.0)

## 2021-04-13 LAB — APTT: aPTT: 31 seconds (ref 24–36)

## 2021-04-13 LAB — HEPARIN LEVEL (UNFRACTIONATED): Heparin Unfractionated: >1.1 IU/mL — ABNORMAL HIGH (ref 0.30–0.70)

## 2021-04-13 LAB — PROCALCITONIN: Procalcitonin: <0.1 ng/mL

## 2021-04-13 MED ORDER — ATORVASTATIN CALCIUM 80 MG PO TABS
80.0000 mg | ORAL_TABLET | Freq: Every day | ORAL | Status: DC
  Administered 2021-04-13 – 2021-04-23 (×11): 80 mg via ORAL
  Filled 2021-04-13 (×5): qty 1
  Filled 2021-04-13: qty 2
  Filled 2021-04-13 (×5): qty 1

## 2021-04-13 MED ORDER — LOSARTAN POTASSIUM 50 MG PO TABS
50.0000 mg | ORAL_TABLET | Freq: Every day | ORAL | Status: DC
  Administered 2021-04-14: 50 mg via ORAL
  Filled 2021-04-13: qty 1

## 2021-04-13 MED ORDER — INSULIN GLARGINE-YFGN 100 UNIT/ML ~~LOC~~ SOLN: 75.0000 [IU] | Freq: Every day | SUBCUTANEOUS | Status: DC

## 2021-04-13 MED ORDER — HYDRALAZINE HCL 25 MG PO TABS: 25.0000 mg | ORAL_TABLET | Freq: Four times a day (QID) | ORAL | Status: DC | PRN

## 2021-04-13 MED ORDER — INSULIN ASPART 100 UNIT/ML IJ SOLN
0.0000 [IU] | Freq: Three times a day (TID) | INTRAMUSCULAR | Status: DC
  Administered 2021-04-15 (×2): 3 [IU] via SUBCUTANEOUS
  Administered 2021-04-15 – 2021-04-16 (×3): 2 [IU] via SUBCUTANEOUS
  Administered 2021-04-16: 5 [IU] via SUBCUTANEOUS
  Administered 2021-04-17 (×2): 3 [IU] via SUBCUTANEOUS
  Administered 2021-04-18 – 2021-04-23 (×10): 2 [IU] via SUBCUTANEOUS
  Administered 2021-04-24: 18:00:00 3 [IU] via SUBCUTANEOUS
  Administered 2021-04-24 (×2): 2 [IU] via SUBCUTANEOUS

## 2021-04-13 MED ORDER — SENNOSIDES-DOCUSATE SODIUM 8.6-50 MG PO TABS
1.0000 | ORAL_TABLET | Freq: Every evening | ORAL | Status: DC | PRN
  Administered 2021-04-19: 1 via ORAL
  Filled 2021-04-13: qty 1

## 2021-04-13 MED ORDER — HYDROMORPHONE HCL 1 MG/ML IJ SOLN
0.5000 mg | INTRAMUSCULAR | Status: DC | PRN
  Administered 2021-04-14: 1 mg via INTRAVENOUS
  Filled 2021-04-13: qty 1

## 2021-04-13 MED ORDER — FERROUS SULFATE 325 (65 FE) MG PO TABS
325.0000 mg | ORAL_TABLET | Freq: Every day | ORAL | Status: DC
  Administered 2021-04-15 – 2021-04-24 (×10): 325 mg via ORAL
  Filled 2021-04-13 (×10): qty 1

## 2021-04-13 MED ORDER — FUROSEMIDE 10 MG/ML IJ SOLN
40.0000 mg | Freq: Every day | INTRAMUSCULAR | Status: DC
  Administered 2021-04-13 – 2021-04-14 (×2): 40 mg via INTRAVENOUS
  Filled 2021-04-13 (×2): qty 4

## 2021-04-13 MED ORDER — INSULIN GLARGINE-YFGN 100 UNIT/ML ~~LOC~~ SOLN
10.0000 [IU] | Freq: Every day | SUBCUTANEOUS | Status: DC
  Administered 2021-04-13 – 2021-04-15 (×3): 10 [IU] via SUBCUTANEOUS
  Filled 2021-04-13 (×4): qty 0.1

## 2021-04-13 MED ORDER — LATANOPROST 0.005 % OP SOLN
1.0000 [drp] | Freq: Every day | OPHTHALMIC | Status: DC
  Administered 2021-04-13 – 2021-04-23 (×10): 1 [drp] via OPHTHALMIC
  Filled 2021-04-13 (×2): qty 2.5

## 2021-04-13 MED ORDER — BISACODYL 5 MG PO TBEC
5.0000 mg | DELAYED_RELEASE_TABLET | Freq: Every day | ORAL | Status: DC | PRN
  Administered 2021-04-17 – 2021-04-19 (×2): 5 mg via ORAL
  Filled 2021-04-13 (×2): qty 1

## 2021-04-13 MED ORDER — PREDNISONE 20 MG PO TABS: 50.0000 mg | ORAL_TABLET | Freq: Two times a day (BID) | ORAL | Status: DC

## 2021-04-13 MED ORDER — APIXABAN 5 MG PO TABS: 5.0000 mg | ORAL_TABLET | Freq: Two times a day (BID) | ORAL | Status: DC

## 2021-04-13 MED ORDER — ONDANSETRON HCL 4 MG/2ML IJ SOLN
4.0000 mg | Freq: Once | INTRAMUSCULAR | Status: AC
  Administered 2021-04-13: 10:00:00 4 mg via INTRAVENOUS
  Filled 2021-04-13: qty 2

## 2021-04-13 MED ORDER — OXYCODONE HCL 5 MG PO TABS
5.0000 mg | ORAL_TABLET | ORAL | Status: DC | PRN
  Administered 2021-04-13 – 2021-04-18 (×10): 5 mg via ORAL
  Filled 2021-04-13 (×11): qty 1

## 2021-04-13 MED ORDER — SODIUM CHLORIDE 0.9 % IV SOLN
2.0000 g | INTRAVENOUS | Status: DC
  Administered 2021-04-14 – 2021-04-17 (×4): 2 g via INTRAVENOUS
  Filled 2021-04-13 (×4): qty 20

## 2021-04-13 MED ORDER — ALLOPURINOL 100 MG PO TABS
100.0000 mg | ORAL_TABLET | Freq: Every day | ORAL | Status: DC
  Administered 2021-04-13 – 2021-04-24 (×12): 100 mg via ORAL
  Filled 2021-04-13 (×12): qty 1

## 2021-04-13 MED ORDER — CARVEDILOL 25 MG PO TABS
25.0000 mg | ORAL_TABLET | Freq: Two times a day (BID) | ORAL | Status: DC
  Administered 2021-04-13 – 2021-04-24 (×20): 25 mg via ORAL
  Filled 2021-04-13 (×8): qty 1
  Filled 2021-04-13: qty 2
  Filled 2021-04-13 (×7): qty 1
  Filled 2021-04-13: qty 8
  Filled 2021-04-13 (×4): qty 1

## 2021-04-13 MED ORDER — HEPARIN (PORCINE) 25000 UT/250ML-% IV SOLN
1250.0000 [IU]/h | INTRAVENOUS | Status: DC
  Administered 2021-04-13: 21:00:00 1100 [IU]/h via INTRAVENOUS
  Filled 2021-04-13: qty 250

## 2021-04-13 MED ORDER — ACETAMINOPHEN 325 MG PO TABS
650.0000 mg | ORAL_TABLET | Freq: Four times a day (QID) | ORAL | Status: DC | PRN
  Administered 2021-04-14 – 2021-04-24 (×6): 650 mg via ORAL
  Filled 2021-04-13 (×6): qty 2

## 2021-04-13 MED ORDER — SPIRONOLACTONE 25 MG PO TABS: 25.0000 mg | ORAL_TABLET | Freq: Every day | ORAL | Status: DC

## 2021-04-13 MED ORDER — LEVOTHYROXINE SODIUM 75 MCG PO TABS
75.0000 ug | ORAL_TABLET | Freq: Every day | ORAL | Status: DC
  Administered 2021-04-14 – 2021-04-24 (×11): 75 ug via ORAL
  Filled 2021-04-13 (×12): qty 1

## 2021-04-13 MED ORDER — LIDOCAINE-EPINEPHRINE 1 %-1:100000 IJ SOLN
10.0000 mL | Freq: Once | INTRAMUSCULAR | Status: AC
  Administered 2021-04-13: 15:00:00 10 mL via INTRADERMAL
  Filled 2021-04-13: qty 1

## 2021-04-13 MED ORDER — ONDANSETRON HCL 4 MG PO TABS: 4.0000 mg | ORAL_TABLET | Freq: Four times a day (QID) | ORAL | Status: DC | PRN

## 2021-04-13 MED ORDER — ISOSORBIDE MONONITRATE ER 30 MG PO TB24: 30.0000 mg | ORAL_TABLET | Freq: Every day | ORAL | Status: DC

## 2021-04-13 MED ORDER — SODIUM CHLORIDE 0.9 % IV SOLN
2.0000 g | Freq: Once | INTRAVENOUS | Status: AC
  Administered 2021-04-13: 11:00:00 2 g via INTRAVENOUS
  Filled 2021-04-13: qty 20

## 2021-04-13 MED ORDER — ATORVASTATIN CALCIUM 40 MG PO TABS: 80.0000 mg | ORAL_TABLET | Freq: Every day | ORAL | Status: DC

## 2021-04-13 MED ORDER — GABAPENTIN 300 MG PO CAPS
300.0000 mg | ORAL_CAPSULE | Freq: Two times a day (BID) | ORAL | Status: DC
  Administered 2021-04-13 – 2021-04-24 (×22): 300 mg via ORAL
  Filled 2021-04-13 (×22): qty 1

## 2021-04-13 MED ORDER — ONDANSETRON HCL 4 MG/2ML IJ SOLN: 4.0000 mg | Freq: Four times a day (QID) | INTRAMUSCULAR | Status: DC | PRN

## 2021-04-13 MED ORDER — MORPHINE SULFATE (PF) 4 MG/ML IV SOLN
4.0000 mg | Freq: Once | INTRAVENOUS | Status: AC
  Administered 2021-04-13: 10:00:00 4 mg via INTRAVENOUS
  Filled 2021-04-13: qty 1

## 2021-04-13 NOTE — ED Notes (Signed)
2 RN's with unsuccessful IV attempts. Notified MD and placed an IV team consult.

## 2021-04-13 NOTE — ED Notes (Signed)
Got patient moved over to the bed on the monitor patient is resting with family at bedside and call bell in reach

## 2021-04-13 NOTE — Progress Notes (Signed)
Discussed case with Dr. Zachery Dakins. Patient seen and examined. She is familiar to me as I just saw her in our office 10 days ago with Dr. Mardelle Matte. Today she is somnolent, falling in and out of sleep. However, I am able to keep her awake and get her to speak to me. She states her knee pain is severe, "its inside knee pain", worse with any movement and weight bearing. Cries out in in pain with any light touch to the right knee. Patient agreed to repeat aspiration, attempted aspiration, however, was unable to pull off any fluid. Will plan to let patient eat tonight and see how she does with IV antibiotics and attempted mobility tomorrow. Will continue to follow.     PRE-PROCEDURE DIAGNOSIS:  Painful prosthetic right knee POST-OPERATIVE DIAGNOSIS:  Same PROCEDURE:  Aspiration right knee  PROCEDURE DETAILS: After informed verbal consent was obtained the superolateral portal was prepped with chlorhexidine. Approximately 3 mL of lidocaine were injected superficially and then into the deeper tissues and into the joint space. Medicine was left to sit for several minutes to take effect. Next, a 10 mL syringe with an 18-gauge needle was used to attempt aspiration. Patient tolerated however, after a few attempts to pull fluid, patient asked me to discontinue due to increased pain. Sterile 4x4 gauze was placed over the aspiration site. Patient tolerated the procedure, there were no complications.   04/13/2021 5:40 PM   Merlene Pulling, PA-C

## 2021-04-13 NOTE — ED Provider Notes (Signed)
  [facility] EMERGENCY DEPARTMENT Provider Note   CSN: [id number] Arrival date & time: 04/13/21  0250   Procedures .Joint Aspiration/Arthrocentesis  Date/Time: 04/13/2021 3:53 PM Performed by: Cherly Hensen, DO Authorized by: Teressa Lower, MD   Consent:    Consent obtained:  Verbal   Consent given by:  Patient (Also given by daughter via phone as proxy as pt appears somnolent)   Risks, benefits, and alternatives were discussed: yes     Risks discussed:  Bleeding, infection, pain, incomplete drainage and nerve damage   Alternatives discussed:  No treatment, delayed treatment, alternative treatment, observation and referral Universal protocol:    Procedure explained and questions answered to patient or proxy's satisfaction: yes     Relevant documents present and verified: yes     Test results available: yes     Imaging studies available: yes     Required blood products, implants, devices, and special equipment available: yes     Site/side marked: yes     Immediately prior to procedure, a time out was called: yes     Patient identity confirmed:  Verbally with patient and arm band Location:    Location:  Knee   Knee:  R knee Anesthesia:    Anesthesia method:  Local infiltration   Local anesthetic:  Lidocaine 1% WITH epi Procedure details:    Preparation: Patient was prepped and draped in usual sterile fashion     Needle gauge:  34 G   Ultrasound guidance: no     Approach:  Superior   Aspirate amount:  0   Steroid injected: no     Specimen collected: no   Post-procedure details:    Dressing:  Adhesive bandage and sterile dressing   Procedure completion:  Tolerated Comments:     Dry tap       Cherly Hensen, DO 04/13/21 [place], Wonda [place], MD 04/15/21 2352

## 2021-04-13 NOTE — ED Provider Notes (Signed)
[facility] EMERGENCY DEPARTMENT Provider Note   CSN: [id number] Arrival date & time: 04/13/21  0250     History Chief Complaint  Patient presents with   Knee Pain    R    Anaiza Behrens is a 75 y.o. female with PMH CHF, gout, CAD, hypothyroidism, HTN, previous bilateral TKA performed by Dr. Mardelle Matte who presents emergency department for right lower extremity pain and swelling.  Patient states that her right knee pain and swelling has progressed after starting at 8 PM last night.  She arrives febrile and states that she is unable to bear weight on the right knee.  She arrives with noticeable asymmetric swelling of the right lower extremity with mild erythema extending from the knee down into the midfoot.  Denies chest pain, shortness of breath, abdominal pain, nausea, vomiting or other systemic symptoms.  Denies numbness, tingling, weakness of lower extremity.   Knee Pain Associated symptoms: fever   Associated symptoms: no back pain       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[place]) 02/05/2017   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 05/20/2018   Acute kidney failure ([place]) 05/15/2018   Arthritis    "all over" (05/22/2018)   Blood dyscrasia    per pt-has small blood cells-appears as if anemic   Breast cancer, left breast ([place]) 1985   CHF (congestive heart failure) (HCC)    Complication of anesthesia    difficult to awaken from per pt   Coronary artery disease    Dyspnea    Essential hypertension    Heart disease    Heart murmur    History of gout    Hypothyroidism    Obesity (BMI 30-39.9) 11/11/2017   OSA (obstructive sleep apnea) 02/18/2017    severe obstructive sleep apnea with an AHI of 75.6/h and mild central sleep apnea with a CAI of 7.7/h.  Oxygen saturations dropped as low as 82%.   He is on CPAP at 9 cm H2O.   OSA on CPAP    Personal history of chemotherapy     Personal history of radiation therapy    Presence of permanent cardiac pacemaker    Primary localized osteoarthritis of right knee 05/13/2018   Refusal of blood transfusions as patient is Jehovah's Witness    Type II diabetes mellitus ([place])     Patient Active Problem List   Diagnosis Date Noted   Septic arthritis ([place]) 04/13/2021   Polyneuropathy associated with underlying disease ([place]) 05/04/2020   Severe nonproliferative diabetic retinopathy of left eye, with macular edema, associated with type 2 diabetes mellitus ([place]) 11/05/2019   Severe nonproliferative diabetic retinopathy of right eye, with macular edema, associated with type 2 diabetes mellitus ([place]) 11/05/2019   Retinal hemorrhage of right eye 11/05/2019   Retinal hemorrhage of left eye 11/05/2019   Type 2 diabetes mellitus with hyperglycemia, with long-term current use of insulin ([place]) 05/14/2019   Diabetes mellitus ([place]) 05/14/2019   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, with long-term current use of insulin ([place]) 05/14/2019   Type 2 diabetes mellitus with diabetic polyneuropathy, with long-term current use of insulin ([place]) 05/14/2019   Encephalopathy acute 05/20/2018   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 05/20/2018   Acute kidney failure ([place]) 05/15/2018   Primary localized osteoarthritis of right knee 05/13/2018   (HFpEF) heart failure with preserved ejection fraction ([place]) 12/20/2017   Obesity (BMI  30-39.9) 11/11/2017   OSA (obstructive sleep apnea) 02/18/2017   Essential hypertension    Diabetes mellitus without complication ([place])    Cancer ([place])    Thyroid disease     Past Surgical History:  Procedure Laterality Date   BREAST BIOPSY Left 1985   BREAST LUMPECTOMY Left 1985   BREAST LUMPECTOMY WITH NEEDLE LOCALIZATION AND AXILLARY LYMPH NODE DISSECTION  1985   CATARACT EXTRACTION W/ INTRAOCULAR LENS  IMPLANT, BILATERAL Bilateral    CORONARY ANGIOPLASTY WITH STENT PLACEMENT   2010, 2012,2017    2 done 2010 and 1 replaced 2012 and 2 replaced in 2017   [place] / REPLACE / REMOVE PACEMAKER  07/19/2014   JOINT REPLACEMENT     REPLACEMENT TOTAL KNEE Left 09/2012   THYROIDECTOMY, PARTIAL  1978   TONSILLECTOMY     TOTAL KNEE ARTHROPLASTY Right 05/13/2018   Procedure: TOTAL KNEE ARTHROPLASTY;  Surgeon: Marchia Bond, MD;  Location: [facility];  Service: Orthopedics;  Laterality: Right;   TUBAL LIGATION       OB History   No obstetric history on file.     Family History  Problem Relation Age of Onset   Multiple myeloma Mother    Heart disease Father    Other Sister    Heart disease Brother    Diabetes Sister    Other Sister     Social History   Tobacco Use   Smoking status: Former    Packs/day: 1.00    Years: 22.00    Pack years: 22.00    Types: Cigarettes    Quit date: 1983    Years since quitting: 39.9   Smokeless tobacco: Never  Vaping Use   Vaping Use: Never used  Substance Use Topics   Alcohol use: Yes    Comment: 05/22/2018 "glass of wine couple times/wk"   Drug use: Not Currently    Home Medications Prior to Admission medications   Medication Sig Start Date End Date Taking? Authorizing Provider  acetaminophen (TYLENOL) 500 MG tablet Take 500-1,000 mg by mouth every 6 (six) hours as needed (for pain or headaches).    [provider]  Alcohol Swabs PADS See admin instructions. 07/23/18   [provider]  allopurinol (ZYLOPRIM) 100 MG tablet 1 tablet    [provider]  apixaban (ELIQUIS) 5 MG TABS tablet Take 1 tablet (5 mg total) by mouth 2 (two) times daily. 10/19/20   Bensimhon, Shaune Pascal, MD  atorvastatin (LIPITOR) 80 MG tablet TAKE 1 TABLET BY MOUTH AT BEDTIME 03/29/21   Bensimhon, Shaune Pascal, MD  atorvastatin (LIPITOR) 80 MG tablet Take 1 tablet (80 mg total) by mouth at bedtime. 03/29/21   Bensimhon, Shaune Pascal, MD  Biotin 10 MG TABS 1 tablet    [provider]  Blood  Glucose Monitoring Suppl (ONETOUCH VERIO) w/Device KIT Use to check blood sugar 01/10/21   Shamleffer, Melanie Crazier, MD  carvedilol (COREG) 25 MG tablet 1 tablet by mouth 2 times daily with food 10/19/20   Bensimhon, Shaune Pascal, MD  clotrimazole-betamethasone (LOTRISONE) lotion Apply a small amount twice daily to affected area(s) for 14 days, then use as needed for itch. 06/16/20   [provider]  Continuous Blood Gluc Sensor (DEXCOM G6 SENSOR) MISC 1 Device by Does not apply route as directed. 01/19/21   Shamleffer, Melanie Crazier, MD  Continuous Blood Gluc Transmit (DEXCOM G6 TRANSMITTER) MISC 1 Device by Does not apply route as directed. 01/19/21  Shamleffer, Melanie Crazier, MD  empagliflozin (JARDIANCE) 10 MG TABS tablet 1 tablet    [provider]  empagliflozin (JARDIANCE) 10 MG TABS tablet Take 10 mg by mouth daily. DUPLICATE    [provider]  Ferrous Sulfate (IRON) 325 (65 Fe) MG TABS Take 1 tablet (325 mg total) by mouth daily. 03/21/21   Bensimhon, Shaune Pascal, MD  gabapentin (NEURONTIN) 300 MG capsule Take 300 mg by mouth 2 (two) times daily.    [provider]  glucose blood (FREESTYLE LITE) test strip 1 each by Other route in the morning, at noon, in the evening, and at bedtime. Use as instructed Patient not taking: No sig reported 12/21/20   Shamleffer, Melanie Crazier, MD  glucose blood (ONETOUCH VERIO) test strip Use to test blood  sugar 4 times a day Patient not taking: No sig reported 01/10/21   Shamleffer, Melanie Crazier, MD  insulin aspart (NOVOLOG FLEXPEN) 100 UNIT/ML FlexPen Max daily 30 units per correction scale 11/10/20   Shamleffer, Melanie Crazier, MD  insulin glargine-yfgn (SEMGLEE, YFGN,) 100 UNIT/ML injection Inject 0.75 mLs (75 Units total) into the skin daily. 03/02/21   Shamleffer, Melanie Crazier, MD  isosorbide mononitrate (IMDUR) 30 MG 24 hr tablet TAKE 1 TABLET BY MOUTH ONCE DAILY 10/19/20   Bensimhon, Shaune Pascal, MD  Lancets [facility]  ULTRASOFT) lancets Use as  to test blood sugar  4 times a day Patient not taking: Reported on 04/10/2021 01/10/21   Shamleffer, Melanie Crazier, MD  latanoprost (XALATAN) 0.005 % ophthalmic solution 1 drop into affected eye in the evening    [provider]  levothyroxine (SYNTHROID, LEVOTHROID) 75 MCG tablet Take 75 mcg by mouth daily before breakfast.    [provider]  losartan (COZAAR) 50 MG tablet TAKE 1 TABLET BY MOUTH AT BEDTIME 10/19/20   Bensimhon, Shaune Pascal, MD  LUMIGAN 0.01 % SOLN  07/23/20   [provider]  Multiple Vitamins-Minerals (ALIVE ONCE DAILY WOMENS 50+ PO) Take 1 tablet by mouth daily.    [provider]  mupirocin ointment (BACTROBAN) 2 % Apply to left great toe once daily for 14 days. 10/10/20   Marzetta Board, DPM  nitroGLYCERIN (NITROSTAT) 0.4 MG SL tablet DISSOLVE ONE TABLET UNDER THE TONGUE EVERY 5 MINUTES AS NEEDED FOR CHEST PAIN. 01/14/18   Camnitz, Ocie Doyne, MD  Omega-3 Fatty Acids (FISH OIL PO) Take 1 capsule by mouth daily.    [provider]  potassium chloride (KLOR-CON) 10 MEQ tablet Take 2 tablets by mouth once daily 12/08/20   Bensimhon, Shaune Pascal, MD  RELION PEN NEEDLE 31G/8MM 31G X 8 MM MISC USE 1 THREE TIMES DAILY AS DIRECTED 08/25/20   [provider]  spironolactone (ALDACTONE) 25 MG tablet Take 1 tablet (25 mg total) by mouth daily. 10/19/20   Bensimhon, Shaune Pascal, MD  torsemide (DEMADEX) 20 MG tablet Take 2 tablets (40 mg total) by mouth daily. 10/19/20   Bensimhon, Shaune Pascal, MD  Travoprost, BAK Free, (TRAVATAN) 0.004 % SOLN ophthalmic solution Place 1 drop into both eyes at bedtime.     [provider]    Allergies    Other, Sulfa antibiotics, and Uloric [febuxostat]  Review of Systems   Review of Systems  Constitutional:  Positive for fever. Negative for chills.  HENT:  Negative for ear pain and sore throat.   Eyes:  Negative for pain and visual disturbance.  Respiratory:  Negative for  cough and shortness of breath.   Cardiovascular:  Negative  for chest pain and palpitations.  Gastrointestinal:  Negative for abdominal pain and vomiting.  Genitourinary:  Negative for dysuria and hematuria.  Musculoskeletal:  Positive for arthralgias. Negative for back pain.  Skin:  Negative for color change and rash.  Neurological:  Negative for seizures and syncope.  All other systems reviewed and are negative.  Physical Exam Updated Vital Signs BP 130/62   Pulse 89   Temp 99.7 F (37.6 C) (Oral)   Resp 20   Ht _0  (1.626 m)   Wt 101.2 kg   SpO2 95%   BMI 38.28 kg/m   Physical Exam Vitals and nursing note reviewed.  Constitutional:      General: She is not in acute distress.    Appearance: She is well-developed.  HENT:     Head: Normocephalic and atraumatic.  Eyes:     Conjunctiva/sclera: Conjunctivae normal.  Cardiovascular:     Rate and Rhythm: Normal rate and regular rhythm.     Heart sounds: No murmur heard. Pulmonary:     Effort: Pulmonary effort is normal. No respiratory distress.     Breath sounds: Normal breath sounds.  Abdominal:     Palpations: Abdomen is soft.     Tenderness: There is no abdominal tenderness.  Musculoskeletal:        General: Swelling and tenderness (Right knee) present.     Cervical back: Neck supple.     Right lower leg: Edema present.     Left lower leg: Edema present.  Skin:    General: Skin is warm and dry.     Capillary Refill: Capillary refill takes less than 2 seconds.     Findings: Erythema (Right knee down into midfoot) present.  Neurological:     Mental Status: She is alert.  Psychiatric:        Mood and Affect: Mood normal.    ED Results / Procedures / Treatments   Labs (all labs ordered are listed, but only abnormal results are displayed) Labs Reviewed  CBC WITH DIFFERENTIAL/PLATELET - Abnormal; Notable for the following components:      Result Value   WBC 12.1 (*)    RBC 5.12 (*)    Hemoglobin 10.7 (*)     HCT 35.8 (*)    MCV 69.9 (*)    MCH 20.9 (*)    MCHC 29.9 (*)    RDW 16.1 (*)    Neutro Abs 8.8 (*)    All other components within normal limits  COMPREHENSIVE METABOLIC PANEL - Abnormal; Notable for the following components:   BUN 34 (*)    Creatinine, Ser 1.66 (*)    GFR, Estimated 32 (*)    All other components within normal limits  C-REACTIVE PROTEIN - Abnormal; Notable for the following components:   CRP 1.7 (*)    All other components within normal limits  RESP PANEL BY RT-PCR (FLU A&B, COVID) ARPGX2  BODY FLUID CULTURE W GRAM STAIN  GRAM STAIN  CULTURE, BLOOD (ROUTINE X 2)  CULTURE, BLOOD (ROUTINE X 2)  SEDIMENTATION RATE  PATHOLOGIST SMEAR REVIEW  SYNOVIAL CELL COUNT + DIFF, W/ CRYSTALS  GLUCOSE, BODY FLUID OTHER            LACTIC ACID, PLASMA  LACTIC ACID, PLASMA  HEMOGLOBIN A1C  PROCALCITONIN  CBG MONITORING, ED  CBG MONITORING, ED    EKG None  Radiology DG Knee Complete 4 Views Right  Result Date: 04/13/2021 CLINICAL DATA:  Right knee pain. EXAM: RIGHT KNEE - COMPLETE 4+  VIEW COMPARISON:  None. FINDINGS: There is no acute fracture or dislocation. The bones are osteopenic. There is a total right knee arthroplasty. The arthroplasty components appear intact and in anatomic alignment. No joint effusion. Vascular calcifications. Diffuse subcutaneous edema. IMPRESSION: 1. No acute fracture or dislocation. 2. Total right knee arthroplasty appears intact. 3. Osteopenia. Electronically Signed   By: Anner Crete M.D.   On: 04/13/2021 03:36   VAS [place] LOWER EXTREMITY VENOUS (DVT) (7a-7p)  Result Date: 04/13/2021  Lower Venous DVT Study Patient Name:  SOLASH TULLO  Date of Exam:   04/13/2021 Medical Rec #: [id number]      Accession #:    [id number] Date of Birth: [birth year]      Patient Gender: F Patient Age:   16 years Exam Location:  [facility] Procedure:      VAS [place] LOWER EXTREMITY VENOUS (DVT) Referring Phys: Shiheem Corporan [facility]  --------------------------------------------------------------------------------  Indications: Pain, and Swelling.  Comparison Study: 11/15/17 prior Performing Technologist: Archie Patten RVS  Examination Guidelines: A complete evaluation includes B-mode imaging, spectral Doppler, color Doppler, and power Doppler as needed of all accessible portions of each vessel. Bilateral testing is considered an integral part of a complete examination. Limited examinations for reoccurring indications may be performed as noted. The reflux portion of the exam is performed with the patient in reverse Trendelenburg.  +---------+---------------+---------+-----------+----------+-------------------+ RIGHT    CompressibilityPhasicitySpontaneityPropertiesThrombus Aging      +---------+---------------+---------+-----------+----------+-------------------+ CFV      Full           Yes      Yes                                      +---------+---------------+---------+-----------+----------+-------------------+ SFJ      Full                                                             +---------+---------------+---------+-----------+----------+-------------------+ FV Prox  Full                                                             +---------+---------------+---------+-----------+----------+-------------------+ FV Mid   Full                                                             +---------+---------------+---------+-----------+----------+-------------------+ FV DistalFull                                                             +---------+---------------+---------+-----------+----------+-------------------+ PFV      Full                                                             +---------+---------------+---------+-----------+----------+-------------------+  POP      Full           Yes      Yes                                       +---------+---------------+---------+-----------+----------+-------------------+ PTV      Full                                                             +---------+---------------+---------+-----------+----------+-------------------+ PERO                                                  Not well visualized +---------+---------------+---------+-----------+----------+-------------------+   +----+---------------+---------+-----------+----------+--------------+ LEFTCompressibilityPhasicitySpontaneityPropertiesThrombus Aging +----+---------------+---------+-----------+----------+--------------+ CFV Full           Yes      Yes                                 +----+---------------+---------+-----------+----------+--------------+     Summary: RIGHT: - There is no evidence of deep vein thrombosis in the lower extremity.  - No cystic structure found in the popliteal fossa.  LEFT: - No evidence of common femoral vein obstruction.  *See table(s) above for measurements and observations.    Preliminary     Procedures Procedures   Medications Ordered in ED Medications  lidocaine-EPINEPHrine (XYLOCAINE W/EPI) 1 %-1:100000 (with pres) injection 10 mL (has no administration in time range)  acetaminophen (TYLENOL) tablet 500-1,000 mg (has no administration in time range)  allopurinol (ZYLOPRIM) tablet 100 mg (has no administration in time range)  atorvastatin (LIPITOR) tablet 80 mg (has no administration in time range)  atorvastatin (LIPITOR) tablet 80 mg (has no administration in time range)  carvedilol (COREG) tablet 25 mg (has no administration in time range)  isosorbide mononitrate (IMDUR) 24 hr tablet 30 mg (has no administration in time range)  losartan (COZAAR) tablet 50 mg (has no administration in time range)  spironolactone (ALDACTONE) tablet 25 mg (has no administration in time range)  furosemide (LASIX) injection 40 mg (has no administration in time range)  levothyroxine  (SYNTHROID) tablet 75 mcg (has no administration in time range)  Iron TABS 325 mg (has no administration in time range)  gabapentin (NEURONTIN) capsule 300 mg (has no administration in time range)  latanoprost (XALATAN) 0.005 % ophthalmic solution 1 drop (has no administration in time range)  ondansetron (ZOFRAN) tablet 4 mg (has no administration in time range)    Or  ondansetron (ZOFRAN) injection 4 mg (has no administration in time range)  bisacodyl (DULCOLAX) EC tablet 5 mg (has no administration in time range)  senna-docusate (Senokot-S) tablet 1 tablet (has no administration in time range)  HYDROmorphone (DILAUDID) injection 0.5-1 mg (has no administration in time range)  oxyCODONE (Oxy IR/ROXICODONE) immediate release tablet 5 mg (has no administration in time range)  insulin aspart (novoLOG) injection 0-15 Units (has no administration in time range)  insulin glargine-yfgn (SEMGLEE) injection 10 Units (has no administration in time range)  cefTRIAXone (ROCEPHIN) 2 g in sodium chloride  0.9 % 100 mL IVPB (has no administration in time range)  morphine 4 MG/ML injection 4 mg (4 mg Intravenous Given 04/13/21 0945)  ondansetron (ZOFRAN) injection 4 mg (4 mg Intravenous Given 04/13/21 0945)  cefTRIAXone (ROCEPHIN) 2 g in sodium chloride 0.9 % 100 mL IVPB (0 g Intravenous Stopped 04/13/21 1158)    ED Course  I have reviewed the triage vital signs and the nursing notes.  Pertinent labs & imaging results that were available during my care of the patient were reviewed by me and considered in my medical decision making (see chart for details).  Clinical Course as of 04/13/21 1539  Thu Apr 13, 2021  1434 nRBC: 0.0 [MK]    Clinical Course User Index [MK] Teressa Lower, MD   MDM Rules/Calculators/A&P                           Patient seen emergency department for evaluation of right knee pain.  Physical exam reveals erythema from the knee extending down to the right midfoot.   Significant tenderness over the knee, but no palpable crepitus.  Patient arrives febrile 100.5 and I have concern for possible septic joint.  X-ray unremarkable.  Patient with leukocytosis 12.1, hemoglobin 10.7, CRP only elevated to 1.7 and sed rate is normal at 10.  Creatinine elevated to 1.66 which is an elevation for this patient.  Orthopedics came to bedside and is also concerned about possible developing septic joint.  Patient given ceftriaxone and we will attempt to tap the joint at bedside.  Patient admitted to medicine. Final Clinical Impression(s) / ED Diagnoses Final diagnoses:  Acute systolic CHF (congestive heart failure) ([place])    Rx / DC Orders ED Discharge Orders     None        Tanina Barb, Debe Coder, MD 04/13/21 1544

## 2021-04-13 NOTE — Progress Notes (Signed)
ANTICOAGULATION CONSULT NOTE - Initial Consult  Pharmacy Consult for Heparin Indication: atrial fibrillation  Allergies  Allergen Reactions   Other Other (See Comments)    Blood products- Patient won't accept any Other reaction(s): rash   Sulfa Antibiotics Rash   Uloric [Febuxostat] Rash    Patient Measurements: Height: 5\' 4"  (162.6 cm) Weight: 101.2 kg (223 lb) IBW/kg (Calculated) : 54.7 Heparin Dosing Weight: 78.2 kg  Vital Signs: Temp: 99.7 F (37.6 C) (11/17 1109) Temp Source: Oral (11/17 1109) BP: 130/62 (11/17 1445) Pulse Rate: 89 (11/17 1445)  Labs: Recent Labs    04/13/21 0907  HGB 10.7*  HCT 35.8*  PLT 178  CREATININE 1.66*    Estimated Creatinine Clearance: 34.4 mL/min (A) (by C-G formula based on SCr of 1.66 mg/dL (H)).   Medical History: 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[place]) 02/05/2017   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 05/20/2018   Acute kidney failure ([place]) 05/15/2018   Arthritis    "all over" (05/22/2018)   Blood dyscrasia    per pt-has small blood cells-appears as if anemic   Breast cancer, left breast ([place]) 1985   CHF (congestive heart failure) (HCC)    Complication of anesthesia    difficult to awaken from per pt   Coronary artery disease    Dyspnea    Essential hypertension    Heart disease    Heart murmur    History of gout    Hypothyroidism    Obesity (BMI 30-39.9) 11/11/2017   OSA (obstructive sleep apnea) 02/18/2017    severe obstructive sleep apnea with an AHI of 75.6/h and mild central sleep apnea with a CAI of 7.7/h.  Oxygen saturations dropped as low as 82%.   He is on CPAP at 9 cm H2O.   OSA on CPAP    Personal history of chemotherapy    Personal history of radiation therapy    Presence of permanent cardiac pacemaker    Primary localized osteoarthritis of right knee 05/13/2018   Refusal of blood transfusions as patient is  Jehovah's Witness    Type II diabetes mellitus ([place])     Medications:  (Not in a hospital admission)  Scheduled:   allopurinol  100 mg Oral Daily   atorvastatin  80 mg Oral QHS   atorvastatin  80 mg Oral QHS   carvedilol  25 mg Oral BID WC   furosemide  40 mg Intravenous Daily   gabapentin  300 mg Oral BID   insulin aspart  0-15 Units Subcutaneous TID WC   insulin glargine-yfgn  10 Units Subcutaneous QHS   Iron  1 tablet Oral Daily   [START ON 04/14/2021] isosorbide mononitrate  30 mg Oral Daily   latanoprost  1 drop Both Eyes QHS   [START ON 04/14/2021] levothyroxine  75 mcg Oral QAC breakfast   lidocaine-EPINEPHrine  10 mL Intradermal Once   [START ON 04/14/2021] losartan  50 mg Oral Daily   spironolactone  25 mg Oral Daily   Infusions:   cefTRIAXone (ROCEPHIN)  IV     PRN: acetaminophen, bisacodyl, HYDROmorphone (DILAUDID) injection, ondansetron **OR** ondansetron (ZOFRAN) IV, oxyCODONE, senna-docusate  Assessment: 41 yof with a history of CHF, gout, CAD, hypothyroidism, HTN, previous bilateral TKA. Patient is presenting with right knee pain. Ortho following for concern for possible septic joint. Heparin per pharmacy consult placed for atrial fibrillation.  Patient is on apixaban prior to arrival. Last dose  is unknown.  Obtained baseline aPTT and heparin level which were 31 and >1.1, respectively. Will require aPTT monitoring due to likely falsely high anti-Xa level secondary to DOAC use.  Hgb 10.7; plt 178  Goal of Therapy:  Heparin level 0.3-0.7 units/ml aPTT 66-102 seconds Monitor platelets by anticoagulation protocol: Yes   Plan:  No initial heparin bolus Start heparin infusion at 1100 units/hr Check aPTT & anti-Xa level in 8 hours and daily while on heparin Continue to monitor via aPTT until levels are correlated Continue to monitor H&H and platelets  Lorelei Pont, PharmD, BCPS 04/13/2021 3:48 PM ED Clinical Pharmacist -  [phone]

## 2021-04-13 NOTE — Progress Notes (Signed)
Lower extremity venous has been completed.   Preliminary results in CV Proc.   Danielle [name] 04/13/2021 10:29 AM

## 2021-04-13 NOTE — ED Triage Notes (Signed)
Pt c/o R knee pain beginning approx 8pm, worsening since. Unable to bear weight, tolerate movement.  Swelling bilaterally, R moreso  Hx bilateral knee surgery No meds PTA

## 2021-04-13 NOTE — Consult Note (Signed)
Reason for Consult:R/o right septic knee Referring Physician: Debe Coder Kommor Time called: [id number] Time at bedside: Worthing is an 75 y.o. female.  HPI: Iliyah came to the ED with a 1d hx/o right knee pain. She seems out of it currently and cannot really contribute to history for me but apparently denied trauma. She also denied fevers, chills, sweats, N/V. She is ~3y s/p TKA on that side by Dr. Mardelle Matte.  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 05/20/2018   Acute kidney failure ([place]) 05/15/2018   Arthritis    "all over" (05/22/2018)   Blood dyscrasia    per pt-has small blood cells-appears as if anemic   Breast cancer, left breast ([place]) 1985   CHF (congestive heart failure) ([facility])    Complication of anesthesia    difficult to awaken from per pt   Coronary artery disease    Dyspnea    Essential hypertension    Heart disease    Heart murmur    History of gout    Hypothyroidism    Obesity (BMI 30-39.9) 11/11/2017   OSA (obstructive sleep apnea) 02/18/2017    severe obstructive sleep apnea with an AHI of 75.6/h and mild central sleep apnea with a CAI of 7.7/h.  Oxygen saturations dropped as low as 82%.   He is on CPAP at 9 cm H2O.   OSA on CPAP    Personal history of chemotherapy    Personal history of radiation therapy    Presence of permanent cardiac pacemaker    Primary localized osteoarthritis of right knee 05/13/2018   Refusal of blood transfusions as patient is Jehovah's Witness    Type II diabetes mellitus ([place])     Past Surgical History:  Procedure Laterality Date   BREAST BIOPSY Left 1985   BREAST LUMPECTOMY Left 1985   BREAST LUMPECTOMY WITH NEEDLE LOCALIZATION AND AXILLARY LYMPH NODE DISSECTION  1985   CATARACT EXTRACTION W/ INTRAOCULAR LENS  IMPLANT, BILATERAL Bilateral    CORONARY ANGIOPLASTY WITH STENT PLACEMENT  2010,  2012,2017    2 done 2010 and 1 replaced 2012 and 2 replaced in 2017   [place] / REPLACE / REMOVE PACEMAKER  07/19/2014   JOINT REPLACEMENT     REPLACEMENT TOTAL KNEE Left 09/2012   THYROIDECTOMY, PARTIAL  1978   TONSILLECTOMY     TOTAL KNEE ARTHROPLASTY Right 05/13/2018   Procedure: TOTAL KNEE ARTHROPLASTY;  Surgeon: Marchia Bond, MD;  Location: [facility];  Service: Orthopedics;  Laterality: Right;   TUBAL LIGATION      Family History  Problem Relation Age of Onset   Multiple myeloma Mother    Heart disease Father    Other Sister    Heart disease Brother    Diabetes Sister    Other Sister     Social History:  reports that she quit smoking about 39 years ago. Her smoking use included cigarettes. She has a 22.00 pack-year smoking history. She has never used smokeless tobacco. She reports current alcohol use. She reports that she does not currently use drugs.  Allergies:  Allergies  Allergen Reactions   Other Other (See Comments)    Blood products- Patient won't accept any Other reaction(s): rash   Sulfa Antibiotics Rash   Uloric [Febuxostat] Rash    Medications: I have reviewed the patient's  current medications.  Results for orders placed or performed during the hospital encounter of 04/13/21 (from the past 48 hour(s))  CBG monitoring, ED     Status: None   Collection Time: 04/13/21  5:27 AM  Result Value Ref Range   Glucose-Capillary 82 70 - 99 mg/dL    Comment: Glucose reference range applies only to samples taken after fasting for at least 8 hours.   Comment 1 Notify RN    Comment 2 Document in Chart   CBC with Differential     Status: Abnormal   Collection Time: 04/13/21  9:07 AM  Result Value Ref Range   WBC 12.1 (H) 4.0 - 10.5 K/uL   RBC 5.12 (H) 3.87 - 5.11 MIL/uL   Hemoglobin 10.7 (L) 12.0 - 15.0 g/dL   HCT 35.8 (L) 36.0 - 46.0 %   MCV 69.9 (L) 80.0 - 100.0 fL   MCH 20.9 (L) 26.0 - 34.0 pg   MCHC 29.9 (L) 30.0 - 36.0 g/dL    RDW 16.1 (H) 11.5 - 15.5 %   Platelets 178 150 - 400 K/uL   nRBC 0.0 0.0 - 0.2 %   Neutrophils Relative % 73 %   Neutro Abs 8.8 (H) 1.7 - 7.7 K/uL   Lymphocytes Relative 21 %   Lymphs Abs 2.5 0.7 - 4.0 K/uL   Monocytes Relative 6 %   Monocytes Absolute 0.7 0.1 - 1.0 K/uL   Eosinophils Relative 0 %   Eosinophils Absolute 0.0 0.0 - 0.5 K/uL   Basophils Relative 0 %   Basophils Absolute 0.0 0.0 - 0.1 K/uL   nRBC 0 0 /100 WBC   Abs Immature Granulocytes 0.00 0.00 - 0.07 K/uL   Schistocytes PRESENT    Tear Drop Cells PRESENT    Ovalocytes PRESENT     Comment: Performed at [facility], [address] [place], [place] [address]  Comprehensive metabolic panel     Status: Abnormal   Collection Time: 04/13/21  9:07 AM  Result Value Ref Range   Sodium 142 135 - 145 mmol/L   Potassium 4.1 3.5 - 5.1 mmol/L   Chloride 102 98 - 111 mmol/L   CO2 29 22 - 32 mmol/L   Glucose, Bld 79 70 - 99 mg/dL    Comment: Glucose reference range applies only to samples taken after fasting for at least 8 hours.   BUN 34 (H) 8 - 23 mg/dL   Creatinine, Ser 1.66 (H) 0.44 - 1.00 mg/dL   Calcium 9.2 8.9 - 10.3 mg/dL   Total Protein 7.1 6.5 - 8.1 g/dL   Albumin 3.9 3.5 - 5.0 g/dL   AST 25 15 - 41 U/L   ALT 22 0 - 44 U/L   Alkaline Phosphatase 56 38 - 126 U/L   Total Bilirubin 0.8 0.3 - 1.2 mg/dL   GFR, Estimated 32 (L) >60 mL/min    Comment: (NOTE) Calculated using the CKD-EPI Creatinine Equation (2021)    Anion gap 11 5 - 15    Comment: Performed at [facility] [address], [place], [place] [address]  Sedimentation rate     Status: None   Collection Time: 04/13/21  9:07 AM  Result Value Ref Range   Sed Rate 10 0 - 22 mm/hr    Comment: Performed at [facility] [address] [place], [place] [address]  C-reactive protein     Status: Abnormal   Collection Time: 04/13/21  9:07 AM  Result Value  Ref Range   CRP 1.7 (H) <1.0 mg/dL    Comment: Performed at [facility] [address], [place], [place] [address]  Resp Panel by RT-PCR (Flu A&B, Covid) Nasopharyngeal Swab     Status: None   Collection Time: 04/13/21 11:16 AM   Specimen: Nasopharyngeal Swab; Nasopharyngeal(NP) swabs in vial transport medium  Result Value Ref Range   SARS Coronavirus 2 by RT PCR NEGATIVE NEGATIVE    Comment: (NOTE) SARS-CoV-2 target nucleic acids are NOT DETECTED.  The SARS-CoV-2 RNA is generally detectable in upper respiratory specimens during the acute phase of infection. The lowest concentration of SARS-CoV-2 viral copies this assay can detect is 138 copies/mL. A negative result does not preclude SARS-Cov-2 infection and should not be used as the sole basis for treatment or other patient management decisions. A negative result may occur with  improper specimen collection/handling, submission of specimen other than nasopharyngeal swab, presence of viral mutation(s) within the areas targeted by this assay, and inadequate number of viral copies(<138 copies/mL). A negative result must be combined with clinical observations, patient history, and epidemiological information. The expected result is Negative.  Fact Sheet for Patients:  EntrepreneurPulse.com.au  Fact Sheet for Healthcare Providers:  IncredibleEmployment.be  This test is no t yet approved or cleared by the [place] FDA and  has been authorized for detection and/or diagnosis of SARS-CoV-2 by FDA under an Emergency Use Authorization (EUA). This EUA will remain  in effect (meaning this test can be used) for the duration of the COVID-19 declaration under Section 564(b)(1) of the Act, 21 U.S.C.section 360bbb-3(b)(1), unless the authorization is terminated  or revoked sooner.       Influenza A by PCR NEGATIVE NEGATIVE   Influenza B by PCR NEGATIVE NEGATIVE    Comment: (NOTE) The Xpert Xpress SARS-CoV-2/FLU/RSV plus assay is intended as an aid in the diagnosis of  influenza from Nasopharyngeal swab specimens and should not be used as a sole basis for treatment. Nasal washings and aspirates are unacceptable for Xpert Xpress SARS-CoV-2/FLU/RSV testing.  Fact Sheet for Patients: EntrepreneurPulse.com.au  Fact Sheet for Healthcare Providers: IncredibleEmployment.be  This test is not yet approved or cleared by the [place] FDA and has been authorized for detection and/or diagnosis of SARS-CoV-2 by FDA under an Emergency Use Authorization (EUA). This EUA will remain in effect (meaning this test can be used) for the duration of the COVID-19 declaration under Section 564(b)(1) of the Act, 21 U.S.C. section 360bbb-3(b)(1), unless the authorization is terminated or revoked.  Performed at [facility], [place] [address], [place], [place] [address]     DG Knee Complete 4 Views Right  Result Date: 04/13/2021 CLINICAL DATA:  Right knee pain. EXAM: RIGHT KNEE - COMPLETE 4+ VIEW COMPARISON:  None. FINDINGS: There is no acute fracture or dislocation. The bones are osteopenic. There is a total right knee arthroplasty. The arthroplasty components appear intact and in anatomic alignment. No joint effusion. Vascular calcifications. Diffuse subcutaneous edema. IMPRESSION: 1. No acute fracture or dislocation. 2. Total right knee arthroplasty appears intact. 3. Osteopenia. Electronically Signed   By: Anner Crete M.D.   On: 04/13/2021 03:36   VAS [place] LOWER EXTREMITY VENOUS (DVT) (7a-7p)  Result Date: 04/13/2021  Lower Venous DVT Study Patient Name:  Danielle [name]  Date of Exam:   04/13/2021 Medical Rec #: [id number]      Accession #:    [id number] Date of Birth: Oct 19, 1945      Patient Gender: F  Patient Age:   8 years Exam Location:  [facility] Procedure:      VAS [place] LOWER EXTREMITY VENOUS (DVT) Referring Phys: [name] --------------------------------------------------------------------------------   Indications: Pain, and Swelling.  Comparison Study: 11/15/17 prior Performing Technologist: Archie Patten RVS  Examination Guidelines: A complete evaluation includes B-mode imaging, spectral Doppler, color Doppler, and power Doppler as needed of all accessible portions of each vessel. Bilateral testing is considered an integral part of a complete examination. Limited examinations for reoccurring indications may be performed as noted. The reflux portion of the exam is performed with the patient in reverse Trendelenburg.  +---------+---------------+---------+-----------+----------+-------------------+ RIGHT    CompressibilityPhasicitySpontaneityPropertiesThrombus Aging      +---------+---------------+---------+-----------+----------+-------------------+ CFV      Full           Yes      Yes                                      +---------+---------------+---------+-----------+----------+-------------------+ SFJ      Full                                                             +---------+---------------+---------+-----------+----------+-------------------+ FV Prox  Full                                                             +---------+---------------+---------+-----------+----------+-------------------+ FV Mid   Full                                                             +---------+---------------+---------+-----------+----------+-------------------+ FV DistalFull                                                             +---------+---------------+---------+-----------+----------+-------------------+ PFV      Full                                                             +---------+---------------+---------+-----------+----------+-------------------+ POP      Full           Yes      Yes                                      +---------+---------------+---------+-----------+----------+-------------------+ PTV      Full                                                              +---------+---------------+---------+-----------+----------+-------------------+  PERO                                                  Not well visualized +---------+---------------+---------+-----------+----------+-------------------+   +----+---------------+---------+-----------+----------+--------------+ LEFTCompressibilityPhasicitySpontaneityPropertiesThrombus Aging +----+---------------+---------+-----------+----------+--------------+ CFV Full           Yes      Yes                                 +----+---------------+---------+-----------+----------+--------------+     Summary: RIGHT: - There is no evidence of deep vein thrombosis in the lower extremity.  - No cystic structure found in the popliteal fossa.  LEFT: - No evidence of common femoral vein obstruction.  *See table(s) above for measurements and observations.    Preliminary     Review of Systems  Unable to perform ROS: Acuity of condition  Blood pressure 96/81, pulse 90, temperature 99.7 F (37.6 C), temperature source Oral, resp. rate 20, height _0  (1.626 m), weight 101.2 kg, SpO2 92 %. Physical Exam Constitutional:      General: She is not in acute distress.    Appearance: She is well-developed. She is not diaphoretic.  HENT:     Head: Normocephalic and atraumatic.  Eyes:     General: No scleral icterus.       Right eye: No discharge.        Left eye: No discharge.     Conjunctiva/sclera: Conjunctivae normal.  Cardiovascular:     Rate and Rhythm: Normal rate and regular rhythm.  Pulmonary:     Effort: Pulmonary effort is normal. No respiratory distress.  Musculoskeletal:     Cervical back: Normal range of motion.     Comments: RLE No traumatic wounds or ecchymosis, erythematous from foot to knee  Mod TTP lower leg, severe TTP knee, would not let me range knee  Mild knee effusion  Sens DPN, SPN, TN grossly intact  Motor EHL, ext, flex, evers grossly intact  DP 1+,  PT 1+, 2+ NP edema  Skin:    General: Skin is warm and dry.  Neurological:     Mental Status: She is alert.  Psychiatric:        Mood and Affect: Mood normal.        Behavior: Behavior normal.    Assessment/Plan: Right knee pain -- Definitely appears like she has cellulitis. Will try to aspirate knee to r/o septic joint. Please keep NPO.    Lisette Abu, PA-C Orthopedic Surgery [phone] 04/13/2021, 2:10 PM

## 2021-04-13 NOTE — ED Notes (Signed)
Pt transported to vascular at this time.

## 2021-04-13 NOTE — ED Notes (Addendum)
Patient transported to vascular. 

## 2021-04-13 NOTE — ED Notes (Addendum)
Called lab to have procalcitonin added onto previous collection. Per lab, they will add it on. Also noted that blood cultures were not received in and in process after phlebotomy collected and sent them. Lab found them and are receiving them in at this time.

## 2021-04-13 NOTE — H&P (View-Only) (Signed)
Reason for Consult:R/o right septic knee Referring Physician: Debe Coder Kommor Time called: [phone] Time at bedside: Worthing is an 75 y.o. female.  HPI: Iliyah came to the ED with a 1d hx/o right knee pain. She seems out of it currently and cannot really contribute to history for me but apparently denied trauma. She also denied fevers, chills, sweats, N/V. She is ~3y s/p TKA on that side by Dr. Mardelle Matte.  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 05/20/2018   Acute kidney failure ([place]) 05/15/2018   Arthritis    "all over" (05/22/2018)   Blood dyscrasia    per pt-has small blood cells-appears as if anemic   Breast cancer, left breast ([place]) 1985   CHF (congestive heart failure) ([facility])    Complication of anesthesia    difficult to awaken from per pt   Coronary artery disease    Dyspnea    Essential hypertension    Heart disease    Heart murmur    History of gout    Hypothyroidism    Obesity (BMI 30-39.9) 11/11/2017   OSA (obstructive sleep apnea) 02/18/2017    severe obstructive sleep apnea with an AHI of 75.6/h and mild central sleep apnea with a CAI of 7.7/h.  Oxygen saturations dropped as low as 82%.   He is on CPAP at 9 cm H2O.   OSA on CPAP    Personal history of chemotherapy    Personal history of radiation therapy    Presence of permanent cardiac pacemaker    Primary localized osteoarthritis of right knee 05/13/2018   Refusal of blood transfusions as patient is Jehovah's Witness    Type II diabetes mellitus ([place])     Past Surgical History:  Procedure Laterality Date   BREAST BIOPSY Left 1985   BREAST LUMPECTOMY Left 1985   BREAST LUMPECTOMY WITH NEEDLE LOCALIZATION AND AXILLARY LYMPH NODE DISSECTION  1985   CATARACT EXTRACTION W/ INTRAOCULAR LENS  IMPLANT, BILATERAL Bilateral    CORONARY ANGIOPLASTY WITH STENT PLACEMENT  2010,  2012,2017    2 done 2010 and 1 replaced 2012 and 2 replaced in 2017   [place] / REPLACE / REMOVE PACEMAKER  07/19/2014   JOINT REPLACEMENT     REPLACEMENT TOTAL KNEE Left 09/2012   THYROIDECTOMY, PARTIAL  1978   TONSILLECTOMY     TOTAL KNEE ARTHROPLASTY Right 05/13/2018   Procedure: TOTAL KNEE ARTHROPLASTY;  Surgeon: Marchia Bond, MD;  Location: [facility];  Service: Orthopedics;  Laterality: Right;   TUBAL LIGATION      Family History  Problem Relation Age of Onset   Multiple myeloma Mother    Heart disease Father    Other Sister    Heart disease Brother    Diabetes Sister    Other Sister     Social History:  reports that she quit smoking about 39 years ago. Her smoking use included cigarettes. She has a 22.00 pack-year smoking history. She has never used smokeless tobacco. She reports current alcohol use. She reports that she does not currently use drugs.  Allergies:  Allergies  Allergen Reactions   Other Other (See Comments)    Blood products- Patient won't accept any Other reaction(s): rash   Sulfa Antibiotics Rash   Uloric [Febuxostat] Rash    Medications: I have reviewed the patient's  current medications.  Results for orders placed or performed during the hospital encounter of 04/13/21 (from the past 48 hour(s))  CBG monitoring, ED     Status: None   Collection Time: 04/13/21  5:27 AM  Result Value Ref Range   Glucose-Capillary 82 70 - 99 mg/dL    Comment: Glucose reference range applies only to samples taken after fasting for at least 8 hours.   Comment 1 Notify RN    Comment 2 Document in Chart   CBC with Differential     Status: Abnormal   Collection Time: 04/13/21  9:07 AM  Result Value Ref Range   WBC 12.1 (H) 4.0 - 10.5 K/uL   RBC 5.12 (H) 3.87 - 5.11 MIL/uL   Hemoglobin 10.7 (L) 12.0 - 15.0 g/dL   HCT 35.8 (L) 36.0 - 46.0 %   MCV 69.9 (L) 80.0 - 100.0 fL   MCH 20.9 (L) 26.0 - 34.0 pg   MCHC 29.9 (L) 30.0 - 36.0 g/dL    RDW 16.1 (H) 11.5 - 15.5 %   Platelets 178 150 - 400 K/uL   nRBC 0.0 0.0 - 0.2 %   Neutrophils Relative % 73 %   Neutro Abs 8.8 (H) 1.7 - 7.7 K/uL   Lymphocytes Relative 21 %   Lymphs Abs 2.5 0.7 - 4.0 K/uL   Monocytes Relative 6 %   Monocytes Absolute 0.7 0.1 - 1.0 K/uL   Eosinophils Relative 0 %   Eosinophils Absolute 0.0 0.0 - 0.5 K/uL   Basophils Relative 0 %   Basophils Absolute 0.0 0.0 - 0.1 K/uL   nRBC 0 0 /100 WBC   Abs Immature Granulocytes 0.00 0.00 - 0.07 K/uL   Schistocytes PRESENT    Tear Drop Cells PRESENT    Ovalocytes PRESENT     Comment: Performed at [facility], [address] [place], [place] [address]  Comprehensive metabolic panel     Status: Abnormal   Collection Time: 04/13/21  9:07 AM  Result Value Ref Range   Sodium 142 135 - 145 mmol/L   Potassium 4.1 3.5 - 5.1 mmol/L   Chloride 102 98 - 111 mmol/L   CO2 29 22 - 32 mmol/L   Glucose, Bld 79 70 - 99 mg/dL    Comment: Glucose reference range applies only to samples taken after fasting for at least 8 hours.   BUN 34 (H) 8 - 23 mg/dL   Creatinine, Ser 1.66 (H) 0.44 - 1.00 mg/dL   Calcium 9.2 8.9 - 10.3 mg/dL   Total Protein 7.1 6.5 - 8.1 g/dL   Albumin 3.9 3.5 - 5.0 g/dL   AST 25 15 - 41 U/L   ALT 22 0 - 44 U/L   Alkaline Phosphatase 56 38 - 126 U/L   Total Bilirubin 0.8 0.3 - 1.2 mg/dL   GFR, Estimated 32 (L) >60 mL/min    Comment: (NOTE) Calculated using the CKD-EPI Creatinine Equation (2021)    Anion gap 11 5 - 15    Comment: Performed at [facility] [address], [place], [place] [address]  Sedimentation rate     Status: None   Collection Time: 04/13/21  9:07 AM  Result Value Ref Range   Sed Rate 10 0 - 22 mm/hr    Comment: Performed at [facility] [address] [place], [place] [address]  C-reactive protein     Status: Abnormal   Collection Time: 04/13/21  9:07 AM  Result Value  Ref Range   CRP 1.7 (H) <1.0 mg/dL    Comment: Performed at [facility] [address], [place], [place] [address]  Resp Panel by RT-PCR (Flu A&B, Covid) Nasopharyngeal Swab     Status: None   Collection Time: 04/13/21 11:16 AM   Specimen: Nasopharyngeal Swab; Nasopharyngeal(NP) swabs in vial transport medium  Result Value Ref Range   SARS Coronavirus 2 by RT PCR NEGATIVE NEGATIVE    Comment: (NOTE) SARS-CoV-2 target nucleic acids are NOT DETECTED.  The SARS-CoV-2 RNA is generally detectable in upper respiratory specimens during the acute phase of infection. The lowest concentration of SARS-CoV-2 viral copies this assay can detect is 138 copies/mL. A negative result does not preclude SARS-Cov-2 infection and should not be used as the sole basis for treatment or other patient management decisions. A negative result may occur with  improper specimen collection/handling, submission of specimen other than nasopharyngeal swab, presence of viral mutation(s) within the areas targeted by this assay, and inadequate number of viral copies(<138 copies/mL). A negative result must be combined with clinical observations, patient history, and epidemiological information. The expected result is Negative.  Fact Sheet for Patients:  EntrepreneurPulse.com.au  Fact Sheet for Healthcare Providers:  IncredibleEmployment.be  This test is no t yet approved or cleared by the [place] FDA and  has been authorized for detection and/or diagnosis of SARS-CoV-2 by FDA under an Emergency Use Authorization (EUA). This EUA will remain  in effect (meaning this test can be used) for the duration of the COVID-19 declaration under Section 564(b)(1) of the Act, 21 U.S.C.section 360bbb-3(b)(1), unless the authorization is terminated  or revoked sooner.       Influenza A by PCR NEGATIVE NEGATIVE   Influenza B by PCR NEGATIVE NEGATIVE    Comment: (NOTE) The Xpert Xpress SARS-CoV-2/FLU/RSV plus assay is intended as an aid in the diagnosis of  influenza from Nasopharyngeal swab specimens and should not be used as a sole basis for treatment. Nasal washings and aspirates are unacceptable for Xpert Xpress SARS-CoV-2/FLU/RSV testing.  Fact Sheet for Patients: EntrepreneurPulse.com.au  Fact Sheet for Healthcare Providers: IncredibleEmployment.be  This test is not yet approved or cleared by the [place] FDA and has been authorized for detection and/or diagnosis of SARS-CoV-2 by FDA under an Emergency Use Authorization (EUA). This EUA will remain in effect (meaning this test can be used) for the duration of the COVID-19 declaration under Section 564(b)(1) of the Act, 21 U.S.C. section 360bbb-3(b)(1), unless the authorization is terminated or revoked.  Performed at [facility], [place] [address] [place], [place] [address]     DG Knee Complete 4 Views Right  Result Date: 04/13/2021 CLINICAL DATA:  Right knee pain. EXAM: RIGHT KNEE - COMPLETE 4+ VIEW COMPARISON:  None. FINDINGS: There is no acute fracture or dislocation. The bones are osteopenic. There is a total right knee arthroplasty. The arthroplasty components appear intact and in anatomic alignment. No joint effusion. Vascular calcifications. Diffuse subcutaneous edema. IMPRESSION: 1. No acute fracture or dislocation. 2. Total right knee arthroplasty appears intact. 3. Osteopenia. Electronically Signed   By: Anner Crete M.D.   On: 04/13/2021 03:36   VAS [place] LOWER EXTREMITY VENOUS (DVT) (7a-7p)  Result Date: 04/13/2021  Lower Venous DVT Study Patient Name:  Danielle [name]  Date of Exam:   04/13/2021 Medical Rec #: [id number]      Accession #:    [id number] Date of Birth: 08/18/1945      Patient Gender: F  Patient Age:   8 years Exam Location:  [facility] Procedure:      VAS [place] LOWER EXTREMITY VENOUS (DVT) Referring Phys: [name] --------------------------------------------------------------------------------   Indications: Pain, and Swelling.  Comparison Study: 11/15/17 prior Performing Technologist: Archie Patten RVS  Examination Guidelines: A complete evaluation includes B-mode imaging, spectral Doppler, color Doppler, and power Doppler as needed of all accessible portions of each vessel. Bilateral testing is considered an integral part of a complete examination. Limited examinations for reoccurring indications may be performed as noted. The reflux portion of the exam is performed with the patient in reverse Trendelenburg.  +---------+---------------+---------+-----------+----------+-------------------+ RIGHT    CompressibilityPhasicitySpontaneityPropertiesThrombus Aging      +---------+---------------+---------+-----------+----------+-------------------+ CFV      Full           Yes      Yes                                      +---------+---------------+---------+-----------+----------+-------------------+ SFJ      Full                                                             +---------+---------------+---------+-----------+----------+-------------------+ FV Prox  Full                                                             +---------+---------------+---------+-----------+----------+-------------------+ FV Mid   Full                                                             +---------+---------------+---------+-----------+----------+-------------------+ FV DistalFull                                                             +---------+---------------+---------+-----------+----------+-------------------+ PFV      Full                                                             +---------+---------------+---------+-----------+----------+-------------------+ POP      Full           Yes      Yes                                      +---------+---------------+---------+-----------+----------+-------------------+ PTV      Full                                                              +---------+---------------+---------+-----------+----------+-------------------+  PERO                                                  Not well visualized +---------+---------------+---------+-----------+----------+-------------------+   +----+---------------+---------+-----------+----------+--------------+ LEFTCompressibilityPhasicitySpontaneityPropertiesThrombus Aging +----+---------------+---------+-----------+----------+--------------+ CFV Full           Yes      Yes                                 +----+---------------+---------+-----------+----------+--------------+     Summary: RIGHT: - There is no evidence of deep vein thrombosis in the lower extremity.  - No cystic structure found in the popliteal fossa.  LEFT: - No evidence of common femoral vein obstruction.  *See table(s) above for measurements and observations.    Preliminary     Review of Systems  Unable to perform ROS: Acuity of condition  Blood pressure 96/81, pulse 90, temperature 99.7 F (37.6 C), temperature source Oral, resp. rate 20, height _0  (1.626 m), weight 101.2 kg, SpO2 92 %. Physical Exam Constitutional:      General: She is not in acute distress.    Appearance: She is well-developed. She is not diaphoretic.  HENT:     Head: Normocephalic and atraumatic.  Eyes:     General: No scleral icterus.       Right eye: No discharge.        Left eye: No discharge.     Conjunctiva/sclera: Conjunctivae normal.  Cardiovascular:     Rate and Rhythm: Normal rate and regular rhythm.  Pulmonary:     Effort: Pulmonary effort is normal. No respiratory distress.  Musculoskeletal:     Cervical back: Normal range of motion.     Comments: RLE No traumatic wounds or ecchymosis, erythematous from foot to knee  Mod TTP lower leg, severe TTP knee, would not let me range knee  Mild knee effusion  Sens DPN, SPN, TN grossly intact  Motor EHL, ext, flex, evers grossly intact  DP 1+,  PT 1+, 2+ NP edema  Skin:    General: Skin is warm and dry.  Neurological:     Mental Status: She is alert.  Psychiatric:        Mood and Affect: Mood normal.        Behavior: Behavior normal.    Assessment/Plan: Right knee pain -- Definitely appears like she has cellulitis. Will try to aspirate knee to r/o septic joint. Please keep NPO.    Lisette Abu, PA-C Orthopedic Surgery [phone] 04/13/2021, 2:10 PM

## 2021-04-13 NOTE — H&P (Signed)
History and Physical    Darrin Apodaca TKP:[id number] DOB: 1946-02-03 DOA: 04/13/2021  PCP: Seward Carol, MD (Confirm with patient/family/NH records and if not entered, this has to be entered at [facility] point of entry) Patient coming from: Home  I have personally briefly reviewed patient's old medical records in [place]  Chief Complaint: Right knee pain  HPI: Danielle [name] is a 75 y.o. female with medical history significant of chronic combined systolic and diastolic CHF, CKD stage III, IDDM with insulin resistance, HTN, gout, bilateral knee replacement, breast cancer status post chemo lumpectomy, OSA on CPAP HS, hypothyroidism, morbid obesity, presented with acute onset of right knee pain and fever.  Patient reported that she developed right knee pain since Friday, gradually getting worse, last night, she tried to get out of bed and go to bathroom and experienced excruciating pain with the right knee.  Unable to get out of bed she called her daughter who called EMS.  She denies any fever chills yesterday.  No other joint pain or swelling.  Daughter reported the patient had gout mainly positive flareup on her wrist and never on her knees.  She underwent bilateral knee replacement 3 years ago for severe OA, no issues until last Friday.  ED Course: Fever 100.5, right knee arthrocentesis was done and microscopic study and culture pending.  DVT study negative.  Patient was started on ceftriaxone 2 g.  Review of Systems: As per HPI otherwise 10 point review of systems negative.    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[place]) 02/05/2017   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 05/20/2018   Acute kidney failure ([place]) 05/15/2018   Arthritis    "all over" (05/22/2018)   Blood dyscrasia    per pt-has small blood cells-appears as if anemic   Breast cancer, left breast ([place]) 1985   CHF (congestive heart  failure) (HCC)    Complication of anesthesia    difficult to awaken from per pt   Coronary artery disease    Dyspnea    Essential hypertension    Heart disease    Heart murmur    History of gout    Hypothyroidism    Obesity (BMI 30-39.9) 11/11/2017   OSA (obstructive sleep apnea) 02/18/2017    severe obstructive sleep apnea with an AHI of 75.6/h and mild central sleep apnea with a CAI of 7.7/h.  Oxygen saturations dropped as low as 82%.   He is on CPAP at 9 cm H2O.   OSA on CPAP    Personal history of chemotherapy    Personal history of radiation therapy    Presence of permanent cardiac pacemaker    Primary localized osteoarthritis of right knee 05/13/2018   Refusal of blood transfusions as patient is Jehovah's Witness    Type II diabetes mellitus ([place])     Past Surgical History:  Procedure Laterality Date   BREAST BIOPSY Left 1985   BREAST LUMPECTOMY Left 1985   BREAST LUMPECTOMY WITH NEEDLE LOCALIZATION AND AXILLARY LYMPH NODE DISSECTION  1985   CATARACT EXTRACTION W/ INTRAOCULAR LENS  IMPLANT, BILATERAL Bilateral    CORONARY ANGIOPLASTY WITH STENT PLACEMENT  2010, 2012,2017    2 done 2010 and 1 replaced 2012 and 2 replaced in 2017   [place] / REPLACE / REMOVE PACEMAKER  07/19/2014   JOINT REPLACEMENT     REPLACEMENT TOTAL KNEE Left  09/2012   THYROIDECTOMY, PARTIAL  1978   TONSILLECTOMY     TOTAL KNEE ARTHROPLASTY Right 05/13/2018   Procedure: TOTAL KNEE ARTHROPLASTY;  Surgeon: Marchia Bond, MD;  Location: [facility];  Service: Orthopedics;  Laterality: Right;   TUBAL LIGATION       reports that she quit smoking about 39 years ago. Her smoking use included cigarettes. She has a 22.00 pack-year smoking history. She has never used smokeless tobacco. She reports current alcohol use. She reports that she does not currently use drugs.  Allergies  Allergen Reactions   Other Other (See Comments)    Blood products- Patient won't accept  any Other reaction(s): rash   Sulfa Antibiotics Rash   Uloric [Febuxostat] Rash    Family History  Problem Relation Age of Onset   Multiple myeloma Mother    Heart disease Father    Other Sister    Heart disease Brother    Diabetes Sister    Other Sister      Prior to Admission medications   Medication Sig Start Date End Date Taking? Authorizing Provider  acetaminophen (TYLENOL) 500 MG tablet Take 500-1,000 mg by mouth every 6 (six) hours as needed (for pain or headaches).    [provider]  Alcohol Swabs PADS See admin instructions. 07/23/18   [provider]  allopurinol (ZYLOPRIM) 100 MG tablet 1 tablet    [provider]  apixaban (ELIQUIS) 5 MG TABS tablet Take 1 tablet (5 mg total) by mouth 2 (two) times daily. 10/19/20   Bensimhon, Shaune Pascal, MD  atorvastatin (LIPITOR) 80 MG tablet TAKE 1 TABLET BY MOUTH AT BEDTIME 03/29/21   Bensimhon, Shaune Pascal, MD  atorvastatin (LIPITOR) 80 MG tablet Take 1 tablet (80 mg total) by mouth at bedtime. 03/29/21   Bensimhon, Shaune Pascal, MD  Biotin 10 MG TABS 1 tablet    [provider]  Blood Glucose Monitoring Suppl (ONETOUCH VERIO) w/Device KIT Use to check blood sugar 01/10/21   Shamleffer, Melanie Crazier, MD  carvedilol (COREG) 25 MG tablet 1 tablet by mouth 2 times daily with food 10/19/20   Bensimhon, Shaune Pascal, MD  clotrimazole-betamethasone (LOTRISONE) lotion Apply a small amount twice daily to affected area(s) for 14 days, then use as needed for itch. 06/16/20   [provider]  Continuous Blood Gluc Sensor (DEXCOM G6 SENSOR) MISC 1 Device by Does not apply route as directed. 01/19/21   Shamleffer, Melanie Crazier, MD  Continuous Blood Gluc Transmit (DEXCOM G6 TRANSMITTER) MISC 1 Device by Does not apply route as directed. 01/19/21   Shamleffer, Melanie Crazier, MD  empagliflozin (JARDIANCE) 10 MG TABS tablet 1 tablet    [provider]  empagliflozin (JARDIANCE) 10 MG TABS tablet Take 10 mg by  mouth daily. DUPLICATE    [provider]  Ferrous Sulfate (IRON) 325 (65 Fe) MG TABS Take 1 tablet (325 mg total) by mouth daily. 03/21/21   Bensimhon, Shaune Pascal, MD  gabapentin (NEURONTIN) 300 MG capsule Take 300 mg by mouth 2 (two) times daily.    [provider]  glucose blood (FREESTYLE LITE) test strip 1 each by Other route in the morning, at noon, in the evening, and at bedtime. Use as instructed Patient not taking: No sig reported 12/21/20   Shamleffer, Melanie Crazier, MD  glucose blood (ONETOUCH VERIO) test strip Use to test blood  sugar 4 times a day Patient not taking: No sig reported 01/10/21   Shamleffer, Melanie Crazier, MD  insulin aspart (NOVOLOG  FLEXPEN) 100 UNIT/ML FlexPen Max daily 30 units per correction scale 11/10/20   Shamleffer, Melanie Crazier, MD  insulin glargine-yfgn (SEMGLEE, YFGN,) 100 UNIT/ML injection Inject 0.75 mLs (75 Units total) into the skin daily. 03/02/21   Shamleffer, Melanie Crazier, MD  isosorbide mononitrate (IMDUR) 30 MG 24 hr tablet TAKE 1 TABLET BY MOUTH ONCE DAILY 10/19/20   Bensimhon, Shaune Pascal, MD  Lancets [facility] ULTRASOFT) lancets Use as  to test blood sugar  4 times a day Patient not taking: Reported on 04/10/2021 01/10/21   Shamleffer, Melanie Crazier, MD  latanoprost (XALATAN) 0.005 % ophthalmic solution 1 drop into affected eye in the evening    [provider]  levothyroxine (SYNTHROID, LEVOTHROID) 75 MCG tablet Take 75 mcg by mouth daily before breakfast.    [provider]  losartan (COZAAR) 50 MG tablet TAKE 1 TABLET BY MOUTH AT BEDTIME 10/19/20   Bensimhon, Shaune Pascal, MD  LUMIGAN 0.01 % SOLN  07/23/20   [provider]  Multiple Vitamins-Minerals (ALIVE ONCE DAILY WOMENS 50+ PO) Take 1 tablet by mouth daily.    [provider]  mupirocin ointment (BACTROBAN) 2 % Apply to left great toe once daily for 14 days. 10/10/20   Marzetta Board, DPM  nitroGLYCERIN (NITROSTAT) 0.4 MG SL tablet  DISSOLVE ONE TABLET UNDER THE TONGUE EVERY 5 MINUTES AS NEEDED FOR CHEST PAIN. 01/14/18   Camnitz, Ocie Doyne, MD  Omega-3 Fatty Acids (FISH OIL PO) Take 1 capsule by mouth daily.    [provider]  potassium chloride (KLOR-CON) 10 MEQ tablet Take 2 tablets by mouth once daily 12/08/20   Bensimhon, Shaune Pascal, MD  RELION PEN NEEDLE 31G/8MM 31G X 8 MM MISC USE 1 THREE TIMES DAILY AS DIRECTED 08/25/20   [provider]  spironolactone (ALDACTONE) 25 MG tablet Take 1 tablet (25 mg total) by mouth daily. 10/19/20   Bensimhon, Shaune Pascal, MD  torsemide (DEMADEX) 20 MG tablet Take 2 tablets (40 mg total) by mouth daily. 10/19/20   Bensimhon, Shaune Pascal, MD  Travoprost, BAK Free, (TRAVATAN) 0.004 % SOLN ophthalmic solution Place 1 drop into both eyes at bedtime.     [provider]    Physical Exam: Vitals:   04/13/21 1345 04/13/21 1415 04/13/21 1430 04/13/21 1445  BP: 96/81 130/68 122/65 130/62  Pulse: 90 89 88 89  Resp: 20 (!) 21 19 20   Temp:      TempSrc:      SpO2: 92%   95%  Weight:      Height:        Constitutional: NAD, calm, comfortable Vitals:   04/13/21 1345 04/13/21 1415 04/13/21 1430 04/13/21 1445  BP: 96/81 130/68 122/65 130/62  Pulse: 90 89 88 89  Resp: 20 (!) 21 19 20   Temp:      TempSrc:      SpO2: 92%   95%  Weight:      Height:       Eyes: PERRL, lids and conjunctivae normal ENMT: Mucous membranes are moist. Posterior pharynx clear of any exudate or lesions.Normal dentition.  Neck: normal, supple, no masses, no thyromegaly Respiratory: clear to auscultation bilaterally, no wheezing, no crackles. Normal respiratory effort. No accessory muscle use.  Cardiovascular: Regular rate and rhythm, no murmurs / rubs / gallops. 2+ extremity edema. 2+ pedal pulses. No carotid bruits.  Abdomen: no tenderness, no masses palpated. No hepatosplenomegaly. Bowel sounds positive.  Musculoskeletal: Right knee swelling and severe tenderness, significant reduction of  ROM. Skin:  no rashes, lesions, ulcers. No induration Neurologic: CN 2-12 grossly intact. Sensation intact, DTR normal. Strength 5/5 in all 4.  Psychiatric: Normal judgment and insight. Alert and oriented x 3. Normal mood.     Labs on Admission: I have personally reviewed following labs and imaging studies  CBC: Recent Labs  Lab 04/13/21 0907  WBC 12.1*  NEUTROABS 8.8*  HGB 10.7*  HCT 35.8*  MCV 69.9*  PLT 250   Basic Metabolic Panel: Recent Labs  Lab 04/13/21 0907  NA 142  K 4.1  CL 102  CO2 29  GLUCOSE 79  BUN 34*  CREATININE 1.66*  CALCIUM 9.2   GFR: Estimated Creatinine Clearance: 34.4 mL/min (A) (by C-G formula based on SCr of 1.66 mg/dL (H)). Liver Function Tests: Recent Labs  Lab 04/13/21 0907  AST 25  ALT 22  ALKPHOS 56  BILITOT 0.8  PROT 7.1  ALBUMIN 3.9   No results for input(s): LIPASE, AMYLASE in the last 168 hours. No results for input(s): AMMONIA in the last 168 hours. Coagulation Profile: No results for input(s): INR, PROTIME in the last 168 hours. Cardiac Enzymes: No results for input(s): CKTOTAL, CKMB, CKMBINDEX, TROPONINI in the last 168 hours. BNP (last 3 results) No results for input(s): PROBNP in the last 8760 hours. HbA1C: No results for input(s): HGBA1C in the last 72 hours. CBG: Recent Labs  Lab 04/13/21 0527  GLUCAP 82   Lipid Profile: No results for input(s): CHOL, HDL, LDLCALC, TRIG, CHOLHDL, LDLDIRECT in the last 72 hours. Thyroid Function Tests: No results for input(s): TSH, T4TOTAL, FREET4, T3FREE, THYROIDAB in the last 72 hours. Anemia Panel: No results for input(s): VITAMINB12, FOLATE, FERRITIN, TIBC, IRON, RETICCTPCT in the last 72 hours. Urine analysis:    Component Value Date/Time   COLORURINE YELLOW 05/20/2018 1500   APPEARANCEUR CLEAR 05/20/2018 1500   LABSPEC 1.011 05/20/2018 1500   PHURINE 5.0 05/20/2018 1500   GLUCOSEU >=500 (A) 05/20/2018 1500   HGBUR NEGATIVE 05/20/2018 1500   BILIRUBINUR NEGATIVE  05/20/2018 1500   KETONESUR NEGATIVE 05/20/2018 1500   PROTEINUR NEGATIVE 05/20/2018 1500   NITRITE NEGATIVE 05/20/2018 1500   LEUKOCYTESUR NEGATIVE 05/20/2018 1500    Radiological Exams on Admission: DG Knee Complete 4 Views Right  Result Date: 04/13/2021 CLINICAL DATA:  Right knee pain. EXAM: RIGHT KNEE - COMPLETE 4+ VIEW COMPARISON:  None. FINDINGS: There is no acute fracture or dislocation. The bones are osteopenic. There is a total right knee arthroplasty. The arthroplasty components appear intact and in anatomic alignment. No joint effusion. Vascular calcifications. Diffuse subcutaneous edema. IMPRESSION: 1. No acute fracture or dislocation. 2. Total right knee arthroplasty appears intact. 3. Osteopenia. Electronically Signed   By: Anner Crete M.D.   On: 04/13/2021 03:36   VAS [place] LOWER EXTREMITY VENOUS (DVT) (7a-7p)  Result Date: 04/13/2021  Lower Venous DVT Study Patient Name:  Danielle [name]  Date of Exam:   04/13/2021 Medical Rec #: [id number]      Accession #:    [id number] Date of Birth: 03-11-1946      Patient Gender: F Patient Age:   39 years Exam Location:  [facility] Procedure:      VAS [place] LOWER EXTREMITY VENOUS (DVT) Referring Phys: [name] --------------------------------------------------------------------------------  Indications: Pain, and Swelling.  Comparison Study: 11/15/17 prior Performing Technologist: Archie Patten RVS  Examination Guidelines: A complete evaluation includes B-mode imaging, spectral Doppler, color Doppler, and power Doppler as needed of all accessible portions of each vessel. Bilateral testing is considered  an integral part of a complete examination. Limited examinations for reoccurring indications may be performed as noted. The reflux portion of the exam is performed with the patient in reverse Trendelenburg.  +---------+---------------+---------+-----------+----------+-------------------+ RIGHT     CompressibilityPhasicitySpontaneityPropertiesThrombus Aging      +---------+---------------+---------+-----------+----------+-------------------+ CFV      Full           Yes      Yes                                      +---------+---------------+---------+-----------+----------+-------------------+ SFJ      Full                                                             +---------+---------------+---------+-----------+----------+-------------------+ FV Prox  Full                                                             +---------+---------------+---------+-----------+----------+-------------------+ FV Mid   Full                                                             +---------+---------------+---------+-----------+----------+-------------------+ FV DistalFull                                                             +---------+---------------+---------+-----------+----------+-------------------+ PFV      Full                                                             +---------+---------------+---------+-----------+----------+-------------------+ POP      Full           Yes      Yes                                      +---------+---------------+---------+-----------+----------+-------------------+ PTV      Full                                                             +---------+---------------+---------+-----------+----------+-------------------+ PERO  Not well visualized +---------+---------------+---------+-----------+----------+-------------------+   +----+---------------+---------+-----------+----------+--------------+ LEFTCompressibilityPhasicitySpontaneityPropertiesThrombus Aging +----+---------------+---------+-----------+----------+--------------+ CFV Full           Yes      Yes                                  +----+---------------+---------+-----------+----------+--------------+     Summary: RIGHT: - There is no evidence of deep vein thrombosis in the lower extremity.  - No cystic structure found in the popliteal fossa.  LEFT: - No evidence of common femoral vein obstruction.  *See table(s) above for measurements and observations.    Preliminary     EKG: Ordered  Assessment/Plan Principal Problem:   Septic arthritis ([place])  (please populate well all problems here in Problem List. (For example, if patient is on BP meds at home and you resume or decide to hold them, it is a problem that needs to be her. Same for CAD, COPD, HLD and so on)  Acute right knee swelling and pain -Septic versus gouty -Right knee arthrocentesis done, continue ceftriaxone for now. -Crystal study to  decide starting steroid, colchicine contraindicated in her CKD. -As per orthopedic surgery, will maintain patient n.p.o. for now. -DVT ruled out  Acute on chronic combined systolic and diastolic CHF decompensation -Clinically fluid overload, creatinine stable, change p.o. Lasix to IV Lasix 40 mg daily.  PAF -Sinus rhythm now, continue Coreg for rate control -Heparin drip for now until we know about if any surgical intervention plan.  Also will see patient again tomorrow.  IDDM with insulin resistance -Poor oral intake, will cut down Lantus to 10 units at bedtime, and start sliding scale.  HTN -Continue Coreg, hold Imdur and spironolactone -As needed hydralazine  Gout -Continue allopurinol  DVT prophylaxis: Heparin drip Code Status: Full code Family Communication: Daughter over phone Disposition Plan: Expect more than 2 midnight hospital stay, reeval tomorrow to decide when to start PT. Consults called: Ortho Admission status: MedSurg admission   Lequita Halt MD [facility] Pager [phone]  04/13/2021, 3:45 PM

## 2021-04-13 NOTE — ED Provider Notes (Signed)
Emergency Medicine Provider Triage Evaluation Note  Danielle [name] , a 75 y.o. female  was evaluated in triage.  Pt complains of right knee pain since 8PM, worsening since that time.  Denies fall/trauma.  States not able to walk now.  Hx bilateral knee replacements.  Review of Systems  Positive: Right knee pain Negative: Numbness/tingling  Physical Exam  BP (!) 142/119 (BP Location: Right Arm)   Pulse 81   Temp 98.6 F (37 C) (Oral)   Resp 17   SpO2 95%   Gen:   Awake, no distress   Resp:  Normal effort  MSK:   Moves extremities without difficulty  Other:  Right knee with well healed TKA scar, no noted erythema present, no significant swelling, unwilling to attempt to range knee for exam  Medical Decision Making  Medically screening exam initiated at 2:51 AM.  Appropriate orders placed.  Deborah Lazcano was informed that the remainder of the evaluation will be completed by another provider, this initial triage assessment does not replace that evaluation, and the importance of remaining in the ED until their evaluation is complete.  Atraumatic right knee pain.  She is s/p TKA 2019 right knee.  X-ray ordered.   Larene Pickett, PA-C 04/13/21 0303    Maudie Flakes, MD 04/13/21 [phone]

## 2021-04-13 NOTE — ED Notes (Signed)
Pt back from imaging

## 2021-04-14 ENCOUNTER — Encounter (HOSPITAL_COMMUNITY)
Admission: EM | Disposition: A | Discharge status: Skilled Nursing Facility | Payer: Self-pay | Source: Home / Self Care | Attending: Internal Medicine

## 2021-04-14 ENCOUNTER — Inpatient Hospital Stay (HOSPITAL_COMMUNITY): Payer: Medicare HMO

## 2021-04-14 ENCOUNTER — Inpatient Hospital Stay (HOSPITAL_COMMUNITY): Payer: Medicare HMO | Admitting: Anesthesiology

## 2021-04-14 ENCOUNTER — Encounter (HOSPITAL_COMMUNITY): Payer: Self-pay | Admitting: Internal Medicine

## 2021-04-14 DIAGNOSIS — M009 Pyogenic arthritis, unspecified: Secondary | ICD-10-CM | POA: Diagnosis not present

## 2021-04-14 HISTORY — PX: I & D KNEE WITH POLY EXCHANGE: SHX5024

## 2021-04-14 HISTORY — PX: APPLICATION OF WOUND VAC: SHX5189

## 2021-04-14 LAB — CBC
HCT: 34.1 % — ABNORMAL LOW (ref 36.0–46.0)
Hemoglobin: 10.8 g/dL — ABNORMAL LOW (ref 12.0–15.0)
MCH: 21.3 pg — ABNORMAL LOW (ref 26.0–34.0)
MCHC: 31.7 g/dL (ref 30.0–36.0)
MCV: 67.4 fL — ABNORMAL LOW (ref 80.0–100.0)
Platelets: 107 10*3/uL — ABNORMAL LOW (ref 150–400)
RBC: 5.06 MIL/uL (ref 3.87–5.11)
RDW: 16.3 % — ABNORMAL HIGH (ref 11.5–15.5)
WBC: 12.1 10*3/uL — ABNORMAL HIGH (ref 4.0–10.5)
nRBC: 0 % (ref 0.0–0.2)

## 2021-04-14 LAB — SYNOVIAL CELL COUNT + DIFF, W/ CRYSTALS
Crystals, Fluid: NONE SEEN
Eosinophils-Synovial: 0 % (ref 0–1)
Lymphocytes-Synovial Fld: 2 % (ref 0–20)
Monocyte-Macrophage-Synovial Fluid: 0 % — ABNORMAL LOW (ref 50–90)
Neutrophil, Synovial: 98 % — ABNORMAL HIGH (ref 0–25)
WBC, Synovial: 1600 /mm3 — ABNORMAL HIGH (ref 0–200)

## 2021-04-14 LAB — PATHOLOGIST SMEAR REVIEW

## 2021-04-14 LAB — BASIC METABOLIC PANEL
Anion gap: 12 (ref 5–15)
BUN: 32 mg/dL — ABNORMAL HIGH (ref 8–23)
CO2: 27 mmol/L (ref 22–32)
Calcium: 9.3 mg/dL (ref 8.9–10.3)
Chloride: 102 mmol/L (ref 98–111)
Creatinine, Ser: 1.72 mg/dL — ABNORMAL HIGH (ref 0.44–1.00)
GFR, Estimated: 31 mL/min — ABNORMAL LOW (ref >60)
Glucose, Bld: 94 mg/dL (ref 70–99)
Potassium: 5.6 mmol/L — ABNORMAL HIGH (ref 3.5–5.1)
Sodium: 141 mmol/L (ref 135–145)

## 2021-04-14 LAB — GLUCOSE, CAPILLARY
Glucose-Capillary: 104 mg/dL — ABNORMAL HIGH (ref 70–99)
Glucose-Capillary: 91 mg/dL (ref 70–99)
Glucose-Capillary: 96 mg/dL (ref 70–99)
Glucose-Capillary: 91 mg/dL (ref 70–99)
Glucose-Capillary: 120 mg/dL — ABNORMAL HIGH (ref 70–99)

## 2021-04-14 LAB — NO BLOOD PRODUCTS

## 2021-04-14 LAB — SURGICAL PCR SCREEN
MRSA, PCR: NEGATIVE
Staphylococcus aureus: POSITIVE — AB

## 2021-04-14 LAB — APTT: aPTT: 62 seconds — ABNORMAL HIGH (ref 24–36)

## 2021-04-14 LAB — PROCALCITONIN: Procalcitonin: 1.34 ng/mL

## 2021-04-14 LAB — LACTIC ACID, PLASMA: Lactic Acid, Venous: 1.8 mmol/L (ref 0.5–1.9)

## 2021-04-14 LAB — HEPARIN LEVEL (UNFRACTIONATED): Heparin Unfractionated: >1.1 IU/mL — ABNORMAL HIGH (ref 0.30–0.70)

## 2021-04-14 SURGERY — IRRIGATION AND DEBRIDEMENT KNEE WITH POLY EXCHANGE
Anesthesia: General | Site: Knee | Laterality: Right

## 2021-04-14 MED ORDER — OXYCODONE HCL 5 MG/5ML PO SOLN: 5.0000 mg | Freq: Once | ORAL | Status: AC | PRN

## 2021-04-14 MED ORDER — CHLORHEXIDINE GLUCONATE CLOTH 2 % EX PADS
6.0000 | MEDICATED_PAD | Freq: Every day | CUTANEOUS | Status: AC
  Administered 2021-04-15 – 2021-04-19 (×5): 6 via TOPICAL

## 2021-04-14 MED ORDER — PROPOFOL 10 MG/ML IV BOLUS
INTRAVENOUS | Status: AC
  Filled 2021-04-14: qty 20

## 2021-04-14 MED ORDER — APIXABAN 2.5 MG PO TABS
2.5000 mg | ORAL_TABLET | Freq: Two times a day (BID) | ORAL | Status: AC
  Administered 2021-04-15 – 2021-04-16 (×4): 2.5 mg via ORAL
  Filled 2021-04-14 (×4): qty 1

## 2021-04-14 MED ORDER — OXYCODONE HCL 5 MG PO TABS
5.0000 mg | ORAL_TABLET | Freq: Once | ORAL | Status: AC | PRN
  Administered 2021-04-14: 5 mg via ORAL

## 2021-04-14 MED ORDER — FENTANYL CITRATE (PF) 100 MCG/2ML IJ SOLN: 25.0000 ug | INTRAMUSCULAR | Status: DC | PRN

## 2021-04-14 MED ORDER — CEFAZOLIN SODIUM 1 G IJ SOLR
INTRAMUSCULAR | Status: AC
  Filled 2021-04-14: qty 20

## 2021-04-14 MED ORDER — LACTATED RINGERS IV SOLN: INTRAVENOUS | Status: DC

## 2021-04-14 MED ORDER — DEXAMETHASONE SODIUM PHOSPHATE 10 MG/ML IJ SOLN
INTRAMUSCULAR | Status: AC
  Filled 2021-04-14: qty 1

## 2021-04-14 MED ORDER — ONDANSETRON HCL 4 MG/2ML IJ SOLN
INTRAMUSCULAR | Status: AC
  Filled 2021-04-14: qty 2

## 2021-04-14 MED ORDER — TRANEXAMIC ACID-NACL 1000-0.7 MG/100ML-% IV SOLN
INTRAVENOUS | Status: AC
  Filled 2021-04-14: qty 100

## 2021-04-14 MED ORDER — APIXABAN 5 MG PO TABS
5.0000 mg | ORAL_TABLET | Freq: Two times a day (BID) | ORAL | Status: DC
  Administered 2021-04-14 – 2021-04-15 (×2): 5 mg via ORAL
  Filled 2021-04-14 (×3): qty 1

## 2021-04-14 MED ORDER — SODIUM ZIRCONIUM CYCLOSILICATE 10 G PO PACK
10.0000 g | PACK | Freq: Once | ORAL | Status: AC
Start: 2021-04-14 — End: 2021-04-14
  Administered 2021-04-14: 10 g via ORAL
  Filled 2021-04-14: qty 1

## 2021-04-14 MED ORDER — TRANEXAMIC ACID-NACL 1000-0.7 MG/100ML-% IV SOLN
INTRAVENOUS | Status: DC | PRN
  Administered 2021-04-14: 1000 mg via INTRAVENOUS

## 2021-04-14 MED ORDER — CHLORHEXIDINE GLUCONATE 4 % EX LIQD
60.0000 mL | Freq: Once | CUTANEOUS | Status: AC
  Administered 2021-04-14: 4 via TOPICAL
  Filled 2021-04-14: qty 60

## 2021-04-14 MED ORDER — ONDANSETRON HCL 4 MG/2ML IJ SOLN
INTRAMUSCULAR | Status: DC | PRN
  Administered 2021-04-14: 4 mg via INTRAVENOUS

## 2021-04-14 MED ORDER — LIDOCAINE 2% (20 MG/ML) 5 ML SYRINGE
INTRAMUSCULAR | Status: AC
  Filled 2021-04-14: qty 5

## 2021-04-14 MED ORDER — DEXAMETHASONE SODIUM PHOSPHATE 10 MG/ML IJ SOLN
INTRAMUSCULAR | Status: DC | PRN
  Administered 2021-04-14: 4 mg via INTRAVENOUS

## 2021-04-14 MED ORDER — ORAL CARE MOUTH RINSE: 15.0000 mL | Freq: Once | OROMUCOSAL | Status: AC

## 2021-04-14 MED ORDER — SUGAMMADEX SODIUM 200 MG/2ML IV SOLN
INTRAVENOUS | Status: DC | PRN
  Administered 2021-04-14: 200 mg via INTRAVENOUS

## 2021-04-14 MED ORDER — 0.9 % SODIUM CHLORIDE (POUR BTL) OPTIME
TOPICAL | Status: DC | PRN
  Administered 2021-04-14: 1000 mL

## 2021-04-14 MED ORDER — SODIUM CHLORIDE 0.9 % IR SOLN
Status: DC | PRN
  Administered 2021-04-14 (×3): 3000 mL

## 2021-04-14 MED ORDER — CHLORHEXIDINE GLUCONATE 0.12 % MT SOLN
15.0000 mL | Freq: Once | OROMUCOSAL | Status: AC
  Administered 2021-04-14: 15 mL via OROMUCOSAL
  Filled 2021-04-14: qty 15

## 2021-04-14 MED ORDER — ACETAMINOPHEN 500 MG PO TABS: 1000.0000 mg | ORAL_TABLET | Freq: Once | ORAL | Status: DC | PRN

## 2021-04-14 MED ORDER — APIXABAN 5 MG PO TABS
5.0000 mg | ORAL_TABLET | Freq: Two times a day (BID) | ORAL | Status: DC
  Administered 2021-04-17 – 2021-04-24 (×15): 5 mg via ORAL
  Filled 2021-04-14 (×15): qty 1

## 2021-04-14 MED ORDER — PROPOFOL 10 MG/ML IV BOLUS
INTRAVENOUS | Status: DC | PRN
  Administered 2021-04-14: 20 mg via INTRAVENOUS
  Administered 2021-04-14: 100 mg via INTRAVENOUS

## 2021-04-14 MED ORDER — OXYCODONE HCL 5 MG PO TABS
ORAL_TABLET | ORAL | Status: AC
  Filled 2021-04-14: qty 1

## 2021-04-14 MED ORDER — VANCOMYCIN HCL 1250 MG/250ML IV SOLN
1250.0000 mg | INTRAVENOUS | Status: DC
  Filled 2021-04-14: qty 250

## 2021-04-14 MED ORDER — ROCURONIUM BROMIDE 10 MG/ML (PF) SYRINGE
PREFILLED_SYRINGE | INTRAVENOUS | Status: DC | PRN
  Administered 2021-04-14: 60 mg via INTRAVENOUS

## 2021-04-14 MED ORDER — SURGIRINSE WOUND IRRIGATION SYSTEM - OPTIME
TOPICAL | Status: DC | PRN
  Administered 2021-04-14: 900 mL via TOPICAL

## 2021-04-14 MED ORDER — PHENYLEPHRINE 40 MCG/ML (10ML) SYRINGE FOR IV PUSH (FOR BLOOD PRESSURE SUPPORT)
PREFILLED_SYRINGE | INTRAVENOUS | Status: DC | PRN
  Administered 2021-04-14: 80 ug via INTRAVENOUS

## 2021-04-14 MED ORDER — CEFAZOLIN SODIUM-DEXTROSE 2-3 GM-%(50ML) IV SOLR
INTRAVENOUS | Status: DC | PRN
  Administered 2021-04-14: 2 g via INTRAVENOUS

## 2021-04-14 MED ORDER — FENTANYL CITRATE (PF) 250 MCG/5ML IJ SOLN
INTRAMUSCULAR | Status: AC
  Filled 2021-04-14: qty 5

## 2021-04-14 MED ORDER — SODIUM CHLORIDE 0.9 % IV SOLN: INTRAVENOUS | Status: DC

## 2021-04-14 MED ORDER — VANCOMYCIN HCL 2000 MG/400ML IV SOLN
2000.0000 mg | Freq: Once | INTRAVENOUS | Status: DC
  Filled 2021-04-14: qty 400

## 2021-04-14 MED ORDER — MORPHINE SULFATE (PF) 2 MG/ML IV SOLN
2.0000 mg | INTRAVENOUS | Status: DC | PRN
  Administered 2021-04-14 – 2021-04-15 (×2): 2 mg via INTRAVENOUS
  Filled 2021-04-14 (×2): qty 1

## 2021-04-14 MED ORDER — MUPIROCIN 2 % EX OINT
1.0000 "application " | TOPICAL_OINTMENT | Freq: Two times a day (BID) | CUTANEOUS | Status: AC
  Administered 2021-04-14 – 2021-04-18 (×10): 1 via NASAL
  Filled 2021-04-14: qty 22

## 2021-04-14 MED ORDER — ACETAMINOPHEN 10 MG/ML IV SOLN: 1000.0000 mg | Freq: Once | INTRAVENOUS | Status: DC | PRN

## 2021-04-14 MED ORDER — ACETAMINOPHEN 160 MG/5ML PO SOLN: 1000.0000 mg | Freq: Once | ORAL | Status: DC | PRN

## 2021-04-14 MED ORDER — LIDOCAINE 2% (20 MG/ML) 5 ML SYRINGE
INTRAMUSCULAR | Status: DC | PRN
  Administered 2021-04-14: 100 mg via INTRAVENOUS

## 2021-04-14 MED ORDER — FENTANYL CITRATE (PF) 250 MCG/5ML IJ SOLN
INTRAMUSCULAR | Status: DC | PRN
  Administered 2021-04-14 (×3): 50 ug via INTRAVENOUS

## 2021-04-14 MED ORDER — CEFAZOLIN SODIUM-DEXTROSE 2-4 GM/100ML-% IV SOLN: 2.0000 g | INTRAVENOUS | Status: DC

## 2021-04-14 MED ORDER — MIDAZOLAM HCL 2 MG/2ML IJ SOLN
INTRAMUSCULAR | Status: AC
  Filled 2021-04-14: qty 2

## 2021-04-14 SURGICAL SUPPLY — 59 items
BAG COUNTER SPONGE SURGICOUNT (BAG) ×2 IMPLANT
BAG SURGICOUNT SPONGE COUNTING (BAG) ×1
BAR LOCKING TIBIAL (Orthopedic Implant) IMPLANT
BEARING TIBIAL PS 10X71/75 (Joint) ×1 IMPLANT
BEARING TIBIAL PS 10X71/75MM (Joint) ×1 IMPLANT
BNDG COHESIVE 6X5 TAN NS LF (GAUZE/BANDAGES/DRESSINGS) ×2 IMPLANT
BNDG ELASTIC 6X10 VLCR STRL LF (GAUZE/BANDAGES/DRESSINGS) ×3 IMPLANT
BNDG ELASTIC 6X15 VLCR STRL LF (GAUZE/BANDAGES/DRESSINGS) ×3 IMPLANT
CANISTER WOUND CARE 500ML ATS (WOUND CARE) ×2 IMPLANT
COVER SURGICAL LIGHT HANDLE (MISCELLANEOUS) ×3 IMPLANT
CUFF TOURN SGL QUICK 34 (TOURNIQUET CUFF) ×2
CUFF TRNQT CYL 34X4.125X (TOURNIQUET CUFF) ×1 IMPLANT
DRAIN HEMOVAC 1/8 X 5 (WOUND CARE) ×2 IMPLANT
DRAPE EXTREMITY T 121X128X90 (DISPOSABLE) ×3 IMPLANT
DRAPE IMP U-DRAPE 54X76 (DRAPES) ×3 IMPLANT
DRAPE ORTHO SPLIT 77X108 STRL (DRAPES) ×2
DRAPE SURG ORHT 6 SPLT 77X108 (DRAPES) IMPLANT
DRESSING PEEL AND PLAC PRVNA20 (GAUZE/BANDAGES/DRESSINGS) IMPLANT
DRSG PEEL AND PLACE PREVENA 20 (GAUZE/BANDAGES/DRESSINGS) ×3
DRSG TEGADERM 4X4.75 (GAUZE/BANDAGES/DRESSINGS) ×2 IMPLANT
ELECT BLADE 6.5 EXT (BLADE) ×2 IMPLANT
ELECT CAUTERY BLADE 6.4 (BLADE) ×3 IMPLANT
ELECT REM PT RETURN 9FT ADLT (ELECTROSURGICAL) ×3
ELECTRODE REM PT RTRN 9FT ADLT (ELECTROSURGICAL) ×1 IMPLANT
GAUZE SPONGE 4X4 12PLY STRL (GAUZE/BANDAGES/DRESSINGS) ×2 IMPLANT
GLOVE SRG 8 PF TXTR STRL LF DI (GLOVE) ×1 IMPLANT
GLOVE SURG ENC MOIS LTX SZ7.5 (GLOVE) ×3 IMPLANT
GLOVE SURG SYN 7.5  E (GLOVE) ×2
GLOVE SURG SYN 7.5 E (GLOVE) ×1 IMPLANT
GLOVE SURG SYN 7.5 PF PI (GLOVE) ×1 IMPLANT
GLOVE SURG UNDER POLY LF SZ7.5 (GLOVE) ×3 IMPLANT
GLOVE SURG UNDER POLY LF SZ8 (GLOVE) ×2
GOWN STRL REUS W/ TWL LRG LVL3 (GOWN DISPOSABLE) ×1 IMPLANT
GOWN STRL REUS W/ TWL XL LVL3 (GOWN DISPOSABLE) ×2 IMPLANT
GOWN STRL REUS W/TWL LRG LVL3 (GOWN DISPOSABLE) ×2
GOWN STRL REUS W/TWL XL LVL3 (GOWN DISPOSABLE) ×4
HANDPIECE INTERPULSE COAX TIP (DISPOSABLE) ×4
IMMOBILIZER KNEE 22 UNIV (SOFTGOODS) ×1 IMPLANT
KIT BASIN OR (CUSTOM PROCEDURE TRAY) ×3 IMPLANT
KIT TURNOVER KIT B (KITS) ×3 IMPLANT
MANIFOLD NEPTUNE II (INSTRUMENTS) ×3 IMPLANT
NS IRRIG 1000ML POUR BTL (IV SOLUTION) ×3 IMPLANT
PACK TOTAL JOINT (CUSTOM PROCEDURE TRAY) ×3 IMPLANT
PAD ARMBOARD 7.5X6 YLW CONV (MISCELLANEOUS) ×3 IMPLANT
SET HNDPC FAN SPRY TIP SCT (DISPOSABLE) IMPLANT
SPONGE T-LAP 18X18 ~~LOC~~+RFID (SPONGE) ×2 IMPLANT
STOCKINETTE IMPERVIOUS 9X36 MD (GAUZE/BANDAGES/DRESSINGS) ×2 IMPLANT
SUT ETHILON 2 0 PSLX (SUTURE) ×8 IMPLANT
SUT PDS AB 0 CT 36 (SUTURE) ×2 IMPLANT
SUT PDS AB 1 CT  36 (SUTURE) ×8
SUT PDS AB 1 CT 36 (SUTURE) IMPLANT
SUT PDS AB 2-0 CT1 27 (SUTURE) ×6 IMPLANT
TIBIAL LOCKING BAR (Orthopedic Implant) ×3 IMPLANT
TIP HIGH FLOW IRRIGATION COAX (MISCELLANEOUS) ×2 IMPLANT
TOWEL GREEN STERILE (TOWEL DISPOSABLE) ×3 IMPLANT
TOWEL GREEN STERILE FF (TOWEL DISPOSABLE) ×3 IMPLANT
TUBE CONNECTING 12'X1/4 (SUCTIONS) ×1
TUBE CONNECTING 12X1/4 (SUCTIONS) ×1 IMPLANT
YANKAUER SUCT BULB TIP NO VENT (SUCTIONS) ×2 IMPLANT

## 2021-04-14 NOTE — Progress Notes (Signed)
PROGRESS NOTE  Danielle [name]  DOB: 1946-02-14  PCP: Seward Carol, MD AST:[id number]  DOA: 04/13/2021  LOS: 1 day  Hospital Day: 2  Chief Complaint  Patient presents with   Knee Pain    R    Brief narrative: Danielle [name] is a 75 y.o. 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 Patient presented to the ED from home by EMS on 11/17 with complaint of right knee pain for last 1 week, progressively worsening leading up to an episode of excruciating pain the previous night. In the ED, patient had a temperature of 100.5. Labs with WC count slightly elevated 12.1, CRP 1.7, creatinine 1.66 Right knee arthrocentesis was done  Patient was started on ceftriaxone 2 g. Admitted to hospitalist service. Orthopedics consulted.  Subjective: Patient was seen and examined this morning.  Elderly African-American female.  Propped up in bed.  Tearful with pain.  Not much cooperative to talk because of persistent pain.  Oriented to place.  No restlessness or agitation.  Assessment/Plan: Septic arthritis of prosthetic right knee -Presented with 1 week history of progressively worsening prosthetic right knee -Seen by orthopedics.  Arthrocentesis done. -Plan for knee washout today in OR. -Pain does not seem controlled this morning.  I will start her on IV morphine as needed   Acute on chronic combined systolic and diastolic CHF  Essential hypertension -Home meds include Coreg 25 mg twice daily, Imdur 30 mg daily, losartan 50 mg daily, torsemide 40 mg daily, Aldactone 25 mg daily -Last echo from December 2021 with EF 45 to 98%, grade 2 diastolic dysfunction, mildly elevated pulmonary artery systolic pressure. -Has bilateral pedal edema on presentation -On admission, patient was started on Lasix  40 mg IV daily.  Coreg and losartan continued.  Imdur and Aldactone on hold.  In preparation of surgery, I would stop diuretics and losartan at this time with a plan to resume postprocedure. -Net IO Since Admission: -1,450 mL [04/14/21 1043] -Continue to monitor for daily intake output, weight, blood pressure, BNP, renal function and electrolytes. Recent Labs  Lab 04/13/21 0907 04/14/21 0551  BUN 34* 32*  CREATININE 1.66* 1.72*  K 4.1 5.6*   AKI on CKD3b -Baseline creatinine less than 1.5.  Creatinine up to 1.72 this morning.  Lasix, Aldactone, losartan held. -We will start on gentle IV hydration prior to procedure. Recent Labs    04/25/20 1228 04/13/21 0907 04/14/21 0551  BUN 29* 34* 32*  CREATININE 1.45* 1.66* 1.72*   Hyperkalemia -Potassium up to 5.6 this morning.  Not on potassium supplement but was not Aldactone. -Keep Aldactone on hold.  Give 1 dose of Lokelma.  Continue to monitor potassium level.   Paroxysmal A. fib -Sinus rhythm now, continue Coreg for rate control -Was on Eliquis prior to admission.  Switched to heparin drip on admission.  Currently on hold in preparation of surgery this afternoon.  Type 2 diabetes mellitus -A1c 7.5 in 11/17 -Home meds include Semglee 75 units daily, Premeal insulin, Jardiance 10 mg daily, Neurontin 300 mg twice daily, -Currently on Lantus 10 units at bedtime and sliding scale insulin -Blood sugar level seems controlled Recent Labs  Lab 04/13/21 0527 04/13/21 1810 04/13/21 2156 04/14/21 0805  GLUCAP 82 79 119* 104*   Gout -Continue allopurinol  Hypothyroidism -Synthroid  OSA -CPAP nightly  Mobility: PT eval  postprocedure is required Living condition: Lives at home Goals of care:   Code Status: Full Code  Nutritional status: Body mass index is 38.28 kg/m.     Diet:  Diet Order             Diet NPO time specified Except for: Sips with Meds  Diet effective now                  DVT prophylaxis: Heparin  drip  Antimicrobials: IV Rocephin Fluid: Normal saline at 75 mill per hour Consultants: Orthopedics Family Communication: None at bedside  Status is: Inpatient  Remains inpatient appropriate because: Needs surgery  Dispo: The patient is from: Home              Anticipated d/c is to: Needs PT eval postprocedure              Patient currently is not medically stable to d/c.   Difficult to place patient No     Infusions:   sodium chloride     cefTRIAXone (ROCEPHIN)  IV 2 g (04/14/21 0849)    Scheduled Meds:  allopurinol  100 mg Oral Daily   atorvastatin  80 mg Oral QHS   carvedilol  25 mg Oral BID WC   ferrous sulfate  325 mg Oral Q breakfast   gabapentin  300 mg Oral BID   insulin aspart  0-15 Units Subcutaneous TID WC   insulin glargine-yfgn  10 Units Subcutaneous QHS   latanoprost  1 drop Both Eyes QHS   levothyroxine  75 mcg Oral Q0600    PRN meds: acetaminophen, bisacodyl, hydrALAZINE, morphine injection, ondansetron **OR** ondansetron (ZOFRAN) IV, oxyCODONE, senna-docusate   Antimicrobials: Anti-infectives (From admission, onward)    Start     Dose/Rate Route Frequency Ordered Stop   04/14/21 1000  cefTRIAXone (ROCEPHIN) 2 g in sodium chloride 0.9 % 100 mL IVPB        2 g 200 mL/hr over 30 Minutes Intravenous Every 24 hours 04/13/21 1533     04/13/21 1030  cefTRIAXone (ROCEPHIN) 2 g in sodium chloride 0.9 % 100 mL IVPB        2 g 200 mL/hr over 30 Minutes Intravenous  Once 04/13/21 1018 04/13/21 1158       Objective: Vitals:   04/14/21 0339 04/14/21 0733  BP: 104/75 133/75  Pulse: 84 83  Resp: 20 17  Temp: (!) 100.5 F (38.1 C) 99.4 F (37.4 C)  SpO2: 92% 100%    Intake/Output Summary (Last 24 hours) at 04/14/2021 1043 Last data filed at 04/13/2021 2130 Gross per 24 hour  Intake 0 ml  Output 1450 ml  Net -1450 ml   Filed Weights   04/13/21 0756  Weight: 101.2 kg   Weight change:  Body mass index is 38.28 kg/m.   Physical  Exam: General exam: elderly African-American female, tearful in pain Skin: No rashes, lesions or ulcers. HEENT: Atraumatic, normocephalic, no obvious bleeding Lungs: Clear to auscultation bilaterally CVS: Regular rate and rhythm, no murmur GI/Abd soft, nontender, nondistended, bowel sound present CNS: Alert, awake, oriented to place.  Not cooperative to answer other questions because of pain Psychiatry: Tearful Extremities: Trace bilateral pedal edema, edematous left upper extremity as well.  Data Review: I have personally reviewed the laboratory data and studies available.  F/u labs ordered Unresulted Labs (From admission, onward)     Start     Ordered   04/15/21 0500  Heparin level (unfractionated)  Daily,   R  04/13/21 2025   04/15/21 0500  APTT  Daily,   R      04/13/21 2025   04/14/21 1500  APTT  Once,   R        04/14/21 0645   04/14/21 1003  Surgical pcr screen  Once,   R        04/14/21 1002   04/14/21 0755  Synovial cell count + diff, w/ crystals  Once,   R        04/14/21 0755   04/14/21 0755  Aerobic/Anaerobic Culture w Gram Stain (surgical/deep wound)  Once,   R        04/14/21 0755   04/14/21 0500  Procalcitonin  Daily,   R      04/13/21 1533   04/13/21 1458  Culture, blood (routine x 2)  BLOOD CULTURE X 2,   R      04/13/21 1457   04/13/21 1443  Body fluid culture w Gram Stain  ([facility] ED BODY FLUID PANEL)  ONCE - STAT,   STAT       Question:  Are there also cytology or pathology orders on this specimen?  Answer:  No   04/13/21 1442   04/13/21 1443  Gram stain  ([facility] ED BODY FLUID PANEL)  ONCE - STAT,   STAT       Comments: Stat order placed in ED    04/13/21 1442   04/13/21 1443  Cell count + diff,  w/ cryst-synvl fld  [facility] ED BODY FLUID PANEL)  ONCE - STAT,   STAT        04/13/21 1442   04/13/21 1443  Glucose, Body Fluid Other  ([facility] ED BODY FLUID PANEL)  ONCE - STAT,   STAT        04/13/21 1442   04/13/21 0907  Pathologist smear review  Once,   R         04/13/21 0907   Unscheduled  CBC with Differential/Platelet  Tomorrow morning,   R        04/14/21 1043   Unscheduled  Basic metabolic panel  Tomorrow morning,   R        04/14/21 1043            Signed, Terrilee Croak, MD [facility] 04/14/2021

## 2021-04-14 NOTE — Anesthesia Procedure Notes (Signed)
Procedure Name: Intubation Date/Time: 04/14/2021 4:10 PM Performed by: Jenne Campus, CRNA Pre-anesthesia Checklist: Patient identified, Emergency Drugs available, Suction available and Patient being monitored Patient Re-evaluated:Patient Re-evaluated prior to induction Oxygen Delivery Method: Circle System Utilized Preoxygenation: Pre-oxygenation with 100% oxygen Induction Type: IV induction Ventilation: Mask ventilation without difficulty Laryngoscope Size: Miller Grade View: Grade I Tube type: Oral Tube size: 7.0 mm Number of attempts: 1 Airway Equipment and Method: Stylet and Oral airway Placement Confirmation: ETT inserted through vocal cords under direct vision, positive ETCO2 and breath sounds checked- equal and bilateral Secured at: 21 cm Tube secured with: Tape Dental Injury: Teeth and Oropharynx as per pre-operative assessment

## 2021-04-14 NOTE — Progress Notes (Signed)
ANTICOAGULATION CONSULT NOTE  Pharmacy Consult for Heparin Indication: atrial fibrillation  Allergies  Allergen Reactions   Other Other (See Comments)    Blood Product Refusal (Jehovah's witness)     Sulfa Antibiotics Rash   Uloric [Febuxostat] Rash    Patient Measurements: Height: _0  (162.6 cm) Weight: 101.2 kg (223 lb) IBW/kg (Calculated) : 54.7 Heparin Dosing Weight: 78.2 kg  Vital Signs: Temp: 100 F (37.8 C) (11/18 1522) Temp Source: Oral (11/18 1522) BP: 142/62 (11/18 1538) Pulse Rate: 81 (11/18 1522)  Labs: Recent Labs    04/13/21 0907 04/13/21 1627 04/13/21 1830 04/14/21 0545 04/14/21 0551  HGB 10.7*  --   --   --  10.8*  HCT 35.8*  --   --   --  34.1*  PLT 178  --   --   --  107*  APTT  --   --  31 62*  --   HEPARINUNFRC  --  >1.10*  --  >1.10*  --   CREATININE 1.66*  --   --   --  1.72*     Estimated Creatinine Clearance: 33.2 mL/min (A) (by C-G formula based on SCr of 1.72 mg/dL (H)).   Medical History: 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[place]) 02/05/2017   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 05/20/2018   Acute kidney failure ([place]) 05/15/2018   Arthritis    "all over" (05/22/2018)   Blood dyscrasia    per pt-has small blood cells-appears as if anemic   Breast cancer, left breast ([place]) 1985   CHF (congestive heart failure) ([facility])    Complication of anesthesia    difficult to awaken from per pt   Coronary artery disease    Dyspnea    Essential hypertension    Heart disease    Heart murmur    History of gout    Hypothyroidism    Obesity (BMI 30-39.9) 11/11/2017   OSA (obstructive sleep apnea) 02/18/2017    severe obstructive sleep apnea with an AHI of 75.6/h and mild central sleep apnea with a CAI of 7.7/h.  Oxygen saturations dropped as low as 82%.   He is on CPAP at 9 cm H2O.   OSA on CPAP    Personal history of chemotherapy    Personal history of  radiation therapy    Presence of permanent cardiac pacemaker    Primary localized osteoarthritis of right knee 05/13/2018   Refusal of blood transfusions as patient is Jehovah's Witness    Type II diabetes mellitus ([place])     Medications:  Medications Prior to Admission  Medication Sig Dispense Refill Last Dose   insulin lispro (HUMALOG) 100 UNIT/ML injection Inject 25 Units into the skin 3 (three) times daily before meals.      acetaminophen (TYLENOL) 500 MG tablet Take 500-1,000 mg by mouth every 6 (six) hours as needed (for pain or headaches).      Alcohol Swabs PADS See admin instructions.      allopurinol (ZYLOPRIM) 100 MG tablet Take 100 mg by mouth daily.      apixaban (ELIQUIS) 5 MG TABS tablet Take 1 tablet (5 mg total) by mouth 2 (two) times daily. 60 tablet 11    atorvastatin (LIPITOR) 80 MG tablet Take 1 tablet (80 mg total) by mouth at bedtime. 90 tablet 3    Biotin 10 MG TABS 1 tablet      Blood Glucose Monitoring Suppl (  ONETOUCH VERIO) w/Device KIT Use to check blood sugar 1 kit 0    carvedilol (COREG) 25 MG tablet 1 tablet by mouth 2 times daily with food (Patient taking differently: Take 25 mg by mouth 2 (two) times daily with a meal.) 60 tablet 11    clotrimazole-betamethasone (LOTRISONE) lotion Apply a small amount twice daily to affected area(s) for 14 days, then use as needed for itch.      Continuous Blood Gluc Sensor (DEXCOM G6 SENSOR) MISC 1 Device by Does not apply route as directed. 9 each 3    Continuous Blood Gluc Transmit (DEXCOM G6 TRANSMITTER) MISC 1 Device by Does not apply route as directed. 1 each 3    empagliflozin (JARDIANCE) 10 MG TABS tablet Take 10 mg by mouth daily. DUPLICATE      Ferrous Sulfate (IRON) 325 (65 Fe) MG TABS Take 1 tablet (325 mg total) by mouth daily. 30 tablet 0    gabapentin (NEURONTIN) 300 MG capsule Take 300 mg by mouth 2 (two) times daily.      glucose blood (FREESTYLE LITE) test strip 1 each by Other route in the morning, at noon,  in the evening, and at bedtime. Use as instructed (Patient not taking: No sig reported) 400 each 3    glucose blood (ONETOUCH VERIO) test strip Use to test blood  sugar 4 times a day (Patient not taking: No sig reported) 100 each 12    Insulin Degludec FlexTouch 100 UNIT/ML SOPN Inject 75 Units into the skin daily.      isosorbide mononitrate (IMDUR) 30 MG 24 hr tablet TAKE 1 TABLET BY MOUTH ONCE DAILY (Patient taking differently: 30 mg daily.) 30 tablet 11    Lancets (ONETOUCH ULTRASOFT) lancets Use as  to test blood sugar  4 times a day (Patient not taking: Reported on 04/10/2021) 100 each 12    latanoprost (XALATAN) 0.005 % ophthalmic solution Place 1 drop into both eyes at bedtime.      levothyroxine (SYNTHROID, LEVOTHROID) 75 MCG tablet Take 75 mcg by mouth daily before breakfast.      losartan (COZAAR) 50 MG tablet TAKE 1 TABLET BY MOUTH AT BEDTIME (Patient taking differently: Take 50 mg by mouth at bedtime.) 30 tablet 11    LUMIGAN 0.01 % SOLN       Multiple Vitamins-Minerals (ALIVE ONCE DAILY WOMENS 50+ PO) Take 1 tablet by mouth daily.      mupirocin ointment (BACTROBAN) 2 % Apply to left great toe once daily for 14 days. 30 g 1    nitroGLYCERIN (NITROSTAT) 0.4 MG SL tablet DISSOLVE ONE TABLET UNDER THE TONGUE EVERY 5 MINUTES AS NEEDED FOR CHEST PAIN. 25 tablet 3    Omega-3 Fatty Acids (FISH OIL PO) Take 1 capsule by mouth daily.      potassium chloride (KLOR-CON) 10 MEQ tablet Take 2 tablets by mouth once daily (Patient taking differently: 20 mEq daily.) 60 tablet 11    RELION PEN NEEDLE 31G/8MM 31G X 8 MM MISC USE 1 THREE TIMES DAILY AS DIRECTED      spironolactone (ALDACTONE) 25 MG tablet Take 1 tablet (25 mg total) by mouth daily. 30 tablet 11    tiZANidine (ZANAFLEX) 4 MG tablet Take 2 mg by mouth 3 (three) times daily as needed for muscle spasms (pain).      torsemide (DEMADEX) 20 MG tablet Take 2 tablets (40 mg total) by mouth daily. 60 tablet 11    Travoprost, BAK Free, (TRAVATAN)  0.004 % SOLN ophthalmic  solution Place 1 drop into both eyes at bedtime.        Scheduled:   [MAR Hold] allopurinol  100 mg Oral Daily   [MAR Hold] atorvastatin  80 mg Oral QHS   [MAR Hold] carvedilol  25 mg Oral BID WC   [MAR Hold] Chlorhexidine Gluconate Cloth  6 each Topical Q0600   [MAR Hold] ferrous sulfate  325 mg Oral Q breakfast   [MAR Hold] gabapentin  300 mg Oral BID   [MAR Hold] insulin aspart  0-15 Units Subcutaneous TID WC   [MAR Hold] insulin glargine-yfgn  10 Units Subcutaneous QHS   [MAR Hold] latanoprost  1 drop Both Eyes QHS   [MAR Hold] levothyroxine  75 mcg Oral Q0600   [MAR Hold] mupirocin ointment  1 application Nasal BID   Infusions:   sodium chloride 75 mL/hr at 04/14/21 1208   [MAR Hold] cefTRIAXone (ROCEPHIN)  IV 2 g (04/14/21 0849)   lactated ringers 10 mL/hr at 04/14/21 1555   [START ON 04/16/2021] vancomycin     vancomycin     PRN: 0.9 % irrigation (POUR BTL), [MAR Hold] acetaminophen, [MAR Hold] bisacodyl, [MAR Hold] hydrALAZINE, [MAR Hold]  morphine injection, [MAR Hold] ondansetron **OR** [MAR Hold] ondansetron (ZOFRAN) IV, [MAR Hold] oxyCODONE, [MAR Hold] senna-docusate, sodium chloride irrigation  Assessment: 64 yof with history of CHF, gout, CAD, hypothyroidism, HTN, previous bilateral TKA presenting with right knee pain. Ortho following for concern for possible septic joint. Heparin per pharmacy consult placed for atrial fibrillation. Patient is on apixaban prior to arrival. Last dose PTA is unknown.  Obtained baseline aPTT and heparin level which were 31 and >1.1, respectively. Will require aPTT monitoring due to falsely high anti-Xa level secondary to DOAC use.  Per Dr. Marlowe Aschoff discussion with Ortho MD, ok to resume apixaban tonight post-op. Hg low stable, plt down to 107. SCr stable 1.72. No bleed issues reported.  Goal of Therapy:  Stroke prevention Monitor platelets by anticoagulation protocol: Yes   Plan:  Resume apixaban 22m PO BID  tonight post-op per discussion with MD (approved by Ortho) Monitor CBC, s/sx bleeding   HArturo Morton PharmD, BCPS Please check AMION for all MDefiancecontact numbers Clinical Pharmacist 04/14/2021 4:35 PM

## 2021-04-14 NOTE — Progress Notes (Signed)
Pt lethargic, no meds given at this time, synthroid rescheduled for later time frame

## 2021-04-14 NOTE — Progress Notes (Signed)
Pt arrived to unit, increased pain during assessment, pain meds administered, pt falls right asleep, admission delayed, will re attempt, when pt awakens

## 2021-04-14 NOTE — Transfer of Care (Signed)
Immediate Anesthesia Transfer of Care Note  Patient: Danielle [name]  Procedure(s) Performed: IRRIGATION AND DEBRIDEMENT KNEE WITH POLY EXCHANGE (Right: Knee) APPLICATION OF WOUND VAC (Right: Knee)  Patient Location: PACU  Anesthesia Type:General  Level of Consciousness: drowsy and patient cooperative  Airway & Oxygen Therapy: Patient Spontanous Breathing and Patient connected to nasal cannula oxygen  Post-op Assessment: Report given to RN, Post -op Vital signs reviewed and stable and Patient moving all extremities  Post vital signs: Reviewed and stable  Last Vitals:  Vitals Value Taken Time  BP 105/52 04/14/21 1809  Temp    Pulse    Resp 17 04/14/21 1809  SpO2    Vitals shown include unvalidated device data.  Last Pain:  Vitals:   04/14/21 1522  TempSrc: Oral  PainSc:          Complications: No notable events documented.

## 2021-04-14 NOTE — Progress Notes (Signed)
ANTICOAGULATION CONSULT NOTE  Pharmacy Consult for Heparin Indication: atrial fibrillation  Allergies  Allergen Reactions   Other Other (See Comments)    Blood products- Patient won't accept any Other reaction(s): rash   Sulfa Antibiotics Rash   Uloric [Febuxostat] Rash    Patient Measurements: Height: _0  (162.6 cm) Weight: 101.2 kg (223 lb) IBW/kg (Calculated) : 54.7 Heparin Dosing Weight: 78.2 kg  Vital Signs: Temp: 100.5 F (38.1 C) (11/18 0339) Temp Source: Oral (11/18 0339) BP: 104/75 (11/18 0339) Pulse Rate: 84 (11/18 0339)  Labs: Recent Labs    04/13/21 0907 04/13/21 1627 04/13/21 1830 04/14/21 0545 04/14/21 0551  HGB 10.7*  --   --   --   --   HCT 35.8*  --   --   --   --   PLT 178  --   --   --   --   APTT  --   --  31 62*  --   HEPARINUNFRC  --  >1.10*  --  >1.10*  --   CREATININE 1.66*  --   --   --  1.72*     Estimated Creatinine Clearance: 33.2 mL/min (A) (by C-G formula based on SCr of 1.72 mg/dL (H)).   Medical History: 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[facility]) 02/05/2017   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 05/20/2018   Acute kidney failure ([place]) 05/15/2018   Arthritis    "all over" (05/22/2018)   Blood dyscrasia    per pt-has small blood cells-appears as if anemic   Breast cancer, left breast ([place] [facility]) 1985   CHF (congestive heart failure) ([facility])    Complication of anesthesia    difficult to awaken from per pt   Coronary artery disease    Dyspnea    Essential hypertension    Heart disease    Heart murmur    History of gout    Hypothyroidism    Obesity (BMI 30-39.9) 11/11/2017   OSA (obstructive sleep apnea) 02/18/2017    severe obstructive sleep apnea with an AHI of 75.6/h and mild central sleep apnea with a CAI of 7.7/h.  Oxygen saturations dropped as low as 82%.   He is on CPAP at 9 cm H2O.   OSA on CPAP    Personal history of chemotherapy     Personal history of radiation therapy    Presence of permanent cardiac pacemaker    Primary localized osteoarthritis of right knee 05/13/2018   Refusal of blood transfusions as patient is Jehovah's Witness    Type II diabetes mellitus ([place])     Medications:  Medications Prior to Admission  Medication Sig Dispense Refill Last Dose   acetaminophen (TYLENOL) 500 MG tablet Take 500-1,000 mg by mouth every 6 (six) hours as needed (for pain or headaches).      Alcohol Swabs PADS See admin instructions.      allopurinol (ZYLOPRIM) 100 MG tablet 1 tablet      apixaban (ELIQUIS) 5 MG TABS tablet Take 1 tablet (5 mg total) by mouth 2 (two) times daily. 60 tablet 11    atorvastatin (LIPITOR) 80 MG tablet TAKE 1 TABLET BY MOUTH AT BEDTIME 90 tablet 0    atorvastatin (LIPITOR) 80 MG tablet Take 1 tablet (80 mg total) by mouth at bedtime. 90 tablet 3    Biotin 10 MG TABS 1 tablet      Blood Glucose Monitoring Suppl [facility]  VERIO) w/Device KIT Use to check blood sugar 1 kit 0    carvedilol (COREG) 25 MG tablet 1 tablet by mouth 2 times daily with food 60 tablet 11    clotrimazole-betamethasone (LOTRISONE) lotion Apply a small amount twice daily to affected area(s) for 14 days, then use as needed for itch.      Continuous Blood Gluc Sensor (DEXCOM G6 SENSOR) MISC 1 Device by Does not apply route as directed. 9 each 3    Continuous Blood Gluc Transmit (DEXCOM G6 TRANSMITTER) MISC 1 Device by Does not apply route as directed. 1 each 3    empagliflozin (JARDIANCE) 10 MG TABS tablet 1 tablet      empagliflozin (JARDIANCE) 10 MG TABS tablet Take 10 mg by mouth daily. DUPLICATE      Ferrous Sulfate (IRON) 325 (65 Fe) MG TABS Take 1 tablet (325 mg total) by mouth daily. 30 tablet 0    gabapentin (NEURONTIN) 300 MG capsule Take 300 mg by mouth 2 (two) times daily.      glucose blood (FREESTYLE LITE) test strip 1 each by Other route in the morning, at noon, in the evening, and at bedtime. Use as instructed  (Patient not taking: No sig reported) 400 each 3    glucose blood (ONETOUCH VERIO) test strip Use to test blood  sugar 4 times a day (Patient not taking: No sig reported) 100 each 12    insulin aspart (NOVOLOG FLEXPEN) 100 UNIT/ML FlexPen Max daily 30 units per correction scale 15 mL 11    insulin glargine-yfgn (SEMGLEE, YFGN,) 100 UNIT/ML injection Inject 0.75 mLs (75 Units total) into the skin daily. 30 mL 11    isosorbide mononitrate (IMDUR) 30 MG 24 hr tablet TAKE 1 TABLET BY MOUTH ONCE DAILY 30 tablet 11    Lancets (ONETOUCH ULTRASOFT) lancets Use as  to test blood sugar  4 times a day (Patient not taking: Reported on 04/10/2021) 100 each 12    latanoprost (XALATAN) 0.005 % ophthalmic solution 1 drop into affected eye in the evening      levothyroxine (SYNTHROID, LEVOTHROID) 75 MCG tablet Take 75 mcg by mouth daily before breakfast.      losartan (COZAAR) 50 MG tablet TAKE 1 TABLET BY MOUTH AT BEDTIME 30 tablet 11    LUMIGAN 0.01 % SOLN       Multiple Vitamins-Minerals (ALIVE ONCE DAILY WOMENS 50+ PO) Take 1 tablet by mouth daily.      mupirocin ointment (BACTROBAN) 2 % Apply to left great toe once daily for 14 days. 30 g 1    nitroGLYCERIN (NITROSTAT) 0.4 MG SL tablet DISSOLVE ONE TABLET UNDER THE TONGUE EVERY 5 MINUTES AS NEEDED FOR CHEST PAIN. 25 tablet 3    Omega-3 Fatty Acids (FISH OIL PO) Take 1 capsule by mouth daily.      potassium chloride (KLOR-CON) 10 MEQ tablet Take 2 tablets by mouth once daily 60 tablet 11    RELION PEN NEEDLE 31G/8MM 31G X 8 MM MISC USE 1 THREE TIMES DAILY AS DIRECTED      spironolactone (ALDACTONE) 25 MG tablet Take 1 tablet (25 mg total) by mouth daily. 30 tablet 11    torsemide (DEMADEX) 20 MG tablet Take 2 tablets (40 mg total) by mouth daily. 60 tablet 11    Travoprost, BAK Free, (TRAVATAN) 0.004 % SOLN ophthalmic solution Place 1 drop into both eyes at bedtime.        Scheduled:   allopurinol  100 mg Oral Daily  atorvastatin  80 mg Oral QHS    carvedilol  25 mg Oral BID WC   ferrous sulfate  325 mg Oral Q breakfast   furosemide  40 mg Intravenous Daily   gabapentin  300 mg Oral BID   insulin aspart  0-15 Units Subcutaneous TID WC   insulin glargine-yfgn  10 Units Subcutaneous QHS   latanoprost  1 drop Both Eyes QHS   levothyroxine  75 mcg Oral Q0600   losartan  50 mg Oral Daily   Infusions:   cefTRIAXone (ROCEPHIN)  IV     heparin 1,100 Units/hr (04/13/21 2123)   PRN: acetaminophen, bisacodyl, hydrALAZINE, HYDROmorphone (DILAUDID) injection, ondansetron **OR** ondansetron (ZOFRAN) IV, oxyCODONE, senna-docusate  Assessment: 65 yof with a history of CHF, gout, CAD, hypothyroidism, HTN, previous bilateral TKA. Patient is presenting with right knee pain. Ortho following for concern for possible septic joint. Heparin per pharmacy consult placed for atrial fibrillation.  Patient is on apixaban prior to arrival. Last dose is unknown.  Obtained baseline aPTT and heparin level which were 31 and >1.1, respectively. Will require aPTT monitoring due to likely falsely high anti-Xa level secondary to DOAC use.  Hgb 10.7; plt 178  11/18 AM update:  aPTT below goal  Goal of Therapy:  Heparin level 0.3-0.7 units/ml aPTT 66-102 seconds Monitor platelets by anticoagulation protocol: Yes   Plan:  Inc heparin to 1250 units/hr 1500 aPTT  Narda Bonds, PharmD, BCPS Clinical Pharmacist Phone: [phone]

## 2021-04-14 NOTE — Progress Notes (Signed)
Patient very drowsy this AM. Unable to keep eyes on. Unable to answer orientation questions correctly, keeps telling RN her name over and over. VSS. BGM stable. Ortho at bedside to aspirate knee earlier this AM. Last Pain medication given at 0330.   Provider paged to make aware of patient confusion, abd distention and Elevated Potassium. Per MD, patient has septic knee and confusion can be present. Came to bedside to assess patient. Rapid response nurse came to bedside as well to look for any neuro changes.  At this time patient is drowsy, but will talk to staff when needed. C/o pain, MD adjusted pain medications and will apply ICE to knee.

## 2021-04-14 NOTE — Anesthesia Preprocedure Evaluation (Addendum)
Anesthesia Evaluation  Patient identified by MRN, date of birth, ID band  Reviewed: Allergy & Precautions, NPO status , Patient's Chart, lab work & pertinent test results  History of Anesthesia Complications (+) history of anesthetic complications  Airway Mallampati: III  TM Distance: >3 FB Neck ROM: Full    Dental  (+) Edentulous Upper, Poor Dentition, Dental Advisory Given   Pulmonary shortness of breath, sleep apnea and Continuous Positive Airway Pressure Ventilation , former smoker,    breath sounds clear to auscultation       Cardiovascular hypertension, Pt. on home beta blockers and Pt. on medications + CAD and +CHF  + pacemaker  Rhythm:Regular  1. Left ventricular ejection fraction, by estimation, is 45 to 50%. The  left ventricle has mildly decreased function. The left ventricle  demonstrates regional wall motion abnormalities (see scoring  diagram/findings for description). There is mild  concentric left ventricular hypertrophy. Left ventricular diastolic  parameters are consistent with Grade II diastolic dysfunction  (pseudonormalization). Elevated left atrial pressure. There is mild  hypokinesis of the left ventricular, apical inferior  wall, anterior wall and septal wall.  2. Right ventricular systolic function is normal. The right ventricular  size is normal. There is mildly elevated pulmonary artery systolic  pressure.  3. Left atrial size was severely dilated.  4. The mitral valve is degenerative. Mild to moderate mitral valve  regurgitation. Mild mitral stenosis. The mean mitral valve gradient is 4.0  mmHg. Moderate mitral annular calcification.  5. The aortic valve is tricuspid. There is mild calcification of the  aortic valve. There is mild thickening of the aortic valve. Aortic valve  regurgitation is mild. Mild aortic valve sclerosis is present, with no  evidence of aortic valve stenosis.  6. The inferior  vena cava is dilated in size with <50% respiratory  variability, suggesting right atrial pressure of 15 mmHg.  Neg 2017 stress test  [employer] pacemaker AP/VS 14%   Neuro/Psych  Neuromuscular disease negative psych ROS   GI/Hepatic negative GI ROS, Neg liver ROS,   Endo/Other  diabetes, Insulin DependentHypothyroidism Lab Results      Component                Value               Date                      HGBA1C                   7.5 (H)             04/13/2021             Renal/GU ARFRenal diseaseLab Results      Component                Value               Date                      CREATININE               1.72 (H)            04/14/2021           Lab Results      Component                Value  Date                      K                        5.6 (H)             04/14/2021                Musculoskeletal  (+) Arthritis ,   Abdominal   Peds  Hematology  (+) Blood dyscrasia, anemia , Lab Results      Component                Value               Date                      WBC                      12.1 (H)            04/14/2021                HGB                      10.8 (L)            04/14/2021                HCT                      34.1 (L)            04/14/2021                MCV                      67.4 (L)            04/14/2021                PLT                      107 (L)             04/14/2021              Anesthesia Other Findings Refusal of blood transfusions as patient is Jehovah's Witness  Reproductive/Obstetrics                             Anesthesia Physical Anesthesia Plan  ASA: 3 and emergent  Anesthesia Plan: General   Post-op Pain Management:    Induction: Intravenous  PONV Risk Score and Plan: 3 and Ondansetron and Dexamethasone  Airway Management Planned: Oral ETT and LMA  Additional Equipment:   Intra-op Plan:   Post-operative Plan: Extubation in OR  Informed Consent: I have reviewed the  patients History and Physical, chart, labs and discussed the procedure including the risks, benefits and alternatives for the proposed anesthesia with the patient or authorized representative who has indicated his/her understanding and acceptance.     Dental advisory given  Plan Discussed with: CRNA and Anesthesiologist  Anesthesia Plan Comments:        Anesthesia Quick Evaluation

## 2021-04-14 NOTE — Plan of Care (Signed)
  Problem: Education: Goal: Knowledge of General Education information will improve Description: Including pain rating scale, medication(s)/side effects and non-pharmacologic comfort measures 04/14/2021 0651 by Catalina Antigua, RN Outcome: Progressing 04/14/2021 0651 by Catalina Antigua, RN Outcome: Progressing   Problem: Health Behavior/Discharge Planning: Goal: Ability to manage health-related needs will improve 04/14/2021 0651 by Catalina Antigua, RN Outcome: Progressing 04/14/2021 0651 by Catalina Antigua, RN Outcome: Progressing   Problem: Clinical Measurements: Goal: Ability to maintain clinical measurements within normal limits will improve 04/14/2021 0651 by Catalina Antigua, RN Outcome: Progressing 04/14/2021 0651 by Catalina Antigua, RN Outcome: Progressing Goal: Will remain free from infection 04/14/2021 0651 by Catalina Antigua, RN Outcome: Progressing 04/14/2021 0651 by Catalina Antigua, RN Outcome: Progressing Goal: Diagnostic test results will improve 04/14/2021 0651 by Catalina Antigua, RN Outcome: Progressing 04/14/2021 0651 by Catalina Antigua, RN Outcome: Progressing Goal: Respiratory complications will improve 04/14/2021 0651 by Catalina Antigua, RN Outcome: Progressing 04/14/2021 0651 by Catalina Antigua, RN Outcome: Progressing Goal: Cardiovascular complication will be avoided 04/14/2021 0651 by Catalina Antigua, RN Outcome: Progressing 04/14/2021 0651 by Catalina Antigua, RN Outcome: Progressing   Problem: Clinical Measurements: Goal: Will remain free from infection 04/14/2021 0651 by Catalina Antigua, RN Outcome: Progressing 04/14/2021 0651 by Catalina Antigua, RN Outcome: Progressing   Problem: Activity: Goal: Risk for activity intolerance will decrease Outcome: Progressing   Problem: Nutrition: Goal: Adequate nutrition will be maintained Outcome: Progressing

## 2021-04-14 NOTE — Progress Notes (Signed)
Patient is drowsy.  With stimulation she will awake and answer questions appropriately.   BP 133/75  HR 83  RR 17  O2 sat 100%.  RN to call if patient becomes difficult to arouse or has changes in her VS.

## 2021-04-14 NOTE — Progress Notes (Signed)
Report given to Short Stay RN. RN made aware about Staph in nose, ointment given. BGM checked and stable. Beta blocker given. Daughter at bedside, will be going down with patient to talk to providers.

## 2021-04-14 NOTE — Op Note (Signed)
DATE OF SURGERY:  04/14/2021 TIME: 6:02 PM  PATIENT NAME:  Luna Glasgow   AGE: 75 y.o.    PRE-OPERATIVE DIAGNOSIS: Right total knee arthroplasty septic arthritis  POST-OPERATIVE DIAGNOSIS:  Same  PROCEDURE: Irrigation and debridement and polyexchange of infected right total Knee Arthroplasty  SURGEON:  Hari Casaus A Bird Tailor, MD   ASSISTANT: Merlene Pulling, PA, present and scrubbed throughout the case, critical for assistance with exposure, retraction, instrumentation, and closure.   OPERATIVE IMPLANTS:  Implant Name Type Inv. Item Serial No. Manufacturer Lot No. LRB No. Used Action  TIBIAL Elenor Legato - [id number] Orthopedic Implant TIBIAL LOCKING BAR  [employer]MER [employer]ON(ORTH,TRAU,BIO,SG) [id number] Right 1 Implanted  BEARING TIBIAL PS 10X71/75MM - [id number] Joint BEARING TIBIAL PS 10X71/75MM  ZIMMER [employer]ON(ORTH,TRAU,BIO,SG) [id number] Right 1 Implanted      PREOPERATIVE INDICATIONS:  Leler Brion is a 75 y.o. year old female who had total knee arthroplasty with Dr. Mardelle Matte approximately 3 years ago.  She had done well since surgery but unfortunately developed acute pain yesterday morning in the right knee.  She denied any recent trauma.  She was unable to ambulate or range the knee she had severe tenderness to palpation and discomfort with any manipulation of the knee.  The knee was aspirated and cloudy yellow fluid was expressed concerning for infection this was sent for cell count and culture.  Cell count noted increased nucleated cell count and high neutrophil percent of 98%.  She also had a white count and elevated CRP.  Given high suspicion for prosthetic joint infection we elected to proceed with irrigation debridement and polyexchange.  The risks, benefits, and alternatives were discussed at length including but not limited to the risks of infection, bleeding, nerve injury, stiffness, blood clots, the need for revision surgery, cardiopulmonary complications, among others, and they were willing to  proceed.  OPERATIVE FINDINGS AND UNIQUE ASPECTS OF THE CASE: Abundant dark red cloudy purulent appearing fluid.  Complete knee synovectomy and polyexchange.  Tissues appear to healthy and clean at the end of the case.  ESTIMATED BLOOD LOSS: 100 cc  OPERATIVE DESCRIPTION:   Once adequate anesthesia, preoperative antibiotics, 2 gm of ,1 gm of Tranexamic Acid administered, the patient was positioned supine with a thigh tourniquet placed.  The right lower extremity was prepped and draped in sterile fashion.  A time-  out was performed identifying the patient, planned procedure, and the appropriate extremity.     The leg was  not-exsanguinated in the setting of an infection but simply elevated, tourniquet elevated to 250 mmHg.  A midline incision was   made through her old scar.  Full-thickness skin flaps were elevated over the retinaculum.  Followed by median parapatellar arthrotomy. Anterio medial synovectomy was released and resected. A medial release was performed, the infrapatellar fat pad was resected with care taken to protect the patellar tendon.  Complete 360 degree synovectomy was performed.  3 tissue samples from the synovectomy were collected and sent for aerobic and anaerobic cultures.  Following complete synovectomy and thorough debridement of any loose tissue the old poly liner was removed.  We then assessed stability using a 10 mm PS polytrial.  With this trial we felt that the knee was well balanced and elected to again use a 10 mm PS poly for the final implant.  Knee was irrigated with 6 L of normal saline using pulse lavage.  The knee was then irrigated with dilute Betadine solution, surgiphor.  The [place] layer was cleaned with the surgeon for solution as  well.  Our gloves were removed and a new clean stockinette and drape was placed.  Additionally we obtained new suction and pulse lavage.  We also remove the old retractors and new clean retractors were used.  The the knee was irrigated  to remove the remaining Betadine solution.  The final polyliner was opened and implanted.  The locking mechanism was also engaged.  Patient had excellent stability and range of motion with the final polyimplanted.  The knee was again irrigated with pulse lavage.  A total of 9 L of pulse lavage was used during the case.  A Hemovac drain was placed in the knee.  The medial parapatellar arthrotomy was then reapproximated using interrupted #1 PDS with the knee  in flexion.  The   remaining wound was closed with interrupted 2-0 PDS and 2-0 nylon..   The knee was cleaned, dried, dressed with a Prevena VAC dressing.  The drain site was dressed with a gauze and Tegaderm.  The knee was wrapped with an Ace wrap. The patient was then  brought to recovery room in stable condition, tolerating the procedure  well. There were no complications.   Post op recs: WB: WBAT Abx: Broad-spectrum antibiotics postoperative.  Patient will receive vancomycin and ceftriaxone pending aspiration and Intra-Op cultures.  Plan for ID consult.  Anticipate likely 6 weeks of IV antibiotics. Imaging: PACU xrays DVT prophylaxis: Resume Eliquis 2.5 twice daily starting postop day 1-2.  Then return to full anticoagulation dose 5 mg Eliquis twice daily starting postop day 3 Dressing: Prevena VAC dressing to be changed on postop day 7 to dry dressing. Drain: Anticipate drain removal removal after postop day 3 assuming output less than 30 cc per shift. Follow up: 2 weeks after surgery for a wound check with Dr. Zachery Dakins at [facility].  Address: [address], [place], [place] [address]  Office Phone: [phone]  Charlies Constable, MD Orthopaedic Surgery

## 2021-04-14 NOTE — Progress Notes (Signed)
Pharmacy Antibiotic Note  Danielle [name] is a 75 y.o. female admitted on 04/13/2021 with cellulitis,  concern for prosthetic joint infection, s/p fluid aspiration and I&D 11/18 .  Pharmacy has been consulted for vancomycin dosing. Already on ceftriaxone per MD.  Plan: Vancomycin 2g IV x 1; then 1250mg  IV q48h. Goal AUC 400-550. Expected AUC: 495 SCr used: 1.72 Ceftriaxone 2g IV q24h Monitor clinical progress, c/s, renal function F/u de-escalation plan/LOT, vancomycin levels as indicated   Height: 5\' 4"  (162.6 cm) Weight: 101.2 kg (223 lb) IBW/kg (Calculated) : 54.7  Temp (24hrs), Avg:99.6 F (37.6 C), Min:98.8 F (37.1 C), Max:100.5 F (38.1 C)  Recent Labs  Lab 04/13/21 0907 04/13/21 1458 04/14/21 0551  WBC 12.1*  --  12.1*  CREATININE 1.66*  --  1.72*  LATICACIDVEN  --  1.1 1.8    Estimated Creatinine Clearance: 33.2 mL/min (A) (by C-G formula based on SCr of 1.72 mg/dL (H)).    Allergies  Allergen Reactions   Other Other (See Comments)    Blood Product Refusal (Jehovah's witness)     Sulfa Antibiotics Rash   Uloric [Febuxostat] Rash    Arturo Morton, PharmD, BCPS Please check AMION for all [place] contact numbers Clinical Pharmacist 04/14/2021 4:14 PM

## 2021-04-14 NOTE — Interval H&P Note (Signed)
Cell count from aspiration fluid yielded 1600 nucleated cells with 98% neutrophils.  This is concerning for prosthetic joint infection, especially given high clinical suspicion given the white count, severe pain, atraumatic effusion, and purulent appearing fluid aspiration.  We will plan to proceed with irrigation debridement of the right knee with polyexchange.  Risks of surgery were discussed including pain, stiffness, recurrence of infection, nerve injury, bleeding.  Discussed that if the infection ultimately does not clear with this procedure and of she develops a chronic prosthetic joint infection the next step would likely be a two-stage revision with an antibiotic stay spacer.  Given the relatively short duration of symptoms, I am hopeful that she will respond well to this procedure and not need additional surgery or removal of her implants.  Daughter Danielle [name] is at bedside.  Patient and daughter expressed understanding and agreement with the plan. Surgical consent was signed and the right leg was marked.

## 2021-04-15 DIAGNOSIS — M009 Pyogenic arthritis, unspecified: Secondary | ICD-10-CM | POA: Diagnosis not present

## 2021-04-15 LAB — GLUCOSE, CAPILLARY
Glucose-Capillary: 145 mg/dL — ABNORMAL HIGH (ref 70–99)
Glucose-Capillary: 193 mg/dL — ABNORMAL HIGH (ref 70–99)
Glucose-Capillary: 188 mg/dL — ABNORMAL HIGH (ref 70–99)
Glucose-Capillary: 194 mg/dL — ABNORMAL HIGH (ref 70–99)

## 2021-04-15 LAB — BASIC METABOLIC PANEL
Anion gap: 9 (ref 5–15)
BUN: 36 mg/dL — ABNORMAL HIGH (ref 8–23)
CO2: 28 mmol/L (ref 22–32)
Calcium: 8.6 mg/dL — ABNORMAL LOW (ref 8.9–10.3)
Chloride: 102 mmol/L (ref 98–111)
Creatinine, Ser: 1.86 mg/dL — ABNORMAL HIGH (ref 0.44–1.00)
GFR, Estimated: 28 mL/min — ABNORMAL LOW (ref >60)
Glucose, Bld: 175 mg/dL — ABNORMAL HIGH (ref 70–99)
Potassium: 4.5 mmol/L (ref 3.5–5.1)
Sodium: 139 mmol/L (ref 135–145)

## 2021-04-15 LAB — PROCALCITONIN: Procalcitonin: 1.8 ng/mL

## 2021-04-15 LAB — CBC WITH DIFFERENTIAL/PLATELET
Abs Immature Granulocytes: 0.03 10*3/uL (ref 0.00–0.07)
Basophils Absolute: 0 10*3/uL (ref 0.0–0.1)
Basophils Relative: 0 %
Eosinophils Absolute: 0 10*3/uL (ref 0.0–0.5)
Eosinophils Relative: 0 %
HCT: 31.7 % — ABNORMAL LOW (ref 36.0–46.0)
Hemoglobin: 9.6 g/dL — ABNORMAL LOW (ref 12.0–15.0)
Immature Granulocytes: 0 %
Lymphocytes Relative: 11 %
Lymphs Abs: 1.2 10*3/uL (ref 0.7–4.0)
MCH: 20.9 pg — ABNORMAL LOW (ref 26.0–34.0)
MCHC: 30.3 g/dL (ref 30.0–36.0)
MCV: 69.1 fL — ABNORMAL LOW (ref 80.0–100.0)
Monocytes Absolute: 0.6 10*3/uL (ref 0.1–1.0)
Monocytes Relative: 5 %
Neutro Abs: 9.1 10*3/uL — ABNORMAL HIGH (ref 1.7–7.7)
Neutrophils Relative %: 84 %
Platelets: 141 10*3/uL — ABNORMAL LOW (ref 150–400)
RBC: 4.59 MIL/uL (ref 3.87–5.11)
RDW: 15.9 % — ABNORMAL HIGH (ref 11.5–15.5)
WBC: 10.9 10*3/uL — ABNORMAL HIGH (ref 4.0–10.5)
nRBC: 0 % (ref 0.0–0.2)

## 2021-04-15 MED ORDER — VANCOMYCIN HCL 1500 MG/300ML IV SOLN
1500.0000 mg | Freq: Once | INTRAVENOUS | Status: AC
  Administered 2021-04-15: 1500 mg via INTRAVENOUS
  Filled 2021-04-15: qty 300

## 2021-04-15 MED ORDER — VANCOMYCIN HCL 1250 MG/250ML IV SOLN
1250.0000 mg | INTRAVENOUS | Status: DC
  Filled 2021-04-15: qty 250

## 2021-04-15 MED ORDER — POLYETHYLENE GLYCOL 3350 17 G PO PACK
17.0000 g | PACK | Freq: Every day | ORAL | Status: DC | PRN
  Administered 2021-04-20: 17 g via ORAL
  Filled 2021-04-15: qty 1

## 2021-04-15 MED ORDER — DOCUSATE SODIUM 100 MG PO CAPS
100.0000 mg | ORAL_CAPSULE | Freq: Every day | ORAL | Status: DC
  Administered 2021-04-15 – 2021-04-21 (×7): 100 mg via ORAL
  Filled 2021-04-15 (×7): qty 1

## 2021-04-15 NOTE — Plan of Care (Signed)

## 2021-04-15 NOTE — Progress Notes (Signed)
Pharmacy Antibiotic Note  Danielle [name] is a 76 y.o. female admitted on 04/13/2021 with cellulitis,  concern for prosthetic joint infection, s/p fluid aspiration and I&D 11/18 .  Pharmacy has been consulted for vancomycin dosing. Already on ceftriaxone per MD.  Vanc load was not given yesterday. We will re-schedule for today.   Plan: Vancomycin 1.5g IV x 1; then 1250mg  IV q48h. Goal AUC 400-550. Expected AUC: 528 SCr used: 1.86 Ceftriaxone 2g IV q24h Monitor clinical progress, c/s, renal function F/u de-escalation plan/LOT, vancomycin levels as indicated   Height: 5\' 4"  (162.6 cm) Weight: 101.2 kg (223 lb) IBW/kg (Calculated) : 54.7  Temp (24hrs), Avg:98.7 F (37.1 C), Min:98 F (36.7 C), Max:100 F (37.8 C)  Recent Labs  Lab 04/13/21 0907 04/13/21 1458 04/14/21 0551 04/15/21 0215  WBC 12.1*  --  12.1* 10.9*  CREATININE 1.66*  --  1.72* 1.86*  LATICACIDVEN  --  1.1 1.8  --      Estimated Creatinine Clearance: 30.7 mL/min (A) (by C-G formula based on SCr of 1.86 mg/dL (H)).    Allergies  Allergen Reactions   Other Other (See Comments)    Blood Product Refusal (Jehovah's witness)     Sulfa Antibiotics Rash   Uloric [Febuxostat] Rash    Onnie Boer, PharmD, [place], AAHIVP, CPP Infectious Disease Pharmacist 04/15/2021 2:31 PM

## 2021-04-15 NOTE — Consult Note (Addendum)
[facility] for Infectious Disease    Date of Admission:  04/13/2021   Total days of inpatient antibiotics 3        Reason for Consult: PJI    Principal Problem:   Septic arthritis ([place]) Active Problems:   Acute systolic CHF (congestive heart failure) [facility])   Assessment: 75 year old female with breast cancer status post chemo lumpectomy, bilateral knee replacements, diabetes with A1c 7.5 on 04/13/2021 admitted for right knee PJI.  Patient reported right knee.  Started day prior to admission.  Ortho has been consulted and patient underwent I&D of right knee with polyexchange.  Assessment: #Right knee PJI SP I&D and poly exchange on 11/8 #OR Cx form 11/18+ staph aureus #Diabetes -Per operative note of purulent fluid was encountered Recommendations -Continue Vancomycin and ceftriaxone -Will follow OR Cx - As I&D with retention of prosthesis was done, will need to discuss adding rifampin. As pt is on Eliquis. Also, if there are no plans for total hardware removal then we need to look at long term suppressive antibiotics past 6 weeks of PJI therapy.    Microbiology:   Antibiotics: Ceftriaxone 11/17-p Vancomycin 11/19 Cefazolin 11/18 Cultures: OR Cx on 11/18: rare staph aureus, re-incubating  HPI: Genella Bas is a 75 y.o. female CHF, CKD stage III, diabetes, chronic mycotic otitis externa treated with debridement and topical anti-fungal. HTN, bilateral knee replacement, breast cancer status post chemo lumpectomy, OSA on CPAP, hypothyroidism, morbid obesity presented for right knee pain.  Patient reports she developed right knee pain 1 day prior to admission and gradually became worse.  She was unable to get out of bed daughter called EMS.  On arrival to the ED she had a temp 100.5. Per ortho right knee arthrocentesis  showed cloudy yellow fluid with 1600 nucleated cells, 98% neutrophils.  She was started on ceftriaxone.  Orthopedics was consulted and patient underwent  I&D with polyethylene exchange.Purulent appearing fluid noted on operative findings. Today, pt is resting in bed. She reports she gets around with a walker. She has had sort of a chronic knee pain which acutely worsened day prior to admission.  Review of Systems: Review of Systems  All other systems reviewed and are negative.  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[place]) 02/05/2017   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 05/20/2018   Acute kidney failure ([place]) 05/15/2018   Arthritis    "all over" (05/22/2018)   Blood dyscrasia    per pt-has small blood cells-appears as if anemic   Breast cancer, left breast ([place]) 1985   CHF (congestive heart failure) ([facility])    Complication of anesthesia    difficult to awaken from per pt   Coronary artery disease    Dyspnea    Essential hypertension    Heart disease    Heart murmur    History of gout    Hypothyroidism    Obesity (BMI 30-39.9) 11/11/2017   OSA (obstructive sleep apnea) 02/18/2017    severe obstructive sleep apnea with an AHI of 75.6/h and mild central sleep apnea with a CAI of 7.7/h.  Oxygen saturations dropped as low as 82%.   He is on CPAP at 9 cm H2O.   OSA on CPAP    Personal history of chemotherapy    Personal history of radiation therapy    Presence of permanent cardiac pacemaker    Primary localized osteoarthritis of  right knee 05/13/2018   Refusal of blood transfusions as patient is Jehovah's Witness    Type II diabetes mellitus ([place])     Social History   Tobacco Use   Smoking status: Former    Packs/day: 1.00    Years: 22.00    Pack years: 22.00    Types: Cigarettes    Quit date: 8    Years since quitting: 39.9   Smokeless tobacco: Never  Vaping Use   Vaping Use: Never used  Substance Use Topics   Alcohol use: Yes    Comment: 05/22/2018 "glass of wine couple times/wk"   Drug use: Not Currently    Family History  Problem  Relation Age of Onset   Multiple myeloma Mother    Heart disease Father    Other Sister    Heart disease Brother    Diabetes Sister    Other Sister    Scheduled Meds:  allopurinol  100 mg Oral Daily   apixaban  2.5 mg Oral BID   [START ON 04/17/2021] apixaban  5 mg Oral BID   atorvastatin  80 mg Oral QHS   carvedilol  25 mg Oral BID WC   Chlorhexidine Gluconate Cloth  6 each Topical Q0600   docusate sodium  100 mg Oral Daily   ferrous sulfate  325 mg Oral Q breakfast   gabapentin  300 mg Oral BID   insulin aspart  0-15 Units Subcutaneous TID WC   insulin glargine-yfgn  10 Units Subcutaneous QHS   latanoprost  1 drop Both Eyes QHS   levothyroxine  75 mcg Oral Q0600   mupirocin ointment  1 application Nasal BID   Continuous Infusions:  sodium chloride 75 mL/hr at 04/15/21 1804   cefTRIAXone (ROCEPHIN)  IV Stopped (04/15/21 0931)   [START ON 04/17/2021] vancomycin     PRN Meds:.acetaminophen, bisacodyl, hydrALAZINE, morphine injection, ondansetron **OR** ondansetron (ZOFRAN) IV, oxyCODONE, polyethylene glycol, senna-docusate Allergies  Allergen Reactions   Other Other (See Comments)    Blood Product Refusal (Jehovah's witness)     Sulfa Antibiotics Rash   Uloric [Febuxostat] Rash    OBJECTIVE: Blood pressure 113/70, pulse 72, temperature 98.5 F (36.9 C), temperature source Oral, resp. rate 15, height 5' 4"  (1.626 m), weight 101.2 kg, SpO2 94 %.  Physical Exam Constitutional:      Appearance: Normal appearance.  HENT:     Head: Normocephalic and atraumatic.     Right Ear: External ear normal.     Left Ear: External ear normal.     Nose: Nose normal.     Mouth/Throat:     Mouth: Mucous membranes are moist.  Eyes:     Extraocular Movements: Extraocular movements intact.     Conjunctiva/sclera: Conjunctivae normal.     Pupils: Pupils are equal, round, and reactive to light.  Cardiovascular:     Rate and Rhythm: Normal rate and regular rhythm.     Heart sounds: No  murmur heard.   No friction rub. No gallop.  Pulmonary:     Effort: Pulmonary effort is normal.     Breath sounds: Normal breath sounds.  Abdominal:     General: Abdomen is flat.     Palpations: Abdomen is soft.  Musculoskeletal:        General: Normal range of motion.     Comments: Right knee wound vac  Skin:    General: Skin is warm and dry.  Neurological:     General: No focal deficit present.  Mental Status: She is alert and oriented to person, place, and time.  Psychiatric:        Mood and Affect: Mood normal.    Lab Results Lab Results  Component Value Date   WBC 10.9 (H) 04/15/2021   HGB 9.6 (L) 04/15/2021   HCT 31.7 (L) 04/15/2021   MCV 69.1 (L) 04/15/2021   PLT 141 (L) 04/15/2021    Lab Results  Component Value Date   CREATININE 1.86 (H) 04/15/2021   BUN 36 (H) 04/15/2021   NA 139 04/15/2021   K 4.5 04/15/2021   CL 102 04/15/2021   CO2 28 04/15/2021    Lab Results  Component Value Date   ALT 22 04/13/2021   AST 25 04/13/2021   ALKPHOS 56 04/13/2021   BILITOT 0.8 04/13/2021       Laurice Record, [facility]ious [facility] 04/15/2021, 7:09 PM

## 2021-04-15 NOTE — Evaluation (Signed)
Physical Therapy Evaluation Patient Details Name: Danielle [name] MRN: [id number] DOB: March 29, 1946 Today's Date: 04/15/2021  History of Present Illness  75 y/o female presented to ED on 11/17 with cellulitis and concern for prosthetic joint infection. S/p fluid aspiration and I&D and polyexchange R TKA on 11/18. PMH: chronic combined systolic and diastolic CHF, CKD stage III, IDDM, HTN, bilateral knee replacement, breast cancer s/p chemo and lumpectomy, OSA on CPAP, hypothyroidism.  Clinical Impression  Patient admitted with above diagnosis. Patient presents with generalized weakness, impaired balance, decreased activity tolerance, and impaired fucntional mobility. Patient requires modA for bed mobility and sit to stand transfer. Able to take steps towards [facility] with minA but heavy reliance on RW. Patient feels she needs rehab before returning home alone. Patient will benefit from skilled PT services during acute stay to address listed deficits. Recommend SNF at discharge to maximize functional mobility and safety.        Recommendations for follow up therapy are one component of a multi-disciplinary discharge planning process, led by the attending physician.  Recommendations may be updated based on patient status, additional functional criteria and insurance authorization.  Follow Up Recommendations Skilled nursing-short term rehab (<3 hours/day)    Assistance Recommended at Discharge Frequent or constant Supervision/Assistance  Functional Status Assessment Patient has had a recent decline in their functional status and/or demonstrates limited ability to make significant improvements in function in a reasonable and predictable amount of time  Equipment Recommendations  [employer] (2 wheels)    Recommendations for Other Services       Precautions / Restrictions Precautions Precautions: Fall Precaution Comments: wound vac, hemovac Restrictions Weight Bearing Restrictions: Yes RLE Weight  Bearing: Weight bearing as tolerated      Mobility  Bed Mobility Overal bed mobility: Needs Assistance Bed Mobility: Supine to Sit;Sit to Supine     Supine to sit: Mod assist Sit to supine: Max assist   General bed mobility comments: modA for R LE management. Able to reposition hips towards EOB with assist    Transfers Overall transfer level: Needs assistance   Transfers: Sit to/from Stand;Bed to chair/wheelchair/BSC Sit to Stand: Mod assist;From elevated surface   Step pivot transfers: Min assist       General transfer comment: modA to power up into standing from elevated surface. Able to take sidesteps towards [facility] with minA for balance and Rw management    Ambulation/Gait                  Stairs            Wheelchair Mobility    Modified Rankin (Stroke Patients Only)       Balance Overall balance assessment: Needs assistance Sitting-balance support: Bilateral upper extremity supported;Feet supported Sitting balance-Leahy Scale: Fair     Standing balance support: Bilateral upper extremity supported Standing balance-Leahy Scale: Poor                               Pertinent Vitals/Pain Pain Assessment: Faces Faces Pain Scale: Hurts even more Pain Location: R knee Pain Descriptors / Indicators: Grimacing;Guarding Pain Intervention(s): Monitored during session;Repositioned;Limited activity within patient's tolerance    Home Living Family/patient expects to be discharged to:: Skilled nursing facility Living Arrangements: Alone                      Prior Function Prior Level of Function : Independent/Modified Independent  Mobility Comments: using public transportation ADLs Comments: "do the best I can"     Hand Dominance        Extremity/Trunk Assessment   Upper Extremity Assessment Upper Extremity Assessment: Generalized weakness    Lower Extremity Assessment Lower Extremity Assessment:  Generalized weakness (difficult to fully assess due to pain - not willing to bend knee)    Cervical / Trunk Assessment Cervical / Trunk Assessment: Kyphotic  Communication   Communication: No difficulties  Cognition Arousal/Alertness: Awake/alert Behavior During Therapy: WFL for tasks assessed/performed Overall Cognitive Status: Within Functional Limits for tasks assessed                                          General Comments      Exercises     Assessment/Plan    PT Assessment Patient needs continued PT services  PT Problem List Decreased strength;Decreased activity tolerance;Decreased balance;Decreased mobility       PT Treatment Interventions DME instruction;Gait training;Functional mobility training;Therapeutic activities;Therapeutic exercise;Balance training;Patient/family education    PT Goals (Current goals can be found in the Care Plan section)  Acute Rehab PT Goals Patient Stated Goal: to get stronger PT Goal Formulation: With patient Time For Goal Achievement: 04/29/21 Potential to Achieve Goals: Fair    Frequency Min 3X/week   Barriers to discharge        Co-evaluation               AM-PAC PT "6 Clicks" Mobility  Outcome Measure Help needed turning from your back to your side while in a flat bed without using bedrails?: A Lot Help needed moving from lying on your back to sitting on the side of a flat bed without using bedrails?: A Lot Help needed moving to and from a bed to a chair (including a wheelchair)?: A Lot Help needed standing up from a chair using your arms (e.g., wheelchair or bedside chair)?: A Lot Help needed to walk in hospital room?: Total Help needed climbing 3-5 steps with a railing? : Total 6 Click Score: 10    End of Session Equipment Utilized During Treatment: Gait belt Activity Tolerance: Patient tolerated treatment well Patient left: in bed;with bed alarm set;with call bell/phone within reach Nurse  Communication: Mobility status PT Visit Diagnosis: Unsteadiness on feet (R26.81);Muscle weakness (generalized) (M62.81);Difficulty in walking, not elsewhere classified (R26.2)    Time: 6962-9528 PT Time Calculation (min) (ACUTE ONLY): 27 min   Charges:   PT Evaluation $PT Eval Moderate Complexity: 1 Mod PT Treatments $Therapeutic Activity: 8-22 mins        Othniel Maret A. Gilford Rile PT, DPT Acute Rehabilitation Services Pager [phone] Office [phone]   Linna Hoff 04/15/2021, 5:08 PM

## 2021-04-15 NOTE — Progress Notes (Signed)
Subjective: 1 Day Post-Op Procedure(s) (LRB): IRRIGATION AND DEBRIDEMENT KNEE WITH POLY EXCHANGE (Right) APPLICATION OF WOUND VAC (Right) Patient reports pain as mild and moderate.    Objective: Vital signs in last 24 hours: Temp:  [98 F (36.7 C)-100 F (37.8 C)] 98.4 F (36.9 C) (11/19 0735) Pulse Rate:  [69-84] 74 (11/19 0735) Resp:  [14-20] 16 (11/19 0735) BP: (105-143)/(52-73) 120/62 (11/19 0735) SpO2:  [97 %-100 %] 100 % (11/19 0735)  Intake/Output from previous day: 11/18 0701 - 11/19 0700 In: 1204.4 [I.V.:1004.4; IV Piggyback:200] Out: 1300 [Urine:1250; Blood:50] Intake/Output this shift: No intake/output data recorded.  Recent Labs    04/13/21 0907 04/14/21 0551 04/15/21 0215  HGB 10.7* 10.8* 9.6*   Recent Labs    04/14/21 0551 04/15/21 0215  WBC 12.1* 10.9*  RBC 5.06 4.59  HCT 34.1* 31.7*  PLT 107* 141*   Recent Labs    04/14/21 0551 04/15/21 0215  NA 141 139  K 5.6* 4.5  CL 102 102  CO2 27 28  BUN 32* 36*  CREATININE 1.72* 1.86*  GLUCOSE 94 175*  CALCIUM 9.3 8.6*   No results for input(s): LABPT, INR in the last 72 hours.  Neurovascular intact Sensation intact distally Dorsiflexion/Plantar flexion intact Incision: dressing C/D/I and incisional vac in place and working properly, no output.   Assessment/Plan: 1 Day Post-Op Procedure(s) (LRB): IRRIGATION AND DEBRIDEMENT KNEE WITH POLY EXCHANGE (Right) APPLICATION OF WOUND VAC (Right) Up with therapy Continue ABX therapy due to Post-op infection  WBAT RLE Likely d/c vac Mon. Pain control as ordered On eliquis  Added stool softner and miralax prn as c/o no BM   Danielle [name] 04/15/2021, 10:00 AM

## 2021-04-15 NOTE — Progress Notes (Signed)
PROGRESS NOTE  Danielle [name]  DOB: April 17, 1946  PCP: Danielle Carol, MD DJM:[id number]  DOA: 04/13/2021  LOS: 2 days  Hospital Day: 3  Chief Complaint  Patient presents with   Knee Pain    R    Brief narrative: Danielle [name] is a 75 y.o. 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 Patient presented to the ED from home by EMS on 11/17 with complaint of right knee pain for last 1 week, progressively worsening leading up to an episode of excruciating pain the previous night. In the ED, patient had a temperature of 100.5. Labs with WC count slightly elevated 12.1, CRP 1.7, creatinine 1.66 Right knee arthrocentesis was done  Patient was started on ceftriaxone 2 g. Admitted to hospitalist service. Orthopedics consulted.  Subjective: Patient was seen and examined this morning. Propped up in bed.  Better mood than yesterday.  Family member on the phone.  Pain controlled.  Assessment/Plan: Septic arthritis of prosthetic right knee -Presented with 1 week history of progressively worsening prosthetic right knee -Seen by orthopedics.   -11/18, patient underwent irrigation and debridement and Foley exchange exchange infected right knee arthroplasty.  Pain controlled.   Acute on chronic combined systolic and diastolic CHF  Essential hypertension -Last echo from December 2021 with EF 45 to 22%, grade 2 diastolic dysfunction, mildly elevated pulmonary artery systolic pressure. -Home meds include Coreg 25 mg twice daily, Imdur 30 mg daily, losartan 50 mg daily, torsemide 40 mg daily, Aldactone 25 mg daily -On admission, patient had bilateral pedal edema.  1 dose of Lasix was given.  Later held because of AKI. -Currently on Coreg.  On hold are Imdur, losartan, torsemide, Aldactone.   Blood pressure stable.  Creatinine slightly up today.  -Net IO Since Admission: -1,545.63 mL [04/15/21 1136] -Continue to monitor for daily intake output, weight, blood pressure, BNP, renal function and electrolytes. Recent Labs  Lab 04/13/21 0907 04/14/21 0551 04/15/21 0215  BUN 34* 32* 36*  CREATININE 1.66* 1.72* 1.86*  K 4.1 5.6* 4.5   AKI on CKD3b -Baseline creatinine less than 1.5.  Creatinine up to 1.86 this morning.  Currently on IV fluid.  On hold Imdur, losartan, torsemide and Aldactone.  Continue to monitor renal function. Recent Labs    04/25/20 1228 04/13/21 0907 04/14/21 0551 04/15/21 0215  BUN 29* 34* 32* 36*  CREATININE 1.45* 1.66* 1.72* 1.86*   Hyperkalemia -Potassium level improved after Lokelma.  Aldactone on hold. Recent Labs  Lab 04/13/21 0907 04/14/21 0551 04/15/21 0215  K 4.1 5.6* 4.5   Paroxysmal A. fib -Sinus rhythm now, continue Coreg for rate control -Eliquis resumed.  Renally dosed per pharmacy  Type 2 diabetes mellitus -A1c 7.5 in 11/17 -Home meds include Semglee 75 units daily, Premeal insulin, Jardiance 10 mg daily, Neurontin 300 mg twice daily, -Currently on Lantus 10 units at bedtime and sliding scale insulin -Blood sugar level seems controlled Recent Labs  Lab 04/14/21 1452 04/14/21 1810 04/14/21 2116 04/15/21 0737 04/15/21 1130  GLUCAP 96 91 120* 145* 193*   Gout -Continue allopurinol  Hypothyroidism -Synthroid  OSA -CPAP nightly  Mobility: PT eval postprocedure Living condition: Lives at home Goals of care:   Code Status: Full Code  Nutritional status: Body mass index is 38.28 kg/m.     Diet:  Diet Order  Diet regular Room service appropriate? Yes; Fluid consistency: Thin  Diet effective now                  DVT prophylaxis: Heparin drip apixaban (ELIQUIS) tablet 2.5 mg Start: 04/15/21 1000 apixaban (ELIQUIS) tablet 5 mg  apixaban (ELIQUIS) tablet 2.5 mg  apixaban (ELIQUIS) tablet 5 mg   Antimicrobials: IV Rocephin and vancomycin.  ID consult called Fluid: Normal saline at 75 mill per hour Consultants: Orthopedics Family Communication: None at bedside  Status is: Inpatient  Remains inpatient appropriate because: Needs surgery  Dispo: The patient is from: Home              Anticipated d/c is to: Needs PT eval postprocedure              Patient currently is not medically stable to d/c.   Difficult to place patient No     Infusions:   sodium chloride 75 mL/hr at 04/15/21 0614   cefTRIAXone (ROCEPHIN)  IV 2 g (04/15/21 0901)   [START ON 04/16/2021] vancomycin     vancomycin      Scheduled Meds:  allopurinol  100 mg Oral Daily   apixaban  2.5 mg Oral BID   apixaban  5 mg Oral BID   [START ON 04/17/2021] apixaban  5 mg Oral BID   atorvastatin  80 mg Oral QHS   carvedilol  25 mg Oral BID WC   Chlorhexidine Gluconate Cloth  6 each Topical Q0600   docusate sodium  100 mg Oral Daily   ferrous sulfate  325 mg Oral Q breakfast   gabapentin  300 mg Oral BID   insulin aspart  0-15 Units Subcutaneous TID WC   insulin glargine-yfgn  10 Units Subcutaneous QHS   latanoprost  1 drop Both Eyes QHS   levothyroxine  75 mcg Oral Q0600   mupirocin ointment  1 application Nasal BID    PRN meds: acetaminophen, bisacodyl, hydrALAZINE, morphine injection, ondansetron **OR** ondansetron (ZOFRAN) IV, oxyCODONE, polyethylene glycol, senna-docusate   Antimicrobials: Anti-infectives (From admission, onward)    Start     Dose/Rate Route Frequency Ordered Stop   04/16/21 1700  vancomycin (VANCOREADY) IVPB 1250 mg/250 mL        1,250 mg 166.7 mL/hr over 90 Minutes Intravenous Every 48 hours 04/14/21 1615     04/15/21 0600  ceFAZolin (ANCEF) IVPB 2g/100 mL premix  Status:  Discontinued        2 g 200 mL/hr over 30 Minutes Intravenous On call to O.R. 04/14/21 1130 04/14/21 1136   04/14/21 1630  vancomycin (VANCOREADY) IVPB 2000 mg/400 mL        2,000 mg 200 mL/hr over 120  Minutes Intravenous  Once 04/14/21 1615     04/14/21 1000  cefTRIAXone (ROCEPHIN) 2 g in sodium chloride 0.9 % 100 mL IVPB        2 g 200 mL/hr over 30 Minutes Intravenous Every 24 hours 04/13/21 1533     04/13/21 1030  cefTRIAXone (ROCEPHIN) 2 g in sodium chloride 0.9 % 100 mL IVPB        2 g 200 mL/hr over 30 Minutes Intravenous  Once 04/13/21 1018 04/13/21 1158       Objective: Vitals:   04/15/21 0437 04/15/21 0735  BP: (!) 136/58 120/62  Pulse: 73 74  Resp: 18 16  Temp: 98.2 F (36.8 C) 98.4 F (36.9 C)  SpO2: 100% 100%    Intake/Output Summary (Last 24 hours) at 04/15/2021 1136 Last  data filed at 04/15/2021 0500 Gross per 24 hour  Intake 1204.37 ml  Output 1300 ml  Net -95.63 ml   Filed Weights   04/13/21 0756  Weight: 101.2 kg   Weight change:  Body mass index is 38.28 kg/m.   Physical Exam: General exam: elderly African-American female, not in pain this morning. Skin: No rashes, lesions or ulcers. HEENT: Atraumatic, normocephalic, no obvious bleeding Lungs: Clear to auscultation bilaterally CVS: Regular rate and rhythm, no murmur GI/Abd soft, nontender, nondistended, bowel sound present CNS: Alert, awake, oriented x3.   Psychiatry: Mood appropriate Extremities: No pedal edema, no calf tenderness.  Right knee bandaged with a Hemovac one.  Data Review: I have personally reviewed the laboratory data and studies available.  F/u labs ordered Unresulted Labs (From admission, onward)     Start     Ordered   04/13/21 1443  Cell count + diff,  w/ cryst-synvl fld  [facility] ED BODY FLUID PANEL)  ONCE - STAT,   STAT        04/13/21 1442   04/13/21 1443  Glucose, Body Fluid Other  ([facility] ED BODY FLUID PANEL)  ONCE - STAT,   STAT        04/13/21 1442            Signed, Terrilee Croak, MD [facility] 04/15/2021

## 2021-04-15 NOTE — Anesthesia Postprocedure Evaluation (Signed)
Anesthesia Post Note  Patient: Danielle [name]  Procedure(s) Performed: IRRIGATION AND DEBRIDEMENT KNEE WITH POLY EXCHANGE (Right: Knee) APPLICATION OF WOUND VAC (Right: Knee)     Patient location during evaluation: PACU Anesthesia Type: General Level of consciousness: awake and alert Pain management: pain level controlled Vital Signs Assessment: post-procedure vital signs reviewed and stable Respiratory status: spontaneous breathing, nonlabored ventilation, respiratory function stable and patient connected to nasal cannula oxygen Cardiovascular status: blood pressure returned to baseline and stable Postop Assessment: no apparent nausea or vomiting Anesthetic complications: no   No notable events documented.          Effie Berkshire

## 2021-04-16 DIAGNOSIS — M009 Pyogenic arthritis, unspecified: Secondary | ICD-10-CM | POA: Diagnosis not present

## 2021-04-16 LAB — CBC WITH DIFFERENTIAL/PLATELET
Abs Immature Granulocytes: 0 10*3/uL (ref 0.00–0.07)
Basophils Absolute: 0.1 10*3/uL (ref 0.0–0.1)
Basophils Relative: 1 %
Eosinophils Absolute: 0 10*3/uL (ref 0.0–0.5)
Eosinophils Relative: 0 %
HCT: 27.1 % — ABNORMAL LOW (ref 36.0–46.0)
Hemoglobin: 8.3 g/dL — ABNORMAL LOW (ref 12.0–15.0)
Lymphocytes Relative: 21 %
Lymphs Abs: 2 10*3/uL (ref 0.7–4.0)
MCH: 21 pg — ABNORMAL LOW (ref 26.0–34.0)
MCHC: 30.6 g/dL (ref 30.0–36.0)
MCV: 68.4 fL — ABNORMAL LOW (ref 80.0–100.0)
Monocytes Absolute: 0.4 10*3/uL (ref 0.1–1.0)
Monocytes Relative: 4 %
Neutro Abs: 7 10*3/uL (ref 1.7–7.7)
Neutrophils Relative %: 74 %
Platelets: 165 10*3/uL (ref 150–400)
RBC: 3.96 MIL/uL (ref 3.87–5.11)
RDW: 15.6 % — ABNORMAL HIGH (ref 11.5–15.5)
WBC: 9.4 10*3/uL (ref 4.0–10.5)
nRBC: 0 /100 WBC
nRBC: 0.2 % (ref 0.0–0.2)

## 2021-04-16 LAB — BASIC METABOLIC PANEL
Anion gap: 3 — ABNORMAL LOW (ref 5–15)
BUN: 40 mg/dL — ABNORMAL HIGH (ref 8–23)
CO2: 27 mmol/L (ref 22–32)
Calcium: 8 mg/dL — ABNORMAL LOW (ref 8.9–10.3)
Chloride: 105 mmol/L (ref 98–111)
Creatinine, Ser: 1.68 mg/dL — ABNORMAL HIGH (ref 0.44–1.00)
GFR, Estimated: 32 mL/min — ABNORMAL LOW (ref >60)
Glucose, Bld: 167 mg/dL — ABNORMAL HIGH (ref 70–99)
Potassium: 3.8 mmol/L (ref 3.5–5.1)
Sodium: 135 mmol/L (ref 135–145)

## 2021-04-16 LAB — GLUCOSE, CAPILLARY
Glucose-Capillary: 150 mg/dL — ABNORMAL HIGH (ref 70–99)
Glucose-Capillary: 201 mg/dL — ABNORMAL HIGH (ref 70–99)
Glucose-Capillary: 146 mg/dL — ABNORMAL HIGH (ref 70–99)
Glucose-Capillary: 175 mg/dL — ABNORMAL HIGH (ref 70–99)

## 2021-04-16 MED ORDER — INSULIN GLARGINE-YFGN 100 UNIT/ML ~~LOC~~ SOLN
20.0000 [IU] | Freq: Every day | SUBCUTANEOUS | Status: DC
  Administered 2021-04-16: 20 [IU] via SUBCUTANEOUS
  Filled 2021-04-16 (×2): qty 0.2

## 2021-04-16 NOTE — TOC Initial Note (Addendum)
Transition of Care [facility]) - Initial/Assessment Note    Patient Details  Name: Danielle [name] MRN: [id number] Date of Birth: [birth year]  Transition of Care [facility]) CM/SW Contact:    Bary Castilla, LCSW Phone Number:336 [phone] 04/16/2021, 2:33 PM  Clinical Narrative:                  CSW met with patient to discuss PT recommendation of a SNF. Patient was aware of recommendation and in agreement with going to a ST SNF. CSW discussed the SNF process.CSW provided patient with medicare.gov rating list.  Patient gave CSW permission to fax referrals out to local facilities in primary [place]. CSW answered questions about the SNF process and the next steps in the process. Pt expressed her concerns with her current Medicare agency. CSW informed pt that there are fewer facilities that contract with [employer].  Pt has been vaccinated and has had at least one booster.   TOC team will continue to assist with discharge planning needs.           Patient Goals and CMS Choice        Expected Discharge Plan and Services                                                Prior Living Arrangements/Services                       Activities of Daily Living Home Assistive Devices/Equipment: Cane (specify quad or straight) (Quad) ADL Screening (condition at time of admission) Patient's cognitive ability adequate to safely complete daily activities?: Yes Is the patient deaf or have difficulty hearing?: No Does the patient have difficulty seeing, even when wearing glasses/contacts?: No Does the patient have difficulty concentrating, remembering, or making decisions?: No Patient able to express need for assistance with ADLs?: Yes Does the patient have difficulty dressing or bathing?: No Independently performs ADLs?: Yes (appropriate for developmental age) Does the patient have difficulty walking or climbing stairs?: Yes Weakness of Legs: Right Weakness of Arms/Hands:  Left  Permission Sought/Granted                  Emotional Assessment              Admission diagnosis:  Septic arthritis ([place]) [B93.9] Acute systolic CHF (congestive heart failure) ([place]) [I50.21] Patient Active Problem List   Diagnosis Date Noted   Septic arthritis ([place]) 04/13/2021   Polyneuropathy associated with underlying disease ([place]) 05/04/2020   Severe nonproliferative diabetic retinopathy of left eye, with macular edema, associated with type 2 diabetes mellitus ([place]) 11/05/2019   Severe nonproliferative diabetic retinopathy of right eye, with macular edema, associated with type 2 diabetes mellitus ([place]) 11/05/2019   Retinal hemorrhage of right eye 11/05/2019   Retinal hemorrhage of left eye 11/05/2019   Type 2 diabetes mellitus with hyperglycemia, with long-term current use of insulin ([place]) 05/14/2019   Diabetes mellitus ([place]) 05/14/2019   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, with long-term current use of insulin ([place]) 05/14/2019   Type 2 diabetes mellitus with diabetic polyneuropathy, with long-term current use of insulin ([place]) 05/14/2019   Encephalopathy acute 05/20/2018   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 05/20/2018   Acute kidney failure ([place]) 05/15/2018   Primary localized osteoarthritis of right knee 05/13/2018   (  HFpEF) heart failure with preserved ejection fraction ([place]) 12/20/2017   Obesity (BMI 30-39.9) 11/11/2017   OSA (obstructive sleep apnea) 07/05/9098   Acute systolic CHF (congestive heart failure) ([place]) 02/03/2017   Essential hypertension    Diabetes mellitus without complication ([place])    Cancer ([place])    Thyroid disease    PCP:  Seward Carol, MD Pharmacy:   [facility] [place] - [address] [place] [address] Phone: [phone] Fax: [place], [place]  [address] Phone: [phone] Fax: [phone]  [facility] [address] [place] [address] Phone: [phone] Fax: [phone]  [place], [place] [address] [place] [address] Phone: [phone] Fax: [phone]  [facility] [place] (New Address) - [place], [place] AT Previously: [place], [place] [place] [address] Phone: [phone] Fax: [phone]     Social Determinants of Health ([place]) Interventions    Readmission Risk Interventions No flowsheet data found.

## 2021-04-16 NOTE — Plan of Care (Signed)

## 2021-04-16 NOTE — NC FL2 (Signed)
[place] MEDICAID FL2 LEVEL OF CARE SCREENING TOOL     IDENTIFICATION  Patient Name: Lauralee Waters Birthdate: 03/24/1946 Sex: female Admission Date (Current Location): 04/13/2021  [employer] and [place] Number:  Herbalist and Address:  [facility] [place], [place] [address], [place], [place] [address]      Provider Number: [id number]  Attending Physician Name and Address:  Terrilee Croak, MD  Relative Name and Phone Number:  Varney Biles [phone]    Current Level of Care: Hospital Recommended Level of Care: [place] Prior Approval Number:    Date Approved/Denied:   PASRR Number: Will need to verify PASRR #  Discharge Plan: SNF    Current Diagnoses: Patient Active Problem List   Diagnosis Date Noted   Septic arthritis ([place]) 04/13/2021   Polyneuropathy associated with underlying disease ([place]) 05/04/2020   Severe nonproliferative diabetic retinopathy of left eye, with macular edema, associated with type 2 diabetes mellitus ([place]) 11/05/2019   Severe nonproliferative diabetic retinopathy of right eye, with macular edema, associated with type 2 diabetes mellitus ([place]) 11/05/2019   Retinal hemorrhage of right eye 11/05/2019   Retinal hemorrhage of left eye 11/05/2019   Type 2 diabetes mellitus with hyperglycemia, with long-term current use of insulin ([place]) 05/14/2019   Diabetes mellitus ([place]) 05/14/2019   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, with long-term current use of insulin ([place]) 05/14/2019   Type 2 diabetes mellitus with diabetic polyneuropathy, with long-term current use of insulin ([place]) 05/14/2019   Encephalopathy acute 05/20/2018   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 05/20/2018   Acute kidney failure ([place]) 05/15/2018   Primary localized osteoarthritis of right knee 05/13/2018   (HFpEF) heart failure with preserved ejection fraction ([place]) 12/20/2017   Obesity (BMI 30-39.9) 11/11/2017   OSA  (obstructive sleep apnea) 65/99/3570   Acute systolic CHF (congestive heart failure) ([place]) 02/03/2017   Essential hypertension    Diabetes mellitus without complication (HCC)    Cancer (HCC)    Thyroid disease     Orientation RESPIRATION BLADDER Height & Weight     Self, Time, Situation, Place  O2 (2L) Incontinent, External catheter Weight: 223 lb (101.2 kg) Height:  5\' 4"  (162.6 cm)  BEHAVIORAL SYMPTOMS/MOOD NEUROLOGICAL BOWEL NUTRITION STATUS      Continent Diet (See DC summary)  AMBULATORY STATUS COMMUNICATION OF NEEDS Skin   Limited Assist Verbally Surgical wounds (right Knee)                       Personal Care Assistance Level of Assistance  Bathing, Feeding, Dressing Bathing Assistance: Limited assistance Feeding assistance: Independent Dressing Assistance: Limited assistance     Functional Limitations Info  Sight, Hearing, Speech Sight Info: Impaired Hearing Info: Adequate Speech Info: Adequate    SPECIAL CARE FACTORS FREQUENCY  PT (By licensed PT), OT (By licensed OT)     PT Frequency: 5x per week OT Frequency: 5x per week            Contractures Contractures Info: Not present    Additional Factors Info  Code Status, Allergies, Insulin Sliding Scale Code Status Info: Full Allergies Info: Other, Sulfa Antibiotics, Uloric (Febuxostat)   Insulin Sliding Scale Info: See Discharge Summary       Current Medications (04/16/2021):  This is the current hospital active medication list Current Facility-Administered Medications  Medication Dose Route Frequency Provider Last Rate Last Admin   0.9 %  sodium chloride infusion  Intravenous Continuous Willaim Sheng, MD 75 mL/hr at 04/15/21 2128 New Bag at 04/15/21 2128   acetaminophen (TYLENOL) tablet 650 mg  650 mg Oral Q6H PRN Willaim Sheng, MD   650 mg at 04/15/21 2128   allopurinol (ZYLOPRIM) tablet 100 mg  100 mg Oral Daily Willaim Sheng, MD   100 mg at 04/16/21 0845   apixaban  (ELIQUIS) tablet 2.5 mg  2.5 mg Oral BID Willaim Sheng, MD   2.5 mg at 04/16/21 0845   [START ON 04/17/2021] apixaban (ELIQUIS) tablet 5 mg  5 mg Oral BID Willaim Sheng, MD       atorvastatin (LIPITOR) tablet 80 mg  80 mg Oral QHS Willaim Sheng, MD   80 mg at 04/15/21 2128   bisacodyl (DULCOLAX) EC tablet 5 mg  5 mg Oral Daily PRN Willaim Sheng, MD       carvedilol (COREG) tablet 25 mg  25 mg Oral BID WC Willaim Sheng, MD   25 mg at 04/16/21 0846   cefTRIAXone (ROCEPHIN) 2 g in sodium chloride 0.9 % 100 mL IVPB  2 g Intravenous Q24H Willaim Sheng, MD 200 mL/hr at 04/16/21 0849 2 g at 04/16/21 0849   Chlorhexidine Gluconate Cloth 2 % PADS 6 each  6 each Topical Q0600 Willaim Sheng, MD   6 each at 04/16/21 [phone]   docusate sodium (COLACE) capsule 100 mg  100 mg Oral Daily Chadwell, Vonna Kotyk, PA-C   100 mg at 04/16/21 1751   ferrous sulfate tablet 325 mg  325 mg Oral Q breakfast Willaim Sheng, MD   325 mg at 04/16/21 0846   gabapentin (NEURONTIN) capsule 300 mg  300 mg Oral BID Willaim Sheng, MD   300 mg at 04/16/21 0846   hydrALAZINE (APRESOLINE) tablet 25 mg  25 mg Oral Q6H PRN Willaim Sheng, MD       insulin aspart (novoLOG) injection 0-15 Units  0-15 Units Subcutaneous TID WC Willaim Sheng, MD   5 Units at 04/16/21 1135   insulin glargine-yfgn (SEMGLEE) injection 20 Units  20 Units Subcutaneous QHS Dahal, Binaya, MD       latanoprost (XALATAN) 0.005 % ophthalmic solution 1 drop  1 drop Both Eyes QHS Willaim Sheng, MD   1 drop at 04/15/21 2133   levothyroxine (SYNTHROID) tablet 75 mcg  75 mcg Oral Q0600 Willaim Sheng, MD   75 mcg at 04/16/21 0258   morphine 2 MG/ML injection 2 mg  2 mg Intravenous Q4H PRN Willaim Sheng, MD   2 mg at 04/15/21 1423   mupirocin ointment (BACTROBAN) 2 % 1 application  1 application Nasal BID Willaim Sheng, MD   1 application at 52/77/82 0850   ondansetron (ZOFRAN)  tablet 4 mg  4 mg Oral Q6H PRN Willaim Sheng, MD       Or   ondansetron [facility]) injection 4 mg  4 mg Intravenous Q6H PRN Willaim Sheng, MD       oxyCODONE (Oxy IR/ROXICODONE) immediate release tablet 5 mg  5 mg Oral Q4H PRN Willaim Sheng, MD   5 mg at 04/16/21 1144   polyethylene glycol (MIRALAX / GLYCOLAX) packet 17 g  17 g Oral Daily PRN Chadwell, Joshua, PA-C       senna-docusate (Senokot-S) tablet 1 tablet  1 tablet Oral QHS PRN Willaim Sheng, MD       [START ON 04/17/2021] vancomycin (VANCOREADY) IVPB 1250  mg/250 mL  1,250 mg Intravenous Q48H Pham, Minh Q, RPH-CPP         Discharge Medications: Please see discharge summary for a list of discharge medications.  Relevant Imaging Results:  Relevant Lab Results:   Additional Information SSN# [id number] Pt vaccinated and had one booster  Bary Castilla, LCSW

## 2021-04-16 NOTE — Progress Notes (Signed)
Subjective: 2 Days Post-Op Procedure(s) (LRB): IRRIGATION AND DEBRIDEMENT KNEE WITH POLY EXCHANGE (Right) APPLICATION OF WOUND VAC (Right) Patient reports pain as mild and moderate.    Objective: Vital signs in last 24 hours: Temp:  [98 F (36.7 C)-99 F (37.2 C)] 98.3 F (36.8 C) (11/20 0830) Pulse Rate:  [66-76] 70 (11/20 0830) Resp:  [15-20] 16 (11/20 0830) BP: (103-152)/(53-83) 123/83 (11/20 0830) SpO2:  [94 %-98 %] 98 % (11/20 0830)  Intake/Output from previous day: 11/19 0701 - 11/20 0700 In: 1783.8 [P.O.:720; I.V.:663.8; IV Piggyback:400] Out: -  Intake/Output this shift: Total I/O In: 240 [P.O.:240] Out: -   Recent Labs    04/14/21 0551 04/15/21 0215 04/16/21 0758  HGB 10.8* 9.6* 8.3*   Recent Labs    04/15/21 0215 04/16/21 0758  WBC 10.9* 9.4  RBC 4.59 3.96  HCT 31.7* 27.1*  PLT 141* 165   Recent Labs    04/15/21 0215 04/16/21 0758  NA 139 135  K 4.5 3.8  CL 102 105  CO2 28 27  BUN 36* 40*  CREATININE 1.86* 1.68*  GLUCOSE 175* 167*  CALCIUM 8.6* 8.0*   No results for input(s): LABPT, INR in the last 72 hours.  Neurovascular intact Sensation intact distally Dorsiflexion/Plantar flexion intact Incision: dressing C/D/I   Assessment/Plan: 2 Days Post-Op Procedure(s) (LRB): IRRIGATION AND DEBRIDEMENT KNEE WITH POLY EXCHANGE (Right) APPLICATION OF WOUND VAC (Right)  Up with therapy Continue ABX therapy due to Post-op infection   WBAT RLE Likely d/c vac Mon. Pain control as ordered On eliquis    Chriss Czar 04/16/2021, 9:54 AM

## 2021-04-16 NOTE — Progress Notes (Signed)
     Subjective:  Patient doing well this morning.  Pain well controlled, better than before surgery.  Aspiration and Intra-Op cultures positive for staph.  Sensitivities pending continuing on vancomycin and ceftriaxone currently.  Denies distal numbness and tingling.  Hemovac with minimal serosanguineous output.  Incisional VAC with no output.  Objective:   VITALS:   Vitals:   04/15/21 1452 04/15/21 2025 04/16/21 0444 04/16/21 0830  BP: 113/70 122/61 (!) 152/65 123/83  Pulse: 72 76 66 70  Resp: 15  20 16   Temp: 98.5 F (36.9 C) 99 F (37.2 C) 98.5 F (36.9 C) 98.3 F (36.8 C)  TempSrc: Oral Oral Oral Oral  SpO2: 94% 94% 95% 98%  Weight:      Height:        Sensation intact distally Intact pulses distally Dorsiflexion/Plantar flexion intact Incision: dressing C/D/I Compartment soft   Lab Results  Component Value Date   WBC 9.4 04/16/2021   HGB 8.3 (L) 04/16/2021   HCT 27.1 (L) 04/16/2021   MCV 68.4 (L) 04/16/2021   PLT 165 04/16/2021   BMET    Component Value Date/Time   NA 135 04/16/2021 0758   K 3.8 04/16/2021 0758   CL 105 04/16/2021 0758   CO2 27 04/16/2021 0758   GLUCOSE 167 (H) 04/16/2021 0758   BUN 40 (H) 04/16/2021 0758   CREATININE 1.68 (H) 04/16/2021 0758   CALCIUM 8.0 (L) 04/16/2021 0758   GFRNONAA 32 (L) 04/16/2021 0758     Assessment/Plan: 2 Days Post-Op   Principal Problem:   Septic arthritis (HCC) Active Problems:   Acute systolic CHF (congestive heart failure) (HCC)   Post op recs: WB: WBAT Abx: Broad-spectrum antibiotics postoperative.  Patient will receive vancomycin and ceftriaxone pending aspiration and Intra-Op cultures.  Plan for ID consult.  Anticipate likely 6 weeks of IV antibiotics. Imaging: PACU xrays DVT prophylaxis: Resume Eliquis 2.5 twice daily starting postop day 1-2.  Then return to full anticoagulation dose 5 mg Eliquis twice daily starting postop day 3 Dressing: Prevena VAC dressing to be changed on postop day 7  to dry dressing. Drain: Anticipate drain removal removal after postop day 3 assuming output less than 30 cc per shift. Follow up: 2 weeks after surgery for a wound check with Dr. Zachery Dakins at [facility].  Address: [address] [place], [place], [place] [address]  Office Phone: [phone]    Carnesha Maravilla A [place] 04/16/2021, 11:41 AM   Charlies Constable, MD Cell [phone]  Contact information:   [id number] 7am-5pm epic message Dr. Zachery Dakins, or call office for patient follow up: [phone] After hours and holidays please check [email or web address] for group call information for [facility]

## 2021-04-16 NOTE — Progress Notes (Signed)
PROGRESS NOTE  Danielle [name]  DOB: 01/27/1946  PCP: [place] Carol, MD PZW:[id number]  DOA: 04/13/2021  LOS: 3 days  Hospital Day: 4  Chief Complaint  Patient presents with   Knee Pain    R    Brief narrative: Danielle [name] is a 75 y.o. 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 Patient presented to the ED from home by EMS on 11/17 with complaint of right knee pain for last 1 week, progressively worsening leading up to an episode of excruciating pain the previous night. In the ED, patient had a temperature of 100.5. Labs with WC count slightly elevated 12.1, CRP 1.7, creatinine 1.66 Right knee arthrocentesis was done  Patient was started on ceftriaxone 2 g. Admitted to hospitalist service. Orthopedics consulted.  Subjective: Patient was seen and examined this morning. Reports mild to moderate pain in the right knee today.  No other complaint.  Assessment/Plan: Septic arthritis of prosthetic right knee -Presented with 1 week history of progressively worsening prosthetic right knee -Seen by orthopedics.   -11/18, patient underwent irrigation and debridement and Foley exchange exchange infected right knee arthroplasty.  Pain management per orthopedics. -IV antibiotics per ID   Acute on chronic combined systolic and diastolic CHF  Essential hypertension -Last echo from December 2021 with EF 45 to 42%, grade 2 diastolic dysfunction, mildly elevated pulmonary artery systolic pressure. -Home meds include Coreg 25 mg twice daily, Imdur 30 mg daily, losartan 50 mg daily, torsemide 40 mg daily, Aldactone 25 mg daily -On admission, patient had bilateral pedal edema.  1 dose of Lasix was given.  Later held because of AKI. -Currently on Coreg.  On hold are Imdur, losartan,  torsemide, Aldactone.  Blood pressure stable.  Creatinine stable at baseline today.  Breathing on room air. -Net IO Since Admission: 478.2 mL [04/16/21 1135] -Continue to monitor for daily intake output, weight, blood pressure, BNP, renal function and electrolytes. Recent Labs  Lab 04/13/21 0907 04/14/21 0551 04/15/21 0215 04/16/21 0758  BUN 34* 32* 36* 40*  CREATININE 1.66* 1.72* 1.86* 1.68*  K 4.1 5.6* 4.5 3.8   AKI on CKD3b -Baseline creatinine less than 1.5.  Creatinine peaked at 1.86 on 11/19.  Better today at 1.68.  On hold Imdur, losartan, torsemide and Aldactone.  Continue to monitor renal function. Recent Labs    04/25/20 1228 04/13/21 0907 04/14/21 0551 04/15/21 0215 04/16/21 0758  BUN 29* 34* 32* 36* 40*  CREATININE 1.45* 1.66* 1.72* 1.86* 1.68*   Hyperkalemia -Potassium level improved after Lokelma.  Aldactone on hold. Recent Labs  Lab 04/13/21 0907 04/14/21 0551 04/15/21 0215 04/16/21 0758  K 4.1 5.6* 4.5 3.8   Paroxysmal A. fib -Sinus rhythm now, continue Coreg for rate control -Eliquis resumed.  Renally dosed per pharmacy  Type 2 diabetes mellitus -A1c 7.5 in 11/17 -Home meds include Semglee 75 units daily, Premeal insulin, Jardiance 10 mg daily,  -Currently on Semglee 10 units at night. -Blood sugar level trending up. -We will increase Semglee to 20 units for tonight.  Continue sliding scale insulin.   -Continue Neurontin Recent Labs  Lab 04/15/21 1130 04/15/21 1557 04/15/21 2112 04/16/21 0826 04/16/21 1125  GLUCAP 193* 188* 194* 150* 201*   Gout -Continue allopurinol  Hypothyroidism -Synthroid  OSA -CPAP nightly  Mobility: PT eval postprocedure Living condition: Lives at home Goals  of care:   Code Status: Full Code  Nutritional status: Body mass index is 38.28 kg/m.     Diet:  Diet Order             Diet regular Room service appropriate? Yes; Fluid consistency: Thin  Diet effective now                  DVT  prophylaxis: Heparin drip apixaban (ELIQUIS) tablet 2.5 mg Start: 04/15/21 1000 apixaban (ELIQUIS) tablet 2.5 mg  apixaban (ELIQUIS) tablet 5 mg  Antimicrobials: IV Rocephin and vancomycin.  ID consult appreciated Fluid: Normal saline at 75 mill per hour Consultants: Orthopedics Family Communication: None at bedside  Status is: Inpatient  Remains inpatient appropriate because: POD 2, has wound VAC on.  Pending culture report.  On broad-spectrum antibiotics  Dispo: The patient is from: Home              Anticipated d/c is to: PT recommended SNF              Patient currently is not medically stable to d/c.   Difficult to place patient No     Infusions:   sodium chloride 75 mL/hr at 04/15/21 2128   cefTRIAXone (ROCEPHIN)  IV 2 g (04/16/21 0849)   [START ON 04/17/2021] vancomycin      Scheduled Meds:  allopurinol  100 mg Oral Daily   apixaban  2.5 mg Oral BID   [START ON 04/17/2021] apixaban  5 mg Oral BID   atorvastatin  80 mg Oral QHS   carvedilol  25 mg Oral BID WC   Chlorhexidine Gluconate Cloth  6 each Topical Q0600   docusate sodium  100 mg Oral Daily   ferrous sulfate  325 mg Oral Q breakfast   gabapentin  300 mg Oral BID   insulin aspart  0-15 Units Subcutaneous TID WC   insulin glargine-yfgn  20 Units Subcutaneous QHS   latanoprost  1 drop Both Eyes QHS   levothyroxine  75 mcg Oral Q0600   mupirocin ointment  1 application Nasal BID    PRN meds: acetaminophen, bisacodyl, hydrALAZINE, morphine injection, ondansetron **OR** ondansetron (ZOFRAN) IV, oxyCODONE, polyethylene glycol, senna-docusate   Antimicrobials: Anti-infectives (From admission, onward)    Start     Dose/Rate Route Frequency Ordered Stop   04/17/21 1200  vancomycin (VANCOREADY) IVPB 1250 mg/250 mL        1,250 mg 166.7 mL/hr over 90 Minutes Intravenous Every 48 hours 04/15/21 1424     04/16/21 1700  vancomycin (VANCOREADY) IVPB 1250 mg/250 mL  Status:  Discontinued        1,250 mg 166.7  mL/hr over 90 Minutes Intravenous Every 48 hours 04/14/21 1615 04/15/21 1424   04/15/21 1515  vancomycin (VANCOREADY) IVPB 1500 mg/300 mL        1,500 mg 150 mL/hr over 120 Minutes Intravenous  Once 04/15/21 1424 04/15/21 1657   04/15/21 0600  ceFAZolin (ANCEF) IVPB 2g/100 mL premix  Status:  Discontinued        2 g 200 mL/hr over 30 Minutes Intravenous On call to O.R. 04/14/21 1130 04/14/21 1136   04/14/21 1630  vancomycin (VANCOREADY) IVPB 2000 mg/400 mL  Status:  Discontinued        2,000 mg 200 mL/hr over 120 Minutes Intravenous  Once 04/14/21 1615 04/15/21 1424   04/14/21 1000  cefTRIAXone (ROCEPHIN) 2 g in sodium chloride 0.9 % 100 mL IVPB        2 g 200 mL/hr  over 30 Minutes Intravenous Every 24 hours 04/13/21 1533     04/13/21 1030  cefTRIAXone (ROCEPHIN) 2 g in sodium chloride 0.9 % 100 mL IVPB        2 g 200 mL/hr over 30 Minutes Intravenous  Once 04/13/21 1018 04/13/21 1158       Objective: Vitals:   04/16/21 0444 04/16/21 0830  BP: (!) 152/65 123/83  Pulse: 66 70  Resp: 20 16  Temp: 98.5 F (36.9 C) 98.3 F (36.8 C)  SpO2: 95% 98%    Intake/Output Summary (Last 24 hours) at 04/16/2021 1135 Last data filed at 04/16/2021 0852 Gross per 24 hour  Intake 1783.83 ml  Output --  Net 1783.83 ml   Filed Weights   04/13/21 0756  Weight: 101.2 kg   Weight change:  Body mass index is 38.28 kg/m.   Physical Exam: General exam: elderly African-American female, mild to moderate right knee pain today Skin: No rashes, lesions or ulcers. HEENT: Atraumatic, normocephalic, no obvious bleeding Lungs: Clear to auscultation bilaterally CVS: Regular rate and rhythm, no murmur GI/Abd soft, nontender, nondistended, bowel sound present CNS: Alert, awake, oriented x3 Psychiatry: Mood appropriate Extremities: No pedal edema, no calf tenderness.  Right knee bandaged with a Hemovac one.  Data Review: I have personally reviewed the laboratory data and studies available.  F/u  labs ordered Unresulted Labs (From admission, onward)     Start     Ordered   04/17/21 0500  CBC with Differential/Platelet  Daily,   R      04/16/21 0742   04/17/21 7096  Basic metabolic panel  Daily,   R      04/16/21 0742   04/13/21 1443  Cell count + diff,  w/ cryst-synvl fld  [facility] ED BODY FLUID PANEL)  ONCE - STAT,   STAT        04/13/21 1442   04/13/21 1443  Glucose, Body Fluid Other  ([facility] ED BODY FLUID PANEL)  ONCE - STAT,   STAT        04/13/21 1442            Signed, Terrilee Croak, MD [facility] 04/16/2021

## 2021-04-17 ENCOUNTER — Encounter (HOSPITAL_COMMUNITY): Payer: Self-pay | Admitting: Orthopedic Surgery

## 2021-04-17 DIAGNOSIS — M009 Pyogenic arthritis, unspecified: Secondary | ICD-10-CM | POA: Diagnosis not present

## 2021-04-17 LAB — CBC WITH DIFFERENTIAL/PLATELET
Abs Immature Granulocytes: 0.04 10*3/uL (ref 0.00–0.07)
Basophils Absolute: 0 10*3/uL (ref 0.0–0.1)
Basophils Relative: 0 %
Eosinophils Absolute: 0.1 10*3/uL (ref 0.0–0.5)
Eosinophils Relative: 1 %
HCT: 27.2 % — ABNORMAL LOW (ref 36.0–46.0)
Hemoglobin: 8.2 g/dL — ABNORMAL LOW (ref 12.0–15.0)
Immature Granulocytes: 1 %
Lymphocytes Relative: 26 %
Lymphs Abs: 2.2 10*3/uL (ref 0.7–4.0)
MCH: 20.9 pg — ABNORMAL LOW (ref 26.0–34.0)
MCHC: 30.1 g/dL (ref 30.0–36.0)
MCV: 69.4 fL — ABNORMAL LOW (ref 80.0–100.0)
Monocytes Absolute: 0.7 10*3/uL (ref 0.1–1.0)
Monocytes Relative: 8 %
Neutro Abs: 5.3 10*3/uL (ref 1.7–7.7)
Neutrophils Relative %: 64 %
Platelets: 171 10*3/uL (ref 150–400)
RBC: 3.92 MIL/uL (ref 3.87–5.11)
RDW: 15.9 % — ABNORMAL HIGH (ref 11.5–15.5)
WBC: 8.3 10*3/uL (ref 4.0–10.5)
nRBC: 0 % (ref 0.0–0.2)

## 2021-04-17 LAB — GLUCOSE, CAPILLARY
Glucose-Capillary: 120 mg/dL — ABNORMAL HIGH (ref 70–99)
Glucose-Capillary: 160 mg/dL — ABNORMAL HIGH (ref 70–99)
Glucose-Capillary: 183 mg/dL — ABNORMAL HIGH (ref 70–99)
Glucose-Capillary: 189 mg/dL — ABNORMAL HIGH (ref 70–99)

## 2021-04-17 LAB — BASIC METABOLIC PANEL
Anion gap: 8 (ref 5–15)
BUN: 42 mg/dL — ABNORMAL HIGH (ref 8–23)
CO2: 24 mmol/L (ref 22–32)
Calcium: 8 mg/dL — ABNORMAL LOW (ref 8.9–10.3)
Chloride: 106 mmol/L (ref 98–111)
Creatinine, Ser: 1.46 mg/dL — ABNORMAL HIGH (ref 0.44–1.00)
GFR, Estimated: 38 mL/min — ABNORMAL LOW (ref >60)
Glucose, Bld: 138 mg/dL — ABNORMAL HIGH (ref 70–99)
Potassium: 4.5 mmol/L (ref 3.5–5.1)
Sodium: 138 mmol/L (ref 135–145)

## 2021-04-17 MED ORDER — ISOSORBIDE MONONITRATE ER 30 MG PO TB24
30.0000 mg | ORAL_TABLET | Freq: Every day | ORAL | Status: DC
  Administered 2021-04-17 – 2021-04-24 (×8): 30 mg via ORAL
  Filled 2021-04-17 (×9): qty 1

## 2021-04-17 MED ORDER — CEFAZOLIN SODIUM-DEXTROSE 2-4 GM/100ML-% IV SOLN
2.0000 g | Freq: Three times a day (TID) | INTRAVENOUS | Status: DC
  Administered 2021-04-18 – 2021-04-24 (×20): 2 g via INTRAVENOUS
  Filled 2021-04-17 (×20): qty 100

## 2021-04-17 MED ORDER — INSULIN GLARGINE-YFGN 100 UNIT/ML ~~LOC~~ SOLN
30.0000 [IU] | Freq: Every day | SUBCUTANEOUS | Status: DC
  Administered 2021-04-17 – 2021-04-23 (×7): 30 [IU] via SUBCUTANEOUS
  Filled 2021-04-17 (×8): qty 0.3

## 2021-04-17 NOTE — Care Management Important Message (Signed)
Important Message  Patient Details  Name: Danielle [name] MRN: [id number] Date of Birth: 05-Jan-1946   Medicare Important Message Given:  Yes     Joetta Manners 04/17/2021, 1:37 PM

## 2021-04-17 NOTE — Progress Notes (Signed)
PROGRESS NOTE  Danielle [name]  DOB: Aug 17, 1945  PCP: Seward Carol, MD GOV:[id number]  DOA: 04/13/2021  LOS: 4 days  Hospital Day: 5  Chief Complaint  Patient presents with   Knee Pain    R    Brief narrative: Danielle [name] is a 75 y.o. 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 Patient presented to the ED from home by EMS on 11/17 with complaint of right knee pain for last 1 week, progressively worsening leading up to an episode of excruciating pain the previous night. In the ED, patient had a temperature of 100.5. Labs with WC count slightly elevated 12.1, CRP 1.7, creatinine 1.66 Right knee arthrocentesis was done  Patient was started on ceftriaxone 2 g. Admitted to hospitalist service. Orthopedics consulted.  Subjective: Patient was seen and examined this morning. Continues to have mild to moderate pain in the right knee.  Assessment/Plan: Septic arthritis of prosthetic right knee -Presented with 1 week history of progressively worsening prosthetic right knee -Seen by orthopedics.   -11/18, patient underwent irrigation and debridement and polyexchange of infected right knee arthroplasty. Pain management per orthopedics. -IV antibiotics per ID.   Acute on chronic combined systolic and diastolic CHF  Essential hypertension -Last echo from December 2021 with EF 45 to 81%, grade 2 diastolic dysfunction, mildly elevated pulmonary artery systolic pressure. -Home meds include Coreg 25 mg twice daily, Imdur 30 mg daily, losartan 50 mg daily, torsemide 40 mg daily, Aldactone 25 mg daily -On admission, patient had bilateral pedal edema.  1 dose of Lasix was given.  Later held because of AKI. -Currently on Coreg.  On hold are Imdur, losartan, torsemide, Aldactone.  Blood  pressure rising up.  I will resume Imdur today.  Respiratory status stable. -Net IO Since Admission: -1,551.8 mL [04/17/21 1310] -Continue to monitor for daily intake output, weight, blood pressure, BNP, renal function and electrolytes. Recent Labs  Lab 04/13/21 0907 04/14/21 0551 04/15/21 0215 04/16/21 0758 04/17/21 0343  BUN 34* 32* 36* 40* 42*  CREATININE 1.66* 1.72* 1.86* 1.68* 1.46*  K 4.1 5.6* 4.5 3.8 4.5   AKI on CKD3b -Baseline creatinine less than 1.5.  Creatinine peaked at 1.86 on 11/19.  Gradually improving, 1.46 today.  Continue to hold losartan, torsemide and Aldactone.  Continue to monitor renal function. Recent Labs    04/25/20 1228 04/13/21 0907 04/14/21 0551 04/15/21 0215 04/16/21 0758 04/17/21 0343  BUN 29* 34* 32* 36* 40* 42*  CREATININE 1.45* 1.66* 1.72* 1.86* 1.68* 1.46*   Hyperkalemia -Potassium level improved after Lokelma.  Aldactone on hold. Recent Labs  Lab 04/13/21 0907 04/14/21 0551 04/15/21 0215 04/16/21 0758 04/17/21 0343  K 4.1 5.6* 4.5 3.8 4.5   Paroxysmal A. fib -Sinus rhythm now, continue Coreg for rate control -Eliquis resumed.  Renally dosed per pharmacy  Type 2 diabetes mellitus -A1c 7.5 in 11/17 -Home meds include Semglee 75 units daily, Premeal insulin, Jardiance 10 mg daily,  -Currently on Semglee 20 units at night. -Blood sugar level improving but is still elevated 220 this morning.  -We will increase Semglee to 30 units for tonight.  Continue sliding scale insulin.   -Continue Neurontin Recent Labs  Lab 04/16/21 1125 04/16/21 1623 04/16/21 2105 04/17/21 0734 04/17/21 1157  GLUCAP 201* 146* 175* 120* 160*   Gout -Continue allopurinol  Hypothyroidism -Synthroid  OSA -CPAP nightly  Mobility: PT eval appreciated Living condition: Lives at home Goals of care:   Code Status: Full Code  Nutritional status: Body mass index is 38.28 kg/m.     Diet:  Diet Order             Diet regular Room service  appropriate? Yes; Fluid consistency: Thin  Diet effective now                  DVT prophylaxis:  apixaban (ELIQUIS) tablet 5 mg  Antimicrobials: IV Ancef and vancomycin. Fluid: Reduce IV hydration to 50 mill per hour. Consultants: Orthopedics Family Communication: None at bedside  Status is: Inpatient  Remains inpatient appropriate because: On broad-spectrum antibiotics, pending culture.  Pending determination of antibiotics. Dispo: The patient is from: Home              Anticipated d/c is to: PT recommended SNF              Patient currently is not medically stable to d/c.   Difficult to place patient No     Infusions:   sodium chloride 75 mL/hr at 04/15/21 2128   [START ON 04/18/2021]  ceFAZolin (ANCEF) IV      Scheduled Meds:  allopurinol  100 mg Oral Daily   apixaban  5 mg Oral BID   atorvastatin  80 mg Oral QHS   carvedilol  25 mg Oral BID WC   Chlorhexidine Gluconate Cloth  6 each Topical Q0600   docusate sodium  100 mg Oral Daily   ferrous sulfate  325 mg Oral Q breakfast   gabapentin  300 mg Oral BID   insulin aspart  0-15 Units Subcutaneous TID WC   insulin glargine-yfgn  30 Units Subcutaneous QHS   isosorbide mononitrate  30 mg Oral Daily   latanoprost  1 drop Both Eyes QHS   levothyroxine  75 mcg Oral Q0600   mupirocin ointment  1 application Nasal BID    PRN meds: acetaminophen, bisacodyl, hydrALAZINE, morphine injection, ondansetron **OR** ondansetron (ZOFRAN) IV, oxyCODONE, polyethylene glycol, senna-docusate   Antimicrobials: Anti-infectives (From admission, onward)    Start     Dose/Rate Route Frequency Ordered Stop   04/18/21 0600  ceFAZolin (ANCEF) IVPB 2g/100 mL premix        2 g 200 mL/hr over 30 Minutes Intravenous Every 8 hours 04/17/21 1020 05/26/21 2359   04/17/21 1200  vancomycin (VANCOREADY) IVPB 1250 mg/250 mL  Status:  Discontinued        1,250 mg 166.7 mL/hr over 90 Minutes Intravenous Every 48 hours 04/15/21 1424 04/17/21 1020    04/16/21 1700  vancomycin (VANCOREADY) IVPB 1250 mg/250 mL  Status:  Discontinued        1,250 mg 166.7 mL/hr over 90 Minutes Intravenous Every 48 hours 04/14/21 1615 04/15/21 1424   04/15/21 1515  vancomycin (VANCOREADY) IVPB 1500 mg/300 mL        1,500 mg 150 mL/hr over 120 Minutes Intravenous  Once 04/15/21 1424 04/15/21 1657   04/15/21 0600  ceFAZolin (ANCEF) IVPB 2g/100 mL premix  Status:  Discontinued        2 g 200 mL/hr over 30 Minutes Intravenous On call to O.R. 04/14/21 1130 04/14/21 1136   04/14/21 1630  vancomycin (VANCOREADY) IVPB 2000 mg/400 mL  Status:  Discontinued        2,000 mg 200 mL/hr over 120 Minutes Intravenous  Once 04/14/21 1615 04/15/21 1424   04/14/21 1000  cefTRIAXone (ROCEPHIN) 2 g  in sodium chloride 0.9 % 100 mL IVPB  Status:  Discontinued        2 g 200 mL/hr over 30 Minutes Intravenous Every 24 hours 04/13/21 1533 04/17/21 1020   04/13/21 1030  cefTRIAXone (ROCEPHIN) 2 g in sodium chloride 0.9 % 100 mL IVPB        2 g 200 mL/hr over 30 Minutes Intravenous  Once 04/13/21 1018 04/13/21 1158       Objective: Vitals:   04/17/21 0454 04/17/21 0740  BP: (!) 159/96 (!) 164/75  Pulse: 95 70  Resp: 20 15  Temp: 98.3 F (36.8 C) 99 F (37.2 C)  SpO2: 99% 100%    Intake/Output Summary (Last 24 hours) at 04/17/2021 1310 Last data filed at 04/17/2021 0900 Gross per 24 hour  Intake 480 ml  Output 1950 ml  Net -1470 ml   Filed Weights   04/13/21 0756  Weight: 101.2 kg   Weight change:  Body mass index is 38.28 kg/m.   Physical Exam: General exam: elderly African-American female, continues to have mild to moderate right knee pain Skin: No rashes, lesions or ulcers. HEENT: Atraumatic, normocephalic, no obvious bleeding Lungs: Clear to auscultation bilaterally CVS: Regular rate and rhythm, no murmur GI/Abd soft, nontender, nondistended, bowel sound present CNS: Alert, awake, oriented x3 Psychiatry: Mood appropriate Extremities: No pedal  edema, no calf tenderness.  Right knee bandaged with a Hemovac one.  Left upper culture chronically swollen after breast cancer lymph node resection few years ago  Data Review: I have personally reviewed the laboratory data and studies available.  F/u labs ordered Unresulted Labs (From admission, onward)     Start     Ordered   04/17/21 0500  CBC with Differential/Platelet  Daily,   R      04/16/21 0742   04/17/21 0626  Basic metabolic panel  Daily,   R      04/16/21 0742   04/13/21 1443  Glucose, Body Fluid Other  ([facility] ED BODY FLUID PANEL)  ONCE - STAT,   STAT        04/13/21 1442            Signed, Terrilee Croak, MD [facility] 04/17/2021

## 2021-04-17 NOTE — Plan of Care (Signed)

## 2021-04-17 NOTE — Progress Notes (Signed)
     Subjective:  Patient doing well this afternoon. In a chair now. Pain in the knee markedly improved from before sugery. Denies distal n/t. No other issues. Tolerated drain removal at bedside without issue.  Objective:   VITALS:   Vitals:   04/16/21 2107 04/16/21 2348 04/17/21 0454 04/17/21 0740  BP: 127/71  (!) 159/96 (!) 164/75  Pulse: 71 70 95 70  Resp: 20 18 20 15   Temp: 98.6 F (37 C)  98.3 F (36.8 C) 99 F (37.2 C)  TempSrc: Oral  Oral Oral  SpO2: 96% 95% 99% 100%  Weight:      Height:        Sensation intact distally Intact pulses distally Dorsiflexion/Plantar flexion intact Incision: dressing C/D/I Compartment soft   Lab Results  Component Value Date   WBC 8.3 04/17/2021   HGB 8.2 (L) 04/17/2021   HCT 27.2 (L) 04/17/2021   MCV 69.4 (L) 04/17/2021   PLT 171 04/17/2021   BMET    Component Value Date/Time   NA 138 04/17/2021 0343   K 4.5 04/17/2021 0343   CL 106 04/17/2021 0343   CO2 24 04/17/2021 0343   GLUCOSE 138 (H) 04/17/2021 0343   BUN 42 (H) 04/17/2021 0343   CREATININE 1.46 (H) 04/17/2021 0343   CALCIUM 8.0 (L) 04/17/2021 0343   GFRNONAA 38 (L) 04/17/2021 0343     Assessment/Plan: 3 Days Post-Op   Principal Problem:   Septic arthritis (HCC) Active Problems:   Acute systolic CHF (congestive heart failure) (HCC)   Post op recs: WB: WBAT Abx: Cxs positive for MSSA, ID following recommending 6 weeks of cefazolin Imaging: PACU xrays DVT prophylaxis: Resume Eliquis 2.5 twice daily starting postop day 1-2.  Then return to full anticoagulation dose 5 mg Eliquis twice daily starting postop day 3 Dressing: Prevena VAC dressing to be changed on postop day 7 to dry dressing. Drain:  Removed 11/21 Follow up: 2 weeks after surgery for a wound check with Dr. Zachery Dakins at [facility].  Address: [address] [place], [place], [place] [address]  Office Phone: [phone]    Danielle [name] [address] [place] 04/17/2021, 1:44  PM   Charlies Constable, MD Cell [phone]  Contact information:   Weekdays 7am-5pm epic message Dr. Zachery Dakins, or call office for patient follow up: [phone] After hours and holidays please check [email or web address] for group call information for [facility]

## 2021-04-17 NOTE — Progress Notes (Signed)
Physical Therapy Treatment Patient Details Name: Danielle [name] MRN: [id number] DOB: 1945-08-22 Today's Date: 04/17/2021   History of Present Illness 75 y/o female presented to ED on 11/17 with cellulitis and concern for prosthetic joint infection. S/p fluid aspiration and I&D and polyexchange R TKA on 11/18. PMH: chronic combined systolic and diastolic CHF, CKD stage III, IDDM, HTN, bilateral knee replacement, breast cancer s/p chemo and lumpectomy, OSA on CPAP, hypothyroidism.    PT Comments    Pt reporting increased pain today, but willing and determined to get OOB to chair. She required mod assist supine to sit, mod assist sit to stand, and dependent transfer bed to recliner using stedy. Increased time required to complete all mobility skills. Pt in recliner with feet elevated at end of session.    Recommendations for follow up therapy are one component of a multi-disciplinary discharge planning process, led by the attending physician.  Recommendations may be updated based on patient status, additional functional criteria and insurance authorization.  Follow Up Recommendations  Skilled nursing-short term rehab (<3 hours/day)     Assistance Recommended at Discharge Frequent or constant Supervision/Assistance  Equipment Recommendations  Rolling walker (2 wheels)    Recommendations for Other Services       Precautions / Restrictions Precautions Precautions: Fall;Other (comment) Precaution Comments: wound vac, hemovac Restrictions RLE Weight Bearing: Weight bearing as tolerated     Mobility  Bed Mobility Overal bed mobility: Needs Assistance Bed Mobility: Supine to Sit     Supine to sit: Mod assist;HOB elevated     General bed mobility comments: increased time, +rail, assist with RLE, use of bed pad to scoot to EOB    Transfers Overall transfer level: Needs assistance Equipment used: Rolling walker (2 wheels) Transfers: Sit to/from Stand;Bed to  chair/wheelchair/BSC Sit to Stand: Mod assist;From elevated surface           General transfer comment: mod assist to power up to stand from elevated bed. First standing trial with RW. Pt unable to attain full, upright stance. Therfore, transitioned to stedy for sit to stand and pivot to recliner. +2 safety/equip for transfer. Transfer via Lift Equipment: Stedy  Ambulation/Gait               General Gait Details: unable due to pain   Stairs             Wheelchair Mobility    Modified Rankin (Stroke Patients Only)       Balance Overall balance assessment: Needs assistance Sitting-balance support: Feet supported;Single extremity supported;Bilateral upper extremity supported Sitting balance-Leahy Scale: Fair     Standing balance support: Bilateral upper extremity supported;During functional activity Standing balance-Leahy Scale: Poor Standing balance comment: reliant on external support                            Cognition Arousal/Alertness: Awake/alert Behavior During Therapy: WFL for tasks assessed/performed Overall Cognitive Status: Within Functional Limits for tasks assessed                                          Exercises      General Comments        Pertinent Vitals/Pain Pain Assessment: Faces Faces Pain Scale: Hurts even more Pain Location: R knee Pain Descriptors / Indicators: Grimacing;Guarding Pain Intervention(s): Limited activity within patient's tolerance;Monitored during session;Repositioned;Ice applied  Home Living                          Prior Function            PT Goals (current goals can now be found in the care plan section) Acute Rehab PT Goals Patient Stated Goal: decrease pain Progress towards PT goals: Progressing toward goals    Frequency    Min 3X/week      PT Plan Current plan remains appropriate    Co-evaluation              AM-PAC PT "6 Clicks"  Mobility   Outcome Measure  Help needed turning from your back to your side while in a flat bed without using bedrails?: A Lot Help needed moving from lying on your back to sitting on the side of a flat bed without using bedrails?: A Lot Help needed moving to and from a bed to a chair (including a wheelchair)?: Total Help needed standing up from a chair using your arms (e.g., wheelchair or bedside chair)?: A Lot Help needed to walk in hospital room?: Total Help needed climbing 3-5 steps with a railing? : Total 6 Click Score: 9    End of Session Equipment Utilized During Treatment: Gait belt Activity Tolerance: Patient limited by pain Patient left: in chair;with call bell/phone within reach Nurse Communication: Mobility status PT Visit Diagnosis: Unsteadiness on feet (R26.81);Muscle weakness (generalized) (M62.81);Difficulty in walking, not elsewhere classified (R26.2)     Time: 7356-7014 PT Time Calculation (min) (ACUTE ONLY): 30 min  Charges:  $Therapeutic Activity: 23-37 mins                     Lorrin Goodell, PT  Office # [phone] Pager [phone]    Lorriane Shire 04/17/2021, 12:49 PM

## 2021-04-17 NOTE — Progress Notes (Signed)
PT Cancellation Note  Patient Details Name: Danielle [name] MRN: [id number] DOB: 1945-09-10   Cancelled Treatment:    Reason Eval/Treat Not Completed: Pain limiting ability to participate. Pt declining PT x 2 attempts this morning due to pain. Pt received pain meds after first attempt. Pt provided with ice pack on 2nd attempt. PT to re-attempt as time allows.   Lorriane Shire 04/17/2021, 10:00 AM  Lorrin Goodell, PT  Office # [phone] Pager [phone]

## 2021-04-17 NOTE — Progress Notes (Signed)
[place] for Infectious Disease  Date of Admission:  04/13/2021   Total days of inpatient antibiotics 5  Principal Problem:   Septic arthritis (Danielle [name]) Active Problems:   Acute systolic CHF (congestive heart failure) [facility])          Assessment: 75 year old female with breast cancer status post chemo lumpectomy, bilateral knee replacements, diabetes with A1c 7.5 on 04/13/2021 admitted for right knee PJI.  Patient reported right knee.  Started day prior to admission.  Ortho has been consulted and patient underwent I&D of right knee with polyexchange.    #Right knee PJI SP I&D and poly exchange on 11/8 #OR Cx form 11/18+ staph aureus #Diabetes -Per operative note of purulent fluid was encountered -OR Cx+ MSSA Recommendations -D/C ceftriaxone -Start cefazolin to complete 6 weeks of IV antibiotics(EOT 12/29) will need suppressive antibiotics following IV therapy. No plans for prosthesis explantation at this point -Hold off on adding rifampin given pt is on Eliquis -Follow-up with ID at the end of IV treatment. ID will sign off   OPAT ORDERS:  Diagnosis: Right knee PJI  Culture Result: MSSA  Allergies  Allergen Reactions   Other Other (See Comments)    Blood Product Refusal (Jehovah's witness)     Sulfa Antibiotics Rash   Uloric [Febuxostat] Rash     Discharge antibiotics to be given via PICC line:  Per pharmacy protocol yes    Duration: 6 weeks End Date: 11/29  [facility] Care Per Protocol with Biopatch Use: Home health RN for IV administration and teaching, line care and labs.    Labs weekly while on IV antibiotics: _Y_ CBC with differential __ BMP **TWICE WEEKLY ON VANCOMYCIN  _Y_ CMP _Y_ CRP _Y_ ESR __ Vancomycin trough TWICE WEEKLY __ CK  _Y_ Please pull PIC at completion of IV antibiotics __ Please leave PIC in place until doctor has seen patient or been notified  Fax weekly labs to [phone]  Clinic Follow Up Appt: 05/24/21  @  [facility] with Dr. Candiss Norse  Microbiology:   Antibiotics: Ceftriaxone 11/17-p Vancomycin 11/19 Cefazolin 11/18 Cultures: OR Cx on 11/18: MSSA 3/3  SUBJECTIVE: Resting in bed. Complaining of knee pain. She reports she has stable housing. She agrees to home health coming in for her antibiotics.  Review of Systems: ROS   Scheduled Meds:  allopurinol  100 mg Oral Daily   apixaban  5 mg Oral BID   atorvastatin  80 mg Oral QHS   carvedilol  25 mg Oral BID WC   Chlorhexidine Gluconate Cloth  6 each Topical Q0600   docusate sodium  100 mg Oral Daily   ferrous sulfate  325 mg Oral Q breakfast   gabapentin  300 mg Oral BID   insulin aspart  0-15 Units Subcutaneous TID WC   insulin glargine-yfgn  20 Units Subcutaneous QHS   latanoprost  1 drop Both Eyes QHS   levothyroxine  75 mcg Oral Q0600   mupirocin ointment  1 application Nasal BID   Continuous Infusions:  sodium chloride 75 mL/hr at 04/15/21 2128   [START ON 04/18/2021]  ceFAZolin (ANCEF) IV     PRN Meds:.acetaminophen, bisacodyl, hydrALAZINE, morphine injection, ondansetron **OR** ondansetron (ZOFRAN) IV, oxyCODONE, polyethylene glycol, senna-docusate Allergies  Allergen Reactions   Other Other (See Comments)    Blood Product Refusal (Jehovah's witness)     Sulfa Antibiotics Rash   Uloric [Febuxostat] Rash    OBJECTIVE: Vitals:   04/16/21 2107 04/16/21 2348 04/17/21  0454 04/17/21 0740  BP: 127/71  (!) 159/96 (!) 164/75  Pulse: 71 70 95 70  Resp: 20 18 20 15   Temp: 98.6 F (37 C)  98.3 F (36.8 C) 99 F (37.2 C)  TempSrc: Oral  Oral Oral  SpO2: 96% 95% 99% 100%  Weight:      Height:       Body mass index is 38.28 kg/m.  Physical Exam Constitutional:      Appearance: Normal appearance.  HENT:     Head: Normocephalic and atraumatic.     Right Ear: Tympanic membrane normal.     Left Ear: Tympanic membrane normal.     Nose: Nose normal.     Mouth/Throat:     Mouth: Mucous membranes are moist.  Eyes:      Extraocular Movements: Extraocular movements intact.     Conjunctiva/sclera: Conjunctivae normal.     Pupils: Pupils are equal, round, and reactive to light.  Cardiovascular:     Rate and Rhythm: Normal rate and regular rhythm.     Heart sounds: No murmur heard.   No friction rub. No gallop.  Pulmonary:     Effort: Pulmonary effort is normal.     Breath sounds: Normal breath sounds.  Abdominal:     General: Abdomen is flat.     Palpations: Abdomen is soft.  Musculoskeletal:        General: Normal range of motion.     Comments: Right knee wound Vac  Skin:    General: Skin is warm and dry.  Neurological:     General: No focal deficit present.     Mental Status: She is alert and oriented to person, place, and time.  Psychiatric:        Mood and Affect: Mood normal.      Lab Results Lab Results  Component Value Date   WBC 8.3 04/17/2021   HGB 8.2 (L) 04/17/2021   HCT 27.2 (L) 04/17/2021   MCV 69.4 (L) 04/17/2021   PLT 171 04/17/2021    Lab Results  Component Value Date   CREATININE 1.46 (H) 04/17/2021   BUN 42 (H) 04/17/2021   NA 138 04/17/2021   K 4.5 04/17/2021   CL 106 04/17/2021   CO2 24 04/17/2021    Lab Results  Component Value Date   ALT 22 04/13/2021   AST 25 04/13/2021   ALKPHOS 56 04/13/2021   BILITOT 0.8 04/13/2021        Laurice Record, [place] for Infectious Disease [facility] 04/17/2021, 11:17 AM

## 2021-04-17 NOTE — Progress Notes (Signed)
PHARMACY CONSULT NOTE FOR:  OUTPATIENT  PARENTERAL ANTIBIOTIC THERAPY (OPAT)  Indication: right knee prosthetic joint infection Regimen: cefazolin 2 g every 8 hours End date: 05/26/2021  IV antibiotic discharge orders are pended. To discharging provider:  please sign these orders via discharge navigator,  Select New Orders & click on the button choice - Manage This Unsigned Work.     Thank you for allowing pharmacy to be a part of this patient's care.  Zenaida Deed, PharmD PGY1 Acute Care Pharmacy Resident  Phone: [phone] 04/17/2021  4:12 PM  Please check [email or web address] for unit-specific pharmacy phone numbers.

## 2021-04-17 NOTE — TOC Progression Note (Signed)
Transition of Care [facility]) - Progression Note    Patient Details  Name: Danielle [name] MRN: [id number] Date of Birth: 11-24-1945  Transition of Care [facility]) CM/SW Contact  Joanne Chars, [place] Phone Number: 04/17/2021, 11:53 AM  Clinical Narrative:   CSW also receiving error message in [place] for PASSR, spoke with Velva Must by phone and issue was corrected.  PASSR: [id number].    Expected Discharge Plan: [place] Barriers to Discharge: Continued Medical Work up, Ship broker, SNF Pending bed offer  Expected Discharge Plan and Services Expected Discharge Plan: [place] arrangements for the past 2 months: Apartment                                       Social Determinants of Health (SDOH) Interventions    Readmission Risk Interventions No flowsheet data found.

## 2021-04-17 NOTE — Plan of Care (Signed)

## 2021-04-18 ENCOUNTER — Telehealth (HOSPITAL_COMMUNITY): Payer: Self-pay | Admitting: Pharmacy Technician

## 2021-04-18 ENCOUNTER — Inpatient Hospital Stay: Payer: Self-pay

## 2021-04-18 DIAGNOSIS — M009 Pyogenic arthritis, unspecified: Secondary | ICD-10-CM | POA: Diagnosis not present

## 2021-04-18 LAB — CBC WITH DIFFERENTIAL/PLATELET
Abs Immature Granulocytes: 0.04 10*3/uL (ref 0.00–0.07)
Basophils Absolute: 0 10*3/uL (ref 0.0–0.1)
Basophils Relative: 0 %
Eosinophils Absolute: 0.1 10*3/uL (ref 0.0–0.5)
Eosinophils Relative: 2 %
HCT: 28.6 % — ABNORMAL LOW (ref 36.0–46.0)
Hemoglobin: 8.4 g/dL — ABNORMAL LOW (ref 12.0–15.0)
Immature Granulocytes: 1 %
Lymphocytes Relative: 30 %
Lymphs Abs: 2.2 10*3/uL (ref 0.7–4.0)
MCH: 20.4 pg — ABNORMAL LOW (ref 26.0–34.0)
MCHC: 29.4 g/dL — ABNORMAL LOW (ref 30.0–36.0)
MCV: 69.4 fL — ABNORMAL LOW (ref 80.0–100.0)
Monocytes Absolute: 0.8 10*3/uL (ref 0.1–1.0)
Monocytes Relative: 10 %
Neutro Abs: 4.2 10*3/uL (ref 1.7–7.7)
Neutrophils Relative %: 57 %
Platelets: 178 10*3/uL (ref 150–400)
RBC: 4.12 MIL/uL (ref 3.87–5.11)
RDW: 15.7 % — ABNORMAL HIGH (ref 11.5–15.5)
WBC: 7.4 10*3/uL (ref 4.0–10.5)
nRBC: 0 % (ref 0.0–0.2)

## 2021-04-18 LAB — BASIC METABOLIC PANEL
Anion gap: 5 (ref 5–15)
BUN: 33 mg/dL — ABNORMAL HIGH (ref 8–23)
CO2: 27 mmol/L (ref 22–32)
Calcium: 8.3 mg/dL — ABNORMAL LOW (ref 8.9–10.3)
Chloride: 106 mmol/L (ref 98–111)
Creatinine, Ser: 1.25 mg/dL — ABNORMAL HIGH (ref 0.44–1.00)
GFR, Estimated: 45 mL/min — ABNORMAL LOW (ref >60)
Glucose, Bld: 156 mg/dL — ABNORMAL HIGH (ref 70–99)
Potassium: 4.2 mmol/L (ref 3.5–5.1)
Sodium: 138 mmol/L (ref 135–145)

## 2021-04-18 LAB — CULTURE, BLOOD (ROUTINE X 2)
Culture: NO GROWTH
Culture: NO GROWTH
Special Requests: ADEQUATE

## 2021-04-18 LAB — GLUCOSE, CAPILLARY
Glucose-Capillary: 123 mg/dL — ABNORMAL HIGH (ref 70–99)
Glucose-Capillary: 147 mg/dL — ABNORMAL HIGH (ref 70–99)
Glucose-Capillary: 133 mg/dL — ABNORMAL HIGH (ref 70–99)
Glucose-Capillary: 128 mg/dL — ABNORMAL HIGH (ref 70–99)

## 2021-04-18 MED ORDER — OXYCODONE HCL 5 MG PO TABS
10.0000 mg | ORAL_TABLET | ORAL | Status: DC | PRN
  Administered 2021-04-18 – 2021-04-20 (×8): 10 mg via ORAL
  Filled 2021-04-18 (×8): qty 2

## 2021-04-18 NOTE — Telephone Encounter (Signed)
Advanced Heart Failure Patient Advocate Encounter  Received a prefilled application for Jardiance assistance. Faxed in on 11/18.

## 2021-04-18 NOTE — Progress Notes (Signed)
Patient seen and examined.  I appreciate Dr. Stan Head assistance during my absence.  Wound VAC is in place, neurologically intact distally.  Patient is slightly lethargic, but interacts appropriately with me and recognizes me.  MSSA septic right knee arthroplasty, continue with antibiotics.  Hopefully we will be able to clear the infection with the single-stage operation.  It sounds as this was a acute presentation and manifestation of periprosthetic sepsis.  Marchia Bond, MD

## 2021-04-18 NOTE — Progress Notes (Signed)
PROGRESS NOTE  Danielle [name]  DOB: [birth year]  PCP: Seward Carol, MD ZOX:[id number]  DOA: 04/13/2021  LOS: 5 days  Hospital Day: 6  Chief Complaint  Patient presents with   Knee Pain    R    Brief narrative: Danielle [name] is a 75 y.o. 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 Patient presented to the ED from home by EMS on 11/17 with complaint of right knee pain for last 1 week, progressively worsening leading up to an episode of excruciating pain the previous night. In the ED, patient had a temperature of 100.5. Labs with WC count slightly elevated 12.1, CRP 1.7, creatinine 1.66 Right knee arthrocentesis was done  Patient was started on ceftriaxone 2 g. Admitted to hospitalist service. Orthopedics consulted.  Subjective: Patient was seen and examined this morning. Propped up in bed.  Not in distress.  Earlier she was on a lot of pain.  Discussed with orthopedics.  I increased dose of oxycodone.  Assessment/Plan: Septic arthritis of prosthetic right knee -Presented with 1 week history of progressively worsening prosthetic right knee -Seen by orthopedics.   -11/18, patient underwent irrigation and debridement and polyexchange of infected right knee arthroplasty. Pain management per orthopedics. -IV antibiotics per ID.  Per ID recommendation from 11/21, ceftriaxone was switched to Ancef for 6 weeks (EOT 12/29).  Patient will need suppressive antibiotics following IV therapy.  Noted no plan for prosthesis explantation at this time.  -Patient to follow-up with ID at the end of the treatment. -I will Order for PICC line placement.   Acute on chronic combined systolic and diastolic CHF  Essential hypertension -Last echo from December 2021 with EF 45 to 50%, grade 2  diastolic dysfunction, mildly elevated pulmonary artery systolic pressure. -Home meds include Coreg 25 mg twice daily, Imdur 30 mg daily, losartan 50 mg daily, torsemide 40 mg daily, Aldactone 25 mg daily -On admission, patient had bilateral pedal edema.  1 dose of Lasix was given.  Later held because of AKI. -Currently on Coreg, Imdur.  Blood pressure in 140s.  Continue to hold losartan, torsemide, Aldactone.  -Respiratory status stable.  AKI on CKD3b -Baseline creatinine less than 1.5.  Creatinine peaked at 1.86 on 11/19.  Gradually improving, 1.25 today.  Continue to hold losartan, torsemide and Aldactone.  Continue to monitor renal function. Recent Labs    04/25/20 1228 04/13/21 0907 04/14/21 0551 04/15/21 0215 04/16/21 0758 04/17/21 0343 04/18/21 0222  BUN 29* 34* 32* 36* 40* 42* 33*  CREATININE 1.45* 1.66* 1.72* 1.86* 1.68* 1.46* 1.25*   Hyperkalemia -Potassium level improved after Lokelma.  Aldactone on hold. Recent Labs  Lab 04/14/21 0551 04/15/21 0215 04/16/21 0758 04/17/21 0343 04/18/21 0222  K 5.6* 4.5 3.8 4.5 4.2   Paroxysmal A. fib -Sinus rhythm now, continue Coreg for rate control -Eliquis resumed.  Renally dosed per pharmacy  Type 2 diabetes mellitus -A1c 7.5 in 11/17 -Home meds include Semglee 75 units daily, Premeal insulin, Jardiance 10 mg daily,  -Currently on Semglee 30 units at night. -Blood sugar level gradually improving. Continue sliding scale insulin.   -Continue Neurontin Recent Labs  Lab 04/17/21 0734 04/17/21 1157 04/17/21 1606 04/17/21 2025 04/18/21 0812  GLUCAP 120* 160* 183* 189* 123*   Gout -Continue allopurinol  Hypothyroidism -Synthroid  OSA -CPAP nightly  Mobility: PT eval appreciated  Living condition: Lives at home Goals of care:   Code Status: Full Code  Nutritional status: Body mass index is 38.28 kg/m.     Diet:  Diet Order             Diet regular Room service appropriate? Yes; Fluid consistency: Thin  Diet  effective now                  DVT prophylaxis: Place TED hose Start: 04/18/21 0806 apixaban (ELIQUIS) tablet 5 mg  Antimicrobials: IV Ancef Fluid: Stop IV hydration Consultants: Orthopedics Family Communication: None at bedside  Status is: Inpatient  Remains inpatient appropriate because: Pending placement, pain control  dispo: The patient is from: Home              Anticipated d/c is to: PT recommended SNF              Patient currently is not medically stable to d/c.   Difficult to place patient No     Infusions:    ceFAZolin (ANCEF) IV 2 g (04/18/21 0516)    Scheduled Meds:  allopurinol  100 mg Oral Daily   apixaban  5 mg Oral BID   atorvastatin  80 mg Oral QHS   carvedilol  25 mg Oral BID WC   Chlorhexidine Gluconate Cloth  6 each Topical Q0600   docusate sodium  100 mg Oral Daily   ferrous sulfate  325 mg Oral Q breakfast   gabapentin  300 mg Oral BID   insulin aspart  0-15 Units Subcutaneous TID WC   insulin glargine-yfgn  30 Units Subcutaneous QHS   isosorbide mononitrate  30 mg Oral Daily   latanoprost  1 drop Both Eyes QHS   levothyroxine  75 mcg Oral Q0600   mupirocin ointment  1 application Nasal BID    PRN meds: acetaminophen, bisacodyl, hydrALAZINE, morphine injection, ondansetron **OR** ondansetron (ZOFRAN) IV, oxyCODONE, polyethylene glycol, senna-docusate   Antimicrobials: Anti-infectives (From admission, onward)    Start     Dose/Rate Route Frequency Ordered Stop   04/18/21 0600  ceFAZolin (ANCEF) IVPB 2g/100 mL premix        2 g 200 mL/hr over 30 Minutes Intravenous Every 8 hours 04/17/21 1020 05/26/21 2359   04/17/21 1200  vancomycin (VANCOREADY) IVPB 1250 mg/250 mL  Status:  Discontinued        1,250 mg 166.7 mL/hr over 90 Minutes Intravenous Every 48 hours 04/15/21 1424 04/17/21 1020   04/16/21 1700  vancomycin (VANCOREADY) IVPB 1250 mg/250 mL  Status:  Discontinued        1,250 mg 166.7 mL/hr over 90 Minutes Intravenous Every 48  hours 04/14/21 1615 04/15/21 1424   04/15/21 1515  vancomycin (VANCOREADY) IVPB 1500 mg/300 mL        1,500 mg 150 mL/hr over 120 Minutes Intravenous  Once 04/15/21 1424 04/15/21 1657   04/15/21 0600  ceFAZolin (ANCEF) IVPB 2g/100 mL premix  Status:  Discontinued        2 g 200 mL/hr over 30 Minutes Intravenous On call to O.R. 04/14/21 1130 04/14/21 1136   04/14/21 1630  vancomycin (VANCOREADY) IVPB 2000 mg/400 mL  Status:  Discontinued        2,000 mg 200 mL/hr over 120 Minutes Intravenous  Once 04/14/21 1615 04/15/21 1424   04/14/21 1000  cefTRIAXone (ROCEPHIN) 2 g in sodium chloride 0.9 % 100 mL IVPB  Status:  Discontinued        2 g 200 mL/hr over 30  Minutes Intravenous Every 24 hours 04/13/21 1533 04/17/21 1020   04/13/21 1030  cefTRIAXone (ROCEPHIN) 2 g in sodium chloride 0.9 % 100 mL IVPB        2 g 200 mL/hr over 30 Minutes Intravenous  Once 04/13/21 1018 04/13/21 1158       Objective: Vitals:   04/18/21 0810 04/18/21 0944  BP: (!) 135/57 (!) 141/65  Pulse: 66 72  Resp: 16   Temp: 98.3 F (36.8 C) 99.3 F (37.4 C)  SpO2: 98% 100%    Intake/Output Summary (Last 24 hours) at 04/18/2021 1042 Last data filed at 04/18/2021 0947 Gross per 24 hour  Intake 420 ml  Output --  Net 420 ml   Filed Weights   04/13/21 0756  Weight: 101.2 kg   Weight change:  Body mass index is 38.28 kg/m.   Physical Exam: General exam: elderly African-American female, continues to have mild to moderate right knee pain Skin: No rashes, lesions or ulcers. HEENT: Atraumatic, normocephalic, no obvious bleeding Lungs: Clear to auscultation bilaterally CVS: Regular rate and rhythm, no murmur GI/Abd soft, nontender, nondistended, bowel sound present CNS: Alert, awake, oriented x3 Psychiatry: Mood appropriate Extremities: No pedal edema, no calf tenderness.  Right knee is bandaged.  Left upper extremity chronically swollen after breast cancer lymph node resection few years ago  Data  Review: I have personally reviewed the laboratory data and studies available.  F/u labs ordered Unresulted Labs (From admission, onward)     Start     Ordered   04/17/21 0500  CBC with Differential/Platelet  Daily,   R      04/16/21 0742   04/17/21 1937  Basic metabolic panel  Daily,   R      04/16/21 0742   04/13/21 1443  Glucose, Body Fluid Other  ([facility] ED BODY FLUID PANEL)  ONCE - STAT,   STAT        04/13/21 1442            Signed, Terrilee Croak, MD [facility] 04/18/2021

## 2021-04-18 NOTE — Progress Notes (Signed)
At bedside to place PICC and patient is unable to sign consent at this time. She has had pain medication per primary RN and is unable to stay awake to obtain consent.

## 2021-04-18 NOTE — Plan of Care (Signed)
  Problem: Clinical Measurements: Goal: Ability to maintain clinical measurements within normal limits will improve Outcome: Progressing   

## 2021-04-18 NOTE — Progress Notes (Signed)
     Subjective:  Patient complaining of itching in the right leg.  Ace wrap removed this morning and she states that it feels much better.  Denies any distal numbness and tingling.  No other issues.  Improve mobility yesterday with physical therapy.  Thigh-high compression stockings ordered to be applied instead of another Ace wrap.  Objective:   VITALS:   Vitals:   04/17/21 1700 04/17/21 2023 04/18/21 0005 04/18/21 0440  BP: (!) 153/70 (!) 145/74  (!) 147/66  Pulse: 74 67 72 70  Resp: 16 17 18 17   Temp: 98.3 F (36.8 C) 98.9 F (37.2 C)  99 F (37.2 C)  TempSrc: Oral     SpO2: 97% 99% 94% 96%  Weight:      Height:        Sensation intact distally Intact pulses distally Dorsiflexion/Plantar flexion intact Incision: dressing C/D/I Compartment soft   Lab Results  Component Value Date   WBC 7.4 04/18/2021   HGB 8.4 (L) 04/18/2021   HCT 28.6 (L) 04/18/2021   MCV 69.4 (L) 04/18/2021   PLT 178 04/18/2021   BMET    Component Value Date/Time   NA 138 04/18/2021 0222   K 4.2 04/18/2021 0222   CL 106 04/18/2021 0222   CO2 27 04/18/2021 0222   GLUCOSE 156 (H) 04/18/2021 0222   BUN 33 (H) 04/18/2021 0222   CREATININE 1.25 (H) 04/18/2021 0222   CALCIUM 8.3 (L) 04/18/2021 0222   GFRNONAA 45 (L) 04/18/2021 0222     Assessment/Plan: 4 Days Post-Op   Principal Problem:   Septic arthritis (HCC) Active Problems:   Acute systolic CHF (congestive heart failure) (HCC)   Post op recs: WB: WBAT Abx: Cxs positive for MSSA, ID following recommending 6 weeks of cefazolin Imaging: PACU xrays DVT prophylaxis: Resume Eliquis 2.5 twice daily starting postop day 1-2.  Then return to full anticoagulation dose 5 mg Eliquis twice daily starting postop day 3 Dressing: Prevena VAC dressing to be changed on postop day 7 to dry dressing. Drain:  Removed 11/21 Follow up: 2 weeks after surgery for a wound check with Dr. Zachery Dakins at [facility].  Address: [address] [place], [place], [place] [address]  Office Phone: [phone]    Virgina Norfolk [place] 04/18/2021, 8:07 AM   Charlies Constable, MD Cell [phone]  Contact information:   EFEOFHQR 7am-5pm epic message Dr. Zachery Dakins, or call office for patient follow up: [phone] After hours and holidays please check [email or web address] for group call information for [facility]

## 2021-04-18 NOTE — Progress Notes (Signed)
PT Cancellation Note  Patient Details Name: Danielle [name] MRN: [id number] DOB: 25-Dec-1945   Cancelled Treatment:    Reason Eval/Treat Not Completed: Pain limiting ability to participate Pt declined mobility today due to worsening pain. Will follow.   Marguarite Arbour A Slater Mcmanaman 04/18/2021, 12:54 PM [name] Severin, PT, DPT Acute Rehabilitation Services Pager [phone] Office [phone]

## 2021-04-19 DIAGNOSIS — M009 Pyogenic arthritis, unspecified: Secondary | ICD-10-CM | POA: Diagnosis not present

## 2021-04-19 LAB — CBC WITH DIFFERENTIAL/PLATELET
Abs Immature Granulocytes: 0.1 10*3/uL — ABNORMAL HIGH (ref 0.00–0.07)
Basophils Absolute: 0 10*3/uL (ref 0.0–0.1)
Basophils Relative: 1 %
Eosinophils Absolute: 0.1 10*3/uL (ref 0.0–0.5)
Eosinophils Relative: 2 %
HCT: 28.3 % — ABNORMAL LOW (ref 36.0–46.0)
Hemoglobin: 8.6 g/dL — ABNORMAL LOW (ref 12.0–15.0)
Immature Granulocytes: 1 %
Lymphocytes Relative: 30 %
Lymphs Abs: 2.3 10*3/uL (ref 0.7–4.0)
MCH: 21 pg — ABNORMAL LOW (ref 26.0–34.0)
MCHC: 30.4 g/dL (ref 30.0–36.0)
MCV: 69.2 fL — ABNORMAL LOW (ref 80.0–100.0)
Monocytes Absolute: 0.6 10*3/uL (ref 0.1–1.0)
Monocytes Relative: 8 %
Neutro Abs: 4.5 10*3/uL (ref 1.7–7.7)
Neutrophils Relative %: 58 %
Platelets: 221 10*3/uL (ref 150–400)
RBC: 4.09 MIL/uL (ref 3.87–5.11)
RDW: 15.9 % — ABNORMAL HIGH (ref 11.5–15.5)
WBC: 7.7 10*3/uL (ref 4.0–10.5)
nRBC: 0 % (ref 0.0–0.2)

## 2021-04-19 LAB — GLUCOSE, CAPILLARY
Glucose-Capillary: 126 mg/dL — ABNORMAL HIGH (ref 70–99)
Glucose-Capillary: 119 mg/dL — ABNORMAL HIGH (ref 70–99)
Glucose-Capillary: 116 mg/dL — ABNORMAL HIGH (ref 70–99)
Glucose-Capillary: 151 mg/dL — ABNORMAL HIGH (ref 70–99)

## 2021-04-19 LAB — BASIC METABOLIC PANEL
Anion gap: 9 (ref 5–15)
BUN: 30 mg/dL — ABNORMAL HIGH (ref 8–23)
CO2: 22 mmol/L (ref 22–32)
Calcium: 8.7 mg/dL — ABNORMAL LOW (ref 8.9–10.3)
Chloride: 108 mmol/L (ref 98–111)
Creatinine, Ser: 1.27 mg/dL — ABNORMAL HIGH (ref 0.44–1.00)
GFR, Estimated: 44 mL/min — ABNORMAL LOW (ref >60)
Glucose, Bld: 123 mg/dL — ABNORMAL HIGH (ref 70–99)
Potassium: 4.6 mmol/L (ref 3.5–5.1)
Sodium: 139 mmol/L (ref 135–145)

## 2021-04-19 LAB — AEROBIC/ANAEROBIC CULTURE W GRAM STAIN (SURGICAL/DEEP WOUND)

## 2021-04-19 MED ORDER — SODIUM CHLORIDE 0.9% FLUSH
10.0000 mL | Freq: Two times a day (BID) | INTRAVENOUS | Status: DC
  Administered 2021-04-19 – 2021-04-24 (×8): 10 mL

## 2021-04-19 MED ORDER — SODIUM CHLORIDE 0.9% FLUSH
10.0000 mL | INTRAVENOUS | Status: DC | PRN
  Administered 2021-04-24: 15:00:00 10 mL

## 2021-04-19 MED ORDER — LOSARTAN POTASSIUM 50 MG PO TABS
50.0000 mg | ORAL_TABLET | Freq: Every day | ORAL | Status: DC
Start: 2021-04-19 — End: 2021-04-24
  Administered 2021-04-19 – 2021-04-24 (×6): 50 mg via ORAL
  Filled 2021-04-19 (×6): qty 1

## 2021-04-19 MED ORDER — CHLORHEXIDINE GLUCONATE CLOTH 2 % EX PADS
6.0000 | MEDICATED_PAD | Freq: Every day | CUTANEOUS | Status: DC
  Administered 2021-04-19 – 2021-04-24 (×6): 6 via TOPICAL

## 2021-04-19 NOTE — Progress Notes (Signed)
Mobility Specialist Criteria Algorithm Info.  04/19/21 1140  Mobility  Activity Refused mobility (Due to pain)  Mobility Response RN notified    04/19/2021 12:05 PM

## 2021-04-19 NOTE — Progress Notes (Signed)
Physical Therapy Treatment Patient Details Name: Danielle [name] MRN: [id number] DOB: 1946/02/13 Today's Date: 04/19/2021   History of Present Illness 75 y/o female presented to ED on 11/17 with cellulitis and concern for prosthetic joint infection. S/p fluid aspiration and I&D and polyexchange R TKA on 11/18. PMH: chronic combined systolic and diastolic CHF, CKD stage III, IDDM, HTN, bilateral knee replacement, breast cancer s/p chemo and lumpectomy, OSA on CPAP, hypothyroidism.    PT Comments    Pt was upset that PT had come by to see her and asking for a visit to update insurance.  Pt is initially a bit agitated about it but was able to do a therapy session.  Pt was able to roll with min assist and was improved in pain after movement.  Continues to decline to do ROM on RLE but will encourage her to do at least isometric movement.  Follow for acute PT goals as tolerated.   Recommendations for follow up therapy are one component of a multi-disciplinary discharge planning process, led by the attending physician.  Recommendations may be updated based on patient status, additional functional criteria and insurance authorization.  Follow Up Recommendations  Skilled nursing-short term rehab (<3 hours/day)     Assistance Recommended at Discharge Frequent or constant Supervision/Assistance  Equipment Recommendations  Rolling walker (2 wheels)    Recommendations for Other Services       Precautions / Restrictions Precautions Precautions: Fall;Other (comment) Precaution Comments: wound vac Restrictions Weight Bearing Restrictions: No RLE Weight Bearing: Weight bearing as tolerated     Mobility  Bed Mobility Overal bed mobility: Needs Assistance Bed Mobility: Rolling (scooting up on bed) Rolling: Min assist;+2 for physical assistance;+2 for safety/equipment         General bed mobility comments: two max to scoot up bed with pt helping to maneuver    Transfers                    General transfer comment: declined    Ambulation/Gait               General Gait Details: declined OOB   Marine scientist [place] (Stroke Patients Only)       Balance                                            Cognition Arousal/Alertness: Awake/alert Behavior During Therapy: WFL for tasks assessed/performed Overall Cognitive Status: Within Functional Limits for tasks assessed                                 General Comments: pt is in pain but finally agreed to some moving        Exercises      General Comments General comments (skin integrity, edema, etc.): Pt is in bed with wet linen, talked with her about having to move to change bed.  Pt was able to assist rolling min assist but is fearful of any movement on R knee.  Has a mild confusion to her conversation, stating MD has forbidden her to bend the R knee      Pertinent Vitals/Pain Pain Assessment: Faces Faces Pain Scale: Hurts even more Pain Location: R knee Pain Descriptors / Indicators:  Grimacing;Guarding;Sore Pain Intervention(s): Limited activity within patient's tolerance;Monitored during session;Premedicated before session;Repositioned;Ice applied    Home Living                          Prior Function            PT Goals (current goals can now be found in the care plan section) Acute Rehab PT Goals Patient Stated Goal: decrease pain    Frequency    Min 3X/week      PT Plan Current plan remains appropriate    Co-evaluation              AM-PAC PT "6 Clicks" Mobility   Outcome Measure  Help needed turning from your back to your side while in a flat bed without using bedrails?: A Little Help needed moving from lying on your back to sitting on the side of a flat bed without using bedrails?: A Lot Help needed moving to and from a bed to a chair (including a wheelchair)?: A Lot Help needed  standing up from a chair using your arms (e.g., wheelchair or bedside chair)?: A Lot Help needed to walk in hospital room?: A Lot Help needed climbing 3-5 steps with a railing? : Total 6 Click Score: 12    End of Session   Activity Tolerance: No increased pain Patient left: in bed;with call bell/phone within reach;with bed alarm set Nurse Communication: Mobility status PT Visit Diagnosis: Unsteadiness on feet (R26.81);Muscle weakness (generalized) (M62.81);Difficulty in walking, not elsewhere classified (R26.2)     Time: 5597-4163 PT Time Calculation (min) (ACUTE ONLY): 34 min  Charges:  $Therapeutic Activity: 23-37 mins            Ramond Dial 04/19/2021, 2:11 PM  Mee Hives, PT PhD Acute Rehab Dept. Number: [place] [facility] [place]

## 2021-04-19 NOTE — Plan of Care (Signed)

## 2021-04-19 NOTE — TOC Progression Note (Signed)
Transition of Care [facility]) - Progression Note    Patient Details  Name: Danielle [name] MRN: [id number] Date of Birth: Dec 12, 1945  [facility] CM/SW Contact  Danielle Chars, LCSW Phone Number: 04/19/2021, 10:44 AM  Clinical Narrative:   CSW spoke with pt regarding bed offers at [name].  Pt asked CSW to speak with her daughter Danielle [name].  CSW spoke with Danielle [name] who chooses Danielle [name].  CSW spoke with Danielle [name] at [facility], who will initiate insurance auth.  It will likely be several days on insurance auth, but Danielle [name] can potentially take pt over the weekend once auth is back.  Danielle [name] will be available by phone Friday and all weekend to check status.      Expected Discharge Plan: [place] Barriers to Discharge: Continued Medical Work up, Ship broker, SNF Pending bed offer  Expected Discharge Plan and Services Expected Discharge Plan: [place] arrangements for the past 2 months: Apartment                                       Social Determinants of Health (SDOH) Interventions    Readmission Risk Interventions No flowsheet data found.

## 2021-04-19 NOTE — Progress Notes (Signed)
PROGRESS NOTE  Danielle [name]  DOB: 04/24/1946  PCP: Seward Carol, MD PTW:[id number]  DOA: 04/13/2021  LOS: 6 days  Hospital Day: 7  Chief Complaint  Patient presents with   Knee Pain    R    Brief narrative: Danielle [name] is a 75 y.o. 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 Patient presented to the ED from home by EMS on 11/17 with complaint of right knee pain for last 1 week, progressively worsening leading up to an episode of excruciating pain the previous night. In the ED, patient had a temperature of 100.5. Labs with WC count slightly elevated 12.1, CRP 1.7, creatinine 1.66 Right knee arthrocentesis was done  Patient was started on ceftriaxone 2 g. Admitted to hospitalist service. Orthopedics consulted.  Subjective: Patient was seen and examined this morning. Propped up in bed.  Not in distress.  She is getting PICC line placement this morning.  Pain controlled.  Assessment/Plan: Septic arthritis of prosthetic right knee -Presented with 1 week history of progressively worsening prosthetic right knee -Seen by orthopedics.   -11/18, patient underwent irrigation and debridement and polyexchange of infected right knee arthroplasty. Pain management per orthopedics. -IV antibiotics per ID.  Per ID recommendation from 11/21, ceftriaxone was switched to Ancef for 6 weeks (EOT 12/29).  Patient will need suppressive antibiotics following IV therapy.  Noted no plan for prosthesis explantation at this time.  -Patient to follow-up with ID at the end of the treatment. -Undergoing PICC line placement this morning.   Acute on chronic combined systolic and diastolic CHF  Essential hypertension -Last echo from December 2021 with EF 45 to 70%, grade 2 diastolic dysfunction,  mildly elevated pulmonary artery systolic pressure. -Home meds include Coreg 25 mg twice daily, Imdur 30 mg daily, losartan 50 mg daily, torsemide 40 mg daily, Aldactone 25 mg daily -On admission, patient had bilateral pedal edema.  1 dose of Lasix was given.  Later held because of AKI. -Currently on Coreg, Imdur.  Blood pressure in 140s mostly.  I would resume losartan today.  Continue to hold torsemide, Aldactone.  -Respiratory status stable.  AKI on CKD3b -Baseline creatinine less than 1.5.  Creatinine peaked at 1.86 on 11/19.  Gradually improving, 1.27 today.  Continue to hold torsemide and Aldactone.  Continue to monitor renal function. Recent Labs    04/25/20 1228 04/13/21 0907 04/14/21 0551 04/15/21 0215 04/16/21 0758 04/17/21 0343 04/18/21 0222 04/19/21 0205  BUN 29* 34* 32* 36* 40* 42* 33* 30*  CREATININE 1.45* 1.66* 1.72* 1.86* 1.68* 1.46* 1.25* 1.27*    Hyperkalemia -Potassium level improved after Lokelma.  Aldactone on hold. Recent Labs  Lab 04/15/21 0215 04/16/21 0758 04/17/21 0343 04/18/21 0222 04/19/21 0205  K 4.5 3.8 4.5 4.2 4.6    Paroxysmal A. fib -Sinus rhythm now, continue Coreg for rate control -Eliquis resumed.  Renally dosed per pharmacy  Type 2 diabetes mellitus -A1c 7.5 in 11/17 -Home meds include Semglee 75 units daily, Premeal insulin, Jardiance 10 mg daily,  -Currently on Semglee 30 units at night. -Blood sugar level gradually improving. Continue sliding scale insulin.   -Continue Neurontin. Recent Labs  Lab 04/18/21 0812 04/18/21 1120 04/18/21 1611 04/18/21 2050 04/19/21 0752  GLUCAP 123* 147* 133* 128* 126*    Gout -Continue allopurinol  Hypothyroidism -Synthroid  OSA -CPAP nightly  Mobility: PT eval appreciated Living condition: Lives at home Goals of care:   Code Status: Full Code  Nutritional status: Body mass index is 38.28 kg/m.     Diet:  Diet Order             Diet regular Room service appropriate? Yes;  Fluid consistency: Thin  Diet effective now                  DVT prophylaxis: Place TED hose Start: 04/18/21 0806 apixaban (ELIQUIS) tablet 5 mg  Antimicrobials: IV Ancef Fluid: Off IV fluid Consultants: Orthopedics Family Communication: None at bedside  Status is: Inpatient  Remains inpatient appropriate because: Pending placement, pain control  dispo: The patient is from: Home              Anticipated d/c is to: PT recommended SNF              Patient currently is not medically stable to d/c.   Difficult to place patient No     Infusions:    ceFAZolin (ANCEF) IV 2 g (04/19/21 0542)    Scheduled Meds:  allopurinol  100 mg Oral Daily   apixaban  5 mg Oral BID   atorvastatin  80 mg Oral QHS   carvedilol  25 mg Oral BID WC   Chlorhexidine Gluconate Cloth  6 each Topical Daily   docusate sodium  100 mg Oral Daily   ferrous sulfate  325 mg Oral Q breakfast   gabapentin  300 mg Oral BID   insulin aspart  0-15 Units Subcutaneous TID WC   insulin glargine-yfgn  30 Units Subcutaneous QHS   isosorbide mononitrate  30 mg Oral Daily   latanoprost  1 drop Both Eyes QHS   levothyroxine  75 mcg Oral Q0600   sodium chloride flush  10-40 mL Intracatheter Q12H    PRN meds: acetaminophen, bisacodyl, hydrALAZINE, morphine injection, ondansetron **OR** ondansetron (ZOFRAN) IV, oxyCODONE, polyethylene glycol, senna-docusate, sodium chloride flush   Antimicrobials: Anti-infectives (From admission, onward)    Start     Dose/Rate Route Frequency Ordered Stop   04/18/21 0600  ceFAZolin (ANCEF) IVPB 2g/100 mL premix        2 g 200 mL/hr over 30 Minutes Intravenous Every 8 hours 04/17/21 1020 05/26/21 2359   04/17/21 1200  vancomycin (VANCOREADY) IVPB 1250 mg/250 mL  Status:  Discontinued        1,250 mg 166.7 mL/hr over 90 Minutes Intravenous Every 48 hours 04/15/21 1424 04/17/21 1020   04/16/21 1700  vancomycin (VANCOREADY) IVPB 1250 mg/250 mL  Status:  Discontinued        1,250  mg 166.7 mL/hr over 90 Minutes Intravenous Every 48 hours 04/14/21 1615 04/15/21 1424   04/15/21 1515  vancomycin (VANCOREADY) IVPB 1500 mg/300 mL        1,500 mg 150 mL/hr over 120 Minutes Intravenous  Once 04/15/21 1424 04/15/21 1657   04/15/21 0600  ceFAZolin (ANCEF) IVPB 2g/100 mL premix  Status:  Discontinued        2 g 200 mL/hr over 30 Minutes Intravenous On call to O.R. 04/14/21 1130 04/14/21 1136   04/14/21 1630  vancomycin (VANCOREADY) IVPB 2000 mg/400 mL  Status:  Discontinued        2,000 mg 200 mL/hr over 120 Minutes Intravenous  Once 04/14/21 1615 04/15/21 1424   04/14/21 1000  cefTRIAXone (ROCEPHIN) 2 g in sodium chloride 0.9 % 100 mL IVPB  Status:  Discontinued  2 g 200 mL/hr over 30 Minutes Intravenous Every 24 hours 04/13/21 1533 04/17/21 1020   04/13/21 1030  cefTRIAXone (ROCEPHIN) 2 g in sodium chloride 0.9 % 100 mL IVPB        2 g 200 mL/hr over 30 Minutes Intravenous  Once 04/13/21 1018 04/13/21 1158       Objective: Vitals:   04/18/21 2250 04/19/21 0755  BP:  (!) 149/65  Pulse: 76 69  Resp: 18 17  Temp:  98.5 F (36.9 C)  SpO2: 95% 98%    Intake/Output Summary (Last 24 hours) at 04/19/2021 1019 Last data filed at 04/19/2021 6301 Gross per 24 hour  Intake 480.06 ml  Output 1050 ml  Net -569.94 ml    Filed Weights   04/13/21 0756  Weight: 101.2 kg   Weight change:  Body mass index is 38.28 kg/m.   Physical Exam: General exam: elderly African-American female, continues to have mild to moderate right knee pain Skin: No rashes, lesions or ulcers. HEENT: Atraumatic, normocephalic, no obvious bleeding Lungs: Clear to auscultation bilaterally CVS: Regular rate and rhythm, no murmur GI/Abd soft, nontender, nondistended, bowel sound present CNS: Alert, awake, oriented x3 Psychiatry: Mood appropriate Extremities: No pedal edema, no calf tenderness.  Right knee is bandaged has wound VAC on.  Left upper extremity chronically swollen after  breast cancer lymph node resection few years ago  Data Review: I have personally reviewed the laboratory data and studies available.  F/u labs ordered Unresulted Labs (From admission, onward)     Start     Ordered   04/13/21 1443  Glucose, Body Fluid Other  [facility] ED BODY FLUID PANEL)  ONCE - STAT,   STAT        04/13/21 1442            Signed, Terrilee Croak, MD [facility] 04/19/2021

## 2021-04-19 NOTE — Progress Notes (Signed)
Peripherally Inserted Central Catheter Placement  The IV Nurse has discussed with the patient and/or persons authorized to consent for the patient, the purpose of this procedure and the potential benefits and risks involved with this procedure.  The benefits include less needle sticks, lab draws from the catheter, and the patient may be discharged home with the catheter. Risks include, but not limited to, infection, bleeding, blood clot (thrombus formation), and puncture of an artery; nerve damage and irregular heartbeat and possibility to perform a PICC exchange if needed/ordered by physician.  Alternatives to this procedure were also discussed.  Bard Power PICC patient education guide, fact sheet on infection prevention and patient information card has been provided to patient /or left at bedside.    PICC Placement Documentation  PICC Single Lumen 04/19/21 Right Brachial 39 cm (Active)  Indication for Insertion or Continuance of Line Prolonged intravenous therapies 04/19/21 0927  Exposed Catheter (cm) 0 cm 04/19/21 8341  Site Assessment Clean;Intact;Dry 04/19/21 9622  Line Status Flushed;Blood return noted;Saline locked 04/19/21 0927  Dressing Type Transparent 04/19/21 0927  Dressing Status Clean;Dry;Intact 04/19/21 0927  Antimicrobial disc in place? Yes 04/19/21 0927  Dressing Change Due 04/26/21 04/19/21 0927       Danielle [name] 04/19/2021, 9:31 AM

## 2021-04-19 NOTE — Plan of Care (Signed)
  Problem: Clinical Measurements: Goal: Ability to maintain clinical measurements within normal limits will improve Outcome: Progressing Goal: Respiratory complications will improve Outcome: Progressing   

## 2021-04-19 NOTE — Progress Notes (Signed)
     Subjective:  Patient doing well this morning.  Awaiting PICC line.  Pain well controlled.  Itching pain from yesterday resolved.  Nursing was unable to put on thigh-high TED hose stockings yesterday however I got stocking on her leg this morning which she states feels fine.  Denies distal numbness and tingling.  No new issues  Objective:   VITALS:   Vitals:   04/18/21 0944 04/18/21 1440 04/18/21 2049 04/18/21 2250  BP: (!) 141/65 (!) 150/61 131/66   Pulse: 72 64 64 76  Resp: 16 16 16 18   Temp: 99.3 F (37.4 C) 98.9 F (37.2 C) 98.5 F (36.9 C)   TempSrc: Oral Oral Oral   SpO2: 100% 95% 97% 95%  Weight:      Height:        Sensation intact distally Intact pulses distally Dorsiflexion/Plantar flexion intact Incision: dressing C/D/I Compartment soft   Lab Results  Component Value Date   WBC 7.7 04/19/2021   HGB 8.6 (L) 04/19/2021   HCT 28.3 (L) 04/19/2021   MCV 69.2 (L) 04/19/2021   PLT 221 04/19/2021   BMET    Component Value Date/Time   NA 139 04/19/2021 0205   K 4.6 04/19/2021 0205   CL 108 04/19/2021 0205   CO2 22 04/19/2021 0205   GLUCOSE 123 (H) 04/19/2021 0205   BUN 30 (H) 04/19/2021 0205   CREATININE 1.27 (H) 04/19/2021 0205   CALCIUM 8.7 (L) 04/19/2021 0205   GFRNONAA 44 (L) 04/19/2021 0205     Assessment/Plan: 5 Days Post-Op   Principal Problem:   Septic arthritis (HCC) Active Problems:   Acute systolic CHF (congestive heart failure) (HCC)   Post op recs: WB: WBAT Abx: Cxs positive for MSSA, ID following recommending 6 weeks of cefazolin Imaging: PACU xrays DVT prophylaxis: Resume Eliquis 2.5 twice daily starting postop day 1-2.  Then return to full anticoagulation dose 5 mg Eliquis twice daily starting postop day 3 Dressing: Prevena VAC dressing to be changed on postop day 7 to dry dressing.  Ace wrap removed 11/22, thigh-high TED hose stockings applied by myself on 11/23. Drain:  Removed 11/21 Follow up: 2 weeks after surgery for a  wound check with Dr. Zachery Dakins at [facility].  Address: [address] [place], [place], [place] [address]  Office Phone: [phone]    Danielle [name] 04/19/2021, 7:14 AM   Charlies Constable, MD Cell [phone]  Contact information:   [email or web address] 7am-5pm epic message Dr. Zachery Dakins, or call office for patient follow up: [phone] After hours and holidays please check [email or web address] for group call information for [facility]

## 2021-04-20 DIAGNOSIS — M009 Pyogenic arthritis, unspecified: Secondary | ICD-10-CM | POA: Diagnosis not present

## 2021-04-20 LAB — AEROBIC/ANAEROBIC CULTURE W GRAM STAIN (SURGICAL/DEEP WOUND)
Gram Stain: NONE SEEN
Gram Stain: NONE SEEN
Gram Stain: NONE SEEN

## 2021-04-20 LAB — GLUCOSE, CAPILLARY
Glucose-Capillary: 135 mg/dL — ABNORMAL HIGH (ref 70–99)
Glucose-Capillary: 132 mg/dL — ABNORMAL HIGH (ref 70–99)
Glucose-Capillary: 142 mg/dL — ABNORMAL HIGH (ref 70–99)

## 2021-04-20 MED ORDER — EMPAGLIFLOZIN 10 MG PO TABS
10.0000 mg | ORAL_TABLET | Freq: Every day | ORAL | Status: DC
  Administered 2021-04-20 – 2021-04-24 (×5): 10 mg via ORAL
  Filled 2021-04-20 (×5): qty 1

## 2021-04-20 MED ORDER — OXYCODONE HCL 5 MG PO TABS
10.0000 mg | ORAL_TABLET | Freq: Four times a day (QID) | ORAL | Status: DC | PRN
  Administered 2021-04-20 – 2021-04-24 (×9): 10 mg via ORAL
  Filled 2021-04-20 (×9): qty 2

## 2021-04-20 NOTE — Progress Notes (Signed)
PROGRESS NOTE  Danielle [name]  DOB: Mar 17, 1946  PCP: Seward Carol, MD ZCH:[id number]  DOA: 04/13/2021  LOS: 7 days  Hospital Day: 8  Chief Complaint  Patient presents with   Knee Pain    R    Brief narrative: Danielle [name] is a 75 y.o. 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 Patient presented to the ED from home by EMS on 11/17 with complaint of right knee pain for last 1 week, progressively worsening leading up to an episode of excruciating pain the previous night. In the ED, patient had a temperature of 100.5. Labs with WC count slightly elevated 12.1, CRP 1.7, creatinine 1.66 Right knee arthrocentesis was done  Patient was started on ceftriaxone 2 g. Admitted to hospitalist service. Orthopedics consulted.  Subjective: Patient was seen and examined this morning. Lying down in bed.  Not in distress.  No new symptoms.  Pain controlled. No blood work today.  Assessment/Plan: Septic arthritis of prosthetic right knee -Presented with 1 week history of progressively worsening prosthetic right knee -Seen by orthopedics.   -11/18, patient underwent irrigation and debridement and polyexchange of infected right knee arthroplasty. Noted no plan for prosthesis explantation at this time.  -Per ID recommendation from 11/21, ceftriaxone was switched to Ancef for 6 weeks (EOT 12/29).  PICC line placed on 11/23. Patient will need suppressive antibiotics following IV therapy.   -Patient to follow-up with ID at the end of the treatment. -Per orthopedics note, Prevena VAC dressing to be changed on postop day 7 to dry dressing.   -Currently getting oxycodone 10 mg every 4 hours for pain control.  Reduce to every 6 hours today.   Acute on chronic combined systolic and diastolic CHF   Essential hypertension -Last echo from December 2021 with EF 45 to 28%, grade 2 diastolic dysfunction, mildly elevated pulmonary artery systolic pressure. -Home meds include Coreg 25 mg twice daily, Imdur 30 mg daily, losartan 50 mg daily, torsemide 40 mg daily, Aldactone 25 mg daily -On admission, patient had bilateral pedal edema.  1 dose of Lasix was given.  Later held because of AKI. -Currently on Coreg, Imdur and losartan.  Continue to hold torsemide, Aldactone.  -Respiratory status stable. -Repeat blood work Architectural technologist.  AKI on CKD3b -Baseline creatinine less than 1.5.  Creatinine peaked at 1.86 on 11/19.  Gradually improving, 1.27 today.  Continue to hold torsemide and Aldactone.  Continue to monitor renal function. Recent Labs    04/25/20 1228 04/13/21 0907 04/14/21 0551 04/15/21 0215 04/16/21 0758 04/17/21 0343 04/18/21 0222 04/19/21 0205  BUN 29* 34* 32* 36* 40* 42* 33* 30*  CREATININE 1.45* 1.66* 1.72* 1.86* 1.68* 1.46* 1.25* 1.27*   Hyperkalemia -Potassium level improved after Lokelma.  Aldactone on hold. Recent Labs  Lab 04/15/21 0215 04/16/21 0758 04/17/21 0343 04/18/21 0222 04/19/21 0205  K 4.5 3.8 4.5 4.2 4.6   Paroxysmal A. fib -Sinus rhythm now, continue Coreg for rate control -Eliquis resumed.  Renally dosed per pharmacy  Type 2 diabetes mellitus -A1c 7.5 in 11/17 -Home meds include Semglee 75 units daily, Premeal insulin, Jardiance 10 mg daily,  -Currently on Semglee 30 units at night. -Blood sugar level remains somewhat elevated.  Resume Jardiance.  Continue sliding scale insulin with Accu-Cheks. -Continue Neurontin. Recent Labs  Lab 04/19/21 [id number] 04/19/21 1137 04/19/21 1608 04/19/21 2113  04/20/21 0638  GLUCAP 126* 119* 116* 151* 135*   Gout -Continue allopurinol  Hypothyroidism -Synthroid  OSA -CPAP nightly  Mobility: PT eval appreciated Living condition: Lives at home Goals of care:   Code Status: Full Code  Nutritional  status: Body mass index is 38.28 kg/m.     Diet:  Diet Order             Diet regular Room service appropriate? Yes; Fluid consistency: Thin  Diet effective now                  DVT prophylaxis: Place TED hose Start: 04/18/21 0806 apixaban (ELIQUIS) tablet 5 mg  Antimicrobials: IV Ancef Fluid: Off IV fluid Consultants: Orthopedics Family Communication: None at bedside  Status is: Inpatient  Remains inpatient appropriate because: Pending placement dispo: The patient is from: Home              Anticipated d/c is to: PT recommended SNF              Patient currently is medically stable to d/c.   Difficult to place patient No     Infusions:    ceFAZolin (ANCEF) IV 2 g (04/20/21 0521)    Scheduled Meds:  allopurinol  100 mg Oral Daily   apixaban  5 mg Oral BID   atorvastatin  80 mg Oral QHS   carvedilol  25 mg Oral BID WC   Chlorhexidine Gluconate Cloth  6 each Topical Daily   docusate sodium  100 mg Oral Daily   empagliflozin  10 mg Oral Daily   ferrous sulfate  325 mg Oral Q breakfast   gabapentin  300 mg Oral BID   insulin aspart  0-15 Units Subcutaneous TID WC   insulin glargine-yfgn  30 Units Subcutaneous QHS   isosorbide mononitrate  30 mg Oral Daily   latanoprost  1 drop Both Eyes QHS   levothyroxine  75 mcg Oral Q0600   losartan  50 mg Oral Daily   sodium chloride flush  10-40 mL Intracatheter Q12H    PRN meds: acetaminophen, bisacodyl, hydrALAZINE, morphine injection, ondansetron **OR** ondansetron (ZOFRAN) IV, oxyCODONE, polyethylene glycol, senna-docusate, sodium chloride flush   Antimicrobials: Anti-infectives (From admission, onward)    Start     Dose/Rate Route Frequency Ordered Stop   04/18/21 0600  ceFAZolin (ANCEF) IVPB 2g/100 mL premix        2 g 200 mL/hr over 30 Minutes Intravenous Every 8 hours 04/17/21 1020 05/26/21 2359   04/17/21 1200  vancomycin (VANCOREADY) IVPB 1250 mg/250 mL  Status:  Discontinued        1,250 mg 166.7 mL/hr  over 90 Minutes Intravenous Every 48 hours 04/15/21 1424 04/17/21 1020   04/16/21 1700  vancomycin (VANCOREADY) IVPB 1250 mg/250 mL  Status:  Discontinued        1,250 mg 166.7 mL/hr over 90 Minutes Intravenous Every 48 hours 04/14/21 1615 04/15/21 1424   04/15/21 1515  vancomycin (VANCOREADY) IVPB 1500 mg/300 mL        1,500 mg 150 mL/hr over 120 Minutes Intravenous  Once 04/15/21 1424 04/15/21 1657   04/15/21 0600  ceFAZolin (ANCEF) IVPB 2g/100 mL premix  Status:  Discontinued        2 g 200 mL/hr over 30 Minutes Intravenous On call to O.R. 04/14/21 1130 04/14/21 1136   04/14/21 1630  vancomycin (VANCOREADY) IVPB 2000 mg/400 mL  Status:  Discontinued        2,000 mg 200 mL/hr over 120  Minutes Intravenous  Once 04/14/21 1615 04/15/21 1424   04/14/21 1000  cefTRIAXone (ROCEPHIN) 2 g in sodium chloride 0.9 % 100 mL IVPB  Status:  Discontinued        2 g 200 mL/hr over 30 Minutes Intravenous Every 24 hours 04/13/21 1533 04/17/21 1020   04/13/21 1030  cefTRIAXone (ROCEPHIN) 2 g in sodium chloride 0.9 % 100 mL IVPB        2 g 200 mL/hr over 30 Minutes Intravenous  Once 04/13/21 1018 04/13/21 1158       Objective: Vitals:   04/20/21 0501 04/20/21 0810  BP: 108/88 139/67  Pulse: 74 71  Resp: 17 17  Temp: 98.3 F (36.8 C) 98.5 F (36.9 C)  SpO2: 100% 99%    Intake/Output Summary (Last 24 hours) at 04/20/2021 1011 Last data filed at 04/20/2021 0530 Gross per 24 hour  Intake 180 ml  Output 340 ml  Net -160 ml   Filed Weights   04/13/21 0756  Weight: 101.2 kg   Weight change:  Body mass index is 38.28 kg/m.   Physical Exam: General exam: elderly African-American female, pain controlled Skin: No rashes, lesions or ulcers. HEENT: Atraumatic, normocephalic, no obvious bleeding Lungs: Clear to auscultation bilaterally CVS: Regular rate and rhythm, no murmur GI/Abd soft, nontender, nondistended, bowel sound present CNS: Alert, awake, oriented x3 Psychiatry: Mood  appropriate Extremities: No pedal edema, no calf tenderness.  Right knee is bandaged has wound VAC on.  Left upper extremity chronically swollen after breast cancer lymph node resection few years ago  Data Review: I have personally reviewed the laboratory data and studies available.  F/u labs ordered Unresulted Labs (From admission, onward)     Start     Ordered   04/21/21 0500  CBC with Differential/Platelet  Tomorrow morning,   R        04/20/21 0732   04/21/21 5809  Basic metabolic panel  Tomorrow morning,   R        04/20/21 0732            Signed, Terrilee Croak, MD [facility] 04/20/2021

## 2021-04-20 NOTE — Progress Notes (Signed)
     Subjective:  Patient doing well this morning.  PICC line placed yesterday. Awaiting discharge to rehab in the coming days. Denies distal n/t.  Objective:   VITALS:   Vitals:   04/19/21 1436 04/19/21 1948 04/19/21 2242 04/20/21 0501  BP: (!) 153/76 129/64  108/88  Pulse: 67 65 68 74  Resp: 17 19 18 17   Temp: 98.2 F (36.8 C) 98.5 F (36.9 C)  98.3 F (36.8 C)  TempSrc: Oral Oral  Oral  SpO2: 97% 100% 94% 100%  Weight:      Height:        Sensation intact distally Intact pulses distally Dorsiflexion/Plantar flexion intact Incision: dressing C/D/I Compartment soft   Lab Results  Component Value Date   WBC 7.7 04/19/2021   HGB 8.6 (L) 04/19/2021   HCT 28.3 (L) 04/19/2021   MCV 69.2 (L) 04/19/2021   PLT 221 04/19/2021   BMET    Component Value Date/Time   NA 139 04/19/2021 0205   K 4.6 04/19/2021 0205   CL 108 04/19/2021 0205   CO2 22 04/19/2021 0205   GLUCOSE 123 (H) 04/19/2021 0205   BUN 30 (H) 04/19/2021 0205   CREATININE 1.27 (H) 04/19/2021 0205   CALCIUM 8.7 (L) 04/19/2021 0205   GFRNONAA 44 (L) 04/19/2021 0205     Assessment/Plan: 6 Days Post-Op   Principal Problem:   Septic arthritis (HCC) Active Problems:   Acute systolic CHF (congestive heart failure) (HCC)   Post op recs: WB: WBAT Abx: Cxs positive for MSSA, ID following recommending 6 weeks of cefazolin Imaging: PACU xrays DVT prophylaxis: Resume Eliquis 2.5 twice daily starting postop day 1-2.  Then return to full anticoagulation dose 5 mg Eliquis twice daily starting postop day 3 Dressing: Prevena VAC dressing to be changed on postop day 7 to dry dressing.  Ace wrap removed 11/22, thigh-high TED hose stockings applied by myself on 11/23. Okay to remove TED stockings at night. Drain:  Removed 11/21 Follow up: 2 weeks after surgery for a wound check with Dr. Zachery Dakins at [facility].  Address: [address] [place], [place], [place] [address]  Office Phone: [phone]    Danielle [name] 04/20/2021, 7:49 AM   Charlies Constable, MD Cell [phone]  Contact information:   [email or web address] 7am-5pm epic message Dr. Zachery Dakins, or call office for patient follow up: [phone] After hours and holidays please check [email or web address] for group call information for [facility]

## 2021-04-21 ENCOUNTER — Inpatient Hospital Stay (HOSPITAL_COMMUNITY): Payer: Medicare HMO

## 2021-04-21 DIAGNOSIS — M009 Pyogenic arthritis, unspecified: Secondary | ICD-10-CM | POA: Diagnosis not present

## 2021-04-21 LAB — GLUCOSE, CAPILLARY
Glucose-Capillary: 125 mg/dL — ABNORMAL HIGH (ref 70–99)
Glucose-Capillary: 112 mg/dL — ABNORMAL HIGH (ref 70–99)
Glucose-Capillary: 128 mg/dL — ABNORMAL HIGH (ref 70–99)
Glucose-Capillary: 122 mg/dL — ABNORMAL HIGH (ref 70–99)

## 2021-04-21 LAB — CBC WITH DIFFERENTIAL/PLATELET
Abs Immature Granulocytes: 0.12 10*3/uL — ABNORMAL HIGH (ref 0.00–0.07)
Basophils Absolute: 0 10*3/uL (ref 0.0–0.1)
Basophils Relative: 0 %
Eosinophils Absolute: 0.2 10*3/uL (ref 0.0–0.5)
Eosinophils Relative: 2 %
HCT: 27.9 % — ABNORMAL LOW (ref 36.0–46.0)
Hemoglobin: 8.5 g/dL — ABNORMAL LOW (ref 12.0–15.0)
Immature Granulocytes: 1 %
Lymphocytes Relative: 28 %
Lymphs Abs: 2.8 10*3/uL (ref 0.7–4.0)
MCH: 20.7 pg — ABNORMAL LOW (ref 26.0–34.0)
MCHC: 30.5 g/dL (ref 30.0–36.0)
MCV: 67.9 fL — ABNORMAL LOW (ref 80.0–100.0)
Monocytes Absolute: 1 10*3/uL (ref 0.1–1.0)
Monocytes Relative: 10 %
Neutro Abs: 5.9 10*3/uL (ref 1.7–7.7)
Neutrophils Relative %: 59 %
Platelets: 273 10*3/uL (ref 150–400)
RBC: 4.11 MIL/uL (ref 3.87–5.11)
RDW: 16.1 % — ABNORMAL HIGH (ref 11.5–15.5)
WBC: 10 10*3/uL (ref 4.0–10.5)
nRBC: 0 % (ref 0.0–0.2)

## 2021-04-21 LAB — BASIC METABOLIC PANEL
Anion gap: 8 (ref 5–15)
BUN: 24 mg/dL — ABNORMAL HIGH (ref 8–23)
CO2: 23 mmol/L (ref 22–32)
Calcium: 8.5 mg/dL — ABNORMAL LOW (ref 8.9–10.3)
Chloride: 107 mmol/L (ref 98–111)
Creatinine, Ser: 1.23 mg/dL — ABNORMAL HIGH (ref 0.44–1.00)
GFR, Estimated: 46 mL/min — ABNORMAL LOW (ref >60)
Glucose, Bld: 127 mg/dL — ABNORMAL HIGH (ref 70–99)
Potassium: 4.5 mmol/L (ref 3.5–5.1)
Sodium: 138 mmol/L (ref 135–145)

## 2021-04-21 MED ORDER — POLYETHYLENE GLYCOL 3350 17 G PO PACK
17.0000 g | PACK | Freq: Every day | ORAL | Status: DC
  Administered 2021-04-22 – 2021-04-24 (×3): 17 g via ORAL
  Filled 2021-04-21 (×3): qty 1

## 2021-04-21 MED ORDER — DICLOFENAC SODIUM 1 % EX GEL
4.0000 g | Freq: Four times a day (QID) | CUTANEOUS | Status: DC
  Administered 2021-04-21 – 2021-04-24 (×13): 4 g via TOPICAL
  Filled 2021-04-21 (×2): qty 100

## 2021-04-21 NOTE — Progress Notes (Signed)
PROGRESS NOTE  Danielle [name]  DOB: [birth year]  PCP: Seward Carol, MD VWU:[id number]  DOA: 04/13/2021  LOS: 8 days  Hospital Day: 9  Chief Complaint  Patient presents with   Knee Pain    R    Brief narrative: Danielle [name] is a 75 y.o. 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 Patient presented to the ED from home by EMS on 11/17 with complaint of right knee pain for last 1 week, progressively worsening leading up to an episode of excruciating pain the previous night. In the ED, patient had a temperature of 100.5. Labs with WC count slightly elevated 12.1, CRP 1.7, creatinine 1.66 Right knee arthrocentesis was done  Patient was started on ceftriaxone 2 g. Admitted to hospitalist service. Orthopedics consulted.  Subjective: Patient was seen and examined this morning. Lying down in bed.  Not in distress.  No new symptoms.  Pain controlled. No bowel movement in 4 to 5 days. Noted that Prevena VAC was removed by orthopedics today.  Assessment/Plan: Septic arthritis of prosthetic right knee -Presented with 1 week history of progressively worsening prosthetic right knee -Seen by orthopedics.   -11/18, patient underwent irrigation and debridement and polyexchange of infected right knee arthroplasty. Noted no plan for prosthesis explantation at this time.  -Per ID recommendation from 11/21, ceftriaxone was switched to Ancef for 6 weeks (EOT 12/29).  PICC line placed on 11/23. Patient will need suppressive antibiotics following IV therapy.   -Patient to follow-up with ID at the end of the treatment. -11/25, orthopedics removed Prevena VAC dressing.  New Aquacel dressing was applied.  Recommendation is to leave it intact until follow-up for suture removal in 2 to 3 weeks  postop.   -Currently getting oxycodone 10 mg every 6 hours for pain control.  Continue to wean down.   Acute on chronic combined systolic and diastolic CHF  Essential hypertension -Last echo from December 2021 with EF 45 to 29%, grade 2 diastolic dysfunction, mildly elevated pulmonary artery systolic pressure. -Home meds include Coreg 25 mg twice daily, Imdur 30 mg daily, losartan 50 mg daily, torsemide 40 mg daily, Aldactone 25 mg daily -On admission, patient had bilateral pedal edema.  1 dose of Lasix was given.  Later held because of AKI. -Currently on Coreg, Imdur and losartan.  Continue to hold torsemide, Aldactone.  -Respiratory status stable. -Repeat blood work Architectural technologist.  AKI on CKD3b -Baseline creatinine less than 1.5.  Creatinine peaked at 1.86 on 11/19.  Gradually improving, 1.23 today.  -Continue to hold torsemide and Aldactone.  Continue to monitor renal function. Recent Labs    04/25/20 1228 04/13/21 0907 04/14/21 0551 04/15/21 0215 04/16/21 0758 04/17/21 0343 04/18/21 0222 04/19/21 0205 04/21/21 0250  BUN 29* 34* 32* 36* 40* 42* 33* 30* 24*  CREATININE 1.45* 1.66* 1.72* 1.86* 1.68* 1.46* 1.25* 1.27* 1.23*    Hyperkalemia -Potassium level improved after Lokelma.  Aldactone on hold. Recent Labs  Lab 04/16/21 0758 04/17/21 0343 04/18/21 0222 04/19/21 0205 04/21/21 0250  K 3.8 4.5 4.2 4.6 4.5    Paroxysmal A. fib -Sinus rhythm now, continue Coreg for rate control -Eliquis resumed.  Renally dosed per pharmacy  Type 2 diabetes mellitus -A1c 7.5 in 11/17 -Home meds include Semglee 75 units daily, Premeal insulin, Jardiance 10 mg daily,  -Currently on Semglee 30 units at  night and Jardiance with sliding scale insulin and Accu-Cheks. -Blood sugar level controlled.   -Continue Neurontin. Recent Labs  Lab 04/19/21 2113 04/20/21 0638 04/20/21 1637 04/20/21 2117 04/21/21 0835  GLUCAP 151* 135* 132* 142* 125*    Gout -Continue  allopurinol  Hypothyroidism -Synthroid  OSA -CPAP nightly  Mobility: PT eval appreciated Living condition: Lives at home Goals of care:   Code Status: Full Code  Nutritional status: Body mass index is 38.28 kg/m.     Diet:  Diet Order             Diet regular Room service appropriate? Yes; Fluid consistency: Thin  Diet effective now                  DVT prophylaxis: Place TED hose Start: 04/18/21 0806 apixaban (ELIQUIS) tablet 5 mg  Antimicrobials: IV Ancef Fluid: Off IV fluid Consultants: Orthopedics Family Communication: None at bedside  Status is: Inpatient  Remains inpatient appropriate because: Pending placement dispo: The patient is from: Home              Anticipated d/c is to: PT recommended SNF              Patient currently is medically stable to d/c.   Difficult to place patient No     Infusions:    ceFAZolin (ANCEF) IV 2 g (04/21/21 0559)    Scheduled Meds:  allopurinol  100 mg Oral Daily   apixaban  5 mg Oral BID   atorvastatin  80 mg Oral QHS   carvedilol  25 mg Oral BID WC   Chlorhexidine Gluconate Cloth  6 each Topical Daily   diclofenac Sodium  4 g Topical QID   empagliflozin  10 mg Oral Daily   ferrous sulfate  325 mg Oral Q breakfast   gabapentin  300 mg Oral BID   insulin aspart  0-15 Units Subcutaneous TID WC   insulin glargine-yfgn  30 Units Subcutaneous QHS   isosorbide mononitrate  30 mg Oral Daily   latanoprost  1 drop Both Eyes QHS   levothyroxine  75 mcg Oral Q0600   losartan  50 mg Oral Daily   [START ON 04/22/2021] polyethylene glycol  17 g Oral Daily   sodium chloride flush  10-40 mL Intracatheter Q12H    PRN meds: acetaminophen, bisacodyl, hydrALAZINE, morphine injection, ondansetron **OR** ondansetron (ZOFRAN) IV, oxyCODONE, senna-docusate, sodium chloride flush   Antimicrobials: Anti-infectives (From admission, onward)    Start     Dose/Rate Route Frequency Ordered Stop   04/18/21 0600  ceFAZolin (ANCEF)  IVPB 2g/100 mL premix        2 g 200 mL/hr over 30 Minutes Intravenous Every 8 hours 04/17/21 1020 05/26/21 2359   04/17/21 1200  vancomycin (VANCOREADY) IVPB 1250 mg/250 mL  Status:  Discontinued        1,250 mg 166.7 mL/hr over 90 Minutes Intravenous Every 48 hours 04/15/21 1424 04/17/21 1020   04/16/21 1700  vancomycin (VANCOREADY) IVPB 1250 mg/250 mL  Status:  Discontinued        1,250 mg 166.7 mL/hr over 90 Minutes Intravenous Every 48 hours 04/14/21 1615 04/15/21 1424   04/15/21 1515  vancomycin (VANCOREADY) IVPB 1500 mg/300 mL        1,500 mg 150 mL/hr over 120 Minutes Intravenous  Once 04/15/21 1424 04/15/21 1657   04/15/21 0600  ceFAZolin (ANCEF) IVPB 2g/100 mL premix  Status:  Discontinued        2 g 200 mL/hr  over 30 Minutes Intravenous On call to O.R. 04/14/21 1130 04/14/21 1136   04/14/21 1630  vancomycin (VANCOREADY) IVPB 2000 mg/400 mL  Status:  Discontinued        2,000 mg 200 mL/hr over 120 Minutes Intravenous  Once 04/14/21 1615 04/15/21 1424   04/14/21 1000  cefTRIAXone (ROCEPHIN) 2 g in sodium chloride 0.9 % 100 mL IVPB  Status:  Discontinued        2 g 200 mL/hr over 30 Minutes Intravenous Every 24 hours 04/13/21 1533 04/17/21 1020   04/13/21 1030  cefTRIAXone (ROCEPHIN) 2 g in sodium chloride 0.9 % 100 mL IVPB        2 g 200 mL/hr over 30 Minutes Intravenous  Once 04/13/21 1018 04/13/21 1158       Objective: Vitals:   04/21/21 0553 04/21/21 0841  BP: 103/83 (!) 154/60  Pulse: 71 73  Resp: 20 18  Temp: 99.2 F (37.3 C) 98.8 F (37.1 C)  SpO2: 96% 99%    Intake/Output Summary (Last 24 hours) at 04/21/2021 1023 Last data filed at 04/21/2021 0905 Gross per 24 hour  Intake 110 ml  Output 1200 ml  Net -1090 ml    Filed Weights   04/13/21 0756  Weight: 101.2 kg   Weight change:  Body mass index is 38.28 kg/m.   Physical Exam: General exam: elderly African-American female, pain controlled Skin: No rashes, lesions or ulcers. HEENT: Atraumatic,  normocephalic, no obvious bleeding Lungs: Clear to auscultation bilaterally CVS: Regular rate and rhythm, no murmur GI/Abd soft, nontender, nondistended, bowel sound present CNS: Alert, awake abound x3 Psychiatry: Mood appropriate Extremities: No pedal edema, no calf tenderness.  Right knee VAC off today.  Left upper extremity chronically swollen after breast cancer lymph node resection few years ago  Data Review: I have personally reviewed the laboratory data and studies available.  F/u labs ordered Unresulted Labs (From admission, onward)    None       Signed, Terrilee Croak, MD [facility] 04/21/2021

## 2021-04-21 NOTE — Progress Notes (Signed)
Physical Therapy Treatment Patient Details Name: Danielle [name] MRN: [id number] DOB: [birth year] Today's Date: 04/21/2021   History of Present Illness 75 y/o female presented to ED on 11/17 with cellulitis and concern for prosthetic joint infection. S/p fluid aspiration and I&D and polyexchange R TKA on 11/18. PMH: chronic combined systolic and diastolic CHF, CKD stage III, IDDM, HTN, bilateral knee replacement, breast cancer s/p chemo and lumpectomy, OSA on CPAP, hypothyroidism.    PT Comments    Pt admitted with above diagnosis. Pt was able to work with PT today just needed max encouragement and Mod assist of 2 persons. Able to get to chair with sTedy and perform some exercises as well. Limited by incr pain in right shoulder and right knee but feel that this will improve with incr activity.  Pt currently with functional limitations due to balance and endurance  deficits. Pt will benefit from skilled PT to increase their independence and safety with mobility to allow discharge to the venue listed below.      Recommendations for follow up therapy are one component of a multi-disciplinary discharge planning process, led by the attending physician.  Recommendations may be updated based on patient status, additional functional criteria and insurance authorization.  Follow Up Recommendations  Skilled nursing-short term rehab (<3 hours/day)     Assistance Recommended at Discharge Frequent or constant Supervision/Assistance  Equipment Recommendations  Rolling walker (2 wheels)    Recommendations for Other Services       Precautions / Restrictions Precautions Precautions: Fall Restrictions RLE Weight Bearing: Weight bearing as tolerated     Mobility  Bed Mobility Overal bed mobility: Needs Assistance Bed Mobility: Rolling;Sidelying to Sit Rolling: +2 for physical assistance;+2 for safety/equipment;Mod assist Sidelying to sit: Mod assist;+2 for physical assistance;HOB elevated Supine to  sit: Mod assist;HOB elevated     General bed mobility comments: Mod assist for LEs movement to EOB and for elevation of trunk. Incr time as pt resisting.  Used pad and cues for technique.    Transfers Overall transfer level: Needs assistance Equipment used: Rolling walker (2 wheels) Transfers: Sit to/from Stand;Bed to chair/wheelchair/BSC Sit to Stand: Mod assist;From elevated surface;+2 physical assistance;+2 safety/equipment           General transfer comment: Pt needed mod assist to come to stand and incr time to encourage pt to even try to stand.  Pt needed max coaxing and sat about 15 min before she would get into position on the STedy to attempt standing. Once she finally tried to stand, took incr time to get buttock pads behind her buttocks.  Moved pt to the recliner. Pt stated she needed to urinate and tried to get 3N1 but there was no bedpan.  End3ed up haivng her stand to [facility] and sit her in chair and got nursing to place purewick as the 3N1 was not going to work. Pt appreciative of being in chair. Transfer via Lift Equipment: Stedy  Ambulation/Gait                   Stairs             Wheelchair Mobility    Modified Rankin (Stroke Patients Only)       Balance Overall balance assessment: Needs assistance Sitting-balance support: Feet supported;Single extremity supported;Bilateral upper extremity supported Sitting balance-Leahy Scale: Fair     Standing balance support: Bilateral upper extremity supported;During functional activity Standing balance-Leahy Scale: Poor Standing balance comment: reliant on external support and use of STedy  Cognition Arousal/Alertness: Awake/alert Behavior During Therapy: WFL for tasks assessed/performed Overall Cognitive Status: Within Functional Limits for tasks assessed                                 General Comments: pt is in pain but finally agreed to some  moving        Exercises General Exercises - Lower Extremity Ankle Circles/Pumps: AROM;Both;10 reps;Supine Quad Sets: AROM;Both;10 reps;Supine Long Arc Quad: AROM;Both;10 reps;Seated Heel Slides: AROM;Both;5 reps;Supine    General Comments General comments (skin integrity, edema, etc.): Bil hands swollen as well as blisters noted on right UE.  Pt c/o right shoulder pain and MD is aware.      Pertinent Vitals/Pain Pain Assessment: Faces Faces Pain Scale: Hurts whole lot Pain Location: R knee and right shoulder Pain Descriptors / Indicators: Grimacing;Guarding;Sore Pain Intervention(s): Limited activity within patient's tolerance;Monitored during session;Repositioned;RN gave pain meds during session;Patient requesting pain meds-RN notified    Home Living                          Prior Function            PT Goals (current goals can now be found in the care plan section) Acute Rehab PT Goals Patient Stated Goal: decrease pain Progress towards PT goals: Progressing toward goals    Frequency    Min 3X/week      PT Plan Current plan remains appropriate    Co-evaluation              AM-PAC PT "6 Clicks" Mobility   Outcome Measure  Help needed turning from your back to your side while in a flat bed without using bedrails?: A Little Help needed moving from lying on your back to sitting on the side of a flat bed without using bedrails?: Total Help needed moving to and from a bed to a chair (including a wheelchair)?: Total Help needed standing up from a chair using your arms (e.g., wheelchair or bedside chair)?: Total Help needed to walk in hospital room?: Total Help needed climbing 3-5 steps with a railing? : Total 6 Click Score: 8    End of Session Equipment Utilized During Treatment: Gait belt Activity Tolerance: Patient limited by fatigue;Patient limited by pain Patient left: with call bell/phone within reach;in chair;with family/visitor  present;with chair alarm set Nurse Communication: Mobility status;Patient requests pain meds PT Visit Diagnosis: Unsteadiness on feet (R26.81);Muscle weakness (generalized) (M62.81);Difficulty in walking, not elsewhere classified (R26.2)     Time: 3762-8315 PT Time Calculation (min) (ACUTE ONLY): 44 min  Charges:  $Therapeutic Exercise: 8-22 mins $Therapeutic Activity: 23-37 mins                     Kayliana Codd M,PT Acute Rehab Services [phone] (pager)    Alvira Philips 04/21/2021, 3:53 PM

## 2021-04-21 NOTE — TOC Progression Note (Addendum)
Transition of Care [facility]) - Progression Note    Patient Details  Name: Danielle [name] MRN: [id number] Date of Birth: 1945/06/07  [facility] CM/SW Contact  Emeterio Reeve, [place] Phone Number: 04/21/2021, 12:23 PM  Clinical Narrative:     CSW received call from [place] for updated clinicals for [employer]. CSW faxed clinicals for review.   Expected Discharge Plan: [place] Barriers to Discharge: Continued Medical Work up, Ship broker, SNF Pending bed offer  Expected Discharge Plan and Services Expected Discharge Plan: [place] arrangements for the past 2 months: Apartment                                       Social Determinants of Health (SDOH) Interventions    Readmission Risk Interventions No flowsheet data found.  Emeterio Reeve, LCSW Clinical Social Worker

## 2021-04-21 NOTE — Progress Notes (Signed)
Patient reevaluated this evening regarding her right shoulder pain.  Range of motion seems moderately improved from this morning but still significantly limited.  Discussed concern for possible right shoulder septic arthritis especially of the recent right knee prosthetic joint infection.  She was agreeable to right shoulder aspiration.  Aspiration using posterior approach of the right shoulder was attempted but no fluid was obtained.  We will plan to continue monitor symptoms.  If right shoulder pain worsens consider IR for ultrasound or fluoro guided aspiration.

## 2021-04-21 NOTE — Progress Notes (Signed)
PT Cancellation Note  Patient Details Name: Danielle [name] MRN: [id number] DOB: 14-Nov-1945   Cancelled Treatment:    Reason Eval/Treat Not Completed: Other (comment) ("My right shoulder hurts so bad.  I just had some medicine. I am not getting up and moving right now." Pt declined PT.)   Alvira Philips 04/21/2021, 9:18 AM Chaniqua Brisby M,PT Acute Rehab Services [phone] (pager)

## 2021-04-21 NOTE — Progress Notes (Signed)
     Subjective:  Patient having increased pain R shoulder this morning. Denies new injury or trauma. Reports similar pain in previous weeks and having received a shoulder steroid injection in recent weeks with Dr. Mardelle Matte.  Objective:   VITALS:   Vitals:   04/20/21 0810 04/20/21 1129 04/20/21 2114 04/21/21 0553  BP: 139/67 (!) 101/52 103/61 103/83  Pulse: 71 68 69 71  Resp: 17 16 18 20   Temp: 98.5 F (36.9 C) 99 F (37.2 C) 98.5 F (36.9 C) 99.2 F (37.3 C)  TempSrc: Oral Oral Oral Oral  SpO2: 99% 95% 97% 96%  Weight:      Height:        Sensation intact distally Intact pulses distally Dorsiflexion/Plantar flexion intact Incision: incision C/D/I, no erythema or drainage, sutures intact Compartment soft  RUE No traumatic wounds, ecchymosis, or rash  Diffusely tender about R shoulder  Tolerates gentle PROM of the right shoulder, but severe pain with FE or abduction,, IR/ER tolerated No elbow or wrist effusion  Sens median, radial, ulnar intact  Motor AIN, PIN, IO intact  Radial pulse 2+, No significant edema    Lab Results  Component Value Date   WBC 10.0 04/21/2021   HGB 8.5 (L) 04/21/2021   HCT 27.9 (L) 04/21/2021   MCV 67.9 (L) 04/21/2021   PLT 273 04/21/2021   BMET    Component Value Date/Time   NA 138 04/21/2021 0250   K 4.5 04/21/2021 0250   CL 107 04/21/2021 0250   CO2 23 04/21/2021 0250   GLUCOSE 127 (H) 04/21/2021 0250   BUN 24 (H) 04/21/2021 0250   CREATININE 1.23 (H) 04/21/2021 0250   CALCIUM 8.5 (L) 04/21/2021 0250   GFRNONAA 46 (L) 04/21/2021 0250     Assessment/Plan: 7 Days Post-Op   Principal Problem:   Septic arthritis (HCC) Active Problems:   Acute systolic CHF (congestive heart failure) (HCC)  Acute R shoulder pain Voltaren gel ordered R shoulder xrays ordered - will follow up  Post op recs: WB: WBAT Abx: Cxs positive for MSSA, ID following recommending 6 weeks of cefazolin Imaging: PACU xrays DVT prophylaxis: Resume  Eliquis 2.5 twice daily starting postop day 1-2.  Then return to full anticoagulation dose 5 mg Eliquis twice daily starting postop day 3 Dressing: Prevena VAC dressing removed 11/25. New aquacell dressing applied. Leave intact until follow up for suture removal 2-3 weeks postop.  Ace wrap removed 11/22, thigh-high TED hose stockings applied by myself on 11/23. Okay to remove TED stockings at night. Drain:  Removed 11/21 Follow up: 2 weeks after surgery for a wound check with Dr. Zachery Dakins at [facility].  Address: [address] [place], [place], [place] [address]  Office Phone: [phone]    Radley Teston A Kittie Krizan 04/21/2021, 8:14 AM   Charlies Constable, MD Cell [phone]  Contact information:   [email or web address] 7am-5pm epic message Dr. Zachery Dakins, or call office for patient follow up: [phone] After hours and holidays please check [email or web address] for group call information for [facility]

## 2021-04-22 DIAGNOSIS — M009 Pyogenic arthritis, unspecified: Secondary | ICD-10-CM | POA: Diagnosis not present

## 2021-04-22 LAB — GLUCOSE, CAPILLARY
Glucose-Capillary: 98 mg/dL (ref 70–99)
Glucose-Capillary: 142 mg/dL — ABNORMAL HIGH (ref 70–99)
Glucose-Capillary: 140 mg/dL — ABNORMAL HIGH (ref 70–99)
Glucose-Capillary: 138 mg/dL — ABNORMAL HIGH (ref 70–99)

## 2021-04-22 NOTE — Progress Notes (Signed)
PROGRESS NOTE  Danielle [name]  DOB: 11/22/1945  PCP: Seward Carol, MD ZOX:[id number]  DOA: 04/13/2021  LOS: 9 days  Hospital Day: 36  Chief Complaint  Patient presents with   Knee Pain    R    Brief narrative: Danielle [name] is a 75 y.o. 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 Patient presented to the ED from home by EMS on 11/17 with complaint of right knee pain for last 1 week, progressively worsening leading up to an episode of excruciating pain the previous night. In the ED, patient had a temperature of 100.5. Labs with WC count slightly elevated 12.1, CRP 1.7, creatinine 1.66 Right knee arthrocentesis was done  Patient was started on ceftriaxone 2 g. Admitted to hospitalist service. Orthopedics consulted.  Subjective: Patient was seen and examined at her bedside.  She reports persistent right shoulder pain.  Orthopedic surgery following.  Assessment/Plan: Septic arthritis of prosthetic right knee -Presented with 1 week history of progressively worsening prosthetic right knee -Seen by orthopedics.   -11/18, patient underwent irrigation and debridement and polyexchange of infected right knee arthroplasty. Noted no plan for prosthesis explantation at this time.  -Per ID recommendation from 11/21, ceftriaxone was switched to Ancef for 6 weeks (EOT 12/29).  PICC line placed on 11/23. Patient will need suppressive antibiotics following IV therapy.   -Patient to follow-up with ID at the end of the treatment. -11/25, orthopedics removed Prevena VAC dressing.  New Aquacel dressing was applied.  Recommendation is to leave it intact until follow-up for suture removal in 2 to 3 weeks postop.   -Continue oxycodone 10 mg every 6 hours for pain control.  Continue to wean down.    Acute on chronic combined systolic and diastolic CHF  Essential hypertension -Last echo from December 2021 with EF 45 to 81%, grade 2 diastolic dysfunction, mildly elevated pulmonary artery systolic pressure. -Home meds include Coreg 25 mg twice daily, Imdur 30 mg daily, losartan 50 mg daily, torsemide 40 mg daily, Aldactone 25 mg daily -On admission, patient had bilateral pedal edema.  1 dose of Lasix was given.  Later held because of AKI. -Currently on Coreg, Imdur and losartan.  Continue to hold torsemide, Aldactone.  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   Creatinine appears to be close to baseline 1.23 with GFR 46. Creatinine peaked at 1.86 on 11/19.  Gradually improving, 1.23 today.  -Continue to hold torsemide and Aldactone.  Continue to monitor renal function. Continue to avoid nephrotoxic agents and hypotension. Recent Labs    04/25/20 1228 04/13/21 0907 04/14/21 0551 04/15/21 0215 04/16/21 0758 04/17/21 0343 04/18/21 0222 04/19/21 0205 04/21/21 0250  BUN 29* 34* 32* 36* 40* 42* 33* 30* 24*  CREATININE 1.45* 1.66* 1.72* 1.86* 1.68* 1.46* 1.25* 1.27* 1.23*   Hyperkalemia -Potassium level improved after Lokelma.  Aldactone on hold. Recent Labs  Lab 04/16/21 0758 04/17/21 0343 04/18/21 0222 04/19/21 0205 04/21/21 0250  K 3.8 4.5 4.2 4.6 4.5   Paroxysmal A. fib -Sinus rhythm now, continue Coreg for rate control -Continue home Eliquis.  Renally dosed per pharmacy  Type 2 diabetes mellitus -A1c 7.5 in 11/17 -Home meds include Semglee 75 units daily, Premeal insulin, Jardiance 10 mg daily,  -Currently on Semglee 30 units at night and Jardiance with sliding scale  insulin and Accu-Cheks. -Blood sugar level controlled.   -Continue Neurontin. Recent Labs  Lab 04/21/21 1114 04/21/21 1601 04/21/21 2038 04/22/21 0703 04/22/21 1211  GLUCAP 112* 128* 122* 98 142*   Gout Continue  allopurinol  Hypothyroidism Continue Synthroid  OSA Continue CPAP nightly  Mobility: PT eval appreciated Living condition: Lives at home Goals of care:   Code Status: Full Code  Nutritional status: Body mass index is 38.28 kg/m.     Diet:  Diet Order             Diet regular Room service appropriate? Yes; Fluid consistency: Thin  Diet effective now                  DVT prophylaxis: Place TED hose Start: 04/18/21 0806 apixaban (ELIQUIS) tablet 5 mg  Antimicrobials: IV Ancef Fluid: Off IV fluid Consultants: Orthopedics Family Communication: None at bedside  Status is: Inpatient  Remains inpatient appropriate because: Pending placement dispo: The patient is from: Home              Anticipated d/c is to: PT recommended SNF              Patient currently is medically stable to d/c.   Difficult to place patient No     Infusions:    ceFAZolin (ANCEF) IV 2 g (04/22/21 1255)    Scheduled Meds:  allopurinol  100 mg Oral Daily   apixaban  5 mg Oral BID   atorvastatin  80 mg Oral QHS   carvedilol  25 mg Oral BID WC   Chlorhexidine Gluconate Cloth  6 each Topical Daily   diclofenac Sodium  4 g Topical QID   empagliflozin  10 mg Oral Daily   ferrous sulfate  325 mg Oral Q breakfast   gabapentin  300 mg Oral BID   insulin aspart  0-15 Units Subcutaneous TID WC   insulin glargine-yfgn  30 Units Subcutaneous QHS   isosorbide mononitrate  30 mg Oral Daily   latanoprost  1 drop Both Eyes QHS   levothyroxine  75 mcg Oral Q0600   losartan  50 mg Oral Daily   polyethylene glycol  17 g Oral Daily   sodium chloride flush  10-40 mL Intracatheter Q12H    PRN meds: acetaminophen, bisacodyl, hydrALAZINE, morphine injection, ondansetron **OR** ondansetron (ZOFRAN) IV, oxyCODONE, senna-docusate, sodium chloride flush   Antimicrobials: Anti-infectives (From admission, onward)    Start     Dose/Rate Route Frequency Ordered Stop   04/18/21 0600  ceFAZolin (ANCEF) IVPB  2g/100 mL premix        2 g 200 mL/hr over 30 Minutes Intravenous Every 8 hours 04/17/21 1020 05/26/21 2359   04/17/21 1200  vancomycin (VANCOREADY) IVPB 1250 mg/250 mL  Status:  Discontinued        1,250 mg 166.7 mL/hr over 90 Minutes Intravenous Every 48 hours 04/15/21 1424 04/17/21 1020   04/16/21 1700  vancomycin (VANCOREADY) IVPB 1250 mg/250 mL  Status:  Discontinued        1,250 mg 166.7 mL/hr over 90 Minutes Intravenous Every 48 hours 04/14/21 1615 04/15/21 1424   04/15/21 1515  vancomycin (VANCOREADY) IVPB 1500 mg/300 mL        1,500 mg 150 mL/hr over 120 Minutes Intravenous  Once 04/15/21 1424 04/15/21 1657   04/15/21 0600  ceFAZolin (ANCEF) IVPB 2g/100 mL premix  Status:  Discontinued        2 g 200 mL/hr over 30 Minutes Intravenous On call to O.R.  04/14/21 1130 04/14/21 1136   04/14/21 1630  vancomycin (VANCOREADY) IVPB 2000 mg/400 mL  Status:  Discontinued        2,000 mg 200 mL/hr over 120 Minutes Intravenous  Once 04/14/21 1615 04/15/21 1424   04/14/21 1000  cefTRIAXone (ROCEPHIN) 2 g in sodium chloride 0.9 % 100 mL IVPB  Status:  Discontinued        2 g 200 mL/hr over 30 Minutes Intravenous Every 24 hours 04/13/21 1533 04/17/21 1020   04/13/21 1030  cefTRIAXone (ROCEPHIN) 2 g in sodium chloride 0.9 % 100 mL IVPB        2 g 200 mL/hr over 30 Minutes Intravenous  Once 04/13/21 1018 04/13/21 1158       Objective: Vitals:   04/22/21 0444 04/22/21 0925  BP: 140/64 (!) 106/50  Pulse: 71 74  Resp: 20 17  Temp: 99.4 F (37.4 C) 98.1 F (36.7 C)  SpO2: 96% 100%    Intake/Output Summary (Last 24 hours) at 04/22/2021 1433 Last data filed at 04/22/2021 0527 Gross per 24 hour  Intake --  Output 801 ml  Net -801 ml   Filed Weights   04/13/21 0756  Weight: 101.2 kg   Weight change:  Body mass index is 38.28 kg/m.   Physical Exam: General exam: Well-developed well-nourished in no acute distress.  She is alert and oriented x3.   Skin: No rashes or lesions  noted. HEENT: Atraumatic normocephalic. Lungs: Clear to auscultation with no wheezes or rales. CVS: Regular rate and rhythm no rubs or gallops.   GI/Abd soft nontender normal bowel sounds present. CNS: Alert and awake. Psychiatry: Mood is appropriate for condition and setting. Extremities: Trace edema in all 4 extremities.   Data Review: I have personally reviewed the laboratory data and studies available.  F/u labs ordered Unresulted Labs (From admission, onward)    None       Signed, Kayleen Memos, MD [facility] 04/22/2021

## 2021-04-22 NOTE — TOC Progression Note (Signed)
Transition of Care [facility]) - Progression Note    Patient Details  Name: Danielle [name] MRN: [id number] Date of Birth: 28-Feb-1946  Transition of Care [facility]) CM/SW Contact  Elliot Gurney [place], [place] Phone Number: [phone] 04/22/2021, 2:57 PM  Clinical Narrative:    Updated clinicals faxed to [place]   [facility], LCSW Transition of Care [phone]    Expected Discharge Plan: [place] Barriers to Discharge: Continued Medical Work up, [place], SNF Pending bed offer  Expected Discharge Plan and Services Expected Discharge Plan: [place] arrangements for the past 2 months: Apartment                                       Social Determinants of Health (SDOH) Interventions    Readmission Risk Interventions No flowsheet data found.

## 2021-04-22 NOTE — Progress Notes (Signed)
Mobility Specialist Progress Note   04/22/21 1700  Mobility  Activity Dangled on edge of bed  Level of Assistance Moderate assist, patient does 50-74%  Assistive Device None  Mobility Sit up in bed/chair position for meals  Mobility Response Tolerated well  Mobility performed by Mobility specialist  $Mobility charge 1 Mobility   Received pt in bed who just recently took meds and agreeable to mobility. Min A supine to EOB, needs support in upper body to get to EOB but maneuvers LE well this session. While sitting EOB pt participated in LE exercises and passive exercises for the R shoulder. Returned BTB in the chair position for dinner, call bell by side and all needs met.     Holland Falling Mobility Specialist Phone Number [phone]

## 2021-04-22 NOTE — Plan of Care (Signed)
  Problem: Education: Goal: Knowledge of General Education information will improve Description Including pain rating scale, medication(s)/side effects and non-pharmacologic comfort measures Outcome: Progressing   Problem: Health Behavior/Discharge Planning: Goal: Ability to manage health-related needs will improve Outcome: Progressing   

## 2021-04-22 NOTE — Progress Notes (Signed)
     Subjective:  Patient is doing much better today. Pain better controlled. R shoulder pain much improved. Denies distal n/t.  Objective:   VITALS:   Vitals:   04/21/21 2000 04/21/21 2216 04/22/21 0444 04/22/21 0925  BP: 117/68 (!) 146/62 140/64 (!) 106/50  Pulse: (!) 137 66 71 74  Resp: 19 17 20 17   Temp: 98.9 F (37.2 C) 99.5 F (37.5 C) 99.4 F (37.4 C) 98.1 F (36.7 C)  TempSrc:   Oral Oral  SpO2: 100% 100% 96% 100%  Weight:      Height:        Sensation intact distally Intact pulses distally Dorsiflexion/Plantar flexion intact Incision: incision C/D/I, no erythema or drainage, sutures intact Compartment soft  RUE No traumatic wounds, ecchymosis, or rash  Minimally tender about R shoulder  Tolerates near full PROM of the shoulder with minimal discomfort No elbow or wrist effusion  Sens median, radial, ulnar intact  Motor AIN, PIN, IO intact  Radial pulse 2+, No significant edema    Lab Results  Component Value Date   WBC 10.0 04/21/2021   HGB 8.5 (L) 04/21/2021   HCT 27.9 (L) 04/21/2021   MCV 67.9 (L) 04/21/2021   PLT 273 04/21/2021   BMET    Component Value Date/Time   NA 138 04/21/2021 0250   K 4.5 04/21/2021 0250   CL 107 04/21/2021 0250   CO2 23 04/21/2021 0250   GLUCOSE 127 (H) 04/21/2021 0250   BUN 24 (H) 04/21/2021 0250   CREATININE 1.23 (H) 04/21/2021 0250   CALCIUM 8.5 (L) 04/21/2021 0250   GFRNONAA 46 (L) 04/21/2021 0250     Assessment/Plan: 8 Days Post-Op   Principal Problem:   Septic arthritis (HCC) Active Problems:   Acute systolic CHF (congestive heart failure) (HCC)   Acute R shoulder pain - much improved 11/26 Voltaren gel ordered R shoulder xrays ordered - negative  Post op recs: WB: WBAT Abx: Cxs positive for MSSA, ID following recommending 6 weeks of cefazolin Imaging: PACU xrays DVT prophylaxis: Resume Eliquis 2.5 twice daily starting postop day 1-2.  Then return to full anticoagulation dose 5 mg Eliquis twice  daily starting postop day 3 Dressing: Prevena VAC dressing removed 11/25. New aquacell dressing applied. Leave intact until follow up for suture removal 2-3 weeks postop.  Ace wrap removed 11/22, thigh-high TED hose stockings applied by myself on 11/23. Okay to remove TED stockings at night. Drain:  Removed 11/21 Follow up: 2 weeks after surgery for a wound check with Dr. Zachery Dakins at [facility].  Address: [address] [place], [place], [place] [address]  Office Phone: [phone]    [place] 04/22/2021, 11:37 AM   Charlies Constable, MD Cell [phone]  Contact information:   [id number] 7am-5pm epic message Dr. Zachery Dakins, or call office for patient follow up: [phone] After hours and holidays please check [email or web address] for group call information for [facility]

## 2021-04-23 DIAGNOSIS — M009 Pyogenic arthritis, unspecified: Secondary | ICD-10-CM | POA: Diagnosis not present

## 2021-04-23 LAB — GLUCOSE, CAPILLARY
Glucose-Capillary: 109 mg/dL — ABNORMAL HIGH (ref 70–99)
Glucose-Capillary: 129 mg/dL — ABNORMAL HIGH (ref 70–99)
Glucose-Capillary: 109 mg/dL — ABNORMAL HIGH (ref 70–99)
Glucose-Capillary: 136 mg/dL — ABNORMAL HIGH (ref 70–99)

## 2021-04-23 LAB — IRON AND TIBC
Iron: 23 ug/dL — ABNORMAL LOW (ref 28–170)
Saturation Ratios: 10 % — ABNORMAL LOW (ref 10.4–31.8)
TIBC: 221 ug/dL — ABNORMAL LOW (ref 250–450)
UIBC: 198 ug/dL

## 2021-04-23 LAB — RETICULOCYTES
Immature Retic Fract: 33.5 % — ABNORMAL HIGH (ref 2.3–15.9)
RBC.: 3.56 MIL/uL — ABNORMAL LOW (ref 3.87–5.11)
Retic Count, Absolute: 49.8 10*3/uL (ref 19.0–186.0)
Retic Ct Pct: 1.4 % (ref 0.4–3.1)

## 2021-04-23 LAB — FERRITIN: Ferritin: 464 ng/mL — ABNORMAL HIGH (ref 11–307)

## 2021-04-23 NOTE — Progress Notes (Signed)
Mobility Specialist Progress Note   04/23/21 1847  Mobility  Activity Transferred:  Chair to bed;Transferred to/from [facility]  Level of Assistance Minimal assist, patient does 75% or more  Assistive Device Stedy  RLE Weight Bearing WBAT  Mobility Response Tolerated well  Mobility performed by Mobility specialist;Nurse (+2 For IV Pole)  $Mobility charge 1 Mobility   Received pt in chair needing to use BR and ready to go to bed. C/o slight pain in R knee but did not inc through transfers. Successful void in BR, returned back to bed w/ call bell in reach and needs met.  Holland Falling Mobility Specialist Phone Number [phone]

## 2021-04-23 NOTE — Progress Notes (Signed)
Placed patient on CPAP for the night via auto-mode.  

## 2021-04-23 NOTE — Progress Notes (Addendum)
Mobility Specialist Progress Note   04/23/21 1330  Mobility  Activity Transferred to/from [facility];Transferred:  Bed to chair (Transferred to Bathroom*)  Level of Assistance Minimal assist, patient does 75% or more  Assistive Device Stedy  RLE Weight Bearing WBAT  Minutes Stood 2 minutes  Mobility Sit up in bed/chair position for meals;Out of bed for toileting  Mobility Response Tolerated well  Mobility performed by Mobility specialist  Bed Position Chair  $Mobility charge 1 Mobility   Received pt in bed just receiving pain meds and needing to use BR. Min A for LE for bed mobility, Min A STS w/ stedy, able to stand for ~78min w/o complaint. Unsuccessful BM and returned to the chair w/ call bell in reach, lunch tray in front and chair alarm on.   Holland Falling Mobility Specialist Phone Number [phone]

## 2021-04-23 NOTE — Progress Notes (Signed)
PROGRESS NOTE  Danielle [name]  DOB: 09/14/1945  PCP: Seward Carol, MD PZW:[id number]  DOA: 04/13/2021  LOS: 10 days  Hospital Day: 45  Chief Complaint  Patient presents with   Knee Pain    R    Brief narrative: Danielle [name] is a 75 y.o. female with PMH significant for obesity, OSA on CPAP, DM2, HTN, chronic combined systolic and diastolic CHF, CKD stage III, gout, bilateral knee replacement 3 years ago, breast cancer status post chemo lumpectomy, hypothyroidism who presented to the ED from home by EMS on 11/17 with complaint of right knee pain for last 1 week, progressively worsening leading up to an episode of excruciating pain the night prior to admission.  In the ED, febrile with T-max 100.5. In the ED, patient had a temperature of 100.5, leukocytosis.  Right knee arthrocentesis revealed staph aureus.  She was initially received Rocephin 2 g daily, later switched to IV Ancef for pansensitive staph aureus septic arthritis.  Followed by orthopedic surgery.  04/23/2021: Patient was seen at bedside.  She has no new complaints.  There were no acute events overnight.  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 -04/14/21, patient underwent irrigation and debridement and polyexchange of infected right knee arthroplasty. Noted no plan for prosthesis explantation at this time.  Body fluid obtained on 04/14/2021 positive for Staphylococcus aureus, initially on Rocephin, she was switched to Ancef 04/18/2021 for pansensitive staph septic arthritis -Per ID recommendation from 11/21, ceftriaxone was switched to Ancef for 6 weeks (EOT 12/29).  PICC line placed on 11/23. Patient will need suppressive antibiotics following IV therapy.   -Patient to follow-up with ID at the end of the treatment. -11/25, orthopedics removed Prevena VAC dressing.  New Aquacel dressing was applied.  Recommendation  is to leave it intact until follow-up for suture removal in 2 to 3 weeks postop.   Continue oxycodone 10 mg every 6 hours for pain control.     Acute on chronic combined systolic and diastolic CHF  Essential hypertension -Last echo from December 2021 with EF 45 to 42%, grade 2 diastolic dysfunction, mildly elevated pulmonary artery systolic pressure. -Home meds include Coreg 25 mg twice daily, Imdur 30 mg daily, losartan 50 mg daily, torsemide 40 mg daily, Aldactone 25 mg daily -On admission, patient had bilateral pedal edema.  1 dose of Lasix was given.  Later held because of AKI. -Currently on Coreg, Imdur and losartan.   Continue to hold torsemide, Aldactone.  -Respiratory status stable Repeat BMP in the morning to reassess renal function and electrolytes.  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   Creatinine appears to be close to baseline 1.23 with GFR 46. Creatinine peaked at 1.86 on 11/19.  Gradually improving, 1.23 today.  -Continue to hold torsemide and Aldactone.  Continue to monitor renal function. Continue to avoid nephrotoxic agents and hypotension. Follow BMP, 04/24/2021 Recent Labs    04/25/20 1228 04/13/21 0907 04/14/21 0551 04/15/21 0215 04/16/21 0758 04/17/21 0343 04/18/21 0222 04/19/21 0205 04/21/21 0250  BUN 29* 34* 32* 36* 40* 42* 33* 30* 24*  CREATININE 1.45* 1.66* 1.72* 1.86* 1.68* 1.46* 1.25* 1.27* 1.23*   Resolved, posttreatment, hyperkalemia -Potassium level improved after Lokelma.  Aldactone on hold. Recent Labs  Lab 04/17/21 0343 04/18/21 0222 04/19/21 0205 04/21/21 0250  K 4.5 4.2 4.6 4.5   Anemia of chronic disease/chronic microcytic anemia/iron deficiency anemia Hemoglobin 8.5 No overt bleeding Monitor  H&H Obtain iron studies Repeat CBC in the morning to reassess anemia. Continue iron replacement  Paroxysmal A. fib -Sinus rhythm now, continue Coreg for rate control -Continue home Eliquis.  Renally dosed per  pharmacy  Type 2 diabetes mellitus with diabetic polyneuropathy -A1c 7.5 in 11/17 -Home meds include Semglee 75 units daily, Premeal insulin, Jardiance 10 mg daily,  -Currently on Semglee 30 units at night and Jardiance with sliding scale insulin and Accu-Cheks. -Blood sugar level controlled.   Continue Neurontin. Recent Labs  Lab 04/22/21 1211 04/22/21 1611 04/22/21 2112 04/23/21 0741 04/23/21 1140  GLUCAP 142* 140* 138* 109* 129*   Gout Continue allopurinol  Hypothyroidism Continue Synthroid  OSA Continue CPAP nightly  Mobility: PT eval appreciated Living condition: Lives at home Goals of care:   Code Status: Full Code  Nutritional status: Body mass index is 38.28 kg/m.     Diet:  Diet Order             Diet regular Room service appropriate? Yes; Fluid consistency: Thin  Diet effective now                  DVT prophylaxis: Place TED hose Start: 04/18/21 0806 apixaban (ELIQUIS) tablet 5 mg  Antimicrobials: IV Ancef Fluid: Off IV fluid Consultants: Orthopedics Family Communication: None at bedside  Status is: Inpatient  Remains inpatient appropriate because: Pending placement dispo: The patient is from: Home              Anticipated d/c is to: PT recommended SNF              Patient currently is medically stable to d/c.   Difficult to place patient No     Infusions:    ceFAZolin (ANCEF) IV 2 g (04/23/21 0537)    Scheduled Meds:  allopurinol  100 mg Oral Daily   apixaban  5 mg Oral BID   atorvastatin  80 mg Oral QHS   carvedilol  25 mg Oral BID WC   Chlorhexidine Gluconate Cloth  6 each Topical Daily   diclofenac Sodium  4 g Topical QID   empagliflozin  10 mg Oral Daily   ferrous sulfate  325 mg Oral Q breakfast   gabapentin  300 mg Oral BID   insulin aspart  0-15 Units Subcutaneous TID WC   insulin glargine-yfgn  30 Units Subcutaneous QHS   isosorbide mononitrate  30 mg Oral Daily   latanoprost  1 drop Both Eyes QHS   levothyroxine  75  mcg Oral Q0600   losartan  50 mg Oral Daily   polyethylene glycol  17 g Oral Daily   sodium chloride flush  10-40 mL Intracatheter Q12H    PRN meds: acetaminophen, bisacodyl, hydrALAZINE, morphine injection, ondansetron **OR** ondansetron (ZOFRAN) IV, oxyCODONE, senna-docusate, sodium chloride flush   Antimicrobials: Anti-infectives (From admission, onward)    Start     Dose/Rate Route Frequency Ordered Stop   04/18/21 0600  ceFAZolin (ANCEF) IVPB 2g/100 mL premix        2 g 200 mL/hr over 30 Minutes Intravenous Every 8 hours 04/17/21 1020 05/26/21 2359   04/17/21 1200  vancomycin (VANCOREADY) IVPB 1250 mg/250 mL  Status:  Discontinued        1,250 mg 166.7 mL/hr over 90 Minutes Intravenous Every 48 hours 04/15/21 1424 04/17/21 1020   04/16/21 1700  vancomycin (VANCOREADY) IVPB 1250 mg/250 mL  Status:  Discontinued        1,250 mg 166.7 mL/hr over 90 Minutes Intravenous  Every 48 hours 04/14/21 1615 04/15/21 1424   04/15/21 1515  vancomycin (VANCOREADY) IVPB 1500 mg/300 mL        1,500 mg 150 mL/hr over 120 Minutes Intravenous  Once 04/15/21 1424 04/15/21 1657   04/15/21 0600  ceFAZolin (ANCEF) IVPB 2g/100 mL premix  Status:  Discontinued        2 g 200 mL/hr over 30 Minutes Intravenous On call to O.R. 04/14/21 1130 04/14/21 1136   04/14/21 1630  vancomycin (VANCOREADY) IVPB 2000 mg/400 mL  Status:  Discontinued        2,000 mg 200 mL/hr over 120 Minutes Intravenous  Once 04/14/21 1615 04/15/21 1424   04/14/21 1000  cefTRIAXone (ROCEPHIN) 2 g in sodium chloride 0.9 % 100 mL IVPB  Status:  Discontinued        2 g 200 mL/hr over 30 Minutes Intravenous Every 24 hours 04/13/21 1533 04/17/21 1020   04/13/21 1030  cefTRIAXone (ROCEPHIN) 2 g in sodium chloride 0.9 % 100 mL IVPB        2 g 200 mL/hr over 30 Minutes Intravenous  Once 04/13/21 1018 04/13/21 1158       Objective: Vitals:   04/23/21 0740 04/23/21 1351  BP: (!) 143/53 127/76  Pulse: 68 64  Resp: 17 18  Temp: 98.1 F  (36.7 C) 98.5 F (36.9 C)  SpO2: 99% 95%    Intake/Output Summary (Last 24 hours) at 04/23/2021 1522 Last data filed at 04/23/2021 0900 Gross per 24 hour  Intake --  Output 1300 ml  Net -1300 ml   Filed Weights   04/13/21 0756  Weight: 101.2 kg   Weight change:  Body mass index is 38.28 kg/m.   Physical Exam: General exam: Well-developed well-nourished in no acute distress.  She is alert and oriented x3.   Skin: No rashes or ulcerative lesions noted.   HEENT: Atraumatic and normocephalic. Lungs: Clear to auscultation with no wheezes or rales. CVS: Regular rate and rhythm no rubs or gallops. GI/Abd soft nontender normal bowel sounds present. CNS: Alert and awake.   Psychiatry: Mood is appropriate for condition and setting. Extremities: Trace lower extremity edema bilaterally.  Data Review: I have personally reviewed the laboratory data and studies available.  F/u labs ordered Unresulted Labs (From admission, onward)    None       Signed, Kayleen Memos, MD [facility] 04/23/2021

## 2021-04-24 DIAGNOSIS — M00061 Staphylococcal arthritis, right knee: Secondary | ICD-10-CM | POA: Diagnosis not present

## 2021-04-24 DIAGNOSIS — T454X5A Adverse effect of iron and its compounds, initial encounter: Secondary | ICD-10-CM | POA: Diagnosis not present

## 2021-04-24 DIAGNOSIS — E118 Type 2 diabetes mellitus with unspecified complications: Secondary | ICD-10-CM | POA: Diagnosis not present

## 2021-04-24 DIAGNOSIS — E669 Obesity, unspecified: Secondary | ICD-10-CM | POA: Diagnosis not present

## 2021-04-24 DIAGNOSIS — Z713 Dietary counseling and surveillance: Secondary | ICD-10-CM | POA: Diagnosis not present

## 2021-04-24 DIAGNOSIS — N189 Chronic kidney disease, unspecified: Secondary | ICD-10-CM | POA: Diagnosis not present

## 2021-04-24 DIAGNOSIS — I890 Lymphedema, not elsewhere classified: Secondary | ICD-10-CM | POA: Diagnosis not present

## 2021-04-24 DIAGNOSIS — J310 Chronic rhinitis: Secondary | ICD-10-CM | POA: Diagnosis not present

## 2021-04-24 DIAGNOSIS — Z743 Need for continuous supervision: Secondary | ICD-10-CM | POA: Diagnosis not present

## 2021-04-24 DIAGNOSIS — Z96651 Presence of right artificial knee joint: Secondary | ICD-10-CM | POA: Diagnosis not present

## 2021-04-24 DIAGNOSIS — M1A0390 Idiopathic chronic gout, unspecified wrist, without tophus (tophi): Secondary | ICD-10-CM | POA: Diagnosis not present

## 2021-04-24 DIAGNOSIS — M109 Gout, unspecified: Secondary | ICD-10-CM | POA: Diagnosis not present

## 2021-04-24 DIAGNOSIS — D509 Iron deficiency anemia, unspecified: Secondary | ICD-10-CM | POA: Diagnosis not present

## 2021-04-24 DIAGNOSIS — D631 Anemia in chronic kidney disease: Secondary | ICD-10-CM | POA: Diagnosis not present

## 2021-04-24 DIAGNOSIS — I2510 Atherosclerotic heart disease of native coronary artery without angina pectoris: Secondary | ICD-10-CM | POA: Diagnosis not present

## 2021-04-24 DIAGNOSIS — N183 Chronic kidney disease, stage 3 unspecified: Secondary | ICD-10-CM | POA: Diagnosis not present

## 2021-04-24 DIAGNOSIS — N179 Acute kidney failure, unspecified: Secondary | ICD-10-CM | POA: Diagnosis not present

## 2021-04-24 DIAGNOSIS — I5040 Unspecified combined systolic (congestive) and diastolic (congestive) heart failure: Secondary | ICD-10-CM | POA: Diagnosis not present

## 2021-04-24 DIAGNOSIS — M009 Pyogenic arthritis, unspecified: Secondary | ICD-10-CM | POA: Diagnosis not present

## 2021-04-24 DIAGNOSIS — Z0189 Encounter for other specified special examinations: Secondary | ICD-10-CM | POA: Diagnosis not present

## 2021-04-24 DIAGNOSIS — E039 Hypothyroidism, unspecified: Secondary | ICD-10-CM | POA: Diagnosis not present

## 2021-04-24 DIAGNOSIS — K5903 Drug induced constipation: Secondary | ICD-10-CM | POA: Diagnosis not present

## 2021-04-24 DIAGNOSIS — I110 Hypertensive heart disease with heart failure: Secondary | ICD-10-CM | POA: Diagnosis not present

## 2021-04-24 DIAGNOSIS — Z853 Personal history of malignant neoplasm of breast: Secondary | ICD-10-CM | POA: Diagnosis not present

## 2021-04-24 DIAGNOSIS — G4733 Obstructive sleep apnea (adult) (pediatric): Secondary | ICD-10-CM | POA: Diagnosis not present

## 2021-04-24 DIAGNOSIS — M150 Primary generalized (osteo)arthritis: Secondary | ICD-10-CM | POA: Diagnosis not present

## 2021-04-24 DIAGNOSIS — E611 Iron deficiency: Secondary | ICD-10-CM | POA: Diagnosis not present

## 2021-04-24 DIAGNOSIS — M25561 Pain in right knee: Secondary | ICD-10-CM | POA: Diagnosis not present

## 2021-04-24 DIAGNOSIS — G8929 Other chronic pain: Secondary | ICD-10-CM | POA: Diagnosis not present

## 2021-04-24 DIAGNOSIS — Z9889 Other specified postprocedural states: Secondary | ICD-10-CM | POA: Diagnosis not present

## 2021-04-24 DIAGNOSIS — G4709 Other insomnia: Secondary | ICD-10-CM | POA: Diagnosis not present

## 2021-04-24 LAB — CBC
HCT: 23.4 % — ABNORMAL LOW (ref 36.0–46.0)
Hemoglobin: 7 g/dL — ABNORMAL LOW (ref 12.0–15.0)
MCH: 20.7 pg — ABNORMAL LOW (ref 26.0–34.0)
MCHC: 29.9 g/dL — ABNORMAL LOW (ref 30.0–36.0)
MCV: 69.2 fL — ABNORMAL LOW (ref 80.0–100.0)
Platelets: 344 10*3/uL (ref 150–400)
RBC: 3.38 MIL/uL — ABNORMAL LOW (ref 3.87–5.11)
RDW: 15.8 % — ABNORMAL HIGH (ref 11.5–15.5)
WBC: 7.8 10*3/uL (ref 4.0–10.5)
nRBC: 0 % (ref 0.0–0.2)

## 2021-04-24 LAB — BASIC METABOLIC PANEL
Anion gap: 5 (ref 5–15)
BUN: 18 mg/dL (ref 8–23)
CO2: 26 mmol/L (ref 22–32)
Calcium: 8.3 mg/dL — ABNORMAL LOW (ref 8.9–10.3)
Chloride: 105 mmol/L (ref 98–111)
Creatinine, Ser: 1.17 mg/dL — ABNORMAL HIGH (ref 0.44–1.00)
GFR, Estimated: 49 mL/min — ABNORMAL LOW (ref >60)
Glucose, Bld: 112 mg/dL — ABNORMAL HIGH (ref 70–99)
Potassium: 4.1 mmol/L (ref 3.5–5.1)
Sodium: 136 mmol/L (ref 135–145)

## 2021-04-24 LAB — PHOSPHORUS: Phosphorus: 2.9 mg/dL (ref 2.5–4.6)

## 2021-04-24 LAB — RESP PANEL BY RT-PCR (FLU A&B, COVID) ARPGX2
Influenza A by PCR: NEGATIVE
Influenza B by PCR: NEGATIVE
SARS Coronavirus 2 by RT PCR: NEGATIVE

## 2021-04-24 LAB — MAGNESIUM: Magnesium: 2.6 mg/dL — ABNORMAL HIGH (ref 1.7–2.4)

## 2021-04-24 LAB — GLUCOSE, CAPILLARY
Glucose-Capillary: 139 mg/dL — ABNORMAL HIGH (ref 70–99)
Glucose-Capillary: 148 mg/dL — ABNORMAL HIGH (ref 70–99)
Glucose-Capillary: 171 mg/dL — ABNORMAL HIGH (ref 70–99)

## 2021-04-24 MED ORDER — BISACODYL 10 MG RE SUPP
10.0000 mg | Freq: Once | RECTAL | Status: AC
  Administered 2021-04-24: 09:00:00 10 mg via RECTAL
  Filled 2021-04-24: qty 1

## 2021-04-24 MED ORDER — SODIUM CHLORIDE 0.9 % IV SOLN
510.0000 mg | Freq: Once | INTRAVENOUS | Status: AC
  Administered 2021-04-24: 10:00:00 510 mg via INTRAVENOUS
  Filled 2021-04-24: qty 17

## 2021-04-24 MED ORDER — LOSARTAN POTASSIUM 50 MG PO TABS: 25.0000 mg | ORAL_TABLET | Freq: Every day | ORAL | Status: DC

## 2021-04-24 MED ORDER — CARVEDILOL 12.5 MG PO TABS
12.5000 mg | ORAL_TABLET | Freq: Two times a day (BID) | ORAL | 0 refills | Status: DC
Start: 2021-04-24 — End: 2021-06-13

## 2021-04-24 MED ORDER — INSULIN LISPRO 100 UNIT/ML IJ SOLN: 5.0000 [IU] | Freq: Three times a day (TID) | INTRAMUSCULAR | 0 refills | Status: DC

## 2021-04-24 MED ORDER — HEPARIN SOD (PORK) LOCK FLUSH 100 UNIT/ML IV SOLN
250.0000 [IU] | INTRAVENOUS | Status: AC | PRN
  Administered 2021-04-24: 15:00:00 250 [IU]
  Filled 2021-04-24: qty 2.5

## 2021-04-24 MED ORDER — OXYCODONE HCL 10 MG PO TABS: 10.0000 mg | ORAL_TABLET | Freq: Three times a day (TID) | ORAL | 0 refills | Status: AC | PRN

## 2021-04-24 MED ORDER — POLYETHYLENE GLYCOL 3350 17 G PO PACK: 17.0000 g | PACK | Freq: Every day | ORAL | 0 refills | Status: DC

## 2021-04-24 MED ORDER — CARVEDILOL 12.5 MG PO TABS
12.5000 mg | ORAL_TABLET | Freq: Two times a day (BID) | ORAL | Status: DC
  Administered 2021-04-24: 18:00:00 12.5 mg via ORAL
  Filled 2021-04-24: qty 1

## 2021-04-24 MED ORDER — LOSARTAN POTASSIUM 25 MG PO TABS: 25.0000 mg | ORAL_TABLET | Freq: Every day | ORAL | 0 refills | Status: DC

## 2021-04-24 MED ORDER — SENNOSIDES-DOCUSATE SODIUM 8.6-50 MG PO TABS
2.0000 | ORAL_TABLET | Freq: Two times a day (BID) | ORAL | Status: DC
  Administered 2021-04-24: 09:00:00 2 via ORAL
  Filled 2021-04-24: qty 2

## 2021-04-24 MED ORDER — INSULIN GLARGINE-YFGN 100 UNIT/ML ~~LOC~~ SOLN: 30.0000 [IU] | Freq: Every day | SUBCUTANEOUS | 0 refills | Status: DC

## 2021-04-24 MED ORDER — CEFAZOLIN IV (FOR PTA / DISCHARGE USE ONLY): 2.0000 g | Freq: Three times a day (TID) | INTRAVENOUS | 0 refills | Status: AC

## 2021-04-24 NOTE — Progress Notes (Signed)
     Subjective:  Patient is in good spirits today. R shoulder and R knee much better, minimal pain. Mobilizing well with PT. Denies distal n/t. Eager to go to rehab.  Objective:   VITALS:   Vitals:   04/23/21 1351 04/23/21 2100 04/23/21 2241 04/24/21 0520  BP: 127/76 131/68  (!) 101/51  Pulse: 64 64 78 62  Resp: 18 20 18 18   Temp: 98.5 F (36.9 C) 98.6 F (37 C)  98.1 F (36.7 C)  TempSrc: Oral Oral  Oral  SpO2: 95% 96% 96% 96%  Weight:      Height:        Sensation intact distally Intact pulses distally Dorsiflexion/Plantar flexion intact Incision: incision C/D/I, no erythema or drainage, sutures intact Compartment soft  RUE No traumatic wounds, ecchymosis, or rash  Minimally tender about R shoulder  Tolerates near full PROM of the shoulder with minimal discomfort No elbow or wrist effusion  Sens median, radial, ulnar intact  Motor AIN, PIN, IO intact  Radial pulse 2+, No significant edema    Lab Results  Component Value Date   WBC 7.8 04/24/2021   HGB 7.0 (L) 04/24/2021   HCT 23.4 (L) 04/24/2021   MCV 69.2 (L) 04/24/2021   PLT 344 04/24/2021   BMET    Component Value Date/Time   NA 136 04/24/2021 0229   K 4.1 04/24/2021 0229   CL 105 04/24/2021 0229   CO2 26 04/24/2021 0229   GLUCOSE 112 (H) 04/24/2021 0229   BUN 18 04/24/2021 0229   CREATININE 1.17 (H) 04/24/2021 0229   CALCIUM 8.3 (L) 04/24/2021 0229   GFRNONAA 49 (L) 04/24/2021 0229     Assessment/Plan: 10 Days Post-Op   Principal Problem:   Septic arthritis (HCC) Active Problems:   Acute systolic CHF (congestive heart failure) (HCC)   Acute R shoulder pain - much improved 11/26 Voltaren gel ordered R shoulder xrays ordered - negative  Post op recs: WB: WBAT Abx: Cxs positive for MSSA, ID following recommending 6 weeks of cefazolin Imaging: PACU xrays DVT prophylaxis: Resume Eliquis 2.5 twice daily starting postop day 1-2.  Then return to full anticoagulation dose 5 mg Eliquis  twice daily starting postop day 3 Dressing: Prevena VAC dressing removed 11/25. New aquacell dressing applied. Leave intact until follow up for suture removal 2-3 weeks postop.  Ace wrap removed 11/22, thigh-high TED hose stockings applied by myself on 11/23. Okay to remove TED stockings at night. Drain:  Removed 11/21 Follow up: 2 weeks after surgery for a wound check with Dr. Zachery Dakins at [facility].  Address: [address] [place], [place], [place] [address]  Office Phone: [phone]    Aulton Routt [place] 04/24/2021, 7:09 AM   Charlies Constable, MD Cell [phone]  Contact information:   [email or web address] 7am-5pm epic message Dr. Zachery Dakins, or call office for patient follow up: [phone] After hours and holidays please check [email or web address] for group call information for [facility]

## 2021-04-24 NOTE — Discharge Summary (Addendum)
Discharge Summary  Briggitte Boline UXL:[id number] DOB: 1945-10-28  PCP: Seward Carol, MD  Admit date: 04/13/2021 Discharge date: 04/24/2021  Time spent: 35 minutes.  Recommendations for Outpatient Follow-up:  Follow-up with infectious disease Follow-up with orthopedic surgery Follow-up with your cardiologist Follow-up with your primary care provider Take your medications as prescribed Continue PT OT with assistance and fall precautions.  Discharge Diagnoses:  Active Hospital Problems   Diagnosis Date Noted   Septic arthritis ([place]) 53/66/4403   Acute systolic CHF (congestive heart failure) ([place]) 02/03/2017    Resolved Hospital Problems  No resolved problems to display.    Discharge Condition: Stable.  Diet recommendation: Resume previous diet, heart healthy carb modified diet.  Vitals:   04/24/21 0520 04/24/21 0805  BP: (!) 101/51 (!) 104/31  Pulse: 62 71  Resp: 18 14  Temp: 98.1 F (36.7 C) 98.5 F (36.9 C)  SpO2: 96% 96%    History of present illness:  Maryclare Nydam is a 75 y.o. female, Jehovah witness, with PMH significant for obesity, OSA on CPAP, DM2, HTN, chronic combined systolic and diastolic CHF, CKD stage III, gout, bilateral knee replacement 3 years ago, breast cancer status post chemo lumpectomy, hypothyroidism, iron deficiency anemia, who presented to the ED from home via EMS on 04/13/21 with complaints of right knee pain x 1 week, progressively worsening leading up to an episode of excruciating pain the night prior to presentation.  In the ED, she was febrile with T-max 100.5, and had leukocytosis.  Right knee arthrocentesis culture collected on 04/14/2021 grew pansensitive staph aureus.  She initially received IV Rocephin 2 g daily, was later switched to IV Ancef for staph aureus septic arthritis.  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  Zachery Dakins.   Had also reported right shoulder pain which improved.  Shoulder x-ray was negative.  04/24/2021: Patient was seen and examined at her bedside.  Reported constipation, was given laxative and had 2 bowel movements.  She had no other complaints and is eager to go to rehab.   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 -04/14/21, patient underwent irrigation and debridement and polyexchange of infected right knee arthroplasty. Noted no plan for prosthesis explantation at this time.  Body fluid obtained on 04/14/2021 positive for Staphylococcus aureus, initially on Rocephin, she was switched to Ancef 04/18/2021 for pansensitive staph aureus septic arthritis -Per ID recommendation from 04/17/21, ceftriaxone was switched to Ancef for 6 weeks (EOT 05/25/21).  PICC line placed on 11/23. Patient will need suppressive antibiotics following IV therapy.   -Patient to follow-up with ID at the end of the treatment. -11/25, orthopedics removed Prevena VAC dressing.  New Aquacel dressing was applied.  Recommendation is to leave it intact until follow-up for suture removal in 2 to 3 weeks postop.     Acute on chronic combined systolic and diastolic CHF  Essential hypertension -Last echo from December 2021 with EF 45 to 47%, grade 2 diastolic dysfunction, mildly elevated pulmonary artery systolic pressure. -Home meds include Coreg 25 mg twice daily, Imdur 30 mg daily, losartan 50 mg daily, torsemide 40 mg daily, Aldactone 25 mg daily Aldactone held due to soft BPs.   Home losartan dose reduced to  25 mg daily Home Coreg dose reduced to 12.5 mg twice daily. -On admission, patient had bilateral pedal edema.  1 dose of Lasix was given.  Later held because of  AKI. -Torsemide restarted on discharge Follow-up with your cardiologist and PCP.  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 She is back to her baseline creatinine 1.17 with GFR of 49 on 04/24/2021. Creatinine peaked at 1.86 on 11/19.  Continue to avoid nephrotoxic agents and hypotension.   Resolved, posttreatment, hyperkalemia Serum potassium 4.1 on 04/24/2021.   Anemia of chronic disease/chronic microcytic anemia/iron deficiency anemia Hemoglobin 8.5> 7.0. Patient is Jehovah witness, declined blood transfusion. Iron deficiency on iron studies IV Feraheme administered on 04/24/2021.   Paroxysmal A. fib -Sinus rhythm now, continue Coreg for rate control -Continue home Eliquis.  Renally dosed per pharmacy Follow-up with your cardiologist.   Type 2 diabetes mellitus with diabetic polyneuropathy -A1c 7.5 in 04/13/21 Avoid hypoglycemia Home Semglee dose reduced to 30 units daily Jardiance 10 mg daily,  NovoLog 5 units 3 times daily Continue Neurontin.  Gout Continue allopurinol   Hypothyroidism Continue Synthroid   OSA Continue CPAP nightly  Goals of care:   Code Status: Full Code  Nutritional status: Body mass index is 38.28 kg/m. Diet:  Diet Order                  Diet regular Room service appropriate? Yes; Fluid consistency: Thin  Diet effective now                       DVT prophylaxis: Place TED hose Start: 04/18/21 0806 apixaban (ELIQUIS) tablet 5 mg  Antimicrobials: IV Ancef Fluid: Off IV fluid        Infusions:    ceFAZolin (ANCEF) IV 2 g (04/23/21 0537)        Procedures: I&D  Consultations: Orthopedic surgery  Discharge Exam: BP (!) 104/31 (BP Location: Left Leg)   Pulse 71   Temp 98.5 F (36.9 C) (Oral)   Resp 14   Ht 5' 4"  (1.626 m)   Wt 101.2 kg   SpO2 96%   BMI 38.28 kg/m  General: 75 y.o. year-old female well developed well nourished in no acute distress.  Alert and oriented  x3. Cardiovascular: Regular rate and rhythm with no rubs or gallops.  No thyromegaly or JVD noted.   Respiratory: Clear to auscultation with no wheezes or rales. Good inspiratory effort. Abdomen: Soft nontender nondistended with normal bowel sounds x4 quadrants. Musculoskeletal: Trace lower extremity edema bilaterally. Skin: No ulcerative lesions noted or rashes, Psychiatry: Mood is appropriate for condition and setting  Discharge Instructions You were cared for by a hospitalist during your hospital stay. If you have any questions about your discharge medications or the care you received while you were in the hospital after you are discharged, you can call the unit and asked to speak with the hospitalist on call if the hospitalist that took care of you is not available. Once you are discharged, your primary care physician will handle any further medical issues. Please note that NO REFILLS for any discharge medications will be authorized once you are discharged, as it is imperative that you return to your primary care physician (or establish a relationship with a primary care physician if you do not have one) for your aftercare needs so that they can reassess your need for medications and monitor your lab values.  Discharge Instructions     [facility] pharmacist to adjust dose for Vancomycin, Aminoglycosides and other anti-infective therapies as requested by physician.   Complete by: As directed    [facility] to provide Cath Flo 75m   Complete by: As directed    Administer for PICC line occlusion and as ordered by physician for other access device issues.   Anaphylaxis Kit: Provided to treat any anaphylactic reaction to the medication being provided to the patient if First Dose or when requested by physician   Complete by: As directed    Epinephrine 130mml vial / amp: Administer 0.13m24m0.13ml813mubcutaneously once for moderate to severe anaphylaxis, nurse to call physician  and pharmacy when reaction occurs and call 911 if needed for immediate care   Diphenhydramine 50mg30mIV vial: Administer 25-50mg 54mM PRN for first dose reaction, rash, itching, mild reaction, nurse to call physician and pharmacy when reaction occurs   Sodium Chloride 0.9% NS 500ml I13mdminister if needed for hypovolemic blood pressure drop or as ordered by physician after call to physician with anaphylactic reaction   Change dressing on IV access line weekly and PRN   Complete by: As directed    Flush IV access with Sodium Chloride 0.9% and Heparin 10 units/ml or 100 units/ml   Complete by: As directed    Home infusion instructions - [facility]   Complete by: As directed    Instructions: Flush IV access with Sodium Chloride 0.9% and Heparin 10units/ml or 100units/ml   Change dressing on IV access line: Weekly and PRN   Instructions Cath Flo 2mg: Ad84mister for PICC Line occlusion and as ordered by physician for other access device   [facility] pharmacist to adjust dose for: Vancomycin, Aminoglycosides and other anti-infective therapies as requested by physician   Method of administration may be changed at the discretion of home infusion pharmacist based upon assessment of the patient and/or caregiver's ability to self-administer the medication ordered   Complete by: As directed       Allergies as of 04/24/2021       Reactions   Other Other (See Comments)   Blood Product Refusal (Jehovah's witness)   Sulfa Antibiotics Rash   Uloric [febuxostat] Rash        Medication List     STOP taking these medications    FREESTYLE LITE test strip Generic drug: glucose blood   Insulin Degludec FlexTouch 100 UNIT/ML Sopn   nitroGLYCERIN 0.4 MG SL tablet Commonly known as: NITROSTAT   onetouch ultrasoft lancets   OneTouch Verio test strip Generic drug: glucose blood   spironolactone 25 MG tablet Commonly known as: ALDACTONE       TAKE these medications     acetaminophen 500 MG tablet Commonly known as: TYLENOL Take 500-1,000 mg by mouth every 6 (six) hours as needed (for pain or headaches).   Alcohol Swabs Pads See admin instructions.   ALIVE ONCE DAILY WOMENS 50+ PO Take 1 tablet by mouth daily.   allopurinol 100 MG tablet Commonly known as: ZYLOPRIM Take 100 mg by mouth daily.   atorvastatin 80 MG tablet Commonly known as: LIPITOR Take 1 tablet (80 mg total) by mouth at bedtime.   Biotin 10 MG Tabs Take 10 mg by mouth daily.   carvedilol 12.5 MG tablet Commonly known as: COREG Take 1 tablet (12.5 mg total) by mouth 2 (two) times daily with a meal. What changed:  medication strength how much to take how to take this when to take this additional instructions  ceFAZolin  IVPB Commonly known as: ANCEF Inject 2 g into the vein every 8 (eight) hours. Indication: right knee prosthetic joint infection First Dose: Yes Last Day of Therapy:  05/26/2021 Labs - Once weekly:  CBC/D and BMP, Labs - Every other week:  ESR and CRP Method of administration: IV Push Method of administration may be changed at the discretion of home infusion pharmacist based upon assessment of the patient and/or caregiver's ability to self-administer the medication ordered.   clotrimazole-betamethasone lotion Commonly known as: LOTRISONE Apply 1 application topically 2 (two) times daily as needed (itching).   Dexcom G6 Sensor Misc 1 Device by Does not apply route as directed.   Dexcom G6 Transmitter Misc 1 Device by Does not apply route as directed.   Eliquis 5 MG Tabs tablet Generic drug: apixaban Take 1 tablet (5 mg total) by mouth 2 (two) times daily.   empagliflozin 10 MG Tabs tablet Commonly known as: JARDIANCE Take 10 mg by mouth daily.   gabapentin 300 MG capsule Commonly known as: NEURONTIN Take 300 mg by mouth 2 (two) times daily.   insulin glargine-yfgn 100 UNIT/ML injection Commonly known as: SEMGLEE Inject 0.3 mLs (30  Units total) into the skin daily. What changed: how much to take   insulin lispro 100 UNIT/ML injection Commonly known as: HUMALOG Inject 0.05 mLs (5 Units total) into the skin 3 (three) times daily before meals. What changed: how much to take   Iron 325 (65 Fe) MG Tabs Take 1 tablet (325 mg total) by mouth daily.   isosorbide mononitrate 30 MG 24 hr tablet Commonly known as: IMDUR TAKE 1 TABLET BY MOUTH ONCE DAILY What changed:  how much to take when to take this additional instructions   latanoprost 0.005 % ophthalmic solution Commonly known as: XALATAN Place 1 drop into both eyes at bedtime.   levothyroxine 75 MCG tablet Commonly known as: SYNTHROID Take 75 mcg by mouth daily before breakfast.   losartan 25 MG tablet Commonly known as: COZAAR Take 1 tablet (25 mg total) by mouth daily. Start taking on: April 25, 2021 What changed:  medication strength how much to take how to take this when to take this additional instructions   Lumigan 0.01 % Soln Generic drug: bimatoprost Place 1 drop into both eyes at bedtime.   mupirocin ointment 2 % Commonly known as: BACTROBAN Apply to left great toe once daily for 14 days. What changed:  how much to take how to take this when to take this additional instructions   OneTouch Verio w/Device Kit Use to check blood sugar   polyethylene glycol 17 g packet Commonly known as: MIRALAX / GLYCOLAX Take 17 g by mouth daily. Start taking on: April 25, 2021   potassium chloride 10 MEQ tablet Commonly known as: KLOR-CON Take 2 tablets by mouth once daily   RELION PEN NEEDLE 31G/8MM 31G X 8 MM Misc Generic drug: Insulin Pen Needle Inject 31 g as directed in the morning, at noon, and at bedtime.   torsemide 20 MG tablet Commonly known as: DEMADEX Take 2 tablets (40 mg total) by mouth daily.   Travoprost (BAK Free) 0.004 % Soln ophthalmic solution Commonly known as: TRAVATAN Place 1 drop into both eyes at  bedtime.               Discharge Care Instructions  (From admission, onward)           Start     Ordered   04/24/21 0000  Change dressing on IV access line weekly and PRN  (Home infusion instructions - [facility] )        04/24/21 1209           Allergies  Allergen Reactions   Other Other (See Comments)    Blood Product Refusal (Jehovah's witness)     Sulfa Antibiotics Rash   Uloric [Febuxostat] Rash    Follow-up Information     Seward Carol, MD Follow up.   Specialty: Internal Medicine Contact information: [address] [place] [address] [phone]         Constance Haw, MD .   Specialty: Cardiology Contact information: [place] [address] [phone]         Sueanne Margarita, MD .   Specialty: Cardiology Contact information: [phone] [place] [address] [phone]         Willaim Sheng, MD. Call in 1 day(s).   Specialty: Orthopedic Surgery Why: Please call for a posthospital follow-up appointment. Contact information: [address] [place] [address] [phone]         Laurice Record, MD. Call today.   Specialty: Internal Medicine Why: Please call for a posthospital follow-up appointment with an infectious disease. Contact information: [address], [place] [address] [phone]         Tommy Medal, Lavell Islam, MD. Call today.   Specialty: Infectious Diseases Why: Please call for a post hospital follow-up appointment for septic arthritis. Contact information: [address] [place] [address] [phone]                  The results of significant diagnostics from this hospitalization (including imaging, microbiology, ancillary and laboratory) are listed below for reference.    Significant Diagnostic Studies: DG Chest 1 View  Result Date: 04/13/2021 CLINICAL  DATA:  Acute systolic CHF EXAM: CHEST  1 VIEW COMPARISON:  06/20/2018 FINDINGS: Shallow inspiration with low lung volumes. Pulmonary vascular congestion. Patchy density primarily at the lung bases. Similar cardiomediastinal contours with cardiomegaly. Left chest wall dual lead pacemaker. IMPRESSION: Pulmonary vascular congestion with patchy primarily basilar density that may reflect edema. Electronically Signed   By: Macy Mis M.D.   On: 04/13/2021 15:59   DG Shoulder Right  Result Date: 04/21/2021 CLINICAL DATA:  Right shoulder pain for months with no specific injury. EXAM: RIGHT SHOULDER - 2+ VIEW COMPARISON:  Right shoulder CT, 03/01/2021 FINDINGS: No fracture or bone lesion. Glenohumeral joint is normally spaced and aligned. Mild AC joint osteoarthritis.  AC joint normally aligned. Right-sided PICC extends across the medial right upper arm and right axilla. Soft tissues otherwise unremarkable. IMPRESSION: 1. No fracture or bone lesion. 2. Mild AC joint osteoarthritis. Stable appearance from the recent prior shoulder CT. Electronically Signed   By: Lajean Manes M.D.   On: 04/21/2021 08:44   DG Knee Complete 4 Views Right  Result Date: 04/13/2021 CLINICAL DATA:  Right knee pain. EXAM: RIGHT KNEE - COMPLETE 4+ VIEW COMPARISON:  None. FINDINGS: There is no acute fracture or dislocation. The bones are osteopenic. There is a total right knee arthroplasty. The arthroplasty components appear intact and in anatomic alignment. No joint effusion. Vascular calcifications. Diffuse subcutaneous edema. IMPRESSION: 1. No acute fracture or dislocation. 2. Total right knee arthroplasty appears intact. 3. Osteopenia. Electronically Signed   By: Anner Crete M.D.   On: 04/13/2021 03:36  DG Knee Right Port  Result Date: 04/14/2021 CLINICAL DATA:  Status post right knee debridement EXAM: PORTABLE RIGHT KNEE - 1-2 VIEW COMPARISON:  04/13/2021 FINDINGS: Right knee prosthesis is again seen. Surgical drain is  noted in place. Vascular calcifications are noted. No acute bony or soft tissue abnormality is seen. IMPRESSION: Postsurgical changes without acute abnormality. Electronically Signed   By: Inez Catalina M.D.   On: 04/14/2021 19:10   VAS [place] LOWER EXTREMITY VENOUS (DVT) (7a-7p)  Result Date: 04/13/2021  Lower Venous DVT Study Patient Name:  JENIAH KISHI  Date of Exam:   04/13/2021 Medical Rec #: [id number]      Accession #:    [id number] Date of Birth: Sep 05, 1945      Patient Gender: F Patient Age:   37 years Exam Location:  [facility] Procedure:      VAS [place] LOWER EXTREMITY VENOUS (DVT) Referring Phys: [name] --------------------------------------------------------------------------------  Indications: Pain, and Swelling.  Comparison Study: 11/15/17 prior Performing Technologist: Archie Patten RVS  Examination Guidelines: A complete evaluation includes B-mode imaging, spectral Doppler, color Doppler, and power Doppler as needed of all accessible portions of each vessel. Bilateral testing is considered an integral part of a complete examination. Limited examinations for reoccurring indications may be performed as noted. The reflux portion of the exam is performed with the patient in reverse Trendelenburg.  +---------+---------------+---------+-----------+----------+-------------------+ RIGHT    CompressibilityPhasicitySpontaneityPropertiesThrombus Aging      +---------+---------------+---------+-----------+----------+-------------------+ CFV      Full           Yes      Yes                                      +---------+---------------+---------+-----------+----------+-------------------+ SFJ      Full                                                             +---------+---------------+---------+-----------+----------+-------------------+ FV Prox  Full                                                              +---------+---------------+---------+-----------+----------+-------------------+ FV Mid   Full                                                             +---------+---------------+---------+-----------+----------+-------------------+ FV DistalFull                                                             +---------+---------------+---------+-----------+----------+-------------------+ PFV      Full                                                             +---------+---------------+---------+-----------+----------+-------------------+  POP      Full           Yes      Yes                                      +---------+---------------+---------+-----------+----------+-------------------+ PTV      Full                                                             +---------+---------------+---------+-----------+----------+-------------------+ PERO                                                  Not well visualized +---------+---------------+---------+-----------+----------+-------------------+   +----+---------------+---------+-----------+----------+--------------+ LEFTCompressibilityPhasicitySpontaneityPropertiesThrombus Aging +----+---------------+---------+-----------+----------+--------------+ CFV Full           Yes      Yes                                 +----+---------------+---------+-----------+----------+--------------+     Summary: RIGHT: - There is no evidence of deep vein thrombosis in the lower extremity.  - No cystic structure found in the popliteal fossa.  LEFT: - No evidence of common femoral vein obstruction.  *See table(s) above for measurements and observations. Electronically signed by Orlie Pollen on 04/13/2021 at 7:05:12 PM.    Final    [place] EKG SITE RITE  Result Date: 04/18/2021 If Site Rite image not attached, placement could not be confirmed due to current cardiac rhythm.   Microbiology: Recent Results (from the past 240  hour(s))  Aerobic/Anaerobic Culture w Gram Stain (surgical/deep wound)     Status: None   Collection Time: 04/14/21  4:41 PM   Specimen: Soft Tissue, Other  Result Value Ref Range Status   Specimen Description TISSUE KNEE  Final   Special Requests SEPTIC KNEE SAMPLE A  Final   Gram Stain   Final    NO SQUAMOUS EPITHELIAL CELLS SEEN MODERATE WBC SEEN NO ORGANISMS SEEN    Culture   Final    RARE STAPHYLOCOCCUS AUREUS NO ANAEROBES ISOLATED CRITICAL VALUE NOTED.  VALUE IS CONSISTENT WITH PREVIOUSLY REPORTED AND CALLED VALUE. Performed at [facility], [place] [address] [place], [place] [address]    Report Status 04/20/2021 FINAL  Final   Organism ID, Bacteria STAPHYLOCOCCUS AUREUS  Final      Susceptibility   Staphylococcus aureus - MIC*    CIPROFLOXACIN <=0.5 SENSITIVE Sensitive     ERYTHROMYCIN <=0.25 SENSITIVE Sensitive     GENTAMICIN <=0.5 SENSITIVE Sensitive     OXACILLIN <=0.25 SENSITIVE Sensitive     TETRACYCLINE <=1 SENSITIVE Sensitive     VANCOMYCIN 1 SENSITIVE Sensitive     TRIMETH/SULFA <=10 SENSITIVE Sensitive     CLINDAMYCIN <=0.25 SENSITIVE Sensitive     RIFAMPIN <=0.5 SENSITIVE Sensitive     Inducible Clindamycin NEGATIVE Sensitive     * RARE STAPHYLOCOCCUS AUREUS  Aerobic/Anaerobic Culture w Gram Stain (surgical/deep wound)     Status: None   Collection Time: 04/14/21  4:44 PM   Specimen: Soft Tissue,  Other  Result Value Ref Range Status   Specimen Description TISSUE KNEE  Final   Special Requests SEPTIC KNEE SAMPLE B  Final   Gram Stain   Final    NO SQUAMOUS EPITHELIAL CELLS SEEN NO WBC SEEN NO ORGANISMS SEEN    Culture   Final    RARE STAPHYLOCOCCUS AUREUS NO ANAEROBES ISOLATED CRITICAL VALUE NOTED.  VALUE IS CONSISTENT WITH PREVIOUSLY REPORTED AND CALLED VALUE. Performed at [facility], [place] [address], [place], [place] [address]    Report Status 04/20/2021 FINAL  Final   Organism ID, Bacteria STAPHYLOCOCCUS AUREUS  Final       Susceptibility   Staphylococcus aureus - MIC*    CIPROFLOXACIN <=0.5 SENSITIVE Sensitive     ERYTHROMYCIN <=0.25 SENSITIVE Sensitive     GENTAMICIN <=0.5 SENSITIVE Sensitive     OXACILLIN 0.5 SENSITIVE Sensitive     TETRACYCLINE <=1 SENSITIVE Sensitive     VANCOMYCIN 1 SENSITIVE Sensitive     TRIMETH/SULFA <=10 SENSITIVE Sensitive     CLINDAMYCIN <=0.25 SENSITIVE Sensitive     RIFAMPIN <=0.5 SENSITIVE Sensitive     Inducible Clindamycin NEGATIVE Sensitive     * RARE STAPHYLOCOCCUS AUREUS  Aerobic/Anaerobic Culture w Gram Stain (surgical/deep wound)     Status: None   Collection Time: 04/14/21  4:45 PM   Specimen: Soft Tissue, Other  Result Value Ref Range Status   Specimen Description TISSUE KNEE  Final   Special Requests SEPTIC KNEE SAMPLE C  Final   Gram Stain   Final    NO SQUAMOUS EPITHELIAL CELLS SEEN FEW WBC SEEN NO ORGANISMS SEEN    Culture   Final    RARE STAPHYLOCOCCUS AUREUS NO ANAEROBES ISOLATED CRITICAL RESULT CALLED TO, READ BACK BY AND VERIFIED WITH: VIA EPIC DR Marlowe Aschoff [phone] FCP Performed at [facility], [place] [address], [place], [place] [address]    Report Status 04/20/2021 FINAL  Final   Organism ID, Bacteria STAPHYLOCOCCUS AUREUS  Final      Susceptibility   Staphylococcus aureus - MIC*    CIPROFLOXACIN <=0.5 SENSITIVE Sensitive     ERYTHROMYCIN <=0.25 SENSITIVE Sensitive     GENTAMICIN <=0.5 SENSITIVE Sensitive     OXACILLIN 0.5 SENSITIVE Sensitive     TETRACYCLINE <=1 SENSITIVE Sensitive     VANCOMYCIN 1 SENSITIVE Sensitive     TRIMETH/SULFA <=10 SENSITIVE Sensitive     CLINDAMYCIN <=0.25 SENSITIVE Sensitive     RIFAMPIN <=0.5 SENSITIVE Sensitive     Inducible Clindamycin NEGATIVE Sensitive     * RARE STAPHYLOCOCCUS AUREUS  Resp Panel by RT-PCR (Flu A&B, Covid) Nasopharyngeal Swab     Status: None   Collection Time: 04/24/21 11:38 AM   Specimen: Nasopharyngeal Swab; Nasopharyngeal(NP) swabs in vial transport medium  Result Value Ref Range  Status   SARS Coronavirus 2 by RT PCR NEGATIVE NEGATIVE Final    Comment: (NOTE) SARS-CoV-2 target nucleic acids are NOT DETECTED.  The SARS-CoV-2 RNA is generally detectable in upper respiratory specimens during the acute phase of infection. The lowest concentration of SARS-CoV-2 viral copies this assay can detect is 138 copies/mL. A negative result does not preclude SARS-Cov-2 infection and should not be used as the sole basis for treatment or other patient management decisions. A negative result may occur with  improper specimen collection/handling, submission of specimen other than nasopharyngeal swab, presence of viral mutation(s) within the areas targeted by this assay, and inadequate number of viral copies(<138 copies/mL). A negative result must  be combined with clinical observations, patient history, and epidemiological information. The expected result is Negative.  Fact Sheet for Patients:  EntrepreneurPulse.com.au  Fact Sheet for Healthcare Providers:  IncredibleEmployment.be  This test is no t yet approved or cleared by the [place] FDA and  has been authorized for detection and/or diagnosis of SARS-CoV-2 by FDA under an Emergency Use Authorization (EUA). This EUA will remain  in effect (meaning this test can be used) for the duration of the COVID-19 declaration under Section 564(b)(1) of the Act, 21 U.S.C.section 360bbb-3(b)(1), unless the authorization is terminated  or revoked sooner.       Influenza A by PCR NEGATIVE NEGATIVE Final   Influenza B by PCR NEGATIVE NEGATIVE Final    Comment: (NOTE) The Xpert Xpress SARS-CoV-2/FLU/RSV plus assay is intended as an aid in the diagnosis of influenza from Nasopharyngeal swab specimens and should not be used as a sole basis for treatment. Nasal washings and aspirates are unacceptable for Xpert Xpress SARS-CoV-2/FLU/RSV testing.  Fact Sheet for  Patients: EntrepreneurPulse.com.au  Fact Sheet for Healthcare Providers: IncredibleEmployment.be  This test is not yet approved or cleared by the [place] FDA and has been authorized for detection and/or diagnosis of SARS-CoV-2 by FDA under an Emergency Use Authorization (EUA). This EUA will remain in effect (meaning this test can be used) for the duration of the COVID-19 declaration under Section 564(b)(1) of the Act, 21 U.S.C. section 360bbb-3(b)(1), unless the authorization is terminated or revoked.  Performed at [facility], [place] [address], [place], [place] [address]      Labs: Basic Metabolic Panel: Recent Labs  Lab 04/18/21 0222 04/19/21 0205 04/21/21 0250 04/24/21 0229  NA 138 139 138 136  K 4.2 4.6 4.5 4.1  CL 106 108 107 105  CO2 27 22 23 26   GLUCOSE 156* 123* 127* 112*  BUN 33* 30* 24* 18  CREATININE 1.25* 1.27* 1.23* 1.17*  CALCIUM 8.3* 8.7* 8.5* 8.3*  MG  --   --   --  2.6*  PHOS  --   --   --  2.9   Liver Function Tests: No results for input(s): AST, ALT, ALKPHOS, BILITOT, PROT, ALBUMIN in the last 168 hours. No results for input(s): LIPASE, AMYLASE in the last 168 hours. No results for input(s): AMMONIA in the last 168 hours. CBC: Recent Labs  Lab 04/18/21 0222 04/19/21 0205 04/21/21 0250 04/24/21 0229  WBC 7.4 7.7 10.0 7.8  NEUTROABS 4.2 4.5 5.9  --   HGB 8.4* 8.6* 8.5* 7.0*  HCT 28.6* 28.3* 27.9* 23.4*  MCV 69.4* 69.2* 67.9* 69.2*  PLT 178 221 273 344   Cardiac Enzymes: No results for input(s): CKTOTAL, CKMB, CKMBINDEX, TROPONINI in the last 168 hours. BNP: BNP (last 3 results) No results for input(s): BNP in the last 8760 hours.  ProBNP (last 3 results) No results for input(s): PROBNP in the last 8760 hours.  CBG: Recent Labs  Lab 04/23/21 1140 04/23/21 1600 04/23/21 2102 04/24/21 0827 04/24/21 1152  GLUCAP 129* 109* 136* 139* 148*       Signed:  Kayleen Memos, MD [facility] 04/24/2021, 2:11 PM

## 2021-04-24 NOTE — Progress Notes (Signed)
Physical Therapy Treatment Patient Details Name: Danielle [name] MRN: [id number] DOB: 04-Jun-1945 Today's Date: 04/24/2021   History of Present Illness 75 y/o female presented to ED on 11/17 with cellulitis and concern for prosthetic joint infection. S/p fluid aspiration and I&D and polyexchange R TKA on 11/18. PMH: chronic combined systolic and diastolic CHF, CKD stage III, IDDM, HTN, bilateral knee replacement, breast cancer s/p chemo and lumpectomy, OSA on CPAP, hypothyroidism.    PT Comments    Pt is making steady progress toward goals.  Emphasis on transitions to EOB, scooting, sitting balance, sit to stands into the STEDY and pre-gait activity including w/shifts-unweighting and marching in place.    Recommendations for follow up therapy are one component of a multi-disciplinary discharge planning process, led by the attending physician.  Recommendations may be updated based on patient status, additional functional criteria and insurance authorization.  Follow Up Recommendations  Skilled nursing-short term rehab (<3 hours/day)     Assistance Recommended at Discharge Frequent or constant Supervision/Assistance  Equipment Recommendations  Rolling walker (2 wheels)    Recommendations for Other Services       Precautions / Restrictions Precautions Precautions: Fall Restrictions RLE Weight Bearing: Weight bearing as tolerated     Mobility  Bed Mobility Overal bed mobility: Needs Assistance Bed Mobility: Supine to Sit     Supine to sit: Min assist;HOB elevated     General bed mobility comments: pt moved LE's off the bed without assist, but needed truncal assist while using UE's to come up and scoot to EOB    Transfers Overall transfer level: Needs assistance Equipment used: Ambulation equipment used Transfers: Sit to/from Stand Sit to Stand: Mod assist           General transfer comment: cues for pushing up.  assist to come forward and boost. x 3 Transfer via Lift  Equipment: Stedy  Ambulation/Gait                   Stairs             Wheelchair Mobility    Modified Rankin (Stroke Patients Only)       Balance Overall balance assessment: Needs assistance   Sitting balance-Leahy Scale: Fair       Standing balance-Leahy Scale: Poor Standing balance comment: reliant on external support and use of STEDY.  Worked on pre gait activity x2 with w/shifts, unweighting and marching in place over 1-2 min each trial.                            Cognition Arousal/Alertness: Awake/alert Behavior During Therapy: WFL for tasks assessed/performed Overall Cognitive Status: Within Functional Limits for tasks assessed                                          Exercises      General Comments        Pertinent Vitals/Pain Pain Assessment: Faces Faces Pain Scale: Hurts little more Pain Location: R knee Pain Descriptors / Indicators: Guarding;Sore Pain Intervention(s): Monitored during session    Home Living                          Prior Function            PT Goals (current goals can now be found in  the care plan section) Acute Rehab PT Goals Patient Stated Goal: decrease pain PT Goal Formulation: With patient Time For Goal Achievement: 04/29/21 Potential to Achieve Goals: Fair Progress towards PT goals: Progressing toward goals    Frequency    Min 3X/week      PT Plan Current plan remains appropriate    Co-evaluation              AM-PAC PT "6 Clicks" Mobility   Outcome Measure  Help needed turning from your back to your side while in a flat bed without using bedrails?: A Little Help needed moving from lying on your back to sitting on the side of a flat bed without using bedrails?: A Lot Help needed moving to and from a bed to a chair (including a wheelchair)?: A Lot Help needed standing up from a chair using your arms (e.g., wheelchair or bedside chair)?: A Lot Help  needed to walk in hospital room?: Total Help needed climbing 3-5 steps with a railing? : Total 6 Click Score: 11    End of Session   Activity Tolerance: Patient limited by fatigue;Patient tolerated treatment well Patient left: in chair;with call bell/phone within reach Nurse Communication: Mobility status PT Visit Diagnosis: Other abnormalities of gait and mobility (R26.89);Muscle weakness (generalized) (M62.81)     Time: 7616-0737 PT Time Calculation (min) (ACUTE ONLY): 21 min  Charges:  $Therapeutic Activity: 8-22 mins                     04/24/2021  Danielle Carne., PT Acute Rehabilitation Services [phone]  (pager) [phone]  (office)   Danielle [name] 04/24/2021, 3:18 PM

## 2021-04-24 NOTE — TOC Transition Note (Signed)
Transition of Care [facility]) - CM/SW Discharge Note   Patient Details  Name: Danielle [name] MRN: [id number] Date of Birth: 02/01/1946  [facility] CM/SW Contact:  Joanne Chars, LCSW Phone Number: 04/24/2021, 2:25 PM   Clinical Narrative:   Pt discharging to [facility] SNF. RN call report to [phone].  PTAR called at [phone].      Final next level of care: Skilled Nursing Facility Barriers to Discharge: Barriers Resolved   Patient Goals and CMS Choice Patient states their goals for this hospitalization and ongoing recovery are:: To return home CMS Medicare.gov Compare Post Acute Care list provided to:: Patient Choice offered to / list presented to : Patient  Discharge Placement              Patient chooses bed at:  [name]) Patient to be transferred to facility by: [place] Name of family member notified: [name] Patient and family notified of of transfer: 04/24/21  Discharge Plan and Services                                     Social Determinants of Health (SDOH) Interventions     Readmission Risk Interventions No flowsheet data found.

## 2021-04-24 NOTE — Consult Note (Signed)
   [facility] Inpatient Consult   04/24/2021  Alisan Dokes Jul 22, 1945 [id number]  [facility] [ACO] Patient: Danielle [name] [facility]  Primary Care Provider:  Seward Carol, MD [facility] at [place], this is an Embedded provider with CCM program.   Reviewed for High risk and LLOS >10 days  Plan:  No [facility] follow up, patient followed in another Care Management program, sign off.    For questions contact:   Natividad Brood, RN BSN [facility] Liaison  [phone] business mobile phone Toll free office [phone]  Fax number: [phone] [email or web address]

## 2021-04-24 NOTE — Progress Notes (Signed)
Mobility Specialist Progress Note   04/24/21 1400  Mobility  Activity Transferred to/from [facility] (Transferred to Bathroom*)  Level of Assistance Contact guard assist, steadying assist  Assistive Device Stedy  RLE Weight Bearing WBAT  Mobility Sit up in bed/chair position for meals  Mobility Response Tolerated well  Mobility performed by Mobility specialist  $Mobility charge 1 Mobility   Received pt in BR requesting to get back to chair. Transferred w/o complaint. Left w/ call bell in reach and RN in room.   Holland Falling Mobility Specialist Phone Number [phone]'

## 2021-04-24 NOTE — Care Management Important Message (Signed)
Important Message  Patient Details  Name: Danielle [name] MRN: [id number] Date of Birth: Aug 16, 1945   Medicare Important Message Given:  Yes     Joetta Manners 04/24/2021, 2:01 PM

## 2021-04-26 DIAGNOSIS — N179 Acute kidney failure, unspecified: Secondary | ICD-10-CM | POA: Diagnosis not present

## 2021-04-26 DIAGNOSIS — D631 Anemia in chronic kidney disease: Secondary | ICD-10-CM | POA: Diagnosis not present

## 2021-04-26 DIAGNOSIS — M009 Pyogenic arthritis, unspecified: Secondary | ICD-10-CM | POA: Diagnosis not present

## 2021-04-26 DIAGNOSIS — M109 Gout, unspecified: Secondary | ICD-10-CM | POA: Diagnosis not present

## 2021-04-26 DIAGNOSIS — G4733 Obstructive sleep apnea (adult) (pediatric): Secondary | ICD-10-CM | POA: Diagnosis not present

## 2021-04-26 DIAGNOSIS — E611 Iron deficiency: Secondary | ICD-10-CM | POA: Diagnosis not present

## 2021-04-26 DIAGNOSIS — I5040 Unspecified combined systolic (congestive) and diastolic (congestive) heart failure: Secondary | ICD-10-CM | POA: Diagnosis not present

## 2021-04-26 DIAGNOSIS — Z9889 Other specified postprocedural states: Secondary | ICD-10-CM | POA: Diagnosis not present

## 2021-04-26 DIAGNOSIS — N183 Chronic kidney disease, stage 3 unspecified: Secondary | ICD-10-CM | POA: Diagnosis not present

## 2021-04-26 DIAGNOSIS — N189 Chronic kidney disease, unspecified: Secondary | ICD-10-CM | POA: Diagnosis not present

## 2021-05-02 ENCOUNTER — Telehealth (HOSPITAL_COMMUNITY): Payer: Self-pay

## 2021-05-02 DIAGNOSIS — M25561 Pain in right knee: Secondary | ICD-10-CM | POA: Diagnosis not present

## 2021-05-02 NOTE — Telephone Encounter (Signed)
Danielle [name]'s daughter called and requested I speak to [facility] program to assist with verifying medications for her and her mother choosing an [facility], retail. I verified medications with Danielle [name]'s permission.   She is still in SNF at [facility]. I will continue to assist as needed.

## 2021-05-03 ENCOUNTER — Telehealth (HOSPITAL_COMMUNITY): Payer: Self-pay

## 2021-05-03 DIAGNOSIS — E118 Type 2 diabetes mellitus with unspecified complications: Secondary | ICD-10-CM | POA: Diagnosis not present

## 2021-05-03 DIAGNOSIS — I5040 Unspecified combined systolic (congestive) and diastolic (congestive) heart failure: Secondary | ICD-10-CM | POA: Diagnosis not present

## 2021-05-03 DIAGNOSIS — I890 Lymphedema, not elsewhere classified: Secondary | ICD-10-CM | POA: Diagnosis not present

## 2021-05-03 DIAGNOSIS — M009 Pyogenic arthritis, unspecified: Secondary | ICD-10-CM | POA: Diagnosis not present

## 2021-05-03 NOTE — Telephone Encounter (Signed)
Spoke to Danielle [name] who was informed of discharge from paramedicine while she is in SNF, she was encouraged once she is back home to reach out to AHF clinic to schedule an appointment. She understood same and agreed with d/c plan. Call complete.   Patient is now discharged from [facility].  Patient has/has not met the following goals:  Yes :Patient expresses basic understanding of medications and what they are for Yes :Patient able to verbalize heart failure specific dietary/fluid restrictions Yes :Patient is aware of who to call if they have medical concerns or if they need to schedule or change appts Yes :Patient has a scale for daily weights and weighs regularly Yes :Patient able to verbalize concerning symptoms when they should call the HF clinic (weight gain ranges, etc) Yes :Patient has a PCP and has seen within the past year or has upcoming appt Yes :Patient has reliable access to getting their medications Yes :Patient has shown they are able to reorder medications reliably Yes :Patient has had admission in past 30 days- if yes how many? Yes :Patient has had admission in past 90 days- if yes how many?  Discharge Comments: Admissions not HF related.

## 2021-05-05 DIAGNOSIS — Z9889 Other specified postprocedural states: Secondary | ICD-10-CM | POA: Diagnosis not present

## 2021-05-05 DIAGNOSIS — M009 Pyogenic arthritis, unspecified: Secondary | ICD-10-CM | POA: Diagnosis not present

## 2021-05-05 DIAGNOSIS — I5040 Unspecified combined systolic (congestive) and diastolic (congestive) heart failure: Secondary | ICD-10-CM | POA: Diagnosis not present

## 2021-05-08 ENCOUNTER — Encounter (INDEPENDENT_AMBULATORY_CARE_PROVIDER_SITE_OTHER): Payer: Medicare HMO | Admitting: Ophthalmology

## 2021-05-10 DIAGNOSIS — M009 Pyogenic arthritis, unspecified: Secondary | ICD-10-CM | POA: Diagnosis not present

## 2021-05-10 DIAGNOSIS — Z0189 Encounter for other specified special examinations: Secondary | ICD-10-CM | POA: Diagnosis not present

## 2021-05-10 DIAGNOSIS — E118 Type 2 diabetes mellitus with unspecified complications: Secondary | ICD-10-CM | POA: Diagnosis not present

## 2021-05-10 DIAGNOSIS — I890 Lymphedema, not elsewhere classified: Secondary | ICD-10-CM | POA: Diagnosis not present

## 2021-05-15 DIAGNOSIS — T454X5A Adverse effect of iron and its compounds, initial encounter: Secondary | ICD-10-CM | POA: Diagnosis not present

## 2021-05-15 DIAGNOSIS — J310 Chronic rhinitis: Secondary | ICD-10-CM | POA: Diagnosis not present

## 2021-05-15 DIAGNOSIS — G4709 Other insomnia: Secondary | ICD-10-CM | POA: Diagnosis not present

## 2021-05-15 DIAGNOSIS — Z713 Dietary counseling and surveillance: Secondary | ICD-10-CM | POA: Diagnosis not present

## 2021-05-15 DIAGNOSIS — K5903 Drug induced constipation: Secondary | ICD-10-CM | POA: Diagnosis not present

## 2021-05-15 DIAGNOSIS — E611 Iron deficiency: Secondary | ICD-10-CM | POA: Diagnosis not present

## 2021-05-19 NOTE — Telephone Encounter (Signed)
Advanced Heart Failure Patient Advocate Encounter   Patient was approved to receive Jardiance from [place]  Effective dates: 05/28/21 through 05/27/22  [name], CPhT

## 2021-05-22 DIAGNOSIS — R2689 Other abnormalities of gait and mobility: Secondary | ICD-10-CM | POA: Diagnosis not present

## 2021-05-22 DIAGNOSIS — M00061 Staphylococcal arthritis, right knee: Secondary | ICD-10-CM | POA: Diagnosis not present

## 2021-05-22 DIAGNOSIS — M6281 Muscle weakness (generalized): Secondary | ICD-10-CM | POA: Diagnosis not present

## 2021-05-22 DIAGNOSIS — R278 Other lack of coordination: Secondary | ICD-10-CM | POA: Diagnosis not present

## 2021-05-22 DIAGNOSIS — R262 Difficulty in walking, not elsewhere classified: Secondary | ICD-10-CM | POA: Diagnosis not present

## 2021-05-23 DIAGNOSIS — R278 Other lack of coordination: Secondary | ICD-10-CM | POA: Diagnosis not present

## 2021-05-23 DIAGNOSIS — R2689 Other abnormalities of gait and mobility: Secondary | ICD-10-CM | POA: Diagnosis not present

## 2021-05-23 DIAGNOSIS — R262 Difficulty in walking, not elsewhere classified: Secondary | ICD-10-CM | POA: Diagnosis not present

## 2021-05-23 DIAGNOSIS — M6281 Muscle weakness (generalized): Secondary | ICD-10-CM | POA: Diagnosis not present

## 2021-05-23 DIAGNOSIS — M00061 Staphylococcal arthritis, right knee: Secondary | ICD-10-CM | POA: Diagnosis not present

## 2021-05-24 ENCOUNTER — Ambulatory Visit (INDEPENDENT_AMBULATORY_CARE_PROVIDER_SITE_OTHER): Payer: Medicare HMO | Admitting: Internal Medicine

## 2021-05-24 ENCOUNTER — Other Ambulatory Visit: Payer: Self-pay

## 2021-05-24 VITALS — HR 73 | Temp 98.0°F

## 2021-05-24 DIAGNOSIS — T8450XS Infection and inflammatory reaction due to unspecified internal joint prosthesis, sequela: Secondary | ICD-10-CM

## 2021-05-24 DIAGNOSIS — M6281 Muscle weakness (generalized): Secondary | ICD-10-CM | POA: Diagnosis not present

## 2021-05-24 DIAGNOSIS — M00061 Staphylococcal arthritis, right knee: Secondary | ICD-10-CM | POA: Diagnosis not present

## 2021-05-24 DIAGNOSIS — A4901 Methicillin susceptible Staphylococcus aureus infection, unspecified site: Secondary | ICD-10-CM

## 2021-05-24 DIAGNOSIS — R2689 Other abnormalities of gait and mobility: Secondary | ICD-10-CM | POA: Diagnosis not present

## 2021-05-24 DIAGNOSIS — R262 Difficulty in walking, not elsewhere classified: Secondary | ICD-10-CM | POA: Diagnosis not present

## 2021-05-24 DIAGNOSIS — R278 Other lack of coordination: Secondary | ICD-10-CM | POA: Diagnosis not present

## 2021-05-24 MED ORDER — CEPHALEXIN 500 MG PO CAPS: 500.0000 mg | ORAL_CAPSULE | Freq: Two times a day (BID) | ORAL | 5 refills | Status: DC

## 2021-05-24 NOTE — Progress Notes (Signed)
Patient Active Problem List   Diagnosis Date Noted   Septic arthritis ([place]) 04/13/2021   Polyneuropathy associated with underlying disease ([place]) 05/04/2020   Severe nonproliferative diabetic retinopathy of left eye, with macular edema, associated with type 2 diabetes mellitus ([place]) 11/05/2019   Severe nonproliferative diabetic retinopathy of right eye, with macular edema, associated with type 2 diabetes mellitus ([place]) 11/05/2019   Retinal hemorrhage of right eye 11/05/2019   Retinal hemorrhage of left eye 11/05/2019   Type 2 diabetes mellitus with hyperglycemia, with long-term current use of insulin ([place]) 05/14/2019   Diabetes mellitus ([place]) 05/14/2019   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, with long-term current use of insulin ([place]) 05/14/2019   Type 2 diabetes mellitus with diabetic polyneuropathy, with long-term current use of insulin ([place]) 05/14/2019   Encephalopathy acute 05/20/2018   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 05/20/2018   Acute kidney failure ([place]) 05/15/2018   Primary localized osteoarthritis of right knee 05/13/2018   (HFpEF) heart failure with preserved ejection fraction ([name]) 12/20/2017   Obesity (BMI 30-39.9) 11/11/2017   OSA (obstructive sleep apnea) 69/67/8938   Acute systolic CHF (congestive heart failure) ([place]) 02/03/2017   Essential hypertension    Diabetes mellitus without complication (HCC)    Cancer (HCC)    Thyroid disease     Patient's Medications  New Prescriptions   No medications on file  Previous Medications   ACETAMINOPHEN (TYLENOL) 500 MG TABLET    Take 500-1,000 mg by mouth every 6 (six) hours as needed (for pain or headaches).   ALCOHOL SWABS PADS    See admin instructions.   ALLOPURINOL (ZYLOPRIM) 100 MG TABLET    Take 100 mg by mouth daily.   APIXABAN (ELIQUIS) 5 MG TABS TABLET    Take 1 tablet (5 mg total) by mouth 2 (two) times daily.   ATORVASTATIN (LIPITOR) 80 MG TABLET    Take 1  tablet (80 mg total) by mouth at bedtime.   BIOTIN 10 MG TABS    Take 10 mg by mouth daily.   BLOOD GLUCOSE MONITORING SUPPL (ONETOUCH VERIO) W/DEVICE KIT    Use to check blood sugar   CARVEDILOL (COREG) 12.5 MG TABLET    Take 1 tablet (12.5 mg total) by mouth 2 (two) times daily with a meal.   CEFAZOLIN (ANCEF) IVPB    Inject 2 g into the vein every 8 (eight) hours. Indication: right knee prosthetic joint infection First Dose: Yes Last Day of Therapy:  05/26/2021 Labs - Once weekly:  CBC/D and BMP, Labs - Every other week:  ESR and CRP Method of administration: IV Push Method of administration may be changed at the discretion of home infusion pharmacist based upon assessment of the patient and/or caregiver's ability to self-administer the medication ordered.   CLOTRIMAZOLE-BETAMETHASONE (LOTRISONE) LOTION    Apply 1 application topically 2 (two) times daily as needed (itching).   CONTINUOUS BLOOD GLUC SENSOR (DEXCOM G6 SENSOR) MISC    1 Device by Does not apply route as directed.   CONTINUOUS BLOOD GLUC TRANSMIT (DEXCOM G6 TRANSMITTER) MISC    1 Device by Does not apply route as directed.   EMPAGLIFLOZIN (JARDIANCE) 10 MG TABS TABLET    Take 10 mg by mouth daily.   FERROUS SULFATE (IRON) 325 (65 FE) MG TABS    Take 1 tablet (325 mg total) by mouth daily.   GABAPENTIN (NEURONTIN) 300 MG CAPSULE  Take 300 mg by mouth 2 (two) times daily.   INSULIN GLARGINE-YFGN (SEMGLEE) 100 UNIT/ML INJECTION    Inject 0.3 mLs (30 Units total) into the skin daily.   INSULIN LISPRO (HUMALOG) 100 UNIT/ML INJECTION    Inject 0.05 mLs (5 Units total) into the skin 3 (three) times daily before meals.   ISOSORBIDE MONONITRATE (IMDUR) 30 MG 24 HR TABLET    TAKE 1 TABLET BY MOUTH ONCE DAILY   LATANOPROST (XALATAN) 0.005 % OPHTHALMIC SOLUTION    Place 1 drop into both eyes at bedtime.   LEVOTHYROXINE (SYNTHROID, LEVOTHROID) 75 MCG TABLET    Take 75 mcg by mouth daily before breakfast.   LOSARTAN (COZAAR) 25 MG TABLET     Take 1 tablet (25 mg total) by mouth daily.   LUMIGAN 0.01 % SOLN    Place 1 drop into both eyes at bedtime.   MULTIPLE VITAMINS-MINERALS (ALIVE ONCE DAILY WOMENS 50+ PO)    Take 1 tablet by mouth daily.   MUPIROCIN OINTMENT (BACTROBAN) 2 %    Apply to left great toe once daily for 14 days.   POLYETHYLENE GLYCOL (MIRALAX / GLYCOLAX) 17 G PACKET    Take 17 g by mouth daily.   POTASSIUM CHLORIDE (KLOR-CON) 10 MEQ TABLET    Take 2 tablets by mouth once daily   RELION PEN NEEDLE 31G/8MM 31G X 8 MM MISC    Inject 31 g as directed in the morning, at noon, and at bedtime.   TORSEMIDE (DEMADEX) 20 MG TABLET    Take 2 tablets (40 mg total) by mouth daily.   TRAVOPROST, BAK FREE, (TRAVATAN) 0.004 % SOLN OPHTHALMIC SOLUTION    Place 1 drop into both eyes at bedtime.   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 She had undergone right TKA on 05/15/2018(Dr. Landau, ortho). Developed acute right knee pain on day prior to admission.   Arthrocentesis showed cloudy fluid,1600 wbc, 98% neutrophils with Cx+ staph aureus.  She underwent I&D and polyethylene exchange with Ortho(Dr. Zachery Dakins) on 11/18, cultures grew MSSA.  Patient placed on 6 weeks of cefazolin as an EOT 05/25/2021.  Patient presents for follow-up care. Today, pt reports feeling well. Reports her knee feels better. Denies fever, chills, N,V,D.   Review of Systems: Review of Systems  All other systems reviewed and are negative.  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[place]) 02/05/2017   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 05/20/2018   Acute kidney failure ([place]) 05/15/2018   Arthritis    "all over" (05/22/2018)    Blood dyscrasia    per pt-has small blood cells-appears as if anemic   Breast cancer, left breast ([place]) 1985   CHF (congestive heart failure) (HCC)    Complication of anesthesia    difficult to awaken from per pt   Coronary artery disease    Dyspnea    Essential hypertension    Heart disease    Heart murmur    History of gout    Hypothyroidism    Obesity (BMI 30-39.9) 11/11/2017   OSA (obstructive sleep apnea) 02/18/2017    severe obstructive sleep apnea with an AHI of 75.6/h and mild central sleep apnea with  a CAI of 7.7/h.  Oxygen saturations dropped as low as 82%.   He is on CPAP at 9 cm H2O.   OSA on CPAP    Personal history of chemotherapy    Personal history of radiation therapy    Presence of permanent cardiac pacemaker    Primary localized osteoarthritis of right knee 05/13/2018   Refusal of blood transfusions as patient is Jehovah's Witness    Type II diabetes mellitus ([place])     Social History   Tobacco Use   Smoking status: Former    Packs/day: 1.00    Years: 22.00    Pack years: 22.00    Types: Cigarettes    Quit date: 70    Years since quitting: 40.0   Smokeless tobacco: Never  Vaping Use   Vaping Use: Never used  Substance Use Topics   Alcohol use: Yes    Comment: 05/22/2018 "glass of wine couple times/wk"   Drug use: Not Currently    Family History  Problem Relation Age of Onset   Multiple myeloma Mother    Heart disease Father    Other Sister    Heart disease Brother    Diabetes Sister    Other Sister     Allergies  Allergen Reactions   Other Other (See Comments)    Blood Product Refusal (Jehovah's witness)     Sulfa Antibiotics Rash   Uloric [Febuxostat] Rash    Health Maintenance  Topic Date Due   Pneumonia Vaccine 59+ Years old (1 - PCV) Never done   OPHTHALMOLOGY EXAM  Never done   Hepatitis C Screening  Never done   TETANUS/TDAP  Never done   Zoster Vaccines- Shingrix (1 of 2) Never done   COLONOSCOPY (Pts 45-30yr Insurance  coverage will need to be confirmed)  Never done   DEXA SCAN  Never done   FOOT EXAM  10/18/2020   COVID-19 Vaccine (4 - Booster for Pfizer series) 11/02/2020   HEMOGLOBIN A1C  10/11/2021   MAMMOGRAM  03/04/2023   INFLUENZA VACCINE  Completed   HPV VACCINES  Aged Out    Objective:  There were no vitals filed for this visit. There is no height or weight on file to calculate BMI.  Physical Exam Constitutional:      Appearance: Normal appearance.  HENT:     Head: Normocephalic and atraumatic.     Right Ear: Tympanic membrane normal.     Left Ear: Tympanic membrane normal.     Nose: Nose normal.     Mouth/Throat:     Mouth: Mucous membranes are moist.  Eyes:     Extraocular Movements: Extraocular movements intact.     Conjunctiva/sclera: Conjunctivae normal.     Pupils: Pupils are equal, round, and reactive to light.  Cardiovascular:     Rate and Rhythm: Normal rate and regular rhythm.     Heart sounds: No murmur heard.   No friction rub. No gallop.  Pulmonary:     Effort: Pulmonary effort is normal.     Breath sounds: Normal breath sounds.  Abdominal:     General: Abdomen is flat.     Palpations: Abdomen is soft.  Musculoskeletal:        General: Normal range of motion.     Comments: Right knee incision C/D/I  Skin:    General: Skin is warm and dry.  Neurological:     General: No focal deficit present.     Mental Status: She is alert and oriented  to person, place, and time.  Psychiatric:        Mood and Affect: Mood normal.    Lab Results Lab Results  Component Value Date   WBC 7.8 04/24/2021   HGB 7.0 (L) 04/24/2021   HCT 23.4 (L) 04/24/2021   MCV 69.2 (L) 04/24/2021   PLT 344 04/24/2021    Lab Results  Component Value Date   CREATININE 1.17 (H) 04/24/2021   BUN 18 04/24/2021   NA 136 04/24/2021   K 4.1 04/24/2021   CL 105 04/24/2021   CO2 26 04/24/2021    Lab Results  Component Value Date   ALT 22 04/13/2021   AST 25 04/13/2021   ALKPHOS 56  04/13/2021   BILITOT 0.8 04/13/2021    Lab Results  Component Value Date   CHOL 150 10/20/2019   HDL 44 10/20/2019   LDLCALC 65 10/20/2019   TRIG 206 (H) 10/20/2019   CHOLHDL 3.4 10/20/2019   No results found for: LABRPR, RPRTITER No results found for: HIV1RNAQUANT, HIV1RNAVL, CD4TABS Assessment: #Right knee PJI SP I&D with poly exchange on 11/18 , Cx+ MSSA -Pt currently at a facility, completing rehab. Plan: -Complete 6 weeks of antibiotics 05/25/21 -Pull PICC after last dose of antibiotics -Close Ortho follow-up -Start keflex 562m PO bid for suppressive antibiotics -Follow-up in 2 months    MLaurice Record MStevens Villagefor Infectious [facility] 05/24/2021, 8:41 AM

## 2021-05-25 DIAGNOSIS — R278 Other lack of coordination: Secondary | ICD-10-CM | POA: Diagnosis not present

## 2021-05-25 DIAGNOSIS — R2689 Other abnormalities of gait and mobility: Secondary | ICD-10-CM | POA: Diagnosis not present

## 2021-05-25 DIAGNOSIS — M00061 Staphylococcal arthritis, right knee: Secondary | ICD-10-CM | POA: Diagnosis not present

## 2021-05-25 DIAGNOSIS — M6281 Muscle weakness (generalized): Secondary | ICD-10-CM | POA: Diagnosis not present

## 2021-05-25 DIAGNOSIS — R262 Difficulty in walking, not elsewhere classified: Secondary | ICD-10-CM | POA: Diagnosis not present

## 2021-05-26 DIAGNOSIS — E1169 Type 2 diabetes mellitus with other specified complication: Secondary | ICD-10-CM | POA: Diagnosis not present

## 2021-05-26 DIAGNOSIS — I152 Hypertension secondary to endocrine disorders: Secondary | ICD-10-CM | POA: Diagnosis not present

## 2021-05-26 DIAGNOSIS — E611 Iron deficiency: Secondary | ICD-10-CM | POA: Diagnosis not present

## 2021-05-26 DIAGNOSIS — J310 Chronic rhinitis: Secondary | ICD-10-CM | POA: Diagnosis not present

## 2021-05-26 DIAGNOSIS — E1159 Type 2 diabetes mellitus with other circulatory complications: Secondary | ICD-10-CM | POA: Diagnosis not present

## 2021-05-26 DIAGNOSIS — E669 Obesity, unspecified: Secondary | ICD-10-CM | POA: Diagnosis not present

## 2021-05-26 DIAGNOSIS — I5040 Unspecified combined systolic (congestive) and diastolic (congestive) heart failure: Secondary | ICD-10-CM | POA: Diagnosis not present

## 2021-05-26 DIAGNOSIS — M009 Pyogenic arthritis, unspecified: Secondary | ICD-10-CM | POA: Diagnosis not present

## 2021-05-26 DIAGNOSIS — N183 Chronic kidney disease, stage 3 unspecified: Secondary | ICD-10-CM | POA: Diagnosis not present

## 2021-05-26 DIAGNOSIS — R29898 Other symptoms and signs involving the musculoskeletal system: Secondary | ICD-10-CM | POA: Diagnosis not present

## 2021-05-27 DIAGNOSIS — I130 Hypertensive heart and chronic kidney disease with heart failure and stage 1 through stage 4 chronic kidney disease, or unspecified chronic kidney disease: Secondary | ICD-10-CM | POA: Diagnosis not present

## 2021-05-27 DIAGNOSIS — B9561 Methicillin susceptible Staphylococcus aureus infection as the cause of diseases classified elsewhere: Secondary | ICD-10-CM | POA: Diagnosis not present

## 2021-05-27 DIAGNOSIS — E1142 Type 2 diabetes mellitus with diabetic polyneuropathy: Secondary | ICD-10-CM | POA: Diagnosis not present

## 2021-05-27 DIAGNOSIS — G9340 Encephalopathy, unspecified: Secondary | ICD-10-CM | POA: Diagnosis not present

## 2021-05-27 DIAGNOSIS — E1165 Type 2 diabetes mellitus with hyperglycemia: Secondary | ICD-10-CM | POA: Diagnosis not present

## 2021-05-27 DIAGNOSIS — M009 Pyogenic arthritis, unspecified: Secondary | ICD-10-CM | POA: Diagnosis not present

## 2021-05-27 DIAGNOSIS — E113413 Type 2 diabetes mellitus with severe nonproliferative diabetic retinopathy with macular edema, bilateral: Secondary | ICD-10-CM | POA: Diagnosis not present

## 2021-05-27 DIAGNOSIS — M1711 Unilateral primary osteoarthritis, right knee: Secondary | ICD-10-CM | POA: Diagnosis not present

## 2021-05-27 DIAGNOSIS — M00061 Staphylococcal arthritis, right knee: Secondary | ICD-10-CM | POA: Diagnosis not present

## 2021-05-27 DIAGNOSIS — H3563 Retinal hemorrhage, bilateral: Secondary | ICD-10-CM | POA: Diagnosis not present

## 2021-05-27 DIAGNOSIS — I5043 Acute on chronic combined systolic (congestive) and diastolic (congestive) heart failure: Secondary | ICD-10-CM | POA: Diagnosis not present

## 2021-05-29 ENCOUNTER — Other Ambulatory Visit (HOSPITAL_COMMUNITY): Payer: Self-pay

## 2021-05-29 DIAGNOSIS — M009 Pyogenic arthritis, unspecified: Secondary | ICD-10-CM | POA: Diagnosis not present

## 2021-05-29 NOTE — Progress Notes (Signed)
Paramedicine Encounter    Patient ID: Anmarie Fukushima, female    DOB: November 17, 1945, 76 y.o.   MRN: [id number]   Shatoya called me requesting assistance with her Dexcom. She stated she was released from [facility] and was sent home and the home health case manager who came out yesterday did not know how to assist with Dexcom and Amiria admits she doesn't know how to put it on. I arrived at her home and assisted with this. She also stated she is very confused on her medications since being sent back home. I reviewed these and assisted her in filling two pill boxes. She is currently not enrolled in paramedicine due to her being placed in SNF last month but requests being added back to paramedicine. I advised her she would need an appointment with APP or MD in clinic to be re-assigned. She agreed. I will have clinic schedule her an appointment as soon as their able. She agreed.   Visit complete. I will follow up once she is re-enrolled.   ACTION: Home visit completed

## 2021-05-30 ENCOUNTER — Other Ambulatory Visit (HOSPITAL_COMMUNITY): Payer: Self-pay

## 2021-05-30 MED ORDER — POTASSIUM CHLORIDE CRYS ER 10 MEQ PO TBCR: 20.0000 meq | EXTENDED_RELEASE_TABLET | Freq: Every day | ORAL | 11 refills | Status: DC

## 2021-05-30 MED ORDER — LOSARTAN POTASSIUM 25 MG PO TABS: 25.0000 mg | ORAL_TABLET | Freq: Every day | ORAL | 6 refills | Status: DC

## 2021-05-31 ENCOUNTER — Ambulatory Visit: Payer: Medicare HMO | Admitting: Internal Medicine

## 2021-05-31 DIAGNOSIS — M00061 Staphylococcal arthritis, right knee: Secondary | ICD-10-CM | POA: Diagnosis not present

## 2021-05-31 DIAGNOSIS — G9340 Encephalopathy, unspecified: Secondary | ICD-10-CM | POA: Diagnosis not present

## 2021-05-31 DIAGNOSIS — B9561 Methicillin susceptible Staphylococcus aureus infection as the cause of diseases classified elsewhere: Secondary | ICD-10-CM | POA: Diagnosis not present

## 2021-05-31 DIAGNOSIS — E1142 Type 2 diabetes mellitus with diabetic polyneuropathy: Secondary | ICD-10-CM | POA: Diagnosis not present

## 2021-05-31 DIAGNOSIS — I130 Hypertensive heart and chronic kidney disease with heart failure and stage 1 through stage 4 chronic kidney disease, or unspecified chronic kidney disease: Secondary | ICD-10-CM | POA: Diagnosis not present

## 2021-05-31 DIAGNOSIS — I5043 Acute on chronic combined systolic (congestive) and diastolic (congestive) heart failure: Secondary | ICD-10-CM | POA: Diagnosis not present

## 2021-05-31 DIAGNOSIS — E1165 Type 2 diabetes mellitus with hyperglycemia: Secondary | ICD-10-CM | POA: Diagnosis not present

## 2021-05-31 DIAGNOSIS — M1711 Unilateral primary osteoarthritis, right knee: Secondary | ICD-10-CM | POA: Diagnosis not present

## 2021-05-31 DIAGNOSIS — E113413 Type 2 diabetes mellitus with severe nonproliferative diabetic retinopathy with macular edema, bilateral: Secondary | ICD-10-CM | POA: Diagnosis not present

## 2021-05-31 DIAGNOSIS — H3563 Retinal hemorrhage, bilateral: Secondary | ICD-10-CM | POA: Diagnosis not present

## 2021-06-02 DIAGNOSIS — E113413 Type 2 diabetes mellitus with severe nonproliferative diabetic retinopathy with macular edema, bilateral: Secondary | ICD-10-CM | POA: Diagnosis not present

## 2021-06-02 DIAGNOSIS — E1165 Type 2 diabetes mellitus with hyperglycemia: Secondary | ICD-10-CM | POA: Diagnosis not present

## 2021-06-02 DIAGNOSIS — B9561 Methicillin susceptible Staphylococcus aureus infection as the cause of diseases classified elsewhere: Secondary | ICD-10-CM | POA: Diagnosis not present

## 2021-06-02 DIAGNOSIS — I5043 Acute on chronic combined systolic (congestive) and diastolic (congestive) heart failure: Secondary | ICD-10-CM | POA: Diagnosis not present

## 2021-06-02 DIAGNOSIS — E1142 Type 2 diabetes mellitus with diabetic polyneuropathy: Secondary | ICD-10-CM | POA: Diagnosis not present

## 2021-06-02 DIAGNOSIS — H3563 Retinal hemorrhage, bilateral: Secondary | ICD-10-CM | POA: Diagnosis not present

## 2021-06-02 DIAGNOSIS — M00061 Staphylococcal arthritis, right knee: Secondary | ICD-10-CM | POA: Diagnosis not present

## 2021-06-02 DIAGNOSIS — I130 Hypertensive heart and chronic kidney disease with heart failure and stage 1 through stage 4 chronic kidney disease, or unspecified chronic kidney disease: Secondary | ICD-10-CM | POA: Diagnosis not present

## 2021-06-02 DIAGNOSIS — M1711 Unilateral primary osteoarthritis, right knee: Secondary | ICD-10-CM | POA: Diagnosis not present

## 2021-06-02 DIAGNOSIS — G9340 Encephalopathy, unspecified: Secondary | ICD-10-CM | POA: Diagnosis not present

## 2021-06-06 DIAGNOSIS — M25561 Pain in right knee: Secondary | ICD-10-CM | POA: Diagnosis not present

## 2021-06-07 DIAGNOSIS — M00061 Staphylococcal arthritis, right knee: Secondary | ICD-10-CM | POA: Diagnosis not present

## 2021-06-07 DIAGNOSIS — M1711 Unilateral primary osteoarthritis, right knee: Secondary | ICD-10-CM | POA: Diagnosis not present

## 2021-06-07 DIAGNOSIS — B9561 Methicillin susceptible Staphylococcus aureus infection as the cause of diseases classified elsewhere: Secondary | ICD-10-CM | POA: Diagnosis not present

## 2021-06-07 DIAGNOSIS — G9340 Encephalopathy, unspecified: Secondary | ICD-10-CM | POA: Diagnosis not present

## 2021-06-07 DIAGNOSIS — I5043 Acute on chronic combined systolic (congestive) and diastolic (congestive) heart failure: Secondary | ICD-10-CM | POA: Diagnosis not present

## 2021-06-07 DIAGNOSIS — H3563 Retinal hemorrhage, bilateral: Secondary | ICD-10-CM | POA: Diagnosis not present

## 2021-06-07 DIAGNOSIS — E113413 Type 2 diabetes mellitus with severe nonproliferative diabetic retinopathy with macular edema, bilateral: Secondary | ICD-10-CM | POA: Diagnosis not present

## 2021-06-07 DIAGNOSIS — E1142 Type 2 diabetes mellitus with diabetic polyneuropathy: Secondary | ICD-10-CM | POA: Diagnosis not present

## 2021-06-07 DIAGNOSIS — E1165 Type 2 diabetes mellitus with hyperglycemia: Secondary | ICD-10-CM | POA: Diagnosis not present

## 2021-06-07 DIAGNOSIS — I130 Hypertensive heart and chronic kidney disease with heart failure and stage 1 through stage 4 chronic kidney disease, or unspecified chronic kidney disease: Secondary | ICD-10-CM | POA: Diagnosis not present

## 2021-06-08 DIAGNOSIS — I2510 Atherosclerotic heart disease of native coronary artery without angina pectoris: Secondary | ICD-10-CM | POA: Insufficient documentation

## 2021-06-08 DIAGNOSIS — R001 Bradycardia, unspecified: Secondary | ICD-10-CM | POA: Insufficient documentation

## 2021-06-08 DIAGNOSIS — Z531 Procedure and treatment not carried out because of patient's decision for reasons of belief and group pressure: Secondary | ICD-10-CM | POA: Insufficient documentation

## 2021-06-08 DIAGNOSIS — I480 Paroxysmal atrial fibrillation: Secondary | ICD-10-CM | POA: Insufficient documentation

## 2021-06-08 DIAGNOSIS — N183 Chronic kidney disease, stage 3 unspecified: Secondary | ICD-10-CM | POA: Insufficient documentation

## 2021-06-08 DIAGNOSIS — I779 Disorder of arteries and arterioles, unspecified: Secondary | ICD-10-CM | POA: Insufficient documentation

## 2021-06-08 NOTE — Progress Notes (Deleted)
Cardiology Office Note:    Date:  06/08/2021   ID:  Danielle [name], DOB 01/23/1946, MRN [id number]  PCP:  Seward Carol, MD  [facility] Providers Cardiologist:  Glori Bickers, MD Electrophysiologist:  Constance Haw, MD  Sleep Medicine:  Fransico Him, MD { Click to update primary MD,subspecialty MD or APP then REFRESH:1}  *** Referring MD: Seward Carol, MD   Chief Complaint:  No chief complaint on file. {Click here for Visit Info    :1}   Patient Profile: HFmrEF (heart failure with mildly reduced ejection fraction)  Ischemic CM EF improved from 10 >> 45-50 Coronary artery disease  S/p multiple stents in [place] Hx of LAD stent thrombosis Paroxysmal atrial fibrillation  Carotid artery disease Symptomatic bradycardia  S/p Pacemaker Hypertension  Diabetes mellitus  Chronic kidney disease 3b Hypothyroidism  OSA L Breast CA S/p lumpectomy; chemoTx Jehovah's Witness (cannot accept blood transfusions) Fe Def Anemia   Prior CV Studies: Echocardiogram 04/29/2020 EF 45-50, mild concentric LVH, GRII DD, apical inferior/anterior and septal HK, normal RVSF, mildly elevated PASP, RVSP 39.2, severe LAE, mild-moderate MR, mild MS (mean gradient 4 mmHg), mild AI, mild AV sclerosis without stenosis  Carotid US 08/08/2016 [facility], [place]) Moderate bilateral ICA stenosis  Myoview 01/31/2016 [facility] CV imaging, [place], [place]) No ischemia, EF 51    History of Present Illness:   Danielle [name] is a 76 y.o. female with the above problem list.  She is primarily followed in the CHF Clinic with Dr. Haroldine Laws.  Dr. Radford Pax sees her for OSA and Dr. Curt Bears for her pacer.   She was last in the CHF clinic in 5/22.  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  She required I&D and poly-exchange of infected right total knee arthroplasty.  Her spironolactone was held and her losartan  and carvedilol doses were both reduced due to low blood pressures.  She was given 1 dose of furosemide on admission due to leg edema but this was held later due to AKI.  Torsemide was resumed at discharge.  She is scheduled for follow-up in general cardiology clinic today as well as CHF clinic next week.  ***    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[place]) 02/05/2017   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 05/20/2018   Acute kidney failure ([place]) 05/15/2018   Arthritis    "all over" (05/22/2018)   Blood dyscrasia    per pt-has small blood cells-appears as if anemic   Breast cancer, left breast ([place]) 1985   CHF (congestive heart failure) (HCC)    Complication of anesthesia    difficult to awaken from per pt   Coronary artery disease    Dyspnea    Essential hypertension    Heart disease    Heart murmur    History of gout    Hypothyroidism    Obesity (BMI 30-39.9) 11/11/2017   OSA (obstructive sleep apnea) 02/18/2017    severe obstructive sleep apnea with an AHI of 75.6/h and mild central sleep apnea with a CAI of 7.7/h.  Oxygen saturations dropped as low as 82%.   He is on CPAP at 9 cm H2O.   OSA on CPAP    Personal history of chemotherapy    Personal history of radiation therapy    Presence of permanent cardiac pacemaker    Primary localized osteoarthritis of  right knee 05/13/2018   Refusal of blood transfusions as patient is Jehovah's Witness    Type II diabetes mellitus ([place])    Current Medications: No outpatient medications have been marked as taking for the 06/09/21 encounter (Appointment) with Richardson Dopp T, PA-C.    Allergies:   Other, Sulfa antibiotics, and Uloric [febuxostat]   Social History   Tobacco Use   Smoking status: Former    Packs/day: 1.00    Years: 22.00    Pack years: 22.00    Types: Cigarettes    Quit date: 67    Years since quitting: 40.0   Smokeless tobacco: Never   Vaping Use   Vaping Use: Never used  Substance Use Topics   Alcohol use: Yes    Comment: 05/22/2018 "glass of wine couple times/wk"   Drug use: Not Currently    Family Hx: The patient's family history includes Diabetes in her sister; Heart disease in her brother and father; Multiple myeloma in her mother; Other in her sister and sister.  ROS   EKGs/Labs/Other Test Reviewed:    EKG:  EKG is *** ordered today.  The ekg ordered today demonstrates ***  Recent Labs: 04/13/2021: ALT 22 04/24/2021: BUN 18; Creatinine, Ser 1.17; Hemoglobin 7.0; Magnesium 2.6; Platelets 344; Potassium 4.1; Sodium 136   Recent Lipid Panel No results for input(s): CHOL, TRIG, HDL, VLDL, LDLCALC, LDLDIRECT in the last 8760 hours.   Risk Assessment/Calculations:   {Does this patient have ATRIAL FIBRILLATION?[phone]}     Physical Exam:    VS:  There were no vitals taken for this visit.    Wt Readings from Last 3 Encounters:  04/13/21 223 lb (101.2 kg)  04/10/21 223 lb (101.2 kg)  03/20/21 216 lb (98 kg)    Physical Exam ***     ASSESSMENT & PLAN:   No problem-specific Assessment & Plan notes found for this encounter.        {Are you ordering a CV Procedure (e.g. stress test, cath, DCCV, TEE, etc)?   Press F2        :[id number]}  Dispo:  No follow-ups on file.   Medication Adjustments/Labs and Tests Ordered: Current medicines are reviewed at length with the patient today.  Concerns regarding medicines are outlined above.  Tests Ordered: No orders of the defined types were placed in this encounter.  Medication Changes: No orders of the defined types were placed in this encounter.  Signed, Richardson Dopp, PA-C  06/08/2021 10:12 PM    [facility] [place], [place], [place]  [address] Phone: [phone]; Fax: [phone]

## 2021-06-09 ENCOUNTER — Ambulatory Visit: Payer: Medicare HMO | Admitting: Physician Assistant

## 2021-06-09 ENCOUNTER — Other Ambulatory Visit: Payer: Self-pay

## 2021-06-09 DIAGNOSIS — I5043 Acute on chronic combined systolic (congestive) and diastolic (congestive) heart failure: Secondary | ICD-10-CM | POA: Diagnosis not present

## 2021-06-09 DIAGNOSIS — I6523 Occlusion and stenosis of bilateral carotid arteries: Secondary | ICD-10-CM

## 2021-06-09 DIAGNOSIS — M1711 Unilateral primary osteoarthritis, right knee: Secondary | ICD-10-CM | POA: Diagnosis not present

## 2021-06-09 DIAGNOSIS — D631 Anemia in chronic kidney disease: Secondary | ICD-10-CM | POA: Diagnosis not present

## 2021-06-09 DIAGNOSIS — N1832 Chronic kidney disease, stage 3b: Secondary | ICD-10-CM

## 2021-06-09 DIAGNOSIS — E1165 Type 2 diabetes mellitus with hyperglycemia: Secondary | ICD-10-CM | POA: Diagnosis not present

## 2021-06-09 DIAGNOSIS — E113413 Type 2 diabetes mellitus with severe nonproliferative diabetic retinopathy with macular edema, bilateral: Secondary | ICD-10-CM | POA: Diagnosis not present

## 2021-06-09 DIAGNOSIS — B9561 Methicillin susceptible Staphylococcus aureus infection as the cause of diseases classified elsewhere: Secondary | ICD-10-CM | POA: Diagnosis not present

## 2021-06-09 DIAGNOSIS — E1142 Type 2 diabetes mellitus with diabetic polyneuropathy: Secondary | ICD-10-CM | POA: Diagnosis not present

## 2021-06-09 DIAGNOSIS — H3563 Retinal hemorrhage, bilateral: Secondary | ICD-10-CM | POA: Diagnosis not present

## 2021-06-09 DIAGNOSIS — I10 Essential (primary) hypertension: Secondary | ICD-10-CM | POA: Diagnosis not present

## 2021-06-09 DIAGNOSIS — G9340 Encephalopathy, unspecified: Secondary | ICD-10-CM | POA: Diagnosis not present

## 2021-06-09 DIAGNOSIS — I130 Hypertensive heart and chronic kidney disease with heart failure and stage 1 through stage 4 chronic kidney disease, or unspecified chronic kidney disease: Secondary | ICD-10-CM | POA: Diagnosis not present

## 2021-06-09 DIAGNOSIS — I5032 Chronic diastolic (congestive) heart failure: Secondary | ICD-10-CM

## 2021-06-09 DIAGNOSIS — I2510 Atherosclerotic heart disease of native coronary artery without angina pectoris: Secondary | ICD-10-CM

## 2021-06-09 DIAGNOSIS — M00061 Staphylococcal arthritis, right knee: Secondary | ICD-10-CM | POA: Diagnosis not present

## 2021-06-09 DIAGNOSIS — I480 Paroxysmal atrial fibrillation: Secondary | ICD-10-CM

## 2021-06-09 DIAGNOSIS — R001 Bradycardia, unspecified: Secondary | ICD-10-CM

## 2021-06-12 ENCOUNTER — Other Ambulatory Visit (HOSPITAL_COMMUNITY): Payer: Self-pay

## 2021-06-12 NOTE — Progress Notes (Signed)
Paramedicine Encounter    Patient ID: Chasey Dull, female    DOB: Jun 02, 1945, 76 y.o.   MRN: [id number]  Mrs. Manders reached out needing assistance with [facility] application. I arrived and noted that the transmitter is expired and needing a new one in order to successfully use her device. I contacted [employer] who ships her supplies and requested a new transmitter be sent out.   She had her pill box on the table and her sister asked me to check it as they filled it earlier in the day.   I checked for accuracy and noted many pills in the box that were placed wrongly. (Including methocarbamol placed twice daily when instructed as needed at bedtime for pain)   I accessed Mrs. Varnum's medication list and verified all medications. I emptied this pill box and filled it correctly based off her current med list.   She is scheduled to follow up in clinic on Wednesday for post SNF follow up and I will be recommending re-enrollment in paramedicine. I will send note to LCSW Tammy Sours for same .   I confirmed upcoming appointments for her and her sister and wrote them down confirming she has transportation for same.    Home visit complete. I will return once paramedicine referral is sent out.   Refills: Losartan (needs refills)  ACTION: Home visit completed

## 2021-06-13 ENCOUNTER — Other Ambulatory Visit (HOSPITAL_COMMUNITY): Payer: Self-pay

## 2021-06-13 DIAGNOSIS — E1165 Type 2 diabetes mellitus with hyperglycemia: Secondary | ICD-10-CM | POA: Diagnosis not present

## 2021-06-13 MED ORDER — LOSARTAN POTASSIUM 25 MG PO TABS: 25.0000 mg | ORAL_TABLET | Freq: Every day | ORAL | 6 refills | Status: DC

## 2021-06-13 MED ORDER — CARVEDILOL 12.5 MG PO TABS: 12.5000 mg | ORAL_TABLET | Freq: Two times a day (BID) | ORAL | 4 refills | Status: DC

## 2021-06-14 ENCOUNTER — Encounter (HOSPITAL_COMMUNITY): Payer: Medicare HMO

## 2021-06-14 DIAGNOSIS — M1711 Unilateral primary osteoarthritis, right knee: Secondary | ICD-10-CM | POA: Diagnosis not present

## 2021-06-14 DIAGNOSIS — E113413 Type 2 diabetes mellitus with severe nonproliferative diabetic retinopathy with macular edema, bilateral: Secondary | ICD-10-CM | POA: Diagnosis not present

## 2021-06-14 DIAGNOSIS — E1142 Type 2 diabetes mellitus with diabetic polyneuropathy: Secondary | ICD-10-CM | POA: Diagnosis not present

## 2021-06-14 DIAGNOSIS — M00061 Staphylococcal arthritis, right knee: Secondary | ICD-10-CM | POA: Diagnosis not present

## 2021-06-14 DIAGNOSIS — E1165 Type 2 diabetes mellitus with hyperglycemia: Secondary | ICD-10-CM | POA: Diagnosis not present

## 2021-06-14 DIAGNOSIS — I5043 Acute on chronic combined systolic (congestive) and diastolic (congestive) heart failure: Secondary | ICD-10-CM | POA: Diagnosis not present

## 2021-06-14 DIAGNOSIS — H3563 Retinal hemorrhage, bilateral: Secondary | ICD-10-CM | POA: Diagnosis not present

## 2021-06-14 DIAGNOSIS — I130 Hypertensive heart and chronic kidney disease with heart failure and stage 1 through stage 4 chronic kidney disease, or unspecified chronic kidney disease: Secondary | ICD-10-CM | POA: Diagnosis not present

## 2021-06-14 DIAGNOSIS — G9340 Encephalopathy, unspecified: Secondary | ICD-10-CM | POA: Diagnosis not present

## 2021-06-14 DIAGNOSIS — B9561 Methicillin susceptible Staphylococcus aureus infection as the cause of diseases classified elsewhere: Secondary | ICD-10-CM | POA: Diagnosis not present

## 2021-06-14 NOTE — Progress Notes (Addendum)
PCP: Seward Carol, MD EP: Dr. Curt Bears Rheumatology: Dr. Posey Rea Primary HF Cardiologist: Dr. Haroldine Laws  HPI: Danielle [name] is a 76 y.o. female with a past medical history of systolic HF (EF 44-01%), CAD s/p multiple stents in [place] (with h/o LAD stent thrombosis), ischemic cardiomyopathy (EF 10% at one point), symptomatic bradycardia s/p PPM, HTN, DM, hypothyroidism, and OSA.   Admitted 9/18 with SOB, acute on chronic systolic CHF. Diuresed with IV lasix (12 pounds), discharge weight was 215 pounds.    Had R TKR 12/19.    Last seen 5/22. Volume slightly up. Torsemide increased for a couple of days.   Admitted 11/22 with right knee septic arthritis. Arthrocentesis culture grew staph aureus.  Underwent I&D and polyexchange of infected right total knee arthroplasty.  Treated with 6 weeks IV antibiotics followed by suppressive abx. Aldactone held, losartan reduced to 25 mg daily and coreg reduced to 12.5 mg BID d/t soft BP. She was given 1 dose of furosemide on admission due to leg edema but this was held later due to AKI.  Torsemide was resumed at discharge.  Initial went to SNF after discharge but now back home. Getting home PT.  She was seen by Paramedicine 01/16. Medications had been placed in pill boxes incorrectly by patient, replaced correctly by EMT. Needs new referral for Paramedicine program.   Here today for CHF follow-up. She is accompanied by her sister, Danielle [name].  Denies CP or dyspnea. No orthopnea or PND. Has some abdominal bloating but thinks it is due to constipation. Going to pick up stool softeners after today's visit.  Weight has been averaging around 206 lb, up to 209 lb at home today. Clinic weight 211 lb (had been 219 lb at last visit May 2022). Taking all medications as prescribed. She is very diligent about sodium intake, eating fresh vegetables, chicken and fish.   Had labs with her PCP earlier this week.   ECHO 01/2017- EF 45-50% Grade 2DD Left Atrium Severely dilated.   ECHO 12/2017 EF 45-50%, G2DD, LA mildly dilated, trileaflet moderately thick aortic valve Echo 12/21 EF 45-50% G2DD   ROS: All systems negative except as listed in HPI, PMH and Problem List.  SH:  Social History   Socioeconomic History   Marital status: Widowed    Spouse name: Not on file   Number of children: 3   Years of education: Not on file   Highest education level: Some college, no degree  Occupational History   Occupation: retired  Tobacco Use   Smoking status: Former    Packs/day: 1.00    Years: 22.00    Pack years: 22.00    Types: Cigarettes    Quit date: 1983    Years since quitting: 40.0   Smokeless tobacco: Never  Vaping Use   Vaping Use: Never used  Substance and Sexual Activity   Alcohol use: Yes    Comment: 05/22/2018 "glass of wine couple times/wk"   Drug use: Not Currently   Sexual activity: Not Currently    Birth control/protection: None, Post-menopausal  Other Topics Concern   Not on file  Social History Narrative   Not on file   Social Determinants of Health   Financial Resource Strain: Not on file  Food Insecurity: Not on file  Transportation Needs: Not on file  Physical Activity: Not on file  Stress: Not on file  Social Connections: Not on file  Intimate Partner Violence: Not on file    FH:  Family History  Problem Relation Age  of Onset   Multiple myeloma Mother    Heart disease Father    Other Sister    Heart disease Brother    Diabetes Sister    Other Sister     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[place]) 02/05/2017   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 05/20/2018   Acute kidney failure ([place]) 05/15/2018   Arthritis    "all over" (05/22/2018)   Blood dyscrasia    per pt-has small blood cells-appears as if anemic   Breast cancer, left breast ([place]) 1985   CHF (congestive heart failure) (HCC)    Complication of anesthesia    difficult to  awaken from per pt   Coronary artery disease    Dyspnea    Essential hypertension    Heart disease    Heart murmur    History of gout    Hypothyroidism    Obesity (BMI 30-39.9) 11/11/2017   OSA (obstructive sleep apnea) 02/18/2017    severe obstructive sleep apnea with an AHI of 75.6/h and mild central sleep apnea with a CAI of 7.7/h.  Oxygen saturations dropped as low as 82%.   He is on CPAP at 9 cm H2O.   OSA on CPAP    Personal history of chemotherapy    Personal history of radiation therapy    Presence of permanent cardiac pacemaker    Primary localized osteoarthritis of right knee 05/13/2018   Refusal of blood transfusions as patient is Jehovah's Witness    Type II diabetes mellitus ([place])     Current Outpatient Medications  Medication Sig Dispense Refill   acetaminophen (TYLENOL) 500 MG tablet Take 500-1,000 mg by mouth every 6 (six) hours as needed (for pain or headaches).     Alcohol Swabs PADS See admin instructions.     allopurinol (ZYLOPRIM) 100 MG tablet Take 100 mg by mouth daily.     apixaban (ELIQUIS) 5 MG TABS tablet Take 1 tablet (5 mg total) by mouth 2 (two) times daily. 60 tablet 11   atorvastatin (LIPITOR) 80 MG tablet Take 1 tablet (80 mg total) by mouth at bedtime. 90 tablet 3   Biotin 10 MG TABS Take 10 mg by mouth daily.     Blood Glucose Monitoring Suppl (ONETOUCH VERIO) w/Device KIT Use to check blood sugar 1 kit 0   carvedilol (COREG) 12.5 MG tablet Take 1 tablet (12.5 mg total) by mouth 2 (two) times daily with a meal. 60 tablet 4   cephALEXin (KEFLEX) 500 MG capsule Take 1 capsule (500 mg total) by mouth 2 (two) times daily. 60 capsule 5   clotrimazole-betamethasone (LOTRISONE) lotion Apply 1 application topically 2 (two) times daily as needed (itching).     Continuous Blood Gluc Sensor (DEXCOM G6 SENSOR) MISC 1 Device by Does not apply route as directed. 9 each 3   Continuous Blood Gluc Transmit (DEXCOM G6 TRANSMITTER) MISC 1 Device by Does not apply route  as directed. 1 each 3   empagliflozin (JARDIANCE) 10 MG TABS tablet Take 10 mg by mouth daily.     Ferrous Sulfate (IRON) 325 (65 Fe) MG TABS Take 1 tablet (325 mg total) by mouth daily. 30 tablet 0   gabapentin (NEURONTIN) 300 MG capsule Take 300 mg by mouth 2 (two) times daily.     insulin glargine-yfgn (SEMGLEE) 100 UNIT/ML injection Inject 0.3 mLs (30 Units total) into the skin daily. 10 mL 0  insulin lispro (HUMALOG) 100 UNIT/ML injection Inject 0.05 mLs (5 Units total) into the skin 3 (three) times daily before meals. 10 mL 0   latanoprost (XALATAN) 0.005 % ophthalmic solution Place 1 drop into both eyes at bedtime.     levothyroxine (SYNTHROID, LEVOTHROID) 75 MCG tablet Take 75 mcg by mouth daily before breakfast.     losartan (COZAAR) 25 MG tablet Take 1 tablet (25 mg total) by mouth daily. 30 tablet 6   LUMIGAN 0.01 % SOLN Place 1 drop into both eyes at bedtime.     Multiple Vitamins-Minerals (ALIVE ONCE DAILY WOMENS 50+ PO) Take 1 tablet by mouth daily.     polyethylene glycol (MIRALAX / GLYCOLAX) 17 g packet Take 17 g by mouth daily. 14 each 0   potassium chloride (KLOR-CON M) 10 MEQ tablet Take 2 tablets (20 mEq total) by mouth daily. 60 tablet 11   RELION PEN NEEDLE 31G/8MM 31G X 8 MM MISC Inject 31 g as directed in the morning, at noon, and at bedtime.     spironolactone (ALDACTONE) 25 MG tablet Take 0.5 tablets (12.5 mg total) by mouth daily. 45 tablet 3   torsemide (DEMADEX) 20 MG tablet Take 2 tablets (40 mg total) by mouth daily. 60 tablet 11   Travoprost, BAK Free, (TRAVATAN) 0.004 % SOLN ophthalmic solution Place 1 drop into both eyes at bedtime.      No current facility-administered medications for this encounter.    Vitals:   06/15/21 1019  BP: 104/60  Pulse: 69  SpO2: 97%  Weight: 96 kg (211 lb 9.6 oz)    PHYSICAL EXAM:  General: No distress. Ambulated into clinic with walker HEENT: normal Neck: supple. JVP difficult to assess. Carotids 2+ bilat; no bruits. No  lymphadenopathy or thryomegaly appreciated. Cor: PMI nondisplaced. Regular rate & rhythm. No rubs, gallops or murmurs. Lungs: clear Abdomen: soft, nontender, nondistended. No hepatosplenomegaly. No bruits or masses. Good bowel sounds. Extremities: no cyanosis, clubbing, rash, 1-2 + edema Neuro: alert & orientedx3, cranial nerves grossly intact. moves all 4 extremities w/o difficulty. Affect pleasant   ECG: SR with 1st degree AVB, 68 bpm, poor R wave progression, inferior Qs, no new changes  Device check: No AT/AF, minimally Vpaced   ASSESSMENT & PLAN: 1. Chronic systolic CHF: ICM - Echo 95/32 EF 45-50% G2DD  - Stable NYHA II. Appears mildly volume overloaded. Weight up a few lb. - Continue Torsemide 40 mg daily.  Take an extra 40 mg daily X 2 days with additional K.  - Continue losartan 25 mg daily. - Continue carvedilol 12.5 mg bid. - Continue Jardiance 10 mg daily.  - Arlyce Harman stopped during recent admit d/t low BP. Now stable. Add back at 12.5 mg daily. - Stop Imdur. Not having any angina and not being used in combination with hydralazine.  - Will get copy of labs done at PCP's office earlier this week. Repeat BMET in 1 week   2. CAD: Stenting in 2010, 2012 and 2016.  - No s/s ischemia - Continue statin.  - No ASA with Eliquis.   3. Paroxysmal Afib - SR today - Continue Eliquis 5 mg bid.  - Requesting labs from recent PCP visit   4. HTN - Blood pressure well controlled.  - Meds recently adjusted during admission in November 2022 as above.   5. DM - Per Endocrine. On insulin. - Continue Jardiance.    6. H/o sinus pauses s/p PPM - Dr. Curt Bears now following.  - No change.  7. OSA-  - Continue CPAP nightly.  - Follows with Dr. Radford Pax.   8. CKD 3b - Baseline SCr ~ 1.4 - Continue Jardiance - BMET today.   9. Right knee PJI  - SP I&D with poly exchange on 11/18 , Cx+ MSSA - Completed 6 weeks of antibiotics, followed by ID. - Close Ortho follow-up - On  suppressive keflex   10. Anemia: -Hgb down to 7 post op during recent admit (no transfusion, Jehovah's witness) -Given feraheme for iron deficiency -Obtaining record of recent labs  Will arrange referral to enroll in Paramedicine program. She is having trouble managing medications on her own. Pill boxes recently corrected by EMT. Discussed today's medication changes with [name], EMT over the phone.    Follow-up: 3 months with Dr. Haroldine Laws.

## 2021-06-14 NOTE — Progress Notes (Incomplete)
Advanced Heart Failure Clinic Note    Primary Care: Seward Carol, MD EP: Dr Tomasa Blase Rheumatology: Dr Posey Rea HF Cardiologist: Dr. Haroldine Laws   HPI:  Danielle [name] is a 76 y.o. female with a past medical history of systolicHF (EF 95-18%), CAD s/p multiple stents in [place] (with h/o LAD stent thrombosis), ischemic cardiomyopathy (EF 10% at one point), symptomatic bradycardia s/p PPM, HTN, DM, hypothyroidism, and OSA.  Admitted 9/18 with SOB, acute on chronic systolic CHF. Diuresed with IV lasix (12 pounds), discharge weight was 215 pounds.   Had R TKR 12/19.   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  She required I&D and poly-exchange of infected right total knee arthroplasty.  Spironolactone was held, and losartan and carvedilol were both reduced due to low blood pressures.  She was given 1 dose of furosemide on admission due to leg edema but this was held later due to AKI.  Torsemide was resumed at discharge.  Last seen 5/22. Continues to follow with Paramedicine. Danielle [name] is here with her today. Feels good. Just got back from 1 month in [place]. Was very active. Denies SOB, orthopnea or PND. Mild LE edema. Taking all meds as prescribed. No dizziness. Now back home from SNF, paramedicine helping.   Echo 12/21 EF 45-50% G2DD  ECHO 01/2017- EF 45-50% Grade 2DD Left Atrium Severely dilated.  ECHO 12/2017 EF 45-50%, G2DD, LA mildly dilated, trileaflet moderately thick aortic valve  Review of systems complete and found to be negative unless listed in HPI.    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[employer]) 02/05/2017   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 05/20/2018   Acute kidney failure ([place]) 05/15/2018   Arthritis    "all over" (05/22/2018)   Blood dyscrasia    per pt-has small blood cells-appears as if anemic   Breast cancer, left breast ([place]) 1985   CHF  (congestive heart failure) (HCC)    Complication of anesthesia    difficult to awaken from per pt   Coronary artery disease    Dyspnea    Essential hypertension    Heart disease    Heart murmur    History of gout    Hypothyroidism    Obesity (BMI 30-39.9) 11/11/2017   OSA (obstructive sleep apnea) 02/18/2017    severe obstructive sleep apnea with an AHI of 75.6/h and mild central sleep apnea with a CAI of 7.7/h.  Oxygen saturations dropped as low as 82%.   He is on CPAP at 9 cm H2O.   OSA on CPAP    Personal history of chemotherapy    Personal history of radiation therapy    Presence of permanent cardiac pacemaker    Primary localized osteoarthritis of right knee 05/13/2018   Refusal of blood transfusions as patient is Jehovah's Witness    Type II diabetes mellitus ([place])     Current Outpatient Medications  Medication Sig Dispense Refill   acetaminophen (TYLENOL) 500 MG tablet Take 500-1,000 mg by mouth every 6 (six) hours as needed (for pain or headaches).     Alcohol Swabs PADS See admin instructions.     allopurinol (ZYLOPRIM) 100 MG tablet Take 100 mg by mouth daily.     apixaban (ELIQUIS) 5 MG TABS tablet Take 1 tablet (5 mg total) by mouth 2 (two) times daily. 60 tablet 11   atorvastatin (LIPITOR) 80 MG tablet Take 1 tablet (80 mg  total) by mouth at bedtime. 90 tablet 3   Biotin 10 MG TABS Take 10 mg by mouth daily.     Blood Glucose Monitoring Suppl (ONETOUCH VERIO) w/Device KIT Use to check blood sugar 1 kit 0   carvedilol (COREG) 12.5 MG tablet Take 1 tablet (12.5 mg total) by mouth 2 (two) times daily with a meal. 60 tablet 4   cephALEXin (KEFLEX) 500 MG capsule Take 1 capsule (500 mg total) by mouth 2 (two) times daily. 60 capsule 5   clotrimazole-betamethasone (LOTRISONE) lotion Apply 1 application topically 2 (two) times daily as needed (itching).     Continuous Blood Gluc Sensor (DEXCOM G6 SENSOR) MISC 1 Device by Does not apply route as directed. 9 each 3   Continuous  Blood Gluc Transmit (DEXCOM G6 TRANSMITTER) MISC 1 Device by Does not apply route as directed. 1 each 3   empagliflozin (JARDIANCE) 10 MG TABS tablet Take 10 mg by mouth daily.     Ferrous Sulfate (IRON) 325 (65 Fe) MG TABS Take 1 tablet (325 mg total) by mouth daily. 30 tablet 0   gabapentin (NEURONTIN) 300 MG capsule Take 300 mg by mouth 2 (two) times daily.     insulin glargine-yfgn (SEMGLEE) 100 UNIT/ML injection Inject 0.3 mLs (30 Units total) into the skin daily. 10 mL 0   insulin lispro (HUMALOG) 100 UNIT/ML injection Inject 0.05 mLs (5 Units total) into the skin 3 (three) times daily before meals. 10 mL 0   isosorbide mononitrate (IMDUR) 30 MG 24 hr tablet TAKE 1 TABLET BY MOUTH ONCE DAILY (Patient taking differently: 30 mg daily.) 30 tablet 11   latanoprost (XALATAN) 0.005 % ophthalmic solution Place 1 drop into both eyes at bedtime.     levothyroxine (SYNTHROID, LEVOTHROID) 75 MCG tablet Take 75 mcg by mouth daily before breakfast.     losartan (COZAAR) 25 MG tablet Take 1 tablet (25 mg total) by mouth daily. 30 tablet 6   LUMIGAN 0.01 % SOLN Place 1 drop into both eyes at bedtime.     Multiple Vitamins-Minerals (ALIVE ONCE DAILY WOMENS 50+ PO) Take 1 tablet by mouth daily.     mupirocin ointment (BACTROBAN) 2 % Apply to left great toe once daily for 14 days. (Patient taking differently: Apply 1 application topically daily. Left big toe) 30 g 1   polyethylene glycol (MIRALAX / GLYCOLAX) 17 g packet Take 17 g by mouth daily. (Patient not taking: Reported on 06/12/2021) 14 each 0   potassium chloride (KLOR-CON M) 10 MEQ tablet Take 2 tablets (20 mEq total) by mouth daily. 60 tablet 11   RELION PEN NEEDLE 31G/8MM 31G X 8 MM MISC Inject 31 g as directed in the morning, at noon, and at bedtime.     torsemide (DEMADEX) 20 MG tablet Take 2 tablets (40 mg total) by mouth daily. 60 tablet 11   Travoprost, BAK Free, (TRAVATAN) 0.004 % SOLN ophthalmic solution Place 1 drop into both eyes at bedtime.       No current facility-administered medications for this visit.   Allergies  Allergen Reactions   Other Other (See Comments)    Blood Product Refusal (Jehovah's witness)     Sulfa Antibiotics Rash   Uloric [Febuxostat] Rash   Social History   Socioeconomic History   Marital status: Widowed    Spouse name: Not on file   Number of children: 3   Years of education: Not on file   Highest education level: Some college, no degree  Occupational  History   Occupation: retired  Tobacco Use   Smoking status: Former    Packs/day: 1.00    Years: 22.00    Pack years: 22.00    Types: Cigarettes    Quit date: 1983    Years since quitting: 40.0   Smokeless tobacco: Never  Vaping Use   Vaping Use: Never used  Substance and Sexual Activity   Alcohol use: Yes    Comment: 05/22/2018 "glass of wine couple times/wk"   Drug use: Not Currently   Sexual activity: Not Currently    Birth control/protection: None, Post-menopausal  Other Topics Concern   Not on file  Social History Narrative   Not on file   Social Determinants of Health   Financial Resource Strain: Not on file  Food Insecurity: Not on file  Transportation Needs: Not on file  Physical Activity: Not on file  Stress: Not on file  Social Connections: Not on file  Intimate Partner Violence: Not on file   Family History  Problem Relation Age of Onset   Multiple myeloma Mother    Heart disease Father    Other Sister    Heart disease Brother    Diabetes Sister    Other Sister    There were no vitals filed for this visit.    Wt Readings from Last 3 Encounters:  06/12/21 93.4 kg (206 lb)  04/13/21 101.2 kg (223 lb)  04/10/21 101.2 kg (223 lb)    PHYSICAL EXAM: General:  Well appearing. No resp difficulty HEENT: normal Neck: supple. JVP 10. Carotids 2+ bilat; no bruits. No lymphadenopathy or thryomegaly appreciated. Cor: PMI nondisplaced. Regular rate & rhythm. No rubs, gallops or murmurs. Lungs: clear Abdomen:  obese soft, nontender, nondistended. No hepatosplenomegaly. No bruits or masses. Good bowel sounds. Extremities: no cyanosis, clubbing, rash, 2+  edema Neuro: alert & orientedx3, cranial nerves grossly intact. moves all 4 extremities w/o difficulty. Affect pleasant   ASSESSMENT & PLAN:  1. Chronic systolic CHF: ICM - Echo 32/91 EF 45-50% G2DD  - Stable NYHA II. Volume status mildly elevated  - Continue Torsemide 40 mg daily.  Will double for 2 days with extra K. Need to watch diet more closely  - Continue Imdur 30 mg daily - Continue losartan 50 mg daily. - Continue spiro 25 mg daily.  - Continue carvedilol 25 mg bid. - Continue Jardiance 10 mg daily.  - BMET today  2. CAD: Stenting in 2010, 2012 and 2016.  - No ss/s ischemia - Continue statin. No ASA with Eliquis.  3. Paroxysmal Afib - Regular on exam - Continue Eliquis 5 mg bid. No bleeding  - Following in Afib clinic.   4. HTN - Blood pressure well controlled. Continue current regimen. - If goes back up, can switch losartan to Entresto.   5. DM - Per PCP - Continue Jardiance.   6. H/o sinus pauses s/p PPM - Dr. Curt Bears now following.  - No change.   7. OSA-  - Continue CPAP nightly.  - Follows with D.r Radford Pax.  8. CKD 3b - Baseline SCr ~ 1.4 - Continue Jardiance - BMET today.  9. Right knee PJI  - SP I&D with poly exchange on 11/18 , Cx+ MSSA - Completed 6 weeks of antibiotics, followed ny ID. - Close Ortho follow-up - On suppressive keflex   Follow up with Dr. Melynda Keller, FNP 06/14/21

## 2021-06-15 ENCOUNTER — Other Ambulatory Visit (HOSPITAL_COMMUNITY): Payer: Self-pay

## 2021-06-15 ENCOUNTER — Ambulatory Visit (HOSPITAL_COMMUNITY)
Admission: RE | Admit: 2021-06-15 | Discharge: 2021-06-15 | Disposition: A | Discharge status: Home / Self Care | Payer: Medicare HMO | Source: Ambulatory Visit | Attending: Internal Medicine | Admitting: Internal Medicine

## 2021-06-15 ENCOUNTER — Encounter (HOSPITAL_COMMUNITY): Payer: Self-pay

## 2021-06-15 ENCOUNTER — Other Ambulatory Visit: Payer: Self-pay

## 2021-06-15 VITALS — BP 104/60 | HR 69 | Wt 211.6 lb

## 2021-06-15 DIAGNOSIS — G4733 Obstructive sleep apnea (adult) (pediatric): Secondary | ICD-10-CM | POA: Insufficient documentation

## 2021-06-15 DIAGNOSIS — I10 Essential (primary) hypertension: Secondary | ICD-10-CM

## 2021-06-15 DIAGNOSIS — Z79899 Other long term (current) drug therapy: Secondary | ICD-10-CM | POA: Insufficient documentation

## 2021-06-15 DIAGNOSIS — I130 Hypertensive heart and chronic kidney disease with heart failure and stage 1 through stage 4 chronic kidney disease, or unspecified chronic kidney disease: Secondary | ICD-10-CM | POA: Insufficient documentation

## 2021-06-15 DIAGNOSIS — Z7984 Long term (current) use of oral hypoglycemic drugs: Secondary | ICD-10-CM | POA: Insufficient documentation

## 2021-06-15 DIAGNOSIS — E1122 Type 2 diabetes mellitus with diabetic chronic kidney disease: Secondary | ICD-10-CM | POA: Diagnosis not present

## 2021-06-15 DIAGNOSIS — I5022 Chronic systolic (congestive) heart failure: Secondary | ICD-10-CM | POA: Insufficient documentation

## 2021-06-15 DIAGNOSIS — I2510 Atherosclerotic heart disease of native coronary artery without angina pectoris: Secondary | ICD-10-CM | POA: Diagnosis not present

## 2021-06-15 DIAGNOSIS — I480 Paroxysmal atrial fibrillation: Secondary | ICD-10-CM | POA: Insufficient documentation

## 2021-06-15 DIAGNOSIS — D649 Anemia, unspecified: Secondary | ICD-10-CM | POA: Insufficient documentation

## 2021-06-15 DIAGNOSIS — Z950 Presence of cardiac pacemaker: Secondary | ICD-10-CM | POA: Diagnosis not present

## 2021-06-15 DIAGNOSIS — Z7901 Long term (current) use of anticoagulants: Secondary | ICD-10-CM | POA: Insufficient documentation

## 2021-06-15 DIAGNOSIS — N1832 Chronic kidney disease, stage 3b: Secondary | ICD-10-CM | POA: Diagnosis not present

## 2021-06-15 LAB — CUP PACEART REMOTE DEVICE CHECK
Battery Remaining Longevity: 53 mo
Battery Voltage: 2.98 V
Brady Statistic AP VP Percent: 0.01 %
Brady Statistic AP VS Percent: 8.82 %
Brady Statistic AS VP Percent: 0.04 %
Brady Statistic AS VS Percent: 91.12 %
Brady Statistic RA Percent Paced: 8.82 %
Brady Statistic RV Percent Paced: 0.05 %
Date Time Interrogation Session: 20230119115119
Implantable Lead Implant Date: 20160222
Implantable Lead Implant Date: 20160222
Implantable Lead Location: 753859
Implantable Lead Location: 753860
Implantable Lead Model: 5076
Implantable Lead Model: 5076
Implantable Pulse Generator Implant Date: 20160222
Lead Channel Impedance Value: 418 Ohm
Lead Channel Impedance Value: 418 Ohm
Lead Channel Impedance Value: 437 Ohm
Lead Channel Impedance Value: 513 Ohm
Lead Channel Pacing Threshold Amplitude: 0.75 V
Lead Channel Pacing Threshold Amplitude: 0.875 V
Lead Channel Pacing Threshold Pulse Width: 0.4 ms
Lead Channel Pacing Threshold Pulse Width: 0.4 ms
Lead Channel Sensing Intrinsic Amplitude: 15.5 mV
Lead Channel Sensing Intrinsic Amplitude: 15.5 mV
Lead Channel Sensing Intrinsic Amplitude: 4.625 mV
Lead Channel Sensing Intrinsic Amplitude: 4.625 mV
Lead Channel Setting Pacing Amplitude: 2 V
Lead Channel Setting Pacing Amplitude: 2 V
Lead Channel Setting Pacing Pulse Width: 0.4 ms
Lead Channel Setting Sensing Sensitivity: 2 mV

## 2021-06-15 MED ORDER — SPIRONOLACTONE 25 MG PO TABS: 12.5000 mg | ORAL_TABLET | Freq: Every day | ORAL | 3 refills | Status: DC

## 2021-06-15 NOTE — Progress Notes (Signed)
Paramedicine Encounter    Patient ID: Danielle [name], female    DOB: September 09, 1945, 76 y.o.   MRN: [id number]  Martin Majestic out to make med changes for Mrs. Altic in her pill box from visit in clinic today.   Remove IMDUR  Add Torsemide - Continue Torsemide Take an extra 40 mg daily X 2 days with additional K.    Add back Spironolactone 12.5mg  (None at home,waiting on fill from [place]. Will start back once we receive it)   I will follow up in the home on Monday.    ACTION: Home visit completed

## 2021-06-15 NOTE — Progress Notes (Signed)
Pt being referred back to paramedicine program at this time to assist with medication management  Referral sent out to paramedics for assignment  Danielle [name], [facility] Desk#: [phone] Cell#: [phone]

## 2021-06-15 NOTE — Addendum Note (Signed)
Encounter addended by: Jorge Ny, LCSW on: 06/15/2021 2:54 PM  Actions taken: Flowsheet accepted, Clinical Note Signed

## 2021-06-15 NOTE — Patient Instructions (Addendum)
INCREASE Torsemide to 40 mg twice a day for 2 days, then resume normal dose of 40 mg daily there after  INCREASE Potassium to 20 meq twice a day for 2 days, then resume 20 meq daily there after  STOP Imdur (Isosorbide)  START Spironolactone 12.5 mg one half tab daily  Labs needed in one week  Your physician recommends that you schedule a follow-up appointment in: 3 months with Dr Lorrin Mais  Do the following things EVERYDAY: Weigh yourself in the morning before breakfast. Write it down and keep it in a log. Take your medicines as prescribed Eat low salt foods--Limit salt (sodium) to 2000 mg per day.  Stay as active as you can everyday Limit all fluids for the day to less than 2 liters  At the [facility], you and your health needs are our priority. As part of our continuing mission to provide you with exceptional heart care, we have created designated Provider Care Teams. These Care Teams include your primary Cardiologist (physician) and Advanced Practice Providers (APPs- Physician Assistants and Nurse Practitioners) who all work together to provide you with the care you need, when you need it.   You may see any of the following providers on your designated Care Team at your next follow up: Dr Glori Bickers Dr Haynes Kerns, NP Lyda Jester, [place] [facility], [place] Audry Riles, PharmD   Please be sure to bring in all your medications bottles to every appointment.

## 2021-06-16 DIAGNOSIS — M1711 Unilateral primary osteoarthritis, right knee: Secondary | ICD-10-CM | POA: Diagnosis not present

## 2021-06-16 DIAGNOSIS — M00061 Staphylococcal arthritis, right knee: Secondary | ICD-10-CM | POA: Diagnosis not present

## 2021-06-16 DIAGNOSIS — I130 Hypertensive heart and chronic kidney disease with heart failure and stage 1 through stage 4 chronic kidney disease, or unspecified chronic kidney disease: Secondary | ICD-10-CM | POA: Diagnosis not present

## 2021-06-16 DIAGNOSIS — H3563 Retinal hemorrhage, bilateral: Secondary | ICD-10-CM | POA: Diagnosis not present

## 2021-06-16 DIAGNOSIS — G9340 Encephalopathy, unspecified: Secondary | ICD-10-CM | POA: Diagnosis not present

## 2021-06-16 DIAGNOSIS — I5043 Acute on chronic combined systolic (congestive) and diastolic (congestive) heart failure: Secondary | ICD-10-CM | POA: Diagnosis not present

## 2021-06-16 DIAGNOSIS — E1142 Type 2 diabetes mellitus with diabetic polyneuropathy: Secondary | ICD-10-CM | POA: Diagnosis not present

## 2021-06-16 DIAGNOSIS — B9561 Methicillin susceptible Staphylococcus aureus infection as the cause of diseases classified elsewhere: Secondary | ICD-10-CM | POA: Diagnosis not present

## 2021-06-16 DIAGNOSIS — E1165 Type 2 diabetes mellitus with hyperglycemia: Secondary | ICD-10-CM | POA: Diagnosis not present

## 2021-06-16 DIAGNOSIS — E113413 Type 2 diabetes mellitus with severe nonproliferative diabetic retinopathy with macular edema, bilateral: Secondary | ICD-10-CM | POA: Diagnosis not present

## 2021-06-19 ENCOUNTER — Other Ambulatory Visit (HOSPITAL_COMMUNITY): Payer: Self-pay

## 2021-06-19 NOTE — Progress Notes (Signed)
Paramedicine Encounter    Patient ID: Danielle [name], female    DOB: 1945-07-17, 76 y.o.   MRN: [id number]  Arrived for home visit for Fraser who reports she is feeling good other than just feeling tired today.   Assessment and vitals obtained. She had not yet taken her insulin today and her CBG was 361. She reports eating her lunch around 12:00 and had some watermelon. I assisted her in reviewing her insulin doses and she took her 5 units of Humalog.   Weight is up two lbs from last week, she reports overeating over the weekend. Some lower leg edema noted. No shortness of breath, chest pain or dizziness reported.   She was compliant with med changes over the weekend that were placed in her pill box.   We discussed diet and diabetic management. Her dexcom transmitter should be getting delivered today.Once it does I will come out and assist.   Medications were reviewed and confirmed. Pill box filled accordingly.   We reviewed upcoming appointments as well and wrote down same.   Home visit complete.   Refills: Carvedilol Levothyroxine Spironolactone   I will see Faviola in one week.     Patient Care Team: Seward Carol, MD as PCP - General (Internal Medicine) Constance Haw, MD as PCP - Electrophysiology (Cardiology) Sueanne Margarita, MD as PCP - Sleep Medicine (Cardiology) Bensimhon, Shaune Pascal, MD as PCP - Cardiology (Cardiology) Jorge Ny, LCSW as Social Worker (Licensed Clinical Social Worker) Shamleffer, Melanie Crazier, MD as Consulting Physician (Endocrinology)  Patient Active Problem List   Diagnosis Date Noted   CAD (coronary artery disease) 06/08/2021   Carotid artery disease ([place]) 06/08/2021   Symptomatic bradycardia 06/08/2021   Jehovah's Witness - Pt cannot accept blood transfusions 06/08/2021   Paroxysmal atrial fibrillation ([place]) 06/08/2021   CKD (chronic kidney disease), stage III ([place]) 06/08/2021   Septic arthritis ([place]) 04/13/2021   Polyneuropathy  associated with underlying disease ([place]) 05/04/2020   Severe nonproliferative diabetic retinopathy of left eye, with macular edema, associated with type 2 diabetes mellitus ([place]) 11/05/2019   Severe nonproliferative diabetic retinopathy of right eye, with macular edema, associated with type 2 diabetes mellitus ([place]) 11/05/2019   Retinal hemorrhage of right eye 11/05/2019   Retinal hemorrhage of left eye 11/05/2019   Diabetes mellitus ([place]) 05/14/2019   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, with long-term current use of insulin ([place]) 05/14/2019   Type 2 diabetes mellitus with diabetic polyneuropathy, with long-term current use of insulin ([place]) 05/14/2019   History of gout 05/20/2018   Primary localized osteoarthritis of right knee 05/13/2018   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) 12/20/2017   Obesity (BMI 30-39.9) 11/11/2017   OSA (obstructive sleep apnea) 02/18/2017   Essential hypertension    L Breast Cancer    Hypothyroidism     Current Outpatient Medications:    acetaminophen (TYLENOL) 500 MG tablet, Take 500-1,000 mg by mouth every 6 (six) hours as needed (for pain or headaches)., Disp: , Rfl:    Alcohol Swabs PADS, See admin instructions., Disp: , Rfl:    allopurinol (ZYLOPRIM) 100 MG tablet, Take 100 mg by mouth daily., Disp: , Rfl:    apixaban (ELIQUIS) 5 MG TABS tablet, Take 1 tablet (5 mg total) by mouth 2 (two) times daily., Disp: 60 tablet, Rfl: 11   atorvastatin (LIPITOR) 80 MG tablet, Take 1 tablet (80 mg total) by mouth at bedtime., Disp: 90 tablet, Rfl: 3   Biotin 10 MG  TABS, Take 10 mg by mouth daily., Disp: , Rfl:    Blood Glucose Monitoring Suppl (ONETOUCH VERIO) w/Device KIT, Use to check blood sugar, Disp: 1 kit, Rfl: 0   carvedilol (COREG) 12.5 MG tablet, Take 1 tablet (12.5 mg total) by mouth 2 (two) times daily with a meal., Disp: 60 tablet, Rfl: 4   cephALEXin (KEFLEX) 500 MG capsule, Take 1 capsule (500 mg total) by mouth 2 (two) times  daily., Disp: 60 capsule, Rfl: 5   clotrimazole-betamethasone (LOTRISONE) lotion, Apply 1 application topically 2 (two) times daily as needed (itching)., Disp: , Rfl:    Continuous Blood Gluc Sensor (DEXCOM G6 SENSOR) MISC, 1 Device by Does not apply route as directed., Disp: 9 each, Rfl: 3   Continuous Blood Gluc Transmit (DEXCOM G6 TRANSMITTER) MISC, 1 Device by Does not apply route as directed., Disp: 1 each, Rfl: 3   empagliflozin (JARDIANCE) 10 MG TABS tablet, Take 10 mg by mouth daily., Disp: , Rfl:    Ferrous Sulfate (IRON) 325 (65 Fe) MG TABS, Take 1 tablet (325 mg total) by mouth daily., Disp: 30 tablet, Rfl: 0   gabapentin (NEURONTIN) 300 MG capsule, Take 300 mg by mouth 2 (two) times daily., Disp: , Rfl:    insulin glargine-yfgn (SEMGLEE) 100 UNIT/ML injection, Inject 0.3 mLs (30 Units total) into the skin daily., Disp: 10 mL, Rfl: 0   insulin lispro (HUMALOG) 100 UNIT/ML injection, Inject 0.05 mLs (5 Units total) into the skin 3 (three) times daily before meals., Disp: 10 mL, Rfl: 0   latanoprost (XALATAN) 0.005 % ophthalmic solution, Place 1 drop into both eyes at bedtime., Disp: , Rfl:    levothyroxine (SYNTHROID, LEVOTHROID) 75 MCG tablet, Take 75 mcg by mouth daily before breakfast., Disp: , Rfl:    losartan (COZAAR) 25 MG tablet, Take 1 tablet (25 mg total) by mouth daily., Disp: 30 tablet, Rfl: 6   LUMIGAN 0.01 % SOLN, Place 1 drop into both eyes at bedtime., Disp: , Rfl:    Multiple Vitamins-Minerals (ALIVE ONCE DAILY WOMENS 50+ PO), Take 1 tablet by mouth daily., Disp: , Rfl:    polyethylene glycol (MIRALAX / GLYCOLAX) 17 g packet, Take 17 g by mouth daily., Disp: 14 each, Rfl: 0   potassium chloride (KLOR-CON M) 10 MEQ tablet, Take 2 tablets (20 mEq total) by mouth daily., Disp: 60 tablet, Rfl: 11   RELION PEN NEEDLE 31G/8MM 31G X 8 MM MISC, Inject 31 g as directed in the morning, at noon, and at bedtime., Disp: , Rfl:    spironolactone (ALDACTONE) 25 MG tablet, Take 0.5 tablets  (12.5 mg total) by mouth daily., Disp: 45 tablet, Rfl: 3   torsemide (DEMADEX) 20 MG tablet, Take 2 tablets (40 mg total) by mouth daily., Disp: 60 tablet, Rfl: 11   Travoprost, BAK Free, (TRAVATAN) 0.004 % SOLN ophthalmic solution, Place 1 drop into both eyes at bedtime. , Disp: , Rfl:  Allergies  Allergen Reactions   Other Other (See Comments)    Blood Product Refusal (Jehovah's witness)     Sulfa Antibiotics Rash   Uloric [Febuxostat] Rash     Social History   Socioeconomic History   Marital status: Widowed    Spouse name: Not on file   Number of children: 3   Years of education: Not on file   Highest education level: Some college, no degree  Occupational History   Occupation: retired  Tobacco Use   Smoking status: Former    Packs/day: 1.00  Years: 22.00    Pack years: 22.00    Types: Cigarettes    Quit date: 22    Years since quitting: 40.0   Smokeless tobacco: Never  Vaping Use   Vaping Use: Never used  Substance and Sexual Activity   Alcohol use: Yes    Comment: 05/22/2018 "glass of wine couple times/wk"   Drug use: Not Currently   Sexual activity: Not Currently    Birth control/protection: None, Post-menopausal  Other Topics Concern   Not on file  Social History Narrative   Not on file   Social Determinants of Health   Financial Resource Strain: Not on file  Food Insecurity: Not on file  Transportation Needs: Not on file  Physical Activity: Not on file  Stress: Not on file  Social Connections: Not on file  Intimate Partner Violence: Not on file    Physical Exam Vitals reviewed.  Constitutional:      Appearance: Normal appearance. She is normal weight.  HENT:     Head: Normocephalic.     Right Ear: Tympanic membrane normal.     Nose: Nose normal.     Mouth/Throat:     Mouth: Mucous membranes are moist.     Pharynx: Oropharynx is clear.  Eyes:     Conjunctiva/sclera: Conjunctivae normal.     Pupils: Pupils are equal, round, and reactive  to light.  Cardiovascular:     Rate and Rhythm: Normal rate and regular rhythm.     Pulses: Normal pulses.     Heart sounds: Normal heart sounds.  Pulmonary:     Effort: Pulmonary effort is normal.     Breath sounds: Normal breath sounds.  Abdominal:     General: Abdomen is flat.     Palpations: Abdomen is soft.  Musculoskeletal:        General: Swelling present. Normal range of motion.     Cervical back: Normal range of motion.     Right lower leg: Edema present.     Left lower leg: Edema present.  Skin:    General: Skin is warm and dry.     Capillary Refill: Capillary refill takes less than 2 seconds.  Neurological:     General: No focal deficit present.     Mental Status: She is alert. Mental status is at baseline.  Psychiatric:        Mood and Affect: Mood normal.        Future Appointments  Date Time Provider [place]  06/22/2021  2:00 PM [facility] LAB [facility] None  06/29/2021  9:30 AM Shamleffer, Melanie Crazier, MD [facility] None  07/25/2021 10:30 AM Laurice Record, MD [facility]  09/14/2021 11:00 AM [facility] DEVICE REMOTES [facility]  09/20/2021 10:40 AM Bensimhon, Shaune Pascal, MD [facility] None  12/14/2021 11:00 AM [facility] DEVICE REMOTES [facility]  03/15/2022 11:00 AM [facility] DEVICE REMOTES [facility]  06/14/2022 11:00 AM [facility] DEVICE REMOTES [facility]     ACTION: Home visit completed

## 2021-06-21 DIAGNOSIS — M1711 Unilateral primary osteoarthritis, right knee: Secondary | ICD-10-CM | POA: Diagnosis not present

## 2021-06-21 DIAGNOSIS — G9340 Encephalopathy, unspecified: Secondary | ICD-10-CM | POA: Diagnosis not present

## 2021-06-21 DIAGNOSIS — M00061 Staphylococcal arthritis, right knee: Secondary | ICD-10-CM | POA: Diagnosis not present

## 2021-06-21 DIAGNOSIS — E1142 Type 2 diabetes mellitus with diabetic polyneuropathy: Secondary | ICD-10-CM | POA: Diagnosis not present

## 2021-06-21 DIAGNOSIS — B9561 Methicillin susceptible Staphylococcus aureus infection as the cause of diseases classified elsewhere: Secondary | ICD-10-CM | POA: Diagnosis not present

## 2021-06-21 DIAGNOSIS — I130 Hypertensive heart and chronic kidney disease with heart failure and stage 1 through stage 4 chronic kidney disease, or unspecified chronic kidney disease: Secondary | ICD-10-CM | POA: Diagnosis not present

## 2021-06-21 DIAGNOSIS — E1165 Type 2 diabetes mellitus with hyperglycemia: Secondary | ICD-10-CM | POA: Diagnosis not present

## 2021-06-21 DIAGNOSIS — I5043 Acute on chronic combined systolic (congestive) and diastolic (congestive) heart failure: Secondary | ICD-10-CM | POA: Diagnosis not present

## 2021-06-21 DIAGNOSIS — E113413 Type 2 diabetes mellitus with severe nonproliferative diabetic retinopathy with macular edema, bilateral: Secondary | ICD-10-CM | POA: Diagnosis not present

## 2021-06-21 DIAGNOSIS — H3563 Retinal hemorrhage, bilateral: Secondary | ICD-10-CM | POA: Diagnosis not present

## 2021-06-22 ENCOUNTER — Ambulatory Visit (HOSPITAL_COMMUNITY)
Admission: RE | Admit: 2021-06-22 | Discharge: 2021-06-22 | Disposition: A | Discharge status: Home / Self Care | Payer: Medicare HMO | Source: Ambulatory Visit | Attending: Internal Medicine | Admitting: Internal Medicine

## 2021-06-22 ENCOUNTER — Other Ambulatory Visit: Payer: Self-pay

## 2021-06-22 DIAGNOSIS — I5022 Chronic systolic (congestive) heart failure: Secondary | ICD-10-CM | POA: Insufficient documentation

## 2021-06-22 LAB — BASIC METABOLIC PANEL
Anion gap: 8 (ref 5–15)
BUN: 35 mg/dL — ABNORMAL HIGH (ref 8–23)
CO2: 31 mmol/L (ref 22–32)
Calcium: 9 mg/dL (ref 8.9–10.3)
Chloride: 97 mmol/L — ABNORMAL LOW (ref 98–111)
Creatinine, Ser: 1.49 mg/dL — ABNORMAL HIGH (ref 0.44–1.00)
GFR, Estimated: 37 mL/min — ABNORMAL LOW (ref >60)
Glucose, Bld: 227 mg/dL — ABNORMAL HIGH (ref 70–99)
Potassium: 4.2 mmol/L (ref 3.5–5.1)
Sodium: 136 mmol/L (ref 135–145)

## 2021-06-23 ENCOUNTER — Telehealth (HOSPITAL_COMMUNITY): Payer: Self-pay

## 2021-06-23 NOTE — Telephone Encounter (Addendum)
Pt aware, agreeable, and verbalized understanding   ----- Message from Joette Catching, PA-C sent at 06/22/2021  5:21 PM EST ----- Scr up slightly after recently increasing diuretics. Should be back to her usual dose of Torsemide now, only increased for 2 days.   Recheck BMET in 3-4 weeks

## 2021-06-26 ENCOUNTER — Other Ambulatory Visit (HOSPITAL_COMMUNITY): Payer: Self-pay

## 2021-06-26 DIAGNOSIS — L609 Nail disorder, unspecified: Secondary | ICD-10-CM | POA: Diagnosis not present

## 2021-06-26 DIAGNOSIS — E1122 Type 2 diabetes mellitus with diabetic chronic kidney disease: Secondary | ICD-10-CM | POA: Diagnosis not present

## 2021-06-26 DIAGNOSIS — Z7984 Long term (current) use of oral hypoglycemic drugs: Secondary | ICD-10-CM | POA: Diagnosis not present

## 2021-06-26 NOTE — Progress Notes (Signed)
Paramedicine Encounter    Patient ID: Danielle [name], female    DOB: 06/29/1945, 76 y.o.   MRN: [id number]   Danielle [name] was seen by PCP today Dr. Delfina Redwood. Vitals were as follows at her appointment:  Wt- 212lbs BP- 122/60 HR- 76  CBG- 286 (after a meal)  I reviewed meds and filled pill box today for one week.   Refills: Torsemide Potassium   Appointments reviewed.  I plan to see Danielle [name] in one week.      ACTION: Home visit completed

## 2021-06-27 DIAGNOSIS — M00061 Staphylococcal arthritis, right knee: Secondary | ICD-10-CM | POA: Diagnosis not present

## 2021-06-27 DIAGNOSIS — E1142 Type 2 diabetes mellitus with diabetic polyneuropathy: Secondary | ICD-10-CM | POA: Diagnosis not present

## 2021-06-27 DIAGNOSIS — E1165 Type 2 diabetes mellitus with hyperglycemia: Secondary | ICD-10-CM | POA: Diagnosis not present

## 2021-06-27 DIAGNOSIS — H3563 Retinal hemorrhage, bilateral: Secondary | ICD-10-CM | POA: Diagnosis not present

## 2021-06-27 DIAGNOSIS — I5043 Acute on chronic combined systolic (congestive) and diastolic (congestive) heart failure: Secondary | ICD-10-CM | POA: Diagnosis not present

## 2021-06-27 DIAGNOSIS — I130 Hypertensive heart and chronic kidney disease with heart failure and stage 1 through stage 4 chronic kidney disease, or unspecified chronic kidney disease: Secondary | ICD-10-CM | POA: Diagnosis not present

## 2021-06-27 DIAGNOSIS — B9561 Methicillin susceptible Staphylococcus aureus infection as the cause of diseases classified elsewhere: Secondary | ICD-10-CM | POA: Diagnosis not present

## 2021-06-27 DIAGNOSIS — M1711 Unilateral primary osteoarthritis, right knee: Secondary | ICD-10-CM | POA: Diagnosis not present

## 2021-06-27 DIAGNOSIS — E113413 Type 2 diabetes mellitus with severe nonproliferative diabetic retinopathy with macular edema, bilateral: Secondary | ICD-10-CM | POA: Diagnosis not present

## 2021-06-27 DIAGNOSIS — G9340 Encephalopathy, unspecified: Secondary | ICD-10-CM | POA: Diagnosis not present

## 2021-06-29 ENCOUNTER — Ambulatory Visit: Payer: Medicare HMO | Admitting: Internal Medicine

## 2021-06-29 DIAGNOSIS — H3563 Retinal hemorrhage, bilateral: Secondary | ICD-10-CM | POA: Diagnosis not present

## 2021-06-29 DIAGNOSIS — E1142 Type 2 diabetes mellitus with diabetic polyneuropathy: Secondary | ICD-10-CM | POA: Diagnosis not present

## 2021-06-29 DIAGNOSIS — I130 Hypertensive heart and chronic kidney disease with heart failure and stage 1 through stage 4 chronic kidney disease, or unspecified chronic kidney disease: Secondary | ICD-10-CM | POA: Diagnosis not present

## 2021-06-29 DIAGNOSIS — I5043 Acute on chronic combined systolic (congestive) and diastolic (congestive) heart failure: Secondary | ICD-10-CM | POA: Diagnosis not present

## 2021-06-29 DIAGNOSIS — G9340 Encephalopathy, unspecified: Secondary | ICD-10-CM | POA: Diagnosis not present

## 2021-06-29 DIAGNOSIS — M00061 Staphylococcal arthritis, right knee: Secondary | ICD-10-CM | POA: Diagnosis not present

## 2021-06-29 DIAGNOSIS — M1711 Unilateral primary osteoarthritis, right knee: Secondary | ICD-10-CM | POA: Diagnosis not present

## 2021-06-29 DIAGNOSIS — B9561 Methicillin susceptible Staphylococcus aureus infection as the cause of diseases classified elsewhere: Secondary | ICD-10-CM | POA: Diagnosis not present

## 2021-06-29 DIAGNOSIS — E1165 Type 2 diabetes mellitus with hyperglycemia: Secondary | ICD-10-CM | POA: Diagnosis not present

## 2021-06-29 DIAGNOSIS — E113413 Type 2 diabetes mellitus with severe nonproliferative diabetic retinopathy with macular edema, bilateral: Secondary | ICD-10-CM | POA: Diagnosis not present

## 2021-06-29 NOTE — Progress Notes (Deleted)
Name: Danielle [name]  Age/ Sex: 76 y.o., female   MRN/ DOB: [id number], 09-09-1945     PCP: Seward Carol, MD   Reason for Endocrinology Evaluation: Type 2 Diabetes Mellitus  Initial Endocrine Consultative Visit: 05/14/2019    PATIENT IDENTIFIER: Danielle [name] is a 76 y.o. female with a past medical history of T2DM, HTN, Dyslipidemia, CAD, Hx of Breast Ca (S/P chemo and lumpectomy)  . The patient has followed with Endocrinology clinic since 05/14/2019 for consultative assistance with management of her diabetes.  DIABETIC HISTORY:  Ms. Risk was diagnosed with T2DM > 20 yrs ago. Has been on  Metformin in the past which caused diarrhea, has been on prandial insulin in the past  . Her hemoglobin A1c has ranged from 6.9% in 2019, peaking at 8.0% in 2020.  On her initial visit to our clinic her A1c was 11.2% . She was on Jardiance and Lantus 130 units daily. I reduced her lantus to 80 units, added prandial insulin.   Jardriance initiated  By cardiology 09/2019  SUBJECTIVE:   During the last visit (01/19/2021): A1c 8.8%. We continued jardiance and adjusted MDI regimen.     Today (06/29/2021): Danielle [name] is here for a follow up on diabetes management.  She  has not had a glucose meter in 2 weeks, just received The patient has had hypoglycemic episodes since the last clinic visit. She is symptomatic with these episodes.   She has issues with ears and sinus issues  Has chronic pain in the neck and shoulder   She has one touch verio    HUmalog 28 units with each meal   HOME DIABETES REGIMEN:  Tresiba 45 units daily - taking 78 units  Jardiance 10 mg daily  CF: Humalog ( BG - 150/20)       Statin: Yes ACE-I/ARB:Yes   METER DOWNLOAD SUMMARY: 8/11/-01/19/2021  Average Number Tests/Day = 0.2 Overall Mean FS Glucose = 182 Standard Deviation = 52  BG Ranges: Low = 129 High = 233  Hypoglycemic Events/30 Days: BG < 50 = 0 Episodes of symptomatic severe hypoglycemia =  0    DIABETIC COMPLICATIONS: Microvascular complications:  Neuropathy, CKD III, 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  (S/P laser)  Last eye exam: Completed 07/06/2020    Macrovascular complications:  CAD ( S/P MI and stent placement) Denies: PVD, CVA     HISTORY:  Past Medical History:  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[place]) 02/05/2017   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 05/20/2018   Acute kidney failure ([place]) 05/15/2018   Arthritis    "all over" (05/22/2018)   Blood dyscrasia    per pt-has small blood cells-appears as if anemic   Breast cancer, left breast ([place]) 1985   CHF (congestive heart failure) (HCC)    Complication of anesthesia    difficult to awaken from per pt   Coronary artery disease    Dyspnea    Essential hypertension    Heart disease    Heart murmur    History of gout    Hypothyroidism    Obesity (BMI 30-39.9) 11/11/2017   OSA (obstructive sleep apnea) 02/18/2017    severe obstructive sleep apnea with an AHI of 75.6/h and mild central sleep apnea with a CAI of 7.7/h.  Oxygen saturations dropped as low as 82%.   He is on CPAP at 9 cm H2O.   OSA on CPAP  Personal history of chemotherapy    Personal history of radiation therapy    Presence of permanent cardiac pacemaker    Primary localized osteoarthritis of right knee 05/13/2018   Refusal of blood transfusions as patient is Jehovah's Witness    Type II diabetes mellitus ([place])    Past Surgical History:  Past Surgical History:  Procedure Laterality Date   APPLICATION OF WOUND VAC Right 04/14/2021   Procedure: APPLICATION OF WOUND VAC;  Surgeon: Willaim Sheng, MD;  Location: [facility];  Service: Orthopedics;  Laterality: Right;   BREAST BIOPSY Left 1985   BREAST LUMPECTOMY Left 1985   BREAST LUMPECTOMY WITH NEEDLE LOCALIZATION AND AXILLARY LYMPH NODE DISSECTION  1985   CATARACT EXTRACTION W/ INTRAOCULAR  LENS  IMPLANT, BILATERAL Bilateral    CORONARY ANGIOPLASTY WITH STENT PLACEMENT  2010, 2012,2017    2 done 2010 and 1 replaced 2012 and 2 replaced in 2017   [facility]     I & D KNEE WITH POLY EXCHANGE Right 04/14/2021   Procedure: IRRIGATION AND DEBRIDEMENT KNEE WITH POLY EXCHANGE;  Surgeon: Willaim Sheng, MD;  Location: [facility];  Service: Orthopedics;  Laterality: Right;   INSERT / REPLACE / REMOVE PACEMAKER  07/19/2014   JOINT REPLACEMENT     REPLACEMENT TOTAL KNEE Left 09/2012   THYROIDECTOMY, PARTIAL  1978   TONSILLECTOMY     TOTAL KNEE ARTHROPLASTY Right 05/13/2018   Procedure: TOTAL KNEE ARTHROPLASTY;  Surgeon: Marchia Bond, MD;  Location: [facility];  Service: Orthopedics;  Laterality: Right;   TUBAL LIGATION     Social History:  reports that she quit smoking about 40 years ago. Her smoking use included cigarettes. She has a 22.00 pack-year smoking history. She has never used smokeless tobacco. She reports current alcohol use. She reports that she does not currently use drugs. Family History:  Family History  Problem Relation Age of Onset   Multiple myeloma Mother    Heart disease Father    Other Sister    Heart disease Brother    Diabetes Sister    Other Sister      HOME MEDICATIONS: Allergies as of 06/29/2021       Reactions   Other Other (See Comments)   Blood Product Refusal (Jehovah's witness)   Sulfa Antibiotics Rash   Uloric [febuxostat] Rash        Medication List        Accurate as of June 29, 2021  7:24 AM. If you have any questions, ask your nurse or doctor.          acetaminophen 500 MG tablet Commonly known as: TYLENOL Take 500-1,000 mg by mouth every 6 (six) hours as needed (for pain or headaches).   Alcohol Swabs Pads See admin instructions.   ALIVE ONCE DAILY WOMENS 50+ PO Take 1 tablet by mouth daily.   allopurinol 100 MG tablet Commonly known as: ZYLOPRIM Take 100 mg by mouth daily.   atorvastatin 80 MG  tablet Commonly known as: LIPITOR Take 1 tablet (80 mg total) by mouth at bedtime.   Biotin 10 MG Tabs Take 10 mg by mouth daily.   carvedilol 12.5 MG tablet Commonly known as: COREG Take 1 tablet (12.5 mg total) by mouth 2 (two) times daily with a meal.   cephALEXin 500 MG capsule Commonly known as: Keflex Take 1 capsule (500 mg total) by mouth 2 (two) times daily.   clotrimazole-betamethasone lotion Commonly known as: LOTRISONE Apply 1 application topically 2 (  two) times daily as needed (itching).   Dexcom G6 Sensor Misc 1 Device by Does not apply route as directed.   Dexcom G6 Transmitter Misc 1 Device by Does not apply route as directed.   Eliquis 5 MG Tabs tablet Generic drug: apixaban Take 1 tablet (5 mg total) by mouth 2 (two) times daily.   empagliflozin 10 MG Tabs tablet Commonly known as: JARDIANCE Take 10 mg by mouth daily.   gabapentin 300 MG capsule Commonly known as: NEURONTIN Take 300 mg by mouth 2 (two) times daily.   insulin glargine-yfgn 100 UNIT/ML injection Commonly known as: SEMGLEE Inject 0.3 mLs (30 Units total) into the skin daily.   insulin lispro 100 UNIT/ML injection Commonly known as: HUMALOG Inject 0.05 mLs (5 Units total) into the skin 3 (three) times daily before meals.   Iron 325 (65 Fe) MG Tabs Take 1 tablet (325 mg total) by mouth daily.   latanoprost 0.005 % ophthalmic solution Commonly known as: XALATAN Place 1 drop into both eyes at bedtime.   levothyroxine 75 MCG tablet Commonly known as: SYNTHROID Take 75 mcg by mouth daily before breakfast.   losartan 25 MG tablet Commonly known as: COZAAR Take 1 tablet (25 mg total) by mouth daily.   Lumigan 0.01 % Soln Generic drug: bimatoprost Place 1 drop into both eyes at bedtime.   OneTouch Verio w/Device Kit Use to check blood sugar   polyethylene glycol 17 g packet Commonly known as: MIRALAX / GLYCOLAX Take 17 g by mouth daily.   potassium chloride 10 MEQ  tablet Commonly known as: KLOR-CON M Take 2 tablets (20 mEq total) by mouth daily.   RELION PEN NEEDLE 31G/8MM 31G X 8 MM Misc Generic drug: Insulin Pen Needle Inject 31 g as directed in the morning, at noon, and at bedtime.   spironolactone 25 MG tablet Commonly known as: ALDACTONE Take 0.5 tablets (12.5 mg total) by mouth daily.   torsemide 20 MG tablet Commonly known as: DEMADEX Take 2 tablets (40 mg total) by mouth daily.   Travoprost (BAK Free) 0.004 % Soln ophthalmic solution Commonly known as: TRAVATAN Place 1 drop into both eyes at bedtime.         OBJECTIVE:   Vital Signs: There were no vitals taken for this visit.  Wt Readings from Last 3 Encounters:  06/19/21 213 lb (96.6 kg)  06/15/21 211 lb 9.6 oz (96 kg)  06/12/21 206 lb (93.4 kg)     EXAM: General: Pt appears well and is in NAD Bilateral cerumen impaction noted  Heart: RRR , + systolic murmur  Lungs: CTA  Extremities:  BL LE: 1+  pretibial edema   Mental Status: Judgment, insight: intact Orientation: oriented to time, place, and person Mood and affect: no depression, anxiety, or agitation    DM Foot Exam 05/04/2020 The skin of the feet is without sores or ulcerations. The pedal pulses unable to palpate due to edema  The sensation is decreased to a screening 5.07, 10 gram monofilament bilaterally at the toes    DATA REVIEWED: Results for CONSTANTINA, LASETER (MRN [id number]) as of 05/04/2020 08:28  Ref. Range 04/25/2020 12:28  Sodium Latest Ref Range: 135 - 145 mmol/L 141  Potassium Latest Ref Range: 3.5 - 5.1 mmol/L 4.2  Chloride Latest Ref Range: 98 - 111 mmol/L 105  CO2 Latest Ref Range: 22 - 32 mmol/L 28  Glucose Latest Ref Range: 70 - 99 mg/dL 118 (H)  Mean Plasma Glucose Latest Units: mg/dL 188.64  BUN Latest Ref Range: 8 - 23 mg/dL 29 (H)  Creatinine Latest Ref Range: 0.44 - 1.00 mg/dL 1.45 (H)  Calcium Latest Ref Range: 8.9 - 10.3 mg/dL 9.5  Anion gap Latest Ref Range: 5 - 15  8  GFR,  Estimated Latest Ref Range: >60 mL/min 38 (L)      Lab Results  Component Value Date   HGBA1C 7.5 (H) 04/13/2021   HGBA1C 8.8 (A) 01/19/2021   HGBA1C 7.1 (A) 07/27/2020   Lab Results  Component Value Date   LDLCALC 65 10/20/2019   CREATININE 1.49 (H) 06/22/2021     Lab Results  Component Value Date   CHOL 150 10/20/2019   HDL 44 10/20/2019   LDLCALC 65 10/20/2019   TRIG 206 (H) 10/20/2019   CHOLHDL 3.4 10/20/2019         ASSESSMENT / PLAN / RECOMMENDATIONS:   1) Type 2 Diabetes Mellitus, poorly with Neuropathic, CKD III and Macrovascular  complications - Most recent A1c of 8.8%. Goal A1c < 7.5 %.    -Unfortunately her glycemic control has worsened, this is due to multiple factors to include stopping exercise due to physical limitations, also her prandial insulin cost has increased and she is currently on patient assistance program, but she was without prandial insulin for almost a month  -Her in office BG was 77 mg/DL this is after taking 28 units of Humalog with breakfast -I am going to reduce her insulin as below -I have attempted to prescribe Dexcom in the past, I explained to the patient that we use third-party pharmacy for this and she was provided with contact information for [facility] and in case it is needed -She will continue to use correction scale as below   MEDICATIONS: Decrease Tresiba to 75 units ONCE daily  She is on Jardiance 10 mg daily per cardiology Decrease Humalog to 25 units with each meal CF: Humalog ( BG - 150/20)   EDUCATION / INSTRUCTIONS: BG monitoring instructions: Patient is instructed to check her blood sugars 4 times a day, before each meal and bedtime. Call [place] [facility] clinic if: BG persistently < 70 I reviewed the Rule of 15 for the treatment of hypoglycemia in detail with the patient. Literature supplied.       F/U in 4 months   Signed electronically by: Mack Guise, MD  [facility] Endocrinology  [facility] [address] [place], [place] [address] Phone: [phone] FAX: [phone]   CC: Seward Carol, [place] [employer] [address] [place] [address] Phone: [phone]  Fax: [phone]  Return to Endocrinology clinic as below: Future Appointments  Date Time Provider [place]  06/29/2021  9:30 AM Treston Coker, Melanie Crazier, MD [facility] None  07/04/2021 11:00 AM [name], MD [facility]  07/17/2021 10:00 AM [facility] LAB [facility] None  07/25/2021 10:30 AM Laurice Record, MD [facility]  09/14/2021 11:00 AM [facility] DEVICE REMOTES [facility]  09/20/2021 10:40 AM Bensimhon, Shaune Pascal, MD [facility] None  12/14/2021 11:00 AM [facility] DEVICE REMOTES [facility]  03/15/2022 11:00 AM [facility] DEVICE REMOTES [facility]  06/14/2022 11:00 AM [facility] DEVICE REMOTES [facility]

## 2021-07-03 ENCOUNTER — Other Ambulatory Visit (HOSPITAL_COMMUNITY): Payer: Self-pay

## 2021-07-03 ENCOUNTER — Telehealth (HOSPITAL_COMMUNITY): Payer: Self-pay

## 2021-07-03 ENCOUNTER — Telehealth: Payer: Self-pay

## 2021-07-03 NOTE — Telephone Encounter (Signed)
[name] states that patient has been doing 40 units of the Bagaslar instead of the [employer]ogg. Medication was changed by the nursing home provider. Patient was doing 15 units of the Humalog instead of the 25 units as advised.  Nurse goes out to assist patient weekly. Nurse was advised on the correct dosing of the medications and will speak patient. Patient sugar have been high several times when nurse went out to visit. Please advise on any changes if needed. Patient has a follow up appt on 07/19/21.

## 2021-07-03 NOTE — Progress Notes (Signed)
Paramedicine Encounter    Patient ID: Danielle [name], female    DOB: [birth year], 76 y.o.   MRN: [id number]   Arrived for home visit for [facility] who reports feeling okay just having some joint aches and pains in her knees and shoulders. She reports this has been ongoing for a few days. No redness or warmth coming from joints. I obtained vitals as noted. Blood sugar is 340. She reports she has been unable to check it before today because her dexcom came off and she didn't know how to put it back on. I walked her through this and she feels more comfortable doing so now. I called Dr. Quin Hoop office due to discrepancies in insulin dosing.   Shamleffer office confirmed-   Humalog 25units with meals  (Sliding scale)  Basaglar 40units at bedtime (not on her list per dr but continue until her apt on 2/22)  I advised Jesscia to be monitoring her dexcom often, she agreed.   Appointments confirmed.   I reviewed meds and filled pill box for one week.   Diet management discussed and reviewed. Home visit complete. I will see Felicite in one week.   Refills:  Jardiance (checking with pt assistance program)  Potassium      Patient Care Team: Seward Carol, MD as PCP - General (Internal Medicine) Constance Haw, MD as PCP - Electrophysiology (Cardiology) Sueanne Margarita, MD as PCP - Sleep Medicine (Cardiology) Bensimhon, Shaune Pascal, MD as PCP - Cardiology (Cardiology) Jorge Ny, LCSW as Social Worker (Licensed Clinical Social Worker) Shamleffer, Melanie Crazier, MD as Consulting Physician (Endocrinology)  Patient Active Problem List   Diagnosis Date Noted   CAD (coronary artery disease) 06/08/2021   Carotid artery disease ([place]) 06/08/2021   Symptomatic bradycardia 06/08/2021   Jehovah's Witness - Pt cannot accept blood transfusions 06/08/2021   Paroxysmal atrial fibrillation ([place]) 06/08/2021   CKD (chronic kidney disease), stage III ([place]) 06/08/2021   Septic arthritis ([place])  04/13/2021   Polyneuropathy associated with underlying disease ([place]) 05/04/2020   Severe nonproliferative diabetic retinopathy of left eye, with macular edema, associated with type 2 diabetes mellitus ([place]) 11/05/2019   Severe nonproliferative diabetic retinopathy of right eye, with macular edema, associated with type 2 diabetes mellitus ([place]) 11/05/2019   Retinal hemorrhage of right eye 11/05/2019   Retinal hemorrhage of left eye 11/05/2019   Diabetes mellitus ([place]) 05/14/2019   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, with long-term current use of insulin ([place]) 05/14/2019   Type 2 diabetes mellitus with diabetic polyneuropathy, with long-term current use of insulin ([place]) 05/14/2019   History of gout 05/20/2018   Primary localized osteoarthritis of right knee 05/13/2018   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) 12/20/2017   Obesity (BMI 30-39.9) 11/11/2017   OSA (obstructive sleep apnea) 02/18/2017   Essential hypertension    L Breast Cancer    Hypothyroidism     Current Outpatient Medications:    acetaminophen (TYLENOL) 500 MG tablet, Take 500-1,000 mg by mouth every 6 (six) hours as needed (for pain or headaches)., Disp: , Rfl:    Alcohol Swabs PADS, See admin instructions., Disp: , Rfl:    allopurinol (ZYLOPRIM) 100 MG tablet, Take 100 mg by mouth daily., Disp: , Rfl:    apixaban (ELIQUIS) 5 MG TABS tablet, Take 1 tablet (5 mg total) by mouth 2 (two) times daily., Disp: 60 tablet, Rfl: 11   atorvastatin (LIPITOR) 80 MG tablet, Take 1 tablet (80 mg total) by mouth at bedtime.,  Disp: 90 tablet, Rfl: 3   Biotin 10 MG TABS, Take 10 mg by mouth daily., Disp: , Rfl:    Blood Glucose Monitoring Suppl (ONETOUCH VERIO) w/Device KIT, Use to check blood sugar, Disp: 1 kit, Rfl: 0   carvedilol (COREG) 12.5 MG tablet, Take 1 tablet (12.5 mg total) by mouth 2 (two) times daily with a meal., Disp: 60 tablet, Rfl: 4   cephALEXin (KEFLEX) 500 MG capsule, Take 1 capsule (500 mg  total) by mouth 2 (two) times daily., Disp: 60 capsule, Rfl: 5   clotrimazole-betamethasone (LOTRISONE) lotion, Apply 1 application topically 2 (two) times daily as needed (itching)., Disp: , Rfl:    Continuous Blood Gluc Sensor (DEXCOM G6 SENSOR) MISC, 1 Device by Does not apply route as directed., Disp: 9 each, Rfl: 3   Continuous Blood Gluc Transmit (DEXCOM G6 TRANSMITTER) MISC, 1 Device by Does not apply route as directed., Disp: 1 each, Rfl: 3   empagliflozin (JARDIANCE) 10 MG TABS tablet, Take 10 mg by mouth daily., Disp: , Rfl:    Ferrous Sulfate (IRON) 325 (65 Fe) MG TABS, Take 1 tablet (325 mg total) by mouth daily., Disp: 30 tablet, Rfl: 0   gabapentin (NEURONTIN) 300 MG capsule, Take 300 mg by mouth 2 (two) times daily., Disp: , Rfl:    insulin glargine-yfgn (SEMGLEE) 100 UNIT/ML injection, Inject 0.3 mLs (30 Units total) into the skin daily., Disp: 10 mL, Rfl: 0   insulin lispro (HUMALOG) 100 UNIT/ML injection, Inject 0.05 mLs (5 Units total) into the skin 3 (three) times daily before meals., Disp: 10 mL, Rfl: 0   latanoprost (XALATAN) 0.005 % ophthalmic solution, Place 1 drop into both eyes at bedtime., Disp: , Rfl:    levothyroxine (SYNTHROID, LEVOTHROID) 75 MCG tablet, Take 75 mcg by mouth daily before breakfast., Disp: , Rfl:    losartan (COZAAR) 25 MG tablet, Take 1 tablet (25 mg total) by mouth daily., Disp: 30 tablet, Rfl: 6   LUMIGAN 0.01 % SOLN, Place 1 drop into both eyes at bedtime., Disp: , Rfl:    Multiple Vitamins-Minerals (ALIVE ONCE DAILY WOMENS 50+ PO), Take 1 tablet by mouth daily., Disp: , Rfl:    polyethylene glycol (MIRALAX / GLYCOLAX) 17 g packet, Take 17 g by mouth daily., Disp: 14 each, Rfl: 0   potassium chloride (KLOR-CON M) 10 MEQ tablet, Take 2 tablets (20 mEq total) by mouth daily., Disp: 60 tablet, Rfl: 11   RELION PEN NEEDLE 31G/8MM 31G X 8 MM MISC, Inject 31 g as directed in the morning, at noon, and at bedtime., Disp: , Rfl:    spironolactone (ALDACTONE)  25 MG tablet, Take 0.5 tablets (12.5 mg total) by mouth daily., Disp: 45 tablet, Rfl: 3   torsemide (DEMADEX) 20 MG tablet, Take 2 tablets (40 mg total) by mouth daily., Disp: 60 tablet, Rfl: 11   Travoprost, BAK Free, (TRAVATAN) 0.004 % SOLN ophthalmic solution, Place 1 drop into both eyes at bedtime. , Disp: , Rfl:  Allergies  Allergen Reactions   Other Other (See Comments)    Blood Product Refusal (Jehovah's witness)     Sulfa Antibiotics Rash   Uloric [Febuxostat] Rash     Social History   Socioeconomic History   Marital status: Widowed    Spouse name: Not on file   Number of children: 3   Years of education: Not on file   Highest education level: Some college, no degree  Occupational History   Occupation: retired  Tobacco Use  Smoking status: Former    Packs/day: 1.00    Years: 22.00    Pack years: 22.00    Types: Cigarettes    Quit date: 45    Years since quitting: 40.1   Smokeless tobacco: Never  Vaping Use   Vaping Use: Never used  Substance and Sexual Activity   Alcohol use: Yes    Comment: 05/22/2018 "glass of wine couple times/wk"   Drug use: Not Currently   Sexual activity: Not Currently    Birth control/protection: None, Post-menopausal  Other Topics Concern   Not on file  Social History Narrative   Not on file   Social Determinants of Health   Financial Resource Strain: Not on file  Food Insecurity: Not on file  Transportation Needs: Not on file  Physical Activity: Not on file  Stress: Not on file  Social Connections: Not on file  Intimate Partner Violence: Not on file    Physical Exam Vitals reviewed.  Constitutional:      Appearance: Normal appearance. She is normal weight.  HENT:     Head: Normocephalic.     Nose: Nose normal.     Mouth/Throat:     Mouth: Mucous membranes are moist.     Pharynx: Oropharynx is clear.  Eyes:     Pupils: Pupils are equal, round, and reactive to light.  Cardiovascular:     Rate and Rhythm: Normal  rate and regular rhythm.     Pulses: Normal pulses.     Heart sounds: Normal heart sounds.  Pulmonary:     Effort: Pulmonary effort is normal.     Breath sounds: Normal breath sounds.  Abdominal:     General: Abdomen is flat.     Palpations: Abdomen is soft.  Musculoskeletal:        General: Swelling present. Normal range of motion.     Cervical back: Normal range of motion.     Right lower leg: Edema present.     Left lower leg: Edema present.     Comments: SWOLLEN JOINTS AND PAIN.   LOWER LEG EDEMA.  Skin:    General: Skin is warm and dry.     Capillary Refill: Capillary refill takes less than 2 seconds.  Neurological:     General: No focal deficit present.     Mental Status: She is alert. Mental status is at baseline.  Psychiatric:        Mood and Affect: Mood normal.        Future Appointments  Date Time Provider [place]  07/04/2021 11:00 AM [name], MD [facility]  07/17/2021 10:00 AM [facility] None  07/19/2021  2:00 PM Shamleffer, Melanie Crazier, MD [facility] None  07/25/2021 10:30 AM Laurice Record, MD [facility]  09/14/2021 11:00 AM [facility] DEVICE REMOTES [facility]  09/20/2021 10:40 AM Bensimhon, Shaune Pascal, MD [facility] None  12/14/2021 11:00 AM [facility] DEVICE REMOTES [facility]  03/15/2022 11:00 AM [facility] DEVICE [facility]OTES [facility]  06/14/2022 11:00 AM [facility]     ACTION: Home visit completed

## 2021-07-03 NOTE — Telephone Encounter (Signed)
Jardiance application was submitted and approved 11/22 refill requested and will be mailed out this week. Call complete.

## 2021-07-04 ENCOUNTER — Ambulatory Visit: Payer: Medicare HMO | Admitting: Podiatry

## 2021-07-04 ENCOUNTER — Encounter: Payer: Self-pay | Admitting: Podiatry

## 2021-07-04 ENCOUNTER — Other Ambulatory Visit: Payer: Self-pay

## 2021-07-04 DIAGNOSIS — M79675 Pain in left toe(s): Secondary | ICD-10-CM

## 2021-07-04 DIAGNOSIS — B9561 Methicillin susceptible Staphylococcus aureus infection as the cause of diseases classified elsewhere: Secondary | ICD-10-CM | POA: Diagnosis not present

## 2021-07-04 DIAGNOSIS — H3563 Retinal hemorrhage, bilateral: Secondary | ICD-10-CM | POA: Diagnosis not present

## 2021-07-04 DIAGNOSIS — L84 Corns and callosities: Secondary | ICD-10-CM

## 2021-07-04 DIAGNOSIS — M79674 Pain in right toe(s): Secondary | ICD-10-CM

## 2021-07-04 DIAGNOSIS — I130 Hypertensive heart and chronic kidney disease with heart failure and stage 1 through stage 4 chronic kidney disease, or unspecified chronic kidney disease: Secondary | ICD-10-CM | POA: Diagnosis not present

## 2021-07-04 DIAGNOSIS — M1711 Unilateral primary osteoarthritis, right knee: Secondary | ICD-10-CM | POA: Diagnosis not present

## 2021-07-04 DIAGNOSIS — E1151 Type 2 diabetes mellitus with diabetic peripheral angiopathy without gangrene: Secondary | ICD-10-CM

## 2021-07-04 DIAGNOSIS — I5043 Acute on chronic combined systolic (congestive) and diastolic (congestive) heart failure: Secondary | ICD-10-CM | POA: Diagnosis not present

## 2021-07-04 DIAGNOSIS — E113413 Type 2 diabetes mellitus with severe nonproliferative diabetic retinopathy with macular edema, bilateral: Secondary | ICD-10-CM | POA: Diagnosis not present

## 2021-07-04 DIAGNOSIS — B351 Tinea unguium: Secondary | ICD-10-CM

## 2021-07-04 DIAGNOSIS — E1165 Type 2 diabetes mellitus with hyperglycemia: Secondary | ICD-10-CM | POA: Diagnosis not present

## 2021-07-04 DIAGNOSIS — G9340 Encephalopathy, unspecified: Secondary | ICD-10-CM | POA: Diagnosis not present

## 2021-07-04 DIAGNOSIS — E1142 Type 2 diabetes mellitus with diabetic polyneuropathy: Secondary | ICD-10-CM | POA: Diagnosis not present

## 2021-07-04 DIAGNOSIS — M00061 Staphylococcal arthritis, right knee: Secondary | ICD-10-CM | POA: Diagnosis not present

## 2021-07-04 NOTE — Progress Notes (Signed)
°  Subjective:  Patient ID: Danielle [name], female    DOB: Mar 20, 1946,   MRN: [id number]  No chief complaint on file.   76 y.o. female presents for concern of thickened elongated and painful nails that are difficult to trim. Requesting to have them trimmed today. Denies any burning or tingling in her feet. Patient is diabetic and last A1c was 7.5 . Denies any other pedal complaints. Denies n/v/f/c.   PCP: Seward Carol MD   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[place]) 02/05/2017   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 05/20/2018   Acute kidney failure ([place]) 05/15/2018   Arthritis    "all over" (05/22/2018)   Blood dyscrasia    per pt-has small blood cells-appears as if anemic   Breast cancer, left breast ([place]) 1985   CHF (congestive heart failure) (HCC)    Complication of anesthesia    difficult to awaken from per pt   Coronary artery disease    Dyspnea    Essential hypertension    Heart disease    Heart murmur    History of gout    Hypothyroidism    Obesity (BMI 30-39.9) 11/11/2017   OSA (obstructive sleep apnea) 02/18/2017    severe obstructive sleep apnea with an AHI of 75.6/h and mild central sleep apnea with a CAI of 7.7/h.  Oxygen saturations dropped as low as 82%.   He is on CPAP at 9 cm H2O.   OSA on CPAP    Personal history of chemotherapy    Personal history of radiation therapy    Presence of permanent cardiac pacemaker    Primary localized osteoarthritis of right knee 05/13/2018   Refusal of blood transfusions as patient is Jehovah's Witness    Type II diabetes mellitus ([place])     Objective:  Physical Exam: Vascular: DP/PT pulses 2/4 bilateral. CFT <3 seconds. Normal hair growth on digits. No edema.  Skin. No lacerations or abrasions bilateral feet. Nails 1-5 are thickened discolored and elongated with subungual debris. Hyperkeratotic lesions noted sub left second digit and right  fifth metatarsal.  Musculoskeletal: MMT 5/5 bilateral lower extremities in DF, PF, Inversion and Eversion. Deceased ROM in DF of ankle joint.  Neurological: Sensation intact to light touch.   Assessment:   1. Type II diabetes mellitus with peripheral circulatory disorder (HCC)   2. Pain due to onychomycosis of toenails of both feet   3. Corns and callosities      Plan:  Patient was evaluated and treated and all questions answered. -Discussed and educated patient on diabetic foot care, especially with  regards to the vascular, neurological and musculoskeletal systems.  -Stressed the importance of good glycemic control and the detriment of not  controlling glucose levels in relation to the foot. -Discussed supportive shoes at all times and checking feet regularly.  -Mechanically debrided all nails 1-5 bilateral using sterile nail nipper and filed with dremel without incident  -Hyperkeratotic tissue trimmed without incident.  -Answered all patient questions -Patient to return  in 3 months for at risk foot care -Patient advised to call the office if any problems or questions arise in the meantime.   [name], DPM

## 2021-07-10 ENCOUNTER — Inpatient Hospital Stay (HOSPITAL_COMMUNITY): Payer: Medicare HMO

## 2021-07-10 ENCOUNTER — Inpatient Hospital Stay (HOSPITAL_COMMUNITY)
Admission: AD | Admit: 2021-07-10 | Discharge: 2021-08-02 | DRG: 466 | Disposition: A | Discharge status: Skilled Nursing Facility | Payer: Medicare HMO | Attending: Internal Medicine | Admitting: Internal Medicine

## 2021-07-10 DIAGNOSIS — E861 Hypovolemia: Secondary | ICD-10-CM | POA: Diagnosis not present

## 2021-07-10 DIAGNOSIS — K314 Gastric diverticulum: Secondary | ICD-10-CM | POA: Diagnosis not present

## 2021-07-10 DIAGNOSIS — J189 Pneumonia, unspecified organism: Secondary | ICD-10-CM

## 2021-07-10 DIAGNOSIS — Z9889 Other specified postprocedural states: Secondary | ICD-10-CM

## 2021-07-10 DIAGNOSIS — Z881 Allergy status to other antibiotic agents status: Secondary | ICD-10-CM

## 2021-07-10 DIAGNOSIS — K429 Umbilical hernia without obstruction or gangrene: Secondary | ICD-10-CM | POA: Diagnosis not present

## 2021-07-10 DIAGNOSIS — M00061 Staphylococcal arthritis, right knee: Secondary | ICD-10-CM

## 2021-07-10 DIAGNOSIS — E669 Obesity, unspecified: Secondary | ICD-10-CM | POA: Diagnosis present

## 2021-07-10 DIAGNOSIS — M009 Pyogenic arthritis, unspecified: Secondary | ICD-10-CM | POA: Diagnosis present

## 2021-07-10 DIAGNOSIS — R609 Edema, unspecified: Secondary | ICD-10-CM

## 2021-07-10 DIAGNOSIS — T8110XA Postprocedural shock unspecified, initial encounter: Secondary | ICD-10-CM | POA: Diagnosis not present

## 2021-07-10 DIAGNOSIS — Z961 Presence of intraocular lens: Secondary | ICD-10-CM | POA: Diagnosis present

## 2021-07-10 DIAGNOSIS — D62 Acute posthemorrhagic anemia: Secondary | ICD-10-CM | POA: Diagnosis not present

## 2021-07-10 DIAGNOSIS — R4586 Emotional lability: Secondary | ICD-10-CM | POA: Diagnosis not present

## 2021-07-10 DIAGNOSIS — Z87891 Personal history of nicotine dependence: Secondary | ICD-10-CM

## 2021-07-10 DIAGNOSIS — A4901 Methicillin susceptible Staphylococcus aureus infection, unspecified site: Secondary | ICD-10-CM

## 2021-07-10 DIAGNOSIS — I110 Hypertensive heart disease with heart failure: Secondary | ICD-10-CM | POA: Diagnosis not present

## 2021-07-10 DIAGNOSIS — Z96651 Presence of right artificial knee joint: Secondary | ICD-10-CM | POA: Diagnosis not present

## 2021-07-10 DIAGNOSIS — I82612 Acute embolism and thrombosis of superficial veins of left upper extremity: Secondary | ICD-10-CM | POA: Diagnosis not present

## 2021-07-10 DIAGNOSIS — Z923 Personal history of irradiation: Secondary | ICD-10-CM

## 2021-07-10 DIAGNOSIS — Z471 Aftercare following joint replacement surgery: Secondary | ICD-10-CM | POA: Diagnosis not present

## 2021-07-10 DIAGNOSIS — R579 Shock, unspecified: Secondary | ICD-10-CM | POA: Diagnosis not present

## 2021-07-10 DIAGNOSIS — I9581 Postprocedural hypotension: Secondary | ICD-10-CM | POA: Diagnosis not present

## 2021-07-10 DIAGNOSIS — E039 Hypothyroidism, unspecified: Secondary | ICD-10-CM | POA: Diagnosis present

## 2021-07-10 DIAGNOSIS — D649 Anemia, unspecified: Secondary | ICD-10-CM

## 2021-07-10 DIAGNOSIS — E1165 Type 2 diabetes mellitus with hyperglycemia: Secondary | ICD-10-CM | POA: Diagnosis present

## 2021-07-10 DIAGNOSIS — G4733 Obstructive sleep apnea (adult) (pediatric): Secondary | ICD-10-CM | POA: Diagnosis present

## 2021-07-10 DIAGNOSIS — Z79899 Other long term (current) drug therapy: Secondary | ICD-10-CM

## 2021-07-10 DIAGNOSIS — I82622 Acute embolism and thrombosis of deep veins of left upper extremity: Secondary | ICD-10-CM | POA: Diagnosis not present

## 2021-07-10 DIAGNOSIS — I495 Sick sinus syndrome: Secondary | ICD-10-CM | POA: Diagnosis present

## 2021-07-10 DIAGNOSIS — E1142 Type 2 diabetes mellitus with diabetic polyneuropathy: Secondary | ICD-10-CM

## 2021-07-10 DIAGNOSIS — G9341 Metabolic encephalopathy: Secondary | ICD-10-CM | POA: Diagnosis not present

## 2021-07-10 DIAGNOSIS — Z955 Presence of coronary angioplasty implant and graft: Secondary | ICD-10-CM

## 2021-07-10 DIAGNOSIS — M4316 Spondylolisthesis, lumbar region: Secondary | ICD-10-CM | POA: Diagnosis not present

## 2021-07-10 DIAGNOSIS — Z9841 Cataract extraction status, right eye: Secondary | ICD-10-CM

## 2021-07-10 DIAGNOSIS — T8453XA Infection and inflammatory reaction due to internal right knee prosthesis, initial encounter: Secondary | ICD-10-CM | POA: Diagnosis not present

## 2021-07-10 DIAGNOSIS — I10 Essential (primary) hypertension: Secondary | ICD-10-CM | POA: Diagnosis present

## 2021-07-10 DIAGNOSIS — N179 Acute kidney failure, unspecified: Secondary | ICD-10-CM | POA: Diagnosis not present

## 2021-07-10 DIAGNOSIS — E1122 Type 2 diabetes mellitus with diabetic chronic kidney disease: Secondary | ICD-10-CM | POA: Diagnosis not present

## 2021-07-10 DIAGNOSIS — Z6839 Body mass index (BMI) 39.0-39.9, adult: Secondary | ICD-10-CM

## 2021-07-10 DIAGNOSIS — N1832 Chronic kidney disease, stage 3b: Secondary | ICD-10-CM | POA: Diagnosis not present

## 2021-07-10 DIAGNOSIS — R069 Unspecified abnormalities of breathing: Secondary | ICD-10-CM

## 2021-07-10 DIAGNOSIS — I5032 Chronic diastolic (congestive) heart failure: Secondary | ICD-10-CM | POA: Diagnosis not present

## 2021-07-10 DIAGNOSIS — Y92239 Unspecified place in hospital as the place of occurrence of the external cause: Secondary | ICD-10-CM | POA: Diagnosis not present

## 2021-07-10 DIAGNOSIS — I509 Heart failure, unspecified: Secondary | ICD-10-CM | POA: Diagnosis not present

## 2021-07-10 DIAGNOSIS — Z7901 Long term (current) use of anticoagulants: Secondary | ICD-10-CM

## 2021-07-10 DIAGNOSIS — M160 Bilateral primary osteoarthritis of hip: Secondary | ICD-10-CM | POA: Diagnosis not present

## 2021-07-10 DIAGNOSIS — B9561 Methicillin susceptible Staphylococcus aureus infection as the cause of diseases classified elsewhere: Secondary | ICD-10-CM | POA: Diagnosis present

## 2021-07-10 DIAGNOSIS — Z794 Long term (current) use of insulin: Secondary | ICD-10-CM

## 2021-07-10 DIAGNOSIS — I5043 Acute on chronic combined systolic (congestive) and diastolic (congestive) heart failure: Secondary | ICD-10-CM

## 2021-07-10 DIAGNOSIS — M25461 Effusion, right knee: Secondary | ICD-10-CM | POA: Diagnosis not present

## 2021-07-10 DIAGNOSIS — Z9842 Cataract extraction status, left eye: Secondary | ICD-10-CM

## 2021-07-10 DIAGNOSIS — K5903 Drug induced constipation: Secondary | ICD-10-CM | POA: Diagnosis not present

## 2021-07-10 DIAGNOSIS — T40605A Adverse effect of unspecified narcotics, initial encounter: Secondary | ICD-10-CM | POA: Diagnosis not present

## 2021-07-10 DIAGNOSIS — Z20822 Contact with and (suspected) exposure to covid-19: Secondary | ICD-10-CM | POA: Diagnosis present

## 2021-07-10 DIAGNOSIS — Z531 Procedure and treatment not carried out because of patient's decision for reasons of belief and group pressure: Secondary | ICD-10-CM | POA: Diagnosis not present

## 2021-07-10 DIAGNOSIS — D631 Anemia in chronic kidney disease: Secondary | ICD-10-CM | POA: Diagnosis present

## 2021-07-10 DIAGNOSIS — T849XXA Unspecified complication of internal orthopedic prosthetic device, implant and graft, initial encounter: Secondary | ICD-10-CM

## 2021-07-10 DIAGNOSIS — Z950 Presence of cardiac pacemaker: Secondary | ICD-10-CM

## 2021-07-10 DIAGNOSIS — I5022 Chronic systolic (congestive) heart failure: Secondary | ICD-10-CM | POA: Diagnosis not present

## 2021-07-10 DIAGNOSIS — Z807 Family history of other malignant neoplasms of lymphoid, hematopoietic and related tissues: Secondary | ICD-10-CM

## 2021-07-10 DIAGNOSIS — T8453XD Infection and inflammatory reaction due to internal right knee prosthesis, subsequent encounter: Secondary | ICD-10-CM | POA: Diagnosis not present

## 2021-07-10 DIAGNOSIS — I4891 Unspecified atrial fibrillation: Secondary | ICD-10-CM | POA: Diagnosis not present

## 2021-07-10 DIAGNOSIS — J9811 Atelectasis: Secondary | ICD-10-CM | POA: Diagnosis not present

## 2021-07-10 DIAGNOSIS — I38 Endocarditis, valve unspecified: Secondary | ICD-10-CM | POA: Diagnosis not present

## 2021-07-10 DIAGNOSIS — E113599 Type 2 diabetes mellitus with proliferative diabetic retinopathy without macular edema, unspecified eye: Secondary | ICD-10-CM

## 2021-07-10 DIAGNOSIS — I5042 Chronic combined systolic (congestive) and diastolic (congestive) heart failure: Secondary | ICD-10-CM | POA: Diagnosis present

## 2021-07-10 DIAGNOSIS — I2510 Atherosclerotic heart disease of native coronary artery without angina pectoris: Secondary | ICD-10-CM

## 2021-07-10 DIAGNOSIS — I480 Paroxysmal atrial fibrillation: Secondary | ICD-10-CM | POA: Diagnosis not present

## 2021-07-10 DIAGNOSIS — N183 Chronic kidney disease, stage 3 unspecified: Secondary | ICD-10-CM | POA: Diagnosis present

## 2021-07-10 DIAGNOSIS — M25561 Pain in right knee: Secondary | ICD-10-CM | POA: Diagnosis not present

## 2021-07-10 DIAGNOSIS — Z9049 Acquired absence of other specified parts of digestive tract: Secondary | ICD-10-CM

## 2021-07-10 DIAGNOSIS — Z96652 Presence of left artificial knee joint: Secondary | ICD-10-CM | POA: Diagnosis present

## 2021-07-10 DIAGNOSIS — I5030 Unspecified diastolic (congestive) heart failure: Secondary | ICD-10-CM | POA: Diagnosis present

## 2021-07-10 DIAGNOSIS — J9601 Acute respiratory failure with hypoxia: Secondary | ICD-10-CM | POA: Diagnosis not present

## 2021-07-10 DIAGNOSIS — Y831 Surgical operation with implant of artificial internal device as the cause of abnormal reaction of the patient, or of later complication, without mention of misadventure at the time of the procedure: Secondary | ICD-10-CM | POA: Diagnosis present

## 2021-07-10 DIAGNOSIS — T84032A Mechanical loosening of internal right knee prosthetic joint, initial encounter: Secondary | ICD-10-CM | POA: Diagnosis present

## 2021-07-10 DIAGNOSIS — I130 Hypertensive heart and chronic kidney disease with heart failure and stage 1 through stage 4 chronic kidney disease, or unspecified chronic kidney disease: Secondary | ICD-10-CM

## 2021-07-10 DIAGNOSIS — Z853 Personal history of malignant neoplasm of breast: Secondary | ICD-10-CM

## 2021-07-10 DIAGNOSIS — Z833 Family history of diabetes mellitus: Secondary | ICD-10-CM

## 2021-07-10 DIAGNOSIS — I82409 Acute embolism and thrombosis of unspecified deep veins of unspecified lower extremity: Secondary | ICD-10-CM | POA: Diagnosis present

## 2021-07-10 DIAGNOSIS — Z8249 Family history of ischemic heart disease and other diseases of the circulatory system: Secondary | ICD-10-CM

## 2021-07-10 DIAGNOSIS — J690 Pneumonitis due to inhalation of food and vomit: Secondary | ICD-10-CM | POA: Diagnosis not present

## 2021-07-10 DIAGNOSIS — Z9221 Personal history of antineoplastic chemotherapy: Secondary | ICD-10-CM

## 2021-07-10 DIAGNOSIS — I255 Ischemic cardiomyopathy: Secondary | ICD-10-CM | POA: Diagnosis present

## 2021-07-10 DIAGNOSIS — M7989 Other specified soft tissue disorders: Secondary | ICD-10-CM | POA: Diagnosis not present

## 2021-07-10 DIAGNOSIS — Z888 Allergy status to other drugs, medicaments and biological substances status: Secondary | ICD-10-CM

## 2021-07-10 LAB — COMPREHENSIVE METABOLIC PANEL
ALT: 33 U/L (ref 0–44)
AST: 36 U/L (ref 15–41)
Albumin: 3.3 g/dL — ABNORMAL LOW (ref 3.5–5.0)
Alkaline Phosphatase: 70 U/L (ref 38–126)
Anion gap: 10 (ref 5–15)
BUN: 21 mg/dL (ref 8–23)
CO2: 29 mmol/L (ref 22–32)
Calcium: 9.2 mg/dL (ref 8.9–10.3)
Chloride: 97 mmol/L — ABNORMAL LOW (ref 98–111)
Creatinine, Ser: 1.22 mg/dL — ABNORMAL HIGH (ref 0.44–1.00)
GFR, Estimated: 47 mL/min — ABNORMAL LOW (ref >60)
Glucose, Bld: 191 mg/dL — ABNORMAL HIGH (ref 70–99)
Potassium: 3.7 mmol/L (ref 3.5–5.1)
Sodium: 136 mmol/L (ref 135–145)
Total Bilirubin: 0.9 mg/dL (ref 0.3–1.2)
Total Protein: 7.6 g/dL (ref 6.5–8.1)

## 2021-07-10 LAB — HEMOGLOBIN A1C
Hgb A1c MFr Bld: 8.2 % — ABNORMAL HIGH (ref 4.8–5.6)
Mean Plasma Glucose: 188.64 mg/dL

## 2021-07-10 LAB — CBC
HCT: 34.3 % — ABNORMAL LOW (ref 36.0–46.0)
Hemoglobin: 10.3 g/dL — ABNORMAL LOW (ref 12.0–15.0)
MCH: 20.7 pg — ABNORMAL LOW (ref 26.0–34.0)
MCHC: 30 g/dL (ref 30.0–36.0)
MCV: 69 fL — ABNORMAL LOW (ref 80.0–100.0)
Platelets: 219 K/uL (ref 150–400)
RBC: 4.97 MIL/uL (ref 3.87–5.11)
RDW: 15.9 % — ABNORMAL HIGH (ref 11.5–15.5)
WBC: 9 K/uL (ref 4.0–10.5)
nRBC: 0 % (ref 0.0–0.2)

## 2021-07-10 LAB — GLUCOSE, CAPILLARY
Glucose-Capillary: 194 mg/dL — ABNORMAL HIGH (ref 70–99)
Glucose-Capillary: 194 mg/dL — ABNORMAL HIGH (ref 70–99)
Glucose-Capillary: 192 mg/dL — ABNORMAL HIGH (ref 70–99)

## 2021-07-10 LAB — SEDIMENTATION RATE: Sed Rate: 64 mm/h — ABNORMAL HIGH (ref 0–22)

## 2021-07-10 LAB — C-REACTIVE PROTEIN: CRP: 28.2 mg/dL — ABNORMAL HIGH (ref <1.0)

## 2021-07-10 MED ORDER — BISACODYL 5 MG PO TBEC
5.0000 mg | DELAYED_RELEASE_TABLET | Freq: Every day | ORAL | Status: DC | PRN
  Administered 2021-07-14 – 2021-07-22 (×6): 5 mg via ORAL
  Filled 2021-07-10 (×6): qty 1

## 2021-07-10 MED ORDER — HYDROMORPHONE HCL 1 MG/ML IJ SOLN
0.5000 mg | INTRAMUSCULAR | Status: DC | PRN
  Administered 2021-07-11 – 2021-07-13 (×4): 1 mg via INTRAVENOUS
  Filled 2021-07-10 (×4): qty 1

## 2021-07-10 MED ORDER — ACETAMINOPHEN 500 MG PO TABS
1000.0000 mg | ORAL_TABLET | Freq: Four times a day (QID) | ORAL | Status: AC
  Administered 2021-07-10 (×2): 1000 mg via ORAL
  Filled 2021-07-10 (×4): qty 2

## 2021-07-10 MED ORDER — ENOXAPARIN SODIUM 40 MG/0.4ML IJ SOSY
40.0000 mg | PREFILLED_SYRINGE | INTRAMUSCULAR | Status: DC
  Administered 2021-07-10 – 2021-07-11 (×2): 40 mg via SUBCUTANEOUS
  Filled 2021-07-10 (×2): qty 0.4

## 2021-07-10 MED ORDER — POTASSIUM CHLORIDE IN NACL 20-0.9 MEQ/L-% IV SOLN
INTRAVENOUS | Status: DC
  Filled 2021-07-10: qty 1000

## 2021-07-10 MED ORDER — ONDANSETRON HCL 4 MG/2ML IJ SOLN: 4.0000 mg | Freq: Four times a day (QID) | INTRAMUSCULAR | Status: DC | PRN

## 2021-07-10 MED ORDER — SENNA 8.6 MG PO TABS
1.0000 | ORAL_TABLET | Freq: Two times a day (BID) | ORAL | Status: DC
  Administered 2021-07-10 – 2021-08-02 (×42): 8.6 mg via ORAL
  Filled 2021-07-10 (×45): qty 1

## 2021-07-10 MED ORDER — GABAPENTIN 300 MG PO CAPS
300.0000 mg | ORAL_CAPSULE | Freq: Two times a day (BID) | ORAL | Status: DC
  Administered 2021-07-10 – 2021-07-13 (×6): 300 mg via ORAL
  Filled 2021-07-10 (×6): qty 1

## 2021-07-10 MED ORDER — CEFAZOLIN SODIUM-DEXTROSE 2-4 GM/100ML-% IV SOLN
2.0000 g | Freq: Three times a day (TID) | INTRAVENOUS | Status: DC
  Administered 2021-07-10 – 2021-08-02 (×68): 2 g via INTRAVENOUS
  Filled 2021-07-10 (×70): qty 100

## 2021-07-10 MED ORDER — INSULIN GLARGINE-YFGN 100 UNIT/ML ~~LOC~~ SOLN
40.0000 [IU] | Freq: Every day | SUBCUTANEOUS | Status: DC
  Administered 2021-07-10 – 2021-07-22 (×10): 40 [IU] via SUBCUTANEOUS
  Filled 2021-07-10 (×16): qty 0.4

## 2021-07-10 MED ORDER — BASAGLAR KWIKPEN 100 UNIT/ML ~~LOC~~ SOPN: 40.0000 [IU] | PEN_INJECTOR | Freq: Every day | SUBCUTANEOUS | Status: DC

## 2021-07-10 MED ORDER — OXYCODONE HCL 5 MG PO TABS
5.0000 mg | ORAL_TABLET | ORAL | Status: DC | PRN
  Administered 2021-07-13: 06:00:00 10 mg via ORAL
  Filled 2021-07-10: qty 1
  Filled 2021-07-10 (×2): qty 2
  Filled 2021-07-10: qty 1

## 2021-07-10 MED ORDER — SPIRONOLACTONE 12.5 MG HALF TABLET
12.5000 mg | ORAL_TABLET | Freq: Every day | ORAL | Status: DC
  Administered 2021-07-11: 12.5 mg via ORAL
  Filled 2021-07-10 (×2): qty 1

## 2021-07-10 MED ORDER — ONDANSETRON HCL 4 MG PO TABS: 4.0000 mg | ORAL_TABLET | Freq: Four times a day (QID) | ORAL | Status: DC | PRN

## 2021-07-10 MED ORDER — ATORVASTATIN CALCIUM 80 MG PO TABS
80.0000 mg | ORAL_TABLET | Freq: Every day | ORAL | Status: DC
  Administered 2021-07-10 – 2021-08-01 (×21): 80 mg via ORAL
  Filled 2021-07-10 (×9): qty 1
  Filled 2021-07-10: qty 2
  Filled 2021-07-10 (×11): qty 1
  Filled 2021-07-10: qty 2

## 2021-07-10 MED ORDER — DIPHENHYDRAMINE HCL 12.5 MG/5ML PO ELIX
12.5000 mg | ORAL_SOLUTION | ORAL | Status: DC | PRN
  Administered 2021-07-18: 25 mg via ORAL
  Filled 2021-07-10: qty 10

## 2021-07-10 MED ORDER — LEVOTHYROXINE SODIUM 75 MCG PO TABS
75.0000 ug | ORAL_TABLET | Freq: Every day | ORAL | Status: DC
  Administered 2021-07-11 – 2021-08-02 (×22): 75 ug via ORAL
  Filled 2021-07-10 (×14): qty 1
  Filled 2021-07-10 (×2): qty 3
  Filled 2021-07-10 (×8): qty 1

## 2021-07-10 MED ORDER — ACETAMINOPHEN 325 MG PO TABS
325.0000 mg | ORAL_TABLET | Freq: Four times a day (QID) | ORAL | Status: DC | PRN
  Administered 2021-07-13 – 2021-07-31 (×22): 650 mg via ORAL
  Filled 2021-07-10 (×24): qty 2

## 2021-07-10 MED ORDER — FLEET ENEMA 7-19 GM/118ML RE ENEM
1.0000 | ENEMA | Freq: Once | RECTAL | Status: DC | PRN
  Filled 2021-07-10: qty 1

## 2021-07-10 MED ORDER — CARVEDILOL 12.5 MG PO TABS
12.5000 mg | ORAL_TABLET | Freq: Two times a day (BID) | ORAL | Status: DC
  Administered 2021-07-10 – 2021-07-12 (×4): 12.5 mg via ORAL
  Filled 2021-07-10 (×4): qty 1

## 2021-07-10 MED ORDER — TORSEMIDE 20 MG PO TABS
40.0000 mg | ORAL_TABLET | Freq: Every day | ORAL | Status: DC
  Administered 2021-07-11: 40 mg via ORAL
  Filled 2021-07-10: qty 2

## 2021-07-10 MED ORDER — METHOCARBAMOL 500 MG PO TABS
500.0000 mg | ORAL_TABLET | Freq: Four times a day (QID) | ORAL | Status: DC | PRN
  Administered 2021-07-10 – 2021-07-13 (×3): 500 mg via ORAL
  Filled 2021-07-10 (×3): qty 1

## 2021-07-10 MED ORDER — INSULIN ASPART 100 UNIT/ML IJ SOLN: 0.0000 [IU] | Freq: Every day | INTRAMUSCULAR | Status: DC

## 2021-07-10 MED ORDER — OXYCODONE HCL 5 MG PO TABS
10.0000 mg | ORAL_TABLET | ORAL | Status: DC | PRN
  Administered 2021-07-10 – 2021-07-11 (×4): 15 mg via ORAL
  Administered 2021-07-13 – 2021-07-15 (×3): 10 mg via ORAL
  Filled 2021-07-10: qty 3
  Filled 2021-07-10 (×2): qty 2
  Filled 2021-07-10 (×3): qty 3

## 2021-07-10 MED ORDER — INSULIN ASPART 100 UNIT/ML IJ SOLN
0.0000 [IU] | Freq: Three times a day (TID) | INTRAMUSCULAR | Status: DC
  Administered 2021-07-10: 3 [IU] via SUBCUTANEOUS
  Administered 2021-07-11 – 2021-07-12 (×3): 2 [IU] via SUBCUTANEOUS
  Administered 2021-07-13: 13:00:00 3 [IU] via SUBCUTANEOUS
  Administered 2021-07-13: 2 [IU] via SUBCUTANEOUS
  Administered 2021-07-13 – 2021-07-14 (×3): 3 [IU] via SUBCUTANEOUS
  Administered 2021-07-15 – 2021-07-16 (×4): 2 [IU] via SUBCUTANEOUS
  Administered 2021-07-16: 3 [IU] via SUBCUTANEOUS
  Administered 2021-07-16 – 2021-08-01 (×9): 2 [IU] via SUBCUTANEOUS

## 2021-07-10 MED ORDER — LOSARTAN POTASSIUM 50 MG PO TABS
25.0000 mg | ORAL_TABLET | Freq: Every day | ORAL | Status: DC
  Administered 2021-07-11: 25 mg via ORAL
  Filled 2021-07-10: qty 1

## 2021-07-10 MED ORDER — POLYETHYLENE GLYCOL 3350 17 G PO PACK
17.0000 g | PACK | Freq: Every day | ORAL | Status: DC | PRN
  Administered 2021-07-15 – 2021-07-16 (×2): 17 g via ORAL
  Filled 2021-07-10 (×2): qty 1

## 2021-07-10 MED ORDER — EMPAGLIFLOZIN 10 MG PO TABS
10.0000 mg | ORAL_TABLET | Freq: Every day | ORAL | Status: DC
  Administered 2021-07-11 – 2021-08-02 (×22): 10 mg via ORAL
  Filled 2021-07-10 (×23): qty 1

## 2021-07-10 MED ORDER — METHOCARBAMOL 1000 MG/10ML IJ SOLN
500.0000 mg | Freq: Four times a day (QID) | INTRAVENOUS | Status: DC | PRN
  Administered 2021-07-13: 500 mg via INTRAVENOUS
  Filled 2021-07-10 (×2): qty 5
  Filled 2021-07-10: qty 500

## 2021-07-10 NOTE — Consult Note (Signed)
History and Physical    Danielle [name] IRW:[id number] DOB: 18-Aug-1945 DOA: 07/10/2021  PCP: Seward Carol, MD (Confirm with patient/family/NH records and if not entered, this has to be entered at [facility] point of entry) Patient coming from: Home  I have personally briefly reviewed patient's old medical records in [place]  Chief Complaint: My knee swell  HPI: Danielle [name] is a 76 y.o. 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  At baseline she is not very active person due to her body habitus and knee problems.  She has CAD and had stenting x5 or more than 5 years ago in [place].  No recent stress test.  She denies any chest pain no shortness of breath.  She does report recent more swelling of the right lower extremity, but she attributed that to recurrent right knee infection.  She denies any fever or chills, right knee pain is controlled.   Review of Systems: As per HPI otherwise 14 point review of systems negative.    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[place]) 02/05/2017   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 05/20/2018   Acute kidney failure ([place]) 05/15/2018   Arthritis    "all over" (05/22/2018)   Blood dyscrasia    per pt-has small blood cells-appears as if anemic   Breast cancer, left breast ([place]) 1985   CHF (congestive heart failure) (HCC)    Complication of anesthesia    difficult to awaken from per pt   Coronary artery disease    Dyspnea    Essential hypertension    Heart disease    Heart murmur    History of gout    Hypothyroidism    Obesity (BMI 30-39.9) 11/11/2017   OSA  (obstructive sleep apnea) 02/18/2017    severe obstructive sleep apnea with an AHI of 75.6/h and mild central sleep apnea with a CAI of 7.7/h.  Oxygen saturations dropped as low as 82%.   He is on CPAP at 9 cm H2O.   OSA on CPAP    Personal history of chemotherapy    Personal history of radiation therapy    Presence of permanent cardiac pacemaker    Primary localized osteoarthritis of right knee 05/13/2018   Refusal of blood transfusions as patient is Jehovah's Witness    Type II diabetes mellitus ([place])     Past Surgical History:  Procedure Laterality Date   APPLICATION OF WOUND VAC Right 04/14/2021   Procedure: APPLICATION OF WOUND VAC;  Surgeon: Willaim Sheng, MD;  Location: [facility];  Service: Orthopedics;  Laterality: Right;   BREAST BIOPSY Left 1985   BREAST LUMPECTOMY Left 1985   BREAST LUMPECTOMY WITH NEEDLE LOCALIZATION AND AXILLARY LYMPH NODE DISSECTION  1985   CATARACT EXTRACTION W/ INTRAOCULAR LENS  IMPLANT, BILATERAL Bilateral    CORONARY ANGIOPLASTY WITH STENT PLACEMENT  2010, 2012,2017    2 done 2010 and 1 replaced 2012 and 2 replaced in 2017   [place]     I & D KNEE WITH POLY EXCHANGE Right 04/14/2021   Procedure: IRRIGATION AND DEBRIDEMENT KNEE WITH POLY EXCHANGE;  Surgeon: Willaim Sheng, MD;  Location:  [facility];  Service: Orthopedics;  Laterality: Right;   INSERT / REPLACE / REMOVE PACEMAKER  07/19/2014   JOINT REPLACEMENT     REPLACEMENT TOTAL KNEE Left 09/2012   THYROIDECTOMY, PARTIAL  1978   TONSILLECTOMY     TOTAL KNEE ARTHROPLASTY Right 05/13/2018   Procedure: TOTAL KNEE ARTHROPLASTY;  Surgeon: Marchia Bond, MD;  Location: [facility];  Service: Orthopedics;  Laterality: Right;   TUBAL LIGATION       reports that she quit smoking about 40 years ago. Her smoking use included cigarettes. She has a 22.00 pack-year smoking history. She has never used smokeless tobacco. She reports current alcohol use. She reports that she does not  currently use drugs.  Allergies  Allergen Reactions   Other Other (See Comments)    Blood Product Refusal (Jehovah's witness)     Tape Other (See Comments)    Leaves blisters and marks on the skin   Sulfa Antibiotics Rash   Uloric [Febuxostat] Rash    Family History  Problem Relation Age of Onset   Multiple myeloma Mother    Heart disease Father    Other Sister    Heart disease Brother    Diabetes Sister    Other Sister      Prior to Admission medications   Medication Sig Start Date End Date Taking? Authorizing Provider  acetaminophen (TYLENOL) 500 MG tablet Take 500-1,000 mg by mouth every 6 (six) hours as needed (for pain or headaches).    [provider]  allopurinol (ZYLOPRIM) 100 MG tablet Take 100 mg by mouth daily.    [provider]  apixaban (ELIQUIS) 5 MG TABS tablet Take 1 tablet (5 mg total) by mouth 2 (two) times daily. 10/19/20   Bensimhon, Shaune Pascal, MD  atorvastatin (LIPITOR) 80 MG tablet Take 1 tablet (80 mg total) by mouth at bedtime. 03/29/21   Bensimhon, Shaune Pascal, MD  Biotin 10 MG TABS Take 10 mg by mouth daily.    [provider]  Blood Glucose Monitoring Suppl (ONETOUCH VERIO) w/Device KIT Use to check blood sugar 01/10/21   Shamleffer, Melanie Crazier, MD  carvedilol (COREG) 12.5 MG tablet Take 1 tablet (12.5 mg total) by mouth 2 (two) times daily with a meal. 06/13/21   Bensimhon, Shaune Pascal, MD  cephALEXin (KEFLEX) 500 MG capsule Take 1 capsule (500 mg total) by mouth 2 (two) times daily. 05/24/21   Laurice Record, MD  clotrimazole-betamethasone (LOTRISONE) lotion Apply 1 application topically 2 (two) times daily as needed (itching). 06/16/20   [provider]  Continuous Blood Gluc Sensor (DEXCOM G6 SENSOR) MISC 1 Device by Does not apply route as directed. 01/19/21   Shamleffer, Melanie Crazier, MD  Continuous Blood Gluc Transmit (DEXCOM G6 TRANSMITTER) MISC 1 Device by Does not apply route as directed. 01/19/21   Shamleffer,  Melanie Crazier, MD  empagliflozin (JARDIANCE) 10 MG TABS tablet Take 10 mg by mouth daily.    [provider]  Ferrous Sulfate (IRON) 325 (65 Fe) MG TABS Take 1 tablet (325 mg total) by mouth daily. Patient taking differently: Take 1 tablet by mouth every other day. 03/21/21   Bensimhon, Shaune Pascal, MD  gabapentin (NEURONTIN) 300 MG capsule Take 300 mg by mouth 2 (two) times daily.    [provider]  HYDROcodone-acetaminophen (NORCO/VICODIN) 5-325 MG tablet Take 1 tablet by mouth every 4 (four) hours as needed for pain. 07/10/21   [provider]  Insulin Glargine (BASAGLAR KWIKPEN) 100 UNIT/ML Inject 40 Units into  the skin at bedtime. 05/26/21   [provider]  insulin glargine-yfgn (SEMGLEE) 100 UNIT/ML injection Inject 0.3 mLs (30 Units total) into the skin daily. 04/24/21   Kayleen Memos, DO  insulin lispro (HUMALOG) 100 UNIT/ML injection Inject 0.05 mLs (5 Units total) into the skin 3 (three) times daily before meals. Patient taking differently: Inject 5-15 Units into the skin 3 (three) times daily before meals. Per sliding scale 04/24/21   Kayleen Memos, DO  latanoprost (XALATAN) 0.005 % ophthalmic solution Place 1 drop into both eyes at bedtime.    [provider]  levothyroxine (SYNTHROID, LEVOTHROID) 75 MCG tablet Take 75 mcg by mouth daily before breakfast.    [provider]  losartan (COZAAR) 25 MG tablet Take 1 tablet (25 mg total) by mouth daily. 06/13/21   Bensimhon, Shaune Pascal, MD  LUMIGAN 0.01 % SOLN Place 1 drop into both eyes at bedtime. 07/23/20   [provider]  Multiple Vitamins-Minerals (ALIVE ONCE DAILY WOMENS 50+ PO) Take 1 tablet by mouth daily.    [provider]  polyethylene glycol (MIRALAX / GLYCOLAX) 17 g packet Take 17 g by mouth daily. 04/25/21   Kayleen Memos, DO  potassium chloride (KLOR-CON M) 10 MEQ tablet Take 2 tablets (20 mEq total) by mouth daily. 05/30/21   Bensimhon, Shaune Pascal, MD   spironolactone (ALDACTONE) 25 MG tablet Take 0.5 tablets (12.5 mg total) by mouth daily. 06/15/21 09/13/21  Joette Catching, PA-C  torsemide (DEMADEX) 20 MG tablet Take 2 tablets (40 mg total) by mouth daily. 10/19/20   Bensimhon, Shaune Pascal, MD  Travoprost, BAK Free, (TRAVATAN) 0.004 % SOLN ophthalmic solution Place 1 drop into both eyes at bedtime.     [provider]    Physical Exam: Vitals:   07/10/21 1238 07/10/21 1758  BP: (!) 162/76 (!) 127/59  Pulse: 75 79  Resp: 18 18  Temp: 98.2 F (36.8 C) 98.2 F (36.8 C)  TempSrc: Oral   SpO2: 100% 95%    Constitutional: NAD, calm, comfortable Vitals:   07/10/21 1238 07/10/21 1758  BP: (!) 162/76 (!) 127/59  Pulse: 75 79  Resp: 18 18  Temp: 98.2 F (36.8 C) 98.2 F (36.8 C)  TempSrc: Oral   SpO2: 100% 95%   Eyes: PERRL, lids and conjunctivae normal ENMT: Mucous membranes are moist. Posterior pharynx clear of any exudate or lesions.Normal dentition.  Neck: normal, supple, no masses, no thyromegaly Respiratory: clear to auscultation bilaterally, no wheezing, fine crackles on bilateral bases, normal respiratory effort. No accessory muscle use.  Cardiovascular: Regular rate and rhythm, no murmurs / rubs / gallops. No extremity edema. 2+ pedal pulses. No carotid bruits.  Abdomen: no tenderness, no masses palpated. No hepatosplenomegaly. Bowel sounds positive.  Musculoskeletal: Swelling and tenderness of right knee with significant reduced ROM. Skin: no rashes, lesions, ulcers. No induration Neurologic: CN 2-12 grossly intact. Sensation intact, DTR normal. Strength 5/5 in all 4.  Psychiatric: Normal judgment and insight. Alert and oriented x 3. Normal mood.     Labs on Admission: I have personally reviewed following labs and imaging studies  CBC: Recent Labs  Lab 07/10/21 1404  WBC 9.0  HGB 10.3*  HCT 34.3*  MCV 69.0*  PLT 280   Basic Metabolic Panel: Recent Labs  Lab 07/10/21 1404  NA 136  K 3.7  CL  97*  CO2 29  GLUCOSE 191*  BUN 21  CREATININE 1.22*  CALCIUM 9.2   GFR: Estimated Creatinine Clearance:  45.4 mL/min (A) (by C-G formula based on SCr of 1.22 mg/dL (H)). Liver Function Tests: Recent Labs  Lab 07/10/21 1404  AST 36  ALT 33  ALKPHOS 70  BILITOT 0.9  PROT 7.6  ALBUMIN 3.3*   No results for input(s): LIPASE, AMYLASE in the last 168 hours. No results for input(s): AMMONIA in the last 168 hours. Coagulation Profile: No results for input(s): INR, PROTIME in the last 168 hours. Cardiac Enzymes: No results for input(s): CKTOTAL, CKMB, CKMBINDEX, TROPONINI in the last 168 hours. BNP (last 3 results) No results for input(s): PROBNP in the last 8760 hours. HbA1C: Recent Labs    07/10/21 1408  HGBA1C 8.2*   CBG: Recent Labs  Lab 07/10/21 1326 07/10/21 1537  GLUCAP 194* 194*   Lipid Profile: No results for input(s): CHOL, HDL, LDLCALC, TRIG, CHOLHDL, LDLDIRECT in the last 72 hours. Thyroid Function Tests: No results for input(s): TSH, T4TOTAL, FREET4, T3FREE, THYROIDAB in the last 72 hours. Anemia Panel: No results for input(s): VITAMINB12, FOLATE, FERRITIN, TIBC, IRON, RETICCTPCT in the last 72 hours. Urine analysis:    Component Value Date/Time   COLORURINE YELLOW 05/20/2018 1500   APPEARANCEUR CLEAR 05/20/2018 1500   LABSPEC 1.011 05/20/2018 1500   PHURINE 5.0 05/20/2018 1500   GLUCOSEU >=500 (A) 05/20/2018 1500   HGBUR NEGATIVE 05/20/2018 1500   BILIRUBINUR NEGATIVE 05/20/2018 1500   KETONESUR NEGATIVE 05/20/2018 1500   PROTEINUR NEGATIVE 05/20/2018 1500   NITRITE NEGATIVE 05/20/2018 1500   LEUKOCYTESUR NEGATIVE 05/20/2018 1500    Radiological Exams on Admission: DG Chest Port 1 View  Result Date: 07/10/2021 CLINICAL DATA:  CHF EXAM: PORTABLE CHEST 1 VIEW COMPARISON:  04/13/2021 FINDINGS: Left pacer remains in place, unchanged. Heart is borderline in size. Aortic atherosclerosis. No confluent opacities, effusions or edema. No acute bony  abnormality. IMPRESSION: No active disease. Electronically Signed   By: Rolm Baptise M.D.   On: 07/10/2021 19:13    EKG: Ordered  Assessment/Plan Principal Problem:   Infection of prosthetic right knee joint (HCC) Active Problems:   Type 2 diabetes mellitus with diabetic polyneuropathy, with long-term current use of insulin (HCC)   Septic arthritis (HCC)   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)   MSSA (methicillin susceptible Staphylococcus aureus) infection  (please populate well all problems here in Problem List. (For example, if patient is on BP meds at home and you resume or decide to hold them, it is a problem that needs to be her. Same for CAD, COPD, HLD and so on)  CAD -Technically difficult to evaluate patient's exercise tolerance given her body habitus and chronic knee problems, discussed with on-call cardiology Dr. Harl Bowie.  Cardiology will help [place] for pre-op evaluation.  Acute on chronic combined systolic and diastolic CHF, ejection -No hypoxia, chest x-ray shows similar image as before.  Physical exam shows mild fluid overload.  I believe continue current torsemide 40 mg daily is appropriate.  But we have discussed with cardiology  Right leg swelling -She is on Eliquis, however continue to have increasing right leg swelling, will order DVT study.  PAF -Eliquis on hold for incoming surgery, will start chemical DVT prophylaxis.  HTN -Fairly controlled, continue Coreg, losartan and Aldactone.  IDDM -Sliding scale and Lantus 40 units daily  CKD stage IIIa -Creatinine level stable, continue losartan and torsemide.  Right knee septic arthritis -On renal dosed Ancef, schedule OR for Wednesday.  DVT prophylaxis: Lovenox    Lequita Halt MD [facility] Pager [phone]  07/10/2021,  7:24 PM

## 2021-07-10 NOTE — Consult Note (Signed)
Danielle [name] for Infectious Disease    Date of Admission:  07/10/2021     Reason for Consult: PJI     Referring Physician: Dr Zachery Dakins   ASSESSMENT:    76 y.o. female admitted with:  Recurrent right knee PJI: Status post initial DAIR procedure in November 2022 with OR cultures at that time growing MSSA.  She received 6 weeks of cefazolin followed by suppressive Keflex.  Infection recurred in setting of suppression plus having ran out of her antibiotics for approximately 1 week prior to admission, however, she was having worsening pain even on Keflex.  She is planned for explant and antibiotic spacer on Wednesday with orthopedics.  Baseline ESR is 64, CRP is 28.2. Diabetes: Current A1c is 8.2. CKD 3: Creatinine clearance is 45.4 with creatinine 1.22 at baseline. Atrial fibrillation: Patient is on Eliquis for this and rifampin was not used in setting of her PJI due to this drug interaction. HTN, CHF, CAD: Patient seen by cardiology today for surgical clearance.  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)   Type 2 diabetes mellitus with diabetic polyneuropathy, with long-term current use of insulin (HCC)   Septic arthritis (HCC)   Paroxysmal atrial fibrillation (HCC)   CKD (chronic kidney disease), stage III (HCC)   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)   MSSA (methicillin susceptible Staphylococcus aureus) infection   CHF (congestive heart failure) (HCC)   MEDICATIONS:    Scheduled Meds:  acetaminophen  1,000 mg Oral Q6H   atorvastatin  80 mg Oral QHS   carvedilol  12.5 mg Oral BID WC    empagliflozin  10 mg Oral Daily   enoxaparin (LOVENOX) injection  40 mg Subcutaneous Q24H   gabapentin  300 mg Oral BID   insulin aspart  0-15 Units Subcutaneous TID WC   insulin aspart  0-5 Units Subcutaneous QHS   insulin glargine-yfgn  40 Units Subcutaneous QHS   [START ON 07/11/2021] levothyroxine  75 mcg Oral Q0600   [START ON 07/11/2021] losartan  25 mg Oral Daily   senna  1 tablet Oral BID   [START ON 07/11/2021] spironolactone  12.5 mg Oral Daily   [START ON 07/11/2021] torsemide  40 mg Oral Daily   Continuous Infusions:   ceFAZolin (ANCEF) IV 2 g (07/10/21 2122)   methocarbamol (ROBAXIN) IV     PRN Meds:.[START ON 07/11/2021] acetaminophen, bisacodyl, diphenhydrAMINE, HYDROmorphone (DILAUDID) injection, methocarbamol **OR** methocarbamol (ROBAXIN) IV, ondansetron **OR** ondansetron (ZOFRAN) IV, oxyCODONE, oxyCODONE, polyethylene glycol, sodium phosphate  HPI:    Danielle [name] is a 76 y.o. female with history of left breast cancer, CHF, CAD, symptomatic bradycardia s/p PPM, HTN, DM, CKD, atrial fibrillation, OSA who is admitted with recurrent right knee PJI.  She has hx of right TKA in December 2019 with Dr Mardelle Matte.  She developed PJI of this knee in November 2022 due to MSSA.  She underwent DAIR in November followed by a prolonged course of cefazolin.  Rifampin was not used due to drug interaction with her Eliquis.  She did fairly well and was seen by Dr Candiss Norse in December at which point she was  transitioned to oral Keflex for suppression.  Unfortunately, over the past 2-3 weeks patient has noted worsening pain in her knee and difficulty walking.  She also states that she ran out of her Keflex about 1 week ago.  She was seen in the orthopedics clinic where there was concern for recurrent infection.  She had an aspiration that reportedly was purulent.  She was admitted and plan is for explant and antibiotic spacer placement on Wednesday.  She was started on Cefazolin today and we are consulted  for further recommendations.    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[place]) 02/05/2017   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 05/20/2018   Acute kidney failure ([place]) 05/15/2018   Arthritis    "all over" (05/22/2018)   Blood dyscrasia    per pt-has small blood cells-appears as if anemic   Breast cancer, left breast ([place]) 1985   CHF (congestive heart failure) (HCC)    Complication of anesthesia    difficult to awaken from per pt   Coronary artery disease    Dyspnea    Essential hypertension    Heart disease    Heart murmur    History of gout    Hypothyroidism    Obesity (BMI 30-39.9) 11/11/2017   OSA (obstructive sleep apnea) 02/18/2017    severe obstructive sleep apnea with an AHI of 75.6/h and mild central sleep apnea with a CAI of 7.7/h.  Oxygen saturations dropped as low as 82%.   He is on CPAP at 9 cm H2O.   OSA on CPAP    Personal history of chemotherapy    Personal history of radiation therapy    Presence of permanent cardiac pacemaker    Primary localized osteoarthritis of right knee 05/13/2018   Refusal of blood transfusions as patient is Jehovah's Witness    Type II diabetes mellitus ([place])     Social History   Tobacco Use   Smoking status: Former    Packs/day: 1.00    Years: 22.00    Pack years: 22.00    Types: Cigarettes    Quit date: 55    Years since quitting: 40.1   Smokeless tobacco: Never  Vaping Use   Vaping Use: Never used  Substance Use Topics   Alcohol use: Yes    Comment: 05/22/2018 "glass of wine couple times/wk"   Drug use: Not Currently    Family History  Problem Relation Age of Onset   Multiple myeloma Mother    Heart disease Father    Other Sister    Heart disease Brother    Diabetes Sister    Other Sister     Allergies  Allergen Reactions   Other Other (See Comments)    Blood Product Refusal (Jehovah's witness)     Tape Other (See  Comments)    Leaves blisters and marks on the skin   Sulfa Antibiotics Rash   Uloric [Febuxostat] Rash    Review of Systems  Constitutional: Negative.   Gastrointestinal: Negative.   Musculoskeletal:  Positive for joint pain.  Skin: Negative.   All other systems reviewed and are negative.  OBJECTIVE:   Blood pressure (!) 132/51, pulse 70, temperature 98.7 F (37.1 C), temperature source Oral, resp. rate 18, SpO2 98 %. There is no height or weight on file to calculate BMI.  Physical Exam Constitutional:      General: She is not in acute distress.  Appearance: Normal appearance.  HENT:     Head: Normocephalic and atraumatic.  Eyes:     Extraocular Movements: Extraocular movements intact.     Conjunctiva/sclera: Conjunctivae normal.  Cardiovascular:     Rate and Rhythm: Normal rate and regular rhythm.  Pulmonary:     Effort: Pulmonary effort is normal. No respiratory distress.  Abdominal:     General: There is no distension.     Palpations: Abdomen is soft.     Tenderness: There is no abdominal tenderness.  Musculoskeletal:        General: Swelling and tenderness present.     Cervical back: Normal range of motion and neck supple.     Comments: Right knee warm and tender to touch.   Skin:    General: Skin is warm and dry.  Neurological:     General: No focal deficit present.     Mental Status: She is alert and oriented to person, place, and time.  Psychiatric:        Mood and Affect: Mood normal.        Behavior: Behavior normal.     Lab Results: Lab Results  Component Value Date   WBC 9.0 07/10/2021   HGB 10.3 (L) 07/10/2021   HCT 34.3 (L) 07/10/2021   MCV 69.0 (L) 07/10/2021   PLT 219 07/10/2021    Lab Results  Component Value Date   NA 136 07/10/2021   K 3.7 07/10/2021   CO2 29 07/10/2021   GLUCOSE 191 (H) 07/10/2021   BUN 21 07/10/2021   CREATININE 1.22 (H) 07/10/2021   CALCIUM 9.2 07/10/2021   GFRNONAA 47 (L) 07/10/2021   GFRAA 33 (L)  01/21/2020    Lab Results  Component Value Date   ALT 33 07/10/2021   AST 36 07/10/2021   ALKPHOS 70 07/10/2021   BILITOT 0.9 07/10/2021       Component Value Date/Time   CRP 28.2 (H) 07/10/2021 1404       Component Value Date/Time   ESRSEDRATE 64 (H) 07/10/2021 1404    I have reviewed the micro and lab results in Epic.  Imaging: DG Chest Port 1 View  Result Date: 07/10/2021 CLINICAL DATA:  CHF EXAM: PORTABLE CHEST 1 VIEW COMPARISON:  04/13/2021 FINDINGS: Left pacer remains in place, unchanged. Heart is borderline in size. Aortic atherosclerosis. No confluent opacities, effusions or edema. No acute bony abnormality. IMPRESSION: No active disease. Electronically Signed   By: Rolm Baptise M.D.   On: 07/10/2021 19:13     Imaging independently reviewed in Epic.  Raynelle Highland for Infectious Disease [facility] [phone] pager 07/10/2021, 9:43 PM

## 2021-07-10 NOTE — Progress Notes (Signed)
Pharmacy Antibiotic Note  Danielle [name] is a 76 y.o. female admitted on 07/10/2021 with  prosthetic joint infection .  Pharmacy has been consulted for cefazolin dosing.  Plan: Cefazolin 2 grams IV q8h     Temp (24hrs), Avg:98.2 F (36.8 C), Min:98.2 F (36.8 C), Max:98.2 F (36.8 C)  No results for input(s): WBC, CREATININE, LATICACIDVEN, VANCOTROUGH, VANCOPEAK, VANCORANDOM, GENTTROUGH, GENTPEAK, GENTRANDOM, TOBRATROUGH, TOBRAPEAK, TOBRARND, AMIKACINPEAK, AMIKACINTROU, AMIKACIN in the last 168 hours.  Estimated Creatinine Clearance: 37.2 mL/min (A) (by C-G formula based on SCr of 1.49 mg/dL (H)).    Allergies  Allergen Reactions   Other Other (See Comments)    Blood Product Refusal (Jehovah's witness)     Sulfa Antibiotics Rash   Uloric [Febuxostat] Rash    Antimicrobials this admission: 2/13 cefazolin >>   Dose adjustments this admission: No adjustments at this time  Microbiology results: None to report at the time of this note  Thank you for allowing pharmacy to be a part of this patients care.  Vaughan Basta BS, PharmD, BCPS Clinical Pharmacist 07/10/2021 12:58 PM

## 2021-07-10 NOTE — H&P (Signed)
Cardiology Consultation:   Patient ID: Danielle [name] MRN: [id number]; DOB: Jan 06, 1946  Admit date: 07/10/2021 Date of Consult: 07/10/2021  PCP:  Seward Carol, MD   [facility] Providers Cardiologist:  Glori Bickers, MD  Electrophysiologist:  Constance Haw, MD  Sleep Medicine:  Fransico Him, MD       Patient Profile:   Danielle [name] is a 76 y.o. female with a hx of L breast cancer,  chronic combined systolic/diastolic (EF 97-41),  CAD s/p multiple stents in [place] (with h/o LAD stent thrombosis), NYHA Class II ischemic cardiomyopathy (EF 10% at one point), symptomatic bradycardia s/p PPM, HTN, DM, atrial fibrillation, hypothyroidism, and OSA on CPAP, CKD stage 3b.who is being seen 07/10/2021 for the evaluation of pre-operative risk stratification and management at the request of Dr. Roosevelt Locks   History of Present Illness:    Per HPI, Patient is here for recurrence of infection of her right knee. She underwent right total knee arthroplasty with Dr. Mardelle Matte May 13, 2018. She had staph infection of her prosthetic joint 04/14/2021 and underwent irrigation and  debridement.Postoperatively she was treated with IV antibiotics with cefazolin.  Rifampin had been discussed but ultimately infectious disease decided against given interaction with her Eliquis.  She did well overall and was transitioned as an outpatient from the cefazolin to suppression with Keflex.  She states that she ran out of the Keflex approximately a week ago.  She reports over the past 2 to 3 weeks progressively worsening pain and difficulty with ambulation.  Cardiology was consulted for pre-operative risk stratification. She's HDS. Labs notable for crt 1.22 (stable), hgb 10. Her cxray does not show pulmonary edema. EKG shows NSR and old inferior infarct  She is a patient of Dr. Haroldine Laws. She had admission September of 2018 for decompensated CHF. She was diuresed with 12 pound weight loss. Dry weight 215 pounds. She was  last seen in May 25th, 2022. Plan was to continue torsemide 40 mg daily.   Tonight, she denies dyspnea on exertion or chest pain with walking up a flight of stairs or with her daily activities.She denies orthopnea or PND. She has had increased leg swelling.   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[place]) 02/05/2017   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 05/20/2018   Acute kidney failure ([place]) 05/15/2018   Arthritis    "all over" (05/22/2018)   Blood dyscrasia    per pt-has small blood cells-appears as if anemic   Breast cancer, left breast ([place]) 1985   CHF (congestive heart failure) (HCC)    Complication of anesthesia    difficult to awaken from per pt   Coronary artery disease    Dyspnea    Essential hypertension    Heart disease    Heart murmur    History of gout    Hypothyroidism    Obesity (BMI 30-39.9) 11/11/2017   OSA (obstructive sleep apnea) 02/18/2017    severe obstructive sleep apnea with an AHI of 75.6/h and mild central sleep apnea with a CAI of 7.7/h.  Oxygen saturations dropped as low as 82%.   He is on CPAP at 9 cm H2O.   OSA on CPAP    Personal history of chemotherapy    Personal history of radiation therapy    Presence of permanent cardiac pacemaker    Primary localized osteoarthritis of right knee 05/13/2018   Refusal of blood transfusions as patient is Jehovah's Witness  Type II diabetes mellitus [facility])     Past Surgical History:  Procedure Laterality Date   APPLICATION OF WOUND VAC Right 04/14/2021   Procedure: APPLICATION OF WOUND VAC;  Surgeon: Willaim Sheng, MD;  Location: [place];  Service: Orthopedics;  Laterality: Right;   BREAST BIOPSY Left 1985   BREAST LUMPECTOMY Left 1985   BREAST LUMPECTOMY WITH NEEDLE LOCALIZATION AND AXILLARY LYMPH NODE DISSECTION  1985   CATARACT EXTRACTION W/ INTRAOCULAR LENS  IMPLANT, BILATERAL Bilateral    CORONARY ANGIOPLASTY WITH  STENT PLACEMENT  2010, 2012,2017    2 done 2010 and 1 replaced 2012 and 2 replaced in 2017   [place]     I & D KNEE WITH POLY EXCHANGE Right 04/14/2021   Procedure: IRRIGATION AND DEBRIDEMENT KNEE WITH POLY EXCHANGE;  Surgeon: Willaim Sheng, MD;  Location: [place];  Service: Orthopedics;  Laterality: Right;   INSERT / REPLACE / REMOVE PACEMAKER  07/19/2014   JOINT REPLACEMENT     REPLACEMENT TOTAL KNEE Left 09/2012   THYROIDECTOMY, PARTIAL  1978   TONSILLECTOMY     TOTAL KNEE ARTHROPLASTY Right 05/13/2018   Procedure: TOTAL KNEE ARTHROPLASTY;  Surgeon: Marchia Bond, MD;  Location: [facility];  Service: Orthopedics;  Laterality: Right;   TUBAL LIGATION         Inpatient Medications: Scheduled Meds:  acetaminophen  1,000 mg Oral Q6H   atorvastatin  80 mg Oral QHS   Basaglar KwikPen  40 Units Subcutaneous QHS   carvedilol  12.5 mg Oral BID WC   empagliflozin  10 mg Oral Daily   enoxaparin (LOVENOX) injection  40 mg Subcutaneous Q24H   gabapentin  300 mg Oral BID   insulin aspart  0-15 Units Subcutaneous TID WC   insulin aspart  0-5 Units Subcutaneous QHS   [START ON 07/11/2021] levothyroxine  75 mcg Oral QAC breakfast   losartan  25 mg Oral Daily   senna  1 tablet Oral BID   spironolactone  12.5 mg Oral Daily   torsemide  40 mg Oral Daily   Continuous Infusions:  0.9 % NaCl with KCl 20 mEq / L 75 mL/hr at 07/10/21 1817    ceFAZolin (ANCEF) IV 2 g (07/10/21 1818)   methocarbamol (ROBAXIN) IV     PRN Meds: [START ON 07/11/2021] acetaminophen, bisacodyl, diphenhydrAMINE, HYDROmorphone (DILAUDID) injection, methocarbamol **OR** methocarbamol (ROBAXIN) IV, ondansetron **OR** ondansetron (ZOFRAN) IV, oxyCODONE, oxyCODONE, polyethylene glycol, sodium phosphate  Allergies:    Allergies  Allergen Reactions   Other Other (See Comments)    Blood Product Refusal (Jehovah's witness)     Tape Other (See Comments)    Leaves blisters and marks on the skin    Sulfa Antibiotics Rash   Uloric [Febuxostat] Rash    Social History:   Social History   Socioeconomic History   Marital status: Widowed    Spouse name: Not on file   Number of children: 3   Years of education: Not on file   Highest education level: Some college, no degree  Occupational History   Occupation: retired  Tobacco Use   Smoking status: Former    Packs/day: 1.00    Years: 22.00    Pack years: 22.00    Types: Cigarettes    Quit date: 1983    Years since quitting: 40.1   Smokeless tobacco: Never  Vaping Use   Vaping Use: Never used  Substance and Sexual Activity   Alcohol use: Yes    Comment:  05/22/2018 "glass of wine couple times/wk"   Drug use: Not Currently   Sexual activity: Not Currently    Birth control/protection: None, Post-menopausal  Other Topics Concern   Not on file  Social History Narrative   Not on file   Social Determinants of Health   Financial Resource Strain: Not on file  Food Insecurity: Not on file  Transportation Needs: Not on file  Physical Activity: Not on file  Stress: Not on file  Social Connections: Not on file  Intimate Partner Violence: Not on file    Family History:    Family History  Problem Relation Age of Onset   Multiple myeloma Mother    Heart disease Father    Other Sister    Heart disease Brother    Diabetes Sister    Other Sister      ROS:  Please see the history of present illness.   All other ROS reviewed and negative.     Physical Exam/Data:   Vitals:   07/10/21 1238 07/10/21 1758  BP: (!) 162/76 (!) 127/59  Pulse: 75 79  Resp: 18 18  Temp: 98.2 F (36.8 C) 98.2 F (36.8 C)  TempSrc: Oral   SpO2: 100% 95%    Intake/Output Summary (Last 24 hours) at 07/10/2021 1927 Last data filed at 07/10/2021 1818 Gross per 24 hour  Intake 200.45 ml  Output --  Net 200.45 ml   Last 3 Weights 07/03/2021 06/19/2021 06/15/2021  Weight (lbs) 211 lb 213 lb 211 lb 9.6 oz  Weight (kg) 95.709 kg 96.616 kg 95.981  kg     There is no height or weight on file to calculate BMI.  General:  Mild distress HEENT: normal Neck: no JVD Vascular: No carotid bruits; Distal pulses 2+ bilaterally Cardiac:  normal S1, S2; RRR; no murmur  Lungs:  clear to auscultation bilaterally, no wheezing, rhonchi or rales  Abd: soft, nontender, no hepatomegaly  Ext: no edema Musculoskeletal:  No deformities, BUE and BLE strength normal and equal Skin: warm and dry  Neuro:  CNs 2-12 intact, no focal abnormalities noted Psych:  Normal affect   EKG:  The EKG was personally reviewed and demonstrates:    No recent EKG   Relevant CV Studies: Echo 12/21 EF 45-50% G2DD  ECHO 01/2017- EF 45-50% Grade 2DD Left Atrium Severely dilated.  ECHO 12/2017 EF 45-50%, G2DD, LA mildly dilated, trileaflet moderately thick aortic valve  Laboratory Data:  High Sensitivity Troponin:  No results for input(s): TROPONINIHS in the last 720 hours.   Chemistry Recent Labs  Lab 07/10/21 1404  NA 136  K 3.7  CL 97*  CO2 29  GLUCOSE 191*  BUN 21  CREATININE 1.22*  CALCIUM 9.2  GFRNONAA 47*  ANIONGAP 10    Recent Labs  Lab 07/10/21 1404  PROT 7.6  ALBUMIN 3.3*  AST 36  ALT 33  ALKPHOS 70  BILITOT 0.9   Lipids No results for input(s): CHOL, TRIG, HDL, LABVLDL, LDLCALC, CHOLHDL in the last 168 hours.  Hematology Recent Labs  Lab 07/10/21 1404  WBC 9.0  RBC 4.97  HGB 10.3*  HCT 34.3*  MCV 69.0*  MCH 20.7*  MCHC 30.0  RDW 15.9*  PLT 219   Thyroid No results for input(s): TSH, FREET4 in the last 168 hours.  BNPNo results for input(s): BNP, PROBNP in the last 168 hours.  DDimer No results for input(s): DDIMER in the last 168 hours.   Radiology/Studies:  [facility] Chest Port 1 [address]  Result Date: 07/10/2021 CLINICAL DATA:  CHF EXAM: PORTABLE CHEST 1 VIEW COMPARISON:  04/13/2021 FINDINGS: Left pacer remains in place, unchanged. Heart is borderline in size. Aortic atherosclerosis. No confluent opacities, effusions or edema. No  acute bony abnormality. IMPRESSION: No active disease. Electronically Signed   By: Rolm Baptise M.D.   On: 07/10/2021 19:13     Assessment and Plan:   Pre-operative Risk Assessment: She was able to ambulate >4 METS prior to her knee pain. She denied angina or dyspnea on exertion.  RCRI 10%, Class III risk. She is intermediate risk for low-intermediate risk surgery. -acceptable cardiac risk for surgery -no plans for ischemic evaluation prior to surgery -cardiology can continue to follow   1. Chronic systolic CHF: ICM - Echo 70/62 EF 45-50% G2DD ; She has hx of CAD with multiple stents. Inferolateral wall , inferior and inferoseptum are hypokinetic as well as inferior Q waves. She likely has scar in the RCA territory. Stable NYHA II. She has no CHF symptoms, does have some leg edema.  - obtain BNP - continue home torsemide 40 daily - strict Is/Os,  - Continue imdur 30 mg daily - Continue losartan 50 mg daily. - Continue spiro 25 mg daily.  - Continue carvedilol 25 mg twice a day. - Continue Jardiance 10 mg daily.    2. CAD: Stenting in 2010, 2012 and 2016.  - No ss/s ischemia - Continue statin. No ASA with Eliquis.   3. Paroxysmal Afib - no EKG, last EKG shows sinus rhythm 06/15/2021 - Can hold home Eliquis 5 mg BID prior to surgery  - Following in Afib clinic.    4. HTN - Blood pressure well controlled. Continue current regimen. - If goes back up can switch losartan to Entresto    5. DM - Per Dr. Delfina Redwood.  - Continue Jardiance.    6. H/o sinus pauses s/p PPM, medtronic dual chamber Follows with Dr. Curt Bears      Risk Assessment/Risk Scores:    For questions or updates, please contact [facility] Please consult [email or web address] for contact info under    Signed, Janina Mayo, MD  07/10/2021 7:27 PM

## 2021-07-10 NOTE — H&P (Signed)
ORTHOPAEDIC H&P  Chief Complaint: Right knee infection  HPI: Danielle [name] is a 76 y.o. female who had initially undergone right total knee arthroplasty with Dr. Mardelle Matte in May 13, 2018.  She had done well overall initially after surgery without any acute postoperative complications until she presented to Dr. Luanna Cole office in mid November with severe acute pain and swelling of the right knee.  She was at that time admitted to the hospital where aspiration of the right knee was concerning for infection and the patient was diagnosed with a staph infection of her prosthetic knee joint.  She underwent irrigation and debridement with liner exchange on November 18.  Postoperatively she was treated with IV antibiotics with cefazolin.  Rifampin had been discussed but ultimately infectious disease decided against given interaction with her Eliquis.  She did well overall and was transitioned as an outpatient from the cefazolin to suppression with Keflex.  She states that she ran out of the Keflex approximately a week ago.  She reports over the past 2 to 3 weeks progressively worsening pain and difficulty with ambulation.  She also endorses subjective fevers.  She localizes the pain primarily to the right knee at this point.  She denies distal numbness and tingling.  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[place]) 02/05/2017   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 05/20/2018   Acute kidney failure ([place]) 05/15/2018   Arthritis    "all over" (05/22/2018)   Blood dyscrasia    per pt-has small blood cells-appears as if anemic   Breast cancer, left breast ([place]) 1985   CHF (congestive heart failure) (HCC)    Complication of anesthesia    difficult to awaken from per pt   Coronary artery disease    Dyspnea    Essential hypertension    Heart disease    Heart murmur    History of gout    Hypothyroidism    Obesity (BMI  30-39.9) 11/11/2017   OSA (obstructive sleep apnea) 02/18/2017    severe obstructive sleep apnea with an AHI of 75.6/h and mild central sleep apnea with a CAI of 7.7/h.  Oxygen saturations dropped as low as 82%.   He is on CPAP at 9 cm H2O.   OSA on CPAP    Personal history of chemotherapy    Personal history of radiation therapy    Presence of permanent cardiac pacemaker    Primary localized osteoarthritis of right knee 05/13/2018   Refusal of blood transfusions as patient is Jehovah's Witness    Type II diabetes mellitus ([place])    Past Surgical History:  Procedure Laterality Date   APPLICATION OF WOUND VAC Right 04/14/2021   Procedure: APPLICATION OF WOUND VAC;  Surgeon: Willaim Sheng, MD;  Location: [facility];  Service: Orthopedics;  Laterality: Right;   BREAST BIOPSY Left 1985   BREAST LUMPECTOMY Left 1985   BREAST LUMPECTOMY WITH NEEDLE LOCALIZATION AND AXILLARY LYMPH NODE DISSECTION  1985   CATARACT EXTRACTION W/ INTRAOCULAR LENS  IMPLANT, BILATERAL Bilateral    CORONARY ANGIOPLASTY WITH STENT PLACEMENT  2010, 2012,2017    2 done 2010 and 1 replaced 2012 and 2 replaced in 2017   [facility]     I & D KNEE WITH POLY EXCHANGE Right 04/14/2021   Procedure: IRRIGATION AND DEBRIDEMENT KNEE WITH POLY EXCHANGE;  Surgeon: Willaim Sheng, MD;  Location: [facility];  Service: Orthopedics;  Laterality:  Right;   INSERT / REPLACE / REMOVE PACEMAKER  07/19/2014   JOINT REPLACEMENT     REPLACEMENT TOTAL KNEE Left 09/2012   THYROIDECTOMY, PARTIAL  1978   TONSILLECTOMY     TOTAL KNEE ARTHROPLASTY Right 05/13/2018   Procedure: TOTAL KNEE ARTHROPLASTY;  Surgeon: Marchia Bond, MD;  Location: [facility];  Service: Orthopedics;  Laterality: Right;   TUBAL LIGATION     Social History   Socioeconomic History   Marital status: Widowed    Spouse name: Not on file   Number of children: 3   Years of education: Not on file   Highest education level: Some college, no degree   Occupational History   Occupation: retired  Tobacco Use   Smoking status: Former    Packs/day: 1.00    Years: 22.00    Pack years: 22.00    Types: Cigarettes    Quit date: 1983    Years since quitting: 40.1   Smokeless tobacco: Never  Vaping Use   Vaping Use: Never used  Substance and Sexual Activity   Alcohol use: Yes    Comment: 05/22/2018 "glass of wine couple times/wk"   Drug use: Not Currently   Sexual activity: Not Currently    Birth control/protection: None, Post-menopausal  Other Topics Concern   Not on file  Social History Narrative   Not on file   Social Determinants of Health   Financial Resource Strain: Not on file  Food Insecurity: Not on file  Transportation Needs: Not on file  Physical Activity: Not on file  Stress: Not on file  Social Connections: Not on file   Family History  Problem Relation Age of Onset   Multiple myeloma Mother    Heart disease Father    Other Sister    Heart disease Brother    Diabetes Sister    Other Sister    Allergies  Allergen Reactions   Other Other (See Comments)    Blood Product Refusal (Jehovah's witness)     Sulfa Antibiotics Rash   Uloric [Febuxostat] Rash     Positive ROS: All other systems have been reviewed and were otherwise negative with the exception of those mentioned in the HPI and as above.  Physical Exam: General: Alert, no acute distress Cardiovascular: moderate b/l pitting edema R>L up to the knees Respiratory: No cyanosis, no use of accessory musculature Skin: No lesions in the area of chief complaint Neurologic: Sensation intact distally Psychiatric: Patient is competent for consent with normal mood and affect  MUSCULOSKELETAL:  RLE Well healed incision over the anterior aspect of the knee  Moderate R knee effusion No traumatic wounds, ecchymosis, or rash  Diffuse tenderness about the R knee  Pain with any knee ROM  Knee stable to varus/ valgus stress  Sens DPN, SPN, TN intact  Motor  EHL, ext, flex 5/5  Feet wwp   IMAGING: Xrays R knee from clinic show intact total knee arthroplasty without adverse features. no fx, dislocation, orther osseous abnormality.  Assessment: Principal Problem:   Infection of prosthetic right knee joint (HCC) Active Problems:   Complication of internal right knee prosthesis ([place])  R TKA concern for recurrent PJI after I&D and poly exchange on 04/14/2021    Plan: -Will follow up cxs from clinic aspiration, but grossly consistent with purulence and recurrent infection - will start cefazolin abx given hx of prior MSSA PJI. ID consulted for further periop recs. - patient with poorly controlled diabetes (A1c 8.2), CKD, CAD with new worsening  LE edema - will c/s medicine for medical comanagement recs appreciated. -patient tentatively schedule for TKA explant and implantation of abx spacer 2/15. Discussed plan with patient and family for 2 stage revision at this point. Will plan for articulating knee spacer following explant assuming appropriate stability and no significant bone loss. Risks and benefits were also discussed.    Willaim Sheng, MD  Contact information:   GGY[facility] 7am-5pm epic message Dr. Zachery Dakins, or call office for patient follow up: [phone] After hours and holidays please check [email or web address] for group call information for [facility]

## 2021-07-11 ENCOUNTER — Inpatient Hospital Stay (HOSPITAL_COMMUNITY): Payer: Medicare HMO

## 2021-07-11 ENCOUNTER — Encounter (HOSPITAL_COMMUNITY): Payer: Medicare HMO

## 2021-07-11 ENCOUNTER — Other Ambulatory Visit: Payer: Self-pay

## 2021-07-11 DIAGNOSIS — M7989 Other specified soft tissue disorders: Secondary | ICD-10-CM | POA: Diagnosis not present

## 2021-07-11 DIAGNOSIS — I5032 Chronic diastolic (congestive) heart failure: Secondary | ICD-10-CM | POA: Diagnosis not present

## 2021-07-11 DIAGNOSIS — T8453XA Infection and inflammatory reaction due to internal right knee prosthesis, initial encounter: Secondary | ICD-10-CM | POA: Diagnosis not present

## 2021-07-11 LAB — SURGICAL PCR SCREEN
MRSA, PCR: NEGATIVE
Staphylococcus aureus: NEGATIVE

## 2021-07-11 LAB — GLUCOSE, CAPILLARY
Glucose-Capillary: 111 mg/dL — ABNORMAL HIGH (ref 70–99)
Glucose-Capillary: 100 mg/dL — ABNORMAL HIGH (ref 70–99)
Glucose-Capillary: 130 mg/dL — ABNORMAL HIGH (ref 70–99)
Glucose-Capillary: 86 mg/dL (ref 70–99)

## 2021-07-11 LAB — BRAIN NATRIURETIC PEPTIDE: B Natriuretic Peptide: 258 pg/mL — ABNORMAL HIGH (ref 0.0–100.0)

## 2021-07-11 LAB — SARS CORONAVIRUS 2 (TAT 6-24 HRS): SARS Coronavirus 2: NEGATIVE

## 2021-07-11 NOTE — Progress Notes (Signed)
PROGRESS NOTE  Danielle [name]  DOB: 12/03/1945  PCP: Seward Carol, MD ATF:[id number]  DOA: 07/10/2021  LOS: 1 day  Hospital Day: 2  Brief narrative: Danielle [name] is a 76 y.o. female with PMH significant for obesity, OSA on CPAP, DM2, HTN, CAD with stents combined systolic and diastolic CHF, CKD 3A, left breast cancer, gout, Jehovah's Witness refusing blood transfusions. December 2019, patient underwent right knee arthroplasty by Dr. Mardelle Matte  November 2022, patient was seen in his office for severe acute pain and swelling of the right knee.  Right knee aspiration at that time was concerning for staph infection of her prosthetic knee joint.  She underwent I&D with liner exchange and was treated postoperatively with IV antibiotics.  She did well overall and was transitioned as an outpatient from the cefazolin to suppression with Keflex.  Evidently she ran out of Keflex approximately a week ago.  Also for the past 2 to 3 weeks, patient was getting progressively worsening pain and difficulty with ambulation. On 2/13, she was admitted again for prosthetic joint infection under orthopedics service. Hospital service was consulted for comanagement of medical issues. Labs on admission showed WBC count at 9, CRP elevated to 28, sed rate elevated to 64, creatinine at 1.22 which is better than in January.   Subjective: Patient was seen and examined this morning.  Pleasant elderly African-American female.  Not in distress. 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  Noted a plan of OR on Wednesday.  Uncontrolled type 2 diabetes mellitus -A1c 8.2 on 07/10/2021 -Home meds include Lantus 30 units, sliding scale insulin -Currently on Semglee 40 units daily and sliding scale insulin -Continue Neurontin for peripheral neuropathy Recent Labs  Lab 07/10/21 1326 07/10/21 1537 07/10/21 2126 07/11/21 0819 07/11/21 1218  GLUCAP 194* 194* 192* 111* 100*   CAD/stents -Had a stent 3 times in the past, last in 2017. -Currently not having anginal symptoms. -It seems cardiology was consulted for preop evaluation. -Continue Lipitor.  Eliquis on hold  Chronic combined systolic and diastolic CHF Essential hypertension -Last echo from December 2021 with EF 45 to 27%, grade 2 diastolic dysfunction, mildly elevated pulmonary artery systolic pressure -Home meds include Coreg 12.5 mg twice daily, torsemide 20 mg daily, losartan 25 mg daily and Aldactone 12.5 mg daily. -Currently blood pressure is normal and creatinine is at baseline.   -Currently continued on Coreg, torsemide, losartan and Aldactone.  Continue to monitor blood pressure and renal function perioperatively.  PAF -Eliquis on hold for incoming surgery.  Right leg swelling -Probably related to septic arthritis itself.  PTA, patient was already on Eliquis. -DVT scan pending.   CKD stage IIIa -Creatinine level stable continue losartan and torsemide. Recent Labs    04/14/21 0551 04/15/21 0215 04/16/21 0758 04/17/21 0343 04/18/21 0222 04/19/21 0205 04/21/21 0250 04/24/21 0229 06/22/21 1243 07/10/21 1404  BUN 32* 36* 40* 42*  33* 30* 24* 18 35* 21  CREATININE 1.72* 1.86* 1.68* 1.46* 1.25* 1.27* 1.23* 1.17* 1.49* 1.22*   Hypothyroidism -Synthroid  Morbid obesity  -There is  no height or weight on file to calculate BMI. Patient has been advised to make an attempt to improve diet and exercise patterns to aid in weight loss.  OSA -CPAP nightly  Anemia of chronic disease -Hemoglobin at baseline seems to be between 8 and 9.  Currently over 10.  Continue to monitor.  Patient is a Jehovah witness and refuses blood transfusion. Recent Labs    04/18/21 0222 04/19/21 0205 04/21/21 0250 04/23/21 1637 04/24/21 0229 07/10/21 1404  HGB 8.4* 8.6* 8.5*  --  7.0* 10.3*  MCV 69.4* 69.2* 67.9*  --  69.2* 69.0*  FERRITIN  --   --   --  464*  --   --   TIBC  --   --   --  221*  --   --   IRON  --   --   --  23*  --   --   RETICCTPCT  --   --   --  1.4  --   --     Mobility: PT eval postprocedure Goals of care   Code Status: Full Code    Nutritional status:  There is no height or weight on file to calculate BMI.      Diet:  Diet Order             Diet Heart Room service appropriate? Yes; Fluid consistency: Thin  Diet effective now                   DVT prophylaxis:  enoxaparin (LOVENOX) injection 40 mg Start: 07/10/21 2100 SCDs Start: 07/10/21 1254   Antimicrobials: IV Ancef Fluid: None Family Communication: None at bedside  Status is: Inpatient  Continue in-hospital care because: Pending OR Level of care: Med-Surg   Dispo: The patient is from: Home              Anticipated d/c is to: Pending clinical course              Patient currently is not medically stable to d/c.   Difficult to place patient No     Infusions:    ceFAZolin (ANCEF) IV 200 mL/hr at 07/11/21 0641   methocarbamol (ROBAXIN) IV      Scheduled Meds:  atorvastatin  80 mg Oral QHS   carvedilol  12.5 mg Oral BID WC   empagliflozin  10 mg Oral Daily   enoxaparin (LOVENOX) injection  40 mg Subcutaneous Q24H   gabapentin  300 mg Oral BID   insulin aspart  0-15 Units Subcutaneous TID WC   insulin aspart  0-5 Units Subcutaneous QHS   insulin glargine-yfgn  40 Units  Subcutaneous QHS   levothyroxine  75 mcg Oral Q0600   losartan  25 mg Oral Daily   senna  1 tablet Oral BID   spironolactone  12.5 mg Oral Daily   torsemide  40 mg Oral Daily    PRN meds: acetaminophen, bisacodyl, diphenhydrAMINE, HYDROmorphone (DILAUDID) injection, methocarbamol **OR** methocarbamol (ROBAXIN) IV, ondansetron **OR** ondansetron (ZOFRAN) IV, oxyCODONE, oxyCODONE, polyethylene glycol, sodium phosphate   Antimicrobials: Anti-infectives (From admission, onward)    Start     Dose/Rate Route Frequency Ordered Stop   07/10/21 1400  ceFAZolin (ANCEF) IVPB 2g/100 mL premix        2 g 200 mL/hr over 30 Minutes Intravenous Every 8  hours 07/10/21 1258         Objective: Vitals:   07/11/21 0619 07/11/21 0819  BP: 125/75 139/64  Pulse: 73 72  Resp: 16 19  Temp: 98.6 F (37 C)   SpO2: 100% 98%    Intake/Output Summary (Last 24 hours) at 07/11/2021 1504 Last data filed at 07/11/2021 1029 Gross per 24 hour  Intake 640.45 ml  Output --  Net 640.45 ml   There were no vitals filed for this visit. Weight change:  There is no height or weight on file to calculate BMI.   Physical Exam: General exam: Pleasant, elderly African-American female. Skin: No rashes, lesions or ulcers. HEENT: Atraumatic, normocephalic, no obvious bleeding Lungs: Clear to auscultation bilaterally CVS: Regular rate and rhythm, no murmur GI/Abd soft, nontender, nondistended, bowel sound present CNS: Alert, awake, oriented times 3 Psychiatry: Mood appropriate Extremities: Pedal edema trace bilaterally, right more than left  Data Review: I have personally reviewed the laboratory data and studies available.  F/u labs ordered Unresulted Labs (From admission, onward)     Start     Ordered   Unscheduled  Basic metabolic panel  Daily,   R      07/11/21 1504   Unscheduled  CBC with Differential/Platelet  Daily,   R      07/11/21 1504   Signed and Held  SARS CORONAVIRUS 2 (TAT 6-24 HRS)  Nasopharyngeal Nasopharyngeal Swab  (COVID Labs)  Once,   R        Signed and Held   Signed and Held  Surgical pcr screen  Once,   R        Signed and Held            Signed, Terrilee Croak, MD [facility] 07/11/2021

## 2021-07-11 NOTE — Progress Notes (Addendum)
Progress Note  Patient Name: Danielle [name] Date of Encounter: 07/11/2021  [facility] Cardiologist: Glori Bickers, MD   Subjective   Patient denies chest pain, palpitations, SOB. Is frustrated with how many people have been seeing her this admission. Patient did not have any questions at this time.   Inpatient Medications    Scheduled Meds:  acetaminophen  1,000 mg Oral Q6H   atorvastatin  80 mg Oral QHS   carvedilol  12.5 mg Oral BID WC   empagliflozin  10 mg Oral Daily   enoxaparin (LOVENOX) injection  40 mg Subcutaneous Q24H   gabapentin  300 mg Oral BID   insulin aspart  0-15 Units Subcutaneous TID WC   insulin aspart  0-5 Units Subcutaneous QHS   insulin glargine-yfgn  40 Units Subcutaneous QHS   levothyroxine  75 mcg Oral Q0600   losartan  25 mg Oral Daily   senna  1 tablet Oral BID   spironolactone  12.5 mg Oral Daily   torsemide  40 mg Oral Daily   Continuous Infusions:   ceFAZolin (ANCEF) IV 200 mL/hr at 07/11/21 0641   methocarbamol (ROBAXIN) IV     PRN Meds: acetaminophen, bisacodyl, diphenhydrAMINE, HYDROmorphone (DILAUDID) injection, methocarbamol **OR** methocarbamol (ROBAXIN) IV, ondansetron **OR** ondansetron (ZOFRAN) IV, oxyCODONE, oxyCODONE, polyethylene glycol, sodium phosphate   Vital Signs    Vitals:   07/10/21 1758 07/10/21 2119 07/11/21 0619 07/11/21 0819  BP: (!) 127/59 (!) 132/51 125/75 139/64  Pulse: 79 70 73 72  Resp: 18 18 16 19   Temp: 98.2 F (36.8 C) 98.7 F (37.1 C) 98.6 F (37 C)   TempSrc:  Oral Oral   SpO2: 95% 98% 100% 98%    Intake/Output Summary (Last 24 hours) at 07/11/2021 1032 Last data filed at 07/11/2021 0641 Gross per 24 hour  Intake 400.45 ml  Output --  Net 400.45 ml   Last 3 Weights 07/03/2021 06/19/2021 06/15/2021  Weight (lbs) 211 lb 213 lb 211 lb 9.6 oz  Weight (kg) 95.709 kg 96.616 kg 95.981 kg      Telemetry    Patient not on telemetry at this time - Personally Reviewed  ECG    No new tracings  this admission  - Personally Reviewed  Physical Exam   GEN: No acute distress.   Neck: No JVD Cardiac: RRR. Grade 1/6 systolic murmur at apex, no gallops or rubs Vascular: Radial pulses 2+ bilaterally and regular  Respiratory: Clear to auscultation bilaterally, no increased work of breathing.  GI: Soft, nontender, non-distended  MS: Trace pedal edema, worse on the right than the left Neuro:  Nonfocal  Psych: Normal affect   Labs    High Sensitivity Troponin:  No results for input(s): TROPONINIHS in the last 720 hours.   Chemistry Recent Labs  Lab 07/10/21 1404  NA 136  K 3.7  CL 97*  CO2 29  GLUCOSE 191*  BUN 21  CREATININE 1.22*  CALCIUM 9.2  PROT 7.6  ALBUMIN 3.3*  AST 36  ALT 33  ALKPHOS 70  BILITOT 0.9  GFRNONAA 47*  ANIONGAP 10    Lipids No results for input(s): CHOL, TRIG, HDL, LABVLDL, LDLCALC, CHOLHDL in the last 168 hours.  Hematology Recent Labs  Lab 07/10/21 1404  WBC 9.0  RBC 4.97  HGB 10.3*  HCT 34.3*  MCV 69.0*  MCH 20.7*  MCHC 30.0  RDW 15.9*  PLT 219   Thyroid No results for input(s): TSH, FREET4 in the last 168 hours.  BNPNo results  for input(s): BNP, PROBNP in the last 168 hours.  DDimer No results for input(s): DDIMER in the last 168 hours.   Radiology    DG Chest Port 1 View  Result Date: 07/10/2021 CLINICAL DATA:  CHF EXAM: PORTABLE CHEST 1 VIEW COMPARISON:  04/13/2021 FINDINGS: Left pacer remains in place, unchanged. Heart is borderline in size. Aortic atherosclerosis. No confluent opacities, effusions or edema. No acute bony abnormality. IMPRESSION: No active disease. Electronically Signed   By: Rolm Baptise M.D.   On: 07/10/2021 19:13    Cardiac Studies   Echocardiogram 04/29/2020   1. Left ventricular ejection fraction, by estimation, is 45 to 50%. The  left ventricle has mildly decreased function. The left ventricle  demonstrates regional wall motion abnormalities (see scoring  diagram/findings for description). There is  mild  concentric left ventricular hypertrophy. Left ventricular diastolic  parameters are consistent with Grade II diastolic dysfunction  (pseudonormalization). Elevated left atrial pressure. There is mild  hypokinesis of the left ventricular, apical inferior  wall, anterior wall and septal wall.   2. Right ventricular systolic function is normal. The right ventricular  size is normal. There is mildly elevated pulmonary artery systolic  pressure.   3. Left atrial size was severely dilated.   4. The mitral valve is degenerative. Mild to moderate mitral valve  regurgitation. Mild mitral stenosis. The mean mitral valve gradient is 4.0  mmHg. Moderate mitral annular calcification.   5. The aortic valve is tricuspid. There is mild calcification of the  aortic valve. There is mild thickening of the aortic valve. Aortic valve  regurgitation is mild. Mild aortic valve sclerosis is present, with no  evidence of aortic valve stenosis.   6. The inferior vena cava is dilated in size with <50% respiratory  variability, suggesting right atrial pressure of 15 mmHg.   Comparison(s): Prior images reviewed side by side. The left ventricular  function is unchanged. The left ventricular wall motion abnormality is  unchanged. There is very mild mitral stenosis (not meaningfully changed  from 2019). there is evidence of higher   right and left atrial filling pressures (hypervolemia) compared to 2019.   Patient Profile     76 y.o. female with a hx of L breast cancer,  chronic combined systolic/diastolic (EF 40-98),  CAD s/p multiple stents in [place] (with h/o LAD stent thrombosis), NYHA Class II ischemic cardiomyopathy (EF 10% at one point), symptomatic bradycardia s/p PPM, HTN, DM, atrial fibrillation, hypothyroidism, and OSA on CPAP, CKD stage 3b.who was seen 07/10/2021 for the evaluation of pre-operative risk stratification and management at the request of Dr. Roosevelt Locks  Assessment & Plan   Preoperative Risk  Assessment  - Per Dr. Nelly Laurence evaluation on 2/13, patient is a class III/intermediate risk. - Acceptable cardiac risk for surgery  - No plans for ischemic evaluation prior to surgery    Chronic systolic CHF: ICM  - Most recent echo from 04/2020 showed EF 45-50%, Grade II diastolic dysfunction. Patient has a history of CAD with multiple stents  - No SOB  - Some leg edema, worse in right. Possibly related to infected R prosthetic knee joint. Hospitalist service ordered DVT study  - Continue home torsemide, Imdur, losartan, spironolactone, carvedilol, and jardiance - Strict Is/Os  - Obtain BNP   CAD: Stenting in 2010, 2012, and 2016  - Denies chest pain, SOB  - Continue statin, BB. Not on ASA due to eliquis use   Paroxysmal Afib  - Patient is not attached to telemetry. Normal  cardiac rate and rhythm on physical exam.  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 Curt Bears   For questions or updates, please contact [place] Please consult [email or web address] for contact info under        Signed, Margie Billet, PA-C  07/11/2021, 10:32 AM    I have seen and examined the patient along with Margie Billet, PA-C .  I have reviewed the chart, notes and new data.  I agree with PA/NP's note.  Multiple complex cardiac issues, all currently well compensated   PLAN: [facility] will sign off.   Medication Recommendations:  Hold Eliquis for knee surgery, continue all cardiac meds as currently prescribed. In particular avoid discontinuation of  carvedilol. Restart Eliquis as soon as safe from the surgical point of view Other recommendations (labs, testing, etc):  please reconsult as needed for any postop CV issues. Follow up as an outpatient:  when approaching DC, please let us know so we can schedule follow up   Sanda Klein, MD, [facility] [phone] 07/11/2021, 12:29 PM

## 2021-07-11 NOTE — Progress Notes (Signed)
Mobility Specialist Progress Note    07/11/21 1525  Mobility  Activity Refused mobility   No reason stated, pt seemed annoyed.   [facility] Mobility Specialist  M.S. 5N: [phone]

## 2021-07-11 NOTE — Progress Notes (Signed)
Lower extremity venous has been completed.   Preliminary results in CV Proc.   Danielle [name] 07/11/2021 3:56 PM

## 2021-07-11 NOTE — Progress Notes (Signed)
° ° ° °  Subjective:  Patient reports pain as better controlled this morning. Lying comfortably in bed. Denies distal n/t. Discussed again plan for surgery tomorrow.  Objective:   VITALS:   Vitals:   07/10/21 1238 07/10/21 1758 07/10/21 2119 07/11/21 0619  BP: (!) 162/76 (!) 127/59 (!) 132/51 125/75  Pulse: 75 79 70 73  Resp: 18 18 18 16   Temp: 98.2 F (36.8 C) 98.2 F (36.8 C) 98.7 F (37.1 C) 98.6 F (37 C)  TempSrc: Oral  Oral Oral  SpO2: 100% 95% 98% 100%    Knee remains swollen, incision well healed. Discomfort with gentle ROM. No pain or swelling in other joints.  Moderate b/l distal pitting edema R>L.  Lab Results  Component Value Date   WBC 9.0 07/10/2021   HGB 10.3 (L) 07/10/2021   HCT 34.3 (L) 07/10/2021   MCV 69.0 (L) 07/10/2021   PLT 219 07/10/2021   BMET    Component Value Date/Time   NA 136 07/10/2021 1404   K 3.7 07/10/2021 1404   CL 97 (L) 07/10/2021 1404   CO2 29 07/10/2021 1404   GLUCOSE 191 (H) 07/10/2021 1404   BUN 21 07/10/2021 1404   CREATININE 1.22 (H) 07/10/2021 1404   CALCIUM 9.2 07/10/2021 1404   GFRNONAA 47 (L) 07/10/2021 1404    Assessment/Plan:     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)   Type 2 diabetes mellitus with diabetic polyneuropathy, with long-term current use of insulin (HCC)   Septic arthritis (HCC)   Paroxysmal atrial fibrillation (HCC)   CKD (chronic kidney disease), stage III (HCC)   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 Will sent cxs intraop. - Med and cardiology c/s for periop optimization- recs appreciated. patient with poorly controlled diabetes (A1c 8.2), CKD, CAD with new worsening LE edema -patient tentatively schedule for TKA explant and implantation of abx spacer 2/15. Discussed plan with patient and family for 2 stage revision at this point. Will plan for articulating knee spacer following explant assuming appropriate stability and no significant bone loss. Risks and benefits were also discussed. - Continue to hold eliquis until after surgery.    Danielle [name] A Danielle [name] 07/11/2021, 8:04 AM   Charlies Constable, MD  Contact information:   [phone] 7am-5pm epic message Dr. Zachery Dakins, or call office for patient follow up: [phone] After hours and holidays please check [email or web address] for group call information for [facility]

## 2021-07-12 ENCOUNTER — Encounter (HOSPITAL_COMMUNITY)
Admission: AD | Disposition: A | Discharge status: Skilled Nursing Facility | Payer: Self-pay | Source: Home / Self Care | Attending: Internal Medicine

## 2021-07-12 ENCOUNTER — Inpatient Hospital Stay (HOSPITAL_COMMUNITY): Payer: Medicare HMO

## 2021-07-12 ENCOUNTER — Encounter (HOSPITAL_COMMUNITY): Payer: Self-pay | Admitting: Orthopedic Surgery

## 2021-07-12 ENCOUNTER — Inpatient Hospital Stay (HOSPITAL_COMMUNITY): Payer: Medicare HMO | Admitting: Anesthesiology

## 2021-07-12 DIAGNOSIS — I110 Hypertensive heart disease with heart failure: Secondary | ICD-10-CM

## 2021-07-12 DIAGNOSIS — I509 Heart failure, unspecified: Secondary | ICD-10-CM

## 2021-07-12 DIAGNOSIS — T8453XA Infection and inflammatory reaction due to internal right knee prosthesis, initial encounter: Secondary | ICD-10-CM | POA: Diagnosis not present

## 2021-07-12 DIAGNOSIS — I2510 Atherosclerotic heart disease of native coronary artery without angina pectoris: Secondary | ICD-10-CM

## 2021-07-12 HISTORY — PX: EXCISIONAL TOTAL KNEE ARTHROPLASTY WITH ANTIBIOTIC SPACERS: SHX5827

## 2021-07-12 LAB — BASIC METABOLIC PANEL
Anion gap: 10 (ref 5–15)
BUN: 19 mg/dL (ref 8–23)
CO2: 28 mmol/L (ref 22–32)
Calcium: 8.7 mg/dL — ABNORMAL LOW (ref 8.9–10.3)
Chloride: 99 mmol/L (ref 98–111)
Creatinine, Ser: 1.36 mg/dL — ABNORMAL HIGH (ref 0.44–1.00)
GFR, Estimated: 41 mL/min — ABNORMAL LOW (ref >60)
Glucose, Bld: 116 mg/dL — ABNORMAL HIGH (ref 70–99)
Potassium: 3.9 mmol/L (ref 3.5–5.1)
Sodium: 137 mmol/L (ref 135–145)

## 2021-07-12 LAB — CBC WITH DIFFERENTIAL/PLATELET
Abs Immature Granulocytes: 0.02 10*3/uL (ref 0.00–0.07)
Basophils Absolute: 0 10*3/uL (ref 0.0–0.1)
Basophils Relative: 0 %
Eosinophils Absolute: 0 10*3/uL (ref 0.0–0.5)
Eosinophils Relative: 0 %
HCT: 30.1 % — ABNORMAL LOW (ref 36.0–46.0)
Hemoglobin: 9.2 g/dL — ABNORMAL LOW (ref 12.0–15.0)
Immature Granulocytes: 0 %
Lymphocytes Relative: 31 %
Lymphs Abs: 2.7 10*3/uL (ref 0.7–4.0)
MCH: 20.8 pg — ABNORMAL LOW (ref 26.0–34.0)
MCHC: 30.6 g/dL (ref 30.0–36.0)
MCV: 67.9 fL — ABNORMAL LOW (ref 80.0–100.0)
Monocytes Absolute: 0.9 10*3/uL (ref 0.1–1.0)
Monocytes Relative: 10 %
Neutro Abs: 5.1 10*3/uL (ref 1.7–7.7)
Neutrophils Relative %: 59 %
Platelets: 230 10*3/uL (ref 150–400)
RBC: 4.43 MIL/uL (ref 3.87–5.11)
RDW: 15.7 % — ABNORMAL HIGH (ref 11.5–15.5)
WBC: 8.7 10*3/uL (ref 4.0–10.5)
nRBC: 0 % (ref 0.0–0.2)

## 2021-07-12 LAB — NO BLOOD PRODUCTS

## 2021-07-12 LAB — GLUCOSE, CAPILLARY
Glucose-Capillary: 119 mg/dL — ABNORMAL HIGH (ref 70–99)
Glucose-Capillary: 126 mg/dL — ABNORMAL HIGH (ref 70–99)
Glucose-Capillary: 124 mg/dL — ABNORMAL HIGH (ref 70–99)
Glucose-Capillary: 138 mg/dL — ABNORMAL HIGH (ref 70–99)
Glucose-Capillary: 207 mg/dL — ABNORMAL HIGH (ref 70–99)

## 2021-07-12 SURGERY — REMOVAL, TOTAL ARTHROPLASTY HARDWARE, KNEE, WITH ANTIBIOTIC SPACER INSERTION
Anesthesia: General | Site: Knee | Laterality: Right

## 2021-07-12 SURGERY — IRRIGATION AND DEBRIDEMENT KNEE WITH POLY EXCHANGE
Anesthesia: Choice | Site: Knee | Laterality: Right

## 2021-07-12 MED ORDER — SUGAMMADEX SODIUM 200 MG/2ML IV SOLN
INTRAVENOUS | Status: DC | PRN
  Administered 2021-07-12: 200 mg via INTRAVENOUS

## 2021-07-12 MED ORDER — ONDANSETRON HCL 4 MG/2ML IJ SOLN: 4.0000 mg | Freq: Once | INTRAMUSCULAR | Status: DC | PRN

## 2021-07-12 MED ORDER — FENTANYL CITRATE (PF) 100 MCG/2ML IJ SOLN
INTRAMUSCULAR | Status: AC
Start: 2021-07-12 — End: 2021-07-13
  Filled 2021-07-12: qty 2

## 2021-07-12 MED ORDER — OXYCODONE HCL 5 MG/5ML PO SOLN: 5.0000 mg | Freq: Once | ORAL | Status: DC | PRN

## 2021-07-12 MED ORDER — NOREPINEPHRINE 4 MG/250ML-% IV SOLN
2.0000 ug/min | INTRAVENOUS | Status: DC
  Administered 2021-07-13: 4 ug/min via INTRAVENOUS
  Filled 2021-07-12 (×2): qty 250

## 2021-07-12 MED ORDER — FENTANYL CITRATE (PF) 250 MCG/5ML IJ SOLN
INTRAMUSCULAR | Status: DC | PRN
  Administered 2021-07-12 (×2): 50 ug via INTRAVENOUS

## 2021-07-12 MED ORDER — VASOPRESSIN 20 UNIT/ML IV SOLN
INTRAVENOUS | Status: DC | PRN
  Administered 2021-07-12 (×5): 1 [IU] via INTRAVENOUS

## 2021-07-12 MED ORDER — PROPOFOL 10 MG/ML IV BOLUS
INTRAVENOUS | Status: DC | PRN
  Administered 2021-07-12: 30 mg via INTRAVENOUS
  Administered 2021-07-12: 100 mg via INTRAVENOUS

## 2021-07-12 MED ORDER — FENTANYL CITRATE (PF) 100 MCG/2ML IJ SOLN: 100.0000 ug | Freq: Once | INTRAMUSCULAR | Status: AC

## 2021-07-12 MED ORDER — WATER FOR IRRIGATION, STERILE IR SOLN
Status: DC | PRN
  Administered 2021-07-12: 1000 mL

## 2021-07-12 MED ORDER — OXYCODONE HCL 5 MG PO TABS: 5.0000 mg | ORAL_TABLET | Freq: Once | ORAL | Status: DC | PRN

## 2021-07-12 MED ORDER — CEFAZOLIN SODIUM 1 G IJ SOLR
INTRAMUSCULAR | Status: AC
  Filled 2021-07-12: qty 20

## 2021-07-12 MED ORDER — TOBRAMYCIN SULFATE 1.2 G IJ SOLR
INTRAMUSCULAR | Status: AC
  Filled 2021-07-12: qty 1.2

## 2021-07-12 MED ORDER — ONDANSETRON HCL 4 MG/2ML IJ SOLN
INTRAMUSCULAR | Status: AC
  Filled 2021-07-12: qty 2

## 2021-07-12 MED ORDER — ROCURONIUM BROMIDE 10 MG/ML (PF) SYRINGE
PREFILLED_SYRINGE | INTRAVENOUS | Status: AC
  Filled 2021-07-12: qty 10

## 2021-07-12 MED ORDER — VANCOMYCIN HCL IN DEXTROSE 1-5 GM/200ML-% IV SOLN
INTRAVENOUS | Status: AC
  Filled 2021-07-12: qty 200

## 2021-07-12 MED ORDER — SODIUM CHLORIDE 0.9 % IV SOLN
0.0000 ug/min | INTRAVENOUS | Status: DC
  Administered 2021-07-12: 40 ug/min via INTRAVENOUS
  Filled 2021-07-12: qty 2

## 2021-07-12 MED ORDER — SODIUM CHLORIDE 0.9 % IR SOLN
Status: DC | PRN
  Administered 2021-07-12 (×2): 3000 mL

## 2021-07-12 MED ORDER — LIDOCAINE 2% (20 MG/ML) 5 ML SYRINGE
INTRAMUSCULAR | Status: DC | PRN
  Administered 2021-07-12: 100 mg via INTRAVENOUS

## 2021-07-12 MED ORDER — DEXAMETHASONE SODIUM PHOSPHATE 10 MG/ML IJ SOLN
INTRAMUSCULAR | Status: DC | PRN
  Administered 2021-07-12: 5 mg via INTRAVENOUS

## 2021-07-12 MED ORDER — VANCOMYCIN HCL IN DEXTROSE 1-5 GM/200ML-% IV SOLN
1000.0000 mg | Freq: Once | INTRAVENOUS | Status: AC
  Administered 2021-07-12: 1000 mg via INTRAVENOUS

## 2021-07-12 MED ORDER — ONDANSETRON HCL 4 MG/2ML IJ SOLN
INTRAMUSCULAR | Status: DC | PRN
  Administered 2021-07-12: 4 mg via INTRAVENOUS

## 2021-07-12 MED ORDER — SODIUM CHLORIDE 0.9 % IV SOLN
250.0000 mL | INTRAVENOUS | Status: DC
  Administered 2021-07-13 – 2021-07-29 (×2): 250 mL via INTRAVENOUS

## 2021-07-12 MED ORDER — VANCOMYCIN HCL 1000 MG IV SOLR
INTRAVENOUS | Status: DC | PRN
  Administered 2021-07-12: 6 g

## 2021-07-12 MED ORDER — ROCURONIUM BROMIDE 10 MG/ML (PF) SYRINGE
PREFILLED_SYRINGE | INTRAVENOUS | Status: DC | PRN
  Administered 2021-07-12: 50 mg via INTRAVENOUS
  Administered 2021-07-12: 30 mg via INTRAVENOUS

## 2021-07-12 MED ORDER — CEFAZOLIN SODIUM-DEXTROSE 2-3 GM-%(50ML) IV SOLR
INTRAVENOUS | Status: DC | PRN
  Administered 2021-07-12: 2 g via INTRAVENOUS

## 2021-07-12 MED ORDER — FENTANYL CITRATE (PF) 100 MCG/2ML IJ SOLN
25.0000 ug | INTRAMUSCULAR | Status: AC | PRN
  Administered 2021-07-12 (×6): 25 ug via INTRAVENOUS

## 2021-07-12 MED ORDER — FENTANYL CITRATE (PF) 100 MCG/2ML IJ SOLN
INTRAMUSCULAR | Status: AC
  Filled 2021-07-12: qty 2

## 2021-07-12 MED ORDER — ALBUMIN HUMAN 25 % IV SOLN
25.0000 g | Freq: Four times a day (QID) | INTRAVENOUS | Status: AC
  Administered 2021-07-13 (×4): 25 g via INTRAVENOUS
  Filled 2021-07-12 (×4): qty 100

## 2021-07-12 MED ORDER — PHENYLEPHRINE HCL-NACL 20-0.9 MG/250ML-% IV SOLN
INTRAVENOUS | Status: DC | PRN
  Administered 2021-07-12: 20 ug/min via INTRAVENOUS

## 2021-07-12 MED ORDER — VANCOMYCIN HCL 1000 MG IV SOLR
INTRAVENOUS | Status: AC
  Filled 2021-07-12: qty 120

## 2021-07-12 MED ORDER — FENTANYL CITRATE (PF) 100 MCG/2ML IJ SOLN
INTRAMUSCULAR | Status: AC
  Administered 2021-07-12: 100 ug via INTRAVENOUS
  Filled 2021-07-12: qty 2

## 2021-07-12 MED ORDER — CHLORHEXIDINE GLUCONATE 0.12 % MT SOLN
15.0000 mL | OROMUCOSAL | Status: AC
  Administered 2021-07-12: 15 mL via OROMUCOSAL
  Filled 2021-07-12 (×2): qty 15

## 2021-07-12 MED ORDER — DEXAMETHASONE SODIUM PHOSPHATE 10 MG/ML IJ SOLN
INTRAMUSCULAR | Status: AC
  Filled 2021-07-12: qty 1

## 2021-07-12 MED ORDER — LIDOCAINE 2% (20 MG/ML) 5 ML SYRINGE
INTRAMUSCULAR | Status: AC
  Filled 2021-07-12: qty 5

## 2021-07-12 MED ORDER — TOBRAMYCIN SULFATE 1.2 G IJ SOLR
INTRAMUSCULAR | Status: AC
  Filled 2021-07-12: qty 3.6

## 2021-07-12 MED ORDER — ALBUMIN HUMAN 5 % IV SOLN
INTRAVENOUS | Status: AC
  Administered 2021-07-12: 12.5 g
  Filled 2021-07-12: qty 250

## 2021-07-12 MED ORDER — ALBUMIN HUMAN 5 % IV SOLN: 12.5000 g | Freq: Once | INTRAVENOUS | Status: DC

## 2021-07-12 MED ORDER — LACTATED RINGERS IV SOLN: INTRAVENOUS | Status: DC

## 2021-07-12 MED ORDER — PHENYLEPHRINE 40 MCG/ML (10ML) SYRINGE FOR IV PUSH (FOR BLOOD PRESSURE SUPPORT)
PREFILLED_SYRINGE | INTRAVENOUS | Status: DC | PRN
  Administered 2021-07-12: 40 ug via INTRAVENOUS

## 2021-07-12 MED ORDER — SODIUM CHLORIDE 0.9 % IV BOLUS
500.0000 mL | Freq: Once | INTRAVENOUS | Status: AC
  Administered 2021-07-12: 500 mL via INTRAVENOUS

## 2021-07-12 MED ORDER — PHENYLEPHRINE 40 MCG/ML (10ML) SYRINGE FOR IV PUSH (FOR BLOOD PRESSURE SUPPORT)
PREFILLED_SYRINGE | INTRAVENOUS | Status: AC
  Filled 2021-07-12: qty 10

## 2021-07-12 MED ORDER — SUCCINYLCHOLINE CHLORIDE 200 MG/10ML IV SOSY
PREFILLED_SYRINGE | INTRAVENOUS | Status: DC | PRN
Start: 2021-07-12 — End: 2021-07-12
  Administered 2021-07-12: 140 mg via INTRAVENOUS

## 2021-07-12 MED ORDER — PROPOFOL 10 MG/ML IV BOLUS
INTRAVENOUS | Status: AC
  Filled 2021-07-12: qty 20

## 2021-07-12 MED ORDER — 0.9 % SODIUM CHLORIDE (POUR BTL) OPTIME
TOPICAL | Status: DC | PRN
  Administered 2021-07-12: 1000 mL

## 2021-07-12 MED ORDER — FENTANYL CITRATE (PF) 250 MCG/5ML IJ SOLN
INTRAMUSCULAR | Status: AC
  Filled 2021-07-12: qty 5

## 2021-07-12 MED ORDER — VASOPRESSIN 20 UNIT/ML IV SOLN
INTRAVENOUS | Status: AC
  Filled 2021-07-12: qty 1

## 2021-07-12 MED ORDER — TOBRAMYCIN SULFATE 1.2 G IJ SOLR
INTRAMUSCULAR | Status: DC | PRN
  Administered 2021-07-12: 7.2 g

## 2021-07-12 MED ORDER — SUCCINYLCHOLINE CHLORIDE 200 MG/10ML IV SOSY
PREFILLED_SYRINGE | INTRAVENOUS | Status: AC
  Filled 2021-07-12: qty 10

## 2021-07-12 MED ORDER — LACTATED RINGERS IV BOLUS: 500.0000 mL | Freq: Once | INTRAVENOUS | Status: DC

## 2021-07-12 MED ORDER — SODIUM CHLORIDE 0.9 % IV SOLN
510.0000 mg | Freq: Once | INTRAVENOUS | Status: AC
  Administered 2021-07-13: 510 mg via INTRAVENOUS
  Filled 2021-07-12: qty 17

## 2021-07-12 SURGICAL SUPPLY — 78 items
BAG COUNTER SPONGE SURGICOUNT (BAG) ×2 IMPLANT
BAG SURGICOUNT SPONGE COUNTING (BAG) ×2
BLADE HEX COATED 2.75 (ELECTRODE) ×3 IMPLANT
BLADE SAG 18X100X1.27 (BLADE) ×3 IMPLANT
BLADE SAW SAG 35X64 .89 (BLADE) ×3 IMPLANT
BLADE SAW SGTL 83.5X18.5 (BLADE) ×2 IMPLANT
BNDG ELASTIC 6X10 VLCR STRL LF (GAUZE/BANDAGES/DRESSINGS) ×1 IMPLANT
BOWL SMART MIX CTS (DISPOSABLE) ×3 IMPLANT
BRUSH FEMORAL CANAL (MISCELLANEOUS) ×2 IMPLANT
CEMENT BONE R 1X40 (Cement) ×8 IMPLANT
CHLORAPREP W/TINT 26 (MISCELLANEOUS) ×8 IMPLANT
CNTNR URN SCR LID CUP LEK RST (MISCELLANEOUS) IMPLANT
COMP FEM CMT PERSONA SZ9 RT (Joint) ×3 IMPLANT
COMP TIB NEXGEN CH/4 66 14 LT (Joint) ×3 IMPLANT
COMPONENT FEM CMT PRSONA SZ9RT (Joint) IMPLANT
COMPONENT TIB NXG CH/4 66 14LT (Joint) IMPLANT
CONT SPEC 4OZ STRL OR WHT (MISCELLANEOUS) ×6
COVER SURGICAL LIGHT HANDLE (MISCELLANEOUS) ×3 IMPLANT
CUFF TOURN SGL QUICK 34 (TOURNIQUET CUFF) ×2
CUFF TRNQT CYL 34X4.125X (TOURNIQUET CUFF) ×1 IMPLANT
DECANTER SPIKE VIAL GLASS SM (MISCELLANEOUS) ×1 IMPLANT
DERMABOND ADVANCED (GAUZE/BANDAGES/DRESSINGS)
DERMABOND ADVANCED .7 DNX12 (GAUZE/BANDAGES/DRESSINGS) ×1 IMPLANT
DRAPE EXTREMITY T 121X128X90 (DISPOSABLE) ×4 IMPLANT
DRAPE INCISE IOBAN 66X45 STRL (DRAPES) IMPLANT
DRAPE INCISE IOBAN 85X60 (DRAPES) ×3 IMPLANT
DRAPE POUCH INSTRU U-SHP 10X18 (DRAPES) ×2 IMPLANT
DRAPE SHEET LG 3/4 BI-LAMINATE (DRAPES) ×3 IMPLANT
DRAPE U-SHAPE 47X51 STRL (DRAPES) ×3 IMPLANT
DRESSING AQUACEL AG SP 3.5X10 (GAUZE/BANDAGES/DRESSINGS) ×1 IMPLANT
DRESSING PEEL AND PLAC PRVNA20 (GAUZE/BANDAGES/DRESSINGS) IMPLANT
DRSG AQUACEL AG SP 3.5X10 (GAUZE/BANDAGES/DRESSINGS)
DRSG PEEL AND PLACE PREVENA 20 (GAUZE/BANDAGES/DRESSINGS) ×3
GLOVE SRG 8 PF TXTR STRL LF DI (GLOVE) ×1 IMPLANT
GLOVE SURG ENC MOIS LTX SZ8 (GLOVE) ×6 IMPLANT
GLOVE SURG ORTHO LTX SZ6.5 (GLOVE) ×4 IMPLANT
GLOVE SURG ORTHO LTX SZ8 (GLOVE) ×4 IMPLANT
GLOVE SURG UNDER POLY LF SZ6.5 (GLOVE) ×4 IMPLANT
GLOVE SURG UNDER POLY LF SZ7 (GLOVE) ×2 IMPLANT
GLOVE SURG UNDER POLY LF SZ8 (GLOVE)
GOWN STRL REUS W/TWL XL LVL3 (GOWN DISPOSABLE) ×5 IMPLANT
HANDPIECE INTERPULSE COAX TIP (DISPOSABLE) ×4
HDLS TROCR DRIL PIN KNEE 75 (PIN) ×2
HOLDER FOLEY CATH W/STRAP (MISCELLANEOUS) IMPLANT
HOOD PEEL AWAY FLYTE STAYCOOL (MISCELLANEOUS) ×17 IMPLANT
IMMOBILIZER KNEE 22 UNIV (SOFTGOODS) ×2 IMPLANT
JET LAVAGE IRRISEPT WOUND (IRRIGATION / IRRIGATOR) ×3
LAVAGE JET IRRISEPT WOUND (IRRIGATION / IRRIGATOR) IMPLANT
MANIFOLD NEPTUNE II (INSTRUMENTS) ×5 IMPLANT
MARKER SKIN DUAL TIP RULER LAB (MISCELLANEOUS) ×6 IMPLANT
NS IRRIG 1000ML POUR BTL (IV SOLUTION) ×3 IMPLANT
PACK ICE MAXI GEL EZY WRAP (MISCELLANEOUS) IMPLANT
PACK TOTAL KNEE CUSTOM (KITS) ×3 IMPLANT
PENCIL BUTTON HOLSTER BLD 10FT (ELECTRODE) ×2 IMPLANT
PIN DRILL HDLS TROCAR 75 4PK (PIN) IMPLANT
PROTECTOR NERVE ULNAR (MISCELLANEOUS) ×1 IMPLANT
SCREW FEMALE HEX FIX 25X2.5 (ORTHOPEDIC DISPOSABLE SUPPLIES) ×2 IMPLANT
SEALER BIPOLAR AQUA 6.0 (INSTRUMENTS) ×2 IMPLANT
SET HNDPC FAN SPRY TIP SCT (DISPOSABLE) ×1 IMPLANT
SOLUTION IRRIG SURGIPHOR (IV SOLUTION) ×3 IMPLANT
SPONGE T-LAP 18X18 ~~LOC~~+RFID (SPONGE) ×17 IMPLANT
STOCKINETTE IMPERVIOUS LG (DRAPES) ×4 IMPLANT
SUT ETHILON 3 0 PS 1 (SUTURE) ×16 IMPLANT
SUT MNCRL AB 3-0 PS2 18 (SUTURE) ×3 IMPLANT
SUT PDS AB 1 CT  36 (SUTURE) ×6
SUT PDS AB 1 CT 36 (SUTURE) IMPLANT
SUT PDS AB 2-0 CT1 27 (SUTURE) ×4 IMPLANT
SUT STRATAFIX 0 PDS 27 VIOLET (SUTURE) ×3
SUT STRATAFIX 1PDS 45CM VIOLET (SUTURE) ×3 IMPLANT
SUT STRATAFIX PDO 1 14 VIOLET (SUTURE) ×2
SUT STRATFX PDO 1 14 VIOLET (SUTURE) ×1
SUT VIC AB 2-0 CT2 27 (SUTURE) ×6 IMPLANT
SUTURE STRATFX 0 PDS 27 VIOLET (SUTURE) ×1 IMPLANT
SUTURE STRATFX PDO 1 14 VIOLET (SUTURE) ×1 IMPLANT
SYR 50ML LL SCALE MARK (SYRINGE) ×1 IMPLANT
TRAY FOLEY MTR SLVR 14FR STAT (SET/KITS/TRAYS/PACK) ×2 IMPLANT
TRAY FOLEY MTR SLVR 16FR STAT (SET/KITS/TRAYS/PACK) IMPLANT
TUBE SUCTION HIGH CAP CLEAR NV (SUCTIONS) ×5 IMPLANT

## 2021-07-12 NOTE — Transfer of Care (Signed)
Immediate Anesthesia Transfer of Care Note  Patient: Danielle [name]  Procedure(s) Performed: EXCISIONAL TOTAL KNEE ARTHROPLASTY WITH ANTIBIOTIC SPACERS (Right: Knee)  Patient Location: PACU  Anesthesia Type:General  Level of Consciousness: awake and alert   Airway & Oxygen Therapy: Patient connected to face mask oxygen  Post-op Assessment: Report given to RN and Post -op Vital signs reviewed and stable  Post vital signs: Reviewed and stable  Last Vitals:  Vitals Value Taken Time  BP 142/128 07/12/21 2137  Temp 98.1   Pulse 71 07/12/21 2134  Resp 19 07/12/21 2139  SpO2 98 % 07/12/21 2134  Vitals shown include unvalidated device data.  Last Pain:  Vitals:   07/12/21 1525  TempSrc:   PainSc: 10-Worst pain ever      Patients Stated Pain Goal: 5 (95/97/47 1855)  Complications: No notable events documented.

## 2021-07-12 NOTE — Interval H&P Note (Signed)
The patient has been re-examined, and the chart reviewed, and there have been no interval changes to the documented history and physical.    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  Positive synovial alpha defensin, greater than 130,000 nucleated cells with over 90% neutrophils.  Discussed again with the patient and family that today we will perform an explant of her total knee replacement with thorough debridement.  We will plan for placement of antibiotic spacer.  Anticipate using articulating knee spacer as long as can be balanced well and bone stock is reasonable, otherwise would need a static spacer.  We will plan to convert the spacer to a revision knee replacement in approximately 3 months assuming her infection is eradicated.  The risks benefits and alternatives were discussed with the patient including but not limited to the risks of nonoperative treatment, versus surgical intervention including infection, bleeding, stiffness, leg length discrepancy, nerve injury, the need for revision surgery, hardware prominence, hardware failure, the need for hardware removal, blood clots, cardiopulmonary complications, morbidity, mortality, among others, and they were willing to proceed.  Consent was signed by myself and the patient.  Right knee was marked.    The operative side was examined and the patient was confirmed to have. Sens DPN, SPN, TN intact, Motor EHL, ext, flex 5/5, and DP 2+, PT 2+, No significant edema.

## 2021-07-12 NOTE — H&P (View-Only) (Signed)
° ° ° °  Subjective:  Patient reports pain as better controlled this morning. Lying comfortably in bed. Denies distal n/t. Discussed again plan for surgery this afternoon.  Objective:   VITALS:   Vitals:   07/11/21 2120 07/12/21 0422 07/12/21 0427 07/12/21 0736  BP: (!) 156/78 124/67  (!) 149/77  Pulse: 76  78 75  Resp: 16  18   Temp: 99.9 F (37.7 C)  98.8 F (37.1 C) 98.2 F (36.8 C)  TempSrc: Oral  Oral Oral  SpO2: 98%  93% 95%    Knee remains swollen, incision well healed. Discomfort with gentle ROM. No pain or swelling in other joints.  Moderate b/l distal pitting edema R>L improved since admission.  Lab Results  Component Value Date   WBC 8.7 07/12/2021   HGB 9.2 (L) 07/12/2021   HCT 30.1 (L) 07/12/2021   MCV 67.9 (L) 07/12/2021   PLT 230 07/12/2021   BMET    Component Value Date/Time   NA 137 07/12/2021 0343   K 3.9 07/12/2021 0343   CL 99 07/12/2021 0343   CO2 28 07/12/2021 0343   GLUCOSE 116 (H) 07/12/2021 0343   BUN 19 07/12/2021 0343   CREATININE 1.36 (H) 07/12/2021 0343   CALCIUM 8.7 (L) 07/12/2021 0343   GFRNONAA 41 (L) 07/12/2021 0343    Assessment/Plan:     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)   Type 2 diabetes mellitus with diabetic polyneuropathy, with long-term current use of insulin (HCC)   Septic arthritis (HCC)   Paroxysmal atrial fibrillation (HCC)   CKD (chronic kidney disease), stage III (HCC)   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)   MSSA (methicillin susceptible Staphylococcus aureus) infection   CHF (congestive heart failure) ([place])  R TKA concern for recurrent PJI after I&D and poly exchange on 04/14/2021                       Plan: -Will follow up cxs from clinic aspiration, but grossly consistent with purulence and recurrent infection - ID following, recs  appreciated, ancef started for presumed recurrence of MSSA PJI. Will sent cxs intraop. Synovasure aspiration from clinic with prelim results: + alpha defensin and >100k nucleated cell strongly positive for infection. - Med and cardiology c/s for periop optimization- recs appreciated. patient with poorly controlled diabetes (A1c 8.2), CKD, CAD with new worsening LE edema -patient tentatively schedule for TKA explant and implantation of abx spacer 2/15. Discussed plan with patient and family for 2 stage revision at this point. Will plan for articulating knee spacer following explant assuming appropriate stability and no significant bone loss. Risks and benefits were also discussed. - Continue to hold eliquis until after surgery.    April Carlyon A Loden Laurent 07/12/2021, 9:05 AM   Charlies Constable, MD  Contact information:   [phone] 7am-5pm epic message Dr. Zachery Dakins, or call office for patient follow up: [phone] After hours and holidays please check [email or web address] for group call information for [facility]

## 2021-07-12 NOTE — Progress Notes (Signed)
PROGRESS NOTE  Danielle [name]  DOB: Sep 17, 1945  PCP: Seward Carol, MD YWV:[id number]  DOA: 07/10/2021  LOS: 2 days  Hospital Day: 3  Brief narrative: Danielle [name] is a 76 y.o. female with PMH significant for obesity, OSA on CPAP, DM2, HTN, CAD with stents combined systolic and diastolic CHF, CKD 3A, left breast cancer, gout, Jehovah's Witness refusing blood transfusions. December 2019, patient underwent right knee arthroplasty by Dr. Mardelle Matte  November 2022, patient was seen in his office for severe acute pain and swelling of the right knee.  Right knee aspiration at that time was concerning for staph infection of her prosthetic knee joint.  She underwent I&D with liner exchange and was treated postoperatively with IV antibiotics.  She did well overall and was transitioned as an outpatient from the cefazolin to suppression with Keflex.  Evidently she ran out of Keflex approximately a week ago.  Also for the past 2 to 3 weeks, patient was getting progressively worsening pain and difficulty with ambulation. On 2/13, she was admitted again for prosthetic joint infection under orthopedics service. Hospital service was consulted for comanagement of medical issues. Labs on admission showed WBC count at 9, CRP elevated to 28, sed rate elevated to 64, creatinine at 1.22 which is better than in January.   Subjective: Patient was seen and examined this afternoon.  Pleasant, not in distress. Daughter at bedside. Labs from this morning with creatinine uptrending.  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  Noted a plan of OR this afternoon.  Uncontrolled type 2 diabetes mellitus -A1c 8.2 on 07/10/2021 -Home meds include Lantus 30 units, sliding scale insulin -Currently on Semglee 40 units daily and sliding scale insulin -Continue Neurontin for peripheral neuropathy Recent Labs  Lab 07/11/21 1612 07/11/21 2309 07/12/21 0417 07/12/21 0739 07/12/21 1157  GLUCAP 130* 86 119* 126* 124*   CAD/stents -Had a stent 3 times in the past, last in 2017. -Currently not having anginal symptoms. -Seen by cardiology on 2/14. -Continue Lipitor.  Eliquis on hold  Chronic combined systolic and diastolic CHF Essential hypertension -Last echo from December 2021 with EF 45 to 85%, grade 2 diastolic dysfunction, mildly elevated pulmonary artery systolic pressure -Home meds include Coreg 12.5 mg twice daily, torsemide 20 mg daily, losartan 25 mg daily and Aldactone 12.5 mg daily. -Blood pressure is normal but creatinine was up to 1.36 this morning.   -Continue Coreg.  Keep torsemide, losartan and Aldactone hold.  Plan to repeat BMP tomorrow and resume meds gradually  PAF -Eliquis on hold for incoming surgery.  Right leg swelling -Probably related to septic arthritis itself.  PTA, patient was already on Eliquis. -DVT scan pending.   CKD stage IIIa -Creatinine level stable continue losartan and torsemide. Recent Labs    04/15/21 0215 04/16/21 0758 04/17/21 0343 04/18/21 0222 04/19/21 0205 04/21/21 0250 04/24/21 0229 06/22/21 1243 07/10/21 1404 07/12/21 0343  BUN 36*  40* 42* 33* 30* 24* 18 35* 21 19  CREATININE 1.86* 1.68* 1.46* 1.25* 1.27* 1.23* 1.17* 1.49* 1.22* 1.36*   Hypothyroidism -Synthroid  Morbid obesity  -There is no height or weight on file to  calculate BMI. Patient has been advised to make an attempt to improve diet and exercise patterns to aid in weight loss.  OSA -CPAP nightly  Anemia of chronic disease -Hemoglobin at baseline seems to be between 8 and 9.  Currently over 10.  Continue to monitor.  Patient is a Jehovah witness and refuses blood transfusion. Recent Labs    04/19/21 0205 04/21/21 0250 04/23/21 1637 04/24/21 0229 07/10/21 1404 07/12/21 0343  HGB 8.6* 8.5*  --  7.0* 10.3* 9.2*  MCV 69.2* 67.9*  --  69.2* 69.0* 67.9*  FERRITIN  --   --  464*  --   --   --   TIBC  --   --  221*  --   --   --   IRON  --   --  23*  --   --   --   RETICCTPCT  --   --  1.4  --   --   --     Mobility: PT eval postprocedure Goals of care   Code Status: Full Code    Nutritional status:  There is no height or weight on file to calculate BMI.      Diet:  Diet Order             Diet NPO time specified Except for: Ice Chips, Sips with Meds  Diet effective now                   DVT prophylaxis:  SCDs Start: 07/10/21 1254   Antimicrobials: IV Ancef Fluid: None Family Communication: None at bedside  Status is: Inpatient  Continue in-hospital care because: Pending OR Level of care: Med-Surg   Dispo: The patient is from: Home              Anticipated d/c is to: Pending clinical course              Patient currently is not medically stable to d/c.   Difficult to place patient No     Infusions:    ceFAZolin (ANCEF) IV 2 g (07/12/21 1405)   methocarbamol (ROBAXIN) IV      Scheduled Meds:  atorvastatin  80 mg Oral QHS   carvedilol  12.5 mg Oral BID WC   empagliflozin  10 mg Oral Daily   gabapentin  300 mg Oral BID   insulin aspart  0-15 Units Subcutaneous TID WC   insulin aspart  0-5 Units Subcutaneous QHS   insulin glargine-yfgn  40 Units Subcutaneous QHS   levothyroxine  75 mcg Oral Q0600   senna  1 tablet Oral BID    PRN meds: acetaminophen, bisacodyl, diphenhydrAMINE, HYDROmorphone (DILAUDID)  injection, methocarbamol **OR** methocarbamol (ROBAXIN) IV, ondansetron **OR** ondansetron (ZOFRAN) IV, oxyCODONE, oxyCODONE, polyethylene glycol, sodium phosphate   Antimicrobials: Anti-infectives (From admission, onward)    Start     Dose/Rate Route Frequency Ordered Stop   07/10/21 1400  ceFAZolin (ANCEF) IVPB 2g/100 mL premix        2 g 200 mL/hr over 30 Minutes Intravenous Every 8 hours 07/10/21 1258         Objective: Vitals:   07/12/21 0427 07/12/21 0736  BP:  (!) 149/77  Pulse: 78 75  Resp: 18   Temp: 98.8 F (37.1 C)  98.2 F (36.8 C)  SpO2: 93% 95%    Intake/Output Summary (Last 24 hours) at 07/12/2021 1441 Last data filed at 07/11/2021 1606 Gross per 24 hour  Intake 340 ml  Output --  Net 340 ml   There were no vitals filed for this visit. Weight change:  There is no height or weight on file to calculate BMI.   Physical Exam: General exam: Pleasant, elderly African-American female.  Not in physical distress Skin: No rashes, lesions or ulcers. HEENT: Atraumatic, normocephalic, no obvious bleeding Lungs: Clear to auscultation bilaterally CVS: Regular rate and rhythm, no murmur GI/Abd soft, nontender, nondistended, bowel sound present CNS: Alert, awake, oriented times 3 Psychiatry: Mood appropriate Extremities: Pedal edema trace bilaterally, right more than left  Data Review: I have personally reviewed the laboratory data and studies available.  F/u labs ordered Unresulted Labs (From admission, onward)     Start     Ordered   07/12/21 4975  Basic metabolic panel  Daily,   R      07/11/21 1504   07/12/21 0500  CBC with Differential/Platelet  Daily,   R      07/11/21 1504            Signed, Terrilee Croak, MD [facility] 07/12/2021

## 2021-07-12 NOTE — Op Note (Addendum)
DATE OF SURGERY:  07/12/2021 TIME: 8:51 PM  PATIENT NAME:  Danielle [name]   AGE: 76 y.o.    PRE-OPERATIVE DIAGNOSIS: right prosthetic joint infection  POST-OPERATIVE DIAGNOSIS:  Same  PROCEDURE:   Explant right total knee arthroplasty with implantation of articulating antibiotic spacer  SURGEON:  Ayanna Gheen A Josefine Fuhr, MD   ASSISTANT: Izola Price, RNFA, present and scrubbed throughout the case, critical for assistance with exposure, retraction, instrumentation, and closure.   OPERATIVE IMPLANTS:  Cemented Zimmer persona CR size 9 femur, 24mm NexGen size 4 all poly CR tibia, handmade antibiotic cement dowels placed in the femur and tibial canals, 3 packs of regular high viscosity cement, 2 g of vancomycin and 2.4 g of tobramycin per pack of cement Implant Name Type Inv. Item Serial No. Manufacturer Lot No. LRB No. Used Action  CEMENT BONE R 1X40 - [id number] Cement CEMENT BONE R 1X40  ZIMMER RECON(ORTH,TRAU,BIO,SG) [id number] Right 1 Implanted  CEMENT BONE R 1X40 - [id number] Cement CEMENT BONE R 1X40  ZIMMER RECON(ORTH,TRAU,BIO,SG) [id number] Right 1 Implanted  CEMENT BONE R 1X40 - [id number] Cement CEMENT BONE R 1X40  ZIMMER RECON(ORTH,TRAU,BIO,SG) [id number] Right 1 Implanted  Persona 2.67mm Female Hex Screw     Zimmer  Right 1 Implanted  COMP FEM CMT PERSONA SZ9 RT - [id number] Joint COMP FEM CMT PERSONA SZ9 RT  ZIMMER RECON(ORTH,TRAU,BIO,SG) [id number] Right 1 Implanted  zimmer     [id number] Right 1 Implanted      PREOPERATIVE INDICATIONS: Danielle [name] is a 76 y.o. year old female who had undergone right total knee arthroplasty with Dr. Mardelle Matte in 2019 and had an uncomplicated recovery and had done well for many years.  Presented in November with acute right knee pain, fevers, and delirium.  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  Cultures were positive for MSSA and she underwent an irrigation and debridement with liner exchange.  She had done well initially after  this procedure but presented to clinic earlier this week with approximately 2 to 3 weeks of worsening symptoms in the right knee and difficulty with ambulation.  Aspiration of the right knee was concerning for purulence.  Synovasure preliminary results demonstrate positive staph cultures, positive alpha defense, 130,000 nucleated cells, greater than 90% neutrophils.  Given these findings and recurrence of infection knee replacement explant with placement of antibiotic spacer was indicated.  Discussed plan for articulating spacer with possible need for a static spacer if significant bone loss or difficulty balancing the articulating spacer.  Patient had notable risk factors going into the procedure including poorly controlled diabetes with A1c greater than 8.  Obesity with a BMI of 36.  Another concerning factor going into surgery is her starting hemoglobin of only 9.  Given the extensive amount of debridement necessary for such a procedure anticipate her hemoglobin to drop perioperatively.  Patient is a Restaurant manager, fast food and understands of the risks going into surgery and elects to withhold blood products. Additionally we will plan will be for conversion to a revision knee replacement in approximately 3 to 4 months once her infection has been cleared.  The risks, benefits, and alternatives were discussed at length including but not limited to the risks of infection, bleeding, nerve injury, stiffness, blood clots, the need for revision surgery, cardiopulmonary complications, among others, and they were willing to proceed  ESTIMATED BLOOD LOSS: 1066mL, significant synovial bleeding despite using tourniquet, as well as bony bleeding from debridement of the medullary canals and bone ends once the implants were  removed. Good hemostatis was achieved once debridement was completed using aquamantis and increasing pressure on the tourniquet. Excellent hemostasis at at the end of the case once tourniquet was let down  following final spacer implantation.   OPERATIVE DESCRIPTION:  Once adequate anesthesia, preoperative antibiotics, 2 g of Ancef and 1 g of vancomycin was administered. The patient was positioned supine with a right thigh tourniquet placed.  The right lower extremity was prepped and draped in sterile fashion.  A time-  out was performed identifying the patient, planned procedure, and the appropriate extremity.     The leg was  exsanguinated, tourniquet elevated to 250 mmHg for the first hour and increase to 300 mg meters mercury for the second hour.  A midline incision was  made utilizing her old scar followed by median parapatellar arthrotomy.  Abundant purulent fluid was encountered upon arthrotomy.  A 360 degree synovectomy was performed clearing the medial and lateral gutters of the suprapatellar pouch, and infrapatellar fat pad.  3 synovial specimens were sent for aerobic anaerobic culture.  Next a sawblade was used to remove the polyethylene patellar component.  Using a drill and rongeurs all the cement and the pegs were resected.  Next we turned our attention to the femoral component.  Using a short ACL blade and quarter inch straight and curved osteotomes we worked circumferentially around the femur.  The polyethylene liner was then removed.  Using a tamp the femoral component was removed.  There was minimal bone loss during the removal.  Next we turned our attention to explant of the tibial component.  A sagittal ACL blade was again used to work around the tibial component.  The tibial component was found to be loose and there was notable debonding of the component from the cement.  Using the tibial cutting guide I then recut the tibia removing approximately an additional 2 to 4 mm of bone.  All the cement was then removed from the prior tibial keel.  Next we turned our attention to reaming the femoral and tibial canals.  Both canals were reamed up to 16 mm.  Using a curette the canals were also  debrided.  We then sized our femur to be a size 9.  We felt that we had a reasonable fit given minimal bone loss and appropriate rotation.  No additional bone cuts were made.  On the tibia side we sized her tibia to be a size 4 CR femur. Using a laminar spreader. The posterior capsule was visualized and synovial resection was carefully carried out along the posterior capsule.  The tibial trial baseplate was pinned into place and the size 9 femur was inserted.  Using a size 14 mm poly we then trialed the knee and felt we had full extension full flexion and good stability with varus and valgus stress.  At this point we felt that our debridement was adequate and irrigated the knee using pulse lavage 6 L normal saline.  We then irrigated with Irrisept irrigation.  We then removed dirty outer drapes and redraped the knee.  Myself and all surgical staff rescrubbed as well.  With fresh drapes and gowns and gloves, we then irrigated the knee with an additional 3 L of saline using pulse lavage.  We then hand mixed our first batch of cement which had 2 g of vancomycin and 2.4 g of tobramycin and it.  We fashion small handmade dowels to place into the distal femur and proximal tibial canals. We then opened our  final implants.  An additional 2 batches of cement which were also mixed with 4 g of vancomycin and 4.8 g of tobramycin was prepared.    Final implants were then cemented onto cleaned and dried cut surfaces of bone with the knee brought to extension. The knee was irrigated with an additional 3 L of normal saline pulse lavage.   Once the cement had fully cured, excess cement was removed  throughout the knee.  I confirmed that I was satisfied with the range of motion and stability.         The tourniquet had been let down at 120 minutes.  No significant hemostasis was required.  The medial parapatellar arthrotomy was then reapproximated using #1 PDS and #1 Stratafix sutures with the knee  in flexion.  The    remaining wound was closed 2-0 PDS and interrupted 3-0 nylon.   The knee was cleaned, dried, dressed with a prevena incisional vac dressing.  The patient was then  brought to recovery room in stable condition, tolerating the procedure  well. There were no complications.   Post op recs: WB: 50% PWB, knee immobilizer when walking.  Can be removed when in bed or working with physical therapy Abx: continue ancef per infectious disease recommendations, follow-up Intra-Op cultures and final ID recs Imaging: PACU xrays DVT prophylaxis: Aspirin 81mg  starting POD1-2. Possible resume eliquis POD3 if hemoglobin stable Follow up:1 week after surgery for a wound check with Dr. Zachery Dakins at [facility].  Address: [address], [place], [place] [address]  Office Phone: [phone]  Charlies Constable, MD Orthopaedic Surgery

## 2021-07-12 NOTE — Anesthesia Procedure Notes (Signed)
Procedure Name: Intubation Date/Time: 07/12/2021 4:58 PM Performed by: Lorie Phenix, CRNA Pre-anesthesia Checklist: Patient identified, Emergency Drugs available, Suction available and Patient being monitored Patient Re-evaluated:Patient Re-evaluated prior to induction Oxygen Delivery Method: Circle system utilized Preoxygenation: Pre-oxygenation with 100% oxygen Induction Type: IV induction Ventilation: Mask ventilation without difficulty Laryngoscope Size: Mac and 3 Grade View: Grade I Tube type: Oral Tube size: 7.5 mm Number of attempts: 1 Airway Equipment and Method: Stylet Placement Confirmation: ETT inserted through vocal cords under direct vision, positive ETCO2 and breath sounds checked- equal and bilateral Secured at: 22 cm Tube secured with: Tape Dental Injury: Teeth and Oropharynx as per pre-operative assessment

## 2021-07-12 NOTE — Anesthesia Preprocedure Evaluation (Addendum)
Anesthesia Evaluation  Patient identified by MRN, date of birth, ID band Patient awake    Reviewed: Allergy & Precautions, NPO status , Patient's Chart, lab work & pertinent test results, reviewed documented beta blocker date and time   History of Anesthesia Complications (+) PROLONGED EMERGENCE and history of anesthetic complications  Airway Mallampati: III  TM Distance: >3 FB Neck ROM: Full    Dental  (+) Edentulous Upper, Poor Dentition   Pulmonary shortness of breath and with exertion, sleep apnea and Continuous Positive Airway Pressure Ventilation , former smoker,    Pulmonary exam normal breath sounds clear to auscultation       Cardiovascular Exercise Tolerance: Poor hypertension, Pt. on medications and Pt. on home beta blockers + CAD, + Cardiac Stents and +CHF  Normal cardiovascular exam+ dysrhythmias Atrial Fibrillation + pacemaker + Valvular Problems/Murmurs  Rhythm:Regular Rate:Normal  Echo 04/29/20 1. Left ventricular ejection fraction, by estimation, is 45 to 50%. The left ventricle has mildly decreased function. The left ventricle demonstrates regional wall motion abnormalities (see scoring diagram/findings for description). There is mild concentric left ventricular hypertrophy. Left ventricular diastolic parameters are consistent with Grade II diastolic dysfunction (pseudonormalization). Elevated left atrial pressure. There is mild hypokinesis of the left ventricular, apical inferior wall, anterior wall and septal wall.  2. Right ventricular systolic function is normal. The right ventricular size is normal. There is mildly elevated pulmonary artery systolic pressure.  3. Left atrial size was severely dilated.  4. The mitral valve is degenerative. Mild to moderate mitral valve regurgitation. Mild mitral stenosis. The mean mitral valve gradient is 4.0 mmHg. Moderate mitral annular calcification.  5. The aortic valve is  tricuspid. There is mild calcification of the aortic valve. There is mild thickening of the aortic valve. Aortic valve regurgitation is mild. Mild aortic valve sclerosis is present, with no evidence of aortic valve stenosis.  6. The inferior vena cava is dilated in size with <50% respiratory variability, suggesting right atrial pressure of 15 mmHg.   EKG 06/15/21 NSR, Inferior and anterior infarct  CAD s/p multiple stents in [place] (with h/o LAD stent thrombosis)  NYHA Class II ischemic cardiomyopathy (EF 10% at one point)  symptomatic bradycardia s/p PPM   Neuro/Psych Diabetic peripheral neuropathy and retinopathy  Neuromuscular disease negative psych ROS   GI/Hepatic negative GI ROS, Neg liver ROS,   Endo/Other  diabetes, Poorly Controlled, Type 2, Oral Hypoglycemic AgentsHypothyroidism Hx/o left Breast Ca S/P lumpectomy and RT  Hyperlipidemia  Gout  Obesity  Renal/GU Renal InsufficiencyRenal disease  negative genitourinary   Musculoskeletal  (+) Arthritis , Osteoarthritis,  Infected Right Total Knee Arthroplasty   Abdominal (+) + obese,   Peds  Hematology  (+) Blood dyscrasia, anemia , REFUSES BLOOD PRODUCTS, JEHOVAH'S WITNESSEliquis therapy- last dose 2/12  Lovenox therapy- last dose 2139 on 2/14   Anesthesia Other Findings   Reproductive/Obstetrics                            Anesthesia Physical Anesthesia Plan  ASA: 3  Anesthesia Plan: General   Post-op Pain Management:    Induction: Intravenous  PONV Risk Score and Plan: 3 and Treatment may vary due to age or medical condition, Propofol infusion and Ondansetron  Airway Management Planned: Oral ETT  Additional Equipment:   Intra-op Plan:   Post-operative Plan: Extubation in OR  Informed Consent: I have reviewed the patients History and Physical, chart, labs and discussed the procedure including the  risks, benefits and alternatives for the proposed anesthesia with the patient or  authorized representative who has indicated his/her understanding and acceptance.     Dental advisory given  Plan Discussed with: CRNA and Anesthesiologist  Anesthesia Plan Comments:       Anesthesia Quick Evaluation

## 2021-07-12 NOTE — Progress Notes (Signed)
° ° ° °  Subjective:  Patient reports pain as better controlled this morning. Lying comfortably in bed. Denies distal n/t. Discussed again plan for surgery this afternoon.  Objective:   VITALS:   Vitals:   07/11/21 2120 07/12/21 0422 07/12/21 0427 07/12/21 0736  BP: (!) 156/78 124/67  (!) 149/77  Pulse: 76  78 75  Resp: 16  18   Temp: 99.9 F (37.7 C)  98.8 F (37.1 C) 98.2 F (36.8 C)  TempSrc: Oral  Oral Oral  SpO2: 98%  93% 95%    Knee remains swollen, incision well healed. Discomfort with gentle ROM. No pain or swelling in other joints.  Moderate b/l distal pitting edema R>L improved since admission.  Lab Results  Component Value Date   WBC 8.7 07/12/2021   HGB 9.2 (L) 07/12/2021   HCT 30.1 (L) 07/12/2021   MCV 67.9 (L) 07/12/2021   PLT 230 07/12/2021   BMET    Component Value Date/Time   NA 137 07/12/2021 0343   K 3.9 07/12/2021 0343   CL 99 07/12/2021 0343   CO2 28 07/12/2021 0343   GLUCOSE 116 (H) 07/12/2021 0343   BUN 19 07/12/2021 0343   CREATININE 1.36 (H) 07/12/2021 0343   CALCIUM 8.7 (L) 07/12/2021 0343   GFRNONAA 41 (L) 07/12/2021 0343    Assessment/Plan:     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)   Type 2 diabetes mellitus with diabetic polyneuropathy, with long-term current use of insulin (HCC)   Septic arthritis (HCC)   Paroxysmal atrial fibrillation (HCC)   CKD (chronic kidney disease), stage III (HCC)   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)   MSSA (methicillin susceptible Staphylococcus aureus) infection   CHF (congestive heart failure) ([place])  R TKA concern for recurrent PJI after I&D and poly exchange on 04/14/2021                       Plan: -Will follow up cxs from clinic aspiration, but grossly consistent with purulence and recurrent infection - ID following, recs  appreciated, ancef started for presumed recurrence of MSSA PJI. Will sent cxs intraop. Synovasure aspiration from clinic with prelim results: + alpha defensin and >100k nucleated cell strongly positive for infection. - Med and cardiology c/s for periop optimization- recs appreciated. patient with poorly controlled diabetes (A1c 8.2), CKD, CAD with new worsening LE edema -patient tentatively schedule for TKA explant and implantation of abx spacer 2/15. Discussed plan with patient and family for 2 stage revision at this point. Will plan for articulating knee spacer following explant assuming appropriate stability and no significant bone loss. Risks and benefits were also discussed. - Continue to hold eliquis until after surgery.    Danielle [name] A Danielle [name] 07/12/2021, 9:05 AM   Charlies Constable, MD  Contact information:   [phone] 7am-5pm epic message Dr. Zachery Dakins, or call office for patient follow up: [phone] After hours and holidays please check [email or web address] for group call information for [facility]

## 2021-07-12 NOTE — Consult Note (Signed)
° °NAME:  Danielle [name], MRN:  [id number], DOB:  04/07/1946, LOS: 2 °ADMISSION DATE:  07/10/2021, CONSULTATION DATE:  07/12/21 °REFERRING MD:  TRH, CHIEF COMPLAINT:  postop  ° °History of Present Illness:  °75 year old woman w/ hx of HFpEF, CAD w/ multiple prior PCIs, SSS s/p PPM, Afib on AC, OSA on CPAP, CKD< HTN, DM on insulin p/w recurrent R knee prosthetic infection requiring total explant today with antibiotic spacer placement.  EBL 1000cc.  Given 2.8 L crystalloid.  PCCM consulted for persistent hypotension postop. ° °Currently patient c/o knee pain, denies dizziness of SOB ° °Of not she is a practicing Jehovah's witness who is okay with albumin but not blood products. ° °Daughter is a unit secretary in [employer], present at bedside to corroborate hx. ° °Pertinent  Medical History  ° °Past Medical History:  °Diagnosis Date  ° Acute combined systolic and diastolic congestive heart failure (HCC) 02/05/2017  ° 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 05/20/2018  ° Acute kidney failure (HCC) 05/15/2018  ° Arthritis   ° "all over" (05/22/2018)  ° Blood dyscrasia   ° per pt-has small blood cells-appears as if anemic  ° Breast cancer, left breast (HCC) 1985  ° CHF (congestive heart failure) (HCC)   ° Complication of anesthesia   ° difficult to awaken from per pt  ° Coronary artery disease   ° Dyspnea   ° Essential hypertension   ° Heart disease   ° Heart murmur   ° History of gout   ° Hypothyroidism   ° Obesity (BMI 30-39.9) 11/11/2017  ° OSA (obstructive sleep apnea) 02/18/2017  °  severe obstructive sleep apnea with an AHI of 75.6/h and mild central sleep apnea with a CAI of 7.7/h.  Oxygen saturations dropped as low as 82%.   He is on CPAP at 9 cm H2O.  ° OSA on CPAP   ° Personal history of chemotherapy   ° Personal history of radiation therapy   ° Presence of permanent cardiac pacemaker   ° Primary localized osteoarthritis of right knee 05/13/2018  ° Refusal of blood transfusions as patient is Jehovah's  Witness   ° Type II diabetes mellitus (HCC)   ° ° ° °Significant Hospital Events: °Including procedures, antibiotic start and stop dates in addition to other pertinent events   °2/13 admitted, cardiology consulted for pre-op risk assessment °2/15 TKA removal ° °Interim History / Subjective:  °Consulted. ° °Objective   °Blood pressure (!) 77/53, pulse 78, temperature 98.1 °F (36.7 °C), resp. rate 11, height 5' 4" (1.626 m), weight 95.7 kg, SpO2 100 %. °   °   ° °Intake/Output Summary (Last 24 hours) at 07/12/2021 2348 °Last data filed at 07/12/2021 2111 °Gross per 24 hour  °Intake 2600 ml  °Output 1400 ml  °Net 1200 ml  ° °Filed Weights  ° 07/12/21 1501  °Weight: 95.7 kg  ° ° °Examination: °General: no distress °HENT: MMM, trachea midline, malampatti 4 °Lungs: Clear, no wheezing or accessory muscle use °Cardiovascular: heart sounds regular, sinus on monitor, ext warm °Abdomen: soft, hypoactive BS °Extremities: R knee wrapped without strikethrough, no edema °Neuro: able to move all 4 ext to command °Psych: RASS 0 ° °Resolved Hospital Problem list   °N/a ° °Assessment & Plan:  °Postoperative shock some combination ABLA, anesthesia effect, septic seeding. ° °TKA infection recurrent ° °Extensive PMH including CKD, IDDM, Afib on AC (held), HTN, HLD, CKD, breast cancer in remission, ischemic cardiomyopathy, OSA on CPAP ° °  Jehovah's witness with refusal of blood products  - Levophed peripheral protocol - 500cc additional LR - IV iron - Albumin - Continue ancef f/u culture data - Insulin and PTA meds as ordered - Daughter updated at bedside  Best Practice (right click and "Reselect all SmartList Selections" daily)   Diet/type: Regular consistency (see orders) DVT prophylaxis: On hold postop GI prophylaxis: N/A Lines: N/A Foley:  N/A Code Status:  full code Last date of multidisciplinary goals of care discussion [done with daughter at bedside]  Labs   CBC: Recent Labs  Lab 07/10/21 1404 07/12/21 0343   WBC 9.0 8.7  NEUTROABS  --  5.1  HGB 10.3* 9.2*  HCT 34.3* 30.1*  MCV 69.0* 67.9*  PLT 219 101    Basic Metabolic Panel: Recent Labs  Lab 07/10/21 1404 07/12/21 0343  NA 136 137  K 3.7 3.9  CL 97* 99  CO2 29 28  GLUCOSE 191* 116*  BUN 21 19  CREATININE 1.22* 1.36*  CALCIUM 9.2 8.7*   GFR: Estimated Creatinine Clearance: 40.7 mL/min (A) (by C-G formula based on SCr of 1.36 mg/dL (H)). Recent Labs  Lab 07/10/21 1404 07/12/21 0343  WBC 9.0 8.7    Liver Function Tests: Recent Labs  Lab 07/10/21 1404  AST 36  ALT 33  ALKPHOS 70  BILITOT 0.9  PROT 7.6  ALBUMIN 3.3*   No results for input(s): LIPASE, AMYLASE in the last 168 hours. No results for input(s): AMMONIA in the last 168 hours.  ABG No results found for: PHART, PCO2ART, PO2ART, HCO3, TCO2, ACIDBASEDEF, O2SAT   Coagulation Profile: No results for input(s): INR, PROTIME in the last 168 hours.  Cardiac Enzymes: No results for input(s): CKTOTAL, CKMB, CKMBINDEX, TROPONINI in the last 168 hours.  HbA1C: Hgb A1c MFr Bld  Date/Time Value Ref Range Status  07/10/2021 02:08 PM 8.2 (H) 4.8 - 5.6 % Final    Comment:    (NOTE) Pre diabetes:          5.7%-6.4%  Diabetes:              >6.4%  Glycemic control for   <7.0% adults with diabetes   04/13/2021 03:16 PM 7.5 (H) 4.8 - 5.6 % Final    Comment:    (NOTE) Pre diabetes:          5.7%-6.4%  Diabetes:              >6.4%  Glycemic control for   <7.0% adults with diabetes     CBG: Recent Labs  Lab 07/12/21 0417 07/12/21 0739 07/12/21 1157 07/12/21 1809 07/12/21 2130  GLUCAP 119* 126* 124* 138* 207*    Review of Systems:    Positive Symptoms in bold:  Constitutional fevers, chills, weight loss, fatigue, anorexia, malaise  Eyes decreased vision, double vision, eye irritation  Ears, Nose, Mouth, Throat sore throat, trouble swallowing, sinus congestion  Cardiovascular chest pain, paroxysmal nocturnal dyspnea, lower ext edema,  palpitations   Respiratory SOB, cough, DOE, hemoptysis, wheezing  Gastrointestinal nausea, vomiting, diarrhea  Genitourinary burning with urination, trouble urinating  Musculoskeletal joint aches, joint swelling, back pain  Integumentary  rashes, skin lesions  Neurological focal weakness, focal numbness, trouble speaking, headaches  Psychiatric depression, anxiety, confusion  Endocrine polyuria, polydipsia, cold intolerance, heat intolerance  Hematologic abnormal bruising, abnormal bleeding, unexplained nose bleeds  Allergic/Immunologic recurrent infections, hives, swollen lymph nodes     Past Medical History:  She,  has a past medical history of Acute combined systolic  and diastolic congestive heart failure (HCC) (02/05/2017), 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 (05/20/2018), Acute kidney failure (HCC) (05/15/2018), Arthritis, Blood dyscrasia, Breast cancer, left breast (HCC) (1985), CHF (congestive heart failure) (HCC), Complication of anesthesia, Coronary artery disease, Dyspnea, Essential hypertension, Heart disease, Heart murmur, History of gout, Hypothyroidism, Obesity (BMI 30-39.9) (11/11/2017), OSA (obstructive sleep apnea) (02/18/2017), OSA on CPAP, Personal history of chemotherapy, Personal history of radiation therapy, Presence of permanent cardiac pacemaker, Primary localized osteoarthritis of right knee (05/13/2018), Refusal of blood transfusions as patient is Jehovah's Witness, and Type II diabetes mellitus (HCC).  ° °Surgical History:  ° °Past Surgical History:  °Procedure Laterality Date  ° APPLICATION OF WOUND VAC Right 04/14/2021  ° Procedure: APPLICATION OF WOUND VAC;  Surgeon: Marchwiany, Fareeda Downard A, MD;  Location: [facility] OR;  Service: Orthopedics;  Laterality: Right;  ° BREAST BIOPSY Left 1985  ° BREAST LUMPECTOMY Left 1985  ° BREAST LUMPECTOMY WITH NEEDLE LOCALIZATION AND AXILLARY LYMPH NODE DISSECTION  1985  ° CATARACT EXTRACTION W/ INTRAOCULAR LENS  IMPLANT,  BILATERAL Bilateral   ° CORONARY ANGIOPLASTY WITH STENT PLACEMENT  2010, 2012,2017  °  2 done 2010 and 1 replaced 2012 and 2 replaced in 2017  ° DILATION AND CURETTAGE OF UTERUS    ° I & D KNEE WITH POLY EXCHANGE Right 04/14/2021  ° Procedure: IRRIGATION AND DEBRIDEMENT KNEE WITH POLY EXCHANGE;  Surgeon: Marchwiany, Scout Guyett A, MD;  Location: [facility] OR;  Service: Orthopedics;  Laterality: Right;  ° INSERT / REPLACE / REMOVE PACEMAKER  07/19/2014  ° JOINT REPLACEMENT    ° REPLACEMENT TOTAL KNEE Left 09/2012  ° THYROIDECTOMY, PARTIAL  1978  ° TONSILLECTOMY    ° TOTAL KNEE ARTHROPLASTY Right 05/13/2018  ° Procedure: TOTAL KNEE ARTHROPLASTY;  Surgeon: Landau, Joshua, MD;  Location: [facility];  Service: Orthopedics;  Laterality: Right;  ° TUBAL LIGATION    °  ° °Social History:  ° reports that she quit smoking about 40 years ago. Her smoking use included cigarettes. She has a 22.00 pack-year smoking history. She has never used smokeless tobacco. She reports current alcohol use. She reports that she does not currently use drugs.  ° °Family History:  °Her family history includes Diabetes in her sister; Heart disease in her brother and father; Multiple myeloma in her mother; Other in her sister and sister.  ° °Allergies °Allergies  °Allergen Reactions  ° Other Other (See Comments)  °  Blood Product Refusal (Jehovah's witness) ° °  ° Tape Other (See Comments)  °  Leaves blisters and marks on the skin  ° Sulfa Antibiotics Rash  ° Uloric [Febuxostat] Rash  °  ° °Home Medications  °Prior to Admission medications   °Medication Sig Start Date End Date Taking? Authorizing Provider  °acetaminophen (TYLENOL) 500 MG tablet Take 500-1,000 mg by mouth every 6 (six) hours as needed (for pain or headaches).   Yes [provider]  °clotrimazole-betamethasone (LOTRISONE) lotion Apply 1 application topically 2 (two) times daily as needed (itching). 06/16/20  Yes [provider]  °HYDROcodone-acetaminophen (NORCO/VICODIN) 5-325 MG  tablet Take 1 tablet by mouth every 4 (four) hours as needed for pain. 07/10/21  Yes [provider]  °allopurinol (ZYLOPRIM) 100 MG tablet Take 100 mg by mouth daily.    [provider]  °apixaban (ELIQUIS) 5 MG TABS tablet Take 1 tablet (5 mg total) by mouth 2 (two) times daily. 10/19/20   Bensimhon, Tyre Beaver R, MD  °atorvastatin (LIPITOR) 80 MG tablet Take 1   tablet (80 mg total) by mouth at bedtime. 03/29/21   Bensimhon, Daniel R, MD  °Biotin 10 MG TABS Take 10 mg by mouth daily.    [provider]  °Blood Glucose Monitoring Suppl (ONETOUCH VERIO) w/Device KIT Use to check blood sugar 01/10/21   Shamleffer, Ibtehal Jaralla, MD  °carvedilol (COREG) 12.5 MG tablet Take 1 tablet (12.5 mg total) by mouth 2 (two) times daily with a meal. 06/13/21   Bensimhon, Daniel R, MD  °Continuous Blood Gluc Sensor (DEXCOM G6 SENSOR) MISC 1 Device by Does not apply route as directed. 01/19/21   Shamleffer, Ibtehal Jaralla, MD  °Continuous Blood Gluc Transmit (DEXCOM G6 TRANSMITTER) MISC 1 Device by Does not apply route as directed. 01/19/21   Shamleffer, Ibtehal Jaralla, MD  °empagliflozin (JARDIANCE) 10 MG TABS tablet Take 10 mg by mouth daily.    [provider]  °Ferrous Sulfate (IRON) 325 (65 Fe) MG TABS Take 1 tablet (325 mg total) by mouth daily. °Patient taking differently: Take 1 tablet by mouth every other day. 03/21/21   Bensimhon, Daniel R, MD  °gabapentin (NEURONTIN) 300 MG capsule Take 300 mg by mouth 2 (two) times daily.    [provider]  °Insulin Glargine (BASAGLAR KWIKPEN) 100 UNIT/ML Inject 40 Units into the skin at bedtime. 05/26/21   [provider]  °insulin lispro (HUMALOG) 100 UNIT/ML injection Inject 0.05 mLs (5 Units total) into the skin 3 (three) times daily before meals. °Patient taking differently: Inject 5-15 Units into the skin 3 (three) times daily before meals. Per sliding scale 04/24/21   Hall, Carole N, DO  °latanoprost (XALATAN) 0.005 % ophthalmic  solution Place 1 drop into both eyes at bedtime.    [provider]  °levothyroxine (SYNTHROID, LEVOTHROID) 75 MCG tablet Take 75 mcg by mouth daily before breakfast.    [provider]  °losartan (COZAAR) 25 MG tablet Take 1 tablet (25 mg total) by mouth daily. 06/13/21   Bensimhon, Daniel R, MD  °LUMIGAN 0.01 % SOLN Place 1 drop into both eyes at bedtime. 07/23/20   [provider]  °Multiple Vitamins-Minerals (ALIVE ONCE DAILY WOMENS 50+ PO) Take 1 tablet by mouth daily.    [provider]  °polyethylene glycol (MIRALAX / GLYCOLAX) 17 g packet Take 17 g by mouth daily. 04/25/21   Hall, Carole N, DO  °potassium chloride (KLOR-CON M) 10 MEQ tablet Take 2 tablets (20 mEq total) by mouth daily. 05/30/21   Bensimhon, Daniel R, MD  °spironolactone (ALDACTONE) 25 MG tablet Take 0.5 tablets (12.5 mg total) by mouth daily. 06/15/21 09/13/21  Finch, Lindsay Nicole, PA-C  °torsemide (DEMADEX) 20 MG tablet Take 2 tablets (40 mg total) by mouth daily. 10/19/20   Bensimhon, Daniel R, MD  °Travoprost, BAK Free, (TRAVATAN) 0.004 % SOLN ophthalmic solution Place 1 drop into both eyes at bedtime.     [provider]  °  ° °Critical care time: 32 mins °  ° ° ° ° ° °

## 2021-07-12 NOTE — Progress Notes (Signed)
Mobility Specialist Progress Note    07/12/21 1612  Mobility  Activity Off unit   Will f/u as schedule permits.   [facility] Mobility Specialist  M.S. 5N: [phone]

## 2021-07-13 ENCOUNTER — Encounter (HOSPITAL_COMMUNITY): Payer: Self-pay | Admitting: Orthopedic Surgery

## 2021-07-13 ENCOUNTER — Other Ambulatory Visit: Payer: Self-pay

## 2021-07-13 ENCOUNTER — Inpatient Hospital Stay: Payer: Self-pay

## 2021-07-13 DIAGNOSIS — A4901 Methicillin susceptible Staphylococcus aureus infection, unspecified site: Secondary | ICD-10-CM | POA: Diagnosis not present

## 2021-07-13 DIAGNOSIS — E1122 Type 2 diabetes mellitus with diabetic chronic kidney disease: Secondary | ICD-10-CM | POA: Diagnosis not present

## 2021-07-13 DIAGNOSIS — E1165 Type 2 diabetes mellitus with hyperglycemia: Secondary | ICD-10-CM | POA: Diagnosis not present

## 2021-07-13 DIAGNOSIS — N1832 Chronic kidney disease, stage 3b: Secondary | ICD-10-CM | POA: Diagnosis not present

## 2021-07-13 DIAGNOSIS — R579 Shock, unspecified: Secondary | ICD-10-CM

## 2021-07-13 DIAGNOSIS — T8453XA Infection and inflammatory reaction due to internal right knee prosthesis, initial encounter: Secondary | ICD-10-CM | POA: Diagnosis not present

## 2021-07-13 LAB — BASIC METABOLIC PANEL
Anion gap: 10 (ref 5–15)
BUN: 25 mg/dL — ABNORMAL HIGH (ref 8–23)
CO2: 26 mmol/L (ref 22–32)
Calcium: 7.9 mg/dL — ABNORMAL LOW (ref 8.9–10.3)
Chloride: 101 mmol/L (ref 98–111)
Creatinine, Ser: 1.41 mg/dL — ABNORMAL HIGH (ref 0.44–1.00)
GFR, Estimated: 39 mL/min — ABNORMAL LOW (ref >60)
Glucose, Bld: 176 mg/dL — ABNORMAL HIGH (ref 70–99)
Potassium: 4.8 mmol/L (ref 3.5–5.1)
Sodium: 137 mmol/L (ref 135–145)

## 2021-07-13 LAB — GLUCOSE, CAPILLARY
Glucose-Capillary: 177 mg/dL — ABNORMAL HIGH (ref 70–99)
Glucose-Capillary: 159 mg/dL — ABNORMAL HIGH (ref 70–99)
Glucose-Capillary: 142 mg/dL — ABNORMAL HIGH (ref 70–99)
Glucose-Capillary: 128 mg/dL — ABNORMAL HIGH (ref 70–99)

## 2021-07-13 LAB — MRSA NEXT GEN BY PCR, NASAL: MRSA by PCR Next Gen: NOT DETECTED

## 2021-07-13 MED ORDER — SODIUM CHLORIDE 0.9% FLUSH
10.0000 mL | Freq: Two times a day (BID) | INTRAVENOUS | Status: DC
  Administered 2021-07-14 – 2021-08-01 (×19): 10 mL

## 2021-07-13 MED ORDER — ASPIRIN EC 81 MG PO TBEC
81.0000 mg | DELAYED_RELEASE_TABLET | Freq: Every day | ORAL | Status: DC
  Administered 2021-07-13: 81 mg via ORAL
  Filled 2021-07-13: qty 1

## 2021-07-13 MED ORDER — CARVEDILOL 12.5 MG PO TABS
12.5000 mg | ORAL_TABLET | Freq: Two times a day (BID) | ORAL | Status: DC
  Administered 2021-07-13: 12.5 mg via ORAL
  Filled 2021-07-13: qty 1

## 2021-07-13 MED ORDER — CARVEDILOL 6.25 MG PO TABS: 6.2500 mg | ORAL_TABLET | Freq: Two times a day (BID) | ORAL | Status: DC

## 2021-07-13 MED ORDER — CHLORHEXIDINE GLUCONATE CLOTH 2 % EX PADS
6.0000 | MEDICATED_PAD | Freq: Every day | CUTANEOUS | Status: DC
  Administered 2021-07-13 – 2021-08-02 (×20): 6 via TOPICAL

## 2021-07-13 MED ORDER — SODIUM CHLORIDE 0.9% FLUSH
10.0000 mL | INTRAVENOUS | Status: DC | PRN
  Administered 2021-07-17 – 2021-07-28 (×3): 10 mL

## 2021-07-13 MED ORDER — HYDROMORPHONE HCL 1 MG/ML IJ SOLN
0.2500 mg | INTRAMUSCULAR | Status: DC | PRN
  Administered 2021-07-13 – 2021-07-19 (×12): 0.5 mg via INTRAVENOUS
  Administered 2021-07-20: 0.25 mg via INTRAVENOUS
  Administered 2021-07-22: 0.5 mg via INTRAVENOUS
  Administered 2021-07-22: 0.25 mg via INTRAVENOUS
  Administered 2021-07-23 – 2021-07-24 (×4): 0.5 mg via INTRAVENOUS
  Filled 2021-07-13 (×21): qty 0.5

## 2021-07-13 NOTE — Progress Notes (Signed)
° ° ° °  Subjective: Patient admitted to ICU postoperatively given hypotension and continued pressor requirements.  This morning she is normotensive and off of all pressors.  The patient reports pain is well controlled at this point.  She denies distal numbness and tingling.  She has not yet been out of bed.  An adequate specimen this morning for her labs, phlebotomy to return to redraw blood work.  Objective:   VITALS:   Vitals:   07/13/21 0400 07/13/21 0411 07/13/21 0500 07/13/21 0600  BP: (!) 134/56  (!) 120/57 101/81  Pulse:    88  Resp: (!) 23  (!) 25 20  Temp:  99.1 F (37.3 C)    TempSrc:  Oral    SpO2: 96%   91%  Weight:      Height:        Neurovascular intact Sensation intact distally Dorsiflexion/Plantar flexion intact Incision: prevena holding suction, no output in cannister Compartment soft   Lab Results  Component Value Date   WBC 8.7 07/12/2021   HGB 9.2 (L) 07/12/2021   HCT 30.1 (L) 07/12/2021   MCV 67.9 (L) 07/12/2021   PLT 230 07/12/2021   BMET    Component Value Date/Time   NA 137 07/12/2021 0343   K 3.9 07/12/2021 0343   CL 99 07/12/2021 0343   CO2 28 07/12/2021 0343   GLUCOSE 116 (H) 07/12/2021 0343   BUN 19 07/12/2021 0343   CREATININE 1.36 (H) 07/12/2021 0343   CALCIUM 8.7 (L) 07/12/2021 0343   GFRNONAA 41 (L) 07/12/2021 0343      Xray: Post op xrays show knee spacer components and cement dowels in good position without adverse features  Assessment/Plan: 1 Day Post-Op   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  Can be removed when in bed or working with physical therapy Abx: continue ancef per infectious disease recommendations, follow-up Intra-Op cultures and final ID recs Imaging: PACU xrays DVT prophylaxis: Aspirin 81mg  starting POD1-2. Possible resume eliquis POD3 if hemoglobin stable Follow up:1 week after surgery for a wound check with Dr. Zachery Dakins at [facility].  Address: [address] [place], [place], [place] [address]  Office Phone: [phone]   Charlies Constable, MD Orthopaedic Surgery    Toiya Morrish A Bascom 07/13/2021, 7:02 AM   Charlies Constable, MD  Contact information:   [phone] 7am-5pm epic message Dr. Zachery Dakins, or call office for patient follow up: [phone] After hours and holidays please check [email or web address] for group call information for [facility]

## 2021-07-13 NOTE — Progress Notes (Addendum)
NAME:  Danielle [name], MRN:  [id number], DOB:  1946-01-09, LOS: 3 ADMISSION DATE:  07/10/2021, CONSULTATION DATE:  07/12/21 REFERRING MD:  TRH, CHIEF COMPLAINT:  postop   History of Present Illness:  76 year old woman w/ hx of HFpEF, CAD w/ multiple prior PCIs, SSS s/p PPM, Afib on AC, OSA on CPAP, CKD< HTN, DM on insulin p/w recurrent R knee prosthetic infection requiring total explant today with antibiotic spacer placement.  EBL 1000cc.  Given 2.8 L crystalloid.  PCCM consulted for persistent hypotension postop.  Currently patient c/o knee pain, denies dizziness of SOB  Of not she is a practicing Jehovah's witness who is okay with albumin but not blood products.  Daughter is a Financial controller in [employer], present at bedside to corroborate hx.  Pertinent  Medical History   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[place]) 02/05/2017   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 05/20/2018   Acute kidney failure ([place]) 05/15/2018   Arthritis    "all over" (05/22/2018)   Blood dyscrasia    per pt-has small blood cells-appears as if anemic   Breast cancer, left breast ([place]) 1985   CHF (congestive heart failure) (HCC)    Complication of anesthesia    difficult to awaken from per pt   Coronary artery disease    Dyspnea    Essential hypertension    Heart disease    Heart murmur    History of gout    Hypothyroidism    Obesity (BMI 30-39.9) 11/11/2017   OSA (obstructive sleep apnea) 02/18/2017    severe obstructive sleep apnea with an AHI of 75.6/h and mild central sleep apnea with a CAI of 7.7/h.  Oxygen saturations dropped as low as 82%.   He is on CPAP at 9 cm H2O.   OSA on CPAP    Personal history of chemotherapy    Personal history of radiation therapy    Presence of permanent cardiac pacemaker    Primary localized osteoarthritis of right knee 05/13/2018   Refusal of blood transfusions as patient is Jehovah's  Witness    Type II diabetes mellitus ([place])     Significant Hospital Events: Including procedures, antibiotic start and stop dates in addition to other pertinent events   2/13 admitted, cardiology consulted for pre-op risk assessment 2/15 TKA removal  Interim History / Subjective:  Off pressors since ~0300 Patient reports she didn't sleep overnight.  Has refused lab draws except in her feet- awaiting am labs  Objective   Blood pressure 101/81, pulse 88, temperature 99.1 F (37.3 C), temperature source Oral, resp. rate 20, height 5\' 4"  (1.626 m), weight 95.7 kg, SpO2 91 %.        Intake/Output Summary (Last 24 hours) at 07/13/2021 0813 Last data filed at 07/13/2021 0400 Gross per 24 hour  Intake 3165.64 ml  Output 1400 ml  Net 1765.64 ml   Filed Weights   07/12/21 1501  Weight: 95.7 kg    Examination: General:  Elderly female sitting up in bed in NAD HEENT: MM pink/moist, pupils 3/reactive Neuro:  sleepy s/p oxy and dilaudid, at times falls asleep during conversation, slowed verbal, will follow commands in all extremities  CV: rr, NSR PULM:  non labored, CTA - diminished in bases GI: soft, bs+, NT Extremities: warm/dry, R knee immobilizer in place with incisional wound vac, no edema, NV intact Skin: no rashes   Labs pending Afebrile Net +  2.7  Resolved Hospital Problem list   N/a  Assessment & Plan:  Postoperative shock some combination ABLA, anesthesia effect, septic seeding- resolved.  EBL ~1L TKA infection recurrent s/p Explant right total knee arthroplasty with implantation of articulating antibiotic spacer 07/12/21 - Jehovah's witness with refusal of blood products except albumin  P:  - off pressors, s/p albumin and feraheme overnight, remains hemodynamically stable - stable for transfer out of ICU and back to [facility] as of 2/17 - pending CBC/ BMET - restart coreg today, hold for MAP < 65, consider adding back losartan/ spironolactone/ diuretics 2/17 - continue  gabapentin - continue multimodal pain management w/ scheduled bowel regimen - follow culture data, continue ancef for now.  ID is following - Ortho following, PT ordered.   - she has poor vasculature.  One PIV.  Has refused UE IV blood draws.  Hard to get in LE as well.  Will place midline for now  - ID following.  Continue cefazolin for now.     Uncontrolled DMT2-  continue SSI and semglee  CAD/ stents, HTN, systolic/ diastolic HF (EF 76-19%, J0DT, mildly elevated PASP) - continue statin PAF- remains in NSR, Eliquis on hold,  ASA 81mg  daily, may resume eliquis POD3 if Hgb stable CKD stage IIIa- pending BMET.  Holding ARB and torsemide  Hypothyroidism- cont synthroid Anemia of chronic disease- baseline 8-9, Jehovah witness- no blood products.  Pending CBC.   OSA on CPAP  Best Practice (right click and "Reselect all SmartList Selections" daily)   Diet/type: Regular consistency (see orders) DVT prophylaxis: ASA for now, eliquis on hold GI prophylaxis: N/A Lines: N/A Foley:  N/A Code Status:  full code Last date of multidisciplinary goals of care discussion 2/15.  Labs   CBC: Recent Labs  Lab 07/10/21 1404 07/12/21 0343  WBC 9.0 8.7  NEUTROABS  --  5.1  HGB 10.3* 9.2*  HCT 34.3* 30.1*  MCV 69.0* 67.9*  PLT 219 267    Basic Metabolic Panel: Recent Labs  Lab 07/10/21 1404 07/12/21 0343  NA 136 137  K 3.7 3.9  CL 97* 99  CO2 29 28  GLUCOSE 191* 116*  BUN 21 19  CREATININE 1.22* 1.36*  CALCIUM 9.2 8.7*   GFR: Estimated Creatinine Clearance: 40.7 mL/min (A) (by C-G formula based on SCr of 1.36 mg/dL (H)). Recent Labs  Lab 07/10/21 1404 07/12/21 0343  WBC 9.0 8.7    Liver Function Tests: Recent Labs  Lab 07/10/21 1404  AST 36  ALT 33  ALKPHOS 70  BILITOT 0.9  PROT 7.6  ALBUMIN 3.3*   No results for input(s): LIPASE, AMYLASE in the last 168 hours. No results for input(s): AMMONIA in the last 168 hours.  ABG No results found for: PHART, PCO2ART,  PO2ART, HCO3, TCO2, ACIDBASEDEF, O2SAT   Coagulation Profile: No results for input(s): INR, PROTIME in the last 168 hours.  Cardiac Enzymes: No results for input(s): CKTOTAL, CKMB, CKMBINDEX, TROPONINI in the last 168 hours.  HbA1C: Hgb A1c MFr Bld  Date/Time Value Ref Range Status  07/10/2021 02:08 PM 8.2 (H) 4.8 - 5.6 % Final    Comment:    (NOTE) Pre diabetes:          5.7%-6.4%  Diabetes:              >6.4%  Glycemic control for   <7.0% adults with diabetes   04/13/2021 03:16 PM 7.5 (H) 4.8 - 5.6 % Final    Comment:    (NOTE)  Pre diabetes:          5.7%-6.4%  Diabetes:              >6.4%  Glycemic control for   <7.0% adults with diabetes     CBG: Recent Labs  Lab 07/12/21 0739 07/12/21 1157 07/12/21 1809 07/12/21 2130 07/13/21 0636  GLUCAP 126* 124* 138* 207* 177*      Critical care time: N/A     Kennieth Rad, ACNP [facility] 07/13/2021, 8:18 AM  See Amion for pager If no response to pager, please call PCCM consult pager After 7:00 pm call Elink

## 2021-07-13 NOTE — Progress Notes (Signed)
[facility]  Date of Admission:  07/10/2021           Reason for visit: Follow up on prosthetic joint infection  Current antibiotics: Cefazolin 2/13--present   ASSESSMENT:    76 y.o. female admitted with:  Recurrent right knee PJI: Status post initial DAIR procedure in November 2022 with OR cultures at that time growing MSSA.  She received 6 weeks of cefazolin followed by suppressive Keflex.  Infection recurred in setting of suppression plus having ran out of her antibiotics for approximately 1 week prior to admission, however, she was having worsening pain even on Keflex.  She underwent explant and antibiotic spacer on 07/12/21 with orthopedics.  OR cultures currently are NGTD.  Baseline ESR is 64, CRP is 28.2. Postoperative shock: Likely due to blood loss, anesthesia.  Off pressors this morning. Uncontrolled DM: A1c is 8.2. CKD 3: Creatinine clearance currently 40.7 and creatinine 1.36 near baseline. Follow postoperatively in setting of shock. Atrial fibrillation: Patient is on Eliquis for this and rifampin was not used in setting of her prior PJI due to this drug interaction.  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)   Type 2 diabetes mellitus with diabetic polyneuropathy, with long-term current use of insulin (HCC)   Septic arthritis (HCC)   Paroxysmal atrial fibrillation (HCC)   CKD (chronic kidney disease), stage III (HCC)   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)   MSSA (methicillin susceptible Staphylococcus aureus) infection   CHF  (congestive heart failure) (HCC)    MEDICATIONS:    Scheduled Meds:  aspirin EC  81 mg Oral Daily   atorvastatin  80 mg Oral QHS   Chlorhexidine Gluconate Cloth  6 each Topical Daily   empagliflozin  10 mg Oral Daily   fentaNYL       fentaNYL       gabapentin  300 mg Oral BID   insulin aspart  0-15 Units Subcutaneous TID WC   insulin aspart  0-5 Units Subcutaneous QHS   insulin glargine-yfgn  40 Units Subcutaneous QHS   levothyroxine  75 mcg Oral Q0600   senna  1 tablet Oral BID   Continuous Infusions:  sodium chloride 250 mL (07/13/21 0209)   albumin human 25 g (07/13/21 0527)    ceFAZolin (ANCEF) IV 2 g (07/13/21 0527)   lactated ringers     lactated ringers 10 mL/hr at 07/12/21 1516   methocarbamol (ROBAXIN) IV 500 mg (07/13/21 0609)   norepinephrine (LEVOPHED) Adult infusion Stopped (07/13/21 0324)   PRN Meds:.acetaminophen, bisacodyl, diphenhydrAMINE, HYDROmorphone (DILAUDID) injection, methocarbamol **OR** methocarbamol (ROBAXIN) IV, ondansetron **OR** ondansetron (ZOFRAN) IV, oxyCODONE, oxyCODONE, polyethylene glycol, sodium phosphate  SUBJECTIVE:   24 hour events:  Patient admitted to ICU postoperatively secondary to hypotension and need for pressors. This morning she is off pressors and maintaining her blood pressure. Status post right knee prosthetic joint explantation with antibiotic spacer placed. Afebrile, Tmax 99.7 No labs back this morning as of yet OR cultures are pending with negative Gram stain's at this time.  Sleeping in bed No fevers, chills Has knee pain No  other complaints  Review of Systems  All other systems reviewed and are negative.    OBJECTIVE:   Blood pressure 101/81, pulse 88, temperature 99.1 F (37.3 C), temperature source Oral, resp. rate 20, height 5' 4"  (1.626 m), weight 95.7 kg, SpO2 91 %. Body mass index is 36.22 kg/m.  Physical Exam Constitutional:      Comments: Sleeping, no acute distress  HENT:     Head:  Normocephalic and atraumatic.  Pulmonary:     Effort: Pulmonary effort is normal. No respiratory distress.  Abdominal:     General: There is no distension.     Palpations: Abdomen is soft.     Tenderness: There is no abdominal tenderness.  Musculoskeletal:     Comments: Right knee in brace.  Wound vac in place.   Skin:    General: Skin is warm and dry.  Neurological:     General: No focal deficit present.     Mental Status: Mental status is at baseline.  Psychiatric:        Mood and Affect: Mood normal.        Behavior: Behavior normal.     Lab Results: Lab Results  Component Value Date   WBC 8.7 07/12/2021   HGB 9.2 (L) 07/12/2021   HCT 30.1 (L) 07/12/2021   MCV 67.9 (L) 07/12/2021   PLT 230 07/12/2021    Lab Results  Component Value Date   NA 137 07/12/2021   K 3.9 07/12/2021   CO2 28 07/12/2021   GLUCOSE 116 (H) 07/12/2021   BUN 19 07/12/2021   CREATININE 1.36 (H) 07/12/2021   CALCIUM 8.7 (L) 07/12/2021   GFRNONAA 41 (L) 07/12/2021   GFRAA 33 (L) 01/21/2020    Lab Results  Component Value Date   ALT 33 07/10/2021   AST 36 07/10/2021   ALKPHOS 70 07/10/2021   BILITOT 0.9 07/10/2021       Component Value Date/Time   CRP 28.2 (H) 07/10/2021 1404       Component Value Date/Time   ESRSEDRATE 64 (H) 07/10/2021 1404     I have reviewed the micro and lab results in Epic.  Imaging: DG Knee Right Port  Result Date: 07/12/2021 CLINICAL DATA:  Postoperative knee replacement. EXAM: PORTABLE RIGHT KNEE - 1-2 VIEW COMPARISON:  04/14/2021 FINDINGS: There is interval removal of the prior tibial plateau hardware and prior femoral condyle hardware. There is new cement related to new stem tunnels within the distal femur and proximal tibia. No metallic tibial prosthesis material is currently visualized. There is scattered bone density with chronic well corticated edges overlying the peripheral medial and lateral compartments. Severe patellofemoral joint space narrowing.  Likely wound VAC drain overlies the proximal anterior knee. Mild soft tissue swelling and subcutaneous air consistent with recent surgery. Dense vascular calcifications. IMPRESSION: Interval recent revision of right knee arthroplasty hardware. No evidence of hardware failure. Electronically Signed   By: Yvonne Kendall M.D.   On: 07/12/2021 22:25   VAS [place] LOWER EXTREMITY VENOUS (DVT)  Result Date: 07/11/2021  Lower Venous DVT Study Patient Name:  Danielle [name]  Date of Exam:   07/11/2021 Medical Rec #: [id number]      Accession #:    [id number] Date of Birth: February 22, 1946      Patient Gender: F Patient Age:   70 years Exam Location:  [facility] Procedure:      VAS [place] LOWER EXTREMITY VENOUS (DVT) Referring Phys: Wynetta Fines --------------------------------------------------------------------------------  Indications: Swelling.  Comparison Study:  04/13/21 prior Performing Technologist: Archie Patten RVS  Examination Guidelines: A complete evaluation includes B-mode imaging, spectral Doppler, color Doppler, and power Doppler as needed of all accessible portions of each vessel. Bilateral testing is considered an integral part of a complete examination. Limited examinations for reoccurring indications may be performed as noted. The reflux portion of the exam is performed with the patient in reverse Trendelenburg.  +---------+---------------+---------+-----------+----------+---------------+  RIGHT     Compressibility Phasicity Spontaneity Properties Thrombus Aging   +---------+---------------+---------+-----------+----------+---------------+  CFV       Full            Yes       Yes                                     +---------+---------------+---------+-----------+----------+---------------+  SFJ       Full                                                              +---------+---------------+---------+-----------+----------+---------------+  FV Prox   Full                                                               +---------+---------------+---------+-----------+----------+---------------+  FV Mid    Full                                                              +---------+---------------+---------+-----------+----------+---------------+  FV Distal Full                                                              +---------+---------------+---------+-----------+----------+---------------+  PFV       Full                                                              +---------+---------------+---------+-----------+----------+---------------+  POP       Full            Yes       Yes                                     +---------+---------------+---------+-----------+----------+---------------+  PTV       Full                                                              +---------+---------------+---------+-----------+----------+---------------+  PERO                                                       patent by color  +---------+---------------+---------+-----------+----------+---------------+   +----+---------------+---------+-----------+----------+--------------+  LEFT Compressibility Phasicity Spontaneity Properties Thrombus Aging  +----+---------------+---------+-----------+----------+--------------+  CFV  Full            Yes       Yes                                    +----+---------------+---------+-----------+----------+--------------+     Summary: RIGHT: - There is no evidence of deep vein thrombosis in the lower extremity.  - No cystic structure found in the popliteal fossa.  LEFT: - No evidence of common femoral vein obstruction.  *See table(s) above for measurements and observations. Electronically signed by Deitra Mayo MD on 07/11/2021 at 4:11:38 PM.    Final      Imaging independently reviewed in Epic.    [facility] for Infectious [facility] [phone] pager 07/13/2021, 8:07 AM

## 2021-07-13 NOTE — Progress Notes (Signed)
Report called to receiving RN [id number]. RN informed to get CBC once PICC line placed. Patient with no complaints at the current time. Varney Biles, patients daughter informed of new room. Will transfer via bed.

## 2021-07-13 NOTE — Progress Notes (Signed)
Peripherally Inserted Central Catheter Placement  The IV Nurse has discussed with the patient and/or persons authorized to consent for the patient, the purpose of this procedure and the potential benefits and risks involved with this procedure.  The benefits include less needle sticks, lab draws from the catheter, and the patient may be discharged home with the catheter. Risks include, but not limited to, infection, bleeding, blood clot (thrombus formation), and puncture of an artery; nerve damage and irregular heartbeat and possibility to perform a PICC exchange if needed/ordered by physician.  Alternatives to this procedure were also discussed.  Bard Power PICC patient education guide, fact sheet on infection prevention and patient information card has been provided to patient /or left at bedside.    PICC Placement Documentation  PICC Single Lumen 07/13/21 Right Brachial 38 cm 0 cm (Active)  Indication for Insertion or Continuance of Line Home intravenous therapies (PICC only) 07/13/21 2224  Exposed Catheter (cm) 0 cm 07/13/21 2224  Site Assessment Clean, Dry, Intact 07/13/21 2224  Line Status Flushed;Blood return noted;Saline locked 07/13/21 2224  Dressing Type Securing device;Transparent 07/13/21 2224  Dressing Status Antimicrobial disc in place;Clean, Dry, Intact 07/13/21 2224  Safety Lock Not Applicable 27/61/47 0929  Line Care Connections checked and tightened 07/13/21 2224  Dressing Intervention New dressing 07/13/21 2224  Dressing Change Due 07/20/21 07/13/21 2224       Yandell Mcjunkins Ortiz 07/13/2021, 10:27 PM

## 2021-07-13 NOTE — Progress Notes (Signed)
eLink Physician-Brief Progress Note Patient Name: Danielle [name] DOB: Dec 31, 1945 MRN: [id number]   Date of Service  07/13/2021  HPI/Events of Note  Patient refuses lab sticks in her arms, however, will allow lab to stick feet.   eICU Interventions  Plan: May stick feet for lab draws.      Intervention Category Major Interventions: Other:  Lysle Dingwall 07/13/2021, 6:35 AM

## 2021-07-13 NOTE — Progress Notes (Addendum)
0700 - Report received from Wille Glaser, Therapist, sports.  All questions answered.  Safety checks performed.  Lines and drips verified. Hand hygiene performed before/after each pt contact. 0800 Nevin Bloodgood, NP rounded. 0815 - Lab arrived to draw labs.  Pt refused arm stick, saying she wanted them to stick her in the foot.  Nevin Bloodgood, NP okayed them to stick her foot. 0930 - Lab called to advise the blood collected had clotted.  Blood unable to be collected a second time.  Nevin Bloodgood, NP notified.  She ordered a midline to be placed.   1030 - Morning rounds. 1045 - Dr. Lynetta Mare rounded. 1100 - Report given to Clarene Critchley, Therapist, sports.  All questions answered.

## 2021-07-14 ENCOUNTER — Inpatient Hospital Stay (HOSPITAL_COMMUNITY): Payer: Medicare HMO

## 2021-07-14 DIAGNOSIS — T8453XD Infection and inflammatory reaction due to internal right knee prosthesis, subsequent encounter: Secondary | ICD-10-CM

## 2021-07-14 DIAGNOSIS — D649 Anemia, unspecified: Secondary | ICD-10-CM

## 2021-07-14 DIAGNOSIS — N1832 Chronic kidney disease, stage 3b: Secondary | ICD-10-CM | POA: Diagnosis not present

## 2021-07-14 DIAGNOSIS — I5032 Chronic diastolic (congestive) heart failure: Secondary | ICD-10-CM | POA: Diagnosis not present

## 2021-07-14 DIAGNOSIS — T8453XA Infection and inflammatory reaction due to internal right knee prosthesis, initial encounter: Secondary | ICD-10-CM | POA: Diagnosis not present

## 2021-07-14 DIAGNOSIS — D62 Acute posthemorrhagic anemia: Secondary | ICD-10-CM | POA: Diagnosis not present

## 2021-07-14 DIAGNOSIS — J690 Pneumonitis due to inhalation of food and vomit: Secondary | ICD-10-CM

## 2021-07-14 DIAGNOSIS — A4901 Methicillin susceptible Staphylococcus aureus infection, unspecified site: Secondary | ICD-10-CM | POA: Diagnosis not present

## 2021-07-14 DIAGNOSIS — E1122 Type 2 diabetes mellitus with diabetic chronic kidney disease: Secondary | ICD-10-CM | POA: Diagnosis not present

## 2021-07-14 LAB — ABO/RH: ABO/RH(D): A POS

## 2021-07-14 LAB — CBC WITH DIFFERENTIAL/PLATELET
Abs Immature Granulocytes: 0.08 10*3/uL — ABNORMAL HIGH (ref 0.00–0.07)
Basophils Absolute: 0 10*3/uL (ref 0.0–0.1)
Basophils Relative: 0 %
Eosinophils Absolute: 0 10*3/uL (ref 0.0–0.5)
Eosinophils Relative: 0 %
HCT: 13.4 % — ABNORMAL LOW (ref 36.0–46.0)
Hemoglobin: 4.1 g/dL — CL (ref 12.0–15.0)
Immature Granulocytes: 1 %
Lymphocytes Relative: 23 %
Lymphs Abs: 2.1 10*3/uL (ref 0.7–4.0)
MCH: 21 pg — ABNORMAL LOW (ref 26.0–34.0)
MCHC: 30.6 g/dL (ref 30.0–36.0)
MCV: 68.7 fL — ABNORMAL LOW (ref 80.0–100.0)
Monocytes Absolute: 1.1 10*3/uL — ABNORMAL HIGH (ref 0.1–1.0)
Monocytes Relative: 12 %
Neutro Abs: 5.9 10*3/uL (ref 1.7–7.7)
Neutrophils Relative %: 64 %
Platelets: 176 10*3/uL (ref 150–400)
RBC: 1.95 MIL/uL — ABNORMAL LOW (ref 3.87–5.11)
RDW: 15.5 % (ref 11.5–15.5)
WBC: 9.2 10*3/uL (ref 4.0–10.5)
nRBC: 0.6 % — ABNORMAL HIGH (ref 0.0–0.2)

## 2021-07-14 LAB — GLUCOSE, CAPILLARY
Glucose-Capillary: 161 mg/dL — ABNORMAL HIGH (ref 70–99)
Glucose-Capillary: 190 mg/dL — ABNORMAL HIGH (ref 70–99)
Glucose-Capillary: 160 mg/dL — ABNORMAL HIGH (ref 70–99)
Glucose-Capillary: 159 mg/dL — ABNORMAL HIGH (ref 70–99)

## 2021-07-14 LAB — VITAMIN B12: Vitamin B-12: 591 pg/mL (ref 180–914)

## 2021-07-14 LAB — PREPARE RBC (CROSSMATCH)

## 2021-07-14 LAB — PROCALCITONIN: Procalcitonin: 0.74 ng/mL

## 2021-07-14 LAB — BASIC METABOLIC PANEL
Anion gap: 9 (ref 5–15)
BUN: 31 mg/dL — ABNORMAL HIGH (ref 8–23)
CO2: 28 mmol/L (ref 22–32)
Calcium: 8.1 mg/dL — ABNORMAL LOW (ref 8.9–10.3)
Chloride: 100 mmol/L (ref 98–111)
Creatinine, Ser: 1.59 mg/dL — ABNORMAL HIGH (ref 0.44–1.00)
GFR, Estimated: 34 mL/min — ABNORMAL LOW (ref >60)
Glucose, Bld: 144 mg/dL — ABNORMAL HIGH (ref 70–99)
Potassium: 4.3 mmol/L (ref 3.5–5.1)
Sodium: 137 mmol/L (ref 135–145)

## 2021-07-14 LAB — RETICULOCYTES
Immature Retic Fract: 31 % — ABNORMAL HIGH (ref 2.3–15.9)
RBC.: 2.16 MIL/uL — ABNORMAL LOW (ref 3.87–5.11)
Retic Count, Absolute: 26.1 10*3/uL (ref 19.0–186.0)
Retic Ct Pct: 1.2 % (ref 0.4–3.1)

## 2021-07-14 LAB — CBC
HCT: 15.4 % — ABNORMAL LOW (ref 36.0–46.0)
Hemoglobin: 4.9 g/dL — CL (ref 12.0–15.0)
MCH: 23 pg — ABNORMAL LOW (ref 26.0–34.0)
MCHC: 31.8 g/dL (ref 30.0–36.0)
MCV: 72.3 fL — ABNORMAL LOW (ref 80.0–100.0)
Platelets: 193 10*3/uL (ref 150–400)
RBC: 2.13 MIL/uL — ABNORMAL LOW (ref 3.87–5.11)
RDW: 18.6 % — ABNORMAL HIGH (ref 11.5–15.5)
WBC: 9.4 10*3/uL (ref 4.0–10.5)
nRBC: 1.5 % — ABNORMAL HIGH (ref 0.0–0.2)

## 2021-07-14 LAB — FERRITIN: Ferritin: 3925 ng/mL — ABNORMAL HIGH (ref 11–307)

## 2021-07-14 LAB — IRON AND TIBC
Iron: 107 ug/dL (ref 28–170)
Saturation Ratios: 84 % — ABNORMAL HIGH (ref 10.4–31.8)
TIBC: 127 ug/dL — ABNORMAL LOW (ref 250–450)
UIBC: 20 ug/dL

## 2021-07-14 LAB — FOLATE: Folate: 33.9 ng/mL (ref >5.9)

## 2021-07-14 LAB — HEMOGLOBIN AND HEMATOCRIT, BLOOD
HCT: 13.1 % — ABNORMAL LOW (ref 36.0–46.0)
Hemoglobin: 4.1 g/dL — CL (ref 12.0–15.0)

## 2021-07-14 MED ORDER — DARBEPOETIN ALFA 25 MCG/0.42ML IJ SOSY
150.0000 ug | PREFILLED_SYRINGE | Freq: Once | INTRAMUSCULAR | Status: DC
  Filled 2021-07-14: qty 2.52

## 2021-07-14 MED ORDER — ALBUMIN HUMAN 5 % IV SOLN
25.0000 g | Freq: Once | INTRAVENOUS | Status: AC
  Administered 2021-07-14: 25 g via INTRAVENOUS
  Filled 2021-07-14: qty 500

## 2021-07-14 MED ORDER — DARBEPOETIN ALFA 150 MCG/0.3ML IJ SOSY
150.0000 ug | PREFILLED_SYRINGE | Freq: Once | INTRAMUSCULAR | Status: AC
  Administered 2021-07-14: 150 ug via SUBCUTANEOUS
  Filled 2021-07-14: qty 0.3

## 2021-07-14 MED ORDER — ALBUMIN HUMAN 25 % IV SOLN
25.0000 g | Freq: Once | INTRAVENOUS | Status: AC
  Administered 2021-07-14: 25 g via INTRAVENOUS

## 2021-07-14 MED ORDER — ALBUMIN HUMAN 25 % IV SOLN
25.0000 g | Freq: Once | INTRAVENOUS | Status: DC
  Filled 2021-07-14: qty 100

## 2021-07-14 MED ORDER — ORAL CARE MOUTH RINSE
15.0000 mL | Freq: Two times a day (BID) | OROMUCOSAL | Status: DC
  Administered 2021-07-14 – 2021-08-02 (×34): 15 mL via OROMUCOSAL

## 2021-07-14 NOTE — Progress Notes (Addendum)
PROGRESS NOTE    Danielle [name]  NKN:[id number] DOB: 12/31/1945 DOA: 07/10/2021 PCP: Seward Carol, MD    Brief Narrative:  Danielle [name] is a 76 year old female with past medical history significant for bilateral knee replacements with recent recurrent right knee septic arthritis, essential hypertension, CAD s/p PCI/stent, combined chronic systolic/diastolic congestive heart failure, CKD stage IIIa, history of left breast cancer, OSA on CPAP, type 2 diabetes mellitus, morbid obesity, gout, Jehovah's Witness who refuses blood products who presented to [facility] ED on 2/13 for evaluation of failed antibiotic treatment of right knee recurrent septic arthritis.  Patient initially undergone right TKA with Dr. Mardelle Matte December 2019 with development of acute pain/swelling of the right knee in November 2022 in which she was admitted to the hospital and diagnosed with a staph infection in her prosthetic knee.  She underwent irrigation debridement with liner exchange on April 14, 2021 and postoperatively treated with IV antibiotics with cefazolin with transition to Keflex thereafter.  Now patient over the last 2-3 weeks with progressive pain and difficulty ambulating with subjective fevers.     Assessment & Plan:   Assessment and Plan: * Septic Arthritis, Infection of prosthetic right knee joint ([place]), MSSA infection Patient presenting to ED following failed outpatient treatment with antibiotics for recurrent right TKA prosthetic infection with MSSA.  Patient underwent explant right total knee arthroplasty with implantation of articulating antibiotic spacer by Dr. Zachery Dakins on 07/12/2021.    --Orthopedics infectious disease following, appreciate assistance --Cefazolin per infectious disease; PICC line placed  Anemia Patient transferred from the intensive care unit on 07/13/2021 to the floor.  Hemoglobin was noted to drop from 9.2 on 2/16 to 4.1 this morning, confirmed on repeat H&H.  Patient also appeared  to be confused, lethargic, hypotensive with MAPs <65.  Patient continued to refuse blood products this morning but still insisted on being a full code.  Discussed with patient's daughter and given her instability; will require return to the ICU given she wishes to be a full code still.  PCCM reconsulted and transferred back to the intensive care unit.  PCCM team discussed with daughter who overrode patient's wishes due to her inability to make appropriate medical decisions due to her confusion/lethargy and severe anemia and was transfused 1 unit PRBC.  Patient was also given IV albumin.  Overall very poor prognosis given her cardiac history and severe anemia, and unless she agrees to aggressive management with blood transfusion; she may suffer cardiac arrest; which was extensively explained to both the patient and daughter. -- Continue encourage blood transfusion, although suspect she will continue to refuse -- Repeat CBC post transfusion up to 4.9. 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  Home medication include torsemide 40 mg p.o. daily, spironolactone 12.5 mg p.o. daily, losartan 25 mg p.o. daily, carvedilol 12.5 mg p.o. twice daily. --Hold antihypertensives in the setting of hypotension likely secondary to hypovolemia in the setting of severe anemia as above. --Continue Jardiance 10 mg p.o. daily --Strict I's and O's and daily weights  Essential hypertension- (present on admission) Home regimen  includes  torsemide 40 mg p.o. daily, spironolactone 12.5 mg p.o. daily, losartan 25 mg p.o. daily, carvedilol 12.5 mg p.o. twice daily.  Given her hypotension in the setting of severe anemia holding antihypertensives and supporting with IV albumin infusions. -- Continue to monitor BP closely  Type 2 diabetes mellitus with diabetic polyneuropathy, with long-term current use of insulin (HCC) Home regimen includes insulin glargine 40 units subcutaneously daily, Humalog insulin sliding scale.  Hemoglobin A1c 07/10/2021 8.2, poorly controlled. --Semglee 40 units subcutaneously daily --Moderate SSI for coverage --CBG before every meal/at bedtime  Paroxysmal atrial fibrillation ([place])- (present on admission) Patient on carvedilol 12.5 mg p.o. twice daily and Eliquis at home.  Given her severe anemia, holding anticoagulation as well as antihypertensives due to hypotension. --Continue monitor on telemetry  Obesity (BMI 30-39.9)- (present on admission) Body mass index is 36.22 kg/m.  Discussed with patient needs for aggressive lifestyle changes/weight loss as this complicates all facets of care.  Outpatient follow-up with PCP.   Refusal of blood transfusions as patient is Jehovah's Witness Baseline hemoglobin 8-9.  Continues to adamantly refuse blood transfusions.     DVT prophylaxis: SCDs Start: 07/10/21 1254    Code Status: Full Code Family Communication: Updated patient's daughter via telephone this morning  Disposition Plan:  Level of care: ICU Status is: Inpatient Remains inpatient appropriate because: Patient needs transfer back to ICU with severe anemia, refusing blood products with continued wishes of aggressive care otherwise as being full code.  Discussed with daughter who agrees with high concern of significantly compromised/cardiac arrest given her underlying cardiac issues.  PCCM reconsulted, transferring back to ICU today.    Consultants:  Orthopedics Infectious  disease PCCM  Procedures:  Explant right total knee arthroplasty with antibiotic spacer, Dr. Zachery Dakins, 2/15  Antimicrobials:  Cefazolin   Subjective: Patient seen and examined at bedside, sleeping.  Lethargic/somnolent and confused.  Patient's hemoglobin dropped to 4.1 from 9.2 yesterday and notably hypotensive with MAP less than 65.  No obvious signs of active bleeding.  Patient is a Sales promotion account executive Witness and continues to refuse blood transfusion.  Discussed with daughter over the telephone, concern for impending cardiac arrest given severe anemia.  Reconsulted PCCM as patient is unstable to continue management on the medical floor given her continued wishes for aggressive care otherwise as being full code and will transfer back to the intensive care unit.  Updated RN at bedside.  Discussed with orthopedics via telephone this morning.  Objective: Vitals:   07/14/21 1300 07/14/21 1400 07/14/21 1500 07/14/21 1600  BP: (!) 110/49 (!) 105/44  (!) 107/33  Pulse: 83 82 80 85  Resp: (!) 28 (!) 26 (!) 24 (!) 24  Temp:      TempSrc:      SpO2: 96% 98% 100% 100%  Weight:      Height:        Intake/Output Summary (Last 24 hours) at 07/14/2021 1642 Last data filed at 07/14/2021 0900 Gross per 24 hour  Intake 196.57 ml  Output 400 ml  Net -203.43 ml   Filed Weights   07/12/21 1501  Weight: 95.7 kg    Examination:  Physical Exam: GEN: NAD, somnolent, confused, lethargic, chronically ill in appearance, appears older than stated age HEENT: NCAT, PERRL, EOMI, sclera clear, MMM PULM: CTAB w/o wheezes/crackles, normal respiratory effort, on room air CV: RRR w/o M/G/R GI: abd soft, NTND, NABS, no R/G/M MSK: no peripheral edema, right knee with immobilizer/wound VAC noted, moves all extremities independently otherwise NEURO: Nonfocal PSYCH: Depressed  mood, flat affect Integumentary: Right knee with immobilizer and wound VAC in place    Data Reviewed: I have personally reviewed following  labs and imaging studies  CBC: Recent Labs  Lab 07/10/21 1404 07/12/21 0343 07/14/21 0407 07/14/21 0640 07/14/21 1439  WBC 9.0 8.7 9.2  --  9.4  NEUTROABS  --  5.1 5.9  --   --   HGB 10.3* 9.2* 4.1* 4.1* 4.9*  HCT 34.3* 30.1* 13.4* 13.1* 15.4*  MCV 69.0* 67.9* 68.7*  --  72.3*  PLT 219 230 176  --  737   Basic Metabolic Panel: Recent Labs  Lab 07/10/21 1404 07/12/21 0343 07/13/21 0834 07/14/21 0407  NA 136 137 137 137  K 3.7 3.9 4.8 4.3  CL 97* 99 101 100  CO2 29 28 26 28   GLUCOSE 191* 116* 176* 144*  BUN 21 19 25* 31*  CREATININE 1.22* 1.36* 1.41* 1.59*  CALCIUM 9.2 8.7* 7.9* 8.1*   GFR: Estimated Creatinine Clearance: 34.8 mL/min (A) (by C-G formula based on SCr of 1.59 mg/dL (H)). Liver Function Tests: Recent Labs  Lab 07/10/21 1404  AST 36  ALT 33  ALKPHOS 70  BILITOT 0.9  PROT 7.6  ALBUMIN 3.3*   No results for input(s): LIPASE, AMYLASE in the last 168 hours. No results for input(s): AMMONIA in the last 168 hours. Coagulation Profile: No results for input(s): INR, PROTIME in the last 168 hours. Cardiac Enzymes: No results for input(s): CKTOTAL, CKMB, CKMBINDEX, TROPONINI in the last 168 hours. BNP (last 3 results) No results for input(s): PROBNP in the last 8760 hours. HbA1C: No results for input(s): HGBA1C in the last 72 hours. CBG: Recent Labs  Lab 07/13/21 1659 07/13/21 2304 07/14/21 0747 07/14/21 1140 07/14/21 1617  GLUCAP 142* 128* 161* 190* 160*   Lipid Profile: No results for input(s): CHOL, HDL, LDLCALC, TRIG, CHOLHDL, LDLDIRECT in the last 72 hours. Thyroid Function Tests: No results for input(s): TSH, T4TOTAL, FREET4, T3FREE, THYROIDAB in the last 72 hours. Anemia Panel: Recent Labs    07/14/21 1439  RETICCTPCT 1.2   Sepsis Labs: Recent Labs  Lab 07/14/21 1439  PROCALCITON 0.74    Recent Results (from the past 240 hour(s))  Surgical pcr screen     Status: None   Collection Time: 07/11/21  3:28 PM   Specimen:  Nasopharyngeal Swab; Nasal Swab  Result Value Ref Range Status   MRSA, PCR NEGATIVE NEGATIVE Final   Staphylococcus aureus NEGATIVE NEGATIVE Final    Comment: (NOTE) The Xpert SA Assay (FDA approved for NASAL specimens in patients 62 years of age and older), is one component of a comprehensive surveillance program. It is not intended to diagnose infection nor to guide or monitor treatment. Performed at [facility], [place] [address] [place], [place] [address]   SARS CORONAVIRUS 2 (TAT 6-24 HRS) Nasopharyngeal Nasopharyngeal Swab     Status: None   Collection Time: 07/11/21  3:42 PM   Specimen: Nasopharyngeal Swab  Result Value Ref Range Status   SARS Coronavirus 2 NEGATIVE NEGATIVE Final    Comment: (NOTE) SARS-CoV-2 target nucleic acids are NOT DETECTED.  The SARS-CoV-2 RNA is generally detectable in upper and lower respiratory specimens during the acute phase of infection. Negative results do not preclude SARS-CoV-2 infection, do not rule out co-infections with other pathogens, and should not be used as the sole basis for treatment or other patient management decisions. Negative results must be combined with clinical observations, patient history, and epidemiological information. The  expected result is Negative.  Fact Sheet for Patients: SugarRoll.be  Fact Sheet for Healthcare Providers: https://www.woods-mathews.com/  This test is not yet approved or cleared by the [place] FDA and  has been authorized for detection and/or diagnosis of SARS-CoV-2 by FDA under an Emergency Use Authorization (EUA). This EUA will remain  in effect (meaning this test can be used) for the duration of the COVID-19 declaration under Se ction 564(b)(1) of the Act, 21 U.S.C. section 360bbb-3(b)(1), unless the authorization is terminated or revoked sooner.  Performed at [facility], [place] [address], [place], [place] [address]    Aerobic/Anaerobic Culture w Gram Stain (surgical/deep wound)     Status: None (Preliminary result)   Collection Time: 07/12/21  5:39 PM   Specimen: Soft Tissue, Other  Result Value Ref Range Status   Specimen Description TISSUE  Final   Special Requests RIGHT KNEE  Final   Gram Stain   Final    ABUNDANT WBC PRESENT,BOTH PMN AND MONONUCLEAR NO ORGANISMS SEEN Performed at [facility], [address] [place], [place] [address]    Culture   Final    RARE STAPHYLOCOCCUS AUREUS NOTIFIED DR. Juleen China VIA EPIC CHAT OF CULTURE GROWTH, AT 1048 07/14/21 D. VANHOOK NO ANAEROBES ISOLATED; CULTURE IN PROGRESS FOR 5 DAYS    Report Status PENDING  Incomplete  Aerobic/Anaerobic Culture w Gram Stain (surgical/deep wound)     Status: None (Preliminary result)   Collection Time: 07/12/21  5:40 PM   Specimen: Soft Tissue, Other  Result Value Ref Range Status   Specimen Description TISSUE  Final   Special Requests RIGHT KNEE  Final   Gram Stain   Final    ABUNDANT WBC PRESENT,BOTH PMN AND MONONUCLEAR NO ORGANISMS SEEN    Culture   Final    CULTURE REINCUBATED FOR BETTER GROWTH Performed at [facility], [place] [address], [place], [place] [address]    Report Status PENDING  Incomplete  Aerobic/Anaerobic Culture w Gram Stain (surgical/deep wound)     Status: None (Preliminary result)   Collection Time: 07/12/21  5:40 PM   Specimen: Soft Tissue, Other  Result Value Ref Range Status   Specimen Description TISSUE  Final   Special Requests RIGHT KNEE  Final   Gram Stain   Final    FEW WBC PRESENT,BOTH PMN AND MONONUCLEAR NO ORGANISMS SEEN Performed at [facility], [address], [place], [place] [address]    Culture   Final    RARE STAPHYLOCOCCUS AUREUS SUSCEPTIBILITIES TO FOLLOW NO ANAEROBES ISOLATED; CULTURE IN PROGRESS FOR 5 DAYS    Report Status PENDING  Incomplete  MRSA Next Gen by PCR, Nasal     Status: None   Collection Time: 07/13/21  4:16 AM   Specimen: Nasal  Mucosa; Nasal Swab  Result Value Ref Range Status   MRSA by PCR Next Gen NOT DETECTED NOT DETECTED Final    Comment: (NOTE) The GeneXpert MRSA Assay (FDA approved for NASAL specimens only), is one component of a comprehensive MRSA colonization surveillance program. It is not intended to diagnose MRSA infection nor to guide or monitor treatment for MRSA infections. Test performance is not FDA approved in patients less than 32 years old. Performed at [facility], [place] [address] [place],  [address]          Radiology Studies: DG CHEST PORT 1 VIEW  Result Date: 07/14/2021 CLINICAL DATA:  Pneumonia EXAM: PORTABLE CHEST 1 VIEW COMPARISON:  Radiograph 07/10/2020 FINDINGS: Unchanged dual chamber pacemaker leads. Unchanged cardiomediastinal silhouette. Bibasilar opacities favored to be atelectasis. No new airspace disease. No large pleural effusion. No visible pneumothorax. No acute osseous abnormality. IMPRESSION: Bibasilar opacities favored to be atelectasis, developing infection possible. Recommend radiographic follow-up. Electronically Signed   By: Maurine Simmering M.D.   On: 07/14/2021 12:01   DG Knee Right Port  Result Date: 07/12/2021 CLINICAL DATA:  Postoperative knee replacement. EXAM: PORTABLE RIGHT KNEE - 1-2 VIEW COMPARISON:  04/14/2021 FINDINGS: There is interval removal of the prior tibial plateau hardware and prior femoral condyle hardware. There is new cement related to new stem tunnels within the distal femur and proximal tibia. No metallic tibial prosthesis material is currently visualized. There is scattered bone density with chronic well corticated edges overlying the peripheral medial and lateral compartments. Severe patellofemoral joint space narrowing. Likely wound VAC drain overlies the proximal anterior knee. Mild soft tissue swelling and subcutaneous air consistent with recent surgery. Dense vascular calcifications. IMPRESSION: Interval recent revision of right  knee arthroplasty hardware. No evidence of hardware failure. Electronically Signed   By: Yvonne Kendall M.D.   On: 07/12/2021 22:25   [place] EKG SITE RITE  Result Date: 07/13/2021 If Site Rite image not attached, placement could not be confirmed due to current cardiac rhythm.       Scheduled Meds:  atorvastatin  80 mg Oral QHS   Chlorhexidine Gluconate Cloth  6 each Topical Daily   Darbepoetin Alfa  150 mcg Subcutaneous Once   empagliflozin  10 mg Oral Daily   insulin aspart  0-15 Units Subcutaneous TID WC   insulin aspart  0-5 Units Subcutaneous QHS   insulin glargine-yfgn  40 Units Subcutaneous QHS   levothyroxine  75 mcg Oral Q0600   senna  1 tablet Oral BID   sodium chloride flush  10-40 mL Intracatheter Q12H   Continuous Infusions:  sodium chloride 250 mL (07/13/21 0209)   albumin human      ceFAZolin (ANCEF) IV 2 g (07/14/21 1458)   lactated ringers     lactated ringers 10 mL/hr at 07/12/21 1516     LOS: 4 days    Critical Care Time Upon my evaluation, this patient had a high probability of imminent or life-threatening deterioration due to severe anemia with associated hypotension, which required my direct attention, intervention, and personal management.  I have personally provided 59 minutes of critical care time exclusive of my time spent on separately billable procedures.  Time includes review of laboratory data, radiology results, discussion with consultants, and monitoring for potential decompensation.       Sullivan Blasing J [place] DO [facility] Available via Epic secure chat 7am-7pm After these hours, please refer to coverage provider listed on [email or web address] 07/14/2021, 4:42 PM

## 2021-07-14 NOTE — Assessment & Plan Note (Addendum)
Estimated body mass index is 37.27 kg/m as calculated from the following:   Height as of this encounter: 5\' 4"  (1.626 m).   Weight as of this encounter: 98.5 kg. Patient would benefit from weight loss as outpatient.

## 2021-07-14 NOTE — TOC Initial Note (Signed)
Transition of Care [facility]) - Initial/Assessment Note    Patient Details  Name: Angelli Baruch MRN: [id number] Date of Birth: [birth year]  Transition of Care [facility]) CM/SW Contact:    Angelita Ingles, RN Phone Number:[phone]  07/14/2021, 4:20 PM  Clinical Narrative:                  Transition of Care [facility]) Screening Note   Patient Details  Name: Patrina Andreas Date of Birth: [birth year]   Transition of Care [facility]) CM/SW Contact:    Angelita Ingles, RN Phone Number:[phone]  07/14/2021, 4:20 PM    Transition of Care Department [facility]) has reviewed patient and no TOC needs have been identified at this time. We will continue to monitor patient advancement through interdisciplinary progression rounds. If new patient transition needs arise, please place a TOC consult.          Patient Goals and CMS Choice        Expected Discharge Plan and Services                                                Prior Living Arrangements/Services                       Activities of Daily Living Home Assistive Devices/Equipment: Bedside commode/3-in-1, CBG Meter, Walker (specify type) ADL Screening (condition at time of admission) Patient's cognitive ability adequate to safely complete daily activities?: No Is the patient deaf or have difficulty hearing?: No Does the patient have difficulty seeing, even when wearing glasses/contacts?: Yes Does the patient have difficulty concentrating, remembering, or making decisions?: No Patient able to express need for assistance with ADLs?: Yes Does the patient have difficulty dressing or bathing?: Yes Independently performs ADLs?: No Communication: Independent Grooming: Needs assistance Is this a change from baseline?: Pre-admission baseline Feeding: Independent Bathing: Needs assistance Is this a change from baseline?: Pre-admission baseline Toileting: Needs assistance Is this a change from baseline?: Pre-admission  baseline In/Out Bed: Dependent Is this a change from baseline?: Change from baseline, expected to last >3 days Walks in Home: Needs assistance Is this a change from baseline?: Change from baseline, expected to last >3 days Does the patient have difficulty walking or climbing stairs?: Yes Weakness of Legs: Left Weakness of Arms/Hands: None  Permission Sought/Granted                  Emotional Assessment              Admission diagnosis:  Complication of internal right knee prosthesis ([place]) [[id number], [place] Patient Active Problem List   Diagnosis Date Noted   Anemia 07/14/2021   Septic Arthritis, Infection of prosthetic right knee joint ([place]), MSSA infection 43/32/9518   Complication of internal right knee prosthesis ([place]) 07/10/2021   CAD (coronary artery disease) 06/08/2021   Carotid artery disease ([place]) 06/08/2021   Symptomatic bradycardia 06/08/2021   Refusal of blood transfusions as patient is Jehovah's Witness 06/08/2021   Paroxysmal atrial fibrillation ([place]) 06/08/2021   CKD (chronic kidney disease), stage III ([place]) 06/08/2021   Polyneuropathy associated with underlying disease ([place]) 05/04/2020   Severe nonproliferative diabetic retinopathy of left eye, with macular edema, associated with type 2 diabetes mellitus ([place]) 11/05/2019   Severe nonproliferative diabetic retinopathy of right eye, with macular edema, associated with type 2 diabetes mellitus ([place]) 11/05/2019  Retinal hemorrhage of right eye 11/05/2019   Retinal hemorrhage of left eye 11/05/2019   Diabetes mellitus ([place]) 05/14/2019   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, with long-term current use of insulin ([place]) 05/14/2019   Type 2 diabetes mellitus with diabetic polyneuropathy, with long-term current use of insulin ([place]) 05/14/2019   History of gout 05/20/2018   Primary localized osteoarthritis of right knee 05/13/2018   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 [place] [place] [address] [place] [address] Phone: [phone] Fax: [phone]     Social Determinants of Health (SDOH) Interventions    Readmission Risk Interventions No flowsheet data found.

## 2021-07-14 NOTE — Assessment & Plan Note (Addendum)
Patient on carvedilol 12.5 mg p.o. twice daily and Eliquis at home.  Eliquis has been resumed at this time and dose of Coreg has been decreased.Danielle [name]

## 2021-07-14 NOTE — Progress Notes (Signed)
[place] Progress Note Patient Name: Danielle [name] DOB: [birth year] MRN: [id number]   Date of Service  07/14/2021  HPI/Events of Note  Hgb 4.1...no obvious bleeding per nurse.  Pt had surgery yesterday on right knee(S/p R TKA explant for prosthetic joint infection with placement of antibiotic articulating knee spacer  wound VAC is empty. Last hg at 9.2 on 15 th.   eICU Interventions  Repeat stat Hg/Hct.  Hemodynamically stable as per bed side RN.  Type and cross match sent already.   If low would call ortho and transfuse.      Intervention Category Intermediate Interventions: Other: (anemia)  Elmer Sow 07/14/2021, 6:07 AM),

## 2021-07-14 NOTE — Assessment & Plan Note (Addendum)
Refuses blood transfusion.

## 2021-07-14 NOTE — Progress Notes (Signed)
This chaplain introduced herself to the Pt. During the visit, the Pt. is focused on the pain in her right knee. The chaplain is appreciative of the RN-Mitchell method of listening and responding to the Pt. request for pain control. This chaplain will F/U at another time.  Chaplain Sallyanne Kuster [phone]

## 2021-07-14 NOTE — Assessment & Plan Note (Deleted)
Had a significant drop in hemoglobin from 9.2-4.1 on 2/16.  Was transferred back to the ICU. Since patient was lethargic and confused consent was obtained from daughter for blood transfusion.  She was transfused ?2 units of PRBC in the ICU.   Was 5.3 on 2/19.  Recheck today and noted to be 5.3 again.  We are avoiding inpatient blood draws.   Patient was given darbepoetin on 2/17. Its dosed every 2 weeks. She remains on iron supplements. Reason for anemia not entirely clear.  Thought to be due to blood loss from recent knee surgery. CT abdomen pelvis did not show any retroperitoneal hemorrhage.

## 2021-07-14 NOTE — Assessment & Plan Note (Addendum)
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  Patient underwent explant right total knee arthroplasty with implantation of articulating antibiotic spacer by Dr. Zachery Dakins on 07/12/2021.   Infectious disease was consulted.  Patient has been transitioned to cefazolin.  Has PICC line.  Last day of antibiotic will be 08/23/21.  OPAT orders placed by ID.  At this time plan is to discharge to skilled nursing facility for antibiotic.

## 2021-07-14 NOTE — Progress Notes (Signed)
NAME:  Danielle [name], MRN:  [id number], DOB:  02/07/1946, LOS: 4 ADMISSION DATE:  07/10/2021, CONSULTATION DATE:  07/12/21 REFERRING MD:  TRH, CHIEF COMPLAINT:  postop   History of Present Illness:  76 year old woman w/ hx of HFpEF, CAD w/ multiple prior PCIs, SSS s/p PPM, Afib on AC, OSA on CPAP, CKD< HTN, DM on insulin p/w recurrent R knee prosthetic infection requiring total explant today with antibiotic spacer placement.  EBL 1000cc.  Given 2.8 L crystalloid.  PCCM consulted for persistent hypotension postop.  Currently patient c/o knee pain, denies dizziness of SOB  Of not she is a practicing Jehovah's witness who is okay with albumin but not blood products.  Daughter is a Financial controller in 48m, present at bedside to corroborate hx. He is overridden her no blood or on 07/14/2021 she is receiving 2 units of packed cells for hemoglobin of 4.1  Pertinent  Medical History   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[place]) 02/05/2017   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 05/20/2018   Acute kidney failure ([place]) 05/15/2018   Arthritis    "all over" (05/22/2018)   Blood dyscrasia    per pt-has small blood cells-appears as if anemic   Breast cancer, left breast ([place]) 1985   CHF (congestive heart failure) (HCC)    Complication of anesthesia    difficult to awaken from per pt   Coronary artery disease    Dyspnea    Essential hypertension    Heart disease    Heart murmur    History of gout    Hypothyroidism    Obesity (BMI 30-39.9) 11/11/2017   OSA (obstructive sleep apnea) 02/18/2017    severe obstructive sleep apnea with an AHI of 75.6/h and mild central sleep apnea with a CAI of 7.7/h.  Oxygen saturations dropped as low as 82%.   He is on CPAP at 9 cm H2O.   OSA on CPAP    Personal history of chemotherapy    Personal history of radiation therapy    Presence of permanent cardiac pacemaker    Primary  localized osteoarthritis of right knee 05/13/2018   Refusal of blood transfusions as patient is Jehovah's Witness    Type II diabetes mellitus ([place])     Significant Hospital Events: Including procedures, antibiotic start and stop dates in addition to other pertinent events   2/13 admitted, cardiology consulted for pre-op risk assessment 2/15 TKA removal Transferred back to the intensive care with hemoglobin 4.1 status post surgery with 1000 cc blood loss  Interim History / Subjective:  Off pressors since ~0300 Patient reports she didn't sleep overnight.  Has refused lab draws except in her feet- awaiting am labs  Objective   Blood pressure (!) 95/54, pulse 85, temperature 99.9 F (37.7 C), temperature source Oral, resp. rate 20, height 5\' 4"  (1.626 m), weight 95.7 kg, SpO2 100 %.        Intake/Output Summary (Last 24 hours) at 07/14/2021 0853 Last data filed at 07/13/2021 1734 Gross per 24 hour  Intake 356.26 ml  Output 400 ml  Net -43.74 ml   Filed Weights   07/12/21 1501  Weight: 95.7 kg    Examination: General: Obese female who is lethargic HEENT: MM pink/moist no JVD is appreciated Neuro: Lethargic difficult to arouse CV: Sounds are regular PULM: Diminished in the bases  GI: soft, bsx4 active  GU: Amber urine Extremities: Right  knee immobilizer with drain with pump without drainage at this time Skin: no rashes or lesions   Resolved Hospital Problem list   N/a  Assessment & Plan:  Postoperative profound anemia and a Jehovah's Witness status post surgical intervention of the right knee with 1000 cc blood loss. Her daughters have over read in her no blood order she will receive 2 units of packed cells with a follow-up CBC for additional packed cells if needed. Consider Epogen  TKA infection recurrent s/p Explant right total knee arthroplasty with implantation of articulating antibiotic spacer 07/12/21 - Jehovah's witness with refusal of blood products except  albumin  P: She is now being transfused- off pressors, s/p albumin and feraheme overnight, remains hemodynamically stable Transfer back to intensive care unit Transfusion 2 units of packed cells.     Uncontrolled DMT2-  continue SSI and semglee  CBG (last 3)  Recent Labs    07/13/21 1659 07/13/21 2304 07/14/21 0747  GLUCAP 142* 128* 161*    CAD/ stents, HTN, systolic/ diastolic HF (EF 72-53%, G6YQ, mildly elevated PASP) - continue statin.  Aspirin PAF-no anticoagulation  CKD stage IIIa-  Lab Results  Component Value Date   CREATININE 1.59 (H) 07/14/2021   CREATININE 1.41 (H) 07/13/2021   CREATININE 1.36 (H) 07/12/2021   Monitor OSA on CPAP  Best Practice (right click and "Reselect all SmartList Selections" daily)   Diet/type: Regular consistency (see orders) DVT prophylaxis: ASA on holdnow, eliquis on hold GI prophylaxis: N/A Lines: N/A Foley:  N/A Code Status:  full code Last date of multidisciplinary goals of care discussion 2/15. Daughters updated at bedside. Code status discussed in setting anemia post knee surgery. Daughter Rajvi Armentor and other sisters have decided to override their mother and   Labs   CBC: Recent Labs  Lab 07/10/21 1404 07/12/21 0343 07/14/21 0407 07/14/21 0640  WBC 9.0 8.7 9.2  --   NEUTROABS  --  5.1 5.9  --   HGB 10.3* 9.2* 4.1* 4.1*  HCT 34.3* 30.1* 13.4* 13.1*  MCV 69.0* 67.9* 68.7*  --   PLT 219 230 176  --     Basic Metabolic Panel: Recent Labs  Lab 07/10/21 1404 07/12/21 0343 07/13/21 0834 07/14/21 0407  NA 136 137 137 137  K 3.7 3.9 4.8 4.3  CL 97* 99 101 100  CO2 29 28 26 28   GLUCOSE 191* 116* 176* 144*  BUN 21 19 25* 31*  CREATININE 1.22* 1.36* 1.41* 1.59*  CALCIUM 9.2 8.7* 7.9* 8.1*   GFR: Estimated Creatinine Clearance: 34.8 mL/min (A) (by C-G formula based on SCr of 1.59 mg/dL (H)). Recent Labs  Lab 07/10/21 1404 07/12/21 0343 07/14/21 0407  WBC 9.0 8.7 9.2    Liver Function Tests: Recent Labs   Lab 07/10/21 1404  AST 36  ALT 33  ALKPHOS 70  BILITOT 0.9  PROT 7.6  ALBUMIN 3.3*   No results for input(s): LIPASE, AMYLASE in the last 168 hours. No results for input(s): AMMONIA in the last 168 hours.  ABG No results found for: PHART, PCO2ART, PO2ART, HCO3, TCO2, ACIDBASEDEF, O2SAT   Coagulation Profile: No results for input(s): INR, PROTIME in the last 168 hours.  Cardiac Enzymes: No results for input(s): CKTOTAL, CKMB, CKMBINDEX, TROPONINI in the last 168 hours.  HbA1C: Hgb A1c MFr Bld  Date/Time Value Ref Range Status  07/10/2021 02:08 PM 8.2 (H) 4.8 - 5.6 % Final    Comment:    (NOTE) Pre diabetes:  5.7%-6.4%  Diabetes:              >6.4%  Glycemic control for   <7.0% adults with diabetes   04/13/2021 03:16 PM 7.5 (H) 4.8 - 5.6 % Final    Comment:    (NOTE) Pre diabetes:          5.7%-6.4%  Diabetes:              >6.4%  Glycemic control for   <7.0% adults with diabetes     CBG: Recent Labs  Lab 07/13/21 0636 07/13/21 1134 07/13/21 1659 07/13/21 2304 07/14/21 0747  GLUCAP 177* 159* 142* 128* 161*      Critical care time: 40 min     Steve Janet Decesare ACNP Acute Care Nurse Practitioner [place] Please consult Amion 07/14/2021, 8:53 AM

## 2021-07-14 NOTE — Progress Notes (Signed)
Danielle [name] for Infectious Disease  Date of Admission:  07/10/2021           Reason for visit: Follow up on PJI  Current antibiotics: Cefazolin 2/13--present  ASSESSMENT:    75 y.o. female admitted with:  Recurrent right knee PJI: Status post DAIR procedure in Nov 2022 with OR cultures at that time growing MSSA.  Received 6 weeks of cefazolin followed by suppressive Keflex.  Infection recurred in setting of suppression plus having ran out of her antibiotics for approximately 1 week prior to admission.  However, she was having worsening pain even on Keflex.  Status post explant and abx spacer 07/12/21 with orthopedics.  OR cultures are NGTD Shock:  Cannot rule out sepsis at this point given post-op fever, but would favor ABLA as cause of shock. Acute blood loss anemia: Receiving PRBC now. Acute on CKD 3: Creatinine clearance today is 34.8 and creatinine up to 1.6 in setting of hypotension and surgery. Atrial fibrillation: Patient is on Eliquis for this and rifampin was not used in setting of her prior PJI due to this drug interaction.  Eliquis currently on hold post-operatively.  RECOMMENDATIONS:    Continue cefazolin for now.  May consider broadening if continued hypotension after volume resuscitation or persistent fevers. Check CXR.  Seems potentially high risk for aspiration with decreased mental status.  Could switch to Unasyn if aspiration pneumonia noted, however, she is not currently hypoxic. Check blood cultures x 2. No concerns noted around PICC line site. Follow OR cultures. Wound care, glycemic control. Lab monitoring. Will follow.   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)   Type 2 diabetes mellitus with diabetic polyneuropathy, with long-term current use of insulin (HCC)   Septic arthritis (HCC)   Paroxysmal atrial  fibrillation (HCC)   CKD (chronic kidney disease), stage III (HCC)   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)   MSSA (methicillin susceptible Staphylococcus aureus) infection   CHF (congestive heart failure) (HCC)    MEDICATIONS:    Scheduled Meds:  aspirin EC  81 mg Oral Daily   atorvastatin  80 mg Oral QHS   Chlorhexidine Gluconate Cloth  6 each Topical Daily   empagliflozin  10 mg Oral Daily   gabapentin  300 mg Oral BID   insulin aspart  0-15 Units Subcutaneous TID WC   insulin aspart  0-5 Units Subcutaneous QHS   insulin glargine-yfgn  40 Units Subcutaneous QHS   levothyroxine  75 mcg Oral Q0600   senna  1 tablet Oral BID   sodium chloride flush  10-40 mL Intracatheter Q12H   Continuous Infusions:  sodium chloride 250 mL (07/13/21 0209)    ceFAZolin (ANCEF) IV 2 g (07/14/21 0532)   lactated ringers     lactated ringers 10 mL/hr at 07/12/21 1516   methocarbamol (ROBAXIN) IV Stopped (07/13/21 0648)   PRN Meds:.acetaminophen, bisacodyl, diphenhydrAMINE, HYDROmorphone (DILAUDID) injection, methocarbamol **OR** methocarbamol (ROBAXIN) IV, ondansetron **OR** ondansetron (ZOFRAN) IV, oxyCODONE, oxyCODONE, polyethylene glycol, sodium chloride flush, sodium phosphate  SUBJECTIVE:   24 hour events:  Getting a blood transfusion this AM after hemoglobin was down to 4.1 PICC line placed yesterday Creatinine 1.59 WBC 9.2 Febrile this morning Hypotensive Transferring to 70M  Patient seen this AM.  Daughter at bedside.  Getting blood and waking up more.  Picc site looks oday.  Knee is immobilized with Vac in place.  Review of Systems  Unable to perform ROS: Acuity of condition     OBJECTIVE:   Blood pressure (!) 94/33, pulse 91, temperature (!) 101.2 F (38.4 C), temperature source Axillary, resp. rate (!) 26, height 5\' 4"  (1.626 m), weight 95.7 kg, SpO2 100 %. Body mass index is 36.22 kg/m.  Physical Exam Constitutional:      General: She is not in acute  distress.    Appearance: She is ill-appearing.  HENT:     Head: Normocephalic and atraumatic.  Eyes:     Extraocular Movements: Extraocular movements intact.     Conjunctiva/sclera: Conjunctivae normal.  Cardiovascular:     Rate and Rhythm: Normal rate and regular rhythm.  Pulmonary:     Effort: Pulmonary effort is normal. No respiratory distress.     Breath sounds: Normal breath sounds.  Abdominal:     General: There is no distension.     Palpations: Abdomen is soft.     Tenderness: There is no abdominal tenderness.  Musculoskeletal:     Comments: Right knee immobilized and wound vac in place. RIght UE PICC in place.   Skin:    General: Skin is warm and dry.     Findings: No rash.  Neurological:     General: No focal deficit present.     Mental Status: She is alert and oriented to person, place, and time.  Psychiatric:     Comments: She is lethargic.      Lab Results: Lab Results  Component Value Date   WBC 9.2 07/14/2021   HGB 4.1 (LL) 07/14/2021   HCT 13.1 (L) 07/14/2021   MCV 68.7 (L) 07/14/2021   PLT 176 07/14/2021    Lab Results  Component Value Date   NA 137 07/14/2021   K 4.3 07/14/2021   CO2 28 07/14/2021   GLUCOSE 144 (H) 07/14/2021   BUN 31 (H) 07/14/2021   CREATININE 1.59 (H) 07/14/2021   CALCIUM 8.1 (L) 07/14/2021   GFRNONAA 34 (L) 07/14/2021   GFRAA 33 (L) 01/21/2020    Lab Results  Component Value Date   ALT 33 07/10/2021   AST 36 07/10/2021   ALKPHOS 70 07/10/2021   BILITOT 0.9 07/10/2021       Component Value Date/Time   CRP 28.2 (H) 07/10/2021 1404       Component Value Date/Time   ESRSEDRATE 64 (H) 07/10/2021 1404     I have reviewed the micro and lab results in Epic.  Imaging: DG Knee Right Port  Result Date: 07/12/2021 CLINICAL DATA:  Postoperative knee replacement. EXAM: PORTABLE RIGHT KNEE - 1-2 VIEW COMPARISON:  04/14/2021 FINDINGS: There is interval removal of the prior tibial plateau hardware and prior femoral condyle  hardware. There is new cement related to new stem tunnels within the distal femur and proximal tibia. No metallic tibial prosthesis material is currently visualized. There is scattered bone density with chronic well corticated edges overlying the peripheral medial and lateral compartments. Severe patellofemoral joint space narrowing. Likely wound VAC drain overlies the proximal anterior knee. Mild soft tissue swelling and subcutaneous air consistent with recent surgery. Dense vascular calcifications. IMPRESSION: Interval recent revision of right knee arthroplasty hardware. No evidence of hardware failure. Electronically Signed   By: Yvonne Kendall M.D.   On: 07/12/2021 22:25   [place] EKG SITE RITE  Result Date: 07/13/2021 If Site Rite image not attached, placement could not be confirmed due to current cardiac rhythm.  Imaging independently reviewed in Epic.    Raynelle Highland for Infectious Disease [facility] [phone] pager 07/14/2021, 10:07 AM

## 2021-07-14 NOTE — Progress Notes (Signed)
PT Cancellation Note  Patient Details Name: Danielle [name] MRN: [id number] DOB: Sep 11, 1945   Cancelled Treatment:    Reason Eval/Treat Not Completed: Medical issues which prohibited therapy.  Pt has begun transfusing and moved to ICU for reduction in alertness and vitals declining.  Follow along with her to see when she is medically more stable for PT visit to evaluate.     Ramond Dial 07/14/2021, 2:03 PM  Mee Hives, PT PhD Acute Rehab Dept. Number: [place] [facility] [place]

## 2021-07-14 NOTE — Progress Notes (Addendum)
Subjective: Patient transferred from the ICU to the floor yesterday evening. Patient cognitively waxes and wanes. She is refusing blood products at this time given that she is a Sales promotion account executive witness. Discussed currently hemoglobin is extremely low and will make it difficult for her to mobilize, and put significant strain on her cardiovascular system. At this time she has been hemodynamically stable. She denies distal n/t.  Objective:   VITALS:   Vitals:   07/13/21 2305 07/14/21 0614 07/14/21 0620 07/14/21 0621  BP: 105/73 (!) 83/54 (!) 84/70 (!) 95/54  Pulse: 82 88 85 85  Resp: 20 19 19 20   Temp: 99 F (37.2 C) 99.9 F (37.7 C)    TempSrc: Oral Oral    SpO2:  100% 100% 100%  Weight:      Height:        Neurovascular intact Sensation intact distally Dorsiflexion/Plantar flexion intact Incision: prevena holding suction, no output in cannister Compartment soft   Lab Results  Component Value Date   WBC 9.2 07/14/2021   HGB 4.1 (LL) 07/14/2021   HCT 13.1 (L) 07/14/2021   MCV 68.7 (L) 07/14/2021   PLT 176 07/14/2021   BMET    Component Value Date/Time   NA 137 07/14/2021 0407   K 4.3 07/14/2021 0407   CL 100 07/14/2021 0407   CO2 28 07/14/2021 0407   GLUCOSE 144 (H) 07/14/2021 0407   BUN 31 (H) 07/14/2021 0407   CREATININE 1.59 (H) 07/14/2021 0407   CALCIUM 8.1 (L) 07/14/2021 0407   GFRNONAA 34 (L) 07/14/2021 0407      Xray: Post op xrays show knee spacer components and cement dowels in good position without adverse features  Assessment/Plan: 2 Days Post-Op   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  Hemoglobin 4.1. Patient refusing blood products at this time as she is a Sales promotion account executive witness. Hemodynamically stable. Discussed with patient and her daughter without blood products recovery will be very challenging from this point. Patient and daughter express understanding.    Post op recs: WB: 50% PWB, knee immobilizer when walking.  Can be removed when in bed or working with physical therapy Dressing: Prevena to remain intact until POD7 Abx: continue ancef per infectious disease recommendations, follow-up Intra-Op cultures and final ID recs. Aspiration results from clinic aspiration on 07/10/21 growing MSSA. Imaging: PACU xrays DVT prophylaxis: Aspirin 81mg  starting POD1. Will resume eliquis when appropriate, but given low hemoglobin at this time will continue to hold. Follow up:1 week after surgery for a wound check with Dr. Zachery Dakins at [facility].  Address: [address] [place], [place], [place] [address]  Office Phone: [phone]   Charlies Constable, MD Orthopaedic Surgery    Jaylean [place] 07/14/2021, 7:22 AM   Charlies Constable, MD  Contact information:   [phone] 7am-5pm epic message Dr. Zachery Dakins, or call  office for patient follow up: [phone] After hours and holidays please check [email or web address] for group call information for [facility]

## 2021-07-14 NOTE — Assessment & Plan Note (Addendum)
On Coreg and torsemide.  Dose of Coreg has been decreased at this time.

## 2021-07-14 NOTE — Progress Notes (Signed)
Date and time results received: 07/14/21 0500 (use smartphrase ".now" to insert current time)  Test: hgb Critical Value: 4.1  Name of Provider Notified:Mohan Kinila MD  Orders Received? Or Actions Taken?: Re-ordered pt h&h, type and screen ordered. Continue to monitor pt till the new results.

## 2021-07-14 NOTE — Care Management Important Message (Signed)
Important Message  Patient Details  Name: Danielle [name] MRN: [id number] Date of Birth: [birth year]   Medicare Important Message Given:  Yes     Hannah Beat 07/14/2021, 12:14 PM

## 2021-07-14 NOTE — Assessment & Plan Note (Addendum)
History of CAD post stents. 2D echocardiogram with LV ejection fraction of 45 to 50% from echocardiogram 2021.  Patient is on torsemide 40 mg p.o. daily, spironolactone 12.5 mg p.o. daily, losartan 25 mg p.o. daily, carvedilol 12.5 mg p.o. twice daily at home.  Continue Coreg and torsemide, on discharge.  Could resume losartan and spironolactone as outpatient if blood pressure is stable including renal function as outpatient..  Currently blood pressure marginally low.

## 2021-07-14 NOTE — Assessment & Plan Note (Addendum)
HbA1c 8.2.  Continue insulin regimen Jardiance on discharge.

## 2021-07-14 NOTE — Progress Notes (Signed)
°   07/14/21 0942  Assess: MEWS Score  Temp (!) 102 F (38.9 C)  BP (!) 70/53  Pulse Rate 90  ECG Heart Rate 91  Resp (!) 22  Level of Consciousness Responds to Voice  SpO2 99 %  O2 Device Nasal Cannula  O2 Flow Rate (L/min) 2 L/min  Assess: MEWS Score  MEWS Temp 2  MEWS Systolic 2  MEWS Pulse 0  MEWS RR 1  MEWS LOC 1  MEWS Score 6  MEWS Score Color Red  Assess: if the MEWS score is Yellow or Red  Were vital signs taken at a resting state? Yes  Focused Assessment No change from prior assessment  Early Detection of Sepsis Score *See Row Information* Medium  MEWS guidelines implemented *See Row Information* Yes  Treat  MEWS Interventions Escalated (See documentation below)  Pain Scale PAINAD  Pain Type Surgical pain  Pain Location Knee  Pain Orientation Right  Breathing 0  Negative Vocalization 1  Facial Expression 0  Body Language 1  Consolability 1  PAINAD Score 3  Take Vital Signs  Increase Vital Sign Frequency  Red: Q 1hr X 4 then Q 4hr X 4, if remains red, continue Q 4hrs (Pt getting blood)  Escalate  MEWS: Escalate Red: discuss with charge nurse/RN and provider, consider discussing with RRT  Notify: Charge Nurse/RN  Name of Charge Nurse/RN Notified Amrah,RN  Date Charge Nurse/RN Notified 07/14/21  Time Charge Nurse/RN Notified 0930  Notify: Provider  Provider Name/Title Asa Saunas, NP  Date Provider Notified 07/14/21  Time Provider Notified 0930  Notification Type Face-to-face  Provider response At bedside  Document  Patient Outcome Transferred/level of care increased  Progress note created (see row info) Yes   Patient H&H 4.1 this AM. Drsg c/d/I. No signs of bleeding. Daughter at bedside. Patient very lethargic, will answer questions but will not answer orientation questions. Minor, NP at bedside for transfer to ICU. SBP 90's-100's. On 2L oxygen. Patients daughter signed consent for patient to get blood 1st unit transfusing, patient transferred to 58M  before 2nd unit could be given.

## 2021-07-14 NOTE — Hospital Course (Addendum)
76 year old female with past medical history of bilateral knee replacement, recent recurrent right knee septic arthritis, essential hypertension, coronary artery disease status post stent placement, combined chronic systolic and diastolic heart failure, CKD stage IIIa, history of left breast cancer, obstructive sleep apnea on CPAP, morbid obesity, gout, Jehovah's Witness presented to the hospital for failed antibiotic treatment of right knee recurrent septic arthritis.  Of note patient had initially undergone  right TKA with Dr. Mardelle Matte December 2019 with development of acute pain/swelling of the right knee in November 2022 in which she was admitted to the hospital and diagnosed with a staph infection in her prosthetic knee.  She underwent irrigation debridement with liner exchange on April 14, 2021 and postoperatively treated with IV antibiotics with cefazolin with transition to Keflex thereafter.  On this presentation, patient was initially admitted to the intensive care unit.  Patient was subsequently transferred to hospital service on 07/14/2021 but was noted to have acute drop in hemoglobin and was transferred back to the ICU.  Patient however received blood products in the ICU for 1 time.  Patient was then stabilized and was transferred back to medical service.

## 2021-07-15 ENCOUNTER — Inpatient Hospital Stay (HOSPITAL_COMMUNITY): Payer: Medicare HMO

## 2021-07-15 DIAGNOSIS — T8453XA Infection and inflammatory reaction due to internal right knee prosthesis, initial encounter: Secondary | ICD-10-CM | POA: Diagnosis not present

## 2021-07-15 DIAGNOSIS — G9341 Metabolic encephalopathy: Secondary | ICD-10-CM

## 2021-07-15 DIAGNOSIS — D62 Acute posthemorrhagic anemia: Secondary | ICD-10-CM | POA: Diagnosis not present

## 2021-07-15 LAB — CBC WITH DIFFERENTIAL/PLATELET
Abs Immature Granulocytes: 0.17 10*3/uL — ABNORMAL HIGH (ref 0.00–0.07)
Basophils Absolute: 0 10*3/uL (ref 0.0–0.1)
Basophils Relative: 0 %
Eosinophils Absolute: 0.1 10*3/uL (ref 0.0–0.5)
Eosinophils Relative: 1 %
HCT: 15.4 % — ABNORMAL LOW (ref 36.0–46.0)
Hemoglobin: 5 g/dL — CL (ref 12.0–15.0)
Immature Granulocytes: 2 %
Lymphocytes Relative: 21 %
Lymphs Abs: 2 10*3/uL (ref 0.7–4.0)
MCH: 23.7 pg — ABNORMAL LOW (ref 26.0–34.0)
MCHC: 32.5 g/dL (ref 30.0–36.0)
MCV: 73 fL — ABNORMAL LOW (ref 80.0–100.0)
Monocytes Absolute: 1 10*3/uL (ref 0.1–1.0)
Monocytes Relative: 11 %
Neutro Abs: 6.3 10*3/uL (ref 1.7–7.7)
Neutrophils Relative %: 65 %
Platelets: 206 10*3/uL (ref 150–400)
RBC: 2.11 MIL/uL — ABNORMAL LOW (ref 3.87–5.11)
RDW: 18.8 % — ABNORMAL HIGH (ref 11.5–15.5)
WBC: 9.5 10*3/uL (ref 4.0–10.5)
nRBC: 2.2 % — ABNORMAL HIGH (ref 0.0–0.2)

## 2021-07-15 LAB — GLUCOSE, CAPILLARY
Glucose-Capillary: 136 mg/dL — ABNORMAL HIGH (ref 70–99)
Glucose-Capillary: 122 mg/dL — ABNORMAL HIGH (ref 70–99)
Glucose-Capillary: 133 mg/dL — ABNORMAL HIGH (ref 70–99)
Glucose-Capillary: 148 mg/dL — ABNORMAL HIGH (ref 70–99)

## 2021-07-15 LAB — BASIC METABOLIC PANEL
Anion gap: 11 (ref 5–15)
BUN: 37 mg/dL — ABNORMAL HIGH (ref 8–23)
CO2: 27 mmol/L (ref 22–32)
Calcium: 7.8 mg/dL — ABNORMAL LOW (ref 8.9–10.3)
Chloride: 97 mmol/L — ABNORMAL LOW (ref 98–111)
Creatinine, Ser: 1.56 mg/dL — ABNORMAL HIGH (ref 0.44–1.00)
GFR, Estimated: 35 mL/min — ABNORMAL LOW (ref >60)
Glucose, Bld: 152 mg/dL — ABNORMAL HIGH (ref 70–99)
Potassium: 3.6 mmol/L (ref 3.5–5.1)
Sodium: 135 mmol/L (ref 135–145)

## 2021-07-15 LAB — MAGNESIUM: Magnesium: 2.3 mg/dL (ref 1.7–2.4)

## 2021-07-15 LAB — PROCALCITONIN: Procalcitonin: 0.52 ng/mL

## 2021-07-15 LAB — PHOSPHORUS: Phosphorus: 2.1 mg/dL — ABNORMAL LOW (ref 2.5–4.6)

## 2021-07-15 MED ORDER — THIAMINE HCL 100 MG PO TABS
100.0000 mg | ORAL_TABLET | Freq: Every day | ORAL | Status: DC
  Administered 2021-07-15 – 2021-08-02 (×19): 100 mg via ORAL
  Filled 2021-07-15 (×19): qty 1

## 2021-07-15 MED ORDER — FERROUS SULFATE 325 (65 FE) MG PO TABS
325.0000 mg | ORAL_TABLET | Freq: Two times a day (BID) | ORAL | Status: DC
Start: 2021-07-15 — End: 2021-08-02
  Administered 2021-07-15 – 2021-08-02 (×24): 325 mg via ORAL
  Filled 2021-07-15 (×31): qty 1

## 2021-07-15 MED ORDER — LIP MEDEX EX OINT
TOPICAL_OINTMENT | CUTANEOUS | Status: DC | PRN
  Filled 2021-07-15: qty 7

## 2021-07-15 MED ORDER — FOLIC ACID 1 MG PO TABS
1.0000 mg | ORAL_TABLET | Freq: Every day | ORAL | Status: DC
  Administered 2021-07-15 – 2021-08-02 (×19): 1 mg via ORAL
  Filled 2021-07-15 (×19): qty 1

## 2021-07-15 MED ORDER — OXYCODONE HCL 5 MG PO TABS
5.0000 mg | ORAL_TABLET | ORAL | Status: DC | PRN
  Administered 2021-07-15 – 2021-08-01 (×41): 5 mg via ORAL
  Filled 2021-07-15 (×45): qty 1

## 2021-07-15 MED ORDER — ADULT MULTIVITAMIN W/MINERALS CH
1.0000 | ORAL_TABLET | Freq: Every day | ORAL | Status: DC
  Administered 2021-07-15 – 2021-08-02 (×19): 1 via ORAL
  Filled 2021-07-15 (×19): qty 1

## 2021-07-15 MED ORDER — POTASSIUM PHOSPHATES 15 MMOLE/5ML IV SOLN
30.0000 mmol | Freq: Once | INTRAVENOUS | Status: AC
  Administered 2021-07-15: 30 mmol via INTRAVENOUS
  Filled 2021-07-15: qty 10

## 2021-07-15 NOTE — Progress Notes (Signed)
Subjective: 3 Days Post-Op s/p Procedure(s): EXCISIONAL TOTAL KNEE ARTHROPLASTY WITH ANTIBIOTIC SPACERS   Patient is drowsy, moaning, asks not to be touched. When asked if she is in pain she shakes head no.    Objective:  PE: VITALS:   Vitals:   07/15/21 0715 07/15/21 0730 07/15/21 0800 07/15/21 0815  BP: (!) 111/56 (!) 105/59 (!) 80/62 101/86  Pulse: 76 76 82 80  Resp: 14 14 19 15   Temp:   98.4 F (36.9 C)   TempSrc:   Axillary   SpO2: 98% 98% 99% 99%  Weight:      Height:       Neurovascular intact Sensation intact distally Dorsiflexion/Plantar flexion intact Incision: prevena holding suction, no output in cannister Compartment soft  LABS  Results for orders placed or performed during the hospital encounter of 07/10/21 (from the past 24 hour(s))  Glucose, capillary     Status: Abnormal   Collection Time: 07/14/21 11:40 AM  Result Value Ref Range   Glucose-Capillary 190 (H) 70 - 99 mg/dL  Vitamin B12     Status: None   Collection Time: 07/14/21  2:39 PM  Result Value Ref Range   Vitamin B-12 591 180 - 914 pg/mL  Folate     Status: None   Collection Time: 07/14/21  2:39 PM  Result Value Ref Range   Folate 33.9 >5.9 ng/mL  Iron and TIBC     Status: Abnormal   Collection Time: 07/14/21  2:39 PM  Result Value Ref Range   Iron 107 28 - 170 ug/dL   TIBC 127 (L) 250 - 450 ug/dL   Saturation Ratios 84 (H) 10.4 - 31.8 %   UIBC 20 ug/dL  Ferritin     Status: Abnormal   Collection Time: 07/14/21  2:39 PM  Result Value Ref Range   Ferritin 3,925 (H) 11 - 307 ng/mL  Reticulocytes     Status: Abnormal   Collection Time: 07/14/21  2:39 PM  Result Value Ref Range   Retic Ct Pct 1.2 0.4 - 3.1 %   RBC. 2.16 (L) 3.87 - 5.11 MIL/uL   Retic Count, Absolute 26.1 19.0 - 186.0 K/uL   Immature Retic Fract 31.0 (H) 2.3 - 15.9 %  Procalcitonin - Baseline     Status: None   Collection Time: 07/14/21  2:39 PM  Result Value Ref Range   Procalcitonin 0.74 ng/mL  CBC      Status: Abnormal   Collection Time: 07/14/21  2:39 PM  Result Value Ref Range   WBC 9.4 4.0 - 10.5 K/uL   RBC 2.13 (L) 3.87 - 5.11 MIL/uL   Hemoglobin 4.9 (LL) 12.0 - 15.0 g/dL   HCT 15.4 (L) 36.0 - 46.0 %   MCV 72.3 (L) 80.0 - 100.0 fL   MCH 23.0 (L) 26.0 - 34.0 pg   MCHC 31.8 30.0 - 36.0 g/dL   RDW 18.6 (H) 11.5 - 15.5 %   Platelets 193 150 - 400 K/uL   nRBC 1.5 (H) 0.0 - 0.2 %  Culture, blood (Routine X 2) w Reflex to ID Panel     Status: None (Preliminary result)   Collection Time: 07/14/21  2:50 PM   Specimen: BLOOD RIGHT HAND  Result Value Ref Range   Specimen Description BLOOD RIGHT HAND    Special Requests      BOTTLES DRAWN AEROBIC AND ANAEROBIC Blood Culture adequate volume   Culture      NO GROWTH < 24 HOURS  Performed at [facility], [place] [address] [place], [place] [address]    Report Status PENDING   Culture, blood (Routine X 2) w Reflex to ID Panel     Status: None (Preliminary result)   Collection Time: 07/14/21  3:05 PM   Specimen: BLOOD LEFT HAND  Result Value Ref Range   Specimen Description BLOOD LEFT HAND    Special Requests      BOTTLES DRAWN AEROBIC ONLY Blood Culture results may not be optimal due to an inadequate volume of blood received in culture bottles   Culture      NO GROWTH < 24 HOURS Performed at [facility] [address], [place], [place] [address]    Report Status PENDING   Glucose, capillary     Status: Abnormal   Collection Time: 07/14/21  4:17 PM  Result Value Ref Range   Glucose-Capillary 160 (H) 70 - 99 mg/dL  Glucose, capillary     Status: Abnormal   Collection Time: 07/14/21  9:11 PM  Result Value Ref Range   Glucose-Capillary 159 (H) 70 - 99 mg/dL  Basic metabolic panel     Status: Abnormal   Collection Time: 07/15/21  6:21 AM  Result Value Ref Range   Sodium 135 135 - 145 mmol/L   Potassium 3.6 3.5 - 5.1 mmol/L   Chloride 97 (L) 98 - 111 mmol/L   CO2 27 22 - 32 mmol/L   Glucose, Bld 152 (H) 70 - 99 mg/dL    BUN 37 (H) 8 - 23 mg/dL   Creatinine, Ser 1.56 (H) 0.44 - 1.00 mg/dL   Calcium 7.8 (L) 8.9 - 10.3 mg/dL   GFR, Estimated 35 (L) >60 mL/min   Anion gap 11 5 - 15  Magnesium     Status: None   Collection Time: 07/15/21  6:21 AM  Result Value Ref Range   Magnesium 2.3 1.7 - 2.4 mg/dL  Phosphorus     Status: Abnormal   Collection Time: 07/15/21  6:21 AM  Result Value Ref Range   Phosphorus 2.1 (L) 2.5 - 4.6 mg/dL  CBC with Differential/Platelet     Status: Abnormal   Collection Time: 07/15/21  6:21 AM  Result Value Ref Range   WBC 9.5 4.0 - 10.5 K/uL   RBC 2.11 (L) 3.87 - 5.11 MIL/uL   Hemoglobin 5.0 (LL) 12.0 - 15.0 g/dL   HCT 15.4 (L) 36.0 - 46.0 %   MCV 73.0 (L) 80.0 - 100.0 fL   MCH 23.7 (L) 26.0 - 34.0 pg   MCHC 32.5 30.0 - 36.0 g/dL   RDW 18.8 (H) 11.5 - 15.5 %   Platelets 206 150 - 400 K/uL   nRBC 2.2 (H) 0.0 - 0.2 %   Neutrophils Relative % 65 %   Neutro Abs 6.3 1.7 - 7.7 K/uL   Lymphocytes Relative 21 %   Lymphs Abs 2.0 0.7 - 4.0 K/uL   Monocytes Relative 11 %   Monocytes Absolute 1.0 0.1 - 1.0 K/uL   Eosinophils Relative 1 %   Eosinophils Absolute 0.1 0.0 - 0.5 K/uL   Basophils Relative 0 %   Basophils Absolute 0.0 0.0 - 0.1 K/uL   Immature Granulocytes 2 %   Abs Immature Granulocytes 0.17 (H) 0.00 - 0.07 K/uL  Procalcitonin     Status: None   Collection Time: 07/15/21  6:21 AM  Result Value Ref Range   Procalcitonin 0.52 ng/mL  Glucose, capillary     Status: Abnormal  Collection Time: 07/15/21  7:51 AM  Result Value Ref Range   Glucose-Capillary 136 (H) 70 - 99 mg/dL    CT ABDOMEN PELVIS WO CONTRAST  Result Date: 07/14/2021 CLINICAL DATA:  Retroperitoneal bleed suspected acute blood loss anemia, rule out retroperitoneal bleed EXAM: CT ABDOMEN AND PELVIS WITHOUT CONTRAST TECHNIQUE: Multidetector CT imaging of the abdomen and pelvis was performed following the standard protocol without IV contrast. RADIATION DOSE REDUCTION: This exam was performed according to  the departmental dose-optimization program which includes automated exposure control, adjustment of the mA and/or kV according to patient size and/or use of iterative reconstruction technique. COMPARISON:  CT abdomen pelvis 12/04/2019. FINDINGS: Lower chest: No acute abnormality. Bibasilar dependent mild atelectasis. Mitral annular calcifications. Partially visualized dual chamber pacemaker leads. Hepatobiliary: No focal liver abnormality is seen. Prior cholecystectomy. Pancreas: Unremarkable. No pancreatic ductal dilatation or surrounding inflammatory changes. Spleen: Normal in size without focal abnormality. Adrenals/Urinary Tract: Adrenal glands are unremarkable. No hydronephrosis or nephrolithiasis. The bladder is decompressed with Foley catheter in place. Stomach/Bowel: There is a gastric diverticulum posteriorly. There is no evidence of bowel obstruction.The appendix is normal. No acute inflammatory process involving the bowel. Vascular/Lymphatic: Aortoiliac atherosclerotic calcifications. No AAA. No lymphadenopathy. Reproductive: Unchanged left adnexal calcification. Other: Fat containing umbilical hernia. Mild diastasis recti. There is no evidence of retroperitoneal hematoma. No abdominopelvic ascites. No free air. Musculoskeletal: No acute osseous abnormality. No suspicious lytic or blastic lesions. Bilateral hip osteoarthritis. Moderate severe changes of the pubic symphysis. Multilevel degenerative changes of the spine, worst at L4-L5 with grade 1 anterolisthesis. No suspicious lytic or blastic lesions. IMPRESSION: No acute abdominopelvic abnormality. No evidence of retroperitoneal or intraperitoneal hematoma. Bibasilar atelectasis, corresponding to opacities seen on chest radiograph acquired earlier today. Electronically Signed   By: Maurine Simmering M.D.   On: 07/14/2021 17:36   DG Chest Port 1 View  Result Date: 07/15/2021 CLINICAL DATA:  Respiration abnormal EXAM: PORTABLE CHEST 1 VIEW COMPARISON:   Radiograph 07/14/2021 FINDINGS: Unchanged cardiomediastinal silhouette with dual chamber pacemaker leads. Mildly increased interstitial opacities. No large pleural effusion. No visible pneumothorax. Bibasilar atelectasis. No acute osseous abnormality. IMPRESSION: Mildly increased diffuse interstitial opacities, possibly developing interstitial edema. Electronically Signed   By: Maurine Simmering M.D.   On: 07/15/2021 09:51   DG CHEST PORT 1 VIEW  Result Date: 07/14/2021 CLINICAL DATA:  Pneumonia EXAM: PORTABLE CHEST 1 VIEW COMPARISON:  Radiograph 07/10/2020 FINDINGS: Unchanged dual chamber pacemaker leads. Unchanged cardiomediastinal silhouette. Bibasilar opacities favored to be atelectasis. No new airspace disease. No large pleural effusion. No visible pneumothorax. No acute osseous abnormality. IMPRESSION: Bibasilar opacities favored to be atelectasis, developing infection possible. Recommend radiographic follow-up. Electronically Signed   By: Maurine Simmering M.D.   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  Hemoglobin trending up 4.1 > 5.0. Patient refusing blood products at this time as she is a Sales promotion account executive witness.  Daughter overridden no blood order on 2/17 and she received 2 units PRBC for Hbg of 4.1.  Post op recs: WB: 50% PWB, knee immobilizer when walking.  Can be removed when in bed or working with physical therapy Dressing: Prevena to remain intact until POD7 Abx: continue ancef per infectious disease recommendations, follow-up Intra-Op cultures and final ID recs. Aspiration results from clinic aspiration on 07/10/21 growing MSSA. Imaging: PACU xrays DVT prophylaxis: Aspirin 81mg  starting POD1. Will resume eliquis when appropriate, but given low hemoglobin at this time will continue to hold. Follow up:1 week after surgery for a wound check with Dr. Zachery Dakins at [facility].  Address: [address] [place], [place], [place] [address]  Office Phone: [phone]   Contact information:   Weekdays 8-5 [address]lene [address]ing, [place] [phone] A fter hours and holidays please check [email or web address] for group call information for [facility]  Ventura Bruns 07/15/2021, 11:07 AM

## 2021-07-15 NOTE — Progress Notes (Addendum)
NAME:  Danielle [name], MRN:  [id number], DOB:  [birth year], LOS: 5 ADMISSION DATE:  07/10/2021, CONSULTATION DATE:  07/12/21 REFERRING MD:  TRH, CHIEF COMPLAINT:  postop   History of Present Illness:  76 year old woman w/ hx of HFpEF, CAD w/ multiple prior PCIs, SSS s/p PPM, Afib on AC, OSA on CPAP, CKD, HTN, DM on insulin p/w recurrent R knee prosthetic infection requiring total explant 2/15 with antibiotic spacer placement.  EBL 1000cc.  Given 2.8 L crystalloid.  PCCM consulted for persistent hypotension postop.  Patient c/o knee pain post op. Of note, she is a practicing Jehovah's witness who is okay with albumin but not blood products.  Daughter is a Financial controller in 23m, present at bedside to corroborate hx. She has overridden her no blood or on 07/14/2021 she is receiving 2 units of packed cells for hemoglobin of 4.1  Pertinent  Medical History   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[place]) 02/05/2017   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 05/20/2018   Acute kidney failure ([place]) 05/15/2018   Arthritis    "all over" (05/22/2018)   Blood dyscrasia    per pt-has small blood cells-appears as if anemic   Breast cancer, left breast ([place]) 1985   CHF (congestive heart failure) (HCC)    Complication of anesthesia    difficult to awaken from per pt   Coronary artery disease    Dyspnea    Essential hypertension    Heart disease    Heart murmur    History of gout    Hypothyroidism    Obesity (BMI 30-39.9) 11/11/2017   OSA (obstructive sleep apnea) 02/18/2017    severe obstructive sleep apnea with an AHI of 75.6/h and mild central sleep apnea with a CAI of 7.7/h.  Oxygen saturations dropped as low as 82%.   He is on CPAP at 9 cm H2O.   OSA on CPAP    Personal history of chemotherapy    Personal history of radiation therapy    Presence of permanent cardiac pacemaker    Primary localized osteoarthritis of  right knee 05/13/2018   Refusal of blood transfusions as patient is Jehovah's Witness    Type II diabetes mellitus ([place])     Significant Hospital Events: Including procedures, antibiotic start and stop dates in addition to other pertinent events   2/13 admitted, cardiology consulted for pre-op risk assessment 2/15 TKA removal 2/17 Transferred back to the intensive care with hemoglobin 4.1 status post surgery with 1000 cc blood loss.  Weaned off pressors, pt refused lab draws  Interim History / Subjective:  Afebrile  Drowsy this am > appropriate but slow to respond, RN reports PRN diaudid given + oxy I/O 1L UOP, even balance in last 24 hours  Glucose range 136-152   Objective   Blood pressure 101/86, pulse 80, temperature 98.4 F (36.9 C), temperature source Axillary, resp. rate 15, height 5\' 4"  (1.626 m), weight 98.3 kg, SpO2 99 %.        Intake/Output Summary (Last 24 hours) at 07/15/2021 1002 Last data filed at 07/15/2021 0800 Gross per 24 hour  Intake 1068.44 ml  Output 1075 ml  Net -6.56 ml   Filed Weights   07/12/21 1501 07/15/21 0500  Weight: 95.7 kg 98.3 kg    Examination: General: ill appearing adult female lying in bed in NAD HEENT: MM pink/moist, anicteric, [place] O2 2L  Neuro: sedate,  awakens to voice, answers appropriately but slow to respond CV: s1s2 RRR, SR 80's on monitor, no m/r/g PULM: non-labored at rest, lungs bilaterally with exp wheezing GI: soft, bsx4 active  Extremities: warm/dry, RLE edema / in full length immobilizer brace, VAC in place Skin: no rashes or lesions  Resolved Hospital Problem list   Shock   Assessment & Plan:   TKA with MSSA Infection MSSA from joint aspiration in office 2/13.  S/p explant 2/15 of right total knee arthroplasty with implantation of articulating antibiotic spacer.  OR cultures also growing staph. Situation complicated by patients refusal of blood products.  Will accept albumin, family temporarily granted administration  of 2 units packed cells 2/17.  Now off all pressors.  -monitor in ICU -continue cefazolin -follow clinical exam of knee / monitor for erythema, evidence of infection  -trend WBC, fever curve  -follow repeat knee, blood cultures > rare staph noted on prelim knee culture  -appreciate Ortho  -follow PCT   Post-Operative Pain  -reduce PO regimen given sedation am 2/18 on exam & hx of OSA  -PT efforts as able   Post-Operative Anemia  Pt Jehovah's Witness s/p surgical intervention of the right knee with 1000 cc blood loss. Her daughters authorized PRBC 2 units on 2/17  -s/p darbepoetin 2/17, Q2 weekly dosing  -add ferrous sulfate 325mg  BID  -add thiamine, folate, MVI -trend CBC judiciously   DM II Poorly controlled, Hgb A1c 8.2  -glucose goal 140-180  -continue glargine 40 units QHS -SSI, moderate scale + HS coverage   Hx HTN, Systolic /Diastolic CHF (EF 67-54%, G9EE, mildly elevated PASP) -continue ASA, statin   PAF -no anticoagulation due to profound anemia  -hold home eliquis   CKD Stage IIIa Hypophosphatemia  -Trend BMP / urinary output -Replace electrolytes as indicated, Kphos 30 mmol 2/18 -Avoid nephrotoxic agents, ensure adequate renal perfusion  OSA  -CPAP QHS if patient will wear   Best Practice (right click and "Reselect all SmartList Selections" daily)  Diet/type: Regular consistency (see orders) DVT prophylaxis: ASA on holdnow, eliquis on hold GI prophylaxis: N/A Lines: N/A Foley:  N/A Code Status:  full code Last date of multidisciplinary goals of care discussion 2/15 > Daughters updated at bedside. Code status discussed in setting anemia post knee surgery. Daughter Danielle [name] and other sisters have decided to override their mother and give blood products during episode of shock.    Daughter, Danielle [name], updated via phone 2/18 on plan of care.  Critical Care Time: 36 minutes   Danielle Gens, MSN, APRN, NP-C, AGACNP-BC [facility] 07/15/2021, 10:02 AM   Please see [email or web address] for pager details.   From 7A-7P if no response, please call [phone] After hours, please call ELink [phone]

## 2021-07-15 NOTE — Consult Note (Signed)
[facility] Nurse Consult Note: Reason for Consult: Consulted regarding Incisional Wound VAC placed in OR on 2/15. Wound type:Surgical/Infectious Pressure Injury POA: N/A  Nursing is provided with orders regarding Prevena Plus incisional wound VAC. Instructions provided for Bedside RN.  [facility] nursing team will not follow, but will remain available to this patient, the nursing and medical teams.  Please re-consult if needed. Thanks, Maudie Flakes, MSN, RN, [place], Arther Abbott  Pager# [phone]

## 2021-07-15 NOTE — Evaluation (Signed)
Physical Therapy Evaluation Patient Details Name: Danielle [name] MRN: [id number] DOB: [birth year] Today's Date: 07/15/2021  History of Present Illness  Patient is a 76 y/o female admitted due to R TKA infection.  Initially had TKA 04/2018 then had pain/swelling 03/2021 with I&D and liner exchange and IV antibotics.  Admitted with worse pain and difficulty ambulating with fevers.  S/p R explant TKA w/implantation of articulating antibiotic spacer on 07/12/2021.  Post op had severe anemia with transfer to ICU due to [facility] witness and refusing blood products.  Did finally have transfusion 2/17 with hemoglobin at 5.0 after transfusion.  PMH positive for HFpEF, CAD w/ multiple prior PCIs, SSS s/p PPM, Afib on AC, OSA on CPAP, CKD, HTN, DM.  Clinical Impression  Patient was not able to transition to EOB or OOB today due to pain, lethargy, anxiety.  She was positioned in bed for improved upright as leaning to L initially.  RN reports sleeping much of her shift after medicated for pain.  Patient repeatedly declined mobility to EOB or OOB despite encouragement and multiple attempts. Did reposition and move bed into chair position.  Patient will continue to benefit from skilled PT in the acute setting.  Previously was home independent with walker for about 4 weeks after rehab stay from last surgery.  She will likely need STSNF again prior to d/c home.        Recommendations for follow up therapy are one component of a multi-disciplinary discharge planning process, led by the attending physician.  Recommendations may be updated based on patient status, additional functional criteria and insurance authorization.  Follow Up Recommendations Skilled nursing-short term rehab (<3 hours/day)    Assistance Recommended at Discharge Frequent or constant Supervision/Assistance  Patient can return home with the following  Two people to help with walking and/or transfers;Two people to help with  bathing/dressing/bathroom;Assistance with feeding    Equipment Recommendations None recommended by PT  Recommendations for Other Services       Functional Status Assessment Patient has had a recent decline in their functional status and demonstrates the ability to make significant improvements in function in a reasonable and predictable amount of time.     Precautions / Restrictions Precautions Precautions: Fall;Knee Required Braces or Orthoses: Knee Immobilizer - Right Knee Immobilizer - Right: On when out of bed or walking Restrictions Weight Bearing Restrictions: Yes RLE Weight Bearing: Partial weight bearing RLE Partial Weight Bearing Percentage or Pounds: 50%      Mobility  Bed Mobility Overal bed mobility: Needs Assistance             General bed mobility comments: scooting up in bed with RN assist +2 and repositioning in semi-chair position bringing shoulders to R due to patient leaning L    Transfers                        Ambulation/Gait                  Stairs            Wheelchair Mobility    Modified Rankin (Stroke Patients Only)       Balance Overall balance assessment: Needs assistance   Sitting balance-Leahy Scale: Zero Sitting balance - Comments: shoulders falling to L while sitting supported in bed  Pertinent Vitals/Pain Pain Assessment Pain Assessment: Faces Faces Pain Scale: Hurts whole lot Pain Location: R LE and R shoulder with movement Pain Descriptors / Indicators: Grimacing, Guarding Pain Intervention(s): Limited activity within patient's tolerance, Repositioned, RN gave pain meds during session    [place] expects to be discharged to:: Skilled nursing facility Living Arrangements: Alone Available Help at Discharge: Available PRN/intermittently             Home Equipment: BSC/3in1;Rollator (4 wheels);Cane - single point       Prior Function Prior Level of Function : Independent/Modified Independent             Mobility Comments: per daughter home from rehab stay after admission in [facility] about 4 weeks.       Hand Dominance        Extremity/Trunk Assessment   Upper Extremity Assessment Upper Extremity Assessment: Generalized weakness;Difficult to assess due to impaired cognition    Lower Extremity Assessment Lower Extremity Assessment: RLE deficits/detail;LLE deficits/detail RLE Deficits / Details: knee immobilizer on and pt refusing to assist to move it at all LLE Deficits / Details: AROM WFL, strength grossly 3+/5       Communication   Communication: No difficulties  Cognition Arousal/Alertness: Lethargic Behavior During Therapy: Anxious Overall Cognitive Status: Impaired/Different from baseline Area of Impairment: Attention, Following commands, Safety/judgement                   Current Attention Level: Focused   Following Commands: Follows one step commands with increased time, Follows one step commands inconsistently Safety/Judgement: Decreased awareness of safety     General Comments: irritated at times by efforts to have her move and refusing to move despite encouragement and multiple trials        General Comments General comments (skin integrity, edema, etc.): spoke by phone with daughter, Danielle [name], (C[place]on on 18M) about plans for follow up and reports she went to SNF after surgery in Nov and would still need the same after this stay.    Exercises     Assessment/Plan    PT Assessment Patient needs continued PT services  PT Problem List         PT Treatment Interventions DME instruction;Therapeutic activities;Cognitive remediation;Patient/family education;Therapeutic exercise;Gait training;Balance training;Functional mobility training    PT Goals (Current goals can be found in the Care Plan section)  Acute Rehab PT Goals Patient Stated Goal: to go to rehab PT Goal  Formulation: With patient/family Time For Goal Achievement: 07/29/21 Potential to Achieve Goals: Fair    Frequency Min 3X/week     Co-evaluation               AM-PAC PT "6 Clicks" Mobility  Outcome Measure Help needed turning from your back to your side while in a flat bed without using bedrails?: Total Help needed moving from lying on your back to sitting on the side of a flat bed without using bedrails?: Total Help needed moving to and from a bed to a chair (including a wheelchair)?: Total Help needed standing up from a chair using your arms (e.g., wheelchair or bedside chair)?: Total Help needed to walk in hospital room?: Total Help needed climbing 3-5 steps with a railing? : Total 6 Click Score: 6    End of Session   Activity Tolerance: Patient limited by pain Patient left: in bed;with call bell/phone within reach   PT Visit Diagnosis: Difficulty in walking, not elsewhere classified (R26.2);Pain;Muscle weakness (generalized) (M62.81) Pain - Right/Left: Right Pain -  part of body: Knee    Time: 1420-1444 PT Time Calculation (min) (ACUTE ONLY): 24 min   Charges:   PT Evaluation $PT Eval Moderate Complexity: 1 Mod          Cyndi Aldyn Toon, PT Acute Rehabilitation Services YVOPF:[phone] Office:[phone] 07/15/2021   Reginia Naas 07/15/2021, 4:49 PM

## 2021-07-16 DIAGNOSIS — K5903 Drug induced constipation: Secondary | ICD-10-CM | POA: Diagnosis not present

## 2021-07-16 DIAGNOSIS — N179 Acute kidney failure, unspecified: Secondary | ICD-10-CM

## 2021-07-16 DIAGNOSIS — T8453XA Infection and inflammatory reaction due to internal right knee prosthesis, initial encounter: Secondary | ICD-10-CM | POA: Diagnosis not present

## 2021-07-16 DIAGNOSIS — D62 Acute posthemorrhagic anemia: Secondary | ICD-10-CM | POA: Diagnosis not present

## 2021-07-16 DIAGNOSIS — Z531 Procedure and treatment not carried out because of patient's decision for reasons of belief and group pressure: Secondary | ICD-10-CM

## 2021-07-16 LAB — GLUCOSE, CAPILLARY
Glucose-Capillary: 123 mg/dL — ABNORMAL HIGH (ref 70–99)
Glucose-Capillary: 155 mg/dL — ABNORMAL HIGH (ref 70–99)
Glucose-Capillary: 111 mg/dL — ABNORMAL HIGH (ref 70–99)
Glucose-Capillary: 137 mg/dL — ABNORMAL HIGH (ref 70–99)
Glucose-Capillary: 126 mg/dL — ABNORMAL HIGH (ref 70–99)

## 2021-07-16 LAB — CBC
HCT: 16.6 % — ABNORMAL LOW (ref 36.0–46.0)
Hemoglobin: 5.3 g/dL — CL (ref 12.0–15.0)
MCH: 22.8 pg — ABNORMAL LOW (ref 26.0–34.0)
MCHC: 31.9 g/dL (ref 30.0–36.0)
MCV: 71.6 fL — ABNORMAL LOW (ref 80.0–100.0)
Platelets: 301 10*3/uL (ref 150–400)
RBC: 2.32 MIL/uL — ABNORMAL LOW (ref 3.87–5.11)
RDW: 20.3 % — ABNORMAL HIGH (ref 11.5–15.5)
WBC: 11.4 10*3/uL — ABNORMAL HIGH (ref 4.0–10.5)
nRBC: 2.1 % — ABNORMAL HIGH (ref 0.0–0.2)

## 2021-07-16 LAB — BASIC METABOLIC PANEL
Anion gap: 13 (ref 5–15)
BUN: 32 mg/dL — ABNORMAL HIGH (ref 8–23)
CO2: 24 mmol/L (ref 22–32)
Calcium: 8.2 mg/dL — ABNORMAL LOW (ref 8.9–10.3)
Chloride: 98 mmol/L (ref 98–111)
Creatinine, Ser: 1.23 mg/dL — ABNORMAL HIGH (ref 0.44–1.00)
GFR, Estimated: 46 mL/min — ABNORMAL LOW (ref >60)
Glucose, Bld: 124 mg/dL — ABNORMAL HIGH (ref 70–99)
Potassium: 3.8 mmol/L (ref 3.5–5.1)
Sodium: 135 mmol/L (ref 135–145)

## 2021-07-16 LAB — PROCALCITONIN: Procalcitonin: 0.32 ng/mL

## 2021-07-16 MED ORDER — POLYETHYLENE GLYCOL 3350 17 G PO PACK
17.0000 g | PACK | Freq: Every day | ORAL | Status: DC
  Administered 2021-07-18 – 2021-08-02 (×13): 17 g via ORAL
  Filled 2021-07-16 (×17): qty 1

## 2021-07-16 MED ORDER — FUROSEMIDE 10 MG/ML IJ SOLN
40.0000 mg | Freq: Once | INTRAMUSCULAR | Status: AC
  Administered 2021-07-16: 40 mg via INTRAVENOUS
  Filled 2021-07-16: qty 4

## 2021-07-16 MED ORDER — HEPARIN SODIUM (PORCINE) 5000 UNIT/ML IJ SOLN
5000.0000 [IU] | Freq: Two times a day (BID) | INTRAMUSCULAR | Status: DC
  Administered 2021-07-16 – 2021-07-24 (×17): 5000 [IU] via SUBCUTANEOUS
  Filled 2021-07-16 (×17): qty 1

## 2021-07-16 MED ORDER — POTASSIUM CHLORIDE CRYS ER 20 MEQ PO TBCR
40.0000 meq | EXTENDED_RELEASE_TABLET | Freq: Once | ORAL | Status: AC
  Administered 2021-07-16: 40 meq via ORAL
  Filled 2021-07-16: qty 2

## 2021-07-16 NOTE — Progress Notes (Signed)
Subjective: 4 Days Post-Op s/p Procedure(s): EXCISIONAL TOTAL KNEE ARTHROPLASTY WITH ANTIBIOTIC SPACERS  Having pain in her knee today. States she has been moved around a lot today which caused pain.    Objective:  PE: VITALS:   Vitals:   07/16/21 0754 07/16/21 0800 07/16/21 0900 07/16/21 1000  BP:  (!) 116/33 (!) 111/58 (!) 105/42  Pulse:  90 86 90  Resp:  (!) 23 18 (!) 26  Temp: 98.3 F (36.8 C)     TempSrc: Oral     SpO2:  100% 99% 97%  Weight:      Height:       Neurovascular intact Sensation intact distally Dorsiflexion/Plantar flexion intact Incision: prevena holding suction, no output in cannister. Knee immobilizer in place Compartment soft  LABS  Results for orders placed or performed during the hospital encounter of 07/10/21 (from the past 24 hour(s))  Glucose, capillary     Status: Abnormal   Collection Time: 07/15/21 11:42 AM  Result Value Ref Range   Glucose-Capillary 122 (H) 70 - 99 mg/dL  Glucose, capillary     Status: Abnormal   Collection Time: 07/15/21  3:20 PM  Result Value Ref Range   Glucose-Capillary 133 (H) 70 - 99 mg/dL  Glucose, capillary     Status: Abnormal   Collection Time: 07/15/21  7:31 PM  Result Value Ref Range   Glucose-Capillary 148 (H) 70 - 99 mg/dL  Procalcitonin     Status: None   Collection Time: 07/16/21  4:34 AM  Result Value Ref Range   Procalcitonin 0.32 ng/mL  Basic metabolic panel     Status: Abnormal   Collection Time: 07/16/21  4:34 AM  Result Value Ref Range   Sodium 135 135 - 145 mmol/L   Potassium 3.8 3.5 - 5.1 mmol/L   Chloride 98 98 - 111 mmol/L   CO2 24 22 - 32 mmol/L   Glucose, Bld 124 (H) 70 - 99 mg/dL   BUN 32 (H) 8 - 23 mg/dL   Creatinine, Ser 1.23 (H) 0.44 - 1.00 mg/dL   Calcium 8.2 (L) 8.9 - 10.3 mg/dL   GFR, Estimated 46 (L) >60 mL/min   Anion gap 13 5 - 15  CBC     Status: Abnormal   Collection Time: 07/16/21  4:34 AM  Result Value Ref Range   WBC 11.4 (H) 4.0 - 10.5 K/uL   RBC 2.32 (L)  3.87 - 5.11 MIL/uL   Hemoglobin 5.3 (LL) 12.0 - 15.0 g/dL   HCT 16.6 (L) 36.0 - 46.0 %   MCV 71.6 (L) 80.0 - 100.0 fL   MCH 22.8 (L) 26.0 - 34.0 pg   MCHC 31.9 30.0 - 36.0 g/dL   RDW 20.3 (H) 11.5 - 15.5 %   Platelets 301 150 - 400 K/uL   nRBC 2.1 (H) 0.0 - 0.2 %  Glucose, capillary     Status: Abnormal   Collection Time: 07/16/21  7:35 AM  Result Value Ref Range   Glucose-Capillary 123 (H) 70 - 99 mg/dL    CT ABDOMEN PELVIS WO CONTRAST  Result Date: 07/14/2021 CLINICAL DATA:  Retroperitoneal bleed suspected acute blood loss anemia, rule out retroperitoneal bleed EXAM: CT ABDOMEN AND PELVIS WITHOUT CONTRAST TECHNIQUE: Multidetector CT imaging of the abdomen and pelvis was performed following the standard protocol without IV contrast. RADIATION DOSE REDUCTION: This exam was performed according to the departmental dose-optimization program which includes automated exposure control, adjustment of the mA and/or kV according to  patient size and/or use of iterative reconstruction technique. COMPARISON:  CT abdomen pelvis 12/04/2019. FINDINGS: Lower chest: No acute abnormality. Bibasilar dependent mild atelectasis. Mitral annular calcifications. Partially visualized dual chamber pacemaker leads. Hepatobiliary: No focal liver abnormality is seen. Prior cholecystectomy. Pancreas: Unremarkable. No pancreatic ductal dilatation or surrounding inflammatory changes. Spleen: Normal in size without focal abnormality. Adrenals/Urinary Tract: Adrenal glands are unremarkable. No hydronephrosis or nephrolithiasis. The bladder is decompressed with Foley catheter in place. Stomach/Bowel: There is a gastric diverticulum posteriorly. There is no evidence of bowel obstruction.The appendix is normal. No acute inflammatory process involving the bowel. Vascular/Lymphatic: Aortoiliac atherosclerotic calcifications. No AAA. No lymphadenopathy. Reproductive: Unchanged left adnexal calcification. Other: Fat containing umbilical  hernia. Mild diastasis recti. There is no evidence of retroperitoneal hematoma. No abdominopelvic ascites. No free air. Musculoskeletal: No acute osseous abnormality. No suspicious lytic or blastic lesions. Bilateral hip osteoarthritis. Moderate severe changes of the pubic symphysis. Multilevel degenerative changes of the spine, worst at L4-L5 with grade 1 anterolisthesis. No suspicious lytic or blastic lesions. IMPRESSION: No acute abdominopelvic abnormality. No evidence of retroperitoneal or intraperitoneal hematoma. Bibasilar atelectasis, corresponding to opacities seen on chest radiograph acquired earlier today. Electronically Signed   By: Maurine Simmering M.D.   On: 07/14/2021 17:36   DG Chest Port 1 View  Result Date: 07/15/2021 CLINICAL DATA:  Respiration abnormal EXAM: PORTABLE CHEST 1 VIEW COMPARISON:  Radiograph 07/14/2021 FINDINGS: Unchanged cardiomediastinal silhouette with dual chamber pacemaker leads. Mildly increased interstitial opacities. No large pleural effusion. No visible pneumothorax. Bibasilar atelectasis. No acute osseous abnormality. IMPRESSION: Mildly increased diffuse interstitial opacities, possibly developing interstitial edema. Electronically Signed   By: Maurine Simmering M.D.   On: 07/15/2021 09:51   DG CHEST PORT 1 VIEW  Result Date: 07/14/2021 CLINICAL DATA:  Pneumonia EXAM: PORTABLE CHEST 1 VIEW COMPARISON:  Radiograph 07/10/2020 FINDINGS: Unchanged dual chamber pacemaker leads. Unchanged cardiomediastinal silhouette. Bibasilar opacities favored to be atelectasis. No new airspace disease. No large pleural effusion. No visible pneumothorax. No acute osseous abnormality. IMPRESSION: Bibasilar opacities favored to be atelectasis, developing infection possible. Recommend radiographic follow-up. Electronically Signed   By: Maurine Simmering M.D.   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  Hemoglobin trending up 5.3. Patient refusing blood products at this time as she is a Sales promotion account executive witness. Daughter overridden no blood order on 2/17 and she received 2 units PRBC for Hbg of 4.1.  Post op recs: WB: 50% PWB, knee immobilizer when walking.  Can be removed when in bed or working with physical therapy Dressing: Prevena to remain intact until POD7 Abx: continue ancef per infectious disease recommendations, follow-up Intra-Op cultures and final ID recs. Aspiration results from clinic aspiration on 07/10/21 growing MSSA. Imaging: PACU xrays DVT  prophylaxis: Aspirin 81mg  starting POD1. Will resume eliquis when appropriate, but given low hemoglobin at this time will continue to hold. Follow up:1 week after surgery for a wound check with Dr. Zachery Dakins at [facility].  Address: [address] [place], [place], [place] [address]  Office Phone: [phone]   Contact information:   After hours and holidays please check [email or web address] for group call information for [facility]  Ethelda Chick 07/16/2021, 10:24 AM

## 2021-07-16 NOTE — Progress Notes (Signed)
NAME:  Danielle [name], MRN:  [id number], DOB:  10/07/1945, LOS: 6 ADMISSION DATE:  07/10/2021, CONSULTATION DATE:  07/12/21 REFERRING MD:  TRH, CHIEF COMPLAINT:  postop   History of Present Illness:  76 year old woman w/ hx of HFpEF, CAD w/ multiple prior PCIs, SSS s/p PPM, Afib on AC, OSA on CPAP, CKD, HTN, DM on insulin p/w recurrent R knee prosthetic infection requiring total explant 2/15 with antibiotic spacer placement.  EBL 1000cc.  Given 2.8 L crystalloid.  PCCM consulted for persistent hypotension postop.  Patient c/o knee pain post op. Of note, she is a practicing Jehovah's witness who is okay with albumin but not blood products.  Daughter is a Financial controller in 66m, present at bedside to corroborate hx. She has overridden her no blood or on 07/14/2021 she is receiving 2 units of packed cells for hemoglobin of 4.1  Pertinent  Medical History   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[place]) 02/05/2017   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 05/20/2018   Acute kidney failure ([place]) 05/15/2018   Arthritis    "all over" (05/22/2018)   Blood dyscrasia    per pt-has small blood cells-appears as if anemic   Breast cancer, left breast ([place]) 1985   CHF (congestive heart failure) (HCC)    Complication of anesthesia    difficult to awaken from per pt   Coronary artery disease    Dyspnea    Essential hypertension    Heart disease    Heart murmur    History of gout    Hypothyroidism    Obesity (BMI 30-39.9) 11/11/2017   OSA (obstructive sleep apnea) 02/18/2017    severe obstructive sleep apnea with an AHI of 75.6/h and mild central sleep apnea with a CAI of 7.7/h.  Oxygen saturations dropped as low as 82%.   He is on CPAP at 9 cm H2O.   OSA on CPAP    Personal history of chemotherapy    Personal history of radiation therapy    Presence of permanent cardiac pacemaker    Primary localized osteoarthritis of  right knee 05/13/2018   Refusal of blood transfusions as patient is Jehovah's Witness    Type II diabetes mellitus ([place])     Significant Hospital Events: Including procedures, antibiotic start and stop dates in addition to other pertinent events   2/13 admitted, cardiology consulted for pre-op risk assessment 2/15 TKA removal 2/17 Transferred back to the intensive care with hemoglobin 4.1 status post surgery with 1000 cc blood loss.  Weaned off pressors, pt refused lab draws 2/19 HDS. Hgb up to 5.3   Interim History / Subjective:   Hgb up to 5.3  Upset with questions about how she is feeling, pain, sleep, etc   Objective   Blood pressure (!) 116/33, pulse 90, temperature 98.3 F (36.8 C), temperature source Oral, resp. rate (!) 23, height 5\' 4"  (1.626 m), weight 98 kg, SpO2 100 %.        Intake/Output Summary (Last 24 hours) at 07/16/2021 0933 Last data filed at 07/16/2021 0700 Gross per 24 hour  Intake 1368.66 ml  Output 390 ml  Net 978.66 ml   Filed Weights   07/12/21 1501 07/15/21 0500 07/16/21 0500  Weight: 95.7 kg 98.3 kg 98 kg    Examination: General: WDWN older adult F reclined in bed NAD  HEENT: MM pink/moist, anicteric, [place] O2 2L  Neuro: Awake alert.  Oriented x 2 Following commands  CV: rrr s1s2 cap refill < 3 sec  PULM: even, unlabored. Clear  GI: soft ndnt  Extremities: RLE immobilizer, wound vac.  Skin: c/d/w.  Psych: irritated mood. Intermittently non-cooperative   Resolved Hospital Problem list   Shock   Assessment & Plan:   Acute pain, post-operative  At risk for delirium  -multimodal analgesia  -PT efforts as able  -Delirium precautions   R TKR  complicated by MSSA joint space infection, s/p TKA explant of prosthetic joint  MSSA from joint aspiration in office 2/13.  S/p explant 2/15 of right total knee arthroplasty with implantation of articulating antibiotic spacer.  OR cultures also growing staph. Situation complicated by patients refusal of  blood products.  Will accept albumin, family temporarily granted administration of 2 units packed cells 2/17.  P -Cont ancef  -trend WBC, fever curve  -follow repeat cx data   ABLA related to TKA  Pt Jehovah's Witness s/p surgical intervention of the right knee with 1000 cc blood loss. Pt will accept albumin.  Her daughters did temporarily authorize PRBC  on 2/17. No further PRBC  -hgb up to 5.3 2/19  P -s/p darbepoetin 2/17, Q2 weekly dosing  -add ferrous sulfate 325mg  BID  -add thiamine, folate, MVI -stop qD CBC   HTN Chronic systolic and diastolic HF  pAFib  (EF 16-10%, G2DD, mildly elevated PASP) P -ASA, statin  -monitor volume status  -hold home eliquis 2/2 significant anemia  -goal K >  4 Mag  >  2, supplement as indicated   CKD IIIa  -Trend renal indices, UOP  DM II Poorly controlled, Hgb A1c 8.2  P -glucose goal 140-180  -continue glargine 40 units QHS -SSI, moderate scale + HS coverage   Hypothyroidism P -synthroid   OSA -Encourage QHS CPAP  Best Practice (right click and "Reselect all SmartList Selections" daily)  Diet/type: Regular consistency (see orders) DVT prophylaxis: ASA on holdnow, eliquis on hold GI prophylaxis: N/A Lines: N/A Foley:  N/A Code Status:  full code Last date of multidisciplinary goals of care discussion 2/15 > Daughters updated at bedside. Code status discussed in setting anemia post knee surgery. Daughter Christiana Gurevich and other sisters have decided to override their mother and give blood products during episode of shock.   Daughter, Burna Mortimer, updated via phone 2/18 on plan of care. Pt updated 2/19   Dispo: Hopefully transfer out of ICU 2/19   CRITICAL CARE CCT: n/a  Eliseo Gum MSN, AGACNP-BC [place] for pager  07/16/2021, 9:33 AM

## 2021-07-16 NOTE — Progress Notes (Signed)
Pt refused CPAP at this time.CPAP set up at bedside

## 2021-07-17 ENCOUNTER — Other Ambulatory Visit (HOSPITAL_COMMUNITY): Payer: Medicare HMO

## 2021-07-17 DIAGNOSIS — I4891 Unspecified atrial fibrillation: Secondary | ICD-10-CM

## 2021-07-17 DIAGNOSIS — I10 Essential (primary) hypertension: Secondary | ICD-10-CM | POA: Diagnosis not present

## 2021-07-17 DIAGNOSIS — T8453XA Infection and inflammatory reaction due to internal right knee prosthesis, initial encounter: Secondary | ICD-10-CM | POA: Diagnosis not present

## 2021-07-17 DIAGNOSIS — I5032 Chronic diastolic (congestive) heart failure: Secondary | ICD-10-CM | POA: Diagnosis not present

## 2021-07-17 DIAGNOSIS — N1832 Chronic kidney disease, stage 3b: Secondary | ICD-10-CM | POA: Diagnosis not present

## 2021-07-17 DIAGNOSIS — D62 Acute posthemorrhagic anemia: Secondary | ICD-10-CM | POA: Diagnosis not present

## 2021-07-17 LAB — AEROBIC/ANAEROBIC CULTURE W GRAM STAIN (SURGICAL/DEEP WOUND)

## 2021-07-17 LAB — CBC
HCT: 16.8 % — ABNORMAL LOW (ref 36.0–46.0)
Hemoglobin: 5.3 g/dL — CL (ref 12.0–15.0)
MCH: 23.6 pg — ABNORMAL LOW (ref 26.0–34.0)
MCHC: 31.5 g/dL (ref 30.0–36.0)
MCV: 74.7 fL — ABNORMAL LOW (ref 80.0–100.0)
Platelets: 361 10*3/uL (ref 150–400)
RBC: 2.25 MIL/uL — ABNORMAL LOW (ref 3.87–5.11)
RDW: 21.1 % — ABNORMAL HIGH (ref 11.5–15.5)
WBC: 10.1 10*3/uL (ref 4.0–10.5)
nRBC: 2.7 % — ABNORMAL HIGH (ref 0.0–0.2)

## 2021-07-17 LAB — GLUCOSE, CAPILLARY
Glucose-Capillary: 98 mg/dL (ref 70–99)
Glucose-Capillary: 73 mg/dL (ref 70–99)
Glucose-Capillary: 128 mg/dL — ABNORMAL HIGH (ref 70–99)
Glucose-Capillary: 98 mg/dL (ref 70–99)

## 2021-07-17 NOTE — Assessment & Plan Note (Addendum)
Creatinine on 07/26/2021 was 1.1 and was stable.

## 2021-07-17 NOTE — Progress Notes (Signed)
[facility] PROGRESS NOTE   Danielle [name] BUL:[id number] DOB: 1945/11/07 DOA: 07/10/2021  7 DOS: the patient was seen and examined on 07/17/2021  PCP: Seward Carol, MD  Brief History and Hospital Course:  76 year old female with past medical history significant for bilateral knee replacements with recent recurrent right knee septic arthritis, essential hypertension, CAD s/p PCI/stent, combined chronic systolic/diastolic congestive heart failure, CKD stage IIIa, history of left breast cancer, OSA on CPAP, type 2 diabetes mellitus, morbid obesity, gout, Jehovah's Witness who refuses blood products who presented to [facility] ED on 2/13 for evaluation of failed antibiotic treatment of right knee recurrent septic arthritis.  Patient initially undergone right TKA with Dr. Mardelle Matte December 2019 with development of acute pain/swelling of the right knee in November 2022 in which she was admitted to the hospital and diagnosed with a staph infection in her prosthetic knee.  She underwent irrigation debridement with liner exchange on April 14, 2021 and postoperatively treated with IV antibiotics with cefazolin with transition to Keflex thereafter.    Patient was hospitalized.  She had explant right total knee arthroplasty with implantation of articulating antibiotic spacer on 2/15.  She was initially admitted to the intensive care unit.  Was transferred to hospitalist service on 2/17 but was noted to have a significant drop in hemoglobin from 9.2-4.1.  She was transferred back to ICU.  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  She was noted to be lethargic.  Discussed with her daughter who gave consent for blood products in the ICU and so she was transfused.  Subsequently patient woke up and then declined any further transfusions.  She was stabilized and then transferred back to our service.  Consultants:  Orthopedics Infectious disease PCCM   Procedures:  Explant right total  knee arthroplasty with antibiotic spacer, Dr. Zachery Dakins, 2/15    Subjective: Patient very communicative.  Does not appear to be in any distress.  Denies any complaints at this time.  Discussed with daughter.    Assessment/Plan:   * Septic Arthritis, Infection of prosthetic right knee joint ([place]), MSSA infection Patient presenting to ED following failed outpatient treatment with antibiotics for recurrent right TKA prosthetic infection with MSSA.  Patient underwent explant right total knee arthroplasty with implantation of articulating antibiotic spacer by Dr. Zachery Dakins on 07/12/2021.    Infectious diseases following.  Patient remains on cefazolin. PICC line has been placed.  Duration of antibiotics per infectious disease.  Anemia Had a significant drop in hemoglobin from 9.2-4.1 on 2/16.  Was transferred back to the ICU. Since patient was lethargic and confused consent was obtained from daughter for blood transfusion.  She was transfused ?2 units of PRBC in the ICU.  Hemoglobin up to 5.3 as of yesterday.  We will not do daily blood work to avoid unnecessary blood loss. We will recheck hemoglobin tomorrow.   Reason for anemia not entirely clear.  Thought to be due to blood loss from recent knee surgery. CT abdomen pelvis did not show any retroperitoneal hemorrhage.  Refusal of blood transfusions as patient is Jehovah's Witness Refuses blood transfusion.  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   She has been on heart failure medications at home including torsemide 40 mg p.o. daily, spironolactone 12.5 mg p.o. daily, losartan 25 mg p.o. daily, carvedilol 12.5 mg p.o. twice daily. Antihypertensives were placed on hold due to hypotension.  They remain  on hold.  Continue to monitor. Vania Rea is being continued.  Essential hypertension- (present on admission) Antihypertensives on hold due to  hypotension.  Continue to monitor for now.    Type 2 diabetes mellitus with diabetic polyneuropathy, with long-term current use of insulin (HCC) HbA1c 8.2.  Remains on SSI and glargine insulin.  Monitor CBGs.    Paroxysmal atrial fibrillation ([place])- (present on admission) Patient on carvedilol 12.5 mg p.o. twice daily and Eliquis at home.  Given her severe anemia, holding anticoagulation as well as antihypertensives due to hypotension.  CKD (chronic kidney disease), stage III ([place])- (present on admission) Renal function is stable.  Continue to monitor.  Obesity (BMI 30-39.9)- (present on admission) Estimated body mass index is 37.27 kg/m as calculated from the following:   Height as of this encounter: 5\' 4"  (1.626 m).   Weight as of this encounter: 98.5 kg.    DVT Prophylaxis: Subcutaneous heparin Code Status: Full code Family Communication: Discussed with her daughter Disposition Plan: SNF when medically stable  Status is: Inpatient Remains inpatient appropriate because: Severe anemia, knee infection    Medications: Scheduled:  atorvastatin  80 mg Oral QHS   Chlorhexidine Gluconate Cloth  6 each Topical Daily   empagliflozin  10 mg Oral Daily   ferrous sulfate  325 mg Oral BID WC   folic acid  1 mg Oral Daily   heparin injection (subcutaneous)  5,000 Units Subcutaneous Q12H   insulin aspart  0-15 Units Subcutaneous TID WC   insulin aspart  0-5 Units Subcutaneous QHS   insulin glargine-yfgn  40 Units Subcutaneous QHS   levothyroxine  75 mcg Oral Q0600   mouth rinse  15 mL Mouth Rinse BID   multivitamin with minerals  1 tablet Oral Daily   polyethylene glycol  17 g Oral Daily   senna  1 tablet Oral BID   sodium chloride flush  10-40 mL Intracatheter Q12H   thiamine  100 mg Oral Daily   Continuous:  sodium chloride 250 mL (07/13/21 0209)    ceFAZolin (ANCEF) IV 2 g (07/17/21 0526)   lactated ringers 10 mL/hr at 07/12/21 1516   JOI:NOMVEHMCNOBSJ, bisacodyl,  diphenhydrAMINE, HYDROmorphone (DILAUDID) injection, lip balm, ondansetron **OR** ondansetron (ZOFRAN) IV, oxyCODONE, sodium chloride flush  Antibiotics: Anti-infectives (From admission, onward)    Start     Dose/Rate Route Frequency Ordered Stop   07/12/21 1916  tobramycin (NEBCIN) powder  Status:  Discontinued          As needed 07/12/21 1917 07/12/21 2124   07/12/21 1915  vancomycin (VANCOCIN) powder  Status:  Discontinued          As needed 07/12/21 1915 07/12/21 2124   07/12/21 1745  vancomycin (VANCOCIN) IVPB 1000 mg/200 mL premix        1,000 mg 200 mL/hr over 60 Minutes Intravenous  Once 07/12/21 1741 07/12/21 1759   07/12/21 1710  vancomycin (VANCOCIN) 1-5 GM/200ML-% IVPB       Note to Pharmacy: Hoy Morn E: cabinet override      07/12/21 1710 07/12/21 1742   07/10/21 1400  ceFAZolin (ANCEF) IVPB 2g/100 mL premix        2 g 200 mL/hr over 30 Minutes Intravenous Every 8 hours 07/10/21 1258         Objective:  Vital Signs  Vitals:   07/17/21 0248 07/17/21 0318 07/17/21 0500 07/17/21 0717  BP: (!) 108/46 (!) 107/53  (!) 127/45  Pulse: 81   83  Resp: 20 (!) 21  19  Temp: 99.6 F (37.6 C)   98.9 F (37.2 C)  TempSrc: Axillary   Oral  SpO2: 100%   93%  Weight:   98.5 kg   Height:        Intake/Output Summary (Last 24 hours) at 07/17/2021 1052 Last data filed at 07/17/2021 0718 Gross per 24 hour  Intake 160 ml  Output 300 ml  Net -140 ml   Filed Weights   07/15/21 0500 07/16/21 0500 07/17/21 0500  Weight: 98.3 kg 98 kg 98.5 kg    General appearance: Awake alert.  In no distress Resp: Clear to auscultation bilaterally.  Normal effort Cardio: S1-S2 is normal regular.  No S3-S4.  No rubs murmurs or bruit GI: Abdomen is soft.  Nontender nondistended.  Bowel sounds are present normal.  No masses organomegaly Extremities: Right lower extremity in immobilizer Neurologic:  No focal neurological deficits.    Lab Results:  Data Reviewed: I have personally  reviewed labs and imaging study reports  CBC: Recent Labs  Lab 07/12/21 0343 07/14/21 0407 07/14/21 0640 07/14/21 1439 07/15/21 0621 07/16/21 0434  WBC 8.7 9.2  --  9.4 9.5 11.4*  NEUTROABS 5.1 5.9  --   --  6.3  --   HGB 9.2* 4.1* 4.1* 4.9* 5.0* 5.3*  HCT 30.1* 13.4* 13.1* 15.4* 15.4* 16.6*  MCV 67.9* 68.7*  --  72.3* 73.0* 71.6*  PLT 230 176  --  193 206 675    Basic Metabolic Panel: Recent Labs  Lab 07/12/21 0343 07/13/21 0834 07/14/21 0407 07/15/21 0621 07/16/21 0434  NA 137 137 137 135 135  K 3.9 4.8 4.3 3.6 3.8  CL 99 101 100 97* 98  CO2 28 26 28 27 24   GLUCOSE 116* 176* 144* 152* 124*  BUN 19 25* 31* 37* 32*  CREATININE 1.36* 1.41* 1.59* 1.56* 1.23*  CALCIUM 8.7* 7.9* 8.1* 7.8* 8.2*  MG  --   --   --  2.3  --   PHOS  --   --   --  2.1*  --     GFR: Estimated Creatinine Clearance: 45.7 mL/min (A) (by C-G formula based on SCr of 1.23 mg/dL (H)).  Liver Function Tests: Recent Labs  Lab 07/10/21 1404  AST 36  ALT 33  ALKPHOS 70  BILITOT 0.9  PROT 7.6  ALBUMIN 3.3*      CBG: Recent Labs  Lab 07/16/21 1135 07/16/21 1550 07/16/21 1717 07/16/21 1954 07/17/21 0716  GLUCAP 155* 111* 137* 126* 98     Anemia Panel: Recent Labs    07/14/21 1439  VITAMINB12 591  FOLATE 33.9  FERRITIN 3,925*  TIBC 127*  IRON 107  RETICCTPCT 1.2    Recent Results (from the past 240 hour(s))  Surgical pcr screen     Status: None   Collection Time: 07/11/21  3:28 PM   Specimen: Nasopharyngeal Swab; Nasal Swab  Result Value Ref Range Status   MRSA, PCR NEGATIVE NEGATIVE Final   Staphylococcus aureus NEGATIVE NEGATIVE Final    Comment: (NOTE) The Xpert SA Assay (FDA approved for NASAL specimens in patients 23 years of age and older), is one component of a comprehensive surveillance program. It is not intended to diagnose infection nor to guide or monitor treatment. Performed at [facility], [place] [address] [place], [place] [address]   SARS  CORONAVIRUS 2 (TAT 6-24 HRS) Nasopharyngeal Nasopharyngeal Swab     Status: None   Collection Time: 07/11/21  3:42 PM  Specimen: Nasopharyngeal Swab  Result Value Ref Range Status   SARS Coronavirus 2 NEGATIVE NEGATIVE Final    Comment: (NOTE) SARS-CoV-2 target nucleic acids are NOT DETECTED.  The SARS-CoV-2 RNA is generally detectable in upper and lower respiratory specimens during the acute phase of infection. Negative results do not preclude SARS-CoV-2 infection, do not rule out co-infections with other pathogens, and should not be used as the sole basis for treatment or other patient management decisions. Negative results must be combined with clinical observations, patient history, and epidemiological information. The expected result is Negative.  Fact Sheet for Patients: SugarRoll.be  Fact Sheet for Healthcare Providers: https://www.woods-mathews.com/  This test is not yet approved or cleared by the [place] FDA and  has been authorized for detection and/or diagnosis of SARS-CoV-2 by FDA under an Emergency Use Authorization (EUA). This EUA will remain  in effect (meaning this test can be used) for the duration of the COVID-19 declaration under Se ction 564(b)(1) of the Act, 21 U.S.C. section 360bbb-3(b)(1), unless the authorization is terminated or revoked sooner.  Performed at [facility], [place] [address] [place], [place] [address]   Aerobic/Anaerobic Culture w Gram Stain (surgical/deep wound)     Status: None (Preliminary result)   Collection Time: 07/12/21  5:39 PM   Specimen: Soft Tissue, Other  Result Value Ref Range Status   Specimen Description TISSUE  Final   Special Requests RIGHT KNEE  Final   Gram Stain   Final    ABUNDANT WBC PRESENT,BOTH PMN AND MONONUCLEAR NO ORGANISMS SEEN    Culture   Final    RARE STAPHYLOCOCCUS AUREUS NOTIFIED DR. Juleen China VIA EPIC CHAT OF CULTURE GROWTH, AT 1048 07/14/21 D.  VANHOOK NO ANAEROBES ISOLATED; CULTURE IN PROGRESS FOR 5 DAYS    Report Status PENDING  Incomplete   Organism ID, Bacteria STAPHYLOCOCCUS AUREUS  Final      Susceptibility   Staphylococcus aureus - MIC*    CIPROFLOXACIN <=0.5 SENSITIVE Sensitive     ERYTHROMYCIN <=0.25 SENSITIVE Sensitive     GENTAMICIN <=0.5 SENSITIVE Sensitive     OXACILLIN 0.5 SENSITIVE Sensitive     TETRACYCLINE <=1 SENSITIVE Sensitive     VANCOMYCIN 1 SENSITIVE Sensitive     TRIMETH/SULFA <=10 SENSITIVE Sensitive     CLINDAMYCIN <=0.25 SENSITIVE Sensitive     RIFAMPIN <=0.5 SENSITIVE Sensitive     Inducible Clindamycin Value in next row Sensitive      NEGATIVEPerformed at [facility] [address], [place], [place] [address]    * RARE STAPHYLOCOCCUS AUREUS  Aerobic/Anaerobic Culture w Gram Stain (surgical/deep wound)     Status: None (Preliminary result)   Collection Time: 07/12/21  5:40 PM   Specimen: Soft Tissue, Other  Result Value Ref Range Status   Specimen Description TISSUE  Final   Special Requests RIGHT KNEE  Final   Gram Stain   Final    ABUNDANT WBC PRESENT,BOTH PMN AND MONONUCLEAR NO ORGANISMS SEEN Performed at [facility], [address], [place], [place] [address]    Culture   Final    RARE STAPHYLOCOCCUS AUREUS NO ANAEROBES ISOLATED; CULTURE IN PROGRESS FOR 5 DAYS    Report Status PENDING  Incomplete   Organism ID, Bacteria STAPHYLOCOCCUS AUREUS  Final      Susceptibility   Staphylococcus aureus - MIC*    CIPROFLOXACIN <=0.5 SENSITIVE Sensitive     ERYTHROMYCIN <=0.25 SENSITIVE Sensitive     GENTAMICIN <=0.5 SENSITIVE Sensitive  OXACILLIN <=0.25 SENSITIVE Sensitive     TETRACYCLINE <=1 SENSITIVE Sensitive     VANCOMYCIN 1 SENSITIVE Sensitive     TRIMETH/SULFA <=10 SENSITIVE Sensitive     CLINDAMYCIN <=0.25 SENSITIVE Sensitive     RIFAMPIN <=0.5 SENSITIVE Sensitive     Inducible Clindamycin NEGATIVE Sensitive     * RARE STAPHYLOCOCCUS AUREUS  Aerobic/Anaerobic  Culture w Gram Stain (surgical/deep wound)     Status: None (Preliminary result)   Collection Time: 07/12/21  5:40 PM   Specimen: Soft Tissue, Other  Result Value Ref Range Status   Specimen Description TISSUE  Final   Special Requests RIGHT KNEE  Final   Gram Stain   Final    FEW WBC PRESENT,BOTH PMN AND MONONUCLEAR NO ORGANISMS SEEN Performed at [facility], [address] [place], [place] [address]    Culture   Final    RARE STAPHYLOCOCCUS AUREUS NO ANAEROBES ISOLATED; CULTURE IN PROGRESS FOR 5 DAYS    Report Status PENDING  Incomplete   Organism ID, Bacteria STAPHYLOCOCCUS AUREUS  Final      Susceptibility   Staphylococcus aureus - MIC*    CIPROFLOXACIN <=0.5 SENSITIVE Sensitive     ERYTHROMYCIN <=0.25 SENSITIVE Sensitive     GENTAMICIN <=0.5 SENSITIVE Sensitive     OXACILLIN 0.5 SENSITIVE Sensitive     TETRACYCLINE <=1 SENSITIVE Sensitive     VANCOMYCIN 1 SENSITIVE Sensitive     TRIMETH/SULFA <=10 SENSITIVE Sensitive     CLINDAMYCIN <=0.25 SENSITIVE Sensitive     RIFAMPIN <=0.5 SENSITIVE Sensitive     Inducible Clindamycin NEGATIVE Sensitive     * RARE STAPHYLOCOCCUS AUREUS  MRSA Next Gen by PCR, Nasal     Status: None   Collection Time: 07/13/21  4:16 AM   Specimen: Nasal Mucosa; Nasal Swab  Result Value Ref Range Status   MRSA by PCR Next Gen NOT DETECTED NOT DETECTED Final    Comment: (NOTE) The GeneXpert MRSA Assay (FDA approved for NASAL specimens only), is one component of a comprehensive MRSA colonization surveillance program. It is not intended to diagnose MRSA infection nor to guide or monitor treatment for MRSA infections. Test performance is not FDA approved in patients less than 78 years old. Performed at [facility], [place] [address] [place], [place] [address]   Culture, blood (Routine X 2) w Reflex to ID Panel     Status: None (Preliminary result)   Collection Time: 07/14/21  2:50 PM   Specimen: BLOOD RIGHT HAND  Result Value Ref Range  Status   Specimen Description BLOOD RIGHT HAND  Final   Special Requests   Final    BOTTLES DRAWN AEROBIC AND ANAEROBIC Blood Culture adequate volume   Culture   Final    NO GROWTH 3 DAYS Performed at [facility], [place] [address], [place], [place] [address]    Report Status PENDING  Incomplete  Culture, blood (Routine X 2) w Reflex to ID Panel     Status: None (Preliminary result)   Collection Time: 07/14/21  3:05 PM   Specimen: BLOOD LEFT HAND  Result Value Ref Range Status   Specimen Description BLOOD LEFT HAND  Final   Special Requests   Final    BOTTLES DRAWN AEROBIC ONLY Blood Culture results may not be optimal due to an inadequate volume of blood received in culture bottles   Culture   Final    NO GROWTH 3 DAYS Performed at [facility], [place]  [address], [place], [place] [address]    Report Status PENDING  Incomplete      Radiology Studies: No results found.     LOS: 7 days   Olufemi Mofield [employer] on [email or web address]  07/17/2021, 10:52 AM

## 2021-07-17 NOTE — Progress Notes (Signed)
Pharmacy Antibiotic Note  Danielle [name] is a 76 y.o. female admitted on 07/10/2021 with  prosthetic joint infection .  Pharmacy  Consulted on 07/10/21 for cefazolin dosing for right knee prosthetic joint infection. Started on Cefazolin 2 g IV q8h.  S/p explant and abx spacer 07/12/21 with orthopedics.   R knee wound cultures: MSSA infection. Blood cultures no growth to date.   CKD stage IIIa, SCr 1.23 on 07/16/21.  Plan: Continue Cefazolin 2 grams IV q8h Monitor clinical status, renal function and culture results daily.    Height: 5\' 4"  (162.6 cm) Weight: 98.5 kg (217 lb 2.5 oz) IBW/kg (Calculated) : 54.7  Temp (24hrs), Avg:98.7 F (37.1 C), Min:97.8 F (36.6 C), Max:99.7 F (37.6 C)  Recent Labs  Lab 07/12/21 0343 07/13/21 0834 07/14/21 0407 07/14/21 1439 07/15/21 0621 07/16/21 0434  WBC 8.7  --  9.2 9.4 9.5 11.4*  CREATININE 1.36* 1.41* 1.59*  --  1.56* 1.23*     Estimated Creatinine Clearance: 45.7 mL/min (A) (by C-G formula based on SCr of 1.23 mg/dL (H)).    Allergies  Allergen Reactions   Other Other (See Comments)    Blood Product Refusal (Jehovah's witness)     Tape Other (See Comments)    Leaves blisters and marks on the skin   Sulfa Antibiotics Rash   Uloric [Febuxostat] Rash    Antimicrobials this admission: 2/13 cefazolin >>   Dose adjustments this admission: No adjustments at this time  Microbiology results: 2/15 R knee cx x 3: MSSA  2/16 MRSA PCR: neg 2/17 BCx x2:  ngtd x 2 days   Thank you for allowing pharmacy to be a part of this patients care.  Nicole Cella, RPh Clinical Pharmacist [phone]  07/17/2021 9:03 AM  Please check AMION for all [place] phone numbers After 10:00 PM, call [place] [phone]

## 2021-07-17 NOTE — Plan of Care (Signed)
°  Problem: Clinical Measurements: Goal: Will remain free from infection Outcome: Progressing Goal: Respiratory complications will improve Outcome: Progressing   Problem: Coping: Goal: Level of anxiety will decrease Outcome: Progressing   Problem: Elimination: Goal: Will not experience complications related to urinary retention Outcome: Progressing   Problem: Pain Managment: Goal: General experience of comfort will improve Outcome: Progressing   Problem: Safety: Goal: Ability to remain free from injury will improve Outcome: Progressing   Problem: Skin Integrity: Goal: Risk for impaired skin integrity will decrease Outcome: Progressing   Problem: Education: Goal: Knowledge of General Education information will improve Description: Including pain rating scale, medication(s)/side effects and non-pharmacologic comfort measures Outcome: Not Progressing   Problem: Health Behavior/Discharge Planning: Goal: Ability to manage health-related needs will improve Outcome: Not Progressing   Problem: Clinical Measurements: Goal: Ability to maintain clinical measurements within normal limits will improve Outcome: Not Progressing Goal: Diagnostic test results will improve Outcome: Not Progressing Goal: Cardiovascular complication will be avoided Outcome: Not Progressing   Problem: Activity: Goal: Risk for activity intolerance will decrease Outcome: Not Progressing   Problem: Nutrition: Goal: Adequate nutrition will be maintained Outcome: Not Progressing   Problem: Elimination: Goal: Will not experience complications related to bowel motility Outcome: Not Progressing

## 2021-07-17 NOTE — Progress Notes (Signed)
Subjective: 5 Days Post-Op s/p Procedure(s): EXCISIONAL TOTAL KNEE ARTHROPLASTY WITH ANTIBIOTIC SPACERS  Has been moved to 5N. Continues to have pain in the knee today. Pain medicine is not helping very much. Wants to sleep this morning.    Objective:  PE: VITALS:   Vitals:   07/17/21 0248 07/17/21 0318 07/17/21 0500 07/17/21 0717  BP: (!) 108/46 (!) 107/53  (!) 127/45  Pulse: 81   83  Resp: 20 (!) 21  19  Temp: 99.6 F (37.6 C)   98.9 F (37.2 C)  TempSrc: Axillary   Oral  SpO2: 100%   93%  Weight:   98.5 kg   Height:       Neurovascular intact Sensation intact distally Dorsiflexion/Plantar flexion intact Incision: prevena holding suction, no output in cannister. Knee immobilizer in place Compartment soft Swelling of the left ankle Endorses distal sensation  LABS  Results for orders placed or performed during the hospital encounter of 07/10/21 (from the past 24 hour(s))  Glucose, capillary     Status: Abnormal   Collection Time: 07/16/21 11:35 AM  Result Value Ref Range   Glucose-Capillary 155 (H) 70 - 99 mg/dL  Glucose, capillary     Status: Abnormal   Collection Time: 07/16/21  3:50 PM  Result Value Ref Range   Glucose-Capillary 111 (H) 70 - 99 mg/dL  Glucose, capillary     Status: Abnormal   Collection Time: 07/16/21  5:17 PM  Result Value Ref Range   Glucose-Capillary 137 (H) 70 - 99 mg/dL  Glucose, capillary     Status: Abnormal   Collection Time: 07/16/21  7:54 PM  Result Value Ref Range   Glucose-Capillary 126 (H) 70 - 99 mg/dL  Glucose, capillary     Status: None   Collection Time: 07/17/21  7:16 AM  Result Value Ref Range   Glucose-Capillary 98 70 - 99 mg/dL    No results found.  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  Hemoglobin trending up 5.3 yesterday. Ordered CBC this AM.  Patient refusing blood products at this time as she is a Jehovah's witness. Daughter overridden no blood order on 2/17 and she received 2 units PRBC for Hbg of 4.1.  Post op recs: WB: 50% PWB, knee immobilizer when walking.  Can be removed when in bed or working with physical therapy Dressing: Prevena to remain intact until POD7 Abx: continue ancef per infectious disease recommendations, follow-up Intra-Op cultures and final ID recs. Aspiration results from clinic aspiration on 07/10/21 growing MSSA. Imaging: PACU xrays DVT prophylaxis: Aspirin 81mg  starting POD1. Will resume eliquis when appropriate, but given low hemoglobin at this time will continue to hold. Follow up:1 week after surgery for a wound check with Dr. Zachery Dakins at [facility]  [employer].  Address: [address], [place], [place] [address]  Office Phone: [phone]   Contact information:   After hours and holidays please check [email or web address] for group call information for [facility]  Ethelda Chick 07/17/2021, 8:13 AM

## 2021-07-17 NOTE — Progress Notes (Signed)
PHARMACY CONSULT NOTE FOR:  OUTPATIENT  PARENTERAL ANTIBIOTIC THERAPY (OPAT)  Indication: MSSA PJI Regimen: Cefazolin 2g IV every 8 hours End date: 08/23/21  IV antibiotic discharge orders are pended. To discharging provider:  please sign these orders via discharge navigator,  Select New Orders & click on the button choice - Manage This Unsigned Work.     Thank you for allowing pharmacy to be a part of this patient's care.  Lawson Radar 07/17/2021, 9:15 AM

## 2021-07-17 NOTE — Progress Notes (Signed)
[facility]ious Disease  Date of Admission:  07/10/2021           Reason for visit: Follow up on PJI    Current antibiotics: Cefazolin 2/13--present  ASSESSMENT:    76 y.o. female admitted with:  Recurrent right knee MSSA PJI: Status post DAIR procedure in November 2022 with OR cultures at that time growing MSSA.  Received 6 weeks of Ancef followed by suppressive Keflex.  Infection recurred in the setting of suppression plus having ran out of her antibiotics for approximately 1 week prior to admission.  Now status post explant and antibiotic spacer 07/12/2021.  OR cultures have grown MSSA. Acute blood loss anemia: Hemoglobin remains low. Acute CKD 3: Creatinine improved today. Atrial fibrillation: Patient on Eliquis for this and rifampin was not used in the setting of her prior P JI due to this drug interaction.   RECOMMENDATIONS:    Continue cefazolin x6 weeks See OP AT note below Will sign off, please call as needed.  Diagnosis: PJI  Culture Result: MSSA  Allergies  Allergen Reactions   Other Other (See Comments)    Blood Product Refusal ([employer]ehovah's witness)     Tape Other (See Comments)    Leaves blisters and marks on the skin   Sulfa Antibiotics Rash   Uloric [Febuxostat] Rash    OPAT Orders Discharge antibiotics to be given via PICC line Discharge antibiotics: Per pharmacy protocol cefazolin  Duration: 6 weeks  End Date: 08/23/21  [facility] Care Per Protocol:  Home health RN for IV administration and teaching; PICC line care and labs.    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)   Type 2 diabetes mellitus with diabetic polyneuropathy, with long-term current use of insulin (HCC)   Refusal of blood transfusions as patient is Jehovah's Witness   Paroxysmal atrial fibrillation (HCC)   CKD (chronic kidney disease), stage III (HCC)   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)   Anemia   Acute blood loss anemia    MEDICATIONS:    Scheduled Meds:  atorvastatin  80 mg Oral QHS   Chlorhexidine Gluconate Cloth  6 each Topical Daily   empagliflozin  10 mg Oral Daily   ferrous sulfate  325 mg Oral BID WC   folic acid  1 mg Oral Daily   heparin injection (subcutaneous)  5,000 Units Subcutaneous Q12H   insulin aspart  0-15 Units Subcutaneous TID WC   insulin aspart  0-5 Units Subcutaneous QHS   insulin glargine-yfgn  40 Units Subcutaneous QHS   levothyroxine  75 mcg Oral Q0600   mouth rinse  15 mL Mouth Rinse BID   multivitamin with minerals  1 tablet Oral Daily   polyethylene glycol  17 g Oral Daily   senna  1 tablet Oral BID   sodium chloride flush  10-40 mL Intracatheter Q12H   thiamine  100 mg Oral Daily  Continuous Infusions:  sodium chloride 250 mL (07/13/21 0209)    ceFAZolin (ANCEF) IV 2 g (07/17/21 0526)   lactated ringers 10 mL/hr at 07/12/21 1516   PRN Meds:.acetaminophen, bisacodyl, diphenhydrAMINE, HYDROmorphone (DILAUDID) injection, lip balm, ondansetron **OR** ondansetron (ZOFRAN) IV, oxyCODONE, sodium chloride flush  SUBJECTIVE:   24 hour events:  No significant events Was moved out of the ICU and back to 5 N. Afebrile, Tmax 99.7 Blood cultures 2/17 are no growth OR cultures have all grown MSSA.  In addition her clinic aspirate cultures grew MSSA   Patient with no new complaints today.  Appears tired and has pain in her knee.  Discussed with her  daughter.  Review of Systems  All other systems reviewed and are negative.    OBJECTIVE:   Blood pressure (!) 127/45, pulse 83, temperature 98.9 F (37.2 C), temperature source Oral, resp. rate 19, height 5' 4"  (1.626 m), weight 98.5 kg, SpO2 93 %. Body mass index is 37.27 kg/m.  Physical Exam Constitutional:      Comments: Fatigued appearing, not very interactive, no acute distress.  Eyes:     Extraocular Movements: Extraocular movements intact.     Conjunctiva/sclera: Conjunctivae normal.  Pulmonary:     Effort: Pulmonary effort is normal. No respiratory distress.  Abdominal:     General: There is no distension.     Palpations: Abdomen is soft.  Musculoskeletal:     Cervical back: Normal range of motion and neck supple.     Comments: Right knee in immobilizer with wound VAC.  Skin:    General: Skin is warm and dry.  Neurological:     General: No focal deficit present.     Mental Status: Mental status is at baseline.  Psychiatric:        Mood and Affect: Mood normal.        Behavior: Behavior normal.     Lab Results: Lab Results  Component Value Date   WBC 11.4 (H) 07/16/2021   HGB 5.3 (LL) 07/16/2021   HCT 16.6 (L) 07/16/2021   MCV 71.6 (L) 07/16/2021   PLT 301 07/16/2021    Lab Results  Component Value Date   NA 135 07/16/2021   K 3.8 07/16/2021   CO2 24 07/16/2021   GLUCOSE 124 (H) 07/16/2021   BUN 32 (H) 07/16/2021   CREATININE 1.23 (H) 07/16/2021   CALCIUM 8.2 (L) 07/16/2021   GFRNONAA 46 (L) 07/16/2021   GFRAA 33 (L) 01/21/2020    Lab Results  Component Value Date   ALT 33 07/10/2021   AST 36 07/10/2021   ALKPHOS 70 07/10/2021   BILITOT 0.9 07/10/2021       Component Value Date/Time   CRP 28.2 (H) 07/10/2021 1404       Component Value Date/Time   ESRSEDRATE 64 (H) 07/10/2021 1404     I have reviewed the micro and lab results in Epic.  Imaging: No results found.   Imaging independently reviewed in Epic.    [facility] for Infectious Disease [facility] [phone] pager 07/17/2021, 12:36 PM  I spent greater than 35 minutes with the patient including greater than 50% of time in face to face counsel of the patient and in coordination of their care.

## 2021-07-17 NOTE — Progress Notes (Signed)
Physical Therapy Treatment Patient Details Name: Danielle [name] MRN: [id number] DOB: 07/28/1945 Today's Date: 07/17/2021   History of Present Illness Patient is a 76 y/o female admitted due to R TKA infection.  Initially had TKA 04/2018 then had pain/swelling 03/2021 with I&D and liner exchange and IV antibotics.  Admitted with worse pain and difficulty ambulating with fevers.  S/p R explant TKA w/implantation of articulating antibiotic spacer on 07/12/2021.  Post op had severe anemia with transfer to ICU due to [place] witness and refusing blood products.  Did finally have transfusion 2/17 with hemoglobin at 5.0 after transfusion.  PMH positive for HFpEF, CAD w/ multiple prior PCIs, SSS s/p PPM, Afib on AC, OSA on CPAP, CKD, HTN, DM.    PT Comments    Pt continues to be unable to transfer EOB or OOB today due to increased lethargy and fatigue. Pt able to reach across body with LUE to bedrail to assist in repositioning trunk to center as she was leaning L upon arrival. Pt declined mobility repeatedly, able to complete ankle pumps with max encouragement.  Pt repositioned in semi-chair position in bed. Pt continues to benefit from skilled PT services to progress toward functional mobility goals.    Recommendations for follow up therapy are one component of a multi-disciplinary discharge planning process, led by the attending physician.  Recommendations may be updated based on patient status, additional functional criteria and insurance authorization.  Follow Up Recommendations  Skilled nursing-short term rehab (<3 hours/day)     Assistance Recommended at Discharge Frequent or constant Supervision/Assistance  Patient can return home with the following Two people to help with walking and/or transfers;Two people to help with bathing/dressing/bathroom;Assistance with feeding   Equipment Recommendations  None recommended by PT    Recommendations for Other Services       Precautions /  Restrictions Precautions Precautions: Fall;Knee Required Braces or Orthoses: Knee Immobilizer - Right Knee Immobilizer - Right: On when out of bed or walking Restrictions Weight Bearing Restrictions: Yes RLE Weight Bearing: Partial weight bearing RLE Partial Weight Bearing Percentage or Pounds: 50     Mobility  Bed Mobility Overal bed mobility: Needs Assistance       General bed mobility comments: repositioning in semi-chair position bringing shoulders and head to R due to patient leaning L, pt able to assist with LUE and use of bed rail, pt lifting head to don bonnet    Transfers                        Ambulation/Gait                   Stairs             Wheelchair Mobility    Modified Rankin (Stroke Patients Only)       Balance                                            Cognition Arousal/Alertness: Lethargic Behavior During Therapy: Anxious Overall Cognitive Status: Impaired/Different from baseline Area of Impairment: Attention, Following commands, Safety/judgement                   Current Attention Level: Focused   Following Commands: Follows one step commands with increased time, Follows one step commands inconsistently Safety/Judgement: Decreased awareness of safety     General Comments:  irritated at times by efforts to have her move and refusing to move despite encouragement and multiple trials        Exercises General Exercises - Lower Extremity Ankle Circles/Pumps: AROM, Both, 20 reps, Supine    General Comments        Pertinent Vitals/Pain Pain Assessment Pain Assessment: Faces Faces Pain Scale: Hurts a little bit Pain Location: R LE and R shoulder with movement Pain Descriptors / Indicators: Grimacing, Guarding Pain Intervention(s): Limited activity within patient's tolerance, Monitored during session, Repositioned    Home Living                          Prior Function             PT Goals (current goals can now be found in the care plan section) Acute Rehab PT Goals Patient Stated Goal: to go to rehab PT Goal Formulation: With patient/family Time For Goal Achievement: 07/29/21    Frequency    Min 3X/week      PT Plan      Co-evaluation              AM-PAC PT "6 Clicks" Mobility   Outcome Measure  Help needed turning from your back to your side while in a flat bed without using bedrails?: Total Help needed moving from lying on your back to sitting on the side of a flat bed without using bedrails?: Total Help needed moving to and from a bed to a chair (including a wheelchair)?: Total Help needed standing up from a chair using your arms (e.g., wheelchair or bedside chair)?: Total Help needed to walk in hospital room?: Total Help needed climbing 3-5 steps with a railing? : Total 6 Click Score: 6    End of Session   Activity Tolerance: Patient limited by fatigue;Patient limited by lethargy Patient left: in bed;with call bell/phone within reach;with bed alarm set Nurse Communication: Mobility status PT Visit Diagnosis: Difficulty in walking, not elsewhere classified (R26.2);Pain;Muscle weakness (generalized) (M62.81) Pain - Right/Left: Right Pain - part of body: Knee     Time: 1220-1230 PT Time Calculation (min) (ACUTE ONLY): 10 min  Charges:  $Therapeutic Activity: 8-22 mins                     Montie Swiderski R. PTA Acute Rehabilitation Services Office: [place] 07/17/2021, 12:38 PM

## 2021-07-17 NOTE — Plan of Care (Signed)
  Problem: Clinical Measurements: Goal: Ability to maintain clinical measurements within normal limits will improve Outcome: Progressing   

## 2021-07-18 ENCOUNTER — Inpatient Hospital Stay (HOSPITAL_COMMUNITY): Payer: Medicare HMO

## 2021-07-18 DIAGNOSIS — T8453XA Infection and inflammatory reaction due to internal right knee prosthesis, initial encounter: Secondary | ICD-10-CM | POA: Diagnosis not present

## 2021-07-18 DIAGNOSIS — D62 Acute posthemorrhagic anemia: Secondary | ICD-10-CM | POA: Diagnosis not present

## 2021-07-18 DIAGNOSIS — I38 Endocarditis, valve unspecified: Secondary | ICD-10-CM

## 2021-07-18 DIAGNOSIS — I5032 Chronic diastolic (congestive) heart failure: Secondary | ICD-10-CM | POA: Diagnosis not present

## 2021-07-18 LAB — ECHOCARDIOGRAM COMPLETE
AR max vel: 1.34 cm2
AV Area VTI: 1.32 cm2
AV Area mean vel: 1.21 cm2
AV Mean grad: 8 mmHg
AV Peak grad: 12.1 mmHg
Ao pk vel: 1.74 m/s
Area-P 1/2: 3.27 cm2
Calc EF: 61.2 %
Height: 64 in
MV VTI: 0.34 cm2
P 1/2 time: 460 ms
S' Lateral: 2.8 cm
Single Plane A2C EF: 65.7 %
Single Plane A4C EF: 56.9 %
Weight: 3474.45 [oz_av]

## 2021-07-18 LAB — BPAM RBC
Blood Product Expiration Date: 202302232359
Blood Product Expiration Date: 202303242359
Blood Product Expiration Date: 202303242359
ISSUE DATE / TIME: 202302170918
ISSUE DATE / TIME: 202302170918
ISSUE DATE / TIME: 202302171237
Unit Type and Rh: 5100
Unit Type and Rh: 5100
Unit Type and Rh: 6200

## 2021-07-18 LAB — TYPE AND SCREEN
ABO/RH(D): A POS
Antibody Screen: NEGATIVE
Unit division: 0
Unit division: 0
Unit division: 0

## 2021-07-18 LAB — HEMOGLOBIN AND HEMATOCRIT, BLOOD
HCT: 17 % — ABNORMAL LOW (ref 36.0–46.0)
Hemoglobin: 5.3 g/dL — CL (ref 12.0–15.0)

## 2021-07-18 LAB — GLUCOSE, CAPILLARY
Glucose-Capillary: 87 mg/dL (ref 70–99)
Glucose-Capillary: 105 mg/dL — ABNORMAL HIGH (ref 70–99)
Glucose-Capillary: 115 mg/dL — ABNORMAL HIGH (ref 70–99)
Glucose-Capillary: 111 mg/dL — ABNORMAL HIGH (ref 70–99)

## 2021-07-18 NOTE — Progress Notes (Signed)
° °  Echocardiogram 2D Echocardiogram has been performed.  Danielle [name] 07/18/2021, 2:50 PM

## 2021-07-18 NOTE — Progress Notes (Addendum)
[facility] PROGRESS NOTE   Danielle [name] QHU:[id number] DOB: 29-Nov-1945 DOA: 07/10/2021  8 DOS: the patient was seen and examined on 07/18/2021  PCP: Seward Carol, MD  Brief History and Hospital Course:  76 year old female with past medical history significant for bilateral knee replacements with recent recurrent right knee septic arthritis, essential hypertension, CAD s/p PCI/stent, combined chronic systolic/diastolic congestive heart failure, CKD stage IIIa, history of left breast cancer, OSA on CPAP, type 2 diabetes mellitus, morbid obesity, gout, Jehovah's Witness who refuses blood products who presented to [facility] ED on 2/13 for evaluation of failed antibiotic treatment of right knee recurrent septic arthritis.  Patient initially undergone right TKA with Dr. Mardelle Matte December 2019 with development of acute pain/swelling of the right knee in November 2022 in which she was admitted to the hospital and diagnosed with a staph infection in her prosthetic knee.  She underwent irrigation debridement with liner exchange on April 14, 2021 and postoperatively treated with IV antibiotics with cefazolin with transition to Keflex thereafter.    Patient was hospitalized.  She had explant right total knee arthroplasty with implantation of articulating antibiotic spacer on 2/15.  She was initially admitted to the intensive care unit.  Was transferred to hospitalist service on 2/17 but was noted to have a significant drop in hemoglobin from 9.2-4.1.  She was transferred back to ICU.  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  She was noted to be lethargic.  Discussed with her daughter who gave consent for blood products in the ICU and so she was transfused.  Subsequently patient woke up and then declined any further transfusions.  She was stabilized and then transferred back to our service.  Hemoglobin remains low but stable.  Consultants:  Orthopedics Infectious disease PCCM    Procedures:  Explant right total knee arthroplasty with antibiotic spacer, Dr. Zachery Dakins, 2/15    Subjective: Patient denies any chest pain shortness of breath.  Continues to have some pain in the right knee.  Denies any dizziness or lightheadedness.     Assessment/Plan:   * Septic Arthritis, Infection of prosthetic right knee joint ([place]), MSSA infection Patient presenting to ED following failed outpatient treatment with antibiotics for recurrent right TKA prosthetic infection with MSSA.  Patient underwent explant right total knee arthroplasty with implantation of articulating antibiotic spacer by Dr. Zachery Dakins on 07/12/2021.    Infectious disease was consulted.  Patient transition to cefazolin.  PICC line has been placed.  Last day of treatment will be 08/23/21.  OPAT orders placed by ID.  Anemia Had a significant drop in hemoglobin from 9.2-4.1 on 2/16.  Was transferred back to the ICU. Since patient was lethargic and confused consent was obtained from daughter for blood transfusion.  She was transfused ?2 units of PRBC in the ICU.   Was 5.3 on 2/19.  Recheck today and noted to be 5.3 again.  We are avoiding inpatient blood draws.   Patient was given darbepoetin on 2/17. Its dosed every 2 weeks. She remains on iron supplements. Reason for anemia not entirely clear.  Thought to be due to blood loss from recent knee surgery. CT abdomen pelvis did not show any retroperitoneal hemorrhage.  Refusal of blood transfusions as patient is Jehovah's Witness Refuses blood transfusion.  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   She has been on heart failure medications at home including torsemide 40 mg  p.o. daily, spironolactone 12.5 mg p.o. daily, losartan 25 mg p.o. daily, carvedilol 12.5 mg p.o. twice daily. Antihypertensives were placed on hold due to hypotension.  Blood pressure remains borderline low.   Continue to hold these medications for now.  Vania Rea is being continued.  Essential hypertension- (present on admission) Antihypertensives on hold due to hypotension.  Continue to monitor for now.    Type 2 diabetes mellitus with diabetic polyneuropathy, with long-term current use of insulin (HCC) HbA1c 8.2.  Remains on SSI and glargine insulin.  Monitor CBGs.    Paroxysmal atrial fibrillation ([place])- (present on admission) Patient on carvedilol 12.5 mg p.o. twice daily and Eliquis at home.  Due to significant blood loss and severe anemia Eliquis remains on hold.  Seems to be tolerating subcutaneous heparin well.   CKD (chronic kidney disease), stage III ([place])- (present on admission) Renal function is stable.  Monitor urine output.  Labs being checked infrequently due to her severe anemia and Jehovah's Witness status refusing blood transfusion.  Obesity (BMI 30-39.9)- (present on admission) Estimated body mass index is 37.27 kg/m as calculated from the following:   Height as of this encounter: 5\' 4"  (1.626 m).   Weight as of this encounter: 98.5 kg.   Hypothyroidism- (present on admission) Continue levothyroxine.   DVT Prophylaxis: Subcutaneous heparin Code Status: Full code Family Communication: Discussed with daughter yesterday.  No family at bedside today. Disposition Plan: SNF when medically stable  Status is: Inpatient Remains inpatient appropriate because: Severe anemia, knee infection    Medications: Scheduled:  atorvastatin  80 mg Oral QHS   Chlorhexidine Gluconate Cloth  6 each Topical Daily   empagliflozin  10 mg Oral Daily   ferrous sulfate  325 mg Oral BID WC   folic acid  1 mg Oral Daily   heparin injection (subcutaneous)  5,000 Units Subcutaneous Q12H   insulin aspart  0-15 Units Subcutaneous TID WC   insulin aspart  0-5 Units Subcutaneous QHS   insulin glargine-yfgn  40 Units Subcutaneous QHS   levothyroxine  75 mcg Oral Q0600   mouth rinse  15 mL  Mouth Rinse BID   multivitamin with minerals  1 tablet Oral Daily   polyethylene glycol  17 g Oral Daily   senna  1 tablet Oral BID   sodium chloride flush  10-40 mL Intracatheter Q12H   thiamine  100 mg Oral Daily   Continuous:  sodium chloride 250 mL (07/13/21 0209)    ceFAZolin (ANCEF) IV 2 g (07/18/21 0607)   lactated ringers 10 mL/hr at 07/12/21 1516   ZSW:[id number], bisacodyl, diphenhydrAMINE, HYDROmorphone (DILAUDID) injection, lip balm, ondansetron **OR** ondansetron (ZOFRAN) IV, oxyCODONE, sodium chloride flush  Antibiotics: Anti-infectives (From admission, onward)    Start     Dose/Rate Route Frequency Ordered Stop   07/12/21 1916  tobramycin (NEBCIN) powder  Status:  Discontinued          As needed 07/12/21 1917 07/12/21 2124   07/12/21 1915  vancomycin (VANCOCIN) powder  Status:  Discontinued          As needed 07/12/21 1915 07/12/21 2124   07/12/21 1745  vancomycin (VANCOCIN) IVPB 1000 mg/200 mL premix        1,000 mg 200 mL/hr over 60 Minutes Intravenous  Once 07/12/21 1741 07/12/21 1759   07/12/21 1710  vancomycin (VANCOCIN) 1-5 GM/200ML-% IVPB       Note to Pharmacy: Hoy Morn E: cabinet override      07/12/21 1710 07/12/21 1742  07/10/21 1400  ceFAZolin (ANCEF) IVPB 2g/100 mL premix        2 g 200 mL/hr over 30 Minutes Intravenous Every 8 hours 07/10/21 1258         Objective:  Vital Signs  Vitals:   07/17/21 1639 07/17/21 2100 07/18/21 0440 07/18/21 0444  BP: (!) 109/45 113/73 (!) 107/43   Pulse: 84 81  81  Resp:  18 13 (!) 25  Temp: 98 F (36.7 C) 99.4 F (37.4 C)    TempSrc:  Oral    SpO2: 94% 96%  94%  Weight:      Height:        Intake/Output Summary (Last 24 hours) at 07/18/2021 1128 Last data filed at 07/17/2021 1841 Gross per 24 hour  Intake 10 ml  Output 400 ml  Net -390 ml   Filed Weights   07/15/21 0500 07/16/21 0500 07/17/21 0500  Weight: 98.3 kg 98 kg 98.5 kg    General appearance: Awake alert.  In no  distress Resp: Clear to auscultation bilaterally.  Normal effort Cardio: S1-S2 is normal regular.  No S3-S4.  No rubs murmurs or bruit GI: Abdomen is soft.  Nontender nondistended.  Bowel sounds are present normal.  No masses organomegaly Extremities: Knee immobilizer noted on the right lower extremity Neurologic:  No focal neurological deficits.      Lab Results:  Data Reviewed: I have personally reviewed labs and imaging study reports  CBC: Recent Labs  Lab 07/12/21 0343 07/14/21 0407 07/14/21 0640 07/14/21 1439 07/15/21 0621 07/16/21 0434 07/17/21 1659 07/18/21 0244  WBC 8.7 9.2  --  9.4 9.5 11.4* 10.1  --   NEUTROABS 5.1 5.9  --   --  6.3  --   --   --   HGB 9.2* 4.1*   < > 4.9* 5.0* 5.3* 5.3* 5.3*  HCT 30.1* 13.4*   < > 15.4* 15.4* 16.6* 16.8* 17.0*  MCV 67.9* 68.7*  --  72.3* 73.0* 71.6* 74.7*  --   PLT 230 176  --  193 206 301 361  --    < > = values in this interval not displayed.    Basic Metabolic Panel: Recent Labs  Lab 07/12/21 0343 07/13/21 0834 07/14/21 0407 07/15/21 0621 07/16/21 0434  NA 137 137 137 135 135  K 3.9 4.8 4.3 3.6 3.8  CL 99 101 100 97* 98  CO2 28 26 28 27 24   GLUCOSE 116* 176* 144* 152* 124*  BUN 19 25* 31* 37* 32*  CREATININE 1.36* 1.41* 1.59* 1.56* 1.23*  CALCIUM 8.7* 7.9* 8.1* 7.8* 8.2*  MG  --   --   --  2.3  --   PHOS  --   --   --  2.1*  --     GFR: Estimated Creatinine Clearance: 45.7 mL/min (A) (by C-G formula based on SCr of 1.23 mg/dL (H)).    CBG: Recent Labs  Lab 07/17/21 0716 07/17/21 1140 07/17/21 1639 07/17/21 2133 07/18/21 0759  GLUCAP 98 73 128* 98 87      Recent Results (from the past 240 hour(s))  Surgical pcr screen     Status: None   Collection Time: 07/11/21  3:28 PM   Specimen: Nasopharyngeal Swab; Nasal Swab  Result Value Ref Range Status   MRSA, PCR NEGATIVE NEGATIVE Final   Staphylococcus aureus NEGATIVE NEGATIVE Final    Comment: (NOTE) The Xpert SA Assay (FDA approved for NASAL  specimens in patients 38 years of age and older),  is one component of a comprehensive surveillance program. It is not intended to diagnose infection nor to guide or monitor treatment. Performed at [facility], [place] [address], [place], [place] [address]   SARS CORONAVIRUS 2 (TAT 6-24 HRS) Nasopharyngeal Nasopharyngeal Swab     Status: None   Collection Time: 07/11/21  3:42 PM   Specimen: Nasopharyngeal Swab  Result Value Ref Range Status   SARS Coronavirus 2 NEGATIVE NEGATIVE Final    Comment: (NOTE) SARS-CoV-2 target nucleic acids are NOT DETECTED.  The SARS-CoV-2 RNA is generally detectable in upper and lower respiratory specimens during the acute phase of infection. Negative results do not preclude SARS-CoV-2 infection, do not rule out co-infections with other pathogens, and should not be used as the sole basis for treatment or other patient management decisions. Negative results must be combined with clinical observations, patient history, and epidemiological information. The expected result is Negative.  Fact Sheet for Patients: SugarRoll.be  Fact Sheet for Healthcare Providers: https://www.woods-mathews.com/  This test is not yet approved or cleared by the [place] FDA and  has been authorized for detection and/or diagnosis of SARS-CoV-2 by FDA under an Emergency Use Authorization (EUA). This EUA will remain  in effect (meaning this test can be used) for the duration of the COVID-19 declaration under Se ction 564(b)(1) of the Act, 21 U.S.C. section 360bbb-3(b)(1), unless the authorization is terminated or revoked sooner.  Performed at [facility], [place] [address] [place], [place] [address]   Aerobic/Anaerobic Culture w Gram Stain (surgical/deep wound)     Status: None   Collection Time: 07/12/21  5:39 PM   Specimen: Soft Tissue, Other  Result Value Ref Range Status   Specimen Description TISSUE  Final    Special Requests RIGHT KNEE  Final   Gram Stain   Final    ABUNDANT WBC PRESENT,BOTH PMN AND MONONUCLEAR NO ORGANISMS SEEN    Culture   Final    RARE STAPHYLOCOCCUS AUREUS NOTIFIED DR. Juleen China VIA EPIC CHAT OF CULTURE GROWTH, AT 1048 07/14/21 D. VANHOOK NO ANAEROBES ISOLATED Performed at [facility], [place] [address] [place], [place] [address]    Report Status 07/17/2021 FINAL  Final   Organism ID, Bacteria STAPHYLOCOCCUS AUREUS  Final      Susceptibility   Staphylococcus aureus - MIC*    CIPROFLOXACIN <=0.5 SENSITIVE Sensitive     ERYTHROMYCIN <=0.25 SENSITIVE Sensitive     GENTAMICIN <=0.5 SENSITIVE Sensitive     OXACILLIN 0.5 SENSITIVE Sensitive     TETRACYCLINE <=1 SENSITIVE Sensitive     VANCOMYCIN 1 SENSITIVE Sensitive     TRIMETH/SULFA <=10 SENSITIVE Sensitive     CLINDAMYCIN <=0.25 SENSITIVE Sensitive     RIFAMPIN <=0.5 SENSITIVE Sensitive     Inducible Clindamycin NEGATIVE Sensitive     * RARE STAPHYLOCOCCUS AUREUS  Aerobic/Anaerobic Culture w Gram Stain (surgical/deep wound)     Status: None   Collection Time: 07/12/21  5:40 PM   Specimen: Soft Tissue, Other  Result Value Ref Range Status   Specimen Description TISSUE  Final   Special Requests RIGHT KNEE  Final   Gram Stain   Final    ABUNDANT WBC PRESENT,BOTH PMN AND MONONUCLEAR NO ORGANISMS SEEN    Culture   Final    RARE STAPHYLOCOCCUS AUREUS NO ANAEROBES ISOLATED Performed at [facility], [place] [address], [place], [place] [address]    Report Status 07/17/2021 FINAL  Final   Organism ID, Bacteria STAPHYLOCOCCUS AUREUS  Final      Susceptibility   Staphylococcus aureus - MIC*    CIPROFLOXACIN <=0.5 SENSITIVE Sensitive     ERYTHROMYCIN <=0.25 SENSITIVE Sensitive     GENTAMICIN <=0.5 SENSITIVE Sensitive     OXACILLIN <=0.25 SENSITIVE Sensitive     TETRACYCLINE <=1 SENSITIVE Sensitive     VANCOMYCIN 1 SENSITIVE Sensitive     TRIMETH/SULFA <=10 SENSITIVE Sensitive     CLINDAMYCIN <=0.25 SENSITIVE  Sensitive     RIFAMPIN <=0.5 SENSITIVE Sensitive     Inducible Clindamycin NEGATIVE Sensitive     * RARE STAPHYLOCOCCUS AUREUS  Aerobic/Anaerobic Culture w Gram Stain (surgical/deep wound)     Status: None   Collection Time: 07/12/21  5:40 PM   Specimen: Soft Tissue, Other  Result Value Ref Range Status   Specimen Description TISSUE  Final   Special Requests RIGHT KNEE  Final   Gram Stain   Final    FEW WBC PRESENT,BOTH PMN AND MONONUCLEAR NO ORGANISMS SEEN    Culture   Final    RARE STAPHYLOCOCCUS AUREUS NO ANAEROBES ISOLATED Performed at [facility], [place] [address] [place], [place] [address]    Report Status 07/17/2021 FINAL  Final   Organism ID, Bacteria STAPHYLOCOCCUS AUREUS  Final      Susceptibility   Staphylococcus aureus - MIC*    CIPROFLOXACIN <=0.5 SENSITIVE Sensitive     ERYTHROMYCIN <=0.25 SENSITIVE Sensitive     GENTAMICIN <=0.5 SENSITIVE Sensitive     OXACILLIN 0.5 SENSITIVE Sensitive     TETRACYCLINE <=1 SENSITIVE Sensitive     VANCOMYCIN 1 SENSITIVE Sensitive     TRIMETH/SULFA <=10 SENSITIVE Sensitive     CLINDAMYCIN <=0.25 SENSITIVE Sensitive     RIFAMPIN <=0.5 SENSITIVE Sensitive     Inducible Clindamycin NEGATIVE Sensitive     * RARE STAPHYLOCOCCUS AUREUS  MRSA Next Gen by PCR, Nasal     Status: None   Collection Time: 07/13/21  4:16 AM   Specimen: Nasal Mucosa; Nasal Swab  Result Value Ref Range Status   MRSA by PCR Next Gen NOT DETECTED NOT DETECTED Final    Comment: (NOTE) The GeneXpert MRSA Assay (FDA approved for NASAL specimens only), is one component of a comprehensive MRSA colonization surveillance program. It is not intended to diagnose MRSA infection nor to guide or monitor treatment for MRSA infections. Test performance is not FDA approved in patients less than 79 years old. Performed at [facility], [place] [address] [place], [place] [address]   Culture, blood (Routine X 2) w Reflex to ID Panel     Status: None (Preliminary  result)   Collection Time: 07/14/21  2:50 PM   Specimen: BLOOD RIGHT HAND  Result Value Ref Range Status   Specimen Description BLOOD RIGHT HAND  Final   Special Requests   Final    BOTTLES DRAWN AEROBIC AND ANAEROBIC Blood Culture adequate volume   Culture   Final    NO GROWTH 4 DAYS Performed at [facility], [place] [address] [place],  [address]    Report Status PENDING  Incomplete  Culture, blood (Routine X 2) w Reflex to ID Panel     Status: None (Preliminary result)   Collection Time: 07/14/21  3:05 PM   Specimen: BLOOD LEFT HAND  Result Value Ref Range Status   Specimen Description BLOOD LEFT HAND  Final   Special Requests   Final    BOTTLES DRAWN AEROBIC ONLY Blood Culture results may not be  optimal due to an inadequate volume of blood received in culture bottles   Culture   Final    NO GROWTH 4 DAYS Performed at [facility], [place] [address], [place], [place] [address]    Report Status PENDING  Incomplete      Radiology Studies: No results found.     LOS: 8 days   Tanza Pellot [employer] on [email or web address]  07/18/2021, 11:28 AM

## 2021-07-18 NOTE — Progress Notes (Signed)
Refused cpap.

## 2021-07-18 NOTE — TOC Initial Note (Signed)
Transition of Care [facility]) - Initial/Assessment Note    Patient Details  Name: Danielle [name] MRN: [id number] Date of Birth: 08-May-1946  Transition of [facility]) CM/SW Contact:    Danielle Chars, LCSW Phone Number: 07/18/2021, 11:46 AM  Clinical Narrative:     CSW spoke with pt, A and O x1, pt not able to focus on conversation, does give permission for CSW to speak with daughter Danielle [name], unable to discuss DC plan but CSW did leave SNF choice document.  CSW spoke with daughter Danielle [name] briefly as she said she would need to call CSW back.  She is in agreement with plan for SNF, permission given to send out referral in hub.  Referral sent out in hub.               Expected Discharge Plan: Skilled Nursing Facility Barriers to Discharge: Continued Medical Work up, SNF Pending bed offer   Patient Goals and CMS Choice   CMS Medicare.gov Compare Post Acute Care list provided to:: Patient Choice offered to / list presented to : Patient  Expected Discharge Plan and Services Expected Discharge Plan: [place] In-house Referral: Clinical Social Work   Post Acute Care Choice: [place] Living arrangements for the past 2 months: Apartment                                      Prior Living Arrangements/Services Living arrangements for the past 2 months: Apartment Lives with:: Self Patient language and need for interpreter reviewed:: Yes        Need for Family Participation in Patient Care: Yes (Comment) Care giver support system in place?: Yes (comment) Current home services: Other (comment) (none) Criminal Activity/Legal Involvement Pertinent to Current Situation/Hospitalization: No - Comment as needed  Activities of Daily Living Home Assistive Devices/Equipment: Bedside commode/3-in-1, CBG Meter, Walker (specify type) ADL Screening (condition at time of admission) Patient's cognitive ability adequate to safely complete daily activities?: No Is  the patient deaf or have difficulty hearing?: No Does the patient have difficulty seeing, even when wearing glasses/contacts?: Yes Does the patient have difficulty concentrating, remembering, or making decisions?: No Patient able to express need for assistance with ADLs?: Yes Does the patient have difficulty dressing or bathing?: Yes Independently performs ADLs?: No Communication: Independent Grooming: Needs assistance Is this a change from baseline?: Pre-admission baseline Feeding: Independent Bathing: Needs assistance Is this a change from baseline?: Pre-admission baseline Toileting: Needs assistance Is this a change from baseline?: Pre-admission baseline In/Out Bed: Dependent Is this a change from baseline?: Change from baseline, expected to last >3 days Walks in Home: Needs assistance Is this a change from baseline?: Change from baseline, expected to last >3 days Does the patient have difficulty walking or climbing stairs?: Yes Weakness of Legs: Left Weakness of Arms/Hands: None  Permission Sought/Granted Permission sought to share information with : Family Supports Permission granted to share information with : Yes, Verbal Permission Granted  Share Information with NAME: daughter Danielle [name]           Emotional Assessment Appearance:: Appears stated age Attitude/Demeanor/Rapport: Lethargic Affect (typically observed): Pleasant Orientation: : Oriented to Self Alcohol / Substance Use: Not Applicable Psych Involvement: No (comment)  Admission diagnosis:  Complication of internal right knee prosthesis ([place]) [[id number], [place] Patient Active Problem List   Diagnosis Date Noted   Anemia 07/14/2021   Acute blood loss anemia    Septic  Arthritis, Infection of prosthetic right knee joint ([place]), MSSA infection 88/71/9597   Complication of internal right knee prosthesis ([place]) 07/10/2021   CAD (coronary artery disease) 06/08/2021   Carotid artery disease ([place]) 06/08/2021    Symptomatic bradycardia 06/08/2021   Refusal of blood transfusions as patient is Jehovah's Witness 06/08/2021   Paroxysmal atrial fibrillation ([place]) 06/08/2021   CKD (chronic kidney disease), stage III ([place]) 06/08/2021   Polyneuropathy associated with underlying disease ([place]) 05/04/2020   Severe nonproliferative diabetic retinopathy of left eye, with macular edema, associated with type 2 diabetes mellitus ([place]) 11/05/2019   Severe nonproliferative diabetic retinopathy of right eye, with macular edema, associated with type 2 diabetes mellitus ([place]) 11/05/2019   Retinal hemorrhage of right eye 11/05/2019   Retinal hemorrhage of left eye 11/05/2019   Diabetes mellitus ([place]) 05/14/2019   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, with long-term current use of insulin ([place]) 05/14/2019   Type 2 diabetes mellitus with diabetic polyneuropathy, with long-term current use of insulin ([place]) 05/14/2019   History of gout 05/20/2018   Primary localized osteoarthritis of right knee 05/13/2018   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 [place]) [place] [address] Phone: [phone] Fax: [phone]     Social Determinants of Health (SDOH) Interventions    Readmission Risk Interventions No flowsheet data found.

## 2021-07-18 NOTE — [place] FL2 (Signed)
[place] MEDICAID FL2 LEVEL OF CARE SCREENING TOOL     IDENTIFICATION  Patient Name: Danielle [name] Birthdate: Oct 05, 1945 Sex: female Admission Date (Current Location): 07/10/2021  [facility] and [place] Number:  Herbalist and Address:  [facility] [place], [place] [address] [place], [place] [address]      Provider Number: [id number]  Attending Physician Name and Address:  Bonnielee Haff, MD  Relative Name and Phone Number:  Saumya, Hukill Daughter [phone]    Current Level of Care: Hospital Recommended Level of Care: [place] Prior Approval Number:    Date Approved/Denied:   PASRR Number: [id number]  Discharge Plan: SNF    Current Diagnoses: Patient Active Problem List   Diagnosis Date Noted   Anemia 07/14/2021   Acute blood loss anemia    Septic Arthritis, Infection of prosthetic right knee joint ([place]), MSSA infection 65/68/1275   Complication of internal right knee prosthesis ([place]) 07/10/2021   CAD (coronary artery disease) 06/08/2021   Carotid artery disease ([place]) 06/08/2021   Symptomatic bradycardia 06/08/2021   Refusal of blood transfusions as patient is Jehovah's Witness 06/08/2021   Paroxysmal atrial fibrillation ([place]) 06/08/2021   CKD (chronic kidney disease), stage III ([place]) 06/08/2021   Polyneuropathy associated with underlying disease ([place]) 05/04/2020   Severe nonproliferative diabetic retinopathy of left eye, with macular edema, associated with type 2 diabetes mellitus ([place]) 11/05/2019   Severe nonproliferative diabetic retinopathy of right eye, with macular edema, associated with type 2 diabetes mellitus ([place]) 11/05/2019   Retinal hemorrhage of right eye 11/05/2019   Retinal hemorrhage of left eye 11/05/2019   Diabetes mellitus ([place]) 05/14/2019   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, with long-term current use of insulin ([place]) 05/14/2019   Type 2 diabetes mellitus with diabetic polyneuropathy,  with long-term current use of insulin ([place]) 05/14/2019   History of gout 05/20/2018   Primary localized osteoarthritis of right knee 05/13/2018   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) 12/20/2017   Obesity (BMI 30-39.9) 11/11/2017   OSA (obstructive sleep apnea) 02/18/2017   Essential hypertension    L Breast Cancer    Hypothyroidism     Orientation RESPIRATION BLADDER Height & Weight     Self  Normal Incontinent, External catheter Weight: 217 lb 2.5 oz (98.5 kg) Height:  5\' 4"  (162.6 cm)  BEHAVIORAL SYMPTOMS/MOOD NEUROLOGICAL BOWEL NUTRITION STATUS      Continent Diet (see discharge summary)  AMBULATORY STATUS COMMUNICATION OF NEEDS Skin   Total Care Verbally Normal                       Personal Care Assistance Level of Assistance  Bathing, Feeding, Dressing, Total care Bathing Assistance: Maximum assistance Feeding assistance: Limited assistance Dressing Assistance: Maximum assistance Total Care Assistance: Maximum assistance   Functional Limitations Info  Sight, Hearing, Speech Sight Info: Adequate Hearing Info: Adequate Speech Info: Adequate    SPECIAL CARE FACTORS FREQUENCY  PT (By licensed PT), OT (By licensed OT)     PT Frequency: 5x week OT Frequency: 5x week            Contractures Contractures Info: Not present    Additional Factors Info  Code Status, Allergies, Insulin Sliding Scale Code Status Info: full Allergies Info: Other, Tape, Sulfa Antibiotics, Uloric (Febuxostat)   Insulin Sliding Scale Info: Novolog, 0-15 units 3x day with meals, 0-5 qhs.  See discharge summary       Current Medications (07/18/2021):  This  is the current hospital active medication list Current Facility-Administered Medications  Medication Dose Route Frequency Provider Last Rate Last Admin   0.9 %  sodium chloride infusion  250 mL Intravenous Continuous Minor, Grace Bushy, NP 20 mL/hr at 07/13/21 0209 250 mL at 07/13/21 0209   acetaminophen (TYLENOL) tablet  325-650 mg  325-650 mg Oral Q6H PRN Minor, Grace Bushy, NP   650 mg at 07/16/21 0906   atorvastatin (LIPITOR) tablet 80 mg  80 mg Oral QHS Minor, Grace Bushy, NP   80 mg at 07/17/21 2157   bisacodyl (DULCOLAX) EC tablet 5 mg  5 mg Oral Daily PRN Minor, Grace Bushy, NP   5 mg at 07/16/21 0906   ceFAZolin (ANCEF) IVPB 2g/100 mL premix  2 g Intravenous Q8H Minor, Grace Bushy, NP 200 mL/hr at 07/18/21 0607 2 g at 07/18/21 5885   Chlorhexidine Gluconate Cloth 2 % PADS 6 each  6 each Topical Daily Minor, Grace Bushy, NP   6 each at 07/17/21 1003   diphenhydrAMINE (BENADRYL) 12.5 MG/5ML elixir 12.5-25 mg  12.5-25 mg Oral Q4H PRN Minor, Grace Bushy, NP   25 mg at 07/18/21 0026   empagliflozin (JARDIANCE) tablet 10 mg  10 mg Oral Daily Minor, Grace Bushy, NP   10 mg at 07/18/21 0277   ferrous sulfate tablet 325 mg  325 mg Oral BID WC Ollis, Brandi L, NP   325 mg at 41/28/78 6767   folic acid (FOLVITE) tablet 1 mg  1 mg Oral Daily Ollis, Brandi L, NP   1 mg at 07/18/21 0936   heparin injection 5,000 Units  5,000 Units Subcutaneous Q12H Julian Hy, DO   5,000 Units at 07/18/21 2094   HYDROmorphone (DILAUDID) injection 0.25-0.5 mg  0.25-0.5 mg Intravenous Q4H PRN Minor, Grace Bushy, NP   0.5 mg at 07/18/21 1043   insulin aspart (novoLOG) injection 0-15 Units  0-15 Units Subcutaneous TID WC Minor, Grace Bushy, NP   2 Units at 07/16/21 1748   insulin aspart (novoLOG) injection 0-5 Units  0-5 Units Subcutaneous QHS Minor, Grace Bushy, NP       insulin glargine-yfgn (SEMGLEE) injection 40 Units  40 Units Subcutaneous QHS Minor, Grace Bushy, NP   40 Units at 07/17/21 2159   lactated ringers infusion   Intravenous Continuous Minor, Grace Bushy, NP 10 mL/hr at 07/12/21 1516 New Bag at 07/12/21 2043   levothyroxine (SYNTHROID) tablet 75 mcg  75 mcg Oral Q0600 Minor, Grace Bushy, NP   75 mcg at 07/18/21 7096   lip balm (CARMEX) ointment   Topical PRN Freddi Starr, MD       MEDLINE mouth rinse  15 mL Mouth Rinse BID Freddi Starr,  MD   15 mL at 07/18/21 2836   multivitamin with minerals tablet 1 tablet  1 tablet Oral Daily [name] L, NP   1 tablet at 07/18/21 0935   ondansetron (ZOFRAN) tablet 4 mg  4 mg Oral Q6H PRN Minor, Grace Bushy, NP       Or   ondansetron (ZOFRAN) injection 4 mg  4 mg Intravenous Q6H PRN Minor, Grace Bushy, NP       oxyCODONE (Oxy IR/ROXICODONE) immediate release tablet 5 mg  5 mg Oral Q4H PRN [name] L, NP   5 mg at 07/18/21 0606   polyethylene glycol (MIRALAX / GLYCOLAX) packet 17 g  17 g Oral Daily Noemi Chapel P, DO   17 g at 07/18/21 6294   senna (SENOKOT) tablet  8.6 mg  1 tablet Oral BID Minor, Grace Bushy, NP   8.6 mg at 07/18/21 0935   sodium chloride flush (NS) 0.9 % injection 10-40 mL  10-40 mL Intracatheter Q12H Minor, Grace Bushy, NP   10 mL at 07/16/21 2150   sodium chloride flush (NS) 0.9 % injection 10-40 mL  10-40 mL Intracatheter PRN Minor, Grace Bushy, NP   10 mL at 07/17/21 1706   thiamine tablet 100 mg  100 mg Oral Daily [name] L, NP   100 mg at 07/18/21 6387     Discharge Medications: Please see discharge summary for a list of discharge medications.  Relevant Imaging Results:  Relevant Lab Results:   Additional Information SSN# [id number] Pt vaccinated and had one booster  Joanne Chars, LCSW

## 2021-07-18 NOTE — Assessment & Plan Note (Addendum)
Continue Synthroid °

## 2021-07-19 ENCOUNTER — Ambulatory Visit: Payer: Medicare HMO | Admitting: Internal Medicine

## 2021-07-19 DIAGNOSIS — T8453XA Infection and inflammatory reaction due to internal right knee prosthesis, initial encounter: Secondary | ICD-10-CM | POA: Diagnosis not present

## 2021-07-19 LAB — CULTURE, BLOOD (ROUTINE X 2)
Culture: NO GROWTH
Culture: NO GROWTH
Special Requests: ADEQUATE

## 2021-07-19 LAB — GLUCOSE, CAPILLARY
Glucose-Capillary: 76 mg/dL (ref 70–99)
Glucose-Capillary: 87 mg/dL (ref 70–99)
Glucose-Capillary: 87 mg/dL (ref 70–99)
Glucose-Capillary: 99 mg/dL (ref 70–99)

## 2021-07-19 NOTE — Progress Notes (Signed)
Subjective: 7 Days Post-Op s/p Procedure(s): EXCISIONAL TOTAL KNEE ARTHROPLASTY WITH ANTIBIOTIC SPACERS Doing well on the floor. Tolerated dressing change without issues as well as gentle PROM of the knee. Encouraged her to continue to work with PT on mobility as tolerated. KI left off following dressing change.   Objective:  PE: VITALS:   Vitals:   07/18/21 1256 07/19/21 0423 07/19/21 0500 07/19/21 0811  BP: (!) 101/42 (!) 92/40  (!) 102/40  Pulse:  73  74  Resp:  18  17  Temp: 99.4 F (37.4 C) 98.9 F (37.2 C)  98.8 F (37.1 C)  TempSrc: Oral Oral  Oral  SpO2:  97%  99%  Weight:   103.3 kg   Height:       Neurovascular intact Sensation intact distally Dorsiflexion/Plantar flexion intact Incision: dressing removed. Incision c/d/I. No erythema, no drainage. Tolerated knee ROM 0-60. Compartment soft Swelling of the left ankle Endorses distal sensation  LABS  Results for orders placed or performed during the hospital encounter of 07/10/21 (from the past 24 hour(s))  Glucose, capillary     Status: Abnormal   Collection Time: 07/18/21  1:02 PM  Result Value Ref Range   Glucose-Capillary 105 (H) 70 - 99 mg/dL  Glucose, capillary     Status: Abnormal   Collection Time: 07/18/21  3:46 PM  Result Value Ref Range   Glucose-Capillary 115 (H) 70 - 99 mg/dL  Glucose, capillary     Status: Abnormal   Collection Time: 07/18/21  7:34 PM  Result Value Ref Range   Glucose-Capillary 111 (H) 70 - 99 mg/dL  Glucose, capillary     Status: None   Collection Time: 07/19/21  8:18 AM  Result Value Ref Range   Glucose-Capillary 76 70 - 99 mg/dL  Glucose, capillary     Status: None   Collection Time: 07/19/21 11:53 AM  Result Value Ref Range   Glucose-Capillary 87 70 - 99 mg/dL    ECHOCARDIOGRAM COMPLETE  Result Date: 07/18/2021    ECHOCARDIOGRAM REPORT   Patient Name:   Danielle [name] Date of Exam: 07/18/2021 Medical Rec #:  [id number]     Height:       64.0 in Accession #:     [id number]    Weight:       217.2 lb Date of Birth:  July 05, 1945     BSA:          2.026 m Patient Age:    76 years      BP:           101/42 mmHg Patient Gender: F             HR:           80 bpm. Exam Location:  Inpatient Procedure: 2D Echo, Cardiac Doppler and Color Doppler Indications:    I38 ENDOCARDITIS  History:        Patient has prior history of Echocardiogram examinations, most                 recent 04/29/2020. CHF, Signs/Symptoms:Dyspnea; Risk                 Factors:Hypertension and Diabetes. CAD / CHEMO.  Sonographer:    Beryle Beams Referring Phys: [phone] LINDSAY NICOLE Finley  1. Left ventricular ejection fraction, by estimation, is 40 to 45%. The left ventricle has mildly decreased function. The left ventricle demonstrates regional wall motion abnormalities (see scoring diagram/findings for description). Left ventricular  diastolic parameters are consistent with Grade II diastolic dysfunction (pseudonormalization). Elevated left atrial pressure.  2. Right ventricular systolic function is normal. The right ventricular size is normal.  3. Left atrial size was severely dilated.  4. The mitral valve is grossly normal. Mild to moderate mitral valve regurgitation. The mean mitral valve gradient is 5.5 mmHg with average heart rate of 82 bpm. Moderate mitral annular calcification.  5. Tricuspid valve regurgitation is moderate.  6. The aortic valve is tricuspid. There is mild calcification of the aortic valve. There is mild thickening of the aortic valve. Aortic valve regurgitation is mild. Aortic valve sclerosis/calcification is present, without any evidence of aortic stenosis.  7. The inferior vena cava is dilated in size with <50% respiratory variability, suggesting right atrial pressure of 15 mmHg. Comparison(s): Prior images reviewed side by side. The left ventricular function is worsened. The left ventricular wall motion abnormality is worse. The distribution of the wall motion abnormality is  the same, but the reduction in contractility is worse. FINDINGS  Left Ventricle: Left ventricular ejection fraction, by estimation, is 40 to 45%. The left ventricle has mildly decreased function. The left ventricle demonstrates regional wall motion abnormalities. The left ventricular internal cavity size was normal in size. There is borderline concentric left ventricular hypertrophy. Left ventricular diastolic parameters are consistent with Grade II diastolic dysfunction (pseudonormalization). Elevated left atrial pressure.  LV Wall Scoring: The apical septal segment, apical inferior segment, and apex are akinetic. The apical anterior segment is hypokinetic. Right Ventricle: The right ventricular size is normal. Right vetricular wall thickness was not well visualized. Right ventricular systolic function is normal. Left Atrium: Left atrial size was severely dilated. Right Atrium: Right atrial size was normal in size. Pericardium: There is no evidence of pericardial effusion. Mitral Valve: The mitral valve is grossly normal. There is mild thickening of the mitral valve leaflet(s). Moderate mitral annular calcification. Mild to moderate mitral valve regurgitation, with centrally-directed jet. MV peak gradient, 69.2 mmHg. The mean mitral valve gradient is 5.5 mmHg with average heart rate of 82 bpm. Tricuspid Valve: The tricuspid valve is normal in structure. Tricuspid valve regurgitation is moderate. Aortic Valve: The aortic valve is tricuspid. There is mild calcification of the aortic valve. There is mild thickening of the aortic valve. Aortic valve regurgitation is mild. Aortic regurgitation PHT measures 460 msec. Aortic valve sclerosis/calcification is present, without any evidence of aortic stenosis. Aortic valve mean gradient measures 8.0 mmHg. Aortic valve peak gradient measures 12.1 mmHg. Aortic valve area, by VTI measures 1.32 cm. Pulmonic Valve: The pulmonic valve was grossly normal. Pulmonic valve  regurgitation is not visualized. Aorta: The aortic root and ascending aorta are structurally normal, with no evidence of dilitation. Venous: The inferior vena cava is dilated in size with less than 50% respiratory variability, suggesting right atrial pressure of 15 mmHg. IAS/Shunts: No atrial level shunt detected by color flow Doppler.  LEFT VENTRICLE PLAX 2D LVIDd:         5.10 cm     Diastology LVIDs:         2.80 cm     LV e' medial:    4.05 cm/s LV PW:         1.30 cm     LV E/e' medial:  37.0 LV IVS:        1.10 cm     LV e' lateral:   5.03 cm/s LVOT diam:     1.90 cm     LV E/e' lateral:  29.8 LV SV:         49 LV SV Index:   24 LVOT Area:     2.84 cm  LV Volumes (MOD) LV vol d, MOD A2C: 78.4 ml LV vol d, MOD A4C: 89.4 ml LV vol s, MOD A2C: 26.9 ml LV vol s, MOD A4C: 38.5 ml LV SV MOD A2C:     51.5 ml LV SV MOD A4C:     89.4 ml LV SV MOD BP:      52.0 ml RIGHT VENTRICLE RV S prime:     12.90 cm/s TAPSE (M-mode): 2.0 cm LEFT ATRIUM             Index LA diam:        5.30 cm 2.62 cm/m LA Vol (A2C):   89.2 ml 44.03 ml/m LA Vol (A4C):   88.9 ml 43.88 ml/m LA Biplane Vol: 92.3 ml 45.56 ml/m  AORTIC VALVE                     PULMONIC VALVE AV Area (Vmax):    1.34 cm      PV Vmax:       0.60 m/s AV Area (Vmean):   1.21 cm      PV Vmean:      41.600 cm/s AV Area (VTI):     1.32 cm      PV VTI:        0.143 m AV Vmax:           174.00 cm/s   PV Peak grad:  1.4 mmHg AV Vmean:          132.000 cm/s  PV Mean grad:  1.0 mmHg AV VTI:            0.371 m AV Peak Grad:      12.1 mmHg AV Mean Grad:      8.0 mmHg LVOT Vmax:         82.50 cm/s LVOT Vmean:        56.300 cm/s LVOT VTI:          0.173 m LVOT/AV VTI ratio: 0.47 AI PHT:            460 msec  AORTA Ao Root diam: 2.60 cm Ao Asc diam:  2.60 cm MITRAL VALVE                TRICUSPID VALVE MV Area (PHT): 3.27 cm     TV Peak grad:   22.7 mmHg MV Area VTI:   0.34 cm     TV Mean grad:   16.0 mmHg MV Peak grad:  69.2 mmHg    TV Vmax:        2.38 m/s MV Mean grad:  5.5  mmHg     TV Vmean:       192.0 cm/s MV Vmax:       4.16 m/s     TV VTI:         0.72 msec MV Vmean:      361.0 cm/s MV Decel Time: 232 msec     SHUNTS MV E velocity: 150.00 cm/s  Systemic VTI:  0.17 m MV A velocity: 120.00 cm/s  Systemic Diam: 1.90 cm MV E/A ratio:  1.25 Mihai Croitoru MD Electronically signed by Sanda Klein MD Signature Date/Time: 07/18/2021/3:35:46 PM    Final     Assessment/Plan: Principal Problem:   Septic Arthritis, Infection of prosthetic right knee joint ([place]), MSSA infection Active Problems:  Essential hypertension   Hypothyroidism   Obesity (BMI 30-39.9)   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  Hemoglobin 5.3  Patient refusing blood products at this time as she is a Sales promotion account executive witness. Daughter overridden no blood order on 2/17 and she received 2 units PRBC for Hbg of 4.1.  Post op recs: WB: 50% PWB, knee immobilizer when walking.  Can be removed when in bed or working with physical therapy Dressing: Prevana removed 2/22. Aquacell dressing applied to be left on for additional 2 weeks until suture removal. Abx: continue ancef per infectious disease recommendations, follow-up Intra-Op cultures: MSSA. Aspiration results from clinic aspiration on 07/10/21 growing MSSA. Imaging: PACU xrays DVT prophylaxis: per medicine team, being held at this time given severe anemia. Follow up: 2-3  week after surgery for a wound check with Dr. Zachery Dakins at [facility].  Address: [address] [place], [place], [place] [address]  Office Phone: [phone]   Contact information:   After hours and holidays please check [email or web address] for group call information for [facility]  Jesslynn Kruck A Caden Fukushima 07/19/2021, 12:48 PM

## 2021-07-19 NOTE — Progress Notes (Signed)
Patient refused CPAP.

## 2021-07-19 NOTE — Progress Notes (Signed)
PROGRESS NOTE  Danielle [name]  DOB: [birth year]  PCP: Danielle Carol, MD ZOX:[id number]  DOA: 07/10/2021  LOS: 9 days  [name] Day: 10  Brief narrative: Danielle [name] is a 76 y.o. 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 She presented to the ED on 2/13 for evaluation of failed antibiotic treatment for right knee recurrent septic arthritis.  December 2019, patient underwent right knee arthroplasty by Dr. Mardelle [name]  November 2022, patient was seen in his office for severe acute pain and swelling of the right knee.  Right knee aspiration at that time was concerning for staph infection of her prosthetic knee joint.  She underwent I&D with liner exchange and was treated postoperatively with IV antibiotics.  She did well overall and was transitioned as an outpatient from the cefazolin to suppression with Keflex.  Evidently she ran out of Keflex approximately a week ago.  Also for the past 2 to 3 weeks, patient was getting progressively worsening pain and difficulty with ambulation and hence she was admitted on 2/13.  On 2/15, she had explant right total knee arthroplasty with implantation of articulating antibiotic spacer. Postoperatively, she was monitored in ICU and transferred to Danielle [name] next day only to note that the hemoglobin had dropped from 9.2-4.1.  She was transferred back to ICU.  She is a Jehovah witness and in the past refusing blood transfusion.  While in ICU, she was lethargic.  Her daughter gave consent for blood products and hence she got 2 units of PRBC transfusion.  Subsequently patient woke up and then declined any further transfusion. Her hemoglobin has been running in low but stable range since then.  Subjective: Patient was seen and examined this  morning.  Not in physical distress at this time. Family not at bedside.  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  Patient underwent explant right total knee arthroplasty with implantation of articulating antibiotic spacer by Danielle [name] on 07/12/2021.    Infectious disease was consulted.  Patient transition to IV cefazolin.  PICC line has been placed.  Last day of treatment will be 08/23/21.  OPAT orders placed by ID.  Acute on chronic anemia Jehovah witness refusing blood transfusion -Baseline hemoglobin between 8 and 9.   -Postoperatively, patient had a significant drop in hemoglobin from 9.2-4.1 on 2/16.   -While in ICU, patient was lethargic and confused.  Consent was obtained from daughter for blood transfusion. She was transfused 2 units of PRBC in the ICU.  CT abdomen pelvis did not show any evidence of retroperitoneal bleed. -For the last 3 days, her hemoglobin has been  stable at 5.3. Recent Labs    04/24/21 0229 07/10/21 1404 07/12/21 0343 07/14/21 0407 07/14/21 0640 07/14/21 1439 07/15/21 0621 07/16/21 0434 07/17/21 1659 07/18/21 0244  HGB 7.0* 10.3* 9.2* 4.1* 4.1* 4.9* 5.0* 5.3* 5.3* 5.3*   Uncontrolled type 2  diabetes mellitus -A1c 8.2 on 07/10/2021 -Home meds include Lantus 30 units, sliding scale insulin -Currently on Semglee 40 units daily and sliding scale insulin -Continue Neurontin for peripheral neuropathy Recent Labs  Lab 07/18/21 1546 07/18/21 1934 07/19/21 0818 07/19/21 1153 07/19/21 1644  GLUCAP 115* 111* 76 87 87   CAD/stents -Had a stent 3 times in the past, last in 2017. -Currently not having anginal symptoms. -Seen by cardiology on 2/14. -Continue Lipitor.  Eliquis on hold  PAF -Eliquis on hold because of severe anemia.  Chronic combined systolic and diastolic CHF History of hypertension -Last echo from December 2021 with EF 45 to 28%, grade 2 diastolic dysfunction, mildly elevated pulmonary artery systolic pressure -Home meds include Coreg 12.5 mg twice daily, torsemide 20 mg daily, losartan 25 mg daily and Aldactone 12.5 mg daily. -Currently all BP/CHF meds on hold.  Blood pressure in low normal range.  Euvolemic.  Creatinine improving.  CKD stage IIIa -Creatinine level stable continue losartan and torsemide. Recent Labs    04/19/21 0205 04/21/21 0250 04/24/21 0229 06/22/21 1243 07/10/21 1404 07/12/21 0343 07/13/21 0834 07/14/21 0407 07/15/21 0621 07/16/21 0434  BUN 30* 24* 18 35* 21 19 25* 31* 37* 32*  CREATININE 1.27* 1.23* 1.17* 1.49* 1.22* 1.36* 1.41* 1.59* 1.56* 1.23*   Hypothyroidism -Synthroid  Morbid obesity  -Body mass index is 39.09 kg/m. Patient has been advised to make an attempt to improve diet and exercise patterns to aid in weight loss.  OSA -CPAP nightly  Mobility: PT eval postprocedure Goals of care   Code Status: Full Code    Nutritional status:  Body mass index is 39.09 kg/m.      Diet:  Diet Order             Diet Carb Modified Fluid consistency: Thin; Room service appropriate? Yes  Diet effective now                   DVT prophylaxis:  heparin injection 5,000 Units Start: 07/16/21 1100 SCDs Start:  07/10/21 1254   Antimicrobials: IV Ancef Fluid: None Family Communication: None at bedside  Status is: Inpatient  Continue in-[name] care because: Pending SNF Level of care: Med-Surg   Dispo: The patient is from: Home              Anticipated d/c is to: Pending SNF              Patient currently is not medically stable to d/c.   Difficult to place patient No     Infusions:   sodium chloride 250 mL (07/13/21 0209)    ceFAZolin (ANCEF) IV 2 g (07/19/21 1452)   lactated ringers 10 mL/hr at 07/12/21 1516    Scheduled Meds:  atorvastatin  80 mg Oral QHS   Chlorhexidine Gluconate Cloth  6 each Topical Daily   empagliflozin  10 mg Oral Daily   ferrous sulfate  325 mg Oral BID WC   folic acid  1 mg Oral Daily   heparin injection (subcutaneous)  5,000 Units Subcutaneous Q12H   insulin aspart  0-15 Units Subcutaneous TID WC   insulin aspart  0-5 Units Subcutaneous QHS   insulin glargine-yfgn  40 Units Subcutaneous QHS   levothyroxine  75 mcg Oral Q0600   mouth rinse  15 mL Mouth Rinse BID   multivitamin with minerals  1 tablet Oral Daily   polyethylene glycol  17 g Oral Daily   senna  1 tablet Oral BID   sodium chloride flush  10-40 mL Intracatheter Q12H   thiamine  100 mg Oral Daily    PRN meds: acetaminophen, bisacodyl, diphenhydrAMINE, HYDROmorphone (DILAUDID) injection, lip balm, ondansetron **OR** ondansetron (ZOFRAN) IV, oxyCODONE, sodium chloride flush   Antimicrobials: Anti-infectives (From admission, onward)    Start     Dose/Rate Route Frequency Ordered Stop   07/12/21 1916  tobramycin (NEBCIN) powder  Status:  Discontinued          As needed 07/12/21 1917 07/12/21 2124   07/12/21 1915  vancomycin (VANCOCIN) powder  Status:  Discontinued          As needed 07/12/21 1915 07/12/21 2124   07/12/21 1745  vancomycin (VANCOCIN) IVPB 1000 mg/200 mL premix        1,000 mg 200 mL/hr over 60 Minutes Intravenous  Once 07/12/21 1741 07/12/21 1759   07/12/21 1710   vancomycin (VANCOCIN) 1-5 GM/200ML-% IVPB       Note to Pharmacy: Hoy Morn E: cabinet override      07/12/21 1710 07/12/21 1742   07/10/21 1400  ceFAZolin (ANCEF) IVPB 2g/100 mL premix        2 g 200 mL/hr over 30 Minutes Intravenous Every 8 hours 07/10/21 1258         Objective: Vitals:   07/19/21 0811 07/19/21 1500  BP: (!) 102/40 128/60  Pulse: 74 76  Resp: 17 18  Temp: 98.8 F (37.1 C) 98.8 F (37.1 C)  SpO2: 99% 95%    Intake/Output Summary (Last 24 hours) at 07/19/2021 1650 Last data filed at 07/19/2021 1500 Gross per 24 hour  Intake 240 ml  Output 600 ml  Net -360 ml   Filed Weights   07/16/21 0500 07/17/21 0500 07/19/21 0500  Weight: 98 kg 98.5 kg 103.3 kg   Weight change:  Body mass index is 39.09 kg/m.   Physical Exam: General exam: Pleasant, elderly African-American female.  Not in physical distress Skin: No rashes, lesions or ulcers. HEENT: Atraumatic, normocephalic, no obvious bleeding Lungs: Clear to auscultation bilaterally CVS: Regular rate and rhythm, no murmur GI/Abd soft, nontender, nondistended, bowel sound present CNS: Alert, awake, oriented times 3 Psychiatry: Mood appropriate Extremities: Pedal edema trace bilaterally, right more than left  Data Review: I have personally reviewed the laboratory data and studies available.  F/u labs ordered Unresulted Labs (From admission, onward)     Start     Ordered   07/20/21 0500  Hemoglobin and hematocrit, blood  Once,   R       Question:  Specimen collection method  Answer:  IV Team=IV Team collect   07/18/21 1025   07/20/21 0500  Creatinine, serum  Once,   R       Question:  Specimen collection method  Answer:  IV Team=IV Team collect   07/18/21 1025   07/12/21 0500  CBC with Differential/Platelet  Daily,   R      07/11/21 1504   Unscheduled  Occult blood card to lab, stool  As needed,   R      07/14/21 0802            Signed, Terrilee Croak, MD [facility] 07/19/2021

## 2021-07-19 NOTE — TOC Progression Note (Addendum)
Transition of Care [facility]) - Progression Note    Patient Details  Name: Danielle [name] MRN: [id number] Date of Birth: [birth year]  [facility] CM/SW Contact  Joanne Chars, LCSW Phone Number: 07/19/2021, 11:41 AM  Clinical Narrative:   CSW called [name] with bed offers, she was frustrated that so many facilities are out of network.  Discussed possible facilities in [facility] [place] [facility] that take [place], she does not want to travel that far,  will review [facility] and [place] and get back to CSW.   1530: CSW spoke with [name], she will visit [facility] now.    Expected Discharge Plan: [place] Barriers to Discharge: Continued Medical Work up, SNF Pending bed offer  Expected Discharge Plan and Services Expected Discharge Plan: [place] In-house Referral: Clinical Social Work   Post Acute Care Choice: [place] Living arrangements for the past 2 months: Apartment                                       Social Determinants of Health (SDOH) Interventions    Readmission Risk Interventions No flowsheet data found.

## 2021-07-19 NOTE — Progress Notes (Unsigned)
Name: Danielle [name]  Age/ Sex: 76 y.o., female   MRN/ DOB: [id number], May 05, 1946     PCP: Seward Carol, MD   Reason for Endocrinology Evaluation: Type 2 Diabetes Mellitus  Initial Endocrine Consultative Visit: 05/14/2019    PATIENT IDENTIFIER: Danielle [name] is a 76 y.o. female with a past medical history of T2DM, HTN, Dyslipidemia, CAD, Hx of Breast Ca (S/P chemo and lumpectomy)  . The patient has followed with Endocrinology clinic since 05/14/2019 for consultative assistance with management of her diabetes.  DIABETIC HISTORY:  Ms. Toelle was diagnosed with T2DM > 20 yrs ago. Has been on  Metformin in the past which caused diarrhea, has been on prandial insulin in the past  . Her hemoglobin A1c has ranged from 6.9% in 2019, peaking at 8.0% in 2020.  On her initial visit to our clinic her A1c was 11.2% . She was on Jardiance and Lantus 130 units daily. I reduced her lantus to 80 units, added prandial insulin.   Jardriance initiated  By cardiology 09/2019  SUBJECTIVE:   During the last visit (01/19/2021): A1c 8.8%. We continued jardiance and adjusted MDI regimen.     Today (07/19/2021): Ms. Broder is here for a follow up on diabetes management.  She  has not had a glucose meter in 2 weeks, just received The patient has had hypoglycemic episodes since the last clinic visit. She is symptomatic with these episodes.   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)  Last eye exam: Completed 07/06/2020    Macrovascular complications:  CAD ( S/P MI and stent placement) Denies: PVD, CVA     HISTORY:  Past Medical History:  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[place]) 02/05/2017   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 05/20/2018   Acute kidney failure ([place]) 05/15/2018   Arthritis    "all over" (05/22/2018)   Blood dyscrasia    per pt-has small blood cells-appears as if anemic   Breast cancer, left breast ([place]) 1985   CHF (congestive heart failure) (HCC)    Complication of anesthesia    difficult to awaken from per pt   Coronary artery disease    Dyspnea    Essential hypertension    Heart disease    Heart murmur    History of gout    Hypothyroidism    Obesity (BMI 30-39.9) 11/11/2017   OSA (obstructive sleep apnea) 02/18/2017    severe obstructive sleep apnea with an AHI of 75.6/h and mild central sleep apnea with a CAI of 7.7/h.  Oxygen saturations dropped as low as 82%.   He is on CPAP at 9 cm H2O.   OSA on CPAP    Personal history of chemotherapy    Personal history of radiation therapy    Presence of permanent cardiac pacemaker  Primary localized osteoarthritis of right knee 05/13/2018   Refusal of blood transfusions as patient is Jehovah's Witness    Type II diabetes mellitus ([place])    Past Surgical History:  Past Surgical History:  Procedure Laterality Date   APPLICATION OF WOUND VAC Right 04/14/2021   Procedure: APPLICATION OF WOUND VAC;  Surgeon: Willaim Sheng, MD;  Location: [place];  Service: Orthopedics;  Laterality: Right;   BREAST BIOPSY Left 1985   BREAST LUMPECTOMY Left 1985   BREAST LUMPECTOMY WITH NEEDLE LOCALIZATION AND AXILLARY LYMPH NODE DISSECTION  1985   CATARACT EXTRACTION W/ INTRAOCULAR LENS  IMPLANT, BILATERAL Bilateral    CORONARY ANGIOPLASTY WITH STENT PLACEMENT  2010, 2012,2017    2 done 2010 and  1 replaced 2012 and 2 replaced in 2017   [place]     EXCISIONAL TOTAL KNEE ARTHROPLASTY WITH ANTIBIOTIC SPACERS Right 07/12/2021   Procedure: EXCISIONAL TOTAL KNEE ARTHROPLASTY WITH ANTIBIOTIC SPACERS;  Surgeon: Willaim Sheng, MD;  Location: [place];  Service: Orthopedics;  Laterality: Right;   I & D KNEE WITH POLY EXCHANGE Right 04/14/2021   Procedure: IRRIGATION AND DEBRIDEMENT KNEE WITH POLY EXCHANGE;  Surgeon: Willaim Sheng, MD;  Location: [place];  Service: Orthopedics;  Laterality: Right;   INSERT / REPLACE / REMOVE PACEMAKER  07/19/2014   JOINT REPLACEMENT     REPLACEMENT TOTAL KNEE Left 09/2012   THYROIDECTOMY, PARTIAL  1978   TONSILLECTOMY     TOTAL KNEE ARTHROPLASTY Right 05/13/2018   Procedure: TOTAL KNEE ARTHROPLASTY;  Surgeon: Marchia Bond, MD;  Location: [facility];  Service: Orthopedics;  Laterality: Right;   TUBAL LIGATION     Social History:  reports that she quit smoking about 40 years ago. Her smoking use included cigarettes. She has a 22.00 pack-year smoking history. She has never used smokeless tobacco. She reports current alcohol use. She reports that she does not currently use drugs. Family History:  Family History  Problem Relation Age of Onset   Multiple myeloma Mother    Heart disease Father    Other Sister    Heart disease Brother    Diabetes Sister    Other Sister      HOME MEDICATIONS: Allergies as of 07/19/2021       Reactions   Other Other (See Comments)   Blood Product Refusal (Jehovah's witness)   Tape Other (See Comments)   Leaves blisters and marks on the skin   Sulfa Antibiotics Rash   Uloric [febuxostat] Rash        Medication List      Notice   This visit is during an admission. Changes to the med list made in this visit will be reflected in the After Visit Summary of the admission.      OBJECTIVE:   Vital Signs: There were no vitals taken for this visit.  Wt Readings from Last 3 Encounters:   07/19/21 227 lb 11.8 oz (103.3 kg)  07/03/21 211 lb (95.7 kg)  06/19/21 213 lb (96.6 kg)     EXAM: General: Pt appears well and is in NAD Bilateral cerumen impaction noted  Heart: RRR , + systolic murmur  Lungs: CTA  Extremities:  BL LE: 1+  pretibial edema   Mental Status: Judgment, insight: intact Orientation: oriented to time, place, and person Mood and affect: no depression, anxiety, or agitation    DM Foot Exam 05/04/2020 The skin of the feet is without sores or ulcerations. The pedal pulses unable to  palpate due to edema  The sensation is decreased to a screening 5.07, 10 gram monofilament bilaterally at the toes    DATA REVIEWED: Results for MINA, BABULA (MRN [id number]) as of 05/04/2020 08:28  Ref. Range 04/25/2020 12:28  Sodium Latest Ref Range: 135 - 145 mmol/L 141  Potassium Latest Ref Range: 3.5 - 5.1 mmol/L 4.2  Chloride Latest Ref Range: 98 - 111 mmol/L 105  CO2 Latest Ref Range: 22 - 32 mmol/L 28  Glucose Latest Ref Range: 70 - 99 mg/dL 118 (H)  Mean Plasma Glucose Latest Units: mg/dL 188.64  BUN Latest Ref Range: 8 - 23 mg/dL 29 (H)  Creatinine Latest Ref Range: 0.44 - 1.00 mg/dL 1.45 (H)  Calcium Latest Ref Range: 8.9 - 10.3 mg/dL 9.5  Anion gap Latest Ref Range: 5 - 15  8  GFR, Estimated Latest Ref Range: >60 mL/min 38 (L)      Lab Results  Component Value Date   HGBA1C 8.2 (H) 07/10/2021   HGBA1C 7.5 (H) 04/13/2021   HGBA1C 8.8 (A) 01/19/2021   Lab Results  Component Value Date   LDLCALC 65 10/20/2019   CREATININE 1.23 (H) 07/16/2021     Lab Results  Component Value Date   CHOL 150 10/20/2019   HDL 44 10/20/2019   LDLCALC 65 10/20/2019   TRIG 206 (H) 10/20/2019   CHOLHDL 3.4 10/20/2019         ASSESSMENT / PLAN / RECOMMENDATIONS:   1) Type 2 Diabetes Mellitus, poorly with Neuropathic, CKD III and Macrovascular  complications - Most recent A1c of 8.8%. Goal A1c < 7.5 %.    -Unfortunately her glycemic control has worsened, this  is due to multiple factors to include stopping exercise due to physical limitations, also her prandial insulin cost has increased and she is currently on patient assistance program, but she was without prandial insulin for almost a month  -Her in office BG was 77 mg/DL this is after taking 28 units of Humalog with breakfast -I am going to reduce her insulin as below -I have attempted to prescribe Dexcom in the past, I explained to the patient that we use third-party pharmacy for this and she was provided with contact information for [facility] and in case it is needed -She will continue to use correction scale as below   MEDICATIONS: Decrease Tresiba to 75 units ONCE daily  She is on Jardiance 10 mg daily per cardiology Decrease Humalog to 25 units with each meal CF: Humalog ( BG - 150/20)   EDUCATION / INSTRUCTIONS: BG monitoring instructions: Patient is instructed to check her blood sugars 4 times a day, before each meal and bedtime. Call [place] [facility] if: BG persistently < 70 I reviewed the Rule of 15 for the treatment of hypoglycemia in detail with the patient. Literature supplied.       F/U in 4 months   Signed electronically by: Mack Guise, MD  [facility]  [facility] [address], [place] [address] Phone: [phone] FAX: [phone]   CC: Seward Carol, [place] [employer] [address] [place] [address] Phone: [phone]  Fax: [phone]  Return to Endocrinology clinic as below: Future Appointments  Date Time Provider Justin  07/19/2021  2:00 PM Shaquanda Graves, Melanie Crazier, MD [facility] None  07/25/2021 10:30 AM Laurice Record, MD [facility]  09/14/2021 11:00 AM [facility] DEVICE REMOTES [facility]  09/20/2021 10:40 AM Bensimhon, Shaune Pascal, MD [facility] None  12/14/2021 11:00  AM [facility] DEVICE REMOTES [facility]  03/15/2022 11:00 AM [facility]  DEVICE REMOTES [facility]  06/14/2022 11:00 AM [facility] DEVICE REMOTES [facility]

## 2021-07-19 NOTE — Plan of Care (Signed)
°  Problem: Health Behavior/Discharge Planning: Goal: Ability to manage health-related needs will improve Outcome: Progressing   Problem: Clinical Measurements: Goal: Respiratory complications will improve Outcome: Progressing Goal: Cardiovascular complication will be avoided Outcome: Progressing

## 2021-07-19 NOTE — Progress Notes (Signed)
Physical Therapy Treatment Patient Details Name: Danielle [name] MRN: [id number] DOB: 1945/08/11 Today's Date: 07/19/2021   History of Present Illness Patient is a 76 y/o female admitted due to R TKA infection.  Initially had TKA 04/2018 then had pain/swelling 03/2021 with I&D and liner exchange and IV antibotics.  Admitted with worse pain and difficulty ambulating with fevers.  S/p R explant TKA w/implantation of articulating antibiotic spacer on 07/12/2021.  Post op had severe anemia with transfer to ICU due to [facility] witness and refusing blood products.  Did finally have transfusion 2/17 with hemoglobin at 5.0 after transfusion.  PMH positive for HFpEF, CAD w/ multiple prior PCIs, SSS s/p PPM, Afib on AC, OSA on CPAP, CKD, HTN, DM.    PT Comments    Pt received supine and agreeable to session. Pt able to come to long sitting in bed and balance for >3 mins with supervision but unable to mobilize LE to/off EOB, PTA attempting to assist RLE to EOB, pt refusing stating "No". Pt returned to supine with good tolerance for P/AAROM of RLE for increasing strength and ROM. Notable increased edema in LUE, arm elevated at EOS, RN notified. Pt continues to benefit from skilled PT services to progress toward functional mobility goals.    Recommendations for follow up therapy are one component of a multi-disciplinary discharge planning process, led by the attending physician.  Recommendations may be updated based on patient status, additional functional criteria and insurance authorization.  Follow Up Recommendations  Skilled nursing-short term rehab (<3 hours/day)     Assistance Recommended at Discharge Frequent or constant Supervision/Assistance  Patient can return home with the following Two people to help with walking and/or transfers;Two people to help with bathing/dressing/bathroom;Assistance with feeding   Equipment Recommendations  None recommended by PT    Recommendations for Other Services        Precautions / Restrictions Precautions Precautions: Fall;Knee Required Braces or Orthoses: Knee Immobilizer - Right Knee Immobilizer - Right: On when out of bed or walking Restrictions Weight Bearing Restrictions: Yes RLE Weight Bearing: Partial weight bearing RLE Partial Weight Bearing Percentage or Pounds: 50     Mobility  Bed Mobility Overal bed mobility: Needs Assistance Bed Mobility: Supine to Sit     Supine to sit: Supervision     General bed mobility comments: pt able to come to long sitting in bed and maintain balance for >3 mins    Transfers                        Ambulation/Gait                   Stairs             Wheelchair Mobility    Modified Rankin (Stroke Patients Only)       Balance                                            Cognition Arousal/Alertness: Lethargic Behavior During Therapy: Anxious Overall Cognitive Status: Impaired/Different from baseline Area of Impairment: Attention, Following commands, Safety/judgement                   Current Attention Level: Focused   Following Commands: Follows one step commands with increased time, Follows one step commands inconsistently Safety/Judgement: Decreased awareness of safety     General  Comments: irritated at times by efforts to have her move and refusing to move despite encouragement and multiple trials        Exercises General Exercises - Lower Extremity Ankle Circles/Pumps: AROM, Both, 20 reps, Supine Quad Sets: AROM, Right, 5 reps, Supine Heel Slides: PROM, AAROM, Right, 20 reps, Supine Hip ABduction/ADduction: PROM, AAROM, Right, 10 reps, Supine Straight Leg Raises: PROM, AAROM, Right, 10 reps, Supine Other Exercises Other Exercises: PROM long axis RLE IR/ER x 20    General Comments        Pertinent Vitals/Pain Pain Assessment Pain Assessment: Faces Faces Pain Scale: Hurts whole lot Pain Location: R LE and R  shoulder with movement Pain Descriptors / Indicators: Grimacing, Guarding, Moaning Pain Intervention(s): Limited activity within patient's tolerance, Monitored during session, Repositioned    Home Living                          Prior Function            PT Goals (current goals can now be found in the care plan section) Acute Rehab PT Goals Patient Stated Goal: to go to rehab PT Goal Formulation: With patient/family Time For Goal Achievement: 07/29/21    Frequency    Min 3X/week      PT Plan      Co-evaluation              AM-PAC PT "6 Clicks" Mobility   Outcome Measure  Help needed turning from your back to your side while in a flat bed without using bedrails?: Total Help needed moving from lying on your back to sitting on the side of a flat bed without using bedrails?: Total Help needed moving to and from a bed to a chair (including a wheelchair)?: Total Help needed standing up from a chair using your arms (e.g., wheelchair or bedside chair)?: Total Help needed to walk in hospital room?: Total Help needed climbing 3-5 steps with a railing? : Total 6 Click Score: 6    End of Session   Activity Tolerance: Patient limited by fatigue;Patient limited by pain;Patient limited by lethargy Patient left: in bed;with call bell/phone within reach;with bed alarm set Nurse Communication: Mobility status PT Visit Diagnosis: Difficulty in walking, not elsewhere classified (R26.2);Pain;Muscle weakness (generalized) (M62.81) Pain - Right/Left: Right Pain - part of body: Knee     Time: 0539-7673 PT Time Calculation (min) (ACUTE ONLY): 26 min  Charges:  $Therapeutic Exercise: 8-22 mins $Therapeutic Activity: 8-22 mins                     Rechy Bost R. PTA Acute Rehabilitation Services Office: H. [facility] Colon 07/19/2021, 2:58 PM

## 2021-07-20 DIAGNOSIS — T8453XA Infection and inflammatory reaction due to internal right knee prosthesis, initial encounter: Secondary | ICD-10-CM | POA: Diagnosis not present

## 2021-07-20 LAB — GLUCOSE, CAPILLARY
Glucose-Capillary: 106 mg/dL — ABNORMAL HIGH (ref 70–99)
Glucose-Capillary: 93 mg/dL (ref 70–99)
Glucose-Capillary: 144 mg/dL — ABNORMAL HIGH (ref 70–99)
Glucose-Capillary: 98 mg/dL (ref 70–99)

## 2021-07-20 LAB — HEMOGLOBIN AND HEMATOCRIT, BLOOD
HCT: 18.3 % — ABNORMAL LOW (ref 36.0–46.0)
Hemoglobin: 5.4 g/dL — CL (ref 12.0–15.0)

## 2021-07-20 LAB — CREATININE, SERUM
Creatinine, Ser: 1.09 mg/dL — ABNORMAL HIGH (ref 0.44–1.00)
GFR, Estimated: 53 mL/min — ABNORMAL LOW (ref >60)

## 2021-07-20 NOTE — Progress Notes (Signed)
Pharmacy Antibiotic Note  Danielle [name] is a 76 y.o. female admitted on 07/10/2021 with  prosthetic joint infection .  Pharmacy  consulted on 07/10/21 for cefazolin dosing for right knee prosthetic joint infection. Started on Cefazolin 2 g IV q8h.   S/p explant and abx spacer 07/12/21 with orthopedics.   R knee wound cultures: MSSA infection. Blood cultures negative CKD stage IIIa, SCr has improved to 1.09.  ID recommended continue antibiotic therapy with Cefazolin for  6 weeks from OR date, end date thru 08/23/21.    Plan: Continue Cefazolin 2 grams IV q8h  End date 08/23/21 Monitor clinical status, renal function and culture results.  OPAT orders entered by ID pharmacist.   Height: 5\' 4"  (162.6 cm) Weight: 103.3 kg (227 lb 11.8 oz) IBW/kg (Calculated) : 54.7  Temp (24hrs), Avg:98.7 F (37.1 C), Min:98.5 F (36.9 C), Max:98.9 F (37.2 C)  Recent Labs  Lab 07/14/21 0407 07/14/21 1439 07/15/21 0621 07/16/21 0434 07/17/21 1659 07/20/21 0359  WBC 9.2 9.4 9.5 11.4* 10.1  --   CREATININE 1.59*  --  1.56* 1.23*  --  1.09*     Estimated Creatinine Clearance: 53 mL/min (A) (by C-G formula based on SCr of 1.09 mg/dL (H)).    Allergies  Allergen Reactions   Other Other (See Comments)    Blood Product Refusal (Jehovah's witness)     Tape Other (See Comments)    Leaves blisters and marks on the skin   Sulfa Antibiotics Rash   Uloric [Febuxostat] Rash    Antimicrobials this admission: 2/13 cefazolin >>   Dose adjustments this admission: No adjustments at this time  Microbiology results: 2/13 R knee asp cx (outpt ortho clinic): MSSA 2/14 MRSA PCR: negative 2/15 R knee cx x 3: MSSA  2/16 MRSA PCR: negative 2/17 BCx x2:  negative    Thank you for allowing pharmacy to be a part of this patients care.  Nicole Cella, RPh Clinical Pharmacist [phone]  07/20/2021 1:11 PM  Please check AMION for all [facility] phone numbers After 10:00 PM, call Carl [phone]

## 2021-07-20 NOTE — Progress Notes (Signed)
Patient's left arm has been swollen but is more swollen this morning from last night.   Contacted on-call MD.  Also updated RN who is taking over for this patient at 0400.  Patient is alert and oriented x 2.  Disoriented to time. The same as last night.  No pain in the left arm but it is very puffy and swollen.

## 2021-07-20 NOTE — Progress Notes (Signed)
This chaplain is present for F/U spiritual care. The Pt. is awake, attempting to eat yogurt and banana. The chaplain understands the Pt. knee pain is at a "ten".  The chaplain presence matured into storytelling and laughter about Pt. family. The Pt. daughter-Keisha dropped in at the end of the visit and chaplain agreed to visit at another time.  Chaplain Sallyanne Kuster [phone]

## 2021-07-20 NOTE — Progress Notes (Signed)
Subjective: 8 Days Post-Op s/p Procedure(s): EXCISIONAL TOTAL KNEE ARTHROPLASTY WITH ANTIBIOTIC SPACERS Doing well on the floor. Worked well with PT yesterday sitting up to edge of bed with good balance. Went to the bathroom. Left arm more swollen today, states it feels tight but not much pain. Had lymph nodes removed from left side.. Encouraged her to keep arm elevated on pillows above heart level.   Objective:  PE: VITALS:   Vitals:   07/19/21 1500 07/19/21 2100 07/20/21 0435 07/20/21 0725  BP: 128/60 (!) 128/48 (!) 113/39 (!) 122/98  Pulse: 76 76 77 76  Resp: 18 (!) 23 20   Temp: 98.8 F (37.1 C) 98.9 F (37.2 C) 98.5 F (36.9 C) 98.6 F (37 C)  TempSrc: Oral Oral Oral Oral  SpO2: 95% 95% 99% 100%  Weight:      Height:       Neurovascular intact Sensation intact distally Dorsiflexion/Plantar flexion intact Incision: aquacell dressing c/d/I. Tolerated knee ROM 0-60. Compartment soft Swelling of the left ankle Endorses distal sensation  LABS  Results for orders placed or performed during the hospital encounter of 07/10/21 (from the past 24 hour(s))  Glucose, capillary     Status: None   Collection Time: 07/19/21  8:18 AM  Result Value Ref Range   Glucose-Capillary 76 70 - 99 mg/dL  Glucose, capillary     Status: None   Collection Time: 07/19/21 11:53 AM  Result Value Ref Range   Glucose-Capillary 87 70 - 99 mg/dL  Glucose, capillary     Status: None   Collection Time: 07/19/21  4:44 PM  Result Value Ref Range   Glucose-Capillary 87 70 - 99 mg/dL  Glucose, capillary     Status: None   Collection Time: 07/19/21  8:58 PM  Result Value Ref Range   Glucose-Capillary 99 70 - 99 mg/dL  Hemoglobin and hematocrit, blood     Status: Abnormal   Collection Time: 07/20/21  3:59 AM  Result Value Ref Range   Hemoglobin 5.4 (LL) 12.0 - 15.0 g/dL   HCT 18.3 (L) 36.0 - 46.0 %  Creatinine, serum     Status: Abnormal   Collection Time: 07/20/21  3:59 AM  Result Value  Ref Range   Creatinine, Ser 1.09 (H) 0.44 - 1.00 mg/dL   GFR, Estimated 53 (L) >60 mL/min  Glucose, capillary     Status: Abnormal   Collection Time: 07/20/21  7:24 AM  Result Value Ref Range   Glucose-Capillary 106 (H) 70 - 99 mg/dL    ECHOCARDIOGRAM COMPLETE  Result Date: 07/18/2021    ECHOCARDIOGRAM REPORT   Patient Name:   Danielle [name] Date of Exam: 07/18/2021 Medical Rec #:  [id number]     Height:       64.0 in Accession #:    [id number]    Weight:       217.2 lb Date of Birth:  [birth year]     BSA:          2.026 m Patient Age:    76 years      BP:           101/42 mmHg Patient Gender: F             HR:           80 bpm. Exam Location:  Inpatient Procedure: 2D Echo, Cardiac Doppler and Color Doppler Indications:    I38 ENDOCARDITIS  History:        Patient has prior  history of Echocardiogram examinations, most                 recent 04/29/2020. CHF, Signs/Symptoms:Dyspnea; Risk                 Factors:Hypertension and Diabetes. CAD / CHEMO.  Sonographer:    Beryle Beams Referring Phys: [phone] LINDSAY NICOLE [place]  1. Left ventricular ejection fraction, by estimation, is 40 to 45%. The left ventricle has mildly decreased function. The left ventricle demonstrates regional wall motion abnormalities (see scoring diagram/findings for description). Left ventricular diastolic parameters are consistent with Grade II diastolic dysfunction (pseudonormalization). Elevated left atrial pressure.  2. Right ventricular systolic function is normal. The right ventricular size is normal.  3. Left atrial size was severely dilated.  4. The mitral valve is grossly normal. Mild to moderate mitral valve regurgitation. The mean mitral valve gradient is 5.5 mmHg with average heart rate of 82 bpm. Moderate mitral annular calcification.  5. Tricuspid valve regurgitation is moderate.  6. The aortic valve is tricuspid. There is mild calcification of the aortic valve. There is mild thickening of the aortic valve. Aortic  valve regurgitation is mild. Aortic valve sclerosis/calcification is present, without any evidence of aortic stenosis.  7. The inferior vena cava is dilated in size with <50% respiratory variability, suggesting right atrial pressure of 15 mmHg. Comparison(s): Prior images reviewed side by side. The left ventricular function is worsened. The left ventricular wall motion abnormality is worse. The distribution of the wall motion abnormality is the same, but the reduction in contractility is worse. FINDINGS  Left Ventricle: Left ventricular ejection fraction, by estimation, is 40 to 45%. The left ventricle has mildly decreased function. The left ventricle demonstrates regional wall motion abnormalities. The left ventricular internal cavity size was normal in size. There is borderline concentric left ventricular hypertrophy. Left ventricular diastolic parameters are consistent with Grade II diastolic dysfunction (pseudonormalization). Elevated left atrial pressure.  LV Wall Scoring: The apical septal segment, apical inferior segment, and apex are akinetic. The apical anterior segment is hypokinetic. Right Ventricle: The right ventricular size is normal. Right vetricular wall thickness was not well visualized. Right ventricular systolic function is normal. Left Atrium: Left atrial size was severely dilated. Right Atrium: Right atrial size was normal in size. Pericardium: There is no evidence of pericardial effusion. Mitral Valve: The mitral valve is grossly normal. There is mild thickening of the mitral valve leaflet(s). Moderate mitral annular calcification. Mild to moderate mitral valve regurgitation, with centrally-directed jet. MV peak gradient, 69.2 mmHg. The mean mitral valve gradient is 5.5 mmHg with average heart rate of 82 bpm. Tricuspid Valve: The tricuspid valve is normal in structure. Tricuspid valve regurgitation is moderate. Aortic Valve: The aortic valve is tricuspid. There is mild calcification of the  aortic valve. There is mild thickening of the aortic valve. Aortic valve regurgitation is mild. Aortic regurgitation PHT measures 460 msec. Aortic valve sclerosis/calcification is present, without any evidence of aortic stenosis. Aortic valve mean gradient measures 8.0 mmHg. Aortic valve peak gradient measures 12.1 mmHg. Aortic valve area, by VTI measures 1.32 cm. Pulmonic Valve: The pulmonic valve was grossly normal. Pulmonic valve regurgitation is not visualized. Aorta: The aortic root and ascending aorta are structurally normal, with no evidence of dilitation. Venous: The inferior vena cava is dilated in size with less than 50% respiratory variability, suggesting right atrial pressure of 15 mmHg. IAS/Shunts: No atrial level shunt detected by color flow Doppler.  LEFT VENTRICLE PLAX 2D LVIDd:  5.10 cm     Diastology LVIDs:         2.80 cm     LV e' medial:    4.05 cm/s LV PW:         1.30 cm     LV E/e' medial:  37.0 LV IVS:        1.10 cm     LV e' lateral:   5.03 cm/s LVOT diam:     1.90 cm     LV E/e' lateral: 29.8 LV SV:         49 LV SV Index:   24 LVOT Area:     2.84 cm  LV Volumes (MOD) LV vol d, MOD A2C: 78.4 ml LV vol d, MOD A4C: 89.4 ml LV vol s, MOD A2C: 26.9 ml LV vol s, MOD A4C: 38.5 ml LV SV MOD A2C:     51.5 ml LV SV MOD A4C:     89.4 ml LV SV MOD BP:      52.0 ml RIGHT VENTRICLE RV S prime:     12.90 cm/s TAPSE (M-mode): 2.0 cm LEFT ATRIUM             Index LA diam:        5.30 cm 2.62 cm/m LA Vol (A2C):   89.2 ml 44.03 ml/m LA Vol (A4C):   88.9 ml 43.88 ml/m LA Biplane Vol: 92.3 ml 45.56 ml/m  AORTIC VALVE                     PULMONIC VALVE AV Area (Vmax):    1.34 cm      PV Vmax:       0.60 m/s AV Area (Vmean):   1.21 cm      PV Vmean:      41.600 cm/s AV Area (VTI):     1.32 cm      PV VTI:        0.143 m AV Vmax:           174.00 cm/s   PV Peak grad:  1.4 mmHg AV Vmean:          132.000 cm/s  PV Mean grad:  1.0 mmHg AV VTI:            0.371 m AV Peak Grad:      12.1 mmHg AV Mean  Grad:      8.0 mmHg LVOT Vmax:         82.50 cm/s LVOT Vmean:        56.300 cm/s LVOT VTI:          0.173 m LVOT/AV VTI ratio: 0.47 AI PHT:            460 msec  AORTA Ao Root diam: 2.60 cm Ao Asc diam:  2.60 cm MITRAL VALVE                TRICUSPID VALVE MV Area (PHT): 3.27 cm     TV Peak grad:   22.7 mmHg MV Area VTI:   0.34 cm     TV Mean grad:   16.0 mmHg MV Peak grad:  69.2 mmHg    TV Vmax:        2.38 m/s MV Mean grad:  5.5 mmHg     TV Vmean:       192.0 cm/s MV Vmax:       4.16 m/s     TV VTI:  0.72 msec MV Vmean:      361.0 cm/s MV Decel Time: 232 msec     SHUNTS MV E velocity: 150.00 cm/s  Systemic VTI:  0.17 m MV A velocity: 120.00 cm/s  Systemic Diam: 1.90 cm MV E/A ratio:  1.25 Mihai Croitoru MD Electronically signed by Sanda Klein MD Signature Date/Time: 07/18/2021/3:35:46 PM    Final     Assessment/Plan: Principal Problem:   Septic Arthritis, Infection of prosthetic right knee joint ([place]), MSSA infection Active Problems:   Essential hypertension   Hypothyroidism   Obesity (BMI 30-39.9)   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  Hemoglobin 5.4, HCT 18.3  Patient refusing blood products at this time as she is a Sales promotion account executive witness. Daughter overridden no blood order on 2/17 and she received 2 units PRBC for Hbg of 4.1.  Post op recs: WB: 50% PWB, knee immobilizer when walking.  Can be removed  when in bed or working with physical therapy Dressing: Prevana removed 2/22. Aquacell dressing applied to be left on for additional 2 weeks until suture removal. Abx: continue ancef per infectious disease recommendations, follow-up Intra-Op cultures: MSSA. Aspiration results from clinic aspiration on 07/10/21 growing MSSA. Imaging: PACU xrays DVT prophylaxis: per medicine team, eliquis being held at this time given severe anemia. Getting subQ heparin prophylaxis dosing Follow up: 2-3 week after surgery for a wound check with Dr. Zachery Dakins at [facility].  Address: [address] [place], [place], [place] [address]  Office Phone: [phone]   Contact information:   After hours and holidays please check [email or web address] for group call information for [facility]  Stephanie Littman A Malcolm 07/20/2021, 8:05 AM

## 2021-07-20 NOTE — Progress Notes (Signed)
PROGRESS NOTE  Danielle [name]  DOB: Apr 28, 1946  PCP: Danielle Carol, MD ZOX:[id number]  DOA: 07/10/2021  LOS: 36 days  Hospital Day: 11  Brief narrative: Danielle [name] is a 76 y.o. 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 She presented to the ED on 2/13 for evaluation of failed antibiotic treatment for right knee recurrent septic arthritis.  December 2019, patient underwent right knee arthroplasty by Dr. Mardelle [name]  November 2022, patient was seen in his office for severe acute pain and swelling of the right knee.  Right knee aspiration at that time was concerning for staph infection of her prosthetic knee joint.  She underwent I&D with liner exchange and was treated postoperatively with IV antibiotics.  She did well overall and was transitioned as an outpatient from the cefazolin to suppression with Keflex.  Evidently she ran out of Keflex approximately a week ago.  Also for the past 2 to 3 weeks, patient was getting progressively worsening pain and difficulty with ambulation and hence she was admitted on 2/13.  On 2/15, she had explant right total knee arthroplasty with implantation of articulating antibiotic spacer. Postoperatively, she was monitored in ICU and transferred to [facility] next day only to note that the hemoglobin had dropped from 9.2-4.1.  She was transferred back to ICU.  She is a Jehovah witness and in the past refusing blood transfusion.  While in ICU, she was lethargic.  Her daughter gave consent for blood products and hence she got 2 units of PRBC transfusion.  Subsequently patient woke up and then declined any further transfusion. Her hemoglobin has been running in low but stable range since then.  Subjective: Patient was seen and examined this  morning.   Propped up in bed.  Not in distress.  No new symptoms.  Not in physical distress at this time. Discussed with patient's daughter [name] later this morning. 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  Patient underwent explant right total knee arthroplasty with implantation of articulating antibiotic spacer by Dr. Zachery [name] on 07/12/2021.    -Infectious disease was consulted.  Patient was transitioned to IV cefazolin.  PICC line has been placed.  Last day of treatment will be 08/23/21.  OPAT orders placed by ID.  Acute on chronic anemia Jehovah witness refusing blood transfusion -Baseline hemoglobin between 8 and 9.   -Postoperatively, patient had a significant drop in hemoglobin from 9.2 to 4.1 on 2/16.   -While in ICU, patient was lethargic and confused.  Consent was obtained from daughter for blood transfusion. She was transfused 2  units of PRBC in the ICU.  CT abdomen pelvis did not show any evidence of retroperitoneal bleed. -For the last 5 days, her hemoglobin has been stable at 5.3.-5.4 Recent Labs    07/10/21 1404 07/12/21 0343 07/14/21 0407 07/14/21 0640 07/14/21 1439 07/15/21 0621  07/16/21 0434 07/17/21 1659 07/18/21 0244 07/20/21 0359  HGB 10.3* 9.2* 4.1* 4.1* 4.9* 5.0* 5.3* 5.3* 5.3* 5.4*    Uncontrolled type 2 diabetes mellitus -A1c 8.2 on 07/10/2021 -Home meds include Lantus 30 units, sliding scale insulin -Currently on Semglee 40 units daily and sliding scale insulin -Continue Neurontin for peripheral neuropathy Recent Labs  Lab 07/19/21 1153 07/19/21 1644 07/19/21 2058 07/20/21 0724 07/20/21 1109  GLUCAP 87 87 99 106* 93    CAD/stents -Had a stent 3 times in the past, last in 2017. -Currently not having anginal symptoms. -Seen by cardiology on 2/14. -Continue Lipitor.  Eliquis on hold  PAF -Eliquis on hold because of severe anemia.  Chronic combined systolic and diastolic CHF History of hypertension -Last echo from December 2021 with EF 45 to 67%, grade 2 diastolic dysfunction, mildly elevated pulmonary artery systolic pressure -Home meds include Coreg 12.5 mg twice daily, torsemide 20 mg daily, losartan 25 mg daily and Aldactone 12.5 mg daily. -Currently all BP/CHF meds on hold.  Blood pressure in low normal range.  Euvolemic.  Creatinine improving.  CKD stage IIIa -Creatinine level stable -Continue losartan and torsemide. Recent Labs    04/19/21 0205 04/21/21 0250 04/24/21 0229 06/22/21 1243 07/10/21 1404 07/12/21 0343 07/13/21 0834 07/14/21 0407 07/15/21 0621 07/16/21 0434 07/20/21 0359  BUN 30* 24* 18 35* 21 19 25* 31* 37* 32*  --   CREATININE 1.27* 1.23* 1.17* 1.49* 1.22* 1.36* 1.41* 1.59* 1.56* 1.23* 1.09*    Hypothyroidism -Synthroid  Morbid obesity  -Body mass index is 39.09 kg/m. Patient has been advised to make an attempt to improve diet and exercise patterns to aid in weight loss.  OSA -CPAP nightly  History of left breast cancer Left upper extremity lymphedema -Apply lymphedema compression sleeve.  Mobility: PT eval postprocedure Goals of care   Code Status: Full Code    Nutritional status:  Body  mass index is 39.09 kg/m.      Diet:  Diet Order             Diet Carb Modified Fluid consistency: Thin; Room service appropriate? Yes  Diet effective now                   DVT prophylaxis:  heparin injection 5,000 Units Start: 07/16/21 1100 SCDs Start: 07/10/21 1254   Antimicrobials: IV Ancef Fluid: None Family Communication: None at bedside  Status is: Inpatient  Continue in-hospital care because: Pending SNF Level of care: Med-Surg   Dispo: The patient is from: Home              Anticipated d/c is to: Pending SNF              Patient currently is not medically stable to d/c.   Difficult to place patient No     Infusions:   sodium chloride 250 mL (07/13/21 0209)    ceFAZolin (ANCEF) IV 2 g (07/20/21 1245)   lactated ringers 10 mL/hr at 07/12/21 1516    Scheduled Meds:  atorvastatin  80 mg Oral QHS   Chlorhexidine Gluconate Cloth  6 each Topical Daily   empagliflozin  10 mg Oral Daily   ferrous sulfate  325 mg Oral BID WC  folic acid  1 mg Oral Daily   heparin injection (subcutaneous)  5,000 Units Subcutaneous Q12H   insulin aspart  0-15 Units Subcutaneous TID WC   insulin aspart  0-5 Units Subcutaneous QHS   insulin glargine-yfgn  40 Units Subcutaneous QHS   levothyroxine  75 mcg Oral Q0600   mouth rinse  15 mL Mouth Rinse BID   multivitamin with minerals  1 tablet Oral Daily   polyethylene glycol  17 g Oral Daily   senna  1 tablet Oral BID   sodium chloride flush  10-40 mL Intracatheter Q12H   thiamine  100 mg Oral Daily    PRN meds: acetaminophen, bisacodyl, diphenhydrAMINE, HYDROmorphone (DILAUDID) injection, lip balm, ondansetron **OR** ondansetron (ZOFRAN) IV, oxyCODONE, sodium chloride flush   Antimicrobials: Anti-infectives (From admission, onward)    Start     Dose/Rate Route Frequency Ordered Stop   07/12/21 1916  tobramycin (NEBCIN) powder  Status:  Discontinued          As needed 07/12/21 1917 07/12/21 2124   07/12/21 1915   vancomycin (VANCOCIN) powder  Status:  Discontinued          As needed 07/12/21 1915 07/12/21 2124   07/12/21 1745  vancomycin (VANCOCIN) IVPB 1000 mg/200 mL premix        1,000 mg 200 mL/hr over 60 Minutes Intravenous  Once 07/12/21 1741 07/12/21 1759   07/12/21 1710  vancomycin (VANCOCIN) 1-5 GM/200ML-% IVPB       Note to Pharmacy: Hoy Morn E: cabinet override      07/12/21 1710 07/12/21 1742   07/10/21 1400  ceFAZolin (ANCEF) IVPB 2g/100 mL premix        2 g 200 mL/hr over 30 Minutes Intravenous Every 8 hours 07/10/21 1258         Objective: Vitals:   07/20/21 0435 07/20/21 0725  BP: (!) 113/39 (!) 122/98  Pulse: 77 76  Resp: 20   Temp: 98.5 F (36.9 C) 98.6 F (37 C)  SpO2: 99% 100%    Intake/Output Summary (Last 24 hours) at 07/20/2021 1243 Last data filed at 07/20/2021 0234 Gross per 24 hour  Intake --  Output 850 ml  Net -850 ml    Filed Weights   07/16/21 0500 07/17/21 0500 07/19/21 0500  Weight: 98 kg 98.5 kg 103.3 kg   Weight change:  Body mass index is 39.09 kg/m.   Physical Exam: General exam: Pleasant, elderly African-American female.  Not in physical distress Skin: No rashes, lesions or ulcers. HEENT: Atraumatic, normocephalic, no obvious bleeding Lungs: Clear to auscultation bilaterally CVS: Regular rate and rhythm, no murmur GI/Abd soft, nontender, nondistended, bowel sound present CNS: Alert, awake, oriented times 3 Psychiatry: Mood appropriate Extremities: Pedal edema trace bilaterally, right more than left.  Left upper extremity lymphedema  Data Review: I have personally reviewed the laboratory data and studies available.  F/u labs ordered Unresulted Labs (From admission, onward)     Start     Ordered   07/12/21 0500  CBC with Differential/Platelet  Daily,   R      07/11/21 1504   Unscheduled  Occult blood card to lab, stool  As needed,   R      07/14/21 0802            Signed, Terrilee Croak, MD [facility] 07/20/2021

## 2021-07-20 NOTE — Progress Notes (Signed)
Contacted ortho tech at pager number [phone] regarding lymphedema compression sleeve.  Ortho tech stated that we do not carry compression sleeves.   Patient has teds cut to fit her left upper extremity for swelling.

## 2021-07-20 NOTE — TOC Progression Note (Signed)
Transition of Care [facility]) - Progression Note    Patient Details  Name: Danielle [name] MRN: [id number] Date of Birth: 09/21/1945  [facility] CM/SW Contact  Joanne Chars, LCSW Phone Number: 07/20/2021, 11:36 AM  Clinical Narrative:   CSW spoke with daughter Varney Biles who was on the floor, she does not want to move forward with [facility].  She asked about [name], [place] spoke with [facility], who is closed to all admissions currently.  1130: TC Keisha, updated on the above, she would like to move forward with [facility] and is planning to visit this afternoon as well.  CSW spoke with Shirlee Limerick at [facility] and she will initiate auth with [employer].     Expected Discharge Plan: [place] Barriers to Discharge: Continued Medical Work up, SNF Pending bed offer  Expected Discharge Plan and Services Expected Discharge Plan: [place] In-house Referral: Clinical Social Work   Post Acute Care Choice: [place] Living arrangements for the past 2 months: Apartment                                       Social Determinants of Health (SDOH) Interventions    Readmission Risk Interventions No flowsheet data found.

## 2021-07-21 ENCOUNTER — Inpatient Hospital Stay (HOSPITAL_COMMUNITY): Payer: Medicare HMO

## 2021-07-21 DIAGNOSIS — T8453XA Infection and inflammatory reaction due to internal right knee prosthesis, initial encounter: Secondary | ICD-10-CM | POA: Diagnosis not present

## 2021-07-21 DIAGNOSIS — R609 Edema, unspecified: Secondary | ICD-10-CM

## 2021-07-21 LAB — GLUCOSE, CAPILLARY
Glucose-Capillary: 108 mg/dL — ABNORMAL HIGH (ref 70–99)
Glucose-Capillary: 101 mg/dL — ABNORMAL HIGH (ref 70–99)
Glucose-Capillary: 120 mg/dL — ABNORMAL HIGH (ref 70–99)
Glucose-Capillary: 121 mg/dL — ABNORMAL HIGH (ref 70–99)
Glucose-Capillary: 147 mg/dL — ABNORMAL HIGH (ref 70–99)

## 2021-07-21 MED ORDER — CARVEDILOL 3.125 MG PO TABS
3.1250 mg | ORAL_TABLET | Freq: Two times a day (BID) | ORAL | Status: DC
  Administered 2021-07-21 – 2021-08-02 (×19): 3.125 mg via ORAL
  Filled 2021-07-21 (×24): qty 1

## 2021-07-21 MED ORDER — DICLOFENAC SODIUM 1 % EX GEL
2.0000 g | Freq: Four times a day (QID) | CUTANEOUS | Status: DC | PRN
  Administered 2021-07-26 – 2021-07-27 (×2): 2 g via TOPICAL
  Filled 2021-07-21: qty 100

## 2021-07-21 NOTE — Progress Notes (Signed)
Subjective: 9 Days Post-Op s/p Procedure(s): EXCISIONAL TOTAL KNEE ARTHROPLASTY WITH ANTIBIOTIC SPACERS Doing well on the floor. Ambulating with assistance to the bedside commode. Went to the bathroom. Left arm swelling stable today, states it feels tight but not much pain. Had lymph nodes removed from left side.. Encouraged her to keep arm elevated on pillows above heart level.   Objective:  PE: VITALS:   Vitals:   07/20/21 2355 07/21/21 0440 07/21/21 0724 07/21/21 0732  BP:  (!) 101/45 (!) 109/37 (!) 118/45  Pulse: 78 73 75 74  Resp: 18 19 16 18   Temp:  98.3 F (36.8 C) 99.2 F (37.3 C)   TempSrc:  Oral Oral   SpO2: 100%  97% 99%  Weight:      Height:       Neurovascular intact Sensation intact distally Dorsiflexion/Plantar flexion intact Incision: aquacell dressing c/d/I. Tolerated knee ROM 0-90 this morning. Compartment soft Swelling of the left ankle Endorses distal sensation  LABS  Results for orders placed or performed during the hospital encounter of 07/10/21 (from the past 24 hour(s))  Glucose, capillary     Status: None   Collection Time: 07/20/21 11:09 AM  Result Value Ref Range   Glucose-Capillary 93 70 - 99 mg/dL  Glucose, capillary     Status: Abnormal   Collection Time: 07/20/21  4:00 PM  Result Value Ref Range   Glucose-Capillary 144 (H) 70 - 99 mg/dL  Glucose, capillary     Status: None   Collection Time: 07/20/21  8:14 PM  Result Value Ref Range   Glucose-Capillary 98 70 - 99 mg/dL  Glucose, capillary     Status: Abnormal   Collection Time: 07/21/21  3:08 AM  Result Value Ref Range   Glucose-Capillary 108 (H) 70 - 99 mg/dL  Glucose, capillary     Status: Abnormal   Collection Time: 07/21/21  7:34 AM  Result Value Ref Range   Glucose-Capillary 101 (H) 70 - 99 mg/dL    No results found.  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  Hemoglobin 5.4, HCT 18.3  Patient refusing blood products at this time as she is a Sales promotion account executive witness. Daughter overridden no blood order on 2/17 and she received 2 units PRBC for Hbg of 4.1.  Post op recs: WB: 50% PWB, knee immobilizer when walking.  Can be removed when in bed or working with physical therapy Dressing: Prevana removed 2/22. Aquacell dressing applied to be left on for additional 2 weeks until suture removal. Abx: continue ancef per infectious disease recommendations, follow-up Intra-Op cultures: MSSA. Aspiration results from clinic aspiration on 07/10/21 growing MSSA. Imaging: PACU xrays DVT prophylaxis: per medicine team, eliquis being held at this time given severe anemia. Getting  subQ heparin prophylaxis dosing Follow up: 2-3 week after surgery for a wound check with Dr. Zachery Dakins at [facility].  Address: [address] [place], [place], [place] [address]  Office Phone: [phone]   Contact information:   After hours and holidays please check [email or web address] for group call information for [facility]  Hanaa Payes A Elanor Cale 07/21/2021, 7:51 AM

## 2021-07-21 NOTE — Progress Notes (Signed)
Left upper extremity venous duplex completed. Refer to "CV Proc" under chart review to view preliminary results.  Preliminary results discussed with Dr. Pietro Cassis.  07/21/2021 3:45 PM Kelby Aline., MHA, RVT, RDCS, RDMS

## 2021-07-21 NOTE — Progress Notes (Addendum)
PROGRESS NOTE  Danielle [name]  DOB: [birth year]  PCP: Seward Carol, MD IHK:[id number]  DOA: 07/10/2021  LOS: 34 days  Hospital Day: 12  Brief narrative: Danielle [name] is a 76 y.o. 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 She presented to the ED on 2/13 for evaluation of failed antibiotic treatment for right knee recurrent septic arthritis.  December 2019, patient underwent right knee arthroplasty by Dr. Mardelle Matte  November 2022, patient was seen in his office for severe acute pain and swelling of the right knee.  Right knee aspiration at that time was concerning for staph infection of her prosthetic knee joint.  She underwent I&D with liner exchange and was treated postoperatively with IV antibiotics.  She did well overall and was transitioned as an outpatient from the cefazolin to suppression with Keflex.  Evidently she ran out of Keflex approximately a week ago.  Also for the past 2 to 3 weeks, patient was getting progressively worsening pain and difficulty with ambulation and hence she was admitted on 2/13.  On 2/15, she had explant right total knee arthroplasty with implantation of articulating antibiotic spacer. Postoperatively, she was monitored in ICU and transferred to [facility] next day only to note that the hemoglobin had dropped from 9.2-4.1.  She was transferred back to ICU.  She is a Jehovah witness and in the past refusing blood transfusion.  While in ICU, she was lethargic.  Her daughter gave consent for blood products and hence she got 2 units of PRBC transfusion.  Subsequently patient woke up and then declined any further transfusion. Her hemoglobin has been running in low but stable range since then.  Subjective: Patient was seen and examined this  morning.   Propped up in bed.  Not in distress.  No new symptoms.  Left upper extremity lymphedema persist.  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  Patient underwent explant right total knee arthroplasty with implantation of articulating antibiotic spacer by Dr. Zachery Dakins on 07/12/2021.    -Infectious disease was consulted.  Patient was transitioned to IV cefazolin.  PICC line has been placed.  Last day of treatment will be 08/23/21.  OPAT orders placed by ID.  Acute on chronic anemia Jehovah witness refusing blood transfusion -Baseline hemoglobin between 8 and 9.   -Postoperatively, patient had a significant drop in hemoglobin from 9.2 to 4.1 on 2/16.   -While in ICU, patient was lethargic and confused.  Consent was obtained from daughter for blood transfusion. She was transfused 2 units of PRBC in the ICU.  CT abdomen pelvis did not show any evidence of  retroperitoneal bleed. -For the last 5 days, her hemoglobin has been stable at 5.3.-5.4 Recent Labs    07/10/21 1404 07/12/21 0343 07/14/21 0407 07/14/21 0640 07/14/21 1439 07/15/21 0621 07/16/21 0434 07/17/21 1659 07/18/21 0244 07/20/21 0359  HGB 10.3* 9.2* 4.1* 4.1* 4.9* 5.0*  5.3* 5.3* 5.3* 5.4*   Uncontrolled type 2 diabetes mellitus -A1c 8.2 on 07/10/2021 -Home meds include Lantus 30 units, sliding scale insulin -Currently on Semglee 40 units daily and sliding scale insulin -Continue Neurontin for peripheral neuropathy Recent Labs  Lab 07/20/21 1600 07/20/21 2014 07/21/21 0308 07/21/21 0734 07/21/21 1200  GLUCAP 144* 98 108* 101* 120*   CAD/stents -Had a stent 3 times in the past, last in 2017. -Currently not having anginal symptoms. -Seen by cardiology on 2/14. -Continue Lipitor.  Eliquis on hold  PAF -Eliquis on hold because of severe anemia.  Chronic combined systolic and diastolic CHF History of hypertension -Last echo from December 2021 with EF 45 to 84%, grade 2 diastolic dysfunction, mildly elevated pulmonary artery systolic pressure -Home meds include Coreg 12.5 mg twice daily, torsemide 20 mg daily, losartan 25 mg daily and Aldactone 12.5 mg daily. -Currently all BP/CHF meds on hold.  Blood pressure gradually improving.  Resume Coreg at a lower dose today.  Keep torsemide, losartan and Aldactone on hold.    CKD stage IIIa -Creatinine level stable -Continue losartan and torsemide. Recent Labs    04/19/21 0205 04/21/21 0250 04/24/21 0229 06/22/21 1243 07/10/21 1404 07/12/21 0343 07/13/21 0834 07/14/21 0407 07/15/21 0621 07/16/21 0434 07/20/21 0359  BUN 30* 24* 18 35* 21 19 25* 31* 37* 32*  --   CREATININE 1.27* 1.23* 1.17* 1.49* 1.22* 1.36* 1.41* 1.59* 1.56* 1.23* 1.09*   Hypothyroidism -Synthroid  Morbid obesity  -Body mass index is 39.09 kg/m. Patient has been advised to make an attempt to improve diet and exercise patterns to aid in weight loss.  OSA -CPAP nightly  Left upper extremity DVT History of left breast cancer Left upper extremity lymphedema -Patient states that her left upper extremity lymphedema is worse than normal.  This is secondary to new DVT.  Unable to anticoagulate because of significantly low  hemoglobin.  Patient was tried on compression sleeve yesterday but activity woke up as she complained of pain.  Mobility: PT eval postprocedure Goals of care   Code Status: Full Code    Nutritional status:  Body mass index is 39.09 kg/m.      Diet:  Diet Order             Diet Carb Modified Fluid consistency: Thin; Room service appropriate? Yes  Diet effective now                   DVT prophylaxis:  heparin injection 5,000 Units Start: 07/16/21 1100 SCDs Start: 07/10/21 1254   Antimicrobials: IV Ancef Fluid: None Family Communication: Daughter not at bedside today  Status is: Inpatient  Continue in-hospital care because: Pending SNF Level of care: Med-Surg   Dispo: The patient is from: Home              Anticipated d/c is to: Pending SNF              Patient currently is not medically stable to d/c.   Difficult to place patient No     Infusions:   sodium chloride 250 mL (07/13/21 0209)    ceFAZolin (ANCEF) IV 2 g (07/21/21 1350)   lactated ringers 10 mL/hr at 07/12/21 1516  Scheduled Meds:  atorvastatin  80 mg Oral QHS   carvedilol  3.125 mg Oral BID WC   Chlorhexidine Gluconate Cloth  6 each Topical Daily   empagliflozin  10 mg Oral Daily   ferrous sulfate  325 mg Oral BID WC   folic acid  1 mg Oral Daily   heparin injection (subcutaneous)  5,000 Units Subcutaneous Q12H   insulin aspart  0-15 Units Subcutaneous TID WC   insulin aspart  0-5 Units Subcutaneous QHS   insulin glargine-yfgn  40 Units Subcutaneous QHS   levothyroxine  75 mcg Oral Q0600   mouth rinse  15 mL Mouth Rinse BID   multivitamin with minerals  1 tablet Oral Daily   polyethylene glycol  17 g Oral Daily   senna  1 tablet Oral BID   sodium chloride flush  10-40 mL Intracatheter Q12H   thiamine  100 mg Oral Daily    PRN meds: acetaminophen, bisacodyl, diclofenac Sodium, diphenhydrAMINE, HYDROmorphone (DILAUDID) injection, lip balm, ondansetron **OR** ondansetron (ZOFRAN) IV,  oxyCODONE, sodium chloride flush   Antimicrobials: Anti-infectives (From admission, onward)    Start     Dose/Rate Route Frequency Ordered Stop   07/12/21 1916  tobramycin (NEBCIN) powder  Status:  Discontinued          As needed 07/12/21 1917 07/12/21 2124   07/12/21 1915  vancomycin (VANCOCIN) powder  Status:  Discontinued          As needed 07/12/21 1915 07/12/21 2124   07/12/21 1745  vancomycin (VANCOCIN) IVPB 1000 mg/200 mL premix        1,000 mg 200 mL/hr over 60 Minutes Intravenous  Once 07/12/21 1741 07/12/21 1759   07/12/21 1710  vancomycin (VANCOCIN) 1-5 GM/200ML-% IVPB       Note to Pharmacy: Hoy Morn E: cabinet override      07/12/21 1710 07/12/21 1742   07/10/21 1400  ceFAZolin (ANCEF) IVPB 2g/100 mL premix        2 g 200 mL/hr over 30 Minutes Intravenous Every 8 hours 07/10/21 1258         Objective: Vitals:   07/21/21 0732 07/21/21 1316  BP: (!) 118/45 127/66  Pulse: 74 73  Resp: 18 19  Temp:  99.4 F (37.4 C)  SpO2: 99% 97%    Intake/Output Summary (Last 24 hours) at 07/21/2021 1427 Last data filed at 07/21/2021 0725 Gross per 24 hour  Intake 120 ml  Output 1150 ml  Net -1030 ml   Filed Weights   07/16/21 0500 07/17/21 0500 07/19/21 0500  Weight: 98 kg 98.5 kg 103.3 kg   Weight change:  Body mass index is 39.09 kg/m.   Physical Exam: General exam: Pleasant, elderly African-American female.  Mild distress because of left upper extremity lymphedema Skin: No rashes, lesions or ulcers. HEENT: Atraumatic, normocephalic, no obvious bleeding Lungs: Clear to auscultation bilaterally CVS: Regular rate and rhythm, no murmur GI/Abd soft, nontender, nondistended, bowel sound present CNS: Alert, awake, oriented times 3 Psychiatry: Mood appropriate Extremities: Pedal edema trace bilaterally, right more than left.  Left upper extremity lymphedema persists  Data Review: I have personally reviewed the laboratory data and studies available.  F/u labs  ordered Unresulted Labs (From admission, onward)     Start     Ordered   07/22/21 0500  Hemoglobin and hematocrit, blood  Tomorrow morning,   R       Question:  Specimen collection method  Answer:  IV Team=IV Team collect   07/21/21 1426  07/22/21 0500  Creatinine, serum  Tomorrow morning,   R       Question:  Specimen collection method  Answer:  IV Team=IV Team collect   07/21/21 1426   07/12/21 0500  CBC with Differential/Platelet  Daily,   R,   Status:  Canceled      07/11/21 1504            Signed, Terrilee Croak, MD [facility] 07/21/2021

## 2021-07-21 NOTE — Progress Notes (Signed)
Physical Therapy Treatment Patient Details Name: Danielle [name] MRN: [id number] DOB: Nov 22, 1945 Today's Date: 07/21/2021   History of Present Illness Patient is a 76 y/o female admitted due to R TKA infection.  Initially had TKA 04/2018 then had pain/swelling 03/2021 with I&D and liner exchange and IV antibotics.  Admitted with worse pain and difficulty ambulating with fevers.  S/p R explant TKA w/implantation of articulating antibiotic spacer on 07/12/2021.  Post op had severe anemia with transfer to ICU due to [facility] witness and refusing blood products.  Did finally have transfusion 2/17 with hemoglobin at 5.0 after transfusion.  PMH positive for HFpEF, CAD w/ multiple prior PCIs, SSS s/p PPM, Afib on AC, OSA on CPAP, CKD, HTN, DM.    PT Comments    Pt received supine with c/o R knee pain and LUE discomfort. Pt limited by lethargy this session with reduced motivation for participation even with max encouragement throughout. Pt agreeable to repositioning in bed and gentle P/A/AAROM to RLE. LUE elevated above heart to comfort and RLE positioned in full extension and neutral IR/ER. Pt verbalized understanding of importance of continued mobility and participation. Pt continues to benefit from skilled PT services to progress toward functional mobility goals.    Recommendations for follow up therapy are one component of a multi-disciplinary discharge planning process, led by the attending physician.  Recommendations may be updated based on patient status, additional functional criteria and insurance authorization.  Follow Up Recommendations  Skilled nursing-short term rehab (<3 hours/day)     Assistance Recommended at Discharge Frequent or constant Supervision/Assistance  Patient can return home with the following Two people to help with walking and/or transfers;Two people to help with bathing/dressing/bathroom;Assistance with feeding   Equipment Recommendations  None recommended by PT     Recommendations for Other Services       Precautions / Restrictions Precautions Precautions: Fall;Knee Required Braces or Orthoses: Knee Immobilizer - Right Knee Immobilizer - Right: On when out of bed or walking Restrictions Weight Bearing Restrictions: Yes RLE Weight Bearing: Partial weight bearing RLE Partial Weight Bearing Percentage or Pounds: 50     Mobility  Bed Mobility Overal bed mobility: Needs Assistance Bed Mobility: Rolling Rolling: Mod assist         General bed mobility comments: mod assist to roll R/L to resposition    Transfers                        Ambulation/Gait                   Stairs             Wheelchair Mobility    Modified Rankin (Stroke Patients Only)       Balance                                            Cognition Arousal/Alertness: Lethargic Behavior During Therapy: Anxious Overall Cognitive Status: Impaired/Different from baseline Area of Impairment: Attention, Following commands, Safety/judgement                   Current Attention Level: Focused   Following Commands: Follows one step commands with increased time, Follows one step commands inconsistently Safety/Judgement: Decreased awareness of safety     General Comments: continued irritation this session by efforts to have her move and refusing to move despite  encouragement and multiple trials        Exercises General Exercises - Lower Extremity Ankle Circles/Pumps: AROM, Both, 20 reps, Supine Quad Sets: AROM, Right, 5 reps, Supine Heel Slides: PROM, AAROM, Right, Supine, AROM, 10 reps Straight Leg Raises: PROM, AAROM, Right, 10 reps, Supine Other Exercises Other Exercises: PROM long axis RLE IR/ER x 20    General Comments        Pertinent Vitals/Pain Pain Assessment Pain Assessment: Faces Faces Pain Scale: Hurts little more Pain Location: R knee Pain Descriptors / Indicators: Grimacing, Guarding,  Moaning Pain Intervention(s): Limited activity within patient's tolerance, Monitored during session, Repositioned    Home Living                          Prior Function            PT Goals (current goals can now be found in the care plan section) Acute Rehab PT Goals Patient Stated Goal: to go to rehab PT Goal Formulation: With patient/family Time For Goal Achievement: 07/29/21    Frequency    Min 3X/week      PT Plan      Co-evaluation              AM-PAC PT "6 Clicks" Mobility   Outcome Measure  Help needed turning from your back to your side while in a flat bed without using bedrails?: Total Help needed moving from lying on your back to sitting on the side of a flat bed without using bedrails?: Total Help needed moving to and from a bed to a chair (including a wheelchair)?: Total Help needed standing up from a chair using your arms (e.g., wheelchair or bedside chair)?: Total Help needed to walk in hospital room?: Total Help needed climbing 3-5 steps with a railing? : Total 6 Click Score: 6    End of Session   Activity Tolerance: Patient limited by fatigue;Patient limited by pain;Patient limited by lethargy Patient left: in bed;with call bell/phone within reach;with bed alarm set Nurse Communication: Mobility status PT Visit Diagnosis: Difficulty in walking, not elsewhere classified (R26.2);Pain;Muscle weakness (generalized) (M62.81) Pain - Right/Left: Right Pain - part of body: Knee     Time: 1147-1200 PT Time Calculation (min) (ACUTE ONLY): 13 min  Charges:  $Therapeutic Activity: 8-22 mins                     Heath Tesler R. PTA Acute Rehabilitation Services Office: [place] 07/21/2021, 12:09 PM

## 2021-07-22 DIAGNOSIS — T8453XA Infection and inflammatory reaction due to internal right knee prosthesis, initial encounter: Secondary | ICD-10-CM | POA: Diagnosis not present

## 2021-07-22 LAB — HEMOGLOBIN AND HEMATOCRIT, BLOOD
HCT: 19.3 % — ABNORMAL LOW (ref 36.0–46.0)
Hemoglobin: 5.6 g/dL — CL (ref 12.0–15.0)

## 2021-07-22 LAB — CREATININE, SERUM
Creatinine, Ser: 1.11 mg/dL — ABNORMAL HIGH (ref 0.44–1.00)
GFR, Estimated: 52 mL/min — ABNORMAL LOW (ref >60)

## 2021-07-22 LAB — GLUCOSE, CAPILLARY
Glucose-Capillary: 91 mg/dL (ref 70–99)
Glucose-Capillary: 132 mg/dL — ABNORMAL HIGH (ref 70–99)
Glucose-Capillary: 103 mg/dL — ABNORMAL HIGH (ref 70–99)
Glucose-Capillary: 165 mg/dL — ABNORMAL HIGH (ref 70–99)

## 2021-07-22 MED ORDER — TORSEMIDE 20 MG PO TABS
20.0000 mg | ORAL_TABLET | Freq: Every day | ORAL | Status: DC
  Administered 2021-07-22 – 2021-07-23 (×2): 20 mg via ORAL
  Filled 2021-07-22 (×2): qty 1

## 2021-07-22 MED ORDER — DARBEPOETIN ALFA 150 MCG/0.3ML IJ SOSY
150.0000 ug | PREFILLED_SYRINGE | Freq: Once | INTRAMUSCULAR | Status: AC
  Administered 2021-07-22: 150 ug via SUBCUTANEOUS
  Filled 2021-07-22: qty 0.3

## 2021-07-22 NOTE — Progress Notes (Signed)
Patient with complaints of abdominal pain and feelings of constipation. Patient given soap suds enema this evening with positive results. Patient stated that she feels "much better". Will continue to monitor.

## 2021-07-22 NOTE — Progress Notes (Signed)
PROGRESS NOTE  Danielle [name]  DOB: 1946/04/22  PCP: Danielle Carol, MD IRC:[phone]  DOA: 07/10/2021  LOS: 33 days  Hospital Day: 13  Brief narrative: Danielle [name] is a 76 y.o. 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 She presented to the ED on 2/13 for evaluation of failed antibiotic treatment for right knee recurrent septic arthritis.  -December 2019, patient underwent right knee arthroplasty by Dr. Mardelle [name]  -November 2022, patient was seen in his office for severe acute pain and swelling of the right knee.  Right knee aspiration at that time was concerning for staph infection of her prosthetic knee joint.  She underwent I&D with liner exchange and was treated postoperatively with IV antibiotics.  She did well overall and was transitioned as an outpatient from the cefazolin to suppression with Keflex.  Evidently she ran out of Keflex approximately a week ago.  Also for the past 2 to 3 weeks, patient was getting progressively worsening pain and difficulty with ambulation and hence she was admitted on 2/13.  On 2/15, she had explant right total knee arthroplasty with implantation of articulating antibiotic spacer. Postoperatively, she was monitored in ICU and transferred to Danielle [name] next day only to note that the hemoglobin had dropped from 9.2-4.1.  She was transferred back to ICU.  She is a Jehovah witness and in the past refusing blood transfusion.  While in ICU, she was lethargic.  Her daughter gave consent for blood products and hence she got 2 units of PRBC transfusion.  Subsequently patient woke up and then declined any further transfusion. Her hemoglobin has been running in low but stable range since then.  Subjective: Patient was seen and examined this  morning.   Not in distress.  No new symptoms.  Left upper extremity swelling persist.  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  Patient underwent explant right total knee arthroplasty with implantation of articulating antibiotic spacer by Dr. Zachery [name] on 07/12/2021.    -Infectious disease was consulted.  Patient was transitioned to IV cefazolin.  PICC line has been placed.  Last day of treatment will be 08/23/21.  OPAT orders placed by ID.  Acute on chronic anemia Jehovah witness refusing blood transfusion -Baseline hemoglobin between 8 and 9.   -Postoperatively, patient had a significant drop in hemoglobin from 9.2 to 4.1 on 2/16.   -While in ICU, patient was lethargic and confused.  Consent was obtained from daughter for blood transfusion. She was transfused 2 units of PRBC in the ICU.  CT abdomen pelvis did not show any evidence of retroperitoneal bleed. -For the last  5 days, her hemoglobin has been stable at 5.3.-5.4 Recent Labs    07/12/21 0343 07/14/21 0407 07/14/21 0640 07/14/21 1439 07/15/21 0621 07/16/21 0434 07/17/21 1659 07/18/21 0244 07/20/21 0359 07/22/21 0328  HGB 9.2* 4.1* 4.1* 4.9* 5.0* 5.3* 5.3* 5.3* 5.4* 5.6*    Left upper extremity DVT History of left breast cancer Left upper extremity lymphedema -Patient has history of breast cancer with axillary lymph nodes resection after which she has chronic left upper extremity lymphedema.  However this hospitalization, she states it is worse than usual.   -2/24, ultrasound duplex showed an acute DVT involving a single left brachial vein.   -It is a difficult decision to make whether or not to start on anticoagulation.  She was previously on Eliquis for A-fib which is currently on hold because of anemia.  She is a Jehovah witness and hence we have been trying to minimize the risk of worsening anemia.  As of now, she is not actively bleeding and hemoglobin has been gradually improving for last 1 week.  I have been discussing this with her daughter [name].  She wants to get an opinion from patient's cardiologist Dr. Haroldine [name].  At this time we have not started her on anticoagulation.  Uncontrolled type 2 diabetes mellitus -A1c 8.2 on 07/10/2021 -Home meds include Lantus 30 units, sliding scale insulin -Currently blood sugars controlled Semglee 40 units daily and sliding scale insulin -Continue Neurontin for peripheral neuropathy Recent Labs  Lab 07/21/21 1707 07/21/21 2051 07/22/21 0822 07/22/21 1323 07/22/21 1545  GLUCAP 121* 147* 91 132* 103*   CAD/stents -Had a stent 3 times in the past, last in 2017. -Currently not having anginal symptoms. -Seen by cardiology on 2/14. -Continue Lipitor.  Eliquis on hold  PAF -Eliquis on hold because of severe anemia.  Chronic combined systolic and diastolic CHF History of hypertension -Last echo from December 2021 with EF 45 to 81%, grade 2 diastolic dysfunction, mildly elevated pulmonary artery systolic pressure -Home meds include Coreg 12.5 mg twice daily, torsemide 20 mg daily, losartan 25 mg daily and Aldactone 12.5 mg daily. -Because of low blood pressure, AKI, she has not meds were held.  Blood  pressure improving, creatinine improving.  I resumed her on low-dose Coreg yesterday.  I resumed torsemide today.  Losartan and Aldactone remain on hold.  CKD stage IIIa -Creatinine level stable -Torsemide resumed.  Losartan on hold. Recent Labs    04/19/21 0205 04/21/21 0250 04/24/21 0229 06/22/21 1243 07/10/21 1404 07/12/21 0343 07/13/21 0834 07/14/21 0407 07/15/21 0621 07/16/21 0434 07/20/21 0359 07/22/21 0328  BUN 30* 24* 18 35* 21 19 25* 31* 37* 32*  --   --   CREATININE 1.27* 1.23* 1.17* 1.49* 1.22* 1.36* 1.41* 1.59* 1.56* 1.23* 1.09* 1.11*   Hypothyroidism -Synthroid  Morbid obesity  -Body mass index is 38.71 kg/m. Patient has been advised to make an attempt to improve diet and exercise patterns to aid in weight loss.  OSA -CPAP nightly   Mobility: PT eval postprocedure Goals of care   Code Status: Full Code    Nutritional status:  Body mass index is 38.71 kg/m.      Diet:  Diet Order             Diet Carb Modified Fluid consistency: Thin; Room service appropriate? Yes  Diet effective now                   DVT prophylaxis:  heparin injection 5,000 Units Start: 07/16/21 1100 SCDs  Start: 07/10/21 1254   Antimicrobials: IV Ancef Fluid: None Family Communication: Daughter not at bedside today.  Called and left a message.  Status is: Inpatient  Continue in-hospital care because: Pending SNF Level of care: Med-Surg   Dispo: The patient is from: Home              Anticipated d/c is to: Pending SNF              Patient currently is not medically stable to d/c.   Difficult to place patient No     Infusions:   sodium chloride 250 mL (07/13/21 0209)    ceFAZolin (ANCEF) IV 2 g (07/22/21 1331)   lactated ringers 10 mL/hr at 07/22/21 1329    Scheduled Meds:  atorvastatin  80 mg Oral QHS   carvedilol  3.125 mg Oral BID WC   Chlorhexidine Gluconate Cloth  6 each Topical Daily   empagliflozin  10 mg Oral Daily   ferrous sulfate  325 mg  Oral BID WC   folic acid  1 mg Oral Daily   heparin injection (subcutaneous)  5,000 Units Subcutaneous Q12H   insulin aspart  0-15 Units Subcutaneous TID WC   insulin aspart  0-5 Units Subcutaneous QHS   insulin glargine-yfgn  40 Units Subcutaneous QHS   levothyroxine  75 mcg Oral Q0600   mouth rinse  15 mL Mouth Rinse BID   multivitamin with minerals  1 tablet Oral Daily   polyethylene glycol  17 g Oral Daily   senna  1 tablet Oral BID   sodium chloride flush  10-40 mL Intracatheter Q12H   thiamine  100 mg Oral Daily   torsemide  20 mg Oral Daily    PRN meds: acetaminophen, bisacodyl, diclofenac Sodium, diphenhydrAMINE, HYDROmorphone (DILAUDID) injection, lip balm, ondansetron **OR** ondansetron (ZOFRAN) IV, oxyCODONE, sodium chloride flush   Antimicrobials: Anti-infectives (From admission, onward)    Start     Dose/Rate Route Frequency Ordered Stop   07/12/21 1916  tobramycin (NEBCIN) powder  Status:  Discontinued          As needed 07/12/21 1917 07/12/21 2124   07/12/21 1915  vancomycin (VANCOCIN) powder  Status:  Discontinued          As needed 07/12/21 1915 07/12/21 2124   07/12/21 1745  vancomycin (VANCOCIN) IVPB 1000 mg/200 mL premix        1,000 mg 200 mL/hr over 60 Minutes Intravenous  Once 07/12/21 1741 07/12/21 1759   07/12/21 1710  vancomycin (VANCOCIN) 1-5 GM/200ML-% IVPB       Note to Pharmacy: Hoy Morn E: cabinet override      07/12/21 1710 07/12/21 1742   07/10/21 1400  ceFAZolin (ANCEF) IVPB 2g/100 mL premix        2 g 200 mL/hr over 30 Minutes Intravenous Every 8 hours 07/10/21 1258         Objective: Vitals:   07/22/21 1316 07/22/21 1547  BP: 96/65 132/60  Pulse: 76 74  Resp: 16 16  Temp: 98.4 F (36.9 C) 98.1 F (36.7 C)  SpO2: 99% 100%    Intake/Output Summary (Last 24 hours) at 07/22/2021 1636 Last data filed at 07/22/2021 1530 Gross per 24 hour  Intake 731.42 ml  Output 350 ml  Net 381.42 ml   Filed Weights   07/17/21 0500 07/19/21  0500 07/22/21 0500  Weight: 98.5 kg 103.3 kg 102.3 kg   Weight change:  Body mass index is 38.71 kg/m.   Physical Exam: General  exam: Pleasant, elderly African-American female.  Mild distress because of left upper extremity lymphedema Skin: No rashes, lesions or ulcers. HEENT: Atraumatic, normocephalic, no obvious bleeding Lungs: Clear to auscultation bilaterally CVS: Regular rate and rhythm, no murmur GI/Abd soft, nontender, nondistended, bowel sound present CNS: Alert, awake, oriented X 3 Psychiatry: Mood appropriate Extremities: Pedal edema trace bilaterally, right more than left.  Left upper extremity lymphedema persists.    Data Review: I have personally reviewed the laboratory data and studies available.  F/u labs ordered Unresulted Labs (From admission, onward)     Start     Ordered   07/12/21 0500  CBC with Differential/Platelet  Daily,   R,   Status:  Canceled      07/11/21 1504            Signed, Terrilee Croak, MD [facility] 07/22/2021

## 2021-07-22 NOTE — Plan of Care (Signed)
VSS. OOB to BSC with stedy. C/o pain, given PRN meds. Call bell within reach. Bed alarm on.   Patient feels constipated and wants to have a BM. Given all stool softners order. Patient informed RN can do enema, patient wanted to eat dinner and drink prune juice before doing this route. Night RN made aware.   Problem: Clinical Measurements: Goal: Ability to maintain clinical measurements within normal limits will improve Outcome: Progressing Goal: Will remain free from infection Outcome: Progressing Goal: Diagnostic test results will improve Outcome: Progressing Goal: Respiratory complications will improve Outcome: Progressing Goal: Cardiovascular complication will be avoided Outcome: Progressing   Problem: Elimination: Goal: Will not experience complications related to bowel motility Outcome: Progressing Goal: Will not experience complications related to urinary retention Outcome: Progressing   Problem: Pain Managment: Goal: General experience of comfort will improve Outcome: Progressing   Problem: Safety: Goal: Ability to remain free from injury will improve Outcome: Progressing   Problem: Skin Integrity: Goal: Risk for impaired skin integrity will decrease Outcome: Progressing

## 2021-07-23 DIAGNOSIS — T8453XA Infection and inflammatory reaction due to internal right knee prosthesis, initial encounter: Secondary | ICD-10-CM | POA: Diagnosis not present

## 2021-07-23 LAB — GLUCOSE, CAPILLARY
Glucose-Capillary: 78 mg/dL (ref 70–99)
Glucose-Capillary: 79 mg/dL (ref 70–99)
Glucose-Capillary: 122 mg/dL — ABNORMAL HIGH (ref 70–99)
Glucose-Capillary: 148 mg/dL — ABNORMAL HIGH (ref 70–99)

## 2021-07-23 MED ORDER — INSULIN GLARGINE-YFGN 100 UNIT/ML ~~LOC~~ SOLN
35.0000 [IU] | Freq: Every day | SUBCUTANEOUS | Status: DC
  Administered 2021-07-23 – 2021-08-01 (×9): 35 [IU] via SUBCUTANEOUS
  Filled 2021-07-23 (×11): qty 0.35

## 2021-07-23 NOTE — Progress Notes (Signed)
Pharmacy Antibiotic Note  Danielle [name] is a 76 y.o. female admitted on 07/10/2021 with  prosthetic joint infection .  Pharmacy  consulted on 07/10/21 for cefazolin dosing for right knee prosthetic joint infection. Started on Cefazolin 2 g IV q8h.   S/p explant and abx spacer 07/12/21 with orthopedics.  R. knee wound cultures: MSSA infection.  CKD stage IIIa, SCr stable yesterday at 1.1.  ID plan: Cefazolin for 6 weeks from OR date, end date 08/23/21.    Plan: Continue Cefazolin 2 grams IV q8h  (End date 08/23/21) Monitor clinical status, renal function and culture results.  OPAT orders entered by ID pharmacist.   Height: 5\' 4"  (162.6 cm) Weight: 102.3 kg (225 lb 8.5 oz) IBW/kg (Calculated) : 54.7  Temp (24hrs), Avg:98.6 F (37 C), Min:98.1 F (36.7 C), Max:99.5 F (37.5 C)  Recent Labs  Lab 07/17/21 1659 07/20/21 0359 07/22/21 0328  WBC 10.1  --   --   CREATININE  --  1.09* 1.11*     Estimated Creatinine Clearance: 51.7 mL/min (A) (by C-G formula based on SCr of 1.11 mg/dL (H)).    Allergies  Allergen Reactions   Other Other (See Comments)    Blood Product Refusal (Jehovah's witness)     Tape Other (See Comments)    Leaves blisters and marks on the skin   Sulfa Antibiotics Rash   Uloric [Febuxostat] Rash    Antimicrobials this admission: 2/13 cefazolin >>   Dose adjustments this admission: No adjustments at this time  Microbiology results: 2/13 R knee asp cx (outpt ortho clinic): MSSA 2/14 MRSA PCR: negative 2/15 R knee cx x 3: MSSA  2/16 MRSA PCR: negative 2/17 BCx x2:  negative    Thank you for allowing [place] to participate in this patients care. Jens Som, PharmD 07/23/2021 7:29 AM  **Pharmacist phone directory can be found on [email or web address] listed under [place]**

## 2021-07-23 NOTE — Progress Notes (Addendum)
PROGRESS NOTE  Danielle [name]  DOB: [birth year]  PCP: Seward Carol, MD MIW:[id number]  DOA: 07/10/2021  LOS: 73 days  Hospital Day: 14  Brief narrative: Danielle [name] is a 76 y.o. 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 She presented to the ED on 2/13 for evaluation of failed antibiotic treatment for right knee recurrent septic arthritis.  -December 2019, patient underwent right knee arthroplasty by Dr. Mardelle Matte  -November 2022, patient was seen in his office for severe acute pain and swelling of the right knee.  Right knee aspiration at that time was concerning for staph infection of her prosthetic knee joint.  She underwent I&D with liner exchange and was treated postoperatively with IV antibiotics.  She did well overall and was transitioned as an outpatient from the cefazolin to suppression with Keflex.  Evidently she ran out of Keflex approximately a week ago.  Also for the past 2 to 3 weeks, patient was getting progressively worsening pain and difficulty with ambulation and hence she was admitted on 2/13.  On 2/15, she had explant right total knee arthroplasty with implantation of articulating antibiotic spacer. Postoperatively, she was monitored in ICU and transferred to [facility] next day only to note that the hemoglobin had dropped from 9.2-4.1.  She was transferred back to ICU.  She is a Jehovah witness and in the past refusing blood transfusion.  While in ICU, she was lethargic.  Her daughter gave consent for blood products and hence she got 2 units of PRBC transfusion.  Subsequently patient woke up and then declined any further transfusion. Her hemoglobin has been running in low but stable range since then.  Subjective: Patient was seen and examined this  morning.   Lying on bed.  Does not feel good.  She states she was given enema late last night and she had to wake up last night for bowel movement.  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  Patient underwent explant right total knee arthroplasty with implantation of articulating antibiotic spacer by Dr. Zachery Dakins on 07/12/2021.    -Infectious disease was consulted.  Patient was transitioned to IV cefazolin.  PICC line has been placed.  Last day of treatment will be 08/23/21.  OPAT orders placed by ID.  Acute on chronic anemia Jehovah witness refusing blood transfusion -Baseline hemoglobin between 8 and 9.   -Postoperatively, patient had a significant drop in hemoglobin from 9.2 to 4.1 on 2/16.   -While in ICU, patient was lethargic and confused.  Consent was obtained from daughter for blood transfusion. She was transfused 2 units of PRBC in the  ICU.  CT abdomen pelvis did not show any evidence of retroperitoneal bleed. -For the last 5 days, her hemoglobin has been stable at 5.3.-5.4 repeat labs tomorrow Recent Labs    07/12/21 0343 07/14/21 0407 07/14/21 0640 07/14/21 1439 07/15/21 0621 07/16/21 0434 07/17/21 1659 07/18/21 0244  07/20/21 0359 07/22/21 0328  HGB 9.2* 4.1* 4.1* 4.9* 5.0* 5.3* 5.3* 5.3* 5.4* 5.6*    Left upper extremity DVT History of left breast cancer Left upper extremity lymphedema -Patient has history of breast cancer with axillary lymph nodes resection after which she has chronic left upper extremity lymphedema.  However this hospitalization, she states it is worse than usual.   -2/24, ultrasound duplex showed an acute DVT involving a single left brachial vein.   -It is a difficult decision to make whether or not to start on anticoagulation.  She was previously on Eliquis for A-fib which is currently on hold because of anemia.  She is a Jehovah witness and hence we have been trying to minimize the risk of worsening anemia.  As of now, she is not actively bleeding and hemoglobin has been gradually improving for last 1 week.  I have been discussing this with her [name].  She wants to get an opinion from patient's cardiologist Dr. Haroldine Laws.  At this time we have not started her on anticoagulation.  Uncontrolled type 2 diabetes mellitus -A1c 8.2 on 07/10/2021 -Home meds include Lantus 30 units, sliding scale insulin -Currently blood sugars controlled Semglee 40 units daily and sliding scale insulin.  Blood sugars running less than 100 this morning.  I will reduce tonight's Semglee dose to 35 units. -Continue Neurontin for peripheral neuropathy Recent Labs  Lab 07/22/21 1323 07/22/21 1545 07/22/21 2153 07/23/21 0748 07/23/21 1210  GLUCAP 132* 103* 165* 78 79    CAD/stents -Had a stent 3 times in the past, last in 2017. -Currently not having anginal symptoms. -Seen by cardiology on 2/14. -Continue Lipitor.  Eliquis on hold  PAF -Eliquis on hold because of severe anemia.  Chronic combined systolic and diastolic CHF History of hypertension -Last echo from December 2021 with EF 45 to 13%, grade 2 diastolic dysfunction, mildly elevated pulmonary artery systolic pressure -Home  meds include Coreg 12.5 mg twice daily, torsemide 20 mg daily, losartan 25 mg daily and Aldactone 12.5 mg daily. -Because of low blood pressure, AKI, she has not meds were held.  Blood pressure improving, creatinine improving.  I resumed her on low-dose Coreg yesterday.  I resumed torsemide today.  Losartan and Aldactone remain on hold.  CKD stage IIIa -Creatinine level stable -Torsemide resumed.  Losartan on hold. Recent Labs    04/19/21 0205 04/21/21 0250 04/24/21 0229 06/22/21 1243 07/10/21 1404 07/12/21 0343 07/13/21 0834 07/14/21 0407 07/15/21 0621 07/16/21 0434 07/20/21 0359 07/22/21 0328  BUN 30* 24* 18 35* 21 19 25* 31* 37* 32*  --   --   CREATININE 1.27* 1.23* 1.17* 1.49* 1.22* 1.36* 1.41* 1.59* 1.56* 1.23* 1.09* 1.11*    Hypothyroidism -Synthroid  Morbid obesity  -Body mass index is 38.71 kg/m. Patient has been advised to make an attempt to improve diet and exercise patterns to aid in weight loss.  OSA -CPAP nightly   Mobility: PT eval postprocedure Goals of care   Code Status: Full Code    Nutritional status:  Body mass index is 38.71 kg/m.      Diet:  Diet Order             Diet Carb  Modified Fluid consistency: Thin; Room service appropriate? Yes  Diet effective now                   DVT prophylaxis:  heparin injection 5,000 Units Start: 07/16/21 1100 SCDs Start: 07/10/21 1254   Antimicrobials: IV Ancef Fluid: None Family Communication: Daughter not at bedside today.  Last I talked to her yesterday evening.  Status is: Inpatient  Continue in-hospital care because: Pending SNF Level of care: Med-Surg   Dispo: The patient is from: Home              Anticipated d/c is to: Pending SNF              Patient currently is not medically stable to d/c.   Difficult to place patient No     Infusions:   sodium chloride 250 mL (07/13/21 0209)    ceFAZolin (ANCEF) IV 2 g (07/23/21 1336)   lactated ringers 10 mL/hr at 07/22/21 1329     Scheduled Meds:  atorvastatin  80 mg Oral QHS   carvedilol  3.125 mg Oral BID WC   Chlorhexidine Gluconate Cloth  6 each Topical Daily   empagliflozin  10 mg Oral Daily   ferrous sulfate  325 mg Oral BID WC   folic acid  1 mg Oral Daily   heparin injection (subcutaneous)  5,000 Units Subcutaneous Q12H   insulin aspart  0-15 Units Subcutaneous TID WC   insulin aspart  0-5 Units Subcutaneous QHS   insulin glargine-yfgn  40 Units Subcutaneous QHS   levothyroxine  75 mcg Oral Q0600   mouth rinse  15 mL Mouth Rinse BID   multivitamin with minerals  1 tablet Oral Daily   polyethylene glycol  17 g Oral Daily   senna  1 tablet Oral BID   sodium chloride flush  10-40 mL Intracatheter Q12H   thiamine  100 mg Oral Daily   torsemide  20 mg Oral Daily    PRN meds: acetaminophen, bisacodyl, diclofenac Sodium, diphenhydrAMINE, HYDROmorphone (DILAUDID) injection, lip balm, ondansetron **OR** ondansetron (ZOFRAN) IV, oxyCODONE, sodium chloride flush   Antimicrobials: Anti-infectives (From admission, onward)    Start     Dose/Rate Route Frequency Ordered Stop   07/12/21 1916  tobramycin (NEBCIN) powder  Status:  Discontinued          As needed 07/12/21 1917 07/12/21 2124   07/12/21 1915  vancomycin (VANCOCIN) powder  Status:  Discontinued          As needed 07/12/21 1915 07/12/21 2124   07/12/21 1745  vancomycin (VANCOCIN) IVPB 1000 mg/200 mL premix        1,000 mg 200 mL/hr over 60 Minutes Intravenous  Once 07/12/21 1741 07/12/21 1759   07/12/21 1710  vancomycin (VANCOCIN) 1-5 GM/200ML-% IVPB       Note to Pharmacy: Hoy Morn E: cabinet override      07/12/21 1710 07/12/21 1742   07/10/21 1400  ceFAZolin (ANCEF) IVPB 2g/100 mL premix        2 g 200 mL/hr over 30 Minutes Intravenous Every 8 hours 07/10/21 1258         Objective: Vitals:   07/23/21 1000 07/23/21 1400  BP: (!) 118/58   Pulse: 74 78  Resp: 18 (!) 23  Temp: 98.9 F (37.2 C)   SpO2: 98% 96%    Intake/Output  Summary (Last 24 hours) at 07/23/2021 1443 Last data filed at 07/23/2021 1336 Gross per 24 hour  Intake 300 ml  Output  1150 ml  Net -850 ml    Filed Weights   07/19/21 0500 07/22/21 0500 07/23/21 0500  Weight: 103.3 kg 102.3 kg 102.3 kg   Weight change: 0 kg Body mass index is 38.71 kg/m.   Physical Exam: General exam: Pleasant, elderly African-American female.  Mild distress because of left upper extremity lymphedema Skin: No rashes, lesions or ulcers. HEENT: Atraumatic, normocephalic, no obvious bleeding Lungs: Clear to auscultation bilaterally CVS: Regular rate and rhythm, no murmur GI/Abd soft, nontender, nondistended, bowel sound present CNS: Alert, awake, oriented X 3 Psychiatry: Mood appropriate Extremities: Pedal edema trace bilaterally, right more than left.  Left upper extremity lymphedema persists.    Data Review: I have personally reviewed the laboratory data and studies available.  F/u labs ordered Unresulted Labs (From admission, onward)     Start     Ordered   07/24/21 0500  Hemoglobin and hematocrit, blood  Tomorrow morning,   R       Question:  Specimen collection method  Answer:  IV Team=IV Team collect   07/23/21 0828   07/24/21 0500  Creatinine, serum  Tomorrow morning,   R       Question:  Specimen collection method  Answer:  IV Team=IV Team collect   07/23/21 0828   07/12/21 0500  CBC with Differential/Platelet  Daily,   R,   Status:  Canceled      07/11/21 1504            Signed, Terrilee Croak, MD [facility] 07/23/2021

## 2021-07-23 NOTE — Progress Notes (Signed)
RT placed patient on CPAP. Patient tolerating settings well at this time. RT will monitor as needed.

## 2021-07-24 DIAGNOSIS — T8453XA Infection and inflammatory reaction due to internal right knee prosthesis, initial encounter: Secondary | ICD-10-CM | POA: Diagnosis not present

## 2021-07-24 LAB — HEMOGLOBIN AND HEMATOCRIT, BLOOD
HCT: 20.8 % — ABNORMAL LOW (ref 36.0–46.0)
Hemoglobin: 6 g/dL — CL (ref 12.0–15.0)

## 2021-07-24 LAB — CREATININE, SERUM
Creatinine, Ser: 1.28 mg/dL — ABNORMAL HIGH (ref 0.44–1.00)
GFR, Estimated: 44 mL/min — ABNORMAL LOW (ref >60)

## 2021-07-24 LAB — GLUCOSE, CAPILLARY
Glucose-Capillary: 99 mg/dL (ref 70–99)
Glucose-Capillary: 112 mg/dL — ABNORMAL HIGH (ref 70–99)
Glucose-Capillary: 119 mg/dL — ABNORMAL HIGH (ref 70–99)
Glucose-Capillary: 112 mg/dL — ABNORMAL HIGH (ref 70–99)

## 2021-07-24 MED ORDER — DARBEPOETIN ALFA 150 MCG/0.3ML IJ SOSY
150.0000 ug | PREFILLED_SYRINGE | Freq: Once | INTRAMUSCULAR | Status: AC
  Administered 2021-07-28: 150 ug via SUBCUTANEOUS
  Filled 2021-07-24 (×2): qty 0.3

## 2021-07-24 MED ORDER — APIXABAN 5 MG PO TABS
5.0000 mg | ORAL_TABLET | Freq: Two times a day (BID) | ORAL | Status: DC
  Administered 2021-07-24 – 2021-08-02 (×18): 5 mg via ORAL
  Filled 2021-07-24 (×19): qty 1

## 2021-07-24 MED ORDER — DARBEPOETIN ALFA 150 MCG/0.3ML IJ SOSY: 150.0000 ug | PREFILLED_SYRINGE | Freq: Once | INTRAMUSCULAR | Status: DC

## 2021-07-24 MED ORDER — HYDROMORPHONE HCL 1 MG/ML IJ SOLN
0.2500 mg | INTRAMUSCULAR | Status: DC | PRN
  Administered 2021-07-24 – 2021-07-30 (×9): 0.25 mg via INTRAVENOUS
  Filled 2021-07-24 (×9): qty 0.5

## 2021-07-24 NOTE — Progress Notes (Signed)
Subjective: 12 Days Post-Op s/p Procedure(s): EXCISIONAL TOTAL KNEE ARTHROPLASTY WITH ANTIBIOTIC SPACERS Doing well on the floor. Daughter at bedside. Ms. Raether and family eager to work more with PT. Denies distal n/t. Pain well controlled. Swelling in the left arm stable.   Objective:  PE: VITALS:   Vitals:   07/23/21 2045 07/23/21 2231 07/24/21 0500 07/24/21 0723  BP:    (!) 107/50  Pulse:  73  73  Resp:  16  14  Temp:    98 F (36.7 C)  TempSrc:    Oral  SpO2: 92% 100%  92%  Weight:   103.1 kg   Height:       Neurovascular intact Sensation intact distally Dorsiflexion/Plantar flexion intact Incision: aquacell dressing c/d/I. Tolerated knee ROM 0-95 this morning. Compartment soft Swelling of the left ankle Endorses distal sensation  LABS  Results for orders placed or performed during the hospital encounter of 07/10/21 (from the past 24 hour(s))  Glucose, capillary     Status: Abnormal   Collection Time: 07/23/21  4:20 PM  Result Value Ref Range   Glucose-Capillary 122 (H) 70 - 99 mg/dL  Glucose, capillary     Status: Abnormal   Collection Time: 07/23/21  8:04 PM  Result Value Ref Range   Glucose-Capillary 148 (H) 70 - 99 mg/dL  Hemoglobin and hematocrit, blood     Status: Abnormal   Collection Time: 07/24/21  3:25 AM  Result Value Ref Range   Hemoglobin 6.0 (LL) 12.0 - 15.0 g/dL   HCT 20.8 (L) 36.0 - 46.0 %  Creatinine, serum     Status: Abnormal   Collection Time: 07/24/21  3:25 AM  Result Value Ref Range   Creatinine, Ser 1.28 (H) 0.44 - 1.00 mg/dL   GFR, Estimated 44 (L) >60 mL/min  Glucose, capillary     Status: None   Collection Time: 07/24/21  7:33 AM  Result Value Ref Range   Glucose-Capillary 99 70 - 99 mg/dL  Glucose, capillary     Status: Abnormal   Collection Time: 07/24/21 11:18 AM  Result Value Ref Range   Glucose-Capillary 112 (H) 70 - 99 mg/dL    No results found.  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  Hemoglobin 6.0  Patient refusing blood products at this time as she is a Sales promotion account executive witness. Daughter overridden no blood order on 2/17 and she received 2 units PRBC for Hbg of 4.1.  Post op recs: WB: 50% PWB, knee immobilizer when walking.  Can be removed when in bed or working with physical therapy Dressing: Prevana removed 2/22. Aquacell dressing applied to be left on for additional 2 weeks until suture removal. Abx: continue ancef per infectious disease recommendations, follow-up Intra-Op  cultures: MSSA. Aspiration results from clinic aspiration on 07/10/21 growing MSSA. Imaging: PACU xrays DVT prophylaxis: per medicine team, plan to resume full dose eliquis at this point to address LUE DVT and a. Fib. Follow up: 2-3 week after surgery for a wound check with Dr. Zachery Dakins at [facility].  Address: [address] [place], [place], [place] [address]  Office Phone: [phone]   Contact information:   After hours and holidays please check [email or web address] for group call information for [facility] 07/24/2021, 3:23 PM

## 2021-07-24 NOTE — Progress Notes (Signed)
Physical Therapy Treatment Patient Details Name: Danielle [name] MRN: [id number] DOB: 20-Dec-1945 Today's Date: 07/24/2021   History of Present Illness Patient is a 76 y/o female admitted due to R TKA infection.  Initially had TKA 04/2018 then had pain/swelling 03/2021 with I&D and liner exchange and IV antibotics.  Admitted with worse pain and difficulty ambulating with fevers.  S/p R explant TKA w/implantation of articulating antibiotic spacer on 07/12/2021.  Post op had severe anemia with transfer to ICU due to [facility] witness and refusing blood products.  Did finally have transfusion 2/17 with hemoglobin at 5.0 after transfusion.  PMH positive for HFpEF, CAD w/ multiple prior PCIs, SSS s/p PPM, Afib on AC, OSA on CPAP, CKD, HTN, DM.    PT Comments    Pt received supine and agreeable to session with much improved participation and progression of mobility towards goals. Pt needing up to min assist to come to sitting EOB, with pt able to initiative and mobilize RLE with strap to EOB, assisted pt to being LE off EOB and resting on wastebin. Pt able to come to standing in stedy with mod assist to power up and pt able to maintain standing posture in stedy for up to 15 seconds each trial. Cues for upright posture with pt able to correct and intermittently maintain throughout standing trails. Pt with good tolerance for LE exercise to increase ROM and strength. Pt verbalized importance of continued participation and mobilization to reach goals. Pt continues to benefit from skilled PT services to progress toward functional mobility goals.    Recommendations for follow up therapy are one component of a multi-disciplinary discharge planning process, led by the attending physician.  Recommendations may be updated based on patient status, additional functional criteria and insurance authorization.  Follow Up Recommendations  Skilled nursing-short term rehab (<3 hours/day)     Assistance Recommended at  Discharge Frequent or constant Supervision/Assistance  Patient can return home with the following Two people to help with walking and/or transfers;Two people to help with bathing/dressing/bathroom;Assistance with feeding   Equipment Recommendations  None recommended by PT    Recommendations for Other Services       Precautions / Restrictions Precautions Precautions: Fall;Knee Required Braces or Orthoses: Knee Immobilizer - Right Knee Immobilizer - Right: On when out of bed or walking Restrictions Weight Bearing Restrictions: Yes RLE Weight Bearing: Partial weight bearing RLE Partial Weight Bearing Percentage or Pounds: 50     Mobility  Bed Mobility Overal bed mobility: Needs Assistance Bed Mobility: Supine to Sit, Sit to Supine     Supine to sit: Min assist Sit to supine: Mod assist   General bed mobility comments: min assist to come EOB for RLE managment, mod assist to bring RLE into bed and to assist pt to center in bed    Transfers Overall transfer level: Needs assistance Equipment used: Ambulation equipment used Transfers: Sit to/from Stand Sit to Stand: Mod assist, Max assist           General transfer comment: mod-max assist to power up to standing, increased time needed prior to standing forbed mobility recovery, once standing pt with fair balance with BUE support on stedy rail.    Ambulation/Gait                   Stairs             Wheelchair Mobility    Modified Rankin (Stroke Patients Only)       Balance Overall balance assessment:  Needs assistance   Sitting balance-Leahy Scale: Fair Sitting balance - Comments: able to static sit with BUE   Standing balance support: Bilateral upper extremity supported, Reliant on assistive device for balance Standing balance-Leahy Scale: Poor                              Cognition Arousal/Alertness: Awake/alert Behavior During Therapy: WFL for tasks assessed/performed Overall  Cognitive Status: Impaired/Different from baseline Area of Impairment: Attention, Following commands, Safety/judgement                   Current Attention Level: Focused   Following Commands: Follows one step commands with increased time, Follows one step commands inconsistently Safety/Judgement: Decreased awareness of safety     General Comments: improved participation this session        Exercises General Exercises - Lower Extremity Ankle Circles/Pumps: AROM, Both, 20 reps, Supine Quad Sets: AROM, Right, 5 reps, Supine Heel Slides: AAROM, Right, Supine, AROM, 10 reps Straight Leg Raises: 10 reps, Supine, AROM, Right    General Comments        Pertinent Vitals/Pain Pain Assessment Pain Assessment: 0-10 Pain Score: 5  Pain Location: R knee Pain Descriptors / Indicators: Grimacing, Guarding, Moaning Pain Intervention(s): Limited activity within patient's tolerance, Monitored during session, Repositioned    Home Living                          Prior Function            PT Goals (current goals can now be found in the care plan section) Acute Rehab PT Goals Patient Stated Goal: to go to rehab PT Goal Formulation: With patient/family Time For Goal Achievement: 07/29/21    Frequency    Min 3X/week      PT Plan      Co-evaluation              AM-PAC PT "6 Clicks" Mobility   Outcome Measure  Help needed turning from your back to your side while in a flat bed without using bedrails?: A Lot Help needed moving from lying on your back to sitting on the side of a flat bed without using bedrails?: A Lot Help needed moving to and from a bed to a chair (including a wheelchair)?: Total Help needed standing up from a chair using your arms (e.g., wheelchair or bedside chair)?: Total Help needed to walk in hospital room?: Total Help needed climbing 3-5 steps with a railing? : Total 6 Click Score: 8    End of Session   Activity Tolerance:  Patient tolerated treatment well Patient left: in bed;with call bell/phone within reach;with bed alarm set;with family/visitor present Nurse Communication: Mobility status PT Visit Diagnosis: Difficulty in walking, not elsewhere classified (R26.2);Pain;Muscle weakness (generalized) (M62.81) Pain - Right/Left: Right Pain - part of body: Knee     Time: 1441-1515 PT Time Calculation (min) (ACUTE ONLY): 34 min  Charges:  $Gait Training: 8-22 mins $Therapeutic Exercise: 8-22 mins                     Desirre Eickhoff R. PTA Acute Rehabilitation Services Office: [place] 07/24/2021, 3:44 PM

## 2021-07-24 NOTE — Progress Notes (Addendum)
PROGRESS NOTE  Danielle [name]  DOB: May 02, 1946  PCP: Seward Carol, MD [id number]  DOA: 07/10/2021  LOS: 65 days  Hospital Day: 15  Brief narrative: Danielle [name] is a 76 y.o. 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 She presented to the ED on 2/13 for evaluation of failed antibiotic treatment for right knee recurrent septic arthritis.  -December 2019, patient underwent right knee arthroplasty by Dr. Mardelle Matte  -November 2022, patient was seen in his office for severe acute pain and swelling of the right knee.  Right knee aspiration at that time was concerning for staph infection of her prosthetic knee joint.  She underwent I&D with liner exchange and was treated postoperatively with IV antibiotics.  She did well overall and was transitioned as an outpatient from the cefazolin to suppression with Keflex.  Evidently she ran out of Keflex approximately a week ago.  Also for the past 2 to 3 weeks, patient was getting progressively worsening pain and difficulty with ambulation and hence she was admitted on 2/13.  On 2/15, she had explant right total knee arthroplasty with implantation of articulating antibiotic spacer. Postoperatively, she was monitored in ICU and transferred to [facility] next day only to note that the hemoglobin had dropped from 9.2-4.1.  She was transferred back to ICU.  She is a Jehovah witness and in the past refusing blood transfusion.  While in ICU, she was lethargic.  Her daughter gave consent for blood products and hence she got 2 units of PRBC transfusion.  Subsequently patient woke up and then declined any further transfusion. Her hemoglobin has been running in low but stable range since then.  Subjective: Patient was seen and examined this  morning.   Propped up in bed.  Not in distress.  She has not been mobile for last few days. I encouraged her to work with physical therapy.  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  Patient underwent explant right total knee arthroplasty with implantation of articulating antibiotic spacer by Dr. Zachery Dakins on 07/12/2021.    -Infectious disease was consulted.  Patient was transitioned to IV cefazolin.  PICC line has been placed. -Last day of treatment will be 08/23/21.  OPAT orders placed by ID.  Acute on chronic anemia Jehovah witness refusing blood transfusion -Baseline hemoglobin between 8 and 9.   -Postoperatively, patient had a significant drop in hemoglobin from 9.2 to 4.1 on 2/16.   -While in ICU, patient was lethargic and confused.  Consent was obtained from daughter for blood transfusion. She was transfused 2 units of PRBC in the ICU.  CT abdomen  pelvis did not show any evidence of retroperitoneal bleed. -Since 2/18, hemoglobin has been stable and gradually improving.  6 today.  She got a dose of Aranesp on 2/25.  I will repeat a dose today. Recent Labs    07/14/21 0407 07/14/21 0640 07/14/21 1439 07/15/21 4401 07/16/21 0434  07/17/21 1659 07/18/21 0244 07/20/21 0359 07/22/21 0328 07/24/21 0325  HGB 4.1* 4.1* 4.9* 5.0* 5.3* 5.3* 5.3* 5.4* 5.6* 6.0*   Left upper extremity DVT History of left breast cancer Left upper extremity lymphedema -Patient has history of breast cancer with axillary lymph nodes resection after which she has chronic left upper extremity lymphedema.  However this hospitalization, she states it is worse than usual.   -2/24, ultrasound duplex showed an acute DVT involving a single left brachial vein.   -2/27, I discussed the situation with patient's orthopedic Dr. Zachery Dakins and cardiologist Dr. Haroldine Laws.  Patient was on Eliquis prior to surgery as well for A-fib and did fine.  It was held during this hospitalization because of surgery and significant anemia.  She did not have any other source of bleeding other than intraoperative blood loss.  Hemoglobin gradually improving.  We will agree that at this time, it is more reasonable to resume the patient on Eliquis.   Uncontrolled type 2 diabetes mellitus -A1c 8.2 on 07/10/2021 -Home meds include Lantus 30 units, sliding scale insulin -Currently blood sugars controlled Semglee 35 units daily and sliding scale insulin.  Continue to monitor. -Continue Neurontin for peripheral neuropathy Recent Labs  Lab 07/23/21 1210 07/23/21 1620 07/23/21 2004 07/24/21 0733 07/24/21 1118  GLUCAP 79 122* 148* 99 112*   CAD/stents -Had a stent 3 times in the past, last in 2017. -Currently not having anginal symptoms. -Seen by cardiology on 2/14. -Continue Lipitor.  Eliquis resumed  PAF -Eliquis resumed today.  Chronic combined systolic and diastolic CHF History of hypertension -Last echo from December 2021 with EF 45 to 02%, grade 2 diastolic dysfunction, mildly elevated pulmonary artery systolic pressure -Home meds include Coreg 12.5 mg twice daily, torsemide 20 mg daily, losartan 25 mg daily and Aldactone 12.5 mg daily. -These meds were initially  held because of low blood pressure and elevated creatinine.  Gradually resumed but blood pressure is downtrending again.  Okay to continue Coreg at low-dose but stop torsemide today.  Losartan and Aldactone remain on hold.  CKD stage IIIa -Creatinine worsening last 24 hours.  Stop torsemide. Recent Labs    04/19/21 0205 04/21/21 0250 04/24/21 0229 06/22/21 1243 07/10/21 1404 07/12/21 0343 07/13/21 0834 07/14/21 0407 07/15/21 0621 07/16/21 0434 07/20/21 0359 07/22/21 0328 07/24/21 0325  BUN 30* 24* 18 35* 21 19 25* 31* 37* 32*  --   --   --   CREATININE 1.27* 1.23* 1.17* 1.49* 1.22* 1.36* 1.41* 1.59* 1.56* 1.23* 1.09* 1.11* 1.28*   Hypothyroidism -Synthroid  Morbid obesity  -Body mass index is 39.01 kg/m. Patient has been advised to make an attempt to improve diet and exercise patterns to aid in weight loss.  OSA -CPAP nightly   Mobility: PT eval postprocedure Goals of care   Code Status: Full Code    Nutritional status:  Body mass index is 39.01 kg/m.      Diet:  Diet Order             Diet Carb Modified Fluid consistency: Thin; Room service appropriate? Yes  Diet effective now  DVT prophylaxis:  SCDs Start: 07/10/21 1254 apixaban (ELIQUIS) tablet 5 mg   Antimicrobials: IV Ancef Fluid: None Family Communication: Daughter not at bedside today.   Status is: Inpatient  Continue in-hospital care because: Pending SNF. Level of care: Med-Surg   Dispo: The patient is from: Home              Anticipated d/c is to: Pending SNF.  Insurance denied.  Family to appeal.              Patient currently is not medically stable to d/c.   Difficult to place patient No     Infusions:   sodium chloride 250 mL (07/13/21 0209)    ceFAZolin (ANCEF) IV 2 g (07/24/21 0501)   lactated ringers 10 mL/hr at 07/22/21 1329    Scheduled Meds:  apixaban  5 mg Oral BID   atorvastatin  80 mg Oral QHS   carvedilol  3.125 mg Oral BID WC   Chlorhexidine  Gluconate Cloth  6 each Topical Daily   darbepoetin (ARANESP) injection - NON-DIALYSIS  150 mcg Subcutaneous Once   empagliflozin  10 mg Oral Daily   ferrous sulfate  325 mg Oral BID WC   folic acid  1 mg Oral Daily   insulin aspart  0-15 Units Subcutaneous TID WC   insulin aspart  0-5 Units Subcutaneous QHS   insulin glargine-yfgn  35 Units Subcutaneous QHS   levothyroxine  75 mcg Oral Q0600   mouth rinse  15 mL Mouth Rinse BID   multivitamin with minerals  1 tablet Oral Daily   polyethylene glycol  17 g Oral Daily   senna  1 tablet Oral BID   sodium chloride flush  10-40 mL Intracatheter Q12H   thiamine  100 mg Oral Daily    PRN meds: acetaminophen, bisacodyl, diclofenac Sodium, diphenhydrAMINE, HYDROmorphone (DILAUDID) injection, lip balm, ondansetron **OR** ondansetron (ZOFRAN) IV, oxyCODONE, sodium chloride flush   Antimicrobials: Anti-infectives (From admission, onward)    Start     Dose/Rate Route Frequency Ordered Stop   07/12/21 1916  tobramycin (NEBCIN) powder  Status:  Discontinued          As needed 07/12/21 1917 07/12/21 2124   07/12/21 1915  vancomycin (VANCOCIN) powder  Status:  Discontinued          As needed 07/12/21 1915 07/12/21 2124   07/12/21 1745  vancomycin (VANCOCIN) IVPB 1000 mg/200 mL premix        1,000 mg 200 mL/hr over 60 Minutes Intravenous  Once 07/12/21 1741 07/12/21 1759   07/12/21 1710  vancomycin (VANCOCIN) 1-5 GM/200ML-% IVPB       Note to Pharmacy: Hoy Morn E: cabinet override      07/12/21 1710 07/12/21 1742   07/10/21 1400  ceFAZolin (ANCEF) IVPB 2g/100 mL premix        2 g 200 mL/hr over 30 Minutes Intravenous Every 8 hours 07/10/21 1258         Objective: Vitals:   07/23/21 2231 07/24/21 0723  BP:  (!) 107/50  Pulse: 73 73  Resp: 16 14  Temp:  98 F (36.7 C)  SpO2: 100% 92%    Intake/Output Summary (Last 24 hours) at 07/24/2021 1337 Last data filed at 07/24/2021 0724 Gross per 24 hour  Intake --  Output 400 ml  Net  -400 ml   Filed Weights   07/22/21 0500 07/23/21 0500 07/24/21 0500  Weight: 102.3 kg 102.3 kg 103.1 kg   Weight change: 0.8 kg  Body mass index is 39.01 kg/m.   Physical Exam: General exam: Pleasant, elderly African-American female.   Skin: No rashes, lesions or ulcers. HEENT: Atraumatic, normocephalic, no obvious bleeding Lungs: Clear to auscultation bilaterally CVS: Regular rate and rhythm, no murmur GI/Abd soft, nontender, nondistended, bowel sound present CNS: Alert, awake, oriented X 3 Psychiatry: Sad affect Extremities: Left upper extremity.  Persist. Data Review: I have personally reviewed the laboratory data and studies available.  F/u labs ordered Unresulted Labs (From admission, onward)     Start     Ordered   07/12/21 0500  CBC with Differential/Platelet  Daily,   R,   Status:  Canceled      07/11/21 1504            Signed, Terrilee Croak, MD [facility] 07/24/2021

## 2021-07-24 NOTE — Anesthesia Postprocedure Evaluation (Signed)
Anesthesia Post Note  Patient: Anise Harbin  Procedure(s) Performed: EXCISIONAL TOTAL KNEE ARTHROPLASTY WITH ANTIBIOTIC SPACERS (Right: Knee)     Patient location during evaluation: PACU Anesthesia Type: General Level of consciousness: awake and patient cooperative Pain management: pain level controlled Vital Signs Assessment: post-procedure vital signs reviewed and stable Respiratory status: spontaneous breathing, nonlabored ventilation, respiratory function stable and patient connected to nasal cannula oxygen Cardiovascular status: blood pressure returned to baseline and stable Postop Assessment: no apparent nausea or vomiting Anesthetic complications: no   No notable events documented.  Last Vitals:  Vitals:   07/23/21 2045 07/23/21 2231  BP:    Pulse:  73  Resp:  16  Temp:    SpO2: 92% 100%    Last Pain:  Vitals:   07/24/21 0530  TempSrc:   PainSc: Asleep                 Acie Custis

## 2021-07-24 NOTE — Plan of Care (Signed)

## 2021-07-24 NOTE — TOC Progression Note (Addendum)
Transition of Care [facility]) - Progression Note    Patient Details  Name: Taleisha Kaczynski MRN: [id number] Date of Birth: Nov 16, 1945  Transition of Care [facility]) CM/SW Contact  Joanne Chars, [place] Phone Number: 07/24/2021, 8:44 AM  Clinical Narrative:   CSW had message from Friday 438pm that the SNF auth for this pt went to peer to peer due by noon on Monday.  [id number] option 3.  Appeal number is [phone] option 3. MD notified.    1300: MD not planning to complete peer to peer.  Pt has not been participating in PT.  CSW met with pt and with daughter Varney Biles in room regarding SNF not being approved, discussed pt lack of participation in PT.  They do not have options to pay out of pocket. Daughter upset, not sure what other options she has. PT planning to work with pt this afternoon and pt encouraged to give her best efforts.  Shirlee Limerick at [facility] updated.    1520: Per PT, pt did have much better effort in the session.  CSW met with pt and daughter again.  Daughter is planning to talk with her sisters who are in [place] about plan if pt cannot go to SNF.      Expected Discharge Plan: [place] Barriers to Discharge: Continued Medical Work up, SNF Pending bed offer  Expected Discharge Plan and Services Expected Discharge Plan: [place] In-house Referral: Clinical Social Work   Post Acute Care Choice: [place] Living arrangements for the past 2 months: Apartment                                       Social Determinants of Health (SDOH) Interventions    Readmission Risk Interventions No flowsheet data found.

## 2021-07-25 ENCOUNTER — Ambulatory Visit: Payer: Medicare HMO | Admitting: Internal Medicine

## 2021-07-25 DIAGNOSIS — T8453XA Infection and inflammatory reaction due to internal right knee prosthesis, initial encounter: Secondary | ICD-10-CM | POA: Diagnosis not present

## 2021-07-25 LAB — GLUCOSE, CAPILLARY
Glucose-Capillary: 97 mg/dL (ref 70–99)
Glucose-Capillary: 94 mg/dL (ref 70–99)
Glucose-Capillary: 140 mg/dL — ABNORMAL HIGH (ref 70–99)
Glucose-Capillary: 124 mg/dL — ABNORMAL HIGH (ref 70–99)
Glucose-Capillary: 148 mg/dL — ABNORMAL HIGH (ref 70–99)

## 2021-07-25 NOTE — TOC Progression Note (Signed)
Transition of Care [facility]) - Progression Note    Patient Details  Name: Danielle [name] MRN: [id number] Date of Birth: 08-Feb-1946  Transition of Care [facility]) CM/SW Contact  Joanne Chars, LCSW Phone Number: 07/25/2021, 11:09 AM  Clinical Narrative:  Daughter does want to appeal SNF decision.  Per Cristy Folks, MD does appeal, MD did not think so.  CSW called appeal number: [id number] option 3, they said MD or pt/family could appeal.  CSW contacted [facility] and asked her to do this ASAP.     Expected Discharge Plan: [place] Barriers to Discharge: Continued Medical Work up, SNF Pending bed offer  Expected Discharge Plan and Services Expected Discharge Plan: [place] In-house Referral: Clinical Social Work   Post Acute Care Choice: [place] Living arrangements for the past 2 months: Apartment                                       Social Determinants of Health (SDOH) Interventions    Readmission Risk Interventions No flowsheet data found.

## 2021-07-25 NOTE — Plan of Care (Signed)

## 2021-07-25 NOTE — Progress Notes (Signed)
PROGRESS NOTE  Danielle [name]  DOB: 11-Jul-1945  PCP: Seward Carol, MD OVZ:[id number]  DOA: 07/10/2021  LOS: 27 days  Hospital Day: 57  Brief narrative: Danielle [name] is a 76 y.o. 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 She presented to the ED on 2/13 for evaluation of failed antibiotic treatment for right knee recurrent septic arthritis.  -December 2019, patient underwent right knee arthroplasty by Dr. Mardelle Matte  -November 2022, patient was seen in his office for severe acute pain and swelling of the right knee.  Right knee aspiration at that time was concerning for staph infection of her prosthetic knee joint.  She underwent I&D with liner exchange and was treated postoperatively with IV antibiotics.  She did well overall and was transitioned as an outpatient from the cefazolin to suppression with Keflex.  Evidently she ran out of Keflex approximately a week ago.  Also for the past 2 to 3 weeks, patient was getting progressively worsening pain and difficulty with ambulation and hence she was admitted on 2/13.  On 2/15, she had explant right total knee arthroplasty with implantation of articulating antibiotic spacer. Postoperatively, she was monitored in ICU and transferred to [facility] next day only to note that the hemoglobin had dropped from 9.2-4.1.  She was transferred back to ICU.  She is a Jehovah witness and in the past refusing blood transfusion.  While in ICU, she was lethargic.  Her daughter gave consent for blood products and hence she got 2 units of PRBC transfusion.  Subsequently patient woke up and then declined any further transfusion. Her hemoglobin has been running in low but stable range since then.  Subjective: Patient was seen and examined this  morning.   Comfortable in bed.  Not in distress.  No new symptoms.  Pending appeal decision for SNF.  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  Patient underwent explant right total knee arthroplasty with implantation of articulating antibiotic spacer by Dr. Zachery Dakins on 07/12/2021.    -Infectious disease was consulted.  Patient was transitioned to IV cefazolin.  PICC line has been placed. -Last day of treatment will be 08/23/21.  OPAT orders placed by ID.  Acute on chronic anemia Jehovah witness refusing blood transfusion -Baseline hemoglobin between 8 and 9.   -Postoperatively, patient had a significant drop in hemoglobin from 9.2 to 4.1 on 2/16.   -While in ICU, patient was lethargic and confused.  Consent was obtained from daughter for blood transfusion. She was transfused 2 units of PRBC in the ICU.  CT abdomen pelvis did not show any evidence of retroperitoneal bleed. -  Since 2/18, hemoglobin has been stable and gradually improving.  6 on last check on 2/27. She got a dose of Aranesp on 2/25.  Per pharmacy, next dose can be given only on 07/28/2021. Recent Labs    07/14/21 0407 07/14/21 0640 07/14/21 1439 07/15/21 5009 07/16/21 0434  07/17/21 1659 07/18/21 0244 07/20/21 0359 07/22/21 0328 07/24/21 0325  HGB 4.1* 4.1* 4.9* 5.0* 5.3* 5.3* 5.3* 5.4* 5.6* 6.0*    Left upper extremity DVT History of left breast cancer Left upper extremity lymphedema -Patient has history of breast cancer with axillary lymph nodes resection after which she has chronic left upper extremity lymphedema.  However this hospitalization, she states it is worse than usual.   -2/24, ultrasound duplex showed an acute DVT involving a single left brachial vein.   -2/27, I discussed the situation with patient's orthopedic Dr. Zachery Dakins and cardiologist Dr. Haroldine Laws.  Patient was on Eliquis prior to surgery as well for A-fib and did fine.  It was held during this hospitalization because of surgery and significant anemia.  She did not have any other source of bleeding other than intraoperative blood loss.  Hemoglobin gradually improving.  We all agreed that at this time, it is more reasonable to resume the patient on Eliquis.  Eliquis resumed. -Compression bandages on for left upper extremity lymphedema.  Uncontrolled type 2 diabetes mellitus -A1c 8.2 on 07/10/2021 -Home meds include Lantus 30 units, sliding scale insulin -Currently blood sugars controlled Semglee 35 units daily and sliding scale insulin.  Continue to monitor. -Continue Neurontin for peripheral neuropathy Recent Labs  Lab 07/24/21 1552 07/24/21 2146 07/25/21 0549 07/25/21 0855 07/25/21 1105  GLUCAP 119* 112* 97 94 140*    CAD/stents -Had a stent 3 times in the past, last in 2017. -Currently not having anginal symptoms. -Seen by cardiology on 2/14. -Continue Lipitor.  Eliquis resumed  PAF -Eliquis resumed  Chronic combined systolic and diastolic CHF History of hypertension -Last echo from December 2021 with EF 45 to 38%, grade 2 diastolic dysfunction, mildly elevated pulmonary artery systolic pressure -Home meds include Coreg 12.5 mg twice daily, torsemide 20 mg daily,  losartan 25 mg daily and Aldactone 12.5 mg daily. -These meds were initially held because of low blood pressure and elevated creatinine.  Once I resumed torsemide, blood pressure dropped.  So it was held again.  Okay to continue low-dose Coreg.  Losartan and Aldactone remain on hold.  CKD stage IIIa -Creatinine slightly up on last check on 2/27.  Continue to monitor.  Torsemide, losartan and Aldactone on hold. Recent Labs    04/19/21 0205 04/21/21 0250 04/24/21 0229 06/22/21 1243 07/10/21 1404 07/12/21 0343 07/13/21 0834 07/14/21 0407 07/15/21 0621 07/16/21 0434 07/20/21 0359 07/22/21 0328 07/24/21 0325  BUN 30* 24* 18 35* 21 19 25* 31* 37* 32*  --   --   --   CREATININE 1.27* 1.23* 1.17* 1.49* 1.22* 1.36* 1.41* 1.59* 1.56* 1.23* 1.09* 1.11* 1.28*    Hypothyroidism -Synthroid  Morbid obesity  -Body mass index is 39.01 kg/m. Patient has been advised to make an attempt to improve diet and exercise patterns to aid in weight loss.  OSA -CPAP nightly   Mobility: PT eval postprocedure Goals of care   Code Status: Full Code    Nutritional status:  Body mass index is 39.01 kg/m.      Diet:  Diet Order             Diet Carb Modified Fluid consistency: Thin; Room service appropriate? Yes  Diet effective now                   DVT prophylaxis:  SCDs Start: 07/10/21 1254 apixaban (ELIQUIS) tablet 5 mg   Antimicrobials: IV Ancef Fluid: None Family Communication: Daughter not at bedside today.   Status is: Inpatient  Continue in-hospital care because: Pending SNF. Level of care: Med-Surg   Dispo: The patient is from: Home              Anticipated d/c is to: Pending SNF.  Insurance denied.  Family appealed on 2/27              Patient currently is not medically stable to d/c.   Difficult to place patient No     Infusions:   sodium chloride 250 mL (07/13/21 0209)    ceFAZolin (ANCEF) IV 2 g (07/25/21 0543)   lactated ringers 10 mL/hr at 07/22/21 1329     Scheduled Meds:  apixaban  5 mg Oral BID   atorvastatin  80 mg Oral QHS   carvedilol  3.125 mg Oral BID WC   Chlorhexidine Gluconate Cloth  6 each Topical Daily   [START ON 07/28/2021] darbepoetin (ARANESP) injection - NON-DIALYSIS  150 mcg Subcutaneous Once   empagliflozin  10 mg Oral Daily   ferrous sulfate  325 mg Oral BID WC   folic acid  1 mg Oral Daily   insulin aspart  0-15 Units Subcutaneous TID WC   insulin aspart  0-5 Units Subcutaneous QHS   insulin glargine-yfgn  35 Units Subcutaneous QHS   levothyroxine  75 mcg Oral Q0600   mouth rinse  15 mL Mouth Rinse BID   multivitamin with minerals  1 tablet Oral Daily   polyethylene glycol  17 g Oral Daily   senna  1 tablet Oral BID   sodium chloride flush  10-40 mL Intracatheter Q12H   thiamine  100 mg Oral Daily    PRN meds: acetaminophen, bisacodyl, diclofenac Sodium, diphenhydrAMINE, HYDROmorphone (DILAUDID) injection, lip balm, ondansetron **OR** ondansetron (ZOFRAN) IV, oxyCODONE, sodium chloride flush   Antimicrobials: Anti-infectives (From admission, onward)    Start     Dose/Rate Route Frequency Ordered Stop   07/12/21 1916  tobramycin (NEBCIN) powder  Status:  Discontinued          As needed 07/12/21 1917 07/12/21 2124   07/12/21 1915  vancomycin (VANCOCIN) powder  Status:  Discontinued          As needed 07/12/21 1915 07/12/21 2124   07/12/21 1745  vancomycin (VANCOCIN) IVPB 1000 mg/200 mL premix        1,000 mg 200 mL/hr over 60 Minutes Intravenous  Once 07/12/21 1741 07/12/21 1759   07/12/21 1710  vancomycin (VANCOCIN) 1-5 GM/200ML-% IVPB       Note to Pharmacy: Hoy Morn E: cabinet override      07/12/21 1710 07/12/21 1742   07/10/21 1400  ceFAZolin (ANCEF) IVPB 2g/100 mL premix        2 g 200 mL/hr over 30 Minutes Intravenous Every 8 hours 07/10/21 1258         Objective: Vitals:   07/25/21 0851 07/25/21 1311  BP:    Pulse:  81  Resp:    Temp: 98.7 F (37.1 C) 98.6 F (37 C)  SpO2: 99%  100%    Intake/Output Summary (Last 24 hours) at 07/25/2021 1422 Last data filed at 07/24/2021 2206 Gross per 24 hour  Intake --  Output 400 ml  Net -400  ml    Filed Weights   07/22/21 0500 07/23/21 0500 07/24/21 0500  Weight: 102.3 kg 102.3 kg 103.1 kg   Weight change:  Body mass index is 39.01 kg/m.   Physical Exam: General exam: Pleasant, elderly African-American female.   Skin: No rashes, lesions or ulcers. HEENT: Atraumatic, normocephalic, no obvious bleeding Lungs: Clear to auscultation bilaterally CVS: Regular rate and rhythm, no murmur GI/Abd soft, nontender, nondistended, bowel sound present CNS: Alert, awake, oriented X 3 Psychiatry: Sad affect Extremities: Left upper extremity lymphedema persist.  Compression bandage on. Data Review: I have personally reviewed the laboratory data and studies available.  F/u labs ordered Unresulted Labs (From admission, onward)     Start     Ordered   07/26/21 0500  Hemoglobin and hematocrit, blood  Tomorrow morning,   R       Question:  Specimen collection method  Answer:  IV Team=IV Team collect   07/25/21 0828   07/26/21 0500  Creatinine, serum  Tomorrow morning,   R       Question:  Specimen collection method  Answer:  IV Team=IV Team collect   07/25/21 0828   07/12/21 0500  CBC with Differential/Platelet  Daily,   R,   Status:  Canceled      07/11/21 1504            Signed, Terrilee Croak, MD [facility] 07/25/2021

## 2021-07-26 DIAGNOSIS — T8453XA Infection and inflammatory reaction due to internal right knee prosthesis, initial encounter: Secondary | ICD-10-CM | POA: Diagnosis not present

## 2021-07-26 LAB — GLUCOSE, CAPILLARY
Glucose-Capillary: 98 mg/dL (ref 70–99)
Glucose-Capillary: 121 mg/dL — ABNORMAL HIGH (ref 70–99)
Glucose-Capillary: 148 mg/dL — ABNORMAL HIGH (ref 70–99)
Glucose-Capillary: 103 mg/dL — ABNORMAL HIGH (ref 70–99)

## 2021-07-26 LAB — HEMOGLOBIN AND HEMATOCRIT, BLOOD
HCT: 23.1 % — ABNORMAL LOW (ref 36.0–46.0)
Hemoglobin: 6.9 g/dL — CL (ref 12.0–15.0)

## 2021-07-26 LAB — CREATININE, SERUM
Creatinine, Ser: 1.11 mg/dL — ABNORMAL HIGH (ref 0.44–1.00)
GFR, Estimated: 52 mL/min — ABNORMAL LOW (ref >60)

## 2021-07-26 MED ORDER — TORSEMIDE 20 MG PO TABS
20.0000 mg | ORAL_TABLET | ORAL | Status: DC
  Administered 2021-07-26 – 2021-08-01 (×4): 20 mg via ORAL
  Filled 2021-07-26 (×4): qty 1

## 2021-07-26 NOTE — Progress Notes (Signed)
Physical Therapy Treatment ?Patient Details ?Name: Danielle [name] ?MRN: [id number] ?DOB: 01-Dec-1945 ?Today's Date: 07/26/2021 ? ? ?History of Present Illness Patient is a 76 y/o female admitted due to R TKA infection.  Initially had TKA 04/2018 then had pain/swelling 03/2021 with I&D and liner exchange and IV antibotics.  Admitted with worse pain and difficulty ambulating with fevers.  S/p R explant TKA w/implantation of articulating antibiotic spacer on 07/12/2021.  Post op had severe anemia with transfer to ICU due to [facility] witness and refusing blood products.  Did finally have transfusion 2/17 with hemoglobin at 5.0 after transfusion.  PMH positive for HFpEF, CAD w/ multiple prior PCIs, SSS s/p PPM, Afib on AC, OSA on CPAP, CKD, HTN, DM. ? ?  ?PT Comments  ? ? Pt received supine and agreeable to session with encouragement with good progress towards goals.  Pt needing grossly min assist for bed mobility, transfers and ambulation with cues needed for safe hand placement and physical assist only to elevate trunk to sitting. Pt with good recall of LE sequencing during ambulation and compliance with WB precautions from EOB to chair. Education reiterated re; RLE positioning to encourage extension, continued mobility and WB precautions, pt verbalized understanding. Pt continues to benefit from skilled PT services to progress toward functional mobility goals.  ?  ?Recommendations for follow up therapy are one component of a multi-disciplinary discharge planning process, led by the attending physician.  Recommendations may be updated based on patient status, additional functional criteria and insurance authorization. ? ?Follow Up Recommendations ? Skilled nursing-short term rehab (<3 hours/day) ?  ?  ?Assistance Recommended at Discharge Frequent or constant Supervision/Assistance  ?Patient can return home with the following Two people to help with walking and/or transfers;Two people to help with  bathing/dressing/bathroom;Assistance with feeding ?  ?Equipment Recommendations ? None recommended by PT  ?  ?Recommendations for Other Services   ? ? ?  ?Precautions / Restrictions Precautions ?Precautions: Fall;Knee ?Required Braces or Orthoses: Knee Immobilizer - Right ?Knee Immobilizer - Right: On when out of bed or walking ?Restrictions ?Weight Bearing Restrictions: Yes ?RLE Weight Bearing: Partial weight bearing ?RLE Partial Weight Bearing Percentage or Pounds: 50  ?  ? ?Mobility ? Bed Mobility ?Overal bed mobility: Needs Assistance ?Bed Mobility: Supine to Sit ?  ?  ?Supine to sit: Min assist ?  ?  ?General bed mobility comments: min assist to bring trunk upright ?  ? ?Transfers ?Overall transfer level: Needs assistance ?Equipment used: Rolling walker (2 wheels) ?Transfers: Sit to/from Stand ?Sit to Stand: Min assist, From elevated surface ?  ?  ?  ?  ?  ?General transfer comment: min assist to come to standing from elevated EOB,cues for safe hand placement ?  ? ?Ambulation/Gait ?Ambulation/Gait assistance: Min guard, Min assist ?Gait Distance (Feet): 5 Feet ?Assistive device: Rolling walker (2 wheels) ?Gait Pattern/deviations: Step-through pattern, Decreased step length - right, Decreased step length - left, Trunk flexed ?Gait velocity: decr ?  ?  ?General Gait Details: slow steady gait min assist for cueing only. ? ? ?Stairs ?  ?  ?  ?  ?  ? ? ?Wheelchair Mobility ?  ? ?Modified Rankin (Stroke Patients Only) ?  ? ? ?  ?Balance Overall balance assessment: Needs assistance ?  ?Sitting balance-Leahy Scale: Fair ?Sitting balance - Comments: able to static sit with BUE ?  ?Standing balance support: Bilateral upper extremity supported, Reliant on assistive device for balance ?Standing balance-Leahy Scale: Poor ?Standing balance comment: reliant on BUE support  on RW ?  ?  ?  ?  ?  ?  ?  ?  ?  ?  ?  ?  ? ?  ?Cognition Arousal/Alertness: Awake/alert ?Behavior During Therapy: [facility] for tasks assessed/performed ?Overall  Cognitive Status: Impaired/Different from baseline ?Area of Impairment: Attention, Following commands, Safety/judgement ?  ?  ?  ?  ?  ?  ?  ?  ?  ?Current Attention Level: Focused ?  ?Following Commands: Follows one step commands with increased time, Follows one step commands inconsistently ?Safety/Judgement: Decreased awareness of safety ?  ?  ?  ?  ?  ? ?  ?Exercises   ? ?  ?General Comments   ?  ?  ? ?Pertinent Vitals/Pain Pain Assessment ?Pain Assessment: Faces ?Faces Pain Scale: Hurts little more ?Pain Location: R knee ?Pain Descriptors / Indicators: Grimacing, Guarding, Moaning ?Pain Intervention(s): Limited activity within patient's tolerance, Monitored during session, Repositioned  ? ? ?Home Living   ?  ?  ?  ?  ?  ?  ?  ?  ?  ?   ?  ?Prior Function    ?  ?  ?   ? ?PT Goals (current goals can now be found in the care plan section) Acute Rehab PT Goals ?Patient Stated Goal: to go to rehab ?PT Goal Formulation: With patient/family ?Time For Goal Achievement: 07/29/21 ? ?  ?Frequency ? ? ? Min 3X/week ? ? ? ?  ?PT Plan    ? ? ?Co-evaluation   ?  ?  ?  ?  ? ?  ?AM-PAC PT "6 Clicks" Mobility   ?Outcome Measure ? Help needed turning from your back to your side while in a flat bed without using bedrails?: A Little ?Help needed moving from lying on your back to sitting on the side of a flat bed without using bedrails?: A Little ?Help needed moving to and from a bed to a chair (including a wheelchair)?: A Lot ?Help needed standing up from a chair using your arms (e.g., wheelchair or bedside chair)?: A Little ?Help needed to walk in hospital room?: A Little ?Help needed climbing 3-5 steps with a railing? : Total ?6 Click Score: 15 ? ?  ?End of Session Equipment Utilized During Treatment: Gait belt;Right knee immobilizer ?Activity Tolerance: Patient tolerated treatment well ?Patient left: in chair;with call bell/phone within reach ?Nurse Communication: Mobility status ?PT Visit Diagnosis: Difficulty in walking, not  elsewhere classified (R26.2);Pain;Muscle weakness (generalized) (M62.81) ?Pain - Right/Left: Right ?Pain - part of body: Knee ?  ? ? ?Time: 4356-8616 ?PT Time Calculation (min) (ACUTE ONLY): 30 min ? ?Charges:  $Gait Training: 8-22 mins ?$Therapeutic Activity: 8-22 mins          ?          ? ?Audry Riles. PTA ?Acute Rehabilitation Services ?Office: [phone] ? ? ? ?Betsey Holiday Kellyn Mccary ?07/26/2021, 12:20 PM ? ?

## 2021-07-26 NOTE — Progress Notes (Addendum)
Mobility Specialist Progress Note  ? ? 07/26/21 1452  ?Mobility  ?Bed Position Chair  ?Activity Ambulated with assistance in room  ?Level of Assistance Moderate assist, patient does 50-74%  ?Assistive Device Front wheel walker  ?RLE Weight Bearing PWB  ?Distance Ambulated (ft) 10 ft ?(6+4)  ?Activity Response Tolerated fair  ?$Mobility charge 1 Mobility  ? ?Pt received in chair. Took x1 seated rest break for ~20 minutes and was emotional about frustration over her third surgery, LOS, and pain management. Then proceeded for second bout. Rolled back to starting spot. Left with call bell in reach.  ? ?Danielle [name] ?Mobility Specialist  ?M.S. 5N: [phone]  ?

## 2021-07-26 NOTE — Progress Notes (Signed)
? ? ? ?Subjective: ?14 Days Post-Op s/p Procedure(s): ?EXCISIONAL TOTAL KNEE ARTHROPLASTY WITH ANTIBIOTIC SPACERS ?Doing well on the floor. Knee pain well controlled. Denies distal n/t. Pain well controlled. Swelling in the left arm improving. Hgb trending up now to 6.9. ? ? ?Objective:  ?PE: ?VITALS:   ?Vitals:  ? 07/25/21 1311 07/25/21 2000 07/26/21 0543 07/26/21 0719  ?BP:  128/60 134/64 (!) 127/52  ?Pulse: 81   75  ?Resp:  11  20  ?Temp: 98.6 ?F (37 ?C)  98.4 ?F (36.9 ?C) 98.4 ?F (36.9 ?C)  ?TempSrc: Oral  Oral Oral  ?SpO2: 100%  97% 98%  ?Weight:      ?Height:      ? ?Neurovascular intact ?Sensation intact distally ?Dorsiflexion/Plantar flexion intact ?Incision: aquacell dressing c/d/I. Tolerated knee ROM 0-95 this morning. ?Compartment soft ?Swelling of the left ankle ?Endorses distal sensation ? ?LABS ? ?Results for orders placed or performed during the hospital encounter of 07/10/21 (from the past 24 hour(s))  ?Glucose, capillary     Status: Abnormal  ? Collection Time: 07/25/21 11:05 AM  ?Result Value Ref Range  ? Glucose-Capillary 140 (H) 70 - 99 mg/dL  ?Glucose, capillary     Status: Abnormal  ? Collection Time: 07/25/21  4:18 PM  ?Result Value Ref Range  ? Glucose-Capillary 124 (H) 70 - 99 mg/dL  ?Glucose, capillary     Status: Abnormal  ? Collection Time: 07/25/21  7:52 PM  ?Result Value Ref Range  ? Glucose-Capillary 148 (H) 70 - 99 mg/dL  ?Hemoglobin and hematocrit, blood     Status: Abnormal  ? Collection Time: 07/26/21  3:54 AM  ?Result Value Ref Range  ? Hemoglobin 6.9 (LL) 12.0 - 15.0 g/dL  ? HCT 23.1 (L) 36.0 - 46.0 %  ?Creatinine, serum     Status: Abnormal  ? Collection Time: 07/26/21  3:54 AM  ?Result Value Ref Range  ? Creatinine, Ser 1.11 (H) 0.44 - 1.00 mg/dL  ? GFR, Estimated 52 (L) >60 mL/min  ?Glucose, capillary     Status: None  ? Collection Time: 07/26/21  8:01 AM  ?Result Value Ref Range  ? Glucose-Capillary 98 70 - 99 mg/dL  ? ? ?No results found. ? ?Assessment/Plan: ?Principal  Problem: ?  Septic Arthritis, Infection of prosthetic right knee joint ([place]), MSSA infection ?Active Problems: ?  Essential hypertension ?  Hypothyroidism ?  Obesity (BMI 30-39.9) ?  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  Type 2 diabetes mellitus with diabetic polyneuropathy, with long-term current use of insulin ([place]) ?  Refusal of blood transfusions as patient is Jehovah's Witness ?  Paroxysmal atrial fibrillation (HCC) ?  CKD (chronic kidney disease), stage III ([place]) ?  Complication of internal right knee prosthesis ([place]) ?  Anemia ?  Acute blood loss anemia ? ?S/p R TKA explant for prosthetic joint infection with placement of antibiotic articulating knee spacer 2/15 ? ?14 Days Post-Op s/p Procedure(s): ?EXCISIONAL TOTAL KNEE ARTHROPLASTY WITH ANTIBIOTIC SPACERS ? ?Severe post op blood loss anemia.  Hemoglobin improving 6.9 ? ?Patient refusing blood products at this time as she is a Sales promotion account executive witness. Daughter overridden no blood order on 2/17 and she received 2 units PRBC for Hbg of 4.1. ? ?Post op recs: ?WB: 50% PWB, knee immobilizer when walking.  Can be removed when in bed or working with physical therapy ?Dressing: Prevana removed 2/22. Aquacell dressing applied to be left on for additional 2 weeks until suture removal. ?Abx: continue ancef per infectious disease  recommendations, follow-up Intra-Op cultures: MSSA. Aspiration results from clinic aspiration on 07/10/21 growing MSSA. ?Imaging: PACU xrays ?DVT prophylaxis: per medicine team, plan to resume full dose eliquis at this point to address LUE DVT and a. Fib. ?Follow up: 2-3 week after surgery for a wound check with Dr. Zachery Dakins at [facility].  ?Address: [address] [place], [place], [place] [address]  ?Office Phone: [phone] ? ? ?Contact information:   ?After hours and holidays please check [email or web address] for group call information for [facility] ? ?Johnie Stadel A Destenee Guerry ?07/26/2021, 10:39 AM  ?

## 2021-07-26 NOTE — Progress Notes (Signed)
Mobility Specialist Progress Note  ? ? 07/26/21 1604  ?Mobility  ?Activity Transferred from chair to bed  ?Level of Assistance Moderate assist, patient does 50-74%  ?Assistive Device Front wheel walker  ?RLE Weight Bearing PWB  ?Distance Ambulated (ft) 4 ft  ?Activity Response Tolerated fair  ?$Mobility charge 1 Mobility  ? ?Pt left sitting EOB with NT present.  ? ?Hildred Alamin ?Mobility Specialist  ?M.S. 5N: [phone]  ?

## 2021-07-26 NOTE — Progress Notes (Signed)
PROGRESS NOTE  Danielle [name]  DOB: July 26, 1945  PCP: Seward Carol, MD GNF:[id number]  DOA: 07/10/2021  LOS: 23 days  Hospital Day: 17  Brief narrative: Danielle [name] is a 76 y.o. 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 She presented to the ED on 2/13 for evaluation of failed antibiotic treatment for right knee recurrent septic arthritis.  -December 2019, patient underwent right knee arthroplasty by Dr. Mardelle Matte  -November 2022, patient was seen in his office for severe acute pain and swelling of the right knee.  Right knee aspiration at that time was concerning for staph infection of her prosthetic knee joint.  She underwent I&D with liner exchange and was treated postoperatively with IV antibiotics.  She did well overall and was transitioned as an outpatient from the cefazolin to suppression with Keflex.  Evidently she ran out of Keflex approximately a week ago.  Also for the past 2 to 3 weeks, patient was getting progressively worsening pain and difficulty with ambulation and hence she was admitted on 2/13.  On 2/15, she had explant right total knee arthroplasty with implantation of articulating antibiotic spacer. Postoperatively, she was monitored in ICU and transferred to [facility] next day only to note that the hemoglobin had dropped from 9.2-4.1.  She was transferred back to ICU.  She is a Jehovah witness and in the past refusing blood transfusion.  While in ICU, she was lethargic.  Her daughter gave consent for blood products and hence she got 2 units of PRBC transfusion.  Subsequently patient woke up and then declined any further transfusion. Her hemoglobin has been running in low but stable range since then.  Subjective: Patient was seen and examined this  morning.   Propped up in bed.  Not in distress.  No new symptoms. Frustrated because a long stay Pending appeal decision for SNF.  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  Patient underwent explant right total knee arthroplasty with implantation of articulating antibiotic spacer by Dr. Zachery Dakins on 07/12/2021.    -Infectious disease was consulted.  Patient was transitioned to IV cefazolin.  PICC line has been placed. -Last day of treatment will be 08/23/21.  OPAT orders placed by ID.  Acute on chronic anemia Jehovah witness refusing blood transfusion -Baseline hemoglobin between 8 and 9.   -Postoperatively, patient had a significant drop in hemoglobin from 9.2 to 4.1 on 2/16.   -While in ICU, patient was lethargic and confused.  Consent was obtained from daughter for blood transfusion. She was transfused 2 units of PRBC in the ICU.  CT abdomen pelvis did not show  any evidence of retroperitoneal bleed. -Since 2/18, hemoglobin has been stable and gradually improving.  6.9 on last check on 2/27. She got a dose of Aranesp on 2/25.  Per pharmacy, next dose can be given only on 07/28/2021. Recent Labs    07/14/21 0640 07/14/21 1439 07/15/21 0621  07/16/21 0434 07/17/21 1659 07/18/21 0244 07/20/21 0359 07/22/21 0328 07/24/21 0325 07/26/21 0354  HGB 4.1* 4.9* 5.0* 5.3* 5.3* 5.3* 5.4* 5.6* 6.0* 6.9*    Left upper extremity DVT History of left breast cancer Left upper extremity lymphedema -Patient has history of breast cancer with axillary lymph nodes resection after which she has chronic left upper extremity lymphedema.  However this hospitalization, she states it is worse than usual.   -2/24, ultrasound duplex showed an acute DVT involving a single left brachial vein.   -2/27, I discussed the situation with patient's orthopedic Dr. Zachery Dakins and cardiologist Dr. Haroldine Laws.  Patient was on Eliquis prior to surgery as well for A-fib and did fine.  It was held during this hospitalization because of surgery and significant anemia.  She did not have any other source of bleeding other than intraoperative blood loss.  Hemoglobin gradually improving.  We all agreed that at this time, it is more reasonable to resume the patient on Eliquis.  Eliquis resumed. -Compression bandages on for left upper extremity lymphedema.  Uncontrolled type 2 diabetes mellitus -A1c 8.2 on 07/10/2021 -Home meds include Lantus 30 units, sliding scale insulin -Currently blood sugars controlled Semglee 35 units daily and sliding scale insulin.  Continue to monitor. -Continue Neurontin for peripheral neuropathy Recent Labs  Lab 07/25/21 1105 07/25/21 1618 07/25/21 1952 07/26/21 0801 07/26/21 1126  GLUCAP 140* 124* 148* 98 121*    CAD/stents -Had a stent 3 times in the past, last in 2017. -Currently not having anginal symptoms. -Seen by cardiology on 2/14. -Continue Lipitor.  Eliquis resumed  PAF -Eliquis resumed  Chronic combined systolic and diastolic CHF History of hypertension -Last echo from December 2021 with EF 45 to 73%, grade 2 diastolic dysfunction, mildly elevated pulmonary artery systolic pressure -Home meds include Coreg 12.5 mg twice  daily, torsemide 20 mg daily, losartan 25 mg daily and Aldactone 12.5 mg daily. -These meds were initially held because of low blood pressure and elevated creatinine.   -Blood pressure stable.  Resume torsemide every other day.  Continue Coreg. Losartan and Aldactone remain on hold.  Continue to monitor heart rate and blood pressure.  CKD stage IIIa -Creatinine remains at baseline. Continue to monitor.  Torsemide, losartan and Aldactone on hold. Recent Labs    04/19/21 0205 04/21/21 0250 04/24/21 0229 06/22/21 1243 07/10/21 1404 07/12/21 0343 07/13/21 0834 07/14/21 0407 07/15/21 0621 07/16/21 0434 07/20/21 0359 07/22/21 0328 07/24/21 0325 07/26/21 0354  BUN 30* 24* 18 35* 21 19 25* 31* 37* 32*  --   --   --   --   CREATININE 1.27* 1.23* 1.17* 1.49* 1.22* 1.36* 1.41* 1.59* 1.56* 1.23* 1.09* 1.11* 1.28* 1.11*    Hypothyroidism -Synthroid  Morbid obesity  -Body mass index is 39.01 kg/m. Patient has been advised to make an attempt to improve diet and exercise patterns to aid in weight loss.  OSA -CPAP nightly   Mobility: PT eval postprocedure Goals of care   Code Status: Full Code    Nutritional status:  Body mass index is 39.01 kg/m.      Diet:  Diet Order             Diet Carb  Modified Fluid consistency: Thin; Room service appropriate? Yes  Diet effective now                   DVT prophylaxis:  SCDs Start: 07/10/21 1254 apixaban (ELIQUIS) tablet 5 mg   Antimicrobials: IV Ancef Fluid: None Family Communication: Daughter not at bedside today.   Status is: Inpatient  Continue in-hospital care because: Pending SNF. Level of care: Med-Surg   Dispo: The patient is from: Home              Anticipated d/c is to: Pending SNF.  Insurance denied.  Family appealed on 2/27              Patient currently is not medically stable to d/c.   Difficult to place patient No     Infusions:   sodium chloride 250 mL (07/13/21 0209)    ceFAZolin (ANCEF) IV 2 g  (07/26/21 1310)   lactated ringers 10 mL/hr at 07/22/21 1329    Scheduled Meds:  apixaban  5 mg Oral BID   atorvastatin  80 mg Oral QHS   carvedilol  3.125 mg Oral BID WC   Chlorhexidine Gluconate Cloth  6 each Topical Daily   [START ON 07/28/2021] darbepoetin (ARANESP) injection - NON-DIALYSIS  150 mcg Subcutaneous Once   empagliflozin  10 mg Oral Daily   ferrous sulfate  325 mg Oral BID WC   folic acid  1 mg Oral Daily   insulin aspart  0-15 Units Subcutaneous TID WC   insulin aspart  0-5 Units Subcutaneous QHS   insulin glargine-yfgn  35 Units Subcutaneous QHS   levothyroxine  75 mcg Oral Q0600   mouth rinse  15 mL Mouth Rinse BID   multivitamin with minerals  1 tablet Oral Daily   polyethylene glycol  17 g Oral Daily   senna  1 tablet Oral BID   sodium chloride flush  10-40 mL Intracatheter Q12H   thiamine  100 mg Oral Daily   torsemide  20 mg Oral QODAY    PRN meds: acetaminophen, bisacodyl, diclofenac Sodium, diphenhydrAMINE, HYDROmorphone (DILAUDID) injection, lip balm, ondansetron **OR** ondansetron (ZOFRAN) IV, oxyCODONE, sodium chloride flush   Antimicrobials: Anti-infectives (From admission, onward)    Start     Dose/Rate Route Frequency Ordered Stop   07/12/21 1916  tobramycin (NEBCIN) powder  Status:  Discontinued          As needed 07/12/21 1917 07/12/21 2124   07/12/21 1915  vancomycin (VANCOCIN) powder  Status:  Discontinued          As needed 07/12/21 1915 07/12/21 2124   07/12/21 1745  vancomycin (VANCOCIN) IVPB 1000 mg/200 mL premix        1,000 mg 200 mL/hr over 60 Minutes Intravenous  Once 07/12/21 1741 07/12/21 1759   07/12/21 1710  vancomycin (VANCOCIN) 1-5 GM/200ML-% IVPB       Note to Pharmacy: Hoy Morn E: cabinet override      07/12/21 1710 07/12/21 1742   07/10/21 1400  ceFAZolin (ANCEF) IVPB 2g/100 mL premix        2 g 200 mL/hr over 30 Minutes Intravenous Every 8 hours 07/10/21 1258         Objective: Vitals:   07/26/21 0719 07/26/21  1053  BP: (!) 127/52 (!) 135/51  Pulse: 75   Resp: 20   Temp: 98.4 F (36.9 C)   SpO2: 98%     Intake/Output Summary (Last 24 hours) at 07/26/2021 1321 Last data filed at  07/26/2021 0453 Gross per 24 hour  Intake --  Output 1150 ml  Net -1150 ml    Filed Weights   07/22/21 0500 07/23/21 0500 07/24/21 0500  Weight: 102.3 kg 102.3 kg 103.1 kg   Weight change:  Body mass index is 39.01 kg/m.   Physical Exam: General exam: Pleasant, elderly African-American female.  Not in physical distress Skin: No rashes, lesions or ulcers. HEENT: Atraumatic, normocephalic, no obvious bleeding Lungs: Clear to auscultation bilaterally CVS: Regular rate and rhythm, no murmur GI/Abd soft, nontender, nondistended, bowel sound present CNS: Alert, awake, oriented X 3 Psychiatry: Sad affect Extremities: Left upper extremity lymphedema persist.  Compression bandage on. Data Review: I have personally reviewed the laboratory data and studies available.  F/u labs ordered Unresulted Labs (From admission, onward)     Start     Ordered   07/12/21 0500  CBC with Differential/Platelet  Daily,   R,   Status:  Canceled      07/11/21 1504            Signed, Terrilee Croak, MD [facility] 07/26/2021

## 2021-07-26 NOTE — Progress Notes (Signed)
RT x 2 attempted to speak with pt about being placed on cpap for the night.  Both RTs were ignored while she was on a phone call.  RT spoke with RN about calling when pt was ready to be placed on device.  RT will monitor.

## 2021-07-26 NOTE — Progress Notes (Signed)
RT in to check on CPAP during rounds. Pt on phone, RT will check back later.  ?

## 2021-07-26 NOTE — TOC Progression Note (Addendum)
Transition of Care (TOC) - Progression Note  ? ? ?Patient Details  ?Name: Chemika Nightengale ?MRN: [id number] ?Date of Birth: 1945-09-15 ? ?Transition of Care (TOC) CM/SW Contact  ?Joanne Chars, LCSW ?Phone Number: ?07/26/2021, 10:09 AM ? ?Clinical Narrative:   CSW spoke with pt daughter Varney Biles.  She received the appeal paperwork yesterday and will be submitting it today.  The backup plan if this appeal is unsuccessful will be to return home with [facility] and Varney Biles will stay with her to provide necessary support.  ? ?1340: Pt actively participated with PT today, also had good session on 2/27, CSW spoke with Shirlee Limerick at [facility] who can possibly resubmit a new auth request with these new PT notes.  ? ?Expected Discharge Plan: [place] ?Barriers to Discharge: Continued Medical Work up, SNF Pending bed offer ? ?Expected Discharge Plan and Services ?Expected Discharge Plan: [place] ?In-house Referral: Clinical Social Work ?  ?Post Acute Care Choice: [place] ?Living arrangements for the past 2 months: Apartment ?                ?  ?  ?  ?  ?  ?  ?  ?  ?  ?  ? ? ?Social Determinants of Health (SDOH) Interventions ?  ? ?Readmission Risk Interventions ?No flowsheet data found. ? ?

## 2021-07-26 NOTE — Care Management Important Message (Signed)
Important Message ? ?Patient Details  ?Name: Danielle [name] ?MRN: [id number] ?Date of Birth: 07/06/1945 ? ? ?Medicare Important Message Given:  Yes ? ? ? ? ?Levada Dy  Shonia Skilling-Martin ?07/26/2021, 2:01 PM ?

## 2021-07-27 DIAGNOSIS — T8453XA Infection and inflammatory reaction due to internal right knee prosthesis, initial encounter: Secondary | ICD-10-CM | POA: Diagnosis not present

## 2021-07-27 LAB — GLUCOSE, CAPILLARY
Glucose-Capillary: 93 mg/dL (ref 70–99)
Glucose-Capillary: 104 mg/dL — ABNORMAL HIGH (ref 70–99)
Glucose-Capillary: 96 mg/dL (ref 70–99)
Glucose-Capillary: 150 mg/dL — ABNORMAL HIGH (ref 70–99)

## 2021-07-27 NOTE — Progress Notes (Signed)
Pt unsure if she wants to wear CPAP. RT will check back ?

## 2021-07-27 NOTE — Plan of Care (Signed)

## 2021-07-27 NOTE — Progress Notes (Signed)
Mobility Specialist Progress Note  ? ? 07/27/21 1158  ?Mobility  ?Activity Contraindicated/medical hold  ? ?RN advised to hold off as pt in a lot of pain. Will f/u as schedule permits.  ? ?Hildred Alamin ?Mobility Specialist  ?M.S. 5N: [phone]  ?

## 2021-07-27 NOTE — Progress Notes (Signed)
PROGRESS NOTE  Danielle [name]  DOB: [birth year]  PCP: Seward Carol, MD TIR:[id number]  DOA: 07/10/2021  LOS: 64 days  Hospital Day: 18  Brief narrative: Danielle [name] is a 76 y.o. 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 She presented to the ED on 2/13 for evaluation of failed antibiotic treatment for right knee recurrent septic arthritis.  -December 2019, patient underwent right knee arthroplasty by Dr. Mardelle Matte  -November 2022, patient was seen in his office for severe acute pain and swelling of the right knee.  Right knee aspiration at that time was concerning for staph infection of her prosthetic knee joint.  She underwent I&D with liner exchange and was treated postoperatively with IV antibiotics.  She did well overall and was transitioned as an outpatient from the cefazolin to suppression with Keflex.  Evidently she ran out of Keflex approximately a week ago.  Also for the past 2 to 3 weeks, patient was getting progressively worsening pain and difficulty with ambulation and hence she was admitted on 2/13.  On 2/15, she had explant right total knee arthroplasty with implantation of articulating antibiotic spacer. Postoperatively, she was monitored in ICU and transferred to [facility] next day only to note that the hemoglobin had dropped from 9.2-4.1.  She was transferred back to ICU.  She is a Jehovah witness and in the past refusing blood transfusion.  While in ICU, she was lethargic.  Her daughter gave consent for blood products and hence she got 2 units of PRBC transfusion.  Subsequently patient woke up and then declined any further transfusion. Her hemoglobin has been running in low but stable range since then.  Subjective: Patient was seen and examined this  morning.   Propped up in bed.  Not in distress.  No new symptoms. Frustrated because a long stay Pending appeal decision for SNF.  Patient is also wondering if she will could go home.  But she lives alone.  She is going to discuss this with her daughter.  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  Patient underwent explant right total knee arthroplasty with implantation of articulating antibiotic spacer by Dr. Zachery Dakins on 07/12/2021.    -Infectious disease was consulted.  Patient was transitioned to IV cefazolin.  PICC line has been placed. -Last day of treatment will be 08/23/21.  OPAT orders placed by ID.  Acute on chronic anemia Jehovah witness refusing blood transfusion -Baseline hemoglobin between 8 and 9.   -Postoperatively, patient had a significant drop in hemoglobin from 9.2 to 4.1 on 2/16.   -While in ICU, patient was lethargic and confused.  Consent was obtained from daughter for blood transfusion. She was transfused 2 units of PRBC in the ICU.  CT abdomen pelvis did not show any evidence of retroperitoneal bleed. -Since 2/18, hemoglobin has been stable and gradually improving.  6.9 on last check on 3/1. She got a dose of Aranesp on  2/25.  Per pharmacy, next dose can be given only on 07/28/2021. Recent Labs    07/14/21 0640 07/14/21 1439 07/15/21 0621 07/16/21 0434 07/17/21 1659 07/18/21 0244 07/20/21 0359 07/22/21 0328 07/24/21 0325 07/26/21 0354  HGB 4.1* 4.9* 5.0* 5.3* 5.3* 5.3* 5.4* 5.6* 6.0* 6.9*   Left upper extremity DVT History of left breast cancer Left upper extremity lymphedema -Patient has history of breast cancer with axillary lymph nodes resection after which she has chronic left upper extremity lymphedema.  However this hospitalization, she states it is worse than usual.   -2/24, ultrasound duplex showed an acute DVT involving a single left brachial vein.   -2/27, I discussed the situation with patient's orthopedic Dr. Zachery Dakins and cardiologist Dr. Haroldine Laws.  Patient was on Eliquis prior to surgery as well for A-fib and did fine.  It was held during this hospitalization because of surgery and significant anemia.  She did not have any other source of bleeding other than intraoperative blood loss.  Hemoglobin gradually improving.  We all agreed that at this time, it is more reasonable to resume the patient on Eliquis.  Eliquis resumed. -Compression bandages on for left upper extremity lymphedema.  Uncontrolled type 2 diabetes mellitus -A1c 8.2 on 07/10/2021 -Home meds include Lantus 30 units, sliding scale insulin -Currently blood sugars controlled Semglee 35 units daily and sliding scale insulin.  Continue to monitor. -Continue Neurontin for peripheral neuropathy Recent Labs  Lab 07/26/21 1126 07/26/21 1611 07/26/21 2021 07/27/21 0659 07/27/21 1132  GLUCAP 121* 148* 103* 93 104*   CAD/stents -Had a stent 3 times in the past, last in 2017. -Currently not having anginal symptoms. -Seen by cardiology on 2/14. -Continue Lipitor.  Eliquis resumed  PAF -Eliquis resumed  Chronic combined systolic and diastolic CHF History of hypertension -Last echo from December 2021 with EF 45 to 50%,  grade 2 diastolic dysfunction, mildly elevated pulmonary artery systolic pressure -Home meds include Coreg 12.5 mg twice daily, torsemide 20 mg daily, losartan 25 mg daily and Aldactone 12.5 mg daily. -These meds were initially held because of low blood pressure and elevated creatinine.   -Blood pressure stable.  Resume torsemide every other day.  Continue Coreg. Losartan and Aldactone remain on hold.  Continue to monitor heart rate and blood pressure.  CKD stage IIIa -Creatinine remains at baseline. Continue to monitor.  Torsemide, losartan and Aldactone on hold. Recent Labs    04/19/21 0205 04/21/21 0250 04/24/21 0229 06/22/21 1243 07/10/21 1404 07/12/21 0343 07/13/21 0834 07/14/21 0407 07/15/21 0621 07/16/21 0434 07/20/21 0359 07/22/21 0328 07/24/21 0325 07/26/21 0354  BUN 30* 24* 18 35* 21 19 25* 31* 37* 32*  --   --   --   --   CREATININE 1.27* 1.23* 1.17* 1.49* 1.22* 1.36* 1.41* 1.59* 1.56* 1.23* 1.09* 1.11* 1.28* 1.11*   Hypothyroidism -Synthroid  Morbid obesity  -Body mass index is 39.01 kg/m. Patient has been advised to make an attempt to improve diet and exercise patterns to aid in weight loss.  OSA -CPAP nightly   Mobility: PT eval postprocedure Goals of care   Code Status: Full Code    Nutritional status:  Body mass index is 39.01 kg/m.  Diet:  Diet Order             Diet Carb Modified Fluid consistency: Thin; Room service appropriate? Yes  Diet effective now                   DVT prophylaxis:  SCDs Start: 07/10/21 1254 apixaban (ELIQUIS) tablet 5 mg   Antimicrobials: IV Ancef Fluid: None Family Communication: Daughter not at bedside today.   Status is: Inpatient  Continue in-hospital care because: Pending appeal decision for SNF.  Patient is also wondering if she will could go home.  But she lives alone.  She is going to discuss this with her daughter. Level of care: Med-Surg   Dispo: The patient is from: Home               Anticipated d/c is to: Pending SNF.  Insurance denied.  Family appealed on 2/27              Patient currently is not medically stable to d/c.   Difficult to place patient No     Infusions:   sodium chloride 250 mL (07/13/21 0209)    ceFAZolin (ANCEF) IV 2 g (07/27/21 1435)   lactated ringers 10 mL/hr at 07/22/21 1329    Scheduled Meds:  apixaban  5 mg Oral BID   atorvastatin  80 mg Oral QHS   carvedilol  3.125 mg Oral BID WC   Chlorhexidine Gluconate Cloth  6 each Topical Daily   [START ON 07/28/2021] darbepoetin (ARANESP) injection - NON-DIALYSIS  150 mcg Subcutaneous Once   empagliflozin  10 mg Oral Daily   ferrous sulfate  325 mg Oral BID WC   folic acid  1 mg Oral Daily   insulin aspart  0-15 Units Subcutaneous TID WC   insulin aspart  0-5 Units Subcutaneous QHS   insulin glargine-yfgn  35 Units Subcutaneous QHS   levothyroxine  75 mcg Oral Q0600   mouth rinse  15 mL Mouth Rinse BID   multivitamin with minerals  1 tablet Oral Daily   polyethylene glycol  17 g Oral Daily   senna  1 tablet Oral BID   sodium chloride flush  10-40 mL Intracatheter Q12H   thiamine  100 mg Oral Daily   torsemide  20 mg Oral QODAY    PRN meds: acetaminophen, bisacodyl, diclofenac Sodium, diphenhydrAMINE, HYDROmorphone (DILAUDID) injection, lip balm, ondansetron **OR** ondansetron (ZOFRAN) IV, oxyCODONE, sodium chloride flush   Antimicrobials: Anti-infectives (From admission, onward)    Start     Dose/Rate Route Frequency Ordered Stop   07/12/21 1916  tobramycin (NEBCIN) powder  Status:  Discontinued          As needed 07/12/21 1917 07/12/21 2124   07/12/21 1915  vancomycin (VANCOCIN) powder  Status:  Discontinued          As needed 07/12/21 1915 07/12/21 2124   07/12/21 1745  vancomycin (VANCOCIN) IVPB 1000 mg/200 mL premix        1,000 mg 200 mL/hr over 60 Minutes Intravenous  Once 07/12/21 1741 07/12/21 1759   07/12/21 1710  vancomycin (VANCOCIN) 1-5 GM/200ML-% IVPB       Note to  Pharmacy: Hoy Morn E: cabinet override      07/12/21 1710 07/12/21 1742   07/10/21 1400  ceFAZolin (ANCEF) IVPB 2g/100 mL premix        2 g 200 mL/hr over 30 Minutes Intravenous Every 8 hours 07/10/21 1258         Objective:  Vitals:   07/27/21 0823 07/27/21 1345  BP: (!) 129/49 (!) 141/54  Pulse: 76 72  Resp: 18 18  Temp: 98.5 F (36.9 C) 98.2 F (36.8 C)  SpO2: 99% 98%    Intake/Output Summary (Last 24 hours) at 07/27/2021 1635 Last data filed at 07/27/2021 1300 Gross per 24 hour  Intake 460 ml  Output --  Net 460 ml   Filed Weights   07/22/21 0500 07/23/21 0500 07/24/21 0500  Weight: 102.3 kg 102.3 kg 103.1 kg   Weight change:  Body mass index is 39.01 kg/m.   Physical Exam: General exam: Pleasant, elderly African-American female.  Not in physical distress Skin: No rashes, lesions or ulcers. HEENT: Atraumatic, normocephalic, no obvious bleeding Lungs: Clear to auscultation bilaterally CVS: Regular rate and rhythm, no murmur GI/Abd soft, nontender, nondistended, bowel sound present CNS: Alert, awake, oriented X 3 Psychiatry: Sad affect Extremities: Left upper extremity lymphedema persist.  Compression bandage on. Data Review: I have personally reviewed the laboratory data and studies available.  F/u labs ordered Unresulted Labs (From admission, onward)     Start     Ordered   07/28/21 0500  Hemoglobin and hematocrit, blood  Tomorrow morning,   R       Question:  Specimen collection method  Answer:  IV Team=IV Team collect   07/27/21 0816   07/28/21 0500  Creatinine, serum  Tomorrow morning,   R       Question:  Specimen collection method  Answer:  IV Team=IV Team collect   07/27/21 0816   07/12/21 0500  CBC with Differential/Platelet  Daily,   R,   Status:  Canceled      07/11/21 1504            Signed, Terrilee Croak, MD [facility] 07/27/2021

## 2021-07-27 NOTE — TOC Progression Note (Addendum)
Transition of Care (TOC) - Progression Note  ? ? ?Patient Details  ?Name: Danielle [name] ?MRN: [id number] ?Date of Birth: 09-04-1945 ? ?Transition of Care (TOC) CM/SW Contact  ?Joanne Chars, LCSW ?Phone Number: ?07/27/2021, 10:34 AM ? ?Clinical Narrative:   Cristy Folks spoke with [employer] and they said pursuing appeal would be quicker than requesting new auth. ?' ?CSW reached out to daughter Varney Biles by text and phone call requesting update on appeal being requested.  ? ?12: Text from [name].  Appeal has been filed. ? ?1537: CSW called [employer], took phone to pt room so that [employer] could speak to CSW, they were unable to provide any update on the appeal or even verify that the appeal was showing in their system.  ? ?1430: Pt discussed in QC, leadership will reach out to [place] contact to see if they can get update on appeal status.  ? ?Expected Discharge Plan: [place] ?Barriers to Discharge: Continued Medical Work up, SNF Pending bed offer ? ?Expected Discharge Plan and Services ?Expected Discharge Plan: [place] ?In-house Referral: Clinical Social Work ?  ?Post Acute Care Choice: [place] ?Living arrangements for the past 2 months: Apartment ?                ?  ?  ?  ?  ?  ?  ?  ?  ?  ?  ? ? ?Social Determinants of Health (SDOH) Interventions ?  ? ?Readmission Risk Interventions ?No flowsheet data found. ? ?

## 2021-07-28 LAB — HEMOGLOBIN AND HEMATOCRIT, BLOOD
HCT: 23.8 % — ABNORMAL LOW (ref 36.0–46.0)
Hemoglobin: 7 g/dL — ABNORMAL LOW (ref 12.0–15.0)

## 2021-07-28 LAB — GLUCOSE, CAPILLARY
Glucose-Capillary: 90 mg/dL (ref 70–99)
Glucose-Capillary: 108 mg/dL — ABNORMAL HIGH (ref 70–99)
Glucose-Capillary: 101 mg/dL — ABNORMAL HIGH (ref 70–99)
Glucose-Capillary: 151 mg/dL — ABNORMAL HIGH (ref 70–99)

## 2021-07-28 LAB — CREATININE, SERUM
Creatinine, Ser: 1.08 mg/dL — ABNORMAL HIGH (ref 0.44–1.00)
GFR, Estimated: 54 mL/min — ABNORMAL LOW (ref >60)

## 2021-07-28 NOTE — Progress Notes (Signed)
?  Mobility Specialist Criteria Algorithm Info. ? ? 07/28/21 1615  ?Mobility  ?Activity Refused mobility ?(Still c/o dizziness)  ? ? ? ?07/28/2021 ?4:15 pm ? ?Danielle [name], CMS, BS EXP ?Acute Rehabilitation Services  ?[facility]:[phone] ?Office: [phone] ? ?

## 2021-07-28 NOTE — Progress Notes (Signed)
Patient took herself off CPAP for the night. ?

## 2021-07-28 NOTE — Progress Notes (Signed)
Physical Therapy Treatment ?Patient Details ?Name: Danielle [name] ?MRN: [id number] ?DOB: 1945/12/07 ?Today's Date: 07/28/2021 ? ? ?History of Present Illness Patient is a 76 y/o female admitted due to R TKA infection.  Initially had TKA 04/2018 then had pain/swelling 03/2021 with I&D and liner exchange and IV antibotics.  Admitted with worse pain and difficulty ambulating with fevers.  S/p R explant TKA w/implantation of articulating antibiotic spacer on 07/12/2021.  Post op had severe anemia with transfer to ICU due to [facility] witness and refusing blood products.  Did finally have transfusion 2/17 with hemoglobin at 5.0 after transfusion.  PMH positive for HFpEF, CAD w/ multiple prior PCIs, SSS s/p PPM, Afib on AC, OSA on CPAP, CKD, HTN, DM. ? ?  ?PT Comments  ? ? Pt continues to make progress towards goals with good participation and very pleasant mood this session. Pt mod I to come to sitting EOB with increased time needed and use of bedrails. She continues to need min assist to power up from sitting with good compliance with WB precautions. Once standing pt with max c/o dizziness, gait deferred and pt returned to sitting, BP 129/80. BP in long sitting prior to standing 111/37. Pt needing min guard once standing again to step pivot with RW to recliner. Dizziness resolved in <2 mins and RN notified. Pt with good tolerance for LE therex for increased strength and ROM. Pt continues to benefit from skilled PT services to progress toward functional mobility goals.  ?  ?Recommendations for follow up therapy are one component of a multi-disciplinary discharge planning process, led by the attending physician.  Recommendations may be updated based on patient status, additional functional criteria and insurance authorization. ? ?Follow Up Recommendations ? Skilled nursing-short term rehab (<3 hours/day) ?  ?  ?Assistance Recommended at Discharge Frequent or constant Supervision/Assistance  ?Patient can return home with the  following Two people to help with walking and/or transfers;Two people to help with bathing/dressing/bathroom;Assistance with feeding ?  ?Equipment Recommendations ? None recommended by PT  ?  ?Recommendations for Other Services   ? ? ?  ?Precautions / Restrictions Precautions ?Precautions: Fall;Knee ?Required Braces or Orthoses: Knee Immobilizer - Right ?Knee Immobilizer - Right: On when out of bed or walking ?Restrictions ?Weight Bearing Restrictions: Yes ?RLE Weight Bearing: Partial weight bearing ?RLE Partial Weight Bearing Percentage or Pounds: 50  ?  ? ?Mobility ? Bed Mobility ?Overal bed mobility: Modified Independent ?  ?  ?  ?Supine to sit: Modified independent (Device/Increase time) ?  ?  ?General bed mobility comments: increased time and use of bedrail ?  ? ?Transfers ?Overall transfer level: Needs assistance ?Equipment used: Rolling walker (2 wheels) ?Transfers: Sit to/from Stand, Bed to chair/wheelchair/BSC ?Sit to Stand: Min assist ?  ?Step pivot transfers: Min guard ?  ?  ?  ?General transfer comment: min assist to power up to stand,cues for safe hand placement, once standing max c/o dizziness, sat and checked BP 129/80 (up from 111/37 in sitting) ?  ? ?Ambulation/Gait ?  ?  ?  ?  ?  ?  ?  ?General Gait Details: defered this session secondary to pt max c/o dizziness ? ? ?Stairs ?  ?  ?  ?  ?  ? ? ?Wheelchair Mobility ?  ? ?Modified Rankin (Stroke Patients Only) ?  ? ? ?  ?Balance Overall balance assessment: Needs assistance ?  ?Sitting balance-Leahy Scale: Fair ?Sitting balance - Comments: able to static sit with BUE ?  ?Standing balance support:  Bilateral upper extremity supported, Reliant on assistive device for balance ?Standing balance-Leahy Scale: Poor ?Standing balance comment: reliant on BUE support on RW ?  ?  ?  ?  ?  ?  ?  ?  ?  ?  ?  ?  ? ?  ?Cognition Arousal/Alertness: Awake/alert ?Behavior During Therapy: [facility] for tasks assessed/performed ?Overall Cognitive Status: Impaired/Different from  baseline ?Area of Impairment: Attention, Following commands, Safety/judgement ?  ?  ?  ?  ?  ?  ?  ?  ?  ?Current Attention Level: Focused ?  ?Following Commands: Follows one step commands with increased time, Follows one step commands inconsistently ?Safety/Judgement: Decreased awareness of safety ?  ?  ?  ?  ?  ? ?  ?Exercises General Exercises - Lower Extremity ?Ankle Circles/Pumps: AROM, Both, 20 reps, Supine ?Hip ABduction/ADduction: AROM, 10 reps, Supine, Right ?Straight Leg Raises: AROM, 10 reps, Supine, Right ? ?  ?General Comments General comments (skin integrity, edema, etc.): BP 111/37 in long sitting, once standing pt with c/o max dizziness, returned to sitting to check BP as pt too dizzy to stand, BP 129/80. gait defered and pt able to step piovot to recliner. dizziness resolved in <2 mins ?  ?  ? ?Pertinent Vitals/Pain Pain Assessment ?Pain Assessment: No/denies pain  ? ? ?Home Living   ?  ?  ?  ?  ?  ?  ?  ?  ?  ?   ?  ?Prior Function    ?  ?  ?   ? ?PT Goals (current goals can now be found in the care plan section) Acute Rehab PT Goals ?Patient Stated Goal: to go to rehab ?PT Goal Formulation: With patient/family ?Time For Goal Achievement: 07/29/21 ? ?  ?Frequency ? ? ? Min 3X/week ? ? ? ?  ?PT Plan    ? ? ?Co-evaluation   ?  ?  ?  ?  ? ?  ?AM-PAC PT "6 Clicks" Mobility   ?Outcome Measure ? Help needed turning from your back to your side while in a flat bed without using bedrails?: A Little ?Help needed moving from lying on your back to sitting on the side of a flat bed without using bedrails?: A Little ?Help needed moving to and from a bed to a chair (including a wheelchair)?: A Lot ?Help needed standing up from a chair using your arms (e.g., wheelchair or bedside chair)?: A Little ?Help needed to walk in hospital room?: A Little ?Help needed climbing 3-5 steps with a railing? : Total ?6 Click Score: 15 ? ?  ?End of Session Equipment Utilized During Treatment: Gait belt;Right knee  immobilizer ?Activity Tolerance: Patient tolerated treatment well ?Patient left: in chair;with call bell/phone within reach;with nursing/sitter in room ?Nurse Communication: Mobility status;Other (comment) (dizziness) ?PT Visit Diagnosis: Difficulty in walking, not elsewhere classified (R26.2);Pain;Muscle weakness (generalized) (M62.81) ?Pain - Right/Left: Right ?Pain - part of body: Knee ?  ? ? ?Time: 9937-1696 ?PT Time Calculation (min) (ACUTE ONLY): 33 min ? ?Charges:  $Therapeutic Exercise: 8-22 mins ?$Therapeutic Activity: 8-22 mins          ?          ? ?Audry Riles. PTA ?Acute Rehabilitation Services ?Office: [phone] ? ? ? ?Betsey Holiday Derek Laughter ?07/28/2021, 11:13 AM ? ?

## 2021-07-28 NOTE — Plan of Care (Signed)

## 2021-07-28 NOTE — Progress Notes (Signed)
PROGRESS NOTE  Danielle [name]  DOB: 06-May-1946  PCP: Danielle Carol, MD OZH:[id number]  DOA: 07/10/2021  LOS: 28 days  Hospital Day: 19  Brief narrative: Danielle [name] is a 76 y.o. 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 She presented to the ED on 2/13 for evaluation of failed antibiotic treatment for right knee recurrent septic arthritis.  -December 2019, patient underwent right knee arthroplasty by Dr. Mardelle [name]  -November 2022, patient was seen in his office for severe acute pain and swelling of the right knee.  Right knee aspiration at that time was concerning for staph infection of her prosthetic knee joint.  She underwent I&D with liner exchange and was treated postoperatively with IV antibiotics.  She did well overall and was transitioned as an outpatient from the cefazolin to suppression with Keflex.  Evidently she ran out of Keflex approximately a week ago.  Also for the past 2 to 3 weeks, patient was getting progressively worsening pain and difficulty with ambulation and hence she was admitted on 2/13.  On 2/15, she had explant right total knee arthroplasty with implantation of articulating antibiotic spacer. Postoperatively, she was monitored in ICU and transferred to [facility] next day only to note that the hemoglobin had dropped from 9.2-4.1.  She was transferred back to ICU.  She is a Jehovah witness and in the past refusing blood transfusion.  While in ICU, she was lethargic.  Her daughter gave consent for blood products and hence she got 2 units of PRBC transfusion.  Subsequently patient woke up and then declined any further transfusion. Her hemoglobin has been running in low but stable range since then.  Subjective: Patient was seen and examined this  morning.   Propped up in bed.  Not in distress.  No new symptoms. Frustrated because a long stay. 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  Patient underwent explant right total knee arthroplasty with implantation of articulating antibiotic spacer by Dr. Zachery [name] on 07/12/2021.    -Infectious disease was consulted.  Patient was transitioned to IV cefazolin.  PICC line has been placed. -Last day of treatment will be 08/23/21.  OPAT orders placed by ID.  Acute on chronic anemia Jehovah witness refusing blood transfusion -Baseline hemoglobin between 8 and 9.   -Postoperatively, patient had a significant drop in hemoglobin from 9.2 to 4.1 on 2/16.   -While in ICU, patient was lethargic and confused.  Consent was obtained from daughter for blood transfusion. She was transfused 2 units of PRBC in the ICU.  CT abdomen pelvis did not  show any evidence of retroperitoneal bleed. -Since 2/18, hemoglobin has been stable and gradually improving.  Hemoglobin 7 on last check today.  She got a dose of Aranesp on 2/25 and repeated today 3/3. Recent Labs    07/14/21 1439 07/15/21 2703 07/16/21 0434 07/17/21 1659  07/18/21 0244 07/20/21 0359 07/22/21 0328 07/24/21 0325 07/26/21 0354 07/28/21 0359  HGB 4.9* 5.0* 5.3* 5.3* 5.3* 5.4* 5.6* 6.0* 6.9* 7.0*   Left upper extremity DVT History of left breast cancer Left upper extremity lymphedema -Patient has history of breast cancer with axillary lymph nodes resection after which she has chronic left upper extremity lymphedema.  However this hospitalization, she states it is worse than usual.   -2/24, ultrasound duplex showed an acute DVT involving a single left brachial vein.   -2/27, I discussed the situation with patient's orthopedic Dr. Zachery [name] and cardiologist Dr. Haroldine [name].  Patient was on Eliquis prior to surgery as well for A-fib and did fine.  It was held during this hospitalization because of surgery and significant anemia.  She did not have any other source of bleeding other than intraoperative blood loss.  Hemoglobin gradually improving.  We all agreed that at this time, it is more reasonable to resume the patient on Eliquis.  Eliquis resumed. -Compression bandages on for left upper extremity lymphedema.  Uncontrolled type 2 diabetes mellitus -A1c 8.2 on 07/10/2021 -Currently blood sugars controlled Semglee 35 units daily and sliding scale insulin.  Continue to monitor. -Continue Neurontin for peripheral neuropathy Recent Labs  Lab 07/27/21 1646 07/27/21 2104 07/28/21 0759 07/28/21 1118 07/28/21 1602  GLUCAP 96 150* 90 108* 101*   CAD/stents -Had a stent 3 times in the past, last in 2017. -Currently not having anginal symptoms. -Seen by cardiology on 2/14. -Continue Lipitor.  Eliquis resumed  PAF -Eliquis resumed  Chronic combined systolic and diastolic CHF History of hypertension -Last echo from December 2021 with EF 45 to 50%, grade 2 diastolic dysfunction, mildly elevated pulmonary artery systolic pressure -Home meds include Coreg 12.5 mg twice daily, torsemide 20 mg daily, losartan 25 mg daily and Aldactone 12.5 mg  daily. -These meds were initially held because of low blood pressure and elevated creatinine.   -Later resumed on low-dose of Coreg and torsemide every other day.  Blood pressure and creatinine stable on those. Continue to hold losartan and Aldactone remain on hold.  Continue to monitor heart rate and blood pressure.  CKD stage IIIa -Creatinine remains at baseline. Continue to monitor.  Torsemide, losartan and Aldactone on hold. Recent Labs    04/19/21 0205 04/21/21 0250 04/24/21 0229 06/22/21 1243 07/10/21 1404 07/12/21 0343 07/13/21 0834 07/14/21 0407 07/15/21 0621 07/16/21 0434 07/20/21 0359 07/22/21 0328 07/24/21 0325 07/26/21 0354 07/28/21 0359  BUN 30* 24* 18 35* 21 19 25* 31* 37* 32*  --   --   --   --   --   CREATININE 1.27* 1.23* 1.17* 1.49* 1.22* 1.36* 1.41* 1.59* 1.56* 1.23* 1.09* 1.11* 1.28* 1.11* 1.08*   Hypothyroidism -Synthroid  Morbid obesity  -Body mass index is 35.87 kg/m. Patient has been advised to make an attempt to improve diet and exercise patterns to aid in weight loss.  OSA -CPAP nightly  Goals of care   Code Status: Full Code    Nutritional status:  Body mass index is 35.87 kg/m.      Diet:  Diet Order             Diet Carb Modified Fluid consistency: Thin; Room service  appropriate? Yes  Diet effective now                   DVT prophylaxis:  SCDs Start: 07/10/21 1254 apixaban (ELIQUIS) tablet 5 mg   Antimicrobials: IV Ancef Fluid: None Family Communication: Daughter not at bedside today.  Called and left a message today 3/3  Status is: Inpatient  Continue in-hospital care because: Pending appeal decision for SNF.  Patient is also wondering if she will could go home.  But she lives alone.  She is going to discuss this with her daughter. Level of care: Med-Surg   Dispo: The patient is from: Home              Anticipated d/c is to: Pending SNF.  Insurance denied.  Family appealed on 2/27              Patient currently is  not medically stable to d/c.   Difficult to place patient No     Infusions:   sodium chloride 250 mL (07/13/21 0209)    ceFAZolin (ANCEF) IV 2 g (07/28/21 1443)   lactated ringers 10 mL/hr at 07/22/21 1329    Scheduled Meds:  apixaban  5 mg Oral BID   atorvastatin  80 mg Oral QHS   carvedilol  3.125 mg Oral BID WC   Chlorhexidine Gluconate Cloth  6 each Topical Daily   empagliflozin  10 mg Oral Daily   ferrous sulfate  325 mg Oral BID WC   folic acid  1 mg Oral Daily   insulin aspart  0-15 Units Subcutaneous TID WC   insulin aspart  0-5 Units Subcutaneous QHS   insulin glargine-yfgn  35 Units Subcutaneous QHS   levothyroxine  75 mcg Oral Q0600   mouth rinse  15 mL Mouth Rinse BID   multivitamin with minerals  1 tablet Oral Daily   polyethylene glycol  17 g Oral Daily   senna  1 tablet Oral BID   sodium chloride flush  10-40 mL Intracatheter Q12H   thiamine  100 mg Oral Daily   torsemide  20 mg Oral QODAY    PRN meds: acetaminophen, bisacodyl, diclofenac Sodium, diphenhydrAMINE, HYDROmorphone (DILAUDID) injection, lip balm, ondansetron **OR** ondansetron (ZOFRAN) IV, oxyCODONE, sodium chloride flush   Antimicrobials: Anti-infectives (From admission, onward)    Start     Dose/Rate Route Frequency Ordered Stop   07/12/21 1916  tobramycin (NEBCIN) powder  Status:  Discontinued          As needed 07/12/21 1917 07/12/21 2124   07/12/21 1915  vancomycin (VANCOCIN) powder  Status:  Discontinued          As needed 07/12/21 1915 07/12/21 2124   07/12/21 1745  vancomycin (VANCOCIN) IVPB 1000 mg/200 mL premix        1,000 mg 200 mL/hr over 60 Minutes Intravenous  Once 07/12/21 1741 07/12/21 1759   07/12/21 1710  vancomycin (VANCOCIN) 1-5 GM/200ML-% IVPB       Note to Pharmacy: Hoy Morn E: cabinet override      07/12/21 1710 07/12/21 1742   07/10/21 1400  ceFAZolin (ANCEF) IVPB 2g/100 mL premix        2 g 200 mL/hr over 30 Minutes Intravenous Every 8 hours 07/10/21 1258          Objective: Vitals:   07/28/21 1108 07/28/21 1323  BP: (!) 111/49 (!) 105/53  Pulse: 74 79  Resp: 16 17  Temp: 98.4 F (36.9 C) 98.5 F (36.9 C)  SpO2: 98% 100%    Intake/Output Summary (Last 24 hours) at 07/28/2021 1647 Last data filed at 07/28/2021 1215 Gross per 24 hour  Intake 2217.55 ml  Output --  Net 2217.55 ml   Filed Weights   07/23/21 0500 07/24/21 0500 07/28/21 0611  Weight: 102.3 kg 103.1 kg 94.8 kg   Weight change:  Body mass index is 35.87 kg/m.   Physical Exam: General exam: Pleasant, elderly African-American female.  Not in physical distress Skin: No rashes, lesions or ulcers. HEENT: Atraumatic, normocephalic, no obvious bleeding Lungs: Clear to auscultation bilaterally CVS: Regular rate and rhythm, no murmur GI/Abd soft, nontender, nondistended, bowel sound present CNS: Alert, awake, oriented X 3 Psychiatry: Sad affect Extremities: Left upper extremity lymphedema persist.  Compression bandage on.  Data Review: I have personally reviewed the laboratory data and studies available.  F/u labs ordered Unresulted Labs (From admission, onward)     Start     Ordered   07/12/21 0500  CBC with Differential/Platelet  Daily,   R,   Status:  Canceled      07/11/21 1504            Signed, Terrilee Croak, MD [facility] 07/28/2021

## 2021-07-29 ENCOUNTER — Inpatient Hospital Stay (HOSPITAL_COMMUNITY): Payer: Medicare HMO

## 2021-07-29 DIAGNOSIS — E039 Hypothyroidism, unspecified: Secondary | ICD-10-CM

## 2021-07-29 LAB — GLUCOSE, CAPILLARY
Glucose-Capillary: 92 mg/dL (ref 70–99)
Glucose-Capillary: 100 mg/dL — ABNORMAL HIGH (ref 70–99)
Glucose-Capillary: 92 mg/dL (ref 70–99)
Glucose-Capillary: 125 mg/dL — ABNORMAL HIGH (ref 70–99)

## 2021-07-29 NOTE — Progress Notes (Signed)
? ? ? ?Subjective: ?17 Days Post-Op s/p Procedure(s): ?EXCISIONAL TOTAL KNEE ARTHROPLASTY WITH ANTIBIOTIC SPACERS ?Doing well on the floor. Had episode of dizziness yesterday with PT, feels better this morning. Pain well controlled. Left arm swelling improving. ? ?Objective:  ?PE: ?VITALS:   ?Vitals:  ? 07/28/21 1745 07/28/21 2048 07/29/21 0354 07/29/21 0445  ?BP: 131/61 (!) 102/52  (!) 110/51  ?Pulse: 74 73    ?Resp: '17 20  16  '$ ?Temp:  98.4 ?F (36.9 ?C)    ?TempSrc:  Oral    ?SpO2: 97% 94%  94%  ?Weight:   95.7 kg   ?Height:      ? ?Neurovascular intact ?Sensation intact distally ?Dorsiflexion/Plantar flexion intact ?Incision: sutures c/d/I, no erythema. Tolerated knee ROM 0-95 this morning. ?Compartment soft ?Swelling of the left ankle ?Endorses distal sensation ? ?LABS ? ?Results for orders placed or performed during the hospital encounter of 07/10/21 (from the past 24 hour(s))  ?Glucose, capillary     Status: Abnormal  ? Collection Time: 07/28/21 11:18 AM  ?Result Value Ref Range  ? Glucose-Capillary 108 (H) 70 - 99 mg/dL  ?Glucose, capillary     Status: Abnormal  ? Collection Time: 07/28/21  4:02 PM  ?Result Value Ref Range  ? Glucose-Capillary 101 (H) 70 - 99 mg/dL  ?Glucose, capillary     Status: Abnormal  ? Collection Time: 07/28/21  8:46 PM  ?Result Value Ref Range  ? Glucose-Capillary 151 (H) 70 - 99 mg/dL  ?Glucose, capillary     Status: None  ? Collection Time: 07/29/21  6:36 AM  ?Result Value Ref Range  ? Glucose-Capillary 92 70 - 99 mg/dL  ? ? ?No results found. ? ?Assessment/Plan: ?Principal Problem: ?  Septic Arthritis, Infection of prosthetic right knee joint ([place]), MSSA infection ?Active Problems: ?  Essential hypertension ?  Hypothyroidism ?  Obesity (BMI 30-39.9) ?  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  Type 2 diabetes mellitus with diabetic polyneuropathy, with long-term current use of insulin ([place]) ?  Refusal of blood transfusions as patient is Jehovah's Witness ?  Paroxysmal atrial  fibrillation (HCC) ?  CKD (chronic kidney disease), stage III ([place]) ?  Complication of internal right knee prosthesis ([place]) ?  Anemia ?  Acute blood loss anemia ? ?S/p R TKA explant for prosthetic joint infection with placement of antibiotic articulating knee spacer 2/15 ? ?17 Days Post-Op s/p Procedure(s): ?EXCISIONAL TOTAL KNEE ARTHROPLASTY WITH ANTIBIOTIC SPACERS ? ?Severe post op blood loss anemia.  Hemoglobin improving 6.9 ? ?Patient refusing blood products at this time as she is a Sales promotion account executive witness. Daughter overridden no blood order on 2/17 and she received 2 units PRBC for Hbg of 4.1. ? ?Post op recs: ?WB: 50% PWB, knee immobilizer when walking.  Can be removed when in bed or working with physical therapy ?Dressing: Sutures removed 3/4. Gauze and ace wrap dressing applied. Can be removed in 1-2 days and then incision can be left open to air. ?Abx: continue ancef per infectious disease recommendations, follow-up Intra-Op cultures: MSSA. Aspiration results from clinic aspiration on 07/10/21 growing MSSA. ?Imaging: PACU xrays, repeat post op xrays ordered 3/4 ?DVT prophylaxis: per medicine team, plan to resume full dose eliquis at this point to address LUE DVT and a. Fib. ?Follow up: 2-3 week after surgery for a wound check with Dr. Zachery Dakins at [facility].  ?Address: [address] [place], [place], [place] [address]  ?Office Phone: [phone] ? ? ?Contact information:   ?  After hours and holidays please check [email or web address] for group call information for [facility] ? ?Laurianne Floresca A Sheriece Jefcoat ?07/29/2021, 8:02 AM  ?

## 2021-07-29 NOTE — Progress Notes (Signed)
PROGRESS NOTE    Danielle [name]  ERD:[id number] DOB: 1946-04-03 DOA: 07/10/2021 PCP: Seward Carol, MD   Brief Narrative:  Danielle [name] is a 76 y.o. 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 She presented to the ED on 2/13 for evaluation of failed antibiotic treatment for right knee recurrent septic arthritis.   -December 2019, patient underwent right knee arthroplasty by Dr. Mardelle Matte  -November 2022, patient was seen in his office for severe acute pain and swelling of the right knee.  Right knee aspiration at that time was concerning for staph infection of her prosthetic knee joint.  She underwent I&D with liner exchange and was treated postoperatively with IV antibiotics.  She did well overall and was transitioned as an outpatient from the cefazolin to suppression with Keflex.  Evidently she ran out of Keflex approximately a week ago.  Also for the past 2 to 3 weeks, patient was getting progressively worsening pain and difficulty with ambulation and hence she was admitted on 2/13.   On 2/15, she had explant right total knee arthroplasty with implantation of articulating antibiotic spacer. Postoperatively, she was monitored in ICU and transferred to [facility] next day only to note that the hemoglobin had dropped from 9.2-4.1.  She was transferred back to ICU.  She is a Jehovah witness and in the past refusing blood transfusion.  While in ICU, she was lethargic.  Her daughter gave consent for blood products and hence she got 2 units of PRBC transfusion.  Subsequently patient woke up and then declined any further transfusion. Her hemoglobin has been running in low but stable range since then.   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  Patient underwent explant right total knee arthroplasty with implantation of articulating antibiotic spacer by Dr. Zachery Dakins on 07/12/2021.    -Infectious disease was consulted.  Patient was transitioned to IV cefazolin.  PICC line has been placed. -Last day of treatment will be 08/23/21.  OPAT orders placed by ID.   Acute on chronic anemia Jehovah witness refusing blood transfusion -Baseline hemoglobin between 8 and 9.   -Postoperatively, patient had a significant drop in hemoglobin from 9.2 to 4.1 on 2/16.   -While in ICU, patient was lethargic and confused.  Consent was obtained from daughter for blood transfusion. She was transfused 2 units of PRBC in the ICU.  CT abdomen pelvis did not show any evidence of retroperitoneal bleed. -Since 2/18, hemoglobin has been stable and gradually improving.  Hemoglobin 7 on last check 3/3, limiting checks 2/2 jeh wit status and hg 7, rechk in am  She received of Aranesp on 2/25 and repeated 3/3.   Left upper extremity DVT History of left breast cancer Left upper extremity lymphedema -Patient has history of breast cancer with axillary lymph nodes resection after which she has chronic left upper extremity lymphedema.  However this  hospitalization, she states it is worse than usual.   -2/24, ultrasound duplex showed an acute DVT involving a single left brachial vein.   -2/27, I discussed the situation with patient's orthopedic Dr. Zachery Dakins and cardiologist Dr. Haroldine Laws.  Patient was on Eliquis prior to surgery as well for A-fib and did fine.  It was held during this hospitalization because of surgery and significant anemia.  She did not have any other source of bleeding other than intraoperative blood loss.  Hemoglobin gradually improving.  We all agreed that at this time, it is more reasonable to resume the patient on Eliquis.  Eliquis resumed. -Compression bandages on for left upper extremity lymphedema.   Uncontrolled type 2 diabetes mellitus -A1c 8.2 on 07/10/2021 -Currently blood sugars controlled Semglee 35 units daily and sliding scale insulin.  Continue to monitor. -Continue Neurontin for peripheral neuropathy   CAD/stents -Had a stent 3 times in the past, last in 2017. -Currently not having anginal symptoms. -Seen by cardiology on 2/14. -Continue Lipitor.  Eliquis resumed   PAF -Eliquis resumed   Chronic combined systolic and diastolic CHF History of hypertension -Last echo from December 2021 with EF 45 to 66%, grade 2 diastolic dysfunction, mildly elevated pulmonary artery systolic pressure -Home meds include Coreg 12.5 mg twice daily, torsemide 20 mg daily, losartan 25 mg daily and Aldactone 12.5 mg daily. -These meds were initially held because of low blood pressure and elevated creatinine.   -Later resumed on low-dose of Coreg and torsemide every other day.  Blood pressure and creatinine stable on those. Continue to hold losartan and Aldactone remain on hold.   -Continue to monitor heart rate and blood pressure, slowly improving   CKD stage IIIa -Creatinine remains at baseline. Continue to monitor.  Torsemide, losartan and Aldactone on hold.   Hypothyroidism -Synthroid   Morbid obesity  -Body  mass index is 35.87 kg/m. Patient has been advised to make an attempt to improve diet and exercise patterns to aid in weight loss.   OSA -CPAP nightly  DVT prophylaxis: [id number]:[id number]  Code Status: Full    Code Status Orders  (From admission, onward)           Start     Ordered   07/10/21 1254  Full code  Continuous        07/10/21 1253           Code Status History     Date Active Date Inactive Code Status Order ID Comments User Context   04/13/2021 1516 04/25/2021 0045 Full Code [id number]  Lequita Halt, MD ED   05/20/2018 1918 05/23/2018 1852 Full Code [id number]  Hosie Poisson, MD Inpatient   05/13/2018 1236 05/17/2018 1549 Full Code [id number]  Marchia Bond, MD Inpatient   02/03/2017 1341 02/06/2017 1821 Full Code [id number]  Truett Mainland, DO Inpatient      Advance Directive Documentation    Flowsheet Row Most Recent Value  Type of Advance Directive Living will  Pre-existing out of facility DNR order (yellow form or pink MOST form) --  "MOST" Form in Place? --      Family Communication: tried calling daughter, n/a, LM  Disposition Plan:   ANTICIPATE snf ONCE [place] called:  None Admission status: Inpatient   Consultants:  ORTHO  Procedures:  CT ABDOMEN PELVIS WO CONTRAST  Result Date: 07/14/2021 CLINICAL DATA:  Retroperitoneal bleed suspected acute blood loss anemia, rule out retroperitoneal bleed EXAM: CT ABDOMEN AND PELVIS WITHOUT CONTRAST TECHNIQUE: Multidetector CT imaging of the abdomen and pelvis was performed following the standard protocol without IV contrast. RADIATION DOSE REDUCTION: This exam was performed according to the departmental dose-optimization program which includes automated exposure control, adjustment of the mA and/or kV according to patient size and/or use of iterative reconstruction technique. COMPARISON:  CT abdomen pelvis 12/04/2019. FINDINGS: Lower chest: No acute abnormality. Bibasilar dependent mild  atelectasis. Mitral annular calcifications. Partially visualized dual chamber pacemaker leads. Hepatobiliary: No focal liver abnormality is seen. Prior cholecystectomy. Pancreas: Unremarkable. No pancreatic ductal dilatation or surrounding inflammatory changes. Spleen: Normal in size without focal abnormality. Adrenals/Urinary Tract: Adrenal glands are unremarkable. No hydronephrosis or nephrolithiasis. The bladder is decompressed with Foley catheter in place. Stomach/Bowel: There is a gastric diverticulum posteriorly. There is no evidence of bowel obstruction.The appendix is normal. No acute inflammatory process involving the bowel. Vascular/Lymphatic: Aortoiliac atherosclerotic calcifications. No AAA. No lymphadenopathy. Reproductive: Unchanged left adnexal calcification. Other: Fat containing umbilical hernia. Mild diastasis recti. There is no evidence of retroperitoneal hematoma. No abdominopelvic ascites. No free air. Musculoskeletal: No acute osseous abnormality. No suspicious lytic or blastic lesions. Bilateral hip osteoarthritis. Moderate severe changes of the pubic symphysis. Multilevel degenerative changes of the spine, worst at L4-L5 with grade 1 anterolisthesis. No suspicious lytic or blastic lesions. IMPRESSION: No acute abdominopelvic abnormality. No evidence of retroperitoneal or intraperitoneal hematoma. Bibasilar atelectasis, corresponding to opacities seen on chest radiograph acquired earlier today. Electronically Signed   By: Maurine Simmering M.D.   On: 07/14/2021 17:36   DG Knee 1-2 Views Right  Result Date: 07/29/2021 CLINICAL DATA:  Concern for infection of knee replacement. EXAM: RIGHT KNEE - 1-2 VIEW COMPARISON:  Postoperative knee radiographs-07/12/2021; 04/14/2021 FINDINGS: Stable sequela redo total knee replacement including removal of the initial radiopaque tibial plateau prosthetic hardware (with presumed placement of a radiolucent antibiotic spacer) and exchange for a new radiopaque  femoral component with persistent lucency about the anterior superior aspect of the new femoral component. No definitive evidence of hardware failure or loosening. No fracture or dislocation. There remains a small amount of scattered intra-articular postoperative air. Persistent adjacent soft tissue stranding. Extensive vascular calcifications. No radiopaque foreign body. 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 Electronically Signed   By: Sandi Mariscal M.D.   On: 07/29/2021 10:46   DG Chest Port 1 View  Result Date: 07/15/2021 CLINICAL DATA:  Respiration abnormal EXAM: PORTABLE CHEST 1 VIEW COMPARISON:  Radiograph 07/14/2021 FINDINGS: Unchanged cardiomediastinal silhouette with dual chamber pacemaker leads. Mildly increased interstitial opacities. No large pleural effusion. No visible pneumothorax. Bibasilar atelectasis. No acute osseous abnormality. IMPRESSION: Mildly increased diffuse interstitial opacities, possibly developing interstitial edema. Electronically Signed   By: Maurine Simmering M.D.   On: 07/15/2021 09:51   DG CHEST PORT 1 VIEW  Result Date: 07/14/2021 CLINICAL DATA:  Pneumonia EXAM: PORTABLE CHEST 1 VIEW COMPARISON:  Radiograph 07/10/2020 FINDINGS: Unchanged dual chamber pacemaker leads. Unchanged cardiomediastinal silhouette. Bibasilar opacities favored to be atelectasis. No new airspace disease. No large pleural effusion. No visible pneumothorax. No acute osseous abnormality. IMPRESSION: Bibasilar opacities favored to be atelectasis, developing infection possible. Recommend radiographic follow-up. Electronically Signed   By: Maurine Simmering M.D.   On: 07/14/2021 12:01  DG Chest Port 1 View  Result Date: 07/10/2021 CLINICAL DATA:  CHF EXAM: PORTABLE CHEST 1 VIEW COMPARISON:  04/13/2021 FINDINGS: Left pacer remains in place, unchanged. Heart is borderline in size. Aortic atherosclerosis. No confluent opacities, effusions or  edema. No acute bony abnormality. IMPRESSION: No active disease. Electronically Signed   By: Rolm Baptise M.D.   On: 07/10/2021 19:13   DG Knee Right Port  Result Date: 07/12/2021 CLINICAL DATA:  Postoperative knee replacement. EXAM: PORTABLE RIGHT KNEE - 1-2 VIEW COMPARISON:  04/14/2021 FINDINGS: There is interval removal of the prior tibial plateau hardware and prior femoral condyle hardware. There is new cement related to new stem tunnels within the distal femur and proximal tibia. No metallic tibial prosthesis material is currently visualized. There is scattered bone density with chronic well corticated edges overlying the peripheral medial and lateral compartments. Severe patellofemoral joint space narrowing. Likely wound VAC drain overlies the proximal anterior knee. Mild soft tissue swelling and subcutaneous air consistent with recent surgery. Dense vascular calcifications. IMPRESSION: Interval recent revision of right knee arthroplasty hardware. No evidence of hardware failure. Electronically Signed   By: Yvonne Kendall M.D.   On: 07/12/2021 22:25   ECHOCARDIOGRAM COMPLETE  Result Date: 07/18/2021    ECHOCARDIOGRAM REPORT   Patient Name:   PREEYA CLECKLEY Date of Exam: 07/18/2021 Medical Rec #:  [id number]     Height:       64.0 in Accession #:    [id number]    Weight:       217.2 lb Date of Birth:  March 01, 1946     BSA:          2.026 m Patient Age:    70 years      BP:           101/42 mmHg Patient Gender: F             HR:           80 bpm. Exam Location:  Inpatient Procedure: 2D Echo, Cardiac Doppler and Color Doppler Indications:    I38 ENDOCARDITIS  History:        Patient has prior history of Echocardiogram examinations, most                 recent 04/29/2020. CHF, Signs/Symptoms:Dyspnea; Risk                 Factors:Hypertension and Diabetes. CAD / CHEMO.  Sonographer:    Beryle Beams Referring Phys: [phone] LINDSAY NICOLE [place]  1. Left ventricular ejection fraction, by estimation, is 40  to 45%. The left ventricle has mildly decreased function. The left ventricle demonstrates regional wall motion abnormalities (see scoring diagram/findings for description). Left ventricular diastolic parameters are consistent with Grade II diastolic dysfunction (pseudonormalization). Elevated left atrial pressure.  2. Right ventricular systolic function is normal. The right ventricular size is normal.  3. Left atrial size was severely dilated.  4. The mitral valve is grossly normal. Mild to moderate mitral valve regurgitation. The mean mitral valve gradient is 5.5 mmHg with average heart rate of 82 bpm. Moderate mitral annular calcification.  5. Tricuspid valve regurgitation is moderate.  6. The aortic valve is tricuspid. There is mild calcification of the aortic valve. There is mild thickening of the aortic valve. Aortic valve regurgitation is mild. Aortic valve sclerosis/calcification is present, without any evidence of aortic stenosis.  7. The inferior vena cava is dilated in size with <50% respiratory variability, suggesting right atrial pressure of 15  mmHg. Comparison(s): Prior images reviewed side by side. The left ventricular function is worsened. The left ventricular wall motion abnormality is worse. The distribution of the wall motion abnormality is the same, but the reduction in contractility is worse. FINDINGS  Left Ventricle: Left ventricular ejection fraction, by estimation, is 40 to 45%. The left ventricle has mildly decreased function. The left ventricle demonstrates regional wall motion abnormalities. The left ventricular internal cavity size was normal in size. There is borderline concentric left ventricular hypertrophy. Left ventricular diastolic parameters are consistent with Grade II diastolic dysfunction (pseudonormalization). Elevated left atrial pressure.  LV Wall Scoring: The apical septal segment, apical inferior segment, and apex are akinetic. The apical anterior segment is hypokinetic.  Right Ventricle: The right ventricular size is normal. Right vetricular wall thickness was not well visualized. Right ventricular systolic function is normal. Left Atrium: Left atrial size was severely dilated. Right Atrium: Right atrial size was normal in size. Pericardium: There is no evidence of pericardial effusion. Mitral Valve: The mitral valve is grossly normal. There is mild thickening of the mitral valve leaflet(s). Moderate mitral annular calcification. Mild to moderate mitral valve regurgitation, with centrally-directed jet. MV peak gradient, 69.2 mmHg. The mean mitral valve gradient is 5.5 mmHg with average heart rate of 82 bpm. Tricuspid Valve: The tricuspid valve is normal in structure. Tricuspid valve regurgitation is moderate. Aortic Valve: The aortic valve is tricuspid. There is mild calcification of the aortic valve. There is mild thickening of the aortic valve. Aortic valve regurgitation is mild. Aortic regurgitation PHT measures 460 msec. Aortic valve sclerosis/calcification is present, without any evidence of aortic stenosis. Aortic valve mean gradient measures 8.0 mmHg. Aortic valve peak gradient measures 12.1 mmHg. Aortic valve area, by VTI measures 1.32 cm. Pulmonic Valve: The pulmonic valve was grossly normal. Pulmonic valve regurgitation is not visualized. Aorta: The aortic root and ascending aorta are structurally normal, with no evidence of dilitation. Venous: The inferior vena cava is dilated in size with less than 50% respiratory variability, suggesting right atrial pressure of 15 mmHg. IAS/Shunts: No atrial level shunt detected by color flow Doppler.  LEFT VENTRICLE PLAX 2D LVIDd:         5.10 cm     Diastology LVIDs:         2.80 cm     LV e' medial:    4.05 cm/s LV PW:         1.30 cm     LV E/e' medial:  37.0 LV IVS:        1.10 cm     LV e' lateral:   5.03 cm/s LVOT diam:     1.90 cm     LV E/e' lateral: 29.8 LV SV:         49 LV SV Index:   24 LVOT Area:     2.84 cm  LV Volumes  (MOD) LV vol d, MOD A2C: 78.4 ml LV vol d, MOD A4C: 89.4 ml LV vol s, MOD A2C: 26.9 ml LV vol s, MOD A4C: 38.5 ml LV SV MOD A2C:     51.5 ml LV SV MOD A4C:     89.4 ml LV SV MOD BP:      52.0 ml RIGHT VENTRICLE RV S prime:     12.90 cm/s TAPSE (M-mode): 2.0 cm LEFT ATRIUM             Index LA diam:        5.30 cm 2.62 cm/m LA Vol (A2C):  89.2 ml 44.03 ml/m LA Vol (A4C):   88.9 ml 43.88 ml/m LA Biplane Vol: 92.3 ml 45.56 ml/m  AORTIC VALVE                     PULMONIC VALVE AV Area (Vmax):    1.34 cm      PV Vmax:       0.60 m/s AV Area (Vmean):   1.21 cm      PV Vmean:      41.600 cm/s AV Area (VTI):     1.32 cm      PV VTI:        0.143 m AV Vmax:           174.00 cm/s   PV Peak grad:  1.4 mmHg AV Vmean:          132.000 cm/s  PV Mean grad:  1.0 mmHg AV VTI:            0.371 m AV Peak Grad:      12.1 mmHg AV Mean Grad:      8.0 mmHg LVOT Vmax:         82.50 cm/s LVOT Vmean:        56.300 cm/s LVOT VTI:          0.173 m LVOT/AV VTI ratio: 0.47 AI PHT:            460 msec  AORTA Ao Root diam: 2.60 cm Ao Asc diam:  2.60 cm MITRAL VALVE                TRICUSPID VALVE MV Area (PHT): 3.27 cm     TV Peak grad:   22.7 mmHg MV Area VTI:   0.34 cm     TV Mean grad:   16.0 mmHg MV Peak grad:  69.2 mmHg    TV Vmax:        2.38 m/s MV Mean grad:  5.5 mmHg     TV Vmean:       192.0 cm/s MV Vmax:       4.16 m/s     TV VTI:         0.72 msec MV Vmean:      361.0 cm/s MV Decel Time: 232 msec     SHUNTS MV E velocity: 150.00 cm/s  Systemic VTI:  0.17 m MV A velocity: 120.00 cm/s  Systemic Diam: 1.90 cm MV E/A ratio:  1.25 Mihai Croitoru MD Electronically signed by Sanda Klein MD Signature Date/Time: 07/18/2021/3:35:46 PM    Final    VAS [place] LOWER EXTREMITY VENOUS (DVT)  Result Date: 07/11/2021  Lower Venous DVT Study Patient Name:  KINSLIE HOVE  Date of Exam:   07/11/2021 Medical Rec #: [id number]      Accession #:    [id number] Date of Birth: Mar 23, 1946      Patient Gender: F Patient Age:   64 years Exam Location:   [facility] Procedure:      VAS [place] LOWER EXTREMITY VENOUS (DVT) Referring Phys: Wynetta Fines --------------------------------------------------------------------------------  Indications: Swelling.  Comparison Study: 04/13/21 prior Performing Technologist: Archie Patten RVS  Examination Guidelines: A complete evaluation includes B-mode imaging, spectral Doppler, color Doppler, and power Doppler as needed of all accessible portions of each vessel. Bilateral testing is considered an integral part of a complete examination. Limited examinations for reoccurring indications may be performed as noted. The reflux portion of the exam is performed with the patient in reverse Trendelenburg.  +---------+---------------+---------+-----------+----------+---------------+  RIGHT     Compressibility Phasicity Spontaneity Properties Thrombus Aging   +---------+---------------+---------+-----------+----------+---------------+  CFV       Full            Yes       Yes                                     +---------+---------------+---------+-----------+----------+---------------+  SFJ       Full                                                              +---------+---------------+---------+-----------+----------+---------------+  FV Prox   Full                                                              +---------+---------------+---------+-----------+----------+---------------+  FV Mid    Full                                                              +---------+---------------+---------+-----------+----------+---------------+  FV Distal Full                                                              +---------+---------------+---------+-----------+----------+---------------+  PFV       Full                                                              +---------+---------------+---------+-----------+----------+---------------+  POP       Full            Yes       Yes                                      +---------+---------------+---------+-----------+----------+---------------+  PTV       Full                                                              +---------+---------------+---------+-----------+----------+---------------+  PERO  patent by color  +---------+---------------+---------+-----------+----------+---------------+   +----+---------------+---------+-----------+----------+--------------+  LEFT Compressibility Phasicity Spontaneity Properties Thrombus Aging  +----+---------------+---------+-----------+----------+--------------+  CFV  Full            Yes       Yes                                    +----+---------------+---------+-----------+----------+--------------+     Summary: RIGHT: - There is no evidence of deep vein thrombosis in the lower extremity.  - No cystic structure found in the popliteal fossa.  LEFT: - No evidence of common femoral vein obstruction.  *See table(s) above for measurements and observations. Electronically signed by Deitra Mayo MD on 07/11/2021 at 4:11:38 PM.    Final    VAS [place] UPPER EXTREMITY VENOUS DUPLEX  Result Date: 07/21/2021 UPPER VENOUS STUDY  Patient Name:  SHYLO ZAMOR  Date of Exam:   07/21/2021 Medical Rec #: [id number]      Accession #:    [id number] Date of Birth: 1945/09/10      Patient Gender: F Patient Age:   30 years Exam [facility]  [facility] Procedure:      VAS [place] UPPER EXTREMITY VENOUS DUPLEX Referring Phys: Terrilee Croak --------------------------------------------------------------------------------  Indications: Edema Limitations: Poor ultrasound/tissue interface. Comparison Study: No prior study Performing Technologist: Maudry Mayhew MHA, RDMS, RVT, RDCS  Examination Guidelines: A complete evaluation includes B-mode imaging, spectral Doppler, color Doppler, and power Doppler as needed of all accessible portions of each vessel. Bilateral testing is considered an integral part of  a complete examination. Limited examinations for reoccurring indications may be performed as noted.  Right Findings: +----------+------------+---------+-----------+------------------------+-------+  RIGHT      Compressible Phasicity Spontaneous        Properties        Summary  +----------+------------+---------+-----------+------------------------+-------+  Subclavian                 Yes        Yes     Patent surrounding PICC                                                                    line                    +----------+------------+---------+-----------+------------------------+-------+  Left Findings: +----------+------------+---------+-----------+----------+-------+  LEFT       Compressible Phasicity Spontaneous Properties Summary  +----------+------------+---------+-----------+----------+-------+  IJV            Full        Yes        Yes                         +----------+------------+---------+-----------+----------+-------+  Subclavian     Full        Yes        Yes                         +----------+------------+---------+-----------+----------+-------+  Axillary       Full        Yes        Yes                         +----------+------------+---------+-----------+----------+-------+  Brachial       None                   No                  Acute   +----------+------------+---------+-----------+----------+-------+  Radial         Full                                               +----------+------------+---------+-----------+----------+-------+  Ulnar          Full                                               +----------+------------+---------+-----------+----------+-------+  Cephalic       Full                                               +----------+------------+---------+-----------+----------+-------+  Basilic        None                                       Acute   +----------+------------+---------+-----------+----------+-------+  Summary:  Right: No evidence of thrombosis in the  subclavian.  Left: Findings consistent with acute deep vein thrombosis involving a single left brachial vein. Findings consistent with acute superficial vein thrombosis involving the left basilic vein.  *See table(s) above for measurements and observations.  Diagnosing physician: Monica Martinez MD Electronically signed by Monica Martinez MD on 07/21/2021 at 5:23:08 PM.    Final    [place] EKG SITE RITE  Result Date: 07/13/2021 If Site Rite image not attached, placement could not be confirmed due to current cardiac rhythm.   Antimicrobials:  IN ANCEF    Subjective: RESTING IN BED, NO COMPLAINTS, NO ACUTE CHANGES OVERNIGHT  Objective: Vitals:   07/28/21 2048 07/29/21 0354 07/29/21 0445 07/29/21 0814  BP: (!) 102/52  (!) 110/51 (!) 123/56  Pulse: 73   71  Resp: '20  16 11  '$ Temp: 98.4 F (36.9 C)   98.1 F (36.7 C)  TempSrc: Oral   Oral  SpO2: 94%  94% 96%  Weight:  95.7 kg    Height:       No intake or output data in the 24 hours ending 07/29/21 1418 Filed Weights   07/24/21 0500 07/28/21 0611 07/29/21 0354  Weight: 103.1 kg 94.8 kg 95.7 kg    Examination:  General exam: Pleasant, elderly African-American female.  Not in physical distress Skin: No rashes, lesions or ulcers. HEENT: Atraumatic, normocephalic, no obvious bleeding Lungs: Clear to auscultation bilaterally CVS: Regular rate and rhythm, no murmur GI/Abd soft, nontender, nondistended, bowel sound present CNS: Alert, awake, oriented X 3 Psychiatry: PLEASANT, 'TIRED OF BEING SICK" Extremities: Left upper extremity lymphedema persist.  Compression bandage on     Data Reviewed: I have personally reviewed following labs and imaging studies  CBC: Recent Labs  Lab 07/24/21 0325 07/26/21 0354 07/28/21 0359  HGB 6.0* 6.9* 7.0*  HCT 20.8* 23.1* 81.0*   Basic Metabolic  Panel: Recent Labs  Lab 07/24/21 0325 07/26/21 0354 07/28/21 0359  CREATININE 1.28* 1.11* 1.08*   GFR: Estimated Creatinine Clearance: 51.3  mL/min (A) (by C-G formula based on SCr of 1.08 mg/dL (H)). Liver Function Tests: No results for input(s): AST, ALT, ALKPHOS, BILITOT, PROT, ALBUMIN in the last 168 hours. No results for input(s): LIPASE, AMYLASE in the last 168 hours. No results for input(s): AMMONIA in the last 168 hours. Coagulation Profile: No results for input(s): INR, PROTIME in the last 168 hours. Cardiac Enzymes: No results for input(s): CKTOTAL, CKMB, CKMBINDEX, TROPONINI in the last 168 hours. BNP (last 3 results) No results for input(s): PROBNP in the last 8760 hours. HbA1C: No results for input(s): HGBA1C in the last 72 hours. CBG: Recent Labs  Lab 07/28/21 1118 07/28/21 1602 07/28/21 2046 07/29/21 0636 07/29/21 1209  GLUCAP 108* 101* 151* 92 100*   Lipid Profile: No results for input(s): CHOL, HDL, LDLCALC, TRIG, CHOLHDL, LDLDIRECT in the last 72 hours. Thyroid Function Tests: No results for input(s): TSH, T4TOTAL, FREET4, T3FREE, THYROIDAB in the last 72 hours. Anemia Panel: No results for input(s): VITAMINB12, FOLATE, FERRITIN, TIBC, IRON, RETICCTPCT in the last 72 hours. Sepsis Labs: No results for input(s): PROCALCITON, LATICACIDVEN in the last 168 hours.  No results found for this or any previous visit (from the past 240 hour(s)).       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 EXAM: RIGHT KNEE - 1-2 VIEW COMPARISON:  Postoperative knee radiographs-07/12/2021; 04/14/2021 FINDINGS: Stable sequela redo total knee replacement including removal of the initial radiopaque tibial plateau prosthetic hardware (with presumed placement of a radiolucent antibiotic spacer) and exchange for a new radiopaque femoral component with persistent lucency about the anterior superior aspect of the new femoral component. No definitive evidence of hardware failure or loosening. No fracture or dislocation. There remains a small  amount of scattered intra-articular postoperative air. Persistent adjacent soft tissue stranding. Extensive vascular calcifications. No radiopaque foreign body. 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 Electronically Signed   By: Sandi Mariscal M.D.   On: 07/29/2021 10:46        Scheduled Meds:  apixaban  5 mg Oral BID   atorvastatin  80 mg Oral QHS   carvedilol  3.125 mg Oral BID WC   Chlorhexidine Gluconate Cloth  6 each Topical Daily   empagliflozin  10 mg Oral Daily   ferrous sulfate  325 mg Oral BID WC   folic acid  1 mg Oral Daily   insulin aspart  0-15 Units Subcutaneous TID WC   insulin aspart  0-5 Units Subcutaneous QHS   insulin glargine-yfgn  35 Units Subcutaneous QHS   levothyroxine  75 mcg Oral Q0600   mouth rinse  15 mL Mouth Rinse BID   multivitamin with minerals  1 tablet Oral Daily   polyethylene glycol  17 g Oral Daily   senna  1 tablet Oral BID   sodium chloride flush  10-40 mL Intracatheter Q12H   thiamine  100 mg Oral Daily   torsemide  20 mg Oral QODAY   Continuous Infusions:  sodium chloride 250 mL (07/29/21 1146)    ceFAZolin (ANCEF) IV 2 g (07/29/21 0516)   lactated ringers 10 mL/hr at 07/22/21 1329     LOS: 19 days    Time spent: 35 min    Nicolette Bang, MD [facility]  If 7PM-7AM, please contact night-coverage  07/29/2021, 2:18 PM

## 2021-07-29 NOTE — Progress Notes (Signed)
Mobility Specialist Progress Note: ? ? 07/29/21 1504  ?Mobility  ?Activity Refused mobility  ? ?Pt sleeping upon arrival. Woke up and stated she's still in pain and doesn't want to sit in her chair. Will follow-up as time allows.  ? ?Danielle [name] ?Mobility Specialist ?Primary Phone [phone] ? ?

## 2021-07-29 NOTE — Progress Notes (Signed)
Mobility Specialist Progress Note: ? ? 07/29/21 1146  ?Mobility  ?Activity Refused mobility  ? ?Pt stating her stiches came out this morning and is in a lot of pain. Will follow-up as time allows.  ? ?Danielle [name] ?Mobility Specialist ?Primary Phone [phone] ? ?

## 2021-07-30 LAB — GLUCOSE, CAPILLARY
Glucose-Capillary: 77 mg/dL (ref 70–99)
Glucose-Capillary: 101 mg/dL — ABNORMAL HIGH (ref 70–99)
Glucose-Capillary: 122 mg/dL — ABNORMAL HIGH (ref 70–99)

## 2021-07-30 LAB — HEMOGLOBIN AND HEMATOCRIT, BLOOD
HCT: 25.5 % — ABNORMAL LOW (ref 36.0–46.0)
Hemoglobin: 7.3 g/dL — ABNORMAL LOW (ref 12.0–15.0)

## 2021-07-30 MED ORDER — HYDROMORPHONE HCL 1 MG/ML IJ SOLN
0.2500 mg | INTRAMUSCULAR | Status: DC | PRN
  Administered 2021-07-30 – 2021-08-01 (×3): 0.25 mg via INTRAVENOUS
  Filled 2021-07-30 (×3): qty 0.5

## 2021-07-30 NOTE — Progress Notes (Signed)
Mobility Specialist Progress Note: ? ? 07/30/21 1211  ?Mobility  ?Activity Transferred from bed to chair  ?Level of Assistance Minimal assist, patient does 75% or more  ?Assistive Device Front wheel walker  ?Distance Ambulated (ft) 2 ft  ?Activity Response Tolerated fair  ?$Mobility charge 1 Mobility  ? ?Patient received in bed willing to participate in mobility. MinA to stand from elevated bed. Let in chair with call bell in reach and al needs met.  ? ?Danielle [name] ?Mobility Specialist ?Primary Phone [phone] ? ?

## 2021-07-30 NOTE — Progress Notes (Signed)
Pt states she does not want to wear her CPAP.  ?

## 2021-07-30 NOTE — Plan of Care (Signed)

## 2021-07-30 NOTE — Progress Notes (Signed)
PROGRESS NOTE    Danielle [name]  DGL:[id number] DOB: 07-22-1945 DOA: 07/10/2021 PCP: Seward Carol, MD   Brief Narrative:  Danielle [name] is a 76 y.o. 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 She presented to the ED on 2/13 for evaluation of failed antibiotic treatment for right knee recurrent septic arthritis.   -December 2019, patient underwent right knee arthroplasty by Dr. Mardelle Matte  -November 2022, patient was seen in his office for severe acute pain and swelling of the right knee.  Right knee aspiration at that time was concerning for staph infection of her prosthetic knee joint.  She underwent I&D with liner exchange and was treated postoperatively with IV antibiotics.  She did well overall and was transitioned as an outpatient from the cefazolin to suppression with Keflex.  Evidently she ran out of Keflex approximately a week ago.  Also for the past 2 to 3 weeks, patient was getting progressively worsening pain and difficulty with ambulation and hence she was admitted on 2/13.   On 2/15, she had explant right total knee arthroplasty with implantation of articulating antibiotic spacer. Postoperatively, she was monitored in ICU and transferred to [facility] next day only to note that the hemoglobin had dropped from 9.2-4.1.  She was transferred back to ICU.  She is a Jehovah witness and in the past refusing blood transfusion.  While in ICU, she was lethargic.  Her daughter gave consent for blood products and hence she got 2 units of PRBC transfusion.  Subsequently patient woke up and then declined any further transfusion. Her hemoglobin has been running in low but stable range since then.     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  Patient underwent explant right total knee arthroplasty with implantation of articulating antibiotic spacer by Dr. Zachery Dakins on 07/12/2021.    -Infectious disease was consulted.  Patient was transitioned to IV cefazolin.  PICC line has been placed. -Last day of treatment will be 08/23/21.  OPAT orders placed by ID. -adjusted pain meds to q2 from q4   Acute on chronic anemia Jehovah witness refusing blood transfusion -Baseline hemoglobin between 8 and 9.   -Postoperatively, patient had a significant drop in hemoglobin from 9.2 to 4.1 on 2/16.   -While in ICU, patient was lethargic and confused.  Consent was obtained from daughter for blood transfusion. She was transfused 2 units of PRBC in the ICU.  CT abdomen pelvis did not show any evidence of retroperitoneal bleed. -Since 2/18, hemoglobin has been stable and gradually improving.  Hemoglobin 7 on last check 3/3, recheck 3/5 7.3-limit labs  2/2 jeh wit status and not wanting blood products She received of Aranesp on 2/25 and repeated 3/3.   Left upper extremity DVT History of left breast cancer Left upper extremity lymphedema -Patient has history of breast cancer with axillary lymph nodes resection after which she has chronic  left upper extremity lymphedema.  However this hospitalization, she states it is worse than usual.   -2/24, ultrasound duplex showed an acute DVT involving a single left brachial vein.   -2/27, I discussed the situation with patient's orthopedic Dr. Zachery Dakins and cardiologist Dr. Haroldine Laws.  Patient was on Eliquis prior to surgery as well for A-fib and did fine.  It was held during this hospitalization because of surgery and significant anemia.  She did not have any other source of bleeding other than intraoperative blood loss.  Hemoglobin gradually improving.  We all agreed that at this time, it is more reasonable to resume the patient on Eliquis.  Eliquis resumed. -Compression bandages on for left upper extremity lymphedema.   Uncontrolled type 2 diabetes mellitus -A1c 8.2 on 07/10/2021 -Currently blood sugars controlled Semglee 35 units daily and sliding scale insulin.  Continue to monitor. -Continue Neurontin for peripheral neuropathy   CAD/stents -Had a stent 3 times in the past, last in 2017. -Currently not having anginal symptoms. -Seen by cardiology on 2/14. -Continue Lipitor.  Eliquis resumed   PAF -Eliquis resumed   Chronic combined systolic and diastolic CHF History of hypertension -Last echo from December 2021 with EF 45 to 81%, grade 2 diastolic dysfunction, mildly elevated pulmonary artery systolic pressure -Home meds include Coreg 12.5 mg twice daily, torsemide 20 mg daily, losartan 25 mg daily and Aldactone 12.5 mg daily. -These meds were initially held because of low blood pressure and elevated creatinine.   -Later resumed on low-dose of Coreg and torsemide every other day.  Blood pressure and creatinine stable on those. Continue to hold losartan and Aldactone remain on hold.   -Continue to monitor heart rate and blood pressure, slowly improving   CKD stage IIIa -Creatinine remains at baseline. Continue to monitor.  Torsemide, losartan and Aldactone on hold.    Hypothyroidism -Synthroid   Morbid obesity  -Body mass index is 35.87 kg/m. Patient has been advised to make an attempt to improve diet and exercise patterns to aid in weight loss.   OSA -CPAP nightly, though staff reports pt refusing to wear    DVT prophylaxis: [id number]  Code Status: Full    Code Status Orders  (From admission, onward)           Start     Ordered   07/10/21 1254  Full code  Continuous        07/10/21 1253           Code Status History     Date Active Date Inactive Code Status Order ID Comments User Context   04/13/2021 1516 04/25/2021 0045 Full Code [id number]  Lequita Halt, MD ED   05/20/2018 1918 05/23/2018 1852 Full Code [id number]  Hosie Poisson, MD Inpatient   05/13/2018 1236 05/17/2018 1549 Full Code [id number]  Marchia Bond, MD Inpatient   02/03/2017 1341 02/06/2017 1821 Full Code [id number]  Truett Mainland, DO Inpatient      Advance Directive Documentation    Flowsheet Row Most Recent Value  Type of Advance Directive Living will  Pre-existing out of facility DNR order (yellow form or pink MOST form) --  "MOST" Form in Place? --      Family  Communication: pt today  Disposition Plan:   Anticipate SNF, initially denied, family appealing Consults called: None Admission status: Inpatient   Consultants:  ortho  Procedures:  CT ABDOMEN PELVIS WO CONTRAST  Result Date: 07/14/2021 CLINICAL DATA:  Retroperitoneal bleed suspected acute blood loss anemia, rule out retroperitoneal bleed EXAM: CT ABDOMEN AND PELVIS WITHOUT CONTRAST TECHNIQUE: Multidetector CT imaging of the abdomen and pelvis was performed following the standard protocol without IV contrast. RADIATION DOSE REDUCTION: This exam was performed according to the departmental dose-optimization program which includes automated exposure control, adjustment of the mA and/or kV according to patient size and/or use of iterative reconstruction technique. COMPARISON:  CT abdomen pelvis  12/04/2019. FINDINGS: Lower chest: No acute abnormality. Bibasilar dependent mild atelectasis. Mitral annular calcifications. Partially visualized dual chamber pacemaker leads. Hepatobiliary: No focal liver abnormality is seen. Prior cholecystectomy. Pancreas: Unremarkable. No pancreatic ductal dilatation or surrounding inflammatory changes. Spleen: Normal in size without focal abnormality. Adrenals/Urinary Tract: Adrenal glands are unremarkable. No hydronephrosis or nephrolithiasis. The bladder is decompressed with Foley catheter in place. Stomach/Bowel: There is a gastric diverticulum posteriorly. There is no evidence of bowel obstruction.The appendix is normal. No acute inflammatory process involving the bowel. Vascular/Lymphatic: Aortoiliac atherosclerotic calcifications. No AAA. No lymphadenopathy. Reproductive: Unchanged left adnexal calcification. Other: Fat containing umbilical hernia. Mild diastasis recti. There is no evidence of retroperitoneal hematoma. No abdominopelvic ascites. No free air. Musculoskeletal: No acute osseous abnormality. No suspicious lytic or blastic lesions. Bilateral hip osteoarthritis. Moderate severe changes of the pubic symphysis. Multilevel degenerative changes of the spine, worst at L4-L5 with grade 1 anterolisthesis. No suspicious lytic or blastic lesions. IMPRESSION: No acute abdominopelvic abnormality. No evidence of retroperitoneal or intraperitoneal hematoma. Bibasilar atelectasis, corresponding to opacities seen on chest radiograph acquired earlier today. Electronically Signed   By: Maurine Simmering M.D.   On: 07/14/2021 17:36   DG Knee 1-2 Views Right  Result Date: 07/29/2021 CLINICAL DATA:  Concern for infection of knee replacement. EXAM: RIGHT KNEE - 1-2 VIEW COMPARISON:  Postoperative knee radiographs-07/12/2021; 04/14/2021 FINDINGS: Stable sequela redo total knee replacement including removal of the initial radiopaque tibial plateau prosthetic hardware (with presumed  placement of a radiolucent antibiotic spacer) and exchange for a new radiopaque femoral component with persistent lucency about the anterior superior aspect of the new femoral component. No definitive evidence of hardware failure or loosening. No fracture or dislocation. There remains a small amount of scattered intra-articular postoperative air. Persistent adjacent soft tissue stranding. Extensive vascular calcifications. No radiopaque foreign body. 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 Electronically Signed   By: Sandi Mariscal M.D.   On: 07/29/2021 10:46   DG Chest Port 1 View  Result Date: 07/15/2021 CLINICAL DATA:  Respiration abnormal EXAM: PORTABLE CHEST 1 VIEW COMPARISON:  Radiograph 07/14/2021 FINDINGS: Unchanged cardiomediastinal silhouette with dual chamber pacemaker leads. Mildly increased interstitial opacities. No large pleural effusion. No visible pneumothorax. Bibasilar atelectasis. No acute osseous abnormality. IMPRESSION: Mildly increased diffuse interstitial opacities, possibly developing interstitial edema. Electronically Signed   By: Maurine Simmering M.D.   On: 07/15/2021 09:51   DG CHEST PORT 1 VIEW  Result Date: 07/14/2021 CLINICAL DATA:  Pneumonia EXAM: PORTABLE CHEST 1 VIEW COMPARISON:  Radiograph 07/10/2020 FINDINGS: Unchanged dual chamber pacemaker leads. Unchanged cardiomediastinal silhouette. Bibasilar opacities favored to be atelectasis. No new airspace disease. No large pleural effusion. No visible pneumothorax. No acute osseous abnormality. IMPRESSION: Bibasilar opacities favored to be atelectasis, developing infection possible. Recommend radiographic  follow-up. Electronically Signed   By: Maurine Simmering M.D.   On: 07/14/2021 12:01   DG Chest Port 1 View  Result Date: 07/10/2021 CLINICAL DATA:  CHF EXAM: PORTABLE CHEST 1 VIEW COMPARISON:  04/13/2021 FINDINGS: Left pacer remains in place, unchanged. Heart is  borderline in size. Aortic atherosclerosis. No confluent opacities, effusions or edema. No acute bony abnormality. IMPRESSION: No active disease. Electronically Signed   By: Rolm Baptise M.D.   On: 07/10/2021 19:13   DG Knee Right Port  Result Date: 07/12/2021 CLINICAL DATA:  Postoperative knee replacement. EXAM: PORTABLE RIGHT KNEE - 1-2 VIEW COMPARISON:  04/14/2021 FINDINGS: There is interval removal of the prior tibial plateau hardware and prior femoral condyle hardware. There is new cement related to new stem tunnels within the distal femur and proximal tibia. No metallic tibial prosthesis material is currently visualized. There is scattered bone density with chronic well corticated edges overlying the peripheral medial and lateral compartments. Severe patellofemoral joint space narrowing. Likely wound VAC drain overlies the proximal anterior knee. Mild soft tissue swelling and subcutaneous air consistent with recent surgery. Dense vascular calcifications. IMPRESSION: Interval recent revision of right knee arthroplasty hardware. No evidence of hardware failure. Electronically Signed   By: Yvonne Kendall M.D.   On: 07/12/2021 22:25   ECHOCARDIOGRAM COMPLETE  Result Date: 07/18/2021    ECHOCARDIOGRAM REPORT   Patient Name:   CATALAYA GARR Date of Exam: 07/18/2021 Medical Rec #:  [id number]     Height:       64.0 in Accession #:    [id number]    Weight:       217.2 lb Date of Birth:  10-19-1945     BSA:          2.026 m Patient Age:    11 years      BP:           101/42 mmHg Patient Gender: F             HR:           80 bpm. Exam Location:  Inpatient Procedure: 2D Echo, Cardiac Doppler and Color Doppler Indications:    I38 ENDOCARDITIS  History:        Patient has prior history of Echocardiogram examinations, most                 recent 04/29/2020. CHF, Signs/Symptoms:Dyspnea; Risk                 Factors:Hypertension and Diabetes. CAD / CHEMO.  Sonographer:    Beryle Beams Referring Phys: [phone] LINDSAY NICOLE  [place]  1. Left ventricular ejection fraction, by estimation, is 40 to 45%. The left ventricle has mildly decreased function. The left ventricle demonstrates regional wall motion abnormalities (see scoring diagram/findings for description). Left ventricular diastolic parameters are consistent with Grade II diastolic dysfunction (pseudonormalization). Elevated left atrial pressure.  2. Right ventricular systolic function is normal. The right ventricular size is normal.  3. Left atrial size was severely dilated.  4. The mitral valve is grossly normal. Mild to moderate mitral valve regurgitation. The mean mitral valve gradient is 5.5 mmHg with average heart rate of 82 bpm. Moderate mitral annular calcification.  5. Tricuspid valve regurgitation is moderate.  6. The aortic valve is tricuspid. There is mild calcification of the aortic valve. There is mild thickening of the aortic valve. Aortic valve regurgitation is mild. Aortic valve sclerosis/calcification is present, without any evidence of aortic stenosis.  7. The  inferior vena cava is dilated in size with <50% respiratory variability, suggesting right atrial pressure of 15 mmHg. Comparison(s): Prior images reviewed side by side. The left ventricular function is worsened. The left ventricular wall motion abnormality is worse. The distribution of the wall motion abnormality is the same, but the reduction in contractility is worse. FINDINGS  Left Ventricle: Left ventricular ejection fraction, by estimation, is 40 to 45%. The left ventricle has mildly decreased function. The left ventricle demonstrates regional wall motion abnormalities. The left ventricular internal cavity size was normal in size. There is borderline concentric left ventricular hypertrophy. Left ventricular diastolic parameters are consistent with Grade II diastolic dysfunction (pseudonormalization). Elevated left atrial pressure.  LV Wall Scoring: The apical septal segment, apical inferior  segment, and apex are akinetic. The apical anterior segment is hypokinetic. Right Ventricle: The right ventricular size is normal. Right vetricular wall thickness was not well visualized. Right ventricular systolic function is normal. Left Atrium: Left atrial size was severely dilated. Right Atrium: Right atrial size was normal in size. Pericardium: There is no evidence of pericardial effusion. Mitral Valve: The mitral valve is grossly normal. There is mild thickening of the mitral valve leaflet(s). Moderate mitral annular calcification. Mild to moderate mitral valve regurgitation, with centrally-directed jet. MV peak gradient, 69.2 mmHg. The mean mitral valve gradient is 5.5 mmHg with average heart rate of 82 bpm. Tricuspid Valve: The tricuspid valve is normal in structure. Tricuspid valve regurgitation is moderate. Aortic Valve: The aortic valve is tricuspid. There is mild calcification of the aortic valve. There is mild thickening of the aortic valve. Aortic valve regurgitation is mild. Aortic regurgitation PHT measures 460 msec. Aortic valve sclerosis/calcification is present, without any evidence of aortic stenosis. Aortic valve mean gradient measures 8.0 mmHg. Aortic valve peak gradient measures 12.1 mmHg. Aortic valve area, by VTI measures 1.32 cm. Pulmonic Valve: The pulmonic valve was grossly normal. Pulmonic valve regurgitation is not visualized. Aorta: The aortic root and ascending aorta are structurally normal, with no evidence of dilitation. Venous: The inferior vena cava is dilated in size with less than 50% respiratory variability, suggesting right atrial pressure of 15 mmHg. IAS/Shunts: No atrial level shunt detected by color flow Doppler.  LEFT VENTRICLE PLAX 2D LVIDd:         5.10 cm     Diastology LVIDs:         2.80 cm     LV e' medial:    4.05 cm/s LV PW:         1.30 cm     LV E/e' medial:  37.0 LV IVS:        1.10 cm     LV e' lateral:   5.03 cm/s LVOT diam:     1.90 cm     LV E/e' lateral:  29.8 LV SV:         49 LV SV Index:   24 LVOT Area:     2.84 cm  LV Volumes (MOD) LV vol d, MOD A2C: 78.4 ml LV vol d, MOD A4C: 89.4 ml LV vol s, MOD A2C: 26.9 ml LV vol s, MOD A4C: 38.5 ml LV SV MOD A2C:     51.5 ml LV SV MOD A4C:     89.4 ml LV SV MOD BP:      52.0 ml RIGHT VENTRICLE RV S prime:     12.90 cm/s TAPSE (M-mode): 2.0 cm LEFT ATRIUM  Index LA diam:        5.30 cm 2.62 cm/m LA Vol (A2C):   89.2 ml 44.03 ml/m LA Vol (A4C):   88.9 ml 43.88 ml/m LA Biplane Vol: 92.3 ml 45.56 ml/m  AORTIC VALVE                     PULMONIC VALVE AV Area (Vmax):    1.34 cm      PV Vmax:       0.60 m/s AV Area (Vmean):   1.21 cm      PV Vmean:      41.600 cm/s AV Area (VTI):     1.32 cm      PV VTI:        0.143 m AV Vmax:           174.00 cm/s   PV Peak grad:  1.4 mmHg AV Vmean:          132.000 cm/s  PV Mean grad:  1.0 mmHg AV VTI:            0.371 m AV Peak Grad:      12.1 mmHg AV Mean Grad:      8.0 mmHg LVOT Vmax:         82.50 cm/s LVOT Vmean:        56.300 cm/s LVOT VTI:          0.173 m LVOT/AV VTI ratio: 0.47 AI PHT:            460 msec  AORTA Ao Root diam: 2.60 cm Ao Asc diam:  2.60 cm MITRAL VALVE                TRICUSPID VALVE MV Area (PHT): 3.27 cm     TV Peak grad:   22.7 mmHg MV Area VTI:   0.34 cm     TV Mean grad:   16.0 mmHg MV Peak grad:  69.2 mmHg    TV Vmax:        2.38 m/s MV Mean grad:  5.5 mmHg     TV Vmean:       192.0 cm/s MV Vmax:       4.16 m/s     TV VTI:         0.72 msec MV Vmean:      361.0 cm/s MV Decel Time: 232 msec     SHUNTS MV E velocity: 150.00 cm/s  Systemic VTI:  0.17 m MV A velocity: 120.00 cm/s  Systemic Diam: 1.90 cm MV E/A ratio:  1.25 Mihai Croitoru MD Electronically signed by Sanda Klein MD Signature Date/Time: 07/18/2021/3:35:46 PM    Final    VAS [place] LOWER EXTREMITY VENOUS (DVT)  Result Date: 07/11/2021  Lower Venous DVT Study Patient Name:  CHELISE HANGER  Date of Exam:   07/11/2021 Medical Rec #: [id number]      Accession #:    [id number] Date of Birth:  11/02/1945      Patient Gender: F Patient Age:   19 years Exam Location[facility]  [facility] Procedure:      VAS [place] LOWER EXTREMITY VENOUS (DVT) Referring Phys: Wynetta Fines --------------------------------------------------------------------------------  Indications: Swelling.  Comparison Study: 04/13/21 prior Performing Technologist: Archie Patten RVS  Examination Guidelines: A complete evaluation includes B-mode imaging, spectral Doppler, color Doppler, and power Doppler as needed of all accessible portions of each vessel. Bilateral testing is considered an integral part of a complete examination. Limited examinations for reoccurring indications  may be performed as noted. The reflux portion of the exam is performed with the patient in reverse Trendelenburg.  +---------+---------------+---------+-----------+----------+---------------+  RIGHT     Compressibility Phasicity Spontaneity Properties Thrombus Aging   +---------+---------------+---------+-----------+----------+---------------+  CFV       Full            Yes       Yes                                     +---------+---------------+---------+-----------+----------+---------------+  SFJ       Full                                                              +---------+---------------+---------+-----------+----------+---------------+  FV Prox   Full                                                              +---------+---------------+---------+-----------+----------+---------------+  FV Mid    Full                                                              +---------+---------------+---------+-----------+----------+---------------+  FV Distal Full                                                              +---------+---------------+---------+-----------+----------+---------------+  PFV       Full                                                              +---------+---------------+---------+-----------+----------+---------------+  POP       Full             Yes       Yes                                     +---------+---------------+---------+-----------+----------+---------------+  PTV       Full                                                              +---------+---------------+---------+-----------+----------+---------------+  PERO  patent by color  +---------+---------------+---------+-----------+----------+---------------+   +----+---------------+---------+-----------+----------+--------------+  LEFT Compressibility Phasicity Spontaneity Properties Thrombus Aging  +----+---------------+---------+-----------+----------+--------------+  CFV  Full            Yes       Yes                                    +----+---------------+---------+-----------+----------+--------------+     Summary: RIGHT: - There is no evidence of deep vein thrombosis in the lower extremity.  - No cystic structure found in the popliteal fossa.  LEFT: - No evidence of common femoral vein obstruction.  *See table(s) above for measurements and observations. Electronically signed by Deitra Mayo MD on 07/11/2021 at 4:11:38 PM.    Final    VAS [place] UPPER EXTREMITY VENOUS DUPLEX  Result Date: 07/21/2021 UPPER VENOUS STUDY  Patient Name:  TRISTA CIOCCA  Date of Exam:   07/21/2021 Medical Rec #: [id number]      Accession #:    [id number] Date of Birth: 1946/01/05      Patient Gender: F Patient Age:   16 years Exam Location:  [facility] Procedure:      VAS [place] UPPER EXTREMITY VENOUS DUPLEX Referring Phys: Terrilee Croak --------------------------------------------------------------------------------  Indications: Edema Limitations: Poor ultrasound/tissue interface. Comparison Study: No prior study Performing Technologist: Maudry Mayhew MHA, RDMS, RVT, RDCS  Examination Guidelines: A complete evaluation includes B-mode imaging, spectral Doppler, color Doppler, and power Doppler as needed of all accessible portions of each  vessel. Bilateral testing is considered an integral part of a complete examination. Limited examinations for reoccurring indications may be performed as noted.  Right Findings: +----------+------------+---------+-----------+------------------------+-------+  RIGHT      Compressible Phasicity Spontaneous        Properties        Summary  +----------+------------+---------+-----------+------------------------+-------+  Subclavian                 Yes        Yes     Patent surrounding PICC                                                                    line                    +----------+------------+---------+-----------+------------------------+-------+  Left Findings: +----------+------------+---------+-----------+----------+-------+  LEFT       Compressible Phasicity Spontaneous Properties Summary  +----------+------------+---------+-----------+----------+-------+  IJV            Full        Yes        Yes                         +----------+------------+---------+-----------+----------+-------+  Subclavian     Full        Yes        Yes                         +----------+------------+---------+-----------+----------+-------+  Axillary       Full        Yes        Yes                         +----------+------------+---------+-----------+----------+-------+  Brachial       None                   No                  Acute   +----------+------------+---------+-----------+----------+-------+  Radial         Full                                               +----------+------------+---------+-----------+----------+-------+  Ulnar          Full                                               +----------+------------+---------+-----------+----------+-------+  Cephalic       Full                                               +----------+------------+---------+-----------+----------+-------+  Basilic        None                                       Acute    +----------+------------+---------+-----------+----------+-------+  Summary:  Right: No evidence of thrombosis in the subclavian.  Left: Findings consistent with acute deep vein thrombosis involving a single left brachial vein. Findings consistent with acute superficial vein thrombosis involving the left basilic vein.  *See table(s) above for measurements and observations.  Diagnosing physician: Monica Martinez MD Electronically signed by Monica Martinez MD on 07/21/2021 at 5:23:08 PM.    Final    [place] EKG SITE RITE  Result Date: 07/13/2021 If Site Rite image not attached, placement could not be confirmed due to current cardiac rhythm.   Antimicrobials:  IV ANCEF TILL 3/29    Subjective: Reports pain today, labile mood  Objective: Vitals:   07/29/21 1900 07/29/21 1955 07/29/21 1956 07/30/21 0500  BP:  (!) 100/36 (!) 124/42   Pulse:  74 71   Resp: '13 16 20   '$ Temp:  98.2 F (36.8 C) 98.2 F (36.8 C)   TempSrc:  Oral Oral   SpO2: 97% 98% 100%   Weight:    93.2 kg  Height:       No intake or output data in the 24 hours ending 07/30/21 1404 Filed Weights   07/28/21 0611 07/29/21 0354 07/30/21 0500  Weight: 94.8 kg 95.7 kg 93.2 kg    Examination:  General exam: Pleasant, elderly African-American female.  Not in physical distress Skin: No rashes, lesions or ulcers. HEENT: Atraumatic, normocephalic, no obvious bleeding Lungs: Clear to auscultation bilaterally CVS: Regular rate and rhythm, no murmur GI/Abd soft, nontender, nondistended, bowel sound present CNS: Alert, awake, oriented X 3 Psychiatry: tearful labile Extremities: Left upper extremity lymphedema persist.  Compression bandage on, wwp    Data Reviewed: I have personally reviewed following labs and imaging studies  CBC: Recent Labs  Lab 07/24/21 0325 07/26/21 0354 07/28/21 0359 07/30/21 0320  HGB 6.0* 6.9* 7.0* 7.3*  HCT 20.8* 23.1* 23.8* 40.9*   Basic Metabolic Panel: Recent  Labs  Lab 07/24/21 0325  07/26/21 0354 07/28/21 0359  CREATININE 1.28* 1.11* 1.08*   GFR: Estimated Creatinine Clearance: 50.6 mL/min (A) (by C-G formula based on SCr of 1.08 mg/dL (H)). Liver Function Tests: No results for input(s): AST, ALT, ALKPHOS, BILITOT, PROT, ALBUMIN in the last 168 hours. No results for input(s): LIPASE, AMYLASE in the last 168 hours. No results for input(s): AMMONIA in the last 168 hours. Coagulation Profile: No results for input(s): INR, PROTIME in the last 168 hours. Cardiac Enzymes: No results for input(s): CKTOTAL, CKMB, CKMBINDEX, TROPONINI in the last 168 hours. BNP (last 3 results) No results for input(s): PROBNP in the last 8760 hours. HbA1C: No results for input(s): HGBA1C in the last 72 hours. CBG: Recent Labs  Lab 07/29/21 1209 07/29/21 1638 07/29/21 2120 07/30/21 0742 07/30/21 1210  GLUCAP 100* 92 125* 77 101*   Lipid Profile: No results for input(s): CHOL, HDL, LDLCALC, TRIG, CHOLHDL, LDLDIRECT in the last 72 hours. Thyroid Function Tests: No results for input(s): TSH, T4TOTAL, FREET4, T3FREE, THYROIDAB in the last 72 hours. Anemia Panel: No results for input(s): VITAMINB12, FOLATE, FERRITIN, TIBC, IRON, RETICCTPCT in the last 72 hours. Sepsis Labs: No results for input(s): PROCALCITON, LATICACIDVEN in the last 168 hours.  No results found for this or any previous visit (from the past 240 hour(s)).       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 EXAM: RIGHT KNEE - 1-2 VIEW COMPARISON:  Postoperative knee radiographs-07/12/2021; 04/14/2021 FINDINGS: Stable sequela redo total knee replacement including removal of the initial radiopaque tibial plateau prosthetic hardware (with presumed placement of a radiolucent antibiotic spacer) and exchange for a new radiopaque femoral component with persistent lucency about the anterior superior aspect of the new femoral component. No  definitive evidence of hardware failure or loosening. No fracture or dislocation. There remains a small amount of scattered intra-articular postoperative air. Persistent adjacent soft tissue stranding. Extensive vascular calcifications. No radiopaque foreign body. 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 Electronically Signed   By: Sandi Mariscal M.D.   On: 07/29/2021 10:46        Scheduled Meds:  apixaban  5 mg Oral BID   atorvastatin  80 mg Oral QHS   carvedilol  3.125 mg Oral BID WC   Chlorhexidine Gluconate Cloth  6 each Topical Daily   empagliflozin  10 mg Oral Daily   ferrous sulfate  325 mg Oral BID WC   folic acid  1 mg Oral Daily   insulin aspart  0-15 Units Subcutaneous TID WC   insulin aspart  0-5 Units Subcutaneous QHS   insulin glargine-yfgn  35 Units Subcutaneous QHS   levothyroxine  75 mcg Oral Q0600   mouth rinse  15 mL Mouth Rinse BID   multivitamin with minerals  1 tablet Oral Daily   polyethylene glycol  17 g Oral Daily   senna  1 tablet Oral BID   sodium chloride flush  10-40 mL Intracatheter Q12H   thiamine  100 mg Oral Daily   torsemide  20 mg Oral QODAY   Continuous Infusions:  sodium chloride 250 mL (07/29/21 1146)    ceFAZolin (ANCEF) IV 2 g (07/30/21 1331)   lactated ringers 10 mL/hr at 07/22/21 1329     LOS: 20 days    Time spent: 35 min    Nicolette Bang, MD [facility]  If  7PM-7AM, please contact night-coverage  07/30/2021, 2:04 PM

## 2021-07-31 DIAGNOSIS — I82409 Acute embolism and thrombosis of unspecified deep veins of unspecified lower extremity: Secondary | ICD-10-CM | POA: Diagnosis present

## 2021-07-31 DIAGNOSIS — G4733 Obstructive sleep apnea (adult) (pediatric): Secondary | ICD-10-CM

## 2021-07-31 LAB — GLUCOSE, CAPILLARY
Glucose-Capillary: 136 mg/dL — ABNORMAL HIGH (ref 70–99)
Glucose-Capillary: 127 mg/dL — ABNORMAL HIGH (ref 70–99)
Glucose-Capillary: 109 mg/dL — ABNORMAL HIGH (ref 70–99)
Glucose-Capillary: 123 mg/dL — ABNORMAL HIGH (ref 70–99)

## 2021-07-31 NOTE — Progress Notes (Signed)
Mobility Specialist Progress Note  ? ? 07/31/21 1118  ?Mobility  ?Activity Ambulated with assistance in room  ?Level of Assistance Minimal assist, patient does 75% or more  ?Assistive Device Front wheel walker  ?RLE Weight Bearing PWB  ?RLE Partial Weight Bearing Percentage or Pounds 50  ?Distance Ambulated (ft) 16 ft  ?Activity Response Tolerated fair  ?$Mobility charge 1 Mobility  ? ?Pt received in chair and agreeable. Ambulate to door but declined going into hall. Rolled pt back in chair. Left with call bell in reach and MD present.  ? ?Danielle [name] ?Mobility Specialist  ?M.S. 5N: [phone]  ?

## 2021-07-31 NOTE — Progress Notes (Signed)
?PROGRESS NOTE ? ? ? ?Ambre Kobayashi  XVQ:[id number] DOB: [birth year] DOA: 07/10/2021 ?PCP: Seward Carol, MD  ? ? ?Brief Narrative:  ?76 year old female with past medical history of bilateral knee replacement, recent recurrent right knee septic arthritis, essential hypertension, coronary artery disease status post stent placement, combined chronic systolic and diastolic heart failure, CKD stage IIIa, history of left breast cancer, obstructive sleep apnea on CPAP, morbid obesity, gout, Jehovah's Witness presented to the hospital for failed antibiotic treatment of right knee recurrent septic arthritis.  Of note patient had initially undergone  right TKA with Dr. Mardelle Matte December 2019 with development of acute pain/swelling of the right knee in November 2022 in which she was admitted to the hospital and diagnosed with a staph infection in her prosthetic knee.  She underwent irrigation debridement with liner exchange on April 14, 2021 and postoperatively treated with IV antibiotics with cefazolin with transition to Keflex thereafter.  On this presentation patient was initially admitted to the intensive care unit.  Patient was subsequently transferred to hospital service on 07/14/2021 but was noted to have acute drop in hemoglobin and was transferred back to the ICU.  Patient however received blood products in the ICU for 1 time.  Patient was then stabilized and was transferred back to medical service.  ? ?  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  Patient underwent explant right total knee arthroplasty with implantation of articulating antibiotic spacer by Dr. Zachery Dakins on 07/12/2021.   Infectious disease was consulted.  Patient has been transitioned cefazolin.  Has PICC line.  Last day of antibiotic will be 08/23/21.  OPAT orders placed by ID. ? ?Acute blood loss  anemia ?Jehovah witness refusing blood transfusion ?Patient did have a significant drop in hemoglobin initially was in the ICU and received blood transfusion.  Initially thought to be secondary to acute blood loss after surgery.  Baseline hemoglobin between 8-9.  Patient received darbepoetin on 07/14/2021.  Continue iron supplementation.   ?CT abdomen pelvis did not show any retroperitoneal hemorrhage.  Latest hemoglobin of 7.3 on 07/30/2021.  Trying to avoid excessive blood draws check hemoglobin in AM. ? ? ?DVT (deep venous thrombosis) ([place]) ?Left upper extremity DVT ?History of left breast cancer ?Left upper extremity lymphedema ?History of breast cancer with axillary lymph node resection.  Ultrasound on 07/21/2021 showed DVT of the left brachial vein.  Patient was on Eliquis prior to surgery for atrial fibrillation.  This admission patient had significant anemia.  Eliquis has been resumed after discussing with orthopedics and cardiologist. ? ?CKD (chronic kidney disease), stage III ([place]) ?Creatinine on 07/26/2021 was 1.1 and was stable.  Trying to limit blood draws. ? ?Paroxysmal atrial fibrillation ([place]) ?Patient on carvedilol 12.5 mg p.o. twice daily and Eliquis at home.  Eliquis has been resumed at this time. ? ? ?Type 2 diabetes mellitus with diabetic polyneuropathy, with long-term current use of insulin ([place]) ?HbA1c 8.2.  Remains on SSI and glargine insulin.  Monitor CBGs.   ? 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 ?2D echocardiogram with LV ejection fraction of 45 to 50% from echocardiogram 2021.  Patient is on torsemide 40 mg p.o. daily, spironolactone 12.5 mg p.o. daily, losartan 25 mg p.o. daily, carvedilol 12.5 mg p.o. twice daily.  Continue Coreg and torsemide at this time. ? ?Obesity (BMI 30-39.9) ?Estimated body mass index is 37.27 kg/m? as  calculated from the following: ?  Height as of this encounter: '5\' 4"'$  (1.626 m). ?  Weight as of this encounter: 98.5 kg. ?Would  benefit from weight loss as outpatient. ? ?OSA (obstructive sleep apnea) ?Continue CPAP at nighttime ? ?Hypothyroidism ?Continue Synthroid. ? ?Essential hypertension ?Antihypertensives on hold due to hypotension.  Continue to monitor for now.   ? ? ? DVT prophylaxis: SCDs Start: 07/10/21 1254 ?apixaban (ELIQUIS) tablet 5 mg  ? ?Code Status:   ?  Code Status: Full Code ? ?Disposition: Likely Home with home health if SNF denied. ? ?Status is: Inpatient ? ?Remains inpatient appropriate because: Pain management, uncertain disposition,  ? ? Family Communication: I spoke with the patient's daughter Ms Varney Biles on the phone and updated her about the clinical condition of the patient.  ? ?Consultants:  ?Orthopedics ? ?Procedures:  ?PRBC transfusion. ?Explant right total knee arthroplasty with implantation of articulating antibiotic spacer by Dr. Zachery Dakins on 07/12/2021.  ? ?Antimicrobials:  ?IV Ancef till 3/29. ? ?Anti-infectives (From admission, onward)  ? ? Start     Dose/Rate Route Frequency Ordered Stop  ? 07/12/21 1916  tobramycin (NEBCIN) powder  Status:  Discontinued       ?   As needed 07/12/21 1917 07/12/21 2124  ? 07/12/21 1915  vancomycin (VANCOCIN) powder  Status:  Discontinued       ?   As needed 07/12/21 1915 07/12/21 2124  ? 07/12/21 1745  vancomycin (VANCOCIN) IVPB 1000 mg/200 mL premix       ? 1,000 mg ?200 mL/hr over 60 Minutes Intravenous  Once 07/12/21 1741 07/12/21 1759  ? 07/12/21 1710  vancomycin (VANCOCIN) 1-5 GM/200ML-% IVPB       ?Note to Pharmacy: Hoy Morn E: cabinet override  ?    07/12/21 1710 07/12/21 1742  ? 07/10/21 1400  ceFAZolin (ANCEF) IVPB 2g/100 mL premix       ? 2 g ?200 mL/hr over 30 Minutes Intravenous Every 8 hours 07/10/21 1258 08/23/21 2359  ? ?  ? ? ?Subjective: ? ?Today, patient was seen and examined at bedside.  Patient states that she does have a lot of pain in the right knee area.  Did try to move with mobility specialist this morning.  Feels frustrated that IV medication  does not last long enough. ? ?Objective: ?Vitals:  ? 07/30/21 1700 07/30/21 1934 07/31/21 0500 07/31/21 0853  ?BP: (!) 90/49 110/80  (!) 133/41  ?Pulse:  74  78  ?Resp: '17 20  18  '$ ?Temp:  97.9 ?F (36.6 ?C)  98 ?F (36.7 ?C)  ?TempSrc:  Oral    ?SpO2:  97%  99%  ?Weight:   93.6 kg   ?Height:      ? ? ?Intake/Output Summary (Last 24 hours) at 07/31/2021 1448 ?Last data filed at 07/31/2021 0900 ?Gross per 24 hour  ?Intake 240 ml  ?Output --  ?Net 240 ml  ? ?Filed Weights  ? 07/29/21 0354 07/30/21 0500 07/31/21 0500  ?Weight: 95.7 kg 93.2 kg 93.6 kg  ? ?Body mass index is 35.42 kg/m?.  ? ?Physical Examination: ? ?General: Obese built, not in obvious distress ?HENT:   No scleral pallor or icterus noted. Oral mucosa is moist.  ?Chest:  Clear breath sounds.  Diminished breath sounds bilaterally. No crackles or wheezes.  ?CVS: S1 &S2 heard. No murmur.  Regular rate and rhythm. ?Abdomen: Soft, nontender, nondistended.  Bowel sounds are heard.   ?Extremities: No cyanosis, clubbing, left upper extremity lymphedema.  Peripheral pulses  are palpable.  Right knee on a knee brace. ?Psych: Alert, awake and oriented, labile mood ?CNS:  No cranial nerve deficits.  Power equal in all extremities.   ?Skin: Warm and dry.  No rashes noted. ? ?Data Reviewed:  ? ?CBC: ?Recent Labs  ?Lab 07/26/21 ?0354 07/28/21 ?0359 07/30/21 ?0320  ?HGB 6.9* 7.0* 7.3*  ?HCT 23.1* 23.8* 25.5*  ? ? ?Basic Metabolic Panel: ?Recent Labs  ?Lab 07/26/21 ?0354 07/28/21 ?7412  ?CREATININE 1.11* 1.08*  ? ? ?Liver Function Tests: ?No results for input(s): AST, ALT, ALKPHOS, BILITOT, PROT, ALBUMIN in the last 168 hours. ? ? ?Radiology Studies: ?No results found. ? ? ? LOS: 21 days  ? ? ?Flora Lipps, MD ?[facility] ?07/31/2021, 2:48 PM  ?  ?

## 2021-07-31 NOTE — Progress Notes (Signed)
Physical Therapy Treatment ?Patient Details ?Name: Danielle [name] ?MRN: [id number] ?DOB: [birth year] ?Today's Date: 07/31/2021 ? ? ?History of Present Illness Patient is a 76 y/o female admitted due to R TKA infection.  Initially had TKA 04/2018 then had pain/swelling 03/2021 with I&D and liner exchange and IV antibotics.  Admitted with worse pain and difficulty ambulating with fevers.  S/p R explant TKA w/implantation of articulating antibiotic spacer on 07/12/2021.  Post op had severe anemia with transfer to ICU due to [facility] witness and refusing blood products.  Did finally have transfusion 2/17 with hemoglobin at 5.0 after transfusion.  PMH positive for HFpEF, CAD w/ multiple prior PCIs, SSS s/p PPM, Afib on AC, OSA on CPAP, CKD, HTN, DM. ? ?  ?PT Comments  ? ? Pt initially slow to warm to PT, once PT explained purpose is to help pt progress mobility pt agreeable. Pt tolerated R LE exercises to promote gentle ROM, strengthening, and circulation. Pt declines out of chair mobility, states "I won't hurt now, I will hurt at 2:30 in the morning". PT explained pt is to wear the KI when walking, but can remove it while in bed which per pt she has been in [place] all day. Pt evidently now plans to d/c home with support of daughter, will need to maximize home services to safely transition home.  ?  ?Recommendations for follow up therapy are one component of a multi-disciplinary discharge planning process, led by the attending physician.  Recommendations may be updated based on patient status, additional functional criteria and insurance authorization. ? ?Follow Up Recommendations ? Skilled nursing-short term rehab (<3 hours/day) (versus home with maximized home services, per pt pt's daughter now taking her home) ?  ?  ?Assistance Recommended at Discharge Frequent or constant Supervision/Assistance  ?Patient can return home with the following A lot of help with walking and/or transfers;A lot of help with  bathing/dressing/bathroom;Direct supervision/assist for medications management;Direct supervision/assist for financial management;Help with stairs or ramp for entrance;Assist for transportation ?  ?Equipment Recommendations ? None recommended by PT  ?  ?Recommendations for Other Services   ? ? ?  ?Precautions / Restrictions Precautions ?Precautions: Fall;Knee ?Required Braces or Orthoses: Knee Immobilizer - Right ?Knee Immobilizer - Right: On when out of bed or walking;Other (comment) (okay to remove during therapy) ?Restrictions ?RLE Weight Bearing: Partial weight bearing ?RLE Partial Weight Bearing Percentage or Pounds: 50  ?  ? ?Mobility ? Bed Mobility ?Overal bed mobility: Needs Assistance ?  ?  ?  ?  ?  ?  ?General bed mobility comments: up in recliner ?  ? ?Transfers ?Overall transfer level: Needs assistance ?  ?  ?  ?  ?  ?  ?  ?  ?General transfer comment: pt declines out of chair transfer ?  ? ?Ambulation/Gait ?  ?  ?  ?  ?  ?  ?  ?  ? ? ?Stairs ?  ?  ?  ?  ?  ? ? ?Wheelchair Mobility ?  ? ?Modified Rankin (Stroke Patients Only) ?  ? ? ?  ?Balance Overall balance assessment: Needs assistance ?  ?Sitting balance-Leahy Scale: Fair ?  ?  ?  ?  ?  ?  ?  ?  ?  ?  ?  ?  ?  ?  ?  ?  ?  ? ?  ?Cognition Arousal/Alertness: Awake/alert ?Behavior During Therapy: [facility] for tasks assessed/performed ?Overall Cognitive Status: Impaired/Different from baseline ?Area of Impairment: Attention, Following commands, Safety/judgement ?  ?  ?  ?  ?  ?  ?  ?  ?  ?  Current Attention Level: Focused, Sustained ?  ?Following Commands: Follows one step commands with increased time, Follows one step commands inconsistently ?Safety/Judgement: Decreased awareness of safety, Decreased awareness of deficits ?  ?  ?General Comments: pt very slow to warm to PT staff, difficult to motivate ?  ?  ? ?  ?Exercises General Exercises - Lower Extremity ?Ankle Circles/Pumps: AROM, Both, 20 reps, Seated ?Quad Sets: AROM, Right, 10 reps, Seated ?Heel  Slides: AAROM, Right, 15 reps, Seated ?Hip ABduction/ADduction: AROM, Right, 5 reps, Seated ? ?  ?General Comments   ?  ?  ? ?Pertinent Vitals/Pain Pain Assessment ?Pain Assessment: Faces ?Faces Pain Scale: Hurts little more ?Pain Location: R knee ?Pain Descriptors / Indicators: Grimacing, Guarding, Moaning ?Pain Intervention(s): Limited activity within patient's tolerance, Monitored during session, Repositioned  ? ? ?Home Living   ?  ?  ?  ?  ?  ?  ?  ?  ?  ?   ?  ?Prior Function    ?  ?  ?   ? ?PT Goals (current goals can now be found in the care plan section) Acute Rehab PT Goals ?PT Goal Formulation: With patient/family ?Time For Goal Achievement: 08/14/21 ?Potential to Achieve Goals: Fair ?Progress towards PT goals: Progressing toward goals ? ?  ?Frequency ? ? ? Min 4X/week ? ? ? ?  ?PT Plan Current plan remains appropriate  ? ? ?Co-evaluation   ?  ?  ?  ?  ? ?  ?AM-PAC PT "6 Clicks" Mobility   ?Outcome Measure ? Help needed turning from your back to your side while in a flat bed without using bedrails?: A Lot ?Help needed moving from lying on your back to sitting on the side of a flat bed without using bedrails?: A Lot ?Help needed moving to and from a bed to a chair (including a wheelchair)?: A Lot ?Help needed standing up from a chair using your arms (e.g., wheelchair or bedside chair)?: A Lot ?Help needed to walk in hospital room?: A Lot ?Help needed climbing 3-5 steps with a railing? : Total ?6 Click Score: 11 ? ?  ?End of Session Equipment Utilized During Treatment: Right knee immobilizer (removed during session for exercise) ?Activity Tolerance: Patient tolerated treatment well ?Patient left: in chair;with call bell/phone within reach;with nursing/sitter in room ?Nurse Communication: Mobility status ?PT Visit Diagnosis: Difficulty in walking, not elsewhere classified (R26.2);Pain;Muscle weakness (generalized) (M62.81) ?Pain - Right/Left: Right ?Pain - part of body: Knee ?  ? ? ?Time: 2025-4270 ?PT Time  Calculation (min) (ACUTE ONLY): 32 min ? ?Charges:  $Therapeutic Exercise: 8-22 mins          ?          ? ?Stacie Glaze, PT DPT ?Acute Rehabilitation Services ?Pager [phone]  ?Office [phone] ? ? ? ?Adrielle Polakowski E [place] ?07/31/2021, 4:05 PM ? ?

## 2021-07-31 NOTE — Assessment & Plan Note (Signed)
Body mass index is 35.42 kg/m?Marland KitchenMarland Kitchen  Would benefit from weight loss as outpatient. ?

## 2021-07-31 NOTE — Assessment & Plan Note (Addendum)
Left upper extremity DVT ?History of left breast cancer ?Left upper extremity lymphedema ?History of breast cancer with axillary lymph node resection.  Ultrasound on 07/21/2021 showed DVT of the left brachial vein.  Patient was on Eliquis prior to surgery for atrial fibrillation.  This admission patient had significant anemia.  Eliquis has been resumed after discussing with orthopedics and cardiologist.  This will be continued on discharge.  Hemoglobin has been stable ?

## 2021-07-31 NOTE — Progress Notes (Signed)
Patient refused CPAP at this time. Equipment set up at bedside if needed. ?

## 2021-07-31 NOTE — Assessment & Plan Note (Signed)
Continue CPAP at nighttime ?

## 2021-07-31 NOTE — Assessment & Plan Note (Addendum)
Jehovah witness refusing blood transfusion ?.  Baseline hemoglobin between 8-9.  Patient received darbepoetin on 07/14/2021.  Continue iron supplementation on discharge. CT abdomen pelvis did not show any retroperitoneal hemorrhage.  Latest hemoglobin of 8.5  on 07/30/2021.  Blood draws were conservative during hospitalization to save blood. ?

## 2021-08-01 LAB — HEMOGLOBIN AND HEMATOCRIT, BLOOD
HCT: 29.6 % — ABNORMAL LOW (ref 36.0–46.0)
Hemoglobin: 8.5 g/dL — ABNORMAL LOW (ref 12.0–15.0)

## 2021-08-01 LAB — GLUCOSE, CAPILLARY
Glucose-Capillary: 95 mg/dL (ref 70–99)
Glucose-Capillary: 112 mg/dL — ABNORMAL HIGH (ref 70–99)
Glucose-Capillary: 131 mg/dL — ABNORMAL HIGH (ref 70–99)
Glucose-Capillary: 93 mg/dL (ref 70–99)

## 2021-08-01 MED ORDER — OXYCODONE HCL 5 MG PO TABS
10.0000 mg | ORAL_TABLET | ORAL | Status: DC | PRN
  Administered 2021-08-02 (×2): 10 mg via ORAL
  Filled 2021-08-01 (×2): qty 2

## 2021-08-01 MED ORDER — HYDROMORPHONE HCL 1 MG/ML IJ SOLN
0.5000 mg | INTRAMUSCULAR | Status: DC | PRN
  Administered 2021-08-01 – 2021-08-02 (×2): 0.5 mg via INTRAVENOUS
  Filled 2021-08-01 (×2): qty 0.5

## 2021-08-01 NOTE — TOC Progression Note (Signed)
Transition of Care (TOC) - Progression Note  ? ? ?Patient Details  ?Name: Danielle [name] ?MRN: [id number] ?Date of Birth: Jun 06, 1945 ? ?Transition of Care (TOC) CM/SW Contact  ?Joanne Chars, LCSW ?Phone Number: ?08/01/2021, 2:58 PM ? ?Clinical Narrative:   Case discussed in QC, decision to request that [place] submit another auth with emphasis on IV abx as another skilled need.   CSW spoke with Shirlee Limerick at [facility], updated her that no appeal was ever filed by the family to the prior authorization denial.  She agreed to [employer] request, requested she highlight IV abx.  Updated PT/OT notes forwarded to [facility].  ? ? ? ?Expected Discharge Plan: [place] ?Barriers to Discharge: Continued Medical Work up, SNF Pending bed offer ? ?Expected Discharge Plan and Services ?Expected Discharge Plan: [place] ?In-house Referral: Clinical Social Work ?Discharge Planning Services: CM Consult ?Post Acute Care Choice: [place] ?Living arrangements for the past 2 months: Apartment ?                ?  ?  ?  ?  ?  ?HH Arranged: RN, PT, OT, Social Work ?[place] Agency: [facility] ?Date HH Agency Contacted: 08/01/21 ?Time [place]: [id number] ?Representative spoke with at [place]: Levada Dy ? ? ?Social Determinants of Health (SDOH) Interventions ?  ? ?Readmission Risk Interventions ?No flowsheet data found. ? ?

## 2021-08-01 NOTE — Plan of Care (Signed)

## 2021-08-01 NOTE — Progress Notes (Signed)
Patient refused CPAP for the night  

## 2021-08-01 NOTE — Progress Notes (Signed)
Pharmacy Antibiotic Note ? ?Danielle [name] is a 76 y.o. female admitted on 07/10/2021 with  prosthetic joint infection .  Pharmacy  ?consulted on 07/10/21 for cefazolin dosing for right knee prosthetic joint infection. Started on Cefazolin 2 g IV q8h.   ?S/p explant and abx spacer 07/12/21 with orthopedics.  ?R. knee wound cultures: MSSA infection. ? ?CKD stage IIIa, SCr is stable at 1.08 on 07/28/21.  MD noted trying to limit blood draws. ?ID plan: Cefazolin for 6 weeks from OR date, end date 08/23/21.  Has PICC line. ? ?Plan: ?Continue Cefazolin 2 grams IV q8h  (End date 08/23/21) ?Monitor clinical status, renal function and culture results. ? OPAT orders entered by ID pharmacist.  ? ?Height: '5\' 4"'$  (162.6 cm) ?Weight: 93.8 kg (206 lb 12.7 oz) ?IBW/kg (Calculated) : 54.7 ? ?Temp (24hrs), Avg:98.2 ?F (36.8 ?C), Min:98 ?F (36.7 ?C), Max:98.4 ?F (36.9 ?C) ? ?Recent Labs  ?Lab 07/26/21 ?0354 07/28/21 ?2725  ?CREATININE 1.11* 1.08*  ? ?  ?Estimated Creatinine Clearance: 50.7 mL/min (A) (by C-G formula based on SCr of 1.08 mg/dL (H)).   ? ?Allergies  ?Allergen Reactions  ? Other Other (See Comments)  ?  Blood Product Refusal (Jehovah's witness) ? ?  ? Tape Other (See Comments)  ?  Leaves blisters and marks on the skin  ? Sulfa Antibiotics Rash  ? Uloric [Febuxostat] Rash  ? ? ?Antimicrobials this admission: ?2/13 cefazolin >>   (end date 08/23/21) ? ?Dose adjustments this admission: ?No adjustments at this time ? ?Microbiology results: ?2/13 R knee asp cx (outpt ortho clinic): MSSA ?2/14 MRSA PCR: negative ?2/15 R knee cx x 3: MSSA ? 2/16 MRSA PCR: negative ?2/17 BCx x2:  negative ? ? ?Thank you for allowing [place] to participate in this patients care. ?Nicole Cella, RPh ?Clinical Pharmacist ?08/01/2021 10:04 AM ? ?**Pharmacist phone directory can be found on [email or web address] listed under [place]** ? ? ?

## 2021-08-01 NOTE — Plan of Care (Signed)

## 2021-08-01 NOTE — Progress Notes (Addendum)
Inpatient Rehab Admissions Coordinator:  ? ?Screened pt per request of TOC team.  Note that [facility] has already denied SNF due to decreased tolerance and lack of participation with therapy.  Note only seeing PT and mobility tech, and refused mobility with PT yesterday.  Would need to see pt work with PT and OT, and see what their recommendations are, before I could make an accurate assessment on whether she may be a candidate for CIR.   ? ?Addendum: discussed with PT who feels pt will not be able to tolerate 3 hrs/day of therapy and make reasonable gains with a short admission.  Would not recommend CIR consult at this time.  ? ?Shann Medal, PT, DPT ?Admissions Coordinator ?[phone] ?08/01/21  ?11:02 AM ? ?

## 2021-08-01 NOTE — TOC Progression Note (Addendum)
Transition of Care (TOC) - Progression Note  ? ? ?Patient Details  ?Name: Danielle [name] ?MRN: [id number] ?Date of Birth: 1945-09-16 ? ?Transition of Care (TOC) CM/SW Contact  ?Sharin Mons, RN ?Phone Number: ?08/01/2021, 10:32 AM ? ?Clinical Narrative:    ?NCM spoke with pt and pr's daughter Varney Biles @ bedside regarding d/c planning. Pt with SNF denial. Pt/ daughter's preference is for  SNF placement.  However, NCM made aware SNF appeal never submitted by family. NCM shared PT's recent evaluation. Pt declined out chair mobility with PT.  Limited ambulation. Pt states she has been moving more with mobility team. States would like to go home. ? ?Per daughter she will be able to take pt home on Friday with home health services in place.PTA pt was active with [facility] and pt would like to continue using them. ?Referral made with [facility] ([facility] and accepted.Nanine Means stated services did end prior to this hospitalization.Therefore, home health orders and  F2F will be needed from MD will be needed for services( RN,PT,OT,SW). ? ?TOC team following and will asisst with needs.... ? ?08/01/2021 1100 Secured chat received from MD. Daughter now inquiring about CIR FOR pt . NCM spoke with CIR admission liaison regarding case.  Liaison to review and place note, ? ?Expected Discharge Plan: [place] ?Barriers to Discharge: Continued Medical Work up, SNF Pending bed offer ? ?Expected Discharge Plan and Services ?Expected Discharge Plan: [place] ?In-house Referral: Clinical Social Work ?Discharge Planning Services: CM Consult ?Post Acute Care Choice: [place] ?Living arrangements for the past 2 months: Apartment ?                ?  ?  ?  ?  ?  ?HH Arranged: RN, PT, OT, Social Work ?[place] Agency: [facility] ?Date HH Agency Contacted: 08/01/21 ?Time [place]: 2409 ?Representative spoke with at [place]: Levada Dy ? ? ?Social Determinants of Health (SDOH) Interventions ?   ? ?Readmission Risk Interventions ?No flowsheet data found. ? ?

## 2021-08-01 NOTE — Progress Notes (Signed)
?PROGRESS NOTE ? ? ? ?Danielle [name]  ERX:[id number] DOB: 1946-01-05 DOA: 07/10/2021 ?PCP: Seward Carol, MD  ? ? ?Brief Narrative:  ?76 year old female with past medical history of bilateral knee replacement, recent recurrent right knee septic arthritis, essential hypertension, coronary artery disease status post stent placement, combined chronic systolic and diastolic heart failure, CKD stage IIIa, history of left breast cancer, obstructive sleep apnea on CPAP, morbid obesity, gout, Jehovah's Witness presented to the hospital for failed antibiotic treatment of right knee recurrent septic arthritis.  Of note patient had initially undergone  right TKA with Dr. Mardelle Matte December 2019 with development of acute pain/swelling of the right knee in November 2022 in which she was admitted to the hospital and diagnosed with a staph infection in her prosthetic knee.  She underwent irrigation debridement with liner exchange on April 14, 2021 and postoperatively treated with IV antibiotics with cefazolin with transition to Keflex thereafter.  On this presentation, patient was initially admitted to the intensive care unit.  Patient was subsequently transferred to hospital service on 07/14/2021 but was noted to have acute drop in hemoglobin and was transferred back to the ICU.  Patient however received blood products in the ICU for 1 time.  Patient was then stabilized and was transferred back to medical service.  At this time pending disposition plan.  ? ?  ?Assessment and Plan: ?* Septic Arthritis, Infection of prosthetic right knee joint ([place]), MSSA infection ?Failed outpatient antibiotics for recurrent right TKA prosthetic infection with MSSA.  Patient underwent explant right total knee arthroplasty with implantation of articulating antibiotic spacer by Dr. Zachery Dakins on 07/12/2021.   Infectious disease was consulted.  Patient has been transitioned cefazolin.  Has PICC line.  Last day of antibiotic will be 08/23/21.  OPAT orders  placed by ID. ? ?Acute blood loss anemia ?Jehovah witness refusing blood transfusion ?Patient did have a significant drop in hemoglobin initially was in the ICU and received blood transfusion.  Initially thought to be secondary to acute blood loss after surgery.  Baseline hemoglobin between 8-9.  Patient received darbepoetin on 07/14/2021.  Continue iron supplementation.   ?CT abdomen pelvis did not show any retroperitoneal hemorrhage.  Latest hemoglobin of 7.3 on 07/30/2021.  Trying to avoid excessive blood draws.  Check H&H periodically. ? ?DVT (deep venous thrombosis) ([place]) ?Left upper extremity DVT ?History of left breast cancer ?Left upper extremity lymphedema ?History of breast cancer with axillary lymph node resection.  Ultrasound on 07/21/2021 showed DVT of the left brachial vein.  Patient was on Eliquis prior to surgery for atrial fibrillation.  This admission patient had significant anemia.  Eliquis has been resumed after discussing with orthopedics and cardiologist. ? ?CKD (chronic kidney disease), stage III ([place]) ?Creatinine on 07/26/2021 was 1.1 and was stable.  Trying to limit blood draws. ? ?Paroxysmal atrial fibrillation ([place]) ?Patient on carvedilol 12.5 mg p.o. twice daily and Eliquis at home.  Eliquis has been resumed at this time. ? ? ?Type 2 diabetes mellitus with diabetic polyneuropathy, with long-term current use of insulin ([place]) ?HbA1c 8.2.  Remains on SSI and glargine insulin.  Monitor CBGs.  Latest POC glucose of 95. ? 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 ?2D echocardiogram with LV ejection fraction of 45 to 50% from echocardiogram 2021.  Patient is on torsemide 40 mg p.o. daily, spironolactone 12.5 mg p.o. daily, losartan 25 mg p.o. daily, carvedilol 12.5 mg p.o. twice daily.  Continue Coreg and torsemide at this time. ? ?Obesity (  BMI 30-39.9) ?Estimated body mass index is 37.27 kg/m? as calculated from the following: ?  Height as of this encounter: '5\' 4"'$   (1.626 m). ?  Weight as of this encounter: 98.5 kg. ?Patient would benefit from weight loss as outpatient. ? ?OSA (obstructive sleep apnea) ?Continue CPAP at nighttime ? ?Hypothyroidism ?Continue Synthroid. ? ?Essential hypertension ?  Continue to monitor for now.  On Coreg and torsemide. ? ? ? DVT prophylaxis: SCDs Start: 07/10/21 1254 ?apixaban (ELIQUIS) tablet 5 mg  ? ?Code Status:   ?  Code Status: Full Code ? ?Disposition: Likely Home with home health ? ?Status is: Inpatient ? ?Remains inpatient appropriate because: Pain management, disposition issue. ? ? Family Communication:  ?I spoke with the patient's daughter Ms Varney Biles on the phone and updated her about the clinical condition of the patient on 07/31/2021..  ? ?Consultants:  ?Orthopedics ? ?Procedures:  ?PRBC transfusion. ?Explant right total knee arthroplasty with implantation of articulating antibiotic spacer by Dr. Zachery Dakins on 07/12/2021.  ? ?Antimicrobials:  ?IV Ancef till 08/23/21. ? ?Anti-infectives (From admission, onward)  ? ? Start     Dose/Rate Route Frequency Ordered Stop  ? 07/12/21 1916  tobramycin (NEBCIN) powder  Status:  Discontinued       ?   As needed 07/12/21 1917 07/12/21 2124  ? 07/12/21 1915  vancomycin (VANCOCIN) powder  Status:  Discontinued       ?   As needed 07/12/21 1915 07/12/21 2124  ? 07/12/21 1745  vancomycin (VANCOCIN) IVPB 1000 mg/200 mL premix       ? 1,000 mg ?200 mL/hr over 60 Minutes Intravenous  Once 07/12/21 1741 07/12/21 1759  ? 07/12/21 1710  vancomycin (VANCOCIN) 1-5 GM/200ML-% IVPB       ?Note to Pharmacy: Hoy Morn E: cabinet override  ?    07/12/21 1710 07/12/21 1742  ? 07/10/21 1400  ceFAZolin (ANCEF) IVPB 2g/100 mL premix       ? 2 g ?200 mL/hr over 30 Minutes Intravenous Every 8 hours 07/10/21 1258 08/23/21 2359  ? ?  ? ? ?Subjective: ? ?Today, patient was seen and examined at bedside.  Does not complain of pain when in bed.  Did not sleep that well in the night and was sleeping this morning.  Denies any  nausea vomiting fever chills  ? ?Objective: ?Vitals:  ? 07/31/21 1448 07/31/21 2000 08/01/21 0202 08/01/21 0816  ?BP: 100/60 (!) 128/45  (!) 103/39  ?Pulse: 70 66    ?Resp: 17 20    ?Temp: 98 ?F (36.7 ?C) 98.2 ?F (36.8 ?C)  98.4 ?F (36.9 ?C)  ?TempSrc: Oral Oral  Oral  ?SpO2: 99% 99%    ?Weight:   93.8 kg   ?Height:      ? ? ?Intake/Output Summary (Last 24 hours) at 08/01/2021 1043 ?Last data filed at 07/31/2021 1840 ?Gross per 24 hour  ?Intake 480 ml  ?Output --  ?Net 480 ml  ? ?Filed Weights  ? 07/30/21 0500 07/31/21 0500 08/01/21 0202  ?Weight: 93.2 kg 93.6 kg 93.8 kg  ? ?Body mass index is 35.5 kg/m?.  ? ?Physical Examination: ? ?General: Obese built, not in obvious distress ?HENT:   No scleral pallor or icterus noted. Oral mucosa is moist.  ?Chest:  Clear breath sounds.  Diminished breath sounds bilaterally. No crackles or wheezes.  ?CVS: S1 &S2 heard. No murmur.  Regular rate and rhythm. ?Abdomen: Soft, nontender, nondistended.  Bowel sounds are heard.   ?Extremities: No cyanosis, clubbing or edema.  Peripheral pulses are palpable.  Left upper extremity lymphedema, right knee on a knee brace. ?Psych: Alert, awake and oriented, normal mood ?CNS:  No cranial nerve deficits.  Moving upper extremities.  Right knee on a brace.   ?Skin: Warm and dry.  Right knee on a knee brace. ? ?Data Reviewed:  ? ?CBC: ?Recent Labs  ?Lab 07/26/21 ?0354 07/28/21 ?0359 07/30/21 ?0320  ?HGB 6.9* 7.0* 7.3*  ?HCT 23.1* 23.8* 25.5*  ? ? ?Basic Metabolic Panel: ?Recent Labs  ?Lab 07/26/21 ?0354 07/28/21 ?6886  ?CREATININE 1.11* 1.08*  ? ? ?Liver Function Tests: ?No results for input(s): AST, ALT, ALKPHOS, BILITOT, PROT, ALBUMIN in the last 168 hours. ? ? ?Radiology Studies: ?No results found. ? ? ? LOS: 22 days  ? ? ?Flora Lipps, MD ?[facility] ?08/01/2021, 10:43 AM  ?  ?

## 2021-08-01 NOTE — Progress Notes (Signed)
Physical Therapy Treatment ?Patient Details ?Name: Danielle [name] ?MRN: [id number] ?DOB: 1945-11-17 ?Today's Date: 08/01/2021 ? ? ?History of Present Illness Patient is a 76 y/o female admitted due to R TKA infection.  Initially had TKA 04/2018 then had pain/swelling 03/2021 with I&D and liner exchange and IV antibotics. Admitted with worse pain and difficulty ambulating with fevers. 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 Post op had severe anemia with transfer to ICU due to [facility] witness and refusing blood products. Did finally have transfusion 2/17 with hemoglobin at 5.0 after transfusion. PMH significant for HFpEF, CAD w/ multiple prior PCIs, SSS s/p PPM, Afib on AC, OSA on CPAP, CKD, HTN, DM. ? ?  ?PT Comments  ? ? Pt progressing slowly towards physical therapy goals. Agreeable to OOB to [facility] and then motivated to get to the recliner. Increased time required for all aspects of mobility. Pt reports significant pain and feel pain limited session. Pt was premedicated and RN provided additional IV pain meds during session 2? decreased tolerance for activity from pain. Discussed rehab options at discharge and pt reports she is going home with her daughter, not to any other rehab. Based on performance today, feel pt will benefit from max home health services to maximize functional potential, decrease risk for falls, and decrease burden of care on family. Will continue to follow.  ?   ?Recommendations for follow up therapy are one component of a multi-disciplinary discharge planning process, led by the attending physician.  Recommendations may be updated based on patient status, additional functional criteria and insurance authorization. ? ?Follow Up Recommendations ? Skilled nursing-short term rehab (<3 hours/day) (versus home with maximized home services, per pt pt's daughter now taking her home) ?  ?  ?Assistance Recommended at Discharge Frequent or constant  Supervision/Assistance  ?Patient can return home with the following Help with stairs or ramp for entrance;Assist for transportation;A little help with walking and/or transfers ?  ?Equipment Recommendations ? None recommended by PT  ?  ?Recommendations for Other Services   ? ? ?  ?Precautions / Restrictions Precautions ?Precautions: Fall;Knee ?Precaution Booklet Issued: No ?Required Braces or Orthoses: Knee Immobilizer - Right ?Knee Immobilizer - Right: On when out of bed or walking;Other (comment) (okay to remove during therapy) ?Restrictions ?Weight Bearing Restrictions: Yes ?RLE Weight Bearing: Partial weight bearing ?RLE Partial Weight Bearing Percentage or Pounds: 50%  ?  ? ?Mobility ? Bed Mobility ?Overal bed mobility: Modified Independent ?Bed Mobility: Supine to Sit ?  ?  ?  ?  ?  ?General bed mobility comments: HOB slightly elevated. Pt was able to transition to EOB without assistance. ?  ? ?Transfers ?Overall transfer level: Needs assistance ?Equipment used: Rolling walker (2 wheels) ?Transfers: Sit to/from Stand, Bed to chair/wheelchair/BSC ?Sit to Stand: Min assist ?  ?Step pivot transfers: Min guard ?  ?  ?  ?General transfer comment: Pt was able to power up to full stand with bed height elevated and assist for balance support. Increased time for pivotal steps around to the [facility]. ?  ? ?Ambulation/Gait ?Ambulation/Gait assistance: Min guard, Min assist ?Gait Distance (Feet): 5 Feet ?Assistive device: Rolling walker (2 wheels) ?Gait Pattern/deviations: Decreased step length - right, Decreased step length - left, Trunk flexed, Step-to pattern ?Gait velocity: Decreased ?Gait velocity interpretation: <1.31 ft/sec, indicative of household ambulator ?  ?General Gait Details: Slow and deliberate steps from [facility] to recliner (~5 feet). Pt with difficulty maintaining upright posture but overall able to advance  LE's fairly well. ? ? ?Stairs ?  ?  ?  ?  ?  ? ? ?Wheelchair Mobility ?  ? ?Modified Rankin (Stroke Patients  Only) ?  ? ? ?  ?Balance Overall balance assessment: Needs assistance ?  ?Sitting balance-Leahy Scale: Fair ?Sitting balance - Comments: able to static sit with BUE ?  ?Standing balance support: Bilateral upper extremity supported, Reliant on assistive device for balance ?Standing balance-Leahy Scale: Poor ?Standing balance comment: reliant on BUE support on RW ?  ?  ?  ?  ?  ?  ?  ?  ?  ?  ?  ?  ? ?  ?Cognition Arousal/Alertness: Awake/alert ?Behavior During Therapy: [facility] for tasks assessed/performed ?Overall Cognitive Status: Impaired/Different from baseline ?Area of Impairment: Attention, Following commands, Safety/judgement ?  ?  ?  ?  ?  ?  ?  ?  ?  ?Current Attention Level: Focused, Sustained ?  ?Following Commands: Follows one step commands with increased time, Follows one step commands inconsistently ?Safety/Judgement: Decreased awareness of safety, Decreased awareness of deficits ?  ?  ?General Comments: pt very slow to warm to PT staff, difficult to motivate ?  ?  ? ?  ?Exercises   ? ?  ?General Comments   ?  ?  ? ?Pertinent Vitals/Pain Pain Assessment ?Pain Assessment: Faces ?Faces Pain Scale: Hurts even more ?Pain Location: R knee ?Pain Descriptors / Indicators: Grimacing, Guarding, Moaning, Shooting ?Pain Intervention(s): Limited activity within patient's tolerance, Monitored during session, Repositioned, RN gave pain meds during session, Premedicated before session  ? ? ?Home Living Family/patient expects to be discharged to:: Skilled nursing facility ?Living Arrangements: Alone ?Available Help at Discharge: Available PRN/intermittently ?  ?  ?  ?  ?  ?  ?Home Equipment: BSC/3in1;Rollator (4 wheels);Cane - single point ?   ?  ?Prior Function    ?  ?  ?   ? ?PT Goals (current goals can now be found in the care plan section) Acute Rehab PT Goals ?Patient Stated Goal: to go to rehab ?PT Goal Formulation: With patient/family ?Time For Goal Achievement: 08/14/21 ?Potential to Achieve Goals: Fair ?Progress  towards PT goals: Progressing toward goals ? ?  ?Frequency ? ? ? Min 4X/week ? ? ? ?  ?PT Plan Current plan remains appropriate  ? ? ?Co-evaluation   ?  ?  ?  ?  ? ?  ?AM-PAC PT "6 Clicks" Mobility   ?Outcome Measure ? Help needed turning from your back to your side while in a flat bed without using bedrails?: None ?Help needed moving from lying on your back to sitting on the side of a flat bed without using bedrails?: None ?Help needed moving to and from a bed to a chair (including a wheelchair)?: A Little ?Help needed standing up from a chair using your arms (e.g., wheelchair or bedside chair)?: A Little ?Help needed to walk in hospital room?: Total ?Help needed climbing 3-5 steps with a railing? : Total ?6 Click Score: 16 ? ?  ?End of Session Equipment Utilized During Treatment: Right knee immobilizer ?Activity Tolerance: Patient tolerated treatment well ?Patient left: in chair;with call bell/phone within reach;with nursing/sitter in room ?Nurse Communication: Mobility status ?PT Visit Diagnosis: Difficulty in walking, not elsewhere classified (R26.2);Pain;Muscle weakness (generalized) (M62.81) ?Pain - Right/Left: Right ?Pain - part of body: Knee ?  ? ? ?Time: 3557-3220 ?PT Time Calculation (min) (ACUTE ONLY): 64 min ? ?Charges:  $Therapeutic Activity: 23-37 mins ?$Self Care/Home Management: 23-37          ?          ? ?  Rolinda Roan, PT, DPT ?Acute Rehabilitation Services ?Pager: [phone] ?Office: [phone]  ? ? ?Thelma Comp ?08/01/2021, 3:03 PM ? ?

## 2021-08-01 NOTE — Progress Notes (Signed)
Mobility Specialist Progress Note  ? ? 08/01/21 1712  ?Mobility  ?Activity Refused mobility  ? ?Pt c/o pain. Will f/u as schedule permits.  ? ?Hildred Alamin ?Mobility Specialist  ?M.S. 5N: [phone]  ?

## 2021-08-01 NOTE — Discharge Instructions (Signed)
Information on my medicine - ELIQUIS? (apixaban) ? ?This medication education was reviewed with me or my healthcare representative as part of my discharge preparation.  The pharmacist that spoke with me during my hospital stay was:  ?Arman Bogus, [facility] ? ?Why was Eliquis? prescribed for you? ?Eliquis? was prescribed to treat blood clots that may have been found in the veins of your legs (deep vein thrombosis) or in your lungs (pulmonary embolism) and to reduce the risk of them occurring again. ? ?What do You need to know about Eliquis? ? ?The  dose is ONE 5 mg tablet taken TWICE daily.  Eliquis? may be taken with or without food.  ? ?Try to take the dose about the same time in the morning and in the evening. If you have difficulty swallowing the tablet whole please discuss with your pharmacist how to take the medication safely. ? ?Take Eliquis? exactly as prescribed and DO NOT stop taking Eliquis? without talking to the doctor who prescribed the medication.  Stopping may increase your risk of developing a new blood clot.  Refill your prescription before you run out. ? ?After discharge, you should have regular check-up appointments with your healthcare provider that is prescribing your Eliquis?. ?   ?What do you do if you miss a dose? ?If a dose of ELIQUIS? is not taken at the scheduled time, take it as soon as possible on the same day and twice-daily administration should be resumed. The dose should not be doubled to make up for a missed dose. ? ?Important Safety Information ?A possible side effect of Eliquis? is bleeding. You should call your healthcare provider right away if you experience any of the following: ?Bleeding from an injury or your nose that does not stop. ?Unusual colored urine (red or dark brown) or unusual colored stools (red or black). ?Unusual bruising for unknown reasons. ?A serious fall or if you hit your head (even if there is no bleeding). ? ?Some medicines may interact with Eliquis?  and might increase your risk of bleeding or clotting while on Eliquis?Marland Kitchen To help avoid this, consult your healthcare provider or pharmacist prior to using any new prescription or non-prescription medications, including herbals, vitamins, non-steroidal anti-inflammatory drugs (NSAIDs) and supplements. ? ?This website has more information on Eliquis? (apixaban): http://www.eliquis.com/eliquis/home  ?

## 2021-08-02 ENCOUNTER — Telehealth (HOSPITAL_COMMUNITY): Payer: Self-pay

## 2021-08-02 DIAGNOSIS — E119 Type 2 diabetes mellitus without complications: Secondary | ICD-10-CM | POA: Diagnosis not present

## 2021-08-02 DIAGNOSIS — T8453XD Infection and inflammatory reaction due to internal right knee prosthesis, subsequent encounter: Secondary | ICD-10-CM | POA: Diagnosis not present

## 2021-08-02 DIAGNOSIS — I82622 Acute embolism and thrombosis of deep veins of left upper extremity: Secondary | ICD-10-CM | POA: Diagnosis not present

## 2021-08-02 DIAGNOSIS — I509 Heart failure, unspecified: Secondary | ICD-10-CM | POA: Diagnosis not present

## 2021-08-02 DIAGNOSIS — T8450XD Infection and inflammatory reaction due to unspecified internal joint prosthesis, subsequent encounter: Secondary | ICD-10-CM | POA: Diagnosis not present

## 2021-08-02 DIAGNOSIS — I959 Hypotension, unspecified: Secondary | ICD-10-CM | POA: Diagnosis not present

## 2021-08-02 DIAGNOSIS — C801 Malignant (primary) neoplasm, unspecified: Secondary | ICD-10-CM | POA: Diagnosis not present

## 2021-08-02 DIAGNOSIS — R300 Dysuria: Secondary | ICD-10-CM | POA: Diagnosis not present

## 2021-08-02 DIAGNOSIS — E1165 Type 2 diabetes mellitus with hyperglycemia: Secondary | ICD-10-CM | POA: Diagnosis not present

## 2021-08-02 DIAGNOSIS — E039 Hypothyroidism, unspecified: Secondary | ICD-10-CM | POA: Diagnosis not present

## 2021-08-02 DIAGNOSIS — M00061 Staphylococcal arthritis, right knee: Secondary | ICD-10-CM | POA: Diagnosis not present

## 2021-08-02 DIAGNOSIS — T8459XD Infection and inflammatory reaction due to other internal joint prosthesis, subsequent encounter: Secondary | ICD-10-CM | POA: Diagnosis not present

## 2021-08-02 DIAGNOSIS — E785 Hyperlipidemia, unspecified: Secondary | ICD-10-CM | POA: Diagnosis not present

## 2021-08-02 DIAGNOSIS — D62 Acute posthemorrhagic anemia: Secondary | ICD-10-CM | POA: Diagnosis not present

## 2021-08-02 DIAGNOSIS — N183 Chronic kidney disease, stage 3 unspecified: Secondary | ICD-10-CM | POA: Diagnosis not present

## 2021-08-02 DIAGNOSIS — Z743 Need for continuous supervision: Secondary | ICD-10-CM | POA: Diagnosis not present

## 2021-08-02 DIAGNOSIS — I480 Paroxysmal atrial fibrillation: Secondary | ICD-10-CM | POA: Diagnosis not present

## 2021-08-02 DIAGNOSIS — I5030 Unspecified diastolic (congestive) heart failure: Secondary | ICD-10-CM | POA: Diagnosis not present

## 2021-08-02 DIAGNOSIS — N1832 Chronic kidney disease, stage 3b: Secondary | ICD-10-CM | POA: Diagnosis not present

## 2021-08-02 DIAGNOSIS — E669 Obesity, unspecified: Secondary | ICD-10-CM | POA: Diagnosis not present

## 2021-08-02 DIAGNOSIS — T8453XA Infection and inflammatory reaction due to internal right knee prosthesis, initial encounter: Secondary | ICD-10-CM | POA: Diagnosis not present

## 2021-08-02 DIAGNOSIS — E113411 Type 2 diabetes mellitus with severe nonproliferative diabetic retinopathy with macular edema, right eye: Secondary | ICD-10-CM | POA: Diagnosis not present

## 2021-08-02 DIAGNOSIS — I129 Hypertensive chronic kidney disease with stage 1 through stage 4 chronic kidney disease, or unspecified chronic kidney disease: Secondary | ICD-10-CM | POA: Diagnosis not present

## 2021-08-02 DIAGNOSIS — G4733 Obstructive sleep apnea (adult) (pediatric): Secondary | ICD-10-CM | POA: Diagnosis not present

## 2021-08-02 DIAGNOSIS — I10 Essential (primary) hypertension: Secondary | ICD-10-CM | POA: Diagnosis not present

## 2021-08-02 DIAGNOSIS — I2510 Atherosclerotic heart disease of native coronary artery without angina pectoris: Secondary | ICD-10-CM | POA: Diagnosis not present

## 2021-08-02 DIAGNOSIS — I5032 Chronic diastolic (congestive) heart failure: Secondary | ICD-10-CM | POA: Diagnosis not present

## 2021-08-02 DIAGNOSIS — H6690 Otitis media, unspecified, unspecified ear: Secondary | ICD-10-CM | POA: Diagnosis not present

## 2021-08-02 DIAGNOSIS — Z96651 Presence of right artificial knee joint: Secondary | ICD-10-CM | POA: Diagnosis not present

## 2021-08-02 DIAGNOSIS — E113412 Type 2 diabetes mellitus with severe nonproliferative diabetic retinopathy with macular edema, left eye: Secondary | ICD-10-CM | POA: Diagnosis not present

## 2021-08-02 LAB — GLUCOSE, CAPILLARY
Glucose-Capillary: 68 mg/dL — ABNORMAL LOW (ref 70–99)
Glucose-Capillary: 108 mg/dL — ABNORMAL HIGH (ref 70–99)
Glucose-Capillary: 99 mg/dL (ref 70–99)

## 2021-08-02 MED ORDER — SENNA 8.6 MG PO TABS
1.0000 | ORAL_TABLET | Freq: Two times a day (BID) | ORAL | 0 refills | Status: DC
Start: 2021-08-02 — End: 2021-12-11

## 2021-08-02 MED ORDER — IRON 325 (65 FE) MG PO TABS
1.0000 | ORAL_TABLET | Freq: Two times a day (BID) | ORAL | Status: DC
Start: 2021-08-02 — End: 2022-01-31

## 2021-08-02 MED ORDER — THIAMINE HCL 100 MG PO TABS: 100.0000 mg | ORAL_TABLET | Freq: Every day | ORAL | 0 refills | Status: AC

## 2021-08-02 MED ORDER — CARVEDILOL 3.125 MG PO TABS: 3.1250 mg | ORAL_TABLET | Freq: Two times a day (BID) | ORAL | Status: DC

## 2021-08-02 MED ORDER — FOLIC ACID 1 MG PO TABS: 1.0000 mg | ORAL_TABLET | Freq: Every day | ORAL | 0 refills | Status: AC

## 2021-08-02 MED ORDER — TORSEMIDE 20 MG PO TABS
40.0000 mg | ORAL_TABLET | ORAL | Status: DC
Start: 2021-08-02 — End: 2021-09-20

## 2021-08-02 MED ORDER — CEFAZOLIN IV (FOR PTA / DISCHARGE USE ONLY): 2.0000 g | Freq: Three times a day (TID) | INTRAVENOUS | 0 refills | Status: AC

## 2021-08-02 MED ORDER — GABAPENTIN 300 MG PO CAPS: 300.0000 mg | ORAL_CAPSULE | Freq: Three times a day (TID) | ORAL | Status: DC

## 2021-08-02 MED ORDER — OXYCODONE HCL 10 MG PO TABS: 10.0000 mg | ORAL_TABLET | ORAL | 0 refills | Status: AC | PRN

## 2021-08-02 MED ORDER — CEFAZOLIN SODIUM-DEXTROSE 2-4 GM/100ML-% IV SOLN: 2.0000 g | Freq: Three times a day (TID) | INTRAVENOUS | Status: AC

## 2021-08-02 MED ORDER — GABAPENTIN 100 MG PO CAPS
100.0000 mg | ORAL_CAPSULE | Freq: Three times a day (TID) | ORAL | Status: DC
  Administered 2021-08-02: 100 mg via ORAL
  Filled 2021-08-02: qty 1

## 2021-08-02 MED ORDER — ONDANSETRON HCL 4 MG PO TABS: 4.0000 mg | ORAL_TABLET | Freq: Four times a day (QID) | ORAL | 0 refills | Status: DC | PRN

## 2021-08-02 MED ORDER — INSULIN GLARGINE-YFGN 100 UNIT/ML ~~LOC~~ SOLN
30.0000 [IU] | Freq: Every day | SUBCUTANEOUS | Status: DC
  Filled 2021-08-02: qty 0.3

## 2021-08-02 NOTE — Progress Notes (Signed)
OT Cancellation Note ? ?Patient Details ?Name: Danielle [name] ?MRN: [id number] ?DOB: [birth year] ? ? ?Cancelled Treatment:    Reason Eval/Treat Not Completed: Fatigue/lethargy limiting ability to participate.  Pt reports she is fatigued after PT and is unable to work with OT at this time.  Will reattempt. ? ?Caylan Chenard C., OTR/L ?Acute Rehabilitation Services ?Pager [phone] ?Office [phone] ? ? ?Lucille Passy M ?08/02/2021, 12:09 PM ?

## 2021-08-02 NOTE — Discharge Summary (Signed)
Physician Discharge Summary   Patient: Danielle [name] MRN: [id number] DOB: 1946/01/27  Admit date:     07/10/2021  Discharge date: 08/02/21  Discharge Physician: Flora Lipps   PCP: Seward Carol, MD   Recommendations at discharge:   Follow-up with primary care provider at the skilled nursing facility in 3 to 5 days.   Follow-up with Dr. Zachery Dakins orthopedics in 1 week for post operative follow-up of the knee.  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 *  Hospital Course: 76 year old female with past medical history of bilateral knee replacement, recent recurrent right knee septic arthritis, essential hypertension, coronary artery disease status post stent placement, combined chronic systolic and diastolic heart failure, CKD stage IIIa, history of left breast cancer, obstructive sleep apnea on CPAP, morbid obesity, gout, Jehovah's Witness presented to the hospital for failed antibiotic treatment of right knee recurrent septic arthritis.  Of note patient had initially undergone  right TKA with Dr. Mardelle Matte December 2019 with development of acute pain/swelling of the right knee in November 2022 in which she was admitted to the hospital and diagnosed with a staph infection in her prosthetic knee.  She underwent irrigation debridement with liner exchange on April 14, 2021 and  postoperatively treated with IV antibiotics with cefazolin with transition to Keflex thereafter.  On this presentation, patient was initially admitted to the intensive care unit.  Patient was subsequently transferred to hospital service on 07/14/2021 but was noted to have acute drop in hemoglobin and was transferred back to the ICU.  Patient however received blood products in the ICU for 1 time.  Patient was then stabilized and was transferred back to medical service.   Assessment and Plan: * Septic Arthritis, Infection of prosthetic right knee joint ([place]), MSSA infection Failed outpatient antibiotics for recurrent right TKA prosthetic infection with MSSA.  Patient underwent explant right total knee arthroplasty with implantation of articulating antibiotic spacer by Dr. Zachery Dakins on 07/12/2021.   Infectious disease was consulted.  Patient has been transitioned to cefazolin.  Has PICC line.  Last day of antibiotic will be 08/23/21.  OPAT orders placed by ID.  At this time plan is to discharge to skilled nursing facility for antibiotic.  Acute blood loss anemia Jehovah witness refusing blood transfusion .  Baseline hemoglobin between 8-9.  Patient received darbepoetin on 07/14/2021.  Continue iron supplementation on discharge. CT abdomen pelvis did not show any retroperitoneal hemorrhage.  Latest hemoglobin of 8.5  on 07/30/2021.  Blood draws were conservative during hospitalization to save blood.  DVT (deep venous thrombosis) (HCC) Left upper extremity DVT History of left breast cancer Left upper extremity lymphedema History of breast cancer with axillary lymph node resection.  Ultrasound on 07/21/2021 showed DVT of the left brachial vein.  Patient was on Eliquis prior to surgery for atrial fibrillation.  This admission patient had significant anemia.  Eliquis has been resumed after discussing with orthopedics and  cardiologist.  This will be continued on discharge.  Hemoglobin has been stable  CKD (chronic  kidney disease), stage III ([place]) Creatinine on 07/26/2021 was 1.1 and was stable.   Paroxysmal atrial fibrillation (HCC) Patient on carvedilol 12.5 mg p.o. twice daily and Eliquis at home.  Eliquis has been resumed at this time and dose of Coreg has been decreased..   Type 2 diabetes mellitus with diabetic polyneuropathy, with long-term current use of insulin (HCC) HbA1c 8.2.  Continue insulin regimen Jardiance on discharge.  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 2D echocardiogram with LV ejection fraction of 45 to 50% from echocardiogram 2021.  Patient is on torsemide 40 mg p.o. daily, spironolactone 12.5 mg p.o. daily, losartan 25 mg p.o. daily, carvedilol 12.5 mg p.o. twice daily at home.  Continue Coreg and torsemide, on discharge.  Could resume losartan and spironolactone as outpatient if blood pressure is stable including renal function as outpatient..  Currently blood pressure marginally low.  Obesity (BMI 30-39.9) Estimated body mass index is 37.27 kg/m as calculated from the following:   Height as of this encounter: 5' 4"  (1.626 m).   Weight as of this encounter: 98.5 kg. Patient would benefit from weight loss as outpatient.  OSA (obstructive sleep apnea) Continue CPAP at nighttime  Hypothyroidism Continue Synthroid.  Essential hypertension  On Coreg and torsemide.  Dose of Coreg has been decreased at this time.   Consultants: Orthopedics  Procedures performed:  PRBC transfusion. Explant right total knee arthroplasty with implantation of articulating antibiotic spacer by Dr. Zachery Dakins on 07/12/2021.    Disposition: Skilled nursing facility Diet recommendation:  Discharge Diet Orders (From admission, onward)     Start     Ordered   08/02/21 0000  Diet Carb Modified        08/02/21 1152           Carb modified diet DISCHARGE MEDICATION: Allergies as of 08/02/2021       Reactions   Other Other (See Comments)   Blood Product  Refusal (Jehovah's witness)   Tape Other (See Comments)   Leaves blisters and marks on the skin   Sulfa Antibiotics Rash   Uloric [febuxostat] Rash        Medication List     STOP taking these medications    HYDROcodone-acetaminophen 5-325 MG tablet Commonly known as: NORCO/VICODIN   losartan 25 MG tablet Commonly known as: COZAAR   potassium chloride 10 MEQ tablet Commonly known as: KLOR-CON M   spironolactone 25 MG tablet Commonly known as: ALDACTONE       TAKE these medications    acetaminophen 500 MG tablet Commonly known as: TYLENOL Take 500-1,000 mg by mouth every 6 (six) hours as needed (for pain or headaches).   ALIVE ONCE DAILY WOMENS 50+ PO Take 1 tablet by mouth daily.   allopurinol 100 MG tablet Commonly known as: ZYLOPRIM Take 100 mg by mouth daily.   atorvastatin 80 MG tablet Commonly known as: LIPITOR Take 1 tablet (80 mg total) by mouth at bedtime.   Basaglar KwikPen 100 UNIT/ML Inject 40 Units into the skin at bedtime.   Biotin 10 MG Tabs Take 10 mg by mouth daily.   carvedilol 3.125 MG tablet Commonly known as: COREG Take 1 tablet (3.125 mg total) by mouth 2 (two) times daily with a meal. What changed:  medication strength how much to take   ceFAZolin  IVPB Commonly known as: ANCEF Inject 2 g into the  vein every 8 (eight) hours. Indication:  R-knee MSSA PJI First Dose: Yes Last Day of Therapy:  08/23/21 Labs - Once weekly:  CBC/D and BMP, Labs - Every other week:  ESR and CRP Method of administration: IV Push Pull PICC line at the completion of therapy Method of administration may be changed at the discretion of home infusion pharmacist based upon assessment of the patient and/or caregiver's ability to self-administer the medication ordered.   ceFAZolin 2-4 GM/100ML-% IVPB Commonly known as: ANCEF Inject 100 mLs (2 g total) into the vein every 8 (eight) hours for 21 days.   clotrimazole-betamethasone lotion Commonly known as:  LOTRISONE Apply 1 application topically 2 (two) times daily as needed (itching).   Dexcom G6 Sensor Misc 1 Device by Does not apply route as directed.   Dexcom G6 Transmitter Misc 1 Device by Does not apply route as directed.   Eliquis 5 MG Tabs tablet Generic drug: apixaban Take 1 tablet (5 mg total) by mouth 2 (two) times daily.   empagliflozin 10 MG Tabs tablet Commonly known as: JARDIANCE Take 10 mg by mouth daily.   folic acid 1 MG tablet Commonly known as: FOLVITE Take 1 tablet (1 mg total) by mouth daily. Start taking on: August 03, 2021   gabapentin 300 MG capsule Commonly known as: NEURONTIN Take 1 capsule (300 mg total) by mouth 3 (three) times daily. What changed: when to take this   insulin lispro 100 UNIT/ML injection Commonly known as: HUMALOG Inject 0.05 mLs (5 Units total) into the skin 3 (three) times daily before meals. What changed:  how much to take additional instructions   Iron 325 (65 Fe) MG Tabs Take 1 tablet (325 mg total) by mouth 2 (two) times daily before a meal. What changed: when to take this   latanoprost 0.005 % ophthalmic solution Commonly known as: XALATAN Place 1 drop into both eyes at bedtime.   levothyroxine 75 MCG tablet Commonly known as: SYNTHROID Take 75 mcg by mouth daily before breakfast.   Lumigan 0.01 % Soln Generic drug: bimatoprost Place 1 drop into both eyes at bedtime.   ondansetron 4 MG tablet Commonly known as: ZOFRAN Take 1 tablet (4 mg total) by mouth every 6 (six) hours as needed for nausea.   OneTouch Verio w/Device Kit Use to check blood sugar   Oxycodone HCl 10 MG Tabs Take 1 tablet (10 mg total) by mouth every 4 (four) hours as needed for moderate pain or severe pain.   polyethylene glycol 17 g packet Commonly known as: MIRALAX / GLYCOLAX Take 17 g by mouth daily.   senna 8.6 MG Tabs tablet Commonly known as: SENOKOT Take 1 tablet (8.6 mg total) by mouth 2 (two) times daily. For constipation.    thiamine 100 MG tablet Take 1 tablet (100 mg total) by mouth daily. Start taking on: August 03, 2021   torsemide 20 MG tablet Commonly known as: DEMADEX Take 2 tablets (40 mg total) by mouth every other day. What changed: when to take this   Travoprost (BAK Free) 0.004 % Soln ophthalmic solution Commonly known as: TRAVATAN Place 1 drop into both eyes at bedtime.               Discharge Care Instructions  (From admission, onward)           Start     Ordered   08/02/21 0000  Change dressing on IV access line weekly and PRN  (Home infusion instructions - [facility] )        08/02/21 1140   08/02/21 0000  Discharge wound care:       Comments: Continue knee care.  Gauze and acid wrapping can be removed and left to air as per orthopedics.Marland Kitchen   08/02/21 1152            Contact information for follow-up providers     Laurice Record, MD Follow up on 07/25/2021.   Specialty: Infectious Diseases Why: at 10:30am Contact information: [address] [place] [address] [phone]         Willaim Sheng, MD. Schedule an appointment as soon as possible for a visit in 1 week(s).   Specialty: Orthopedic Surgery Why: for knee followup Contact information: [place] [address] [phone]              Contact information for after-discharge care     Destination     [facility] .   Service: Skilled Nursing Contact information: [address] [address] [phone]                    Subjective Today, patient was seen and examined at bedside.  Complains of pain in the knee.  No fever chills nausea vomiting.  Discharge Exam: Filed Weights   07/30/21 0500 07/31/21 0500 08/01/21 0202  Weight: 93.2 kg 93.6 kg 93.8 kg   Vitals with BMI 08/02/2021 08/01/2021 08/01/2021  Height - - -  Weight - - -  BMI - - -  Systolic 263 785 83  Diastolic  51 45 61  Pulse - 71 74    General: Obese built, not in obvious distress HENT:   No scleral pallor or icterus noted. Oral mucosa is moist.  Chest:  Clear breath sounds.  Diminished breath sounds bilaterally. No crackles or wheezes.  CVS: S1 &S2 heard. No murmur.  Regular rate and rhythm. Abdomen: Soft, nontender, nondistended.  Bowel sounds are heard.   Extremities: No cyanosis, clubbing or edema.  Peripheral pulses are palpable. Left upper extremity lymphedema, right knee on a knee brace. Psych: Alert, awake and oriented, normal mood CNS:  No cranial nerve deficits.  Power equal in all extremities.  Moves extremities. Skin: Warm and dry.  Right knee on a knee brace   Condition at discharge: good  The results of significant diagnostics from this hospitalization (including imaging, microbiology, ancillary and laboratory) are listed below for reference.   Imaging Studies: CT ABDOMEN PELVIS WO CONTRAST  Result Date: 07/14/2021 CLINICAL DATA:  Retroperitoneal bleed suspected acute blood loss anemia, rule out retroperitoneal bleed EXAM: CT ABDOMEN AND PELVIS WITHOUT CONTRAST TECHNIQUE: Multidetector CT imaging of the abdomen and pelvis was performed following the standard protocol without IV contrast. RADIATION DOSE REDUCTION: This exam was performed according to the departmental dose-optimization program which includes automated exposure control, adjustment of the mA and/or kV according to patient size and/or use of iterative reconstruction technique. COMPARISON:  CT abdomen pelvis 12/04/2019. FINDINGS: Lower chest: No acute abnormality. Bibasilar dependent mild atelectasis. Mitral annular calcifications. Partially visualized dual chamber pacemaker leads. Hepatobiliary: No focal liver abnormality is seen. Prior cholecystectomy. Pancreas: Unremarkable. No pancreatic ductal dilatation or surrounding inflammatory changes. Spleen: Normal in size without focal abnormality. Adrenals/Urinary Tract: Adrenal  glands are unremarkable. No hydronephrosis or nephrolithiasis. The bladder is decompressed with Foley catheter in place. Stomach/Bowel: There is a gastric diverticulum posteriorly. There is no evidence  of bowel obstruction.The appendix is normal. No acute inflammatory process involving the bowel. Vascular/Lymphatic: Aortoiliac atherosclerotic calcifications. No AAA. No lymphadenopathy. Reproductive: Unchanged left adnexal calcification. Other: Fat containing umbilical hernia. Mild diastasis recti. There is no evidence of retroperitoneal hematoma. No abdominopelvic ascites. No free air. Musculoskeletal: No acute osseous abnormality. No suspicious lytic or blastic lesions. Bilateral hip osteoarthritis. Moderate severe changes of the pubic symphysis. Multilevel degenerative changes of the spine, worst at L4-L5 with grade 1 anterolisthesis. No suspicious lytic or blastic lesions. IMPRESSION: No acute abdominopelvic abnormality. No evidence of retroperitoneal or intraperitoneal hematoma. Bibasilar atelectasis, corresponding to opacities seen on chest radiograph acquired earlier today. Electronically Signed   By: Maurine Simmering M.D.   On: 07/14/2021 17:36   DG Knee 1-2 Views Right  Result Date: 07/29/2021 CLINICAL DATA:  Concern for infection of knee replacement. EXAM: RIGHT KNEE - 1-2 VIEW COMPARISON:  Postoperative knee radiographs-07/12/2021; 04/14/2021 FINDINGS: Stable sequela redo total knee replacement including removal of the initial radiopaque tibial plateau prosthetic hardware (with presumed placement of a radiolucent antibiotic spacer) and exchange for a new radiopaque femoral component with persistent lucency about the anterior superior aspect of the new femoral component. No definitive evidence of hardware failure or loosening. No fracture or dislocation. There remains a small amount of scattered intra-articular postoperative air. Persistent adjacent soft tissue stranding. Extensive vascular calcifications. No  radiopaque foreign body. 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 Electronically Signed   By: Sandi Mariscal M.D.   On: 07/29/2021 10:46   DG Chest Port 1 View  Result Date: 07/15/2021 CLINICAL DATA:  Respiration abnormal EXAM: PORTABLE CHEST 1 VIEW COMPARISON:  Radiograph 07/14/2021 FINDINGS: Unchanged cardiomediastinal silhouette with dual chamber pacemaker leads. Mildly increased interstitial opacities. No large pleural effusion. No visible pneumothorax. Bibasilar atelectasis. No acute osseous abnormality. IMPRESSION: Mildly increased diffuse interstitial opacities, possibly developing interstitial edema. Electronically Signed   By: Maurine Simmering M.D.   On: 07/15/2021 09:51   DG CHEST PORT 1 VIEW  Result Date: 07/14/2021 CLINICAL DATA:  Pneumonia EXAM: PORTABLE CHEST 1 VIEW COMPARISON:  Radiograph 07/10/2020 FINDINGS: Unchanged dual chamber pacemaker leads. Unchanged cardiomediastinal silhouette. Bibasilar opacities favored to be atelectasis. No new airspace disease. No large pleural effusion. No visible pneumothorax. No acute osseous abnormality. IMPRESSION: Bibasilar opacities favored to be atelectasis, developing infection possible. Recommend radiographic follow-up. Electronically Signed   By: Maurine Simmering M.D.   On: 07/14/2021 12:01   DG Chest Port 1 View  Result Date: 07/10/2021 CLINICAL DATA:  CHF EXAM: PORTABLE CHEST 1 VIEW COMPARISON:  04/13/2021 FINDINGS: Left pacer remains in place, unchanged. Heart is borderline in size. Aortic atherosclerosis. No confluent opacities, effusions or edema. No acute bony abnormality. IMPRESSION: No active disease. Electronically Signed   By: Rolm Baptise M.D.   On: 07/10/2021 19:13   DG Knee Right Port  Result Date: 07/12/2021 CLINICAL DATA:  Postoperative knee replacement. EXAM: PORTABLE RIGHT KNEE - 1-2 VIEW COMPARISON:  04/14/2021 FINDINGS: There is interval removal of the prior tibial  plateau hardware and prior femoral condyle hardware. There is new cement related to new stem tunnels within the distal femur and proximal tibia. No metallic tibial prosthesis material is currently visualized. There is scattered bone density with chronic well corticated edges overlying the peripheral medial and lateral compartments. Severe patellofemoral joint space narrowing. Likely wound VAC drain overlies the proximal anterior knee. Mild soft tissue swelling and subcutaneous air consistent with recent surgery. Dense vascular calcifications.  IMPRESSION: Interval recent revision of right knee arthroplasty hardware. No evidence of hardware failure. Electronically Signed   By: Yvonne Kendall M.D.   On: 07/12/2021 22:25   ECHOCARDIOGRAM COMPLETE  Result Date: 07/18/2021    ECHOCARDIOGRAM REPORT   Patient Name:   CYNAI SKEENS Date of Exam: 07/18/2021 Medical Rec #:  [id number]     Height:       64.0 in Accession #:    [id number]    Weight:       217.2 lb Date of Birth:  16-Jun-1945     BSA:          2.026 m Patient Age:    80 years      BP:           101/42 mmHg Patient Gender: F             HR:           80 bpm. Exam Location:  Inpatient Procedure: 2D Echo, Cardiac Doppler and Color Doppler Indications:    I38 ENDOCARDITIS  History:        Patient has prior history of Echocardiogram examinations, most                 recent 04/29/2020. CHF, Signs/Symptoms:Dyspnea; Risk                 Factors:Hypertension and Diabetes. CAD / CHEMO.  Sonographer:    Beryle Beams Referring Phys: [phone] LINDSAY NICOLE [place]  1. Left ventricular ejection fraction, by estimation, is 40 to 45%. The left ventricle has mildly decreased function. The left ventricle demonstrates regional wall motion abnormalities (see scoring diagram/findings for description). Left ventricular diastolic parameters are consistent with Grade II diastolic dysfunction (pseudonormalization). Elevated left atrial pressure.  2. Right ventricular systolic  function is normal. The right ventricular size is normal.  3. Left atrial size was severely dilated.  4. The mitral valve is grossly normal. Mild to moderate mitral valve regurgitation. The mean mitral valve gradient is 5.5 mmHg with average heart rate of 82 bpm. Moderate mitral annular calcification.  5. Tricuspid valve regurgitation is moderate.  6. The aortic valve is tricuspid. There is mild calcification of the aortic valve. There is mild thickening of the aortic valve. Aortic valve regurgitation is mild. Aortic valve sclerosis/calcification is present, without any evidence of aortic stenosis.  7. The inferior vena cava is dilated in size with <50% respiratory variability, suggesting right atrial pressure of 15 mmHg. Comparison(s): Prior images reviewed side by side. The left ventricular function is worsened. The left ventricular wall motion abnormality is worse. The distribution of the wall motion abnormality is the same, but the reduction in contractility is worse. FINDINGS  Left Ventricle: Left ventricular ejection fraction, by estimation, is 40 to 45%. The left ventricle has mildly decreased function. The left ventricle demonstrates regional wall motion abnormalities. The left ventricular internal cavity size was normal in size. There is borderline concentric left ventricular hypertrophy. Left ventricular diastolic parameters are consistent with Grade II diastolic dysfunction (pseudonormalization). Elevated left atrial pressure.  LV Wall Scoring: The apical septal segment, apical inferior segment, and apex are akinetic. The apical anterior segment is hypokinetic. Right Ventricle: The right ventricular size is normal. Right vetricular wall thickness was not well visualized. Right ventricular systolic function is normal. Left Atrium: Left atrial size was severely dilated. Right Atrium: Right atrial size was normal in size. Pericardium: There is no evidence of pericardial effusion. Mitral Valve: The mitral  valve  is grossly normal. There is mild thickening of the mitral valve leaflet(s). Moderate mitral annular calcification. Mild to moderate mitral valve regurgitation, with centrally-directed jet. MV peak gradient, 69.2 mmHg. The mean mitral valve gradient is 5.5 mmHg with average heart rate of 82 bpm. Tricuspid Valve: The tricuspid valve is normal in structure. Tricuspid valve regurgitation is moderate. Aortic Valve: The aortic valve is tricuspid. There is mild calcification of the aortic valve. There is mild thickening of the aortic valve. Aortic valve regurgitation is mild. Aortic regurgitation PHT measures 460 msec. Aortic valve sclerosis/calcification is present, without any evidence of aortic stenosis. Aortic valve mean gradient measures 8.0 mmHg. Aortic valve peak gradient measures 12.1 mmHg. Aortic valve area, by VTI measures 1.32 cm. Pulmonic Valve: The pulmonic valve was grossly normal. Pulmonic valve regurgitation is not visualized. Aorta: The aortic root and ascending aorta are structurally normal, with no evidence of dilitation. Venous: The inferior vena cava is dilated in size with less than 50% respiratory variability, suggesting right atrial pressure of 15 mmHg. IAS/Shunts: No atrial level shunt detected by color flow Doppler.  LEFT VENTRICLE PLAX 2D LVIDd:         5.10 cm     Diastology LVIDs:         2.80 cm     LV e' medial:    4.05 cm/s LV PW:         1.30 cm     LV E/e' medial:  37.0 LV IVS:        1.10 cm     LV e' lateral:   5.03 cm/s LVOT diam:     1.90 cm     LV E/e' lateral: 29.8 LV SV:         49 LV SV Index:   24 LVOT Area:     2.84 cm  LV Volumes (MOD) LV vol d, MOD A2C: 78.4 ml LV vol d, MOD A4C: 89.4 ml LV vol s, MOD A2C: 26.9 ml LV vol s, MOD A4C: 38.5 ml LV SV MOD A2C:     51.5 ml LV SV MOD A4C:     89.4 ml LV SV MOD BP:      52.0 ml RIGHT VENTRICLE RV S prime:     12.90 cm/s TAPSE (M-mode): 2.0 cm LEFT ATRIUM             Index LA diam:        5.30 cm 2.62 cm/m LA Vol (A2C):   89.2  ml 44.03 ml/m LA Vol (A4C):   88.9 ml 43.88 ml/m LA Biplane Vol: 92.3 ml 45.56 ml/m  AORTIC VALVE                     PULMONIC VALVE AV Area (Vmax):    1.34 cm      PV Vmax:       0.60 m/s AV Area (Vmean):   1.21 cm      PV Vmean:      41.600 cm/s AV Area (VTI):     1.32 cm      PV VTI:        0.143 m AV Vmax:           174.00 cm/s   PV Peak grad:  1.4 mmHg AV Vmean:          132.000 cm/s  PV Mean grad:  1.0 mmHg AV VTI:            0.371 m AV Peak  Grad:      12.1 mmHg AV Mean Grad:      8.0 mmHg LVOT Vmax:         82.50 cm/s LVOT Vmean:        56.300 cm/s LVOT VTI:          0.173 m LVOT/AV VTI ratio: 0.47 AI PHT:            460 msec  AORTA Ao Root diam: 2.60 cm Ao Asc diam:  2.60 cm MITRAL VALVE                TRICUSPID VALVE MV Area (PHT): 3.27 cm     TV Peak grad:   22.7 mmHg MV Area VTI:   0.34 cm     TV Mean grad:   16.0 mmHg MV Peak grad:  69.2 mmHg    TV Vmax:        2.38 m/s MV Mean grad:  5.5 mmHg     TV Vmean:       192.0 cm/s MV Vmax:       4.16 m/s     TV VTI:         0.72 msec MV Vmean:      361.0 cm/s MV Decel Time: 232 msec     SHUNTS MV E velocity: 150.00 cm/s  Systemic VTI:  0.17 m MV A velocity: 120.00 cm/s  Systemic Diam: 1.90 cm MV E/A ratio:  1.25 Mihai Croitoru MD Electronically signed by Sanda Klein MD Signature Date/Time: 07/18/2021/3:35:46 PM    Final    VAS [place] LOWER EXTREMITY VENOUS (DVT)  Result Date: 07/11/2021  Lower Venous DVT Study Patient Name:  ENOLA SIEBERS  Date of Exam:   07/11/2021 Medical Rec #: [id number]      Accession #:    [id number] Date of Birth: 1946/04/20      Patient Gender: F Patient Age:   76 years Exam Location:  [facility] Procedure:      VAS [place] LOWER EXTREMITY VENOUS (DVT) Referring Phys: Wynetta Fines --------------------------------------------------------------------------------  Indications: Swelling.  Comparison Study: 04/13/21 prior Performing Technologist: Archie Patten RVS  Examination Guidelines: A complete evaluation includes B-mode  imaging, spectral Doppler, color Doppler, and power Doppler as needed of all accessible portions of each vessel. Bilateral testing is considered an integral part of a complete examination. Limited examinations for reoccurring indications may be performed as noted. The reflux portion of the exam is performed with the patient in reverse Trendelenburg.  +---------+---------------+---------+-----------+----------+---------------+  RIGHT     Compressibility Phasicity Spontaneity Properties Thrombus Aging   +---------+---------------+---------+-----------+----------+---------------+  CFV       Full            Yes       Yes                                     +---------+---------------+---------+-----------+----------+---------------+  SFJ       Full                                                              +---------+---------------+---------+-----------+----------+---------------+  FV Prox   Full                                                              +---------+---------------+---------+-----------+----------+---------------+  FV Mid    Full                                                              +---------+---------------+---------+-----------+----------+---------------+  FV Distal Full                                                              +---------+---------------+---------+-----------+----------+---------------+  PFV       Full                                                              +---------+---------------+---------+-----------+----------+---------------+  POP       Full            Yes       Yes                                     +---------+---------------+---------+-----------+----------+---------------+  PTV       Full                                                              +---------+---------------+---------+-----------+----------+---------------+  PERO                                                       patent by color   +---------+---------------+---------+-----------+----------+---------------+   +----+---------------+---------+-----------+----------+--------------+  LEFT Compressibility Phasicity Spontaneity Properties Thrombus Aging  +----+---------------+---------+-----------+----------+--------------+  CFV  Full            Yes       Yes                                    +----+---------------+---------+-----------+----------+--------------+     Summary: RIGHT: - There is no evidence of deep vein thrombosis in the lower extremity.  - No cystic structure found in the popliteal fossa.  LEFT: - No evidence of common femoral vein obstruction.  *See table(s) above for measurements and observations. Electronically signed by Deitra Mayo MD on 07/11/2021 at 4:11:38 PM.    Final    VAS [place] UPPER EXTREMITY VENOUS DUPLEX  Result Date: 07/21/2021 UPPER VENOUS STUDY  Patient Name:  IMA HAFNER  Date of Exam:   07/21/2021 Medical Rec #: [id number]      Accession #:    [id number] Date of Birth: 1945-07-01      Patient Gender: F Patient Age:   38 years Exam  [facility]:  [facility] Procedure:      VAS [place] UPPER EXTREMITY VENOUS DUPLEX Referring Phys: Marlowe Aschoff DAHAL --------------------------------------------------------------------------------  Indications: Edema Limitations: Poor ultrasound/tissue interface. Comparison Study: No prior study Performing Technologist: Maudry Mayhew MHA, RDMS, RVT, RDCS  Examination Guidelines: A complete evaluation includes B-mode imaging, spectral Doppler, color Doppler, and power Doppler as needed of all accessible portions of each vessel. Bilateral testing is considered an integral part of a complete examination. Limited examinations for reoccurring indications may be performed as noted.  Right Findings: +----------+------------+---------+-----------+------------------------+-------+  RIGHT      Compressible Phasicity Spontaneous        Properties        Summary   +----------+------------+---------+-----------+------------------------+-------+  Subclavian                 Yes        Yes     Patent surrounding PICC                                                                    line                    +----------+------------+---------+-----------+------------------------+-------+  Left Findings: +----------+------------+---------+-----------+----------+-------+  LEFT       Compressible Phasicity Spontaneous Properties Summary  +----------+------------+---------+-----------+----------+-------+  IJV            Full        Yes        Yes                         +----------+------------+---------+-----------+----------+-------+  Subclavian     Full        Yes        Yes                         +----------+------------+---------+-----------+----------+-------+  Axillary       Full        Yes        Yes                         +----------+------------+---------+-----------+----------+-------+  Brachial       None                   No                  Acute   +----------+------------+---------+-----------+----------+-------+  Radial         Full                                               +----------+------------+---------+-----------+----------+-------+  Ulnar          Full                                               +----------+------------+---------+-----------+----------+-------+  Cephalic       Full                                               +----------+------------+---------+-----------+----------+-------+  Basilic        None                                       Acute   +----------+------------+---------+-----------+----------+-------+  Summary:  Right: No evidence of thrombosis in the subclavian.  Left: Findings consistent with acute deep vein thrombosis involving a single left brachial vein. Findings consistent with acute superficial vein thrombosis involving the left basilic vein.  *See table(s) above for measurements and observations.  Diagnosing physician:  Monica Martinez MD Electronically signed by Monica Martinez MD on 07/21/2021 at 5:23:08 PM.    Final    [place] EKG SITE RITE  Result Date: 07/13/2021 If Site Rite image not attached, placement could not be confirmed due to current cardiac rhythm.   Microbiology: Results for orders placed or performed during the hospital encounter of 07/10/21  Surgical pcr screen     Status: None   Collection Time: 07/11/21  3:28 PM   Specimen: Nasopharyngeal Swab; Nasal Swab  Result Value Ref Range Status   MRSA, PCR NEGATIVE NEGATIVE Final   Staphylococcus aureus NEGATIVE NEGATIVE Final    Comment: (NOTE) The Xpert SA Assay (FDA approved for NASAL specimens in patients 68 years of age and older), is one component of a comprehensive surveillance program. It is not intended to diagnose infection nor to guide or monitor treatment. Performed at [facility], [place] [address], [place], [place] [address]   SARS CORONAVIRUS 2 (TAT 6-24 HRS) Nasopharyngeal Nasopharyngeal Swab     Status: None   Collection Time: 07/11/21  3:42 PM   Specimen: Nasopharyngeal Swab  Result Value Ref Range Status   SARS Coronavirus 2 NEGATIVE NEGATIVE Final    Comment: (NOTE) SARS-CoV-2 target nucleic acids are NOT DETECTED.  The SARS-CoV-2 RNA is generally detectable in upper and lower respiratory specimens during the acute phase of infection. Negative results do not preclude SARS-CoV-2 infection, do not rule out co-infections with other pathogens, and should not be used as the sole basis for treatment or other patient management decisions. Negative results must be combined with clinical observations, patient history, and epidemiological information. The expected result is Negative.  Fact Sheet for Patients: SugarRoll.be  Fact Sheet for Healthcare Providers: https://www.woods-mathews.com/  This test is not yet approved or cleared by the [place] FDA and  has been  authorized for detection and/or diagnosis of SARS-CoV-2 by FDA under an Emergency Use Authorization (EUA). This EUA will remain  in effect (meaning this test can be used) for the duration of the COVID-19 declaration under Se ction 564(b)(1) of the Act, 21 U.S.C. section 360bbb-3(b)(1), unless the authorization is terminated or revoked sooner.  Performed at [facility], [place] [address], [place], [place] [address]   Aerobic/Anaerobic Culture w Gram Stain (surgical/deep wound)     Status: None   Collection Time: 07/12/21  5:39 PM   Specimen: Soft Tissue, Other  Result Value Ref Range Status   Specimen Description TISSUE  Final   Special Requests RIGHT KNEE  Final   Gram Stain   Final    ABUNDANT WBC PRESENT,BOTH PMN AND MONONUCLEAR NO ORGANISMS SEEN    Culture   Final    RARE STAPHYLOCOCCUS AUREUS NOTIFIED DR. Juleen China VIA EPIC CHAT OF CULTURE GROWTH, AT 1048 07/14/21 D. VANHOOK NO ANAEROBES ISOLATED Performed at [facility], [place] [address], [place], [place] [address]  Report Status 07/17/2021 FINAL  Final   Organism ID, Bacteria STAPHYLOCOCCUS AUREUS  Final      Susceptibility   Staphylococcus aureus - MIC*    CIPROFLOXACIN <=0.5 SENSITIVE Sensitive     ERYTHROMYCIN <=0.25 SENSITIVE Sensitive     GENTAMICIN <=0.5 SENSITIVE Sensitive     OXACILLIN 0.5 SENSITIVE Sensitive     TETRACYCLINE <=1 SENSITIVE Sensitive     VANCOMYCIN 1 SENSITIVE Sensitive     TRIMETH/SULFA <=10 SENSITIVE Sensitive     CLINDAMYCIN <=0.25 SENSITIVE Sensitive     RIFAMPIN <=0.5 SENSITIVE Sensitive     Inducible Clindamycin NEGATIVE Sensitive     * RARE STAPHYLOCOCCUS AUREUS  Aerobic/Anaerobic Culture w Gram Stain (surgical/deep wound)     Status: None   Collection Time: 07/12/21  5:40 PM   Specimen: Soft Tissue, Other  Result Value Ref Range Status   Specimen Description TISSUE  Final   Special Requests RIGHT KNEE  Final   Gram Stain   Final    ABUNDANT WBC PRESENT,BOTH PMN AND  MONONUCLEAR NO ORGANISMS SEEN    Culture   Final    RARE STAPHYLOCOCCUS AUREUS NO ANAEROBES ISOLATED Performed at [facility], [address] [place], [place] [address]    Report Status 07/17/2021 FINAL  Final   Organism ID, Bacteria STAPHYLOCOCCUS AUREUS  Final      Susceptibility   Staphylococcus aureus - MIC*    CIPROFLOXACIN <=0.5 SENSITIVE Sensitive     ERYTHROMYCIN <=0.25 SENSITIVE Sensitive     GENTAMICIN <=0.5 SENSITIVE Sensitive     OXACILLIN <=0.25 SENSITIVE Sensitive     TETRACYCLINE <=1 SENSITIVE Sensitive     VANCOMYCIN 1 SENSITIVE Sensitive     TRIMETH/SULFA <=10 SENSITIVE Sensitive     CLINDAMYCIN <=0.25 SENSITIVE Sensitive     RIFAMPIN <=0.5 SENSITIVE Sensitive     Inducible Clindamycin NEGATIVE Sensitive     * RARE STAPHYLOCOCCUS AUREUS  Aerobic/Anaerobic Culture w Gram Stain (surgical/deep wound)     Status: None   Collection Time: 07/12/21  5:40 PM   Specimen: Soft Tissue, Other  Result Value Ref Range Status   Specimen Description TISSUE  Final   Special Requests RIGHT KNEE  Final   Gram Stain   Final    FEW WBC PRESENT,BOTH PMN AND MONONUCLEAR NO ORGANISMS SEEN    Culture   Final    RARE STAPHYLOCOCCUS AUREUS NO ANAEROBES ISOLATED Performed at [facility], [place] [address] [place], [place] [address]    Report Status 07/17/2021 FINAL  Final   Organism ID, Bacteria STAPHYLOCOCCUS AUREUS  Final      Susceptibility   Staphylococcus aureus - MIC*    CIPROFLOXACIN <=0.5 SENSITIVE Sensitive     ERYTHROMYCIN <=0.25 SENSITIVE Sensitive     GENTAMICIN <=0.5 SENSITIVE Sensitive     OXACILLIN 0.5 SENSITIVE Sensitive     TETRACYCLINE <=1 SENSITIVE Sensitive     VANCOMYCIN 1 SENSITIVE Sensitive     TRIMETH/SULFA <=10 SENSITIVE Sensitive     CLINDAMYCIN <=0.25 SENSITIVE Sensitive     RIFAMPIN <=0.5 SENSITIVE Sensitive     Inducible Clindamycin NEGATIVE Sensitive     * RARE STAPHYLOCOCCUS AUREUS  MRSA Next Gen by PCR, Nasal     Status: None    Collection Time: 07/13/21  4:16 AM   Specimen: Nasal Mucosa; Nasal Swab  Result Value Ref Range Status   MRSA by PCR Next Gen NOT DETECTED NOT DETECTED Final    Comment: (NOTE) The GeneXpert MRSA Assay (FDA  approved for NASAL specimens only), is one component of a comprehensive MRSA colonization surveillance program. It is not intended to diagnose MRSA infection nor to guide or monitor treatment for MRSA infections. Test performance is not FDA approved in patients less than 75 years old. Performed at [facility], [place] [address] [place], [place] [address]   Culture, blood (Routine X 2) w Reflex to ID Panel     Status: None   Collection Time: 07/14/21  2:50 PM   Specimen: BLOOD RIGHT HAND  Result Value Ref Range Status   Specimen Description BLOOD RIGHT HAND  Final   Special Requests   Final    BOTTLES DRAWN AEROBIC AND ANAEROBIC Blood Culture adequate volume   Culture   Final    NO GROWTH 5 DAYS Performed at [facility], [place] [address] [place], [place] [address]    Report Status 07/19/2021 FINAL  Final  Culture, blood (Routine X 2) w Reflex to ID Panel     Status: None   Collection Time: 07/14/21  3:05 PM   Specimen: BLOOD LEFT HAND  Result Value Ref Range Status   Specimen Description BLOOD LEFT HAND  Final   Special Requests   Final    BOTTLES DRAWN AEROBIC ONLY Blood Culture results may not be optimal due to an inadequate volume of blood received in culture bottles   Culture   Final    NO GROWTH 5 DAYS Performed at [facility], [place] [address] [place], [place] [address]    Report Status 07/19/2021 FINAL  Final    Labs: CBC: Recent Labs  Lab 07/28/21 0359 07/30/21 0320 08/01/21 1152  HGB 7.0* 7.3* 8.5*  HCT 23.8* 25.5* 63.0*   Basic Metabolic Panel: Recent Labs  Lab 07/28/21 0359  CREATININE 1.08*   Liver Function Tests: No results for input(s): AST, ALT, ALKPHOS, BILITOT, PROT, ALBUMIN in the last 168 hours. CBG: Recent Labs  Lab  08/01/21 1642 08/01/21 2042 08/02/21 0721 08/02/21 0904 08/02/21 1149  GLUCAP 131* 93 68* 108* 99    Discharge time spent: greater than 30 minutes.  Signed: Flora Lipps, MD [facility] 08/02/2021

## 2021-08-02 NOTE — Care Management Important Message (Signed)
Important Message ? ?Patient Details  ?Name: Danielle [name] ?MRN: [id number] ?Date of Birth: 1945-08-22 ? ? ?Medicare Important Message Given:  Yes ? ? ? ? ?Levada Dy  Anelle Parlow-Martin ?08/02/2021, 12:55 PM ?

## 2021-08-02 NOTE — Progress Notes (Signed)
Physical Therapy Treatment ?Patient Details ?Name: Danielle [name] ?MRN: [id number] ?DOB: January 14, 1946 ?Today's Date: 08/02/2021 ? ? ?History of Present Illness Patient is a 76 y/o female admitted due to R TKA infection.  Initially had TKA 04/2018 then had pain/swelling 03/2021 with I&D and liner exchange and IV antibotics. Admitted with worse pain and difficulty ambulating with fevers. 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 Post op had severe anemia with transfer to ICU due to [facility] witness and refusing blood products. Did finally have transfusion 2/17 with hemoglobin at 5.0 after transfusion. PMH significant for HFpEF, CAD w/ multiple prior PCIs, SSS s/p PPM, Afib on AC, OSA on CPAP, CKD, HTN, DM. ? ?  ?PT Comments  ? ? Pt premedicated and motivated to work with PT this morning. Pt was able to ambulate out to the hallway for the first time this admission. Feel she is progressing towards physical therapy goals, however am still unsure on details of home environment if she returns home at d/c. Pt reports she is returning to her 1 level apartment, however has previously discussed returning to her daughter's home with other therapists. Pt states if she returns to her apartment she will not have to negotiate any stairs. Regardless of location, would recommend frequent supervision for safety at d/c. Will continue to follow and progress as able per POC.  ?  ?Recommendations for follow up therapy are one component of a multi-disciplinary discharge planning process, led by the attending physician.  Recommendations may be updated based on patient status, additional functional criteria and insurance authorization. ? ?Follow Up Recommendations ? Skilled nursing-short term rehab (<3 hours/day) (versus home with maximized home services, per pt, she is returning home at d/c) ?  ?  ?Assistance Recommended at Discharge Frequent or constant Supervision/Assistance  ?Patient can return home  with the following Help with stairs or ramp for entrance;Assist for transportation;A little help with walking and/or transfers ?  ?Equipment Recommendations ? None recommended by PT  ?  ?Recommendations for Other Services   ? ? ?  ?Precautions / Restrictions Precautions ?Precautions: Fall;Knee ?Precaution Booklet Issued: No ?Required Braces or Orthoses: Knee Immobilizer - Right ?Knee Immobilizer - Right: On when out of bed or walking;Other (comment) (okay to remove during therapy) ?Restrictions ?Weight Bearing Restrictions: Yes ?RLE Weight Bearing: Partial weight bearing ?RLE Partial Weight Bearing Percentage or Pounds: 50%  ?  ? ?Mobility ? Bed Mobility ?Overal bed mobility: Needs Assistance ?Bed Mobility: Supine to Sit, Sit to Supine ?  ?  ?Supine to sit: Min assist ?Sit to supine: Mod assist ?  ?General bed mobility comments: Exited bed towards surgical side this session. Assist required for RLE movement towards EOB as well as to elevate back up into bed at end of session. ?  ? ?Transfers ?Overall transfer level: Needs assistance ?Equipment used: Rolling walker (2 wheels) ?Transfers: Sit to/from Stand ?Sit to Stand: Min guard, From elevated surface ?  ?  ?  ?  ?  ?General transfer comment: Pt was able to power up to full stand with bed height elevated and hands on guarding for balance support. ?  ? ?Ambulation/Gait ?Ambulation/Gait assistance: Min guard ?Gait Distance (Feet): 35 Feet ?Assistive device: Rolling walker (2 wheels) ?Gait Pattern/deviations: Decreased step length - right, Decreased step length - left, Trunk flexed, Step-to pattern ?Gait velocity: Decreased ?Gait velocity interpretation: <1.31 ft/sec, indicative of household ambulator ?  ?General Gait Details: Slow and deliberate steps out to hallway and back to the  bed. ~35 feet total. Pt with grossly flexed trunk throughout and difficulty maintaining corrective changes with cues. ? ? ?Stairs ?  ?  ?  ?  ?  ? ? ?Wheelchair Mobility ?  ? ?Modified  Rankin (Stroke Patients Only) ?  ? ? ?  ?Balance Overall balance assessment: Needs assistance ?  ?Sitting balance-Leahy Scale: Fair ?Sitting balance - Comments: able to static sit with BUE ?  ?Standing balance support: Bilateral upper extremity supported, Reliant on assistive device for balance ?Standing balance-Leahy Scale: Poor ?Standing balance comment: reliant on BUE support on RW ?  ?  ?  ?  ?  ?  ?  ?  ?  ?  ?  ?  ? ?  ?Cognition Arousal/Alertness: Awake/alert ?Behavior During Therapy: [facility] for tasks assessed/performed ?Overall Cognitive Status: Within Functional Limits for tasks assessed ?  ?  ?  ?  ?  ?  ?  ?  ?  ?  ?  ?  ?  ?  ?  ?  ?  ?  ?  ? ?  ?Exercises General Exercises - Lower Extremity ?Heel Slides: AAROM, Right, 10 reps, Supine ?Hip ABduction/ADduction: AROM, Right, 10 reps, Supine ? ?  ?General Comments   ?  ?  ? ?Pertinent Vitals/Pain Pain Assessment ?Pain Assessment: Faces ?Faces Pain Scale: Hurts little more ?Pain Location: R knee ?Pain Descriptors / Indicators: Grimacing, Operative site guarding ?Pain Intervention(s): Limited activity within patient's tolerance, Monitored during session, Repositioned, Premedicated before session  ? ? ?Home Living   ?  ?  ?  ?  ?  ?  ?  ?  ?  ?   ?  ?Prior Function    ?  ?  ?   ? ?PT Goals (current goals can now be found in the care plan section) Acute Rehab PT Goals ?Patient Stated Goal: Be able to walk more ?PT Goal Formulation: With patient ?Time For Goal Achievement: 08/14/21 ?Potential to Achieve Goals: Fair ?Progress towards PT goals: Progressing toward goals ? ?  ?Frequency ? ? ? Min 4X/week ? ? ? ?  ?PT Plan Current plan remains appropriate  ? ? ?Co-evaluation   ?  ?  ?  ?  ? ?  ?AM-PAC PT "6 Clicks" Mobility   ?Outcome Measure ? Help needed turning from your back to your side while in a flat bed without using bedrails?: None ?Help needed moving from lying on your back to sitting on the side of a flat bed without using bedrails?: None ?Help needed moving  to and from a bed to a chair (including a wheelchair)?: A Little ?Help needed standing up from a chair using your arms (e.g., wheelchair or bedside chair)?: A Little ?Help needed to walk in hospital room?: A Little ?Help needed climbing 3-5 steps with a railing? : A Little ?6 Click Score: 20 ? ?  ?End of Session Equipment Utilized During Treatment: Right knee immobilizer;Gait belt ?Activity Tolerance: Patient tolerated treatment well ?Patient left: in bed;with call bell/phone within reach ?Nurse Communication: Mobility status ?PT Visit Diagnosis: Difficulty in walking, not elsewhere classified (R26.2);Pain;Muscle weakness (generalized) (M62.81) ?Pain - Right/Left: Right ?Pain - part of body: Knee ?  ? ? ?Time: 1191-4782 ?PT Time Calculation (min) (ACUTE ONLY): 53 min ? ?Charges:  $Gait Training: 23-37 mins ?$Therapeutic Exercise: 8-22 mins ?$Therapeutic Activity: 8-22 mins          ?          ? ?Rolinda Roan, PT, DPT ?Acute  Rehabilitation Services ?Pager: [phone] ?Office: [phone]  ? ? ?Thelma Comp ?08/02/2021, 1:24 PM ? ?

## 2021-08-02 NOTE — TOC Progression Note (Signed)
Transition of Care (TOC) - Progression Note  ? ? ?Patient Details  ?Name: Danielle [name] ?MRN: [id number] ?Date of Birth: 03-Mar-1946 ? ?Transition of Care (TOC) CM/SW Contact  ?Joanne Chars, LCSW ?Phone Number: ?08/02/2021, 12:56 PM ? ?Clinical Narrative:   CSW received call from [place] at [facility] that [name] has been received for pt from [place].  CSW spoke with [name] and informed her, she wants to move forward with DC to [place].  MD informed. ? ? ? ?Expected Discharge Plan: [place] ?Barriers to Discharge: Barriers Resolved ? ?Expected Discharge Plan and Services ?Expected Discharge Plan: [place] ?In-house Referral: Clinical Social Work ?Discharge Planning Services: CM Consult ?Post Acute Care Choice: [place] ?Living arrangements for the past 2 months: Apartment ?Expected Discharge Date: 08/02/21               ?  ?  ?  ?  ?  ?HH Arranged: RN, PT, OT, Social Work ?[place] Agency: [facility] ?Date HH Agency Contacted: 08/01/21 ?Time [place]: 5176 ?Representative spoke with at [place]: Levada Dy ? ? ?Social Determinants of Health (SDOH) Interventions ?  ? ?Readmission Risk Interventions ?No flowsheet data found. ? ?

## 2021-08-02 NOTE — TOC Transition Note (Signed)
Transition of Care (TOC) - CM/SW Discharge Note ? ? ?Patient Details  ?Name: Danielle [name] ?MRN: [id number] ?Date of Birth: 1945/07/14 ? ?Transition of Care [facility]) CM/SW Contact:  ?Joanne Chars, LCSW ?Phone Number: ?08/02/2021, 12:55 PM ? ? ?Clinical Narrative:   Pt discharging to [facility].  RN call report to [phone].   ? ? ? ?Final next level of care: [place] ?Barriers to Discharge: Barriers Resolved ? ? ?Patient Goals and CMS Choice ?  ?CMS Medicare.gov Compare Post Acute Care list provided to:: Patient ?Choice offered to / list presented to : Patient ? ?Discharge Placement ?  ?           ?Patient chooses bed at:  [facility]) ?Patient to be transferred to facility by: [facility] ?Name of family member notified: [name] ?Patient and family notified of of transfer: 08/02/21 ? ?Discharge Plan and Services ?In-house Referral: Clinical Social Work ?Discharge Planning Services: CM Consult ?Post Acute Care Choice: [place]          ?  ?  ?  ?  ?  ?HH Arranged: RN, PT, OT, Social Work ?[place] Agency: [facility] ?Date HH Agency Contacted: 08/01/21 ?Time [place]: [id number] ?Representative spoke with at [place]: Levada Dy ? ?Social Determinants of Health (SDOH) Interventions ?  ? ? ?Readmission Risk Interventions ?No flowsheet data found. ? ? ? ? ?

## 2021-08-02 NOTE — Telephone Encounter (Signed)
Patient being discharged from community paramedicine program through HF clinic as she is being sent to SNF. I will re-establish once she is seen in clinic again.  ?

## 2021-08-12 DIAGNOSIS — E1165 Type 2 diabetes mellitus with hyperglycemia: Secondary | ICD-10-CM | POA: Diagnosis not present

## 2021-08-14 DIAGNOSIS — N183 Chronic kidney disease, stage 3 unspecified: Secondary | ICD-10-CM | POA: Diagnosis not present

## 2021-08-14 DIAGNOSIS — I129 Hypertensive chronic kidney disease with stage 1 through stage 4 chronic kidney disease, or unspecified chronic kidney disease: Secondary | ICD-10-CM | POA: Diagnosis not present

## 2021-08-14 DIAGNOSIS — M00061 Staphylococcal arthritis, right knee: Secondary | ICD-10-CM | POA: Diagnosis not present

## 2021-08-14 DIAGNOSIS — E039 Hypothyroidism, unspecified: Secondary | ICD-10-CM | POA: Diagnosis not present

## 2021-08-14 DIAGNOSIS — E785 Hyperlipidemia, unspecified: Secondary | ICD-10-CM | POA: Diagnosis not present

## 2021-08-14 DIAGNOSIS — D62 Acute posthemorrhagic anemia: Secondary | ICD-10-CM | POA: Diagnosis not present

## 2021-08-14 DIAGNOSIS — I82622 Acute embolism and thrombosis of deep veins of left upper extremity: Secondary | ICD-10-CM | POA: Diagnosis not present

## 2021-08-15 ENCOUNTER — Telehealth: Payer: Self-pay | Admitting: Internal Medicine

## 2021-08-15 ENCOUNTER — Telehealth: Payer: Self-pay

## 2021-08-15 DIAGNOSIS — T8453XD Infection and inflammatory reaction due to internal right knee prosthesis, subsequent encounter: Secondary | ICD-10-CM | POA: Diagnosis not present

## 2021-08-15 NOTE — Telephone Encounter (Signed)
-----   Message from Tomi Bamberger, [place] sent at 08/15/2021  1:43 PM EDT ----- ?Hey can someone call and schedule patient appointment I believe it is for PJI right knee. ?----- Message ----- ?From: Laurice Record, MD ?Sent: 08/15/2021  12:39 PM EDT ?To: [facility] Triage Nurse Pool ? ?Hi, ?Could this pt get follow-up with me? I have openings tomorrow ?TY ? ? ?

## 2021-08-15 NOTE — Telephone Encounter (Signed)
Called patient to schedule appointment with Dr. Candiss Norse, patient is in Physical therapy and was unable to schedule an appointment at time, Will try again at a later time.  ?

## 2021-08-17 DIAGNOSIS — M00061 Staphylococcal arthritis, right knee: Secondary | ICD-10-CM | POA: Diagnosis not present

## 2021-08-17 DIAGNOSIS — G4733 Obstructive sleep apnea (adult) (pediatric): Secondary | ICD-10-CM | POA: Diagnosis not present

## 2021-08-17 DIAGNOSIS — I82622 Acute embolism and thrombosis of deep veins of left upper extremity: Secondary | ICD-10-CM | POA: Diagnosis not present

## 2021-08-17 DIAGNOSIS — D62 Acute posthemorrhagic anemia: Secondary | ICD-10-CM | POA: Diagnosis not present

## 2021-08-17 DIAGNOSIS — I480 Paroxysmal atrial fibrillation: Secondary | ICD-10-CM | POA: Diagnosis not present

## 2021-08-17 DIAGNOSIS — E785 Hyperlipidemia, unspecified: Secondary | ICD-10-CM | POA: Diagnosis not present

## 2021-08-17 DIAGNOSIS — E039 Hypothyroidism, unspecified: Secondary | ICD-10-CM | POA: Diagnosis not present

## 2021-08-17 DIAGNOSIS — E119 Type 2 diabetes mellitus without complications: Secondary | ICD-10-CM | POA: Diagnosis not present

## 2021-08-19 DIAGNOSIS — R300 Dysuria: Secondary | ICD-10-CM | POA: Diagnosis not present

## 2021-08-21 DIAGNOSIS — G4733 Obstructive sleep apnea (adult) (pediatric): Secondary | ICD-10-CM | POA: Diagnosis not present

## 2021-08-21 DIAGNOSIS — N183 Chronic kidney disease, stage 3 unspecified: Secondary | ICD-10-CM | POA: Diagnosis not present

## 2021-08-21 DIAGNOSIS — M00061 Staphylococcal arthritis, right knee: Secondary | ICD-10-CM | POA: Diagnosis not present

## 2021-08-21 DIAGNOSIS — E039 Hypothyroidism, unspecified: Secondary | ICD-10-CM | POA: Diagnosis not present

## 2021-08-21 DIAGNOSIS — D62 Acute posthemorrhagic anemia: Secondary | ICD-10-CM | POA: Diagnosis not present

## 2021-08-21 DIAGNOSIS — I82622 Acute embolism and thrombosis of deep veins of left upper extremity: Secondary | ICD-10-CM | POA: Diagnosis not present

## 2021-08-21 DIAGNOSIS — E119 Type 2 diabetes mellitus without complications: Secondary | ICD-10-CM | POA: Diagnosis not present

## 2021-08-21 DIAGNOSIS — R300 Dysuria: Secondary | ICD-10-CM | POA: Diagnosis not present

## 2021-08-21 DIAGNOSIS — I480 Paroxysmal atrial fibrillation: Secondary | ICD-10-CM | POA: Diagnosis not present

## 2021-08-21 DIAGNOSIS — E785 Hyperlipidemia, unspecified: Secondary | ICD-10-CM | POA: Diagnosis not present

## 2021-08-23 ENCOUNTER — Telehealth (HOSPITAL_COMMUNITY): Payer: Self-pay | Admitting: Licensed Clinical Social Worker

## 2021-08-23 NOTE — Telephone Encounter (Signed)
Entered in error

## 2021-08-28 DIAGNOSIS — N183 Chronic kidney disease, stage 3 unspecified: Secondary | ICD-10-CM | POA: Diagnosis not present

## 2021-08-28 DIAGNOSIS — I509 Heart failure, unspecified: Secondary | ICD-10-CM | POA: Diagnosis not present

## 2021-08-28 DIAGNOSIS — E119 Type 2 diabetes mellitus without complications: Secondary | ICD-10-CM | POA: Diagnosis not present

## 2021-08-28 DIAGNOSIS — M00061 Staphylococcal arthritis, right knee: Secondary | ICD-10-CM | POA: Diagnosis not present

## 2021-08-28 DIAGNOSIS — D62 Acute posthemorrhagic anemia: Secondary | ICD-10-CM | POA: Diagnosis not present

## 2021-08-28 DIAGNOSIS — H6690 Otitis media, unspecified, unspecified ear: Secondary | ICD-10-CM | POA: Diagnosis not present

## 2021-08-28 DIAGNOSIS — I480 Paroxysmal atrial fibrillation: Secondary | ICD-10-CM | POA: Diagnosis not present

## 2021-08-28 DIAGNOSIS — I129 Hypertensive chronic kidney disease with stage 1 through stage 4 chronic kidney disease, or unspecified chronic kidney disease: Secondary | ICD-10-CM | POA: Diagnosis not present

## 2021-08-28 DIAGNOSIS — E039 Hypothyroidism, unspecified: Secondary | ICD-10-CM | POA: Diagnosis not present

## 2021-08-28 DIAGNOSIS — G4733 Obstructive sleep apnea (adult) (pediatric): Secondary | ICD-10-CM | POA: Diagnosis not present

## 2021-08-31 ENCOUNTER — Encounter: Payer: Self-pay | Admitting: Internal Medicine

## 2021-08-31 ENCOUNTER — Ambulatory Visit (INDEPENDENT_AMBULATORY_CARE_PROVIDER_SITE_OTHER): Payer: Medicare HMO | Admitting: Internal Medicine

## 2021-08-31 ENCOUNTER — Other Ambulatory Visit: Payer: Self-pay

## 2021-08-31 VITALS — BP 128/73 | HR 73 | Temp 98.2°F

## 2021-08-31 DIAGNOSIS — T8453XD Infection and inflammatory reaction due to internal right knee prosthesis, subsequent encounter: Secondary | ICD-10-CM

## 2021-08-31 DIAGNOSIS — T8450XD Infection and inflammatory reaction due to unspecified internal joint prosthesis, subsequent encounter: Secondary | ICD-10-CM

## 2021-08-31 MED ORDER — CEFADROXIL 1 G PO TABS: 1.0000 g | ORAL_TABLET | Freq: Two times a day (BID) | ORAL | 0 refills | Status: AC

## 2021-08-31 NOTE — Patient Instructions (Signed)
Due to recurrent infection and staph being bad, let's have a joint discussion between me, you, and your ortho surgeon ? ? ?Typically we do 6 weeks of antibiotics, and then if your labs look normal, and you feel well, we aspirate and culture the knee joint 2 weeks (not sooner) to see if bacteria still grows. If it doesn't then that's as best as we can say you are ready for your new joint ? ? ?There is some recent data, not as extensive and not yet validated with other studies that suggest to do 12 weeks of antibiotics ? ? ? ?So i'll start you on oral pills now, and will need to trend labs so I know with more certainty when a good time will be. If your surgeon wants to do the operation sooner, we can stop the oral pills and check the knee culture 2 weeks after we stop ? ? ?So let me know (see me in around 3 weeks ?

## 2021-08-31 NOTE — Progress Notes (Addendum)
?  ? ? ? ? ?[place] for Infectious Disease ? ?Patient Active Problem List  ? Diagnosis Date Noted  ? DVT (deep venous thrombosis) ([place]) 07/31/2021  ? Acute blood loss anemia   ? Septic Arthritis, Infection of prosthetic right knee joint ([place]), MSSA infection 07/10/2021  ? Complication of internal right knee prosthesis ([place]) 07/10/2021  ? CAD (coronary artery disease) 06/08/2021  ? Carotid artery disease ([place]) 06/08/2021  ? Symptomatic bradycardia 06/08/2021  ? Paroxysmal atrial fibrillation ([place]) 06/08/2021  ? CKD (chronic kidney disease), stage III ([place]) 06/08/2021  ? Polyneuropathy associated with underlying disease ([place]) 05/04/2020  ? Severe nonproliferative diabetic retinopathy of left eye, with macular edema, associated with type 2 diabetes mellitus ([place]) 11/05/2019  ? Severe nonproliferative diabetic retinopathy of right eye, with macular edema, associated with type 2 diabetes mellitus ([place]) 11/05/2019  ? Retinal hemorrhage of right eye 11/05/2019  ? Retinal hemorrhage of left eye 11/05/2019  ? Diabetes mellitus ([place]) 05/14/2019  ? 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 Type 2 diabetes mellitus with diabetic polyneuropathy, with long-term current use of insulin ([place]) 05/14/2019  ? History of gout 05/20/2018  ? Primary localized osteoarthritis of right knee 05/13/2018  ? 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 Obesity (BMI 30-39.9) 11/11/2017  ? OSA (obstructive sleep apnea) 02/18/2017  ? Essential hypertension   ? L Breast Cancer   ? Hypothyroidism   ? ? ? ? ?Subjective:  ? ? Patient ID: Saretta Dahlem, female    DOB: 25-Jan-1946, 76 y.o.   MRN: [id number] ? ?Cc: f/u hospital admission for MSSA PJI ? ?HPI: ? ?Maddalyn Lutze is a 76 y.o. female obese, hfrEF, faib on eliquis, ckd3, here for f/u recurrent right knee mssa pji ? ?I reviewed notes from recent hospital admissions in 06/2021 ?She was evaluated by my  partner dr Juleen China ?"S/p DAIR 03/2021 with or cx mssa; s/p 6 weeks cefazolin; on suppressive cephalexin. Recurrent infection in setting of being out of abx for 1 week. S/p explant with abx spacer 07/12/2021 -- or cx again with mssa." ?Afib on eliquis -- rifampin not used previously due to ddi ? ?She was supposed to finish iv abx on 08/23/21 ? ?She missed a follow up clinic appointment recently. ? ?She just left the nursing home today ([facility]) ? ?Ortho is dr Zachery Dakins ? ?------ ?08/31/21 id clinic visit ?She was in snf previously and our office was unable to get labs ?Our staff is trying to get the labs again as of now ?She has occasional severe pain  ?No f/c ?No n/v/diarrhea ?No wound break down/redness/swelling ?She is yet to make f/u ortho clinic appointment ? ? ?Allergies  ?Allergen Reactions  ? Other Other (See Comments)  ?  Blood Product Refusal (Jehovah's witness) ? ?  ? Tape Other (See Comments)  ?  Leaves blisters and marks on the skin  ? Sulfa Antibiotics Rash  ? Uloric [Febuxostat] Rash  ? ? ? ? ?Outpatient Medications Prior to Visit  ?Medication Sig Dispense Refill  ? acetaminophen (TYLENOL) 500 MG tablet Take 500-1,000 mg by mouth every 6 (six) hours as needed (for pain or headaches).    ? allopurinol (ZYLOPRIM) 100 MG tablet Take 100 mg by mouth daily.    ? apixaban (ELIQUIS) 5 MG TABS tablet Take 1 tablet (5 mg total) by mouth 2 (two) times daily. 60 tablet 11  ? atorvastatin (LIPITOR) 80 MG tablet  Take 1 tablet (80 mg total) by mouth at bedtime. 90 tablet 3  ? Biotin 10 MG TABS Take 10 mg by mouth daily.    ? Blood Glucose Monitoring Suppl (ONETOUCH VERIO) w/Device KIT Use to check blood sugar 1 kit 0  ? carvedilol (COREG) 3.125 MG tablet Take 1 tablet (3.125 mg total) by mouth 2 (two) times daily with a meal.    ? ceFAZolin (ANCEF) IVPB Inject 2 g into the vein every 8 (eight) hours. Indication:  R-knee MSSA PJI ?First Dose: Yes ?Last Day of Therapy:  08/23/21 ?Labs - Once weekly:  CBC/D  and BMP, ?Labs - Every other week:  ESR and CRP ?Method of administration: IV Push ?Pull PICC line at the completion of therapy ?Method of administration may be changed at the discretion of home infusion pharmacist based upon assessment of the patient and/or caregiver's ability to self-administer the medication ordered. 110 Units 0  ? clotrimazole-betamethasone (LOTRISONE) lotion Apply 1 application topically 2 (two) times daily as needed (itching).    ? Continuous Blood Gluc Sensor (DEXCOM G6 SENSOR) MISC 1 Device by Does not apply route as directed. 9 each 3  ? Continuous Blood Gluc Transmit (DEXCOM G6 TRANSMITTER) MISC 1 Device by Does not apply route as directed. 1 each 3  ? empagliflozin (JARDIANCE) 10 MG TABS tablet Take 10 mg by mouth daily.    ? Ferrous Sulfate (IRON) 325 (65 Fe) MG TABS Take 1 tablet (325 mg total) by mouth 2 (two) times daily before a meal.    ? folic acid (FOLVITE) 1 MG tablet Take 1 tablet (1 mg total) by mouth daily. 30 tablet 0  ? gabapentin (NEURONTIN) 300 MG capsule Take 1 capsule (300 mg total) by mouth 3 (three) times daily.    ? Insulin Glargine (BASAGLAR KWIKPEN) 100 UNIT/ML Inject 40 Units into the skin at bedtime.    ? insulin lispro (HUMALOG) 100 UNIT/ML injection Inject 0.05 mLs (5 Units total) into the skin 3 (three) times daily before meals. (Patient taking differently: Inject 5-15 Units into the skin 3 (three) times daily before meals. Per sliding scale) 10 mL 0  ? latanoprost (XALATAN) 0.005 % ophthalmic solution Place 1 drop into both eyes at bedtime.    ? levothyroxine (SYNTHROID, LEVOTHROID) 75 MCG tablet Take 75 mcg by mouth daily before breakfast.    ? LUMIGAN 0.01 % SOLN Place 1 drop into both eyes at bedtime.    ? Multiple Vitamins-Minerals (ALIVE ONCE DAILY WOMENS 50+ PO) Take 1 tablet by mouth daily.    ? ondansetron (ZOFRAN) 4 MG tablet Take 1 tablet (4 mg total) by mouth every 6 (six) hours as needed for nausea. 20 tablet 0  ? oxyCODONE 10 MG TABS Take 1 tablet  (10 mg total) by mouth every 4 (four) hours as needed for moderate pain or severe pain. 15 tablet 0  ? polyethylene glycol (MIRALAX / GLYCOLAX) 17 g packet Take 17 g by mouth daily. 14 each 0  ? senna (SENOKOT) 8.6 MG TABS tablet Take 1 tablet (8.6 mg total) by mouth 2 (two) times daily. For constipation.  0  ? thiamine 100 MG tablet Take 1 tablet (100 mg total) by mouth daily. 30 tablet 0  ? torsemide (DEMADEX) 20 MG tablet Take 2 tablets (40 mg total) by mouth every other day.    ? Travoprost, BAK Free, (TRAVATAN) 0.004 % SOLN ophthalmic solution Place 1 drop into both eyes at bedtime.     ? ?No facility-administered  medications prior to visit.  ? ? ? ?Social History  ? ?Socioeconomic History  ? Marital status: Widowed  ?  Spouse name: Not on file  ? Number of children: 3  ? Years of education: Not on file  ? Highest education level: Some college, no degree  ?Occupational History  ? Occupation: retired  ?Tobacco Use  ? Smoking status: Former  ?  Packs/day: 1.00  ?  Years: 22.00  ?  Pack years: 22.00  ?  Types: Cigarettes  ?  Quit date: 21  ?  Years since quitting: 40.2  ? Smokeless tobacco: Never  ?Vaping Use  ? Vaping Use: Never used  ?Substance and Sexual Activity  ? Alcohol use: Yes  ?  Comment: 05/22/2018 "glass of wine couple times/wk"  ? Drug use: Not Currently  ? Sexual activity: Not Currently  ?  Birth control/protection: None, Post-menopausal  ?Other Topics Concern  ? Not on file  ?Social History Narrative  ? Not on file  ? ?Social Determinants of Health  ? ?Financial Resource Strain: Not on file  ?Food Insecurity: Not on file  ?Transportation Needs: Not on file  ?Physical Activity: Not on file  ?Stress: Not on file  ?Social Connections: Not on file  ?Intimate Partner Violence: Not on file  ? ? ? ? ?Review of Systems ?   ?All other ros negative ? ?Objective:  ?  ?BP 128/73   Pulse 73   Temp 98.2 ?F (36.8 ?C) (Temporal)  ?Nursing note and vital signs reviewed. ? ?Physical Exam ? ?    ?General/constitutional: no distress, pleasant, in wheel chair ?HEENT: Normocephalic, PER, Conj Clear, EOMI, Oropharynx clear ?Neck supple ?CV: rrr no mrg ?Lungs: clear to auscultation, normal respiratory effort ?Abd: Soft, N

## 2021-09-01 DIAGNOSIS — T8459XD Infection and inflammatory reaction due to other internal joint prosthesis, subsequent encounter: Secondary | ICD-10-CM | POA: Diagnosis not present

## 2021-09-01 DIAGNOSIS — T8453XD Infection and inflammatory reaction due to internal right knee prosthesis, subsequent encounter: Secondary | ICD-10-CM | POA: Diagnosis not present

## 2021-09-01 DIAGNOSIS — I5042 Chronic combined systolic (congestive) and diastolic (congestive) heart failure: Secondary | ICD-10-CM | POA: Diagnosis not present

## 2021-09-01 DIAGNOSIS — E1165 Type 2 diabetes mellitus with hyperglycemia: Secondary | ICD-10-CM | POA: Diagnosis not present

## 2021-09-01 DIAGNOSIS — I890 Lymphedema, not elsewhere classified: Secondary | ICD-10-CM | POA: Diagnosis not present

## 2021-09-01 DIAGNOSIS — N1831 Chronic kidney disease, stage 3a: Secondary | ICD-10-CM | POA: Diagnosis not present

## 2021-09-01 DIAGNOSIS — R001 Bradycardia, unspecified: Secondary | ICD-10-CM | POA: Diagnosis not present

## 2021-09-01 DIAGNOSIS — I480 Paroxysmal atrial fibrillation: Secondary | ICD-10-CM | POA: Diagnosis not present

## 2021-09-01 DIAGNOSIS — E1142 Type 2 diabetes mellitus with diabetic polyneuropathy: Secondary | ICD-10-CM | POA: Diagnosis not present

## 2021-09-01 DIAGNOSIS — I130 Hypertensive heart and chronic kidney disease with heart failure and stage 1 through stage 4 chronic kidney disease, or unspecified chronic kidney disease: Secondary | ICD-10-CM | POA: Diagnosis not present

## 2021-09-01 DIAGNOSIS — E039 Hypothyroidism, unspecified: Secondary | ICD-10-CM | POA: Diagnosis not present

## 2021-09-01 DIAGNOSIS — I2510 Atherosclerotic heart disease of native coronary artery without angina pectoris: Secondary | ICD-10-CM | POA: Diagnosis not present

## 2021-09-01 DIAGNOSIS — B9561 Methicillin susceptible Staphylococcus aureus infection as the cause of diseases classified elsewhere: Secondary | ICD-10-CM | POA: Diagnosis not present

## 2021-09-01 DIAGNOSIS — D62 Acute posthemorrhagic anemia: Secondary | ICD-10-CM | POA: Diagnosis not present

## 2021-09-01 DIAGNOSIS — E1122 Type 2 diabetes mellitus with diabetic chronic kidney disease: Secondary | ICD-10-CM | POA: Diagnosis not present

## 2021-09-01 DIAGNOSIS — M10331 Gout due to renal impairment, right wrist: Secondary | ICD-10-CM | POA: Diagnosis not present

## 2021-09-01 DIAGNOSIS — I255 Ischemic cardiomyopathy: Secondary | ICD-10-CM | POA: Diagnosis not present

## 2021-09-01 DIAGNOSIS — G4733 Obstructive sleep apnea (adult) (pediatric): Secondary | ICD-10-CM | POA: Diagnosis not present

## 2021-09-01 DIAGNOSIS — E113513 Type 2 diabetes mellitus with proliferative diabetic retinopathy with macular edema, bilateral: Secondary | ICD-10-CM | POA: Diagnosis not present

## 2021-09-01 DIAGNOSIS — D631 Anemia in chronic kidney disease: Secondary | ICD-10-CM | POA: Diagnosis not present

## 2021-09-01 DIAGNOSIS — I82622 Acute embolism and thrombosis of deep veins of left upper extremity: Secondary | ICD-10-CM | POA: Diagnosis not present

## 2021-09-01 DIAGNOSIS — M00061 Staphylococcal arthritis, right knee: Secondary | ICD-10-CM | POA: Diagnosis not present

## 2021-09-01 DIAGNOSIS — D509 Iron deficiency anemia, unspecified: Secondary | ICD-10-CM | POA: Diagnosis not present

## 2021-09-01 DIAGNOSIS — Z6833 Body mass index (BMI) 33.0-33.9, adult: Secondary | ICD-10-CM | POA: Diagnosis not present

## 2021-09-01 DIAGNOSIS — E785 Hyperlipidemia, unspecified: Secondary | ICD-10-CM | POA: Diagnosis not present

## 2021-09-01 LAB — BASIC METABOLIC PANEL
BUN/Creatinine Ratio: 19 (calc) (ref 6–22)
BUN: 21 mg/dL (ref 7–25)
CO2: 29 mmol/L (ref 20–32)
Calcium: 9.6 mg/dL (ref 8.6–10.4)
Chloride: 98 mmol/L (ref 98–110)
Creat: 1.1 mg/dL — ABNORMAL HIGH (ref 0.60–1.00)
Glucose, Bld: 154 mg/dL — ABNORMAL HIGH (ref 65–99)
Potassium: 4.2 mmol/L (ref 3.5–5.3)
Sodium: 139 mmol/L (ref 135–146)

## 2021-09-01 LAB — C-REACTIVE PROTEIN: CRP: 5.4 mg/L (ref <8.0)

## 2021-09-04 DIAGNOSIS — R001 Bradycardia, unspecified: Secondary | ICD-10-CM | POA: Diagnosis not present

## 2021-09-04 DIAGNOSIS — E039 Hypothyroidism, unspecified: Secondary | ICD-10-CM | POA: Diagnosis not present

## 2021-09-04 DIAGNOSIS — I130 Hypertensive heart and chronic kidney disease with heart failure and stage 1 through stage 4 chronic kidney disease, or unspecified chronic kidney disease: Secondary | ICD-10-CM | POA: Diagnosis not present

## 2021-09-04 DIAGNOSIS — I890 Lymphedema, not elsewhere classified: Secondary | ICD-10-CM | POA: Diagnosis not present

## 2021-09-04 DIAGNOSIS — E1165 Type 2 diabetes mellitus with hyperglycemia: Secondary | ICD-10-CM | POA: Diagnosis not present

## 2021-09-04 DIAGNOSIS — I5042 Chronic combined systolic (congestive) and diastolic (congestive) heart failure: Secondary | ICD-10-CM | POA: Diagnosis not present

## 2021-09-04 DIAGNOSIS — E1142 Type 2 diabetes mellitus with diabetic polyneuropathy: Secondary | ICD-10-CM | POA: Diagnosis not present

## 2021-09-04 DIAGNOSIS — M00061 Staphylococcal arthritis, right knee: Secondary | ICD-10-CM | POA: Diagnosis not present

## 2021-09-04 DIAGNOSIS — E1122 Type 2 diabetes mellitus with diabetic chronic kidney disease: Secondary | ICD-10-CM | POA: Diagnosis not present

## 2021-09-04 DIAGNOSIS — Z6833 Body mass index (BMI) 33.0-33.9, adult: Secondary | ICD-10-CM | POA: Diagnosis not present

## 2021-09-04 DIAGNOSIS — I2510 Atherosclerotic heart disease of native coronary artery without angina pectoris: Secondary | ICD-10-CM | POA: Diagnosis not present

## 2021-09-04 DIAGNOSIS — E785 Hyperlipidemia, unspecified: Secondary | ICD-10-CM | POA: Diagnosis not present

## 2021-09-04 DIAGNOSIS — M10331 Gout due to renal impairment, right wrist: Secondary | ICD-10-CM | POA: Diagnosis not present

## 2021-09-04 DIAGNOSIS — I82622 Acute embolism and thrombosis of deep veins of left upper extremity: Secondary | ICD-10-CM | POA: Diagnosis not present

## 2021-09-04 DIAGNOSIS — B9561 Methicillin susceptible Staphylococcus aureus infection as the cause of diseases classified elsewhere: Secondary | ICD-10-CM | POA: Diagnosis not present

## 2021-09-04 DIAGNOSIS — T8453XD Infection and inflammatory reaction due to internal right knee prosthesis, subsequent encounter: Secondary | ICD-10-CM | POA: Diagnosis not present

## 2021-09-04 DIAGNOSIS — I480 Paroxysmal atrial fibrillation: Secondary | ICD-10-CM | POA: Diagnosis not present

## 2021-09-04 DIAGNOSIS — D62 Acute posthemorrhagic anemia: Secondary | ICD-10-CM | POA: Diagnosis not present

## 2021-09-04 DIAGNOSIS — I255 Ischemic cardiomyopathy: Secondary | ICD-10-CM | POA: Diagnosis not present

## 2021-09-04 DIAGNOSIS — N1831 Chronic kidney disease, stage 3a: Secondary | ICD-10-CM | POA: Diagnosis not present

## 2021-09-04 DIAGNOSIS — D509 Iron deficiency anemia, unspecified: Secondary | ICD-10-CM | POA: Diagnosis not present

## 2021-09-04 DIAGNOSIS — G4733 Obstructive sleep apnea (adult) (pediatric): Secondary | ICD-10-CM | POA: Diagnosis not present

## 2021-09-04 DIAGNOSIS — E113513 Type 2 diabetes mellitus with proliferative diabetic retinopathy with macular edema, bilateral: Secondary | ICD-10-CM | POA: Diagnosis not present

## 2021-09-04 DIAGNOSIS — D631 Anemia in chronic kidney disease: Secondary | ICD-10-CM | POA: Diagnosis not present

## 2021-09-06 DIAGNOSIS — M009 Pyogenic arthritis, unspecified: Secondary | ICD-10-CM | POA: Diagnosis not present

## 2021-09-06 DIAGNOSIS — N1832 Chronic kidney disease, stage 3b: Secondary | ICD-10-CM | POA: Diagnosis not present

## 2021-09-06 DIAGNOSIS — I82409 Acute embolism and thrombosis of unspecified deep veins of unspecified lower extremity: Secondary | ICD-10-CM | POA: Diagnosis not present

## 2021-09-06 DIAGNOSIS — Z950 Presence of cardiac pacemaker: Secondary | ICD-10-CM | POA: Diagnosis not present

## 2021-09-06 DIAGNOSIS — D638 Anemia in other chronic diseases classified elsewhere: Secondary | ICD-10-CM | POA: Diagnosis not present

## 2021-09-06 DIAGNOSIS — I5020 Unspecified systolic (congestive) heart failure: Secondary | ICD-10-CM | POA: Diagnosis not present

## 2021-09-07 DIAGNOSIS — E785 Hyperlipidemia, unspecified: Secondary | ICD-10-CM | POA: Diagnosis not present

## 2021-09-07 DIAGNOSIS — I480 Paroxysmal atrial fibrillation: Secondary | ICD-10-CM | POA: Diagnosis not present

## 2021-09-07 DIAGNOSIS — M00061 Staphylococcal arthritis, right knee: Secondary | ICD-10-CM | POA: Diagnosis not present

## 2021-09-07 DIAGNOSIS — M10331 Gout due to renal impairment, right wrist: Secondary | ICD-10-CM | POA: Diagnosis not present

## 2021-09-07 DIAGNOSIS — I2510 Atherosclerotic heart disease of native coronary artery without angina pectoris: Secondary | ICD-10-CM | POA: Diagnosis not present

## 2021-09-07 DIAGNOSIS — B9561 Methicillin susceptible Staphylococcus aureus infection as the cause of diseases classified elsewhere: Secondary | ICD-10-CM | POA: Diagnosis not present

## 2021-09-07 DIAGNOSIS — E113513 Type 2 diabetes mellitus with proliferative diabetic retinopathy with macular edema, bilateral: Secondary | ICD-10-CM | POA: Diagnosis not present

## 2021-09-07 DIAGNOSIS — E039 Hypothyroidism, unspecified: Secondary | ICD-10-CM | POA: Diagnosis not present

## 2021-09-07 DIAGNOSIS — I82622 Acute embolism and thrombosis of deep veins of left upper extremity: Secondary | ICD-10-CM | POA: Diagnosis not present

## 2021-09-07 DIAGNOSIS — R001 Bradycardia, unspecified: Secondary | ICD-10-CM | POA: Diagnosis not present

## 2021-09-07 DIAGNOSIS — D509 Iron deficiency anemia, unspecified: Secondary | ICD-10-CM | POA: Diagnosis not present

## 2021-09-07 DIAGNOSIS — T8453XD Infection and inflammatory reaction due to internal right knee prosthesis, subsequent encounter: Secondary | ICD-10-CM | POA: Diagnosis not present

## 2021-09-07 DIAGNOSIS — E1165 Type 2 diabetes mellitus with hyperglycemia: Secondary | ICD-10-CM | POA: Diagnosis not present

## 2021-09-07 DIAGNOSIS — Z6833 Body mass index (BMI) 33.0-33.9, adult: Secondary | ICD-10-CM | POA: Diagnosis not present

## 2021-09-07 DIAGNOSIS — E1142 Type 2 diabetes mellitus with diabetic polyneuropathy: Secondary | ICD-10-CM | POA: Diagnosis not present

## 2021-09-07 DIAGNOSIS — I130 Hypertensive heart and chronic kidney disease with heart failure and stage 1 through stage 4 chronic kidney disease, or unspecified chronic kidney disease: Secondary | ICD-10-CM | POA: Diagnosis not present

## 2021-09-07 DIAGNOSIS — G4733 Obstructive sleep apnea (adult) (pediatric): Secondary | ICD-10-CM | POA: Diagnosis not present

## 2021-09-07 DIAGNOSIS — I255 Ischemic cardiomyopathy: Secondary | ICD-10-CM | POA: Diagnosis not present

## 2021-09-07 DIAGNOSIS — E1122 Type 2 diabetes mellitus with diabetic chronic kidney disease: Secondary | ICD-10-CM | POA: Diagnosis not present

## 2021-09-07 DIAGNOSIS — D62 Acute posthemorrhagic anemia: Secondary | ICD-10-CM | POA: Diagnosis not present

## 2021-09-07 DIAGNOSIS — I5042 Chronic combined systolic (congestive) and diastolic (congestive) heart failure: Secondary | ICD-10-CM | POA: Diagnosis not present

## 2021-09-07 DIAGNOSIS — D631 Anemia in chronic kidney disease: Secondary | ICD-10-CM | POA: Diagnosis not present

## 2021-09-07 DIAGNOSIS — N1831 Chronic kidney disease, stage 3a: Secondary | ICD-10-CM | POA: Diagnosis not present

## 2021-09-07 DIAGNOSIS — I890 Lymphedema, not elsewhere classified: Secondary | ICD-10-CM | POA: Diagnosis not present

## 2021-09-09 DIAGNOSIS — E1122 Type 2 diabetes mellitus with diabetic chronic kidney disease: Secondary | ICD-10-CM | POA: Diagnosis not present

## 2021-09-09 DIAGNOSIS — I890 Lymphedema, not elsewhere classified: Secondary | ICD-10-CM | POA: Diagnosis not present

## 2021-09-09 DIAGNOSIS — E039 Hypothyroidism, unspecified: Secondary | ICD-10-CM | POA: Diagnosis not present

## 2021-09-09 DIAGNOSIS — E785 Hyperlipidemia, unspecified: Secondary | ICD-10-CM | POA: Diagnosis not present

## 2021-09-09 DIAGNOSIS — R001 Bradycardia, unspecified: Secondary | ICD-10-CM | POA: Diagnosis not present

## 2021-09-09 DIAGNOSIS — D62 Acute posthemorrhagic anemia: Secondary | ICD-10-CM | POA: Diagnosis not present

## 2021-09-09 DIAGNOSIS — D509 Iron deficiency anemia, unspecified: Secondary | ICD-10-CM | POA: Diagnosis not present

## 2021-09-09 DIAGNOSIS — I130 Hypertensive heart and chronic kidney disease with heart failure and stage 1 through stage 4 chronic kidney disease, or unspecified chronic kidney disease: Secondary | ICD-10-CM | POA: Diagnosis not present

## 2021-09-09 DIAGNOSIS — M10331 Gout due to renal impairment, right wrist: Secondary | ICD-10-CM | POA: Diagnosis not present

## 2021-09-09 DIAGNOSIS — B9561 Methicillin susceptible Staphylococcus aureus infection as the cause of diseases classified elsewhere: Secondary | ICD-10-CM | POA: Diagnosis not present

## 2021-09-09 DIAGNOSIS — I480 Paroxysmal atrial fibrillation: Secondary | ICD-10-CM | POA: Diagnosis not present

## 2021-09-09 DIAGNOSIS — Z6833 Body mass index (BMI) 33.0-33.9, adult: Secondary | ICD-10-CM | POA: Diagnosis not present

## 2021-09-09 DIAGNOSIS — I2510 Atherosclerotic heart disease of native coronary artery without angina pectoris: Secondary | ICD-10-CM | POA: Diagnosis not present

## 2021-09-09 DIAGNOSIS — E1165 Type 2 diabetes mellitus with hyperglycemia: Secondary | ICD-10-CM | POA: Diagnosis not present

## 2021-09-09 DIAGNOSIS — T8453XD Infection and inflammatory reaction due to internal right knee prosthesis, subsequent encounter: Secondary | ICD-10-CM | POA: Diagnosis not present

## 2021-09-09 DIAGNOSIS — E1142 Type 2 diabetes mellitus with diabetic polyneuropathy: Secondary | ICD-10-CM | POA: Diagnosis not present

## 2021-09-09 DIAGNOSIS — D631 Anemia in chronic kidney disease: Secondary | ICD-10-CM | POA: Diagnosis not present

## 2021-09-09 DIAGNOSIS — G4733 Obstructive sleep apnea (adult) (pediatric): Secondary | ICD-10-CM | POA: Diagnosis not present

## 2021-09-09 DIAGNOSIS — E113513 Type 2 diabetes mellitus with proliferative diabetic retinopathy with macular edema, bilateral: Secondary | ICD-10-CM | POA: Diagnosis not present

## 2021-09-09 DIAGNOSIS — I5042 Chronic combined systolic (congestive) and diastolic (congestive) heart failure: Secondary | ICD-10-CM | POA: Diagnosis not present

## 2021-09-09 DIAGNOSIS — I255 Ischemic cardiomyopathy: Secondary | ICD-10-CM | POA: Diagnosis not present

## 2021-09-09 DIAGNOSIS — I82622 Acute embolism and thrombosis of deep veins of left upper extremity: Secondary | ICD-10-CM | POA: Diagnosis not present

## 2021-09-09 DIAGNOSIS — M00061 Staphylococcal arthritis, right knee: Secondary | ICD-10-CM | POA: Diagnosis not present

## 2021-09-09 DIAGNOSIS — N1831 Chronic kidney disease, stage 3a: Secondary | ICD-10-CM | POA: Diagnosis not present

## 2021-09-12 DIAGNOSIS — I480 Paroxysmal atrial fibrillation: Secondary | ICD-10-CM | POA: Diagnosis not present

## 2021-09-12 DIAGNOSIS — I130 Hypertensive heart and chronic kidney disease with heart failure and stage 1 through stage 4 chronic kidney disease, or unspecified chronic kidney disease: Secondary | ICD-10-CM | POA: Diagnosis not present

## 2021-09-12 DIAGNOSIS — T8453XD Infection and inflammatory reaction due to internal right knee prosthesis, subsequent encounter: Secondary | ICD-10-CM | POA: Diagnosis not present

## 2021-09-12 DIAGNOSIS — I2510 Atherosclerotic heart disease of native coronary artery without angina pectoris: Secondary | ICD-10-CM | POA: Diagnosis not present

## 2021-09-12 DIAGNOSIS — E113513 Type 2 diabetes mellitus with proliferative diabetic retinopathy with macular edema, bilateral: Secondary | ICD-10-CM | POA: Diagnosis not present

## 2021-09-12 DIAGNOSIS — I255 Ischemic cardiomyopathy: Secondary | ICD-10-CM | POA: Diagnosis not present

## 2021-09-12 DIAGNOSIS — E785 Hyperlipidemia, unspecified: Secondary | ICD-10-CM | POA: Diagnosis not present

## 2021-09-12 DIAGNOSIS — D62 Acute posthemorrhagic anemia: Secondary | ICD-10-CM | POA: Diagnosis not present

## 2021-09-12 DIAGNOSIS — I890 Lymphedema, not elsewhere classified: Secondary | ICD-10-CM | POA: Diagnosis not present

## 2021-09-12 DIAGNOSIS — D509 Iron deficiency anemia, unspecified: Secondary | ICD-10-CM | POA: Diagnosis not present

## 2021-09-12 DIAGNOSIS — M00061 Staphylococcal arthritis, right knee: Secondary | ICD-10-CM | POA: Diagnosis not present

## 2021-09-12 DIAGNOSIS — Z6833 Body mass index (BMI) 33.0-33.9, adult: Secondary | ICD-10-CM | POA: Diagnosis not present

## 2021-09-12 DIAGNOSIS — I82622 Acute embolism and thrombosis of deep veins of left upper extremity: Secondary | ICD-10-CM | POA: Diagnosis not present

## 2021-09-12 DIAGNOSIS — E1142 Type 2 diabetes mellitus with diabetic polyneuropathy: Secondary | ICD-10-CM | POA: Diagnosis not present

## 2021-09-12 DIAGNOSIS — G4733 Obstructive sleep apnea (adult) (pediatric): Secondary | ICD-10-CM | POA: Diagnosis not present

## 2021-09-12 DIAGNOSIS — D631 Anemia in chronic kidney disease: Secondary | ICD-10-CM | POA: Diagnosis not present

## 2021-09-12 DIAGNOSIS — E039 Hypothyroidism, unspecified: Secondary | ICD-10-CM | POA: Diagnosis not present

## 2021-09-12 DIAGNOSIS — I5042 Chronic combined systolic (congestive) and diastolic (congestive) heart failure: Secondary | ICD-10-CM | POA: Diagnosis not present

## 2021-09-12 DIAGNOSIS — E1122 Type 2 diabetes mellitus with diabetic chronic kidney disease: Secondary | ICD-10-CM | POA: Diagnosis not present

## 2021-09-12 DIAGNOSIS — R001 Bradycardia, unspecified: Secondary | ICD-10-CM | POA: Diagnosis not present

## 2021-09-12 DIAGNOSIS — E1165 Type 2 diabetes mellitus with hyperglycemia: Secondary | ICD-10-CM | POA: Diagnosis not present

## 2021-09-12 DIAGNOSIS — B9561 Methicillin susceptible Staphylococcus aureus infection as the cause of diseases classified elsewhere: Secondary | ICD-10-CM | POA: Diagnosis not present

## 2021-09-12 DIAGNOSIS — M10331 Gout due to renal impairment, right wrist: Secondary | ICD-10-CM | POA: Diagnosis not present

## 2021-09-12 DIAGNOSIS — N1831 Chronic kidney disease, stage 3a: Secondary | ICD-10-CM | POA: Diagnosis not present

## 2021-09-13 DIAGNOSIS — I130 Hypertensive heart and chronic kidney disease with heart failure and stage 1 through stage 4 chronic kidney disease, or unspecified chronic kidney disease: Secondary | ICD-10-CM | POA: Diagnosis not present

## 2021-09-13 DIAGNOSIS — T8453XD Infection and inflammatory reaction due to internal right knee prosthesis, subsequent encounter: Secondary | ICD-10-CM | POA: Diagnosis not present

## 2021-09-13 DIAGNOSIS — I255 Ischemic cardiomyopathy: Secondary | ICD-10-CM | POA: Diagnosis not present

## 2021-09-13 DIAGNOSIS — G4733 Obstructive sleep apnea (adult) (pediatric): Secondary | ICD-10-CM | POA: Diagnosis not present

## 2021-09-13 DIAGNOSIS — E1142 Type 2 diabetes mellitus with diabetic polyneuropathy: Secondary | ICD-10-CM | POA: Diagnosis not present

## 2021-09-13 DIAGNOSIS — I82622 Acute embolism and thrombosis of deep veins of left upper extremity: Secondary | ICD-10-CM | POA: Diagnosis not present

## 2021-09-13 DIAGNOSIS — Z6833 Body mass index (BMI) 33.0-33.9, adult: Secondary | ICD-10-CM | POA: Diagnosis not present

## 2021-09-13 DIAGNOSIS — E113513 Type 2 diabetes mellitus with proliferative diabetic retinopathy with macular edema, bilateral: Secondary | ICD-10-CM | POA: Diagnosis not present

## 2021-09-13 DIAGNOSIS — E039 Hypothyroidism, unspecified: Secondary | ICD-10-CM | POA: Diagnosis not present

## 2021-09-13 DIAGNOSIS — E1165 Type 2 diabetes mellitus with hyperglycemia: Secondary | ICD-10-CM | POA: Diagnosis not present

## 2021-09-13 DIAGNOSIS — R001 Bradycardia, unspecified: Secondary | ICD-10-CM | POA: Diagnosis not present

## 2021-09-13 DIAGNOSIS — B9561 Methicillin susceptible Staphylococcus aureus infection as the cause of diseases classified elsewhere: Secondary | ICD-10-CM | POA: Diagnosis not present

## 2021-09-13 DIAGNOSIS — E785 Hyperlipidemia, unspecified: Secondary | ICD-10-CM | POA: Diagnosis not present

## 2021-09-13 DIAGNOSIS — I890 Lymphedema, not elsewhere classified: Secondary | ICD-10-CM | POA: Diagnosis not present

## 2021-09-13 DIAGNOSIS — M10331 Gout due to renal impairment, right wrist: Secondary | ICD-10-CM | POA: Diagnosis not present

## 2021-09-13 DIAGNOSIS — E1122 Type 2 diabetes mellitus with diabetic chronic kidney disease: Secondary | ICD-10-CM | POA: Diagnosis not present

## 2021-09-13 DIAGNOSIS — D631 Anemia in chronic kidney disease: Secondary | ICD-10-CM | POA: Diagnosis not present

## 2021-09-13 DIAGNOSIS — I2510 Atherosclerotic heart disease of native coronary artery without angina pectoris: Secondary | ICD-10-CM | POA: Diagnosis not present

## 2021-09-13 DIAGNOSIS — M00061 Staphylococcal arthritis, right knee: Secondary | ICD-10-CM | POA: Diagnosis not present

## 2021-09-13 DIAGNOSIS — I5042 Chronic combined systolic (congestive) and diastolic (congestive) heart failure: Secondary | ICD-10-CM | POA: Diagnosis not present

## 2021-09-13 DIAGNOSIS — N1831 Chronic kidney disease, stage 3a: Secondary | ICD-10-CM | POA: Diagnosis not present

## 2021-09-13 DIAGNOSIS — D509 Iron deficiency anemia, unspecified: Secondary | ICD-10-CM | POA: Diagnosis not present

## 2021-09-13 DIAGNOSIS — I480 Paroxysmal atrial fibrillation: Secondary | ICD-10-CM | POA: Diagnosis not present

## 2021-09-13 DIAGNOSIS — D62 Acute posthemorrhagic anemia: Secondary | ICD-10-CM | POA: Diagnosis not present

## 2021-09-14 DIAGNOSIS — B9561 Methicillin susceptible Staphylococcus aureus infection as the cause of diseases classified elsewhere: Secondary | ICD-10-CM | POA: Diagnosis not present

## 2021-09-14 DIAGNOSIS — T8453XD Infection and inflammatory reaction due to internal right knee prosthesis, subsequent encounter: Secondary | ICD-10-CM | POA: Diagnosis not present

## 2021-09-14 DIAGNOSIS — I255 Ischemic cardiomyopathy: Secondary | ICD-10-CM | POA: Diagnosis not present

## 2021-09-14 DIAGNOSIS — E1165 Type 2 diabetes mellitus with hyperglycemia: Secondary | ICD-10-CM | POA: Diagnosis not present

## 2021-09-14 DIAGNOSIS — E039 Hypothyroidism, unspecified: Secondary | ICD-10-CM | POA: Diagnosis not present

## 2021-09-14 DIAGNOSIS — E1142 Type 2 diabetes mellitus with diabetic polyneuropathy: Secondary | ICD-10-CM | POA: Diagnosis not present

## 2021-09-14 DIAGNOSIS — D62 Acute posthemorrhagic anemia: Secondary | ICD-10-CM | POA: Diagnosis not present

## 2021-09-14 DIAGNOSIS — I82622 Acute embolism and thrombosis of deep veins of left upper extremity: Secondary | ICD-10-CM | POA: Diagnosis not present

## 2021-09-14 DIAGNOSIS — I5042 Chronic combined systolic (congestive) and diastolic (congestive) heart failure: Secondary | ICD-10-CM | POA: Diagnosis not present

## 2021-09-14 DIAGNOSIS — E113513 Type 2 diabetes mellitus with proliferative diabetic retinopathy with macular edema, bilateral: Secondary | ICD-10-CM | POA: Diagnosis not present

## 2021-09-14 DIAGNOSIS — R001 Bradycardia, unspecified: Secondary | ICD-10-CM | POA: Diagnosis not present

## 2021-09-14 DIAGNOSIS — N1831 Chronic kidney disease, stage 3a: Secondary | ICD-10-CM | POA: Diagnosis not present

## 2021-09-14 DIAGNOSIS — I480 Paroxysmal atrial fibrillation: Secondary | ICD-10-CM | POA: Diagnosis not present

## 2021-09-14 DIAGNOSIS — M00061 Staphylococcal arthritis, right knee: Secondary | ICD-10-CM | POA: Diagnosis not present

## 2021-09-14 DIAGNOSIS — M10331 Gout due to renal impairment, right wrist: Secondary | ICD-10-CM | POA: Diagnosis not present

## 2021-09-14 DIAGNOSIS — I2510 Atherosclerotic heart disease of native coronary artery without angina pectoris: Secondary | ICD-10-CM | POA: Diagnosis not present

## 2021-09-14 DIAGNOSIS — I130 Hypertensive heart and chronic kidney disease with heart failure and stage 1 through stage 4 chronic kidney disease, or unspecified chronic kidney disease: Secondary | ICD-10-CM | POA: Diagnosis not present

## 2021-09-14 DIAGNOSIS — G4733 Obstructive sleep apnea (adult) (pediatric): Secondary | ICD-10-CM | POA: Diagnosis not present

## 2021-09-14 DIAGNOSIS — D631 Anemia in chronic kidney disease: Secondary | ICD-10-CM | POA: Diagnosis not present

## 2021-09-14 DIAGNOSIS — D509 Iron deficiency anemia, unspecified: Secondary | ICD-10-CM | POA: Diagnosis not present

## 2021-09-14 DIAGNOSIS — Z6833 Body mass index (BMI) 33.0-33.9, adult: Secondary | ICD-10-CM | POA: Diagnosis not present

## 2021-09-14 DIAGNOSIS — E785 Hyperlipidemia, unspecified: Secondary | ICD-10-CM | POA: Diagnosis not present

## 2021-09-14 DIAGNOSIS — E1122 Type 2 diabetes mellitus with diabetic chronic kidney disease: Secondary | ICD-10-CM | POA: Diagnosis not present

## 2021-09-14 DIAGNOSIS — I890 Lymphedema, not elsewhere classified: Secondary | ICD-10-CM | POA: Diagnosis not present

## 2021-09-15 DIAGNOSIS — Z6833 Body mass index (BMI) 33.0-33.9, adult: Secondary | ICD-10-CM | POA: Diagnosis not present

## 2021-09-15 DIAGNOSIS — E113513 Type 2 diabetes mellitus with proliferative diabetic retinopathy with macular edema, bilateral: Secondary | ICD-10-CM | POA: Diagnosis not present

## 2021-09-15 DIAGNOSIS — I82622 Acute embolism and thrombosis of deep veins of left upper extremity: Secondary | ICD-10-CM | POA: Diagnosis not present

## 2021-09-15 DIAGNOSIS — D631 Anemia in chronic kidney disease: Secondary | ICD-10-CM | POA: Diagnosis not present

## 2021-09-15 DIAGNOSIS — D62 Acute posthemorrhagic anemia: Secondary | ICD-10-CM | POA: Diagnosis not present

## 2021-09-15 DIAGNOSIS — I2510 Atherosclerotic heart disease of native coronary artery without angina pectoris: Secondary | ICD-10-CM | POA: Diagnosis not present

## 2021-09-15 DIAGNOSIS — T8453XD Infection and inflammatory reaction due to internal right knee prosthesis, subsequent encounter: Secondary | ICD-10-CM | POA: Diagnosis not present

## 2021-09-15 DIAGNOSIS — G4733 Obstructive sleep apnea (adult) (pediatric): Secondary | ICD-10-CM | POA: Diagnosis not present

## 2021-09-15 DIAGNOSIS — I130 Hypertensive heart and chronic kidney disease with heart failure and stage 1 through stage 4 chronic kidney disease, or unspecified chronic kidney disease: Secondary | ICD-10-CM | POA: Diagnosis not present

## 2021-09-15 DIAGNOSIS — R001 Bradycardia, unspecified: Secondary | ICD-10-CM | POA: Diagnosis not present

## 2021-09-15 DIAGNOSIS — D509 Iron deficiency anemia, unspecified: Secondary | ICD-10-CM | POA: Diagnosis not present

## 2021-09-15 DIAGNOSIS — E785 Hyperlipidemia, unspecified: Secondary | ICD-10-CM | POA: Diagnosis not present

## 2021-09-15 DIAGNOSIS — M10331 Gout due to renal impairment, right wrist: Secondary | ICD-10-CM | POA: Diagnosis not present

## 2021-09-15 DIAGNOSIS — E039 Hypothyroidism, unspecified: Secondary | ICD-10-CM | POA: Diagnosis not present

## 2021-09-15 DIAGNOSIS — I890 Lymphedema, not elsewhere classified: Secondary | ICD-10-CM | POA: Diagnosis not present

## 2021-09-15 DIAGNOSIS — N1831 Chronic kidney disease, stage 3a: Secondary | ICD-10-CM | POA: Diagnosis not present

## 2021-09-15 DIAGNOSIS — E1165 Type 2 diabetes mellitus with hyperglycemia: Secondary | ICD-10-CM | POA: Diagnosis not present

## 2021-09-15 DIAGNOSIS — B9561 Methicillin susceptible Staphylococcus aureus infection as the cause of diseases classified elsewhere: Secondary | ICD-10-CM | POA: Diagnosis not present

## 2021-09-15 DIAGNOSIS — I255 Ischemic cardiomyopathy: Secondary | ICD-10-CM | POA: Diagnosis not present

## 2021-09-15 DIAGNOSIS — I5042 Chronic combined systolic (congestive) and diastolic (congestive) heart failure: Secondary | ICD-10-CM | POA: Diagnosis not present

## 2021-09-15 DIAGNOSIS — E1142 Type 2 diabetes mellitus with diabetic polyneuropathy: Secondary | ICD-10-CM | POA: Diagnosis not present

## 2021-09-15 DIAGNOSIS — M00061 Staphylococcal arthritis, right knee: Secondary | ICD-10-CM | POA: Diagnosis not present

## 2021-09-15 DIAGNOSIS — E1122 Type 2 diabetes mellitus with diabetic chronic kidney disease: Secondary | ICD-10-CM | POA: Diagnosis not present

## 2021-09-15 DIAGNOSIS — I480 Paroxysmal atrial fibrillation: Secondary | ICD-10-CM | POA: Diagnosis not present

## 2021-09-19 DIAGNOSIS — M10331 Gout due to renal impairment, right wrist: Secondary | ICD-10-CM | POA: Diagnosis not present

## 2021-09-19 DIAGNOSIS — Z6833 Body mass index (BMI) 33.0-33.9, adult: Secondary | ICD-10-CM | POA: Diagnosis not present

## 2021-09-19 DIAGNOSIS — E1165 Type 2 diabetes mellitus with hyperglycemia: Secondary | ICD-10-CM | POA: Diagnosis not present

## 2021-09-19 DIAGNOSIS — I2510 Atherosclerotic heart disease of native coronary artery without angina pectoris: Secondary | ICD-10-CM | POA: Diagnosis not present

## 2021-09-19 DIAGNOSIS — D631 Anemia in chronic kidney disease: Secondary | ICD-10-CM | POA: Diagnosis not present

## 2021-09-19 DIAGNOSIS — I890 Lymphedema, not elsewhere classified: Secondary | ICD-10-CM | POA: Diagnosis not present

## 2021-09-19 DIAGNOSIS — I82622 Acute embolism and thrombosis of deep veins of left upper extremity: Secondary | ICD-10-CM | POA: Diagnosis not present

## 2021-09-19 DIAGNOSIS — E1122 Type 2 diabetes mellitus with diabetic chronic kidney disease: Secondary | ICD-10-CM | POA: Diagnosis not present

## 2021-09-19 DIAGNOSIS — I255 Ischemic cardiomyopathy: Secondary | ICD-10-CM | POA: Diagnosis not present

## 2021-09-19 DIAGNOSIS — M00061 Staphylococcal arthritis, right knee: Secondary | ICD-10-CM | POA: Diagnosis not present

## 2021-09-19 DIAGNOSIS — G4733 Obstructive sleep apnea (adult) (pediatric): Secondary | ICD-10-CM | POA: Diagnosis not present

## 2021-09-19 DIAGNOSIS — R001 Bradycardia, unspecified: Secondary | ICD-10-CM | POA: Diagnosis not present

## 2021-09-19 DIAGNOSIS — E039 Hypothyroidism, unspecified: Secondary | ICD-10-CM | POA: Diagnosis not present

## 2021-09-19 DIAGNOSIS — E1142 Type 2 diabetes mellitus with diabetic polyneuropathy: Secondary | ICD-10-CM | POA: Diagnosis not present

## 2021-09-19 DIAGNOSIS — E785 Hyperlipidemia, unspecified: Secondary | ICD-10-CM | POA: Diagnosis not present

## 2021-09-19 DIAGNOSIS — T8453XD Infection and inflammatory reaction due to internal right knee prosthesis, subsequent encounter: Secondary | ICD-10-CM | POA: Diagnosis not present

## 2021-09-19 DIAGNOSIS — I130 Hypertensive heart and chronic kidney disease with heart failure and stage 1 through stage 4 chronic kidney disease, or unspecified chronic kidney disease: Secondary | ICD-10-CM | POA: Diagnosis not present

## 2021-09-19 DIAGNOSIS — I480 Paroxysmal atrial fibrillation: Secondary | ICD-10-CM | POA: Diagnosis not present

## 2021-09-19 DIAGNOSIS — D509 Iron deficiency anemia, unspecified: Secondary | ICD-10-CM | POA: Diagnosis not present

## 2021-09-19 DIAGNOSIS — D62 Acute posthemorrhagic anemia: Secondary | ICD-10-CM | POA: Diagnosis not present

## 2021-09-19 DIAGNOSIS — E113513 Type 2 diabetes mellitus with proliferative diabetic retinopathy with macular edema, bilateral: Secondary | ICD-10-CM | POA: Diagnosis not present

## 2021-09-19 DIAGNOSIS — B9561 Methicillin susceptible Staphylococcus aureus infection as the cause of diseases classified elsewhere: Secondary | ICD-10-CM | POA: Diagnosis not present

## 2021-09-19 DIAGNOSIS — N1831 Chronic kidney disease, stage 3a: Secondary | ICD-10-CM | POA: Diagnosis not present

## 2021-09-19 DIAGNOSIS — I5042 Chronic combined systolic (congestive) and diastolic (congestive) heart failure: Secondary | ICD-10-CM | POA: Diagnosis not present

## 2021-09-20 ENCOUNTER — Telehealth (HOSPITAL_COMMUNITY): Payer: Self-pay | Admitting: *Deleted

## 2021-09-20 ENCOUNTER — Encounter (HOSPITAL_COMMUNITY): Payer: Self-pay | Admitting: Internal Medicine

## 2021-09-20 ENCOUNTER — Other Ambulatory Visit (HOSPITAL_COMMUNITY): Payer: Self-pay

## 2021-09-20 ENCOUNTER — Ambulatory Visit (HOSPITAL_COMMUNITY)
Admission: RE | Admit: 2021-09-20 | Discharge: 2021-09-20 | Disposition: A | Discharge status: Home / Self Care | Payer: Medicare HMO | Source: Ambulatory Visit | Attending: Internal Medicine | Admitting: Internal Medicine

## 2021-09-20 VITALS — BP 114/62 | HR 78 | Wt 206.4 lb

## 2021-09-20 DIAGNOSIS — Z7901 Long term (current) use of anticoagulants: Secondary | ICD-10-CM | POA: Diagnosis not present

## 2021-09-20 DIAGNOSIS — G4733 Obstructive sleep apnea (adult) (pediatric): Secondary | ICD-10-CM | POA: Insufficient documentation

## 2021-09-20 DIAGNOSIS — Z6833 Body mass index (BMI) 33.0-33.9, adult: Secondary | ICD-10-CM | POA: Diagnosis not present

## 2021-09-20 DIAGNOSIS — I10 Essential (primary) hypertension: Secondary | ICD-10-CM

## 2021-09-20 DIAGNOSIS — E785 Hyperlipidemia, unspecified: Secondary | ICD-10-CM | POA: Diagnosis not present

## 2021-09-20 DIAGNOSIS — Z794 Long term (current) use of insulin: Secondary | ICD-10-CM | POA: Diagnosis not present

## 2021-09-20 DIAGNOSIS — D631 Anemia in chronic kidney disease: Secondary | ICD-10-CM | POA: Diagnosis not present

## 2021-09-20 DIAGNOSIS — I5032 Chronic diastolic (congestive) heart failure: Secondary | ICD-10-CM | POA: Diagnosis not present

## 2021-09-20 DIAGNOSIS — Z950 Presence of cardiac pacemaker: Secondary | ICD-10-CM | POA: Insufficient documentation

## 2021-09-20 DIAGNOSIS — I480 Paroxysmal atrial fibrillation: Secondary | ICD-10-CM | POA: Diagnosis not present

## 2021-09-20 DIAGNOSIS — M00061 Staphylococcal arthritis, right knee: Secondary | ICD-10-CM | POA: Diagnosis not present

## 2021-09-20 DIAGNOSIS — E119 Type 2 diabetes mellitus without complications: Secondary | ICD-10-CM

## 2021-09-20 DIAGNOSIS — E1122 Type 2 diabetes mellitus with diabetic chronic kidney disease: Secondary | ICD-10-CM | POA: Insufficient documentation

## 2021-09-20 DIAGNOSIS — Z79899 Other long term (current) drug therapy: Secondary | ICD-10-CM | POA: Diagnosis not present

## 2021-09-20 DIAGNOSIS — E1142 Type 2 diabetes mellitus with diabetic polyneuropathy: Secondary | ICD-10-CM | POA: Diagnosis not present

## 2021-09-20 DIAGNOSIS — I2510 Atherosclerotic heart disease of native coronary artery without angina pectoris: Secondary | ICD-10-CM | POA: Diagnosis not present

## 2021-09-20 DIAGNOSIS — M10331 Gout due to renal impairment, right wrist: Secondary | ICD-10-CM | POA: Diagnosis not present

## 2021-09-20 DIAGNOSIS — D509 Iron deficiency anemia, unspecified: Secondary | ICD-10-CM | POA: Diagnosis not present

## 2021-09-20 DIAGNOSIS — Z7984 Long term (current) use of oral hypoglycemic drugs: Secondary | ICD-10-CM | POA: Diagnosis not present

## 2021-09-20 DIAGNOSIS — E1165 Type 2 diabetes mellitus with hyperglycemia: Secondary | ICD-10-CM | POA: Diagnosis not present

## 2021-09-20 DIAGNOSIS — I255 Ischemic cardiomyopathy: Secondary | ICD-10-CM | POA: Insufficient documentation

## 2021-09-20 DIAGNOSIS — E039 Hypothyroidism, unspecified: Secondary | ICD-10-CM | POA: Insufficient documentation

## 2021-09-20 DIAGNOSIS — N1831 Chronic kidney disease, stage 3a: Secondary | ICD-10-CM | POA: Diagnosis not present

## 2021-09-20 DIAGNOSIS — R001 Bradycardia, unspecified: Secondary | ICD-10-CM | POA: Diagnosis not present

## 2021-09-20 DIAGNOSIS — N1832 Chronic kidney disease, stage 3b: Secondary | ICD-10-CM | POA: Diagnosis not present

## 2021-09-20 DIAGNOSIS — E113513 Type 2 diabetes mellitus with proliferative diabetic retinopathy with macular edema, bilateral: Secondary | ICD-10-CM | POA: Diagnosis not present

## 2021-09-20 DIAGNOSIS — I130 Hypertensive heart and chronic kidney disease with heart failure and stage 1 through stage 4 chronic kidney disease, or unspecified chronic kidney disease: Secondary | ICD-10-CM | POA: Diagnosis not present

## 2021-09-20 DIAGNOSIS — B9561 Methicillin susceptible Staphylococcus aureus infection as the cause of diseases classified elsewhere: Secondary | ICD-10-CM | POA: Diagnosis not present

## 2021-09-20 DIAGNOSIS — D62 Acute posthemorrhagic anemia: Secondary | ICD-10-CM | POA: Diagnosis not present

## 2021-09-20 DIAGNOSIS — T8453XD Infection and inflammatory reaction due to internal right knee prosthesis, subsequent encounter: Secondary | ICD-10-CM | POA: Diagnosis not present

## 2021-09-20 DIAGNOSIS — I5042 Chronic combined systolic (congestive) and diastolic (congestive) heart failure: Secondary | ICD-10-CM | POA: Diagnosis not present

## 2021-09-20 DIAGNOSIS — I890 Lymphedema, not elsewhere classified: Secondary | ICD-10-CM | POA: Diagnosis not present

## 2021-09-20 DIAGNOSIS — Z955 Presence of coronary angioplasty implant and graft: Secondary | ICD-10-CM | POA: Diagnosis not present

## 2021-09-20 DIAGNOSIS — I82622 Acute embolism and thrombosis of deep veins of left upper extremity: Secondary | ICD-10-CM | POA: Diagnosis not present

## 2021-09-20 NOTE — Addendum Note (Signed)
Encounter addended by: Scarlette Calico, RN on: 09/20/2021 5:03 PM ? Actions taken: Order Reconciliation Section accessed

## 2021-09-20 NOTE — Addendum Note (Signed)
Encounter addended by: Jorge Ny, LCSW on: 09/20/2021 11:21 AM ? Actions taken: Flowsheet accepted, Clinical Note Signed

## 2021-09-20 NOTE — Progress Notes (Addendum)
? ?ADVANCED HF CLINIC NOTE ? ? ?PCP: Ronald Polite, MD ?EP: Dr. Camnitz ?Rheumatology: Dr. Hawke ?Primary HF Cardiologist: Dr. Bensimhon ? ?HPI: ?Danielle [name] is a 76 y.o. female with a past medical history of systolic HF (EF 45-50%), CAD s/p multiple stents in [place] (with h/o LAD stent thrombosis), ischemic cardiomyopathy (EF 10% at one point), symptomatic bradycardia s/p PPM, HTN, DM, hypothyroidism, and OSA. ?  ?Admitted 9/18 with SOB, acute on chronic systolic CHF. Diuresed with IV lasix (12 pounds), discharge weight was 215 pounds.  ?  ?Had R TKR 12/19.  ?  ?Admitted 11/22 with right knee septic arthritis. Arthrocentesis culture grew staph aureus.  Underwent I&D and polyexchange of infected right total knee arthroplasty.  Treated with 6 weeks IV antibiotics followed by suppressive abx. Aldactone held, losartan reduced to 25 mg daily and coreg reduced to 12.5 mg BID d/t soft BP. She was given 1 dose of furosemide on admission due to leg edema but this was held later due to AKI.  Torsemide was resumed at discharge. ? 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  Patient underwent explant right total knee arthroplasty with implantation of articulating antibiotic spacer by Dr. Marchwiany on 07/12/2021. Placed on IV abx. Was discharged to SNF. Now back at home x 3 weeks. During hospitalization was very anemic but now improved.  ? ?Here today for f/u. Does not know which meds she is on. Multiple complaints of pain and swelling. Wearing a brace. Mild DOE. + LE edema R > L  ? ?Echo 2/23 EF 40-45%  ? ? ?ECHO 01/2017- EF 45-50% Grade 2DD Left Atrium Severely dilated.  ?ECHO 12/2017 EF 45-50%, G2DD, LA mildly dilated, trileaflet moderately thick aortic valve ?Echo 12/21 EF 45-50% G2DD  ? ?ROS: All systems negative except as listed in HPI, PMH and Problem List. ? ?SH:  ?Social History  ? ?Socioeconomic History  ? Marital status: Widowed  ?  Spouse name: Not on file  ? Number of  children: 3  ? Years of education: Not on file  ? Highest education level: Some college, no degree  ?Occupational History  ? Occupation: retired  ?Tobacco Use  ? Smoking status: Former  ?  Packs/day: 1.00  ?  Years: 22.00  ?  Pack years: 22.00  ?  Types: Cigarettes  ?  Quit date: 1983  ?  Years since quitting: 40.3  ? Smokeless tobacco: Never  ?Vaping Use  ? Vaping Use: Never used  ?Substance and Sexual Activity  ? Alcohol use: Yes  ?  Comment: 05/22/2018 "glass of wine couple times/wk"  ? Drug use: Not Currently  ? Sexual activity: Not Currently  ?  Birth control/protection: None, Post-menopausal  ?Other Topics Concern  ? Not on file  ?Social History Narrative  ? Not on file  ? ?Social Determinants of Health  ? ?Financial Resource Strain: Not on file  ?Food Insecurity: Not on file  ?Transportation Needs: Not on file  ?Physical Activity: Not on file  ?Stress: Not on file  ?Social Connections: Not on file  ?Intimate Partner Violence: Not on file  ? ? ?FH:  ?Family History  ?Problem Relation Age of Onset  ? Multiple myeloma Mother   ? Heart disease Father   ? Other Sister   ? Heart disease Brother   ? Diabetes Sister   ? Other Sister   ? ? ?Past Medical History:  ?Diagnosis Date  ? Acute combined systolic and diastolic congestive heart failure (HCC) 02/05/2017  ?   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 05/20/2018  ? Acute kidney failure (HCC) 05/15/2018  ? Arthritis   ? "all over" (05/22/2018)  ? Blood dyscrasia   ? per pt-has small blood cells-appears as if anemic  ? Breast cancer, left breast (HCC) 1985  ? CHF (congestive heart failure) (HCC)   ? Complication of anesthesia   ? difficult to awaken from per pt  ? Coronary artery disease   ? Dyspnea   ? Essential hypertension   ? Heart disease   ? Heart murmur   ? History of gout   ? Hypothyroidism   ? Obesity (BMI 30-39.9) 11/11/2017  ? OSA (obstructive sleep apnea) 02/18/2017  ?  severe obstructive sleep apnea with an AHI of 75.6/h and mild central  sleep apnea with a CAI of 7.7/h.  Oxygen saturations dropped as low as 82%.   He is on CPAP at 9 cm H2O.  ? OSA on CPAP   ? Personal history of chemotherapy   ? Personal history of radiation therapy   ? Presence of permanent cardiac pacemaker   ? Primary localized osteoarthritis of right knee 05/13/2018  ? Refusal of blood transfusions as patient is Jehovah's Witness   ? Type II diabetes mellitus (HCC)   ? ? ?Current Outpatient Medications  ?Medication Sig Dispense Refill  ? acetaminophen (TYLENOL) 500 MG tablet Take 500-1,000 mg by mouth every 6 (six) hours as needed (for pain or headaches).    ? allopurinol (ZYLOPRIM) 100 MG tablet Take 100 mg by mouth daily.    ? apixaban (ELIQUIS) 5 MG TABS tablet Take 1 tablet (5 mg total) by mouth 2 (two) times daily. 60 tablet 11  ? atorvastatin (LIPITOR) 80 MG tablet Take 1 tablet (80 mg total) by mouth at bedtime. 90 tablet 3  ? Blood Glucose Monitoring Suppl (ONETOUCH VERIO) w/Device KIT Use to check blood sugar 1 kit 0  ? cefadroxil (DURICEF) 1 g tablet Take 1 tablet (1 g total) by mouth 2 (two) times daily. 180 tablet 0  ? clotrimazole-betamethasone (LOTRISONE) lotion Apply 1 application topically 2 (two) times daily as needed (itching).    ? Continuous Blood Gluc Sensor (DEXCOM G6 SENSOR) MISC 1 Device by Does not apply route as directed. 9 each 3  ? Continuous Blood Gluc Transmit (DEXCOM G6 TRANSMITTER) MISC 1 Device by Does not apply route as directed. 1 each 3  ? empagliflozin (JARDIANCE) 10 MG TABS tablet Take 10 mg by mouth daily.    ? Ferrous Sulfate (IRON) 325 (65 Fe) MG TABS Take 1 tablet (325 mg total) by mouth 2 (two) times daily before a meal.    ? gabapentin (NEURONTIN) 300 MG capsule Take 1 capsule (300 mg total) by mouth 3 (three) times daily.    ? Insulin Glargine (BASAGLAR KWIKPEN) 100 UNIT/ML Inject 40 Units into the skin at bedtime.    ? insulin lispro (HUMALOG) 100 UNIT/ML injection Inject 0.05 mLs (5 Units total) into the skin 3 (three) times daily  before meals. (Patient taking differently: Inject 5-15 Units into the skin 3 (three) times daily before meals. Per sliding scale) 10 mL 0  ? latanoprost (XALATAN) 0.005 % ophthalmic solution Place 1 drop into both eyes at bedtime.    ? levothyroxine (SYNTHROID, LEVOTHROID) 75 MCG tablet Take 75 mcg by mouth daily before breakfast.    ? LUMIGAN 0.01 % SOLN Place 1 drop into both eyes at bedtime.    ? Multiple Vitamins-Minerals (ALIVE ONCE DAILY WOMENS   50+ PO) Take 1 tablet by mouth daily.    ? Biotin 10 MG TABS Take 10 mg by mouth daily.    ? carvedilol (COREG) 3.125 MG tablet Take 1 tablet (3.125 mg total) by mouth 2 (two) times daily with a meal.    ? polyethylene glycol (MIRALAX / GLYCOLAX) 17 g packet Take 17 g by mouth daily. 14 each 0  ? senna (SENOKOT) 8.6 MG TABS tablet Take 1 tablet (8.6 mg total) by mouth 2 (two) times daily. For constipation.  0  ? torsemide (DEMADEX) 20 MG tablet Take 2 tablets (40 mg total) by mouth every other day.    ? Travoprost, BAK Free, (TRAVATAN) 0.004 % SOLN ophthalmic solution Place 1 drop into both eyes at bedtime.     ? ?No current facility-administered medications for this encounter.  ? ? ?Vitals:  ? 09/20/21 1049  ?BP: 114/62  ?Pulse: 78  ?SpO2: 97%  ?Weight: 93.6 kg (206 lb 6.4 oz)  ? ? ?Wt Readings from Last 3 Encounters:  ?09/20/21 93.6 kg  ?08/01/21 93.8 kg  ?07/03/21 95.7 kg  ? ? ?PHYSICAL EXAM: ?General:  Elderly No resp difficulty ?HEENT: normal ?Neck: supple. no JVD. Carotids 2+ bilat; no bruits. No lymphadenopathy or thryomegaly appreciated. ?Cor: PMI nondisplaced. Regular rate & rhythm. 2/6 SEM LUSB ?Lungs: clear ?Abdomen: soft, nontender, nondistended. No hepatosplenomegaly. No bruits or masses. Good bowel sounds. ?Extremities: no cyanosis, clubbing, rash + brace on R knee 1-2+ edema on R tr-1+ edema on left ?Neuro: alert & orientedx3, cranial nerves grossly intact. moves all 4 extremities w/o difficulty. Affect pleasant ? ? ?ASSESSMENT & PLAN: ?1. Chronic  systolic CHF: ICM ?- Echo 12/21 EF 45-50% G2DD  ?- Echo 2/23 EF 40-45%  ?- Stable NYHA II. Volume appears ok. Need compression hose ?- Continue Torsemide 40 mg daily.  ?- Off losartan 25 mg daily and spiro 12.5 due to

## 2021-09-20 NOTE — Progress Notes (Signed)
Paramedicine Encounter ? ? ? Patient ID: Danielle [name], female    DOB: 09-05-1945, 76 y.o.   MRN: [id number] ? ?Went out for home visit for a med review for Mrs. [name]. I reviewed all meds she had at home and called and reviewed with Kevan Rosebush RN at [facility]. I filled pill box for one week.  ? ?I reviewed upcoming appointments with Amayra.  ? ?I learned she does not have a transmitter for her Dexcom, I will be calling [place] [phone]) tomorrow to set up a shipment for that. Currently she does not have a way to check her sugar as she lost her previous manual meter. I advised her to be cautious of her diet and insulin dosages. She agreed with plan and is going to try to find her manual glucometer in the meantime waiting for her Dexcom shipment.  ? ?We reviewed heart failure management including diet, weighing daily, limiting salt and fluid intake.  ? ?I plan to see her in one week. Home visit complete.  ? ?Refills: ?Folic Acid ?Losartan  ? ? ?ACTION: ?Home visit completed ? ? ? ? ? ? ?

## 2021-09-20 NOTE — Addendum Note (Signed)
Encounter addended by: Scarlette Calico, RN on: 09/20/2021 11:21 AM ? Actions taken: Clinical Note Signed

## 2021-09-20 NOTE — Patient Instructions (Signed)
Please wear your compression hose daily, place them on as soon as you get up in the morning and remove before you go to bed at night. ? ?Ossie Yebra, community paramedic will come out and see you later today to help organize your medications ? ?Your physician recommends that you schedule a follow-up appointment in: 1-2 months ? ?Do the following things EVERYDAY: ?Weigh yourself in the morning before breakfast. Write it down and keep it in a log. ?Take your medicines as prescribed ?Eat low salt foods--Limit salt (sodium) to 2000 mg per day.  ?Stay as active as you can everyday ?Limit all fluids for the day to less than 2 liters ? ?If you have any questions or concerns before your next appointment please send [place] a message through [place] or call our office at [phone].   ? ?TO LEAVE A MESSAGE FOR THE NURSE SELECT OPTION 2, PLEASE LEAVE A MESSAGE INCLUDING: ?YOUR NAME ?DATE OF BIRTH ?CALL BACK NUMBER ?REASON FOR CALL**this is important as we prioritize the call backs ? ?YOU WILL RECEIVE A CALL BACK THE SAME DAY AS LONG AS YOU CALL BEFORE 4:00 PM ? ?At the [facility], you and your health needs are our priority. As part of our continuing mission to provide you with exceptional heart care, we have created designated Provider Care Teams. These Care Teams include your primary Cardiologist (physician) and Advanced Practice Providers (APPs- Physician Assistants and Nurse Practitioners) who all work together to provide you with the care you need, when you need it.  ? ?You may see any of the following providers on your designated Care Team at your next follow up: ?Dr Glori Bickers ?Dr Loralie Champagne ?Darrick Grinder, NP ?Lyda Jester, PA ?Jessica Milford,NP ?Marlyce Huge, PA ?Audry Riles, PharmD ? ? ?Please be sure to bring in all your medications bottles to every appointment.  ? ? ?

## 2021-09-20 NOTE — Telephone Encounter (Signed)
Danielle [name] is out seeing pt and called to report what medications has been taking, med list updated to include Losartan 25 mg daily, Torsemide 40 mg daily, and folic acid 1 mg daily. Advised to continue current medications for now, will send to Dr Haroldine Laws for review and let her know if we need to make any changes.  ?

## 2021-09-20 NOTE — Progress Notes (Signed)
Pt is unsure of what meds and doses she is taking since being d/c'd from SNF. Danielle [name], community paramedic will resume seeing pt and will see her later today. She will call our office to review meds at that time. ? ?Kevan Rosebush, RN, BSN, CHFN ?Specialty Coordinator ?[facility] ? ?

## 2021-09-20 NOTE — Progress Notes (Signed)
CSW consulted to re-enroll pt with paramedicine now that she is home from SNF and struggling with her medications.  CSW sent out referral to pt previous paramedic, [name], who will restart services ? ?Jorge Ny, LCSW ?Clinical Social Worker ?[facility] ?Desk#: [phone] ?Cell#: [phone] ? ?

## 2021-09-21 ENCOUNTER — Other Ambulatory Visit: Payer: Self-pay

## 2021-09-21 ENCOUNTER — Encounter: Payer: Self-pay | Admitting: Internal Medicine

## 2021-09-21 ENCOUNTER — Ambulatory Visit: Payer: Medicare HMO | Admitting: Internal Medicine

## 2021-09-21 ENCOUNTER — Telehealth (HOSPITAL_COMMUNITY): Payer: Self-pay

## 2021-09-21 VITALS — BP 173/83 | HR 86 | Temp 97.5°F | Ht 63.0 in | Wt 205.0 lb

## 2021-09-21 DIAGNOSIS — E039 Hypothyroidism, unspecified: Secondary | ICD-10-CM | POA: Diagnosis not present

## 2021-09-21 DIAGNOSIS — I255 Ischemic cardiomyopathy: Secondary | ICD-10-CM | POA: Diagnosis not present

## 2021-09-21 DIAGNOSIS — D631 Anemia in chronic kidney disease: Secondary | ICD-10-CM | POA: Diagnosis not present

## 2021-09-21 DIAGNOSIS — I2510 Atherosclerotic heart disease of native coronary artery without angina pectoris: Secondary | ICD-10-CM | POA: Diagnosis not present

## 2021-09-21 DIAGNOSIS — M10331 Gout due to renal impairment, right wrist: Secondary | ICD-10-CM | POA: Diagnosis not present

## 2021-09-21 DIAGNOSIS — E113513 Type 2 diabetes mellitus with proliferative diabetic retinopathy with macular edema, bilateral: Secondary | ICD-10-CM | POA: Diagnosis not present

## 2021-09-21 DIAGNOSIS — R001 Bradycardia, unspecified: Secondary | ICD-10-CM | POA: Diagnosis not present

## 2021-09-21 DIAGNOSIS — I480 Paroxysmal atrial fibrillation: Secondary | ICD-10-CM | POA: Diagnosis not present

## 2021-09-21 DIAGNOSIS — D509 Iron deficiency anemia, unspecified: Secondary | ICD-10-CM | POA: Diagnosis not present

## 2021-09-21 DIAGNOSIS — T8453XD Infection and inflammatory reaction due to internal right knee prosthesis, subsequent encounter: Secondary | ICD-10-CM | POA: Diagnosis not present

## 2021-09-21 DIAGNOSIS — T8450XD Infection and inflammatory reaction due to unspecified internal joint prosthesis, subsequent encounter: Secondary | ICD-10-CM | POA: Diagnosis not present

## 2021-09-21 DIAGNOSIS — B9561 Methicillin susceptible Staphylococcus aureus infection as the cause of diseases classified elsewhere: Secondary | ICD-10-CM | POA: Diagnosis not present

## 2021-09-21 DIAGNOSIS — N1831 Chronic kidney disease, stage 3a: Secondary | ICD-10-CM | POA: Diagnosis not present

## 2021-09-21 DIAGNOSIS — G4733 Obstructive sleep apnea (adult) (pediatric): Secondary | ICD-10-CM | POA: Diagnosis not present

## 2021-09-21 DIAGNOSIS — Z6833 Body mass index (BMI) 33.0-33.9, adult: Secondary | ICD-10-CM | POA: Diagnosis not present

## 2021-09-21 DIAGNOSIS — E785 Hyperlipidemia, unspecified: Secondary | ICD-10-CM | POA: Diagnosis not present

## 2021-09-21 DIAGNOSIS — E1165 Type 2 diabetes mellitus with hyperglycemia: Secondary | ICD-10-CM | POA: Diagnosis not present

## 2021-09-21 DIAGNOSIS — I130 Hypertensive heart and chronic kidney disease with heart failure and stage 1 through stage 4 chronic kidney disease, or unspecified chronic kidney disease: Secondary | ICD-10-CM | POA: Diagnosis not present

## 2021-09-21 DIAGNOSIS — I82622 Acute embolism and thrombosis of deep veins of left upper extremity: Secondary | ICD-10-CM | POA: Diagnosis not present

## 2021-09-21 DIAGNOSIS — E1122 Type 2 diabetes mellitus with diabetic chronic kidney disease: Secondary | ICD-10-CM | POA: Diagnosis not present

## 2021-09-21 DIAGNOSIS — E1142 Type 2 diabetes mellitus with diabetic polyneuropathy: Secondary | ICD-10-CM | POA: Diagnosis not present

## 2021-09-21 DIAGNOSIS — I5042 Chronic combined systolic (congestive) and diastolic (congestive) heart failure: Secondary | ICD-10-CM | POA: Diagnosis not present

## 2021-09-21 DIAGNOSIS — D62 Acute posthemorrhagic anemia: Secondary | ICD-10-CM | POA: Diagnosis not present

## 2021-09-21 DIAGNOSIS — M00061 Staphylococcal arthritis, right knee: Secondary | ICD-10-CM | POA: Diagnosis not present

## 2021-09-21 DIAGNOSIS — I890 Lymphedema, not elsewhere classified: Secondary | ICD-10-CM | POA: Diagnosis not present

## 2021-09-21 NOTE — Telephone Encounter (Signed)
Spoke to [place] who I made aware her Dexcom cannot be mailed out until she pays off a balance of $104.04 with [employer]. I have provided her with the number to contact them to settle the bill. She reports she is going to call her insurance as she believes they are supposed to be covering this.  ? ?I advised her to look again for her glucometer and if she can't find it to call [facility] to see if they can fill another one or reach out to Dr. Nathaneil Canary office for assistance as this is her endocrinologist. She agreed with plan. I will reach out on Monday to follow up.  ? ?[employer]- [phone] ?

## 2021-09-21 NOTE — Patient Instructions (Addendum)
Let's check your inflammation level ? ? ?Please call our office 2 weeks before cefadroxil runs out so we can renew it for you ? ? ?Call us and your surgeon on when you want to start getting a new knee ? ? ? ?

## 2021-09-21 NOTE — Progress Notes (Signed)
?  ? ? ? ? ?[facility] for Infectious Disease ? ?Patient Active Problem List  ? Diagnosis Date Noted  ? DVT (deep venous thrombosis) ([place]) 07/31/2021  ? Acute blood loss anemia   ? Septic Arthritis, Infection of prosthetic right knee joint ([place]), MSSA infection 07/10/2021  ? Complication of internal right knee prosthesis ([place]) 07/10/2021  ? CAD (coronary artery disease) 06/08/2021  ? Carotid artery disease ([place]) 06/08/2021  ? Symptomatic bradycardia 06/08/2021  ? Paroxysmal atrial fibrillation ([place]) 06/08/2021  ? CKD (chronic kidney disease), stage III ([place]) 06/08/2021  ? Polyneuropathy associated with underlying disease ([place]) 05/04/2020  ? Severe nonproliferative diabetic retinopathy of left eye, with macular edema, associated with type 2 diabetes mellitus ([place]) 11/05/2019  ? Severe nonproliferative diabetic retinopathy of right eye, with macular edema, associated with type 2 diabetes mellitus ([place]) 11/05/2019  ? Retinal hemorrhage of right eye 11/05/2019  ? Retinal hemorrhage of left eye 11/05/2019  ? Diabetes mellitus ([place]) 05/14/2019  ? 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 Type 2 diabetes mellitus with diabetic polyneuropathy, with long-term current use of insulin ([place]) 05/14/2019  ? History of gout 05/20/2018  ? Primary localized osteoarthritis of right knee 05/13/2018  ? 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 Obesity (BMI 30-39.9) 11/11/2017  ? OSA (obstructive sleep apnea) 02/18/2017  ? Essential hypertension   ? L Breast Cancer   ? Hypothyroidism   ? ? ? ? ?Subjective:  ? ? Patient ID: Danielle [name], female    DOB: 08-Nov-1945, 76 y.o.   MRN: [id number] ? ?Cc: f/u hospital admission for MSSA PJI ? ?HPI: ? ?Danielle [name] is a 76 y.o. female obese, hfrEF, faib on eliquis, ckd3, here for f/u recurrent right knee mssa pji ? ?I reviewed notes from recent hospital admissions in 06/2021 ?She was evaluated by my  partner dr Juleen China ?"S/p DAIR 03/2021 with or cx mssa; s/p 6 weeks cefazolin; on suppressive cephalexin. Recurrent infection in setting of being out of abx for 1 week. S/p explant with abx spacer 07/12/2021 -- or cx again with mssa." ?Afib on eliquis -- rifampin not used previously due to ddi ? ?She was supposed to finish iv abx on 08/23/21 ? ?She missed a follow up clinic appointment recently. ? ?She just left the nursing home today ([facility]) ? ?Ortho is dr Zachery Dakins ? ?------ ?08/31/21 id clinic visit ?She was in snf previously and our office was unable to get labs ?Our staff is trying to get the labs again as of now ?She has occasional severe pain  ?No f/c ?No n/v/diarrhea ?No wound break down/redness/swelling ?She is yet to make f/u ortho clinic appointment ? ?4/27 id clinic f/u ?He saw her orthopedic surgeon last week -- who wants to place in a new knee ?She has mild pain of the right knee; her left knee prosthetic joint is without any pain/swelling ?Previous snf labs -- 4/3 cbc 8/9.5/280; cr 1.34; no crp or lft ?We checked crp 4/6 at the id clinic and it was 5.4 (<10) ?We started her on cefadroxil last visit for chronic suppression ? ? ? ?Allergies  ?Allergen Reactions  ? Other Other (See Comments)  ?  Blood Product Refusal (Jehovah's witness) ? ?  ? Tape Other (See Comments)  ?  Leaves blisters and marks on the skin  ? Sulfa Antibiotics Rash  ? Uloric [Febuxostat] Rash  ? ? ? ? ?Outpatient Medications Prior to  Visit  ?Medication Sig Dispense Refill  ? acetaminophen (TYLENOL) 500 MG tablet Take 500-1,000 mg by mouth every 6 (six) hours as needed (for pain or headaches).    ? allopurinol (ZYLOPRIM) 100 MG tablet Take 100 mg by mouth daily.    ? apixaban (ELIQUIS) 5 MG TABS tablet Take 1 tablet (5 mg total) by mouth 2 (two) times daily. 60 tablet 11  ? atorvastatin (LIPITOR) 80 MG tablet Take 1 tablet (80 mg total) by mouth at bedtime. 90 tablet 3  ? Biotin 10 MG TABS Take 10 mg by mouth daily.    ?  Blood Glucose Monitoring Suppl (ONETOUCH VERIO) w/Device KIT Use to check blood sugar 1 kit 0  ? carvedilol (COREG) 3.125 MG tablet Take 1 tablet (3.125 mg total) by mouth 2 (two) times daily with a meal.    ? cefadroxil (DURICEF) 1 g tablet Take 1 tablet (1 g total) by mouth 2 (two) times daily. 180 tablet 0  ? clotrimazole-betamethasone (LOTRISONE) lotion Apply 1 application topically 2 (two) times daily as needed (itching).    ? Continuous Blood Gluc Sensor (DEXCOM G6 SENSOR) MISC 1 Device by Does not apply route as directed. 9 each 3  ? Continuous Blood Gluc Transmit (DEXCOM G6 TRANSMITTER) MISC 1 Device by Does not apply route as directed. 1 each 3  ? empagliflozin (JARDIANCE) 10 MG TABS tablet Take 10 mg by mouth daily.    ? Ferrous Sulfate (IRON) 325 (65 Fe) MG TABS Take 1 tablet (325 mg total) by mouth 2 (two) times daily before a meal.    ? gabapentin (NEURONTIN) 300 MG capsule Take 1 capsule (300 mg total) by mouth 3 (three) times daily.    ? insulin lispro (HUMALOG) 100 UNIT/ML injection Inject 0.05 mLs (5 Units total) into the skin 3 (three) times daily before meals. (Patient taking differently: Inject 5-15 Units into the skin 3 (three) times daily before meals. Per sliding scale) 10 mL 0  ? latanoprost (XALATAN) 0.005 % ophthalmic solution Place 1 drop into both eyes at bedtime.    ? levothyroxine (SYNTHROID, LEVOTHROID) 75 MCG tablet Take 75 mcg by mouth daily before breakfast.    ? LUMIGAN 0.01 % SOLN Place 1 drop into both eyes at bedtime.    ? polyethylene glycol (MIRALAX / GLYCOLAX) 17 g packet Take 17 g by mouth daily. 14 each 0  ? senna (SENOKOT) 8.6 MG TABS tablet Take 1 tablet (8.6 mg total) by mouth 2 (two) times daily. For constipation.  0  ? torsemide (DEMADEX) 20 MG tablet Take 40 mg by mouth daily.    ? Travoprost, BAK Free, (TRAVATAN) 0.004 % SOLN ophthalmic solution Place 1 drop into both eyes at bedtime.     ? folic acid (FOLVITE) 1 MG tablet Take 1 mg by mouth daily.    ? Insulin  Glargine (BASAGLAR KWIKPEN) 100 UNIT/ML Inject 40 Units into the skin at bedtime.    ? losartan (COZAAR) 25 MG tablet Take 25 mg by mouth daily.    ? Multiple Vitamins-Minerals (ALIVE ONCE DAILY WOMENS 50+ PO) Take 1 tablet by mouth daily.    ? ?No facility-administered medications prior to visit.  ? ? ? ?Social History  ? ?Socioeconomic History  ? Marital status: Widowed  ?  Spouse name: Not on file  ? Number of children: 3  ? Years of education: Not on file  ? Highest education level: Some college, no degree  ?Occupational History  ? Occupation: retired  ?Tobacco  Use  ? Smoking status: Former  ?  Packs/day: 1.00  ?  Years: 22.00  ?  Pack years: 22.00  ?  Types: Cigarettes  ?  Quit date: 29  ?  Years since quitting: 40.3  ? Smokeless tobacco: Never  ?Vaping Use  ? Vaping Use: Never used  ?Substance and Sexual Activity  ? Alcohol use: Yes  ?  Comment: 05/22/2018 "glass of wine couple times/wk"  ? Drug use: Not Currently  ? Sexual activity: Not Currently  ?  Birth control/protection: None, Post-menopausal  ?Other Topics Concern  ? Not on file  ?Social History Narrative  ? Not on file  ? ?Social Determinants of Health  ? ?Financial Resource Strain: Not on file  ?Food Insecurity: Not on file  ?Transportation Needs: Not on file  ?Physical Activity: Not on file  ?Stress: Not on file  ?Social Connections: Not on file  ?Intimate Partner Violence: Not on file  ? ? ? ? ?Review of Systems ?   ?All other ros negative ? ?Objective:  ?  ?BP (!) 173/83   Pulse 86   Temp (!) 97.5 ?F (36.4 ?C) (Rectal)   Ht 5' 3"  (1.6 m)   Wt 205 lb (93 kg)   SpO2 97%   BMI 36.31 kg/m?  ?Nursing note and vital signs reviewed. ? ?Physical Exam ? ?   ?General/constitutional: no distress, pleasant ?HEENT: Normocephalic, PER, Conj Clear, EOMI, Oropharynx clear ?Neck supple ?CV: rrr no mrg ?Lungs: clear to auscultation, normal respiratory effort ?Abd: Soft, Nontender ?Ext: no edema ?Skin: No Rash ?Neuro: nonfocal ? ?MSK: right knee no incision  dehiscence (all healed); slight warmth; no effusion/tenderness ? ? ?Labs: ?Lab Results  ?Component Value Date  ? WBC 10.1 07/17/2021  ? HGB 8.5 (L) 08/01/2021  ? HCT 29.6 (L) 08/01/2021  ? MCV 74.7 (L) 07/17/2021  ? P

## 2021-09-22 DIAGNOSIS — T8453XD Infection and inflammatory reaction due to internal right knee prosthesis, subsequent encounter: Secondary | ICD-10-CM | POA: Diagnosis not present

## 2021-09-22 DIAGNOSIS — E785 Hyperlipidemia, unspecified: Secondary | ICD-10-CM | POA: Diagnosis not present

## 2021-09-22 DIAGNOSIS — E039 Hypothyroidism, unspecified: Secondary | ICD-10-CM | POA: Diagnosis not present

## 2021-09-22 DIAGNOSIS — I5042 Chronic combined systolic (congestive) and diastolic (congestive) heart failure: Secondary | ICD-10-CM | POA: Diagnosis not present

## 2021-09-22 DIAGNOSIS — N1831 Chronic kidney disease, stage 3a: Secondary | ICD-10-CM | POA: Diagnosis not present

## 2021-09-22 DIAGNOSIS — R001 Bradycardia, unspecified: Secondary | ICD-10-CM | POA: Diagnosis not present

## 2021-09-22 DIAGNOSIS — I130 Hypertensive heart and chronic kidney disease with heart failure and stage 1 through stage 4 chronic kidney disease, or unspecified chronic kidney disease: Secondary | ICD-10-CM | POA: Diagnosis not present

## 2021-09-22 DIAGNOSIS — I10 Essential (primary) hypertension: Secondary | ICD-10-CM | POA: Diagnosis not present

## 2021-09-22 DIAGNOSIS — B9561 Methicillin susceptible Staphylococcus aureus infection as the cause of diseases classified elsewhere: Secondary | ICD-10-CM | POA: Diagnosis not present

## 2021-09-22 DIAGNOSIS — E1142 Type 2 diabetes mellitus with diabetic polyneuropathy: Secondary | ICD-10-CM | POA: Diagnosis not present

## 2021-09-22 DIAGNOSIS — Z6833 Body mass index (BMI) 33.0-33.9, adult: Secondary | ICD-10-CM | POA: Diagnosis not present

## 2021-09-22 DIAGNOSIS — I890 Lymphedema, not elsewhere classified: Secondary | ICD-10-CM | POA: Diagnosis not present

## 2021-09-22 DIAGNOSIS — I2510 Atherosclerotic heart disease of native coronary artery without angina pectoris: Secondary | ICD-10-CM | POA: Diagnosis not present

## 2021-09-22 DIAGNOSIS — I82622 Acute embolism and thrombosis of deep veins of left upper extremity: Secondary | ICD-10-CM | POA: Diagnosis not present

## 2021-09-22 DIAGNOSIS — I255 Ischemic cardiomyopathy: Secondary | ICD-10-CM | POA: Diagnosis not present

## 2021-09-22 DIAGNOSIS — D631 Anemia in chronic kidney disease: Secondary | ICD-10-CM | POA: Diagnosis not present

## 2021-09-22 DIAGNOSIS — I5022 Chronic systolic (congestive) heart failure: Secondary | ICD-10-CM | POA: Diagnosis not present

## 2021-09-22 DIAGNOSIS — M00061 Staphylococcal arthritis, right knee: Secondary | ICD-10-CM | POA: Diagnosis not present

## 2021-09-22 DIAGNOSIS — I480 Paroxysmal atrial fibrillation: Secondary | ICD-10-CM | POA: Diagnosis not present

## 2021-09-22 DIAGNOSIS — M10331 Gout due to renal impairment, right wrist: Secondary | ICD-10-CM | POA: Diagnosis not present

## 2021-09-22 DIAGNOSIS — D62 Acute posthemorrhagic anemia: Secondary | ICD-10-CM | POA: Diagnosis not present

## 2021-09-22 DIAGNOSIS — E1165 Type 2 diabetes mellitus with hyperglycemia: Secondary | ICD-10-CM | POA: Diagnosis not present

## 2021-09-22 DIAGNOSIS — E7800 Pure hypercholesterolemia, unspecified: Secondary | ICD-10-CM | POA: Diagnosis not present

## 2021-09-22 DIAGNOSIS — D509 Iron deficiency anemia, unspecified: Secondary | ICD-10-CM | POA: Diagnosis not present

## 2021-09-22 DIAGNOSIS — E1140 Type 2 diabetes mellitus with diabetic neuropathy, unspecified: Secondary | ICD-10-CM | POA: Diagnosis not present

## 2021-09-22 DIAGNOSIS — E1122 Type 2 diabetes mellitus with diabetic chronic kidney disease: Secondary | ICD-10-CM | POA: Diagnosis not present

## 2021-09-22 DIAGNOSIS — E113513 Type 2 diabetes mellitus with proliferative diabetic retinopathy with macular edema, bilateral: Secondary | ICD-10-CM | POA: Diagnosis not present

## 2021-09-22 DIAGNOSIS — G4733 Obstructive sleep apnea (adult) (pediatric): Secondary | ICD-10-CM | POA: Diagnosis not present

## 2021-09-22 LAB — COMPLETE METABOLIC PANEL WITH GFR
AG Ratio: 1.4 (calc) (ref 1.0–2.5)
ALT: 13 U/L (ref 6–29)
AST: 19 U/L (ref 10–35)
Albumin: 4.3 g/dL (ref 3.6–5.1)
Alkaline phosphatase (APISO): 99 U/L (ref 37–153)
BUN/Creatinine Ratio: 18 (calc) (ref 6–22)
BUN: 19 mg/dL (ref 7–25)
CO2: 30 mmol/L (ref 20–32)
Calcium: 9.6 mg/dL (ref 8.6–10.4)
Chloride: 102 mmol/L (ref 98–110)
Creat: 1.04 mg/dL — ABNORMAL HIGH (ref 0.60–1.00)
Globulin: 3 g/dL (calc) (ref 1.9–3.7)
Glucose, Bld: 217 mg/dL — ABNORMAL HIGH (ref 65–99)
Potassium: 4.2 mmol/L (ref 3.5–5.3)
Sodium: 141 mmol/L (ref 135–146)
Total Bilirubin: 0.6 mg/dL (ref 0.2–1.2)
Total Protein: 7.3 g/dL (ref 6.1–8.1)
eGFR: 56 mL/min/{1.73_m2} — ABNORMAL LOW (ref >=60)

## 2021-09-22 LAB — C-REACTIVE PROTEIN: CRP: 2 mg/L (ref <8.0)

## 2021-09-25 DIAGNOSIS — I130 Hypertensive heart and chronic kidney disease with heart failure and stage 1 through stage 4 chronic kidney disease, or unspecified chronic kidney disease: Secondary | ICD-10-CM | POA: Diagnosis not present

## 2021-09-25 DIAGNOSIS — D62 Acute posthemorrhagic anemia: Secondary | ICD-10-CM | POA: Diagnosis not present

## 2021-09-25 DIAGNOSIS — N1831 Chronic kidney disease, stage 3a: Secondary | ICD-10-CM | POA: Diagnosis not present

## 2021-09-25 DIAGNOSIS — I5042 Chronic combined systolic (congestive) and diastolic (congestive) heart failure: Secondary | ICD-10-CM | POA: Diagnosis not present

## 2021-09-25 DIAGNOSIS — I890 Lymphedema, not elsewhere classified: Secondary | ICD-10-CM | POA: Diagnosis not present

## 2021-09-25 DIAGNOSIS — D631 Anemia in chronic kidney disease: Secondary | ICD-10-CM | POA: Diagnosis not present

## 2021-09-25 DIAGNOSIS — B9561 Methicillin susceptible Staphylococcus aureus infection as the cause of diseases classified elsewhere: Secondary | ICD-10-CM | POA: Diagnosis not present

## 2021-09-25 DIAGNOSIS — E039 Hypothyroidism, unspecified: Secondary | ICD-10-CM | POA: Diagnosis not present

## 2021-09-25 DIAGNOSIS — I255 Ischemic cardiomyopathy: Secondary | ICD-10-CM | POA: Diagnosis not present

## 2021-09-25 DIAGNOSIS — I480 Paroxysmal atrial fibrillation: Secondary | ICD-10-CM | POA: Diagnosis not present

## 2021-09-25 DIAGNOSIS — Z6833 Body mass index (BMI) 33.0-33.9, adult: Secondary | ICD-10-CM | POA: Diagnosis not present

## 2021-09-25 DIAGNOSIS — E1122 Type 2 diabetes mellitus with diabetic chronic kidney disease: Secondary | ICD-10-CM | POA: Diagnosis not present

## 2021-09-25 DIAGNOSIS — E113513 Type 2 diabetes mellitus with proliferative diabetic retinopathy with macular edema, bilateral: Secondary | ICD-10-CM | POA: Diagnosis not present

## 2021-09-25 DIAGNOSIS — M00061 Staphylococcal arthritis, right knee: Secondary | ICD-10-CM | POA: Diagnosis not present

## 2021-09-25 DIAGNOSIS — E1142 Type 2 diabetes mellitus with diabetic polyneuropathy: Secondary | ICD-10-CM | POA: Diagnosis not present

## 2021-09-25 DIAGNOSIS — I2510 Atherosclerotic heart disease of native coronary artery without angina pectoris: Secondary | ICD-10-CM | POA: Diagnosis not present

## 2021-09-25 DIAGNOSIS — E785 Hyperlipidemia, unspecified: Secondary | ICD-10-CM | POA: Diagnosis not present

## 2021-09-25 DIAGNOSIS — M10331 Gout due to renal impairment, right wrist: Secondary | ICD-10-CM | POA: Diagnosis not present

## 2021-09-25 DIAGNOSIS — I82622 Acute embolism and thrombosis of deep veins of left upper extremity: Secondary | ICD-10-CM | POA: Diagnosis not present

## 2021-09-25 DIAGNOSIS — G4733 Obstructive sleep apnea (adult) (pediatric): Secondary | ICD-10-CM | POA: Diagnosis not present

## 2021-09-25 DIAGNOSIS — T8453XD Infection and inflammatory reaction due to internal right knee prosthesis, subsequent encounter: Secondary | ICD-10-CM | POA: Diagnosis not present

## 2021-09-25 DIAGNOSIS — D509 Iron deficiency anemia, unspecified: Secondary | ICD-10-CM | POA: Diagnosis not present

## 2021-09-25 DIAGNOSIS — E1165 Type 2 diabetes mellitus with hyperglycemia: Secondary | ICD-10-CM | POA: Diagnosis not present

## 2021-09-25 DIAGNOSIS — R001 Bradycardia, unspecified: Secondary | ICD-10-CM | POA: Diagnosis not present

## 2021-09-26 DIAGNOSIS — T8453XD Infection and inflammatory reaction due to internal right knee prosthesis, subsequent encounter: Secondary | ICD-10-CM | POA: Diagnosis not present

## 2021-09-26 DIAGNOSIS — E1142 Type 2 diabetes mellitus with diabetic polyneuropathy: Secondary | ICD-10-CM | POA: Diagnosis not present

## 2021-09-26 DIAGNOSIS — B9561 Methicillin susceptible Staphylococcus aureus infection as the cause of diseases classified elsewhere: Secondary | ICD-10-CM | POA: Diagnosis not present

## 2021-09-26 DIAGNOSIS — N1831 Chronic kidney disease, stage 3a: Secondary | ICD-10-CM | POA: Diagnosis not present

## 2021-09-26 DIAGNOSIS — D509 Iron deficiency anemia, unspecified: Secondary | ICD-10-CM | POA: Diagnosis not present

## 2021-09-26 DIAGNOSIS — G4733 Obstructive sleep apnea (adult) (pediatric): Secondary | ICD-10-CM | POA: Diagnosis not present

## 2021-09-26 DIAGNOSIS — E785 Hyperlipidemia, unspecified: Secondary | ICD-10-CM | POA: Diagnosis not present

## 2021-09-26 DIAGNOSIS — I82622 Acute embolism and thrombosis of deep veins of left upper extremity: Secondary | ICD-10-CM | POA: Diagnosis not present

## 2021-09-26 DIAGNOSIS — I2510 Atherosclerotic heart disease of native coronary artery without angina pectoris: Secondary | ICD-10-CM | POA: Diagnosis not present

## 2021-09-26 DIAGNOSIS — M00061 Staphylococcal arthritis, right knee: Secondary | ICD-10-CM | POA: Diagnosis not present

## 2021-09-26 DIAGNOSIS — Z6833 Body mass index (BMI) 33.0-33.9, adult: Secondary | ICD-10-CM | POA: Diagnosis not present

## 2021-09-26 DIAGNOSIS — D631 Anemia in chronic kidney disease: Secondary | ICD-10-CM | POA: Diagnosis not present

## 2021-09-26 DIAGNOSIS — R001 Bradycardia, unspecified: Secondary | ICD-10-CM | POA: Diagnosis not present

## 2021-09-26 DIAGNOSIS — E113513 Type 2 diabetes mellitus with proliferative diabetic retinopathy with macular edema, bilateral: Secondary | ICD-10-CM | POA: Diagnosis not present

## 2021-09-26 DIAGNOSIS — E1122 Type 2 diabetes mellitus with diabetic chronic kidney disease: Secondary | ICD-10-CM | POA: Diagnosis not present

## 2021-09-26 DIAGNOSIS — D62 Acute posthemorrhagic anemia: Secondary | ICD-10-CM | POA: Diagnosis not present

## 2021-09-26 DIAGNOSIS — E039 Hypothyroidism, unspecified: Secondary | ICD-10-CM | POA: Diagnosis not present

## 2021-09-26 DIAGNOSIS — M10331 Gout due to renal impairment, right wrist: Secondary | ICD-10-CM | POA: Diagnosis not present

## 2021-09-26 DIAGNOSIS — I255 Ischemic cardiomyopathy: Secondary | ICD-10-CM | POA: Diagnosis not present

## 2021-09-26 DIAGNOSIS — E1165 Type 2 diabetes mellitus with hyperglycemia: Secondary | ICD-10-CM | POA: Diagnosis not present

## 2021-09-26 DIAGNOSIS — I890 Lymphedema, not elsewhere classified: Secondary | ICD-10-CM | POA: Diagnosis not present

## 2021-09-26 DIAGNOSIS — I130 Hypertensive heart and chronic kidney disease with heart failure and stage 1 through stage 4 chronic kidney disease, or unspecified chronic kidney disease: Secondary | ICD-10-CM | POA: Diagnosis not present

## 2021-09-26 DIAGNOSIS — I480 Paroxysmal atrial fibrillation: Secondary | ICD-10-CM | POA: Diagnosis not present

## 2021-09-26 DIAGNOSIS — I5042 Chronic combined systolic (congestive) and diastolic (congestive) heart failure: Secondary | ICD-10-CM | POA: Diagnosis not present

## 2021-09-27 ENCOUNTER — Other Ambulatory Visit (HOSPITAL_COMMUNITY): Payer: Self-pay

## 2021-09-27 ENCOUNTER — Other Ambulatory Visit (HOSPITAL_COMMUNITY): Payer: Self-pay | Admitting: *Deleted

## 2021-09-27 DIAGNOSIS — I130 Hypertensive heart and chronic kidney disease with heart failure and stage 1 through stage 4 chronic kidney disease, or unspecified chronic kidney disease: Secondary | ICD-10-CM | POA: Diagnosis not present

## 2021-09-27 DIAGNOSIS — D631 Anemia in chronic kidney disease: Secondary | ICD-10-CM | POA: Diagnosis not present

## 2021-09-27 DIAGNOSIS — E1122 Type 2 diabetes mellitus with diabetic chronic kidney disease: Secondary | ICD-10-CM | POA: Diagnosis not present

## 2021-09-27 DIAGNOSIS — E113513 Type 2 diabetes mellitus with proliferative diabetic retinopathy with macular edema, bilateral: Secondary | ICD-10-CM | POA: Diagnosis not present

## 2021-09-27 DIAGNOSIS — I480 Paroxysmal atrial fibrillation: Secondary | ICD-10-CM | POA: Diagnosis not present

## 2021-09-27 DIAGNOSIS — D62 Acute posthemorrhagic anemia: Secondary | ICD-10-CM | POA: Diagnosis not present

## 2021-09-27 DIAGNOSIS — I2510 Atherosclerotic heart disease of native coronary artery without angina pectoris: Secondary | ICD-10-CM | POA: Diagnosis not present

## 2021-09-27 DIAGNOSIS — M10331 Gout due to renal impairment, right wrist: Secondary | ICD-10-CM | POA: Diagnosis not present

## 2021-09-27 DIAGNOSIS — M00061 Staphylococcal arthritis, right knee: Secondary | ICD-10-CM | POA: Diagnosis not present

## 2021-09-27 DIAGNOSIS — E039 Hypothyroidism, unspecified: Secondary | ICD-10-CM | POA: Diagnosis not present

## 2021-09-27 DIAGNOSIS — D509 Iron deficiency anemia, unspecified: Secondary | ICD-10-CM | POA: Diagnosis not present

## 2021-09-27 DIAGNOSIS — I82622 Acute embolism and thrombosis of deep veins of left upper extremity: Secondary | ICD-10-CM | POA: Diagnosis not present

## 2021-09-27 DIAGNOSIS — I890 Lymphedema, not elsewhere classified: Secondary | ICD-10-CM | POA: Diagnosis not present

## 2021-09-27 DIAGNOSIS — G4733 Obstructive sleep apnea (adult) (pediatric): Secondary | ICD-10-CM | POA: Diagnosis not present

## 2021-09-27 DIAGNOSIS — N1831 Chronic kidney disease, stage 3a: Secondary | ICD-10-CM | POA: Diagnosis not present

## 2021-09-27 DIAGNOSIS — T8453XD Infection and inflammatory reaction due to internal right knee prosthesis, subsequent encounter: Secondary | ICD-10-CM | POA: Diagnosis not present

## 2021-09-27 DIAGNOSIS — I5042 Chronic combined systolic (congestive) and diastolic (congestive) heart failure: Secondary | ICD-10-CM | POA: Diagnosis not present

## 2021-09-27 DIAGNOSIS — I255 Ischemic cardiomyopathy: Secondary | ICD-10-CM | POA: Diagnosis not present

## 2021-09-27 DIAGNOSIS — R001 Bradycardia, unspecified: Secondary | ICD-10-CM | POA: Diagnosis not present

## 2021-09-27 DIAGNOSIS — E1142 Type 2 diabetes mellitus with diabetic polyneuropathy: Secondary | ICD-10-CM | POA: Diagnosis not present

## 2021-09-27 DIAGNOSIS — E1165 Type 2 diabetes mellitus with hyperglycemia: Secondary | ICD-10-CM | POA: Diagnosis not present

## 2021-09-27 DIAGNOSIS — Z6833 Body mass index (BMI) 33.0-33.9, adult: Secondary | ICD-10-CM | POA: Diagnosis not present

## 2021-09-27 DIAGNOSIS — B9561 Methicillin susceptible Staphylococcus aureus infection as the cause of diseases classified elsewhere: Secondary | ICD-10-CM | POA: Diagnosis not present

## 2021-09-27 DIAGNOSIS — E785 Hyperlipidemia, unspecified: Secondary | ICD-10-CM | POA: Diagnosis not present

## 2021-09-27 MED ORDER — LOSARTAN POTASSIUM 25 MG PO TABS: 25.0000 mg | ORAL_TABLET | Freq: Every day | ORAL | 6 refills | Status: DC

## 2021-09-27 MED ORDER — CARVEDILOL 3.125 MG PO TABS: 3.1250 mg | ORAL_TABLET | Freq: Two times a day (BID) | ORAL | 6 refills | Status: DC

## 2021-09-27 NOTE — Progress Notes (Signed)
Paramedicine Encounter ? ? ? Patient ID: Danielle [name], female    DOB: 1945-11-06, 76 y.o.   MRN: [id number] ? ?Arrived for home visit for [facility] who reports feeling good. Home PT with [facility] was finishing up her visit with [facility] as I arrived.  ? ?Danielle [name] denied any chest pain, shortness of breath, dizziness. She does have a small quarter sized burn on the top of her left hand that appears to be healing. She reports she has been cleaning it and keeping it dry.  ? ?Assessment and vitals as noted. She is wearing compression hose to reduce swelling. Some mild edema noted today. Lungs clear.  ? ?I reviewed medications and confirmed same. Pill box filled for one week.  ? ?Refills: ?Carvedilol ?Losartan ?Folic Acid ? ?CBG- 131 (using at home meter to check glucose)  ? ?HF clinic appointment scheduled for 5/30 same time as his infectious disease appointment I will get same rescheduled.  ? ? ? ? ?Patient Care Team: ?Seward Carol, MD as PCP - General (Internal Medicine) ?Constance Haw, MD as PCP - Electrophysiology (Cardiology) ?Sueanne Margarita, MD as PCP - Sleep Medicine (Cardiology) ?Bensimhon, Shaune Pascal, MD as PCP - Cardiology (Cardiology) ?Jorge Ny, LCSW as Education officer, museum (Licensed Holiday representative) ?Shamleffer, Melanie Crazier, MD as Consulting Physician (Endocrinology) ? ?Patient Active Problem List  ? Diagnosis Date Noted  ? DVT (deep venous thrombosis) ([place]) 07/31/2021  ? Acute blood loss anemia   ? Septic Arthritis, Infection of prosthetic right knee joint ([place]), MSSA infection 07/10/2021  ? Complication of internal right knee prosthesis ([place]) 07/10/2021  ? CAD (coronary artery disease) 06/08/2021  ? Carotid artery disease ([place]) 06/08/2021  ? Symptomatic bradycardia 06/08/2021  ? Paroxysmal atrial fibrillation ([place]) 06/08/2021  ? CKD (chronic kidney disease), stage III ([place]) 06/08/2021  ? Polyneuropathy associated with underlying disease ([place]) 05/04/2020  ? Severe nonproliferative diabetic  retinopathy of left eye, with macular edema, associated with type 2 diabetes mellitus ([place]) 11/05/2019  ? Severe nonproliferative diabetic retinopathy of right eye, with macular edema, associated with type 2 diabetes mellitus ([place]) 11/05/2019  ? Retinal hemorrhage of right eye 11/05/2019  ? Retinal hemorrhage of left eye 11/05/2019  ? Diabetes mellitus ([place]) 05/14/2019  ? 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 Type 2 diabetes mellitus with diabetic polyneuropathy, with long-term current use of insulin ([place]) 05/14/2019  ? History of gout 05/20/2018  ? Primary localized osteoarthritis of right knee 05/13/2018  ? 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 Obesity (BMI 30-39.9) 11/11/2017  ? OSA (obstructive sleep apnea) 02/18/2017  ? Essential hypertension   ? L Breast Cancer   ? Hypothyroidism   ? ? ?Current Outpatient Medications:  ?  acetaminophen (TYLENOL) 500 MG tablet, Take 500-1,000 mg by mouth every 6 (six) hours as needed (for pain or headaches)., Disp: , Rfl:  ?  allopurinol (ZYLOPRIM) 100 MG tablet, Take 100 mg by mouth daily., Disp: , Rfl:  ?  apixaban (ELIQUIS) 5 MG TABS tablet, Take 1 tablet (5 mg total) by mouth 2 (two) times daily., Disp: 60 tablet, Rfl: 11 ?  atorvastatin (LIPITOR) 80 MG tablet, Take 1 tablet (80 mg total) by mouth at bedtime., Disp: 90 tablet, Rfl: 3 ?  Biotin 10 MG TABS, Take 10 mg by mouth daily., Disp: , Rfl:  ?  Blood Glucose Monitoring Suppl (ONETOUCH VERIO) w/Device KIT, Use to check blood sugar, Disp: 1 kit,  Rfl: 0 ?  carvedilol (COREG) 3.125 MG tablet, Take 1 tablet (3.125 mg total) by mouth 2 (two) times daily with a meal., Disp: , Rfl:  ?  cefadroxil (DURICEF) 1 g tablet, Take 1 tablet (1 g total) by mouth 2 (two) times daily., Disp: 180 tablet, Rfl: 0 ?  clotrimazole-betamethasone (LOTRISONE) lotion, Apply 1 application topically 2 (two) times daily as needed (itching)., Disp: ,  Rfl:  ?  Continuous Blood Gluc Sensor (DEXCOM G6 SENSOR) MISC, 1 Device by Does not apply route as directed., Disp: 9 each, Rfl: 3 ?  Continuous Blood Gluc Transmit (DEXCOM G6 TRANSMITTER) MISC, 1 Device by Does not apply route as directed., Disp: 1 each, Rfl: 3 ?  empagliflozin (JARDIANCE) 10 MG TABS tablet, Take 10 mg by mouth daily., Disp: , Rfl:  ?  Ferrous Sulfate (IRON) 325 (65 Fe) MG TABS, Take 1 tablet (325 mg total) by mouth 2 (two) times daily before a meal., Disp: , Rfl:  ?  folic acid (FOLVITE) 1 MG tablet, Take 1 mg by mouth daily., Disp: , Rfl:  ?  gabapentin (NEURONTIN) 300 MG capsule, Take 1 capsule (300 mg total) by mouth 3 (three) times daily., Disp: , Rfl:  ?  Insulin Glargine (BASAGLAR KWIKPEN) 100 UNIT/ML, Inject 40 Units into the skin at bedtime., Disp: , Rfl:  ?  insulin lispro (HUMALOG) 100 UNIT/ML injection, Inject 0.05 mLs (5 Units total) into the skin 3 (three) times daily before meals. (Patient taking differently: Inject 5-15 Units into the skin 3 (three) times daily before meals. Per sliding scale), Disp: 10 mL, Rfl: 0 ?  latanoprost (XALATAN) 0.005 % ophthalmic solution, Place 1 drop into both eyes at bedtime., Disp: , Rfl:  ?  levothyroxine (SYNTHROID, LEVOTHROID) 75 MCG tablet, Take 75 mcg by mouth daily before breakfast., Disp: , Rfl:  ?  losartan (COZAAR) 25 MG tablet, Take 25 mg by mouth daily., Disp: , Rfl:  ?  LUMIGAN 0.01 % SOLN, Place 1 drop into both eyes at bedtime., Disp: , Rfl:  ?  Multiple Vitamins-Minerals (ALIVE ONCE DAILY WOMENS 50+ PO), Take 1 tablet by mouth daily., Disp: , Rfl:  ?  polyethylene glycol (MIRALAX / GLYCOLAX) 17 g packet, Take 17 g by mouth daily., Disp: 14 each, Rfl: 0 ?  senna (SENOKOT) 8.6 MG TABS tablet, Take 1 tablet (8.6 mg total) by mouth 2 (two) times daily. For constipation., Disp: , Rfl: 0 ?  torsemide (DEMADEX) 20 MG tablet, Take 40 mg by mouth daily., Disp: , Rfl:  ?  Travoprost, BAK Free, (TRAVATAN) 0.004 % SOLN ophthalmic solution, Place 1  drop into both eyes at bedtime. , Disp: , Rfl:  ?Allergies  ?Allergen Reactions  ? Other Other (See Comments)  ?  Blood Product Refusal (Jehovah's witness) ? ?  ? Tape Other (See Comments)  ?  Leaves blisters and marks on the skin  ? Sulfa Antibiotics Rash  ? Uloric [Febuxostat] Rash  ? ? ? ?Social History  ? ?Socioeconomic History  ? Marital status: Widowed  ?  Spouse name: Not on file  ? Number of children: 3  ? Years of education: Not on file  ? Highest education level: Some college, no degree  ?Occupational History  ? Occupation: retired  ?Tobacco Use  ? Smoking status: Former  ?  Packs/day: 1.00  ?  Years: 22.00  ?  Pack years: 22.00  ?  Types: Cigarettes  ?  Quit date: 16  ?  Years since quitting: 40.3  ?  Smokeless tobacco: Never  ?Vaping Use  ? Vaping Use: Never used  ?Substance and Sexual Activity  ? Alcohol use: Yes  ?  Comment: 05/22/2018 "glass of wine couple times/wk"  ? Drug use: Not Currently  ? Sexual activity: Not Currently  ?  Birth control/protection: None, Post-menopausal  ?Other Topics Concern  ? Not on file  ?Social History Narrative  ? Not on file  ? ?Social Determinants of Health  ? ?Financial Resource Strain: Not on file  ?Food Insecurity: Not on file  ?Transportation Needs: Not on file  ?Physical Activity: Not on file  ?Stress: Not on file  ?Social Connections: Not on file  ?Intimate Partner Violence: Not on file  ? ? ?Physical Exam ? ? ? ? ? ?Future Appointments  ?Date Time Provider [place]  ?10/04/2021  9:15 AM Rankin, Clent Demark, MD RDE-R[facility] None  ?10/24/2021 10:45 AM Vu, Rockey Situ, MD [facility]  ?10/24/2021 11:00 AM [facility] PA/NP [facility] None  ?12/14/2021 11:00 AM [facility] DEVICE REMOTES [facility]  ?03/15/2022 11:00 AM [facility] DEVICE REMOTES [facility]  ?06/14/2022 11:00 AM [facility] DEVICE REMOTES [facility]  ? ? ? ?ACTION: ?Home visit completed ? ? ? ? ? ? ?

## 2021-09-28 DIAGNOSIS — I130 Hypertensive heart and chronic kidney disease with heart failure and stage 1 through stage 4 chronic kidney disease, or unspecified chronic kidney disease: Secondary | ICD-10-CM | POA: Diagnosis not present

## 2021-09-28 DIAGNOSIS — M10331 Gout due to renal impairment, right wrist: Secondary | ICD-10-CM | POA: Diagnosis not present

## 2021-09-28 DIAGNOSIS — B9561 Methicillin susceptible Staphylococcus aureus infection as the cause of diseases classified elsewhere: Secondary | ICD-10-CM | POA: Diagnosis not present

## 2021-09-28 DIAGNOSIS — R001 Bradycardia, unspecified: Secondary | ICD-10-CM | POA: Diagnosis not present

## 2021-09-28 DIAGNOSIS — E1142 Type 2 diabetes mellitus with diabetic polyneuropathy: Secondary | ICD-10-CM | POA: Diagnosis not present

## 2021-09-28 DIAGNOSIS — I255 Ischemic cardiomyopathy: Secondary | ICD-10-CM | POA: Diagnosis not present

## 2021-09-28 DIAGNOSIS — I82622 Acute embolism and thrombosis of deep veins of left upper extremity: Secondary | ICD-10-CM | POA: Diagnosis not present

## 2021-09-28 DIAGNOSIS — E1122 Type 2 diabetes mellitus with diabetic chronic kidney disease: Secondary | ICD-10-CM | POA: Diagnosis not present

## 2021-09-28 DIAGNOSIS — I480 Paroxysmal atrial fibrillation: Secondary | ICD-10-CM | POA: Diagnosis not present

## 2021-09-28 DIAGNOSIS — D509 Iron deficiency anemia, unspecified: Secondary | ICD-10-CM | POA: Diagnosis not present

## 2021-09-28 DIAGNOSIS — D62 Acute posthemorrhagic anemia: Secondary | ICD-10-CM | POA: Diagnosis not present

## 2021-09-28 DIAGNOSIS — E785 Hyperlipidemia, unspecified: Secondary | ICD-10-CM | POA: Diagnosis not present

## 2021-09-28 DIAGNOSIS — I890 Lymphedema, not elsewhere classified: Secondary | ICD-10-CM | POA: Diagnosis not present

## 2021-09-28 DIAGNOSIS — I5042 Chronic combined systolic (congestive) and diastolic (congestive) heart failure: Secondary | ICD-10-CM | POA: Diagnosis not present

## 2021-09-28 DIAGNOSIS — N1831 Chronic kidney disease, stage 3a: Secondary | ICD-10-CM | POA: Diagnosis not present

## 2021-09-28 DIAGNOSIS — E039 Hypothyroidism, unspecified: Secondary | ICD-10-CM | POA: Diagnosis not present

## 2021-09-28 DIAGNOSIS — I2510 Atherosclerotic heart disease of native coronary artery without angina pectoris: Secondary | ICD-10-CM | POA: Diagnosis not present

## 2021-09-28 DIAGNOSIS — D631 Anemia in chronic kidney disease: Secondary | ICD-10-CM | POA: Diagnosis not present

## 2021-09-28 DIAGNOSIS — E113513 Type 2 diabetes mellitus with proliferative diabetic retinopathy with macular edema, bilateral: Secondary | ICD-10-CM | POA: Diagnosis not present

## 2021-09-28 DIAGNOSIS — Z6833 Body mass index (BMI) 33.0-33.9, adult: Secondary | ICD-10-CM | POA: Diagnosis not present

## 2021-09-28 DIAGNOSIS — T8453XD Infection and inflammatory reaction due to internal right knee prosthesis, subsequent encounter: Secondary | ICD-10-CM | POA: Diagnosis not present

## 2021-09-28 DIAGNOSIS — E1165 Type 2 diabetes mellitus with hyperglycemia: Secondary | ICD-10-CM | POA: Diagnosis not present

## 2021-09-28 DIAGNOSIS — M00061 Staphylococcal arthritis, right knee: Secondary | ICD-10-CM | POA: Diagnosis not present

## 2021-09-28 DIAGNOSIS — G4733 Obstructive sleep apnea (adult) (pediatric): Secondary | ICD-10-CM | POA: Diagnosis not present

## 2021-09-29 ENCOUNTER — Telehealth: Payer: Self-pay | Admitting: Hematology and Oncology

## 2021-09-29 NOTE — Telephone Encounter (Signed)
Scheduled appt per 5/3 referral. Pt is aware of appt date and time. Pt is aware to arrive 15 mins prior to appt time and to bring and updated insurance card. Pt is aware of appt location.   ?

## 2021-10-02 DIAGNOSIS — I890 Lymphedema, not elsewhere classified: Secondary | ICD-10-CM | POA: Diagnosis not present

## 2021-10-02 DIAGNOSIS — E1142 Type 2 diabetes mellitus with diabetic polyneuropathy: Secondary | ICD-10-CM | POA: Diagnosis not present

## 2021-10-02 DIAGNOSIS — G4733 Obstructive sleep apnea (adult) (pediatric): Secondary | ICD-10-CM | POA: Diagnosis not present

## 2021-10-02 DIAGNOSIS — E039 Hypothyroidism, unspecified: Secondary | ICD-10-CM | POA: Diagnosis not present

## 2021-10-02 DIAGNOSIS — E113513 Type 2 diabetes mellitus with proliferative diabetic retinopathy with macular edema, bilateral: Secondary | ICD-10-CM | POA: Diagnosis not present

## 2021-10-02 DIAGNOSIS — I480 Paroxysmal atrial fibrillation: Secondary | ICD-10-CM | POA: Diagnosis not present

## 2021-10-02 DIAGNOSIS — B9561 Methicillin susceptible Staphylococcus aureus infection as the cause of diseases classified elsewhere: Secondary | ICD-10-CM | POA: Diagnosis not present

## 2021-10-02 DIAGNOSIS — M00061 Staphylococcal arthritis, right knee: Secondary | ICD-10-CM | POA: Diagnosis not present

## 2021-10-02 DIAGNOSIS — N1831 Chronic kidney disease, stage 3a: Secondary | ICD-10-CM | POA: Diagnosis not present

## 2021-10-02 DIAGNOSIS — I82622 Acute embolism and thrombosis of deep veins of left upper extremity: Secondary | ICD-10-CM | POA: Diagnosis not present

## 2021-10-02 DIAGNOSIS — E1165 Type 2 diabetes mellitus with hyperglycemia: Secondary | ICD-10-CM | POA: Diagnosis not present

## 2021-10-02 DIAGNOSIS — I5042 Chronic combined systolic (congestive) and diastolic (congestive) heart failure: Secondary | ICD-10-CM | POA: Diagnosis not present

## 2021-10-02 DIAGNOSIS — Z6833 Body mass index (BMI) 33.0-33.9, adult: Secondary | ICD-10-CM | POA: Diagnosis not present

## 2021-10-02 DIAGNOSIS — I2510 Atherosclerotic heart disease of native coronary artery without angina pectoris: Secondary | ICD-10-CM | POA: Diagnosis not present

## 2021-10-02 DIAGNOSIS — I255 Ischemic cardiomyopathy: Secondary | ICD-10-CM | POA: Diagnosis not present

## 2021-10-02 DIAGNOSIS — T8453XD Infection and inflammatory reaction due to internal right knee prosthesis, subsequent encounter: Secondary | ICD-10-CM | POA: Diagnosis not present

## 2021-10-02 DIAGNOSIS — R001 Bradycardia, unspecified: Secondary | ICD-10-CM | POA: Diagnosis not present

## 2021-10-02 DIAGNOSIS — D631 Anemia in chronic kidney disease: Secondary | ICD-10-CM | POA: Diagnosis not present

## 2021-10-02 DIAGNOSIS — E1122 Type 2 diabetes mellitus with diabetic chronic kidney disease: Secondary | ICD-10-CM | POA: Diagnosis not present

## 2021-10-02 DIAGNOSIS — D509 Iron deficiency anemia, unspecified: Secondary | ICD-10-CM | POA: Diagnosis not present

## 2021-10-02 DIAGNOSIS — M10331 Gout due to renal impairment, right wrist: Secondary | ICD-10-CM | POA: Diagnosis not present

## 2021-10-02 DIAGNOSIS — E785 Hyperlipidemia, unspecified: Secondary | ICD-10-CM | POA: Diagnosis not present

## 2021-10-02 DIAGNOSIS — D62 Acute posthemorrhagic anemia: Secondary | ICD-10-CM | POA: Diagnosis not present

## 2021-10-02 DIAGNOSIS — I130 Hypertensive heart and chronic kidney disease with heart failure and stage 1 through stage 4 chronic kidney disease, or unspecified chronic kidney disease: Secondary | ICD-10-CM | POA: Diagnosis not present

## 2021-10-03 DIAGNOSIS — D62 Acute posthemorrhagic anemia: Secondary | ICD-10-CM | POA: Diagnosis not present

## 2021-10-03 DIAGNOSIS — I82622 Acute embolism and thrombosis of deep veins of left upper extremity: Secondary | ICD-10-CM | POA: Diagnosis not present

## 2021-10-03 DIAGNOSIS — E1122 Type 2 diabetes mellitus with diabetic chronic kidney disease: Secondary | ICD-10-CM | POA: Diagnosis not present

## 2021-10-03 DIAGNOSIS — E1165 Type 2 diabetes mellitus with hyperglycemia: Secondary | ICD-10-CM | POA: Diagnosis not present

## 2021-10-03 DIAGNOSIS — M10331 Gout due to renal impairment, right wrist: Secondary | ICD-10-CM | POA: Diagnosis not present

## 2021-10-03 DIAGNOSIS — M00061 Staphylococcal arthritis, right knee: Secondary | ICD-10-CM | POA: Diagnosis not present

## 2021-10-03 DIAGNOSIS — D509 Iron deficiency anemia, unspecified: Secondary | ICD-10-CM | POA: Diagnosis not present

## 2021-10-03 DIAGNOSIS — I130 Hypertensive heart and chronic kidney disease with heart failure and stage 1 through stage 4 chronic kidney disease, or unspecified chronic kidney disease: Secondary | ICD-10-CM | POA: Diagnosis not present

## 2021-10-03 DIAGNOSIS — B9561 Methicillin susceptible Staphylococcus aureus infection as the cause of diseases classified elsewhere: Secondary | ICD-10-CM | POA: Diagnosis not present

## 2021-10-03 DIAGNOSIS — N1831 Chronic kidney disease, stage 3a: Secondary | ICD-10-CM | POA: Diagnosis not present

## 2021-10-03 DIAGNOSIS — G4733 Obstructive sleep apnea (adult) (pediatric): Secondary | ICD-10-CM | POA: Diagnosis not present

## 2021-10-03 DIAGNOSIS — I480 Paroxysmal atrial fibrillation: Secondary | ICD-10-CM | POA: Diagnosis not present

## 2021-10-03 DIAGNOSIS — I5042 Chronic combined systolic (congestive) and diastolic (congestive) heart failure: Secondary | ICD-10-CM | POA: Diagnosis not present

## 2021-10-03 DIAGNOSIS — I890 Lymphedema, not elsewhere classified: Secondary | ICD-10-CM | POA: Diagnosis not present

## 2021-10-03 DIAGNOSIS — D631 Anemia in chronic kidney disease: Secondary | ICD-10-CM | POA: Diagnosis not present

## 2021-10-03 DIAGNOSIS — E1142 Type 2 diabetes mellitus with diabetic polyneuropathy: Secondary | ICD-10-CM | POA: Diagnosis not present

## 2021-10-03 DIAGNOSIS — E113513 Type 2 diabetes mellitus with proliferative diabetic retinopathy with macular edema, bilateral: Secondary | ICD-10-CM | POA: Diagnosis not present

## 2021-10-03 DIAGNOSIS — E785 Hyperlipidemia, unspecified: Secondary | ICD-10-CM | POA: Diagnosis not present

## 2021-10-03 DIAGNOSIS — T8453XD Infection and inflammatory reaction due to internal right knee prosthesis, subsequent encounter: Secondary | ICD-10-CM | POA: Diagnosis not present

## 2021-10-03 DIAGNOSIS — I2510 Atherosclerotic heart disease of native coronary artery without angina pectoris: Secondary | ICD-10-CM | POA: Diagnosis not present

## 2021-10-03 DIAGNOSIS — E039 Hypothyroidism, unspecified: Secondary | ICD-10-CM | POA: Diagnosis not present

## 2021-10-03 DIAGNOSIS — Z6833 Body mass index (BMI) 33.0-33.9, adult: Secondary | ICD-10-CM | POA: Diagnosis not present

## 2021-10-03 DIAGNOSIS — I255 Ischemic cardiomyopathy: Secondary | ICD-10-CM | POA: Diagnosis not present

## 2021-10-03 DIAGNOSIS — R001 Bradycardia, unspecified: Secondary | ICD-10-CM | POA: Diagnosis not present

## 2021-10-04 ENCOUNTER — Encounter (INDEPENDENT_AMBULATORY_CARE_PROVIDER_SITE_OTHER): Payer: Medicare HMO | Admitting: Ophthalmology

## 2021-10-04 ENCOUNTER — Ambulatory Visit (INDEPENDENT_AMBULATORY_CARE_PROVIDER_SITE_OTHER): Payer: Medicare HMO | Admitting: Ophthalmology

## 2021-10-04 ENCOUNTER — Encounter (INDEPENDENT_AMBULATORY_CARE_PROVIDER_SITE_OTHER): Payer: Self-pay | Admitting: Ophthalmology

## 2021-10-04 ENCOUNTER — Other Ambulatory Visit (HOSPITAL_COMMUNITY): Payer: Self-pay

## 2021-10-04 DIAGNOSIS — E113412 Type 2 diabetes mellitus with severe nonproliferative diabetic retinopathy with macular edema, left eye: Secondary | ICD-10-CM

## 2021-10-04 DIAGNOSIS — G4733 Obstructive sleep apnea (adult) (pediatric): Secondary | ICD-10-CM | POA: Diagnosis not present

## 2021-10-04 MED ORDER — BEVACIZUMAB 2.5 MG/0.1ML IZ SOSY
2.5000 mg | PREFILLED_SYRINGE | INTRAVITREAL | Status: AC | PRN
  Administered 2021-10-04: 2.5 mg via INTRAVITREAL

## 2021-10-04 NOTE — Assessment & Plan Note (Signed)
Noncompliant currently with CPAP use, encouraged to resume so as to maximize healing processes systemically but also to improve macular oxygenation to the retina at night and assist in clearance of the diabetic macular edema left eye, in the experience with Dr. Zadie Rhine ?

## 2021-10-04 NOTE — Progress Notes (Signed)
Paramedicine Encounter ? ? ? Patient ID: Danielle [name], female    DOB: 10/05/1945, 76 y.o.   MRN: [id number] ? ?Arrived for home visit for [facility] who reports feeling good. She had a procedure done today in her left eye at the [facility] but reports it went well. She has been compliant with her meds except for one evening dose.  ? ?I reviewed meds and confirmed same filling pill box for one week. I called in the following refills to [facility]- ?-Torsemide ?-Carvedilol ?-Folic Acid (needs refills)  ? ?We reviewed upcoming appointments and confirmed same. She has been eating low sodium and low carb diet over the last week and reports feeling better with good blood sugar readings. She states today she had some yogurt and fruit before our visit and her CBG is 217.  ? ?Assessment and vitals obtained. Some mild lower leg swelling but improved from last week. Lungs clear. No complaints of chest pain, dizziness or shortness of breath.  ? ?We discussed continuing to manage her diet as well as getting better about using her CPAP machine at night. She agreed with plans. I will see Kerry-Anne in one week. Home Visit complete.  ? ?She needs 5/30 appointment rescheduled I will work on this for her.  ? ? ?Patient Care Team: ?Seward Carol, MD as PCP - General (Internal Medicine) ?Constance Haw, MD as PCP - Electrophysiology (Cardiology) ?Sueanne Margarita, MD as PCP - Sleep Medicine (Cardiology) ?Bensimhon, Shaune Pascal, MD as PCP - Cardiology (Cardiology) ?Jorge Ny, LCSW as Education officer, museum (Licensed Holiday representative) ?Shamleffer, Melanie Crazier, MD as Consulting Physician (Endocrinology) ? ?Patient Active Problem List  ? Diagnosis Date Noted  ? DVT (deep venous thrombosis) ([place]) 07/31/2021  ? Acute blood loss anemia   ? Septic Arthritis, Infection of prosthetic right knee joint ([place]), MSSA infection 07/10/2021  ? Complication of internal right knee prosthesis ([place]) 07/10/2021  ? CAD (coronary artery disease)  06/08/2021  ? Carotid artery disease ([place]) 06/08/2021  ? Symptomatic bradycardia 06/08/2021  ? Paroxysmal atrial fibrillation ([place]) 06/08/2021  ? CKD (chronic kidney disease), stage III ([place]) 06/08/2021  ? Polyneuropathy associated with underlying disease ([place]) 05/04/2020  ? Severe nonproliferative diabetic retinopathy of left eye, with macular edema, associated with type 2 diabetes mellitus ([place]) 11/05/2019  ? Severe nonproliferative diabetic retinopathy of right eye, with macular edema, associated with type 2 diabetes mellitus ([place]) 11/05/2019  ? Retinal hemorrhage of right eye 11/05/2019  ? Retinal hemorrhage of left eye 11/05/2019  ? Diabetes mellitus ([place]) 05/14/2019  ? 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 Type 2 diabetes mellitus with diabetic polyneuropathy, with long-term current use of insulin ([place]) 05/14/2019  ? History of gout 05/20/2018  ? Primary localized osteoarthritis of right knee 05/13/2018  ? 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 Obesity (BMI 30-39.9) 11/11/2017  ? OSA (obstructive sleep apnea) 02/18/2017  ? Essential hypertension   ? L Breast Cancer   ? Hypothyroidism   ? ? ?Current Outpatient Medications:  ?  acetaminophen (TYLENOL) 500 MG tablet, Take 500-1,000 mg by mouth every 6 (six) hours as needed (for pain or headaches)., Disp: , Rfl:  ?  allopurinol (ZYLOPRIM) 100 MG tablet, Take 100 mg by mouth daily., Disp: , Rfl:  ?  apixaban (ELIQUIS) 5 MG TABS tablet, Take 1 tablet (5 mg total) by mouth 2 (two) times daily., Disp: 60 tablet, Rfl: 11 ?  atorvastatin (LIPITOR) 80 MG tablet, Take 1 tablet (80 mg total) by mouth at bedtime., Disp: 90 tablet, Rfl: 3 ?  Biotin 10 MG TABS, Take 10 mg by mouth daily., Disp: , Rfl:  ?  Blood Glucose Monitoring Suppl (ONETOUCH VERIO) w/Device KIT, Use to check blood sugar, Disp: 1 kit, Rfl: 0 ?  carvedilol (COREG) 3.125 MG tablet, Take 1 tablet (3.125 mg total)  by mouth 2 (two) times daily with a meal., Disp: 60 tablet, Rfl: 6 ?  cefadroxil (DURICEF) 1 g tablet, Take 1 tablet (1 g total) by mouth 2 (two) times daily., Disp: 180 tablet, Rfl: 0 ?  clotrimazole-betamethasone (LOTRISONE) lotion, Apply 1 application topically 2 (two) times daily as needed (itching)., Disp: , Rfl:  ?  Continuous Blood Gluc Sensor (DEXCOM G6 SENSOR) MISC, 1 Device by Does not apply route as directed. (Patient not taking: Reported on 09/27/2021), Disp: 9 each, Rfl: 3 ?  Continuous Blood Gluc Transmit (DEXCOM G6 TRANSMITTER) MISC, 1 Device by Does not apply route as directed. (Patient not taking: Reported on 09/27/2021), Disp: 1 each, Rfl: 3 ?  empagliflozin (JARDIANCE) 10 MG TABS tablet, Take 10 mg by mouth daily., Disp: , Rfl:  ?  Ferrous Sulfate (IRON) 325 (65 Fe) MG TABS, Take 1 tablet (325 mg total) by mouth 2 (two) times daily before a meal., Disp: , Rfl:  ?  folic acid (FOLVITE) 1 MG tablet, Take 1 mg by mouth daily., Disp: , Rfl:  ?  gabapentin (NEURONTIN) 300 MG capsule, Take 1 capsule (300 mg total) by mouth 3 (three) times daily., Disp: , Rfl:  ?  Insulin Glargine (BASAGLAR KWIKPEN) 100 UNIT/ML, Inject 40 Units into the skin at bedtime., Disp: , Rfl:  ?  insulin lispro (HUMALOG) 100 UNIT/ML injection, Inject 0.05 mLs (5 Units total) into the skin 3 (three) times daily before meals. (Patient taking differently: Inject 5-15 Units into the skin 3 (three) times daily before meals. Per sliding scale), Disp: 10 mL, Rfl: 0 ?  latanoprost (XALATAN) 0.005 % ophthalmic solution, Place 1 drop into both eyes at bedtime., Disp: , Rfl:  ?  levothyroxine (SYNTHROID, LEVOTHROID) 75 MCG tablet, Take 75 mcg by mouth daily before breakfast., Disp: , Rfl:  ?  losartan (COZAAR) 25 MG tablet, Take 1 tablet (25 mg total) by mouth daily., Disp: 30 tablet, Rfl: 6 ?  LUMIGAN 0.01 % SOLN, Place 1 drop into both eyes at bedtime., Disp: , Rfl:  ?  Multiple Vitamins-Minerals (ALIVE ONCE DAILY WOMENS 50+ PO), Take 1  tablet by mouth daily., Disp: , Rfl:  ?  polyethylene glycol (MIRALAX / GLYCOLAX) 17 g packet, Take 17 g by mouth daily., Disp: 14 each, Rfl: 0 ?  senna (SENOKOT) 8.6 MG TABS tablet, Take 1 tablet (8.6 mg total) by mouth 2 (two) times daily. For constipation., Disp: , Rfl: 0 ?  torsemide (DEMADEX) 20 MG tablet, Take 40 mg by mouth daily., Disp: , Rfl:  ?  Travoprost, BAK Free, (TRAVATAN) 0.004 % SOLN ophthalmic solution, Place 1 drop into both eyes at bedtime. , Disp: , Rfl:  ?Allergies  ?Allergen Reactions  ? Other Other (See Comments)  ?  Blood Product Refusal (Jehovah's witness) ? ?  ? Tape Other (See Comments)  ?  Leaves blisters and marks on the skin  ? Sulfa Antibiotics Rash  ? Uloric [Febuxostat] Rash  ? ? ? ?Social History  ? ?Socioeconomic History  ? Marital status: Widowed  ?  Spouse name: Not on file  ?  Number of children: 3  ? Years of education: Not on file  ? Highest education level: Some college, no degree  ?Occupational History  ? Occupation: retired  ?Tobacco Use  ? Smoking status: Former  ?  Packs/day: 1.00  ?  Years: 22.00  ?  Pack years: 22.00  ?  Types: Cigarettes  ?  Quit date: 60  ?  Years since quitting: 40.3  ? Smokeless tobacco: Never  ?Vaping Use  ? Vaping Use: Never used  ?Substance and Sexual Activity  ? Alcohol use: Yes  ?  Comment: 05/22/2018 "glass of wine couple times/wk"  ? Drug use: Not Currently  ? Sexual activity: Not Currently  ?  Birth control/protection: None, Post-menopausal  ?Other Topics Concern  ? Not on file  ?Social History Narrative  ? Not on file  ? ?Social Determinants of Health  ? ?Financial Resource Strain: Not on file  ?Food Insecurity: Not on file  ?Transportation Needs: Not on file  ?Physical Activity: Not on file  ?Stress: Not on file  ?Social Connections: Not on file  ?Intimate Partner Violence: Not on file  ? ? ?Physical Exam ? ? ? ? ? ?Future Appointments  ?Date Time Provider [place]  ?10/13/2021 10:00 AM Benay Pike, MD [facility] None   ?10/13/2021 10:45 AM [facility] LAB [facility] None  ?10/24/2021 10:45 AM Vu, Rockey Situ, MD [facility]  ?10/24/2021 11:00 AM [facility] PA/NP [facility] None  ?11/15/2021  9:15 AM Rankin, Clent Demark, MD [facility] None  ?

## 2021-10-04 NOTE — Progress Notes (Signed)
? ? ?10/04/2021 ? ?  ? ?CHIEF COMPLAINT ?Patient presents for  ?Chief Complaint  ?Patient presents with  ? Diabetic Retinopathy with Macular Edema  ? ? ? ? ?HISTORY OF PRESENT ILLNESS: ?Danielle [name] is a 76 y.o. female who presents to the clinic today for:  ? ?HPI   ?6 mos fu  ?Pt states vision has been stable. ?"Im looking at you right now and I get two of you. Then I have to blink blink blink to get you into focus. Say I drop something, it looks like the object blends into the floor." ?Pt is seeing floaters. ?Last edited by Silvestre Moment on 10/04/2021  9:19 AM.  ?  ? ? ?Referring physician: ?Seward Carol, MD ?[address] ?[address] ?[place],  [place] [address] ? ?HISTORICAL INFORMATION:  ? ?Selected notes from the [facility] ?  ? ?Lab Results  ?Component Value Date  ? HGBA1C 8.2 (H) 07/10/2021  ?  ? ?CURRENT MEDICATIONS: ?Current Outpatient Medications (Ophthalmic Drugs)  ?Medication Sig  ? latanoprost (XALATAN) 0.005 % ophthalmic solution Place 1 drop into both eyes at bedtime.  ? LUMIGAN 0.01 % SOLN Place 1 drop into both eyes at bedtime.  ? Travoprost, BAK Free, (TRAVATAN) 0.004 % SOLN ophthalmic solution Place 1 drop into both eyes at bedtime.   ? ?No current facility-administered medications for this visit. (Ophthalmic Drugs)  ? ?Current Outpatient Medications (Other)  ?Medication Sig  ? acetaminophen (TYLENOL) 500 MG tablet Take 500-1,000 mg by mouth every 6 (six) hours as needed (for pain or headaches).  ? allopurinol (ZYLOPRIM) 100 MG tablet Take 100 mg by mouth daily.  ? apixaban (ELIQUIS) 5 MG TABS tablet Take 1 tablet (5 mg total) by mouth 2 (two) times daily.  ? atorvastatin (LIPITOR) 80 MG tablet Take 1 tablet (80 mg total) by mouth at bedtime.  ? Biotin 10 MG TABS Take 10 mg by mouth daily.  ? Blood Glucose Monitoring Suppl (ONETOUCH VERIO) w/Device KIT Use to check blood sugar  ? carvedilol (COREG) 3.125 MG tablet Take 1 tablet (3.125 mg total) by mouth 2 (two) times daily with a meal.  ?  cefadroxil (DURICEF) 1 g tablet Take 1 tablet (1 g total) by mouth 2 (two) times daily.  ? clotrimazole-betamethasone (LOTRISONE) lotion Apply 1 application topically 2 (two) times daily as needed (itching).  ? Continuous Blood Gluc Sensor (DEXCOM G6 SENSOR) MISC 1 Device by Does not apply route as directed. (Patient not taking: Reported on 09/27/2021)  ? Continuous Blood Gluc Transmit (DEXCOM G6 TRANSMITTER) MISC 1 Device by Does not apply route as directed. (Patient not taking: Reported on 09/27/2021)  ? empagliflozin (JARDIANCE) 10 MG TABS tablet Take 10 mg by mouth daily.  ? Ferrous Sulfate (IRON) 325 (65 Fe) MG TABS Take 1 tablet (325 mg total) by mouth 2 (two) times daily before a meal.  ? folic acid (FOLVITE) 1 MG tablet Take 1 mg by mouth daily.  ? gabapentin (NEURONTIN) 300 MG capsule Take 1 capsule (300 mg total) by mouth 3 (three) times daily.  ? Insulin Glargine (BASAGLAR KWIKPEN) 100 UNIT/ML Inject 40 Units into the skin at bedtime.  ? insulin lispro (HUMALOG) 100 UNIT/ML injection Inject 0.05 mLs (5 Units total) into the skin 3 (three) times daily before meals. (Patient taking differently: Inject 5-15 Units into the skin 3 (three) times daily before meals. Per sliding scale)  ? levothyroxine (SYNTHROID, LEVOTHROID) 75 MCG tablet Take 75 mcg by mouth daily before breakfast.  ?  losartan (COZAAR) 25 MG tablet Take 1 tablet (25 mg total) by mouth daily.  ? Multiple Vitamins-Minerals (ALIVE ONCE DAILY WOMENS 50+ PO) Take 1 tablet by mouth daily.  ? polyethylene glycol (MIRALAX / GLYCOLAX) 17 g packet Take 17 g by mouth daily.  ? senna (SENOKOT) 8.6 MG TABS tablet Take 1 tablet (8.6 mg total) by mouth 2 (two) times daily. For constipation.  ? torsemide (DEMADEX) 20 MG tablet Take 40 mg by mouth daily.  ? ?No current facility-administered medications for this visit. (Other)  ? ? ? ? ?REVIEW OF SYSTEMS: ?ROS   ?Positive for: Cardiovascular ?Negative for: Constitutional, Gastrointestinal, Neurological, Skin,  Genitourinary, Musculoskeletal, HENT, Endocrine, Eyes, Respiratory, Psychiatric, Allergic/Imm, Heme/Lymph ?Last edited by Silvestre Moment on 10/04/2021  9:19 AM.  ?  ? ? ? ?ALLERGIES ?Allergies  ?Allergen Reactions  ? Other Other (See Comments)  ?  Blood Product Refusal (Jehovah's witness) ? ?  ? Tape Other (See Comments)  ?  Leaves blisters and marks on the skin  ? Sulfa Antibiotics Rash  ? Uloric [Febuxostat] Rash  ? ? ?PAST MEDICAL HISTORY ?Past Medical History:  ?Diagnosis Date  ? Acute combined systolic and diastolic congestive heart failure ([place]) 02/05/2017  ? 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 05/20/2018  ? Acute kidney failure ([place]) 05/15/2018  ? Arthritis   ? "all over" (05/22/2018)  ? Blood dyscrasia   ? per pt-has small blood cells-appears as if anemic  ? Breast cancer, left breast ([place]) 1985  ? CHF (congestive heart failure) ([place])   ? Complication of anesthesia   ? difficult to awaken from per pt  ? Coronary artery disease   ? Dyspnea   ? Essential hypertension   ? Heart disease   ? Heart murmur   ? History of gout   ? Hypothyroidism   ? Obesity (BMI 30-39.9) 11/11/2017  ? OSA (obstructive sleep apnea) 02/18/2017  ?  severe obstructive sleep apnea with an AHI of 75.6/h and mild central sleep apnea with a CAI of 7.7/h.  Oxygen saturations dropped as low as 82%.   He is on CPAP at 9 cm H2O.  ? OSA on CPAP   ? Personal history of chemotherapy   ? Personal history of radiation therapy   ? Presence of permanent cardiac pacemaker   ? Primary localized osteoarthritis of right knee 05/13/2018  ? Refusal of blood transfusions as patient is Jehovah's Witness   ? Type II diabetes mellitus ([place])   ? ?Past Surgical History:  ?Procedure Laterality Date  ? APPLICATION OF WOUND VAC Right 04/14/2021  ? Procedure: APPLICATION OF WOUND VAC;  Surgeon: Willaim Sheng, MD;  Location: [facility];  Service: Orthopedics;  Laterality: Right;  ? BREAST BIOPSY Left 1985  ? BREAST LUMPECTOMY Left 1985  ? BREAST  LUMPECTOMY WITH NEEDLE LOCALIZATION AND AXILLARY LYMPH NODE DISSECTION  1985  ? CATARACT EXTRACTION W/ INTRAOCULAR LENS  IMPLANT, BILATERAL Bilateral   ? CORONARY ANGIOPLASTY WITH STENT PLACEMENT  2010, 2012,2017  ?  2 done 2010 and 1 replaced 2012 and 2 replaced in 2017  ? DILATION AND CURETTAGE OF UTERUS    ? EXCISIONAL TOTAL KNEE ARTHROPLASTY WITH ANTIBIOTIC SPACERS Right 07/12/2021  ? Procedure: EXCISIONAL TOTAL KNEE ARTHROPLASTY WITH ANTIBIOTIC SPACERS;  Surgeon: Willaim Sheng, MD;  Location: [facility];  Service: Orthopedics;  Laterality: Right;  ? I & D KNEE WITH POLY EXCHANGE Right 04/14/2021  ? Procedure: IRRIGATION AND DEBRIDEMENT KNEE WITH POLY EXCHANGE;  Surgeon:  Willaim Sheng, MD;  Location: [facility];  Service: Orthopedics;  Laterality: Right;  ? INSERT / REPLACE / REMOVE PACEMAKER  07/19/2014  ? JOINT REPLACEMENT    ? REPLACEMENT TOTAL KNEE Left 09/2012  ? THYROIDECTOMY, PARTIAL  1978  ? TONSILLECTOMY    ? TOTAL KNEE ARTHROPLASTY Right 05/13/2018  ? Procedure: TOTAL KNEE ARTHROPLASTY;  Surgeon: Marchia Bond, MD;  Location: [facility];  Service: Orthopedics;  Laterality: Right;  ? TUBAL LIGATION    ? ? ?FAMILY HISTORY ?Family History  ?Problem Relation Age of Onset  ? Multiple myeloma Mother   ? Heart disease Father   ? Other Sister   ? Heart disease Brother   ? Diabetes Sister   ? Other Sister   ? ? ?SOCIAL HISTORY ?Social History  ? ?Tobacco Use  ? Smoking status: Former  ?  Packs/day: 1.00  ?  Years: 22.00  ?  Pack years: 22.00  ?  Types: Cigarettes  ?  Quit date: 77  ?  Years since quitting: 40.3  ? Smokeless tobacco: Never  ?Vaping Use  ? Vaping Use: Never used  ?Substance Use Topics  ? Alcohol use: Yes  ?  Comment: 05/22/2018 "glass of wine couple times/wk"  ? Drug use: Not Currently  ? ?  ? ?  ? ?OPHTHALMIC EXAM: ? ?Base Eye Exam   ? ? Visual Acuity (ETDRS)   ? ?   Right Left  ? Dist cc 20/30 -1 20/40 -2  ? ? Correction: Glasses  ? ?  ?  ? ? Tonometry (Tonopen, 9:25 AM)   ? ?   Right Left  ?  Pressure 12 15  ? ?  ?  ? ? Pupils   ? ?   Pupils APD  ? Right PERRL None  ? Left PERRL None  ? ?  ?  ? ? Visual Fields   ? ?   Left Right  ?   Full  ? Restrictions Partial outer superior temporal, superior nasal deficien

## 2021-10-04 NOTE — Assessment & Plan Note (Signed)
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  CSME.  We will deliver antivegF OS today follow-up and repeat next in 5 to 6 weeks and then likely focal thereafter ?

## 2021-10-05 ENCOUNTER — Other Ambulatory Visit (HOSPITAL_COMMUNITY): Payer: Self-pay

## 2021-10-05 MED ORDER — FOLIC ACID 1 MG PO TABS: 1.0000 mg | ORAL_TABLET | Freq: Every day | ORAL | 2 refills | Status: DC

## 2021-10-10 DIAGNOSIS — E785 Hyperlipidemia, unspecified: Secondary | ICD-10-CM | POA: Diagnosis not present

## 2021-10-10 DIAGNOSIS — E1165 Type 2 diabetes mellitus with hyperglycemia: Secondary | ICD-10-CM | POA: Diagnosis not present

## 2021-10-10 DIAGNOSIS — I5042 Chronic combined systolic (congestive) and diastolic (congestive) heart failure: Secondary | ICD-10-CM | POA: Diagnosis not present

## 2021-10-10 DIAGNOSIS — I480 Paroxysmal atrial fibrillation: Secondary | ICD-10-CM | POA: Diagnosis not present

## 2021-10-10 DIAGNOSIS — M10331 Gout due to renal impairment, right wrist: Secondary | ICD-10-CM | POA: Diagnosis not present

## 2021-10-10 DIAGNOSIS — M00061 Staphylococcal arthritis, right knee: Secondary | ICD-10-CM | POA: Diagnosis not present

## 2021-10-10 DIAGNOSIS — E1142 Type 2 diabetes mellitus with diabetic polyneuropathy: Secondary | ICD-10-CM | POA: Diagnosis not present

## 2021-10-10 DIAGNOSIS — T8453XD Infection and inflammatory reaction due to internal right knee prosthesis, subsequent encounter: Secondary | ICD-10-CM | POA: Diagnosis not present

## 2021-10-10 DIAGNOSIS — I890 Lymphedema, not elsewhere classified: Secondary | ICD-10-CM | POA: Diagnosis not present

## 2021-10-10 DIAGNOSIS — E113513 Type 2 diabetes mellitus with proliferative diabetic retinopathy with macular edema, bilateral: Secondary | ICD-10-CM | POA: Diagnosis not present

## 2021-10-10 DIAGNOSIS — E1122 Type 2 diabetes mellitus with diabetic chronic kidney disease: Secondary | ICD-10-CM | POA: Diagnosis not present

## 2021-10-10 DIAGNOSIS — E039 Hypothyroidism, unspecified: Secondary | ICD-10-CM | POA: Diagnosis not present

## 2021-10-10 DIAGNOSIS — D509 Iron deficiency anemia, unspecified: Secondary | ICD-10-CM | POA: Diagnosis not present

## 2021-10-10 DIAGNOSIS — I130 Hypertensive heart and chronic kidney disease with heart failure and stage 1 through stage 4 chronic kidney disease, or unspecified chronic kidney disease: Secondary | ICD-10-CM | POA: Diagnosis not present

## 2021-10-10 DIAGNOSIS — Z6833 Body mass index (BMI) 33.0-33.9, adult: Secondary | ICD-10-CM | POA: Diagnosis not present

## 2021-10-10 DIAGNOSIS — D62 Acute posthemorrhagic anemia: Secondary | ICD-10-CM | POA: Diagnosis not present

## 2021-10-10 DIAGNOSIS — G4733 Obstructive sleep apnea (adult) (pediatric): Secondary | ICD-10-CM | POA: Diagnosis not present

## 2021-10-10 DIAGNOSIS — I255 Ischemic cardiomyopathy: Secondary | ICD-10-CM | POA: Diagnosis not present

## 2021-10-10 DIAGNOSIS — B9561 Methicillin susceptible Staphylococcus aureus infection as the cause of diseases classified elsewhere: Secondary | ICD-10-CM | POA: Diagnosis not present

## 2021-10-10 DIAGNOSIS — I2510 Atherosclerotic heart disease of native coronary artery without angina pectoris: Secondary | ICD-10-CM | POA: Diagnosis not present

## 2021-10-10 DIAGNOSIS — R001 Bradycardia, unspecified: Secondary | ICD-10-CM | POA: Diagnosis not present

## 2021-10-10 DIAGNOSIS — D631 Anemia in chronic kidney disease: Secondary | ICD-10-CM | POA: Diagnosis not present

## 2021-10-10 DIAGNOSIS — I82622 Acute embolism and thrombosis of deep veins of left upper extremity: Secondary | ICD-10-CM | POA: Diagnosis not present

## 2021-10-10 DIAGNOSIS — N1831 Chronic kidney disease, stage 3a: Secondary | ICD-10-CM | POA: Diagnosis not present

## 2021-10-11 DIAGNOSIS — G4733 Obstructive sleep apnea (adult) (pediatric): Secondary | ICD-10-CM | POA: Diagnosis not present

## 2021-10-11 DIAGNOSIS — M10331 Gout due to renal impairment, right wrist: Secondary | ICD-10-CM | POA: Diagnosis not present

## 2021-10-11 DIAGNOSIS — B9561 Methicillin susceptible Staphylococcus aureus infection as the cause of diseases classified elsewhere: Secondary | ICD-10-CM | POA: Diagnosis not present

## 2021-10-11 DIAGNOSIS — E1122 Type 2 diabetes mellitus with diabetic chronic kidney disease: Secondary | ICD-10-CM | POA: Diagnosis not present

## 2021-10-11 DIAGNOSIS — I480 Paroxysmal atrial fibrillation: Secondary | ICD-10-CM | POA: Diagnosis not present

## 2021-10-11 DIAGNOSIS — M00061 Staphylococcal arthritis, right knee: Secondary | ICD-10-CM | POA: Diagnosis not present

## 2021-10-11 DIAGNOSIS — I2510 Atherosclerotic heart disease of native coronary artery without angina pectoris: Secondary | ICD-10-CM | POA: Diagnosis not present

## 2021-10-11 DIAGNOSIS — I255 Ischemic cardiomyopathy: Secondary | ICD-10-CM | POA: Diagnosis not present

## 2021-10-11 DIAGNOSIS — D62 Acute posthemorrhagic anemia: Secondary | ICD-10-CM | POA: Diagnosis not present

## 2021-10-11 DIAGNOSIS — I890 Lymphedema, not elsewhere classified: Secondary | ICD-10-CM | POA: Diagnosis not present

## 2021-10-11 DIAGNOSIS — D631 Anemia in chronic kidney disease: Secondary | ICD-10-CM | POA: Diagnosis not present

## 2021-10-11 DIAGNOSIS — T8453XD Infection and inflammatory reaction due to internal right knee prosthesis, subsequent encounter: Secondary | ICD-10-CM | POA: Diagnosis not present

## 2021-10-11 DIAGNOSIS — E1165 Type 2 diabetes mellitus with hyperglycemia: Secondary | ICD-10-CM | POA: Diagnosis not present

## 2021-10-11 DIAGNOSIS — N1831 Chronic kidney disease, stage 3a: Secondary | ICD-10-CM | POA: Diagnosis not present

## 2021-10-11 DIAGNOSIS — D509 Iron deficiency anemia, unspecified: Secondary | ICD-10-CM | POA: Diagnosis not present

## 2021-10-11 DIAGNOSIS — E039 Hypothyroidism, unspecified: Secondary | ICD-10-CM | POA: Diagnosis not present

## 2021-10-11 DIAGNOSIS — I130 Hypertensive heart and chronic kidney disease with heart failure and stage 1 through stage 4 chronic kidney disease, or unspecified chronic kidney disease: Secondary | ICD-10-CM | POA: Diagnosis not present

## 2021-10-11 DIAGNOSIS — E785 Hyperlipidemia, unspecified: Secondary | ICD-10-CM | POA: Diagnosis not present

## 2021-10-11 DIAGNOSIS — I5042 Chronic combined systolic (congestive) and diastolic (congestive) heart failure: Secondary | ICD-10-CM | POA: Diagnosis not present

## 2021-10-11 DIAGNOSIS — E113513 Type 2 diabetes mellitus with proliferative diabetic retinopathy with macular edema, bilateral: Secondary | ICD-10-CM | POA: Diagnosis not present

## 2021-10-11 DIAGNOSIS — Z6833 Body mass index (BMI) 33.0-33.9, adult: Secondary | ICD-10-CM | POA: Diagnosis not present

## 2021-10-11 DIAGNOSIS — E1142 Type 2 diabetes mellitus with diabetic polyneuropathy: Secondary | ICD-10-CM | POA: Diagnosis not present

## 2021-10-11 DIAGNOSIS — R001 Bradycardia, unspecified: Secondary | ICD-10-CM | POA: Diagnosis not present

## 2021-10-11 DIAGNOSIS — I82622 Acute embolism and thrombosis of deep veins of left upper extremity: Secondary | ICD-10-CM | POA: Diagnosis not present

## 2021-10-12 ENCOUNTER — Telehealth (HOSPITAL_COMMUNITY): Payer: Self-pay

## 2021-10-12 ENCOUNTER — Other Ambulatory Visit (HOSPITAL_COMMUNITY): Payer: Self-pay

## 2021-10-12 NOTE — Telephone Encounter (Signed)
Danielle [name] contacted me requesting a refill on her Oxycodone for her knee pain. I advised her that there are no refills on the prescription and we would need to reach out to her orthopedic doctor for refills. I will forward the call to the MD and have them advise. Call complete.

## 2021-10-12 NOTE — Progress Notes (Signed)
Paramedicine Encounter    Patient ID: Danielle [name], female    DOB: Mar 22, 1946, 76 y.o.   MRN: [id number]  Arrived for home visit for Danielle [name] who reports feeling good today aside from her right knee pain. She reports she has taken Tylenol for pain and has used an Oxycodone over the weekend but only has two left and is wanting a refill. I advised her I would find the doctor who prescribed same ad she would have to call for a refill request.   Vitals obtained and are as noted. CBG- 108 (no insulin) Weight is down 1 lbs this week. Some leg edema but less than normal with compression stockings present.   We reviewed upcoming appointments and confirmed same. She set up her transportation with [facility] for tomorrow.   Refills: Allupurinol Oxycodone Folic Acid      Patient Care Team: Seward Carol, MD as PCP - General (Internal Medicine) Constance Haw, MD as PCP - Electrophysiology (Cardiology) Sueanne Margarita, MD as PCP - Sleep Medicine (Cardiology) Bensimhon, Shaune Pascal, MD as PCP - Cardiology (Cardiology) Jorge Ny, LCSW as Social Worker (Licensed Clinical Social Worker) Shamleffer, Melanie Crazier, MD as Consulting Physician (Endocrinology)  Patient Active Problem List   Diagnosis Date Noted   DVT (deep venous thrombosis) ([place]) 07/31/2021   Acute blood loss anemia    Septic Arthritis, Infection of prosthetic right knee joint ([place]), MSSA infection 05/20/8249   Complication of internal right knee prosthesis ([place]) 07/10/2021   CAD (coronary artery disease) 06/08/2021   Carotid artery disease ([place]) 06/08/2021   Symptomatic bradycardia 06/08/2021   Paroxysmal atrial fibrillation ([place]) 06/08/2021   CKD (chronic kidney disease), stage III ([place]) 06/08/2021   Polyneuropathy associated with underlying disease ([place]) 05/04/2020   Severe nonproliferative diabetic retinopathy of left eye, with macular edema, associated with type 2 diabetes mellitus ([place]) 11/05/2019   Severe  nonproliferative diabetic retinopathy of right eye, with macular edema, associated with type 2 diabetes mellitus ([place]) 11/05/2019   Retinal hemorrhage of right eye 11/05/2019   Retinal hemorrhage of left eye 11/05/2019   Diabetes mellitus ([place]) 05/14/2019   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, with long-term current use of insulin ([place]) 05/14/2019   Type 2 diabetes mellitus with diabetic polyneuropathy, with long-term current use of insulin ([place]) 05/14/2019   History of gout 05/20/2018   Primary localized osteoarthritis of right knee 05/13/2018   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) 12/20/2017   Obesity (BMI 30-39.9) 11/11/2017   OSA (obstructive sleep apnea) 02/18/2017   Essential hypertension    L Breast Cancer    Hypothyroidism     Current Outpatient Medications:    acetaminophen (TYLENOL) 500 MG tablet, Take 500-1,000 mg by mouth every 6 (six) hours as needed (for pain or headaches)., Disp: , Rfl:    allopurinol (ZYLOPRIM) 100 MG tablet, Take 100 mg by mouth daily., Disp: , Rfl:    apixaban (ELIQUIS) 5 MG TABS tablet, Take 1 tablet (5 mg total) by mouth 2 (two) times daily., Disp: 60 tablet, Rfl: 11   atorvastatin (LIPITOR) 80 MG tablet, Take 1 tablet (80 mg total) by mouth at bedtime., Disp: 90 tablet, Rfl: 3   Biotin 10 MG TABS, Take 10 mg by mouth daily., Disp: , Rfl:    Blood Glucose Monitoring Suppl (ONETOUCH VERIO) w/Device KIT, Use to check blood sugar, Disp: 1 kit, Rfl: 0   carvedilol (COREG) 3.125 MG tablet, Take 1 tablet (3.125 mg total) by mouth 2 (two) times  daily with a meal., Disp: 60 tablet, Rfl: 6   cefadroxil (DURICEF) 1 g tablet, Take 1 tablet (1 g total) by mouth 2 (two) times daily., Disp: 180 tablet, Rfl: 0   clotrimazole-betamethasone (LOTRISONE) lotion, Apply 1 application topically 2 (two) times daily as needed (itching)., Disp: , Rfl:    Continuous Blood Gluc Sensor (DEXCOM G6 SENSOR) MISC, 1 Device by Does not apply route as  directed. (Patient not taking: Reported on 09/27/2021), Disp: 9 each, Rfl: 3   Continuous Blood Gluc Transmit (DEXCOM G6 TRANSMITTER) MISC, 1 Device by Does not apply route as directed. (Patient not taking: Reported on 09/27/2021), Disp: 1 each, Rfl: 3   empagliflozin (JARDIANCE) 10 MG TABS tablet, Take 10 mg by mouth daily., Disp: , Rfl:    Ferrous Sulfate (IRON) 325 (65 Fe) MG TABS, Take 1 tablet (325 mg total) by mouth 2 (two) times daily before a meal., Disp: , Rfl:    folic acid (FOLVITE) 1 MG tablet, Take 1 tablet (1 mg total) by mouth daily., Disp: 30 tablet, Rfl: 2   gabapentin (NEURONTIN) 300 MG capsule, Take 1 capsule (300 mg total) by mouth 3 (three) times daily., Disp: , Rfl:    Insulin Glargine (BASAGLAR KWIKPEN) 100 UNIT/ML, Inject 40 Units into the skin at bedtime., Disp: , Rfl:    insulin lispro (HUMALOG) 100 UNIT/ML injection, Inject 0.05 mLs (5 Units total) into the skin 3 (three) times daily before meals. (Patient taking differently: Inject 5-15 Units into the skin 3 (three) times daily before meals. Per sliding scale), Disp: 10 mL, Rfl: 0   latanoprost (XALATAN) 0.005 % ophthalmic solution, Place 1 drop into both eyes at bedtime., Disp: , Rfl:    levothyroxine (SYNTHROID, LEVOTHROID) 75 MCG tablet, Take 75 mcg by mouth daily before breakfast., Disp: , Rfl:    losartan (COZAAR) 25 MG tablet, Take 1 tablet (25 mg total) by mouth daily., Disp: 30 tablet, Rfl: 6   LUMIGAN 0.01 % SOLN, Place 1 drop into both eyes at bedtime., Disp: , Rfl:    Multiple Vitamins-Minerals (ALIVE ONCE DAILY WOMENS 50+ PO), Take 1 tablet by mouth daily., Disp: , Rfl:    polyethylene glycol (MIRALAX / GLYCOLAX) 17 g packet, Take 17 g by mouth daily. (Patient not taking: Reported on 10/04/2021), Disp: 14 each, Rfl: 0   senna (SENOKOT) 8.6 MG TABS tablet, Take 1 tablet (8.6 mg total) by mouth 2 (two) times daily. For constipation., Disp: , Rfl: 0   torsemide (DEMADEX) 20 MG tablet, Take 40 mg by mouth daily., Disp: ,  Rfl:    Travoprost, BAK Free, (TRAVATAN) 0.004 % SOLN ophthalmic solution, Place 1 drop into both eyes at bedtime. , Disp: , Rfl:  Allergies  Allergen Reactions   Other Other (See Comments)    Blood Product Refusal (Jehovah's witness)     Tape Other (See Comments)    Leaves blisters and marks on the skin   Sulfa Antibiotics Rash   Uloric [Febuxostat] Rash     Social History   Socioeconomic History   Marital status: Widowed    Spouse name: Not on file   Number of children: 3   Years of education: Not on file   Highest education level: Some college, no degree  Occupational History   Occupation: retired  Tobacco Use   Smoking status: Former    Packs/day: 1.00    Years: 22.00    Pack years: 22.00    Types: Cigarettes    Quit date: 1983  Years since quitting: 40.4   Smokeless tobacco: Never  Vaping Use   Vaping Use: Never used  Substance and Sexual Activity   Alcohol use: Yes    Comment: 05/22/2018 "glass of wine couple times/wk"   Drug use: Not Currently   Sexual activity: Not Currently    Birth control/protection: None, Post-menopausal  Other Topics Concern   Not on file  Social History Narrative   Not on file   Social Determinants of Health   Financial Resource Strain: Not on file  Food Insecurity: Not on file  Transportation Needs: Not on file  Physical Activity: Not on file  Stress: Not on file  Social Connections: Not on file  Intimate Partner Violence: Not on file    Physical Exam      Future Appointments  Date Time Provider [place]  10/13/2021 10:00 AM Benay Pike, MD [facility] None  10/13/2021 10:45 AM [facility] LAB [facility] None  10/24/2021 10:45 AM Vu, Rockey Situ, MD [facility]  10/31/2021  2:30 PM [facility] PA/NP [facility] None  11/15/2021  9:15 AM Rankin, Clent Demark, MD [facility] None  12/14/2021 11:00 AM [facility] DEVICE REMOTES [facility]  03/15/2022 11:00 AM [facility] DEVICE REMOTES [facility]   06/14/2022 11:00 AM [facility] DEVICE REMOTES [facility]     ACTION: Home visit completed

## 2021-10-13 ENCOUNTER — Other Ambulatory Visit: Payer: Self-pay

## 2021-10-13 ENCOUNTER — Inpatient Hospital Stay: Payer: Medicare HMO | Attending: Hematology and Oncology | Admitting: Hematology and Oncology

## 2021-10-13 ENCOUNTER — Other Ambulatory Visit (HOSPITAL_COMMUNITY): Payer: Self-pay | Admitting: *Deleted

## 2021-10-13 ENCOUNTER — Inpatient Hospital Stay: Payer: Medicare HMO

## 2021-10-13 ENCOUNTER — Encounter: Payer: Self-pay | Admitting: Hematology and Oncology

## 2021-10-13 VITALS — BP 149/69 | HR 77 | Temp 97.7°F | Resp 18 | Ht 63.0 in | Wt 202.0 lb

## 2021-10-13 DIAGNOSIS — D509 Iron deficiency anemia, unspecified: Secondary | ICD-10-CM | POA: Diagnosis not present

## 2021-10-13 DIAGNOSIS — E1165 Type 2 diabetes mellitus with hyperglycemia: Secondary | ICD-10-CM | POA: Diagnosis not present

## 2021-10-13 DIAGNOSIS — Z87891 Personal history of nicotine dependence: Secondary | ICD-10-CM | POA: Diagnosis not present

## 2021-10-13 LAB — IRON AND IRON BINDING CAPACITY (CC-WL,HP ONLY)
Iron: 75 ug/dL (ref 28–170)
Saturation Ratios: 25 % (ref 10.4–31.8)
TIBC: 304 ug/dL (ref 250–450)
UIBC: 229 ug/dL (ref 148–442)

## 2021-10-13 LAB — CBC WITH DIFFERENTIAL/PLATELET
Abs Immature Granulocytes: 0.01 10*3/uL (ref 0.00–0.07)
Basophils Absolute: 0 10*3/uL (ref 0.0–0.1)
Basophils Relative: 1 %
Eosinophils Absolute: 0.1 10*3/uL (ref 0.0–0.5)
Eosinophils Relative: 1 %
HCT: 34 % — ABNORMAL LOW (ref 36.0–46.0)
Hemoglobin: 10.6 g/dL — ABNORMAL LOW (ref 12.0–15.0)
Immature Granulocytes: 0 %
Lymphocytes Relative: 44 %
Lymphs Abs: 3.3 10*3/uL (ref 0.7–4.0)
MCH: 21.6 pg — ABNORMAL LOW (ref 26.0–34.0)
MCHC: 31.2 g/dL (ref 30.0–36.0)
MCV: 69.4 fL — ABNORMAL LOW (ref 80.0–100.0)
Monocytes Absolute: 0.5 10*3/uL (ref 0.1–1.0)
Monocytes Relative: 7 %
Neutro Abs: 3.5 10*3/uL (ref 1.7–7.7)
Neutrophils Relative %: 47 %
Platelets: 220 10*3/uL (ref 150–400)
RBC: 4.9 MIL/uL (ref 3.87–5.11)
RDW: 17 % — ABNORMAL HIGH (ref 11.5–15.5)
WBC: 7.4 10*3/uL (ref 4.0–10.5)
nRBC: 0 % (ref 0.0–0.2)

## 2021-10-13 LAB — FERRITIN: Ferritin: 625 ng/mL — ABNORMAL HIGH (ref 11–307)

## 2021-10-13 LAB — VITAMIN B12: Vitamin B-12: 473 pg/mL (ref 180–914)

## 2021-10-13 MED ORDER — FOLIC ACID 1 MG PO TABS: 1.0000 mg | ORAL_TABLET | Freq: Every day | ORAL | 2 refills | Status: DC

## 2021-10-13 NOTE — Progress Notes (Signed)
[facility] NOTE  Patient Care Team: Seward Carol, MD as PCP - General (Internal Medicine) Constance Haw, MD as PCP - Electrophysiology (Cardiology) Sueanne Margarita, MD as PCP - Sleep Medicine (Cardiology) Bensimhon, Shaune Pascal, MD as PCP - Cardiology (Cardiology) Jorge Ny, LCSW as Social Worker (Licensed Clinical Social Worker) Shamleffer, Melanie Crazier, MD as Consulting Physician (Endocrinology)  CHIEF COMPLAINTS/PURPOSE OF CONSULTATION:  Microcytic hypochromic anemia.  ASSESSMENT & PLAN:   This is a pleasant 76 year old female patient with multiple medical comorbidities, history of chronic microcytic hypochromic anemia referred to hematology for recommendations regarding anemia since she is anticipating another surgical procedure and she is in she is Jehovah's Witness.  She tells me that her baseline hemoglobin is around 10 g and she saw hematology many years ago and was told that she has very small red blood cells but she cannot remember the exact etiology of anemia. She denies any issues except for her impaired quality of life given this ongoing knee infection and multiple procedures.  She denies any hematochezia or melena.  She eats a balanced diet and has been taking oral iron as recommended. CBC from today showed a hemoglobin of 10.6 g/dL, microcytic hypochromic, normal white blood cell count and normal platelet count basically labs are back at baseline. Iron panel normal, ferritin pending At this time given hemoglobin over 10 g/dl, we do not recommend any additional supplementation.  We will await ferritin results to give recommendations for iron supplementation. From hematology standpoint, she should be able to proceed with her knee surgery.  She will return to clinic in a couple weeks to review results and to discuss any additional recommendations. Thank you for consulting [place] the care of this patient.  Please not hesitate to contact us with any  additional questions or concerns  HISTORY OF PRESENTING ILLNESS:   Danielle [name] 76 y.o. female is here because of microcytic hypochromic anemia.  This is a very pleasant 76 year old female patient with past medical history significant for congestive heart failure, hypertension, coronary artery disease, obesity, obstructive sleep apnea, personal history of left-sided breast cancer 30 years ago who had right prosthetic knee infection, multiple procedures, antibiotics referred to hematology for correction of anemia since she is anticipating surgery again and since she is Jehovah witness. She denies any major complaints today except she is frustrated about having to go through these procedures again and again and since her quality of life is very impaired.  She does remember seeing a hematologist many years ago and was told that she has microcytic anemia without any clear etiology and her baseline hemoglobin runs around 10 g She needed blood transfusion during this last procedure since her hemoglobin was right under 5 and she was thought to be in a critical state and her daughter agreed to give her blood.  She is now on oral iron supplementation twice a day as well as folic acid supplementation. Rest of the pertinent 10 point ROS reviewed and negative  REVIEW OF SYSTEMS:   Constitutional: Denies fevers, chills or abnormal night sweats Eyes: Denies blurriness of vision, double vision or watery eyes Ears, nose, mouth, throat, and face: Denies mucositis or sore throat Respiratory: Denies cough, dyspnea or wheezes Cardiovascular: Denies palpitation, chest discomfort or lower extremity swelling Gastrointestinal:  Denies nausea, heartburn or change in bowel habits Skin: Denies abnormal skin rashes Lymphatics: Denies new lymphadenopathy or easy bruising Neurological:Denies numbness, tingling or new weaknesses Behavioral/Psych: Mood is stable, no new changes  All other systems were reviewed with the  patient and are negative.  MEDICAL HISTORY:  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[place]) 02/05/2017   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 05/20/2018   Acute kidney failure ([place]) 05/15/2018   Arthritis    "all over" (05/22/2018)   Blood dyscrasia    per pt-has small blood cells-appears as if anemic   Breast cancer, left breast ([place]) 1985   CHF (congestive heart failure) ([facility])    Complication of anesthesia    difficult to awaken from per pt   Coronary artery disease    Dyspnea    Essential hypertension    Heart disease    Heart murmur    History of gout    Hypothyroidism    Obesity (BMI 30-39.9) 11/11/2017   OSA (obstructive sleep apnea) 02/18/2017    severe obstructive sleep apnea with an AHI of 75.6/h and mild central sleep apnea with a CAI of 7.7/h.  Oxygen saturations dropped as low as 82%.   He is on CPAP at 9 cm H2O.   OSA on CPAP    Personal history of chemotherapy    Personal history of radiation therapy    Presence of permanent cardiac pacemaker    Primary localized osteoarthritis of right knee 05/13/2018   Refusal of blood transfusions as patient is Jehovah's Witness    Type II diabetes mellitus ([place])     SURGICAL HISTORY: Past Surgical History:  Procedure Laterality Date   APPLICATION OF WOUND VAC Right 04/14/2021   Procedure: APPLICATION OF WOUND VAC;  Surgeon: Willaim Sheng, MD;  Location: [facility];  Service: Orthopedics;  Laterality: Right;   BREAST BIOPSY Left 1985   BREAST LUMPECTOMY Left 1985   BREAST LUMPECTOMY WITH NEEDLE LOCALIZATION AND AXILLARY LYMPH NODE DISSECTION  1985   CATARACT EXTRACTION W/ INTRAOCULAR LENS  IMPLANT, BILATERAL Bilateral    CORONARY ANGIOPLASTY WITH STENT PLACEMENT  2010, 2012,2017    2 done 2010 and 1 replaced 2012 and 2 replaced in 2017   [place]     EXCISIONAL TOTAL KNEE ARTHROPLASTY WITH ANTIBIOTIC SPACERS Right  07/12/2021   Procedure: EXCISIONAL TOTAL KNEE ARTHROPLASTY WITH ANTIBIOTIC SPACERS;  Surgeon: Willaim Sheng, MD;  Location: [facility];  Service: Orthopedics;  Laterality: Right;   I & D KNEE WITH POLY EXCHANGE Right 04/14/2021   Procedure: IRRIGATION AND DEBRIDEMENT KNEE WITH POLY EXCHANGE;  Surgeon: Willaim Sheng, MD;  Location: [facility];  Service: Orthopedics;  Laterality: Right;   INSERT / REPLACE / REMOVE PACEMAKER  07/19/2014   JOINT REPLACEMENT     REPLACEMENT TOTAL KNEE Left 09/2012   THYROIDECTOMY, PARTIAL  1978   TONSILLECTOMY     TOTAL KNEE ARTHROPLASTY Right 05/13/2018   Procedure: TOTAL KNEE ARTHROPLASTY;  Surgeon: Marchia Bond, MD;  Location: [facility];  Service: Orthopedics;  Laterality: Right;   TUBAL LIGATION      SOCIAL HISTORY: Social History   Socioeconomic History   Marital status: Widowed    Spouse name: Not on file   Number of children: 3   Years of education: Not on file   Highest education level: Some college, no degree  Occupational History   Occupation: retired  Tobacco Use   Smoking status: Former    Packs/day: 1.00    Years: 22.00    Pack years: 22.00    Types: Cigarettes    Quit date: 1983  Years since quitting: 40.4   Smokeless tobacco: Never  Vaping Use   Vaping Use: Never used  Substance and Sexual Activity   Alcohol use: Yes    Comment: 05/22/2018 "glass of wine couple times/wk"   Drug use: Not Currently   Sexual activity: Not Currently    Birth control/protection: None, Post-menopausal  Other Topics Concern   Not on file  Social History Narrative   Not on file   Social Determinants of Health   Financial Resource Strain: Not on file  Food Insecurity: Not on file  Transportation Needs: Not on file  Physical Activity: Not on file  Stress: Not on file  Social Connections: Not on file  Intimate Partner Violence: Not on file    FAMILY HISTORY: Family History  Problem Relation Age of Onset   Multiple myeloma Mother     Heart disease Father    Other Sister    Heart disease Brother    Diabetes Sister    Other Sister     ALLERGIES:  is allergic to other, tape, sulfa antibiotics, and uloric [febuxostat].  MEDICATIONS:  Current Outpatient Medications  Medication Sig Dispense Refill   acetaminophen (TYLENOL) 500 MG tablet Take 500-1,000 mg by mouth every 6 (six) hours as needed (for pain or headaches).     allopurinol (ZYLOPRIM) 100 MG tablet Take 100 mg by mouth daily.     apixaban (ELIQUIS) 5 MG TABS tablet Take 1 tablet (5 mg total) by mouth 2 (two) times daily. 60 tablet 11   atorvastatin (LIPITOR) 80 MG tablet Take 1 tablet (80 mg total) by mouth at bedtime. 90 tablet 3   Biotin 10 MG TABS Take 10 mg by mouth daily.     Blood Glucose Monitoring Suppl (ONETOUCH VERIO) w/Device KIT Use to check blood sugar 1 kit 0   carvedilol (COREG) 3.125 MG tablet Take 1 tablet (3.125 mg total) by mouth 2 (two) times daily with a meal. 60 tablet 6   cefadroxil (DURICEF) 1 g tablet Take 1 tablet (1 g total) by mouth 2 (two) times daily. 180 tablet 0   clotrimazole-betamethasone (LOTRISONE) lotion Apply 1 application topically 2 (two) times daily as needed (itching).     Continuous Blood Gluc Sensor (DEXCOM G6 SENSOR) MISC 1 Device by Does not apply route as directed. 9 each 3   Continuous Blood Gluc Transmit (DEXCOM G6 TRANSMITTER) MISC 1 Device by Does not apply route as directed. 1 each 3   empagliflozin (JARDIANCE) 10 MG TABS tablet Take 10 mg by mouth daily.     Ferrous Sulfate (IRON) 325 (65 Fe) MG TABS Take 1 tablet (325 mg total) by mouth 2 (two) times daily before a meal.     folic acid (FOLVITE) 1 MG tablet Take 1 tablet (1 mg total) by mouth daily. 30 tablet 2   gabapentin (NEURONTIN) 300 MG capsule Take 1 capsule (300 mg total) by mouth 3 (three) times daily.     Insulin Glargine (BASAGLAR KWIKPEN) 100 UNIT/ML Inject 40 Units into the skin at bedtime.     insulin lispro (HUMALOG) 100 UNIT/ML injection Inject  0.05 mLs (5 Units total) into the skin 3 (three) times daily before meals. (Patient taking differently: Inject 5-15 Units into the skin 3 (three) times daily before meals. Per sliding scale) 10 mL 0   latanoprost (XALATAN) 0.005 % ophthalmic solution Place 1 drop into both eyes at bedtime.     levothyroxine (SYNTHROID, LEVOTHROID) 75 MCG tablet Take 75 mcg by mouth daily  before breakfast.     losartan (COZAAR) 25 MG tablet Take 1 tablet (25 mg total) by mouth daily. 30 tablet 6   LUMIGAN 0.01 % SOLN Place 1 drop into both eyes at bedtime.     Multiple Vitamins-Minerals (ALIVE ONCE DAILY WOMENS 50+ PO) Take 1 tablet by mouth daily.     polyethylene glycol (MIRALAX / GLYCOLAX) 17 g packet Take 17 g by mouth daily. 14 each 0   senna (SENOKOT) 8.6 MG TABS tablet Take 1 tablet (8.6 mg total) by mouth 2 (two) times daily. For constipation.  0   torsemide (DEMADEX) 20 MG tablet Take 40 mg by mouth daily.     Travoprost, BAK Free, (TRAVATAN) 0.004 % SOLN ophthalmic solution Place 1 drop into both eyes at bedtime.      No current facility-administered medications for this visit.     PHYSICAL EXAMINATION: ECOG PERFORMANCE STATUS: 0 - Asymptomatic  Vitals:   10/13/21 1002  BP: (!) 149/69  Pulse: 77  Resp: 18  Temp: 97.7 F (36.5 C)  SpO2: 99%   Filed Weights   10/13/21 1002  Weight: 202 lb (91.6 kg)    Physical Exam Constitutional:      Appearance: Normal appearance.  Cardiovascular:     Rate and Rhythm: Normal rate and regular rhythm.     Pulses: Normal pulses.     Heart sounds: Normal heart sounds.  Musculoskeletal:        General: Swelling (Left upper extremity lymphedema.  Bilateral lower extremity swelling noted right greater than left) present.     Cervical back: Normal range of motion and neck supple. No rigidity.  Lymphadenopathy:     Cervical: No cervical adenopathy.  Neurological:     General: No focal deficit present.     Mental Status: She is alert.     LABORATORY  DATA:  I have reviewed the data as listed Lab Results  Component Value Date   WBC 10.1 07/17/2021   HGB 8.5 (L) 08/01/2021   HCT 29.6 (L) 08/01/2021   MCV 74.7 (L) 07/17/2021   PLT 361 07/17/2021     Chemistry      Component Value Date/Time   NA 141 09/21/2021 1211   K 4.2 09/21/2021 1211   CL 102 09/21/2021 1211   CO2 30 09/21/2021 1211   BUN 19 09/21/2021 1211   CREATININE 1.04 (H) 09/21/2021 1211      Component Value Date/Time   CALCIUM 9.6 09/21/2021 1211   ALKPHOS 70 07/10/2021 1404   AST 19 09/21/2021 1211   ALT 13 09/21/2021 1211   BILITOT 0.6 09/21/2021 1211       RADIOGRAPHIC STUDIES: I have personally reviewed the radiological images as listed and agreed with the findings in the report. Intravitreal Injection, Pharmacologic Agent - OS - Left Eye  Result Date: 10/04/2021 Time Out 10/04/2021. 10:12 AM. Confirmed correct patient, procedure, site, and patient consented. Anesthesia Topical anesthesia was used. Anesthetic medications included Lidocaine 4%. Procedure Preparation included Tobramycin 0.3%, 10% betadine to eyelids, 5% betadine to ocular surface. A 30 gauge needle was used. Injection: 2.5 mg bevacizumab 2.5 MG/0.1ML   Route: Intravitreal, Site: Left Eye   NDC: [phone], Lot: [id number] Post-op Post injection exam found visual acuity of at least counting fingers. The patient tolerated the procedure well. There were no complications. The patient received written and verbal post procedure care education. Post injection medications included ocuflox.   OCT, Retina - OU - Both Eyes  Result Date: 10/04/2021 Right  Eye Quality was good. Scan locations included subfoveal. Central Foveal Thickness: 273. Progression has been stable. Findings include normal foveal contour. Left Eye Quality was good. Scan locations included subfoveal. Central Foveal Thickness: 293. Progression has improved. Findings include cystoid macular edema. Notes CSME, with protected fovea, not center  involved, temporally OS.  Will need AVASTIN OS,, now, and focal laser OS SOON  Color Fundus Photography Optos - OU - Both Eyes  Result Date: 10/04/2021 Right Eye Progression has been stable. Disc findings include normal observations. Macula : microaneurysms. Left Eye Progression has improved. Disc findings include normal observations. Macula : edema, exudates, microaneurysms. Notes Bilateral severe NPDR good focal laser treatment right eye and left eye OS with persistent retinal exudates temporal to FAZ threatening vision.  CSME.  We will deliver antivegF OS today follow-up and repeat next in 5 to 6 weeks and then likely focal thereafter   All questions were answered. The patient knows to call the clinic with any problems, questions or concerns. I spent 45 minutes in the care of this patient including H and P, review of records, counseling and coordination of care.     Benay Pike, MD 10/13/2021 10:25 AM

## 2021-10-16 LAB — HGB FRACTIONATION CASCADE
Hgb A2: 5.5 % — ABNORMAL HIGH (ref 1.8–3.2)
Hgb A: 93 % — ABNORMAL LOW (ref 96.4–98.8)
Hgb F: 1.5 % (ref 0.0–2.0)
Hgb S: 0 %

## 2021-10-19 ENCOUNTER — Other Ambulatory Visit (HOSPITAL_COMMUNITY): Payer: Self-pay

## 2021-10-19 DIAGNOSIS — I480 Paroxysmal atrial fibrillation: Secondary | ICD-10-CM | POA: Diagnosis not present

## 2021-10-19 DIAGNOSIS — I5020 Unspecified systolic (congestive) heart failure: Secondary | ICD-10-CM | POA: Diagnosis not present

## 2021-10-19 DIAGNOSIS — E039 Hypothyroidism, unspecified: Secondary | ICD-10-CM | POA: Diagnosis not present

## 2021-10-19 DIAGNOSIS — I10 Essential (primary) hypertension: Secondary | ICD-10-CM | POA: Diagnosis not present

## 2021-10-19 DIAGNOSIS — E113413 Type 2 diabetes mellitus with severe nonproliferative diabetic retinopathy with macular edema, bilateral: Secondary | ICD-10-CM | POA: Diagnosis not present

## 2021-10-19 DIAGNOSIS — E7800 Pure hypercholesterolemia, unspecified: Secondary | ICD-10-CM | POA: Diagnosis not present

## 2021-10-19 NOTE — Progress Notes (Signed)
Paramedicine Encounter    Patient ID: Danielle [name], female    DOB: [birth year], 76 y.o.   MRN: [id number]  Arrived to [facility] for a follow up/med visit. She reports feeling good with no complaints. She denied any shortness of breath or dizziness or chest pain. She reports her knee is feeling better and she is moving easier. She also reports her Dexcom was delivered and her daughter is going to pick it up from apartment office today and I will place it next week. She has plenty of testing supplies until then. CBG today 137   I reviewed meds and confirmed dosing and filled pill box for one week. I plan to come out in one week for full visit.   Appointments reviewed and recorded.   Refills: Losartan Ferrous Sulfate            Patient Care Team: Seward Carol, MD as PCP - General (Internal Medicine) Constance Haw, MD as PCP - Electrophysiology (Cardiology) Sueanne Margarita, MD as PCP - Sleep Medicine (Cardiology) Bensimhon, Shaune Pascal, MD as PCP - Cardiology (Cardiology) Jorge Ny, LCSW as Social Worker (Licensed Clinical Social Worker) Shamleffer, Melanie Crazier, MD as Consulting Physician (Endocrinology)  Patient Active Problem List   Diagnosis Date Noted   DVT (deep venous thrombosis) ([place]) 07/31/2021   Acute blood loss anemia    Septic Arthritis, Infection of prosthetic right knee joint ([place]), MSSA infection 82/50/5397   Complication of internal right knee prosthesis ([place]) 07/10/2021   CAD (coronary artery disease) 06/08/2021   Carotid artery disease ([place]) 06/08/2021   Symptomatic bradycardia 06/08/2021   Paroxysmal atrial fibrillation ([place]) 06/08/2021   CKD (chronic kidney disease), stage III ([place]) 06/08/2021   Polyneuropathy associated with underlying disease ([place]) 05/04/2020   Severe nonproliferative diabetic retinopathy of left eye, with macular edema, associated with type 2 diabetes mellitus ([place]) 11/05/2019   Severe nonproliferative diabetic retinopathy of  right eye, with macular edema, associated with type 2 diabetes mellitus ([place]) 11/05/2019   Retinal hemorrhage of right eye 11/05/2019   Retinal hemorrhage of left eye 11/05/2019   Diabetes mellitus ([place]) 05/14/2019   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, with long-term current use of insulin ([place]) 05/14/2019   Type 2 diabetes mellitus with diabetic polyneuropathy, with long-term current use of insulin ([place]) 05/14/2019   History of gout 05/20/2018   Primary localized osteoarthritis of right knee 05/13/2018   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) 12/20/2017   Obesity (BMI 30-39.9) 11/11/2017   OSA (obstructive sleep apnea) 02/18/2017   Essential hypertension    L Breast Cancer    Hypothyroidism     Current Outpatient Medications:    acetaminophen (TYLENOL) 500 MG tablet, Take 500-1,000 mg by mouth every 6 (six) hours as needed (for pain or headaches)., Disp: , Rfl:    allopurinol (ZYLOPRIM) 100 MG tablet, Take 100 mg by mouth daily., Disp: , Rfl:    apixaban (ELIQUIS) 5 MG TABS tablet, Take 1 tablet (5 mg total) by mouth 2 (two) times daily., Disp: 60 tablet, Rfl: 11   atorvastatin (LIPITOR) 80 MG tablet, Take 1 tablet (80 mg total) by mouth at bedtime., Disp: 90 tablet, Rfl: 3   Biotin 10 MG TABS, Take 10 mg by mouth daily., Disp: , Rfl:    Blood Glucose Monitoring Suppl (ONETOUCH VERIO) w/Device KIT, Use to check blood sugar, Disp: 1 kit, Rfl: 0   carvedilol (COREG) 3.125 MG tablet, Take 1 tablet (3.125 mg total) by mouth 2 (two)  times daily with a meal., Disp: 60 tablet, Rfl: 6   cefadroxil (DURICEF) 1 g tablet, Take 1 tablet (1 g total) by mouth 2 (two) times daily., Disp: 180 tablet, Rfl: 0   clotrimazole-betamethasone (LOTRISONE) lotion, Apply 1 application topically 2 (two) times daily as needed (itching)., Disp: , Rfl:    Continuous Blood Gluc Sensor (DEXCOM G6 SENSOR) MISC, 1 Device by Does not apply route as directed., Disp: 9 each, Rfl: 3   Continuous  Blood Gluc Transmit (DEXCOM G6 TRANSMITTER) MISC, 1 Device by Does not apply route as directed., Disp: 1 each, Rfl: 3   empagliflozin (JARDIANCE) 10 MG TABS tablet, Take 10 mg by mouth daily., Disp: , Rfl:    Ferrous Sulfate (IRON) 325 (65 Fe) MG TABS, Take 1 tablet (325 mg total) by mouth 2 (two) times daily before a meal., Disp: , Rfl:    folic acid (FOLVITE) 1 MG tablet, Take 1 tablet (1 mg total) by mouth daily., Disp: 30 tablet, Rfl: 2   gabapentin (NEURONTIN) 300 MG capsule, Take 1 capsule (300 mg total) by mouth 3 (three) times daily., Disp: , Rfl:    Insulin Glargine (BASAGLAR KWIKPEN) 100 UNIT/ML, Inject 40 Units into the skin at bedtime., Disp: , Rfl:    insulin lispro (HUMALOG) 100 UNIT/ML injection, Inject 0.05 mLs (5 Units total) into the skin 3 (three) times daily before meals. (Patient taking differently: Inject 5-15 Units into the skin 3 (three) times daily before meals. Per sliding scale), Disp: 10 mL, Rfl: 0   latanoprost (XALATAN) 0.005 % ophthalmic solution, Place 1 drop into both eyes at bedtime., Disp: , Rfl:    levothyroxine (SYNTHROID, LEVOTHROID) 75 MCG tablet, Take 75 mcg by mouth daily before breakfast., Disp: , Rfl:    losartan (COZAAR) 25 MG tablet, Take 1 tablet (25 mg total) by mouth daily., Disp: 30 tablet, Rfl: 6   LUMIGAN 0.01 % SOLN, Place 1 drop into both eyes at bedtime., Disp: , Rfl:    Multiple Vitamins-Minerals (ALIVE ONCE DAILY WOMENS 50+ PO), Take 1 tablet by mouth daily., Disp: , Rfl:    polyethylene glycol (MIRALAX / GLYCOLAX) 17 g packet, Take 17 g by mouth daily., Disp: 14 each, Rfl: 0   senna (SENOKOT) 8.6 MG TABS tablet, Take 1 tablet (8.6 mg total) by mouth 2 (two) times daily. For constipation., Disp: , Rfl: 0   torsemide (DEMADEX) 20 MG tablet, Take 40 mg by mouth daily., Disp: , Rfl:    Travoprost, BAK Free, (TRAVATAN) 0.004 % SOLN ophthalmic solution, Place 1 drop into both eyes at bedtime. , Disp: , Rfl:  Allergies  Allergen Reactions   Other  Other (See Comments)    Blood Product Refusal (Jehovah's witness)     Tape Other (See Comments)    Leaves blisters and marks on the skin   Sulfa Antibiotics Rash   Uloric [Febuxostat] Rash     Social History   Socioeconomic History   Marital status: Widowed    Spouse name: Not on file   Number of children: 3   Years of education: Not on file   Highest education level: Some college, no degree  Occupational History   Occupation: retired  Tobacco Use   Smoking status: Former    Packs/day: 1.00    Years: 22.00    Pack years: 22.00    Types: Cigarettes    Quit date: 1983    Years since quitting: 40.4   Smokeless tobacco: Never  Vaping Use  Vaping Use: Never used  Substance and Sexual Activity   Alcohol use: Yes    Comment: 05/22/2018 "glass of wine couple times/wk"   Drug use: Not Currently   Sexual activity: Not Currently    Birth control/protection: None, Post-menopausal  Other Topics Concern   Not on file  Social History Narrative   Not on file   Social Determinants of Health   Financial Resource Strain: Not on file  Food Insecurity: Not on file  Transportation Needs: Not on file  Physical Activity: Not on file  Stress: Not on file  Social Connections: Not on file  Intimate Partner Violence: Not on file    Physical Exam      Future Appointments  Date Time Provider [place]  10/24/2021 10:45 AM Jabier Mutton, MD [facility]  10/31/2021  2:30 PM [facility] PA/NP [facility] None  11/01/2021  1:45 PM Benay Pike, MD [facility] None  11/15/2021  9:15 AM Rankin, Clent Demark, MD [facility] None  12/14/2021 11:00 AM [facility] DEVICE REMOTES [facility]  03/15/2022 11:00 AM [facility] DEVICE REMOTES [facility]  06/14/2022 11:00 AM [facility] DEVICE REMOTES [facility]     ACTION: Home visit completed

## 2021-10-24 ENCOUNTER — Ambulatory Visit: Payer: Medicare HMO | Admitting: Internal Medicine

## 2021-10-24 ENCOUNTER — Encounter (HOSPITAL_COMMUNITY): Payer: Medicare HMO

## 2021-10-24 ENCOUNTER — Encounter: Payer: Self-pay | Admitting: Internal Medicine

## 2021-10-24 ENCOUNTER — Other Ambulatory Visit: Payer: Self-pay

## 2021-10-24 VITALS — BP 174/74 | HR 70 | Temp 97.3°F

## 2021-10-24 DIAGNOSIS — T8453XD Infection and inflammatory reaction due to internal right knee prosthesis, subsequent encounter: Secondary | ICD-10-CM | POA: Diagnosis not present

## 2021-10-24 DIAGNOSIS — Z96659 Presence of unspecified artificial knee joint: Secondary | ICD-10-CM

## 2021-10-24 DIAGNOSIS — T8459XD Infection and inflammatory reaction due to other internal joint prosthesis, subsequent encounter: Secondary | ICD-10-CM | POA: Diagnosis not present

## 2021-10-24 NOTE — Patient Instructions (Signed)
Stop antibiotics today   Check blood today   In no earlier than 2 weeks, please see your surgeon --> we need to culture the right knee and make sure the bacteria is no longer there    As you had 12-13 weeks of antibiotics and your knee looking and feeling better and the labs supporting improvement we are there now to see if the culture will be negative. This will be the final piece of information to say you are as optimized/cured at this time to get a new knee

## 2021-10-24 NOTE — Progress Notes (Signed)
[facility]  Patient Active Problem List   Diagnosis Date Noted   DVT (deep venous thrombosis) ([place]) 07/31/2021   Acute blood loss anemia    Septic Arthritis, Infection of prosthetic right knee joint ([place]), MSSA infection 88/89/1694   Complication of internal right knee prosthesis ([place]) 07/10/2021   CAD (coronary artery disease) 06/08/2021   Carotid artery disease ([place]) 06/08/2021   Symptomatic bradycardia 06/08/2021   Paroxysmal atrial fibrillation ([place]) 06/08/2021   CKD (chronic kidney disease), stage III ([place]) 06/08/2021   Polyneuropathy associated with underlying disease ([place]) 05/04/2020   Severe nonproliferative diabetic retinopathy of left eye, with macular edema, associated with type 2 diabetes mellitus ([place]) 11/05/2019   Severe nonproliferative diabetic retinopathy of right eye, with macular edema, associated with type 2 diabetes mellitus ([place]) 11/05/2019   Retinal hemorrhage of right eye 11/05/2019   Retinal hemorrhage of left eye 11/05/2019   Diabetes mellitus ([place]) 05/14/2019   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   MRN: [id number]  Cc: f/u hospital admission for MSSA PJI  HPI:  Danielle [name] is a 76 y.o. female obese, hfrEF, faib on eliquis, ckd3, here for f/u recurrent right knee mssa pji  I reviewed notes from recent hospital admissions in 06/2021 She was evaluated by my  partner dr Juleen China "S/p DAIR 03/2021 with or cx mssa; s/p 6 weeks cefazolin; on suppressive cephalexin. Recurrent infection in setting of being out of abx for 1 week. S/p explant with abx spacer 07/12/2021 -- or cx again with mssa." Afib on eliquis -- rifampin not used previously due to ddi  She was supposed to finish iv abx on 08/23/21  She missed a follow up clinic appointment recently.  She just left the nursing home today ([facility])  Ortho is dr Zachery Dakins  ------ 08/31/21 id clinic visit She was in snf previously and our office was unable to get labs Our staff is trying to get the labs again as of now She has occasional severe pain  No f/c No n/v/diarrhea No wound break down/redness/swelling She is yet to make f/u ortho clinic appointment  4/27 id clinic f/u He saw her orthopedic surgeon last week -- who wants to place in a new knee She has mild pain of the right knee; her left knee prosthetic joint is without any pain/swelling Previous snf labs -- 4/3 cbc 8/9.5/280; cr 1.34; no crp or lft We checked crp 4/6 at the id clinic and it was 5.4 (<10) We started her on cefadroxil last visit for chronic suppression    Allergies  Allergen Reactions   Other Other (See Comments)    Blood Product Refusal (Jehovah's witness)     Tape Other (See Comments)    Leaves blisters and marks on the skin   Sulfa Antibiotics Rash   Uloric [Febuxostat] Rash      Outpatient Medications Prior to  Visit  Medication Sig Dispense Refill   acetaminophen (TYLENOL) 500 MG tablet Take 500-1,000 mg by mouth every 6 (six) hours as needed (for pain or headaches).     allopurinol (ZYLOPRIM) 100 MG tablet Take 100 mg by mouth daily.     apixaban (ELIQUIS) 5 MG TABS tablet Take 1 tablet (5 mg total) by mouth 2 (two) times daily. 60 tablet 11   atorvastatin (LIPITOR) 80 MG tablet Take 1 tablet (80 mg total) by mouth at bedtime. 90 tablet 3   Biotin 10 MG TABS Take 10 mg by mouth daily.      Blood Glucose Monitoring Suppl (ONETOUCH VERIO) w/Device KIT Use to check blood sugar 1 kit 0   carvedilol (COREG) 3.125 MG tablet Take 1 tablet (3.125 mg total) by mouth 2 (two) times daily with a meal. 60 tablet 6   cefadroxil (DURICEF) 1 g tablet Take 1 tablet (1 g total) by mouth 2 (two) times daily. 180 tablet 0   clotrimazole-betamethasone (LOTRISONE) lotion Apply 1 application topically 2 (two) times daily as needed (itching).     Continuous Blood Gluc Sensor (DEXCOM G6 SENSOR) MISC 1 Device by Does not apply route as directed. 9 each 3   Continuous Blood Gluc Transmit (DEXCOM G6 TRANSMITTER) MISC 1 Device by Does not apply route as directed. 1 each 3   empagliflozin (JARDIANCE) 10 MG TABS tablet Take 10 mg by mouth daily.     Ferrous Sulfate (IRON) 325 (65 Fe) MG TABS Take 1 tablet (325 mg total) by mouth 2 (two) times daily before a meal.     folic acid (FOLVITE) 1 MG tablet Take 1 tablet (1 mg total) by mouth daily. 30 tablet 2   gabapentin (NEURONTIN) 300 MG capsule Take 1 capsule (300 mg total) by mouth 3 (three) times daily.     Insulin Glargine (BASAGLAR KWIKPEN) 100 UNIT/ML Inject 40 Units into the skin at bedtime.     insulin lispro (HUMALOG) 100 UNIT/ML injection Inject 0.05 mLs (5 Units total) into the skin 3 (three) times daily before meals. (Patient taking differently: Inject 5-15 Units into the skin 3 (three) times daily before meals. Per sliding scale) 10 mL 0   latanoprost (XALATAN) 0.005 % ophthalmic solution Place 1 drop into both eyes at bedtime.     levothyroxine (SYNTHROID, LEVOTHROID) 75 MCG tablet Take 75 mcg by mouth daily before breakfast.     losartan (COZAAR) 25 MG tablet Take 1 tablet (25 mg total) by mouth daily. 30 tablet 6   LUMIGAN 0.01 % SOLN Place 1 drop into both eyes at bedtime.     Multiple Vitamins-Minerals (ALIVE ONCE DAILY WOMENS 50+ PO) Take 1 tablet by mouth daily.     polyethylene glycol (MIRALAX / GLYCOLAX) 17 g packet Take 17 g by mouth daily. 14  each 0   senna (SENOKOT) 8.6 MG TABS tablet Take 1 tablet (8.6 mg total) by mouth 2 (two) times daily. For constipation.  0   torsemide (DEMADEX) 20 MG tablet Take 40 mg by mouth daily.     Travoprost, BAK Free, (TRAVATAN) 0.004 % SOLN ophthalmic solution Place 1 drop into both eyes at bedtime.      No facility-administered medications prior to visit.     Social History   Socioeconomic History   Marital status: Widowed    Spouse name: Not on file   Number of children: 3   Years of education: Not on file   Highest education level: Some college, no degree  Occupational History   Occupation: retired  Tobacco Use   Smoking status: Former    Packs/day: 1.00    Years: 22.00    Pack years: 22.00    Types: Cigarettes    Quit date: 1983    Years since quitting: 40.4   Smokeless tobacco: Never  Vaping Use   Vaping Use: Never used  Substance and Sexual Activity   Alcohol use: Yes    Comment: 05/22/2018 "glass of wine couple times/wk"   Drug use: Not Currently   Sexual activity: Not Currently    Birth control/protection: None, Post-menopausal  Other Topics Concern   Not on file  Social History Narrative   Not on file   Social Determinants of Health   Financial Resource Strain: Not on file  Food Insecurity: Not on file  Transportation Needs: Not on file  Physical Activity: Not on file  Stress: Not on file  Social Connections: Not on file  Intimate Partner Violence: Not on file      Review of Systems    All other ros negative  Objective:    BP (!) 174/74   Pulse 70   Temp (!) 97.3 F (36.3 C) (Temporal)   SpO2 100%  Nursing note and vital signs reviewed.  Physical Exam     General/constitutional: no distress, pleasant HEENT: Normocephalic, PER, Conj Clear, EOMI, Oropharynx clear Neck supple CV: rrr no mrg Lungs: clear to auscultation, normal respiratory effort Abd: Soft, Nontender Ext: no edema Skin: No Rash Neuro: nonfocal MSK: no peripheral joint  swelling/tenderness/warmth; back spines nontender      Labs: Lab Results  Component Value Date   WBC 7.4 10/13/2021   HGB 10.6 (L) 10/13/2021   HCT 34.0 (L) 10/13/2021   MCV 69.4 (L) 10/13/2021   PLT 220 83/66/2947   Last metabolic panel Lab Results  Component Value Date   GLUCOSE 217 (H) 09/21/2021   NA 141 09/21/2021   K 4.2 09/21/2021   CL 102 09/21/2021   CO2 30 09/21/2021   BUN 19 09/21/2021   CREATININE 1.04 (H) 09/21/2021   GFRNONAA 54 (L) 07/28/2021   CALCIUM 9.6 09/21/2021   PHOS 2.1 (L) 07/15/2021   PROT 7.3 09/21/2021   ALBUMIN 3.3 (L) 07/10/2021   BILITOT 0.6 09/21/2021   ALKPHOS 70 07/10/2021   AST 19 09/21/2021   ALT 13 09/21/2021   ANIONGAP 13 07/16/2021    Crp: 09/21/21         2  (<10) 08/31/21         5.4 (<10) 07/10/21       28.2 (<1) 11/22            1.7 (<1)  Micro:  Serology:  Imaging:  Assessment & Plan:   Problem List Items Addressed This Visit   None Visit Diagnoses     Chronic infection of knee joint prosthesis, subsequent encounter    -  Primary   Relevant Orders   C-reactive protein       No orders of the defined types were placed in this encounter.  76 yo female here with recurrent right pji mssa, s/p explant 07/12/21 with abx spacer placement. Also has non-involved left prosthetic knee  Discussed newer data of suggesting 12 weeks for prosthetic joint infection tx Given staph aureus and also no avaiablility of labs, will continue her on oral abx and see what her surgeon want to do  She finished iv on 3/29  Start cefadroxil 1 gram bid today 4/06 If surgery  wants to proceed sooner, we can see based on labs (crp) and how she does) and stop abx at the appropriate time to reaspirate the knee 2 weeks after that   09/21/21 id assessment She is s/p abx spacer 07/12/2021, 6 weeks cefazolin and transitioned to cefadroxil on 4/06. She is about 9 weeks out now Her last crp 4/06 is normal  She plans on getting the 2nd stage  repair. She'll see her orthopedic surgeon [phone]).   She is not sure about the timing if she wants to do it now or later in summer.  -crp today; if normal will take her off of antibiotics and let her orthopedic surgeon know to sample the knee joint at least 2 weeks off antibiotics -follow up with me in 4 weeks -f/u with her orthopedics surgeon -will send a copy of our chart to her surgeon fax. [phone] -will continue cefadroxil for now until she decides   10/24/21 id assessment  Her crp continues to be normal She is using no opioid at this time outside of tylenol Discuss with her she has had 12-13 weeks of abx despite having no hardware there (recent data showed long term abx in DAIR rather than her, but we are doing for her given recurrent infection)  At this time we need to sample the joint off antibiotics.   -stop abx today -repeat crp -at least 2 weeks from today will need to repeat sampling of the knee -will send her orthopedic surgeon this note to follow up with patient in around 2-4 weeks -discussed with patient that the risk for recurrent infection is there and the benefit of a new knee vs risk will need to be decided among her and ID and ortho. At this time I do feel the risk benefit ratio is still in patient's favor of getting a new knee   -periooperative abx per protocol, but otherwise no need for empiric preemptive long term antibiotics with the next knee surgery  I have spent a total of 30 minutes of face-to-face and non-face-to-face time, excluding clinical staff time, preparing to see patient, ordering tests and/or medications, and provide counseling the patient      Follow-up: Return in about 8 weeks (around 12/19/2021).      Jabier Mutton, [place] for [place] [phone]  pager   [phone] cell 10/24/2021, 11:19 AM

## 2021-10-25 ENCOUNTER — Telehealth (HOSPITAL_COMMUNITY): Payer: Self-pay

## 2021-10-25 LAB — C-REACTIVE PROTEIN: CRP: 3.6 mg/L (ref <8.0)

## 2021-10-25 NOTE — Telephone Encounter (Signed)
Spoke to [employer] to try to get a refill on Humalog however [facility] [employer] reports her patient assistance needs to be renewed as it expired 12/22. I will print out application and have her fill it out and I will take it to [facility] for Dr. Kelton Pillar to complete physician portion.

## 2021-10-26 ENCOUNTER — Other Ambulatory Visit (HOSPITAL_COMMUNITY): Payer: Self-pay

## 2021-10-26 NOTE — Progress Notes (Signed)
Paramedicine Encounter    Patient ID: Danielle [name], female    DOB: 1945-12-05, 76 y.o.   MRN: [id number]  Arrived for home visit for [place] who is alert and oriented reporting to be feeling okay today. She denied chest pain, shortness of breath, dizziness. She has been med compliant over the last week.   Assessment and vitals obtained. She has some lower leg edema today on assessment. Weight is down one lb this week. She denied shortness of breath. Lung sounds clear.  Dexcom in place and CBG is reading 267. She reports she just had a meal.   Medications were verified and confirmed. She is on her last Humalog pen, I am working on getting income proof from her daughter to Veterinary surgeon for this to be submitted to her endocrinologist so she can continue to receive this through mail order for free. She has one pen of Lantus left. I called in same for refills today- it needs new refills sent in.  I filled one pill box for [name] and plan to see [name] in clinic next week. She agreed with plan.   We reviewed diabetic and heart failure management and reminded her of sodium and fluid restriction, healthy diet habits and continuing to check her sugar using her dexcom  multiple times daily. She verbalized understanding.   Home visit complete. I will see [name] in one week.   Refills: Eliquis Lantus (out of refills) Ferrous Sulfate (out of refills) Carvedilol Losartan Gabapentin   [name], [place] 10/26/2021    Patient Care Team: Seward Carol, MD as PCP - General (Internal Medicine) Constance Haw, MD as PCP - Electrophysiology (Cardiology) Sueanne Margarita, MD as PCP - Sleep Medicine (Cardiology) Bensimhon, Shaune Pascal, MD as PCP - Cardiology (Cardiology) Jorge Ny, LCSW as Social Worker (Licensed Clinical Social Worker) Shamleffer, Melanie Crazier, MD as Consulting Physician (Endocrinology)  Patient Active Problem List   Diagnosis Date  Noted   DVT (deep venous thrombosis) ([place]) 07/31/2021   Acute blood loss anemia    Septic Arthritis, Infection of prosthetic right knee joint ([place]), MSSA infection 28/00/3491   Complication of internal right knee prosthesis ([place]) 07/10/2021   CAD (coronary artery disease) 06/08/2021   Carotid artery disease ([place]) 06/08/2021   Symptomatic bradycardia 06/08/2021   Paroxysmal atrial fibrillation ([employer]) 06/08/2021   CKD (chronic kidney disease), stage III ([place]) 06/08/2021   Polyneuropathy associated with underlying disease ([place]) 05/04/2020   Severe nonproliferative diabetic retinopathy of left eye, with macular edema, associated with type 2 diabetes mellitus ([place]) 11/05/2019   Severe nonproliferative diabetic retinopathy of right eye, with macular edema, associated with type 2 diabetes mellitus ([place]) 11/05/2019   Retinal hemorrhage of right eye 11/05/2019   Retinal hemorrhage of left eye 11/05/2019   Diabetes mellitus ([place]) 05/14/2019   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, with long-term current use of insulin ([place]) 05/14/2019   Type 2 diabetes mellitus with diabetic polyneuropathy, with long-term current use of insulin ([place]) 05/14/2019   History of gout 05/20/2018   Primary localized osteoarthritis of right knee 05/13/2018   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) 12/20/2017   Obesity (BMI 30-39.9) 11/11/2017   OSA (obstructive sleep apnea) 02/18/2017   Essential hypertension    L Breast Cancer    Hypothyroidism     Current Outpatient Medications:    acetaminophen (TYLENOL) 500 MG tablet, Take 500-1,000 mg by mouth every 6 (six) hours as needed (for pain or headaches)., Disp: , Rfl:  allopurinol (ZYLOPRIM) 100 MG tablet, Take 100 mg by mouth daily., Disp: , Rfl:    apixaban (ELIQUIS) 5 MG TABS tablet, Take 1 tablet (5 mg total) by mouth 2 (two) times daily., Disp: 60 tablet, Rfl: 11   atorvastatin (LIPITOR) 80 MG tablet, Take 1 tablet (80 mg total) by mouth at  bedtime., Disp: 90 tablet, Rfl: 3   Biotin 10 MG TABS, Take 10 mg by mouth daily., Disp: , Rfl:    Blood Glucose Monitoring Suppl (ONETOUCH VERIO) w/Device KIT, Use to check blood sugar, Disp: 1 kit, Rfl: 0   carvedilol (COREG) 3.125 MG tablet, Take 1 tablet (3.125 mg total) by mouth 2 (two) times daily with a meal., Disp: 60 tablet, Rfl: 6   cefadroxil (DURICEF) 1 g tablet, Take 1 tablet (1 g total) by mouth 2 (two) times daily., Disp: 180 tablet, Rfl: 0   clotrimazole-betamethasone (LOTRISONE) lotion, Apply 1 application topically 2 (two) times daily as needed (itching)., Disp: , Rfl:    Continuous Blood Gluc Sensor (DEXCOM G6 SENSOR) MISC, 1 Device by Does not apply route as directed., Disp: 9 each, Rfl: 3   Continuous Blood Gluc Transmit (DEXCOM G6 TRANSMITTER) MISC, 1 Device by Does not apply route as directed., Disp: 1 each, Rfl: 3   empagliflozin (JARDIANCE) 10 MG TABS tablet, Take 10 mg by mouth daily., Disp: , Rfl:    Ferrous Sulfate (IRON) 325 (65 Fe) MG TABS, Take 1 tablet (325 mg total) by mouth 2 (two) times daily before a meal., Disp: , Rfl:    folic acid (FOLVITE) 1 MG tablet, Take 1 tablet (1 mg total) by mouth daily., Disp: 30 tablet, Rfl: 2   gabapentin (NEURONTIN) 300 MG capsule, Take 1 capsule (300 mg total) by mouth 3 (three) times daily., Disp: , Rfl:    Insulin Glargine (BASAGLAR KWIKPEN) 100 UNIT/ML, Inject 40 Units into the skin at bedtime., Disp: , Rfl:    insulin lispro (HUMALOG) 100 UNIT/ML injection, Inject 0.05 mLs (5 Units total) into the skin 3 (three) times daily before meals. (Patient taking differently: Inject 5-15 Units into the skin 3 (three) times daily before meals. Per sliding scale), Disp: 10 mL, Rfl: 0   latanoprost (XALATAN) 0.005 % ophthalmic solution, Place 1 drop into both eyes at bedtime., Disp: , Rfl:    levothyroxine (SYNTHROID, LEVOTHROID) 75 MCG tablet, Take 75 mcg by mouth daily before breakfast., Disp: , Rfl:    losartan (COZAAR) 25 MG tablet, Take  1 tablet (25 mg total) by mouth daily., Disp: 30 tablet, Rfl: 6   LUMIGAN 0.01 % SOLN, Place 1 drop into both eyes at bedtime., Disp: , Rfl:    Multiple Vitamins-Minerals (ALIVE ONCE DAILY WOMENS 50+ PO), Take 1 tablet by mouth daily., Disp: , Rfl:    polyethylene glycol (MIRALAX / GLYCOLAX) 17 g packet, Take 17 g by mouth daily., Disp: 14 each, Rfl: 0   senna (SENOKOT) 8.6 MG TABS tablet, Take 1 tablet (8.6 mg total) by mouth 2 (two) times daily. For constipation., Disp: , Rfl: 0   torsemide (DEMADEX) 20 MG tablet, Take 40 mg by mouth daily., Disp: , Rfl:    Travoprost, BAK Free, (TRAVATAN) 0.004 % SOLN ophthalmic solution, Place 1 drop into both eyes at bedtime. , Disp: , Rfl:  Allergies  Allergen Reactions   Other Other (See Comments)    Blood Product Refusal (Jehovah's witness)     Tape Other (See Comments)    Leaves blisters and marks on the skin  Sulfa Antibiotics Rash   Uloric [Febuxostat] Rash     Social History   Socioeconomic History   Marital status: Widowed    Spouse name: Not on file   Number of children: 3   Years of education: Not on file   Highest education level: Some college, no degree  Occupational History   Occupation: retired  Tobacco Use   Smoking status: Former    Packs/day: 1.00    Years: 22.00    Pack years: 22.00    Types: Cigarettes    Quit date: 1983    Years since quitting: 40.4   Smokeless tobacco: Never  Vaping Use   Vaping Use: Never used  Substance and Sexual Activity   Alcohol use: Yes    Comment: 05/22/2018 "glass of wine couple times/wk"   Drug use: Not Currently   Sexual activity: Not Currently    Birth control/protection: None, Post-menopausal  Other Topics Concern   Not on file  Social History Narrative   Not on file   Social Determinants of Health   Financial Resource Strain: Not on file  Food Insecurity: Not on file  Transportation Needs: Not on file  Physical Activity: Not on file  Stress: Not on file  Social  Connections: Not on file  Intimate Partner Violence: Not on file    Physical Exam      Future Appointments  Date Time Provider [place]  10/31/2021  2:30 PM [facility] PA/NP [facility] None  11/01/2021  1:45 PM Benay Pike, MD [facility] None  11/15/2021  9:15 AM Rankin, Clent Demark, MD [facility] None  12/14/2021 11:00 AM [facility] DEVICE REMOTES [facility]  03/15/2022 11:00 AM [facility] DEVICE REMOTES [facility]  06/14/2022 11:00 AM [facility] DEVICE REMOTES [facility]     ACTION: Home visit completed

## 2021-10-27 DIAGNOSIS — D6869 Other thrombophilia: Secondary | ICD-10-CM | POA: Diagnosis not present

## 2021-10-27 DIAGNOSIS — I2510 Atherosclerotic heart disease of native coronary artery without angina pectoris: Secondary | ICD-10-CM | POA: Diagnosis not present

## 2021-10-27 DIAGNOSIS — E785 Hyperlipidemia, unspecified: Secondary | ICD-10-CM | POA: Diagnosis not present

## 2021-10-27 DIAGNOSIS — I110 Hypertensive heart disease with heart failure: Secondary | ICD-10-CM | POA: Diagnosis not present

## 2021-10-27 DIAGNOSIS — I509 Heart failure, unspecified: Secondary | ICD-10-CM | POA: Diagnosis not present

## 2021-10-27 DIAGNOSIS — E1122 Type 2 diabetes mellitus with diabetic chronic kidney disease: Secondary | ICD-10-CM | POA: Diagnosis not present

## 2021-10-27 DIAGNOSIS — I4891 Unspecified atrial fibrillation: Secondary | ICD-10-CM | POA: Diagnosis not present

## 2021-10-27 DIAGNOSIS — Z794 Long term (current) use of insulin: Secondary | ICD-10-CM | POA: Diagnosis not present

## 2021-10-27 DIAGNOSIS — E261 Secondary hyperaldosteronism: Secondary | ICD-10-CM | POA: Diagnosis not present

## 2021-10-27 DIAGNOSIS — I252 Old myocardial infarction: Secondary | ICD-10-CM | POA: Diagnosis not present

## 2021-10-27 DIAGNOSIS — E1142 Type 2 diabetes mellitus with diabetic polyneuropathy: Secondary | ICD-10-CM | POA: Diagnosis not present

## 2021-10-30 NOTE — Progress Notes (Signed)
Advanced Heart Failure Clinic Note   PCP: Seward Carol, MD EP: Dr. Curt Bears Rheumatology: Dr. Posey Rea HF Cardiologist: Dr. Haroldine Laws  HPI: Danielle [name] is a 76 y.o.female with a past medical history of systolic HF (EF 01-74%), CAD s/p multiple stents in [place] (with h/o LAD stent thrombosis), ischemic cardiomyopathy (EF 10% at one point), symptomatic bradycardia s/p PPM, HTN, DM, hypothyroidism, and OSA.   Admitted 9/18 with SOB, acute on chronic systolic CHF. Diuresed with IV lasix (12 pounds), discharge weight was 215 pounds.    Had R TKR 12/19.    Admitted 11/22 with right knee septic arthritis. Arthrocentesis culture grew staph aureus.  Underwent I&D and polyexchange of infected right total knee arthroplasty.  Treated with 6 weeks IV antibiotics followed by suppressive abx. Aldactone held, losartan reduced to 25 mg daily and coreg reduced to 12.5 mg BID d/t soft BP. She was given 1 dose of furosemide on admission due to leg edema but this was held later due to AKI.  Torsemide was resumed at discharge.  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  Patient underwent explant right total knee arthroplasty with implantation of articulating antibiotic spacer by Dr. Zachery Dakins on 07/12/2021. Placed on IV abx. Was discharged to SNF. During hospitalization was very anemic, but improving.   Echo 2/23 EF 40-45%   Follow up 4/23, unsure what meds she was taking. Re-enrolled in paramedicine.   Today she returns for HF follow up with paramedic, [name]. Overall feeling fine. No SOB taking her time walking with rolling walker. Limited mainly by knee pain. Has more LE swelling, tries to wear compression hose. Denies CP, dizziness, palpitations, or PND/Orthopnea. Appetite ok. No fever or chills. Weight at home 201 pounds. Taking all medications. Planning on knee replacement this summer.   Cardiac Studies: Echo (9/18): EF 45-50% Grade 2DD Left Atrium Severely  dilated.  Echo (8/19): EF 45-50%, G2DD, LA mildly dilated, trileaflet moderately thick aortic valve Echo (12/21): EF 45-50% G2DD  Echo (2/23): EF 40-45%   ROS: All systems negative except as listed in HPI, PMH and Problem List.  SH:  Social History   Socioeconomic History   Marital status: Widowed    Spouse name: Not on file   Number of children: 3   Years of education: Not on file   Highest education level: Some college, no degree  Occupational History   Occupation: retired  Tobacco Use   Smoking status: Former    Packs/day: 1.00    Years: 22.00    Pack years: 22.00    Types: Cigarettes    Quit date: 1983    Years since quitting: 40.4   Smokeless tobacco: Never  Vaping Use   Vaping Use: Never used  Substance and Sexual Activity   Alcohol use: Yes    Comment: 05/22/2018 "glass of wine couple times/wk"   Drug use: Not Currently   Sexual activity: Not Currently    Birth control/protection: None, Post-menopausal  Other Topics Concern   Not on file  Social History Narrative   Not on file   Social Determinants of Health   Financial Resource Strain: Not on file  Food Insecurity: Not on file  Transportation Needs: Not on file  Physical Activity: Not on file  Stress: Not on file  Social Connections: Not on file  Intimate Partner Violence: Not on file    FH:  Family History  Problem Relation Age of Onset   Multiple myeloma Mother    Heart disease  Father    Other Sister    Heart disease Brother    Diabetes Sister    Other Sister     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[place]) 02/05/2017   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 05/20/2018   Acute kidney failure ([place]) 05/15/2018   Arthritis    "all over" (05/22/2018)   Blood dyscrasia    per pt-has small blood cells-appears as if anemic   Breast cancer, left breast ([place]) 1985   CHF (congestive heart failure) ([facility])     Complication of anesthesia    difficult to awaken from per pt   Coronary artery disease    Dyspnea    Essential hypertension    Heart disease    Heart murmur    History of gout    Hypothyroidism    Obesity (BMI 30-39.9) 11/11/2017   OSA (obstructive sleep apnea) 02/18/2017    severe obstructive sleep apnea with an AHI of 75.6/h and mild central sleep apnea with a CAI of 7.7/h.  Oxygen saturations dropped as low as 82%.   He is on CPAP at 9 cm H2O.   OSA on CPAP    Personal history of chemotherapy    Personal history of radiation therapy    Presence of permanent cardiac pacemaker    Primary localized osteoarthritis of right knee 05/13/2018   Refusal of blood transfusions as patient is Jehovah's Witness    Type II diabetes mellitus ([place])    Current Outpatient Medications  Medication Sig Dispense Refill   acetaminophen (TYLENOL) 500 MG tablet Take 500-1,000 mg by mouth every 6 (six) hours as needed (for pain or headaches).     allopurinol (ZYLOPRIM) 100 MG tablet Take 100 mg by mouth daily.     apixaban (ELIQUIS) 5 MG TABS tablet Take 1 tablet (5 mg total) by mouth 2 (two) times daily. 60 tablet 11   atorvastatin (LIPITOR) 80 MG tablet Take 1 tablet (80 mg total) by mouth at bedtime. 90 tablet 3   Blood Glucose Monitoring Suppl (ONETOUCH VERIO) w/Device KIT Use to check blood sugar 1 kit 0   carvedilol (COREG) 3.125 MG tablet Take 1 tablet (3.125 mg total) by mouth 2 (two) times daily with a meal. 60 tablet 6   clotrimazole-betamethasone (LOTRISONE) lotion Apply 1 application topically 2 (two) times daily as needed (itching).     Continuous Blood Gluc Sensor (DEXCOM G6 SENSOR) MISC 1 Device by Does not apply route as directed. 9 each 3   Continuous Blood Gluc Transmit (DEXCOM G6 TRANSMITTER) MISC 1 Device by Does not apply route as directed. 1 each 3   empagliflozin (JARDIANCE) 10 MG TABS tablet Take 10 mg by mouth daily.     folic acid (FOLVITE) 1 MG tablet Take 1 tablet (1 mg total) by  mouth daily. 30 tablet 2   gabapentin (NEURONTIN) 300 MG capsule Take 300 mg by mouth 2 (two) times daily.     Insulin Glargine (BASAGLAR KWIKPEN) 100 UNIT/ML Inject 40 Units into the skin at bedtime.     insulin lispro (HUMALOG) 100 UNIT/ML injection Inject 0.05 mLs (5 Units total) into the skin 3 (three) times daily before meals. (Patient taking differently: Inject 5-15 Units into the skin 3 (three) times daily before meals. Per sliding scale) 10 mL 0   latanoprost (XALATAN) 0.005 % ophthalmic solution Place 1 drop into both eyes at bedtime.     levothyroxine (SYNTHROID,  LEVOTHROID) 75 MCG tablet Take 75 mcg by mouth daily before breakfast.     losartan (COZAAR) 25 MG tablet Take 1 tablet (25 mg total) by mouth daily. 30 tablet 6   Multiple Vitamins-Minerals (ALIVE ONCE DAILY WOMENS 50+ PO) Take 1 tablet by mouth daily.     polyethylene glycol (MIRALAX / GLYCOLAX) 17 g packet Take 17 g by mouth daily. 14 each 0   senna (SENOKOT) 8.6 MG TABS tablet Take 1 tablet (8.6 mg total) by mouth 2 (two) times daily. For constipation.  0   torsemide (DEMADEX) 20 MG tablet Take 40 mg by mouth daily.     Biotin 10 MG TABS Take 10 mg by mouth daily. (Patient not taking: Reported on 10/31/2021)     cefadroxil (DURICEF) 1 g tablet Take 1 tablet (1 g total) by mouth 2 (two) times daily. (Patient not taking: Reported on 10/26/2021) 180 tablet 0   Ferrous Sulfate (IRON) 325 (65 Fe) MG TABS Take 1 tablet (325 mg total) by mouth 2 (two) times daily before a meal.     LUMIGAN 0.01 % SOLN Place 1 drop into both eyes at bedtime. (Patient not taking: Reported on 10/31/2021)     Travoprost, BAK Free, (TRAVATAN) 0.004 % SOLN ophthalmic solution Place 1 drop into both eyes at bedtime.  (Patient not taking: Reported on 10/31/2021)     No current facility-administered medications for this encounter.   BP 120/62   Pulse 80   Wt 92.6 kg (204 lb 3.2 oz)   SpO2 96%   BMI 36.17 kg/m   Wt Readings from Last 3 Encounters:  10/31/21  92.6 kg (204 lb 3.2 oz)  10/26/21 91.2 kg (201 lb)  10/13/21 91.6 kg (202 lb)   PHYSICAL EXAM: General:  NAD. No resp difficulty, walked into clinic with RW HEENT: Normal Neck: Supple. No JVD. Carotids 2+ bilat; no bruits. No lymphadenopathy or thryomegaly appreciated. Cor: PMI nondisplaced. Regular rate & rhythm. No rubs, gallops, 2/6 SEM LUSB Lungs: Diminished in bases. Abdomen: Obese, soft, nontender, nondistended. No hepatosplenomegaly. No bruits or masses. Good bowel sounds. Extremities: No cyanosis, clubbing, rash, 1+ BLE edema; chronic LUE lymphedema Neuro: Alert & oriented x 3, cranial nerves grossly intact. Moves all 4 extremities w/o difficulty. Affect pleasant.  ECG (personally reviewed): NSR 74 bpm  Device interrogation (personally reviewed): < 1% v-pacing, no AF, 1.2 hr/day activity.   ASSESSMENT & PLAN: 1. Chronic systolic CHF: ICM - Echo (12/21): EF 45-50% G2DD  - Echo (2/23): EF 40-45%  - Stable NYHA II. Volume looks ok.  - Needs to wear compression hose. - Restart spiro 12.5 mg daily. - Continue torsemide 40 mg daily.  - Continue losartan 25 mg daily.  - Continue carvedilol 3.125 mg bid. - Continue Jardiance 10 mg daily. - BMET today, repeat in 1 week.  2. CAD:  - Stenting in 2010, 2012 and 2016.  - No s/s ischemia. - Continue statin.  - No ASA with Eliquis.   3. Paroxysmal Afib - NSR on ECG today. - Continue Eliquis 5 mg bid. No bleeding. - Hgb 10.6 on labs 10/13/21. (She is Jehovah's Witness)   4. HTN - BP stable. - Continue current medications.    5. DM - Per Endocrine. On insulin. - Continue Jardiance.    6. H/o sinus pauses s/p PPM - Dr. Curt Bears now following.  - No change.    7. OSA - Continue CPAP nightly.  - Follows with Dr. Radford Pax.   8. CKD 3b -  Baseline SCr ~ 1.4. - Continue Jardiance. - Labs today.   9. Right knee PJI  - SP I&D with poly exchange on 11/18 , Cx+ MSSA - Now with spacer.  - Abx recently stopped per ID. Planning  repeat sampling of knee for planning of knee replacement.  Follow up in 4-6 weeks with APP (switch losartan to Entresto if able) and 12 weeks with Dr. Haroldine Laws  Rafael Bihari, FNP  2:27 PM

## 2021-10-31 ENCOUNTER — Telehealth (HOSPITAL_COMMUNITY): Payer: Self-pay

## 2021-10-31 ENCOUNTER — Encounter (HOSPITAL_COMMUNITY): Payer: Self-pay

## 2021-10-31 ENCOUNTER — Ambulatory Visit (HOSPITAL_COMMUNITY)
Admission: RE | Admit: 2021-10-31 | Discharge: 2021-10-31 | Disposition: A | Discharge status: Home / Self Care | Payer: Medicare HMO | Source: Ambulatory Visit | Attending: Family Medicine | Admitting: Family Medicine

## 2021-10-31 ENCOUNTER — Other Ambulatory Visit (HOSPITAL_COMMUNITY): Payer: Self-pay

## 2021-10-31 ENCOUNTER — Telehealth (HOSPITAL_COMMUNITY): Payer: Self-pay | Admitting: Cardiology

## 2021-10-31 VITALS — BP 120/62 | HR 80 | Wt 204.2 lb

## 2021-10-31 DIAGNOSIS — Z9221 Personal history of antineoplastic chemotherapy: Secondary | ICD-10-CM | POA: Insufficient documentation

## 2021-10-31 DIAGNOSIS — E119 Type 2 diabetes mellitus without complications: Secondary | ICD-10-CM | POA: Diagnosis not present

## 2021-10-31 DIAGNOSIS — I480 Paroxysmal atrial fibrillation: Secondary | ICD-10-CM

## 2021-10-31 DIAGNOSIS — I10 Essential (primary) hypertension: Secondary | ICD-10-CM | POA: Diagnosis not present

## 2021-10-31 DIAGNOSIS — Z955 Presence of coronary angioplasty implant and graft: Secondary | ICD-10-CM | POA: Insufficient documentation

## 2021-10-31 DIAGNOSIS — Z950 Presence of cardiac pacemaker: Secondary | ICD-10-CM | POA: Diagnosis not present

## 2021-10-31 DIAGNOSIS — Z87891 Personal history of nicotine dependence: Secondary | ICD-10-CM | POA: Insufficient documentation

## 2021-10-31 DIAGNOSIS — Y838 Other surgical procedures as the cause of abnormal reaction of the patient, or of later complication, without mention of misadventure at the time of the procedure: Secondary | ICD-10-CM | POA: Diagnosis not present

## 2021-10-31 DIAGNOSIS — E039 Hypothyroidism, unspecified: Secondary | ICD-10-CM | POA: Diagnosis not present

## 2021-10-31 DIAGNOSIS — G4733 Obstructive sleep apnea (adult) (pediatric): Secondary | ICD-10-CM | POA: Diagnosis not present

## 2021-10-31 DIAGNOSIS — I5022 Chronic systolic (congestive) heart failure: Secondary | ICD-10-CM

## 2021-10-31 DIAGNOSIS — I2510 Atherosclerotic heart disease of native coronary artery without angina pectoris: Secondary | ICD-10-CM | POA: Diagnosis not present

## 2021-10-31 DIAGNOSIS — I130 Hypertensive heart and chronic kidney disease with heart failure and stage 1 through stage 4 chronic kidney disease, or unspecified chronic kidney disease: Secondary | ICD-10-CM | POA: Insufficient documentation

## 2021-10-31 DIAGNOSIS — T8450XS Infection and inflammatory reaction due to unspecified internal joint prosthesis, sequela: Secondary | ICD-10-CM | POA: Diagnosis not present

## 2021-10-31 DIAGNOSIS — I255 Ischemic cardiomyopathy: Secondary | ICD-10-CM | POA: Diagnosis not present

## 2021-10-31 DIAGNOSIS — Z923 Personal history of irradiation: Secondary | ICD-10-CM | POA: Insufficient documentation

## 2021-10-31 DIAGNOSIS — T8453XS Infection and inflammatory reaction due to internal right knee prosthesis, sequela: Secondary | ICD-10-CM | POA: Insufficient documentation

## 2021-10-31 DIAGNOSIS — E1122 Type 2 diabetes mellitus with diabetic chronic kidney disease: Secondary | ICD-10-CM | POA: Diagnosis not present

## 2021-10-31 DIAGNOSIS — Z7901 Long term (current) use of anticoagulants: Secondary | ICD-10-CM | POA: Diagnosis not present

## 2021-10-31 DIAGNOSIS — N1832 Chronic kidney disease, stage 3b: Secondary | ICD-10-CM | POA: Diagnosis not present

## 2021-10-31 DIAGNOSIS — I495 Sick sinus syndrome: Secondary | ICD-10-CM

## 2021-10-31 DIAGNOSIS — Z79899 Other long term (current) drug therapy: Secondary | ICD-10-CM | POA: Insufficient documentation

## 2021-10-31 DIAGNOSIS — Z9989 Dependence on other enabling machines and devices: Secondary | ICD-10-CM | POA: Insufficient documentation

## 2021-10-31 DIAGNOSIS — Z853 Personal history of malignant neoplasm of breast: Secondary | ICD-10-CM | POA: Insufficient documentation

## 2021-10-31 LAB — BASIC METABOLIC PANEL
Anion gap: 9 (ref 5–15)
BUN: 33 mg/dL — ABNORMAL HIGH (ref 8–23)
CO2: 27 mmol/L (ref 22–32)
Calcium: 9.3 mg/dL (ref 8.9–10.3)
Chloride: 102 mmol/L (ref 98–111)
Creatinine, Ser: 1.39 mg/dL — ABNORMAL HIGH (ref 0.44–1.00)
GFR, Estimated: 40 mL/min — ABNORMAL LOW (ref >60)
Glucose, Bld: 111 mg/dL — ABNORMAL HIGH (ref 70–99)
Potassium: 4.1 mmol/L (ref 3.5–5.1)
Sodium: 138 mmol/L (ref 135–145)

## 2021-10-31 LAB — BRAIN NATRIURETIC PEPTIDE: B Natriuretic Peptide: 104.7 pg/mL — ABNORMAL HIGH (ref 0.0–100.0)

## 2021-10-31 MED ORDER — SPIRONOLACTONE 25 MG PO TABS: 12.5000 mg | ORAL_TABLET | Freq: Every day | ORAL | 0 refills | Status: DC

## 2021-10-31 MED ORDER — TORSEMIDE 20 MG PO TABS: 20.0000 mg | ORAL_TABLET | Freq: Every day | ORAL | 11 refills | Status: DC

## 2021-10-31 NOTE — Telephone Encounter (Signed)
Patient called.  Patient aware.  

## 2021-10-31 NOTE — Progress Notes (Signed)
Paramedicine Encounter    Patient ID: Danielle [name], female    DOB: 1945/08/04, 76 y.o.   MRN: [id number]  Met with [name] in clinic today where she was seen by Allena Katz NP. She had some lower leg edema noted. Weight today is 204lbs.   [name] restarted Spironolactone 12.56m daily today.   She will need labs in one week.   She will be seen by [name] Thurs June 8th at 1045.   EKG and Labs done today.   Med changed after lab results--- decrease Torsemide to 253mdaily. I notified [name] she took out the second Torsemide for correct dose.   Clinic and phone visit complete I will see her in one week in the home.   Refills: Losartan  Lantus (will send phone message to Endo)    ACTION: Home visit completed

## 2021-10-31 NOTE — Telephone Encounter (Signed)
-----   Message from Rafael Bihari, [place] sent at 10/31/2021  4:39 PM EDT ----- Decrease torsemide to 20 mg daily with addition of spiro today.  She has repeat labs arranged. I will CC Heather with paramedine

## 2021-10-31 NOTE — Patient Instructions (Addendum)
Thank you for coming in today  Labs were done today, if any labs are abnormal the clinic will call you No news is good news  RESTART Spironolactone 12.5 mg 1/2 tablet daily   Your physician recommends that you schedule a follow-up appointment in:  4-6 weeks in clinic  12-16 weeks with Dr. Haroldine Laws  You have been given a prescription for slippery gator set    Do the following things EVERYDAY: Weigh yourself in the morning before breakfast. Write it down and keep it in a log. Take your medicines as prescribed Eat low salt foods--Limit salt (sodium) to 2000 mg per day.  Stay as active as you can everyday Limit all fluids for the day to less than 2 liters  At the [facility], you and your health needs are our priority. As part of our continuing mission to provide you with exceptional heart care, we have created designated Provider Care Teams. These Care Teams include your primary Cardiologist (physician) and Advanced Practice Providers (APPs- Physician Assistants and Nurse Practitioners) who all work together to provide you with the care you need, when you need it.   You may see any of the following providers on your designated Care Team at your next follow up: Dr Glori Bickers Dr Haynes Kerns, NP Lyda Jester, [facility] [place], [place] Audry Riles, PharmD   Please be sure to bring in all your medications bottles to every appointment.   If you have any questions or concerns before your next appointment please send [place] a message through [place] or call our office at [phone].    TO LEAVE A MESSAGE FOR THE NURSE SELECT OPTION 2, PLEASE LEAVE A MESSAGE INCLUDING: YOUR NAME DATE OF BIRTH CALL BACK NUMBER REASON FOR CALL**this is important as we prioritize the call backs  YOU WILL RECEIVE A CALL BACK THE SAME DAY AS LONG AS YOU CALL BEFORE 4:00 PM

## 2021-10-31 NOTE — Telephone Encounter (Signed)
Received call from [facility] reporting they are needing refills for Danielle [name]'s insulin sent in to their pharmacy so they can refill same. Currently she has one pen of Lantus at home where she has been taking 45 units at bedtime. (This was left over from her discharge from SNF)   Her prescription in her chart is for Basaglar 40 units. Basaglar refill needs to be sent to [facility] as this is the preferred insulin for her insurance and cheaper for the patient. I will assist the patient in setting up a follow up at [facility] at our next home visit.   Forwarded to Endocrinologist for follow up.

## 2021-11-01 ENCOUNTER — Inpatient Hospital Stay: Payer: Medicare HMO

## 2021-11-01 ENCOUNTER — Ambulatory Visit (INDEPENDENT_AMBULATORY_CARE_PROVIDER_SITE_OTHER): Payer: Medicare HMO

## 2021-11-01 ENCOUNTER — Inpatient Hospital Stay: Payer: Medicare HMO | Attending: Hematology and Oncology | Admitting: Hematology and Oncology

## 2021-11-01 ENCOUNTER — Encounter: Payer: Self-pay | Admitting: Hematology and Oncology

## 2021-11-01 ENCOUNTER — Other Ambulatory Visit: Payer: Self-pay

## 2021-11-01 VITALS — BP 158/69 | HR 82 | Temp 97.7°F | Resp 19 | Ht 63.0 in | Wt 205.9 lb

## 2021-11-01 DIAGNOSIS — I495 Sick sinus syndrome: Secondary | ICD-10-CM | POA: Diagnosis not present

## 2021-11-01 DIAGNOSIS — D509 Iron deficiency anemia, unspecified: Secondary | ICD-10-CM

## 2021-11-01 DIAGNOSIS — Z87891 Personal history of nicotine dependence: Secondary | ICD-10-CM | POA: Insufficient documentation

## 2021-11-01 LAB — CBC WITH DIFFERENTIAL/PLATELET
Abs Immature Granulocytes: 0.01 10*3/uL (ref 0.00–0.07)
Basophils Absolute: 0 10*3/uL (ref 0.0–0.1)
Basophils Relative: 1 %
Eosinophils Absolute: 0.1 10*3/uL (ref 0.0–0.5)
Eosinophils Relative: 1 %
HCT: 35.1 % — ABNORMAL LOW (ref 36.0–46.0)
Hemoglobin: 10.9 g/dL — ABNORMAL LOW (ref 12.0–15.0)
Immature Granulocytes: 0 %
Lymphocytes Relative: 37 %
Lymphs Abs: 2.8 10*3/uL (ref 0.7–4.0)
MCH: 21.3 pg — ABNORMAL LOW (ref 26.0–34.0)
MCHC: 31.1 g/dL (ref 30.0–36.0)
MCV: 68.7 fL — ABNORMAL LOW (ref 80.0–100.0)
Monocytes Absolute: 0.5 10*3/uL (ref 0.1–1.0)
Monocytes Relative: 6 %
Neutro Abs: 4.1 10*3/uL (ref 1.7–7.7)
Neutrophils Relative %: 55 %
Platelets: 202 10*3/uL (ref 150–400)
RBC: 5.11 MIL/uL (ref 3.87–5.11)
RDW: 17 % — ABNORMAL HIGH (ref 11.5–15.5)
WBC: 7.5 10*3/uL (ref 4.0–10.5)
nRBC: 0 % (ref 0.0–0.2)

## 2021-11-01 NOTE — Progress Notes (Signed)
[name]  Patient Care Team: Seward Carol, MD as PCP - General (Internal Medicine) Constance Haw, MD as PCP - Electrophysiology (Cardiology) Sueanne Margarita, MD as PCP - Sleep Medicine (Cardiology) Bensimhon, Shaune Pascal, MD as PCP - Cardiology (Cardiology) Jorge Ny, LCSW as Social Worker (Licensed Clinical Social Worker) Shamleffer, Melanie Crazier, MD as Consulting Physician (Endocrinology)  CHIEF COMPLAINTS/PURPOSE OF CONSULTATION:  Microcytic hypochromic anemia.  ASSESSMENT & PLAN:   This is a pleasant 76 year old female patient with multiple medical comorbidities, history of chronic microcytic hypochromic anemia referred to hematology for recommendations regarding anemia since she is anticipating another surgical procedure and she is in she is Jehovah's Witness.   During her last visit, given hemoglobin over 10 g/dl, we did not recommend any additional supplementation.  She is here for follow-up.  She has seen infectious diseases and cardiology team since last visit.  She will be seeing her orthopedics team tomorrow, may need another arthrocentesis to make sure the infection has cleared completely before Having a knee replacement. We have reviewed labs from last visit today.  She does not have any evidence of iron deficiency or B12 deficiency.  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  This should not affect her surgical decision.  If her if her CBC from today shows hemoglobin greater than 10 g/dL, we should be able to move forward with surgery from our standpoint.  She can return to clinic in about 3 months.  HISTORY OF PRESENTING ILLNESS:   Danielle [name] 76 y.o. female is here because of microcytic hypochromic anemia.  This is a very pleasant 76 year old female patient with past medical history significant for congestive heart failure, hypertension, coronary artery disease, obesity, obstructive sleep apnea,  personal history of left-sided breast cancer 30 years ago who had right prosthetic knee infection, multiple procedures, antibiotics referred to hematology for correction of anemia since she is anticipating surgery again and since she is Jehovah witness. She denies any major complaints today except she is frustrated about having to go through these procedures again and again and since her quality of life is very impaired.  She does remember seeing a hematologist many years ago and was told that she has microcytic anemia without any clear etiology and her baseline hemoglobin runs around 10 g She needed blood transfusion during this last procedure since her hemoglobin was right under 5 and she was thought to be in a critical state and her daughter agreed to give her blood.    Interval History  She is here for a follow up by herself. She is following orthopedics tomorrow hoping for knee replacement soon She feels well, less tired, no SOB, no blood loss reported anywhere She has been taking iron once a day and folic acid daily She is not looking forward to the knee tap but understands that this may be needed before her knee replacement. Rest of the pertinent 10 point ROS reviewed and negative  MEDICAL HISTORY:  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[place]) 02/05/2017   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 05/20/2018   Acute kidney failure ([place]) 05/15/2018   Arthritis    "all over" (05/22/2018)   Blood dyscrasia    per pt-has small blood cells-appears as if anemic   Breast cancer, left breast ([place]) 1985   CHF (congestive heart failure) ([place])    Complication of anesthesia  difficult to awaken from per pt   Coronary artery disease    Dyspnea    Essential hypertension    Heart disease    Heart murmur    History of gout    Hypothyroidism    Obesity (BMI 30-39.9) 11/11/2017   OSA (obstructive sleep apnea)  02/18/2017    severe obstructive sleep apnea with an AHI of 75.6/h and mild central sleep apnea with a CAI of 7.7/h.  Oxygen saturations dropped as low as 82%.   He is on CPAP at 9 cm H2O.   OSA on CPAP    Personal history of chemotherapy    Personal history of radiation therapy    Presence of permanent cardiac pacemaker    Primary localized osteoarthritis of right knee 05/13/2018   Refusal of blood transfusions as patient is Jehovah's Witness    Type II diabetes mellitus ([place])     SURGICAL HISTORY: Past Surgical History:  Procedure Laterality Date   APPLICATION OF WOUND VAC Right 04/14/2021   Procedure: APPLICATION OF WOUND VAC;  Surgeon: Willaim Sheng, MD;  Location: [facility];  Service: Orthopedics;  Laterality: Right;   BREAST BIOPSY Left 1985   BREAST LUMPECTOMY Left 1985   BREAST LUMPECTOMY WITH NEEDLE LOCALIZATION AND AXILLARY LYMPH NODE DISSECTION  1985   CATARACT EXTRACTION W/ INTRAOCULAR LENS  IMPLANT, BILATERAL Bilateral    CORONARY ANGIOPLASTY WITH STENT PLACEMENT  2010, 2012,2017    2 done 2010 and 1 replaced 2012 and 2 replaced in 2017   [place] [facility]     EXCISIONAL TOTAL KNEE ARTHROPLASTY WITH ANTIBIOTIC SPACERS Right 07/12/2021   Procedure: EXCISIONAL TOTAL KNEE ARTHROPLASTY WITH ANTIBIOTIC SPACERS;  Surgeon: Willaim Sheng, MD;  Location: [facility];  Service: Orthopedics;  Laterality: Right;   I & D KNEE WITH POLY EXCHANGE Right 04/14/2021   Procedure: IRRIGATION AND DEBRIDEMENT KNEE WITH POLY EXCHANGE;  Surgeon: Willaim Sheng, MD;  Location: [facility];  Service: Orthopedics;  Laterality: Right;   INSERT / REPLACE / REMOVE PACEMAKER  07/19/2014   JOINT REPLACEMENT     REPLACEMENT TOTAL KNEE Left 09/2012   THYROIDECTOMY, PARTIAL  1978   TONSILLECTOMY     TOTAL KNEE ARTHROPLASTY Right 05/13/2018   Procedure: TOTAL KNEE ARTHROPLASTY;  Surgeon: Marchia Bond, MD;  Location: [facility];  Service: Orthopedics;  Laterality: Right;   TUBAL LIGATION       SOCIAL HISTORY: Social History   Socioeconomic History   Marital status: Widowed    Spouse name: Not on file   Number of children: 3   Years of education: Not on file   Highest education level: Some college, no degree  Occupational History   Occupation: retired  Tobacco Use   Smoking status: Former    Packs/day: 1.00    Years: 22.00    Pack years: 22.00    Types: Cigarettes    Quit date: 1983    Years since quitting: 40.4   Smokeless tobacco: Never  Vaping Use   Vaping Use: Never used  Substance and Sexual Activity   Alcohol use: Yes    Comment: 05/22/2018 "glass of wine couple times/wk"   Drug use: Not Currently   Sexual activity: Not Currently    Birth control/protection: None, Post-menopausal  Other Topics Concern   Not on file  Social History Narrative   Not on file   Social Determinants of Health   Financial Resource Strain: Not on file  Food Insecurity: Not on file  Transportation  Needs: Not on file  Physical Activity: Not on file  Stress: Not on file  Social Connections: Not on file  Intimate Partner Violence: Not on file    FAMILY HISTORY: Family History  Problem Relation Age of Onset   Multiple myeloma Mother    Heart disease Father    Other Sister    Heart disease Brother    Diabetes Sister    Other Sister     ALLERGIES:  is allergic to other, tape, sulfa antibiotics, and uloric [febuxostat].  MEDICATIONS:  Current Outpatient Medications  Medication Sig Dispense Refill   acetaminophen (TYLENOL) 500 MG tablet Take 500-1,000 mg by mouth every 6 (six) hours as needed (for pain or headaches).     allopurinol (ZYLOPRIM) 100 MG tablet Take 100 mg by mouth daily.     apixaban (ELIQUIS) 5 MG TABS tablet Take 1 tablet (5 mg total) by mouth 2 (two) times daily. 60 tablet 11   atorvastatin (LIPITOR) 80 MG tablet Take 1 tablet (80 mg total) by mouth at bedtime. 90 tablet 3   Biotin 10 MG TABS Take 10 mg by mouth daily.     Blood Glucose Monitoring  Suppl (ONETOUCH VERIO) w/Device KIT Use to check blood sugar 1 kit 0   carvedilol (COREG) 3.125 MG tablet Take 1 tablet (3.125 mg total) by mouth 2 (two) times daily with a meal. 60 tablet 6   cefadroxil (DURICEF) 1 g tablet Take 1 tablet (1 g total) by mouth 2 (two) times daily. (Patient not taking: Reported on 10/26/2021) 180 tablet 0   clotrimazole-betamethasone (LOTRISONE) lotion Apply 1 application topically 2 (two) times daily as needed (itching).     Continuous Blood Gluc Sensor (DEXCOM G6 SENSOR) MISC 1 Device by Does not apply route as directed. 9 each 3   Continuous Blood Gluc Transmit (DEXCOM G6 TRANSMITTER) MISC 1 Device by Does not apply route as directed. 1 each 3   empagliflozin (JARDIANCE) 10 MG TABS tablet Take 10 mg by mouth daily.     Ferrous Sulfate (IRON) 325 (65 Fe) MG TABS Take 1 tablet (325 mg total) by mouth 2 (two) times daily before a meal.     folic acid (FOLVITE) 1 MG tablet Take 1 tablet (1 mg total) by mouth daily. 30 tablet 2   gabapentin (NEURONTIN) 300 MG capsule Take 300 mg by mouth 2 (two) times daily.     Insulin Glargine (BASAGLAR KWIKPEN) 100 UNIT/ML Inject 40 Units into the skin at bedtime.     insulin lispro (HUMALOG) 100 UNIT/ML injection Inject 0.05 mLs (5 Units total) into the skin 3 (three) times daily before meals. (Patient taking differently: Inject 5-15 Units into the skin 3 (three) times daily before meals. Per sliding scale) 10 mL 0   latanoprost (XALATAN) 0.005 % ophthalmic solution Place 1 drop into both eyes at bedtime.     levothyroxine (SYNTHROID, LEVOTHROID) 75 MCG tablet Take 75 mcg by mouth daily before breakfast.     losartan (COZAAR) 25 MG tablet Take 1 tablet (25 mg total) by mouth daily. 30 tablet 6   LUMIGAN 0.01 % SOLN Place 1 drop into both eyes at bedtime.     Multiple Vitamins-Minerals (ALIVE ONCE DAILY WOMENS 50+ PO) Take 1 tablet by mouth daily.     polyethylene glycol (MIRALAX / GLYCOLAX) 17 g packet Take 17 g by mouth daily.  (Patient not taking: Reported on 10/31/2021) 14 each 0   senna (SENOKOT) 8.6 MG TABS tablet Take 1 tablet (8.6  mg total) by mouth 2 (two) times daily. For constipation. (Patient not taking: Reported on 10/31/2021)  0   spironolactone (ALDACTONE) 25 MG tablet Take 0.5 tablets (12.5 mg total) by mouth daily. 90 tablet 0   torsemide (DEMADEX) 20 MG tablet Take 1 tablet (20 mg total) by mouth daily. 30 tablet 11   Travoprost, BAK Free, (TRAVATAN) 0.004 % SOLN ophthalmic solution Place 1 drop into both eyes at bedtime.     No current facility-administered medications for this visit.     PHYSICAL EXAMINATION: ECOG PERFORMANCE STATUS: 0 - Asymptomatic  There were no vitals filed for this visit.  There were no vitals filed for this visit.   Physical Exam Constitutional:      Appearance: Normal appearance.  Cardiovascular:     Rate and Rhythm: Normal rate and regular rhythm.     Pulses: Normal pulses.     Heart sounds: Normal heart sounds.  Musculoskeletal:        General: Swelling (Left upper extremity lymphedema.  Bilateral lower extremity swelling noted right greater than left) present.     Cervical back: Normal range of motion and neck supple. No rigidity.  Lymphadenopathy:     Cervical: No cervical adenopathy.  Neurological:     General: No focal deficit present.     Mental Status: She is alert.     LABORATORY DATA:  I have reviewed the data as listed Lab Results  Component Value Date   WBC 7.4 10/13/2021   HGB 10.6 (L) 10/13/2021   HCT 34.0 (L) 10/13/2021   MCV 69.4 (L) 10/13/2021   PLT 220 10/13/2021     Chemistry      Component Value Date/Time   NA 138 10/31/2021 1503   K 4.1 10/31/2021 1503   CL 102 10/31/2021 1503   CO2 27 10/31/2021 1503   BUN 33 (H) 10/31/2021 1503   CREATININE 1.39 (H) 10/31/2021 1503   CREATININE 1.04 (H) 09/21/2021 1211      Component Value Date/Time   CALCIUM 9.3 10/31/2021 1503   ALKPHOS 70 07/10/2021 1404   AST 19 09/21/2021 1211    ALT 13 09/21/2021 1211   BILITOT 0.6 09/21/2021 1211       RADIOGRAPHIC STUDIES: I have personally reviewed the radiological images as listed and agreed with the findings in the report. Intravitreal Injection, Pharmacologic Agent - OS - Left Eye  Result Date: 10/04/2021 Time Out 10/04/2021. 10:12 AM. Confirmed correct patient, procedure, site, and patient consented. Anesthesia Topical anesthesia was used. Anesthetic medications included Lidocaine 4%. Procedure Preparation included Tobramycin 0.3%, 10% betadine to eyelids, 5% betadine to ocular surface. A 30 gauge needle was used. Injection: 2.5 mg bevacizumab 2.5 MG/0.1ML   Route: Intravitreal, Site: Left Eye   NDC: [phone], Lot: [id number] Post-op Post injection exam found visual acuity of at least counting fingers. The patient tolerated the procedure well. There were no complications. The patient received written and verbal post procedure care education. Post injection medications included ocuflox.   OCT, Retina - OU - Both Eyes  Result Date: 10/04/2021 Right Eye Quality was good. Scan locations included subfoveal. Central Foveal Thickness: 273. Progression has been stable. Findings include normal foveal contour. Left Eye Quality was good. Scan locations included subfoveal. Central Foveal Thickness: 293. Progression has improved. Findings include cystoid macular edema. Notes CSME, with protected fovea, not center involved, temporally OS.  Will need AVASTIN OS,, now, and focal laser OS SOON  Color Fundus Photography Optos - OU - Both Eyes  Result Date: 10/04/2021 Right Eye Progression has been stable. Disc findings include normal observations. Macula : microaneurysms. Left Eye Progression has improved. Disc findings include normal observations. Macula : edema, exudates, microaneurysms. Notes Bilateral severe NPDR good focal laser treatment right eye and left eye OS with persistent retinal exudates temporal to FAZ threatening vision.  CSME.  We  will deliver antivegF OS today follow-up and repeat next in 5 to 6 weeks and then likely focal thereafter   All questions were answered. The patient knows to call the clinic with any problems, questions or concerns. I spent 20 minutes in the care of this patient including H and P, review of records, counseling and coordination of care.     Benay Pike, MD 11/01/2021 12:58 PM

## 2021-11-02 ENCOUNTER — Other Ambulatory Visit: Payer: Self-pay | Admitting: Internal Medicine

## 2021-11-02 ENCOUNTER — Telehealth: Payer: Self-pay | Admitting: Hematology and Oncology

## 2021-11-02 MED ORDER — BASAGLAR KWIKPEN 100 UNIT/ML ~~LOC~~ SOPN: 45.0000 [IU] | PEN_INJECTOR | Freq: Every day | SUBCUTANEOUS | 0 refills | Status: DC

## 2021-11-02 NOTE — Telephone Encounter (Signed)
Patient notified and states that she will contact us back to schedule appointment as she already has a lot of appointments scheduled to get ready for surgery. Patient is aware that she needs to keep her follow up appointment in order for continued care.

## 2021-11-02 NOTE — Telephone Encounter (Signed)
Scheduled appointment per 6/7 los. Patient is aware. 

## 2021-11-03 LAB — CUP PACEART REMOTE DEVICE CHECK
Battery Remaining Longevity: 49 mo
Battery Voltage: 2.98 V
Brady Statistic AP VP Percent: 0.01 %
Brady Statistic AP VS Percent: 1.53 %
Brady Statistic AS VP Percent: 0.05 %
Brady Statistic AS VS Percent: 98.42 %
Brady Statistic RA Percent Paced: 1.53 %
Brady Statistic RV Percent Paced: 0.05 %
Date Time Interrogation Session: 20230606145519
Implantable Lead Implant Date: 20160222
Implantable Lead Implant Date: 20160222
Implantable Lead Location: 753859
Implantable Lead Location: 753860
Implantable Lead Model: 5076
Implantable Lead Model: 5076
Implantable Pulse Generator Implant Date: 20160222
Lead Channel Impedance Value: 361 Ohm
Lead Channel Impedance Value: 380 Ohm
Lead Channel Impedance Value: 418 Ohm
Lead Channel Impedance Value: 456 Ohm
Lead Channel Pacing Threshold Amplitude: 0.75 V
Lead Channel Pacing Threshold Amplitude: 0.75 V
Lead Channel Pacing Threshold Pulse Width: 0.4 ms
Lead Channel Pacing Threshold Pulse Width: 0.4 ms
Lead Channel Sensing Intrinsic Amplitude: 15.75 mV
Lead Channel Sensing Intrinsic Amplitude: 15.75 mV
Lead Channel Sensing Intrinsic Amplitude: 4.125 mV
Lead Channel Sensing Intrinsic Amplitude: 4.125 mV
Lead Channel Setting Pacing Amplitude: 2 V
Lead Channel Setting Pacing Amplitude: 2 V
Lead Channel Setting Pacing Pulse Width: 0.4 ms
Lead Channel Setting Sensing Sensitivity: 2 mV

## 2021-11-06 ENCOUNTER — Other Ambulatory Visit (HOSPITAL_COMMUNITY): Payer: Self-pay

## 2021-11-06 NOTE — Progress Notes (Signed)
Paramedicine Encounter    Patient ID: Danielle [name], female    DOB: Jun 07, 1945, 76 y.o.   MRN: [id number]   Arrived for home visit for [place] who reports she is feeling okay just having some arthritis pain. She denied shortness of breath, chest pain or dizziness. She has been compliant with her meds over the last week. She was seen by hematology last week and they report her hemoglobin is better and she can stop her Iron supplement. I advised [place] of same. Vitals and assessment obtained. Aryia reports her weight is down this week and her swelling has decreased. She says she was peeing a lot last week and is maintaining well with urine output over the weekend after the Torsemide decrease. Edema present but decreased. Lungs clear.   CBG on dexcom- 154 Dexcom replaced today.   I reviewed medications and confirmed same filling pill box for one week. Pill box needs Losartan every evening except tonight. She plans to pick up refills today.   We discussed upcoming appointments and confirmed same. She sees ortho tomorrow morning and labs at HF clinic tomorrow afternoon. I advised her if they made any changes I could assist with same. She agreed. Home visit complete. I will see Rob[place] in one week.   Refills- ready at [facility] for pickup: -Basaglar -Folic Acid -[place], [place] 11/06/2021   Patient Care Team: Seward Carol, MD as PCP - General (Internal Medicine) Constance Haw, MD as PCP - Electrophysiology (Cardiology) Sueanne Margarita, MD as PCP - Sleep Medicine (Cardiology) Bensimhon, Shaune Pascal, MD as PCP - Cardiology (Cardiology) Jorge Ny, LCSW as Social Worker (Licensed Clinical Social Worker) Shamleffer, Melanie Crazier, MD as Consulting Physician (Endocrinology)  Patient Active Problem List   Diagnosis Date Noted   DVT (deep venous thrombosis) ([place]) 07/31/2021   Acute blood loss anemia    Septic Arthritis, Infection of  prosthetic right knee joint ([place]), MSSA infection 48/18/5631   Complication of internal right knee prosthesis ([place]) 07/10/2021   CAD (coronary artery disease) 06/08/2021   Carotid artery disease ([place]) 06/08/2021   Symptomatic bradycardia 06/08/2021   Paroxysmal atrial fibrillation ([place]) 06/08/2021   CKD (chronic kidney disease), stage III ([place]) 06/08/2021   Polyneuropathy associated with underlying disease ([place]) 05/04/2020   Severe nonproliferative diabetic retinopathy of left eye, with macular edema, associated with type 2 diabetes mellitus ([place]) 11/05/2019   Severe nonproliferative diabetic retinopathy of right eye, with macular edema, associated with type 2 diabetes mellitus ([place]) 11/05/2019   Retinal hemorrhage of right eye 11/05/2019   Retinal hemorrhage of left eye 11/05/2019   Diabetes mellitus ([place]) 05/14/2019   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, with long-term current use of insulin ([place]) 05/14/2019   Type 2 diabetes mellitus with diabetic polyneuropathy, with long-term current use of insulin ([place]) 05/14/2019   History of gout 05/20/2018   Primary localized osteoarthritis of right knee 05/13/2018   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) 12/20/2017   Obesity (BMI 30-39.9) 11/11/2017   OSA (obstructive sleep apnea) 02/18/2017   Essential hypertension    L Breast Cancer    Hypothyroidism     Current Outpatient Medications:    acetaminophen (TYLENOL) 500 MG tablet, Take 500-1,000 mg by mouth every 6 (six) hours as needed (for pain or headaches)., Disp: , Rfl:    allopurinol (ZYLOPRIM) 100 MG tablet, Take 100 mg by mouth daily., Disp: , Rfl:    apixaban (ELIQUIS) 5 MG TABS tablet, Take  1 tablet (5 mg total) by mouth 2 (two) times daily., Disp: 60 tablet, Rfl: 11   atorvastatin (LIPITOR) 80 MG tablet, Take 1 tablet (80 mg total) by mouth at bedtime., Disp: 90 tablet, Rfl: 3   Biotin 10 MG TABS, Take 10 mg by mouth daily., Disp: , Rfl:    Blood Glucose  Monitoring Suppl (ONETOUCH VERIO) w/Device KIT, Use to check blood sugar, Disp: 1 kit, Rfl: 0   carvedilol (COREG) 3.125 MG tablet, Take 1 tablet (3.125 mg total) by mouth 2 (two) times daily with a meal., Disp: 60 tablet, Rfl: 6   cefadroxil (DURICEF) 1 g tablet, Take 1 tablet (1 g total) by mouth 2 (two) times daily. (Patient not taking: Reported on 10/26/2021), Disp: 180 tablet, Rfl: 0   clotrimazole-betamethasone (LOTRISONE) lotion, Apply 1 application topically 2 (two) times daily as needed (itching)., Disp: , Rfl:    Continuous Blood Gluc Sensor (DEXCOM G6 SENSOR) MISC, 1 Device by Does not apply route as directed., Disp: 9 each, Rfl: 3   Continuous Blood Gluc Transmit (DEXCOM G6 TRANSMITTER) MISC, 1 Device by Does not apply route as directed., Disp: 1 each, Rfl: 3   empagliflozin (JARDIANCE) 10 MG TABS tablet, Take 10 mg by mouth daily., Disp: , Rfl:    Ferrous Sulfate (IRON) 325 (65 Fe) MG TABS, Take 1 tablet (325 mg total) by mouth 2 (two) times daily before a meal., Disp: , Rfl:    folic acid (FOLVITE) 1 MG tablet, Take 1 tablet (1 mg total) by mouth daily., Disp: 30 tablet, Rfl: 2   gabapentin (NEURONTIN) 300 MG capsule, Take 300 mg by mouth 2 (two) times daily., Disp: , Rfl:    Insulin Glargine (BASAGLAR KWIKPEN) 100 UNIT/ML, Inject 45 Units into the skin at bedtime., Disp: 45 mL, Rfl: 0   insulin lispro (HUMALOG) 100 UNIT/ML injection, Inject 0.05 mLs (5 Units total) into the skin 3 (three) times daily before meals. (Patient taking differently: Inject 5-15 Units into the skin 3 (three) times daily before meals. Per sliding scale), Disp: 10 mL, Rfl: 0   latanoprost (XALATAN) 0.005 % ophthalmic solution, Place 1 drop into both eyes at bedtime., Disp: , Rfl:    levothyroxine (SYNTHROID, LEVOTHROID) 75 MCG tablet, Take 75 mcg by mouth daily before breakfast., Disp: , Rfl:    losartan (COZAAR) 25 MG tablet, Take 1 tablet (25 mg total) by mouth daily., Disp: 30 tablet, Rfl: 6   LUMIGAN 0.01 %  SOLN, Place 1 drop into both eyes at bedtime., Disp: , Rfl:    Multiple Vitamins-Minerals (ALIVE ONCE DAILY WOMENS 50+ PO), Take 1 tablet by mouth daily., Disp: , Rfl:    polyethylene glycol (MIRALAX / GLYCOLAX) 17 g packet, Take 17 g by mouth daily. (Patient not taking: Reported on 10/31/2021), Disp: 14 each, Rfl: 0   senna (SENOKOT) 8.6 MG TABS tablet, Take 1 tablet (8.6 mg total) by mouth 2 (two) times daily. For constipation. (Patient not taking: Reported on 10/31/2021), Disp: , Rfl: 0   spironolactone (ALDACTONE) 25 MG tablet, Take 0.5 tablets (12.5 mg total) by mouth daily., Disp: 90 tablet, Rfl: 0   torsemide (DEMADEX) 20 MG tablet, Take 1 tablet (20 mg total) by mouth daily., Disp: 30 tablet, Rfl: 11   Travoprost, BAK Free, (TRAVATAN) 0.004 % SOLN ophthalmic solution, Place 1 drop into both eyes at bedtime., Disp: , Rfl:  Allergies  Allergen Reactions   Other Other (See Comments)    Blood Product Refusal (Jehovah's witness)  Tape Other (See Comments)    Leaves blisters and marks on the skin   Sulfa Antibiotics Rash   Uloric [Febuxostat] Rash     Social History   Socioeconomic History   Marital status: Widowed    Spouse name: Not on file   Number of children: 3   Years of education: Not on file   Highest education level: Some college, no degree  Occupational History   Occupation: retired  Tobacco Use   Smoking status: Former    Packs/day: 1.00    Years: 22.00    Total pack years: 22.00    Types: Cigarettes    Quit date: 1983    Years since quitting: 40.4   Smokeless tobacco: Never  Vaping Use   Vaping Use: Never used  Substance and Sexual Activity   Alcohol use: Yes    Comment: 05/22/2018 "glass of wine couple times/wk"   Drug use: Not Currently   Sexual activity: Not Currently    Birth control/protection: None, Post-menopausal  Other Topics Concern   Not on file  Social History Narrative   Not on file   Social Determinants of Health   Financial Resource  Strain: Low Risk  (05/08/2018)   Overall Financial Resource Strain (CARDIA)    Difficulty of Paying Living Expenses: Not hard at all  Food Insecurity: No Food Insecurity (05/08/2018)   Hunger Vital Sign    Worried About Running Out of Food in the Last Year: Never true    [place] in the Last Year: Never true  Transportation Needs: No Transportation Needs (05/08/2018)   PRAPARE - Hydrologist (Medical): No    Lack of Transportation (Non-Medical): No  Physical Activity: Not on file  Stress: Not on file  Social Connections: Not on file  Intimate Partner Violence: Not on file    Physical Exam      Future Appointments  Date Time Provider [place]  11/07/2021  2:30 PM [facility] LAB [facility] None  11/15/2021  9:15 AM [place], Clent Demark, MD [facility] None  12/12/2021 11:30 AM [facility] PA/NP [facility] None  01/31/2022  7:00 AM [facility] DEVICE REMOTES [facility]  02/02/2022 11:45 AM Benay Pike, MD [facility] None  02/21/2022 11:00 AM Bensimhon, Shaune Pascal, MD [facility] None  05/02/2022  7:00 AM [facility] DEVICE REMOTES [facility]     ACTION: Home visit completed

## 2021-11-07 ENCOUNTER — Telehealth (HOSPITAL_COMMUNITY): Payer: Self-pay

## 2021-11-07 ENCOUNTER — Ambulatory Visit (HOSPITAL_COMMUNITY)
Admission: RE | Admit: 2021-11-07 | Discharge: 2021-11-07 | Disposition: A | Discharge status: Home / Self Care | Payer: Medicare HMO | Source: Ambulatory Visit | Attending: Internal Medicine | Admitting: Internal Medicine

## 2021-11-07 ENCOUNTER — Telehealth: Payer: Self-pay

## 2021-11-07 ENCOUNTER — Other Ambulatory Visit (HOSPITAL_COMMUNITY): Payer: Self-pay | Admitting: Family Medicine

## 2021-11-07 DIAGNOSIS — T8453XD Infection and inflammatory reaction due to internal right knee prosthesis, subsequent encounter: Secondary | ICD-10-CM | POA: Diagnosis not present

## 2021-11-07 DIAGNOSIS — I5022 Chronic systolic (congestive) heart failure: Secondary | ICD-10-CM | POA: Diagnosis not present

## 2021-11-07 DIAGNOSIS — Z96651 Presence of right artificial knee joint: Secondary | ICD-10-CM | POA: Diagnosis not present

## 2021-11-07 DIAGNOSIS — M25561 Pain in right knee: Secondary | ICD-10-CM | POA: Diagnosis not present

## 2021-11-07 LAB — BASIC METABOLIC PANEL
Anion gap: 10 (ref 5–15)
BUN: 30 mg/dL — ABNORMAL HIGH (ref 8–23)
CO2: 28 mmol/L (ref 22–32)
Calcium: 9.5 mg/dL (ref 8.9–10.3)
Chloride: 101 mmol/L (ref 98–111)
Creatinine, Ser: 1.57 mg/dL — ABNORMAL HIGH (ref 0.44–1.00)
GFR, Estimated: 34 mL/min — ABNORMAL LOW (ref >60)
Glucose, Bld: 139 mg/dL — ABNORMAL HIGH (ref 70–99)
Potassium: 4.3 mmol/L (ref 3.5–5.1)
Sodium: 139 mmol/L (ref 135–145)

## 2021-11-07 MED ORDER — TORSEMIDE 20 MG PO TABS
20.0000 mg | ORAL_TABLET | ORAL | 11 refills | Status: DC | PRN
Start: 2021-11-07 — End: 2022-08-16

## 2021-11-07 NOTE — Telephone Encounter (Signed)
Patient assistance paperwork has been dropped off for Humalog and placed in provider box.

## 2021-11-07 NOTE — Telephone Encounter (Signed)
Danielle [name] Patient IT sales professional dropped off at [employer] [facility] for Danielle [name] for [employer].    Danielle [name], Danielle [name] 11/07/2021

## 2021-11-07 NOTE — Telephone Encounter (Signed)
Noted lab results to decrease Torsemide to '20mg'$  daily however she has been taking '20mg'$  daily since last weeks clinic visit 6/6. Will route to provider.   [name], [place] 11/07/2021

## 2021-11-10 NOTE — Progress Notes (Signed)
Remote pacemaker transmission.   

## 2021-11-12 DIAGNOSIS — E1165 Type 2 diabetes mellitus with hyperglycemia: Secondary | ICD-10-CM | POA: Diagnosis not present

## 2021-11-13 ENCOUNTER — Telehealth: Payer: Self-pay | Admitting: Internal Medicine

## 2021-11-13 ENCOUNTER — Encounter: Payer: Self-pay | Admitting: Internal Medicine

## 2021-11-13 ENCOUNTER — Telehealth (HOSPITAL_COMMUNITY): Payer: Self-pay

## 2021-11-13 ENCOUNTER — Other Ambulatory Visit (HOSPITAL_COMMUNITY): Payer: Self-pay

## 2021-11-13 NOTE — Progress Notes (Signed)
Paramedicine Encounter    Patient ID: Danielle [name], female    DOB: Oct 13, 1945, 76 y.o.   MRN: [id number]   Arrived for home visit for [facility] who reports she is feeling good today other than some aches and pains from her arthritis. I obtained vitals and assessment.   WT- 195lbs BP- 110/70 HR- 71 RR- 16 CBG- 196  Compression socks on today, swelling improved. Weight is 195 today with no shortness of breath, dizziness or chest pain. She reports having some dry, itchy ears and eyes. I recommended OTC Claritin. Danielle [name] reports she will be seeing Dr. Delfina Redwood tomorrow about same.   She also reports she tried to get an appointment with [facility] but they are booked out until October. I will continue to assist Danielle [name] in maintaining patient appointments.   Today we swapped Dexcom sensors- next swap in 10 days.   I reviewed meds and confirmed same filling pill box for one week.   Refills called into [facility]-  Levothyroxine  We reviewed appointments and wrote down same for reminders, she has transportation set up with [facility].   Home visit complete. [name].     Danielle [name], Danielle [name] 11/13/2021     Patient Care Team: Seward Carol, MD as PCP - General (Internal Medicine) Constance Haw, MD as PCP - Electrophysiology (Cardiology) Sueanne Margarita, MD as PCP - Sleep Medicine (Cardiology) Bensimhon, Shaune Pascal, MD as PCP - Cardiology (Cardiology) Jorge Ny, LCSW as Social Worker (Licensed Clinical Social Worker) Shamleffer, Melanie Crazier, MD as Consulting Physician (Endocrinology)  Patient Active Problem List   Diagnosis Date Noted   DVT (deep venous thrombosis) ([place]) 07/31/2021   Acute blood loss anemia    Septic Arthritis, Infection of prosthetic right knee joint ([place]), MSSA infection 53/29/9242   Complication of internal right knee prosthesis ([place]) 07/10/2021   CAD (coronary artery disease) 06/08/2021   Carotid artery  disease ([place]) 06/08/2021   Symptomatic bradycardia 06/08/2021   Paroxysmal atrial fibrillation ([place]) 06/08/2021   CKD (chronic kidney disease), stage III ([place]) 06/08/2021   Polyneuropathy associated with underlying disease ([place]) 05/04/2020   Severe nonproliferative diabetic retinopathy of left eye, with macular edema, associated with type 2 diabetes mellitus ([place]) 11/05/2019   Severe nonproliferative diabetic retinopathy of right eye, with macular edema, associated with type 2 diabetes mellitus ([place]) 11/05/2019   Retinal hemorrhage of right eye 11/05/2019   Retinal hemorrhage of left eye 11/05/2019   Diabetes mellitus ([place]) 05/14/2019   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, with long-term current use of insulin ([place]) 05/14/2019   Type 2 diabetes mellitus with diabetic polyneuropathy, with long-term current use of insulin ([place]) 05/14/2019   History of gout 05/20/2018   Primary localized osteoarthritis of right knee 05/13/2018   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) 12/20/2017   Obesity (BMI 30-39.9) 11/11/2017   OSA (obstructive sleep apnea) 02/18/2017   Essential hypertension    L Breast Cancer    Hypothyroidism     Current Outpatient Medications:    acetaminophen (TYLENOL) 500 MG tablet, Take 500-1,000 mg by mouth every 6 (six) hours as needed (for pain or headaches)., Disp: , Rfl:    allopurinol (ZYLOPRIM) 100 MG tablet, Take 100 mg by mouth daily., Disp: , Rfl:    apixaban (ELIQUIS) 5 MG TABS tablet, Take 1 tablet (5 mg total) by mouth 2 (two) times daily., Disp: 60 tablet, Rfl: 11   atorvastatin (LIPITOR) 80 MG tablet, Take 1  tablet (80 mg total) by mouth at bedtime., Disp: 90 tablet, Rfl: 3   Biotin 10 MG TABS, Take 10 mg by mouth daily., Disp: , Rfl:    Blood Glucose Monitoring Suppl (ONETOUCH VERIO) w/Device KIT, Use to check blood sugar, Disp: 1 kit, Rfl: 0   carvedilol (COREG) 3.125 MG tablet, Take 1 tablet (3.125 mg total) by mouth 2 (two) times daily  with a meal., Disp: 60 tablet, Rfl: 6   cefadroxil (DURICEF) 1 g tablet, Take 1 tablet (1 g total) by mouth 2 (two) times daily. (Patient not taking: Reported on 10/26/2021), Disp: 180 tablet, Rfl: 0   clotrimazole-betamethasone (LOTRISONE) lotion, Apply 1 application topically 2 (two) times daily as needed (itching)., Disp: , Rfl:    Continuous Blood Gluc Sensor (DEXCOM G6 SENSOR) MISC, 1 Device by Does not apply route as directed., Disp: 9 each, Rfl: 3   Continuous Blood Gluc Transmit (DEXCOM G6 TRANSMITTER) MISC, 1 Device by Does not apply route as directed., Disp: 1 each, Rfl: 3   empagliflozin (JARDIANCE) 10 MG TABS tablet, Take 10 mg by mouth daily., Disp: , Rfl:    Ferrous Sulfate (IRON) 325 (65 Fe) MG TABS, Take 1 tablet (325 mg total) by mouth 2 (two) times daily before a meal. (Patient not taking: Reported on 11/06/2021), Disp: , Rfl:    folic acid (FOLVITE) 1 MG tablet, Take 1 tablet (1 mg total) by mouth daily., Disp: 30 tablet, Rfl: 2   gabapentin (NEURONTIN) 300 MG capsule, Take 300 mg by mouth 2 (two) times daily., Disp: , Rfl:    Insulin Glargine (BASAGLAR KWIKPEN) 100 UNIT/ML, Inject 45 Units into the skin at bedtime., Disp: 45 mL, Rfl: 0   insulin lispro (HUMALOG) 100 UNIT/ML injection, Inject 0.05 mLs (5 Units total) into the skin 3 (three) times daily before meals. (Patient not taking: Reported on 11/06/2021), Disp: 10 mL, Rfl: 0   latanoprost (XALATAN) 0.005 % ophthalmic solution, Place 1 drop into both eyes at bedtime., Disp: , Rfl:    levothyroxine (SYNTHROID, LEVOTHROID) 75 MCG tablet, Take 75 mcg by mouth daily before breakfast., Disp: , Rfl:    losartan (COZAAR) 25 MG tablet, Take 1 tablet (25 mg total) by mouth daily., Disp: 30 tablet, Rfl: 6   LUMIGAN 0.01 % SOLN, Place 1 drop into both eyes at bedtime., Disp: , Rfl:    Multiple Vitamins-Minerals (ALIVE ONCE DAILY WOMENS 50+ PO), Take 1 tablet by mouth daily., Disp: , Rfl:    polyethylene glycol (MIRALAX / GLYCOLAX) 17 g  packet, Take 17 g by mouth daily. (Patient not taking: Reported on 10/31/2021), Disp: 14 each, Rfl: 0   senna (SENOKOT) 8.6 MG TABS tablet, Take 1 tablet (8.6 mg total) by mouth 2 (two) times daily. For constipation. (Patient not taking: Reported on 10/31/2021), Disp: , Rfl: 0   spironolactone (ALDACTONE) 25 MG tablet, Take 0.5 tablets (12.5 mg total) by mouth daily., Disp: 90 tablet, Rfl: 0   torsemide (DEMADEX) 20 MG tablet, Take 1 tablet (20 mg total) by mouth as needed., Disp: 30 tablet, Rfl: 11   Travoprost, BAK Free, (TRAVATAN) 0.004 % SOLN ophthalmic solution, Place 1 drop into both eyes at bedtime., Disp: , Rfl:  Allergies  Allergen Reactions   Other Other (See Comments)    Blood Product Refusal (Jehovah's witness)     Tape Other (See Comments)    Leaves blisters and marks on the skin   Sulfa Antibiotics Rash   Uloric [Febuxostat] Rash  Social History   Socioeconomic History   Marital status: Widowed    Spouse name: Not on file   Number of children: 3   Years of education: Not on file   Highest education level: Some college, no degree  Occupational History   Occupation: retired  Tobacco Use   Smoking status: Former    Packs/day: 1.00    Years: 22.00    Total pack years: 22.00    Types: Cigarettes    Quit date: 1983    Years since quitting: 40.4   Smokeless tobacco: Never  Vaping Use   Vaping Use: Never used  Substance and Sexual Activity   Alcohol use: Yes    Comment: 05/22/2018 "glass of wine couple times/wk"   Drug use: Not Currently   Sexual activity: Not Currently    Birth control/protection: None, Post-menopausal  Other Topics Concern   Not on file  Social History Narrative   Not on file   Social Determinants of Health   Financial Resource Strain: Low Risk  (05/08/2018)   Overall Financial Resource Strain (CARDIA)    Difficulty of Paying Living Expenses: Not hard at all  Food Insecurity: No Food Insecurity (05/08/2018)   Hunger Vital Sign     Worried About Running Out of Food in the Last Year: Never true    [place] in the Last Year: Never true  Transportation Needs: No Transportation Needs (05/08/2018)   PRAPARE - Hydrologist (Medical): No    Lack of Transportation (Non-Medical): No  Physical Activity: Not on file  Stress: Not on file  Social Connections: Not on file  Intimate Partner Violence: Not on file    Physical Exam      Future Appointments  Date Time Provider [place]  11/15/2021  9:15 AM Rankin, Clent Demark, MD RDE-RDE None  12/07/2021  1:00 PM [facility] PAT 4 [facility] None  12/12/2021 11:30 AM [facility] PA/NP [facility] None  01/31/2022  7:00 AM [facility] DEVICE REMOTES [facility]  02/02/2022 11:45 AM Benay Pike, MD [facility] None  02/21/2022 11:00 AM Bensimhon, Shaune Pascal, MD [facility] None  05/02/2022  7:00 AM [facility] DEVICE REMOTES [facility]     ACTION: Home visit completed

## 2021-11-13 NOTE — Telephone Encounter (Signed)
Spoke with Danielle [name] today during home visit as she tried to call [facility] to set up an appointment to be seen for follow up however their next available isn't until October. Patient has not been seen in this clinic due to her prolonged hospitalizations and admissions to skilled nursing facilities. I will ensure she adheres to scheduled appointments as she is needing her insulin from the [facility] in which she has been receiving same for free from this patient assistance foundation due to expensive cost for over a year. [name] agreed with plan and will continue to use her Basaglar as prescribed and controlled diet, monitoring her Dexcom throughout the day until she receives her Humalog.   I will continue to assist with med and appointment management.   [name], [place] 11/13/2021

## 2021-11-13 NOTE — Telephone Encounter (Signed)
Please schedule the patient for the next available follow-up visit on diabetes management.    The patient has not been seen in 15 months but somehow I received a clearance on her and patient assistance papers       Thanks

## 2021-11-14 ENCOUNTER — Other Ambulatory Visit: Payer: Self-pay | Admitting: Cardiology

## 2021-11-14 DIAGNOSIS — E1122 Type 2 diabetes mellitus with diabetic chronic kidney disease: Secondary | ICD-10-CM | POA: Diagnosis not present

## 2021-11-14 DIAGNOSIS — H6093 Unspecified otitis externa, bilateral: Secondary | ICD-10-CM | POA: Diagnosis not present

## 2021-11-14 NOTE — Telephone Encounter (Signed)
Spoke with [employer] at [facility] office and advise that patient has not been seen in a while and that we will not be able to fill preoperative risk assessment out.

## 2021-11-14 NOTE — Telephone Encounter (Signed)
Left vm for patient to callback. Needs to be schedule for appointment.

## 2021-11-15 ENCOUNTER — Encounter (INDEPENDENT_AMBULATORY_CARE_PROVIDER_SITE_OTHER): Payer: Medicare HMO | Admitting: Ophthalmology

## 2021-11-15 ENCOUNTER — Ambulatory Visit (INDEPENDENT_AMBULATORY_CARE_PROVIDER_SITE_OTHER): Payer: Medicare HMO | Admitting: Ophthalmology

## 2021-11-15 ENCOUNTER — Encounter (INDEPENDENT_AMBULATORY_CARE_PROVIDER_SITE_OTHER): Payer: Self-pay | Admitting: Ophthalmology

## 2021-11-15 DIAGNOSIS — E113411 Type 2 diabetes mellitus with severe nonproliferative diabetic retinopathy with macular edema, right eye: Secondary | ICD-10-CM

## 2021-11-15 DIAGNOSIS — Z961 Presence of intraocular lens: Secondary | ICD-10-CM | POA: Diagnosis not present

## 2021-11-15 DIAGNOSIS — E113412 Type 2 diabetes mellitus with severe nonproliferative diabetic retinopathy with macular edema, left eye: Secondary | ICD-10-CM

## 2021-11-15 MED ORDER — BEVACIZUMAB 2.5 MG/0.1ML IZ SOSY
2.5000 mg | PREFILLED_SYRINGE | INTRAVITREAL | Status: AC | PRN
  Administered 2021-11-15: 2.5 mg via INTRAVITREAL

## 2021-11-15 NOTE — Telephone Encounter (Signed)
LMTCB

## 2021-11-15 NOTE — Assessment & Plan Note (Signed)
Overall stable OD

## 2021-11-15 NOTE — Progress Notes (Signed)
11/15/2021     CHIEF COMPLAINT Patient presents for  Chief Complaint  Patient presents with   Diabetic Retinopathy with Macular Edema      HISTORY OF PRESENT ILLNESS: Danielle [name] is a 76 y.o. female who presents to the clinic today for:   HPI   6 weeks dilate OS, Avastin OCT OS. Patient states vision is stable and unchanged since last visit. Patient reports floaters in both eyes, states she sometimes notices a flash from the left eye. Patient states she uses Latanoprost QHS OU, and she uses an eye drop for dry eyes in the morning Patient reports her ears hurt her. Last edited by Laurin Coder on 11/15/2021  8:32 AM.      Referring physician: Seward Carol, MD [address] [place],  [place] [address]  HISTORICAL INFORMATION:   Selected notes from the MEDICAL RECORD NUMBER    Lab Results  Component Value Date   HGBA1C 8.2 (H) 07/10/2021     CURRENT MEDICATIONS: Current Outpatient Medications (Ophthalmic Drugs)  Medication Sig   latanoprost (XALATAN) 0.005 % ophthalmic solution Place 1 drop into both eyes at bedtime.   LUMIGAN 0.01 % SOLN Place 1 drop into both eyes at bedtime.   Travoprost, BAK Free, (TRAVATAN) 0.004 % SOLN ophthalmic solution Place 1 drop into both eyes at bedtime.   No current facility-administered medications for this visit. (Ophthalmic Drugs)   Current Outpatient Medications (Other)  Medication Sig   acetaminophen (TYLENOL) 500 MG tablet Take 500-1,000 mg by mouth every 6 (six) hours as needed (for pain or headaches).   allopurinol (ZYLOPRIM) 100 MG tablet Take 100 mg by mouth daily.   apixaban (ELIQUIS) 5 MG TABS tablet Take 1 tablet (5 mg total) by mouth 2 (two) times daily.   atorvastatin (LIPITOR) 80 MG tablet Take 1 tablet (80 mg total) by mouth at bedtime.   Biotin 10 MG TABS Take 10 mg by mouth daily.   Blood Glucose Monitoring Suppl (ONETOUCH VERIO) w/Device KIT Use to check blood sugar   carvedilol (COREG) 3.125 MG  tablet Take 1 tablet (3.125 mg total) by mouth 2 (two) times daily with a meal.   cefadroxil (DURICEF) 1 g tablet Take 1 tablet (1 g total) by mouth 2 (two) times daily. (Patient not taking: Reported on 10/26/2021)   clotrimazole-betamethasone (LOTRISONE) lotion Apply 1 application topically 2 (two) times daily as needed (itching).   Continuous Blood Gluc Sensor (DEXCOM G6 SENSOR) MISC 1 Device by Does not apply route as directed.   Continuous Blood Gluc Transmit (DEXCOM G6 TRANSMITTER) MISC 1 Device by Does not apply route as directed.   empagliflozin (JARDIANCE) 10 MG TABS tablet Take 10 mg by mouth daily.   Ferrous Sulfate (IRON) 325 (65 Fe) MG TABS Take 1 tablet (325 mg total) by mouth 2 (two) times daily before a meal. (Patient not taking: Reported on 9/62/9528)   folic acid (FOLVITE) 1 MG tablet Take 1 tablet (1 mg total) by mouth daily.   gabapentin (NEURONTIN) 300 MG capsule Take 300 mg by mouth 2 (two) times daily.   Insulin Glargine (BASAGLAR KWIKPEN) 100 UNIT/ML Inject 45 Units into the skin at bedtime.   insulin lispro (HUMALOG) 100 UNIT/ML injection Inject 0.05 mLs (5 Units total) into the skin 3 (three) times daily before meals. (Patient not taking: Reported on 11/06/2021)   levothyroxine (SYNTHROID, LEVOTHROID) 75 MCG tablet Take 75 mcg by mouth daily before breakfast.   losartan (COZAAR) 25 MG tablet  Take 1 tablet (25 mg total) by mouth daily.   Multiple Vitamins-Minerals (ALIVE ONCE DAILY WOMENS 50+ PO) Take 1 tablet by mouth daily.   polyethylene glycol (MIRALAX / GLYCOLAX) 17 g packet Take 17 g by mouth daily. (Patient not taking: Reported on 10/31/2021)   senna (SENOKOT) 8.6 MG TABS tablet Take 1 tablet (8.6 mg total) by mouth 2 (two) times daily. For constipation. (Patient not taking: Reported on 10/31/2021)   spironolactone (ALDACTONE) 25 MG tablet Take 0.5 tablets (12.5 mg total) by mouth daily.   torsemide (DEMADEX) 20 MG tablet Take 1 tablet (20 mg total) by mouth as needed.   No  current facility-administered medications for this visit. (Other)      REVIEW OF SYSTEMS: ROS   Negative for: Constitutional, Gastrointestinal, Neurological, Skin, Genitourinary, Musculoskeletal, HENT, Endocrine, Cardiovascular, Eyes, Respiratory, Psychiatric, Allergic/Imm, Heme/Lymph Last edited by Hurman Horn, MD on 11/15/2021  9:07 AM.       ALLERGIES Allergies  Allergen Reactions   Other Other (See Comments)    Blood Product Refusal (Jehovah's witness)     Tape Other (See Comments)    Leaves blisters and marks on the skin   Sulfa Antibiotics Rash   Uloric [Febuxostat] Rash    PAST MEDICAL HISTORY 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[place]) 02/05/2017   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 05/20/2018   Acute kidney failure ([place]) 05/15/2018   Arthritis    "all over" (05/22/2018)   Blood dyscrasia    per pt-has small blood cells-appears as if anemic   Breast cancer, left breast ([facility]) 1985   CHF (congestive heart failure) ([facility])    Complication of anesthesia    difficult to awaken from per pt   Coronary artery disease    Dyspnea    Essential hypertension    Heart disease    Heart murmur    History of gout    Hypothyroidism    Obesity (BMI 30-39.9) 11/11/2017   OSA (obstructive sleep apnea) 02/18/2017    severe obstructive sleep apnea with an AHI of 75.6/h and mild central sleep apnea with a CAI of 7.7/h.  Oxygen saturations dropped as low as 82%.   He is on CPAP at 9 cm H2O.   OSA on CPAP    Personal history of chemotherapy    Personal history of radiation therapy    Presence of permanent cardiac pacemaker    Primary localized osteoarthritis of right knee 05/13/2018   Refusal of blood transfusions as patient is Jehovah's Witness    Type II diabetes mellitus ([place])    Past Surgical History:  Procedure Laterality Date   APPLICATION OF WOUND VAC Right 04/14/2021   Procedure:  APPLICATION OF WOUND VAC;  Surgeon: Willaim Sheng, MD;  Location: [facility];  Service: Orthopedics;  Laterality: Right;   BREAST BIOPSY Left 1985   BREAST LUMPECTOMY Left 1985   BREAST LUMPECTOMY WITH NEEDLE LOCALIZATION AND AXILLARY LYMPH NODE DISSECTION  1985   CATARACT EXTRACTION W/ INTRAOCULAR LENS  IMPLANT, BILATERAL Bilateral    CORONARY ANGIOPLASTY WITH STENT PLACEMENT  2010, 2012,2017    2 done 2010 and 1 replaced 2012 and 2 replaced in 2017   [place]     EXCISIONAL TOTAL KNEE ARTHROPLASTY WITH ANTIBIOTIC SPACERS Right 07/12/2021   Procedure: EXCISIONAL TOTAL KNEE ARTHROPLASTY WITH ANTIBIOTIC SPACERS;  Surgeon: Willaim Sheng, MD;  Location: [facility];  Service:  Orthopedics;  Laterality: Right;   I & D KNEE WITH POLY EXCHANGE Right 04/14/2021   Procedure: IRRIGATION AND DEBRIDEMENT KNEE WITH POLY EXCHANGE;  Surgeon: Willaim Sheng, MD;  Location: [facility];  Service: Orthopedics;  Laterality: Right;   INSERT / REPLACE / REMOVE PACEMAKER  07/19/2014   JOINT REPLACEMENT     REPLACEMENT TOTAL KNEE Left 09/2012   THYROIDECTOMY, PARTIAL  1978   TONSILLECTOMY     TOTAL KNEE ARTHROPLASTY Right 05/13/2018   Procedure: TOTAL KNEE ARTHROPLASTY;  Surgeon: Marchia Bond, MD;  Location: [facility];  Service: Orthopedics;  Laterality: Right;   TUBAL LIGATION      FAMILY HISTORY Family History  Problem Relation Age of Onset   Multiple myeloma Mother    Heart disease Father    Other Sister    Heart disease Brother    Diabetes Sister    Other Sister     SOCIAL HISTORY Social History   Tobacco Use   Smoking status: Former    Packs/day: 1.00    Years: 22.00    Total pack years: 22.00    Types: Cigarettes    Quit date: 8    Years since quitting: 40.4   Smokeless tobacco: Never  Vaping Use   Vaping Use: Never used  Substance Use Topics   Alcohol use: Yes    Comment: 05/22/2018 "glass of wine couple times/wk"   Drug use: Not Currently          OPHTHALMIC EXAM:  Base Eye Exam     Visual Acuity (ETDRS)       Right Left   Dist cc 20/25 -1 20/30 -1    Correction: Glasses         Tonometry (Tonopen, 8:37 AM)       Right Left   Pressure 10 13         Pupils       Pupils Dark Light APD   Right PERRL 4 3 None   Left PERRL 4 3 +2         Extraocular Movement       Right Left    Full Full         Neuro/Psych     Oriented x3: Yes   Mood/Affect: Normal         Dilation     Left eye: 1.0% Mydriacyl, 2.5% Phenylephrine @ 8:37 AM           Slit Lamp and Fundus Exam     External Exam       Right Left   External Normal Normal         Slit Lamp Exam       Right Left   Lids/Lashes Normal Normal   Conjunctiva/Sclera White and quiet White and quiet   Cornea Clear Clear   Anterior Chamber Deep and quiet Deep and quiet   Iris Round and reactive Round and reactive   Lens Centered posterior chamber intraocular lens, Open posterior capsule Centered posterior chamber intraocular lens, Open posterior capsule   Anterior Vitreous Normal Normal         Fundus Exam       Right Left   Posterior Vitreous  Normal   Disc  Normal   C/D Ratio  0.75   Macula  Microaneurysms, macular thickening, Severe clinically significant macular edema centered threatening but not center involved OS., Exudates,    Vessels  NPDR- Moderate   Periphery  Normal  IMAGING AND PROCEDURES  Imaging and Procedures for 11/15/21  Intravitreal Injection, Pharmacologic Agent - OS - Left Eye       Time Out 11/15/2021. 9:10 AM. Confirmed correct patient, procedure, site, and patient consented.   Anesthesia Topical anesthesia was used. Anesthetic medications included Lidocaine 4%.   Procedure Preparation included 5% betadine to ocular surface, 10% betadine to eyelids, Tobramycin 0.3%. A 30 gauge needle was used.   Injection: 2.5 mg bevacizumab 2.5 MG/0.1ML   Route: Intravitreal, Site: Left Eye   NDC:  [id number], Lot: [id number], Expiration date: 12/24/2021   Post-op Post injection exam found visual acuity of at least counting fingers. The patient tolerated the procedure well. There were no complications. The patient received written and verbal post procedure care education. Post injection medications included ocuflox.      OCT, Retina - OU - Both Eyes       Right Eye Quality was good. Scan locations included subfoveal. Central Foveal Thickness: 276. Progression has been stable. Findings include normal foveal contour.   Left Eye Quality was good. Scan locations included subfoveal. Central Foveal Thickness: 274. Progression has improved. Findings include cystoid macular edema.   Notes CSME, with protected fovea,  center threatened temporally OS.  Will need AVASTIN OS,,  repeated now,              ASSESSMENT/PLAN:  Severe nonproliferative diabetic retinopathy of left eye, with macular edema, associated with type 2 diabetes mellitus ([place]) Much improved OS post Avastin No. 1 for the symptoms threatening disease of vision OS.  Repeat injection Avastin OS today  Severe nonproliferative diabetic retinopathy of right eye, with macular edema, associated with type 2 diabetes mellitus ([place]) Overall stable OD  Pseudophakia of both eyes OU doing well looks great     ICD-10-CM   1. Severe nonproliferative diabetic retinopathy of left eye, with macular edema, associated with type 2 diabetes mellitus ([facility])  J18.8416 Intravitreal Injection, Pharmacologic Agent - OS - Left Eye    OCT, Retina - OU - Both Eyes    bevacizumab (AVASTIN) SOSY 2.5 mg    2. Severe nonproliferative diabetic retinopathy of right eye, with macular edema, associated with type 2 diabetes mellitus ([place])  E11.3411     3. Pseudophakia of both eyes  Z96.1       1.  Repeat intravitreal Avastin OS today, with CSME temporally.  Improved overall OS.  2.  OD, stable severe NPDR no progression of CSME  3.  Follow-up in  1 month however patient having knee surgery December 11, 2021, thus we will delay her next visit to 8 weeks and now  Ophthalmic Meds Ordered this visit:  Meds ordered this encounter  Medications   bevacizumab (AVASTIN) SOSY 2.5 mg       Return in about 1 month (around 12/15/2021) for DILATE OU, OPTOS FFA L/R, COLOR FP, AVASTIN OCT, OS.  There are no Patient Instructions on file for this visit.   Explained the diagnoses, plan, and follow up with the patient and they expressed understanding.  Patient expressed understanding of the importance of proper follow up care.   Clent Demark Rankin M.D. Diseases & Surgery of the Retina and Vitreous [facility] [place] 11/15/21     Abbreviations: M myopia (nearsighted); A astigmatism; H hyperopia (farsighted); P presbyopia; Mrx spectacle prescription;  CTL contact lenses; OD right eye; OS left eye; OU both eyes  XT exotropia; ET esotropia; PEK punctate epithelial keratitis; PEE punctate epithelial erosions; DES dry eye  syndrome; MGD meibomian gland dysfunction; ATs artificial tears; PFAT's preservative free artificial tears; [place] nuclear sclerotic cataract; PSC posterior subcapsular cataract; ERM epi-retinal membrane; PVD posterior vitreous detachment; RD retinal detachment; DM diabetes mellitus; DR diabetic retinopathy; NPDR non-proliferative diabetic retinopathy; PDR proliferative diabetic retinopathy; CSME clinically significant macular edema; DME diabetic macular edema; dbh dot blot hemorrhages; CWS cotton wool spot; POAG primary open angle glaucoma; C/D cup-to-disc ratio; HVF humphrey visual field; GVF goldmann visual field; OCT optical coherence tomography; IOP intraocular pressure; BRVO Branch retinal vein occlusion; CRVO central retinal vein occlusion; CRAO central retinal artery occlusion; BRAO branch retinal artery occlusion; RT retinal tear; SB scleral buckle; PPV pars plana vitrectomy; VH Vitreous hemorrhage; PRP panretinal laser  photocoagulation; IVK intravitreal kenalog; VMT vitreomacular traction; MH Macular hole;  NVD neovascularization of the disc; NVE neovascularization elsewhere; AREDS age related eye disease study; ARMD age related macular degeneration; POAG primary open angle glaucoma; EBMD epithelial/anterior basement membrane dystrophy; ACIOL anterior chamber intraocular lens; IOL intraocular lens; PCIOL posterior chamber intraocular lens; Phaco/IOL phacoemulsification with intraocular lens placement; [place] photorefractive keratectomy; LASIK laser assisted in situ keratomileusis; HTN hypertension; DM diabetes mellitus; COPD chronic obstructive pulmonary disease

## 2021-11-15 NOTE — Assessment & Plan Note (Signed)
Much improved OS post Avastin No. 1 for the symptoms threatening disease of vision OS.  Repeat injection Avastin OS today

## 2021-11-15 NOTE — Assessment & Plan Note (Signed)
OU doing well looks great

## 2021-11-20 NOTE — H&P (Signed)
KNEE ARTHROPLASTY ADMISSION H&P  Patient ID: Danielle [name] MRN: [id number] DOB/AGE: September 18, 1945 76 y.o.  Chief Complaint: right knee failed prosthesis.  Planned Procedure Date: 12/15/21 Medical Clearance by Dr. Delfina Redwood   Cardiac Clearance by Dr. Haroldine Laws Endocrine clearance by Dr. Kelton Pillar    HPI: Danielle [name] is a 76 y.o. female who presents for evaluation of FAILED TOTAL KNEE RIGHT. The patient has a history of pain and functional disability in the right knee due to infected TKA prosthesis. Onset of symptoms was abrupt, starting less than 1 year ago with rapidlly worsening course since that time. The patient noted  prior Right TKA in 04/2018 by Dr. Mardelle Matte followed by an I&D with poly exchange in 03/2021 by Dr. Zachery Dakins, and a joint prothesis explant with placement of antibiotic spacer in 06/2021 by Dr. Zachery Dakins . Patient currently rates pain at 10 out of 10 with activity. Patient has night pain, worsening of pain with activity and weight bearing, and pain that interferes with activities of daily living.  Patient has evidence of  a stable articulating spacer in the right knee  by imaging studies. There are multiple loose bodies throughout the joint. Extensive vascular calcifications seen. There is no active infection.  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[place]) 02/05/2017   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 05/20/2018   Acute kidney failure ([place]) 05/15/2018   Arthritis    "all over" (05/22/2018)   Blood dyscrasia    per pt-has small blood cells-appears as if anemic   Breast cancer, left breast ([place]) 1985   CHF (congestive heart failure) (HCC)    Complication of anesthesia    difficult to awaken from per pt   Coronary artery disease    Dyspnea    Essential hypertension    Heart disease    Heart murmur    History of gout    Hypothyroidism    Obesity (BMI 30-39.9) 11/11/2017   OSA  (obstructive sleep apnea) 02/18/2017    severe obstructive sleep apnea with an AHI of 75.6/h and mild central sleep apnea with a CAI of 7.7/h.  Oxygen saturations dropped as low as 82%.   He is on CPAP at 9 cm H2O.   OSA on CPAP    Personal history of chemotherapy    Personal history of radiation therapy    Presence of permanent cardiac pacemaker    Primary localized osteoarthritis of right knee 05/13/2018   Refusal of blood transfusions as patient is Jehovah's Witness    Type II diabetes mellitus ([place])    Past Surgical History:  Procedure Laterality Date   APPLICATION OF WOUND VAC Right 04/14/2021   Procedure: APPLICATION OF WOUND VAC;  Surgeon: Willaim Sheng, MD;  Location: [facility];  Service: Orthopedics;  Laterality: Right;   BREAST BIOPSY Left 1985   BREAST LUMPECTOMY Left 1985   BREAST LUMPECTOMY WITH NEEDLE LOCALIZATION AND AXILLARY LYMPH NODE DISSECTION  1985   CATARACT EXTRACTION W/ INTRAOCULAR LENS  IMPLANT, BILATERAL Bilateral    CORONARY ANGIOPLASTY WITH STENT PLACEMENT  2010, 2012,2017    2 done 2010 and 1 replaced 2012 and 2 replaced in 2017   [place]     EXCISIONAL TOTAL KNEE ARTHROPLASTY WITH ANTIBIOTIC SPACERS Right 07/12/2021   Procedure: EXCISIONAL TOTAL KNEE ARTHROPLASTY WITH ANTIBIOTIC SPACERS;  Surgeon: Willaim Sheng, MD;  Location: [facility];  Service: Orthopedics;  Laterality: Right;   I &  D KNEE WITH POLY EXCHANGE Right 04/14/2021   Procedure: IRRIGATION AND DEBRIDEMENT KNEE WITH POLY EXCHANGE;  Surgeon: Willaim Sheng, MD;  Location: [facility];  Service: Orthopedics;  Laterality: Right;   INSERT / REPLACE / REMOVE PACEMAKER  07/19/2014   JOINT REPLACEMENT     REPLACEMENT TOTAL KNEE Left 09/2012   THYROIDECTOMY, PARTIAL  1978   TONSILLECTOMY     TOTAL KNEE ARTHROPLASTY Right 05/13/2018   Procedure: TOTAL KNEE ARTHROPLASTY;  Surgeon: Marchia Bond, MD;  [facility];  Service: Orthopedics;  Laterality: Right;   TUBAL  LIGATION     Allergies  Allergen Reactions   Other Other (See Comments)    Blood Product Refusal (Jehovah's witness)     Tape Other (See Comments)    Leaves blisters and marks on the skin   Sulfa Antibiotics Rash   Uloric [Febuxostat] Rash   Prior to Admission medications   Medication Sig Start Date End Date Taking? Authorizing Provider  acetaminophen (TYLENOL) 500 MG tablet Take 500-1,000 mg by mouth every 6 (six) hours as needed (for pain or headaches).    [provider]  allopurinol (ZYLOPRIM) 100 MG tablet Take 100 mg by mouth daily.    [provider]  apixaban (ELIQUIS) 5 MG TABS tablet Take 1 tablet (5 mg total) by mouth 2 (two) times daily. 10/19/20   Bensimhon, Shaune Pascal, MD  atorvastatin (LIPITOR) 80 MG tablet Take 1 tablet (80 mg total) by mouth at bedtime. 03/29/21   Bensimhon, Shaune Pascal, MD  Biotin 10 MG TABS Take 10 mg by mouth daily.    [provider]  Blood Glucose Monitoring Suppl (ONETOUCH VERIO) w/Device KIT Use to check blood sugar 01/10/21   Shamleffer, Melanie Crazier, MD  carvedilol (COREG) 3.125 MG tablet Take 1 tablet (3.125 mg total) by mouth 2 (two) times daily with a meal. 09/27/21   Bensimhon, Shaune Pascal, MD  cefadroxil (DURICEF) 1 g tablet Take 1 tablet (1 g total) by mouth 2 (two) times daily. Patient not taking: Reported on 10/26/2021 08/31/21 11/29/21  Prudencio Pair T, MD  clotrimazole-betamethasone (LOTRISONE) lotion Apply 1 application topically 2 (two) times daily as needed (itching). 06/16/20   [provider]  Continuous Blood Gluc Sensor (DEXCOM G6 SENSOR) MISC 1 Device by Does not apply route as directed. 01/19/21   Shamleffer, Melanie Crazier, MD  Continuous Blood Gluc Transmit (DEXCOM G6 TRANSMITTER) MISC 1 Device by Does not apply route as directed. 01/19/21   Shamleffer, Melanie Crazier, MD  empagliflozin (JARDIANCE) 10 MG TABS tablet Take 10 mg by mouth daily.    [provider]  Ferrous Sulfate (IRON) 325 (65 Fe) MG  TABS Take 1 tablet (325 mg total) by mouth 2 (two) times daily before a meal. Patient not taking: Reported on 11/06/2021 08/02/21   Pokhrel, Corrie Mckusick, MD  folic acid (FOLVITE) 1 MG tablet Take 1 tablet (1 mg total) by mouth daily. 10/13/21   Larey Dresser, MD  gabapentin (NEURONTIN) 300 MG capsule Take 300 mg by mouth 2 (two) times daily.    [provider]  Insulin Glargine (BASAGLAR KWIKPEN) 100 UNIT/ML Inject 45 Units into the skin at bedtime. 11/02/21   Shamleffer, Melanie Crazier, MD  insulin lispro (HUMALOG) 100 UNIT/ML injection Inject 0.05 mLs (5 Units total) into the skin 3 (three) times daily before meals. Patient not taking: Reported on 11/06/2021 04/24/21   Kayleen Memos, DO  latanoprost (XALATAN) 0.005 % ophthalmic solution Place 1 drop into both eyes at  bedtime.    [provider]  levothyroxine (SYNTHROID, LEVOTHROID) 75 MCG tablet Take 75 mcg by mouth daily before breakfast.    [provider]  losartan (COZAAR) 25 MG tablet Take 1 tablet (25 mg total) by mouth daily. 09/27/21   Bensimhon, Shaune Pascal, MD  LUMIGAN 0.01 % SOLN Place 1 drop into both eyes at bedtime. 07/23/20   [provider]  Multiple Vitamins-Minerals (ALIVE ONCE DAILY WOMENS 50+ PO) Take 1 tablet by mouth daily.    [provider]  polyethylene glycol (MIRALAX / GLYCOLAX) 17 g packet Take 17 g by mouth daily. Patient not taking: Reported on 10/31/2021 04/25/21   Kayleen Memos, DO  senna (SENOKOT) 8.6 MG TABS tablet Take 1 tablet (8.6 mg total) by mouth 2 (two) times daily. For constipation. Patient not taking: Reported on 10/31/2021 08/02/21   Flora Lipps, MD  spironolactone (ALDACTONE) 25 MG tablet Take 0.5 tablets (12.5 mg total) by mouth daily. 10/31/21 04/29/22  Rafael Bihari, FNP  torsemide (DEMADEX) 20 MG tablet Take 1 tablet (20 mg total) by mouth as needed. 11/07/21   [place], Maricela Bo, FNP  Travoprost, BAK Free, (TRAVATAN) 0.004 % SOLN ophthalmic solution Place 1 drop  into both eyes at bedtime.    [provider]   Social History   Socioeconomic History   Marital status: Widowed    Spouse name: Not on file   Number of children: 3   Years of education: Not on file   Highest education level: Some college, no degree  Occupational History   Occupation: retired  Tobacco Use   Smoking status: Former    Packs/day: 1.00    Years: 22.00    Total pack years: 22.00    Types: Cigarettes    Quit date: 1983    Years since quitting: 40.5   Smokeless tobacco: Never  Vaping Use   Vaping Use: Never used  Substance and Sexual Activity   Alcohol use: Yes    Comment: 05/22/2018 "glass of wine couple times/wk"   Drug use: Not Currently   Sexual activity: Not Currently    Birth control/protection: None, Post-menopausal  Other Topics Concern   Not on file  Social History Narrative   Not on file   Social Determinants of Health   Financial Resource Strain: Low Risk  (05/08/2018)   Overall Financial Resource Strain (CARDIA)    Difficulty of Paying Living Expenses: Not hard at all  Food Insecurity: No Food Insecurity (05/08/2018)   Hunger Vital Sign    Worried About Running Out of Food in the Last Year: Never true    [place] in the Last Year: Never true  Transportation Needs: No Transportation Needs (05/08/2018)   PRAPARE - Hydrologist (Medical): No    Lack of Transportation (Non-Medical): No  Physical Activity: Not on file  Stress: Not on file  Social Connections: Not on file   Family History  Problem Relation Age of Onset   Multiple myeloma Mother    Heart disease Father    Other Sister    Heart disease Brother    Diabetes Sister    Other Sister     ROS: Currently denies lightheadedness, dizziness, Fever, chills, CP, SOB.   No personal history of DVT, PE, or CVA. H/o MI but unsure when No loose teeth Partial dentures present All other systems have been reviewed and were otherwise currently  negative with the exception of those mentioned in  the HPI and as above.  Objective: Vitals: Ht: 5'4" Wt: 203.5 lbs Temp: 98.1 BP: 127/77 Pulse: 77 O2 97% on room air.   Physical Exam: General: Alert, NAD.  Antalgic Gait  HEENT: EOMI, Good Neck Extension  Pulm: No increased work of breathing.  Clear B/L A/P w/o crackle or wheeze.  CV: RRR, No m/g/r appreciated  GI: soft, NT, ND. BS x 4 quadrants Neuro: CN II-XII grossly intact without focal deficit.  Sensation intact distally Skin: No lesions in the area of chief complaint MSK/Surgical Site: + JLT. ROM 30-90.  Decreased strength in extension and flexion.  +EHL/FHL.  NVI. Pain and instability with varus and valgus stress.    Imaging Review Plain radiographs demonstrate severe degenerative joint disease of the right knee.   The overall alignment is mild varus. The bone quality appears to be poor for age and reported activity level.  Preoperative templating of the joint replacement has been completed, documented, and submitted to the Operating Room personnel in order to optimize intra-operative equipment management.  Assessment: FAILED TOTAL KNEE RIGHT Active Problems:   * No active hospital problems. *   Plan: Plan for Procedure(s): TOTAL KNEE ARTHROPLASTY WITH REVISION COMPONENTS  The patient history, physical exam, clinical judgement of the provider and imaging are consistent with end stage degenerative joint disease and total joint arthroplasty revision is deemed medically necessary. The treatment options including medical management, injection therapy, and arthroplasty were discussed at length. The risks and benefits of Procedure(s): TOTAL KNEE ARTHROPLASTY WITH REVISION COMPONENTS were presented and reviewed.  The risks of nonoperative treatment, versus surgical intervention including but not limited to continued pain, aseptic loosening, stiffness, dislocation/subluxation, infection, bleeding, nerve injury, blood clots,  cardiopulmonary complications, morbidity, mortality, among others were discussed. The patient verbalizes understanding and wishes to proceed with the plan.  Patient is being admitted for inpatient treatment for surgery, pain control, PT, prophylactic antibiotics, VTE prophylaxis, progressive ambulation, ADL's and discharge planning.   Dental prophylaxis discussed and recommended for 2 years postoperatively.  The patient does meet the criteria for TXA which will be used perioperatively.   Her normal daily Eliquis will be used postoperatively for DVT prophylaxis in addition to SCDs, and early ambulation. Plan for Tylenol and Oxycodone for pain.    Miralax for constipation prevention. Pharmacy- [facility] on [address] The patient is planning to be discharged home with HHPT ([facility]) and into the care of her daughter Varney Biles who can be reached at [phone] Follow up appt 12/22/21 at 4:30pm   Anticipated LOS equal to or greater than 2 midnights due to - Age 84 and older with one or more of the following:  - Obesity  - Expected need for hospital services (PT, OT, Nursing) required for safe  discharge  - Anticipated need for postoperative skilled nursing care or inpatient rehab  - Active co-morbidities: Diabetes, Coronary Artery Disease, Anemia, and CKD OR   - Unanticipated findings during/Post Surgery: Infected knee joint or operative site  - Patient is a high risk of re-admission due to: previous right knee joint infection     Alisa Graff Office [phone] 11/20/2021 4:07 PM

## 2021-11-22 DIAGNOSIS — T8453XD Infection and inflammatory reaction due to internal right knee prosthesis, subsequent encounter: Secondary | ICD-10-CM | POA: Diagnosis not present

## 2021-11-23 ENCOUNTER — Other Ambulatory Visit (HOSPITAL_COMMUNITY): Payer: Self-pay

## 2021-11-23 ENCOUNTER — Other Ambulatory Visit (HOSPITAL_COMMUNITY): Payer: Self-pay | Admitting: *Deleted

## 2021-11-23 MED ORDER — APIXABAN 5 MG PO TABS: 5.0000 mg | ORAL_TABLET | Freq: Two times a day (BID) | ORAL | 11 refills | Status: DC

## 2021-11-23 MED ORDER — ATORVASTATIN CALCIUM 80 MG PO TABS: 80.0000 mg | ORAL_TABLET | Freq: Every day | ORAL | 3 refills | Status: DC

## 2021-11-23 NOTE — Progress Notes (Signed)
Paramedicine Encounter    Patient ID: Danielle [name], female    DOB: Nov 25, 1945, 76 y.o.   MRN: [id number]  Arrived for visit with Adonna in the home for a medication review and pill box refill.   She had no complaints today and reports feeling okay- just stressed about her upcoming knee surgery in July.    Today we reviewed medications and I filled two pill boxes for two weeks.   She has a few refills needed: NEXT WEEK- Carvedilol  Losartan   THIS WEEK- Atorvastatin Eliquis   We reviewed recent visit with ortho who will be preforming her knee surgery on 7/21. She will need to be off vitamins one week prior to surgery and Eliquis 3 days prior. I will ensure this is carried out at our next home visit in two weeks.   She agreed with plan.   We reviewed all upcoming appointments and confirmed same.   I will see Mi[place]or in two weeks.   [name], [place] 11/23/2021       Patient Care Team: Seward Carol, MD as PCP - General (Internal Medicine) Constance Haw, MD as PCP - Electrophysiology (Cardiology) Sueanne Margarita, MD as PCP - Sleep Medicine (Cardiology) Bensimhon, Shaune Pascal, MD as PCP - Cardiology (Cardiology) Jorge Ny, LCSW as Social Worker (Licensed Clinical Social Worker) Shamleffer, Melanie Crazier, MD as Consulting Physician (Endocrinology)  Patient Active Problem List   Diagnosis Date Noted   Pseudophakia of both eyes 11/15/2021   DVT (deep venous thrombosis) ([place]) 07/31/2021   Acute blood loss anemia    Septic Arthritis, Infection of prosthetic right knee joint ([place]), MSSA infection 47/82/9562   Complication of internal right knee prosthesis ([place]) 07/10/2021   CAD (coronary artery disease) 06/08/2021   Carotid artery disease ([place]) 06/08/2021   Symptomatic bradycardia 06/08/2021   Paroxysmal atrial fibrillation ([place]) 06/08/2021   CKD (chronic kidney disease), stage III ([place]) 06/08/2021   Polyneuropathy associated with  underlying disease ([place]) 05/04/2020   Severe nonproliferative diabetic retinopathy of left eye, with macular edema, associated with type 2 diabetes mellitus ([place]) 11/05/2019   Severe nonproliferative diabetic retinopathy of right eye, with macular edema, associated with type 2 diabetes mellitus ([place]) 11/05/2019   Retinal hemorrhage of right eye 11/05/2019   Retinal hemorrhage of left eye 11/05/2019   Diabetes mellitus ([place]) 05/14/2019   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, with long-term current use of insulin ([place]) 05/14/2019   Type 2 diabetes mellitus with diabetic polyneuropathy, with long-term current use of insulin ([place]) 05/14/2019   History of gout 05/20/2018   Primary localized osteoarthritis of right knee 05/13/2018   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) 12/20/2017   Obesity (BMI 30-39.9) 11/11/2017   OSA (obstructive sleep apnea) 02/18/2017   Essential hypertension    L Breast Cancer    Hypothyroidism     Current Outpatient Medications:    acetaminophen (TYLENOL) 500 MG tablet, Take 500-1,000 mg by mouth every 6 (six) hours as needed (for pain or headaches)., Disp: , Rfl:    allopurinol (ZYLOPRIM) 100 MG tablet, Take 100 mg by mouth daily., Disp: , Rfl:    apixaban (ELIQUIS) 5 MG TABS tablet, Take 1 tablet (5 mg total) by mouth 2 (two) times daily., Disp: 60 tablet, Rfl: 11   atorvastatin (LIPITOR) 80 MG tablet, Take 1 tablet (80 mg total) by mouth at bedtime., Disp: 90 tablet, Rfl: 3   Biotin 10 MG TABS, Take 10 mg by mouth daily., Disp: ,  Rfl:    Blood Glucose Monitoring Suppl (ONETOUCH VERIO) w/Device KIT, Use to check blood sugar, Disp: 1 kit, Rfl: 0   carvedilol (COREG) 3.125 MG tablet, Take 1 tablet (3.125 mg total) by mouth 2 (two) times daily with a meal., Disp: 60 tablet, Rfl: 6   cefadroxil (DURICEF) 1 g tablet, Take 1 tablet (1 g total) by mouth 2 (two) times daily. (Patient not taking: Reported on 10/26/2021), Disp: 180 tablet, Rfl: 0    clotrimazole-betamethasone (LOTRISONE) lotion, Apply 1 application topically 2 (two) times daily as needed (itching)., Disp: , Rfl:    Continuous Blood Gluc Sensor (DEXCOM G6 SENSOR) MISC, 1 Device by Does not apply route as directed., Disp: 9 each, Rfl: 3   Continuous Blood Gluc Transmit (DEXCOM G6 TRANSMITTER) MISC, 1 Device by Does not apply route as directed., Disp: 1 each, Rfl: 3   empagliflozin (JARDIANCE) 10 MG TABS tablet, Take 10 mg by mouth daily., Disp: , Rfl:    Ferrous Sulfate (IRON) 325 (65 Fe) MG TABS, Take 1 tablet (325 mg total) by mouth 2 (two) times daily before a meal. (Patient not taking: Reported on 11/06/2021), Disp: , Rfl:    folic acid (FOLVITE) 1 MG tablet, Take 1 tablet (1 mg total) by mouth daily., Disp: 30 tablet, Rfl: 2   gabapentin (NEURONTIN) 300 MG capsule, Take 300 mg by mouth 2 (two) times daily., Disp: , Rfl:    Insulin Glargine (BASAGLAR KWIKPEN) 100 UNIT/ML, Inject 45 Units into the skin at bedtime., Disp: 45 mL, Rfl: 0   insulin lispro (HUMALOG) 100 UNIT/ML injection, Inject 0.05 mLs (5 Units total) into the skin 3 (three) times daily before meals. (Patient not taking: Reported on 11/06/2021), Disp: 10 mL, Rfl: 0   latanoprost (XALATAN) 0.005 % ophthalmic solution, Place 1 drop into both eyes at bedtime., Disp: , Rfl:    levothyroxine (SYNTHROID, LEVOTHROID) 75 MCG tablet, Take 75 mcg by mouth daily before breakfast., Disp: , Rfl:    losartan (COZAAR) 25 MG tablet, Take 1 tablet (25 mg total) by mouth daily., Disp: 30 tablet, Rfl: 6   LUMIGAN 0.01 % SOLN, Place 1 drop into both eyes at bedtime., Disp: , Rfl:    Multiple Vitamins-Minerals (ALIVE ONCE DAILY WOMENS 50+ PO), Take 1 tablet by mouth daily., Disp: , Rfl:    polyethylene glycol (MIRALAX / GLYCOLAX) 17 g packet, Take 17 g by mouth daily. (Patient not taking: Reported on 10/31/2021), Disp: 14 each, Rfl: 0   senna (SENOKOT) 8.6 MG TABS tablet, Take 1 tablet (8.6 mg total) by mouth 2 (two) times daily. For  constipation. (Patient not taking: Reported on 10/31/2021), Disp: , Rfl: 0   spironolactone (ALDACTONE) 25 MG tablet, Take 0.5 tablets (12.5 mg total) by mouth daily., Disp: 90 tablet, Rfl: 0   torsemide (DEMADEX) 20 MG tablet, Take 1 tablet (20 mg total) by mouth as needed., Disp: 30 tablet, Rfl: 11   Travoprost, BAK Free, (TRAVATAN) 0.004 % SOLN ophthalmic solution, Place 1 drop into both eyes at bedtime., Disp: , Rfl:  Allergies  Allergen Reactions   Other Other (See Comments)    Blood Product Refusal (Jehovah's witness)     Tape Other (See Comments)    Leaves blisters and marks on the skin   Sulfa Antibiotics Rash   Uloric [Febuxostat] Rash     Social History   Socioeconomic History   Marital status: Widowed    Spouse name: Not on file   Number of children: 3  Years of education: Not on file   Highest education level: Some college, no degree  Occupational History   Occupation: retired  Tobacco Use   Smoking status: Former    Packs/day: 1.00    Years: 22.00    Total pack years: 22.00    Types: Cigarettes    Quit date: 40    Years since quitting: 40.5   Smokeless tobacco: Never  Vaping Use   Vaping Use: Never used  Substance and Sexual Activity   Alcohol use: Yes    Comment: 05/22/2018 "glass of wine couple times/wk"   Drug use: Not Currently   Sexual activity: Not Currently    Birth control/protection: None, Post-menopausal  Other Topics Concern   Not on file  Social History Narrative   Not on file   Social Determinants of Health   Financial Resource Strain: Low Risk  (05/08/2018)   Overall Financial Resource Strain (CARDIA)    Difficulty of Paying Living Expenses: Not hard at all  Food Insecurity: No Food Insecurity (05/08/2018)   Hunger Vital Sign    Worried About Running Out of Food in the Last Year: Never true    [place] in the Last Year: Never true  Transportation Needs: No Transportation Needs (05/08/2018)   PRAPARE - Armed forces logistics/support/administrative officer (Medical): No    Lack of Transportation (Non-Medical): No  Physical Activity: Not on file  Stress: Not on file  Social Connections: Not on file  Intimate Partner Violence: Not on file    Physical Exam      Future Appointments  Date Time Provider [place]  12/07/2021  1:00 PM [facility] PAT 4 [facility] None  12/12/2021 11:30 AM [facility] PA/NP [facility] None  01/09/2022 10:45 AM Rankin, Clent Demark, MD [facility] None  01/31/2022  7:00 AM [facility] DEVICE REMOTES [facility]  02/02/2022 11:45 AM Benay Pike, MD [facility] None  02/21/2022 11:00 AM Bensimhon, Shaune Pascal, MD [facility] None  05/02/2022  7:00 AM [facility] DEVICE REMOTES [facility]     ACTION: Home visit completed

## 2021-11-24 ENCOUNTER — Telehealth (HOSPITAL_COMMUNITY): Payer: Self-pay | Admitting: Pharmacy Technician

## 2021-11-24 ENCOUNTER — Telehealth (HOSPITAL_COMMUNITY): Payer: Self-pay | Admitting: *Deleted

## 2021-11-24 NOTE — Telephone Encounter (Signed)
Pt's daughter states she is in donut hole and can not afford Eliquis, samples provided, will send mess to pharmacy team to look into pt assistance  Medication Samples have been provided to the patient.  Drug name: Eliquis       Strength: '5mg'$         Qty: 2  LOT: [id number]  Exp.Date: 08/2023  Dosing instructions: take 1 tab Twice daily   The patient has been instructed regarding the correct time, dose, and frequency of taking this medication, including desired effects and most common side effects.   Avigail Pilling 3:25 PM 11/24/2021

## 2021-11-24 NOTE — Telephone Encounter (Signed)
Advanced Heart Failure Patient Advocate Encounter  Spoke with patient regarding Eliquis assistance. Sent application via docusign. Patient is aware of POI and OOP requirement.

## 2021-12-05 ENCOUNTER — Other Ambulatory Visit (HOSPITAL_COMMUNITY): Payer: Self-pay

## 2021-12-05 DIAGNOSIS — H6123 Impacted cerumen, bilateral: Secondary | ICD-10-CM | POA: Diagnosis not present

## 2021-12-05 DIAGNOSIS — R059 Cough, unspecified: Secondary | ICD-10-CM | POA: Diagnosis not present

## 2021-12-05 DIAGNOSIS — H6063 Unspecified chronic otitis externa, bilateral: Secondary | ICD-10-CM | POA: Diagnosis not present

## 2021-12-05 DIAGNOSIS — J329 Chronic sinusitis, unspecified: Secondary | ICD-10-CM | POA: Diagnosis not present

## 2021-12-05 NOTE — Progress Notes (Signed)
Paramedicine Encounter    Patient ID: Danielle [name], female    DOB: January 23, 1946, 76 y.o.   MRN: [id number]   Arrived to see Danielle [name] for a med rec today. She reports not feeling well as she has a sinus infection and was evaluated by PCP today for same. She was prescribed Amoxicillin and Ear Drops today and told to take Flonase OTC. She will plan to pick these up today at [facility].   She denied any increased shortness of breath, dizziness or chest pain. She has been med compliant over the last two weeks.  I reviewed meds and confirmed same. I filled pill box for one week. She is needing to add Carvedilol and Losartan once picked up from pharmacy. Note with instructions left for her to complete. She is capable of same.   I replaced her Dexcom today- will need to be switched in 10 days.   She is needing appointment with [facility]- she has not been successful at getting scheduled. I will attempt to assist with same.   Knee surgery planned for 7/26- will hold Eliquis starting 7/22.   Eliquis patient assistance will be completed. Danielle [name] has a note that she  sent docusign- however patient isn't great with technology. I will have her sign paper application and complete process to turn in. I forwarded POI to Danielle [name] while Danielle [name] is out of office and advised Danielle [name] to pick up OOP list from Danielle [name] today when she goes to pick up prescriptions. She agreed with plan. I will have her sign application next week and obtain OOP to turn in all together.   Home visit complete. [name].   Refills: Carevedilol Losartan  Jardiance ( I will call [facility] for same)   [name], EMT-Paramedic [phone] 12/05/2021      ACTION: Home visit completed

## 2021-12-06 ENCOUNTER — Encounter: Payer: Self-pay | Admitting: Cardiology

## 2021-12-06 ENCOUNTER — Telehealth (HOSPITAL_COMMUNITY): Payer: Self-pay

## 2021-12-06 NOTE — Progress Notes (Signed)
COVID Vaccine Completed: yes x3  Date of COVID positive in last 90 days:  PCP - Seward Carol, MD Cardiologist - Kathrynn Humble, MD Electrophysiologist- Allegra Lai, MD  Chest x-ray - 07/15/21 Epic EKG - 10/31/21 Epic Stress Test - 01/31/16 Epic  ECHO - 07/18/21 Epic Cardiac Cath - 02/13/16 Epic Pacemaker/ICD device last checked: 10/31/21 Epic Spinal Cord Stimulator:  Bowel Prep -   Sleep Study -  CPAP -   Fasting Blood Sugar -  Checks Blood Sugar _____ times a day  Blood Thinner Instructions: Eliquis  Aspirin Instructions: Last Dose:  Activity level:  Can go up a flight of stairs and perform activities of daily living without stopping and without symptoms of chest pain or shortness of breath.  Able to exercise without symptoms  Unable to go up a flight of stairs without symptoms of     Anesthesia review: HTN, HFrEF, CAD, DVT, DM2, OSA, pacemaker  Patient denies shortness of breath, fever, cough and chest pain at PAT appointment  Patient verbalized understanding of instructions that were given to them at the PAT appointment. Patient was also instructed that they will need to review over the PAT instructions again at home before surgery.

## 2021-12-06 NOTE — Telephone Encounter (Signed)
Left message for Kymber advising her I spoke to [facility] for her Vania Rea and they report it was delivered to the mailbox on July 6th. I will follow up to make sure she got it.   [name], [place] 12/06/2021

## 2021-12-06 NOTE — Patient Instructions (Signed)
DUE TO COVID-19 ONLY TWO VISITORS  (aged 75 and older)  ARE ALLOWED TO COME WITH YOU AND STAY IN THE WAITING ROOM ONLY DURING PRE OP AND PROCEDURE.   **NO VISITORS ARE ALLOWED IN THE SHORT STAY AREA OR RECOVERY ROOM!!**  IF YOU WILL BE ADMITTED INTO THE HOSPITAL YOU ARE ALLOWED ONLY FOUR SUPPORT PEOPLE DURING VISITATION HOURS ONLY (7 AM -8PM)   The support person(s) must pass our screening, gel in and out, and wear a mask at all times, including in the patient's room. Patients must also wear a mask when staff or their support person are in the room. Visitors GUEST BADGE MUST BE WORN VISIBLY  One adult visitor may remain with you overnight and MUST be in the room by 8 P.M.     Your procedure is scheduled on: 12/20/21   Report to [facility] Main Entrance    Report to admitting at 9:45 AM   Call this number if you have problems the morning of surgery [phone]   Do not eat food :After Midnight.   After Midnight you may have the following liquids until 9:15 AM DAY OF SURGERY  Water Black Coffee (sugar ok, NO MILK/CREAM OR CREAMERS)  Tea (sugar ok, NO MILK/CREAM OR CREAMERS) regular and decaf                             Plain Jell-O (NO RED)                                           Fruit ices (not with fruit pulp, NO RED)                                     Popsicles (NO RED)                                                                  Juice: apple, WHITE grape, WHITE cranberry Sports drinks like Gatorade (NO RED) Clear broth(vegetable,chicken,beef)               The day of surgery:  Drink ONE (1) Pre-Surgery G2 at 9:15 AM the morning of surgery. Drink in one sitting. Do not sip.  This drink was given to you during your hospital  pre-op appointment visit. Nothing else to drink after completing the  Pre-Surgery G2.          If you have questions, please contact your surgeon's office.   FOLLOW BOWEL PREP AND ANY ADDITIONAL PRE OP INSTRUCTIONS YOU RECEIVED FROM  YOUR SURGEON'S OFFICE!!!     Oral Hygiene is also important to reduce your risk of infection.                                    Remember - BRUSH YOUR TEETH THE MORNING OF SURGERY WITH YOUR REGULAR TOOTHPASTE   Do NOT smoke after Midnight   Take these medicines the morning of surgery with A SIP  OF WATER: Tylenol, Allopurinol, Carvedilol, Gabapentin, Levothyroxine   These are anesthesia recommendations for holding your anticoagulants.  Please contact your prescribing physician to confirm IF it is safe to hold your anticoagulants for this length of time.   Eliquis Apixaban   72 hours   Xarelto Rivaroxaban   72 hours  Plavix Clopidogrel   120 hours  Pletal Cilostazol   120 hours     DO NOT TAKE ANY ORAL DIABETIC MEDICATIONS DAY OF YOUR SURGERY  How to Manage Your Diabetes Before and After Surgery  Why is it important to control my blood sugar before and after surgery? Improving blood sugar levels before and after surgery helps healing and can limit problems. A way of improving blood sugar control is eating a healthy diet by:  Eating less sugar and carbohydrates  Increasing activity/exercise  Talking with your doctor about reaching your blood sugar goals High blood sugars (greater than 180 mg/dL) can raise your risk of infections and slow your recovery, so you will need to focus on controlling your diabetes during the weeks before surgery. Make sure that the doctor who takes care of your diabetes knows about your planned surgery including the date and location.  How do I manage my blood sugar before surgery? Check your blood sugar at least 4 times a day, starting 2 days before surgery, to make sure that the level is not too high or low. Check your blood sugar the morning of your surgery when you wake up and every 2 hours until you get to the Short Stay unit. If your blood sugar is less than 70 mg/dL, you will need to treat for low blood sugar: Do not take insulin. Treat a low blood  sugar (less than 70 mg/dL) with  cup of clear juice (cranberry or apple), 4 glucose tablets, OR glucose gel. Recheck blood sugar in 15 minutes after treatment (to make sure it is greater than 70 mg/dL). If your blood sugar is not greater than 70 mg/dL on recheck, call [phone] for further instructions. Report your blood sugar to the short stay nurse when you get to Short Stay.  If you are admitted to the hospital after surgery: Your blood sugar will be checked by the staff and you will probably be given insulin after surgery (instead of oral diabetes medicines) to make sure you have good blood sugar levels. The goal for blood sugar control after surgery is 80-180 mg/dL.   WHAT DO I DO ABOUT MY DIABETES MEDICATION?  Do not take oral diabetes medicines (pills) the morning of surgery.  THE DAY BEFORE SURGERY, do not take Jardiance. Take 50% of insulin       THE MORNING OF SURGERY, do not take Jardiance or insulin  The day of surgery, do not take other diabetes injectables, including Byetta (exenatide), Bydureon (exenatide ER), Victoza (liraglutide), or Trulicity (dulaglutide).  If your CBG is greater than 220 mg/dL, you may take  of your sliding scale  (correction) dose of insulin.    For patients with insulin pumps: Contact your diabetes doctor for specific instructions before surgery. Decrease basal rates by 20% at midnight the night before your surgery. Note that if your surgery is planned to be longer than 2 hours, your insulin pump will be removed and intravenous (IV) insulin will be started and managed by the nurses and the anesthesiologist. You will be able to restart your insulin pump once you are awake and able to manage it.  Make sure to bring insulin  pump supplies to the hospital with you in case the  site needs to be changed.  Reviewed and Endorsed by [facility] Patient Education Committee, August 2015   Bring CPAP mask and tubing day of surgery.                               You may not have any metal on your body including hair pins, jewelry, and body piercing             Do not wear make-up, lotions, powders, perfumes, or deodorant  Do not wear nail polish including gel and S&S, artificial/acrylic nails, or any other type of covering on natural nails including finger and toenails. If you have artificial nails, gel coating, etc. that needs to be removed by a nail salon please have this removed prior to surgery or surgery may need to be canceled/ delayed if the surgeon/ anesthesia feels like they are unable to be safely monitored.   Do not shave  48 hours prior to surgery.    Do not bring valuables to the hospital. [place].   Contacts, dentures or bridgework may not be worn into surgery.   Bring small overnight bag day of surgery.   DO NOT [place]. PHARMACY WILL DISPENSE MEDICATIONS LISTED ON YOUR MEDICATION LIST TO YOU DURING YOUR ADMISSION [place]!   Special Instructions: Bring a copy of your healthcare power of attorney and living will documents         the day of surgery if you haven't scanned them before.              Please read over the following fact sheets you were given: IF YOU HAVE QUESTIONS ABOUT YOUR PRE-OP INSTRUCTIONS PLEASE CALL [place] - Preparing for Surgery Before surgery, you can play an important role.  Because skin is not sterile, your skin needs to be as free of germs as possible.  You can reduce the number of germs on your skin by washing with CHG (chlorahexidine gluconate) soap before surgery.  CHG is an antiseptic cleaner which kills germs and bonds with the skin to continue killing germs even after washing. Please DO NOT use if you have an allergy to CHG or antibacterial soaps.  If your skin becomes reddened/irritated stop using the CHG and inform your nurse when you arrive at Short Stay. Do not shave  (including legs and underarms) for at least 48 hours prior to the first CHG shower.  You may shave your face/neck.  Please follow these instructions carefully:  1.  Shower with CHG Soap the night before surgery and the  morning of surgery.  2.  If you choose to wash your hair, wash your hair first as usual with your normal  shampoo.  3.  After you shampoo, rinse your hair and body thoroughly to remove the shampoo.                             4.  Use CHG as you would any other liquid soap.  You can apply chg directly to the skin and wash.  Gently with a scrungie or clean washcloth.  5.  Apply the CHG Soap to your body ONLY  FROM THE NECK DOWN.   Do   not use on face/ open                           Wound or open sores. Avoid contact with eyes, ears mouth and   genitals (private parts).                       Wash face,  Genitals (private parts) with your normal soap.             6.  Wash thoroughly, paying special attention to the area where your    surgery  will be performed.  7.  Thoroughly rinse your body with warm water from the neck down.  8.  DO NOT shower/wash with your normal soap after using and rinsing off the CHG Soap.                9.  Pat yourself dry with a clean towel.            10.  Wear clean pajamas.            11.  Place clean sheets on your bed the night of your first shower and do not  sleep with pets. Day of Surgery : Do not apply any lotions/deodorants the morning of surgery.  Please wear clean clothes to the hospital/surgery center.  FAILURE TO FOLLOW THESE INSTRUCTIONS MAY RESULT IN THE CANCELLATION OF YOUR SURGERY  PATIENT SIGNATURE_________________________________  NURSE SIGNATURE__________________________________  ________________________________________________________________________  WHAT IS A BLOOD TRANSFUSION? Blood Transfusion Information  A transfusion is the replacement of blood or some of its parts. Blood is made up of multiple cells which provide  different functions. Red blood cells carry oxygen and are used for blood loss replacement. White blood cells fight against infection. Platelets control bleeding. Plasma helps clot blood. Other blood products are available for specialized needs, such as hemophilia or other clotting disorders. BEFORE THE TRANSFUSION  Who gives blood for transfusions?  Healthy volunteers who are fully evaluated to make sure their blood is safe. This is blood bank blood. Transfusion therapy is the safest it has ever been in the practice of medicine. Before blood is taken from a donor, a complete history is taken to make sure that person has no history of diseases nor engages in risky social behavior (examples are intravenous drug use or sexual activity with multiple partners). The donor's travel history is screened to minimize risk of transmitting infections, such as malaria. The donated blood is tested for signs of infectious diseases, such as HIV and hepatitis. The blood is then tested to be sure it is compatible with you in order to minimize the chance of a transfusion reaction. If you or a relative donates blood, this is often done in anticipation of surgery and is not appropriate for emergency situations. It takes many days to process the donated blood. RISKS AND COMPLICATIONS Although transfusion therapy is very safe and saves many lives, the main dangers of transfusion include:  Getting an infectious disease. Developing a transfusion reaction. This is an allergic reaction to something in the blood you were given. Every precaution is taken to prevent this. The decision to have a blood transfusion has been considered carefully by your caregiver before blood is given. Blood is not given unless the benefits outweigh the risks. AFTER THE TRANSFUSION Right after receiving a blood transfusion, you will usually feel much better and more  energetic. This is especially true if your red blood cells have gotten low (anemic).  The transfusion raises the level of the red blood cells which carry oxygen, and this usually causes an energy increase. The nurse administering the transfusion will monitor you carefully for complications. HOME CARE INSTRUCTIONS  No special instructions are needed after a transfusion. You may find your energy is better. Speak with your caregiver about any limitations on activity for underlying diseases you may have. SEEK MEDICAL CARE IF:  Your condition is not improving after your transfusion. You develop redness or irritation at the intravenous (IV) site. SEEK IMMEDIATE MEDICAL CARE IF:  Any of the following symptoms occur over the next 12 hours: Shaking chills. You have a temperature by mouth above 102 F (38.9 C), not controlled by medicine. Chest, back, or muscle pain. People around you feel you are not acting correctly or are confused. Shortness of breath or difficulty breathing. Dizziness and fainting. You get a rash or develop hives. You have a decrease in urine output. Your urine turns a dark color or changes to pink, red, or brown. Any of the following symptoms occur over the next 10 days: You have a temperature by mouth above 102 F (38.9 C), not controlled by medicine. Shortness of breath. Weakness after normal activity. The white part of the eye turns yellow (jaundice). You have a decrease in the amount of urine or are urinating less often. Your urine turns a dark color or changes to pink, red, or brown. Document Released: 05/11/2000 Document Revised: 08/06/2011 Document Reviewed: 12/29/2007 [facility] Patient Information 2014 [place], [place].  _______________________________________________________________________

## 2021-12-06 NOTE — Progress Notes (Signed)
PERIOPERATIVE PRESCRIPTION FOR IMPLANTED CARDIAC DEVICE PROGRAMMING  Patient Information: Name:  Danielle [name]  DOB:  Jun 30, 1945  MRN:  [id number]    Planned Procedure:  right total knee arthroplasty with revision  Surgeon:  Dr. Zachery Dakins  Date of Procedure:  12/20/21  Cautery will be used.  Position during surgery:  unknown   Please send documentation back to:  [facility] (Fax # [phone])   Device Information:  Clinic EP Physician:  Allegra Lai, MD   Device Type:  Pacemaker Manufacturer and Phone #:  Medtronic: [id number] Pacemaker Dependent?:  No. Date of Last Device Check:  11/14/2021 Normal Device Function?:  Yes.    Electrophysiologist's Recommendations:  Have magnet available. Provide continuous ECG monitoring when magnet is used or reprogramming is to be performed.  Procedure should not interfere with device function.  No device programming or magnet placement needed.  Per Device Clinic Standing Orders, Wanda Plump, RN  4:55 PM 12/06/2021

## 2021-12-07 ENCOUNTER — Encounter (HOSPITAL_COMMUNITY)
Admission: RE | Admit: 2021-12-07 | Discharge: 2021-12-07 | Disposition: A | Discharge status: Home / Self Care | Payer: Medicare HMO | Source: Ambulatory Visit | Attending: Internal Medicine | Admitting: Internal Medicine

## 2021-12-07 DIAGNOSIS — E1142 Type 2 diabetes mellitus with diabetic polyneuropathy: Secondary | ICD-10-CM

## 2021-12-07 DIAGNOSIS — I10 Essential (primary) hypertension: Secondary | ICD-10-CM

## 2021-12-07 DIAGNOSIS — Z794 Long term (current) use of insulin: Secondary | ICD-10-CM

## 2021-12-07 DIAGNOSIS — Z01818 Encounter for other preprocedural examination: Secondary | ICD-10-CM

## 2021-12-08 NOTE — Pre-Procedure Instructions (Signed)
Surgical Instructions    Your procedure is scheduled on Wednesday, July 26th.  Report to [facility] Main Entrance "A" at 10;15 A.M., then check in with the Admitting office.  Call this number if you have problems the morning of surgery:  [phone]   If you have any questions prior to your surgery date call [phone]: Open Monday-Friday 8am-4pm    Remember:  Do not eat after midnight the night before your surgery  You may drink clear liquids until 9:15 a.m. the morning of your surgery.   Clear liquids allowed are: Water, Non-Citrus Juices (without pulp), Carbonated Beverages, Clear Tea, Black Coffee Only (NO MILK, CREAM OR POWDERED CREAMER of any kind), and Gatorade.  Patient Instructions   The day of surgery (if you have diabetes):  Drink ONE small 12 oz bottle of Gatorade G2 by 9:15 am the morning of surgery This bottle was given to you during your hospital  pre-op appointment visit.  Nothing else to drink after completing the  Small 12 oz bottle of Gatorade G2.         If you have questions, please contact your surgeon's office.     Take these medicines the morning of surgery with A SIP OF WATER  allopurinol (ZYLOPRIM)  carvedilol (COREG) gabapentin (NEURONTIN) levothyroxine (SYNTHROID, LEVOTHROID) Eye drops acetaminophen (TYLENOL)-as needed  Follow your surgeon's instructions on when to stop apixaban (ELIQUIS).  If no instructions were given by your surgeon then you will need to call the office to get those instructions.    As of today, STOP taking any Aspirin (unless otherwise instructed by your surgeon) Aleve, Naproxen, Ibuprofen, Motrin, Advil, Goody's, BC's, all herbal medications, fish oil, and all vitamins.  WHAT DO I DO ABOUT MY DIABETES MEDICATION?   Do not take empagliflozin (JARDIANCE) on Wednesday (7/25) or Thursday (7/26) oral diabetes medicines (pills) the morning of surgery.  THE NIGHT BEFORE SURGERY, take 23 units of Insulin Glargine (BASAGLAR  KWIKPEN) insulin.   HOW TO MANAGE YOUR DIABETES BEFORE AND AFTER SURGERY  Why is it important to control my blood sugar before and after surgery? Improving blood sugar levels before and after surgery helps healing and can limit problems. A way of improving blood sugar control is eating a healthy diet by:  Eating less sugar and carbohydrates  Increasing activity/exercise  Talking with your doctor about reaching your blood sugar goals High blood sugars (greater than 180 mg/dL) can raise your risk of infections and slow your recovery, so you will need to focus on controlling your diabetes during the weeks before surgery. Make sure that the doctor who takes care of your diabetes knows about your planned surgery including the date and location.  How do I manage my blood sugar before surgery? Check your blood sugar at least 4 times a day, starting 2 days before surgery, to make sure that the level is not too high or low.  Check your blood sugar the morning of your surgery when you wake up and every 2 hours until you get to the Short Stay unit.  If your blood sugar is less than 70 mg/dL, you will need to treat for low blood sugar: Do not take insulin. Treat a low blood sugar (less than 70 mg/dL) with  cup of clear juice (cranberry or apple), 4 glucose tablets, OR glucose gel. Recheck blood sugar in 15 minutes after treatment (to make sure it is greater than 70 mg/dL). If your blood sugar is not greater than 70 mg/dL on recheck,  call [phone] for further instructions. Report your blood sugar to the short stay nurse when you get to Short Stay.  If you are admitted to the hospital after surgery: Your blood sugar will be checked by the staff and you will probably be given insulin after surgery (instead of oral diabetes medicines) to make sure you have good blood sugar levels. The goal for blood sugar control after surgery is 80-180 mg/dL.                      Do NOT Smoke (Tobacco/Vaping)  for 24 hours prior to your procedure.  If you use a CPAP at night, you may bring your mask/headgear for your overnight stay.   Contacts, glasses, piercing's, hearing aid's, dentures or partials may not be worn into surgery, please bring cases for these belongings.    For patients admitted to the hospital, discharge time will be determined by your treatment team.   Patients discharged the day of surgery will not be allowed to drive home, and someone needs to stay with them for 24 hours.  SURGICAL WAITING ROOM VISITATION Patients having surgery or a procedure may have two support people in the waiting room. These visitors may be switched out with other visitors if needed. Children under the age of 19 must have an adult accompany them who is not the patient. If the patient needs to stay at the hospital during part of their recovery, the visitor guidelines for inpatient rooms apply.  Please refer to the [facility] website for the visitor guidelines for Inpatients (after your surgery is over and you are in a regular room).    Special instructions:   - Preparing For Surgery  Before surgery, you can play an important role. Because skin is not sterile, your skin needs to be as free of germs as possible. You can reduce the number of germs on your skin by washing with CHG (chlorahexidine gluconate) Soap before surgery.  CHG is an antiseptic cleaner which kills germs and bonds with the skin to continue killing germs even after washing.    Oral Hygiene is also important to reduce your risk of infection.  Remember - BRUSH YOUR TEETH THE MORNING OF SURGERY WITH YOUR REGULAR TOOTHPASTE  Please do not use if you have an allergy to CHG or antibacterial soaps. If your skin becomes reddened/irritated stop using the CHG.  Do not shave (including legs and underarms) for at least 48 hours prior to first CHG shower. It is OK to shave your face.  Please follow these instructions carefully.   Shower  the NIGHT BEFORE SURGERY and the MORNING OF SURGERY  If you chose to wash your hair, wash your hair first as usual with your normal shampoo.  After you shampoo, rinse your hair and body thoroughly to remove the shampoo.  Use CHG Soap as you would any other liquid soap. You can apply CHG directly to the skin and wash gently with a scrungie or a clean washcloth.   Apply the CHG Soap to your body ONLY FROM THE NECK DOWN.  Do not use on open wounds or open sores. Avoid contact with your eyes, ears, mouth and genitals (private parts). Wash Face and genitals (private parts)  with your normal soap.   Wash thoroughly, paying special attention to the area where your surgery will be performed.  Thoroughly rinse your body with warm water from the neck down.  DO NOT shower/wash with your normal soap after using  and rinsing off the CHG Soap.  Pat yourself dry with a CLEAN TOWEL.  Wear CLEAN PAJAMAS to bed the night before surgery  Place CLEAN SHEETS on your bed the night before your surgery  DO NOT SLEEP WITH PETS.   Day of Surgery: Take a shower with CHG soap. Do not wear jewelry or makeup Do not wear lotions, powders, perfumes, or deodorant. Do not shave 48 hours prior to surgery.   Do not bring valuables to the hospital.  [facility] is not responsible for any belongings or valuables. Do not wear nail polish, gel polish, artificial nails, or any other type of covering on natural nails (fingers and toes) If you have artificial nails or gel coating that need to be removed by a nail salon, please have this removed prior to surgery. Artificial nails or gel coating may interfere with anesthesia's ability to adequately monitor your vital signs. Wear Clean/Comfortable clothing the morning of surgery Remember to brush your teeth WITH YOUR REGULAR TOOTHPASTE.   Please read over the following fact sheets that you were given.    If you received a COVID test during your pre-op visit  it is  requested that you wear a mask when out in public, stay away from anyone that may not be feeling well and notify your surgeon if you develop symptoms. If you have been in contact with anyone that has tested positive in the last 10 days please notify you surgeon.

## 2021-12-11 ENCOUNTER — Encounter (HOSPITAL_COMMUNITY): Payer: Self-pay

## 2021-12-11 ENCOUNTER — Telehealth (HOSPITAL_COMMUNITY): Payer: Self-pay

## 2021-12-11 ENCOUNTER — Encounter (HOSPITAL_COMMUNITY)
Admission: RE | Admit: 2021-12-11 | Discharge: 2021-12-11 | Disposition: A | Discharge status: Home / Self Care | Payer: Medicare HMO | Source: Ambulatory Visit | Attending: Orthopedic Surgery | Admitting: Orthopedic Surgery

## 2021-12-11 ENCOUNTER — Other Ambulatory Visit: Payer: Self-pay

## 2021-12-11 VITALS — BP 158/76 | HR 82 | Temp 97.7°F | Resp 18 | Ht 64.0 in | Wt 209.5 lb

## 2021-12-11 DIAGNOSIS — Z01812 Encounter for preprocedural laboratory examination: Secondary | ICD-10-CM | POA: Diagnosis present

## 2021-12-11 DIAGNOSIS — G4733 Obstructive sleep apnea (adult) (pediatric): Secondary | ICD-10-CM | POA: Diagnosis not present

## 2021-12-11 DIAGNOSIS — I2510 Atherosclerotic heart disease of native coronary artery without angina pectoris: Secondary | ICD-10-CM | POA: Insufficient documentation

## 2021-12-11 DIAGNOSIS — E1122 Type 2 diabetes mellitus with diabetic chronic kidney disease: Secondary | ICD-10-CM | POA: Insufficient documentation

## 2021-12-11 DIAGNOSIS — N179 Acute kidney failure, unspecified: Secondary | ICD-10-CM | POA: Insufficient documentation

## 2021-12-11 DIAGNOSIS — E119 Type 2 diabetes mellitus without complications: Secondary | ICD-10-CM | POA: Diagnosis not present

## 2021-12-11 DIAGNOSIS — I5042 Chronic combined systolic (congestive) and diastolic (congestive) heart failure: Secondary | ICD-10-CM | POA: Insufficient documentation

## 2021-12-11 DIAGNOSIS — I130 Hypertensive heart and chronic kidney disease with heart failure and stage 1 through stage 4 chronic kidney disease, or unspecified chronic kidney disease: Secondary | ICD-10-CM | POA: Insufficient documentation

## 2021-12-11 DIAGNOSIS — Z950 Presence of cardiac pacemaker: Secondary | ICD-10-CM | POA: Diagnosis not present

## 2021-12-11 DIAGNOSIS — Z794 Long term (current) use of insulin: Secondary | ICD-10-CM | POA: Insufficient documentation

## 2021-12-11 DIAGNOSIS — Z01818 Encounter for other preprocedural examination: Secondary | ICD-10-CM

## 2021-12-11 LAB — BASIC METABOLIC PANEL
Anion gap: 11 (ref 5–15)
BUN: 22 mg/dL (ref 8–23)
CO2: 25 mmol/L (ref 22–32)
Calcium: 9.8 mg/dL (ref 8.9–10.3)
Chloride: 104 mmol/L (ref 98–111)
Creatinine, Ser: 1.17 mg/dL — ABNORMAL HIGH (ref 0.44–1.00)
GFR, Estimated: 49 mL/min — ABNORMAL LOW (ref >60)
Glucose, Bld: 160 mg/dL — ABNORMAL HIGH (ref 70–99)
Potassium: 4.5 mmol/L (ref 3.5–5.1)
Sodium: 140 mmol/L (ref 135–145)

## 2021-12-11 LAB — SURGICAL PCR SCREEN
MRSA, PCR: NEGATIVE
Staphylococcus aureus: NEGATIVE

## 2021-12-11 LAB — CBC
HCT: 35.7 % — ABNORMAL LOW (ref 36.0–46.0)
Hemoglobin: 10.8 g/dL — ABNORMAL LOW (ref 12.0–15.0)
MCH: 20.7 pg — ABNORMAL LOW (ref 26.0–34.0)
MCHC: 30.3 g/dL (ref 30.0–36.0)
MCV: 68.4 fL — ABNORMAL LOW (ref 80.0–100.0)
Platelets: 208 10*3/uL (ref 150–400)
RBC: 5.22 MIL/uL — ABNORMAL HIGH (ref 3.87–5.11)
RDW: 18.1 % — ABNORMAL HIGH (ref 11.5–15.5)
WBC: 8.5 10*3/uL (ref 4.0–10.5)
nRBC: 0 % (ref 0.0–0.2)

## 2021-12-11 LAB — GLUCOSE, CAPILLARY: Glucose-Capillary: 236 mg/dL — ABNORMAL HIGH (ref 70–99)

## 2021-12-11 LAB — HEMOGLOBIN A1C
Hgb A1c MFr Bld: 7.7 % — ABNORMAL HIGH (ref 4.8–5.6)
Mean Plasma Glucose: 174.29 mg/dL

## 2021-12-11 LAB — NO BLOOD PRODUCTS

## 2021-12-11 NOTE — Progress Notes (Addendum)
PCP - Dr. Seward Carol Endocrinologist-Dr. Gala Lewandowsky EP: Dr. Allegra Lai Cardiologist - Dr. Glori Bickers  PPM/ICD -Medtronic Pacemaker Device Orders -   Clinic EP Physician:  Allegra Lai, MD    Device Type:  Pacemaker Manufacturer and Phone #:  Medtronic: [id number] Pacemaker Dependent?:  No. Date of Last Device Check:  11/14/2021       Normal Device Function?:  Yes.     Electrophysiologist's Recommendations:   Have magnet available. Provide continuous ECG monitoring when magnet is used or reprogramming is to be performed.  Procedure should not interfere with device function.  No device programming or magnet placement needed. Rep Notified - [facility] emailed 12/11/21 at 1215  Chest x-ray - 07/05/21-1 view EKG - 10/31/21 Stress Test - 01/31/16 ECHO - 07/18/21 Cardiac Cath - 02/13/16  Sleep Study - OSA+ CPAP - uses nightly  Fasting Blood Sugar - 65-130 Checks Blood Sugar multiple times a day with dexcom to RUQ of abdomen.  Blood Thinner Instructions: Eliquis, LD will be 12/17/21 Aspirin Instructions: n/a  ERAS Protcol -Clear liquids until 0915 DOS PRE-SURGERY Ensure or G2- G2 provided  COVID TEST- n/a  Anesthesia review: Yes, medical and cardiac history  Patient denies shortness of breath, fever, cough and chest pain at PAT appointment   All instructions explained to the patient, with a verbal understanding of the material. Patient agrees to go over the instructions while at home for a better understanding. Patient also instructed to self quarantine after being tested for COVID-19. The opportunity to ask questions was provided.

## 2021-12-11 NOTE — Telephone Encounter (Signed)
Spoke to [place] to verify meds for Remedios's upcoming procedure. Meds verified and call complete.   [name], Story 12/11/2021

## 2021-12-12 ENCOUNTER — Encounter (HOSPITAL_COMMUNITY): Payer: Medicare HMO

## 2021-12-12 ENCOUNTER — Other Ambulatory Visit (HOSPITAL_COMMUNITY): Payer: Self-pay

## 2021-12-12 DIAGNOSIS — E1165 Type 2 diabetes mellitus with hyperglycemia: Secondary | ICD-10-CM | POA: Diagnosis not present

## 2021-12-12 NOTE — Anesthesia Preprocedure Evaluation (Addendum)
Anesthesia Evaluation  Patient identified by MRN, date of birth, ID band Patient awake    Reviewed: Allergy & Precautions, NPO status , Patient's Chart, lab work & pertinent test results, reviewed documented beta blocker date and time   History of Anesthesia Complications Negative for: history of anesthetic complications  Airway Mallampati: II  TM Distance: >3 FB Neck ROM: Full    Dental  (+) Edentulous Upper, Poor Dentition, Missing, Chipped, Dental Advisory Given   Pulmonary sleep apnea and Continuous Positive Airway Pressure Ventilation , former smoker,    breath sounds clear to auscultation       Cardiovascular hypertension, Pt. on medications and Pt. on home beta blockers (-) angina+ CAD, + Cardiac Stents and +CHF  + pacemaker + Valvular Problems/Murmurs MR and AI  Rhythm:Regular Rate:Normal  06/2021 ECHO: EF 40-45%. The LV has mildly decreased function. Grade II DD, normal RVF, mild-mod MR, mod TR, mild AI   Neuro/Psych Peripheral neuropathy    GI/Hepatic negative GI ROS, Neg liver ROS,   Endo/Other  diabetes (glu 106), Insulin DependentHypothyroidism Morbid obesity  Renal/GU Renal InsufficiencyRenal disease     Musculoskeletal  (+) Arthritis , Osteoarthritis,    Abdominal (+) + obese,   Peds  Hematology  (+) Blood dyscrasia (Hb 10.8), anemia , REFUSES BLOOD PRODUCTS, JEHOVAH'S WITNESSEliquis: last dose sat Will accept cell saver   Anesthesia Other Findings H/o breast cancer  Reproductive/Obstetrics                           Anesthesia Physical Anesthesia Plan  ASA: 3  Anesthesia Plan: General   Post-op Pain Management: Regional block* and Tylenol PO (pre-op)*   Induction:   PONV Risk Score and Plan: 3 and Ondansetron, Dexamethasone and Treatment may vary due to age or medical condition  Airway Management Planned: Oral ETT  Additional Equipment: None  Intra-op Plan:    Post-operative Plan: Extubation in OR  Informed Consent: I have reviewed the patients History and Physical, chart, labs and discussed the procedure including the risks, benefits and alternatives for the proposed anesthesia with the patient or authorized representative who has indicated his/her understanding and acceptance.     Dental advisory given  Plan Discussed with: CRNA and Surgeon  Anesthesia Plan Comments: (See APP note by Durel Salts, FNP  Discussed with patient and her daughter, prefers GA. Plan GETA with femoral nerve block )      Anesthesia Quick Evaluation

## 2021-12-12 NOTE — Progress Notes (Signed)
Paramedicine Encounter    Patient ID: Danielle [name], female    DOB: 02/10/1946, 76 y.o.   MRN: [id number]   Arrived for home visit for Danielle [name] who reports feeling good today stating her sinus infection has cleared up and she is feeling much better. Danielle [name] is preparing for knee surgery next week. She has been seen by pre-surgical screening this week.  Vitals were obtained:  WT- 205lbs CBG- 156 BP- 118/68 HR- 83 O2- 97% RR- 16   Lungs clear, some lower leg edema.   I reviewed meds and confirmed same. She will take her last dose of Amoxicillin tonight. She has been holding all vitamins and minerals since last week and will continue to do so until after surgery.   She has Jardiance in her mailbox in the leasing office but has not picked it up- they will not allow me to pick it up, her daughter plans to take her to get it tomorrow and place it in her pill box.   She was instructed to hold Jardiance the day before her surgery and the day of her surgery.   She is holding Eliquis starting 7/22 untul after her surgery.   I set up pill box accordingly. ( 2 weeks of pill boxes completed)   Dexcom replaced today.   We reviewed all appointments and surgery info today. I plan to follow up with her once she returns home from [facility] ([address]) after surgery discharge.   Home visit complete. She knows to reach out to me if needed.   Heather Spencer, EMT-Paramedic [phone] 12/12/2021       Patient Care Team: Polite, Ronald, MD as PCP - General (Internal Medicine) Camnitz, Will Martin, MD as PCP - Electrophysiology (Cardiology) Turner, Traci R, MD as PCP - Sleep Medicine (Cardiology) Bensimhon, Daniel R, MD as PCP - Cardiology (Cardiology) Uris, Jenna H, LCSW as Social Worker (Licensed Clinical Social Worker) Shamleffer, Ibtehal Jaralla, MD as Consulting Physician (Endocrinology)  Patient Active Problem List   Diagnosis Date Noted   Pseudophakia of  both eyes 11/15/2021   DVT (deep venous thrombosis) (HCC) 07/31/2021   Acute blood loss anemia    Septic Arthritis, Infection of prosthetic right knee joint (HCC), MSSA infection 07/10/2021   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) 07/10/2021   CAD (coronary artery disease) 06/08/2021   Carotid artery disease (HCC) 06/08/2021   Symptomatic bradycardia 06/08/2021   Paroxysmal atrial fibrillation (HCC) 06/08/2021   CKD (chronic kidney disease), stage III (HCC) 06/08/2021   Polyneuropathy associated with underlying disease (HCC) 05/04/2020   Severe nonproliferative diabetic retinopathy of left eye, with macular edema, associated with type 2 diabetes mellitus (HCC) 11/05/2019   Severe nonproliferative diabetic retinopathy of right eye, with macular edema, associated with type 2 diabetes mellitus (HCC) 11/05/2019   Retinal hemorrhage of right eye 11/05/2019   Retinal hemorrhage of left eye 11/05/2019   Diabetes mellitus (HCC) 05/14/2019   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, with long-term current use of insulin (HCC) 05/14/2019   Type 2 diabetes mellitus with diabetic polyneuropathy, with long-term current use of insulin (HCC) 05/14/2019   History of gout 05/20/2018   Primary localized osteoarthritis of right knee 05/13/2018   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) 12/20/2017   Obesity (BMI 30-39.9) 11/11/2017   OSA (obstructive sleep apnea) 02/18/2017   Essential hypertension    L Breast Cancer    Hypothyroidism     Current Outpatient Medications:      acetaminophen (TYLENOL) 500 MG tablet, Take 500-1,000 mg by mouth every 6 (six) hours as needed (for pain or headaches)., Disp: , Rfl:    allopurinol (ZYLOPRIM) 100 MG tablet, Take 100 mg by mouth daily., Disp: , Rfl:    apixaban (ELIQUIS) 5 MG TABS tablet, Take 1 tablet (5 mg total) by mouth 2 (two) times daily., Disp: 60 tablet, Rfl: 11   atorvastatin (LIPITOR) 80 MG tablet, Take 1 tablet (80 mg  total) by mouth at bedtime., Disp: 90 tablet, Rfl: 3   Biotin 10 MG TABS, Take 10 mg by mouth daily., Disp: , Rfl:    Blood Glucose Monitoring Suppl (ONETOUCH VERIO) w/Device KIT, Use to check blood sugar, Disp: 1 kit, Rfl: 0   carvedilol (COREG) 3.125 MG tablet, Take 1 tablet (3.125 mg total) by mouth 2 (two) times daily with a meal., Disp: 60 tablet, Rfl: 6   clotrimazole-betamethasone (LOTRISONE) lotion, Apply 1 application topically 2 (two) times daily as needed (itching)., Disp: , Rfl:    Continuous Blood Gluc Sensor (DEXCOM G6 SENSOR) MISC, 1 Device by Does not apply route as directed., Disp: 9 each, Rfl: 3   Continuous Blood Gluc Transmit (DEXCOM G6 TRANSMITTER) MISC, 1 Device by Does not apply route as directed., Disp: 1 each, Rfl: 3   empagliflozin (JARDIANCE) 10 MG TABS tablet, Take 10 mg by mouth daily., Disp: , Rfl:    Ferrous Sulfate (IRON) 325 (65 Fe) MG TABS, Take 1 tablet (325 mg total) by mouth 2 (two) times daily before a meal., Disp: , Rfl:    folic acid (FOLVITE) 1 MG tablet, Take 1 tablet (1 mg total) by mouth daily., Disp: 30 tablet, Rfl: 2   gabapentin (NEURONTIN) 300 MG capsule, Take 300 mg by mouth 2 (two) times daily., Disp: , Rfl:    Insulin Glargine (BASAGLAR KWIKPEN) 100 UNIT/ML, Inject 45 Units into the skin at bedtime., Disp: 45 mL, Rfl: 0   insulin lispro (HUMALOG) 100 UNIT/ML injection, Inject 0.05 mLs (5 Units total) into the skin 3 (three) times daily before meals., Disp: 10 mL, Rfl: 0   latanoprost (XALATAN) 0.005 % ophthalmic solution, Place 1 drop into both eyes at bedtime., Disp: , Rfl:    levothyroxine (SYNTHROID, LEVOTHROID) 75 MCG tablet, Take 75 mcg by mouth daily before breakfast., Disp: , Rfl:    losartan (COZAAR) 25 MG tablet, Take 1 tablet (25 mg total) by mouth daily., Disp: 30 tablet, Rfl: 6   LUMIGAN 0.01 % SOLN, Place 1 drop into both eyes at bedtime., Disp: , Rfl:    Multiple Vitamins-Minerals (ALIVE ONCE DAILY WOMENS 50+ PO), Take 1 tablet by  mouth daily., Disp: , Rfl:    spironolactone (ALDACTONE) 25 MG tablet, Take 0.5 tablets (12.5 mg total) by mouth daily., Disp: 90 tablet, Rfl: 0   torsemide (DEMADEX) 20 MG tablet, Take 1 tablet (20 mg total) by mouth as needed. (Patient taking differently: Take 20 mg by mouth daily as needed (edema).), Disp: 30 tablet, Rfl: 11   Travoprost, BAK Free, (TRAVATAN) 0.004 % SOLN ophthalmic solution, Place 1 drop into both eyes at bedtime., Disp: , Rfl:  Allergies  Allergen Reactions   Other Other (See Comments)    Blood Product Refusal (Jehovah's witness)     Tape Other (See Comments)    Leaves blisters and marks on the skin   Sulfa Antibiotics Rash   Uloric [Febuxostat] Rash     Social History   Socioeconomic History   Marital status: Widowed      Spouse name: Not on file   Number of children: 3   Years of education: Not on file   Highest education level: Some college, no degree  Occupational History   Occupation: retired  Tobacco Use   Smoking status: Former    Packs/day: 1.00    Years: 22.00    Total pack years: 22.00    Types: Cigarettes    Quit date: 70    Years since quitting: 40.5   Smokeless tobacco: Never  Vaping Use   Vaping Use: Never used  Substance and Sexual Activity   Alcohol use: Yes    Comment: 05/22/2018 "glass of wine couple times/wk"   Drug use: Not Currently   Sexual activity: Not Currently    Birth control/protection: None, Post-menopausal  Other Topics Concern   Not on file  Social History Narrative   Not on file   Social Determinants of Health   Financial Resource Strain: Low Risk  (05/08/2018)   Overall Financial Resource Strain (CARDIA)    Difficulty of Paying Living Expenses: Not hard at all  Food Insecurity: No Food Insecurity (05/08/2018)   Hunger Vital Sign    Worried About Running Out of Food in the Last Year: Never true    [place] in the Last Year: Never true  Transportation Needs: No Transportation Needs (05/08/2018)    PRAPARE - Hydrologist (Medical): No    Lack of Transportation (Non-Medical): No  Physical Activity: Not on file  Stress: Not on file  Social Connections: Not on file  Intimate Partner Violence: Not on file    Physical Exam      Future Appointments  Date Time Provider [place]  01/09/2022 10:45 AM Rankin, Clent Demark, MD [facility] None  01/11/2022  2:30 PM [facility] PA/NP [facility] None  01/31/2022  7:00 AM [facility] DEVICE REMOTES [facility]  02/02/2022 11:45 AM Benay Pike, MD [facility] None  02/21/2022 11:00 AM Bensimhon, Shaune Pascal, MD [facility] None  05/02/2022  7:00 AM [facility] DEVICE REMOTES [facility]     ACTION: Home visit completed

## 2021-12-12 NOTE — Progress Notes (Signed)
Anesthesia Chart Review:   Case: [id number] Date/Time: 12/20/21 1200   Procedure: TOTAL KNEE REVISION (Right: Knee)   Anesthesia type: Spinal   Pre-op diagnosis: FAILED TOTAL KNEE RIGHT   Location: [place] OR ROOM 09 / [facility]   Surgeons: Willaim Sheng, MD       DISCUSSION: Pt is 76 years old with hx CAD (3 overlapping DES to mid LAD 2007; DES x 5 placed to the mid to distal LAD 2016; DES to distal LAD 2017), HFmrEF, ischemic cardiomyopathy (EF was 10% at lowest, now 40-45%)HTN, PAF, symptomatic bradycardia (s/p [employer] pacemaker implanted 2016), DVT, OSA, DM, CKD,   - Hospitalized 2/13-08/02/21 for septic arthritis/infection R knee joint after failing outpatient antibiotics. Complicated by acute blood loss anemia (received 1 unit PRBCs), L brachial vein DVT, Underwent explant right total knee arthroplasty with implant of articulating antibiotic spacer by Dr. Zachery Dakins on 07/12/2021  - Hospitalized 11/17-11/22/22 with R knee septic arthritis. S/p I&D and polyexchange of infected TKA. Complicated by AKI   Pt refuses all blood products  Last dose eliquis 12/17/21  Perioperative prescription for pacemaker:  Have magnet available. Provide continuous ECG monitoring when magnet is used or reprogramming is to be performed.  Procedure should not interfere with device function.  No device programming or magnet placement needed.   VS: BP (!) 158/76   Pulse 82   Temp 36.5 C (Oral)   Resp 18   Ht 5' 4" (1.626 m)   Wt 95 kg   SpO2 97%   BMI 35.96 kg/m   PROVIDERS: -  PCP is Seward Carol, MD - Used to see endocrinologist Collier Flowers, MD - EP cardiologist is Allegra Lai, MD - HF Cardiologist is Glori Bickers, MD. Last office visit 10/31/21 with Allena Katz, NP  LABS: Labs reviewed: Acceptable for surgery.  (all labs ordered are listed, but only abnormal results are displayed)  Labs Reviewed  GLUCOSE, CAPILLARY - Abnormal; Notable for the following components:       Result Value   Glucose-Capillary 236 (*)    All other components within normal limits  HEMOGLOBIN A1C - Abnormal; Notable for the following components:   Hgb A1c MFr Bld 7.7 (*)    All other components within normal limits  BASIC METABOLIC PANEL - Abnormal; Notable for the following components:   Glucose, Bld 160 (*)    Creatinine, Ser 1.17 (*)    GFR, Estimated 49 (*)    All other components within normal limits  CBC - Abnormal; Notable for the following components:   RBC 5.22 (*)    Hemoglobin 10.8 (*)    HCT 35.7 (*)    MCV 68.4 (*)    MCH 20.7 (*)    RDW 18.1 (*)    All other components within normal limits  SURGICAL PCR SCREEN  NO BLOOD PRODUCTS     IMAGES: 1 view CXR 07/15/21:  - Mildly increased diffuse interstitial opacities, possibly developing interstitial edema.  EKG 10/31/21: NSR   CV: Echo 07/18/21:  1. Left ventricular ejection fraction, by estimation, is 40 to 45%. The left ventricle has mildly decreased function. The left ventricle demonstrates regional wall motion abnormalities (see scoring diagram/findings for description). Left ventricular diastolic parameters are consistent with Grade II diastolic dysfunction (pseudonormalization). Elevated left atrial pressure.  2. Right ventricular systolic function is normal. The right ventricular size is normal.  3. Left atrial size was severely dilated.  4. The mitral valve is grossly normal. Mild to moderate mitral valve regurgitation.  The mean mitral valve gradient is 5.5 mmHg with average heart rate of 82 bpm. Moderate mitral annular calcification.  5. Tricuspid valve regurgitation is moderate.  6. The aortic valve is tricuspid. There is mild calcification of the aortic valve. There is mild thickening of the aortic valve. Aortic valve regurgitation is mild. Aortic valve sclerosis/calcification is present, without any evidence of aortic stenosis.  7. The inferior vena cava is dilated in size with <50% respiratory  variability, suggesting right atrial pressure of 15 mmHg.   Cardiac cath 02/13/16:  - LM: Normal - LAD: Multiple layers of previously placed stents were noted in the mid to distal LAD.  D1 and D2 both intermediately sized.  The distal LAD with 99% instent restenosis treated with DES - CX: mid 50% stenosis - RCA: Ostial 25%, mid 25% stenosis. PDA 30% ostial stenosis   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[place]) 02/05/2017   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 05/20/2018   Acute kidney failure ([place]) 05/15/2018   Arthritis    "all over" (05/22/2018)   Blood dyscrasia    per pt-has small blood cells-appears as if anemic   Breast cancer, left breast ([place]) 1985   CHF (congestive heart failure) ([facility])    Complication of anesthesia    difficult to awaken from per pt   Coronary artery disease    Dyspnea    Essential hypertension    Heart disease    Heart murmur    History of gout    Hypothyroidism    Obesity (BMI 30-39.9) 11/11/2017   OSA (obstructive sleep apnea) 02/18/2017    severe obstructive sleep apnea with an AHI of 75.6/h and mild central sleep apnea with a CAI of 7.7/h.  Oxygen saturations dropped as low as 82%.   He is on CPAP at 9 cm H2O.   OSA on CPAP    Personal history of chemotherapy    Personal history of radiation therapy    Presence of permanent cardiac pacemaker    Primary localized osteoarthritis of right knee 05/13/2018   Refusal of blood transfusions as patient is Jehovah's Witness    Type II diabetes mellitus ([place])     Past Surgical History:  Procedure Laterality Date   APPLICATION OF WOUND VAC Right 04/14/2021   Procedure: APPLICATION OF WOUND VAC;  Surgeon: Willaim Sheng, MD;  Location: [facility];  Service: Orthopedics;  Laterality: Right;   BREAST BIOPSY Left 1985   BREAST LUMPECTOMY Left 1985   BREAST LUMPECTOMY WITH NEEDLE LOCALIZATION AND AXILLARY LYMPH NODE  DISSECTION  1985   CATARACT EXTRACTION W/ INTRAOCULAR LENS  IMPLANT, BILATERAL Bilateral    CORONARY ANGIOPLASTY WITH STENT PLACEMENT  2010, 2012,2017    2 done 2010 and 1 replaced 2012 and 2 replaced in 2017   [place]     EXCISIONAL TOTAL KNEE ARTHROPLASTY WITH ANTIBIOTIC SPACERS Right 07/12/2021   Procedure: EXCISIONAL TOTAL KNEE ARTHROPLASTY WITH ANTIBIOTIC SPACERS;  Surgeon: Willaim Sheng, MD;  Location: [facility];  Service: Orthopedics;  Laterality: Right;   I & D KNEE WITH POLY EXCHANGE Right 04/14/2021   Procedure: IRRIGATION AND DEBRIDEMENT KNEE WITH POLY EXCHANGE;  Surgeon: Willaim Sheng, MD;  Location: [place];  Service: Orthopedics;  Laterality: Right;   INSERT / REPLACE / REMOVE PACEMAKER  07/19/2014   JOINT REPLACEMENT     REPLACEMENT TOTAL KNEE Left 09/2012   THYROIDECTOMY, PARTIAL  1978   TONSILLECTOMY     TOTAL KNEE ARTHROPLASTY Right 05/13/2018   Procedure: TOTAL KNEE ARTHROPLASTY;  Surgeon: Marchia Bond, MD;  Location: [facility];  Service: Orthopedics;  Laterality: Right;   TUBAL LIGATION      MEDICATIONS:  acetaminophen (TYLENOL) 500 MG tablet   allopurinol (ZYLOPRIM) 100 MG tablet   apixaban (ELIQUIS) 5 MG TABS tablet   atorvastatin (LIPITOR) 80 MG tablet   Biotin 10 MG TABS   Blood Glucose Monitoring Suppl (ONETOUCH VERIO) w/Device KIT   carvedilol (COREG) 3.125 MG tablet   clotrimazole-betamethasone (LOTRISONE) lotion   Continuous Blood Gluc Sensor (DEXCOM G6 SENSOR) MISC   Continuous Blood Gluc Transmit (DEXCOM G6 TRANSMITTER) MISC   empagliflozin (JARDIANCE) 10 MG TABS tablet   Ferrous Sulfate (IRON) 325 (65 Fe) MG TABS   folic acid (FOLVITE) 1 MG tablet   gabapentin (NEURONTIN) 300 MG capsule   Insulin Glargine (BASAGLAR KWIKPEN) 100 UNIT/ML   insulin lispro (HUMALOG) 100 UNIT/ML injection   latanoprost (XALATAN) 0.005 % ophthalmic solution   levothyroxine (SYNTHROID, LEVOTHROID) 75 MCG tablet   losartan (COZAAR) 25 MG  tablet   LUMIGAN 0.01 % SOLN   Multiple Vitamins-Minerals (ALIVE ONCE DAILY WOMENS 50+ PO)   spironolactone (ALDACTONE) 25 MG tablet   torsemide (DEMADEX) 20 MG tablet   Travoprost, BAK Free, (TRAVATAN) 0.004 % SOLN ophthalmic solution   No current facility-administered medications for this encounter.    If no changes, I anticipate pt can proceed with surgery as scheduled.   Willeen Cass, PhD, FNP-BC [facility] Short Stay [facility]/Anesthesiology Phone: [phone] 12/12/2021 2:26 PM

## 2021-12-20 ENCOUNTER — Inpatient Hospital Stay (HOSPITAL_COMMUNITY): Payer: Medicare HMO | Admitting: Certified Registered Nurse Anesthetist

## 2021-12-20 ENCOUNTER — Inpatient Hospital Stay (HOSPITAL_COMMUNITY)
Admission: RE | Admit: 2021-12-20 | Discharge: 2022-01-03 | DRG: 467 | Disposition: A | Discharge status: Skilled Nursing Facility | Payer: Medicare HMO | Attending: Orthopedic Surgery | Admitting: Orthopedic Surgery

## 2021-12-20 ENCOUNTER — Encounter (HOSPITAL_COMMUNITY): Payer: Self-pay | Admitting: Orthopedic Surgery

## 2021-12-20 ENCOUNTER — Inpatient Hospital Stay (HOSPITAL_COMMUNITY): Payer: Medicare HMO

## 2021-12-20 ENCOUNTER — Encounter (HOSPITAL_COMMUNITY)
Admission: RE | Disposition: A | Discharge status: Skilled Nursing Facility | Payer: Self-pay | Source: Home / Self Care | Attending: Orthopedic Surgery

## 2021-12-20 ENCOUNTER — Inpatient Hospital Stay (HOSPITAL_COMMUNITY): Payer: Medicare HMO | Admitting: Emergency Medicine

## 2021-12-20 DIAGNOSIS — Z853 Personal history of malignant neoplasm of breast: Secondary | ICD-10-CM

## 2021-12-20 DIAGNOSIS — T8453XA Infection and inflammatory reaction due to internal right knee prosthesis, initial encounter: Principal | ICD-10-CM | POA: Diagnosis present

## 2021-12-20 DIAGNOSIS — Z471 Aftercare following joint replacement surgery: Secondary | ICD-10-CM | POA: Diagnosis not present

## 2021-12-20 DIAGNOSIS — Z923 Personal history of irradiation: Secondary | ICD-10-CM

## 2021-12-20 DIAGNOSIS — G629 Polyneuropathy, unspecified: Secondary | ICD-10-CM | POA: Diagnosis not present

## 2021-12-20 DIAGNOSIS — K5900 Constipation, unspecified: Secondary | ICD-10-CM | POA: Diagnosis not present

## 2021-12-20 DIAGNOSIS — Z8249 Family history of ischemic heart disease and other diseases of the circulatory system: Secondary | ICD-10-CM | POA: Diagnosis not present

## 2021-12-20 DIAGNOSIS — T84019A Broken internal joint prosthesis, unspecified site, initial encounter: Secondary | ICD-10-CM | POA: Diagnosis not present

## 2021-12-20 DIAGNOSIS — Z950 Presence of cardiac pacemaker: Secondary | ICD-10-CM | POA: Diagnosis not present

## 2021-12-20 DIAGNOSIS — I2510 Atherosclerotic heart disease of native coronary artery without angina pectoris: Secondary | ICD-10-CM | POA: Diagnosis not present

## 2021-12-20 DIAGNOSIS — Z531 Procedure and treatment not carried out because of patient's decision for reasons of belief and group pressure: Secondary | ICD-10-CM | POA: Diagnosis present

## 2021-12-20 DIAGNOSIS — Z833 Family history of diabetes mellitus: Secondary | ICD-10-CM

## 2021-12-20 DIAGNOSIS — G4733 Obstructive sleep apnea (adult) (pediatric): Secondary | ICD-10-CM | POA: Diagnosis not present

## 2021-12-20 DIAGNOSIS — Z87891 Personal history of nicotine dependence: Secondary | ICD-10-CM | POA: Diagnosis not present

## 2021-12-20 DIAGNOSIS — T84012A Broken internal right knee prosthesis, initial encounter: Secondary | ICD-10-CM | POA: Diagnosis not present

## 2021-12-20 DIAGNOSIS — Y831 Surgical operation with implant of artificial internal device as the cause of abnormal reaction of the patient, or of later complication, without mention of misadventure at the time of the procedure: Secondary | ICD-10-CM | POA: Diagnosis not present

## 2021-12-20 DIAGNOSIS — D62 Acute posthemorrhagic anemia: Secondary | ICD-10-CM | POA: Diagnosis not present

## 2021-12-20 DIAGNOSIS — E669 Obesity, unspecified: Secondary | ICD-10-CM | POA: Diagnosis present

## 2021-12-20 DIAGNOSIS — Z6835 Body mass index (BMI) 35.0-35.9, adult: Secondary | ICD-10-CM | POA: Diagnosis not present

## 2021-12-20 DIAGNOSIS — B9561 Methicillin susceptible Staphylococcus aureus infection as the cause of diseases classified elsewhere: Secondary | ICD-10-CM | POA: Diagnosis not present

## 2021-12-20 DIAGNOSIS — I110 Hypertensive heart disease with heart failure: Secondary | ICD-10-CM | POA: Diagnosis not present

## 2021-12-20 DIAGNOSIS — E1122 Type 2 diabetes mellitus with diabetic chronic kidney disease: Secondary | ICD-10-CM | POA: Diagnosis not present

## 2021-12-20 DIAGNOSIS — E039 Hypothyroidism, unspecified: Secondary | ICD-10-CM | POA: Diagnosis present

## 2021-12-20 DIAGNOSIS — I509 Heart failure, unspecified: Secondary | ICD-10-CM | POA: Diagnosis not present

## 2021-12-20 DIAGNOSIS — Z9221 Personal history of antineoplastic chemotherapy: Secondary | ICD-10-CM | POA: Diagnosis not present

## 2021-12-20 DIAGNOSIS — E119 Type 2 diabetes mellitus without complications: Secondary | ICD-10-CM

## 2021-12-20 DIAGNOSIS — G8918 Other acute postprocedural pain: Secondary | ICD-10-CM | POA: Diagnosis not present

## 2021-12-20 DIAGNOSIS — N183 Chronic kidney disease, stage 3 unspecified: Secondary | ICD-10-CM | POA: Diagnosis not present

## 2021-12-20 DIAGNOSIS — Z96651 Presence of right artificial knee joint: Secondary | ICD-10-CM | POA: Diagnosis not present

## 2021-12-20 DIAGNOSIS — Z807 Family history of other malignant neoplasms of lymphoid, hematopoietic and related tissues: Secondary | ICD-10-CM | POA: Diagnosis not present

## 2021-12-20 DIAGNOSIS — I5042 Chronic combined systolic (congestive) and diastolic (congestive) heart failure: Secondary | ICD-10-CM | POA: Diagnosis present

## 2021-12-20 DIAGNOSIS — I130 Hypertensive heart and chronic kidney disease with heart failure and stage 1 through stage 4 chronic kidney disease, or unspecified chronic kidney disease: Secondary | ICD-10-CM | POA: Diagnosis not present

## 2021-12-20 HISTORY — PX: TOTAL KNEE REVISION: SHX996

## 2021-12-20 LAB — GLUCOSE, CAPILLARY
Glucose-Capillary: 106 mg/dL — ABNORMAL HIGH (ref 70–99)
Glucose-Capillary: 148 mg/dL — ABNORMAL HIGH (ref 70–99)
Glucose-Capillary: 184 mg/dL — ABNORMAL HIGH (ref 70–99)
Glucose-Capillary: 197 mg/dL — ABNORMAL HIGH (ref 70–99)

## 2021-12-20 SURGERY — TOTAL KNEE REVISION
Anesthesia: General | Site: Knee | Laterality: Right

## 2021-12-20 MED ORDER — CEFAZOLIN SODIUM-DEXTROSE 2-4 GM/100ML-% IV SOLN
INTRAVENOUS | Status: AC
  Filled 2021-12-20: qty 100

## 2021-12-20 MED ORDER — SODIUM CHLORIDE (PF) 0.9 % IJ SOLN
INTRAMUSCULAR | Status: DC | PRN
  Administered 2021-12-20: 80 mL

## 2021-12-20 MED ORDER — MIDAZOLAM HCL 2 MG/2ML IJ SOLN
INTRAMUSCULAR | Status: AC
  Administered 2021-12-20: 1 mg
  Filled 2021-12-20: qty 2

## 2021-12-20 MED ORDER — ZOLPIDEM TARTRATE 5 MG PO TABS
5.0000 mg | ORAL_TABLET | Freq: Every evening | ORAL | Status: DC | PRN
  Administered 2021-12-23 – 2022-01-02 (×4): 5 mg via ORAL
  Filled 2021-12-20 (×4): qty 1

## 2021-12-20 MED ORDER — 0.9 % SODIUM CHLORIDE (POUR BTL) OPTIME
TOPICAL | Status: DC | PRN
  Administered 2021-12-20: 1000 mL

## 2021-12-20 MED ORDER — CEFAZOLIN SODIUM-DEXTROSE 2-4 GM/100ML-% IV SOLN
2.0000 g | Freq: Three times a day (TID) | INTRAVENOUS | Status: DC
Start: 2021-12-20 — End: 2022-01-04
  Administered 2021-12-20 – 2022-01-03 (×42): 2 g via INTRAVENOUS
  Filled 2021-12-20 (×44): qty 100

## 2021-12-20 MED ORDER — POVIDONE-IODINE 10 % EX SWAB: 2.0000 | Freq: Once | CUTANEOUS | Status: DC

## 2021-12-20 MED ORDER — DIPHENHYDRAMINE HCL 12.5 MG/5ML PO ELIX
12.5000 mg | ORAL_SOLUTION | ORAL | Status: DC | PRN
  Administered 2021-12-21: 12.5 mg via ORAL
  Administered 2021-12-22: 25 mg via ORAL
  Filled 2021-12-20 (×2): qty 10

## 2021-12-20 MED ORDER — DEXAMETHASONE SODIUM PHOSPHATE 10 MG/ML IJ SOLN
INTRAMUSCULAR | Status: DC | PRN
  Administered 2021-12-20: 5 mg via INTRAVENOUS

## 2021-12-20 MED ORDER — HYDROMORPHONE HCL 1 MG/ML IJ SOLN
0.5000 mg | INTRAMUSCULAR | Status: DC | PRN
  Administered 2021-12-21 – 2021-12-26 (×4): 1 mg via INTRAVENOUS
  Filled 2021-12-20 (×4): qty 1

## 2021-12-20 MED ORDER — BUPIVACAINE LIPOSOME 1.3 % IJ SUSP
INTRAMUSCULAR | Status: AC
  Filled 2021-12-20: qty 20

## 2021-12-20 MED ORDER — ONDANSETRON HCL 4 MG PO TABS: 4.0000 mg | ORAL_TABLET | Freq: Four times a day (QID) | ORAL | Status: DC | PRN

## 2021-12-20 MED ORDER — ROPIVACAINE HCL 7.5 MG/ML IJ SOLN
INTRAMUSCULAR | Status: DC | PRN
  Administered 2021-12-20: 20 mL via PERINEURAL

## 2021-12-20 MED ORDER — SPIRONOLACTONE 12.5 MG HALF TABLET
12.5000 mg | ORAL_TABLET | Freq: Every day | ORAL | Status: DC
  Administered 2021-12-20 – 2022-01-03 (×15): 12.5 mg via ORAL
  Filled 2021-12-20 (×15): qty 1

## 2021-12-20 MED ORDER — CHLORHEXIDINE GLUCONATE 0.12 % MT SOLN
OROMUCOSAL | Status: AC
  Administered 2021-12-20: 15 mL via OROMUCOSAL
  Filled 2021-12-20: qty 15

## 2021-12-20 MED ORDER — LACTATED RINGERS IV SOLN: INTRAVENOUS | Status: DC

## 2021-12-20 MED ORDER — TRANEXAMIC ACID-NACL 1000-0.7 MG/100ML-% IV SOLN
1000.0000 mg | INTRAVENOUS | Status: AC
Start: 2021-12-20 — End: 2021-12-20
  Administered 2021-12-20: 1000 mg via INTRAVENOUS

## 2021-12-20 MED ORDER — SODIUM CHLORIDE 0.9 % IR SOLN
Status: DC | PRN
  Administered 2021-12-20: 500 mL
  Administered 2021-12-20: 3000 mL

## 2021-12-20 MED ORDER — LACTATED RINGERS IV SOLN: INTRAVENOUS | Status: DC | PRN

## 2021-12-20 MED ORDER — ORAL CARE MOUTH RINSE: 15.0000 mL | Freq: Once | OROMUCOSAL | Status: AC

## 2021-12-20 MED ORDER — SUGAMMADEX SODIUM 200 MG/2ML IV SOLN
INTRAVENOUS | Status: DC | PRN
  Administered 2021-12-20: 150 mg via INTRAVENOUS

## 2021-12-20 MED ORDER — HYDROMORPHONE HCL 1 MG/ML IJ SOLN
INTRAMUSCULAR | Status: AC
  Filled 2021-12-20: qty 1

## 2021-12-20 MED ORDER — ALLOPURINOL 100 MG PO TABS
100.0000 mg | ORAL_TABLET | Freq: Every day | ORAL | Status: DC
Start: 2021-12-20 — End: 2022-01-04
  Administered 2021-12-20 – 2022-01-03 (×15): 100 mg via ORAL
  Filled 2021-12-20 (×15): qty 1

## 2021-12-20 MED ORDER — ROCURONIUM BROMIDE 10 MG/ML (PF) SYRINGE
PREFILLED_SYRINGE | INTRAVENOUS | Status: DC | PRN
  Administered 2021-12-20: 70 mg via INTRAVENOUS
  Administered 2021-12-20: 30 mg via INTRAVENOUS

## 2021-12-20 MED ORDER — ONDANSETRON HCL 4 MG/2ML IJ SOLN
INTRAMUSCULAR | Status: DC | PRN
  Administered 2021-12-20: 4 mg via INTRAVENOUS

## 2021-12-20 MED ORDER — MIDAZOLAM HCL 2 MG/2ML IJ SOLN: 0.5000 mg | Freq: Once | INTRAMUSCULAR | Status: DC | PRN

## 2021-12-20 MED ORDER — PROPOFOL 10 MG/ML IV BOLUS
INTRAVENOUS | Status: AC
  Filled 2021-12-20: qty 20

## 2021-12-20 MED ORDER — DEXAMETHASONE SODIUM PHOSPHATE 10 MG/ML IJ SOLN
INTRAMUSCULAR | Status: AC
  Filled 2021-12-20: qty 1

## 2021-12-20 MED ORDER — LIDOCAINE 2% (20 MG/ML) 5 ML SYRINGE
INTRAMUSCULAR | Status: DC | PRN
  Administered 2021-12-20: 30 mg via INTRAVENOUS

## 2021-12-20 MED ORDER — ONDANSETRON HCL 4 MG/2ML IJ SOLN
INTRAMUSCULAR | Status: AC
Start: 2021-12-20 — End: ?
  Filled 2021-12-20: qty 2

## 2021-12-20 MED ORDER — ATORVASTATIN CALCIUM 80 MG PO TABS
80.0000 mg | ORAL_TABLET | Freq: Every day | ORAL | Status: DC
Start: 2021-12-20 — End: 2022-01-04
  Administered 2021-12-20 – 2022-01-02 (×14): 80 mg via ORAL
  Filled 2021-12-20 (×14): qty 1

## 2021-12-20 MED ORDER — OXYCODONE HCL 5 MG/5ML PO SOLN: 5.0000 mg | Freq: Once | ORAL | Status: DC | PRN

## 2021-12-20 MED ORDER — MEPERIDINE HCL 25 MG/ML IJ SOLN: 6.2500 mg | INTRAMUSCULAR | Status: DC | PRN

## 2021-12-20 MED ORDER — POVIDONE-IODINE 10 % EX SWAB
2.0000 | Freq: Once | CUTANEOUS | Status: AC
  Administered 2021-12-20: 2 via TOPICAL

## 2021-12-20 MED ORDER — FENTANYL CITRATE (PF) 250 MCG/5ML IJ SOLN
INTRAMUSCULAR | Status: DC | PRN
  Administered 2021-12-20 (×3): 50 ug via INTRAVENOUS

## 2021-12-20 MED ORDER — INSULIN ASPART 100 UNIT/ML IJ SOLN
0.0000 [IU] | Freq: Three times a day (TID) | INTRAMUSCULAR | Status: DC
  Administered 2021-12-21 (×2): 3 [IU] via SUBCUTANEOUS
  Administered 2021-12-21 – 2021-12-26 (×8): 2 [IU] via SUBCUTANEOUS
  Administered 2021-12-26 – 2021-12-27 (×2): 3 [IU] via SUBCUTANEOUS
  Administered 2021-12-28: 2 [IU] via SUBCUTANEOUS
  Administered 2021-12-28 – 2022-01-01 (×3): 3 [IU] via SUBCUTANEOUS
  Administered 2022-01-01 – 2022-01-03 (×3): 2 [IU] via SUBCUTANEOUS
  Administered 2022-01-03: 3 [IU] via SUBCUTANEOUS

## 2021-12-20 MED ORDER — METHOCARBAMOL 500 MG PO TABS
500.0000 mg | ORAL_TABLET | Freq: Four times a day (QID) | ORAL | Status: DC | PRN
  Administered 2021-12-20 – 2021-12-27 (×8): 500 mg via ORAL
  Filled 2021-12-20 (×9): qty 1

## 2021-12-20 MED ORDER — ONDANSETRON HCL 4 MG/2ML IJ SOLN: 4.0000 mg | Freq: Four times a day (QID) | INTRAMUSCULAR | Status: DC | PRN

## 2021-12-20 MED ORDER — CARVEDILOL 3.125 MG PO TABS
3.1250 mg | ORAL_TABLET | Freq: Two times a day (BID) | ORAL | Status: DC
  Administered 2021-12-20 – 2022-01-03 (×28): 3.125 mg via ORAL
  Filled 2021-12-20 (×30): qty 1

## 2021-12-20 MED ORDER — ROCURONIUM BROMIDE 10 MG/ML (PF) SYRINGE
PREFILLED_SYRINGE | INTRAVENOUS | Status: AC
  Filled 2021-12-20: qty 10

## 2021-12-20 MED ORDER — CHLORHEXIDINE GLUCONATE 0.12 % MT SOLN: 15.0000 mL | Freq: Once | OROMUCOSAL | Status: AC

## 2021-12-20 MED ORDER — CEFAZOLIN SODIUM-DEXTROSE 2-4 GM/100ML-% IV SOLN
2.0000 g | INTRAVENOUS | Status: AC
Start: 2021-12-20 — End: 2021-12-20
  Administered 2021-12-20: 2 g via INTRAVENOUS

## 2021-12-20 MED ORDER — MENTHOL 3 MG MT LOZG: 1.0000 | LOZENGE | OROMUCOSAL | Status: DC | PRN

## 2021-12-20 MED ORDER — BUPIVACAINE LIPOSOME 1.3 % IJ SUSP
20.0000 mL | Freq: Once | INTRAMUSCULAR | Status: DC
  Filled 2021-12-20: qty 20

## 2021-12-20 MED ORDER — INSULIN ASPART 100 UNIT/ML IJ SOLN
6.0000 [IU] | Freq: Three times a day (TID) | INTRAMUSCULAR | Status: DC
  Administered 2021-12-21 – 2022-01-03 (×33): 6 [IU] via SUBCUTANEOUS

## 2021-12-20 MED ORDER — PROPOFOL 10 MG/ML IV BOLUS
INTRAVENOUS | Status: AC
  Filled 2021-12-20: qty 40

## 2021-12-20 MED ORDER — ACETAMINOPHEN 500 MG PO TABS
ORAL_TABLET | ORAL | Status: AC
  Administered 2021-12-20: 1000 mg via ORAL
  Filled 2021-12-20: qty 2

## 2021-12-20 MED ORDER — DEXAMETHASONE SODIUM PHOSPHATE 10 MG/ML IJ SOLN: 8.0000 mg | Freq: Once | INTRAMUSCULAR | Status: DC

## 2021-12-20 MED ORDER — ACETAMINOPHEN 325 MG PO TABS
325.0000 mg | ORAL_TABLET | Freq: Four times a day (QID) | ORAL | Status: DC | PRN
  Administered 2021-12-22 – 2021-12-27 (×5): 650 mg via ORAL
  Administered 2021-12-28: 325 mg via ORAL
  Administered 2021-12-30: 650 mg via ORAL
  Filled 2021-12-20: qty 2
  Filled 2021-12-20: qty 1
  Filled 2021-12-20 (×6): qty 2

## 2021-12-20 MED ORDER — LIDOCAINE 2% (20 MG/ML) 5 ML SYRINGE
INTRAMUSCULAR | Status: AC
Start: 2021-12-20 — End: ?
  Filled 2021-12-20: qty 5

## 2021-12-20 MED ORDER — LEVOTHYROXINE SODIUM 75 MCG PO TABS
75.0000 ug | ORAL_TABLET | Freq: Every day | ORAL | Status: DC
Start: 2021-12-20 — End: 2022-01-04
  Administered 2021-12-20 – 2022-01-03 (×15): 75 ug via ORAL
  Filled 2021-12-20 (×15): qty 1

## 2021-12-20 MED ORDER — ACETAMINOPHEN 500 MG PO TABS
1000.0000 mg | ORAL_TABLET | Freq: Four times a day (QID) | ORAL | Status: AC
  Administered 2021-12-20 – 2021-12-21 (×4): 1000 mg via ORAL
  Filled 2021-12-20 (×4): qty 2

## 2021-12-20 MED ORDER — LOSARTAN POTASSIUM 50 MG PO TABS
25.0000 mg | ORAL_TABLET | Freq: Every day | ORAL | Status: DC
  Administered 2021-12-20 – 2022-01-03 (×15): 25 mg via ORAL
  Filled 2021-12-20 (×15): qty 1

## 2021-12-20 MED ORDER — OXYCODONE HCL 5 MG PO TABS: 5.0000 mg | ORAL_TABLET | Freq: Once | ORAL | Status: DC | PRN

## 2021-12-20 MED ORDER — TRANEXAMIC ACID-NACL 1000-0.7 MG/100ML-% IV SOLN
INTRAVENOUS | Status: AC
  Filled 2021-12-20: qty 100

## 2021-12-20 MED ORDER — OXYCODONE HCL 5 MG PO TABS
10.0000 mg | ORAL_TABLET | ORAL | Status: DC | PRN
  Administered 2021-12-21 – 2021-12-27 (×6): 10 mg via ORAL
  Administered 2021-12-29 – 2021-12-30 (×2): 15 mg via ORAL
  Administered 2021-12-31: 10 mg via ORAL
  Filled 2021-12-20 (×3): qty 2
  Filled 2021-12-20: qty 3
  Filled 2021-12-20 (×2): qty 2
  Filled 2021-12-20: qty 3

## 2021-12-20 MED ORDER — METHOCARBAMOL 1000 MG/10ML IJ SOLN: 500.0000 mg | Freq: Four times a day (QID) | INTRAVENOUS | Status: DC | PRN

## 2021-12-20 MED ORDER — FENTANYL CITRATE (PF) 250 MCG/5ML IJ SOLN
INTRAMUSCULAR | Status: AC
  Filled 2021-12-20: qty 5

## 2021-12-20 MED ORDER — PROPOFOL 10 MG/ML IV BOLUS
INTRAVENOUS | Status: DC | PRN
  Administered 2021-12-20: 90 mg via INTRAVENOUS

## 2021-12-20 MED ORDER — APIXABAN 2.5 MG PO TABS
2.5000 mg | ORAL_TABLET | Freq: Two times a day (BID) | ORAL | Status: DC
  Administered 2021-12-21 – 2021-12-22 (×4): 2.5 mg via ORAL
  Filled 2021-12-20 (×5): qty 1

## 2021-12-20 MED ORDER — DOCUSATE SODIUM 100 MG PO CAPS
100.0000 mg | ORAL_CAPSULE | Freq: Two times a day (BID) | ORAL | Status: DC
  Administered 2021-12-20 – 2022-01-03 (×28): 100 mg via ORAL
  Filled 2021-12-20 (×28): qty 1

## 2021-12-20 MED ORDER — HYDRALAZINE HCL 20 MG/ML IJ SOLN
10.0000 mg | Freq: Three times a day (TID) | INTRAMUSCULAR | Status: DC | PRN
  Administered 2021-12-20: 10 mg via INTRAVENOUS
  Filled 2021-12-20: qty 1

## 2021-12-20 MED ORDER — HYDROMORPHONE HCL 1 MG/ML IJ SOLN
0.2500 mg | INTRAMUSCULAR | Status: DC | PRN
  Administered 2021-12-20 (×4): 0.5 mg via INTRAVENOUS

## 2021-12-20 MED ORDER — PANTOPRAZOLE SODIUM 40 MG PO TBEC
40.0000 mg | DELAYED_RELEASE_TABLET | Freq: Every day | ORAL | Status: DC
  Administered 2021-12-20 – 2022-01-03 (×15): 40 mg via ORAL
  Filled 2021-12-20 (×15): qty 1

## 2021-12-20 MED ORDER — EMPAGLIFLOZIN 10 MG PO TABS
10.0000 mg | ORAL_TABLET | Freq: Every day | ORAL | Status: DC
  Administered 2021-12-20 – 2022-01-03 (×15): 10 mg via ORAL
  Filled 2021-12-20 (×15): qty 1

## 2021-12-20 MED ORDER — EPHEDRINE SULFATE-NACL 50-0.9 MG/10ML-% IV SOSY
PREFILLED_SYRINGE | INTRAVENOUS | Status: DC | PRN
  Administered 2021-12-20: 5 mg via INTRAVENOUS

## 2021-12-20 MED ORDER — PROMETHAZINE HCL 25 MG/ML IJ SOLN: 6.2500 mg | INTRAMUSCULAR | Status: DC | PRN

## 2021-12-20 MED ORDER — PHENOL 1.4 % MT LIQD
1.0000 | OROMUCOSAL | Status: DC | PRN
Start: 2021-12-20 — End: 2022-01-04

## 2021-12-20 MED ORDER — POLYETHYLENE GLYCOL 3350 17 G PO PACK
17.0000 g | PACK | Freq: Every day | ORAL | Status: DC | PRN
  Administered 2021-12-24 – 2021-12-28 (×3): 17 g via ORAL
  Filled 2021-12-20 (×3): qty 1

## 2021-12-20 MED ORDER — MIDAZOLAM HCL 2 MG/2ML IJ SOLN
INTRAMUSCULAR | Status: AC
  Filled 2021-12-20: qty 2

## 2021-12-20 MED ORDER — ACETAMINOPHEN 500 MG PO TABS: 1000.0000 mg | ORAL_TABLET | Freq: Once | ORAL | Status: AC

## 2021-12-20 MED ORDER — HEMOSTATIC AGENTS (NO CHARGE) OPTIME
TOPICAL | Status: DC | PRN
  Administered 2021-12-20: 1 via TOPICAL

## 2021-12-20 MED ORDER — OXYCODONE HCL 5 MG PO TABS
5.0000 mg | ORAL_TABLET | ORAL | Status: DC | PRN
  Administered 2021-12-20: 10 mg via ORAL
  Administered 2021-12-22: 5 mg via ORAL
  Administered 2021-12-23 – 2021-12-24 (×2): 10 mg via ORAL
  Administered 2021-12-24: 5 mg via ORAL
  Administered 2021-12-25 – 2021-12-26 (×3): 10 mg via ORAL
  Administered 2021-12-26 (×2): 5 mg via ORAL
  Administered 2021-12-28 – 2022-01-03 (×9): 10 mg via ORAL
  Filled 2021-12-20 (×7): qty 2
  Filled 2021-12-20: qty 1
  Filled 2021-12-20 (×5): qty 2
  Filled 2021-12-20: qty 1
  Filled 2021-12-20: qty 2
  Filled 2021-12-20: qty 1
  Filled 2021-12-20 (×3): qty 2
  Filled 2021-12-20: qty 1
  Filled 2021-12-20: qty 2

## 2021-12-20 MED ORDER — FENTANYL CITRATE (PF) 100 MCG/2ML IJ SOLN
INTRAMUSCULAR | Status: AC
  Administered 2021-12-20: 50 ug
  Filled 2021-12-20: qty 2

## 2021-12-20 MED ORDER — SURGIPHOR WOUND IRRIGATION SYSTEM - OPTIME: TOPICAL | Status: DC | PRN

## 2021-12-20 MED ORDER — LEVOTHYROXINE SODIUM 75 MCG PO TABS: 75.0000 ug | ORAL_TABLET | Freq: Every day | ORAL | Status: DC

## 2021-12-20 SURGICAL SUPPLY — 91 items
AUG FEM DIST KNEE 9/9+ 10 (Joint) ×4 IMPLANT
AUG HALF BLOCK TIB EF RL 5 (Joint) ×2 IMPLANT
AUG HALF BLOCK TIB EF RM 5 (Joint) ×2 IMPLANT
AUGMENT FEM DIST KNEE 9/9+ 10 (Joint) IMPLANT
AUGMENT HALF BLOCK TIB EF RL 5 (Joint) IMPLANT
AUGMENT HALF BLOCK TIB EF RM 5 (Joint) IMPLANT
BAG COUNTER SPONGE SURGICOUNT (BAG) ×1 IMPLANT
BANDAGE ESMARK 6X9 LF (GAUZE/BANDAGES/DRESSINGS) IMPLANT
BLADE HEX COATED 2.75 (ELECTRODE) ×2 IMPLANT
BLADE LONG MED 31X9 (MISCELLANEOUS) ×1 IMPLANT
BLADE SAG 18X100X1.27 (BLADE) ×2 IMPLANT
BLADE SAW SAG 35X64 .89 (BLADE) ×2 IMPLANT
BLADE SAW SGTL 11.0X1.19X90.0M (BLADE) ×1 IMPLANT
BNDG COHESIVE 6X5 TAN NS LF (GAUZE/BANDAGES/DRESSINGS) ×1 IMPLANT
BNDG ELASTIC 6X10 VLCR STRL LF (GAUZE/BANDAGES/DRESSINGS) ×2 IMPLANT
BNDG ESMARK 6X9 LF (GAUZE/BANDAGES/DRESSINGS) ×2
BOWL SMART MIX CTS (DISPOSABLE) ×2 IMPLANT
CANISTER WOUND CARE 500ML ATS (WOUND CARE) ×1 IMPLANT
CEMENT BONE REFOBACIN R1X40 US (Cement) ×2 IMPLANT
CHLORAPREP W/TINT 26 (MISCELLANEOUS) ×4 IMPLANT
COMP TIB FIXED CMT E RT (Joint) ×2 IMPLANT
COMPONENT TIB FIXED CMT E RT (Joint) IMPLANT
COVER SURGICAL LIGHT HANDLE (MISCELLANEOUS) ×2 IMPLANT
CUFF TOURN SGL QUICK 34 (TOURNIQUET CUFF) ×1
CUFF TRNQT CYL 34X4.125X (TOURNIQUET CUFF) ×1 IMPLANT
DERMABOND ADVANCED (GAUZE/BANDAGES/DRESSINGS)
DERMABOND ADVANCED .7 DNX12 (GAUZE/BANDAGES/DRESSINGS) ×1 IMPLANT
DRAPE INCISE IOBAN 66X45 STRL (DRAPES) IMPLANT
DRAPE INCISE IOBAN 85X60 (DRAPES) ×2 IMPLANT
DRAPE SHEET LG 3/4 BI-LAMINATE (DRAPES) ×2 IMPLANT
DRAPE U-SHAPE 47X51 STRL (DRAPES) ×2 IMPLANT
DRESSING AQUACEL AG SP 3.5X10 (GAUZE/BANDAGES/DRESSINGS) ×1 IMPLANT
DRESSING PEEL AND PLAC PRVNA20 (GAUZE/BANDAGES/DRESSINGS) IMPLANT
DRSG AQUACEL AG SP 3.5X10 (GAUZE/BANDAGES/DRESSINGS)
DRSG PEEL AND PLACE PREVENA 20 (GAUZE/BANDAGES/DRESSINGS) ×2
GLOVE BIO SURGEON STRL SZ7.5 (GLOVE) ×4 IMPLANT
GLOVE BIOGEL PI IND STRL 7.5 (GLOVE) ×1 IMPLANT
GLOVE BIOGEL PI IND STRL 8 (GLOVE) ×1 IMPLANT
GLOVE BIOGEL PI INDICATOR 7.5 (GLOVE) ×1
GLOVE BIOGEL PI INDICATOR 8 (GLOVE) ×1
GLOVE SURG ENC MOIS LTX SZ8 (GLOVE) ×4 IMPLANT
GOWN STRL REUS W/TWL XL LVL3 (GOWN DISPOSABLE) ×2 IMPLANT
HANDPIECE INTERPULSE COAX TIP (DISPOSABLE) ×1
HDLS TROCR DRIL PIN KNEE 75 (PIN) ×1
HEMOSTAT ARISTA ABSORB 3G PWDR (HEMOSTASIS) ×1 IMPLANT
HOLDER FOLEY CATH W/STRAP (MISCELLANEOUS) IMPLANT
HOOD PEEL AWAY FLYTE STAYCOOL (MISCELLANEOUS) ×6 IMPLANT
INSERT TIB KNEE EF/7-9 14 RT (Insert) ×1 IMPLANT
INSERT TIB PS CMTLS CC FX (Insert) ×1 IMPLANT
INSTR SCRW HEX REV FIX 3.5X48 (ORTHOPEDIC DISPOSABLE SUPPLIES) ×2
INSTRUMENT SCRW HEX REV 3.5X48 (ORTHOPEDIC DISPOSABLE SUPPLIES) IMPLANT
KIT DRSG PREVENA PLUS 7DAY 125 (MISCELLANEOUS) ×1 IMPLANT
MANIFOLD NEPTUNE II (INSTRUMENTS) ×3 IMPLANT
MARKER SKIN DUAL TIP RULER LAB (MISCELLANEOUS) ×4 IMPLANT
NDL 18GX1X1/2 (RX/OR ONLY) (NEEDLE) IMPLANT
NEEDLE 18GX1X1/2 (RX/OR ONLY) (NEEDLE) ×2 IMPLANT
NS IRRIG 1000ML POUR BTL (IV SOLUTION) ×2 IMPLANT
PACK ICE MAXI GEL EZY WRAP (MISCELLANEOUS) IMPLANT
PACK TOTAL KNEE CUSTOM (KITS) ×2 IMPLANT
PIN DRILL HDLS TROCAR 75 4PK (PIN) IMPLANT
PROSTHESIS FEM KNEE UNCOATED (Knees) ×1 IMPLANT
PROTECTOR NERVE ULNAR (MISCELLANEOUS) ×1 IMPLANT
SCREW HEX HEADED 3.5X27 DISP (ORTHOPEDIC DISPOSABLE SUPPLIES) ×1 IMPLANT
SET HNDPC FAN SPRY TIP SCT (DISPOSABLE) ×1 IMPLANT
SOLUTION IRRIG SURGIPHOR (IV SOLUTION) ×2 IMPLANT
SPIKE FLUID TRANSFER (MISCELLANEOUS) ×1 IMPLANT
SPONGE T-LAP 18X18 ~~LOC~~+RFID (SPONGE) ×4 IMPLANT
STEM FEM OFFSET 135X16X3 (Stem) ×1 IMPLANT
STEM FEM OFFSET 6 EXT 16X135 (Miscellaneous) ×1 IMPLANT
STEM POLY PAT PLY 38M KNEE (Knees) ×1 IMPLANT
SUCTION FRAZIER HANDLE 10FR (MISCELLANEOUS) ×1
SUCTION TUBE FRAZIER 10FR DISP (MISCELLANEOUS) IMPLANT
SUT ETHILON 3 0 PS 1 (SUTURE) ×8 IMPLANT
SUT MNCRL AB 3-0 PS2 18 (SUTURE) ×1 IMPLANT
SUT STRATAFIX 0 PDS 27 VIOLET (SUTURE)
SUT STRATAFIX 1PDS 45CM VIOLET (SUTURE) ×3 IMPLANT
SUT STRATAFIX PDO 1 14 VIOLET (SUTURE)
SUT STRATFX PDO 1 14 VIOLET (SUTURE)
SUT VIC AB 0 CT1 27 (SUTURE) ×2
SUT VIC AB 0 CT1 27XBRD ANBCTR (SUTURE) IMPLANT
SUT VIC AB 1 CT1 36 (SUTURE) ×2 IMPLANT
SUT VIC AB 2-0 CT2 27 (SUTURE) ×4 IMPLANT
SUTURE STRATFX 0 PDS 27 VIOLET (SUTURE) ×1 IMPLANT
SUTURE STRATFX PDO 1 14 VIOLET (SUTURE) ×1 IMPLANT
SYR 50ML LL SCALE MARK (SYRINGE) ×2 IMPLANT
TRAY FOLEY MTR SLVR 14FR STAT (SET/KITS/TRAYS/PACK) IMPLANT
TRAY FOLEY MTR SLVR 16FR STAT (SET/KITS/TRAYS/PACK) IMPLANT
TUBE CONNECTING 12X1/4 (SUCTIONS) ×1 IMPLANT
TUBE SUCTION HIGH CAP CLEAR NV (SUCTIONS) ×2 IMPLANT
WEDGE FEM F/ARTHRO 9X5 (Miscellaneous) ×2 IMPLANT
WRAP KNEE MAXI GEL POST OP (GAUZE/BANDAGES/DRESSINGS) ×1 IMPLANT

## 2021-12-20 NOTE — Anesthesia Postprocedure Evaluation (Signed)
Anesthesia Post Note  Patient: Danielle [name]  Procedure(s) Performed: REMOVAL OF RIGHT KNEE ANTIBIOTIC SPACER AND REVISION RIGHT KNEE TOTAL ARTHROPLASTY (Right: Knee)     Patient location during evaluation: PACU Anesthesia Type: General Level of consciousness: awake and alert Pain management: pain level controlled Vital Signs Assessment: post-procedure vital signs reviewed and stable Respiratory status: spontaneous breathing, nonlabored ventilation and respiratory function stable Cardiovascular status: blood pressure returned to baseline and stable Postop Assessment: no apparent nausea or vomiting Anesthetic complications: no   No notable events documented.  Last Vitals:  Vitals:   12/20/21 1530 12/20/21 1628  BP: (!) 173/92 (!) 190/88  Pulse: 82   Resp: (!) 21   Temp: 36.8 C 36.7 C  SpO2: 95%     Last Pain:  Vitals:   12/20/21 1628  TempSrc: Oral  PainSc:                  [name]

## 2021-12-20 NOTE — Progress Notes (Signed)
   12/20/21 1628  Vitals  Temp 98.1 F (36.7 C)  Temp Source Oral  BP (!) 190/88  MEWS COLOR  MEWS Score Color Green  MEWS Score  MEWS Temp 0  MEWS Systolic 0  MEWS Pulse 0  MEWS RR 0  MEWS LOC 1  MEWS Score 1   Paged Marchiwiany, MD regarding BP. Patient is too lethargic to take anything PO, requested IV PRN. Waiting for orders

## 2021-12-20 NOTE — Discharge Instructions (Addendum)
INSTRUCTIONS AFTER JOINT REPLACEMENT   Remove items at home which could result in a fall. This includes throw rugs or furniture in walking pathways ICE to the affected joint every three hours while awake for 30 minutes at a time, for at least the first 3-5 days, and then as needed for pain and swelling.  Continue to use ice for pain and swelling. You may notice swelling that will progress down to the foot and ankle.  This is normal after surgery.  Elevate your leg when you are not up walking on it.   Continue to use the breathing machine you got in the hospital (incentive spirometer) which will help keep your temperature down.  It is common for your temperature to cycle up and down following surgery, especially at night when you are not up moving around and exerting yourself.  The breathing machine keeps your lungs expanded and your temperature down.   DIET:  As you were doing prior to hospitalization, we recommend a well-balanced diet.  DRESSING / WOUND CARE / SHOWERING  Keep the surgical dressing until follow up.  The dressing is water proof, so you can shower without any extra covering.  IF THE DRESSING FALLS OFF or the wound gets wet inside, change the dressing with sterile gauze.  Please use good hand washing techniques before changing the dressing.  Do not use any lotions or creams on the incision until instructed by your surgeon.    ACTIVITY  Increase activity slowly as tolerated, but follow the weight bearing instructions below.   No driving for 6 weeks or until further direction given by your physician.  You cannot drive while taking narcotics.  No lifting or carrying greater than 10 lbs. until further directed by your surgeon. Avoid periods of inactivity such as sitting longer than an hour when not asleep. This helps prevent blood clots.  You may return to work once you are authorized by your doctor.     WEIGHT BEARING  50% partial weight bearing with assist device (walker) for 6  weeks, use it as long as suggested by your surgeon or therapist.   EXERCISES  Results after joint replacement surgery are often greatly improved when you follow the exercise, range of motion and muscle strengthening exercises prescribed by your doctor. Safety measures are also important to protect the joint from further injury. Any time any of these exercises cause you to have increased pain or swelling, decrease what you are doing until you are comfortable again and then slowly increase them. If you have problems or questions, call your caregiver or physical therapist for advice.   Rehabilitation is important following a joint replacement. After just a few days of immobilization, the muscles of the leg can become weakened and shrink (atrophy).  These exercises are designed to build up the tone and strength of the thigh and leg muscles and to improve motion. Often times heat used for twenty to thirty minutes before working out will loosen up your tissues and help with improving the range of motion but do not use heat for the first two weeks following surgery (sometimes heat can increase post-operative swelling).   These exercises can be done on a training (exercise) mat, on the floor, on a table or on a bed. Use whatever works the best and is most comfortable for you.    Use music or television while you are exercising so that the exercises are a pleasant break in your day. This will make your life better  with the exercises acting as a break in your routine that you can look forward to.   Perform all exercises about fifteen times, three times per day or as directed.  You should exercise both the operative leg and the other leg as well.  Exercises include:   Quad Sets - Tighten up the muscle on the front of the thigh (Quad) and hold for 5-10 seconds.   Straight Leg Raises - With your knee straight (if you were given a brace, keep it on), lift the leg to 60 degrees, hold for 3 seconds, and slowly lower  the leg.  Perform this exercise against resistance later as your leg gets stronger.  Leg Slides: Lying on your back, slowly slide your foot toward your buttocks, bending your knee up off the floor (only go as far as is comfortable). Then slowly slide your foot back down until your leg is flat on the floor again.  Angel Wings: Lying on your back spread your legs to the side as far apart as you can without causing discomfort.  Hamstring Strength:  Lying on your back, push your heel against the floor with your leg straight by tightening up the muscles of your buttocks.  Repeat, but this time bend your knee to a comfortable angle, and push your heel against the floor.  You may put a pillow under the heel to make it more comfortable if necessary.   A rehabilitation program following joint replacement surgery can speed recovery and prevent re-injury in the future due to weakened muscles. Contact your doctor or a physical therapist for more information on knee rehabilitation.    CONSTIPATION  Constipation is defined medically as fewer than three stools per week and severe constipation as less than one stool per week.  Even if you have a regular bowel pattern at home, your normal regimen is likely to be disrupted due to multiple reasons following surgery.  Combination of anesthesia, postoperative narcotics, change in appetite and fluid intake all can affect your bowels.   YOU MUST use at least one of the following options; they are listed in order of increasing strength to get the job done.  They are all available over the counter, and you may need to use some, POSSIBLY even all of these options:    Drink plenty of fluids (prune juice may be helpful) and high fiber foods Colace 100 mg by mouth twice a day  Senokot for constipation as directed and as needed Dulcolax (bisacodyl), take with full glass of water  Miralax (polyethylene glycol) once or twice a day as needed.  If you have tried all these things  and are unable to have a bowel movement in the first 3-4 days after surgery call either your surgeon or your primary doctor.    If you experience loose stools or diarrhea, hold the medications until you stool forms back up.  If your symptoms do not get better within 1 week or if they get worse, check with your doctor.  If you experience "the worst abdominal pain ever" or develop nausea or vomiting, please contact the office immediately for further recommendations for treatment.   ITCHING:  If you experience itching with your medications, try taking only a single pain pill, or even half a pain pill at a time.  You can also use Benadryl over the counter for itching or also to help with sleep.   TED HOSE STOCKINGS:  Use stockings on both legs until for at least 2 weeks  or as directed by physician office. They may be removed at night for sleeping.  MEDICATIONS:  See your medication summary on the "After Visit Summary" that nursing will review with you.  You may have some home medications which will be placed on hold until you complete the course of blood thinner medication.  It is important for you to complete the blood thinner medication as prescribed.   Blood clot prevention (DVT Prophylaxis): After surgery you are at an increased risk for a blood clot. you are on eliquis which will help to help reduce your risk of getting a blood clot. This will help prevent a blood clot. Signs of a pulmonary embolus (blood clot in the lungs) include sudden short of breath, feeling lightheaded or dizzy, chest pain with a deep breath, rapid pulse rapid breathing. Signs of a blood clot in your arms or legs include new unexplained swelling and cramping, warm, red or darkened skin around the painful area. Please call the office or 911 right away if these signs or symptoms develop.  PRECAUTIONS:  If you experience chest pain or shortness of breath - call 911 immediately for transfer to the hospital emergency department.    If you develop a fever greater that 101 F, purulent drainage from wound, increased redness or drainage from wound, foul odor from the wound/dressing, or calf pain - CONTACT YOUR SURGEON.                                                   FOLLOW-UP APPOINTMENTS:  If you do not already have a post-op appointment, please call the office for an appointment to be seen by your surgeon.  Guidelines for how soon to be seen are listed in your "After Visit Summary". We will see you about 1 week after discharge for a wound check.  OTHER INSTRUCTIONS:   Knee Replacement:  Do not place pillow under knee, focus on keeping the knee straight while resting. DO NOT modify, tear, cut, or change the foam block in any way.  POST-OPERATIVE OPIOID TAPER INSTRUCTIONS: It is important to wean off of your opioid medication as soon as possible. If you do not need pain medication after your surgery it is ok to stop day one. Opioids include: Codeine, Hydrocodone(Norco, Vicodin), Oxycodone(Percocet, oxycontin) and hydromorphone amongst others.  Long term and even short term use of opiods can cause: Increased pain response Dependence Constipation Depression Respiratory depression And more.  Withdrawal symptoms can include Flu like symptoms Nausea, vomiting And more Techniques to manage these symptoms Hydrate well Eat regular healthy meals Stay active Use relaxation techniques(deep breathing, meditating, yoga) Do Not substitute Alcohol to help with tapering If you have been on opioids for less than two weeks and do not have pain than it is ok to stop all together.  Plan to wean off of opioids This plan should start within one week post op of your joint replacement. Maintain the same interval or time between taking each dose and first decrease the dose.  Cut the total daily intake of opioids by one tablet each day Next start to increase the time between doses. The last dose that should be eliminated is the  evening dose.   MAKE SURE YOU:  Understand these instructions.  Get help right away if you are not doing well or get worse.  Thank you for letting us be a part of your medical care team.  It is a privilege we respect greatly.  We hope these instructions will help you stay on track for a fast and full recovery!        Carbohydrate Counting For People With Diabetes  Foods with carbohydrates make your blood glucose level go up. Learning how to count carbohydrates can help you control your blood glucose levels. First, identify the foods you eat that contain carbohydrates. Then, using the Foods with Carbohydrates chart, determine about how much carbohydrates are in your meals and snacks. Make sure you are eating foods with fiber, protein, and healthy fat along with your carbohydrate foods. Foods with Carbohydrates The following table shows carbohydrate foods that have about 15 grams of carbohydrate each. Using measuring cups, spoons, or a food scale when you first begin learning about carbohydrate counting can help you learn about the portion sizes you typically eat. The following foods have 15 grams carbohydrate each:  Grains 1 slice bread (1 ounce)  1 small tortilla (6-inch size)   large bagel (1 ounce)  1/3 cup pasta or rice (cooked)   hamburger or hot dog bun ( ounce)   cup cooked cereal   to  cup ready-to-eat cereal  2 taco shells (5-inch size) Fruit 1 small fresh fruit ( to 1 cup)   medium banana  17 small grapes (3 ounces)  1 cup melon or berries   cup canned or frozen fruit  2 tablespoons dried fruit (blueberries, cherries, cranberries, raisins)   cup unsweetened fruit juice  Starchy Vegetables  cup cooked beans, peas, corn, potatoes/sweet potatoes   large baked potato (3 ounces)  1 cup acorn or butternut squash  Snack Foods 3 to 6 crackers  8 potato chips or 13 tortilla chips ( ounce to 1 ounce)  3 cups popped popcorn  Dairy 3/4 cup (6 ounces) nonfat plain  yogurt, or yogurt with sugar-free sweetener  1 cup milk  1 cup plain rice, soy, coconut or flavored almond milk Sweets and Desserts  cup ice cream or frozen yogurt  1 tablespoon jam, jelly, pancake syrup, table sugar, or honey  2 tablespoons light pancake syrup  1 inch square of frosted cake or 2 inch square of unfrosted cake  2 small cookies (2/3 ounce each) or  large cookie  Sometimes you'll have to estimate carbohydrate amounts if you don't know the exact recipe. One cup of mixed foods like soups can have 1 to 2 carbohydrate servings, while some casseroles might have 2 or more servings of carbohydrate. Foods that have less than 20 calories in each serving can be counted as "free" foods. Count 1 cup raw vegetables, or  cup cooked non-starchy vegetables as "free" foods. If you eat 3 or more servings at one meal, then count them as 1 carbohydrate serving.  Foods without Carbohydrates  Not all foods contain carbohydrates. Meat, some dairy, fats, non-starchy vegetables, and many beverages don't contain carbohydrate. So when you count carbohydrates, you can generally exclude chicken, pork, beef, fish, seafood, eggs, tofu, cheese, butter, sour cream, avocado, nuts, seeds, olives, mayonnaise, water, black coffee, unsweetened tea, and zero-calorie drinks. Vegetables with no or low carbohydrate include green beans, cauliflower, tomatoes, and onions. How much carbohydrate should I eat at each meal?  Carbohydrate counting can help you plan your meals and manage your weight. Following are some starting points for carbohydrate intake at each meal. Work with your registered dietitian nutritionist to find the  best range that works for your blood glucose and weight.   To Lose Weight To Maintain Weight  Women 2 - 3 carb servings 3 - 4 carb servings  Men 3 - 4 carb servings 4 - 5 carb servings  Checking your blood glucose after meals will help you know if you need to adjust the timing, type, or number of  carbohydrate servings in your meal plan. Achieve and keep a healthy body weight by balancing your food intake and physical activity.  Tips How should I plan my meals?  Plan for half the food on your plate to include non-starchy vegetables, like salad greens, broccoli, or carrots. Try to eat 3 to 5 servings of non-starchy vegetables every day. Have a protein food at each meal. Protein foods include chicken, fish, meat, eggs, or beans (note that beans contain carbohydrate). These two food groups (non-starchy vegetables and proteins) are low in carbohydrate. If you fill up your plate with these foods, you will eat less carbohydrate but still fill up your stomach. Try to limit your carbohydrate portion to  of the plate.  What fats are healthiest to eat?  Diabetes increases risk for heart disease. To help protect your heart, eat more healthy fats, such as olive oil, nuts, and avocado. Eat less saturated fats like butter, cream, and high-fat meats, like bacon and sausage. Avoid trans fats, which are in all foods that list "partially hydrogenated oil" as an ingredient. What should I drink?  Choose drinks that are not sweetened with sugar. The healthiest choices are water, carbonated or seltzer waters, and tea and coffee without added sugars.  Sweet drinks will make your blood glucose go up very quickly. One serving of soda or energy drink is  cup. It is best to drink these beverages only if your blood glucose is low.  Artificially sweetened, or diet drinks, typically do not increase your blood glucose if they have zero calories in them. Read labels of beverages, as some diet drinks do have carbohydrate and will raise your blood glucose. Label Reading Tips Read Nutrition Facts labels to find out how many grams of carbohydrate are in a food you want to eat. Don't forget: sometimes serving sizes on the label aren't the same as how much food you are going to eat, so you may need to calculate how much carbohydrate  is in the food you are serving yourself.   Carbohydrate Counting for People with Diabetes Sample 1-Day Menu  Breakfast  cup yogurt, low fat, low sugar (1 carbohydrate serving)   cup cereal, ready-to-eat, unsweetened (1 carbohydrate serving)  1 cup strawberries (1 carbohydrate serving)   cup almonds ( carbohydrate serving)  Lunch 1, 5 ounce can chunk light tuna  2 ounces cheese, low fat cheddar  6 whole wheat crackers (1 carbohydrate serving)  1 small apple (1 carbohydrate servings)   cup carrots ( carbohydrate serving)   cup snap peas  1 cup 1% milk (1 carbohydrate serving)   Evening Meal Stir fry made with: 3 ounces chicken  1 cup brown rice (3 carbohydrate servings)   cup broccoli ( carbohydrate serving)   cup green beans   cup onions  1 tablespoon olive oil  2 tablespoons teriyaki sauce ( carbohydrate serving)  Evening Snack 1 extra small banana (1 carbohydrate serving)  1 tablespoon peanut butter   Carbohydrate Counting for People with Diabetes Vegan Sample 1-Day Menu  Breakfast 1 cup cooked oatmeal (2 carbohydrate servings)   cup blueberries (1 carbohydrate serving)  2 tablespoons flaxseeds  1 cup soymilk fortified with calcium and vitamin D  1 cup coffee  Lunch 2 slices whole wheat bread (2 carbohydrate servings)   cup baked tofu   cup lettuce  2 slices tomato  2 slices avocado   cup baby carrots ( carbohydrate serving)  1 orange (1 carbohydrate serving)  1 cup soymilk fortified with calcium and vitamin D   Evening Meal Burrito made with: 1 6-inch corn tortilla (1 carbohydrate serving)  1 cup refried vegetarian beans (2 carbohydrate servings)   cup chopped tomatoes   cup lettuce   cup salsa  1/3 cup brown rice (1 carbohydrate serving)  1 tablespoon olive oil for rice   cup zucchini   Evening Snack 6 small whole grain crackers (1 carbohydrate serving)  2 apricots ( carbohydrate serving)   cup unsalted peanuts ( carbohydrate serving)     Carbohydrate Counting for People with Diabetes Vegetarian (Lacto-Ovo) Sample 1-Day Menu  Breakfast 1 cup cooked oatmeal (2 carbohydrate servings)   cup blueberries (1 carbohydrate serving)  2 tablespoons flaxseeds  1 egg  1 cup 1% milk (1 carbohydrate serving)  1 cup coffee  Lunch 2 slices whole wheat bread (2 carbohydrate servings)  2 ounces low-fat cheese   cup lettuce  2 slices tomato  2 slices avocado   cup baby carrots ( carbohydrate serving)  1 orange (1 carbohydrate serving)  1 cup unsweetened tea  Evening Meal Burrito made with: 1 6-inch corn tortilla (1 carbohydrate serving)   cup refried vegetarian beans (1 carbohydrate serving)   cup tomatoes   cup lettuce   cup salsa  1/3 cup brown rice (1 carbohydrate serving)  1 tablespoon olive oil for rice   cup zucchini  1 cup 1% milk (1 carbohydrate serving)  Evening Snack 6 small whole grain crackers (1 carbohydrate serving)  2 apricots ( carbohydrate serving)   cup unsalted peanuts ( carbohydrate serving)    Copyright 2020  [employer]. All rights reserved.  Using Nutrition Labels: Carbohydrate  Serving Size  Look at the serving size. All the information on the label is based on this portion. Servings Per Container  The number of servings contained in the package. Guidelines for Carbohydrate  Look at the total grams of carbohydrate in the serving size.  1 carbohydrate choice = 15 grams of carbohydrate. Range of Carbohydrate Grams Per Choice  Carbohydrate Grams/Choice Carbohydrate Choices  6-10   11-20 1  21-25 1  26-35 2  36-40 2  41-50 3  51-55 3  56-65 4  66-70 4  71-80 5    Copyright 2020  [employer]y [employer]. All rights reserved.

## 2021-12-20 NOTE — Anesthesia Procedure Notes (Signed)
Procedure Name: Intubation Date/Time: 12/20/2021 11:09 AM  Performed by: Michele Rockers, CRNAPre-anesthesia Checklist: Patient identified, Patient being monitored, Timeout performed, Emergency Drugs available and Suction available Patient Re-evaluated:Patient Re-evaluated prior to induction Oxygen Delivery Method: Circle system utilized Preoxygenation: Pre-oxygenation with 100% oxygen Induction Type: IV induction Ventilation: Mask ventilation without difficulty Laryngoscope Size: Miller and 2 Grade View: Grade I Tube type: Oral Tube size: 7.0 mm Number of attempts: 1 Airway Equipment and Method: Stylet Placement Confirmation: ETT inserted through vocal cords under direct vision, positive ETCO2 and breath sounds checked- equal and bilateral Secured at: 21 cm Tube secured with: Tape Dental Injury: Teeth and Oropharynx as per pre-operative assessment

## 2021-12-20 NOTE — Interval H&P Note (Signed)
The patient has been re-examined, and the chart reviewed, and there have been no interval changes to the documented history and physical.    Patient has complicated surgical history.  Initially underwent uncomplicated right total arthroplasty with Dr. Mardelle Matte in 2019.  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  I&D and liner exchange was performed but unfortunately when stopping antibiotics the infection recurred.  In February we proceeded with an explant of her total knee arthroplasty placement of a spacer.  Postoperative course was complicated by anemia requiring ICU admission.  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  We have since cleared her prosthetic joint infection confirmed with a negative Synovasure aspiration 1 month ago.  She has been optimized and cleared medically and by hematology.  Hemoglobin now 10.9 is probably as good as it will get.  She understands that she could try to live with antibiotic spacer but have limited function and continued pain without middle subsidence.  She at this point would like to accept the risks of revision surgery and have the revision knee arthroplasty performed.    The operative side was examined and the patient was confirmed to have sensation to DPN, SPN, TN intact, Motor EHL, ext, flex 5/5, and DP 2+, PT 2+, No significant edema.  Well-healed surgical scar without erythema or drainage.   The risks, benefits, and alternatives have been discussed at length with patient and her daughter at bedside, and the patient is willing to proceed.  Right knee marked. Consent has been signed.

## 2021-12-20 NOTE — Progress Notes (Signed)
Orthopedic Tech Progress Note Patient Details:  Danielle [name] 05/14/1946 [id number]  Ortho Devices Type of Ortho Device: Bone foam zero knee Ortho Device/Splint Interventions: Ordered, Application, Adjustment   Post Interventions Patient Tolerated: Well Instructions Provided: Care of device  Avinash Maltos E [name] 12/20/2021, 4:40 PM

## 2021-12-20 NOTE — Anesthesia Procedure Notes (Signed)
Anesthesia Regional Block: Adductor canal block   Pre-Anesthetic Checklist: , timeout performed,  Correct Patient, Correct Site, Correct Laterality,  Correct Procedure, Correct Position, site marked,  Risks and benefits discussed,  Surgical consent,  Pre-op evaluation,  At surgeon's request and post-op pain management  Laterality: Right and Lower  Prep: chloraprep       Needles:  Injection technique: Single-shot  Needle Type: Echogenic Needle     Needle Length: 9cm  Needle Gauge: 21     Additional Needles:   Procedures:,,,, ultrasound used (permanent image in chart),,    Narrative:  Start time: 12/20/2021 10:24 AM End time: 12/20/2021 10:30 AM Injection made incrementally with aspirations every 5 mL.  Performed by: Personally  Anesthesiologist: Annye Asa, MD  Additional Notes: Pt identified in Holding room.  Monitors applied. Working IV access confirmed. Sterile prep R thigh.  #21ga ECHOgenic Arrow block needle into adductor canal with US guidance.  20cc 0.75% Ropivacaine injected incrementally after negative test dose.  Patient asymptomatic, VSS, no heme aspirated, tolerated well.   Jenita Seashore, MD

## 2021-12-20 NOTE — Op Note (Signed)
DATE OF SURGERY:  12/20/2021 TIME: 3:11 PM  PATIENT NAME:  Danielle [name]   AGE: 76 y.o.    PRE-OPERATIVE DIAGNOSIS: Revision right total knee arthroplasty, removal antibiotic spacer and replant for previous prosthetic joint infection  POST-OPERATIVE DIAGNOSIS:  Same  PROCEDURE: Revision right total Knee Arthroplasty  SURGEON:  Tirsa Gail A Stephaine Breshears, MD   ASSISTANT: Izola Price, RNFA, present and scrubbed throughout the case, critical for assistance with exposure, retraction, instrumentation, and closure.   OPERATIVE IMPLANTS:  Cemented revision Zimmer persona, 135 mm x 60 mm diameter femoral press-fit stem with 6 mm offset, 10 mm augment distally medially and laterally 5 mm augment posteriorly medially and laterally, size 9 standard revision femoral component, 135 x 16 mm diameter with 3 mm offset tibial stem extension.,  5 mm distal augment, fixed tibia trabecular metal cone, size C cemented tibial baseplate, 14 mm CCK polyethylene liner, 38 mm patella Implant Name Type Inv. Item Serial No. Manufacturer Lot No. LRB No. Used Action  CEMENT BONE REFOBACIN [id number] [place] - [id number] Cement CEMENT BONE REFOBACIN R1X40 [place]  ZIMMER RECON(ORTH,TRAU,BIO,SG) [id number] Right 2 Implanted  COMP TIB NEXGEN CH/4 66 14 LT - [id number] Joint COMP TIB NEXGEN CH/4 66 14 LT  ZIMMER RECON(ORTH,TRAU,BIO,SG) [id number] Right 1 Explanted  COMP FEM CMT PERSONA SZ9 RT - [id number] Joint COMP FEM CMT PERSONA SZ9 RT  ZIMMER RECON(ORTH,TRAU,BIO,SG) [id number] Right 1 Explanted  AUG HALF BLOCK TIB EF RM 5 - [id number] Joint AUG HALF BLOCK TIB EF RM 5  ZIMMER RECON(ORTH,TRAU,BIO,SG) [id number] Right 1 Implanted  AUG HALF BLOCK TIB EF RL 5 - [id number] Joint AUG HALF BLOCK TIB EF RL 5  ZIMMER RECON(ORTH,TRAU,BIO,SG) [id number] Right 1 Implanted  INSERT TIB PS CMTLS CC FX - [id number] Insert INSERT TIB PS CMTLS CC FX  ZIMMER RECON(ORTH,TRAU,BIO,SG) [id number] Right 1 Implanted  COMP TIB FIXED CMT E RT - [id number] Joint COMP TIB FIXED CMT E RT   ZIMMER RECON(ORTH,TRAU,BIO,SG) [id number] Right 1 Implanted  STEM FEM OFFSET 945W38U8 - [id number] Stem STEM FEM OFFSET [id number]  ZIMMER RECON(ORTH,TRAU,BIO,SG) [id number] Right 1 Implanted  STEM POLY PAT PLY 83M KNEE - [id number] Knees STEM POLY PAT PLY 83M KNEE  ZIMMER RECON(ORTH,TRAU,BIO,SG) [id number] Right 1 Implanted  REVISION FEMORAL DISTAL AUGMENT SIZE 9, 9+   10MM THICKNESS    ZIMMER [id number] Right 1 Implanted  REVISION FEMORAL DISTAL AUGMENT SIZE 9, 9+   10 MM THICKNESS    ZIMMER [id number] Right 1 Implanted  WEDGE FEM F/ARTHRO 9X5 - [id number] Miscellaneous WEDGE FEM F/ARTHRO 9X5  ZIMMER RECON(ORTH,TRAU,BIO,SG) [id number] Right 1 Implanted  WEDGE FEM F/ARTHRO 9X5 - [id number] Miscellaneous WEDGE FEM F/ARTHRO 9X5  ZIMMER RECON(ORTH,TRAU,BIO,SG) [id number] Right 1 Implanted  PROSTHESIS FEM KNEE UNCOATED - [id number] Knees PROSTHESIS FEM KNEE UNCOATED  ZIMMER RECON(ORTH,TRAU,BIO,SG) [id number] Right 1 Implanted  STEM FEM OFFSET 6 EXT 16X135 - [id number] Miscellaneous STEM FEM OFFSET 6 EXT [id number]  ZIMMER RECON(ORTH,TRAU,BIO,SG) [id number] Right 1 Implanted  Revision Bivacit-E Polyethylene Fixed Bearing Constrained Condylar Knee Right 14 mm Height  with Locking Screw and Insert     [id number] Right 1 Implanted     PREOPERATIVE INDICATIONS: [name] is a 76 y.o. year old female with history of MSSA positive prosthetic joint infection that had failed an irrigation debridement and liner exchange back in November.  She underwent explant in February.  She is healed well since the explant.  She was treated with Ancef and cefadroxil postoperatively.  After being off the antibiotics for 2 weeks the knee was reaspirated.  Aspiration of  the knee joint 1 month ago was negative for infection.  She elected to proceed with second stage replant revision total knee arthroplasty. The risks, benefits, and alternatives were discussed at length including but not limited to the risks of infection, bleeding, nerve injury, stiffness,  blood clots, the need for revision surgery, cardiopulmonary complications, among others, and they were willing to proceed.  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  Much better hemostasis during the revision surgery today.  Minimal blood loss on approach which was controlled with the Arista agent.  There was only about 100 cc of blood collected in the Cell Saver not enough to give back to the patient  ESTIMATED BLOOD LOSS: 250cc  OPERATIVE DESCRIPTION:  Once adequate anesthesia was induced, preoperative antibiotics, 2 gm of ancef administered, the patient was positioned supine with a right thigh tourniquet placed.  The right lower extremity was prepped and draped in sterile fashion.  A time-  out was performed identifying the patient, planned procedure, and the appropriate extremity.     The leg was  exsanguinated, tourniquet elevated to 250 mmHg.  A midline incision was made utilizing his old scar.  Full-thickness skin flaps were elevated over the capsule.  There was moderate skin and soft tissue bleeding and Arista hemostatic agent was applied to the tissues.  This markedly improved the bleeding.  A medial parapatellar arthrotomy was performed.  A moderate serous effusion was encountered with no evidence of purulence or infection within the knee.  A circumferential synovectomy was performed.    We next performed a large medial release off the tibial plateau with Bovie cautery, to the posterior medial aspect of the tibia..   Suprapatellar fat pad was resected.  Soft tissue at the bone implant interface was resected.    We next turned our attention to explanting the femoral and tibial components.  The peripheral edge was exposed circumferentially around the femoral component using Bovie cautery  and a rongeur.  Next using quarter inch straight osteotome and a small ACL blade saw.  We were able to break up the implant cement interface circumferentially.  Next using a bone tamp and mallet we were able to easily remove the femoral component.  The antibiotic cement dowel in the canal was able to be removed with a rogeur.   Next we turned our attention to explanting the tibial component.  The tibia was also exposed circumferentially and using a ACL saw blade and quarter inch osteotome we were able to break up the implant cement interface.  The tibial component was easily mobilized and explanted.  The tibial component was able to be extracted also with minimal bone loss.  The cement on the tibial talar talar surface was removed with a osteotome and rondure.  With removing the cement the dowel also came out of the canal without any concerns.  Using a reverse curette the canals were thoroughly debrided and irrigated.    Next we turned our attention to tibial preparation.  We reamed to the canal for a press-fit 135 mm stem based on preoperative templating.  A 16 mm reamer had excellent cortical purchase.  We then performed a freshen up cut of the proximal tibia at 0 degrees removing about 2 mm of bone..  We then sized the tibial component and relative to  the tibial canal the baseplate sat relatively posterior. We used a 3 mm offset and were able to bring the baseplate anterior.  A size E tibial tray had excellent coverage of the tibial bone.  There was minimal canal bone loss but for better rotational fixation elected to put a fixed cone.  This was prepared for broach.  Once we had the tibia aligned as desired we prepared the tibia for the keel.  A trial tibia with the stem was inserted.   Next we turned our attention to femoral preparation femoral sizing. We next turned our attention to preparing the femoral and and tibial canal for a 135 mm press-fit stem.  We sequentially reamed up to 16 mm which had good  cortical fit..  We performed a freshen up cut of the distal femur and had good distal bone through the 5 mm distal cut.  To help distalize the femur and restore the joint line we placed 10 mm augments on each side.   We felt based on a mild diameter and preop templating a size 9 femoral component was most appropriate and match the size of her prior implant.  The rotation of his prior implant does seem to match the transepicondylar access well, and the patient had good preoperative tracking.  To help close down the flexion space and to make the anterior cut in line with the prior component 6 mm offset was utilized position at the 5 o'clock position using a straight stem we had good position medial lateral and anterior posterior.  We then freshened up our anterior cut which matched the old rotation well.  Posteriorly we had good bony contact through the 5 mm cut.  Minimal bone resected off the chamfer cuts.  A trial femoral component was then implanted with 10 mm distal and 5 mm posterior augments on each side.  We felt this restore the joint line well about 30 mm from the medial epicondyle and 25 mm from the lateral upper condyle.  We did not at this point have a augment on the tibia.  We trialed with the PS poly liners.  We worked her way up to an 18 mm PS poly trial.  Which had good extension without hyperextension and good stability in flexion.  There was about 3 mm laxity both medially and laterally which was symmetric.  We felt we did not need the plus femur as the flexion gap was relatively stable.  Plan to add a 5 mm augment to the tibia and then plan for a 12 or 14 CPS of her CCK insert.   The tourniquet was then let down at 119 minutes and left down for 15 minutes.   Next we turned our attention to resurfacing of the patella.  The precut patella measured approximately 77m, and had relatively good residual bone stock.  The patella was cut freehand to 14 mm.  A 38 mm bone was selected which had good  coverage of the patella.  Using a trial patella poly and 2 point-to-point reduction clamps to reapproximate the arthrotomy the patella had excellent tracking.     The real components were then opened and assembled on the back table. The tourniquet was reinflated for cementing.the radial cone was inserted which had good stability in the tibial bone the tibial and femoral components were cemented using antibiotic cement.  we had good press-fit with our stems.  The knee was held in extension until our cement was set at about 16 minutes The knee  was irrigated with surgery for irrigation and normal saline pulse lavage.  The synovial lining was  then injected a dilute Exparel.  We again checked stability and felt 14 mm CPS insert had good extension good stability in flexion but about 2 to 3 mm of laxity both medial laterally.  Elected to place a CCK insert to help stabilize her in varus valgus..  The real polyethylene liner was then opened and inserted.  And the bolt was inserted and locked.  At this point we were pleased with our flexion extension gaps and range of motion, and patellar tracking was excellent.         The tourniquet was again let down.  No significant  hemostasis was required.  The medial parapatellar arthrotomy was then reapproximated using #1 Vicryl and #1 Stratafix sutures with the knee in 45 degrees of flexion.  The subcutaneous layer was closed with interrupted 0 Vicryl.  Remaining wound was closed with 2-0 Vicryl, and interrupted 3-0 Nylon  The knee was cleaned, dried, dressed with a Prevena incisional wound VAC.  The patient was then  brought to recovery room in stable condition, tolerating the procedure  well. There were no complications.  Post op recs: WB: 50% partial weightbearing with a walker for 6 weeks. Abx: ancef in house.  Plan to discharge with cefadroxil for a total of 3 months from the surgery. Imaging: PACU xrays DVT prophylaxis: Eliquis 2.5 mg twice daily starting  postop day 1.  Plan to resume Eliquis 5 mg twice daily on postop day 3 if her hemoglobin is stable. Follow up: 2 weeks after surgery for a wound check with Dr. Zachery Dakins at [facility].  Address: [address], [place], [place] [address]  Office Phone: [phone]  Charlies Constable, MD Orthopaedic Surgery

## 2021-12-20 NOTE — Transfer of Care (Signed)
Immediate Anesthesia Transfer of Care Note  Patient: Danielle [name]  Procedure(s) Performed: REMOVAL OF RIGHT KNEE ANTIBIOTIC SPACER AND REVISION RIGHT KNEE TOTAL ARTHROPLASTY (Right: Knee)  Patient Location: PACU  Anesthesia Type:General  Level of Consciousness: awake  Airway & Oxygen Therapy: Patient Spontanous Breathing and Patient connected to face mask oxygen  Post-op Assessment: Report given to RN and Post -op Vital signs reviewed and stable  Post vital signs: Reviewed and stable  Last Vitals:  Vitals Value Taken Time  BP 173/80 12/20/21 1526  Temp    Pulse 83 12/20/21 1528  Resp 14 12/20/21 1528  SpO2 94 % 12/20/21 1528  Vitals shown include unvalidated device data.  Last Pain:  Vitals:   12/20/21 0848  PainSc: 5          Complications: No notable events documented.

## 2021-12-21 LAB — GLUCOSE, CAPILLARY
Glucose-Capillary: 167 mg/dL — ABNORMAL HIGH (ref 70–99)
Glucose-Capillary: 171 mg/dL — ABNORMAL HIGH (ref 70–99)
Glucose-Capillary: 131 mg/dL — ABNORMAL HIGH (ref 70–99)
Glucose-Capillary: 107 mg/dL — ABNORMAL HIGH (ref 70–99)

## 2021-12-21 LAB — CBC
HCT: 29.5 % — ABNORMAL LOW (ref 36.0–46.0)
Hemoglobin: 9.3 g/dL — ABNORMAL LOW (ref 12.0–15.0)
MCH: 20.9 pg — ABNORMAL LOW (ref 26.0–34.0)
MCHC: 31.5 g/dL (ref 30.0–36.0)
MCV: 66.4 fL — ABNORMAL LOW (ref 80.0–100.0)
Platelets: 219 10*3/uL (ref 150–400)
RBC: 4.44 MIL/uL (ref 3.87–5.11)
RDW: 17 % — ABNORMAL HIGH (ref 11.5–15.5)
WBC: 10.3 10*3/uL (ref 4.0–10.5)
nRBC: 0 % (ref 0.0–0.2)

## 2021-12-21 LAB — BASIC METABOLIC PANEL
Anion gap: 9 (ref 5–15)
BUN: 21 mg/dL (ref 8–23)
CO2: 25 mmol/L (ref 22–32)
Calcium: 8.9 mg/dL (ref 8.9–10.3)
Chloride: 103 mmol/L (ref 98–111)
Creatinine, Ser: 1.19 mg/dL — ABNORMAL HIGH (ref 0.44–1.00)
GFR, Estimated: 48 mL/min — ABNORMAL LOW (ref >60)
Glucose, Bld: 175 mg/dL — ABNORMAL HIGH (ref 70–99)
Potassium: 5 mmol/L (ref 3.5–5.1)
Sodium: 137 mmol/L (ref 135–145)

## 2021-12-21 MED ORDER — INSULIN GLARGINE-YFGN 100 UNIT/ML ~~LOC~~ SOLN
25.0000 [IU] | Freq: Every day | SUBCUTANEOUS | Status: DC
  Administered 2021-12-21 – 2022-01-02 (×12): 25 [IU] via SUBCUTANEOUS
  Filled 2021-12-21 (×14): qty 0.25

## 2021-12-21 NOTE — Progress Notes (Addendum)
Physical Therapy Treatment Patient Details Name: Danielle [name] MRN: [id number] DOB: 1945/10/02 Today's Date: 12/21/2021   History of Present Illness 76 y.o. female who presents 12/20/21 for FAILED TOTAL KNEE RIGHT. The patient has a history of pain and functional disability in the right knee due to infected TKA prosthesis. Joint prothesis explant with placement of antibiotic spacer in 06/2021; 7/26 Rt TKA revision   PMH: chronic combined systolic and diastolic CHF, CKD stage III, IDDM, HTN, bilateral knee replacement, breast cancer s/p chemo and lumpectomy, OSA on CPAP, hypothyroidism.    PT Comments    Patient eager to return to bed and did not want to try to ambulate. Agreed to minimal exercises (ankle pumps and seated knee flexion). Min assist for stand-pivot with increased time due to pt's slow processing and at times stepping in the wrong direction in order to get closer to the bed (difficulty stepping backwards). Patient tearful during session and daughter indicated they have a family member that is not doing well and may die, so patient has a lot on her mind.     Recommendations for follow up therapy are one component of a multi-disciplinary discharge planning process, led by the attending physician.  Recommendations may be updated based on patient status, additional functional criteria and insurance authorization.  Follow Up Recommendations  Follow physician's recommendations for discharge plan and follow up therapies     Assistance Recommended at Discharge Frequent or constant Supervision/Assistance  Patient can return home with the following Two people to help with walking and/or transfers;Assistance with cooking/housework;Direct supervision/assist for medications management;Direct supervision/assist for financial management;Assist for transportation;Help with stairs or ramp for entrance   Equipment Recommendations  None recommended by PT    Recommendations for Other Services        Precautions / Restrictions Precautions Precautions: Fall;Knee Precaution Comments: discussed no pillow under knee; placed in bone foam Restrictions Weight Bearing Restrictions: Yes RLE Weight Bearing: Partial weight bearing RLE Partial Weight Bearing Percentage or Pounds: 50%     Mobility  Bed Mobility Overal bed mobility: Needs Assistance Bed Mobility: Sit to Supine     Supine to sit:  (with rail) Sit to supine: Min assist   General bed mobility comments: assist to RLE    Transfers Overall transfer level: Needs assistance Equipment used: Rolling walker (2 wheels) Transfers: Sit to/from Stand, Bed to chair/wheelchair/BSC Sit to Stand: Min assist   Step pivot transfers: Min assist       General transfer comment: from recliner with light assist; step-by-step cues needed for how to pivot to sit on bed to her left; better processing than earlier, but still some confusion over stepping backwards and not forwards    Ambulation/Gait               General Gait Details: pivotal steps only with difficulty turning and stepping backwards   Stairs             Wheelchair Mobility    Modified Rankin (Stroke Patients Only)       Balance                                            Cognition Arousal/Alertness: Awake/alert Behavior During Therapy: Anxious Overall Cognitive Status: Impaired/Different from baseline Area of Impairment: Attention, Following commands, Safety/judgement, Problem solving  Orientation Level: Time Current Attention Level: Focused   Following Commands: Follows one step commands inconsistently, Follows one step commands with increased time Safety/Judgement: Decreased awareness of safety, Decreased awareness of deficits   Problem Solving: Slow processing, Decreased initiation, Difficulty sequencing, Requires verbal cues, Requires tactile cues General Comments: very focused on getting back to bed;  much better at following simple step-by-step instructions for sequencing with pivot        Exercises Total Joint Exercises Ankle Circles/Pumps: AROM, Right, 10 reps   Knee Flexion: AROM, Right, Seated (~40 degrees)    General Comments General comments (skin integrity, edema, etc.): on 2L throughout      Pertinent Vitals/Pain Pain Assessment Pain Assessment: 0-10 Pain Score: 10-Worst pain ever Pain Location: rt knee Pain Descriptors / Indicators: Constant, Operative site guarding, Crying Pain Intervention(s): Limited activity within patient's tolerance, Monitored during session, Repositioned, Patient requesting pain meds-RN notified, Relaxation    Home Living Family/patient expects to be discharged to:: Skilled nursing facility Living Arrangements: Alone                      Prior Function            PT Goals (current goals can now be found in the care plan section) Acute Rehab PT Goals Patient Stated Goal: to go to SNF PT Goal Formulation: With patient Time For Goal Achievement: 01/04/22 Potential to Achieve Goals: Good Progress towards PT goals: Progressing toward goals    Frequency    Min 5X/week      PT Plan Current plan remains appropriate    Co-evaluation              AM-PAC PT "6 Clicks" Mobility   Outcome Measure  Help needed turning from your back to your side while in a flat bed without using bedrails?: A Lot Help needed moving from lying on your back to sitting on the side of a flat bed without using bedrails?: A Lot Help needed moving to and from a bed to a chair (including a wheelchair)?: A Lot Help needed standing up from a chair using your arms (e.g., wheelchair or bedside chair)?: A Little Help needed to walk in hospital room?: Total Help needed climbing 3-5 steps with a railing? : Total 6 Click Score: 11    End of Session Equipment Utilized During Treatment: Gait belt Activity Tolerance: Patient limited by pain;Other  (comment) (decr cognition) Patient left: with call bell/phone within reach;in bed;with family/visitor present Nurse Communication: Mobility status PT Visit Diagnosis: Unsteadiness on feet (R26.81);Difficulty in walking, not elsewhere classified (R26.2);Muscle weakness (generalized) (M62.81);Pain Pain - Right/Left: Right Pain - part of body: Knee     Time: 6440-3474 PT Time Calculation (min) (ACUTE ONLY): 31 min  Charges:  $Gait Training: 23-37 mins $Therapeutic Exercise: 8-22 mins                      [place]  Office [phone]    Rexanne Mano 12/21/2021, 2:13 PM

## 2021-12-21 NOTE — Progress Notes (Signed)
Subjective:  Patient doing well overall.  Pain moderately well controlled with PRN pain meds. Discussed plan for physical therapy this morning.  She denies distal numbness and tingling.  Discussed Intra-Op findings.  All questions answered.  Objective:   VITALS:   Vitals:   12/20/21 1925 12/20/21 1955 12/20/21 2037 12/21/21 0000  BP: (!) 171/82 (!) 161/88 (!) 148/83 (!) 154/74  Pulse: 96 94 94   Resp:   16 16  Temp:   99.1 F (37.3 C) 98.9 F (37.2 C)  TempSrc:   Oral Oral  SpO2: 99% 100% 100%   Weight:      Height:        Sensation intact distally Intact pulses distally Dorsiflexion/Plantar flexion intact Incision: Incisional Prevena wound VAC holding suction no output in canister or tubing. Compartment soft   Lab Results  Component Value Date   WBC 10.3 12/21/2021   HGB 9.3 (L) 12/21/2021   HCT 29.5 (L) 12/21/2021   MCV 66.4 (L) 12/21/2021   PLT 219 12/21/2021   BMET    Component Value Date/Time   NA 137 12/21/2021 0355   K 5.0 12/21/2021 0355   CL 103 12/21/2021 0355   CO2 25 12/21/2021 0355   GLUCOSE 175 (H) 12/21/2021 0355   BUN 21 12/21/2021 0355   CREATININE 1.19 (H) 12/21/2021 0355   CREATININE 1.04 (H) 09/21/2021 1211   CALCIUM 8.9 12/21/2021 0355   EGFR 56 (L) 09/21/2021 1211   GFRNONAA 48 (L) 12/21/2021 0355      Xray: Postop x-rays demonstrate total knee arthroplasty components in excellent position without adverse features  Assessment/Plan: 1 Day Post-Op   Principal Problem:   Prosthetic joint implant failure, initial encounter (HCC)  Second stage right total knee revision replant for MSSA prosthetic joint infection 12/20/2021 Anemia stable: 9.3 today on postop day 1.  Post op recs: WB: 50% partial weightbearing with a walker for 6 weeks. Abx: ancef in house.  Plan to discharge with cefadroxil for a total of 3 months from the surgery. Imaging: PACU xrays DVT prophylaxis: Eliquis 2.5 mg twice daily starting postop day 1.  Plan to  resume Eliquis 5 mg twice daily on postop day 3 if her hemoglobin is stable. Follow up: 2 weeks after surgery for a wound check with Dr. Marchwiany at [facility].  Address: [address], [place], [place] [address]  Office Phone: [phone]   Daniel Marchwiany, MD Orthopaedic Surgery      DANIEL A MARCHWIANY 12/21/2021, 7:18 AM   Daniel Marchwiany, MD  Contact information:   Weekdays 7am-5pm epic message Dr. Marchwiany, or call office for patient follow up: [phone] After hours and holidays please check [email or web address] for group call information for [facility]    

## 2021-12-21 NOTE — Progress Notes (Signed)
Foley removed w/o complications. Pt is due to void. Purewick to be added.

## 2021-12-21 NOTE — Progress Notes (Signed)
PT Cancellation Note  Patient Details Name: Danielle [name] MRN: [id number] DOB: [birth year]   Cancelled Treatment:    Reason Eval/Treat Not Completed: Pain limiting ability to participate  Patient requesting pain medication. RN aware and in to provide. Will reattempt after meds have a chance to take effect.    [place]  Office [phone]  Rexanne Mano 12/21/2021, 9:43 AM

## 2021-12-21 NOTE — Progress Notes (Signed)
Pt  alert but drowsy per shift. Pt slept majority of the night.

## 2021-12-21 NOTE — Evaluation (Signed)
Physical Therapy Evaluation Patient Details Name: Danielle [name] MRN: [id number] DOB: 1945-08-19 Today's Date: 12/21/2021  History of Present Illness  76 y.o. female who presents 12/20/21 for FAILED TOTAL KNEE RIGHT. The patient has a history of pain and functional disability in the right knee due to infected TKA prosthesis. Joint prothesis explant with placement of antibiotic spacer in 06/2021; 7/26 Rt TKA revision   PMH: chronic combined systolic and diastolic CHF, CKD stage III, IDDM, HTN, bilateral knee replacement, breast cancer s/p chemo and lumpectomy, OSA on CPAP, hypothyroidism.  Clinical Impression   Patient is s/p above surgery resulting in functional limitations due to the deficits listed below (see PT Problem List). Patient with significant difficulty coordinating how to step-pivot and step backwards to the chair, requiring chair brought to her and mod assist to sit down. Patient and family want SNF for additional therapies prior to return home as pt lives alone.  Patient will benefit from skilled PT to increase their independence and safety with mobility to allow discharge to the venue listed below.          Recommendations for follow up therapy are one component of a multi-disciplinary discharge planning process, led by the attending physician.  Recommendations may be updated based on patient status, additional functional criteria and insurance authorization.  Follow Up Recommendations Follow physician's recommendations for discharge plan and follow up therapies      Assistance Recommended at Discharge Frequent or constant Supervision/Assistance  Patient can return home with the following  Two people to help with walking and/or transfers;Assistance with cooking/housework;Direct supervision/assist for medications management;Direct supervision/assist for financial management;Assist for transportation;Help with stairs or ramp for entrance    Equipment Recommendations None recommended  by PT  Recommendations for Other Services       Functional Status Assessment Patient has had a recent decline in their functional status and demonstrates the ability to make significant improvements in function in a reasonable and predictable amount of time.     Precautions / Restrictions Precautions Precautions: Fall;Knee Precaution Comments: discussed no pillow under knee Restrictions Weight Bearing Restrictions: Yes RLE Weight Bearing: Partial weight bearing RLE Partial Weight Bearing Percentage or Pounds: 50%      Mobility  Bed Mobility Overal bed mobility: Needs Assistance Bed Mobility: Supine to Sit     Supine to sit: Min assist, HOB elevated (with rail)     General bed mobility comments: assist to take RLE over EOB and control lowering it to the floor; pt using HOB elevated and rail to come to sit EOB    Transfers Overall transfer level: Needs assistance Equipment used: Rolling walker (2 wheels) Transfers: Sit to/from Stand, Bed to chair/wheelchair/BSC Sit to Stand: Min assist   Step pivot transfers: Mod assist       General transfer comment: stood from EOB with light min assist; initial several steps toward bed made easily and then pt having difficulty understanding how to pivot and back up to chair (kept stepping feet forward instead of backwards as instructed). Chair brought underneath patient and required incr time and assist to reach back to armrests to lower to sitting.    Ambulation/Gait               General Gait Details: pivotal steps only with difficulty turning and stepping backwards  Stairs            Wheelchair Mobility    Modified Rankin (Stroke Patients Only)       Balance  Pertinent Vitals/Pain Pain Assessment Pain Assessment: 0-10 Pain Score: 10-Worst pain ever Pain Location: rt knee Pain Descriptors / Indicators: Constant, Operative site guarding Pain  Intervention(s): Limited activity within patient's tolerance, Monitored during session, Premedicated before session, Repositioned    Home Living Family/patient expects to be discharged to:: Skilled nursing facility Living Arrangements: Alone                      Prior Function Prior Level of Function : Independent/Modified Independent                     Hand Dominance   Dominant Hand: Right    Extremity/Trunk Assessment   Upper Extremity Assessment Upper Extremity Assessment: Generalized weakness    Lower Extremity Assessment Lower Extremity Assessment: Generalized weakness;RLE deficits/detail RLE Deficits / Details: post-op dressing and edema as anticipated; able to flex ~45 degrees in sitting RLE: Unable to fully assess due to pain    Cervical / Trunk Assessment Cervical / Trunk Assessment: Other exceptions Cervical / Trunk Exceptions: overweight  Communication   Communication: No difficulties  Cognition Arousal/Alertness: Awake/alert Behavior During Therapy: Anxious Overall Cognitive Status: Impaired/Different from baseline Area of Impairment: Orientation, Attention, Following commands, Safety/judgement, Problem solving                 Orientation Level: Time Current Attention Level: Focused   Following Commands: Follows one step commands inconsistently, Follows one step commands with increased time Safety/Judgement: Decreased awareness of safety, Decreased awareness of deficits   Problem Solving: Slow processing, Decreased initiation, Difficulty sequencing, Requires verbal cues, Requires tactile cues General Comments: pt very distracted by family in the room and asked them to leave; pt then internally distracted by pain and required repetition of cues and manual facilitation to complete tasks        General Comments General comments (skin integrity, edema, etc.): on 2L throughout session with sats 97-100% throughout    Exercises Total  Joint Exercises Ankle Circles/Pumps: AROM, Right, 10 reps Quad Sets: AROM, Right, 10 reps Heel Slides: AAROM, Right, 10 reps Knee Flexion: AROM, Right, Seated   Assessment/Plan    PT Assessment Patient needs continued PT services  PT Problem List Decreased strength;Decreased activity tolerance;Decreased range of motion;Decreased balance;Decreased mobility;Decreased cognition;Decreased knowledge of use of DME;Decreased safety awareness;Decreased knowledge of precautions;Obesity;Pain       PT Treatment Interventions DME instruction;Gait training;Functional mobility training;Therapeutic activities;Therapeutic exercise;Cognitive remediation;Patient/family education    PT Goals (Current goals can be found in the Care Plan section)  Acute Rehab PT Goals Patient Stated Goal: to go to SNF PT Goal Formulation: With patient Time For Goal Achievement: 01/04/22 Potential to Achieve Goals: Good    Frequency Min 5X/week     Co-evaluation               AM-PAC PT "6 Clicks" Mobility  Outcome Measure Help needed turning from your back to your side while in a flat bed without using bedrails?: A Lot Help needed moving from lying on your back to sitting on the side of a flat bed without using bedrails?: A Lot Help needed moving to and from a bed to a chair (including a wheelchair)?: A Lot Help needed standing up from a chair using your arms (e.g., wheelchair or bedside chair)?: A Little Help needed to walk in hospital room?: Total Help needed climbing 3-5 steps with a railing? : Total 6 Click Score: 11    End of Session Equipment Utilized During Treatment:  Gait belt Activity Tolerance: Patient limited by pain;Other (comment) (decr cognition) Patient left: in chair;with call bell/phone within reach;with chair alarm set Nurse Communication: Mobility status PT Visit Diagnosis: Unsteadiness on feet (R26.81);Difficulty in walking, not elsewhere classified (R26.2);Muscle weakness (generalized)  (M62.81);Pain Pain - Right/Left: Right Pain - part of body: Knee    Time: 8329-1916 PT Time Calculation (min) (ACUTE ONLY): 44 min   Charges:   PT Evaluation $PT Eval Low Complexity: 1 Low PT Treatments $Gait Training: 8-22 mins $Therapeutic Exercise: 8-22 mins         Arby Barrette, PT Acute Rehabilitation Services  Office [phone]   Rexanne Mano 12/21/2021, 12:25 PM

## 2021-12-21 NOTE — Progress Notes (Signed)
Inpatient Diabetes Program Recommendations  AACE/ADA: New Consensus Statement on Inpatient Glycemic Control (2015)  Target Ranges:  Prepandial:   less than 140 mg/dL      Peak postprandial:   less than 180 mg/dL (1-2 hours)      Critically ill patients:  140 - 180 mg/dL   Lab Results  Component Value Date   GLUCAP 171 (H) 12/21/2021   HGBA1C 7.7 (H) 12/11/2021    Review of Glycemic Control  Latest Reference Range & Units 12/20/21 16:32 12/20/21 20:39 12/21/21 07:48 12/21/21 11:50  Glucose-Capillary 70 - 99 mg/dL 184 (H) 197 (H) 167 (H) 171 (H)   Diabetes history: DM 2 Outpatient Diabetes medications:  Jardiance 10 mg daily Basaglar 45 units q HS Humalog 5 units tid with meals Current orders for Inpatient glycemic control:  Novolog 0-15 units tid with meals Novolog 6 units tid with meals Jardiance 10 mg daily  Inpatient Diabetes Program Recommendations:    Referral received regarding DM management.  Consider adding Semglee 25 units q HS (1/2 of home dose).   Thanks,  Adah Perl, RN, BC-ADM Inpatient Diabetes Coordinator Pager [phone]  (8a-5p)

## 2021-12-22 ENCOUNTER — Telehealth (HOSPITAL_COMMUNITY): Payer: Self-pay | Admitting: Licensed Clinical Social Worker

## 2021-12-22 LAB — CBC
HCT: 26.9 % — ABNORMAL LOW (ref 36.0–46.0)
Hemoglobin: 8.3 g/dL — ABNORMAL LOW (ref 12.0–15.0)
MCH: 20.9 pg — ABNORMAL LOW (ref 26.0–34.0)
MCHC: 30.9 g/dL (ref 30.0–36.0)
MCV: 67.8 fL — ABNORMAL LOW (ref 80.0–100.0)
Platelets: 166 10*3/uL (ref 150–400)
RBC: 3.97 MIL/uL (ref 3.87–5.11)
RDW: 17.1 % — ABNORMAL HIGH (ref 11.5–15.5)
WBC: 11.8 10*3/uL — ABNORMAL HIGH (ref 4.0–10.5)
nRBC: 0 % (ref 0.0–0.2)

## 2021-12-22 LAB — GLUCOSE, CAPILLARY
Glucose-Capillary: 137 mg/dL — ABNORMAL HIGH (ref 70–99)
Glucose-Capillary: 127 mg/dL — ABNORMAL HIGH (ref 70–99)
Glucose-Capillary: 101 mg/dL — ABNORMAL HIGH (ref 70–99)
Glucose-Capillary: 149 mg/dL — ABNORMAL HIGH (ref 70–99)

## 2021-12-22 MED ORDER — FERROUS SULFATE 325 (65 FE) MG PO TABS
325.0000 mg | ORAL_TABLET | Freq: Two times a day (BID) | ORAL | Status: DC
  Administered 2021-12-22 – 2022-01-03 (×24): 325 mg via ORAL
  Filled 2021-12-22 (×26): qty 1

## 2021-12-22 MED ORDER — ADULT MULTIVITAMIN W/MINERALS CH
1.0000 | ORAL_TABLET | Freq: Every day | ORAL | Status: DC
  Administered 2021-12-22 – 2022-01-03 (×13): 1 via ORAL
  Filled 2021-12-22 (×13): qty 1

## 2021-12-22 MED ORDER — FOLIC ACID 1 MG PO TABS
1.0000 mg | ORAL_TABLET | Freq: Every day | ORAL | Status: DC
  Administered 2021-12-22 – 2022-01-03 (×13): 1 mg via ORAL
  Filled 2021-12-22 (×13): qty 1

## 2021-12-22 MED ORDER — GABAPENTIN 300 MG PO CAPS
300.0000 mg | ORAL_CAPSULE | Freq: Two times a day (BID) | ORAL | Status: DC
  Administered 2021-12-22 – 2022-01-03 (×25): 300 mg via ORAL
  Filled 2021-12-22 (×25): qty 1

## 2021-12-22 NOTE — Care Management Important Message (Signed)
Important Message  Patient Details  Name: Danielle [name] MRN: [id number] Date of Birth: 1946-03-12   Medicare Important Message Given:  Yes     Angelika Jerrett Montine Circle 12/22/2021, 3:42 PM

## 2021-12-22 NOTE — NC FL2 (Signed)
[place] MEDICAID FL2 LEVEL OF CARE SCREENING TOOL     IDENTIFICATION  Patient Name: Danielle [name] Birthdate: [birth year] Sex: female Admission Date (Current Location): 12/20/2021  [facility] and [place] Number:  Herbalist and Address:  [facility], [place] [address] [place], [place] [address]      Provider Number: [id number]  Attending Physician Name and Address:  Willaim Sheng, MD  Relative Name and Phone Number:  [name], Wigle Daughter [phone]    Current Level of Care: Hospital Recommended Level of Care: [place] Prior Approval Number:    Date Approved/Denied:   PASRR Number: [id number]  Discharge Plan: SNF    Current Diagnoses: Patient Active Problem List   Diagnosis Date Noted   Prosthetic joint implant failure, initial encounter ([place]) 12/20/2021   Pseudophakia of both eyes 11/15/2021   DVT (deep venous thrombosis) ([place]) 07/31/2021   Acute blood loss anemia    Septic Arthritis, Infection of prosthetic right knee joint ([place]), MSSA infection 50/93/2671   Complication of internal right knee prosthesis ([place]) 07/10/2021   CAD (coronary artery disease) 06/08/2021   Carotid artery disease ([place]) 06/08/2021   Symptomatic bradycardia 06/08/2021   Paroxysmal atrial fibrillation ([place]) 06/08/2021   CKD (chronic kidney disease), stage III ([place]) 06/08/2021   Polyneuropathy associated with underlying disease ([place]) 05/04/2020   Severe nonproliferative diabetic retinopathy of left eye, with macular edema, associated with type 2 diabetes mellitus ([place]) 11/05/2019   Severe nonproliferative diabetic retinopathy of right eye, with macular edema, associated with type 2 diabetes mellitus ([place]) 11/05/2019   Retinal hemorrhage of right eye 11/05/2019   Retinal hemorrhage of left eye 11/05/2019   Diabetes mellitus ([place]) 05/14/2019   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, with long-term  current use of insulin ([place]) 05/14/2019   Type 2 diabetes mellitus with diabetic polyneuropathy, with long-term current use of insulin ([place]) 05/14/2019   History of gout 05/20/2018   Primary localized osteoarthritis of right knee 05/13/2018   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) 12/20/2017   Obesity (BMI 30-39.9) 11/11/2017   OSA (obstructive sleep apnea) 02/18/2017   Essential hypertension    L Breast Cancer    Hypothyroidism     Orientation RESPIRATION BLADDER Height & Weight     Self, Situation, Place  Normal Continent Weight: 204 lb (92.5 kg) Height:  '5\' 4"'$  (162.6 cm)  BEHAVIORAL SYMPTOMS/MOOD NEUROLOGICAL BOWEL NUTRITION STATUS      Continent Diet (see discharge summary)  AMBULATORY STATUS COMMUNICATION OF NEEDS Skin   Total Care Verbally Surgical wounds                       Personal Care Assistance Level of Assistance  Bathing, Feeding, Dressing Bathing Assistance: Limited assistance Feeding assistance: Independent Dressing Assistance: Limited assistance     Functional Limitations Info  Sight, Hearing, Speech Sight Info: Adequate Hearing Info: Adequate Speech Info: Adequate    SPECIAL CARE FACTORS FREQUENCY  PT (By licensed PT), OT (By licensed OT)     PT Frequency: 5x week OT Frequency: 5x week            Contractures Contractures Info: Not present    Additional Factors Info  Code Status, Allergies, Insulin Sliding Scale Code Status Info: full Allergies Info: Other, Tape, Sulfa Antibiotics, Uloric (Febuxostat)   Insulin Sliding Scale Info: Novolog: see discharge summary       Current Medications (12/22/2021):  This is the current hospital  active medication list Current Facility-Administered Medications  Medication Dose Route Frequency Provider Last Rate Last Admin   acetaminophen (TYLENOL) tablet 325-650 mg  325-650 mg Oral Q6H PRN Willaim Sheng, MD   650 mg at 12/22/21 5956   allopurinol (ZYLOPRIM) tablet 100 mg  100 mg Oral  Daily Willaim Sheng, MD   100 mg at 12/22/21 3875   apixaban (ELIQUIS) tablet 2.5 mg  2.5 mg Oral Q12H Willaim Sheng, MD   2.5 mg at 12/22/21 6433   atorvastatin (LIPITOR) tablet 80 mg  80 mg Oral QHS Willaim Sheng, MD   80 mg at 12/21/21 2148   carvedilol (COREG) tablet 3.125 mg  3.125 mg Oral BID WC Willaim Sheng, MD   3.125 mg at 12/22/21 2951   ceFAZolin (ANCEF) IVPB 2g/100 mL premix  2 g Intravenous Q8H Willaim Sheng, MD 200 mL/hr at 12/22/21 0255 2 g at 12/22/21 0255   diphenhydrAMINE (BENADRYL) 12.5 MG/5ML elixir 12.5-25 mg  12.5-25 mg Oral Q4H PRN Willaim Sheng, MD   12.5 mg at 12/21/21 2031   docusate sodium (COLACE) capsule 100 mg  100 mg Oral BID Willaim Sheng, MD   100 mg at 12/22/21 8841   empagliflozin (JARDIANCE) tablet 10 mg  10 mg Oral Daily Willaim Sheng, MD   10 mg at 12/22/21 6606   ferrous sulfate tablet 325 mg  325 mg Oral BID AC Willaim Sheng, MD   325 mg at 30/16/01 0932   folic acid (FOLVITE) tablet 1 mg  1 mg Oral Daily Willaim Sheng, MD   1 mg at 12/22/21 3557   gabapentin (NEURONTIN) capsule 300 mg  300 mg Oral BID Willaim Sheng, MD   300 mg at 12/22/21 3220   hydrALAZINE (APRESOLINE) injection 10 mg  10 mg Intravenous Q8H PRN Willaim Sheng, MD   10 mg at 12/20/21 1713   HYDROmorphone (DILAUDID) injection 0.5-1 mg  0.5-1 mg Intravenous Q4H PRN Willaim Sheng, MD   1 mg at 12/21/21 1522   insulin aspart (novoLOG) injection 0-15 Units  0-15 Units Subcutaneous TID WC Willaim Sheng, MD   2 Units at 12/22/21 2542   insulin aspart (novoLOG) injection 6 Units  6 Units Subcutaneous TID WC Willaim Sheng, MD   6 Units at 12/22/21 7062   insulin glargine-yfgn (SEMGLEE) injection 25 Units  25 Units Subcutaneous QHS Willaim Sheng, MD   25 Units at 12/21/21 2149   lactated ringers infusion   Intravenous Continuous Willaim Sheng, MD   Stopped at 12/21/21 [id number]    levothyroxine (SYNTHROID) tablet 75 mcg  75 mcg Oral QAC breakfast Onnie Boer Q, RPH-CPP   75 mcg at 12/22/21 0519   losartan (COZAAR) tablet 25 mg  25 mg Oral Daily Willaim Sheng, MD   25 mg at 12/22/21 8315   menthol-cetylpyridinium (CEPACOL) lozenge 3 mg  1 lozenge Oral PRN Willaim Sheng, MD       Or   phenol (CHLORASEPTIC) mouth spray 1 spray  1 spray Mouth/Throat PRN Willaim Sheng, MD       methocarbamol (ROBAXIN) tablet 500 mg  500 mg Oral Q6H PRN Willaim Sheng, MD   500 mg at 12/21/21 1244   Or   methocarbamol (ROBAXIN) 500 mg in dextrose 5 % 50 mL IVPB  500 mg Intravenous Q6H PRN Willaim Sheng, MD       multivitamin with minerals tablet 1 tablet  1 tablet Oral Daily Willaim Sheng, MD   1 tablet at 12/22/21 3005   ondansetron (ZOFRAN) tablet 4 mg  4 mg Oral Q6H PRN Willaim Sheng, MD       Or   ondansetron [facility]) injection 4 mg  4 mg Intravenous Q6H PRN Willaim Sheng, MD       oxyCODONE (Oxy IR/ROXICODONE) immediate release tablet 10-15 mg  10-15 mg Oral Q4H PRN Willaim Sheng, MD   10 mg at 12/22/21 0250   oxyCODONE (Oxy IR/ROXICODONE) immediate release tablet 5-10 mg  5-10 mg Oral Q4H PRN Willaim Sheng, MD   5 mg at 12/22/21 1102   pantoprazole (PROTONIX) EC tablet 40 mg  40 mg Oral Daily Willaim Sheng, MD   40 mg at 12/22/21 1117   polyethylene glycol (MIRALAX / GLYCOLAX) packet 17 g  17 g Oral Daily PRN Willaim Sheng, MD       spironolactone (ALDACTONE) tablet 12.5 mg  12.5 mg Oral Daily Willaim Sheng, MD   12.5 mg at 12/22/21 0929   zolpidem (AMBIEN) tablet 5 mg  5 mg Oral QHS PRN Willaim Sheng, MD         Discharge Medications: Please see discharge summary for a list of discharge medications.  Relevant Imaging Results:  Relevant Lab Results:   Additional Information SSN# [id number] Pt vaccinated and had one booster  Joanne Chars, LCSW

## 2021-12-22 NOTE — Progress Notes (Signed)
Physical Therapy Treatment Patient Details Name: Danielle [name] MRN: [id number] DOB: 1946/04/04 Today's Date: 12/22/2021   History of Present Illness 76 y.o. female who presents 12/20/21 for FAILED TOTAL KNEE RIGHT. The patient has a history of pain and functional disability in the right knee due to infected TKA prosthesis. Joint prothesis explant with placement of antibiotic spacer in 06/2021; 7/26 Rt TKA revision   PMH: chronic combined systolic and diastolic CHF, CKD stage III, IDDM, HTN, bilateral knee replacement, breast cancer s/p chemo and lumpectomy, OSA on CPAP, hypothyroidism.    PT Comments    Pt with poor tolerance to session and unable to perform stand pivot transfer to chair using RW 2/2 weakness, pain, and fear. Pt requiring max cues and increased time to complete bed mobility and transfers. Pt will be seen again in afternoon with pain medicine planned more ahead of session.    Recommendations for follow up therapy are one component of a multi-disciplinary discharge planning process, led by the attending physician.  Recommendations may be updated based on patient status, additional functional criteria and insurance authorization.  Follow Up Recommendations  Follow physician's recommendations for discharge plan and follow up therapies     Assistance Recommended at Discharge Frequent or constant Supervision/Assistance  Patient can return home with the following Two people to help with walking and/or transfers;Assistance with cooking/housework;Direct supervision/assist for medications management;Direct supervision/assist for financial management;Assist for transportation;Help with stairs or ramp for entrance   Equipment Recommendations  None recommended by PT    Recommendations for Other Services       Precautions / Restrictions Precautions Precautions: Fall;Knee Precaution Comments: pt unwilling to use bone foam after session complete Restrictions Weight Bearing Restrictions:  Yes RLE Weight Bearing: Partial weight bearing RLE Partial Weight Bearing Percentage or Pounds: 50%     Mobility  Bed Mobility Overal bed mobility: Needs Assistance Bed Mobility: Sit to Supine, Supine to Sit     Supine to sit: Max assist, HOB elevated, +2 for physical assistance Sit to supine: Max assist, +2 for physical assistance, HOB elevated   General bed mobility comments: Pt required several attempts at supine to sit transfer 2/2 fear of movement and R LE pain (knee and ankle). Attempted with several different techniques but pt very cautious. Sling pad, HHA, and bed rail utilized. Pt required support of her R LE while sitting EOB due to intolerance to knee bend.    Transfers Overall transfer level: Needs assistance Equipment used: Rolling walker (2 wheels) Transfers: Sit to/from Stand, Bed to chair/wheelchair/BSC Sit to Stand: Mod assist, From elevated surface, +2 safety/equipment Stand pivot transfers:  (pt unable) Step pivot transfers: Min assist       General transfer comment: Pt required increased time and attempts to complete sit to stand from bed. Pt required cues for hand placements and maintenance of WB status. Pt unable to perform stand pivot transfer from bed to chair 2/2 fear, pain, weakness, and fatigue (very minimal side step performed).    Ambulation/Gait               General Gait Details: Unable   Stairs             Wheelchair Mobility    Modified Rankin (Stroke Patients Only)       Balance Overall balance assessment: Needs assistance         Standing balance support: Reliant on assistive device for balance, Bilateral upper extremity supported Standing balance-Leahy Scale: Poor  Cognition Arousal/Alertness: Awake/alert (lethargic at start of session but pt able to stay awake with cues) Behavior During Therapy: Anxious Overall Cognitive Status: Impaired/Different from baseline Area of  Impairment: Attention, Following commands, Safety/judgement, Problem solving                   Current Attention Level: Focused   Following Commands: Follows one step commands inconsistently, Follows one step commands with increased time Safety/Judgement: Decreased awareness of safety, Decreased awareness of deficits   Problem Solving: Slow processing, Decreased initiation, Difficulty sequencing, Requires verbal cues, Requires tactile cues General Comments: Pt very fearful of movement and takes more than reasonable time to complete tasks. Pt A and O x 4        Exercises      General Comments General comments (skin integrity, edema, etc.): HR 93 and SpO2 97% on RA      Pertinent Vitals/Pain Pain Assessment Pain Assessment: 0-10 Pain Score: 8  Pain Location: rt knee Pain Descriptors / Indicators: Constant, Operative site guarding, Crying Pain Intervention(s): Limited activity within patient's tolerance, Monitored during session, Premedicated before session, Repositioned    Home Living                          Prior Function            PT Goals (current goals can now be found in the care plan section) Acute Rehab PT Goals Patient Stated Goal: to go to SNF PT Goal Formulation: With patient Time For Goal Achievement: 01/04/22 Potential to Achieve Goals: Good Progress towards PT goals: Progressing toward goals (slow)    Frequency    Min 5X/week      PT Plan Current plan remains appropriate    Co-evaluation              AM-PAC PT "6 Clicks" Mobility   Outcome Measure  Help needed turning from your back to your side while in a flat bed without using bedrails?: Total Help needed moving from lying on your back to sitting on the side of a flat bed without using bedrails?: Total Help needed moving to and from a bed to a chair (including a wheelchair)?: Total Help needed standing up from a chair using your arms (e.g., wheelchair or bedside  chair)?: A Lot Help needed to walk in hospital room?: Total Help needed climbing 3-5 steps with a railing? : Total 6 Click Score: 7    End of Session Equipment Utilized During Treatment: Gait belt Activity Tolerance: Patient limited by pain;Other (comment) (decr cognition and fear) Patient left: with call bell/phone within reach;in bed;with bed alarm set;with nursing/sitter in room Nurse Communication: Mobility status PT Visit Diagnosis: Unsteadiness on feet (R26.81);Difficulty in walking, not elsewhere classified (R26.2);Muscle weakness (generalized) (M62.81);Pain Pain - Right/Left: Right Pain - part of body: Knee     Time: 5573-2202 PT Time Calculation (min) (ACUTE ONLY): 24 min  Charges:  $Therapeutic Activity: 23-37 mins                     Donna Bernard, PT    [facility] 12/22/2021, 10:29 AM

## 2021-12-22 NOTE — Progress Notes (Signed)
     Subjective: Patient doing well overall.  Hgb 8.3 this AM. Glucose under 200, diabetic coordinator recs appreciated. Ms. Craigie worked well with PT yesterday getting up to recliner. Pain has been moderately well controlled. Daughter at bedside this morning.  Objective:   VITALS:   Vitals:   12/21/21 0000 12/21/21 0747 12/21/21 1445 12/21/21 1959  BP: (!) 154/74 (!) 121/57 101/61 127/62  Pulse:  78 80 82  Resp: 16   18  Temp: 98.9 F (37.2 C) 98.8 F (37.1 C) 97.9 F (36.6 C) 98.2 F (36.8 C)  TempSrc: Oral Oral Oral   SpO2:  99% 100% 100%  Weight:      Height:        Sensation intact distally Intact pulses distally Dorsiflexion/Plantar flexion intact Incision: Incisional Prevena wound VAC holding suction no output in canister or tubing. Compartment soft   Lab Results  Component Value Date   WBC 11.8 (H) 12/22/2021   HGB 8.3 (L) 12/22/2021   HCT 26.9 (L) 12/22/2021   MCV 67.8 (L) 12/22/2021   PLT 166 12/22/2021   BMET    Component Value Date/Time   NA 137 12/21/2021 0355   K 5.0 12/21/2021 0355   CL 103 12/21/2021 0355   CO2 25 12/21/2021 0355   GLUCOSE 175 (H) 12/21/2021 0355   BUN 21 12/21/2021 0355   CREATININE 1.19 (H) 12/21/2021 0355   CREATININE 1.04 (H) 09/21/2021 1211   CALCIUM 8.9 12/21/2021 0355   EGFR 56 (L) 09/21/2021 1211   GFRNONAA 48 (L) 12/21/2021 0355    Xray: Postop x-rays demonstrate revision total knee arthroplasty components in excellent position without adverse features  Assessment/Plan: 2 Days Post-Op   Principal Problem:   Prosthetic joint implant failure, initial encounter ([place])  Second stage right total knee revision replant for MSSA prosthetic joint infection 12/20/2021 Anemia stable: 8.3 today on postop day 2. Will recheck POD3 and resume eliquis $RemoveBefore'5mg'lZTckrytgmeUU$  if stable otherwise stay at eliquis 2.$RemoveBefo'5mg'juDArnahkhV$ .  Post op recs: WB: 50% partial weightbearing with a walker for 6 weeks. Abx: ancef in house.  Plan to discharge with cefadroxil  for a total of 3 months from the surgery. Imaging: PACU xrays DVT prophylaxis: Eliquis 2.5 mg twice daily starting postop day 1.  Plan to resume Eliquis 5 mg twice daily on postop day 3 if her hemoglobin is stable. Follow up: 1 week after surgery for a wound check with Dr. Zachery Dakins at [facility].  Address: [address] [place], [place], [place] [address]  Office Phone: [phone]   Charlies Constable, MD Orthopaedic Surgery      Jazman Reuter A [place] 12/22/2021, 6:15 AM   Charlies Constable, MD  Contact information:   [phone] 7am-5pm epic message Dr. Zachery Dakins, or call office for patient follow up: [phone] After hours and holidays please check [email or web address] for group call information for [facility]

## 2021-12-22 NOTE — Telephone Encounter (Signed)
H&V Care Navigation CSW Progress Note  Clinical Social Worker provided patient portion of BMS application for Eliquis assistance.  Completed application reviewed and signed by physician. Faxed in for review- fax confirmation received- awaiting determination.  Completed application placed on Patient Advocate desk for necessary follow up.  SDOH Screenings   Alcohol Screen: Not on file  Depression (PHQ2-9): Low Risk  (10/24/2021)   Depression (PHQ2-9)    PHQ-2 Score: 0  Financial Resource Strain: Medium Risk (12/22/2021)   Overall Financial Resource Strain (CARDIA)    Difficulty of Paying Living Expenses: Somewhat hard  Food Insecurity: No Food Insecurity (05/08/2018)   Hunger Vital Sign    Worried About Running Out of Food in the Last Year: Never true    Ran Out of Food in the Last Year: Never true  Housing: Low Risk  (05/08/2018)   Housing    Last Housing Risk Score: 0  Physical Activity: Not on file  Social Connections: Not on file  Stress: Not on file  Tobacco Use: Medium Risk (12/20/2021)   Patient History    Smoking Tobacco Use: Former    Smokeless Tobacco Use: Never    Passive Exposure: Not on file  Transportation Needs: No Transportation Needs (05/08/2018)   PRAPARE - Hydrologist (Medical): No    Lack of Transportation (Non-Medical): No    Jorge Ny, LCSW Clinical Social Worker [facility] Desk#: [phone] Cell#: [phone]

## 2021-12-22 NOTE — TOC Initial Note (Addendum)
Transition of Care [facility]) - Initial/Assessment Note    Patient Details  Name: Danielle [name] MRN: [id number] Date of Birth: 04-21-1946  Transition of Care [facility]) CM/SW Contact:    Joanne Chars, LCSW Phone Number: 12/22/2021, 11:03 AM  Clinical Narrative:    CSW spoke with pt and daugher [name] regarding DC recommendation for SNF.  Permission given to speak with all 3 daughters.  They are agreeable to SNF, requesting [facility], as pt has been there before.  Pt lives alone, no current home services.  Pt is vaccinated for covid, no boosters.       Accepted at [facility], [place] waiting for confirmation from [name] that she will start auth          Expected Discharge Plan: [place] Barriers to Discharge: Continued Medical Work up, SNF Pending bed offer   Patient Goals and CMS Choice Patient states their goals for this hospitalization and ongoing recovery are:: walk, run   Choice offered to / list presented to : Patient, Adult Children ([name])  Expected Discharge Plan and Services Expected Discharge Plan: [place] In-house Referral: Clinical Social Work   Post Acute Care Choice: [place] Living arrangements for the past 2 months: [place]                                      Prior Living Arrangements/Services Living arrangements for the past 2 months: Single Family Home Lives with:: Self Patient language and need for interpreter reviewed:: Yes Do you feel safe going back to the place where you live?: Yes      Need for Family Participation in Patient Care: Yes (Comment) Care giver support system in place?: Yes (comment) Current home services: Other (comment) (none) Criminal Activity/Legal Involvement Pertinent to Current Situation/Hospitalization: No - Comment as needed  Activities of Daily Living      Permission Sought/Granted Permission sought to share information with : Family  Supports Permission granted to share information with : Yes, Verbal Permission Granted  Share Information with NAME: all daughters  Permission granted to share info w AGENCY: [facility]        Emotional Assessment Appearance:: Appears stated age Attitude/Demeanor/Rapport: Engaged Affect (typically observed): Appropriate, Pleasant Orientation: : Oriented to Self, Oriented to Place, Oriented to  Time, Oriented to Situation Alcohol / Substance Use: Not Applicable Psych Involvement: No (comment)  Admission diagnosis:  Prosthetic joint implant failure, initial encounter [facility]) [T84.019A] Patient Active Problem List   Diagnosis Date Noted   Prosthetic joint implant failure, initial encounter ([place]) 12/20/2021   Pseudophakia of both eyes 11/15/2021   DVT (deep venous thrombosis) ([place]) 07/31/2021   Acute blood loss anemia    Septic Arthritis, Infection of prosthetic right knee joint ([place]), MSSA infection 17/40/8144   Complication of internal right knee prosthesis ([place]) 07/10/2021   CAD (coronary artery disease) 06/08/2021   Carotid artery disease ([place]) 06/08/2021   Symptomatic bradycardia 06/08/2021   Paroxysmal atrial fibrillation ([place]) 06/08/2021   CKD (chronic kidney disease), stage III ([place]) 06/08/2021   Polyneuropathy associated with underlying disease ([place]) 05/04/2020   Severe nonproliferative diabetic retinopathy of left eye, with macular edema, associated with type 2 diabetes mellitus ([place]) 11/05/2019   Severe nonproliferative diabetic retinopathy of right eye, with macular edema, associated with type 2 diabetes mellitus ([place]) 11/05/2019   Retinal hemorrhage of right eye 11/05/2019   Retinal hemorrhage  of left eye 11/05/2019   Diabetes mellitus ([place]) 05/14/2019   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, with long-term current use of insulin ([place]) 05/14/2019   Type 2 diabetes mellitus with diabetic polyneuropathy, with long-term current use of insulin ([place])  05/14/2019   History of gout 05/20/2018   Primary localized osteoarthritis of right knee 05/13/2018   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 [place]) [place] [address] Phone: [phone] Fax: [place], [place] AT [address] [place] [address] Phone: [phone] Fax: [phone]     Social Determinants of Health ([place]) Interventions    Readmission Risk Interventions     No data to display

## 2021-12-22 NOTE — Progress Notes (Signed)
PT Cancellation Note  Patient Details Name: Danielle [name] MRN: [id number] DOB: [birth year]   Cancelled Treatment:    Reason Eval/Treat Not Completed: Patient declined, no reason specified. Pt lethargic in bed. Pt encouraged by family member and therapist to participate but unwilling. Nurse aware.   Donna Bernard, PT   Donna Bernard 12/22/2021, 2:40 PM

## 2021-12-23 LAB — CBC
HCT: 22.5 % — ABNORMAL LOW (ref 36.0–46.0)
Hemoglobin: 7 g/dL — ABNORMAL LOW (ref 12.0–15.0)
MCH: 21.1 pg — ABNORMAL LOW (ref 26.0–34.0)
MCHC: 31.1 g/dL (ref 30.0–36.0)
MCV: 68 fL — ABNORMAL LOW (ref 80.0–100.0)
Platelets: 169 10*3/uL (ref 150–400)
RBC: 3.31 MIL/uL — ABNORMAL LOW (ref 3.87–5.11)
RDW: 16.7 % — ABNORMAL HIGH (ref 11.5–15.5)
WBC: 8.9 10*3/uL (ref 4.0–10.5)
nRBC: 0 % (ref 0.0–0.2)

## 2021-12-23 LAB — BASIC METABOLIC PANEL
Anion gap: 5 (ref 5–15)
BUN: 22 mg/dL (ref 8–23)
CO2: 27 mmol/L (ref 22–32)
Calcium: 8.4 mg/dL — ABNORMAL LOW (ref 8.9–10.3)
Chloride: 108 mmol/L (ref 98–111)
Creatinine, Ser: 1.29 mg/dL — ABNORMAL HIGH (ref 0.44–1.00)
GFR, Estimated: 43 mL/min — ABNORMAL LOW (ref >60)
Glucose, Bld: 139 mg/dL — ABNORMAL HIGH (ref 70–99)
Potassium: 4 mmol/L (ref 3.5–5.1)
Sodium: 140 mmol/L (ref 135–145)

## 2021-12-23 LAB — GLUCOSE, CAPILLARY
Glucose-Capillary: 117 mg/dL — ABNORMAL HIGH (ref 70–99)
Glucose-Capillary: 131 mg/dL — ABNORMAL HIGH (ref 70–99)
Glucose-Capillary: 121 mg/dL — ABNORMAL HIGH (ref 70–99)
Glucose-Capillary: 85 mg/dL (ref 70–99)

## 2021-12-23 MED ORDER — ENSURE ENLIVE PO LIQD
237.0000 mL | Freq: Two times a day (BID) | ORAL | Status: DC
  Administered 2021-12-23 – 2022-01-03 (×19): 237 mL via ORAL

## 2021-12-23 NOTE — Plan of Care (Signed)

## 2021-12-23 NOTE — Progress Notes (Signed)
     Subjective: Patient doing okay.  Hgb 7.0 this AM. She declined PT yesterday. Reportedly drowsy. Encouraged her to get out of bed. Family at bedside is very supportive. Pain has been moderately well controlled  Objective:   VITALS:   Vitals:   12/23/21 0015 12/23/21 0428 12/23/21 0700 12/23/21 0719  BP: (!) 131/58 (!) 144/69  138/65  Pulse: 86 89  84  Resp: 20 16    Temp: 100 F (37.8 C) (!) 100.5 F (38.1 C) 99 F (37.2 C) 99.1 F (37.3 C)  TempSrc: Oral  Oral Oral  SpO2: 96% 99%  96%  Weight:      Height:        Sensation intact distally Intact pulses distally Dorsiflexion/Plantar flexion intact Incision: Incisional Prevena wound VAC holding suction no output in canister or tubing. Compartment soft   Lab Results  Component Value Date   WBC 8.9 12/23/2021   HGB 7.0 (L) 12/23/2021   HCT 22.5 (L) 12/23/2021   MCV 68.0 (L) 12/23/2021   PLT 169 12/23/2021   BMET    Component Value Date/Time   NA 140 12/23/2021 0705   K 4.0 12/23/2021 0705   CL 108 12/23/2021 0705   CO2 27 12/23/2021 0705   GLUCOSE 139 (H) 12/23/2021 0705   BUN 22 12/23/2021 0705   CREATININE 1.29 (H) 12/23/2021 0705   CREATININE 1.04 (H) 09/21/2021 1211   CALCIUM 8.4 (L) 12/23/2021 0705   EGFR 56 (L) 09/21/2021 1211   GFRNONAA 43 (L) 12/23/2021 0705    Xray: Postop x-rays demonstrate revision total knee arthroplasty components in excellent position without adverse features  Assessment/Plan: 3 Days Post-Op   Principal Problem:   Prosthetic joint implant failure, initial encounter ([place])  Second stage right total knee revision replant for MSSA prosthetic joint infection 12/20/2021 Anemia stable: down to 7.0 today. Will hold eliquis today and recheck tomorrow. Touch base with medicine vs hematology regarding further intervention. She is also on iron and folic acid.  Post op recs: WB: 50% partial weightbearing with a walker for 6 weeks. Abx: ancef in house.  Plan to discharge with  cefadroxil for a total of 3 months from the surgery. Imaging: PACU xrays DVT prophylaxis: Eliquis 2.5 mg twice daily starting postop day 1.  Plan to resume Eliquis 5 mg twice daily on postop day 3 if her hemoglobin is stable. Follow up: 1 week after surgery for a wound check with Dr. Zachery Dakins at [facility].  Address: [address] [place], [place], [place] [address]  Office Phone: [phone]   Charlies Constable, MD Orthopaedic Surgery      Neria Procter A [place] 12/23/2021, 10:58 AM   Charlies Constable, MD  Contact information:   [phone] 7am-5pm epic message Dr. Zachery Dakins, or call office for patient follow up: [phone] After hours and holidays please check [email or web address] for group call information for [facility]

## 2021-12-24 LAB — GLUCOSE, CAPILLARY
Glucose-Capillary: 117 mg/dL — ABNORMAL HIGH (ref 70–99)
Glucose-Capillary: 100 mg/dL — ABNORMAL HIGH (ref 70–99)
Glucose-Capillary: 92 mg/dL (ref 70–99)
Glucose-Capillary: 83 mg/dL (ref 70–99)

## 2021-12-24 LAB — CBC
HCT: 23.6 % — ABNORMAL LOW (ref 36.0–46.0)
Hemoglobin: 7.4 g/dL — ABNORMAL LOW (ref 12.0–15.0)
MCH: 21.1 pg — ABNORMAL LOW (ref 26.0–34.0)
MCHC: 31.4 g/dL (ref 30.0–36.0)
MCV: 67.4 fL — ABNORMAL LOW (ref 80.0–100.0)
Platelets: 178 10*3/uL (ref 150–400)
RBC: 3.5 MIL/uL — ABNORMAL LOW (ref 3.87–5.11)
RDW: 16.6 % — ABNORMAL HIGH (ref 11.5–15.5)
WBC: 9.8 10*3/uL (ref 4.0–10.5)
nRBC: 0 % (ref 0.0–0.2)

## 2021-12-24 MED ORDER — APIXABAN 2.5 MG PO TABS
2.5000 mg | ORAL_TABLET | Freq: Two times a day (BID) | ORAL | Status: AC
  Administered 2021-12-24 – 2021-12-25 (×4): 2.5 mg via ORAL
  Filled 2021-12-24 (×4): qty 1

## 2021-12-24 MED ORDER — GLUCERNA SHAKE PO LIQD
237.0000 mL | ORAL | Status: DC
  Administered 2021-12-24 – 2022-01-01 (×9): 237 mL via ORAL

## 2021-12-24 NOTE — Progress Notes (Signed)
Initial Nutrition Assessment RD working remotely.  DOCUMENTATION CODES:   Obesity unspecified  INTERVENTION:  - continue Ensure Plus High Protein BID, each supplement provides 350 kcal and 20 grams of protein.  - will order Glucerna Shake once/day, each supplement provides 220 kcal and 10 grams of protein.  - complete NFPE when feasible.  - added Carbohydrate Counting for People with Diabetes handout to AVS.   NUTRITION DIAGNOSIS:   Increased nutrient needs related to post-op healing as evidenced by estimated needs.  GOAL:   Patient will meet greater than or equal to 90% of their needs  MONITOR:   PO intake, Supplement acceptance, Labs, Weight trends  REASON FOR ASSESSMENT:   Consult Assessment of nutrition requirement/status, Poor PO, Wound healing  ASSESSMENT:   76 year-old female with medical history of HTN, CHF, hypothyroidism, CAD, OSA on CPAP, type 2 DM, arthritis, L breast cancer s/p chemo and XRT. She presented to the hospital for planned revision of knee arthroplasty.  The only documented meal intake was 100% of breakfast on 7/27.   She is POD #4 removal of R knee antibiotic spacer and revision o R knee total arthroplasty.  She has not been seen by a  RD at any time in the past.  Ensure ordered BID yesterday and she has accepted all bottles offered so far.   Weight on 7/26 was 204 lb and weight has been mainly stable with slight fluctuations over the past 2.5 months. Mild pitting edema to RUE, moderate pitting edema to LUE, and deep pitting edema to BLE documented in the edema section of flow sheet.     Labs reviewed; CBGs: 117 and 100 mg/dl, creatinine: 1.29 mg/dl, Ca: 8.4 mg/dl, GFR: 43 ml/min.  Medications reviewed; 100 mg colace BID, 10 mg jardiance/day, 325 mg ferrous sulfate BID, 1 mg folvite/day, sliding scale novolog, 6 units novolog TID, 25 units semglee/day, 75 mcg oral synthroid/day, 1 tablet multivitamin with minerals/day, 40 mg oral  protonix/day, 12.5 mg aldactone/day.  IVF; LR @ 75 ml/hr.    NUTRITION - FOCUSED PHYSICAL EXAM:  RD working remotely.  Diet Order:   Diet Order             Diet Carb Modified Fluid consistency: Thin; Room service appropriate? Yes  Diet effective now                   EDUCATION NEEDS:   Education needs have been addressed  Skin:  Skin Assessment: Skin Integrity Issues: Skin Integrity Issues:: Incisions Incisions: R knee (7/26)  Last BM:  PTA/unknown  Height:   Ht Readings from Last 1 Encounters:  12/20/21 '5\' 4"'$  (1.626 m)    Weight:   Wt Readings from Last 1 Encounters:  12/20/21 92.5 kg     BMI:  Body mass index is 35.02 kg/m.  Estimated Nutritional Needs:  Kcal:  1700-1900 kcal Protein:  85-95 grams Fluid:  >/= 2 L/day      Jarome Matin, MS, RD, LDN, CNSC Registered Dietitian II Inpatient Clinical Nutrition RD pager # and on-call/weekend pager # available in [facility]

## 2021-12-24 NOTE — Progress Notes (Addendum)
    Subjective: Patient reports pain as moderate.  Tolerating diet.  Urinating.   No CP, SOB.  Has mobilized very little OOB with PT.   Objective:   VITALS:   Vitals:   12/23/21 0719 12/23/21 1518 12/23/21 2011 12/24/21 0441  BP: 138/65 140/62 114/62 135/61  Pulse: 84 89 84 86  Resp:   15 15  Temp: 99.1 F (37.3 C) 99.9 F (37.7 C) 98.2 F (36.8 C) 98.7 F (37.1 C)  TempSrc: Oral Oral    SpO2: 96% 97% 97% 94%  Weight:      Height:          Latest Ref Rng & Units 12/24/2021    1:57 AM 12/23/2021    7:05 AM 12/22/2021    4:30 AM  CBC  WBC 4.0 - 10.5 K/uL 9.8  8.9  11.8   Hemoglobin 12.0 - 15.0 g/dL 7.4  7.0  8.3   Hematocrit 36.0 - 46.0 % 23.6  22.5  26.9   Platelets 150 - 400 K/uL 178  169  166       Latest Ref Rng & Units 12/23/2021    7:05 AM 12/21/2021    3:55 AM 12/11/2021   11:47 AM  BMP  Glucose 70 - 99 mg/dL 139  175  160   BUN 8 - 23 mg/dL '22  21  22   '$ Creatinine 0.44 - 1.00 mg/dL 1.29  1.19  1.17   Sodium 135 - 145 mmol/L 140  137  140   Potassium 3.5 - 5.1 mmol/L 4.0  5.0  4.5   Chloride 98 - 111 mmol/L 108  103  104   CO2 22 - 32 mmol/L '27  25  25   '$ Calcium 8.9 - 10.3 mg/dL 8.4  8.9  9.8    Intake/Output      07/29 0701 07/30 0700 07/30 0701 07/31 0700   P.O.     IV Piggyback     Total Intake(mL/kg)     Urine (mL/kg/hr)     Total Output     Net             Physical Exam: General: NAD.  Sleeping in bed, easily awoken, calm Resp: No increased wob Cardio: regular rate and rhythm ABD soft Neurologically intact MSK Neurovascularly intact Sensation intact distally Intact pulses distally Dorsiflexion/Plantar flexion intact Incision: dressing C/D/I Nothing in wound vac tubing or canister  Assessment: 4 Days Post-Op  S/P Procedure(s) (LRB): REMOVAL OF RIGHT KNEE ANTIBIOTIC SPACER AND REVISION RIGHT KNEE TOTAL ARTHROPLASTY (Right) by Dr. Zachery Dakins on 12/20/21  Principal Problem:   Prosthetic joint implant failure, initial encounter  ([place])    Plan:  Advance diet Up with therapy Incentive Spirometry Elevate and Apply ice  Weightbearing: PWB 50%  RLE Insicional and dressing care: Dressings left intact until follow-up and Reinforce dressings as needed Orthopedic device(s): Wound OMV:EHMCN on Showering: Keep dressing dry VTE prophylaxis:  Eliquis has been on hold. Will restart 2.'5mg'$  bid today and tomorrow since Hgb 7.4 today. If Hgb continue to stay >7.0 then can increase to '5mg'$  on Tuesday  , SCDs, ambulation Pain control: continue current regimen Follow - up plan: 1 week with Dr. Louann Liv information:  Ophelia Charter MD, Aggie Moats PA-C  Dispo:  TBD  based on PT/OT evaluations and patient progress. Continue to monitor Hgb.    Britt Bottom, PA-C Office [phone] 12/24/2021, 7:46 AM

## 2021-12-25 ENCOUNTER — Encounter (HOSPITAL_COMMUNITY): Payer: Self-pay | Admitting: Orthopedic Surgery

## 2021-12-25 LAB — GLUCOSE, CAPILLARY
Glucose-Capillary: 129 mg/dL — ABNORMAL HIGH (ref 70–99)
Glucose-Capillary: 135 mg/dL — ABNORMAL HIGH (ref 70–99)
Glucose-Capillary: 136 mg/dL — ABNORMAL HIGH (ref 70–99)
Glucose-Capillary: 123 mg/dL — ABNORMAL HIGH (ref 70–99)

## 2021-12-25 MED FILL — Sodium Chloride IV Soln 0.9%: INTRAVENOUS | Qty: 2000 | Status: AC

## 2021-12-25 MED FILL — Heparin Sodium (Porcine) Inj 1000 Unit/ML: INTRAMUSCULAR | Qty: 30 | Status: AC

## 2021-12-25 NOTE — TOC Progression Note (Signed)
Transition of Care [facility]) - Initial/Assessment Note    Patient Details  Name: Danielle [name] MRN: [id number] Date of Birth: 12-31-1945  [facility]) CM/SW Contact:    [name], [place] Phone Number: 12/25/2021, 10:30 AM  Clinical Narrative:                 CSW spoke with [name] at [facility].  The facility has a bed and is starting insurance authorization today.   Pending: insurance auth  TOC will continue to follow.   Expected Discharge Plan: [place] Barriers to Discharge: Continued Medical Work up, SNF Pending bed offer   Patient Goals and CMS Choice Patient states their goals for this hospitalization and ongoing recovery are:: walk, run   Choice offered to / list presented to : Patient, Adult Children ([name])  Expected Discharge Plan and Services Expected Discharge Plan: [place] In-house Referral: Clinical Social Work   Post Acute Care Choice: [place] Living arrangements for the past 2 months: [place]                                      Prior Living Arrangements/Services Living arrangements for the past 2 months: Single Family Home Lives with:: Self Patient language and need for interpreter reviewed:: Yes Do you feel safe going back to the place where you live?: Yes      Need for Family Participation in Patient Care: Yes (Comment) Care giver support system in place?: Yes (comment) Current home services: Other (comment) (none) Criminal Activity/Legal Involvement Pertinent to Current Situation/Hospitalization: No - Comment as needed  Activities of Daily Living      Permission Sought/Granted Permission sought to share information with : Family Supports Permission granted to share information with : Yes, Verbal Permission Granted  Share Information with NAME: all daughters  Permission granted to share info w AGENCY: [facility]        Emotional  Assessment Appearance:: Appears stated age Attitude/Demeanor/Rapport: Engaged Affect (typically observed): Appropriate, Pleasant Orientation: : Oriented to Self, Oriented to Place, Oriented to  Time, Oriented to Situation Alcohol / Substance Use: Not Applicable Psych Involvement: No (comment)  Admission diagnosis:  Prosthetic joint implant failure, initial encounter [facility]) [T84.019A] Patient Active Problem List   Diagnosis Date Noted   Prosthetic joint implant failure, initial encounter ([place]) 12/20/2021   Pseudophakia of both eyes 11/15/2021   DVT (deep venous thrombosis) ([place]) 07/31/2021   Acute blood loss anemia    Septic Arthritis, Infection of prosthetic right knee joint ([place]), MSSA infection 35/59/7416   Complication of internal right knee prosthesis ([place]) 07/10/2021   CAD (coronary artery disease) 06/08/2021   Carotid artery disease ([place]) 06/08/2021   Symptomatic bradycardia 06/08/2021   Paroxysmal atrial fibrillation ([place]) 06/08/2021   CKD (chronic kidney disease), stage III ([place]) 06/08/2021   Polyneuropathy associated with underlying disease ([place]) 05/04/2020   Severe nonproliferative diabetic retinopathy of left eye, with macular edema, associated with type 2 diabetes mellitus ([place]) 11/05/2019   Severe nonproliferative diabetic retinopathy of right eye, with macular edema, associated with type 2 diabetes mellitus ([place]) 11/05/2019   Retinal hemorrhage of right eye 11/05/2019   Retinal hemorrhage of left eye 11/05/2019   Diabetes mellitus ([place]) 05/14/2019   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  ([place]) [place] [address] Phone: [phone] Fax: [place], [place] AT [address] [place] [address] Phone: [phone] Fax: [phone]     Social Determinants of Health (SDOH) Interventions    Readmission Risk Interventions     No data to display

## 2021-12-25 NOTE — Progress Notes (Addendum)
Physical Therapy Treatment Patient Details Name: Danielle [name] MRN: [id number] DOB: 29-Oct-1945 Today's Date: 12/25/2021   History of Present Illness 76 y.o. female who presents 12/20/21 for FAILED TOTAL KNEE RIGHT. The patient has a history of pain and functional disability in the right knee due to infected TKA prosthesis. Joint prothesis explant with placement of antibiotic spacer in 06/2021; 7/26 Rt TKA revision   PMH: chronic combined systolic and diastolic CHF, CKD stage III, IDDM, HTN, bilateral knee replacement, breast cancer s/p chemo and lumpectomy, OSA on CPAP, hypothyroidism.    PT Comments    Pt required encouragement to participate. Pt instructed in and performed therapeutic activities. Pt with slow processing and required increased time to complete tasks. Pt unable to perform step pivot transfer to chair with RW and thus Stedy utilized. Pt will continue to benefit from skilled, acute care physical therapy interventions to maximize her current level of function and progress towards established goals.   Recommendations for follow up therapy are one component of a multi-disciplinary discharge planning process, led by the attending physician.  Recommendations may be updated based on patient status, additional functional criteria and insurance authorization.  Follow Up Recommendations  Skilled nursing-short term rehab (<3 hours/day) Can patient physically be transported by private vehicle: No   Assistance Recommended at Discharge Frequent or constant Supervision/Assistance  Patient can return home with the following Two people to help with walking and/or transfers;Assistance with cooking/housework;Direct supervision/assist for medications management;Direct supervision/assist for financial management;Assist for transportation;Help with stairs or ramp for entrance   Equipment Recommendations  None recommended by PT    Recommendations for Other Services       Precautions / Restrictions  Precautions Precautions: Fall;Knee;Other (comment) (wound vac) Restrictions Weight Bearing Restrictions: Yes RLE Weight Bearing: Partial weight bearing RLE Partial Weight Bearing Percentage or Pounds: 50%     Mobility  Bed Mobility Overal bed mobility: Needs Assistance Bed Mobility: Supine to Sit     Supine to sit: HOB elevated, Min assist     General bed mobility comments: Pt required more than reasonable time to complete supine to sit transfer. However, pt only needed physical assistance to scoot more EOB with sling pad. Pt utilized bed rail as well.    Transfers Overall transfer level: Needs assistance Equipment used: Rolling walker (2 wheels) Transfers: Sit to/from Stand, Bed to chair/wheelchair/BSC Sit to Stand: Mod assist, From elevated surface           General transfer comment: Pt performed three sit <> stands during this encounter (one from bed and two from [place]). Pt required increased cues and time to complete tasks. Attempted step pivot transfer from bed to chair but pt with R trunk lean, unsteadiness, and freezing of gait. Pt only able to take one small step forwards and would not sequence appropriately upon command. Pt returned to bed thereafter to complete transfer to chair with Stedy. Pt required increased cues to maintain WB precautions throughout. Transfer via Lift Equipment: Stedy  Ambulation/Gait               General Gait Details: Scientific laboratory technician Rankin (Stroke Patients Only)       Balance Overall balance assessment: Needs assistance         Standing balance support: Reliant on assistive device for balance, Bilateral upper extremity supported Standing balance-Leahy Scale: Poor  Cognition Arousal/Alertness: Awake/alert Behavior During Therapy: Anxious Overall Cognitive Status: Impaired/Different from baseline Area of Impairment: Attention,  Following commands, Safety/judgement, Problem solving, Orientation                 Orientation Level: Time Current Attention Level: Focused   Following Commands: Follows one step commands inconsistently, Follows one step commands with increased time Safety/Judgement: Decreased awareness of safety, Decreased awareness of deficits   Problem Solving: Slow processing, Decreased initiation, Difficulty sequencing, Requires verbal cues, Requires tactile cues General Comments: Pt with slow proccessing and intermittent confusion. Appeared emotional at times.        Exercises      General Comments General comments (skin integrity, edema, etc.): 100% SpO2 and 96 HR on RA      Pertinent Vitals/Pain Pain Assessment Pain Assessment: 0-10 Pain Score: 8  Pain Location: rt knee Pain Descriptors / Indicators: Constant, Operative site guarding, Crying Pain Intervention(s): Limited activity within patient's tolerance, Monitored during session    Home Living                          Prior Function            PT Goals (current goals can now be found in the care plan section) Acute Rehab PT Goals Patient Stated Goal: to go to SNF PT Goal Formulation: With patient Time For Goal Achievement: 01/04/22 Potential to Achieve Goals: Good Progress towards PT goals: Progressing toward goals (slow)    Frequency    Min 5X/week      PT Plan Current plan remains appropriate    Co-evaluation              AM-PAC PT "6 Clicks" Mobility   Outcome Measure  Help needed turning from your back to your side while in a flat bed without using bedrails?: A Little Help needed moving from lying on your back to sitting on the side of a flat bed without using bedrails?: A Little Help needed moving to and from a bed to a chair (including a wheelchair)?: Total Help needed standing up from a chair using your arms (e.g., wheelchair or bedside chair)?: A Lot Help needed to walk in  hospital room?: Total Help needed climbing 3-5 steps with a railing? : Total 6 Click Score: 11    End of Session Equipment Utilized During Treatment: Gait belt Activity Tolerance: Patient limited by pain;Other (comment) (decreased cognition) Patient left: with call bell/phone within reach;with nursing/sitter in room;in chair;with chair alarm set Nurse Communication: Mobility status;Need for lift equipment PT Visit Diagnosis: Unsteadiness on feet (R26.81);Difficulty in walking, not elsewhere classified (R26.2);Muscle weakness (generalized) (M62.81);Pain Pain - Right/Left: Right Pain - part of body: Knee     Time: 7939-0300 PT Time Calculation (min) (ACUTE ONLY): 40 min  Charges:  $Therapeutic Activity: 38-52 mins                     Donna Bernard, PT    [facility] 12/25/2021, 9:59 AM

## 2021-12-25 NOTE — Plan of Care (Signed)

## 2021-12-25 NOTE — Progress Notes (Signed)
     Subjective: Patient doing okay.  Was out of bed this morning in recliner. Just got back to bed. Denies distal n/t. Limited by fatigue. Also reports poor appetite but at least drinking ensure shakes. New CBC not drawn yet this morning. Her nurse is aware.  Objective:   VITALS:   Vitals:   12/24/21 1456 12/24/21 1938 12/25/21 0508 12/25/21 0802  BP: (!) 120/54 (!) 152/69 139/71 (!) 142/73  Pulse: 80 86 86 87  Resp:  $Remo'16 18 18  'lsWVO$ Temp: 98.8 F (37.1 C) 99.9 F (37.7 C) 98.9 F (37.2 C) 98.3 F (36.8 C)  TempSrc: Oral Oral Oral   SpO2: 100% 97% 97% 97%  Weight:      Height:        Sensation intact distally Intact pulses distally Dorsiflexion/Plantar flexion intact Incision: Incisional Prevena wound VAC holding suction no output in canister or tubing. Compartment soft   Lab Results  Component Value Date   WBC 9.8 12/24/2021   HGB 7.4 (L) 12/24/2021   HCT 23.6 (L) 12/24/2021   MCV 67.4 (L) 12/24/2021   PLT 178 12/24/2021   BMET    Component Value Date/Time   NA 140 12/23/2021 0705   K 4.0 12/23/2021 0705   CL 108 12/23/2021 0705   CO2 27 12/23/2021 0705   GLUCOSE 139 (H) 12/23/2021 0705   BUN 22 12/23/2021 0705   CREATININE 1.29 (H) 12/23/2021 0705   CREATININE 1.04 (H) 09/21/2021 1211   CALCIUM 8.4 (L) 12/23/2021 0705   EGFR 56 (L) 09/21/2021 1211   GFRNONAA 43 (L) 12/23/2021 0705    Xray: Postop x-rays demonstrate revision total knee arthroplasty components in excellent position without adverse features  Assessment/Plan: 5 Days Post-Op   Principal Problem:   Prosthetic joint implant failure, initial encounter ([place])  Second stage right total knee revision replant for MSSA prosthetic joint infection 12/20/2021 Anemia stable: down to 7.0 today. Will hold eliquis today and recheck tomorrow. Touch base with medicine vs hematology regarding further intervention. She is also on iron and folic acid.  Post op recs: WB: 50% partial weightbearing with a walker  for 6 weeks. Abx: ancef in house.  Plan to discharge with cefadroxil for a total of 3 months from the surgery. Imaging: PACU xrays DVT prophylaxis: Eliquis 2.5 mg twice daily starting postop day 1.  Plan to resume Eliquis 5 mg twice daily on postop day 3 if her hemoglobin is stable. Follow up: 1 week after surgery for a wound check with Dr. Zachery Dakins at [facility].  Address: [address] [place], [place], [place] [address]  Office Phone: [phone]   Charlies Constable, MD Orthopaedic Surgery      Danielle [name] A [place] 12/25/2021, 12:38 PM   Charlies Constable, MD  Contact information:   [phone] 7am-5pm epic message Dr. Zachery Dakins, or call office for patient follow up: [phone] After hours and holidays please check [email or web address] for group call information for [facility]

## 2021-12-26 ENCOUNTER — Telehealth: Payer: Self-pay | Admitting: Hematology and Oncology

## 2021-12-26 ENCOUNTER — Other Ambulatory Visit (HOSPITAL_COMMUNITY): Payer: Medicare HMO

## 2021-12-26 LAB — CBC
HCT: 22.3 % — ABNORMAL LOW (ref 36.0–46.0)
Hemoglobin: 6.9 g/dL — CL (ref 12.0–15.0)
MCH: 21 pg — ABNORMAL LOW (ref 26.0–34.0)
MCHC: 30.9 g/dL (ref 30.0–36.0)
MCV: 68 fL — ABNORMAL LOW (ref 80.0–100.0)
Platelets: 214 10*3/uL (ref 150–400)
RBC: 3.28 MIL/uL — ABNORMAL LOW (ref 3.87–5.11)
RDW: 16.2 % — ABNORMAL HIGH (ref 11.5–15.5)
WBC: 8.2 10*3/uL (ref 4.0–10.5)
nRBC: 0.4 % — ABNORMAL HIGH (ref 0.0–0.2)

## 2021-12-26 LAB — GLUCOSE, CAPILLARY
Glucose-Capillary: 119 mg/dL — ABNORMAL HIGH (ref 70–99)
Glucose-Capillary: 173 mg/dL — ABNORMAL HIGH (ref 70–99)
Glucose-Capillary: 138 mg/dL — ABNORMAL HIGH (ref 70–99)
Glucose-Capillary: 89 mg/dL (ref 70–99)

## 2021-12-26 NOTE — Progress Notes (Addendum)
Physical Therapy Treatment Patient Details Name: Danielle [name] MRN: [id number] DOB: Jan 26, 1946 Today's Date: 12/26/2021   History of Present Illness 76 y.o. female who presents 12/20/21 for FAILED TOTAL KNEE RIGHT. The patient has a history of pain and functional disability in the right knee due to infected TKA prosthesis. Joint prothesis explant with placement of antibiotic spacer in 06/2021; 7/26 Rt TKA revision   PMH: chronic combined systolic and diastolic CHF, CKD stage III, IDDM, HTN, bilateral knee replacement, breast cancer s/p chemo and lumpectomy, OSA on CPAP, hypothyroidism.    PT Comments    Pt performed stand pivot transfer from bed to chair with RW and moderate assistance x 2. Pt distracted throughout session due to constantly wanting her back to be scratched. Pt with low motivation. Will progress as tolerated.   Recommendations for follow up therapy are one component of a multi-disciplinary discharge planning process, led by the attending physician.  Recommendations may be updated based on patient status, additional functional criteria and insurance authorization.  Follow Up Recommendations  Skilled nursing-short term rehab (<3 hours/day) Can patient physically be transported by private vehicle: No   Assistance Recommended at Discharge Frequent or constant Supervision/Assistance  Patient can return home with the following Two people to help with walking and/or transfers;Assistance with cooking/housework;Direct supervision/assist for medications management;Direct supervision/assist for financial management;Assist for transportation;Help with stairs or ramp for entrance   Equipment Recommendations  None recommended by PT    Recommendations for Other Services       Precautions / Restrictions Precautions Precautions: Fall;Knee;Other (comment) (wound vac) Restrictions Weight Bearing Restrictions: Yes RLE Weight Bearing: Partial weight bearing RLE Partial Weight Bearing  Percentage or Pounds: 50%     Mobility  Bed Mobility Overal bed mobility: Needs Assistance Bed Mobility: Supine to Sit     Supine to sit: HOB elevated, Supervision     General bed mobility comments: Pt required more than reasonable time to complete supine to sit transfer. Bed rail utilized minimally.    Transfers Overall transfer level: Needs assistance Equipment used: Rolling walker (2 wheels) Transfers: Sit to/from Stand, Bed to chair/wheelchair/BSC Sit to Stand: Mod assist, From elevated surface, +2 physical assistance Stand pivot transfers: Mod assist, +2 safety/equipment         General transfer comment: Pt performed stand pivot transfer from bed to chair. Pt with slow pace and decreased safety awareness. Pt required increased cues for sequencing and maintenance of WB status.    Ambulation/Gait               General Gait Details: Unable   Stairs             Wheelchair Mobility    Modified Rankin (Stroke Patients Only)       Balance Overall balance assessment: Needs assistance         Standing balance support: Reliant on assistive device for balance, Bilateral upper extremity supported Standing balance-Leahy Scale: Poor                              Cognition Arousal/Alertness: Awake/alert Behavior During Therapy: Anxious Overall Cognitive Status: Impaired/Different from baseline Area of Impairment: Attention, Following commands, Safety/judgement, Problem solving                   Current Attention Level: Focused   Following Commands: Follows one step commands inconsistently, Follows one step commands with increased time Safety/Judgement: Decreased awareness of safety, Decreased awareness of  deficits   Problem Solving: Slow processing, Decreased initiation, Difficulty sequencing, Requires verbal cues, Requires tactile cues General Comments: Pt with slow processing and not willing to follow commands a lot due to wanting  her back to be scratched even during transfers. Pt A and O x 4. Poor sustained attention.        Exercises      General Comments General comments (skin integrity, edema, etc.): HR and sats stable on RA      Pertinent Vitals/Pain Pain Assessment Pain Assessment: 0-10 Pain Score: 0-No pain (at rest) Pain Location: rt knee Pain Descriptors / Indicators: Constant, Operative site guarding, Crying Pain Intervention(s): Monitored during session, Limited activity within patient's tolerance    Home Living                          Prior Function            PT Goals (current goals can now be found in the care plan section) Acute Rehab PT Goals Patient Stated Goal: to go to SNF PT Goal Formulation: With patient Time For Goal Achievement: 01/04/22 Potential to Achieve Goals: Fair Progress towards PT goals: Progressing toward goals (slow)    Frequency    Min 3X/week      PT Plan Frequency needs to be updated    Co-evaluation              AM-PAC PT "6 Clicks" Mobility   Outcome Measure  Help needed turning from your back to your side while in a flat bed without using bedrails?: A Little Help needed moving from lying on your back to sitting on the side of a flat bed without using bedrails?: A Little Help needed moving to and from a bed to a chair (including a wheelchair)?: Total Help needed standing up from a chair using your arms (e.g., wheelchair or bedside chair)?: Total Help needed to walk in hospital room?: Total Help needed climbing 3-5 steps with a railing? : Total 6 Click Score: 10    End of Session Equipment Utilized During Treatment: Gait belt Activity Tolerance: Patient limited by pain;Other (comment) (decreased effort) Patient left: with call bell/phone within reach;in chair;with chair alarm set Nurse Communication: Mobility status;Need for lift equipment PT Visit Diagnosis: Unsteadiness on feet (R26.81);Difficulty in walking, not elsewhere  classified (R26.2);Muscle weakness (generalized) (M62.81);Pain Pain - Right/Left: Right Pain - part of body: Knee     Time: 9371-6967 PT Time Calculation (min) (ACUTE ONLY): 19 min  Charges:  $Therapeutic Activity: 8-22 mins                     Donna Bernard, PT    [facility] 12/26/2021, 12:04 PM

## 2021-12-26 NOTE — Progress Notes (Signed)
MD on call stated that he will speak with primary MD because patient is Jehovah Witness.Arthor Captain LPN

## 2021-12-26 NOTE — Progress Notes (Signed)
Hgb 6.9 this AM. Will continue to hold eliquis. Plan to recheck hgb tomorrow AM.

## 2021-12-26 NOTE — Plan of Care (Signed)
  Problem: Education: Goal: Ability to describe self-care measures that may prevent or decrease complications (Diabetes Survival Skills Education) will improve Outcome: Progressing   Problem: Coping: Goal: Ability to adjust to condition or change in health will improve Outcome: Progressing   Problem: Fluid Volume: Goal: Ability to maintain a balanced intake and output will improve Outcome: Progressing   Problem: Health Behavior/Discharge Planning: Goal: Ability to identify and utilize available resources and services will improve Outcome: Progressing Goal: Ability to manage health-related needs will improve Outcome: Progressing   Problem: Nutritional: Goal: Maintenance of adequate nutrition will improve Outcome: Progressing Goal: Progress toward achieving an optimal weight will improve Outcome: Progressing   Problem: Skin Integrity: Goal: Risk for impaired skin integrity will decrease Outcome: Progressing   Problem: Tissue Perfusion: Goal: Adequacy of tissue perfusion will improve Outcome: Progressing

## 2021-12-26 NOTE — Plan of Care (Signed)

## 2021-12-26 NOTE — Progress Notes (Signed)
MD office notified that HGB critical value of 6.9 awaiting MD response. Arthor Captain LPN

## 2021-12-26 NOTE — Progress Notes (Addendum)
     Subjective: Patient doing well reports. Pain well controlled. Encouraged her to continue to get out of bed daily and work with PT. Plan to dc to SNF. AM CBC pending.   Objective:   VITALS:   Vitals:   12/25/21 0508 12/25/21 0802 12/25/21 2238 12/26/21 0505  BP: 139/71 (!) 142/73 132/72 (!) 144/53  Pulse: 86 87 93 87  Resp: $Remo'18 18 18 16  'cSUJK$ Temp: 98.9 F (37.2 C) 98.3 F (36.8 C) (!) 100.5 F (38.1 C)   TempSrc: Oral  Oral   SpO2: 97% 97% 96% 95%  Weight:      Height:        Sensation intact distally Intact pulses distally Dorsiflexion/Plantar flexion intact Incision: Incisional Prevena wound VAC holding suction no output in canister or tubing. Compartment soft   Lab Results  Component Value Date   WBC 9.8 12/24/2021   HGB 7.4 (L) 12/24/2021   HCT 23.6 (L) 12/24/2021   MCV 67.4 (L) 12/24/2021   PLT 178 12/24/2021   BMET    Component Value Date/Time   NA 140 12/23/2021 0705   K 4.0 12/23/2021 0705   CL 108 12/23/2021 0705   CO2 27 12/23/2021 0705   GLUCOSE 139 (H) 12/23/2021 0705   BUN 22 12/23/2021 0705   CREATININE 1.29 (H) 12/23/2021 0705   CREATININE 1.04 (H) 09/21/2021 1211   CALCIUM 8.4 (L) 12/23/2021 0705   EGFR 56 (L) 09/21/2021 1211   GFRNONAA 43 (L) 12/23/2021 0705    Xray: Postop x-rays demonstrate revision total knee arthroplasty components in excellent position without adverse features  Assessment/Plan: 6 Days Post-Op   Principal Problem:   Prosthetic joint implant failure, initial encounter ([place])  Second stage right total knee revision replant for MSSA prosthetic joint infection 12/20/2021 Anemia post op- 6.9 on 8/1 continue iron and folic acid. Holding eliquis  Post op recs: WB: 50% partial weightbearing with a walker for 6 weeks. Abx: ancef in house.  Plan to discharge with cefadroxil for a total of 3 months from the surgery. Imaging: PACU xrays DVT prophylaxis: Eliquis 2.5 mg twice daily starting postop day 1.  Plan to resume  Eliquis 5 mg twice daily on postop day 3 if her hemoglobin is stable. Continue to hold eliquis on 8/1 given hgb of 6.9. Follow up: 1 week after surgery for a wound check with Dr. Zachery Dakins at [facility].  Address: [address] [place], [place], [place] [address]  Office Phone: [phone]   Charlies Constable, MD Orthopaedic Surgery      Dorette Hartel A Jamoni Hewes 12/26/2021, 6:38 AM   Charlies Constable, MD  Contact information:   [phone] 7am-5pm epic message Dr. Zachery Dakins, or call office for patient follow up: [phone] After hours and holidays please check [email or web address] for group call information for [facility]

## 2021-12-26 NOTE — Telephone Encounter (Signed)
Rescheduled appointment per providers template. Left message.  ? ?

## 2021-12-26 NOTE — TOC Progression Note (Signed)
Transition of Care [facility]) - Initial/Assessment Note    Patient Details  Name: Danielle [name] MRN: [id number] Date of Birth: [birth year]  [facility]) CM/SW Contact:    Danielle [name], [place] Phone Number: 12/26/2021, 2:43 PM  Clinical Narrative:                 CSW contacted Danielle [name] in admissions at [facility] and was informed that insurance Danielle [name] is still pending.  TOC will continue to follow.  Expected Discharge Plan: [place] Barriers to Discharge: Continued Medical Work up, SNF Pending bed offer   Patient Goals and CMS Choice Patient states their goals for this hospitalization and ongoing recovery are:: walk, run   Choice offered to / list presented to : Patient, Adult Children ([name])  Expected Discharge Plan and Services Expected Discharge Plan: [place] In-house Referral: Clinical Social Work   Post Acute Care Choice: [place] Living arrangements for the past 2 months: [place]                                      Prior Living Arrangements/Services Living arrangements for the past 2 months: Single Family Home Lives with:: Self Patient language and need for interpreter reviewed:: Yes Do you feel safe going back to the place where you live?: Yes      Need for Family Participation in Patient Care: Yes (Comment) Care giver support system in place?: Yes (comment) Current home services: Other (comment) (none) Criminal Activity/Legal Involvement Pertinent to Current Situation/Hospitalization: No - Comment as needed  Activities of Daily Living      Permission Sought/Granted Permission sought to share information with : Family Supports Permission granted to share information with : Yes, Verbal Permission Granted  Share Information with NAME: all daughters  Permission granted to share info w AGENCY: [facility]        Emotional Assessment Appearance:: Appears stated  age Attitude/Demeanor/Rapport: Engaged Affect (typically observed): Appropriate, Pleasant Orientation: : Oriented to Self, Oriented to Place, Oriented to  Time, Oriented to Situation Alcohol / Substance Use: Not Applicable Psych Involvement: No (comment)  Admission diagnosis:  Prosthetic joint implant failure, initial encounter [facility]) [T84.019A] Patient Active Problem List   Diagnosis Date Noted   Prosthetic joint implant failure, initial encounter ([place]) 12/20/2021   Pseudophakia of both eyes 11/15/2021   DVT (deep venous thrombosis) ([place]) 07/31/2021   Acute blood loss anemia    Septic Arthritis, Infection of prosthetic right knee joint ([place]), MSSA infection 98/33/8250   Complication of internal right knee prosthesis ([place]) 07/10/2021   CAD (coronary artery disease) 06/08/2021   Carotid artery disease ([place]) 06/08/2021   Symptomatic bradycardia 06/08/2021   Paroxysmal atrial fibrillation ([place]) 06/08/2021   CKD (chronic kidney disease), stage III ([place]) 06/08/2021   Polyneuropathy associated with underlying disease ([place]) 05/04/2020   Severe nonproliferative diabetic retinopathy of left eye, with macular edema, associated with type 2 diabetes mellitus ([place]) 11/05/2019   Severe nonproliferative diabetic retinopathy of right eye, with macular edema, associated with type 2 diabetes mellitus ([place]) 11/05/2019   Retinal hemorrhage of right eye 11/05/2019   Retinal hemorrhage of left eye 11/05/2019   Diabetes mellitus ([place]) 05/14/2019   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, with long-term current use of insulin ([place]) 05/14/2019   Type 2 diabetes mellitus with diabetic polyneuropathy, with long-term current use of insulin ([place]) 05/14/2019  History of gout 05/20/2018   Primary localized osteoarthritis of right knee 05/13/2018   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 [place] ([place]) [place] [address] Phone: [phone] Fax: [place], [place] - [place] AT [place] [address] Phone: [phone] Fax: [phone]     Social Determinants of Health (SDOH) Interventions    Readmission Risk Interventions     No data to display

## 2021-12-26 NOTE — Telephone Encounter (Signed)
Advanced Heart Failure Patient Advocate Encounter   Patient was approved to receive Eliquis from [facility]  Effective dates: 12/26/21 through 05/27/22  Document scanned to chart.   [name], CPhT

## 2021-12-27 LAB — CBC
HCT: 21.7 % — ABNORMAL LOW (ref 36.0–46.0)
Hemoglobin: 6.8 g/dL — CL (ref 12.0–15.0)
MCH: 21.2 pg — ABNORMAL LOW (ref 26.0–34.0)
MCHC: 31.3 g/dL (ref 30.0–36.0)
MCV: 67.6 fL — ABNORMAL LOW (ref 80.0–100.0)
Platelets: 226 10*3/uL (ref 150–400)
RBC: 3.21 MIL/uL — ABNORMAL LOW (ref 3.87–5.11)
RDW: 16 % — ABNORMAL HIGH (ref 11.5–15.5)
WBC: 6.3 10*3/uL (ref 4.0–10.5)
nRBC: 0 % (ref 0.0–0.2)

## 2021-12-27 LAB — GLUCOSE, CAPILLARY
Glucose-Capillary: 111 mg/dL — ABNORMAL HIGH (ref 70–99)
Glucose-Capillary: 159 mg/dL — ABNORMAL HIGH (ref 70–99)
Glucose-Capillary: 110 mg/dL — ABNORMAL HIGH (ref 70–99)
Glucose-Capillary: 109 mg/dL — ABNORMAL HIGH (ref 70–99)

## 2021-12-27 NOTE — Progress Notes (Signed)
0639 Lab just called with a critical hgb 6.8.  Will contact on call MD and notify.  0647 Left a message on voicemail of on call PA for Percell [facility] / [facility].  Very close to shift change.  Will have day shift to follow up.

## 2021-12-27 NOTE — Progress Notes (Signed)
     Subjective: Patient doing well. Getting out of bed daily. Improving functionally. Pain better controlled. Patient and family in good spirits. Hgb remains low 6.8. Spoke with her hematologist, Dr. Iruku, who was in agreement with trying EPO. Will order today.   Objective:   VITALS:   Vitals:   12/26/21 2030 12/27/21 0541 12/27/21 0746 12/27/21 1519  BP: 125/74 (!) 108/57 125/76 116/71  Pulse: 79 78 80 79  Resp: 16 16    Temp: 99.7 F (37.6 C)  98.9 F (37.2 C) 98.9 F (37.2 C)  TempSrc: Oral  Oral Oral  SpO2: 100% 100% 97% 100%  Weight:      Height:        Sensation intact distally Intact pulses distally Dorsiflexion/Plantar flexion intact Incision: Incisional Prevena wound VAC holding suction no output in canister or tubing. Compartment soft   Lab Results  Component Value Date   WBC 6.3 12/27/2021   HGB 6.8 (LL) 12/27/2021   HCT 21.7 (L) 12/27/2021   MCV 67.6 (L) 12/27/2021   PLT 226 12/27/2021   BMET    Component Value Date/Time   NA 140 12/23/2021 0705   K 4.0 12/23/2021 0705   CL 108 12/23/2021 0705   CO2 27 12/23/2021 0705   GLUCOSE 139 (H) 12/23/2021 0705   BUN 22 12/23/2021 0705   CREATININE 1.29 (H) 12/23/2021 0705   CREATININE 1.04 (H) 09/21/2021 1211   CALCIUM 8.4 (L) 12/23/2021 0705   EGFR 56 (L) 09/21/2021 1211   GFRNONAA 43 (L) 12/23/2021 0705    Xray: Postop x-rays demonstrate revision total knee arthroplasty components in excellent position without adverse features  Assessment/Plan: 7 Days Post-Op   Principal Problem:   Prosthetic joint implant failure, initial encounter (HCC)  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 continue iron and folic acid. Holding eliquis. Dr. Iruku in agreement with giving EPO.  Post op recs: WB: 50% partial weightbearing with a walker for 6 weeks. Abx: ancef in house.  Plan to discharge with cefadroxil for a total of 3 months  from the surgery. Imaging: PACU xrays Dressing: Prevena wound vac: plan to remove and change before discharge from hospital. DVT prophylaxis: Eliquis 2.5 mg twice daily starting postop day 1.  Plan to resume Eliquis 5 mg twice daily on postop day 3 if her hemoglobin is stable. Continue to hold eliquis. Follow up: 1 week after surgery for a wound check with Dr. Marchwiany at [facility].  Address: [address], [place], [place] [address]  Office Phone: [phone]   Daniel Marchwiany, MD Orthopaedic Surgery      DANIEL A MARCHWIANY 12/27/2021, 4:14 PM   Daniel Marchwiany, MD  Contact information:   Weekdays 7am-5pm epic message Dr. Marchwiany, or call office for patient follow up: [phone] After hours and holidays please check [email or web address] for group call information for [facility]    

## 2021-12-28 LAB — GLUCOSE, CAPILLARY
Glucose-Capillary: 140 mg/dL — ABNORMAL HIGH (ref 70–99)
Glucose-Capillary: 184 mg/dL — ABNORMAL HIGH (ref 70–99)
Glucose-Capillary: 107 mg/dL — ABNORMAL HIGH (ref 70–99)
Glucose-Capillary: 98 mg/dL (ref 70–99)

## 2021-12-28 MED ORDER — DARBEPOETIN ALFA 150 MCG/0.3ML IJ SOSY
150.0000 ug | PREFILLED_SYRINGE | Freq: Once | INTRAMUSCULAR | Status: AC
  Administered 2021-12-28: 150 ug via SUBCUTANEOUS
  Filled 2021-12-28: qty 0.3

## 2021-12-28 NOTE — Progress Notes (Addendum)
Physical Therapy Treatment Patient Details Name: Danielle [name] MRN: [id number] DOB: [birth year] Today's Date: 12/28/2021   History of Present Illness 76 y.o. female who presents 12/20/21 for FAILED TOTAL KNEE RIGHT. The patient has a history of pain and functional disability in the right knee due to infected TKA prosthesis. Joint prothesis explant with placement of antibiotic spacer in 06/2021; 7/26 Rt TKA revision   PMH: chronic combined systolic and diastolic CHF, CKD stage III, IDDM, HTN, bilateral knee replacement, breast cancer s/p chemo and lumpectomy, OSA on CPAP, hypothyroidism.    PT Comments    Pt agreeable to participate but required encouragement. Pt able to progress to step pivot transfer from bed to chair with RW. Pt required increased cues and assistance to maintain balance with RW 2/2 mild to moderate retropulsion. Progress as tolerated with gait and chair follow.   Recommendations for follow up therapy are one component of a multi-disciplinary discharge planning process, led by the attending physician.  Recommendations may be updated based on patient status, additional functional criteria and insurance authorization.  Follow Up Recommendations  Skilled nursing-short term rehab (<3 hours/day) Can patient physically be transported by private vehicle: No   Assistance Recommended at Discharge Frequent or constant Supervision/Assistance  Patient can return home with the following Two people to help with walking and/or transfers;Assistance with cooking/housework;Direct supervision/assist for medications management;Direct supervision/assist for financial management;Assist for transportation;Help with stairs or ramp for entrance   Equipment Recommendations  None recommended by PT    Recommendations for Other Services       Precautions / Restrictions Precautions Precautions: Fall;Knee;Other (comment) (wound vac) Restrictions Weight Bearing Restrictions: Yes RLE Weight Bearing:  Partial weight bearing RLE Partial Weight Bearing Percentage or Pounds: 50%     Mobility  Bed Mobility Overal bed mobility: Needs Assistance Bed Mobility: Supine to Sit     Supine to sit: HOB elevated, Min assist     General bed mobility comments: HHA required for supine to sit.    Transfers Overall transfer level: Needs assistance Equipment used: Rolling walker (2 wheels) Transfers: Sit to/from Stand, Bed to chair/wheelchair/BSC Sit to Stand: From elevated surface, +2 physical assistance, Min assist   Step pivot transfers: Mod assist, +2 safety/equipment       General transfer comment: Pt performed step pivot transfer from bed to chair. Pt with slow pace and decreased safety awareness. Pt required cues to move RW more out in front of her but did not adhere to. Pt with retropulsion but no overt LOB. Cues provided for WB status.    Ambulation/Gait Ambulation/Gait assistance: Mod assist, +2 safety/equipment Gait Distance (Feet): 3 Feet Assistive device: Rolling walker (2 wheels) Gait Pattern/deviations: Decreased stance time - right, Decreased step length - left, Decreased step length - right, Step-to pattern       General Gait Details: Performed with step pivot transfer. Cues for sequencing, WB status, and technique.   Stairs             Wheelchair Mobility    Modified Rankin (Stroke Patients Only)       Balance Overall balance assessment: Needs assistance         Standing balance support: Reliant on assistive device for balance, Bilateral upper extremity supported Standing balance-Leahy Scale: Poor                              Cognition Arousal/Alertness: Awake/alert Behavior During Therapy: Anxious Overall Cognitive Status: Impaired/Different from  baseline Area of Impairment: Attention, Following commands, Safety/judgement, Problem solving                   Current Attention Level: Focused   Following Commands: Follows one  step commands inconsistently, Follows one step commands with increased time Safety/Judgement: Decreased awareness of safety, Decreased awareness of deficits   Problem Solving: Slow processing, Decreased initiation, Difficulty sequencing, Requires verbal cues, Requires tactile cues General Comments: Pt with slow processing and decreased command following.        Exercises Total Joint Exercises Quad Sets: Supine, Right (several reps) Heel Slides: Right, Supine (several reps) Straight Leg Raises: Right, Supine (single rep)    General Comments        Pertinent Vitals/Pain Pain Assessment Pain Assessment: 0-10 Pain Score:  (DNQ) Pain Location: rt knee Pain Descriptors / Indicators: Constant, Operative site guarding, Crying Pain Intervention(s): Limited activity within patient's tolerance, Monitored during session    Home Living                          Prior Function            PT Goals (current goals can now be found in the care plan section) Acute Rehab PT Goals Patient Stated Goal: to go to SNF PT Goal Formulation: With patient Time For Goal Achievement: 01/04/22 Potential to Achieve Goals: Fair Progress towards PT goals: Progressing toward goals (slow)    Frequency    Min 3X/week      PT Plan Frequency needs to be updated    Co-evaluation              AM-PAC PT "6 Clicks" Mobility   Outcome Measure  Help needed turning from your back to your side while in a flat bed without using bedrails?: A Little Help needed moving from lying on your back to sitting on the side of a flat bed without using bedrails?: A Little Help needed moving to and from a bed to a chair (including a wheelchair)?: Total Help needed standing up from a chair using your arms (e.g., wheelchair or bedside chair)?: Total Help needed to walk in hospital room?: Total Help needed climbing 3-5 steps with a railing? : Total 6 Click Score: 10    End of Session Equipment Utilized  During Treatment: Gait belt Activity Tolerance: Patient limited by pain Patient left: with call bell/phone within reach;in chair;with chair alarm set Nurse Communication: Mobility status;Need for lift equipment PT Visit Diagnosis: Unsteadiness on feet (R26.81);Difficulty in walking, not elsewhere classified (R26.2);Muscle weakness (generalized) (M62.81);Pain Pain - Right/Left: Right Pain - part of body: Knee     Time: 2440-1027 PT Time Calculation (min) (ACUTE ONLY): 22 min  Charges:  $Therapeutic Activity: 8-22 mins                    Donna Bernard, PT    [facility] 12/28/2021, 11:06 AM

## 2021-12-28 NOTE — Progress Notes (Signed)
Per Margreta Journey with [facility] has denied SNF. [facility] has started an appeal (ref # [id number]). SW faxed requested clinical information to [facility] for their review [phone]. Will f/u with updates as available.   Wandra Feinstein, MSW, LCSW [phone] (coverage)

## 2021-12-28 NOTE — Progress Notes (Signed)
     Subjective: Patient doing well. Getting out of bed daily. Improving functionally. Pain better controlled. Will discuss with pharmacy today regarding dosing of EPO. Recheck hgb tomorrow, hopefully discharge tomorrow to SNF  Objective:   VITALS:   Vitals:   12/27/21 1519 12/27/21 1947 12/28/21 0447 12/28/21 0728  BP: 116/71 137/84 124/66 (!) 153/71  Pulse: 79 86 78 84  Resp:  19 15   Temp: 98.9 F (37.2 C) 98.9 F (37.2 C)  99.1 F (37.3 C)  TempSrc: Oral Oral  Oral  SpO2: 100% 99% 100% 94%  Weight:      Height:        Sensation intact distally Intact pulses distally Dorsiflexion/Plantar flexion intact Incision: Incisional Prevena wound VAC holding suction no output in canister or tubing. Compartment soft   Lab Results  Component Value Date   WBC 6.3 12/27/2021   HGB 6.8 (LL) 12/27/2021   HCT 21.7 (L) 12/27/2021   MCV 67.6 (L) 12/27/2021   PLT 226 12/27/2021   BMET    Component Value Date/Time   NA 140 12/23/2021 0705   K 4.0 12/23/2021 0705   CL 108 12/23/2021 0705   CO2 27 12/23/2021 0705   GLUCOSE 139 (H) 12/23/2021 0705   BUN 22 12/23/2021 0705   CREATININE 1.29 (H) 12/23/2021 0705   CREATININE 1.04 (H) 09/21/2021 1211   CALCIUM 8.4 (L) 12/23/2021 0705   EGFR 56 (L) 09/21/2021 1211   GFRNONAA 43 (L) 12/23/2021 0705    Xray: Postop x-rays demonstrate revision total knee arthroplasty components in excellent position without adverse features  Assessment/Plan: 8 Days Post-Op   Principal Problem:   Prosthetic joint implant failure, initial encounter ([place])  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 continue iron and folic acid. Holding eliquis. Dr. Chryl Heck in agreement with giving EPO.  Post op recs: WB: 50% partial weightbearing with a walker for 6 weeks. Abx: ancef in house.  Plan to discharge with cefadroxil for a total of 3 months from the surgery. Imaging: PACU  xrays Dressing: Prevena wound vac: plan to remove and change before discharge from hospital. DVT prophylaxis: Eliquis 2.5 mg twice daily starting postop day 1.  Plan to resume Eliquis 5 mg twice daily on postop day 3 if her hemoglobin is stable. Continue to hold eliquis. Follow up: 1 week after surgery for a wound check with Dr. Zachery Dakins at [facility].  Address: [address] [place], [place], [place] [address]  Office Phone: [phone]   Charlies Constable, MD Orthopaedic [place] 12/28/2021, 8:07 AM   Charlies Constable, MD  Contact information:   [phone] 7am-5pm epic message Dr. Zachery Dakins, or call office for patient follow up: [phone] After hours and holidays please check [email or web address] for group call information for [facility]

## 2021-12-29 ENCOUNTER — Other Ambulatory Visit: Payer: Self-pay

## 2021-12-29 ENCOUNTER — Encounter (HOSPITAL_COMMUNITY): Payer: Self-pay | Admitting: Orthopedic Surgery

## 2021-12-29 LAB — CBC
HCT: 22.7 % — ABNORMAL LOW (ref 36.0–46.0)
Hemoglobin: 7 g/dL — ABNORMAL LOW (ref 12.0–15.0)
MCH: 21 pg — ABNORMAL LOW (ref 26.0–34.0)
MCHC: 30.8 g/dL (ref 30.0–36.0)
MCV: 68 fL — ABNORMAL LOW (ref 80.0–100.0)
Platelets: 261 10*3/uL (ref 150–400)
RBC: 3.34 MIL/uL — ABNORMAL LOW (ref 3.87–5.11)
RDW: 15.9 % — ABNORMAL HIGH (ref 11.5–15.5)
WBC: 8 10*3/uL (ref 4.0–10.5)
nRBC: 0.2 % (ref 0.0–0.2)

## 2021-12-29 LAB — GLUCOSE, CAPILLARY
Glucose-Capillary: 98 mg/dL (ref 70–99)
Glucose-Capillary: 87 mg/dL (ref 70–99)
Glucose-Capillary: 122 mg/dL — ABNORMAL HIGH (ref 70–99)
Glucose-Capillary: 103 mg/dL — ABNORMAL HIGH (ref 70–99)
Glucose-Capillary: 154 mg/dL — ABNORMAL HIGH (ref 70–99)

## 2021-12-29 MED ORDER — APIXABAN 5 MG PO TABS
5.0000 mg | ORAL_TABLET | Freq: Two times a day (BID) | ORAL | Status: DC
  Administered 2021-12-29 – 2022-01-03 (×10): 5 mg via ORAL
  Filled 2021-12-29 (×10): qty 1

## 2021-12-29 NOTE — Progress Notes (Signed)
Pt refuses SCDs on while in bed, stated that she does not see the purpose for them even though she has been told can prevent blood clots, education provided on need for use, especially while Eliquis was on hold. Pt verbalized understanding but still does not want them on, Eliquis to be restarted this evening w/ HS meds per provider note today. Will cont to monitor.

## 2021-12-29 NOTE — Progress Notes (Signed)
     Subjective: Patient doing okay. Slept well last night. States she has pain anytime someone touches her knee.   Objective:   VITALS:   Vitals:   12/28/21 0447 12/28/21 0728 12/28/21 1507 12/28/21 2244  BP: 124/66 (!) 153/71 (!) 116/58 131/71  Pulse: 78 84 71 81  Resp: 15   20  Temp:  99.1 F (37.3 C) 98.3 F (36.8 C) 98.8 F (37.1 C)  TempSrc:  Oral Oral Oral  SpO2: 100% 94% 98% 100%  Weight:      Height:        Sensation intact distally Intact pulses distally Dorsiflexion/Plantar flexion intact Incision: Wound vac removed. Incision CDI. Nylon sutures in place. New Aquacel dressing placed Compartment soft   Lab Results  Component Value Date   WBC 8.0 12/29/2021   HGB 7.0 (L) 12/29/2021   HCT 22.7 (L) 12/29/2021   MCV 68.0 (L) 12/29/2021   PLT 261 12/29/2021   BMET    Component Value Date/Time   NA 140 12/23/2021 0705   K 4.0 12/23/2021 0705   CL 108 12/23/2021 0705   CO2 27 12/23/2021 0705   GLUCOSE 139 (H) 12/23/2021 0705   BUN 22 12/23/2021 0705   CREATININE 1.29 (H) 12/23/2021 0705   CREATININE 1.04 (H) 09/21/2021 1211   CALCIUM 8.4 (L) 12/23/2021 0705   EGFR 56 (L) 09/21/2021 1211   GFRNONAA 43 (L) 12/23/2021 0705    Xray: Postop x-rays demonstrate revision total knee arthroplasty components in excellent position without adverse features  Assessment/Plan: 9 Days Post-Op   Principal Problem:   Prosthetic joint implant failure, initial encounter ([place])  Second stage right total knee revision replant for MSSA prosthetic joint infection 12/20/2021 Anemia post op- 7.0 on 8/4. continue iron and folic acid. Holding eliquis. Dr. Chryl Heck in agreement with giving EPO.  Post op recs: WB: 50% partial weightbearing with a walker for 6 weeks. Abx: ancef in house.  Plan to discharge with cefadroxil for a total of 3 months from the surgery. Imaging: PACU xrays Dressing: Aquacel dressing placed on 12/29/21 DVT prophylaxis: Eliquis 2.5 mg twice daily starting  postop day 1.  Plan to resume Eliquis 5 mg twice daily on postop day 3 if her hemoglobin is stable. Continue to hold eliquis. Follow up: 1 week after surgery for a wound check with Dr. Zachery Dakins at [facility].  Address: [address] [place], [place], [place] [address]  Office Phone: [phone]    Ethelda Chick 12/29/2021, 8:58 AM    Contact information:   Weekdays 7am-5pm epic message Dr. Zachery Dakins, or call office for patient follow up: [phone] After hours and holidays please check [email or web address] for group call information for [facility]

## 2021-12-29 NOTE — Progress Notes (Signed)
PT Cancellation Note  Patient Details Name: Danielle [name] MRN: [id number] DOB: 09/04/1945   Cancelled Treatment:    Reason Eval/Treat Not Completed: Other (comment) (Pt declining and stating "Why do you guys always come so late. I'm too tired." Pt not willing to participate in bed level exercises. Pt provided with HEP handout with sets/reps for specific exercises.)  Donna Bernard, PT   Donna Bernard 12/29/2021, 3:14 PM

## 2021-12-29 NOTE — Plan of Care (Signed)

## 2021-12-29 NOTE — Progress Notes (Signed)
Hgb has been stable for a number of days now, remains around 7.0. Will plan to resume eliquis today. Only recheck hgb if symptomatic. Okay to DC to SNF once has bed approval.

## 2021-12-29 NOTE — Progress Notes (Signed)
Per Christina with [facility], no determination from [place] at this time. SW left voicemail for [place] with [facility] [phone] requesting return call with update. TOC leadership aware of barrier to dc.   Wandra Feinstein, MSW, LCSW [phone] (coverage)

## 2021-12-29 NOTE — Plan of Care (Signed)
  Problem: Metabolic: Goal: Ability to maintain appropriate glucose levels will improve Outcome: Progressing   Problem: Education: Goal: Ability to describe self-care measures that may prevent or decrease complications (Diabetes Survival Skills Education) will improve Outcome: Not Progressing   Problem: Coping: Goal: Ability to adjust to condition or change in health will improve Outcome: Not Progressing

## 2021-12-30 LAB — GLUCOSE, CAPILLARY
Glucose-Capillary: 117 mg/dL — ABNORMAL HIGH (ref 70–99)
Glucose-Capillary: 103 mg/dL — ABNORMAL HIGH (ref 70–99)
Glucose-Capillary: 78 mg/dL (ref 70–99)
Glucose-Capillary: 95 mg/dL (ref 70–99)

## 2021-12-30 NOTE — Plan of Care (Signed)
  Problem: Coping: Goal: Ability to adjust to condition or change in health will improve Outcome: Not Progressing   Problem: Health Behavior/Discharge Planning: Goal: Ability to manage health-related needs will improve Outcome: Not Progressing   Problem: Nutritional: Goal: Progress toward achieving an optimal weight will improve Outcome: Not Progressing   Problem: Skin Integrity: Goal: Risk for impaired skin integrity will decrease Outcome: Not Progressing   Problem: Activity: Goal: Risk for activity intolerance will decrease Outcome: Not Progressing

## 2021-12-30 NOTE — Progress Notes (Signed)
Subjective: 10 Days Post-Op Procedure(s) (LRB): REMOVAL OF RIGHT KNEE ANTIBIOTIC SPACER AND REVISION RIGHT KNEE TOTAL ARTHROPLASTY (Right) Patient reports pain as mild.  Denies dizziness/light headedness.  Objective: Vital signs in last 24 hours: Temp:  [98.3 F (36.8 C)-99.5 F (37.5 C)] 98.3 F (36.8 C) (08/05 0910) Pulse Rate:  [72-89] 77 (08/05 0910) Resp:  [15-18] 18 (08/05 0910) BP: (122-155)/(58-75) 139/67 (08/05 0910) SpO2:  [90 %-99 %] 97 % (08/05 0910)  Intake/Output from previous day: 08/04 0701 - 08/05 0700 In: 474 [P.O.:474] Out: 1000 [Urine:1000] Intake/Output this shift: No intake/output data recorded.  Recent Labs    12/29/21 0211  HGB 7.0*   Recent Labs    12/29/21 0211  WBC 8.0  RBC 3.34*  HCT 22.7*  PLT 261   No results for input(s): "NA", "K", "CL", "CO2", "BUN", "CREATININE", "GLUCOSE", "CALCIUM" in the last 72 hours. No results for input(s): "LABPT", "INR" in the last 72 hours.  Sensation intact distally Dorsiflexion/Plantar flexion intact Incision: dressing C/D/I No cellulitis present   Assessment/Plan: 10 Days Post-Op Procedure(s) (LRB): REMOVAL OF RIGHT KNEE ANTIBIOTIC SPACER AND REVISION RIGHT KNEE TOTAL ARTHROPLASTY (Right) Post op anemia stable yesterday at 7.0  Awaiting bed placement SNF Recheck Hgb tomorrow 50% PWB RLE Up with therapy  Eliquis dvt proph         Chriss Czar 12/30/2021, 12:21 PM

## 2021-12-30 NOTE — TOC Progression Note (Signed)
Transition of Care [facility]) - Progression Note    Patient Details  Name: Danielle [name] MRN: [id number] Date of Birth: 10/24/1945  Transition of Care [facility] CM/SW Contact  Ina Homes, [place] Phone Number: 12/30/2021, 11:25 AM  Clinical Narrative:     SW left VM with [name] [facility] [phone]) to f/u on appeal decision.    Expected Discharge Plan: [place] Barriers to Discharge: Continued Medical Work up, SNF Pending bed offer  Expected Discharge Plan and Services Expected Discharge Plan: [place] In-house Referral: Clinical Social Work   Post Acute Care Choice: [place] Living arrangements for the past 2 months: Single Family Home                                       Social Determinants of Health (SDOH) Interventions    Readmission Risk Interventions     No data to display

## 2021-12-30 NOTE — Plan of Care (Signed)
  Problem: Coping: Goal: Ability to adjust to condition or change in health will improve Outcome: Progressing   Problem: Fluid Volume: Goal: Ability to maintain a balanced intake and output will improve Outcome: Progressing   Problem: Health Behavior/Discharge Planning: Goal: Ability to manage health-related needs will improve Outcome: Progressing   Problem: Nutritional: Goal: Maintenance of adequate nutrition will improve Outcome: Progressing   Problem: Skin Integrity: Goal: Risk for impaired skin integrity will decrease Outcome: Progressing

## 2021-12-31 LAB — HEMOGLOBIN AND HEMATOCRIT, BLOOD
HCT: 22.4 % — ABNORMAL LOW (ref 36.0–46.0)
Hemoglobin: 6.8 g/dL — CL (ref 12.0–15.0)

## 2021-12-31 LAB — GLUCOSE, CAPILLARY
Glucose-Capillary: 89 mg/dL (ref 70–99)
Glucose-Capillary: 165 mg/dL — ABNORMAL HIGH (ref 70–99)
Glucose-Capillary: 144 mg/dL — ABNORMAL HIGH (ref 70–99)
Glucose-Capillary: 79 mg/dL (ref 70–99)
Glucose-Capillary: 94 mg/dL (ref 70–99)

## 2021-12-31 MED ORDER — POLYETHYLENE GLYCOL 3350 17 G PO PACK
17.0000 g | PACK | Freq: Once | ORAL | Status: DC
Start: 2021-12-31 — End: 2022-01-04

## 2021-12-31 NOTE — Plan of Care (Signed)
  Problem: Education: Goal: Ability to describe self-care measures that may prevent or decrease complications (Diabetes Survival Skills Education) will improve Outcome: Not Progressing Goal: Individualized Educational Video(s) Outcome: Not Progressing   Problem: Coping: Goal: Ability to adjust to condition or change in health will improve Outcome: Not Progressing   Problem: Fluid Volume: Goal: Ability to maintain a balanced intake and output will improve Outcome: Not Progressing   Problem: Health Behavior/Discharge Planning: Goal: Ability to identify and utilize available resources and services will improve Outcome: Not Progressing Goal: Ability to manage health-related needs will improve Outcome: Not Progressing   Problem: Skin Integrity: Goal: Risk for impaired skin integrity will decrease Outcome: Not Progressing   Problem: Tissue Perfusion: Goal: Adequacy of tissue perfusion will improve Outcome: Not Progressing   Problem: Education: Goal: Knowledge of General Education information will improve Description: Including pain rating scale, medication(s)/side effects and non-pharmacologic comfort measures Outcome: Not Progressing   

## 2021-12-31 NOTE — Plan of Care (Signed)
  Problem: Health Behavior/Discharge Planning: Goal: Ability to identify and utilize available resources and services will improve Outcome: Progressing Goal: Ability to manage health-related needs will improve Outcome: Progressing   Problem: Metabolic: Goal: Ability to maintain appropriate glucose levels will improve Outcome: Progressing

## 2021-12-31 NOTE — Progress Notes (Signed)
Subjective: Patient reports pain as moderate. Mainly complaining of abdominal discomfort due to constipation. She has not had a BM yet. Little appetite.  Urinating.  No CP, SOB. IV in RUE not working so removed. She doesn't want any other attempts from the elbow to the hand as it is too painful now. Has not worked much with PT/OT on mobilization OOB yet. Has refused PT sessions. Refused to wear SCDs.   Objective:   VITALS:   Vitals:   12/30/21 0910 12/30/21 1519 12/30/21 2036 12/31/21 0506  BP: 139/67 98/86 (!) 143/79 137/61  Pulse: 77 72 73 78  Resp: '18 18 16 16  '$ Temp: 98.3 F (36.8 C) 98.6 F (37 C) 98 F (36.7 C) 98.5 F (36.9 C)  TempSrc: Oral Oral    SpO2: 97% 100% 94% 96%  Weight:      Height:          Latest Ref Rng & Units 12/31/2021    4:54 AM 12/29/2021    2:11 AM 12/27/2021    5:00 AM  CBC  WBC 4.0 - 10.5 K/uL  8.0  6.3   Hemoglobin 12.0 - 15.0 g/dL 6.8  7.0  6.8   Hematocrit 36.0 - 46.0 % 22.4  22.7  21.7   Platelets 150 - 400 K/uL  261  226       Latest Ref Rng & Units 12/23/2021    7:05 AM 12/21/2021    3:55 AM 12/11/2021   11:47 AM  BMP  Glucose 70 - 99 mg/dL 139  175  160   BUN 8 - 23 mg/dL '22  21  22   '$ Creatinine 0.44 - 1.00 mg/dL 1.29  1.19  1.17   Sodium 135 - 145 mmol/L 140  137  140   Potassium 3.5 - 5.1 mmol/L 4.0  5.0  4.5   Chloride 98 - 111 mmol/L 108  103  104   CO2 22 - 32 mmol/L '27  25  25   '$ Calcium 8.9 - 10.3 mg/dL 8.4  8.9  9.8    Intake/Output      08/05 0701 08/06 0700 08/06 0701 08/07 0700   P.O.     Total Intake(mL/kg)     Urine (mL/kg/hr)     Total Output     Net          Urine Occurrence 2 x       Physical Exam: General: NAD.  Sitting up in bedside chair, calm, comfortable Resp: No increased wob Cardio: regular rate and rhythm ABD soft Neurologically intact MSK Neurovascularly intact Sensation intact distally Intact pulses distally Dorsiflexion/Plantar flexion intact Incision: dressing C/D/I   Assessment: 11  Days Post-Op  S/P Procedure(s) (LRB): REMOVAL OF RIGHT KNEE ANTIBIOTIC SPACER AND REVISION RIGHT KNEE TOTAL ARTHROPLASTY (Right) by Dr. Zachery Dakins on 12/20/21  Principal Problem:   Prosthetic joint implant failure, initial encounter ([place])   Plan: Hgb 6.8 today.  Has been between 6.8-7.0 for several days. Has known anemia. Patient is a Sales promotion account executive Witness Will order Miralax to be given today as she said this has worked in the past Advance diet Up with therapy Incentive Spirometry Elevate and Apply ice  Weightbearing: PWB 50%  RLE Insicional and dressing care: Dressings left intact until follow-up and Reinforce dressings as needed Orthopedic device(s): None Showering: Keep dressing dry VTE prophylaxis:  Eliquis '5mg'$  bid  , SCDs, ambulation Pain control: continue current regimen Follow - up plan:  with Dr. Louann Liv information:  Christia Reading  Percell Miller MD, Aggie Moats PA-C  Dispo: Skilled Nursing Facility/Rehab once bed is available    Anticipated LOS equal to or greater than 2 midnights due to - Age 30 and older with one or more of the following:  - Obesity  - Expected need for hospital services (PT, OT, Nursing) required for safe  discharge  - Anticipated need for postoperative skilled nursing care or inpatient rehab  - Active co-morbidities: Diabetes, Coronary Artery Disease, Heart Failure, Heart Attack, and Anemia OR   - Unanticipated findings during/Post Surgery: Infected knee joint or operative site and Slow post-op progression: GI, pain control, mobility  - Patient is a high risk of re-admission due to: Non-elective hospital admission within previous 6 months and Barriers to post-acute care (logistical, no family support in home)   Alisa Graff Office [phone] 12/31/2021, 12:32 PM

## 2022-01-01 LAB — GLUCOSE, CAPILLARY
Glucose-Capillary: 105 mg/dL — ABNORMAL HIGH (ref 70–99)
Glucose-Capillary: 191 mg/dL — ABNORMAL HIGH (ref 70–99)
Glucose-Capillary: 129 mg/dL — ABNORMAL HIGH (ref 70–99)
Glucose-Capillary: 71 mg/dL (ref 70–99)

## 2022-01-01 NOTE — Plan of Care (Signed)

## 2022-01-01 NOTE — Progress Notes (Signed)
Subjective: Patient reports pain as mild. Much better today than yesterday. Was able to finally have a BM so feeling less abdominal discomfort. Urinating. No CP, SOB. New IV was placed in right arm. Had a better session with PT today than past sessions.   Objective:   VITALS:   Vitals:   12/31/21 0506 12/31/21 2124 01/01/22 0452 01/01/22 0821  BP: 137/61 (!) 142/75 106/71 124/71  Pulse: 78 72 78 70  Resp: '16 16 16 16  '$ Temp: 98.5 F (36.9 C) 99 F (37.2 C) 98.9 F (37.2 C) 97.8 F (36.6 C)  TempSrc:  Oral Oral Oral  SpO2: 96% 99% 98% 97%  Weight:      Height:          Latest Ref Rng & Units 12/31/2021    4:54 AM 12/29/2021    2:11 AM 12/27/2021    5:00 AM  CBC  WBC 4.0 - 10.5 K/uL  8.0  6.3   Hemoglobin 12.0 - 15.0 g/dL 6.8  7.0  6.8   Hematocrit 36.0 - 46.0 % 22.4  22.7  21.7   Platelets 150 - 400 K/uL  261  226       Latest Ref Rng & Units 12/23/2021    7:05 AM 12/21/2021    3:55 AM 12/11/2021   11:47 AM  BMP  Glucose 70 - 99 mg/dL 139  175  160   BUN 8 - 23 mg/dL '22  21  22   '$ Creatinine 0.44 - 1.00 mg/dL 1.29  1.19  1.17   Sodium 135 - 145 mmol/L 140  137  140   Potassium 3.5 - 5.1 mmol/L 4.0  5.0  4.5   Chloride 98 - 111 mmol/L 108  103  104   CO2 22 - 32 mmol/L '27  25  25   '$ Calcium 8.9 - 10.3 mg/dL 8.4  8.9  9.8    Intake/Output      08/06 0701 08/07 0700 08/07 0701 08/08 0700   P.O. 480    Total Intake(mL/kg) 480 (5.2)    Net +480            Physical Exam: General: NAD.  Sitting up in bedside chair, calm, comfortable Resp: No increased wob Cardio: regular rate and rhythm ABD soft Neurologically intact MSK Neurovascularly intact Sensation intact distally Intact pulses distally Dorsiflexion/Plantar flexion intact Incision: dressing C/D/I   Assessment: 12 Days Post-Op  S/P Procedure(s) (LRB): REMOVAL OF RIGHT KNEE ANTIBIOTIC SPACER AND REVISION RIGHT KNEE TOTAL ARTHROPLASTY (Right) by Dr. Zachery Dakins on 12/20/21  Principal Problem:    Prosthetic joint implant failure, initial encounter ([place])   Plan: Hgb has been between 6.8-7.0 for several days. Has known anemia. Patient is a Restaurant manager, fast food. Will order new CBC for tomorrow to trend again Advance diet Up with therapy Incentive Spirometry Elevate and Apply ice  Weightbearing: PWB 50%  RLE Insicional and dressing care: Dressings left intact until follow-up and Reinforce dressings as needed Orthopedic device(s): None Showering: Keep dressing dry VTE prophylaxis:  Eliquis '5mg'$  bid  , SCDs, ambulation Pain control: continue current regimen Follow - up plan:  with Dr. Louann Liv information: Dr. Zachery Dakins MD, Aggie Moats PA-C  Dispo: Skilled Nursing Facility/Rehab once bed is available. Will reach out to SW to find out update on this.    Anticipated LOS equal to or greater than 2 midnights due to - Age 43 and older with one or more of the following:  - Obesity  - Expected  need for hospital services (PT, OT, Nursing) required for safe  discharge  - Anticipated need for postoperative skilled nursing care or inpatient rehab  - Active co-morbidities: Diabetes, Coronary Artery Disease, Heart Failure, Heart Attack, and Anemia OR   - Unanticipated findings during/Post Surgery: Infected knee joint or operative site and Slow post-op progression: GI, pain control, mobility  - Patient is a high risk of re-admission due to: Non-elective hospital admission within previous 6 months and Barriers to post-acute care (logistical, no family support in home)   Britt Bottom, [place] Office [phone] 01/01/2022, 11:02 AM

## 2022-01-01 NOTE — Progress Notes (Signed)
Physical Therapy Treatment Patient Details Name: Danielle [name] MRN: [id number] DOB: 1945-08-14 Today's Date: 01/01/2022   History of Present Illness 76 y.o. female who presents 12/20/21 for FAILED TOTAL KNEE RIGHT. The patient has a history of pain and functional disability in the right knee due to infected TKA prosthesis. Joint prothesis explant with placement of antibiotic spacer in 06/2021; 7/26 Rt TKA revision   PMH: chronic combined systolic and diastolic CHF, CKD stage III, IDDM, HTN, bilateral knee replacement, breast cancer s/p chemo and lumpectomy, OSA on CPAP, hypothyroidism.    PT Comments    Pt instructed in and performed gait training and therapeutic exercises. Pt able to ambulate 8 feet within room but required max cues to maintain WB status. Pt with significant improvements today vs last several sessions. Progress as tolerated.   Recommendations for follow up therapy are one component of a multi-disciplinary discharge planning process, led by the attending physician.  Recommendations may be updated based on patient status, additional functional criteria and insurance authorization.  Follow Up Recommendations  Skilled nursing-short term rehab (<3 hours/day) Can patient physically be transported by private vehicle: No   Assistance Recommended at Discharge Frequent or constant Supervision/Assistance  Patient can return home with the following Two people to help with walking and/or transfers;Assistance with cooking/housework;Direct supervision/assist for medications management;Direct supervision/assist for financial management;Assist for transportation;Help with stairs or ramp for entrance   Equipment Recommendations  None recommended by PT    Recommendations for Other Services       Precautions / Restrictions Precautions Precautions: Fall;Knee Restrictions Weight Bearing Restrictions: Yes RLE Weight Bearing: Partial weight bearing RLE Partial Weight Bearing Percentage or  Pounds: 50%     Mobility  Bed Mobility               General bed mobility comments: Pt OOB in chair upon arrival.    Transfers Overall transfer level: Needs assistance Equipment used: Rolling walker (2 wheels) Transfers: Sit to/from Stand Sit to Stand: Min assist           General transfer comment: Cues for hand placements, WB status, technique, and safety awareness.    Ambulation/Gait Ambulation/Gait assistance: +2 safety/equipment, Min assist Gait Distance (Feet): 8 Feet Assistive device: Rolling walker (2 wheels) (chair follow) Gait Pattern/deviations: Decreased stance time - right, Decreased step length - left, Decreased step length - right, Step-to pattern, Antalgic Gait velocity: decreased Gait velocity interpretation: <1.31 ft/sec, indicative of household ambulator   General Gait Details: Pt required increased cues to maintain WB status. Pt also required cues to avoid leaning too far forwards on RW. Pt with slow pace. No LOB occurred.   Stairs             Wheelchair Mobility    Modified Rankin (Stroke Patients Only)       Balance Overall balance assessment: Needs assistance         Standing balance support: Reliant on assistive device for balance, Bilateral upper extremity supported Standing balance-Leahy Scale: Poor                              Cognition Arousal/Alertness: Awake/alert Behavior During Therapy: Anxious Overall Cognitive Status: Impaired/Different from baseline Area of Impairment: Attention, Following commands, Safety/judgement, Problem solving                   Current Attention Level: Focused   Following Commands: Follows one step commands inconsistently, Follows one step commands  with increased time Safety/Judgement: Decreased awareness of safety, Decreased awareness of deficits   Problem Solving: Slow processing, Decreased initiation, Difficulty sequencing, Requires verbal cues, Requires tactile  cues General Comments: Pt with slow processing and requires increased time to complete tasks.        Exercises General Exercises - Upper Extremity Elbow Flexion: Seated, 10 reps, Left, Right, Theraband (Pt requested UE exercises for transfer over to using her arms better during gait) Theraband Level (Elbow Flexion): Level 3 (Green) Other Exercises Other Exercises: 3 consecutive sit to stands performed from chair with B UE use from chair before grabbing RW; CGA utilized. R knee buckling x 1 rep. Cues for WB status    General Comments        Pertinent Vitals/Pain Pain Assessment Pain Assessment: 0-10 Pain Score: 0-No pain Pain Location: rt knee Pain Descriptors / Indicators: Constant, Operative site guarding, Crying Pain Intervention(s): Limited activity within patient's tolerance, Monitored during session    Home Living                          Prior Function            PT Goals (current goals can now be found in the care plan section) Acute Rehab PT Goals Patient Stated Goal: to go to SNF PT Goal Formulation: With patient Time For Goal Achievement: 01/04/22 Potential to Achieve Goals: Fair Progress towards PT goals: Progressing toward goals    Frequency    Min 3X/week      PT Plan Current plan remains appropriate.    Co-evaluation              AM-PAC PT "6 Clicks" Mobility   Outcome Measure  Help needed turning from your back to your side while in a flat bed without using bedrails?: A Little Help needed moving from lying on your back to sitting on the side of a flat bed without using bedrails?: A Little Help needed moving to and from a bed to a chair (including a wheelchair)?: A Little Help needed standing up from a chair using your arms (e.g., wheelchair or bedside chair)?: A Little Help needed to walk in hospital room?: A Lot Help needed climbing 3-5 steps with a railing? : Total 6 Click Score: 15    End of Session Equipment Utilized  During Treatment: Gait belt Activity Tolerance: Patient limited by pain Patient left: with call bell/phone within reach;in chair;with chair alarm set Nurse Communication: Mobility status;Need for lift equipment PT Visit Diagnosis: Unsteadiness on feet (R26.81);Difficulty in walking, not elsewhere classified (R26.2);Muscle weakness (generalized) (M62.81);Pain Pain - Right/Left: Right Pain - part of body: Knee     Time: 9373-4287 PT Time Calculation (min) (ACUTE ONLY): 25 min  Charges:  $Gait Training: 8-22 mins $Therapeutic Exercise: 8-22 mins                    Donna Bernard, PT    [facility] 01/01/2022, 11:56 AM

## 2022-01-02 LAB — COMPREHENSIVE METABOLIC PANEL
ALT: 7 U/L (ref 0–44)
AST: 28 U/L (ref 15–41)
Albumin: 3 g/dL — ABNORMAL LOW (ref 3.5–5.0)
Alkaline Phosphatase: 73 U/L (ref 38–126)
Anion gap: 13 (ref 5–15)
BUN: 28 mg/dL — ABNORMAL HIGH (ref 8–23)
CO2: 22 mmol/L (ref 22–32)
Calcium: 8.9 mg/dL (ref 8.9–10.3)
Chloride: 106 mmol/L (ref 98–111)
Creatinine, Ser: 1.4 mg/dL — ABNORMAL HIGH (ref 0.44–1.00)
GFR, Estimated: 39 mL/min — ABNORMAL LOW (ref >60)
Glucose, Bld: 121 mg/dL — ABNORMAL HIGH (ref 70–99)
Potassium: 5.4 mmol/L — ABNORMAL HIGH (ref 3.5–5.1)
Sodium: 141 mmol/L (ref 135–145)
Total Bilirubin: 0.3 mg/dL (ref 0.3–1.2)
Total Protein: 6.2 g/dL — ABNORMAL LOW (ref 6.5–8.1)

## 2022-01-02 LAB — GLUCOSE, CAPILLARY
Glucose-Capillary: 134 mg/dL — ABNORMAL HIGH (ref 70–99)
Glucose-Capillary: 131 mg/dL — ABNORMAL HIGH (ref 70–99)
Glucose-Capillary: 104 mg/dL — ABNORMAL HIGH (ref 70–99)
Glucose-Capillary: 102 mg/dL — ABNORMAL HIGH (ref 70–99)

## 2022-01-02 NOTE — Progress Notes (Signed)
Nutrition Follow-up  DOCUMENTATION CODES:   Obesity unspecified  INTERVENTION:  Encourage adequate PO intake Continue Ensure Enlive po BID, each supplement provides 350 kcal and 20 grams of protein. Discontinue Glucerna Shake once daily Order weekly weights  NUTRITION DIAGNOSIS:   Increased nutrient needs related to post-op healing as evidenced by estimated needs.  Ongoing  GOAL:   Patient will meet greater than or equal to 90% of their needs  Goal met via meals and nutrition supplements  MONITOR:   PO intake, Supplement acceptance, Labs, Weight trends  REASON FOR ASSESSMENT:   Consult Assessment of nutrition requirement/status, Poor PO, Wound healing  ASSESSMENT:   76 year-old female with medical history of HTN, CHF, hypothyroidism, CAD, OSA on CPAP, type 2 DM, arthritis, L breast cancer s/p chemo and XRT. She presented to the hospital for planned revision of knee arthroplasty.  Pending SNF placement.  Pt's appetite much improved. Meal completions of 100% x 8 recorded meals. She endorses drinking both Ensure and Glucerna. Given pt with good meal completions, will d/c Glucerna and continue to offer Ensure Plus BID for added protein and nutritional content between meals to optimize post-op healing.   No updated wt on file to review. Will request updated weekly weights.   Medications: colace, jardiance, ferrous sulfate, folvite, SSI 0-15 units TID, SSI 6 units TID, semglee 25 units qhs, synthroid, MVI, protonix, IV abx   Labs: potassium 5.4 (H), BUN  28, Cr 1.40, GFR 39, CBGs 71-191 x24 hours  Diet Order:   Diet Order             Diet Carb Modified Fluid consistency: Thin; Room service appropriate? Yes  Diet effective now                   EDUCATION NEEDS:   Education needs have been addressed  Skin:  Skin Assessment: Skin Integrity Issues: Skin Integrity Issues:: Incisions Incisions: R knee (7/26)  Last BM:  8/7  Height:   Ht Readings from Last 1  Encounters:  12/20/21 5' 4"  (1.626 m)    Weight:   Wt Readings from Last 1 Encounters:  12/20/21 92.5 kg    Ideal Body Weight:  54.54 kg  BMI:  Body mass index is 35.02 kg/m.  Estimated Nutritional Needs:   Kcal:  1700-1900 kcal  Protein:  85-95 grams  Fluid:  >/= 2 L/day  Danielle [name], RDN, LDN Clinical Nutrition

## 2022-01-02 NOTE — Progress Notes (Signed)
PT Cancellation Note  Patient Details Name: Danielle [name] MRN: [id number] DOB: 05/05/1946   Cancelled Treatment:    Reason Eval/Treat Not Completed: Patient declined, no reason specified;Other (comment) (pt states she is too exhausted)  Donna Bernard, PT   Donna Bernard 01/02/2022, 2:56 PM

## 2022-01-02 NOTE — TOC Progression Note (Addendum)
Transition of Care [facility]) - Progression Note    Patient Details  Name: Danielle [name] MRN: [id number] Date of Birth: February 18, 1946  Transition of Care [facility]) CM/SW Contact  Joanne Chars, LCSW Phone Number: 01/02/2022, 10:05 AM  Clinical Narrative:   CSW spoke with [name] at [facility].  Prior SNF auth request was denied due to [facility] not getting clinical information.  [name] was initially told to appeal but now has been asked to submit new auth, which she has done.  CSW sent latest PT note to her through the hub and she will submit as well.  She will reach out once she hears back from [place].    1130: CSW spoke with pt in room and provided update on the above.    Expected Discharge Plan: [place] Barriers to Discharge: Continued Medical Work up, SNF Pending bed offer  Expected Discharge Plan and Services Expected Discharge Plan: [place] In-house Referral: Clinical Social Work   Post Acute Care Choice: [place] Living arrangements for the past 2 months: Single Family Home                                       Social Determinants of Health (SDOH) Interventions    Readmission Risk Interventions     No data to display

## 2022-01-02 NOTE — Progress Notes (Signed)
Pt. Refused for a additional finger stick due to timing of meal and insulin. Sliding scale was not giving.Doc notified

## 2022-01-02 NOTE — Progress Notes (Signed)
Subjective: Patient reports pain as mild. Did well with PT yesterday. Urinating. Had a bowel movement yesterday. Tolerating diet. No CP, SOB. Frustrated that she does not have an update on SNF.   Objective:   VITALS:   Vitals:   01/01/22 0821 01/01/22 1455 01/01/22 2149 01/02/22 0548  BP: 124/71 116/72 104/63 131/60  Pulse: 70 74  77  Resp: '16  16 16  '$ Temp: 97.8 F (36.6 C) 98.5 F (36.9 C) 97.9 F (36.6 C) 98.4 F (36.9 C)  TempSrc: Oral Oral Oral   SpO2: 97% 100% 98% 100%  Weight:      Height:          Latest Ref Rng & Units 12/31/2021    4:54 AM 12/29/2021    2:11 AM 12/27/2021    5:00 AM  CBC  WBC 4.0 - 10.5 K/uL  8.0  6.3   Hemoglobin 12.0 - 15.0 g/dL 6.8  7.0  6.8   Hematocrit 36.0 - 46.0 % 22.4  22.7  21.7   Platelets 150 - 400 K/uL  261  226       Latest Ref Rng & Units 12/23/2021    7:05 AM 12/21/2021    3:55 AM 12/11/2021   11:47 AM  BMP  Glucose 70 - 99 mg/dL 139  175  160   BUN 8 - 23 mg/dL '22  21  22   '$ Creatinine 0.44 - 1.00 mg/dL 1.29  1.19  1.17   Sodium 135 - 145 mmol/L 140  137  140   Potassium 3.5 - 5.1 mmol/L 4.0  5.0  4.5   Chloride 98 - 111 mmol/L 108  103  104   CO2 22 - 32 mmol/L '27  25  25   '$ Calcium 8.9 - 10.3 mg/dL 8.4  8.9  9.8    Intake/Output      08/07 0701 08/08 0700   P.O. 720   Total Intake(mL/kg) 720 (7.8)   Net +720       Urine Occurrence 1 x      Physical Exam: General: NAD.  Sitting up in bedside chair, calm, comfortable Resp: No increased wob Cardio: regular rate and rhythm ABD soft Neurologically intact MSK Neurovascularly intact Sensation intact distally Intact pulses distally Dorsiflexion/Plantar flexion intact Incision: dressing C/D/I. Dressing slightly peeling up on the lateral aspect of the aquacel, patient declines bandage change.    Assessment: 13 Days Post-Op  S/P Procedure(s) (LRB): REMOVAL OF RIGHT KNEE ANTIBIOTIC SPACER AND REVISION RIGHT KNEE TOTAL ARTHROPLASTY (Right) by Dr. Zachery Dakins on  12/20/21  Principal Problem:   Prosthetic joint implant failure, initial encounter ([place])   Plan: Hgb has been between 6.8-7.0 for several days. Has known anemia. Patient is a Restaurant manager, fast food.  Advance diet Up with therapy Incentive Spirometry Elevate and Apply ice  Weightbearing: PWB 50%  RLE Insicional and dressing care: Dressings left intact until follow-up and Reinforce dressings as needed Orthopedic device(s): None Showering: Keep dressing dry VTE prophylaxis:  Eliquis '5mg'$  bid  , SCDs, ambulation Pain control: continue current regimen Follow - up plan:  with Dr. Louann Liv information: Dr. Zachery Dakins MD, Noemi Chapel, PA-C  Dispo: Skilled Nursing Facility/Rehab once bed is available. I have reached out to case manager regarding this. Patient would like an update.     Anticipated LOS equal to or greater than 2 midnights due to - Age 75 and older with one or more of the following:  - Obesity  - Expected need for hospital  services (PT, OT, Nursing) required for safe  discharge  - Anticipated need for postoperative skilled nursing care or inpatient rehab  - Active co-morbidities: Diabetes, Coronary Artery Disease, Heart Failure, Heart Attack, and Anemia OR   - Unanticipated findings during/Post Surgery: Infected knee joint or operative site and Slow post-op progression: GI, pain control, mobility  - Patient is a high risk of re-admission due to: Non-elective hospital admission within previous 6 months and Barriers to post-acute care (logistical, no family support in home)   Ethelda Chick, [place] Office [phone] 01/02/2022, 6:44 AM

## 2022-01-02 NOTE — Progress Notes (Signed)
Johnson Siding Md about pt concern about giving blood. She wanted to know if there were other options pt doesn't want to be stuck every day.

## 2022-01-03 DIAGNOSIS — Z743 Need for continuous supervision: Secondary | ICD-10-CM | POA: Diagnosis not present

## 2022-01-03 DIAGNOSIS — R531 Weakness: Secondary | ICD-10-CM | POA: Diagnosis not present

## 2022-01-03 LAB — BASIC METABOLIC PANEL
Anion gap: 7 (ref 5–15)
BUN: 22 mg/dL (ref 8–23)
CO2: 26 mmol/L (ref 22–32)
Calcium: 8.9 mg/dL (ref 8.9–10.3)
Chloride: 105 mmol/L (ref 98–111)
Creatinine, Ser: 1.26 mg/dL — ABNORMAL HIGH (ref 0.44–1.00)
GFR, Estimated: 45 mL/min — ABNORMAL LOW (ref >60)
Glucose, Bld: 121 mg/dL — ABNORMAL HIGH (ref 70–99)
Potassium: 5 mmol/L (ref 3.5–5.1)
Sodium: 138 mmol/L (ref 135–145)

## 2022-01-03 LAB — GLUCOSE, CAPILLARY
Glucose-Capillary: 95 mg/dL (ref 70–99)
Glucose-Capillary: 180 mg/dL — ABNORMAL HIGH (ref 70–99)
Glucose-Capillary: 125 mg/dL — ABNORMAL HIGH (ref 70–99)

## 2022-01-03 MED ORDER — METHOCARBAMOL 500 MG PO TABS: 500.0000 mg | ORAL_TABLET | Freq: Three times a day (TID) | ORAL | 0 refills | Status: AC | PRN

## 2022-01-03 MED ORDER — ONDANSETRON HCL 4 MG PO TABS: 4.0000 mg | ORAL_TABLET | Freq: Three times a day (TID) | ORAL | 0 refills | Status: AC | PRN

## 2022-01-03 MED ORDER — OXYCODONE HCL 5 MG PO TABS: 5.0000 mg | ORAL_TABLET | ORAL | 0 refills | Status: AC | PRN

## 2022-01-03 NOTE — TOC Transition Note (Signed)
Transition of Care [facility]) - CM/SW Discharge Note   Patient Details  Name: Danielle [name] MRN: [id number] Date of Birth: 08-Oct-1945  [facility] CM/SW Contact:  Joanne Chars, LCSW Phone Number: 01/03/2022, 3:19 PM   Clinical Narrative:   Pt discharging to [facility].  RN call [phone] for report.     Final next level of care: [place] Barriers to Discharge: Barriers Resolved   Patient Goals and CMS Choice Patient states their goals for this hospitalization and ongoing recovery are:: walk, run   Choice offered to / list presented to : Patient, Adult Children ([name])  Discharge Placement              Patient chooses bed at:  [facility]) Patient to be transferred to facility by: [place] Name of family member notified: family is out of town--pt will notify them Patient and family notified of of transfer: 01/03/22  Discharge Plan and Services In-house Referral: Clinical Social Work   Post Acute Care Choice: [place]                               Social Determinants of Health (SDOH) Interventions     Readmission Risk Interventions     No data to display

## 2022-01-03 NOTE — Discharge Summary (Signed)
Physician Discharge Summary  Patient ID: Danielle [name] MRN: [id number] DOB/AGE: 75-Jun-1947 75 y.o.  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 05/20/2018   Acute kidney failure ([place]) 05/15/2018   Arthritis    "all over" (05/22/2018)   Blood dyscrasia    per pt-has small blood cells-appears as if anemic   Breast cancer, left breast ([place]) 1985   CHF (congestive heart failure) (H[facility])    Complication of anesthesia    difficult to awaken from per pt   Coronary artery disease    Dyspnea    Essential hypertension    Heart disease    Heart murmur    History of gout    Hypothyroidism    Obesity (BMI 30-39.9) 11/11/2017   OSA (obstructive sleep apnea) 02/18/2017    severe obstructive sleep apnea with an AHI of 75.6/h and mild central sleep apnea with a CAI of 7.7/h.  Oxygen saturations dropped as low as 82%.   He is on CPAP at 9 cm H2O.   OSA on CPAP    Personal history of chemotherapy    Personal history of radiation therapy    Presence of permanent cardiac pacemaker    Primary localized osteoarthritis of right knee 05/13/2018   Refusal of blood transfusions as patient is Jehovah's Witness    Type II diabetes mellitus ([place])     Surgeries: Procedure(s): REMOVAL OF RIGHT KNEE ANTIBIOTIC SPACER AND REVISION RIGHT KNEE TOTAL ARTHROPLASTY on 12/20/2021   Consultants (if any):   Discharged Condition: Improved  Hospital Course: Danielle [name] is an 76 y.o. female who was admitted 12/20/2021 with a diagnosis of Prosthetic joint implant failure, initial encounter ([place]) and went to the operating room on 12/20/2021 and underwent  the above named procedures.  She was admitted post op for pain control and PT. Given hx of anemia and being jehovah's witness hgb was monitored post op. Stabilized around 7. Asymptomatic. Given Epoetin and iron to help improve hgb stores and production. Deemed stable for discharge.  She was given perioperative antibiotics:  Anti-infectives (From admission, onward)    Start     Dose/Rate Route Frequency Ordered Stop   12/20/21 1900  ceFAZolin (ANCEF) IVPB 2g/100 mL premix        2 g 200 mL/hr over 30 Minutes Intravenous Every 8 hours 12/20/21 1624     12/20/21 0833  ceFAZolin (ANCEF) 2-4 GM/100ML-% IVPB       Note to Pharmacy: Humberto Leep O: cabinet override      12/20/21 0833 12/20/21 1116   12/20/21 0830  ceFAZolin (ANCEF) IVPB 2g/100 mL premix        2 g 200 mL/hr over 30 Minutes Intravenous On call to O.R. 12/20/21 0825 12/20/21 1130     .  She was given sequential compression devices, early ambulation, and eliquis for DVT prophylaxis.  She benefited maximally from the hospital stay and there were no complications.    Recent vital signs:  Vitals:   01/02/22 2024 01/03/22 0722  BP: (!) 153/68 (!) 138/57  Pulse: 78 73  Resp: 16   Temp: 98.5 F (36.9 C) 98 F (36.7 C)  SpO2: 97% 98%    Recent  laboratory studies:  Lab Results  Component Value Date   HGB 6.8 (LL) 12/31/2021   HGB 7.0 (L) 12/29/2021   HGB 6.8 (LL) 12/27/2021   Lab Results  Component Value Date   WBC 8.0 12/29/2021   PLT 261 12/29/2021   No results found for: "INR" Lab Results  Component Value Date   NA 138 01/03/2022   K 5.0 01/03/2022   CL 105 01/03/2022   CO2 26 01/03/2022   BUN 22 01/03/2022   CREATININE 1.26 (H) 01/03/2022   GLUCOSE 121 (H) 01/03/2022    Discharge Medications:   Allergies as of 01/03/2022       Reactions   Other Other (See Comments)   Blood Product Refusal (Jehovah's witness)   Tape Other (See Comments)   Leaves blisters and marks on the skin   Sulfa Antibiotics  Rash   Uloric [febuxostat] Rash        Medication List     TAKE these medications    acetaminophen 500 MG tablet Commonly known as: TYLENOL Take 500-1,000 mg by mouth every 6 (six) hours as needed (for pain or headaches).   ALIVE ONCE DAILY WOMENS 50+ PO Take 1 tablet by mouth daily.   allopurinol 100 MG tablet Commonly known as: ZYLOPRIM Take 100 mg by mouth daily.   apixaban 5 MG Tabs tablet Commonly known as: Eliquis Take 1 tablet (5 mg total) by mouth 2 (two) times daily.   atorvastatin 80 MG tablet Commonly known as: LIPITOR Take 1 tablet (80 mg total) by mouth at bedtime.   Basaglar KwikPen 100 UNIT/ML Inject 45 Units into the skin at bedtime.   Biotin 10 MG Tabs Take 10 mg by mouth daily.   carvedilol 3.125 MG tablet Commonly known as: COREG Take 1 tablet (3.125 mg total) by mouth 2 (two) times daily with a meal.   clotrimazole-betamethasone lotion Commonly known as: LOTRISONE Apply 1 application topically 2 (two) times daily as needed (itching).   Dexcom G6 Sensor Misc 1 Device by Does not apply route as directed.   Dexcom G6 Transmitter Misc 1 Device by Does not apply route as directed.   empagliflozin 10 MG Tabs tablet Commonly known as: JARDIANCE Take 10 mg by mouth daily.   folic acid 1 MG tablet Commonly known as: FOLVITE Take 1 tablet (1 mg total) by mouth daily.   gabapentin 300 MG capsule Commonly known as: NEURONTIN Take 300 mg by mouth 2 (two) times daily.   insulin lispro 100 UNIT/ML injection Commonly known as: HUMALOG Inject 0.05 mLs (5 Units total) into the skin 3 (three) times daily before meals.   Iron 325 (65 Fe) MG Tabs Take 1 tablet (325 mg total) by mouth 2 (two) times daily before a meal.   latanoprost 0.005 % ophthalmic solution Commonly known as: XALATAN Place 1 drop into both eyes at bedtime.   levothyroxine 75 MCG tablet Commonly known as: SYNTHROID Take 75 mcg by mouth daily before breakfast.   losartan 25  MG tablet Commonly known as: COZAAR Take 1 tablet (25 mg total) by mouth daily.   Lumigan 0.01 % Soln Generic drug: bimatoprost Place 1 drop into both eyes at bedtime.   methocarbamol 500 MG tablet Commonly known as: ROBAXIN Take 1 tablet (500 mg total) by mouth every 8 (eight) hours as needed for up to 10 days for muscle spasms.   ondansetron 4 MG tablet Commonly known as: Zofran Take 1 tablet (4 mg total) by mouth every 8 (eight) hours  as needed for up to 14 days for nausea or vomiting.   OneTouch Verio w/Device Kit Use to check blood sugar   oxyCODONE 5 MG immediate release tablet Commonly known as: Roxicodone Take 1-2 tablets (5-10 mg total) by mouth every 4 (four) hours as needed for up to 7 days for severe pain or moderate pain.   spironolactone 25 MG tablet Commonly known as: ALDACTONE Take 0.5 tablets (12.5 mg total) by mouth daily.   torsemide 20 MG tablet Commonly known as: DEMADEX Take 1 tablet (20 mg total) by mouth as needed. What changed:  when to take this reasons to take this   Travoprost (BAK Free) 0.004 % Soln ophthalmic solution Commonly known as: TRAVATAN Place 1 drop into both eyes at bedtime.        Diagnostic Studies: DG Knee Right Port  Result Date: 12/20/2021 CLINICAL DATA:  Postop knee replacement EXAM: PORTABLE RIGHT KNEE - 1-2 VIEW COMPARISON:  09/28/2021 FINDINGS: Total knee arthroplasty on the right. Components appear well positioned. No radiographically detectable complication. Air and fluid in the joint as expected. Regional vascular calcification incidentally noted. IMPRESSION: Good appearance following right total knee replacement/revision. Electronically Signed   By: Nelson Chimes M.D.   On: 12/20/2021 16:14    Disposition: Discharge disposition: 03-Skilled Nursing Facility       Discharge Instructions     Call MD / Call 911   Complete by: As directed    If you experience chest pain or shortness of breath, CALL 911 and be  transported to the hospital emergency room.  If you develope a fever above 101 F, pus (white drainage) or increased drainage or redness at the wound, or calf pain, call your surgeon's office.   Constipation Prevention   Complete by: As directed    Drink plenty of fluids.  Prune juice may be helpful.  You may use a stool softener, such as Colace (over the counter) 100 mg twice a day.  Use MiraLax (over the counter) for constipation as needed.   Diet - low sodium heart healthy   Complete by: As directed    Do not put a pillow under the knee. Place it under the heel.   Complete by: As directed    Increase activity slowly as tolerated   Complete by: As directed    Post-operative opioid taper instructions:   Complete by: As directed    POST-OPERATIVE OPIOID TAPER INSTRUCTIONS: It is important to wean off of your opioid medication as soon as possible. If you do not need pain medication after your surgery it is ok to stop day one. Opioids include: Codeine, Hydrocodone(Norco, Vicodin), Oxycodone(Percocet, oxycontin) and hydromorphone amongst others.  Long term and even short term use of opiods can cause: Increased pain response Dependence Constipation Depression Respiratory depression And more.  Withdrawal symptoms can include Flu like symptoms Nausea, vomiting And more Techniques to manage these symptoms Hydrate well Eat regular healthy meals Stay active Use relaxation techniques(deep breathing, meditating, yoga) Do Not substitute Alcohol to help with tapering If you have been on opioids for less than two weeks and do not have pain than it is ok to stop all together.  Plan to wean off of opioids This plan should start within one week post op of your joint replacement. Maintain the same interval or time between taking each dose and first decrease the dose.  Cut the total daily intake of opioids by one tablet each day Next start to increase the time between doses.  The last dose that  should be eliminated is the evening dose.           Follow-up Information     Willaim Sheng, MD Follow up in 6 day(s).   Specialty: Orthopedic Surgery Why: For wound re-check Contact information: [place] [address] [phone]                    Discharge Instructions      INSTRUCTIONS AFTER JOINT REPLACEMENT   Remove items at home which could result in a fall. This includes throw rugs or furniture in walking pathways ICE to the affected joint every three hours while awake for 30 minutes at a time, for at least the first 3-5 days, and then as needed for pain and swelling.  Continue to use ice for pain and swelling. You may notice swelling that will progress down to the foot and ankle.  This is normal after surgery.  Elevate your leg when you are not up walking on it.   Continue to use the breathing machine you got in the hospital (incentive spirometer) which will help keep your temperature down.  It is common for your temperature to cycle up and down following surgery, especially at night when you are not up moving around and exerting yourself.  The breathing machine keeps your lungs expanded and your temperature down.   DIET:  As you were doing prior to hospitalization, we recommend a well-balanced diet.  DRESSING / WOUND CARE / SHOWERING  Keep the surgical dressing until follow up.  The dressing is water proof, so you can shower without any extra covering.  IF THE DRESSING FALLS OFF or the wound gets wet inside, change the dressing with sterile gauze.  Please use good hand washing techniques before changing the dressing.  Do not use any lotions or creams on the incision until instructed by your surgeon.    ACTIVITY  Increase activity slowly as tolerated, but follow the weight bearing instructions below.   No driving for 6 weeks or until further direction given by your physician.  You cannot drive while taking narcotics.  No  lifting or carrying greater than 10 lbs. until further directed by your surgeon. Avoid periods of inactivity such as sitting longer than an hour when not asleep. This helps prevent blood clots.  You may return to work once you are authorized by your doctor.     WEIGHT BEARING  50% partial weight bearing with assist device (walker) for 6 weeks, use it as long as suggested by your surgeon or therapist.   EXERCISES  Results after joint replacement surgery are often greatly improved when you follow the exercise, range of motion and muscle strengthening exercises prescribed by your doctor. Safety measures are also important to protect the joint from further injury. Any time any of these exercises cause you to have increased pain or swelling, decrease what you are doing until you are comfortable again and then slowly increase them. If you have problems or questions, call your caregiver or physical therapist for advice.   Rehabilitation is important following a joint replacement. After just a few days of immobilization, the muscles of the leg can become weakened and shrink (atrophy).  These exercises are designed to build up the tone and strength of the thigh and leg muscles and to improve motion. Often times heat used for twenty to thirty minutes before working out will loosen up your tissues and help with  improving the range of motion but do not use heat for the first two weeks following surgery (sometimes heat can increase post-operative swelling).   These exercises can be done on a training (exercise) mat, on the floor, on a table or on a bed. Use whatever works the best and is most comfortable for you.    Use music or television while you are exercising so that the exercises are a pleasant break in your day. This will make your life better with the exercises acting as a break in your routine that you can look forward to.   Perform all exercises about fifteen times, three times per day or as directed.   You should exercise both the operative leg and the other leg as well.  Exercises include:   Quad Sets - Tighten up the muscle on the front of the thigh (Quad) and hold for 5-10 seconds.   Straight Leg Raises - With your knee straight (if you were given a brace, keep it on), lift the leg to 60 degrees, hold for 3 seconds, and slowly lower the leg.  Perform this exercise against resistance later as your leg gets stronger.  Leg Slides: Lying on your back, slowly slide your foot toward your buttocks, bending your knee up off the floor (only go as far as is comfortable). Then slowly slide your foot back down until your leg is flat on the floor again.  Angel Wings: Lying on your back spread your legs to the side as far apart as you can without causing discomfort.  Hamstring Strength:  Lying on your back, push your heel against the floor with your leg straight by tightening up the muscles of your buttocks.  Repeat, but this time bend your knee to a comfortable angle, and push your heel against the floor.  You may put a pillow under the heel to make it more comfortable if necessary.   A rehabilitation program following joint replacement surgery can speed recovery and prevent re-injury in the future due to weakened muscles. Contact your doctor or a physical therapist for more information on knee rehabilitation.    CONSTIPATION  Constipation is defined medically as fewer than three stools per week and severe constipation as less than one stool per week.  Even if you have a regular bowel pattern at home, your normal regimen is likely to be disrupted due to multiple reasons following surgery.  Combination of anesthesia, postoperative narcotics, change in appetite and fluid intake all can affect your bowels.   YOU MUST use at least one of the following options; they are listed in order of increasing strength to get the job done.  They are all available over the counter, and you may need to use some, POSSIBLY  even all of these options:    Drink plenty of fluids (prune juice may be helpful) and high fiber foods Colace 100 mg by mouth twice a day  Senokot for constipation as directed and as needed Dulcolax (bisacodyl), take with full glass of water  Miralax (polyethylene glycol) once or twice a day as needed.  If you have tried all these things and are unable to have a bowel movement in the first 3-4 days after surgery call either your surgeon or your primary doctor.    If you experience loose stools or diarrhea, hold the medications until you stool forms back up.  If your symptoms do not get better within 1 week or if they get worse, check with your doctor.  If you experience "the worst abdominal pain ever" or develop nausea or vomiting, please contact the office immediately for further recommendations for treatment.   ITCHING:  If you experience itching with your medications, try taking only a single pain pill, or even half a pain pill at a time.  You can also use Benadryl over the counter for itching or also to help with sleep.   TED HOSE STOCKINGS:  Use stockings on both legs until for at least 2 weeks or as directed by physician office. They may be removed at night for sleeping.  MEDICATIONS:  See your medication summary on the "After Visit Summary" that nursing will review with you.  You may have some home medications which will be placed on hold until you complete the course of blood thinner medication.  It is important for you to complete the blood thinner medication as prescribed.   Blood clot prevention (DVT Prophylaxis): After surgery you are at an increased risk for a blood clot. you are on eliquis which will help to help reduce your risk of getting a blood clot. This will help prevent a blood clot. Signs of a pulmonary embolus (blood clot in the lungs) include sudden short of breath, feeling lightheaded or dizzy, chest pain with a deep breath, rapid pulse rapid breathing. Signs of a blood  clot in your arms or legs include new unexplained swelling and cramping, warm, red or darkened skin around the painful area. Please call the office or 911 right away if these signs or symptoms develop.  PRECAUTIONS:  If you experience chest pain or shortness of breath - call 911 immediately for transfer to the hospital emergency department.   If you develop a fever greater that 101 F, purulent drainage from wound, increased redness or drainage from wound, foul odor from the wound/dressing, or calf pain - CONTACT YOUR SURGEON.                                                   FOLLOW-UP APPOINTMENTS:  If you do not already have a post-op appointment, please call the office for an appointment to be seen by your surgeon.  Guidelines for how soon to be seen are listed in your "After Visit Summary". We will see you about 1 week after discharge for a wound check.  OTHER INSTRUCTIONS:   Knee Replacement:  Do not place pillow under knee, focus on keeping the knee straight while resting. DO NOT modify, tear, cut, or change the foam block in any way.  POST-OPERATIVE OPIOID TAPER INSTRUCTIONS: It is important to wean off of your opioid medication as soon as possible. If you do not need pain medication after your surgery it is ok to stop day one. Opioids include: Codeine, Hydrocodone(Norco, Vicodin), Oxycodone(Percocet, oxycontin) and hydromorphone amongst others.  Long term and even short term use of opiods can cause: Increased pain response Dependence Constipation Depression Respiratory depression And more.  Withdrawal symptoms can include Flu like symptoms Nausea, vomiting And more Techniques to manage these symptoms Hydrate well Eat regular healthy meals Stay active Use relaxation techniques(deep breathing, meditating, yoga) Do Not substitute Alcohol to help with tapering If you have been on opioids for less than two weeks and do not have pain than it is ok to stop all together.  Plan to  wean off of opioids This plan  should start within one week post op of your joint replacement. Maintain the same interval or time between taking each dose and first decrease the dose.  Cut the total daily intake of opioids by one tablet each day Next start to increase the time between doses. The last dose that should be eliminated is the evening dose.   MAKE SURE YOU:  Understand these instructions.  Get help right away if you are not doing well or get worse.    Thank you for letting us be a part of your medical care team.  It is a privilege we respect greatly.  We hope these instructions will help you stay on track for a fast and full recovery!        Carbohydrate Counting For People With Diabetes  Foods with carbohydrates make your blood glucose level go up. Learning how to count carbohydrates can help you control your blood glucose levels. First, identify the foods you eat that contain carbohydrates. Then, using the Foods with Carbohydrates chart, determine about how much carbohydrates are in your meals and snacks. Make sure you are eating foods with fiber, protein, and healthy fat along with your carbohydrate foods. Foods with Carbohydrates The following table shows carbohydrate foods that have about 15 grams of carbohydrate each. Using measuring cups, spoons, or a food scale when you first begin learning about carbohydrate counting can help you learn about the portion sizes you typically eat. The following foods have 15 grams carbohydrate each:  Grains 1 slice bread (1 ounce)  1 small tortilla (6-inch size)   large bagel (1 ounce)  1/3 cup pasta or rice (cooked)   hamburger or hot dog bun ( ounce)   cup cooked cereal   to  cup ready-to-eat cereal  2 taco shells (5-inch size) Fruit 1 small fresh fruit ( to 1 cup)   medium banana  17 small grapes (3 ounces)  1 cup melon or berries   cup canned or frozen fruit  2 tablespoons dried fruit (blueberries, cherries,  cranberries, raisins)   cup unsweetened fruit juice  Starchy Vegetables  cup cooked beans, peas, corn, potatoes/sweet potatoes   large baked potato (3 ounces)  1 cup acorn or butternut squash  Snack Foods 3 to 6 crackers  8 potato chips or 13 tortilla chips ( ounce to 1 ounce)  3 cups popped popcorn  Dairy 3/4 cup (6 ounces) nonfat plain yogurt, or yogurt with sugar-free sweetener  1 cup milk  1 cup plain rice, soy, coconut or flavored almond milk Sweets and Desserts  cup ice cream or frozen yogurt  1 tablespoon jam, jelly, pancake syrup, table sugar, or honey  2 tablespoons light pancake syrup  1 inch square of frosted cake or 2 inch square of unfrosted cake  2 small cookies (2/3 ounce each) or  large cookie  Sometimes you'll have to estimate carbohydrate amounts if you don't know the exact recipe. One cup of mixed foods like soups can have 1 to 2 carbohydrate servings, while some casseroles might have 2 or more servings of carbohydrate. Foods that have less than 20 calories in each serving can be counted as "free" foods. Count 1 cup raw vegetables, or  cup cooked non-starchy vegetables as "free" foods. If you eat 3 or more servings at one meal, then count them as 1 carbohydrate serving.  Foods without Carbohydrates  Not all foods contain carbohydrates. Meat, some dairy, fats, non-starchy vegetables, and many beverages don't contain carbohydrate. So when you count  carbohydrates, you can generally exclude chicken, pork, beef, fish, seafood, eggs, tofu, cheese, butter, sour cream, avocado, nuts, seeds, olives, mayonnaise, water, black coffee, unsweetened tea, and zero-calorie drinks. Vegetables with no or low carbohydrate include green beans, cauliflower, tomatoes, and onions. How much carbohydrate should I eat at each meal?  Carbohydrate counting can help you plan your meals and manage your weight. Following are some starting points for carbohydrate intake at each meal. Work with your  registered dietitian nutritionist to find the best range that works for your blood glucose and weight.   To Lose Weight To Maintain Weight  Women 2 - 3 carb servings 3 - 4 carb servings  Men 3 - 4 carb servings 4 - 5 carb servings  Checking your blood glucose after meals will help you know if you need to adjust the timing, type, or number of carbohydrate servings in your meal plan. Achieve and keep a healthy body weight by balancing your food intake and physical activity.  Tips How should I plan my meals?  Plan for half the food on your plate to include non-starchy vegetables, like salad greens, broccoli, or carrots. Try to eat 3 to 5 servings of non-starchy vegetables every day. Have a protein food at each meal. Protein foods include chicken, fish, meat, eggs, or beans (note that beans contain carbohydrate). These two food groups (non-starchy vegetables and proteins) are low in carbohydrate. If you fill up your plate with these foods, you will eat less carbohydrate but still fill up your stomach. Try to limit your carbohydrate portion to  of the plate.  What fats are healthiest to eat?  Diabetes increases risk for heart disease. To help protect your heart, eat more healthy fats, such as olive oil, nuts, and avocado. Eat less saturated fats like butter, cream, and high-fat meats, like bacon and sausage. Avoid trans fats, which are in all foods that list "partially hydrogenated oil" as an ingredient. What should I drink?  Choose drinks that are not sweetened with sugar. The healthiest choices are water, carbonated or seltzer waters, and tea and coffee without added sugars.  Sweet drinks will make your blood glucose go up very quickly. One serving of soda or energy drink is  cup. It is best to drink these beverages only if your blood glucose is low.  Artificially sweetened, or diet drinks, typically do not increase your blood glucose if they have zero calories in them. Read labels of beverages, as some  diet drinks do have carbohydrate and will raise your blood glucose. Label Reading Tips Read Nutrition Facts labels to find out how many grams of carbohydrate are in a food you want to eat. Don't forget: sometimes serving sizes on the label aren't the same as how much food you are going to eat, so you may need to calculate how much carbohydrate is in the food you are serving yourself.   Carbohydrate Counting for People with Diabetes Sample 1-Day Menu  Breakfast  cup yogurt, low fat, low sugar (1 carbohydrate serving)   cup cereal, ready-to-eat, unsweetened (1 carbohydrate serving)  1 cup strawberries (1 carbohydrate serving)   cup almonds ( carbohydrate serving)  Lunch 1, 5 ounce can chunk light tuna  2 ounces cheese, low fat cheddar  6 whole wheat crackers (1 carbohydrate serving)  1 small apple (1 carbohydrate servings)   cup carrots ( carbohydrate serving)   cup snap peas  1 cup 1% milk (1 carbohydrate serving)   Evening Meal Stir fry made  with: 3 ounces chicken  1 cup brown rice (3 carbohydrate servings)   cup broccoli ( carbohydrate serving)   cup green beans   cup onions  1 tablespoon olive oil  2 tablespoons teriyaki sauce ( carbohydrate serving)  Evening Snack 1 extra small banana (1 carbohydrate serving)  1 tablespoon peanut butter   Carbohydrate Counting for People with Diabetes Vegan Sample 1-Day Menu  Breakfast 1 cup cooked oatmeal (2 carbohydrate servings)   cup blueberries (1 carbohydrate serving)  2 tablespoons flaxseeds  1 cup soymilk fortified with calcium and vitamin D  1 cup coffee  Lunch 2 slices whole wheat bread (2 carbohydrate servings)   cup baked tofu   cup lettuce  2 slices tomato  2 slices avocado   cup baby carrots ( carbohydrate serving)  1 orange (1 carbohydrate serving)  1 cup soymilk fortified with calcium and vitamin D   Evening Meal Burrito made with: 1 6-inch corn tortilla (1 carbohydrate serving)  1 cup refried vegetarian  beans (2 carbohydrate servings)   cup chopped tomatoes   cup lettuce   cup salsa  1/3 cup brown rice (1 carbohydrate serving)  1 tablespoon olive oil for rice   cup zucchini   Evening Snack 6 small whole grain crackers (1 carbohydrate serving)  2 apricots ( carbohydrate serving)   cup unsalted peanuts ( carbohydrate serving)    Carbohydrate Counting for People with Diabetes Vegetarian (Lacto-Ovo) Sample 1-Day Menu  Breakfast 1 cup cooked oatmeal (2 carbohydrate servings)   cup blueberries (1 carbohydrate serving)  2 tablespoons flaxseeds  1 egg  1 cup 1% milk (1 carbohydrate serving)  1 cup coffee  Lunch 2 slices whole wheat bread (2 carbohydrate servings)  2 ounces low-fat cheese   cup lettuce  2 slices tomato  2 slices avocado   cup baby carrots ( carbohydrate serving)  1 orange (1 carbohydrate serving)  1 cup unsweetened tea  Evening Meal Burrito made with: 1 6-inch corn tortilla (1 carbohydrate serving)   cup refried vegetarian beans (1 carbohydrate serving)   cup tomatoes   cup lettuce   cup salsa  1/3 cup brown rice (1 carbohydrate serving)  1 tablespoon olive oil for rice   cup zucchini  1 cup 1% milk (1 carbohydrate serving)  Evening Snack 6 small whole grain crackers (1 carbohydrate serving)  2 apricots ( carbohydrate serving)   cup unsalted peanuts ( carbohydrate serving)    Copyright 2020  Academy of [employer] and Dietetics. All rights reserved.  Using Nutrition Labels: Carbohydrate  Serving Size  Look at the serving size. All the information on the label is based on this portion. Servings Per Container  The number of servings contained in the package. Guidelines for Carbohydrate  Look at the total grams of carbohydrate in the serving size.  1 carbohydrate choice = 15 grams of carbohydrate. Range of Carbohydrate Grams Per Choice  Carbohydrate Grams/Choice Carbohydrate Choices  6-10   11-20 1  21-25 1  26-35 2  36-40 2  41-50 3   51-55 3  56-65 4  66-70 4  71-80 5    Copyright 2020  Academy of Nutrition and Dietetics. All rights reserved.     Signed: Miguelina Fore A Hollin Crewe 01/03/2022, 3:12 PM

## 2022-01-03 NOTE — Plan of Care (Signed)

## 2022-01-03 NOTE — Progress Notes (Signed)
Physical Therapy Treatment Patient Details Name: Danielle [name] MRN: [id number] DOB: May 27, 1946 Today's Date: 01/03/2022   History of Present Illness 76 y.o. female who presents 12/20/21 for FAILED TOTAL KNEE RIGHT. The patient has a history of pain and functional disability in the right knee due to infected TKA prosthesis. Joint prothesis explant with placement of antibiotic spacer in 06/2021; 7/26 Rt TKA revision   PMH: chronic combined systolic and diastolic CHF, CKD stage III, IDDM, HTN, bilateral knee replacement, breast cancer s/p chemo and lumpectomy, OSA on CPAP, hypothyroidism.    PT Comments    Pt able to progress to greater gait distance although seated rest break required between trials. Pt limited by pain and fatigue. Pt continues to require max cues for 50% PWB status. Will progress as tolerated.    ** Pt reported feeling wetness near right armpit during ambulation. Pt's back gown was removed after completion of gait. Pt's IV checked on R arm and was not in place. Pt had no episode during physical therapy in which she felt pulling or pain to right arm; adequate slack on IV was maintained throughout. No swelling or pain present to right arm. Nurse made aware and present after session complete. **  Recommendations for follow up therapy are one component of a multi-disciplinary discharge planning process, led by the attending physician.  Recommendations may be updated based on patient status, additional functional criteria and insurance authorization.  Follow Up Recommendations  Skilled nursing-short term rehab (<3 hours/day) Can patient physically be transported by private vehicle: No   Assistance Recommended at Discharge Frequent or constant Supervision/Assistance  Patient can return home with the following Two people to help with walking and/or transfers;Assistance with cooking/housework;Direct supervision/assist for medications management;Direct supervision/assist for financial  management;Assist for transportation;Help with stairs or ramp for entrance   Equipment Recommendations  None recommended by PT    Recommendations for Other Services       Precautions / Restrictions Precautions Precautions: Fall;Knee Restrictions Weight Bearing Restrictions: Yes RLE Weight Bearing: Partial weight bearing RLE Partial Weight Bearing Percentage or Pounds: 50%     Mobility  Bed Mobility               General bed mobility comments: Pt OOB in chair upon arrival.    Transfers Overall transfer level: Needs assistance Equipment used: Rolling walker (2 wheels) Transfers: Sit to/from Stand Sit to Stand: Min assist           General transfer comment: Cues for hand placements, WB status, technique, and safety awareness. Two sit <> stands this encounter from chair.    Ambulation/Gait Ambulation/Gait assistance: +2 safety/equipment, Min guard Gait Distance (Feet): 15 Feet Assistive device: Rolling walker (2 wheels) (chair follow) Gait Pattern/deviations: Decreased stance time - right, Decreased step length - left, Decreased step length - right, Step-to pattern, Antalgic Gait velocity: decreased Gait velocity interpretation: <1.31 ft/sec, indicative of household ambulator   General Gait Details: Pt required increased cues to maintain WB status including sliding L foot on floor to advance during swing and to utilize B UE's on RW to avoid placing too much weight on R LE. Pt also required cues for sequencing and turns (pt inconsistent with which foot to lead with). Pt with slow pace and required seated rest break (ambulated 8 feet followed by 7 feet). No LOB occurred.   Stairs             Wheelchair Mobility    Modified Rankin (Stroke Patients Only)  Balance Overall balance assessment: Needs assistance         Standing balance support: Reliant on assistive device for balance, Bilateral upper extremity supported Standing balance-Leahy Scale:  Poor                              Cognition Arousal/Alertness: Awake/alert Behavior During Therapy: Anxious Overall Cognitive Status: Impaired/Different from baseline Area of Impairment: Attention, Following commands, Safety/judgement, Problem solving                   Current Attention Level: Focused   Following Commands: Follows one step commands inconsistently, Follows one step commands with increased time Safety/Judgement: Decreased awareness of safety, Decreased awareness of deficits   Problem Solving: Slow processing, Decreased initiation, Difficulty sequencing, Requires verbal cues, Requires tactile cues General Comments: Pt with slow processing and requires increased time to complete tasks.        Exercises      General Comments        Pertinent Vitals/Pain Pain Assessment Pain Assessment: 0-10 Pain Score: 8  Pain Location: rt knee Pain Descriptors / Indicators: Constant, Operative site guarding, Crying Pain Intervention(s): Monitored during session, Limited activity within patient's tolerance, Premedicated before session    Home Living                          Prior Function            PT Goals (current goals can now be found in the care plan section) Acute Rehab PT Goals Patient Stated Goal: to go to SNF PT Goal Formulation: With patient Time For Goal Achievement: 01/04/22 Potential to Achieve Goals: Fair Progress towards PT goals: Progressing toward goals    Frequency    Min 3X/week      PT Plan Current plan remains appropriate    Co-evaluation              AM-PAC PT "6 Clicks" Mobility   Outcome Measure  Help needed turning from your back to your side while in a flat bed without using bedrails?: A Little Help needed moving from lying on your back to sitting on the side of a flat bed without using bedrails?: A Little Help needed moving to and from a bed to a chair (including a wheelchair)?: A Little Help  needed standing up from a chair using your arms (e.g., wheelchair or bedside chair)?: A Little Help needed to walk in hospital room?: A Lot Help needed climbing 3-5 steps with a railing? : Total 6 Click Score: 15    End of Session Equipment Utilized During Treatment: Gait belt Activity Tolerance: Patient tolerated treatment well;Patient limited by fatigue;Patient limited by pain Patient left: with call bell/phone within reach;in chair;with chair alarm set Nurse Communication: Mobility status PT Visit Diagnosis: Unsteadiness on feet (R26.81);Difficulty in walking, not elsewhere classified (R26.2);Muscle weakness (generalized) (M62.81);Pain Pain - Right/Left: Right Pain - part of body: Knee     Time: 8453-6468 PT Time Calculation (min) (ACUTE ONLY): 25 min  Charges:  $Gait Training: 23-37 mins                    Donna Bernard, PT    [facility] 01/03/2022, 11:08 AM

## 2022-01-03 NOTE — Progress Notes (Signed)
Pt. Iv come out during physical therapy, she refused to let me attempt placement and requested IV team

## 2022-01-03 NOTE — Progress Notes (Signed)
Subjective: Patient reports pain as mild. Moving the right knee well. She is frustrated that she is still in the hospital. Encouraged her to work with PT and be out of bed with nursing daily.   Objective:   VITALS:   Vitals:   01/02/22 0720 01/02/22 1447 01/02/22 2024 01/03/22 0722  BP: 137/68 133/62 (!) 153/68 (!) 138/57  Pulse: 74 72 78 73  Resp: 17  16   Temp: 97.6 F (36.4 C) 98.5 F (36.9 C) 98.5 F (36.9 C) 98 F (36.7 C)  TempSrc: Oral Oral Oral Oral  SpO2: 95% 98% 97% 98%  Weight:      Height:          Latest Ref Rng & Units 12/31/2021    4:54 AM 12/29/2021    2:11 AM 12/27/2021    5:00 AM  CBC  WBC 4.0 - 10.5 K/uL  8.0  6.3   Hemoglobin 12.0 - 15.0 g/dL 6.8  7.0  6.8   Hematocrit 36.0 - 46.0 % 22.4  22.7  21.7   Platelets 150 - 400 K/uL  261  226       Latest Ref Rng & Units 01/03/2022    2:00 AM 01/02/2022    7:44 AM 12/23/2021    7:05 AM  BMP  Glucose 70 - 99 mg/dL 121  121  139   BUN 8 - 23 mg/dL '22  28  22   '$ Creatinine 0.44 - 1.00 mg/dL 1.26  1.40  1.29   Sodium 135 - 145 mmol/L 138  141  140   Potassium 3.5 - 5.1 mmol/L 5.0  5.4  4.0   Chloride 98 - 111 mmol/L 105  106  108   CO2 22 - 32 mmol/L '26  22  27   '$ Calcium 8.9 - 10.3 mg/dL 8.9  8.9  8.4    Intake/Output      08/08 0701 08/09 0700 08/09 0701 08/10 0700   P.O. 240    Total Intake(mL/kg) 240 (2.6)    Urine (mL/kg/hr) 900 (0.4)    Total Output 900    Net -660            Physical Exam: General: NAD.  Sitting up in bedside chair, calm, comfortable Resp: No increased wob Cardio: regular rate and rhythm ABD soft Neurologically intact MSK Neurovascularly intact Sensation intact distally Intact pulses distally Dorsiflexion/Plantar flexion intact Incision: dressing C/D/I. Dressing slightly peeling up on the lateral aspect of the aquacel, patient declines bandage change.    Assessment: 14 Days Post-Op  S/P Procedure(s) (LRB): REMOVAL OF RIGHT KNEE ANTIBIOTIC SPACER AND REVISION RIGHT  KNEE TOTAL ARTHROPLASTY (Right) by Dr. Zachery Dakins on 12/20/21  Principal Problem:   Prosthetic joint implant failure, initial encounter ([place])   Plan: Hgb has been between 6.8-7.0 for several days. Has known anemia. Patient is a Restaurant manager, fast food.  Advance diet Up with therapy Incentive Spirometry Elevate and Apply ice  Weightbearing: PWB 50%  RLE Insicional and dressing care: Dressings left intact until follow-up and Reinforce dressings as needed Orthopedic device(s): None Showering: Keep dressing dry VTE prophylaxis:  Eliquis '5mg'$  bid  , SCDs, ambulation Pain control: continue current regimen Follow - up plan:  with Dr. Louann Liv information: Dr. Zachery Dakins MD, Noemi Chapel, PA-C  Dispo: Skilled Nursing Facility/Rehab once bed is available. I have reached out to case manager regarding this. Patient would like an update.     Anticipated LOS equal to or greater than 2 midnights due to -  Age 75 and older with one or more of the following:  - Obesity  - Expected need for hospital services (PT, OT, Nursing) required for safe  discharge  - Anticipated need for postoperative skilled nursing care or inpatient rehab  - Active co-morbidities: Diabetes, Coronary Artery Disease, Heart Failure, Heart Attack, and Anemia OR   - Unanticipated findings during/Post Surgery: Infected knee joint or operative site and Slow post-op progression: GI, pain control, mobility  - Patient is a high risk of re-admission due to: Non-elective hospital admission within previous 6 months and Barriers to post-acute care (logistical, no family support in home)   Willaim Sheng, PA-C Office [phone] 01/03/2022, 7:24 AM

## 2022-01-03 NOTE — Progress Notes (Signed)
8/9 Spoke to patient via telephone, I explained the letter and rcv'd confirmation. Pt received initial IM Letter and follow-up letter will be mailed to patient's address on file.

## 2022-01-03 NOTE — Progress Notes (Signed)
Attempted to give report to [place] was sent to a voicemail each time, voicemail left with me contact infomation

## 2022-01-03 NOTE — TOC Progression Note (Addendum)
Transition of Care [facility]) - Progression Note    Patient Details  Name: Danielle [name] MRN: [id number] Date of Birth: 04-Aug-1945  Transition of Care [facility]) CM/SW Contact  Joanne Chars, LCSW Phone Number: 01/03/2022, 1:17 PM  Clinical Narrative:    Message from [name], still no approval for SNF.  1315: TC Dorise Bullion now asking for MD progress notes, CSW faxed. Auth still pending.  1500: TC Christine. Auth approved, they can take pt today.  MD notified.   Expected Discharge Plan: [place] Barriers to Discharge: Continued Medical Work up, SNF Pending bed offer  Expected Discharge Plan and Services Expected Discharge Plan: [place] In-house Referral: Clinical Social Work   Post Acute Care Choice: [place] Living arrangements for the past 2 months: Single Family Home                                       Social Determinants of Health (SDOH) Interventions    Readmission Risk Interventions     No data to display

## 2022-01-04 DIAGNOSIS — R262 Difficulty in walking, not elsewhere classified: Secondary | ICD-10-CM | POA: Diagnosis not present

## 2022-01-04 DIAGNOSIS — I509 Heart failure, unspecified: Secondary | ICD-10-CM | POA: Diagnosis not present

## 2022-01-04 DIAGNOSIS — G4733 Obstructive sleep apnea (adult) (pediatric): Secondary | ICD-10-CM | POA: Diagnosis not present

## 2022-01-04 DIAGNOSIS — M6281 Muscle weakness (generalized): Secondary | ICD-10-CM | POA: Diagnosis not present

## 2022-01-04 DIAGNOSIS — T8459XD Infection and inflammatory reaction due to other internal joint prosthesis, subsequent encounter: Secondary | ICD-10-CM | POA: Diagnosis not present

## 2022-01-04 DIAGNOSIS — E669 Obesity, unspecified: Secondary | ICD-10-CM | POA: Diagnosis not present

## 2022-01-04 DIAGNOSIS — E113412 Type 2 diabetes mellitus with severe nonproliferative diabetic retinopathy with macular edema, left eye: Secondary | ICD-10-CM | POA: Diagnosis not present

## 2022-01-04 DIAGNOSIS — Z96651 Presence of right artificial knee joint: Secondary | ICD-10-CM | POA: Diagnosis not present

## 2022-01-04 DIAGNOSIS — E113411 Type 2 diabetes mellitus with severe nonproliferative diabetic retinopathy with macular edema, right eye: Secondary | ICD-10-CM | POA: Diagnosis not present

## 2022-01-04 DIAGNOSIS — N183 Chronic kidney disease, stage 3 unspecified: Secondary | ICD-10-CM | POA: Diagnosis not present

## 2022-01-04 DIAGNOSIS — C801 Malignant (primary) neoplasm, unspecified: Secondary | ICD-10-CM | POA: Diagnosis not present

## 2022-01-04 DIAGNOSIS — R2689 Other abnormalities of gait and mobility: Secondary | ICD-10-CM | POA: Diagnosis not present

## 2022-01-04 DIAGNOSIS — I10 Essential (primary) hypertension: Secondary | ICD-10-CM | POA: Diagnosis not present

## 2022-01-04 DIAGNOSIS — E1165 Type 2 diabetes mellitus with hyperglycemia: Secondary | ICD-10-CM | POA: Diagnosis not present

## 2022-01-04 DIAGNOSIS — Z471 Aftercare following joint replacement surgery: Secondary | ICD-10-CM | POA: Diagnosis not present

## 2022-01-04 DIAGNOSIS — E039 Hypothyroidism, unspecified: Secondary | ICD-10-CM | POA: Diagnosis not present

## 2022-01-04 DIAGNOSIS — I480 Paroxysmal atrial fibrillation: Secondary | ICD-10-CM | POA: Diagnosis not present

## 2022-01-04 DIAGNOSIS — I2510 Atherosclerotic heart disease of native coronary artery without angina pectoris: Secondary | ICD-10-CM | POA: Diagnosis not present

## 2022-01-04 DIAGNOSIS — G8918 Other acute postprocedural pain: Secondary | ICD-10-CM | POA: Diagnosis not present

## 2022-01-04 DIAGNOSIS — M1990 Unspecified osteoarthritis, unspecified site: Secondary | ICD-10-CM | POA: Diagnosis not present

## 2022-01-04 DIAGNOSIS — E119 Type 2 diabetes mellitus without complications: Secondary | ICD-10-CM | POA: Diagnosis not present

## 2022-01-04 DIAGNOSIS — E785 Hyperlipidemia, unspecified: Secondary | ICD-10-CM | POA: Diagnosis not present

## 2022-01-04 DIAGNOSIS — I5030 Unspecified diastolic (congestive) heart failure: Secondary | ICD-10-CM | POA: Diagnosis not present

## 2022-01-08 DIAGNOSIS — N183 Chronic kidney disease, stage 3 unspecified: Secondary | ICD-10-CM | POA: Diagnosis not present

## 2022-01-08 DIAGNOSIS — E119 Type 2 diabetes mellitus without complications: Secondary | ICD-10-CM | POA: Diagnosis not present

## 2022-01-08 DIAGNOSIS — E039 Hypothyroidism, unspecified: Secondary | ICD-10-CM | POA: Diagnosis not present

## 2022-01-08 DIAGNOSIS — E785 Hyperlipidemia, unspecified: Secondary | ICD-10-CM | POA: Diagnosis not present

## 2022-01-08 DIAGNOSIS — M1990 Unspecified osteoarthritis, unspecified site: Secondary | ICD-10-CM | POA: Diagnosis not present

## 2022-01-08 DIAGNOSIS — I2510 Atherosclerotic heart disease of native coronary artery without angina pectoris: Secondary | ICD-10-CM | POA: Diagnosis not present

## 2022-01-08 DIAGNOSIS — I509 Heart failure, unspecified: Secondary | ICD-10-CM | POA: Diagnosis not present

## 2022-01-08 DIAGNOSIS — I480 Paroxysmal atrial fibrillation: Secondary | ICD-10-CM | POA: Diagnosis not present

## 2022-01-08 DIAGNOSIS — G4733 Obstructive sleep apnea (adult) (pediatric): Secondary | ICD-10-CM | POA: Diagnosis not present

## 2022-01-09 ENCOUNTER — Encounter (INDEPENDENT_AMBULATORY_CARE_PROVIDER_SITE_OTHER): Payer: Medicare HMO | Admitting: Ophthalmology

## 2022-01-09 ENCOUNTER — Telehealth (HOSPITAL_COMMUNITY): Payer: Self-pay

## 2022-01-09 NOTE — Telephone Encounter (Signed)
Danielle [name] advised she is still admitted to [facility] on [address] and will not be at her follow up in clinic on Thursday. She reports she does not have a set discharge date as of now but plans to let me know once she knows. Call forwarded to HF clinic triage to have them remove her from their schedule. Call complete.   [name], [place] 01/09/2022

## 2022-01-10 NOTE — Telephone Encounter (Signed)
Appt cancelled

## 2022-01-11 ENCOUNTER — Encounter (HOSPITAL_COMMUNITY): Payer: Medicare HMO

## 2022-01-11 DIAGNOSIS — E1165 Type 2 diabetes mellitus with hyperglycemia: Secondary | ICD-10-CM | POA: Diagnosis not present

## 2022-01-15 DIAGNOSIS — E785 Hyperlipidemia, unspecified: Secondary | ICD-10-CM | POA: Diagnosis not present

## 2022-01-15 DIAGNOSIS — I509 Heart failure, unspecified: Secondary | ICD-10-CM | POA: Diagnosis not present

## 2022-01-15 DIAGNOSIS — I2510 Atherosclerotic heart disease of native coronary artery without angina pectoris: Secondary | ICD-10-CM | POA: Diagnosis not present

## 2022-01-15 DIAGNOSIS — M1990 Unspecified osteoarthritis, unspecified site: Secondary | ICD-10-CM | POA: Diagnosis not present

## 2022-01-15 DIAGNOSIS — N183 Chronic kidney disease, stage 3 unspecified: Secondary | ICD-10-CM | POA: Diagnosis not present

## 2022-01-15 DIAGNOSIS — I480 Paroxysmal atrial fibrillation: Secondary | ICD-10-CM | POA: Diagnosis not present

## 2022-01-15 DIAGNOSIS — E039 Hypothyroidism, unspecified: Secondary | ICD-10-CM | POA: Diagnosis not present

## 2022-01-15 DIAGNOSIS — G4733 Obstructive sleep apnea (adult) (pediatric): Secondary | ICD-10-CM | POA: Diagnosis not present

## 2022-01-15 DIAGNOSIS — E119 Type 2 diabetes mellitus without complications: Secondary | ICD-10-CM | POA: Diagnosis not present

## 2022-01-22 DIAGNOSIS — N183 Chronic kidney disease, stage 3 unspecified: Secondary | ICD-10-CM | POA: Diagnosis not present

## 2022-01-22 DIAGNOSIS — E781 Pure hyperglyceridemia: Secondary | ICD-10-CM | POA: Diagnosis not present

## 2022-01-22 DIAGNOSIS — M1990 Unspecified osteoarthritis, unspecified site: Secondary | ICD-10-CM | POA: Diagnosis not present

## 2022-01-22 DIAGNOSIS — I480 Paroxysmal atrial fibrillation: Secondary | ICD-10-CM | POA: Diagnosis not present

## 2022-01-22 DIAGNOSIS — I509 Heart failure, unspecified: Secondary | ICD-10-CM | POA: Diagnosis not present

## 2022-01-25 DIAGNOSIS — T8453XD Infection and inflammatory reaction due to internal right knee prosthesis, subsequent encounter: Secondary | ICD-10-CM | POA: Diagnosis not present

## 2022-01-30 ENCOUNTER — Other Ambulatory Visit (HOSPITAL_COMMUNITY): Payer: Self-pay

## 2022-01-30 NOTE — Progress Notes (Signed)
Paramedicine Encounter    Patient ID: Danielle [name], female    DOB: [birth year], 76 y.o.   MRN: [id number]   Arrived for home visit for [facility] as she returned home from SNF on 8/28. She is ambulating with her rollator in the home and reports she is doing well but having mild pain with swelling around her right knee which was operated on. She has been taking her medications on her own over the last week but reports she feels she was doing it wrong and didn't have all the medications she needs. Today we reviewed all notes from hospital stay and her SNF stay. We reviewed all medications and we re-educated on same and I filled pill box for one week and will plan to drop off refill prescriptions to [facility] and send messages to providers for others needed. I helped her get set up with [facility] who plans to come out tomorrow morning at 0930. She has a PCP follow up we scheduled on Thursday at 10:00 and she will see HF clinic and Percell Miller and [place] near the end of the month.   I obtained vitals as noted: WT- 207lbs BP- 110/70 HR- 69 O2- 97% CBG- 118 RR- 18  Some swelling around right knee- surgical site, healing nicely no redness or weeping. Bilateral lower leg swelling minimal pitting edema. She did take a torsemide yesterday she reports.   Lungs clear. HR regular- she reports she felt some irregular beats over the last few days but has been off her CPAP for over 3 months and was unsure if she was taking meds correctly. She plans to restart CPAP tonight and I corrected meds in pill box for her today.   She obtained Humalog from [facility] She obtained Eliquis from [facility] She obtained Dexcom transmitter in mail.  I placed new Dexcom sensor on her today and we educated on insulin now that she has Humalog and Basaglar in the home.  Written instructions also made and left in bags with insulin in the refrigerator. She felt comfortable with these and could tell me what to take and when.   Refills as  noted: Folic Acid Ferrous Sulfate Losartan   I dropped of prescriptions to [facility] today at 1715.   Home visit complete. I will plan to see her in one week. She agreed and knows to reach out in the mean time if needed.   [name], [place] 01/30/2022    Patient Care Team: Seward Carol, MD as PCP - General (Internal Medicine) Constance Haw, MD as PCP - Electrophysiology (Cardiology) Sueanne Margarita, MD as PCP - Sleep Medicine (Cardiology) Bensimhon, Shaune Pascal, MD as PCP - Cardiology (Cardiology) Jorge Ny, LCSW as Social Worker (Licensed Clinical Social Worker) Shamleffer, Melanie Crazier, MD as Consulting Physician (Endocrinology)  Patient Active Problem List   Diagnosis Date Noted   Prosthetic joint implant failure, initial encounter [facility]) 12/20/2021   Pseudophakia of both eyes 11/15/2021   DVT (deep venous thrombosis) ([place]) 07/31/2021   Acute blood loss anemia    Septic Arthritis, Infection of prosthetic right knee joint ([place]), MSSA infection 09/47/0962   Complication of internal right knee prosthesis ([place]) 07/10/2021   CAD (coronary artery disease) 06/08/2021   Carotid artery disease ([place]) 06/08/2021   Symptomatic bradycardia 06/08/2021   Paroxysmal atrial fibrillation (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, with long-term current use of insulin ([place]) 05/14/2019   Type 2 diabetes  mellitus with diabetic polyneuropathy, with long-term current use of insulin ([place]) 05/14/2019   History of gout 05/20/2018   Primary localized osteoarthritis of right knee 05/13/2018   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) 12/20/2017   Obesity (BMI 30-39.9) 11/11/2017   OSA (obstructive sleep apnea) 02/18/2017   Essential hypertension    L Breast Cancer    Hypothyroidism     Current Outpatient Medications:    acetaminophen (TYLENOL) 500 MG tablet, Take 500-1,000 mg by mouth every 6 (six) hours as needed (for pain or headaches)., Disp: , Rfl:    allopurinol (ZYLOPRIM) 100 MG tablet, Take 100 mg by mouth daily., Disp: , Rfl:    apixaban (ELIQUIS) 5 MG TABS tablet, Take 1 tablet (5 mg total) by mouth 2 (two) times daily., Disp: 60 tablet, Rfl: 11   atorvastatin (LIPITOR) 80 MG tablet, Take 1 tablet (80 mg total) by mouth at bedtime., Disp: 90 tablet, Rfl: 3   Biotin 10 MG TABS, Take 10 mg by mouth daily., Disp: , Rfl:    Blood Glucose Monitoring Suppl (ONETOUCH VERIO) w/Device KIT, Use to check blood sugar, Disp: 1 kit, Rfl: 0   carvedilol (COREG) 3.125 MG tablet, Take 1 tablet (3.125 mg total) by mouth 2 (two) times daily with a meal., Disp: 60 tablet, Rfl: 6   clotrimazole-betamethasone (LOTRISONE) lotion, Apply 1 application topically 2 (two) times daily as needed (itching)., Disp: , Rfl:    Continuous Blood Gluc Sensor (DEXCOM G6 SENSOR) MISC, 1 Device by Does not apply route as directed., Disp: 9 each, Rfl: 3   Continuous Blood Gluc Transmit (DEXCOM G6 TRANSMITTER) MISC, 1 Device by Does not apply route as directed., Disp: 1 each, Rfl: 3   empagliflozin (JARDIANCE) 10 MG TABS tablet, Take 10 mg by mouth daily., Disp: , Rfl:    Ferrous Sulfate (IRON) 325 (65 Fe) MG TABS, Take 1 tablet (325 mg total) by mouth 2 (two) times daily before a meal., Disp: , Rfl:    folic acid (FOLVITE) 1 MG tablet, Take 1 tablet (1 mg total) by mouth daily., Disp: 30 tablet, Rfl: 2   gabapentin  (NEURONTIN) 300 MG capsule, Take 300 mg by mouth 2 (two) times daily., Disp: , Rfl:    Insulin Glargine (BASAGLAR KWIKPEN) 100 UNIT/ML, Inject 45 Units into the skin at bedtime., Disp: 45 mL, Rfl: 0   insulin lispro (HUMALOG) 100 UNIT/ML injection, Inject 0.05 mLs (5 Units total) into the skin 3 (three) times daily before meals., Disp: 10 mL, Rfl: 0   latanoprost (XALATAN) 0.005 % ophthalmic solution, Place 1 drop into both eyes at bedtime., Disp: , Rfl:    levothyroxine (SYNTHROID, LEVOTHROID) 75 MCG tablet, Take 75 mcg by mouth daily before breakfast., Disp: , Rfl:    losartan (COZAAR) 25 MG tablet, Take 1 tablet (25 mg total) by mouth daily., Disp: 30 tablet, Rfl: 6   LUMIGAN 0.01 % SOLN, Place 1 drop into both eyes at bedtime., Disp: , Rfl:    Multiple Vitamins-Minerals (ALIVE ONCE DAILY WOMENS 50+ PO), Take 1 tablet by mouth  daily., Disp: , Rfl:    spironolactone (ALDACTONE) 25 MG tablet, Take 0.5 tablets (12.5 mg total) by mouth daily., Disp: 90 tablet, Rfl: 0   torsemide (DEMADEX) 20 MG tablet, Take 1 tablet (20 mg total) by mouth as needed. (Patient taking differently: Take 20 mg by mouth daily as needed (edema).), Disp: 30 tablet, Rfl: 11   Travoprost, BAK Free, (TRAVATAN) 0.004 % SOLN ophthalmic solution, Place 1 drop into both eyes at bedtime., Disp: , Rfl:  Allergies  Allergen Reactions   Other Other (See Comments)    Blood Product Refusal (Jehovah's witness)     Tape Other (See Comments)    Leaves blisters and marks on the skin   Sulfa Antibiotics Rash   Uloric [Febuxostat] Rash     Social History   Socioeconomic History   Marital status: Widowed    Spouse name: Not on file   Number of children: 3   Years of education: Not on file   Highest education level: Some college, no degree  Occupational History   Occupation: retired  Tobacco Use   Smoking status: Former    Packs/day: 1.00    Years: 22.00    Total pack years: 22.00    Types: Cigarettes    Quit date: 1983     Years since quitting: 40.7   Smokeless tobacco: Never  Vaping Use   Vaping Use: Never used  Substance and Sexual Activity   Alcohol use: Yes    Comment: 05/22/2018 "glass of wine couple times/wk"   Drug use: Not Currently   Sexual activity: Not Currently    Birth control/protection: None, Post-menopausal  Other Topics Concern   Not on file  Social History Narrative   Not on file   Social Determinants of Health   Financial Resource Strain: Medium Risk (12/22/2021)   Overall Financial Resource Strain (CARDIA)    Difficulty of Paying Living Expenses: Somewhat hard  Food Insecurity: No Food Insecurity (05/08/2018)   Hunger Vital Sign    Worried About Running Out of Food in the Last Year: Never true    [place] in the Last Year: Never true  Transportation Needs: No Transportation Needs (05/08/2018)   PRAPARE - Hydrologist (Medical): No    Lack of Transportation (Non-Medical): No  Physical Activity: Not on file  Stress: Not on file  Social Connections: Not on file  Intimate Partner Violence: Not on file    Physical Exam      Future Appointments  Date Time Provider [place]  01/31/2022  7:00 AM [facility] DEVICE REMOTES [facility]  02/05/2022  1:15 PM Benay Pike, MD [facility] None  02/21/2022 11:00 AM Bensimhon, Shaune Pascal, MD [facility] None  04/27/2022  9:10 AM Shamleffer, Melanie Crazier, MD [facility] None  05/02/2022  7:00 AM [facility] DEVICE REMOTES [facility]     ACTION: Home visit completed

## 2022-01-31 ENCOUNTER — Ambulatory Visit (INDEPENDENT_AMBULATORY_CARE_PROVIDER_SITE_OTHER): Payer: Medicare HMO

## 2022-01-31 ENCOUNTER — Other Ambulatory Visit (HOSPITAL_COMMUNITY): Payer: Self-pay

## 2022-01-31 DIAGNOSIS — Z993 Dependence on wheelchair: Secondary | ICD-10-CM | POA: Diagnosis not present

## 2022-01-31 DIAGNOSIS — Z96651 Presence of right artificial knee joint: Secondary | ICD-10-CM | POA: Diagnosis not present

## 2022-01-31 DIAGNOSIS — I495 Sick sinus syndrome: Secondary | ICD-10-CM

## 2022-01-31 DIAGNOSIS — Z9181 History of falling: Secondary | ICD-10-CM | POA: Diagnosis not present

## 2022-01-31 DIAGNOSIS — G4733 Obstructive sleep apnea (adult) (pediatric): Secondary | ICD-10-CM | POA: Diagnosis not present

## 2022-01-31 DIAGNOSIS — I2510 Atherosclerotic heart disease of native coronary artery without angina pectoris: Secondary | ICD-10-CM | POA: Diagnosis not present

## 2022-01-31 DIAGNOSIS — Z950 Presence of cardiac pacemaker: Secondary | ICD-10-CM | POA: Diagnosis not present

## 2022-01-31 DIAGNOSIS — E1142 Type 2 diabetes mellitus with diabetic polyneuropathy: Secondary | ICD-10-CM | POA: Diagnosis not present

## 2022-01-31 DIAGNOSIS — N183 Chronic kidney disease, stage 3 unspecified: Secondary | ICD-10-CM | POA: Diagnosis not present

## 2022-01-31 DIAGNOSIS — I480 Paroxysmal atrial fibrillation: Secondary | ICD-10-CM | POA: Diagnosis not present

## 2022-01-31 DIAGNOSIS — E1122 Type 2 diabetes mellitus with diabetic chronic kidney disease: Secondary | ICD-10-CM | POA: Diagnosis not present

## 2022-01-31 DIAGNOSIS — Z794 Long term (current) use of insulin: Secondary | ICD-10-CM | POA: Diagnosis not present

## 2022-01-31 DIAGNOSIS — Z471 Aftercare following joint replacement surgery: Secondary | ICD-10-CM | POA: Diagnosis not present

## 2022-01-31 DIAGNOSIS — Z853 Personal history of malignant neoplasm of breast: Secondary | ICD-10-CM | POA: Diagnosis not present

## 2022-01-31 DIAGNOSIS — I5042 Chronic combined systolic (congestive) and diastolic (congestive) heart failure: Secondary | ICD-10-CM | POA: Diagnosis not present

## 2022-01-31 DIAGNOSIS — T84092D Other mechanical complication of internal right knee prosthesis, subsequent encounter: Secondary | ICD-10-CM | POA: Diagnosis not present

## 2022-01-31 DIAGNOSIS — E039 Hypothyroidism, unspecified: Secondary | ICD-10-CM | POA: Diagnosis not present

## 2022-01-31 DIAGNOSIS — M1990 Unspecified osteoarthritis, unspecified site: Secondary | ICD-10-CM | POA: Diagnosis not present

## 2022-01-31 DIAGNOSIS — I110 Hypertensive heart disease with heart failure: Secondary | ICD-10-CM | POA: Diagnosis not present

## 2022-01-31 DIAGNOSIS — Z86718 Personal history of other venous thrombosis and embolism: Secondary | ICD-10-CM | POA: Diagnosis not present

## 2022-01-31 DIAGNOSIS — E113413 Type 2 diabetes mellitus with severe nonproliferative diabetic retinopathy with macular edema, bilateral: Secondary | ICD-10-CM | POA: Diagnosis not present

## 2022-01-31 MED ORDER — LOSARTAN POTASSIUM 25 MG PO TABS: 25.0000 mg | ORAL_TABLET | Freq: Every day | ORAL | 6 refills | Status: DC

## 2022-01-31 MED ORDER — FOLIC ACID 1 MG PO TABS: 1.0000 mg | ORAL_TABLET | Freq: Every day | ORAL | 2 refills | Status: DC

## 2022-01-31 MED ORDER — IRON 325 (65 FE) MG PO TABS: 1.0000 | ORAL_TABLET | Freq: Two times a day (BID) | ORAL | Status: DC

## 2022-01-31 NOTE — Telephone Encounter (Signed)
Sent medication refills to [place] per paramedicine request. Folic acid, iron supplement, losartan. Sent myChart message to patient. [name], community paramedicine notified.

## 2022-02-01 DIAGNOSIS — E113413 Type 2 diabetes mellitus with severe nonproliferative diabetic retinopathy with macular edema, bilateral: Secondary | ICD-10-CM | POA: Diagnosis not present

## 2022-02-01 DIAGNOSIS — Z853 Personal history of malignant neoplasm of breast: Secondary | ICD-10-CM | POA: Diagnosis not present

## 2022-02-01 DIAGNOSIS — I739 Peripheral vascular disease, unspecified: Secondary | ICD-10-CM | POA: Diagnosis not present

## 2022-02-01 DIAGNOSIS — I5022 Chronic systolic (congestive) heart failure: Secondary | ICD-10-CM | POA: Diagnosis not present

## 2022-02-01 DIAGNOSIS — I480 Paroxysmal atrial fibrillation: Secondary | ICD-10-CM | POA: Diagnosis not present

## 2022-02-01 DIAGNOSIS — N1832 Chronic kidney disease, stage 3b: Secondary | ICD-10-CM | POA: Diagnosis not present

## 2022-02-01 DIAGNOSIS — I2510 Atherosclerotic heart disease of native coronary artery without angina pectoris: Secondary | ICD-10-CM | POA: Diagnosis not present

## 2022-02-01 DIAGNOSIS — I10 Essential (primary) hypertension: Secondary | ICD-10-CM | POA: Diagnosis not present

## 2022-02-01 DIAGNOSIS — D638 Anemia in other chronic diseases classified elsewhere: Secondary | ICD-10-CM | POA: Diagnosis not present

## 2022-02-01 DIAGNOSIS — D649 Anemia, unspecified: Secondary | ICD-10-CM | POA: Diagnosis not present

## 2022-02-01 DIAGNOSIS — Z96659 Presence of unspecified artificial knee joint: Secondary | ICD-10-CM | POA: Diagnosis not present

## 2022-02-01 LAB — CUP PACEART REMOTE DEVICE CHECK
Battery Remaining Longevity: 43 mo
Battery Voltage: 2.97 V
Brady Statistic AP VP Percent: 0 %
Brady Statistic AP VS Percent: 0.08 %
Brady Statistic AS VP Percent: 0.03 %
Brady Statistic AS VS Percent: 99.88 %
Brady Statistic RA Percent Paced: 0.08 %
Brady Statistic RV Percent Paced: 0.04 %
Date Time Interrogation Session: 20230906113342
Implantable Lead Implant Date: 20160222
Implantable Lead Implant Date: 20160222
Implantable Lead Location: 753859
Implantable Lead Location: 753860
Implantable Lead Model: 5076
Implantable Lead Model: 5076
Implantable Pulse Generator Implant Date: 20160222
Lead Channel Impedance Value: 342 Ohm
Lead Channel Impedance Value: 342 Ohm
Lead Channel Impedance Value: 399 Ohm
Lead Channel Impedance Value: 437 Ohm
Lead Channel Pacing Threshold Amplitude: 0.75 V
Lead Channel Pacing Threshold Amplitude: 0.875 V
Lead Channel Pacing Threshold Pulse Width: 0.4 ms
Lead Channel Pacing Threshold Pulse Width: 0.4 ms
Lead Channel Sensing Intrinsic Amplitude: 14.25 mV
Lead Channel Sensing Intrinsic Amplitude: 14.25 mV
Lead Channel Sensing Intrinsic Amplitude: 2 mV
Lead Channel Sensing Intrinsic Amplitude: 2 mV
Lead Channel Setting Pacing Amplitude: 2 V
Lead Channel Setting Pacing Amplitude: 2 V
Lead Channel Setting Pacing Pulse Width: 0.4 ms
Lead Channel Setting Sensing Sensitivity: 2 mV

## 2022-02-02 ENCOUNTER — Ambulatory Visit: Payer: Medicare HMO | Admitting: Hematology and Oncology

## 2022-02-02 DIAGNOSIS — Z853 Personal history of malignant neoplasm of breast: Secondary | ICD-10-CM | POA: Diagnosis not present

## 2022-02-02 DIAGNOSIS — T84092D Other mechanical complication of internal right knee prosthesis, subsequent encounter: Secondary | ICD-10-CM | POA: Diagnosis not present

## 2022-02-02 DIAGNOSIS — Z993 Dependence on wheelchair: Secondary | ICD-10-CM | POA: Diagnosis not present

## 2022-02-02 DIAGNOSIS — E113413 Type 2 diabetes mellitus with severe nonproliferative diabetic retinopathy with macular edema, bilateral: Secondary | ICD-10-CM | POA: Diagnosis not present

## 2022-02-02 DIAGNOSIS — I2510 Atherosclerotic heart disease of native coronary artery without angina pectoris: Secondary | ICD-10-CM | POA: Diagnosis not present

## 2022-02-02 DIAGNOSIS — Z96651 Presence of right artificial knee joint: Secondary | ICD-10-CM | POA: Diagnosis not present

## 2022-02-02 DIAGNOSIS — G4733 Obstructive sleep apnea (adult) (pediatric): Secondary | ICD-10-CM | POA: Diagnosis not present

## 2022-02-02 DIAGNOSIS — I110 Hypertensive heart disease with heart failure: Secondary | ICD-10-CM | POA: Diagnosis not present

## 2022-02-02 DIAGNOSIS — E1142 Type 2 diabetes mellitus with diabetic polyneuropathy: Secondary | ICD-10-CM | POA: Diagnosis not present

## 2022-02-02 DIAGNOSIS — Z794 Long term (current) use of insulin: Secondary | ICD-10-CM | POA: Diagnosis not present

## 2022-02-02 DIAGNOSIS — I5042 Chronic combined systolic (congestive) and diastolic (congestive) heart failure: Secondary | ICD-10-CM | POA: Diagnosis not present

## 2022-02-02 DIAGNOSIS — N183 Chronic kidney disease, stage 3 unspecified: Secondary | ICD-10-CM | POA: Diagnosis not present

## 2022-02-02 DIAGNOSIS — Z86718 Personal history of other venous thrombosis and embolism: Secondary | ICD-10-CM | POA: Diagnosis not present

## 2022-02-02 DIAGNOSIS — Z950 Presence of cardiac pacemaker: Secondary | ICD-10-CM | POA: Diagnosis not present

## 2022-02-02 DIAGNOSIS — M1990 Unspecified osteoarthritis, unspecified site: Secondary | ICD-10-CM | POA: Diagnosis not present

## 2022-02-02 DIAGNOSIS — I480 Paroxysmal atrial fibrillation: Secondary | ICD-10-CM | POA: Diagnosis not present

## 2022-02-02 DIAGNOSIS — E1122 Type 2 diabetes mellitus with diabetic chronic kidney disease: Secondary | ICD-10-CM | POA: Diagnosis not present

## 2022-02-02 DIAGNOSIS — E039 Hypothyroidism, unspecified: Secondary | ICD-10-CM | POA: Diagnosis not present

## 2022-02-02 DIAGNOSIS — Z9181 History of falling: Secondary | ICD-10-CM | POA: Diagnosis not present

## 2022-02-05 ENCOUNTER — Inpatient Hospital Stay: Payer: Medicare HMO | Attending: Hematology and Oncology | Admitting: Hematology and Oncology

## 2022-02-05 DIAGNOSIS — Z993 Dependence on wheelchair: Secondary | ICD-10-CM | POA: Diagnosis not present

## 2022-02-05 DIAGNOSIS — E1122 Type 2 diabetes mellitus with diabetic chronic kidney disease: Secondary | ICD-10-CM | POA: Diagnosis not present

## 2022-02-05 DIAGNOSIS — N183 Chronic kidney disease, stage 3 unspecified: Secondary | ICD-10-CM | POA: Diagnosis not present

## 2022-02-05 DIAGNOSIS — T84092D Other mechanical complication of internal right knee prosthesis, subsequent encounter: Secondary | ICD-10-CM | POA: Diagnosis not present

## 2022-02-05 DIAGNOSIS — G4733 Obstructive sleep apnea (adult) (pediatric): Secondary | ICD-10-CM | POA: Diagnosis not present

## 2022-02-05 DIAGNOSIS — Z853 Personal history of malignant neoplasm of breast: Secondary | ICD-10-CM | POA: Diagnosis not present

## 2022-02-05 DIAGNOSIS — E1142 Type 2 diabetes mellitus with diabetic polyneuropathy: Secondary | ICD-10-CM | POA: Diagnosis not present

## 2022-02-05 DIAGNOSIS — Z9181 History of falling: Secondary | ICD-10-CM | POA: Diagnosis not present

## 2022-02-05 DIAGNOSIS — Z794 Long term (current) use of insulin: Secondary | ICD-10-CM | POA: Diagnosis not present

## 2022-02-05 DIAGNOSIS — E113413 Type 2 diabetes mellitus with severe nonproliferative diabetic retinopathy with macular edema, bilateral: Secondary | ICD-10-CM | POA: Diagnosis not present

## 2022-02-05 DIAGNOSIS — E039 Hypothyroidism, unspecified: Secondary | ICD-10-CM | POA: Diagnosis not present

## 2022-02-05 DIAGNOSIS — I480 Paroxysmal atrial fibrillation: Secondary | ICD-10-CM | POA: Diagnosis not present

## 2022-02-05 DIAGNOSIS — I2510 Atherosclerotic heart disease of native coronary artery without angina pectoris: Secondary | ICD-10-CM | POA: Diagnosis not present

## 2022-02-05 DIAGNOSIS — M1990 Unspecified osteoarthritis, unspecified site: Secondary | ICD-10-CM | POA: Diagnosis not present

## 2022-02-05 DIAGNOSIS — Z86718 Personal history of other venous thrombosis and embolism: Secondary | ICD-10-CM | POA: Diagnosis not present

## 2022-02-05 DIAGNOSIS — Z96651 Presence of right artificial knee joint: Secondary | ICD-10-CM | POA: Diagnosis not present

## 2022-02-05 DIAGNOSIS — I110 Hypertensive heart disease with heart failure: Secondary | ICD-10-CM | POA: Diagnosis not present

## 2022-02-05 DIAGNOSIS — Z950 Presence of cardiac pacemaker: Secondary | ICD-10-CM | POA: Diagnosis not present

## 2022-02-05 DIAGNOSIS — I5042 Chronic combined systolic (congestive) and diastolic (congestive) heart failure: Secondary | ICD-10-CM | POA: Diagnosis not present

## 2022-02-06 ENCOUNTER — Other Ambulatory Visit (HOSPITAL_COMMUNITY): Payer: Self-pay

## 2022-02-06 DIAGNOSIS — Z9181 History of falling: Secondary | ICD-10-CM | POA: Diagnosis not present

## 2022-02-06 DIAGNOSIS — Z96651 Presence of right artificial knee joint: Secondary | ICD-10-CM | POA: Diagnosis not present

## 2022-02-06 DIAGNOSIS — I110 Hypertensive heart disease with heart failure: Secondary | ICD-10-CM | POA: Diagnosis not present

## 2022-02-06 DIAGNOSIS — E039 Hypothyroidism, unspecified: Secondary | ICD-10-CM | POA: Diagnosis not present

## 2022-02-06 DIAGNOSIS — G4733 Obstructive sleep apnea (adult) (pediatric): Secondary | ICD-10-CM | POA: Diagnosis not present

## 2022-02-06 DIAGNOSIS — T84092D Other mechanical complication of internal right knee prosthesis, subsequent encounter: Secondary | ICD-10-CM | POA: Diagnosis not present

## 2022-02-06 DIAGNOSIS — Z950 Presence of cardiac pacemaker: Secondary | ICD-10-CM | POA: Diagnosis not present

## 2022-02-06 DIAGNOSIS — I2510 Atherosclerotic heart disease of native coronary artery without angina pectoris: Secondary | ICD-10-CM | POA: Diagnosis not present

## 2022-02-06 DIAGNOSIS — Z794 Long term (current) use of insulin: Secondary | ICD-10-CM | POA: Diagnosis not present

## 2022-02-06 DIAGNOSIS — Z853 Personal history of malignant neoplasm of breast: Secondary | ICD-10-CM | POA: Diagnosis not present

## 2022-02-06 DIAGNOSIS — Z993 Dependence on wheelchair: Secondary | ICD-10-CM | POA: Diagnosis not present

## 2022-02-06 DIAGNOSIS — E113413 Type 2 diabetes mellitus with severe nonproliferative diabetic retinopathy with macular edema, bilateral: Secondary | ICD-10-CM | POA: Diagnosis not present

## 2022-02-06 DIAGNOSIS — I5042 Chronic combined systolic (congestive) and diastolic (congestive) heart failure: Secondary | ICD-10-CM | POA: Diagnosis not present

## 2022-02-06 DIAGNOSIS — E1122 Type 2 diabetes mellitus with diabetic chronic kidney disease: Secondary | ICD-10-CM | POA: Diagnosis not present

## 2022-02-06 DIAGNOSIS — Z86718 Personal history of other venous thrombosis and embolism: Secondary | ICD-10-CM | POA: Diagnosis not present

## 2022-02-06 DIAGNOSIS — N183 Chronic kidney disease, stage 3 unspecified: Secondary | ICD-10-CM | POA: Diagnosis not present

## 2022-02-06 DIAGNOSIS — I480 Paroxysmal atrial fibrillation: Secondary | ICD-10-CM | POA: Diagnosis not present

## 2022-02-06 DIAGNOSIS — M1990 Unspecified osteoarthritis, unspecified site: Secondary | ICD-10-CM | POA: Diagnosis not present

## 2022-02-06 DIAGNOSIS — E1142 Type 2 diabetes mellitus with diabetic polyneuropathy: Secondary | ICD-10-CM | POA: Diagnosis not present

## 2022-02-06 NOTE — Progress Notes (Signed)
Paramedicine Encounter    Patient ID: Danielle [name], female    DOB: 02/20/1946, 76 y.o.   MRN: [id number]   Arrived for home visit for Danielle [name] who reports feeling okay but fatigued. She reports her sugar dropped earlier and she also had physical therapy so it caused her to get really tired. She reports eating a meal and taking a nap and feeling better now. CBG up to 160 now. She reports no weight gain, no swelling. No chest pain, dizziness or shortness of breath. She reports being compliant with her meds. Pill box empty upon med check. She took all medications over the last week. I reviewed meds and confirmed same. I filled pill box for one week. (Adding antibiotic and antiinflammatory from ortho MD into pill box.)  Danielle [name] reports she never received the Eliquis from [facility] like she thought- it was another package, she mistook it for. We called leasing office and they deny having any package delivered for her on the date [facility] reports it was delivered. No sign of this package in her home. I called [facility] and they report they are going to verify they can ship out another shipment of her Eliquis. Her daughter picked up a 30 days supply at [facility] for $150 but reports they cannot pay for it again. Hoping [facility] will be able to send out shipment within the next week or two. Danielle [name] aware and agreeable.   Appointments reviewed and confirmed.   Refills called in: Folic Acid- Pill box missing Folic Acid (sat, sun, mon, tues)  Celebrex   I will plan to see Danielle [name] in one week. Home visit complete.   Danielle [name], [place] 02/06/2022      Patient Care Team: Danielle Carol, MD as PCP - General (Internal Medicine) Danielle Haw, MD as PCP - Electrophysiology (Cardiology) Danielle Margarita, MD as PCP - Sleep Medicine (Cardiology) [name], Danielle Pascal, MD as PCP - Cardiology (Cardiology) Danielle Ny, LCSW as Social Worker (Licensed Clinical Social Worker) [name], Danielle Crazier,  MD as Consulting Physician (Endocrinology)  Patient Active Problem List   Diagnosis Date Noted   Prosthetic joint implant failure, initial encounter [facility]) 12/20/2021   Pseudophakia of both eyes 11/15/2021   DVT (deep venous thrombosis) ([place]) 07/31/2021   Acute blood loss anemia    Septic Arthritis, Infection of prosthetic right knee joint ([place]), MSSA infection 03/21/8526   Complication of internal right knee prosthesis ([place]) 07/10/2021   CAD (coronary artery disease) 06/08/2021   Carotid artery disease ([place]) 06/08/2021   Symptomatic bradycardia 06/08/2021   Paroxysmal atrial fibrillation ([place]) 06/08/2021   CKD (chronic kidney disease), stage III ([place]) 06/08/2021   Polyneuropathy associated with underlying disease ([place]) 05/04/2020   Severe nonproliferative diabetic retinopathy of left eye, with macular edema, associated with type 2 diabetes mellitus ([place]) 11/05/2019   Severe nonproliferative diabetic retinopathy of right eye, with macular edema, associated with type 2 diabetes mellitus ([place]) 11/05/2019   Retinal hemorrhage of right eye 11/05/2019   Retinal hemorrhage of left eye 11/05/2019   Diabetes mellitus ([place]) 05/14/2019   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, with long-term current use of insulin ([place]) 05/14/2019   Type 2 diabetes mellitus with diabetic polyneuropathy, with long-term current use of insulin ([place]) 05/14/2019   History of gout 05/20/2018   Primary localized osteoarthritis of right knee 05/13/2018   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) 12/20/2017   Obesity (BMI 30-39.9) 11/11/2017   OSA (obstructive sleep apnea) 02/18/2017   Essential hypertension  L Breast Cancer    Hypothyroidism     Current Outpatient Medications:    acetaminophen (TYLENOL) 500 MG tablet, Take 500-1,000 mg by mouth every 6 (six) hours as needed (for pain or headaches)., Disp: , Rfl:    allopurinol (ZYLOPRIM) 100 MG tablet, Take 100 mg by mouth daily., Disp: , Rfl:     apixaban (ELIQUIS) 5 MG TABS tablet, Take 1 tablet (5 mg total) by mouth 2 (two) times daily., Disp: 60 tablet, Rfl: 11   atorvastatin (LIPITOR) 80 MG tablet, Take 1 tablet (80 mg total) by mouth at bedtime., Disp: 90 tablet, Rfl: 3   Biotin 10 MG TABS, Take 10 mg by mouth daily., Disp: , Rfl:    Blood Glucose Monitoring Suppl (ONETOUCH VERIO) w/Device KIT, Use to check blood sugar, Disp: 1 kit, Rfl: 0   carvedilol (COREG) 3.125 MG tablet, Take 1 tablet (3.125 mg total) by mouth 2 (two) times daily with a meal., Disp: 60 tablet, Rfl: 6   clotrimazole-betamethasone (LOTRISONE) lotion, Apply 1 application topically 2 (two) times daily as needed (itching)., Disp: , Rfl:    Continuous Blood Gluc Sensor (DEXCOM G6 SENSOR) MISC, 1 Device by Does not apply route as directed., Disp: 9 each, Rfl: 3   Continuous Blood Gluc Transmit (DEXCOM G6 TRANSMITTER) MISC, 1 Device by Does not apply route as directed., Disp: 1 each, Rfl: 3   empagliflozin (JARDIANCE) 10 MG TABS tablet, Take 10 mg by mouth daily., Disp: , Rfl:    Ferrous Sulfate (IRON) 325 (65 Fe) MG TABS, Take 1 tablet (325 mg total) by mouth 2 (two) times daily before a meal., Disp: 30 tablet, Rfl:    folic acid (FOLVITE) 1 MG tablet, Take 1 tablet (1 mg total) by mouth daily., Disp: 30 tablet, Rfl: 2   gabapentin (NEURONTIN) 300 MG capsule, Take 300 mg by mouth 2 (two) times daily., Disp: , Rfl:    Insulin Glargine (BASAGLAR KWIKPEN) 100 UNIT/ML, Inject 45 Units into the skin at bedtime., Disp: 45 mL, Rfl: 0   insulin lispro (HUMALOG) 100 UNIT/ML injection, Inject 0.05 mLs (5 Units total) into the skin 3 (three) times daily before meals., Disp: 10 mL, Rfl: 0   latanoprost (XALATAN) 0.005 % ophthalmic solution, Place 1 drop into both eyes at bedtime., Disp: , Rfl:    levothyroxine (SYNTHROID, LEVOTHROID) 75 MCG tablet, Take 75 mcg by mouth daily before breakfast., Disp: , Rfl:    losartan (COZAAR) 25 MG tablet, Take 1 tablet (25 mg total) by mouth  daily., Disp: 30 tablet, Rfl: 6   LUMIGAN 0.01 % SOLN, Place 1 drop into both eyes at bedtime., Disp: , Rfl:    Multiple Vitamins-Minerals (ALIVE ONCE DAILY WOMENS 50+ PO), Take 1 tablet by mouth daily., Disp: , Rfl:    spironolactone (ALDACTONE) 25 MG tablet, Take 0.5 tablets (12.5 mg total) by mouth daily., Disp: 90 tablet, Rfl: 0   torsemide (DEMADEX) 20 MG tablet, Take 1 tablet (20 mg total) by mouth as needed. (Patient taking differently: Take 20 mg by mouth daily as needed (edema).), Disp: 30 tablet, Rfl: 11   Travoprost, BAK Free, (TRAVATAN) 0.004 % SOLN ophthalmic solution, Place 1 drop into both eyes at bedtime., Disp: , Rfl:  Allergies  Allergen Reactions   Other Other (See Comments)    Blood Product Refusal (Jehovah's witness)     Tape Other (See Comments)    Leaves blisters and marks on the skin   Sulfa Antibiotics Rash   Uloric [Febuxostat] Rash  Social History   Socioeconomic History   Marital status: Widowed    Spouse name: Not on file   Number of children: 3   Years of education: Not on file   Highest education level: Some college, no degree  Occupational History   Occupation: retired  Tobacco Use   Smoking status: Former    Packs/day: 1.00    Years: 22.00    Total pack years: 22.00    Types: Cigarettes    Quit date: 1983    Years since quitting: 40.7   Smokeless tobacco: Never  Vaping Use   Vaping Use: Never used  Substance and Sexual Activity   Alcohol use: Yes    Comment: 05/22/2018 "glass of wine couple times/wk"   Drug use: Not Currently   Sexual activity: Not Currently    Birth control/protection: None, Post-menopausal  Other Topics Concern   Not on file  Social History Narrative   Not on file   Social Determinants of Health   Financial Resource Strain: Medium Risk (12/22/2021)   Overall Financial Resource Strain (CARDIA)    Difficulty of Paying Living Expenses: Somewhat hard  Food Insecurity: No Food Insecurity (05/08/2018)   Hunger  Vital Sign    Worried About Running Out of Food in the Last Year: Never true    [place] in the Last Year: Never true  Transportation Needs: No Transportation Needs (05/08/2018)   PRAPARE - Hydrologist (Medical): No    Lack of Transportation (Non-Medical): No  Physical Activity: Not on file  Stress: Not on file  Social Connections: Not on file  Intimate Partner Violence: Not on file    Physical Exam      Future Appointments  Date Time Provider [place]  02/21/2022 11:00 AM [name], Danielle Pascal, MD [facility] None  04/27/2022  9:10 AM [name], Danielle Crazier, MD [facility] None  05/02/2022  7:00 AM [facility] DEVICE REMOTES [facility]     ACTION: Home visit completed

## 2022-02-07 ENCOUNTER — Telehealth (HOSPITAL_COMMUNITY): Payer: Self-pay | Admitting: Licensed Clinical Social Worker

## 2022-02-07 DIAGNOSIS — T84092D Other mechanical complication of internal right knee prosthesis, subsequent encounter: Secondary | ICD-10-CM | POA: Diagnosis not present

## 2022-02-07 DIAGNOSIS — I5042 Chronic combined systolic (congestive) and diastolic (congestive) heart failure: Secondary | ICD-10-CM | POA: Diagnosis not present

## 2022-02-07 DIAGNOSIS — Z853 Personal history of malignant neoplasm of breast: Secondary | ICD-10-CM | POA: Diagnosis not present

## 2022-02-07 DIAGNOSIS — I2510 Atherosclerotic heart disease of native coronary artery without angina pectoris: Secondary | ICD-10-CM | POA: Diagnosis not present

## 2022-02-07 DIAGNOSIS — Z950 Presence of cardiac pacemaker: Secondary | ICD-10-CM | POA: Diagnosis not present

## 2022-02-07 DIAGNOSIS — Z993 Dependence on wheelchair: Secondary | ICD-10-CM | POA: Diagnosis not present

## 2022-02-07 DIAGNOSIS — M1990 Unspecified osteoarthritis, unspecified site: Secondary | ICD-10-CM | POA: Diagnosis not present

## 2022-02-07 DIAGNOSIS — E1142 Type 2 diabetes mellitus with diabetic polyneuropathy: Secondary | ICD-10-CM | POA: Diagnosis not present

## 2022-02-07 DIAGNOSIS — I110 Hypertensive heart disease with heart failure: Secondary | ICD-10-CM | POA: Diagnosis not present

## 2022-02-07 DIAGNOSIS — Z9181 History of falling: Secondary | ICD-10-CM | POA: Diagnosis not present

## 2022-02-07 DIAGNOSIS — Z86718 Personal history of other venous thrombosis and embolism: Secondary | ICD-10-CM | POA: Diagnosis not present

## 2022-02-07 DIAGNOSIS — E1122 Type 2 diabetes mellitus with diabetic chronic kidney disease: Secondary | ICD-10-CM | POA: Diagnosis not present

## 2022-02-07 DIAGNOSIS — E113413 Type 2 diabetes mellitus with severe nonproliferative diabetic retinopathy with macular edema, bilateral: Secondary | ICD-10-CM | POA: Diagnosis not present

## 2022-02-07 DIAGNOSIS — Z794 Long term (current) use of insulin: Secondary | ICD-10-CM | POA: Diagnosis not present

## 2022-02-07 DIAGNOSIS — Z96651 Presence of right artificial knee joint: Secondary | ICD-10-CM | POA: Diagnosis not present

## 2022-02-07 DIAGNOSIS — I480 Paroxysmal atrial fibrillation: Secondary | ICD-10-CM | POA: Diagnosis not present

## 2022-02-07 DIAGNOSIS — G4733 Obstructive sleep apnea (adult) (pediatric): Secondary | ICD-10-CM | POA: Diagnosis not present

## 2022-02-07 DIAGNOSIS — N183 Chronic kidney disease, stage 3 unspecified: Secondary | ICD-10-CM | POA: Diagnosis not present

## 2022-02-07 DIAGNOSIS — E039 Hypothyroidism, unspecified: Secondary | ICD-10-CM | POA: Diagnosis not present

## 2022-02-07 NOTE — Telephone Encounter (Signed)
HF Paramedicine Team Based Care Meeting  HF MD- NA  HF NP - [place] NP-C   [facility] admit within the last 30 days for heart failure? no  Medications concerns? Recently home from SNF and very confused on what she is taking  Eligible for discharge? Needs to continue to follow helping transition successfully back home  Jorge Ny, [facility] Desk#: [phone] Cell#: [phone]

## 2022-02-09 DIAGNOSIS — Z853 Personal history of malignant neoplasm of breast: Secondary | ICD-10-CM | POA: Diagnosis not present

## 2022-02-09 DIAGNOSIS — Z86718 Personal history of other venous thrombosis and embolism: Secondary | ICD-10-CM | POA: Diagnosis not present

## 2022-02-09 DIAGNOSIS — I110 Hypertensive heart disease with heart failure: Secondary | ICD-10-CM | POA: Diagnosis not present

## 2022-02-09 DIAGNOSIS — Z993 Dependence on wheelchair: Secondary | ICD-10-CM | POA: Diagnosis not present

## 2022-02-09 DIAGNOSIS — Z950 Presence of cardiac pacemaker: Secondary | ICD-10-CM | POA: Diagnosis not present

## 2022-02-09 DIAGNOSIS — Z96651 Presence of right artificial knee joint: Secondary | ICD-10-CM | POA: Diagnosis not present

## 2022-02-09 DIAGNOSIS — E113413 Type 2 diabetes mellitus with severe nonproliferative diabetic retinopathy with macular edema, bilateral: Secondary | ICD-10-CM | POA: Diagnosis not present

## 2022-02-09 DIAGNOSIS — I5042 Chronic combined systolic (congestive) and diastolic (congestive) heart failure: Secondary | ICD-10-CM | POA: Diagnosis not present

## 2022-02-09 DIAGNOSIS — T84092D Other mechanical complication of internal right knee prosthesis, subsequent encounter: Secondary | ICD-10-CM | POA: Diagnosis not present

## 2022-02-09 DIAGNOSIS — I480 Paroxysmal atrial fibrillation: Secondary | ICD-10-CM | POA: Diagnosis not present

## 2022-02-09 DIAGNOSIS — E1122 Type 2 diabetes mellitus with diabetic chronic kidney disease: Secondary | ICD-10-CM | POA: Diagnosis not present

## 2022-02-09 DIAGNOSIS — E039 Hypothyroidism, unspecified: Secondary | ICD-10-CM | POA: Diagnosis not present

## 2022-02-09 DIAGNOSIS — Z794 Long term (current) use of insulin: Secondary | ICD-10-CM | POA: Diagnosis not present

## 2022-02-09 DIAGNOSIS — E1142 Type 2 diabetes mellitus with diabetic polyneuropathy: Secondary | ICD-10-CM | POA: Diagnosis not present

## 2022-02-09 DIAGNOSIS — Z9181 History of falling: Secondary | ICD-10-CM | POA: Diagnosis not present

## 2022-02-09 DIAGNOSIS — I2510 Atherosclerotic heart disease of native coronary artery without angina pectoris: Secondary | ICD-10-CM | POA: Diagnosis not present

## 2022-02-09 DIAGNOSIS — N183 Chronic kidney disease, stage 3 unspecified: Secondary | ICD-10-CM | POA: Diagnosis not present

## 2022-02-09 DIAGNOSIS — M1990 Unspecified osteoarthritis, unspecified site: Secondary | ICD-10-CM | POA: Diagnosis not present

## 2022-02-09 DIAGNOSIS — G4733 Obstructive sleep apnea (adult) (pediatric): Secondary | ICD-10-CM | POA: Diagnosis not present

## 2022-02-10 DIAGNOSIS — E1165 Type 2 diabetes mellitus with hyperglycemia: Secondary | ICD-10-CM | POA: Diagnosis not present

## 2022-02-12 DIAGNOSIS — T84092D Other mechanical complication of internal right knee prosthesis, subsequent encounter: Secondary | ICD-10-CM | POA: Diagnosis not present

## 2022-02-12 DIAGNOSIS — Z96651 Presence of right artificial knee joint: Secondary | ICD-10-CM | POA: Diagnosis not present

## 2022-02-12 DIAGNOSIS — Z86718 Personal history of other venous thrombosis and embolism: Secondary | ICD-10-CM | POA: Diagnosis not present

## 2022-02-12 DIAGNOSIS — E039 Hypothyroidism, unspecified: Secondary | ICD-10-CM | POA: Diagnosis not present

## 2022-02-12 DIAGNOSIS — I110 Hypertensive heart disease with heart failure: Secondary | ICD-10-CM | POA: Diagnosis not present

## 2022-02-12 DIAGNOSIS — G4733 Obstructive sleep apnea (adult) (pediatric): Secondary | ICD-10-CM | POA: Diagnosis not present

## 2022-02-12 DIAGNOSIS — I480 Paroxysmal atrial fibrillation: Secondary | ICD-10-CM | POA: Diagnosis not present

## 2022-02-12 DIAGNOSIS — N183 Chronic kidney disease, stage 3 unspecified: Secondary | ICD-10-CM | POA: Diagnosis not present

## 2022-02-12 DIAGNOSIS — Z853 Personal history of malignant neoplasm of breast: Secondary | ICD-10-CM | POA: Diagnosis not present

## 2022-02-12 DIAGNOSIS — Z794 Long term (current) use of insulin: Secondary | ICD-10-CM | POA: Diagnosis not present

## 2022-02-12 DIAGNOSIS — Z993 Dependence on wheelchair: Secondary | ICD-10-CM | POA: Diagnosis not present

## 2022-02-12 DIAGNOSIS — M1990 Unspecified osteoarthritis, unspecified site: Secondary | ICD-10-CM | POA: Diagnosis not present

## 2022-02-12 DIAGNOSIS — E1142 Type 2 diabetes mellitus with diabetic polyneuropathy: Secondary | ICD-10-CM | POA: Diagnosis not present

## 2022-02-12 DIAGNOSIS — I2510 Atherosclerotic heart disease of native coronary artery without angina pectoris: Secondary | ICD-10-CM | POA: Diagnosis not present

## 2022-02-12 DIAGNOSIS — E113413 Type 2 diabetes mellitus with severe nonproliferative diabetic retinopathy with macular edema, bilateral: Secondary | ICD-10-CM | POA: Diagnosis not present

## 2022-02-12 DIAGNOSIS — I5042 Chronic combined systolic (congestive) and diastolic (congestive) heart failure: Secondary | ICD-10-CM | POA: Diagnosis not present

## 2022-02-12 DIAGNOSIS — E1122 Type 2 diabetes mellitus with diabetic chronic kidney disease: Secondary | ICD-10-CM | POA: Diagnosis not present

## 2022-02-12 DIAGNOSIS — Z9181 History of falling: Secondary | ICD-10-CM | POA: Diagnosis not present

## 2022-02-12 DIAGNOSIS — Z950 Presence of cardiac pacemaker: Secondary | ICD-10-CM | POA: Diagnosis not present

## 2022-02-13 ENCOUNTER — Other Ambulatory Visit (HOSPITAL_COMMUNITY): Payer: Self-pay

## 2022-02-13 DIAGNOSIS — I5022 Chronic systolic (congestive) heart failure: Secondary | ICD-10-CM | POA: Diagnosis not present

## 2022-02-13 DIAGNOSIS — K5900 Constipation, unspecified: Secondary | ICD-10-CM | POA: Diagnosis not present

## 2022-02-13 DIAGNOSIS — Z03818 Encounter for observation for suspected exposure to other biological agents ruled out: Secondary | ICD-10-CM | POA: Diagnosis not present

## 2022-02-13 DIAGNOSIS — J392 Other diseases of pharynx: Secondary | ICD-10-CM | POA: Diagnosis not present

## 2022-02-13 NOTE — [place] Notes (Signed)
Paramedicine Encounter    Patient ID: Danielle [name], female    DOB: 1946/03/30, 76 y.o.   MRN: [id number]    Arrived for home visit for [place] she reports a dry hacky cough with lower leg swelling X4 days. She says she is not peeing a lot and is constipated. She reports she has not taken any of her PRN Torsemide. She saw PCP today and he suggested taking her Torsemide daily to help with fluid retention. I agree with plan and so does Danielle [name]. She has a follow up with Dr. Haroldine Laws in one week.   Today lower legs +2 pitting edema. Lungs clear but dry cough noted during visit. She reports a dry sore throat. She was seen by PCP and tested for Covid and Strep last week and followed up today.  She will be completing her antibiotics (Cefadroxil 541m BID) from Ortho on Sunday evening.   I reviewed vitals- WT- 206lbs (down one lbs from last week) BP- 118/62 HR- 83 O2- 97% RR-16  CBG- 195    I reviewed meds and confirmed same. I filled pill box for one week. She received her Eliquis from [facility]. She did not pick up her Folic Acid or Celebrex from [facility]. I reminded her to pick up same. She agreed. I will plan to see her in one week in the HF clinic, she knows to reach out of needed in the mean time. I will follow up on symptoms and weight tomorrow and Thursday. She agreed with plan. Home visit complete.  Refills: -Celebrex -Folic Acid  -OTC Tylenol   HSalena Saner EMT-Paramedic [phone]/19/2023      Patient Care Team: PSeward Carol MD as PCP - General (Internal Medicine) CConstance Haw MD as PCP - Electrophysiology (Cardiology) TSueanne Margarita MD as PCP - Sleep Medicine (Cardiology) Bensimhon, DShaune Pascal MD as PCP - Cardiology (Cardiology) UJorge Ny LCSW as Social Worker (Licensed Clinical Social Worker) Shamleffer, IMelanie Crazier MD as Consulting Physician (Endocrinology)  Patient Active Problem List   Diagnosis Date Noted   Prosthetic joint implant  failure, initial encounter ([facility] 12/20/2021   Pseudophakia of both eyes 11/15/2021   DVT (deep venous thrombosis) ([facility] 07/31/2021   Acute blood loss anemia    Septic Arthritis, Infection of prosthetic right knee joint ([facility], MSSA infection 050/27/7412  Complication of internal right knee prosthesis ([facility] 07/10/2021   CAD (coronary artery disease) 06/08/2021   Carotid artery disease ([facility] 06/08/2021   Symptomatic bradycardia 06/08/2021   Paroxysmal atrial fibrillation ([facility] 06/08/2021   CKD (chronic kidney disease), stage III (HD[place] 06/08/2021   Polyneuropathy associated with underlying disease ([facility] 05/04/2020   Severe nonproliferative diabetic retinopathy of left eye, with macular edema, associated with type 2 diabetes mellitus ([facility] [place] 11/05/2019   Severe nonproliferative diabetic retinopathy of right eye, with macular edema, associated with type 2 diabetes mellitus ([facility] 11/05/2019   Retinal hemorrhage of right eye 11/05/2019   Retinal hemorrhage of left eye 11/05/2019   Diabetes mellitus ([facility] 05/14/2019   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, with long-term current use of insulin ([facility] 05/14/2019   Type 2 diabetes mellitus with diabetic polyneuropathy, with long-term current use of insulin ([facility] 05/14/2019   History of gout 05/20/2018   Primary localized osteoarthritis of right knee 05/13/2018   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) 12/20/2017   Obesity (BMI 30-39.9) 11/11/2017   OSA (obstructive sleep apnea) 02/18/2017   Essential hypertension    L Breast Cancer  Hypothyroidism     Current Outpatient Medications:    acetaminophen (TYLENOL) 500 MG tablet, Take 500-1,000 mg by mouth every 6 (six) hours as needed (for pain or headaches)., Disp: , Rfl:    allopurinol (ZYLOPRIM) 100 MG tablet, Take 100 mg by mouth daily., Disp: , Rfl:    apixaban (ELIQUIS) 5 MG TABS tablet, Take 1 tablet (5 mg total) by mouth 2 (two) times daily., Disp: 60 tablet, Rfl: 11    atorvastatin (LIPITOR) 80 MG tablet, Take 1 tablet (80 mg total) by mouth at bedtime., Disp: 90 tablet, Rfl: 3   Biotin 10 MG TABS, Take 10 mg by mouth daily., Disp: , Rfl:    Blood Glucose Monitoring Suppl (ONETOUCH VERIO) w/Device KIT, Use to check blood sugar, Disp: 1 kit, Rfl: 0   carvedilol (COREG) 3.125 MG tablet, Take 1 tablet (3.125 mg total) by mouth 2 (two) times daily with a meal., Disp: 60 tablet, Rfl: 6   clotrimazole-betamethasone (LOTRISONE) lotion, Apply 1 application topically 2 (two) times daily as needed (itching)., Disp: , Rfl:    Continuous Blood Gluc Sensor (DEXCOM G6 SENSOR) MISC, 1 Device by Does not apply route as directed., Disp: 9 each, Rfl: 3   Continuous Blood Gluc Transmit (DEXCOM G6 TRANSMITTER) MISC, 1 Device by Does not apply route as directed., Disp: 1 each, Rfl: 3   empagliflozin (JARDIANCE) 10 MG TABS tablet, Take 10 mg by mouth daily., Disp: , Rfl:    Ferrous Sulfate (IRON) 325 (65 Fe) MG TABS, Take 1 tablet (325 mg total) by mouth 2 (two) times daily before a meal., Disp: 30 tablet, Rfl:    folic acid (FOLVITE) 1 MG tablet, Take 1 tablet (1 mg total) by mouth daily., Disp: 30 tablet, Rfl: 2   gabapentin (NEURONTIN) 300 MG capsule, Take 300 mg by mouth 2 (two) times daily., Disp: , Rfl:    Insulin Glargine (BASAGLAR KWIKPEN) 100 UNIT/ML, Inject 45 Units into the skin at bedtime., Disp: 45 mL, Rfl: 0   insulin lispro (HUMALOG) 100 UNIT/ML injection, Inject 0.05 mLs (5 Units total) into the skin 3 (three) times daily before meals., Disp: 10 mL, Rfl: 0   latanoprost (XALATAN) 0.005 % ophthalmic solution, Place 1 drop into both eyes at bedtime., Disp: , Rfl:    levothyroxine (SYNTHROID, LEVOTHROID) 75 MCG tablet, Take 75 mcg by mouth daily before breakfast., Disp: , Rfl:    losartan (COZAAR) 25 MG tablet, Take 1 tablet (25 mg total) by mouth daily., Disp: 30 tablet, Rfl: 6   LUMIGAN 0.01 % SOLN, Place 1 drop into both eyes at bedtime., Disp: , Rfl:    Multiple  Vitamins-Minerals (ALIVE ONCE DAILY WOMENS 50+ PO), Take 1 tablet by mouth daily., Disp: , Rfl:    spironolactone (ALDACTONE) 25 MG tablet, Take 0.5 tablets (12.5 mg total) by mouth daily., Disp: 90 tablet, Rfl: 0   torsemide (DEMADEX) 20 MG tablet, Take 1 tablet (20 mg total) by mouth as needed. (Patient taking differently: Take 20 mg by mouth daily as needed (edema).), Disp: 30 tablet, Rfl: 11   Travoprost, BAK Free, (TRAVATAN) 0.004 % SOLN ophthalmic solution, Place 1 drop into both eyes at bedtime., Disp: , Rfl:  Allergies  Allergen Reactions   Other Other (See Comments)    Blood Product Refusal (Jehovah's witness)     Tape Other (See Comments)    Leaves blisters and marks on the skin   Sulfa Antibiotics Rash   Uloric [Febuxostat] Rash     Social History  Socioeconomic History   Marital status: Widowed    Spouse name: Not on file   Number of children: 3   Years of education: Not on file   Highest education level: Some college, no degree  Occupational History   Occupation: retired  Tobacco Use   Smoking status: Former    Packs/day: 1.00    Years: 22.00    Total pack years: 22.00    Types: Cigarettes    Quit date: 1983    Years since quitting: 40.7   Smokeless tobacco: Never  Vaping Use   Vaping Use: Never used  Substance and Sexual Activity   Alcohol use: Yes    Comment: 05/22/2018 "glass of wine couple times/wk"   Drug use: Not Currently   Sexual activity: Not Currently    Birth control/protection: None, Post-menopausal  Other Topics Concern   Not on file  Social History Narrative   Not on file   Social Determinants of Health   Financial Resource Strain: Medium Risk (12/22/2021)   Overall Financial Resource Strain (CARDIA)    Difficulty of Paying Living Expenses: Somewhat hard  Food Insecurity: No Food Insecurity (05/08/2018)   Hunger Vital Sign    Worried About Running Out of Food in the Last Year: Never true    [place] in the Last Year: Never  true  Transportation Needs: No Transportation Needs (05/08/2018)   PRAPARE - Hydrologist (Medical): No    Lack of Transportation (Non-Medical): No  Physical Activity: Not on file  Stress: Not on file  Social Connections: Not on file  Intimate Partner Violence: Not on file    Physical Exam      Future Appointments  Date Time Provider [place]  02/21/2022 11:00 AM Bensimhon, Shaune Pascal, MD [facility] None  04/27/2022  9:10 AM Shamleffer, Melanie Crazier, MD [facility] None  05/02/2022  7:00 AM [facility] DEVICE REMOTES [facility]     ACTION: Home visit completed

## 2022-02-14 DIAGNOSIS — M1990 Unspecified osteoarthritis, unspecified site: Secondary | ICD-10-CM | POA: Diagnosis not present

## 2022-02-14 DIAGNOSIS — E039 Hypothyroidism, unspecified: Secondary | ICD-10-CM | POA: Diagnosis not present

## 2022-02-14 DIAGNOSIS — E1122 Type 2 diabetes mellitus with diabetic chronic kidney disease: Secondary | ICD-10-CM | POA: Diagnosis not present

## 2022-02-14 DIAGNOSIS — N183 Chronic kidney disease, stage 3 unspecified: Secondary | ICD-10-CM | POA: Diagnosis not present

## 2022-02-14 DIAGNOSIS — Z853 Personal history of malignant neoplasm of breast: Secondary | ICD-10-CM | POA: Diagnosis not present

## 2022-02-14 DIAGNOSIS — I5042 Chronic combined systolic (congestive) and diastolic (congestive) heart failure: Secondary | ICD-10-CM | POA: Diagnosis not present

## 2022-02-14 DIAGNOSIS — I480 Paroxysmal atrial fibrillation: Secondary | ICD-10-CM | POA: Diagnosis not present

## 2022-02-14 DIAGNOSIS — Z993 Dependence on wheelchair: Secondary | ICD-10-CM | POA: Diagnosis not present

## 2022-02-14 DIAGNOSIS — E113413 Type 2 diabetes mellitus with severe nonproliferative diabetic retinopathy with macular edema, bilateral: Secondary | ICD-10-CM | POA: Diagnosis not present

## 2022-02-14 DIAGNOSIS — Z96651 Presence of right artificial knee joint: Secondary | ICD-10-CM | POA: Diagnosis not present

## 2022-02-14 DIAGNOSIS — Z794 Long term (current) use of insulin: Secondary | ICD-10-CM | POA: Diagnosis not present

## 2022-02-14 DIAGNOSIS — I110 Hypertensive heart disease with heart failure: Secondary | ICD-10-CM | POA: Diagnosis not present

## 2022-02-14 DIAGNOSIS — T84092D Other mechanical complication of internal right knee prosthesis, subsequent encounter: Secondary | ICD-10-CM | POA: Diagnosis not present

## 2022-02-14 DIAGNOSIS — Z950 Presence of cardiac pacemaker: Secondary | ICD-10-CM | POA: Diagnosis not present

## 2022-02-14 DIAGNOSIS — G4733 Obstructive sleep apnea (adult) (pediatric): Secondary | ICD-10-CM | POA: Diagnosis not present

## 2022-02-14 DIAGNOSIS — Z9181 History of falling: Secondary | ICD-10-CM | POA: Diagnosis not present

## 2022-02-14 DIAGNOSIS — Z86718 Personal history of other venous thrombosis and embolism: Secondary | ICD-10-CM | POA: Diagnosis not present

## 2022-02-14 DIAGNOSIS — E1142 Type 2 diabetes mellitus with diabetic polyneuropathy: Secondary | ICD-10-CM | POA: Diagnosis not present

## 2022-02-14 DIAGNOSIS — I2510 Atherosclerotic heart disease of native coronary artery without angina pectoris: Secondary | ICD-10-CM | POA: Diagnosis not present

## 2022-02-15 ENCOUNTER — Ambulatory Visit (INDEPENDENT_AMBULATORY_CARE_PROVIDER_SITE_OTHER): Payer: Medicare HMO | Admitting: Internal Medicine

## 2022-02-15 ENCOUNTER — Telehealth (HOSPITAL_COMMUNITY): Payer: Self-pay

## 2022-02-15 ENCOUNTER — Other Ambulatory Visit (HOSPITAL_COMMUNITY): Payer: Self-pay

## 2022-02-15 ENCOUNTER — Encounter: Payer: Self-pay | Admitting: Internal Medicine

## 2022-02-15 VITALS — BP 132/86 | HR 73 | Ht 64.0 in | Wt 201.0 lb

## 2022-02-15 DIAGNOSIS — E1122 Type 2 diabetes mellitus with diabetic chronic kidney disease: Secondary | ICD-10-CM

## 2022-02-15 DIAGNOSIS — N1831 Chronic kidney disease, stage 3a: Secondary | ICD-10-CM | POA: Diagnosis not present

## 2022-02-15 DIAGNOSIS — E039 Hypothyroidism, unspecified: Secondary | ICD-10-CM | POA: Diagnosis not present

## 2022-02-15 DIAGNOSIS — E113413 Type 2 diabetes mellitus with severe nonproliferative diabetic retinopathy with macular edema, bilateral: Secondary | ICD-10-CM | POA: Diagnosis not present

## 2022-02-15 DIAGNOSIS — E1159 Type 2 diabetes mellitus with other circulatory complications: Secondary | ICD-10-CM

## 2022-02-15 DIAGNOSIS — I5042 Chronic combined systolic (congestive) and diastolic (congestive) heart failure: Secondary | ICD-10-CM | POA: Diagnosis not present

## 2022-02-15 DIAGNOSIS — M1990 Unspecified osteoarthritis, unspecified site: Secondary | ICD-10-CM | POA: Diagnosis not present

## 2022-02-15 DIAGNOSIS — Z794 Long term (current) use of insulin: Secondary | ICD-10-CM

## 2022-02-15 DIAGNOSIS — Z950 Presence of cardiac pacemaker: Secondary | ICD-10-CM | POA: Diagnosis not present

## 2022-02-15 DIAGNOSIS — I110 Hypertensive heart disease with heart failure: Secondary | ICD-10-CM | POA: Diagnosis not present

## 2022-02-15 DIAGNOSIS — Z86718 Personal history of other venous thrombosis and embolism: Secondary | ICD-10-CM | POA: Diagnosis not present

## 2022-02-15 DIAGNOSIS — Z853 Personal history of malignant neoplasm of breast: Secondary | ICD-10-CM | POA: Diagnosis not present

## 2022-02-15 DIAGNOSIS — N183 Chronic kidney disease, stage 3 unspecified: Secondary | ICD-10-CM | POA: Diagnosis not present

## 2022-02-15 DIAGNOSIS — E1142 Type 2 diabetes mellitus with diabetic polyneuropathy: Secondary | ICD-10-CM | POA: Diagnosis not present

## 2022-02-15 DIAGNOSIS — I480 Paroxysmal atrial fibrillation: Secondary | ICD-10-CM | POA: Diagnosis not present

## 2022-02-15 DIAGNOSIS — G4733 Obstructive sleep apnea (adult) (pediatric): Secondary | ICD-10-CM | POA: Diagnosis not present

## 2022-02-15 DIAGNOSIS — Z9181 History of falling: Secondary | ICD-10-CM | POA: Diagnosis not present

## 2022-02-15 DIAGNOSIS — T84092D Other mechanical complication of internal right knee prosthesis, subsequent encounter: Secondary | ICD-10-CM | POA: Diagnosis not present

## 2022-02-15 DIAGNOSIS — Z993 Dependence on wheelchair: Secondary | ICD-10-CM | POA: Diagnosis not present

## 2022-02-15 DIAGNOSIS — I2510 Atherosclerotic heart disease of native coronary artery without angina pectoris: Secondary | ICD-10-CM | POA: Diagnosis not present

## 2022-02-15 DIAGNOSIS — Z96651 Presence of right artificial knee joint: Secondary | ICD-10-CM | POA: Diagnosis not present

## 2022-02-15 LAB — POCT GLUCOSE (DEVICE FOR HOME USE): Glucose Fasting, POC: 148 mg/dL — AB (ref 70–99)

## 2022-02-15 LAB — POCT GLYCOSYLATED HEMOGLOBIN (HGB A1C): Hemoglobin A1C: 6 % — AB (ref 4.0–5.6)

## 2022-02-15 NOTE — Telephone Encounter (Signed)
Reached out to patient and explained that at her last visit her AHI was too high.  Dr Radford Pax said I have not seen her in over 2 to 3 years and at the last office visit her AHI was too high.  Please order a CPAP titration so we can get her adequately titrated. Patient states she was half asleep and she would call back when she is awake. Patient was given [phone] to call back.

## 2022-02-15 NOTE — [place] Notes (Signed)
Paramedicine Encounter    Patient ID: Danielle [name], female    DOB: [birth year], 76 y.o.   MRN: [id number]   [name] contacted me reporting her Dexcom was acting up so I met her at her home and was able to assess same. Her bluetooth got turned off, I was able to re-connect it. New sensor installed and medications reviewed from her endocrinology appointment today. She understood same and felt comfortable with changes.   She stated that her CPAP machine is broken and not working- she contacted [facility] where the machine came from and they report she needs a new order for same. I will send over to triage.   I reviewed meds and filled her pill box to allow her to have one weeks worth of meds until our clinic visit next week.   I contacted her to confirm dexcom was working after warm up and CBG is 189 as of 3:00.    Visit complete, I will follow up in clinic next week.   [name], EMT-Paramedic [phone] 02/15/2022   ACTION: Home visit completed

## 2022-02-15 NOTE — Telephone Encounter (Signed)
Saw Danielle [name] in the home today and she stated that her CPAP machine is broken and not working- she contacted [facility] where the machine came from and they report she needs a new order for same. I will forward Dr. Radford Pax to see if she is able to assist in sending a new order to [facility] on [address]. for new CPAP machine and equipment.   [name], [place] 02/15/2022

## 2022-02-15 NOTE — [place] Notes (Signed)
Name: Danielle [name]  Age/ Sex: 76 y.o., female   MRN/ DOB: [id number], 1945-11-25     PCP: Seward Carol, MD   Reason for Endocrinology Evaluation: Type 2 Diabetes Mellitus  Initial Endocrine Consultative Visit: 05/14/2019    PATIENT IDENTIFIER: Danielle [name] is a 76 y.o. female with a past medical history of T2DM, HTN, Dyslipidemia, CAD, Hx of Breast Ca (S/P chemo and lumpectomy)  . The patient has followed with Endocrinology clinic since 05/14/2019 for consultative assistance with management of her diabetes.  DIABETIC HISTORY:  [name] was diagnosed with T2DM > 20 yrs ago. Has been on  Metformin in the past which caused diarrhea, has been on prandial insulin in the past  . Her hemoglobin A1c has ranged from 6.9% in 2019, peaking at 8.0% in 2020.  On her initial visit to our clinic her A1c was 11.2% . She was on Jardiance and Lantus 130 units daily. I reduced her lantus to 80 units, added prandial insulin.   Jardriance initiated  By cardiology 09/2019  SUBJECTIVE:   During the last visit (07/27/2020): A1c 7.1%. We continued jardiance and adjusted MDI regimen.     Today (02/15/2022): Ms. [name] is here for a follow up on diabetes management.  She  has NOT been to our clinic in 13 months.She is accompanied by her sister today   She has been using Dexcom for glucose monitorin    She had surgery in July for prosthetic joint implant failure of the right knee through Dr. 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  (S/P laser)  Last eye exam: Completed 07/06/2020    Macrovascular complications:  CAD ( S/P MI and stent placement) Denies: PVD, CVA     HISTORY:  Past Medical History:  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[place]) 02/05/2017   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 05/20/2018   Acute kidney failure ([place]) 05/15/2018   Arthritis    "all over" (05/22/2018)   Blood dyscrasia    per pt-has small blood cells-appears as if anemic   Breast cancer, left breast ([place]) 1985   CHF (congestive heart failure) ([place])    Complication of anesthesia    difficult to awaken from per pt   Coronary artery disease    Dyspnea    Essential hypertension    Heart disease    Heart murmur    History of gout    Hypothyroidism    Obesity (BMI 30-39.9) 11/11/2017   OSA (obstructive sleep apnea) 02/18/2017    severe obstructive sleep apnea with an AHI of 75.6/h and mild central sleep apnea with a CAI of 7.7/h.  Oxygen saturations dropped as low as 82%.   He is on CPAP at 9 cm H2O.   OSA on CPAP    Personal history of chemotherapy  Personal history of radiation therapy    Presence of permanent cardiac pacemaker    Primary localized osteoarthritis of right knee 05/13/2018   Refusal of blood transfusions as patient is Jehovah's Witness    Type II diabetes mellitus ([place])    Past Surgical History:  Past Surgical History:  Procedure Laterality Date   APPLICATION OF WOUND VAC Right 04/14/2021   Procedure: APPLICATION OF WOUND VAC;  Surgeon: Willaim Sheng, MD;  Location: [facility];  Service: Orthopedics;  Laterality: Right;   BREAST BIOPSY Left 1985   BREAST LUMPECTOMY Left 1985   BREAST LUMPECTOMY WITH NEEDLE LOCALIZATION AND AXILLARY LYMPH NODE DISSECTION  1985   CATARACT EXTRACTION W/ INTRAOCULAR LENS  IMPLANT, BILATERAL Bilateral    CORONARY ANGIOPLASTY WITH STENT PLACEMENT  2010,  2012,2017    2 done 2010 and 1 replaced 2012 and 2 replaced in 2017   [facility]     EXCISIONAL TOTAL KNEE ARTHROPLASTY WITH ANTIBIOTIC SPACERS Right 07/12/2021   Procedure: EXCISIONAL TOTAL KNEE ARTHROPLASTY WITH ANTIBIOTIC SPACERS;  Surgeon: Willaim Sheng, MD;  Location: [facility];  Service: Orthopedics;  Laterality: Right;   I & D KNEE WITH POLY EXCHANGE Right 04/14/2021   Procedure: IRRIGATION AND DEBRIDEMENT KNEE WITH POLY EXCHANGE;  Surgeon: Willaim Sheng, MD;  Location: [facility];  Service: Orthopedics;  Laterality: Right;   INSERT / REPLACE / REMOVE PACEMAKER  07/19/2014   JOINT REPLACEMENT     REPLACEMENT TOTAL KNEE Left 09/2012   THYROIDECTOMY, PARTIAL  1978   TONSILLECTOMY     TOTAL KNEE ARTHROPLASTY Right 05/13/2018   Procedure: TOTAL KNEE ARTHROPLASTY;  Surgeon: Marchia Bond, MD;  Location: [facility];  Service: Orthopedics;  Laterality: Right;   TOTAL KNEE REVISION Right 12/20/2021   Procedure: REMOVAL OF RIGHT KNEE ANTIBIOTIC SPACER AND REVISION RIGHT KNEE TOTAL ARTHROPLASTY;  Surgeon: Willaim Sheng, MD;  Location: [facility];  Service: Orthopedics;  Laterality: Right;   TUBAL LIGATION     Social History:  reports that she quit smoking about 40 years ago. Her smoking use included cigarettes. She has a 22.00 pack-year smoking history. She has never used smokeless tobacco. She reports current alcohol use. She reports that she does not currently use drugs. Family History:  Family History  Problem Relation Age of Onset   Multiple myeloma Mother    Heart disease Father    Other Sister    Heart disease Brother    Diabetes Sister    Other Sister      HOME MEDICATIONS: Allergies as of 02/15/2022       Reactions   Codeine Other (See Comments)   Other Other (See Comments)   Blood Product Refusal (Jehovah's witness)   Tape Other (See Comments)   Leaves blisters and marks on the skin   Sulfa Antibiotics Rash   Uloric [febuxostat] Rash         Medication List        Accurate as of February 15, 2022  8:38 AM. If you have any questions, ask your nurse or doctor.          STOP taking these medications    NovoLOG FlexPen 100 UNIT/ML FlexPen Generic drug: insulin aspart Stopped by: Dorita Sciara, MD       TAKE these medications    acetaminophen 500 MG tablet Commonly known as: TYLENOL Take 500-1,000 mg by mouth every 6 (six) hours as needed (for pain or headaches).   ALIVE ONCE DAILY WOMENS 50+  PO Take 1 tablet by mouth daily.   allopurinol 100 MG tablet Commonly known as: ZYLOPRIM Take 100 mg by mouth daily.   apixaban 5 MG Tabs tablet Commonly known as: Eliquis Take 1 tablet (5 mg total) by mouth 2 (two) times daily.   atorvastatin 80 MG tablet Commonly known as: LIPITOR Take 1 tablet (80 mg total) by mouth at bedtime.   Basaglar KwikPen 100 UNIT/ML Inject 45 Units into the skin at bedtime.   Biotin 10 MG Tabs Take 10 mg by mouth daily.   carvedilol 3.125 MG tablet Commonly known as: COREG Take 1 tablet (3.125 mg total) by mouth 2 (two) times daily with a meal.   clotrimazole-betamethasone lotion Commonly known as: LOTRISONE Apply 1 application topically 2 (two) times daily as needed (itching).   Dexcom G6 Sensor Misc 1 Device by Does not apply route as directed.   Dexcom G6 Transmitter Misc 1 Device by Does not apply route as directed.   empagliflozin 10 MG Tabs tablet Commonly known as: JARDIANCE Take 10 mg by mouth daily.   folic acid 1 MG tablet Commonly known as: FOLVITE Take 1 tablet (1 mg total) by mouth daily.   gabapentin 300 MG capsule Commonly known as: NEURONTIN Take 300 mg by mouth 2 (two) times daily.   insulin lispro 100 UNIT/ML injection Commonly known as: HUMALOG Inject 0.05 mLs (5 Units total) into the skin 3 (three) times daily before meals.   Iron 325 (65 Fe) MG Tabs Take 1 tablet (325 mg total) by mouth 2 (two) times daily before a meal.    latanoprost 0.005 % ophthalmic solution Commonly known as: XALATAN Place 1 drop into both eyes at bedtime.   levothyroxine 75 MCG tablet Commonly known as: SYNTHROID Take 75 mcg by mouth daily before breakfast.   losartan 25 MG tablet Commonly known as: COZAAR Take 1 tablet (25 mg total) by mouth daily.   Lumigan 0.01 % Soln Generic drug: bimatoprost Place 1 drop into both eyes at bedtime.   OneTouch Verio w/Device Kit Use to check blood sugar   spironolactone 25 MG tablet Commonly known as: ALDACTONE Take 0.5 tablets (12.5 mg total) by mouth daily.   torsemide 20 MG tablet Commonly known as: DEMADEX Take 1 tablet (20 mg total) by mouth as needed. What changed:  when to take this reasons to take this   Travoprost (BAK Free) 0.004 % Soln ophthalmic solution Commonly known as: TRAVATAN Place 1 drop into both eyes at bedtime.         OBJECTIVE:   Vital Signs: BP 132/86 (BP Location: Left Arm, Patient Position: Sitting, Cuff Size: Large)   Pulse 73   Ht 5' 4" (1.626 m)   Wt 201 lb (91.2 kg)   SpO2 99%   BMI 34.50 kg/m   Wt Readings from Last 3 Encounters:  02/15/22 201 lb (91.2 kg)  02/13/22 206 lb (93.4 kg)  01/30/22 207 lb (93.9 kg)     EXAM: General: Pt appears well and is in NAD Bilateral cerumen impaction noted  Heart: RRR , + systolic murmur  Lungs: CTA  Extremities:  BL LE: 1+  pretibial edema   Mental Status: Judgment, insight: intact Orientation: oriented to time, place, and person Mood and affect: no depression, anxiety, or agitation      DATA REVIEWED:     Lab Results  Component Value Date   HGBA1C 6.0 (A) 02/15/2022   HGBA1C 7.7 (H) 12/11/2021   HGBA1C 8.2 (H) 07/10/2021       Latest Reference Range & Units 01/03/22 02:00  Sodium 135 - 145 mmol/L 138  Potassium 3.5 - 5.1 mmol/L 5.0  Chloride 98 - 111 mmol/L 105  CO2 22 - 32 mmol/L 26  Glucose 70 - 99 mg/dL 121 (H)  BUN 8 - 23 mg/dL 22  Creatinine 0.44 - 1.00 mg/dL 1.26  (H)  Calcium 8.9 - 10.3 mg/dL 8.9  Anion gap 5 - 15  7  GFR, Estimated >60 mL/min 45 (L)    In office BG 142 mg/dL     ASSESSMENT / PLAN / RECOMMENDATIONS:   1) Type 2 Diabetes Mellitus, Neuropathic, CKD III and Macrovascular  complications - Most recent A1c of 6.1%. Goal A1c < 7.5 %.    -A1c is skewed due CKD -Limited glucose data, as we were unable to download her Dexcom today, she tends to hold off on taking Basaglar if her bedtime BG is 200 mg/DL, patient with fear of hypoglycemia, fasting BG today 142 mg/DL -I have advised the patient not to hold basal insulin as this can lead to glycemic excursions, I did recommend reducing the dose by 50% if she is concerned and not sure what to do -I will reduce her Basaglar as below -I would not change her Humalog dose due to lack of glucose data - I have recommended increasing Jardiance but she would like to discuss this with cardiology    MEDICATIONS: Decrease Basaglar  36 units ONCE daily  Continue  Jardiance 10 mg daily per cardiology Continue Humalog 5 units with each meal CF: Humalog ( BG - 140/25)   EDUCATION / INSTRUCTIONS: BG monitoring instructions: Patient is instructed to check her blood sugars 4 times a day, before each meal and bedtime. Call [facility] clinic if: BG persistently < 70 I reviewed the Rule of 15 for the treatment of hypoglycemia in detail with the patient. Literature supplied.       F/U in 4 months   Signed electronically by: Mack Guise, MD  [facility] Endocrinology  [facility] [address] [place], [place] [address] Phone: [phone] FAX: [phone]   CC: Seward Carol, [place] [employer] [address] [place] [address] Phone: [phone]  Fax: [phone]  Return to Endocrinology clinic as below: Future Appointments  Date Time Provider [place]  02/15/2022  9:10 AM [place] Abrams, Melanie Crazier, MD [facility] None   02/21/2022 11:00 AM Bensimhon, Shaune Pascal, MD [facility] None  05/02/2022  7:00 AM [facility] DEVICE REMOTES [facility]

## 2022-02-15 NOTE — Patient Instructions (Addendum)
-   Decrease Basaglar to 36 units ONCE daily  - Continue Jardiance 10 mg daily  - Take  HUmalog 5 units with each meal  -Humalog correctional insulin:Use the scale below to help guide you before meals   Blood sugar before meal Number of units to inject  Less than 165 0 unit  166 - 190 1 units  191 - 215 2 units  216 - 240 3 units  241- 265 4 units     HOW TO TREAT LOW BLOOD SUGARS (Blood sugar LESS THAN 70 MG/DL) Please follow the RULE OF 15 for the treatment of hypoglycemia treatment (when your (blood sugars are less than 70 mg/dL)   STEP 1: Take 15 grams of carbohydrates when your blood sugar is low, which includes:  3-4 GLUCOSE TABS  OR 3-4 OZ OF JUICE OR REGULAR SODA OR ONE TUBE OF GLUCOSE GEL    STEP 2: RECHECK blood sugar in 15 MINUTES STEP 3: If your blood sugar is still low at the 15 minute recheck --> then, go back to STEP 1 and treat AGAIN with another 15 grams of carbohydrates.

## 2022-02-16 DIAGNOSIS — E1142 Type 2 diabetes mellitus with diabetic polyneuropathy: Secondary | ICD-10-CM | POA: Diagnosis not present

## 2022-02-16 DIAGNOSIS — E113413 Type 2 diabetes mellitus with severe nonproliferative diabetic retinopathy with macular edema, bilateral: Secondary | ICD-10-CM | POA: Diagnosis not present

## 2022-02-16 DIAGNOSIS — Z853 Personal history of malignant neoplasm of breast: Secondary | ICD-10-CM | POA: Diagnosis not present

## 2022-02-16 DIAGNOSIS — Z993 Dependence on wheelchair: Secondary | ICD-10-CM | POA: Diagnosis not present

## 2022-02-16 DIAGNOSIS — Z96651 Presence of right artificial knee joint: Secondary | ICD-10-CM | POA: Diagnosis not present

## 2022-02-16 DIAGNOSIS — I480 Paroxysmal atrial fibrillation: Secondary | ICD-10-CM | POA: Diagnosis not present

## 2022-02-16 DIAGNOSIS — N1831 Chronic kidney disease, stage 3a: Secondary | ICD-10-CM | POA: Insufficient documentation

## 2022-02-16 DIAGNOSIS — G4733 Obstructive sleep apnea (adult) (pediatric): Secondary | ICD-10-CM | POA: Diagnosis not present

## 2022-02-16 DIAGNOSIS — I2510 Atherosclerotic heart disease of native coronary artery without angina pectoris: Secondary | ICD-10-CM | POA: Diagnosis not present

## 2022-02-16 DIAGNOSIS — I110 Hypertensive heart disease with heart failure: Secondary | ICD-10-CM | POA: Diagnosis not present

## 2022-02-16 DIAGNOSIS — E1122 Type 2 diabetes mellitus with diabetic chronic kidney disease: Secondary | ICD-10-CM | POA: Diagnosis not present

## 2022-02-16 DIAGNOSIS — M1990 Unspecified osteoarthritis, unspecified site: Secondary | ICD-10-CM | POA: Diagnosis not present

## 2022-02-16 DIAGNOSIS — N183 Chronic kidney disease, stage 3 unspecified: Secondary | ICD-10-CM | POA: Diagnosis not present

## 2022-02-16 DIAGNOSIS — Z950 Presence of cardiac pacemaker: Secondary | ICD-10-CM | POA: Diagnosis not present

## 2022-02-16 DIAGNOSIS — I5042 Chronic combined systolic (congestive) and diastolic (congestive) heart failure: Secondary | ICD-10-CM | POA: Diagnosis not present

## 2022-02-16 DIAGNOSIS — T84092D Other mechanical complication of internal right knee prosthesis, subsequent encounter: Secondary | ICD-10-CM | POA: Diagnosis not present

## 2022-02-16 DIAGNOSIS — Z9181 History of falling: Secondary | ICD-10-CM | POA: Diagnosis not present

## 2022-02-16 DIAGNOSIS — Z86718 Personal history of other venous thrombosis and embolism: Secondary | ICD-10-CM | POA: Diagnosis not present

## 2022-02-16 DIAGNOSIS — Z794 Long term (current) use of insulin: Secondary | ICD-10-CM | POA: Diagnosis not present

## 2022-02-16 DIAGNOSIS — E039 Hypothyroidism, unspecified: Secondary | ICD-10-CM | POA: Diagnosis not present

## 2022-02-19 NOTE — Progress Notes (Signed)
Remote pacemaker transmission.   

## 2022-02-20 ENCOUNTER — Telehealth (HOSPITAL_COMMUNITY): Payer: Self-pay

## 2022-02-20 NOTE — Telephone Encounter (Signed)
Called Danielle [name] to remind her of her appointment tomorrow with Dr. Haroldine Laws at 11:00. I left a message to remind her. Call complete.   [name], [place] 02/20/2022

## 2022-02-21 ENCOUNTER — Other Ambulatory Visit (HOSPITAL_COMMUNITY): Payer: Self-pay

## 2022-02-21 ENCOUNTER — Telehealth: Payer: Self-pay | Admitting: *Deleted

## 2022-02-21 ENCOUNTER — Ambulatory Visit (HOSPITAL_COMMUNITY)
Admission: RE | Admit: 2022-02-21 | Discharge: 2022-02-21 | Disposition: A | Discharge status: Home / Self Care | Payer: Medicare HMO | Source: Ambulatory Visit | Attending: Internal Medicine | Admitting: Internal Medicine

## 2022-02-21 VITALS — BP 110/78 | HR 82 | Wt 205.0 lb

## 2022-02-21 DIAGNOSIS — I480 Paroxysmal atrial fibrillation: Secondary | ICD-10-CM | POA: Insufficient documentation

## 2022-02-21 DIAGNOSIS — Z79899 Other long term (current) drug therapy: Secondary | ICD-10-CM | POA: Insufficient documentation

## 2022-02-21 DIAGNOSIS — E039 Hypothyroidism, unspecified: Secondary | ICD-10-CM | POA: Insufficient documentation

## 2022-02-21 DIAGNOSIS — I10 Essential (primary) hypertension: Secondary | ICD-10-CM | POA: Diagnosis not present

## 2022-02-21 DIAGNOSIS — G4733 Obstructive sleep apnea (adult) (pediatric): Secondary | ICD-10-CM | POA: Diagnosis not present

## 2022-02-21 DIAGNOSIS — E1122 Type 2 diabetes mellitus with diabetic chronic kidney disease: Secondary | ICD-10-CM | POA: Diagnosis not present

## 2022-02-21 DIAGNOSIS — I5032 Chronic diastolic (congestive) heart failure: Secondary | ICD-10-CM | POA: Insufficient documentation

## 2022-02-21 DIAGNOSIS — E113413 Type 2 diabetes mellitus with severe nonproliferative diabetic retinopathy with macular edema, bilateral: Secondary | ICD-10-CM | POA: Diagnosis not present

## 2022-02-21 DIAGNOSIS — Z7901 Long term (current) use of anticoagulants: Secondary | ICD-10-CM | POA: Insufficient documentation

## 2022-02-21 DIAGNOSIS — Z96651 Presence of right artificial knee joint: Secondary | ICD-10-CM | POA: Diagnosis not present

## 2022-02-21 DIAGNOSIS — I2510 Atherosclerotic heart disease of native coronary artery without angina pectoris: Secondary | ICD-10-CM | POA: Insufficient documentation

## 2022-02-21 DIAGNOSIS — I110 Hypertensive heart disease with heart failure: Secondary | ICD-10-CM | POA: Diagnosis not present

## 2022-02-21 DIAGNOSIS — T8450XS Infection and inflammatory reaction due to unspecified internal joint prosthesis, sequela: Secondary | ICD-10-CM

## 2022-02-21 DIAGNOSIS — M1990 Unspecified osteoarthritis, unspecified site: Secondary | ICD-10-CM | POA: Diagnosis not present

## 2022-02-21 DIAGNOSIS — I5042 Chronic combined systolic (congestive) and diastolic (congestive) heart failure: Secondary | ICD-10-CM | POA: Diagnosis not present

## 2022-02-21 DIAGNOSIS — Z794 Long term (current) use of insulin: Secondary | ICD-10-CM | POA: Insufficient documentation

## 2022-02-21 DIAGNOSIS — N1832 Chronic kidney disease, stage 3b: Secondary | ICD-10-CM | POA: Insufficient documentation

## 2022-02-21 DIAGNOSIS — I255 Ischemic cardiomyopathy: Secondary | ICD-10-CM | POA: Diagnosis not present

## 2022-02-21 DIAGNOSIS — Z86718 Personal history of other venous thrombosis and embolism: Secondary | ICD-10-CM | POA: Diagnosis not present

## 2022-02-21 DIAGNOSIS — T84092D Other mechanical complication of internal right knee prosthesis, subsequent encounter: Secondary | ICD-10-CM | POA: Diagnosis not present

## 2022-02-21 DIAGNOSIS — N183 Chronic kidney disease, stage 3 unspecified: Secondary | ICD-10-CM | POA: Diagnosis not present

## 2022-02-21 DIAGNOSIS — Z955 Presence of coronary angioplasty implant and graft: Secondary | ICD-10-CM | POA: Insufficient documentation

## 2022-02-21 DIAGNOSIS — Z950 Presence of cardiac pacemaker: Secondary | ICD-10-CM | POA: Diagnosis not present

## 2022-02-21 DIAGNOSIS — Z7984 Long term (current) use of oral hypoglycemic drugs: Secondary | ICD-10-CM | POA: Insufficient documentation

## 2022-02-21 DIAGNOSIS — I130 Hypertensive heart and chronic kidney disease with heart failure and stage 1 through stage 4 chronic kidney disease, or unspecified chronic kidney disease: Secondary | ICD-10-CM | POA: Insufficient documentation

## 2022-02-21 DIAGNOSIS — Z853 Personal history of malignant neoplasm of breast: Secondary | ICD-10-CM | POA: Diagnosis not present

## 2022-02-21 DIAGNOSIS — E1142 Type 2 diabetes mellitus with diabetic polyneuropathy: Secondary | ICD-10-CM | POA: Diagnosis not present

## 2022-02-21 DIAGNOSIS — Z993 Dependence on wheelchair: Secondary | ICD-10-CM | POA: Diagnosis not present

## 2022-02-21 DIAGNOSIS — Z9181 History of falling: Secondary | ICD-10-CM | POA: Diagnosis not present

## 2022-02-21 LAB — BASIC METABOLIC PANEL
Anion gap: 7 (ref 5–15)
BUN: 26 mg/dL — ABNORMAL HIGH (ref 8–23)
CO2: 27 mmol/L (ref 22–32)
Calcium: 9 mg/dL (ref 8.9–10.3)
Chloride: 108 mmol/L (ref 98–111)
Creatinine, Ser: 1.48 mg/dL — ABNORMAL HIGH (ref 0.44–1.00)
GFR, Estimated: 37 mL/min — ABNORMAL LOW (ref >60)
Glucose, Bld: 120 mg/dL — ABNORMAL HIGH (ref 70–99)
Potassium: 4.5 mmol/L (ref 3.5–5.1)
Sodium: 142 mmol/L (ref 135–145)

## 2022-02-21 LAB — CBC
HCT: 29.6 % — ABNORMAL LOW (ref 36.0–46.0)
Hemoglobin: 8.7 g/dL — ABNORMAL LOW (ref 12.0–15.0)
MCH: 20.8 pg — ABNORMAL LOW (ref 26.0–34.0)
MCHC: 29.4 g/dL — ABNORMAL LOW (ref 30.0–36.0)
MCV: 70.8 fL — ABNORMAL LOW (ref 80.0–100.0)
Platelets: 193 10*3/uL (ref 150–400)
RBC: 4.18 MIL/uL (ref 3.87–5.11)
RDW: 17.2 % — ABNORMAL HIGH (ref 11.5–15.5)
WBC: 6.6 10*3/uL (ref 4.0–10.5)
nRBC: 0 % (ref 0.0–0.2)

## 2022-02-21 LAB — BRAIN NATRIURETIC PEPTIDE: B Natriuretic Peptide: 387.1 pg/mL — ABNORMAL HIGH (ref 0.0–100.0)

## 2022-02-21 MED ORDER — IRON 325 (65 FE) MG PO TABS: 1.0000 | ORAL_TABLET | Freq: Two times a day (BID) | ORAL | 3 refills | Status: DC

## 2022-02-21 NOTE — Telephone Encounter (Signed)
Call came from patient stating she wanted to bring her machine to me to fix it from blowing too hard. Informed patient I do not fix the cpap machines and that she would have to call her dme speak to a RT to trouble shoot and then I could ask the doctor to write a Rx for pressure changes or supplies. Patient has not been using her cpap since 09/2017 but started back using it two days ago. Patient was agreeable and she will call her dme [facility] on Thursday for help.

## 2022-02-21 NOTE — Progress Notes (Signed)
Paramedicine Encounter    Patient ID: Danielle [name], female    DOB: 1945/09/26, 76 y.o.   MRN: [id number]   Arrived for clinic visit for [facility] where she was seen by Dr. Haroldine Laws. He noted her swelling was still present in her lower legs. She had on compression stockings today. On assessment he noted squeaky aortic valve and ordered an ECHO in 3 mos.   Dr. Haroldine Laws made the following changes:   -Torsemide 35m daily  -Schedule ECHO and 3 mo follow up -Labs today   Refills: Ferrous Sulfate Celebrex (pending refills from Ortho)  CBG today at visit- 155 per Dexcom   I reviewed upcoming appointments with her and confirmed same. I plan to see FMichaelin one week. She agreed. Visit complete.   HSalena Saner ECheatham9/27/2023     Patient Care Team: PSeward Carol MD as PCP - General (Internal Medicine) CConstance Haw MD as PCP - Electrophysiology (Cardiology) TSueanne Margarita MD as PCP - Sleep Medicine (Cardiology) Bensimhon, DShaune Pascal MD as PCP - Cardiology (Cardiology) UJorge Ny LCSW as Social Worker (Licensed Clinical Social Worker) Shamleffer, IMelanie Crazier MD as Consulting Physician (Endocrinology)  Patient Active Problem List   Diagnosis Date Noted   Type 2 diabetes mellitus with stage 3a chronic kidney disease, with long-term current use of insulin ([facility] 02/16/2022   Prosthetic joint implant failure, initial encounter ([facility] 12/20/2021   Pseudophakia of both eyes 11/15/2021   DVT (deep venous thrombosis) ([facility] 07/31/2021   Acute blood loss anemia    Septic Arthritis, Infection of prosthetic right knee joint ([place], MSSA infection 049/20/1007  Complication of internal right knee prosthesis (H[place] 07/10/2021   CAD (coronary artery disease) 06/08/2021   Carotid artery disease ([facility] 06/08/2021   Symptomatic bradycardia 06/08/2021   Paroxysmal atrial fibrillation ([place] 06/08/2021   CKD (chronic kidney disease), stage III ([facility]  06/08/2021   Polyneuropathy associated with underlying disease ([place] 05/04/2020   Severe nonproliferative diabetic retinopathy of left eye, with macular edema, associated with type 2 diabetes mellitus ([facility] 11/05/2019   Severe nonproliferative diabetic retinopathy of right eye, with macular edema, associated with type 2 diabetes mellitus (HB[place] 11/05/2019   Retinal hemorrhage of right eye 11/05/2019   Retinal hemorrhage of left eye 11/05/2019   Diabetes mellitus (HC[place] 05/14/2019   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, with long-term current use of insulin ([place] 05/14/2019   Type 2 diabetes mellitus with diabetic polyneuropathy, with long-term current use of insulin (H[place] 05/14/2019   History of gout 05/20/2018   Primary localized osteoarthritis of right knee 05/13/2018   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) 12/20/2017   Obesity (BMI 30-39.9) 11/11/2017   OSA (obstructive sleep apnea) 02/18/2017   Essential hypertension    L Breast Cancer    Hypothyroidism     Current Outpatient Medications:    acetaminophen (TYLENOL) 500 MG tablet, Take 500-1,000 mg by mouth every 6 (six) hours as needed (for pain or headaches)., Disp: , Rfl:    allopurinol (ZYLOPRIM) 100 MG tablet, Take 100 mg by mouth daily., Disp: , Rfl:    apixaban (ELIQUIS) 5 MG TABS tablet, Take 1 tablet (5 mg total) by mouth 2 (two) times daily., Disp: 60 tablet, Rfl: 11   atorvastatin (LIPITOR) 80 MG tablet, Take 1 tablet (80 mg total) by mouth at bedtime., Disp: 90 tablet, Rfl: 3   Biotin 10 MG TABS, Take 10 mg by mouth daily., Disp: , Rfl:    Blood Glucose Monitoring  Suppl (ONETOUCH VERIO) w/Device KIT, Use to check blood sugar, Disp: 1 kit, Rfl: 0   carvedilol (COREG) 3.125 MG tablet, Take 1 tablet (3.125 mg total) by mouth 2 (two) times daily with a meal., Disp: 60 tablet, Rfl: 6   celecoxib (CELEBREX) 100 MG capsule, Take 100 mg by mouth 2 (two) times daily., Disp: , Rfl:     clotrimazole-betamethasone (LOTRISONE) lotion, Apply 1 application topically 2 (two) times daily as needed (itching)., Disp: , Rfl:    Continuous Blood Gluc Sensor (DEXCOM G6 SENSOR) MISC, 1 Device by Does not apply route as directed., Disp: 9 each, Rfl: 3   Continuous Blood Gluc Transmit (DEXCOM G6 TRANSMITTER) MISC, 1 Device by Does not apply route as directed., Disp: 1 each, Rfl: 3   empagliflozin (JARDIANCE) 10 MG TABS tablet, Take 10 mg by mouth daily., Disp: , Rfl:    Ferrous Sulfate (IRON) 325 (65 Fe) MG TABS, Take 1 tablet (325 mg total) by mouth 2 (two) times daily before a meal., Disp: 180 tablet, Rfl: 3   folic acid (FOLVITE) 1 MG tablet, Take 1 tablet (1 mg total) by mouth daily., Disp: 30 tablet, Rfl: 2   gabapentin (NEURONTIN) 300 MG capsule, Take 300 mg by mouth 2 (two) times daily., Disp: , Rfl:    Insulin Glargine (BASAGLAR KWIKPEN) 100 UNIT/ML, Inject 45 Units into the skin at bedtime., Disp: 45 mL, Rfl: 0   insulin lispro (HUMALOG) 100 UNIT/ML injection, Inject 0.05 mLs (5 Units total) into the skin 3 (three) times daily before meals., Disp: 10 mL, Rfl: 0   latanoprost (XALATAN) 0.005 % ophthalmic solution, Place 1 drop into both eyes at bedtime., Disp: , Rfl:    levothyroxine (SYNTHROID, LEVOTHROID) 75 MCG tablet, Take 75 mcg by mouth daily before breakfast., Disp: , Rfl:    losartan (COZAAR) 25 MG tablet, Take 1 tablet (25 mg total) by mouth daily., Disp: 30 tablet, Rfl: 6   LUMIGAN 0.01 % SOLN, Place 1 drop into both eyes at bedtime., Disp: , Rfl:    Multiple Vitamins-Minerals (ALIVE ONCE DAILY WOMENS 50+ PO), Take 1 tablet by mouth daily., Disp: , Rfl:    spironolactone (ALDACTONE) 25 MG tablet, Take 0.5 tablets (12.5 mg total) by mouth daily., Disp: 90 tablet, Rfl: 0   torsemide (DEMADEX) 20 MG tablet, Take 1 tablet (20 mg total) by mouth as needed. (Patient taking differently: Take 20 mg by mouth daily as needed (edema).), Disp: 30 tablet, Rfl: 11   Travoprost, BAK Free,  (TRAVATAN) 0.004 % SOLN ophthalmic solution, Place 1 drop into both eyes at bedtime., Disp: , Rfl:  Allergies  Allergen Reactions   Codeine Other (See Comments)   Other Other (See Comments)    Blood Product Refusal (Jehovah's witness)     Tape Other (See Comments)    Leaves blisters and marks on the skin   Sulfa Antibiotics Rash   Uloric [Febuxostat] Rash     Social History   Socioeconomic History   Marital status: Widowed    Spouse name: Not on file   Number of children: 3   Years of education: Not on file   Highest education level: Some college, no degree  Occupational History   Occupation: retired  Tobacco Use   Smoking status: Former    Packs/day: 1.00    Years: 22.00    Total pack years: 22.00    Types: Cigarettes    Quit date: 1983    Years since quitting: 40.7   Smokeless tobacco:  Never  Vaping Use   Vaping Use: Never used  Substance and Sexual Activity   Alcohol use: Yes    Comment: 05/22/2018 "glass of wine couple times/wk"   Drug use: Not Currently   Sexual activity: Not Currently    Birth control/protection: None, Post-menopausal  Other Topics Concern   Not on file  Social History Narrative   Not on file   Social Determinants of Health   Financial Resource Strain: Medium Risk (12/22/2021)   Overall Financial Resource Strain (CARDIA)    Difficulty of Paying Living Expenses: Somewhat hard  Food Insecurity: No Food Insecurity (05/08/2018)   Hunger Vital Sign    Worried About Running Out of Food in the Last Year: Never true    Ran Out of Food in the Last Year: Never true  Transportation Needs: No Transportation Needs (05/08/2018)   PRAPARE - Hydrologist (Medical): No    Lack of Transportation (Non-Medical): No  Physical Activity: Not on file  Stress: Not on file  Social Connections: Not on file  Intimate Partner Violence: Not on file    Physical Exam      Future Appointments  Date Time Provider [place]  05/02/2022  7:00 AM [facility] DEVICE REMOTES [facility]  05/08/2022  9:05 AM [facility] ECHO/CH OP [facility]  05/08/2022 11:00 AM [facility] PA/NP [facility] None  08/16/2022 10:30 AM Shamleffer, Melanie Crazier, MD [facility] None     ACTION: Home visit completed

## 2022-02-21 NOTE — Progress Notes (Signed)
Advanced Heart Failure Clinic Note   PCP: Seward Carol, MD EP: Dr. Curt Bears Rheumatology: Dr. Posey Rea HF Cardiologist: Dr. Haroldine Laws  HPI: Danielle [name] is a 76 y.o.female with a past medical history of systolic HF (EF 74-82%), CAD s/p multiple stents in [place] (with h/o LAD stent thrombosis), ischemic cardiomyopathy (EF 10% at one point), symptomatic bradycardia s/p PPM, HTN, DM, hypothyroidism, and OSA.   Admitted 9/18 with SOB, acute on chronic systolic CHF. Diuresed with IV lasix (12 pounds), discharge weight was 215 pounds.    Had R TKR 12/19.    Admitted 11/22 with right knee septic arthritis. Arthrocentesis culture grew staph aureus.  Underwent I&D and polyexchange of infected right total knee arthroplasty.  Treated with 6 weeks IV antibiotics followed by suppressive abx. Aldactone held, losartan reduced to 25 mg daily and coreg reduced to 12.5 mg BID d/t soft BP. She was given 1 dose of furosemide on admission due to leg edema but this was held later due to AKI.  Torsemide was resumed at discharge.  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  Patient underwent explant right total knee arthroplasty with implantation of articulating antibiotic spacer by Dr. Zachery Dakins on 07/12/2021. Placed on IV abx. Was discharged to SNF. During hospitalization was very anemic, but improving.   Echo 2/23 EF 40-45%   Follow up 4/23, unsure what meds she was taking. Re-enrolled in paramedicine.   Had knee re-replaced on 12/20/21  Today she returns for HF follow up with paramedic, [name]. Overall feeling fine. Over past week fluid was accumulating and restarted torsemide 20 daily. Weight down 205 -> 201    Cardiac Studies: Echo (9/18): EF 45-50% Grade 2DD Left Atrium Severely dilated.  Echo (8/19): EF 45-50%, G2DD, LA mildly dilated, trileaflet moderately thick aortic valve Echo (12/21): EF 45-50% G2DD  Echo (2/23): EF 40-45%   ROS: All systems negative  except as listed in HPI, PMH and Problem List.  SH:  Social History   Socioeconomic History   Marital status: Widowed    Spouse name: Not on file   Number of children: 3   Years of education: Not on file   Highest education level: Some college, no degree  Occupational History   Occupation: retired  Tobacco Use   Smoking status: Former    Packs/day: 1.00    Years: 22.00    Total pack years: 22.00    Types: Cigarettes    Quit date: 1983    Years since quitting: 40.7   Smokeless tobacco: Never  Vaping Use   Vaping Use: Never used  Substance and Sexual Activity   Alcohol use: Yes    Comment: 05/22/2018 "glass of wine couple times/wk"   Drug use: Not Currently   Sexual activity: Not Currently    Birth control/protection: None, Post-menopausal  Other Topics Concern   Not on file  Social History Narrative   Not on file   Social Determinants of Health   Financial Resource Strain: Medium Risk (12/22/2021)   Overall Financial Resource Strain (CARDIA)    Difficulty of Paying Living Expenses: Somewhat hard  Food Insecurity: No Food Insecurity (05/08/2018)   Hunger Vital Sign    Worried About Running Out of Food in the Last Year: Never true    [place] in the Last Year: Never true  Transportation Needs: No Transportation Needs (05/08/2018)   PRAPARE - Hydrologist (Medical): No    Lack of Transportation (Non-Medical):  No  Physical Activity: Not on file  Stress: Not on file  Social Connections: Not on file  Intimate Partner Violence: Not on file    FH:  Family History  Problem Relation Age of Onset   Multiple myeloma Mother    Heart disease Father    Other Sister    Heart disease Brother    Diabetes Sister    Other Sister     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[place]) 02/05/2017   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 05/20/2018   Acute kidney  failure ([place]) 05/15/2018   Arthritis    "all over" (05/22/2018)   Blood dyscrasia    per pt-has small blood cells-appears as if anemic   Breast cancer, left breast ([address]) 1985   CHF (congestive heart failure) (HCC)    Complication of anesthesia    difficult to awaken from per pt   Coronary artery disease    Dyspnea    Essential hypertension    Heart disease    Heart murmur    History of gout    Hypothyroidism    Obesity (BMI 30-39.9) 11/11/2017   OSA (obstructive sleep apnea) 02/18/2017    severe obstructive sleep apnea with an AHI of 75.6/h and mild central sleep apnea with a CAI of 7.7/h.  Oxygen saturations dropped as low as 82%.   He is on CPAP at 9 cm H2O.   OSA on CPAP    Personal history of chemotherapy    Personal history of radiation therapy    Presence of permanent cardiac pacemaker    Primary localized osteoarthritis of right knee 05/13/2018   Refusal of blood transfusions as patient is Jehovah's Witness    Type II diabetes mellitus ([place])    Current Outpatient Medications  Medication Sig Dispense Refill   acetaminophen (TYLENOL) 500 MG tablet Take 500-1,000 mg by mouth every 6 (six) hours as needed (for pain or headaches).     allopurinol (ZYLOPRIM) 100 MG tablet Take 100 mg by mouth daily.     apixaban (ELIQUIS) 5 MG TABS tablet Take 1 tablet (5 mg total) by mouth 2 (two) times daily. 60 tablet 11   atorvastatin (LIPITOR) 80 MG tablet Take 1 tablet (80 mg total) by mouth at bedtime. 90 tablet 3   Biotin 10 MG TABS Take 10 mg by mouth daily.     Blood Glucose Monitoring Suppl (ONETOUCH VERIO) w/Device KIT Use to check blood sugar 1 kit 0   carvedilol (COREG) 3.125 MG tablet Take 1 tablet (3.125 mg total) by mouth 2 (two) times daily with a meal. 60 tablet 6   celecoxib (CELEBREX) 100 MG capsule Take 100 mg by mouth 2 (two) times daily.     clotrimazole-betamethasone (LOTRISONE) lotion Apply 1 application topically 2 (two) times daily as needed (itching).     Continuous  Blood Gluc Sensor (DEXCOM G6 SENSOR) MISC 1 Device by Does not apply route as directed. 9 each 3   Continuous Blood Gluc Transmit (DEXCOM G6 TRANSMITTER) MISC 1 Device by Does not apply route as directed. 1 each 3   empagliflozin (JARDIANCE) 10 MG TABS tablet Take 10 mg by mouth daily.     Ferrous Sulfate (IRON) 325 (65 Fe) MG TABS Take 1 tablet (325 mg total) by mouth 2 (two) times daily before a meal. (Patient taking differently: Take 1 tablet by mouth every other day.) 30 tablet    folic  acid (FOLVITE) 1 MG tablet Take 1 tablet (1 mg total) by mouth daily. 30 tablet 2   gabapentin (NEURONTIN) 300 MG capsule Take 300 mg by mouth 2 (two) times daily.     Insulin Glargine (BASAGLAR KWIKPEN) 100 UNIT/ML Inject 45 Units into the skin at bedtime. 45 mL 0   insulin lispro (HUMALOG) 100 UNIT/ML injection Inject 0.05 mLs (5 Units total) into the skin 3 (three) times daily before meals. 10 mL 0   latanoprost (XALATAN) 0.005 % ophthalmic solution Place 1 drop into both eyes at bedtime.     levothyroxine (SYNTHROID, LEVOTHROID) 75 MCG tablet Take 75 mcg by mouth daily before breakfast.     losartan (COZAAR) 25 MG tablet Take 1 tablet (25 mg total) by mouth daily. 30 tablet 6   LUMIGAN 0.01 % SOLN Place 1 drop into both eyes at bedtime.     Multiple Vitamins-Minerals (ALIVE ONCE DAILY WOMENS 50+ PO) Take 1 tablet by mouth daily.     spironolactone (ALDACTONE) 25 MG tablet Take 0.5 tablets (12.5 mg total) by mouth daily. 90 tablet 0   torsemide (DEMADEX) 20 MG tablet Take 1 tablet (20 mg total) by mouth as needed. (Patient taking differently: Take 20 mg by mouth daily as needed (edema).) 30 tablet 11   Travoprost, BAK Free, (TRAVATAN) 0.004 % SOLN ophthalmic solution Place 1 drop into both eyes at bedtime.     No current facility-administered medications for this encounter.   BP 110/78   Pulse 82   Wt 93 kg (205 lb)   SpO2 98%   BMI 35.19 kg/m   Wt Readings from Last 3 Encounters:  02/21/22 93 kg  (205 lb)  02/15/22 91.2 kg (201 lb)  02/13/22 93.4 kg (206 lb)   PHYSICAL EXAM: General:  NAD. No resp difficulty, walked into clinic with RW. Sitting on exam table drinking botle of water HEENT: normal Neck: supple. no JVD. Carotids 2+ bilat; no bruits. No lymphadenopathy or thryomegaly appreciated. Cor: PMI nondisplaced. Regular rate & rhythm. No rubs, gallops or murmurs. Lungs: clear Abdomen: soft, nontender, nondistended. No hepatosplenomegaly. No bruits or masses. Good bowel sounds. Extremities: no cyanosis, clubbing, rash,  wrapped 1+ edema 0n R (TKR leg) trace edema on left Neuro: alert & orientedx3, cranial nerves grossly intact. moves all 4 extremities w/o difficulty. Affect pleasant   Device interrogation (personally reviewed): < 1% v-pacing, no AF, 0.8 hr/day activity. Personally reviewed   ASSESSMENT & PLAN:  1. Chronic systolic CHF: ICM - Echo (12/21): EF 45-50% G2DD  - Echo (2/23): EF 40-45%  - Stable NYHA II. Volume looks good with recent restart of torsemide 20 daily - Continue torsemide 20 daily - Continue spiro 12.5 mg daily. - Continue losartan 25 mg daily.  - Continue carvedilol 3.125 mg bid. - Continue Jardiance 10 mg daily.  2. CAD:  - Stenting in 2010, 2012 and 2016.  - No s/s ischemia - Continue statin.  - No ASA with Eliquis.   3. Paroxysmal Afib - In NSR today - Continue Eliquis 5 mg bid. No bleeding. - Hgb 10.6 on labs 10/13/21. (She is Jehovah's Witness)   4. HTN - BP looks good - Continue current medications.    5. DM - Per Endocrine. On insulin. - Continue Jardiance.    6. H/o sinus pauses s/p PPM - Dr. Curt Bears now following.  - No change.    7. OSA - Continue CPAP nightly.  - Follows with Dr. Radford Pax.   8. CKD 3b -  Baseline SCr ~ 1.4. - Continue Jardiance. - labs today   9. Right knee prosthetic join infection - s/p replacement 12/20/21 - resolved   Glori Bickers, MD  11:51 AM

## 2022-02-21 NOTE — Patient Instructions (Signed)
There has been no changes to your medications.   Labs done today, your results will be available in MyChart, we will contact you for abnormal readings.  Your physician has requested that you have an echocardiogram. Echocardiography is a painless test that uses sound waves to create images of your heart. It provides your doctor with information about the size and shape of your heart and how well your heart's chambers and valves are working. This procedure takes approximately one hour. There are no restrictions for this procedure.  Your physician recommends that you schedule a follow-up appointment in: 3 months  If you have any questions or concerns before your next appointment please send us a message through mychart or call our office at [phone].    TO LEAVE A MESSAGE FOR THE NURSE SELECT OPTION 2, PLEASE LEAVE A MESSAGE INCLUDING: YOUR NAME DATE OF BIRTH CALL BACK NUMBER REASON FOR CALL**this is important as we prioritize the call backs  YOU WILL RECEIVE A CALL BACK THE SAME DAY AS LONG AS YOU CALL BEFORE 4:00 PM  At the [facility], you and your health needs are our priority. As part of our continuing mission to provide you with exceptional heart care, we have created designated Provider Care Teams. These Care Teams include your primary Cardiologist (physician) and Advanced Practice Providers (APPs- Physician Assistants and Nurse Practitioners) who all work together to provide you with the care you need, when you need it.   You may see any of the following providers on your designated Care Team at your next follow up: Dr Daniel Bensimhon Dr Dalton McLean Dr. Aditya Sabharwal [name] Clegg, NP Brittainy Simmons, PA Jessica Milford,NP Lindsay Finch, PA Alma Diaz, NP Lauren Kemp, PharmD   Please be sure to bring in all your medications bottles to every appointment.    

## 2022-02-22 DIAGNOSIS — E039 Hypothyroidism, unspecified: Secondary | ICD-10-CM | POA: Diagnosis not present

## 2022-02-22 DIAGNOSIS — Z9181 History of falling: Secondary | ICD-10-CM | POA: Diagnosis not present

## 2022-02-22 DIAGNOSIS — Z96651 Presence of right artificial knee joint: Secondary | ICD-10-CM | POA: Diagnosis not present

## 2022-02-22 DIAGNOSIS — G4733 Obstructive sleep apnea (adult) (pediatric): Secondary | ICD-10-CM | POA: Diagnosis not present

## 2022-02-22 DIAGNOSIS — I2510 Atherosclerotic heart disease of native coronary artery without angina pectoris: Secondary | ICD-10-CM | POA: Diagnosis not present

## 2022-02-22 DIAGNOSIS — Z993 Dependence on wheelchair: Secondary | ICD-10-CM | POA: Diagnosis not present

## 2022-02-22 DIAGNOSIS — T84092D Other mechanical complication of internal right knee prosthesis, subsequent encounter: Secondary | ICD-10-CM | POA: Diagnosis not present

## 2022-02-22 DIAGNOSIS — E1122 Type 2 diabetes mellitus with diabetic chronic kidney disease: Secondary | ICD-10-CM | POA: Diagnosis not present

## 2022-02-22 DIAGNOSIS — Z950 Presence of cardiac pacemaker: Secondary | ICD-10-CM | POA: Diagnosis not present

## 2022-02-22 DIAGNOSIS — M1990 Unspecified osteoarthritis, unspecified site: Secondary | ICD-10-CM | POA: Diagnosis not present

## 2022-02-22 DIAGNOSIS — Z86718 Personal history of other venous thrombosis and embolism: Secondary | ICD-10-CM | POA: Diagnosis not present

## 2022-02-22 DIAGNOSIS — I110 Hypertensive heart disease with heart failure: Secondary | ICD-10-CM | POA: Diagnosis not present

## 2022-02-22 DIAGNOSIS — E113413 Type 2 diabetes mellitus with severe nonproliferative diabetic retinopathy with macular edema, bilateral: Secondary | ICD-10-CM | POA: Diagnosis not present

## 2022-02-22 DIAGNOSIS — E1142 Type 2 diabetes mellitus with diabetic polyneuropathy: Secondary | ICD-10-CM | POA: Diagnosis not present

## 2022-02-22 DIAGNOSIS — Z794 Long term (current) use of insulin: Secondary | ICD-10-CM | POA: Diagnosis not present

## 2022-02-22 DIAGNOSIS — Z853 Personal history of malignant neoplasm of breast: Secondary | ICD-10-CM | POA: Diagnosis not present

## 2022-02-22 DIAGNOSIS — I5042 Chronic combined systolic (congestive) and diastolic (congestive) heart failure: Secondary | ICD-10-CM | POA: Diagnosis not present

## 2022-02-22 DIAGNOSIS — I480 Paroxysmal atrial fibrillation: Secondary | ICD-10-CM | POA: Diagnosis not present

## 2022-02-22 DIAGNOSIS — N183 Chronic kidney disease, stage 3 unspecified: Secondary | ICD-10-CM | POA: Diagnosis not present

## 2022-02-22 DIAGNOSIS — T8453XD Infection and inflammatory reaction due to internal right knee prosthesis, subsequent encounter: Secondary | ICD-10-CM | POA: Diagnosis not present

## 2022-02-23 DIAGNOSIS — G4733 Obstructive sleep apnea (adult) (pediatric): Secondary | ICD-10-CM | POA: Diagnosis not present

## 2022-02-26 DIAGNOSIS — M1990 Unspecified osteoarthritis, unspecified site: Secondary | ICD-10-CM | POA: Diagnosis not present

## 2022-02-26 DIAGNOSIS — I480 Paroxysmal atrial fibrillation: Secondary | ICD-10-CM | POA: Diagnosis not present

## 2022-02-26 DIAGNOSIS — G4733 Obstructive sleep apnea (adult) (pediatric): Secondary | ICD-10-CM | POA: Diagnosis not present

## 2022-02-26 DIAGNOSIS — Z96651 Presence of right artificial knee joint: Secondary | ICD-10-CM | POA: Diagnosis not present

## 2022-02-26 DIAGNOSIS — E1122 Type 2 diabetes mellitus with diabetic chronic kidney disease: Secondary | ICD-10-CM | POA: Diagnosis not present

## 2022-02-26 DIAGNOSIS — E039 Hypothyroidism, unspecified: Secondary | ICD-10-CM | POA: Diagnosis not present

## 2022-02-26 DIAGNOSIS — I2510 Atherosclerotic heart disease of native coronary artery without angina pectoris: Secondary | ICD-10-CM | POA: Diagnosis not present

## 2022-02-26 DIAGNOSIS — Z853 Personal history of malignant neoplasm of breast: Secondary | ICD-10-CM | POA: Diagnosis not present

## 2022-02-26 DIAGNOSIS — Z86718 Personal history of other venous thrombosis and embolism: Secondary | ICD-10-CM | POA: Diagnosis not present

## 2022-02-26 DIAGNOSIS — N183 Chronic kidney disease, stage 3 unspecified: Secondary | ICD-10-CM | POA: Diagnosis not present

## 2022-02-26 DIAGNOSIS — Z950 Presence of cardiac pacemaker: Secondary | ICD-10-CM | POA: Diagnosis not present

## 2022-02-26 DIAGNOSIS — I110 Hypertensive heart disease with heart failure: Secondary | ICD-10-CM | POA: Diagnosis not present

## 2022-02-26 DIAGNOSIS — I5042 Chronic combined systolic (congestive) and diastolic (congestive) heart failure: Secondary | ICD-10-CM | POA: Diagnosis not present

## 2022-02-26 DIAGNOSIS — T84092D Other mechanical complication of internal right knee prosthesis, subsequent encounter: Secondary | ICD-10-CM | POA: Diagnosis not present

## 2022-02-26 DIAGNOSIS — Z794 Long term (current) use of insulin: Secondary | ICD-10-CM | POA: Diagnosis not present

## 2022-02-26 DIAGNOSIS — E1142 Type 2 diabetes mellitus with diabetic polyneuropathy: Secondary | ICD-10-CM | POA: Diagnosis not present

## 2022-02-26 DIAGNOSIS — Z993 Dependence on wheelchair: Secondary | ICD-10-CM | POA: Diagnosis not present

## 2022-02-26 DIAGNOSIS — Z9181 History of falling: Secondary | ICD-10-CM | POA: Diagnosis not present

## 2022-02-26 DIAGNOSIS — E113413 Type 2 diabetes mellitus with severe nonproliferative diabetic retinopathy with macular edema, bilateral: Secondary | ICD-10-CM | POA: Diagnosis not present

## 2022-02-28 ENCOUNTER — Other Ambulatory Visit (HOSPITAL_COMMUNITY): Payer: Self-pay

## 2022-02-28 DIAGNOSIS — I5042 Chronic combined systolic (congestive) and diastolic (congestive) heart failure: Secondary | ICD-10-CM | POA: Diagnosis not present

## 2022-02-28 DIAGNOSIS — Z9181 History of falling: Secondary | ICD-10-CM | POA: Diagnosis not present

## 2022-02-28 DIAGNOSIS — E113413 Type 2 diabetes mellitus with severe nonproliferative diabetic retinopathy with macular edema, bilateral: Secondary | ICD-10-CM | POA: Diagnosis not present

## 2022-02-28 DIAGNOSIS — M1990 Unspecified osteoarthritis, unspecified site: Secondary | ICD-10-CM | POA: Diagnosis not present

## 2022-02-28 DIAGNOSIS — Z950 Presence of cardiac pacemaker: Secondary | ICD-10-CM | POA: Diagnosis not present

## 2022-02-28 DIAGNOSIS — Z794 Long term (current) use of insulin: Secondary | ICD-10-CM | POA: Diagnosis not present

## 2022-02-28 DIAGNOSIS — T84092D Other mechanical complication of internal right knee prosthesis, subsequent encounter: Secondary | ICD-10-CM | POA: Diagnosis not present

## 2022-02-28 DIAGNOSIS — Z853 Personal history of malignant neoplasm of breast: Secondary | ICD-10-CM | POA: Diagnosis not present

## 2022-02-28 DIAGNOSIS — E1142 Type 2 diabetes mellitus with diabetic polyneuropathy: Secondary | ICD-10-CM | POA: Diagnosis not present

## 2022-02-28 DIAGNOSIS — Z993 Dependence on wheelchair: Secondary | ICD-10-CM | POA: Diagnosis not present

## 2022-02-28 DIAGNOSIS — I110 Hypertensive heart disease with heart failure: Secondary | ICD-10-CM | POA: Diagnosis not present

## 2022-02-28 DIAGNOSIS — I480 Paroxysmal atrial fibrillation: Secondary | ICD-10-CM | POA: Diagnosis not present

## 2022-02-28 DIAGNOSIS — Z96651 Presence of right artificial knee joint: Secondary | ICD-10-CM | POA: Diagnosis not present

## 2022-02-28 DIAGNOSIS — E1122 Type 2 diabetes mellitus with diabetic chronic kidney disease: Secondary | ICD-10-CM | POA: Diagnosis not present

## 2022-02-28 DIAGNOSIS — Z86718 Personal history of other venous thrombosis and embolism: Secondary | ICD-10-CM | POA: Diagnosis not present

## 2022-02-28 DIAGNOSIS — N183 Chronic kidney disease, stage 3 unspecified: Secondary | ICD-10-CM | POA: Diagnosis not present

## 2022-02-28 DIAGNOSIS — I2510 Atherosclerotic heart disease of native coronary artery without angina pectoris: Secondary | ICD-10-CM | POA: Diagnosis not present

## 2022-02-28 DIAGNOSIS — E039 Hypothyroidism, unspecified: Secondary | ICD-10-CM | POA: Diagnosis not present

## 2022-02-28 DIAGNOSIS — G4733 Obstructive sleep apnea (adult) (pediatric): Secondary | ICD-10-CM | POA: Diagnosis not present

## 2022-02-28 NOTE — Telephone Encounter (Signed)
Saw Danielle [name] in the home today- she successfully obtained her CPAP machine with no issues.   [name], [place] 02/28/2022

## 2022-02-28 NOTE — Progress Notes (Signed)
Paramedicine Encounter    Patient ID: Danielle [name], female    DOB: 1945/10/20, 76 y.o.   MRN: [id number]   Arrived for home visit for [place] who reports to be feeling okay but has some right knee pain. She reports she has been completed her physical therapy and the Tylenol and Celebrex aren't enough for her pain. We called her ortho office and left a message for the nurse to follow up.   I obtained vitals: WT- 198lbs BP- 108/60 HR- 70 O2-93% RR-16 CBG- 161   Minimal lower leg edema, no shortness of breath, lungs clear. She denied chest pain, dizziness or swelling other than her right knee around her surgery site.   I reviewed meds and confirmed same filling pill box for one week.   Refills: Gabapentin  I reviewed upcoming appointments and confirmed same. We discussed HF management and diet/fluid restrictions.   She got her CPAP delivered successfully and is using it nightly and reports it has helped her get some good sleep lately.   Home visit complete. I will see her in one week.   [name], [place] 02/28/2022     Patient Care Team: Seward Carol, MD as PCP - General (Internal Medicine) Constance Haw, MD as PCP - Electrophysiology (Cardiology) Sueanne Margarita, MD as PCP - Sleep Medicine (Cardiology) Bensimhon, Shaune Pascal, MD as PCP - Cardiology (Cardiology) Jorge Ny, LCSW as Social Worker (Licensed Clinical Social Worker) Shamleffer, Melanie Crazier, MD as Consulting Physician (Endocrinology)  Patient Active Problem List   Diagnosis Date Noted   Type 2 diabetes mellitus with stage 3a chronic kidney disease, with long-term current use of insulin ([place]) 02/16/2022   Prosthetic joint implant failure, initial encounter ([place]) 12/20/2021   Pseudophakia of both eyes 11/15/2021   DVT (deep venous thrombosis) ([place]) 07/31/2021   Acute blood loss anemia    Septic Arthritis, Infection of prosthetic right knee joint ([place]), MSSA infection  78/29/5621   Complication of internal right knee prosthesis ([place]) 07/10/2021   CAD (coronary artery disease) 06/08/2021   Carotid artery disease ([place]) 06/08/2021   Symptomatic bradycardia 06/08/2021   Paroxysmal atrial fibrillation ([place]) 06/08/2021   CKD (chronic kidney disease), stage III ([place]) 06/08/2021   Polyneuropathy associated with underlying disease ([place]) 05/04/2020   Severe nonproliferative diabetic retinopathy of left eye, with macular edema, associated with type 2 diabetes mellitus ([place]) 11/05/2019   Severe nonproliferative diabetic retinopathy of right eye, with macular edema, associated with type 2 diabetes mellitus ([place]) 11/05/2019   Retinal hemorrhage of right eye 11/05/2019   Retinal hemorrhage of left eye 11/05/2019   Diabetes mellitus ([place]) 05/14/2019   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, with long-term current use of insulin ([place]) 05/14/2019   Type 2 diabetes mellitus with diabetic polyneuropathy, with long-term current use of insulin ([place]) 05/14/2019   History of gout 05/20/2018   Primary localized osteoarthritis of right knee 05/13/2018   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) 12/20/2017   Obesity (BMI 30-39.9) 11/11/2017   OSA (obstructive sleep apnea) 02/18/2017   Essential hypertension    L Breast Cancer    Hypothyroidism     Current Outpatient Medications:    acetaminophen (TYLENOL) 500 MG tablet, Take 500-1,000 mg by mouth every 6 (six) hours as needed (for pain or headaches)., Disp: , Rfl:    allopurinol (ZYLOPRIM) 100 MG tablet, Take 100 mg by mouth daily., Disp: , Rfl:    apixaban (ELIQUIS) 5 MG TABS tablet, Take 1 tablet (5  mg total) by mouth 2 (two) times daily., Disp: 60 tablet, Rfl: 11   atorvastatin (LIPITOR) 80 MG tablet, Take 1 tablet (80 mg total) by mouth at bedtime., Disp: 90 tablet, Rfl: 3   Biotin 10 MG TABS, Take 10 mg by mouth daily., Disp: , Rfl:    Blood Glucose Monitoring Suppl (ONETOUCH VERIO) w/Device KIT, Use  to check blood sugar, Disp: 1 kit, Rfl: 0   carvedilol (COREG) 3.125 MG tablet, Take 1 tablet (3.125 mg total) by mouth 2 (two) times daily with a meal., Disp: 60 tablet, Rfl: 6   celecoxib (CELEBREX) 100 MG capsule, Take 100 mg by mouth 2 (two) times daily., Disp: , Rfl:    clotrimazole-betamethasone (LOTRISONE) lotion, Apply 1 application topically 2 (two) times daily as needed (itching)., Disp: , Rfl:    Continuous Blood Gluc Sensor (DEXCOM G6 SENSOR) MISC, 1 Device by Does not apply route as directed., Disp: 9 each, Rfl: 3   Continuous Blood Gluc Transmit (DEXCOM G6 TRANSMITTER) MISC, 1 Device by Does not apply route as directed., Disp: 1 each, Rfl: 3   empagliflozin (JARDIANCE) 10 MG TABS tablet, Take 10 mg by mouth daily., Disp: , Rfl:    Ferrous Sulfate (IRON) 325 (65 Fe) MG TABS, Take 1 tablet (325 mg total) by mouth 2 (two) times daily before a meal., Disp: 180 tablet, Rfl: 3   folic acid (FOLVITE) 1 MG tablet, Take 1 tablet (1 mg total) by mouth daily., Disp: 30 tablet, Rfl: 2   gabapentin (NEURONTIN) 300 MG capsule, Take 300 mg by mouth 2 (two) times daily., Disp: , Rfl:    Insulin Glargine (BASAGLAR KWIKPEN) 100 UNIT/ML, Inject 45 Units into the skin at bedtime., Disp: 45 mL, Rfl: 0   insulin lispro (HUMALOG) 100 UNIT/ML injection, Inject 0.05 mLs (5 Units total) into the skin 3 (three) times daily before meals., Disp: 10 mL, Rfl: 0   latanoprost (XALATAN) 0.005 % ophthalmic solution, Place 1 drop into both eyes at bedtime., Disp: , Rfl:    levothyroxine (SYNTHROID, LEVOTHROID) 75 MCG tablet, Take 75 mcg by mouth daily before breakfast., Disp: , Rfl:    losartan (COZAAR) 25 MG tablet, Take 1 tablet (25 mg total) by mouth daily., Disp: 30 tablet, Rfl: 6   LUMIGAN 0.01 % SOLN, Place 1 drop into both eyes at bedtime., Disp: , Rfl:    Multiple Vitamins-Minerals (ALIVE ONCE DAILY WOMENS 50+ PO), Take 1 tablet by mouth daily., Disp: , Rfl:    spironolactone (ALDACTONE) 25 MG tablet, Take 0.5  tablets (12.5 mg total) by mouth daily., Disp: 90 tablet, Rfl: 0   torsemide (DEMADEX) 20 MG tablet, Take 1 tablet (20 mg total) by mouth as needed. (Patient taking differently: Take 20 mg by mouth daily as needed (edema).), Disp: 30 tablet, Rfl: 11   Travoprost, BAK Free, (TRAVATAN) 0.004 % SOLN ophthalmic solution, Place 1 drop into both eyes at bedtime., Disp: , Rfl:  Allergies  Allergen Reactions   Codeine Other (See Comments)   Other Other (See Comments)    Blood Product Refusal (Jehovah's witness)     Tape Other (See Comments)    Leaves blisters and marks on the skin   Sulfa Antibiotics Rash   Uloric [Febuxostat] Rash     Social History   Socioeconomic History   Marital status: Widowed    Spouse name: Not on file   Number of children: 3   Years of education: Not on file   Highest education level: Some college,  no degree  Occupational History   Occupation: retired  Tobacco Use   Smoking status: Former    Packs/day: 1.00    Years: 22.00    Total pack years: 22.00    Types: Cigarettes    Quit date: 70    Years since quitting: 40.7   Smokeless tobacco: Never  Vaping Use   Vaping Use: Never used  Substance and Sexual Activity   Alcohol use: Yes    Comment: 05/22/2018 "glass of wine couple times/wk"   Drug use: Not Currently   Sexual activity: Not Currently    Birth control/protection: None, Post-menopausal  Other Topics Concern   Not on file  Social History Narrative   Not on file   Social Determinants of Health   Financial Resource Strain: Medium Risk (12/22/2021)   Overall Financial Resource Strain (CARDIA)    Difficulty of Paying Living Expenses: Somewhat hard  Food Insecurity: No Food Insecurity (05/08/2018)   Hunger Vital Sign    Worried About Running Out of Food in the Last Year: Never true    Ran Out of Food in the Last Year: Never true  Transportation Needs: No Transportation Needs (05/08/2018)   PRAPARE - Hydrologist  (Medical): No    Lack of Transportation (Non-Medical): No  Physical Activity: Not on file  Stress: Not on file  Social Connections: Not on file  Intimate Partner Violence: Not on file    Physical Exam      Future Appointments  Date Time Provider [place]  05/02/2022  7:00 AM [facility] DEVICE REMOTES [facility]  05/08/2022  9:05 AM [facility] ECHO/[facility] OP [facility]  05/08/2022 11:00 AM [facility] PA/NP [facility] None  08/16/2022 10:30 AM Shamleffer, Melanie Crazier, MD [facility] None     ACTION: Home visit completed

## 2022-03-01 DIAGNOSIS — E113413 Type 2 diabetes mellitus with severe nonproliferative diabetic retinopathy with macular edema, bilateral: Secondary | ICD-10-CM | POA: Diagnosis not present

## 2022-03-01 DIAGNOSIS — Z794 Long term (current) use of insulin: Secondary | ICD-10-CM | POA: Diagnosis not present

## 2022-03-01 DIAGNOSIS — Z950 Presence of cardiac pacemaker: Secondary | ICD-10-CM | POA: Diagnosis not present

## 2022-03-01 DIAGNOSIS — G4733 Obstructive sleep apnea (adult) (pediatric): Secondary | ICD-10-CM | POA: Diagnosis not present

## 2022-03-01 DIAGNOSIS — E1142 Type 2 diabetes mellitus with diabetic polyneuropathy: Secondary | ICD-10-CM | POA: Diagnosis not present

## 2022-03-01 DIAGNOSIS — Z993 Dependence on wheelchair: Secondary | ICD-10-CM | POA: Diagnosis not present

## 2022-03-01 DIAGNOSIS — Z853 Personal history of malignant neoplasm of breast: Secondary | ICD-10-CM | POA: Diagnosis not present

## 2022-03-01 DIAGNOSIS — Z9181 History of falling: Secondary | ICD-10-CM | POA: Diagnosis not present

## 2022-03-01 DIAGNOSIS — T84092D Other mechanical complication of internal right knee prosthesis, subsequent encounter: Secondary | ICD-10-CM | POA: Diagnosis not present

## 2022-03-01 DIAGNOSIS — E039 Hypothyroidism, unspecified: Secondary | ICD-10-CM | POA: Diagnosis not present

## 2022-03-01 DIAGNOSIS — I5042 Chronic combined systolic (congestive) and diastolic (congestive) heart failure: Secondary | ICD-10-CM | POA: Diagnosis not present

## 2022-03-01 DIAGNOSIS — Z86718 Personal history of other venous thrombosis and embolism: Secondary | ICD-10-CM | POA: Diagnosis not present

## 2022-03-01 DIAGNOSIS — N183 Chronic kidney disease, stage 3 unspecified: Secondary | ICD-10-CM | POA: Diagnosis not present

## 2022-03-01 DIAGNOSIS — I480 Paroxysmal atrial fibrillation: Secondary | ICD-10-CM | POA: Diagnosis not present

## 2022-03-01 DIAGNOSIS — I2510 Atherosclerotic heart disease of native coronary artery without angina pectoris: Secondary | ICD-10-CM | POA: Diagnosis not present

## 2022-03-01 DIAGNOSIS — I110 Hypertensive heart disease with heart failure: Secondary | ICD-10-CM | POA: Diagnosis not present

## 2022-03-01 DIAGNOSIS — E1122 Type 2 diabetes mellitus with diabetic chronic kidney disease: Secondary | ICD-10-CM | POA: Diagnosis not present

## 2022-03-01 DIAGNOSIS — M1990 Unspecified osteoarthritis, unspecified site: Secondary | ICD-10-CM | POA: Diagnosis not present

## 2022-03-01 DIAGNOSIS — Z96651 Presence of right artificial knee joint: Secondary | ICD-10-CM | POA: Diagnosis not present

## 2022-03-02 DIAGNOSIS — E1142 Type 2 diabetes mellitus with diabetic polyneuropathy: Secondary | ICD-10-CM | POA: Diagnosis not present

## 2022-03-02 DIAGNOSIS — I5042 Chronic combined systolic (congestive) and diastolic (congestive) heart failure: Secondary | ICD-10-CM | POA: Diagnosis not present

## 2022-03-02 DIAGNOSIS — I480 Paroxysmal atrial fibrillation: Secondary | ICD-10-CM | POA: Diagnosis not present

## 2022-03-02 DIAGNOSIS — I110 Hypertensive heart disease with heart failure: Secondary | ICD-10-CM | POA: Diagnosis not present

## 2022-03-02 DIAGNOSIS — E1122 Type 2 diabetes mellitus with diabetic chronic kidney disease: Secondary | ICD-10-CM | POA: Diagnosis not present

## 2022-03-02 DIAGNOSIS — Z853 Personal history of malignant neoplasm of breast: Secondary | ICD-10-CM | POA: Diagnosis not present

## 2022-03-02 DIAGNOSIS — M1990 Unspecified osteoarthritis, unspecified site: Secondary | ICD-10-CM | POA: Diagnosis not present

## 2022-03-02 DIAGNOSIS — E039 Hypothyroidism, unspecified: Secondary | ICD-10-CM | POA: Diagnosis not present

## 2022-03-02 DIAGNOSIS — E113413 Type 2 diabetes mellitus with severe nonproliferative diabetic retinopathy with macular edema, bilateral: Secondary | ICD-10-CM | POA: Diagnosis not present

## 2022-03-02 DIAGNOSIS — G4733 Obstructive sleep apnea (adult) (pediatric): Secondary | ICD-10-CM | POA: Diagnosis not present

## 2022-03-02 DIAGNOSIS — Z9181 History of falling: Secondary | ICD-10-CM | POA: Diagnosis not present

## 2022-03-02 DIAGNOSIS — Z950 Presence of cardiac pacemaker: Secondary | ICD-10-CM | POA: Diagnosis not present

## 2022-03-02 DIAGNOSIS — Z794 Long term (current) use of insulin: Secondary | ICD-10-CM | POA: Diagnosis not present

## 2022-03-02 DIAGNOSIS — Z86718 Personal history of other venous thrombosis and embolism: Secondary | ICD-10-CM | POA: Diagnosis not present

## 2022-03-02 DIAGNOSIS — I2510 Atherosclerotic heart disease of native coronary artery without angina pectoris: Secondary | ICD-10-CM | POA: Diagnosis not present

## 2022-03-02 DIAGNOSIS — Z993 Dependence on wheelchair: Secondary | ICD-10-CM | POA: Diagnosis not present

## 2022-03-02 DIAGNOSIS — Z96651 Presence of right artificial knee joint: Secondary | ICD-10-CM | POA: Diagnosis not present

## 2022-03-02 DIAGNOSIS — T84092D Other mechanical complication of internal right knee prosthesis, subsequent encounter: Secondary | ICD-10-CM | POA: Diagnosis not present

## 2022-03-02 DIAGNOSIS — N183 Chronic kidney disease, stage 3 unspecified: Secondary | ICD-10-CM | POA: Diagnosis not present

## 2022-03-05 DIAGNOSIS — Z950 Presence of cardiac pacemaker: Secondary | ICD-10-CM | POA: Diagnosis not present

## 2022-03-05 DIAGNOSIS — I5022 Chronic systolic (congestive) heart failure: Secondary | ICD-10-CM | POA: Diagnosis not present

## 2022-03-05 DIAGNOSIS — H6093 Unspecified otitis externa, bilateral: Secondary | ICD-10-CM | POA: Diagnosis not present

## 2022-03-05 DIAGNOSIS — Z0000 Encounter for general adult medical examination without abnormal findings: Secondary | ICD-10-CM | POA: Diagnosis not present

## 2022-03-05 DIAGNOSIS — I2510 Atherosclerotic heart disease of native coronary artery without angina pectoris: Secondary | ICD-10-CM | POA: Diagnosis not present

## 2022-03-05 DIAGNOSIS — Z23 Encounter for immunization: Secondary | ICD-10-CM | POA: Diagnosis not present

## 2022-03-05 DIAGNOSIS — E113413 Type 2 diabetes mellitus with severe nonproliferative diabetic retinopathy with macular edema, bilateral: Secondary | ICD-10-CM | POA: Diagnosis not present

## 2022-03-05 DIAGNOSIS — M109 Gout, unspecified: Secondary | ICD-10-CM | POA: Diagnosis not present

## 2022-03-05 DIAGNOSIS — I10 Essential (primary) hypertension: Secondary | ICD-10-CM | POA: Diagnosis not present

## 2022-03-05 DIAGNOSIS — N1832 Chronic kidney disease, stage 3b: Secondary | ICD-10-CM | POA: Diagnosis not present

## 2022-03-05 DIAGNOSIS — I480 Paroxysmal atrial fibrillation: Secondary | ICD-10-CM | POA: Diagnosis not present

## 2022-03-05 DIAGNOSIS — I739 Peripheral vascular disease, unspecified: Secondary | ICD-10-CM | POA: Diagnosis not present

## 2022-03-05 DIAGNOSIS — Z1331 Encounter for screening for depression: Secondary | ICD-10-CM | POA: Diagnosis not present

## 2022-03-05 DIAGNOSIS — Z96659 Presence of unspecified artificial knee joint: Secondary | ICD-10-CM | POA: Diagnosis not present

## 2022-03-06 ENCOUNTER — Encounter: Payer: Self-pay | Admitting: Internal Medicine

## 2022-03-06 DIAGNOSIS — I5042 Chronic combined systolic (congestive) and diastolic (congestive) heart failure: Secondary | ICD-10-CM | POA: Diagnosis not present

## 2022-03-06 DIAGNOSIS — M1990 Unspecified osteoarthritis, unspecified site: Secondary | ICD-10-CM | POA: Diagnosis not present

## 2022-03-06 DIAGNOSIS — E113413 Type 2 diabetes mellitus with severe nonproliferative diabetic retinopathy with macular edema, bilateral: Secondary | ICD-10-CM | POA: Diagnosis not present

## 2022-03-06 DIAGNOSIS — Z86718 Personal history of other venous thrombosis and embolism: Secondary | ICD-10-CM | POA: Diagnosis not present

## 2022-03-06 DIAGNOSIS — G4733 Obstructive sleep apnea (adult) (pediatric): Secondary | ICD-10-CM | POA: Diagnosis not present

## 2022-03-06 DIAGNOSIS — I110 Hypertensive heart disease with heart failure: Secondary | ICD-10-CM | POA: Diagnosis not present

## 2022-03-06 DIAGNOSIS — T84092D Other mechanical complication of internal right knee prosthesis, subsequent encounter: Secondary | ICD-10-CM | POA: Diagnosis not present

## 2022-03-06 DIAGNOSIS — Z794 Long term (current) use of insulin: Secondary | ICD-10-CM | POA: Diagnosis not present

## 2022-03-06 DIAGNOSIS — E1142 Type 2 diabetes mellitus with diabetic polyneuropathy: Secondary | ICD-10-CM | POA: Diagnosis not present

## 2022-03-06 DIAGNOSIS — N183 Chronic kidney disease, stage 3 unspecified: Secondary | ICD-10-CM | POA: Diagnosis not present

## 2022-03-06 DIAGNOSIS — E1122 Type 2 diabetes mellitus with diabetic chronic kidney disease: Secondary | ICD-10-CM | POA: Diagnosis not present

## 2022-03-06 DIAGNOSIS — Z96651 Presence of right artificial knee joint: Secondary | ICD-10-CM | POA: Diagnosis not present

## 2022-03-06 DIAGNOSIS — I480 Paroxysmal atrial fibrillation: Secondary | ICD-10-CM | POA: Diagnosis not present

## 2022-03-06 DIAGNOSIS — Z853 Personal history of malignant neoplasm of breast: Secondary | ICD-10-CM | POA: Diagnosis not present

## 2022-03-06 DIAGNOSIS — Z993 Dependence on wheelchair: Secondary | ICD-10-CM | POA: Diagnosis not present

## 2022-03-06 DIAGNOSIS — Z9181 History of falling: Secondary | ICD-10-CM | POA: Diagnosis not present

## 2022-03-06 DIAGNOSIS — I2510 Atherosclerotic heart disease of native coronary artery without angina pectoris: Secondary | ICD-10-CM | POA: Diagnosis not present

## 2022-03-06 DIAGNOSIS — Z950 Presence of cardiac pacemaker: Secondary | ICD-10-CM | POA: Diagnosis not present

## 2022-03-06 DIAGNOSIS — E039 Hypothyroidism, unspecified: Secondary | ICD-10-CM | POA: Diagnosis not present

## 2022-03-07 ENCOUNTER — Other Ambulatory Visit (HOSPITAL_COMMUNITY): Payer: Self-pay

## 2022-03-07 DIAGNOSIS — I2510 Atherosclerotic heart disease of native coronary artery without angina pectoris: Secondary | ICD-10-CM | POA: Diagnosis not present

## 2022-03-07 DIAGNOSIS — M1990 Unspecified osteoarthritis, unspecified site: Secondary | ICD-10-CM | POA: Diagnosis not present

## 2022-03-07 DIAGNOSIS — E1142 Type 2 diabetes mellitus with diabetic polyneuropathy: Secondary | ICD-10-CM | POA: Diagnosis not present

## 2022-03-07 DIAGNOSIS — Z853 Personal history of malignant neoplasm of breast: Secondary | ICD-10-CM | POA: Diagnosis not present

## 2022-03-07 DIAGNOSIS — E113413 Type 2 diabetes mellitus with severe nonproliferative diabetic retinopathy with macular edema, bilateral: Secondary | ICD-10-CM | POA: Diagnosis not present

## 2022-03-07 DIAGNOSIS — Z9181 History of falling: Secondary | ICD-10-CM | POA: Diagnosis not present

## 2022-03-07 DIAGNOSIS — E1122 Type 2 diabetes mellitus with diabetic chronic kidney disease: Secondary | ICD-10-CM | POA: Diagnosis not present

## 2022-03-07 DIAGNOSIS — Z86718 Personal history of other venous thrombosis and embolism: Secondary | ICD-10-CM | POA: Diagnosis not present

## 2022-03-07 DIAGNOSIS — Z96651 Presence of right artificial knee joint: Secondary | ICD-10-CM | POA: Diagnosis not present

## 2022-03-07 DIAGNOSIS — N183 Chronic kidney disease, stage 3 unspecified: Secondary | ICD-10-CM | POA: Diagnosis not present

## 2022-03-07 DIAGNOSIS — Z950 Presence of cardiac pacemaker: Secondary | ICD-10-CM | POA: Diagnosis not present

## 2022-03-07 DIAGNOSIS — I5042 Chronic combined systolic (congestive) and diastolic (congestive) heart failure: Secondary | ICD-10-CM | POA: Diagnosis not present

## 2022-03-07 DIAGNOSIS — T84092D Other mechanical complication of internal right knee prosthesis, subsequent encounter: Secondary | ICD-10-CM | POA: Diagnosis not present

## 2022-03-07 DIAGNOSIS — E039 Hypothyroidism, unspecified: Secondary | ICD-10-CM | POA: Diagnosis not present

## 2022-03-07 DIAGNOSIS — I110 Hypertensive heart disease with heart failure: Secondary | ICD-10-CM | POA: Diagnosis not present

## 2022-03-07 DIAGNOSIS — I480 Paroxysmal atrial fibrillation: Secondary | ICD-10-CM | POA: Diagnosis not present

## 2022-03-07 DIAGNOSIS — Z993 Dependence on wheelchair: Secondary | ICD-10-CM | POA: Diagnosis not present

## 2022-03-07 DIAGNOSIS — Z794 Long term (current) use of insulin: Secondary | ICD-10-CM | POA: Diagnosis not present

## 2022-03-07 DIAGNOSIS — G4733 Obstructive sleep apnea (adult) (pediatric): Secondary | ICD-10-CM | POA: Diagnosis not present

## 2022-03-07 NOTE — Progress Notes (Signed)
Paramedicine Encounter    Patient ID: Danielle [name], female    DOB: [birth year], 76 y.o.   MRN: [id number]   Arrived for home visit for [place] who reports to be feeling good today. She denied shortness of breath, dizziness, chest pain or increased weight gain. She says her weight is stable at 198lbs which is around her goal weight. Her swelling has decreased since using Torsemide daily however she is dealing with constipation and reports she has not had a solid BM in three days. She says she is not hydrating like she should be so I encouraged her to stay around the 64-68oz mark and she reports right now she is around 50oz daily. We discussed med compliance as well because her pill box noted she had missed a few evening doses over the last week. She reports she had been falling asleep earlier in the day and forgetting to take them. I advised her the importance to stay on her medication schedule. She verbalized agreement and says she will improve.   I refilled pill box for one week. Refills as noted called into [facility] for pickup by Friday: Gabapentin Losartan Folic Acid   Vitals as noted: WT- 198lbs BP- 106/68 HR- 69 RR- 18 O2-93% CBG- 184  Swelling- decreased but still present, encouraged her to pick up compression socks ASAP.  Lungs- clear  She reports she is exercising with PT and OT 2 times weekly and is walking around her apartment complex using her walker.   Dexcom sensor replaced.   She has labs with PCP tomorrow, I advised her to have them fax over to HF clinic also. She agreed.   We reviewed other upcoming appointments and she agreed with plans.   I will see Raquel in one week.   Home visit complete.   [name], [place] 03/07/2022   Patient Care Team: Seward Carol, MD as PCP - General (Internal Medicine) Constance Haw, MD as PCP - Electrophysiology (Cardiology) Sueanne Margarita, MD as PCP - Sleep Medicine (Cardiology) Bensimhon,  Shaune Pascal, MD as PCP - Cardiology (Cardiology) Jorge Ny, LCSW as Social Worker (Licensed Clinical Social Worker) Shamleffer, Melanie Crazier, MD as Consulting Physician (Endocrinology)  Patient Active Problem List   Diagnosis Date Noted   Type 2 diabetes mellitus with stage 3a chronic kidney disease, with long-term current use of insulin ([place]) 02/16/2022   Prosthetic joint implant failure, initial encounter ([place]) 12/20/2021   Pseudophakia of both eyes 11/15/2021   DVT (deep venous thrombosis) ([place]) 07/31/2021   Acute blood loss anemia    Septic Arthritis, Infection of prosthetic right knee joint ([place]), MSSA infection 32/20/2542   Complication of internal right knee prosthesis ([place]) 07/10/2021   CAD (coronary artery disease) 06/08/2021   Carotid artery disease ([place]) 06/08/2021   Symptomatic bradycardia 06/08/2021   Paroxysmal atrial fibrillation ([place]) 06/08/2021   CKD (chronic kidney disease), stage III ([place]) 06/08/2021   Polyneuropathy associated with underlying disease ([place]) 05/04/2020   Severe nonproliferative diabetic retinopathy of left eye, with macular edema, associated with type 2 diabetes mellitus ([place]) 11/05/2019   Severe nonproliferative diabetic retinopathy of right eye, with macular edema, associated with type 2 diabetes mellitus ([place]) 11/05/2019   Retinal hemorrhage of right eye 11/05/2019   Retinal hemorrhage of left eye 11/05/2019   Diabetes mellitus ([place]) 05/14/2019   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, with long-term current use of insulin ([place]) 05/14/2019   Type 2 diabetes mellitus with diabetic polyneuropathy, with long-term current use of  insulin ([place]) 05/14/2019   History of gout 05/20/2018   Primary localized osteoarthritis of right knee 05/13/2018   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) 12/20/2017   Obesity (BMI 30-39.9) 11/11/2017   OSA (obstructive sleep apnea) 02/18/2017   Essential hypertension    L Breast Cancer     Hypothyroidism     Current Outpatient Medications:    acetaminophen (TYLENOL) 500 MG tablet, Take 500-1,000 mg by mouth every 6 (six) hours as needed (for pain or headaches)., Disp: , Rfl:    allopurinol (ZYLOPRIM) 100 MG tablet, Take 100 mg by mouth daily., Disp: , Rfl:    apixaban (ELIQUIS) 5 MG TABS tablet, Take 1 tablet (5 mg total) by mouth 2 (two) times daily., Disp: 60 tablet, Rfl: 11   atorvastatin (LIPITOR) 80 MG tablet, Take 1 tablet (80 mg total) by mouth at bedtime., Disp: 90 tablet, Rfl: 3   Biotin 10 MG TABS, Take 10 mg by mouth daily., Disp: , Rfl:    Blood Glucose Monitoring Suppl (ONETOUCH VERIO) w/Device KIT, Use to check blood sugar, Disp: 1 kit, Rfl: 0   carvedilol (COREG) 3.125 MG tablet, Take 1 tablet (3.125 mg total) by mouth 2 (two) times daily with a meal., Disp: 60 tablet, Rfl: 6   celecoxib (CELEBREX) 100 MG capsule, Take 100 mg by mouth 2 (two) times daily., Disp: , Rfl:    clotrimazole-betamethasone (LOTRISONE) lotion, Apply 1 application topically 2 (two) times daily as needed (itching)., Disp: , Rfl:    Continuous Blood Gluc Sensor (DEXCOM G6 SENSOR) MISC, 1 Device by Does not apply route as directed., Disp: 9 each, Rfl: 3   Continuous Blood Gluc Transmit (DEXCOM G6 TRANSMITTER) MISC, 1 Device by Does not apply route as directed., Disp: 1 each, Rfl: 3   empagliflozin (JARDIANCE) 10 MG TABS tablet, Take 10 mg by mouth daily., Disp: , Rfl:    Ferrous Sulfate (IRON) 325 (65 Fe) MG TABS, Take 1 tablet (325 mg total) by mouth 2 (two) times daily before a meal., Disp: 180 tablet, Rfl: 3   folic acid (FOLVITE) 1 MG tablet, Take 1 tablet (1 mg total) by mouth daily., Disp: 30 tablet, Rfl: 2   gabapentin (NEURONTIN) 300 MG capsule, Take 300 mg by mouth 2 (two) times daily., Disp: , Rfl:    Insulin Glargine (BASAGLAR KWIKPEN) 100 UNIT/ML, Inject 45 Units into the skin at bedtime., Disp: 45 mL, Rfl: 0   insulin lispro (HUMALOG) 100 UNIT/ML injection, Inject 0.05 mLs (5 Units  total) into the skin 3 (three) times daily before meals., Disp: 10 mL, Rfl: 0   latanoprost (XALATAN) 0.005 % ophthalmic solution, Place 1 drop into both eyes at bedtime., Disp: , Rfl:    levothyroxine (SYNTHROID, LEVOTHROID) 75 MCG tablet, Take 75 mcg by mouth daily before breakfast., Disp: , Rfl:    losartan (COZAAR) 25 MG tablet, Take 1 tablet (25 mg total) by mouth daily., Disp: 30 tablet, Rfl: 6   LUMIGAN 0.01 % SOLN, Place 1 drop into both eyes at bedtime., Disp: , Rfl:    Multiple Vitamins-Minerals (ALIVE ONCE DAILY WOMENS 50+ PO), Take 1 tablet by mouth daily., Disp: , Rfl:    spironolactone (ALDACTONE) 25 MG tablet, Take 0.5 tablets (12.5 mg total) by mouth daily., Disp: 90 tablet, Rfl: 0   torsemide (DEMADEX) 20 MG tablet, Take 1 tablet (20 mg total) by mouth as needed. (Patient taking differently: Take 20 mg by mouth daily as needed (edema).), Disp: 30 tablet, Rfl: 11  Travoprost, BAK Free, (TRAVATAN) 0.004 % SOLN ophthalmic solution, Place 1 drop into both eyes at bedtime., Disp: , Rfl:  Allergies  Allergen Reactions   Codeine Other (See Comments)   Other Other (See Comments)    Blood Product Refusal (Jehovah's witness)     Tape Other (See Comments)    Leaves blisters and marks on the skin   Sulfa Antibiotics Rash   Uloric [Febuxostat] Rash     Social History   Socioeconomic History   Marital status: Widowed    Spouse name: Not on file   Number of children: 3   Years of education: Not on file   Highest education level: Some college, no degree  Occupational History   Occupation: retired  Tobacco Use   Smoking status: Former    Packs/day: 1.00    Years: 22.00    Total pack years: 22.00    Types: Cigarettes    Quit date: 1983    Years since quitting: 40.8   Smokeless tobacco: Never  Vaping Use   Vaping Use: Never used  Substance and Sexual Activity   Alcohol use: Yes    Comment: 05/22/2018 "glass of wine couple times/wk"   Drug use: Not Currently   Sexual  activity: Not Currently    Birth control/protection: None, Post-menopausal  Other Topics Concern   Not on file  Social History Narrative   Not on file   Social Determinants of Health   Financial Resource Strain: Medium Risk (12/22/2021)   Overall Financial Resource Strain (CARDIA)    Difficulty of Paying Living Expenses: Somewhat hard  Food Insecurity: No Food Insecurity (05/08/2018)   Hunger Vital Sign    Worried About Running Out of Food in the Last Year: Never true    [place] in the Last Year: Never true  Transportation Needs: No Transportation Needs (05/08/2018)   PRAPARE - Hydrologist (Medical): No    Lack of Transportation (Non-Medical): No  Physical Activity: Not on file  Stress: Not on file  Social Connections: Not on file  Intimate Partner Violence: Not on file    Physical Exam      Future Appointments  Date Time Provider [place]  05/02/2022  7:00 AM [facility] DEVICE REMOTES [facility]  05/08/2022  9:05 AM MC ECHO/CH OP [facility]  05/08/2022 11:00 AM [facility] PA/NP [facility] None  08/16/2022 10:30 AM Shamleffer, Melanie Crazier, MD [facility] None     ACTION: Home visit completed

## 2022-03-09 DIAGNOSIS — Z96651 Presence of right artificial knee joint: Secondary | ICD-10-CM | POA: Diagnosis not present

## 2022-03-09 DIAGNOSIS — T84092D Other mechanical complication of internal right knee prosthesis, subsequent encounter: Secondary | ICD-10-CM | POA: Diagnosis not present

## 2022-03-09 DIAGNOSIS — Z86718 Personal history of other venous thrombosis and embolism: Secondary | ICD-10-CM | POA: Diagnosis not present

## 2022-03-09 DIAGNOSIS — I2510 Atherosclerotic heart disease of native coronary artery without angina pectoris: Secondary | ICD-10-CM | POA: Diagnosis not present

## 2022-03-09 DIAGNOSIS — Z950 Presence of cardiac pacemaker: Secondary | ICD-10-CM | POA: Diagnosis not present

## 2022-03-09 DIAGNOSIS — I480 Paroxysmal atrial fibrillation: Secondary | ICD-10-CM | POA: Diagnosis not present

## 2022-03-09 DIAGNOSIS — Z9181 History of falling: Secondary | ICD-10-CM | POA: Diagnosis not present

## 2022-03-09 DIAGNOSIS — E039 Hypothyroidism, unspecified: Secondary | ICD-10-CM | POA: Diagnosis not present

## 2022-03-09 DIAGNOSIS — M1990 Unspecified osteoarthritis, unspecified site: Secondary | ICD-10-CM | POA: Diagnosis not present

## 2022-03-09 DIAGNOSIS — E1122 Type 2 diabetes mellitus with diabetic chronic kidney disease: Secondary | ICD-10-CM | POA: Diagnosis not present

## 2022-03-09 DIAGNOSIS — G4733 Obstructive sleep apnea (adult) (pediatric): Secondary | ICD-10-CM | POA: Diagnosis not present

## 2022-03-09 DIAGNOSIS — E113413 Type 2 diabetes mellitus with severe nonproliferative diabetic retinopathy with macular edema, bilateral: Secondary | ICD-10-CM | POA: Diagnosis not present

## 2022-03-09 DIAGNOSIS — Z993 Dependence on wheelchair: Secondary | ICD-10-CM | POA: Diagnosis not present

## 2022-03-09 DIAGNOSIS — N183 Chronic kidney disease, stage 3 unspecified: Secondary | ICD-10-CM | POA: Diagnosis not present

## 2022-03-09 DIAGNOSIS — Z794 Long term (current) use of insulin: Secondary | ICD-10-CM | POA: Diagnosis not present

## 2022-03-09 DIAGNOSIS — I110 Hypertensive heart disease with heart failure: Secondary | ICD-10-CM | POA: Diagnosis not present

## 2022-03-09 DIAGNOSIS — E7800 Pure hypercholesterolemia, unspecified: Secondary | ICD-10-CM | POA: Diagnosis not present

## 2022-03-09 DIAGNOSIS — E1142 Type 2 diabetes mellitus with diabetic polyneuropathy: Secondary | ICD-10-CM | POA: Diagnosis not present

## 2022-03-09 DIAGNOSIS — Z853 Personal history of malignant neoplasm of breast: Secondary | ICD-10-CM | POA: Diagnosis not present

## 2022-03-09 DIAGNOSIS — I5042 Chronic combined systolic (congestive) and diastolic (congestive) heart failure: Secondary | ICD-10-CM | POA: Diagnosis not present

## 2022-03-12 DIAGNOSIS — E1122 Type 2 diabetes mellitus with diabetic chronic kidney disease: Secondary | ICD-10-CM | POA: Diagnosis not present

## 2022-03-12 DIAGNOSIS — I110 Hypertensive heart disease with heart failure: Secondary | ICD-10-CM | POA: Diagnosis not present

## 2022-03-12 DIAGNOSIS — E113413 Type 2 diabetes mellitus with severe nonproliferative diabetic retinopathy with macular edema, bilateral: Secondary | ICD-10-CM | POA: Diagnosis not present

## 2022-03-12 DIAGNOSIS — Z993 Dependence on wheelchair: Secondary | ICD-10-CM | POA: Diagnosis not present

## 2022-03-12 DIAGNOSIS — Z96651 Presence of right artificial knee joint: Secondary | ICD-10-CM | POA: Diagnosis not present

## 2022-03-12 DIAGNOSIS — Z86718 Personal history of other venous thrombosis and embolism: Secondary | ICD-10-CM | POA: Diagnosis not present

## 2022-03-12 DIAGNOSIS — Z9181 History of falling: Secondary | ICD-10-CM | POA: Diagnosis not present

## 2022-03-12 DIAGNOSIS — Z950 Presence of cardiac pacemaker: Secondary | ICD-10-CM | POA: Diagnosis not present

## 2022-03-12 DIAGNOSIS — E1142 Type 2 diabetes mellitus with diabetic polyneuropathy: Secondary | ICD-10-CM | POA: Diagnosis not present

## 2022-03-12 DIAGNOSIS — I5042 Chronic combined systolic (congestive) and diastolic (congestive) heart failure: Secondary | ICD-10-CM | POA: Diagnosis not present

## 2022-03-12 DIAGNOSIS — G4733 Obstructive sleep apnea (adult) (pediatric): Secondary | ICD-10-CM | POA: Diagnosis not present

## 2022-03-12 DIAGNOSIS — E1165 Type 2 diabetes mellitus with hyperglycemia: Secondary | ICD-10-CM | POA: Diagnosis not present

## 2022-03-12 DIAGNOSIS — E039 Hypothyroidism, unspecified: Secondary | ICD-10-CM | POA: Diagnosis not present

## 2022-03-12 DIAGNOSIS — Z794 Long term (current) use of insulin: Secondary | ICD-10-CM | POA: Diagnosis not present

## 2022-03-12 DIAGNOSIS — N183 Chronic kidney disease, stage 3 unspecified: Secondary | ICD-10-CM | POA: Diagnosis not present

## 2022-03-12 DIAGNOSIS — Z853 Personal history of malignant neoplasm of breast: Secondary | ICD-10-CM | POA: Diagnosis not present

## 2022-03-12 DIAGNOSIS — I2510 Atherosclerotic heart disease of native coronary artery without angina pectoris: Secondary | ICD-10-CM | POA: Diagnosis not present

## 2022-03-12 DIAGNOSIS — T84092D Other mechanical complication of internal right knee prosthesis, subsequent encounter: Secondary | ICD-10-CM | POA: Diagnosis not present

## 2022-03-12 DIAGNOSIS — M1990 Unspecified osteoarthritis, unspecified site: Secondary | ICD-10-CM | POA: Diagnosis not present

## 2022-03-12 DIAGNOSIS — I480 Paroxysmal atrial fibrillation: Secondary | ICD-10-CM | POA: Diagnosis not present

## 2022-03-13 ENCOUNTER — Other Ambulatory Visit (HOSPITAL_COMMUNITY): Payer: Self-pay

## 2022-03-13 NOTE — Progress Notes (Signed)
Paramedicine Encounter    Patient ID: Danielle [name], female    DOB: March 12, 1946, 76 y.o.   MRN: [id number]   Arrived to see [place] for a med rec today where we reviewed meds and I filled pill box for one week. She has been more compliant over the last week with her medications only missing Friday and Saturday evening doses. She picked up all refills except her Gabapentin which [facility] reports they need a new RX for- I will call PCP tomorrow for same.   I reviewed upcoming appointments and confirmed same. She had no concerns today and reports swelling is down and she is feeling good with no chest pain, dizziness or shortness of breath. Home visit complete. I will see [place] in one week.   [name], [place] 03/13/2022     ACTION: Home visit completed

## 2022-03-14 DIAGNOSIS — E7800 Pure hypercholesterolemia, unspecified: Secondary | ICD-10-CM | POA: Diagnosis not present

## 2022-03-14 DIAGNOSIS — I480 Paroxysmal atrial fibrillation: Secondary | ICD-10-CM | POA: Diagnosis not present

## 2022-03-14 DIAGNOSIS — Z993 Dependence on wheelchair: Secondary | ICD-10-CM | POA: Diagnosis not present

## 2022-03-14 DIAGNOSIS — N183 Chronic kidney disease, stage 3 unspecified: Secondary | ICD-10-CM | POA: Diagnosis not present

## 2022-03-14 DIAGNOSIS — Z9181 History of falling: Secondary | ICD-10-CM | POA: Diagnosis not present

## 2022-03-14 DIAGNOSIS — E113413 Type 2 diabetes mellitus with severe nonproliferative diabetic retinopathy with macular edema, bilateral: Secondary | ICD-10-CM | POA: Diagnosis not present

## 2022-03-14 DIAGNOSIS — Z86718 Personal history of other venous thrombosis and embolism: Secondary | ICD-10-CM | POA: Diagnosis not present

## 2022-03-14 DIAGNOSIS — I5020 Unspecified systolic (congestive) heart failure: Secondary | ICD-10-CM | POA: Diagnosis not present

## 2022-03-14 DIAGNOSIS — I110 Hypertensive heart disease with heart failure: Secondary | ICD-10-CM | POA: Diagnosis not present

## 2022-03-14 DIAGNOSIS — T84092D Other mechanical complication of internal right knee prosthesis, subsequent encounter: Secondary | ICD-10-CM | POA: Diagnosis not present

## 2022-03-14 DIAGNOSIS — I10 Essential (primary) hypertension: Secondary | ICD-10-CM | POA: Diagnosis not present

## 2022-03-14 DIAGNOSIS — G4733 Obstructive sleep apnea (adult) (pediatric): Secondary | ICD-10-CM | POA: Diagnosis not present

## 2022-03-14 DIAGNOSIS — E1142 Type 2 diabetes mellitus with diabetic polyneuropathy: Secondary | ICD-10-CM | POA: Diagnosis not present

## 2022-03-14 DIAGNOSIS — Z794 Long term (current) use of insulin: Secondary | ICD-10-CM | POA: Diagnosis not present

## 2022-03-14 DIAGNOSIS — E039 Hypothyroidism, unspecified: Secondary | ICD-10-CM | POA: Diagnosis not present

## 2022-03-14 DIAGNOSIS — Z950 Presence of cardiac pacemaker: Secondary | ICD-10-CM | POA: Diagnosis not present

## 2022-03-14 DIAGNOSIS — M1990 Unspecified osteoarthritis, unspecified site: Secondary | ICD-10-CM | POA: Diagnosis not present

## 2022-03-14 DIAGNOSIS — I5042 Chronic combined systolic (congestive) and diastolic (congestive) heart failure: Secondary | ICD-10-CM | POA: Diagnosis not present

## 2022-03-14 DIAGNOSIS — N1832 Chronic kidney disease, stage 3b: Secondary | ICD-10-CM | POA: Diagnosis not present

## 2022-03-14 DIAGNOSIS — Z853 Personal history of malignant neoplasm of breast: Secondary | ICD-10-CM | POA: Diagnosis not present

## 2022-03-14 DIAGNOSIS — Z96651 Presence of right artificial knee joint: Secondary | ICD-10-CM | POA: Diagnosis not present

## 2022-03-14 DIAGNOSIS — I2510 Atherosclerotic heart disease of native coronary artery without angina pectoris: Secondary | ICD-10-CM | POA: Diagnosis not present

## 2022-03-14 DIAGNOSIS — E1122 Type 2 diabetes mellitus with diabetic chronic kidney disease: Secondary | ICD-10-CM | POA: Diagnosis not present

## 2022-03-19 DIAGNOSIS — I5042 Chronic combined systolic (congestive) and diastolic (congestive) heart failure: Secondary | ICD-10-CM | POA: Diagnosis not present

## 2022-03-19 DIAGNOSIS — E1142 Type 2 diabetes mellitus with diabetic polyneuropathy: Secondary | ICD-10-CM | POA: Diagnosis not present

## 2022-03-19 DIAGNOSIS — M1990 Unspecified osteoarthritis, unspecified site: Secondary | ICD-10-CM | POA: Diagnosis not present

## 2022-03-19 DIAGNOSIS — Z853 Personal history of malignant neoplasm of breast: Secondary | ICD-10-CM | POA: Diagnosis not present

## 2022-03-19 DIAGNOSIS — E113413 Type 2 diabetes mellitus with severe nonproliferative diabetic retinopathy with macular edema, bilateral: Secondary | ICD-10-CM | POA: Diagnosis not present

## 2022-03-19 DIAGNOSIS — Z993 Dependence on wheelchair: Secondary | ICD-10-CM | POA: Diagnosis not present

## 2022-03-19 DIAGNOSIS — I110 Hypertensive heart disease with heart failure: Secondary | ICD-10-CM | POA: Diagnosis not present

## 2022-03-19 DIAGNOSIS — Z86718 Personal history of other venous thrombosis and embolism: Secondary | ICD-10-CM | POA: Diagnosis not present

## 2022-03-19 DIAGNOSIS — E039 Hypothyroidism, unspecified: Secondary | ICD-10-CM | POA: Diagnosis not present

## 2022-03-19 DIAGNOSIS — Z96651 Presence of right artificial knee joint: Secondary | ICD-10-CM | POA: Diagnosis not present

## 2022-03-19 DIAGNOSIS — N183 Chronic kidney disease, stage 3 unspecified: Secondary | ICD-10-CM | POA: Diagnosis not present

## 2022-03-19 DIAGNOSIS — Z794 Long term (current) use of insulin: Secondary | ICD-10-CM | POA: Diagnosis not present

## 2022-03-19 DIAGNOSIS — I480 Paroxysmal atrial fibrillation: Secondary | ICD-10-CM | POA: Diagnosis not present

## 2022-03-19 DIAGNOSIS — G4733 Obstructive sleep apnea (adult) (pediatric): Secondary | ICD-10-CM | POA: Diagnosis not present

## 2022-03-19 DIAGNOSIS — I2510 Atherosclerotic heart disease of native coronary artery without angina pectoris: Secondary | ICD-10-CM | POA: Diagnosis not present

## 2022-03-19 DIAGNOSIS — T84092D Other mechanical complication of internal right knee prosthesis, subsequent encounter: Secondary | ICD-10-CM | POA: Diagnosis not present

## 2022-03-19 DIAGNOSIS — E1122 Type 2 diabetes mellitus with diabetic chronic kidney disease: Secondary | ICD-10-CM | POA: Diagnosis not present

## 2022-03-19 DIAGNOSIS — Z9181 History of falling: Secondary | ICD-10-CM | POA: Diagnosis not present

## 2022-03-19 DIAGNOSIS — Z950 Presence of cardiac pacemaker: Secondary | ICD-10-CM | POA: Diagnosis not present

## 2022-03-20 ENCOUNTER — Other Ambulatory Visit (HOSPITAL_COMMUNITY): Payer: Self-pay

## 2022-03-20 NOTE — Progress Notes (Signed)
Paramedicine Encounter    Patient ID: Danielle [name], female    DOB: [birth year], 76 y.o.   MRN: [id number]   Arrived for home visit for [place] who reports to be feeling okay today but having some lower back and knee pain today. She has been walking and exercising more often and feels sore from this. She is taking tylenol OTC and robaxin as needed for her back pain.   She denied shortness of breath, dizziness or chest pain. Swelling in lower legs present but no more than normal. Lungs clear on assessment. Vitals as noted:  WT- 198lbs BP- 110/68 HR- 68 O2- 97% RR- 16 CBG- 159   I placed new Dexcom on today.   I reviewed meds and filled pill box for one week. Called in refills needed to [facility] and got refills from Ortho for Celebrex sent into her pharmacy.   Refills: Celebrex Folic Acid Torsemide Levothyroxine Carvedilol Allupurinol   I reviewed upcoming appointments and confirmed same. We discussed overall health, DM and HF management.   Home visit complete. I will see Charliee in one week.   [name], [place] 03/20/2022    Patient Care Team: Seward Carol, MD as PCP - General (Internal Medicine) Constance Haw, MD as PCP - Electrophysiology (Cardiology) Sueanne Margarita, MD as PCP - Sleep Medicine (Cardiology) Bensimhon, Shaune Pascal, MD as PCP - Cardiology (Cardiology) Jorge Ny, LCSW as Social Worker (Licensed Clinical Social Worker) Shamleffer, Melanie Crazier, MD as Consulting Physician (Endocrinology)  Patient Active Problem List   Diagnosis Date Noted   Type 2 diabetes mellitus with stage 3a chronic kidney disease, with long-term current use of insulin ([place]) 02/16/2022   Prosthetic joint implant failure, initial encounter ([place]) 12/20/2021   Pseudophakia of both eyes 11/15/2021   DVT (deep venous thrombosis) ([place]) 07/31/2021   Acute blood loss anemia    Septic Arthritis, Infection of prosthetic right knee joint ([place]), MSSA  infection 66/29/4765   Complication of internal right knee prosthesis ([place]) 07/10/2021   CAD (coronary artery disease) 06/08/2021   Carotid artery disease ([place]) 06/08/2021   Symptomatic bradycardia 06/08/2021   Paroxysmal atrial fibrillation ([place]) 06/08/2021   CKD (chronic kidney disease), stage III ([place]) 06/08/2021   Polyneuropathy associated with underlying disease ([place]) 05/04/2020   Severe nonproliferative diabetic retinopathy of left eye, with macular edema, associated with type 2 diabetes mellitus ([place]) 11/05/2019   Severe nonproliferative diabetic retinopathy of right eye, with macular edema, associated with type 2 diabetes mellitus ([place]) 11/05/2019   Retinal hemorrhage of right eye 11/05/2019   Retinal hemorrhage of left eye 11/05/2019   Diabetes mellitus ([place]) 05/14/2019   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, with long-term current use of insulin ([place]) 05/14/2019   Type 2 diabetes mellitus with diabetic polyneuropathy, with long-term current use of insulin ([place]) 05/14/2019   History of gout 05/20/2018   Primary localized osteoarthritis of right knee 05/13/2018   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) 12/20/2017   Obesity (BMI 30-39.9) 11/11/2017   OSA (obstructive sleep apnea) 02/18/2017   Essential hypertension    L Breast Cancer    Hypothyroidism     Current Outpatient Medications:    acetaminophen (TYLENOL) 500 MG tablet, Take 500-1,000 mg by mouth every 6 (six) hours as needed (for pain or headaches)., Disp: , Rfl:    allopurinol (ZYLOPRIM) 100 MG tablet, Take 100 mg by mouth daily., Disp: , Rfl:    apixaban (ELIQUIS) 5 MG TABS tablet, Take 1 tablet (5  mg total) by mouth 2 (two) times daily., Disp: 60 tablet, Rfl: 11   atorvastatin (LIPITOR) 80 MG tablet, Take 1 tablet (80 mg total) by mouth at bedtime., Disp: 90 tablet, Rfl: 3   Biotin 10 MG TABS, Take 10 mg by mouth daily., Disp: , Rfl:    Blood Glucose Monitoring Suppl (ONETOUCH VERIO) w/Device  KIT, Use to check blood sugar, Disp: 1 kit, Rfl: 0   carvedilol (COREG) 3.125 MG tablet, Take 1 tablet (3.125 mg total) by mouth 2 (two) times daily with a meal., Disp: 60 tablet, Rfl: 6   celecoxib (CELEBREX) 100 MG capsule, Take 100 mg by mouth 2 (two) times daily. (Patient not taking: Reported on 03/07/2022), Disp: , Rfl:    clotrimazole-betamethasone (LOTRISONE) lotion, Apply 1 application topically 2 (two) times daily as needed (itching)., Disp: , Rfl:    Continuous Blood Gluc Sensor (DEXCOM G6 SENSOR) MISC, 1 Device by Does not apply route as directed., Disp: 9 each, Rfl: 3   Continuous Blood Gluc Transmit (DEXCOM G6 TRANSMITTER) MISC, 1 Device by Does not apply route as directed., Disp: 1 each, Rfl: 3   empagliflozin (JARDIANCE) 10 MG TABS tablet, Take 10 mg by mouth daily., Disp: , Rfl:    Ferrous Sulfate (IRON) 325 (65 Fe) MG TABS, Take 1 tablet (325 mg total) by mouth 2 (two) times daily before a meal., Disp: 180 tablet, Rfl: 3   folic acid (FOLVITE) 1 MG tablet, Take 1 tablet (1 mg total) by mouth daily., Disp: 30 tablet, Rfl: 2   gabapentin (NEURONTIN) 300 MG capsule, Take 300 mg by mouth 2 (two) times daily., Disp: , Rfl:    Insulin Glargine (BASAGLAR KWIKPEN) 100 UNIT/ML, Inject 45 Units into the skin at bedtime., Disp: 45 mL, Rfl: 0   insulin lispro (HUMALOG) 100 UNIT/ML injection, Inject 0.05 mLs (5 Units total) into the skin 3 (three) times daily before meals., Disp: 10 mL, Rfl: 0   latanoprost (XALATAN) 0.005 % ophthalmic solution, Place 1 drop into both eyes at bedtime., Disp: , Rfl:    levothyroxine (SYNTHROID, LEVOTHROID) 75 MCG tablet, Take 75 mcg by mouth daily before breakfast., Disp: , Rfl:    losartan (COZAAR) 25 MG tablet, Take 1 tablet (25 mg total) by mouth daily., Disp: 30 tablet, Rfl: 6   LUMIGAN 0.01 % SOLN, Place 1 drop into both eyes at bedtime., Disp: , Rfl:    Multiple Vitamins-Minerals (ALIVE ONCE DAILY WOMENS 50+ PO), Take 1 tablet by mouth daily., Disp: , Rfl:     spironolactone (ALDACTONE) 25 MG tablet, Take 0.5 tablets (12.5 mg total) by mouth daily., Disp: 90 tablet, Rfl: 0   torsemide (DEMADEX) 20 MG tablet, Take 1 tablet (20 mg total) by mouth as needed. (Patient taking differently: Take 20 mg by mouth daily as needed (edema).), Disp: 30 tablet, Rfl: 11   Travoprost, BAK Free, (TRAVATAN) 0.004 % SOLN ophthalmic solution, Place 1 drop into both eyes at bedtime., Disp: , Rfl:  Allergies  Allergen Reactions   Codeine Other (See Comments)   Other Other (See Comments)    Blood Product Refusal (Jehovah's witness)     Tape Other (See Comments)    Leaves blisters and marks on the skin   Sulfa Antibiotics Rash   Uloric [Febuxostat] Rash     Social History   Socioeconomic History   Marital status: Widowed    Spouse name: Not on file   Number of children: 3   Years of education: Not on file  Highest education level: Some college, no degree  Occupational History   Occupation: retired  Tobacco Use   Smoking status: Former    Packs/day: 1.00    Years: 22.00    Total pack years: 22.00    Types: Cigarettes    Quit date: 1983    Years since quitting: 40.8   Smokeless tobacco: Never  Vaping Use   Vaping Use: Never used  Substance and Sexual Activity   Alcohol use: Yes    Comment: 05/22/2018 "glass of wine couple times/wk"   Drug use: Not Currently   Sexual activity: Not Currently    Birth control/protection: None, Post-menopausal  Other Topics Concern   Not on file  Social History Narrative   Not on file   Social Determinants of Health   Financial Resource Strain: Medium Risk (12/22/2021)   Overall Financial Resource Strain (CARDIA)    Difficulty of Paying Living Expenses: Somewhat hard  Food Insecurity: No Food Insecurity (05/08/2018)   Hunger Vital Sign    Worried About Running Out of Food in the Last Year: Never true    Ran Out of Food in the Last Year: Never true  Transportation Needs: No Transportation Needs (05/08/2018)    PRAPARE - Hydrologist (Medical): No    Lack of Transportation (Non-Medical): No  Physical Activity: Not on file  Stress: Not on file  Social Connections: Not on file  Intimate Partner Violence: Not on file    Physical Exam      Future Appointments  Date Time Provider [place]  05/02/2022  7:00 AM [facility] DEVICE REMOTES [facility]  05/08/2022  9:05 AM [facility] ECHO/[facility] OP [facility]  05/08/2022 11:00 AM [facility] PA/NP [facility] None  08/16/2022 10:30 AM Shamleffer, Melanie Crazier, MD [facility] None     ACTION: Home visit completed

## 2022-03-27 ENCOUNTER — Other Ambulatory Visit (HOSPITAL_COMMUNITY): Payer: Self-pay

## 2022-03-27 DIAGNOSIS — Z993 Dependence on wheelchair: Secondary | ICD-10-CM | POA: Diagnosis not present

## 2022-03-27 DIAGNOSIS — T84092D Other mechanical complication of internal right knee prosthesis, subsequent encounter: Secondary | ICD-10-CM | POA: Diagnosis not present

## 2022-03-27 DIAGNOSIS — I110 Hypertensive heart disease with heart failure: Secondary | ICD-10-CM | POA: Diagnosis not present

## 2022-03-27 DIAGNOSIS — M1990 Unspecified osteoarthritis, unspecified site: Secondary | ICD-10-CM | POA: Diagnosis not present

## 2022-03-27 DIAGNOSIS — E113413 Type 2 diabetes mellitus with severe nonproliferative diabetic retinopathy with macular edema, bilateral: Secondary | ICD-10-CM | POA: Diagnosis not present

## 2022-03-27 DIAGNOSIS — E039 Hypothyroidism, unspecified: Secondary | ICD-10-CM | POA: Diagnosis not present

## 2022-03-27 DIAGNOSIS — I480 Paroxysmal atrial fibrillation: Secondary | ICD-10-CM | POA: Diagnosis not present

## 2022-03-27 DIAGNOSIS — Z86718 Personal history of other venous thrombosis and embolism: Secondary | ICD-10-CM | POA: Diagnosis not present

## 2022-03-27 DIAGNOSIS — Z950 Presence of cardiac pacemaker: Secondary | ICD-10-CM | POA: Diagnosis not present

## 2022-03-27 DIAGNOSIS — Z9181 History of falling: Secondary | ICD-10-CM | POA: Diagnosis not present

## 2022-03-27 DIAGNOSIS — Z794 Long term (current) use of insulin: Secondary | ICD-10-CM | POA: Diagnosis not present

## 2022-03-27 DIAGNOSIS — E1142 Type 2 diabetes mellitus with diabetic polyneuropathy: Secondary | ICD-10-CM | POA: Diagnosis not present

## 2022-03-27 DIAGNOSIS — Z96651 Presence of right artificial knee joint: Secondary | ICD-10-CM | POA: Diagnosis not present

## 2022-03-27 DIAGNOSIS — I2510 Atherosclerotic heart disease of native coronary artery without angina pectoris: Secondary | ICD-10-CM | POA: Diagnosis not present

## 2022-03-27 DIAGNOSIS — N183 Chronic kidney disease, stage 3 unspecified: Secondary | ICD-10-CM | POA: Diagnosis not present

## 2022-03-27 DIAGNOSIS — Z853 Personal history of malignant neoplasm of breast: Secondary | ICD-10-CM | POA: Diagnosis not present

## 2022-03-27 DIAGNOSIS — I5042 Chronic combined systolic (congestive) and diastolic (congestive) heart failure: Secondary | ICD-10-CM | POA: Diagnosis not present

## 2022-03-27 DIAGNOSIS — E1122 Type 2 diabetes mellitus with diabetic chronic kidney disease: Secondary | ICD-10-CM | POA: Diagnosis not present

## 2022-03-27 DIAGNOSIS — G4733 Obstructive sleep apnea (adult) (pediatric): Secondary | ICD-10-CM | POA: Diagnosis not present

## 2022-03-27 NOTE — Progress Notes (Signed)
Paramedicine Encounter    Patient ID: Danielle [name], female    DOB: November 05, 1945, 76 y.o.   MRN: [id number]   Arrived for home visit for [place] who reports to be feeling good today with no complaints. She states that physical therapy just left from her home and that it went well today. I obtained assessment and vitals as noted:  WT- 195lbs BP- 118/70 HR- 75 O2- 98% RR- 16  CBG- 181  Lungs- clear Edema- minimal swelling lower legs   I reviewed meds and filled pill box for one week with refills as noted: -Allupurinol -Dexcom transmitter   Danielle [name] has no upcoming appointments.   We discussed diabetic and HF education. She is doing well and reports that she has been feeling better over the last few weeks minus having trouble sleeping. She is having issues with dryness in her nose from her CPAP. I suggested some saline nasal spray nightly before using CPAP. She agreed with this plan to see if it helps.   Dexcom replaced and refill for transmitter called into [facility]. New transmitter will ship Weds Nov 15.   Home visit complete.  [name].   Danielle [name], [place] 03/27/2022     Patient Care Team: Seward Carol, MD as PCP - General (Internal Medicine) Constance Haw, MD as PCP - Electrophysiology (Cardiology) Sueanne Margarita, MD as PCP - Sleep Medicine (Cardiology) Bensimhon, Shaune Pascal, MD as PCP - Cardiology (Cardiology) Jorge Ny, LCSW as Social Worker (Licensed Clinical Social Worker) Shamleffer, Melanie Crazier, MD as Consulting Physician (Endocrinology)  Patient Active Problem List   Diagnosis Date Noted   Type 2 diabetes mellitus with stage 3a chronic kidney disease, with long-term current use of insulin ([place]) 02/16/2022   Prosthetic joint implant failure, initial encounter ([place]) 12/20/2021   Pseudophakia of both eyes 11/15/2021   DVT (deep venous thrombosis) ([place]) 07/31/2021   Acute blood loss anemia     Septic Arthritis, Infection of prosthetic right knee joint ([place]), MSSA infection 40/98/1191   Complication of internal right knee prosthesis ([place]) 07/10/2021   CAD (coronary artery disease) 06/08/2021   Carotid artery disease ([place]) 06/08/2021   Symptomatic bradycardia 06/08/2021   Paroxysmal atrial fibrillation ([place]) 06/08/2021   CKD (chronic kidney disease), stage III ([place]) 06/08/2021   Polyneuropathy associated with underlying disease ([place]) 05/04/2020   Severe nonproliferative diabetic retinopathy of left eye, with macular edema, associated with type 2 diabetes mellitus ([place]) 11/05/2019   Severe nonproliferative diabetic retinopathy of right eye, with macular edema, associated with type 2 diabetes mellitus ([place]) 11/05/2019   Retinal hemorrhage of right eye 11/05/2019   Retinal hemorrhage of left eye 11/05/2019   Diabetes mellitus ([place]) 05/14/2019   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, with long-term current use of insulin ([place]) 05/14/2019   Type 2 diabetes mellitus with diabetic polyneuropathy, with long-term current use of insulin ([place]) 05/14/2019   History of gout 05/20/2018   Primary localized osteoarthritis of right knee 05/13/2018   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) 12/20/2017   Obesity (BMI 30-39.9) 11/11/2017   OSA (obstructive sleep apnea) 02/18/2017   Essential hypertension    L Breast Cancer    Hypothyroidism     Current Outpatient Medications:    acetaminophen (TYLENOL) 500 MG tablet, Take 500-1,000 mg by mouth every 6 (six) hours as needed (for pain or headaches)., Disp: , Rfl:    allopurinol (ZYLOPRIM) 100 MG tablet, Take 100 mg by mouth  daily., Disp: , Rfl:    apixaban (ELIQUIS) 5 MG TABS tablet, Take 1 tablet (5 mg total) by mouth 2 (two) times daily., Disp: 60 tablet, Rfl: 11   atorvastatin (LIPITOR) 80 MG tablet, Take 1 tablet (80 mg total) by mouth at bedtime., Disp: 90 tablet, Rfl: 3   Biotin 10 MG TABS, Take 10 mg by mouth daily.,  Disp: , Rfl:    Blood Glucose Monitoring Suppl (ONETOUCH VERIO) w/Device KIT, Use to check blood sugar, Disp: 1 kit, Rfl: 0   carvedilol (COREG) 3.125 MG tablet, Take 1 tablet (3.125 mg total) by mouth 2 (two) times daily with a meal., Disp: 60 tablet, Rfl: 6   celecoxib (CELEBREX) 100 MG capsule, Take 100 mg by mouth 2 (two) times daily. (Patient not taking: Reported on 03/07/2022), Disp: , Rfl:    clotrimazole-betamethasone (LOTRISONE) lotion, Apply 1 application topically 2 (two) times daily as needed (itching)., Disp: , Rfl:    Continuous Blood Gluc Sensor (DEXCOM G6 SENSOR) MISC, 1 Device by Does not apply route as directed., Disp: 9 each, Rfl: 3   Continuous Blood Gluc Transmit (DEXCOM G6 TRANSMITTER) MISC, 1 Device by Does not apply route as directed., Disp: 1 each, Rfl: 3   empagliflozin (JARDIANCE) 10 MG TABS tablet, Take 10 mg by mouth daily., Disp: , Rfl:    Ferrous Sulfate (IRON) 325 (65 Fe) MG TABS, Take 1 tablet (325 mg total) by mouth 2 (two) times daily before a meal., Disp: 180 tablet, Rfl: 3   folic acid (FOLVITE) 1 MG tablet, Take 1 tablet (1 mg total) by mouth daily., Disp: 30 tablet, Rfl: 2   gabapentin (NEURONTIN) 300 MG capsule, Take 300 mg by mouth 2 (two) times daily., Disp: , Rfl:    Insulin Glargine (BASAGLAR KWIKPEN) 100 UNIT/ML, Inject 45 Units into the skin at bedtime., Disp: 45 mL, Rfl: 0   insulin lispro (HUMALOG) 100 UNIT/ML injection, Inject 0.05 mLs (5 Units total) into the skin 3 (three) times daily before meals., Disp: 10 mL, Rfl: 0   latanoprost (XALATAN) 0.005 % ophthalmic solution, Place 1 drop into both eyes at bedtime., Disp: , Rfl:    levothyroxine (SYNTHROID, LEVOTHROID) 75 MCG tablet, Take 75 mcg by mouth daily before breakfast., Disp: , Rfl:    losartan (COZAAR) 25 MG tablet, Take 1 tablet (25 mg total) by mouth daily., Disp: 30 tablet, Rfl: 6   LUMIGAN 0.01 % SOLN, Place 1 drop into both eyes at bedtime., Disp: , Rfl:    Multiple Vitamins-Minerals  (ALIVE ONCE DAILY WOMENS 50+ PO), Take 1 tablet by mouth daily., Disp: , Rfl:    spironolactone (ALDACTONE) 25 MG tablet, Take 0.5 tablets (12.5 mg total) by mouth daily., Disp: 90 tablet, Rfl: 0   torsemide (DEMADEX) 20 MG tablet, Take 1 tablet (20 mg total) by mouth as needed. (Patient taking differently: Take 20 mg by mouth daily as needed (edema).), Disp: 30 tablet, Rfl: 11   Travoprost, BAK Free, (TRAVATAN) 0.004 % SOLN ophthalmic solution, Place 1 drop into both eyes at bedtime., Disp: , Rfl:  Allergies  Allergen Reactions   Codeine Other (See Comments)   Other Other (See Comments)    Blood Product Refusal (Jehovah's witness)     Tape Other (See Comments)    Leaves blisters and marks on the skin   Sulfa Antibiotics Rash   Uloric [Febuxostat] Rash     Social History   Socioeconomic History   Marital status: Widowed    Spouse name: Not  on file   Number of children: 3   Years of education: Not on file   Highest education level: Some college, no degree  Occupational History   Occupation: retired  Tobacco Use   Smoking status: Former    Packs/day: 1.00    Years: 22.00    Total pack years: 22.00    Types: Cigarettes    Quit date: 1983    Years since quitting: 40.8   Smokeless tobacco: Never  Vaping Use   Vaping Use: Never used  Substance and Sexual Activity   Alcohol use: Yes    Comment: 05/22/2018 "glass of wine couple times/wk"   Drug use: Not Currently   Sexual activity: Not Currently    Birth control/protection: None, Post-menopausal  Other Topics Concern   Not on file  Social History Narrative   Not on file   Social Determinants of Health   Financial Resource Strain: Medium Risk (12/22/2021)   Overall Financial Resource Strain (CARDIA)    Difficulty of Paying Living Expenses: Somewhat hard  Food Insecurity: No Food Insecurity (05/08/2018)   Hunger Vital Sign    Worried About Running Out of Food in the Last Year: Never true    [place] in the Last  Year: Never true  Transportation Needs: No Transportation Needs (05/08/2018)   PRAPARE - Hydrologist (Medical): No    Lack of Transportation (Non-Medical): No  Physical Activity: Not on file  Stress: Not on file  Social Connections: Not on file  Intimate Partner Violence: Not on file    Physical Exam      Future Appointments  Date Time Provider [place]  05/02/2022  7:00 AM [facility] DEVICE REMOTES [facility]  05/08/2022  9:05 AM MC ECHO/CH OP [facility]  05/08/2022 11:00 AM [facility] PA/NP [facility] None  08/16/2022 10:30 AM Shamleffer, Melanie Crazier, MD [facility] None     ACTION: Home visit completed

## 2022-03-28 ENCOUNTER — Other Ambulatory Visit (HOSPITAL_COMMUNITY): Payer: Self-pay

## 2022-03-28 ENCOUNTER — Other Ambulatory Visit: Payer: Self-pay | Admitting: Internal Medicine

## 2022-03-28 ENCOUNTER — Telehealth (HOSPITAL_COMMUNITY): Payer: Self-pay

## 2022-03-28 ENCOUNTER — Other Ambulatory Visit (HOSPITAL_COMMUNITY): Payer: Self-pay | Admitting: *Deleted

## 2022-03-28 DIAGNOSIS — Z1231 Encounter for screening mammogram for malignant neoplasm of breast: Secondary | ICD-10-CM

## 2022-03-28 MED ORDER — EMPAGLIFLOZIN 10 MG PO TABS: 10.0000 mg | ORAL_TABLET | Freq: Every day | ORAL | 11 refills | Status: DC

## 2022-03-28 NOTE — Telephone Encounter (Signed)
Advanced Heart Failure Patient Advocate Encounter  Received notification that it is time to renew patient assistance for Jardiance [facility]). This patient may be eligible for a grant that would cover medication costs without needing to be approved for manufacturer assistance.  Patient has requested that I call back around 1 PM. Will update encounter.  Clista Bernhardt, CPhT Rx Patient Advocate Phone: [phone]

## 2022-03-28 NOTE — Telephone Encounter (Signed)
Advanced Heart Failure Patient Advocate Encounter  The patient was approved for a [employer] grant that will help cover the cost of Jardiance.  Total amount awarded, $10,000.  Effective: 02/26/2022 - 02/26/2023.  BIN [id number] PCN [id number] [id number] ID [id number]  Spoke to patient by phone, and mailed grant approval along with processing information to patients home address. New rx has been sent to patients preferred [facility].  Clista Bernhardt, CPhT Rx Patient Advocate Phone: [phone]

## 2022-04-03 ENCOUNTER — Other Ambulatory Visit (HOSPITAL_COMMUNITY): Payer: Self-pay

## 2022-04-03 NOTE — Progress Notes (Signed)
Paramedicine Encounter    Patient ID: Danielle [name], female    DOB: 20-May-1946, 76 y.o.   MRN: [id number]   Arrived for home visit for [facility] who reports feeling well with no complaints of shortness of breath but reports her weight is up 5lbs this week and she has a little extra swelling. She did miss two doses of her medications over the weekend. Today she took an extra Torsemide and reports she has been urinating well today and hopes her weight and swelling will decrease.   I reviewed meds and confirmed same filling pill box for one week. Refills as noted: -Allupurinol   I contacted [place] at [facility] to confirm they received the Rx for Jardiance and the grant information in which they did not receive. I gave them that information over the phone and it reduced her copay to $0. Saveah was very grateful for this help as she did now understand how to proceed with the grant information.   I plan to follow up on Monday in the home- she agreed with plan. Visit complete.   [name], [place] 04/03/2022     Patient Care Team: Seward Carol, MD as PCP - General (Internal Medicine) Constance Haw, MD as PCP - Electrophysiology (Cardiology) Sueanne Margarita, MD as PCP - Sleep Medicine (Cardiology) Bensimhon, Shaune Pascal, MD as PCP - Cardiology (Cardiology) Jorge Ny, LCSW as Social Worker (Licensed Clinical Social Worker) Shamleffer, Melanie Crazier, MD as Consulting Physician (Endocrinology)  Patient Active Problem List   Diagnosis Date Noted   Type 2 diabetes mellitus with stage 3a chronic kidney disease, with long-term current use of insulin ([place]) 02/16/2022   Prosthetic joint implant failure, initial encounter ([place]) 12/20/2021   Pseudophakia of both eyes 11/15/2021   DVT (deep venous thrombosis) ([place]) 07/31/2021   Acute blood loss anemia    Septic Arthritis, Infection of prosthetic right knee joint ([place]), MSSA infection 58/52/7782    Complication of internal right knee prosthesis ([place]) 07/10/2021   CAD (coronary artery disease) 06/08/2021   Carotid artery disease ([place]) 06/08/2021   Symptomatic bradycardia 06/08/2021   Paroxysmal atrial fibrillation ([place]) 06/08/2021   CKD (chronic kidney disease), stage III ([place]) 06/08/2021   Polyneuropathy associated with underlying disease ([place]) 05/04/2020   Severe nonproliferative diabetic retinopathy of left eye, with macular edema, associated with type 2 diabetes mellitus ([place]) 11/05/2019   Severe nonproliferative diabetic retinopathy of right eye, with macular edema, associated with type 2 diabetes mellitus ([place]) 11/05/2019   Retinal hemorrhage of right eye 11/05/2019   Retinal hemorrhage of left eye 11/05/2019   Diabetes mellitus ([place]) 05/14/2019   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, with long-term current use of insulin ([place]) 05/14/2019   Type 2 diabetes mellitus with diabetic polyneuropathy, with long-term current use of insulin ([place]) 05/14/2019   History of gout 05/20/2018   Primary localized osteoarthritis of right knee 05/13/2018   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) 12/20/2017   Obesity (BMI 30-39.9) 11/11/2017   OSA (obstructive sleep apnea) 02/18/2017   Essential hypertension    L Breast Cancer    Hypothyroidism     Current Outpatient Medications:    acetaminophen (TYLENOL) 500 MG tablet, Take 500-1,000 mg by mouth every 6 (six) hours as needed (for pain or headaches)., Disp: , Rfl:    allopurinol (ZYLOPRIM) 100 MG tablet, Take 100 mg by mouth daily., Disp: , Rfl:    apixaban (ELIQUIS) 5 MG TABS tablet, Take 1 tablet (5 mg total)  by mouth 2 (two) times daily., Disp: 60 tablet, Rfl: 11   atorvastatin (LIPITOR) 80 MG tablet, Take 1 tablet (80 mg total) by mouth at bedtime., Disp: 90 tablet, Rfl: 3   Biotin 10 MG TABS, Take 10 mg by mouth daily., Disp: , Rfl:    Blood Glucose Monitoring Suppl (ONETOUCH VERIO) w/Device KIT, Use to check blood  sugar, Disp: 1 kit, Rfl: 0   carvedilol (COREG) 3.125 MG tablet, Take 1 tablet (3.125 mg total) by mouth 2 (two) times daily with a meal., Disp: 60 tablet, Rfl: 6   celecoxib (CELEBREX) 100 MG capsule, Take 100 mg by mouth 2 (two) times daily. (Patient not taking: Reported on 03/07/2022), Disp: , Rfl:    clotrimazole-betamethasone (LOTRISONE) lotion, Apply 1 application topically 2 (two) times daily as needed (itching)., Disp: , Rfl:    Continuous Blood Gluc Sensor (DEXCOM G6 SENSOR) MISC, 1 Device by Does not apply route as directed., Disp: 9 each, Rfl: 3   Continuous Blood Gluc Transmit (DEXCOM G6 TRANSMITTER) MISC, 1 Device by Does not apply route as directed., Disp: 1 each, Rfl: 3   empagliflozin (JARDIANCE) 10 MG TABS tablet, Take 1 tablet (10 mg total) by mouth daily., Disp: 30 tablet, Rfl: 11   Ferrous Sulfate (IRON) 325 (65 Fe) MG TABS, Take 1 tablet (325 mg total) by mouth 2 (two) times daily before a meal., Disp: 180 tablet, Rfl: 3   folic acid (FOLVITE) 1 MG tablet, Take 1 tablet (1 mg total) by mouth daily., Disp: 30 tablet, Rfl: 2   gabapentin (NEURONTIN) 300 MG capsule, Take 300 mg by mouth 2 (two) times daily., Disp: , Rfl:    Insulin Glargine (BASAGLAR KWIKPEN) 100 UNIT/ML, Inject 45 Units into the skin at bedtime., Disp: 45 mL, Rfl: 0   insulin lispro (HUMALOG) 100 UNIT/ML injection, Inject 0.05 mLs (5 Units total) into the skin 3 (three) times daily before meals., Disp: 10 mL, Rfl: 0   latanoprost (XALATAN) 0.005 % ophthalmic solution, Place 1 drop into both eyes at bedtime., Disp: , Rfl:    levothyroxine (SYNTHROID, LEVOTHROID) 75 MCG tablet, Take 75 mcg by mouth daily before breakfast., Disp: , Rfl:    losartan (COZAAR) 25 MG tablet, Take 1 tablet (25 mg total) by mouth daily., Disp: 30 tablet, Rfl: 6   LUMIGAN 0.01 % SOLN, Place 1 drop into both eyes at bedtime., Disp: , Rfl:    Multiple Vitamins-Minerals (ALIVE ONCE DAILY WOMENS 50+ PO), Take 1 tablet by mouth daily., Disp: ,  Rfl:    spironolactone (ALDACTONE) 25 MG tablet, Take 0.5 tablets (12.5 mg total) by mouth daily., Disp: 90 tablet, Rfl: 0   torsemide (DEMADEX) 20 MG tablet, Take 1 tablet (20 mg total) by mouth as needed. (Patient taking differently: Take 20 mg by mouth daily as needed (edema).), Disp: 30 tablet, Rfl: 11   Travoprost, BAK Free, (TRAVATAN) 0.004 % SOLN ophthalmic solution, Place 1 drop into both eyes at bedtime., Disp: , Rfl:  Allergies  Allergen Reactions   Codeine Other (See Comments)   Other Other (See Comments)    Blood Product Refusal (Jehovah's witness)     Tape Other (See Comments)    Leaves blisters and marks on the skin   Sulfa Antibiotics Rash   Uloric [Febuxostat] Rash     Social History   Socioeconomic History   Marital status: Widowed    Spouse name: Not on file   Number of children: 3   Years of education: Not on  file   Highest education level: Some college, no degree  Occupational History   Occupation: retired  Tobacco Use   Smoking status: Former    Packs/day: 1.00    Years: 22.00    Total pack years: 22.00    Types: Cigarettes    Quit date: 1983    Years since quitting: 40.8   Smokeless tobacco: Never  Vaping Use   Vaping Use: Never used  Substance and Sexual Activity   Alcohol use: Yes    Comment: 05/22/2018 "glass of wine couple times/wk"   Drug use: Not Currently   Sexual activity: Not Currently    Birth control/protection: None, Post-menopausal  Other Topics Concern   Not on file  Social History Narrative   Not on file   Social Determinants of Health   Financial Resource Strain: Medium Risk (12/22/2021)   Overall Financial Resource Strain (CARDIA)    Difficulty of Paying Living Expenses: Somewhat hard  Food Insecurity: No Food Insecurity (05/08/2018)   Hunger Vital Sign    Worried About Running Out of Food in the Last Year: Never true    Ran Out of Food in the Last Year: Never true  Transportation Needs: No Transportation Needs  (05/08/2018)   PRAPARE - Hydrologist (Medical): No    Lack of Transportation (Non-Medical): No  Physical Activity: Not on file  Stress: Not on file  Social Connections: Not on file  Intimate Partner Violence: Not on file    Physical Exam      Future Appointments  Date Time Provider [place]  05/02/2022  7:00 AM [facility] DEVICE REMOTES [facility]  05/08/2022  9:05 AM MC ECHO/CH OP [facility]ECHOLAB [facility]  05/08/2022 11:00 AM MC-HVSC PA/NP MC-[facility]SC None  05/29/2022 11:20 AM GI-BCG MM 2 GI-BCGMM GI-BREAST CE  08/16/2022 10:30 AM Shamleffer, Melanie Crazier, MD [facility] None     ACTION: Home visit completed

## 2022-04-09 ENCOUNTER — Other Ambulatory Visit (HOSPITAL_COMMUNITY): Payer: Self-pay

## 2022-04-09 NOTE — Progress Notes (Signed)
Paramedicine Encounter    Patient ID: Danielle [name], female    DOB: [birth year], 76 y.o.   MRN: [id number]  Arrived for home visit for [place] who reports to be feeling okay today. She denied shortness of breath, chest pain or dizziness. She states that she had some extra salty foods over the weekend and notices her legs are more swollen. She did miss her Torsemide twice over the weekend as well. I educated her today on low sodium diet and importance of limiting extra fluids and missing medications. She verbalized understanding and knows how this could negatively effect her.   I reviewed meds and filled pill box for two weeks as she plan to go out of town for the upcoming Holiday. I also was able to switch out her Dexcom and educate her on how to switch it out on her own.   I called [facility] and Allupurinol still has not been called in by PCP- Polite. I contacted his office and left a VM with his nurse to send same in to [facility].  Appointments reviewed- she has a lab visit with Dr. Delfina Redwood Wednesday at 37. She is aware and I wrote down a reminder for same.     CBG- 247 (she had breakfast and insulin today prior to my arrival)  Refills: Losartan Levothyroxine Allupurinol   Next home visit for two weeks. She agreed with plans.   Danielle [name], Danielle [name]   Patient Care Team: Seward Carol, MD as PCP - General (Internal Medicine) Constance Haw, MD as PCP - Electrophysiology (Cardiology) Sueanne Margarita, MD as PCP - Sleep Medicine (Cardiology) Bensimhon, Shaune Pascal, MD as PCP - Cardiology (Cardiology) Jorge Ny, LCSW as Social Worker (Licensed Clinical Social Worker) Shamleffer, Melanie Crazier, MD as Consulting Physician (Endocrinology)  Patient Active Problem List   Diagnosis Date Noted   Type 2 diabetes mellitus with stage 3a chronic kidney disease, with long-term current use of insulin ([place]) 02/16/2022   Prosthetic joint implant failure,  initial encounter ([place]) 12/20/2021   Pseudophakia of both eyes 11/15/2021   DVT (deep venous thrombosis) ([place]) 07/31/2021   Acute blood loss anemia    Septic Arthritis, Infection of prosthetic right knee joint ([place]), MSSA infection 24/01/7352   Complication of internal right knee prosthesis ([place]) 07/10/2021   CAD (coronary artery disease) 06/08/2021   Carotid artery disease ([place]) 06/08/2021   Symptomatic bradycardia 06/08/2021   Paroxysmal atrial fibrillation ([place]) 06/08/2021   CKD (chronic kidney disease), stage III ([place]) 06/08/2021   Polyneuropathy associated with underlying disease ([place]) 05/04/2020   Severe nonproliferative diabetic retinopathy of left eye, with macular edema, associated with type 2 diabetes mellitus ([place]) 11/05/2019   Severe nonproliferative diabetic retinopathy of right eye, with macular edema, associated with type 2 diabetes mellitus ([place]) 11/05/2019   Retinal hemorrhage of right eye 11/05/2019   Retinal hemorrhage of left eye 11/05/2019   Diabetes mellitus ([place]) 05/14/2019   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, with long-term current use of insulin ([place]) 05/14/2019   Type 2 diabetes mellitus with diabetic polyneuropathy, with long-term current use of insulin ([place]) 05/14/2019   History of gout 05/20/2018   Primary localized osteoarthritis of right knee 05/13/2018   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) 12/20/2017   Obesity (BMI 30-39.9) 11/11/2017   OSA (obstructive sleep apnea) 02/18/2017   Essential hypertension    L Breast Cancer    Hypothyroidism     Current Outpatient Medications:    acetaminophen (TYLENOL) 500 MG  tablet, Take 500-1,000 mg by mouth every 6 (six) hours as needed (for pain or headaches)., Disp: , Rfl:    allopurinol (ZYLOPRIM) 100 MG tablet, Take 100 mg by mouth daily., Disp: , Rfl:    apixaban (ELIQUIS) 5 MG TABS tablet, Take 1 tablet (5 mg total) by mouth 2 (two) times daily., Disp: 60 tablet, Rfl: 11    atorvastatin (LIPITOR) 80 MG tablet, Take 1 tablet (80 mg total) by mouth at bedtime., Disp: 90 tablet, Rfl: 3   Biotin 10 MG TABS, Take 10 mg by mouth daily., Disp: , Rfl:    Blood Glucose Monitoring Suppl (ONETOUCH VERIO) w/Device KIT, Use to check blood sugar, Disp: 1 kit, Rfl: 0   carvedilol (COREG) 3.125 MG tablet, Take 1 tablet (3.125 mg total) by mouth 2 (two) times daily with a meal., Disp: 60 tablet, Rfl: 6   celecoxib (CELEBREX) 100 MG capsule, Take 100 mg by mouth 2 (two) times daily., Disp: , Rfl:    clotrimazole-betamethasone (LOTRISONE) lotion, Apply 1 application topically 2 (two) times daily as needed (itching)., Disp: , Rfl:    Continuous Blood Gluc Sensor (DEXCOM G6 SENSOR) MISC, 1 Device by Does not apply route as directed., Disp: 9 each, Rfl: 3   Continuous Blood Gluc Transmit (DEXCOM G6 TRANSMITTER) MISC, 1 Device by Does not apply route as directed., Disp: 1 each, Rfl: 3   empagliflozin (JARDIANCE) 10 MG TABS tablet, Take 1 tablet (10 mg total) by mouth daily., Disp: 30 tablet, Rfl: 11   Ferrous Sulfate (IRON) 325 (65 Fe) MG TABS, Take 1 tablet (325 mg total) by mouth 2 (two) times daily before a meal., Disp: 180 tablet, Rfl: 3   folic acid (FOLVITE) 1 MG tablet, Take 1 tablet (1 mg total) by mouth daily., Disp: 30 tablet, Rfl: 2   gabapentin (NEURONTIN) 300 MG capsule, Take 300 mg by mouth 2 (two) times daily., Disp: , Rfl:    Insulin Glargine (BASAGLAR KWIKPEN) 100 UNIT/ML, Inject 45 Units into the skin at bedtime., Disp: 45 mL, Rfl: 0   insulin lispro (HUMALOG) 100 UNIT/ML injection, Inject 0.05 mLs (5 Units total) into the skin 3 (three) times daily before meals., Disp: 10 mL, Rfl: 0   latanoprost (XALATAN) 0.005 % ophthalmic solution, Place 1 drop into both eyes at bedtime., Disp: , Rfl:    levothyroxine (SYNTHROID, LEVOTHROID) 75 MCG tablet, Take 75 mcg by mouth daily before breakfast., Disp: , Rfl:    losartan (COZAAR) 25 MG tablet, Take 1 tablet (25 mg total) by mouth  daily., Disp: 30 tablet, Rfl: 6   LUMIGAN 0.01 % SOLN, Place 1 drop into both eyes at bedtime., Disp: , Rfl:    Multiple Vitamins-Minerals (ALIVE ONCE DAILY WOMENS 50+ PO), Take 1 tablet by mouth daily., Disp: , Rfl:    spironolactone (ALDACTONE) 25 MG tablet, Take 0.5 tablets (12.5 mg total) by mouth daily., Disp: 90 tablet, Rfl: 0   torsemide (DEMADEX) 20 MG tablet, Take 1 tablet (20 mg total) by mouth as needed. (Patient taking differently: Take 20 mg by mouth daily as needed (edema).), Disp: 30 tablet, Rfl: 11   Travoprost, BAK Free, (TRAVATAN) 0.004 % SOLN ophthalmic solution, Place 1 drop into both eyes at bedtime., Disp: , Rfl:  Allergies  Allergen Reactions   Codeine Other (See Comments)   Other Other (See Comments)    Blood Product Refusal (Jehovah's witness)     Tape Other (See Comments)    Leaves blisters and marks on the skin  Sulfa Antibiotics Rash   Uloric [Febuxostat] Rash     Social History   Socioeconomic History   Marital status: Widowed    Spouse name: Not on file   Number of children: 3   Years of education: Not on file   Highest education level: Some college, no degree  Occupational History   Occupation: retired  Tobacco Use   Smoking status: Former    Packs/day: 1.00    Years: 22.00    Total pack years: 22.00    Types: Cigarettes    Quit date: 1983    Years since quitting: 40.8   Smokeless tobacco: Never  Vaping Use   Vaping Use: Never used  Substance and Sexual Activity   Alcohol use: Yes    Comment: 05/22/2018 "glass of wine couple times/wk"   Drug use: Not Currently   Sexual activity: Not Currently    Birth control/protection: None, Post-menopausal  Other Topics Concern   Not on file  Social History Narrative   Not on file   Social Determinants of Health   Financial Resource Strain: Medium Risk (12/22/2021)   Overall Financial Resource Strain (CARDIA)    Difficulty of Paying Living Expenses: Somewhat hard  Food Insecurity: No Food  Insecurity (05/08/2018)   Hunger Vital Sign    Worried About Running Out of Food in the Last Year: Never true    [place] in the Last Year: Never true  Transportation Needs: No Transportation Needs (05/08/2018)   PRAPARE - Hydrologist (Medical): No    Lack of Transportation (Non-Medical): No  Physical Activity: Not on file  Stress: Not on file  Social Connections: Not on file  Intimate Partner Violence: Not on file    Physical Exam      Future Appointments  Date Time Provider [place]  05/02/2022  7:00 AM [facility] DEVICE REMOTES [facility]  05/08/2022  9:05 AM MC ECHO/CH OP [facility]  05/08/2022 11:00 AM MC-HVSC PA/NP MC-HVSC None  05/29/2022 11:20 AM GI-BCG MM 2 GI-BCGMM GI-BREAST CE  08/16/2022 10:30 AM Shamleffer, Melanie Crazier, MD [facility] None     ACTION: Home visit completed

## 2022-04-11 DIAGNOSIS — E039 Hypothyroidism, unspecified: Secondary | ICD-10-CM | POA: Diagnosis not present

## 2022-04-11 DIAGNOSIS — E1165 Type 2 diabetes mellitus with hyperglycemia: Secondary | ICD-10-CM | POA: Diagnosis not present

## 2022-04-24 ENCOUNTER — Other Ambulatory Visit (HOSPITAL_COMMUNITY): Payer: Self-pay

## 2022-04-24 NOTE — Progress Notes (Signed)
Paramedicine Encounter    Patient ID: Danielle [name], female    DOB: 01/19/1946, 76 y.o.   MRN: [id number]  Arrived for home visit for [facility] who reports to be feeling well today denying chest pain, shortness of breath, dizziness, swelling or increased weight gain. I reviewed medications and no missed doses noted. I filled pill box for one week.   I obtained vitals as noted: WT- 198lbs  BP- 120/62 HR-76 O2- 94% CBG- 248  Lungs- clear Swelling- minimal   I replaced dexcom sensor today and re-educated about insulin dosings. We also discussed diet adherence as well.   We reviewed upcoming appointments and confirmed same.   Home visit complete [name].   Refills called into [facility]-  Levothyroxine Folic Acid Torsemide Jardiance Celebrex  Carvedilol   [name], [place] 04/24/2022      Patient Care Team: Seward Carol, MD as PCP - General (Internal Medicine) Constance Haw, MD as PCP - Electrophysiology (Cardiology) Sueanne Margarita, MD as PCP - Sleep Medicine (Cardiology) Bensimhon, Shaune Pascal, MD as PCP - Cardiology (Cardiology) Jorge Ny, LCSW as Social Worker (Licensed Clinical Social Worker) Shamleffer, Melanie Crazier, MD as Consulting Physician (Endocrinology)  Patient Active Problem List   Diagnosis Date Noted   Type 2 diabetes mellitus with stage 3a chronic kidney disease, with long-term current use of insulin ([place]) 02/16/2022   Prosthetic joint implant failure, initial encounter ([place]) 12/20/2021   Pseudophakia of both eyes 11/15/2021   DVT (deep venous thrombosis) ([place]) 07/31/2021   Acute blood loss anemia    Septic Arthritis, Infection of prosthetic right knee joint ([place]), MSSA infection 58/30/9407   Complication of internal right knee prosthesis ([place]) 07/10/2021   CAD (coronary artery disease) 06/08/2021   Carotid artery disease ([place]) 06/08/2021   Symptomatic bradycardia 06/08/2021   Paroxysmal  atrial fibrillation ([place]) 06/08/2021   CKD (chronic kidney disease), stage III ([place]) 06/08/2021   Polyneuropathy associated with underlying disease ([place]) 05/04/2020   Severe nonproliferative diabetic retinopathy of left eye, with macular edema, associated with type 2 diabetes mellitus ([place]) 11/05/2019   Severe nonproliferative diabetic retinopathy of right eye, with macular edema, associated with type 2 diabetes mellitus ([place]) 11/05/2019   Retinal hemorrhage of right eye 11/05/2019   Retinal hemorrhage of left eye 11/05/2019   Diabetes mellitus ([place]) 05/14/2019   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, with long-term current use of insulin ([place]) 05/14/2019   Type 2 diabetes mellitus with diabetic polyneuropathy, with long-term current use of insulin ([place]) 05/14/2019   History of gout 05/20/2018   Primary localized osteoarthritis of right knee 05/13/2018   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) 12/20/2017   Obesity (BMI 30-39.9) 11/11/2017   OSA (obstructive sleep apnea) 02/18/2017   Essential hypertension    L Breast Cancer    Hypothyroidism     Current Outpatient Medications:    acetaminophen (TYLENOL) 500 MG tablet, Take 500-1,000 mg by mouth every 6 (six) hours as needed (for pain or headaches)., Disp: , Rfl:    allopurinol (ZYLOPRIM) 100 MG tablet, Take 100 mg by mouth daily., Disp: , Rfl:    apixaban (ELIQUIS) 5 MG TABS tablet, Take 1 tablet (5 mg total) by mouth 2 (two) times daily., Disp: 60 tablet, Rfl: 11   atorvastatin (LIPITOR) 80 MG tablet, Take 1 tablet (80 mg total) by mouth at bedtime., Disp: 90 tablet, Rfl: 3   Biotin 10 MG TABS, Take 10 mg by mouth daily.,  Disp: , Rfl:    Blood Glucose Monitoring Suppl (ONETOUCH VERIO) w/Device KIT, Use to check blood sugar, Disp: 1 kit, Rfl: 0   carvedilol (COREG) 3.125 MG tablet, Take 1 tablet (3.125 mg total) by mouth 2 (two) times daily with a meal., Disp: 60 tablet, Rfl: 6   celecoxib (CELEBREX) 100 MG  capsule, Take 100 mg by mouth 2 (two) times daily., Disp: , Rfl:    clotrimazole-betamethasone (LOTRISONE) lotion, Apply 1 application topically 2 (two) times daily as needed (itching)., Disp: , Rfl:    Continuous Blood Gluc Sensor (DEXCOM G6 SENSOR) MISC, 1 Device by Does not apply route as directed., Disp: 9 each, Rfl: 3   Continuous Blood Gluc Transmit (DEXCOM G6 TRANSMITTER) MISC, 1 Device by Does not apply route as directed., Disp: 1 each, Rfl: 3   empagliflozin (JARDIANCE) 10 MG TABS tablet, Take 1 tablet (10 mg total) by mouth daily., Disp: 30 tablet, Rfl: 11   Ferrous Sulfate (IRON) 325 (65 Fe) MG TABS, Take 1 tablet (325 mg total) by mouth 2 (two) times daily before a meal., Disp: 180 tablet, Rfl: 3   folic acid (FOLVITE) 1 MG tablet, Take 1 tablet (1 mg total) by mouth daily., Disp: 30 tablet, Rfl: 2   gabapentin (NEURONTIN) 300 MG capsule, Take 300 mg by mouth 2 (two) times daily., Disp: , Rfl:    Insulin Glargine (BASAGLAR KWIKPEN) 100 UNIT/ML, Inject 45 Units into the skin at bedtime., Disp: 45 mL, Rfl: 0   insulin lispro (HUMALOG) 100 UNIT/ML injection, Inject 0.05 mLs (5 Units total) into the skin 3 (three) times daily before meals., Disp: 10 mL, Rfl: 0   latanoprost (XALATAN) 0.005 % ophthalmic solution, Place 1 drop into both eyes at bedtime., Disp: , Rfl:    levothyroxine (SYNTHROID, LEVOTHROID) 75 MCG tablet, Take 75 mcg by mouth daily before breakfast., Disp: , Rfl:    losartan (COZAAR) 25 MG tablet, Take 1 tablet (25 mg total) by mouth daily., Disp: 30 tablet, Rfl: 6   LUMIGAN 0.01 % SOLN, Place 1 drop into both eyes at bedtime., Disp: , Rfl:    Multiple Vitamins-Minerals (ALIVE ONCE DAILY WOMENS 50+ PO), Take 1 tablet by mouth daily., Disp: , Rfl:    spironolactone (ALDACTONE) 25 MG tablet, Take 0.5 tablets (12.5 mg total) by mouth daily., Disp: 90 tablet, Rfl: 0   torsemide (DEMADEX) 20 MG tablet, Take 1 tablet (20 mg total) by mouth as needed. (Patient taking differently: Take  20 mg by mouth daily as needed (edema).), Disp: 30 tablet, Rfl: 11   Travoprost, BAK Free, (TRAVATAN) 0.004 % SOLN ophthalmic solution, Place 1 drop into both eyes at bedtime., Disp: , Rfl:  Allergies  Allergen Reactions   Codeine Other (See Comments)   Other Other (See Comments)    Blood Product Refusal (Jehovah's witness)     Tape Other (See Comments)    Leaves blisters and marks on the skin   Sulfa Antibiotics Rash   Uloric [Febuxostat] Rash     Social History   Socioeconomic History   Marital status: Widowed    Spouse name: Not on file   Number of children: 3   Years of education: Not on file   Highest education level: Some college, no degree  Occupational History   Occupation: retired  Tobacco Use   Smoking status: Former    Packs/day: 1.00    Years: 22.00    Total pack years: 22.00    Types: Cigarettes    Quit  date: 37    Years since quitting: 40.9   Smokeless tobacco: Never  Vaping Use   Vaping Use: Never used  Substance and Sexual Activity   Alcohol use: Yes    Comment: 05/22/2018 "glass of wine couple times/wk"   Drug use: Not Currently   Sexual activity: Not Currently    Birth control/protection: None, Post-menopausal  Other Topics Concern   Not on file  Social History Narrative   Not on file   Social Determinants of Health   Financial Resource Strain: Medium Risk (12/22/2021)   Overall Financial Resource Strain (CARDIA)    Difficulty of Paying Living Expenses: Somewhat hard  Food Insecurity: No Food Insecurity (05/08/2018)   Hunger Vital Sign    Worried About Running Out of Food in the Last Year: Never true    Ran Out of Food in the Last Year: Never true  Transportation Needs: No Transportation Needs (05/08/2018)   PRAPARE - Hydrologist (Medical): No    Lack of Transportation (Non-Medical): No  Physical Activity: Not on file  Stress: Not on file  Social Connections: Not on file  Intimate Partner Violence: Not on  file    Physical Exam      Future Appointments  Date Time Provider [place]  05/02/2022  7:00 AM [facility] DEVICE REMOTES [facility]  05/08/2022  9:05 AM MC ECHO/CH OP [facility]OL[facility]  05/08/2022 11:00 AM MC-HVSC PA/NP MC-HVSC None  05/29/2022 11:20 AM GI-BCG MM 2 GI-BCGMM GI-BREAST CE  08/16/2022 10:30 AM Shamleffer, Melanie Crazier, MD LB[facility]-LBENDO None     ACTION: Home visit completed

## 2022-04-25 DIAGNOSIS — Z961 Presence of intraocular lens: Secondary | ICD-10-CM | POA: Diagnosis not present

## 2022-04-25 DIAGNOSIS — Z794 Long term (current) use of insulin: Secondary | ICD-10-CM | POA: Diagnosis not present

## 2022-04-25 DIAGNOSIS — E113212 Type 2 diabetes mellitus with mild nonproliferative diabetic retinopathy with macular edema, left eye: Secondary | ICD-10-CM | POA: Diagnosis not present

## 2022-04-25 DIAGNOSIS — E113291 Type 2 diabetes mellitus with mild nonproliferative diabetic retinopathy without macular edema, right eye: Secondary | ICD-10-CM | POA: Diagnosis not present

## 2022-04-25 DIAGNOSIS — H401133 Primary open-angle glaucoma, bilateral, severe stage: Secondary | ICD-10-CM | POA: Diagnosis not present

## 2022-04-27 ENCOUNTER — Ambulatory Visit: Payer: Medicare HMO | Admitting: Internal Medicine

## 2022-05-01 ENCOUNTER — Other Ambulatory Visit (HOSPITAL_COMMUNITY): Payer: Self-pay

## 2022-05-01 NOTE — Progress Notes (Signed)
Paramedicine Encounter    Patient ID: Danielle [name], female    DOB: [birth year], 76 y.o.   MRN: [id number]  Arrived for home visit for [facility] who reports she has been feeling overly fatigued and has gained some weight over the last week and she reports she has not been eating more than normal. She is up 8lbs this week from our visit last week. She has had no missed doses over the last week. She denied increased shortness of breath but her lower legs are more swollen than normal. I asked if she remembered if she had more salt or fluid intake over the last week and she stated it was possible.   Our visit was after clinic hours so I instructed her to take an extra torsemide tomorrow morning and Thursday morning as standing orders for her is to take an extra if needed. I will call her over the phone on Thursday to see how she is feeling and if her weight has gone down. She sees APP at HF clinic in one week following her ECHO.   We discussed HF management and fluid and diet restrictions. She verbalized understanding.   I reviewed meds and filled pill box accordingly. (Including extra torsemide for tomorrow and weds)  Refills called into [facility]: Arlyce Harman (needs new RX)  She got her eliquis renewal packet. I will assist with same.   She received her new Dexcom G7, I educated her on same and placed same on left arm. She felt comfortable using same.    Home visit complete. I will follow up. She agreed and knows to reach out if needed.   [name], [place] 05/01/2022  Patient Care Team: Seward Carol, MD as PCP - General (Internal Medicine) Constance Haw, MD as PCP - Electrophysiology (Cardiology) Sueanne Margarita, MD as PCP - Sleep Medicine (Cardiology) Bensimhon, Shaune Pascal, MD as PCP - Cardiology (Cardiology) Jorge Ny, LCSW as Social Worker (Licensed Clinical Social Worker) Shamleffer, Melanie Crazier, MD as Consulting Physician (Endocrinology)  Patient  Active Problem List   Diagnosis Date Noted   Type 2 diabetes mellitus with stage 3a chronic kidney disease, with long-term current use of insulin ([place]) 02/16/2022   Prosthetic joint implant failure, initial encounter ([place]) 12/20/2021   Pseudophakia of both eyes 11/15/2021   DVT (deep venous thrombosis) ([place]) 07/31/2021   Acute blood loss anemia    Septic Arthritis, Infection of prosthetic right knee joint ([place]), MSSA infection 03/88/8280   Complication of internal right knee prosthesis ([place]) 07/10/2021   CAD (coronary artery disease) 06/08/2021   Carotid artery disease ([place]) 06/08/2021   Symptomatic bradycardia 06/08/2021   Paroxysmal atrial fibrillation ([place]) 06/08/2021   CKD (chronic kidney disease), stage III ([place]) 06/08/2021   Polyneuropathy associated with underlying disease ([place]) 05/04/2020   Severe nonproliferative diabetic retinopathy of left eye, with macular edema, associated with type 2 diabetes mellitus ([place]) 11/05/2019   Severe nonproliferative diabetic retinopathy of right eye, with macular edema, associated with type 2 diabetes mellitus ([place]) 11/05/2019   Retinal hemorrhage of right eye 11/05/2019   Retinal hemorrhage of left eye 11/05/2019   Diabetes mellitus ([place]) 05/14/2019   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, with long-term current use of insulin ([place]) 05/14/2019   Type 2 diabetes mellitus with diabetic polyneuropathy, with long-term current use of insulin ([place]) 05/14/2019   History of gout 05/20/2018   Primary localized osteoarthritis of right knee 05/13/2018   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) 12/20/2017  Obesity (BMI 30-39.9) 11/11/2017   OSA (obstructive sleep apnea) 02/18/2017   Essential hypertension    L Breast Cancer    Hypothyroidism     Current Outpatient Medications:    acetaminophen (TYLENOL) 500 MG tablet, Take 500-1,000 mg by mouth every 6 (six) hours as needed (for pain or headaches)., Disp: , Rfl:    allopurinol  (ZYLOPRIM) 100 MG tablet, Take 100 mg by mouth daily., Disp: , Rfl:    apixaban (ELIQUIS) 5 MG TABS tablet, Take 1 tablet (5 mg total) by mouth 2 (two) times daily., Disp: 60 tablet, Rfl: 11   atorvastatin (LIPITOR) 80 MG tablet, Take 1 tablet (80 mg total) by mouth at bedtime., Disp: 90 tablet, Rfl: 3   Biotin 10 MG TABS, Take 10 mg by mouth daily., Disp: , Rfl:    Blood Glucose Monitoring Suppl (ONETOUCH VERIO) w/Device KIT, Use to check blood sugar, Disp: 1 kit, Rfl: 0   carvedilol (COREG) 3.125 MG tablet, Take 1 tablet (3.125 mg total) by mouth 2 (two) times daily with a meal., Disp: 60 tablet, Rfl: 6   celecoxib (CELEBREX) 100 MG capsule, Take 100 mg by mouth 2 (two) times daily., Disp: , Rfl:    clotrimazole-betamethasone (LOTRISONE) lotion, Apply 1 application topically 2 (two) times daily as needed (itching)., Disp: , Rfl:    Continuous Blood Gluc Sensor (DEXCOM G6 SENSOR) MISC, 1 Device by Does not apply route as directed., Disp: 9 each, Rfl: 3   Continuous Blood Gluc Transmit (DEXCOM G6 TRANSMITTER) MISC, 1 Device by Does not apply route as directed., Disp: 1 each, Rfl: 3   empagliflozin (JARDIANCE) 10 MG TABS tablet, Take 1 tablet (10 mg total) by mouth daily., Disp: 30 tablet, Rfl: 11   Ferrous Sulfate (IRON) 325 (65 Fe) MG TABS, Take 1 tablet (325 mg total) by mouth 2 (two) times daily before a meal., Disp: 180 tablet, Rfl: 3   folic acid (FOLVITE) 1 MG tablet, Take 1 tablet (1 mg total) by mouth daily., Disp: 30 tablet, Rfl: 2   gabapentin (NEURONTIN) 300 MG capsule, Take 300 mg by mouth 2 (two) times daily., Disp: , Rfl:    Insulin Glargine (BASAGLAR KWIKPEN) 100 UNIT/ML, Inject 45 Units into the skin at bedtime., Disp: 45 mL, Rfl: 0   insulin lispro (HUMALOG) 100 UNIT/ML injection, Inject 0.05 mLs (5 Units total) into the skin 3 (three) times daily before meals., Disp: 10 mL, Rfl: 0   latanoprost (XALATAN) 0.005 % ophthalmic solution, Place 1 drop into both eyes at bedtime., Disp: ,  Rfl:    levothyroxine (SYNTHROID, LEVOTHROID) 75 MCG tablet, Take 75 mcg by mouth daily before breakfast., Disp: , Rfl:    losartan (COZAAR) 25 MG tablet, Take 1 tablet (25 mg total) by mouth daily., Disp: 30 tablet, Rfl: 6   LUMIGAN 0.01 % SOLN, Place 1 drop into both eyes at bedtime., Disp: , Rfl:    Multiple Vitamins-Minerals (ALIVE ONCE DAILY WOMENS 50+ PO), Take 1 tablet by mouth daily., Disp: , Rfl:    spironolactone (ALDACTONE) 25 MG tablet, Take 0.5 tablets (12.5 mg total) by mouth daily., Disp: 90 tablet, Rfl: 0   torsemide (DEMADEX) 20 MG tablet, Take 1 tablet (20 mg total) by mouth as needed. (Patient taking differently: Take 20 mg by mouth daily as needed (edema).), Disp: 30 tablet, Rfl: 11   Travoprost, BAK Free, (TRAVATAN) 0.004 % SOLN ophthalmic solution, Place 1 drop into both eyes at bedtime., Disp: , Rfl:  Allergies  Allergen Reactions  Codeine Other (See Comments)   Other Other (See Comments)    Blood Product Refusal (Jehovah's witness)     Tape Other (See Comments)    Leaves blisters and marks on the skin   Sulfa Antibiotics Rash   Uloric [Febuxostat] Rash     Social History   Socioeconomic History   Marital status: Widowed    Spouse name: Not on file   Number of children: 3   Years of education: Not on file   Highest education level: Some college, no degree  Occupational History   Occupation: retired  Tobacco Use   Smoking status: Former    Packs/day: 1.00    Years: 22.00    Total pack years: 22.00    Types: Cigarettes    Quit date: 1983    Years since quitting: 40.9   Smokeless tobacco: Never  Vaping Use   Vaping Use: Never used  Substance and Sexual Activity   Alcohol use: Yes    Comment: 05/22/2018 "glass of wine couple times/wk"   Drug use: Not Currently   Sexual activity: Not Currently    Birth control/protection: None, Post-menopausal  Other Topics Concern   Not on file  Social History Narrative   Not on file   Social Determinants of  Health   Financial Resource Strain: Medium Risk (12/22/2021)   Overall Financial Resource Strain (CARDIA)    Difficulty of Paying Living Expenses: Somewhat hard  Food Insecurity: No Food Insecurity (05/08/2018)   Hunger Vital Sign    Worried About Running Out of Food in the Last Year: Never true    [place] in the Last Year: Never true  Transportation Needs: No Transportation Needs (05/08/2018)   PRAPARE - Hydrologist (Medical): No    Lack of Transportation (Non-Medical): No  Physical Activity: Not on file  Stress: Not on file  Social Connections: Not on file  Intimate Partner Violence: Not on file    Physical Exam      Future Appointments  Date Time Provider [place]  05/02/2022  7:00 AM [facility] DEVICE REMOTES [facility]  05/08/2022  9:05 AM MC ECHO/CH OP [facility]  05/08/2022 11:00 AM MC-HVSC PA/NP MC-HVSC None  05/29/2022 11:20 AM GI-BCG MM 2 GI-BCGMM GI-BREAST CE  08/16/2022 10:30 AM Shamleffer, Melanie Crazier, MD [facility]-LBENDO None     ACTION: Home visit completed

## 2022-05-02 ENCOUNTER — Other Ambulatory Visit (HOSPITAL_COMMUNITY): Payer: Self-pay | Admitting: *Deleted

## 2022-05-02 ENCOUNTER — Ambulatory Visit (INDEPENDENT_AMBULATORY_CARE_PROVIDER_SITE_OTHER): Payer: Medicare HMO

## 2022-05-02 DIAGNOSIS — I495 Sick sinus syndrome: Secondary | ICD-10-CM | POA: Diagnosis not present

## 2022-05-02 MED ORDER — SPIRONOLACTONE 25 MG PO TABS: 12.5000 mg | ORAL_TABLET | Freq: Every day | ORAL | 3 refills | Status: DC

## 2022-05-03 ENCOUNTER — Telehealth (HOSPITAL_COMMUNITY): Payer: Self-pay

## 2022-05-03 NOTE — Telephone Encounter (Signed)
Called to see why Danielle [name] missed her remote transmission- she said she had unplugged her remote box- I facetimed her and I had her plug it back in and I successfully helped her send that in. She agreed to leave it plugged in. Call complete.   [name], [place] 05/03/2022

## 2022-05-04 LAB — CUP PACEART REMOTE DEVICE CHECK
Battery Remaining Longevity: 39 mo
Battery Voltage: 2.96 V
Brady Statistic AP VP Percent: 0.01 %
Brady Statistic AP VS Percent: 5.29 %
Brady Statistic AS VP Percent: 0.06 %
Brady Statistic AS VS Percent: 94.65 %
Brady Statistic RA Percent Paced: 5.29 %
Brady Statistic RV Percent Paced: 0.07 %
Date Time Interrogation Session: 20231207181934
Implantable Lead Connection Status: 753985
Implantable Lead Connection Status: 753985
Implantable Lead Implant Date: 20160222
Implantable Lead Implant Date: 20160222
Implantable Lead Location: 753859
Implantable Lead Location: 753860
Implantable Lead Model: 5076
Implantable Lead Model: 5076
Implantable Pulse Generator Implant Date: 20160222
Lead Channel Impedance Value: 361 Ohm
Lead Channel Impedance Value: 380 Ohm
Lead Channel Impedance Value: 418 Ohm
Lead Channel Impedance Value: 456 Ohm
Lead Channel Pacing Threshold Amplitude: 0.75 V
Lead Channel Pacing Threshold Amplitude: 0.75 V
Lead Channel Pacing Threshold Pulse Width: 0.4 ms
Lead Channel Pacing Threshold Pulse Width: 0.4 ms
Lead Channel Sensing Intrinsic Amplitude: 19.625 mV
Lead Channel Sensing Intrinsic Amplitude: 19.625 mV
Lead Channel Sensing Intrinsic Amplitude: 2.375 mV
Lead Channel Sensing Intrinsic Amplitude: 2.375 mV
Lead Channel Setting Pacing Amplitude: 2 V
Lead Channel Setting Pacing Amplitude: 2 V
Lead Channel Setting Pacing Pulse Width: 0.4 ms
Lead Channel Setting Sensing Sensitivity: 2 mV
Zone Setting Status: 755011
Zone Setting Status: 755011

## 2022-05-07 NOTE — Progress Notes (Signed)
Advanced Heart Failure Clinic Note   PCP: Seward Carol, MD EP: Dr. Curt Bears Rheumatology: Dr. Posey Rea HF Cardiologist: Dr. Haroldine Laws  HPI: Danielle [name] is a 76 y.o.female with a past medical history of systolic HF (EF 97-35%), CAD s/p multiple stents in [place] (with h/o LAD stent thrombosis), ischemic cardiomyopathy (EF 10% at one point), symptomatic bradycardia s/p PPM, HTN, DM, hypothyroidism, and OSA.   Admitted 9/18 with SOB, acute on chronic systolic CHF. Diuresed with IV lasix (12 pounds), discharge weight was 215 pounds.    Had R TKR 12/19.    Admitted 11/22 with right knee septic arthritis. Arthrocentesis culture grew staph aureus.  Underwent I&D and polyexchange of infected right total knee arthroplasty.  Treated with 6 weeks IV antibiotics followed by suppressive abx. Aldactone held, losartan reduced to 25 mg daily and coreg reduced to 12.5 mg BID d/t soft BP. She was given 1 dose of furosemide on admission due to leg edema but this was held later due to AKI.  Torsemide was resumed at discharge.  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  Patient underwent explant right total knee arthroplasty with implantation of articulating antibiotic spacer by Dr. Zachery Dakins on 07/12/2021. Placed on IV abx. Was discharged to SNF. During hospitalization was very anemic, but improving.   Echo 2/23 EF 40-45%   Follow up 4/23, unsure what meds she was taking. Re-enrolled in paramedicine.   Had knee re-replaced on 12/20/21  Today she returns for HF follow up with paramedic, [name]. Overall feeling fine. Over past week fluid was accumulating and restarted torsemide 20 daily. Weight down 205 -> 201    Cardiac Studies: Echo (9/18): EF 45-50% Grade 2DD Left Atrium Severely dilated.  Echo (8/19): EF 45-50%, G2DD, LA mildly dilated, trileaflet moderately thick aortic valve Echo (12/21): EF 45-50% G2DD  Echo (2/23): EF 40-45%   ROS: All systems negative  except as listed in HPI, PMH and Problem List.  SH:  Social History   Socioeconomic History   Marital status: Widowed    Spouse name: Not on file   Number of children: 3   Years of education: Not on file   Highest education level: Some college, no degree  Occupational History   Occupation: retired  Tobacco Use   Smoking status: Former    Packs/day: 1.00    Years: 22.00    Total pack years: 22.00    Types: Cigarettes    Quit date: 1983    Years since quitting: 40.9   Smokeless tobacco: Never  Vaping Use   Vaping Use: Never used  Substance and Sexual Activity   Alcohol use: Yes    Comment: 05/22/2018 "glass of wine couple times/wk"   Drug use: Not Currently   Sexual activity: Not Currently    Birth control/protection: None, Post-menopausal  Other Topics Concern   Not on file  Social History Narrative   Not on file   Social Determinants of Health   Financial Resource Strain: Medium Risk (12/22/2021)   Overall Financial Resource Strain (CARDIA)    Difficulty of Paying Living Expenses: Somewhat hard  Food Insecurity: No Food Insecurity (05/08/2018)   Hunger Vital Sign    Worried About Running Out of Food in the Last Year: Never true    [place] in the Last Year: Never true  Transportation Needs: No Transportation Needs (05/08/2018)   PRAPARE - Hydrologist (Medical): No    Lack of Transportation (Non-Medical):  No  Physical Activity: Not on file  Stress: Not on file  Social Connections: Not on file  Intimate Partner Violence: Not on file    FH:  Family History  Problem Relation Age of Onset   Multiple myeloma Mother    Heart disease Father    Other Sister    Heart disease Brother    Diabetes Sister    Other Sister     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[place]) 02/05/2017   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 05/20/2018   Acute kidney  failure ([place]) 05/15/2018   Arthritis    "all over" (05/22/2018)   Blood dyscrasia    per pt-has small blood cells-appears as if anemic   Breast cancer, left breast ([place]) 1985   CHF (congestive heart failure) (HCC)    Complication of anesthesia    difficult to awaken from per pt   Coronary artery disease    Dyspnea    Essential hypertension    Heart disease    Heart murmur    History of gout    Hypothyroidism    Obesity (BMI 30-39.9) 11/11/2017   OSA (obstructive sleep apnea) 02/18/2017    severe obstructive sleep apnea with an AHI of 75.6/h and mild central sleep apnea with a CAI of 7.7/h.  Oxygen saturations dropped as low as 82%.   He is on CPAP at 9 cm H2O.   OSA on CPAP    Personal history of chemotherapy    Personal history of radiation therapy    Presence of permanent cardiac pacemaker    Primary localized osteoarthritis of right knee 05/13/2018   Refusal of blood transfusions as patient is Jehovah's Witness    Type II diabetes mellitus ([place])    Current Outpatient Medications  Medication Sig Dispense Refill   acetaminophen (TYLENOL) 500 MG tablet Take 500-1,000 mg by mouth every 6 (six) hours as needed (for pain or headaches).     allopurinol (ZYLOPRIM) 100 MG tablet Take 100 mg by mouth daily.     apixaban (ELIQUIS) 5 MG TABS tablet Take 1 tablet (5 mg total) by mouth 2 (two) times daily. 60 tablet 11   atorvastatin (LIPITOR) 80 MG tablet Take 1 tablet (80 mg total) by mouth at bedtime. 90 tablet 3   Biotin 10 MG TABS Take 10 mg by mouth daily.     Blood Glucose Monitoring Suppl (ONETOUCH VERIO) w/Device KIT Use to check blood sugar 1 kit 0   carvedilol (COREG) 3.125 MG tablet Take 1 tablet (3.125 mg total) by mouth 2 (two) times daily with a meal. 60 tablet 6   celecoxib (CELEBREX) 100 MG capsule Take 100 mg by mouth 2 (two) times daily.     clotrimazole-betamethasone (LOTRISONE) lotion Apply 1 application topically 2 (two) times daily as needed (itching).     Continuous  Blood Gluc Sensor (DEXCOM G6 SENSOR) MISC 1 Device by Does not apply route as directed. 9 each 3   Continuous Blood Gluc Transmit (DEXCOM G6 TRANSMITTER) MISC 1 Device by Does not apply route as directed. 1 each 3   empagliflozin (JARDIANCE) 10 MG TABS tablet Take 1 tablet (10 mg total) by mouth daily. 30 tablet 11   Ferrous Sulfate (IRON) 325 (65 Fe) MG TABS Take 1 tablet (325 mg total) by mouth 2 (two) times daily before a meal. 937 tablet 3   folic acid (FOLVITE) 1 MG tablet Take 1  tablet (1 mg total) by mouth daily. 30 tablet 2   gabapentin (NEURONTIN) 300 MG capsule Take 300 mg by mouth 2 (two) times daily.     Insulin Glargine (BASAGLAR KWIKPEN) 100 UNIT/ML Inject 45 Units into the skin at bedtime. 45 mL 0   insulin lispro (HUMALOG) 100 UNIT/ML injection Inject 0.05 mLs (5 Units total) into the skin 3 (three) times daily before meals. 10 mL 0   latanoprost (XALATAN) 0.005 % ophthalmic solution Place 1 drop into both eyes at bedtime.     levothyroxine (SYNTHROID, LEVOTHROID) 75 MCG tablet Take 75 mcg by mouth daily before breakfast.     losartan (COZAAR) 25 MG tablet Take 1 tablet (25 mg total) by mouth daily. 30 tablet 6   LUMIGAN 0.01 % SOLN Place 1 drop into both eyes at bedtime.     Multiple Vitamins-Minerals (ALIVE ONCE DAILY WOMENS 50+ PO) Take 1 tablet by mouth daily.     spironolactone (ALDACTONE) 25 MG tablet Take 0.5 tablets (12.5 mg total) by mouth daily. 45 tablet 3   torsemide (DEMADEX) 20 MG tablet Take 1 tablet (20 mg total) by mouth as needed. (Patient taking differently: Take 20 mg by mouth daily as needed (edema).) 30 tablet 11   Travoprost, BAK Free, (TRAVATAN) 0.004 % SOLN ophthalmic solution Place 1 drop into both eyes at bedtime.     No current facility-administered medications for this visit.   There were no vitals taken for this visit.  Wt Readings from Last 3 Encounters:  05/01/22 93.4 kg (206 lb)  04/24/22 89.8 kg (198 lb)  04/09/22 90.3 kg (199 lb)   PHYSICAL  EXAM: General:  NAD. No resp difficulty, walked into clinic with RW. Sitting on exam table drinking botle of water HEENT: normal Neck: supple. no JVD. Carotids 2+ bilat; no bruits. No lymphadenopathy or thryomegaly appreciated. Cor: PMI nondisplaced. Regular rate & rhythm. No rubs, gallops or murmurs. Lungs: clear Abdomen: soft, nontender, nondistended. No hepatosplenomegaly. No bruits or masses. Good bowel sounds. Extremities: no cyanosis, clubbing, rash,  wrapped 1+ edema 0n R (TKR leg) trace edema on left Neuro: alert & orientedx3, cranial nerves grossly intact. moves all 4 extremities w/o difficulty. Affect pleasant   Device interrogation (personally reviewed): < 1% v-pacing, no AF, 0.8 hr/day activity. Personally reviewed   ASSESSMENT & PLAN:  1. Chronic systolic CHF: ICM - Echo (12/21): EF 45-50% G2DD  - Echo (2/23): EF 40-45%  - Stable NYHA II. Volume looks good with recent restart of torsemide 20 daily - Continue torsemide 20 daily - Continue spiro 12.5 mg daily. - Continue losartan 25 mg daily.  - Continue carvedilol 3.125 mg bid. - Continue Jardiance 10 mg daily.  2. CAD:  - Stenting in 2010, 2012 and 2016.  - No s/s ischemia - Continue statin.  - No ASA with Eliquis.   3. Paroxysmal Afib - In NSR today - Continue Eliquis 5 mg bid. No bleeding. - Hgb 10.6 on labs 10/13/21. (She is Jehovah's Witness)   4. HTN - BP looks good - Continue current medications.    5. DM - Per Endocrine. On insulin. - Continue Jardiance.    6. H/o sinus pauses s/p PPM - Dr. Curt Bears now following.  - No change.    7. OSA - Continue CPAP nightly.  - Follows with Dr. Radford Pax.   8. CKD 3b - Baseline SCr ~ 1.4. - Continue Jardiance. - labs today   9. 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

## 2022-05-08 ENCOUNTER — Encounter (HOSPITAL_COMMUNITY): Payer: Self-pay

## 2022-05-08 ENCOUNTER — Ambulatory Visit (HOSPITAL_BASED_OUTPATIENT_CLINIC_OR_DEPARTMENT_OTHER)
Admission: RE | Admit: 2022-05-08 | Discharge: 2022-05-08 | Disposition: A | Discharge status: Home / Self Care | Payer: Medicare HMO | Source: Ambulatory Visit | Attending: Family Medicine | Admitting: Family Medicine

## 2022-05-08 ENCOUNTER — Ambulatory Visit (HOSPITAL_COMMUNITY)
Admission: RE | Admit: 2022-05-08 | Discharge: 2022-05-08 | Disposition: A | Discharge status: Home / Self Care | Payer: Medicare HMO | Source: Ambulatory Visit | Attending: Internal Medicine | Admitting: Internal Medicine

## 2022-05-08 ENCOUNTER — Other Ambulatory Visit (HOSPITAL_COMMUNITY): Payer: Self-pay

## 2022-05-08 VITALS — BP 122/80 | HR 68 | Wt 207.0 lb

## 2022-05-08 DIAGNOSIS — I5032 Chronic diastolic (congestive) heart failure: Secondary | ICD-10-CM | POA: Diagnosis not present

## 2022-05-08 DIAGNOSIS — E119 Type 2 diabetes mellitus without complications: Secondary | ICD-10-CM

## 2022-05-08 DIAGNOSIS — I5022 Chronic systolic (congestive) heart failure: Secondary | ICD-10-CM | POA: Insufficient documentation

## 2022-05-08 DIAGNOSIS — I495 Sick sinus syndrome: Secondary | ICD-10-CM | POA: Diagnosis not present

## 2022-05-08 DIAGNOSIS — Z950 Presence of cardiac pacemaker: Secondary | ICD-10-CM | POA: Diagnosis not present

## 2022-05-08 DIAGNOSIS — N1832 Chronic kidney disease, stage 3b: Secondary | ICD-10-CM | POA: Insufficient documentation

## 2022-05-08 DIAGNOSIS — I2510 Atherosclerotic heart disease of native coronary artery without angina pectoris: Secondary | ICD-10-CM | POA: Diagnosis not present

## 2022-05-08 DIAGNOSIS — E1122 Type 2 diabetes mellitus with diabetic chronic kidney disease: Secondary | ICD-10-CM | POA: Diagnosis not present

## 2022-05-08 DIAGNOSIS — I480 Paroxysmal atrial fibrillation: Secondary | ICD-10-CM | POA: Insufficient documentation

## 2022-05-08 DIAGNOSIS — Z7984 Long term (current) use of oral hypoglycemic drugs: Secondary | ICD-10-CM | POA: Diagnosis not present

## 2022-05-08 DIAGNOSIS — E039 Hypothyroidism, unspecified: Secondary | ICD-10-CM | POA: Insufficient documentation

## 2022-05-08 DIAGNOSIS — M7989 Other specified soft tissue disorders: Secondary | ICD-10-CM | POA: Diagnosis not present

## 2022-05-08 DIAGNOSIS — R5383 Other fatigue: Secondary | ICD-10-CM

## 2022-05-08 DIAGNOSIS — Z955 Presence of coronary angioplasty implant and graft: Secondary | ICD-10-CM | POA: Diagnosis not present

## 2022-05-08 DIAGNOSIS — I255 Ischemic cardiomyopathy: Secondary | ICD-10-CM | POA: Diagnosis not present

## 2022-05-08 DIAGNOSIS — Z794 Long term (current) use of insulin: Secondary | ICD-10-CM | POA: Insufficient documentation

## 2022-05-08 DIAGNOSIS — I130 Hypertensive heart and chronic kidney disease with heart failure and stage 1 through stage 4 chronic kidney disease, or unspecified chronic kidney disease: Secondary | ICD-10-CM | POA: Diagnosis not present

## 2022-05-08 DIAGNOSIS — I10 Essential (primary) hypertension: Secondary | ICD-10-CM

## 2022-05-08 DIAGNOSIS — Z79899 Other long term (current) drug therapy: Secondary | ICD-10-CM | POA: Diagnosis not present

## 2022-05-08 DIAGNOSIS — T8450XS Infection and inflammatory reaction due to unspecified internal joint prosthesis, sequela: Secondary | ICD-10-CM | POA: Diagnosis not present

## 2022-05-08 DIAGNOSIS — G4733 Obstructive sleep apnea (adult) (pediatric): Secondary | ICD-10-CM | POA: Insufficient documentation

## 2022-05-08 DIAGNOSIS — Z7901 Long term (current) use of anticoagulants: Secondary | ICD-10-CM | POA: Insufficient documentation

## 2022-05-08 LAB — COMPREHENSIVE METABOLIC PANEL
ALT: 21 U/L (ref 0–44)
AST: 28 U/L (ref 15–41)
Albumin: 3.9 g/dL (ref 3.5–5.0)
Alkaline Phosphatase: 78 U/L (ref 38–126)
Anion gap: 11 (ref 5–15)
BUN: 43 mg/dL — ABNORMAL HIGH (ref 8–23)
CO2: 28 mmol/L (ref 22–32)
Calcium: 9.4 mg/dL (ref 8.9–10.3)
Chloride: 100 mmol/L (ref 98–111)
Creatinine, Ser: 1.7 mg/dL — ABNORMAL HIGH (ref 0.44–1.00)
GFR, Estimated: 31 mL/min — ABNORMAL LOW (ref >60)
Glucose, Bld: 244 mg/dL — ABNORMAL HIGH (ref 70–99)
Potassium: 4.9 mmol/L (ref 3.5–5.1)
Sodium: 139 mmol/L (ref 135–145)
Total Bilirubin: 0.6 mg/dL (ref 0.3–1.2)
Total Protein: 6.8 g/dL (ref 6.5–8.1)

## 2022-05-08 LAB — IRON AND TIBC
Iron: 66 ug/dL (ref 28–170)
Saturation Ratios: 22 % (ref 10.4–31.8)
TIBC: 298 ug/dL (ref 250–450)
UIBC: 232 ug/dL

## 2022-05-08 LAB — FERRITIN: Ferritin: 613 ng/mL — ABNORMAL HIGH (ref 11–307)

## 2022-05-08 LAB — ECHOCARDIOGRAM COMPLETE
AR max vel: 1.29 cm2
AV Area VTI: 1.39 cm2
AV Area mean vel: 1.38 cm2
AV Mean grad: 9 mmHg
AV Peak grad: 15.1 mmHg
Ao pk vel: 1.94 m/s
Area-P 1/2: 2.08 cm2
Calc EF: 43.8 %
MV M vel: 4.08 m/s
MV Peak grad: 66.6 mmHg
P 1/2 time: 466 msec
S' Lateral: 4.3 cm
Single Plane A2C EF: 39 %
Single Plane A4C EF: 54.4 %

## 2022-05-08 LAB — CBC
HCT: 35.6 % — ABNORMAL LOW (ref 36.0–46.0)
Hemoglobin: 10.6 g/dL — ABNORMAL LOW (ref 12.0–15.0)
MCH: 20.7 pg — ABNORMAL LOW (ref 26.0–34.0)
MCHC: 29.8 g/dL — ABNORMAL LOW (ref 30.0–36.0)
MCV: 69.4 fL — ABNORMAL LOW (ref 80.0–100.0)
Platelets: 206 10*3/uL (ref 150–400)
RBC: 5.13 MIL/uL — ABNORMAL HIGH (ref 3.87–5.11)
RDW: 16.4 % — ABNORMAL HIGH (ref 11.5–15.5)
WBC: 6.9 10*3/uL (ref 4.0–10.5)
nRBC: 0 % (ref 0.0–0.2)

## 2022-05-08 NOTE — Patient Instructions (Addendum)
Thank you for coming in today  Labs were done today, if any labs are abnormal the clinic will call you No news is good news  You have been given a prescription for compression hose. And a list of places who supply them. Please wear your compression hose daily, place them on as soon as you get up in the morning and remove before you go to bed at night.   Your physician recommends that you schedule a follow-up appointment in: 4 months with Dr. Haroldine Laws You will receive a reminder letter in the mail a few weeks to months in advance. If you don't receive a letter, please call our office to schedule the follow-up appointment.  TAKE 40 mg of Torsemide for 3 days  05/08/22 05/09/22 05/10/22 and on 05/11/22 go back to 20 mg daily    Do the following things EVERYDAY: Weigh yourself in the morning before breakfast. Write it down and keep it in a log. Take your medicines as prescribed Eat low salt foods--Limit salt (sodium) to 2000 mg per day.  Stay as active as you can everyday Limit all fluids for the day to less than 2 liters  At the [facility], you and your health needs are our priority. As part of our continuing mission to provide you with exceptional heart care, we have created designated Provider Care Teams. These Care Teams include your primary Cardiologist (physician) and Advanced Practice Providers (APPs- Physician Assistants and Nurse Practitioners) who all work together to provide you with the care you need, when you need it.   You may see any of the following providers on your designated Care Team at your next follow up: Dr Glori Bickers Dr Loralie Champagne Dr. Roxana Hires, NP Lyda Jester, [facility] [place], [place] Forestine Na, NP Audry Riles, PharmD   Please be sure to bring in all your medications bottles to every appointment.   If you have any questions or concerns before your next appointment please send [place] a message  through [place] or call our office at [phone].    TO LEAVE A MESSAGE FOR THE NURSE SELECT OPTION 2, PLEASE LEAVE A MESSAGE INCLUDING: YOUR NAME DATE OF BIRTH CALL BACK NUMBER REASON FOR CALL**this is important as we prioritize the call backs  YOU WILL RECEIVE A CALL BACK THE SAME DAY AS LONG AS YOU CALL BEFORE 4:00 PM

## 2022-05-08 NOTE — Progress Notes (Signed)
Paramedicine Encounter    Patient ID: Danielle [name], female    DOB: [birth year], 76 y.o.   MRN: [id number]   Met with Zriyah in the clinic today where she was seen by Allena Katz, NP. Sora denied any current shortness of breath, dizziness or chest pain but did state she has been feeling fatigued and having little to no energy.   ECHO reading 45-50% today   Torsemide increased for 3 days to 24m daily. New RX given for compression socks and gator applicator.    Meds reviewed and confirmed pill box filled for one week. Refills as noted: Spironolactone (called into [facility])  Morna will report back to see Dr. BHaroldine Lawsin 3-4 months.   Dexcom replaced today by me in clinic.    Clinic visit complete.  I will follow up in the home in one week.   HSalena Saner EDutchess12/04/2022          Patient Care Team: PSeward Carol MD as PCP - General (Internal Medicine) CConstance Haw MD as PCP - Electrophysiology (Cardiology) TSueanne Margarita MD as PCP - Sleep Medicine (Cardiology) Bensimhon, DShaune Pascal MD as PCP - Cardiology (Cardiology) UJorge Ny LCSW as Social Worker (Licensed Clinical Social Worker) Shamleffer, IMelanie Crazier MD as Consulting Physician (Endocrinology)  Patient Active Problem List   Diagnosis Date Noted   Type 2 diabetes mellitus with stage 3a chronic kidney disease, with long-term current use of insulin ([facility] 02/16/2022   Prosthetic joint implant failure, initial encounter ([facility] 12/20/2021   Pseudophakia of both eyes 11/15/2021   DVT (deep venous thrombosis) ([facility] 07/31/2021   Acute blood loss anemia    Septic Arthritis, Infection of prosthetic right knee joint ([facility], MSSA infection 046/96/2952  Complication of internal right knee prosthesis ([facility] 07/10/2021   CAD (coronary artery disease) 06/08/2021   Carotid artery disease ([facility] 06/08/2021   Symptomatic bradycardia 06/08/2021   Paroxysmal atrial fibrillation  ([facility] 06/08/2021   CKD (chronic kidney disease), stage III ([facility] 06/08/2021   Polyneuropathy associated with underlying disease ([facility] 05/04/2020   Severe nonproliferative diabetic retinopathy of left eye, with macular edema, associated with type 2 diabetes mellitus ([facility] 11/05/2019   Severe nonproliferative diabetic retinopathy of right eye, with macular edema, associated with type 2 diabetes mellitus ([facility] 11/05/2019   Retinal hemorrhage of right eye 11/05/2019   Retinal hemorrhage of left eye 11/05/2019   Diabetes mellitus ([facility] 05/14/2019   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, with long-term current use of insulin ([facility] 05/14/2019   Type 2 diabetes mellitus with diabetic polyneuropathy, with long-term current use of insulin ([facility] 05/14/2019   History of gout 05/20/2018   Primary localized osteoarthritis of right knee 05/13/2018   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) 12/20/2017   Obesity (BMI 30-39.9) 11/11/2017   OSA (obstructive sleep apnea) 02/18/2017   Essential hypertension    L Breast Cancer    Hypothyroidism     Current Outpatient Medications:    acetaminophen (TYLENOL) 500 MG tablet, Take 500-1,000 mg by mouth every 6 (six) hours as needed (for pain or headaches)., Disp: , Rfl:    allopurinol (ZYLOPRIM) 100 MG tablet, Take 100 mg by mouth daily., Disp: , Rfl:    apixaban (ELIQUIS) 5 MG TABS tablet, Take 1 tablet (5 mg total) by mouth 2 (two) times daily., Disp: 60 tablet, Rfl: 11   atorvastatin (LIPITOR) 80 MG tablet, Take 1 tablet (80 mg total) by mouth at bedtime., Disp: 90 tablet, Rfl: 3  Biotin 10 MG TABS, Take 10 mg by mouth daily., Disp: , Rfl:    Blood Glucose Monitoring Suppl (ONETOUCH VERIO) w/Device KIT, Use to check blood sugar, Disp: 1 kit, Rfl: 0   carvedilol (COREG) 3.125 MG tablet, Take 1 tablet (3.125 mg total) by mouth 2 (two) times daily with a meal., Disp: 60 tablet, Rfl: 6   celecoxib (CELEBREX) 100 MG capsule, Take 100 mg by  mouth 2 (two) times daily., Disp: , Rfl:    clotrimazole-betamethasone (LOTRISONE) lotion, Apply 1 application topically 2 (two) times daily as needed (itching)., Disp: , Rfl:    Continuous Blood Gluc Sensor (DEXCOM G6 SENSOR) MISC, 1 Device by Does not apply route as directed., Disp: 9 each, Rfl: 3   Continuous Blood Gluc Transmit (DEXCOM G6 TRANSMITTER) MISC, 1 Device by Does not apply route as directed., Disp: 1 each, Rfl: 3   empagliflozin (JARDIANCE) 10 MG TABS tablet, Take 1 tablet (10 mg total) by mouth daily., Disp: 30 tablet, Rfl: 11   Ferrous Sulfate (IRON) 325 (65 Fe) MG TABS, Take 1 tablet (325 mg total) by mouth 2 (two) times daily before a meal., Disp: 180 tablet, Rfl: 3   folic acid (FOLVITE) 1 MG tablet, Take 1 tablet (1 mg total) by mouth daily., Disp: 30 tablet, Rfl: 2   gabapentin (NEURONTIN) 300 MG capsule, Take 300 mg by mouth 2 (two) times daily., Disp: , Rfl:    Insulin Glargine (BASAGLAR KWIKPEN) 100 UNIT/ML, Inject 45 Units into the skin at bedtime., Disp: 45 mL, Rfl: 0   insulin lispro (HUMALOG) 100 UNIT/ML injection, Inject 0.05 mLs (5 Units total) into the skin 3 (three) times daily before meals., Disp: 10 mL, Rfl: 0   latanoprost (XALATAN) 0.005 % ophthalmic solution, Place 1 drop into both eyes at bedtime., Disp: , Rfl:    levothyroxine (SYNTHROID, LEVOTHROID) 75 MCG tablet, Take 75 mcg by mouth daily before breakfast., Disp: , Rfl:    losartan (COZAAR) 25 MG tablet, Take 1 tablet (25 mg total) by mouth daily., Disp: 30 tablet, Rfl: 6   LUMIGAN 0.01 % SOLN, Place 1 drop into both eyes at bedtime., Disp: , Rfl:    Multiple Vitamins-Minerals (ALIVE ONCE DAILY WOMENS 50+ PO), Take 1 tablet by mouth daily., Disp: , Rfl:    spironolactone (ALDACTONE) 25 MG tablet, Take 0.5 tablets (12.5 mg total) by mouth daily., Disp: 45 tablet, Rfl: 3   torsemide (DEMADEX) 20 MG tablet, Take 1 tablet (20 mg total) by mouth as needed. (Patient taking differently: Take 20 mg by mouth daily as  needed (edema).), Disp: 30 tablet, Rfl: 11   Travoprost, BAK Free, (TRAVATAN) 0.004 % SOLN ophthalmic solution, Place 1 drop into both eyes at bedtime., Disp: , Rfl:  Allergies  Allergen Reactions   Codeine Other (See Comments)   Other Other (See Comments)    Blood Product Refusal (Jehovah's witness)     Tape Other (See Comments)    Leaves blisters and marks on the skin   Sulfa Antibiotics Rash   Uloric [Febuxostat] Rash     Social History   Socioeconomic History   Marital status: Widowed    Spouse name: Not on file   Number of children: 3   Years of education: Not on file   Highest education level: Some college, no degree  Occupational History   Occupation: retired  Tobacco Use   Smoking status: Former    Packs/day: 1.00    Years: 22.00    Total pack years:  22.00    Types: Cigarettes    Quit date: 31    Years since quitting: 40.9   Smokeless tobacco: Never  Vaping Use   Vaping Use: Never used  Substance and Sexual Activity   Alcohol use: Yes    Comment: 05/22/2018 "glass of wine couple times/wk"   Drug use: Not Currently   Sexual activity: Not Currently    Birth control/protection: None, Post-menopausal  Other Topics Concern   Not on file  Social History Narrative   Not on file   Social Determinants of Health   Financial Resource Strain: Medium Risk (12/22/2021)   Overall Financial Resource Strain (CARDIA)    Difficulty of Paying Living Expenses: Somewhat hard  Food Insecurity: No Food Insecurity (05/08/2018)   Hunger Vital Sign    Worried About Running Out of Food in the Last Year: Never true    Ran Out of Food in the Last Year: Never true  Transportation Needs: No Transportation Needs (05/08/2018)   PRAPARE - Hydrologist (Medical): No    Lack of Transportation (Non-Medical): No  Physical Activity: Not on file  Stress: Not on file  Social Connections: Not on file  Intimate Partner Violence: Not on file    Physical  Exam      Future Appointments  Date Time Provider [place]  05/08/2022 11:00 AM [facility] PA/NP [facility] None  05/29/2022 11:20 AM [facility] MM 2 [facility]  08/16/2022 10:30 AM Shamleffer, Melanie Crazier, MD [facility] None     ACTION: Home visit completed

## 2022-05-08 NOTE — Progress Notes (Signed)
  Echocardiogram 2D Echocardiogram has been performed.  Danielle [name] 05/08/2022, 9:53 AM

## 2022-05-09 ENCOUNTER — Telehealth (HOSPITAL_COMMUNITY): Payer: Self-pay

## 2022-05-09 NOTE — Telephone Encounter (Signed)
Notified by Allena Katz NP about Danielle [name]'s kidney function being elevated on labs and to not take the extra torsemide as originally instructed in clinic on 12/12. I called Danielle [name] to make her aware of the change and talked her through taking out the extra torsemide in her pill box that I had placed. She was able to do identify the extra torsemide for Thursday and Friday mornings and remove same. She is aware labs will be re taken in 10-14 days. I will go out for home visit next week. Call complete.   Danielle [name], [place] 05/09/2022

## 2022-05-10 ENCOUNTER — Other Ambulatory Visit (HOSPITAL_COMMUNITY): Payer: Self-pay

## 2022-05-10 ENCOUNTER — Telehealth (HOSPITAL_COMMUNITY): Payer: Self-pay

## 2022-05-10 DIAGNOSIS — I5022 Chronic systolic (congestive) heart failure: Secondary | ICD-10-CM

## 2022-05-10 NOTE — Telephone Encounter (Signed)
Patient aware and agreeable. Labs ordered and scheduled. 

## 2022-05-11 DIAGNOSIS — E1165 Type 2 diabetes mellitus with hyperglycemia: Secondary | ICD-10-CM | POA: Diagnosis not present

## 2022-05-15 ENCOUNTER — Other Ambulatory Visit (HOSPITAL_COMMUNITY): Payer: Self-pay

## 2022-05-15 ENCOUNTER — Other Ambulatory Visit: Payer: Self-pay | Admitting: Internal Medicine

## 2022-05-15 NOTE — Progress Notes (Signed)
Paramedicine Encounter    Patient ID: Danielle [name], female    DOB: 1946/01/19, 76 y.o.   MRN: [id number]    Arrived for home visit and is feeling okay overall aside from a gout flare up she is actively dealing with. She is taking prednisone she has left over for this and drinking cherry juice. She knows to reach out to me if this does not resolve.   I obtained vitals as noted: WT- 205lbs BP-110/62 HR- 70 O2- 97% RR- 16 CBG- 208 Lungs- clear Swelling- minimal (improved from last week)  I reviewed meds and noted no missed doses over the last week. I filled pill box for two weeks. Refills as noted: -Torsemide -Folic Acid (needs new RX) -Insulin (needs new RX) -Spironolactone -Losartan  We reviewed upcoming appointments and confirmed same. I planned to come out in one week on Thursday to replace dexcom and missing pills needed from pharmacy.   HF education provided where we discussed diet and medications regimen.   Home visit complete.   [name], [place] 05/15/2022         Patient Care Team: Seward Carol, MD as PCP - General (Internal Medicine) Constance Haw, MD as PCP - Electrophysiology (Cardiology) Sueanne Margarita, MD as PCP - Sleep Medicine (Cardiology) Bensimhon, Shaune Pascal, MD as PCP - Cardiology (Cardiology) Jorge Ny, LCSW as Social Worker (Licensed Clinical Social Worker) Shamleffer, Melanie Crazier, MD as Consulting Physician (Endocrinology)  Patient Active Problem List   Diagnosis Date Noted   Chronic systolic heart failure ([place]) 05/08/2022   Type 2 diabetes mellitus with stage 3a chronic kidney disease, with long-term current use of insulin ([place]) 02/16/2022   Prosthetic joint implant failure, initial encounter ([place]) 12/20/2021   Pseudophakia of both eyes 11/15/2021   DVT (deep venous thrombosis) ([place]) 07/31/2021   Acute blood loss anemia    Septic Arthritis, Infection of prosthetic right knee joint ([place]), MSSA  infection 27/25/3664   Complication of internal right knee prosthesis ([place]) 07/10/2021   CAD (coronary artery disease) 06/08/2021   Carotid artery disease ([place]) 06/08/2021   Symptomatic bradycardia 06/08/2021   Paroxysmal atrial fibrillation ([place]) 06/08/2021   CKD (chronic kidney disease), stage III ([place]) 06/08/2021   Polyneuropathy associated with underlying disease ([place]) 05/04/2020   Severe nonproliferative diabetic retinopathy of left eye, with macular edema, associated with type 2 diabetes mellitus ([place]) 11/05/2019   Severe nonproliferative diabetic retinopathy of right eye, with macular edema, associated with type 2 diabetes mellitus ([place]) 11/05/2019   Retinal hemorrhage of right eye 11/05/2019   Retinal hemorrhage of left eye 11/05/2019   Diabetes mellitus ([place]) 05/14/2019   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, with long-term current use of insulin ([place]) 05/14/2019   Type 2 diabetes mellitus with diabetic polyneuropathy, with long-term current use of insulin ([place]) 05/14/2019   History of gout 05/20/2018   Primary localized osteoarthritis of right knee 05/13/2018   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) 12/20/2017   Obesity (BMI 30-39.9) 11/11/2017   OSA (obstructive sleep apnea) 02/18/2017   Essential hypertension    L Breast Cancer    Hypothyroidism     Current Outpatient Medications:    acetaminophen (TYLENOL) 500 MG tablet, Take 500-1,000 mg by mouth every 6 (six) hours as needed (for pain or headaches)., Disp: , Rfl:    allopurinol (ZYLOPRIM) 100 MG tablet, Take 100 mg by mouth daily., Disp: , Rfl:    apixaban (ELIQUIS) 5 MG TABS tablet, Take 1 tablet (5  mg total) by mouth 2 (two) times daily., Disp: 60 tablet, Rfl: 11   atorvastatin (LIPITOR) 80 MG tablet, Take 1 tablet (80 mg total) by mouth at bedtime., Disp: 90 tablet, Rfl: 3   Biotin 10 MG TABS, Take 10 mg by mouth daily., Disp: , Rfl:    Blood Glucose Monitoring Suppl (ONETOUCH VERIO) w/Device  KIT, Use to check blood sugar, Disp: 1 kit, Rfl: 0   carvedilol (COREG) 3.125 MG tablet, Take 1 tablet (3.125 mg total) by mouth 2 (two) times daily with a meal., Disp: 60 tablet, Rfl: 6   celecoxib (CELEBREX) 100 MG capsule, Take 100 mg by mouth 2 (two) times daily., Disp: , Rfl:    clotrimazole-betamethasone (LOTRISONE) lotion, Apply 1 application topically 2 (two) times daily as needed (itching)., Disp: , Rfl:    Continuous Blood Gluc Sensor (DEXCOM G6 SENSOR) MISC, 1 Device by Does not apply route as directed., Disp: 9 each, Rfl: 3   empagliflozin (JARDIANCE) 10 MG TABS tablet, Take 1 tablet (10 mg total) by mouth daily., Disp: 30 tablet, Rfl: 11   Ferrous Sulfate (IRON) 325 (65 Fe) MG TABS, Take 1 tablet (325 mg total) by mouth 2 (two) times daily before a meal., Disp: 180 tablet, Rfl: 3   folic acid (FOLVITE) 1 MG tablet, Take 1 tablet (1 mg total) by mouth daily., Disp: 30 tablet, Rfl: 2   gabapentin (NEURONTIN) 300 MG capsule, Take 300 mg by mouth 2 (two) times daily., Disp: , Rfl:    Insulin Glargine (BASAGLAR KWIKPEN) 100 UNIT/ML, Inject 45 Units into the skin at bedtime., Disp: 45 mL, Rfl: 0   insulin lispro (HUMALOG) 100 UNIT/ML injection, Inject 0.05 mLs (5 Units total) into the skin 3 (three) times daily before meals., Disp: 10 mL, Rfl: 0   latanoprost (XALATAN) 0.005 % ophthalmic solution, Place 1 drop into both eyes at bedtime., Disp: , Rfl:    levothyroxine (SYNTHROID, LEVOTHROID) 75 MCG tablet, Take 75 mcg by mouth daily before breakfast., Disp: , Rfl:    losartan (COZAAR) 25 MG tablet, Take 1 tablet (25 mg total) by mouth daily., Disp: 30 tablet, Rfl: 6   LUMIGAN 0.01 % SOLN, Place 1 drop into both eyes at bedtime., Disp: , Rfl:    Multiple Vitamins-Minerals (ALIVE ONCE DAILY WOMENS 50+ PO), Take 1 tablet by mouth daily., Disp: , Rfl:    spironolactone (ALDACTONE) 25 MG tablet, Take 0.5 tablets (12.5 mg total) by mouth daily., Disp: 45 tablet, Rfl: 3   torsemide (DEMADEX) 20 MG  tablet, Take 1 tablet (20 mg total) by mouth as needed. (Patient taking differently: Take 20 mg by mouth daily as needed (edema).), Disp: 30 tablet, Rfl: 11   Travoprost, BAK Free, (TRAVATAN) 0.004 % SOLN ophthalmic solution, Place 1 drop into both eyes at bedtime., Disp: , Rfl:  Allergies  Allergen Reactions   Codeine Other (See Comments)   Other Other (See Comments)    Blood Product Refusal (Jehovah's witness)     Tape Other (See Comments)    Leaves blisters and marks on the skin   Sulfa Antibiotics Rash   Uloric [Febuxostat] Rash     Social History   Socioeconomic History   Marital status: Widowed    Spouse name: Not on file   Number of children: 3   Years of education: Not on file   Highest education level: Some college, no degree  Occupational History   Occupation: retired  Tobacco Use   Smoking status: Former  Packs/day: 1.00    Years: 22.00    Total pack years: 22.00    Types: Cigarettes    Quit date: 34    Years since quitting: 40.9   Smokeless tobacco: Never  Vaping Use   Vaping Use: Never used  Substance and Sexual Activity   Alcohol use: Yes    Comment: 05/22/2018 "glass of wine couple times/wk"   Drug use: Not Currently   Sexual activity: Not Currently    Birth control/protection: None, Post-menopausal  Other Topics Concern   Not on file  Social History Narrative   Not on file   Social Determinants of Health   Financial Resource Strain: Medium Risk (12/22/2021)   Overall Financial Resource Strain (CARDIA)    Difficulty of Paying Living Expenses: Somewhat hard  Food Insecurity: No Food Insecurity (05/08/2018)   Hunger Vital Sign    Worried About Running Out of Food in the Last Year: Never true    Ran Out of Food in the Last Year: Never true  Transportation Needs: Unmet Transportation Needs (05/08/2022)   PRAPARE - Hydrologist (Medical): Yes    Lack of Transportation (Non-Medical): Yes  Physical Activity: Not on  file  Stress: Not on file  Social Connections: Not on file  Intimate Partner Violence: Not on file    Physical Exam      Future Appointments  Date Time Provider [place]  05/22/2022  2:45 PM [facility] None  05/29/2022 11:20 AM [facility] 2 [facility]  08/16/2022 10:30 AM Shamleffer, Melanie Crazier, MD [facility] None     ACTION: Home visit completed

## 2022-05-16 ENCOUNTER — Telehealth (HOSPITAL_COMMUNITY): Payer: Self-pay

## 2022-05-16 MED ORDER — FOLIC ACID 1 MG PO TABS: 1.0000 mg | ORAL_TABLET | Freq: Every day | ORAL | 2 refills | Status: DC

## 2022-05-16 NOTE — Telephone Encounter (Signed)
Meds ordered this encounter  Medications   folic acid (FOLVITE) 1 MG tablet    Sig: Take 1 tablet (1 mg total) by mouth daily.    Dispense:  30 tablet    Refill:  2

## 2022-05-16 NOTE — Telephone Encounter (Signed)
-----   Message from [name], EMT-P sent at 05/15/2022  6:11 PM EST ----- Regarding: refill Mrs. Natzke needs refills for Folic Acid sent to [facility] [address]. Thanks!

## 2022-05-22 ENCOUNTER — Ambulatory Visit (HOSPITAL_COMMUNITY)
Admission: RE | Admit: 2022-05-22 | Discharge: 2022-05-22 | Disposition: A | Discharge status: Home / Self Care | Payer: Medicare HMO | Source: Ambulatory Visit | Attending: Internal Medicine | Admitting: Internal Medicine

## 2022-05-22 DIAGNOSIS — I5022 Chronic systolic (congestive) heart failure: Secondary | ICD-10-CM | POA: Insufficient documentation

## 2022-05-22 LAB — BASIC METABOLIC PANEL
Anion gap: 12 (ref 5–15)
BUN: 51 mg/dL — ABNORMAL HIGH (ref 8–23)
CO2: 28 mmol/L (ref 22–32)
Calcium: 9.3 mg/dL (ref 8.9–10.3)
Chloride: 99 mmol/L (ref 98–111)
Creatinine, Ser: 1.57 mg/dL — ABNORMAL HIGH (ref 0.44–1.00)
GFR, Estimated: 34 mL/min — ABNORMAL LOW (ref >60)
Glucose, Bld: 185 mg/dL — ABNORMAL HIGH (ref 70–99)
Potassium: 4.5 mmol/L (ref 3.5–5.1)
Sodium: 139 mmol/L (ref 135–145)

## 2022-05-23 ENCOUNTER — Other Ambulatory Visit (HOSPITAL_COMMUNITY): Payer: Self-pay

## 2022-05-23 NOTE — Progress Notes (Signed)
Paramedicine Encounter    Patient ID: Danielle [name], female    DOB: 11/13/1945, 76 y.o.   MRN: [id number]   Arrived for home visit for Danielle [name].  She reports feeling well, she was eating dinner on my arrival. She denied any episodes of shortness of breath, chest pain or dizziness. She says her leg swelling has improved. Minimal swelling noted. Labs taken yesterday- [facility] clinic reports no changes needed at present.    I reviewed meds and noted she had missed a few doses of medications over the last week and was trying to re-fill her pill box but when I checked for accuracy it was incorrect. I redid pill boxes for two weeks.   Refills as noted: Celebrex Jardiance Carvedilol  These will be called into [facility] next week.   I reminded her of fluid and sodium intake suggestions as well as med compliance and stressed that I am available if she has questions or concerns.   I replaced dexcom G7 on left arm. Connected and synced same to phone successfully.    She verbalized understanding. Home visit complete. I will return in 2 weeks for next home visit.   Heather Spencer, EMT-Paramedic [phone] 05/23/2022   Patient Care Team: Polite, Ronald, MD as PCP - General (Internal Medicine) Camnitz, Will Martin, MD as PCP - Electrophysiology (Cardiology) Turner, Traci R, MD as PCP - Sleep Medicine (Cardiology) Bensimhon, Daniel R, MD as PCP - Cardiology (Cardiology) Uris, Jenna H, LCSW as Social Worker (Licensed Clinical Social Worker) Shamleffer, Ibtehal Jaralla, MD as Consulting Physician (Endocrinology)  Patient Active Problem List   Diagnosis Date Noted   Chronic systolic heart failure (HCC) 05/08/2022   Type 2 diabetes mellitus with stage 3a chronic kidney disease, with long-term current use of insulin (HCC) 02/16/2022   Prosthetic joint implant failure, initial encounter ([facility]) 12/20/2021   Pseudophakia of both eyes 11/15/2021   DVT (deep venous thrombosis) (HCC) 07/31/2021   Acute  blood loss anemia    Septic Arthritis, Infection of prosthetic right knee joint (HCC), MSSA infection 07/10/2021   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) 07/10/2021   CAD (coronary artery disease) 06/08/2021   Carotid artery disease (HCC) 06/08/2021   Symptomatic bradycardia 06/08/2021   Paroxysmal atrial fibrillation (HCC) 06/08/2021   CKD (chronic kidney disease), stage III (HCC) 06/08/2021   Polyneuropathy associated with underlying disease (HCC) 05/04/2020   Severe nonproliferative diabetic retinopathy of left eye, with macular edema, associated with type 2 diabetes mellitus (HCC) 11/05/2019   Severe nonproliferative diabetic retinopathy of right eye, with macular edema, associated with type 2 diabetes mellitus (HCC) 11/05/2019   Retinal hemorrhage of right eye 11/05/2019   Retinal hemorrhage of left eye 11/05/2019   Diabetes mellitus (HCC) 05/14/2019   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, with long-term current use of insulin (HCC) 05/14/2019   Type 2 diabetes mellitus with diabetic polyneuropathy, with long-term current use of insulin (HCC) 05/14/2019   History of gout 05/20/2018   Primary localized osteoarthritis of right knee 05/13/2018   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) 12/20/2017   Obesity (BMI 30-39.9) 11/11/2017   OSA (obstructive sleep apnea) 02/18/2017   Essential hypertension    L Breast Cancer    Hypothyroidism     Current Outpatient Medications:    acetaminophen (TYLENOL) 500 MG tablet, Take 500-1,000 mg by mouth every 6 (six) hours as needed (for pain or headaches)., Disp: , Rfl:    allopurinol (ZYLOPRIM) 100 MG tablet, Take 100 mg by mouth   daily., Disp: , Rfl:    apixaban (ELIQUIS) 5 MG TABS tablet, Take 1 tablet (5 mg total) by mouth 2 (two) times daily., Disp: 60 tablet, Rfl: 11   atorvastatin (LIPITOR) 80 MG tablet, Take 1 tablet (80 mg total) by mouth at bedtime., Disp: 90 tablet, Rfl: 3   Biotin 10 MG TABS, Take 10  mg by mouth daily., Disp: , Rfl:    Blood Glucose Monitoring Suppl (ONETOUCH VERIO) w/Device KIT, Use to check blood sugar, Disp: 1 kit, Rfl: 0   carvedilol (COREG) 3.125 MG tablet, Take 1 tablet (3.125 mg total) by mouth 2 (two) times daily with a meal., Disp: 60 tablet, Rfl: 6   celecoxib (CELEBREX) 100 MG capsule, Take 100 mg by mouth 2 (two) times daily., Disp: , Rfl:    clotrimazole-betamethasone (LOTRISONE) lotion, Apply 1 application topically 2 (two) times daily as needed (itching)., Disp: , Rfl:    Continuous Blood Gluc Sensor (DEXCOM G6 SENSOR) MISC, 1 Device by Does not apply route as directed., Disp: 9 each, Rfl: 3   empagliflozin (JARDIANCE) 10 MG TABS tablet, Take 1 tablet (10 mg total) by mouth daily., Disp: 30 tablet, Rfl: 11   Ferrous Sulfate (IRON) 325 (65 Fe) MG TABS, Take 1 tablet (325 mg total) by mouth 2 (two) times daily before a meal., Disp: 180 tablet, Rfl: 3   folic acid (FOLVITE) 1 MG tablet, Take 1 tablet (1 mg total) by mouth daily., Disp: 30 tablet, Rfl: 2   gabapentin (NEURONTIN) 300 MG capsule, Take 300 mg by mouth 2 (two) times daily., Disp: , Rfl:    Insulin Glargine (BASAGLAR KWIKPEN) 100 UNIT/ML, Inject 36 Units into the skin at bedtime., Disp: 45 mL, Rfl: 0   insulin lispro (HUMALOG) 100 UNIT/ML injection, Inject 0.05 mLs (5 Units total) into the skin 3 (three) times daily before meals., Disp: 10 mL, Rfl: 0   latanoprost (XALATAN) 0.005 % ophthalmic solution, Place 1 drop into both eyes at bedtime., Disp: , Rfl:    levothyroxine (SYNTHROID, LEVOTHROID) 75 MCG tablet, Take 75 mcg by mouth daily before breakfast., Disp: , Rfl:    losartan (COZAAR) 25 MG tablet, Take 1 tablet (25 mg total) by mouth daily., Disp: 30 tablet, Rfl: 6   LUMIGAN 0.01 % SOLN, Place 1 drop into both eyes at bedtime., Disp: , Rfl:    Multiple Vitamins-Minerals (ALIVE ONCE DAILY WOMENS 50+ PO), Take 1 tablet by mouth daily., Disp: , Rfl:    spironolactone (ALDACTONE) 25 MG tablet, Take 0.5  tablets (12.5 mg total) by mouth daily., Disp: 45 tablet, Rfl: 3   torsemide (DEMADEX) 20 MG tablet, Take 1 tablet (20 mg total) by mouth as needed. (Patient taking differently: Take 20 mg by mouth daily as needed (edema).), Disp: 30 tablet, Rfl: 11   Travoprost, BAK Free, (TRAVATAN) 0.004 % SOLN ophthalmic solution, Place 1 drop into both eyes at bedtime., Disp: , Rfl:  Allergies  Allergen Reactions   Codeine Other (See Comments)   Other Other (See Comments)    Blood Product Refusal (Jehovah's witness)     Tape Other (See Comments)    Leaves blisters and marks on the skin   Sulfa Antibiotics Rash   Uloric [Febuxostat] Rash     Social History   Socioeconomic History   Marital status: Widowed    Spouse name: Not on file   Number of children: 3   Years of education: Not on file   Highest education level: Some college, no degree    Occupational History   Occupation: retired  Tobacco Use   Smoking status: Former    Packs/day: 1.00    Years: 22.00    Total pack years: 22.00    Types: Cigarettes    Quit date: 1983    Years since quitting: 41.0   Smokeless tobacco: Never  Vaping Use   Vaping Use: Never used  Substance and Sexual Activity   Alcohol use: Yes    Comment: 05/22/2018 "glass of wine couple times/wk"   Drug use: Not Currently   Sexual activity: Not Currently    Birth control/protection: None, Post-menopausal  Other Topics Concern   Not on file  Social History Narrative   Not on file   Social Determinants of Health   Financial Resource Strain: Medium Risk (12/22/2021)   Overall Financial Resource Strain (CARDIA)    Difficulty of Paying Living Expenses: Somewhat hard  Food Insecurity: No Food Insecurity (05/08/2018)   Hunger Vital Sign    Worried About Running Out of Food in the Last Year: Never true    Ran Out of Food in the Last Year: Never true  Transportation Needs: Unmet Transportation Needs (05/08/2022)   PRAPARE - Transportation    Lack of  Transportation (Medical): Yes    Lack of Transportation (Non-Medical): Yes  Physical Activity: Not on file  Stress: Not on file  Social Connections: Not on file  Intimate Partner Violence: Not on file    Physical Exam      Future Appointments  Date Time Provider Department Center  05/29/2022 11:20 AM [facility] MM 2 [facility]  08/16/2022 10:30 AM Shamleffer, Ibtehal Jaralla, MD [facility] None     ACTION: Home visit completed       

## 2022-05-25 NOTE — Progress Notes (Signed)
Remote pacemaker transmission.   

## 2022-05-29 ENCOUNTER — Ambulatory Visit: Payer: Medicare HMO

## 2022-05-31 ENCOUNTER — Telehealth (HOSPITAL_COMMUNITY): Payer: Self-pay

## 2022-05-31 NOTE — Telephone Encounter (Signed)
Called in the following refills to [facility] for Danielle [name]: -Celebrex (out of refills) -Jardiance -Carvedilol   Same will be ready on 06/01/22. I texted Danielle [name] to update her.   Danielle [name], [place] 05/31/2022

## 2022-06-04 ENCOUNTER — Other Ambulatory Visit: Payer: Self-pay | Admitting: Internal Medicine

## 2022-06-04 ENCOUNTER — Other Ambulatory Visit (HOSPITAL_COMMUNITY): Payer: Self-pay

## 2022-06-04 DIAGNOSIS — E1165 Type 2 diabetes mellitus with hyperglycemia: Secondary | ICD-10-CM

## 2022-06-04 NOTE — Progress Notes (Signed)
Paramedicine Encounter    Patient ID: Danielle [name], female    DOB: Jul 21, 1945, 77 y.o.   MRN: [id number]   Arrived for home visit for [place]ion who reports to be feeling okay overall. She denied shortness of breath, dizziness or swelling. She states she has been taking her medications over the last few weeks but might have missed a few doses. I reviewed pill box and it appears she has missed 3-4 doses over two weeks.  I discussed med compliance with Jaree- we have agreed to adjust visits to weekly for her to stay on track as >1 week she gets easily confused.   I reviewed meds and filled pill box for one week. Refills called into [facility] as noted: -One Touch Verio Flex Lancets  -Latanoprost eyedrops  -Jardiance -Carvedilol -Spironolactone  -Eliquis (novartis)  I obtained vitals as noted: WT- 201lbs BP- 132/80 HR- 76 O2- 96% RR- 16 CBG- 372  Lungs- clear Edema- minimal   CBG elevated. [name] has not had her dexcom on for >1 week, she said it fell off and she didn't know how to put on. She also couldn't locate her glucometer to check her sugar so she has missed several doses of her insulin.   I applied new dexcom sensor today, I also checked sugar manually and we reviewed insulin instructions. She took 5 units of humalog during our visit. We discussed DM education and need for closer attention.     [name] reports her insurance company has a doctor who will be coming out to the house next week for a home visit. She does not recall the time. I left a detailed list of medications matching her list for them to review if needed.   We discussed HF and DM management at length today and I wrote down instructions on helping her stay on track.   I will plan to see her in one week, she agreed with plan. I called in refills for lancets for manual glucometer incase dexcom fails and she knows to call me if so.   Home visit complete.   [name],  [place] 06/04/2022        Patient Care Team: Seward Carol, MD as PCP - General (Internal Medicine) Constance Haw, MD as PCP - Electrophysiology (Cardiology) Sueanne Margarita, MD as PCP - Sleep Medicine (Cardiology) Bensimhon, Shaune Pascal, MD as PCP - Cardiology (Cardiology) Jorge Ny, LCSW as Social Worker (Licensed Clinical Social Worker) Shamleffer, Melanie Crazier, MD as Consulting Physician (Endocrinology)  Patient Active Problem List   Diagnosis Date Noted   Chronic systolic heart failure ([place]) 05/08/2022   Type 2 diabetes mellitus with stage 3a chronic kidney disease, with long-term current use of insulin ([place]) 02/16/2022   Prosthetic joint implant failure, initial encounter ([place]) 12/20/2021   Pseudophakia of both eyes 11/15/2021   DVT (deep venous thrombosis) ([place]) 07/31/2021   Acute blood loss anemia    Septic Arthritis, Infection of prosthetic right knee joint ([place]), MSSA infection 79/89/2119   Complication of internal right knee prosthesis ([place]) 07/10/2021   CAD (coronary artery disease) 06/08/2021   Carotid artery disease ([place]) 06/08/2021   Symptomatic bradycardia 06/08/2021   Paroxysmal atrial fibrillation ([place]) 06/08/2021   CKD (chronic kidney disease), stage III ([place]) 06/08/2021   Polyneuropathy associated with underlying disease ([place]) 05/04/2020   Severe nonproliferative diabetic retinopathy of left eye, with macular edema, associated with type 2 diabetes mellitus ([place]) 11/05/2019   Severe nonproliferative diabetic retinopathy of right eye, with macular edema,  associated with type 2 diabetes mellitus ([place]) 11/05/2019   Retinal hemorrhage of right eye 11/05/2019   Retinal hemorrhage of left eye 11/05/2019   Diabetes mellitus ([place]) 05/14/2019   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, with long-term current use of insulin ([place]) 05/14/2019   Type 2 diabetes mellitus with diabetic polyneuropathy, with long-term current use  of insulin ([place]) 05/14/2019   History of gout 05/20/2018   Primary localized osteoarthritis of right knee 05/13/2018   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) 12/20/2017   Obesity (BMI 30-39.9) 11/11/2017   OSA (obstructive sleep apnea) 02/18/2017   Essential hypertension    L Breast Cancer    Hypothyroidism     Current Outpatient Medications:    acetaminophen (TYLENOL) 500 MG tablet, Take 500-1,000 mg by mouth every 6 (six) hours as needed (for pain or headaches)., Disp: , Rfl:    allopurinol (ZYLOPRIM) 100 MG tablet, Take 100 mg by mouth daily., Disp: , Rfl:    apixaban (ELIQUIS) 5 MG TABS tablet, Take 1 tablet (5 mg total) by mouth 2 (two) times daily., Disp: 60 tablet, Rfl: 11   atorvastatin (LIPITOR) 80 MG tablet, Take 1 tablet (80 mg total) by mouth at bedtime., Disp: 90 tablet, Rfl: 3   Biotin 10 MG TABS, Take 10 mg by mouth daily., Disp: , Rfl:    Blood Glucose Monitoring Suppl (ONETOUCH VERIO) w/Device KIT, Use to check blood sugar, Disp: 1 kit, Rfl: 0   carvedilol (COREG) 3.125 MG tablet, Take 1 tablet (3.125 mg total) by mouth 2 (two) times daily with a meal., Disp: 60 tablet, Rfl: 6   celecoxib (CELEBREX) 100 MG capsule, Take 100 mg by mouth 2 (two) times daily., Disp: , Rfl:    clotrimazole-betamethasone (LOTRISONE) lotion, Apply 1 application topically 2 (two) times daily as needed (itching)., Disp: , Rfl:    Continuous Blood Gluc Sensor (DEXCOM G6 SENSOR) MISC, 1 Device by Does not apply route as directed., Disp: 9 each, Rfl: 3   empagliflozin (JARDIANCE) 10 MG TABS tablet, Take 1 tablet (10 mg total) by mouth daily., Disp: 30 tablet, Rfl: 11   Ferrous Sulfate (IRON) 325 (65 Fe) MG TABS, Take 1 tablet (325 mg total) by mouth 2 (two) times daily before a meal., Disp: 180 tablet, Rfl: 3   folic acid (FOLVITE) 1 MG tablet, Take 1 tablet (1 mg total) by mouth daily., Disp: 30 tablet, Rfl: 2   gabapentin (NEURONTIN) 300 MG capsule, Take 300 mg by mouth 2 (two) times daily.,  Disp: , Rfl:    Insulin Glargine (BASAGLAR KWIKPEN) 100 UNIT/ML, Inject 36 Units into the skin at bedtime., Disp: 45 mL, Rfl: 0   insulin lispro (HUMALOG) 100 UNIT/ML injection, Inject 0.05 mLs (5 Units total) into the skin 3 (three) times daily before meals., Disp: 10 mL, Rfl: 0   latanoprost (XALATAN) 0.005 % ophthalmic solution, Place 1 drop into both eyes at bedtime., Disp: , Rfl:    levothyroxine (SYNTHROID, LEVOTHROID) 75 MCG tablet, Take 75 mcg by mouth daily before breakfast., Disp: , Rfl:    losartan (COZAAR) 25 MG tablet, Take 1 tablet (25 mg total) by mouth daily., Disp: 30 tablet, Rfl: 6   LUMIGAN 0.01 % SOLN, Place 1 drop into both eyes at bedtime., Disp: , Rfl:    Multiple Vitamins-Minerals (ALIVE ONCE DAILY WOMENS 50+ PO), Take 1 tablet by mouth daily., Disp: , Rfl:    spironolactone (ALDACTONE) 25 MG tablet, Take 0.5 tablets (12.5 mg total) by mouth  daily., Disp: 45 tablet, Rfl: 3   torsemide (DEMADEX) 20 MG tablet, Take 1 tablet (20 mg total) by mouth as needed. (Patient taking differently: Take 20 mg by mouth daily as needed (edema).), Disp: 30 tablet, Rfl: 11   Travoprost, BAK Free, (TRAVATAN) 0.004 % SOLN ophthalmic solution, Place 1 drop into both eyes at bedtime., Disp: , Rfl:  Allergies  Allergen Reactions   Codeine Other (See Comments)   Other Other (See Comments)    Blood Product Refusal (Jehovah's witness)     Tape Other (See Comments)    Leaves blisters and marks on the skin   Sulfa Antibiotics Rash   Uloric [Febuxostat] Rash     Social History   Socioeconomic History   Marital status: Widowed    Spouse name: Not on file   Number of children: 3   Years of education: Not on file   Highest education level: Some college, no degree  Occupational History   Occupation: retired  Tobacco Use   Smoking status: Former    Packs/day: 1.00    Years: 22.00    Total pack years: 22.00    Types: Cigarettes    Quit date: 1983    Years since quitting: 41.0    Smokeless tobacco: Never  Vaping Use   Vaping Use: Never used  Substance and Sexual Activity   Alcohol use: Yes    Comment: 05/22/2018 "glass of wine couple times/wk"   Drug use: Not Currently   Sexual activity: Not Currently    Birth control/protection: None, Post-menopausal  Other Topics Concern   Not on file  Social History Narrative   Not on file   Social Determinants of Health   Financial Resource Strain: Medium Risk (12/22/2021)   Overall Financial Resource Strain (CARDIA)    Difficulty of Paying Living Expenses: Somewhat hard  Food Insecurity: No Food Insecurity (05/08/2018)   Hunger Vital Sign    Worried About Running Out of Food in the Last Year: Never true    Ran Out of Food in the Last Year: Never true  Transportation Needs: Unmet Transportation Needs (05/08/2022)   PRAPARE - Hydrologist (Medical): Yes    Lack of Transportation (Non-Medical): Yes  Physical Activity: Not on file  Stress: Not on file  Social Connections: Not on file  Intimate Partner Violence: Not on file    Physical Exam      Future Appointments  Date Time Provider [place]  07/16/2022  4:30 PM [facility]  08/16/2022 10:30 AM Shamleffer, Melanie Crazier, MD [facility] None     ACTION: Home visit completed

## 2022-06-10 DIAGNOSIS — E1165 Type 2 diabetes mellitus with hyperglycemia: Secondary | ICD-10-CM | POA: Diagnosis not present

## 2022-06-11 ENCOUNTER — Other Ambulatory Visit (HOSPITAL_COMMUNITY): Payer: Self-pay

## 2022-06-11 NOTE — Progress Notes (Signed)
Paramedicine Encounter  Arrived for med review and pill box fill for Danielle [name]. No vitals were obtained today. Meds reviewed and pill box filled for one week.  CBG- 255  Dexcom replaced- patient took insulin during visit, she has not had same today but did eat prior to be coming out. She has been non-compliant with insulin. We discussed the importance of med compliance and usage of glucometer, dexcom and insulin as well as some diet tips and discussion. She agreed.    Home visit complete. She is aware to reach out to me if needed before our next visit next week.    Refills: Celebrex (waiting for refill at [facility])  Not in pill box  [name], [place] 06/11/2022   ACTION: Home visit completed

## 2022-06-12 DIAGNOSIS — E785 Hyperlipidemia, unspecified: Secondary | ICD-10-CM | POA: Diagnosis not present

## 2022-06-12 DIAGNOSIS — Z008 Encounter for other general examination: Secondary | ICD-10-CM | POA: Diagnosis not present

## 2022-06-12 DIAGNOSIS — I252 Old myocardial infarction: Secondary | ICD-10-CM | POA: Diagnosis not present

## 2022-06-12 DIAGNOSIS — E890 Postprocedural hypothyroidism: Secondary | ICD-10-CM | POA: Diagnosis not present

## 2022-06-12 DIAGNOSIS — E1142 Type 2 diabetes mellitus with diabetic polyneuropathy: Secondary | ICD-10-CM | POA: Diagnosis not present

## 2022-06-12 DIAGNOSIS — Z794 Long term (current) use of insulin: Secondary | ICD-10-CM | POA: Diagnosis not present

## 2022-06-12 DIAGNOSIS — I2510 Atherosclerotic heart disease of native coronary artery without angina pectoris: Secondary | ICD-10-CM | POA: Diagnosis not present

## 2022-06-12 DIAGNOSIS — D6869 Other thrombophilia: Secondary | ICD-10-CM | POA: Diagnosis not present

## 2022-06-12 DIAGNOSIS — H409 Unspecified glaucoma: Secondary | ICD-10-CM | POA: Diagnosis not present

## 2022-06-12 DIAGNOSIS — K5900 Constipation, unspecified: Secondary | ICD-10-CM | POA: Diagnosis not present

## 2022-06-12 DIAGNOSIS — I4891 Unspecified atrial fibrillation: Secondary | ICD-10-CM | POA: Diagnosis not present

## 2022-06-12 DIAGNOSIS — M1990 Unspecified osteoarthritis, unspecified site: Secondary | ICD-10-CM | POA: Diagnosis not present

## 2022-06-18 ENCOUNTER — Other Ambulatory Visit (HOSPITAL_COMMUNITY): Payer: Self-pay

## 2022-06-18 NOTE — Progress Notes (Signed)
Paramedicine Encounter    Patient ID: Danielle [name], female    DOB: [birth year], 77 y.o.   MRN: [id number]   Complaints-Joint pain.  Assessment- Ambulatory, A&Ox4, warm and dry, mild lower leg swelling, no shortness of breath, no dizziness, no chest pain.   Compliance with meds- missed one evening dose in the last week.   Pill box filled- for one week.   Refills needed-  -Celebrex -Gabapentin -Losartan -Torsemide   Meds changes since last visit- NONE     Social changes- NONE    BP 104/64   Pulse 74   Resp 18   Wt 208 lb (94.3 kg)   SpO2 97%   BMI 35.70 kg/m  Weight yesterday-207lbs Last visit weight-201lbs Weight today- 208lbs   Arrived for home visit for [facility] where she was ambulatory, A&Ox4, warm and dry reporting to be feeling okay other than some joint aches and pains.   She states she has not yet picked up her Celebrex at pharmacy- I stressed to her the importance of picking this up as this will help with her pain. She agreed to do so.   She noted her weight is up today and she says she isn't sure how. She explained that she has bene constipated for a few days and has not had a solid bowel movement in 2 days and reports using metamucil and eating yogurt daily. I encouraged her to be sure she is hydrating well. She states she is only drinking about 24 ounces of water daily. I explained the balance of hydration along with her disease process. She verbalized understanding.   I obtained vitals as noted.  I reviewed meds and confirmed same filling pill box for one week.   We reviewed medications, med compliance, diet, GI health, appointments and diabetic health.   Home visit complete. I will return back on Thursday to assist with Dexcom and recheck weight. She agreed with same.   [name], [place]  ACTION: Home visit completed    Patient Care Team: Seward Carol, MD as PCP - General (Internal Medicine) Constance Haw, MD as  PCP - Electrophysiology (Cardiology) Sueanne Margarita, MD as PCP - Sleep Medicine (Cardiology) Bensimhon, Shaune Pascal, MD as PCP - Cardiology (Cardiology) Jorge Ny, LCSW as Social Worker (Licensed Clinical Social Worker) Shamleffer, Melanie Crazier, MD as Consulting Physician (Endocrinology)  Patient Active Problem List   Diagnosis Date Noted   Chronic systolic heart failure ([place]) 05/08/2022   Type 2 diabetes mellitus with stage 3a chronic kidney disease, with long-term current use of insulin ([place]) 02/16/2022   Prosthetic joint implant failure, initial encounter ([place]) 12/20/2021   Pseudophakia of both eyes 11/15/2021   DVT (deep venous thrombosis) ([place]) 07/31/2021   Acute blood loss anemia    Septic Arthritis, Infection of prosthetic right knee joint ([place]), MSSA infection 56/31/4970   Complication of internal right knee prosthesis ([place]) 07/10/2021   CAD (coronary artery disease) 06/08/2021   Carotid artery disease ([place]) 06/08/2021   Symptomatic bradycardia 06/08/2021   Paroxysmal atrial fibrillation ([place]) 06/08/2021   CKD (chronic kidney disease), stage III ([place]) 06/08/2021   Polyneuropathy associated with underlying disease ([place]) 05/04/2020   Severe nonproliferative diabetic retinopathy of left eye, with macular edema, associated with type 2 diabetes mellitus ([place]) 11/05/2019   Severe nonproliferative diabetic retinopathy of right eye, with macular edema, associated with type 2 diabetes mellitus ([place]) 11/05/2019   Retinal hemorrhage of right eye 11/05/2019   Retinal hemorrhage of left eye 11/05/2019  Diabetes mellitus ([place]) 05/14/2019   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, with long-term current use of insulin ([place]) 05/14/2019   Type 2 diabetes mellitus with diabetic polyneuropathy, with long-term current use of insulin ([place]) 05/14/2019   History of gout 05/20/2018   Primary localized osteoarthritis of right knee 05/13/2018   HFmrEF (heart failure with mildly  reduced EF) 12/20/2017   Obesity (BMI 30-39.9) 11/11/2017   OSA (obstructive sleep apnea) 02/18/2017   Essential hypertension    L Breast Cancer    Hypothyroidism     Current Outpatient Medications:    acetaminophen (TYLENOL) 500 MG tablet, Take 500-1,000 mg by mouth every 6 (six) hours as needed (for pain or headaches)., Disp: , Rfl:    allopurinol (ZYLOPRIM) 100 MG tablet, Take 100 mg by mouth daily., Disp: , Rfl:    apixaban (ELIQUIS) 5 MG TABS tablet, Take 1 tablet (5 mg total) by mouth 2 (two) times daily., Disp: 60 tablet, Rfl: 11   atorvastatin (LIPITOR) 80 MG tablet, Take 1 tablet (80 mg total) by mouth at bedtime., Disp: 90 tablet, Rfl: 3   Biotin 10 MG TABS, Take 10 mg by mouth daily., Disp: , Rfl:    Blood Glucose Monitoring Suppl (ONETOUCH VERIO) w/Device KIT, Use to check blood sugar, Disp: 1 kit, Rfl: 0   carvedilol (COREG) 3.125 MG tablet, Take 1 tablet (3.125 mg total) by mouth 2 (two) times daily with a meal., Disp: 60 tablet, Rfl: 6   celecoxib (CELEBREX) 100 MG capsule, Take 100 mg by mouth 2 (two) times daily., Disp: , Rfl:    clotrimazole-betamethasone (LOTRISONE) lotion, Apply 1 application topically 2 (two) times daily as needed (itching)., Disp: , Rfl:    Continuous Blood Gluc Sensor (DEXCOM G6 SENSOR) MISC, 1 Device by Does not apply route as directed., Disp: 9 each, Rfl: 3   empagliflozin (JARDIANCE) 10 MG TABS tablet, Take 1 tablet (10 mg total) by mouth daily., Disp: 30 tablet, Rfl: 11   Ferrous Sulfate (IRON) 325 (65 Fe) MG TABS, Take 1 tablet (325 mg total) by mouth 2 (two) times daily before a meal., Disp: 180 tablet, Rfl: 3   folic acid (FOLVITE) 1 MG tablet, Take 1 tablet (1 mg total) by mouth daily., Disp: 30 tablet, Rfl: 2   gabapentin (NEURONTIN) 300 MG capsule, Take 300 mg by mouth 2 (two) times daily., Disp: , Rfl:    Insulin Glargine (BASAGLAR KWIKPEN) 100 UNIT/ML, Inject 36 Units into the skin at bedtime., Disp: 45 mL, Rfl: 0   insulin lispro (HUMALOG)  100 UNIT/ML injection, Inject 0.05 mLs (5 Units total) into the skin 3 (three) times daily before meals., Disp: 10 mL, Rfl: 0   Lancets (ONETOUCH DELICA PLUS [id number]) MISC, USE   TO CHECK GLUCOSE 4 TIMES DAILY, Disp: 100 each, Rfl: 3   latanoprost (XALATAN) 0.005 % ophthalmic solution, Place 1 drop into both eyes at bedtime., Disp: , Rfl:    levothyroxine (SYNTHROID, LEVOTHROID) 75 MCG tablet, Take 75 mcg by mouth daily before breakfast., Disp: , Rfl:    losartan (COZAAR) 25 MG tablet, Take 1 tablet (25 mg total) by mouth daily., Disp: 30 tablet, Rfl: 6   Multiple Vitamins-Minerals (ALIVE ONCE DAILY WOMENS 50+ PO), Take 1 tablet by mouth daily., Disp: , Rfl:    spironolactone (ALDACTONE) 25 MG tablet, Take 0.5 tablets (12.5 mg total) by mouth daily., Disp: 45 tablet, Rfl: 3   torsemide (DEMADEX) 20 MG tablet, Take 1 tablet (20 mg total) by mouth as  needed. (Patient taking differently: Take 20 mg by mouth daily as needed (edema).), Disp: 30 tablet, Rfl: 11   LUMIGAN 0.01 % SOLN, Place 1 drop into both eyes at bedtime. (Patient not taking: Reported on 06/04/2022), Disp: , Rfl:    Travoprost, BAK Free, (TRAVATAN) 0.004 % SOLN ophthalmic solution, Place 1 drop into both eyes at bedtime. (Patient not taking: Reported on 06/04/2022), Disp: , Rfl:  Allergies  Allergen Reactions   Codeine Other (See Comments)   Other Other (See Comments)    Blood Product Refusal (Jehovah's witness)     Tape Other (See Comments)    Leaves blisters and marks on the skin   Sulfa Antibiotics Rash   Uloric [Febuxostat] Rash     Social History   Socioeconomic History   Marital status: Widowed    Spouse name: Not on file   Number of children: 3   Years of education: Not on file   Highest education level: Some college, no degree  Occupational History   Occupation: retired  Tobacco Use   Smoking status: Former    Packs/day: 1.00    Years: 22.00    Total pack years: 22.00    Types: Cigarettes    Quit date: 1983     Years since quitting: 41.0   Smokeless tobacco: Never  Vaping Use   Vaping Use: Never used  Substance and Sexual Activity   Alcohol use: Yes    Comment: 05/22/2018 "glass of wine couple times/wk"   Drug use: Not Currently   Sexual activity: Not Currently    Birth control/protection: None, Post-menopausal  Other Topics Concern   Not on file  Social History Narrative   Not on file   Social Determinants of Health   Financial Resource Strain: Medium Risk (12/22/2021)   Overall Financial Resource Strain (CARDIA)    Difficulty of Paying Living Expenses: Somewhat hard  Food Insecurity: No Food Insecurity (05/08/2018)   Hunger Vital Sign    Worried About Running Out of Food in the Last Year: Never true    Ran Out of Food in the Last Year: Never true  Transportation Needs: Unmet Transportation Needs (05/08/2022)   PRAPARE - Hydrologist (Medical): Yes    Lack of Transportation (Non-Medical): Yes  Physical Activity: Not on file  Stress: Not on file  Social Connections: Not on file  Intimate Partner Violence: Not on file    Physical Exam      Future Appointments  Date Time Provider [place]  07/16/2022  4:30 PM [facility] MM 2 [facility]  08/16/2022 10:30 AM Shamleffer, Melanie Crazier, MD [facility] None

## 2022-06-21 ENCOUNTER — Other Ambulatory Visit (HOSPITAL_COMMUNITY): Payer: Self-pay

## 2022-06-21 NOTE — Progress Notes (Signed)
Paramedicine Encounter    Patient ID: Danielle [name], female    DOB: 02-03-1946, 77 y.o.   MRN: [id number]  Met with [name] in the home today to assist with Dexcom device and refilling pill box for remainder of week after she obtained her refills.   I plan to see her in one week from today. She agreed with plans.   [name], EMT-Paramedic [phone] 06/21/2022   ACTION: Home visit completed

## 2022-06-25 ENCOUNTER — Ambulatory Visit (INDEPENDENT_AMBULATORY_CARE_PROVIDER_SITE_OTHER): Payer: Medicare HMO

## 2022-06-25 ENCOUNTER — Ambulatory Visit: Payer: Medicare HMO | Admitting: Podiatry

## 2022-06-25 DIAGNOSIS — M79672 Pain in left foot: Secondary | ICD-10-CM

## 2022-06-25 DIAGNOSIS — E1151 Type 2 diabetes mellitus with diabetic peripheral angiopathy without gangrene: Secondary | ICD-10-CM | POA: Diagnosis not present

## 2022-06-25 DIAGNOSIS — L97522 Non-pressure chronic ulcer of other part of left foot with fat layer exposed: Secondary | ICD-10-CM | POA: Diagnosis not present

## 2022-06-25 MED ORDER — DOXYCYCLINE MONOHYDRATE 100 MG PO CAPS: 100.0000 mg | ORAL_CAPSULE | Freq: Two times a day (BID) | ORAL | 0 refills | Status: AC

## 2022-06-25 NOTE — Progress Notes (Signed)
Subjective:  Patient ID: Danielle [name], female    DOB: April 15, 1946,   MRN: [id number]  Chief Complaint  Patient presents with   Diabetic Ulcer    Diabetic ulcer left 2nd toe, started 3 week ago, patient denies any pain , X-rays done today, A1c- 6.0 BG- 190    77 y.o. female presents for concern of left second toe wound that started about three weeks ago. She is not sure how it happened. She denies pain. Has been soaking. Relates it did get better for a period of time but then worsened again.  . Denies any other pedal complaints. Denies n/v/f/c.   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[place]) 02/05/2017   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 05/20/2018   Acute kidney failure ([place]) 05/15/2018   Arthritis    "all over" (05/22/2018)   Blood dyscrasia    per pt-has small blood cells-appears as if anemic   Breast cancer, left breast ([place]) 1985   CHF (congestive heart failure) (H[facility])    Complication of anesthesia    difficult to awaken from per pt   Coronary artery disease    Dyspnea    Essential hypertension    Heart disease    Heart murmur    History of gout    Hypothyroidism    Obesity (BMI 30-39.9) 11/11/2017   OSA (obstructive sleep apnea) 02/18/2017    severe obstructive sleep apnea with an AHI of 75.6/h and mild central sleep apnea with a CAI of 7.7/h.  Oxygen saturations dropped as low as 82%.   He is on CPAP at 9 cm H2O.   OSA on CPAP    Personal history of chemotherapy    Personal history of radiation therapy    Presence of permanent cardiac pacemaker    Primary localized osteoarthritis of right knee 05/13/2018   Refusal of blood transfusions as patient is Jehovah's Witness    Type II diabetes mellitus ([place])     Objective:  Physical Exam: Vascular: DP/PT pulses 2/4 bilateral. CFT <3 seconds. Absent hair growth on digits. Edema noted to bilateral lower extremities. Xerosis noted bilaterally.   Skin. No lacerations or abrasions bilateral feet. Nails 1-5 bilateral  are thickened discolored and  with subungual debris. Left second digit ulceration about 1 cm x 0.5 cm x 0.2 cm with granular base. Some edema and erythema noted to the digit. No purulence or probe to bone noted.  Musculoskeletal: MMT 5/5 bilateral lower extremities in DF, PF, Inversion and Eversion. Deceased ROM in DF of ankle joint.  Neurological: Sensation intact to light touch. Protective sensation diminished bilateral.    Assessment:   1. Skin ulcer of second toe of left foot with fat layer exposed ([place])   2. Type II diabetes mellitus with peripheral circulatory disorder [facility])      Plan:  Patient was evaluated and treated and all questions answered. Ulcer left second digit with fat layer exposed  -Debridement as below. -Dressed with betadine, DSD. -Off-loading with surgical shoe. -Doxycycline sent to pharmacy.  ABIs ordered .  -Discussed glucose control and proper protein-rich diet.  -Discussed if any worsening redness, pain, fever or chills to call or may need to report to the emergency room. Patient expressed understanding.   Procedure: Excisional Debridement of Wound Rationale: Removal of non-viable soft tissue from the wound to promote healing.  Anesthesia: none Pre-Debridement Wound Measurements: Overlying callus  Post-Debridement Wound Measurements: 1 cm x  0.5 cm x 0.2 cm  Type of Debridement: Sharp Excisional Tissue Removed: Non-viable soft tissue Depth of Debridement: subcutaneous tissue. Technique: Sharp excisional debridement to bleeding, viable wound base.  Dressing: Dry, sterile, compression dressing. Disposition: Patient tolerated procedure well. Patient to return in 2 week for follow-up.  No follow-ups on file.   [name], DPM

## 2022-06-26 ENCOUNTER — Other Ambulatory Visit: Payer: Self-pay | Admitting: Podiatry

## 2022-06-26 DIAGNOSIS — L97522 Non-pressure chronic ulcer of other part of left foot with fat layer exposed: Secondary | ICD-10-CM

## 2022-06-26 DIAGNOSIS — M79672 Pain in left foot: Secondary | ICD-10-CM

## 2022-06-26 DIAGNOSIS — E1151 Type 2 diabetes mellitus with diabetic peripheral angiopathy without gangrene: Secondary | ICD-10-CM

## 2022-06-28 ENCOUNTER — Other Ambulatory Visit (HOSPITAL_COMMUNITY): Payer: Self-pay

## 2022-06-28 NOTE — Progress Notes (Signed)
Paramedicine Encounter    Patient ID: Danielle [name], female    DOB: 1945/09/20, 77 y.o.   MRN: [id number]   Complaints- left foot pain  Assessment- alert and oriented, warm and dry, no shortness of breath, no chest pain, no dizziness, no   Compliance with meds- no missed doses over last week   Pill box filled for one week   Refills needed- Jardiance, Multivitamin  Meds changes since last visit- added doxycycline  '100mg'$  for 10 days BID for infected toe on left foot.     Social changes- none    BP 112/64   Pulse 74   Resp 16   Wt 201 lb (91.2 kg)   SpO2 96%   BMI 34.50 kg/m  Weight yesterday-201lbs Last visit weight-208lbs Weight today- 201lbs   Met [name] in the home today for paramedicine visit. She reports to be feeling okay but having some localized pain to her left foot where she has an infected toe. She was seen by [facility] yesterday and placed in a shoe boot, given topical cream and doxycycline for 10 days BID.   I obtained vitals as noted.  CBG- 182 (after lunch)  Minimal lower leg edema.  Lungs clear.  Weight loss of 7lbs since last week.  She says she has been improving on her diet.   I reviewed meds and confirmed same filling pill box for one week. Refills as noted.  I reviewed upcoming appointments. Wrote down reminders.   I also replaced Dexcom sensor and ordered new ones.   Education provided on diet and med compliance.   Home visit complete.   Danielle [name], EMT-Paramedic [phone] 06/28/2022   Danielle [name], [place]  ACTION: Home visit completed    Patient Care Team: Danielle Carol, MD as PCP - General (Internal Medicine) Danielle Haw, MD as PCP - Electrophysiology (Cardiology) Danielle Margarita, MD as PCP - Sleep Medicine (Cardiology) [name], Danielle Pascal, MD as PCP - Cardiology (Cardiology) Danielle Ny, LCSW as Social Worker (Licensed Clinical Social Worker) [name], Danielle Crazier, MD as Consulting  Physician (Endocrinology)  Patient Active Problem List   Diagnosis Date Noted   Chronic systolic heart failure ([place]) 05/08/2022   Type 2 diabetes mellitus with stage 3a chronic kidney disease, with long-term current use of insulin ([place]) 02/16/2022   Prosthetic joint implant failure, initial encounter ([place]) 12/20/2021   Pseudophakia of both eyes 11/15/2021   DVT (deep venous thrombosis) ([place]) 07/31/2021   Acute blood loss anemia    Septic Arthritis, Infection of prosthetic right knee joint ([place]), MSSA infection 62/83/1517   Complication of internal right knee prosthesis ([place]) 07/10/2021   CAD (coronary artery disease) 06/08/2021   Carotid artery disease ([place]) 06/08/2021   Symptomatic bradycardia 06/08/2021   Paroxysmal atrial fibrillation ([place]) 06/08/2021   CKD (chronic kidney disease), stage III ([place]) 06/08/2021   Polyneuropathy associated with underlying disease ([place]) 05/04/2020   Severe nonproliferative diabetic retinopathy of left eye, with macular edema, associated with type 2 diabetes mellitus ([place]) 11/05/2019   Severe nonproliferative diabetic retinopathy of right eye, with macular edema, associated with type 2 diabetes mellitus ([place]) 11/05/2019   Retinal hemorrhage of right eye 11/05/2019   Retinal hemorrhage of left eye 11/05/2019   Diabetes mellitus ([place]) 05/14/2019   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, with long-term current use of insulin ([place]) 05/14/2019   Type 2 diabetes mellitus with diabetic polyneuropathy, with long-term current use of insulin ([place]) 05/14/2019   History of gout 05/20/2018  Primary localized osteoarthritis of right knee 05/13/2018   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) 12/20/2017   Obesity (BMI 30-39.9) 11/11/2017   OSA (obstructive sleep apnea) 02/18/2017   Essential hypertension    L Breast Cancer    Hypothyroidism     Current Outpatient Medications:    acetaminophen (TYLENOL) 500 MG tablet, Take 500-1,000 mg by  mouth every 6 (six) hours as needed (for pain or headaches)., Disp: , Rfl:    allopurinol (ZYLOPRIM) 100 MG tablet, Take 100 mg by mouth daily., Disp: , Rfl:    apixaban (ELIQUIS) 5 MG TABS tablet, Take 1 tablet (5 mg total) by mouth 2 (two) times daily., Disp: 60 tablet, Rfl: 11   atorvastatin (LIPITOR) 80 MG tablet, Take 1 tablet (80 mg total) by mouth at bedtime., Disp: 90 tablet, Rfl: 3   Biotin 10 MG TABS, Take 10 mg by mouth daily., Disp: , Rfl:    Blood Glucose Monitoring Suppl (ONETOUCH VERIO) w/Device KIT, Use to check blood sugar, Disp: 1 kit, Rfl: 0   carvedilol (COREG) 3.125 MG tablet, Take 1 tablet (3.125 mg total) by mouth 2 (two) times daily with a meal., Disp: 60 tablet, Rfl: 6   celecoxib (CELEBREX) 100 MG capsule, Take 100 mg by mouth 2 (two) times daily., Disp: , Rfl:    clotrimazole-betamethasone (LOTRISONE) lotion, Apply 1 application topically 2 (two) times daily as needed (itching)., Disp: , Rfl:    Continuous Blood Gluc Sensor (DEXCOM G6 SENSOR) MISC, 1 Device by Does not apply route as directed., Disp: 9 each, Rfl: 3   doxycycline (MONODOX) 100 MG capsule, Take 1 capsule (100 mg total) by mouth 2 (two) times daily for 10 days., Disp: 20 capsule, Rfl: 0   empagliflozin (JARDIANCE) 10 MG TABS tablet, Take 1 tablet (10 mg total) by mouth daily., Disp: 30 tablet, Rfl: 11   Ferrous Sulfate (IRON) 325 (65 Fe) MG TABS, Take 1 tablet (325 mg total) by mouth 2 (two) times daily before a meal., Disp: 180 tablet, Rfl: 3   folic acid (FOLVITE) 1 MG tablet, Take 1 tablet (1 mg total) by mouth daily., Disp: 30 tablet, Rfl: 2   gabapentin (NEURONTIN) 300 MG capsule, Take 300 mg by mouth 2 (two) times daily., Disp: , Rfl:    Insulin Glargine (BASAGLAR KWIKPEN) 100 UNIT/ML, Inject 36 Units into the skin at bedtime., Disp: 45 mL, Rfl: 0   insulin lispro (HUMALOG) 100 UNIT/ML injection, Inject 0.05 mLs (5 Units total) into the skin 3 (three) times daily before meals., Disp: 10 mL, Rfl: 0    Lancets (ONETOUCH DELICA PLUS [id number]) MISC, USE   TO CHECK GLUCOSE 4 TIMES DAILY, Disp: 100 each, Rfl: 3   latanoprost (XALATAN) 0.005 % ophthalmic solution, Place 1 drop into both eyes at bedtime., Disp: , Rfl:    levothyroxine (SYNTHROID, LEVOTHROID) 75 MCG tablet, Take 75 mcg by mouth daily before breakfast., Disp: , Rfl:    losartan (COZAAR) 25 MG tablet, Take 1 tablet (25 mg total) by mouth daily., Disp: 30 tablet, Rfl: 6   Multiple Vitamins-Minerals (ALIVE ONCE DAILY WOMENS 50+ PO), Take 1 tablet by mouth daily., Disp: , Rfl:    spironolactone (ALDACTONE) 25 MG tablet, Take 0.5 tablets (12.5 mg total) by mouth daily., Disp: 45 tablet, Rfl: 3   torsemide (DEMADEX) 20 MG tablet, Take 1 tablet (20 mg total) by mouth as needed. (Patient taking differently: Take 20 mg by mouth daily as needed (edema).), Disp: 30 tablet, Rfl: 11  LUMIGAN 0.01 % SOLN, Place 1 drop into both eyes at bedtime. (Patient not taking: Reported on 06/04/2022), Disp: , Rfl:    Travoprost, BAK Free, (TRAVATAN) 0.004 % SOLN ophthalmic solution, Place 1 drop into both eyes at bedtime. (Patient not taking: Reported on 06/04/2022), Disp: , Rfl:  Allergies  Allergen Reactions   Codeine Other (See Comments)   Other Other (See Comments)    Blood Product Refusal (Jehovah's witness)     Tape Other (See Comments)    Leaves blisters and marks on the skin   Sulfa Antibiotics Rash   Uloric [Febuxostat] Rash     Social History   Socioeconomic History   Marital status: Widowed    Spouse name: Not on file   Number of children: 3   Years of education: Not on file   Highest education level: Some college, no degree  Occupational History   Occupation: retired  Tobacco Use   Smoking status: Former    Packs/day: 1.00    Years: 22.00    Total pack years: 22.00    Types: Cigarettes    Quit date: 45    Years since quitting: 41.1   Smokeless tobacco: Never  Vaping Use   Vaping Use: Never used  Substance and Sexual Activity    Alcohol use: Yes    Comment: 05/22/2018 "glass of wine couple times/wk"   Drug use: Not Currently   Sexual activity: Not Currently    Birth control/protection: None, Post-menopausal  Other Topics Concern   Not on file  Social History Narrative   Not on file   Social Determinants of Health   Financial Resource Strain: Medium Risk (12/22/2021)   Overall Financial Resource Strain (CARDIA)    Difficulty of Paying Living Expenses: Somewhat hard  Food Insecurity: No Food Insecurity (05/08/2018)   Hunger Vital Sign    Worried About Running Out of Food in the Last Year: Never true    [place] in the Last Year: Never true  Transportation Needs: Unmet Transportation Needs (05/08/2022)   PRAPARE - Hydrologist (Medical): Yes    Lack of Transportation (Non-Medical): Yes  Physical Activity: Not on file  Stress: Not on file  Social Connections: Not on file  Intimate Partner Violence: Not on file    Physical Exam      Future Appointments  Date Time Provider [place]  07/05/2022 10:30 AM [facility] VASC 5 [facility] VVS  07/09/2022 10:45 AM [name], DPM [facility]  07/16/2022  4:30 PM [facility] MM 2 [facility]  08/16/2022 10:30 AM [name], Danielle Crazier, MD [facility] None

## 2022-07-05 ENCOUNTER — Ambulatory Visit (HOSPITAL_COMMUNITY): Payer: Medicare HMO

## 2022-07-05 ENCOUNTER — Other Ambulatory Visit (HOSPITAL_COMMUNITY): Payer: Self-pay

## 2022-07-05 NOTE — Progress Notes (Signed)
Paramedicine Encounter    Patient ID: Danielle [name], female    DOB: September 19, 1945, 77 y.o.   MRN: [id number]   Complaints- left middle toe infection taking antibiotics and following up with [facility] for same.   Assessment- alert and oriented, seated, no shortness of breath, no chest pain, no dizziness. Some lower leg edema noted, she is not wearing her compression socks as of now. Lungs clear.   Compliance with meds- missed one dose of medications in the last week.   Pill box filled for one week.   Refills needed-carvedilol   Meds changes since last visit- NONE     Social changes- NONE   Arrived for home visit for [facility] who reports to be feeling okay, she reports she has been having increased fatigue with low energy but otherwise feeling okay. I obtained vitals and assessment.   I refilled pill box for one week. Refills called into [facility]  -Carvedilol   I rescheduled her V&V appointment she missed today due to forgetting. I wrote down upcoming appointments and reminded her of same.   I replaced dexcom sensor.   I reminded her of heart healthy diet as well as sodium and fluid restrictions and med compliance.   Home visit complete.    BP 120/68   Pulse 72   Resp 16   Wt 206 lb (93.4 kg)   SpO2 96%   BMI 35.36 kg/m  Weight yesterday-didn't weigh Last visit weight-201lbs  Up 5 lbs in a week- missed one dose of meds.   [name], [place]  ACTION: Home visit completed    Patient Care Team: Seward Carol, MD as PCP - General (Internal Medicine) Constance Haw, MD as PCP - Electrophysiology (Cardiology) Sueanne Margarita, MD as PCP - Sleep Medicine (Cardiology) Bensimhon, Shaune Pascal, MD as PCP - Cardiology (Cardiology) Jorge Ny, LCSW as Social Worker (Licensed Clinical Social Worker) Shamleffer, Melanie Crazier, MD as Consulting Physician (Endocrinology)  Patient Active Problem List   Diagnosis Date Noted   Chronic systolic  heart failure ([place]) 05/08/2022   Type 2 diabetes mellitus with stage 3a chronic kidney disease, with long-term current use of insulin ([place]) 02/16/2022   Prosthetic joint implant failure, initial encounter ([place]) 12/20/2021   Pseudophakia of both eyes 11/15/2021   DVT (deep venous thrombosis) ([place]) 07/31/2021   Acute blood loss anemia    Septic Arthritis, Infection of prosthetic right knee joint ([place]), MSSA infection 54/65/6812   Complication of internal right knee prosthesis ([place]) 07/10/2021   CAD (coronary artery disease) 06/08/2021   Carotid artery disease ([place]) 06/08/2021   Symptomatic bradycardia 06/08/2021   Paroxysmal atrial fibrillation ([place]) 06/08/2021   CKD (chronic kidney disease), stage III ([place]) 06/08/2021   Polyneuropathy associated with underlying disease ([place]) 05/04/2020   Severe nonproliferative diabetic retinopathy of left eye, with macular edema, associated with type 2 diabetes mellitus ([place]) 11/05/2019   Severe nonproliferative diabetic retinopathy of right eye, with macular edema, associated with type 2 diabetes mellitus ([place]) 11/05/2019   Retinal hemorrhage of right eye 11/05/2019   Retinal hemorrhage of left eye 11/05/2019   Diabetes mellitus ([place]) 05/14/2019   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, with long-term current use of insulin ([place]) 05/14/2019   Type 2 diabetes mellitus with diabetic polyneuropathy, with long-term current use of insulin ([place]) 05/14/2019   History of gout 05/20/2018   Primary localized osteoarthritis of right knee 05/13/2018   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) 12/20/2017   Obesity (BMI  30-39.9) 11/11/2017   OSA (obstructive sleep apnea) 02/18/2017   Essential hypertension    L Breast Cancer    Hypothyroidism     Current Outpatient Medications:    acetaminophen (TYLENOL) 500 MG tablet, Take 500-1,000 mg by mouth every 6 (six) hours as needed (for pain or headaches)., Disp: , Rfl:    allopurinol (ZYLOPRIM) 100  MG tablet, Take 100 mg by mouth daily., Disp: , Rfl:    apixaban (ELIQUIS) 5 MG TABS tablet, Take 1 tablet (5 mg total) by mouth 2 (two) times daily., Disp: 60 tablet, Rfl: 11   atorvastatin (LIPITOR) 80 MG tablet, Take 1 tablet (80 mg total) by mouth at bedtime., Disp: 90 tablet, Rfl: 3   Biotin 10 MG TABS, Take 10 mg by mouth daily., Disp: , Rfl:    Blood Glucose Monitoring Suppl (ONETOUCH VERIO) w/Device KIT, Use to check blood sugar, Disp: 1 kit, Rfl: 0   carvedilol (COREG) 3.125 MG tablet, Take 1 tablet (3.125 mg total) by mouth 2 (two) times daily with a meal., Disp: 60 tablet, Rfl: 6   celecoxib (CELEBREX) 100 MG capsule, Take 100 mg by mouth 2 (two) times daily., Disp: , Rfl:    clotrimazole-betamethasone (LOTRISONE) lotion, Apply 1 application topically 2 (two) times daily as needed (itching)., Disp: , Rfl:    Continuous Blood Gluc Sensor (DEXCOM G6 SENSOR) MISC, 1 Device by Does not apply route as directed., Disp: 9 each, Rfl: 3   doxycycline (MONODOX) 100 MG capsule, Take 1 capsule (100 mg total) by mouth 2 (two) times daily for 10 days., Disp: 20 capsule, Rfl: 0   empagliflozin (JARDIANCE) 10 MG TABS tablet, Take 1 tablet (10 mg total) by mouth daily., Disp: 30 tablet, Rfl: 11   Ferrous Sulfate (IRON) 325 (65 Fe) MG TABS, Take 1 tablet (325 mg total) by mouth 2 (two) times daily before a meal., Disp: 180 tablet, Rfl: 3   folic acid (FOLVITE) 1 MG tablet, Take 1 tablet (1 mg total) by mouth daily., Disp: 30 tablet, Rfl: 2   gabapentin (NEURONTIN) 300 MG capsule, Take 300 mg by mouth 2 (two) times daily., Disp: , Rfl:    Insulin Glargine (BASAGLAR KWIKPEN) 100 UNIT/ML, Inject 36 Units into the skin at bedtime., Disp: 45 mL, Rfl: 0   insulin lispro (HUMALOG) 100 UNIT/ML injection, Inject 0.05 mLs (5 Units total) into the skin 3 (three) times daily before meals., Disp: 10 mL, Rfl: 0   Lancets (ONETOUCH DELICA PLUS [id number]) MISC, USE   TO CHECK GLUCOSE 4 TIMES DAILY, Disp: 100 each, Rfl: 3    latanoprost (XALATAN) 0.005 % ophthalmic solution, Place 1 drop into both eyes at bedtime., Disp: , Rfl:    levothyroxine (SYNTHROID, LEVOTHROID) 75 MCG tablet, Take 75 mcg by mouth daily before breakfast., Disp: , Rfl:    losartan (COZAAR) 25 MG tablet, Take 1 tablet (25 mg total) by mouth daily., Disp: 30 tablet, Rfl: 6   Multiple Vitamins-Minerals (ALIVE ONCE DAILY WOMENS 50+ PO), Take 1 tablet by mouth daily., Disp: , Rfl:    spironolactone (ALDACTONE) 25 MG tablet, Take 0.5 tablets (12.5 mg total) by mouth daily., Disp: 45 tablet, Rfl: 3   torsemide (DEMADEX) 20 MG tablet, Take 1 tablet (20 mg total) by mouth as needed. (Patient taking differently: Take 20 mg by mouth daily as needed (edema).), Disp: 30 tablet, Rfl: 11   LUMIGAN 0.01 % SOLN, Place 1 drop into both eyes at bedtime. (Patient not taking: Reported on 06/04/2022), Disp: ,  Rfl:    Travoprost, BAK Free, (TRAVATAN) 0.004 % SOLN ophthalmic solution, Place 1 drop into both eyes at bedtime. (Patient not taking: Reported on 06/04/2022), Disp: , Rfl:  Allergies  Allergen Reactions   Codeine Other (See Comments)   Other Other (See Comments)    Blood Product Refusal (Jehovah's witness)     Tape Other (See Comments)    Leaves blisters and marks on the skin   Sulfa Antibiotics Rash   Uloric [Febuxostat] Rash     Social History   Socioeconomic History   Marital status: Widowed    Spouse name: Not on file   Number of children: 3   Years of education: Not on file   Highest education level: Some college, no degree  Occupational History   Occupation: retired  Tobacco Use   Smoking status: Former    Packs/day: 1.00    Years: 22.00    Total pack years: 22.00    Types: Cigarettes    Quit date: 68    Years since quitting: 41.1   Smokeless tobacco: Never  Vaping Use   Vaping Use: Never used  Substance and Sexual Activity   Alcohol use: Yes    Comment: 05/22/2018 "glass of wine couple times/wk"   Drug use: Not Currently    Sexual activity: Not Currently    Birth control/protection: None, Post-menopausal  Other Topics Concern   Not on file  Social History Narrative   Not on file   Social Determinants of Health   Financial Resource Strain: Medium Risk (12/22/2021)   Overall Financial Resource Strain (CARDIA)    Difficulty of Paying Living Expenses: Somewhat hard  Food Insecurity: No Food Insecurity (05/08/2018)   Hunger Vital Sign    Worried About Running Out of Food in the Last Year: Never true    [place] in the Last Year: Never true  Transportation Needs: Unmet Transportation Needs (05/08/2022)   PRAPARE - Hydrologist (Medical): Yes    Lack of Transportation (Non-Medical): Yes  Physical Activity: Not on file  Stress: Not on file  Social Connections: Not on file  Intimate Partner Violence: Not on file    Physical Exam      Future Appointments  Date Time Provider [place]  07/09/2022 10:45 AM [name], DPM [facility]  07/10/2022  4:00 PM [facility] HS VASC 5 [facility]-H[facility]I VVS  07/16/2022  4:30 PM [facility] MM 2 [facility]  08/16/2022 10:30 AM Shamleffer, Melanie Crazier, MD [facility] None

## 2022-07-06 ENCOUNTER — Encounter: Payer: Self-pay | Admitting: Internal Medicine

## 2022-07-09 ENCOUNTER — Ambulatory Visit (INDEPENDENT_AMBULATORY_CARE_PROVIDER_SITE_OTHER): Payer: Medicare HMO

## 2022-07-09 ENCOUNTER — Encounter: Payer: Self-pay | Admitting: Podiatry

## 2022-07-09 ENCOUNTER — Other Ambulatory Visit: Payer: Self-pay | Admitting: Podiatry

## 2022-07-09 ENCOUNTER — Ambulatory Visit: Payer: Medicare HMO | Admitting: Podiatry

## 2022-07-09 DIAGNOSIS — L97524 Non-pressure chronic ulcer of other part of left foot with necrosis of bone: Secondary | ICD-10-CM

## 2022-07-09 DIAGNOSIS — E1151 Type 2 diabetes mellitus with diabetic peripheral angiopathy without gangrene: Secondary | ICD-10-CM

## 2022-07-09 MED ORDER — DOXYCYCLINE HYCLATE 100 MG PO TABS: 100.0000 mg | ORAL_TABLET | Freq: Two times a day (BID) | ORAL | 0 refills | Status: DC

## 2022-07-09 NOTE — Addendum Note (Signed)
Addended by: Zebedee Iba on: 07/09/2022 11:56 AM   Modules accepted: Orders

## 2022-07-09 NOTE — Progress Notes (Signed)
Subjective:  Patient ID: Danielle [name], female    DOB: 25-May-1946,   MRN: [id number]  Chief Complaint  Patient presents with   Follow-up    Patient states that her toe is starting to hurt. She states that she has gout.    77 y.o. female presents for follow-up of left second toe wound. Relates the toe has been starting to hurt. Has been dressing as instructed .Finished course of doxycycline. Has vascular studies schedule for tomorrow.  Denies any other pedal complaints. Denies n/v/f/c.   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[place]) 02/05/2017   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 05/20/2018   Acute kidney failure ([place]) 05/15/2018   Arthritis    "all over" (05/22/2018)   Blood dyscrasia    per pt-has small blood cells-appears as if anemic   Breast cancer, left breast ([place]) 1985   CHF (congestive heart failure) (H[facility])    Complication of anesthesia    difficult to awaken from per pt   Coronary artery disease    Dyspnea    Essential hypertension    Heart disease    Heart murmur    History of gout    Hypothyroidism    Obesity (BMI 30-39.9) 11/11/2017   OSA (obstructive sleep apnea) 02/18/2017    severe obstructive sleep apnea with an AHI of 75.6/h and mild central sleep apnea with a CAI of 7.7/h.  Oxygen saturations dropped as low as 82%.   He is on CPAP at 9 cm H2O.   OSA on CPAP    Personal history of chemotherapy    Personal history of radiation therapy    Presence of permanent cardiac pacemaker    Primary localized osteoarthritis of right knee 05/13/2018   Refusal of blood transfusions as patient is Jehovah's Witness    Type II diabetes mellitus ([place])     Objective:  Physical Exam: Vascular: DP/PT pulses 2/4 bilateral. CFT <3 seconds. Absent hair growth on digits. Edema noted to bilateral lower extremities. Xerosis noted bilaterally.  Skin. No lacerations or abrasions bilateral feet. Nails  1-5 bilateral  are thickened discolored and  with subungual debris. Left second digit ulceration about 1 cm x 0.5 cm x 0.5 cm with granular base. Some edema and erythema noted to the digit. No purulence. Does probe to bone today.  Musculoskeletal: MMT 5/5 bilateral lower extremities in DF, PF, Inversion and Eversion. Deceased ROM in DF of ankle joint.  Neurological: Sensation intact to light touch. Protective sensation diminished bilateral.    Assessment:   1. 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 Type II diabetes mellitus with peripheral circulatory disorder [facility])       Plan:  Patient was evaluated and treated and all questions answered. Ulcer left second digit with necrosis of bone X-rays left foot no significant difference noted from previous. Possible erosions of the distal phalanx.  -Debridement as below. -Dressed with betadine, DSD. -Off-loading with surgical shoe. -Doxycycline sent to pharmacy.  ABIs ordered . Scheduled tomorrow    MRI ordered.  CBC,CRP, ESR BMP ordered.  Wound culture swab taken of the toe.  -Did discuss hospitalization with patient and inpatient care for the infection in her bone. Toe currently appears stable and will plan to try and remain outpatient. With MRI and outpatient surgical treatment. Did discuss if any worsening in the meantime to head to the hospital.  -Discussed  glucose control and proper protein-rich diet.  -Discussed if any worsening redness, pain, fever or chills to call or may need to report to the emergency room. Patient expressed understanding.   Procedure: Excisional Debridement of Wound Rationale: Removal of non-viable soft tissue from the wound to promote healing.  Anesthesia: none Pre-Debridement Wound Measurements: Overlying callus  Post-Debridement Wound Measurements: 1 cm x 0.5 cm x 0.5 cm  Type of Debridement: Sharp Excisional Tissue Removed: Non-viable soft tissue Depth of Debridement:  subcutaneous tissue. Technique: Sharp excisional debridement to bleeding, viable wound base.  Dressing: Dry, sterile, compression dressing. Disposition: Patient tolerated procedure well. Patient to return in 2 week for follow-up.  Return in about 2 weeks (around 07/23/2022) for wound check.   [name], DPM

## 2022-07-10 ENCOUNTER — Ambulatory Visit
Admission: RE | Admit: 2022-07-10 | Discharge: 2022-07-10 | Disposition: A | Discharge status: Home / Self Care | Payer: Medicare HMO | Source: Ambulatory Visit | Attending: Podiatry | Admitting: Podiatry

## 2022-07-10 ENCOUNTER — Ambulatory Visit (HOSPITAL_COMMUNITY)
Admission: RE | Admit: 2022-07-10 | Discharge: 2022-07-10 | Disposition: A | Discharge status: Home / Self Care | Payer: Medicare HMO | Source: Ambulatory Visit | Attending: Podiatry | Admitting: Podiatry

## 2022-07-10 ENCOUNTER — Telehealth: Payer: Self-pay | Admitting: *Deleted

## 2022-07-10 DIAGNOSIS — L97522 Non-pressure chronic ulcer of other part of left foot with fat layer exposed: Secondary | ICD-10-CM | POA: Diagnosis not present

## 2022-07-10 DIAGNOSIS — E1151 Type 2 diabetes mellitus with diabetic peripheral angiopathy without gangrene: Secondary | ICD-10-CM | POA: Insufficient documentation

## 2022-07-10 DIAGNOSIS — L97524 Non-pressure chronic ulcer of other part of left foot with necrosis of bone: Secondary | ICD-10-CM

## 2022-07-10 DIAGNOSIS — E1165 Type 2 diabetes mellitus with hyperglycemia: Secondary | ICD-10-CM | POA: Diagnosis not present

## 2022-07-10 NOTE — Progress Notes (Signed)
Unable to perform MRI at [facility] due to the pacemaker. Pt is advised to contact hospital to check if she can be scanned there

## 2022-07-10 NOTE — Telephone Encounter (Signed)
Thank you :)

## 2022-07-10 NOTE — Telephone Encounter (Signed)
Patient is calling because her MRI was cancelled @[facility] ,has a pacemaker, order has to be changed to hospital instead. I called [facility], will contact patient to schedule at hospital,patient updated and explained to have the pacemaker # when scheduling.

## 2022-07-11 ENCOUNTER — Telehealth: Payer: Self-pay

## 2022-07-11 ENCOUNTER — Other Ambulatory Visit: Payer: Self-pay | Admitting: Podiatry

## 2022-07-11 DIAGNOSIS — L97524 Non-pressure chronic ulcer of other part of left foot with necrosis of bone: Secondary | ICD-10-CM

## 2022-07-11 DIAGNOSIS — E1151 Type 2 diabetes mellitus with diabetic peripheral angiopathy without gangrene: Secondary | ICD-10-CM

## 2022-07-11 LAB — VAS US ABI WITH/WO TBI

## 2022-07-11 NOTE — Telephone Encounter (Signed)
Noted, thanks!

## 2022-07-12 ENCOUNTER — Other Ambulatory Visit (HOSPITAL_COMMUNITY): Payer: Self-pay

## 2022-07-12 NOTE — Progress Notes (Signed)
Paramedicine Encounter    Patient ID: Danielle [name], female    DOB: 1946/03/21, 77 y.o.   MRN: [id number]   Complaints- Foot and toe pain. Being followed by [facility].  Assessment- A&Ox4, warm and dry with no shortness of breath, chest pain, dizziness, increased weight gain. She reports some toe pain where she is being treated for an infection in her toe by [facility]. Some lower leg swelling. Lungs clear.   Compliance with meds- no missed doses   Pill box filled- for one week   Refills needed- called in to [facility]     Meds changes since last visit- Doxycycline for 14 days BID.    Social changes- NONE   Arrived for home visit for [facility] who reports to be feeling well with no complaints other than toe and foot pain from her toe infection being treated with doxycycline by [facility]- she was seen by them earlier this week. She has some swelling in her lower legs today. Meds reviewed and confirmed. Pill box filled for one week. Refills noted and called into [facility]. We are awaiting her Dexcom shipment. She is checking CBG manually. CBG today is 228- she took 5 units of Humalog during our visit. I reviewed DM and HF education. Appointments discussed and details wrote down. Home visit complete. [name].     BP 122/76   Pulse 76   Resp 16   Wt 200 lb (90.7 kg)   SpO2 95%   BMI 34.33 kg/m  Weight yesterday-201lbs Last visit weight-206lbs   Danielle [name], [place]  ACTION: Home visit completed      Patient Care Team: Seward Carol, MD as PCP - General (Internal Medicine) Constance Haw, MD as PCP - Electrophysiology (Cardiology) Sueanne Margarita, MD as PCP - Sleep Medicine (Cardiology) Bensimhon, Shaune Pascal, MD as PCP - Cardiology (Cardiology) Jorge Ny, LCSW as Social Worker (Licensed Clinical Social Worker) Shamleffer, Melanie Crazier, MD as Consulting Physician (Endocrinology)  Patient  Active Problem List   Diagnosis Date Noted   Chronic systolic heart failure ([place]) 05/08/2022   Type 2 diabetes mellitus with stage 3a chronic kidney disease, with long-term current use of insulin ([place]) 02/16/2022   Prosthetic joint implant failure, initial encounter ([place]) 12/20/2021   Pseudophakia of both eyes 11/15/2021   DVT (deep venous thrombosis) ([place]) 07/31/2021   Acute blood loss anemia    Septic Arthritis, Infection of prosthetic right knee joint ([place]), MSSA infection 123XX123   Complication of internal right knee prosthesis ([place]) 07/10/2021   CAD (coronary artery disease) 06/08/2021   Carotid artery disease ([place]) 06/08/2021   Symptomatic bradycardia 06/08/2021   Paroxysmal atrial fibrillation ([place]) 06/08/2021   CKD (chronic kidney disease), stage III ([place]) 06/08/2021   Polyneuropathy associated with underlying disease ([place]) 05/04/2020   Severe nonproliferative diabetic retinopathy of left eye, with macular edema, associated with type 2 diabetes mellitus ([place]) 11/05/2019   Severe nonproliferative diabetic retinopathy of right eye, with macular edema, associated with type 2 diabetes mellitus ([place]) 11/05/2019   Retinal hemorrhage of right eye 11/05/2019   Retinal hemorrhage of left eye 11/05/2019   Diabetes mellitus ([place]) 05/14/2019   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, with long-term current use of insulin ([place]) 05/14/2019   Type 2 diabetes mellitus with diabetic polyneuropathy, with long-term current use of insulin ([place]) 05/14/2019   History of gout 05/20/2018   Primary localized osteoarthritis of right knee 05/13/2018   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) 12/20/2017   Obesity (BMI 30-39.9) 11/11/2017   OSA (obstructive sleep apnea) 02/18/2017   Essential hypertension    L Breast Cancer    Hypothyroidism     Current Outpatient Medications:    acetaminophen (TYLENOL) 500 MG tablet, Take 500-1,000 mg by mouth every 6 (six) hours as needed (for  pain or headaches)., Disp: , Rfl:    allopurinol (ZYLOPRIM) 100 MG tablet, Take 100 mg by mouth daily., Disp: , Rfl:    apixaban (ELIQUIS) 5 MG TABS tablet, Take 1 tablet (5 mg total) by mouth 2 (two) times daily., Disp: 60 tablet, Rfl: 11   atorvastatin (LIPITOR) 80 MG tablet, Take 1 tablet (80 mg total) by mouth at bedtime., Disp: 90 tablet, Rfl: 3   Biotin 10 MG TABS, Take 10 mg by mouth daily., Disp: , Rfl:    Blood Glucose Monitoring Suppl (ONETOUCH VERIO) w/Device KIT, Use to check blood sugar, Disp: 1 kit, Rfl: 0   carvedilol (COREG) 3.125 MG tablet, Take 1 tablet (3.125 mg total) by mouth 2 (two) times daily with a meal., Disp: 60 tablet, Rfl: 6   celecoxib (CELEBREX) 100 MG capsule, Take 100 mg by mouth 2 (two) times daily., Disp: , Rfl:    clotrimazole-betamethasone (LOTRISONE) lotion, Apply 1 application topically 2 (two) times daily as needed (itching)., Disp: , Rfl:    Continuous Blood Gluc Sensor (DEXCOM G6 SENSOR) MISC, 1 Device by Does not apply route as directed., Disp: 9 each, Rfl: 3   doxycycline (VIBRA-TABS) 100 MG tablet, Take 1 tablet (100 mg total) by mouth 2 (two) times daily for 14 days., Disp: 28 tablet, Rfl: 0   empagliflozin (JARDIANCE) 10 MG TABS tablet, Take 1 tablet (10 mg total) by mouth daily., Disp: 30 tablet, Rfl: 11   Ferrous Sulfate (IRON) 325 (65 Fe) MG TABS, Take 1 tablet (325 mg total) by mouth 2 (two) times daily before a meal., Disp: 180 tablet, Rfl: 3   folic acid (FOLVITE) 1 MG tablet, Take 1 tablet (1 mg total) by mouth daily., Disp: 30 tablet, Rfl: 2   gabapentin (NEURONTIN) 300 MG capsule, Take 300 mg by mouth 2 (two) times daily., Disp: , Rfl:    Insulin Glargine (BASAGLAR KWIKPEN) 100 UNIT/ML, Inject 36 Units into the skin at bedtime., Disp: 45 mL, Rfl: 0   insulin lispro (HUMALOG) 100 UNIT/ML injection, Inject 0.05 mLs (5 Units total) into the skin 3 (three) times daily before meals., Disp: 10 mL, Rfl: 0   Lancets (ONETOUCH DELICA PLUS 123XX123)  MISC, USE   TO CHECK GLUCOSE 4 TIMES DAILY, Disp: 100 each, Rfl: 3   latanoprost (XALATAN) 0.005 % ophthalmic solution, Place 1 drop into both eyes at bedtime., Disp: , Rfl:    levothyroxine (SYNTHROID, LEVOTHROID) 75 MCG tablet, Take 75 mcg by mouth daily before breakfast., Disp: , Rfl:    losartan (COZAAR) 25 MG tablet, Take 1 tablet (25 mg total) by mouth daily., Disp: 30 tablet, Rfl: 6   Multiple Vitamins-Minerals (ALIVE ONCE DAILY WOMENS 50+ PO), Take 1 tablet by mouth daily., Disp: , Rfl:    spironolactone (ALDACTONE) 25 MG tablet, Take 0.5 tablets (12.5 mg total) by mouth daily., Disp: 45 tablet, Rfl: 3   torsemide (DEMADEX) 20 MG tablet, Take 1 tablet (20 mg total) by mouth as needed. (Patient taking differently: Take 20 mg by mouth daily as needed (edema).), Disp: 30 tablet, Rfl: 11   LUMIGAN 0.01 % SOLN, Place 1 drop into both eyes at  bedtime. (Patient not taking: Reported on 06/04/2022), Disp: , Rfl:    Travoprost, BAK Free, (TRAVATAN) 0.004 % SOLN ophthalmic solution, Place 1 drop into both eyes at bedtime. (Patient not taking: Reported on 06/04/2022), Disp: , Rfl:  Allergies  Allergen Reactions   Codeine Other (See Comments)   Other Other (See Comments)    Blood Product Refusal (Jehovah's witness)     Tape Other (See Comments)    Leaves blisters and marks on the skin   Sulfa Antibiotics Rash   Uloric [Febuxostat] Rash     Social History   Socioeconomic History   Marital status: Widowed    Spouse name: Not on file   Number of children: 3   Years of education: Not on file   Highest education level: Some college, no degree  Occupational History   Occupation: retired  Tobacco Use   Smoking status: Former    Packs/day: 1.00    Years: 22.00    Total pack years: 22.00    Types: Cigarettes    Quit date: 72    Years since quitting: 41.1   Smokeless tobacco: Never  Vaping Use   Vaping Use: Never used  Substance and Sexual Activity   Alcohol use: Yes    Comment:  05/22/2018 "glass of wine couple times/wk"   Drug use: Not Currently   Sexual activity: Not Currently    Birth control/protection: None, Post-menopausal  Other Topics Concern   Not on file  Social History Narrative   Not on file   Social Determinants of Health   Financial Resource Strain: Medium Risk (12/22/2021)   Overall Financial Resource Strain (CARDIA)    Difficulty of Paying Living Expenses: Somewhat hard  Food Insecurity: No Food Insecurity (05/08/2018)   Hunger Vital Sign    Worried About Running Out of Food in the Last Year: Never true    [place] in the Last Year: Never true  Transportation Needs: Unmet Transportation Needs (05/08/2022)   PRAPARE - Hydrologist (Medical): Yes    Lack of Transportation (Non-Medical): Yes  Physical Activity: Not on file  Stress: Not on file  Social Connections: Not on file  Intimate Partner Violence: Not on file    Physical Exam      Future Appointments  Date Time Provider [place]  07/16/2022  4:30 PM [facility] MM 2 [facility]  07/23/2022 10:45 AM [name], DPM [facility]  08/16/2022 10:30 AM Shamleffer, Melanie Crazier, MD [facility] None  08/28/2022  1:00 PM MC-MR 1 [facility]

## 2022-07-16 ENCOUNTER — Ambulatory Visit
Admission: RE | Admit: 2022-07-16 | Discharge: 2022-07-16 | Disposition: A | Discharge status: Home / Self Care | Payer: Medicare HMO | Source: Ambulatory Visit | Attending: Internal Medicine | Admitting: Internal Medicine

## 2022-07-16 DIAGNOSIS — E1151 Type 2 diabetes mellitus with diabetic peripheral angiopathy without gangrene: Secondary | ICD-10-CM | POA: Diagnosis not present

## 2022-07-16 DIAGNOSIS — Z1231 Encounter for screening mammogram for malignant neoplasm of breast: Secondary | ICD-10-CM

## 2022-07-16 DIAGNOSIS — L97524 Non-pressure chronic ulcer of other part of left foot with necrosis of bone: Secondary | ICD-10-CM | POA: Diagnosis not present

## 2022-07-16 LAB — WOUND CULTURE
MICRO NUMBER:: 14552417
SPECIMEN QUALITY:: ADEQUATE

## 2022-07-16 LAB — HOUSE ACCOUNT TRACKING

## 2022-07-17 LAB — CBC WITH DIFFERENTIAL/PLATELET
Absolute Monocytes: 697 cells/uL (ref 200–950)
Basophils Absolute: 49 cells/uL (ref 0–200)
Basophils Relative: 0.6 %
Eosinophils Absolute: 131 cells/uL (ref 15–500)
Eosinophils Relative: 1.6 %
HCT: 34.3 % — ABNORMAL LOW (ref 35.0–45.0)
Hemoglobin: 10.8 g/dL — ABNORMAL LOW (ref 11.7–15.5)
Lymphs Abs: 3247 cells/uL (ref 850–3900)
MCH: 20.8 pg — ABNORMAL LOW (ref 27.0–33.0)
MCHC: 31.5 g/dL — ABNORMAL LOW (ref 32.0–36.0)
MCV: 66.2 fL — ABNORMAL LOW (ref 80.0–100.0)
Monocytes Relative: 8.5 %
Neutro Abs: 4075 cells/uL (ref 1500–7800)
Neutrophils Relative %: 49.7 %
Platelets: 269 10*3/uL (ref 140–400)
RBC: 5.18 10*6/uL — ABNORMAL HIGH (ref 3.80–5.10)
RDW: 17.1 % — ABNORMAL HIGH (ref 11.0–15.0)
Total Lymphocyte: 39.6 %
WBC: 8.2 10*3/uL (ref 3.8–10.8)

## 2022-07-17 LAB — SEDIMENTATION RATE: Sed Rate: 11 mm/h (ref 0–30)

## 2022-07-17 LAB — BASIC METABOLIC PANEL
BUN/Creatinine Ratio: 25 (calc) — ABNORMAL HIGH (ref 6–22)
BUN: 43 mg/dL — ABNORMAL HIGH (ref 7–25)
CO2: 29 mmol/L (ref 20–32)
Calcium: 10 mg/dL (ref 8.6–10.4)
Chloride: 101 mmol/L (ref 98–110)
Creat: 1.73 mg/dL — ABNORMAL HIGH (ref 0.60–1.00)
Glucose, Bld: 108 mg/dL — ABNORMAL HIGH (ref 65–99)
Potassium: 4.4 mmol/L (ref 3.5–5.3)
Sodium: 141 mmol/L (ref 135–146)

## 2022-07-17 LAB — C-REACTIVE PROTEIN: CRP: 2.5 mg/L (ref <8.0)

## 2022-07-17 LAB — CBC MORPHOLOGY

## 2022-07-18 ENCOUNTER — Ambulatory Visit: Payer: Medicare HMO | Admitting: Vascular Surgery

## 2022-07-18 ENCOUNTER — Encounter: Payer: Self-pay | Admitting: Vascular Surgery

## 2022-07-18 VITALS — BP 92/48 | HR 78 | Temp 97.8°F | Resp 20 | Ht 64.0 in | Wt 212.0 lb

## 2022-07-18 DIAGNOSIS — I70222 Atherosclerosis of native arteries of extremities with rest pain, left leg: Secondary | ICD-10-CM

## 2022-07-18 NOTE — H&P (View-Only) (Signed)
Patient ID: Danielle [name], female   DOB: 04-Oct-1945, 77 y.o.   MRN: [id number]  Reason for Consult: No chief complaint on file.   Referred by [name], DPM  Subjective:     HPI:  Danielle [name] is a 77 y.o. female with history of diabetes, congestive heart failure, coronary artery disease I had previously seen 2 years ago for discoloration of her second toe.  She continues to walk with the help of a walker.  She now has ulceration on the toe.  She has never had vascular invention.  She is a former smoker quit many years ago.  She is on Eliquis and a statin.Has wound on the left second toe for the last 3 weeks evaluated by podiatry with x-ray no MRI was performed at.  Patient does not have pain.  Denies fevers or chills.    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[place]) 02/05/2017   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 05/20/2018   Acute kidney failure ([place]) 05/15/2018   Arthritis    "all over" (05/22/2018)   Blood dyscrasia    per pt-has small blood cells-appears as if anemic   Breast cancer, left breast ([place]) 1985   CHF (congestive heart failure) (HCC)    Complication of anesthesia    difficult to awaken from per pt   Coronary artery disease    Dyspnea    Essential hypertension    Heart disease    Heart murmur    History of gout    Hypothyroidism    Obesity (BMI 30-39.9) 11/11/2017   OSA (obstructive sleep apnea) 02/18/2017    severe obstructive sleep apnea with an AHI of 75.6/h and mild central sleep apnea with a CAI of 7.7/h.  Oxygen saturations dropped as low as 82%.   He is on CPAP at 9 cm H2O.   OSA on CPAP    Personal history of chemotherapy    Personal history of radiation therapy    Presence of permanent cardiac pacemaker    Primary localized osteoarthritis of right knee 05/13/2018   Refusal of blood transfusions as patient is Jehovah's Witness    Type II diabetes mellitus ([place])     Family History  Problem Relation Age of Onset   Multiple myeloma Mother    Heart disease Father    Other Sister    Heart disease Brother    Diabetes Sister    Other Sister    Past Surgical History:  Procedure Laterality Date   APPLICATION OF WOUND VAC Right 04/14/2021   Procedure: APPLICATION OF WOUND VAC;  Surgeon: Willaim Sheng, MD;  Location: [place];  Service: Orthopedics;  Laterality: Right;   BREAST BIOPSY Left 1985   BREAST LUMPECTOMY Left 1985   BREAST LUMPECTOMY WITH NEEDLE LOCALIZATION AND AXILLARY LYMPH NODE DISSECTION  1985   CATARACT EXTRACTION W/ INTRAOCULAR LENS  IMPLANT, BILATERAL Bilateral    CORONARY ANGIOPLASTY WITH STENT PLACEMENT  2010, 2012,2017    2 done 2010 and 1 replaced 2012 and 2 replaced in 2017   [place]     EXCISIONAL TOTAL KNEE ARTHROPLASTY WITH ANTIBIOTIC SPACERS Right 07/12/2021   Procedure: EXCISIONAL TOTAL KNEE ARTHROPLASTY WITH ANTIBIOTIC SPACERS;  Surgeon: Willaim Sheng, MD;  Location: [place];  Service: Orthopedics;  Laterality: Right;   I & D KNEE WITH POLY EXCHANGE Right 04/14/2021   Procedure: IRRIGATION AND DEBRIDEMENT KNEE WITH POLY EXCHANGE;  Surgeon: Willaim Sheng, MD;  Location: [place];  Service: Orthopedics;  Laterality: Right;   INSERT / REPLACE / REMOVE PACEMAKER  07/19/2014   JOINT REPLACEMENT     REPLACEMENT TOTAL KNEE Left 09/2012   THYROIDECTOMY, PARTIAL  1978   TONSILLECTOMY     TOTAL KNEE ARTHROPLASTY Right 05/13/2018   Procedure: TOTAL KNEE ARTHROPLASTY;  Surgeon: Marchia Bond, MD;  Location: [facility];  Service: Orthopedics;  Laterality: Right;   TOTAL KNEE REVISION Right 12/20/2021   Procedure: REMOVAL OF RIGHT KNEE ANTIBIOTIC SPACER AND REVISION RIGHT KNEE TOTAL ARTHROPLASTY;  Surgeon: Willaim Sheng, MD;  Location: [facility];  Service: Orthopedics;  Laterality: Right;   TUBAL LIGATION      Short Social History:  Social History   Tobacco Use   Smoking status: Former     Packs/day: 1.00    Years: 22.00    Total pack years: 22.00    Types: Cigarettes    Quit date: 45    Years since quitting: 41.1   Smokeless tobacco: Never  Substance Use Topics   Alcohol use: Yes    Comment: 05/22/2018 "glass of wine couple times/wk"    Allergies  Allergen Reactions   Codeine Other (See Comments)   Other Other (See Comments)    Blood Product Refusal (Jehovah's witness)     Tape Other (See Comments)    Leaves blisters and marks on the skin   Sulfa Antibiotics Rash   Uloric [Febuxostat] Rash    Current Outpatient Medications  Medication Sig Dispense Refill   acetaminophen (TYLENOL) 500 MG tablet Take 500-1,000 mg by mouth every 6 (six) hours as needed (for pain or headaches).     allopurinol (ZYLOPRIM) 100 MG tablet Take 100 mg by mouth daily.     apixaban (ELIQUIS) 5 MG TABS tablet Take 1 tablet (5 mg total) by mouth 2 (two) times daily. 60 tablet 11   atorvastatin (LIPITOR) 80 MG tablet Take 1 tablet (80 mg total) by mouth at bedtime. 90 tablet 3   Biotin 10 MG TABS Take 10 mg by mouth daily.     Blood Glucose Monitoring Suppl (ONETOUCH VERIO) w/Device KIT Use to check blood sugar 1 kit 0   carvedilol (COREG) 3.125 MG tablet Take 1 tablet (3.125 mg total) by mouth 2 (two) times daily with a meal. 60 tablet 6   celecoxib (CELEBREX) 100 MG capsule Take 100 mg by mouth 2 (two) times daily.     clotrimazole-betamethasone (LOTRISONE) lotion Apply 1 application topically 2 (two) times daily as needed (itching).     Continuous Blood Gluc Sensor (DEXCOM G6 SENSOR) MISC 1 Device by Does not apply route as directed. 9 each 3   doxycycline (VIBRA-TABS) 100 MG tablet Take 1 tablet (100 mg total) by mouth 2 (two) times daily for 14 days. 28 tablet 0   empagliflozin (JARDIANCE) 10 MG TABS tablet Take 1 tablet (10 mg total) by mouth daily. 30 tablet 11   Ferrous Sulfate (IRON) 325 (65 Fe) MG TABS Take 1 tablet (325 mg total) by mouth 2 (two) times daily before a meal. 99991111  tablet 3   folic acid (FOLVITE) 1 MG tablet Take 1 tablet (1 mg total) by mouth daily. 30 tablet 2   gabapentin (NEURONTIN) 300 MG capsule Take 300 mg by mouth 2 (two) times daily.     Insulin Glargine (BASAGLAR KWIKPEN) 100 UNIT/ML Inject 36 Units into the skin at bedtime. 45 mL 0   insulin lispro (HUMALOG) 100 UNIT/ML  injection Inject 0.05 mLs (5 Units total) into the skin 3 (three) times daily before meals. 10 mL 0   Lancets (ONETOUCH DELICA PLUS 123XX123) MISC USE   TO CHECK GLUCOSE 4 TIMES DAILY 100 each 3   latanoprost (XALATAN) 0.005 % ophthalmic solution Place 1 drop into both eyes at bedtime.     levothyroxine (SYNTHROID, LEVOTHROID) 75 MCG tablet Take 75 mcg by mouth daily before breakfast.     losartan (COZAAR) 25 MG tablet Take 1 tablet (25 mg total) by mouth daily. 30 tablet 6   LUMIGAN 0.01 % SOLN Place 1 drop into both eyes at bedtime. (Patient not taking: Reported on 06/04/2022)     Multiple Vitamins-Minerals (ALIVE ONCE DAILY WOMENS 50+ PO) Take 1 tablet by mouth daily.     spironolactone (ALDACTONE) 25 MG tablet Take 0.5 tablets (12.5 mg total) by mouth daily. 45 tablet 3   torsemide (DEMADEX) 20 MG tablet Take 1 tablet (20 mg total) by mouth as needed. (Patient taking differently: Take 20 mg by mouth daily as needed (edema).) 30 tablet 11   Travoprost, BAK Free, (TRAVATAN) 0.004 % SOLN ophthalmic solution Place 1 drop into both eyes at bedtime. (Patient not taking: Reported on 06/04/2022)     No current facility-administered medications for this visit.    Review of Systems  Constitutional:  Constitutional negative. HENT: HENT negative.  Eyes: Eyes negative.  Respiratory: Respiratory negative.  Cardiovascular: Cardiovascular negative.  GI: Gastrointestinal negative.  Musculoskeletal: Musculoskeletal negative.  Skin: Positive for wound.  Neurological: Neurological negative. Hematologic: Hematologic/lymphatic negative.  Psychiatric: Psychiatric negative.         Objective:  Objective   Vitals:   07/18/22 1350  BP: (!) 92/48  Pulse: 78  Resp: 20  Temp: 97.8 F (36.6 C)  SpO2: 96%     Physical Exam HENT:     Head: Normocephalic.     Nose: Nose normal.  Eyes:     Pupils: Pupils are equal, round, and reactive to light.  Cardiovascular:     Rate and Rhythm: Normal rate.     Pulses:          Popliteal pulses are 0 on the right side and 0 on the left side.       Dorsalis pedis pulses are 0 on the right side and 0 on the left side.       Posterior tibial pulses are 0 on the right side and 0 on the left side.  Pulmonary:     Effort: Pulmonary effort is normal.  Abdominal:     General: Abdomen is flat.  Musculoskeletal:        General: Normal range of motion.     Cervical back: Normal range of motion and neck supple.     Right lower leg: No edema.     Left lower leg: No edema.  Skin:    General: Skin is warm.  Neurological:     General: No focal deficit present.     Mental Status: She is alert.  Psychiatric:        Mood and Affect: Mood normal.        Thought Content: Thought content normal.        Judgment: Judgment normal.        Data: ABI Findings:  +---------+------------------+-----+----------+--------+  Right   Rt Pressure (mmHg)IndexWaveform  Comment   +---------+------------------+-----+----------+--------+  Brachial 122                                        +---------+------------------+-----+----------+--------+  PTA     128               1.05 monophasic          +---------+------------------+-----+----------+--------+  DP                             monophasicCNO       +---------+------------------+-----+----------+--------+  Great Toe47                0.39 Abnormal            +---------+------------------+-----+----------+--------+   +---------+------------------+-----+-----------+-------+  Left    Lt Pressure (mmHg)IndexWaveform   Comment   +---------+------------------+-----+-----------+-------+  Brachial 117                                        +---------+------------------+-----+-----------+-------+  PTA                            multiphasicCNO      +---------+------------------+-----+-----------+-------+  DP                             multiphasicCNO      +---------+------------------+-----+-----------+-------+  Great Toe42                0.34                     +---------+------------------+-----+-----------+-------+   +-------+-----------+-----------+------------+------------+  ABI/TBIToday's ABIToday's TBIPrevious ABIPrevious TBI  +-------+-----------+-----------+------------+------------+  Right [place]         0.39       [place]          0.82          +-------+-----------+-----------+------------+------------+  Left  [place]         0.34       1.22        1.48          +-------+-----------+-----------+------------+------------+        Arterial wall calcification precludes accurate ankle pressures and ABIs.  Bilateral TBIs appear decreased. Right ABIs appear essentially unchanged.  Left ABI is non compressible.    Summary:  Right: Resting right ankle-brachial index indicates noncompressible right  lower extremity arteries. The right toe-brachial index is abnormal. PPG  tracings appear dampened.   Left: Resting left ankle-brachial index indicates noncompressible left  lower extremity arteries. The left toe-brachial index is abnormal. PPG  tracings appear dampened.       Assessment/Plan:    77 year old female with history of peripheral arterial disease and diabetes.  Now with ulceration of the left second toe with decreased ABIs and toe pressures without palpable pulses.  Plan will be for aortogram with left lower extremity angiography for right common femoral approach in the near future.  We discussed the risk benefits and alternatives as well as possibility of no  intervention possibility of needing bypass.  She remains at high risk for secondary to amputation even if we are able to improve the blood flow and she demonstrates good understanding of this.  She will need to hold Eliquis 48 hours prior to procedure.      Waynetta Sandy MD [facility]

## 2022-07-18 NOTE — Progress Notes (Signed)
Patient ID: Danielle [name], female   DOB: 12/02/1945, 77 y.o.   MRN: [id number]  Reason for Consult: No chief complaint on file.   Referred by [name], DPM  Subjective:     HPI:  Danielle [name] is a 77 y.o. female with history of diabetes, congestive heart failure, coronary artery disease I had previously seen 2 years ago for discoloration of her second toe.  She continues to walk with the help of a walker.  She now has ulceration on the toe.  She has never had vascular invention.  She is a former smoker quit many years ago.  She is on Eliquis and a statin.Has wound on the left second toe for the last 3 weeks evaluated by podiatry with x-ray no MRI was performed at.  Patient does not have pain.  Denies fevers or chills.    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[place]) 02/05/2017   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 05/20/2018   Acute kidney failure ([place]) 05/15/2018   Arthritis    "all over" (05/22/2018)   Blood dyscrasia    per pt-has small blood cells-appears as if anemic   Breast cancer, left breast ([place]) 1985   CHF (congestive heart failure) ([facility])    Complication of anesthesia    difficult to awaken from per pt   Coronary artery disease    Dyspnea    Essential hypertension    Heart disease    Heart murmur    History of gout    Hypothyroidism    Obesity (BMI 30-39.9) 11/11/2017   OSA (obstructive sleep apnea) 02/18/2017    severe obstructive sleep apnea with an AHI of 75.6/h and mild central sleep apnea with a CAI of 7.7/h.  Oxygen saturations dropped as low as 82%.   He is on CPAP at 9 cm H2O.   OSA on CPAP    Personal history of chemotherapy    Personal history of radiation therapy    Presence of permanent cardiac pacemaker    Primary localized osteoarthritis of right knee 05/13/2018   Refusal of blood transfusions as patient is Jehovah's Witness    Type II diabetes mellitus ([place])     Family History  Problem Relation Age of Onset   Multiple myeloma Mother    Heart disease Father    Other Sister    Heart disease Brother    Diabetes Sister    Other Sister    Past Surgical History:  Procedure Laterality Date   APPLICATION OF WOUND VAC Right 04/14/2021   Procedure: APPLICATION OF WOUND VAC;  Surgeon: Willaim Sheng, MD;  Location: [facility];  Service: Orthopedics;  Laterality: Right;   BREAST BIOPSY Left 1985   BREAST LUMPECTOMY Left 1985   BREAST LUMPECTOMY WITH NEEDLE LOCALIZATION AND AXILLARY LYMPH NODE DISSECTION  1985   CATARACT EXTRACTION W/ INTRAOCULAR LENS  IMPLANT, BILATERAL Bilateral    CORONARY ANGIOPLASTY WITH STENT PLACEMENT  2010, 2012,2017    2 done 2010 and 1 replaced 2012 and 2 replaced in 2017   [place]     EXCISIONAL TOTAL KNEE ARTHROPLASTY WITH ANTIBIOTIC SPACERS Right 07/12/2021   Procedure: EXCISIONAL TOTAL KNEE ARTHROPLASTY WITH ANTIBIOTIC SPACERS;  Surgeon: Willaim Sheng, MD;  Location: [facility];  Service: Orthopedics;  Laterality: Right;   I & D KNEE WITH POLY EXCHANGE Right 04/14/2021   Procedure: IRRIGATION AND DEBRIDEMENT KNEE WITH POLY EXCHANGE;  Surgeon: Willaim Sheng, MD;  Location: [facility];  Service: Orthopedics;  Laterality: Right;   INSERT / REPLACE / REMOVE PACEMAKER  07/19/2014   JOINT REPLACEMENT     REPLACEMENT TOTAL KNEE Left 09/2012   THYROIDECTOMY, PARTIAL  1978   TONSILLECTOMY     TOTAL KNEE ARTHROPLASTY Right 05/13/2018   Procedure: TOTAL KNEE ARTHROPLASTY;  Surgeon: Marchia Bond, MD;  Location: [facility];  Service: Orthopedics;  Laterality: Right;   TOTAL KNEE REVISION Right 12/20/2021   Procedure: REMOVAL OF RIGHT KNEE ANTIBIOTIC SPACER AND REVISION RIGHT KNEE TOTAL ARTHROPLASTY;  Surgeon: Willaim Sheng, MD;  Location: [facility];  Service: Orthopedics;  Laterality: Right;   TUBAL LIGATION      Short Social History:  Social History   Tobacco Use   Smoking status: Former     Packs/day: 1.00    Years: 22.00    Total pack years: 22.00    Types: Cigarettes    Quit date: 1    Years since quitting: 41.1   Smokeless tobacco: Never  Substance Use Topics   Alcohol use: Yes    Comment: 05/22/2018 "glass of wine couple times/wk"    Allergies  Allergen Reactions   Codeine Other (See Comments)   Other Other (See Comments)    Blood Product Refusal (Jehovah's witness)     Tape Other (See Comments)    Leaves blisters and marks on the skin   Sulfa Antibiotics Rash   Uloric [Febuxostat] Rash    Current Outpatient Medications  Medication Sig Dispense Refill   acetaminophen (TYLENOL) 500 MG tablet Take 500-1,000 mg by mouth every 6 (six) hours as needed (for pain or headaches).     allopurinol (ZYLOPRIM) 100 MG tablet Take 100 mg by mouth daily.     apixaban (ELIQUIS) 5 MG TABS tablet Take 1 tablet (5 mg total) by mouth 2 (two) times daily. 60 tablet 11   atorvastatin (LIPITOR) 80 MG tablet Take 1 tablet (80 mg total) by mouth at bedtime. 90 tablet 3   Biotin 10 MG TABS Take 10 mg by mouth daily.     Blood Glucose Monitoring Suppl (ONETOUCH VERIO) w/Device KIT Use to check blood sugar 1 kit 0   carvedilol (COREG) 3.125 MG tablet Take 1 tablet (3.125 mg total) by mouth 2 (two) times daily with a meal. 60 tablet 6   celecoxib (CELEBREX) 100 MG capsule Take 100 mg by mouth 2 (two) times daily.     clotrimazole-betamethasone (LOTRISONE) lotion Apply 1 application topically 2 (two) times daily as needed (itching).     Continuous Blood Gluc Sensor (DEXCOM G6 SENSOR) MISC 1 Device by Does not apply route as directed. 9 each 3   doxycycline (VIBRA-TABS) 100 MG tablet Take 1 tablet (100 mg total) by mouth 2 (two) times daily for 14 days. 28 tablet 0   empagliflozin (JARDIANCE) 10 MG TABS tablet Take 1 tablet (10 mg total) by mouth daily. 30 tablet 11   Ferrous Sulfate (IRON) 325 (65 Fe) MG TABS Take 1 tablet (325 mg total) by mouth 2 (two) times daily before a meal. 99991111  tablet 3   folic acid (FOLVITE) 1 MG tablet Take 1 tablet (1 mg total) by mouth daily. 30 tablet 2   gabapentin (NEURONTIN) 300 MG capsule Take 300 mg by mouth 2 (two) times daily.     Insulin Glargine (BASAGLAR KWIKPEN) 100 UNIT/ML Inject 36 Units into the skin at bedtime. 45 mL 0   insulin lispro (HUMALOG) 100 UNIT/ML  injection Inject 0.05 mLs (5 Units total) into the skin 3 (three) times daily before meals. 10 mL 0   Lancets (ONETOUCH DELICA PLUS 123XX123) MISC USE   TO CHECK GLUCOSE 4 TIMES DAILY 100 each 3   latanoprost (XALATAN) 0.005 % ophthalmic solution Place 1 drop into both eyes at bedtime.     levothyroxine (SYNTHROID, LEVOTHROID) 75 MCG tablet Take 75 mcg by mouth daily before breakfast.     losartan (COZAAR) 25 MG tablet Take 1 tablet (25 mg total) by mouth daily. 30 tablet 6   LUMIGAN 0.01 % SOLN Place 1 drop into both eyes at bedtime. (Patient not taking: Reported on 06/04/2022)     Multiple Vitamins-Minerals (ALIVE ONCE DAILY WOMENS 50+ PO) Take 1 tablet by mouth daily.     spironolactone (ALDACTONE) 25 MG tablet Take 0.5 tablets (12.5 mg total) by mouth daily. 45 tablet 3   torsemide (DEMADEX) 20 MG tablet Take 1 tablet (20 mg total) by mouth as needed. (Patient taking differently: Take 20 mg by mouth daily as needed (edema).) 30 tablet 11   Travoprost, BAK Free, (TRAVATAN) 0.004 % SOLN ophthalmic solution Place 1 drop into both eyes at bedtime. (Patient not taking: Reported on 06/04/2022)     No current facility-administered medications for this visit.    Review of Systems  Constitutional:  Constitutional negative. HENT: HENT negative.  Eyes: Eyes negative.  Respiratory: Respiratory negative.  Cardiovascular: Cardiovascular negative.  GI: Gastrointestinal negative.  Musculoskeletal: Musculoskeletal negative.  Skin: Positive for wound.  Neurological: Neurological negative. Hematologic: Hematologic/lymphatic negative.  Psychiatric: Psychiatric negative.         Objective:  Objective   Vitals:   07/18/22 1350  BP: (!) 92/48  Pulse: 78  Resp: 20  Temp: 97.8 F (36.6 C)  SpO2: 96%     Physical Exam HENT:     Head: Normocephalic.     Nose: Nose normal.  Eyes:     Pupils: Pupils are equal, round, and reactive to light.  Cardiovascular:     Rate and Rhythm: Normal rate.     Pulses:          Popliteal pulses are 0 on the right side and 0 on the left side.       Dorsalis pedis pulses are 0 on the right side and 0 on the left side.       Posterior tibial pulses are 0 on the right side and 0 on the left side.  Pulmonary:     Effort: Pulmonary effort is normal.  Abdominal:     General: Abdomen is flat.  Musculoskeletal:        General: Normal range of motion.     Cervical back: Normal range of motion and neck supple.     Right lower leg: No edema.     Left lower leg: No edema.  Skin:    General: Skin is warm.  Neurological:     General: No focal deficit present.     Mental Status: She is alert.  Psychiatric:        Mood and Affect: Mood normal.        Thought Content: Thought content normal.        Judgment: Judgment normal.        Data: ABI Findings:  +---------+------------------+-----+----------+--------+  Right   Rt Pressure (mmHg)IndexWaveform  Comment   +---------+------------------+-----+----------+--------+  Brachial 122                                        +---------+------------------+-----+----------+--------+  PTA     128               1.05 monophasic          +---------+------------------+-----+----------+--------+  DP                             monophasicCNO       +---------+------------------+-----+----------+--------+  Great Toe47                0.39 Abnormal            +---------+------------------+-----+----------+--------+   +---------+------------------+-----+-----------+-------+  Left    Lt Pressure (mmHg)IndexWaveform   Comment   +---------+------------------+-----+-----------+-------+  Brachial 117                                        +---------+------------------+-----+-----------+-------+  PTA                            multiphasicCNO      +---------+------------------+-----+-----------+-------+  DP                             multiphasicCNO      +---------+------------------+-----+-----------+-------+  Great Toe42                0.34                     +---------+------------------+-----+-----------+-------+   +-------+-----------+-----------+------------+------------+  ABI/TBIToday's ABIToday's TBIPrevious ABIPrevious TBI  +-------+-----------+-----------+------------+------------+  Right [place]         0.39       [place]          0.82          +-------+-----------+-----------+------------+------------+  Left  [place]         0.34       1.22        1.48          +-------+-----------+-----------+------------+------------+        Arterial wall calcification precludes accurate ankle pressures and ABIs.  Bilateral TBIs appear decreased. Right ABIs appear essentially unchanged.  Left ABI is non compressible.    Summary:  Right: Resting right ankle-brachial index indicates noncompressible right  lower extremity arteries. The right toe-brachial index is abnormal. PPG  tracings appear dampened.   Left: Resting left ankle-brachial index indicates noncompressible left  lower extremity arteries. The left toe-brachial index is abnormal. PPG  tracings appear dampened.       Assessment/Plan:    77 year old female with history of peripheral arterial disease and diabetes.  Now with ulceration of the left second toe with decreased ABIs and toe pressures without palpable pulses.  Plan will be for aortogram with left lower extremity angiography for right common femoral approach in the near future.  We discussed the risk benefits and alternatives as well as possibility of no  intervention possibility of needing bypass.  She remains at high risk for secondary to amputation even if we are able to improve the blood flow and she demonstrates good understanding of this.  She will need to hold Eliquis 48 hours prior to procedure.      Waynetta Sandy MD [facility]

## 2022-07-19 ENCOUNTER — Other Ambulatory Visit: Payer: Self-pay

## 2022-07-19 ENCOUNTER — Other Ambulatory Visit (HOSPITAL_COMMUNITY): Payer: Self-pay

## 2022-07-19 DIAGNOSIS — I70222 Atherosclerosis of native arteries of extremities with rest pain, left leg: Secondary | ICD-10-CM

## 2022-07-19 NOTE — Progress Notes (Signed)
Paramedicine Encounter    Patient ID: Danielle [name], female    DOB: November 20, 1945, 77 y.o.   MRN: [id number]   Arrived for home visit for [facility] for a med rec. She reports feeling well. She was seen yesterday by Dr. Donzetta Matters she plans to have surgery on 3/4. We reviewed meds and I filled pill box for two weeks. Refills as noted will be called into [facility]. We reviewed appointments and I wrote down same. I picked up dexcoms for her from her mailbox and placed one on her and re-educated her on use of same. I will plan to see [name] in two weeks. She agreed with plan. Visit complete.   [name], EMT-Paramedic [phone] 07/19/2022    ACTION: Home visit completed

## 2022-07-20 ENCOUNTER — Telehealth (HOSPITAL_COMMUNITY): Payer: Self-pay | Admitting: Cardiology

## 2022-07-20 MED ORDER — APIXABAN 5 MG PO TABS: 5.0000 mg | ORAL_TABLET | Freq: Two times a day (BID) | ORAL | 11 refills | Status: DC

## 2022-07-20 NOTE — Telephone Encounter (Signed)
Rx sent 

## 2022-07-20 NOTE — Telephone Encounter (Signed)
-----   Message from [name], Paramedic sent at 07/19/2022  6:10 PM EST ----- Regarding: refill Can we send updated RX for Eliquis to [facility] on [address] for Mrs. [name]. Thank you.

## 2022-07-23 ENCOUNTER — Encounter: Payer: Self-pay | Admitting: Podiatry

## 2022-07-23 ENCOUNTER — Ambulatory Visit: Payer: Medicare HMO | Admitting: Podiatry

## 2022-07-23 DIAGNOSIS — L97524 Non-pressure chronic ulcer of other part of left foot with necrosis of bone: Secondary | ICD-10-CM

## 2022-07-23 DIAGNOSIS — E1151 Type 2 diabetes mellitus with diabetic peripheral angiopathy without gangrene: Secondary | ICD-10-CM | POA: Diagnosis not present

## 2022-07-23 MED ORDER — DOXYCYCLINE HYCLATE 100 MG PO TABS: 100.0000 mg | ORAL_TABLET | Freq: Two times a day (BID) | ORAL | 0 refills | Status: AC

## 2022-07-23 NOTE — Progress Notes (Signed)
Subjective:  Patient ID: Danielle [name], female    DOB: 10-Jun-1945,   MRN: [id number]  Chief Complaint  Patient presents with   Foot Ulcer    Left foot second toe ulcer. Patient states lately toe has been throbbing     77 y.o. female presents for follow-up of left second toe wound. Marland Kitchen Has been dressing as instructed .Finished course of doxycycline. Has vascular procedure set for 3/4.  Denies any other pedal complaints. Denies n/v/f/c.   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[place]) 02/05/2017   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 05/20/2018   Acute kidney failure ([place]) 05/15/2018   Arthritis    "all over" (05/22/2018)   Blood dyscrasia    per pt-has small blood cells-appears as if anemic   Breast cancer, left breast ([place]) 1985   CHF (congestive heart failure) ([facility])    Complication of anesthesia    difficult to awaken from per pt   Coronary artery disease    Dyspnea    Essential hypertension    Heart disease    Heart murmur    History of gout    Hypothyroidism    Obesity (BMI 30-39.9) 11/11/2017   OSA (obstructive sleep apnea) 02/18/2017    severe obstructive sleep apnea with an AHI of 75.6/h and mild central sleep apnea with a CAI of 7.7/h.  Oxygen saturations dropped as low as 82%.   He is on CPAP at 9 cm H2O.   OSA on CPAP    Personal history of chemotherapy    Personal history of radiation therapy    Presence of permanent cardiac pacemaker    Primary localized osteoarthritis of right knee 05/13/2018   Refusal of blood transfusions as patient is Jehovah's Witness    Type II diabetes mellitus ([place])     Objective:  Physical Exam: Vascular: DP/PT pulses 2/4 bilateral. CFT <3 seconds. Absent hair growth on digits. Edema noted to bilateral lower extremities. Xerosis noted bilaterally.  Skin. No lacerations or abrasions bilateral feet. Nails 1-5 bilateral  are thickened discolored and  with  subungual debris. Left second digit ulceration about 0.5 cm x 0.5 cm x 0.5 cm with overyling hyperkeratosis.  Some edema and erythema noted to the digit. No purulence. Does probe to bone today.  Musculoskeletal: MMT 5/5 bilateral lower extremities in DF, PF, Inversion and Eversion. Deceased ROM in DF of ankle joint.  Neurological: Sensation intact to light touch. Protective sensation diminished bilateral.    Assessment:   1. 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 Type II diabetes mellitus with peripheral circulatory disorder [facility])        Plan:  Patient was evaluated and treated and all questions answered. Ulcer left second digit with necrosis of bone X-rays left foot no significant difference noted from previous. Possible erosions of the distal phalanx.  -No debridement today.  -Dressed with betadine, DSD. -Off-loading with surgical shoe. -Doxycycline refilled Scheduled for vascular procedure on 3/4.  MRI ordered. Not scheduled unfortunately until 4/2.  WBC 8.2  CRP and ESR normal range.  Culture essentially no growth.  -Did discuss hospitalization with patient and inpatient care for the infection in her bone. Toe currently appears stable and will plan to try and remain outpatient. With MRI and outpatient surgical treatment. Did discuss if any worsening in the meantime to head to the hospital.  -Discussed glucose control and  proper protein-rich diet.  -Discussed if any worsening redness, pain, fever or chills to call or may need to report to the emergency room. Patient expressed understanding.     No follow-ups on file.   [name], DPM

## 2022-07-24 ENCOUNTER — Telehealth (HOSPITAL_COMMUNITY): Payer: Self-pay

## 2022-07-24 NOTE — Telephone Encounter (Signed)
Spoke to [employer]  and called in refills for the following: -Eliquis -Levothyroxine -Torsemide -Losartan  -Spironolactone  All will be ready for pick up by tomorrow- I called Danielle [name] to make her aware  and she is agreeable to pick up in the next few days. Call complete.   [name], [place] 07/24/2022

## 2022-07-30 ENCOUNTER — Ambulatory Visit (HOSPITAL_COMMUNITY)
Admission: RE | Admit: 2022-07-30 | Discharge: 2022-07-30 | Disposition: A | Discharge status: Home / Self Care | Payer: Medicare HMO | Attending: Vascular Surgery | Admitting: Vascular Surgery

## 2022-07-30 ENCOUNTER — Ambulatory Visit (HOSPITAL_COMMUNITY)
Admission: RE | Disposition: A | Discharge status: Home / Self Care | Payer: Self-pay | Source: Home / Self Care | Attending: Vascular Surgery

## 2022-07-30 ENCOUNTER — Other Ambulatory Visit: Payer: Self-pay

## 2022-07-30 DIAGNOSIS — I70245 Atherosclerosis of native arteries of left leg with ulceration of other part of foot: Secondary | ICD-10-CM | POA: Diagnosis not present

## 2022-07-30 DIAGNOSIS — L97529 Non-pressure chronic ulcer of other part of left foot with unspecified severity: Secondary | ICD-10-CM | POA: Diagnosis not present

## 2022-07-30 DIAGNOSIS — I2510 Atherosclerotic heart disease of native coronary artery without angina pectoris: Secondary | ICD-10-CM | POA: Diagnosis not present

## 2022-07-30 DIAGNOSIS — I70222 Atherosclerosis of native arteries of extremities with rest pain, left leg: Secondary | ICD-10-CM

## 2022-07-30 DIAGNOSIS — Z794 Long term (current) use of insulin: Secondary | ICD-10-CM | POA: Insufficient documentation

## 2022-07-30 DIAGNOSIS — I110 Hypertensive heart disease with heart failure: Secondary | ICD-10-CM | POA: Insufficient documentation

## 2022-07-30 DIAGNOSIS — E11621 Type 2 diabetes mellitus with foot ulcer: Secondary | ICD-10-CM | POA: Diagnosis not present

## 2022-07-30 DIAGNOSIS — Z79899 Other long term (current) drug therapy: Secondary | ICD-10-CM | POA: Insufficient documentation

## 2022-07-30 DIAGNOSIS — Z7901 Long term (current) use of anticoagulants: Secondary | ICD-10-CM | POA: Insufficient documentation

## 2022-07-30 DIAGNOSIS — E1151 Type 2 diabetes mellitus with diabetic peripheral angiopathy without gangrene: Secondary | ICD-10-CM | POA: Diagnosis not present

## 2022-07-30 DIAGNOSIS — I5042 Chronic combined systolic (congestive) and diastolic (congestive) heart failure: Secondary | ICD-10-CM | POA: Diagnosis not present

## 2022-07-30 DIAGNOSIS — Z87891 Personal history of nicotine dependence: Secondary | ICD-10-CM | POA: Insufficient documentation

## 2022-07-30 HISTORY — PX: ABDOMINAL AORTOGRAM W/LOWER EXTREMITY: CATH118223

## 2022-07-30 HISTORY — PX: PERIPHERAL VASCULAR INTERVENTION: CATH118257

## 2022-07-30 HISTORY — PX: PERIPHERAL VASCULAR ATHERECTOMY: CATH118256

## 2022-07-30 LAB — POCT I-STAT, CHEM 8
BUN: 34 mg/dL — ABNORMAL HIGH (ref 8–23)
Calcium, Ion: 1.19 mmol/L (ref 1.15–1.40)
Chloride: 107 mmol/L (ref 98–111)
Creatinine, Ser: 1.5 mg/dL — ABNORMAL HIGH (ref 0.44–1.00)
Glucose, Bld: 226 mg/dL — ABNORMAL HIGH (ref 70–99)
HCT: 34 % — ABNORMAL LOW (ref 36.0–46.0)
Hemoglobin: 11.6 g/dL — ABNORMAL LOW (ref 12.0–15.0)
Potassium: 4.4 mmol/L (ref 3.5–5.1)
Sodium: 141 mmol/L (ref 135–145)
TCO2: 24 mmol/L (ref 22–32)

## 2022-07-30 LAB — GLUCOSE, CAPILLARY
Glucose-Capillary: 237 mg/dL — ABNORMAL HIGH (ref 70–99)
Glucose-Capillary: 173 mg/dL — ABNORMAL HIGH (ref 70–99)

## 2022-07-30 SURGERY — ABDOMINAL AORTOGRAM W/LOWER EXTREMITY
Anesthesia: LOCAL

## 2022-07-30 MED ORDER — HEPARIN SODIUM (PORCINE) 1000 UNIT/ML IJ SOLN
INTRAMUSCULAR | Status: DC | PRN
  Administered 2022-07-30: 5000 [IU] via INTRAVENOUS

## 2022-07-30 MED ORDER — LIDOCAINE HCL (PF) 1 % IJ SOLN
INTRAMUSCULAR | Status: AC
  Filled 2022-07-30: qty 30

## 2022-07-30 MED ORDER — LABETALOL HCL 5 MG/ML IV SOLN: 10.0000 mg | INTRAVENOUS | Status: DC | PRN

## 2022-07-30 MED ORDER — ONDANSETRON HCL 4 MG/2ML IJ SOLN: 4.0000 mg | Freq: Four times a day (QID) | INTRAMUSCULAR | Status: DC | PRN

## 2022-07-30 MED ORDER — LIDOCAINE HCL (PF) 1 % IJ SOLN
INTRAMUSCULAR | Status: DC | PRN
  Administered 2022-07-30: 10 mL

## 2022-07-30 MED ORDER — SODIUM CHLORIDE 0.9 % IV SOLN: INTRAVENOUS | Status: DC

## 2022-07-30 MED ORDER — IODIXANOL 320 MG/ML IV SOLN
INTRAVENOUS | Status: DC | PRN
  Administered 2022-07-30: 20 mL via INTRA_ARTERIAL

## 2022-07-30 MED ORDER — HYDROMORPHONE HCL 1 MG/ML IJ SOLN: 0.5000 mg | INTRAMUSCULAR | Status: DC | PRN

## 2022-07-30 MED ORDER — SODIUM CHLORIDE 0.9 % IV SOLN: 250.0000 mL | INTRAVENOUS | Status: DC | PRN

## 2022-07-30 MED ORDER — HEPARIN SODIUM (PORCINE) 1000 UNIT/ML IJ SOLN
INTRAMUSCULAR | Status: AC
  Filled 2022-07-30: qty 10

## 2022-07-30 MED ORDER — SODIUM CHLORIDE 0.9% FLUSH: 3.0000 mL | Freq: Two times a day (BID) | INTRAVENOUS | Status: DC

## 2022-07-30 MED ORDER — OXYCODONE HCL 5 MG PO TABS: 5.0000 mg | ORAL_TABLET | ORAL | Status: DC | PRN

## 2022-07-30 MED ORDER — ASPIRIN 81 MG PO TBEC: 81.0000 mg | DELAYED_RELEASE_TABLET | Freq: Every day | ORAL | 0 refills | Status: DC

## 2022-07-30 MED ORDER — MIDAZOLAM HCL 5 MG/5ML IJ SOLN
INTRAMUSCULAR | Status: AC
  Filled 2022-07-30: qty 5

## 2022-07-30 MED ORDER — FENTANYL CITRATE (PF) 100 MCG/2ML IJ SOLN
INTRAMUSCULAR | Status: AC
  Filled 2022-07-30: qty 2

## 2022-07-30 MED ORDER — ASPIRIN 81 MG PO CHEW
CHEWABLE_TABLET | ORAL | Status: DC | PRN
  Administered 2022-07-30: 81 mg via ORAL

## 2022-07-30 MED ORDER — ACETAMINOPHEN 325 MG PO TABS: 650.0000 mg | ORAL_TABLET | ORAL | Status: DC | PRN

## 2022-07-30 MED ORDER — FENTANYL CITRATE (PF) 100 MCG/2ML IJ SOLN
INTRAMUSCULAR | Status: DC | PRN
  Administered 2022-07-30: 50 ug via INTRAVENOUS

## 2022-07-30 MED ORDER — HYDRALAZINE HCL 20 MG/ML IJ SOLN: 5.0000 mg | INTRAMUSCULAR | Status: DC | PRN

## 2022-07-30 MED ORDER — MIDAZOLAM HCL 2 MG/2ML IJ SOLN
INTRAMUSCULAR | Status: DC | PRN
  Administered 2022-07-30: 1 mg via INTRAVENOUS

## 2022-07-30 MED ORDER — SODIUM CHLORIDE 0.9 % WEIGHT BASED INFUSION: 1.0000 mL/kg/h | INTRAVENOUS | Status: DC

## 2022-07-30 MED ORDER — ASPIRIN 81 MG PO TBEC: 81.0000 mg | DELAYED_RELEASE_TABLET | Freq: Every day | ORAL | Status: DC

## 2022-07-30 MED ORDER — SODIUM CHLORIDE 0.9% FLUSH: 3.0000 mL | INTRAVENOUS | Status: DC | PRN

## 2022-07-30 MED ORDER — ASPIRIN 81 MG PO CHEW
CHEWABLE_TABLET | ORAL | Status: AC
  Filled 2022-07-30: qty 1

## 2022-07-30 MED ORDER — HEPARIN (PORCINE) IN NACL 1000-0.9 UT/500ML-% IV SOLN
INTRAVENOUS | Status: DC | PRN
  Administered 2022-07-30 (×2): 500 mL

## 2022-07-30 SURGICAL SUPPLY — 22 items
BALLN COYOTE ES OTW 3X40X145 (BALLOONS) ×2
BALLOON COYOTE ES OTW 3X40X145 (BALLOONS) IMPLANT
CATH AURYON ATHERECTOMY 1.5 (CATHETERS) IMPLANT
CATH OMNI FLUSH 5F 65CM (CATHETERS) IMPLANT
CATH QUICKCROSS SUPP .035X90CM (MICROCATHETER) IMPLANT
CATH SYNTRAX .014X150 (CATHETERS) IMPLANT
CLOSURE MYNX CONTROL 6F/7F (Vascular Products) IMPLANT
DEVICE TORQUE .025-.038 (MISCELLANEOUS) IMPLANT
GUIDEWIRE ANGLED .035X150CM (WIRE) IMPLANT
KIT ANGIASSIST CO2 SYSTEM (KITS) IMPLANT
KIT MICROPUNCTURE NIT STIFF (SHEATH) IMPLANT
KIT PV (KITS) ×2 IMPLANT
SHEATH CATAPULT 6FR 90 (SHEATH) IMPLANT
SHEATH PINNACLE 5F 10CM (SHEATH) IMPLANT
SHEATH PINNACLE 6F 10CM (SHEATH) IMPLANT
SHEATH PROBE COVER 6X72 (BAG) IMPLANT
SYR MEDRAD MARK 7 150ML (SYRINGE) ×2 IMPLANT
TRANSDUCER W/STOPCOCK (MISCELLANEOUS) ×2 IMPLANT
TRAY PV CATH (CUSTOM PROCEDURE TRAY) ×2 IMPLANT
WIRE BENTSON .035X145CM (WIRE) IMPLANT
WIRE ROSEN-J .035X260CM (WIRE) IMPLANT
WIRE SPARTACORE .014X300CM (WIRE) IMPLANT

## 2022-07-30 NOTE — Op Note (Signed)
    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 Donzetta Matters, MD Procedure Performed: 1.  Ultrasound-guided cannulation right common femoral artery 2.  CO2 aortogram 3.  Laser arthrectomy left popliteal and anterior tibial artery with 1.5 mm [place] 4.  Balloon angioplasty left popliteal and anterior tibial artery with 3 mm balloon 5.  Moderate sedation with fentanyl and Versed for 75 minutes 6.  Mynx device closure right common femoral artery  Indications: 77 year old female with a history of coronary artery disease now with left second toe ulceration decreased ABIs and toe pressure of 42.  She is now indicated for angiography with possible intervention.  Findings: Aortoiliac segments of bilateral renal arteries are patent.  Left common femoral artery and SFA are patent as is the popliteal artery.  There is a 70% stenosis of the takeoff of the anterior tibial artery which is the dominant runoff to the foot.  There is also diminutive posterior tibial artery that runs off to the foot but does not feel the distal arch as the anterior tibial artery does.  After intervention there is less than 20% stenosis at the takeoff of the anterior tibial artery and a very strong signal in the foot at the dorsalis pedis artery.  Patient can resume Eliquis tomorrow and we will place her on aspirin for at least 6 weeks and she will continue statin and will be high risk for toe amputation.   Procedure:  The patient was identified in the holding area and taken to room 8.  The patient was then placed supine on the table and prepped and draped in the usual sterile fashion.  A time out was called.  Ultrasound was used to evaluate the right common femoral artery which was noted to be patent.  The area was anesthetized 1% lidocaine  cannulated with a micropuncture needle followed by wire and the sheath.  And image was saved to the permanent record.  Placed a Bentson wire followed by 5 [place] sheath and Omni catheter to the level of L1 and perform CO2 aortogram.  We then crossed the bifurcation with Omni catheter and ultimately required Glidewire and quick cross catheter and perform left lower extremity angiography with CO2.  We then placed a Glidewire and quick cross to the above the knee and performed contrasted angiography which demonstrated the tight stenosis at the anterior tibial artery takeoff which was the dominant runoff.  Satisfied with this we placed a Rosen wire followed by a long 6 [place] sheath patient was fully heparinized.  We then crossed the lesion using an 014 wire then primarily laser atherectomized with 1.5 mm laser at low and high power and then balloon angioplasty was performed with 3 mm balloon at nominal pressure for 2 minutes.  Completion demonstrated minimal residual stenosis less than 20% and there was brisk flow all the way to the foot.  There was a very strong signal in the foot.  Satisfied with this we exchanged for a short 6 [place] sheath deployed a minx device.  Patient tolerated procedure without immediate complication.  Contrast: 20 cc   Jenasia Dolinar C. Donzetta Matters, MD [facility] Office: [phone] Pager: [phone]

## 2022-07-30 NOTE — Progress Notes (Signed)
Up and walked and tolerated well; right groin stable, no bleeding or hematoma 

## 2022-07-30 NOTE — Interval H&P Note (Signed)
History and Physical Interval Note:  07/30/2022 7:24 AM  Danielle [name]  has presented today for surgery, with the diagnosis of critical limb ischemia.  The various methods of treatment have been discussed with the patient and family. After consideration of risks, benefits and other options for treatment, the patient has consented to  Procedure(s): ABDOMINAL AORTOGRAM W/LOWER EXTREMITY (N/A) as a surgical intervention.  The patient's history has been reviewed, patient examined, no change in status, stable for surgery.  I have reviewed the patient's chart and labs.  Questions were answered to the patient's satisfaction.     Servando Snare

## 2022-07-31 ENCOUNTER — Encounter (HOSPITAL_COMMUNITY): Payer: Self-pay | Admitting: Vascular Surgery

## 2022-07-31 MED FILL — Midazolam HCl Inj 5 MG/5ML (Base Equivalent): INTRAMUSCULAR | Qty: 1 | Status: AC

## 2022-08-02 ENCOUNTER — Other Ambulatory Visit (HOSPITAL_COMMUNITY): Payer: Self-pay | Admitting: Internal Medicine

## 2022-08-02 ENCOUNTER — Other Ambulatory Visit (HOSPITAL_COMMUNITY): Payer: Self-pay

## 2022-08-02 NOTE — Progress Notes (Signed)
Paramedicine Encounter    Patient ID: Danielle [name], female    DOB: 1946-04-29, 77 y.o.   MRN: [id number]   Complaints- no complaints, feeling well considering recent procedure- reports no pain just some mild discomfort at times but overall doing well.   Assessment- CAOx4, warm and dry ambulatory with no shortness of breath, no complaints of dizziness, chest pain or swelling. No lower leg edema. Lungs clear.   Compliance with meds- no missed oral medications, has admitted to missing some insulin doses.   Pill box filled- for one week.   Refills needed- jardiance, carvedilol   Meds changes since last visit- none     Social changes- none    BP 102/64   Pulse 74   Resp 16   Wt 205 lb (93 kg)   SpO2 96%   BMI 35.19 kg/m  Weight yesterday-- didn't weigh  Last visit weight-- 205lbs  Arrived for home visit for [facility] who reports feeling well denied chest pain, shortness of breath, dizziness, weight gain, swelling. She has been compliant with her oral medications over the last two weeks but admits to missing some of her insulin doses. Today she does not have her dexcom on and admits she has not been checking her sugar manually because her fingers hurt. I checked CBG and it was 320. She was instructed to take her insulin as prescribed. She did so. Meds reviewed and confirmed. Pill box filled for one week. Vitals and assessment as noted. We reviewed upcoming appointments and confirmed same. I provided HF and diabetic education on diet, med compliance and care goals. She agreed with plan. Home visit complete.      Danielle [name], [place]  ACTION: Home visit completed    Patient Care Team: Seward Carol, MD as PCP - General (Internal Medicine) Constance Haw, MD as PCP - Electrophysiology (Cardiology) Sueanne Margarita, MD as PCP - Sleep Medicine (Cardiology) Bensimhon, Shaune Pascal, MD as PCP - Cardiology (Cardiology) Jorge Ny, LCSW as Social Worker  (Licensed Clinical Social Worker) Shamleffer, Melanie Crazier, MD as Consulting Physician (Endocrinology)  Patient Active Problem List   Diagnosis Date Noted   Chronic systolic heart failure ([place]) 05/08/2022   Type 2 diabetes mellitus with stage 3a chronic kidney disease, with long-term current use of insulin ([place]) 02/16/2022   Prosthetic joint implant failure, initial encounter ([place]) 12/20/2021   Pseudophakia of both eyes 11/15/2021   DVT (deep venous thrombosis) ([place]) 07/31/2021   Acute blood loss anemia    Septic Arthritis, Infection of prosthetic right knee joint ([place]), MSSA infection 123XX123   Complication of internal right knee prosthesis ([place]) 07/10/2021   CAD (coronary artery disease) 06/08/2021   Carotid artery disease ([place]) 06/08/2021   Symptomatic bradycardia 06/08/2021   Paroxysmal atrial fibrillation ([place]) 06/08/2021   CKD (chronic kidney disease), stage III ([place]) 06/08/2021   Polyneuropathy associated with underlying disease ([place]) 05/04/2020   Severe nonproliferative diabetic retinopathy of left eye, with macular edema, associated with type 2 diabetes mellitus ([place]) 11/05/2019   Severe nonproliferative diabetic retinopathy of right eye, with macular edema, associated with type 2 diabetes mellitus ([place]) 11/05/2019   Retinal hemorrhage of right eye 11/05/2019   Retinal hemorrhage of left eye 11/05/2019   Diabetes mellitus ([place]) 05/14/2019   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, with long-term current use of insulin ([place]) 05/14/2019   Type 2 diabetes mellitus with diabetic polyneuropathy, with long-term current use of insulin ([place]) 05/14/2019   History of gout 05/20/2018  Primary localized osteoarthritis of right knee 05/13/2018   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) 12/20/2017   Obesity (BMI 30-39.9) 11/11/2017   OSA (obstructive sleep apnea) 02/18/2017   Essential hypertension    L Breast Cancer    Hypothyroidism     Current Outpatient  Medications:    acetaminophen (TYLENOL) 500 MG tablet, Take 500-1,000 mg by mouth every 6 (six) hours as needed (for pain or headaches)., Disp: , Rfl:    allopurinol (ZYLOPRIM) 100 MG tablet, Take 100 mg by mouth daily., Disp: , Rfl:    apixaban (ELIQUIS) 5 MG TABS tablet, Take 1 tablet (5 mg total) by mouth 2 (two) times daily., Disp: 60 tablet, Rfl: 11   aspirin EC 81 MG tablet, Take 1 tablet (81 mg total) by mouth daily. Swallow whole., Disp: 360 tablet, Rfl: 0   atorvastatin (LIPITOR) 80 MG tablet, Take 1 tablet (80 mg total) by mouth at bedtime., Disp: 90 tablet, Rfl: 3   Biotin 10 MG TABS, Take 10 mg by mouth daily., Disp: , Rfl:    Blood Glucose Monitoring Suppl (ONETOUCH VERIO) w/Device KIT, Use to check blood sugar, Disp: 1 kit, Rfl: 0   carvedilol (COREG) 3.125 MG tablet, Take 1 tablet (3.125 mg total) by mouth 2 (two) times daily with a meal., Disp: 60 tablet, Rfl: 6   celecoxib (CELEBREX) 100 MG capsule, Take 100 mg by mouth 2 (two) times daily., Disp: , Rfl:    clotrimazole-betamethasone (LOTRISONE) lotion, Apply 1 application topically 2 (two) times daily as needed (itching)., Disp: , Rfl:    doxycycline (VIBRA-TABS) 100 MG tablet, Take 1 tablet (100 mg total) by mouth 2 (two) times daily for 14 days., Disp: 28 tablet, Rfl: 0   empagliflozin (JARDIANCE) 10 MG TABS tablet, Take 1 tablet (10 mg total) by mouth daily., Disp: 30 tablet, Rfl: 11   Ferrous Sulfate (IRON) 325 (65 Fe) MG TABS, Take 1 tablet (325 mg total) by mouth 2 (two) times daily before a meal., Disp: 180 tablet, Rfl: 3   folic acid (FOLVITE) 1 MG tablet, Take 1 tablet (1 mg total) by mouth daily., Disp: 30 tablet, Rfl: 2   gabapentin (NEURONTIN) 300 MG capsule, Take 300 mg by mouth 2 (two) times daily., Disp: , Rfl:    Insulin Glargine (BASAGLAR KWIKPEN) 100 UNIT/ML, Inject 36 Units into the skin at bedtime., Disp: 45 mL, Rfl: 0   insulin lispro (HUMALOG) 100 UNIT/ML injection, Inject 0.05 mLs (5 Units total) into the  skin 3 (three) times daily before meals., Disp: 10 mL, Rfl: 0   Lancets (ONETOUCH DELICA PLUS 123XX123) MISC, USE   TO CHECK GLUCOSE 4 TIMES DAILY, Disp: 100 each, Rfl: 3   latanoprost (XALATAN) 0.005 % ophthalmic solution, Place 1 drop into both eyes at bedtime., Disp: , Rfl:    levothyroxine (SYNTHROID, LEVOTHROID) 75 MCG tablet, Take 75 mcg by mouth daily before breakfast., Disp: , Rfl:    losartan (COZAAR) 25 MG tablet, Take 1 tablet (25 mg total) by mouth daily., Disp: 30 tablet, Rfl: 6   LUMIGAN 0.01 % SOLN, Place 1 drop into both eyes at bedtime., Disp: , Rfl:    Multiple Vitamins-Minerals (ALIVE ONCE DAILY WOMENS 50+ PO), Take 1 tablet by mouth daily., Disp: , Rfl:    spironolactone (ALDACTONE) 25 MG tablet, Take 0.5 tablets (12.5 mg total) by mouth daily., Disp: 45 tablet, Rfl: 3   torsemide (DEMADEX) 20 MG tablet, Take 1 tablet (20 mg total) by mouth as needed. (Patient  taking differently: Take 20 mg by mouth daily as needed (edema).), Disp: 30 tablet, Rfl: 11   Travoprost, BAK Free, (TRAVATAN) 0.004 % SOLN ophthalmic solution, Place 1 drop into both eyes at bedtime., Disp: , Rfl:    Continuous Blood Gluc Sensor (DEXCOM G6 SENSOR) MISC, 1 Device by Does not apply route as directed., Disp: 9 each, Rfl: 3 Allergies  Allergen Reactions   Codeine Other (See Comments)   Other Other (See Comments)    Blood Product Refusal (Jehovah's witness)     Tape Other (See Comments)    Leaves blisters and marks on the skin   Sulfa Antibiotics Rash   Uloric [Febuxostat] Rash     Social History   Socioeconomic History   Marital status: Widowed    Spouse name: Not on file   Number of children: 3   Years of education: Not on file   Highest education level: Some college, no degree  Occupational History   Occupation: retired  Tobacco Use   Smoking status: Former    Packs/day: 1.00    Years: 22.00    Total pack years: 22.00    Types: Cigarettes    Quit date: 3    Years since quitting:  41.2   Smokeless tobacco: Never  Vaping Use   Vaping Use: Never used  Substance and Sexual Activity   Alcohol use: Yes    Comment: 05/22/2018 "glass of wine couple times/wk"   Drug use: Not Currently   Sexual activity: Not Currently    Birth control/protection: None, Post-menopausal  Other Topics Concern   Not on file  Social History Narrative   Not on file   Social Determinants of Health   Financial Resource Strain: Medium Risk (12/22/2021)   Overall Financial Resource Strain (CARDIA)    Difficulty of Paying Living Expenses: Somewhat hard  Food Insecurity: No Food Insecurity (05/08/2018)   Hunger Vital Sign    Worried About Running Out of Food in the Last Year: Never true    [place] in the Last Year: Never true  Transportation Needs: Unmet Transportation Needs (05/08/2022)   PRAPARE - Hydrologist (Medical): Yes    Lack of Transportation (Non-Medical): Yes  Physical Activity: Not on file  Stress: Not on file  Social Connections: Not on file  Intimate Partner Violence: Not on file    Physical Exam      Future Appointments  Date Time Provider [place]  08/06/2022  9:45 AM [name], DPM [facility]  08/16/2022 10:30 AM Shamleffer, Melanie Crazier, MD [facility] None  08/28/2022  1:00 PM MC-MR 1 [facility]MRI [facility]  09/05/2022 11:30 AM MC-CV HS VASC 5 [facility]  09/05/2022 12:00 PM [facility]-CV HS VASC 5 [facility]  09/05/2022 12:20 PM Waynetta Sandy, MD [facility]

## 2022-08-06 ENCOUNTER — Ambulatory Visit: Payer: Medicare HMO | Admitting: Podiatry

## 2022-08-06 ENCOUNTER — Encounter: Payer: Self-pay | Admitting: Podiatry

## 2022-08-06 DIAGNOSIS — E1151 Type 2 diabetes mellitus with diabetic peripheral angiopathy without gangrene: Secondary | ICD-10-CM

## 2022-08-06 DIAGNOSIS — L97522 Non-pressure chronic ulcer of other part of left foot with fat layer exposed: Secondary | ICD-10-CM

## 2022-08-06 DIAGNOSIS — L97521 Non-pressure chronic ulcer of other part of left foot limited to breakdown of skin: Secondary | ICD-10-CM

## 2022-08-06 NOTE — Progress Notes (Signed)
Subjective:  Patient ID: Danielle [name], female    DOB: [birth year],   MRN: [id number]  Chief Complaint  Patient presents with   Foot Ulcer    77 y.o. female presents for follow-up of left second toe wound. Marland Kitchen Has been dressing as instructed .Still taking doxycycline.  Underwent vascular procedure 3/4.   Denies any other pedal complaints. Denies n/v/f/c.   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[place]) 02/05/2017   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 05/20/2018   Acute kidney failure ([place]) 05/15/2018   Arthritis    "all over" (05/22/2018)   Blood dyscrasia    per pt-has small blood cells-appears as if anemic   Breast cancer, left breast ([place]) 1985   CHF (congestive heart failure) (HCC)    Complication of anesthesia    difficult to awaken from per pt   Coronary artery disease    Dyspnea    Essential hypertension    Heart disease    Heart murmur    History of gout    Hypothyroidism    Obesity (BMI 30-39.9) 11/11/2017   OSA (obstructive sleep apnea) 02/18/2017    severe obstructive sleep apnea with an AHI of 75.6/h and mild central sleep apnea with a CAI of 7.7/h.  Oxygen saturations dropped as low as 82%.   He is on CPAP at 9 cm H2O.   OSA on CPAP    Personal history of chemotherapy    Personal history of radiation therapy    Presence of permanent cardiac pacemaker    Primary localized osteoarthritis of right knee 05/13/2018   Refusal of blood transfusions as patient is Jehovah's Witness    Type II diabetes mellitus ([place])     Objective:  Physical Exam: Vascular: DP/PT pulses 2/4 bilateral. CFT <3 seconds. Absent hair growth on digits. Edema noted to bilateral lower extremities. Xerosis noted bilaterally.  Skin. No lacerations or abrasions bilateral feet. Nails 1-5 bilateral  are thickened discolored and  with subungual debris. Left second digit ulceration about 0.5 cm x 0.5 cm x 0.1 cm with  overyling hyperkeratosis.  Some edema and erythema noted to the digit. No purulence. Does probe to bone today.  Musculoskeletal: MMT 5/5 bilateral lower extremities in DF, PF, Inversion and Eversion. Deceased ROM in DF of ankle joint.  Neurological: Sensation intact to light touch. Protective sensation diminished bilateral.    Assessment:   1. Skin ulcer of second toe of left foot, limited to breakdown of skin ([place])   2. Type II diabetes mellitus with peripheral circulatory disorder [facility])         Plan:  Patient was evaluated and treated and all questions answered. Ulcer left second digit with fat layer exposed.  X-rays left foot no significant difference noted from previous. Possible erosions of the distal phalanx.  -Debridement as below.  -Dressed with betadine, DSD. -Off-loading with surgical shoe. -Doxycycline continue course.  Vascular procedure preformed on 3/4  MRI ordered. Not scheduled unfortunately until 4/2.  WBC 8.2  CRP and ESR normal range.  Culture essentially no growth.  -Did discuss hospitalization with patient and inpatient care for the infection in her bone. Toe currently appears stable and will plan to try and remain outpatient. Toe does appear to be improving. With MRI and outpatient surgical treatment. Did discuss if any worsening in the meantime to head to the hospital.  -Discussed glucose control and proper protein-rich diet.  -Discussed if any  worsening redness, pain, fever or chills to call or may need to report to the emergency room. Patient expressed understanding.     Procedure: Excisional Debridement of Wound Rationale: Removal of non-viable soft tissue from the wound to promote healing.  Anesthesia: none Pre-Debridement Wound Measurements: Overlying callus  Post-Debridement Wound Measurements: 0.5 cm x 0.5 cm x 0.1 cm  Type of Debridement: Sharp Excisional Tissue Removed: Non-viable soft tissue Depth of Debridement: subcutaneous  tissue. Technique: Sharp excisional debridement to bleeding, viable wound base.  Dressing: Dry, sterile, compression dressing. Disposition: Patient tolerated procedure well. Patient to return in 2 week for follow-up.  Return in about 2 weeks (around 08/20/2022) for wound check.    Return in about 2 weeks (around 08/20/2022) for wound check.   [name], DPM

## 2022-08-09 ENCOUNTER — Other Ambulatory Visit (HOSPITAL_COMMUNITY): Payer: Self-pay

## 2022-08-09 ENCOUNTER — Telehealth (HOSPITAL_COMMUNITY): Payer: Self-pay

## 2022-08-09 DIAGNOSIS — E1165 Type 2 diabetes mellitus with hyperglycemia: Secondary | ICD-10-CM | POA: Diagnosis not present

## 2022-08-09 NOTE — Telephone Encounter (Signed)
[facility] pharmacy needing refills for Carvedilol for Mrs. [name]. Sending request to HF triage. Call complete.   [name], [place] 08/09/2022

## 2022-08-09 NOTE — Telephone Encounter (Signed)
Heather from paramedicine called and stated patient is up 5 lbs from last week, 3+ bilateral edema, shortness of  breath, not peeing as much. Please advise on medication changes.

## 2022-08-09 NOTE — Progress Notes (Signed)
Paramedicine Encounter    Patient ID: Danielle [name], female    DOB: 1945/08/08, 77 y.o.   MRN: [id number]   Complaints- leg swelling, 5 lbs weight gain in one week, elevated sugar readings, decreased urination, some shortness of breath on exertion.   Assessment- CAOX4, warm and dry ambulating with some shortness of breath but not severe, BLE (pitting), lungs clear, 5 lbs weight gain, CBG- 232 (insulin last taken at 1000 this morning), reports decreased urination over the last few days.   Compliance with meds- no missed doses of medications   Pill box filled- for one week.   Refills needed- Carvedilol, Celebrex.   Meds changes since last visit- TORSEMIDE INCREASED TO '40MG'$  daily for three days per [facility] as of 1500 today. I ensured pill box reflected same.     Social changes- none    BP 110/70   Pulse 74   Resp 18   Wt 210 lb (95.3 kg)   SpO2 96%   BMI 36.05 kg/m  Weight yesterday-- 211lbs Last visit weight-205lbs   Arrived for home visit for [facility] who reports that she has had 5 lbs weight gain in one week, swelling both lower legs, some mild shortness of breath on exertion, decreased urination and trouble sleeping over the last few days. She has missed no doses of her medication. I obtained vitals and assessment as noted. I contacted HF triage and then spoke to [facility] who increased Torsemide for 3 days to '40mg'$  then back to '20mg'$  daily. Pill box filled accordingly. I plan to follow up in the home on Monday. She agreed with plan.   Blood sugars have been increased over the last week-Bryley reports she is not eating much sweets, she is drinking flavored seltzer water, waters and some cranberry juice. She denied any pain or possible infection anywhere. No redness, pus leaking sites or fevers. She has been taking insulin regularly. She will be seeing Endocrinologist next week for follow up. I traded out her dexcom sensor today.   We reviewed upcoming appointments and I wrote down  same for her.  Home visit complete.    [name], [place]  ACTION: Home visit completed    Patient Care Team: Seward Carol, MD as PCP - General (Internal Medicine) Constance Haw, MD as PCP - Electrophysiology (Cardiology) Sueanne Margarita, MD as PCP - Sleep Medicine (Cardiology) Bensimhon, Shaune Pascal, MD as PCP - Cardiology (Cardiology) Jorge Ny, LCSW as Social Worker (Licensed Clinical Social Worker) Shamleffer, Melanie Crazier, MD as Consulting Physician (Endocrinology)  Patient Active Problem List   Diagnosis Date Noted   Chronic systolic heart failure ([place]) 05/08/2022   Type 2 diabetes mellitus with stage 3a chronic kidney disease, with long-term current use of insulin ([place]) 02/16/2022   Prosthetic joint implant failure, initial encounter ([place]) 12/20/2021   Pseudophakia of both eyes 11/15/2021   DVT (deep venous thrombosis) ([place]) 07/31/2021   Acute blood loss anemia    Septic Arthritis, Infection of prosthetic right knee joint ([place]), MSSA infection [id number]   Complication of internal right knee prosthesis ([place]) 07/10/2021   CAD (coronary artery disease) 06/08/2021   Carotid artery disease ([place]) 06/08/2021   Symptomatic bradycardia 06/08/2021   Paroxysmal atrial fibrillation ([place]) 06/08/2021   CKD (chronic kidney disease), stage III ([place]) 06/08/2021   Polyneuropathy associated with underlying disease ([place]) 05/04/2020   Severe nonproliferative diabetic retinopathy of left eye, with macular edema, associated with type 2 diabetes mellitus ([place]) 11/05/2019   Severe nonproliferative  diabetic retinopathy of right eye, with macular edema, associated with type 2 diabetes mellitus ([place]) 11/05/2019   Retinal hemorrhage of right eye 11/05/2019   Retinal hemorrhage of left eye 11/05/2019   Diabetes mellitus ([place]) 05/14/2019   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, with long-term current use of insulin ([place]) 05/14/2019   Type 2  diabetes mellitus with diabetic polyneuropathy, with long-term current use of insulin ([place]) 05/14/2019   History of gout 05/20/2018   Primary localized osteoarthritis of right knee 05/13/2018   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) 12/20/2017   Obesity (BMI 30-39.9) 11/11/2017   OSA (obstructive sleep apnea) 02/18/2017   Essential hypertension    L Breast Cancer    Hypothyroidism     Current Outpatient Medications:    acetaminophen (TYLENOL) 500 MG tablet, Take 500-1,000 mg by mouth every 6 (six) hours as needed (for pain or headaches)., Disp: , Rfl:    allopurinol (ZYLOPRIM) 100 MG tablet, Take 100 mg by mouth daily., Disp: , Rfl:    apixaban (ELIQUIS) 5 MG TABS tablet, Take 1 tablet (5 mg total) by mouth 2 (two) times daily., Disp: 60 tablet, Rfl: 11   aspirin EC 81 MG tablet, Take 1 tablet (81 mg total) by mouth daily. Swallow whole., Disp: 360 tablet, Rfl: 0   atorvastatin (LIPITOR) 80 MG tablet, Take 1 tablet (80 mg total) by mouth at bedtime., Disp: 90 tablet, Rfl: 3   Biotin 10 MG TABS, Take 10 mg by mouth daily., Disp: , Rfl:    Blood Glucose Monitoring Suppl (ONETOUCH VERIO) w/Device KIT, Use to check blood sugar, Disp: 1 kit, Rfl: 0   carvedilol (COREG) 3.125 MG tablet, TAKE 1 TABLET BY MOUTH TWICE DAILY WITH A MEAL, Disp: 60 tablet, Rfl: 0   celecoxib (CELEBREX) 100 MG capsule, Take 100 mg by mouth 2 (two) times daily., Disp: , Rfl:    clotrimazole-betamethasone (LOTRISONE) lotion, Apply 1 application topically 2 (two) times daily as needed (itching)., Disp: , Rfl:    Continuous Blood Gluc Sensor (DEXCOM G6 SENSOR) MISC, 1 Device by Does not apply route as directed., Disp: 9 each, Rfl: 3   empagliflozin (JARDIANCE) 10 MG TABS tablet, Take 1 tablet (10 mg total) by mouth daily., Disp: 30 tablet, Rfl: 11   Ferrous Sulfate (IRON) 325 (65 Fe) MG TABS, Take 1 tablet (325 mg total) by mouth 2 (two) times daily before a meal., Disp: 180 tablet, Rfl: 3   folic acid (FOLVITE) 1 MG  tablet, Take 1 tablet (1 mg total) by mouth daily., Disp: 30 tablet, Rfl: 2   gabapentin (NEURONTIN) 300 MG capsule, Take 300 mg by mouth 2 (two) times daily., Disp: , Rfl:    Insulin Glargine (BASAGLAR KWIKPEN) 100 UNIT/ML, Inject 36 Units into the skin at bedtime., Disp: 45 mL, Rfl: 0   insulin lispro (HUMALOG) 100 UNIT/ML injection, Inject 0.05 mLs (5 Units total) into the skin 3 (three) times daily before meals., Disp: 10 mL, Rfl: 0   Lancets (ONETOUCH DELICA PLUS 123XX123) MISC, USE   TO CHECK GLUCOSE 4 TIMES DAILY, Disp: 100 each, Rfl: 3   latanoprost (XALATAN) 0.005 % ophthalmic solution, Place 1 drop into both eyes at bedtime., Disp: , Rfl:    levothyroxine (SYNTHROID, LEVOTHROID) 75 MCG tablet, Take 75 mcg by mouth daily before breakfast., Disp: , Rfl:    losartan (COZAAR) 25 MG tablet, Take 1 tablet (25 mg total) by mouth daily., Disp: 30 tablet, Rfl: 6   LUMIGAN 0.01 %  SOLN, Place 1 drop into both eyes at bedtime., Disp: , Rfl:    Multiple Vitamins-Minerals (ALIVE ONCE DAILY WOMENS 50+ PO), Take 1 tablet by mouth daily., Disp: , Rfl:    spironolactone (ALDACTONE) 25 MG tablet, Take 0.5 tablets (12.5 mg total) by mouth daily., Disp: 45 tablet, Rfl: 3   torsemide (DEMADEX) 20 MG tablet, Take 1 tablet (20 mg total) by mouth as needed. (Patient taking differently: Take 20 mg by mouth daily.), Disp: 30 tablet, Rfl: 11   Travoprost, BAK Free, (TRAVATAN) 0.004 % SOLN ophthalmic solution, Place 1 drop into both eyes at bedtime., Disp: , Rfl:  Allergies  Allergen Reactions   Codeine Other (See Comments)   Other Other (See Comments)    Blood Product Refusal (Jehovah's witness)     Tape Other (See Comments)    Leaves blisters and marks on the skin   Sulfa Antibiotics Rash   Uloric [Febuxostat] Rash     Social History   Socioeconomic History   Marital status: Widowed    Spouse name: Not on file   Number of children: 3   Years of education: Not on file   Highest education level: Some  college, no degree  Occupational History   Occupation: retired  Tobacco Use   Smoking status: Former    Packs/day: 1.00    Years: 22.00    Additional pack years: 0.00    Total pack years: 22.00    Types: Cigarettes    Quit date: 70    Years since quitting: 41.2   Smokeless tobacco: Never  Vaping Use   Vaping Use: Never used  Substance and Sexual Activity   Alcohol use: Yes    Comment: 05/22/2018 "glass of wine couple times/wk"   Drug use: Not Currently   Sexual activity: Not Currently    Birth control/protection: None, Post-menopausal  Other Topics Concern   Not on file  Social History Narrative   Not on file   Social Determinants of Health   Financial Resource Strain: Medium Risk (12/22/2021)   Overall Financial Resource Strain (CARDIA)    Difficulty of Paying Living Expenses: Somewhat hard  Food Insecurity: No Food Insecurity (05/08/2018)   Hunger Vital Sign    Worried About Running Out of Food in the Last Year: Never true    [place] in the Last Year: Never true  Transportation Needs: Unmet Transportation Needs (05/08/2022)   PRAPARE - Hydrologist (Medical): Yes    Lack of Transportation (Non-Medical): Yes  Physical Activity: Not on file  Stress: Not on file  Social Connections: Not on file  Intimate Partner Violence: Not on file    Physical Exam      Future Appointments  Date Time Provider [place]  08/16/2022 10:30 AM Shamleffer, Melanie Crazier, MD [facility] None  08/20/2022  9:45 AM [name], DPM [facility]  08/28/2022  1:00 PM [facility]MR 1 [facility]  09/05/2022 11:30 AM MC-CV HS VASC 5 [facility]  09/05/2022 12:00 PM MC-CV HS VASC 5 [facility]  09/05/2022 12:20 PM Waynetta Sandy, MD [facility]

## 2022-08-09 NOTE — Telephone Encounter (Signed)
    Return call to [facility] HF Paramedicine.   Increase  torsemide 40 mg  daily x 3 days then go back to 20 mg  daily.    Asked HF Paramedicine to visit Monday and reassess.   Demareon Coldwell NP-C  2:53 PM  .

## 2022-08-10 ENCOUNTER — Other Ambulatory Visit (HOSPITAL_COMMUNITY): Payer: Self-pay

## 2022-08-10 MED ORDER — CARVEDILOL 3.125 MG PO TABS: 3.1250 mg | ORAL_TABLET | Freq: Two times a day (BID) | ORAL | 0 refills | Status: DC

## 2022-08-16 ENCOUNTER — Telehealth (HOSPITAL_COMMUNITY): Payer: Self-pay

## 2022-08-16 ENCOUNTER — Other Ambulatory Visit (HOSPITAL_COMMUNITY): Payer: Self-pay

## 2022-08-16 ENCOUNTER — Encounter: Payer: Self-pay | Admitting: Internal Medicine

## 2022-08-16 ENCOUNTER — Ambulatory Visit: Payer: Medicare HMO | Admitting: Internal Medicine

## 2022-08-16 VITALS — BP 124/72 | HR 84 | Ht 64.0 in | Wt 210.0 lb

## 2022-08-16 DIAGNOSIS — E1165 Type 2 diabetes mellitus with hyperglycemia: Secondary | ICD-10-CM

## 2022-08-16 DIAGNOSIS — Z794 Long term (current) use of insulin: Secondary | ICD-10-CM

## 2022-08-16 DIAGNOSIS — E1142 Type 2 diabetes mellitus with diabetic polyneuropathy: Secondary | ICD-10-CM | POA: Diagnosis not present

## 2022-08-16 DIAGNOSIS — E113599 Type 2 diabetes mellitus with proliferative diabetic retinopathy without macular edema, unspecified eye: Secondary | ICD-10-CM | POA: Diagnosis not present

## 2022-08-16 DIAGNOSIS — E1159 Type 2 diabetes mellitus with other circulatory complications: Secondary | ICD-10-CM | POA: Diagnosis not present

## 2022-08-16 DIAGNOSIS — E1122 Type 2 diabetes mellitus with diabetic chronic kidney disease: Secondary | ICD-10-CM

## 2022-08-16 DIAGNOSIS — N1831 Chronic kidney disease, stage 3a: Secondary | ICD-10-CM | POA: Diagnosis not present

## 2022-08-16 LAB — POCT GLYCOSYLATED HEMOGLOBIN (HGB A1C): Hemoglobin A1C: 8.6 % — AB (ref 4.0–5.6)

## 2022-08-16 MED ORDER — TORSEMIDE 20 MG PO TABS: ORAL_TABLET | ORAL | 3 refills | Status: DC

## 2022-08-16 NOTE — Telephone Encounter (Signed)
Danielle [name] is requesting her labs be drawn on Monday at 1:00pm as ordered by B. Simmons on 08/16/22 after increasing her Torsemide today.  I spoke to her on the phone and advised since it's after 5:00 today I will send a message to the HF clinic and have them see if this is doable and confirm with her via phone.   [name], [place] 08/16/2022

## 2022-08-16 NOTE — Telephone Encounter (Signed)
Agree w/ plan below. We will arrange f/u labs next wk. Will need BMP and BNP.

## 2022-08-16 NOTE — Progress Notes (Signed)
Paramedicine Encounter    Patient ID: Danielle [name], female    DOB: April 20, 1946, 77 y.o.   MRN: [id number]   Complaints- decreased urination, bilateral lower leg swelling, no weight loss over last week.   Assessment- alert and oriented, bilateral lower leg edema left and right, lungs clear, vitals stable at earlier appointment. Decreased urination, constipated.   Compliance with meds- no missed doses.   Pill box filled- for one week   Refills needed- torsemide, celebrex, carvedilol   Meds changes since last visit- today- torsemide increased to 60mg  daily per B. Simmons after discussion with her in HF clinic. Insulin also increased to 6 units with meals daily.     Social changes- none    There were no vitals taken for this visit. Weight yesterday-211lbs Last visit weight-210lbs  Met with [name] today in the home who reports to be feeling okay but states she has had concerns with decreased urination, lower leg swellings in both lower legs with some edema. She was seen by endocrinology today and vitals were obtained there and were within normal limits aside from her weight- her weight is the same. I reviewed her medications and she completed the increase of Torsemide by placing same in her pill box. Message sent to triage for lab scheduling. I will follow up. Home visit complete.    Danielle [name], [place]  ACTION: Home visit completed    Patient Care Team: Seward Carol, MD as PCP - General (Internal Medicine) Constance Haw, MD as PCP - Electrophysiology (Cardiology) Sueanne Margarita, MD as PCP - Sleep Medicine (Cardiology) Bensimhon, Shaune Pascal, MD as PCP - Cardiology (Cardiology) Jorge Ny, LCSW as Social Worker (Licensed Clinical Social Worker) Shamleffer, Melanie Crazier, MD as Consulting Physician (Endocrinology)  Patient Active Problem List   Diagnosis Date Noted   Chronic systolic heart failure ([place]) 05/08/2022   Type 2 diabetes mellitus  with stage 3a chronic kidney disease, with long-term current use of insulin ([place]) 02/16/2022   Prosthetic joint implant failure, initial encounter ([place]) 12/20/2021   Pseudophakia of both eyes 11/15/2021   DVT (deep venous thrombosis) ([place]) 07/31/2021   Acute blood loss anemia    Septic Arthritis, Infection of prosthetic right knee joint ([place]), MSSA infection [id number]   Complication of internal right knee prosthesis ([place]) 07/10/2021   CAD (coronary artery disease) 06/08/2021   Carotid artery disease ([place]) 06/08/2021   Symptomatic bradycardia 06/08/2021   Paroxysmal atrial fibrillation ([place]) 06/08/2021   CKD (chronic kidney disease), stage III ([place]) 06/08/2021   Polyneuropathy associated with underlying disease ([place]) 05/04/2020   Severe nonproliferative diabetic retinopathy of left eye, with macular edema, associated with type 2 diabetes mellitus ([place]) 11/05/2019   Severe nonproliferative diabetic retinopathy of right eye, with macular edema, associated with type 2 diabetes mellitus ([place]) 11/05/2019   Retinal hemorrhage of right eye 11/05/2019   Retinal hemorrhage of left eye 11/05/2019   Diabetes mellitus ([place]) 05/14/2019   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, with long-term current use of insulin ([place]) 05/14/2019   Type 2 diabetes mellitus with diabetic polyneuropathy, with long-term current use of insulin ([place]) 05/14/2019   History of gout 05/20/2018   Primary localized osteoarthritis of right knee 05/13/2018   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) 12/20/2017   Obesity (BMI 30-39.9) 11/11/2017   OSA (obstructive sleep apnea) 02/18/2017   Essential hypertension    L Breast Cancer    Hypothyroidism     Current Outpatient Medications:    acetaminophen (  TYLENOL) 500 MG tablet, Take 500-1,000 mg by mouth every 6 (six) hours as needed (for pain or headaches)., Disp: , Rfl:    allopurinol (ZYLOPRIM) 100 MG tablet, Take 100 mg by mouth daily., Disp: , Rfl:     apixaban (ELIQUIS) 5 MG TABS tablet, Take 1 tablet (5 mg total) by mouth 2 (two) times daily., Disp: 60 tablet, Rfl: 11   aspirin EC 81 MG tablet, Take 1 tablet (81 mg total) by mouth daily. Swallow whole., Disp: 360 tablet, Rfl: 0   atorvastatin (LIPITOR) 80 MG tablet, Take 1 tablet (80 mg total) by mouth at bedtime., Disp: 90 tablet, Rfl: 3   Biotin 10 MG TABS, Take 10 mg by mouth daily., Disp: , Rfl:    Blood Glucose Monitoring Suppl (ONETOUCH VERIO) w/Device KIT, Use to check blood sugar, Disp: 1 kit, Rfl: 0   carvedilol (COREG) 3.125 MG tablet, Take 1 tablet (3.125 mg total) by mouth 2 (two) times daily with a meal., Disp: 60 tablet, Rfl: 0   celecoxib (CELEBREX) 100 MG capsule, Take 100 mg by mouth 2 (two) times daily., Disp: , Rfl:    clotrimazole-betamethasone (LOTRISONE) lotion, Apply 1 application topically 2 (two) times daily as needed (itching)., Disp: , Rfl:    Continuous Blood Gluc Sensor (DEXCOM G6 SENSOR) MISC, 1 Device by Does not apply route as directed., Disp: 9 each, Rfl: 3   empagliflozin (JARDIANCE) 10 MG TABS tablet, Take 1 tablet (10 mg total) by mouth daily., Disp: 30 tablet, Rfl: 11   Ferrous Sulfate (IRON) 325 (65 Fe) MG TABS, Take 1 tablet (325 mg total) by mouth 2 (two) times daily before a meal., Disp: 180 tablet, Rfl: 3   folic acid (FOLVITE) 1 MG tablet, Take 1 tablet (1 mg total) by mouth daily., Disp: 30 tablet, Rfl: 2   gabapentin (NEURONTIN) 300 MG capsule, Take 300 mg by mouth 2 (two) times daily., Disp: , Rfl:    Insulin Glargine (BASAGLAR KWIKPEN) 100 UNIT/ML, Inject 36 Units into the skin at bedtime., Disp: 45 mL, Rfl: 0   insulin lispro (HUMALOG) 100 UNIT/ML injection, Inject 0.05 mLs (5 Units total) into the skin 3 (three) times daily before meals., Disp: 10 mL, Rfl: 0   Lancets (ONETOUCH DELICA PLUS 123XX123) MISC, USE   TO CHECK GLUCOSE 4 TIMES DAILY, Disp: 100 each, Rfl: 3   latanoprost (XALATAN) 0.005 % ophthalmic solution, Place 1 drop into both eyes at  bedtime., Disp: , Rfl:    levothyroxine (SYNTHROID, LEVOTHROID) 75 MCG tablet, Take 75 mcg by mouth daily before breakfast., Disp: , Rfl:    losartan (COZAAR) 25 MG tablet, Take 1 tablet (25 mg total) by mouth daily., Disp: 30 tablet, Rfl: 6   LUMIGAN 0.01 % SOLN, Place 1 drop into both eyes at bedtime., Disp: , Rfl:    Multiple Vitamins-Minerals (ALIVE ONCE DAILY WOMENS 50+ PO), Take 1 tablet by mouth daily., Disp: , Rfl:    spironolactone (ALDACTONE) 25 MG tablet, Take 0.5 tablets (12.5 mg total) by mouth daily., Disp: 45 tablet, Rfl: 3   torsemide (DEMADEX) 20 MG tablet, Take 3 tablets by mouth daily, Disp: 90 tablet, Rfl: 3   Travoprost, BAK Free, (TRAVATAN) 0.004 % SOLN ophthalmic solution, Place 1 drop into both eyes at bedtime., Disp: , Rfl:  Allergies  Allergen Reactions   Codeine Other (See Comments)   Other Other (See Comments)    Blood Product Refusal (Jehovah's witness)     Tape Other (See Comments)  Leaves blisters and marks on the skin   Sulfa Antibiotics Rash   Uloric [Febuxostat] Rash     Social History   Socioeconomic History   Marital status: Widowed    Spouse name: Not on file   Number of children: 3   Years of education: Not on file   Highest education level: Some college, no degree  Occupational History   Occupation: retired  Tobacco Use   Smoking status: Former    Packs/day: 1.00    Years: 22.00    Additional pack years: 0.00    Total pack years: 22.00    Types: Cigarettes    Quit date: 24    Years since quitting: 41.2   Smokeless tobacco: Never  Vaping Use   Vaping Use: Never used  Substance and Sexual Activity   Alcohol use: Yes    Comment: 05/22/2018 "glass of wine couple times/wk"   Drug use: Not Currently   Sexual activity: Not Currently    Birth control/protection: None, Post-menopausal  Other Topics Concern   Not on file  Social History Narrative   Not on file   Social Determinants of Health   Financial Resource Strain: Medium  Risk (12/22/2021)   Overall Financial Resource Strain (CARDIA)    Difficulty of Paying Living Expenses: Somewhat hard  Food Insecurity: No Food Insecurity (05/08/2018)   Hunger Vital Sign    Worried About Running Out of Food in the Last Year: Never true    [place] in the Last Year: Never true  Transportation Needs: Unmet Transportation Needs (05/08/2022)   PRAPARE - Hydrologist (Medical): Yes    Lack of Transportation (Non-Medical): Yes  Physical Activity: Not on file  Stress: Not on file  Social Connections: Not on file  Intimate Partner Violence: Not on file    Physical Exam      Future Appointments  Date Time Provider [place]  08/20/2022  9:45 AM [name], DPM [facility]  08/28/2022  1:00 PM MC-MR 1 MC-MRI [facility]  09/05/2022 11:30 AM MC-CV HS VASC 5 [facility]  09/05/2022 12:00 PM [facility]-CV HS VASC 5 [facility]VS  09/05/2022 12:20 PM Waynetta Sandy, MD [facility]  11/01/2022  7:00 AM [facility] DEVICE REMOTES [facility]  01/31/2023  7:00 AM [facility] DEVICE REMOTES [facility]  02/22/2023 11:30 AM Shamleffer, Melanie Crazier, MD [facility] None  05/02/2023  7:00 AM [facility] DEVICE REMOTES [facility]  08/01/2023  7:00 AM [facility] DEVICE REMOTES [facility]  10/31/2023  7:00 AM [facility] DEVICE REMOTES [facility]  01/30/2024  7:00 AM [facility] DEVICE REMOTES [facility]  04/30/2024  7:00 AM [facility] DEVICE REMOTES [facility]

## 2022-08-16 NOTE — Progress Notes (Signed)
Name: Danielle [name]  Age/ Sex: 77 y.o., female   MRN/ DOB: [id number], [birth year]     PCP: Seward Carol, MD   Reason for Endocrinology Evaluation: Type 2 Diabetes Mellitus  Initial Endocrine Consultative Visit: 05/14/2019    PATIENT IDENTIFIER: Danielle [name] is a 77 y.o. female with a past medical history of T2DM, HTN, Dyslipidemia, CAD, Hx of Breast Ca (S/P chemo and lumpectomy)  . The patient has followed with Endocrinology clinic since 05/14/2019 for consultative assistance with management of her diabetes.  DIABETIC HISTORY:  Ms. Tinsley was diagnosed with T2DM > 20 yrs ago. Has been on  Metformin in the past which caused diarrhea, has been on prandial insulin in the past  . Her hemoglobin A1c has ranged from 6.9% in 2019, peaking at 8.0% in 2020.  On her initial visit to our clinic her A1c was 11.2% . She was on Jardiance and Lantus 130 units daily. I reduced her lantus to 80 units, added prandial insulin.   Jardriance initiated  By cardiology 09/2019  SUBJECTIVE:   During the last visit (02/15/2022): A1c 6.1 %.    Today (08/16/2022): [name] is here for a follow up on diabetes management.  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  (S/P laser)  Last eye exam: Completed 07/06/2020    Macrovascular complications:  CAD ( S/P MI and stent placement), PVD Denies: CVA     HISTORY:  Past Medical History:   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[place]) 02/05/2017   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 05/20/2018   Acute kidney failure ([place]) 05/15/2018   Arthritis    "all over" (05/22/2018)   Blood dyscrasia    per pt-has small blood cells-appears as if anemic   Breast cancer, left breast ([place]) 1985   CHF (congestive heart failure) (H[facility])    Complication of anesthesia    difficult to awaken from per pt   Coronary artery disease    Dyspnea    Essential hypertension    Heart disease    Heart murmur    History of gout    Hypothyroidism    Obesity (BMI 30-39.9) 11/11/2017   OSA (obstructive sleep apnea) 02/18/2017    severe obstructive sleep apnea with an AHI of 75.6/h and mild central sleep apnea with a CAI of 7.7/h.  Oxygen saturations dropped as low as 82%.   He is on CPAP at 9 cm H2O.   OSA on CPAP    Personal history of chemotherapy    Personal history of radiation therapy    Presence of permanent cardiac pacemaker    Primary localized osteoarthritis of right knee 05/13/2018   Refusal of blood transfusions as patient is Jehovah's Witness    Type II diabetes mellitus ([place])    Past Surgical History:  Past Surgical History:  Procedure Laterality Date   ABDOMINAL AORTOGRAM W/LOWER EXTREMITY N/A 07/30/2022   Procedure: ABDOMINAL AORTOGRAM W/LOWER EXTREMITY;  Surgeon: Waynetta Sandy, MD;  Location: [facility];  Service: Cardiovascular;  Laterality: N/A;   APPLICATION OF WOUND VAC Right 04/14/2021   Procedure: APPLICATION OF WOUND VAC;  Surgeon: Willaim Sheng, MD;  Location: [facility];  Service: Orthopedics;  Laterality: Right;   BREAST BIOPSY Left 1985   BREAST LUMPECTOMY Left 1985   BREAST LUMPECTOMY WITH NEEDLE LOCALIZATION AND AXILLARY LYMPH NODE DISSECTION  1985   CATARACT EXTRACTION W/ INTRAOCULAR LENS  IMPLANT, BILATERAL Bilateral    CORONARY ANGIOPLASTY WITH STENT PLACEMENT   2010, 2012,2017    2 done 2010 and 1 replaced 2012 and 2 replaced in 2017   Danielle [name]     EXCISIONAL TOTAL KNEE ARTHROPLASTY WITH ANTIBIOTIC SPACERS Right 07/12/2021   Procedure: EXCISIONAL TOTAL KNEE ARTHROPLASTY WITH ANTIBIOTIC SPACERS;  Surgeon: Willaim Sheng, MD;  Location: [facility];  Service: Orthopedics;  Laterality: Right;   I & D KNEE WITH POLY EXCHANGE Right 04/14/2021   Procedure: IRRIGATION AND DEBRIDEMENT KNEE WITH POLY EXCHANGE;  Surgeon: Willaim Sheng, MD;  Location: [facility];  Service: Orthopedics;  Laterality: Right;   INSERT / REPLACE / REMOVE PACEMAKER  07/19/2014   JOINT REPLACEMENT     PERIPHERAL VASCULAR ATHERECTOMY  07/30/2022   Procedure: PERIPHERAL VASCULAR ATHERECTOMY;  Surgeon: Waynetta Sandy, MD;  Location: [facility];  Service: Cardiovascular;;   PERIPHERAL VASCULAR INTERVENTION  07/30/2022   Procedure: PERIPHERAL VASCULAR INTERVENTION;  Surgeon: Waynetta Sandy, MD;  Location: [facility];  Service: Cardiovascular;;   REPLACEMENT TOTAL KNEE Left 09/2012   THYROIDECTOMY, PARTIAL  1978   TONSILLECTOMY     TOTAL KNEE ARTHROPLASTY Right 05/13/2018   Procedure: TOTAL KNEE ARTHROPLASTY;  Surgeon: Marchia Bond, MD;  [facility]: [facility];  Service: Orthopedics;  Laterality: Right;   TOTAL KNEE REVISION Right 12/20/2021   Procedure: REMOVAL OF RIGHT KNEE ANTIBIOTIC SPACER AND REVISION RIGHT KNEE TOTAL ARTHROPLASTY;  Surgeon: Willaim Sheng, MD;  Location: [facility];  Service: Orthopedics;  Laterality: Right;   TUBAL LIGATION     Social History:  reports that she quit smoking about 41 years ago. Her smoking use included cigarettes. She has a 22.00 pack-year smoking history. She has never used smokeless tobacco. She reports current alcohol use. She reports that she does not currently use drugs. Family History:  Family History  Problem Relation Age of Onset   Multiple myeloma Mother    Heart disease Father     Other Sister    Heart disease Brother    Diabetes Sister    Other Sister      HOME MEDICATIONS: Allergies as of 08/16/2022       Reactions   Codeine Other (See Comments)   Other Other (See Comments)   Blood Product Refusal (Jehovah's witness)   Tape Other (See Comments)   Leaves blisters and marks on the skin   Sulfa Antibiotics Rash   Uloric [febuxostat] Rash        Medication List        Accurate as of August 16, 2022 10:29 AM. If you have any questions, ask your nurse or doctor.          acetaminophen 500 MG tablet Commonly known as: TYLENOL Take 500-1,000 mg by mouth every 6 (six) hours as needed (for pain or headaches).   [place]  50+ PO Take 1 tablet by mouth daily.   allopurinol 100 MG tablet Commonly known as: ZYLOPRIM Take 100 mg by mouth daily.   apixaban 5 MG Tabs tablet Commonly known as: Eliquis Take 1 tablet (5 mg total) by mouth 2 (two) times daily.   aspirin EC 81 MG tablet Take 1 tablet (81 mg total) by mouth daily. Swallow whole.   atorvastatin 80 MG tablet Commonly known as: LIPITOR Take 1 tablet (80 mg total) by mouth at bedtime.   Basaglar KwikPen 100 UNIT/ML Inject 36 Units into the skin at bedtime.   Biotin 10 MG Tabs Take 10 mg by mouth daily.   carvedilol 3.125 MG tablet Commonly known as: COREG Take 1 tablet (3.125 mg total) by mouth 2 (two) times daily with a meal.   celecoxib 100 MG capsule Commonly known as: CELEBREX Take 100 mg by mouth 2 (two) times daily.   clotrimazole-betamethasone lotion Commonly known as: LOTRISONE Apply 1 application topically 2 (two) times daily as needed (itching).   Dexcom G6 Sensor Misc 1 Device by Does not apply route as directed.   empagliflozin 10 MG Tabs tablet Commonly known as: Jardiance Take 1 tablet (10 mg total) by mouth daily.   folic acid 1 MG tablet Commonly known as: FOLVITE Take 1 tablet (1 mg total) by mouth daily.   gabapentin 300 MG  capsule Commonly known as: NEURONTIN Take 300 mg by mouth 2 (two) times daily.   insulin lispro 100 UNIT/ML injection Commonly known as: HUMALOG Inject 0.05 mLs (5 Units total) into the skin 3 (three) times daily before meals.   Iron 325 (65 Fe) MG Tabs Take 1 tablet (325 mg total) by mouth 2 (two) times daily before a meal.   latanoprost 0.005 % ophthalmic solution Commonly known as: XALATAN Place 1 drop into both eyes at bedtime.   levothyroxine 75 MCG tablet Commonly known as: SYNTHROID Take 75 mcg by mouth daily before breakfast.   losartan 25 MG tablet Commonly known as: COZAAR Take 1 tablet (25 mg total) by mouth daily.   Lumigan 0.01 % Soln Generic drug: bimatoprost Place 1 drop into both eyes at bedtime.   OneTouch Delica Plus 0000000 Misc USE   TO CHECK GLUCOSE 4 TIMES DAILY   OneTouch Verio w/Device Kit Use to check blood sugar   spironolactone 25 MG tablet Commonly known as: ALDACTONE Take 0.5 tablets (12.5 mg total) by mouth daily.   torsemide 20 MG tablet Commonly known as: DEMADEX Take 1 tablet (20 mg total) by mouth as needed. What changed: when to take this   Travoprost (BAK Free) 0.004 % Soln ophthalmic solution Commonly known as: TRAVATAN Place 1 drop into both eyes at bedtime.         OBJECTIVE:   Vital Signs: BP 124/72 (BP Location: Right Arm, Patient Position: Sitting, Cuff Size: Large)   Pulse 84   Ht 5\' 4"  (1.626 m)   Wt 210 lb (95.3 kg)   SpO2 95%   BMI 36.05 kg/m   Wt Readings from Last 3 Encounters:  08/16/22 210 lb (95.3 kg)  08/09/22 210 lb (95.3 kg)  08/02/22 205 lb (93 kg)     EXAM: General: Pt appears well and is in NAD Bilateral cerumen impaction noted  Heart: RRR , + systolic murmur  Lungs: CTA  Extremities:  BL LE: 1+  pretibial edema   Mental Status: Judgment, insight: intact Orientation: oriented to time, place, and person Mood and affect: no depression, anxiety, or  agitation   DM Foot Exam per  podiatry 07/23/2022     DATA REVIEWED:   Lab Results  Component Value Date   HGBA1C 6.0 (A) 02/15/2022   HGBA1C 7.7 (H) 12/11/2021   HGBA1C 8.2 (H) 07/10/2021    Latest Reference Range & Units 07/30/22 07:33  Sodium 135 - 145 mmol/L 141  Potassium 3.5 - 5.1 mmol/L 4.4  Chloride 98 - 111 mmol/L 107  Glucose 70 - 99 mg/dL 226 (H)  BUN 8 - 23 mg/dL 34 (H)  Creatinine 0.44 - 1.00 mg/dL 1.50 (H)  Calcium Ionized 1.15 - 1.40 mmol/L 1.19  (H): Data is abnormally high    ASSESSMENT / PLAN / RECOMMENDATIONS:   1) Type 2 Diabetes Mellitus, Neuropathic, CKD III and Macrovascular  complications - Most recent A1c of 8.6%. Goal A1c < 7.5 %.    -Her A1c has increased from 6.1% to 8.6% -We were unable to download her Dexcom today, we were also unable to look and review the app manually as she did not have her password -She continues to use more basal insulin than previously prescribed, patient endorses fasting hyperglycemia, hence she has self increased her Basaglar -I do suspect that her hyperglycemia is postprandial, I will increase her Humalog as below and I have encouraged her to continue to use the correction scale before each meal -I have recommended increasing her Jardiance in the past, but she declined as this was prescribed by her cardiologist -I did recommend starting her on a GLP-1 agonist, we discussed the renal, vascular benefits as well as weight benefits, I also offered to provide her patient assistance papers but the patient declines stating she is already on too many medications and does not want to add any extra  MEDICATIONS: Continue Basaglar 45 units ONCE daily  Continue  Jardiance 10 mg daily per cardiology Increase Humalog 6 units with each meal Continue CF: Humalog ( BG - 140/25) 3 times daily before every meal  EDUCATION / INSTRUCTIONS: BG monitoring instructions: Patient is instructed to check her blood sugars 4 times a day, before each meal and bedtime. Call  [facility] if: BG persistently < 70 I reviewed the Rule of 15 for the treatment of hypoglycemia in detail with the patient. Literature supplied.   F/U in 4 months   Signed electronically by: Mack Guise, MD  [facility]  [address] [place], [place] [address] Phone: [phone] FAX: [phone]   CC: Seward Carol, [place] [employer] [place] [address] [address] Phone: [phone]  Fax: [phone]  Return to Endocrinology clinic as below: Future Appointments  Date Time Provider [place]  08/16/2022 10:30 AM Mayank Teuscher, Melanie Crazier, MD [facility] None  08/20/2022  9:45 AM [name], DPM [facility]  08/28/2022  1:00 PM MC-MR 1 [facility]  09/05/2022 11:30 AM MC-CV HS VASC 5 [facility] VVS  09/05/2022 12:00 PM [facility]-CV HS VASC 5 [facility]  09/05/2022 12:20 PM Waynetta Sandy, MD [facility]  11/01/2022  7:00 AM [facility] DEVICE REMOTES [facility]  01/31/2023  7:00 AM [facility] DEVICE REMOTES [facility]  05/02/2023  7:00 AM [facility] DEVICE REMOTES [facility]  08/01/2023  7:00 AM [facility] DEVICE REMOTES [facility]  10/31/2023  7:00 AM [facility] DEVICE REMOTES [facility]  01/30/2024  7:00 AM [facility] DEVICE REMOTES [facility]  04/30/2024  7:00 AM [facility] DEVICE REMOTES [facility]

## 2022-08-16 NOTE — Patient Instructions (Addendum)
-   Continue  Basaglar 45 units ONCE daily  - Continue Jardiance 10 mg daily  - Increase HUmalog 6 units with each meal  -Humalog correctional insulin:Use the scale below to help guide you before meals   Blood sugar before meal Number of units to inject  Less than 165 0 unit  166 - 190 1 units  191 - 215 2 units  216 - 240 3 units  241- 265 4 units     HOW TO TREAT LOW BLOOD SUGARS (Blood sugar LESS THAN 70 MG/DL) Please follow the RULE OF 15 for the treatment of hypoglycemia treatment (when your (blood sugars are less than 70 mg/dL)   STEP 1: Take 15 grams of carbohydrates when your blood sugar is low, which includes:  3-4 GLUCOSE TABS  OR 3-4 OZ OF JUICE OR REGULAR SODA OR ONE TUBE OF GLUCOSE GEL    STEP 2: RECHECK blood sugar in 15 MINUTES STEP 3: If your blood sugar is still low at the 15 minute recheck --> then, go back to STEP 1 and treat AGAIN with another 15 grams of carbohydrates.

## 2022-08-16 NOTE — Telephone Encounter (Signed)
After home visit today patient reports decreased urine output as well as no weight loss and swelling still present in both lower legs with some edema. She completed the increase of Torsemide 40mg  daily for 3 days last week and back to 20mg  per [name] Clegg. Today I was able to speak to Danielle [name] in HF clinic about same and she ordered increase Torsemide to 60mg  daily and obtain labs on Monday. I will help get those scheduled for her. Call routed to Danielle [name] to attach order for same. Danielle [name] is aware of same.    [name], [place] 08/16/2022

## 2022-08-17 NOTE — Telephone Encounter (Signed)
Left message for her to call back

## 2022-08-20 ENCOUNTER — Ambulatory Visit: Payer: Medicare HMO | Admitting: Podiatry

## 2022-08-20 ENCOUNTER — Telehealth (HOSPITAL_COMMUNITY): Payer: Self-pay

## 2022-08-20 ENCOUNTER — Encounter: Payer: Self-pay | Admitting: Podiatry

## 2022-08-20 ENCOUNTER — Ambulatory Visit (HOSPITAL_COMMUNITY)
Admission: RE | Admit: 2022-08-20 | Discharge: 2022-08-20 | Disposition: A | Discharge status: Home / Self Care | Payer: Medicare HMO | Source: Ambulatory Visit | Attending: Internal Medicine | Admitting: Internal Medicine

## 2022-08-20 DIAGNOSIS — I5022 Chronic systolic (congestive) heart failure: Secondary | ICD-10-CM

## 2022-08-20 DIAGNOSIS — L97521 Non-pressure chronic ulcer of other part of left foot limited to breakdown of skin: Secondary | ICD-10-CM | POA: Diagnosis not present

## 2022-08-20 DIAGNOSIS — E1151 Type 2 diabetes mellitus with diabetic peripheral angiopathy without gangrene: Secondary | ICD-10-CM

## 2022-08-20 LAB — BASIC METABOLIC PANEL
Anion gap: 12 (ref 5–15)
BUN: 46 mg/dL — ABNORMAL HIGH (ref 8–23)
CO2: 25 mmol/L (ref 22–32)
Calcium: 9.2 mg/dL (ref 8.9–10.3)
Chloride: 102 mmol/L (ref 98–111)
Creatinine, Ser: 1.69 mg/dL — ABNORMAL HIGH (ref 0.44–1.00)
GFR, Estimated: 31 mL/min — ABNORMAL LOW (ref >60)
Glucose, Bld: 224 mg/dL — ABNORMAL HIGH (ref 70–99)
Potassium: 4.5 mmol/L (ref 3.5–5.1)
Sodium: 139 mmol/L (ref 135–145)

## 2022-08-20 LAB — BRAIN NATRIURETIC PEPTIDE: B Natriuretic Peptide: 179.4 pg/mL — ABNORMAL HIGH (ref 0.0–100.0)

## 2022-08-20 NOTE — Telephone Encounter (Signed)
Called Danielle [name] to see if she was able to complete the labs she had scheduled today and has been complying with the recent med change she was instructed to start last Thursday with Torsemide increased to 60mg  daily and she states no, she has not picked up Torsemide from [employer] yet. She has been taking 20mg - not the updated 60mg . I will forward to triage to make them aware with the labs she had today and how this reflects same. I stressed to her the importance of picking up the updated Torsemide so we could being the 60mg  daily as instructed by Danielle Jester NP on 08/16/22.  I advised her to call me once she picks up same and I will come out and personally place this in her pill box. She agreed with plan.    Danielle [name], [place] 08/20/2022

## 2022-08-20 NOTE — Telephone Encounter (Signed)
Left message to call back  

## 2022-08-20 NOTE — Progress Notes (Signed)
Subjective:  Patient ID: Danielle [name], female    DOB: January 08, 1946,   MRN: [id number]  Chief Complaint  Patient presents with   Wound Check    77 y.o. female presents for follow-up of left second toe wound. Danielle [name] Has been dressing as instructed .finished antibiotics.  Underwent vascular procedure 3/4.   Denies any other pedal complaints. Denies n/v/f/c.   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[place]) 02/05/2017   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 05/20/2018   Acute kidney failure ([place]) 05/15/2018   Arthritis    "all over" (05/22/2018)   Blood dyscrasia    per pt-has small blood cells-appears as if anemic   Breast cancer, left breast ([place]) 1985   CHF (congestive heart failure) (HCC)    Complication of anesthesia    difficult to awaken from per pt   Coronary artery disease    Dyspnea    Essential hypertension    Heart disease    Heart murmur    History of gout    Hypothyroidism    Obesity (BMI 30-39.9) 11/11/2017   OSA (obstructive sleep apnea) 02/18/2017    severe obstructive sleep apnea with an AHI of 75.6/h and mild central sleep apnea with a CAI of 7.7/h.  Oxygen saturations dropped as low as 82%.   He is on CPAP at 9 cm H2O.   OSA on CPAP    Personal history of chemotherapy    Personal history of radiation therapy    Presence of permanent cardiac pacemaker    Primary localized osteoarthritis of right knee 05/13/2018   Refusal of blood transfusions as patient is Jehovah's Witness    Type II diabetes mellitus ([place])     Objective:  Physical Exam: Vascular: DP/PT pulses 2/4 bilateral. CFT <3 seconds. Absent hair growth on digits. Edema noted to bilateral lower extremities. Xerosis noted bilaterally.  Skin. No lacerations or abrasions bilateral feet. Nails 1-5 bilateral  are thickened discolored and  with subungual debris. Left second digit ulceration about 0.5 cm x 0.3 cm x 0.1 cm with  overyling hyperkeratosis.  Some edema and erythema noted to the digit. No purulence. Does probe to bone today.  Musculoskeletal: MMT 5/5 bilateral lower extremities in DF, PF, Inversion and Eversion. Deceased ROM in DF of ankle joint.  Neurological: Sensation intact to light touch. Protective sensation diminished bilateral.    Assessment:   1. Skin ulcer of second toe of left foot, limited to breakdown of skin (Danielle [name])   2. Type II diabetes mellitus with peripheral circulatory disorder Danielle [name] Danielle [name])          Plan:  Patient was evaluated and treated and all questions answered. Ulcer left second digit with fat layer exposed.  X-rays left foot no significant difference noted from previous. Possible erosions of the distal phalanx.  -Debridement as below.  -Dressed with betadine, DSD. -Off-loading with surgical shoe. -Doxycycline continue course.  Vascular procedure preformed on 3/4  MRI ordered. Not scheduled unfortunately until 4/2.  WBC 8.2  CRP and ESR normal range.  Culture essentially no growth.  -Did discuss hospitalization with patient and inpatient care for the infection in her bone. Toe currently appears stable and will plan to try and remain outpatient. Toe does appear to be improving. Did discuss if any worsening in the meantime to head to the hospital.  -Discussed glucose control and proper protein-rich diet.  -Discussed if any worsening redness, pain, fever or chills  to call or may need to report to the emergency room. Patient expressed understanding.     Procedure: Excisional Debridement of Wound Rationale: Removal of non-viable soft tissue from the wound to promote healing.  Anesthesia: none Pre-Debridement Wound Measurements: Overlying callus  Post-Debridement Wound Measurements: 0.5 cm x 0.3 cm x 0.1 cm  Type of Debridement: Sharp Excisional Tissue Removed: Non-viable soft tissue Depth of Debridement: subcutaneous tissue. Technique: Sharp excisional debridement to  bleeding, viable wound base.  Dressing: Dry, sterile, compression dressing. Disposition: Patient tolerated procedure well. Patient to return in 2 week for follow-up.  Return in about 2 weeks (around 09/03/2022) for wound check.    Return in about 2 weeks (around 09/03/2022) for wound check.   Danielle [name], DPM

## 2022-08-23 ENCOUNTER — Other Ambulatory Visit (HOSPITAL_COMMUNITY): Payer: Self-pay

## 2022-08-23 NOTE — Progress Notes (Signed)
Paramedicine Encounter    Patient ID: Danielle [name], female    DOB: 1946/01/28, 77 y.o.   MRN: [id number]   Complaints- lower leg swelling, no weight loss.    Assessment- CAOX4, warm and dry, lower leg edema noted, no shortness of breath, lungs clear.   Compliance with meds- has not been taking the increased dose of Torsemide as instructed- she started same today on 08/23/22.   Pill box filled- for one week.   Refills needed- Eliquis, Losartan   Meds changes since last visit- increase torsemide to 60mg  daily.     Social changes- none    BP 98/70   Pulse 76   Resp 16   Wt 210 lb (95.3 kg)   SpO2 97%   BMI 36.05 kg/m  Weight yesterday-210lbs Last visit weight-- 210lbs  Arrived for home visit for [facility] who reports to be feeling well. She denied chest pain, shortness of breath, dizziness. She does admit to no weight loss, continued decrease in urine, continued swelling in lower legs. She never started the 60mg  of Torsemide due to delay with Pharmacy. She has them today and began same dose. I reviewed meds and confirmed same, filling pill box for one week. We reviewed upcoming appointments and confirmed same.   CBG today at 10 was 326. She took insulin at that time. I placed her dexcom on left arm. She knows to follow this closely. I reviewed education on diet and med compliance.   Refills called into [facility].   Home visit complete.  I will see [name] in one week.     Danielle [name], [place]    ACTION: Home visit completed    Patient Care Team: Seward Carol, MD as PCP - General (Internal Medicine) Constance Haw, MD as PCP - Electrophysiology (Cardiology) Sueanne Margarita, MD as PCP - Sleep Medicine (Cardiology) Bensimhon, Shaune Pascal, MD as PCP - Cardiology (Cardiology) Jorge Ny, LCSW as Social Worker (Licensed Clinical Social Worker) Shamleffer, Melanie Crazier, MD as Consulting Physician (Endocrinology)  Patient Active Problem  List   Diagnosis Date Noted   Chronic systolic heart failure ([place]) 05/08/2022   Type 2 diabetes mellitus with stage 3a chronic kidney disease, with long-term current use of insulin ([place]) 02/16/2022   Prosthetic joint implant failure, initial encounter ([place]) 12/20/2021   Pseudophakia of both eyes 11/15/2021   DVT (deep venous thrombosis) ([place]) 07/31/2021   Acute blood loss anemia    Septic Arthritis, Infection of prosthetic right knee joint ([place]), MSSA infection 123XX123   Complication of internal right knee prosthesis ([place]) 07/10/2021   CAD (coronary artery disease) 06/08/2021   Carotid artery disease ([place]) 06/08/2021   Symptomatic bradycardia 06/08/2021   Paroxysmal atrial fibrillation ([place]) 06/08/2021   CKD (chronic kidney disease), stage III ([place]) 06/08/2021   Polyneuropathy associated with underlying disease ([place]) 05/04/2020   Severe nonproliferative diabetic retinopathy of left eye, with macular edema, associated with type 2 diabetes mellitus ([place]) 11/05/2019   Severe nonproliferative diabetic retinopathy of right eye, with macular edema, associated with type 2 diabetes mellitus ([place]) 11/05/2019   Retinal hemorrhage of right eye 11/05/2019   Retinal hemorrhage of left eye 11/05/2019   Diabetes mellitus ([place]) 05/14/2019   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, with long-term current use of insulin ([place]) 05/14/2019   Type 2 diabetes mellitus with diabetic polyneuropathy, with long-term current use of insulin ([place]) 05/14/2019   History of gout 05/20/2018   Primary localized osteoarthritis of right knee 05/13/2018   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) 12/20/2017   Obesity (BMI 30-39.9) 11/11/2017   OSA (obstructive sleep apnea) 02/18/2017   Essential hypertension    L Breast Cancer    Hypothyroidism     Current Outpatient Medications:    acetaminophen (TYLENOL) 500 MG tablet, Take 500-1,000 mg by mouth every 6 (six) hours as needed (for pain or  headaches)., Disp: , Rfl:    allopurinol (ZYLOPRIM) 100 MG tablet, Take 100 mg by mouth daily., Disp: , Rfl:    apixaban (ELIQUIS) 5 MG TABS tablet, Take 1 tablet (5 mg total) by mouth 2 (two) times daily., Disp: 60 tablet, Rfl: 11   aspirin EC 81 MG tablet, Take 1 tablet (81 mg total) by mouth daily. Swallow whole., Disp: 360 tablet, Rfl: 0   atorvastatin (LIPITOR) 80 MG tablet, Take 1 tablet (80 mg total) by mouth at bedtime., Disp: 90 tablet, Rfl: 3   Biotin 10 MG TABS, Take 10 mg by mouth daily., Disp: , Rfl:    Blood Glucose Monitoring Suppl (ONETOUCH VERIO) w/Device KIT, Use to check blood sugar, Disp: 1 kit, Rfl: 0   carvedilol (COREG) 3.125 MG tablet, Take 1 tablet (3.125 mg total) by mouth 2 (two) times daily with a meal., Disp: 60 tablet, Rfl: 0   celecoxib (CELEBREX) 100 MG capsule, Take 100 mg by mouth 2 (two) times daily., Disp: , Rfl:    clotrimazole-betamethasone (LOTRISONE) lotion, Apply 1 application topically 2 (two) times daily as needed (itching)., Disp: , Rfl:    Continuous Blood Gluc Sensor (DEXCOM G6 SENSOR) MISC, 1 Device by Does not apply route as directed., Disp: 9 each, Rfl: 3   empagliflozin (JARDIANCE) 10 MG TABS tablet, Take 1 tablet (10 mg total) by mouth daily., Disp: 30 tablet, Rfl: 11   Ferrous Sulfate (IRON) 325 (65 Fe) MG TABS, Take 1 tablet (325 mg total) by mouth 2 (two) times daily before a meal., Disp: 180 tablet, Rfl: 3   folic acid (FOLVITE) 1 MG tablet, Take 1 tablet (1 mg total) by mouth daily., Disp: 30 tablet, Rfl: 2   gabapentin (NEURONTIN) 300 MG capsule, Take 300 mg by mouth 2 (two) times daily., Disp: , Rfl:    Insulin Glargine (BASAGLAR KWIKPEN) 100 UNIT/ML, Inject 36 Units into the skin at bedtime., Disp: 45 mL, Rfl: 0   insulin lispro (HUMALOG) 100 UNIT/ML injection, Inject 0.05 mLs (5 Units total) into the skin 3 (three) times daily before meals., Disp: 10 mL, Rfl: 0   Lancets (ONETOUCH DELICA PLUS 123XX123) MISC, USE   TO CHECK GLUCOSE 4 TIMES  DAILY, Disp: 100 each, Rfl: 3   latanoprost (XALATAN) 0.005 % ophthalmic solution, Place 1 drop into both eyes at bedtime., Disp: , Rfl:    levothyroxine (SYNTHROID, LEVOTHROID) 75 MCG tablet, Take 75 mcg by mouth daily before breakfast., Disp: , Rfl:    losartan (COZAAR) 25 MG tablet, Take 1 tablet (25 mg total) by mouth daily., Disp: 30 tablet, Rfl: 6   LUMIGAN 0.01 % SOLN, Place 1 drop into both eyes at bedtime., Disp: , Rfl:    Multiple Vitamins-Minerals (ALIVE ONCE DAILY WOMENS 50+ PO), Take 1 tablet by mouth daily., Disp: , Rfl:    spironolactone (ALDACTONE) 25 MG tablet, Take 0.5 tablets (12.5 mg total) by mouth daily., Disp: 45 tablet, Rfl: 3   torsemide (DEMADEX) 20 MG tablet, Take 3 tablets by mouth daily, Disp: 90 tablet, Rfl: 3   Travoprost, BAK Free, (TRAVATAN) 0.004 % SOLN ophthalmic solution, Place 1  drop into both eyes at bedtime., Disp: , Rfl:  Allergies  Allergen Reactions   Codeine Other (See Comments)   Other Other (See Comments)    Blood Product Refusal (Jehovah's witness)     Tape Other (See Comments)    Leaves blisters and marks on the skin   Sulfa Antibiotics Rash   Uloric [Febuxostat] Rash     Social History   Socioeconomic History   Marital status: Widowed    Spouse name: Not on file   Number of children: 3   Years of education: Not on file   Highest education level: Some college, no degree  Occupational History   Occupation: retired  Tobacco Use   Smoking status: Former    Packs/day: 1.00    Years: 22.00    Additional pack years: 0.00    Total pack years: 22.00    Types: Cigarettes    Quit date: 51    Years since quitting: 41.2   Smokeless tobacco: Never  Vaping Use   Vaping Use: Never used  Substance and Sexual Activity   Alcohol use: Yes    Comment: 05/22/2018 "glass of wine couple times/wk"   Drug use: Not Currently   Sexual activity: Not Currently    Birth control/protection: None, Post-menopausal  Other Topics Concern   Not on file   Social History Narrative   Not on file   Social Determinants of Health   Financial Resource Strain: Medium Risk (12/22/2021)   Overall Financial Resource Strain (CARDIA)    Difficulty of Paying Living Expenses: Somewhat hard  Food Insecurity: No Food Insecurity (05/08/2018)   Hunger Vital Sign    Worried About Running Out of Food in the Last Year: Never true    [place] in the Last Year: Never true  Transportation Needs: Unmet Transportation Needs (05/08/2022)   PRAPARE - Hydrologist (Medical): Yes    Lack of Transportation (Non-Medical): Yes  Physical Activity: Not on file  Stress: Not on file  Social Connections: Not on file  Intimate Partner Violence: Not on file    Physical Exam      Future Appointments  Date Time Provider [place]  08/28/2022  1:00 PM MC-MR 1 [facility]  09/03/2022  9:15 AM [name], DPM [facility]  09/05/2022 11:30 AM MC-CV HS VASC 5 [facility]  09/05/2022 12:00 PM MC-CV HS VASC 5 [facility]  09/05/2022 12:20 PM Waynetta Sandy, MD [facility]  11/01/2022  7:00 AM [facility] DEVICE REMOTES [facility]  01/31/2023  7:00 AM [facility] DEVICE REMOTES [facility]  02/22/2023 11:30 AM Shamleffer, Melanie Crazier, MD [facility] None  05/02/2023  7:00 AM [facility] DEVICE REMOTES [facility]  08/01/2023  7:00 AM [facility] DEVICE REMOTES [facility]  10/31/2023  7:00 AM [facility] DEVICE REMOTES [facility]  01/30/2024  7:00 AM [facility] DEVICE REMOTES [facility]  04/30/2024  7:00 AM [facility] DEVICE REMOTES [facility]

## 2022-08-28 ENCOUNTER — Ambulatory Visit (HOSPITAL_COMMUNITY): Admission: RE | Admit: 2022-08-28 | Payer: Medicare HMO | Source: Ambulatory Visit

## 2022-08-28 ENCOUNTER — Telehealth (HOSPITAL_COMMUNITY): Payer: Self-pay | Admitting: Licensed Clinical Social Worker

## 2022-08-28 NOTE — Telephone Encounter (Signed)
HF Paramedicine Team Based Care Meeting  HF MD- NA  HF NP - [name] NP-C   [facility] admit within the last 30 days for heart failure? no  Medications concerns? Struggles with recalling what to take and when- after repeated educations she gets easily forgetful and still requires paramedic to complete weekly pill boxes  Transportation issues ? Uses [facility] [place]  Education needs? Needs continued education on diet.  Also falls frequently so needs safety education and needs coaching on how to manage HF and DM diagnoses.  SDOH - none  Eligible for discharge? Do not think she is good candidate for discharge at this time- think she would be non compliant with medications without paramedic follow up.  Jorge Ny, LCSW Clinical Social Worker [facility] Desk#: [phone] Cell#: [phone]

## 2022-08-29 ENCOUNTER — Ambulatory Visit (HOSPITAL_COMMUNITY)
Admission: RE | Admit: 2022-08-29 | Discharge: 2022-08-29 | Disposition: A | Discharge status: Home / Self Care | Payer: Medicare HMO | Source: Ambulatory Visit | Attending: Podiatry | Admitting: Podiatry

## 2022-08-29 ENCOUNTER — Other Ambulatory Visit: Payer: Self-pay | Admitting: *Deleted

## 2022-08-29 ENCOUNTER — Ambulatory Visit (INDEPENDENT_AMBULATORY_CARE_PROVIDER_SITE_OTHER): Payer: Medicare HMO

## 2022-08-29 DIAGNOSIS — I495 Sick sinus syndrome: Secondary | ICD-10-CM | POA: Diagnosis not present

## 2022-08-29 DIAGNOSIS — L97524 Non-pressure chronic ulcer of other part of left foot with necrosis of bone: Secondary | ICD-10-CM

## 2022-08-29 DIAGNOSIS — E1151 Type 2 diabetes mellitus with diabetic peripheral angiopathy without gangrene: Secondary | ICD-10-CM

## 2022-08-29 DIAGNOSIS — L97529 Non-pressure chronic ulcer of other part of left foot with unspecified severity: Secondary | ICD-10-CM | POA: Diagnosis not present

## 2022-08-29 DIAGNOSIS — I70222 Atherosclerosis of native arteries of extremities with rest pain, left leg: Secondary | ICD-10-CM

## 2022-08-29 LAB — CUP PACEART REMOTE DEVICE CHECK
Battery Remaining Longevity: 36 mo
Battery Voltage: 2.96 V
Brady Statistic AP VP Percent: 0.01 %
Brady Statistic AP VS Percent: 2.29 %
Brady Statistic AS VP Percent: 0.06 %
Brady Statistic AS VS Percent: 97.64 %
Brady Statistic RA Percent Paced: 2.3 %
Brady Statistic RV Percent Paced: 0.07 %
Date Time Interrogation Session: 20240403141027
Implantable Lead Connection Status: 753985
Implantable Lead Connection Status: 753985
Implantable Lead Implant Date: 20160222
Implantable Lead Implant Date: 20160222
Implantable Lead Location: 753859
Implantable Lead Location: 753860
Implantable Lead Model: 5076
Implantable Lead Model: 5076
Implantable Pulse Generator Implant Date: 20160222
Lead Channel Impedance Value: 399 Ohm
Lead Channel Impedance Value: 418 Ohm
Lead Channel Impedance Value: 418 Ohm
Lead Channel Impedance Value: 494 Ohm
Lead Channel Pacing Threshold Amplitude: 0.75 V
Lead Channel Pacing Threshold Amplitude: 0.75 V
Lead Channel Pacing Threshold Pulse Width: 0.4 ms
Lead Channel Pacing Threshold Pulse Width: 0.4 ms
Lead Channel Sensing Intrinsic Amplitude: 16.875 mV
Lead Channel Sensing Intrinsic Amplitude: 16.875 mV
Lead Channel Sensing Intrinsic Amplitude: 3.25 mV
Lead Channel Sensing Intrinsic Amplitude: 3.25 mV
Lead Channel Setting Pacing Amplitude: 2 V
Lead Channel Setting Pacing Amplitude: 2 V
Lead Channel Setting Pacing Pulse Width: 0.4 ms
Lead Channel Setting Sensing Sensitivity: 2 mV
Zone Setting Status: 755011
Zone Setting Status: 755011

## 2022-08-29 NOTE — Progress Notes (Signed)
Per order,  Changed device settings for MRI to DOO at 95 bpm MRI mode Will program device back to pre-MRI settings after completion of exam, and send transmission.

## 2022-08-30 ENCOUNTER — Other Ambulatory Visit (HOSPITAL_COMMUNITY): Payer: Self-pay

## 2022-08-30 NOTE — Progress Notes (Signed)
Paramedicine Encounter    Patient ID: Danielle [name], female    DOB: 08-Oct-1945, 77 y.o.   MRN: [id number]  Arrived for home visit for [place] who reports to be feeling well but very tired. She states she slept with her CPAP last night after being reminded by her doctor to be using it nightly. I educated her on the importance of compliance with her CPAP and the benefits of same. She agreed to use it every night. Her CBG was 350- she had just ate lunch and has not taken any insulin today. I reminded her the doses of her insulin and how often to take it. She agreed. I placed a new Dexcom on her arm today as well. She continues to have bilateral lower leg edema, no weight loss, and reports she is urinating a litte more than she was now taking the 60mg  of Torsemide daily. I encouraged her to be mindful of her sodium intake and her fluid intake. She verbalized understanding. I reviewed meds and filled pill box for one week. Upcoming appointments reviewed and confirmed. Home visit complete.    Complaints- NONE   Assessment- CAOX4, warm and dry, lower leg swelling, no weight gain or weight loss, no shortness of breath, chest pain, dizziness.   Compliance with meds- no missed doses over the last week.   Pill box filled- for one week.   Refills needed- losartan, Jardiance, Eliqjuis   Meds changes since last visit- taking Torsemide 60mg  daily.      Social changes- none    BP 112/68   Pulse 70   Resp 16   Wt 210 lb (95.3 kg)   SpO2 96%   BMI 36.05 kg/m  Weight yesterday-210lbs Last visit weight-210lbs  CBG- 350 (no insulin today)     [name], EMT-Paramedic [phone]  ACTION: Home visit completed    Patient Care Team: Seward Carol, MD as PCP - General (Internal Medicine) Constance Haw, MD as PCP - Electrophysiology (Cardiology) Sueanne Margarita, MD as PCP - Sleep Medicine (Cardiology) Bensimhon, Shaune Pascal, MD as PCP - Cardiology (Cardiology) Jorge Ny, LCSW  as Social Worker (Licensed Clinical Social Worker) Shamleffer, Melanie Crazier, MD as Consulting Physician (Endocrinology)  Patient Active Problem List   Diagnosis Date Noted   Chronic systolic heart failure 123456   Type 2 diabetes mellitus with stage 3a chronic kidney disease, with long-term current use of insulin 02/16/2022   Prosthetic joint implant failure, initial encounter 12/20/2021   Pseudophakia of both eyes 11/15/2021   DVT (deep venous thrombosis) 07/31/2021   Acute blood loss anemia    Septic Arthritis, Infection of prosthetic right knee joint ([place]), MSSA infection [id number]   Complication of internal right knee prosthesis 07/10/2021   CAD (coronary artery disease) 06/08/2021   Carotid artery disease 06/08/2021   Symptomatic bradycardia 06/08/2021   Paroxysmal atrial fibrillation 06/08/2021   CKD (chronic kidney disease), stage III 06/08/2021   Polyneuropathy associated with underlying disease 05/04/2020   Severe nonproliferative diabetic retinopathy of left eye, with macular edema, associated with type 2 diabetes mellitus 11/05/2019   Severe nonproliferative diabetic retinopathy of right eye, with macular edema, associated with type 2 diabetes mellitus 11/05/2019   Retinal hemorrhage of right eye 11/05/2019   Retinal hemorrhage of left eye 11/05/2019   Diabetes mellitus 05/14/2019   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, with long-term current use of insulin 05/14/2019   Type 2 diabetes mellitus with diabetic polyneuropathy, with long-term current use of insulin 05/14/2019  History of gout 05/20/2018   Primary localized osteoarthritis of right knee 05/13/2018   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) 12/20/2017   Obesity (BMI 30-39.9) 11/11/2017   OSA (obstructive sleep apnea) 02/18/2017   Essential hypertension    L Breast Cancer    Hypothyroidism     Current Outpatient Medications:    acetaminophen (TYLENOL) 500 MG tablet, Take  500-1,000 mg by mouth every 6 (six) hours as needed (for pain or headaches)., Disp: , Rfl:    allopurinol (ZYLOPRIM) 100 MG tablet, Take 100 mg by mouth daily., Disp: , Rfl:    apixaban (ELIQUIS) 5 MG TABS tablet, Take 1 tablet (5 mg total) by mouth 2 (two) times daily., Disp: 60 tablet, Rfl: 11   aspirin EC 81 MG tablet, Take 1 tablet (81 mg total) by mouth daily. Swallow whole., Disp: 360 tablet, Rfl: 0   atorvastatin (LIPITOR) 80 MG tablet, Take 1 tablet (80 mg total) by mouth at bedtime., Disp: 90 tablet, Rfl: 3   Biotin 10 MG TABS, Take 10 mg by mouth daily., Disp: , Rfl:    Blood Glucose Monitoring Suppl (ONETOUCH VERIO) w/Device KIT, Use to check blood sugar, Disp: 1 kit, Rfl: 0   carvedilol (COREG) 3.125 MG tablet, Take 1 tablet (3.125 mg total) by mouth 2 (two) times daily with a meal., Disp: 60 tablet, Rfl: 0   celecoxib (CELEBREX) 100 MG capsule, Take 100 mg by mouth 2 (two) times daily., Disp: , Rfl:    clotrimazole-betamethasone (LOTRISONE) lotion, Apply 1 application topically 2 (two) times daily as needed (itching)., Disp: , Rfl:    Continuous Blood Gluc Sensor (DEXCOM G6 SENSOR) MISC, 1 Device by Does not apply route as directed., Disp: 9 each, Rfl: 3   empagliflozin (JARDIANCE) 10 MG TABS tablet, Take 1 tablet (10 mg total) by mouth daily., Disp: 30 tablet, Rfl: 11   Ferrous Sulfate (IRON) 325 (65 Fe) MG TABS, Take 1 tablet (325 mg total) by mouth 2 (two) times daily before a meal., Disp: 180 tablet, Rfl: 3   folic acid (FOLVITE) 1 MG tablet, Take 1 tablet (1 mg total) by mouth daily., Disp: 30 tablet, Rfl: 2   gabapentin (NEURONTIN) 300 MG capsule, Take 300 mg by mouth 2 (two) times daily., Disp: , Rfl:    Insulin Glargine (BASAGLAR KWIKPEN) 100 UNIT/ML, Inject 36 Units into the skin at bedtime., Disp: 45 mL, Rfl: 0   insulin lispro (HUMALOG) 100 UNIT/ML injection, Inject 0.05 mLs (5 Units total) into the skin 3 (three) times daily before meals., Disp: 10 mL, Rfl: 0   Lancets  (ONETOUCH DELICA PLUS 123XX123) MISC, USE   TO CHECK GLUCOSE 4 TIMES DAILY, Disp: 100 each, Rfl: 3   latanoprost (XALATAN) 0.005 % ophthalmic solution, Place 1 drop into both eyes at bedtime., Disp: , Rfl:    levothyroxine (SYNTHROID, LEVOTHROID) 75 MCG tablet, Take 75 mcg by mouth daily before breakfast., Disp: , Rfl:    losartan (COZAAR) 25 MG tablet, Take 1 tablet (25 mg total) by mouth daily., Disp: 30 tablet, Rfl: 6   LUMIGAN 0.01 % SOLN, Place 1 drop into both eyes at bedtime., Disp: , Rfl:    Multiple Vitamins-Minerals (ALIVE ONCE DAILY WOMENS 50+ PO), Take 1 tablet by mouth daily., Disp: , Rfl:    spironolactone (ALDACTONE) 25 MG tablet, Take 0.5 tablets (12.5 mg total) by mouth daily., Disp: 45 tablet, Rfl: 3   torsemide (DEMADEX) 20 MG tablet, Take 3 tablets by mouth daily, Disp: 90  tablet, Rfl: 3   Travoprost, BAK Free, (TRAVATAN) 0.004 % SOLN ophthalmic solution, Place 1 drop into both eyes at bedtime., Disp: , Rfl:  Allergies  Allergen Reactions   Codeine Other (See Comments)   Other Other (See Comments)    Blood Product Refusal (Jehovah's witness)     Tape Other (See Comments)    Leaves blisters and marks on the skin   Sulfa Antibiotics Rash   Uloric [Febuxostat] Rash     Social History   Socioeconomic History   Marital status: Widowed    Spouse name: Not on file   Number of children: 3   Years of education: Not on file   Highest education level: Some college, no degree  Occupational History   Occupation: retired  Tobacco Use   Smoking status: Former    Packs/day: 1.00    Years: 22.00    Additional pack years: 0.00    Total pack years: 22.00    Types: Cigarettes    Quit date: 12    Years since quitting: 41.2   Smokeless tobacco: Never  Vaping Use   Vaping Use: Never used  Substance and Sexual Activity   Alcohol use: Yes    Comment: 05/22/2018 "glass of wine couple times/wk"   Drug use: Not Currently   Sexual activity: Not Currently    Birth  control/protection: None, Post-menopausal  Other Topics Concern   Not on file  Social History Narrative   Not on file   Social Determinants of Health   Financial Resource Strain: Medium Risk (12/22/2021)   Overall Financial Resource Strain (CARDIA)    Difficulty of Paying Living Expenses: Somewhat hard  Food Insecurity: No Food Insecurity (05/08/2018)   Hunger Vital Sign    Worried About Running Out of Food in the Last Year: Never true    [place] in the Last Year: Never true  Transportation Needs: Unmet Transportation Needs (05/08/2022)   PRAPARE - Hydrologist (Medical): Yes    Lack of Transportation (Non-Medical): Yes  Physical Activity: Not on file  Stress: Not on file  Social Connections: Not on file  Intimate Partner Violence: Not on file    Physical Exam      Future Appointments  Date Time Provider [place]  09/03/2022  9:15 AM [name], DPM [facility]  09/05/2022 11:30 AM [facility]  09/05/2022 12:00 PM [facility]  09/05/2022 12:20 PM Waynetta Sandy, MD [facility]  11/28/2022  2:00 PM [facility] DEVICE REMOTES [facility]  02/22/2023 11:30 AM Shamleffer, Melanie Crazier, MD [facility] None  02/27/2023  2:00 PM [facility] DEVICE REMOTES [facility]  05/30/2023  7:00 AM [facility] DEVICE REMOTES [facility]  08/28/2023  2:00 PM [facility] DEVICE REMOTES [facility]  11/27/2023  2:00 PM [facility] DEVICE REMOTES [facility]  02/26/2024  2:00 PM [facility] DEVICE REMOTES [facility]  05/27/2024  2:00 PM [facility] DEVICE REMOTES [facility]

## 2022-09-03 ENCOUNTER — Ambulatory Visit: Payer: Medicare HMO | Admitting: Podiatry

## 2022-09-04 DIAGNOSIS — N1832 Chronic kidney disease, stage 3b: Secondary | ICD-10-CM | POA: Diagnosis not present

## 2022-09-04 DIAGNOSIS — I110 Hypertensive heart disease with heart failure: Secondary | ICD-10-CM | POA: Diagnosis not present

## 2022-09-04 DIAGNOSIS — E1165 Type 2 diabetes mellitus with hyperglycemia: Secondary | ICD-10-CM | POA: Diagnosis not present

## 2022-09-04 DIAGNOSIS — L97529 Non-pressure chronic ulcer of other part of left foot with unspecified severity: Secondary | ICD-10-CM | POA: Diagnosis not present

## 2022-09-04 DIAGNOSIS — I480 Paroxysmal atrial fibrillation: Secondary | ICD-10-CM | POA: Diagnosis not present

## 2022-09-04 DIAGNOSIS — E1151 Type 2 diabetes mellitus with diabetic peripheral angiopathy without gangrene: Secondary | ICD-10-CM | POA: Diagnosis not present

## 2022-09-04 DIAGNOSIS — E1122 Type 2 diabetes mellitus with diabetic chronic kidney disease: Secondary | ICD-10-CM | POA: Diagnosis not present

## 2022-09-04 DIAGNOSIS — I5020 Unspecified systolic (congestive) heart failure: Secondary | ICD-10-CM | POA: Diagnosis not present

## 2022-09-04 DIAGNOSIS — E11621 Type 2 diabetes mellitus with foot ulcer: Secondary | ICD-10-CM | POA: Diagnosis not present

## 2022-09-04 DIAGNOSIS — E113413 Type 2 diabetes mellitus with severe nonproliferative diabetic retinopathy with macular edema, bilateral: Secondary | ICD-10-CM | POA: Diagnosis not present

## 2022-09-04 DIAGNOSIS — D631 Anemia in chronic kidney disease: Secondary | ICD-10-CM | POA: Diagnosis not present

## 2022-09-04 DIAGNOSIS — E7800 Pure hypercholesterolemia, unspecified: Secondary | ICD-10-CM | POA: Diagnosis not present

## 2022-09-05 ENCOUNTER — Ambulatory Visit (INDEPENDENT_AMBULATORY_CARE_PROVIDER_SITE_OTHER): Payer: Medicare HMO | Admitting: Vascular Surgery

## 2022-09-05 ENCOUNTER — Ambulatory Visit (INDEPENDENT_AMBULATORY_CARE_PROVIDER_SITE_OTHER)
Admission: RE | Admit: 2022-09-05 | Discharge: 2022-09-05 | Disposition: A | Discharge status: Home / Self Care | Payer: Medicare HMO | Source: Ambulatory Visit | Attending: Vascular Surgery | Admitting: Vascular Surgery

## 2022-09-05 ENCOUNTER — Ambulatory Visit (HOSPITAL_COMMUNITY)
Admission: RE | Admit: 2022-09-05 | Discharge: 2022-09-05 | Disposition: A | Discharge status: Home / Self Care | Payer: Medicare HMO | Source: Ambulatory Visit | Attending: Vascular Surgery | Admitting: Vascular Surgery

## 2022-09-05 ENCOUNTER — Encounter: Payer: Self-pay | Admitting: Vascular Surgery

## 2022-09-05 VITALS — BP 91/52 | HR 73 | Temp 98.1°F | Resp 20 | Ht 64.0 in | Wt 217.0 lb

## 2022-09-05 DIAGNOSIS — I70222 Atherosclerosis of native arteries of extremities with rest pain, left leg: Secondary | ICD-10-CM

## 2022-09-05 LAB — VAS US ABI WITH/WO TBI

## 2022-09-05 NOTE — Progress Notes (Signed)
Patient ID: Danielle [name], female   DOB: 1946/04/29, 77 y.o.   MRN: [id number]  Reason for Consult: No chief complaint on file.   Referred by Renford Dills, MD  Subjective:     HPI:  Danielle [name] is a 77 y.o. female with history of left second toe ulceration status post laser arthrectomy and balloon angioplasty of the anterior tibial artery.  She now follows up today for further evaluation.  She has been followed by podiatry with Dr. Ralene Cork and has had excisional debridement.  Denies fevers or chills and does not have foot pain.  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HCC) 02/05/2017   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 05/20/2018   Acute kidney failure (HCC) 05/15/2018   Arthritis    "all over" (05/22/2018)   Blood dyscrasia    per pt-has small blood cells-appears as if anemic   Breast cancer, left breast (HCC) 1985   CHF (congestive heart failure) (HCC)    Complication of anesthesia    difficult to awaken from per pt   Coronary artery disease    Dyspnea    Essential hypertension    Heart disease    Heart murmur    History of gout    Hypothyroidism    Obesity (BMI 30-39.9) 11/11/2017   OSA (obstructive sleep apnea) 02/18/2017    severe obstructive sleep apnea with an AHI of 75.6/h and mild central sleep apnea with a CAI of 7.7/h.  Oxygen saturations dropped as low as 82%.   He is on CPAP at 9 cm H2O.   OSA on CPAP    Personal history of chemotherapy    Personal history of radiation therapy    Presence of permanent cardiac pacemaker    Primary localized osteoarthritis of right knee 05/13/2018   Refusal of blood transfusions as patient is Jehovah's Witness    Type II diabetes mellitus (HCC)    Family History  Problem Relation Age of Onset   Multiple myeloma Mother    Heart disease Father    Other Sister    Heart disease Brother    Diabetes Sister    Other Sister    Past Surgical  History:  Procedure Laterality Date   ABDOMINAL AORTOGRAM W/LOWER EXTREMITY N/A 07/30/2022   Procedure: ABDOMINAL AORTOGRAM W/LOWER EXTREMITY;  Surgeon: Maeola Harman, MD;  Location: [facility] INVASIVE CV LAB;  Service: Cardiovascular;  Laterality: N/A;   APPLICATION OF WOUND VAC Right 04/14/2021   Procedure: APPLICATION OF WOUND VAC;  Surgeon: Joen Laura, MD;  Location[facility];  Service: Orthopedics;  Laterality: Right;   BREAST BIOPSY Left 1985   BREAST LUMPECTOMY Left 1985   BREAST LUMPECTOMY WITH NEEDLE LOCALIZATION AND AXILLARY LYMPH NODE DISSECTION  1985   CATARACT EXTRACTION W/ INTRAOCULAR LENS  IMPLANT, BILATERAL Bilateral    CORONARY ANGIOPLASTY WITH STENT PLACEMENT  2010, 2012,2017    2 done 2010 and 1 replaced 2012 and 2 replaced in 2017   DILATION AND CURETTAGE OF UTERUS     EXCISIONAL TOTAL KNEE ARTHROPLASTY WITH ANTIBIOTIC SPACERS Right 07/12/2021   Procedure: EXCISIONAL TOTAL KNEE ARTHROPLASTY WITH ANTIBIOTIC SPACERS;  Surgeon: Joen Laura, MD;  Location: [facility];  Service: Orthopedics;  Laterality: Right;   I & D KNEE WITH POLY EXCHANGE Right 04/14/2021   Procedure: IRRIGATION AND DEBRIDEMENT KNEE WITH POLY EXCHANGE;  Surgeon: Joen Laura, MD;  Location: [facility];  Service: Orthopedics;  Laterality: Right;   INSERT / REPLACE / REMOVE PACEMAKER  07/19/2014   JOINT REPLACEMENT     PERIPHERAL VASCULAR ATHERECTOMY  07/30/2022   Procedure: PERIPHERAL VASCULAR ATHERECTOMY;  Surgeon: Maeola Harman, MD;  Location: [facility] INVASIVE CV LAB;  Service: Cardiovascular;;   PERIPHERAL VASCULAR INTERVENTION  07/30/2022   Procedure: PERIPHERAL VASCULAR INTERVENTION;  Surgeon: Maeola Harman, MD;  Location: [facility] INVASIVE CV LAB;  Service: Cardiovascular;;   REPLACEMENT TOTAL KNEE Left 09/2012   THYROIDECTOMY, PARTIAL  1978   TONSILLECTOMY     TOTAL KNEE ARTHROPLASTY Right 05/13/2018   Procedure: TOTAL KNEE ARTHROPLASTY;  Surgeon: Teryl Lucy, MD;   Location: [facility] ORS;  Service: Orthopedics;  Laterality: Right;   TOTAL KNEE REVISION Right 12/20/2021   Procedure: REMOVAL OF RIGHT KNEE ANTIBIOTIC SPACER AND REVISION RIGHT KNEE TOTAL ARTHROPLASTY;  Surgeon: Joen Laura, MD;  Location: [facility];  Service: Orthopedics;  Laterality: Right;   TUBAL LIGATION      Short Social History:  Social History   Tobacco Use   Smoking status: Former    Packs/day: 1.00    Years: 22.00    Additional pack years: 0.00    Total pack years: 22.00    Types: Cigarettes    Quit date: 43    Years since quitting: 41.3   Smokeless tobacco: Never  Substance Use Topics   Alcohol use: Yes    Comment: 05/22/2018 "glass of wine couple times/wk"    Allergies  Allergen Reactions   Codeine Other (See Comments)   Other Other (See Comments)    Blood Product Refusal (Jehovah's witness)     Tape Other (See Comments)    Leaves blisters and marks on the skin   Sulfa Antibiotics Rash   Uloric [Febuxostat] Rash    Current Outpatient Medications  Medication Sig Dispense Refill   acetaminophen (TYLENOL) 500 MG tablet Take 500-1,000 mg by mouth every 6 (six) hours as needed (for pain or headaches).     allopurinol (ZYLOPRIM) 100 MG tablet Take 100 mg by mouth daily.     apixaban (ELIQUIS) 5 MG TABS tablet Take 1 tablet (5 mg total) by mouth 2 (two) times daily. 60 tablet 11   aspirin EC 81 MG tablet Take 1 tablet (81 mg total) by mouth daily. Swallow whole. 360 tablet 0   atorvastatin (LIPITOR) 80 MG tablet Take 1 tablet (80 mg total) by mouth at bedtime. 90 tablet 3   Biotin 10 MG TABS Take 10 mg by mouth daily.     Blood Glucose Monitoring Suppl (ONETOUCH VERIO) w/Device KIT Use to check blood sugar 1 kit 0   carvedilol (COREG) 3.125 MG tablet Take 1 tablet (3.125 mg total) by mouth 2 (two) times daily with a meal. 60 tablet 0   celecoxib (CELEBREX) 100 MG capsule Take 100 mg by mouth 2 (two) times daily.     clotrimazole-betamethasone (LOTRISONE) lotion  Apply 1 application topically 2 (two) times daily as needed (itching).     Continuous Blood Gluc Sensor (DEXCOM G6 SENSOR) MISC 1 Device by Does not apply route as directed. 9 each 3   empagliflozin (JARDIANCE) 10 MG TABS tablet Take 1 tablet (10 mg total) by mouth daily. 30 tablet 11   Ferrous Sulfate (IRON) 325 (65 Fe) MG TABS Take 1 tablet (325 mg total) by mouth 2 (two) times daily before a meal. 180 tablet 3   folic acid (FOLVITE) 1 MG tablet Take 1 tablet (1 mg total) by mouth daily. 30  tablet 2   gabapentin (NEURONTIN) 300 MG capsule Take 300 mg by mouth 2 (two) times daily.     Insulin Glargine (BASAGLAR KWIKPEN) 100 UNIT/ML Inject 36 Units into the skin at bedtime. 45 mL 0   insulin lispro (HUMALOG) 100 UNIT/ML injection Inject 0.05 mLs (5 Units total) into the skin 3 (three) times daily before meals. 10 mL 0   Lancets (ONETOUCH DELICA PLUS LANCET33G) MISC USE   TO CHECK GLUCOSE 4 TIMES DAILY 100 each 3   latanoprost (XALATAN) 0.005 % ophthalmic solution Place 1 drop into both eyes at bedtime.     levothyroxine (SYNTHROID, LEVOTHROID) 75 MCG tablet Take 75 mcg by mouth daily before breakfast.     losartan (COZAAR) 25 MG tablet Take 1 tablet (25 mg total) by mouth daily. 30 tablet 6   LUMIGAN 0.01 % SOLN Place 1 drop into both eyes at bedtime.     Multiple Vitamins-Minerals (ALIVE ONCE DAILY WOMENS 50+ PO) Take 1 tablet by mouth daily.     spironolactone (ALDACTONE) 25 MG tablet Take 0.5 tablets (12.5 mg total) by mouth daily. 45 tablet 3   torsemide (DEMADEX) 20 MG tablet Take 3 tablets by mouth daily 90 tablet 3   Travoprost, BAK Free, (TRAVATAN) 0.004 % SOLN ophthalmic solution Place 1 drop into both eyes at bedtime.     No current facility-administered medications for this visit.    Review of Systems  Constitutional:  Constitutional negative. HENT: HENT negative.  Eyes: Eyes negative.  Respiratory: Respiratory negative.  Cardiovascular: Positive for leg swelling.  GI:  Gastrointestinal negative.  Musculoskeletal: Musculoskeletal negative.  Skin: Positive for wound.  Neurological: Neurological negative. Hematologic: Hematologic/lymphatic negative.  Psychiatric: Psychiatric negative.        Objective:  Objective  Vitals:   09/05/22 1205  BP: (!) 91/52  Pulse: 73  Resp: 20  Temp: 98.1 F (36.7 C)  SpO2: 92%     Physical Exam HENT:     Head: Normocephalic.     Nose: Nose normal.  Eyes:     Pupils: Pupils are equal, round, and reactive to light.  Cardiovascular:     Rate and Rhythm: Normal rate.     Pulses:          Dorsalis pedis pulses are detected w/ Doppler on the left side.  Pulmonary:     Effort: Pulmonary effort is normal.  Abdominal:     General: Abdomen is flat.  Musculoskeletal:        General: Normal range of motion.     Cervical back: Normal range of motion.     Right lower leg: Edema present.     Left lower leg: Edema present.     Comments: The left second toe has dark discoloration although somewhat improved and the wound on the plantar aspect of the toe has healed with callus  Skin:    General: Skin is warm.     Capillary Refill: Capillary refill takes less than 2 seconds.  Neurological:     General: No focal deficit present.     Mental Status: She is alert.  Psychiatric:        Mood and Affect: Mood normal.        Behavior: Behavior normal.        Thought Content: Thought content normal.        Judgment: Judgment normal.     Data: ABI Findings:  +---------+------------------+-----+-----------+--------+  Right   Rt Pressure (mmHg)IndexWaveform   Comment   +---------+------------------+-----+-----------+--------+  Brachial 115                                         +---------+------------------+-----+-----------+--------+  PTA                            multiphasicCNO       +---------+------------------+-----+-----------+--------+  DP                             multiphasicCNO        +---------+------------------+-----+-----------+--------+  Great Toe65                0.49 Normal               +---------+------------------+-----+-----------+--------+   +---------+------------------+-----+-----------+-------+  Left    Lt Pressure (mmHg)IndexWaveform   Comment  +---------+------------------+-----+-----------+-------+  Brachial 133                                        +---------+------------------+-----+-----------+-------+  PTA     154               1.16 multiphasic         +---------+------------------+-----+-----------+-------+  DP                             multiphasicCNO      +---------+------------------+-----+-----------+-------+  Great Toe57                0.43 Abnormal            +---------+------------------+-----+-----------+-------+   +-------+-----------+-----------+------------+------------+  ABI/TBIToday's ABIToday's TBIPrevious ABIPrevious TBI  +-------+-----------+-----------+------------+------------+  Right [place]         0.49       [place]          0.39          +-------+-----------+-----------+------------+------------+  Left  [place]         0.43       [place]          0.34          +-------+-----------+-----------+------------+------------+        Arterial wall calcification precludes accurate ankle pressures and ABIs.  PPG tracings display appropriate pulsatility.  Bilateral ABIs appear essentially unchanged. Bilateral TBIs appear  increased.    Summary:  Right: Resting right ankle-brachial index indicates noncompressible right  lower extremity arteries. The right toe-brachial index is abnormal.   Left: Resting left ankle-brachial index indicates noncompressible left  lower extremity arteries. The left toe-brachial index is abnormal.       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  The toe is improving and followed by podiatry.  She is at high risk for amputation we discussed this today but no indication at this time.  I will have her follow-up in 3 to 4 months with repeat studies.  She is okay to travel from a vascular standpoint.     Maeola HarmanBrandon Christopher Elan Mcelvain MD [facility]

## 2022-09-06 ENCOUNTER — Telehealth (HOSPITAL_COMMUNITY): Payer: Self-pay | Admitting: Cardiology

## 2022-09-06 ENCOUNTER — Other Ambulatory Visit (HOSPITAL_COMMUNITY): Payer: Self-pay

## 2022-09-06 DIAGNOSIS — I5032 Chronic diastolic (congestive) heart failure: Secondary | ICD-10-CM

## 2022-09-06 NOTE — Progress Notes (Signed)
Paramedicine Encounter    Patient ID: Danielle [name], female    DOB: Aug 29, 1945, 77 y.o.   MRN: [id number]   Complaints- Fatigue, headache, high glucose readings, weight gain   Assessment- CAOX4, warm and dry seated no shortness of breath, some lower leg swelling but slightly improved from last week, glucose is 581 at 1430. She reports taking 20 units of her Humalog prior to my arrival. Other vitals as noted. Lungs clear.    Compliance with meds- a few insulin doses missed over the last week. One evening dose of meds missed.   Pill box filled- for one week.   Refills needed- jardiance   Meds changes since last visit- none     Social changes- none    BP 92/64   Pulse 70   Resp 16   Wt 217 lb (98.4 kg)   SpO2 93%   BMI 37.25 kg/m  Weight yesterday-217lbs Last visit weight-210lbs  Arrived for home visit for Danielle [name] who reports to be feeling very fatigued, thirsty, headache and high glucose readings for the last few days. She reports missing a few doses of her insulin because she has had doctors appointments as well as feeling so tired. I obtained vitals as noted that CBG was 581. She elected to take her basalgar insulin early and take 40 units at 1435. At 1608 I rechecked it and it was 375 and she was reporting to be feeling somewhat better. I replaced her dexcom as well and stressed the importance of maintaining her diabetes with diet and insulin control. She agreed with plan. We spent a lot of time on education for same. She is also up 7 lbs from our last weeks visit- I contacted the HF clinic and they agreed to schedule her for an APP visit on 4/24 at 1200. I wrote down reminder for same. I encouraged her to be sure she is limiting her fluid intake as well as her sodium. She verbalized understanding.   I reviewed meds and filled pill box for one week.  Refill for Jardiance called into [facility].  We reviewed upcoming appointments.  I plan to reach out Monday via phone to check in  on weight and sugar readings over the weekend. She agreed. Home visit will be planned for Thursday. Home visit complete.    [name], EMT-Paramedic [phone]  ACTION: Home visit completed    Patient Care Team: Renford Dills, MD as PCP - General (Internal Medicine) Regan Lemming, MD as PCP - Electrophysiology (Cardiology) Quintella Reichert, MD as PCP - Sleep Medicine (Cardiology) Bensimhon, Bevelyn Buckles, MD as PCP - Cardiology (Cardiology) Burna Sis, LCSW as Social Worker (Licensed Clinical Social Worker) Shamleffer, Konrad Dolores, MD as Consulting Physician (Endocrinology)  Patient Active Problem List   Diagnosis Date Noted   Chronic systolic heart failure 05/08/2022   Type 2 diabetes mellitus with stage 3a chronic kidney disease, with long-term current use of insulin 02/16/2022   Prosthetic joint implant failure, initial encounter 12/20/2021   Pseudophakia of both eyes 11/15/2021   DVT (deep venous thrombosis) 07/31/2021   Acute blood loss anemia    Septic Arthritis, Infection of prosthetic right knee joint (HCC), MSSA infection 07/10/2021   Complication of internal right knee prosthesis 07/10/2021   CAD (coronary artery disease) 06/08/2021   Carotid artery disease 06/08/2021   Symptomatic bradycardia 06/08/2021   Paroxysmal atrial fibrillation 06/08/2021   CKD (chronic kidney disease), stage III 06/08/2021   Polyneuropathy associated with underlying disease 05/04/2020  Severe nonproliferative diabetic retinopathy of left eye, with macular edema, associated with type 2 diabetes mellitus 11/05/2019   Severe nonproliferative diabetic retinopathy of right eye, with macular edema, associated with type 2 diabetes mellitus 11/05/2019   Retinal hemorrhage of right eye 11/05/2019   Retinal hemorrhage of left eye 11/05/2019   Diabetes mellitus 05/14/2019   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, with long-term current use of insulin 05/14/2019    Type 2 diabetes mellitus with diabetic polyneuropathy, with long-term current use of insulin 05/14/2019   History of gout 05/20/2018   Primary localized osteoarthritis of right knee 05/13/2018   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) 12/20/2017   Obesity (BMI 30-39.9) 11/11/2017   OSA (obstructive sleep apnea) 02/18/2017   Essential hypertension    L Breast Cancer    Hypothyroidism     Current Outpatient Medications:    acetaminophen (TYLENOL) 500 MG tablet, Take 500-1,000 mg by mouth every 6 (six) hours as needed (for pain or headaches)., Disp: , Rfl:    allopurinol (ZYLOPRIM) 100 MG tablet, Take 100 mg by mouth daily., Disp: , Rfl:    apixaban (ELIQUIS) 5 MG TABS tablet, Take 1 tablet (5 mg total) by mouth 2 (two) times daily., Disp: 60 tablet, Rfl: 11   aspirin EC 81 MG tablet, Take 1 tablet (81 mg total) by mouth daily. Swallow whole., Disp: 360 tablet, Rfl: 0   atorvastatin (LIPITOR) 80 MG tablet, Take 1 tablet (80 mg total) by mouth at bedtime., Disp: 90 tablet, Rfl: 3   Biotin 10 MG TABS, Take 10 mg by mouth daily., Disp: , Rfl:    Blood Glucose Monitoring Suppl (ONETOUCH VERIO) w/Device KIT, Use to check blood sugar, Disp: 1 kit, Rfl: 0   carvedilol (COREG) 3.125 MG tablet, Take 1 tablet (3.125 mg total) by mouth 2 (two) times daily with a meal., Disp: 60 tablet, Rfl: 0   celecoxib (CELEBREX) 100 MG capsule, Take 100 mg by mouth 2 (two) times daily., Disp: , Rfl:    clotrimazole-betamethasone (LOTRISONE) lotion, Apply 1 application topically 2 (two) times daily as needed (itching)., Disp: , Rfl:    Continuous Blood Gluc Sensor (DEXCOM G6 SENSOR) MISC, 1 Device by Does not apply route as directed., Disp: 9 each, Rfl: 3   empagliflozin (JARDIANCE) 10 MG TABS tablet, Take 1 tablet (10 mg total) by mouth daily., Disp: 30 tablet, Rfl: 11   Ferrous Sulfate (IRON) 325 (65 Fe) MG TABS, Take 1 tablet (325 mg total) by mouth 2 (two) times daily before a meal., Disp: 180 tablet, Rfl: 3    folic acid (FOLVITE) 1 MG tablet, Take 1 tablet (1 mg total) by mouth daily., Disp: 30 tablet, Rfl: 2   gabapentin (NEURONTIN) 300 MG capsule, Take 300 mg by mouth 2 (two) times daily., Disp: , Rfl:    Insulin Glargine (BASAGLAR KWIKPEN) 100 UNIT/ML, Inject 36 Units into the skin at bedtime., Disp: 45 mL, Rfl: 0   insulin lispro (HUMALOG) 100 UNIT/ML injection, Inject 0.05 mLs (5 Units total) into the skin 3 (three) times daily before meals., Disp: 10 mL, Rfl: 0   Lancets (ONETOUCH DELICA PLUS LANCET33G) MISC, USE   TO CHECK GLUCOSE 4 TIMES DAILY, Disp: 100 each, Rfl: 3   latanoprost (XALATAN) 0.005 % ophthalmic solution, Place 1 drop into both eyes at bedtime., Disp: , Rfl:    levothyroxine (SYNTHROID, LEVOTHROID) 75 MCG tablet, Take 75 mcg by mouth daily before breakfast., Disp: , Rfl:    losartan (COZAAR)  25 MG tablet, Take 1 tablet (25 mg total) by mouth daily., Disp: 30 tablet, Rfl: 6   LUMIGAN 0.01 % SOLN, Place 1 drop into both eyes at bedtime., Disp: , Rfl:    Multiple Vitamins-Minerals (ALIVE ONCE DAILY WOMENS 50+ PO), Take 1 tablet by mouth daily., Disp: , Rfl:    spironolactone (ALDACTONE) 25 MG tablet, Take 0.5 tablets (12.5 mg total) by mouth daily., Disp: 45 tablet, Rfl: 3   torsemide (DEMADEX) 20 MG tablet, Take 3 tablets by mouth daily, Disp: 90 tablet, Rfl: 3   Travoprost, BAK Free, (TRAVATAN) 0.004 % SOLN ophthalmic solution, Place 1 drop into both eyes at bedtime., Disp: , Rfl:  Allergies  Allergen Reactions   Codeine Other (See Comments)   Other Other (See Comments)    Blood Product Refusal (Jehovah's witness)     Tape Other (See Comments)    Leaves blisters and marks on the skin   Sulfa Antibiotics Rash   Uloric [Febuxostat] Rash     Social History   Socioeconomic History   Marital status: Widowed    Spouse name: Not on file   Number of children: 3   Years of education: Not on file   Highest education level: Some college, no degree  Occupational History    Occupation: retired  Tobacco Use   Smoking status: Former    Packs/day: 1.00    Years: 22.00    Additional pack years: 0.00    Total pack years: 22.00    Types: Cigarettes    Quit date: 52    Years since quitting: 41.3   Smokeless tobacco: Never  Vaping Use   Vaping Use: Never used  Substance and Sexual Activity   Alcohol use: Yes    Comment: 05/22/2018 "glass of wine couple times/wk"   Drug use: Not Currently   Sexual activity: Not Currently    Birth control/protection: None, Post-menopausal  Other Topics Concern   Not on file  Social History Narrative   Not on file   Social Determinants of Health   Financial Resource Strain: Medium Risk (12/22/2021)   Overall Financial Resource Strain (CARDIA)    Difficulty of Paying Living Expenses: Somewhat hard  Food Insecurity: No Food Insecurity (05/08/2018)   Hunger Vital Sign    Worried About Running Out of Food in the Last Year: Never true    Ran Out of Food in the Last Year: Never true  Transportation Needs: Unmet Transportation Needs (05/08/2022)   PRAPARE - Administrator, Civil Service (Medical): Yes    Lack of Transportation (Non-Medical): Yes  Physical Activity: Not on file  Stress: Not on file  Social Connections: Not on file  Intimate Partner Violence: Not on file    Physical Exam      Future Appointments  Date Time Provider Department Center  09/17/2022 10:15 AM Louann Sjogren, DPM [facility]  09/19/2022 12:00 PM [facility] PA/NP [facility] None  11/28/2022  2:00 PM [facility] DEVICE REMOTES [facility]  01/16/2023 10:00 AM [facility] 6 - [facility]  01/16/2023 10:30 AM [facility] - [facility]  01/16/2023 11:20 AM Maeola Harman, MD [facility]  02/22/2023 11:30 AM Shamleffer, Konrad Dolores, MD [facility] None  02/27/2023  2:00 PM [facility] DEVICE REMOTES [facility]  05/30/2023  7:00 AM [facility] DEVICE REMOTES [facility]   08/28/2023  2:00 PM [facility] DEVICE REMOTES [facility]  11/27/2023  2:00 PM [facility] DEVICE REMOTES [facility]  02/26/2024  2:00 PM [facility] DEVICE REMOTES [facility]  05/27/2024  2:00 PM [facility] DEVICE REMOTES [facility]

## 2022-09-06 NOTE — Telephone Encounter (Signed)
Heather with para medicine called during patients home visit to report an additional 6 lbs weight gain despite increase in torsemide a few weeks ago.  Heather requested OV for further evaluation and medication adjustments if appropriate  Scheduled for 4/24 @ 12  Please advise

## 2022-09-07 NOTE — Telephone Encounter (Signed)
Attempted to contact patient to schedule lab appt  [facility]

## 2022-09-07 NOTE — Telephone Encounter (Signed)
Pt returned call  Labs scheduled 4/16 Message to para med as fyi

## 2022-09-08 DIAGNOSIS — E1165 Type 2 diabetes mellitus with hyperglycemia: Secondary | ICD-10-CM | POA: Diagnosis not present

## 2022-09-10 ENCOUNTER — Other Ambulatory Visit: Payer: Self-pay

## 2022-09-10 DIAGNOSIS — I70222 Atherosclerosis of native arteries of extremities with rest pain, left leg: Secondary | ICD-10-CM

## 2022-09-11 ENCOUNTER — Ambulatory Visit (HOSPITAL_COMMUNITY)
Admission: RE | Admit: 2022-09-11 | Discharge: 2022-09-11 | Disposition: A | Discharge status: Home / Self Care | Payer: Medicare HMO | Source: Ambulatory Visit | Attending: Internal Medicine | Admitting: Internal Medicine

## 2022-09-11 DIAGNOSIS — I5032 Chronic diastolic (congestive) heart failure: Secondary | ICD-10-CM | POA: Diagnosis not present

## 2022-09-11 LAB — BRAIN NATRIURETIC PEPTIDE: B Natriuretic Peptide: 104.4 pg/mL — ABNORMAL HIGH (ref 0.0–100.0)

## 2022-09-11 LAB — BASIC METABOLIC PANEL
Anion gap: 12 (ref 5–15)
BUN: 60 mg/dL — ABNORMAL HIGH (ref 8–23)
CO2: 27 mmol/L (ref 22–32)
Calcium: 9.1 mg/dL (ref 8.9–10.3)
Chloride: 94 mmol/L — ABNORMAL LOW (ref 98–111)
Creatinine, Ser: 2.22 mg/dL — ABNORMAL HIGH (ref 0.44–1.00)
GFR, Estimated: 22 mL/min — ABNORMAL LOW (ref >60)
Glucose, Bld: 304 mg/dL — ABNORMAL HIGH (ref 70–99)
Potassium: 4.5 mmol/L (ref 3.5–5.1)
Sodium: 133 mmol/L — ABNORMAL LOW (ref 135–145)

## 2022-09-12 ENCOUNTER — Telehealth (HOSPITAL_COMMUNITY): Payer: Self-pay

## 2022-09-12 MED ORDER — TORSEMIDE 20 MG PO TABS: 40.0000 mg | ORAL_TABLET | Freq: Every day | ORAL | 3 refills | Status: DC

## 2022-09-12 NOTE — Telephone Encounter (Signed)
Patient advised and verbalized understanding. Med list updated to reflect changes.   Meds ordered this encounter  Medications   torsemide (DEMADEX) 20 MG tablet    Sig: Take 2 tablets (40 mg total) by mouth daily.    Dispense:  90 tablet    Refill:  3    Please cancel all previous orders for current medication. Change in dosage or pill size.

## 2022-09-12 NOTE — Telephone Encounter (Signed)
-----   Message from Allayne Butcher, [place] sent at 09/12/2022  4:55 PM EDT ----- Labs suggest dehydration. Hold torsemide tomorrow then restart at lower dose at 40 mg daily on Friday. F/u w/ APP

## 2022-09-12 NOTE — Telephone Encounter (Signed)
Brittainy Sherlynn Carbon, PA-C 09/12/2022  4:55 PM EDT Back to Top    Labs suggest dehydration. Hold torsemide tomorrow then restart at lower dose at 40 mg daily on Friday. F/u w/ APP             Spoke to [facility] reference lab results and advised her to hold her torsemide tomorrow morning- I will follow up with a home visit tomorrow afternoon, she agreed and knows what pills her torsemides are and removed them from her box and placed back in pill bottle. I will see her tomorrow. Call complete.   [name], EMT-Paramedic [phone] 09/12/2022

## 2022-09-13 ENCOUNTER — Other Ambulatory Visit (HOSPITAL_COMMUNITY): Payer: Self-pay

## 2022-09-13 NOTE — Progress Notes (Signed)
Paramedicine Encounter    Patient ID: Danielle [name], female    DOB: [birth year], 77 y.o.   MRN: [id number]   Complaints- lower back pain    Assessment- CAOX4, warm and dry, lungs clear, lower leg swelling improved, no chest pain, no shortness of breath, complaining of some dizziness says it is her inner ear she has trouble with during seasonal allergies   Compliance with meds- some missed doses over the last week   Pill box filled- for one week   Refills needed- carvedilol, torsemide, losartan, gabapentin   Meds changes since last visit- torsemide decreased to  daily     Social changes- none    BP 102/64   Pulse 74   Resp 18   Wt 212 lb (96.2 kg)   SpO2 96%   BMI 36.39 kg/m  Weight yesterday-211lbs Last visit weight--210lbs  CBG- 289 (gave 6 units of humalog at 1400)   Arrived for home visit for [facility] who reports to be feeling okay just having some lower back pain with inner ear pain and some dizziness. This is not abnormal for her. She denied chest pain, shortness of breath. Some weight gain since last week 2lbs. Leg edema has improved. She reports she is urinating well. She had missed a few doses of her medications over the last week. I reviewed meds and filled pill box for one week. She was reminded of med change and agreed to same. I reviewed appointments and wrote down same with times and addresses so she could set up transportation. We discussed HF and diabetes management. I replaced her Dexcom today and assisted her with taking her afternoon dose of humalog. Home visit complete. I plan to meet her in clinic next week. She agreed with plan.    [name], EMT-Paramedic [phone]  ACTION: Home visit completed    Patient Care Team: Renford Dills, MD as PCP - General (Internal Medicine) Regan Lemming, MD as PCP - Electrophysiology (Cardiology) Quintella Reichert, MD as PCP - Sleep Medicine (Cardiology) Bensimhon, Bevelyn Buckles, MD as PCP - Cardiology  (Cardiology) Burna Sis, LCSW as Social Worker (Licensed Clinical Social Worker) Shamleffer, Konrad Dolores, MD as Consulting Physician (Endocrinology)  Patient Active Problem List   Diagnosis Date Noted   Chronic systolic heart failure 05/08/2022   Type 2 diabetes mellitus with stage 3a chronic kidney disease, with long-term current use of insulin 02/16/2022   Prosthetic joint implant failure, initial encounter 12/20/2021   Pseudophakia of both eyes 11/15/2021   DVT (deep venous thrombosis) 07/31/2021   Acute blood loss anemia    Septic Arthritis, Infection of prosthetic right knee joint (HCC), MSSA infection 07/10/2021   Complication of internal right knee prosthesis 07/10/2021   CAD (coronary artery disease) 06/08/2021   Carotid artery disease 06/08/2021   Symptomatic bradycardia 06/08/2021   Paroxysmal atrial fibrillation 06/08/2021   CKD (chronic kidney disease), stage III 06/08/2021   Polyneuropathy associated with underlying disease 05/04/2020   Severe nonproliferative diabetic retinopathy of left eye, with macular edema, associated with type 2 diabetes mellitus 11/05/2019   Severe nonproliferative diabetic retinopathy of right eye, with macular edema, associated with type 2 diabetes mellitus 11/05/2019   Retinal hemorrhage of right eye 11/05/2019   Retinal hemorrhage of left eye 11/05/2019   Diabetes mellitus 05/14/2019   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, with long-term current use of insulin 05/14/2019   Type 2 diabetes mellitus with diabetic polyneuropathy, with long-term current use of insulin 05/14/2019   History  of gout 05/20/2018   Primary localized osteoarthritis of right knee 05/13/2018   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) 12/20/2017   Obesity (BMI 30-39.9) 11/11/2017   OSA (obstructive sleep apnea) 02/18/2017   Essential hypertension    L Breast Cancer    Hypothyroidism     Current Outpatient Medications:    acetaminophen  (TYLENOL) 500 MG tablet, Take 500-1,000 mg by mouth every 6 (six) hours as needed (for pain or headaches)., Disp: , Rfl:    allopurinol (ZYLOPRIM) 100 MG tablet, Take 100 mg by mouth daily., Disp: , Rfl:    apixaban (ELIQUIS) 5 MG TABS tablet, Take 1 tablet (5 mg total) by mouth 2 (two) times daily., Disp: 60 tablet, Rfl: 11   aspirin EC 81 MG tablet, Take 1 tablet (81 mg total) by mouth daily. Swallow whole., Disp: 360 tablet, Rfl: 0   atorvastatin (LIPITOR) 80 MG tablet, Take 1 tablet (80 mg total) by mouth at bedtime., Disp: 90 tablet, Rfl: 3   Biotin 10 MG TABS, Take 10 mg by mouth daily., Disp: , Rfl:    Blood Glucose Monitoring Suppl (ONETOUCH VERIO) w/Device KIT, Use to check blood sugar, Disp: 1 kit, Rfl: 0   carvedilol (COREG) 3.125 MG tablet, Take 1 tablet (3.125 mg total) by mouth 2 (two) times daily with a meal., Disp: 60 tablet, Rfl: 0   celecoxib (CELEBREX) 100 MG capsule, Take 100 mg by mouth 2 (two) times daily., Disp: , Rfl:    clotrimazole-betamethasone (LOTRISONE) lotion, Apply 1 application topically 2 (two) times daily as needed (itching)., Disp: , Rfl:    Continuous Blood Gluc Sensor (DEXCOM G6 SENSOR) MISC, 1 Device by Does not apply route as directed., Disp: 9 each, Rfl: 3   empagliflozin (JARDIANCE) 10 MG TABS tablet, Take 1 tablet (10 mg total) by mouth daily., Disp: 30 tablet, Rfl: 11   Ferrous Sulfate (IRON) 325 (65 Fe) MG TABS, Take 1 tablet (325 mg total) by mouth 2 (two) times daily before a meal., Disp: 180 tablet, Rfl: 3   folic acid (FOLVITE) 1 MG tablet, Take 1 tablet (1 mg total) by mouth daily., Disp: 30 tablet, Rfl: 2   gabapentin (NEURONTIN) 300 MG capsule, Take 300 mg by mouth 2 (two) times daily., Disp: , Rfl:    Insulin Glargine (BASAGLAR KWIKPEN) 100 UNIT/ML, Inject 36 Units into the skin at bedtime., Disp: 45 mL, Rfl: 0   insulin lispro (HUMALOG) 100 UNIT/ML injection, Inject 0.05 mLs (5 Units total) into the skin 3 (three) times daily before meals., Disp:  10 mL, Rfl: 0   Lancets (ONETOUCH DELICA PLUS LANCET33G) MISC, USE   TO CHECK GLUCOSE 4 TIMES DAILY, Disp: 100 each, Rfl: 3   latanoprost (XALATAN) 0.005 % ophthalmic solution, Place 1 drop into both eyes at bedtime., Disp: , Rfl:    levothyroxine (SYNTHROID, LEVOTHROID) 75 MCG tablet, Take 75 mcg by mouth daily before breakfast., Disp: , Rfl:    losartan (COZAAR) 25 MG tablet, Take 1 tablet (25 mg total) by mouth daily., Disp: 30 tablet, Rfl: 6   LUMIGAN 0.01 % SOLN, Place 1 drop into both eyes at bedtime., Disp: , Rfl:    Multiple Vitamins-Minerals (ALIVE ONCE DAILY WOMENS 50+ PO), Take 1 tablet by mouth daily., Disp: , Rfl:    spironolactone (ALDACTONE) 25 MG tablet, Take 0.5 tablets (12.5 mg total) by mouth daily., Disp: 45 tablet, Rfl: 3   torsemide (DEMADEX) 20 MG tablet, Take 2 tablets (40 mg total) by mouth daily.,  Disp: 90 tablet, Rfl: 3   Travoprost, BAK Free, (TRAVATAN) 0.004 % SOLN ophthalmic solution, Place 1 drop into both eyes at bedtime., Disp: , Rfl:  Allergies  Allergen Reactions   Codeine Other (See Comments)   Other Other (See Comments)    Blood Product Refusal (Jehovah's witness)     Tape Other (See Comments)    Leaves blisters and marks on the skin   Sulfa Antibiotics Rash   Uloric [Febuxostat] Rash     Social History   Socioeconomic History   Marital status: Widowed    Spouse name: Not on file   Number of children: 3   Years of education: Not on file   Highest education level: Some college, no degree  Occupational History   Occupation: retired  Tobacco Use   Smoking status: Former    Packs/day: 1.00    Years: 22.00    Additional pack years: 0.00    Total pack years: 22.00    Types: Cigarettes    Quit date: 18    Years since quitting: 41.3   Smokeless tobacco: Never  Vaping Use   Vaping Use: Never used  Substance and Sexual Activity   Alcohol use: Yes    Comment: 05/22/2018 "glass of wine couple times/wk"   Drug use: Not Currently   Sexual  activity: Not Currently    Birth control/protection: None, Post-menopausal  Other Topics Concern   Not on file  Social History Narrative   Not on file   Social Determinants of Health   Financial Resource Strain: Medium Risk (12/22/2021)   Overall Financial Resource Strain (CARDIA)    Difficulty of Paying Living Expenses: Somewhat hard  Food Insecurity: No Food Insecurity (05/08/2018)   Hunger Vital Sign    Worried About Running Out of Food in the Last Year: Never true    Ran Out of Food in the Last Year: Never true  Transportation Needs: Unmet Transportation Needs (05/08/2022)   PRAPARE - Administrator, Civil Service (Medical): Yes    Lack of Transportation (Non-Medical): Yes  Physical Activity: Not on file  Stress: Not on file  Social Connections: Not on file  Intimate Partner Violence: Not on file    Physical Exam      Future Appointments  Date Time Provider Department Center  09/17/2022 10:15 AM Louann Sjogren, DPM [facility]  09/19/2022 12:00 PM [facility] PA/NP [facility] None  11/28/2022  2:00 PM [facility] DEVICE REMOTES [facility]  01/16/2023 10:00 AM [facility] VASC 6 - [facility]  01/16/2023 10:30 AM [facility] 6 - [facility]  01/16/2023 11:20 AM Maeola Harman, MD [facility]  02/22/2023 11:30 AM Shamleffer, Konrad Dolores, MD [facility] None  02/27/2023  2:00 PM [facility] DEVICE REMOTES [facility]  05/30/2023  7:00 AM [facility] DEVICE REMOTES [facility]  08/28/2023  2:00 PM [facility] DEVICE REMOTES [facility]  11/27/2023  2:00 PM [facility] DEVICE REMOTES [facility]  02/26/2024  2:00 PM [facility] DEVICE REMOTES [facility]  05/27/2024  2:00 PM [facility] DEVICE REMOTES [facility]

## 2022-09-14 ENCOUNTER — Other Ambulatory Visit (HOSPITAL_COMMUNITY): Payer: Self-pay

## 2022-09-14 MED ORDER — CARVEDILOL 3.125 MG PO TABS: 3.1250 mg | ORAL_TABLET | Freq: Two times a day (BID) | ORAL | 0 refills | Status: DC

## 2022-09-14 MED ORDER — LOSARTAN POTASSIUM 25 MG PO TABS: 25.0000 mg | ORAL_TABLET | Freq: Every day | ORAL | 6 refills | Status: DC

## 2022-09-17 ENCOUNTER — Ambulatory Visit: Payer: Medicare HMO | Admitting: Podiatry

## 2022-09-17 NOTE — Progress Notes (Deleted)
[facility] Note   PCP: Renford Dills, MD EP: Dr. Elberta Fortis Rheumatology: Dr. Lennette Bihari HF Cardiologist: Dr. Gala Romney  HPI: Danielle [name] is a 77 y.o.female with a past medical history of systolic HF (EF 45-50%), CAD s/p multiple stents in [place] (with h/o LAD stent thrombosis), ischemic cardiomyopathy (EF 10% at one point), symptomatic bradycardia s/p PPM, HTN, DM, hypothyroidism, and OSA.   Admitted 9/18 with SOB, acute on chronic systolic CHF. Diuresed with IV lasix (12 pounds), discharge weight was 215 pounds.    Had R TKR 12/19.    Admitted 11/22 with right knee septic arthritis. Arthrocentesis culture grew staph aureus.  Underwent I&D and polyexchange of infected right total knee arthroplasty.  Treated with 6 weeks IV antibiotics followed by suppressive abx. Aldactone held, losartan reduced to 25 mg daily and coreg reduced to 12.5 mg BID d/t soft BP. She was given 1 dose of furosemide on admission due to leg edema but this was held later due to AKI.  Torsemide was resumed at discharge.  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  Patient underwent explant right total knee arthroplasty with implantation of articulating antibiotic spacer by Dr. Blanchie Dessert on 07/12/2021. Placed on IV abx. Was discharged to SNF. During hospitalization was very anemic, but improving.   Echo 2/23 EF 40-45%   Follow up 4/23, unsure what meds she was taking. Re-enrolled in paramedicine.   Had knee re-replaced on 12/20/21  Today she returns for HF follow up with [name], paramedic. Overall feeling fine. She has LE swelling and took extra torsemide last week, leg swelling improved. She walks with her rolling walker, no SOB walking on flat ground. Main complaint is fatigue. Denies palpitations, abnormal bleeding, CP, dizziness, or PND/Orthopnea. Appetite ok. No fever or chills. Weight at home 207 pounds. Taking all medications. Wears CPAP nightly.  Echo  today 05/08/22 shows EF 45-50%, mildly decreased LV with moderate LVH, grade II DD, RV ok, mild MR with severe MAC.   Cardiac Studies: Echo (9/18): EF 45-50% Grade 2DD Left Atrium Severely dilated.  Echo (8/19): EF 45-50%, G2DD, LA mildly dilated, trileaflet moderately thick aortic valve Echo (12/21): EF 45-50% G2DD  Echo (2/23): EF 40-45%  Echo (12/23): EF 45-50%  ROS: All systems negative except as listed in HPI, PMH and Problem List.  SH:  Social History   Socioeconomic History   Marital status: Widowed    Spouse name: Not on file   Number of children: 3   Years of education: Not on file   Highest education level: Some college, no degree  Occupational History   Occupation: retired  Tobacco Use   Smoking status: Former    Packs/day: 1.00    Years: 22.00    Additional pack years: 0.00    Total pack years: 22.00    Types: Cigarettes    Quit date: 45    Years since quitting: 41.3   Smokeless tobacco: Never  Vaping Use   Vaping Use: Never used  Substance and Sexual Activity   Alcohol use: Yes    Comment: 05/22/2018 "glass of wine couple times/wk"   Drug use: Not Currently   Sexual activity: Not Currently    Birth control/protection: None, Post-menopausal  Other Topics Concern   Not on file  Social History Narrative   Not on file   Social Determinants of Health   Financial Resource Strain: Medium Risk (12/22/2021)   Overall Financial Resource Strain (CARDIA)    Difficulty of Paying  Living Expenses: Somewhat hard  Food Insecurity: No Food Insecurity (05/08/2018)   Hunger Vital Sign    Worried About Running Out of Food in the Last Year: Never true    Ran Out of Food in the Last Year: Never true  Transportation Needs: Unmet Transportation Needs (05/08/2022)   PRAPARE - Administrator, Civil Service (Medical): Yes    Lack of Transportation (Non-Medical): Yes  Physical Activity: Not on file  Stress: Not on file  Social Connections: Not on file   Intimate Partner Violence: Not on file    FH:  Family History  Problem Relation Age of Onset   Multiple myeloma Mother    Heart disease Father    Other Sister    Heart disease Brother    Diabetes Sister    Other Sister     Past Medical History:  Diagnosis Date   Acute combined systolic and diastolic congestive heart failure 02/05/2017   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 05/20/2018   Acute kidney failure 05/15/2018   Arthritis    "all over" (05/22/2018)   Blood dyscrasia    per pt-has small blood cells-appears as if anemic   Breast cancer, left breast 1985   CHF (congestive heart failure)    Complication of anesthesia    difficult to awaken from per pt   Coronary artery disease    Dyspnea    Essential hypertension    Heart disease    Heart murmur    History of gout    Hypothyroidism    Obesity (BMI 30-39.9) 11/11/2017   OSA (obstructive sleep apnea) 02/18/2017    severe obstructive sleep apnea with an AHI of 75.6/h and mild central sleep apnea with a CAI of 7.7/h.  Oxygen saturations dropped as low as 82%.   He is on CPAP at 9 cm H2O.   OSA on CPAP    Personal history of chemotherapy    Personal history of radiation therapy    Presence of permanent cardiac pacemaker    Primary localized osteoarthritis of right knee 05/13/2018   Refusal of blood transfusions as patient is Jehovah's Witness    Type II diabetes mellitus    Current Outpatient Medications  Medication Sig Dispense Refill   acetaminophen (TYLENOL) 500 MG tablet Take 500-1,000 mg by mouth every 6 (six) hours as needed (for pain or headaches).     allopurinol (ZYLOPRIM) 100 MG tablet Take 100 mg by mouth daily.     apixaban (ELIQUIS) 5 MG TABS tablet Take 1 tablet (5 mg total) by mouth 2 (two) times daily. 60 tablet 11   aspirin EC 81 MG tablet Take 1 tablet (81 mg total) by mouth daily. Swallow whole. 360 tablet 0   atorvastatin (LIPITOR) 80 MG tablet Take 1 tablet (80 mg total) by  mouth at bedtime. 90 tablet 3   Biotin 10 MG TABS Take 10 mg by mouth daily.     Blood Glucose Monitoring Suppl (ONETOUCH VERIO) w/Device KIT Use to check blood sugar 1 kit 0   carvedilol (COREG) 3.125 MG tablet Take 1 tablet (3.125 mg total) by mouth 2 (two) times daily with a meal. 60 tablet 0   celecoxib (CELEBREX) 100 MG capsule Take 100 mg by mouth 2 (two) times daily.     clotrimazole-betamethasone (LOTRISONE) lotion Apply 1 application topically 2 (two) times daily as needed (itching).     Continuous Blood Gluc Sensor (DEXCOM G6 SENSOR) MISC 1 Device by Does not  apply route as directed. 9 each 3   empagliflozin (JARDIANCE) 10 MG TABS tablet Take 1 tablet (10 mg total) by mouth daily. 30 tablet 11   Ferrous Sulfate (IRON) 325 (65 Fe) MG TABS Take 1 tablet (325 mg total) by mouth 2 (two) times daily before a meal. 180 tablet 3   folic acid (FOLVITE) 1 MG tablet Take 1 tablet (1 mg total) by mouth daily. 30 tablet 2   gabapentin (NEURONTIN) 300 MG capsule Take 300 mg by mouth 2 (two) times daily.     Insulin Glargine (BASAGLAR KWIKPEN) 100 UNIT/ML Inject 36 Units into the skin at bedtime. 45 mL 0   insulin lispro (HUMALOG) 100 UNIT/ML injection Inject 0.05 mLs (5 Units total) into the skin 3 (three) times daily before meals. 10 mL 0   Lancets (ONETOUCH DELICA PLUS LANCET33G) MISC USE   TO CHECK GLUCOSE 4 TIMES DAILY 100 each 3   latanoprost (XALATAN) 0.005 % ophthalmic solution Place 1 drop into both eyes at bedtime.     levothyroxine (SYNTHROID, LEVOTHROID) 75 MCG tablet Take 75 mcg by mouth daily before breakfast.     losartan (COZAAR) 25 MG tablet Take 1 tablet (25 mg total) by mouth daily. 30 tablet 6   LUMIGAN 0.01 % SOLN Place 1 drop into both eyes at bedtime.     Multiple Vitamins-Minerals (ALIVE ONCE DAILY WOMENS 50+ PO) Take 1 tablet by mouth daily.     spironolactone (ALDACTONE) 25 MG tablet Take 0.5 tablets (12.5 mg total) by mouth daily. 45 tablet 3   torsemide (DEMADEX) 20 MG  tablet Take 2 tablets (40 mg total) by mouth daily. 90 tablet 3   Travoprost, BAK Free, (TRAVATAN) 0.004 % SOLN ophthalmic solution Place 1 drop into both eyes at bedtime.     No current facility-administered medications for this visit.   There were no vitals taken for this visit.  Wt Readings from Last 3 Encounters:  09/13/22 96.2 kg (212 lb)  09/06/22 98.4 kg (217 lb)  09/05/22 98.4 kg (217 lb)   PHYSICAL EXAM: General:  NAD. No resp difficulty, walked into clinic with rolling walker HEENT: Normal Neck: Supple. No JVD. Carotids 2+ bilat; no bruits. No lymphadenopathy or thryomegaly appreciated. Cor: PMI nondisplaced. Regular rate & rhythm. No rubs, gallops or murmurs. Lungs: Clear Abdomen: Soft, nontender, nondistended. No hepatosplenomegaly. No bruits or masses. Good bowel sounds. Extremities: No cyanosis, clubbing, rash, 1-2+ BLE edema to knees, R>L Neuro: Alert & oriented x 3, cranial nerves grossly intact. Moves all 4 extremities w/o difficulty. Affect pleasant.  MDT Device interrogation (personally reviewed): < 1% v-pacing, no AF, 1 hr/day activity.   ASSESSMENT & PLAN: 1. Chronic systolic CHF: ICM - Echo (12/21): EF 45-50% G2DD  - Echo (2/23): EF 40-45%  - Echo today 05/08/22 with EF 45-50%, moderate LVH grade II DD, RV normal, severe MAC. We discussed results today. - Stable NYHA II. Volume looks ok, has mild LE swelling. - Given Rx for compression hose. - Continue torsemide 20 mg daily. Can take extra 20 mg for next 3 days to help with leg swelling. - Continue spiro 12.5 mg daily. - Continue losartan 25 mg daily.  - Continue carvedilol 3.125 mg bid. - Continue Jardiance 10 mg daily. - Labs today.  2. CAD:  - Stenting in 2010, 2012 and 2016.  - No s/s ischemia - Continue statin.  - No ASA with Eliquis.   3. Paroxysmal Afib - Regular on exam today. No AF on device  interrogation. - Continue Eliquis 5 mg bid. No bleeding. - CBC today (She is Jehovah's Witness).    4. HTN - BP stable. - Continue current medications.    5. DM - Per Endocrine.  - On insulin. - Continue Jardiance. No GU symptoms.   6. H/o sinus pauses s/p PPM - Dr. Elberta Fortis now following.  - No change.    7. OSA - Continue CPAP nightly.  - Follows with Dr. Mayford Knife.   8. CKD 3b - Baseline SCr ~ 1.4. - Continue Jardiance. - Labs today   9. 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 Fatigue - likely multifactorial - Check iron studies today.  Follow up in 4 months with Dr. Gala Romney  Jacklynn Ganong, FNP  12:12 PM

## 2022-09-18 ENCOUNTER — Ambulatory Visit: Payer: Medicare HMO | Admitting: Podiatry

## 2022-09-18 ENCOUNTER — Encounter: Payer: Self-pay | Admitting: Podiatry

## 2022-09-18 DIAGNOSIS — E1151 Type 2 diabetes mellitus with diabetic peripheral angiopathy without gangrene: Secondary | ICD-10-CM | POA: Diagnosis not present

## 2022-09-18 DIAGNOSIS — Z87898 Personal history of other specified conditions: Secondary | ICD-10-CM

## 2022-09-18 DIAGNOSIS — M86672 Other chronic osteomyelitis, left ankle and foot: Secondary | ICD-10-CM | POA: Diagnosis not present

## 2022-09-18 DIAGNOSIS — L84 Corns and callosities: Secondary | ICD-10-CM | POA: Diagnosis not present

## 2022-09-18 NOTE — Progress Notes (Signed)
Subjective:  Patient ID: Danielle [name], female    DOB: Nov 06, 1945,   MRN: [id number]  Chief Complaint  Patient presents with   Foot Ulcer    Follow up ulcer 2nd toe left   "Its okay, still dark and swollen"    77 y.o. female presents for follow-up of left second toe wound. Marland Kitchen Has been dressing as instructed .Finished antibiotics.  Relates toe doing well. No pain but still discolored.  Underwent vascular procedure 3/4.   Denies any other pedal complaints. Denies n/v/f/c.   Past Medical History:  Diagnosis Date   Acute combined systolic and diastolic congestive heart failure 02/05/2017   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 05/20/2018   Acute kidney failure 05/15/2018   Arthritis    "all over" (05/22/2018)   Blood dyscrasia    per pt-has small blood cells-appears as if anemic   Breast cancer, left breast 1985   CHF (congestive heart failure)    Complication of anesthesia    difficult to awaken from per pt   Coronary artery disease    Dyspnea    Essential hypertension    Heart disease    Heart murmur    History of gout    Hypothyroidism    Obesity (BMI 30-39.9) 11/11/2017   OSA (obstructive [name] apnea) 02/18/2017    severe obstructive [name] apnea with an AHI of 75.6/h and mild central [name] apnea with a CAI of 7.7/h.  Oxygen saturations dropped as low as 82%.   He is on CPAP at 9 cm H2O.   OSA on CPAP    Personal history of chemotherapy    Personal history of radiation therapy    Presence of permanent cardiac pacemaker    Primary localized osteoarthritis of right knee 05/13/2018   Refusal of blood transfusions as patient is Jehovah's Witness    Type II diabetes mellitus     Objective:  Physical Exam: Vascular: DP/PT pulses 2/4 bilateral. CFT <3 seconds. Absent hair growth on digits. Edema noted to bilateral lower extremities. Xerosis noted bilaterally.  Skin. No lacerations or abrasions bilateral feet. Nails 1-5 bilateral  are thickened discolored and   with subungual debris. Left second digit ulceration Healed. Mild discoloration noted to the second digit. No purulence. Does not  probe to bone today.  Musculoskeletal: MMT 5/5 bilateral lower extremities in DF, PF, Inversion and Eversion. Deceased ROM in DF of ankle joint.  Neurological: Sensation intact to light touch. Protective sensation diminished bilateral.   MRI left foot:  IMPRESSION: 1. Ulceration at the tip of the second toe with underlying osteomyelitis of the second distal phalanx.  Assessment:   1. Pre-ulcerative calluses   2. History of ulceration   3. Type II diabetes mellitus with peripheral circulatory disorder   4. Chronic osteomyelitis involving left ankle and foot           Plan:  Patient was evaluated and treated and all questions answered. Ulcer left second digit - healed, chronic necrosis of bone.  -Debridement of hyperkeratatotic tissue with underlying ulcer healed.  -Dressed with bandaid for protection -Off-loading with surgical shoe for one more week.  Vascular procedure preformed on 3/4  MRI showing osteomyelitis of the distal tip of the phalanx of the second digit. Discussed these results with patient. Likely feel this is more a chronic osteomyelitis as she has improved. Will continue to monitor and discussed need for possible amputation in the future.  -Did discuss travel and patient will be in [place]  for a month. Advised if any changes to head to ED up there.  -Discussed glucose control and proper protein-rich diet.  -Discussed if any worsening redness, pain, fever or chills to call or may need to report to the emergency room. Patient expressed understanding.     Return in 6 weeks after trip for wound recheck.   Return in about 6 weeks (around 10/30/2022) for wound check.    Return in about 6 weeks (around 10/30/2022) for wound check.   Danielle [name], DPM

## 2022-09-19 ENCOUNTER — Encounter (HOSPITAL_COMMUNITY): Payer: Self-pay

## 2022-09-19 ENCOUNTER — Ambulatory Visit (HOSPITAL_COMMUNITY)
Admission: RE | Admit: 2022-09-19 | Discharge: 2022-09-19 | Disposition: A | Discharge status: Home / Self Care | Payer: Medicare HMO | Source: Ambulatory Visit | Attending: Adult Health | Admitting: Adult Health

## 2022-09-19 ENCOUNTER — Other Ambulatory Visit (HOSPITAL_COMMUNITY): Payer: Self-pay

## 2022-09-19 VITALS — BP 115/57 | HR 73 | Wt 218.2 lb

## 2022-09-19 DIAGNOSIS — I5022 Chronic systolic (congestive) heart failure: Secondary | ICD-10-CM | POA: Diagnosis not present

## 2022-09-19 DIAGNOSIS — E039 Hypothyroidism, unspecified: Secondary | ICD-10-CM | POA: Diagnosis not present

## 2022-09-19 DIAGNOSIS — Z950 Presence of cardiac pacemaker: Secondary | ICD-10-CM | POA: Insufficient documentation

## 2022-09-19 DIAGNOSIS — Z7901 Long term (current) use of anticoagulants: Secondary | ICD-10-CM | POA: Diagnosis not present

## 2022-09-19 DIAGNOSIS — Z794 Long term (current) use of insulin: Secondary | ICD-10-CM | POA: Insufficient documentation

## 2022-09-19 DIAGNOSIS — G4733 Obstructive sleep apnea (adult) (pediatric): Secondary | ICD-10-CM | POA: Diagnosis not present

## 2022-09-19 DIAGNOSIS — E1122 Type 2 diabetes mellitus with diabetic chronic kidney disease: Secondary | ICD-10-CM | POA: Insufficient documentation

## 2022-09-19 DIAGNOSIS — Z96651 Presence of right artificial knee joint: Secondary | ICD-10-CM | POA: Diagnosis not present

## 2022-09-19 DIAGNOSIS — Z7984 Long term (current) use of oral hypoglycemic drugs: Secondary | ICD-10-CM | POA: Insufficient documentation

## 2022-09-19 DIAGNOSIS — I255 Ischemic cardiomyopathy: Secondary | ICD-10-CM | POA: Diagnosis not present

## 2022-09-19 DIAGNOSIS — N1832 Chronic kidney disease, stage 3b: Secondary | ICD-10-CM | POA: Diagnosis not present

## 2022-09-19 DIAGNOSIS — I2510 Atherosclerotic heart disease of native coronary artery without angina pectoris: Secondary | ICD-10-CM | POA: Insufficient documentation

## 2022-09-19 DIAGNOSIS — Z79899 Other long term (current) drug therapy: Secondary | ICD-10-CM | POA: Insufficient documentation

## 2022-09-19 DIAGNOSIS — I480 Paroxysmal atrial fibrillation: Secondary | ICD-10-CM | POA: Diagnosis not present

## 2022-09-19 DIAGNOSIS — I130 Hypertensive heart and chronic kidney disease with heart failure and stage 1 through stage 4 chronic kidney disease, or unspecified chronic kidney disease: Secondary | ICD-10-CM | POA: Insufficient documentation

## 2022-09-19 DIAGNOSIS — Z87891 Personal history of nicotine dependence: Secondary | ICD-10-CM | POA: Insufficient documentation

## 2022-09-19 DIAGNOSIS — Z955 Presence of coronary angioplasty implant and graft: Secondary | ICD-10-CM | POA: Insufficient documentation

## 2022-09-19 DIAGNOSIS — Z833 Family history of diabetes mellitus: Secondary | ICD-10-CM | POA: Insufficient documentation

## 2022-09-19 DIAGNOSIS — Z79890 Hormone replacement therapy (postmenopausal): Secondary | ICD-10-CM | POA: Diagnosis not present

## 2022-09-19 LAB — BASIC METABOLIC PANEL
Anion gap: 15 (ref 5–15)
BUN: 41 mg/dL — ABNORMAL HIGH (ref 8–23)
CO2: 23 mmol/L (ref 22–32)
Calcium: 9.3 mg/dL (ref 8.9–10.3)
Chloride: 98 mmol/L (ref 98–111)
Creatinine, Ser: 2.07 mg/dL — ABNORMAL HIGH (ref 0.44–1.00)
GFR, Estimated: 24 mL/min — ABNORMAL LOW (ref >60)
Glucose, Bld: 291 mg/dL — ABNORMAL HIGH (ref 70–99)
Potassium: 4.1 mmol/L (ref 3.5–5.1)
Sodium: 136 mmol/L (ref 135–145)

## 2022-09-19 NOTE — Patient Instructions (Signed)
Take Torsemide 60 mg (3 tabs) Daily Thursday and Friday, then back to 40 mg (2 tabs) Daily  Labs done today, your results will be available in MyChart, we will contact you for abnormal readings.  Your physician recommends that you schedule a follow-up appointment in: 2-3 months  If you have any questions or concerns before your next appointment please send [place] a message through [place] or call our office at [phone].    TO LEAVE A MESSAGE FOR THE NURSE SELECT OPTION 2, PLEASE LEAVE A MESSAGE INCLUDING: YOUR NAME DATE OF BIRTH CALL BACK NUMBER REASON FOR CALL**this is important as we prioritize the call backs  YOU WILL RECEIVE A CALL BACK THE SAME DAY AS LONG AS YOU CALL BEFORE 4:00 PM At the [facility], you and your health needs are our priority. As part of our continuing mission to provide you with exceptional heart care, we have created designated Provider Care Teams. These Care Teams include your primary Cardiologist (physician) and Advanced Practice Providers (APPs- Physician Assistants and Nurse Practitioners) who all work together to provide you with the care you need, when you need it.   You may see any of the following providers on your designated Care Team at your next follow up: Dr Arvilla Meres Dr Marca Ancona Dr. Marcos Eke, NP Robbie Lis, [place] [facility] [place], [place] Brynda Peon, NP Karle Plumber, PharmD   Please be sure to bring in all your medications bottles to every appointment.    Thank you for choosing [facility]

## 2022-09-19 NOTE — Progress Notes (Signed)
[facility]   PCP: Renford Dills, MD EP: Dr. Elberta Fortis Rheumatology: Dr. Lennette Bihari HF Cardiologist: Dr. Gala Romney  HPI: Danielle [name] is a 77 y.o.female with a past medical history of systolic HF (EF 45-50%), CAD s/p multiple stents in [place] (with h/o LAD stent thrombosis), ischemic cardiomyopathy (EF 10% at one point), symptomatic bradycardia s/p PPM, HTN, DM, hypothyroidism, and OSA.   Admitted 9/18 with SOB, acute on chronic systolic CHF. Diuresed with IV lasix (12 pounds), discharge weight was 215 pounds.    Had R TKR 12/19.    Admitted 11/22 with right knee septic arthritis. Arthrocentesis culture grew staph aureus.  Underwent I&D and polyexchange of infected right total knee arthroplasty.  Treated with 6 weeks IV antibiotics followed by suppressive abx. Aldactone held, losartan reduced to 25 mg daily and coreg reduced to 12.5 mg BID d/t soft BP. She was given 1 dose of furosemide on admission due to leg edema but this was held later due to AKI.  Torsemide was resumed at discharge.  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  Patient underwent explant right total knee arthroplasty with implantation of articulating antibiotic spacer by Dr. Blanchie Dessert on 07/12/2021. Placed on IV abx. Was discharged to SNF. During hospitalization was very anemic, but improving.   Echo 2/23 EF 40-45%   Follow up 4/23, unsure what meds she was taking. Re-enrolled in paramedicine.   Had knee re-replaced 12/20/21  Echo 05/08/22 showed EF 45-50%, mildly decreased LV with moderate LVH, grade II DD, RV ok, mild MR with severe MAC.  Today she returns for HF follow up with her daughter and HF Paramedic.  Over the last month torsemide was increased to 60 mg then back to 40 mg daily due to worsening renal function. Overall feeling fine. Frustrated about her transportation. Denies SOB/PND/Orthopnea. Legs started swelling over the last 5 days. Appetite ok.  Drinking lots of fluid. Eating sausage/bacon/pretzels at home.  No fever or chills. Weight at home 210-211 pounds. Taking all medications. Followed by HF Paramedicine.   [employer]: No A Fib Activity 1-2 hours daily.   Cardiac Studies: Echo (9/18): EF 45-50% Grade 2DD Left Atrium Severely dilated.  Echo (8/19): EF 45-50%, G2DD, LA mildly dilated, trileaflet moderately thick aortic valve Echo (12/21): EF 45-50% G2DD  Echo (2/23): EF 40-45%  Echo (12/23): EF 45-50%  ROS: All systems negative except as listed in HPI, PMH and Problem List.  SH:  Social History   Socioeconomic History   Marital status: Widowed    Spouse name: Not on file   Number of children: 3   Years of education: Not on file   Highest education level: Some college, no degree  Occupational History   Occupation: retired  Tobacco Use   Smoking status: Former    Packs/day: 1.00    Years: 22.00    Additional pack years: 0.00    Total pack years: 22.00    Types: Cigarettes    Quit date: 45    Years since quitting: 41.3   Smokeless tobacco: Never  Vaping Use   Vaping Use: Never used  Substance and Sexual Activity   Alcohol use: Yes    Comment: 05/22/2018 "glass of wine couple times/wk"   Drug use: Not Currently   Sexual activity: Not Currently    Birth control/protection: None, Post-menopausal  Other Topics Concern   Not on file  Social History Narrative   Not on file   Social Determinants of Health  Financial Resource Strain: Medium Risk (12/22/2021)   Overall Financial Resource Strain (CARDIA)    Difficulty of Paying Living Expenses: Somewhat hard  Food Insecurity: No Food Insecurity (05/08/2018)   Hunger Vital Sign    Worried About Running Out of Food in the Last Year: Never true    Ran Out of Food in the Last Year: Never true  Transportation Needs: Unmet Transportation Needs (05/08/2022)   PRAPARE - Administrator, Civil Service (Medical): Yes    Lack of Transportation (Non-Medical):  Yes  Physical Activity: Not on file  Stress: Not on file  Social Connections: Not on file  Intimate Partner Violence: Not on file    FH:  Family History  Problem Relation Age of Onset   Multiple myeloma Mother    Heart disease Father    Other Sister    Heart disease Brother    Diabetes Sister    Other Sister     Past Medical History:  Diagnosis Date   Acute combined systolic and diastolic congestive heart failure 02/05/2017   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 05/20/2018   Acute kidney failure 05/15/2018   Arthritis    "all over" (05/22/2018)   Blood dyscrasia    per pt-has small blood cells-appears as if anemic   Breast cancer, left breast 1985   CHF (congestive heart failure)    Complication of anesthesia    difficult to awaken from per pt   Coronary artery disease    Dyspnea    Essential hypertension    Heart disease    Heart murmur    History of gout    Hypothyroidism    Obesity (BMI 30-39.9) 11/11/2017   OSA (obstructive sleep apnea) 02/18/2017    severe obstructive sleep apnea with an AHI of 75.6/h and mild central sleep apnea with a CAI of 7.7/h.  Oxygen saturations dropped as low as 82%.   He is on CPAP at 9 cm H2O.   OSA on CPAP    Personal history of chemotherapy    Personal history of radiation therapy    Presence of permanent cardiac pacemaker    Primary localized osteoarthritis of right knee 05/13/2018   Refusal of blood transfusions as patient is Jehovah's Witness    Type II diabetes mellitus    Current Outpatient Medications  Medication Sig Dispense Refill   acetaminophen (TYLENOL) 500 MG tablet Take 500-1,000 mg by mouth every 6 (six) hours as needed (for pain or headaches).     allopurinol (ZYLOPRIM) 100 MG tablet Take 100 mg by mouth daily.     apixaban (ELIQUIS) 5 MG TABS tablet Take 1 tablet (5 mg total) by mouth 2 (two) times daily. 60 tablet 11   aspirin EC 81 MG tablet Take 1 tablet (81 mg total) by mouth daily. Swallow  whole. 360 tablet 0   atorvastatin (LIPITOR) 80 MG tablet Take 1 tablet (80 mg total) by mouth at bedtime. 90 tablet 3   Biotin 10 MG TABS Take 10 mg by mouth daily.     Blood Glucose Monitoring Suppl (ONETOUCH VERIO) w/Device KIT Use to check blood sugar 1 kit 0   carvedilol (COREG) 3.125 MG tablet Take 1 tablet (3.125 mg total) by mouth 2 (two) times daily with a meal. 60 tablet 0   celecoxib (CELEBREX) 100 MG capsule Take 100 mg by mouth 2 (two) times daily.     clotrimazole-betamethasone (LOTRISONE) lotion Apply 1 application topically 2 (two) times daily as  needed (itching).     Continuous Blood Gluc Sensor (DEXCOM G6 SENSOR) MISC 1 Device by Does not apply route as directed. 9 each 3   empagliflozin (JARDIANCE) 10 MG TABS tablet Take 1 tablet (10 mg total) by mouth daily. 30 tablet 11   Ferrous Sulfate (IRON) 325 (65 Fe) MG TABS Take 1 tablet (325 mg total) by mouth 2 (two) times daily before a meal. 180 tablet 3   folic acid (FOLVITE) 1 MG tablet Take 1 tablet (1 mg total) by mouth daily. 30 tablet 2   gabapentin (NEURONTIN) 300 MG capsule Take 300 mg by mouth 2 (two) times daily.     Insulin Glargine (BASAGLAR KWIKPEN) 100 UNIT/ML Inject 36 Units into the skin at bedtime. 45 mL 0   insulin lispro (HUMALOG) 100 UNIT/ML injection Inject 0.05 mLs (5 Units total) into the skin 3 (three) times daily before meals. 10 mL 0   Lancets (ONETOUCH DELICA PLUS LANCET33G) MISC USE   TO CHECK GLUCOSE 4 TIMES DAILY 100 each 3   latanoprost (XALATAN) 0.005 % ophthalmic solution Place 1 drop into both eyes at bedtime.     levothyroxine (SYNTHROID, LEVOTHROID) 75 MCG tablet Take 75 mcg by mouth daily before breakfast.     losartan (COZAAR) 25 MG tablet Take 1 tablet (25 mg total) by mouth daily. 30 tablet 6   LUMIGAN 0.01 % SOLN Place 1 drop into both eyes at bedtime.     Multiple Vitamins-Minerals (ALIVE ONCE DAILY WOMENS 50+ PO) Take 1 tablet by mouth daily.     spironolactone (ALDACTONE) 25 MG tablet  Take 0.5 tablets (12.5 mg total) by mouth daily. 45 tablet 3   torsemide (DEMADEX) 20 MG tablet Take 2 tablets (40 mg total) by mouth daily. 90 tablet 3   Travoprost, BAK Free, (TRAVATAN) 0.004 % SOLN ophthalmic solution Place 1 drop into both eyes at bedtime.     No current facility-administered medications for this encounter.   BP (!) 115/57   Pulse 73   Wt 99 kg (218 lb 4 oz)   SpO2 95%   BMI 37.46 kg/m   Wt Readings from Last 3 Encounters:  09/19/22 99 kg (218 lb 4 oz)  09/13/22 96.2 kg (212 lb)  09/06/22 98.4 kg (217 lb)   PHYSICAL EXAM: General:  Arrived in a wheel chair. No resp difficulty HEENT: normal Neck: supple. JVP 9-10  JVD. Carotids 2+ bilat; no bruits. No lymphadenopathy or thryomegaly appreciated. Cor: PMI nondisplaced. Regular rate & rhythm. No rubs, gallops or murmurs. Lungs: clear Abdomen: soft, nontender, nondistended. No hepatosplenomegaly. No bruits or masses. Good bowel sounds. Extremities: no cyanosis, clubbing, rash, R and LLE 1-2+ edema Neuro: alert & orientedx3, cranial nerves grossly intact. moves all 4 extremities w/o difficulty. Affect pleasant   ASSESSMENT & PLAN: 1. Chronic systolic CHF: ICM - Echo (12/21): EF 45-50% G2DD  - Echo (2/23): EF 40-45%  - Echo today 05/08/22 with EF 45-50%, moderate LVH grade II DD, RV normal, severe MAC. We discussed results today. - NYHA II. Volume status elevated. Suspect due to fluid and sodium intake.  - Increase torsemide 60 mg x 2 days then back torsemide 40 mg daily.  - Continue spiro 12.5 mg daily. - Continue losartan 25 mg daily.  - Continue carvedilol 3.125 mg bid. - Continue Jardiance 10 mg daily. - Check BMET  - Discussed low salt food choices and limiting fluid intake to < 2 liters daily   2. CAD:  - Stenting in 2010,  2012 and 2016.  -No chest pain. - Continue statin.  - No ASA with Eliquis.   3. Paroxysmal Afib - No A fib on device interrogation  - Continue Eliquis 5 mg bid.  -  (She is  Jehovah's Witness).   4. HTN - BP stable. - Continue current medications.    5. DM - Per Endocrine.  - On insulin. - Continue Jardiance. Could increase jardiance if Endocrinology  recommends.  -No GU symptoms.   6. H/o sinus pauses s/p PPM - Dr. Elberta Fortis now following.  - No change.    7. OSA - Continue CPAP nightly.  - Follows with Dr. Mayford Knife.   8. CKD 3b - Baseline SCr ~ 1.4. Last creatinine 2.2. Check BMET  - Continue Jardiance.   9. 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 Fatigue  Discussed low salt food choices and limiting fluid intake. Asked HF Paramedicine to take a look at her seasoning.  Check BMET .   Follow up in 2 months with Dr Gala Romney   Tonye Becket, NP  12:43 PM

## 2022-09-19 NOTE — Progress Notes (Signed)
Paramedicine Encounter  Patient ID: Danielle [name], female, DOB: 11/12/1945, 77 y.o.,  MRN: [id number]  Met patient in clinic today with provider.  Weight @ clinic-218lbs  B/P-115/57 P-73 SP02-95% REDS CLIP-N/A   Med changes: Torsemide  X2 days, back to  after same.   Arrived in clinic with [facility] today where she was seen by Tonye Becket, Arelyn reports feeling fine, she denied shortness of breath, she has present lower leg edema. Her weight is up today. We discussed her need to lower sodium intake and fluid intake. I reviewed meds and confirmed same- I filled pill box for one week. We reviewed importance of med compliance, diet compliance. She agreed and understood to limit added salts and fluid. Med changes noted and pill box reflects same. Visit complete. I will see Alayla in one week.    [name], EMT-Paramedic [phone] 09/19/2022

## 2022-09-20 ENCOUNTER — Telehealth (HOSPITAL_COMMUNITY): Payer: Self-pay

## 2022-09-20 ENCOUNTER — Telehealth (HOSPITAL_COMMUNITY): Payer: Self-pay | Admitting: *Deleted

## 2022-09-20 DIAGNOSIS — I5022 Chronic systolic (congestive) heart failure: Secondary | ICD-10-CM

## 2022-09-20 DIAGNOSIS — N1832 Chronic kidney disease, stage 3b: Secondary | ICD-10-CM

## 2022-09-20 NOTE — Telephone Encounter (Signed)
Labs done 4/24 with elevated Cr: per Tonye Becket, NP pt needs to stop Losartan, decrease celebrex use, and repeat labs in 1 wk.  Javius Sylla, community paramedic is aware, she will adjust pill box accordingly, bmet sch for 4/30

## 2022-09-20 NOTE — Telephone Encounter (Signed)
Spoke to [employer], RN at HF clinic regarding her lab results and med changes per Tonye Becket, NP.   -Stop Losartan -Repeat labs in one week.  -Reduce use of Celebrex.   I contacted Danielle [name] and was able to talk her through via facetime to remove the losartan from her pill box and she agreed to remove celebrex and take it only as needed. Same was done correctly. I also was able to assist in scheduling her labs which are planned for Tuesday April 30th at 3:15. She plans to set up transportation on her own. Call complete. I will follow up in the home next week accordingly.    [name], EMT-Paramedic [phone] 09/20/2022

## 2022-09-25 ENCOUNTER — Ambulatory Visit (HOSPITAL_COMMUNITY)
Admission: RE | Admit: 2022-09-25 | Discharge: 2022-09-25 | Disposition: A | Discharge status: Home / Self Care | Payer: Medicare HMO | Source: Ambulatory Visit | Attending: Cardiology | Admitting: Cardiology

## 2022-09-25 DIAGNOSIS — N1832 Chronic kidney disease, stage 3b: Secondary | ICD-10-CM | POA: Diagnosis not present

## 2022-09-25 DIAGNOSIS — I5022 Chronic systolic (congestive) heart failure: Secondary | ICD-10-CM | POA: Diagnosis not present

## 2022-09-25 LAB — BASIC METABOLIC PANEL
Anion gap: 11 (ref 5–15)
BUN: 40 mg/dL — ABNORMAL HIGH (ref 8–23)
CO2: 29 mmol/L (ref 22–32)
Calcium: 10 mg/dL (ref 8.9–10.3)
Chloride: 96 mmol/L — ABNORMAL LOW (ref 98–111)
Creatinine, Ser: 1.87 mg/dL — ABNORMAL HIGH (ref 0.44–1.00)
GFR, Estimated: 28 mL/min — ABNORMAL LOW (ref >60)
Glucose, Bld: 220 mg/dL — ABNORMAL HIGH (ref 70–99)
Potassium: 4 mmol/L (ref 3.5–5.1)
Sodium: 136 mmol/L (ref 135–145)

## 2022-09-27 ENCOUNTER — Other Ambulatory Visit (HOSPITAL_COMMUNITY): Payer: Self-pay

## 2022-09-27 NOTE — Progress Notes (Signed)
Paramedicine Encounter    Patient ID: Danielle [name], female    DOB: [birth year], 77 y.o.   MRN: [id number]   Complaints- hand pain and swelling, left big toe pain and swelling (gout related)   Assessment- CAOX4, warm and dry seated at kitchen table, no shortness of breath, weight down 9lbs, hands swollen and painful on movement, left big toe swollen, lungs clear, vitals obtained as noted, CBG 443 with no insulin yet today- claims she took her nighttime Basaglar and ate toast and chicken and rice this morning.   Compliance with meds- one missed dose of morning meds over the last week, missed humalog doses.   Pill box filled- for one week.   Refills needed- Folic Acid, Eliquis, Allupurinol, Jardiance, Levothyroxine, Gabapentin   Meds changes since last visit- decrease torsemide to 40mg  daily, stop losartan, stop celebrex     Social changes- none    BP (!) 140/70   Pulse 75   Resp 16   Wt 209 lb (94.8 kg)   SpO2 95%   BMI 35.87 kg/m  Weight yesterday-- 210lbs  Last visit weight-- 218lbs   Arrived for home visit for Danielle [name] who repors to be feeling well with no complaints other than hand and big toe pain and swelling. She reports this feels like her gout flare ups. She is taking 100mg  of Allupurinol daily- I recommended her calling her PCP about same and reducing the amount of red meat she is eating. She agreed. Her sugar is also elevated at 443 today and she has not had any insulin. I assisted her in taking her Humalog. She took 12 units rather than 6 for now- I placed a new dexcom on her right arm. I advised her to monitor same and continue to watch her sugar and carb intake. She agreed. I reviewed meds and confirmed same. I filled pill box for one week. She will be going to [place] for one month on May 8th. I plan to come out on Monday to fill a months worth of medication to assist her. Refills as noted called into [facility]. I advised her to pick up same tomorrow. She agreed. We reviewed  DM and HF management she agreed with same. Home visit complete.   [name], EMT-Paramedic [phone]  ACTION: Home visit completed    Patient Care Team: Renford Dills, MD as PCP - General (Internal Medicine) Regan Lemming, MD as PCP - Electrophysiology (Cardiology) Quintella Reichert, MD as PCP - Sleep Medicine (Cardiology) Bensimhon, Bevelyn Buckles, MD as PCP - Cardiology (Cardiology) Burna Sis, LCSW as Social Worker (Licensed Clinical Social Worker) Shamleffer, Konrad Dolores, MD as Consulting Physician (Endocrinology)  Patient Active Problem List   Diagnosis Date Noted   Chronic systolic heart failure (HCC) 05/08/2022   Type 2 diabetes mellitus with stage 3a chronic kidney disease, with long-term current use of insulin (HCC) 02/16/2022   Prosthetic joint implant failure, initial encounter (HCC) 12/20/2021   Pseudophakia of both eyes 11/15/2021   DVT (deep venous thrombosis) (HCC) 07/31/2021   Acute blood loss anemia    Septic Arthritis, Infection of prosthetic right knee joint (HCC), MSSA infection 07/10/2021   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) 07/10/2021   CAD (coronary artery disease) 06/08/2021   Carotid artery disease (HCC) 06/08/2021   Symptomatic bradycardia 06/08/2021   Paroxysmal atrial fibrillation (HCC) 06/08/2021   CKD (chronic kidney disease), stage III (HCC) 06/08/2021   Polyneuropathy associated with underlying disease (HCC) 05/04/2020   Severe nonproliferative diabetic retinopathy  of left eye, with macular edema, associated with type 2 diabetes mellitus (HCC) 11/05/2019   Severe nonproliferative diabetic retinopathy of right eye, with macular edema, associated with type 2 diabetes mellitus (HCC) 11/05/2019   Retinal hemorrhage of right eye 11/05/2019   Retinal hemorrhage of left eye 11/05/2019   Diabetes mellitus (HCC) 05/14/2019   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, with long-term current use of insulin  (HCC) 05/14/2019   Type 2 diabetes mellitus with diabetic polyneuropathy, with long-term current use of insulin (HCC) 05/14/2019   History of gout 05/20/2018   Primary localized osteoarthritis of right knee 05/13/2018   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) 12/20/2017   Obesity (BMI 30-39.9) 11/11/2017   OSA (obstructive sleep apnea) 02/18/2017   Essential hypertension    L Breast Cancer    Hypothyroidism     Current Outpatient Medications:    acetaminophen (TYLENOL) 500 MG tablet, Take 500-1,000 mg by mouth every 6 (six) hours as needed (for pain or headaches)., Disp: , Rfl:    allopurinol (ZYLOPRIM) 100 MG tablet, Take 100 mg by mouth daily., Disp: , Rfl:    apixaban (ELIQUIS) 5 MG TABS tablet, Take 1 tablet (5 mg total) by mouth 2 (two) times daily., Disp: 60 tablet, Rfl: 11   aspirin EC 81 MG tablet, Take 1 tablet (81 mg total) by mouth daily. Swallow whole., Disp: 360 tablet, Rfl: 0   atorvastatin (LIPITOR) 80 MG tablet, Take 1 tablet (80 mg total) by mouth at bedtime., Disp: 90 tablet, Rfl: 3   Biotin 10 MG TABS, Take 10 mg by mouth daily., Disp: , Rfl:    Blood Glucose Monitoring Suppl (ONETOUCH VERIO) w/Device KIT, Use to check blood sugar, Disp: 1 kit, Rfl: 0   carvedilol (COREG) 3.125 MG tablet, Take 1 tablet (3.125 mg total) by mouth 2 (two) times daily with a meal., Disp: 60 tablet, Rfl: 0   clotrimazole-betamethasone (LOTRISONE) lotion, Apply 1 application topically 2 (two) times daily as needed (itching)., Disp: , Rfl:    Continuous Blood Gluc Sensor (DEXCOM G6 SENSOR) MISC, 1 Device by Does not apply route as directed., Disp: 9 each, Rfl: 3   empagliflozin (JARDIANCE) 10 MG TABS tablet, Take 1 tablet (10 mg total) by mouth daily., Disp: 30 tablet, Rfl: 11   Ferrous Sulfate (IRON) 325 (65 Fe) MG TABS, Take 1 tablet (325 mg total) by mouth 2 (two) times daily before a meal., Disp: 180 tablet, Rfl: 3   folic acid (FOLVITE) 1 MG tablet, Take 1 tablet (1 mg total) by mouth  daily., Disp: 30 tablet, Rfl: 2   gabapentin (NEURONTIN) 300 MG capsule, Take 300 mg by mouth 2 (two) times daily., Disp: , Rfl:    Insulin Glargine (BASAGLAR KWIKPEN) 100 UNIT/ML, Inject 36 Units into the skin at bedtime., Disp: 45 mL, Rfl: 0   insulin lispro (HUMALOG) 100 UNIT/ML injection, Inject 0.05 mLs (5 Units total) into the skin 3 (three) times daily before meals., Disp: 10 mL, Rfl: 0   Lancets (ONETOUCH DELICA PLUS LANCET33G) MISC, USE   TO CHECK GLUCOSE 4 TIMES DAILY, Disp: 100 each, Rfl: 3   latanoprost (XALATAN) 0.005 % ophthalmic solution, Place 1 drop into both eyes at bedtime., Disp: , Rfl:    levothyroxine (SYNTHROID, LEVOTHROID) 75 MCG tablet, Take 75 mcg by mouth daily before breakfast., Disp: , Rfl:    LUMIGAN 0.01 % SOLN, Place 1 drop into both eyes at bedtime., Disp: , Rfl:    Multiple Vitamins-Minerals (ALIVE  ONCE DAILY WOMENS 50+ PO), Take 1 tablet by mouth daily., Disp: , Rfl:    spironolactone (ALDACTONE) 25 MG tablet, Take 0.5 tablets (12.5 mg total) by mouth daily., Disp: 45 tablet, Rfl: 3   torsemide (DEMADEX) 20 MG tablet, Take 2 tablets (40 mg total) by mouth daily., Disp: 90 tablet, Rfl: 3   Travoprost, BAK Free, (TRAVATAN) 0.004 % SOLN ophthalmic solution, Place 1 drop into both eyes at bedtime., Disp: , Rfl:    celecoxib (CELEBREX) 100 MG capsule, Take 100 mg by mouth 2 (two) times daily. (Patient not taking: Reported on 09/27/2022), Disp: , Rfl:  Allergies  Allergen Reactions   Codeine Other (See Comments)   Other Other (See Comments)    Blood Product Refusal (Jehovah's witness)     Tape Other (See Comments)    Leaves blisters and marks on the skin   Sulfa Antibiotics Rash   Uloric [Febuxostat] Rash     Social History   Socioeconomic History   Marital status: Widowed    Spouse name: Not on file   Number of children: 3   Years of education: Not on file   Highest education level: Some college, no degree  Occupational History   Occupation: retired   Tobacco Use   Smoking status: Former    Packs/day: 1.00    Years: 22.00    Additional pack years: 0.00    Total pack years: 22.00    Types: Cigarettes    Quit date: 24    Years since quitting: 41.3   Smokeless tobacco: Never  Vaping Use   Vaping Use: Never used  Substance and Sexual Activity   Alcohol use: Yes    Comment: 05/22/2018 "glass of wine couple times/wk"   Drug use: Not Currently   Sexual activity: Not Currently    Birth control/protection: None, Post-menopausal  Other Topics Concern   Not on file  Social History Narrative   Not on file   Social Determinants of Health   Financial Resource Strain: Medium Risk (12/22/2021)   Overall Financial Resource Strain (CARDIA)    Difficulty of Paying Living Expenses: Somewhat hard  Food Insecurity: No Food Insecurity (05/08/2018)   Hunger Vital Sign    Worried About Running Out of Food in the Last Year: Never true    Ran Out of Food in the Last Year: Never true  Transportation Needs: Unmet Transportation Needs (05/08/2022)   PRAPARE - Administrator, Civil Service (Medical): Yes    Lack of Transportation (Non-Medical): Yes  Physical Activity: Not on file  Stress: Not on file  Social Connections: Not on file  Intimate Partner Violence: Not on file    Physical Exam      Future Appointments  Date Time Provider Department Center  11/05/2022 11:15 AM Louann Sjogren, DPM [facility]  11/12/2022  2:20 PM Bensimhon, Bevelyn Buckles, MD [facility] None  11/28/2022  2:00 PM [facility] DEVICE REMOTES [facility]  01/16/2023 10:00 AM [facility] - [facility]  01/16/2023 10:30 AM [facility]ASC [facility] - [facility]  01/16/2023 11:20 AM Maeola Harman, MD [facility]  02/22/2023 11:30 AM Shamleffer, Konrad Dolores, MD [facility] None  02/27/2023  2:00 PM [facility] DEVICE REMOTES [facility]  05/30/2023  7:00 AM [facility] DEVICE REMOTES [facility]  08/28/2023   2:00 PM [facility] DEVICE REMOTES [facility]  11/27/2023  2:00 PM [facility] DEVICE REMOTES [facility]  02/26/2024  2:00 PM [facility] DEVICE REMOTES [facility]  05/27/2024  2:00 PM [facility] DEVICE REMOTES [facility]

## 2022-09-28 NOTE — Telephone Encounter (Signed)
ERROR

## 2022-10-01 ENCOUNTER — Other Ambulatory Visit (HOSPITAL_COMMUNITY): Payer: Self-pay

## 2022-10-01 NOTE — Progress Notes (Signed)
Paramedicine Encounter    Patient ID: Danielle [name], female    DOB: Jun 22, 1945, 77 y.o.   MRN: [id number]  Met with Danielle [name] in the home today for a med rec. She is leaving to go out of town for a month to her hometown in [place] with family. I reviewed meds and filled pill boxes for one month. She will needs Carvedilol and Torsemide refills called into [place] [facility]. I will request same closer to time. She needs new RX for pen needles and test strips I will reach out to endocrinology for same. She had no complaints today and reports feeling well. I replaced dexcom for her and pill boxes were explained. She agreed with plans. Home visit complete.   [name], EMT-Paramedic [phone] 10/01/2022   Patient Care Team: Renford Dills, MD as PCP - General (Internal Medicine) Regan Lemming, MD as PCP - Electrophysiology (Cardiology) Quintella Reichert, MD as PCP - Sleep Medicine (Cardiology) Bensimhon, Bevelyn Buckles, MD as PCP - Cardiology (Cardiology) Burna Sis, LCSW as Social Worker (Licensed Clinical Social Worker) Shamleffer, Konrad Dolores, MD as Consulting Physician (Endocrinology)  Patient Active Problem List   Diagnosis Date Noted   Chronic systolic heart failure (HCC) 05/08/2022   Type 2 diabetes mellitus with stage 3a chronic kidney disease, with long-term current use of insulin (HCC) 02/16/2022   Prosthetic joint implant failure, initial encounter (HCC) 12/20/2021   Pseudophakia of both eyes 11/15/2021   DVT (deep venous thrombosis) (HCC) 07/31/2021   Acute blood loss anemia    Septic Arthritis, Infection of prosthetic right knee joint (HCC), MSSA infection 07/10/2021   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) 07/10/2021   CAD (coronary artery disease) 06/08/2021   Carotid artery disease (HCC) 06/08/2021   Symptomatic bradycardia 06/08/2021   Paroxysmal atrial fibrillation (HCC) 06/08/2021   CKD (chronic kidney disease), stage III (HCC) 06/08/2021   Polyneuropathy  associated with underlying disease (HCC) 05/04/2020   Severe nonproliferative diabetic retinopathy of left eye, with macular edema, associated with type 2 diabetes mellitus (HCC) 11/05/2019   Severe nonproliferative diabetic retinopathy of right eye, with macular edema, associated with type 2 diabetes mellitus (HCC) 11/05/2019   Retinal hemorrhage of right eye 11/05/2019   Retinal hemorrhage of left eye 11/05/2019   Diabetes mellitus (HCC) 05/14/2019   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, with long-term current use of insulin (HCC) 05/14/2019   Type 2 diabetes mellitus with diabetic polyneuropathy, with long-term current use of insulin (HCC) 05/14/2019   History of gout 05/20/2018   Primary localized osteoarthritis of right knee 05/13/2018   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) 12/20/2017   Obesity (BMI 30-39.9) 11/11/2017   OSA (obstructive sleep apnea) 02/18/2017   Essential hypertension    L Breast Cancer    Hypothyroidism     Current Outpatient Medications:    acetaminophen (TYLENOL) 500 MG tablet, Take 500-1,000 mg by mouth every 6 (six) hours as needed (for pain or headaches)., Disp: , Rfl:    allopurinol (ZYLOPRIM) 100 MG tablet, Take 100 mg by mouth daily., Disp: , Rfl:    apixaban (ELIQUIS) 5 MG TABS tablet, Take 1 tablet (5 mg total) by mouth 2 (two) times daily., Disp: 60 tablet, Rfl: 11   aspirin EC 81 MG tablet, Take 1 tablet (81 mg total) by mouth daily. Swallow whole., Disp: 360 tablet, Rfl: 0   atorvastatin (LIPITOR) 80 MG tablet, Take 1 tablet (80 mg total) by mouth at bedtime., Disp: 90 tablet, Rfl: 3  Biotin 10 MG TABS, Take 10 mg by mouth daily., Disp: , Rfl:    Blood Glucose Monitoring Suppl (ONETOUCH VERIO) w/Device KIT, Use to check blood sugar, Disp: 1 kit, Rfl: 0   carvedilol (COREG) 3.125 MG tablet, Take 1 tablet (3.125 mg total) by mouth 2 (two) times daily with a meal., Disp: 60 tablet, Rfl: 0   celecoxib (CELEBREX) 100 MG capsule,  Take 100 mg by mouth 2 (two) times daily. (Patient not taking: Reported on 09/27/2022), Disp: , Rfl:    clotrimazole-betamethasone (LOTRISONE) lotion, Apply 1 application topically 2 (two) times daily as needed (itching)., Disp: , Rfl:    Continuous Blood Gluc Sensor (DEXCOM G6 SENSOR) MISC, 1 Device by Does not apply route as directed., Disp: 9 each, Rfl: 3   empagliflozin (JARDIANCE) 10 MG TABS tablet, Take 1 tablet (10 mg total) by mouth daily., Disp: 30 tablet, Rfl: 11   Ferrous Sulfate (IRON) 325 (65 Fe) MG TABS, Take 1 tablet (325 mg total) by mouth 2 (two) times daily before a meal., Disp: 180 tablet, Rfl: 3   folic acid (FOLVITE) 1 MG tablet, Take 1 tablet (1 mg total) by mouth daily., Disp: 30 tablet, Rfl: 2   gabapentin (NEURONTIN) 300 MG capsule, Take 300 mg by mouth 2 (two) times daily., Disp: , Rfl:    Insulin Glargine (BASAGLAR KWIKPEN) 100 UNIT/ML, Inject 36 Units into the skin at bedtime., Disp: 45 mL, Rfl: 0   insulin lispro (HUMALOG) 100 UNIT/ML injection, Inject 0.05 mLs (5 Units total) into the skin 3 (three) times daily before meals., Disp: 10 mL, Rfl: 0   Lancets (ONETOUCH DELICA PLUS LANCET33G) MISC, USE   TO CHECK GLUCOSE 4 TIMES DAILY, Disp: 100 each, Rfl: 3   latanoprost (XALATAN) 0.005 % ophthalmic solution, Place 1 drop into both eyes at bedtime., Disp: , Rfl:    levothyroxine (SYNTHROID, LEVOTHROID) 75 MCG tablet, Take 75 mcg by mouth daily before breakfast., Disp: , Rfl:    LUMIGAN 0.01 % SOLN, Place 1 drop into both eyes at bedtime., Disp: , Rfl:    Multiple Vitamins-Minerals (ALIVE ONCE DAILY WOMENS 50+ PO), Take 1 tablet by mouth daily., Disp: , Rfl:    spironolactone (ALDACTONE) 25 MG tablet, Take 0.5 tablets (12.5 mg total) by mouth daily., Disp: 45 tablet, Rfl: 3   torsemide (DEMADEX) 20 MG tablet, Take 2 tablets (40 mg total) by mouth daily., Disp: 90 tablet, Rfl: 3   Travoprost, BAK Free, (TRAVATAN) 0.004 % SOLN ophthalmic solution, Place 1 drop into both eyes at  bedtime., Disp: , Rfl:  Allergies  Allergen Reactions   Codeine Other (See Comments)   Other Other (See Comments)    Blood Product Refusal (Jehovah's witness)     Tape Other (See Comments)    Leaves blisters and marks on the skin   Sulfa Antibiotics Rash   Uloric [Febuxostat] Rash     Social History   Socioeconomic History   Marital status: Widowed    Spouse name: Not on file   Number of children: 3   Years of education: Not on file   Highest education level: Some college, no degree  Occupational History   Occupation: retired  Tobacco Use   Smoking status: Former    Packs/day: 1.00    Years: 22.00    Additional pack years: 0.00    Total pack years: 22.00    Types: Cigarettes    Quit date: 1983    Years since quitting: 41.3   Smokeless tobacco:  Never  Vaping Use   Vaping Use: Never used  Substance and Sexual Activity   Alcohol use: Yes    Comment: 05/22/2018 "glass of wine couple times/wk"   Drug use: Not Currently   Sexual activity: Not Currently    Birth control/protection: None, Post-menopausal  Other Topics Concern   Not on file  Social History Narrative   Not on file   Social Determinants of Health   Financial Resource Strain: Medium Risk (12/22/2021)   Overall Financial Resource Strain (CARDIA)    Difficulty of Paying Living Expenses: Somewhat hard  Food Insecurity: No Food Insecurity (05/08/2018)   Hunger Vital Sign    Worried About Running Out of Food in the Last Year: Never true    Ran Out of Food in the Last Year: Never true  Transportation Needs: Unmet Transportation Needs (05/08/2022)   PRAPARE - Administrator, Civil Service (Medical): Yes    Lack of Transportation (Non-Medical): Yes  Physical Activity: Not on file  Stress: Not on file  Social Connections: Not on file  Intimate Partner Violence: Not on file    Physical Exam      Future Appointments  Date Time Provider Department Center  11/05/2022 11:15 AM Louann Sjogren, DPM [facility]  11/12/2022  2:20 PM Bensimhon, Bevelyn Buckles, MD [facility] None  11/28/2022  2:00 PM [facility] DEVICE REMOTES [facility]  01/16/2023 10:00 AM [facility] - [facility]  01/16/2023 10:30 AM [facility] - [facility]  01/16/2023 11:20 AM Maeola Harman, MD [facility]  02/22/2023 11:30 AM Shamleffer, Konrad Dolores, MD [facility] None  02/27/2023  2:00 PM [facility] DEVICE REMOTES [facility]  05/30/2023  7:00 AM [facility] DEVICE REMOTES [facility]  08/28/2023  2:00 PM [facility] DEVICE REMOTES [facility]  11/27/2023  2:00 PM [facility] DEVICE REMOTES [facility]  02/26/2024  2:00 PM [facility] DEVICE REMOTES [facility]  05/27/2024  2:00 PM [facility] DEVICE REMOTES [facility]     ACTION: Home visit completed

## 2022-10-04 NOTE — Progress Notes (Signed)
Remote pacemaker transmission.   

## 2022-10-16 ENCOUNTER — Telehealth (HOSPITAL_COMMUNITY): Payer: Self-pay

## 2022-10-16 NOTE — Telephone Encounter (Signed)
Danielle [name] is staying in [place] with family until middle of June and is going to run out of Carvedilol and Torsemide. Can we please send these two to the following pharmacy for a one time fill so she is not without meds. Thanks!   [facility]  [address] [place] [address] [phone]    [name], EMT-Paramedic [phone] 10/16/2022

## 2022-10-17 ENCOUNTER — Other Ambulatory Visit (HOSPITAL_COMMUNITY): Payer: Self-pay

## 2022-10-17 MED ORDER — TORSEMIDE 20 MG PO TABS: 40.0000 mg | ORAL_TABLET | Freq: Every day | ORAL | 3 refills | Status: DC

## 2022-10-17 MED ORDER — CARVEDILOL 3.125 MG PO TABS: 3.1250 mg | ORAL_TABLET | Freq: Two times a day (BID) | ORAL | 0 refills | Status: DC

## 2022-11-01 ENCOUNTER — Telehealth (HOSPITAL_COMMUNITY): Payer: Self-pay

## 2022-11-01 ENCOUNTER — Other Ambulatory Visit (HOSPITAL_COMMUNITY): Payer: Self-pay

## 2022-11-01 DIAGNOSIS — E1165 Type 2 diabetes mellitus with hyperglycemia: Secondary | ICD-10-CM | POA: Diagnosis not present

## 2022-11-01 NOTE — Telephone Encounter (Signed)
Please send one touch verio test strips to [facility] on [address].  They do not have same on file and [facility] only has 4 test strips.   Thanks so much!    [name], EMT-Paramedic [phone] 11/01/2022

## 2022-11-01 NOTE — Progress Notes (Signed)
Paramedicine Encounter    Patient ID: Danielle [name], female    DOB: 11-11-1945, 77 y.o.   MRN: [id number]   Complaints- fatigued after traveling   Assessment- CAOX4, warm and dry seated in her kitchen with no complaints aside from feeling tired after traveling home from [place] last night. She denied any recent episodes of chest pain, shortness of breath, dizziness, weight gain or swelling. She does report having some high blood sugars up to the 300's. Her dexcom fell off while in [place] and she had misplaced her glucometer nearing the end of her trip as well as running out of her nighttime insulin- Basaglar. She reports she has been using her Humalog and taking her daily medications.   Compliance with meds- reports no missed doses of medications over the last month.   Pill box filled- for one week minus Torsemide missing on Sunday, Monday, Tuesday, Weds, Thursday mornings. She plans to pick up refills tomorrow.   Refills needed- Torsemide Eliquis Jardiance   Meds changes since last visit- none     Social changes- none   Arrived for home visit for Danielle [name] who reports she came back in town last night from [place] where she has been for the last month. She reports she had a great trip and had no episodes of symptoms while there. She reports her family there encouraged her to eat heart healthy, low sodium and low sugar diet while there. She has lost 3 lbs since last visit. Some mild swelling in left lower leg today. I obtained vitals and assessment. Lungs clear. I obtained blood sugar it was 277- she has been without her nighttime insulin Basaglar for over a week. Her dexcom came off a few days ago and she has been checking her sugar manually since. She reports having some high readings since being without her Basaglar- I reordered same today and it will be ready tomorrow. I also assisted Danielle [name] in re-ordering her dexcom today and she paid a balance of $88 for re-order. I reviewed meds and filled pill  box for one week. Note left of where to place torsemide once picked up.  We reviewed upcoming appointments and I wrote down same. She reports using her CPAP while in [place] also. Home visit complete I will see her in one week. Home visit complete.    BP (!) 130/92   Pulse 85   Resp 16   SpO2 97%  Weight yesterday--didn't weigh  Last visit weight-- 209lbs    [name], EMT-Paramedic [phone]  ACTION: Home visit completed    Patient Care Team: Renford Dills, MD as PCP - General (Internal Medicine) Regan Lemming, MD as PCP - Electrophysiology (Cardiology) Quintella Reichert, MD as PCP - Sleep Medicine (Cardiology) Bensimhon, Bevelyn Buckles, MD as PCP - Cardiology (Cardiology) Burna Sis, LCSW as Social Worker (Licensed Clinical Social Worker) Shamleffer, Konrad Dolores, MD as Consulting Physician (Endocrinology)  Patient Active Problem List   Diagnosis Date Noted   Chronic systolic heart failure (HCC) 05/08/2022   Type 2 diabetes mellitus with stage 3a chronic kidney disease, with long-term current use of insulin (HCC) 02/16/2022   Prosthetic joint implant failure, initial encounter (HCC) 12/20/2021   Pseudophakia of both eyes 11/15/2021   DVT (deep venous thrombosis) (HCC) 07/31/2021   Acute blood loss anemia    Septic Arthritis, Infection of prosthetic right knee joint (HCC), MSSA infection 07/10/2021   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) 07/10/2021   CAD (coronary artery disease) 06/08/2021  Carotid artery disease (HCC) 06/08/2021   Symptomatic bradycardia 06/08/2021   Paroxysmal atrial fibrillation (HCC) 06/08/2021   CKD (chronic kidney disease), stage III (HCC) 06/08/2021   Polyneuropathy associated with underlying disease (HCC) 05/04/2020   Severe nonproliferative diabetic retinopathy of left eye, with macular edema, associated with type 2 diabetes mellitus (HCC) 11/05/2019   Severe nonproliferative diabetic retinopathy of right eye, with  macular edema, associated with type 2 diabetes mellitus (HCC) 11/05/2019   Retinal hemorrhage of right eye 11/05/2019   Retinal hemorrhage of left eye 11/05/2019   Diabetes mellitus (HCC) 05/14/2019   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, with long-term current use of insulin (HCC) 05/14/2019   Type 2 diabetes mellitus with diabetic polyneuropathy, with long-term current use of insulin (HCC) 05/14/2019   History of gout 05/20/2018   Primary localized osteoarthritis of right knee 05/13/2018   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) 12/20/2017   Obesity (BMI 30-39.9) 11/11/2017   OSA (obstructive sleep apnea) 02/18/2017   Essential hypertension    L Breast Cancer    Hypothyroidism     Current Outpatient Medications:    acetaminophen (TYLENOL) 500 MG tablet, Take 500-1,000 mg by mouth every 6 (six) hours as needed (for pain or headaches)., Disp: , Rfl:    allopurinol (ZYLOPRIM) 100 MG tablet, Take 100 mg by mouth daily., Disp: , Rfl:    apixaban (ELIQUIS) 5 MG TABS tablet, Take 1 tablet (5 mg total) by mouth 2 (two) times daily., Disp: 60 tablet, Rfl: 11   aspirin EC 81 MG tablet, Take 1 tablet (81 mg total) by mouth daily. Swallow whole., Disp: 360 tablet, Rfl: 0   atorvastatin (LIPITOR) 80 MG tablet, Take 1 tablet (80 mg total) by mouth at bedtime., Disp: 90 tablet, Rfl: 3   Biotin 10 MG TABS, Take 10 mg by mouth daily., Disp: , Rfl:    Blood Glucose Monitoring Suppl (ONETOUCH VERIO) w/Device KIT, Use to check blood sugar, Disp: 1 kit, Rfl: 0   carvedilol (COREG) 3.125 MG tablet, Take 1 tablet (3.125 mg total) by mouth 2 (two) times daily with a meal., Disp: 60 tablet, Rfl: 0   clotrimazole-betamethasone (LOTRISONE) lotion, Apply 1 application topically 2 (two) times daily as needed (itching)., Disp: , Rfl:    Continuous Blood Gluc Sensor (DEXCOM G6 SENSOR) MISC, 1 Device by Does not apply route as directed., Disp: 9 each, Rfl: 3   empagliflozin (JARDIANCE) 10 MG  TABS tablet, Take 1 tablet (10 mg total) by mouth daily., Disp: 30 tablet, Rfl: 11   Ferrous Sulfate (IRON) 325 (65 Fe) MG TABS, Take 1 tablet (325 mg total) by mouth 2 (two) times daily before a meal., Disp: 180 tablet, Rfl: 3   folic acid (FOLVITE) 1 MG tablet, Take 1 tablet (1 mg total) by mouth daily., Disp: 30 tablet, Rfl: 2   gabapentin (NEURONTIN) 300 MG capsule, Take 300 mg by mouth 2 (two) times daily., Disp: , Rfl:    Insulin Glargine (BASAGLAR KWIKPEN) 100 UNIT/ML, Inject 36 Units into the skin at bedtime., Disp: 45 mL, Rfl: 0   insulin lispro (HUMALOG) 100 UNIT/ML injection, Inject 0.05 mLs (5 Units total) into the skin 3 (three) times daily before meals., Disp: 10 mL, Rfl: 0   Lancets (ONETOUCH DELICA PLUS LANCET33G) MISC, USE   TO CHECK GLUCOSE 4 TIMES DAILY, Disp: 100 each, Rfl: 3   latanoprost (XALATAN) 0.005 % ophthalmic solution, Place 1 drop into both eyes at bedtime., Disp: , Rfl:  levothyroxine (SYNTHROID, LEVOTHROID) 75 MCG tablet, Take 75 mcg by mouth daily before breakfast., Disp: , Rfl:    LUMIGAN 0.01 % SOLN, Place 1 drop into both eyes at bedtime., Disp: , Rfl:    Multiple Vitamins-Minerals (ALIVE ONCE DAILY WOMENS 50+ PO), Take 1 tablet by mouth daily., Disp: , Rfl:    spironolactone (ALDACTONE) 25 MG tablet, Take 0.5 tablets (12.5 mg total) by mouth daily., Disp: 45 tablet, Rfl: 3   torsemide (DEMADEX) 20 MG tablet, Take 2 tablets (40 mg total) by mouth daily., Disp: 90 tablet, Rfl: 3   Travoprost, BAK Free, (TRAVATAN) 0.004 % SOLN ophthalmic solution, Place 1 drop into both eyes at bedtime., Disp: , Rfl:    celecoxib (CELEBREX) 100 MG capsule, Take 100 mg by mouth 2 (two) times daily. (Patient not taking: Reported on 10/01/2022), Disp: , Rfl:  Allergies  Allergen Reactions   Codeine Other (See Comments)   Other Other (See Comments)    Blood Product Refusal (Jehovah's witness)     Tape Other (See Comments)    Leaves blisters and marks on the skin   Sulfa  Antibiotics Rash   Uloric [Febuxostat] Rash     Social History   Socioeconomic History   Marital status: Widowed    Spouse name: Not on file   Number of children: 3   Years of education: Not on file   Highest education level: Some college, no degree  Occupational History   Occupation: retired  Tobacco Use   Smoking status: Former    Packs/day: 1.00    Years: 22.00    Additional pack years: 0.00    Total pack years: 22.00    Types: Cigarettes    Quit date: 48    Years since quitting: 41.4   Smokeless tobacco: Never  Vaping Use   Vaping Use: Never used  Substance and Sexual Activity   Alcohol use: Yes    Comment: 05/22/2018 "glass of wine couple times/wk"   Drug use: Not Currently   Sexual activity: Not Currently    Birth control/protection: None, Post-menopausal  Other Topics Concern   Not on file  Social History Narrative   Not on file   Social Determinants of Health   Financial Resource Strain: Medium Risk (12/22/2021)   Overall Financial Resource Strain (CARDIA)    Difficulty of Paying Living Expenses: Somewhat hard  Food Insecurity: No Food Insecurity (05/08/2018)   Hunger Vital Sign    Worried About Running Out of Food in the Last Year: Never true    Ran Out of Food in the Last Year: Never true  Transportation Needs: Unmet Transportation Needs (05/08/2022)   PRAPARE - Administrator, Civil Service (Medical): Yes    Lack of Transportation (Non-Medical): Yes  Physical Activity: Not on file  Stress: Not on file  Social Connections: Not on file  Intimate Partner Violence: Not on file    Physical Exam      Future Appointments  Date Time Provider Department Center  11/05/2022 11:15 AM Louann Sjogren, DPM [facility]  11/09/2022 10:10 AM Shamleffer, Konrad Dolores, MD [facility] None  11/12/2022  2:20 PM Bensimhon, Bevelyn Buckles, MD [facility] None  11/28/2022  2:00 PM [facility] DEVICE REMOTES [facility]  01/16/2023 10:00 AM  [facility] VASC 6 - [facility]  01/16/2023 10:30 AM [facility] - [facility]  01/16/2023 11:20 AM Maeola Harman, MD [facility]  02/22/2023 11:30 AM Shamleffer, Konrad Dolores, MD [facility] None  02/27/2023  2:00 PM [facility] DEVICE REMOTES [facility]  05/30/2023  7:00 AM [facility] DEVICE REMOTES [facility]  08/28/2023  2:00 PM [facility] DEVICE REMOTES [facility]  11/27/2023  2:00 PM [facility] DEVICE REMOTES [facility]  02/26/2024  2:00 PM [facility] DEVICE REMOTES [facility]  05/27/2024  2:00 PM [facility] DEVICE REMOTES [facility]

## 2022-11-05 ENCOUNTER — Encounter: Payer: Self-pay | Admitting: Podiatry

## 2022-11-05 ENCOUNTER — Ambulatory Visit: Payer: Medicare HMO | Admitting: Podiatry

## 2022-11-05 DIAGNOSIS — M86672 Other chronic osteomyelitis, left ankle and foot: Secondary | ICD-10-CM | POA: Diagnosis not present

## 2022-11-05 DIAGNOSIS — Z87898 Personal history of other specified conditions: Secondary | ICD-10-CM | POA: Diagnosis not present

## 2022-11-05 DIAGNOSIS — E1151 Type 2 diabetes mellitus with diabetic peripheral angiopathy without gangrene: Secondary | ICD-10-CM | POA: Diagnosis not present

## 2022-11-05 DIAGNOSIS — L84 Corns and callosities: Secondary | ICD-10-CM

## 2022-11-05 MED ORDER — ONETOUCH VERIO VI STRP: ORAL_STRIP | 12 refills | Status: AC

## 2022-11-05 NOTE — Addendum Note (Signed)
Addended by: Lisabeth Pick on: 11/05/2022 09:14 AM   Modules accepted: Orders

## 2022-11-05 NOTE — Progress Notes (Signed)
Subjective:  Patient ID: Danielle [name], female    DOB: September 02, 1945,   MRN: [id number]  Chief Complaint  Patient presents with   Wound Check    Wound check    77 y.o. female presents for follow-up of left second toe wound. . Returned from her trip to [place].   Relates toe doing well. No pain but still discolored.  Underwent vascular procedure 3/4.   Denies any other pedal complaints. Denies n/v/f/c.   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HCC) 02/05/2017   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 05/20/2018   Acute kidney failure (HCC) 05/15/2018   Arthritis    "all over" (05/22/2018)   Blood dyscrasia    per pt-has small blood cells-appears as if anemic   Breast cancer, left breast (HCC) 1985   CHF (congestive heart failure) (HCC)    Complication of anesthesia    difficult to awaken from per pt   Coronary artery disease    Dyspnea    Essential hypertension    Heart disease    Heart murmur    History of gout    Hypothyroidism    Obesity (BMI 30-39.9) 11/11/2017   OSA (obstructive sleep apnea) 02/18/2017    severe obstructive sleep apnea with an AHI of 75.6/h and mild central sleep apnea with a CAI of 7.7/h.  Oxygen saturations dropped as low as 82%.   He is on CPAP at 9 cm H2O.   OSA on CPAP    Personal history of chemotherapy    Personal history of radiation therapy    Presence of permanent cardiac pacemaker    Primary localized osteoarthritis of right knee 05/13/2018   Refusal of blood transfusions as patient is Jehovah's Witness    Type II diabetes mellitus (HCC)     Objective:  Physical Exam: Vascular: DP/PT pulses 2/4 bilateral. CFT <3 seconds. Absent hair growth on digits. Edema noted to bilateral lower extremities. Xerosis noted bilaterally.  Skin. No lacerations or abrasions bilateral feet. Nails 1-5 bilateral  are thickened discolored and  with subungual debris. Left second digit  ulceration Healed. Mild discoloration noted to the second digit. No purulence. Does not  probe to bone today.  Musculoskeletal: MMT 5/5 bilateral lower extremities in DF, PF, Inversion and Eversion. Deceased ROM in DF of ankle joint.  Neurological: Sensation intact to light touch. Protective sensation diminished bilateral.   MRI left foot:  IMPRESSION: 1. Ulceration at the tip of the second toe with underlying osteomyelitis of the second distal phalanx.  Assessment:   1. Pre-ulcerative calluses   2. History of ulceration   3. Type II diabetes mellitus with peripheral circulatory disorder (HCC)   4. Chronic osteomyelitis involving left ankle and foot (HCC)            Plan:  Patient was evaluated and treated and all questions answered. Ulcer left second digit - remains healed, chronic necrosis of bone.  -Debridement of hyperkeratatotic tissue with underlying ulcer healed.  -Dressed with bandaid for protection Vascular procedure preformed on 3/4  MRI showing osteomyelitis of the distal tip of the phalanx of the second digit. Discussed these results with patient. Likely feel this is more a chronic osteomyelitis as she has improved. Will continue to monitor and discussed need for possible amputation in the future.   -Discussed glucose control and proper protein-rich diet.  -Discussed if any worsening redness, pain, fever or chills to call or  may need to report to the emergency room. Patient expressed understanding.     Return in 3 months for rfc.   Return in about 3 months (around 02/05/2023) for rfc.    Return in about 3 months (around 02/05/2023) for rfc.   Louann Sjogren, DPM

## 2022-11-05 NOTE — Telephone Encounter (Signed)
Done

## 2022-11-06 ENCOUNTER — Telehealth (HOSPITAL_COMMUNITY): Payer: Self-pay

## 2022-11-06 NOTE — Telephone Encounter (Signed)
Dajiah contacted me reporting that her medications at [facility] were expensive. I contacted [facility] and her eliquis is $146.15- she is in the coverage gap (donut hole) for this. I will work on getting her samples and complete her application for patient assistance. Her Basaglar is $70. She says she can afford that because she needs it. She will pick up Basaglar and I will get samples of Eliquis, I also informed her she needs a print out of what she has spent for the year for [place] to turn in for the eliquis assistance. Malaijah agreed with plan. Call complete.

## 2022-11-08 ENCOUNTER — Other Ambulatory Visit (HOSPITAL_COMMUNITY): Payer: Self-pay

## 2022-11-08 NOTE — Progress Notes (Signed)
Paramedicine Encounter    Patient ID: Danielle [name], female    DOB: Feb 20, 1946, 77 y.o.   MRN: [id number]   Complaints- none   Assessment- CAOX4, warm and dry, slightly short of breath following walking a few laps outdoors at her apartment complex. Lower leg swelling +3 bilaterally. Lungs clear. No chest pain, no dizziness, no shortness of breath once at rest after exercise activity.   Compliance with meds- missed 3 doses of torsemide over the last week   Pill box filled- for one week   Refills needed- none   Meds changes since last visit- none     Social changes- none   Arrived for home visit for [facility] who reports feeling well- she just came inside from walking outdoors and was slightly short of breath after exercising. No shortness of breath at rest. No chest pain, no dizziness. She had some lower leg swelling +3 noted on assessment. Vitals obtained as noted. Meds reviewed and pill box. No refills needed. Appointments confirmed. We discussed HF management with diet, keeping legs elevated and using compression socks. We also discussed diabetic management. Home visit complete.      BP 100/68   Pulse 78   Resp 16   Wt 204 lb (92.5 kg)   SpO2 97%   BMI 35.02 kg/m  Weight yesterday-didn't weigh  Last visit weight-- 206lbs   [name], EMT-Paramedic [phone]  ACTION: Home visit completed    Patient Care Team: Renford Dills, MD as PCP - General (Internal Medicine) Regan Lemming, MD as PCP - Electrophysiology (Cardiology) Quintella Reichert, MD as PCP - Sleep Medicine (Cardiology) Bensimhon, Bevelyn Buckles, MD as PCP - Cardiology (Cardiology) Burna Sis, LCSW as Social Worker (Licensed Clinical Social Worker) Shamleffer, Konrad Dolores, MD as Consulting Physician (Endocrinology)  Patient Active Problem List   Diagnosis Date Noted   Chronic systolic heart failure (HCC) 05/08/2022   Type 2 diabetes mellitus with stage 3a chronic kidney disease, with  long-term current use of insulin (HCC) 02/16/2022   Prosthetic joint implant failure, initial encounter (HCC) 12/20/2021   Pseudophakia of both eyes 11/15/2021   DVT (deep venous thrombosis) (HCC) 07/31/2021   Acute blood loss anemia    Septic Arthritis, Infection of prosthetic right knee joint (HCC), MSSA infection 07/10/2021   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) 07/10/2021   CAD (coronary artery disease) 06/08/2021   Carotid artery disease (HCC) 06/08/2021   Symptomatic bradycardia 06/08/2021   Paroxysmal atrial fibrillation (HCC) 06/08/2021   CKD (chronic kidney disease), stage III (HCC) 06/08/2021   Polyneuropathy associated with underlying disease (HCC) 05/04/2020   Severe nonproliferative diabetic retinopathy of left eye, with macular edema, associated with type 2 diabetes mellitus (HCC) 11/05/2019   Severe nonproliferative diabetic retinopathy of right eye, with macular edema, associated with type 2 diabetes mellitus (HCC) 11/05/2019   Retinal hemorrhage of right eye 11/05/2019   Retinal hemorrhage of left eye 11/05/2019   Diabetes mellitus (HCC) 05/14/2019   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, with long-term current use of insulin (HCC) 05/14/2019   Type 2 diabetes mellitus with diabetic polyneuropathy, with long-term current use of insulin (HCC) 05/14/2019   History of gout 05/20/2018   Primary localized osteoarthritis of right knee 05/13/2018   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) 12/20/2017   Obesity (BMI 30-39.9) 11/11/2017   OSA (obstructive sleep apnea) 02/18/2017   Essential hypertension    L Breast Cancer    Hypothyroidism     Current Outpatient  Medications:    acetaminophen (TYLENOL) 500 MG tablet, Take 500-1,000 mg by mouth every 6 (six) hours as needed (for pain or headaches)., Disp: , Rfl:    allopurinol (ZYLOPRIM) 100 MG tablet, Take 100 mg by mouth daily., Disp: , Rfl:    apixaban (ELIQUIS) 5 MG TABS tablet, Take 1  tablet (5 mg total) by mouth 2 (two) times daily., Disp: 60 tablet, Rfl: 11   aspirin EC 81 MG tablet, Take 1 tablet (81 mg total) by mouth daily. Swallow whole., Disp: 360 tablet, Rfl: 0   atorvastatin (LIPITOR) 80 MG tablet, Take 1 tablet (80 mg total) by mouth at bedtime., Disp: 90 tablet, Rfl: 3   Biotin 10 MG TABS, Take 10 mg by mouth daily., Disp: , Rfl:    Blood Glucose Monitoring Suppl (ONETOUCH VERIO) w/Device KIT, Use to check blood sugar, Disp: 1 kit, Rfl: 0   carvedilol (COREG) 3.125 MG tablet, Take 1 tablet (3.125 mg total) by mouth 2 (two) times daily with a meal., Disp: 60 tablet, Rfl: 0   clotrimazole-betamethasone (LOTRISONE) lotion, Apply 1 application topically 2 (two) times daily as needed (itching)., Disp: , Rfl:    Continuous Blood Gluc Sensor (DEXCOM G6 SENSOR) MISC, 1 Device by Does not apply route as directed., Disp: 9 each, Rfl: 3   empagliflozin (JARDIANCE) 10 MG TABS tablet, Take 1 tablet (10 mg total) by mouth daily., Disp: 30 tablet, Rfl: 11   Ferrous Sulfate (IRON) 325 (65 Fe) MG TABS, Take 1 tablet (325 mg total) by mouth 2 (two) times daily before a meal., Disp: 180 tablet, Rfl: 3   folic acid (FOLVITE) 1 MG tablet, Take 1 tablet (1 mg total) by mouth daily., Disp: 30 tablet, Rfl: 2   gabapentin (NEURONTIN) 300 MG capsule, Take 300 mg by mouth 2 (two) times daily., Disp: , Rfl:    glucose blood (ONETOUCH VERIO) test strip, Check blood sugar 2 times daily, Disp: 100 each, Rfl: 12   Insulin Glargine (BASAGLAR KWIKPEN) 100 UNIT/ML, Inject 36 Units into the skin at bedtime., Disp: 45 mL, Rfl: 0   insulin lispro (HUMALOG) 100 UNIT/ML injection, Inject 0.05 mLs (5 Units total) into the skin 3 (three) times daily before meals., Disp: 10 mL, Rfl: 0   Lancets (ONETOUCH DELICA PLUS LANCET33G) MISC, USE   TO CHECK GLUCOSE 4 TIMES DAILY, Disp: 100 each, Rfl: 3   latanoprost (XALATAN) 0.005 % ophthalmic solution, Place 1 drop into both eyes at bedtime., Disp: , Rfl:     levothyroxine (SYNTHROID, LEVOTHROID) 75 MCG tablet, Take 75 mcg by mouth daily before breakfast., Disp: , Rfl:    LUMIGAN 0.01 % SOLN, Place 1 drop into both eyes at bedtime., Disp: , Rfl:    Multiple Vitamins-Minerals (ALIVE ONCE DAILY WOMENS 50+ PO), Take 1 tablet by mouth daily., Disp: , Rfl:    spironolactone (ALDACTONE) 25 MG tablet, Take 0.5 tablets (12.5 mg total) by mouth daily., Disp: 45 tablet, Rfl: 3   torsemide (DEMADEX) 20 MG tablet, Take 2 tablets (40 mg total) by mouth daily., Disp: 90 tablet, Rfl: 3   Travoprost, BAK Free, (TRAVATAN) 0.004 % SOLN ophthalmic solution, Place 1 drop into both eyes at bedtime., Disp: , Rfl:    celecoxib (CELEBREX) 100 MG capsule, Take 100 mg by mouth 2 (two) times daily. (Patient not taking: Reported on 11/08/2022), Disp: , Rfl:  Allergies  Allergen Reactions   Codeine Other (See Comments)   Other Other (See Comments)    Blood Product Refusal (Jehovah's  witness)     Tape Other (See Comments)    Leaves blisters and marks on the skin   Sulfa Antibiotics Rash   Uloric [Febuxostat] Rash     Social History   Socioeconomic History   Marital status: Widowed    Spouse name: Not on file   Number of children: 3   Years of education: Not on file   Highest education level: Some college, no degree  Occupational History   Occupation: retired  Tobacco Use   Smoking status: Former    Packs/day: 1.00    Years: 22.00    Additional pack years: 0.00    Total pack years: 22.00    Types: Cigarettes    Quit date: 47    Years since quitting: 41.4   Smokeless tobacco: Never  Vaping Use   Vaping Use: Never used  Substance and Sexual Activity   Alcohol use: Yes    Comment: 05/22/2018 "glass of wine couple times/wk"   Drug use: Not Currently   Sexual activity: Not Currently    Birth control/protection: None, Post-menopausal  Other Topics Concern   Not on file  Social History Narrative   Not on file   Social Determinants of Health   Financial  Resource Strain: Medium Risk (12/22/2021)   Overall Financial Resource Strain (CARDIA)    Difficulty of Paying Living Expenses: Somewhat hard  Food Insecurity: No Food Insecurity (05/08/2018)   Hunger Vital Sign    Worried About Running Out of Food in the Last Year: Never true    Ran Out of Food in the Last Year: Never true  Transportation Needs: Unmet Transportation Needs (05/08/2022)   PRAPARE - Administrator, Civil Service (Medical): Yes    Lack of Transportation (Non-Medical): Yes  Physical Activity: Not on file  Stress: Not on file  Social Connections: Not on file  Intimate Partner Violence: Not on file    Physical Exam      Future Appointments  Date Time Provider Department Center  11/09/2022 10:10 AM Shamleffer, Konrad Dolores, MD [facility] None  11/12/2022  2:20 PM Bensimhon, Bevelyn Buckles, MD [facility] None  11/28/2022  2:00 PM [facility] DEVICE REMOTES [facility]  01/16/2023 10:00 AM [facility] V[facility] 6 - [facility]  01/16/2023 10:30 AM [facility] 6 - [facility]  01/16/2023 11:20 AM Maeola Harman, MD [facility]  02/05/2023 11:15 AM Louann Sjogren, DPM [facility]  02/22/2023 11:30 AM Shamleffer, Konrad Dolores, MD [facility] None  02/27/2023  2:00 PM [facility] DEVICE REMOTES [facility]  05/30/2023  7:00 AM [facility] DEVICE REMOTES [facility]  08/28/2023  2:00 PM [facility] DEVICE REMOTES [facility]  11/27/2023  2:00 PM [facility] DEVICE REMOTES [facility]  02/26/2024  2:00 PM [facility] DEVICE REMOTES [facility]  05/27/2024  2:00 PM [facility] DEVICE REMOTES [facility]

## 2022-11-09 ENCOUNTER — Encounter: Payer: Self-pay | Admitting: Internal Medicine

## 2022-11-09 ENCOUNTER — Ambulatory Visit: Payer: Medicare HMO | Admitting: Internal Medicine

## 2022-11-09 VITALS — BP 136/84 | HR 78 | Wt 204.0 lb

## 2022-11-09 DIAGNOSIS — N1831 Chronic kidney disease, stage 3a: Secondary | ICD-10-CM | POA: Diagnosis not present

## 2022-11-09 DIAGNOSIS — E1165 Type 2 diabetes mellitus with hyperglycemia: Secondary | ICD-10-CM | POA: Diagnosis not present

## 2022-11-09 DIAGNOSIS — E1122 Type 2 diabetes mellitus with diabetic chronic kidney disease: Secondary | ICD-10-CM | POA: Diagnosis not present

## 2022-11-09 DIAGNOSIS — E113599 Type 2 diabetes mellitus with proliferative diabetic retinopathy without macular edema, unspecified eye: Secondary | ICD-10-CM | POA: Diagnosis not present

## 2022-11-09 DIAGNOSIS — E1159 Type 2 diabetes mellitus with other circulatory complications: Secondary | ICD-10-CM

## 2022-11-09 DIAGNOSIS — Z794 Long term (current) use of insulin: Secondary | ICD-10-CM

## 2022-11-09 LAB — POCT GLUCOSE (DEVICE FOR HOME USE): Glucose Fasting, POC: 275 mg/dL — AB (ref 70–99)

## 2022-11-09 LAB — POCT GLYCOSYLATED HEMOGLOBIN (HGB A1C): Hemoglobin A1C: 10.2 % — AB (ref 4.0–5.6)

## 2022-11-09 MED ORDER — BASAGLAR KWIKPEN 100 UNIT/ML ~~LOC~~ SOPN: 40.0000 [IU] | PEN_INJECTOR | Freq: Every day | SUBCUTANEOUS | 2 refills | Status: AC

## 2022-11-09 MED ORDER — INSULIN PEN NEEDLE 32G X 4 MM MISC: 1.0000 | Freq: Four times a day (QID) | 3 refills | Status: AC

## 2022-11-09 MED ORDER — INSULIN LISPRO (1 UNIT DIAL) 100 UNIT/ML (KWIKPEN): PEN_INJECTOR | SUBCUTANEOUS | 2 refills | Status: DC

## 2022-11-09 NOTE — Patient Instructions (Addendum)
-   Increase  Basaglar 40 units ONCE daily  - Continue Jardiance 10 mg daily  - Take HUmalog 8 units with each meal PLUS adding on as below  -Humalog correctional insulin:Use the scale below to help guide you before meals   Blood sugar before meal Number of units to inject  Less than 165 0 unit  166 - 190 1 units  191 - 215 2 units  216 - 240 3 units  241- 265 4 units     HOW TO TREAT LOW BLOOD SUGARS (Blood sugar LESS THAN 70 MG/DL) Please follow the RULE OF 15 for the treatment of hypoglycemia treatment (when your (blood sugars are less than 70 mg/dL)   STEP 1: Take 15 grams of carbohydrates when your blood sugar is low, which includes:  3-4 GLUCOSE TABS  OR 3-4 OZ OF JUICE OR REGULAR SODA OR ONE TUBE OF GLUCOSE GEL    STEP 2: RECHECK blood sugar in 15 MINUTES STEP 3: If your blood sugar is still low at the 15 minute recheck --> then, go back to STEP 1 and treat AGAIN with another 15 grams of carbohydrates.

## 2022-11-09 NOTE — Progress Notes (Signed)
Name: Danielle [name]  Age/ Sex: 77 y.o., female   MRN/ DOB: [id number], 1946-05-19     PCP: Renford Dills, MD   Reason for Endocrinology Evaluation: Type 2 Diabetes Mellitus  Initial Endocrine Consultative Visit: 05/14/2019    PATIENT IDENTIFIER: Danielle [name] is a 77 y.o. female with a past medical history of T2DM, HTN, Dyslipidemia, CAD, Hx of Breast Ca (S/P chemo and lumpectomy)  . The patient has followed with Endocrinology clinic since 05/14/2019 for consultative assistance with management of her diabetes.  DIABETIC HISTORY:  Danielle [name] was diagnosed with T2DM > 20 yrs ago. Has been on  Metformin in the past which caused diarrhea, has been on prandial insulin in the past  . Her hemoglobin A1c has ranged from 6.9% in 2019, peaking at 8.0% in 2020.  On her initial visit to our clinic her A1c was 11.2% . She was on Jardiance and Lantus 130 units daily. I reduced her lantus to 80 units, added prandial insulin.   Jardriance initiated  By cardiology 09/2019  SUBJECTIVE:   During the last visit (08/15/2021): A1c 8.6 %.    Today (11/09/2022): Danielle [name] is here for a follow up on diabetes management.  She has not had the Dexcom in over a month  She has been checking glucose at home occasionally  She continues to follow-up with cardiology for CHF, they are not opposed to increasing Jardiance if needed by endocrinology  She has been following up with podiatry 11/05/2022 for left second toe wound S/p cannulation of the right common femoral artery, laser arthrectomy of left popliteal and anterior tibial artery with balloon angioplasty 07/2022   HOME DIABETES REGIMEN:  Basaglar 45 units daily-taking 36 units Jardiance 10 mg daily  Humalog 6 units TIDQAC -takes 10-15 u CF : Humalog (BG -130/25) -questionable intake      Statin: Yes ACE-I/ARB:Yes   CGM : unable to download       DIABETIC COMPLICATIONS: Microvascular complications:  Neuropathy, CKD III, 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  (S/P laser)  Last eye exam: Completed 07/06/2020    Macrovascular complications:  CAD ( S/P MI and stent placement), PVD Denies: CVA     HISTORY:  Past Medical History:  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HCC) 02/05/2017   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 05/20/2018   Acute kidney failure (HCC) 05/15/2018   Arthritis    "all over" (05/22/2018)   Blood dyscrasia    per pt-has small blood cells-appears as if anemic   Breast cancer, left breast (HCC) 1985   CHF (congestive heart failure) (HCC)    Complication of anesthesia    difficult to awaken from per pt   Coronary artery disease    Dyspnea    Essential hypertension    Heart disease    Heart murmur    History of gout    Hypothyroidism    Obesity (BMI 30-39.9) 11/11/2017   OSA (obstructive sleep apnea) 02/18/2017    severe obstructive sleep apnea with an AHI of 75.6/h and mild central sleep apnea with a CAI of 7.7/h.  Oxygen saturations dropped as low as 82%.   He is on CPAP at 9 cm H2O.   OSA on CPAP    Personal history of chemotherapy    Personal history of radiation therapy    Presence of permanent cardiac pacemaker    Primary localized osteoarthritis of right knee  05/13/2018   Refusal of blood transfusions as patient is Jehovah's Witness    Type II diabetes mellitus (HCC)    Past Surgical History:  Past Surgical History:  Procedure Laterality Date   ABDOMINAL AORTOGRAM W/LOWER EXTREMITY N/A 07/30/2022   Procedure: ABDOMINAL AORTOGRAM W/LOWER EXTREMITY;  Surgeon: Maeola Harman, MD;  Location: [facility] INVASIVE CV LAB;  Service: Cardiovascular;  Laterality: N/A;   APPLICATION OF WOUND VAC Right 04/14/2021   Procedure: APPLICATION OF WOUND VAC;  Surgeon: Joen Laura, MD;  Location: [facility] OR;  Service: Orthopedics;  Laterality: Right;   BREAST BIOPSY Left 1985   BREAST LUMPECTOMY Left  1985   BREAST LUMPECTOMY WITH NEEDLE LOCALIZATION AND AXILLARY LYMPH NODE DISSECTION  1985   CATARACT EXTRACTION W/ INTRAOCULAR LENS  IMPLANT, BILATERAL Bilateral    CORONARY ANGIOPLASTY WITH STENT PLACEMENT  2010, 2012,2017    2 done 2010 and 1 replaced 2012 and 2 replaced in 2017   DILATION AND CURETTAGE OF UTERUS     EXCISIONAL TOTAL KNEE ARTHROPLASTY WITH ANTIBIOTIC SPACERS Right 07/12/2021   Procedure: EXCISIONAL TOTAL KNEE ARTHROPLASTY WITH ANTIBIOTIC SPACERS;  Surgeon: Joen Laura, MD;  Location: [facility] OR;  Service: Orthopedics;  Laterality: Right;   I & D KNEE WITH POLY EXCHANGE Right 04/14/2021   Procedure: IRRIGATION AND DEBRIDEMENT KNEE WITH POLY EXCHANGE;  Surgeon: Joen Laura, MD;  Location: [facility] OR;  Service: Orthopedics;  Laterality: Right;   INSERT / REPLACE / REMOVE PACEMAKER  07/19/2014   JOINT REPLACEMENT     PERIPHERAL VASCULAR ATHERECTOMY  07/30/2022   Procedure: PERIPHERAL VASCULAR ATHERECTOMY;  Surgeon: Maeola Harman, MD;  Location: [facility] INVASIVE CV LAB;  Service: Cardiovascular;;   PERIPHERAL VASCULAR INTERVENTION  07/30/2022   Procedure: PERIPHERAL VASCULAR INTERVENTION;  Surgeon: Maeola Harman, MD;  Location: [facility] INVASIVE CV LAB;  Service: Cardiovascular;;   REPLACEMENT TOTAL KNEE Left 09/2012   THYROIDECTOMY, PARTIAL  1978   TONSILLECTOMY     TOTAL KNEE ARTHROPLASTY Right 05/13/2018   Procedure: TOTAL KNEE ARTHROPLASTY;  Surgeon: Teryl Lucy, MD;  Location: [facility] ORS;  Service: Orthopedics;  Laterality: Right;   TOTAL KNEE REVISION Right 12/20/2021   Procedure: REMOVAL OF RIGHT KNEE ANTIBIOTIC SPACER AND REVISION RIGHT KNEE TOTAL ARTHROPLASTY;  Surgeon: Joen Laura, MD;  Location: [facility] OR;  Service: Orthopedics;  Laterality: Right;   TUBAL LIGATION     Social History:  reports that she quit smoking about 41 years ago. Her smoking use included cigarettes. She has a 22.00 pack-year smoking history. She has never used smokeless  tobacco. She reports current alcohol use. She reports that she does not currently use drugs. Family History:  Family History  Problem Relation Age of Onset   Multiple myeloma Mother    Heart disease Father    Other Sister    Heart disease Brother    Diabetes Sister    Other Sister      HOME MEDICATIONS: Allergies as of 11/09/2022       Reactions   Codeine Other (See Comments)   Other Other (See Comments)   Blood Product Refusal (Jehovah's witness)   Tape Other (See Comments)   Leaves blisters and marks on the skin   Sulfa Antibiotics Rash   Uloric [febuxostat] Rash        Medication List        Accurate as of November 09, 2022 10:18 AM. If you have any questions, ask your nurse or doctor.  acetaminophen 500 MG tablet Commonly known as: TYLENOL Take 500-1,000 mg by mouth every 6 (six) hours as needed (for pain or headaches).   ALIVE ONCE DAILY WOMENS 50+ PO Take 1 tablet by mouth daily.   allopurinol 100 MG tablet Commonly known as: ZYLOPRIM Take 100 mg by mouth daily.   apixaban 5 MG Tabs tablet Commonly known as: Eliquis Take 1 tablet (5 mg total) by mouth 2 (two) times daily.   aspirin EC 81 MG tablet Take 1 tablet (81 mg total) by mouth daily. Swallow whole.   atorvastatin 80 MG tablet Commonly known as: LIPITOR Take 1 tablet (80 mg total) by mouth at bedtime.   B-COMPLEX PO Take by mouth.   Basaglar KwikPen 100 UNIT/ML Inject 36 Units into the skin at bedtime.   Biotin 10 MG Tabs Take 10 mg by mouth daily.   carvedilol 3.125 MG tablet Commonly known as: COREG Take 1 tablet (3.125 mg total) by mouth 2 (two) times daily with a meal.   celecoxib 100 MG capsule Commonly known as: CELEBREX Take 100 mg by mouth 2 (two) times daily.   clotrimazole-betamethasone lotion Commonly known as: LOTRISONE Apply 1 application topically 2 (two) times daily as needed (itching).   Dexcom G6 Sensor Misc 1 Device by Does not apply route as  directed.   empagliflozin 10 MG Tabs tablet Commonly known as: Jardiance Take 1 tablet (10 mg total) by mouth daily.   folic acid 1 MG tablet Commonly known as: FOLVITE Take 1 tablet (1 mg total) by mouth daily.   gabapentin 300 MG capsule Commonly known as: NEURONTIN Take 300 mg by mouth 2 (two) times daily.   insulin lispro 100 UNIT/ML injection Commonly known as: HUMALOG Inject 0.05 mLs (5 Units total) into the skin 3 (three) times daily before meals.   Iron 325 (65 Fe) MG Tabs Take 1 tablet (325 mg total) by mouth 2 (two) times daily before a meal.   latanoprost 0.005 % ophthalmic solution Commonly known as: XALATAN Place 1 drop into both eyes at bedtime.   levothyroxine 75 MCG tablet Commonly known as: SYNTHROID Take 75 mcg by mouth daily before breakfast.   Lumigan 0.01 % Soln Generic drug: bimatoprost Place 1 drop into both eyes at bedtime.   OneTouch Delica Plus Lancet33G Misc USE   TO CHECK GLUCOSE 4 TIMES DAILY   OneTouch Verio test strip Generic drug: glucose blood Check blood sugar 2 times daily   OneTouch Verio w/Device Kit Use to check blood sugar   spironolactone 25 MG tablet Commonly known as: ALDACTONE Take 0.5 tablets (12.5 mg total) by mouth daily.   torsemide 20 MG tablet Commonly known as: DEMADEX Take 2 tablets (40 mg total) by mouth daily.   Travoprost (BAK Free) 0.004 % Soln ophthalmic solution Commonly known as: TRAVATAN Place 1 drop into both eyes at bedtime.         OBJECTIVE:   Vital Signs: BP 136/84 (BP Location: Left Arm, Patient Position: Sitting, Cuff Size: Large)   Pulse 78   Wt 204 lb (92.5 kg) Comment: patient reported  SpO2 95%   BMI 35.02 kg/m   Wt Readings from Last 3 Encounters:  11/09/22 204 lb (92.5 kg)  11/08/22 204 lb (92.5 kg)  11/01/22 206 lb (93.4 kg)     EXAM: General: Pt appears well and is in NAD Bilateral cerumen impaction noted  Heart: RRR , + systolic murmur  Lungs: CTA  Extremities:   BL LE: 1+  pretibial  edema   Mental Status: Judgment, insight: intact Orientation: oriented to time, place, and person Mood and affect: no depression, anxiety, or agitation   DM Foot Exam per podiatry6/02/2023     DATA REVIEWED:   Lab Results  Component Value Date   HGBA1C 10.2 (A) 11/09/2022   HGBA1C 8.6 (A) 08/16/2022   HGBA1C 6.0 (A) 02/15/2022     Latest Reference Range & Units 09/25/22 15:12  Sodium 135 - 145 mmol/L 136  Potassium 3.5 - 5.1 mmol/L 4.0  Chloride 98 - 111 mmol/L 96 (L)  CO2 22 - 32 mmol/L 29  Glucose 70 - 99 mg/dL 161 (H)  BUN 8 - 23 mg/dL 40 (H)  Creatinine 0.96 - 1.00 mg/dL 0.45 (H)  Calcium 8.9 - 10.3 mg/dL 40.9  Anion gap 5 - 15  11  GFR, Estimated >60 mL/min 28 (L)  (L): Data is abnormally low (H): Data is abnormally high   ASSESSMENT / PLAN / RECOMMENDATIONS:   1) Type 2 Diabetes Mellitus, Neuropathic, CKD III and Macrovascular  complications - Most recent A1c of 10.2%. Goal A1c < 7.5 %.   -A1c continues to worsen -She has not used the Dexcom in over a month, prior to that we have not been able to access that information, she does not check glucose on a regular basis, upon reviewing glucose meter, the patient has been checking once every other day or so  -I do believe that she does take her insulin haphazardly -On the last visit she told me she was taking Basaglar 45 units once daily, today she tells me she has been taking 36 units daily -When I asked her how much Humalog she took last night with supper, she took 10 units, of note, on her last visit here she told me she was taking 5 units of Humalog with meals, so I asked her to change it to 6 units -The patient tells me she has been using the correction scale and she may go up to 15 units, but her correction scale does not go all the way up to 15 units in the past, and there was no glucose reading from last night and her meter at suppertime to indicate that her baseline was 6 units plus a  correction -Unfortunately, with haphazard intake of insulin she will continue to have glycemic excursions -Unable to increase Jardiance due to a GFR <35  -I have advised the patient to check glucose 3 times a day before meals -She continues to take Humalog after meals, patient advised again to take Humalog before the meal -She again declines GLP-1 agonist     MEDICATIONS: Increase Basaglar 40 units ONCE daily  Continue  Jardiance 10 mg daily per cardiology Take  Humalog 8 units with each meal Take  CF: Humalog ( BG - 140/25) 3 times daily before every meal  EDUCATION / INSTRUCTIONS: BG monitoring instructions: Patient is instructed to check her blood sugars 4 times a day, before each meal and bedtime. Call [facility] if: BG persistently < 70 I reviewed the Rule of 15 for the treatment of hypoglycemia in detail with the patient. Literature supplied.   F/U in 3 months    I spent 26 minutes preparing to see the patient by review of recent labs, imaging and procedures, obtaining and reviewing separately obtained history, communicating with the patient/family or caregiver, ordering medications, tests or procedures, and documenting clinical information in the EHR including the differential Dx, treatment, and any further evaluation and other  management    Signed electronically by: Lyndle Herrlich, MD  [facility]  [facility] [address] [place], [place] [address] Phone: [phone] FAX: [phone]   CC: Renford Dills, MD [address] [place] [address] Phone: [phone]  Fax: [phone]  Return to Endocrinology clinic as below: Future Appointments  Date Time Provider Department Center  11/12/2022  2:20 PM Bensimhon, Bevelyn Buckles, MD [facility] None  11/28/2022  2:00 PM [facility] DEVICE REMOTES [facility]  01/16/2023 10:00 AM [facility] VASC 6 - MK [facility]  01/16/2023 10:30 AM  [facility] VASC 6 - [facility]  01/16/2023 11:20 AM Maeola Harman, MD [facility]  02/05/2023 11:15 AM Louann Sjogren, DPM [facility]  02/22/2023 11:30 AM Tayden Nichelson, Konrad Dolores, MD [facility] None  02/27/2023  2:00 PM [facility] DEVICE REMOTES [facility]  05/30/2023  7:00 AM [facility] DEVICE REMOTES [facility]  08/28/2023  2:00 PM [facility] DEVICE REMOTES [facility]  11/27/2023  2:00 PM [facility] DEVICE REMOTES [facility]  02/26/2024  2:00 PM [facility] DEVICE REMOTES [facility]  05/27/2024  2:00 PM [facility] DEVICE REMOTES [facility]

## 2022-11-12 ENCOUNTER — Telehealth (HOSPITAL_COMMUNITY): Payer: Self-pay

## 2022-11-12 ENCOUNTER — Encounter (HOSPITAL_COMMUNITY): Payer: Self-pay | Admitting: Internal Medicine

## 2022-11-12 ENCOUNTER — Other Ambulatory Visit (HOSPITAL_COMMUNITY): Payer: Self-pay

## 2022-11-12 ENCOUNTER — Other Ambulatory Visit (HOSPITAL_COMMUNITY): Payer: Self-pay | Admitting: Internal Medicine

## 2022-11-12 ENCOUNTER — Ambulatory Visit (HOSPITAL_COMMUNITY)
Admission: RE | Admit: 2022-11-12 | Discharge: 2022-11-12 | Disposition: A | Discharge status: Home / Self Care | Payer: Medicare HMO | Source: Ambulatory Visit | Attending: Internal Medicine | Admitting: Internal Medicine

## 2022-11-12 VITALS — BP 100/70 | HR 70 | Wt 206.4 lb

## 2022-11-12 DIAGNOSIS — N1832 Chronic kidney disease, stage 3b: Secondary | ICD-10-CM | POA: Diagnosis not present

## 2022-11-12 DIAGNOSIS — E039 Hypothyroidism, unspecified: Secondary | ICD-10-CM | POA: Diagnosis not present

## 2022-11-12 DIAGNOSIS — I10 Essential (primary) hypertension: Secondary | ICD-10-CM

## 2022-11-12 DIAGNOSIS — I2510 Atherosclerotic heart disease of native coronary artery without angina pectoris: Secondary | ICD-10-CM | POA: Insufficient documentation

## 2022-11-12 DIAGNOSIS — Z96651 Presence of right artificial knee joint: Secondary | ICD-10-CM | POA: Diagnosis not present

## 2022-11-12 DIAGNOSIS — E669 Obesity, unspecified: Secondary | ICD-10-CM | POA: Insufficient documentation

## 2022-11-12 DIAGNOSIS — Z7984 Long term (current) use of oral hypoglycemic drugs: Secondary | ICD-10-CM | POA: Diagnosis not present

## 2022-11-12 DIAGNOSIS — Z955 Presence of coronary angioplasty implant and graft: Secondary | ICD-10-CM | POA: Diagnosis not present

## 2022-11-12 DIAGNOSIS — Z79899 Other long term (current) drug therapy: Secondary | ICD-10-CM | POA: Diagnosis not present

## 2022-11-12 DIAGNOSIS — I5022 Chronic systolic (congestive) heart failure: Secondary | ICD-10-CM | POA: Insufficient documentation

## 2022-11-12 DIAGNOSIS — Z950 Presence of cardiac pacemaker: Secondary | ICD-10-CM | POA: Diagnosis not present

## 2022-11-12 DIAGNOSIS — Z794 Long term (current) use of insulin: Secondary | ICD-10-CM | POA: Insufficient documentation

## 2022-11-12 DIAGNOSIS — Z6835 Body mass index (BMI) 35.0-35.9, adult: Secondary | ICD-10-CM | POA: Insufficient documentation

## 2022-11-12 DIAGNOSIS — E1122 Type 2 diabetes mellitus with diabetic chronic kidney disease: Secondary | ICD-10-CM | POA: Insufficient documentation

## 2022-11-12 DIAGNOSIS — Z7901 Long term (current) use of anticoagulants: Secondary | ICD-10-CM | POA: Diagnosis not present

## 2022-11-12 DIAGNOSIS — G4733 Obstructive sleep apnea (adult) (pediatric): Secondary | ICD-10-CM | POA: Diagnosis not present

## 2022-11-12 DIAGNOSIS — I480 Paroxysmal atrial fibrillation: Secondary | ICD-10-CM | POA: Insufficient documentation

## 2022-11-12 DIAGNOSIS — I130 Hypertensive heart and chronic kidney disease with heart failure and stage 1 through stage 4 chronic kidney disease, or unspecified chronic kidney disease: Secondary | ICD-10-CM | POA: Diagnosis not present

## 2022-11-12 DIAGNOSIS — Z79890 Hormone replacement therapy (postmenopausal): Secondary | ICD-10-CM | POA: Insufficient documentation

## 2022-11-12 DIAGNOSIS — I255 Ischemic cardiomyopathy: Secondary | ICD-10-CM | POA: Insufficient documentation

## 2022-11-12 NOTE — Telephone Encounter (Signed)
[facility] is needing a refill for Carvedilol for Mrs. Danielle [name] please. Thanks!   [name], EMT-Paramedic [phone] 11/12/2022

## 2022-11-12 NOTE — Progress Notes (Signed)
ReDS Vest / Clip - 11/12/22 1400       ReDS Vest / Clip   Station Marker A    Ruler Value 31    ReDS Value Range Moderate volume overload    ReDS Actual Value 38

## 2022-11-12 NOTE — Progress Notes (Signed)
Paramedicine Encounter  Patient ID: Danielle [name], female, DOB: 04-27-1946, 77 y.o.,  MRN: [id number]  Met patient in clinic today with Dr. Gala Romney. She reports feeling okay with no complaints today other than some lower leg swelling. Bensimhon gave her prescription for compression stockings. No med changes. I reviewed meds and filled pill box for one week.  A1C is 10.2 per Endo.  Encouraged exercise and compliance with diet and using insulin. She verbalized agreement and will be looking into exercise programs at the [facility]. I reminded her of upcoming visits and wrote down same. I plan to see [name] in one week.    Weight @ clinic-206lbs B/P-100/70 P-70 SP02-95% REDS CLIP- 38%  Med changes: None      [name], EMT-Paramedic [phone] 11/12/2022

## 2022-11-12 NOTE — Patient Instructions (Signed)
There has been no changes to your medications.  PLEASE WEAR YOUR COMPRESSION STOCKINGS.  Your physician recommends that you schedule a follow-up appointment in: 3 months  If you have any questions or concerns before your next appointment please send [place] a message through [place] or call our office at [phone].    TO LEAVE A MESSAGE FOR THE NURSE SELECT OPTION 2, PLEASE LEAVE A MESSAGE INCLUDING: YOUR NAME DATE OF BIRTH CALL BACK NUMBER REASON FOR CALL**this is important as we prioritize the call backs  YOU WILL RECEIVE A CALL BACK THE SAME DAY AS LONG AS YOU CALL BEFORE 4:00 PM  At the [facility], you and your health needs are our priority. As part of our continuing mission to provide you with exceptional heart care, we have created designated Provider Care Teams. These Care Teams include your primary Cardiologist (physician) and Advanced Practice Providers (APPs- Physician Assistants and Nurse Practitioners) who all work together to provide you with the care you need, when you need it.   You may see any of the following providers on your designated Care Team at your next follow up: Dr Arvilla Meres Dr Marca Ancona Dr. Marcos Eke, NP Robbie Lis, [place] [facility] [place], [place] Brynda Peon, NP Karle Plumber, PharmD   Please be sure to bring in all your medications bottles to every appointment.    Thank you for choosing [facility]

## 2022-11-12 NOTE — Progress Notes (Signed)
[facility]   PCP: Renford Dills, MD EP: Dr. Elberta Fortis Rheumatology: Dr. Lennette Bihari HF Cardiologist: Dr. Gala Romney  HPI: Danielle [name] is a 77 y.o.female with a past medical history of systolic HF (EF 45-50%), CAD s/p multiple stents in [place] (with h/o LAD stent thrombosis), ischemic cardiomyopathy (EF 10% at one point), symptomatic bradycardia s/p PPM, HTN, DM, hypothyroidism, and OSA.   Admitted 9/18 with SOB, acute on chronic systolic CHF. Diuresed with IV lasix (12 pounds), discharge weight was 215 pounds.    Had R TKR 12/19.    Admitted 11/22 with right knee septic arthritis. Arthrocentesis culture grew staph aureus.  Underwent I&D and polyexchange of infected right total knee arthroplasty.  Treated with 6 weeks IV antibiotics followed by suppressive abx. Aldactone held, losartan reduced to 25 mg daily and coreg reduced to 12.5 mg BID d/t soft BP. She was given 1 dose of furosemide on admission due to leg edema but this was held later due to AKI.  Torsemide was resumed at discharge.  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  Patient underwent explant right total knee arthroplasty with implantation of articulating antibiotic spacer by Dr. Blanchie Dessert on 07/12/2021. Placed on IV abx. Was discharged to SNF. During hospitalization was very anemic, but improving.   Echo 2/23 EF 40-45%   Follow up 4/23, unsure what meds she was taking. Re-enrolled in paramedicine.   Had knee re-replaced 12/20/21  Echo 05/08/22 showed EF 45-50%, mildly decreased LV with moderate LVH, grade II DD, RV ok, mild MR with severe MAC.  Today she returns for HF follow up with Danielle [name] - HF Paramedic.  Taking torsemide 40 daily. Weight was up to 216 but now down to 204. No CP. Gets around with a walker. Breathing is "good". Walking for 30 mins daily. + Ankle edema. No orthopnea or PND. Compliant with meds. Last A1c 11.4. Follows with Endo.    Cardiac  Studies: Echo (9/18): EF 45-50% Grade 2DD Left Atrium Severely dilated.  Echo (8/19): EF 45-50%, G2DD, LA mildly dilated, trileaflet moderately thick aortic valve Echo (12/21): EF 45-50% G2DD  Echo (2/23): EF 40-45%  Echo (12/23): EF 45-50%  ROS: All systems negative except as listed in HPI, PMH and Problem List.  SH:  Social History   Socioeconomic History   Marital status: Widowed    Spouse name: Not on file   Number of children: 3   Years of education: Not on file   Highest education level: Some college, no degree  Occupational History   Occupation: retired  Tobacco Use   Smoking status: Former    Packs/day: 1.00    Years: 22.00    Additional pack years: 0.00    Total pack years: 22.00    Types: Cigarettes    Quit date: 81    Years since quitting: 41.4   Smokeless tobacco: Never  Vaping Use   Vaping Use: Never used  Substance and Sexual Activity   Alcohol use: Yes    Comment: 05/22/2018 "glass of wine couple times/wk"   Drug use: Not Currently   Sexual activity: Not Currently    Birth control/protection: None, Post-menopausal  Other Topics Concern   Not on file  Social History Narrative   Not on file   Social Determinants of Health   Financial Resource Strain: Medium Risk (12/22/2021)   Overall Financial Resource Strain (CARDIA)    Difficulty of Paying Living Expenses: Somewhat hard  Food Insecurity: No Food Insecurity (05/08/2018)  Hunger Vital Sign    Worried About Running Out of Food in the Last Year: Never true    Ran Out of Food in the Last Year: Never true  Transportation Needs: Unmet Transportation Needs (05/08/2022)   PRAPARE - Administrator, Civil Service (Medical): Yes    Lack of Transportation (Non-Medical): Yes  Physical Activity: Not on file  Stress: Not on file  Social Connections: Not on file  Intimate Partner Violence: Not on file    FH:  Family History  Problem Relation Age of Onset   Multiple myeloma Mother    Heart  disease Father    Other Sister    Heart disease Brother    Diabetes Sister    Other Sister     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HCC) 02/05/2017   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 05/20/2018   Acute kidney failure (HCC) 05/15/2018   Arthritis    "all over" (05/22/2018)   Blood dyscrasia    per pt-has small blood cells-appears as if anemic   Breast cancer, left breast (HCC) 1985   CHF (congestive heart failure) (HCC)    Complication of anesthesia    difficult to awaken from per pt   Coronary artery disease    Dyspnea    Essential hypertension    Heart disease    Heart murmur    History of gout    Hypothyroidism    Obesity (BMI 30-39.9) 11/11/2017   OSA (obstructive sleep apnea) 02/18/2017    severe obstructive sleep apnea with an AHI of 75.6/h and mild central sleep apnea with a CAI of 7.7/h.  Oxygen saturations dropped as low as 82%.   He is on CPAP at 9 cm H2O.   OSA on CPAP    Personal history of chemotherapy    Personal history of radiation therapy    Presence of permanent cardiac pacemaker    Primary localized osteoarthritis of right knee 05/13/2018   Refusal of blood transfusions as patient is Jehovah's Witness    Type II diabetes mellitus (HCC)    Current Outpatient Medications  Medication Sig Dispense Refill   acetaminophen (TYLENOL) 500 MG tablet Take 500-1,000 mg by mouth every 6 (six) hours as needed (for pain or headaches).     allopurinol (ZYLOPRIM) 100 MG tablet Take 100 mg by mouth daily.     apixaban (ELIQUIS) 5 MG TABS tablet Take 1 tablet (5 mg total) by mouth 2 (two) times daily. 60 tablet 11   aspirin EC 81 MG tablet Take 1 tablet (81 mg total) by mouth daily. Swallow whole. 360 tablet 0   atorvastatin (LIPITOR) 80 MG tablet Take 1 tablet (80 mg total) by mouth at bedtime. 90 tablet 3   B Complex-Biotin-FA (B-COMPLEX PO) Take by mouth.     Biotin 10 MG TABS Take  10 mg by mouth daily.     Blood Glucose Monitoring Suppl (ONETOUCH VERIO) w/Device KIT Use to check blood sugar 1 kit 0   carvedilol (COREG) 3.125 MG tablet Take 1 tablet (3.125 mg total) by mouth 2 (two) times daily with a meal. 60 tablet 0   clotrimazole-betamethasone (LOTRISONE) lotion Apply 1 application topically 2 (two) times daily as needed (itching).     Continuous Blood Gluc Sensor (DEXCOM G6 SENSOR) MISC 1 Device by Does not apply route as directed. 9 each 3   empagliflozin (JARDIANCE) 10 MG TABS tablet  Take 1 tablet (10 mg total) by mouth daily. 30 tablet 11   ferrous sulfate 325 (65 FE) MG tablet Take 325 mg by mouth 3 (three) times a week.     folic acid (FOLVITE) 1 MG tablet Take 1 tablet (1 mg total) by mouth daily. 30 tablet 2   gabapentin (NEURONTIN) 300 MG capsule Take 300 mg by mouth 2 (two) times daily.     glucose blood (ONETOUCH VERIO) test strip Check blood sugar 2 times daily 100 each 12   Insulin Glargine (BASAGLAR KWIKPEN) 100 UNIT/ML Inject 40 Units into the skin at bedtime. 45 mL 2   Insulin Lispro (HUMALOG [place]) Inject 8 Units into the skin in the morning, at noon, and at bedtime.     Insulin Pen Needle 32G X 4 MM MISC 1 Device by Does not apply route in the morning, at noon, in the evening, and at bedtime. 400 each 3   Lancets (ONETOUCH DELICA PLUS LANCET33G) MISC USE   TO CHECK GLUCOSE 4 TIMES DAILY 100 each 3   latanoprost (XALATAN) 0.005 % ophthalmic solution Place 1 drop into both eyes at bedtime.     levothyroxine (SYNTHROID, LEVOTHROID) 75 MCG tablet Take 75 mcg by mouth daily before breakfast.     LUMIGAN 0.01 % SOLN Place 1 drop into both eyes at bedtime.     Multiple Vitamins-Minerals (ALIVE ONCE DAILY WOMENS 50+ PO) Take 1 tablet by mouth daily.     spironolactone (ALDACTONE) 25 MG tablet Take 0.5 tablets (12.5 mg total) by mouth daily. 45 tablet 3   torsemide (DEMADEX) 20 MG tablet Take 2 tablets (40 mg total) by mouth daily. 90 tablet 3   Travoprost, BAK  Free, (TRAVATAN) 0.004 % SOLN ophthalmic solution Place 1 drop into both eyes at bedtime.     No current facility-administered medications for this encounter.   BP 100/70   Pulse 70   Wt 93.6 kg (206 lb 6.4 oz)   SpO2 95%   BMI 35.43 kg/m   Wt Readings from Last 3 Encounters:  11/12/22 93.6 kg (206 lb 6.4 oz)  11/09/22 92.5 kg (204 lb)  11/08/22 92.5 kg (204 lb)   PHYSICAL EXAM: General:  Well appearing. No resp difficulty HEENT: normal Neck: supple. no JVD. Carotids 2+ bilat; no bruits. No lymphadenopathy or thryomegaly appreciated. Cor: PMI nondisplaced. Regular rate & rhythm. No rubs, gallops or murmurs. Lungs: clear Abdomen: obesesoft, nontender, nondistended. No hepatosplenomegaly. No bruits or masses. Good bowel sounds. Extremities: no cyanosis, clubbing, rash, 1-2+ ankle edema Neuro: alert & orientedx3, cranial nerves grossly intact. moves all 4 extremities w/o difficulty. Affect pleasant  ReDS 38%  ASSESSMENT & PLAN: 1. Chronic systolic CHF: ICM - Echo (12/21): EF 45-50% G2DD  - Echo (2/23): EF 40-45%  - Echo 05/08/22 with EF 45-50%, moderate LVH grade II DD, RV normal, severe MAC. We discussed results today. - Improved NYHA II. Overall volume status looks good but still with some pedal edema. Encouraged her to wear support stockings. Will not push diuretics currently - ReDS 38% - Continue torsemide 40 mg daily.  - Continue spiro 12.5 mg daily. - Continue losartan 25 mg daily.  - Continue carvedilol 3.125 mg bid. - Continue Jardiance 10 mg daily. - Continue Paramedicine support  2. CAD:  - Stenting in 2010, 2012 and 2016.  - No s/s angina - Continue statin.  - No ASA with Eliquis.   3. Paroxysmal Afib - In NSR - Continue Eliquis 5 mg bid.  -  (  She is Jehovah's Witness).   4. HTN - Blood pressure well controlled. Continue current regimen.   5. DM - Per Endocrine.  - On insulin. - Continue Jardiance. Could increase jardiance if Endocrinology   recommends.  -No GU symptoms.   6. H/o sinus pauses s/p PPM - Dr. Elberta Fortis now following.  - No change.    7. OSA - Continue CPAP nightly.  - Follows with Dr. Mayford Knife.   8. CKD 3b - Baseline SCr ~ 1.5-2.0 - Last Scr 1.87 on 09/25/22 - Continue Jardiance.   9. 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

## 2022-11-13 ENCOUNTER — Other Ambulatory Visit (HOSPITAL_COMMUNITY): Payer: Self-pay

## 2022-11-13 MED ORDER — CARVEDILOL 3.125 MG PO TABS: 3.1250 mg | ORAL_TABLET | Freq: Two times a day (BID) | ORAL | 3 refills | Status: DC

## 2022-11-14 DIAGNOSIS — H608X3 Other otitis externa, bilateral: Secondary | ICD-10-CM | POA: Diagnosis not present

## 2022-11-14 DIAGNOSIS — H6123 Impacted cerumen, bilateral: Secondary | ICD-10-CM | POA: Diagnosis not present

## 2022-11-20 ENCOUNTER — Other Ambulatory Visit (HOSPITAL_COMMUNITY): Payer: Self-pay

## 2022-11-20 NOTE — Progress Notes (Signed)
Paramedicine Encounter    Patient ID: Danielle [name], female    DOB: June 01, 1945, 77 y.o.   MRN: [id number]   Complaints- toe pain   Assessment- Alert and oriented seated in her chair- no shortness of breath, no dizziness, no chest pain, no increased swelling more than normal. Right second toe pain. Color changes present- but has been seen by foot doctor for same. She reports she took Tylenol for pain and it helped some but not completely. Lungs clear.   Compliance with meds- no missed doses over last week   Pill box filled- for one week   Refills needed- none   Meds changes since last visit- none     Social changes- none   Arrived for home visit for [facility] who reports to be feeling good other than toe pain. She has been seen by podiatry for same. She reports having sensation there but reports an aching pain in it. Color is the same and no more swelling than normal noted in toe, foot and lower legs. Lungs clear. Vitals obtained as noted. I reviewed pills and filled pill box for one week. She reports she is going out of town next week and will need an extra pill box- I will come by Thursday to do same. No refills of RX's needed. We reviewed HF and DM management. I requested her get her OOP from pharmacy so we can proceed with Eliquis PAP. She agreed. Home visit complete. I will come out Thursday for med rec and Dexcom sensor replacement. She agreed.    BP 120/68   Pulse 77   Resp 16   Wt 208 lb (94.3 kg)   SpO2 97%   BMI 35.70 kg/m  Weight yesterday-- did not weigh  Last visit weight-- 206lbs    [name], EMT-Paramedic [phone]  ACTION: Home visit completed    Patient Care Team: Renford Dills, MD as PCP - General (Internal Medicine) Regan Lemming, MD as PCP - Electrophysiology (Cardiology) Quintella Reichert, MD as PCP - Sleep Medicine (Cardiology) Bensimhon, Bevelyn Buckles, MD as PCP - Cardiology (Cardiology) Burna Sis, LCSW as Social Worker (Licensed  Clinical Social Worker) Shamleffer, Konrad Dolores, MD as Consulting Physician (Endocrinology)  Patient Active Problem List   Diagnosis Date Noted   Chronic systolic heart failure (HCC) 05/08/2022   Type 2 diabetes mellitus with stage 3a chronic kidney disease, with long-term current use of insulin (HCC) 02/16/2022   Prosthetic joint implant failure, initial encounter (HCC) 12/20/2021   Pseudophakia of both eyes 11/15/2021   DVT (deep venous thrombosis) (HCC) 07/31/2021   Acute blood loss anemia    Septic Arthritis, Infection of prosthetic right knee joint (HCC), MSSA infection 07/10/2021   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) 07/10/2021   CAD (coronary artery disease) 06/08/2021   Carotid artery disease (HCC) 06/08/2021   Symptomatic bradycardia 06/08/2021   Paroxysmal atrial fibrillation (HCC) 06/08/2021   CKD (chronic kidney disease), stage III (HCC) 06/08/2021   Polyneuropathy associated with underlying disease (HCC) 05/04/2020   Severe nonproliferative diabetic retinopathy of left eye, with macular edema, associated with type 2 diabetes mellitus (HCC) 11/05/2019   Severe nonproliferative diabetic retinopathy of right eye, with macular edema, associated with type 2 diabetes mellitus (HCC) 11/05/2019   Retinal hemorrhage of right eye 11/05/2019   Retinal hemorrhage of left eye 11/05/2019   Diabetes mellitus (HCC) 05/14/2019   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, with long-term current use of insulin (HCC) 05/14/2019   Type 2  diabetes mellitus with diabetic polyneuropathy, with long-term current use of insulin (HCC) 05/14/2019   History of gout 05/20/2018   Primary localized osteoarthritis of right knee 05/13/2018   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) 12/20/2017   Obesity (BMI 30-39.9) 11/11/2017   OSA (obstructive sleep apnea) 02/18/2017   Essential hypertension    L Breast Cancer    Hypothyroidism     Current Outpatient  Medications:    acetaminophen (TYLENOL) 500 MG tablet, Take 500-1,000 mg by mouth every 6 (six) hours as needed (for pain or headaches)., Disp: , Rfl:    allopurinol (ZYLOPRIM) 100 MG tablet, Take 100 mg by mouth daily., Disp: , Rfl:    apixaban (ELIQUIS) 5 MG TABS tablet, Take 1 tablet (5 mg total) by mouth 2 (two) times daily., Disp: 60 tablet, Rfl: 11   aspirin EC 81 MG tablet, Take 1 tablet (81 mg total) by mouth daily. Swallow whole., Disp: 360 tablet, Rfl: 0   atorvastatin (LIPITOR) 80 MG tablet, Take 1 tablet (80 mg total) by mouth at bedtime., Disp: 90 tablet, Rfl: 3   B Complex-Biotin-FA (B-COMPLEX PO), Take by mouth., Disp: , Rfl:    Biotin 10 MG TABS, Take 10 mg by mouth daily., Disp: , Rfl:    Blood Glucose Monitoring Suppl (ONETOUCH VERIO) w/Device KIT, Use to check blood sugar, Disp: 1 kit, Rfl: 0   carvedilol (COREG) 3.125 MG tablet, Take 1 tablet (3.125 mg total) by mouth 2 (two) times daily with a meal., Disp: 180 tablet, Rfl: 3   clotrimazole-betamethasone (LOTRISONE) lotion, Apply 1 application topically 2 (two) times daily as needed (itching)., Disp: , Rfl:    Continuous Blood Gluc Sensor (DEXCOM G6 SENSOR) MISC, 1 Device by Does not apply route as directed., Disp: 9 each, Rfl: 3   empagliflozin (JARDIANCE) 10 MG TABS tablet, Take 1 tablet (10 mg total) by mouth daily., Disp: 30 tablet, Rfl: 11   ferrous sulfate 325 (65 FE) MG tablet, Take 325 mg by mouth 3 (three) times a week., Disp: , Rfl:    folic acid (FOLVITE) 1 MG tablet, Take 1 tablet (1 mg total) by mouth daily., Disp: 30 tablet, Rfl: 2   gabapentin (NEURONTIN) 300 MG capsule, Take 300 mg by mouth 2 (two) times daily., Disp: , Rfl:    glucose blood (ONETOUCH VERIO) test strip, Check blood sugar 2 times daily, Disp: 100 each, Rfl: 12   Insulin Glargine (BASAGLAR KWIKPEN) 100 UNIT/ML, Inject 40 Units into the skin at bedtime., Disp: 45 mL, Rfl: 2   Insulin Lispro (HUMALOG [place]), Inject 8 Units into the skin in the morning,  at noon, and at bedtime., Disp: , Rfl:    Insulin Pen Needle 32G X 4 MM MISC, 1 Device by Does not apply route in the morning, at noon, in the evening, and at bedtime., Disp: 400 each, Rfl: 3   Lancets (ONETOUCH DELICA PLUS LANCET33G) MISC, USE   TO CHECK GLUCOSE 4 TIMES DAILY, Disp: 100 each, Rfl: 3   latanoprost (XALATAN) 0.005 % ophthalmic solution, Place 1 drop into both eyes at bedtime., Disp: , Rfl:    levothyroxine (SYNTHROID, LEVOTHROID) 75 MCG tablet, Take 75 mcg by mouth daily before breakfast., Disp: , Rfl:    LUMIGAN 0.01 % SOLN, Place 1 drop into both eyes at bedtime., Disp: , Rfl:    Multiple Vitamins-Minerals (ALIVE ONCE DAILY WOMENS 50+ PO), Take 1 tablet by mouth daily., Disp: , Rfl:    spironolactone (ALDACTONE) 25 MG tablet, Take  0.5 tablets (12.5 mg total) by mouth daily., Disp: 45 tablet, Rfl: 3   torsemide (DEMADEX) 20 MG tablet, Take 2 tablets (40 mg total) by mouth daily., Disp: 90 tablet, Rfl: 3   Travoprost, BAK Free, (TRAVATAN) 0.004 % SOLN ophthalmic solution, Place 1 drop into both eyes at bedtime., Disp: , Rfl:  Allergies  Allergen Reactions   Codeine Other (See Comments)   Other Other (See Comments)    Blood Product Refusal (Jehovah's witness)     Tape Other (See Comments)    Leaves blisters and marks on the skin   Sulfa Antibiotics Rash   Uloric [Febuxostat] Rash     Social History   Socioeconomic History   Marital status: Widowed    Spouse name: Not on file   Number of children: 3   Years of education: Not on file   Highest education level: Some college, no degree  Occupational History   Occupation: retired  Tobacco Use   Smoking status: Former    Packs/day: 1.00    Years: 22.00    Additional pack years: 0.00    Total pack years: 22.00    Types: Cigarettes    Quit date: 46    Years since quitting: 41.5   Smokeless tobacco: Never  Vaping Use   Vaping Use: Never used  Substance and Sexual Activity   Alcohol use: Yes    Comment:  05/22/2018 "glass of wine couple times/wk"   Drug use: Not Currently   Sexual activity: Not Currently    Birth control/protection: None, Post-menopausal  Other Topics Concern   Not on file  Social History Narrative   Not on file   Social Determinants of Health   Financial Resource Strain: Medium Risk (12/22/2021)   Overall Financial Resource Strain (CARDIA)    Difficulty of Paying Living Expenses: Somewhat hard  Food Insecurity: No Food Insecurity (05/08/2018)   Hunger Vital Sign    Worried About Running Out of Food in the Last Year: Never true    Ran Out of Food in the Last Year: Never true  Transportation Needs: Unmet Transportation Needs (05/08/2022)   PRAPARE - Administrator, Civil Service (Medical): Yes    Lack of Transportation (Non-Medical): Yes  Physical Activity: Not on file  Stress: Not on file  Social Connections: Not on file  Intimate Partner Violence: Not on file    Physical Exam      Future Appointments  Date Time Provider Department Center  11/28/2022  2:00 PM [facility] DEVICE REMOTES [facility]  01/16/2023 10:00 AM [facility] VASC 6 - [facility]  01/16/2023 10:30 AM [facility] VASC 6 - [facility]  01/16/2023 11:20 AM Maeola Harman, MD [facility]  01/29/2023  2:30 PM [facility] PA/NP [facility] None  02/05/2023 11:15 AM Louann Sjogren, DPM [facility]  02/22/2023 11:30 AM Shamleffer, Konrad Dolores, MD [facility] None  02/27/2023  2:00 PM [facility] DEVICE REMOTES [facility]  05/30/2023  7:00 AM [facility] DEVICE REMOTES [facility]  08/28/2023  2:00 PM [facility] DEVICE REMOTES [facility]  11/27/2023  2:00 PM [facility] DEVICE REMOTES [facility]  02/26/2024  2:00 PM [facility] DEVICE REMOTES [facility]  05/27/2024  2:00 PM [facility] DEVICE REMOTES [facility]

## 2022-11-22 ENCOUNTER — Other Ambulatory Visit (HOSPITAL_COMMUNITY): Payer: Self-pay

## 2022-11-23 NOTE — Progress Notes (Signed)
Paramedicine Encounter    Patient ID: Danielle [name], female    DOB: 1946/04/29, 77 y.o.   MRN: [id number]   I arrived for a follow up medication review with Mrs. Mean as she is going out of town for a week and needed medications to last her during this time. I reviewed medications and filled pill box for two weeks. I also replaced her dexcom. I plan to see her in two weeks on her return. Home visit complete.    -CBG following dexcom placement is 108.   ACTION: Home visit completed

## 2022-12-01 DIAGNOSIS — E1165 Type 2 diabetes mellitus with hyperglycemia: Secondary | ICD-10-CM | POA: Diagnosis not present

## 2022-12-03 ENCOUNTER — Other Ambulatory Visit (HOSPITAL_COMMUNITY): Payer: Self-pay

## 2022-12-03 NOTE — Progress Notes (Signed)
Paramedicine Encounter    Patient ID: Danielle [name], female    DOB: 1945/10/22, 77 y.o.   MRN: [id number]   Complaints- toe pain with discoloration- has been normal for her over the past several months, has been evaluated by [facility] provider.   Assessment- Alert and oriented, ambulating around in her home with no shortness of breath, chest pain, dizziness or trouble ambulating. She reports feeling well over the last two weeks. Lungs clear. Third toe on left foot noted to be dark in color with some skin peeling off around the tip of the toe- expressed she should follow up with [facility]. She agreed.   Compliance with meds- a few missed doses of medications and insulin over the last two weeks.   Pill box filled- for one week   Refills needed- Jardiance, Eliquis- needs samples  Meds changes since last visit- none     Social changes- none    BP 122/78   Pulse 78   Resp 16   Wt 200 lb (90.7 kg)   SpO2 96%   BMI 34.33 kg/m  Weight yesterday-- didn't weigh Last visit weight-- 208lbs    Met with Berna in the home today- she has been out of town for the last two weeks visiting family in [place]. She reports she had a great time with no complaints other than her toe causing her some pain. I advised her to follow up with [facility] for same. She agreed and plans to call them. I left the number for her to do so. I obtained vitals as as noted. Assessment noted improved lower leg swelling- she got her compression socks and has been wearing them. Lungs clear. I reviewed meds and filled pill box for one week. I reviewed insulin doses with her and assisted with dose today after assessing her CBG at 317. 15 units of Humalog given. I placed new Dexcom sensor. We reviewed diabetic and HF management. Appointments reviewed and confirmed. Home visit complete. [name].    [name], EMT-Paramedic [phone]  ACTION: Home visit completed    Patient Care Team: Renford Dills, MD as PCP -  General (Internal Medicine) Regan Lemming, MD as PCP - Electrophysiology (Cardiology) Quintella Reichert, MD as PCP - Sleep Medicine (Cardiology) Bensimhon, Bevelyn Buckles, MD as PCP - Cardiology (Cardiology) Burna Sis, LCSW as Social Worker (Licensed Clinical Social Worker) Shamleffer, Konrad Dolores, MD as Consulting Physician (Endocrinology)  Patient Active Problem List   Diagnosis Date Noted   Chronic systolic heart failure (HCC) 05/08/2022   Type 2 diabetes mellitus with stage 3a chronic kidney disease, with long-term current use of insulin (HCC) 02/16/2022   Prosthetic joint implant failure, initial encounter (HCC) 12/20/2021   Pseudophakia of both eyes 11/15/2021   DVT (deep venous thrombosis) (HCC) 07/31/2021   Acute blood loss anemia    Septic Arthritis, Infection of prosthetic right knee joint (HCC), MSSA infection 07/10/2021   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) 07/10/2021   CAD (coronary artery disease) 06/08/2021   Carotid artery disease (HCC) 06/08/2021   Symptomatic bradycardia 06/08/2021   Paroxysmal atrial fibrillation (HCC) 06/08/2021   CKD (chronic kidney disease), stage III (HCC) 06/08/2021   Polyneuropathy associated with underlying disease (HCC) 05/04/2020   Severe nonproliferative diabetic retinopathy of left eye, with macular edema, associated with type 2 diabetes mellitus (HCC) 11/05/2019   Severe nonproliferative diabetic retinopathy of right eye, with macular edema, associated with type 2 diabetes mellitus (HCC) 11/05/2019  Retinal hemorrhage of right eye 11/05/2019   Retinal hemorrhage of left eye 11/05/2019   Diabetes mellitus (HCC) 05/14/2019   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, with long-term current use of insulin (HCC) 05/14/2019   Type 2 diabetes mellitus with diabetic polyneuropathy, with long-term current use of insulin (HCC) 05/14/2019   History of gout 05/20/2018   Primary localized osteoarthritis of right  knee 05/13/2018   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) 12/20/2017   Obesity (BMI 30-39.9) 11/11/2017   OSA (obstructive sleep apnea) 02/18/2017   Essential hypertension    L Breast Cancer    Hypothyroidism     Current Outpatient Medications:    acetaminophen (TYLENOL) 500 MG tablet, Take 500-1,000 mg by mouth every 6 (six) hours as needed (for pain or headaches)., Disp: , Rfl:    allopurinol (ZYLOPRIM) 100 MG tablet, Take 100 mg by mouth daily., Disp: , Rfl:    apixaban (ELIQUIS) 5 MG TABS tablet, Take 1 tablet (5 mg total) by mouth 2 (two) times daily., Disp: 60 tablet, Rfl: 11   aspirin EC 81 MG tablet, Take 1 tablet (81 mg total) by mouth daily. Swallow whole., Disp: 360 tablet, Rfl: 0   atorvastatin (LIPITOR) 80 MG tablet, Take 1 tablet (80 mg total) by mouth at bedtime., Disp: 90 tablet, Rfl: 3   B Complex-Biotin-FA (B-COMPLEX PO), Take by mouth., Disp: , Rfl:    Biotin 10 MG TABS, Take 10 mg by mouth daily., Disp: , Rfl:    Blood Glucose Monitoring Suppl (ONETOUCH VERIO) w/Device KIT, Use to check blood sugar, Disp: 1 kit, Rfl: 0   carvedilol (COREG) 3.125 MG tablet, Take 1 tablet (3.125 mg total) by mouth 2 (two) times daily with a meal., Disp: 180 tablet, Rfl: 3   Cinnamon 500 MG capsule, Take 500 mg by mouth daily., Disp: , Rfl:    clotrimazole-betamethasone (LOTRISONE) lotion, Apply 1 application topically 2 (two) times daily as needed (itching)., Disp: , Rfl:    Continuous Blood Gluc Sensor (DEXCOM G6 SENSOR) MISC, 1 Device by Does not apply route as directed., Disp: 9 each, Rfl: 3   empagliflozin (JARDIANCE) 10 MG TABS tablet, Take 1 tablet (10 mg total) by mouth daily., Disp: 30 tablet, Rfl: 11   ferrous sulfate 325 (65 FE) MG tablet, Take 325 mg by mouth 3 (three) times a week., Disp: , Rfl:    folic acid (FOLVITE) 1 MG tablet, Take 1 tablet (1 mg total) by mouth daily., Disp: 30 tablet, Rfl: 2   gabapentin (NEURONTIN) 300 MG capsule, Take 300 mg by mouth 2 (two) times  daily., Disp: , Rfl:    glucose blood (ONETOUCH VERIO) test strip, Check blood sugar 2 times daily, Disp: 100 each, Rfl: 12   Insulin Glargine (BASAGLAR KWIKPEN) 100 UNIT/ML, Inject 40 Units into the skin at bedtime., Disp: 45 mL, Rfl: 2   Insulin Lispro (HUMALOG [place]), Inject 8 Units into the skin in the morning, at noon, and at bedtime., Disp: , Rfl:    Insulin Pen Needle 32G X 4 MM MISC, 1 Device by Does not apply route in the morning, at noon, in the evening, and at bedtime., Disp: 400 each, Rfl: 3   Lancets (ONETOUCH DELICA PLUS LANCET33G) MISC, USE   TO CHECK GLUCOSE 4 TIMES DAILY, Disp: 100 each, Rfl: 3   latanoprost (XALATAN) 0.005 % ophthalmic solution, Place 1 drop into both eyes at bedtime., Disp: , Rfl:    levothyroxine (SYNTHROID, LEVOTHROID) 75 MCG tablet, Take  75 mcg by mouth daily before breakfast., Disp: , Rfl:    LUMIGAN 0.01 % SOLN, Place 1 drop into both eyes at bedtime., Disp: , Rfl:    Multiple Vitamins-Minerals (ALIVE ONCE DAILY WOMENS 50+ PO), Take 1 tablet by mouth daily., Disp: , Rfl:    spironolactone (ALDACTONE) 25 MG tablet, Take 0.5 tablets (12.5 mg total) by mouth daily., Disp: 45 tablet, Rfl: 3   torsemide (DEMADEX) 20 MG tablet, Take 2 tablets (40 mg total) by mouth daily., Disp: 90 tablet, Rfl: 3   Travoprost, BAK Free, (TRAVATAN) 0.004 % SOLN ophthalmic solution, Place 1 drop into both eyes at bedtime., Disp: , Rfl:  Allergies  Allergen Reactions   Codeine Other (See Comments)   Other Other (See Comments)    Blood Product Refusal (Jehovah's witness)     Tape Other (See Comments)    Leaves blisters and marks on the skin   Sulfa Antibiotics Rash   Uloric [Febuxostat] Rash     Social History   Socioeconomic History   Marital status: Widowed    Spouse name: Not on file   Number of children: 3   Years of education: Not on file   Highest education level: Some college, no degree  Occupational History   Occupation: retired  Tobacco Use   Smoking status:  Former    Packs/day: 1.00    Years: 22.00    Additional pack years: 0.00    Total pack years: 22.00    Types: Cigarettes    Quit date: 73    Years since quitting: 41.5   Smokeless tobacco: Never  Vaping Use   Vaping Use: Never used  Substance and Sexual Activity   Alcohol use: Yes    Comment: 05/22/2018 "glass of wine couple times/wk"   Drug use: Not Currently   Sexual activity: Not Currently    Birth control/protection: None, Post-menopausal  Other Topics Concern   Not on file  Social History Narrative   Not on file   Social Determinants of Health   Financial Resource Strain: Medium Risk (12/22/2021)   Overall Financial Resource Strain (CARDIA)    Difficulty of Paying Living Expenses: Somewhat hard  Food Insecurity: No Food Insecurity (05/08/2018)   Hunger Vital Sign    Worried About Running Out of Food in the Last Year: Never true    Ran Out of Food in the Last Year: Never true  Transportation Needs: Unmet Transportation Needs (05/08/2022)   PRAPARE - Administrator, Civil Service (Medical): Yes    Lack of Transportation (Non-Medical): Yes  Physical Activity: Not on file  Stress: Not on file  Social Connections: Not on file  Intimate Partner Violence: Not on file    Physical Exam      Future Appointments  Date Time Provider Department Center  01/16/2023 10:00 AM [facility] - [facility]  01/16/2023 10:30 AM [facility]  01/16/2023 11:20 AM Maeola Harman, MD [facility]  01/29/2023  2:30 PM [facility] PA/NP [facility] None  02/05/2023 11:15 AM Louann Sjogren, DPM [facility]  02/22/2023 11:30 AM Shamleffer, Konrad Dolores, MD [facility] None  02/27/2023  2:00 PM [facility] DEVICE REMOTES [facility]  05/30/2023  7:00 AM [facility] DEVICE REMOTES [facility]  08/28/2023  2:00 PM [facility] DEVICE REMOTES [facility]  11/27/2023  2:00 PM [facility] DEVICE REMOTES  [facility]  02/26/2024  2:00 PM [facility] DEVICE REMOTES [facility]  05/27/2024  2:00 PM  [facility] DEVICE REMOTES [facility]

## 2022-12-06 ENCOUNTER — Other Ambulatory Visit (HOSPITAL_COMMUNITY): Payer: Self-pay

## 2022-12-06 NOTE — Progress Notes (Signed)
Paramedicine Encounter    Patient ID: Danielle [name], female    DOB: [birth year], 77 y.o.   MRN: [id number]   Met with Yolander in the home today to fill pill box for remainder of the week to place [place] back on our normal Thursday weekly rotation. Will follow up in the home next Thursday.    Refills: Torsemide, ASA.      [name], EMT-Paramedic [phone] 12/06/2022   ACTION: Home visit completed

## 2022-12-07 ENCOUNTER — Telehealth (HOSPITAL_COMMUNITY): Payer: Self-pay

## 2022-12-07 ENCOUNTER — Ambulatory Visit: Payer: Medicare HMO

## 2022-12-07 DIAGNOSIS — I495 Sick sinus syndrome: Secondary | ICD-10-CM

## 2022-12-07 LAB — CUP PACEART REMOTE DEVICE CHECK
Battery Remaining Longevity: 39 mo
Battery Voltage: 2.95 V
Brady Statistic AP VP Percent: 0 %
Brady Statistic AP VS Percent: 1.01 %
Brady Statistic AS VP Percent: 0.06 %
Brady Statistic AS VS Percent: 98.92 %
Brady Statistic RA Percent Paced: 1.02 %
Brady Statistic RV Percent Paced: 0.06 %
Date Time Interrogation Session: 20240712020607
Implantable Lead Connection Status: 753985
Implantable Lead Connection Status: 753985
Implantable Lead Implant Date: 20160222
Implantable Lead Implant Date: 20160222
Implantable Lead Location: 753859
Implantable Lead Location: 753860
Implantable Lead Model: 5076
Implantable Lead Model: 5076
Implantable Pulse Generator Implant Date: 20160222
Lead Channel Impedance Value: 418 Ohm
Lead Channel Impedance Value: 418 Ohm
Lead Channel Impedance Value: 437 Ohm
Lead Channel Impedance Value: 494 Ohm
Lead Channel Pacing Threshold Amplitude: 0.75 V
Lead Channel Pacing Threshold Amplitude: 0.75 V
Lead Channel Pacing Threshold Pulse Width: 0.4 ms
Lead Channel Pacing Threshold Pulse Width: 0.4 ms
Lead Channel Sensing Intrinsic Amplitude: 17.625 mV
Lead Channel Sensing Intrinsic Amplitude: 17.625 mV
Lead Channel Sensing Intrinsic Amplitude: 4.125 mV
Lead Channel Sensing Intrinsic Amplitude: 4.125 mV
Lead Channel Setting Pacing Amplitude: 2 V
Lead Channel Setting Pacing Amplitude: 2 V
Lead Channel Setting Pacing Pulse Width: 0.4 ms
Lead Channel Setting Sensing Sensitivity: 2 mV
Zone Setting Status: 755011
Zone Setting Status: 755011

## 2022-12-07 NOTE — Telephone Encounter (Signed)
Advanced Heart Failure Patient Advocate Encounter  Application for Eliquis faxed to [facility] on 12/06/2022. Application form attached to patient chart.  Burnell Blanks, CPhT Rx Patient Advocate Phone: [phone]

## 2022-12-10 NOTE — Telephone Encounter (Signed)
Advanced Heart Failure Patient Advocate Encounter  Patient was approved to receive Eliquis from [facility] Effective 12/07/2022 to 05/28/2023.  Determination letter has been added to patient chart. Informed pt by phone.

## 2022-12-11 ENCOUNTER — Encounter: Payer: Self-pay | Admitting: Podiatry

## 2022-12-11 ENCOUNTER — Ambulatory Visit (INDEPENDENT_AMBULATORY_CARE_PROVIDER_SITE_OTHER): Payer: Medicare HMO

## 2022-12-11 ENCOUNTER — Ambulatory Visit: Payer: Medicare HMO | Admitting: Podiatry

## 2022-12-11 DIAGNOSIS — L97524 Non-pressure chronic ulcer of other part of left foot with necrosis of bone: Secondary | ICD-10-CM

## 2022-12-11 DIAGNOSIS — E1151 Type 2 diabetes mellitus with diabetic peripheral angiopathy without gangrene: Secondary | ICD-10-CM

## 2022-12-11 MED ORDER — DOXYCYCLINE HYCLATE 100 MG PO TABS: 100.0000 mg | ORAL_TABLET | Freq: Two times a day (BID) | ORAL | 0 refills | Status: AC

## 2022-12-11 NOTE — Progress Notes (Signed)
Subjective:  Patient ID: Danielle [name], female    DOB: [birth year],   MRN: [id number]  Chief Complaint  Patient presents with   Diabetes   Diabetic Ulcer    left 2nd toe - hole on the top - draining - diabetic, toe now has an odor    77 y.o. female presents for follow-up of left second toe wound. .Relates the last week the toe has been more swollen painful and now has an odor. Has not been dressing currently.  Denies any other pedal complaints. Denies n/v/f/c.   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HCC) 02/05/2017   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 05/20/2018   Acute kidney failure (HCC) 05/15/2018   Arthritis    "all over" (05/22/2018)   Blood dyscrasia    per pt-has small blood cells-appears as if anemic   Breast cancer, left breast (HCC) 1985   CHF (congestive heart failure) (HCC)    Complication of anesthesia    difficult to awaken from per pt   Coronary artery disease    Dyspnea    Essential hypertension    Heart disease    Heart murmur    History of gout    Hypothyroidism    Obesity (BMI 30-39.9) 11/11/2017   OSA (obstructive sleep apnea) 02/18/2017    severe obstructive sleep apnea with an AHI of 75.6/h and mild central sleep apnea with a CAI of 7.7/h.  Oxygen saturations dropped as low as 82%.   He is on CPAP at 9 cm H2O.   OSA on CPAP    Personal history of chemotherapy    Personal history of radiation therapy    Presence of permanent cardiac pacemaker    Primary localized osteoarthritis of right knee 05/13/2018   Refusal of blood transfusions as patient is Jehovah's Witness    Type II diabetes mellitus (HCC)     Objective:  Physical Exam: Vascular: DP/PT pulses 2/4 bilateral. CFT <3 seconds. Absent hair growth on digits. Edema noted to bilateral lower extremities. Xerosis noted bilaterally.  Skin. No lacerations or abrasions bilateral feet. Nails 1-5 bilateral  are  thickened discolored and  with subungual debris. Left second digit ulceration reopened with more swelling and drainage noted in the toe. Does probe deep to bone. No purulence noted. Wound about 1 cm x 1 cm x 0.5 cm  Musculoskeletal: MMT 5/5 bilateral lower extremities in DF, PF, Inversion and Eversion. Deceased ROM in DF of ankle joint.  Neurological: Sensation intact to light touch. Protective sensation diminished bilateral.   MRI left foot:  IMPRESSION: 1. Ulceration at the tip of the second toe with underlying osteomyelitis of the second distal phalanx.  Assessment:   1. 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 Type II diabetes mellitus with peripheral circulatory disorder [facility])             Plan:  Patient was evaluated and treated and all questions answered. Ulcer Left second digit with necorsis of bone -X-rays with erosions likely noted to distal left second digit phalanx.  -Debridement as below. -Dressed with betadine, DSD. -Off-loading with surgical shoe.Additional dispensed as previous was lost.  -Doxycycline sent to pharmacy  -Discussed glucose control and proper protein-rich diet.  Vascular procedure preformed on 3/4  MRI showing osteomyelitis of the distal tip of the phalanx of the second digit. Discussed these results with patient. Discussed concern that  osteomyelitis is again acute osteomyelitis. Discussed her best option is amputation of the toe. Patient refuses amputation. She would like to try long term antibiotics again -Discussed if any worsening redness, pain, fever or chills to call or may need to report to the emergency room. Patient expressed understanding.   Procedure: Excisional Debridement of Wound Rationale: Removal of non-viable soft tissue from the wound to promote healing.  Anesthesia: none Pre-Debridement Wound Measurements: Ovelrying slough and hyperkeratosis  Post-Debridement Wound Measurements: 1 cm x 1 cm x 0.5 cm   Type of Debridement: Sharp Excisional Tissue Removed: Non-viable soft tissue Depth of Debridement: subcutaneous tissue. Technique: Sharp excisional debridement to bleeding, viable wound base.  Dressing: Dry, sterile, compression dressing. Disposition: Patient tolerated procedure well. Patient to return in 2 week for follow-up.  No follow-ups on file.    Return in 3 months for [facility].   No follow-ups on file.    No follow-ups on file.   Louann Sjogren, DPM

## 2022-12-12 DIAGNOSIS — B370 Candidal stomatitis: Secondary | ICD-10-CM | POA: Diagnosis not present

## 2022-12-12 DIAGNOSIS — J069 Acute upper respiratory infection, unspecified: Secondary | ICD-10-CM | POA: Diagnosis not present

## 2022-12-12 DIAGNOSIS — Z03818 Encounter for observation for suspected exposure to other biological agents ruled out: Secondary | ICD-10-CM | POA: Diagnosis not present

## 2022-12-12 DIAGNOSIS — M869 Osteomyelitis, unspecified: Secondary | ICD-10-CM | POA: Diagnosis not present

## 2022-12-13 ENCOUNTER — Other Ambulatory Visit (HOSPITAL_COMMUNITY): Payer: Self-pay

## 2022-12-13 NOTE — Progress Notes (Signed)
Paramedicine Encounter    Patient ID: Danielle [name], female    DOB: Nov 28, 1945, 77 y.o.   MRN: [id number]   Complaints- toe pain, sinus infection with sinus headache   Assessment- CAOx4, warm and dry, reporting to be having sinus headache with congestion and toe pain. She was tested yesterday for covid, flu, strep. All were negative. She is taking over the counter sinus medications (Tylenol Sinus, Corcidin Cough) Lungs clear, lower leg edema present, CBG 435.   Compliance with meds- missed insulin several times   Pill box filled- for one week   Refills needed- none  Meds changes since last visit- added doxycycline 14 days     Social changes- none   Arrived for home visit for [facility] who reports to be having some toe pain she was seen by [facility] this week for same and given an antibiotic. She also reports having a sinus infection and was tested for flu, covid, strep at PCP yesterday. She states that she is using some OTC meds and hopes it will improve soon- main issue is sinus headache. I recommended heart safe meds. I reviewed meds and filled pill box for one week. I also obtained vitals and assessment. Lower legs noted to have some edema. Her weight is up 8lbs from last week- she reports no shortness of breath, lungs clear. She reports having some constipation with no BM in a few days. She also reports eating several high sodium, high sugar items over the last week. I explained to her the importance of avoiding these things to keep fluid off and her heart healthy as well as maintaining her diabetes. She verbalized understanding. I reviewed upcoming appointments, replaced her dexcom sensor and wrote down her updated insulin doses. Home visit complete. I will plan to see Caelyn in one week and stressed the importance of adhering to her diet and medication regimens.   BP 122/62   Pulse 78   Resp 16   Wt 208 lb (94.3 kg)   SpO2 94%   BMI 35.70 kg/m  Weight yesterday-- 207lbs  Last visit  weight-- 200lbs     [name], EMT-Paramedic [phone]  ACTION: Home visit completed     Patient Care Team: Renford Dills, MD as PCP - General (Internal Medicine) Regan Lemming, MD as PCP - Electrophysiology (Cardiology) Quintella Reichert, MD as PCP - Sleep Medicine (Cardiology) Bensimhon, Bevelyn Buckles, MD as PCP - Cardiology (Cardiology) Burna Sis, LCSW as Social Worker (Licensed Clinical Social Worker) Shamleffer, Konrad Dolores, MD as Consulting Physician (Endocrinology)  Patient Active Problem List   Diagnosis Date Noted   Chronic systolic heart failure (HCC) 05/08/2022   Type 2 diabetes mellitus with stage 3a chronic kidney disease, with long-term current use of insulin (HCC) 02/16/2022   Prosthetic joint implant failure, initial encounter (HCC) 12/20/2021   Pseudophakia of both eyes 11/15/2021   DVT (deep venous thrombosis) (HCC) 07/31/2021   Acute blood loss anemia    Septic Arthritis, Infection of prosthetic right knee joint (HCC), MSSA infection 07/10/2021   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) 07/10/2021   CAD (coronary artery disease) 06/08/2021   Carotid artery disease (HCC) 06/08/2021   Symptomatic bradycardia 06/08/2021   Paroxysmal atrial fibrillation (HCC) 06/08/2021   CKD (chronic kidney disease), stage III (HCC) 06/08/2021   Polyneuropathy associated with underlying disease (HCC) 05/04/2020   Severe nonproliferative diabetic retinopathy of left eye, with macular edema, associated with type 2 diabetes mellitus (HCC) 11/05/2019   Severe nonproliferative diabetic retinopathy  of right eye, with macular edema, associated with type 2 diabetes mellitus (HCC) 11/05/2019   Retinal hemorrhage of right eye 11/05/2019   Retinal hemorrhage of left eye 11/05/2019   Diabetes mellitus (HCC) 05/14/2019   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, with long-term current use of insulin (HCC) 05/14/2019   Type 2 diabetes mellitus  with diabetic polyneuropathy, with long-term current use of insulin (HCC) 05/14/2019   History of gout 05/20/2018   Primary localized osteoarthritis of right knee 05/13/2018   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) 12/20/2017   Obesity (BMI 30-39.9) 11/11/2017   OSA (obstructive sleep apnea) 02/18/2017   Essential hypertension    L Breast Cancer    Hypothyroidism     Current Outpatient Medications:    acetaminophen (TYLENOL) 500 MG tablet, Take 500-1,000 mg by mouth every 6 (six) hours as needed (for pain or headaches)., Disp: , Rfl:    allopurinol (ZYLOPRIM) 100 MG tablet, Take 100 mg by mouth daily., Disp: , Rfl:    apixaban (ELIQUIS) 5 MG TABS tablet, Take 1 tablet (5 mg total) by mouth 2 (two) times daily., Disp: 60 tablet, Rfl: 11   aspirin EC 81 MG tablet, Take 1 tablet (81 mg total) by mouth daily. Swallow whole., Disp: 360 tablet, Rfl: 0   atorvastatin (LIPITOR) 80 MG tablet, Take 1 tablet (80 mg total) by mouth at bedtime., Disp: 90 tablet, Rfl: 3   B Complex-Biotin-FA (B-COMPLEX PO), Take by mouth., Disp: , Rfl:    Biotin 10 MG TABS, Take 10 mg by mouth daily., Disp: , Rfl:    Blood Glucose Monitoring Suppl (ONETOUCH VERIO) w/Device KIT, Use to check blood sugar, Disp: 1 kit, Rfl: 0   carvedilol (COREG) 3.125 MG tablet, Take 1 tablet (3.125 mg total) by mouth 2 (two) times daily with a meal., Disp: 180 tablet, Rfl: 3   Cinnamon 500 MG capsule, Take 500 mg by mouth daily., Disp: , Rfl:    clotrimazole-betamethasone (LOTRISONE) lotion, Apply 1 application topically 2 (two) times daily as needed (itching)., Disp: , Rfl:    Continuous Blood Gluc Sensor (DEXCOM G6 SENSOR) MISC, 1 Device by Does not apply route as directed., Disp: 9 each, Rfl: 3   doxycycline (VIBRA-TABS) 100 MG tablet, Take 1 tablet (100 mg total) by mouth 2 (two) times daily for 14 days., Disp: 28 tablet, Rfl: 0   empagliflozin (JARDIANCE) 10 MG TABS tablet, Take 1 tablet (10 mg total) by mouth daily., Disp: 30  tablet, Rfl: 11   ferrous sulfate 325 (65 FE) MG tablet, Take 325 mg by mouth 3 (three) times a week., Disp: , Rfl:    folic acid (FOLVITE) 1 MG tablet, Take 1 tablet (1 mg total) by mouth daily., Disp: 30 tablet, Rfl: 2   gabapentin (NEURONTIN) 300 MG capsule, Take 300 mg by mouth 2 (two) times daily., Disp: , Rfl:    glucose blood (ONETOUCH VERIO) test strip, Check blood sugar 2 times daily, Disp: 100 each, Rfl: 12   Insulin Glargine (BASAGLAR KWIKPEN) 100 UNIT/ML, Inject 40 Units into the skin at bedtime., Disp: 45 mL, Rfl: 2   Insulin Lispro (HUMALOG [place]), Inject 8 Units into the skin in the morning, at noon, and at bedtime., Disp: , Rfl:    Insulin Pen Needle 32G X 4 MM MISC, 1 Device by Does not apply route in the morning, at noon, in the evening, and at bedtime., Disp: 400 each, Rfl: 3   Lancets (ONETOUCH DELICA PLUS LANCET33G) MISC,  USE   TO CHECK GLUCOSE 4 TIMES DAILY, Disp: 100 each, Rfl: 3   latanoprost (XALATAN) 0.005 % ophthalmic solution, Place 1 drop into both eyes at bedtime., Disp: , Rfl:    levothyroxine (SYNTHROID, LEVOTHROID) 75 MCG tablet, Take 75 mcg by mouth daily before breakfast., Disp: , Rfl:    LUMIGAN 0.01 % SOLN, Place 1 drop into both eyes at bedtime., Disp: , Rfl:    Multiple Vitamins-Minerals (ALIVE ONCE DAILY WOMENS 50+ PO), Take 1 tablet by mouth daily., Disp: , Rfl:    spironolactone (ALDACTONE) 25 MG tablet, Take 0.5 tablets (12.5 mg total) by mouth daily., Disp: 45 tablet, Rfl: 3   torsemide (DEMADEX) 20 MG tablet, Take 2 tablets (40 mg total) by mouth daily., Disp: 90 tablet, Rfl: 3   Travoprost, BAK Free, (TRAVATAN) 0.004 % SOLN ophthalmic solution, Place 1 drop into both eyes at bedtime., Disp: , Rfl:  Allergies  Allergen Reactions   Codeine Other (See Comments)   Other Other (See Comments)    Blood Product Refusal (Jehovah's witness)     Tape Other (See Comments)    Leaves blisters and marks on the skin   Sulfa Antibiotics Rash   Uloric [Febuxostat]  Rash     Social History   Socioeconomic History   Marital status: Widowed    Spouse name: Not on file   Number of children: 3   Years of education: Not on file   Highest education level: Some college, no degree  Occupational History   Occupation: retired  Tobacco Use   Smoking status: Former    Current packs/day: 0.00    Average packs/day: 1 pack/day for 22.0 years (22.0 ttl pk-yrs)    Types: Cigarettes    Start date: 28    Quit date: 13    Years since quitting: 41.5   Smokeless tobacco: Never  Vaping Use   Vaping status: Never Used  Substance and Sexual Activity   Alcohol use: Yes    Comment: 05/22/2018 "glass of wine couple times/wk"   Drug use: Not Currently   Sexual activity: Not Currently    Birth control/protection: None, Post-menopausal  Other Topics Concern   Not on file  Social History Narrative   Not on file   Social Determinants of Health   Financial Resource Strain: Medium Risk (12/22/2021)   Overall Financial Resource Strain (CARDIA)    Difficulty of Paying Living Expenses: Somewhat hard  Food Insecurity: No Food Insecurity (05/08/2018)   Hunger Vital Sign    Worried About Running Out of Food in the Last Year: Never true    Ran Out of Food in the Last Year: Never true  Transportation Needs: Unmet Transportation Needs (05/08/2022)   PRAPARE - Administrator, Civil Service (Medical): Yes    Lack of Transportation (Non-Medical): Yes  Physical Activity: Not on file  Stress: Not on file  Social Connections: Not on file  Intimate Partner Violence: Not on file    Physical Exam      Future Appointments  Date Time Provider Department Center  12/26/2022 10:15 AM Louann Sjogren, DPM [facility]  01/16/2023 10:00 AM [facility]  01/16/2023 10:30 AM [facility]  01/16/2023 11:20 AM Maeola Harman, MD [facility]  01/29/2023  2:30 PM [facility] PA/NP [facility] None  02/05/2023 11:15 AM Louann Sjogren, DPM [facility]  02/22/2023 11:30 AM Shamleffer, Konrad Dolores, MD [facility] None  03/08/2023  7:05 AM [facility]  DEVICE REMOTES [facility]  06/07/2023  7:05 AM [facility] DEVICE REMOTES [facility]  09/06/2023  7:05 AM [facility] DEVICE REMOTES [facility]  12/06/2023  7:05 AM [facility] DEVICE REMOTES [facility]  03/06/2024  7:05 AM [facility] DEVICE REMOTES [facility]  06/05/2024  7:05 AM [facility] DEVICE REMOTES [facility]  09/04/2024  7:05 AM [facility] DEVICE REMOTES [facility]  12/04/2024  7:05 AM [facility] DEVICE REMOTES [facility]  03/05/2025  7:05 AM [facility] DEVICE REMOTES [facility]  06/04/2025  7:05 AM [facility] DEVICE REMOTES [facility]  09/03/2025  7:05 AM [facility] DEVICE REMOTES [facility]  12/03/2025  7:05 AM [facility] DEVICE REMOTES [facility]

## 2022-12-20 ENCOUNTER — Other Ambulatory Visit (HOSPITAL_COMMUNITY): Payer: Self-pay | Admitting: Emergency Medicine

## 2022-12-20 NOTE — Progress Notes (Signed)
Paramedicine Encounter    Patient ID: Danielle [name], female    DOB: 05/01/1946, 77 y.o.   MRN: [id number]   Complaints- Toe Pain  Assessment- CAOX4, warm and dry, no shortness of breath, no chest pain, some lightheadedness (she feels it's related to her sugar), she reports no swelling, no weight gain.   Compliance with meds-  missed two night time doses- accidentally took todays morning dose last night.   Pill box filled- one week   Refills needed- none   Meds changes since last visit- none     Social changes- none    VISIT SUMMARY-  Arrived for home visit for [facility] who reports to be feeling much better this week compared to last week when she was having some sinus congestion and head pressure. Her dexcom fell off yesterday- we will replace today. She denied any chest pain, shortness of breath, swelling. Vitals obtained. Assessment obtained. Meds reviewed and pill box filled for one week. Will complete the doxycycline for her toe on Tuesday July 30th. We reviewed HF management and discussed diabetic management.  She agreed with same. Home visit complete. [name].   Dexcom placed.    There were no vitals taken for this visit. Weight yesterday-- 202lbs  Last visit weight-- 208lbs   CBG- 259   ACTION: Home visit completed     Patient Care Team: Renford Dills, MD as PCP - General (Internal Medicine) Regan Lemming, MD as PCP - Electrophysiology (Cardiology) Quintella Reichert, MD as PCP - Sleep Medicine (Cardiology) Bensimhon, Bevelyn Buckles, MD as PCP - Cardiology (Cardiology) Burna Sis, LCSW as Social Worker (Licensed Clinical Social Worker) Shamleffer, Konrad Dolores, MD as Consulting Physician (Endocrinology)  Patient Active Problem List   Diagnosis Date Noted   Chronic systolic heart failure (HCC) 05/08/2022   Type 2 diabetes mellitus with stage 3a chronic kidney disease, with long-term current use of insulin (HCC) 02/16/2022    Prosthetic joint implant failure, initial encounter (HCC) 12/20/2021   Pseudophakia of both eyes 11/15/2021   DVT (deep venous thrombosis) (HCC) 07/31/2021   Acute blood loss anemia    Septic Arthritis, Infection of prosthetic right knee joint (HCC), MSSA infection 07/10/2021   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) 07/10/2021   CAD (coronary artery disease) 06/08/2021   Carotid artery disease (HCC) 06/08/2021   Symptomatic bradycardia 06/08/2021   Paroxysmal atrial fibrillation (HCC) 06/08/2021   CKD (chronic kidney disease), stage III (HCC) 06/08/2021   Polyneuropathy associated with underlying disease (HCC) 05/04/2020   Severe nonproliferative diabetic retinopathy of left eye, with macular edema, associated with type 2 diabetes mellitus (HCC) 11/05/2019   Severe nonproliferative diabetic retinopathy of right eye, with macular edema, associated with type 2 diabetes mellitus (HCC) 11/05/2019   Retinal hemorrhage of right eye 11/05/2019   Retinal hemorrhage of left eye 11/05/2019   Diabetes mellitus (HCC) 05/14/2019   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, with long-term current use of insulin (HCC) 05/14/2019   Type 2 diabetes mellitus with diabetic polyneuropathy, with long-term current use of insulin (HCC) 05/14/2019   History of gout 05/20/2018   Primary localized osteoarthritis of right knee 05/13/2018   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) 12/20/2017   Obesity (BMI 30-39.9) 11/11/2017   OSA (obstructive sleep apnea) 02/18/2017   Essential hypertension    L Breast Cancer    Hypothyroidism     Current Outpatient Medications:    acetaminophen (TYLENOL) 500 MG tablet, Take  500-1,000 mg by mouth every 6 (six) hours as needed (for pain or headaches)., Disp: , Rfl:    allopurinol (ZYLOPRIM) 100 MG tablet, Take 100 mg by mouth daily., Disp: , Rfl:    apixaban (ELIQUIS) 5 MG TABS tablet, Take 1 tablet (5 mg total) by mouth 2 (two) times daily.,  Disp: 60 tablet, Rfl: 11   aspirin EC 81 MG tablet, Take 1 tablet (81 mg total) by mouth daily. Swallow whole., Disp: 360 tablet, Rfl: 0   atorvastatin (LIPITOR) 80 MG tablet, Take 1 tablet (80 mg total) by mouth at bedtime., Disp: 90 tablet, Rfl: 3   B Complex-Biotin-FA (B-COMPLEX PO), Take by mouth., Disp: , Rfl:    Biotin 10 MG TABS, Take 10 mg by mouth daily., Disp: , Rfl:    Blood Glucose Monitoring Suppl (ONETOUCH VERIO) w/Device KIT, Use to check blood sugar, Disp: 1 kit, Rfl: 0   carvedilol (COREG) 3.125 MG tablet, Take 1 tablet (3.125 mg total) by mouth 2 (two) times daily with a meal., Disp: 180 tablet, Rfl: 3   Cinnamon 500 MG capsule, Take 500 mg by mouth daily., Disp: , Rfl:    clotrimazole-betamethasone (LOTRISONE) lotion, Apply 1 application topically 2 (two) times daily as needed (itching)., Disp: , Rfl:    Continuous Blood Gluc Sensor (DEXCOM G6 SENSOR) MISC, 1 Device by Does not apply route as directed., Disp: 9 each, Rfl: 3   doxycycline (VIBRA-TABS) 100 MG tablet, Take 1 tablet (100 mg total) by mouth 2 (two) times daily for 14 days., Disp: 28 tablet, Rfl: 0   empagliflozin (JARDIANCE) 10 MG TABS tablet, Take 1 tablet (10 mg total) by mouth daily., Disp: 30 tablet, Rfl: 11   ferrous sulfate 325 (65 FE) MG tablet, Take 325 mg by mouth 3 (three) times a week., Disp: , Rfl:    folic acid (FOLVITE) 1 MG tablet, Take 1 tablet (1 mg total) by mouth daily., Disp: 30 tablet, Rfl: 2   gabapentin (NEURONTIN) 300 MG capsule, Take 300 mg by mouth 2 (two) times daily., Disp: , Rfl:    glucose blood (ONETOUCH VERIO) test strip, Check blood sugar 2 times daily, Disp: 100 each, Rfl: 12   Insulin Glargine (BASAGLAR KWIKPEN) 100 UNIT/ML, Inject 40 Units into the skin at bedtime., Disp: 45 mL, Rfl: 2   Insulin Lispro (HUMALOG [place]), Inject 8 Units into the skin in the morning, at noon, and at bedtime., Disp: , Rfl:    Insulin Pen Needle 32G X 4 MM MISC, 1 Device by Does not apply route in the  morning, at noon, in the evening, and at bedtime., Disp: 400 each, Rfl: 3   Lancets (ONETOUCH DELICA PLUS LANCET33G) MISC, USE   TO CHECK GLUCOSE 4 TIMES DAILY, Disp: 100 each, Rfl: 3   latanoprost (XALATAN) 0.005 % ophthalmic solution, Place 1 drop into both eyes at bedtime., Disp: , Rfl:    levothyroxine (SYNTHROID, LEVOTHROID) 75 MCG tablet, Take 75 mcg by mouth daily before breakfast., Disp: , Rfl:    LUMIGAN 0.01 % SOLN, Place 1 drop into both eyes at bedtime., Disp: , Rfl:    Multiple Vitamins-Minerals (ALIVE ONCE DAILY WOMENS 50+ PO), Take 1 tablet by mouth daily., Disp: , Rfl:    spironolactone (ALDACTONE) 25 MG tablet, Take 0.5 tablets (12.5 mg total) by mouth daily., Disp: 45 tablet, Rfl: 3   torsemide (DEMADEX) 20 MG tablet, Take 2 tablets (40 mg total) by mouth daily., Disp: 90 tablet, Rfl: 3   Travoprost, BAK  Free, (TRAVATAN) 0.004 % SOLN ophthalmic solution, Place 1 drop into both eyes at bedtime., Disp: , Rfl:  Allergies  Allergen Reactions   Codeine Other (See Comments)   Other Other (See Comments)    Blood Product Refusal (Jehovah's witness)     Tape Other (See Comments)    Leaves blisters and marks on the skin   Sulfa Antibiotics Rash   Uloric [Febuxostat] Rash     Social History   Socioeconomic History   Marital status: Widowed    Spouse name: Not on file   Number of children: 3   Years of education: Not on file   Highest education level: Some college, no degree  Occupational History   Occupation: retired  Tobacco Use   Smoking status: Former    Current packs/day: 0.00    Average packs/day: 1 pack/day for 22.0 years (22.0 ttl pk-yrs)    Types: Cigarettes    Start date: 71    Quit date: 20    Years since quitting: 41.5   Smokeless tobacco: Never  Vaping Use   Vaping status: Never Used  Substance and Sexual Activity   Alcohol use: Yes    Comment: 05/22/2018 "glass of wine couple times/wk"   Drug use: Not Currently   Sexual activity: Not Currently     Birth control/protection: None, Post-menopausal  Other Topics Concern   Not on file  Social History Narrative   Not on file   Social Determinants of Health   Financial Resource Strain: Medium Risk (12/22/2021)   Overall Financial Resource Strain (CARDIA)    Difficulty of Paying Living Expenses: Somewhat hard  Food Insecurity: No Food Insecurity (05/08/2018)   Hunger Vital Sign    Worried About Running Out of Food in the Last Year: Never true    Ran Out of Food in the Last Year: Never true  Transportation Needs: Unmet Transportation Needs (05/08/2022)   PRAPARE - Administrator, Civil Service (Medical): Yes    Lack of Transportation (Non-Medical): Yes  Physical Activity: Not on file  Stress: Not on file  Social Connections: Not on file  Intimate Partner Violence: Not on file    Physical Exam      Future Appointments  Date Time Provider Department Center  12/26/2022 10:15 AM Louann Sjogren, DPM [facility]  01/16/2023 10:00 AM [facility]  01/16/2023 10:30 AM [facility]  01/16/2023 11:20 AM Maeola Harman, MD [facility]  01/29/2023  2:30 PM [facility] PA/NP [facility] None  02/05/2023 11:15 AM Louann Sjogren, DPM [facility]  02/22/2023 11:30 AM Shamleffer, Konrad Dolores, MD [facility] None  03/08/2023  7:05 AM [facility] DEVICE REMOTES [facility]  06/07/2023  7:05 AM [facility] DEVICE REMOTES [facility]  09/06/2023  7:05 AM [facility] DEVICE REMOTES [facility]  12/06/2023  7:05 AM [facility] DEVICE REMOTES [facility]  03/06/2024  7:05 AM [facility] DEVICE REMOTES [facility]  06/05/2024  7:05 AM [facility] DEVICE REMOTES [facility]  09/04/2024  7:05 AM [facility] DEVICE REMOTES [facility]  12/04/2024  7:05 AM [facility] DEVICE REMOTES [facility]  03/05/2025  7:05 AM [facility] DEVICE REMOTES  [facility]  06/04/2025  7:05 AM [facility] DEVICE REMOTES [facility]  09/03/2025  7:05 AM [facility] DEVICE REMOTES [facility]  12/03/2025  7:05 AM [facility] DEVICE REMOTES [facility]

## 2022-12-21 NOTE — Progress Notes (Signed)
Remote pacemaker transmission.   

## 2022-12-26 ENCOUNTER — Other Ambulatory Visit: Payer: Self-pay | Admitting: Podiatry

## 2022-12-26 ENCOUNTER — Ambulatory Visit: Payer: Medicare HMO | Admitting: Podiatry

## 2022-12-26 ENCOUNTER — Encounter: Payer: Self-pay | Admitting: Podiatry

## 2022-12-26 DIAGNOSIS — L97524 Non-pressure chronic ulcer of other part of left foot with necrosis of bone: Secondary | ICD-10-CM

## 2022-12-26 DIAGNOSIS — E1151 Type 2 diabetes mellitus with diabetic peripheral angiopathy without gangrene: Secondary | ICD-10-CM | POA: Diagnosis not present

## 2022-12-26 MED ORDER — DOXYCYCLINE HYCLATE 100 MG PO TABS: 100.0000 mg | ORAL_TABLET | Freq: Two times a day (BID) | ORAL | 0 refills | Status: AC

## 2022-12-26 NOTE — Addendum Note (Signed)
Addended byMaury Dus, Keontre Defino L on: 12/26/2022 11:05 AM   Modules accepted: Orders

## 2022-12-26 NOTE — Progress Notes (Addendum)
Subjective:  Patient ID: Danielle [name], female    DOB: 1945-12-24,   MRN: [id number]  Chief Complaint  Patient presents with   Wound Check    wound check 2 wks-left foot 2nd toe,no drainage or bleeding noticed,denies pain in the toe    77 y.o. female presents for follow-up of left second toe wound. .Relates improvement. No drainage pain or bleeding.   Denies any other pedal complaints. Denies n/v/f/c.   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HCC) 02/05/2017   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 05/20/2018   Acute kidney failure (HCC) 05/15/2018   Arthritis    "all over" (05/22/2018)   Blood dyscrasia    per pt-has small blood cells-appears as if anemic   Breast cancer, left breast (HCC) 1985   CHF (congestive heart failure) (HCC)    Complication of anesthesia    difficult to awaken from per pt   Coronary artery disease    Dyspnea    Essential hypertension    Heart disease    Heart murmur    History of gout    Hypothyroidism    Obesity (BMI 30-39.9) 11/11/2017   OSA (obstructive sleep apnea) 02/18/2017    severe obstructive sleep apnea with an AHI of 75.6/h and mild central sleep apnea with a CAI of 7.7/h.  Oxygen saturations dropped as low as 82%.   He is on CPAP at 9 cm H2O.   OSA on CPAP    Personal history of chemotherapy    Personal history of radiation therapy    Presence of permanent cardiac pacemaker    Primary localized osteoarthritis of right knee 05/13/2018   Refusal of blood transfusions as patient is Jehovah's Witness    Type II diabetes mellitus (HCC)     Objective:  Physical Exam: Vascular: DP/PT pulses 2/4 bilateral. CFT <3 seconds. Absent hair growth on digits. Edema noted to bilateral lower extremities. Xerosis noted bilaterally.  Skin. No lacerations or abrasions bilateral feet. Nails 1-5 bilateral  are thickened discolored and  with subungual debris. Left second digit  ulceration reopened with more swelling and drainage noted in the toe. Does probe deep to bone. Purulence noted. Wound about 1 cm x 1 cm x 0.5 cm  Musculoskeletal: MMT 5/5 bilateral lower extremities in DF, PF, Inversion and Eversion. Deceased ROM in DF of ankle joint.  Neurological: Sensation intact to light touch. Protective sensation diminished bilateral.   MRI left foot:  IMPRESSION: 1. Ulceration at the tip of the second toe with underlying osteomyelitis of the second distal phalanx.  Assessment:   1. 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 Type II diabetes mellitus with peripheral circulatory disorder [facility])              Plan:  Patient was evaluated and treated and all questions answered. Ulcer Left second digit with necorsis of bone -X-rays with erosions  noted to distal left second digit phalanx.  -Debridement as below. -Dressed with betadine, DSD. -Off-loading with surgical shoe.  -Doxycycline sent to pharmacy for four more weeks.  -Discussed glucose control and proper protein-rich diet.  Vascular procedure preformed on 3/4  -Wound culture obtained.  MRI showing osteomyelitis of the distal tip of the phalanx of the second digit. Discussed again these results with patient. Discussed concern that osteomyelitis is again acute osteomyelitis. Discussed her best option is amputation of the toe.  Patient is hesitant but does agree to proceed with amputation. Will plan for amputation this coming Tuesday.  -Informed surgical risk consent was reviewed and read aloud to the patient.  I reviewed the films.  I have discussed my findings with the patient in great detail.  I have discussed all risks including but not limited to infection, stiffness, scarring, limp, disability, deformity, damage to blood vessels and nerves, numbness, poor healing, need for braces, arthritis, chronic pain, amputation, death.  All benefits and realistic expectations discussed in  great detail.  I have made no promises as to the outcome.  I have provided realistic expectations.  I have offered the patient a 2nd opinion, which they have declined and assured me they preferred to proceed despite the risks. -Discussed if any worsening redness, pain, fever or chills to  report to the emergency room. Patient expressed understanding.   Procedure: Excisional Debridement of Wound Rationale: Removal of non-viable soft tissue from the wound to promote healing.  Anesthesia: none Pre-Debridement Wound Measurements: Ovelrying slough and hyperkeratosis  Post-Debridement Wound Measurements: 1 cm x 1 cm x 0.5 cm  Type of Debridement: Sharp Excisional Tissue Removed: Non-viable soft tissue, purulence noted  Depth of Debridement: subcutaneous tissue. Technique: Sharp excisional debridement to bleeding, viable wound base.  Dressing: Dry, sterile, compression dressing. Disposition: Patient tolerated procedure well. Patient to return in 2 week for follow-up.  Return in about 2 weeks (around 01/09/2023) for wound check.      Return in about 2 weeks (around 01/09/2023) for wound check.    Return in about 2 weeks (around 01/09/2023) for wound check.   Louann Sjogren, DPM

## 2022-12-26 NOTE — Addendum Note (Signed)
Addended byMaury Dus, Seabron Iannello L on: 12/26/2022 12:10 PM   Modules accepted: Orders

## 2022-12-27 ENCOUNTER — Other Ambulatory Visit (HOSPITAL_COMMUNITY): Payer: Self-pay
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
## 2022-12-31 DIAGNOSIS — E1165 Type 2 diabetes mellitus with hyperglycemia: Secondary | ICD-10-CM | POA: Diagnosis not present

## 2023-01-01 ENCOUNTER — Other Ambulatory Visit: Payer: Self-pay | Admitting: Podiatry

## 2023-01-01 DIAGNOSIS — M869 Osteomyelitis, unspecified: Secondary | ICD-10-CM | POA: Diagnosis not present

## 2023-01-01 DIAGNOSIS — L97524 Non-pressure chronic ulcer of other part of left foot with necrosis of bone: Secondary | ICD-10-CM | POA: Diagnosis not present

## 2023-01-01 DIAGNOSIS — M86172 Other acute osteomyelitis, left ankle and foot: Secondary | ICD-10-CM | POA: Diagnosis not present

## 2023-01-01 MED ORDER — ONDANSETRON HCL 4 MG PO TABS: 4.0000 mg | ORAL_TABLET | Freq: Three times a day (TID) | ORAL | 0 refills | Status: AC | PRN

## 2023-01-01 MED ORDER — OXYCODONE-ACETAMINOPHEN 5-325 MG PO TABS: 1.0000 | ORAL_TABLET | Freq: Three times a day (TID) | ORAL | 0 refills | Status: AC | PRN

## 2023-01-02 ENCOUNTER — Telehealth (HOSPITAL_COMMUNITY): Payer: Self-pay

## 2023-01-02 NOTE — Telephone Encounter (Signed)
Received a call from Bishop Limbo reporting she needs refills for her Humalog sent to [facility] through the [facility]. I contacted them and they report they have no refills on her Humalog. Message routed to [facility] Endocrinology triage for refills.   [facility]  [address] [place], [place] [address]  [phone]    Call complete.    [name], EMT-Paramedic [phone] 01/02/2023

## 2023-01-03 ENCOUNTER — Other Ambulatory Visit (HOSPITAL_COMMUNITY): Payer: Self-pay | Admitting: Emergency Medicine

## 2023-01-03 ENCOUNTER — Other Ambulatory Visit: Payer: Self-pay | Admitting: Internal Medicine

## 2023-01-03 NOTE — Progress Notes (Signed)
Paramedicine Encounter    Patient ID: Danielle [name], female    DOB: [birth year], 77 y.o.   MRN: [id number]   Complaints - Foot pain post toe amputation.   Assessment - Alert and oriented, +1 edema, lung sounds clear.   Compliance with meds - missed 3x doses, reported due to surgery. On last pen of humalog.   Pill box filled - for 1x week    Refills needed - Dexcom 7, humalog.    Meds changes since last visit - Zofran and Percocet as needed - and pt was reeducated on same    Social changes - Had toe amputated on left foot on 8/6. Family in home assisting with ADLs    VISIT SUMMARY**  Met with Danielle [name] in the home today. Pt was in good spirits. Pt reported better mood today than yesterday, advising that she was agitated due to increased pain in foot. Pt advised that she had misplaced her pain medication, but was found this am compliance had resumed. Danielle [name] denied any other complaints at this time. I reviewed with pt discharge instructions from surgery center and follow ups for same. I applied pt's dexcom and continued assessment as sensor warmed up. Filled pill box for 1x week. We reviewed all upcoming appointments. Will follow up in 1x week.   BP 110/70   Pulse 70   Resp 16   SpO2 98%  **Unable to weigh due to recent surgery.  Weight yesterday-DNW Last visit weight-201     ACTION: Home visit completed  [place] Setting EMT-P Community Paramedic  [phone]        Patient Care Team: Renford Dills, MD as PCP - General (Internal Medicine) Regan Lemming, MD as PCP - Electrophysiology (Cardiology) Quintella Reichert, MD as PCP - Sleep Medicine (Cardiology) Bensimhon, Bevelyn Buckles, MD as PCP - Cardiology (Cardiology) Burna Sis, LCSW as Social Worker (Licensed Clinical Social Worker) Shamleffer, Konrad Dolores, MD as Consulting Physician (Endocrinology)  Patient Active Problem List   Diagnosis Date Noted   Chronic systolic heart failure (HCC) 05/08/2022   Type 2  diabetes mellitus with stage 3a chronic kidney disease, with long-term current use of insulin (HCC) 02/16/2022   Prosthetic joint implant failure, initial encounter (HCC) 12/20/2021   Pseudophakia of both eyes 11/15/2021   DVT (deep venous thrombosis) (HCC) 07/31/2021   Acute blood loss anemia    Septic Arthritis, Infection of prosthetic right knee joint (HCC), MSSA infection 07/10/2021   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) 07/10/2021   CAD (coronary artery disease) 06/08/2021   Carotid artery disease (HCC) 06/08/2021   Symptomatic bradycardia 06/08/2021   Paroxysmal atrial fibrillation (HCC) 06/08/2021   CKD (chronic kidney disease), stage III (HCC) 06/08/2021   Polyneuropathy associated with underlying disease (HCC) 05/04/2020   Severe nonproliferative diabetic retinopathy of left eye, with macular edema, associated with type 2 diabetes mellitus (HCC) 11/05/2019   Severe nonproliferative diabetic retinopathy of right eye, with macular edema, associated with type 2 diabetes mellitus (HCC) 11/05/2019   Retinal hemorrhage of right eye 11/05/2019   Retinal hemorrhage of left eye 11/05/2019   Diabetes mellitus (HCC) 05/14/2019   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, with long-term current use of insulin (HCC) 05/14/2019   Type 2 diabetes mellitus with diabetic polyneuropathy, with long-term current use of insulin (HCC) 05/14/2019   History of gout 05/20/2018   Primary localized osteoarthritis of right knee 05/13/2018   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) 12/20/2017   Obesity (BMI 30-39.9)  11/11/2017   OSA (obstructive sleep apnea) 02/18/2017   Essential hypertension    L Breast Cancer    Hypothyroidism     Current Outpatient Medications:    acetaminophen (TYLENOL) 500 MG tablet, Take 500-1,000 mg by mouth every 6 (six) hours as needed (for pain or headaches)., Disp: , Rfl:    allopurinol (ZYLOPRIM) 100 MG tablet, Take 100 mg by mouth daily.,  Disp: , Rfl:    apixaban (ELIQUIS) 5 MG TABS tablet, Take 1 tablet (5 mg total) by mouth 2 (two) times daily., Disp: 60 tablet, Rfl: 11   aspirin EC 81 MG tablet, Take 1 tablet (81 mg total) by mouth daily. Swallow whole., Disp: 360 tablet, Rfl: 0   atorvastatin (LIPITOR) 80 MG tablet, Take 1 tablet (80 mg total) by mouth at bedtime., Disp: 90 tablet, Rfl: 3   B Complex-Biotin-FA (B-COMPLEX PO), Take by mouth., Disp: , Rfl:    Biotin 10 MG TABS, Take 10 mg by mouth daily., Disp: , Rfl:    Blood Glucose Monitoring Suppl (ONETOUCH VERIO) w/Device KIT, Use to check blood sugar, Disp: 1 kit, Rfl: 0   carvedilol (COREG) 3.125 MG tablet, Take 1 tablet (3.125 mg total) by mouth 2 (two) times daily with a meal., Disp: 180 tablet, Rfl: 3   Cinnamon 500 MG capsule, Take 500 mg by mouth daily., Disp: , Rfl:    clotrimazole-betamethasone (LOTRISONE) lotion, Apply 1 application topically 2 (two) times daily as needed (itching)., Disp: , Rfl:    Continuous Blood Gluc Sensor (DEXCOM G6 SENSOR) MISC, 1 Device by Does not apply route as directed., Disp: 9 each, Rfl: 3   doxycycline (VIBRA-TABS) 100 MG tablet, Take 1 tablet (100 mg total) by mouth 2 (two) times daily for 28 days., Disp: 56 tablet, Rfl: 0   empagliflozin (JARDIANCE) 10 MG TABS tablet, Take 1 tablet (10 mg total) by mouth daily., Disp: 30 tablet, Rfl: 11   ferrous sulfate 325 (65 FE) MG tablet, Take 325 mg by mouth 3 (three) times a week., Disp: , Rfl:    folic acid (FOLVITE) 1 MG tablet, Take 1 tablet (1 mg total) by mouth daily., Disp: 30 tablet, Rfl: 2   gabapentin (NEURONTIN) 300 MG capsule, Take 300 mg by mouth 2 (two) times daily., Disp: , Rfl:    glucose blood (ONETOUCH VERIO) test strip, Check blood sugar 2 times daily, Disp: 100 each, Rfl: 12   HUMALOG KWIKPEN 100 UNIT/ML KwikPen, DIAL AND INJECT UNDER THE SKIN THREE TIMES DAILY PER CORRECTION SCALE. MAX DAILY DOSE 50 UNITS., Disp: 60 mL, Rfl: 0   Insulin Glargine (BASAGLAR KWIKPEN) 100  UNIT/ML, Inject 40 Units into the skin at bedtime., Disp: 45 mL, Rfl: 2   Insulin Lispro (HUMALOG [place]), Inject 8 Units into the skin in the morning, at noon, and at bedtime., Disp: , Rfl:    Insulin Pen Needle 32G X 4 MM MISC, 1 Device by Does not apply route in the morning, at noon, in the evening, and at bedtime., Disp: 400 each, Rfl: 3   Lancets (ONETOUCH DELICA PLUS LANCET33G) MISC, USE   TO CHECK GLUCOSE 4 TIMES DAILY, Disp: 100 each, Rfl: 3   latanoprost (XALATAN) 0.005 % ophthalmic solution, Place 1 drop into both eyes at bedtime., Disp: , Rfl:    levothyroxine (SYNTHROID, LEVOTHROID) 75 MCG tablet, Take 75 mcg by mouth daily before breakfast., Disp: , Rfl:    LUMIGAN 0.01 % SOLN, Place 1 drop into both eyes at bedtime., Disp: , Rfl:  Multiple Vitamins-Minerals (ALIVE ONCE DAILY WOMENS 50+ PO), Take 1 tablet by mouth daily., Disp: , Rfl:    ondansetron (ZOFRAN) 4 MG tablet, Take 1 tablet (4 mg total) by mouth every 8 (eight) hours as needed for nausea or vomiting., Disp: 20 tablet, Rfl: 0   oxyCODONE-acetaminophen (PERCOCET/ROXICET) 5-325 MG tablet, Take 1 tablet by mouth every 8 (eight) hours as needed for up to 5 days for severe pain., Disp: 15 tablet, Rfl: 0   spironolactone (ALDACTONE) 25 MG tablet, Take 0.5 tablets (12.5 mg total) by mouth daily., Disp: 45 tablet, Rfl: 3   torsemide (DEMADEX) 20 MG tablet, Take 2 tablets (40 mg total) by mouth daily., Disp: 90 tablet, Rfl: 3   Travoprost, BAK Free, (TRAVATAN) 0.004 % SOLN ophthalmic solution, Place 1 drop into both eyes at bedtime., Disp: , Rfl:  Allergies  Allergen Reactions   Codeine Other (See Comments)   Other Other (See Comments)    Blood Product Refusal (Jehovah's witness)     Tape Other (See Comments)    Leaves blisters and marks on the skin   Sulfa Antibiotics Rash   Uloric [Febuxostat] Rash     Social History   Socioeconomic History   Marital status: Widowed    Spouse name: Not on file   Number of children: 3    Years of education: Not on file   Highest education level: Some college, no degree  Occupational History   Occupation: retired  Tobacco Use   Smoking status: Former    Current packs/day: 0.00    Average packs/day: 1 pack/day for 22.0 years (22.0 ttl pk-yrs)    Types: Cigarettes    Start date: 73    Quit date: 73    Years since quitting: 41.6   Smokeless tobacco: Never  Vaping Use   Vaping status: Never Used  Substance and Sexual Activity   Alcohol use: Yes    Comment: 05/22/2018 "glass of wine couple times/wk"   Drug use: Not Currently   Sexual activity: Not Currently    Birth control/protection: None, Post-menopausal  Other Topics Concern   Not on file  Social History Narrative   Not on file   Social Determinants of Health   Financial Resource Strain: Medium Risk (12/22/2021)   Overall Financial Resource Strain (CARDIA)    Difficulty of Paying Living Expenses: Somewhat hard  Food Insecurity: No Food Insecurity (05/08/2018)   Hunger Vital Sign    Worried About Running Out of Food in the Last Year: Never true    Ran Out of Food in the Last Year: Never true  Transportation Needs: Unmet Transportation Needs (05/08/2022)   PRAPARE - Administrator, Civil Service (Medical): Yes    Lack of Transportation (Non-Medical): Yes  Physical Activity: Not on file  Stress: Not on file  Social Connections: Not on file  Intimate Partner Violence: Not on file    Physical Exam      Future Appointments  Date Time Provider Department Center  01/08/2023  8:15 AM Louann Sjogren, DPM [facility]  01/16/2023 10:00 AM [facility]  01/16/2023 10:30 AM [facility]  01/16/2023 11:20 AM Maeola Harman, MD [facility]  01/22/2023  8:15 AM Louann Sjogren, DPM [facility]  01/29/2023  2:30 PM [facility] PA/NP [facility] None  02/05/2023 11:15 AM Louann Sjogren, DPM [facility]  02/22/2023 11:30 AM Shamleffer, Konrad Dolores, MD [facility] None  03/08/2023  7:05 AM [facility] DEVICE REMOTES  [facility]  06/07/2023  7:05 AM [facility] DEVICE REMOTES [facility]  09/06/2023  7:05 AM [facility] DEVICE REMOTES [facility]  12/06/2023  7:05 AM [facility] DEVICE REMOTES [facility]  03/06/2024  7:05 AM [facility] DEVICE REMOTES [facility]  06/05/2024  7:05 AM [facility] DEVICE REMOTES [facility]  09/04/2024  7:05 AM [facility] DEVICE REMOTES [facility]  12/04/2024  7:05 AM [facility] DEVICE REMOTES [facility]  03/05/2025  7:05 AM [facility] DEVICE REMOTES [facility]  06/04/2025  7:05 AM [facility] DEVICE REMOTES [facility]  09/03/2025  7:05 AM [facility] DEVICE REMOTES [facility]  12/03/2025  7:05 AM [facility] DEVICE REMOTES [facility]

## 2023-01-07 ENCOUNTER — Telehealth: Payer: Self-pay | Admitting: Podiatry

## 2023-01-07 ENCOUNTER — Other Ambulatory Visit (HOSPITAL_COMMUNITY): Payer: Self-pay

## 2023-01-07 NOTE — Telephone Encounter (Signed)
Called in Dexcom refills to [facility].   [name], EMT-Paramedic [phone] 01/07/2023

## 2023-01-07 NOTE — Telephone Encounter (Signed)
Spoke to [place] and made her aware of the $264 copay for her Dexcom with [facility] and she reports she cannot afford this and she states she will just use her manual glucometer because of this. Sarah and I will research any patient assistance for CGM products and follow up in the home on Thursday.   Jaslene agreed. Call complete.   [name], EMT-Paramedic [phone] 01/07/2023

## 2023-01-07 NOTE — Telephone Encounter (Signed)
Spoke with [facility] today and they advised that Danielle [name]'s Dexcoms are being held until she can pay her outstanding balance of $264 from previous co-pays to [place]. I did ensure Monia Pouch had been filed.   Will make pt aware and follow up accordingly.  [place] Setting EMT-P Community Paramedic  [phone]

## 2023-01-07 NOTE — Telephone Encounter (Signed)
DOS - 01/01/2023  AMPUTATION TOE MPJ JOINT 2ND LT - 08657  [employer] MEDICARE EFFECTIVE DATE- 05/28/2020  OOP - $4500 W/ $3424 REMAINING  PER VANESSA B AT [employer] MEDICARE, CPT CODE 84696 DOES NOT REQUIRE PRIOR AUTHORIZATION.  REFERENCE NUMBER - [id number]

## 2023-01-08 ENCOUNTER — Encounter: Payer: Self-pay | Admitting: Podiatry

## 2023-01-08 ENCOUNTER — Ambulatory Visit (INDEPENDENT_AMBULATORY_CARE_PROVIDER_SITE_OTHER): Payer: Medicare HMO | Admitting: Podiatry

## 2023-01-08 ENCOUNTER — Ambulatory Visit (INDEPENDENT_AMBULATORY_CARE_PROVIDER_SITE_OTHER): Payer: Medicare HMO

## 2023-01-08 DIAGNOSIS — Z9889 Other specified postprocedural states: Secondary | ICD-10-CM

## 2023-01-08 NOTE — Progress Notes (Signed)
Subjective:  Patient ID: Danielle [name], female    DOB: [birth year],  MRN: [id number]  No chief complaint on file.   DOS: 01/01/23  Procedure: Left second toe amputation   77 y.o. female returns for POV#1. Relates doing well and managing with pain   Review of Systems: Negative except as noted in the HPI. Denies N/V/F/Ch.  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HCC) 02/05/2017   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 05/20/2018   Acute kidney failure (HCC) 05/15/2018   Arthritis    "all over" (05/22/2018)   Blood dyscrasia    per pt-has small blood cells-appears as if anemic   Breast cancer, left breast (HCC) 1985   CHF (congestive heart failure) (HCC)    Complication of anesthesia    difficult to awaken from per pt   Coronary artery disease    Dyspnea    Essential hypertension    Heart disease    Heart murmur    History of gout    Hypothyroidism    Obesity (BMI 30-39.9) 11/11/2017   OSA (obstructive sleep apnea) 02/18/2017    severe obstructive sleep apnea with an AHI of 75.6/h and mild central sleep apnea with a CAI of 7.7/h.  Oxygen saturations dropped as low as 82%.   He is on CPAP at 9 cm H2O.   OSA on CPAP    Personal history of chemotherapy    Personal history of radiation therapy    Presence of permanent cardiac pacemaker    Primary localized osteoarthritis of right knee 05/13/2018   Refusal of blood transfusions as patient is Jehovah's Witness    Type II diabetes mellitus (HCC)     Current Outpatient Medications:    acetaminophen (TYLENOL) 500 MG tablet, Take 500-1,000 mg by mouth every 6 (six) hours as needed (for pain or headaches)., Disp: , Rfl:    allopurinol (ZYLOPRIM) 100 MG tablet, Take 100 mg by mouth daily., Disp: , Rfl:    apixaban (ELIQUIS) 5 MG TABS tablet, Take 1 tablet (5 mg total) by mouth 2 (two) times daily., Disp: 60 tablet, Rfl: 11   aspirin EC 81 MG tablet, Take 1  tablet (81 mg total) by mouth daily. Swallow whole., Disp: 360 tablet, Rfl: 0   atorvastatin (LIPITOR) 80 MG tablet, Take 1 tablet (80 mg total) by mouth at bedtime., Disp: 90 tablet, Rfl: 3   B Complex-Biotin-FA (B-COMPLEX PO), Take by mouth., Disp: , Rfl:    Biotin 10 MG TABS, Take 10 mg by mouth daily., Disp: , Rfl:    Blood Glucose Monitoring Suppl (ONETOUCH VERIO) w/Device KIT, Use to check blood sugar, Disp: 1 kit, Rfl: 0   carvedilol (COREG) 3.125 MG tablet, Take 1 tablet (3.125 mg total) by mouth 2 (two) times daily with a meal., Disp: 180 tablet, Rfl: 3   Cinnamon 500 MG capsule, Take 500 mg by mouth daily., Disp: , Rfl:    clotrimazole-betamethasone (LOTRISONE) lotion, Apply 1 application topically 2 (two) times daily as needed (itching)., Disp: , Rfl:    Continuous Blood Gluc Sensor (DEXCOM G6 SENSOR) MISC, 1 Device by Does not apply route as directed., Disp: 9 each, Rfl: 3   doxycycline (VIBRA-TABS) 100 MG tablet, Take 1 tablet (100 mg total) by mouth 2 (two) times daily for 28 days., Disp: 56 tablet, Rfl: 0   empagliflozin (JARDIANCE) 10 MG TABS tablet, Take 1 tablet (10 mg total) by mouth daily., Disp:  30 tablet, Rfl: 11   ferrous sulfate 325 (65 FE) MG tablet, Take 325 mg by mouth 3 (three) times a week., Disp: , Rfl:    folic acid (FOLVITE) 1 MG tablet, Take 1 tablet (1 mg total) by mouth daily., Disp: 30 tablet, Rfl: 2   gabapentin (NEURONTIN) 300 MG capsule, Take 300 mg by mouth 2 (two) times daily., Disp: , Rfl:    glucose blood (ONETOUCH VERIO) test strip, Check blood sugar 2 times daily, Disp: 100 each, Rfl: 12   HUMALOG KWIKPEN 100 UNIT/ML KwikPen, DIAL AND INJECT UNDER THE SKIN THREE TIMES DAILY PER CORRECTION SCALE. MAX DAILY DOSE 50 UNITS., Disp: 60 mL, Rfl: 0   Insulin Glargine (BASAGLAR KWIKPEN) 100 UNIT/ML, Inject 40 Units into the skin at bedtime., Disp: 45 mL, Rfl: 2   Insulin Lispro (HUMALOG [place]), Inject 8 Units into the skin in the morning, at noon, and at bedtime.,  Disp: , Rfl:    Insulin Pen Needle 32G X 4 MM MISC, 1 Device by Does not apply route in the morning, at noon, in the evening, and at bedtime., Disp: 400 each, Rfl: 3   Lancets (ONETOUCH DELICA PLUS LANCET33G) MISC, USE   TO CHECK GLUCOSE 4 TIMES DAILY, Disp: 100 each, Rfl: 3   latanoprost (XALATAN) 0.005 % ophthalmic solution, Place 1 drop into both eyes at bedtime., Disp: , Rfl:    levothyroxine (SYNTHROID, LEVOTHROID) 75 MCG tablet, Take 75 mcg by mouth daily before breakfast., Disp: , Rfl:    LUMIGAN 0.01 % SOLN, Place 1 drop into both eyes at bedtime., Disp: , Rfl:    Multiple Vitamins-Minerals (ALIVE ONCE DAILY WOMENS 50+ PO), Take 1 tablet by mouth daily., Disp: , Rfl:    ondansetron (ZOFRAN) 4 MG tablet, Take 1 tablet (4 mg total) by mouth every 8 (eight) hours as needed for nausea or vomiting., Disp: 20 tablet, Rfl: 0   spironolactone (ALDACTONE) 25 MG tablet, Take 0.5 tablets (12.5 mg total) by mouth daily., Disp: 45 tablet, Rfl: 3   torsemide (DEMADEX) 20 MG tablet, Take 2 tablets (40 mg total) by mouth daily., Disp: 90 tablet, Rfl: 3   Travoprost, BAK Free, (TRAVATAN) 0.004 % SOLN ophthalmic solution, Place 1 drop into both eyes at bedtime., Disp: , Rfl:   Social History   Tobacco Use  Smoking Status Former   Current packs/day: 0.00   Average packs/day: 1 pack/day for 22.0 years (22.0 ttl pk-yrs)   Types: Cigarettes   Start date: 37   Quit date: 71   Years since quitting: 41.6  Smokeless Tobacco Never    Allergies  Allergen Reactions   Codeine Other (See Comments)   Other Other (See Comments)    Blood Product Refusal (Jehovah's witness)     Tape Other (See Comments)    Leaves blisters and marks on the skin   Sulfa Antibiotics Rash   Uloric [Febuxostat] Rash   Objective:  There were no vitals filed for this visit. There is no height or weight on file to calculate BMI. Constitutional Well developed. Well nourished.  Vascular Foot warm and well  perfused. Capillary refill normal to all digits.   Neurologic Normal speech. Oriented to person, place, and time. Epicritic sensation to light touch grossly present bilaterally.  Dermatologic Skin healing well without signs of infection. Skin edges well coapted without signs of infection.  Orthopedic: Tenderness to palpation noted about the surgical site.   Radiographs: Interval removal of left second digit  Assessment:   1.  Post-operative state    Plan:  Patient was evaluated and treated and all questions answered.  S/p foot surgery left -Progressing as expected post-operatively. -WB Status: WBAT in surgical shoe -Sutures: intact. -Medications: n/a -Foot redressed.  Follow-up in 2 weeks for suture removal.   No follow-ups on file.

## 2023-01-10 ENCOUNTER — Other Ambulatory Visit (HOSPITAL_COMMUNITY): Payer: Self-pay

## 2023-01-10 NOTE — Progress Notes (Signed)
Paramedicine Encounter    Patient ID: Danielle [name], female    DOB: [birth year], 77 y.o.   MRN: [id number]   Complaints-Constipation- but, reporting feeling much better this week mentally and physically.   Assessment- CAOX4, warm and dry with no complaints of chest pain, no dizziness, no shortness of breath, no lower leg swelling, lungs clear. Blood sugar- 304 (Humalog 15 units at 1250) Vitals obtained.   Compliance with meds- missed a few iron pills over the last week- reports they are giving her constipation.   Pill box filled- for two weeks   Refills needed-  Allupurinol- none in week two box  Jardiance- none in week two box  Levothyroxine- needs one pill Thursday morning    Meds changes since last visit- none     Social changes- none    VISIT SUMMARY -Arrived for home visit for [facility] who reports to be feeling well this week with complaints of constipation. She denied any shortness of breath, dizziness, chest pain, palpitations, swelling or major weight gain. I obtained vitals as noted. Patient's blood sugar 304 she had just eaten and took Humalog 15 units at 1250 during our visit. She should be getting refills for Humalog through [employer] [facility] in the next few days. She has a dexcom on now and has one more sensor left- the copay for receiving the next shipment is $264 she reports she cannot afford this and plans to use her manual glucometer once the dexcoms run out. Meds reviewed and pill box filled for two weeks. Refills as noted and will be called in next week to [facility]. We reviewed upcoming appointments and confirmed same. I explained to Zaniyah that [name] will be covering home visits and follow up from now on as I start light duty next week- she is aware and agreeable and knows to reach out to the [facility] phone for any concerns. Home visit complete. [name] will follow up in two weeks.   [name], EMT-Paramedic [phone] 01/10/2023   BP 120/72   Pulse 76    Resp 16   Wt 205 lb (93 kg)   SpO2 97%   BMI 35.19 kg/m  Weight yesterday-- DNW  Last visit weight-- 201LBS      ACTION: Home visit completed     Patient Care Team: Renford Dills, MD as PCP - General (Internal Medicine) Regan Lemming, MD as PCP - Electrophysiology (Cardiology) Quintella Reichert, MD as PCP - Sleep Medicine (Cardiology) Bensimhon, Bevelyn Buckles, MD as PCP - Cardiology (Cardiology) Burna Sis, LCSW as Social Worker (Licensed Clinical Social Worker) Shamleffer, Konrad Dolores, MD as Consulting Physician (Endocrinology)  Patient Active Problem List   Diagnosis Date Noted   Chronic systolic heart failure (HCC) 05/08/2022   Type 2 diabetes mellitus with stage 3a chronic kidney disease, with long-term current use of insulin (HCC) 02/16/2022   Prosthetic joint implant failure, initial encounter (HCC) 12/20/2021   Pseudophakia of both eyes 11/15/2021   DVT (deep venous thrombosis) (HCC) 07/31/2021   Acute blood loss anemia    Septic Arthritis, Infection of prosthetic right knee joint (HCC), MSSA infection 07/10/2021   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) 07/10/2021   CAD (coronary artery disease) 06/08/2021   Carotid artery disease (HCC) 06/08/2021   Symptomatic bradycardia 06/08/2021   Paroxysmal atrial fibrillation (HCC) 06/08/2021   CKD (chronic kidney disease), stage III (HCC) 06/08/2021   Polyneuropathy associated with underlying disease (HCC) 05/04/2020   Severe nonproliferative diabetic retinopathy of left eye, with macular  edema, associated with type 2 diabetes mellitus (HCC) 11/05/2019   Severe nonproliferative diabetic retinopathy of right eye, with macular edema, associated with type 2 diabetes mellitus (HCC) 11/05/2019   Retinal hemorrhage of right eye 11/05/2019   Retinal hemorrhage of left eye 11/05/2019   Diabetes mellitus (HCC) 05/14/2019   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, with long-term current use of  insulin (HCC) 05/14/2019   Type 2 diabetes mellitus with diabetic polyneuropathy, with long-term current use of insulin (HCC) 05/14/2019   History of gout 05/20/2018   Primary localized osteoarthritis of right knee 05/13/2018   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) 12/20/2017   Obesity (BMI 30-39.9) 11/11/2017   OSA (obstructive sleep apnea) 02/18/2017   Essential hypertension    L Breast Cancer    Hypothyroidism     Current Outpatient Medications:    acetaminophen (TYLENOL) 500 MG tablet, Take 500-1,000 mg by mouth every 6 (six) hours as needed (for pain or headaches)., Disp: , Rfl:    allopurinol (ZYLOPRIM) 100 MG tablet, Take 100 mg by mouth daily., Disp: , Rfl:    apixaban (ELIQUIS) 5 MG TABS tablet, Take 1 tablet (5 mg total) by mouth 2 (two) times daily., Disp: 60 tablet, Rfl: 11   aspirin EC 81 MG tablet, Take 1 tablet (81 mg total) by mouth daily. Swallow whole., Disp: 360 tablet, Rfl: 0   atorvastatin (LIPITOR) 80 MG tablet, Take 1 tablet (80 mg total) by mouth at bedtime., Disp: 90 tablet, Rfl: 3   B Complex-Biotin-FA (B-COMPLEX PO), Take by mouth., Disp: , Rfl:    Biotin 10 MG TABS, Take 10 mg by mouth daily., Disp: , Rfl:    Blood Glucose Monitoring Suppl (ONETOUCH VERIO) w/Device KIT, Use to check blood sugar, Disp: 1 kit, Rfl: 0   carvedilol (COREG) 3.125 MG tablet, Take 1 tablet (3.125 mg total) by mouth 2 (two) times daily with a meal., Disp: 180 tablet, Rfl: 3   Cinnamon 500 MG capsule, Take 500 mg by mouth daily., Disp: , Rfl:    clotrimazole-betamethasone (LOTRISONE) lotion, Apply 1 application topically 2 (two) times daily as needed (itching)., Disp: , Rfl:    Continuous Blood Gluc Sensor (DEXCOM G6 SENSOR) MISC, 1 Device by Does not apply route as directed., Disp: 9 each, Rfl: 3   doxycycline (VIBRA-TABS) 100 MG tablet, Take 1 tablet (100 mg total) by mouth 2 (two) times daily for 28 days., Disp: 56 tablet, Rfl: 0   empagliflozin (JARDIANCE) 10 MG TABS tablet,  Take 1 tablet (10 mg total) by mouth daily., Disp: 30 tablet, Rfl: 11   ferrous sulfate 325 (65 FE) MG tablet, Take 325 mg by mouth 3 (three) times a week., Disp: , Rfl:    folic acid (FOLVITE) 1 MG tablet, Take 1 tablet (1 mg total) by mouth daily., Disp: 30 tablet, Rfl: 2   gabapentin (NEURONTIN) 300 MG capsule, Take 300 mg by mouth 2 (two) times daily., Disp: , Rfl:    glucose blood (ONETOUCH VERIO) test strip, Check blood sugar 2 times daily, Disp: 100 each, Rfl: 12   HUMALOG KWIKPEN 100 UNIT/ML KwikPen, DIAL AND INJECT UNDER THE SKIN THREE TIMES DAILY PER CORRECTION SCALE. MAX DAILY DOSE 50 UNITS., Disp: 60 mL, Rfl: 0   Insulin Glargine (BASAGLAR KWIKPEN) 100 UNIT/ML, Inject 40 Units into the skin at bedtime., Disp: 45 mL, Rfl: 2   Insulin Lispro (HUMALOG [place]), Inject 8 Units into the skin in the morning, at noon, and at bedtime., Disp: ,  Rfl:    Insulin Pen Needle 32G X 4 MM MISC, 1 Device by Does not apply route in the morning, at noon, in the evening, and at bedtime., Disp: 400 each, Rfl: 3   Lancets (ONETOUCH DELICA PLUS LANCET33G) MISC, USE   TO CHECK GLUCOSE 4 TIMES DAILY, Disp: 100 each, Rfl: 3   latanoprost (XALATAN) 0.005 % ophthalmic solution, Place 1 drop into both eyes at bedtime., Disp: , Rfl:    levothyroxine (SYNTHROID, LEVOTHROID) 75 MCG tablet, Take 75 mcg by mouth daily before breakfast., Disp: , Rfl:    LUMIGAN 0.01 % SOLN, Place 1 drop into both eyes at bedtime., Disp: , Rfl:    Multiple Vitamins-Minerals (ALIVE ONCE DAILY WOMENS 50+ PO), Take 1 tablet by mouth daily., Disp: , Rfl:    ondansetron (ZOFRAN) 4 MG tablet, Take 1 tablet (4 mg total) by mouth every 8 (eight) hours as needed for nausea or vomiting., Disp: 20 tablet, Rfl: 0   spironolactone (ALDACTONE) 25 MG tablet, Take 0.5 tablets (12.5 mg total) by mouth daily., Disp: 45 tablet, Rfl: 3   torsemide (DEMADEX) 20 MG tablet, Take 2 tablets (40 mg total) by mouth daily., Disp: 90 tablet, Rfl: 3   Travoprost, BAK Free,  (TRAVATAN) 0.004 % SOLN ophthalmic solution, Place 1 drop into both eyes at bedtime., Disp: , Rfl:  Allergies  Allergen Reactions   Codeine Other (See Comments)   Other Other (See Comments)    Blood Product Refusal (Jehovah's witness)     Tape Other (See Comments)    Leaves blisters and marks on the skin   Sulfa Antibiotics Rash   Uloric [Febuxostat] Rash     Social History   Socioeconomic History   Marital status: Widowed    Spouse name: Not on file   Number of children: 3   Years of education: Not on file   Highest education level: Some college, no degree  Occupational History   Occupation: retired  Tobacco Use   Smoking status: Former    Current packs/day: 0.00    Average packs/day: 1 pack/day for 22.0 years (22.0 ttl pk-yrs)    Types: Cigarettes    Start date: 75    Quit date: 4    Years since quitting: 41.6   Smokeless tobacco: Never  Vaping Use   Vaping status: Never Used  Substance and Sexual Activity   Alcohol use: Yes    Comment: 05/22/2018 "glass of wine couple times/wk"   Drug use: Not Currently   Sexual activity: Not Currently    Birth control/protection: None, Post-menopausal  Other Topics Concern   Not on file  Social History Narrative   Not on file   Social Determinants of Health   Financial Resource Strain: Medium Risk (12/22/2021)   Overall Financial Resource Strain (CARDIA)    Difficulty of Paying Living Expenses: Somewhat hard  Food Insecurity: No Food Insecurity (05/08/2018)   Hunger Vital Sign    Worried About Running Out of Food in the Last Year: Never true    Ran Out of Food in the Last Year: Never true  Transportation Needs: Unmet Transportation Needs (05/08/2022)   PRAPARE - Administrator, Civil Service (Medical): Yes    Lack of Transportation (Non-Medical): Yes  Physical Activity: Not on file  Stress: Not on file  Social Connections: Not on file  Intimate Partner Violence: Not on file    Physical  Exam      Future Appointments  Date Time Provider Department Center  01/16/2023 10:00 AM [facility]  01/16/2023 10:30 AM [facility]  01/16/2023 11:20 AM Maeola Harman, MD [facility]  01/22/2023  1:15 PM Louann Sjogren, DPM [facility]  01/29/2023  2:30 PM [facility] PA/NP [facility] None  02/05/2023 11:15 AM Louann Sjogren, DPM [facility]  02/22/2023 11:30 AM Shamleffer, Konrad Dolores, MD [facility] None  03/08/2023  7:05 AM [facility] DEVICE REMOTES [facility]  06/07/2023  7:05 AM [facility] DEVICE REMOTES [facility]  09/06/2023  7:05 AM [facility] DEVICE REMOTES [facility]  12/06/2023  7:05 AM [facility] DEVICE REMOTES [facility]  03/06/2024  7:05 AM [facility] DEVICE REMOTES [facility]  06/05/2024  7:05 AM [facility] DEVICE REMOTES [facility]  09/04/2024  7:05 AM [facility] DEVICE REMOTES [facility]  12/04/2024  7:05 AM [facility] DEVICE REMOTES [facility]  03/05/2025  7:05 AM [facility] DEVICE REMOTES [facility]  06/04/2025  7:05 AM [facility] DEVICE REMOTES [facility]  09/03/2025  7:05 AM [facility] DEVICE REMOTES [facility]  12/03/2025  7:05 AM [facility] DEVICE REMOTES [facility]

## 2023-01-14 ENCOUNTER — Telehealth (HOSPITAL_COMMUNITY): Payer: Self-pay

## 2023-01-14 NOTE — Telephone Encounter (Signed)
Called in the following refills to [facility] on [address]. -Allupurinol -Jardiance -Levothyroxine   All will be ready for pickup tomorrow 8/20  [name], EMT-Paramedic [phone] 01/14/2023

## 2023-01-16 ENCOUNTER — Ambulatory Visit (INDEPENDENT_AMBULATORY_CARE_PROVIDER_SITE_OTHER): Payer: Medicare HMO | Admitting: Vascular Surgery

## 2023-01-16 ENCOUNTER — Encounter: Payer: Self-pay | Admitting: Vascular Surgery

## 2023-01-16 ENCOUNTER — Ambulatory Visit (HOSPITAL_COMMUNITY)
Admission: RE | Admit: 2023-01-16 | Discharge: 2023-01-16 | Disposition: A | Discharge status: Home / Self Care | Payer: Medicare HMO | Source: Ambulatory Visit | Attending: Vascular Surgery | Admitting: Vascular Surgery

## 2023-01-16 ENCOUNTER — Ambulatory Visit (HOSPITAL_COMMUNITY): Payer: Medicare HMO

## 2023-01-16 VITALS — BP 165/90 | HR 66 | Temp 97.9°F | Resp 20 | Ht 64.0 in | Wt 200.0 lb

## 2023-01-16 DIAGNOSIS — I70222 Atherosclerosis of native arteries of extremities with rest pain, left leg: Secondary | ICD-10-CM

## 2023-01-16 DIAGNOSIS — I739 Peripheral vascular disease, unspecified: Secondary | ICD-10-CM

## 2023-01-16 LAB — VAS US ABI WITH/WO TBI

## 2023-01-16 NOTE — Progress Notes (Signed)
Patient ID: Danielle [name], female   DOB: 06/23/1945, 77 y.o.   MRN: [id number]  Reason for Consult: Follow-up   Referred by Renford Dills, MD  Subjective:     HPI:  Aerith [name] is a 77 y.o. female history of left anterior tibial artery angioplasty for second toe ulceration.  She has not had the toe amputated which she states is healing well.  She is here for follow-up with noninvasive studies today.  She is walking with the help of a boot and a walker today.  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HCC) 02/05/2017   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 05/20/2018   Acute kidney failure (HCC) 05/15/2018   Arthritis    "all over" (05/22/2018)   Blood dyscrasia    per pt-has small blood cells-appears as if anemic   Breast cancer, left breast (HCC) 1985   CHF (congestive heart failure) (HCC)    Complication of anesthesia    difficult to awaken from per pt   Coronary artery disease    Dyspnea    Essential hypertension    Heart disease    Heart murmur    History of gout    Hypothyroidism    Obesity (BMI 30-39.9) 11/11/2017   OSA (obstructive sleep apnea) 02/18/2017    severe obstructive sleep apnea with an AHI of 75.6/h and mild central sleep apnea with a CAI of 7.7/h.  Oxygen saturations dropped as low as 82%.   He is on CPAP at 9 cm H2O.   OSA on CPAP    Personal history of chemotherapy    Personal history of radiation therapy    Presence of permanent cardiac pacemaker    Primary localized osteoarthritis of right knee 05/13/2018   Refusal of blood transfusions as patient is Jehovah's Witness    Type II diabetes mellitus (HCC)    Family History  Problem Relation Age of Onset   Multiple myeloma Mother    Heart disease Father    Other Sister    Heart disease Brother    Diabetes Sister    Other Sister    Past Surgical History:  Procedure Laterality Date   ABDOMINAL AORTOGRAM W/LOWER  EXTREMITY N/A 07/30/2022   Procedure: ABDOMINAL AORTOGRAM W/LOWER EXTREMITY;  Surgeon: Maeola Harman, MD;  Location: [facility] INVASIVE CV LAB;  Service: Cardiovascular;  Laterality: N/A;   APPLICATION OF WOUND VAC Right 04/14/2021   Procedure: APPLICATION OF WOUND VAC;  Surgeon: Joen Laura, MD;  Location: [facility] OR;  Service: Orthopedics;  Laterality: Right;   BREAST BIOPSY Left 1985   BREAST LUMPECTOMY Left 1985   BREAST LUMPECTOMY WITH NEEDLE LOCALIZATION AND AXILLARY LYMPH NODE DISSECTION  1985   CATARACT EXTRACTION W/ INTRAOCULAR LENS  IMPLANT, BILATERAL Bilateral    CORONARY ANGIOPLASTY WITH STENT PLACEMENT  2010, 2012,2017    2 done 2010 and 1 replaced 2012 and 2 replaced in 2017   DILATION AND CURETTAGE OF UTERUS     EXCISIONAL TOTAL KNEE ARTHROPLASTY WITH ANTIBIOTIC SPACERS Right 07/12/2021   Procedure: EXCISIONAL TOTAL KNEE ARTHROPLASTY WITH ANTIBIOTIC SPACERS;  Surgeon: Joen Laura, MD;  Location: [facility] OR;  Service: Orthopedics;  Laterality: Right;   I & D KNEE WITH POLY EXCHANGE Right 04/14/2021   Procedure: IRRIGATION AND DEBRIDEMENT KNEE WITH POLY EXCHANGE;  Surgeon: Joen Laura, MD;  Location: [facility] OR;  Service: Orthopedics;  Laterality: Right;   INSERT / REPLACE / REMOVE  PACEMAKER  07/19/2014   JOINT REPLACEMENT     PERIPHERAL VASCULAR ATHERECTOMY  07/30/2022   Procedure: PERIPHERAL VASCULAR ATHERECTOMY;  Surgeon: Maeola Harman, MD;  Location: [facility] INVASIVE CV LAB;  Service: Cardiovascular;;   PERIPHERAL VASCULAR INTERVENTION  07/30/2022   Procedure: PERIPHERAL VASCULAR INTERVENTION;  Surgeon: Maeola Harman, MD;  Location: [facility] INVASIVE CV LAB;  Service: Cardiovascular;;   REPLACEMENT TOTAL KNEE Left 09/2012   THYROIDECTOMY, PARTIAL  1978   TONSILLECTOMY     TOTAL KNEE ARTHROPLASTY Right 05/13/2018   Procedure: TOTAL KNEE ARTHROPLASTY;  Surgeon: Teryl Lucy, MD;  Location: [facility] ORS;  Service: Orthopedics;  Laterality: Right;   TOTAL KNEE  REVISION Right 12/20/2021   Procedure: REMOVAL OF RIGHT KNEE ANTIBIOTIC SPACER AND REVISION RIGHT KNEE TOTAL ARTHROPLASTY;  Surgeon: Joen Laura, MD;  Location: [facility] OR;  Service: Orthopedics;  Laterality: Right;   TUBAL LIGATION      Short Social History:  Social History   Tobacco Use   Smoking status: Former    Current packs/day: 0.00    Average packs/day: 1 pack/day for 22.0 years (22.0 ttl pk-yrs)    Types: Cigarettes    Start date: 34    Quit date: 85    Years since quitting: 41.6   Smokeless tobacco: Never  Substance Use Topics   Alcohol use: Yes    Comment: 05/22/2018 "glass of wine couple times/wk"    Allergies  Allergen Reactions   Codeine Other (See Comments)   Other Other (See Comments)    Blood Product Refusal (Jehovah's witness)     Tape Other (See Comments)    Leaves blisters and marks on the skin   Sulfa Antibiotics Rash   Uloric [Febuxostat] Rash    Current Outpatient Medications  Medication Sig Dispense Refill   acetaminophen (TYLENOL) 500 MG tablet Take 500-1,000 mg by mouth every 6 (six) hours as needed (for pain or headaches).     allopurinol (ZYLOPRIM) 100 MG tablet Take 100 mg by mouth daily.     apixaban (ELIQUIS) 5 MG TABS tablet Take 1 tablet (5 mg total) by mouth 2 (two) times daily. 60 tablet 11   aspirin EC 81 MG tablet Take 1 tablet (81 mg total) by mouth daily. Swallow whole. 360 tablet 0   atorvastatin (LIPITOR) 80 MG tablet Take 1 tablet (80 mg total) by mouth at bedtime. 90 tablet 3   B Complex-Biotin-FA (B-COMPLEX PO) Take by mouth.     Biotin 10 MG TABS Take 10 mg by mouth daily.     Blood Glucose Monitoring Suppl (ONETOUCH VERIO) w/Device KIT Use to check blood sugar 1 kit 0   carvedilol (COREG) 3.125 MG tablet Take 1 tablet (3.125 mg total) by mouth 2 (two) times daily with a meal. 180 tablet 3   Cinnamon 500 MG capsule Take 500 mg by mouth daily.     clotrimazole-betamethasone (LOTRISONE) lotion Apply 1 application  topically 2 (two) times daily as needed (itching).     Continuous Blood Gluc Sensor (DEXCOM G6 SENSOR) MISC 1 Device by Does not apply route as directed. 9 each 3   doxycycline (VIBRA-TABS) 100 MG tablet Take 1 tablet (100 mg total) by mouth 2 (two) times daily for 28 days. 56 tablet 0   empagliflozin (JARDIANCE) 10 MG TABS tablet Take 1 tablet (10 mg total) by mouth daily. 30 tablet 11   ferrous sulfate 325 (65 FE) MG tablet Take 325 mg by mouth 3 (three) times a week.  folic acid (FOLVITE) 1 MG tablet Take 1 tablet (1 mg total) by mouth daily. 30 tablet 2   gabapentin (NEURONTIN) 300 MG capsule Take 300 mg by mouth 2 (two) times daily.     glucose blood (ONETOUCH VERIO) test strip Check blood sugar 2 times daily 100 each 12   HUMALOG KWIKPEN 100 UNIT/ML KwikPen DIAL AND INJECT UNDER THE SKIN THREE TIMES DAILY PER CORRECTION SCALE. MAX DAILY DOSE 50 UNITS. 60 mL 0   Insulin Glargine (BASAGLAR KWIKPEN) 100 UNIT/ML Inject 40 Units into the skin at bedtime. 45 mL 2   Insulin Lispro (HUMALOG [place]) Inject 8 Units into the skin in the morning, at noon, and at bedtime.     Insulin Pen Needle 32G X 4 MM MISC 1 Device by Does not apply route in the morning, at noon, in the evening, and at bedtime. 400 each 3   Lancets (ONETOUCH DELICA PLUS LANCET33G) MISC USE   TO CHECK GLUCOSE 4 TIMES DAILY 100 each 3   latanoprost (XALATAN) 0.005 % ophthalmic solution Place 1 drop into both eyes at bedtime.     levothyroxine (SYNTHROID, LEVOTHROID) 75 MCG tablet Take 75 mcg by mouth daily before breakfast.     LUMIGAN 0.01 % SOLN Place 1 drop into both eyes at bedtime.     Multiple Vitamins-Minerals (ALIVE ONCE DAILY WOMENS 50+ PO) Take 1 tablet by mouth daily.     ondansetron (ZOFRAN) 4 MG tablet Take 1 tablet (4 mg total) by mouth every 8 (eight) hours as needed for nausea or vomiting. 20 tablet 0   spironolactone (ALDACTONE) 25 MG tablet Take 0.5 tablets (12.5 mg total) by mouth daily. 45 tablet 3   torsemide  (DEMADEX) 20 MG tablet Take 2 tablets (40 mg total) by mouth daily. 90 tablet 3   Travoprost, BAK Free, (TRAVATAN) 0.004 % SOLN ophthalmic solution Place 1 drop into both eyes at bedtime.     No current facility-administered medications for this visit.    Review of Systems  Constitutional:  Constitutional negative. HENT: HENT negative.  Eyes: Eyes negative.  Respiratory: Respiratory negative.  Cardiovascular: Cardiovascular negative.  GI: Gastrointestinal negative.  Musculoskeletal: Positive for gait problem.  Skin: Skin negative.  Hematologic: Hematologic/lymphatic negative.  Psychiatric: Psychiatric negative.        Objective:  Objective   Vitals:   01/16/23 1117  BP: (!) 165/90  Pulse: 66  Resp: 20  Temp: 97.9 F (36.6 C)  SpO2: 96%  Weight: 200 lb (90.7 kg)  Height: 5\' 4"  (1.626 m)   Body mass index is 34.33 kg/m.  Physical Exam HENT:     Head: Normocephalic.  Eyes:     Pupils: Pupils are equal, round, and reactive to light.  Cardiovascular:     Comments: Very strong left anterior tibial artery signal can be traced onto the foot as the dorsalis pedis Abdominal:     General: Abdomen is flat.  Musculoskeletal:     Cervical back: Normal range of motion.     Right lower leg: Edema present.     Left lower leg: Edema present.     Comments: She is wearing a boot on her left foot with a dressing over her second toe amputation site  Neurological:     General: No focal deficit present.     Mental Status: She is alert.  Psychiatric:        Mood and Affect: Mood normal.        Thought Content: Thought content normal.  Data: +-----------+--------+-----+--------+----------+--------+  LEFT      PSV cm/sRatioStenosisWaveform  Comments  +-----------+--------+-----+--------+----------+--------+  CFA Prox   101                  triphasic           +-----------+--------+-----+--------+----------+--------+  DFA       94                   biphasic             +-----------+--------+-----+--------+----------+--------+  SFA Prox   98                   triphasic           +-----------+--------+-----+--------+----------+--------+  SFA Mid    116                  triphasic           +-----------+--------+-----+--------+----------+--------+  SFA Distal 70                   triphasic           +-----------+--------+-----+--------+----------+--------+  POP Prox   68                   biphasic            +-----------+--------+-----+--------+----------+--------+  ATA Distal 45                   monophasic          +-----------+--------+-----+--------+----------+--------+  PTA Distal 57                   monophasic          +-----------+--------+-----+--------+----------+--------+  PERO Distal16                   monophasic          +-----------+--------+-----+--------+----------+--------+     Summary:  Right: Patent lower extremity without obvious evidence of stenosis.   ABI Findings:  +---------+------------------+-----+-----------+--------+  Right   Rt Pressure (mmHg)IndexWaveform   Comment   +---------+------------------+-----+-----------+--------+  Brachial 165                                         +---------+------------------+-----+-----------+--------+  PTA     237               1.42 multiphasic          +---------+------------------+-----+-----------+--------+  DP      255               1.53 multiphasic          +---------+------------------+-----+-----------+--------+  Great Toe157               0.94 Normal               +---------+------------------+-----+-----------+--------+   +---------+------------------+-----+-----------+-------+  Left    Lt Pressure (mmHg)IndexWaveform   Comment  +---------+------------------+-----+-----------+-------+  Brachial 167                                         +---------+------------------+-----+-----------+-------+  PTA     255               1.53 multiphasic         +---------+------------------+-----+-----------+-------+  DP      255               1.53 multiphasic         +---------+------------------+-----+-----------+-------+  Great Toe109               0.65 Abnormal            +---------+------------------+-----+-----------+-------+   +-------+-----------+-----------+------------+------------+  ABI/TBIToday's ABIToday's TBIPrevious ABIPrevious TBI  +-------+-----------+-----------+------------+------------+  Right [place]         0.94       [place]          0.49          +-------+-----------+-----------+------------+------------+  Left  [place]         0.65       [place]          0.43          +-------+-----------+-----------+------------+------------+       Bilateral ABIs appear essentially unchanged compared to prior study on  09/05/22.    Summary:  Right: Resting right ankle-brachial index indicates noncompressible right  lower extremity arteries. The right toe-brachial index is normal.   Left: Resting left ankle-brachial index indicates noncompressible left  lower extremity arteries. The left toe-brachial index is abnormal.      Assessment/Plan:    77 year old female status post left lower extremity revascularization and now secondary to patient with podiatry.  Plan will be for follow-up in 6 months with repeat noninvasive studies.  All questions answered today in the presence of her daughter from [place].     Maeola Harman MD [facility]

## 2023-01-21 ENCOUNTER — Other Ambulatory Visit (HOSPITAL_COMMUNITY): Payer: Self-pay | Admitting: Emergency Medicine

## 2023-01-21 ENCOUNTER — Telehealth (HOSPITAL_COMMUNITY): Payer: Self-pay | Admitting: Emergency Medicine

## 2023-01-21 NOTE — Progress Notes (Signed)
Med rec for Mrs Carel.   Pt complained of a yeast infection, was able to speak with her doctor who called in Fluconazole. Pt was given one dose today and one was placed in her pill box for Wednesday as it is prescribed to take every other day.   Pt had no further questions about medication and advised to reach out if she did.   Will follow up in the home for our regular home visit on Thursday.   Benson Setting EMT-P Community Paramedic  [phone]

## 2023-01-21 NOTE — Telephone Encounter (Signed)
Received a VM that was left on Friday from [facility].  I followed up with Danielle [name] this morning and reported that she still does not have her humalog and reports high sugars as tracked by her Dexcom.   I followed up with [facility] [phone]) to check on the status of her prescription filled on 01/03/23. They advised an incomplete application through [employer] and advised follow up with them.  [employer] [phone]) advised that her application was missing despite attempts to reach Prescriber.   Will reach out to Endocrinologist regarding same.   Benson Setting EMT-P Community Paramedic  [phone]

## 2023-01-21 NOTE — Telephone Encounter (Signed)
Left message with Dr. Harvel Ricks clinical team regarding Danielle [name]'s [employer] incomplete application for her Humalog.   Pt has been without for weeks now and has been using her long acting insulin in the morning (25 units) and at night (40 units). Medication change was reported to be recommended by pharmacist.   Today, [place] advised that her sugar was 223.   Would appreciate guidance if you need any further to complete this application.   Thank you,  Benson Setting EMT-P Community Paramedic  [phone]

## 2023-01-22 ENCOUNTER — Ambulatory Visit (INDEPENDENT_AMBULATORY_CARE_PROVIDER_SITE_OTHER): Payer: Medicare HMO | Admitting: Podiatry

## 2023-01-22 ENCOUNTER — Telehealth: Payer: Self-pay

## 2023-01-22 ENCOUNTER — Telehealth (HOSPITAL_COMMUNITY): Payer: Self-pay

## 2023-01-22 ENCOUNTER — Encounter: Payer: Self-pay | Admitting: Podiatry

## 2023-01-22 ENCOUNTER — Other Ambulatory Visit: Payer: Self-pay

## 2023-01-22 DIAGNOSIS — Z9889 Other specified postprocedural states: Secondary | ICD-10-CM

## 2023-01-22 DIAGNOSIS — L97524 Non-pressure chronic ulcer of other part of left foot with necrosis of bone: Secondary | ICD-10-CM

## 2023-01-22 DIAGNOSIS — L84 Corns and callosities: Secondary | ICD-10-CM | POA: Diagnosis not present

## 2023-01-22 DIAGNOSIS — E1151 Type 2 diabetes mellitus with diabetic peripheral angiopathy without gangrene: Secondary | ICD-10-CM

## 2023-01-22 MED ORDER — NOVOLOG FLEXPEN 100 UNIT/ML ~~LOC~~ SOPN: PEN_INJECTOR | SUBCUTANEOUS | 0 refills | Status: AC

## 2023-01-22 MED ORDER — INSULIN LISPRO (1 UNIT DIAL) 100 UNIT/ML (KWIKPEN): PEN_INJECTOR | SUBCUTANEOUS | 0 refills | Status: DC

## 2023-01-22 NOTE — Telephone Encounter (Signed)
Phone call for Mrs. Danielle [name] to [employer] CMA-  you all sent in a prescription to cover her Humalog until the [place] application is complete however her insurance does not cover Humalog- [facility] is reporting her insurance only covers Novolog- can we get this sent in for her in replacement of the Humalog until the [place] shipment comes in?   CMA responded yes they will send in Novolog.   I will update [name] to update patient.   [name], EMT-Paramedic [phone] 01/22/2023

## 2023-01-22 NOTE — Progress Notes (Signed)
Subjective:  Patient ID: Danielle [name], female    DOB: [birth year],  MRN: [id number]  No chief complaint on file.   DOS: 01/01/23  Procedure: Left second toe amputation   77 y.o. female returns for POV#2. Relates doing well and managing with pain   Review of Systems: Negative except as noted in the HPI. Denies N/V/F/Ch.  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HCC) 02/05/2017   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 05/20/2018   Acute kidney failure (HCC) 05/15/2018   Arthritis    "all over" (05/22/2018)   Blood dyscrasia    per pt-has small blood cells-appears as if anemic   Breast cancer, left breast (HCC) 1985   CHF (congestive heart failure) (HCC)    Complication of anesthesia    difficult to awaken from per pt   Coronary artery disease    Dyspnea    Essential hypertension    Heart disease    Heart murmur    History of gout    Hypothyroidism    Obesity (BMI 30-39.9) 11/11/2017   OSA (obstructive sleep apnea) 02/18/2017    severe obstructive sleep apnea with an AHI of 75.6/h and mild central sleep apnea with a CAI of 7.7/h.  Oxygen saturations dropped as low as 82%.   He is on CPAP at 9 cm H2O.   OSA on CPAP    Personal history of chemotherapy    Personal history of radiation therapy    Presence of permanent cardiac pacemaker    Primary localized osteoarthritis of right knee 05/13/2018   Refusal of blood transfusions as patient is Jehovah's Witness    Type II diabetes mellitus (HCC)     Current Outpatient Medications:    acetaminophen (TYLENOL) 500 MG tablet, Take 500-1,000 mg by mouth every 6 (six) hours as needed (for pain or headaches)., Disp: , Rfl:    allopurinol (ZYLOPRIM) 100 MG tablet, Take 100 mg by mouth daily., Disp: , Rfl:    apixaban (ELIQUIS) 5 MG TABS tablet, Take 1 tablet (5 mg total) by mouth 2 (two) times daily., Disp: 60 tablet, Rfl: 11   aspirin EC 81 MG tablet, Take 1  tablet (81 mg total) by mouth daily. Swallow whole., Disp: 360 tablet, Rfl: 0   atorvastatin (LIPITOR) 80 MG tablet, Take 1 tablet (80 mg total) by mouth at bedtime., Disp: 90 tablet, Rfl: 3   B Complex-Biotin-FA (B-COMPLEX PO), Take by mouth., Disp: , Rfl:    Biotin 10 MG TABS, Take 10 mg by mouth daily., Disp: , Rfl:    Blood Glucose Monitoring Suppl (ONETOUCH VERIO) w/Device KIT, Use to check blood sugar, Disp: 1 kit, Rfl: 0   carvedilol (COREG) 3.125 MG tablet, Take 1 tablet (3.125 mg total) by mouth 2 (two) times daily with a meal., Disp: 180 tablet, Rfl: 3   Cinnamon 500 MG capsule, Take 500 mg by mouth daily., Disp: , Rfl:    clotrimazole-betamethasone (LOTRISONE) lotion, Apply 1 application topically 2 (two) times daily as needed (itching)., Disp: , Rfl:    Continuous Blood Gluc Sensor (DEXCOM G6 SENSOR) MISC, 1 Device by Does not apply route as directed., Disp: 9 each, Rfl: 3   doxycycline (VIBRA-TABS) 100 MG tablet, Take 1 tablet (100 mg total) by mouth 2 (two) times daily for 28 days., Disp: 56 tablet, Rfl: 0   empagliflozin (JARDIANCE) 10 MG TABS tablet, Take 1 tablet (10 mg total) by mouth daily., Disp:  30 tablet, Rfl: 11   ferrous sulfate 325 (65 FE) MG tablet, Take 325 mg by mouth 3 (three) times a week., Disp: , Rfl:    folic acid (FOLVITE) 1 MG tablet, Take 1 tablet (1 mg total) by mouth daily., Disp: 30 tablet, Rfl: 2   gabapentin (NEURONTIN) 300 MG capsule, Take 300 mg by mouth 2 (two) times daily., Disp: , Rfl:    glucose blood (ONETOUCH VERIO) test strip, Check blood sugar 2 times daily, Disp: 100 each, Rfl: 12   Insulin Glargine (BASAGLAR KWIKPEN) 100 UNIT/ML, Inject 40 Units into the skin at bedtime., Disp: 45 mL, Rfl: 2   insulin lispro (HUMALOG KWIKPEN) 100 UNIT/ML KwikPen, DIAL AND INJECT UNDER THE SKIN THREE TIMES DAILY PER CORRECTION SCALE. MAX DAILY DOSE 50 UNITS., Disp: 15 mL, Rfl: 0   Insulin Lispro (HUMALOG [place]), Inject 8 Units into the skin in the morning, at noon,  and at bedtime., Disp: , Rfl:    Insulin Pen Needle 32G X 4 MM MISC, 1 Device by Does not apply route in the morning, at noon, in the evening, and at bedtime., Disp: 400 each, Rfl: 3   Lancets (ONETOUCH DELICA PLUS LANCET33G) MISC, USE   TO CHECK GLUCOSE 4 TIMES DAILY, Disp: 100 each, Rfl: 3   latanoprost (XALATAN) 0.005 % ophthalmic solution, Place 1 drop into both eyes at bedtime., Disp: , Rfl:    levothyroxine (SYNTHROID, LEVOTHROID) 75 MCG tablet, Take 75 mcg by mouth daily before breakfast., Disp: , Rfl:    LUMIGAN 0.01 % SOLN, Place 1 drop into both eyes at bedtime., Disp: , Rfl:    Multiple Vitamins-Minerals (ALIVE ONCE DAILY WOMENS 50+ PO), Take 1 tablet by mouth daily., Disp: , Rfl:    ondansetron (ZOFRAN) 4 MG tablet, Take 1 tablet (4 mg total) by mouth every 8 (eight) hours as needed for nausea or vomiting., Disp: 20 tablet, Rfl: 0   spironolactone (ALDACTONE) 25 MG tablet, Take 0.5 tablets (12.5 mg total) by mouth daily., Disp: 45 tablet, Rfl: 3   torsemide (DEMADEX) 20 MG tablet, Take 2 tablets (40 mg total) by mouth daily., Disp: 90 tablet, Rfl: 3   Travoprost, BAK Free, (TRAVATAN) 0.004 % SOLN ophthalmic solution, Place 1 drop into both eyes at bedtime., Disp: , Rfl:   Social History   Tobacco Use  Smoking Status Former   Current packs/day: 0.00   Average packs/day: 1 pack/day for 22.0 years (22.0 ttl pk-yrs)   Types: Cigarettes   Start date: 53   Quit date: 6   Years since quitting: 41.6  Smokeless Tobacco Never    Allergies  Allergen Reactions   Codeine Other (See Comments)   Other Other (See Comments)    Blood Product Refusal (Jehovah's witness)     Tape Other (See Comments)    Leaves blisters and marks on the skin   Sulfa Antibiotics Rash   Uloric [Febuxostat] Rash   Objective:  There were no vitals filed for this visit. There is no height or weight on file to calculate BMI. Constitutional Well developed. Well nourished.  Vascular Foot warm and well  perfused. Capillary refill normal to all digits.   Neurologic Normal speech. Oriented to person, place, and time. Epicritic sensation to light touch grossly present bilaterally.  Dermatologic Skin healing well without signs of infection. Skin edges well coapted without signs of infection.  Orthopedic: Tenderness to palpation noted about the surgical site.   Radiographs: Interval removal of left second digit  Assessment:  1. Post-operative state   2. 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 Type II diabetes mellitus with peripheral circulatory disorder [facility])    Plan:  Patient was evaluated and treated and all questions answered.  S/p foot surgery left -Progressing as expected post-operatively. -WB Status: WBAT in surgical shoe -Sutures: removed today without incident.  -Medications: n/a -Foot redressed. -Will get scheduled for DM shoe fitting  Follow-up in 3 weeks for recheck.   No follow-ups on file.

## 2023-01-22 NOTE — Telephone Encounter (Signed)
I have spoke with patient and advise that a prescription has been sent into the pharmacy until patient assistance is approved and shipped.

## 2023-01-24 ENCOUNTER — Telehealth (HOSPITAL_COMMUNITY): Payer: Self-pay | Admitting: Emergency Medicine

## 2023-01-24 ENCOUNTER — Other Ambulatory Visit (HOSPITAL_COMMUNITY): Payer: Self-pay | Admitting: Emergency Medicine

## 2023-01-24 NOTE — Progress Notes (Signed)
Paramedicine Encounter    Patient ID: Danielle [name], female    DOB: 1945/12/12, 76 y.o.   MRN: [id number]   Complaints - none.   Assessment - +1 edema, swollen great toe - good cap refil and sensation. Lung sounds clear.    Compliance with meds - 5x Levothroxine doses, 1x am dose, and 3x eve doses , reports that she got confused with 2x weeks.   Pill box filled - for 1x week    Refills needed - ASA,  Torsemode  Meds changes since last visit - none    Social changes - none    VISIT SUMMARY**  Met with Danielle [name] in the home today. Pt was in a good mood, ambulating without exertion. Pt reported that she got the two pill boxes confused, thinking one was completed when it wasn't. Pt reported good healing of her recent amputation. Pt was assessed as noted to find no acute abnormalities. Pt had no further concerns. Pt's pill box was filled for 1x week. I reminded her of her appointment in the clinic on Tuesday and left her with a physical note for reference. Will call to remind Tuesday AM. Will follow up at the clinic with Pt on Tuesday.    BP 118/80   Pulse 74   Resp 16   Wt 199 lb (90.3 kg)   SpO2 97%   BMI 34.16 kg/m  Weight yesterday-DNW Last visit weight-205 kbs    B[place] Setting EMT-P Community Paramedic  [phone]     ACTION: Home visit completed     Patient Care Team: Renford Dills, MD as PCP - General (Internal Medicine) Regan Lemming, MD as PCP - Electrophysiology (Cardiology) Quintella Reichert, MD as PCP - Sleep Medicine (Cardiology) Bensimhon, Bevelyn Buckles, MD as PCP - Cardiology (Cardiology) Burna Sis, LCSW as Social Worker (Licensed Clinical Social Worker) Shamleffer, Konrad Dolores, MD as Consulting Physician (Endocrinology)  Patient Active Problem List   Diagnosis Date Noted   Chronic systolic heart failure (HCC) 05/08/2022   Type 2 diabetes mellitus with stage 3a chronic kidney disease, with long-term current use of insulin (HCC)  02/16/2022   Prosthetic joint implant failure, initial encounter (HCC) 12/20/2021   Pseudophakia of both eyes 11/15/2021   DVT (deep venous thrombosis) (HCC) 07/31/2021   Acute blood loss anemia    Septic Arthritis, Infection of prosthetic right knee joint (HCC), MSSA infection 07/10/2021   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) 07/10/2021   CAD (coronary artery disease) 06/08/2021   Carotid artery disease (HCC) 06/08/2021   Symptomatic bradycardia 06/08/2021   Paroxysmal atrial fibrillation (HCC) 06/08/2021   CKD (chronic kidney disease), stage III (HCC) 06/08/2021   Polyneuropathy associated with underlying disease (HCC) 05/04/2020   Severe nonproliferative diabetic retinopathy of left eye, with macular edema, associated with type 2 diabetes mellitus (HCC) 11/05/2019   Severe nonproliferative diabetic retinopathy of right eye, with macular edema, associated with type 2 diabetes mellitus (HCC) 11/05/2019   Retinal hemorrhage of right eye 11/05/2019   Retinal hemorrhage of left eye 11/05/2019   Diabetes mellitus (HCC) 05/14/2019   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, with long-term current use of insulin (HCC) 05/14/2019   Type 2 diabetes mellitus with diabetic polyneuropathy, with long-term current use of insulin (HCC) 05/14/2019   History of gout 05/20/2018   Primary localized osteoarthritis of right knee 05/13/2018   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) 12/20/2017   Obesity (BMI 30-39.9) 11/11/2017   OSA (obstructive sleep apnea)  02/18/2017   Essential hypertension    L Breast Cancer    Hypothyroidism     Current Outpatient Medications:    acetaminophen (TYLENOL) 500 MG tablet, Take 500-1,000 mg by mouth every 6 (six) hours as needed (for pain or headaches)., Disp: , Rfl:    allopurinol (ZYLOPRIM) 100 MG tablet, Take 100 mg by mouth daily., Disp: , Rfl:    apixaban (ELIQUIS) 5 MG TABS tablet, Take 1 tablet (5 mg total) by mouth 2 (two)  times daily., Disp: 60 tablet, Rfl: 11   aspirin EC 81 MG tablet, Take 1 tablet (81 mg total) by mouth daily. Swallow whole., Disp: 360 tablet, Rfl: 0   atorvastatin (LIPITOR) 80 MG tablet, Take 1 tablet (80 mg total) by mouth at bedtime., Disp: 90 tablet, Rfl: 3   B Complex-Biotin-FA (B-COMPLEX PO), Take by mouth., Disp: , Rfl:    Biotin 10 MG TABS, Take 10 mg by mouth daily., Disp: , Rfl:    Blood Glucose Monitoring Suppl (ONETOUCH VERIO) w/Device KIT, Use to check blood sugar, Disp: 1 kit, Rfl: 0   carvedilol (COREG) 3.125 MG tablet, Take 1 tablet (3.125 mg total) by mouth 2 (two) times daily with a meal., Disp: 180 tablet, Rfl: 3   Cinnamon 500 MG capsule, Take 500 mg by mouth daily., Disp: , Rfl:    Continuous Blood Gluc Sensor (DEXCOM G6 SENSOR) MISC, 1 Device by Does not apply route as directed., Disp: 9 each, Rfl: 3   empagliflozin (JARDIANCE) 10 MG TABS tablet, Take 1 tablet (10 mg total) by mouth daily., Disp: 30 tablet, Rfl: 11   ferrous sulfate 325 (65 FE) MG tablet, Take 325 mg by mouth 3 (three) times a week., Disp: , Rfl:    folic acid (FOLVITE) 1 MG tablet, Take 1 tablet (1 mg total) by mouth daily., Disp: 30 tablet, Rfl: 2   gabapentin (NEURONTIN) 300 MG capsule, Take 300 mg by mouth 2 (two) times daily., Disp: , Rfl:    glucose blood (ONETOUCH VERIO) test strip, Check blood sugar 2 times daily, Disp: 100 each, Rfl: 12   insulin aspart (NOVOLOG FLEXPEN) 100 UNIT/ML FlexPen, DIAL AND INJECT UNDER THE SKIN THREE TIMES DAILY PER CORRECTION SCALE. MAX DAILY DOSE 50 UNITS, Disp: 15 mL, Rfl: 0   Insulin Glargine (BASAGLAR KWIKPEN) 100 UNIT/ML, Inject 40 Units into the skin at bedtime., Disp: 45 mL, Rfl: 2   insulin lispro (HUMALOG KWIKPEN) 100 UNIT/ML KwikPen, DIAL AND INJECT UNDER THE SKIN THREE TIMES DAILY PER CORRECTION SCALE. MAX DAILY DOSE 50 UNITS., Disp: 15 mL, Rfl: 0   Insulin Lispro (HUMALOG [place]), Inject 8 Units into the skin in the morning, at noon, and at bedtime., Disp: , Rfl:     Insulin Pen Needle 32G X 4 MM MISC, 1 Device by Does not apply route in the morning, at noon, in the evening, and at bedtime., Disp: 400 each, Rfl: 3   Lancets (ONETOUCH DELICA PLUS LANCET33G) MISC, USE   TO CHECK GLUCOSE 4 TIMES DAILY, Disp: 100 each, Rfl: 3   latanoprost (XALATAN) 0.005 % ophthalmic solution, Place 1 drop into both eyes at bedtime., Disp: , Rfl:    levothyroxine (SYNTHROID, LEVOTHROID) 75 MCG tablet, Take 75 mcg by mouth daily before breakfast., Disp: , Rfl:    LUMIGAN 0.01 % SOLN, Place 1 drop into both eyes at bedtime., Disp: , Rfl:    Multiple Vitamins-Minerals (ALIVE ONCE DAILY WOMENS 50+ PO), Take 1 tablet by mouth daily., Disp: , Rfl:  ondansetron (ZOFRAN) 4 MG tablet, Take 1 tablet (4 mg total) by mouth every 8 (eight) hours as needed for nausea or vomiting., Disp: 20 tablet, Rfl: 0   spironolactone (ALDACTONE) 25 MG tablet, Take 0.5 tablets (12.5 mg total) by mouth daily., Disp: 45 tablet, Rfl: 3   torsemide (DEMADEX) 20 MG tablet, Take 2 tablets (40 mg total) by mouth daily., Disp: 90 tablet, Rfl: 3   Travoprost, BAK Free, (TRAVATAN) 0.004 % SOLN ophthalmic solution, Place 1 drop into both eyes at bedtime., Disp: , Rfl:    clotrimazole-betamethasone (LOTRISONE) lotion, Apply 1 application topically 2 (two) times daily as needed (itching)., Disp: , Rfl:  Allergies  Allergen Reactions   Codeine Other (See Comments)   Other Other (See Comments)    Blood Product Refusal (Jehovah's witness)     Tape Other (See Comments)    Leaves blisters and marks on the skin   Sulfa Antibiotics Rash   Uloric [Febuxostat] Rash     Social History   Socioeconomic History   Marital status: Widowed    Spouse name: Not on file   Number of children: 3   Years of education: Not on file   Highest education level: Some college, no degree  Occupational History   Occupation: retired  Tobacco Use   Smoking status: Former    Current packs/day: 0.00    Average packs/day: 1  pack/day for 22.0 years (22.0 ttl pk-yrs)    Types: Cigarettes    Start date: 50    Quit date: 49    Years since quitting: 41.6   Smokeless tobacco: Never  Vaping Use   Vaping status: Never Used  Substance and Sexual Activity   Alcohol use: Yes    Comment: 05/22/2018 "glass of wine couple times/wk"   Drug use: Not Currently   Sexual activity: Not Currently    Birth control/protection: None, Post-menopausal  Other Topics Concern   Not on file  Social History Narrative   Not on file   Social Determinants of Health   Financial Resource Strain: Medium Risk (12/22/2021)   Overall Financial Resource Strain (CARDIA)    Difficulty of Paying Living Expenses: Somewhat hard  Food Insecurity: No Food Insecurity (05/08/2018)   Hunger Vital Sign    Worried About Running Out of Food in the Last Year: Never true    Ran Out of Food in the Last Year: Never true  Transportation Needs: Unmet Transportation Needs (05/08/2022)   PRAPARE - Administrator, Civil Service (Medical): Yes    Lack of Transportation (Non-Medical): Yes  Physical Activity: Not on file  Stress: Not on file  Social Connections: Not on file  Intimate Partner Violence: Not on file    Physical Exam      Future Appointments  Date Time Provider Department Center  01/29/2023  2:30 PM [facility] PA/NP [facility] None  02/05/2023 11:15 AM Louann Sjogren, DPM [facility]  02/18/2023  1:45 PM Louann Sjogren, DPM [facility]  02/22/2023 11:30 AM Shamleffer, Konrad Dolores, MD [facility] None  03/08/2023  7:05 AM [facility] DEVICE REMOTES [facility]  06/07/2023  7:05 AM [facility] DEVICE REMOTES [facility]  07/17/2023 10:00 AM [facility] VASC 6 [facility]  07/17/2023 10:30 AM [facility] PA [facility]  09/06/2023  7:05 AM [facility] DEVICE REMOTES [facility]  12/06/2023  7:05 AM [facility] DEVICE REMOTES [facility]  03/06/2024  7:05 AM [facility]  DEVICE REMOTES [facility]  06/05/2024  7:05 AM [facility] DEVICE REMOTES [facility]  09/04/2024  7:05 AM [facility] DEVICE REMOTES [facility]  12/04/2024  7:05 AM [facility] DEVICE REMOTES [facility]  03/05/2025  7:05 AM [facility] DEVICE REMOTES [facility]  06/04/2025  7:05 AM [facility] DEVICE REMOTES [facility]  09/03/2025  7:05 AM [facility] DEVICE REMOTES [facility]  12/03/2025  7:05 AM [facility] DEVICE REMOTES [facility]

## 2023-01-25 NOTE — Progress Notes (Signed)
[facility]   PCP: Renford Dills, MD EP: Dr. Elberta Fortis Rheumatology: Dr. Lennette Bihari HF Cardiologist: Dr. Gala Romney  HPI: Danielle [name] is a 77 y.o.female with a past medical history of systolic HF (EF 45-50%), CAD s/p multiple stents in [place] (with h/o LAD stent thrombosis), ischemic cardiomyopathy (EF 10% at one point), symptomatic bradycardia s/p PPM, HTN, DM, hypothyroidism, and OSA.   Admitted 9/18 with SOB, acute on chronic systolic CHF. Diuresed with IV lasix (12 pounds), discharge weight was 215 pounds.    Had R TKR 12/19.    Admitted 11/22 with right knee septic arthritis. Arthrocentesis culture grew staph aureus.  Underwent I&D and polyexchange of infected right total knee arthroplasty.  Treated with 6 weeks IV antibiotics followed by suppressive abx. Aldactone held, losartan reduced to 25 mg daily and coreg reduced to 12.5 mg BID d/t soft BP. She was given 1 dose of furosemide on admission due to leg edema but this was held later due to AKI.  Torsemide was resumed at discharge.  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  Patient underwent explant right total knee arthroplasty with implantation of articulating antibiotic spacer by Dr. Blanchie Dessert on 07/12/2021. Placed on IV abx. Was discharged to SNF. During hospitalization was very anemic, but improving.   Echo 2/23 EF 40-45%   Follow up 4/23, unsure what meds she was taking. Re-enrolled in paramedicine.   Had knee re-replaced 12/20/21  Echo 05/08/22 showed EF 45-50%, mildly decreased LV with moderate LVH, grade II DD, RV ok, mild MR with severe MAC.  Today she returns for HF follow up with [name] - HF Paramedic.  Taking torsemide 40 daily. Weight was up to 216 but now down to 204. No CP. Gets around with a walker. Breathing is "good". Walking for 30 mins daily. + Ankle edema. No orthopnea or PND. Compliant with meds. Last A1c 11.4. Follows with Endo.    Cardiac  Studies: Echo (9/18): EF 45-50% Grade 2DD Left Atrium Severely dilated.  Echo (8/19): EF 45-50%, G2DD, LA mildly dilated, trileaflet moderately thick aortic valve Echo (12/21): EF 45-50% G2DD  Echo (2/23): EF 40-45%  Echo (12/23): EF 45-50%  ROS: All systems negative except as listed in HPI, PMH and Problem List.  SH:  Social History   Socioeconomic History   Marital status: Widowed    Spouse name: Not on file   Number of children: 3   Years of education: Not on file   Highest education level: Some college, no degree  Occupational History   Occupation: retired  Tobacco Use   Smoking status: Former    Current packs/day: 0.00    Average packs/day: 1 pack/day for 22.0 years (22.0 ttl pk-yrs)    Types: Cigarettes    Start date: 1    Quit date: 1983    Years since quitting: 41.6   Smokeless tobacco: Never  Vaping Use   Vaping status: Never Used  Substance and Sexual Activity   Alcohol use: Yes    Comment: 05/22/2018 "glass of wine couple times/wk"   Drug use: Not Currently   Sexual activity: Not Currently    Birth control/protection: None, Post-menopausal  Other Topics Concern   Not on file  Social History Narrative   Not on file   Social Determinants of Health   Financial Resource Strain: Medium Risk (12/22/2021)   Overall Financial Resource Strain (CARDIA)    Difficulty of Paying Living Expenses: Somewhat hard  Food Insecurity: No Food Insecurity (  05/08/2018)   Hunger Vital Sign    Worried About Running Out of Food in the Last Year: Never true    Ran Out of Food in the Last Year: Never true  Transportation Needs: Unmet Transportation Needs (05/08/2022)   PRAPARE - Administrator, Civil Service (Medical): Yes    Lack of Transportation (Non-Medical): Yes  Physical Activity: Not on file  Stress: Not on file  Social Connections: Not on file  Intimate Partner Violence: Not on file    FH:  Family History  Problem Relation Age of Onset   Multiple  myeloma Mother    Heart disease Father    Other Sister    Heart disease Brother    Diabetes Sister    Other Sister     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HCC) 02/05/2017   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 05/20/2018   Acute kidney failure (HCC) 05/15/2018   Arthritis    "all over" (05/22/2018)   Blood dyscrasia    per pt-has small blood cells-appears as if anemic   Breast cancer, left breast (HCC) 1985   CHF (congestive heart failure) (HCC)    Complication of anesthesia    difficult to awaken from per pt   Coronary artery disease    Dyspnea    Essential hypertension    Heart disease    Heart murmur    History of gout    Hypothyroidism    Obesity (BMI 30-39.9) 11/11/2017   OSA (obstructive sleep apnea) 02/18/2017    severe obstructive sleep apnea with an AHI of 75.6/h and mild central sleep apnea with a CAI of 7.7/h.  Oxygen saturations dropped as low as 82%.   He is on CPAP at 9 cm H2O.   OSA on CPAP    Personal history of chemotherapy    Personal history of radiation therapy    Presence of permanent cardiac pacemaker    Primary localized osteoarthritis of right knee 05/13/2018   Refusal of blood transfusions as patient is Jehovah's Witness    Type II diabetes mellitus (HCC)    Current Outpatient Medications  Medication Sig Dispense Refill   acetaminophen (TYLENOL) 500 MG tablet Take 500-1,000 mg by mouth every 6 (six) hours as needed (for pain or headaches).     allopurinol (ZYLOPRIM) 100 MG tablet Take 100 mg by mouth daily.     apixaban (ELIQUIS) 5 MG TABS tablet Take 1 tablet (5 mg total) by mouth 2 (two) times daily. 60 tablet 11   aspirin EC 81 MG tablet Take 1 tablet (81 mg total) by mouth daily. Swallow whole. 360 tablet 0   atorvastatin (LIPITOR) 80 MG tablet Take 1 tablet (80 mg total) by mouth at bedtime. 90 tablet 3   B Complex-Biotin-FA (B-COMPLEX PO) Take by mouth.      Biotin 10 MG TABS Take 10 mg by mouth daily.     Blood Glucose Monitoring Suppl (ONETOUCH VERIO) w/Device KIT Use to check blood sugar 1 kit 0   carvedilol (COREG) 3.125 MG tablet Take 1 tablet (3.125 mg total) by mouth 2 (two) times daily with a meal. 180 tablet 3   Cinnamon 500 MG capsule Take 500 mg by mouth daily.     clotrimazole-betamethasone (LOTRISONE) lotion Apply 1 application topically 2 (two) times daily as needed (itching).     Continuous Blood Gluc Sensor (DEXCOM G6 SENSOR) MISC 1 Device by  Does not apply route as directed. 9 each 3   empagliflozin (JARDIANCE) 10 MG TABS tablet Take 1 tablet (10 mg total) by mouth daily. 30 tablet 11   ferrous sulfate 325 (65 FE) MG tablet Take 325 mg by mouth 3 (three) times a week.     folic acid (FOLVITE) 1 MG tablet Take 1 tablet (1 mg total) by mouth daily. 30 tablet 2   gabapentin (NEURONTIN) 300 MG capsule Take 300 mg by mouth 2 (two) times daily.     glucose blood (ONETOUCH VERIO) test strip Check blood sugar 2 times daily 100 each 12   insulin aspart (NOVOLOG FLEXPEN) 100 UNIT/ML FlexPen DIAL AND INJECT UNDER THE SKIN THREE TIMES DAILY PER CORRECTION SCALE. MAX DAILY DOSE 50 UNITS 15 mL 0   Insulin Glargine (BASAGLAR KWIKPEN) 100 UNIT/ML Inject 40 Units into the skin at bedtime. 45 mL 2   insulin lispro (HUMALOG KWIKPEN) 100 UNIT/ML KwikPen DIAL AND INJECT UNDER THE SKIN THREE TIMES DAILY PER CORRECTION SCALE. MAX DAILY DOSE 50 UNITS. 15 mL 0   Insulin Lispro (HUMALOG [place]) Inject 8 Units into the skin in the morning, at noon, and at bedtime.     Insulin Pen Needle 32G X 4 MM MISC 1 Device by Does not apply route in the morning, at noon, in the evening, and at bedtime. 400 each 3   Lancets (ONETOUCH DELICA PLUS LANCET33G) MISC USE   TO CHECK GLUCOSE 4 TIMES DAILY 100 each 3   latanoprost (XALATAN) 0.005 % ophthalmic solution Place 1 drop into both eyes at bedtime.     levothyroxine (SYNTHROID, LEVOTHROID) 75 MCG tablet Take 75 mcg by mouth  daily before breakfast.     LUMIGAN 0.01 % SOLN Place 1 drop into both eyes at bedtime.     Multiple Vitamins-Minerals (ALIVE ONCE DAILY WOMENS 50+ PO) Take 1 tablet by mouth daily.     ondansetron (ZOFRAN) 4 MG tablet Take 1 tablet (4 mg total) by mouth every 8 (eight) hours as needed for nausea or vomiting. 20 tablet 0   spironolactone (ALDACTONE) 25 MG tablet Take 0.5 tablets (12.5 mg total) by mouth daily. 45 tablet 3   torsemide (DEMADEX) 20 MG tablet Take 2 tablets (40 mg total) by mouth daily. 90 tablet 3   Travoprost, BAK Free, (TRAVATAN) 0.004 % SOLN ophthalmic solution Place 1 drop into both eyes at bedtime.     No current facility-administered medications for this visit.   There were no vitals taken for this visit.  Wt Readings from Last 3 Encounters:  01/24/23 90.3 kg (199 lb)  01/16/23 90.7 kg (200 lb)  01/10/23 93 kg (205 lb)   PHYSICAL EXAM: General:  Well appearing. No resp difficulty HEENT: normal Neck: supple. no JVD. Carotids 2+ bilat; no bruits. No lymphadenopathy or thryomegaly appreciated. Cor: PMI nondisplaced. Regular rate & rhythm. No rubs, gallops or murmurs. Lungs: clear Abdomen: obesesoft, nontender, nondistended. No hepatosplenomegaly. No bruits or masses. Good bowel sounds. Extremities: no cyanosis, clubbing, rash, 1-2+ ankle edema Neuro: alert & orientedx3, cranial nerves grossly intact. moves all 4 extremities w/o difficulty. Affect pleasant  ReDS 38%  ASSESSMENT & PLAN: 1. Chronic systolic CHF: ICM - Echo (12/21): EF 45-50% G2DD  - Echo (2/23): EF 40-45%  - Echo 05/08/22 with EF 45-50%, moderate LVH grade II DD, RV normal, severe MAC. We discussed results today. - Improved NYHA II. Overall volume status looks good but still with some pedal edema. Encouraged her to wear support stockings.  Will not push diuretics currently - ReDS 38% - Continue torsemide 40 mg daily.  - Continue spiro 12.5 mg daily. - Continue losartan 25 mg daily.  - Continue  carvedilol 3.125 mg bid. - Continue Jardiance 10 mg daily. - Continue Paramedicine support  2. CAD:  - Stenting in 2010, 2012 and 2016.  - No s/s angina - Continue statin.  - No ASA with Eliquis.   3. Paroxysmal Afib - In NSR - Continue Eliquis 5 mg bid.  -  (She is Jehovah's Witness).   4. HTN - Blood pressure well controlled. Continue current regimen.   5. DM - Per Endocrine.  - On insulin. - Continue Jardiance. Could increase jardiance if Endocrinology  recommends.  -No GU symptoms.   6. H/o sinus pauses s/p PPM - Dr. Elberta Fortis now following.  - No change.    7. OSA - Continue CPAP nightly.  - Follows with Dr. Mayford Knife.   8. CKD 3b - Baseline SCr ~ 1.5-2.0 - Last Scr 1.87 on 09/25/22 - Continue Jardiance.   9. 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

## 2023-01-29 ENCOUNTER — Other Ambulatory Visit (HOSPITAL_COMMUNITY): Payer: Self-pay | Admitting: Internal Medicine

## 2023-01-29 ENCOUNTER — Telehealth (HOSPITAL_COMMUNITY): Payer: Self-pay

## 2023-01-29 ENCOUNTER — Encounter (HOSPITAL_COMMUNITY): Payer: Self-pay

## 2023-01-29 ENCOUNTER — Ambulatory Visit (HOSPITAL_COMMUNITY)
Admission: RE | Admit: 2023-01-29 | Discharge: 2023-01-29 | Disposition: A | Discharge status: Home / Self Care | Payer: Medicare HMO | Source: Ambulatory Visit | Attending: Family Medicine | Admitting: Family Medicine

## 2023-01-29 ENCOUNTER — Other Ambulatory Visit (HOSPITAL_COMMUNITY): Payer: Self-pay | Admitting: Emergency Medicine

## 2023-01-29 VITALS — BP 114/56 | HR 75 | Wt 202.2 lb

## 2023-01-29 DIAGNOSIS — G4733 Obstructive sleep apnea (adult) (pediatric): Secondary | ICD-10-CM | POA: Diagnosis not present

## 2023-01-29 DIAGNOSIS — I480 Paroxysmal atrial fibrillation: Secondary | ICD-10-CM | POA: Diagnosis not present

## 2023-01-29 DIAGNOSIS — Z96651 Presence of right artificial knee joint: Secondary | ICD-10-CM | POA: Diagnosis not present

## 2023-01-29 DIAGNOSIS — Z87891 Personal history of nicotine dependence: Secondary | ICD-10-CM | POA: Diagnosis not present

## 2023-01-29 DIAGNOSIS — Z89422 Acquired absence of other left toe(s): Secondary | ICD-10-CM | POA: Insufficient documentation

## 2023-01-29 DIAGNOSIS — I5022 Chronic systolic (congestive) heart failure: Secondary | ICD-10-CM | POA: Diagnosis not present

## 2023-01-29 DIAGNOSIS — I495 Sick sinus syndrome: Secondary | ICD-10-CM | POA: Diagnosis not present

## 2023-01-29 DIAGNOSIS — Z794 Long term (current) use of insulin: Secondary | ICD-10-CM

## 2023-01-29 DIAGNOSIS — N1832 Chronic kidney disease, stage 3b: Secondary | ICD-10-CM | POA: Diagnosis not present

## 2023-01-29 DIAGNOSIS — I2510 Atherosclerotic heart disease of native coronary artery without angina pectoris: Secondary | ICD-10-CM | POA: Diagnosis not present

## 2023-01-29 DIAGNOSIS — E1122 Type 2 diabetes mellitus with diabetic chronic kidney disease: Secondary | ICD-10-CM | POA: Insufficient documentation

## 2023-01-29 DIAGNOSIS — N183 Chronic kidney disease, stage 3 unspecified: Secondary | ICD-10-CM

## 2023-01-29 DIAGNOSIS — I130 Hypertensive heart and chronic kidney disease with heart failure and stage 1 through stage 4 chronic kidney disease, or unspecified chronic kidney disease: Secondary | ICD-10-CM | POA: Diagnosis not present

## 2023-01-29 DIAGNOSIS — Z955 Presence of coronary angioplasty implant and graft: Secondary | ICD-10-CM | POA: Insufficient documentation

## 2023-01-29 DIAGNOSIS — I252 Old myocardial infarction: Secondary | ICD-10-CM | POA: Diagnosis not present

## 2023-01-29 DIAGNOSIS — B3731 Acute candidiasis of vulva and vagina: Secondary | ICD-10-CM | POA: Insufficient documentation

## 2023-01-29 DIAGNOSIS — I10 Essential (primary) hypertension: Secondary | ICD-10-CM

## 2023-01-29 LAB — CBC
HCT: 36.4 % (ref 36.0–46.0)
Hemoglobin: 11.1 g/dL — ABNORMAL LOW (ref 12.0–15.0)
MCH: 20.5 pg — ABNORMAL LOW (ref 26.0–34.0)
MCHC: 30.5 g/dL (ref 30.0–36.0)
MCV: 67.3 fL — ABNORMAL LOW (ref 80.0–100.0)
Platelets: 208 10*3/uL (ref 150–400)
RBC: 5.41 MIL/uL — ABNORMAL HIGH (ref 3.87–5.11)
RDW: 17.1 % — ABNORMAL HIGH (ref 11.5–15.5)
WBC: 7.9 10*3/uL (ref 4.0–10.5)
nRBC: 0 % (ref 0.0–0.2)

## 2023-01-29 LAB — BASIC METABOLIC PANEL
Anion gap: 12 (ref 5–15)
BUN: 35 mg/dL — ABNORMAL HIGH (ref 8–23)
CO2: 29 mmol/L (ref 22–32)
Calcium: 9.6 mg/dL (ref 8.9–10.3)
Chloride: 96 mmol/L — ABNORMAL LOW (ref 98–111)
Creatinine, Ser: 1.6 mg/dL — ABNORMAL HIGH (ref 0.44–1.00)
GFR, Estimated: 33 mL/min — ABNORMAL LOW (ref >60)
Glucose, Bld: 192 mg/dL — ABNORMAL HIGH (ref 70–99)
Potassium: 4.3 mmol/L (ref 3.5–5.1)
Sodium: 137 mmol/L (ref 135–145)

## 2023-01-29 LAB — BRAIN NATRIURETIC PEPTIDE: B Natriuretic Peptide: 120.1 pg/mL — ABNORMAL HIGH (ref 0.0–100.0)

## 2023-01-29 MED ORDER — TORSEMIDE 20 MG PO TABS: 40.0000 mg | ORAL_TABLET | Freq: Every day | ORAL | 11 refills | Status: DC

## 2023-01-29 MED ORDER — SPIRONOLACTONE 25 MG PO TABS: 25.0000 mg | ORAL_TABLET | Freq: Every day | ORAL | 3 refills | Status: DC

## 2023-01-29 MED ORDER — FLUCONAZOLE 150 MG PO TABS: 150.0000 mg | ORAL_TABLET | Freq: Once | ORAL | 0 refills | Status: AC

## 2023-01-29 NOTE — Telephone Encounter (Signed)
Spoke with Mrs. Arbutus Ped and she expressed concern with her teeth and eyes had requested a dental and opthalmology referral.   Will call [facility] to express this request.  Benson Setting EMT-P Community Paramedic  [phone]

## 2023-01-29 NOTE — Addendum Note (Signed)
Addended by: Aijah Lattner, Milagros Reap on: 01/29/2023 03:14 PM   Modules accepted: Orders

## 2023-01-29 NOTE — Patient Instructions (Signed)
Medication Changes:  INCREASE SPIRONOLACTONE TO 25MG  DAILY   FLUCONAZOLE 150MG  FOR ONE DOSE  Lab Work:  Labs done today, your results will be available in MyChart, we will contact you for abnormal readings.  THEN RETURN IN 1 WEEK FOR REPEAT LABS  Follow-Up in: 3 MONTHS WITH DR. Gala Romney AS SCHEDULED WITH ECHOCARDIOGRAM  At the [facility], you and your health needs are our priority. We have a designated team specialized in the treatment of Heart Failure. This Care Team includes your primary Heart Failure Specialized Cardiologist (physician), Advanced Practice Providers (APPs- Physician Assistants and Nurse Practitioners), and Pharmacist who all work together to provide you with the care you need, when you need it.   You may see any of the following providers on your designated Care Team at your next follow up:  Dr. Arvilla Meres Dr. Marca Ancona Dr. Marcos Eke, NP Robbie Lis, [place] [facility] [place], [place] Brynda Peon, NP Karle Plumber, PharmD   Please be sure to bring in all your medications bottles to every appointment.   Need to Contact us:  If you have any questions or concerns before your next appointment please send [place] a message through [place] or call our office at [phone].    TO LEAVE A MESSAGE FOR THE NURSE SELECT OPTION 2, PLEASE LEAVE A MESSAGE INCLUDING: YOUR NAME DATE OF BIRTH CALL BACK NUMBER REASON FOR CALL**this is important as we prioritize the call backs  YOU WILL RECEIVE A CALL BACK THE SAME DAY AS LONG AS YOU CALL BEFORE 4:00 PM

## 2023-01-29 NOTE — Telephone Encounter (Signed)
I was able assist Danielle [name] in getting her dentist and eye doctor the following:   [facility]  Thursday Sept 5th at 1:00  [address]  [phone]   [facility]  Original date set up for April 9th at 1:45 but they called Jennilyn after I spoke to them and they gave her an earlier appointment for February- they will be mailing her a new patient apt. packet with apt reminder.  [address]  [phone]   Georgianne and I spoke and she is aware of same.   [name], EMT-Paramedic [phone] 01/29/2023

## 2023-01-29 NOTE — Progress Notes (Signed)
Paramedicine Encounter   Patient ID: Traniece Bernd , female,   DOB: February 07, 1946,77 y.o.,  MRN: [id number]   Met Mrs. Arbutus Ped in clinic today with Shanda Bumps, NP. Pt reported continued increased yeast production, but no itch. Pt denied any shortness of breath, dizziness, or chest pains. Pt was assessed to note some fluid noted at her lower extremities and abdomen and confirmed by her REDS. Shanda Bumps advised pt to increase her Spironolactone to a whole pill of 25 mg daily, medication change reflected in her pill box. Will review labs and follow up with recommendations. Pill box filled for 1x week. Will follow up for a med rec/check up on Thursday. Pt was reminded of her upcoming dental appointment at [facility] on Thursday, September 5th at 1pm and was given a paper as a visual reference of same. Pt was reminded to make transportation arrangements for same.   Weight @ clinic - 202 lbs  B/P-114/56 P- 78 [id number] Reds - 38%  Med changes- increased spironolactone from 12.5 mg to 25 mg daily.  Benson Setting EMT-P Community Paramedic  [phone]

## 2023-01-29 NOTE — Telephone Encounter (Signed)
Refills returned to pharmacy

## 2023-01-29 NOTE — Telephone Encounter (Signed)
Mrs. Sacchetti torsemide called in for refill to [facility]- they are needing refills for same sent to their pharmacy. I will send to triage for same and make Sarah aware.   [name], EMT-Paramedic [phone] 01/29/2023

## 2023-01-29 NOTE — Progress Notes (Signed)
ReDS Vest / Clip - 01/29/23 1400       ReDS Vest / Clip   Station Marker A    Ruler Value 30    ReDS Value Range Moderate volume overload    ReDS Actual Value 38

## 2023-01-30 ENCOUNTER — Telehealth (HOSPITAL_COMMUNITY): Payer: Self-pay | Admitting: Emergency Medicine

## 2023-01-30 ENCOUNTER — Telehealth (HOSPITAL_COMMUNITY): Payer: Self-pay

## 2023-01-30 NOTE — Telephone Encounter (Signed)
Called in refills for Danielle [name] for the following:  -Torsemide -Spironolactone -These will be ready for pickup tomorrow [facility].    Eliquis- spoke to [facility] Last shipment on July 15th.   Next shipment will be Oct. 10th.   Patient should have enough bottles in the home- will have [name] confirm in the home and if not we will provide samples in the meantime.    [name], EMT-Paramedic [phone] 01/30/2023

## 2023-01-30 NOTE — Telephone Encounter (Signed)
Pt was contacted based off the results of her labs from yesterday's clinic appointment and advised to continue her medications as placed in her pill box.   Pt was relieved and understood. Will follow up in the home for a med rec tomorrow.   Benson Setting EMT-P Community Paramedic  [phone]

## 2023-01-31 ENCOUNTER — Other Ambulatory Visit (HOSPITAL_COMMUNITY): Payer: Self-pay | Admitting: Emergency Medicine

## 2023-01-31 ENCOUNTER — Telehealth (HOSPITAL_COMMUNITY): Payer: Self-pay | Admitting: Emergency Medicine

## 2023-01-31 NOTE — Progress Notes (Signed)
Med Rec for Mrs. Dimoff to fill her pill box to get back her regular Thursday schedule. Will follow up next week.    Refills needed: ASA  Pt did report that she has not been taking her ferrous sulfate for the past 4x weeks+ . Pt  advised that they were constipating her. Pt was advised to bring this up to her physicians instead of throwing the pills away. Pt was educated on the importance of iron and her most recent lab values. Pt was also educated on OTC resources for constipation. Pt advised that she would try them again.   Benson Setting EMT-P Community Paramedic  [phone]

## 2023-01-31 NOTE — Telephone Encounter (Signed)
Danielle [name] called me directly to ask if I would share her medication list with her new dentist. Medications shared with permission of patient.   Benson Setting EMT-P Community Paramedic  [phone]

## 2023-02-05 ENCOUNTER — Ambulatory Visit (INDEPENDENT_AMBULATORY_CARE_PROVIDER_SITE_OTHER): Payer: Medicare HMO | Admitting: Podiatry

## 2023-02-05 ENCOUNTER — Other Ambulatory Visit: Payer: Self-pay

## 2023-02-05 ENCOUNTER — Other Ambulatory Visit (HOSPITAL_COMMUNITY): Payer: Medicare HMO

## 2023-02-05 DIAGNOSIS — Z91199 Patient's noncompliance with other medical treatment and regimen due to unspecified reason: Secondary | ICD-10-CM

## 2023-02-05 DIAGNOSIS — I739 Peripheral vascular disease, unspecified: Secondary | ICD-10-CM

## 2023-02-05 NOTE — Progress Notes (Signed)
No show

## 2023-02-07 ENCOUNTER — Other Ambulatory Visit (HOSPITAL_COMMUNITY): Payer: Self-pay | Admitting: Emergency Medicine

## 2023-02-07 NOTE — Progress Notes (Signed)
Paramedicine Encounter    Patient ID: Danielle [name], female    DOB: 1946-03-28, 77 y.o.   MRN: [id number]   Complaints - none  Assessment - +3 edema, lung sounds clear   Compliance with meds - missed 1x Iron pill (reports that they have been giving her constipation  Pill box filled - for 1x week   Refills needed - Jardiance, ASA, folic acid  Meds changes since last visit - whole spirnolactone    Social changes preparing for brother and sister- in -law to move in    VISIT SUMMARY** Met with Mrs. Tawater in the home. Pt denied any complaints. Reported improvement with yeast infection symptoms and reported great healing from her recent amputation. Pt denied shortness of breath, chest pain, palpitations, or dizziness. Pt did exhibit some increased pedal edema and pt was educated on wearing compression socks, pt advised that she would try. Pt's pill box was filled for 1x week. Appointments reviewed and written down for easy reference. Will follow up in the home in 1x week.   BP 108/68   Pulse 74   Resp 16   Wt 199 lb (90.3 kg)   SpO2 94%   BMI 34.16 kg/m  Weight yesterdsy-199lbs Last visit weight-202lbs CBG: 437  Benson Setting EMT-P Community Paramedic  [phone]    ACTION: Home visit completed     Patient Care Team: Renford Dills, MD as PCP - General (Internal Medicine) Regan Lemming, MD as PCP - Electrophysiology (Cardiology) Quintella Reichert, MD as PCP - Sleep Medicine (Cardiology) Bensimhon, Bevelyn Buckles, MD as PCP - Cardiology (Cardiology) Burna Sis, LCSW as Social Worker (Licensed Clinical Social Worker) Shamleffer, Konrad Dolores, MD as Consulting Physician (Endocrinology)  Patient Active Problem List   Diagnosis Date Noted   Chronic systolic heart failure (HCC) 05/08/2022   Type 2 diabetes mellitus with stage 3a chronic kidney disease, with long-term current use of insulin (HCC) 02/16/2022   Prosthetic joint implant failure, initial encounter (HCC)  12/20/2021   Pseudophakia of both eyes 11/15/2021   DVT (deep venous thrombosis) (HCC) 07/31/2021   Acute blood loss anemia    Septic Arthritis, Infection of prosthetic right knee joint (HCC), MSSA infection 07/10/2021   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) 07/10/2021   CAD (coronary artery disease) 06/08/2021   Carotid artery disease (HCC) 06/08/2021   Symptomatic bradycardia 06/08/2021   Paroxysmal atrial fibrillation (HCC) 06/08/2021   CKD (chronic kidney disease), stage III (HCC) 06/08/2021   Polyneuropathy associated with underlying disease (HCC) 05/04/2020   Severe nonproliferative diabetic retinopathy of left eye, with macular edema, associated with type 2 diabetes mellitus (HCC) 11/05/2019   Severe nonproliferative diabetic retinopathy of right eye, with macular edema, associated with type 2 diabetes mellitus (HCC) 11/05/2019   Retinal hemorrhage of right eye 11/05/2019   Retinal hemorrhage of left eye 11/05/2019   Diabetes mellitus (HCC) 05/14/2019   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, with long-term current use of insulin (HCC) 05/14/2019   Type 2 diabetes mellitus with diabetic polyneuropathy, with long-term current use of insulin (HCC) 05/14/2019   History of gout 05/20/2018   Primary localized osteoarthritis of right knee 05/13/2018   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) 12/20/2017   Obesity (BMI 30-39.9) 11/11/2017   OSA (obstructive sleep apnea) 02/18/2017   Essential hypertension    L Breast Cancer    Hypothyroidism     Current Outpatient Medications:    acetaminophen (TYLENOL) 500 MG tablet, Take 500-1,000 mg by  mouth every 6 (six) hours as needed (for pain or headaches)., Disp: , Rfl:    allopurinol (ZYLOPRIM) 100 MG tablet, Take 100 mg by mouth daily., Disp: , Rfl:    atorvastatin (LIPITOR) 80 MG tablet, Take 1 tablet (80 mg total) by mouth at bedtime., Disp: 90 tablet, Rfl: 3   B Complex-Biotin-FA (B-COMPLEX PO), Take  1 tablet by mouth daily., Disp: , Rfl:    Biotin 10 MG TABS, Take 10 mg by mouth daily., Disp: , Rfl:    Blood Glucose Monitoring Suppl (ONETOUCH VERIO) w/Device KIT, Use to check blood sugar, Disp: 1 kit, Rfl: 0   carvedilol (COREG) 3.125 MG tablet, Take 1 tablet (3.125 mg total) by mouth 2 (two) times daily with a meal., Disp: 180 tablet, Rfl: 3   Cinnamon 500 MG capsule, Take 500 mg by mouth daily., Disp: , Rfl:    clotrimazole-betamethasone (LOTRISONE) lotion, Apply 1 application topically 2 (two) times daily as needed (itching)., Disp: , Rfl:    empagliflozin (JARDIANCE) 10 MG TABS tablet, Take 1 tablet (10 mg total) by mouth daily., Disp: 30 tablet, Rfl: 11   ferrous sulfate 325 (65 FE) MG tablet, Take 325 mg by mouth 3 (three) times a week., Disp: , Rfl:    folic acid (FOLVITE) 1 MG tablet, Take 1 tablet (1 mg total) by mouth daily., Disp: 30 tablet, Rfl: 2   gabapentin (NEURONTIN) 300 MG capsule, Take 300 mg by mouth 2 (two) times daily., Disp: , Rfl:    glucose blood (ONETOUCH VERIO) test strip, Check blood sugar 2 times daily, Disp: 100 each, Rfl: 12   insulin aspart (NOVOLOG FLEXPEN) 100 UNIT/ML FlexPen, DIAL AND INJECT UNDER THE SKIN THREE TIMES DAILY PER CORRECTION SCALE. MAX DAILY DOSE 50 UNITS, Disp: 15 mL, Rfl: 0   Insulin Glargine (BASAGLAR KWIKPEN) 100 UNIT/ML, Inject 40 Units into the skin at bedtime., Disp: 45 mL, Rfl: 2   insulin lispro (HUMALOG KWIKPEN) 100 UNIT/ML KwikPen, DIAL AND INJECT UNDER THE SKIN THREE TIMES DAILY PER CORRECTION SCALE. MAX DAILY DOSE 50 UNITS., Disp: 15 mL, Rfl: 0   Insulin Lispro (HUMALOG [place]), Inject 8 Units into the skin in the morning, at noon, and at bedtime., Disp: , Rfl:    Insulin Pen Needle 32G X 4 MM MISC, 1 Device by Does not apply route in the morning, at noon, in the evening, and at bedtime., Disp: 400 each, Rfl: 3   Lancets (ONETOUCH DELICA PLUS LANCET33G) MISC, USE   TO CHECK GLUCOSE 4 TIMES DAILY, Disp: 100 each, Rfl: 3   latanoprost  (XALATAN) 0.005 % ophthalmic solution, Place 1 drop into both eyes at bedtime., Disp: , Rfl:    levothyroxine (SYNTHROID, LEVOTHROID) 75 MCG tablet, Take 75 mcg by mouth daily before breakfast., Disp: , Rfl:    LUMIGAN 0.01 % SOLN, Place 1 drop into both eyes at bedtime., Disp: , Rfl:    Multiple Vitamins-Minerals (ALIVE ONCE DAILY WOMENS 50+ PO), Take 1 tablet by mouth daily., Disp: , Rfl:    ondansetron (ZOFRAN) 4 MG tablet, Take 1 tablet (4 mg total) by mouth every 8 (eight) hours as needed for nausea or vomiting., Disp: 20 tablet, Rfl: 0   spironolactone (ALDACTONE) 25 MG tablet, Take 1 tablet (25 mg total) by mouth daily., Disp: 90 tablet, Rfl: 3   torsemide (DEMADEX) 20 MG tablet, Take 2 tablets (40 mg total) by mouth daily., Disp: 60 tablet, Rfl: 11   Travoprost, BAK Free, (TRAVATAN) 0.004 % SOLN ophthalmic solution,  Place 1 drop into both eyes at bedtime., Disp: , Rfl:    apixaban (ELIQUIS) 5 MG TABS tablet, Take 1 tablet (5 mg total) by mouth 2 (two) times daily., Disp: 60 tablet, Rfl: 11   aspirin EC 81 MG tablet, Take 1 tablet (81 mg total) by mouth daily. Swallow whole., Disp: 360 tablet, Rfl: 0   Continuous Blood Gluc Sensor (DEXCOM G6 SENSOR) MISC, 1 Device by Does not apply route as directed. (Patient not taking: Reported on 01/31/2023), Disp: 9 each, Rfl: 3 Allergies  Allergen Reactions   Codeine Other (See Comments)   Other Other (See Comments)    Blood Product Refusal (Jehovah's witness)     Tape Other (See Comments)    Leaves blisters and marks on the skin   Sulfa Antibiotics Rash   Uloric [Febuxostat] Rash     Social History   Socioeconomic History   Marital status: Widowed    Spouse name: Not on file   Number of children: 3   Years of education: Not on file   Highest education level: Some college, no degree  Occupational History   Occupation: retired  Tobacco Use   Smoking status: Former    Current packs/day: 0.00    Average packs/day: 1 pack/day for 22.0  years (22.0 ttl pk-yrs)    Types: Cigarettes    Start date: 59    Quit date: 2    Years since quitting: 41.7   Smokeless tobacco: Never  Vaping Use   Vaping status: Never Used  Substance and Sexual Activity   Alcohol use: Yes    Comment: 05/22/2018 "glass of wine couple times/wk"   Drug use: Not Currently   Sexual activity: Not Currently    Birth control/protection: None, Post-menopausal  Other Topics Concern   Not on file  Social History Narrative   Not on file   Social Determinants of Health   Financial Resource Strain: Medium Risk (12/22/2021)   Overall Financial Resource Strain (CARDIA)    Difficulty of Paying Living Expenses: Somewhat hard  Food Insecurity: No Food Insecurity (05/08/2018)   Hunger Vital Sign    Worried About Running Out of Food in the Last Year: Never true    Ran Out of Food in the Last Year: Never true  Transportation Needs: Unmet Transportation Needs (05/08/2022)   PRAPARE - Administrator, Civil Service (Medical): Yes    Lack of Transportation (Non-Medical): Yes  Physical Activity: Not on file  Stress: Not on file  Social Connections: Not on file  Intimate Partner Violence: Not on file    Physical Exam      Future Appointments  Date Time Provider Department Center  02/08/2023  9:30 AM [facility] LAB [facility] None  02/22/2023 11:30 AM Shamleffer, Konrad Dolores, MD [facility] None  03/04/2023 10:15 AM Louann Sjogren, DPM [facility]  03/08/2023  7:05 AM [facility] DEVICE REMOTES [facility]  05/17/2023 11:00 AM MC ECHO OP 1 [facility]  05/17/2023 12:00 PM Bensimhon, Bevelyn Buckles, MD [facility] None  06/07/2023  7:05 AM [facility] DEVICE REMOTES [facility]  07/17/2023 10:00 AM MC-[facility] HS VASC 6 [facility]  07/17/2023 10:30 AM [facility] PA [facility]  09/06/2023  7:05 AM [facility] DEVICE REMOTES [facility]  12/06/2023  7:05 AM [facility] DEVICE REMOTES [facility]   03/06/2024  7:05 AM [facility] DEVICE REMOTES [facility]  06/05/2024  7:05 AM [facility] DEVICE REMOTES [facility]  09/04/2024  7:05 AM [facility] DEVICE REMOTES [facility]  12/04/2024  7:05  AM [facility] DEVICE REMOTES [facility]  03/05/2025  7:05 AM [facility] DEVICE REMOTES [facility]  06/04/2025  7:05 AM [facility] DEVICE REMOTES [facility]  09/03/2025  7:05 AM [facility] DEVICE REMOTES [facility]  12/03/2025  7:05 AM [facility] DEVICE REMOTES [facility]

## 2023-02-08 ENCOUNTER — Ambulatory Visit (HOSPITAL_COMMUNITY)
Admission: RE | Admit: 2023-02-08 | Discharge: 2023-02-08 | Disposition: A | Discharge status: Home / Self Care | Payer: Medicare HMO | Source: Ambulatory Visit | Attending: Internal Medicine | Admitting: Internal Medicine

## 2023-02-08 DIAGNOSIS — I5022 Chronic systolic (congestive) heart failure: Secondary | ICD-10-CM | POA: Diagnosis not present

## 2023-02-08 LAB — BASIC METABOLIC PANEL
Anion gap: 17 — ABNORMAL HIGH (ref 5–15)
BUN: 23 mg/dL (ref 8–23)
CO2: 27 mmol/L (ref 22–32)
Calcium: 9.4 mg/dL (ref 8.9–10.3)
Chloride: 94 mmol/L — ABNORMAL LOW (ref 98–111)
Creatinine, Ser: 1.64 mg/dL — ABNORMAL HIGH (ref 0.44–1.00)
GFR, Estimated: 32 mL/min — ABNORMAL LOW (ref >60)
Glucose, Bld: 341 mg/dL — ABNORMAL HIGH (ref 70–99)
Potassium: 4.3 mmol/L (ref 3.5–5.1)
Sodium: 138 mmol/L (ref 135–145)

## 2023-02-11 ENCOUNTER — Telehealth (HOSPITAL_COMMUNITY): Payer: Self-pay | Admitting: Emergency Medicine

## 2023-02-11 NOTE — Telephone Encounter (Signed)
Called pt's PCP today regarding concerns for her DEXCOM. Pt's insurance will not cover her DEXCOM and pt is not able to pay the copay. I called PCP and left VM with clinical assistant to inquire is another device would be practical for pt or if there is a patient assistance program to help finance the costs of her continuous glucose monitoring systems.   Will follow up.   Benson Setting EMT-P Community Paramedic  [phone]

## 2023-02-12 ENCOUNTER — Telehealth (HOSPITAL_COMMUNITY): Payer: Self-pay

## 2023-02-12 NOTE — Telephone Encounter (Signed)
Patient needing follow up on CGM monitor and what will be best for her insurance coverage. Sending to [employer] Endo Triage-   Called pt's PCP today regarding concerns for her DEXCOM. Pt's insurance will not cover her DEXCOM and pt is not able to pay the copay. I called PCP and left VM with clinical assistant to inquire is another device would be practical for pt or if there is a patient assistance program to help finance the costs of her continuous glucose monitoring systems.    Will follow up.    Benson Setting EMT-P Community Paramedic  [phone]

## 2023-02-14 ENCOUNTER — Other Ambulatory Visit (HOSPITAL_COMMUNITY): Payer: Self-pay | Admitting: Emergency Medicine

## 2023-02-14 MED ORDER — INSULIN LISPRO (1 UNIT DIAL) 100 UNIT/ML (KWIKPEN): PEN_INJECTOR | SUBCUTANEOUS | 3 refills | Status: DC

## 2023-02-14 NOTE — Telephone Encounter (Signed)
Humalog prescription sent.

## 2023-02-14 NOTE — Progress Notes (Signed)
Paramedicine Encounter    Patient ID: Danielle [name], female    DOB: [birth year], 77 y.o.   MRN: [id number]   Complaints- none, sore from working out  Assessment- +2 edema, lung sounds clear  Compliance with meds - no missed medications55  Pill box filled- for 1x week  Refills needed - jardiance, carvedilol, folic acid, asa  Meds changes since last visit - none    Social changes - none   VISIT SUMMARY**  Met with Danielle [name] in the home today for a home visit. Pt reported that she was sore and sleepy from recently starting to exercise with some stretching. Pt was assessed as noted and found to be hyperglycemic. Pt reported a healthy diet of salads, turnup greens and corn bread, and oatmeal. Pt advised that she had not taken her insulin with her breakfast this morning and was advised to do that. Pt was also noted to have increased pedal edema. Pt confessed to eating a j[employer] freezer meal last night and advised that she believes that is what did it. Pt was advised that those have a lot of salt and gave alternatives to aid in staying on track. Pt was unable to get her medications over the week and was short on some of her pill. I went to retrieve her medication from the pharmacy and returned to put them in her pill box. Pill box was filled for 1x week. Will follow up in 1x week.   BP 110/68   Pulse 76   Wt 200 lb (90.7 kg)   SpO2 98%   BMI 34.33 kg/m  Weight yesterday-DNW Last visit weight-199 lbs   CBG: 341  [place] Setting EMT-P Community Paramedic  [phone]    ACTION: Home visit completed     Patient Care Team: Renford Dills, MD as PCP - General (Internal Medicine) Regan Lemming, MD as PCP - Electrophysiology (Cardiology) Quintella Reichert, MD as PCP - Sleep Medicine (Cardiology) Bensimhon, Bevelyn Buckles, MD as PCP - Cardiology (Cardiology) Burna Sis, LCSW as Social Worker (Licensed Clinical Social Worker) Shamleffer, Konrad Dolores, MD as Consulting  Physician (Endocrinology)  Patient Active Problem List   Diagnosis Date Noted   Chronic systolic heart failure (HCC) 05/08/2022   Type 2 diabetes mellitus with stage 3a chronic kidney disease, with long-term current use of insulin (HCC) 02/16/2022   Prosthetic joint implant failure, initial encounter (HCC) 12/20/2021   Pseudophakia of both eyes 11/15/2021   DVT (deep venous thrombosis) (HCC) 07/31/2021   Acute blood loss anemia    Septic Arthritis, Infection of prosthetic right knee joint (HCC), MSSA infection 07/10/2021   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) 07/10/2021   CAD (coronary artery disease) 06/08/2021   Carotid artery disease (HCC) 06/08/2021   Symptomatic bradycardia 06/08/2021   Paroxysmal atrial fibrillation (HCC) 06/08/2021   CKD (chronic kidney disease), stage III (HCC) 06/08/2021   Polyneuropathy associated with underlying disease (HCC) 05/04/2020   Severe nonproliferative diabetic retinopathy of left eye, with macular edema, associated with type 2 diabetes mellitus (HCC) 11/05/2019   Severe nonproliferative diabetic retinopathy of right eye, with macular edema, associated with type 2 diabetes mellitus (HCC) 11/05/2019   Retinal hemorrhage of right eye 11/05/2019   Retinal hemorrhage of left eye 11/05/2019   Diabetes mellitus (HCC) 05/14/2019   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, with long-term current use of insulin (HCC) 05/14/2019   Type 2 diabetes mellitus with diabetic polyneuropathy, with long-term current use of insulin (HCC) 05/14/2019  History of gout 05/20/2018   Primary localized osteoarthritis of right knee 05/13/2018   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) 12/20/2017   Obesity (BMI 30-39.9) 11/11/2017   OSA (obstructive sleep apnea) 02/18/2017   Essential hypertension    L Breast Cancer    Hypothyroidism     Current Outpatient Medications:    acetaminophen (TYLENOL) 500 MG tablet, Take 500-1,000 mg by  mouth every 6 (six) hours as needed (for pain or headaches)., Disp: , Rfl:    allopurinol (ZYLOPRIM) 100 MG tablet, Take 100 mg by mouth daily., Disp: , Rfl:    apixaban (ELIQUIS) 5 MG TABS tablet, Take 1 tablet (5 mg total) by mouth 2 (two) times daily., Disp: 60 tablet, Rfl: 11   atorvastatin (LIPITOR) 80 MG tablet, Take 1 tablet (80 mg total) by mouth at bedtime., Disp: 90 tablet, Rfl: 3   B Complex-Biotin-FA (B-COMPLEX PO), Take 1 tablet by mouth daily., Disp: , Rfl:    Biotin 10 MG TABS, Take 10 mg by mouth daily., Disp: , Rfl:    Blood Glucose Monitoring Suppl (ONETOUCH VERIO) w/Device KIT, Use to check blood sugar, Disp: 1 kit, Rfl: 0   carvedilol (COREG) 3.125 MG tablet, Take 1 tablet (3.125 mg total) by mouth 2 (two) times daily with a meal., Disp: 180 tablet, Rfl: 3   Cinnamon 500 MG capsule, Take 500 mg by mouth daily., Disp: , Rfl:    clotrimazole-betamethasone (LOTRISONE) lotion, Apply 1 application topically 2 (two) times daily as needed (itching)., Disp: , Rfl:    empagliflozin (JARDIANCE) 10 MG TABS tablet, Take 1 tablet (10 mg total) by mouth daily., Disp: 30 tablet, Rfl: 11   ferrous sulfate 325 (65 FE) MG tablet, Take 325 mg by mouth 3 (three) times a week., Disp: , Rfl:    folic acid (FOLVITE) 1 MG tablet, Take 1 tablet (1 mg total) by mouth daily., Disp: 30 tablet, Rfl: 2   gabapentin (NEURONTIN) 300 MG capsule, Take 300 mg by mouth 2 (two) times daily., Disp: , Rfl:    glucose blood (ONETOUCH VERIO) test strip, Check blood sugar 2 times daily, Disp: 100 each, Rfl: 12   insulin aspart (NOVOLOG FLEXPEN) 100 UNIT/ML FlexPen, DIAL AND INJECT UNDER THE SKIN THREE TIMES DAILY PER CORRECTION SCALE. MAX DAILY DOSE 50 UNITS, Disp: 15 mL, Rfl: 0   Insulin Glargine (BASAGLAR KWIKPEN) 100 UNIT/ML, Inject 40 Units into the skin at bedtime., Disp: 45 mL, Rfl: 2   insulin lispro (HUMALOG KWIKPEN) 100 UNIT/ML KwikPen, Take HUmalog 8 units with each meal PLUS Humalog correctional insulin scale  max daily dose 50 units, Disp: 30 mL, Rfl: 3   Insulin Lispro (HUMALOG [place]), Inject 8 Units into the skin in the morning, at noon, and at bedtime., Disp: , Rfl:    Insulin Pen Needle 32G X 4 MM MISC, 1 Device by Does not apply route in the morning, at noon, in the evening, and at bedtime., Disp: 400 each, Rfl: 3   Lancets (ONETOUCH DELICA PLUS LANCET33G) MISC, USE   TO CHECK GLUCOSE 4 TIMES DAILY, Disp: 100 each, Rfl: 3   latanoprost (XALATAN) 0.005 % ophthalmic solution, Place 1 drop into both eyes at bedtime., Disp: , Rfl:    levothyroxine (SYNTHROID, LEVOTHROID) 75 MCG tablet, Take 75 mcg by mouth daily before breakfast., Disp: , Rfl:    LUMIGAN 0.01 % SOLN, Place 1 drop into both eyes at bedtime., Disp: , Rfl:    Multiple Vitamins-Minerals (ALIVE ONCE DAILY WOMENS 50+  PO), Take 1 tablet by mouth daily., Disp: , Rfl:    ondansetron (ZOFRAN) 4 MG tablet, Take 1 tablet (4 mg total) by mouth every 8 (eight) hours as needed for nausea or vomiting., Disp: 20 tablet, Rfl: 0   spironolactone (ALDACTONE) 25 MG tablet, Take 1 tablet (25 mg total) by mouth daily., Disp: 90 tablet, Rfl: 3   torsemide (DEMADEX) 20 MG tablet, Take 2 tablets (40 mg total) by mouth daily., Disp: 60 tablet, Rfl: 11   Travoprost, BAK Free, (TRAVATAN) 0.004 % SOLN ophthalmic solution, Place 1 drop into both eyes at bedtime., Disp: , Rfl:    aspirin EC 81 MG tablet, Take 1 tablet (81 mg total) by mouth daily. Swallow whole. (Patient not taking: Reported on 02/07/2023), Disp: 360 tablet, Rfl: 0   Continuous Blood Gluc Sensor (DEXCOM G6 SENSOR) MISC, 1 Device by Does not apply route as directed. (Patient not taking: Reported on 01/31/2023), Disp: 9 each, Rfl: 3 Allergies  Allergen Reactions   Codeine Other (See Comments)   Other Other (See Comments)    Blood Product Refusal (Jehovah's witness)     Tape Other (See Comments)    Leaves blisters and marks on the skin   Sulfa Antibiotics Rash   Uloric [Febuxostat] Rash     Social  History   Socioeconomic History   Marital status: Widowed    Spouse name: Not on file   Number of children: 3   Years of education: Not on file   Highest education level: Some college, no degree  Occupational History   Occupation: retired  Tobacco Use   Smoking status: Former    Current packs/day: 0.00    Average packs/day: 1 pack/day for 22.0 years (22.0 ttl pk-yrs)    Types: Cigarettes    Start date: 3    Quit date: 11    Years since quitting: 41.7   Smokeless tobacco: Never  Vaping Use   Vaping status: Never Used  Substance and Sexual Activity   Alcohol use: Yes    Comment: 05/22/2018 "glass of wine couple times/wk"   Drug use: Not Currently   Sexual activity: Not Currently    Birth control/protection: None, Post-menopausal  Other Topics Concern   Not on file  Social History Narrative   Not on file   Social Determinants of Health   Financial Resource Strain: Medium Risk (12/22/2021)   Overall Financial Resource Strain (CARDIA)    Difficulty of Paying Living Expenses: Somewhat hard  Food Insecurity: No Food Insecurity (05/08/2018)   Hunger Vital Sign    Worried About Running Out of Food in the Last Year: Never true    Ran Out of Food in the Last Year: Never true  Transportation Needs: Unmet Transportation Needs (05/08/2022)   PRAPARE - Administrator, Civil Service (Medical): Yes    Lack of Transportation (Non-Medical): Yes  Physical Activity: Not on file  Stress: Not on file  Social Connections: Not on file  Intimate Partner Violence: Not on file    Physical Exam      Future Appointments  Date Time Provider Department Center  02/22/2023 11:30 AM Shamleffer, Konrad Dolores, MD [facility] None  03/04/2023 10:15 AM Louann Sjogren, DPM [facility]  03/08/2023  7:05 AM [facility] DEVICE REMOTES [facility]  05/17/2023 11:00 AM [facility] ECHO OP 1 [facility]  05/17/2023 12:00 PM Bensimhon, Bevelyn Buckles, MD [facility] None   06/07/2023  7:05 AM [facility] DEVICE REMOTES [facility]  07/17/2023 10:00 AM [facility]  VASC 6 [facility]  07/17/2023 10:30 AM [facility]  09/06/2023  7:05 AM [facility] DEVICE REMOTES [facility]  12/06/2023  7:05 AM [facility] DEVICE REMOTES [facility]  03/06/2024  7:05 AM [facility] DEVICE REMOTES [facility]  06/05/2024  7:05 AM [facility] DEVICE REMOTES [facility]  09/04/2024  7:05 AM [facility] DEVICE REMOTES [facility]  12/04/2024  7:05 AM [facility] DEVICE REMOTES [facility]  03/05/2025  7:05 AM [facility] DEVICE REMOTES [facility]  06/04/2025  7:05 AM [facility] DEVICE REMOTES [facility]  09/03/2025  7:05 AM [facility] DEVICE REMOTES [facility]  12/03/2025  7:05 AM [facility] DEVICE REMOTES [facility]

## 2023-02-14 NOTE — Telephone Encounter (Signed)
Medication sent and vm left for patient to callback about meter and also sent mychart message

## 2023-02-14 NOTE — Addendum Note (Signed)
Addended by: Lisabeth Pick on: 02/14/2023 07:31 AM   Modules accepted: Orders

## 2023-02-18 ENCOUNTER — Telehealth (HOSPITAL_COMMUNITY): Payer: Self-pay | Admitting: Emergency Medicine

## 2023-02-18 ENCOUNTER — Encounter: Payer: Medicare HMO | Admitting: Podiatry

## 2023-02-18 NOTE — Telephone Encounter (Signed)
Submitted via Fax:  - Patient Certification Agreement signature - Patient HIPAA Signature - Health Care Provider Signature.   Was told processing could take 1-2 businesses days and will follow up by the end of the week.   [place] Setting EMT-P Community Paramedic  [phone]

## 2023-02-21 ENCOUNTER — Telehealth (HOSPITAL_COMMUNITY): Payer: Self-pay | Admitting: Emergency Medicine

## 2023-02-21 NOTE — Telephone Encounter (Signed)
Called Mrs. Danielle [name] to inquire what time would be best for our home visit today. Pt advised that she is out of town with family today and won't be back for the duration of my shift. Pt said that she could take pills out of bottles if necessary.   I will follow up with Mrs. Arbutus Ped on Monday.   [place] Setting EMT-P Community Paramedic  [phone]

## 2023-02-22 ENCOUNTER — Ambulatory Visit: Payer: Medicare HMO | Admitting: Internal Medicine

## 2023-02-25 ENCOUNTER — Telehealth (HOSPITAL_COMMUNITY): Payer: Self-pay | Admitting: Emergency Medicine

## 2023-02-25 ENCOUNTER — Other Ambulatory Visit: Payer: Self-pay | Admitting: Internal Medicine

## 2023-02-25 ENCOUNTER — Other Ambulatory Visit (HOSPITAL_COMMUNITY): Payer: Self-pay | Admitting: Emergency Medicine

## 2023-02-25 NOTE — Progress Notes (Signed)
Paramedicine Encounter    Patient ID: Danielle [name], female    DOB: May 06, 1946, 77 y.o.   MRN: [id number]   Complaints - none  Assessment - +1 edema at ankles, lung sound clear.  Compliance with meds - missed house visit due to traveling, reported she did well, dosing from bottles.    Pill box filled - for 1x week  Refills needed -  Carvedilol, torsemide   Meds changes since last visit - none    Social changes -  none   VISIT SUMMARY**  Met Danielle [name] in the home today. Pt missed our home visit on Thursday due to being out of town without notice. Pt advised that she has been taking the pills out of the bottles, but bottles were noted to be all over the table/bin, with no noted organization for compliance. Pt was in good spirits and had a great vacation. Pt advised that she did eat well; shrimp and grits, chicken with gravy, but pt reported that she would return to her diet now that she is home. During visit, daughter brought a load of groceries with positive diet choices. Pt was assessed as noted. Pt denied any complaints. Pt was advised that she was short on some of her medications and would need a med rec on Thursday. Pt advised she would get the meds and be ready then. Pt's pill box was filled for 1x week. Upcoming appointments reviewed. Will follow up on Thursday.   BP (!) 150/72   Pulse 76   Resp 16   Wt 203 lb (92.1 kg)   SpO2 96%   BMI 34.84 kg/m  Weight yesterday-DNW Last visit weight-200 lbs CBG: 299   [place] Setting EMT-P Community Paramedic  [phone]     ACTION: Home visit completed     Patient Care Team: Renford Dills, MD as PCP - General (Internal Medicine) Regan Lemming, MD as PCP - Electrophysiology (Cardiology) Quintella Reichert, MD as PCP - Sleep Medicine (Cardiology) Bensimhon, Bevelyn Buckles, MD as PCP - Cardiology (Cardiology) Burna Sis, LCSW as Social Worker (Licensed Clinical Social Worker) Shamleffer, Konrad Dolores, MD as Consulting  Physician (Endocrinology)  Patient Active Problem List   Diagnosis Date Noted   Chronic systolic heart failure (HCC) 05/08/2022   Type 2 diabetes mellitus with stage 3a chronic kidney disease, with long-term current use of insulin (HCC) 02/16/2022   Prosthetic joint implant failure, initial encounter (HCC) 12/20/2021   Pseudophakia of both eyes 11/15/2021   DVT (deep venous thrombosis) (HCC) 07/31/2021   Acute blood loss anemia    Septic Arthritis, Infection of prosthetic right knee joint (HCC), MSSA infection 07/10/2021   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) 07/10/2021   CAD (coronary artery disease) 06/08/2021   Carotid artery disease (HCC) 06/08/2021   Symptomatic bradycardia 06/08/2021   Paroxysmal atrial fibrillation (HCC) 06/08/2021   CKD (chronic kidney disease), stage III (HCC) 06/08/2021   Polyneuropathy associated with underlying disease (HCC) 05/04/2020   Severe nonproliferative diabetic retinopathy of left eye, with macular edema, associated with type 2 diabetes mellitus (HCC) 11/05/2019   Severe nonproliferative diabetic retinopathy of right eye, with macular edema, associated with type 2 diabetes mellitus (HCC) 11/05/2019   Retinal hemorrhage of right eye 11/05/2019   Retinal hemorrhage of left eye 11/05/2019   Diabetes mellitus (HCC) 05/14/2019   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, with long-term current use of insulin (HCC) 05/14/2019   Type 2 diabetes mellitus with diabetic polyneuropathy, with long-term current use  of insulin (HCC) 05/14/2019   History of gout 05/20/2018   Primary localized osteoarthritis of right knee 05/13/2018   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) 12/20/2017   Obesity (BMI 30-39.9) 11/11/2017   OSA (obstructive sleep apnea) 02/18/2017   Essential hypertension    L Breast Cancer    Hypothyroidism     Current Outpatient Medications:    acetaminophen (TYLENOL) 500 MG tablet, Take 500-1,000 mg by  mouth every 6 (six) hours as needed (for pain or headaches)., Disp: , Rfl:    allopurinol (ZYLOPRIM) 100 MG tablet, Take 100 mg by mouth daily., Disp: , Rfl:    apixaban (ELIQUIS) 5 MG TABS tablet, Take 1 tablet (5 mg total) by mouth 2 (two) times daily., Disp: 60 tablet, Rfl: 11   aspirin EC 81 MG tablet, Take 1 tablet (81 mg total) by mouth daily. Swallow whole., Disp: 360 tablet, Rfl: 0   atorvastatin (LIPITOR) 80 MG tablet, Take 1 tablet (80 mg total) by mouth at bedtime., Disp: 90 tablet, Rfl: 3   B Complex-Biotin-FA (B-COMPLEX PO), Take 1 tablet by mouth daily., Disp: , Rfl:    Biotin 10 MG TABS, Take 10 mg by mouth daily., Disp: , Rfl:    Blood Glucose Monitoring Suppl (ONETOUCH VERIO) w/Device KIT, Use to check blood sugar, Disp: 1 kit, Rfl: 0   carvedilol (COREG) 3.125 MG tablet, Take 1 tablet (3.125 mg total) by mouth 2 (two) times daily with a meal., Disp: 180 tablet, Rfl: 3   Cinnamon 500 MG capsule, Take 500 mg by mouth daily., Disp: , Rfl:    clotrimazole-betamethasone (LOTRISONE) lotion, Apply 1 application topically 2 (two) times daily as needed (itching)., Disp: , Rfl:    empagliflozin (JARDIANCE) 10 MG TABS tablet, Take 1 tablet (10 mg total) by mouth daily., Disp: 30 tablet, Rfl: 11   ferrous sulfate 325 (65 FE) MG tablet, Take 325 mg by mouth 3 (three) times a week., Disp: , Rfl:    folic acid (FOLVITE) 1 MG tablet, Take 1 tablet (1 mg total) by mouth daily., Disp: 30 tablet, Rfl: 2   gabapentin (NEURONTIN) 300 MG capsule, Take 300 mg by mouth 2 (two) times daily., Disp: , Rfl:    glucose blood (ONETOUCH VERIO) test strip, Check blood sugar 2 times daily, Disp: 100 each, Rfl: 12   insulin aspart (NOVOLOG FLEXPEN) 100 UNIT/ML FlexPen, DIAL AND INJECT UNDER THE SKIN THREE TIMES DAILY PER CORRECTION SCALE. MAX DAILY DOSE 50 UNITS, Disp: 15 mL, Rfl: 0   Insulin Glargine (BASAGLAR KWIKPEN) 100 UNIT/ML, Inject 40 Units into the skin at bedtime., Disp: 45 mL, Rfl: 2   insulin lispro  (HUMALOG KWIKPEN) 100 UNIT/ML KwikPen, Take HUmalog 8 units with each meal PLUS Humalog correctional insulin scale max daily dose 50 units, Disp: 30 mL, Rfl: 3   Insulin Lispro (HUMALOG [place]), Inject 8 Units into the skin in the morning, at noon, and at bedtime., Disp: , Rfl:    Insulin Pen Needle 32G X 4 MM MISC, 1 Device by Does not apply route in the morning, at noon, in the evening, and at bedtime., Disp: 400 each, Rfl: 3   Lancets (ONETOUCH DELICA PLUS LANCET33G) MISC, USE   TO CHECK GLUCOSE 4 TIMES DAILY, Disp: 100 each, Rfl: 3   latanoprost (XALATAN) 0.005 % ophthalmic solution, Place 1 drop into both eyes at bedtime., Disp: , Rfl:    levothyroxine (SYNTHROID, LEVOTHROID) 75 MCG tablet, Take 75 mcg by mouth daily before breakfast., Disp: ,  Rfl:    LUMIGAN 0.01 % SOLN, Place 1 drop into both eyes at bedtime., Disp: , Rfl:    Multiple Vitamins-Minerals (ALIVE ONCE DAILY WOMENS 50+ PO), Take 1 tablet by mouth daily., Disp: , Rfl:    ondansetron (ZOFRAN) 4 MG tablet, Take 1 tablet (4 mg total) by mouth every 8 (eight) hours as needed for nausea or vomiting., Disp: 20 tablet, Rfl: 0   spironolactone (ALDACTONE) 25 MG tablet, Take 1 tablet (25 mg total) by mouth daily., Disp: 90 tablet, Rfl: 3   torsemide (DEMADEX) 20 MG tablet, Take 2 tablets (40 mg total) by mouth daily., Disp: 60 tablet, Rfl: 11   Travoprost, BAK Free, (TRAVATAN) 0.004 % SOLN ophthalmic solution, Place 1 drop into both eyes at bedtime., Disp: , Rfl:    Continuous Blood Gluc Sensor (DEXCOM G6 SENSOR) MISC, 1 Device by Does not apply route as directed. (Patient not taking: Reported on 01/31/2023), Disp: 9 each, Rfl: 3 Allergies  Allergen Reactions   Codeine Other (See Comments)   Other Other (See Comments)    Blood Product Refusal (Jehovah's witness)     Tape Other (See Comments)    Leaves blisters and marks on the skin   Sulfa Antibiotics Rash   Uloric [Febuxostat] Rash     Social History   Socioeconomic History    Marital status: Widowed    Spouse name: Not on file   Number of children: 3   Years of education: Not on file   Highest education level: Some college, no degree  Occupational History   Occupation: retired  Tobacco Use   Smoking status: Former    Current packs/day: 0.00    Average packs/day: 1 pack/day for 22.0 years (22.0 ttl pk-yrs)    Types: Cigarettes    Start date: 21    Quit date: 29    Years since quitting: 41.7   Smokeless tobacco: Never  Vaping Use   Vaping status: Never Used  Substance and Sexual Activity   Alcohol use: Yes    Comment: 05/22/2018 "glass of wine couple times/wk"   Drug use: Not Currently   Sexual activity: Not Currently    Birth control/protection: None, Post-menopausal  Other Topics Concern   Not on file  Social History Narrative   Not on file   Social Determinants of Health   Financial Resource Strain: Medium Risk (12/22/2021)   Overall Financial Resource Strain (CARDIA)    Difficulty of Paying Living Expenses: Somewhat hard  Food Insecurity: No Food Insecurity (05/08/2018)   Hunger Vital Sign    Worried About Running Out of Food in the Last Year: Never true    Ran Out of Food in the Last Year: Never true  Transportation Needs: Unmet Transportation Needs (05/08/2022)   PRAPARE - Administrator, Civil Service (Medical): Yes    Lack of Transportation (Non-Medical): Yes  Physical Activity: Not on file  Stress: Not on file  Social Connections: Not on file  Intimate Partner Violence: Not on file    Physical Exam      Future Appointments  Date Time Provider Department Center  03/04/2023 10:15 AM Louann Sjogren, DPM [facility]  03/08/2023  7:05 AM [facility] DEVICE REMOTES [facility]  05/17/2023 11:00 AM [facility] ECHO OP 1 [facility]  05/17/2023 12:00 PM Bensimhon, Bevelyn Buckles, MD [facility] None  06/07/2023  7:05 AM [facility] DEVICE REMOTES [facility]  06/25/2023 10:50 AM Shamleffer, Konrad Dolores, MD [facility] None  07/17/2023 10:00 AM [facility]  VASC 6 [facility]  07/17/2023 10:30 AM [facility]  09/06/2023  7:05 AM [facility] DEVICE REMOTES [facility]  12/06/2023  7:05 AM [facility] DEVICE REMOTES [facility]  03/06/2024  7:05 AM [facility] DEVICE REMOTES [facility]  06/05/2024  7:05 AM [facility] DEVICE REMOTES [facility]  09/04/2024  7:05 AM [facility] DEVICE REMOTES [facility]  12/04/2024  7:05 AM [facility] DEVICE REMOTES [facility]  03/05/2025  7:05 AM [facility] DEVICE REMOTES [facility]  06/04/2025  7:05 AM [facility] DEVICE REMOTES [facility]  09/03/2025  7:05 AM [facility] DEVICE REMOTES [facility]  12/03/2025  7:05 AM [facility] DEVICE REMOTES [facility]

## 2023-02-25 NOTE — Telephone Encounter (Signed)
Called to follow up on status of Danielle [name]'s Solicitor for her Humalog. Was advised that pt's application was approved and letter sent to pt regarding same. I then contacted the speciality pharmacy through which the Humalog is processed, confirmed the correct dose and pharmacy advised that they would have it delivered by the end of the week.   Will follow up on Thursday to ensure same.   [place] Setting EMT-P Community Paramedic  [phone]

## 2023-02-26 ENCOUNTER — Telehealth (HOSPITAL_COMMUNITY): Payer: Self-pay

## 2023-02-26 NOTE — Telephone Encounter (Signed)
Called in the following prescriptions to [facility] on [address]-   -Torsemide -Carvedilol   Both will be ready for pick up tomorrow after 4PM.   Call complete.   [name], EMT-Paramedic [phone] 02/26/2023

## 2023-02-28 ENCOUNTER — Other Ambulatory Visit (HOSPITAL_COMMUNITY): Payer: Self-pay | Admitting: Emergency Medicine

## 2023-02-28 NOTE — Progress Notes (Signed)
Met rec for pt to get her back on her Thursday schedule.   Refills needed:  Carvedilol and torsemide.   Pt was left explicit instructions on how to add and was left a note for reference.   Will follow up next week.   [place] Setting EMT-P Community Paramedic  [phone]

## 2023-03-01 ENCOUNTER — Telehealth: Payer: Self-pay

## 2023-03-01 NOTE — Telephone Encounter (Signed)
Lvm for Danielle [name] the paramedic to provide pharmacy information for the [facility] or [place].

## 2023-03-04 ENCOUNTER — Encounter: Payer: Medicare HMO | Admitting: Podiatry

## 2023-03-06 ENCOUNTER — Ambulatory Visit (INDEPENDENT_AMBULATORY_CARE_PROVIDER_SITE_OTHER): Payer: Medicare HMO | Admitting: Podiatry

## 2023-03-06 ENCOUNTER — Encounter: Payer: Self-pay | Admitting: Podiatry

## 2023-03-06 DIAGNOSIS — L97522 Non-pressure chronic ulcer of other part of left foot with fat layer exposed: Secondary | ICD-10-CM | POA: Diagnosis not present

## 2023-03-06 DIAGNOSIS — Z9889 Other specified postprocedural states: Secondary | ICD-10-CM | POA: Diagnosis not present

## 2023-03-06 DIAGNOSIS — E1151 Type 2 diabetes mellitus with diabetic peripheral angiopathy without gangrene: Secondary | ICD-10-CM

## 2023-03-06 NOTE — Progress Notes (Signed)
Subjective:  Patient ID: Danielle [name], female    DOB: 11/06/1945,  MRN: [id number]  Chief Complaint  Patient presents with   Routine Post Op    Pt presents for a routine post op stated she is doing well.    DOS: 01/01/23  Procedure: Left second toe amputation   77 y.o. female returns for POV#3 . Relates doing well around the second toe. Relates new problem today this time with the left great toe. Relates some pain in the toe and noticed the other day some bleeding coming from the area. Patient has not been fitted for DM shoes and relates walking around a bit.   Review of Systems: Negative except as noted in the HPI. Denies N/V/F/Ch.  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HCC) 02/05/2017   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 05/20/2018   Acute kidney failure (HCC) 05/15/2018   Arthritis    "all over" (05/22/2018)   Blood dyscrasia    per pt-has small blood cells-appears as if anemic   Breast cancer, left breast (HCC) 1985   CHF (congestive heart failure) (HCC)    Complication of anesthesia    difficult to awaken from per pt   Coronary artery disease    Dyspnea    Essential hypertension    Heart disease    Heart murmur    History of gout    Hypothyroidism    Obesity (BMI 30-39.9) 11/11/2017   OSA (obstructive sleep apnea) 02/18/2017    severe obstructive sleep apnea with an AHI of 75.6/h and mild central sleep apnea with a CAI of 7.7/h.  Oxygen saturations dropped as low as 82%.   He is on CPAP at 9 cm H2O.   OSA on CPAP    Personal history of chemotherapy    Personal history of radiation therapy    Presence of permanent cardiac pacemaker    Primary localized osteoarthritis of right knee 05/13/2018   Refusal of blood transfusions as patient is Jehovah's Witness    Type II diabetes mellitus (HCC)     Current Outpatient Medications:    acetaminophen (TYLENOL) 500 MG tablet, Take 500-1,000  mg by mouth every 6 (six) hours as needed (for pain or headaches)., Disp: , Rfl:    allopurinol (ZYLOPRIM) 100 MG tablet, Take 100 mg by mouth daily., Disp: , Rfl:    apixaban (ELIQUIS) 5 MG TABS tablet, Take 1 tablet (5 mg total) by mouth 2 (two) times daily., Disp: 60 tablet, Rfl: 11   aspirin EC 81 MG tablet, Take 1 tablet (81 mg total) by mouth daily. Swallow whole., Disp: 360 tablet, Rfl: 0   atorvastatin (LIPITOR) 80 MG tablet, Take 1 tablet (80 mg total) by mouth at bedtime., Disp: 90 tablet, Rfl: 3   B Complex-Biotin-FA (B-COMPLEX PO), Take 1 tablet by mouth daily., Disp: , Rfl:    Biotin 10 MG TABS, Take 10 mg by mouth daily., Disp: , Rfl:    Blood Glucose Monitoring Suppl (ONETOUCH VERIO) w/Device KIT, Use to check blood sugar, Disp: 1 kit, Rfl: 0   carvedilol (COREG) 3.125 MG tablet, Take 1 tablet (3.125 mg total) by mouth 2 (two) times daily with a meal., Disp: 180 tablet, Rfl: 3   Cinnamon 500 MG capsule, Take 500 mg by mouth daily., Disp: , Rfl:    clotrimazole-betamethasone (LOTRISONE) lotion, Apply 1 application topically 2 (two) times daily as needed (itching)., Disp: , Rfl:  Continuous Blood Gluc Sensor (DEXCOM G6 SENSOR) MISC, 1 Device by Does not apply route as directed. (Patient not taking: Reported on 01/31/2023), Disp: 9 each, Rfl: 3   empagliflozin (JARDIANCE) 10 MG TABS tablet, Take 1 tablet (10 mg total) by mouth daily., Disp: 30 tablet, Rfl: 11   ferrous sulfate 325 (65 FE) MG tablet, Take 325 mg by mouth 3 (three) times a week., Disp: , Rfl:    folic acid (FOLVITE) 1 MG tablet, Take 1 tablet (1 mg total) by mouth daily., Disp: 30 tablet, Rfl: 2   gabapentin (NEURONTIN) 300 MG capsule, Take 300 mg by mouth 2 (two) times daily., Disp: , Rfl:    glucose blood (ONETOUCH VERIO) test strip, Check blood sugar 2 times daily, Disp: 100 each, Rfl: 12   insulin aspart (NOVOLOG FLEXPEN) 100 UNIT/ML FlexPen, DIAL AND INJECT UNDER THE SKIN THREE TIMES DAILY PER CORRECTION SCALE. MAX  DAILY DOSE 50 UNITS, Disp: 15 mL, Rfl: 0   Insulin Glargine (BASAGLAR KWIKPEN) 100 UNIT/ML, Inject 40 Units into the skin at bedtime., Disp: 45 mL, Rfl: 2   insulin lispro (HUMALOG KWIKPEN) 100 UNIT/ML KwikPen, Take HUmalog 8 units with each meal PLUS Humalog correctional insulin scale max daily dose 50 units, Disp: 30 mL, Rfl: 3   Insulin Lispro (HUMALOG [place]), Inject 8 Units into the skin in the morning, at noon, and at bedtime., Disp: , Rfl:    Insulin Pen Needle 32G X 4 MM MISC, 1 Device by Does not apply route in the morning, at noon, in the evening, and at bedtime., Disp: 400 each, Rfl: 3   Lancets (ONETOUCH DELICA PLUS LANCET33G) MISC, USE   TO CHECK GLUCOSE 4 TIMES DAILY, Disp: 100 each, Rfl: 3   latanoprost (XALATAN) 0.005 % ophthalmic solution, Place 1 drop into both eyes at bedtime., Disp: , Rfl:    levothyroxine (SYNTHROID, LEVOTHROID) 75 MCG tablet, Take 75 mcg by mouth daily before breakfast., Disp: , Rfl:    LUMIGAN 0.01 % SOLN, Place 1 drop into both eyes at bedtime., Disp: , Rfl:    Multiple Vitamins-Minerals (ALIVE ONCE DAILY WOMENS 50+ PO), Take 1 tablet by mouth daily., Disp: , Rfl:    ondansetron (ZOFRAN) 4 MG tablet, Take 1 tablet (4 mg total) by mouth every 8 (eight) hours as needed for nausea or vomiting., Disp: 20 tablet, Rfl: 0   spironolactone (ALDACTONE) 25 MG tablet, Take 1 tablet (25 mg total) by mouth daily., Disp: 90 tablet, Rfl: 3   torsemide (DEMADEX) 20 MG tablet, Take 2 tablets (40 mg total) by mouth daily., Disp: 60 tablet, Rfl: 11   Travoprost, BAK Free, (TRAVATAN) 0.004 % SOLN ophthalmic solution, Place 1 drop into both eyes at bedtime., Disp: , Rfl:   Social History   Tobacco Use  Smoking Status Former   Current packs/day: 0.00   Average packs/day: 1 pack/day for 22.0 years (22.0 ttl pk-yrs)   Types: Cigarettes   Start date: 33   Quit date: 68   Years since quitting: 41.8  Smokeless Tobacco Never    Allergies  Allergen Reactions   Codeine Other  (See Comments)   Other Other (See Comments)    Blood Product Refusal (Jehovah's witness)     Tape Other (See Comments)    Leaves blisters and marks on the skin   Sulfa Antibiotics Rash   Uloric [Febuxostat] Rash   Objective:  There were no vitals filed for this visit. There is no height or weight on file to calculate  BMI. Constitutional Well developed. Well nourished.  Vascular Foot warm and well perfused. Capillary refill normal to all digits.   Neurologic Normal speech. Oriented to person, place, and time. Epicritic sensation to light touch grossly present bilaterally.  Dermatologic Skin healing well without signs of infection. Skin edges well coapted without signs of infection.. Left hallux with hyperkeratosisi noted and upon debridedment ulceration noted to plantar hallux with granular base. No erythema edema or purulence noted. No probe to bone  Orthopedic: Tenderness to palpation noted about the surgical site.   Radiographs: Interval removal of left second digit  Assessment:   1. Skin ulcer of great toe, left, with fat layer exposed (HCC)   2. Post-operative state   3. Type II diabetes mellitus with peripheral circulatory disorder [facility])     Plan:  Patient was evaluated and treated and all questions answered.  S/p foot surgery left -Progressing as expected post-operatively.  Ulcer left hallux with fat layer exposed  -Debridement as below. -Dressed with betadine, DSD. -Off-loading with surgical shoe. -No abx indicated.  -Discussed glucose control and proper protein-rich diet.  -Discussed if any worsening redness, pain, fever or chills to call or may need to report to the emergency room. Patient expressed understanding.   Procedure: Excisional Debridement of Wound Rationale: Removal of non-viable soft tissue from the wound to promote healing.  Anesthesia: none Pre-Debridement Wound Measurements: Overlying callus  Post-Debridement Wound Measurements: 1 cm x 2 cm x  0.2 cm  Type of Debridement: Sharp Excisional Tissue Removed: Non-viable soft tissue Depth of Debridement: subcutaneous tissue. Technique: Sharp excisional debridement to bleeding, viable wound base.  Dressing: Dry, sterile, compression dressing. Disposition: Patient tolerated procedure well. Patient to return in 2 week for follow-up.  Return in about 2 weeks (around 03/20/2023) for wound check.   Return in about 2 weeks (around 03/20/2023) for wound check.

## 2023-03-07 ENCOUNTER — Other Ambulatory Visit (HOSPITAL_COMMUNITY): Payer: Self-pay | Admitting: Internal Medicine

## 2023-03-07 ENCOUNTER — Other Ambulatory Visit (HOSPITAL_COMMUNITY): Payer: Self-pay | Admitting: Emergency Medicine

## 2023-03-07 NOTE — Progress Notes (Signed)
Paramedicine Encounter    Patient ID: Danielle [name], female    DOB: 04/06/1946, 77 y.o.   MRN: [id number]   Complaints - sleep schedule is off.   Assessment - Lung sounds clear. Left leg, no edema noted, right leg +2 edema noted. Reported debrieded ulcer to left great toe, bandaged, unable to assess.  Compliance with meds - missed 1 levothyroxine, 1 morning dose, 1 evening dose, 2 eliquis pills, 1 carvedilol, 1 gabapentin   Pill box filled - for 1x week    Refills needed - atorvastatin, biotin, cinnamon, jardiance, folic acid.  -  Meds changes since last visit -  none    Social changes - none   VISIT SUMMARY**  Met with Danielle [name] in her home. Pt reported that she has been off her sleep cycle for the past week, going to bed, waking up at 2 am, can't sleep, fall asleep, sleep until 12. Pt reports that she would like help setting an alarm, so she can get her schedule back. I assisted pt in setting her alarm for 8 as requested. Pt reported non-compliance was due to her confusion of days vs nights and advised that she would do better this week. Pt received her acceptance letter from [facility], but denies receiving a package. I contacted the pharmacy who advised that they did deliver a package. I advocated for the patient advising that she did not receive it and requested they resend it. Will follow up on the status of same next week.  I also informed pt to be diligent on answering the phone to make sure not to miss a delivery call. Pt's pill box was filled for 1x week. Upcoming appointments reviewed. Will follow up in 1x week.   BP 96/60   Pulse 72   Resp 16   SpO2 93%  Weight yesterday-200 lbs Last visit weight-203lbs   [facility] EMT-P Community Paramedic  [phone]    ACTION: Home visit completed     Patient Care Team: Renford Dills, MD as PCP - General (Internal Medicine) Regan Lemming, MD as PCP - Electrophysiology (Cardiology) Quintella Reichert, MD as PCP  - Sleep Medicine (Cardiology) Bensimhon, Bevelyn Buckles, MD as PCP - Cardiology (Cardiology) Burna Sis, LCSW as Social Worker (Licensed Clinical Social Worker) Shamleffer, Konrad Dolores, MD as Consulting Physician (Endocrinology)  Patient Active Problem List   Diagnosis Date Noted   Chronic systolic heart failure (HCC) 05/08/2022   Type 2 diabetes mellitus with stage 3a chronic kidney disease, with long-term current use of insulin (HCC) 02/16/2022   Prosthetic joint implant failure, initial encounter (HCC) 12/20/2021   Pseudophakia of both eyes 11/15/2021   DVT (deep venous thrombosis) (HCC) 07/31/2021   Acute blood loss anemia    Septic Arthritis, Infection of prosthetic right knee joint (HCC), MSSA infection 07/10/2021   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) 07/10/2021   CAD (coronary artery disease) 06/08/2021   Carotid artery disease (HCC) 06/08/2021   Symptomatic bradycardia 06/08/2021   Paroxysmal atrial fibrillation (HCC) 06/08/2021   CKD (chronic kidney disease), stage III (HCC) 06/08/2021   Polyneuropathy associated with underlying disease (HCC) 05/04/2020   Severe nonproliferative diabetic retinopathy of left eye, with macular edema, associated with type 2 diabetes mellitus (HCC) 11/05/2019   Severe nonproliferative diabetic retinopathy of right eye, with macular edema, associated with type 2 diabetes mellitus (HCC) 11/05/2019   Retinal hemorrhage of right eye 11/05/2019   Retinal hemorrhage of left eye 11/05/2019   Diabetes mellitus (HCC)  05/14/2019   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, with long-term current use of insulin (HCC) 05/14/2019   Type 2 diabetes mellitus with diabetic polyneuropathy, with long-term current use of insulin (HCC) 05/14/2019   History of gout 05/20/2018   Primary localized osteoarthritis of right knee 05/13/2018   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) 12/20/2017   Obesity (BMI 30-39.9) 11/11/2017   OSA  (obstructive sleep apnea) 02/18/2017   Essential hypertension    L Breast Cancer    Hypothyroidism     Current Outpatient Medications:    acetaminophen (TYLENOL) 500 MG tablet, Take 500-1,000 mg by mouth every 6 (six) hours as needed (for pain or headaches)., Disp: , Rfl:    allopurinol (ZYLOPRIM) 100 MG tablet, Take 100 mg by mouth daily., Disp: , Rfl:    apixaban (ELIQUIS) 5 MG TABS tablet, Take 1 tablet (5 mg total) by mouth 2 (two) times daily., Disp: 60 tablet, Rfl: 11   aspirin EC 81 MG tablet, Take 1 tablet (81 mg total) by mouth daily. Swallow whole., Disp: 360 tablet, Rfl: 0   atorvastatin (LIPITOR) 80 MG tablet, Take 1 tablet (80 mg total) by mouth at bedtime., Disp: 90 tablet, Rfl: 3   B Complex-Biotin-FA (B-COMPLEX PO), Take 1 tablet by mouth daily., Disp: , Rfl:    Biotin 10 MG TABS, Take 10 mg by mouth daily., Disp: , Rfl:    Blood Glucose Monitoring Suppl (ONETOUCH VERIO) w/Device KIT, Use to check blood sugar, Disp: 1 kit, Rfl: 0   carvedilol (COREG) 3.125 MG tablet, Take 1 tablet (3.125 mg total) by mouth 2 (two) times daily with a meal., Disp: 180 tablet, Rfl: 3   Cinnamon 500 MG capsule, Take 500 mg by mouth daily., Disp: , Rfl:    clotrimazole-betamethasone (LOTRISONE) lotion, Apply 1 application topically 2 (two) times daily as needed (itching)., Disp: , Rfl:    empagliflozin (JARDIANCE) 10 MG TABS tablet, Take 1 tablet (10 mg total) by mouth daily., Disp: 30 tablet, Rfl: 11   ferrous sulfate 325 (65 FE) MG tablet, Take 325 mg by mouth 3 (three) times a week., Disp: , Rfl:    folic acid (FOLVITE) 1 MG tablet, Take 1 tablet (1 mg total) by mouth daily., Disp: 30 tablet, Rfl: 2   gabapentin (NEURONTIN) 300 MG capsule, Take 300 mg by mouth 2 (two) times daily., Disp: , Rfl:    glucose blood (ONETOUCH VERIO) test strip, Check blood sugar 2 times daily, Disp: 100 each, Rfl: 12   Insulin Glargine (BASAGLAR KWIKPEN) 100 UNIT/ML, Inject 40 Units into the skin at bedtime., Disp: 45  mL, Rfl: 2   Insulin Lispro (HUMALOG [place]), Inject 8 Units into the skin in the morning, at noon, and at bedtime., Disp: , Rfl:    Insulin Pen Needle 32G X 4 MM MISC, 1 Device by Does not apply route in the morning, at noon, in the evening, and at bedtime., Disp: 400 each, Rfl: 3   Lancets (ONETOUCH DELICA PLUS LANCET33G) MISC, USE   TO CHECK GLUCOSE 4 TIMES DAILY, Disp: 100 each, Rfl: 3   latanoprost (XALATAN) 0.005 % ophthalmic solution, Place 1 drop into both eyes at bedtime., Disp: , Rfl:    levothyroxine (SYNTHROID, LEVOTHROID) 75 MCG tablet, Take 75 mcg by mouth daily before breakfast., Disp: , Rfl:    LUMIGAN 0.01 % SOLN, Place 1 drop into both eyes at bedtime., Disp: , Rfl:    Multiple Vitamins-Minerals (ALIVE ONCE DAILY WOMENS 50+ PO), Take  1 tablet by mouth daily., Disp: , Rfl:    ondansetron (ZOFRAN) 4 MG tablet, Take 1 tablet (4 mg total) by mouth every 8 (eight) hours as needed for nausea or vomiting., Disp: 20 tablet, Rfl: 0   spironolactone (ALDACTONE) 25 MG tablet, Take 1 tablet (25 mg total) by mouth daily., Disp: 90 tablet, Rfl: 3   torsemide (DEMADEX) 20 MG tablet, Take 2 tablets (40 mg total) by mouth daily., Disp: 60 tablet, Rfl: 11   Travoprost, BAK Free, (TRAVATAN) 0.004 % SOLN ophthalmic solution, Place 1 drop into both eyes at bedtime., Disp: , Rfl:    Continuous Blood Gluc Sensor (DEXCOM G6 SENSOR) MISC, 1 Device by Does not apply route as directed. (Patient not taking: Reported on 03/07/2023), Disp: 9 each, Rfl: 3   insulin aspart (NOVOLOG FLEXPEN) 100 UNIT/ML FlexPen, DIAL AND INJECT UNDER THE SKIN THREE TIMES DAILY PER CORRECTION SCALE. MAX DAILY DOSE 50 UNITS (Patient not taking: Reported on 03/07/2023), Disp: 15 mL, Rfl: 0   insulin lispro (HUMALOG KWIKPEN) 100 UNIT/ML KwikPen, Take HUmalog 8 units with each meal PLUS Humalog correctional insulin scale max daily dose 50 units (Patient not taking: Reported on 03/07/2023), Disp: 30 mL, Rfl: 3 Allergies  Allergen Reactions    Codeine Other (See Comments)   Other Other (See Comments)    Blood Product Refusal (Jehovah's witness)     Tape Other (See Comments)    Leaves blisters and marks on the skin   Sulfa Antibiotics Rash   Uloric [Febuxostat] Rash     Social History   Socioeconomic History   Marital status: Widowed    Spouse name: Not on file   Number of children: 3   Years of education: Not on file   Highest education level: Some college, no degree  Occupational History   Occupation: retired  Tobacco Use   Smoking status: Former    Current packs/day: 0.00    Average packs/day: 1 pack/day for 22.0 years (22.0 ttl pk-yrs)    Types: Cigarettes    Start date: 82    Quit date: 36    Years since quitting: 41.8   Smokeless tobacco: Never  Vaping Use   Vaping status: Never Used  Substance and Sexual Activity   Alcohol use: Yes    Comment: 05/22/2018 "glass of wine couple times/wk"   Drug use: Not Currently   Sexual activity: Not Currently    Birth control/protection: None, Post-menopausal  Other Topics Concern   Not on file  Social History Narrative   Not on file   Social Determinants of Health   Financial Resource Strain: Medium Risk (12/22/2021)   Overall Financial Resource Strain (CARDIA)    Difficulty of Paying Living Expenses: Somewhat hard  Food Insecurity: No Food Insecurity (05/08/2018)   Hunger Vital Sign    Worried About Running Out of Food in the Last Year: Never true    Ran Out of Food in the Last Year: Never true  Transportation Needs: Unmet Transportation Needs (05/08/2022)   PRAPARE - Administrator, Civil Service (Medical): Yes    Lack of Transportation (Non-Medical): Yes  Physical Activity: Not on file  Stress: Not on file  Social Connections: Not on file  Intimate Partner Violence: Not on file    Physical Exam      Future Appointments  Date Time Provider Department Center  03/08/2023  7:05 AM [facility] DEVICE REMOTES [facility]  03/20/2023 11:15 AM Louann Sjogren, DPM [facility]  05/17/2023 11:00  AM [facility] ECHO OP 1 [facility]  05/17/2023 12:00 PM Bensimhon, Bevelyn Buckles, MD [facility] None  06/07/2023  7:05 AM [facility] DEVICE REMOTES [facility]  06/25/2023 10:50 AM Shamleffer, Konrad Dolores, MD [facility] None  07/17/2023 10:00 AM [facility] VASC 6 [facility]  07/17/2023 10:30 AM [facility]  09/06/2023  7:05 AM [facility] DEVICE REMOTES [facility]  12/06/2023  7:05 AM [facility] DEVICE REMOTES [facility]  03/06/2024  7:05 AM [facility] DEVICE REMOTES [facility]  06/05/2024  7:05 AM [facility] DEVICE REMOTES [facility]  09/04/2024  7:05 AM [facility] DEVICE REMOTES [facility]  12/04/2024  7:05 AM [facility] DEVICE REMOTES [facility]  03/05/2025  7:05 AM [facility] DEVICE REMOTES [facility]  06/04/2025  7:05 AM [facility] DEVICE REMOTES [facility]  09/03/2025  7:05 AM [facility] DEVICE REMOTES [facility]  12/03/2025  7:05 AM [facility] DEVICE REMOTES [facility]

## 2023-03-08 ENCOUNTER — Ambulatory Visit (INDEPENDENT_AMBULATORY_CARE_PROVIDER_SITE_OTHER): Payer: Medicare HMO

## 2023-03-08 ENCOUNTER — Other Ambulatory Visit: Payer: Self-pay

## 2023-03-08 DIAGNOSIS — I495 Sick sinus syndrome: Secondary | ICD-10-CM

## 2023-03-08 MED ORDER — DEXCOM G6 TRANSMITTER MISC: 3 refills | Status: AC

## 2023-03-08 MED ORDER — DEXCOM G6 SENSOR MISC: 1.0000 | 3 refills | Status: AC

## 2023-03-12 LAB — CUP PACEART REMOTE DEVICE CHECK
Battery Remaining Longevity: 36 mo
Battery Voltage: 2.94 V
Brady Statistic AP VP Percent: 0.01 %
Brady Statistic AP VS Percent: 1.34 %
Brady Statistic AS VP Percent: 0.06 %
Brady Statistic AS VS Percent: 98.6 %
Brady Statistic RA Percent Paced: 1.35 %
Brady Statistic RV Percent Paced: 0.06 %
Date Time Interrogation Session: 20241015141129
Implantable Lead Connection Status: 753985
Implantable Lead Connection Status: 753985
Implantable Lead Implant Date: 20160222
Implantable Lead Implant Date: 20160222
Implantable Lead Location: 753859
Implantable Lead Location: 753860
Implantable Lead Model: 5076
Implantable Lead Model: 5076
Implantable Pulse Generator Implant Date: 20160222
Lead Channel Impedance Value: 399 Ohm
Lead Channel Impedance Value: 418 Ohm
Lead Channel Impedance Value: 437 Ohm
Lead Channel Impedance Value: 494 Ohm
Lead Channel Pacing Threshold Amplitude: 0.75 V
Lead Channel Pacing Threshold Amplitude: 0.75 V
Lead Channel Pacing Threshold Pulse Width: 0.4 ms
Lead Channel Pacing Threshold Pulse Width: 0.4 ms
Lead Channel Sensing Intrinsic Amplitude: 17.625 mV
Lead Channel Sensing Intrinsic Amplitude: 17.625 mV
Lead Channel Sensing Intrinsic Amplitude: 4.75 mV
Lead Channel Sensing Intrinsic Amplitude: 4.75 mV
Lead Channel Setting Pacing Amplitude: 2 V
Lead Channel Setting Pacing Amplitude: 2 V
Lead Channel Setting Pacing Pulse Width: 0.4 ms
Lead Channel Setting Sensing Sensitivity: 2 mV
Zone Setting Status: 755011
Zone Setting Status: 755011

## 2023-03-14 ENCOUNTER — Ambulatory Visit: Payer: Medicare HMO | Admitting: Internal Medicine

## 2023-03-14 ENCOUNTER — Other Ambulatory Visit (HOSPITAL_COMMUNITY): Payer: Self-pay | Admitting: Emergency Medicine

## 2023-03-14 ENCOUNTER — Encounter: Payer: Self-pay | Admitting: Internal Medicine

## 2023-03-14 NOTE — Progress Notes (Signed)
Paramedicine Encounter    Patient ID: Danielle [name], female    DOB: 28-Dec-1945, 77 y.o.   MRN: [id number]   Complaints - increased stress brother and sister in law moving in, financial struggles, Thermostat breaking, not sleeping.   Assessment - Lung sounds clear. No change to edema    Compliance with meds - missed 2x evening doses and 3x levothroxine doses.   Pill box filled - for 1x week   Refills needed - atorvastatin, biotin, jardiance, folic acid, baslagar.   Meds changes since last visit - none     Social changes - Brother and sister in law moving in late Nov, financial concerns with energy bill    VISIT SUMMARY**  Met with Danielle [name] in the home today. Pt reported that she has not been sleeping well recently. Pt states that she will wake up at 2, wide awake and feels like cleaning. Pt advises that she sleeps during the day and cannot fix her sleep. She reports setting alarms for same, but just falls back to sleep. Pt has PCP appointment tomorrow and was advised to address same at appointment. Pt also received a letter from her endocrinologist who is refusing to see her as a patient. Will follow up with Dr. Nehemiah Settle to get a new referral to an endocrinologist. Pt was assessed today to note no acute abnormalities. Pt's CBG was elevated, however pt had not taken her insulin. Pt reports that she forgot as it was laid next to her food. Pt was instructed to take her insulin prior to eating to combat her elevated sugars. Pt agreed and did the same. Pt's pill box was filled for 1x week. Upcoming appointments were reviewed and pt was reminded to make transportation for same. Will follow up in 1x week.  I noted pt's device transmission was not going through per notes. When I confronted pt, she advised that she had already sent in a transmission earlier today.    BP (!) 108/58   Pulse 74   Resp 16   Wt 200 lb (90.7 kg)   SpO2 96%   BMI 34.33 kg/m  Weight yesterday-DNW Last visit  weight-201 lbs  CBG - 424  [facility] EMT-P Community Par[facility]ic  [phone]    ACTION: Home visit completed     Patient Care Team: Renford Dills, MD as PCP - General (Internal Medicine) Regan Lemming, MD as PCP - Electrophysiology (Cardiology) Quintella Reichert, MD as PCP - Sleep Medicine (Cardiology) Bensimhon, Bevelyn Buckles, MD as PCP - Cardiology (Cardiology) Burna Sis, LCSW as Social Worker (Licensed Clinical Social Worker) Shamleffer, Konrad Dolores, MD as Consulting Physician (Endocrinology)  Patient Active Problem List   Diagnosis Date Noted   Chronic systolic heart failure (HCC) 05/08/2022   Type 2 diabetes mellitus with stage 3a chronic kidney disease, with long-term current use of insulin (HCC) 02/16/2022   Prosthetic joint implant failure, initial encounter (HCC) 12/20/2021   Pseudophakia of both eyes 11/15/2021   DVT (deep venous thrombosis) (HCC) 07/31/2021   Acute blood loss anemia    Septic Arthritis, Infection of prosthetic right knee joint (HCC), MSSA infection 07/10/2021   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) 07/10/2021   CAD (coronary artery disease) 06/08/2021   Carotid artery disease (HCC) 06/08/2021   Symptomatic bradycardia 06/08/2021   Paroxysmal atrial fibrillation (HCC) 06/08/2021   CKD (chronic kidney disease), stage III (HCC) 06/08/2021   Polyneuropathy associated with underlying disease (HCC) 05/04/2020   Severe nonproliferative diabetic retinopathy of left  eye, with macular edema, associated with type 2 diabetes mellitus (HCC) 11/05/2019   Severe nonproliferative diabetic retinopathy of right eye, with macular edema, associated with type 2 diabetes mellitus (HCC) 11/05/2019   Retinal hemorrhage of right eye 11/05/2019   Retinal hemorrhage of left eye 11/05/2019   Diabetes mellitus (HCC) 05/14/2019   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, with long-term current use of insulin (HCC) 05/14/2019   Type  2 diabetes mellitus with diabetic polyneuropathy, with long-term current use of insulin (HCC) 05/14/2019   History of gout 05/20/2018   Primary localized osteoarthritis of right knee 05/13/2018   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) 12/20/2017   Obesity (BMI 30-39.9) 11/11/2017   OSA (obstructive sleep apnea) 02/18/2017   Essential hypertension    L Breast Cancer    Hypothyroidism     Current Outpatient Medications:    acetaminophen (TYLENOL) 500 MG tablet, Take 500-1,000 mg by mouth every 6 (six) hours as needed (for pain or headaches)., Disp: , Rfl:    allopurinol (ZYLOPRIM) 100 MG tablet, Take 100 mg by mouth daily., Disp: , Rfl:    apixaban (ELIQUIS) 5 MG TABS tablet, Take 1 tablet (5 mg total) by mouth 2 (two) times daily., Disp: 60 tablet, Rfl: 11   aspirin EC 81 MG tablet, Take 1 tablet (81 mg total) by mouth daily. Swallow whole., Disp: 360 tablet, Rfl: 0   atorvastatin (LIPITOR) 80 MG tablet, TAKE 1 TABLET BY MOUTH AT BEDTIME, Disp: 90 tablet, Rfl: 3   B Complex-Biotin-FA (B-COMPLEX PO), Take 1 tablet by mouth daily., Disp: , Rfl:    Biotin 10 MG TABS, Take 10 mg by mouth daily., Disp: , Rfl:    Blood Glucose Monitoring Suppl (ONETOUCH VERIO) w/Device KIT, Use to check blood sugar, Disp: 1 kit, Rfl: 0   carvedilol (COREG) 3.125 MG tablet, Take 1 tablet (3.125 mg total) by mouth 2 (two) times daily with a meal., Disp: 180 tablet, Rfl: 3   Cinnamon 500 MG capsule, Take 500 mg by mouth daily., Disp: , Rfl:    clotrimazole-betamethasone (LOTRISONE) lotion, Apply 1 application topically 2 (two) times daily as needed (itching)., Disp: , Rfl:    empagliflozin (JARDIANCE) 10 MG TABS tablet, Take 1 tablet (10 mg total) by mouth daily., Disp: 30 tablet, Rfl: 11   ferrous sulfate 325 (65 FE) MG tablet, Take 325 mg by mouth 3 (three) times a week., Disp: , Rfl:    folic acid (FOLVITE) 1 MG tablet, Take 1 tablet by mouth once daily, Disp: 90 tablet, Rfl: 3   gabapentin (NEURONTIN) 300  MG capsule, Take 300 mg by mouth 2 (two) times daily., Disp: , Rfl:    glucose blood (ONETOUCH VERIO) test strip, Check blood sugar 2 times daily, Disp: 100 each, Rfl: 12   Insulin Glargine (BASAGLAR KWIKPEN) 100 UNIT/ML, Inject 40 Units into the skin at bedtime., Disp: 45 mL, Rfl: 2   Insulin Lispro (HUMALOG [place]), Inject 8 Units into the skin in the morning, at noon, and at bedtime., Disp: , Rfl:    Insulin Pen Needle 32G X 4 MM MISC, 1 Device by Does not apply route in the morning, at noon, in the evening, and at bedtime., Disp: 400 each, Rfl: 3   Lancets (ONETOUCH DELICA PLUS LANCET33G) MISC, USE   TO CHECK GLUCOSE 4 TIMES DAILY, Disp: 100 each, Rfl: 3   latanoprost (XALATAN) 0.005 % ophthalmic solution, Place 1 drop into both eyes at bedtime., Disp: , Rfl:  levothyroxine (SYNTHROID, LEVOTHROID) 75 MCG tablet, Take 75 mcg by mouth daily before breakfast., Disp: , Rfl:    LUMIGAN 0.01 % SOLN, Place 1 drop into both eyes at bedtime., Disp: , Rfl:    Multiple Vitamins-Minerals (ALIVE ONCE DAILY WOMENS 50+ PO), Take 1 tablet by mouth daily., Disp: , Rfl:    ondansetron (ZOFRAN) 4 MG tablet, Take 1 tablet (4 mg total) by mouth every 8 (eight) hours as needed for nausea or vomiting., Disp: 20 tablet, Rfl: 0   spironolactone (ALDACTONE) 25 MG tablet, Take 1 tablet (25 mg total) by mouth daily., Disp: 90 tablet, Rfl: 3   torsemide (DEMADEX) 20 MG tablet, Take 2 tablets (40 mg total) by mouth daily., Disp: 60 tablet, Rfl: 11   Travoprost, BAK Free, (TRAVATAN) 0.004 % SOLN ophthalmic solution, Place 1 drop into both eyes at bedtime., Disp: , Rfl:    Continuous Glucose Sensor (DEXCOM G6 SENSOR) MISC, 1 Device by Does not apply route as directed. (Patient not taking: Reported on 03/14/2023), Disp: 9 each, Rfl: 3   Continuous Glucose Transmitter (DEXCOM G6 TRANSMITTER) MISC, Change every 90 days (Patient not taking: Reported on 03/14/2023), Disp: 1 each, Rfl: 3   insulin aspart (NOVOLOG FLEXPEN) 100 UNIT/ML  FlexPen, DIAL AND INJECT UNDER THE SKIN THREE TIMES DAILY PER CORRECTION SCALE. MAX DAILY DOSE 50 UNITS (Patient not taking: Reported on 03/07/2023), Disp: 15 mL, Rfl: 0   insulin lispro (HUMALOG KWIKPEN) 100 UNIT/ML KwikPen, Take HUmalog 8 units with each meal PLUS Humalog correctional insulin scale max daily dose 50 units (Patient not taking: Reported on 03/07/2023), Disp: 30 mL, Rfl: 3 Allergies  Allergen Reactions   Codeine Other (See Comments)   Other Other (See Comments)    Blood Product Refusal (Jehovah's witness)     Tape Other (See Comments)    Leaves blisters and marks on the skin   Sulfa Antibiotics Rash   Uloric [Febuxostat] Rash     Social History   Socioeconomic History   Marital status: Widowed    Spouse name: Not on file   Number of children: 3   Years of education: Not on file   Highest education level: Some college, no degree  Occupational History   Occupation: retired  Tobacco Use   Smoking status: Former    Current packs/day: 0.00    Average packs/day: 1 pack/day for 22.0 years (22.0 ttl pk-yrs)    Types: Cigarettes    Start date: 28    Quit date: 1983    Years since quitting: 41.8   Smokeless tobacco: Never  Vaping Use   Vaping status: Never Used  Substance and Sexual Activity   Alcohol use: Yes    Comment: 05/22/2018 "glass of wine couple times/wk"   Drug use: Not Currently   Sexual activity: Not Currently    Birth control/protection: None, Post-menopausal  Other Topics Concern   Not on file  Social History Narrative   Not on file   Social Determinants of Health   Financial Resource Strain: Medium Risk (12/22/2021)   Overall Financial Resource Strain (CARDIA)    Difficulty of Paying Living Expenses: Somewhat hard  Food Insecurity: No Food Insecurity (05/08/2018)   Hunger Vital Sign    Worried About Running Out of Food in the Last Year: Never true    Ran Out of Food in the Last Year: Never true  Transportation Needs: Unmet Transportation  Needs (05/08/2022)   PRAPARE - Administrator, Civil Service (Medical): Yes  Lack of Transportation (Non-Medical): Yes  Physical Activity: Not on file  Stress: Not on file  Social Connections: Not on file  Intimate Partner Violence: Not on file    Physical Exam      Future Appointments  Date Time Provider Department Center  03/20/2023 11:15 AM Louann Sjogren, DPM [facility]  05/17/2023 11:00 AM [facility] ECHO OP 1 [facility]  05/17/2023 12:00 PM Bensimhon, Bevelyn Buckles, MD [facility] None  06/07/2023  7:05 AM [facility] DEVICE REMOTES [facility]  07/17/2023 10:00 AM [facility] VASC 6 [facility]  07/17/2023 10:30 AM [facility]  09/06/2023  7:05 AM [facility] DEVICE REMOTES [facility]  12/06/2023  7:05 AM [facility] DEVICE REMOTES [facility]  03/06/2024  7:05 AM [facility] DEVICE REMOTES [facility]  06/05/2024  7:05 AM [facility] DEVICE REMOTES [facility]  09/04/2024  7:05 AM [facility] DEVICE REMOTES [facility]  12/04/2024  7:05 AM [facility] DEVICE REMOTES [facility]  03/05/2025  7:05 AM [facility] DEVICE REMOTES [facility]  06/04/2025  7:05 AM [facility] DEVICE REMOTES [facility]  09/03/2025  7:05 AM [facility] DEVICE REMOTES [facility]  12/03/2025  7:05 AM [facility] DEVICE REMOTES [facility]

## 2023-03-14 NOTE — Progress Notes (Deleted)
Name: Danielle [name]  Age/ Sex: 77 y.o., female   MRN/ DOB: [id number], 10/08/1945     PCP: Renford Dills, MD   Reason for Endocrinology Evaluation: Type 2 Diabetes Mellitus  Initial Endocrine Consultative Visit: 05/14/2019    PATIENT IDENTIFIER: Danielle [name] is a 77 y.o. female with a past medical history of T2DM, HTN, Dyslipidemia, CAD, Hx of Breast Ca (S/P chemo and lumpectomy)  . The patient has followed with Endocrinology clinic since 05/14/2019 for consultative assistance with management of her diabetes.  DIABETIC HISTORY:  Danielle [name] was diagnosed with T2DM > 20 yrs ago. Has been on  Metformin in the past which caused diarrhea, has been on prandial insulin in the past  . Her hemoglobin A1c has ranged from 6.9% in 2019, peaking at 8.0% in 2020.  On her initial visit to our clinic her A1c was 11.2% . She was on Jardiance and Lantus 130 units daily. I reduced her lantus to 80 units, added prandial insulin.   Jardriance initiated  By cardiology 09/2019  SUBJECTIVE:   During the last visit (08/15/2021): A1c 8.6 %.    Today (03/14/2023): Danielle [name] is here for a follow up on diabetes management.  She has not had the Dexcom in over a month  She has been checking glucose at home occasionally  She continues to follow-up with cardiology for CHF, they are not opposed to increasing Jardiance if needed by endocrinology  She has been following up with podiatry 11/05/2022 for left second toe wound S/p cannulation of the right common femoral artery, laser arthrectomy of left popliteal and anterior tibial artery with balloon angioplasty 07/2022   HOME DIABETES REGIMEN:  Basaglar 40 units daily Jardiance 10 mg daily  Humalog 8 units TIDQAC CF : Humalog (BG -130/25)       Statin: Yes ACE-I/ARB:Yes   CGM : unable to download       DIABETIC COMPLICATIONS: Microvascular complications:  Neuropathy, CKD III, 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  (S/P laser)  Last eye exam:  Completed 07/06/2020    Macrovascular complications:  CAD ( S/P MI and stent placement), PVD Denies: CVA     HISTORY:  Past Medical History:  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HCC) 02/05/2017   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 05/20/2018   Acute kidney failure (HCC) 05/15/2018   Arthritis    "all over" (05/22/2018)   Blood dyscrasia    per pt-has small blood cells-appears as if anemic   Breast cancer, left breast (HCC) 1985   CHF (congestive heart failure) (HCC)    Complication of anesthesia    difficult to awaken from per pt   Coronary artery disease    Dyspnea    Essential hypertension    Heart disease    Heart murmur    History of gout    Hypothyroidism    Obesity (BMI 30-39.9) 11/11/2017   OSA (obstructive sleep apnea) 02/18/2017    severe obstructive sleep apnea with an AHI of 75.6/h and mild central sleep apnea with a CAI of 7.7/h.  Oxygen saturations dropped as low as 82%.   He is on CPAP at 9 cm H2O.   OSA on CPAP    Personal history of chemotherapy    Personal history of radiation therapy    Presence of permanent cardiac pacemaker    Primary localized osteoarthritis of right knee 05/13/2018   Refusal of blood  transfusions as patient is Jehovah's Witness    Type II diabetes mellitus (HCC)    Past Surgical History:  Past Surgical History:  Procedure Laterality Date   ABDOMINAL AORTOGRAM W/LOWER EXTREMITY N/A 07/30/2022   Procedure: ABDOMINAL AORTOGRAM W/LOWER EXTREMITY;  Surgeon: Maeola Harman, MD;  Location: [facility] INVASIVE CV LAB;  Service: Cardiovascular;  Laterality: N/A;   APPLICATION OF WOUND VAC Right 04/14/2021   Procedure: APPLICATION OF WOUND VAC;  Surgeon: Joen Laura, MD;  Location: [facility] OR;  Service: Orthopedics;  Laterality: Right;   BREAST BIOPSY Left 1985   BREAST LUMPECTOMY Left 1985   BREAST LUMPECTOMY WITH NEEDLE LOCALIZATION AND AXILLARY  LYMPH NODE DISSECTION  1985   CATARACT EXTRACTION W/ INTRAOCULAR LENS  IMPLANT, BILATERAL Bilateral    CORONARY ANGIOPLASTY WITH STENT PLACEMENT  2010, 2012,2017    2 done 2010 and 1 replaced 2012 and 2 replaced in 2017   DILATION AND CURETTAGE OF UTERUS     EXCISIONAL TOTAL KNEE ARTHROPLASTY WITH ANTIBIOTIC SPACERS Right 07/12/2021   Procedure: EXCISIONAL TOTAL KNEE ARTHROPLASTY WITH ANTIBIOTIC SPACERS;  Surgeon: Joen Laura, MD;  Location: [facility] OR;  Service: Orthopedics;  Laterality: Right;   I & D KNEE WITH POLY EXCHANGE Right 04/14/2021   Procedure: IRRIGATION AND DEBRIDEMENT KNEE WITH POLY EXCHANGE;  Surgeon: Joen Laura, MD;  Location: [facility] OR;  Service: Orthopedics;  Laterality: Right;   INSERT / REPLACE / REMOVE PACEMAKER  07/19/2014   JOINT REPLACEMENT     PERIPHERAL VASCULAR ATHERECTOMY  07/30/2022   Procedure: PERIPHERAL VASCULAR ATHERECTOMY;  Surgeon: Maeola Harman, MD;  Location: [facility] INVASIVE CV LAB;  Service: Cardiovascular;;   PERIPHERAL VASCULAR INTERVENTION  07/30/2022   Procedure: PERIPHERAL VASCULAR INTERVENTION;  Surgeon: Maeola Harman, MD;  Location: [facility] INVASIVE CV LAB;  Service: Cardiovascular;;   REPLACEMENT TOTAL KNEE Left 09/2012   THYROIDECTOMY, PARTIAL  1978   TONSILLECTOMY     TOTAL KNEE ARTHROPLASTY Right 05/13/2018   Procedure: TOTAL KNEE ARTHROPLASTY;  Surgeon: Teryl Lucy, MD;  Location: [facility] ORS;  Service: Orthopedics;  Laterality: Right;   TOTAL KNEE REVISION Right 12/20/2021   Procedure: REMOVAL OF RIGHT KNEE ANTIBIOTIC SPACER AND REVISION RIGHT KNEE TOTAL ARTHROPLASTY;  Surgeon: Joen Laura, MD;  Location: [facility] OR;  Service: Orthopedics;  Laterality: Right;   TUBAL LIGATION     Social History:  reports that she quit smoking about 41 years ago. Her smoking use included cigarettes. She started smoking about 63 years ago. She has a 22 pack-year smoking history. She has never used smokeless tobacco. She reports current  alcohol use. She reports that she does not currently use drugs. Family History:  Family History  Problem Relation Age of Onset   Multiple myeloma Mother    Heart disease Father    Other Sister    Heart disease Brother    Diabetes Sister    Other Sister      HOME MEDICATIONS: Allergies as of 03/14/2023       Reactions   Codeine Other (See Comments)   Other Other (See Comments)   Blood Product Refusal (Jehovah's witness)   Tape Other (See Comments)   Leaves blisters and marks on the skin   Sulfa Antibiotics Rash   Uloric [febuxostat] Rash        Medication List        Accurate as of March 14, 2023  7:29 AM. If you have any questions, ask your nurse or doctor.  acetaminophen 500 MG tablet Commonly known as: TYLENOL Take 500-1,000 mg by mouth every 6 (six) hours as needed (for pain or headaches).   ALIVE ONCE DAILY WOMENS 50+ PO Take 1 tablet by mouth daily.   allopurinol 100 MG tablet Commonly known as: ZYLOPRIM Take 100 mg by mouth daily.   apixaban 5 MG Tabs tablet Commonly known as: Eliquis Take 1 tablet (5 mg total) by mouth 2 (two) times daily.   aspirin EC 81 MG tablet Take 1 tablet (81 mg total) by mouth daily. Swallow whole.   atorvastatin 80 MG tablet Commonly known as: LIPITOR TAKE 1 TABLET BY MOUTH AT BEDTIME   B-COMPLEX PO Take 1 tablet by mouth daily.   Basaglar KwikPen 100 UNIT/ML Inject 40 Units into the skin at bedtime.   Biotin 10 MG Tabs Take 10 mg by mouth daily.   carvedilol 3.125 MG tablet Commonly known as: COREG Take 1 tablet (3.125 mg total) by mouth 2 (two) times daily with a meal.   Cinnamon 500 MG capsule Take 500 mg by mouth daily.   clotrimazole-betamethasone lotion Commonly known as: LOTRISONE Apply 1 application topically 2 (two) times daily as needed (itching).   Dexcom G6 Sensor Misc 1 Device by Does not apply route as directed.   Dexcom G6 Transmitter Misc Change every 90 days    empagliflozin 10 MG Tabs tablet Commonly known as: Jardiance Take 1 tablet (10 mg total) by mouth daily.   ferrous sulfate 325 (65 FE) MG tablet Take 325 mg by mouth 3 (three) times a week.   folic acid 1 MG tablet Commonly known as: FOLVITE Take 1 tablet by mouth once daily   gabapentin 300 MG capsule Commonly known as: NEURONTIN Take 300 mg by mouth 2 (two) times daily.   HUMALOG [place] Inject 8 Units into the skin in the morning, at noon, and at bedtime.   insulin lispro 100 UNIT/ML KwikPen Commonly known as: HumaLOG KwikPen Take HUmalog 8 units with each meal PLUS Humalog correctional insulin scale max daily dose 50 units   Insulin Pen Needle 32G X 4 MM Misc 1 Device by Does not apply route in the morning, at noon, in the evening, and at bedtime.   latanoprost 0.005 % ophthalmic solution Commonly known as: XALATAN Place 1 drop into both eyes at bedtime.   levothyroxine 75 MCG tablet Commonly known as: SYNTHROID Take 75 mcg by mouth daily before breakfast.   Lumigan 0.01 % Soln Generic drug: bimatoprost Place 1 drop into both eyes at bedtime.   NovoLOG FlexPen 100 UNIT/ML FlexPen Generic drug: insulin aspart DIAL AND INJECT UNDER THE SKIN THREE TIMES DAILY PER CORRECTION SCALE. MAX DAILY DOSE 50 UNITS   ondansetron 4 MG tablet Commonly known as: Zofran Take 1 tablet (4 mg total) by mouth every 8 (eight) hours as needed for nausea or vomiting.   OneTouch Delica Plus Lancet33G Misc USE   TO CHECK GLUCOSE 4 TIMES DAILY   OneTouch Verio test strip Generic drug: glucose blood Check blood sugar 2 times daily   OneTouch Verio w/Device Kit Use to check blood sugar   spironolactone 25 MG tablet Commonly known as: ALDACTONE Take 1 tablet (25 mg total) by mouth daily.   torsemide 20 MG tablet Commonly known as: DEMADEX Take 2 tablets (40 mg total) by mouth daily.   Travoprost (BAK Free) 0.004 % Soln ophthalmic solution Commonly known as: TRAVATAN Place 1 drop  into both eyes at bedtime.  OBJECTIVE:   Vital Signs: There were no vitals taken for this visit.  Wt Readings from Last 3 Encounters:  03/07/23 201 lb (91.2 kg)  02/25/23 203 lb (92.1 kg)  02/14/23 200 lb (90.7 kg)     EXAM: General: Pt appears well and is in NAD Bilateral cerumen impaction noted  Heart: RRR , + systolic murmur  Lungs: CTA  Extremities:  BL LE: 1+  pretibial edema   Mental Status: Judgment, insight: intact Orientation: oriented to time, place, and person Mood and affect: no depression, anxiety, or agitation   DM Foot Exam per podiatry6/02/2023     DATA REVIEWED:   Lab Results  Component Value Date   HGBA1C 10.2 (A) 11/09/2022   HGBA1C 8.6 (A) 08/16/2022   HGBA1C 6.0 (A) 02/15/2022     Latest Reference Range & Units 09/25/22 15:12  Sodium 135 - 145 mmol/L 136  Potassium 3.5 - 5.1 mmol/L 4.0  Chloride 98 - 111 mmol/L 96 (L)  CO2 22 - 32 mmol/L 29  Glucose 70 - 99 mg/dL 213 (H)  BUN 8 - 23 mg/dL 40 (H)  Creatinine 0.86 - 1.00 mg/dL 5.78 (H)  Calcium 8.9 - 10.3 mg/dL 46.9  Anion gap 5 - 15  11  GFR, Estimated >60 mL/min 28 (L)  (L): Data is abnormally low (H): Data is abnormally high   ASSESSMENT / PLAN / RECOMMENDATIONS:   1) Type 2 Diabetes Mellitus, Neuropathic, CKD III and Macrovascular  complications - Most recent A1c of 10.2%. Goal A1c < 7.5 %.   -A1c continues to worsen -She has not used the Dexcom in over a month, prior to that we have not been able to access that information, she does not check glucose on a regular basis, upon reviewing glucose meter, the patient has been checking once every other day or so  -I do believe that she does take her insulin haphazardly -On the last visit she told me she was taking Basaglar 45 units once daily, today she tells me she has been taking 36 units daily -When I asked her how much Humalog she took last night with supper, she took 10 units, of note, on her last visit here she told me  she was taking 5 units of Humalog with meals, so I asked her to change it to 6 units -The patient tells me she has been using the correction scale and she may go up to 15 units, but her correction scale does not go all the way up to 15 units in the past, and there was no glucose reading from last night and her meter at suppertime to indicate that her baseline was 6 units plus a correction -Unfortunately, with haphazard intake of insulin she will continue to have glycemic excursions -Unable to increase Jardiance due to a GFR <35  -I have advised the patient to check glucose 3 times a day before meals -She continues to take Humalog after meals, patient advised again to take Humalog before the meal -She again declines GLP-1 agonist     MEDICATIONS: Increase Basaglar 40 units ONCE daily  Continue  Jardiance 10 mg daily per cardiology Take  Humalog 8 units with each meal Take  CF: Humalog ( BG - 140/25) 3 times daily before every meal  EDUCATION / INSTRUCTIONS: BG monitoring instructions: Patient is instructed to check her blood sugars 4 times a day, before each meal and bedtime. Call [place] [facility] if: BG persistently < 70 I reviewed the Rule of 15  for the treatment of hypoglycemia in detail with the patient. Literature supplied.   F/U in 3 months     Signed electronically by: Lyndle Herrlich, MD  [facility] Endocrinology  [facility] [id number] [place], [place] [address] Phone: [phone] FAX: [phone]   CC: Renford Dills, MD [address] [place] [address] Phone: [phone]  Fax: [phone]  Return to Endocrinology clinic as below: Future Appointments  Date Time Provider Department Center  03/14/2023 11:30 AM Abrian Hanover, Konrad Dolores, MD [facility] None  03/20/2023 11:15 AM Louann Sjogren, DPM [facility]  05/17/2023 11:00 AM [facility] ECHO OP 1 [facility]  05/17/2023 12:00 PM  Bensimhon, Bevelyn Buckles, MD [facility] None  06/07/2023  7:05 AM [facility] DEVICE REMOTES [facility]  07/17/2023 10:00 AM [facility] HS VASC 6 [facility]  07/17/2023 10:30 AM [facility] PA [facility]  09/06/2023  7:05 AM [facility] DEVICE REMOTES [facility]  12/06/2023  7:05 AM [facility] DEVICE REMOTES [facility]  03/06/2024  7:05 AM [facility] DEVICE REMOTES [facility]  06/05/2024  7:05 AM [facility] DEVICE REMOTES [facility]  09/04/2024  7:05 AM [facility] DEVICE REMOTES [facility]  12/04/2024  7:05 AM [facility] DEVICE REMOTES [facility]  03/05/2025  7:05 AM [facility] DEVICE REMOTES [facility]  06/04/2025  7:05 AM [facility] DEVICE REMOTES [facility]  09/03/2025  7:05 AM [facility] DEVICE REMOTES [facility]  12/03/2025  7:05 AM [facility] DEVICE REMOTES [facility]

## 2023-03-18 NOTE — Progress Notes (Signed)
Remote pacemaker transmission.   

## 2023-03-20 ENCOUNTER — Ambulatory Visit: Payer: Medicare HMO | Admitting: Podiatry

## 2023-03-20 ENCOUNTER — Encounter: Payer: Self-pay | Admitting: Podiatry

## 2023-03-20 DIAGNOSIS — L97522 Non-pressure chronic ulcer of other part of left foot with fat layer exposed: Secondary | ICD-10-CM

## 2023-03-20 DIAGNOSIS — E1151 Type 2 diabetes mellitus with diabetic peripheral angiopathy without gangrene: Secondary | ICD-10-CM

## 2023-03-20 NOTE — Progress Notes (Signed)
Subjective:  Patient ID: Danielle [name], female    DOB: [birth year],  MRN: [id number]  No chief complaint on file.   DOS: 01/01/23  Procedure: Left second toe amputation   77 y.o. female returns for POV#4 . Relates doing well around the second toe. Relates wound to left hallux is improving and doing well. Has been dressing as instructed   Review of Systems: Negative except as noted in the HPI. Denies N/V/F/Ch.  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HCC) 02/05/2017   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 05/20/2018   Acute kidney failure (HCC) 05/15/2018   Arthritis    "all over" (05/22/2018)   Blood dyscrasia    per pt-has small blood cells-appears as if anemic   Breast cancer, left breast (HCC) 1985   CHF (congestive heart failure) (HCC)    Complication of anesthesia    difficult to awaken from per pt   Coronary artery disease    Dyspnea    Essential hypertension    Heart disease    Heart murmur    History of gout    Hypothyroidism    Obesity (BMI 30-39.9) 11/11/2017   OSA (obstructive sleep apnea) 02/18/2017    severe obstructive sleep apnea with an AHI of 75.6/h and mild central sleep apnea with a CAI of 7.7/h.  Oxygen saturations dropped as low as 82%.   He is on CPAP at 9 cm H2O.   OSA on CPAP    Personal history of chemotherapy    Personal history of radiation therapy    Presence of permanent cardiac pacemaker    Primary localized osteoarthritis of right knee 05/13/2018   Refusal of blood transfusions as patient is Jehovah's Witness    Type II diabetes mellitus (HCC)     Current Outpatient Medications:    acetaminophen (TYLENOL) 500 MG tablet, Take 500-1,000 mg by mouth every 6 (six) hours as needed (for pain or headaches)., Disp: , Rfl:    allopurinol (ZYLOPRIM) 100 MG tablet, Take 100 mg by mouth daily., Disp: , Rfl:    apixaban (ELIQUIS) 5 MG TABS tablet, Take 1 tablet (5 mg total)  by mouth 2 (two) times daily., Disp: 60 tablet, Rfl: 11   aspirin EC 81 MG tablet, Take 1 tablet (81 mg total) by mouth daily. Swallow whole., Disp: 360 tablet, Rfl: 0   atorvastatin (LIPITOR) 80 MG tablet, TAKE 1 TABLET BY MOUTH AT BEDTIME, Disp: 90 tablet, Rfl: 3   B Complex-Biotin-FA (B-COMPLEX PO), Take 1 tablet by mouth daily., Disp: , Rfl:    Biotin 10 MG TABS, Take 10 mg by mouth daily., Disp: , Rfl:    Blood Glucose Monitoring Suppl (ONETOUCH VERIO) w/Device KIT, Use to check blood sugar, Disp: 1 kit, Rfl: 0   carvedilol (COREG) 3.125 MG tablet, Take 1 tablet (3.125 mg total) by mouth 2 (two) times daily with a meal., Disp: 180 tablet, Rfl: 3   Cinnamon 500 MG capsule, Take 500 mg by mouth daily., Disp: , Rfl:    clotrimazole-betamethasone (LOTRISONE) lotion, Apply 1 application topically 2 (two) times daily as needed (itching)., Disp: , Rfl:    Continuous Glucose Sensor (DEXCOM G6 SENSOR) MISC, 1 Device by Does not apply route as directed. (Patient not taking: Reported on 03/14/2023), Disp: 9 each, Rfl: 3   Continuous Glucose Transmitter (DEXCOM G6 TRANSMITTER) MISC, Change every 90 days (Patient not taking: Reported on 03/14/2023), Disp: 1 each, Rfl: 3  empagliflozin (JARDIANCE) 10 MG TABS tablet, Take 1 tablet (10 mg total) by mouth daily., Disp: 30 tablet, Rfl: 11   ferrous sulfate 325 (65 FE) MG tablet, Take 325 mg by mouth 3 (three) times a week., Disp: , Rfl:    folic acid (FOLVITE) 1 MG tablet, Take 1 tablet by mouth once daily, Disp: 90 tablet, Rfl: 3   gabapentin (NEURONTIN) 300 MG capsule, Take 300 mg by mouth 2 (two) times daily., Disp: , Rfl:    glucose blood (ONETOUCH VERIO) test strip, Check blood sugar 2 times daily, Disp: 100 each, Rfl: 12   insulin aspart (NOVOLOG FLEXPEN) 100 UNIT/ML FlexPen, DIAL AND INJECT UNDER THE SKIN THREE TIMES DAILY PER CORRECTION SCALE. MAX DAILY DOSE 50 UNITS (Patient not taking: Reported on 03/07/2023), Disp: 15 mL, Rfl: 0   Insulin Glargine  (BASAGLAR KWIKPEN) 100 UNIT/ML, Inject 40 Units into the skin at bedtime., Disp: 45 mL, Rfl: 2   insulin lispro (HUMALOG KWIKPEN) 100 UNIT/ML KwikPen, Take HUmalog 8 units with each meal PLUS Humalog correctional insulin scale max daily dose 50 units (Patient not taking: Reported on 03/07/2023), Disp: 30 mL, Rfl: 3   Insulin Lispro (HUMALOG [place]), Inject 8 Units into the skin in the morning, at noon, and at bedtime., Disp: , Rfl:    Insulin Pen Needle 32G X 4 MM MISC, 1 Device by Does not apply route in the morning, at noon, in the evening, and at bedtime., Disp: 400 each, Rfl: 3   Lancets (ONETOUCH DELICA PLUS LANCET33G) MISC, USE   TO CHECK GLUCOSE 4 TIMES DAILY, Disp: 100 each, Rfl: 3   latanoprost (XALATAN) 0.005 % ophthalmic solution, Place 1 drop into both eyes at bedtime., Disp: , Rfl:    levothyroxine (SYNTHROID, LEVOTHROID) 75 MCG tablet, Take 75 mcg by mouth daily before breakfast., Disp: , Rfl:    LUMIGAN 0.01 % SOLN, Place 1 drop into both eyes at bedtime., Disp: , Rfl:    Multiple Vitamins-Minerals (ALIVE ONCE DAILY WOMENS 50+ PO), Take 1 tablet by mouth daily., Disp: , Rfl:    ondansetron (ZOFRAN) 4 MG tablet, Take 1 tablet (4 mg total) by mouth every 8 (eight) hours as needed for nausea or vomiting., Disp: 20 tablet, Rfl: 0   spironolactone (ALDACTONE) 25 MG tablet, Take 1 tablet (25 mg total) by mouth daily., Disp: 90 tablet, Rfl: 3   torsemide (DEMADEX) 20 MG tablet, Take 2 tablets (40 mg total) by mouth daily., Disp: 60 tablet, Rfl: 11   Travoprost, BAK Free, (TRAVATAN) 0.004 % SOLN ophthalmic solution, Place 1 drop into both eyes at bedtime., Disp: , Rfl:   Social History   Tobacco Use  Smoking Status Former   Current packs/day: 0.00   Average packs/day: 1 pack/day for 22.0 years (22.0 ttl pk-yrs)   Types: Cigarettes   Start date: 73   Quit date: 76   Years since quitting: 41.8  Smokeless Tobacco Never    Allergies  Allergen Reactions   Codeine Other (See Comments)    Other Other (See Comments)    Blood Product Refusal (Jehovah's witness)     Tape Other (See Comments)    Leaves blisters and marks on the skin   Sulfa Antibiotics Rash   Uloric [Febuxostat] Rash   Objective:  There were no vitals filed for this visit. There is no height or weight on file to calculate BMI. Constitutional Well developed. Well nourished.  Vascular Foot warm and well perfused. Capillary refill normal to all digits.  Neurologic Normal speech. Oriented to person, place, and time. Epicritic sensation to light touch grossly present bilaterally.  Dermatologic Skin healing well without signs of infection. Skin edges well coapted without signs of infection.. Left hallux with hyperkeratosis noted and upon debridedment ulceration noted to plantar hallux with granular base. No erythema edema or purulence noted. No probe to bone  Orthopedic: Tenderness to palpation noted about the surgical site.   Radiographs: Interval removal of left second digit  Assessment:   No diagnosis found.   Plan:  Patient was evaluated and treated and all questions answered.  S/p foot surgery left -Progressing as expected post-operatively.  Ulcer left hallux with fat layer exposed  -Debridement as below. -Dressed with betadine, DSD. -Off-loading with surgical shoe. -No abx indicated.  -Discussed glucose control and proper protein-rich diet.  -Discussed if any worsening redness, pain, fever or chills to call or may need to report to the emergency room. Patient expressed understanding.   Procedure: Excisional Debridement of Wound Rationale: Removal of non-viable soft tissue from the wound to promote healing.  Anesthesia: none Pre-Debridement Wound Measurements: Overlying callus  Post-Debridement Wound Measurements: 1 cm x 0.5 cm x 0.2 cm  Type of Debridement: Sharp Excisional Tissue Removed: Non-viable soft tissue Depth of Debridement: subcutaneous tissue. Technique: Sharp excisional  debridement to bleeding, viable wound base.  Dressing: Dry, sterile, compression dressing. Disposition: Patient tolerated procedure well. Patient to return in 2 week for follow-up.  No follow-ups on file.   No follow-ups on file.

## 2023-03-21 ENCOUNTER — Telehealth (HOSPITAL_COMMUNITY): Payer: Self-pay

## 2023-03-21 ENCOUNTER — Other Ambulatory Visit (HOSPITAL_COMMUNITY): Payer: Self-pay | Admitting: Emergency Medicine

## 2023-03-21 DIAGNOSIS — Z0000 Encounter for general adult medical examination without abnormal findings: Secondary | ICD-10-CM | POA: Diagnosis not present

## 2023-03-21 DIAGNOSIS — Z23 Encounter for immunization: Secondary | ICD-10-CM | POA: Diagnosis not present

## 2023-03-21 DIAGNOSIS — Z1331 Encounter for screening for depression: Secondary | ICD-10-CM | POA: Diagnosis not present

## 2023-03-21 NOTE — Progress Notes (Addendum)
Paramedicine Encounter    Patient ID: Danielle [name], female    DOB: 02-15-1946, 77 y.o.   MRN: [id number]   Complaints - none  Assessment - lung sounds clear, +3 edema to her ankles   Compliance with meds - missed 1 full day of medications  Pill box filled - x1 week  Refills needed - Biotin  Meds changes since last visit - none    Social changes - none   VISIT SUMMARY**  Met with Danielle [name] today in the home. [name] reported that she had seen her podiatrist and PCP since our last visit. Pt did not get a referral to a new endocrinologist but requested his approval. I contacted [name], clinical assistant to Dr. Nehemiah Settle and left a message regarding a recommendation. Pt also saw podiatry this week who had also recommended the hose to increase circulation for healing. Pt denied any shortness of breath, chest pain, palpitations, or dizziness. It was noted that she had significant swelling to her ankles. Pt was wearing TED hose at the time and was advised to continue compliance with same. Pt's pill box was filled for 1x week. Will follow up in 1x week.   BP 100/70   Pulse 68   Resp 16   Wt 201 lb (91.2 kg)   SpO2 98%   BMI 34.50 kg/m  Weight yesterday-200 lbs Last visit weight-200 lbs  [place] Setting EMT-P Community Paramedic  [phone]     ACTION: Home visit completed     Patient Care Team: Renford Dills, MD as PCP - General (Internal Medicine) Regan Lemming, MD as PCP - Electrophysiology (Cardiology) Quintella Reichert, MD as PCP - Sleep Medicine (Cardiology) Bensimhon, Bevelyn Buckles, MD as PCP - Cardiology (Cardiology) Burna Sis, LCSW as Social Worker (Licensed Clinical Social Worker) Shamleffer, Konrad Dolores, MD as Consulting Physician (Endocrinology)  Patient Active Problem List   Diagnosis Date Noted   Chronic systolic heart failure (HCC) 05/08/2022   Type 2 diabetes mellitus with stage 3a chronic kidney disease, with long-term current use of insulin  (HCC) 02/16/2022   Prosthetic joint implant failure, initial encounter (HCC) 12/20/2021   Pseudophakia of both eyes 11/15/2021   DVT (deep venous thrombosis) (HCC) 07/31/2021   Acute blood loss anemia    Septic Arthritis, Infection of prosthetic right knee joint (HCC), MSSA infection 07/10/2021   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) 07/10/2021   CAD (coronary artery disease) 06/08/2021   Carotid artery disease (HCC) 06/08/2021   Symptomatic bradycardia 06/08/2021   Paroxysmal atrial fibrillation (HCC) 06/08/2021   CKD (chronic kidney disease), stage III (HCC) 06/08/2021   Polyneuropathy associated with underlying disease (HCC) 05/04/2020   Severe nonproliferative diabetic retinopathy of left eye, with macular edema, associated with type 2 diabetes mellitus (HCC) 11/05/2019   Severe nonproliferative diabetic retinopathy of right eye, with macular edema, associated with type 2 diabetes mellitus (HCC) 11/05/2019   Retinal hemorrhage of right eye 11/05/2019   Retinal hemorrhage of left eye 11/05/2019   Diabetes mellitus (HCC) 05/14/2019   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, with long-term current use of insulin (HCC) 05/14/2019   Type 2 diabetes mellitus with diabetic polyneuropathy, with long-term current use of insulin (HCC) 05/14/2019   History of gout 05/20/2018   Primary localized osteoarthritis of right knee 05/13/2018   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) 12/20/2017   Obesity (BMI 30-39.9) 11/11/2017   OSA (obstructive sleep apnea) 02/18/2017   Essential hypertension    L Breast Cancer  Hypothyroidism     Current Outpatient Medications:    acetaminophen (TYLENOL) 500 MG tablet, Take 500-1,000 mg by mouth every 6 (six) hours as needed (for pain or headaches)., Disp: , Rfl:    allopurinol (ZYLOPRIM) 100 MG tablet, Take 100 mg by mouth daily., Disp: , Rfl:    apixaban (ELIQUIS) 5 MG TABS tablet, Take 1 tablet (5 mg total) by mouth 2  (two) times daily., Disp: 60 tablet, Rfl: 11   aspirin EC 81 MG tablet, Take 1 tablet (81 mg total) by mouth daily. Swallow whole., Disp: 360 tablet, Rfl: 0   atorvastatin (LIPITOR) 80 MG tablet, TAKE 1 TABLET BY MOUTH AT BEDTIME, Disp: 90 tablet, Rfl: 3   B Complex-Biotin-FA (B-COMPLEX PO), Take 1 tablet by mouth daily., Disp: , Rfl:    Biotin 10 MG TABS, Take 10 mg by mouth daily., Disp: , Rfl:    Blood Glucose Monitoring Suppl (ONETOUCH VERIO) w/Device KIT, Use to check blood sugar, Disp: 1 kit, Rfl: 0   carvedilol (COREG) 3.125 MG tablet, Take 1 tablet (3.125 mg total) by mouth 2 (two) times daily with a meal., Disp: 180 tablet, Rfl: 3   Cinnamon 500 MG capsule, Take 500 mg by mouth daily., Disp: , Rfl:    clotrimazole-betamethasone (LOTRISONE) lotion, Apply 1 application topically 2 (two) times daily as needed (itching)., Disp: , Rfl:    empagliflozin (JARDIANCE) 10 MG TABS tablet, Take 1 tablet (10 mg total) by mouth daily., Disp: 30 tablet, Rfl: 11   ferrous sulfate 325 (65 FE) MG tablet, Take 325 mg by mouth 3 (three) times a week., Disp: , Rfl:    folic acid (FOLVITE) 1 MG tablet, Take 1 tablet by mouth once daily, Disp: 90 tablet, Rfl: 3   gabapentin (NEURONTIN) 300 MG capsule, Take 300 mg by mouth 2 (two) times daily., Disp: , Rfl:    glucose blood (ONETOUCH VERIO) test strip, Check blood sugar 2 times daily, Disp: 100 each, Rfl: 12   Insulin Glargine (BASAGLAR KWIKPEN) 100 UNIT/ML, Inject 40 Units into the skin at bedtime., Disp: 45 mL, Rfl: 2   Insulin Lispro (HUMALOG [place]), Inject 8 Units into the skin in the morning, at noon, and at bedtime., Disp: , Rfl:    Insulin Pen Needle 32G X 4 MM MISC, 1 Device by Does not apply route in the morning, at noon, in the evening, and at bedtime., Disp: 400 each, Rfl: 3   Lancets (ONETOUCH DELICA PLUS LANCET33G) MISC, USE   TO CHECK GLUCOSE 4 TIMES DAILY, Disp: 100 each, Rfl: 3   latanoprost (XALATAN) 0.005 % ophthalmic solution, Place 1 drop into  both eyes at bedtime., Disp: , Rfl:    levothyroxine (SYNTHROID, LEVOTHROID) 75 MCG tablet, Take 75 mcg by mouth daily before breakfast., Disp: , Rfl:    LUMIGAN 0.01 % SOLN, Place 1 drop into both eyes at bedtime., Disp: , Rfl:    Multiple Vitamins-Minerals (ALIVE ONCE DAILY WOMENS 50+ PO), Take 1 tablet by mouth daily., Disp: , Rfl:    ondansetron (ZOFRAN) 4 MG tablet, Take 1 tablet (4 mg total) by mouth every 8 (eight) hours as needed for nausea or vomiting., Disp: 20 tablet, Rfl: 0   spironolactone (ALDACTONE) 25 MG tablet, Take 1 tablet (25 mg total) by mouth daily., Disp: 90 tablet, Rfl: 3   torsemide (DEMADEX) 20 MG tablet, Take 2 tablets (40 mg total) by mouth daily., Disp: 60 tablet, Rfl: 11   Travoprost, BAK Free, (TRAVATAN) 0.004 % SOLN ophthalmic  solution, Place 1 drop into both eyes at bedtime., Disp: , Rfl:    Continuous Glucose Sensor (DEXCOM G6 SENSOR) MISC, 1 Device by Does not apply route as directed. (Patient not taking: Reported on 03/14/2023), Disp: 9 each, Rfl: 3   Continuous Glucose Transmitter (DEXCOM G6 TRANSMITTER) MISC, Change every 90 days (Patient not taking: Reported on 03/14/2023), Disp: 1 each, Rfl: 3   insulin aspart (NOVOLOG FLEXPEN) 100 UNIT/ML FlexPen, DIAL AND INJECT UNDER THE SKIN THREE TIMES DAILY PER CORRECTION SCALE. MAX DAILY DOSE 50 UNITS, Disp: 15 mL, Rfl: 0   insulin lispro (HUMALOG KWIKPEN) 100 UNIT/ML KwikPen, Take HUmalog 8 units with each meal PLUS Humalog correctional insulin scale max daily dose 50 units, Disp: 30 mL, Rfl: 3 Allergies  Allergen Reactions   Codeine Other (See Comments)   Other Other (See Comments)    Blood Product Refusal (Jehovah's witness)     Tape Other (See Comments)    Leaves blisters and marks on the skin   Sulfa Antibiotics Rash   Uloric [Febuxostat] Rash     Social History   Socioeconomic History   Marital status: Widowed    Spouse name: Not on file   Number of children: 3   Years of education: Not on file    Highest education level: Some college, no degree  Occupational History   Occupation: retired  Tobacco Use   Smoking status: Former    Current packs/day: 0.00    Average packs/day: 1 pack/day for 22.0 years (22.0 ttl pk-yrs)    Types: Cigarettes    Start date: 20    Quit date: 60    Years since quitting: 41.8   Smokeless tobacco: Never  Vaping Use   Vaping status: Never Used  Substance and Sexual Activity   Alcohol use: Yes    Comment: 05/22/2018 "glass of wine couple times/wk"   Drug use: Not Currently   Sexual activity: Not Currently    Birth control/protection: None, Post-menopausal  Other Topics Concern   Not on file  Social History Narrative   Not on file   Social Determinants of Health   Financial Resource Strain: Medium Risk (12/22/2021)   Overall Financial Resource Strain (CARDIA)    Difficulty of Paying Living Expenses: Somewhat hard  Food Insecurity: No Food Insecurity (05/08/2018)   Hunger Vital Sign    Worried About Running Out of Food in the Last Year: Never true    Ran Out of Food in the Last Year: Never true  Transportation Needs: Unmet Transportation Needs (05/08/2022)   PRAPARE - Administrator, Civil Service (Medical): Yes    Lack of Transportation (Non-Medical): Yes  Physical Activity: Not on file  Stress: Not on file  Social Connections: Not on file  Intimate Partner Violence: Not on file    Physical Exam      Future Appointments  Date Time Provider Department Center  04/03/2023  3:45 PM Louann Sjogren, DPM [facility]  05/17/2023 11:00 AM [facility] ECHO OP 1 [facility]  05/17/2023 12:00 PM Bensimhon, Bevelyn Buckles, MD [facility] None  06/07/2023  7:05 AM [facility] DEVICE REMOTES [facility]  07/17/2023 10:00 AM MC-CV HS VASC 6 [facility]  07/17/2023 10:30 AM [facility]  09/06/2023  7:05 AM [facility] DEVICE REMOTES [facility]  12/06/2023  7:05 AM [facility] DEVICE REMOTES [facility]  [facility]  03/06/2024  7:05 AM [facility] DEVICE REMOTES [facility]  06/05/2024  7:05 AM [facility] DEVICE REMOTES [facility]  09/04/2024  7:05  AM [facility] DEVICE REMOTES [facility]  12/04/2024  7:05 AM [facility] DEVICE REMOTES [facility]  03/05/2025  7:05 AM [facility] DEVICE REMOTES [facility]  06/04/2025  7:05 AM [facility] DEVICE REMOTES [facility]  09/03/2025  7:05 AM [facility] DEVICE REMOTES [facility]  12/03/2025  7:05 AM [facility] DEVICE REMOTES [facility]

## 2023-03-21 NOTE — Telephone Encounter (Signed)
Advanced Heart Failure Patient Advocate Encounter  The patient was approved for a [employer] grant that will help cover the cost of Carvedilol, Jardiance, Spironolactone.  Total amount awarded, $10,000.  Effective: 02/27/2023 - 02/26/2024.  BIN [id number] PCN [id number] [id number] ID [id number]  Processing information updated with paramedicine.  Burnell Blanks, CPhT Rx Patient Advocate Phone: [phone]

## 2023-03-22 ENCOUNTER — Telehealth (HOSPITAL_COMMUNITY): Payer: Self-pay

## 2023-03-22 ENCOUNTER — Other Ambulatory Visit: Payer: Self-pay | Admitting: Internal Medicine

## 2023-03-22 DIAGNOSIS — E2839 Other primary ovarian failure: Secondary | ICD-10-CM

## 2023-03-22 NOTE — Telephone Encounter (Signed)
Received a fax requesting medical records from [facility] Internal Medicine, Renford Dills M.D.. Records were successfully faxed to: [phone] ,which was the number provided.. Medical request form will be scanned into patients chart.

## 2023-03-28 ENCOUNTER — Telehealth (HOSPITAL_COMMUNITY): Payer: Self-pay | Admitting: Emergency Medicine

## 2023-03-28 ENCOUNTER — Other Ambulatory Visit (HOSPITAL_COMMUNITY): Payer: Self-pay | Admitting: Emergency Medicine

## 2023-03-28 NOTE — Progress Notes (Signed)
Paramedicine Encounter    Patient ID: Danielle [name], female    DOB: November 27, 1945, 77 y.o.   MRN: [id number]   Complaints - none   Assessment - lung sounds clear +2 edema to mid shin.   Compliance with meds - 1 missed morning dose  Pill box filled - for 1x week   Refills needed - torsemide, gabapentin, eliquis, biotin, cinnamon   Meds changes since last visit - no multi- vitamin for 48 hours prior to bone density scan     Social changes - none   VISIT SUMMARY**  Met with pt in the home. Pt reported that she had an overwhelming week and had been stressed trying to get ready for her brother. Pt reported that she had scheduled her appointment with her referred endocrinologist. Pt denied any shortness of breath, dizziness, chest pain or palpitations. Pt had no medical complaints at present. Pt's pill box filled for 1x week. Upcoming appointments were reviewed and pt reminded to request transportation for same. Will follow up in 1x week.   BP 98/62   Pulse 74   Resp 16   Wt 200 lb (90.7 kg)   SpO2 94%   BMI 34.33 kg/m  Weight yesterday-200 lbs  Last visit weight-201 lbs  CBG: 249   [place] Setting EMT-P Community Paramedic  [phone]     ACTION: Home visit completed     Patient Care Team: Renford Dills, MD as PCP - General (Internal Medicine) Regan Lemming, MD as PCP - Electrophysiology (Cardiology) Quintella Reichert, MD as PCP - Sleep Medicine (Cardiology) Bensimhon, Bevelyn Buckles, MD as PCP - Cardiology (Cardiology) Burna Sis, LCSW as Social Worker (Licensed Clinical Social Worker) Shamleffer, Konrad Dolores, MD as Consulting Physician (Endocrinology)  Patient Active Problem List   Diagnosis Date Noted   Chronic systolic heart failure (HCC) 05/08/2022   Type 2 diabetes mellitus with stage 3a chronic kidney disease, with long-term current use of insulin (HCC) 02/16/2022   Prosthetic joint implant failure, initial encounter (HCC) 12/20/2021   Pseudophakia of  both eyes 11/15/2021   DVT (deep venous thrombosis) (HCC) 07/31/2021   Acute blood loss anemia    Septic Arthritis, Infection of prosthetic right knee joint (HCC), MSSA infection 07/10/2021   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) 07/10/2021   CAD (coronary artery disease) 06/08/2021   Carotid artery disease (HCC) 06/08/2021   Symptomatic bradycardia 06/08/2021   Paroxysmal atrial fibrillation (HCC) 06/08/2021   CKD (chronic kidney disease), stage III (HCC) 06/08/2021   Polyneuropathy associated with underlying disease (HCC) 05/04/2020   Severe nonproliferative diabetic retinopathy of left eye, with macular edema, associated with type 2 diabetes mellitus (HCC) 11/05/2019   Severe nonproliferative diabetic retinopathy of right eye, with macular edema, associated with type 2 diabetes mellitus (HCC) 11/05/2019   Retinal hemorrhage of right eye 11/05/2019   Retinal hemorrhage of left eye 11/05/2019   Diabetes mellitus (HCC) 05/14/2019   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, with long-term current use of insulin (HCC) 05/14/2019   Type 2 diabetes mellitus with diabetic polyneuropathy, with long-term current use of insulin (HCC) 05/14/2019   History of gout 05/20/2018   Primary localized osteoarthritis of right knee 05/13/2018   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) 12/20/2017   Obesity (BMI 30-39.9) 11/11/2017   OSA (obstructive sleep apnea) 02/18/2017   Essential hypertension    L Breast Cancer    Hypothyroidism     Current Outpatient Medications:    acetaminophen (TYLENOL) 500 MG  tablet, Take 500-1,000 mg by mouth every 6 (six) hours as needed (for pain or headaches)., Disp: , Rfl:    allopurinol (ZYLOPRIM) 100 MG tablet, Take 100 mg by mouth daily., Disp: , Rfl:    apixaban (ELIQUIS) 5 MG TABS tablet, Take 1 tablet (5 mg total) by mouth 2 (two) times daily., Disp: 60 tablet, Rfl: 11   aspirin EC 81 MG tablet, Take 1 tablet (81 mg total) by mouth  daily. Swallow whole., Disp: 360 tablet, Rfl: 0   atorvastatin (LIPITOR) 80 MG tablet, TAKE 1 TABLET BY MOUTH AT BEDTIME, Disp: 90 tablet, Rfl: 3   B Complex-Biotin-FA (B-COMPLEX PO), Take 1 tablet by mouth daily., Disp: , Rfl:    Biotin 10 MG TABS, Take 10 mg by mouth daily., Disp: , Rfl:    Blood Glucose Monitoring Suppl (ONETOUCH VERIO) w/Device KIT, Use to check blood sugar, Disp: 1 kit, Rfl: 0   carvedilol (COREG) 3.125 MG tablet, Take 1 tablet (3.125 mg total) by mouth 2 (two) times daily with a meal., Disp: 180 tablet, Rfl: 3   Cinnamon 500 MG capsule, Take 500 mg by mouth daily., Disp: , Rfl:    clotrimazole-betamethasone (LOTRISONE) lotion, Apply 1 application topically 2 (two) times daily as needed (itching)., Disp: , Rfl:    empagliflozin (JARDIANCE) 10 MG TABS tablet, Take 1 tablet (10 mg total) by mouth daily., Disp: 30 tablet, Rfl: 11   ferrous sulfate 325 (65 FE) MG tablet, Take 325 mg by mouth 3 (three) times a week., Disp: , Rfl:    folic acid (FOLVITE) 1 MG tablet, Take 1 tablet by mouth once daily, Disp: 90 tablet, Rfl: 3   gabapentin (NEURONTIN) 300 MG capsule, Take 300 mg by mouth 2 (two) times daily., Disp: , Rfl:    glucose blood (ONETOUCH VERIO) test strip, Check blood sugar 2 times daily, Disp: 100 each, Rfl: 12   insulin aspart (NOVOLOG FLEXPEN) 100 UNIT/ML FlexPen, DIAL AND INJECT UNDER THE SKIN THREE TIMES DAILY PER CORRECTION SCALE. MAX DAILY DOSE 50 UNITS, Disp: 15 mL, Rfl: 0   Insulin Glargine (BASAGLAR KWIKPEN) 100 UNIT/ML, Inject 40 Units into the skin at bedtime., Disp: 45 mL, Rfl: 2   insulin lispro (HUMALOG KWIKPEN) 100 UNIT/ML KwikPen, Take HUmalog 8 units with each meal PLUS Humalog correctional insulin scale max daily dose 50 units, Disp: 30 mL, Rfl: 3   Insulin Lispro (HUMALOG [place]), Inject 8 Units into the skin in the morning, at noon, and at bedtime., Disp: , Rfl:    Insulin Pen Needle 32G X 4 MM MISC, 1 Device by Does not apply route in the morning, at noon,  in the evening, and at bedtime., Disp: 400 each, Rfl: 3   Lancets (ONETOUCH DELICA PLUS LANCET33G) MISC, USE   TO CHECK GLUCOSE 4 TIMES DAILY, Disp: 100 each, Rfl: 3   latanoprost (XALATAN) 0.005 % ophthalmic solution, Place 1 drop into both eyes at bedtime., Disp: , Rfl:    levothyroxine (SYNTHROID, LEVOTHROID) 75 MCG tablet, Take 75 mcg by mouth daily before breakfast., Disp: , Rfl:    LUMIGAN 0.01 % SOLN, Place 1 drop into both eyes at bedtime., Disp: , Rfl:    Multiple Vitamins-Minerals (ALIVE ONCE DAILY WOMENS 50+ PO), Take 1 tablet by mouth daily., Disp: , Rfl:    ondansetron (ZOFRAN) 4 MG tablet, Take 1 tablet (4 mg total) by mouth every 8 (eight) hours as needed for nausea or vomiting., Disp: 20 tablet, Rfl: 0   spironolactone (ALDACTONE) 25  MG tablet, Take 1 tablet (25 mg total) by mouth daily., Disp: 90 tablet, Rfl: 3   torsemide (DEMADEX) 20 MG tablet, Take 2 tablets (40 mg total) by mouth daily., Disp: 60 tablet, Rfl: 11   Travoprost, BAK Free, (TRAVATAN) 0.004 % SOLN ophthalmic solution, Place 1 drop into both eyes at bedtime., Disp: , Rfl:    Continuous Glucose Sensor (DEXCOM G6 SENSOR) MISC, 1 Device by Does not apply route as directed. (Patient not taking: Reported on 03/14/2023), Disp: 9 each, Rfl: 3   Continuous Glucose Transmitter (DEXCOM G6 TRANSMITTER) MISC, Change every 90 days (Patient not taking: Reported on 03/14/2023), Disp: 1 each, Rfl: 3 Allergies  Allergen Reactions   Codeine Other (See Comments)   Other Other (See Comments)    Blood Product Refusal (Jehovah's witness)     Tape Other (See Comments)    Leaves blisters and marks on the skin   Sulfa Antibiotics Rash   Uloric [Febuxostat] Rash     Social History   Socioeconomic History   Marital status: Widowed    Spouse name: Not on file   Number of children: 3   Years of education: Not on file   Highest education level: Some college, no degree  Occupational History   Occupation: retired  Tobacco Use    Smoking status: Former    Current packs/day: 0.00    Average packs/day: 1 pack/day for 22.0 years (22.0 ttl pk-yrs)    Types: Cigarettes    Start date: 34    Quit date: 63    Years since quitting: 41.8   Smokeless tobacco: Never  Vaping Use   Vaping status: Never Used  Substance and Sexual Activity   Alcohol use: Yes    Comment: 05/22/2018 "glass of wine couple times/wk"   Drug use: Not Currently   Sexual activity: Not Currently    Birth control/protection: None, Post-menopausal  Other Topics Concern   Not on file  Social History Narrative   Not on file   Social Determinants of Health   Financial Resource Strain: Medium Risk (12/22/2021)   Overall Financial Resource Strain (CARDIA)    Difficulty of Paying Living Expenses: Somewhat hard  Food Insecurity: No Food Insecurity (05/08/2018)   Hunger Vital Sign    Worried About Running Out of Food in the Last Year: Never true    Ran Out of Food in the Last Year: Never true  Transportation Needs: Unmet Transportation Needs (05/08/2022)   PRAPARE - Administrator, Civil Service (Medical): Yes    Lack of Transportation (Non-Medical): Yes  Physical Activity: Not on file  Stress: Not on file  Social Connections: Not on file  Intimate Partner Violence: Not on file    Physical Exam      Future Appointments  Date Time Provider Department Center  03/29/2023  3:30 PM [facility] DX DEXA 1 [facility]  04/03/2023  3:45 PM Louann Sjogren, DPM [facility]  05/17/2023 11:00 AM [facility] ECHO OP 1 [facility]  05/17/2023 12:00 PM Bensimhon, Bevelyn Buckles, MD [facility] None  06/07/2023  7:05 AM [facility] DEVICE REMOTES [facility]  07/17/2023 10:00 AM MC-CV HS VASC 6 [facility]  07/17/2023 10:30 AM [facility]  09/06/2023  7:05 AM [facility] DEVICE REMOTES [facility]  12/06/2023  7:05 AM [facility] DEVICE REMOTES [facility]  03/06/2024  7:05 AM [facility] DEVICE  REMOTES [facility]  06/05/2024  7:05 AM [facility] DEVICE REMOTES [facility]  09/04/2024  7:05 AM [facility] DEVICE  REMOTES [facility]  12/04/2024  7:05 AM [facility] DEVICE REMOTES [facility]  03/05/2025  7:05 AM [facility] DEVICE REMOTES [facility]  06/04/2025  7:05 AM [facility] DEVICE REMOTES [facility]  09/03/2025  7:05 AM [facility] DEVICE REMOTES [facility]  12/03/2025  7:05 AM [facility] DEVICE REMOTES [facility]

## 2023-03-28 NOTE — Telephone Encounter (Signed)
Pt called to advise that she was going to have a bone density scan done on Friday 11/1. She was called by the imaging center and advised to not take something, but she couldn't remember what. I looked up the comments in the order and relayed the information to "stop calcium and multi vitamins 48 hours prior to the procedure". Pt advised that she was capable to remove those from her pill box on her own and did so while on the phone. Will follow up in the home on Thursday to ensure compliance with this order.   [place] Setting EMT-P Community Paramedic  [phone]

## 2023-03-29 ENCOUNTER — Ambulatory Visit
Admission: RE | Admit: 2023-03-29 | Discharge: 2023-03-29 | Disposition: A | Discharge status: Home / Self Care | Payer: Medicare HMO | Source: Ambulatory Visit | Attending: Internal Medicine | Admitting: Internal Medicine

## 2023-03-29 DIAGNOSIS — E2839 Other primary ovarian failure: Secondary | ICD-10-CM

## 2023-03-29 DIAGNOSIS — N958 Other specified menopausal and perimenopausal disorders: Secondary | ICD-10-CM | POA: Diagnosis not present

## 2023-03-29 DIAGNOSIS — M8588 Other specified disorders of bone density and structure, other site: Secondary | ICD-10-CM | POA: Diagnosis not present

## 2023-04-03 ENCOUNTER — Ambulatory Visit: Payer: Medicare HMO | Admitting: Podiatry

## 2023-04-03 DIAGNOSIS — L97522 Non-pressure chronic ulcer of other part of left foot with fat layer exposed: Secondary | ICD-10-CM | POA: Diagnosis not present

## 2023-04-03 DIAGNOSIS — E1151 Type 2 diabetes mellitus with diabetic peripheral angiopathy without gangrene: Secondary | ICD-10-CM

## 2023-04-03 NOTE — Progress Notes (Signed)
Subjective:  Patient ID: Danielle [name], female    DOB: 11-07-1945,  MRN: [id number]  No chief complaint on file.   DOS: 01/01/23  Procedure: Left second toe amputation   77 y.o. female returns for POV#5 . Relates doing well around the second toe. Relates wound to left hallux is improving and doing well. Has been dressing as instructed   Review of Systems: Negative except as noted in the HPI. Denies N/V/F/Ch.  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HCC) 02/05/2017   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 05/20/2018   Acute kidney failure (HCC) 05/15/2018   Arthritis    "all over" (05/22/2018)   Blood dyscrasia    per pt-has small blood cells-appears as if anemic   Breast cancer, left breast (HCC) 1985   CHF (congestive heart failure) (HCC)    Complication of anesthesia    difficult to awaken from per pt   Coronary artery disease    Dyspnea    Essential hypertension    Heart disease    Heart murmur    History of gout    Hypothyroidism    Obesity (BMI 30-39.9) 11/11/2017   OSA (obstructive sleep apnea) 02/18/2017    severe obstructive sleep apnea with an AHI of 75.6/h and mild central sleep apnea with a CAI of 7.7/h.  Oxygen saturations dropped as low as 82%.   He is on CPAP at 9 cm H2O.   OSA on CPAP    Personal history of chemotherapy    Personal history of radiation therapy    Presence of permanent cardiac pacemaker    Primary localized osteoarthritis of right knee 05/13/2018   Refusal of blood transfusions as patient is Jehovah's Witness    Type II diabetes mellitus (HCC)     Current Outpatient Medications:    acetaminophen (TYLENOL) 500 MG tablet, Take 500-1,000 mg by mouth every 6 (six) hours as needed (for pain or headaches)., Disp: , Rfl:    allopurinol (ZYLOPRIM) 100 MG tablet, Take 100 mg by mouth daily., Disp: , Rfl:    apixaban (ELIQUIS) 5 MG TABS tablet, Take 1 tablet (5 mg total)  by mouth 2 (two) times daily., Disp: 60 tablet, Rfl: 11   aspirin EC 81 MG tablet, Take 1 tablet (81 mg total) by mouth daily. Swallow whole., Disp: 360 tablet, Rfl: 0   atorvastatin (LIPITOR) 80 MG tablet, TAKE 1 TABLET BY MOUTH AT BEDTIME, Disp: 90 tablet, Rfl: 3   B Complex-Biotin-FA (B-COMPLEX PO), Take 1 tablet by mouth daily., Disp: , Rfl:    Biotin 10 MG TABS, Take 10 mg by mouth daily., Disp: , Rfl:    Blood Glucose Monitoring Suppl (ONETOUCH VERIO) w/Device KIT, Use to check blood sugar, Disp: 1 kit, Rfl: 0   carvedilol (COREG) 3.125 MG tablet, Take 1 tablet (3.125 mg total) by mouth 2 (two) times daily with a meal., Disp: 180 tablet, Rfl: 3   Cinnamon 500 MG capsule, Take 500 mg by mouth daily., Disp: , Rfl:    clotrimazole-betamethasone (LOTRISONE) lotion, Apply 1 application topically 2 (two) times daily as needed (itching)., Disp: , Rfl:    Continuous Glucose Sensor (DEXCOM G6 SENSOR) MISC, 1 Device by Does not apply route as directed. (Patient not taking: Reported on 03/14/2023), Disp: 9 each, Rfl: 3   Continuous Glucose Transmitter (DEXCOM G6 TRANSMITTER) MISC, Change every 90 days (Patient not taking: Reported on 03/14/2023), Disp: 1 each, Rfl: 3  empagliflozin (JARDIANCE) 10 MG TABS tablet, Take 1 tablet (10 mg total) by mouth daily., Disp: 30 tablet, Rfl: 11   ferrous sulfate 325 (65 FE) MG tablet, Take 325 mg by mouth 3 (three) times a week., Disp: , Rfl:    folic acid (FOLVITE) 1 MG tablet, Take 1 tablet by mouth once daily, Disp: 90 tablet, Rfl: 3   gabapentin (NEURONTIN) 300 MG capsule, Take 300 mg by mouth 2 (two) times daily., Disp: , Rfl:    glucose blood (ONETOUCH VERIO) test strip, Check blood sugar 2 times daily, Disp: 100 each, Rfl: 12   insulin aspart (NOVOLOG FLEXPEN) 100 UNIT/ML FlexPen, DIAL AND INJECT UNDER THE SKIN THREE TIMES DAILY PER CORRECTION SCALE. MAX DAILY DOSE 50 UNITS, Disp: 15 mL, Rfl: 0   Insulin Glargine (BASAGLAR KWIKPEN) 100 UNIT/ML, Inject 40 Units  into the skin at bedtime., Disp: 45 mL, Rfl: 2   insulin lispro (HUMALOG KWIKPEN) 100 UNIT/ML KwikPen, Take HUmalog 8 units with each meal PLUS Humalog correctional insulin scale max daily dose 50 units, Disp: 30 mL, Rfl: 3   Insulin Lispro (HUMALOG [place]), Inject 8 Units into the skin in the morning, at noon, and at bedtime., Disp: , Rfl:    Insulin Pen Needle 32G X 4 MM MISC, 1 Device by Does not apply route in the morning, at noon, in the evening, and at bedtime., Disp: 400 each, Rfl: 3   Lancets (ONETOUCH DELICA PLUS LANCET33G) MISC, USE   TO CHECK GLUCOSE 4 TIMES DAILY, Disp: 100 each, Rfl: 3   latanoprost (XALATAN) 0.005 % ophthalmic solution, Place 1 drop into both eyes at bedtime., Disp: , Rfl:    levothyroxine (SYNTHROID, LEVOTHROID) 75 MCG tablet, Take 75 mcg by mouth daily before breakfast., Disp: , Rfl:    LUMIGAN 0.01 % SOLN, Place 1 drop into both eyes at bedtime., Disp: , Rfl:    Multiple Vitamins-Minerals (ALIVE ONCE DAILY WOMENS 50+ PO), Take 1 tablet by mouth daily., Disp: , Rfl:    ondansetron (ZOFRAN) 4 MG tablet, Take 1 tablet (4 mg total) by mouth every 8 (eight) hours as needed for nausea or vomiting., Disp: 20 tablet, Rfl: 0   spironolactone (ALDACTONE) 25 MG tablet, Take 1 tablet (25 mg total) by mouth daily., Disp: 90 tablet, Rfl: 3   torsemide (DEMADEX) 20 MG tablet, Take 2 tablets (40 mg total) by mouth daily., Disp: 60 tablet, Rfl: 11   Travoprost, BAK Free, (TRAVATAN) 0.004 % SOLN ophthalmic solution, Place 1 drop into both eyes at bedtime., Disp: , Rfl:   Social History   Tobacco Use  Smoking Status Former   Current packs/day: 0.00   Average packs/day: 1 pack/day for 22.0 years (22.0 ttl pk-yrs)   Types: Cigarettes   Start date: 23   Quit date: 41   Years since quitting: 41.8  Smokeless Tobacco Never    Allergies  Allergen Reactions   Codeine Other (See Comments)   Other Other (See Comments)    Blood Product Refusal (Jehovah's witness)     Tape Other  (See Comments)    Leaves blisters and marks on the skin   Sulfa Antibiotics Rash   Uloric [Febuxostat] Rash   Objective:  There were no vitals filed for this visit. There is no height or weight on file to calculate BMI. Constitutional Well developed. Well nourished.  Vascular Foot warm and well perfused. Capillary refill normal to all digits.   Neurologic Normal speech. Oriented to person, place, and time. Epicritic sensation  to light touch grossly present bilaterally.  Dermatologic Skin healing well without signs of infection. Skin edges well coapted without signs of infection.. Left hallux with hyperkeratosis noted and upon debridedment ulceration noted to plantar hallux with granular base. No erythema edema or purulence noted. No probe to bone  Orthopedic: Tenderness to palpation noted about the surgical site.   Radiographs: Interval removal of left second digit  Assessment:   No diagnosis found.   Plan:  Patient was evaluated and treated and all questions answered.  S/p foot surgery left -Progressing as expected post-operatively.  Ulcer left hallux with fat layer exposed  -Debridement as below. -Dressed with betadine, DSD. -Off-loading with surgical shoe. Added impression to offfload toe more.  -No abx indicated.  -Discussed glucose control and proper protein-rich diet.  -Discussed if any worsening redness, pain, fever or chills to call or may need to report to the emergency room. Patient expressed understanding.   Procedure: Excisional Debridement of Wound Rationale: Removal of non-viable soft tissue from the wound to promote healing.  Anesthesia: none Pre-Debridement Wound Measurements: Overlying callus  Post-Debridement Wound Measurements: 0.8 cm x 0.5 cm x 0.2 cm  Type of Debridement: Sharp Excisional Tissue Removed: Non-viable soft tissue Depth of Debridement: subcutaneous tissue. Technique: Sharp excisional debridement to bleeding, viable wound base.   Dressing: Dry, sterile, compression dressing. Disposition: Patient tolerated procedure well. Patient to return in 2 week for follow-up.  No follow-ups on file.   No follow-ups on file.

## 2023-04-04 ENCOUNTER — Other Ambulatory Visit (HOSPITAL_COMMUNITY): Payer: Self-pay

## 2023-04-04 ENCOUNTER — Other Ambulatory Visit (HOSPITAL_COMMUNITY): Payer: Self-pay | Admitting: Emergency Medicine

## 2023-04-04 ENCOUNTER — Other Ambulatory Visit (HOSPITAL_COMMUNITY): Payer: Self-pay | Admitting: Internal Medicine

## 2023-04-04 MED ORDER — EMPAGLIFLOZIN 10 MG PO TABS: 10.0000 mg | ORAL_TABLET | Freq: Every day | ORAL | 11 refills | Status: DC

## 2023-04-04 NOTE — Progress Notes (Signed)
Paramedicine Encounter    Patient ID: Danielle [name], female    DOB: [birth year], 77 y.o.   MRN: [id number]   Complaints - none   Assessment - right leg +2 pedal edema, left leg +1 pedal edema. Lung sounds clear.   Compliance with meds - no missed doses   Pill box filled - for 2x weeks   Refills needed - gabapentin, torsemide, jardiance and cinnamon    Meds changes since last visit - none    Social changes - none   VISIT SUMMARY**  Met with mrs. Mostek in the home and completed her [employer] Patient Assistance application for eliquis. Will fax out tomorrow. Monte reported that she felt well, but was mentally overwhelmed, trying to prepare for her brother and his wife to move in at the end of the month. Pt reported that she has been able to get around well without difficulty and reported improvement on her toe abscess. Pt was noted to have lost 8 lbs in the last week. Pt denied any chest pain, palpitations, shortness of breath, or dizziness. Pt reported maintaining her sugar to the best of her abilities. Pt's box was filled for 2x week. Upcoming appointment reviewed. Will follow up in 2x weeks.   BP (!) 140/76   Pulse 68   Resp 18   Wt 192 lb (87.1 kg)   SpO2 94%   BMI 32.96 kg/m  Weight yesterday-DNW Last visit weight- 200 lbs CBG: 289   [place] EMT-P Community Paramedic  [phone]     ACTION: Home visit completed     Patient Care Team: Renford Dills, MD as PCP - General (Internal Medicine) Regan Lemming, MD as PCP - Electrophysiology (Cardiology) Quintella Reichert, MD as PCP - Sleep Medicine (Cardiology) Bensimhon, Bevelyn Buckles, MD as PCP - Cardiology (Cardiology) Burna Sis, LCSW as Social Worker (Licensed Clinical Social Worker) Shamleffer, Konrad Dolores, MD as Consulting Physician (Endocrinology)  Patient Active Problem List   Diagnosis Date Noted   Chronic systolic heart failure (HCC) 05/08/2022   Type 2 diabetes mellitus with  stage 3a chronic kidney disease, with long-term current use of insulin (HCC) 02/16/2022   Prosthetic joint implant failure, initial encounter (HCC) 12/20/2021   Pseudophakia of both eyes 11/15/2021   DVT (deep venous thrombosis) (HCC) 07/31/2021   Acute blood loss anemia    Septic Arthritis, Infection of prosthetic right knee joint (HCC), MSSA infection 07/10/2021   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) 07/10/2021   CAD (coronary artery disease) 06/08/2021   Carotid artery disease (HCC) 06/08/2021   Symptomatic bradycardia 06/08/2021   Paroxysmal atrial fibrillation (HCC) 06/08/2021   CKD (chronic kidney disease), stage III (HCC) 06/08/2021   Polyneuropathy associated with underlying disease (HCC) 05/04/2020   Severe nonproliferative diabetic retinopathy of left eye, with macular edema, associated with type 2 diabetes mellitus (HCC) 11/05/2019   Severe nonproliferative diabetic retinopathy of right eye, with macular edema, associated with type 2 diabetes mellitus (HCC) 11/05/2019   Retinal hemorrhage of right eye 11/05/2019   Retinal hemorrhage of left eye 11/05/2019   Diabetes mellitus (HCC) 05/14/2019   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, with long-term current use of insulin (HCC) 05/14/2019   Type 2 diabetes mellitus with diabetic polyneuropathy, with long-term current use of insulin (HCC) 05/14/2019   History of gout 05/20/2018   Primary localized osteoarthritis of right knee 05/13/2018   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 Swallow whole., Disp: 360 tablet, Rfl: 0   atorvastatin (LIPITOR) 80 MG tablet, TAKE 1 TABLET BY MOUTH AT BEDTIME, Disp: 90 tablet, Rfl: 3   B Complex-Biotin-FA (B-COMPLEX PO), Take 1 tablet by mouth daily., Disp: , Rfl:    Blood Glucose Monitoring Suppl (ONETOUCH VERIO) w/Device KIT, Use to check blood sugar, Disp: 1 kit, Rfl: 0   carvedilol (COREG) 3.125 MG tablet, Take 1 tablet (3.125 mg total) by mouth 2 (two) times daily with a meal., Disp: 180 tablet, Rfl: 3   Cinnamon 500 MG capsule, Take 500 mg by mouth daily., Disp: , Rfl:    clotrimazole-betamethasone (LOTRISONE) lotion, Apply 1 application topically 2 (two) times daily as needed (itching)., Disp: , Rfl:    empagliflozin (JARDIANCE) 10 MG TABS tablet, Take 1 tablet (10 mg total) by mouth daily., Disp: 30 tablet, Rfl: 11   ferrous sulfate 325 (65 FE) MG tablet, Take 325 mg by mouth 3 (three) times a week., Disp: , Rfl:    folic acid (FOLVITE) 1 MG tablet, Take 1 tablet by mouth once daily, Disp: 90 tablet, Rfl: 3   gabapentin (NEURONTIN) 300 MG capsule, Take 300 mg by mouth 2 (two) times daily., Disp: , Rfl:    glucose blood (ONETOUCH VERIO) test strip, Check blood sugar 2 times daily, Disp: 100 each, Rfl: 12   Insulin Glargine (BASAGLAR KWIKPEN) 100 UNIT/ML, Inject 40 Units into the skin at bedtime., Disp: 45 mL, Rfl: 2   insulin lispro (HUMALOG KWIKPEN) 100 UNIT/ML KwikPen, Take HUmalog 8 units with each meal PLUS Humalog correctional insulin scale max daily dose 50 units, Disp: 30 mL, Rfl: 3   Insulin Lispro (HUMALOG [place]), Inject 8 Units into the skin in the morning, at noon, and at bedtime., Disp: , Rfl:    Insulin Pen Needle 32G X 4 MM MISC, 1 Device by Does not apply route in the morning, at noon, in the evening, and at bedtime., Disp: 400 each, Rfl: 3   Lancets (ONETOUCH DELICA PLUS LANCET33G) MISC, USE   TO CHECK GLUCOSE 4 TIMES DAILY, Disp: 100 each, Rfl: 3   latanoprost (XALATAN)  0.005 % ophthalmic solution, Place 1 drop into both eyes at bedtime., Disp: , Rfl:    levothyroxine (SYNTHROID, LEVOTHROID) 75 MCG tablet, Take 75 mcg by mouth daily before breakfast., Disp: , Rfl:    LUMIGAN 0.01 % SOLN, Place 1 drop into both eyes at bedtime., Disp: , Rfl:    Multiple Vitamins-Minerals (ALIVE ONCE DAILY WOMENS 50+ PO), Take 1 tablet by mouth daily., Disp: , Rfl:    ondansetron (ZOFRAN) 4 MG tablet, Take 1 tablet (4 mg total) by mouth every 8 (eight) hours as needed for nausea or vomiting., Disp: 20 tablet, Rfl: 0   spironolactone (ALDACTONE) 25 MG tablet, Take 1 tablet (25 mg total) by mouth daily., Disp: 90 tablet, Rfl: 3   torsemide (DEMADEX) 20 MG tablet, Take 2 tablets (40 mg total) by mouth daily., Disp: 60 tablet, Rfl: 11   Travoprost,  BAK Free, (TRAVATAN) 0.004 % SOLN ophthalmic solution, Place 1 drop into both eyes at bedtime., Disp: , Rfl:    acetaminophen (TYLENOL) 500 MG tablet, Take 500-1,000 mg by mouth every 6 (six) hours as needed (for pain or headaches)., Disp: , Rfl:    Biotin 10 MG TABS, Take 10 mg by mouth daily. (Patient not taking: Reported on 04/04/2023), Disp: , Rfl:    Continuous Glucose Sensor (DEXCOM G6 SENSOR) MISC, 1 Device by Does not apply route as directed. (Patient not taking: Reported on 03/14/2023), Disp: 9 each, Rfl: 3   Continuous Glucose Transmitter (DEXCOM G6 TRANSMITTER) MISC, Change every 90 days (Patient not taking: Reported on 03/14/2023), Disp: 1 each, Rfl: 3   insulin aspart (NOVOLOG FLEXPEN) 100 UNIT/ML FlexPen, DIAL AND INJECT UNDER THE SKIN THREE TIMES DAILY PER CORRECTION SCALE. MAX DAILY DOSE 50 UNITS (Patient not taking: Reported on 04/04/2023), Disp: 15 mL, Rfl: 0 Allergies  Allergen Reactions   Codeine Other (See Comments)   Other Other (See Comments)    Blood Product Refusal (Jehovah's witness)     Tape Other (See Comments)    Leaves blisters and marks on the skin   Sulfa Antibiotics Rash   Uloric [Febuxostat] Rash      Social History   Socioeconomic History   Marital status: Widowed    Spouse name: Not on file   Number of children: 3   Years of education: Not on file   Highest education level: Some college, no degree  Occupational History   Occupation: retired  Tobacco Use   Smoking status: Former    Current packs/day: 0.00    Average packs/day: 1 pack/day for 22.0 years (22.0 ttl pk-yrs)    Types: Cigarettes    Start date: 33    Quit date: 43    Years since quitting: 41.8   Smokeless tobacco: Never  Vaping Use   Vaping status: Never Used  Substance and Sexual Activity   Alcohol use: Yes    Comment: 05/22/2018 "glass of wine couple times/wk"   Drug use: Not Currently   Sexual activity: Not Currently    Birth control/protection: None, Post-menopausal  Other Topics Concern   Not on file  Social History Narrative   Not on file   Social Determinants of Health   Financial Resource Strain: Medium Risk (12/22/2021)   Overall Financial Resource Strain (CARDIA)    Difficulty of Paying Living Expenses: Somewhat hard  Food Insecurity: No Food Insecurity (05/08/2018)   Hunger Vital Sign    Worried About Running Out of Food in the Last Year: Never true    Ran Out of Food in the Last Year: Never true  Transportation Needs: Unmet Transportation Needs (05/08/2022)   PRAPARE - Administrator, Civil Service (Medical): Yes    Lack of Transportation (Non-Medical): Yes  Physical Activity: Not on file  Stress: Not on file  Social Connections: Not on file  Intimate Partner Violence: Not on file    Physical Exam      Future Appointments  Date Time Provider Department Center  04/17/2023  2:15 PM Louann Sjogren, DPM [facility]  05/17/2023 11:00 AM [facility] ECHO OP 1 [facility]  05/17/2023 12:00 PM Bensimhon, Bevelyn Buckles, MD [facility] None  06/07/2023  7:05 AM [facility] DEVICE REMOTES [facility]  07/17/2023 10:00 AM [facility] HS VASC 6 [facility]  07/17/2023  10:30 AM [facility]  09/06/2023  7:05 AM [facility] DEVICE REMOTES [facility]  12/06/2023  7:05  AM [facility] DEVICE REMOTES [facility]  03/06/2024  7:05 AM [facility] DEVICE REMOTES [facility]  06/05/2024  7:05 AM [facility] DEVICE REMOTES [facility]  09/04/2024  7:05 AM [facility] DEVICE REMOTES [facility]  12/04/2024  7:05 AM [facility] DEVICE REMOTES [facility]  03/05/2025  7:05 AM [facility] DEVICE REMOTES [facility]  06/04/2025  7:05 AM [facility] DEVICE REMOTES [facility]  09/03/2025  7:05 AM [facility] DEVICE REMOTES [facility]  12/03/2025  7:05 AM [facility] DEVICE REMOTES [facility]

## 2023-04-17 ENCOUNTER — Encounter: Payer: Self-pay | Admitting: Podiatry

## 2023-04-17 ENCOUNTER — Ambulatory Visit: Payer: Medicare HMO | Admitting: Podiatry

## 2023-04-17 DIAGNOSIS — E1151 Type 2 diabetes mellitus with diabetic peripheral angiopathy without gangrene: Secondary | ICD-10-CM | POA: Diagnosis not present

## 2023-04-17 DIAGNOSIS — L97522 Non-pressure chronic ulcer of other part of left foot with fat layer exposed: Secondary | ICD-10-CM | POA: Diagnosis not present

## 2023-04-17 NOTE — Progress Notes (Signed)
Subjective:  Patient ID: Danielle [name], female    DOB: [birth year],  MRN: [id number]  No chief complaint on file.   77 y.o. female returns for follow-up of left great toe wound.  Relates doing well around the second toe. Relates wound to left hallux is improving and doing well. Has been dressing as instructed   Review of Systems: Negative except as noted in the HPI. Denies N/V/F/Ch.  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HCC) 02/05/2017   Acute gout due to renal impairment involving right wrist 05/20/2018   Acute kidney failure (HCC) 05/15/2018   Arthritis    "all over" (05/22/2018)   Blood dyscrasia    per pt-has small blood cells-appears as if anemic   Breast cancer, left breast (HCC) 1985   CHF (congestive heart failure) (HCC)    Complication of anesthesia    difficult to awaken from per pt   Coronary artery disease    Dyspnea    Essential hypertension    Heart disease    Heart murmur    History of gout    Hypothyroidism    Obesity (BMI 30-39.9) 11/11/2017   OSA (obstructive sleep apnea) 02/18/2017    severe obstructive sleep apnea with an AHI of 75.6/h and mild central sleep apnea with a CAI of 7.7/h.  Oxygen saturations dropped as low as 82%.   He is on CPAP at 9 cm H2O.   OSA on CPAP    Personal history of chemotherapy    Personal history of radiation therapy    Presence of permanent cardiac pacemaker    Primary localized osteoarthritis of right knee 05/13/2018   Refusal of blood transfusions as patient is Jehovah's Witness    Type II diabetes mellitus (HCC)     Current Outpatient Medications:    acetaminophen (TYLENOL) 500 MG tablet, Take 500-1,000 mg by mouth every 6 (six) hours as needed (for pain or headaches)., Disp: , Rfl:    allopurinol (ZYLOPRIM) 100 MG tablet, Take 100 mg by mouth daily., Disp: , Rfl:    apixaban (ELIQUIS) 5 MG TABS tablet, Take 1 tablet (5 mg total) by mouth 2 (two) times  daily., Disp: 60 tablet, Rfl: 11   aspirin EC 81 MG tablet, Take 1 tablet (81 mg total) by mouth daily. Swallow whole., Disp: 360 tablet, Rfl: 0   atorvastatin (LIPITOR) 80 MG tablet, TAKE 1 TABLET BY MOUTH AT BEDTIME, Disp: 90 tablet, Rfl: 3   B Complex-Biotin-FA (B-COMPLEX PO), Take 1 tablet by mouth daily., Disp: , Rfl:    Biotin 10 MG TABS, Take 10 mg by mouth daily. (Patient not taking: Reported on 04/04/2023), Disp: , Rfl:    Blood Glucose Monitoring Suppl (ONETOUCH VERIO) w/Device KIT, Use to check blood sugar, Disp: 1 kit, Rfl: 0   carvedilol (COREG) 3.125 MG tablet, Take 1 tablet (3.125 mg total) by mouth 2 (two) times daily with a meal., Disp: 180 tablet, Rfl: 3   Cinnamon 500 MG capsule, Take 500 mg by mouth daily., Disp: , Rfl:    clotrimazole-betamethasone (LOTRISONE) lotion, Apply 1 application topically 2 (two) times daily as needed (itching)., Disp: , Rfl:    Continuous Glucose Sensor (DEXCOM G6 SENSOR) MISC, 1 Device by Does not apply route as directed. (Patient not taking: Reported on 03/14/2023), Disp: 9 each, Rfl: 3   Continuous Glucose Transmitter (DEXCOM G6 TRANSMITTER) MISC, Change every 90 days (Patient not taking: Reported on 03/14/2023), Disp: 1 each, Rfl:  3   empagliflozin (JARDIANCE) 10 MG TABS tablet, Take 1 tablet (10 mg total) by mouth daily., Disp: 30 tablet, Rfl: 11   ferrous sulfate 325 (65 FE) MG tablet, Take 325 mg by mouth 3 (three) times a week., Disp: , Rfl:    folic acid (FOLVITE) 1 MG tablet, Take 1 tablet by mouth once daily, Disp: 90 tablet, Rfl: 3   gabapentin (NEURONTIN) 300 MG capsule, Take 300 mg by mouth 2 (two) times daily., Disp: , Rfl:    glucose blood (ONETOUCH VERIO) test strip, Check blood sugar 2 times daily, Disp: 100 each, Rfl: 12   insulin aspart (NOVOLOG FLEXPEN) 100 UNIT/ML FlexPen, DIAL AND INJECT UNDER THE SKIN THREE TIMES DAILY PER CORRECTION SCALE. MAX DAILY DOSE 50 UNITS (Patient not taking: Reported on 04/04/2023), Disp: 15 mL, Rfl: 0    Insulin Glargine (BASAGLAR KWIKPEN) 100 UNIT/ML, Inject 40 Units into the skin at bedtime., Disp: 45 mL, Rfl: 2   insulin lispro (HUMALOG KWIKPEN) 100 UNIT/ML KwikPen, Take HUmalog 8 units with each meal PLUS Humalog correctional insulin scale max daily dose 50 units, Disp: 30 mL, Rfl: 3   Insulin Lispro (HUMALOG [place]), Inject 8 Units into the skin in the morning, at noon, and at bedtime., Disp: , Rfl:    Insulin Pen Needle 32G X 4 MM MISC, 1 Device by Does not apply route in the morning, at noon, in the evening, and at bedtime., Disp: 400 each, Rfl: 3   Lancets (ONETOUCH DELICA PLUS LANCET33G) MISC, USE   TO CHECK GLUCOSE 4 TIMES DAILY, Disp: 100 each, Rfl: 3   latanoprost (XALATAN) 0.005 % ophthalmic solution, Place 1 drop into both eyes at bedtime., Disp: , Rfl:    levothyroxine (SYNTHROID, LEVOTHROID) 75 MCG tablet, Take 75 mcg by mouth daily before breakfast., Disp: , Rfl:    LUMIGAN 0.01 % SOLN, Place 1 drop into both eyes at bedtime., Disp: , Rfl:    Multiple Vitamins-Minerals (ALIVE ONCE DAILY WOMENS 50+ PO), Take 1 tablet by mouth daily., Disp: , Rfl:    ondansetron (ZOFRAN) 4 MG tablet, Take 1 tablet (4 mg total) by mouth every 8 (eight) hours as needed for nausea or vomiting., Disp: 20 tablet, Rfl: 0   spironolactone (ALDACTONE) 25 MG tablet, Take 1 tablet (25 mg total) by mouth daily., Disp: 90 tablet, Rfl: 3   torsemide (DEMADEX) 20 MG tablet, Take 2 tablets (40 mg total) by mouth daily., Disp: 60 tablet, Rfl: 11   Travoprost, BAK Free, (TRAVATAN) 0.004 % SOLN ophthalmic solution, Place 1 drop into both eyes at bedtime., Disp: , Rfl:   Social History   Tobacco Use  Smoking Status Former   Current packs/day: 0.00   Average packs/day: 1 pack/day for 22.0 years (22.0 ttl pk-yrs)   Types: Cigarettes   Start date: 48   Quit date: 14   Years since quitting: 41.9  Smokeless Tobacco Never    Allergies  Allergen Reactions   Codeine Other (See Comments)   Other Other (See Comments)     Blood Product Refusal (Jehovah's witness)     Tape Other (See Comments)    Leaves blisters and marks on the skin   Sulfa Antibiotics Rash   Uloric [Febuxostat] Rash   Objective:  Vascular: DP/PT pulses 2/4 bilateral. CFT <3 seconds. Absent hair growth on digits. Edema noted to bilateral lower extremities. Xerosis noted bilaterally.  Skin. No lacerations or abrasions bilateral feet. Nails 1-5 bilateral  are thickened discolored and elongated with subungual  debris. Left second digit amputation . Left hallux with hyperkeratosis noted and upon debridedment ulceration noted to plantar hallux with granular base. No erythema edema or purulence noted. No probe to bone Musculoskeletal: MMT 5/5 bilateral lower extremities in DF, PF, Inversion and Eversion. Deceased ROM in DF of ankle joint. Left second digit amputation.  Neurological: Sensation intact to light touch. Protective sensation diminished bilateral.   Assessment:   1. Skin ulcer of great toe, left, with fat layer exposed (HCC)   2. Type II diabetes mellitus with peripheral circulatory disorder [facility])      Plan:  Patient was evaluated and treated and all questions answered. Ulcer left hallux with fat layer exposed  -Debridement as below. -Dressed with betadine, DSD. -Off-loading with surgical shoe. Added impression to offfload toe more.  -No abx indicated.  -Discussed glucose control and proper protein-rich diet.  -Discussed if any worsening redness, pain, fever or chills to call or may need to report to the emergency room. Patient expressed understanding.   Procedure: Excisional Debridement of Wound Rationale: Removal of non-viable soft tissue from the wound to promote healing.  Anesthesia: none Pre-Debridement Wound Measurements: Overlying callus  Post-Debridement Wound Measurements: 0.4 cm x 0.2 cm x 0.2 cm  Type of Debridement: Sharp Excisional Tissue Removed: Non-viable soft tissue Depth of Debridement: subcutaneous  tissue. Technique: Sharp excisional debridement to bleeding, viable wound base.  Dressing: Dry, sterile, compression dressing. Disposition: Patient tolerated procedure well. Patient to return in 2 week for follow-up.  No follow-ups on file.   No follow-ups on file.

## 2023-04-18 ENCOUNTER — Other Ambulatory Visit (HOSPITAL_COMMUNITY): Payer: Self-pay | Admitting: Emergency Medicine

## 2023-04-18 NOTE — Progress Notes (Unsigned)
Paramedicine Encounter    Patient ID: Danielle [name], female    DOB: [birth year], 77 y.o.   MRN: [id number]   Complaints - none - good report for healing from TFA  Assessment - lung sounds clear, NO edema  Compliance with meds - missed 1x levothroxine, 1x am dose, 2x ferrous sulfate, 4 evening doses in 2x weeks   Pill box filled - for 2x weeks   Refills needed - allopurinol, levothroxine, jardiance, multivitamin.   Meds changes since last visit - none    Social changes - brother moving in this weekend   I met with Danielle [name] in her home today. Pt reported that she has been preparing for her brother's arrival. Pt had some confusion with the 2x week box of medications. Pt denied any symptoms or problems over the last 2x weeks. Due to holiday, pt's pill box was filled for 2x weeks. Will follow up in 2x weeks.   BP 130/66   Pulse 68   Resp 16   Wt 196 lb (88.9 kg)   SpO2 97%   BMI 33.64 kg/m  Weight yesterday- 195 lbs Last visit weight-192 lbs  Cbg [address]  [place] EMT-P Community Paramedic  [phone]    ACTION: Home visit completed     Patient Care Team: Renford Dills, MD as PCP - General (Internal Medicine) Regan Lemming, MD as PCP - Electrophysiology (Cardiology) Quintella Reichert, MD as PCP - Sleep Medicine (Cardiology) Bensimhon, Bevelyn Buckles, MD as PCP - Cardiology (Cardiology) Burna Sis, LCSW as Social Worker (Licensed Clinical Social Worker) Shamleffer, Konrad Dolores, MD as Consulting Physician (Endocrinology)  Patient Active Problem List   Diagnosis Date Noted   Chronic systolic heart failure (HCC) 05/08/2022   Type 2 diabetes mellitus with stage 3a chronic kidney disease, with long-term current use of insulin (HCC) 02/16/2022   Prosthetic joint implant failure, initial encounter (HCC) 12/20/2021   Pseudophakia of both eyes 11/15/2021   DVT (deep venous thrombosis) (HCC) 07/31/2021   Acute blood loss anemia    Septic Arthritis, Infection of  prosthetic right knee joint (HCC), MSSA infection 07/10/2021   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) 07/10/2021   CAD (coronary artery disease) 06/08/2021   Carotid artery disease (HCC) 06/08/2021   Symptomatic bradycardia 06/08/2021   Paroxysmal atrial fibrillation (HCC) 06/08/2021   CKD (chronic kidney disease), stage III (HCC) 06/08/2021   Polyneuropathy associated with underlying disease (HCC) 05/04/2020   Severe nonproliferative diabetic retinopathy of left eye, with macular edema, associated with type 2 diabetes mellitus (HCC) 11/05/2019   Severe nonproliferative diabetic retinopathy of right eye, with macular edema, associated with type 2 diabetes mellitus (HCC) 11/05/2019   Retinal hemorrhage of right eye 11/05/2019   Retinal hemorrhage of left eye 11/05/2019   Diabetes mellitus (HCC) 05/14/2019   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, with long-term current use of insulin (HCC) 05/14/2019   Type 2 diabetes mellitus with diabetic polyneuropathy, with long-term current use of insulin (HCC) 05/14/2019   History of gout 05/20/2018   Primary localized osteoarthritis of right knee 05/13/2018   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) 12/20/2017   Obesity (BMI 30-39.9) 11/11/2017   OSA (obstructive sleep apnea) 02/18/2017   Essential hypertension    L Breast Cancer    Hypothyroidism     Current Outpatient Medications:    acetaminophen (TYLENOL) 500 MG tablet, Take 500-1,000 mg by mouth every 6 (six) hours as needed (for pain or headaches)., Disp: , Rfl:  allopurinol (ZYLOPRIM) 100 MG tablet, Take 100 mg by mouth daily., Disp: , Rfl:    apixaban (ELIQUIS) 5 MG TABS tablet, Take 1 tablet (5 mg total) by mouth 2 (two) times daily., Disp: 60 tablet, Rfl: 11   aspirin EC 81 MG tablet, Take 1 tablet (81 mg total) by mouth daily. Swallow whole., Disp: 360 tablet, Rfl: 0   atorvastatin (LIPITOR) 80 MG tablet, TAKE 1 TABLET BY MOUTH AT BEDTIME, Disp: 90  tablet, Rfl: 3   B Complex-Biotin-FA (B-COMPLEX PO), Take 1 tablet by mouth daily., Disp: , Rfl:    Blood Glucose Monitoring Suppl (ONETOUCH VERIO) w/Device KIT, Use to check blood sugar, Disp: 1 kit, Rfl: 0   carvedilol (COREG) 3.125 MG tablet, Take 1 tablet (3.125 mg total) by mouth 2 (two) times daily with a meal., Disp: 180 tablet, Rfl: 3   clotrimazole-betamethasone (LOTRISONE) lotion, Apply 1 application topically 2 (two) times daily as needed (itching)., Disp: , Rfl:    empagliflozin (JARDIANCE) 10 MG TABS tablet, Take 1 tablet (10 mg total) by mouth daily., Disp: 30 tablet, Rfl: 11   ferrous sulfate 325 (65 FE) MG tablet, Take 325 mg by mouth 3 (three) times a week., Disp: , Rfl:    folic acid (FOLVITE) 1 MG tablet, Take 1 tablet by mouth once daily, Disp: 90 tablet, Rfl: 3   gabapentin (NEURONTIN) 300 MG capsule, Take 300 mg by mouth 2 (two) times daily., Disp: , Rfl:    glucose blood (ONETOUCH VERIO) test strip, Check blood sugar 2 times daily, Disp: 100 each, Rfl: 12   Insulin Glargine (BASAGLAR KWIKPEN) 100 UNIT/ML, Inject 40 Units into the skin at bedtime., Disp: 45 mL, Rfl: 2   insulin lispro (HUMALOG KWIKPEN) 100 UNIT/ML KwikPen, Take HUmalog 8 units with each meal PLUS Humalog correctional insulin scale max daily dose 50 units, Disp: 30 mL, Rfl: 3   Insulin Lispro (HUMALOG [place]), Inject 8 Units into the skin in the morning, at noon, and at bedtime., Disp: , Rfl:    Insulin Pen Needle 32G X 4 MM MISC, 1 Device by Does not apply route in the morning, at noon, in the evening, and at bedtime., Disp: 400 each, Rfl: 3   Lancets (ONETOUCH DELICA PLUS LANCET33G) MISC, USE   TO CHECK GLUCOSE 4 TIMES DAILY, Disp: 100 each, Rfl: 3   latanoprost (XALATAN) 0.005 % ophthalmic solution, Place 1 drop into both eyes at bedtime., Disp: , Rfl:    levothyroxine (SYNTHROID, LEVOTHROID) 75 MCG tablet, Take 75 mcg by mouth daily before breakfast., Disp: , Rfl:    LUMIGAN 0.01 % SOLN, Place 1 drop into both  eyes at bedtime., Disp: , Rfl:    Multiple Vitamins-Minerals (ALIVE ONCE DAILY WOMENS 50+ PO), Take 1 tablet by mouth daily., Disp: , Rfl:    ondansetron (ZOFRAN) 4 MG tablet, Take 1 tablet (4 mg total) by mouth every 8 (eight) hours as needed for nausea or vomiting., Disp: 20 tablet, Rfl: 0   spironolactone (ALDACTONE) 25 MG tablet, Take 1 tablet (25 mg total) by mouth daily., Disp: 90 tablet, Rfl: 3   torsemide (DEMADEX) 20 MG tablet, Take 2 tablets (40 mg total) by mouth daily., Disp: 60 tablet, Rfl: 11   Travoprost, BAK Free, (TRAVATAN) 0.004 % SOLN ophthalmic solution, Place 1 drop into both eyes at bedtime., Disp: , Rfl:    Biotin 10 MG TABS, Take 10 mg by mouth daily. (Patient not taking: Reported on 04/04/2023), Disp: , Rfl:    Cinnamon  500 MG capsule, Take 500 mg by mouth daily. (Patient not taking: Reported on 04/18/2023), Disp: , Rfl:    Continuous Glucose Sensor (DEXCOM G6 SENSOR) MISC, 1 Device by Does not apply route as directed. (Patient not taking: Reported on 03/14/2023), Disp: 9 each, Rfl: 3   Continuous Glucose Transmitter (DEXCOM G6 TRANSMITTER) MISC, Change every 90 days (Patient not taking: Reported on 03/14/2023), Disp: 1 each, Rfl: 3   insulin aspart (NOVOLOG FLEXPEN) 100 UNIT/ML FlexPen, DIAL AND INJECT UNDER THE SKIN THREE TIMES DAILY PER CORRECTION SCALE. MAX DAILY DOSE 50 UNITS (Patient not taking: Reported on 04/04/2023), Disp: 15 mL, Rfl: 0 Allergies  Allergen Reactions   Codeine Other (See Comments)   Other Other (See Comments)    Blood Product Refusal (Jehovah's witness)     Tape Other (See Comments)    Leaves blisters and marks on the skin   Sulfa Antibiotics Rash   Uloric [Febuxostat] Rash     Social History   Socioeconomic History   Marital status: Widowed    Spouse name: Not on file   Number of children: 3   Years of education: Not on file   Highest education level: Some college, no degree  Occupational History   Occupation: retired  Tobacco Use    Smoking status: Former    Current packs/day: 0.00    Average packs/day: 1 pack/day for 22.0 years (22.0 ttl pk-yrs)    Types: Cigarettes    Start date: 84    Quit date: 3    Years since quitting: 41.9   Smokeless tobacco: Never  Vaping Use   Vaping status: Never Used  Substance and Sexual Activity   Alcohol use: Yes    Comment: 05/22/2018 "glass of wine couple times/wk"   Drug use: Not Currently   Sexual activity: Not Currently    Birth control/protection: None, Post-menopausal  Other Topics Concern   Not on file  Social History Narrative   Not on file   Social Determinants of Health   Financial Resource Strain: Medium Risk (12/22/2021)   Overall Financial Resource Strain (CARDIA)    Difficulty of Paying Living Expenses: Somewhat hard  Food Insecurity: No Food Insecurity (05/08/2018)   Hunger Vital Sign    Worried About Running Out of Food in the Last Year: Never true    Ran Out of Food in the Last Year: Never true  Transportation Needs: Unmet Transportation Needs (05/08/2022)   PRAPARE - Administrator, Civil Service (Medical): Yes    Lack of Transportation (Non-Medical): Yes  Physical Activity: Not on file  Stress: Not on file  Social Connections: Not on file  Intimate Partner Violence: Not on file    Physical Exam      Future Appointments  Date Time Provider Department Center  05/01/2023  1:45 PM Louann Sjogren, DPM [facility]  05/17/2023 11:00 AM [facility] ECHO OP 1 [facility]  05/17/2023 12:00 PM Bensimhon, Bevelyn Buckles, MD [facility] None  06/07/2023  7:05 AM [facility] DEVICE REMOTES [facility]  07/17/2023 10:00 AM [facility] HS VASC 6 [facility]  07/17/2023 10:30 AM [facility] PA [facility]  09/06/2023  7:05 AM [facility] DEVICE REMOTES [facility]  12/06/2023  7:05 AM [facility] DEVICE REMOTES [facility]  03/06/2024  7:05 AM [facility] DEVICE REMOTES [facility]  06/05/2024  7:05 AM  [facility] DEVICE REMOTES [facility]  09/04/2024  7:05 AM [facility] DEVICE REMOTES [facility]  12/04/2024  7:05 AM [facility] DEVICE REMOTES [facility]  03/05/2025  7:05 AM [facility] DEVICE REMOTES [facility]  06/04/2025  7:05 AM [facility] DEVICE REMOTES [facility]  09/03/2025  7:05 AM [facility] DEVICE REMOTES [facility]  12/03/2025  7:05 AM [facility] DEVICE REMOTES [facility]

## 2023-04-23 ENCOUNTER — Telehealth (HOSPITAL_COMMUNITY): Payer: Self-pay | Admitting: Cardiology

## 2023-04-23 MED ORDER — EMPAGLIFLOZIN 10 MG PO TABS: 10.0000 mg | ORAL_TABLET | Freq: Every day | ORAL | 11 refills | Status: DC

## 2023-04-23 NOTE — Telephone Encounter (Signed)
-----   Message from [facility] [name] D sent at 04/23/2023  9:34 AM EST ----- Regarding: Jardiance refill Hi,  Can we refill Danielle [name]'s Jardiance at [facility] on [address].   Thanks,  [name].

## 2023-05-01 ENCOUNTER — Encounter: Payer: Self-pay | Admitting: Podiatry

## 2023-05-01 ENCOUNTER — Ambulatory Visit: Payer: Medicare HMO | Admitting: Podiatry

## 2023-05-01 DIAGNOSIS — L97521 Non-pressure chronic ulcer of other part of left foot limited to breakdown of skin: Secondary | ICD-10-CM

## 2023-05-01 DIAGNOSIS — E1151 Type 2 diabetes mellitus with diabetic peripheral angiopathy without gangrene: Secondary | ICD-10-CM

## 2023-05-01 NOTE — Progress Notes (Signed)
Subjective:  Patient ID: Danielle [name], female    DOB: 1946/05/17,  MRN: [id number]  No chief complaint on file.   77 y.o. female returns for follow-up of left great toe wound.  Relates doing well around the second toe. Relates wound to left hallux is improving and doing well. Relates she has not been wearing the surgical shoe around the house. Has been dressing as instructed   Review of Systems: Negative except as noted in the HPI. Denies N/V/F/Ch.  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HCC) 02/05/2017   Acute gout due to renal impairment involving right wrist 05/20/2018   Acute kidney failure (HCC) 05/15/2018   Arthritis    "all over" (05/22/2018)   Blood dyscrasia    per pt-has small blood cells-appears as if anemic   Breast cancer, left breast (HCC) 1985   CHF (congestive heart failure) (HCC)    Complication of anesthesia    difficult to awaken from per pt   Coronary artery disease    Dyspnea    Essential hypertension    Heart disease    Heart murmur    History of gout    Hypothyroidism    Obesity (BMI 30-39.9) 11/11/2017   OSA (obstructive sleep apnea) 02/18/2017    severe obstructive sleep apnea with an AHI of 75.6/h and mild central sleep apnea with a CAI of 7.7/h.  Oxygen saturations dropped as low as 82%.   He is on CPAP at 9 cm H2O.   OSA on CPAP    Personal history of chemotherapy    Personal history of radiation therapy    Presence of permanent cardiac pacemaker    Primary localized osteoarthritis of right knee 05/13/2018   Refusal of blood transfusions as patient is Jehovah's Witness    Type II diabetes mellitus (HCC)     Current Outpatient Medications:    acetaminophen (TYLENOL) 500 MG tablet, Take 500-1,000 mg by mouth every 6 (six) hours as needed (for pain or headaches)., Disp: , Rfl:    allopurinol (ZYLOPRIM) 100 MG tablet, Take 100 mg by mouth daily., Disp: , Rfl:    apixaban (ELIQUIS) 5  MG TABS tablet, Take 1 tablet (5 mg total) by mouth 2 (two) times daily., Disp: 60 tablet, Rfl: 11   aspirin EC 81 MG tablet, Take 1 tablet (81 mg total) by mouth daily. Swallow whole., Disp: 360 tablet, Rfl: 0   atorvastatin (LIPITOR) 80 MG tablet, TAKE 1 TABLET BY MOUTH AT BEDTIME, Disp: 90 tablet, Rfl: 3   B Complex-Biotin-FA (B-COMPLEX PO), Take 1 tablet by mouth daily., Disp: , Rfl:    Biotin 10 MG TABS, Take 10 mg by mouth daily. (Patient not taking: Reported on 04/04/2023), Disp: , Rfl:    Blood Glucose Monitoring Suppl (ONETOUCH VERIO) w/Device KIT, Use to check blood sugar, Disp: 1 kit, Rfl: 0   carvedilol (COREG) 3.125 MG tablet, Take 1 tablet (3.125 mg total) by mouth 2 (two) times daily with a meal., Disp: 180 tablet, Rfl: 3   Cinnamon 500 MG capsule, Take 500 mg by mouth daily. (Patient not taking: Reported on 04/18/2023), Disp: , Rfl:    clotrimazole-betamethasone (LOTRISONE) lotion, Apply 1 application topically 2 (two) times daily as needed (itching)., Disp: , Rfl:    Continuous Glucose Sensor (DEXCOM G6 SENSOR) MISC, 1 Device by Does not apply route as directed. (Patient not taking: Reported on 03/14/2023), Disp: 9 each, Rfl: 3   Continuous Glucose Transmitter (  DEXCOM G6 TRANSMITTER) MISC, Change every 90 days (Patient not taking: Reported on 03/14/2023), Disp: 1 each, Rfl: 3   empagliflozin (JARDIANCE) 10 MG TABS tablet, Take 1 tablet (10 mg total) by mouth daily., Disp: 30 tablet, Rfl: 11   ferrous sulfate 325 (65 FE) MG tablet, Take 325 mg by mouth 3 (three) times a week., Disp: , Rfl:    folic acid (FOLVITE) 1 MG tablet, Take 1 tablet by mouth once daily, Disp: 90 tablet, Rfl: 3   gabapentin (NEURONTIN) 300 MG capsule, Take 300 mg by mouth 2 (two) times daily., Disp: , Rfl:    glucose blood (ONETOUCH VERIO) test strip, Check blood sugar 2 times daily, Disp: 100 each, Rfl: 12   insulin aspart (NOVOLOG FLEXPEN) 100 UNIT/ML FlexPen, DIAL AND INJECT UNDER THE SKIN THREE TIMES DAILY  PER CORRECTION SCALE. MAX DAILY DOSE 50 UNITS (Patient not taking: Reported on 04/04/2023), Disp: 15 mL, Rfl: 0   Insulin Glargine (BASAGLAR KWIKPEN) 100 UNIT/ML, Inject 40 Units into the skin at bedtime., Disp: 45 mL, Rfl: 2   insulin lispro (HUMALOG KWIKPEN) 100 UNIT/ML KwikPen, Take HUmalog 8 units with each meal PLUS Humalog correctional insulin scale max daily dose 50 units, Disp: 30 mL, Rfl: 3   Insulin Lispro (HUMALOG [place]), Inject 8 Units into the skin in the morning, at noon, and at bedtime., Disp: , Rfl:    Insulin Pen Needle 32G X 4 MM MISC, 1 Device by Does not apply route in the morning, at noon, in the evening, and at bedtime., Disp: 400 each, Rfl: 3   Lancets (ONETOUCH DELICA PLUS LANCET33G) MISC, USE   TO CHECK GLUCOSE 4 TIMES DAILY, Disp: 100 each, Rfl: 3   latanoprost (XALATAN) 0.005 % ophthalmic solution, Place 1 drop into both eyes at bedtime., Disp: , Rfl:    levothyroxine (SYNTHROID, LEVOTHROID) 75 MCG tablet, Take 75 mcg by mouth daily before breakfast., Disp: , Rfl:    LUMIGAN 0.01 % SOLN, Place 1 drop into both eyes at bedtime., Disp: , Rfl:    Multiple Vitamins-Minerals (ALIVE ONCE DAILY WOMENS 50+ PO), Take 1 tablet by mouth daily., Disp: , Rfl:    ondansetron (ZOFRAN) 4 MG tablet, Take 1 tablet (4 mg total) by mouth every 8 (eight) hours as needed for nausea or vomiting., Disp: 20 tablet, Rfl: 0   spironolactone (ALDACTONE) 25 MG tablet, Take 1 tablet (25 mg total) by mouth daily., Disp: 90 tablet, Rfl: 3   torsemide (DEMADEX) 20 MG tablet, Take 2 tablets (40 mg total) by mouth daily., Disp: 60 tablet, Rfl: 11   Travoprost, BAK Free, (TRAVATAN) 0.004 % SOLN ophthalmic solution, Place 1 drop into both eyes at bedtime., Disp: , Rfl:   Social History   Tobacco Use  Smoking Status Former   Current packs/day: 0.00   Average packs/day: 1 pack/day for 22.0 years (22.0 ttl pk-yrs)   Types: Cigarettes   Start date: 59   Quit date: 42   Years since quitting: 41.9   Smokeless Tobacco Never    Allergies  Allergen Reactions   Codeine Other (See Comments)   Other Other (See Comments)    Blood Product Refusal (Jehovah's witness)     Tape Other (See Comments)    Leaves blisters and marks on the skin   Sulfa Antibiotics Rash   Uloric [Febuxostat] Rash   Objective:  Vascular: DP/PT pulses 2/4 bilateral. CFT <3 seconds. Absent hair growth on digits. Edema noted to bilateral lower extremities. Xerosis noted bilaterally.  Skin. No lacerations or abrasions bilateral feet. Nails 1-5 bilateral  are thickened discolored and elongated with subungual debris. Left second digit amputation . Left hallux with hyperkeratosis noted and upon debridedment ulceration noted to plantar hallux with granular base. No erythema edema or purulence noted. No probe to bone Musculoskeletal: MMT 5/5 bilateral lower extremities in DF, PF, Inversion and Eversion. Deceased ROM in DF of ankle joint. Left second digit amputation.  Neurological: Sensation intact to light touch. Protective sensation diminished bilateral.   Assessment:   1. Skin ulcer of left great toe, limited to breakdown of skin (HCC)   2. Type II diabetes mellitus with peripheral circulatory disorder [facility])       Plan:  Patient was evaluated and treated and all questions answered. Ulcer left hallux limited to skin breakdown.  -Debridement as below. -Dressed with betadine, DSD. -Off-loading with surgical shoe. Added impression to offfload toe more.  -No abx indicated.  -Discussed glucose control and proper protein-rich diet.  -Discussed if any worsening redness, pain, fever or chills to call or may need to report to the emergency room. Patient expressed understanding.   Procedure: Excisional Debridement of Wound Rationale: Removal of non-viable soft tissue from the wound to promote healing.  Anesthesia: none Pre-Debridement Wound Measurements: Overlying callus  Post-Debridement Wound Measurements: 0.3 cm x  0.2 cm x 0.1 cm  Type of Debridement: Sharp Excisional Tissue Removed: Non-viable soft tissue Depth of Debridement: subcutaneous tissue. Technique: Sharp excisional debridement to bleeding, viable wound base.  Dressing: Dry, sterile, compression dressing. Disposition: Patient tolerated procedure well. Patient to return in 2 week for follow-up.  Return in about 2 weeks (around 05/15/2023) for wound check.   Return in about 2 weeks (around 05/15/2023) for wound check.

## 2023-05-02 ENCOUNTER — Other Ambulatory Visit (HOSPITAL_COMMUNITY): Payer: Self-pay | Admitting: Emergency Medicine

## 2023-05-02 NOTE — Progress Notes (Signed)
Paramedicine Encounter    Patient ID: Danielle [name], female    DOB: 1945-09-16, 77 y.o.   MRN: [id number]   Complaints - ear fungus   Assessment - lung sounds clear.   Compliance with meds - missed 3 levothroxine doses  Pill box filled - for 1 week  Refills needed - biotin, spirnolactone, multivitamin, cinnamon, torsemide, eliqui, baslagar   Meds changes since last visit - none    Social changes - brother and sister in law moved in    VISIT SUMMARY**  I met with Danielle [name] in the home. Pt advised that she had a good holiday with her family and reported that she was feeling well. Pt went to the podiatrist and was advised to wear her boot around the house to promote quicker healing. Pt was being compliant with same. Pt was assessed as noted with no heart failure complaints. I assisted in confirming her Ophthalmologist appointment as her eyes were bothering her and pt advised that she would like an ENT or dermatologist after her eye appointment. Pt reported that her brother and sister in law moved in over the holiday weekend and pt advised that the dynamic has been very positive. Pt seemed calm and at ease with the transition. Pt reported increased compliance with daily blood sugars and presented her glucometer with consistent readings in the mid 150s. Pt's pill box was filled for 1x week. Upcoming appointments were reviewed. Will follow up in 1x week.   BP 98/62   Pulse 72   Resp 16   Wt 200 lb (90.7 kg)   BMI 34.33 kg/m  Weight yesterday-DNW Last visit weight-196 lbs  Cbg 125   [place] [facility] EMT-P Community Paramedic  [phone]     ACTION: Home visit completed     Patient Care Team: Renford Dills, MD as PCP - General (Internal Medicine) Regan Lemming, MD as PCP - Electrophysiology (Cardiology) Quintella Reichert, MD as PCP - Sleep Medicine (Cardiology) Bensimhon, Bevelyn Buckles, MD as PCP - Cardiology (Cardiology) Burna Sis, LCSW as Social Worker (Licensed  Clinical Social Worker) Shamleffer, Konrad Dolores, MD as Consulting Physician (Endocrinology)  Patient Active Problem List   Diagnosis Date Noted   Chronic systolic heart failure (HCC) 05/08/2022   Type 2 diabetes mellitus with stage 3a chronic kidney disease, with long-term current use of insulin (HCC) 02/16/2022   Prosthetic joint implant failure, initial encounter (HCC) 12/20/2021   Pseudophakia of both eyes 11/15/2021   DVT (deep venous thrombosis) (HCC) 07/31/2021   Acute blood loss anemia    Septic Arthritis, Infection of prosthetic right knee joint (HCC), MSSA infection 07/10/2021   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) 07/10/2021   CAD (coronary artery disease) 06/08/2021   Carotid artery disease (HCC) 06/08/2021   Symptomatic bradycardia 06/08/2021   Paroxysmal atrial fibrillation (HCC) 06/08/2021   CKD (chronic kidney disease), stage III (HCC) 06/08/2021   Polyneuropathy associated with underlying disease (HCC) 05/04/2020   Severe nonproliferative diabetic retinopathy of left eye, with macular edema, associated with type 2 diabetes mellitus (HCC) 11/05/2019   Severe nonproliferative diabetic retinopathy of right eye, with macular edema, associated with type 2 diabetes mellitus (HCC) 11/05/2019   Retinal hemorrhage of right eye 11/05/2019   Retinal hemorrhage of left eye 11/05/2019   Diabetes mellitus (HCC) 05/14/2019   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, with long-term current use of insulin (HCC) 05/14/2019   Type 2 diabetes mellitus with diabetic polyneuropathy, with long-term current use of insulin (HCC)  05/14/2019   History of gout 05/20/2018   Primary localized osteoarthritis of right knee 05/13/2018   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) 12/20/2017   Obesity (BMI 30-39.9) 11/11/2017   OSA (obstructive sleep apnea) 02/18/2017   Essential hypertension    L Breast Cancer    Hypothyroidism     Current Outpatient  Medications:    acetaminophen (TYLENOL) 500 MG tablet, Take 500-1,000 mg by mouth every 6 (six) hours as needed (for pain or headaches)., Disp: , Rfl:    allopurinol (ZYLOPRIM) 100 MG tablet, Take 100 mg by mouth daily., Disp: , Rfl:    apixaban (ELIQUIS) 5 MG TABS tablet, Take 1 tablet (5 mg total) by mouth 2 (two) times daily., Disp: 60 tablet, Rfl: 11   aspirin EC 81 MG tablet, Take 1 tablet (81 mg total) by mouth daily. Swallow whole., Disp: 360 tablet, Rfl: 0   atorvastatin (LIPITOR) 80 MG tablet, TAKE 1 TABLET BY MOUTH AT BEDTIME, Disp: 90 tablet, Rfl: 3   B Complex-Biotin-FA (B-COMPLEX PO), Take 1 tablet by mouth daily., Disp: , Rfl:    Blood Glucose Monitoring Suppl (ONETOUCH VERIO) w/Device KIT, Use to check blood sugar, Disp: 1 kit, Rfl: 0   carvedilol (COREG) 3.125 MG tablet, Take 1 tablet (3.125 mg total) by mouth 2 (two) times daily with a meal., Disp: 180 tablet, Rfl: 3   clotrimazole-betamethasone (LOTRISONE) lotion, Apply 1 application topically 2 (two) times daily as needed (itching)., Disp: , Rfl:    empagliflozin (JARDIANCE) 10 MG TABS tablet, Take 1 tablet (10 mg total) by mouth daily., Disp: 30 tablet, Rfl: 11   ferrous sulfate 325 (65 FE) MG tablet, Take 325 mg by mouth 3 (three) times a week., Disp: , Rfl:    folic acid (FOLVITE) 1 MG tablet, Take 1 tablet by mouth once daily, Disp: 90 tablet, Rfl: 3   gabapentin (NEURONTIN) 300 MG capsule, Take 300 mg by mouth 2 (two) times daily., Disp: , Rfl:    glucose blood (ONETOUCH VERIO) test strip, Check blood sugar 2 times daily, Disp: 100 each, Rfl: 12   Insulin Glargine (BASAGLAR KWIKPEN) 100 UNIT/ML, Inject 40 Units into the skin at bedtime., Disp: 45 mL, Rfl: 2   insulin lispro (HUMALOG KWIKPEN) 100 UNIT/ML KwikPen, Take HUmalog 8 units with each meal PLUS Humalog correctional insulin scale max daily dose 50 units, Disp: 30 mL, Rfl: 3   Insulin Lispro (HUMALOG [place]), Inject 8 Units into the skin in the morning, at noon, and at  bedtime., Disp: , Rfl:    Insulin Pen Needle 32G X 4 MM MISC, 1 Device by Does not apply route in the morning, at noon, in the evening, and at bedtime., Disp: 400 each, Rfl: 3   Lancets (ONETOUCH DELICA PLUS LANCET33G) MISC, USE   TO CHECK GLUCOSE 4 TIMES DAILY, Disp: 100 each, Rfl: 3   latanoprost (XALATAN) 0.005 % ophthalmic solution, Place 1 drop into both eyes at bedtime., Disp: , Rfl:    levothyroxine (SYNTHROID, LEVOTHROID) 75 MCG tablet, Take 75 mcg by mouth daily before breakfast., Disp: , Rfl:    LUMIGAN 0.01 % SOLN, Place 1 drop into both eyes at bedtime., Disp: , Rfl:    Multiple Vitamins-Minerals (ALIVE ONCE DAILY WOMENS 50+ PO), Take 1 tablet by mouth daily., Disp: , Rfl:    ondansetron (ZOFRAN) 4 MG tablet, Take 1 tablet (4 mg total) by mouth every 8 (eight) hours as needed for nausea or vomiting., Disp: 20 tablet, Rfl: 0  spironolactone (ALDACTONE) 25 MG tablet, Take 1 tablet (25 mg total) by mouth daily., Disp: 90 tablet, Rfl: 3   torsemide (DEMADEX) 20 MG tablet, Take 2 tablets (40 mg total) by mouth daily., Disp: 60 tablet, Rfl: 11   Travoprost, BAK Free, (TRAVATAN) 0.004 % SOLN ophthalmic solution, Place 1 drop into both eyes at bedtime., Disp: , Rfl:    Biotin 10 MG TABS, Take 10 mg by mouth daily. (Patient not taking: Reported on 04/04/2023), Disp: , Rfl:    Cinnamon 500 MG capsule, Take 500 mg by mouth daily. (Patient not taking: Reported on 04/18/2023), Disp: , Rfl:    Continuous Glucose Sensor (DEXCOM G6 SENSOR) MISC, 1 Device by Does not apply route as directed. (Patient not taking: Reported on 03/14/2023), Disp: 9 each, Rfl: 3   Continuous Glucose Transmitter (DEXCOM G6 TRANSMITTER) MISC, Change every 90 days (Patient not taking: Reported on 03/14/2023), Disp: 1 each, Rfl: 3   insulin aspart (NOVOLOG FLEXPEN) 100 UNIT/ML FlexPen, DIAL AND INJECT UNDER THE SKIN THREE TIMES DAILY PER CORRECTION SCALE. MAX DAILY DOSE 50 UNITS (Patient not taking: Reported on 04/04/2023), Disp: 15  mL, Rfl: 0 Allergies  Allergen Reactions   Codeine Other (See Comments)   Other Other (See Comments)    Blood Product Refusal (Jehovah's witness)     Tape Other (See Comments)    Leaves blisters and marks on the skin   Sulfa Antibiotics Rash   Uloric [Febuxostat] Rash     Social History   Socioeconomic History   Marital status: Widowed    Spouse name: Not on file   Number of children: 3   Years of education: Not on file   Highest education level: Some college, no degree  Occupational History   Occupation: retired  Tobacco Use   Smoking status: Former    Current packs/day: 0.00    Average packs/day: 1 pack/day for 22.0 years (22.0 ttl pk-yrs)    Types: Cigarettes    Start date: 46    Quit date: 69    Years since quitting: 41.9   Smokeless tobacco: Never  Vaping Use   Vaping status: Never Used  Substance and Sexual Activity   Alcohol use: Yes    Comment: 05/22/2018 "glass of wine couple times/wk"   Drug use: Not Currently   Sexual activity: Not Currently    Birth control/protection: None, Post-menopausal  Other Topics Concern   Not on file  Social History Narrative   Not on file   Social Determinants of Health   Financial Resource Strain: Medium Risk (12/22/2021)   Overall Financial Resource Strain (CARDIA)    Difficulty of Paying Living Expenses: Somewhat hard  Food Insecurity: No Food Insecurity (05/08/2018)   Hunger Vital Sign    Worried About Running Out of Food in the Last Year: Never true    Ran Out of Food in the Last Year: Never true  Transportation Needs: Unmet Transportation Needs (05/08/2022)   PRAPARE - Administrator, Civil Service (Medical): Yes    Lack of Transportation (Non-Medical): Yes  Physical Activity: Not on file  Stress: Not on file  Social Connections: Not on file  Intimate Partner Violence: Not on file    Physical Exam      Future Appointments  Date Time Provider Department Center  05/15/2023  2:00 PM  Louann Sjogren, DPM [facility]  05/17/2023 11:00 AM [facility] ECHO OP 1 [facility]  05/17/2023 12:00 PM Bensimhon, Bevelyn Buckles, MD [facility] None  06/07/2023  7:05 AM [facility] DEVICE REMOTES [facility]  07/17/2023 10:00 AM [facility] HS VASC 6 [facility]  07/17/2023 10:30 AM [facility]  09/06/2023  7:05 AM [facility] DEVICE REMOTES [facility]  12/06/2023  7:05 AM [facility] DEVICE REMOTES [facility]  03/06/2024  7:05 AM [facility] DEVICE REMOTES [facility]  06/05/2024  7:05 AM [facility] DEVICE REMOTES [facility]  09/04/2024  7:05 AM [facility] DEVICE REMOTES [facility]  12/04/2024  7:05 AM [facility] DEVICE REMOTES [facility]  03/05/2025  7:05 AM [facility] DEVICE REMOTES [facility]  06/04/2025  7:05 AM [facility] DEVICE REMOTES [facility]  09/03/2025  7:05 AM [facility] DEVICE REMOTES [facility]  12/03/2025  7:05 AM [facility] DEVICE REMOTES [facility]

## 2023-05-09 ENCOUNTER — Other Ambulatory Visit (HOSPITAL_COMMUNITY): Payer: Self-pay | Admitting: Emergency Medicine

## 2023-05-09 NOTE — Progress Notes (Signed)
Paramedicine Encounter    Patient ID: Danielle [name], female    DOB: 27-Sep-1945, 77 y.o.   MRN: [id number]   Complaints - fall from boot, worsening shoulder/neck pain.   Assessment - lung sounds clear. Bilateral pitting pedal edema, had compression socks on, unable to determine true pitting degree.   Compliance with meds - missed 2x evening doses  Pill box filled - 1x week  Refills needed - eliquis, cinnamon, biotin, multivitamin,   Meds changes since last visit - none    Social changes - none   VISIT SUMMARY**  I met with Danielle [name] in the home. Pt reported that she had a fall this past week when she got tripped up by her boot. Pt denied any injury from same. Pt was advised to use extra caution when wearing the boot as its ridged sole can be a trip hazzard. Pt also reported some increasing pain in her neck, right shoulder, and down her right arm. Pt advised that she has had the pain for an extended period of time, however has worsened. Pt advised that tylenol is not cutting it and had to take a left over oxycodone from her foot surgery to go to bed last night. Pt was advised to contact her PCP for further evaluation, treatment, and pain management of same. Pt was agreeable. Pt denies any shortness of breath, chest pain, palpitations, or dizziness. Pt did present with some pedal edema, but has lost 5 lbs and was wearing compression stockings. Pt 's pill box was filled for 1x week. Will follow up in the home next week. Pt advised of upcoming appointments.   BP 136/80   Pulse 62   Resp 16   Wt 195 lb (88.5 kg)   SpO2 91%   BMI 33.47 kg/m  Weight yesterday-196 lbs  Last visit weight-200 lbs   [place] [facility] EMT-P [facility] Par[facility]ic  [phone]    ACTION: Home visit completed     Patient Care Team: Renford Dills, MD as PCP - General (Internal Medicine) Regan Lemming, MD as PCP - Electrophysiology (Cardiology) Quintella Reichert, MD as PCP - Sleep Medicine  (Cardiology) Bensimhon, Bevelyn Buckles, MD as PCP - Cardiology (Cardiology) Burna Sis, LCSW as Social Worker (Licensed Clinical Social Worker) Shamleffer, Konrad Dolores, MD as Consulting Physician (Endocrinology)  Patient Active Problem List   Diagnosis Date Noted  . Chronic systolic heart failure (HCC) 05/08/2022  . Type 2 diabetes mellitus with stage 3a chronic kidney disease, with long-term current use of insulin (HCC) 02/16/2022  . Prosthetic joint implant failure, initial encounter (HCC) 12/20/2021  . Pseudophakia of both eyes 11/15/2021  . DVT (deep venous thrombosis) (HCC) 07/31/2021  . Acute blood loss anemia   . Septic Arthritis, Infection of prosthetic right knee joint (HCC), MSSA infection 07/10/2021  . 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 CAD (coronary artery disease) 06/08/2021  . Carotid artery disease (HCC) 06/08/2021  . Symptomatic bradycardia 06/08/2021  . Paroxysmal atrial fibrillation (HCC) 06/08/2021  . CKD (chronic kidney disease), stage III (HCC) 06/08/2021  . Polyneuropathy associated with underlying disease (HCC) 05/04/2020  . Severe nonproliferative diabetic retinopathy of left eye, with macular edema, associated with type 2 diabetes mellitus (HCC) 11/05/2019  . Severe nonproliferative diabetic retinopathy of right eye, with macular edema, associated with type 2 diabetes mellitus (HCC) 11/05/2019  . Retinal hemorrhage of right eye 11/05/2019  . Retinal hemorrhage of left eye 11/05/2019  . Diabetes mellitus (HCC) 05/14/2019  . Type 2  diabetes mellitus with proliferative retinopathy, with long-term current use of insulin (HCC) 05/14/2019  . Type 2 diabetes mellitus with diabetic polyneuropathy, with long-term current use of insulin (HCC) 05/14/2019  . History of gout 05/20/2018  . Primary localized osteoarthritis of right knee 05/13/2018  . 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 Obesity (BMI 30-39.9) 11/11/2017   . OSA (obstructive sleep apnea) 02/18/2017  . Essential hypertension   . L Breast Cancer   . Hypothyroidism     Current Outpatient Medications:  .  acetaminophen (TYLENOL) 500 MG tablet, Take 500-1,000 mg by mouth every 6 (six) hours as needed (for pain or headaches)., Disp: , Rfl:  .  allopurinol (ZYLOPRIM) 100 MG tablet, Take 100 mg by mouth daily., Disp: , Rfl:  .  apixaban (ELIQUIS) 5 MG TABS tablet, Take 1 tablet (5 mg total) by mouth 2 (two) times daily., Disp: 60 tablet, Rfl: 11 .  aspirin EC 81 MG tablet, Take 1 tablet (81 mg total) by mouth daily. Swallow whole., Disp: 360 tablet, Rfl: 0 .  atorvastatin (LIPITOR) 80 MG tablet, TAKE 1 TABLET BY MOUTH AT BEDTIME, Disp: 90 tablet, Rfl: 3 .  B Complex-Biotin-FA (B-COMPLEX PO), Take 1 tablet by mouth daily., Disp: , Rfl:  .  Blood Glucose Monitoring Suppl (ONETOUCH VERIO) w/Device KIT, Use to check blood sugar, Disp: 1 kit, Rfl: 0 .  carvedilol (COREG) 3.125 MG tablet, Take 1 tablet (3.125 mg total) by mouth 2 (two) times daily with a meal., Disp: 180 tablet, Rfl: 3 .  clotrimazole-betamethasone (LOTRISONE) lotion, Apply 1 application topically 2 (two) times daily as needed (itching)., Disp: , Rfl:  .  empagliflozin (JARDIANCE) 10 MG TABS tablet, Take 1 tablet (10 mg total) by mouth daily., Disp: 30 tablet, Rfl: 11 .  ferrous sulfate 325 (65 FE) MG tablet, Take 325 mg by mouth 3 (three) times a week., Disp: , Rfl:  .  folic acid (FOLVITE) 1 MG tablet, Take 1 tablet by mouth once daily, Disp: 90 tablet, Rfl: 3 .  gabapentin (NEURONTIN) 300 MG capsule, Take 300 mg by mouth 2 (two) times daily., Disp: , Rfl:  .  glucose blood (ONETOUCH VERIO) test strip, Check blood sugar 2 times daily, Disp: 100 each, Rfl: 12 .  Insulin Glargine (BASAGLAR KWIKPEN) 100 UNIT/ML, Inject 40 Units into the skin at bedtime., Disp: 45 mL, Rfl: 2 .  insulin lispro (HUMALOG KWIKPEN) 100 UNIT/ML KwikPen, Take HUmalog 8 units with each meal PLUS Humalog correctional  insulin scale max daily dose 50 units, Disp: 30 mL, Rfl: 3 .  Insulin Lispro (HUMALOG [place]), Inject 8 Units into the skin in the morning, at noon, and at bedtime., Disp: , Rfl:  .  Insulin Pen Needle 32G X 4 MM MISC, 1 Device by Does not apply route in the morning, at noon, in the evening, and at bedtime., Disp: 400 each, Rfl: 3 .  Lancets (ONETOUCH DELICA PLUS LANCET33G) MISC, USE   TO CHECK GLUCOSE 4 TIMES DAILY, Disp: 100 each, Rfl: 3 .  latanoprost (XALATAN) 0.005 % ophthalmic solution, Place 1 drop into both eyes at bedtime., Disp: , Rfl:  .  levothyroxine (SYNTHROID, LEVOTHROID) 75 MCG tablet, Take 75 mcg by mouth daily before breakfast., Disp: , Rfl:  .  LUMIGAN 0.01 % SOLN, Place 1 drop into both eyes at bedtime., Disp: , Rfl:  .  Multiple Vitamins-Minerals (ALIVE ONCE DAILY WOMENS 50+ PO), Take 1 tablet by mouth daily., Disp: , Rfl:  .  ondansetron (ZOFRAN) 4 MG tablet, Take 1 tablet (4 mg total) by mouth every 8 (eight) hours as needed for nausea or vomiting., Disp: 20 tablet, Rfl: 0 .  spironolactone (ALDACTONE) 25 MG tablet, Take 1 tablet (25 mg total) by mouth daily., Disp: 90 tablet, Rfl: 3 .  torsemide (DEMADEX) 20 MG tablet, Take 2 tablets (40 mg total) by mouth daily., Disp: 60 tablet, Rfl: 11 .  Travoprost, BAK Free, (TRAVATAN) 0.004 % SOLN ophthalmic solution, Place 1 drop into both eyes at bedtime., Disp: , Rfl:  .  Biotin 10 MG TABS, Take 10 mg by mouth daily. (Patient not taking: Reported on 04/04/2023), Disp: , Rfl:  .  Cinnamon 500 MG capsule, Take 500 mg by mouth daily. (Patient not taking: Reported on 04/18/2023), Disp: , Rfl:  .  Continuous Glucose Sensor (DEXCOM G6 SENSOR) MISC, 1 Device by Does not apply route as directed. (Patient not taking: Reported on 05/09/2023), Disp: 9 each, Rfl: 3 .  Continuous Glucose Transmitter (DEXCOM G6 TRANSMITTER) MISC, Change every 90 days (Patient not taking: Reported on 05/09/2023), Disp: 1 each, Rfl: 3 .  insulin aspart (NOVOLOG FLEXPEN)  100 UNIT/ML FlexPen, DIAL AND INJECT UNDER THE SKIN THREE TIMES DAILY PER CORRECTION SCALE. MAX DAILY DOSE 50 UNITS (Patient not taking: Reported on 05/09/2023), Disp: 15 mL, Rfl: 0 Allergies  Allergen Reactions  . Codeine Other (See Comments)  . Other Other (See Comments)    Blood Product Refusal (Jehovah's witness)    . Tape Other (See Comments)    Leaves blisters and marks on the skin  . Sulfa Antibiotics Rash  . Uloric [Febuxostat] Rash     Social History   Socioeconomic History  . Marital status: Widowed    Spouse name: Not on file  . Number of children: 3  . Years of education: Not on file  . Highest education level: Some college, no degree  Occupational History  . Occupation: retired  Tobacco Use  . Smoking status: Former    Current packs/day: 0.00    Average packs/day: 1 pack/day for 22.0 years (22.0 ttl pk-yrs)    Types: Cigarettes    Start date: 71    Quit date: 67    Years since quitting: 41.9  . Smokeless tobacco: Never  Vaping Use  . Vaping status: Never Used  Substance and Sexual Activity  . Alcohol use: Yes    Comment: 05/22/2018 "glass of wine couple times/wk"  . Drug use: Not Currently  . Sexual activity: Not Currently    Birth control/protection: None, Post-menopausal  Other Topics Concern  . Not on file  Social History Narrative  . Not on file   Social Drivers of Health   Financial Resource Strain: Medium Risk (12/22/2021)   Overall Financial Resource Strain (CARDIA)   . Difficulty of Paying Living Expenses: Somewhat hard  Food Insecurity: No Food Insecurity (05/08/2018)   Hunger Vital Sign   . Worried About Programme researcher, broadcasting/film/video in the Last Year: Never true   . Ran Out of Food in the Last Year: Never true  Transportation Needs: Unmet Transportation Needs (05/08/2022)   PRAPARE - Transportation   . Lack of Transportation (Medical): Yes   . Lack of Transportation (Non-Medical): Yes  Physical Activity: Not on file  Stress: Not on file   Social Connections: Not on file  Intimate Partner Violence: Not on file    Physical Exam      Future Appointments  Date Time Provider Department Center  05/15/2023  2:00 PM Louann Sjogren, DPM [facility]  05/17/2023 11:00 AM [facility] ECHO OP 1 [facility]  05/17/2023 12:00 PM Bensimhon, Bevelyn Buckles, MD [facility] None  06/07/2023  7:05 AM [facility] DEVICE REMOTES [facility]  07/17/2023 10:00 AM [facility] HS VASC 6 [facility]  07/17/2023 10:30 AM [facility] PA [facility]  09/06/2023  7:05 AM [facility] DEVICE REMOTES [facility]  12/06/2023  7:05 AM [facility] DEVICE REMOTES [facility]  03/06/2024  7:05 AM [facility] DEVICE REMOTES [facility]  06/05/2024  7:05 AM [facility] DEVICE REMOTES [facility]  09/04/2024  7:05 AM [facility] DEVICE REMOTES [facility]  12/04/2024  7:05 AM [facility] DEVICE REMOTES [facility]  03/05/2025  7:05 AM [facility] DEVICE REMOTES [facility]  06/04/2025  7:05 AM [facility] DEVICE REMOTES [facility]  09/03/2025  7:05 AM [facility] DEVICE REMOTES [facility]  12/03/2025  7:05 AM [facility] DEVICE REMOTES [facility]

## 2023-05-13 ENCOUNTER — Telehealth: Payer: Self-pay | Admitting: Cardiology

## 2023-05-13 NOTE — Telephone Encounter (Signed)
Patient states that the water chamber may need to be replaced. Requesting call back to discuss further.

## 2023-05-14 NOTE — Telephone Encounter (Signed)
Reached out to [place] today and she states she needed a new water chamber for her cpap and she really wanted a new cpap machine. She is eligible for a new machine but she is in need of a new dme as [facility] is no longer a dme option. Patient agreed to have [employer] as her dme but she will need a recent OV which we were able to set that up today. Once she has that visit she can be set up with [facility] and they will order her the new cpap machine.

## 2023-05-15 ENCOUNTER — Ambulatory Visit: Payer: Medicare HMO | Admitting: Podiatry

## 2023-05-15 DIAGNOSIS — E1151 Type 2 diabetes mellitus with diabetic peripheral angiopathy without gangrene: Secondary | ICD-10-CM | POA: Diagnosis not present

## 2023-05-15 DIAGNOSIS — L97521 Non-pressure chronic ulcer of other part of left foot limited to breakdown of skin: Secondary | ICD-10-CM | POA: Diagnosis not present

## 2023-05-15 DIAGNOSIS — Z87898 Personal history of other specified conditions: Secondary | ICD-10-CM

## 2023-05-15 DIAGNOSIS — L84 Corns and callosities: Secondary | ICD-10-CM

## 2023-05-15 NOTE — Progress Notes (Signed)
Subjective:  Patient ID: Danielle [name], female    DOB: 1945/11/12,  MRN: [id number]  No chief complaint on file.   77 y.o. female returns for follow-up of left great toe wound.  Relates doing well around the second toe. Relates wound to left hallux is improving and doing well. Has been dressing as instructed   Review of Systems: Negative except as noted in the HPI. Denies N/V/F/Ch.  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HCC) 02/05/2017   Acute gout due to renal impairment involving right wrist 05/20/2018   Acute kidney failure (HCC) 05/15/2018   Arthritis    "all over" (05/22/2018)   Blood dyscrasia    per pt-has small blood cells-appears as if anemic   Breast cancer, left breast (HCC) 1985   CHF (congestive heart failure) (HCC)    Complication of anesthesia    difficult to awaken from per pt   Coronary artery disease    Dyspnea    Essential hypertension    Heart disease    Heart murmur    History of gout    Hypothyroidism    Obesity (BMI 30-39.9) 11/11/2017   OSA (obstructive sleep apnea) 02/18/2017    severe obstructive sleep apnea with an AHI of 75.6/h and mild central sleep apnea with a CAI of 7.7/h.  Oxygen saturations dropped as low as 82%.   He is on CPAP at 9 cm H2O.   OSA on CPAP    Personal history of chemotherapy    Personal history of radiation therapy    Presence of permanent cardiac pacemaker    Primary localized osteoarthritis of right knee 05/13/2018   Refusal of blood transfusions as patient is Jehovah's Witness    Type II diabetes mellitus (HCC)     Current Outpatient Medications:    acetaminophen (TYLENOL) 500 MG tablet, Take 500-1,000 mg by mouth every 6 (six) hours as needed (for pain or headaches)., Disp: , Rfl:    allopurinol (ZYLOPRIM) 100 MG tablet, Take 100 mg by mouth daily., Disp: , Rfl:    apixaban (ELIQUIS) 5 MG TABS tablet, Take 1 tablet (5 mg total) by mouth 2 (two) times  daily., Disp: 60 tablet, Rfl: 11   aspirin EC 81 MG tablet, Take 1 tablet (81 mg total) by mouth daily. Swallow whole., Disp: 360 tablet, Rfl: 0   atorvastatin (LIPITOR) 80 MG tablet, TAKE 1 TABLET BY MOUTH AT BEDTIME, Disp: 90 tablet, Rfl: 3   B Complex-Biotin-FA (B-COMPLEX PO), Take 1 tablet by mouth daily., Disp: , Rfl:    Biotin 10 MG TABS, Take 10 mg by mouth daily. (Patient not taking: Reported on 04/04/2023), Disp: , Rfl:    Blood Glucose Monitoring Suppl (ONETOUCH VERIO) w/Device KIT, Use to check blood sugar, Disp: 1 kit, Rfl: 0   carvedilol (COREG) 3.125 MG tablet, Take 1 tablet (3.125 mg total) by mouth 2 (two) times daily with a meal., Disp: 180 tablet, Rfl: 3   Cinnamon 500 MG capsule, Take 500 mg by mouth daily. (Patient not taking: Reported on 04/18/2023), Disp: , Rfl:    clotrimazole-betamethasone (LOTRISONE) lotion, Apply 1 application topically 2 (two) times daily as needed (itching)., Disp: , Rfl:    Continuous Glucose Sensor (DEXCOM G6 SENSOR) MISC, 1 Device by Does not apply route as directed. (Patient not taking: Reported on 05/09/2023), Disp: 9 each, Rfl: 3   Continuous Glucose Transmitter (DEXCOM G6 TRANSMITTER) MISC, Change every 90 days (Patient not taking: Reported  on 05/09/2023), Disp: 1 each, Rfl: 3   empagliflozin (JARDIANCE) 10 MG TABS tablet, Take 1 tablet (10 mg total) by mouth daily., Disp: 30 tablet, Rfl: 11   ferrous sulfate 325 (65 FE) MG tablet, Take 325 mg by mouth 3 (three) times a week., Disp: , Rfl:    folic acid (FOLVITE) 1 MG tablet, Take 1 tablet by mouth once daily, Disp: 90 tablet, Rfl: 3   gabapentin (NEURONTIN) 300 MG capsule, Take 300 mg by mouth 2 (two) times daily., Disp: , Rfl:    glucose blood (ONETOUCH VERIO) test strip, Check blood sugar 2 times daily, Disp: 100 each, Rfl: 12   insulin aspart (NOVOLOG FLEXPEN) 100 UNIT/ML FlexPen, DIAL AND INJECT UNDER THE SKIN THREE TIMES DAILY PER CORRECTION SCALE. MAX DAILY DOSE 50 UNITS (Patient not taking:  Reported on 05/09/2023), Disp: 15 mL, Rfl: 0   Insulin Glargine (BASAGLAR KWIKPEN) 100 UNIT/ML, Inject 40 Units into the skin at bedtime., Disp: 45 mL, Rfl: 2   insulin lispro (HUMALOG KWIKPEN) 100 UNIT/ML KwikPen, Take HUmalog 8 units with each meal PLUS Humalog correctional insulin scale max daily dose 50 units, Disp: 30 mL, Rfl: 3   Insulin Lispro (HUMALOG [place]), Inject 8 Units into the skin in the morning, at noon, and at bedtime., Disp: , Rfl:    Insulin Pen Needle 32G X 4 MM MISC, 1 Device by Does not apply route in the morning, at noon, in the evening, and at bedtime., Disp: 400 each, Rfl: 3   Lancets (ONETOUCH DELICA PLUS LANCET33G) MISC, USE   TO CHECK GLUCOSE 4 TIMES DAILY, Disp: 100 each, Rfl: 3   latanoprost (XALATAN) 0.005 % ophthalmic solution, Place 1 drop into both eyes at bedtime., Disp: , Rfl:    levothyroxine (SYNTHROID, LEVOTHROID) 75 MCG tablet, Take 75 mcg by mouth daily before breakfast., Disp: , Rfl:    LUMIGAN 0.01 % SOLN, Place 1 drop into both eyes at bedtime., Disp: , Rfl:    Multiple Vitamins-Minerals (ALIVE ONCE DAILY WOMENS 50+ PO), Take 1 tablet by mouth daily., Disp: , Rfl:    ondansetron (ZOFRAN) 4 MG tablet, Take 1 tablet (4 mg total) by mouth every 8 (eight) hours as needed for nausea or vomiting., Disp: 20 tablet, Rfl: 0   spironolactone (ALDACTONE) 25 MG tablet, Take 1 tablet (25 mg total) by mouth daily., Disp: 90 tablet, Rfl: 3   torsemide (DEMADEX) 20 MG tablet, Take 2 tablets (40 mg total) by mouth daily., Disp: 60 tablet, Rfl: 11   Travoprost, BAK Free, (TRAVATAN) 0.004 % SOLN ophthalmic solution, Place 1 drop into both eyes at bedtime., Disp: , Rfl:   Social History   Tobacco Use  Smoking Status Former   Current packs/day: 0.00   Average packs/day: 1 pack/day for 22.0 years (22.0 ttl pk-yrs)   Types: Cigarettes   Start date: 55   Quit date: 8   Years since quitting: 41.9  Smokeless Tobacco Never    Allergies  Allergen Reactions   Codeine  Other (See Comments)   Other Other (See Comments)    Blood Product Refusal (Jehovah's witness)     Tape Other (See Comments)    Leaves blisters and marks on the skin   Sulfa Antibiotics Rash   Uloric [Febuxostat] Rash   Objective:  Vascular: DP/PT pulses 2/4 bilateral. CFT <3 seconds. Absent hair growth on digits. Edema noted to bilateral lower extremities. Xerosis noted bilaterally.  Skin. No lacerations or abrasions bilateral feet. Nails 1-5 bilateral  are  thickened discolored and elongated with subungual debris. Left second digit amputation . Left hallux with hyperkeratosis noted and upon debridedment ulceration noted to plantar hallux with granular base. No erythema edema or purulence noted. No probe to bone Musculoskeletal: MMT 5/5 bilateral lower extremities in DF, PF, Inversion and Eversion. Deceased ROM in DF of ankle joint. Left second digit amputation.  Neurological: Sensation intact to light touch. Protective sensation diminished bilateral.   Assessment:   1. Skin ulcer of left great toe, limited to breakdown of skin (HCC)   2. Type II diabetes mellitus with peripheral circulatory disorder (HCC)   3. Corns and callosities   4. History of ulceration       Plan:  Patient was evaluated and treated and all questions answered. Ulcer left hallux limited to skin breakdown.  -Debridement as below. -Dressed with betadine, DSD. -Off-loading with surgical shoe. Added impression to offfload toe more.  -Start working on DM shoes.  -No abx indicated.  -Discussed glucose control and proper protein-rich diet.  -Discussed if any worsening redness, pain, fever or chills to call or may need to report to the emergency room. Patient expressed understanding.   Procedure: Excisional Debridement of Wound Rationale: Removal of non-viable soft tissue from the wound to promote healing.  Anesthesia: none Pre-Debridement Wound Measurements: Overlying callus  Post-Debridement Wound  Measurements: 0.2 cm x 0.2 cm x 0.1 cm  Type of Debridement: Sharp Excisional Tissue Removed: Non-viable soft tissue Depth of Debridement: subcutaneous tissue. Technique: Sharp excisional debridement to bleeding, viable wound base.  Dressing: Dry, sterile, compression dressing. Disposition: Patient tolerated procedure well. Patient to return in 2 week for follow-up.  Return in about 2 weeks (around 05/29/2023) for wound check.   Return in about 2 weeks (around 05/29/2023) for wound check.

## 2023-05-16 ENCOUNTER — Other Ambulatory Visit (HOSPITAL_COMMUNITY): Payer: Self-pay | Admitting: Emergency Medicine

## 2023-05-16 ENCOUNTER — Telehealth (HOSPITAL_COMMUNITY): Payer: Self-pay

## 2023-05-16 DIAGNOSIS — H6123 Impacted cerumen, bilateral: Secondary | ICD-10-CM | POA: Diagnosis not present

## 2023-05-16 DIAGNOSIS — H608X3 Other otitis externa, bilateral: Secondary | ICD-10-CM | POA: Diagnosis not present

## 2023-05-16 NOTE — Progress Notes (Signed)
Paramedicine Encounter    Patient ID: Danielle [name], female    DOB: Jun 10, 1945, 77 y.o.   MRN: [id number]   Complaints  - right shoulder pain   Assessment - no edema to left leg, +1 edema to right lower extremity.   Compliance with meds - missed 1 full day of medication and 2 additional night doses.   Pill box filled - for 2x weeks. Out of eliquis on sat week 2  Refills needed - jardiance, eliquis, biotin, asa, cinnamon, multivitamin, carvedilol  Meds changes since last visit - none    Social changes - none   VISIT SUMMARY**  I met with Danielle [name] in the home. Pt reported that she has been to lots of doctor's appointments and has been staying on her medication and doctors appointments. Pt reported that she has been on the phone trying to get a new CPAP machine to ensure compliance. Pt's application for patient assistance for Eliquis was denied and pt concerned about increased prices. [employer] advised that she needed to spend 3% on medication costs prior to reapplying. Will touch base with clinic for samples and further financial assistance. Pt's pill box was filled for 2x weeks. Will have [facility] follow up in 2x weeks.   BP 116/60   Pulse 74   Resp 16   Wt 203 lb (92.1 kg) Comment: at doctor with clothes and shoes on  SpO2 96%   BMI 34.84 kg/m  Weight yesterday-198 lbs  Last visit weight-195 lbs   [place] [facility] EMT-P Community Paramedic  [phone]     ACTION: Home visit completed     Patient Care Team: Renford Dills, MD as PCP - General (Internal Medicine) Regan Lemming, MD as PCP - Electrophysiology (Cardiology) Quintella Reichert, MD as PCP - Sleep Medicine (Cardiology) Bensimhon, Bevelyn Buckles, MD as PCP - Cardiology (Cardiology) Burna Sis, LCSW as Social Worker (Licensed Clinical Social Worker) Shamleffer, Konrad Dolores, MD as Consulting Physician (Endocrinology)  Patient Active Problem List   Diagnosis Date Noted   Chronic systolic heart failure  (HCC) 82/95/6213   Type 2 diabetes mellitus with stage 3a chronic kidney disease, with long-term current use of insulin (HCC) 02/16/2022   Prosthetic joint implant failure, initial encounter (HCC) 12/20/2021   Pseudophakia of both eyes 11/15/2021   DVT (deep venous thrombosis) (HCC) 07/31/2021   Acute blood loss anemia    Septic Arthritis, Infection of prosthetic right knee joint (HCC), MSSA infection 07/10/2021   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) 07/10/2021   CAD (coronary artery disease) 06/08/2021   Carotid artery disease (HCC) 06/08/2021   Symptomatic bradycardia 06/08/2021   Paroxysmal atrial fibrillation (HCC) 06/08/2021   CKD (chronic kidney disease), stage III (HCC) 06/08/2021   Polyneuropathy associated with underlying disease (HCC) 05/04/2020   Severe nonproliferative diabetic retinopathy of left eye, with macular edema, associated with type 2 diabetes mellitus (HCC) 11/05/2019   Severe nonproliferative diabetic retinopathy of right eye, with macular edema, associated with type 2 diabetes mellitus (HCC) 11/05/2019   Retinal hemorrhage of right eye 11/05/2019   Retinal hemorrhage of left eye 11/05/2019   Diabetes mellitus (HCC) 05/14/2019   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, with long-term current use of insulin (HCC) 05/14/2019   Type 2 diabetes mellitus with diabetic polyneuropathy, with long-term current use of insulin (HCC) 05/14/2019   History of gout 05/20/2018   Primary localized osteoarthritis of right knee 05/13/2018   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) 12/20/2017  Obesity (BMI 30-39.9) 11/11/2017   OSA (obstructive sleep apnea) 02/18/2017   Essential hypertension    L Breast Cancer    Hypothyroidism     Current Outpatient Medications:    acetaminophen (TYLENOL) 500 MG tablet, Take 500-1,000 mg by mouth every 6 (six) hours as needed (for pain or headaches)., Disp: , Rfl:    allopurinol (ZYLOPRIM) 100 MG tablet,  Take 100 mg by mouth daily., Disp: , Rfl:    apixaban (ELIQUIS) 5 MG TABS tablet, Take 1 tablet (5 mg total) by mouth 2 (two) times daily., Disp: 60 tablet, Rfl: 11   atorvastatin (LIPITOR) 80 MG tablet, TAKE 1 TABLET BY MOUTH AT BEDTIME, Disp: 90 tablet, Rfl: 3   B Complex-Biotin-FA (B-COMPLEX PO), Take 1 tablet by mouth daily., Disp: , Rfl:    Blood Glucose Monitoring Suppl (ONETOUCH VERIO) w/Device KIT, Use to check blood sugar, Disp: 1 kit, Rfl: 0   carvedilol (COREG) 3.125 MG tablet, Take 1 tablet (3.125 mg total) by mouth 2 (two) times daily with a meal., Disp: 180 tablet, Rfl: 3   Cinnamon 500 MG capsule, Take 500 mg by mouth daily., Disp: , Rfl:    clotrimazole-betamethasone (LOTRISONE) lotion, Apply 1 application topically 2 (two) times daily as needed (itching)., Disp: , Rfl:    empagliflozin (JARDIANCE) 10 MG TABS tablet, Take 1 tablet (10 mg total) by mouth daily., Disp: 30 tablet, Rfl: 11   ferrous sulfate 325 (65 FE) MG tablet, Take 325 mg by mouth 3 (three) times a week., Disp: , Rfl:    folic acid (FOLVITE) 1 MG tablet, Take 1 tablet by mouth once daily, Disp: 90 tablet, Rfl: 3   gabapentin (NEURONTIN) 300 MG capsule, Take 300 mg by mouth 2 (two) times daily., Disp: , Rfl:    glucose blood (ONETOUCH VERIO) test strip, Check blood sugar 2 times daily, Disp: 100 each, Rfl: 12   Insulin Glargine (BASAGLAR KWIKPEN) 100 UNIT/ML, Inject 40 Units into the skin at bedtime., Disp: 45 mL, Rfl: 2   insulin lispro (HUMALOG KWIKPEN) 100 UNIT/ML KwikPen, Take HUmalog 8 units with each meal PLUS Humalog correctional insulin scale max daily dose 50 units, Disp: 30 mL, Rfl: 3   Insulin Lispro (HUMALOG [place]), Inject 8 Units into the skin in the morning, at noon, and at bedtime., Disp: , Rfl:    Insulin Pen Needle 32G X 4 MM MISC, 1 Device by Does not apply route in the morning, at noon, in the evening, and at bedtime., Disp: 400 each, Rfl: 3   Lancets (ONETOUCH DELICA PLUS LANCET33G) MISC, USE   TO  CHECK GLUCOSE 4 TIMES DAILY, Disp: 100 each, Rfl: 3   latanoprost (XALATAN) 0.005 % ophthalmic solution, Place 1 drop into both eyes at bedtime., Disp: , Rfl:    levothyroxine (SYNTHROID, LEVOTHROID) 75 MCG tablet, Take 75 mcg by mouth daily before breakfast., Disp: , Rfl:    LUMIGAN 0.01 % SOLN, Place 1 drop into both eyes at bedtime., Disp: , Rfl:    Multiple Vitamins-Minerals (ALIVE ONCE DAILY WOMENS 50+ PO), Take 1 tablet by mouth daily., Disp: , Rfl:    ondansetron (ZOFRAN) 4 MG tablet, Take 1 tablet (4 mg total) by mouth every 8 (eight) hours as needed for nausea or vomiting., Disp: 20 tablet, Rfl: 0   spironolactone (ALDACTONE) 25 MG tablet, Take 1 tablet (25 mg total) by mouth daily., Disp: 90 tablet, Rfl: 3   torsemide (DEMADEX) 20 MG tablet, Take 2 tablets (40 mg total) by mouth  daily., Disp: 60 tablet, Rfl: 11   Travoprost, BAK Free, (TRAVATAN) 0.004 % SOLN ophthalmic solution, Place 1 drop into both eyes at bedtime., Disp: , Rfl:    Biotin 10 MG TABS, Take 10 mg by mouth daily., Disp: , Rfl:    Continuous Glucose Sensor (DEXCOM G6 SENSOR) MISC, 1 Device by Does not apply route as directed., Disp: 9 each, Rfl: 3   Continuous Glucose Transmitter (DEXCOM G6 TRANSMITTER) MISC, Change every 90 days, Disp: 1 each, Rfl: 3   insulin aspart (NOVOLOG FLEXPEN) 100 UNIT/ML FlexPen, DIAL AND INJECT UNDER THE SKIN THREE TIMES DAILY PER CORRECTION SCALE. MAX DAILY DOSE 50 UNITS, Disp: 15 mL, Rfl: 0 Allergies  Allergen Reactions   Codeine Other (See Comments)   Other Other (See Comments)    Blood Product Refusal (Jehovah's witness)     Tape Other (See Comments)    Leaves blisters and marks on the skin   Sulfa Antibiotics Rash   Uloric [Febuxostat] Rash     Social History   Socioeconomic History   Marital status: Widowed    Spouse name: Not on file   Number of children: 3   Years of education: Not on file   Highest education level: Some college, no degree  Occupational History    Occupation: retired  Tobacco Use   Smoking status: Former    Current packs/day: 0.00    Average packs/day: 1 pack/day for 22.0 years (22.0 ttl pk-yrs)    Types: Cigarettes    Start date: 63    Quit date: 36    Years since quitting: 42.0   Smokeless tobacco: Never  Vaping Use   Vaping status: Never Used  Substance and Sexual Activity   Alcohol use: Yes    Comment: 05/22/2018 "glass of wine couple times/wk"   Drug use: Not Currently   Sexual activity: Not Currently    Birth control/protection: None, Post-menopausal  Other Topics Concern   Not on file  Social History Narrative   Not on file   Social Drivers of Health   Financial Resource Strain: Medium Risk (12/22/2021)   Overall Financial Resource Strain (CARDIA)    Difficulty of Paying Living Expenses: Somewhat hard  Food Insecurity: No Food Insecurity (05/08/2018)   Hunger Vital Sign    Worried About Running Out of Food in the Last Year: Never true    Ran Out of Food in the Last Year: Never true  Transportation Needs: Unmet Transportation Needs (05/08/2022)   PRAPARE - Administrator, Civil Service (Medical): Yes    Lack of Transportation (Non-Medical): Yes  Physical Activity: Not on file  Stress: Not on file  Social Connections: Not on file  Intimate Partner Violence: Not on file    Physical Exam      Future Appointments  Date Time Provider Department Center  06/03/2023 10:00 AM Louann Sjogren, DPM [facility]  06/04/2023  4:20 PM Quintella Reichert, MD [facility]  06/07/2023  7:05 AM [facility] DEVICE REMOTES [facility]  07/17/2023 10:00 AM [facility] V[facility] 6 [facility]  07/17/2023 10:30 AM [facility]  09/06/2023  7:05 AM [facility] DEVICE REMOTES [facility]  12/06/2023  7:05 AM [facility] DEVICE REMOTES [facility]  03/06/2024  7:05 AM [facility] DEVICE REMOTES [facility]  06/05/2024  7:05 AM [facility] DEVICE  REMOTES [facility]  09/04/2024  7:05 AM [facility] DEVICE REMOTES [facility]  12/04/2024  7:05 AM [facility] DEVICE REMOTES [facility]  03/05/2025  7:05 AM  [facility] DEVICE REMOTES [facility]  06/04/2025  7:05 AM [facility] DEVICE REMOTES [facility]  09/03/2025  7:05 AM [facility] DEVICE REMOTES [facility]  12/03/2025  7:05 AM [facility] DEVICE REMOTES [facility]

## 2023-05-16 NOTE — Telephone Encounter (Signed)
Advanced Heart Failure Patient Advocate Encounter  Received a fax that patient is not eligible for Eliquis renewal for 2025, as [employer] will be requiring 3% of income spent on medication for 2025 applications.  This patient is currently approved until 05/28/2023, and will need to reapply for 2025 once criteria is met.  Burnell Blanks, CPhT Rx Patient Advocate Phone: [phone]

## 2023-05-17 ENCOUNTER — Ambulatory Visit (HOSPITAL_BASED_OUTPATIENT_CLINIC_OR_DEPARTMENT_OTHER)
Admission: RE | Admit: 2023-05-17 | Discharge: 2023-05-17 | Disposition: A | Discharge status: Home / Self Care | Payer: Medicare HMO | Source: Ambulatory Visit | Attending: Family Medicine | Admitting: Family Medicine

## 2023-05-17 ENCOUNTER — Ambulatory Visit (HOSPITAL_COMMUNITY)
Admission: RE | Admit: 2023-05-17 | Discharge: 2023-05-17 | Disposition: A | Discharge status: Home / Self Care | Payer: Medicare HMO | Source: Ambulatory Visit | Attending: Internal Medicine | Admitting: Internal Medicine

## 2023-05-17 ENCOUNTER — Encounter (HOSPITAL_COMMUNITY): Payer: Self-pay | Admitting: Internal Medicine

## 2023-05-17 ENCOUNTER — Other Ambulatory Visit (HOSPITAL_COMMUNITY): Payer: Self-pay

## 2023-05-17 VITALS — BP 142/82 | HR 71 | Ht 64.0 in | Wt 202.0 lb

## 2023-05-17 DIAGNOSIS — G4733 Obstructive sleep apnea (adult) (pediatric): Secondary | ICD-10-CM | POA: Diagnosis not present

## 2023-05-17 DIAGNOSIS — I5022 Chronic systolic (congestive) heart failure: Secondary | ICD-10-CM

## 2023-05-17 DIAGNOSIS — N1832 Chronic kidney disease, stage 3b: Secondary | ICD-10-CM | POA: Diagnosis not present

## 2023-05-17 DIAGNOSIS — E119 Type 2 diabetes mellitus without complications: Secondary | ICD-10-CM | POA: Insufficient documentation

## 2023-05-17 DIAGNOSIS — Z794 Long term (current) use of insulin: Secondary | ICD-10-CM | POA: Diagnosis not present

## 2023-05-17 DIAGNOSIS — I10 Essential (primary) hypertension: Secondary | ICD-10-CM | POA: Diagnosis not present

## 2023-05-17 DIAGNOSIS — Z853 Personal history of malignant neoplasm of breast: Secondary | ICD-10-CM | POA: Diagnosis not present

## 2023-05-17 DIAGNOSIS — I255 Ischemic cardiomyopathy: Secondary | ICD-10-CM | POA: Diagnosis not present

## 2023-05-17 DIAGNOSIS — Z955 Presence of coronary angioplasty implant and graft: Secondary | ICD-10-CM | POA: Insufficient documentation

## 2023-05-17 DIAGNOSIS — I082 Rheumatic disorders of both aortic and tricuspid valves: Secondary | ICD-10-CM | POA: Diagnosis not present

## 2023-05-17 DIAGNOSIS — I480 Paroxysmal atrial fibrillation: Secondary | ICD-10-CM | POA: Diagnosis not present

## 2023-05-17 DIAGNOSIS — M542 Cervicalgia: Secondary | ICD-10-CM | POA: Insufficient documentation

## 2023-05-17 DIAGNOSIS — I130 Hypertensive heart and chronic kidney disease with heart failure and stage 1 through stage 4 chronic kidney disease, or unspecified chronic kidney disease: Secondary | ICD-10-CM | POA: Insufficient documentation

## 2023-05-17 DIAGNOSIS — Z7901 Long term (current) use of anticoagulants: Secondary | ICD-10-CM | POA: Insufficient documentation

## 2023-05-17 DIAGNOSIS — Z87891 Personal history of nicotine dependence: Secondary | ICD-10-CM | POA: Diagnosis not present

## 2023-05-17 DIAGNOSIS — I110 Hypertensive heart disease with heart failure: Secondary | ICD-10-CM | POA: Diagnosis not present

## 2023-05-17 DIAGNOSIS — Z950 Presence of cardiac pacemaker: Secondary | ICD-10-CM | POA: Insufficient documentation

## 2023-05-17 DIAGNOSIS — E039 Hypothyroidism, unspecified: Secondary | ICD-10-CM | POA: Diagnosis not present

## 2023-05-17 DIAGNOSIS — Z96651 Presence of right artificial knee joint: Secondary | ICD-10-CM | POA: Diagnosis not present

## 2023-05-17 DIAGNOSIS — I2510 Atherosclerotic heart disease of native coronary artery without angina pectoris: Secondary | ICD-10-CM

## 2023-05-17 DIAGNOSIS — I5020 Unspecified systolic (congestive) heart failure: Secondary | ICD-10-CM

## 2023-05-17 LAB — ECHOCARDIOGRAM COMPLETE
AR max vel: 1.25 cm2
AV Area VTI: 1.27 cm2
AV Area mean vel: 1.27 cm2
AV Mean grad: 7 mm[Hg]
AV Peak grad: 12.5 mm[Hg]
Ao pk vel: 1.77 m/s
Area-P 1/2: 3.1 cm2
MV M vel: 5.4 m/s
MV Peak grad: 116.6 mm[Hg]
MV VTI: 1.24 cm2
P 1/2 time: 448 ms
Radius: 0.6 cm
S' Lateral: 3.3 cm
Single Plane A4C EF: 47.1 %

## 2023-05-17 NOTE — Patient Instructions (Signed)
Great to see you today!!!  STOP Aspirin  Your physician recommends that you schedule a follow-up appointment in: 6 months, we will call you closer to this time to schedule  Do the following things EVERYDAY: Weigh yourself in the morning before breakfast. Write it down and keep it in a log. Take your medicines as prescribed Eat low salt foods--Limit salt (sodium) to 2000 mg per day.  Stay as active as you can everyday Limit all fluids for the day to less than 2 liters  If you have any questions or concerns before your next appointment please send [place] a message through [place] or call our office at [phone].    TO LEAVE A MESSAGE FOR THE NURSE SELECT OPTION 2, PLEASE LEAVE A MESSAGE INCLUDING: YOUR NAME DATE OF BIRTH CALL BACK NUMBER REASON FOR CALL**this is important as we prioritize the call backs  YOU WILL RECEIVE A CALL BACK THE SAME DAY AS LONG AS YOU CALL BEFORE 4:00 PM  At the [facility], you and your health needs are our priority. As part of our continuing mission to provide you with exceptional heart care, we have created designated Provider Care Teams. These Care Teams include your primary Cardiologist (physician) and Advanced Practice Providers (APPs- Physician Assistants and Nurse Practitioners) who all work together to provide you with the care you need, when you need it.   You may see any of the following providers on your designated Care Team at your next follow up: Dr Arvilla Meres Dr Marca Ancona Dr. Dorthula Nettles Dr. Clearnce Hasten [name] Filbert Schilder, NP Robbie Lis, [place] [facility] [place], [place] Brynda Peon, NP [place] Lee, NP Karle Plumber, PharmD   Please be sure to bring in all your medications bottles to every appointment.    Thank you for choosing [facility]

## 2023-05-17 NOTE — Progress Notes (Signed)
[facility]   PCP: Renford Dills, MD EP: Dr. Elberta Fortis Rheumatology: Dr. Lennette Bihari HF Cardiologist: Dr. Gala Romney  HPI: Mazzy Whitefoot is a 77 y.o.female with a past medical history of systolic HF (EF 45-50%), CAD s/p multiple stents in [place] (with h/o LAD stent thrombosis), ischemic cardiomyopathy (EF 10% at one point), symptomatic bradycardia s/p PPM, HTN, DM, hypothyroidism, and OSA.   Admitted 9/18 with SOB, acute on chronic systolic CHF. Diuresed with IV lasix (12 pounds), discharge weight was 215 pounds.    Had R TKR 12/19.    Admitted 11/22 with right knee septic arthritis. Arthrocentesis culture grew staph aureus.  Underwent I&D and polyexchange of infected right total knee arthroplasty.  Treated with 6 weeks IV antibiotics followed by suppressive abx. Aldactone held, losartan reduced to 25 mg daily and coreg reduced to 12.5 mg BID d/t soft BP. She was given 1 dose of furosemide on admission due to leg edema but this was held later due to AKI.  Torsemide was resumed at discharge.  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  Patient underwent explant right total knee arthroplasty with implantation of articulating antibiotic spacer by Dr. Blanchie Dessert on 07/12/2021. Placed on IV abx. Was discharged to SNF. During hospitalization was very anemic, but improving.   Echo 2/23 EF 40-45%   Follow up 4/23, unsure what meds she was taking. Re-enrolled in paramedicine.   Had knee re-replaced 12/20/21  Echo 05/08/22 showed EF 45-50%, mildly decreased LV with moderate LVH, grade II DD, RV ok, mild MR with severe MAC.  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  Today she returns for HF follow up. Overall feeling fair. Having a lot of neck/shoulder pain. She is not SOB walking on flat ground with her rolling walker. Balance is off. Denies palpitations, CP, dizziness, edema, or PND/Orthopnea. Appetite ok. No fever or chills. Weight at home 198 pounds.  Taking all medications. Trying to get CPAP. Followed by paramedicine. Has Ulcer on left great toe, sees podiatry and is healing.  Echo 05/17/23, EF 40-45%  Cardiac Studies: - Echo (12/23): EF 45-50%  - Echo (2/23): EF 40-45%   - Echo (12/21): EF 45-50% G2DD   - Echo (8/19): EF 45-50%, G2DD, LA mildly dilated, trileaflet moderately thick aortic valve  - Echo (9/18): EF 45-50% Grade 2DD Left Atrium Severely dilated  ROS: All systems negative except as listed in HPI, PMH and Problem List.  SH:  Social History   Socioeconomic History   Marital status: Widowed    Spouse name: Not on file   Number of children: 3   Years of education: Not on file   Highest education level: Some college, no degree  Occupational History   Occupation: retired  Tobacco Use   Smoking status: Former    Current packs/day: 0.00    Average packs/day: 1 pack/day for 22.0 years (22.0 ttl pk-yrs)    Types: Cigarettes    Start date: 7    Quit date: 1983    Years since quitting: 41.9   Smokeless tobacco: Never  Vaping Use   Vaping status: Never Used  Substance and Sexual Activity   Alcohol use: Yes    Comment: 05/22/2018 "glass of wine couple times/wk"   Drug use: Not Currently   Sexual activity: Not Currently    Birth control/protection: None, Post-menopausal  Other Topics Concern   Not on file  Social History Narrative   Not on file   Social Drivers of Health  Financial Resource Strain: Medium Risk (12/22/2021)   Overall Financial Resource Strain (CARDIA)    Difficulty of Paying Living Expenses: Somewhat hard  Food Insecurity: No Food Insecurity (05/08/2018)   Hunger Vital Sign    Worried About Running Out of Food in the Last Year: Never true    Ran Out of Food in the Last Year: Never true  Transportation Needs: Unmet Transportation Needs (05/08/2022)   PRAPARE - Administrator, Civil Service (Medical): Yes    Lack of Transportation (Non-Medical): Yes  Physical Activity: Not  on file  Stress: Not on file  Social Connections: Not on file  Intimate Partner Violence: Not on file   FH:  Family History  Problem Relation Age of Onset   Multiple myeloma Mother    Heart disease Father    Other Sister    Heart disease Brother    Diabetes Sister    Other Sister    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HCC) 02/05/2017   Acute gout due to renal impairment involving right wrist 05/20/2018   Acute kidney failure (HCC) 05/15/2018   Arthritis    "all over" (05/22/2018)   Blood dyscrasia    per pt-has small blood cells-appears as if anemic   Breast cancer, left breast (HCC) 1985   CHF (congestive heart failure) (HCC)    Complication of anesthesia    difficult to awaken from per pt   Coronary artery disease    Dyspnea    Essential hypertension    Heart disease    Heart murmur    History of gout    Hypothyroidism    Obesity (BMI 30-39.9) 11/11/2017   OSA (obstructive sleep apnea) 02/18/2017    severe obstructive sleep apnea with an AHI of 75.6/h and mild central sleep apnea with a CAI of 7.7/h.  Oxygen saturations dropped as low as 82%.   He is on CPAP at 9 cm H2O.   OSA on CPAP    Personal history of chemotherapy    Personal history of radiation therapy    Presence of permanent cardiac pacemaker    Primary localized osteoarthritis of right knee 05/13/2018   Refusal of blood transfusions as patient is Jehovah's Witness    Type II diabetes mellitus (HCC)    Current Outpatient Medications  Medication Sig Dispense Refill   acetaminophen (TYLENOL) 500 MG tablet Take 500-1,000 mg by mouth every 6 (six) hours as needed (for pain or headaches).     allopurinol (ZYLOPRIM) 100 MG tablet Take 100 mg by mouth daily.     apixaban (ELIQUIS) 5 MG TABS tablet Take 1 tablet (5 mg total) by mouth 2 (two) times daily. 60 tablet 11   aspirin EC 81 MG tablet Take 1 tablet (81 mg total) by mouth daily. Swallow  whole. 360 tablet 0   atorvastatin (LIPITOR) 80 MG tablet TAKE 1 TABLET BY MOUTH AT BEDTIME 90 tablet 3   B Complex-Biotin-FA (B-COMPLEX PO) Take 1 tablet by mouth daily.     Biotin 10 MG TABS Take 10 mg by mouth daily.     Blood Glucose Monitoring Suppl (ONETOUCH VERIO) w/Device KIT Use to check blood sugar 1 kit 0   carvedilol (COREG) 3.125 MG tablet Take 1 tablet (3.125 mg total) by mouth 2 (two) times daily with a meal. 180 tablet 3   Cinnamon 500 MG capsule Take 500 mg by mouth daily.     clotrimazole-betamethasone (LOTRISONE)  lotion Apply 1 application topically 2 (two) times daily as needed (itching).     Continuous Glucose Sensor (DEXCOM G6 SENSOR) MISC 1 Device by Does not apply route as directed. 9 each 3   Continuous Glucose Transmitter (DEXCOM G6 TRANSMITTER) MISC Change every 90 days 1 each 3   empagliflozin (JARDIANCE) 10 MG TABS tablet Take 1 tablet (10 mg total) by mouth daily. 30 tablet 11   ferrous sulfate 325 (65 FE) MG tablet Take 325 mg by mouth 3 (three) times a week.     folic acid (FOLVITE) 1 MG tablet Take 1 tablet by mouth once daily 90 tablet 3   gabapentin (NEURONTIN) 300 MG capsule Take 300 mg by mouth 2 (two) times daily.     glucose blood (ONETOUCH VERIO) test strip Check blood sugar 2 times daily 100 each 12   insulin aspart (NOVOLOG FLEXPEN) 100 UNIT/ML FlexPen DIAL AND INJECT UNDER THE SKIN THREE TIMES DAILY PER CORRECTION SCALE. MAX DAILY DOSE 50 UNITS 15 mL 0   Insulin Glargine (BASAGLAR KWIKPEN) 100 UNIT/ML Inject 40 Units into the skin at bedtime. 45 mL 2   insulin lispro (HUMALOG KWIKPEN) 100 UNIT/ML KwikPen Take HUmalog 8 units with each meal PLUS Humalog correctional insulin scale max daily dose 50 units 30 mL 3   Insulin Lispro (HUMALOG [place]) Inject 8 Units into the skin in the morning, at noon, and at bedtime.     Insulin Pen Needle 32G X 4 MM MISC 1 Device by Does not apply route in the morning, at noon, in the evening, and at bedtime. 400 each 3    Lancets (ONETOUCH DELICA PLUS LANCET33G) MISC USE   TO CHECK GLUCOSE 4 TIMES DAILY 100 each 3   latanoprost (XALATAN) 0.005 % ophthalmic solution Place 1 drop into both eyes at bedtime.     levothyroxine (SYNTHROID, LEVOTHROID) 75 MCG tablet Take 75 mcg by mouth daily before breakfast.     LUMIGAN 0.01 % SOLN Place 1 drop into both eyes at bedtime.     Multiple Vitamins-Minerals (ALIVE ONCE DAILY WOMENS 50+ PO) Take 1 tablet by mouth daily.     ondansetron (ZOFRAN) 4 MG tablet Take 1 tablet (4 mg total) by mouth every 8 (eight) hours as needed for nausea or vomiting. 20 tablet 0   spironolactone (ALDACTONE) 25 MG tablet Take 1 tablet (25 mg total) by mouth daily. 90 tablet 3   torsemide (DEMADEX) 20 MG tablet Take 2 tablets (40 mg total) by mouth daily. 60 tablet 11   Travoprost, BAK Free, (TRAVATAN) 0.004 % SOLN ophthalmic solution Place 1 drop into both eyes at bedtime.     No current facility-administered medications for this encounter.   BP (!) 142/82   Pulse 71   Ht 5\' 4"  (1.626 m)   Wt 91.6 kg (202 lb)   SpO2 97%   BMI 34.67 kg/m   Wt Readings from Last 3 Encounters:  05/17/23 91.6 kg (202 lb)  05/16/23 92.1 kg (203 lb)  05/09/23 88.5 kg (195 lb)   PHYSICAL EXAM: General:  NAD. No resp difficulty, walked into clinic with RW HEENT: Normal Neck: Supple. No JVD. Carotids 2+ bilat; no bruits. No lymphadenopathy or thryomegaly appreciated. Posterior neck + TTP Cor: PMI nondisplaced. Regular rate & rhythm. No rubs, gallops or murmurs. Lungs: Clear Abdomen: Soft, obese, nontender, nondistended. No hepatosplenomegaly. No bruits or masses. Good bowel sounds. Extremities: No cyanosis, clubbing, rash, edema; L foot in Ortho shoe; trace ankle edema Neuro: Alert &  oriented x 3, cranial nerves grossly intact. Moves all 4 extremities w/o difficulty. Affect pleasant.  PPM interrogation (personally reviewed): 0.8 hr/day activity, < 0.1 hr/day in AF, no pace-terminated pauses, 98.1%  AS-VS  ASSESSMENT & PLAN: 1. Chronic systolic CHF  - ICM. - Echo (12/21): EF 45-50% G2DD  - Echo (2/23): EF 40-45%  - Echo (12/23): EF 45-50%, moderate LVH grade II DD, RV normal, severe MAC.  - Echo today 05/17/23: EF 40-45%, RV ok, mild to moderate MR - NYHA II, limited more by MSK pain. Overall volume ok, IVC not dilated on today's echo - Continue compression hose. - Continue spironolactone 25 mg daily.  - Continue torsemide 40 mg daily.  - Continue carvedilol 3.125 mg bid. - Continue Jardiance 10 mg daily. Having GU symptoms (see below) - Continue Paramedicine support - Recent labs reviewed and are stable, K 4.3, SCr 1.64  2. CAD  - Stenting in 2010, 2012 and 2016.  - No chest pain. - Continue statin.  - Stop ASA with Eliquis.   3. Paroxysmal Afib - Regular on exam - Continue Eliquis 5 mg bid.  - (She is Jehovah's Witness).   4. HTN - BP up today, but she is in pain. Hold off on adding addition BP agents. - No change to meds today.   5. DM - Per Endocrine. Last A1c 10.2 - On insulin. - Continue Jardiance. Could increase Jardiance if Endocrinology recommends.    6. H/o sinus pauses s/p PPM - Dr. Elberta Fortis now following.  - No change.    7. OSA - Continue CPAP nightly.  - Follows with Dr. Mayford Knife.   8. CKD 3b - Baseline SCr ~ 1.5-2.0 - Last Scr 1.64 (9/24) - Continue Jardiance.   9. 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 Neck/shoulder pain - MSK in nature - Use Tylenol. Discussed massage. - Follow up with PCP  Follow up in 6 months with Dr. Gala Romney.  Jacklynn Ganong, FNP  12:28 PM   Patient seen and examined with the above-signed Advanced Practice Provider and/or Housestaff. I personally reviewed laboratory data, imaging studies and relevant notes. I independently examined the patient and formulated the important aspects of the plan. I have edited the note to reflect any of my changes or salient points. I  have personally discussed the plan with the patient and/or family.  77 y/o woman with HTN, DM2, PAF, CAD and HFmrEF.   Is being followed by Paramedicine. Has ulcer on toe that is healing. Edema has been well managed. Taking extra diuretics as needed. Denies CP or palpitations.   Struggling with pain in her neck and shoulders  General:  Well appearing. No resp difficulty HEENT: normal Neck: supple. no JVD. Carotids 2+ bilat; no bruits. No lymphadenopathy or thryomegaly appreciated. Trap muscles very tight and sore Cor: PMI nondisplaced. Regular rate & rhythm. No rubs, gallops or murmurs. Lungs: clear Abdomen: soft, nontender, nondistended. No hepatosplenomegaly. No bruits or masses. Good bowel sounds. Extremities: no cyanosis, clubbing, rash, edema Neuro: alert & orientedx3, cranial nerves grossly intact. moves all 4 extremities w/o difficulty. Affect pleasant  Volume status much improved with paramedicine support. BP up a bit in setting of pain but has been fairly well controlled on home visits.  No s/s angina  On exam, trapezius muscles extremely tight and sore. Recommded looking into massage therapy.   Recently labs reviewed and stable.   Arvilla Meres, MD  8:10 PM

## 2023-05-20 ENCOUNTER — Telehealth: Payer: Self-pay

## 2023-05-20 ENCOUNTER — Other Ambulatory Visit: Payer: Self-pay | Admitting: Podiatry

## 2023-05-20 MED ORDER — DOXYCYCLINE HYCLATE 100 MG PO TABS: 100.0000 mg | ORAL_TABLET | Freq: Two times a day (BID) | ORAL | 0 refills | Status: AC

## 2023-05-20 NOTE — Telephone Encounter (Signed)
Patient called and left message stating that she is having a lot of pain in her toe. She states that she couldn't sleep at all last night. She has changed the bandage. Is there anything she can do?

## 2023-05-23 ENCOUNTER — Other Ambulatory Visit (HOSPITAL_COMMUNITY): Payer: Self-pay | Admitting: Emergency Medicine

## 2023-05-23 NOTE — Progress Notes (Signed)
Medication Reconciliation:   I added pt's eliquis samples to pt's pill box.   Pt reminded of upcoming endocrinology appointment (Dr. Al Decant Dooer [facility] -12/30 @9  am) and ensured pt had called transportation for same.   H. Karleen Hampshire will follow up in 1x week.   Benson Setting EMT-P Community Paramedic  [phone]

## 2023-05-24 NOTE — Telephone Encounter (Signed)
I returned call to patient,but no answer. I left a message informing patient that antibiotic had been sent to pharmacy and to call the office if there are any further questions or concerns.

## 2023-05-30 ENCOUNTER — Other Ambulatory Visit (HOSPITAL_COMMUNITY): Payer: Self-pay

## 2023-05-30 ENCOUNTER — Encounter: Payer: Self-pay | Admitting: Internal Medicine

## 2023-05-30 NOTE — Progress Notes (Signed)
 Paramedicine Encounter    Patient ID: Danielle [name], female    DOB: [birth year], 78 y.o.   MRN: [id number]   Complaints- none   Assessment- CAOx4, warm and dry, ambulatory with no shortness of breath, some right lower leg swelling, lungs clear.SABRA CBG- [id number]   Compliance with meds- missed one evening and one morning dose in the last week.   Pill box filled- for one week   Refills needed- carvedilol , torsemide , jardiance    Meds changes since last visit- none     Social changes-  none    VISIT SUMMARY**  Arrived for home visit for Danielle [name] who reports to be feeling well with no acute complaints today. She denied any shortness of breath, dizziness, chest pain. She admits she has had some lower leg swelling but no more than her baseline. Weight and vitals obtained. She admits she has not checked her sugar often due to the pain in her hands and fingers when doing so. She has not gotten a new dexcom or freestyle. I called confirmed upcoming endocrinology appointment for 2/11 at 0900 with Dr. Dooer since missing her 12/30 apt- I asked for the nurse to discuss CGMs with doctor during this visit. I reviewed meds and confirmed same filling pill box for one week. Refills as noted called into [facility]. Patient knows to pick up over the weekend to place needed pills in box. Upcoming appointments confirmed and reviewed. I spent some time discussing diabetic management and she understood the importance of same. Home visit scheduled for one week.   There were no vitals taken for this visit. Weight yesterday-did not weigh  Last visit weight-- 202lbs     ACTION: Home visit completed     Patient Care Team: Rexanne Ingle, MD as PCP - General (Internal Medicine) Inocencio Soyla Lunger, MD as PCP - Electrophysiology (Cardiology) Shlomo Wilbert SAUNDERS, MD as PCP - Sleep Medicine (Cardiology) Bensimhon, Toribio SAUNDERS, MD as PCP - Cardiology (Cardiology) Cathern Andriette DEL, LCSW as Social Worker (Licensed Clinical Social  Worker) Shamleffer, Donell Cardinal, MD as Consulting Physician (Endocrinology)  Patient Active Problem List   Diagnosis Date Noted   Chronic systolic heart failure (HCC) 05/08/2022   Type 2 diabetes mellitus with stage 3a chronic kidney disease, with long-term current use of insulin  (HCC) 02/16/2022   Prosthetic joint implant failure, initial encounter (HCC) 12/20/2021   Pseudophakia of both eyes 11/15/2021   DVT (deep venous thrombosis) (HCC) 07/31/2021   Acute blood loss anemia    Septic Arthritis, Infection of prosthetic right knee joint (HCC), MSSA infection 07/10/2021   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) 07/10/2021   CAD (coronary artery disease) 06/08/2021   Carotid artery disease (HCC) 06/08/2021   Symptomatic bradycardia 06/08/2021   Paroxysmal atrial fibrillation (HCC) 06/08/2021   CKD (chronic kidney disease), stage III (HCC) 06/08/2021   Polyneuropathy associated with underlying disease (HCC) 05/04/2020   Severe nonproliferative diabetic retinopathy of left eye, with macular edema, associated with type 2 diabetes mellitus (HCC) 11/05/2019   Severe nonproliferative diabetic retinopathy of right eye, with macular edema, associated with type 2 diabetes mellitus (HCC) 11/05/2019   Retinal hemorrhage of right eye 11/05/2019   Retinal hemorrhage of left eye 11/05/2019   Diabetes mellitus (HCC) 05/14/2019   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, with long-term current use of insulin  (HCC) 05/14/2019   Type 2 diabetes mellitus with diabetic polyneuropathy, with long-term current use of insulin  (HCC) 05/14/2019   History of gout 05/20/2018   Primary localized osteoarthritis  of right knee 05/13/2018   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) 12/20/2017   Obesity (BMI 30-39.9) 11/11/2017   OSA (obstructive sleep apnea) 02/18/2017   Essential hypertension    L Breast Cancer    Hypothyroidism     Current Outpatient Medications:     acetaminophen  (TYLENOL ) 500 MG tablet, Take 500-1,000 mg by mouth every 6 (six) hours as needed (for pain or headaches)., Disp: , Rfl:    allopurinol  (ZYLOPRIM ) 100 MG tablet, Take 100 mg by mouth daily., Disp: , Rfl:    apixaban  (ELIQUIS ) 5 MG TABS tablet, Take 1 tablet (5 mg total) by mouth 2 (two) times daily., Disp: 60 tablet, Rfl: 11   atorvastatin  (LIPITOR ) 80 MG tablet, TAKE 1 TABLET BY MOUTH AT BEDTIME, Disp: 90 tablet, Rfl: 3   B Complex-Biotin -FA (B-COMPLEX PO), Take 1 tablet by mouth daily., Disp: , Rfl:    Biotin  10 MG TABS, Take 10 mg by mouth daily., Disp: , Rfl:    Blood Glucose Monitoring Suppl (ONETOUCH VERIO) w/Device KIT, Use to check blood sugar, Disp: 1 kit, Rfl: 0   carvedilol  (COREG ) 3.125 MG tablet, Take 1 tablet (3.125 mg total) by mouth 2 (two) times daily with a meal., Disp: 180 tablet, Rfl: 3   Cinnamon 500 MG capsule, Take 500 mg by mouth daily., Disp: , Rfl:    clotrimazole-betamethasone (LOTRISONE) lotion, Apply 1 application topically 2 (two) times daily as needed (itching)., Disp: , Rfl:    empagliflozin  (JARDIANCE ) 10 MG TABS tablet, Take 1 tablet (10 mg total) by mouth daily., Disp: 30 tablet, Rfl: 11   ferrous sulfate  325 (65 FE) MG tablet, Take 325 mg by mouth 3 (three) times a week., Disp: , Rfl:    folic acid  (FOLVITE ) 1 MG tablet, Take 1 tablet by mouth once daily, Disp: 90 tablet, Rfl: 3   gabapentin  (NEURONTIN ) 300 MG capsule, Take 300 mg by mouth 2 (two) times daily., Disp: , Rfl:    glucose blood (ONETOUCH VERIO) test strip, Check blood sugar 2 times daily, Disp: 100 each, Rfl: 12   Insulin  Glargine (BASAGLAR  KWIKPEN) 100 UNIT/ML, Inject 40 Units into the skin at bedtime., Disp: 45 mL, Rfl: 2   Insulin  Lispro (HUMALOG  [place]), Inject 8 Units into the skin in the morning, at noon, and at bedtime., Disp: , Rfl:    Insulin  Pen Needle 32G X 4 MM MISC, 1 Device by Does not apply route in the morning, at noon, in the evening, and at bedtime., Disp: 400 each, Rfl:  3   Lancets (ONETOUCH DELICA PLUS LANCET33G) MISC, USE   TO CHECK GLUCOSE 4 TIMES DAILY, Disp: 100 each, Rfl: 3   latanoprost  (XALATAN ) 0.005 % ophthalmic solution, Place 1 drop into both eyes at bedtime., Disp: , Rfl:    levothyroxine  (SYNTHROID , LEVOTHROID) 75 MCG tablet, Take 75 mcg by mouth daily before breakfast., Disp: , Rfl:    LUMIGAN 0.01 % SOLN, Place 1 drop into both eyes at bedtime., Disp: , Rfl:    Multiple Vitamins-Minerals (ALIVE ONCE DAILY WOMENS 50+ PO), Take 1 tablet by mouth daily., Disp: , Rfl:    ondansetron  (ZOFRAN ) 4 MG tablet, Take 1 tablet (4 mg total) by mouth every 8 (eight) hours as needed for nausea or vomiting., Disp: 20 tablet, Rfl: 0   spironolactone  (ALDACTONE ) 25 MG tablet, Take 1 tablet (25 mg total) by mouth daily., Disp: 90 tablet, Rfl: 3   torsemide  (DEMADEX ) 20 MG tablet, Take 2 tablets (40 mg total) by  mouth daily., Disp: 60 tablet, Rfl: 11   Travoprost, BAK Free, (TRAVATAN) 0.004 % SOLN ophthalmic solution, Place 1 drop into both eyes at bedtime., Disp: , Rfl:    Continuous Glucose Sensor (DEXCOM G6 SENSOR) MISC, 1 Device by Does not apply route as directed. (Patient not taking: Reported on 05/30/2023), Disp: 9 each, Rfl: 3   Continuous Glucose Transmitter (DEXCOM G6 TRANSMITTER) MISC, Change every 90 days (Patient not taking: Reported on 05/30/2023), Disp: 1 each, Rfl: 3   doxycycline  (VIBRA -TABS) 100 MG tablet, Take 1 tablet (100 mg total) by mouth 2 (two) times daily for 10 days. (Patient not taking: Reported on 05/30/2023), Disp: 20 tablet, Rfl: 0   insulin  aspart (NOVOLOG  FLEXPEN) 100 UNIT/ML FlexPen, DIAL  AND INJECT UNDER THE SKIN THREE TIMES DAILY PER CORRECTION SCALE. MAX DAILY DOSE 50 UNITS (Patient not taking: Reported on 05/30/2023), Disp: 15 mL, Rfl: 0   insulin  lispro (HUMALOG  KWIKPEN) 100 UNIT/ML KwikPen, Take HUmalog  8 units with each meal PLUS Humalog  correctional insulin  scale max daily dose 50 units (Patient not taking: Reported on 05/30/2023), Disp: 30  mL, Rfl: 3 Allergies  Allergen Reactions   Codeine Other (See Comments)   Other Other (See Comments)    Blood Product Refusal (Jehovah's witness)     Tape Other (See Comments)    Leaves blisters and marks on the skin   Sulfa Antibiotics Rash   Uloric  [Febuxostat ] Rash     Social History   Socioeconomic History   Marital status: Widowed    Spouse name: Not on file   Number of children: 3   Years of education: Not on file   Highest education level: Some college, no degree  Occupational History   Occupation: retired  Tobacco Use   Smoking status: Former    Current packs/day: 0.00    Average packs/day: 1 pack/day for 22.0 years (22.0 ttl pk-yrs)    Types: Cigarettes    Start date: 74    Quit date: 1983    Years since quitting: 42.0   Smokeless tobacco: Never  Vaping Use   Vaping status: Never Used  Substance and Sexual Activity   Alcohol use: Yes    Comment: 05/22/2018 glass of wine couple times/wk   Drug use: Not Currently   Sexual activity: Not Currently    Birth control/protection: None, Post-menopausal  Other Topics Concern   Not on file  Social History Narrative   Not on file   Social Drivers of Health   Financial Resource Strain: Medium Risk (12/22/2021)   Overall Financial Resource Strain (CARDIA)    Difficulty of Paying Living Expenses: Somewhat hard  Food Insecurity: No Food Insecurity (05/08/2018)   Hunger Vital Sign    Worried About Running Out of Food in the Last Year: Never true    Ran Out of Food in the Last Year: Never true  Transportation Needs: Unmet Transportation Needs (05/08/2022)   PRAPARE - Administrator, Civil Service (Medical): Yes    Lack of Transportation (Non-Medical): Yes  Physical Activity: Not on file  Stress: Not on file  Social Connections: Not on file  Intimate Partner Violence: Not on file    Physical Exam      Future Appointments  Date Time Provider Department Center  06/03/2023 10:00 AM Joya Stabs, DPM [facility]  06/04/2023  4:20 PM Shlomo Wilbert SAUNDERS, MD [facility]  06/07/2023  7:05 AM [facility] DEVICE REMOTES [facility]  07/17/2023 10:00 AM [facility]  07/17/2023 10:30 AM [facility]  09/06/2023  7:05 AM [facility] DEVICE REMOTES [facility]  12/06/2023  7:05 AM [facility] DEVICE REMOTES [facility]  03/06/2024  7:05 AM [facility] DEVICE REMOTES [facility]  06/05/2024  7:05 AM [facility] DEVICE REMOTES [facility]  09/04/2024  7:05 AM [facility] DEVICE REMOTES [facility]  12/04/2024  7:05 AM [facility] DEVICE REMOTES [facility]  03/05/2025  7:05 AM [facility] DEVICE REMOTES [facility]  06/04/2025  7:05 AM [facility] DEVICE REMOTES [facility]  09/03/2025  7:05 AM [facility] DEVICE REMOTES [facility]  12/03/2025  7:05 AM [facility] DEVICE REMOTES [facility]

## 2023-06-03 ENCOUNTER — Ambulatory Visit: Payer: Medicare HMO | Admitting: Podiatry

## 2023-06-03 DIAGNOSIS — M109 Gout, unspecified: Secondary | ICD-10-CM | POA: Diagnosis not present

## 2023-06-03 DIAGNOSIS — L97521 Non-pressure chronic ulcer of other part of left foot limited to breakdown of skin: Secondary | ICD-10-CM

## 2023-06-03 MED ORDER — METHYLPREDNISOLONE 4 MG PO TBPK: ORAL_TABLET | ORAL | 0 refills | Status: DC

## 2023-06-03 NOTE — Addendum Note (Signed)
 Addended by: Louann Sjogren R on: 06/03/2023 10:27 AM   Modules accepted: Level of Service

## 2023-06-03 NOTE — Progress Notes (Signed)
 Subjective:  Patient ID: Danielle [name], female    DOB: 1945-06-07,  MRN: [id number]  No chief complaint on file.   78 y.o. female returns for follow-up of left great toe wound.  Relates doing well around the second toe. Relates  she did not take the anitbiotics given to her for worsening pain in the foot. Relates the pain did improve. She relates a history of gout and believes she had a flare.   Has been dressing as instructed   Review of Systems: Negative except as noted in the HPI. Denies N/V/F/Ch.  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HCC) 02/05/2017   Acute gout due to renal impairment involving right wrist 05/20/2018   Acute kidney failure (HCC) 05/15/2018   Arthritis    all over (05/22/2018)   Blood dyscrasia    per pt-has small blood cells-appears as if anemic   Breast cancer, left breast (HCC) 1985   CHF (congestive heart failure) (HCC)    Complication of anesthesia    difficult to awaken from per pt   Coronary artery disease    Dyspnea    Essential hypertension    Heart disease    Heart murmur    History of gout    Hypothyroidism    Obesity (BMI 30-39.9) 11/11/2017   OSA (obstructive sleep apnea) 02/18/2017    severe obstructive sleep apnea with an AHI of 75.6/h and mild central sleep apnea with a CAI of 7.7/h.  Oxygen saturations dropped as low as 82%.   He is on CPAP at 9 cm H2O.   OSA on CPAP    Personal history of chemotherapy    Personal history of radiation therapy    Presence of permanent cardiac pacemaker    Primary localized osteoarthritis of right knee 05/13/2018   Refusal of blood transfusions as patient is Jehovah's Witness    Type II diabetes mellitus (HCC)     Current Outpatient Medications:    acetaminophen  (TYLENOL ) 500 MG tablet, Take 500-1,000 mg by mouth every 6 (six) hours as needed (for pain or headaches)., Disp: , Rfl:    allopurinol  (ZYLOPRIM ) 100 MG tablet, Take 100 mg by  mouth daily., Disp: , Rfl:    apixaban  (ELIQUIS ) 5 MG TABS tablet, Take 1 tablet (5 mg total) by mouth 2 (two) times daily., Disp: 60 tablet, Rfl: 11   atorvastatin  (LIPITOR ) 80 MG tablet, TAKE 1 TABLET BY MOUTH AT BEDTIME, Disp: 90 tablet, Rfl: 3   B Complex-Biotin -FA (B-COMPLEX PO), Take 1 tablet by mouth daily., Disp: , Rfl:    Biotin  10 MG TABS, Take 10 mg by mouth daily., Disp: , Rfl:    Blood Glucose Monitoring Suppl (ONETOUCH VERIO) w/Device KIT, Use to check blood sugar, Disp: 1 kit, Rfl: 0   carvedilol  (COREG ) 3.125 MG tablet, Take 1 tablet (3.125 mg total) by mouth 2 (two) times daily with a meal., Disp: 180 tablet, Rfl: 3   Cinnamon 500 MG capsule, Take 500 mg by mouth daily., Disp: , Rfl:    clotrimazole-betamethasone (LOTRISONE) lotion, Apply 1 application topically 2 (two) times daily as needed (itching)., Disp: , Rfl:    Continuous Glucose Sensor (DEXCOM G6 SENSOR) MISC, 1 Device by Does not apply route as directed. (Patient not taking: Reported on 05/30/2023), Disp: 9 each, Rfl: 3   Continuous Glucose Transmitter (DEXCOM G6 TRANSMITTER) MISC, Change every 90 days (Patient not taking: Reported on 05/30/2023), Disp: 1 each, Rfl: 3  empagliflozin  (JARDIANCE ) 10 MG TABS tablet, Take 1 tablet (10 mg total) by mouth daily., Disp: 30 tablet, Rfl: 11   ferrous sulfate  325 (65 FE) MG tablet, Take 325 mg by mouth 3 (three) times a week., Disp: , Rfl:    folic acid  (FOLVITE ) 1 MG tablet, Take 1 tablet by mouth once daily, Disp: 90 tablet, Rfl: 3   gabapentin  (NEURONTIN ) 300 MG capsule, Take 300 mg by mouth 2 (two) times daily., Disp: , Rfl:    glucose blood (ONETOUCH VERIO) test strip, Check blood sugar 2 times daily, Disp: 100 each, Rfl: 12   insulin  aspart (NOVOLOG  FLEXPEN) 100 UNIT/ML FlexPen, DIAL  AND INJECT UNDER THE SKIN THREE TIMES DAILY PER CORRECTION SCALE. MAX DAILY DOSE 50 UNITS (Patient not taking: Reported on 05/30/2023), Disp: 15 mL, Rfl: 0   Insulin  Glargine (BASAGLAR  KWIKPEN) 100  UNIT/ML, Inject 40 Units into the skin at bedtime., Disp: 45 mL, Rfl: 2   insulin  lispro (HUMALOG  KWIKPEN) 100 UNIT/ML KwikPen, Take HUmalog  8 units with each meal PLUS Humalog  correctional insulin  scale max daily dose 50 units (Patient not taking: Reported on 05/30/2023), Disp: 30 mL, Rfl: 3   Insulin  Lispro (HUMALOG  [place]), Inject 8 Units into the skin in the morning, at noon, and at bedtime., Disp: , Rfl:    Insulin  Pen Needle 32G X 4 MM MISC, 1 Device by Does not apply route in the morning, at noon, in the evening, and at bedtime., Disp: 400 each, Rfl: 3   Lancets (ONETOUCH DELICA PLUS LANCET33G) MISC, USE   TO CHECK GLUCOSE 4 TIMES DAILY, Disp: 100 each, Rfl: 3   latanoprost  (XALATAN ) 0.005 % ophthalmic solution, Place 1 drop into both eyes at bedtime., Disp: , Rfl:    levothyroxine  (SYNTHROID , LEVOTHROID) 75 MCG tablet, Take 75 mcg by mouth daily before breakfast., Disp: , Rfl:    LUMIGAN 0.01 % SOLN, Place 1 drop into both eyes at bedtime., Disp: , Rfl:    Multiple Vitamins-Minerals (ALIVE ONCE DAILY WOMENS 50+ PO), Take 1 tablet by mouth daily., Disp: , Rfl:    ondansetron  (ZOFRAN ) 4 MG tablet, Take 1 tablet (4 mg total) by mouth every 8 (eight) hours as needed for nausea or vomiting., Disp: 20 tablet, Rfl: 0   spironolactone  (ALDACTONE ) 25 MG tablet, Take 1 tablet (25 mg total) by mouth daily., Disp: 90 tablet, Rfl: 3   torsemide  (DEMADEX ) 20 MG tablet, Take 2 tablets (40 mg total) by mouth daily., Disp: 60 tablet, Rfl: 11   Travoprost, BAK Free, (TRAVATAN) 0.004 % SOLN ophthalmic solution, Place 1 drop into both eyes at bedtime., Disp: , Rfl:   Social History   Tobacco Use  Smoking Status Former   Current packs/day: 0.00   Average packs/day: 1 pack/day for 22.0 years (22.0 ttl pk-yrs)   Types: Cigarettes   Start date: 32   Quit date: 43   Years since quitting: 42.0  Smokeless Tobacco Never    Allergies  Allergen Reactions   Codeine Other (See Comments)   Other Other (See  Comments)    Blood Product Refusal (Jehovah's witness)     Tape Other (See Comments)    Leaves blisters and marks on the skin   Sulfa Antibiotics Rash   Uloric  [Febuxostat ] Rash   Objective:  Vascular: DP/PT pulses 2/4 bilateral. CFT <3 seconds. Absent hair growth on digits. Edema noted to bilateral lower extremities. Xerosis noted bilaterally.  Skin. No lacerations or abrasions bilateral feet. Nails 1-5 bilateral  are thickened discolored and  elongated with subungual debris. Left second digit amputation . Left hallux with hyperkeratosis noted and upon debridedment ulceration noted to plantar hallux with granular base. No erythema edema or purulence noted. No probe to bone Musculoskeletal: MMT 5/5 bilateral lower extremities in DF, PF, Inversion and Eversion. Deceased ROM in DF of ankle joint. Left second digit amputation.  No tenderness erythema or edema noted to the great toe currently.  Neurological: Sensation intact to light touch. Protective sensation diminished bilateral.   Assessment:   1. Skin ulcer of left great toe, limited to breakdown of skin (HCC)   2. Gouty arthritis of foot        Plan:  Patient was evaluated and treated and all questions answered. Ulcer left hallux limited to skin breakdown.  -Debridement as below. -Dressed with betadine , DSD. -Off-loading with surgical shoe and offloading padding.  -Start working on DM shoes.  -No abx indicated.  -Discussed glucose control and proper protein-rich diet.  -Discussed if any worsening redness, pain, fever or chills to call or may need to report to the emergency room. Patient expressed understanding.   -Discussed treatement options for gouty arthritis and gout education provided. -Medrol  dose pack provided in the event of future flares to have on hand.  -Discussed diet and modifications.  -Advised patient to call if symptoms are not improved within 1 week   Procedure: Excisional Debridement of Wound Rationale:  Removal of non-viable soft tissue from the wound to promote healing.  Anesthesia: none Pre-Debridement Wound Measurements: Overlying callus  Post-Debridement Wound Measurements: 0.3 cm x 0.2 cm x 0.1 cm  Type of Debridement: Sharp Excisional Tissue Removed: Non-viable soft tissue Depth of Debridement: subcutaneous tissue. Technique: Sharp excisional debridement to bleeding, viable wound base.  Dressing: Dry, sterile, compression dressing. Disposition: Patient tolerated procedure well. Patient to return in 2 week for follow-up.  Return in about 2 weeks (around 06/17/2023) for wound check.   Return in about 2 weeks (around 06/17/2023) for wound check.

## 2023-06-04 ENCOUNTER — Encounter: Payer: Self-pay | Admitting: Cardiology

## 2023-06-04 ENCOUNTER — Ambulatory Visit: Payer: Medicare HMO | Attending: Cardiology | Admitting: Cardiology

## 2023-06-04 VITALS — BP 122/74 | HR 74 | Ht 64.0 in | Wt 199.0 lb

## 2023-06-04 DIAGNOSIS — I10 Essential (primary) hypertension: Secondary | ICD-10-CM | POA: Diagnosis not present

## 2023-06-04 DIAGNOSIS — G4733 Obstructive sleep apnea (adult) (pediatric): Secondary | ICD-10-CM | POA: Diagnosis not present

## 2023-06-04 NOTE — Addendum Note (Signed)
 Addended by: Luellen Pucker on: 06/04/2023 04:48 PM   Modules accepted: Orders

## 2023-06-04 NOTE — Patient Instructions (Signed)
 Medication Instructions:  Your physician recommends that you continue on your current medications as directed. Please refer to the Current Medication list given to you today.  *If you need a refill on your cardiac medications before your next appointment, please call your pharmacy*   Lab Work: None.   If you have labs (blood work) drawn today and your tests are completely normal, you will receive your results only by: MyChart Message (if you have MyChart) OR A paper copy in the mail If you have any lab test that is abnormal or we need to change your treatment, we will call you to review the results.   Testing/Procedures: Your physician has recommended that you have an in-lab sleep study called a cpap titration. This test records several body functions during sleep, including: brain activity, eye movement, oxygen and carbon dioxide blood levels, heart rate and rhythm, breathing rate and rhythm, the flow of air through your mouth and nose, snoring, body muscle movements, and chest and belly movement.    Follow-Up: At [facility], you and your health needs are our priority.  As part of our continuing mission to provide you with exceptional heart care, we have created designated Provider Care Teams.  These Care Teams include your primary Cardiologist (physician) and Advanced Practice Providers (APPs -  Physician Assistants and Nurse Practitioners) who all work together to provide you with the care you need, when you need it.  We recommend signing up for the patient portal called MyChart.  Sign up information is provided on this After Visit Summary.  MyChart is used to connect with patients for Virtual Visits (Telemedicine).  Patients are able to view lab/test results, encounter notes, upcoming appointments, etc.  Non-urgent messages can be sent to your provider as well.   To learn more about what you can do with MyChart, go to [email or web address].    Your next appointment will  be dependent on the results of your testing and it will be with:   Provider:   Dr. Wilbert Bihari, MD

## 2023-06-04 NOTE — Progress Notes (Signed)
 Sleep Medicine CONSULT Note    Date:  06/04/2023   ID:  Danielle [name], DOB June 20, 1945, MRN [id number]  PCP:  Rexanne Ingle, MD  Cardiologist: Toribio Fuel, MD   Chief Complaint  Patient presents with   New Patient (Initial Visit)    OSA    History of Present Illness:  Danielle [name] is a 78 y.o. female who is being seen today for the evaluation of OSA at the request of Toribio Fuel, MD.  This is a 78 year old female with a hx of chronic combined systolic/diastolic CHF, diabetes mellitus, hypertension and obstructive sleep apnea.  She apparently had a history of sleep apnea in the past and was referred by Advanced heart failure service for sleep study.  This was due to excessive daytime fatigue and snoring.  Sleep study showed severe obstructive sleep apnea with an AHI of 75.6/h and mild central sleep apnea with a CAI of 7.7/h. Oxygen saturations dropped as low as 82%.  There was moderate snoring.  She he underwent CPAP titration to 9 cm H2O.   She was lost to follow-up after I saw her in June 2021.  She has now been referred back to for sleep medicine consultation to reestablish sleep care and get a new machine.  Her device is over 70 years old and she would like a new device.   She tells me that her water  chamber has become discolored on the bottoms and makes the water  change color. She tolerates the nasal pillow mask but her mouth and nose have become dry and she has been getting eye irritation because it blows in her eyes.       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HCC) 02/05/2017   Acute gout due to renal impairment involving right wrist 05/20/2018   Acute kidney failure (HCC) 05/15/2018   Arthritis    all over (05/22/2018)   Blood dyscrasia    per pt-has small blood cells-appears as if anemic   Breast cancer, left breast (HCC) 1985   CHF (congestive heart failure) (HCC)    Complication of anesthesia     difficult to awaken from per pt   Coronary artery disease    Dyspnea    Essential hypertension    Heart disease    Heart murmur    History of gout    Hypothyroidism    Obesity (BMI 30-39.9) 11/11/2017   OSA (obstructive sleep apnea) 02/18/2017    severe obstructive sleep apnea with an AHI of 75.6/h and mild central sleep apnea with a CAI of 7.7/h.  Oxygen saturations dropped as low as 82%.   He is on CPAP at 9 cm H2O.   OSA on CPAP    Personal history of chemotherapy    Personal history of radiation therapy    Presence of permanent cardiac pacemaker    Primary localized osteoarthritis of right knee 05/13/2018   Refusal of blood transfusions as patient is Jehovah's Witness    Type II diabetes mellitus (HCC)     Past Surgical History:  Procedure Laterality Date   ABDOMINAL AORTOGRAM W/LOWER EXTREMITY N/A 07/30/2022   Procedure: ABDOMINAL AORTOGRAM W/LOWER EXTREMITY;  Surgeon: Sheree Penne Bruckner, MD;  [facility] INVASIVE CV LAB;  Service: Cardiovascular;  Laterality: N/A;   APPLICATION OF WOUND VAC Right 04/14/2021   Procedure: APPLICATION OF WOUND VAC;  Surgeon: Edna Toribio LABOR, MD;  Location: [facility] OR;  Service: Orthopedics;  Laterality: Right;  BREAST BIOPSY Left 1985   BREAST LUMPECTOMY Left 1985   BREAST LUMPECTOMY WITH NEEDLE LOCALIZATION AND AXILLARY LYMPH NODE DISSECTION  1985   CATARACT EXTRACTION W/ INTRAOCULAR LENS  IMPLANT, BILATERAL Bilateral    CORONARY ANGIOPLASTY WITH STENT PLACEMENT  2010, 2012,2017    2 done 2010 and 1 replaced 2012 and 2 replaced in 2017   DILATION AND CURETTAGE OF UTERUS     EXCISIONAL TOTAL KNEE ARTHROPLASTY WITH ANTIBIOTIC SPACERS Right 07/12/2021   Procedure: EXCISIONAL TOTAL KNEE ARTHROPLASTY WITH ANTIBIOTIC SPACERS;  Surgeon: Edna Toribio LABOR, MD;  Location: [facility] OR;  Service: Orthopedics;  Laterality: Right;   I & D KNEE WITH POLY EXCHANGE Right 04/14/2021   Procedure: IRRIGATION AND DEBRIDEMENT KNEE WITH POLY EXCHANGE;   Surgeon: Edna Toribio LABOR, MD;  Location: [facility] OR;  Service: Orthopedics;  Laterality: Right;   INSERT / REPLACE / REMOVE PACEMAKER  07/19/2014   JOINT REPLACEMENT     PERIPHERAL VASCULAR ATHERECTOMY  07/30/2022   Procedure: PERIPHERAL VASCULAR ATHERECTOMY;  Surgeon: Sheree Penne Bruckner, MD;  Location: [facility] INVASIVE CV LAB;  Service: Cardiovascular;;   PERIPHERAL VASCULAR INTERVENTION  07/30/2022   Procedure: PERIPHERAL VASCULAR INTERVENTION;  Surgeon: Sheree Penne Bruckner, MD;  Location: [facility] INVASIVE CV LAB;  Service: Cardiovascular;;   REPLACEMENT TOTAL KNEE Left 09/2012   THYROIDECTOMY, PARTIAL  1978   TONSILLECTOMY     TOTAL KNEE ARTHROPLASTY Right 05/13/2018   Procedure: TOTAL KNEE ARTHROPLASTY;  Surgeon: Josefina Chew, MD;  Location: [facility] ORS;  Service: Orthopedics;  Laterality: Right;   TOTAL KNEE REVISION Right 12/20/2021   Procedure: REMOVAL OF RIGHT KNEE ANTIBIOTIC SPACER AND REVISION RIGHT KNEE TOTAL ARTHROPLASTY;  Surgeon: Edna Toribio LABOR, MD;  Location: [facility] OR;  Service: Orthopedics;  Laterality: Right;   TUBAL LIGATION      Current Medications: Current Meds  Medication Sig   acetaminophen  (TYLENOL ) 500 MG tablet Take 500-1,000 mg by mouth every 6 (six) hours as needed (for pain or headaches).   allopurinol  (ZYLOPRIM ) 100 MG tablet Take 100 mg by mouth daily.   apixaban  (ELIQUIS ) 5 MG TABS tablet Take 1 tablet (5 mg total) by mouth 2 (two) times daily.   atorvastatin  (LIPITOR ) 80 MG tablet TAKE 1 TABLET BY MOUTH AT BEDTIME   B Complex-Biotin -FA (B-COMPLEX PO) Take 1 tablet by mouth daily.   Biotin  10 MG TABS Take 10 mg by mouth daily.   Blood Glucose Monitoring Suppl (ONETOUCH VERIO) w/Device KIT Use to check blood sugar   carvedilol  (COREG ) 3.125 MG tablet Take 1 tablet (3.125 mg total) by mouth 2 (two) times daily with a meal.   Cinnamon 500 MG capsule Take 500 mg by mouth daily.   clotrimazole-betamethasone (LOTRISONE) lotion Apply 1 application topically 2 (two)  times daily as needed (itching).   Continuous Glucose Sensor (DEXCOM G6 SENSOR) MISC 1 Device by Does not apply route as directed.   Continuous Glucose Transmitter (DEXCOM G6 TRANSMITTER) MISC Change every 90 days   empagliflozin  (JARDIANCE ) 10 MG TABS tablet Take 1 tablet (10 mg total) by mouth daily.   ferrous sulfate  325 (65 FE) MG tablet Take 325 mg by mouth 3 (three) times a week.   folic acid  (FOLVITE ) 1 MG tablet Take 1 tablet by mouth once daily   gabapentin  (NEURONTIN ) 300 MG capsule Take 300 mg by mouth 2 (two) times daily.   glucose blood (ONETOUCH VERIO) test strip Check blood sugar 2 times daily   insulin  aspart (NOVOLOG  FLEXPEN) 100 UNIT/ML FlexPen DIAL  AND INJECT  UNDER THE SKIN THREE TIMES DAILY PER CORRECTION SCALE. MAX DAILY DOSE 50 UNITS   Insulin  Glargine (BASAGLAR  KWIKPEN) 100 UNIT/ML Inject 40 Units into the skin at bedtime.   insulin  lispro (HUMALOG  KWIKPEN) 100 UNIT/ML KwikPen Take HUmalog  8 units with each meal PLUS Humalog  correctional insulin  scale max daily dose 50 units   Insulin  Lispro (HUMALOG  [place]) Inject 8 Units into the skin in the morning, at noon, and at bedtime.   Insulin  Pen Needle 32G X 4 MM MISC 1 Device by Does not apply route in the morning, at noon, in the evening, and at bedtime.   Lancets (ONETOUCH DELICA PLUS LANCET33G) MISC USE   TO CHECK GLUCOSE 4 TIMES DAILY   latanoprost  (XALATAN ) 0.005 % ophthalmic solution Place 1 drop into both eyes at bedtime.   levothyroxine  (SYNTHROID , LEVOTHROID) 75 MCG tablet Take 75 mcg by mouth daily before breakfast.   LUMIGAN 0.01 % SOLN Place 1 drop into both eyes at bedtime.   methylPREDNISolone  (MEDROL  DOSEPAK) 4 MG TBPK tablet Take as directed   Multiple Vitamins-Minerals (ALIVE ONCE DAILY WOMENS 50+ PO) Take 1 tablet by mouth daily.   ondansetron  (ZOFRAN ) 4 MG tablet Take 1 tablet (4 mg total) by mouth every 8 (eight) hours as needed for nausea or vomiting.   spironolactone  (ALDACTONE ) 25 MG tablet Take 1 tablet (25  mg total) by mouth daily.   torsemide  (DEMADEX ) 20 MG tablet Take 2 tablets (40 mg total) by mouth daily.   Travoprost, BAK Free, (TRAVATAN) 0.004 % SOLN ophthalmic solution Place 1 drop into both eyes at bedtime.    Allergies:   Codeine, Other, Tape, Sulfa antibiotics, and Uloric  [febuxostat ]   Social History   Socioeconomic History   Marital status: Widowed    Spouse name: Not on file   Number of children: 3   Years of education: Not on file   Highest education level: Some college, no degree  Occupational History   Occupation: retired  Tobacco Use   Smoking status: Former    Current packs/day: 0.00    Average packs/day: 1 pack/day for 22.0 years (22.0 ttl pk-yrs)    Types: Cigarettes    Start date: 48    Quit date: 1983    Years since quitting: 42.0   Smokeless tobacco: Never  Vaping Use   Vaping status: Never Used  Substance and Sexual Activity   Alcohol use: Yes    Comment: 05/22/2018 glass of wine couple times/wk   Drug use: Not Currently   Sexual activity: Not Currently    Birth control/protection: None, Post-menopausal  Other Topics Concern   Not on file  Social History Narrative   Not on file   Social Drivers of Health   Financial Resource Strain: Medium Risk (12/22/2021)   Overall Financial Resource Strain (CARDIA)    Difficulty of Paying Living Expenses: Somewhat hard  Food Insecurity: No Food Insecurity (05/08/2018)   Hunger Vital Sign    Worried About Running Out of Food in the Last Year: Never true    Ran Out of Food in the Last Year: Never true  Transportation Needs: Unmet Transportation Needs (05/08/2022)   PRAPARE - Administrator, Civil Service (Medical): Yes    Lack of Transportation (Non-Medical): Yes  Physical Activity: Not on file  Stress: Not on file  Social Connections: Not on file     Family History:  The patient's family history includes Diabetes in her sister; Heart disease in her brother and father; Multiple myeloma  in  her mother; Other in her sister and sister.   ROS:   Please see the history of present illness.    ROS All other systems reviewed and are negative.      No data to display             PHYSICAL EXAM:   VS:  BP 122/74   Pulse 74   Ht 5' 4 (1.626 m)   Wt 199 lb (90.3 kg)   SpO2 98%   BMI 34.16 kg/m    GEN: Well nourished, well developed, in no acute distress  HEENT: normal  Neck: no JVD, carotid bruits, or masses Cardiac: RRR; no murmurs, rubs, or gallops,no edema.  Intact distal pulses bilaterally.  Respiratory:  clear to auscultation bilaterally, normal work of breathing GI: soft, nontender, nondistended, + BS MS: no deformity or atrophy  Skin: warm and dry, no rash Neuro:  Alert and Oriented x 3, Strength and sensation are intact Psych: euthymic mood, full affect  Wt Readings from Last 3 Encounters:  06/04/23 199 lb (90.3 kg)  05/30/23 195 lb (88.5 kg)  05/17/23 202 lb (91.6 kg)      Studies/Labs Reviewed:   PAP compliance download  Recent Labs: 01/29/2023: B Natriuretic Peptide 120.1; Hemoglobin 11.1; Platelets 208 02/08/2023: BUN 23; Creatinine, Ser 1.64; Potassium 4.3; Sodium 138     ASSESSMENT:    1. OSA (obstructive sleep apnea)   2. Essential hypertension      PLAN:  In order of problems listed above:  OSA - The patient is tolerating PAP therapy well without any problems. The PAP download performed by his DME was personally reviewed and interpreted by me today and showed an AHI of 20/hr on 9 cm H2O with 27% compliance in using more than 4 hours nightly.  The patient has been using and benefiting from PAP use and will continue to benefit from therapy.  -She appears to have a very large mask leak on her download with a maximum 105.9 L/min and 95th percentile 39.7 L/min.  She uses a nasal pillow mask that leaks and irritates her eyes and has mouth and nasal dryness -I have recommended sending her back to the sleep lab and get a CPAP titration prior to  ordering a new device  HTN -BP controlled on exam -continue prescription drug management with Carvedilol  3.125mg  BID, spiro 25mg  daily with PRN refills  Time Spent: 20 minutes total time of encounter, including 15 minutes spent in face-to-face patient care on the date of this encounter. This time includes coordination of care and counseling regarding above mentioned problem list. Remainder of non-face-to-face time involved reviewing chart documents/testing relevant to the patient encounter and documentation in the medical record. I have independently reviewed documentation from referring provider  Medication Adjustments/Labs and Tests Ordered: Current medicines are reviewed at length with the patient today.  Concerns regarding medicines are outlined above.  Medication changes, Labs and Tests ordered today are listed in the Patient Instructions below.  There are no Patient Instructions on file for this visit.   Signed, Wilbert Bihari, MD  06/04/2023 4:39 PM    [facility] [address] [place], [place], [place]  [address] Phone: [phone]; Fax: [phone]

## 2023-06-06 ENCOUNTER — Other Ambulatory Visit (HOSPITAL_COMMUNITY): Payer: Self-pay

## 2023-06-06 NOTE — Progress Notes (Signed)
 Paramedicine Encounter    Patient ID: Danielle [name], female    DOB: 08/12/1945, 78 y.o.   MRN: [id number]   Complaints- None   Assessment- CAOX4, warm and dry, no complaints, no shortness of breath, no lower leg swelling more than baseline.   Compliance with meds- no missed doses of meds   Pill box filled- for one week   Refills needed- none   Meds changes since last visit- Taking prednisone  for gout flare up- is helping.    Social changes-  - Eliquis  assistance denied for 2024 -Needs healthwell for Jardiance ,Spiro    VISIT SUMMARY**  Arrived for home visit for [facility] where she met me at the door with no shortness of breath, no swelling, she denied any complaints. I reviewed meds and filled pill box for one week. We reviewed upcoming appointments and confirmed same. I also reviewed diabetic management with Raeann.Home visit complete. I will see Jalexa one week.   Wt 196 lb (88.9 kg)   BMI 33.64 kg/m  Weight yesterday-- 199lbs Last visit weight-- 195lbs     ACTION: Home visit completed     Patient Care Team: Rexanne Ingle, MD as PCP - General (Internal Medicine) Inocencio Soyla Lunger, MD as PCP - Electrophysiology (Cardiology) Shlomo Wilbert SAUNDERS, MD as PCP - Sleep Medicine (Cardiology) Bensimhon, Toribio SAUNDERS, MD as PCP - Cardiology (Cardiology) Cathern Andriette DEL, LCSW as Social Worker (Licensed Clinical Social Worker) Shamleffer, Donell Cardinal, MD as Consulting Physician (Endocrinology)  Patient Active Problem List   Diagnosis Date Noted   Chronic systolic heart failure (HCC) 05/08/2022   Type 2 diabetes mellitus with stage 3a chronic kidney disease, with long-term current use of insulin  (HCC) 02/16/2022   Prosthetic joint implant failure, initial encounter (HCC) 12/20/2021   Pseudophakia of both eyes 11/15/2021   DVT (deep venous thrombosis) (HCC) 07/31/2021   Acute blood loss anemia    Septic Arthritis, Infection of prosthetic right knee joint (HCC), MSSA  infection 07/10/2021   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) 07/10/2021   CAD (coronary artery disease) 06/08/2021   Carotid artery disease (HCC) 06/08/2021   Symptomatic bradycardia 06/08/2021   Paroxysmal atrial fibrillation (HCC) 06/08/2021   CKD (chronic kidney disease), stage III (HCC) 06/08/2021   Polyneuropathy associated with underlying disease (HCC) 05/04/2020   Severe nonproliferative diabetic retinopathy of left eye, with macular edema, associated with type 2 diabetes mellitus (HCC) 11/05/2019   Severe nonproliferative diabetic retinopathy of right eye, with macular edema, associated with type 2 diabetes mellitus (HCC) 11/05/2019   Retinal hemorrhage of right eye 11/05/2019   Retinal hemorrhage of left eye 11/05/2019   Diabetes mellitus (HCC) 05/14/2019   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, with long-term current use of insulin  (HCC) 05/14/2019   Type 2 diabetes mellitus with diabetic polyneuropathy, with long-term current use of insulin  (HCC) 05/14/2019   History of gout 05/20/2018   Primary localized osteoarthritis of right knee 05/13/2018   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) 12/20/2017   Obesity (BMI 30-39.9) 11/11/2017   OSA (obstructive sleep apnea) 02/18/2017   Essential hypertension    L Breast Cancer    Hypothyroidism     Current Outpatient Medications:    acetaminophen  (TYLENOL ) 500 MG tablet, Take 500-1,000 mg by mouth every 6 (six) hours as needed (for pain or headaches)., Disp: , Rfl:    allopurinol  (ZYLOPRIM ) 100 MG tablet, Take 100 mg by mouth daily., Disp: , Rfl:    apixaban  (ELIQUIS ) 5 MG TABS tablet, Take  1 tablet (5 mg total) by mouth 2 (two) times daily., Disp: 60 tablet, Rfl: 11   atorvastatin  (LIPITOR ) 80 MG tablet, TAKE 1 TABLET BY MOUTH AT BEDTIME, Disp: 90 tablet, Rfl: 3   B Complex-Biotin -FA (B-COMPLEX PO), Take 1 tablet by mouth daily., Disp: , Rfl:    Biotin  10 MG TABS, Take 10 mg by mouth daily.,  Disp: , Rfl:    Blood Glucose Monitoring Suppl (ONETOUCH VERIO) w/Device KIT, Use to check blood sugar, Disp: 1 kit, Rfl: 0   carvedilol  (COREG ) 3.125 MG tablet, Take 1 tablet (3.125 mg total) by mouth 2 (two) times daily with a meal., Disp: 180 tablet, Rfl: 3   Cinnamon 500 MG capsule, Take 500 mg by mouth daily., Disp: , Rfl:    clotrimazole-betamethasone (LOTRISONE) lotion, Apply 1 application topically 2 (two) times daily as needed (itching)., Disp: , Rfl:    Continuous Glucose Sensor (DEXCOM G6 SENSOR) MISC, 1 Device by Does not apply route as directed., Disp: 9 each, Rfl: 3   Continuous Glucose Transmitter (DEXCOM G6 TRANSMITTER) MISC, Change every 90 days, Disp: 1 each, Rfl: 3   empagliflozin  (JARDIANCE ) 10 MG TABS tablet, Take 1 tablet (10 mg total) by mouth daily., Disp: 30 tablet, Rfl: 11   ferrous sulfate  325 (65 FE) MG tablet, Take 325 mg by mouth 3 (three) times a week., Disp: , Rfl:    folic acid  (FOLVITE ) 1 MG tablet, Take 1 tablet by mouth once daily, Disp: 90 tablet, Rfl: 3   gabapentin  (NEURONTIN ) 300 MG capsule, Take 300 mg by mouth 2 (two) times daily., Disp: , Rfl:    glucose blood (ONETOUCH VERIO) test strip, Check blood sugar 2 times daily, Disp: 100 each, Rfl: 12   insulin  aspart (NOVOLOG  FLEXPEN) 100 UNIT/ML FlexPen, DIAL  AND INJECT UNDER THE SKIN THREE TIMES DAILY PER CORRECTION SCALE. MAX DAILY DOSE 50 UNITS, Disp: 15 mL, Rfl: 0   Insulin  Glargine (BASAGLAR  KWIKPEN) 100 UNIT/ML, Inject 40 Units into the skin at bedtime., Disp: 45 mL, Rfl: 2   insulin  lispro (HUMALOG  KWIKPEN) 100 UNIT/ML KwikPen, Take HUmalog  8 units with each meal PLUS Humalog  correctional insulin  scale max daily dose 50 units, Disp: 30 mL, Rfl: 3   Insulin  Lispro (HUMALOG  [place]), Inject 8 Units into the skin in the morning, at noon, and at bedtime., Disp: , Rfl:    Insulin  Pen Needle 32G X 4 MM MISC, 1 Device by Does not apply route in the morning, at noon, in the evening, and at bedtime., Disp: 400 each,  Rfl: 3   Lancets (ONETOUCH DELICA PLUS LANCET33G) MISC, USE   TO CHECK GLUCOSE 4 TIMES DAILY, Disp: 100 each, Rfl: 3   latanoprost  (XALATAN ) 0.005 % ophthalmic solution, Place 1 drop into both eyes at bedtime., Disp: , Rfl:    levothyroxine  (SYNTHROID , LEVOTHROID) 75 MCG tablet, Take 75 mcg by mouth daily before breakfast., Disp: , Rfl:    LUMIGAN 0.01 % SOLN, Place 1 drop into both eyes at bedtime., Disp: , Rfl:    methylPREDNISolone  (MEDROL  DOSEPAK) 4 MG TBPK tablet, Take as directed, Disp: 21 tablet, Rfl: 0   Multiple Vitamins-Minerals (ALIVE ONCE DAILY WOMENS 50+ PO), Take 1 tablet by mouth daily., Disp: , Rfl:    ondansetron  (ZOFRAN ) 4 MG tablet, Take 1 tablet (4 mg total) by mouth every 8 (eight) hours as needed for nausea or vomiting., Disp: 20 tablet, Rfl: 0   spironolactone  (ALDACTONE ) 25 MG tablet, Take 1 tablet (25 mg total) by mouth daily.,  Disp: 90 tablet, Rfl: 3   torsemide  (DEMADEX ) 20 MG tablet, Take 2 tablets (40 mg total) by mouth daily., Disp: 60 tablet, Rfl: 11   Travoprost, BAK Free, (TRAVATAN) 0.004 % SOLN ophthalmic solution, Place 1 drop into both eyes at bedtime., Disp: , Rfl:  Allergies  Allergen Reactions   Codeine Other (See Comments)   Other Other (See Comments)    Blood Product Refusal (Jehovah's witness)     Tape Other (See Comments)    Leaves blisters and marks on the skin   Sulfa Antibiotics Rash   Uloric  [Febuxostat ] Rash     Social History   Socioeconomic History   Marital status: Widowed    Spouse name: Not on file   Number of children: 3   Years of education: Not on file   Highest education level: Some college, no degree  Occupational History   Occupation: retired  Tobacco Use   Smoking status: Former    Current packs/day: 0.00    Average packs/day: 1 pack/day for 22.0 years (22.0 ttl pk-yrs)    Types: Cigarettes    Start date: 44    Quit date: 4    Years since quitting: 42.0   Smokeless tobacco: Never  Vaping Use   Vaping status:  Never Used  Substance and Sexual Activity   Alcohol use: Yes    Comment: 05/22/2018 glass of wine couple times/wk   Drug use: Not Currently   Sexual activity: Not Currently    Birth control/protection: None, Post-menopausal  Other Topics Concern   Not on file  Social History Narrative   Not on file   Social Drivers of Health   Financial Resource Strain: Medium Risk (12/22/2021)   Overall Financial Resource Strain (CARDIA)    Difficulty of Paying Living Expenses: Somewhat hard  Food Insecurity: No Food Insecurity (05/08/2018)   Hunger Vital Sign    Worried About Running Out of Food in the Last Year: Never true    Ran Out of Food in the Last Year: Never true  Transportation Needs: Unmet Transportation Needs (05/08/2022)   PRAPARE - Administrator, Civil Service (Medical): Yes    Lack of Transportation (Non-Medical): Yes  Physical Activity: Not on file  Stress: Not on file  Social Connections: Not on file  Intimate Partner Violence: Not on file    Physical Exam      Future Appointments  Date Time Provider Department Center  06/07/2023  7:05 AM [facility] DEVICE REMOTES [facility]  06/17/2023 11:00 AM Joya Stabs, DPM [facility]  07/17/2023 10:00 AM [facility] VASC 6 [facility]  07/17/2023 10:30 AM [facility] PA [facility]  09/06/2023  7:05 AM [facility] DEVICE REMOTES [facility]  12/06/2023  7:05 AM [facility] DEVICE REMOTES [facility]  03/06/2024  7:05 AM [facility] DEVICE REMOTES [facility]  06/05/2024  7:05 AM [facility] DEVICE REMOTES [facility]  09/04/2024  7:05 AM [facility] DEVICE REMOTES [facility]  12/04/2024  7:05 AM [facility] DEVICE REMOTES [facility]  03/05/2025  7:05 AM [facility] DEVICE REMOTES [facility]  06/04/2025  7:05 AM [facility] DEVICE REMOTES [facility]  09/03/2025  7:05 AM [facility] DEVICE REMOTES  [facility]  12/03/2025  7:05 AM [facility] DEVICE REMOTES [facility]

## 2023-06-12 ENCOUNTER — Other Ambulatory Visit (HOSPITAL_COMMUNITY): Payer: Self-pay | Admitting: Cardiology

## 2023-06-12 MED ORDER — ATORVASTATIN CALCIUM 80 MG PO TABS: 80.0000 mg | ORAL_TABLET | Freq: Every day | ORAL | 3 refills | Status: DC

## 2023-06-13 ENCOUNTER — Other Ambulatory Visit (HOSPITAL_COMMUNITY): Payer: Self-pay

## 2023-06-13 ENCOUNTER — Ambulatory Visit (INDEPENDENT_AMBULATORY_CARE_PROVIDER_SITE_OTHER): Payer: Medicare HMO

## 2023-06-13 DIAGNOSIS — I495 Sick sinus syndrome: Secondary | ICD-10-CM | POA: Diagnosis not present

## 2023-06-13 LAB — CUP PACEART REMOTE DEVICE CHECK
Battery Remaining Longevity: 33 mo
Battery Voltage: 2.94 V
Brady Statistic AP VP Percent: 0.01 %
Brady Statistic AP VS Percent: 4.76 %
Brady Statistic AS VP Percent: 0.06 %
Brady Statistic AS VS Percent: 95.18 %
Brady Statistic RA Percent Paced: 4.76 %
Brady Statistic RV Percent Paced: 0.07 %
Date Time Interrogation Session: 20250115192719
Implantable Lead Connection Status: 753985
Implantable Lead Connection Status: 753985
Implantable Lead Implant Date: 20160222
Implantable Lead Implant Date: 20160222
Implantable Lead Location: 753859
Implantable Lead Location: 753860
Implantable Lead Model: 5076
Implantable Lead Model: 5076
Implantable Pulse Generator Implant Date: 20160222
Lead Channel Impedance Value: 399 Ohm
Lead Channel Impedance Value: 418 Ohm
Lead Channel Impedance Value: 437 Ohm
Lead Channel Impedance Value: 494 Ohm
Lead Channel Pacing Threshold Amplitude: 0.625 V
Lead Channel Pacing Threshold Amplitude: 0.625 V
Lead Channel Pacing Threshold Pulse Width: 0.4 ms
Lead Channel Pacing Threshold Pulse Width: 0.4 ms
Lead Channel Sensing Intrinsic Amplitude: 16.75 mV
Lead Channel Sensing Intrinsic Amplitude: 16.75 mV
Lead Channel Sensing Intrinsic Amplitude: 5.5 mV
Lead Channel Sensing Intrinsic Amplitude: 5.5 mV
Lead Channel Setting Pacing Amplitude: 2 V
Lead Channel Setting Pacing Amplitude: 2 V
Lead Channel Setting Pacing Pulse Width: 0.4 ms
Lead Channel Setting Sensing Sensitivity: 2 mV
Zone Setting Status: 755011
Zone Setting Status: 755011

## 2023-06-13 NOTE — Progress Notes (Signed)
Paramedicine Encounter    Patient ID: Danielle [name], female    DOB: 06-13-1945, 78 y.o.   MRN: [id number]   Complaints- NONE   Assessment- CAOX4, WARM AND DRY, AMBULATORY WITH NO SHORTNESS OF BREATH, NO CHEST PAIN, NO DIZZINESS, NO LOWER LEG SWELLING (WEARING COMPRESSION SOCKS). LUNGS CLEAR, VITALS OBTAINED.   Compliance with meds- SKIPPED FERROUS SULFATE FOR MONDAY AND WEDS DUE TO CONSTIPATION.   Pill box filled- ONE WEEK   Refills needed- FOLIC ACID, ELIQUIS   Meds changes since last visit- NONE     Social changes-  SPOKE TO [employer] ABOUT ELIQUIS COPAY FOR Danielle [name]- $737 FOR A 30 DAY SUPPLY    VISIT SUMMARY**  ARRIVED FOR HOME VISIT FOR Danielle [name] WHO REPORTS SHE HAS BEEN FEELING WELL WITH NO COMPLAINTS TODAY. SHE DID REPORT SOME CONSTIPATION EARLIER THIS WEEK WITH SOME ABDOMINAL PAIN BUT TOOK A STOOL SOFTENER SHE HAD SOME RELIEF. SHE IS NOW HAVING REGULAR BOWEL MOVEMENTS. SHE SAYS THIS IS DUE TO THE IRON PILL- SHE SKIPPED TWO DAYS OF SAME IN THE LAST WEEK. I OBTAINED VITALS AND ASSESSMENT. NO LOWER LEG SWELLING, LUNGS CLEAR. I REVIEWED MEDS AND CONFIRMED SAME FILLING PILL BOX FOR ONE WEEK. I CONTACTED [employer] ABOUT REFILLS AND ELIQUIS COPAY- ELIQUIS COPAY IS $737 FOR ONE MONTH- [employer] IS NOT COVERING SAME. I WILL REACH OUT TO [facility] CL[facility] ABOUT SAME TO SEE PLAN GOING FORWARD. WE DISCUSSED UPCOMING APPOINTMENTS AND CONFIRMED SAME. HOME VISIT COMPLETE. [name].   CBG- 166   There were no vitals taken for this visit. Weight yesterday--195LBS Last visit weight-- 196LBS      ACTION: Home visit completed     Patient Care Team: Renford Dills, MD as PCP - General (Internal Medicine) Regan Lemming, MD as PCP - Electrophysiology (Cardiology) Quintella Reichert, MD as PCP - Sleep Medicine (Cardiology) Bensimhon, Bevelyn Buckles, MD as PCP - Cardiology (Cardiology) Burna Sis, LCSW as Social Worker (Licensed Clinical Social Worker) Shamleffer, Konrad Dolores,  MD as Consulting Physician (Endocrinology)  Patient Active Problem List   Diagnosis Date Noted   Chronic systolic heart failure (HCC) 05/08/2022   Type 2 diabetes mellitus with stage 3a chronic kidney disease, with long-term current use of insulin (HCC) 02/16/2022   Prosthetic joint implant failure, initial encounter (HCC) 12/20/2021   Pseudophakia of both eyes 11/15/2021   DVT (deep venous thrombosis) (HCC) 07/31/2021   Acute blood loss anemia    Septic Arthritis, Infection of prosthetic right knee joint (HCC), MSSA infection 07/10/2021   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) 07/10/2021   CAD (coronary artery disease) 06/08/2021   Carotid artery disease (HCC) 06/08/2021   Symptomatic bradycardia 06/08/2021   Paroxysmal atrial fibrillation (HCC) 06/08/2021   CKD (chronic kidney disease), stage III (HCC) 06/08/2021   Polyneuropathy associated with underlying disease (HCC) 05/04/2020   Severe nonproliferative diabetic retinopathy of left eye, with macular edema, associated with type 2 diabetes mellitus (HCC) 11/05/2019   Severe nonproliferative diabetic retinopathy of right eye, with macular edema, associated with type 2 diabetes mellitus (HCC) 11/05/2019   Retinal hemorrhage of right eye 11/05/2019   Retinal hemorrhage of left eye 11/05/2019   Diabetes mellitus (HCC) 05/14/2019   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, with long-term current use of insulin (HCC) 05/14/2019   Type 2 diabetes mellitus with diabetic polyneuropathy, with long-term current use of insulin (HCC) 05/14/2019   History of gout 05/20/2018   Primary localized osteoarthritis of right knee 05/13/2018  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) 12/20/2017   Obesity (BMI 30-39.9) 11/11/2017   OSA (obstructive sleep apnea) 02/18/2017   Essential hypertension    L Breast Cancer    Hypothyroidism     Current Outpatient Medications:    acetaminophen (TYLENOL) 500 MG tablet, Take  500-1,000 mg by mouth every 6 (six) hours as needed (for pain or headaches)., Disp: , Rfl:    allopurinol (ZYLOPRIM) 100 MG tablet, Take 100 mg by mouth daily., Disp: , Rfl:    apixaban (ELIQUIS) 5 MG TABS tablet, Take 1 tablet (5 mg total) by mouth 2 (two) times daily., Disp: 60 tablet, Rfl: 11   atorvastatin (LIPITOR) 80 MG tablet, Take 1 tablet (80 mg total) by mouth at bedtime., Disp: 100 tablet, Rfl: 3   B Complex-Biotin-FA (B-COMPLEX PO), Take 1 tablet by mouth daily., Disp: , Rfl:    Biotin 10 MG TABS, Take 10 mg by mouth daily., Disp: , Rfl:    Blood Glucose Monitoring Suppl (ONETOUCH VERIO) w/Device KIT, Use to check blood sugar, Disp: 1 kit, Rfl: 0   carvedilol (COREG) 3.125 MG tablet, Take 1 tablet (3.125 mg total) by mouth 2 (two) times daily with a meal., Disp: 180 tablet, Rfl: 3   Cinnamon 500 MG capsule, Take 500 mg by mouth daily., Disp: , Rfl:    clotrimazole-betamethasone (LOTRISONE) lotion, Apply 1 application topically 2 (two) times daily as needed (itching)., Disp: , Rfl:    Continuous Glucose Sensor (DEXCOM G6 SENSOR) MISC, 1 Device by Does not apply route as directed., Disp: 9 each, Rfl: 3   Continuous Glucose Transmitter (DEXCOM G6 TRANSMITTER) MISC, Change every 90 days, Disp: 1 each, Rfl: 3   empagliflozin (JARDIANCE) 10 MG TABS tablet, Take 1 tablet (10 mg total) by mouth daily., Disp: 30 tablet, Rfl: 11   ferrous sulfate 325 (65 FE) MG tablet, Take 325 mg by mouth 3 (three) times a week., Disp: , Rfl:    folic acid (FOLVITE) 1 MG tablet, Take 1 tablet by mouth once daily, Disp: 90 tablet, Rfl: 3   gabapentin (NEURONTIN) 300 MG capsule, Take 300 mg by mouth 2 (two) times daily., Disp: , Rfl:    glucose blood (ONETOUCH VERIO) test strip, Check blood sugar 2 times daily, Disp: 100 each, Rfl: 12   insulin aspart (NOVOLOG FLEXPEN) 100 UNIT/ML FlexPen, DIAL AND INJECT UNDER THE SKIN THREE TIMES DAILY PER CORRECTION SCALE. MAX DAILY DOSE 50 UNITS, Disp: 15 mL, Rfl: 0   Insulin  Glargine (BASAGLAR KWIKPEN) 100 UNIT/ML, Inject 40 Units into the skin at bedtime., Disp: 45 mL, Rfl: 2   insulin lispro (HUMALOG KWIKPEN) 100 UNIT/ML KwikPen, Take HUmalog 8 units with each meal PLUS Humalog correctional insulin scale max daily dose 50 units, Disp: 30 mL, Rfl: 3   Insulin Lispro (HUMALOG [place]), Inject 8 Units into the skin in the morning, at noon, and at bedtime., Disp: , Rfl:    Insulin Pen Needle 32G X 4 MM MISC, 1 Device by Does not apply route in the morning, at noon, in the evening, and at bedtime., Disp: 400 each, Rfl: 3   Lancets (ONETOUCH DELICA PLUS LANCET33G) MISC, USE   TO CHECK GLUCOSE 4 TIMES DAILY, Disp: 100 each, Rfl: 3   latanoprost (XALATAN) 0.005 % ophthalmic solution, Place 1 drop into both eyes at bedtime., Disp: , Rfl:    levothyroxine (SYNTHROID, LEVOTHROID) 75 MCG tablet, Take 75 mcg by mouth daily before breakfast., Disp: , Rfl:    LUMIGAN  0.01 % SOLN, Place 1 drop into both eyes at bedtime., Disp: , Rfl:    methylPREDNISolone (MEDROL DOSEPAK) 4 MG TBPK tablet, Take as directed, Disp: 21 tablet, Rfl: 0   Multiple Vitamins-Minerals (ALIVE ONCE DAILY WOMENS 50+ PO), Take 1 tablet by mouth daily., Disp: , Rfl:    ondansetron (ZOFRAN) 4 MG tablet, Take 1 tablet (4 mg total) by mouth every 8 (eight) hours as needed for nausea or vomiting., Disp: 20 tablet, Rfl: 0   spironolactone (ALDACTONE) 25 MG tablet, Take 1 tablet (25 mg total) by mouth daily., Disp: 90 tablet, Rfl: 3   torsemide (DEMADEX) 20 MG tablet, Take 2 tablets (40 mg total) by mouth daily., Disp: 60 tablet, Rfl: 11   Travoprost, BAK Free, (TRAVATAN) 0.004 % SOLN ophthalmic solution, Place 1 drop into both eyes at bedtime., Disp: , Rfl:  Allergies  Allergen Reactions   Codeine Other (See Comments)   Other Other (See Comments)    Blood Product Refusal (Jehovah's witness)     Tape Other (See Comments)    Leaves blisters and marks on the skin   Sulfa Antibiotics Rash   Uloric [Febuxostat] Rash      Social History   Socioeconomic History   Marital status: Widowed    Spouse name: Not on file   Number of children: 3   Years of education: Not on file   Highest education level: Some college, no degree  Occupational History   Occupation: retired  Tobacco Use   Smoking status: Former    Current packs/day: 0.00    Average packs/day: 1 pack/day for 22.0 years (22.0 ttl pk-yrs)    Types: Cigarettes    Start date: 31    Quit date: 63    Years since quitting: 42.0   Smokeless tobacco: Never  Vaping Use   Vaping status: Never Used  Substance and Sexual Activity   Alcohol use: Yes    Comment: 05/22/2018 "glass of wine couple times/wk"   Drug use: Not Currently   Sexual activity: Not Currently    Birth control/protection: None, Post-menopausal  Other Topics Concern   Not on file  Social History Narrative   Not on file   Social Drivers of Health   Financial Resource Strain: Medium Risk (12/22/2021)   Overall Financial Resource Strain (CARDIA)    Difficulty of Paying Living Expenses: Somewhat hard  Food Insecurity: No Food Insecurity (05/08/2018)   Hunger Vital Sign    Worried About Running Out of Food in the Last Year: Never true    Ran Out of Food in the Last Year: Never true  Transportation Needs: Unmet Transportation Needs (05/08/2022)   PRAPARE - Administrator, Civil Service (Medical): Yes    Lack of Transportation (Non-Medical): Yes  Physical Activity: Not on file  Stress: Not on file  Social Connections: Not on file  Intimate Partner Violence: Not on file    Physical Exam      Future Appointments  Date Time Provider Department Center  06/17/2023 11:00 AM Louann Sjogren, DPM [facility]  07/17/2023 10:00 AM [facility]  07/17/2023 10:30 AM [facility]  09/12/2023  7:15 AM [facility] DEVICE REMOTES [facility]  12/12/2023  7:15 AM [facility] DEVICE REMOTES [facility]   03/12/2024  7:15 AM [facility] DEVICE REMOTES [facility]  06/11/2024  7:15 AM [facility] DEVICE REMOTES [facility]  09/10/2024  7:15 AM [facility] DEVICE REMOTES [facility]  12/10/2024  7:15 AM [facility]  DEVICE REMOTES [facility]  03/11/2025  7:15 AM [facility] DEVICE REMOTES [facility]  06/10/2025  7:15 AM [facility] DEVICE REMOTES [facility]  09/09/2025  7:15 AM [facility] DEVICE REMOTES [facility]

## 2023-06-17 ENCOUNTER — Encounter: Payer: Self-pay | Admitting: Podiatry

## 2023-06-17 ENCOUNTER — Ambulatory Visit (INDEPENDENT_AMBULATORY_CARE_PROVIDER_SITE_OTHER): Payer: Medicare HMO | Admitting: Podiatry

## 2023-06-17 DIAGNOSIS — L84 Corns and callosities: Secondary | ICD-10-CM

## 2023-06-17 DIAGNOSIS — E1151 Type 2 diabetes mellitus with diabetic peripheral angiopathy without gangrene: Secondary | ICD-10-CM | POA: Diagnosis not present

## 2023-06-17 DIAGNOSIS — L97521 Non-pressure chronic ulcer of other part of left foot limited to breakdown of skin: Secondary | ICD-10-CM | POA: Diagnosis not present

## 2023-06-17 DIAGNOSIS — Z87898 Personal history of other specified conditions: Secondary | ICD-10-CM

## 2023-06-17 NOTE — Progress Notes (Signed)
Subjective:  Patient ID: Danielle [name], female    DOB: March 28, 1946,  MRN: [id number]  No chief complaint on file.   78 y.o. female returns for follow-up of left great toe wound.  Relates doing well around the second toe. Has been dressing the great toe as instructed. No new flares of gout related.   Has been dressing as instructed   Review of Systems: Negative except as noted in the HPI. Denies N/V/F/Ch.  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HCC) 02/05/2017   Acute gout due to renal impairment involving right wrist 05/20/2018   Acute kidney failure (HCC) 05/15/2018   Arthritis    "all over" (05/22/2018)   Blood dyscrasia    per pt-has small blood cells-appears as if anemic   Breast cancer, left breast (HCC) 1985   CHF (congestive heart failure) (HCC)    Complication of anesthesia    difficult to awaken from per pt   Coronary artery disease    Dyspnea    Essential hypertension    Heart disease    Heart murmur    History of gout    Hypothyroidism    Obesity (BMI 30-39.9) 11/11/2017   OSA (obstructive sleep apnea) 02/18/2017    severe obstructive sleep apnea with an AHI of 75.6/h and mild central sleep apnea with a CAI of 7.7/h.  Oxygen saturations dropped as low as 82%.   He is on CPAP at 9 cm H2O.   OSA on CPAP    Personal history of chemotherapy    Personal history of radiation therapy    Presence of permanent cardiac pacemaker    Primary localized osteoarthritis of right knee 05/13/2018   Refusal of blood transfusions as patient is Jehovah's Witness    Type II diabetes mellitus (HCC)     Current Outpatient Medications:    acetaminophen (TYLENOL) 500 MG tablet, Take 500-1,000 mg by mouth every 6 (six) hours as needed (for pain or headaches)., Disp: , Rfl:    allopurinol (ZYLOPRIM) 100 MG tablet, Take 100 mg by mouth daily., Disp: , Rfl:    apixaban (ELIQUIS) 5 MG TABS tablet, Take 1 tablet (5 mg total) by  mouth 2 (two) times daily., Disp: 60 tablet, Rfl: 11   atorvastatin (LIPITOR) 80 MG tablet, Take 1 tablet (80 mg total) by mouth at bedtime., Disp: 100 tablet, Rfl: 3   B Complex-Biotin-FA (B-COMPLEX PO), Take 1 tablet by mouth daily., Disp: , Rfl:    Biotin 10 MG TABS, Take 10 mg by mouth daily., Disp: , Rfl:    Blood Glucose Monitoring Suppl (ONETOUCH VERIO) w/Device KIT, Use to check blood sugar, Disp: 1 kit, Rfl: 0   carvedilol (COREG) 3.125 MG tablet, Take 1 tablet (3.125 mg total) by mouth 2 (two) times daily with a meal., Disp: 180 tablet, Rfl: 3   Cinnamon 500 MG capsule, Take 500 mg by mouth daily., Disp: , Rfl:    clotrimazole-betamethasone (LOTRISONE) lotion, Apply 1 application topically 2 (two) times daily as needed (itching)., Disp: , Rfl:    Continuous Glucose Sensor (DEXCOM G6 SENSOR) MISC, 1 Device by Does not apply route as directed., Disp: 9 each, Rfl: 3   Continuous Glucose Transmitter (DEXCOM G6 TRANSMITTER) MISC, Change every 90 days, Disp: 1 each, Rfl: 3   empagliflozin (JARDIANCE) 10 MG TABS tablet, Take 1 tablet (10 mg total) by mouth daily., Disp: 30 tablet, Rfl: 11   ferrous sulfate 325 (65 FE) MG tablet,  Take 325 mg by mouth 3 (three) times a week., Disp: , Rfl:    folic acid (FOLVITE) 1 MG tablet, Take 1 tablet by mouth once daily, Disp: 90 tablet, Rfl: 3   gabapentin (NEURONTIN) 300 MG capsule, Take 300 mg by mouth 2 (two) times daily., Disp: , Rfl:    glucose blood (ONETOUCH VERIO) test strip, Check blood sugar 2 times daily, Disp: 100 each, Rfl: 12   insulin aspart (NOVOLOG FLEXPEN) 100 UNIT/ML FlexPen, DIAL AND INJECT UNDER THE SKIN THREE TIMES DAILY PER CORRECTION SCALE. MAX DAILY DOSE 50 UNITS, Disp: 15 mL, Rfl: 0   Insulin Glargine (BASAGLAR KWIKPEN) 100 UNIT/ML, Inject 40 Units into the skin at bedtime., Disp: 45 mL, Rfl: 2   insulin lispro (HUMALOG KWIKPEN) 100 UNIT/ML KwikPen, Take HUmalog 8 units with each meal PLUS Humalog correctional insulin scale max daily  dose 50 units, Disp: 30 mL, Rfl: 3   Insulin Lispro (HUMALOG [place]), Inject 8 Units into the skin in the morning, at noon, and at bedtime., Disp: , Rfl:    Insulin Pen Needle 32G X 4 MM MISC, 1 Device by Does not apply route in the morning, at noon, in the evening, and at bedtime., Disp: 400 each, Rfl: 3   Lancets (ONETOUCH DELICA PLUS LANCET33G) MISC, USE   TO CHECK GLUCOSE 4 TIMES DAILY, Disp: 100 each, Rfl: 3   latanoprost (XALATAN) 0.005 % ophthalmic solution, Place 1 drop into both eyes at bedtime., Disp: , Rfl:    levothyroxine (SYNTHROID, LEVOTHROID) 75 MCG tablet, Take 75 mcg by mouth daily before breakfast., Disp: , Rfl:    LUMIGAN 0.01 % SOLN, Place 1 drop into both eyes at bedtime., Disp: , Rfl:    methylPREDNISolone (MEDROL DOSEPAK) 4 MG TBPK tablet, Take as directed (Patient not taking: Reported on 06/13/2023), Disp: 21 tablet, Rfl: 0   Multiple Vitamins-Minerals (ALIVE ONCE DAILY WOMENS 50+ PO), Take 1 tablet by mouth daily., Disp: , Rfl:    ondansetron (ZOFRAN) 4 MG tablet, Take 1 tablet (4 mg total) by mouth every 8 (eight) hours as needed for nausea or vomiting. (Patient not taking: Reported on 06/13/2023), Disp: 20 tablet, Rfl: 0   spironolactone (ALDACTONE) 25 MG tablet, Take 1 tablet (25 mg total) by mouth daily., Disp: 90 tablet, Rfl: 3   torsemide (DEMADEX) 20 MG tablet, Take 2 tablets (40 mg total) by mouth daily., Disp: 60 tablet, Rfl: 11   Travoprost, BAK Free, (TRAVATAN) 0.004 % SOLN ophthalmic solution, Place 1 drop into both eyes at bedtime., Disp: , Rfl:   Social History   Tobacco Use  Smoking Status Former   Current packs/day: 0.00   Average packs/day: 1 pack/day for 22.0 years (22.0 ttl pk-yrs)   Types: Cigarettes   Start date: 12   Quit date: 65   Years since quitting: 42.0  Smokeless Tobacco Never    Allergies  Allergen Reactions   Codeine Other (See Comments)   Other Other (See Comments)    Blood Product Refusal (Jehovah's witness)     Tape Other  (See Comments)    Leaves blisters and marks on the skin   Sulfa Antibiotics Rash   Uloric [Febuxostat] Rash   Objective:  Vascular: DP/PT pulses 2/4 bilateral. CFT <3 seconds. Absent hair growth on digits. Edema noted to bilateral lower extremities. Xerosis noted bilaterally.  Skin. No lacerations or abrasions bilateral feet. Nails 1-5 bilateral  are thickened discolored and elongated with subungual debris. Left second digit amputation . Left hallux with  hyperkeratosis noted and upon debridedment ulceration noted to plantar hallux with granular base. No erythema edema or purulence noted. No probe to bone Musculoskeletal: MMT 5/5 bilateral lower extremities in DF, PF, Inversion and Eversion. Deceased ROM in DF of ankle joint. Left second digit amputation.  No tenderness erythema or edema noted to the great toe currently.  Neurological: Sensation intact to light touch. Protective sensation diminished bilateral.   Assessment:   No diagnosis found.      Plan:  Patient was evaluated and treated and all questions answered. Ulcer left hallux limited to skin breakdown.  -Debridement as below. -Dressed with betadine, DSD. -Off-loading with surgical shoe and offloading padding.  -Start working on DM shoes. Will get fitting -No abx indicated.  -Discussed glucose control and proper protein-rich diet.  -Discussed if any worsening redness, pain, fever or chills to call or may need to report to the emergency room. Patient expressed understanding.   -Discussed treatement options for gouty arthritis and gout education provided. -Has medrol pack for future flares.  -Discussed diet and modifications.  -Advised patient to call if symptoms are not improved within 1 week   Procedure: Excisional Debridement of Wound Rationale: Removal of non-viable soft tissue from the wound to promote healing.  Anesthesia: none Pre-Debridement Wound Measurements: Overlying callus  Post-Debridement Wound  Measurements: 0.2 cm x 0.1 cm x 0.1 cm  Type of Debridement: Sharp Excisional Tissue Removed: Non-viable soft tissue Depth of Debridement: subcutaneous tissue. Technique: Sharp excisional debridement to bleeding, viable wound base.  Dressing: Dry, sterile, compression dressing. Disposition: Patient tolerated procedure well. Patient to return in 2 week for follow-up.  No follow-ups on file.   No follow-ups on file.

## 2023-06-19 ENCOUNTER — Telehealth (HOSPITAL_COMMUNITY): Payer: Self-pay

## 2023-06-19 ENCOUNTER — Other Ambulatory Visit (HOSPITAL_COMMUNITY): Payer: Self-pay

## 2023-06-19 NOTE — Telephone Encounter (Signed)
Called to confirm weekly paramedicine visit with Danielle [name]- she confirmed 0900 visit. Call complete.   [name], EMT-Paramedic [phone] 06/19/2023

## 2023-06-20 ENCOUNTER — Other Ambulatory Visit (HOSPITAL_COMMUNITY): Payer: Self-pay

## 2023-06-20 NOTE — Progress Notes (Signed)
Paramedicine Encounter    Patient ID: Danielle [name], female    DOB: August 14, 1945, 78 y.o.   MRN: [id number]   Complaints- chronic arthritis pain  Assessment- CaOX4, warm and dry ambulating with no shortness of breath or dizziness. No lower leg swelling, she is actively wearing her compression socks. Lungs clear. Vitals obtained.   Compliance with meds- Skipped weekly ferrous sulfate- reports it gives her constipation.   Pill box filled-  one week   Refills needed- ferrous sulfate, folic acid   Meds changes since last visit- Called [facility] about dexcom and they report zero copay for sensors and reader. She plans to pick up yesterday.    MED CONCERNS: -Spoke to [place]- patient advocate plans to call [facility] to "back out of eliquis claim" and to get a 3 month supply of jardiance to help lower the cost of her eliquis. I will follow up.    Social changes- none    VISIT SUMMARY**  Arrived for home visit for Danielle [name] who reports to be feeling well with no complaints. She denied any recent symptoms or complaints. Vitals and assessment as noted. No lower leg swelling. Lungs clear. Weight down 4 lbs from last week. Meds reviewed and confirmed. Pill box filled for one week. Refills called in to [facility]. I reviewed upcoming appointments and confirmed one week visit in the home.   There were no vitals taken for this visit. Weight yesterday-- didn't weigh  Last visit weight-- 197lbs     ACTION: Home visit completed     Patient Care Team: Renford Dills, MD as PCP - General (Internal Medicine) Regan Lemming, MD as PCP - Electrophysiology (Cardiology) Quintella Reichert, MD as PCP - Sleep Medicine (Cardiology) Bensimhon, Bevelyn Buckles, MD as PCP - Cardiology (Cardiology) Burna Sis, LCSW as Social Worker (Licensed Clinical Social Worker) Shamleffer, Konrad Dolores, MD as Consulting Physician (Endocrinology)  Patient Active Problem List   Diagnosis Date Noted   Chronic  systolic heart failure (HCC) 05/08/2022   Type 2 diabetes mellitus with stage 3a chronic kidney disease, with long-term current use of insulin (HCC) 02/16/2022   Prosthetic joint implant failure, initial encounter (HCC) 12/20/2021   Pseudophakia of both eyes 11/15/2021   DVT (deep venous thrombosis) (HCC) 07/31/2021   Acute blood loss anemia    Septic Arthritis, Infection of prosthetic right knee joint (HCC), MSSA infection 07/10/2021   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) 07/10/2021   CAD (coronary artery disease) 06/08/2021   Carotid artery disease (HCC) 06/08/2021   Symptomatic bradycardia 06/08/2021   Paroxysmal atrial fibrillation (HCC) 06/08/2021   CKD (chronic kidney disease), stage III (HCC) 06/08/2021   Polyneuropathy associated with underlying disease (HCC) 05/04/2020   Severe nonproliferative diabetic retinopathy of left eye, with macular edema, associated with type 2 diabetes mellitus (HCC) 11/05/2019   Severe nonproliferative diabetic retinopathy of right eye, with macular edema, associated with type 2 diabetes mellitus (HCC) 11/05/2019   Retinal hemorrhage of right eye 11/05/2019   Retinal hemorrhage of left eye 11/05/2019   Diabetes mellitus (HCC) 05/14/2019   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, with long-term current use of insulin (HCC) 05/14/2019   Type 2 diabetes mellitus with diabetic polyneuropathy, with long-term current use of insulin (HCC) 05/14/2019   History of gout 05/20/2018   Primary localized osteoarthritis of right knee 05/13/2018   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) 12/20/2017   Obesity (BMI 30-39.9) 11/11/2017   OSA (obstructive sleep apnea) 02/18/2017  Essential hypertension    L Breast Cancer    Hypothyroidism     Current Outpatient Medications:    acetaminophen (TYLENOL) 500 MG tablet, Take 500-1,000 mg by mouth every 6 (six) hours as needed (for pain or headaches)., Disp: , Rfl:    allopurinol  (ZYLOPRIM) 100 MG tablet, Take 100 mg by mouth daily., Disp: , Rfl:    apixaban (ELIQUIS) 5 MG TABS tablet, Take 1 tablet (5 mg total) by mouth 2 (two) times daily., Disp: 60 tablet, Rfl: 11   atorvastatin (LIPITOR) 80 MG tablet, Take 1 tablet (80 mg total) by mouth at bedtime., Disp: 100 tablet, Rfl: 3   B Complex-Biotin-FA (B-COMPLEX PO), Take 1 tablet by mouth daily., Disp: , Rfl:    Biotin 10 MG TABS, Take 10 mg by mouth daily., Disp: , Rfl:    Blood Glucose Monitoring Suppl (ONETOUCH VERIO) w/Device KIT, Use to check blood sugar, Disp: 1 kit, Rfl: 0   carvedilol (COREG) 3.125 MG tablet, Take 1 tablet (3.125 mg total) by mouth 2 (two) times daily with a meal., Disp: 180 tablet, Rfl: 3   Cinnamon 500 MG capsule, Take 500 mg by mouth daily., Disp: , Rfl:    clotrimazole-betamethasone (LOTRISONE) lotion, Apply 1 application topically 2 (two) times daily as needed (itching)., Disp: , Rfl:    empagliflozin (JARDIANCE) 10 MG TABS tablet, Take 1 tablet (10 mg total) by mouth daily., Disp: 30 tablet, Rfl: 11   ferrous sulfate 325 (65 FE) MG tablet, Take 325 mg by mouth 3 (three) times a week., Disp: , Rfl:    folic acid (FOLVITE) 1 MG tablet, Take 1 tablet by mouth once daily, Disp: 90 tablet, Rfl: 3   gabapentin (NEURONTIN) 300 MG capsule, Take 300 mg by mouth 2 (two) times daily., Disp: , Rfl:    glucose blood (ONETOUCH VERIO) test strip, Check blood sugar 2 times daily, Disp: 100 each, Rfl: 12   Insulin Glargine (BASAGLAR KWIKPEN) 100 UNIT/ML, Inject 40 Units into the skin at bedtime., Disp: 45 mL, Rfl: 2   Insulin Lispro (HUMALOG [place]), Inject 8 Units into the skin in the morning, at noon, and at bedtime., Disp: , Rfl:    Insulin Pen Needle 32G X 4 MM MISC, 1 Device by Does not apply route in the morning, at noon, in the evening, and at bedtime., Disp: 400 each, Rfl: 3   Lancets (ONETOUCH DELICA PLUS LANCET33G) MISC, USE   TO CHECK GLUCOSE 4 TIMES DAILY, Disp: 100 each, Rfl: 3   latanoprost (XALATAN)  0.005 % ophthalmic solution, Place 1 drop into both eyes at bedtime., Disp: , Rfl:    levothyroxine (SYNTHROID, LEVOTHROID) 75 MCG tablet, Take 75 mcg by mouth daily before breakfast., Disp: , Rfl:    LUMIGAN 0.01 % SOLN, Place 1 drop into both eyes at bedtime., Disp: , Rfl:    Multiple Vitamins-Minerals (ALIVE ONCE DAILY WOMENS 50+ PO), Take 1 tablet by mouth daily., Disp: , Rfl:    spironolactone (ALDACTONE) 25 MG tablet, Take 1 tablet (25 mg total) by mouth daily., Disp: 90 tablet, Rfl: 3   torsemide (DEMADEX) 20 MG tablet, Take 2 tablets (40 mg total) by mouth daily., Disp: 60 tablet, Rfl: 11   Travoprost, BAK Free, (TRAVATAN) 0.004 % SOLN ophthalmic solution, Place 1 drop into both eyes at bedtime., Disp: , Rfl:    Continuous Glucose Sensor (DEXCOM G6 SENSOR) MISC, 1 Device by Does not apply route as directed., Disp: 9 each, Rfl: 3   Continuous  Glucose Transmitter (DEXCOM G6 TRANSMITTER) MISC, Change every 90 days, Disp: 1 each, Rfl: 3   insulin aspart (NOVOLOG FLEXPEN) 100 UNIT/ML FlexPen, DIAL AND INJECT UNDER THE SKIN THREE TIMES DAILY PER CORRECTION SCALE. MAX DAILY DOSE 50 UNITS (Patient not taking: Reported on 06/20/2023), Disp: 15 mL, Rfl: 0   insulin lispro (HUMALOG KWIKPEN) 100 UNIT/ML KwikPen, Take HUmalog 8 units with each meal PLUS Humalog correctional insulin scale max daily dose 50 units, Disp: 30 mL, Rfl: 3   methylPREDNISolone (MEDROL DOSEPAK) 4 MG TBPK tablet, Take as directed (Patient not taking: Reported on 06/20/2023), Disp: 21 tablet, Rfl: 0   ondansetron (ZOFRAN) 4 MG tablet, Take 1 tablet (4 mg total) by mouth every 8 (eight) hours as needed for nausea or vomiting. (Patient not taking: Reported on 06/20/2023), Disp: 20 tablet, Rfl: 0 Allergies  Allergen Reactions   Codeine Other (See Comments)   Other Other (See Comments)    Blood Product Refusal (Jehovah's witness)     Tape Other (See Comments)    Leaves blisters and marks on the skin   Sulfa Antibiotics Rash   Uloric  [Febuxostat] Rash     Social History   Socioeconomic History   Marital status: Widowed    Spouse name: Not on file   Number of children: 3   Years of education: Not on file   Highest education level: Some college, no degree  Occupational History   Occupation: retired  Tobacco Use   Smoking status: Former    Current packs/day: 0.00    Average packs/day: 1 pack/day for 22.0 years (22.0 ttl pk-yrs)    Types: Cigarettes    Start date: 37    Quit date: 65    Years since quitting: 42.0   Smokeless tobacco: Never  Vaping Use   Vaping status: Never Used  Substance and Sexual Activity   Alcohol use: Yes    Comment: 05/22/2018 "glass of wine couple times/wk"   Drug use: Not Currently   Sexual activity: Not Currently    Birth control/protection: None, Post-menopausal  Other Topics Concern   Not on file  Social History Narrative   Not on file   Social Drivers of Health   Financial Resource Strain: Medium Risk (12/22/2021)   Overall Financial Resource Strain (CARDIA)    Difficulty of Paying Living Expenses: Somewhat hard  Food Insecurity: No Food Insecurity (05/08/2018)   Hunger Vital Sign    Worried About Running Out of Food in the Last Year: Never true    Ran Out of Food in the Last Year: Never true  Transportation Needs: Unmet Transportation Needs (05/08/2022)   PRAPARE - Administrator, Civil Service (Medical): Yes    Lack of Transportation (Non-Medical): Yes  Physical Activity: Not on file  Stress: Not on file  Social Connections: Not on file  Intimate Partner Violence: Not on file    Physical Exam      Future Appointments  Date Time Provider Department Center  07/10/2023  3:00 PM Louann Sjogren, DPM [facility]  07/10/2023  3:30 PM [facility]  07/17/2023 10:00 AM [facility] VASC 6 [facility]  07/17/2023 10:30 AM [facility]  09/12/2023  7:15 AM [facility]ICE REMOTES [facility]   12/12/2023  7:15 AM [facility] DEVICE REMOTES [facility]  03/12/2024  7:15 AM [facility] DEVICE REMOTES [facility]  06/11/2024  7:15 AM [facility] DEVICE REMOTES [facility]  09/10/2024  7:15 AM [facility] DEVICE REMOTES [facility]  12/10/2024  7:15 AM [facility] DEVICE REMOTES [facility]  03/11/2025  7:15 AM [facility]ICE REMOTES [facility]  06/10/2025  7:15 AM [facility] DEVICE REMOTES [facility]  09/09/2025  7:15 AM [facility] DEVICE REMOTES [facility]

## 2023-06-24 ENCOUNTER — Other Ambulatory Visit (HOSPITAL_COMMUNITY): Payer: Self-pay

## 2023-06-25 ENCOUNTER — Ambulatory Visit: Payer: Medicare HMO | Admitting: Internal Medicine

## 2023-06-26 ENCOUNTER — Other Ambulatory Visit: Payer: Self-pay | Admitting: Internal Medicine

## 2023-06-26 DIAGNOSIS — Z1231 Encounter for screening mammogram for malignant neoplasm of breast: Secondary | ICD-10-CM

## 2023-06-27 ENCOUNTER — Other Ambulatory Visit (HOSPITAL_COMMUNITY): Payer: Self-pay

## 2023-06-27 NOTE — Progress Notes (Signed)
Paramedicine Encounter    Patient ID: Danielle [name], female    DOB: 1946/04/06, 78 y.o.   MRN: [id number]   Complaints- swelling, weight gain, did not sleep last night   Assessment- CAOx4, warm and dry, sitting on the couch, lower leg swelling, lungs clear.  -ATE CHINESE FOOD YESTERDAY AND HAD FRIED SALMON CAKES-   Compliance with meds- NO MISSED DOSES   Pill box filled- FOR ONE WEEK   Refills needed-   Meds changes since last visit- NONE     Social changes- NONE   CBG- 216    VISIT SUMMARY- Arrived for home visit for [facility] who reports she did not sleep last night. She denied any shortness of breath, chest pain, dizziness. She admitted to eating chinese food yesterday, ramen noodles and fried salmon cakes. She had a 4lb weight gain from yesterday to today. I advised her that the things she is eating are not advised due to her heart failure. She advised she knows but she was tired. I spent some time educating her on the low sodium options and to be sure she is following those guidelines. She agreed. I obtained vitals and reviewed meds and filled pill box. She plans to take extra torsemide today to help with fluid retention. Dr. Gala Romney has told her she can do so if she gains 3lbs or more overnight. I reviewed upcoming appointments and confirmed same. Home visit complete.     There were no vitals taken for this visit. Weight yesterday-- 198LBS Last visit weight-- 193LBS WEIGHT TODAY- 201LBS      ACTION: Home visit completed     Patient Care Team: Renford Dills, MD as PCP - General (Internal Medicine) Regan Lemming, MD as PCP - Electrophysiology (Cardiology) Quintella Reichert, MD as PCP - Sleep Medicine (Cardiology) Bensimhon, Bevelyn Buckles, MD as PCP - Cardiology (Cardiology) Burna Sis, LCSW as Social Worker (Licensed Clinical Social Worker) Shamleffer, Konrad Dolores, MD as Consulting Physician (Endocrinology)  Patient Active Problem List   Diagnosis Date  Noted   Chronic systolic heart failure (HCC) 05/08/2022   Type 2 diabetes mellitus with stage 3a chronic kidney disease, with long-term current use of insulin (HCC) 02/16/2022   Prosthetic joint implant failure, initial encounter (HCC) 12/20/2021   Pseudophakia of both eyes 11/15/2021   DVT (deep venous thrombosis) (HCC) 07/31/2021   Acute blood loss anemia    Septic Arthritis, Infection of prosthetic right knee joint (HCC), MSSA infection 07/10/2021   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) 07/10/2021   CAD (coronary artery disease) 06/08/2021   Carotid artery disease (HCC) 06/08/2021   Symptomatic bradycardia 06/08/2021   Paroxysmal atrial fibrillation (HCC) 06/08/2021   CKD (chronic kidney disease), stage III (HCC) 06/08/2021   Polyneuropathy associated with underlying disease (HCC) 05/04/2020   Severe nonproliferative diabetic retinopathy of left eye, with macular edema, associated with type 2 diabetes mellitus (HCC) 11/05/2019   Severe nonproliferative diabetic retinopathy of right eye, with macular edema, associated with type 2 diabetes mellitus (HCC) 11/05/2019   Retinal hemorrhage of right eye 11/05/2019   Retinal hemorrhage of left eye 11/05/2019   Diabetes mellitus (HCC) 05/14/2019   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, with long-term current use of insulin (HCC) 05/14/2019   Type 2 diabetes mellitus with diabetic polyneuropathy, with long-term current use of insulin (HCC) 05/14/2019   History of gout 05/20/2018   Primary localized osteoarthritis of right knee 05/13/2018   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) 12/20/2017  Obesity (BMI 30-39.9) 11/11/2017   OSA (obstructive sleep apnea) 02/18/2017   Essential hypertension    L Breast Cancer    Hypothyroidism     Current Outpatient Medications:    acetaminophen (TYLENOL) 500 MG tablet, Take 500-1,000 mg by mouth every 6 (six) hours as needed (for pain or headaches)., Disp: , Rfl:     allopurinol (ZYLOPRIM) 100 MG tablet, Take 100 mg by mouth daily., Disp: , Rfl:    apixaban (ELIQUIS) 5 MG TABS tablet, Take 1 tablet (5 mg total) by mouth 2 (two) times daily., Disp: 60 tablet, Rfl: 11   atorvastatin (LIPITOR) 80 MG tablet, Take 1 tablet (80 mg total) by mouth at bedtime., Disp: 100 tablet, Rfl: 3   B Complex-Biotin-FA (B-COMPLEX PO), Take 1 tablet by mouth daily., Disp: , Rfl:    Biotin 10 MG TABS, Take 10 mg by mouth daily., Disp: , Rfl:    Blood Glucose Monitoring Suppl (ONETOUCH VERIO) w/Device KIT, Use to check blood sugar, Disp: 1 kit, Rfl: 0   carvedilol (COREG) 3.125 MG tablet, Take 1 tablet (3.125 mg total) by mouth 2 (two) times daily with a meal., Disp: 180 tablet, Rfl: 3   Cinnamon 500 MG capsule, Take 500 mg by mouth daily., Disp: , Rfl:    clotrimazole-betamethasone (LOTRISONE) lotion, Apply 1 application topically 2 (two) times daily as needed (itching)., Disp: , Rfl:    Continuous Glucose Sensor (DEXCOM G6 SENSOR) MISC, 1 Device by Does not apply route as directed., Disp: 9 each, Rfl: 3   Continuous Glucose Transmitter (DEXCOM G6 TRANSMITTER) MISC, Change every 90 days, Disp: 1 each, Rfl: 3   empagliflozin (JARDIANCE) 10 MG TABS tablet, Take 1 tablet (10 mg total) by mouth daily., Disp: 30 tablet, Rfl: 11   ferrous sulfate 325 (65 FE) MG tablet, Take 325 mg by mouth 3 (three) times a week., Disp: , Rfl:    folic acid (FOLVITE) 1 MG tablet, Take 1 tablet by mouth once daily, Disp: 90 tablet, Rfl: 3   gabapentin (NEURONTIN) 300 MG capsule, Take 300 mg by mouth 2 (two) times daily., Disp: , Rfl:    glucose blood (ONETOUCH VERIO) test strip, Check blood sugar 2 times daily, Disp: 100 each, Rfl: 12   insulin aspart (NOVOLOG FLEXPEN) 100 UNIT/ML FlexPen, DIAL AND INJECT UNDER THE SKIN THREE TIMES DAILY PER CORRECTION SCALE. MAX DAILY DOSE 50 UNITS (Patient not taking: Reported on 06/20/2023), Disp: 15 mL, Rfl: 0   Insulin Glargine (BASAGLAR KWIKPEN) 100 UNIT/ML, Inject 40  Units into the skin at bedtime., Disp: 45 mL, Rfl: 2   insulin lispro (HUMALOG KWIKPEN) 100 UNIT/ML KwikPen, Take HUmalog 8 units with each meal PLUS Humalog correctional insulin scale max daily dose 50 units, Disp: 30 mL, Rfl: 3   Insulin Lispro (HUMALOG [place]), Inject 8 Units into the skin in the morning, at noon, and at bedtime., Disp: , Rfl:    Insulin Pen Needle 32G X 4 MM MISC, 1 Device by Does not apply route in the morning, at noon, in the evening, and at bedtime., Disp: 400 each, Rfl: 3   Lancets (ONETOUCH DELICA PLUS LANCET33G) MISC, USE   TO CHECK GLUCOSE 4 TIMES DAILY, Disp: 100 each, Rfl: 3   latanoprost (XALATAN) 0.005 % ophthalmic solution, Place 1 drop into both eyes at bedtime., Disp: , Rfl:    levothyroxine (SYNTHROID, LEVOTHROID) 75 MCG tablet, Take 75 mcg by mouth daily before breakfast., Disp: , Rfl:    LUMIGAN 0.01 % SOLN, Place  1 drop into both eyes at bedtime., Disp: , Rfl:    methylPREDNISolone (MEDROL DOSEPAK) 4 MG TBPK tablet, Take as directed (Patient not taking: Reported on 06/20/2023), Disp: 21 tablet, Rfl: 0   Multiple Vitamins-Minerals (ALIVE ONCE DAILY WOMENS 50+ PO), Take 1 tablet by mouth daily., Disp: , Rfl:    ondansetron (ZOFRAN) 4 MG tablet, Take 1 tablet (4 mg total) by mouth every 8 (eight) hours as needed for nausea or vomiting. (Patient not taking: Reported on 06/20/2023), Disp: 20 tablet, Rfl: 0   spironolactone (ALDACTONE) 25 MG tablet, Take 1 tablet (25 mg total) by mouth daily., Disp: 90 tablet, Rfl: 3   torsemide (DEMADEX) 20 MG tablet, Take 2 tablets (40 mg total) by mouth daily., Disp: 60 tablet, Rfl: 11   Travoprost, BAK Free, (TRAVATAN) 0.004 % SOLN ophthalmic solution, Place 1 drop into both eyes at bedtime., Disp: , Rfl:  Allergies  Allergen Reactions   Codeine Other (See Comments)   Other Other (See Comments)    Blood Product Refusal (Jehovah's witness)     Tape Other (See Comments)    Leaves blisters and marks on the skin   Sulfa Antibiotics  Rash   Uloric [Febuxostat] Rash     Social History   Socioeconomic History   Marital status: Widowed    Spouse name: Not on file   Number of children: 3   Years of education: Not on file   Highest education level: Some college, no degree  Occupational History   Occupation: retired  Tobacco Use   Smoking status: Former    Current packs/day: 0.00    Average packs/day: 1 pack/day for 22.0 years (22.0 ttl pk-yrs)    Types: Cigarettes    Start date: 33    Quit date: 88    Years since quitting: 42.1   Smokeless tobacco: Never  Vaping Use   Vaping status: Never Used  Substance and Sexual Activity   Alcohol use: Yes    Comment: 05/22/2018 "glass of wine couple times/wk"   Drug use: Not Currently   Sexual activity: Not Currently    Birth control/protection: None, Post-menopausal  Other Topics Concern   Not on file  Social History Narrative   Not on file   Social Drivers of Health   Financial Resource Strain: Medium Risk (12/22/2021)   Overall Financial Resource Strain (CARDIA)    Difficulty of Paying Living Expenses: Somewhat hard  Food Insecurity: No Food Insecurity (05/08/2018)   Hunger Vital Sign    Worried About Running Out of Food in the Last Year: Never true    Ran Out of Food in the Last Year: Never true  Transportation Needs: Unmet Transportation Needs (05/08/2022)   PRAPARE - Administrator, Civil Service (Medical): Yes    Lack of Transportation (Non-Medical): Yes  Physical Activity: Not on file  Stress: Not on file  Social Connections: Not on file  Intimate Partner Violence: Not on file    Physical Exam      Future Appointments  Date Time Provider Department Center  07/10/2023  3:00 PM Louann Sjogren, DPM [facility]  07/10/2023  3:30 PM [facility]  07/17/2023 10:00 AM [facility] VASC 6 [facility] VVS  07/17/2023 10:30 AM [facility]  07/19/2023  9:50 AM [facility] MM 3 [facility]   09/12/2023  7:15 AM [facility] DEVICE REMOTES [facility]  12/12/2023  7:15 AM [facility] DEVICE REMOTES [facility]  03/12/2024  7:15 AM [facility] DEVICE  REMOTES [facility]  06/11/2024  7:15 AM [facility] DEVICE REMOTES [facility]  09/10/2024  7:15 AM [facility] DEVICE REMOTES [facility]  12/10/2024  7:15 AM [facility] DEVICE REMOTES [facility]  03/11/2025  7:15 AM [facility] DEVICE REMOTES [facility]  06/10/2025  7:15 AM [facility] DEVICE REMOTES [facility]  09/09/2025  7:15 AM [facility] DEVICE REMOTES [facility]

## 2023-07-04 ENCOUNTER — Other Ambulatory Visit (HOSPITAL_COMMUNITY): Payer: Self-pay

## 2023-07-04 ENCOUNTER — Other Ambulatory Visit (HOSPITAL_COMMUNITY): Payer: Self-pay | Admitting: Internal Medicine

## 2023-07-04 ENCOUNTER — Telehealth (HOSPITAL_COMMUNITY): Payer: Self-pay

## 2023-07-04 NOTE — Progress Notes (Signed)
 Paramedicine Encounter    Patient ID: Danielle [name], female    DOB: March 08, 1946, 78 y.o.   MRN: [id number]   Complaints- dizziness X1 week- worse when moving.   Assessment- CAOx4 warm and dry, dizziness with difficulty ambulating- using her walker.   Compliance with meds- missed yesterdays morning and evening meds  Pill box filled- one week   Refills needed-  - torsemide   -ferrous sulfate   -atorvastatin    Meds changes since last visit- none     Social changes- sister in social worker and brother living with [name]- helping with ADLS as she is struggling with these due to forgetfulness and trouble ambulating/standing for a long time.    VISIT SUMMARY- Arrived for home visit with [name] who was ambulating to the door with her walker. She was not short of breath but reports having some dizziness for about a week. She reports she believe it is her vertigo that she has had before. She also complains her ears are itching which seems to happen along with her dizziness. I advised her to follow up with her PCP.  She plans to see Dr. Rexanne on Wednesday next week.   I obtained vitals as noted. Assessment as noted. Lungs clear and lower leg edema is at baseline. I reviewed medications and filled pill box for one week. Upcoming appointments confirmed and home visit complete. I will see [name] in one week.   BP 122/70   Pulse 74   Resp 16   Wt 198 lb (89.8 kg)   SpO2 97%   BMI 33.99 kg/m  Weight yesterday-- didn't weigh  Last visit weight- 198lbs      ACTION: Home visit completed     Patient Care Team: Rexanne Ingle, MD as PCP - General (Internal Medicine) Inocencio Soyla Lunger, MD as PCP - Electrophysiology (Cardiology) Shlomo Wilbert SAUNDERS, MD as PCP - Sleep Medicine (Cardiology) Bensimhon, Toribio SAUNDERS, MD as PCP - Cardiology (Cardiology) Cathern Andriette DEL, LCSW as Social Worker (Licensed Clinical Social Worker) Shamleffer, Donell Cardinal, MD as Consulting Physician (Endocrinology)  Patient  Active Problem List   Diagnosis Date Noted   Chronic systolic heart failure (HCC) 05/08/2022   Type 2 diabetes mellitus with stage 3a chronic kidney disease, with long-term current use of insulin  (HCC) 02/16/2022   Prosthetic joint implant failure, initial encounter (HCC) 12/20/2021   Pseudophakia of both eyes 11/15/2021   DVT (deep venous thrombosis) (HCC) 07/31/2021   Acute blood loss anemia    Septic Arthritis, Infection of prosthetic right knee joint (HCC), MSSA infection 07/10/2021   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) 07/10/2021   CAD (coronary artery disease) 06/08/2021   Carotid artery disease (HCC) 06/08/2021   Symptomatic bradycardia 06/08/2021   Paroxysmal atrial fibrillation (HCC) 06/08/2021   CKD (chronic kidney disease), stage III (HCC) 06/08/2021   Polyneuropathy associated with underlying disease (HCC) 05/04/2020   Severe nonproliferative diabetic retinopathy of left eye, with macular edema, associated with type 2 diabetes mellitus (HCC) 11/05/2019   Severe nonproliferative diabetic retinopathy of right eye, with macular edema, associated with type 2 diabetes mellitus (HCC) 11/05/2019   Retinal hemorrhage of right eye 11/05/2019   Retinal hemorrhage of left eye 11/05/2019   Diabetes mellitus (HCC) 05/14/2019   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, with long-term current use of insulin  (HCC) 05/14/2019   Type 2 diabetes mellitus with diabetic polyneuropathy, with long-term current use of insulin  (HCC) 05/14/2019   History of gout 05/20/2018   Primary localized osteoarthritis of right knee  05/13/2018   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) 12/20/2017   Obesity (BMI 30-39.9) 11/11/2017   OSA (obstructive sleep apnea) 02/18/2017   Essential hypertension    L Breast Cancer    Hypothyroidism     Current Outpatient Medications:    acetaminophen  (TYLENOL ) 500 MG tablet, Take 500-1,000 mg by mouth every 6 (six) hours as needed (for  pain or headaches)., Disp: , Rfl:    allopurinol  (ZYLOPRIM ) 100 MG tablet, Take 100 mg by mouth daily., Disp: , Rfl:    apixaban  (ELIQUIS ) 5 MG TABS tablet, Take 1 tablet (5 mg total) by mouth 2 (two) times daily., Disp: 60 tablet, Rfl: 11   atorvastatin  (LIPITOR ) 80 MG tablet, Take 1 tablet (80 mg total) by mouth at bedtime., Disp: 100 tablet, Rfl: 3   B Complex-Biotin -FA (B-COMPLEX PO), Take 1 tablet by mouth daily., Disp: , Rfl:    Biotin  10 MG TABS, Take 10 mg by mouth daily., Disp: , Rfl:    Blood Glucose Monitoring Suppl (ONETOUCH VERIO) w/Device KIT, Use to check blood sugar, Disp: 1 kit, Rfl: 0   carvedilol  (COREG ) 3.125 MG tablet, Take 1 tablet (3.125 mg total) by mouth 2 (two) times daily with a meal., Disp: 180 tablet, Rfl: 3   clotrimazole-betamethasone (LOTRISONE) lotion, Apply 1 application topically 2 (two) times daily as needed (itching)., Disp: , Rfl:    Continuous Glucose Sensor (DEXCOM G6 SENSOR) MISC, 1 Device by Does not apply route as directed., Disp: 9 each, Rfl: 3   Continuous Glucose Transmitter (DEXCOM G6 TRANSMITTER) MISC, Change every 90 days, Disp: 1 each, Rfl: 3   empagliflozin  (JARDIANCE ) 10 MG TABS tablet, Take 1 tablet (10 mg total) by mouth daily., Disp: 30 tablet, Rfl: 11   ferrous sulfate  325 (65 FE) MG tablet, Take 325 mg by mouth 3 (three) times a week., Disp: , Rfl:    folic acid  (FOLVITE ) 1 MG tablet, Take 1 tablet by mouth once daily, Disp: 90 tablet, Rfl: 3   gabapentin  (NEURONTIN ) 300 MG capsule, Take 300 mg by mouth 2 (two) times daily., Disp: , Rfl:    glucose blood (ONETOUCH VERIO) test strip, Check blood sugar 2 times daily, Disp: 100 each, Rfl: 12   Insulin  Glargine (BASAGLAR  KWIKPEN) 100 UNIT/ML, Inject 40 Units into the skin at bedtime., Disp: 45 mL, Rfl: 2   Insulin  Lispro (HUMALOG  Ceiba), Inject 8 Units into the skin in the morning, at noon, and at bedtime., Disp: , Rfl:    Insulin  Pen Needle 32G X 4 MM MISC, 1 Device by Does not apply route in the  morning, at noon, in the evening, and at bedtime., Disp: 400 each, Rfl: 3   Lancets (ONETOUCH DELICA PLUS LANCET33G) MISC, USE   TO CHECK GLUCOSE 4 TIMES DAILY, Disp: 100 each, Rfl: 3   latanoprost  (XALATAN ) 0.005 % ophthalmic solution, Place 1 drop into both eyes at bedtime., Disp: , Rfl:    levothyroxine  (SYNTHROID , LEVOTHROID) 75 MCG tablet, Take 75 mcg by mouth daily before breakfast., Disp: , Rfl:    LUMIGAN 0.01 % SOLN, Place 1 drop into both eyes at bedtime., Disp: , Rfl:    Multiple Vitamins-Minerals (ALIVE ONCE DAILY WOMENS 50+ PO), Take 1 tablet by mouth daily., Disp: , Rfl:    spironolactone  (ALDACTONE ) 25 MG tablet, Take 1 tablet (25 mg total) by mouth daily., Disp: 90 tablet, Rfl: 3   torsemide  (DEMADEX ) 20 MG tablet, Take 2 tablets (40 mg total) by mouth daily., Disp: 60 tablet,  Rfl: 11   Travoprost, BAK Free, (TRAVATAN) 0.004 % SOLN ophthalmic solution, Place 1 drop into both eyes at bedtime., Disp: , Rfl:    Cinnamon 500 MG capsule, Take 500 mg by mouth daily. (Patient not taking: Reported on 06/27/2023), Disp: , Rfl:    insulin  aspart (NOVOLOG  FLEXPEN) 100 UNIT/ML FlexPen, DIAL  AND INJECT UNDER THE SKIN THREE TIMES DAILY PER CORRECTION SCALE. MAX DAILY DOSE 50 UNITS (Patient not taking: Reported on 06/20/2023), Disp: 15 mL, Rfl: 0   insulin  lispro (HUMALOG  KWIKPEN) 100 UNIT/ML KwikPen, Take HUmalog  8 units with each meal PLUS Humalog  correctional insulin  scale max daily dose 50 units (Patient not taking: Reported on 07/04/2023), Disp: 30 mL, Rfl: 3   methylPREDNISolone  (MEDROL  DOSEPAK) 4 MG TBPK tablet, Take as directed (Patient not taking: Reported on 07/04/2023), Disp: 21 tablet, Rfl: 0   ondansetron  (ZOFRAN ) 4 MG tablet, Take 1 tablet (4 mg total) by mouth every 8 (eight) hours as needed for nausea or vomiting. (Patient not taking: Reported on 07/04/2023), Disp: 20 tablet, Rfl: 0 Allergies  Allergen Reactions   Codeine Other (See Comments)   Other Other (See Comments)    Blood Product  Refusal (Jehovah's witness)     Tape Other (See Comments)    Leaves blisters and marks on the skin   Sulfa Antibiotics Rash   Uloric  [Febuxostat ] Rash     Social History   Socioeconomic History   Marital status: Widowed    Spouse name: Not on file   Number of children: 3   Years of education: Not on file   Highest education level: Some college, no degree  Occupational History   Occupation: retired  Tobacco Use   Smoking status: Former    Current packs/day: 0.00    Average packs/day: 1 pack/day for 22.0 years (22.0 ttl pk-yrs)    Types: Cigarettes    Start date: 43    Quit date: 48    Years since quitting: 42.1   Smokeless tobacco: Never  Vaping Use   Vaping status: Never Used  Substance and Sexual Activity   Alcohol use: Yes    Comment: 05/22/2018 glass of wine couple times/wk   Drug use: Not Currently   Sexual activity: Not Currently    Birth control/protection: None, Post-menopausal  Other Topics Concern   Not on file  Social History Narrative   Not on file   Social Drivers of Health   Financial Resource Strain: Medium Risk (12/22/2021)   Overall Financial Resource Strain (CARDIA)    Difficulty of Paying Living Expenses: Somewhat hard  Food Insecurity: No Food Insecurity (05/08/2018)   Hunger Vital Sign    Worried About Running Out of Food in the Last Year: Never true    Ran Out of Food in the Last Year: Never true  Transportation Needs: Unmet Transportation Needs (05/08/2022)   PRAPARE - Administrator, Civil Service (Medical): Yes    Lack of Transportation (Non-Medical): Yes  Physical Activity: Not on file  Stress: Not on file  Social Connections: Not on file  Intimate Partner Violence: Not on file    Physical Exam      Future Appointments  Date Time Provider Department Center  07/10/2023  3:00 PM Joya Stabs, DPM [facility]  07/10/2023  3:30 PM [facility]  07/17/2023 10:00 AM [facility]  6 [facility]  07/17/2023 10:30 AM [facility]  07/19/2023  9:50 AM [facility] MM 3 [facility]  09/12/2023  7:15 AM [facility] DEVICE REMOTES [facility]  12/12/2023  7:15 AM [facility] DEVICE REMOTES [facility]  03/12/2024  7:15 AM [facility] DEVICE REMOTES [facility]  06/11/2024  7:15 AM [facility] DEVICE REMOTES [facility]  09/10/2024  7:15 AM [facility] DEVICE REMOTES [facility]  12/10/2024  7:15 AM [facility] DEVICE REMOTES [facility]  03/11/2025  7:15 AM [facility] DEVICE REMOTES [facility]  06/10/2025  7:15 AM [facility] DEVICE REMOTES [facility]  09/09/2025  7:15 AM [facility] DEVICE REMOTES [facility]

## 2023-07-04 NOTE — Telephone Encounter (Signed)
 Refills called into [facility]: Torsemide   Ferrous Sulfate   Atorvastatin   Roberts Ching, EMT-Paramedic [phone] 07/04/2023

## 2023-07-05 DIAGNOSIS — E113291 Type 2 diabetes mellitus with mild nonproliferative diabetic retinopathy without macular edema, right eye: Secondary | ICD-10-CM | POA: Diagnosis not present

## 2023-07-05 DIAGNOSIS — Z794 Long term (current) use of insulin: Secondary | ICD-10-CM | POA: Diagnosis not present

## 2023-07-05 DIAGNOSIS — Z961 Presence of intraocular lens: Secondary | ICD-10-CM | POA: Diagnosis not present

## 2023-07-05 DIAGNOSIS — E113212 Type 2 diabetes mellitus with mild nonproliferative diabetic retinopathy with macular edema, left eye: Secondary | ICD-10-CM | POA: Diagnosis not present

## 2023-07-05 DIAGNOSIS — H401133 Primary open-angle glaucoma, bilateral, severe stage: Secondary | ICD-10-CM | POA: Diagnosis not present

## 2023-07-08 ENCOUNTER — Ambulatory Visit: Payer: Medicare HMO | Admitting: Cardiology

## 2023-07-10 ENCOUNTER — Encounter: Payer: Self-pay | Admitting: Podiatry

## 2023-07-10 ENCOUNTER — Ambulatory Visit: Payer: Medicare HMO

## 2023-07-10 ENCOUNTER — Ambulatory Visit (INDEPENDENT_AMBULATORY_CARE_PROVIDER_SITE_OTHER): Payer: Medicare HMO | Admitting: Podiatry

## 2023-07-10 DIAGNOSIS — L84 Corns and callosities: Secondary | ICD-10-CM

## 2023-07-10 DIAGNOSIS — Z87898 Personal history of other specified conditions: Secondary | ICD-10-CM

## 2023-07-10 DIAGNOSIS — E1151 Type 2 diabetes mellitus with diabetic peripheral angiopathy without gangrene: Secondary | ICD-10-CM

## 2023-07-10 DIAGNOSIS — L97521 Non-pressure chronic ulcer of other part of left foot limited to breakdown of skin: Secondary | ICD-10-CM | POA: Diagnosis not present

## 2023-07-10 DIAGNOSIS — M2141 Flat foot [pes planus] (acquired), right foot: Secondary | ICD-10-CM

## 2023-07-10 NOTE — Progress Notes (Signed)
Patient presents to the office today for diabetic shoe and insole measuring.  Patient was measured with brannock device to determine size and width for 1 pair of extra depth shoes and foam casted for 3 pair of insoles.   Documentation of medical necessity will be sent to patient's treating diabetic doctor to verify and sign.   Patient's diabetic provider: Renford Dills   Shoes and insoles will be ordered at that time and patient will be notified for an appointment for fitting when they arrive.   Shoe size (per patient): 11 Shoe choice:  A330W / A600W Shoe size ordered: 11XWD  Ppw abn signed

## 2023-07-10 NOTE — Progress Notes (Signed)
Subjective:  Patient ID: Danielle [name], female    DOB: Feb 20, 1946,  MRN: [id number]  No chief complaint on file.   78 y.o. female returns for follow-up of left great toe wound.  Relates doing well. Marland Kitchen Has been dressing the great toe as instructed.  Has been dressing as instructed   Review of Systems: Negative except as noted in the HPI. Denies N/V/F/Ch.  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HCC) 02/05/2017   Acute gout due to renal impairment involving right wrist 05/20/2018   Acute kidney failure (HCC) 05/15/2018   Arthritis    "all over" (05/22/2018)   Blood dyscrasia    per pt-has small blood cells-appears as if anemic   Breast cancer, left breast (HCC) 1985   CHF (congestive heart failure) (HCC)    Complication of anesthesia    difficult to awaken from per pt   Coronary artery disease    Dyspnea    Essential hypertension    Heart disease    Heart murmur    History of gout    Hypothyroidism    Obesity (BMI 30-39.9) 11/11/2017   OSA (obstructive sleep apnea) 02/18/2017    severe obstructive sleep apnea with an AHI of 75.6/h and mild central sleep apnea with a CAI of 7.7/h.  Oxygen saturations dropped as low as 82%.   He is on CPAP at 9 cm H2O.   OSA on CPAP    Personal history of chemotherapy    Personal history of radiation therapy    Presence of permanent cardiac pacemaker    Primary localized osteoarthritis of right knee 05/13/2018   Refusal of blood transfusions as patient is Jehovah's Witness    Type II diabetes mellitus (HCC)     Current Outpatient Medications:    acetaminophen (TYLENOL) 500 MG tablet, Take 500-1,000 mg by mouth every 6 (six) hours as needed (for pain or headaches)., Disp: , Rfl:    allopurinol (ZYLOPRIM) 100 MG tablet, Take 100 mg by mouth daily., Disp: , Rfl:    apixaban (ELIQUIS) 5 MG TABS tablet, Take 1 tablet (5 mg total) by mouth 2 (two) times daily., Disp: 60 tablet, Rfl: 11    atorvastatin (LIPITOR) 80 MG tablet, Take 1 tablet (80 mg total) by mouth at bedtime., Disp: 100 tablet, Rfl: 3   B Complex-Biotin-FA (B-COMPLEX PO), Take 1 tablet by mouth daily., Disp: , Rfl:    Biotin 10 MG TABS, Take 10 mg by mouth daily., Disp: , Rfl:    Blood Glucose Monitoring Suppl (ONETOUCH VERIO) w/Device KIT, Use to check blood sugar, Disp: 1 kit, Rfl: 0   carvedilol (COREG) 3.125 MG tablet, Take 1 tablet (3.125 mg total) by mouth 2 (two) times daily with a meal., Disp: 180 tablet, Rfl: 3   Cinnamon 500 MG capsule, Take 500 mg by mouth daily. (Patient not taking: Reported on 06/27/2023), Disp: , Rfl:    clotrimazole-betamethasone (LOTRISONE) lotion, Apply 1 application topically 2 (two) times daily as needed (itching)., Disp: , Rfl:    Continuous Glucose Sensor (DEXCOM G6 SENSOR) MISC, 1 Device by Does not apply route as directed., Disp: 9 each, Rfl: 3   Continuous Glucose Transmitter (DEXCOM G6 TRANSMITTER) MISC, Change every 90 days, Disp: 1 each, Rfl: 3   empagliflozin (JARDIANCE) 10 MG TABS tablet, Take 1 tablet (10 mg total) by mouth daily., Disp: 30 tablet, Rfl: 11   ferrous sulfate 325 (65 FE) MG tablet, Take 325 mg by  mouth 3 (three) times a week., Disp: , Rfl:    folic acid (FOLVITE) 1 MG tablet, Take 1 tablet by mouth once daily, Disp: 90 tablet, Rfl: 3   gabapentin (NEURONTIN) 300 MG capsule, Take 300 mg by mouth 2 (two) times daily., Disp: , Rfl:    glucose blood (ONETOUCH VERIO) test strip, Check blood sugar 2 times daily, Disp: 100 each, Rfl: 12   insulin aspart (NOVOLOG FLEXPEN) 100 UNIT/ML FlexPen, DIAL AND INJECT UNDER THE SKIN THREE TIMES DAILY PER CORRECTION SCALE. MAX DAILY DOSE 50 UNITS (Patient not taking: Reported on 06/20/2023), Disp: 15 mL, Rfl: 0   Insulin Glargine (BASAGLAR KWIKPEN) 100 UNIT/ML, Inject 40 Units into the skin at bedtime., Disp: 45 mL, Rfl: 2   insulin lispro (HUMALOG KWIKPEN) 100 UNIT/ML KwikPen, Take HUmalog 8 units with each meal PLUS Humalog  correctional insulin scale max daily dose 50 units (Patient not taking: Reported on 07/04/2023), Disp: 30 mL, Rfl: 3   Insulin Lispro (HUMALOG [place]), Inject 8 Units into the skin in the morning, at noon, and at bedtime., Disp: , Rfl:    Insulin Pen Needle 32G X 4 MM MISC, 1 Device by Does not apply route in the morning, at noon, in the evening, and at bedtime., Disp: 400 each, Rfl: 3   Lancets (ONETOUCH DELICA PLUS LANCET33G) MISC, USE   TO CHECK GLUCOSE 4 TIMES DAILY, Disp: 100 each, Rfl: 3   latanoprost (XALATAN) 0.005 % ophthalmic solution, Place 1 drop into both eyes at bedtime., Disp: , Rfl:    levothyroxine (SYNTHROID, LEVOTHROID) 75 MCG tablet, Take 75 mcg by mouth daily before breakfast., Disp: , Rfl:    LUMIGAN 0.01 % SOLN, Place 1 drop into both eyes at bedtime., Disp: , Rfl:    methylPREDNISolone (MEDROL DOSEPAK) 4 MG TBPK tablet, Take as directed (Patient not taking: Reported on 07/04/2023), Disp: 21 tablet, Rfl: 0   Multiple Vitamins-Minerals (ALIVE ONCE DAILY WOMENS 50+ PO), Take 1 tablet by mouth daily., Disp: , Rfl:    ondansetron (ZOFRAN) 4 MG tablet, Take 1 tablet (4 mg total) by mouth every 8 (eight) hours as needed for nausea or vomiting. (Patient not taking: Reported on 07/04/2023), Disp: 20 tablet, Rfl: 0   spironolactone (ALDACTONE) 25 MG tablet, Take 1 tablet (25 mg total) by mouth daily., Disp: 90 tablet, Rfl: 3   torsemide (DEMADEX) 20 MG tablet, Take 2 tablets (40 mg total) by mouth daily., Disp: 60 tablet, Rfl: 11   Travoprost, BAK Free, (TRAVATAN) 0.004 % SOLN ophthalmic solution, Place 1 drop into both eyes at bedtime., Disp: , Rfl:   Social History   Tobacco Use  Smoking Status Former   Current packs/day: 0.00   Average packs/day: 1 pack/day for 22.0 years (22.0 ttl pk-yrs)   Types: Cigarettes   Start date: 65   Quit date: 53   Years since quitting: 42.1  Smokeless Tobacco Never    Allergies  Allergen Reactions   Codeine Other (See Comments)   Other Other (See  Comments)    Blood Product Refusal (Jehovah's witness)     Tape Other (See Comments)    Leaves blisters and marks on the skin   Sulfa Antibiotics Rash   Uloric [Febuxostat] Rash   Objective:  Vascular: DP/PT pulses 2/4 bilateral. CFT <3 seconds. Absent hair growth on digits. Edema noted to bilateral lower extremities. Xerosis noted bilaterally.  Skin. No lacerations or abrasions bilateral feet. Nails 1-5 bilateral  are thickened discolored and elongated with subungual debris.  Left second digit amputation . Left hallux with hyperkeratosis noted and upon debridedment ulceration noted to plantar hallux with granular base. Nearly healed. No erythema edema or purulence noted. No probe to bone Musculoskeletal: MMT 5/5 bilateral lower extremities in DF, PF, Inversion and Eversion. Deceased ROM in DF of ankle joint. Left second digit amputation.  No tenderness erythema or edema noted to the great toe currently.  Neurological: Sensation intact to light touch. Protective sensation diminished bilateral.   Assessment:   1. Skin ulcer of left great toe, limited to breakdown of skin (HCC)   2. Type II diabetes mellitus with peripheral circulatory disorder [facility])         Plan:  Patient was evaluated and treated and all questions answered. Ulcer left hallux limited to skin breakdown.  Nearly healed.  -Debridement as below. -Dressed with betadine, DSD. -Off-loading with surgical shoe and offloading padding.  -Start working on DM shoes.  -No abx indicated.  -Discussed glucose control and proper protein-rich diet.  -Discussed if any worsening redness, pain, fever or chills to call or may need to report to the emergency room. Patient expressed understanding.   -Discussed treatement options for gouty arthritis and gout education provided. -Has medrol pack for future flares.  -Discussed diet and modifications.  -Advised patient to call if symptoms are not improved within 1 week   Procedure:  Excisional Debridement of Wound Rationale: Removal of non-viable soft tissue from the wound to promote healing.  Anesthesia: none Pre-Debridement Wound Measurements: Overlying callus  Post-Debridement Wound Measurements: 0.2 cm x 0.1 cm x 0.1 cm  Type of Debridement: Sharp Excisional Tissue Removed: Non-viable soft tissue Depth of Debridement: subcutaneous tissue. Technique: Sharp excisional debridement to bleeding, viable wound base.  Dressing: Dry, sterile, compression dressing. Disposition: Patient tolerated procedure well. Patient to return in 2 week for follow-up.  No follow-ups on file.   No follow-ups on file.

## 2023-07-11 ENCOUNTER — Other Ambulatory Visit (HOSPITAL_COMMUNITY): Payer: Self-pay

## 2023-07-11 NOTE — Progress Notes (Signed)
Paramedicine Encounter    Patient ID: Danielle [name], female    DOB: June 02, 1945, 78 y.o.   MRN: [id number]   Complaints- glands in throat swollen and hurting, toe pain   Assessment- CAOX4, warm and dry, ambulatory with no shortness of breath, no lower leg swelling, lungs clear, vitals baseline, weight in range. No dizziness, chest pain.   Compliance with meds- no missed doses   Pill box filled- for one week   Refills needed- needs eliquis samples   Meds changes since last visit- none     Social changes- none    VISIT SUMMARY- Arrived for home visit for [name] who reports to be feeling okay but having some throat gland swelling and irritation. She was seen by PCP for same and they are concerned about her thyroid gland so she had labs drawn and will be sent to see an ENT as well. She is also complaining about her toe pain and she was recently seen by [facility] and they are managing same. I obtained vitals and assessment. No lower leg edema, no abdominal distention. Lungs clear. I reviewed meds and filled pill box for one week. She will need eliquis samples waiting for her to reach her 3% OOP at pharmacy. We reviewed upcoming appointments and confirmed same. Home visit complete. [name].   - Will come out Monday to assist with Dexcom application   BP 110/60   Pulse 70   Resp 16   Wt 196 lb (88.9 kg)   SpO2 98%   BMI 33.64 kg/m  Weight yesterday-- 198lbs Last visit weight-- 198lbs     ACTION: Home visit completed     Patient Care Team: Renford Dills, MD as PCP - General (Internal Medicine) Regan Lemming, MD as PCP - Electrophysiology (Cardiology) Quintella Reichert, MD as PCP - Sleep Medicine (Cardiology) Bensimhon, Bevelyn Buckles, MD as PCP - Cardiology (Cardiology) Burna Sis, LCSW as Social Worker (Licensed Clinical Social Worker) Shamleffer, Konrad Dolores, MD as Consulting Physician (Endocrinology)  Patient Active Problem List    Diagnosis Date Noted   Chronic systolic heart failure (HCC) 05/08/2022   Type 2 diabetes mellitus with stage 3a chronic kidney disease, with long-term current use of insulin (HCC) 02/16/2022   Prosthetic joint implant failure, initial encounter (HCC) 12/20/2021   Pseudophakia of both eyes 11/15/2021   DVT (deep venous thrombosis) (HCC) 07/31/2021   Acute blood loss anemia    Septic Arthritis, Infection of prosthetic right knee joint (HCC), MSSA infection 07/10/2021   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) 07/10/2021   CAD (coronary artery disease) 06/08/2021   Carotid artery disease (HCC) 06/08/2021   Symptomatic bradycardia 06/08/2021   Paroxysmal atrial fibrillation (HCC) 06/08/2021   CKD (chronic kidney disease), stage III (HCC) 06/08/2021   Polyneuropathy associated with underlying disease (HCC) 05/04/2020   Severe nonproliferative diabetic retinopathy of left eye, with macular edema, associated with type 2 diabetes mellitus (HCC) 11/05/2019   Severe nonproliferative diabetic retinopathy of right eye, with macular edema, associated with type 2 diabetes mellitus (HCC) 11/05/2019   Retinal hemorrhage of right eye 11/05/2019   Retinal hemorrhage of left eye 11/05/2019   Diabetes mellitus (HCC) 05/14/2019   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, with long-term current use of insulin (HCC) 05/14/2019   Type 2 diabetes mellitus with diabetic polyneuropathy, with long-term current use of insulin (HCC) 05/14/2019   History of gout 05/20/2018   Primary localized osteoarthritis of right  knee 05/13/2018   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) 12/20/2017   Obesity (BMI 30-39.9) 11/11/2017   OSA (obstructive sleep apnea) 02/18/2017   Essential hypertension    L Breast Cancer    Hypothyroidism     Current Outpatient Medications:    acetaminophen (TYLENOL) 500 MG tablet, Take 500-1,000 mg by mouth every 6 (six) hours as needed (for pain or headaches).,  Disp: , Rfl:    allopurinol (ZYLOPRIM) 100 MG tablet, Take 100 mg by mouth daily., Disp: , Rfl:    apixaban (ELIQUIS) 5 MG TABS tablet, Take 1 tablet (5 mg total) by mouth 2 (two) times daily., Disp: 60 tablet, Rfl: 11   atorvastatin (LIPITOR) 80 MG tablet, Take 1 tablet (80 mg total) by mouth at bedtime., Disp: 100 tablet, Rfl: 3   B Complex-Biotin-FA (B-COMPLEX PO), Take 1 tablet by mouth daily., Disp: , Rfl:    Biotin 10 MG TABS, Take 10 mg by mouth daily., Disp: , Rfl:    Blood Glucose Monitoring Suppl (ONETOUCH VERIO) w/Device KIT, Use to check blood sugar, Disp: 1 kit, Rfl: 0   carvedilol (COREG) 3.125 MG tablet, Take 1 tablet (3.125 mg total) by mouth 2 (two) times daily with a meal., Disp: 180 tablet, Rfl: 3   Cinnamon 500 MG capsule, Take 500 mg by mouth daily., Disp: , Rfl:    clotrimazole-betamethasone (LOTRISONE) lotion, Apply 1 application topically 2 (two) times daily as needed (itching)., Disp: , Rfl:    Continuous Glucose Sensor (DEXCOM G6 SENSOR) MISC, 1 Device by Does not apply route as directed., Disp: 9 each, Rfl: 3   Continuous Glucose Transmitter (DEXCOM G6 TRANSMITTER) MISC, Change every 90 days, Disp: 1 each, Rfl: 3   empagliflozin (JARDIANCE) 10 MG TABS tablet, Take 1 tablet (10 mg total) by mouth daily., Disp: 30 tablet, Rfl: 11   ferrous sulfate 325 (65 FE) MG tablet, Take 325 mg by mouth 3 (three) times a week., Disp: , Rfl:    folic acid (FOLVITE) 1 MG tablet, Take 1 tablet by mouth once daily, Disp: 90 tablet, Rfl: 3   gabapentin (NEURONTIN) 300 MG capsule, Take 300 mg by mouth 2 (two) times daily., Disp: , Rfl:    glucose blood (ONETOUCH VERIO) test strip, Check blood sugar 2 times daily, Disp: 100 each, Rfl: 12   insulin aspart (NOVOLOG FLEXPEN) 100 UNIT/ML FlexPen, DIAL AND INJECT UNDER THE SKIN THREE TIMES DAILY PER CORRECTION SCALE. MAX DAILY DOSE 50 UNITS, Disp: 15 mL, Rfl: 0   Insulin Glargine (BASAGLAR KWIKPEN) 100 UNIT/ML, Inject 40 Units into the skin at  bedtime., Disp: 45 mL, Rfl: 2   insulin lispro (HUMALOG KWIKPEN) 100 UNIT/ML KwikPen, Take HUmalog 8 units with each meal PLUS Humalog correctional insulin scale max daily dose 50 units, Disp: 30 mL, Rfl: 3   Insulin Lispro (HUMALOG [place]), Inject 8 Units into the skin in the morning, at noon, and at bedtime., Disp: , Rfl:    Insulin Pen Needle 32G X 4 MM MISC, 1 Device by Does not apply route in the morning, at noon, in the evening, and at bedtime., Disp: 400 each, Rfl: 3   Lancets (ONETOUCH DELICA PLUS LANCET33G) MISC, USE   TO CHECK GLUCOSE 4 TIMES DAILY, Disp: 100 each, Rfl: 3   latanoprost (XALATAN) 0.005 % ophthalmic solution, Place 1 drop into both eyes at bedtime., Disp: , Rfl:    levothyroxine (SYNTHROID, LEVOTHROID) 75 MCG tablet, Take 75 mcg by mouth daily before breakfast., Disp: , Rfl:  LUMIGAN 0.01 % SOLN, Place 1 drop into both eyes at bedtime., Disp: , Rfl:    methylPREDNISolone (MEDROL DOSEPAK) 4 MG TBPK tablet, Take as directed, Disp: 21 tablet, Rfl: 0   Multiple Vitamins-Minerals (ALIVE ONCE DAILY WOMENS 50+ PO), Take 1 tablet by mouth daily., Disp: , Rfl:    ondansetron (ZOFRAN) 4 MG tablet, Take 1 tablet (4 mg total) by mouth every 8 (eight) hours as needed for nausea or vomiting., Disp: 20 tablet, Rfl: 0   spironolactone (ALDACTONE) 25 MG tablet, Take 1 tablet (25 mg total) by mouth daily., Disp: 90 tablet, Rfl: 3   torsemide (DEMADEX) 20 MG tablet, Take 2 tablets (40 mg total) by mouth daily., Disp: 60 tablet, Rfl: 11   Travoprost, BAK Free, (TRAVATAN) 0.004 % SOLN ophthalmic solution, Place 1 drop into both eyes at bedtime., Disp: , Rfl:  Allergies  Allergen Reactions   Codeine Other (See Comments)   Other Other (See Comments)    Blood Product Refusal (Jehovah's witness)     Tape Other (See Comments)    Leaves blisters and marks on the skin   Sulfa Antibiotics Rash   Uloric [Febuxostat] Rash     Social History   Socioeconomic History   Marital status: Widowed     Spouse name: Not on file   Number of children: 3   Years of education: Not on file   Highest education level: Some college, no degree  Occupational History   Occupation: retired  Tobacco Use   Smoking status: Former    Current packs/day: 0.00    Average packs/day: 1 pack/day for 22.0 years (22.0 ttl pk-yrs)    Types: Cigarettes    Start date: 78    Quit date: 81    Years since quitting: 42.1   Smokeless tobacco: Never  Vaping Use   Vaping status: Never Used  Substance and Sexual Activity   Alcohol use: Yes    Comment: 05/22/2018 "glass of wine couple times/wk"   Drug use: Not Currently   Sexual activity: Not Currently    Birth control/protection: None, Post-menopausal  Other Topics Concern   Not on file  Social History Narrative   Not on file   Social Drivers of Health   Financial Resource Strain: Medium Risk (12/22/2021)   Overall Financial Resource Strain (CARDIA)    Difficulty of Paying Living Expenses: Somewhat hard  Food Insecurity: No Food Insecurity (05/08/2018)   Hunger Vital Sign    Worried About Running Out of Food in the Last Year: Never true    Ran Out of Food in the Last Year: Never true  Transportation Needs: Unmet Transportation Needs (05/08/2022)   PRAPARE - Administrator, Civil Service (Medical): Yes    Lack of Transportation (Non-Medical): Yes  Physical Activity: Not on file  Stress: Not on file  Social Connections: Not on file  Intimate Partner Violence: Not on file    Physical Exam      Future Appointments  Date Time Provider Department Center  07/17/2023 10:00 AM [facility] 6 [facility]  07/17/2023 10:30 AM [facility]  07/19/2023  9:50 AM [facility] MM 3 [facility]  07/24/2023  1:15 PM Louann Sjogren, DPM [facility]  09/12/2023  7:15 AM [facility] DEVICE REMOTES [facility]  12/12/2023  7:15 AM [facility] DEVICE REMOTES [facility]  03/12/2024  7:15 AM [facility]  DEVICE REMOTES [facility]  06/11/2024  7:15 AM [facility] DEVICE REMOTES [facility]  09/10/2024  7:15 AM [facility] DEVICE REMOTES [facility]  12/10/2024  7:15 AM [facility] DEVICE REMOTES [facility]  03/11/2025  7:15 AM [facility]ICE REM[facility]ES [facility]  06/10/2025  7:15 AM [facility] DEVICE REMOTES [facility]  09/09/2025  7:15 AM [facility] DEVICE [facility]ES [facility]

## 2023-07-15 ENCOUNTER — Other Ambulatory Visit (HOSPITAL_COMMUNITY): Payer: Self-pay

## 2023-07-15 NOTE — Progress Notes (Signed)
Paramedicine Encounter    Patient ID: Danielle [name], female    DOB: 1945-07-16, 78 y.o.   MRN: [id number]  Assisted Virga with education on how to place her Dexcom.   Also completed a second week pill box with inclement weather pending just in case.   Refills: -allupurinol -levothyroxine -gabapentin -eliquis samples   ACTION: Home visit completed     Patient Care Team: Renford Dills, MD as PCP - General (Internal Medicine) Regan Lemming, MD as PCP - Electrophysiology (Cardiology) Quintella Reichert, MD as PCP - Sleep Medicine (Cardiology) Bensimhon, Bevelyn Buckles, MD as PCP - Cardiology (Cardiology) Burna Sis, LCSW as Social Worker (Licensed Clinical Social Worker) Shamleffer, Konrad Dolores, MD as Consulting Physician (Endocrinology)  Patient Active Problem List   Diagnosis Date Noted   Chronic systolic heart failure (HCC) 05/08/2022   Type 2 diabetes mellitus with stage 3a chronic kidney disease, with long-term current use of insulin (HCC) 02/16/2022   Prosthetic joint implant failure, initial encounter (HCC) 12/20/2021   Pseudophakia of both eyes 11/15/2021   DVT (deep venous thrombosis) (HCC) 07/31/2021   Acute blood loss anemia    Septic Arthritis, Infection of prosthetic right knee joint (HCC), MSSA infection 07/10/2021   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) 07/10/2021   CAD (coronary artery disease) 06/08/2021   Carotid artery disease (HCC) 06/08/2021   Symptomatic bradycardia 06/08/2021   Paroxysmal atrial fibrillation (HCC) 06/08/2021   CKD (chronic kidney disease), stage III (HCC) 06/08/2021   Polyneuropathy associated with underlying disease (HCC) 05/04/2020   Severe nonproliferative diabetic retinopathy of left eye, with macular edema, associated with type 2 diabetes mellitus (HCC) 11/05/2019   Severe nonproliferative diabetic retinopathy of right eye, with macular edema, associated with type 2 diabetes mellitus (HCC) 11/05/2019    Retinal hemorrhage of right eye 11/05/2019   Retinal hemorrhage of left eye 11/05/2019   Diabetes mellitus (HCC) 05/14/2019   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, with long-term current use of insulin (HCC) 05/14/2019   Type 2 diabetes mellitus with diabetic polyneuropathy, with long-term current use of insulin (HCC) 05/14/2019   History of gout 05/20/2018   Primary localized osteoarthritis of right knee 05/13/2018   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) 12/20/2017   Obesity (BMI 30-39.9) 11/11/2017   OSA (obstructive sleep apnea) 02/18/2017   Essential hypertension    L Breast Cancer    Hypothyroidism     Current Outpatient Medications:    acetaminophen (TYLENOL) 500 MG tablet, Take 500-1,000 mg by mouth every 6 (six) hours as needed (for pain or headaches)., Disp: , Rfl:    allopurinol (ZYLOPRIM) 100 MG tablet, Take 100 mg by mouth daily., Disp: , Rfl:    apixaban (ELIQUIS) 5 MG TABS tablet, Take 1 tablet (5 mg total) by mouth 2 (two) times daily., Disp: 60 tablet, Rfl: 11   atorvastatin (LIPITOR) 80 MG tablet, Take 1 tablet (80 mg total) by mouth at bedtime., Disp: 100 tablet, Rfl: 3   B Complex-Biotin-FA (B-COMPLEX PO), Take 1 tablet by mouth daily., Disp: , Rfl:    Biotin 10 MG TABS, Take 10 mg by mouth daily., Disp: , Rfl:    Blood Glucose Monitoring Suppl (ONETOUCH VERIO) w/Device KIT, Use to check blood sugar, Disp: 1 kit, Rfl: 0   carvedilol (COREG) 3.125 MG tablet, Take 1 tablet (3.125 mg total) by mouth 2 (two) times daily with a meal., Disp: 180 tablet, Rfl: 3   Cinnamon 500 MG capsule, Take 500 mg by  mouth daily., Disp: , Rfl:    clotrimazole-betamethasone (LOTRISONE) lotion, Apply 1 application topically 2 (two) times daily as needed (itching)., Disp: , Rfl:    Continuous Glucose Sensor (DEXCOM G6 SENSOR) MISC, 1 Device by Does not apply route as directed., Disp: 9 each, Rfl: 3   Continuous Glucose Transmitter (DEXCOM G6 TRANSMITTER) MISC, Change  every 90 days, Disp: 1 each, Rfl: 3   empagliflozin (JARDIANCE) 10 MG TABS tablet, Take 1 tablet (10 mg total) by mouth daily., Disp: 30 tablet, Rfl: 11   ferrous sulfate 325 (65 FE) MG tablet, Take 325 mg by mouth 3 (three) times a week., Disp: , Rfl:    folic acid (FOLVITE) 1 MG tablet, Take 1 tablet by mouth once daily, Disp: 90 tablet, Rfl: 3   gabapentin (NEURONTIN) 300 MG capsule, Take 300 mg by mouth 2 (two) times daily., Disp: , Rfl:    glucose blood (ONETOUCH VERIO) test strip, Check blood sugar 2 times daily, Disp: 100 each, Rfl: 12   insulin aspart (NOVOLOG FLEXPEN) 100 UNIT/ML FlexPen, DIAL AND INJECT UNDER THE SKIN THREE TIMES DAILY PER CORRECTION SCALE. MAX DAILY DOSE 50 UNITS, Disp: 15 mL, Rfl: 0   Insulin Glargine (BASAGLAR KWIKPEN) 100 UNIT/ML, Inject 40 Units into the skin at bedtime., Disp: 45 mL, Rfl: 2   insulin lispro (HUMALOG KWIKPEN) 100 UNIT/ML KwikPen, Take HUmalog 8 units with each meal PLUS Humalog correctional insulin scale max daily dose 50 units, Disp: 30 mL, Rfl: 3   Insulin Lispro (HUMALOG [place]), Inject 8 Units into the skin in the morning, at noon, and at bedtime., Disp: , Rfl:    Insulin Pen Needle 32G X 4 MM MISC, 1 Device by Does not apply route in the morning, at noon, in the evening, and at bedtime., Disp: 400 each, Rfl: 3   Lancets (ONETOUCH DELICA PLUS LANCET33G) MISC, USE   TO CHECK GLUCOSE 4 TIMES DAILY, Disp: 100 each, Rfl: 3   latanoprost (XALATAN) 0.005 % ophthalmic solution, Place 1 drop into both eyes at bedtime., Disp: , Rfl:    levothyroxine (SYNTHROID, LEVOTHROID) 75 MCG tablet, Take 75 mcg by mouth daily before breakfast., Disp: , Rfl:    LUMIGAN 0.01 % SOLN, Place 1 drop into both eyes at bedtime., Disp: , Rfl:    methylPREDNISolone (MEDROL DOSEPAK) 4 MG TBPK tablet, Take as directed, Disp: 21 tablet, Rfl: 0   Multiple Vitamins-Minerals (ALIVE ONCE DAILY WOMENS 50+ PO), Take 1 tablet by mouth daily., Disp: , Rfl:    ondansetron (ZOFRAN) 4 MG tablet,  Take 1 tablet (4 mg total) by mouth every 8 (eight) hours as needed for nausea or vomiting., Disp: 20 tablet, Rfl: 0   spironolactone (ALDACTONE) 25 MG tablet, Take 1 tablet (25 mg total) by mouth daily., Disp: 90 tablet, Rfl: 3   torsemide (DEMADEX) 20 MG tablet, Take 2 tablets (40 mg total) by mouth daily., Disp: 60 tablet, Rfl: 11   Travoprost, BAK Free, (TRAVATAN) 0.004 % SOLN ophthalmic solution, Place 1 drop into both eyes at bedtime., Disp: , Rfl:  Allergies  Allergen Reactions   Codeine Other (See Comments)   Other Other (See Comments)    Blood Product Refusal (Jehovah's witness)     Tape Other (See Comments)    Leaves blisters and marks on the skin   Sulfa Antibiotics Rash   Uloric [Febuxostat] Rash     Social History   Socioeconomic History   Marital status: Widowed    Spouse name: Not on file  Number of children: 3   Years of education: Not on file   Highest education level: Some college, no degree  Occupational History   Occupation: retired  Tobacco Use   Smoking status: Former    Current packs/day: 0.00    Average packs/day: 1 pack/day for 22.0 years (22.0 ttl pk-yrs)    Types: Cigarettes    Start date: 14    Quit date: 34    Years since quitting: 42.1   Smokeless tobacco: Never  Vaping Use   Vaping status: Never Used  Substance and Sexual Activity   Alcohol use: Yes    Comment: 05/22/2018 "glass of wine couple times/wk"   Drug use: Not Currently   Sexual activity: Not Currently    Birth control/protection: None, Post-menopausal  Other Topics Concern   Not on file  Social History Narrative   Not on file   Social Drivers of Health   Financial Resource Strain: Medium Risk (12/22/2021)   Overall Financial Resource Strain (CARDIA)    Difficulty of Paying Living Expenses: Somewhat hard  Food Insecurity: No Food Insecurity (05/08/2018)   Hunger Vital Sign    Worried About Running Out of Food in the Last Year: Never true    Ran Out of Food in the  Last Year: Never true  Transportation Needs: Unmet Transportation Needs (05/08/2022)   PRAPARE - Administrator, Civil Service (Medical): Yes    Lack of Transportation (Non-Medical): Yes  Physical Activity: Not on file  Stress: Not on file  Social Connections: Not on file  Intimate Partner Violence: Not on file    Physical Exam      Future Appointments  Date Time Provider Department Center  07/17/2023 10:00 AM [facility] 6 [facility]  07/17/2023 10:30 AM [facility]  07/19/2023  9:50 AM [facility] MM 3 [facility]  07/24/2023  1:15 PM Louann Sjogren, DPM [facility]  09/12/2023  7:15 AM [facility] DEVICE REMOTES [facility]  12/12/2023  7:15 AM [facility] DEVICE REMOTES [facility]  03/12/2024  7:15 AM [facility] DEVICE REMOTES [facility]  06/11/2024  7:15 AM [facility] DEVICE REMOTES [facility]  09/10/2024  7:15 AM [facility] DEVICE REMOTES [facility]  12/10/2024  7:15 AM [facility] DEVICE REMOTES [facility]  03/11/2025  7:15 AM [facility] DEVICE REMOTES [facility]  06/10/2025  7:15 AM [facility] DEVICE REMOTES [facility]  09/09/2025  7:15 AM [facility] DEVICE REMOTES [facility]

## 2023-07-17 ENCOUNTER — Telehealth (HOSPITAL_COMMUNITY): Payer: Self-pay

## 2023-07-17 ENCOUNTER — Ambulatory Visit: Payer: Medicare HMO

## 2023-07-17 ENCOUNTER — Ambulatory Visit (HOSPITAL_COMMUNITY): Payer: Medicare HMO | Attending: Vascular Surgery

## 2023-07-17 ENCOUNTER — Other Ambulatory Visit (HOSPITAL_COMMUNITY): Payer: Self-pay

## 2023-07-17 MED ORDER — FERROUS SULFATE 325 (65 FE) MG PO TABS: 325.0000 mg | ORAL_TABLET | ORAL | 3 refills | Status: DC

## 2023-07-17 NOTE — Telephone Encounter (Signed)
Called and spoke to [facility] to refill Mykhia's meds and confirmed her updated copay for Eliquis is now $152.71- Marelyn reports she can pay for this next week. I will follow up.   [name], EMT-Paramedic [phone] 07/17/2023

## 2023-07-17 NOTE — Telephone Encounter (Signed)
[facility] Paramedicine Message to [facility]  Pharmacy (if applicable): [facility] [address]    Issue/reason for call: NEEDS REFILL ON FERROUS SULFATE   Medication refill? YES- FERROUS SULFATE   [name], EMT-Paramedic [phone] 07/17/2023

## 2023-07-19 ENCOUNTER — Ambulatory Visit: Payer: Medicare HMO

## 2023-07-19 ENCOUNTER — Ambulatory Visit
Admission: RE | Admit: 2023-07-19 | Discharge: 2023-07-19 | Disposition: A | Discharge status: Home / Self Care | Payer: Medicare HMO | Source: Ambulatory Visit | Attending: Internal Medicine | Admitting: Internal Medicine

## 2023-07-19 DIAGNOSIS — Z1231 Encounter for screening mammogram for malignant neoplasm of breast: Secondary | ICD-10-CM | POA: Diagnosis not present

## 2023-07-22 NOTE — Progress Notes (Signed)
 Remote pacemaker transmission.

## 2023-07-24 ENCOUNTER — Ambulatory Visit: Payer: Medicare HMO | Admitting: Podiatry

## 2023-07-24 ENCOUNTER — Encounter: Payer: Self-pay | Admitting: Podiatry

## 2023-07-24 DIAGNOSIS — E1151 Type 2 diabetes mellitus with diabetic peripheral angiopathy without gangrene: Secondary | ICD-10-CM | POA: Diagnosis not present

## 2023-07-24 DIAGNOSIS — L97521 Non-pressure chronic ulcer of other part of left foot limited to breakdown of skin: Secondary | ICD-10-CM

## 2023-07-24 NOTE — Progress Notes (Signed)
 Subjective:  Patient ID: Danielle [name], female    DOB: 1945-12-25,  MRN: [id number]  No chief complaint on file.   78 y.o. female returns for follow-up of left great toe wound.  Relates doing well. Marland Kitchen Has been dressing the great toe as instructed.  Has been dressing as instructed   Review of Systems: Negative except as noted in the HPI. Denies N/V/F/Ch.  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HCC) 02/05/2017   Acute gout due to renal impairment involving right wrist 05/20/2018   Acute kidney failure (HCC) 05/15/2018   Arthritis    "all over" (05/22/2018)   Blood dyscrasia    per pt-has small blood cells-appears as if anemic   Breast cancer, left breast (HCC) 1985   CHF (congestive heart failure) (HCC)    Complication of anesthesia    difficult to awaken from per pt   Coronary artery disease    Dyspnea    Essential hypertension    Heart disease    Heart murmur    History of gout    Hypothyroidism    Obesity (BMI 30-39.9) 11/11/2017   OSA (obstructive sleep apnea) 02/18/2017    severe obstructive sleep apnea with an AHI of 75.6/h and mild central sleep apnea with a CAI of 7.7/h.  Oxygen saturations dropped as low as 82%.   He is on CPAP at 9 cm H2O.   OSA on CPAP    Personal history of chemotherapy    Personal history of radiation therapy    Presence of permanent cardiac pacemaker    Primary localized osteoarthritis of right knee 05/13/2018   Refusal of blood transfusions as patient is Jehovah's Witness    Type II diabetes mellitus (HCC)     Current Outpatient Medications:    acetaminophen (TYLENOL) 500 MG tablet, Take 500-1,000 mg by mouth every 6 (six) hours as needed (for pain or headaches)., Disp: , Rfl:    allopurinol (ZYLOPRIM) 100 MG tablet, Take 100 mg by mouth daily., Disp: , Rfl:    apixaban (ELIQUIS) 5 MG TABS tablet, Take 1 tablet (5 mg total) by mouth 2 (two) times daily., Disp: 60 tablet, Rfl: 11    atorvastatin (LIPITOR) 80 MG tablet, Take 1 tablet (80 mg total) by mouth at bedtime., Disp: 100 tablet, Rfl: 3   B Complex-Biotin-FA (B-COMPLEX PO), Take 1 tablet by mouth daily., Disp: , Rfl:    Biotin 10 MG TABS, Take 10 mg by mouth daily., Disp: , Rfl:    Blood Glucose Monitoring Suppl (ONETOUCH VERIO) w/Device KIT, Use to check blood sugar, Disp: 1 kit, Rfl: 0   carvedilol (COREG) 3.125 MG tablet, Take 1 tablet (3.125 mg total) by mouth 2 (two) times daily with a meal., Disp: 180 tablet, Rfl: 3   Cinnamon 500 MG capsule, Take 500 mg by mouth daily., Disp: , Rfl:    clotrimazole-betamethasone (LOTRISONE) lotion, Apply 1 application topically 2 (two) times daily as needed (itching)., Disp: , Rfl:    Continuous Glucose Sensor (DEXCOM G6 SENSOR) MISC, 1 Device by Does not apply route as directed., Disp: 9 each, Rfl: 3   Continuous Glucose Transmitter (DEXCOM G6 TRANSMITTER) MISC, Change every 90 days, Disp: 1 each, Rfl: 3   empagliflozin (JARDIANCE) 10 MG TABS tablet, Take 1 tablet (10 mg total) by mouth daily., Disp: 30 tablet, Rfl: 11   ferrous sulfate 325 (65 FE) MG tablet, Take 1 tablet (325 mg total) by mouth 3 (three)  times a week., Disp: 45 tablet, Rfl: 3   folic acid (FOLVITE) 1 MG tablet, Take 1 tablet by mouth once daily, Disp: 90 tablet, Rfl: 3   gabapentin (NEURONTIN) 300 MG capsule, Take 300 mg by mouth 2 (two) times daily., Disp: , Rfl:    glucose blood (ONETOUCH VERIO) test strip, Check blood sugar 2 times daily, Disp: 100 each, Rfl: 12   insulin aspart (NOVOLOG FLEXPEN) 100 UNIT/ML FlexPen, DIAL AND INJECT UNDER THE SKIN THREE TIMES DAILY PER CORRECTION SCALE. MAX DAILY DOSE 50 UNITS, Disp: 15 mL, Rfl: 0   Insulin Glargine (BASAGLAR KWIKPEN) 100 UNIT/ML, Inject 40 Units into the skin at bedtime., Disp: 45 mL, Rfl: 2   insulin lispro (HUMALOG KWIKPEN) 100 UNIT/ML KwikPen, Take HUmalog 8 units with each meal PLUS Humalog correctional insulin scale max daily dose 50 units, Disp: 30 mL,  Rfl: 3   Insulin Lispro (HUMALOG [place]), Inject 8 Units into the skin in the morning, at noon, and at bedtime., Disp: , Rfl:    Insulin Pen Needle 32G X 4 MM MISC, 1 Device by Does not apply route in the morning, at noon, in the evening, and at bedtime., Disp: 400 each, Rfl: 3   Lancets (ONETOUCH DELICA PLUS LANCET33G) MISC, USE   TO CHECK GLUCOSE 4 TIMES DAILY, Disp: 100 each, Rfl: 3   latanoprost (XALATAN) 0.005 % ophthalmic solution, Place 1 drop into both eyes at bedtime., Disp: , Rfl:    levothyroxine (SYNTHROID, LEVOTHROID) 75 MCG tablet, Take 75 mcg by mouth daily before breakfast., Disp: , Rfl:    LUMIGAN 0.01 % SOLN, Place 1 drop into both eyes at bedtime., Disp: , Rfl:    methylPREDNISolone (MEDROL DOSEPAK) 4 MG TBPK tablet, Take as directed, Disp: 21 tablet, Rfl: 0   Multiple Vitamins-Minerals (ALIVE ONCE DAILY WOMENS 50+ PO), Take 1 tablet by mouth daily., Disp: , Rfl:    ondansetron (ZOFRAN) 4 MG tablet, Take 1 tablet (4 mg total) by mouth every 8 (eight) hours as needed for nausea or vomiting., Disp: 20 tablet, Rfl: 0   spironolactone (ALDACTONE) 25 MG tablet, Take 1 tablet (25 mg total) by mouth daily., Disp: 90 tablet, Rfl: 3   torsemide (DEMADEX) 20 MG tablet, Take 2 tablets (40 mg total) by mouth daily., Disp: 60 tablet, Rfl: 11   Travoprost, BAK Free, (TRAVATAN) 0.004 % SOLN ophthalmic solution, Place 1 drop into both eyes at bedtime., Disp: , Rfl:   Social History   Tobacco Use  Smoking Status Former   Current packs/day: 0.00   Average packs/day: 1 pack/day for 22.0 years (22.0 ttl pk-yrs)   Types: Cigarettes   Start date: 105   Quit date: 38   Years since quitting: 42.1  Smokeless Tobacco Never    Allergies  Allergen Reactions   Codeine Other (See Comments)   Other Other (See Comments)    Blood Product Refusal (Jehovah's witness)     Tape Other (See Comments)    Leaves blisters and marks on the skin   Sulfa Antibiotics Rash   Uloric [Febuxostat] Rash    Objective:  Vascular: DP/PT pulses 2/4 bilateral. CFT <3 seconds. Absent hair growth on digits. Edema noted to bilateral lower extremities. Xerosis noted bilaterally.  Skin. No lacerations or abrasions bilateral feet. Nails 1-5 bilateral  are thickened discolored and elongated with subungual debris. Left second digit amputation . Left hallux with hyperkeratosis noted and upon debridedment ulceration noted to plantar hallux with granular base. Nearly healed. No erythema edema  or purulence noted. No probe to bone Musculoskeletal: MMT 5/5 bilateral lower extremities in DF, PF, Inversion and Eversion. Deceased ROM in DF of ankle joint. Left second digit amputation.  No tenderness erythema or edema noted to the great toe currently.  Neurological: Sensation intact to light touch. Protective sensation diminished bilateral.   Assessment:   1. Skin ulcer of left great toe, limited to breakdown of skin (HCC)   2. Type II diabetes mellitus with peripheral circulatory disorder [facility])         Plan:  Patient was evaluated and treated and all questions answered. Ulcer left hallux limited to skin breakdown.  Nearly healed.  -Debridement as below. -Dressed with betadine, DSD. -Off-loading with surgical shoe and offloading padding. Examined shoe and feel the shoe may not be functioning properly for her so new shoe dispensed as previous is broken and no longer supporting what we need.  -Start working on DM shoes.  -No abx indicated.  -Discussed glucose control and proper protein-rich diet.  -Discussed if any worsening redness, pain, fever or chills to call or may need to report to the emergency room. Patient expressed understanding.   Procedure: Excisional Debridement of Wound Rationale: Removal of non-viable soft tissue from the wound to promote healing.  Anesthesia: none Pre-Debridement Wound Measurements: Overlying callus  Post-Debridement Wound Measurements: 0.3 cm x 031 cm x 0.1 cm  Type of  Debridement: Sharp Excisional Tissue Removed: Non-viable soft tissue Depth of Debridement: subcutaneous tissue. Technique: Sharp excisional debridement to bleeding, viable wound base.  Dressing: Dry, sterile, compression dressing. Disposition: Patient tolerated procedure well. Patient to return in 2 week for follow-up.  No follow-ups on file.   No follow-ups on file.

## 2023-07-25 ENCOUNTER — Other Ambulatory Visit (HOSPITAL_COMMUNITY): Payer: Self-pay

## 2023-07-25 DIAGNOSIS — E1165 Type 2 diabetes mellitus with hyperglycemia: Secondary | ICD-10-CM | POA: Diagnosis not present

## 2023-07-25 DIAGNOSIS — I5022 Chronic systolic (congestive) heart failure: Secondary | ICD-10-CM | POA: Diagnosis not present

## 2023-07-25 DIAGNOSIS — D631 Anemia in chronic kidney disease: Secondary | ICD-10-CM | POA: Diagnosis not present

## 2023-07-25 DIAGNOSIS — E1142 Type 2 diabetes mellitus with diabetic polyneuropathy: Secondary | ICD-10-CM | POA: Diagnosis not present

## 2023-07-25 DIAGNOSIS — E039 Hypothyroidism, unspecified: Secondary | ICD-10-CM | POA: Diagnosis not present

## 2023-07-25 DIAGNOSIS — I2510 Atherosclerotic heart disease of native coronary artery without angina pectoris: Secondary | ICD-10-CM | POA: Diagnosis not present

## 2023-07-25 DIAGNOSIS — N1832 Chronic kidney disease, stage 3b: Secondary | ICD-10-CM | POA: Diagnosis not present

## 2023-07-25 NOTE — Progress Notes (Signed)
 Paramedicine Encounter    Patient ID: Danielle [name], female    DOB: 04-02-1946, 78 y.o.   MRN: [id number]   Complaints- none   Assessment- CAOx4, warm and dry with no complaints, no swelling, no weigh gain.   Compliance with meds- no missed doses   Pill box filled- for one week   Refills needed-   Meds changes since last visit- basaglar decreased 45 to 40 units     Social changes- none    VISIT SUMMARY- Arrived for home visit for [facility] who reports to be feeling well just no getting good sleep. She says she has been waking up several times in the night but not due to pain or shortness of breath. She denied any chest pain, shortness of breath, swelling or dizziness. No lower leg edema.. Vitals and assessment as noted. Meds reviewed and confirmed. Pill box filled for one week. Appointments confirmed and reviewed. Home visit complete.   Dexcom replaced  BP 112/64   Pulse 89   Resp 16   Wt 196 lb (88.9 kg)   SpO2 95%   BMI 33.64 kg/m  Weight yesterday-- 195lbs  Last visit weight-- 195lbs      ACTION: Home visit completed     Patient Care Team: Renford Dills, MD as PCP - General (Internal Medicine) Regan Lemming, MD as PCP - Electrophysiology (Cardiology) Quintella Reichert, MD as PCP - Sleep Medicine (Cardiology) Bensimhon, Bevelyn Buckles, MD as PCP - Cardiology (Cardiology) Burna Sis, LCSW as Social Worker (Licensed Clinical Social Worker) Shamleffer, Konrad Dolores, MD as Consulting Physician (Endocrinology)  Patient Active Problem List   Diagnosis Date Noted   Chronic systolic heart failure (HCC) 05/08/2022   Type 2 diabetes mellitus with stage 3a chronic kidney disease, with long-term current use of insulin (HCC) 02/16/2022   Prosthetic joint implant failure, initial encounter (HCC) 12/20/2021   Pseudophakia of both eyes 11/15/2021   DVT (deep venous thrombosis) (HCC) 07/31/2021   Acute blood loss anemia    Septic Arthritis, Infection of prosthetic  right knee joint (HCC), MSSA infection 07/10/2021   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) 07/10/2021   CAD (coronary artery disease) 06/08/2021   Carotid artery disease (HCC) 06/08/2021   Symptomatic bradycardia 06/08/2021   Paroxysmal atrial fibrillation (HCC) 06/08/2021   CKD (chronic kidney disease), stage III (HCC) 06/08/2021   Polyneuropathy associated with underlying disease (HCC) 05/04/2020   Severe nonproliferative diabetic retinopathy of left eye, with macular edema, associated with type 2 diabetes mellitus (HCC) 11/05/2019   Severe nonproliferative diabetic retinopathy of right eye, with macular edema, associated with type 2 diabetes mellitus (HCC) 11/05/2019   Retinal hemorrhage of right eye 11/05/2019   Retinal hemorrhage of left eye 11/05/2019   Diabetes mellitus (HCC) 05/14/2019   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, with long-term current use of insulin (HCC) 05/14/2019   Type 2 diabetes mellitus with diabetic polyneuropathy, with long-term current use of insulin (HCC) 05/14/2019   History of gout 05/20/2018   Primary localized osteoarthritis of right knee 05/13/2018   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) 12/20/2017   Obesity (BMI 30-39.9) 11/11/2017   OSA (obstructive sleep apnea) 02/18/2017   Essential hypertension    L Breast Cancer    Hypothyroidism     Current Outpatient Medications:    acetaminophen (TYLENOL) 500 MG tablet, Take 500-1,000 mg by mouth every 6 (six) hours as needed (for pain or headaches)., Disp: , Rfl:    allopurinol (ZYLOPRIM) 100 MG  tablet, Take 100 mg by mouth daily., Disp: , Rfl:    apixaban (ELIQUIS) 5 MG TABS tablet, Take 1 tablet (5 mg total) by mouth 2 (two) times daily., Disp: 60 tablet, Rfl: 11   atorvastatin (LIPITOR) 80 MG tablet, Take 1 tablet (80 mg total) by mouth at bedtime., Disp: 100 tablet, Rfl: 3   B Complex-Biotin-FA (B-COMPLEX PO), Take 1 tablet by mouth daily., Disp: , Rfl:    Biotin  10 MG TABS, Take 10 mg by mouth daily., Disp: , Rfl:    Blood Glucose Monitoring Suppl (ONETOUCH VERIO) w/Device KIT, Use to check blood sugar, Disp: 1 kit, Rfl: 0   carvedilol (COREG) 3.125 MG tablet, Take 1 tablet (3.125 mg total) by mouth 2 (two) times daily with a meal., Disp: 180 tablet, Rfl: 3   Cinnamon 500 MG capsule, Take 500 mg by mouth daily., Disp: , Rfl:    clotrimazole-betamethasone (LOTRISONE) lotion, Apply 1 application topically 2 (two) times daily as needed (itching)., Disp: , Rfl:    Continuous Glucose Sensor (DEXCOM G6 SENSOR) MISC, 1 Device by Does not apply route as directed., Disp: 9 each, Rfl: 3   Continuous Glucose Transmitter (DEXCOM G6 TRANSMITTER) MISC, Change every 90 days, Disp: 1 each, Rfl: 3   empagliflozin (JARDIANCE) 10 MG TABS tablet, Take 1 tablet (10 mg total) by mouth daily., Disp: 30 tablet, Rfl: 11   ferrous sulfate 325 (65 FE) MG tablet, Take 1 tablet (325 mg total) by mouth 3 (three) times a week., Disp: 45 tablet, Rfl: 3   folic acid (FOLVITE) 1 MG tablet, Take 1 tablet by mouth once daily, Disp: 90 tablet, Rfl: 3   gabapentin (NEURONTIN) 300 MG capsule, Take 300 mg by mouth 2 (two) times daily., Disp: , Rfl:    glucose blood (ONETOUCH VERIO) test strip, Check blood sugar 2 times daily, Disp: 100 each, Rfl: 12   insulin aspart (NOVOLOG FLEXPEN) 100 UNIT/ML FlexPen, DIAL AND INJECT UNDER THE SKIN THREE TIMES DAILY PER CORRECTION SCALE. MAX DAILY DOSE 50 UNITS, Disp: 15 mL, Rfl: 0   Insulin Glargine (BASAGLAR KWIKPEN) 100 UNIT/ML, Inject 40 Units into the skin at bedtime., Disp: 45 mL, Rfl: 2   insulin lispro (HUMALOG KWIKPEN) 100 UNIT/ML KwikPen, Take HUmalog 8 units with each meal PLUS Humalog correctional insulin scale max daily dose 50 units, Disp: 30 mL, Rfl: 3   Insulin Lispro (HUMALOG [employer]), Inject 8 Units into the skin in the morning, at noon, and at bedtime., Disp: , Rfl:    Insulin Pen Needle 32G X 4 MM MISC, 1 Device by Does not apply route in the  morning, at noon, in the evening, and at bedtime., Disp: 400 each, Rfl: 3   Lancets (ONETOUCH DELICA PLUS LANCET33G) MISC, USE   TO CHECK GLUCOSE 4 TIMES DAILY, Disp: 100 each, Rfl: 3   latanoprost (XALATAN) 0.005 % ophthalmic solution, Place 1 drop into both eyes at bedtime., Disp: , Rfl:    levothyroxine (SYNTHROID, LEVOTHROID) 75 MCG tablet, Take 75 mcg by mouth daily before breakfast., Disp: , Rfl:    LUMIGAN 0.01 % SOLN, Place 1 drop into both eyes at bedtime., Disp: , Rfl:    methylPREDNISolone (MEDROL DOSEPAK) 4 MG TBPK tablet, Take as directed, Disp: 21 tablet, Rfl: 0   Multiple Vitamins-Minerals (ALIVE ONCE DAILY WOMENS 50+ PO), Take 1 tablet by mouth daily., Disp: , Rfl:    ondansetron (ZOFRAN) 4 MG tablet, Take 1 tablet (4 mg total) by mouth every 8 (eight) hours  as needed for nausea or vomiting., Disp: 20 tablet, Rfl: 0   spironolactone (ALDACTONE) 25 MG tablet, Take 1 tablet (25 mg total) by mouth daily., Disp: 90 tablet, Rfl: 3   torsemide (DEMADEX) 20 MG tablet, Take 2 tablets (40 mg total) by mouth daily., Disp: 60 tablet, Rfl: 11   Travoprost, BAK Free, (TRAVATAN) 0.004 % SOLN ophthalmic solution, Place 1 drop into both eyes at bedtime., Disp: , Rfl:  Allergies  Allergen Reactions   Codeine Other (See Comments)   Other Other (See Comments)    Blood Product Refusal (Jehovah's witness)     Tape Other (See Comments)    Leaves blisters and marks on the skin   Sulfa Antibiotics Rash   Uloric [Febuxostat] Rash     Social History   Socioeconomic History   Marital status: Widowed    Spouse name: Not on file   Number of children: 3   Years of education: Not on file   Highest education level: Some college, no degree  Occupational History   Occupation: retired  Tobacco Use   Smoking status: Former    Current packs/day: 0.00    Average packs/day: 1 pack/day for 22.0 years (22.0 ttl pk-yrs)    Types: Cigarettes    Start date: 4    Quit date: 38    Years since  quitting: 42.1   Smokeless tobacco: Never  Vaping Use   Vaping status: Never Used  Substance and Sexual Activity   Alcohol use: Yes    Comment: 05/22/2018 "glass of wine couple times/wk"   Drug use: Not Currently   Sexual activity: Not Currently    Birth control/protection: None, Post-menopausal  Other Topics Concern   Not on file  Social History Narrative   Not on file   Social Drivers of Health   Financial Resource Strain: Medium Risk (12/22/2021)   Overall Financial Resource Strain (CARDIA)    Difficulty of Paying Living Expenses: Somewhat hard  Food Insecurity: No Food Insecurity (05/08/2018)   Hunger Vital Sign    Worried About Running Out of Food in the Last Year: Never true    Ran Out of Food in the Last Year: Never true  Transportation Needs: Unmet Transportation Needs (05/08/2022)   PRAPARE - Administrator, Civil Service (Medical): Yes    Lack of Transportation (Non-Medical): Yes  Physical Activity: Not on file  Stress: Not on file  Social Connections: Not on file  Intimate Partner Violence: Not on file    Physical Exam      Future Appointments  Date Time Provider Department Center  08/07/2023  3:15 PM Louann Sjogren, DPM [facility]  08/23/2023 11:00 AM [facility]  09/12/2023  7:15 AM [facility] DEVICE REMOTES [facility]  09/18/2023 11:00 AM [address] [facility] None  12/12/2023  7:15 AM [facility] DEVICE REMOTES [facility]  03/12/2024  7:15 AM [facility] DEVICE REMOTES [facility]  06/11/2024  7:15 AM [facility] DEVICE REMOTES [facility]  09/10/2024  7:15 AM [facility] DEVICE REMOTES [facility]  12/10/2024  7:15 AM [facility] DEVICE REMOTES [facility]  03/11/2025  7:15 AM [facility] DEVICE REMOTES [facility]  06/10/2025  7:15 AM [facility] DEVICE REMOTES [facility]  09/09/2025  7:15 AM  [facility] DEVICE REMOTES [facility]

## 2023-07-29 DIAGNOSIS — E113491 Type 2 diabetes mellitus with severe nonproliferative diabetic retinopathy without macular edema, right eye: Secondary | ICD-10-CM | POA: Diagnosis not present

## 2023-07-29 DIAGNOSIS — E113412 Type 2 diabetes mellitus with severe nonproliferative diabetic retinopathy with macular edema, left eye: Secondary | ICD-10-CM | POA: Diagnosis not present

## 2023-08-01 ENCOUNTER — Other Ambulatory Visit (HOSPITAL_COMMUNITY): Payer: Self-pay

## 2023-08-01 NOTE — Progress Notes (Signed)
 Paramedicine Encounter    Patient ID: Danielle [name], female    DOB: June 21, 1945, 78 y.o.   MRN: [id number]   Complaints- sinus headache   Assessment- CAOX4, warm and dry ambulating around her apartment, no shortness of breath, some slight lower right leg edema, lungs clear, vitals as noted.   Compliance with meds- no missed doses.   Pill box filled- for one week   Refills needed- torsemide and lantanprost eye drops   Meds changes since last visit- none     Social changes- none    VISIT SUMMARY- Arrived for home visit for [facility] who reports to be feeling okay other than having a sinus headache. She denied any chest pain, shortness of breath or dizziness. I obtained vitals as assessment. Lungs clear. Lower right leg slightly swollen. She admits to eating fried foods over the last few days. We discussed heart healthy compliant diets. She understood. I also called [name] with [facility] to schedule her repeat sleep study for her cpap titration. I left a VM for her to return my call. We reviewed meds and filled pill box for one week. Refills as noted will be called into [facility] on [address]. I plan to see Danielle [name] in one week. Home visit complete.   CBG- 218 (no food or insulin yet this morning)   BP 110/62   Pulse 68   Resp 18   Wt 200 lb (90.7 kg)   SpO2 97%   BMI 34.33 kg/m  Weight yesterday-- didn't weigh  Last visit weight-- 196lbs      ACTION: Home visit completed     Patient Care Team: Renford Dills, MD as PCP - General (Internal Medicine) Regan Lemming, MD as PCP - Electrophysiology (Cardiology) Quintella Reichert, MD as PCP - Sleep Medicine (Cardiology) Bensimhon, Bevelyn Buckles, MD as PCP - Cardiology (Cardiology) Burna Sis, LCSW as Social Worker (Licensed Clinical Social Worker) Shamleffer, Konrad Dolores, MD as Consulting Physician (Endocrinology)  Patient Active Problem List   Diagnosis Date Noted   Chronic systolic heart failure (HCC) 05/08/2022    Type 2 diabetes mellitus with stage 3a chronic kidney disease, with long-term current use of insulin (HCC) 02/16/2022   Prosthetic joint implant failure, initial encounter (HCC) 12/20/2021   Pseudophakia of both eyes 11/15/2021   DVT (deep venous thrombosis) (HCC) 07/31/2021   Acute blood loss anemia    Septic Arthritis, Infection of prosthetic right knee joint (HCC), MSSA infection 07/10/2021   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) 07/10/2021   CAD (coronary artery disease) 06/08/2021   Carotid artery disease (HCC) 06/08/2021   Symptomatic bradycardia 06/08/2021   Paroxysmal atrial fibrillation (HCC) 06/08/2021   CKD (chronic kidney disease), stage III (HCC) 06/08/2021   Polyneuropathy associated with underlying disease (HCC) 05/04/2020   Severe nonproliferative diabetic retinopathy of left eye, with macular edema, associated with type 2 diabetes mellitus (HCC) 11/05/2019   Severe nonproliferative diabetic retinopathy of right eye, with macular edema, associated with type 2 diabetes mellitus (HCC) 11/05/2019   Retinal hemorrhage of right eye 11/05/2019   Retinal hemorrhage of left eye 11/05/2019   Diabetes mellitus (HCC) 05/14/2019   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, with long-term current use of insulin (HCC) 05/14/2019   Type 2 diabetes mellitus with diabetic polyneuropathy, with long-term current use of insulin (HCC) 05/14/2019   History of gout 05/20/2018   Primary localized osteoarthritis of right knee 05/13/2018   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) 12/20/2017   Obesity (  BMI 30-39.9) 11/11/2017   OSA (obstructive sleep apnea) 02/18/2017   Essential hypertension    L Breast Cancer    Hypothyroidism     Current Outpatient Medications:    acetaminophen (TYLENOL) 500 MG tablet, Take 500-1,000 mg by mouth every 6 (six) hours as needed (for pain or headaches)., Disp: , Rfl:    allopurinol (ZYLOPRIM) 100 MG tablet, Take 100 mg by mouth  daily., Disp: , Rfl:    apixaban (ELIQUIS) 5 MG TABS tablet, Take 1 tablet (5 mg total) by mouth 2 (two) times daily., Disp: 60 tablet, Rfl: 11   atorvastatin (LIPITOR) 80 MG tablet, Take 1 tablet (80 mg total) by mouth at bedtime., Disp: 100 tablet, Rfl: 3   B Complex-Biotin-FA (B-COMPLEX PO), Take 1 tablet by mouth daily., Disp: , Rfl:    Biotin 10 MG TABS, Take 10 mg by mouth daily., Disp: , Rfl:    Blood Glucose Monitoring Suppl (ONETOUCH VERIO) w/Device KIT, Use to check blood sugar, Disp: 1 kit, Rfl: 0   carvedilol (COREG) 3.125 MG tablet, Take 1 tablet (3.125 mg total) by mouth 2 (two) times daily with a meal., Disp: 180 tablet, Rfl: 3   Cinnamon 500 MG capsule, Take 500 mg by mouth daily., Disp: , Rfl:    clotrimazole-betamethasone (LOTRISONE) lotion, Apply 1 application topically 2 (two) times daily as needed (itching)., Disp: , Rfl:    Continuous Glucose Sensor (DEXCOM G6 SENSOR) MISC, 1 Device by Does not apply route as directed., Disp: 9 each, Rfl: 3   Continuous Glucose Transmitter (DEXCOM G6 TRANSMITTER) MISC, Change every 90 days, Disp: 1 each, Rfl: 3   empagliflozin (JARDIANCE) 10 MG TABS tablet, Take 1 tablet (10 mg total) by mouth daily., Disp: 30 tablet, Rfl: 11   ferrous sulfate 325 (65 FE) MG tablet, Take 1 tablet (325 mg total) by mouth 3 (three) times a week., Disp: 45 tablet, Rfl: 3   folic acid (FOLVITE) 1 MG tablet, Take 1 tablet by mouth once daily, Disp: 90 tablet, Rfl: 3   gabapentin (NEURONTIN) 300 MG capsule, Take 300 mg by mouth 2 (two) times daily., Disp: , Rfl:    glucose blood (ONETOUCH VERIO) test strip, Check blood sugar 2 times daily, Disp: 100 each, Rfl: 12   insulin aspart (NOVOLOG FLEXPEN) 100 UNIT/ML FlexPen, DIAL AND INJECT UNDER THE SKIN THREE TIMES DAILY PER CORRECTION SCALE. MAX DAILY DOSE 50 UNITS, Disp: 15 mL, Rfl: 0   Insulin Glargine (BASAGLAR KWIKPEN) 100 UNIT/ML, Inject 40 Units into the skin at bedtime., Disp: 45 mL, Rfl: 2   Insulin Lispro  (HUMALOG [place]), Inject 8 Units into the skin in the morning, at noon, and at bedtime., Disp: , Rfl:    Insulin Pen Needle 32G X 4 MM MISC, 1 Device by Does not apply route in the morning, at noon, in the evening, and at bedtime., Disp: 400 each, Rfl: 3   Lancets (ONETOUCH DELICA PLUS LANCET33G) MISC, USE   TO CHECK GLUCOSE 4 TIMES DAILY, Disp: 100 each, Rfl: 3   latanoprost (XALATAN) 0.005 % ophthalmic solution, Place 1 drop into both eyes at bedtime., Disp: , Rfl:    levothyroxine (SYNTHROID, LEVOTHROID) 75 MCG tablet, Take 75 mcg by mouth daily before breakfast., Disp: , Rfl:    Multiple Vitamins-Minerals (ALIVE ONCE DAILY WOMENS 50+ PO), Take 1 tablet by mouth daily., Disp: , Rfl:    Omega-3 Fatty Acids (FISH OIL) 1200 MG CAPS, Take 1,200 mg by mouth at bedtime. Taking 2 capsules at  night, Disp: , Rfl:    ondansetron (ZOFRAN) 4 MG tablet, Take 1 tablet (4 mg total) by mouth every 8 (eight) hours as needed for nausea or vomiting., Disp: 20 tablet, Rfl: 0   spironolactone (ALDACTONE) 25 MG tablet, Take 1 tablet (25 mg total) by mouth daily., Disp: 90 tablet, Rfl: 3   torsemide (DEMADEX) 20 MG tablet, Take 2 tablets (40 mg total) by mouth daily., Disp: 60 tablet, Rfl: 11   insulin lispro (HUMALOG KWIKPEN) 100 UNIT/ML KwikPen, Take HUmalog 8 units with each meal PLUS Humalog correctional insulin scale max daily dose 50 units (Patient not taking: Reported on 08/01/2023), Disp: 30 mL, Rfl: 3   LUMIGAN 0.01 % SOLN, Place 1 drop into both eyes at bedtime. (Patient not taking: Reported on 08/01/2023), Disp: , Rfl:    methylPREDNISolone (MEDROL DOSEPAK) 4 MG TBPK tablet, Take as directed (Patient not taking: Reported on 08/01/2023), Disp: 21 tablet, Rfl: 0   Travoprost, BAK Free, (TRAVATAN) 0.004 % SOLN ophthalmic solution, Place 1 drop into both eyes at bedtime. (Patient not taking: Reported on 08/01/2023), Disp: , Rfl:  Allergies  Allergen Reactions   Codeine Other (See Comments)   Other Other (See Comments)     Blood Product Refusal (Jehovah's witness)     Tape Other (See Comments)    Leaves blisters and marks on the skin   Sulfa Antibiotics Rash   Uloric [Febuxostat] Rash     Social History   Socioeconomic History   Marital status: Widowed    Spouse name: Not on file   Number of children: 3   Years of education: Not on file   Highest education level: Some college, no degree  Occupational History   Occupation: retired  Tobacco Use   Smoking status: Former    Current packs/day: 0.00    Average packs/day: 1 pack/day for 22.0 years (22.0 ttl pk-yrs)    Types: Cigarettes    Start date: 19    Quit date: 69    Years since quitting: 42.2   Smokeless tobacco: Never  Vaping Use   Vaping status: Never Used  Substance and Sexual Activity   Alcohol use: Yes    Comment: 05/22/2018 "glass of wine couple times/wk"   Drug use: Not Currently   Sexual activity: Not Currently    Birth control/protection: None, Post-menopausal  Other Topics Concern   Not on file  Social History Narrative   Not on file   Social Drivers of Health   Financial Resource Strain: Medium Risk (12/22/2021)   Overall Financial Resource Strain (CARDIA)    Difficulty of Paying Living Expenses: Somewhat hard  Food Insecurity: No Food Insecurity (05/08/2018)   Hunger Vital Sign    Worried About Running Out of Food in the Last Year: Never true    Ran Out of Food in the Last Year: Never true  Transportation Needs: Unmet Transportation Needs (05/08/2022)   PRAPARE - Administrator, Civil Service (Medical): Yes    Lack of Transportation (Non-Medical): Yes  Physical Activity: Not on file  Stress: Not on file  Social Connections: Not on file  Intimate Partner Violence: Not on file    Physical Exam      Future Appointments  Date Time Provider Department Center  08/07/2023  3:15 PM Louann Sjogren, DPM [facility]  08/23/2023 11:00 AM [facility]  09/12/2023  7:15 AM  [facility] DEVICE REMOTES [facility]  09/18/2023 11:00 AM [facility] None  12/12/2023  7:15  AM [facility] DEVICE REMOTES [facility]  03/12/2024  7:15 AM [facility] DEVICE REMOTES [facility]  06/11/2024  7:15 AM [facility] DEVICE REMOTES [facility]  09/10/2024  7:15 AM [facility] DEVICE REMOTES [facility]  12/10/2024  7:15 AM [facility] DEVICE REMOTES [facility]  03/11/2025  7:15 AM [facility] DEVICE REMOTES [facility]  06/10/2025  7:15 AM [facility] DEVICE REMOTES [facility]  09/09/2025  7:15 AM [facility] DEVICE REMOTES [facility]

## 2023-08-05 DIAGNOSIS — E113412 Type 2 diabetes mellitus with severe nonproliferative diabetic retinopathy with macular edema, left eye: Secondary | ICD-10-CM | POA: Diagnosis not present

## 2023-08-05 DIAGNOSIS — E113491 Type 2 diabetes mellitus with severe nonproliferative diabetic retinopathy without macular edema, right eye: Secondary | ICD-10-CM | POA: Diagnosis not present

## 2023-08-07 ENCOUNTER — Encounter: Payer: Self-pay | Admitting: Podiatry

## 2023-08-07 ENCOUNTER — Ambulatory Visit (INDEPENDENT_AMBULATORY_CARE_PROVIDER_SITE_OTHER): Payer: Medicare HMO | Admitting: Podiatry

## 2023-08-07 DIAGNOSIS — L97521 Non-pressure chronic ulcer of other part of left foot limited to breakdown of skin: Secondary | ICD-10-CM | POA: Diagnosis not present

## 2023-08-07 DIAGNOSIS — E1151 Type 2 diabetes mellitus with diabetic peripheral angiopathy without gangrene: Secondary | ICD-10-CM

## 2023-08-07 NOTE — Progress Notes (Signed)
 Subjective:  Patient ID: Danielle [name], female    DOB: 25-May-1946,  MRN: [id number]  No chief complaint on file.   78 y.o. female returns for follow-up of left great toe wound.  Relates doing well. Marland Kitchen Has been dressing the great toe as instructed. She did relate hitting the toe and maybe worse.  Has been dressing as instructed   Review of Systems: Negative except as noted in the HPI. Denies N/V/F/Ch.  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HCC) 02/05/2017   Acute gout due to renal impairment involving right wrist 05/20/2018   Acute kidney failure (HCC) 05/15/2018   Arthritis    "all over" (05/22/2018)   Blood dyscrasia    per pt-has small blood cells-appears as if anemic   Breast cancer, left breast (HCC) 1985   CHF (congestive heart failure) (HCC)    Complication of anesthesia    difficult to awaken from per pt   Coronary artery disease    Dyspnea    Essential hypertension    Heart disease    Heart murmur    History of gout    Hypothyroidism    Obesity (BMI 30-39.9) 11/11/2017   OSA (obstructive sleep apnea) 02/18/2017    severe obstructive sleep apnea with an AHI of 75.6/h and mild central sleep apnea with a CAI of 7.7/h.  Oxygen saturations dropped as low as 82%.   He is on CPAP at 9 cm H2O.   OSA on CPAP    Personal history of chemotherapy    Personal history of radiation therapy    Presence of permanent cardiac pacemaker    Primary localized osteoarthritis of right knee 05/13/2018   Refusal of blood transfusions as patient is Jehovah's Witness    Type II diabetes mellitus (HCC)     Current Outpatient Medications:    acetaminophen (TYLENOL) 500 MG tablet, Take 500-1,000 mg by mouth every 6 (six) hours as needed (for pain or headaches)., Disp: , Rfl:    allopurinol (ZYLOPRIM) 100 MG tablet, Take 100 mg by mouth daily., Disp: , Rfl:    apixaban (ELIQUIS) 5 MG TABS tablet, Take 1 tablet (5 mg total) by mouth  2 (two) times daily., Disp: 60 tablet, Rfl: 11   atorvastatin (LIPITOR) 80 MG tablet, Take 1 tablet (80 mg total) by mouth at bedtime., Disp: 100 tablet, Rfl: 3   B Complex-Biotin-FA (B-COMPLEX PO), Take 1 tablet by mouth daily., Disp: , Rfl:    Biotin 10 MG TABS, Take 10 mg by mouth daily., Disp: , Rfl:    Blood Glucose Monitoring Suppl (ONETOUCH VERIO) w/Device KIT, Use to check blood sugar, Disp: 1 kit, Rfl: 0   carvedilol (COREG) 3.125 MG tablet, Take 1 tablet (3.125 mg total) by mouth 2 (two) times daily with a meal., Disp: 180 tablet, Rfl: 3   Cinnamon 500 MG capsule, Take 500 mg by mouth daily., Disp: , Rfl:    clotrimazole-betamethasone (LOTRISONE) lotion, Apply 1 application topically 2 (two) times daily as needed (itching)., Disp: , Rfl:    Continuous Glucose Sensor (DEXCOM G6 SENSOR) MISC, 1 Device by Does not apply route as directed., Disp: 9 each, Rfl: 3   Continuous Glucose Transmitter (DEXCOM G6 TRANSMITTER) MISC, Change every 90 days, Disp: 1 each, Rfl: 3   empagliflozin (JARDIANCE) 10 MG TABS tablet, Take 1 tablet (10 mg total) by mouth daily., Disp: 30 tablet, Rfl: 11   ferrous sulfate 325 (65 FE) MG tablet, Take  1 tablet (325 mg total) by mouth 3 (three) times a week., Disp: 45 tablet, Rfl: 3   folic acid (FOLVITE) 1 MG tablet, Take 1 tablet by mouth once daily, Disp: 90 tablet, Rfl: 3   gabapentin (NEURONTIN) 300 MG capsule, Take 300 mg by mouth 2 (two) times daily., Disp: , Rfl:    glucose blood (ONETOUCH VERIO) test strip, Check blood sugar 2 times daily, Disp: 100 each, Rfl: 12   insulin aspart (NOVOLOG FLEXPEN) 100 UNIT/ML FlexPen, DIAL AND INJECT UNDER THE SKIN THREE TIMES DAILY PER CORRECTION SCALE. MAX DAILY DOSE 50 UNITS, Disp: 15 mL, Rfl: 0   Insulin Glargine (BASAGLAR KWIKPEN) 100 UNIT/ML, Inject 40 Units into the skin at bedtime., Disp: 45 mL, Rfl: 2   insulin lispro (HUMALOG KWIKPEN) 100 UNIT/ML KwikPen, Take HUmalog 8 units with each meal PLUS Humalog correctional  insulin scale max daily dose 50 units (Patient not taking: Reported on 08/01/2023), Disp: 30 mL, Rfl: 3   Insulin Lispro (HUMALOG [place]), Inject 8 Units into the skin in the morning, at noon, and at bedtime., Disp: , Rfl:    Insulin Pen Needle 32G X 4 MM MISC, 1 Device by Does not apply route in the morning, at noon, in the evening, and at bedtime., Disp: 400 each, Rfl: 3   Lancets (ONETOUCH DELICA PLUS LANCET33G) MISC, USE   TO CHECK GLUCOSE 4 TIMES DAILY, Disp: 100 each, Rfl: 3   latanoprost (XALATAN) 0.005 % ophthalmic solution, Place 1 drop into both eyes at bedtime., Disp: , Rfl:    levothyroxine (SYNTHROID, LEVOTHROID) 75 MCG tablet, Take 75 mcg by mouth daily before breakfast., Disp: , Rfl:    LUMIGAN 0.01 % SOLN, Place 1 drop into both eyes at bedtime. (Patient not taking: Reported on 08/01/2023), Disp: , Rfl:    methylPREDNISolone (MEDROL DOSEPAK) 4 MG TBPK tablet, Take as directed (Patient not taking: Reported on 08/01/2023), Disp: 21 tablet, Rfl: 0   Multiple Vitamins-Minerals (ALIVE ONCE DAILY WOMENS 50+ PO), Take 1 tablet by mouth daily., Disp: , Rfl:    Omega-3 Fatty Acids (FISH OIL) 1200 MG CAPS, Take 1,200 mg by mouth at bedtime. Taking 2 capsules at night, Disp: , Rfl:    ondansetron (ZOFRAN) 4 MG tablet, Take 1 tablet (4 mg total) by mouth every 8 (eight) hours as needed for nausea or vomiting., Disp: 20 tablet, Rfl: 0   spironolactone (ALDACTONE) 25 MG tablet, Take 1 tablet (25 mg total) by mouth daily., Disp: 90 tablet, Rfl: 3   torsemide (DEMADEX) 20 MG tablet, Take 2 tablets (40 mg total) by mouth daily., Disp: 60 tablet, Rfl: 11   Travoprost, BAK Free, (TRAVATAN) 0.004 % SOLN ophthalmic solution, Place 1 drop into both eyes at bedtime. (Patient not taking: Reported on 08/01/2023), Disp: , Rfl:   Social History   Tobacco Use  Smoking Status Former   Current packs/day: 0.00   Average packs/day: 1 pack/day for 22.0 years (22.0 ttl pk-yrs)   Types: Cigarettes   Start date: 66   Quit  date: 65   Years since quitting: 42.2  Smokeless Tobacco Never    Allergies  Allergen Reactions   Codeine Other (See Comments)   Other Other (See Comments)    Blood Product Refusal (Jehovah's witness)     Tape Other (See Comments)    Leaves blisters and marks on the skin   Sulfa Antibiotics Rash   Uloric [Febuxostat] Rash   Objective:  Vascular: DP/PT pulses 2/4 bilateral. CFT <3 seconds. Absent  hair growth on digits. Edema noted to bilateral lower extremities. Xerosis noted bilaterally.  Skin. No lacerations or abrasions bilateral feet. Nails 1-5 bilateral  are thickened discolored and elongated with subungual debris. Left second digit amputation . Left hallux with hyperkeratosis noted and upon debridedment ulceration noted to plantar hallux with granular base. Nearly healed. No erythema edema or purulence noted. No probe to bone Musculoskeletal: MMT 5/5 bilateral lower extremities in DF, PF, Inversion and Eversion. Deceased ROM in DF of ankle joint. Left second digit amputation.  No tenderness erythema or edema noted to the great toe currently.  Neurological: Sensation intact to light touch. Protective sensation diminished bilateral.   Assessment:   No diagnosis found.       Plan:  Patient was evaluated and treated and all questions answered. Ulcer left hallux limited to skin breakdown.  Nearly healed.  -Debridement as below. -Dressed with betadine, DSD. -Off-loading with surgical shoe and offloading padding. Peg assist added to shoe to offload -Start working on DM shoes.  -No abx indicated.  -Discussed glucose control and proper protein-rich diet.  -Discussed if any worsening redness, pain, fever or chills to call or may need to report to the emergency room. Patient expressed understanding.   Procedure: Excisional Debridement of Wound Rationale: Removal of non-viable soft tissue from the wound to promote healing.  Anesthesia: none Pre-Debridement Wound Measurements:  Overlying callus  Post-Debridement Wound Measurements: 0.5 cm x 0.3 cm x 0.1 cm  Type of Debridement: Sharp Excisional Tissue Removed: Non-viable soft tissue Depth of Debridement: subcutaneous tissue. Technique: Sharp excisional debridement to bleeding, viable wound base.  Dressing: Dry, sterile, compression dressing. Disposition: Patient tolerated procedure well. Patient to return in 2 week for follow-up.  No follow-ups on file.   No follow-ups on file.

## 2023-08-08 ENCOUNTER — Other Ambulatory Visit (HOSPITAL_COMMUNITY): Payer: Self-pay

## 2023-08-08 NOTE — Progress Notes (Signed)
 Paramedicine Encounter    Patient ID: Larya Charpentier, female    DOB: [birth year], 78 y.o.   MRN: [id number]   Complaints- none   Assessment- CAOX4, warm and dry with no complaints. Vitals obtained at baseline. Lungs clear. No lower leg swelling. Has been wearing compression stockings. Weight down 6lbs.   Compliance with meds- no missed doses   Pill box filled- one week   Refills needed- spironolactone   Meds changes since last visit- none     Social changes- none    VISIT SUMMARY- Arrived for home visit for Keyaira who was reporting to be feeling good with no complaints today. She denied shortness of breath, chest pain or dizziness. Vitals obtained at baseline aside from her glucose- 250 with no food or insulin since last night around 8PM. She has been compliant with her insulin. She plans to reach out to Endocrinology. Meds reviewed and confirmed. Pill box filled for one week. Refills as noted will be called into [facility]. We have not heard from [name] fro the sleep study. I will reach out to [place] about same. Home visit complete. I will see her in one week.   BP 130/84   Pulse 84   Resp 16   Wt 194 lb (88 kg)   SpO2 96%   BMI 33.30 kg/m  Weight yesterday-- didn't weigh  Last visit weight-- 200lbs     ACTION: Home visit completed     Patient Care Team: Renford Dills, MD as PCP - General (Internal Medicine) Regan Lemming, MD as PCP - Electrophysiology (Cardiology) Quintella Reichert, MD as PCP - Sleep Medicine (Cardiology) Bensimhon, Bevelyn Buckles, MD as PCP - Cardiology (Cardiology) Burna Sis, LCSW as Social Worker (Licensed Clinical Social Worker) Shamleffer, Konrad Dolores, MD as Consulting Physician (Endocrinology)  Patient Active Problem List   Diagnosis Date Noted   Chronic systolic heart failure (HCC) 05/08/2022   Type 2 diabetes mellitus with stage 3a chronic kidney disease, with long-term current use of insulin (HCC) 02/16/2022   Prosthetic joint  implant failure, initial encounter (HCC) 12/20/2021   Pseudophakia of both eyes 11/15/2021   DVT (deep venous thrombosis) (HCC) 07/31/2021   Acute blood loss anemia    Septic Arthritis, Infection of prosthetic right knee joint (HCC), MSSA infection 07/10/2021   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) 07/10/2021   CAD (coronary artery disease) 06/08/2021   Carotid artery disease (HCC) 06/08/2021   Symptomatic bradycardia 06/08/2021   Paroxysmal atrial fibrillation (HCC) 06/08/2021   CKD (chronic kidney disease), stage III (HCC) 06/08/2021   Polyneuropathy associated with underlying disease (HCC) 05/04/2020   Severe nonproliferative diabetic retinopathy of left eye, with macular edema, associated with type 2 diabetes mellitus (HCC) 11/05/2019   Severe nonproliferative diabetic retinopathy of right eye, with macular edema, associated with type 2 diabetes mellitus (HCC) 11/05/2019   Retinal hemorrhage of right eye 11/05/2019   Retinal hemorrhage of left eye 11/05/2019   Diabetes mellitus (HCC) 05/14/2019   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, with long-term current use of insulin (HCC) 05/14/2019   Type 2 diabetes mellitus with diabetic polyneuropathy, with long-term current use of insulin (HCC) 05/14/2019   History of gout 05/20/2018   Primary localized osteoarthritis of right knee 05/13/2018   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) 12/20/2017   Obesity (BMI 30-39.9) 11/11/2017   OSA (obstructive sleep apnea) 02/18/2017   Essential hypertension    L Breast Cancer    Hypothyroidism  Current Outpatient Medications:    acetaminophen (TYLENOL) 500 MG tablet, Take 500-1,000 mg by mouth every 6 (six) hours as needed (for pain or headaches)., Disp: , Rfl:    allopurinol (ZYLOPRIM) 100 MG tablet, Take 100 mg by mouth daily., Disp: , Rfl:    apixaban (ELIQUIS) 5 MG TABS tablet, Take 1 tablet (5 mg total) by mouth 2 (two) times daily., Disp: 60 tablet,  Rfl: 11   atorvastatin (LIPITOR) 80 MG tablet, Take 1 tablet (80 mg total) by mouth at bedtime., Disp: 100 tablet, Rfl: 3   B Complex-Biotin-FA (B-COMPLEX PO), Take 1 tablet by mouth daily., Disp: , Rfl:    Biotin 10 MG TABS, Take 10 mg by mouth daily., Disp: , Rfl:    Blood Glucose Monitoring Suppl (ONETOUCH VERIO) w/Device KIT, Use to check blood sugar, Disp: 1 kit, Rfl: 0   carvedilol (COREG) 3.125 MG tablet, Take 1 tablet (3.125 mg total) by mouth 2 (two) times daily with a meal., Disp: 180 tablet, Rfl: 3   Cinnamon 500 MG capsule, Take 500 mg by mouth daily., Disp: , Rfl:    clotrimazole-betamethasone (LOTRISONE) lotion, Apply 1 application topically 2 (two) times daily as needed (itching)., Disp: , Rfl:    Continuous Glucose Sensor (DEXCOM G6 SENSOR) MISC, 1 Device by Does not apply route as directed., Disp: 9 each, Rfl: 3   Continuous Glucose Transmitter (DEXCOM G6 TRANSMITTER) MISC, Change every 90 days, Disp: 1 each, Rfl: 3   empagliflozin (JARDIANCE) 10 MG TABS tablet, Take 1 tablet (10 mg total) by mouth daily., Disp: 30 tablet, Rfl: 11   ferrous sulfate 325 (65 FE) MG tablet, Take 1 tablet (325 mg total) by mouth 3 (three) times a week., Disp: 45 tablet, Rfl: 3   folic acid (FOLVITE) 1 MG tablet, Take 1 tablet by mouth once daily, Disp: 90 tablet, Rfl: 3   gabapentin (NEURONTIN) 300 MG capsule, Take 300 mg by mouth 2 (two) times daily., Disp: , Rfl:    glucose blood (ONETOUCH VERIO) test strip, Check blood sugar 2 times daily, Disp: 100 each, Rfl: 12   Insulin Glargine (BASAGLAR KWIKPEN) 100 UNIT/ML, Inject 40 Units into the skin at bedtime., Disp: 45 mL, Rfl: 2   insulin lispro (HUMALOG KWIKPEN) 100 UNIT/ML KwikPen, Take HUmalog 8 units with each meal PLUS Humalog correctional insulin scale max daily dose 50 units, Disp: 30 mL, Rfl: 3   Insulin Pen Needle 32G X 4 MM MISC, 1 Device by Does not apply route in the morning, at noon, in the evening, and at bedtime., Disp: 400 each, Rfl: 3    Lancets (ONETOUCH DELICA PLUS LANCET33G) MISC, USE   TO CHECK GLUCOSE 4 TIMES DAILY, Disp: 100 each, Rfl: 3   latanoprost (XALATAN) 0.005 % ophthalmic solution, Place 1 drop into both eyes at bedtime., Disp: , Rfl:    levothyroxine (SYNTHROID, LEVOTHROID) 75 MCG tablet, Take 75 mcg by mouth daily before breakfast., Disp: , Rfl:    LUMIGAN 0.01 % SOLN, Place 1 drop into both eyes at bedtime., Disp: , Rfl:    Multiple Vitamins-Minerals (ALIVE ONCE DAILY WOMENS 50+ PO), Take 1 tablet by mouth daily., Disp: , Rfl:    Omega-3 Fatty Acids (FISH OIL) 1200 MG CAPS, Take 1,200 mg by mouth at bedtime. Taking 2 capsules at night, Disp: , Rfl:    ondansetron (ZOFRAN) 4 MG tablet, Take 1 tablet (4 mg total) by mouth every 8 (eight) hours as needed for nausea or vomiting., Disp: 20 tablet, Rfl:  0   spironolactone (ALDACTONE) 25 MG tablet, Take 1 tablet (25 mg total) by mouth daily., Disp: 90 tablet, Rfl: 3   torsemide (DEMADEX) 20 MG tablet, Take 2 tablets (40 mg total) by mouth daily., Disp: 60 tablet, Rfl: 11   Travoprost, BAK Free, (TRAVATAN) 0.004 % SOLN ophthalmic solution, Place 1 drop into both eyes at bedtime., Disp: , Rfl:    insulin aspart (NOVOLOG FLEXPEN) 100 UNIT/ML FlexPen, DIAL AND INJECT UNDER THE SKIN THREE TIMES DAILY PER CORRECTION SCALE. MAX DAILY DOSE 50 UNITS (Patient not taking: Reported on 08/08/2023), Disp: 15 mL, Rfl: 0   Insulin Lispro (HUMALOG ), Inject 8 Units into the skin in the morning, at noon, and at bedtime. (Patient not taking: Reported on 08/08/2023), Disp: , Rfl:    methylPREDNISolone (MEDROL DOSEPAK) 4 MG TBPK tablet, Take as directed (Patient not taking: Reported on 07/25/2023), Disp: 21 tablet, Rfl: 0 Allergies  Allergen Reactions   Codeine Other (See Comments)   Other Other (See Comments)    Blood Product Refusal (Jehovah's witness)     Tape Other (See Comments)    Leaves blisters and marks on the skin   Sulfa Antibiotics Rash   Uloric [Febuxostat] Rash      Social History   Socioeconomic History   Marital status: Widowed    Spouse name: Not on file   Number of children: 3   Years of education: Not on file   Highest education level: Some college, no degree  Occupational History   Occupation: retired  Tobacco Use   Smoking status: Former    Current packs/day: 0.00    Average packs/day: 1 pack/day for 22.0 years (22.0 ttl pk-yrs)    Types: Cigarettes    Start date: 43    Quit date: 71    Years since quitting: 42.2   Smokeless tobacco: Never  Vaping Use   Vaping status: Never Used  Substance and Sexual Activity   Alcohol use: Yes    Comment: 05/22/2018 "glass of wine couple times/wk"   Drug use: Not Currently   Sexual activity: Not Currently    Birth control/protection: None, Post-menopausal  Other Topics Concern   Not on file  Social History Narrative   Not on file   Social Drivers of Health   Financial Resource Strain: Medium Risk (12/22/2021)   Overall Financial Resource Strain (CARDIA)    Difficulty of Paying Living Expenses: Somewhat hard  Food Insecurity: No Food Insecurity (05/08/2018)   Hunger Vital Sign    Worried About Running Out of Food in the Last Year: Never true    Ran Out of Food in the Last Year: Never true  Transportation Needs: Unmet Transportation Needs (05/08/2022)   PRAPARE - Administrator, Civil Service (Medical): Yes    Lack of Transportation (Non-Medical): Yes  Physical Activity: Not on file  Stress: Not on file  Social Connections: Not on file  Intimate Partner Violence: Not on file    Physical Exam      Future Appointments  Date Time Provider Department Center  08/23/2023 11:00 AM [facility]  09/12/2023  7:15 AM [facility] DEVICE REMOTES [facility]  09/18/2023 11:00 AM [facility] None  12/12/2023  7:15 AM [facility] DEVICE REMOTES [facility]  03/12/2024  7:15 AM [facility] DEVICE REMOTES  [facility]  06/11/2024  7:15 AM [facility] DEVICE REMOTES [facility]  09/10/2024  7:15 AM [facility] DEVICE REMOTES [facility]  12/10/2024  7:15 AM [facility] DEVICE REMOTES  [facility]  03/11/2025  7:15 AM [facility] DEVICE REMOTES [facility]  06/10/2025  7:15 AM [facility] DEVICE REMOTES [facility]  09/09/2025  7:15 AM [facility] DEVICE REMOTES [facility]

## 2023-08-15 ENCOUNTER — Other Ambulatory Visit (HOSPITAL_COMMUNITY): Payer: Self-pay

## 2023-08-15 ENCOUNTER — Other Ambulatory Visit (HOSPITAL_COMMUNITY): Payer: Self-pay | Admitting: Internal Medicine

## 2023-08-15 NOTE — Progress Notes (Signed)
 Paramedicine Encounter    Patient ID: Danielle [name], female    DOB: [birth year], 78 y.o.   MRN: [id number]   Complaints- none   Assessment- CAOx4, warm and dry reporting to be feeling well. 4 lbs weight gain, lower right leg swelling, no shortness of breath, no chest pain, no dizziness.   Compliance with meds- missed two mornings and one evening dose.   Pill box filled- for one week   Refills needed- spironolactone and eliquis   Meds changes since last visit-none     Social changes- plans to go to [place] for a month in April to May   VISIT SUMMARY- Arrived for home visit for [facility] who reports to be feeling good with no complaints today. She denied shortness of breath, chest pain or dizziness. She does have a 4 lb weight gain this week from last week- he has some lower right leg swelling. She did miss two morning doses over the last week and one evening dose. I reminded her the importance of taking her meds every day. She understood. Vitals obtained and as noted. Assessment as noted. I educated her on heart healthy diet and reminded her to be eating low sodium diet. She verbalized understanding. We reviewed upcoming appointments. Home visit complete. [name].   BP 118/62   Pulse 75   Resp 16   Wt 198 lb (89.8 kg)   SpO2 98%   BMI 33.99 kg/m  Weight yesterday-- 198lbs  Last visit weight-- 194lbs      ACTION: Home visit completed     Patient Care Team: Renford Dills, MD as PCP - General (Internal Medicine) Regan Lemming, MD as PCP - Electrophysiology (Cardiology) Quintella Reichert, MD as PCP - Sleep Medicine (Cardiology) Bensimhon, Bevelyn Buckles, MD as PCP - Cardiology (Cardiology) Burna Sis, LCSW as Social Worker (Licensed Clinical Social Worker) Shamleffer, Konrad Dolores, MD as Consulting Physician (Endocrinology)  Patient Active Problem List   Diagnosis Date Noted   Chronic systolic heart failure (HCC) 05/08/2022   Type 2 diabetes  mellitus with stage 3a chronic kidney disease, with long-term current use of insulin (HCC) 02/16/2022   Prosthetic joint implant failure, initial encounter (HCC) 12/20/2021   Pseudophakia of both eyes 11/15/2021   DVT (deep venous thrombosis) (HCC) 07/31/2021   Acute blood loss anemia    Septic Arthritis, Infection of prosthetic right knee joint (HCC), MSSA infection 07/10/2021   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) 07/10/2021   CAD (coronary artery disease) 06/08/2021   Carotid artery disease (HCC) 06/08/2021   Symptomatic bradycardia 06/08/2021   Paroxysmal atrial fibrillation (HCC) 06/08/2021   CKD (chronic kidney disease), stage III (HCC) 06/08/2021   Polyneuropathy associated with underlying disease (HCC) 05/04/2020   Severe nonproliferative diabetic retinopathy of left eye, with macular edema, associated with type 2 diabetes mellitus (HCC) 11/05/2019   Severe nonproliferative diabetic retinopathy of right eye, with macular edema, associated with type 2 diabetes mellitus (HCC) 11/05/2019   Retinal hemorrhage of right eye 11/05/2019   Retinal hemorrhage of left eye 11/05/2019   Diabetes mellitus (HCC) 05/14/2019   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, with long-term current use of insulin (HCC) 05/14/2019   Type 2 diabetes mellitus with diabetic polyneuropathy, with long-term current use of insulin (HCC) 05/14/2019   History of gout 05/20/2018   Primary localized osteoarthritis of right knee 05/13/2018   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) 12/20/2017   Obesity (BMI 30-39.9) 11/11/2017  OSA (obstructive sleep apnea) 02/18/2017   Essential hypertension    L Breast Cancer    Hypothyroidism     Current Outpatient Medications:    acetaminophen (TYLENOL) 500 MG tablet, Take 500-1,000 mg by mouth every 6 (six) hours as needed (for pain or headaches)., Disp: , Rfl:    allopurinol (ZYLOPRIM) 100 MG tablet, Take 100 mg by mouth daily., Disp: ,  Rfl:    apixaban (ELIQUIS) 5 MG TABS tablet, Take 1 tablet (5 mg total) by mouth 2 (two) times daily., Disp: 60 tablet, Rfl: 11   atorvastatin (LIPITOR) 80 MG tablet, Take 1 tablet (80 mg total) by mouth at bedtime., Disp: 100 tablet, Rfl: 3   B Complex-Biotin-FA (B-COMPLEX PO), Take 1 tablet by mouth daily., Disp: , Rfl:    Biotin 10 MG TABS, Take 10 mg by mouth daily., Disp: , Rfl:    Blood Glucose Monitoring Suppl (ONETOUCH VERIO) w/Device KIT, Use to check blood sugar, Disp: 1 kit, Rfl: 0   carvedilol (COREG) 3.125 MG tablet, Take 1 tablet (3.125 mg total) by mouth 2 (two) times daily with a meal., Disp: 180 tablet, Rfl: 3   Cinnamon 500 MG capsule, Take 500 mg by mouth daily., Disp: , Rfl:    clotrimazole-betamethasone (LOTRISONE) lotion, Apply 1 application topically 2 (two) times daily as needed (itching)., Disp: , Rfl:    Continuous Glucose Sensor (DEXCOM G6 SENSOR) MISC, 1 Device by Does not apply route as directed., Disp: 9 each, Rfl: 3   Continuous Glucose Transmitter (DEXCOM G6 TRANSMITTER) MISC, Change every 90 days, Disp: 1 each, Rfl: 3   empagliflozin (JARDIANCE) 10 MG TABS tablet, Take 1 tablet (10 mg total) by mouth daily., Disp: 30 tablet, Rfl: 11   ferrous sulfate 325 (65 FE) MG tablet, Take 1 tablet (325 mg total) by mouth 3 (three) times a week., Disp: 45 tablet, Rfl: 3   folic acid (FOLVITE) 1 MG tablet, Take 1 tablet by mouth once daily, Disp: 90 tablet, Rfl: 3   gabapentin (NEURONTIN) 300 MG capsule, Take 300 mg by mouth 2 (two) times daily., Disp: , Rfl:    glucose blood (ONETOUCH VERIO) test strip, Check blood sugar 2 times daily, Disp: 100 each, Rfl: 12   Insulin Glargine (BASAGLAR KWIKPEN) 100 UNIT/ML, Inject 40 Units into the skin at bedtime., Disp: 45 mL, Rfl: 2   insulin lispro (HUMALOG KWIKPEN) 100 UNIT/ML KwikPen, Take HUmalog 8 units with each meal PLUS Humalog correctional insulin scale max daily dose 50 units, Disp: 30 mL, Rfl: 3   Insulin Pen Needle 32G X 4 MM  MISC, 1 Device by Does not apply route in the morning, at noon, in the evening, and at bedtime., Disp: 400 each, Rfl: 3   Lancets (ONETOUCH DELICA PLUS LANCET33G) MISC, USE   TO CHECK GLUCOSE 4 TIMES DAILY, Disp: 100 each, Rfl: 3   latanoprost (XALATAN) 0.005 % ophthalmic solution, Place 1 drop into both eyes at bedtime., Disp: , Rfl:    levothyroxine (SYNTHROID, LEVOTHROID) 75 MCG tablet, Take 75 mcg by mouth daily before breakfast., Disp: , Rfl:    LUMIGAN 0.01 % SOLN, Place 1 drop into both eyes at bedtime., Disp: , Rfl:    Multiple Vitamins-Minerals (ALIVE ONCE DAILY WOMENS 50+ PO), Take 1 tablet by mouth daily., Disp: , Rfl:    Omega-3 Fatty Acids (FISH OIL) 1200 MG CAPS, Take 1,200 mg by mouth at bedtime. Taking 2 capsules at night, Disp: , Rfl:    ondansetron (ZOFRAN) 4 MG  tablet, Take 1 tablet (4 mg total) by mouth every 8 (eight) hours as needed for nausea or vomiting., Disp: 20 tablet, Rfl: 0   spironolactone (ALDACTONE) 25 MG tablet, Take 1 tablet (25 mg total) by mouth daily., Disp: 90 tablet, Rfl: 3   torsemide (DEMADEX) 20 MG tablet, Take 2 tablets (40 mg total) by mouth daily., Disp: 60 tablet, Rfl: 11   Travoprost, BAK Free, (TRAVATAN) 0.004 % SOLN ophthalmic solution, Place 1 drop into both eyes at bedtime., Disp: , Rfl:    insulin aspart (NOVOLOG FLEXPEN) 100 UNIT/ML FlexPen, DIAL AND INJECT UNDER THE SKIN THREE TIMES DAILY PER CORRECTION SCALE. MAX DAILY DOSE 50 UNITS (Patient not taking: Reported on 08/08/2023), Disp: 15 mL, Rfl: 0   Insulin Lispro (HUMALOG [place]), Inject 8 Units into the skin in the morning, at noon, and at bedtime. (Patient not taking: Reported on 08/15/2023), Disp: , Rfl:    methylPREDNISolone (MEDROL DOSEPAK) 4 MG TBPK tablet, Take as directed (Patient not taking: Reported on 08/15/2023), Disp: 21 tablet, Rfl: 0 Allergies  Allergen Reactions   Codeine Other (See Comments)   Other Other (See Comments)    Blood Product Refusal (Jehovah's witness)     Tape Other  (See Comments)    Leaves blisters and marks on the skin   Sulfa Antibiotics Rash   Uloric [Febuxostat] Rash     Social History   Socioeconomic History   Marital status: Widowed    Spouse name: Not on file   Number of children: 3   Years of education: Not on file   Highest education level: Some college, no degree  Occupational History   Occupation: retired  Tobacco Use   Smoking status: Former    Current packs/day: 0.00    Average packs/day: 1 pack/day for 22.0 years (22.0 ttl pk-yrs)    Types: Cigarettes    Start date: 52    Quit date: 50    Years since quitting: 42.2   Smokeless tobacco: Never  Vaping Use   Vaping status: Never Used  Substance and Sexual Activity   Alcohol use: Yes    Comment: 05/22/2018 "glass of wine couple times/wk"   Drug use: Not Currently   Sexual activity: Not Currently    Birth control/protection: None, Post-menopausal  Other Topics Concern   Not on file  Social History Narrative   Not on file   Social Drivers of Health   Financial Resource Strain: Medium Risk (12/22/2021)   Overall Financial Resource Strain (CARDIA)    Difficulty of Paying Living Expenses: Somewhat hard  Food Insecurity: No Food Insecurity (05/08/2018)   Hunger Vital Sign    Worried About Running Out of Food in the Last Year: Never true    Ran Out of Food in the Last Year: Never true  Transportation Needs: Unmet Transportation Needs (05/08/2022)   PRAPARE - Administrator, Civil Service (Medical): Yes    Lack of Transportation (Non-Medical): Yes  Physical Activity: Not on file  Stress: Not on file  Social Connections: Not on file  Intimate Partner Violence: Not on file    Physical Exam      Future Appointments  Date Time Provider Department Center  08/21/2023 10:15 AM Louann Sjogren, DPM [facility]  08/23/2023 11:00 AM [facility]  09/12/2023  7:15 AM [facility]OT[facility]   09/18/2023 11:00 AM [facility] None  12/12/2023  7:15 AM [facility] DEVICE REMOTES [facility]  03/12/2024  7:15 AM [facility]  DEVICE REMOTES [facility]  06/11/2024  7:15 AM [facility] DEVICE REMOTES [facility]  09/10/2024  7:15 AM [facility] DEVICE REMOTES [facility]  12/10/2024  7:15 AM [facility]ICE REMOT[facility]  03/11/2025  7:15 AM [facility]ICE REMOTES [facility]  06/10/2025  7:15 AM [facility] REMOTES [facility]  09/09/2025  7:15 AM [facility] DEVICE REMOTES [facility]

## 2023-08-21 ENCOUNTER — Ambulatory Visit (INDEPENDENT_AMBULATORY_CARE_PROVIDER_SITE_OTHER): Admitting: Podiatry

## 2023-08-21 ENCOUNTER — Telehealth (INDEPENDENT_AMBULATORY_CARE_PROVIDER_SITE_OTHER): Payer: Self-pay | Admitting: Otolaryngology

## 2023-08-21 DIAGNOSIS — Z91199 Patient's noncompliance with other medical treatment and regimen due to unspecified reason: Secondary | ICD-10-CM

## 2023-08-21 NOTE — Progress Notes (Signed)
 No show

## 2023-08-21 NOTE — Telephone Encounter (Signed)
 Patient left message to reschedule 04/23 appointment. Returned call to patient. Waiting on return call.

## 2023-08-22 ENCOUNTER — Telehealth (HOSPITAL_COMMUNITY): Payer: Self-pay

## 2023-08-22 ENCOUNTER — Other Ambulatory Visit (HOSPITAL_COMMUNITY): Payer: Self-pay

## 2023-08-22 NOTE — Telephone Encounter (Signed)
 Eliquis noted to be $450 for a 30 day supply with insurance. I will make HF team aware- she has spent $292 for the year so far. I will need to get samples for her until she can afford the copay unless HF team has any assistance. I will forward to [facility] for follow up.   [name], EMT-Paramedic [phone] 08/22/2023

## 2023-08-22 NOTE — Progress Notes (Signed)
 Paramedicine Encounter    Patient ID: Danielle [name], female    DOB: 1945/07/03, 78 y.o.   MRN: [id number]   Complaints- left second toe pain   Assessment- CAOX4, warm and dry reporting to be feeling well today aside from left toe pain. She follows up with [facility] tomorrow. Vitals obtained and are within normal range for her. Lungs clear. Lower leg edema minimal.   Compliance with meds- no missed doses over the last week   Pill box filled- for one week   Refills needed- eliquis   Meds changes since last visit- none     Social changes- none    VISIT SUMMARY- Arrived for home visit for [facility] who reports to be feeling well other than her left second toe pain. She will see [facility] tomorrow for same. I obtained vitals and assessment as noted. Lungs clear with no minimal lower leg edema. She has been med compliant. Urinating well- no blood in stool or urine. I reviewed meds and filled pill box for one week. Refills as noted called into [facility]. Eliquis noted to be $450 for a 30 day supply with insurance. I will make HF team aware- she has spent $292 for the year so far. I will need to get samples for her until she can afford the copay unless HF team has any assistance.    We reviewed upcoming appointments. I will see Rande in one week.   There were no vitals taken for this visit. Weight yesterday-- 198lbs  Last visit weight-- 198lbs      ACTION: Home visit completed     Patient Care Team: Renford Dills, MD as PCP - General (Internal Medicine) Regan Lemming, MD as PCP - Electrophysiology (Cardiology) Quintella Reichert, MD as PCP - Sleep Medicine (Cardiology) Bensimhon, Bevelyn Buckles, MD as PCP - Cardiology (Cardiology) Burna Sis, LCSW as Social Worker (Licensed Clinical Social Worker) Shamleffer, Konrad Dolores, MD as Consulting Physician (Endocrinology)  Patient Active Problem List   Diagnosis Date Noted   Chronic systolic heart failure  (HCC) 40/98/1191   Type 2 diabetes mellitus with stage 3a chronic kidney disease, with long-term current use of insulin (HCC) 02/16/2022   Prosthetic joint implant failure, initial encounter (HCC) 12/20/2021   Pseudophakia of both eyes 11/15/2021   DVT (deep venous thrombosis) (HCC) 07/31/2021   Acute blood loss anemia    Septic Arthritis, Infection of prosthetic right knee joint (HCC), MSSA infection 07/10/2021   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) 07/10/2021   CAD (coronary artery disease) 06/08/2021   Carotid artery disease (HCC) 06/08/2021   Symptomatic bradycardia 06/08/2021   Paroxysmal atrial fibrillation (HCC) 06/08/2021   CKD (chronic kidney disease), stage III (HCC) 06/08/2021   Polyneuropathy associated with underlying disease (HCC) 05/04/2020   Severe nonproliferative diabetic retinopathy of left eye, with macular edema, associated with type 2 diabetes mellitus (HCC) 11/05/2019   Severe nonproliferative diabetic retinopathy of right eye, with macular edema, associated with type 2 diabetes mellitus (HCC) 11/05/2019   Retinal hemorrhage of right eye 11/05/2019   Retinal hemorrhage of left eye 11/05/2019   Diabetes mellitus (HCC) 05/14/2019   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, with long-term current use of insulin (HCC) 05/14/2019   Type 2 diabetes mellitus with diabetic polyneuropathy, with long-term current use of insulin (HCC) 05/14/2019   History of gout 05/20/2018   Primary localized osteoarthritis of right knee 05/13/2018   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) 12/20/2017  Obesity (BMI 30-39.9) 11/11/2017   OSA (obstructive sleep apnea) 02/18/2017   Essential hypertension    L Breast Cancer    Hypothyroidism     Current Outpatient Medications:    acetaminophen (TYLENOL) 500 MG tablet, Take 500-1,000 mg by mouth every 6 (six) hours as needed (for pain or headaches)., Disp: , Rfl:    allopurinol (ZYLOPRIM) 100 MG tablet,  Take 100 mg by mouth daily., Disp: , Rfl:    apixaban (ELIQUIS) 5 MG TABS tablet, Take 1 tablet by mouth twice daily, Disp: 180 tablet, Rfl: 3   atorvastatin (LIPITOR) 80 MG tablet, Take 1 tablet (80 mg total) by mouth at bedtime., Disp: 100 tablet, Rfl: 3   B Complex-Biotin-FA (B-COMPLEX PO), Take 1 tablet by mouth daily., Disp: , Rfl:    Biotin 10 MG TABS, Take 10 mg by mouth daily., Disp: , Rfl:    Blood Glucose Monitoring Suppl (ONETOUCH VERIO) w/Device KIT, Use to check blood sugar, Disp: 1 kit, Rfl: 0   carvedilol (COREG) 3.125 MG tablet, Take 1 tablet (3.125 mg total) by mouth 2 (two) times daily with a meal., Disp: 180 tablet, Rfl: 3   Cinnamon 500 MG capsule, Take 500 mg by mouth daily., Disp: , Rfl:    clotrimazole-betamethasone (LOTRISONE) lotion, Apply 1 application topically 2 (two) times daily as needed (itching)., Disp: , Rfl:    Continuous Glucose Sensor (DEXCOM G6 SENSOR) MISC, 1 Device by Does not apply route as directed., Disp: 9 each, Rfl: 3   Continuous Glucose Transmitter (DEXCOM G6 TRANSMITTER) MISC, Change every 90 days, Disp: 1 each, Rfl: 3   empagliflozin (JARDIANCE) 10 MG TABS tablet, Take 1 tablet (10 mg total) by mouth daily., Disp: 30 tablet, Rfl: 11   ferrous sulfate 325 (65 FE) MG tablet, Take 1 tablet (325 mg total) by mouth 3 (three) times a week., Disp: 45 tablet, Rfl: 3   folic acid (FOLVITE) 1 MG tablet, Take 1 tablet by mouth once daily, Disp: 90 tablet, Rfl: 3   gabapentin (NEURONTIN) 300 MG capsule, Take 300 mg by mouth 2 (two) times daily., Disp: , Rfl:    glucose blood (ONETOUCH VERIO) test strip, Check blood sugar 2 times daily, Disp: 100 each, Rfl: 12   Insulin Glargine (BASAGLAR KWIKPEN) 100 UNIT/ML, Inject 40 Units into the skin at bedtime., Disp: 45 mL, Rfl: 2   insulin lispro (HUMALOG KWIKPEN) 100 UNIT/ML KwikPen, Take HUmalog 8 units with each meal PLUS Humalog correctional insulin scale max daily dose 50 units, Disp: 30 mL, Rfl: 3   Insulin Pen  Needle 32G X 4 MM MISC, 1 Device by Does not apply route in the morning, at noon, in the evening, and at bedtime., Disp: 400 each, Rfl: 3   Lancets (ONETOUCH DELICA PLUS LANCET33G) MISC, USE   TO CHECK GLUCOSE 4 TIMES DAILY, Disp: 100 each, Rfl: 3   latanoprost (XALATAN) 0.005 % ophthalmic solution, Place 1 drop into both eyes at bedtime., Disp: , Rfl:    levothyroxine (SYNTHROID, LEVOTHROID) 75 MCG tablet, Take 75 mcg by mouth daily before breakfast., Disp: , Rfl:    LUMIGAN 0.01 % SOLN, Place 1 drop into both eyes at bedtime., Disp: , Rfl:    Multiple Vitamins-Minerals (ALIVE ONCE DAILY WOMENS 50+ PO), Take 1 tablet by mouth daily., Disp: , Rfl:    Omega-3 Fatty Acids (FISH OIL) 1200 MG CAPS, Take 1,200 mg by mouth at bedtime. Taking 2 capsules at night, Disp: , Rfl:    ondansetron (ZOFRAN) 4  MG tablet, Take 1 tablet (4 mg total) by mouth every 8 (eight) hours as needed for nausea or vomiting., Disp: 20 tablet, Rfl: 0   spironolactone (ALDACTONE) 25 MG tablet, Take 1 tablet (25 mg total) by mouth daily., Disp: 90 tablet, Rfl: 3   torsemide (DEMADEX) 20 MG tablet, Take 2 tablets (40 mg total) by mouth daily., Disp: 60 tablet, Rfl: 11   Travoprost, BAK Free, (TRAVATAN) 0.004 % SOLN ophthalmic solution, Place 1 drop into both eyes at bedtime., Disp: , Rfl:    insulin aspart (NOVOLOG FLEXPEN) 100 UNIT/ML FlexPen, DIAL AND INJECT UNDER THE SKIN THREE TIMES DAILY PER CORRECTION SCALE. MAX DAILY DOSE 50 UNITS (Patient not taking: Reported on 08/22/2023), Disp: 15 mL, Rfl: 0   Insulin Lispro (HUMALOG [place]), Inject 8 Units into the skin in the morning, at noon, and at bedtime. (Patient not taking: Reported on 08/08/2023), Disp: , Rfl:    methylPREDNISolone (MEDROL DOSEPAK) 4 MG TBPK tablet, Take as directed (Patient not taking: Reported on 07/25/2023), Disp: 21 tablet, Rfl: 0 Allergies  Allergen Reactions   Codeine Other (See Comments)   Other Other (See Comments)    Blood Product Refusal (Jehovah's  witness)     Tape Other (See Comments)    Leaves blisters and marks on the skin   Sulfa Antibiotics Rash   Uloric [Febuxostat] Rash     Social History   Socioeconomic History   Marital status: Widowed    Spouse name: Not on file   Number of children: 3   Years of education: Not on file   Highest education level: Some college, no degree  Occupational History   Occupation: retired  Tobacco Use   Smoking status: Former    Current packs/day: 0.00    Average packs/day: 1 pack/day for 22.0 years (22.0 ttl pk-yrs)    Types: Cigarettes    Start date: 37    Quit date: 59    Years since quitting: 42.2   Smokeless tobacco: Never  Vaping Use   Vaping status: Never Used  Substance and Sexual Activity   Alcohol use: Yes    Comment: 05/22/2018 "glass of wine couple times/wk"   Drug use: Not Currently   Sexual activity: Not Currently    Birth control/protection: None, Post-menopausal  Other Topics Concern   Not on file  Social History Narrative   Not on file   Social Drivers of Health   Financial Resource Strain: Medium Risk (12/22/2021)   Overall Financial Resource Strain (CARDIA)    Difficulty of Paying Living Expenses: Somewhat hard  Food Insecurity: No Food Insecurity (05/08/2018)   Hunger Vital Sign    Worried About Running Out of Food in the Last Year: Never true    Ran Out of Food in the Last Year: Never true  Transportation Needs: Unmet Transportation Needs (05/08/2022)   PRAPARE - Administrator, Civil Service (Medical): Yes    Lack of Transportation (Non-Medical): Yes  Physical Activity: Not on file  Stress: Not on file  Social Connections: Not on file  Intimate Partner Violence: Not on file    Physical Exam      Future Appointments  Date Time Provider Department Center  08/23/2023 11:00 AM [facility]  09/12/2023  7:15 AM [facility] DEVICE REMOTES [facility]  10/29/2023  3:30 PM [address]  [facility] None  12/12/2023  7:15 AM [facility] DEVICE REMOTES [facility]  03/12/2024  7:15 AM [facility] DEVICE REMOTES [facility]  06/11/2024  7:15 AM [facility] DEVICE REMOTES [facility]  09/10/2024  7:15 AM [facility] DEVICE REMOTES [facility]  12/10/2024  7:15 AM [facility] DEVICE REMOTES [facility]  03/11/2025  7:15 AM [facility] DEVICE REMOTES [facility]  06/10/2025  7:15 AM [facility] DEVICE REMOTES [facility]  09/09/2025  7:15 AM [facility] DEVICE REMOTES [facility]

## 2023-08-23 ENCOUNTER — Ambulatory Visit: Payer: Medicare HMO

## 2023-08-23 NOTE — Progress Notes (Signed)
 Shoes were too tight sending back and ordered 2 other pair to try  Reset fitting appt to 4/10 at 3:40pm   Danielle [name] Cped, CFo, CFm

## 2023-08-26 ENCOUNTER — Other Ambulatory Visit (HOSPITAL_COMMUNITY): Payer: Self-pay

## 2023-08-26 ENCOUNTER — Ambulatory Visit: Admitting: Podiatry

## 2023-08-26 DIAGNOSIS — L97521 Non-pressure chronic ulcer of other part of left foot limited to breakdown of skin: Secondary | ICD-10-CM

## 2023-08-26 DIAGNOSIS — E1151 Type 2 diabetes mellitus with diabetic peripheral angiopathy without gangrene: Secondary | ICD-10-CM

## 2023-08-26 NOTE — Telephone Encounter (Signed)
 Spoke with patient by phone about BMS 3% criteria. Patient is unsure if she has met this requirement but will obtain a pharmacy printout to review and reach out to me to begin application process, if needed.  Of note, this patient may also be eligible for the Medicare Extra Help Program that would also help reduce the cost of all medications.

## 2023-08-26 NOTE — Telephone Encounter (Signed)
 Medication Samples have been provided to the patient.  Drug name: eliquis       Strength: 5mg         Qty: 56  LOT: [id number]  Exp.Date: 07/2024  Dosing instructions: ONE TAB TWICE A DAY  The patient has been instructed regarding the correct time, dose, and frequency of taking this medication, including desired effects and most common side effects.   Magda Bernheim M 2:16 PM 08/26/2023

## 2023-08-26 NOTE — Progress Notes (Signed)
 Subjective:  Patient ID: Danielle [name], female    DOB: 22-Mar-1946,  MRN: [id number]  No chief complaint on file.   78 y.o. female returns for follow-up of left great toe wound.  Relates doing well. Marland Kitchen Has been dressing the great toe as instructed. She did miss her last appointment.    Review of Systems: Negative except as noted in the HPI. Denies N/V/F/Ch.  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HCC) 02/05/2017   Acute gout due to renal impairment involving right wrist 05/20/2018   Acute kidney failure (HCC) 05/15/2018   Arthritis    "all over" (05/22/2018)   Blood dyscrasia    per pt-has small blood cells-appears as if anemic   Breast cancer, left breast (HCC) 1985   CHF (congestive heart failure) (HCC)    Complication of anesthesia    difficult to awaken from per pt   Coronary artery disease    Dyspnea    Essential hypertension    Heart disease    Heart murmur    History of gout    Hypothyroidism    Obesity (BMI 30-39.9) 11/11/2017   OSA (obstructive sleep apnea) 02/18/2017    severe obstructive sleep apnea with an AHI of 75.6/h and mild central sleep apnea with a CAI of 7.7/h.  Oxygen saturations dropped as low as 82%.   He is on CPAP at 9 cm H2O.   OSA on CPAP    Personal history of chemotherapy    Personal history of radiation therapy    Presence of permanent cardiac pacemaker    Primary localized osteoarthritis of right knee 05/13/2018   Refusal of blood transfusions as patient is Jehovah's Witness    Type II diabetes mellitus (HCC)     Current Outpatient Medications:    acetaminophen (TYLENOL) 500 MG tablet, Take 500-1,000 mg by mouth every 6 (six) hours as needed (for pain or headaches)., Disp: , Rfl:    allopurinol (ZYLOPRIM) 100 MG tablet, Take 100 mg by mouth daily., Disp: , Rfl:    apixaban (ELIQUIS) 5 MG TABS tablet, Take 1 tablet by mouth twice daily, Disp: 180 tablet, Rfl: 3   atorvastatin  (LIPITOR) 80 MG tablet, Take 1 tablet (80 mg total) by mouth at bedtime., Disp: 100 tablet, Rfl: 3   B Complex-Biotin-FA (B-COMPLEX PO), Take 1 tablet by mouth daily., Disp: , Rfl:    Biotin 10 MG TABS, Take 10 mg by mouth daily., Disp: , Rfl:    Blood Glucose Monitoring Suppl (ONETOUCH VERIO) w/Device KIT, Use to check blood sugar, Disp: 1 kit, Rfl: 0   carvedilol (COREG) 3.125 MG tablet, Take 1 tablet (3.125 mg total) by mouth 2 (two) times daily with a meal., Disp: 180 tablet, Rfl: 3   Cinnamon 500 MG capsule, Take 500 mg by mouth daily., Disp: , Rfl:    clotrimazole-betamethasone (LOTRISONE) lotion, Apply 1 application topically 2 (two) times daily as needed (itching)., Disp: , Rfl:    Continuous Glucose Sensor (DEXCOM G6 SENSOR) MISC, 1 Device by Does not apply route as directed., Disp: 9 each, Rfl: 3   Continuous Glucose Transmitter (DEXCOM G6 TRANSMITTER) MISC, Change every 90 days, Disp: 1 each, Rfl: 3   empagliflozin (JARDIANCE) 10 MG TABS tablet, Take 1 tablet (10 mg total) by mouth daily., Disp: 30 tablet, Rfl: 11   ferrous sulfate 325 (65 FE) MG tablet, Take 1 tablet (325 mg total) by mouth 3 (three) times a week., Disp:  45 tablet, Rfl: 3   folic acid (FOLVITE) 1 MG tablet, Take 1 tablet by mouth once daily, Disp: 90 tablet, Rfl: 3   gabapentin (NEURONTIN) 300 MG capsule, Take 300 mg by mouth 2 (two) times daily., Disp: , Rfl:    glucose blood (ONETOUCH VERIO) test strip, Check blood sugar 2 times daily, Disp: 100 each, Rfl: 12   insulin aspart (NOVOLOG FLEXPEN) 100 UNIT/ML FlexPen, DIAL AND INJECT UNDER THE SKIN THREE TIMES DAILY PER CORRECTION SCALE. MAX DAILY DOSE 50 UNITS (Patient not taking: Reported on 08/22/2023), Disp: 15 mL, Rfl: 0   Insulin Glargine (BASAGLAR KWIKPEN) 100 UNIT/ML, Inject 40 Units into the skin at bedtime., Disp: 45 mL, Rfl: 2   insulin lispro (HUMALOG KWIKPEN) 100 UNIT/ML KwikPen, Take HUmalog 8 units with each meal PLUS Humalog correctional insulin scale max  daily dose 50 units, Disp: 30 mL, Rfl: 3   Insulin Lispro (HUMALOG [place]), Inject 8 Units into the skin in the morning, at noon, and at bedtime. (Patient not taking: Reported on 08/08/2023), Disp: , Rfl:    Insulin Pen Needle 32G X 4 MM MISC, 1 Device by Does not apply route in the morning, at noon, in the evening, and at bedtime., Disp: 400 each, Rfl: 3   Lancets (ONETOUCH DELICA PLUS LANCET33G) MISC, USE   TO CHECK GLUCOSE 4 TIMES DAILY, Disp: 100 each, Rfl: 3   latanoprost (XALATAN) 0.005 % ophthalmic solution, Place 1 drop into both eyes at bedtime., Disp: , Rfl:    levothyroxine (SYNTHROID, LEVOTHROID) 75 MCG tablet, Take 75 mcg by mouth daily before breakfast., Disp: , Rfl:    LUMIGAN 0.01 % SOLN, Place 1 drop into both eyes at bedtime., Disp: , Rfl:    methylPREDNISolone (MEDROL DOSEPAK) 4 MG TBPK tablet, Take as directed (Patient not taking: Reported on 07/25/2023), Disp: 21 tablet, Rfl: 0   Multiple Vitamins-Minerals (ALIVE ONCE DAILY WOMENS 50+ PO), Take 1 tablet by mouth daily., Disp: , Rfl:    Omega-3 Fatty Acids (FISH OIL) 1200 MG CAPS, Take 1,200 mg by mouth at bedtime. Taking 2 capsules at night, Disp: , Rfl:    ondansetron (ZOFRAN) 4 MG tablet, Take 1 tablet (4 mg total) by mouth every 8 (eight) hours as needed for nausea or vomiting., Disp: 20 tablet, Rfl: 0   spironolactone (ALDACTONE) 25 MG tablet, Take 1 tablet (25 mg total) by mouth daily., Disp: 90 tablet, Rfl: 3   torsemide (DEMADEX) 20 MG tablet, Take 2 tablets (40 mg total) by mouth daily., Disp: 60 tablet, Rfl: 11   Travoprost, BAK Free, (TRAVATAN) 0.004 % SOLN ophthalmic solution, Place 1 drop into both eyes at bedtime., Disp: , Rfl:   Social History   Tobacco Use  Smoking Status Former   Current packs/day: 0.00   Average packs/day: 1 pack/day for 22.0 years (22.0 ttl pk-yrs)   Types: Cigarettes   Start date: 45   Quit date: 72   Years since quitting: 42.2  Smokeless Tobacco Never    Allergies  Allergen  Reactions   Codeine Other (See Comments)   Other Other (See Comments)    Blood Product Refusal (Jehovah's witness)     Tape Other (See Comments)    Leaves blisters and marks on the skin   Sulfa Antibiotics Rash   Uloric [Febuxostat] Rash   Objective:  Vascular: DP/PT pulses 2/4 bilateral. CFT <3 seconds. Absent hair growth on digits. Edema noted to bilateral lower extremities. Xerosis noted bilaterally.  Skin. No lacerations or abrasions  bilateral feet. Nails 1-5 bilateral  are thickened discolored and elongated with subungual debris. Left second digit amputation . Left hallux with hyperkeratosis noted and upon debridedment ulceration noted to plantar hallux with granular base. Nearly healed. No erythema edema or purulence noted. No probe to bone Musculoskeletal: MMT 5/5 bilateral lower extremities in DF, PF, Inversion and Eversion. Deceased ROM in DF of ankle joint. Left second digit amputation.  No tenderness erythema or edema noted to the great toe currently.  Neurological: Sensation intact to light touch. Protective sensation diminished bilateral.   Assessment:   1. Skin ulcer of left great toe, limited to breakdown of skin (HCC)   2. Type II diabetes mellitus with peripheral circulatory disorder [facility])          Plan:  Patient was evaluated and treated and all questions answered. Ulcer left hallux limited to skin breakdown.  Nearly healed.  -Debridement as below. -Dressed with betadine, DSD. -Off-loading with surgical shoe and offloading padding. Peg assist added to shoe to offload -Awaiting DM shoes -No abx indicated.  -Discussed glucose control and proper protein-rich diet.  -Discussed if any worsening redness, pain, fever or chills to call or may need to report to the emergency room. Patient expressed understanding.   Procedure: Excisional Debridement of Wound Rationale: Removal of non-viable soft tissue from the wound to promote healing.  Anesthesia:  none Pre-Debridement Wound Measurements: Overlying callus  Post-Debridement Wound Measurements: 0.3 cm x 0.3 cm x 0.1 cm  Type of Debridement: Sharp Excisional Tissue Removed: Non-viable soft tissue Depth of Debridement: subcutaneous tissue. Technique: Sharp excisional debridement to bleeding, viable wound base.  Dressing: Dry, sterile, compression dressing. Disposition: Patient tolerated procedure well. Patient to return in 2 week for follow-up.  Return in about 2 weeks (around 09/09/2023) for wound check.   Return in about 2 weeks (around 09/09/2023) for wound check.

## 2023-08-29 ENCOUNTER — Other Ambulatory Visit (HOSPITAL_COMMUNITY): Payer: Self-pay

## 2023-08-29 NOTE — Progress Notes (Signed)
 Paramedicine Encounter    Patient ID: Danielle [name], female    DOB: [birth year], 78 y.o.   MRN: [id number]   Complaints- none   Assessment- CAOX4 warm and dry seated at her kitchen table with no complaints. Vitals obtained as noted. Lungs clear, no edema more than baseline. No weight gain.   Compliance with meds- no missed doses   Pill box filled- for one week   Refills needed- none   Meds changes since last visit- none     Social changes- none    VISIT SUMMARY- Arrived for home visit for [facility] who reports to be feeling well with no complaints aside from family stress. She is preparing to leave for a month long trip to [place] to see her daughters. I will be preparing her meds for a month at our next visit as she leaves on the 15th. Vitals and assessment as noted. Appointments reviewed and wrote down for her. Home visit complete. [name].   BP (!) 110/58   Pulse 68   Resp 16   Wt 198 lb (89.8 kg)   SpO2 97%   BMI 33.99 kg/m  Weight yesterday-didn't weigh  Last visit weight-198lbs  CBG- 224      ACTION: Home visit completed     Patient Care Team: Renford Dills, MD as PCP - General (Internal Medicine) Regan Lemming, MD as PCP - Electrophysiology (Cardiology) Quintella Reichert, MD as PCP - Sleep Medicine (Cardiology) Bensimhon, Bevelyn Buckles, MD as PCP - Cardiology (Cardiology) Burna Sis, LCSW as Social Worker (Licensed Clinical Social Worker) Shamleffer, Konrad Dolores, MD as Consulting Physician (Endocrinology)  Patient Active Problem List   Diagnosis Date Noted   Chronic systolic heart failure (HCC) 05/08/2022   Type 2 diabetes mellitus with stage 3a chronic kidney disease, with long-term current use of insulin (HCC) 02/16/2022   Prosthetic joint implant failure, initial encounter (HCC) 12/20/2021   Pseudophakia of both eyes 11/15/2021   DVT (deep venous thrombosis) (HCC) 07/31/2021   Acute blood loss anemia    Septic  Arthritis, Infection of prosthetic right knee joint (HCC), MSSA infection 07/10/2021   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) 07/10/2021   CAD (coronary artery disease) 06/08/2021   Carotid artery disease (HCC) 06/08/2021   Symptomatic bradycardia 06/08/2021   Paroxysmal atrial fibrillation (HCC) 06/08/2021   CKD (chronic kidney disease), stage III (HCC) 06/08/2021   Polyneuropathy associated with underlying disease (HCC) 05/04/2020   Severe nonproliferative diabetic retinopathy of left eye, with macular edema, associated with type 2 diabetes mellitus (HCC) 11/05/2019   Severe nonproliferative diabetic retinopathy of right eye, with macular edema, associated with type 2 diabetes mellitus (HCC) 11/05/2019   Retinal hemorrhage of right eye 11/05/2019   Retinal hemorrhage of left eye 11/05/2019   Diabetes mellitus (HCC) 05/14/2019   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, with long-term current use of insulin (HCC) 05/14/2019   Type 2 diabetes mellitus with diabetic polyneuropathy, with long-term current use of insulin (HCC) 05/14/2019   History of gout 05/20/2018   Primary localized osteoarthritis of right knee 05/13/2018   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) 12/20/2017   Obesity (BMI 30-39.9) 11/11/2017   OSA (obstructive sleep apnea) 02/18/2017   Essential hypertension    L Breast Cancer    Hypothyroidism     Current Outpatient Medications:    acetaminophen (TYLENOL) 500 MG tablet, Take 500-1,000 mg by mouth every 6 (six) hours as needed (for pain  or headaches)., Disp: , Rfl:    allopurinol (ZYLOPRIM) 100 MG tablet, Take 100 mg by mouth daily., Disp: , Rfl:    apixaban (ELIQUIS) 5 MG TABS tablet, Take 1 tablet by mouth twice daily, Disp: 180 tablet, Rfl: 3   atorvastatin (LIPITOR) 80 MG tablet, Take 1 tablet (80 mg total) by mouth at bedtime., Disp: 100 tablet, Rfl: 3   B Complex-Biotin-FA (B-COMPLEX PO), Take 1 tablet by mouth daily., Disp: ,  Rfl:    Biotin 10 MG TABS, Take 10 mg by mouth daily., Disp: , Rfl:    Blood Glucose Monitoring Suppl (ONETOUCH VERIO) w/Device KIT, Use to check blood sugar, Disp: 1 kit, Rfl: 0   carvedilol (COREG) 3.125 MG tablet, Take 1 tablet (3.125 mg total) by mouth 2 (two) times daily with a meal., Disp: 180 tablet, Rfl: 3   Cinnamon 500 MG capsule, Take 500 mg by mouth daily., Disp: , Rfl:    clotrimazole-betamethasone (LOTRISONE) lotion, Apply 1 application topically 2 (two) times daily as needed (itching)., Disp: , Rfl:    Continuous Glucose Sensor (DEXCOM G6 SENSOR) MISC, 1 Device by Does not apply route as directed., Disp: 9 each, Rfl: 3   Continuous Glucose Transmitter (DEXCOM G6 TRANSMITTER) MISC, Change every 90 days, Disp: 1 each, Rfl: 3   empagliflozin (JARDIANCE) 10 MG TABS tablet, Take 1 tablet (10 mg total) by mouth daily., Disp: 30 tablet, Rfl: 11   ferrous sulfate 325 (65 FE) MG tablet, Take 1 tablet (325 mg total) by mouth 3 (three) times a week., Disp: 45 tablet, Rfl: 3   folic acid (FOLVITE) 1 MG tablet, Take 1 tablet by mouth once daily, Disp: 90 tablet, Rfl: 3   gabapentin (NEURONTIN) 300 MG capsule, Take 300 mg by mouth 2 (two) times daily., Disp: , Rfl:    glucose blood (ONETOUCH VERIO) test strip, Check blood sugar 2 times daily, Disp: 100 each, Rfl: 12   Insulin Glargine (BASAGLAR KWIKPEN) 100 UNIT/ML, Inject 40 Units into the skin at bedtime., Disp: 45 mL, Rfl: 2   insulin lispro (HUMALOG KWIKPEN) 100 UNIT/ML KwikPen, Take HUmalog 8 units with each meal PLUS Humalog correctional insulin scale max daily dose 50 units, Disp: 30 mL, Rfl: 3   Insulin Pen Needle 32G X 4 MM MISC, 1 Device by Does not apply route in the morning, at noon, in the evening, and at bedtime., Disp: 400 each, Rfl: 3   Lancets (ONETOUCH DELICA PLUS LANCET33G) MISC, USE   TO CHECK GLUCOSE 4 TIMES DAILY, Disp: 100 each, Rfl: 3   latanoprost (XALATAN) 0.005 % ophthalmic solution, Place 1 drop into both eyes at  bedtime., Disp: , Rfl:    levothyroxine (SYNTHROID, LEVOTHROID) 75 MCG tablet, Take 75 mcg by mouth daily before breakfast., Disp: , Rfl:    LUMIGAN 0.01 % SOLN, Place 1 drop into both eyes at bedtime., Disp: , Rfl:    Multiple Vitamins-Minerals (ALIVE ONCE DAILY WOMENS 50+ PO), Take 1 tablet by mouth daily., Disp: , Rfl:    Omega-3 Fatty Acids (FISH OIL) 1200 MG CAPS, Take 1,200 mg by mouth at bedtime. Taking 2 capsules at night, Disp: , Rfl:    ondansetron (ZOFRAN) 4 MG tablet, Take 1 tablet (4 mg total) by mouth every 8 (eight) hours as needed for nausea or vomiting., Disp: 20 tablet, Rfl: 0   spironolactone (ALDACTONE) 25 MG tablet, Take 1 tablet (25 mg total) by mouth daily., Disp: 90 tablet, Rfl: 3   torsemide (DEMADEX) 20 MG tablet,  Take 2 tablets (40 mg total) by mouth daily., Disp: 60 tablet, Rfl: 11   Travoprost, BAK Free, (TRAVATAN) 0.004 % SOLN ophthalmic solution, Place 1 drop into both eyes at bedtime., Disp: , Rfl:    insulin aspart (NOVOLOG FLEXPEN) 100 UNIT/ML FlexPen, DIAL AND INJECT UNDER THE SKIN THREE TIMES DAILY PER CORRECTION SCALE. MAX DAILY DOSE 50 UNITS (Patient not taking: Reported on 08/08/2023), Disp: 15 mL, Rfl: 0   Insulin Lispro (HUMALOG [place]), Inject 8 Units into the skin in the morning, at noon, and at bedtime. (Patient not taking: Reported on 08/29/2023), Disp: , Rfl:    methylPREDNISolone (MEDROL DOSEPAK) 4 MG TBPK tablet, Take as directed (Patient not taking: Reported on 08/29/2023), Disp: 21 tablet, Rfl: 0 Allergies  Allergen Reactions   Codeine Other (See Comments)   Other Other (See Comments)    Blood Product Refusal (Jehovah's witness)     Tape Other (See Comments)    Leaves blisters and marks on the skin   Sulfa Antibiotics Rash   Uloric [Febuxostat] Rash     Social History   Socioeconomic History   Marital status: Widowed    Spouse name: Not on file   Number of children: 3   Years of education: Not on file   Highest education level: Some college,  no degree  Occupational History   Occupation: retired  Tobacco Use   Smoking status: Former    Current packs/day: 0.00    Average packs/day: 1 pack/day for 22.0 years (22.0 ttl pk-yrs)    Types: Cigarettes    Start date: 77    Quit date: 30    Years since quitting: 42.2   Smokeless tobacco: Never  Vaping Use   Vaping status: Never Used  Substance and Sexual Activity   Alcohol use: Yes    Comment: 05/22/2018 "glass of wine couple times/wk"   Drug use: Not Currently   Sexual activity: Not Currently    Birth control/protection: None, Post-menopausal  Other Topics Concern   Not on file  Social History Narrative   Not on file   Social Drivers of Health   Financial Resource Strain: Medium Risk (12/22/2021)   Overall Financial Resource Strain (CARDIA)    Difficulty of Paying Living Expenses: Somewhat hard  Food Insecurity: No Food Insecurity (05/08/2018)   Hunger Vital Sign    Worried About Running Out of Food in the Last Year: Never true    Ran Out of Food in the Last Year: Never true  Transportation Needs: Unmet Transportation Needs (05/08/2022)   PRAPARE - Administrator, Civil Service (Medical): Yes    Lack of Transportation (Non-Medical): Yes  Physical Activity: Not on file  Stress: Not on file  Social Connections: Not on file  Intimate Partner Violence: Not on file    Physical Exam      Future Appointments  Date Time Provider Department Center  09/05/2023  3:40 PM [facility]  09/09/2023 11:15 AM Louann Sjogren, DPM [facility]  09/12/2023  7:15 AM [facility] DEVICE REMOTES [facility]  10/29/2023  3:30 PM [facility] None  12/12/2023  7:15 AM [facility] DEVICE REMOTES [facility]  03/12/2024  7:15 AM [facility] DEVICE REMOTES [facility]  06/11/2024  7:15 AM [facility] DEVICE REMOTES [facility]  09/10/2024  7:15 AM [facility] DEVICE REMOTES  [facility]  12/10/2024  7:15 AM [facility] DEVICE REMOTES [facility]  03/11/2025  7:15 AM [facility] DEVICE REMOTES [facility]  06/10/2025  7:15 AM [facility]  DEVICE REMOTES [facility]  09/09/2025  7:15 AM [facility] DEVICE REMOTES [facility]

## 2023-08-30 ENCOUNTER — Telehealth (INDEPENDENT_AMBULATORY_CARE_PROVIDER_SITE_OTHER): Payer: Self-pay

## 2023-08-30 NOTE — Telephone Encounter (Signed)
 Spoke to patient and informed her of Dr. Eliane Decree recommendation. Patient verbalizes understanding.

## 2023-09-03 ENCOUNTER — Telehealth (HOSPITAL_COMMUNITY): Payer: Self-pay

## 2023-09-03 NOTE — Telephone Encounter (Signed)
 Danielle [name] reports she has not yet received a call for scheduling a sleep study to update her CPAP. I will forward message to D. Jones as I had called and LVM on 08/01/23. I will continue to follow up.   Danielle [name], EMT-Paramedic [phone] 09/03/2023

## 2023-09-05 ENCOUNTER — Other Ambulatory Visit (HOSPITAL_COMMUNITY): Payer: Self-pay

## 2023-09-05 ENCOUNTER — Ambulatory Visit (INDEPENDENT_AMBULATORY_CARE_PROVIDER_SITE_OTHER)

## 2023-09-05 ENCOUNTER — Telehealth (HOSPITAL_COMMUNITY): Payer: Self-pay

## 2023-09-05 DIAGNOSIS — M2142 Flat foot [pes planus] (acquired), left foot: Secondary | ICD-10-CM

## 2023-09-05 DIAGNOSIS — E1151 Type 2 diabetes mellitus with diabetic peripheral angiopathy without gangrene: Secondary | ICD-10-CM | POA: Diagnosis not present

## 2023-09-05 DIAGNOSIS — L84 Corns and callosities: Secondary | ICD-10-CM | POA: Diagnosis not present

## 2023-09-05 DIAGNOSIS — M2141 Flat foot [pes planus] (acquired), right foot: Secondary | ICD-10-CM | POA: Diagnosis not present

## 2023-09-05 NOTE — Telephone Encounter (Signed)
 Danielle [name] is needing Eliquis samples- her daughter Danielle [name] plans to pick up for her tomorrow in clinic. Keisha asked to give samples to Emer for [facility] to pick up.   [name], EMT-Paramedic [phone] 09/05/2023

## 2023-09-05 NOTE — Progress Notes (Unsigned)
 Paramedicine Encounter    Patient ID: Danielle [name], female    DOB: [birth year], 78 y.o.   MRN: [id number]   Complaints- none   Assessment- CAOX4, warm and dry, ambulating without shortness of breath, lungs clear, edema at baseline.   Compliance with meds- no missed doses   Pill box filled- for one month- she is going to [place] to visit family.    Refills needed-  -eliquis samples -folic acid -jardiance -carvedilol   Meds changes since last visit- none     Social changes- going to [place] for one month  Needs to recertify for [facility]AT. I will work on application for her to complete upon her return home from [place].    VISIT SUMMARY- Arrived for home visit for [facility] who reports to be feeling well with no complaints. She denied any recent complaints and says she has been feeling well. I obtained vitals and assessment as noted. Lungs clear, edema at baseline. She denied any chest pain, dizziness or shortness of breath. I reviewed meds and confirmed same filling pill box for one month. She will be traveling to [place] for one month. I plan to see her upon her return she knows to reach out if any symptoms arise and or any needs. Meds and dexcom taken care of- her family is aware of how to replace same. Home visit complete.   BP 98/64   Pulse 74   Resp 16   Wt 200 lb (90.7 kg)   SpO2 96%   BMI 34.33 kg/m  Weight yesterday-- 198lbs  Last visit weight-- 198lbs     ACTION: Home visit completed     Patient Care Team: Renford Dills, MD as PCP - General (Internal Medicine) Regan Lemming, MD as PCP - Electrophysiology (Cardiology) Quintella Reichert, MD as PCP - Sleep Medicine (Cardiology) Bensimhon, Bevelyn Buckles, MD as PCP - Cardiology (Cardiology) Burna Sis, LCSW as Social Worker (Licensed Clinical Social Worker) Shamleffer, Konrad Dolores, MD as Consulting Physician (Endocrinology)  Patient Active Problem List   Diagnosis Date Noted   Chronic systolic heart failure (HCC)  40/98/1191   Type 2 diabetes mellitus with stage 3a chronic kidney disease, with long-term current use of insulin (HCC) 02/16/2022   Prosthetic joint implant failure, initial encounter (HCC) 12/20/2021   Pseudophakia of both eyes 11/15/2021   DVT (deep venous thrombosis) (HCC) 07/31/2021   Acute blood loss anemia    Septic Arthritis, Infection of prosthetic right knee joint (HCC), MSSA infection 07/10/2021   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) 07/10/2021   CAD (coronary artery disease) 06/08/2021   Carotid artery disease (HCC) 06/08/2021   Symptomatic bradycardia 06/08/2021   Paroxysmal atrial fibrillation (HCC) 06/08/2021   CKD (chronic kidney disease), stage III (HCC) 06/08/2021   Polyneuropathy associated with underlying disease (HCC) 05/04/2020   Severe nonproliferative diabetic retinopathy of left eye, with macular edema, associated with type 2 diabetes mellitus (HCC) 11/05/2019   Severe nonproliferative diabetic retinopathy of right eye, with macular edema, associated with type 2 diabetes mellitus (HCC) 11/05/2019   Retinal hemorrhage of right eye 11/05/2019   Retinal hemorrhage of left eye 11/05/2019   Diabetes mellitus (HCC) 05/14/2019   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, with long-term current use of insulin (HCC) 05/14/2019   Type 2 diabetes mellitus with diabetic polyneuropathy, with long-term current use of insulin (HCC) 05/14/2019   History of gout 05/20/2018   Primary localized osteoarthritis of right knee 05/13/2018   HFmrEF (heart failure with mildly  reduced EF) 12/20/2017   Obesity (BMI 30-39.9) 11/11/2017   OSA (obstructive sleep apnea) 02/18/2017   Essential hypertension    L Breast Cancer    Hypothyroidism     Current Outpatient Medications:    acetaminophen (TYLENOL) 500 MG tablet, Take 500-1,000 mg by mouth every 6 (six) hours as needed (for pain or headaches)., Disp: , Rfl:    allopurinol (ZYLOPRIM) 100 MG tablet, Take 100  mg by mouth daily., Disp: , Rfl:    apixaban (ELIQUIS) 5 MG TABS tablet, Take 1 tablet by mouth twice daily, Disp: 180 tablet, Rfl: 3   atorvastatin (LIPITOR) 80 MG tablet, Take 1 tablet (80 mg total) by mouth at bedtime., Disp: 100 tablet, Rfl: 3   B Complex-Biotin-FA (B-COMPLEX PO), Take 1 tablet by mouth daily., Disp: , Rfl:    Biotin 10 MG TABS, Take 10 mg by mouth daily., Disp: , Rfl:    Blood Glucose Monitoring Suppl (ONETOUCH VERIO) w/Device KIT, Use to check blood sugar, Disp: 1 kit, Rfl: 0   carvedilol (COREG) 3.125 MG tablet, Take 1 tablet (3.125 mg total) by mouth 2 (two) times daily with a meal., Disp: 180 tablet, Rfl: 3   Cinnamon 500 MG capsule, Take 500 mg by mouth daily., Disp: , Rfl:    clotrimazole-betamethasone (LOTRISONE) lotion, Apply 1 application topically 2 (two) times daily as needed (itching)., Disp: , Rfl:    Continuous Glucose Sensor (DEXCOM G6 SENSOR) MISC, 1 Device by Does not apply route as directed., Disp: 9 each, Rfl: 3   Continuous Glucose Transmitter (DEXCOM G6 TRANSMITTER) MISC, Change every 90 days, Disp: 1 each, Rfl: 3   empagliflozin (JARDIANCE) 10 MG TABS tablet, Take 1 tablet (10 mg total) by mouth daily., Disp: 30 tablet, Rfl: 11   ferrous sulfate 325 (65 FE) MG tablet, Take 1 tablet (325 mg total) by mouth 3 (three) times a week., Disp: 45 tablet, Rfl: 3   folic acid (FOLVITE) 1 MG tablet, Take 1 tablet by mouth once daily, Disp: 90 tablet, Rfl: 3   gabapentin (NEURONTIN) 300 MG capsule, Take 300 mg by mouth 2 (two) times daily., Disp: , Rfl:    glucose blood (ONETOUCH VERIO) test strip, Check blood sugar 2 times daily, Disp: 100 each, Rfl: 12   Insulin Glargine (BASAGLAR KWIKPEN) 100 UNIT/ML, Inject 40 Units into the skin at bedtime., Disp: 45 mL, Rfl: 2   insulin lispro (HUMALOG KWIKPEN) 100 UNIT/ML KwikPen, Take HUmalog 8 units with each meal PLUS Humalog correctional insulin scale max daily dose 50 units, Disp: 30 mL, Rfl: 3   Insulin Pen Needle 32G X  4 MM MISC, 1 Device by Does not apply route in the morning, at noon, in the evening, and at bedtime., Disp: 400 each, Rfl: 3   Lancets (ONETOUCH DELICA PLUS LANCET33G) MISC, USE   TO CHECK GLUCOSE 4 TIMES DAILY, Disp: 100 each, Rfl: 3   latanoprost (XALATAN) 0.005 % ophthalmic solution, Place 1 drop into both eyes at bedtime., Disp: , Rfl:    levothyroxine (SYNTHROID, LEVOTHROID) 75 MCG tablet, Take 75 mcg by mouth daily before breakfast., Disp: , Rfl:    LUMIGAN 0.01 % SOLN, Place 1 drop into both eyes at bedtime., Disp: , Rfl:    Multiple Vitamins-Minerals (ALIVE ONCE DAILY WOMENS 50+ PO), Take 1 tablet by mouth daily., Disp: , Rfl:    Omega-3 Fatty Acids (FISH OIL) 1200 MG CAPS, Take 1,200 mg by mouth at bedtime. Taking 2 capsules at night, Disp: , Rfl:  ondansetron (ZOFRAN) 4 MG tablet, Take 1 tablet (4 mg total) by mouth every 8 (eight) hours as needed for nausea or vomiting., Disp: 20 tablet, Rfl: 0   spironolactone (ALDACTONE) 25 MG tablet, Take 1 tablet (25 mg total) by mouth daily., Disp: 90 tablet, Rfl: 3   torsemide (DEMADEX) 20 MG tablet, Take 2 tablets (40 mg total) by mouth daily., Disp: 60 tablet, Rfl: 11   Travoprost, BAK Free, (TRAVATAN) 0.004 % SOLN ophthalmic solution, Place 1 drop into both eyes at bedtime., Disp: , Rfl:    insulin aspart (NOVOLOG FLEXPEN) 100 UNIT/ML FlexPen, DIAL AND INJECT UNDER THE SKIN THREE TIMES DAILY PER CORRECTION SCALE. MAX DAILY DOSE 50 UNITS (Patient not taking: Reported on 09/05/2023), Disp: 15 mL, Rfl: 0   Insulin Lispro (HUMALOG [place]), Inject 8 Units into the skin in the morning, at noon, and at bedtime. (Patient not taking: Reported on 08/08/2023), Disp: , Rfl:    methylPREDNISolone (MEDROL DOSEPAK) 4 MG TBPK tablet, Take as directed (Patient not taking: Reported on 07/25/2023), Disp: 21 tablet, Rfl: 0 Allergies  Allergen Reactions   Codeine Other (See Comments)   Other Other (See Comments)    Blood Product Refusal (Jehovah's witness)     Tape  Other (See Comments)    Leaves blisters and marks on the skin   Sulfa Antibiotics Rash   Uloric [Febuxostat] Rash     Social History   Socioeconomic History   Marital status: Widowed    Spouse name: Not on file   Number of children: 3   Years of education: Not on file   Highest education level: Some college, no degree  Occupational History   Occupation: retired  Tobacco Use   Smoking status: Former    Current packs/day: 0.00    Average packs/day: 1 pack/day for 22.0 years (22.0 ttl pk-yrs)    Types: Cigarettes    Start date: 2    Quit date: 5    Years since quitting: 42.3   Smokeless tobacco: Never  Vaping Use   Vaping status: Never Used  Substance and Sexual Activity   Alcohol use: Yes    Comment: 05/22/2018 "glass of wine couple times/wk"   Drug use: Not Currently   Sexual activity: Not Currently    Birth control/protection: None, Post-menopausal  Other Topics Concern   Not on file  Social History Narrative   Not on file   Social Drivers of Health   Financial Resource Strain: Medium Risk (12/22/2021)   Overall Financial Resource Strain (CARDIA)    Difficulty of Paying Living Expenses: Somewhat hard  Food Insecurity: No Food Insecurity (05/08/2018)   Hunger Vital Sign    Worried About Running Out of Food in the Last Year: Never true    Ran Out of Food in the Last Year: Never true  Transportation Needs: Unmet Transportation Needs (05/08/2022)   PRAPARE - Administrator, Civil Service (Medical): Yes    Lack of Transportation (Non-Medical): Yes  Physical Activity: Not on file  Stress: Not on file  Social Connections: Not on file  Intimate Partner Violence: Not on file    Physical Exam      Future Appointments  Date Time Provider Department Center  09/09/2023 11:15 AM Louann Sjogren, DPM [facility]  09/12/2023  7:15 AM [facility] DEVICE REMOTES [facility]  10/29/2023  3:30 PM [facility] None   12/12/2023  7:15 AM [facility] DEVICE REMOTES [facility]  03/12/2024  7:15 AM [facility] DEVICE REMOTES [facility]  [facility]  06/11/2024  7:15 AM [facility] DEVICE REMOTES [facility]  09/10/2024  7:15 AM [facility] DEVICE REMOTES [facility]  12/10/2024  7:15 AM [facility] DEVICE REMOTES [facility]  03/11/2025  7:15 AM [facility] DEVICE REMOTES [facility]  06/10/2025  7:15 AM [facility] DEVICE REMOTES [facility]  09/09/2025  7:15 AM [facility] DEVICE REMOTES [facility]

## 2023-09-05 NOTE — Progress Notes (Signed)

## 2023-09-06 NOTE — Telephone Encounter (Signed)
 Medication Samples have been provided to the patient.  Drug name: Eliquis       Strength: 5 mg        Qty: 6 boxes  LOT: [id number]  Exp.Date: 06/26  Dosing instructions: take 1 tablet Twice daily  Gave 6 boxes as patient is going away on vacation   The patient has been instructed regarding the correct time, dose, and frequency of taking this medication, including desired effects and most common side effects.   Jerrianne Hartin M Dinari Stgermaine 2:16 PM 09/06/2023

## 2023-09-09 ENCOUNTER — Ambulatory Visit (INDEPENDENT_AMBULATORY_CARE_PROVIDER_SITE_OTHER): Admitting: Podiatry

## 2023-09-09 DIAGNOSIS — L97521 Non-pressure chronic ulcer of other part of left foot limited to breakdown of skin: Secondary | ICD-10-CM | POA: Diagnosis not present

## 2023-09-09 DIAGNOSIS — E1151 Type 2 diabetes mellitus with diabetic peripheral angiopathy without gangrene: Secondary | ICD-10-CM

## 2023-09-09 NOTE — Progress Notes (Signed)
 Subjective:  Patient ID: Danielle [name], female    DOB: Nov 07, 1945,  MRN: [id number]  No chief complaint on file.   78 y.o. female returns for follow-up of left great toe wound.  Relates doing well. Aaron Aas Has been dressing the great toe as instructed. She is visiting [place]  for a month.      Review of Systems: Negative except as noted in the HPI. Denies N/V/F/Ch.  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HCC) 02/05/2017   Acute gout due to renal impairment involving right wrist 05/20/2018   Acute kidney failure (HCC) 05/15/2018   Arthritis    "all over" (05/22/2018)   Blood dyscrasia    per pt-has small blood cells-appears as if anemic   Breast cancer, left breast (HCC) 1985   CHF (congestive heart failure) (HCC)    Complication of anesthesia    difficult to awaken from per pt   Coronary artery disease    Dyspnea    Essential hypertension    Heart disease    Heart murmur    History of gout    Hypothyroidism    Obesity (BMI 30-39.9) 11/11/2017   OSA (obstructive sleep apnea) 02/18/2017    severe obstructive sleep apnea with an AHI of 75.6/h and mild central sleep apnea with a CAI of 7.7/h.  Oxygen saturations dropped as low as 82%.   He is on CPAP at 9 cm H2O.   OSA on CPAP    Personal history of chemotherapy    Personal history of radiation therapy    Presence of permanent cardiac pacemaker    Primary localized osteoarthritis of right knee 05/13/2018   Refusal of blood transfusions as patient is Jehovah's Witness    Type II diabetes mellitus (HCC)     Current Outpatient Medications:    acetaminophen (TYLENOL) 500 MG tablet, Take 500-1,000 mg by mouth every 6 (six) hours as needed (for pain or headaches)., Disp: , Rfl:    allopurinol (ZYLOPRIM) 100 MG tablet, Take 100 mg by mouth daily., Disp: , Rfl:    apixaban (ELIQUIS) 5 MG TABS tablet, Take 1 tablet by mouth twice daily, Disp: 180 tablet, Rfl: 3    atorvastatin (LIPITOR) 80 MG tablet, Take 1 tablet (80 mg total) by mouth at bedtime., Disp: 100 tablet, Rfl: 3   B Complex-Biotin-FA (B-COMPLEX PO), Take 1 tablet by mouth daily., Disp: , Rfl:    Biotin 10 MG TABS, Take 10 mg by mouth daily., Disp: , Rfl:    Blood Glucose Monitoring Suppl (ONETOUCH VERIO) w/Device KIT, Use to check blood sugar, Disp: 1 kit, Rfl: 0   carvedilol (COREG) 3.125 MG tablet, Take 1 tablet (3.125 mg total) by mouth 2 (two) times daily with a meal., Disp: 180 tablet, Rfl: 3   Cinnamon 500 MG capsule, Take 500 mg by mouth daily., Disp: , Rfl:    clotrimazole-betamethasone (LOTRISONE) lotion, Apply 1 application topically 2 (two) times daily as needed (itching)., Disp: , Rfl:    Continuous Glucose Sensor (DEXCOM G6 SENSOR) MISC, 1 Device by Does not apply route as directed., Disp: 9 each, Rfl: 3   Continuous Glucose Transmitter (DEXCOM G6 TRANSMITTER) MISC, Change every 90 days, Disp: 1 each, Rfl: 3   empagliflozin (JARDIANCE) 10 MG TABS tablet, Take 1 tablet (10 mg total) by mouth daily., Disp: 30 tablet, Rfl: 11   ferrous sulfate 325 (65 FE) MG tablet, Take 1 tablet (325 mg total) by mouth 3 (three)  times a week., Disp: 45 tablet, Rfl: 3   folic acid (FOLVITE) 1 MG tablet, Take 1 tablet by mouth once daily, Disp: 90 tablet, Rfl: 3   gabapentin (NEURONTIN) 300 MG capsule, Take 300 mg by mouth 2 (two) times daily., Disp: , Rfl:    glucose blood (ONETOUCH VERIO) test strip, Check blood sugar 2 times daily, Disp: 100 each, Rfl: 12   insulin aspart (NOVOLOG FLEXPEN) 100 UNIT/ML FlexPen, DIAL AND INJECT UNDER THE SKIN THREE TIMES DAILY PER CORRECTION SCALE. MAX DAILY DOSE 50 UNITS (Patient not taking: Reported on 09/05/2023), Disp: 15 mL, Rfl: 0   Insulin Glargine (BASAGLAR KWIKPEN) 100 UNIT/ML, Inject 40 Units into the skin at bedtime., Disp: 45 mL, Rfl: 2   insulin lispro (HUMALOG KWIKPEN) 100 UNIT/ML KwikPen, Take HUmalog 8 units with each meal PLUS Humalog correctional insulin  scale max daily dose 50 units, Disp: 30 mL, Rfl: 3   Insulin Lispro (HUMALOG [place]), Inject 8 Units into the skin in the morning, at noon, and at bedtime. (Patient not taking: Reported on 08/08/2023), Disp: , Rfl:    Insulin Pen Needle 32G X 4 MM MISC, 1 Device by Does not apply route in the morning, at noon, in the evening, and at bedtime., Disp: 400 each, Rfl: 3   Lancets (ONETOUCH DELICA PLUS LANCET33G) MISC, USE   TO CHECK GLUCOSE 4 TIMES DAILY, Disp: 100 each, Rfl: 3   latanoprost (XALATAN) 0.005 % ophthalmic solution, Place 1 drop into both eyes at bedtime., Disp: , Rfl:    levothyroxine (SYNTHROID, LEVOTHROID) 75 MCG tablet, Take 75 mcg by mouth daily before breakfast., Disp: , Rfl:    LUMIGAN 0.01 % SOLN, Place 1 drop into both eyes at bedtime., Disp: , Rfl:    methylPREDNISolone (MEDROL DOSEPAK) 4 MG TBPK tablet, Take as directed (Patient not taking: Reported on 07/25/2023), Disp: 21 tablet, Rfl: 0   Multiple Vitamins-Minerals (ALIVE ONCE DAILY WOMENS 50+ PO), Take 1 tablet by mouth daily., Disp: , Rfl:    Omega-3 Fatty Acids (FISH OIL) 1200 MG CAPS, Take 1,200 mg by mouth at bedtime. Taking 2 capsules at night, Disp: , Rfl:    ondansetron (ZOFRAN) 4 MG tablet, Take 1 tablet (4 mg total) by mouth every 8 (eight) hours as needed for nausea or vomiting., Disp: 20 tablet, Rfl: 0   spironolactone (ALDACTONE) 25 MG tablet, Take 1 tablet (25 mg total) by mouth daily., Disp: 90 tablet, Rfl: 3   torsemide (DEMADEX) 20 MG tablet, Take 2 tablets (40 mg total) by mouth daily., Disp: 60 tablet, Rfl: 11   Travoprost, BAK Free, (TRAVATAN) 0.004 % SOLN ophthalmic solution, Place 1 drop into both eyes at bedtime., Disp: , Rfl:   Social History   Tobacco Use  Smoking Status Former   Current packs/day: 0.00   Average packs/day: 1 pack/day for 22.0 years (22.0 ttl pk-yrs)   Types: Cigarettes   Start date: 86   Quit date: 16   Years since quitting: 42.3  Smokeless Tobacco Never    Allergies   Allergen Reactions   Codeine Other (See Comments)   Other Other (See Comments)    Blood Product Refusal (Jehovah's witness)     Tape Other (See Comments)    Leaves blisters and marks on the skin   Sulfa Antibiotics Rash   Uloric [Febuxostat] Rash   Objective:  Vascular: DP/PT pulses 2/4 bilateral. CFT <3 seconds. Absent hair growth on digits. Edema noted to bilateral lower extremities. Xerosis noted bilaterally.  Skin.  No lacerations or abrasions bilateral feet. Nails 1-5 bilateral  are thickened discolored and elongated with subungual debris. Left second digit amputation . Left hallux with hyperkeratosis noted and upon debridedment ulceration noted to plantar hallux with granular base. Nearly healed. No erythema edema or purulence noted. No probe to bone Musculoskeletal: MMT 5/5 bilateral lower extremities in DF, PF, Inversion and Eversion. Deceased ROM in DF of ankle joint. Left second digit amputation.  No tenderness erythema or edema noted to the great toe currently.  Neurological: Sensation intact to light touch. Protective sensation diminished bilateral.   Assessment:   1. Skin ulcer of left great toe, limited to breakdown of skin (HCC)   2. Type II diabetes mellitus with peripheral circulatory disorder [facility])           Plan:  Patient was evaluated and treated and all questions answered. Ulcer left hallux limited to skin breakdown.  Nearly healed.  -Debridement as below. -Dressed with betadine, DSD. -Off-loading with surgical shoe and offloading padding. Peg assist added to shoe to offload -Has DM shoes but advised to hold off until we get this healed.  -No abx indicated.  -Discussed glucose control and proper protein-rich diet.  -Discussed if any worsening redness, pain, fever or chills to call or may need to report to the emergency room. Patient expressed understanding.   Procedure: Excisional Debridement of Wound Rationale: Removal of non-viable soft tissue from the  wound to promote healing.  Anesthesia: none Pre-Debridement Wound Measurements: Overlying callus  Post-Debridement Wound Measurements: 0.4 cm x 0.3 cm x 0.1 cm  Type of Debridement: Sharp Excisional Tissue Removed: Non-viable soft tissue Depth of Debridement: subcutaneous tissue. Technique: Sharp excisional debridement to bleeding, viable wound base.  Dressing: Dry, sterile, compression dressing. Disposition: Patient tolerated procedure well. Patient to return in 4 week for follow-up. Patient is going out of town for a month and advised if any trouble to go to ED.   Return in about 4 weeks (around 10/07/2023) for wound check.   Return in about 4 weeks (around 10/07/2023) for wound check.

## 2023-09-11 ENCOUNTER — Telehealth: Payer: Self-pay | Admitting: Podiatry

## 2023-09-11 NOTE — Telephone Encounter (Signed)
 The patient called and left a message about bleeding in her toe and I  called back to leave a voice mail to get more information. Waiting on a response.

## 2023-09-12 NOTE — Telephone Encounter (Signed)
 She reports that she had some blood coming out of her left big toe yesterday that was a lot and and there is only very small amount of blood that came out today and she is wearing  a band aid only, no sign of infection. Please advise?

## 2023-09-12 NOTE — Telephone Encounter (Signed)
 My chart message sent as well

## 2023-09-12 NOTE — Telephone Encounter (Signed)
 I left a message on the voicemail with the providers recommendations and she voices understanding. She was asked to call back with questions after the holiday weekend.

## 2023-09-17 DIAGNOSIS — L2089 Other atopic dermatitis: Secondary | ICD-10-CM | POA: Diagnosis not present

## 2023-09-17 DIAGNOSIS — L218 Other seborrheic dermatitis: Secondary | ICD-10-CM | POA: Diagnosis not present

## 2023-09-18 ENCOUNTER — Institutional Professional Consult (permissible substitution) (INDEPENDENT_AMBULATORY_CARE_PROVIDER_SITE_OTHER): Payer: Medicare HMO

## 2023-10-01 ENCOUNTER — Telehealth (HOSPITAL_COMMUNITY): Payer: Self-pay

## 2023-10-01 NOTE — Telephone Encounter (Signed)
 Received a call from [place] who reports she is in [place] until the end of the month and says she is out of her insulin . She needs Basaglar  and Humalog  sent to the [facility] in [facility] on [address]. I will call Dr. Isadora Mar office for same.   Roberts Ching, EMT-Paramedic [phone] 10/01/2023

## 2023-10-03 ENCOUNTER — Telehealth: Payer: Self-pay | Admitting: *Deleted

## 2023-10-03 DIAGNOSIS — I5022 Chronic systolic (congestive) heart failure: Secondary | ICD-10-CM

## 2023-10-03 DIAGNOSIS — I10 Essential (primary) hypertension: Secondary | ICD-10-CM

## 2023-10-03 DIAGNOSIS — I2510 Atherosclerotic heart disease of native coronary artery without angina pectoris: Secondary | ICD-10-CM

## 2023-10-03 DIAGNOSIS — G4733 Obstructive sleep apnea (adult) (pediatric): Secondary | ICD-10-CM

## 2023-10-03 DIAGNOSIS — I480 Paroxysmal atrial fibrillation: Secondary | ICD-10-CM

## 2023-10-03 NOTE — Telephone Encounter (Signed)
 Per Dr Micael Adas, I have recommended sending her back to the sleep lab and get a CPAP titration prior to ordering a new device.  Titration DENIED - Can do a peer to peer.

## 2023-10-07 ENCOUNTER — Ambulatory Visit (INDEPENDENT_AMBULATORY_CARE_PROVIDER_SITE_OTHER): Admitting: Podiatry

## 2023-10-07 DIAGNOSIS — Z91199 Patient's noncompliance with other medical treatment and regimen due to unspecified reason: Secondary | ICD-10-CM

## 2023-10-07 NOTE — Progress Notes (Signed)
 No show

## 2023-10-15 ENCOUNTER — Ambulatory Visit (INDEPENDENT_AMBULATORY_CARE_PROVIDER_SITE_OTHER)

## 2023-10-15 ENCOUNTER — Other Ambulatory Visit (HOSPITAL_COMMUNITY): Payer: Self-pay

## 2023-10-15 DIAGNOSIS — I495 Sick sinus syndrome: Secondary | ICD-10-CM | POA: Diagnosis not present

## 2023-10-15 NOTE — Progress Notes (Signed)
 Paramedicine Encounter    Patient ID: Danielle [name], female    DOB: 11-03-1945, 78 y.o.   MRN: [id number]   Complaints- none   Assessment- CAOX4, warm and dry reporting to be feeling well with no complaints. Just returned home from month long trip to [place].   Compliance with meds- missed three doses of meds over the last week   Pill box filled- for one week   Refills needed- dexcom transmitter and atorvastatin    Meds changes since last visit- none     Social changes- wants to [facility] pharmacies from [facility]    VISIT SUMMARY- Arrived for home visit for [facility] who reports feeling fine with no complaints. She denied swelling, shortness of breath, chest pain or dizziness. She does report falling in the bathtub last week but not seeking further medical attention- she denied any injury but says she was just sore. I obtained vitals and assessment as noted. I reviewed meds and confirmed same- pill box filled for one week. She requested transfer to delivery [facility] and I assisted in getting meds moved. I reviewed upcoming visits and reminded her to reschedule her PCP and Foot and Ankle appointment. Dexcom transmitter is expired- I ordered a new one from new pharmacy. Should be delivered by Friday. CBG- 252 today with no insulin  taken yet. Diabetic management discussed and explained. Home visit complete. I will see her in one week.   There were no vitals taken for this visit. Weight yesterday-- didn't weigh  Last visit weight-- 200lbs      ACTION: Home visit completed     Patient Care Team: Merl Star, MD as PCP - General (Internal Medicine) Lei Pump, MD as PCP - Electrophysiology (Cardiology) Jacqueline Matsu, MD as PCP - Sleep Medicine (Cardiology) Bensimhon, Rheta Celestine, MD as PCP - Cardiology (Cardiology) Denton Flakes, LCSW as Social Worker (Licensed Clinical Social Worker) Shamleffer, Julian Obey, MD as Consulting  Physician (Endocrinology)  Patient Active Problem List   Diagnosis Date Noted   Chronic systolic heart failure (HCC) 05/08/2022   Type 2 diabetes mellitus with stage 3a chronic kidney disease, with long-term current use of insulin  (HCC) 02/16/2022   Prosthetic joint implant failure, initial encounter (HCC) 12/20/2021   Pseudophakia of both eyes 11/15/2021   DVT (deep venous thrombosis) (HCC) 07/31/2021   Acute blood loss anemia    Septic Arthritis, Infection of prosthetic right knee joint (HCC), MSSA infection 07/10/2021   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) 07/10/2021   CAD (coronary artery disease) 06/08/2021   Carotid artery disease (HCC) 06/08/2021   Symptomatic bradycardia 06/08/2021   Paroxysmal atrial fibrillation (HCC) 06/08/2021   CKD (chronic kidney disease), stage III (HCC) 06/08/2021   Polyneuropathy associated with underlying disease (HCC) 05/04/2020   Severe nonproliferative diabetic retinopathy of left eye, with macular edema, associated with type 2 diabetes mellitus (HCC) 11/05/2019   Severe nonproliferative diabetic retinopathy of right eye, with macular edema, associated with type 2 diabetes mellitus (HCC) 11/05/2019   Retinal hemorrhage of right eye 11/05/2019   Retinal hemorrhage of left eye 11/05/2019   Diabetes mellitus (HCC) 05/14/2019   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, with long-term current use of insulin  (HCC) 05/14/2019   Type 2 diabetes mellitus with diabetic polyneuropathy, with long-term current use of insulin  (HCC) 05/14/2019   History of gout 05/20/2018   Primary localized osteoarthritis of right knee 05/13/2018   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) 12/20/2017  Obesity (BMI 30-39.9) 11/11/2017   OSA (obstructive sleep apnea) 02/18/2017   Essential hypertension    L Breast Cancer    Hypothyroidism     Current Outpatient Medications:    acetaminophen  (TYLENOL ) 500 MG tablet, Take 500-1,000 mg by  mouth every 6 (six) hours as needed (for pain or headaches)., Disp: , Rfl:    allopurinol  (ZYLOPRIM ) 100 MG tablet, Take 100 mg by mouth daily., Disp: , Rfl:    apixaban  (ELIQUIS ) 5 MG TABS tablet, Take 1 tablet by mouth twice daily, Disp: 180 tablet, Rfl: 3   atorvastatin  (LIPITOR ) 80 MG tablet, Take 1 tablet (80 mg total) by mouth at bedtime., Disp: 100 tablet, Rfl: 3   B Complex-Biotin -FA (B-COMPLEX PO), Take 1 tablet by mouth daily., Disp: , Rfl:    Biotin  10 MG TABS, Take 10 mg by mouth daily., Disp: , Rfl:    Blood Glucose Monitoring Suppl (ONETOUCH VERIO) w/Device KIT, Use to check blood sugar, Disp: 1 kit, Rfl: 0   carvedilol  (COREG ) 3.125 MG tablet, Take 1 tablet (3.125 mg total) by mouth 2 (two) times daily with a meal., Disp: 180 tablet, Rfl: 3   Cinnamon 500 MG capsule, Take 500 mg by mouth daily., Disp: , Rfl:    clotrimazole-betamethasone (LOTRISONE) lotion, Apply 1 application topically 2 (two) times daily as needed (itching)., Disp: , Rfl:    Continuous Glucose Sensor (DEXCOM G6 SENSOR) MISC, 1 Device by Does not apply route as directed., Disp: 9 each, Rfl: 3   Continuous Glucose Transmitter (DEXCOM G6 TRANSMITTER) MISC, Change every 90 days, Disp: 1 each, Rfl: 3   empagliflozin  (JARDIANCE ) 10 MG TABS tablet, Take 1 tablet (10 mg total) by mouth daily., Disp: 30 tablet, Rfl: 11   ferrous sulfate  325 (65 FE) MG tablet, Take 1 tablet (325 mg total) by mouth 3 (three) times a week., Disp: 45 tablet, Rfl: 3   folic acid  (FOLVITE ) 1 MG tablet, Take 1 tablet by mouth once daily, Disp: 90 tablet, Rfl: 3   gabapentin  (NEURONTIN ) 300 MG capsule, Take 300 mg by mouth 2 (two) times daily., Disp: , Rfl:    glucose blood (ONETOUCH VERIO) test strip, Check blood sugar 2 times daily, Disp: 100 each, Rfl: 12   insulin  aspart (NOVOLOG  FLEXPEN) 100 UNIT/ML FlexPen, DIAL  AND INJECT UNDER THE SKIN THREE TIMES DAILY PER CORRECTION SCALE. MAX DAILY DOSE 50 UNITS, Disp: 15 mL, Rfl: 0   Insulin  Glargine  (BASAGLAR  KWIKPEN) 100 UNIT/ML, Inject 40 Units into the skin at bedtime., Disp: 45 mL, Rfl: 2   Insulin  Pen Needle 32G X 4 MM MISC, 1 Device by Does not apply route in the morning, at noon, in the evening, and at bedtime., Disp: 400 each, Rfl: 3   Lancets (ONETOUCH DELICA PLUS LANCET33G) MISC, USE   TO CHECK GLUCOSE 4 TIMES DAILY, Disp: 100 each, Rfl: 3   latanoprost  (XALATAN ) 0.005 % ophthalmic solution, Place 1 drop into both eyes at bedtime., Disp: , Rfl:    levothyroxine  (SYNTHROID , LEVOTHROID) 75 MCG tablet, Take 75 mcg by mouth daily before breakfast., Disp: , Rfl:    LUMIGAN 0.01 % SOLN, Place 1 drop into both eyes at bedtime., Disp: , Rfl:    Multiple Vitamins-Minerals (ALIVE ONCE DAILY WOMENS 50+ PO), Take 1 tablet by mouth daily., Disp: , Rfl:    Omega-3 Fatty Acids (FISH OIL) 1200 MG CAPS, Take 1,200 mg by mouth at bedtime. Taking 2 capsules at night, Disp: , Rfl:    ondansetron  (ZOFRAN ) 4  MG tablet, Take 1 tablet (4 mg total) by mouth every 8 (eight) hours as needed for nausea or vomiting., Disp: 20 tablet, Rfl: 0   spironolactone  (ALDACTONE ) 25 MG tablet, Take 1 tablet (25 mg total) by mouth daily., Disp: 90 tablet, Rfl: 3   torsemide  (DEMADEX ) 20 MG tablet, Take 2 tablets (40 mg total) by mouth daily., Disp: 60 tablet, Rfl: 11   Travoprost, BAK Free, (TRAVATAN) 0.004 % SOLN ophthalmic solution, Place 1 drop into both eyes at bedtime., Disp: , Rfl:    insulin  lispro (HUMALOG  KWIKPEN) 100 UNIT/ML KwikPen, Take HUmalog  8 units with each meal PLUS Humalog  correctional insulin  scale max daily dose 50 units (Patient not taking: Reported on 10/15/2023), Disp: 30 mL, Rfl: 3   Insulin  Lispro (HUMALOG  [place]), Inject 8 Units into the skin in the morning, at noon, and at bedtime. (Patient not taking: Reported on 10/15/2023), Disp: , Rfl:    methylPREDNISolone  (MEDROL  DOSEPAK) 4 MG TBPK tablet, Take as directed (Patient not taking: Reported on 10/15/2023), Disp: 21 tablet, Rfl: 0 Allergies  Allergen  Reactions   Codeine Other (See Comments)   Other Other (See Comments)    Blood Product Refusal (Jehovah's witness)     Tape Other (See Comments)    Leaves blisters and marks on the skin   Sulfa Antibiotics Rash   Uloric  [Febuxostat ] Rash     Social History   Socioeconomic History   Marital status: Widowed    Spouse name: Not on file   Number of children: 3   Years of education: Not on file   Highest education level: Some college, no degree  Occupational History   Occupation: retired  Tobacco Use   Smoking status: Former    Current packs/day: 0.00    Average packs/day: 1 pack/day for 22.0 years (22.0 ttl pk-yrs)    Types: Cigarettes    Start date: 6    Quit date: 22    Years since quitting: 42.4   Smokeless tobacco: Never  Vaping Use   Vaping status: Never Used  Substance and Sexual Activity   Alcohol use: Yes    Comment: 05/22/2018 "glass of wine couple times/wk"   Drug use: Not Currently   Sexual activity: Not Currently    Birth control/protection: None, Post-menopausal  Other Topics Concern   Not on file  Social History Narrative   Not on file   Social Drivers of Health   Financial Resource Strain: Medium Risk (12/22/2021)   Overall Financial Resource Strain (CARDIA)    Difficulty of Paying Living Expenses: Somewhat hard  Food Insecurity: No Food Insecurity (05/08/2018)   Hunger Vital Sign    Worried About Running Out of Food in the Last Year: Never true    Ran Out of Food in the Last Year: Never true  Transportation Needs: Unmet Transportation Needs (05/08/2022)   PRAPARE - Administrator, Civil Service (Medical): Yes    Lack of Transportation (Non-Medical): Yes  Physical Activity: Not on file  Stress: Not on file  Social Connections: Not on file  Intimate Partner Violence: Not on file    Physical Exam      Future Appointments  Date Time Provider Department Center  10/29/2023  3:30 PM Evelina Hippo, MD [facility] None  11/07/2023   3:30 PM [facility] PA/NP [facility] None  12/12/2023  7:15 AM [facility] DEVICE REMOTES [facility]  03/12/2024  7:15 AM [facility] DEVICE REMOTES [facility]  06/11/2024  7:15 AM [facility] DEVICE  REMOTES [facility]  09/10/2024  7:15 AM [facility] DEVICE REMOTES [facility]  12/10/2024  7:15 AM [facility] DEVICE REMOTES [facility]  03/11/2025  7:15 AM [facility] DEVICE REMOTES [facility]  06/10/2025  7:15 AM [facility] DEVICE REMOTES [facility]  09/09/2025  7:15 AM [facility] DEVICE REMOTES [facility]

## 2023-10-16 LAB — CUP PACEART REMOTE DEVICE CHECK
Battery Remaining Longevity: 32 mo
Battery Voltage: 2.93 V
Brady Statistic AP VP Percent: 0.01 %
Brady Statistic AP VS Percent: 2.9 %
Brady Statistic AS VP Percent: 0.06 %
Brady Statistic AS VS Percent: 97.03 %
Brady Statistic RA Percent Paced: 2.9 %
Brady Statistic RV Percent Paced: 0.07 %
Date Time Interrogation Session: 20250520133222
Implantable Lead Connection Status: 753985
Implantable Lead Connection Status: 753985
Implantable Lead Implant Date: 20160222
Implantable Lead Implant Date: 20160222
Implantable Lead Location: 753859
Implantable Lead Location: 753860
Implantable Lead Model: 5076
Implantable Lead Model: 5076
Implantable Pulse Generator Implant Date: 20160222
Lead Channel Impedance Value: 361 Ohm
Lead Channel Impedance Value: 361 Ohm
Lead Channel Impedance Value: 399 Ohm
Lead Channel Impedance Value: 456 Ohm
Lead Channel Pacing Threshold Amplitude: 0.75 V
Lead Channel Pacing Threshold Amplitude: 0.75 V
Lead Channel Pacing Threshold Pulse Width: 0.4 ms
Lead Channel Pacing Threshold Pulse Width: 0.4 ms
Lead Channel Sensing Intrinsic Amplitude: 15.875 mV
Lead Channel Sensing Intrinsic Amplitude: 15.875 mV
Lead Channel Sensing Intrinsic Amplitude: 3.125 mV
Lead Channel Sensing Intrinsic Amplitude: 3.125 mV
Lead Channel Setting Pacing Amplitude: 2 V
Lead Channel Setting Pacing Amplitude: 2 V
Lead Channel Setting Pacing Pulse Width: 0.4 ms
Lead Channel Setting Sensing Sensitivity: 2 mV
Zone Setting Status: 755011
Zone Setting Status: 755011

## 2023-10-17 ENCOUNTER — Other Ambulatory Visit (HOSPITAL_COMMUNITY): Payer: Self-pay | Admitting: Cardiology

## 2023-10-17 ENCOUNTER — Ambulatory Visit: Payer: Self-pay | Admitting: Cardiology

## 2023-10-17 MED ORDER — CARVEDILOL 3.125 MG PO TABS: 3.1250 mg | ORAL_TABLET | Freq: Two times a day (BID) | ORAL | 3 refills | Status: AC

## 2023-10-17 MED ORDER — TORSEMIDE 20 MG PO TABS: 40.0000 mg | ORAL_TABLET | Freq: Every day | ORAL | 3 refills | Status: DC

## 2023-10-17 MED ORDER — APIXABAN 5 MG PO TABS: 5.0000 mg | ORAL_TABLET | Freq: Two times a day (BID) | ORAL | 3 refills | Status: AC

## 2023-10-17 MED ORDER — ATORVASTATIN CALCIUM 80 MG PO TABS: 80.0000 mg | ORAL_TABLET | Freq: Every day | ORAL | 3 refills | Status: AC

## 2023-10-17 MED ORDER — EMPAGLIFLOZIN 10 MG PO TABS: 10.0000 mg | ORAL_TABLET | Freq: Every day | ORAL | 3 refills | Status: AC

## 2023-10-17 MED ORDER — SPIRONOLACTONE 25 MG PO TABS: 25.0000 mg | ORAL_TABLET | Freq: Every day | ORAL | 3 refills | Status: AC

## 2023-10-22 ENCOUNTER — Other Ambulatory Visit (HOSPITAL_COMMUNITY): Payer: Self-pay | Admitting: Internal Medicine

## 2023-10-22 DIAGNOSIS — I739 Peripheral vascular disease, unspecified: Secondary | ICD-10-CM | POA: Diagnosis not present

## 2023-10-22 DIAGNOSIS — E039 Hypothyroidism, unspecified: Secondary | ICD-10-CM | POA: Diagnosis not present

## 2023-10-22 DIAGNOSIS — R519 Headache, unspecified: Secondary | ICD-10-CM

## 2023-10-22 DIAGNOSIS — I480 Paroxysmal atrial fibrillation: Secondary | ICD-10-CM | POA: Diagnosis not present

## 2023-10-23 ENCOUNTER — Other Ambulatory Visit (HOSPITAL_COMMUNITY): Payer: Self-pay

## 2023-10-23 ENCOUNTER — Telehealth (HOSPITAL_COMMUNITY): Payer: Self-pay

## 2023-10-23 DIAGNOSIS — H401133 Primary open-angle glaucoma, bilateral, severe stage: Secondary | ICD-10-CM | POA: Diagnosis not present

## 2023-10-23 DIAGNOSIS — Z961 Presence of intraocular lens: Secondary | ICD-10-CM | POA: Diagnosis not present

## 2023-10-23 DIAGNOSIS — E113212 Type 2 diabetes mellitus with mild nonproliferative diabetic retinopathy with macular edema, left eye: Secondary | ICD-10-CM | POA: Diagnosis not present

## 2023-10-23 DIAGNOSIS — E113291 Type 2 diabetes mellitus with mild nonproliferative diabetic retinopathy without macular edema, right eye: Secondary | ICD-10-CM | POA: Diagnosis not present

## 2023-10-23 NOTE — Telephone Encounter (Signed)
 HF Paramedicine Message to [facility]  Pharmacy (if applicable): [facility]    Issue/reason for call:  Pharmacy reports they have not received two prescriptions.  -folic acid  -ferrous sulfate    These have been filled by Dr. Julane Ny.   Roberts Ching, EMT-Paramedic [phone] 10/23/2023

## 2023-10-23 NOTE — Telephone Encounter (Signed)
 I had a home visit today with Danielle [name] and she showed me the letter she received from insurance explaining denial for CPAP- it says that they are needing clinical records for medical necessity.    Their phone number- [phone]  Their Fax- [phone]   I am going to print an appeal form and mail it in for her.   I will forward to Dr. Micael Adas to update her for same.

## 2023-10-23 NOTE — Progress Notes (Signed)
 Paramedicine Encounter    Patient ID: Danielle [name], female    DOB: 1945/10/22, 78 y.o.   MRN: [id number]   Complaints- right big toe pain, hearing problems with right ear pain will be seeing [facility] this upcoming week.   Assessment- CAOX4, warm and dry ambulatory with no shortness of breath, no chest pain, no swelling, no dizziness. She reports a headache since falling in the bathtub since two weeks ago, PCP ordered an MRI. That will be this Friday.   Compliance with meds- missed a few doses of meds over the last week- mostly thyroid  pill   Pill box filled- for one week   Refills needed- gabapentin , levothyroxine    Meds changes since last visit- none     Social changes- none    VISIT SUMMARY- Arrived for home visit for [facility] who reports to be feeling okay but having a consistent headache since her fall a few weeks ago- PCP ordered a head CT for Friday. She is having no dizziness no balance issues, no nose bleeds or gait/vision problems. She complains of right big toe pain- she sees foot dr next week. Vitals obtained as noted. Assessment as noted. No lower leg swelling, weight down. She had been constipated until yesterday and had 5 massive BM's she said she is feeling much better. I reviewed meds and filled pill box for one week. Prescriptions to be sent to [facility] requested. She is requesting physical therapy I advised her to call her PCP.  Future appointments confirmed and wrote down. Home visit complete for one week. I will see her in one week.   BP 118/66   Pulse 80   Resp 16   Wt 191 lb (86.6 kg)   SpO2 95%   BMI 32.79 kg/m  Weight yesterday-- 201lbs  Last visit weight-- 204lbs    Dexcom placed at 1200.   ACTION: Home visit completed     Patient Care Team: Merl Star, MD as PCP - General (Internal Medicine) Lei Pump, MD as PCP - Electrophysiology (Cardiology) Jacqueline Matsu, MD as PCP - Sleep Medicine  (Cardiology) Bensimhon, Rheta Celestine, MD as PCP - Cardiology (Cardiology) Denton Flakes, LCSW as Social Worker (Licensed Clinical Social Worker) Shamleffer, Julian Obey, MD as Consulting Physician (Endocrinology)  Patient Active Problem List   Diagnosis Date Noted   Chronic systolic heart failure (HCC) 05/08/2022   Type 2 diabetes mellitus with stage 3a chronic kidney disease, with long-term current use of insulin  (HCC) 02/16/2022   Prosthetic joint implant failure, initial encounter (HCC) 12/20/2021   Pseudophakia of both eyes 11/15/2021   DVT (deep venous thrombosis) (HCC) 07/31/2021   Acute blood loss anemia    Septic Arthritis, Infection of prosthetic right knee joint (HCC), MSSA infection 07/10/2021   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) 07/10/2021   CAD (coronary artery disease) 06/08/2021   Carotid artery disease (HCC) 06/08/2021   Symptomatic bradycardia 06/08/2021   Paroxysmal atrial fibrillation (HCC) 06/08/2021   CKD (chronic kidney disease), stage III (HCC) 06/08/2021   Polyneuropathy associated with underlying disease (HCC) 05/04/2020   Severe nonproliferative diabetic retinopathy of left eye, with macular edema, associated with type 2 diabetes mellitus (HCC) 11/05/2019   Severe nonproliferative diabetic retinopathy of right eye, with macular edema, associated with type 2 diabetes mellitus (HCC) 11/05/2019   Retinal hemorrhage of right eye 11/05/2019   Retinal hemorrhage of left eye 11/05/2019   Diabetes mellitus (HCC) 05/14/2019   Type 2 diabetes mellitus with  proliferative retinopathy, with long-term current use of insulin  (HCC) 05/14/2019   Type 2 diabetes mellitus with diabetic polyneuropathy, with long-term current use of insulin  (HCC) 05/14/2019   History of gout 05/20/2018   Primary localized osteoarthritis of right knee 05/13/2018   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) 12/20/2017   Obesity (BMI 30-39.9) 11/11/2017   OSA (obstructive sleep  apnea) 02/18/2017   Essential hypertension    L Breast Cancer    Hypothyroidism     Current Outpatient Medications:    acetaminophen  (TYLENOL ) 500 MG tablet, Take 500-1,000 mg by mouth every 6 (six) hours as needed (for pain or headaches)., Disp: , Rfl:    allopurinol  (ZYLOPRIM ) 100 MG tablet, Take 100 mg by mouth daily., Disp: , Rfl:    apixaban  (ELIQUIS ) 5 MG TABS tablet, Take 1 tablet (5 mg total) by mouth 2 (two) times daily., Disp: 180 tablet, Rfl: 3   atorvastatin  (LIPITOR ) 80 MG tablet, Take 1 tablet (80 mg total) by mouth at bedtime., Disp: 100 tablet, Rfl: 3   B Complex-Biotin -FA (B-COMPLEX PO), Take 1 tablet by mouth daily., Disp: , Rfl:    Biotin  10 MG TABS, Take 10 mg by mouth daily., Disp: , Rfl:    Blood Glucose Monitoring Suppl (ONETOUCH VERIO) w/Device KIT, Use to check blood sugar, Disp: 1 kit, Rfl: 0   carvedilol  (COREG ) 3.125 MG tablet, Take 1 tablet (3.125 mg total) by mouth 2 (two) times daily with a meal., Disp: 180 tablet, Rfl: 3   Cinnamon 500 MG capsule, Take 500 mg by mouth daily., Disp: , Rfl:    clotrimazole-betamethasone (LOTRISONE) lotion, Apply 1 application topically 2 (two) times daily as needed (itching)., Disp: , Rfl:    Continuous Glucose Sensor (DEXCOM G6 SENSOR) MISC, 1 Device by Does not apply route as directed., Disp: 9 each, Rfl: 3   Continuous Glucose Transmitter (DEXCOM G6 TRANSMITTER) MISC, Change every 90 days, Disp: 1 each, Rfl: 3   empagliflozin  (JARDIANCE ) 10 MG TABS tablet, Take 1 tablet (10 mg total) by mouth daily., Disp: 90 tablet, Rfl: 3   ferrous sulfate  325 (65 FE) MG tablet, Take 1 tablet (325 mg total) by mouth 3 (three) times a week., Disp: 45 tablet, Rfl: 3   folic acid  (FOLVITE ) 1 MG tablet, Take 1 tablet by mouth once daily, Disp: 90 tablet, Rfl: 3   gabapentin  (NEURONTIN ) 300 MG capsule, Take 300 mg by mouth 2 (two) times daily., Disp: , Rfl:    glucose blood (ONETOUCH VERIO) test strip, Check blood sugar 2 times daily, Disp: 100  each, Rfl: 12   insulin  aspart (NOVOLOG  FLEXPEN) 100 UNIT/ML FlexPen, DIAL  AND INJECT UNDER THE SKIN THREE TIMES DAILY PER CORRECTION SCALE. MAX DAILY DOSE 50 UNITS, Disp: 15 mL, Rfl: 0   Insulin  Glargine (BASAGLAR  KWIKPEN) 100 UNIT/ML, Inject 40 Units into the skin at bedtime., Disp: 45 mL, Rfl: 2   Insulin  Pen Needle 32G X 4 MM MISC, 1 Device by Does not apply route in the morning, at noon, in the evening, and at bedtime., Disp: 400 each, Rfl: 3   Lancets (ONETOUCH DELICA PLUS LANCET33G) MISC, USE   TO CHECK GLUCOSE 4 TIMES DAILY, Disp: 100 each, Rfl: 3   latanoprost  (XALATAN ) 0.005 % ophthalmic solution, Place 1 drop into both eyes at bedtime., Disp: , Rfl:    levothyroxine  (SYNTHROID , LEVOTHROID) 75 MCG tablet, Take 75 mcg by mouth daily before breakfast., Disp: , Rfl:    LUMIGAN 0.01 % SOLN, Place 1 drop into  both eyes at bedtime., Disp: , Rfl:    Multiple Vitamins-Minerals (ALIVE ONCE DAILY WOMENS 50+ PO), Take 1 tablet by mouth daily., Disp: , Rfl:    Omega-3 Fatty Acids (FISH OIL) 1200 MG CAPS, Take 1,200 mg by mouth at bedtime. Taking 2 capsules at night, Disp: , Rfl:    ondansetron  (ZOFRAN ) 4 MG tablet, Take 1 tablet (4 mg total) by mouth every 8 (eight) hours as needed for nausea or vomiting., Disp: 20 tablet, Rfl: 0   spironolactone  (ALDACTONE ) 25 MG tablet, Take 1 tablet (25 mg total) by mouth daily., Disp: 90 tablet, Rfl: 3   torsemide  (DEMADEX ) 20 MG tablet, Take 2 tablets (40 mg total) by mouth daily., Disp: 180 tablet, Rfl: 3   Travoprost, BAK Free, (TRAVATAN) 0.004 % SOLN ophthalmic solution, Place 1 drop into both eyes at bedtime., Disp: , Rfl:    insulin  lispro (HUMALOG  KWIKPEN) 100 UNIT/ML KwikPen, Take HUmalog  8 units with each meal PLUS Humalog  correctional insulin  scale max daily dose 50 units (Patient not taking: Reported on 10/23/2023), Disp: 30 mL, Rfl: 3   Insulin  Lispro (HUMALOG  [place]), Inject 8 Units into the skin in the morning, at noon, and at bedtime. (Patient not taking:  Reported on 08/08/2023), Disp: , Rfl:    methylPREDNISolone  (MEDROL  DOSEPAK) 4 MG TBPK tablet, Take as directed (Patient not taking: Reported on 07/25/2023), Disp: 21 tablet, Rfl: 0 Allergies  Allergen Reactions   Codeine Other (See Comments)   Other Other (See Comments)    Blood Product Refusal (Jehovah's witness)     Tape Other (See Comments)    Leaves blisters and marks on the skin   Sulfa Antibiotics Rash   Uloric  [Febuxostat ] Rash     Social History   Socioeconomic History   Marital status: Widowed    Spouse name: Not on file   Number of children: 3   Years of education: Not on file   Highest education level: Some college, no degree  Occupational History   Occupation: retired  Tobacco Use   Smoking status: Former    Current packs/day: 0.00    Average packs/day: 1 pack/day for 22.0 years (22.0 ttl pk-yrs)    Types: Cigarettes    Start date: 81    Quit date: 1983    Years since quitting: 42.4   Smokeless tobacco: Never  Vaping Use   Vaping status: Never Used  Substance and Sexual Activity   Alcohol use: Yes    Comment: 05/22/2018 "glass of wine couple times/wk"   Drug use: Not Currently   Sexual activity: Not Currently    Birth control/protection: None, Post-menopausal  Other Topics Concern   Not on file  Social History Narrative   Not on file   Social Drivers of Health   Financial Resource Strain: Medium Risk (12/22/2021)   Overall Financial Resource Strain (CARDIA)    Difficulty of Paying Living Expenses: Somewhat hard  Food Insecurity: No Food Insecurity (05/08/2018)   Hunger Vital Sign    Worried About Running Out of Food in the Last Year: Never true    Ran Out of Food in the Last Year: Never true  Transportation Needs: Unmet Transportation Needs (05/08/2022)   PRAPARE - Administrator, Civil Service (Medical): Yes    Lack of Transportation (Non-Medical): Yes  Physical Activity: Not on file  Stress: Not on file  Social Connections: Not  on file  Intimate Partner Violence: Not on file    Physical Exam  Future Appointments  Date Time Provider Department Center  10/25/2023  1:30 PM [facility] 1 [facility] [place]  10/29/2023  3:30 PM Evelina Hippo, MD [facility] None  10/30/2023  8:15 AM Jennefer Moats, DPM [facility]  11/07/2023  3:30 PM [facility] PA/NP [facility] None  01/14/2024  7:05 AM [facility] DEVICE REMOTES [facility]  04/14/2024  7:05 AM [facility] DEVICE REMOTES [facility]  07/14/2024  7:05 AM [facility] DEVICE REMOTES [facility]  10/13/2024  7:05 AM [facility] DEVICE REMOTES [facility]  01/12/2025  7:05 AM [facility] DEVICE REMOTES [facility]  04/13/2025  7:05 AM [facility] DEVICE REMOTES [facility]  07/13/2025  7:05 AM [facility] DEVICE REMOTES [facility]

## 2023-10-24 NOTE — Telephone Encounter (Addendum)
 Clinicals faxed to [facility] [employer].

## 2023-10-25 ENCOUNTER — Ambulatory Visit (HOSPITAL_COMMUNITY): Admission: RE | Admit: 2023-10-25 | Source: Ambulatory Visit

## 2023-10-25 ENCOUNTER — Encounter (HOSPITAL_COMMUNITY): Payer: Self-pay

## 2023-10-28 ENCOUNTER — Encounter (INDEPENDENT_AMBULATORY_CARE_PROVIDER_SITE_OTHER): Payer: Self-pay

## 2023-10-28 DIAGNOSIS — E1165 Type 2 diabetes mellitus with hyperglycemia: Secondary | ICD-10-CM | POA: Diagnosis not present

## 2023-10-29 ENCOUNTER — Institutional Professional Consult (permissible substitution) (INDEPENDENT_AMBULATORY_CARE_PROVIDER_SITE_OTHER): Admitting: Otolaryngology

## 2023-10-29 ENCOUNTER — Encounter (INDEPENDENT_AMBULATORY_CARE_PROVIDER_SITE_OTHER): Payer: Self-pay

## 2023-10-30 ENCOUNTER — Ambulatory Visit: Admitting: Podiatry

## 2023-10-30 ENCOUNTER — Encounter: Payer: Self-pay | Admitting: Podiatry

## 2023-10-30 DIAGNOSIS — L97522 Non-pressure chronic ulcer of other part of left foot with fat layer exposed: Secondary | ICD-10-CM | POA: Diagnosis not present

## 2023-10-30 DIAGNOSIS — E1151 Type 2 diabetes mellitus with diabetic peripheral angiopathy without gangrene: Secondary | ICD-10-CM | POA: Diagnosis not present

## 2023-10-30 NOTE — Progress Notes (Addendum)
 Subjective:  Patient ID: Danielle [name], female    DOB: July 27, 1945,  MRN: [id number]  Chief Complaint  Patient presents with   Wound Check    RM 22 Patient is here for wound check. Ulcer on left hallux. Pt. States not pain, keeps wound washed an unwrapped due to odor development. No drainage or swelling, wound is scabbed over.  Pt .uses silvadene everyday for wound care.    78 y.o. female returns for follow-up of left great toe wound.  Has been out of town and not been seen in a while. Relates doing ok. Aaron Aas Has been dressing the great toe as instructed.   Review of Systems: Negative except as noted in the HPI. Denies N/V/F/Ch.  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HCC) 02/05/2017   Acute gout due to renal impairment involving right wrist 05/20/2018   Acute kidney failure (HCC) 05/15/2018   Arthritis    "all over" (05/22/2018)   Blood dyscrasia    per pt-has small blood cells-appears as if anemic   Breast cancer, left breast (HCC) 1985   CHF (congestive heart failure) (HCC)    Complication of anesthesia    difficult to awaken from per pt   Coronary artery disease    Dyspnea    Essential hypertension    Heart disease    Heart murmur    History of gout    Hypothyroidism    Obesity (BMI 30-39.9) 11/11/2017   OSA (obstructive sleep apnea) 02/18/2017    severe obstructive sleep apnea with an AHI of 75.6/h and mild central sleep apnea with a CAI of 7.7/h.  Oxygen saturations dropped as low as 82%.   He is on CPAP at 9 cm H2O.   OSA on CPAP    Personal history of chemotherapy    Personal history of radiation therapy    Presence of permanent cardiac pacemaker    Primary localized osteoarthritis of right knee 05/13/2018   Refusal of blood transfusions as patient is Jehovah's Witness    Type II diabetes mellitus (HCC)     Current Outpatient Medications:    acetaminophen  (TYLENOL ) 500 MG tablet, Take 500-1,000 mg by  mouth every 6 (six) hours as needed (for pain or headaches)., Disp: , Rfl:    allopurinol  (ZYLOPRIM ) 100 MG tablet, Take 100 mg by mouth daily., Disp: , Rfl:    apixaban  (ELIQUIS ) 5 MG TABS tablet, Take 1 tablet (5 mg total) by mouth 2 (two) times daily., Disp: 180 tablet, Rfl: 3   atorvastatin  (LIPITOR ) 80 MG tablet, Take 1 tablet (80 mg total) by mouth at bedtime., Disp: 100 tablet, Rfl: 3   B Complex-Biotin -FA (B-COMPLEX PO), Take 1 tablet by mouth daily., Disp: , Rfl:    Biotin  10 MG TABS, Take 10 mg by mouth daily., Disp: , Rfl:    Blood Glucose Monitoring Suppl (ONETOUCH VERIO) w/Device KIT, Use to check blood sugar, Disp: 1 kit, Rfl: 0   carvedilol  (COREG ) 3.125 MG tablet, Take 1 tablet (3.125 mg total) by mouth 2 (two) times daily with a meal., Disp: 180 tablet, Rfl: 3   Cinnamon 500 MG capsule, Take 500 mg by mouth daily., Disp: , Rfl:    clotrimazole-betamethasone (LOTRISONE) lotion, Apply 1 application topically 2 (two) times daily as needed (itching)., Disp: , Rfl:    Continuous Glucose Sensor (DEXCOM G6 SENSOR) MISC, 1 Device by Does not apply route as directed., Disp: 9 each, Rfl: 3   Continuous  Glucose Transmitter (DEXCOM G6 TRANSMITTER) MISC, Change every 90 days, Disp: 1 each, Rfl: 3   empagliflozin  (JARDIANCE ) 10 MG TABS tablet, Take 1 tablet (10 mg total) by mouth daily., Disp: 90 tablet, Rfl: 3   ferrous sulfate  325 (65 FE) MG tablet, Take 1 tablet (325 mg total) by mouth 3 (three) times a week., Disp: 45 tablet, Rfl: 3   folic acid  (FOLVITE ) 1 MG tablet, Take 1 tablet by mouth once daily, Disp: 90 tablet, Rfl: 3   gabapentin  (NEURONTIN ) 300 MG capsule, Take 300 mg by mouth 2 (two) times daily., Disp: , Rfl:    glucose blood (ONETOUCH VERIO) test strip, Check blood sugar 2 times daily, Disp: 100 each, Rfl: 12   insulin  aspart (NOVOLOG  FLEXPEN) 100 UNIT/ML FlexPen, DIAL  AND INJECT UNDER THE SKIN THREE TIMES DAILY PER CORRECTION SCALE. MAX DAILY DOSE 50 UNITS, Disp: 15 mL, Rfl: 0    Insulin  Glargine (BASAGLAR  KWIKPEN) 100 UNIT/ML, Inject 40 Units into the skin at bedtime., Disp: 45 mL, Rfl: 2   insulin  lispro (HUMALOG  KWIKPEN) 100 UNIT/ML KwikPen, Take HUmalog  8 units with each meal PLUS Humalog  correctional insulin  scale max daily dose 50 units, Disp: 30 mL, Rfl: 3   Insulin  Lispro (HUMALOG  [place]), Inject 8 Units into the skin in the morning, at noon, and at bedtime., Disp: , Rfl:    Insulin  Pen Needle 32G X 4 MM MISC, 1 Device by Does not apply route in the morning, at noon, in the evening, and at bedtime., Disp: 400 each, Rfl: 3   Lancets (ONETOUCH DELICA PLUS LANCET33G) MISC, USE   TO CHECK GLUCOSE 4 TIMES DAILY, Disp: 100 each, Rfl: 3   latanoprost  (XALATAN ) 0.005 % ophthalmic solution, Place 1 drop into both eyes at bedtime., Disp: , Rfl:    levothyroxine  (SYNTHROID , LEVOTHROID) 75 MCG tablet, Take 75 mcg by mouth daily before breakfast., Disp: , Rfl:    LUMIGAN 0.01 % SOLN, Place 1 drop into both eyes at bedtime., Disp: , Rfl:    methylPREDNISolone  (MEDROL  DOSEPAK) 4 MG TBPK tablet, Take as directed, Disp: 21 tablet, Rfl: 0   Multiple Vitamins-Minerals (ALIVE ONCE DAILY WOMENS 50+ PO), Take 1 tablet by mouth daily., Disp: , Rfl:    Omega-3 Fatty Acids (FISH OIL) 1200 MG CAPS, Take 1,200 mg by mouth at bedtime. Taking 2 capsules at night, Disp: , Rfl:    ondansetron  (ZOFRAN ) 4 MG tablet, Take 1 tablet (4 mg total) by mouth every 8 (eight) hours as needed for nausea or vomiting., Disp: 20 tablet, Rfl: 0   spironolactone  (ALDACTONE ) 25 MG tablet, Take 1 tablet (25 mg total) by mouth daily., Disp: 90 tablet, Rfl: 3   torsemide  (DEMADEX ) 20 MG tablet, Take 2 tablets (40 mg total) by mouth daily., Disp: 180 tablet, Rfl: 3   Travoprost, BAK Free, (TRAVATAN) 0.004 % SOLN ophthalmic solution, Place 1 drop into both eyes at bedtime., Disp: , Rfl:   Social History   Tobacco Use  Smoking Status Former   Current packs/day: 0.00   Average packs/day: 1 pack/day for 22.0 years (22.0  ttl pk-yrs)   Types: Cigarettes   Start date: 49   Quit date: 46   Years since quitting: 42.4  Smokeless Tobacco Never    Allergies  Allergen Reactions   Codeine Other (See Comments)   Other Other (See Comments)    Blood Product Refusal (Jehovah's witness)     Tape Other (See Comments)    Leaves blisters and marks on the skin  Sulfa Antibiotics Rash   Uloric  [Febuxostat ] Rash   Objective:  Vascular: DP/PT pulses 2/4 bilateral. CFT <3 seconds. Absent hair growth on digits. Edema noted to bilateral lower extremities. Xerosis noted bilaterally.  Skin. No lacerations or abrasions bilateral feet. Nails 1-5 bilateral  are thickened discolored and elongated with subungual debris. Left second digit amputation . Left hallux with hyperkeratosis noted and upon debridedment ulceration noted to plantar hallux with granular base. Nearly healed. No erythema edema or purulence noted. No probe to bone Musculoskeletal: MMT 5/5 bilateral lower extremities in DF, PF, Inversion and Eversion. Deceased ROM in DF of ankle joint. Left second digit amputation.  No tenderness erythema or edema noted to the great toe currently.  Neurological: Sensation intact to light touch. Protective sensation diminished bilateral.   Assessment:   1. Skin ulcer of left great toe with fat layer exposed (HCC)   2. Type II diabetes mellitus with peripheral circulatory disorder [facility])           Plan:  Patient was evaluated and treated and all questions answered. Ulcer left hallux with fat layer exposed   -Debridement as below. -Dressed with hydrofera blue, DSD. -Off-loading with surgical shoe and offloading padding.  -Has DM shoes but advised to hold off until we get this healed.  -No abx indicated.  -Discussed glucose control and proper protein-rich diet.  -Discussed if any worsening redness, pain, fever or chills to call or may need to report to the emergency room. Patient expressed understanding.    Procedure: Excisional Debridement of Wound Rationale: Removal of non-viable soft tissue from the wound to promote healing.  Anesthesia: none Pre-Debridement Wound Measurements: Overlying callus  Post-Debridement Wound Measurements: 0.2 cm x 0.3 cm x 0.2 cm  Type of Debridement: Sharp Excisional Tissue Removed: Non-viable soft tissue Depth of Debridement: subcutaneous tissue. Technique: Sharp excisional debridement to bleeding, viable wound base.  Dressing: Dry, sterile, compression dressing. Disposition: Patient tolerated procedure well. Patient to return in 4 week for follow-up. Patient is going out of town for a month and advised if any trouble to go to ED.   No follow-ups on file.   No follow-ups on file.

## 2023-10-31 ENCOUNTER — Ambulatory Visit (INDEPENDENT_AMBULATORY_CARE_PROVIDER_SITE_OTHER): Admitting: Audiology

## 2023-10-31 ENCOUNTER — Other Ambulatory Visit (HOSPITAL_COMMUNITY): Payer: Self-pay

## 2023-10-31 ENCOUNTER — Institutional Professional Consult (permissible substitution) (INDEPENDENT_AMBULATORY_CARE_PROVIDER_SITE_OTHER): Admitting: Otolaryngology

## 2023-10-31 NOTE — Progress Notes (Signed)
 Paramedicine Encounter    Patient ID: Danielle [name], female    DOB: [birth year], 78 y.o.   MRN: [id number]   Complaints- hearing loss, left ear ache, left toe pain   Assessment- CAOX4, warm and dry ambulatory without shortness of breath. Weight up 10lbs but she says she is not short of breath and no lower leg edema noted. Lungs clear. She reports being constipated and not having regular BM's I advised her to get metamucil. She agreed.   Compliance with meds- missed a few doses   Pill box filled- for one week   Refills needed-  -gabapentin  -levothyroxine  -allupurinol -torsemide   ([facility])   Meds changes since last visit- added ceterizine 10mg  daily      Social changes- none    VISIT SUMMARY- Arrived for home visit for Danielle who reports to be feeling okay but having hearing loss, left ear ache, left toe pain. She will be seen by ENT next Tuesday for follow up for ear issues. She was seen by podiatry yesterday for toe eval. I obtained vitals and noted her weight is up 10lbs from last week- she has no shortness of breath, no lower leg edema. Lungs clear. She reports she has not had a BM in a few days. She has issues with constipation. I advised her to add metamucil to her diet. She agreed. I reviewed HF education with diet and sodium and fluid restrictions. I reviewed meds and filled pill box for one week. I will have Katie or Dede follow up over the next few weeks. She agreed. Appointment scheduled and reviewed. Home visit complete.   BP 110/60   Pulse 77   Resp 16   Wt 201 lb (91.2 kg)   SpO2 98%   BMI 34.50 kg/m  Weight yesterday-- 200lbs  Last visit weight-- 191lbs      ACTION: Home visit completed     Patient Care Team: Danielle Star, MD as PCP - General (Internal Medicine) Lei Pump, MD as PCP - Electrophysiology (Cardiology) Jacqueline Matsu, MD as PCP - Sleep Medicine (Cardiology) Bensimhon, Rheta Celestine, MD as PCP - Cardiology  (Cardiology) Denton Flakes, LCSW as Social Worker (Licensed Clinical Social Worker) Shamleffer, Julian Obey, MD as Consulting Physician (Endocrinology)  Patient Active Problem List   Diagnosis Date Noted   Chronic systolic heart failure (HCC) 05/08/2022   Type 2 diabetes mellitus with stage 3a chronic kidney disease, with long-term current use of insulin  (HCC) 02/16/2022   Prosthetic joint implant failure, initial encounter (HCC) 12/20/2021   Pseudophakia of both eyes 11/15/2021   DVT (deep venous thrombosis) (HCC) 07/31/2021   Acute blood loss anemia    Septic Arthritis, Infection of prosthetic right knee joint (HCC), MSSA infection 07/10/2021   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) 07/10/2021   CAD (coronary artery disease) 06/08/2021   Carotid artery disease (HCC) 06/08/2021   Symptomatic bradycardia 06/08/2021   Paroxysmal atrial fibrillation (HCC) 06/08/2021   CKD (chronic kidney disease), stage III (HCC) 06/08/2021   Polyneuropathy associated with underlying disease (HCC) 05/04/2020   Severe nonproliferative diabetic retinopathy of left eye, with macular edema, associated with type 2 diabetes mellitus (HCC) 11/05/2019   Severe nonproliferative diabetic retinopathy of right eye, with macular edema, associated with type 2 diabetes mellitus (HCC) 11/05/2019   Retinal hemorrhage of right eye 11/05/2019   Retinal hemorrhage of left eye 11/05/2019   Diabetes mellitus (HCC) 05/14/2019   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, with long-term current use  of insulin  (HCC) 05/14/2019   Type 2 diabetes mellitus with diabetic polyneuropathy, with long-term current use of insulin  (HCC) 05/14/2019   History of gout 05/20/2018   Primary localized osteoarthritis of right knee 05/13/2018   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) 12/20/2017   Obesity (BMI 30-39.9) 11/11/2017   OSA (obstructive sleep apnea) 02/18/2017   Essential hypertension    L Breast  Cancer    Hypothyroidism     Current Outpatient Medications:    acetaminophen  (TYLENOL ) 500 MG tablet, Take 500-1,000 mg by mouth every 6 (six) hours as needed (for pain or headaches)., Disp: , Rfl:    allopurinol  (ZYLOPRIM ) 100 MG tablet, Take 100 mg by mouth daily., Disp: , Rfl:    apixaban  (ELIQUIS ) 5 MG TABS tablet, Take 1 tablet (5 mg total) by mouth 2 (two) times daily., Disp: 180 tablet, Rfl: 3   atorvastatin  (LIPITOR ) 80 MG tablet, Take 1 tablet (80 mg total) by mouth at bedtime., Disp: 100 tablet, Rfl: 3   B Complex-Biotin -FA (B-COMPLEX PO), Take 1 tablet by mouth daily., Disp: , Rfl:    Biotin  10 MG TABS, Take 10 mg by mouth daily., Disp: , Rfl:    Blood Glucose Monitoring Suppl (ONETOUCH VERIO) w/Device KIT, Use to check blood sugar, Disp: 1 kit, Rfl: 0   carvedilol  (COREG ) 3.125 MG tablet, Take 1 tablet (3.125 mg total) by mouth 2 (two) times daily with a meal., Disp: 180 tablet, Rfl: 3   cetirizine (ZYRTEC) 10 MG tablet, Take 10 mg by mouth daily., Disp: , Rfl:    Cinnamon 500 MG capsule, Take 500 mg by mouth daily., Disp: , Rfl:    clotrimazole-betamethasone (LOTRISONE) lotion, Apply 1 application topically 2 (two) times daily as needed (itching)., Disp: , Rfl:    Continuous Glucose Sensor (DEXCOM G6 SENSOR) MISC, 1 Device by Does not apply route as directed., Disp: 9 each, Rfl: 3   Continuous Glucose Transmitter (DEXCOM G6 TRANSMITTER) MISC, Change every 90 days, Disp: 1 each, Rfl: 3   empagliflozin  (JARDIANCE ) 10 MG TABS tablet, Take 1 tablet (10 mg total) by mouth daily., Disp: 90 tablet, Rfl: 3   ferrous sulfate  325 (65 FE) MG tablet, Take 1 tablet (325 mg total) by mouth 3 (three) times a week., Disp: 45 tablet, Rfl: 3   folic acid  (FOLVITE ) 1 MG tablet, Take 1 tablet by mouth once daily, Disp: 90 tablet, Rfl: 3   gabapentin  (NEURONTIN ) 300 MG capsule, Take 300 mg by mouth 2 (two) times daily., Disp: , Rfl:    glucose blood (ONETOUCH VERIO) test strip, Check blood sugar 2  times daily, Disp: 100 each, Rfl: 12   insulin  aspart (NOVOLOG  FLEXPEN) 100 UNIT/ML FlexPen, DIAL  AND INJECT UNDER THE SKIN THREE TIMES DAILY PER CORRECTION SCALE. MAX DAILY DOSE 50 UNITS, Disp: 15 mL, Rfl: 0   Insulin  Glargine (BASAGLAR  KWIKPEN) 100 UNIT/ML, Inject 40 Units into the skin at bedtime., Disp: 45 mL, Rfl: 2   Insulin  Pen Needle 32G X 4 MM MISC, 1 Device by Does not apply route in the morning, at noon, in the evening, and at bedtime., Disp: 400 each, Rfl: 3   Lancets (ONETOUCH DELICA PLUS LANCET33G) MISC, USE   TO CHECK GLUCOSE 4 TIMES DAILY, Disp: 100 each, Rfl: 3   latanoprost  (XALATAN ) 0.005 % ophthalmic solution, Place 1 drop into both eyes at bedtime., Disp: , Rfl:    levothyroxine  (SYNTHROID , LEVOTHROID) 75 MCG tablet, Take 75 mcg by mouth daily before breakfast., Disp: , Rfl:  LUMIGAN 0.01 % SOLN, Place 1 drop into both eyes at bedtime., Disp: , Rfl:    Multiple Vitamins-Minerals (ALIVE ONCE DAILY WOMENS 50+ PO), Take 1 tablet by mouth daily., Disp: , Rfl:    Omega-3 Fatty Acids (FISH OIL) 1200 MG CAPS, Take 1,200 mg by mouth at bedtime. Taking 2 capsules at night, Disp: , Rfl:    ondansetron  (ZOFRAN ) 4 MG tablet, Take 1 tablet (4 mg total) by mouth every 8 (eight) hours as needed for nausea or vomiting., Disp: 20 tablet, Rfl: 0   spironolactone  (ALDACTONE ) 25 MG tablet, Take 1 tablet (25 mg total) by mouth daily., Disp: 90 tablet, Rfl: 3   torsemide  (DEMADEX ) 20 MG tablet, Take 2 tablets (40 mg total) by mouth daily., Disp: 180 tablet, Rfl: 3   Travoprost, BAK Free, (TRAVATAN) 0.004 % SOLN ophthalmic solution, Place 1 drop into both eyes at bedtime., Disp: , Rfl:    insulin  lispro (HUMALOG  KWIKPEN) 100 UNIT/ML KwikPen, Take HUmalog  8 units with each meal PLUS Humalog  correctional insulin  scale max daily dose 50 units (Patient not taking: Reported on 10/31/2023), Disp: 30 mL, Rfl: 3   Insulin  Lispro (HUMALOG  [place]), Inject 8 Units into the skin in the morning, at noon, and at bedtime.  (Patient not taking: Reported on 10/31/2023), Disp: , Rfl:    methylPREDNISolone  (MEDROL  DOSEPAK) 4 MG TBPK tablet, Take as directed (Patient not taking: Reported on 10/31/2023), Disp: 21 tablet, Rfl: 0 Allergies  Allergen Reactions   Codeine Other (See Comments)   Other Other (See Comments)    Blood Product Refusal (Jehovah's witness)     Tape Other (See Comments)    Leaves blisters and marks on the skin   Sulfa Antibiotics Rash   Uloric  [Febuxostat ] Rash     Social History   Socioeconomic History   Marital status: Widowed    Spouse name: Not on file   Number of children: 3   Years of education: Not on file   Highest education level: Some college, no degree  Occupational History   Occupation: retired  Tobacco Use   Smoking status: Former    Current packs/day: 0.00    Average packs/day: 1 pack/day for 22.0 years (22.0 ttl pk-yrs)    Types: Cigarettes    Start date: 23    Quit date: 1983    Years since quitting: 42.4   Smokeless tobacco: Never  Vaping Use   Vaping status: Never Used  Substance and Sexual Activity   Alcohol use: Yes    Comment: 05/22/2018 "glass of wine couple times/wk"   Drug use: Not Currently   Sexual activity: Not Currently    Birth control/protection: None, Post-menopausal  Other Topics Concern   Not on file  Social History Narrative   Not on file   Social Drivers of Health   Financial Resource Strain: Medium Risk (12/22/2021)   Overall Financial Resource Strain (CARDIA)    Difficulty of Paying Living Expenses: Somewhat hard  Food Insecurity: No Food Insecurity (05/08/2018)   Hunger Vital Sign    Worried About Running Out of Food in the Last Year: Never true    Ran Out of Food in the Last Year: Never true  Transportation Needs: Unmet Transportation Needs (05/08/2022)   PRAPARE - Administrator, Civil Service (Medical): Yes    Lack of Transportation (Non-Medical): Yes  Physical Activity: Not on file  Stress: Not on file  Social  Connections: Not on file  Intimate Partner Violence: Not on file  Physical Exam      Future Appointments  Date Time Provider Department Center  11/03/2023 10:00 AM [facility] [place]  11/07/2023  3:30 PM [facility] PA/NP [facility] None  11/20/2023  9:45 AM Jennefer Moats, DPM [facility]  01/14/2024  7:05 AM [facility] DEVICE REMOTES [facility]  04/14/2024  7:05 AM [facility] DEVICE REMOTES [facility]  07/14/2024  7:05 AM [facility] DEVICE REMOTES [facility]  10/13/2024  7:05 AM [facility] DEVICE REMOTES [facility]  01/12/2025  7:05 AM [facility] DEVICE REMOTES [facility]  04/13/2025  7:05 AM [facility] DEVICE REMOTES [facility]  07/13/2025  7:05 AM [facility] DEVICE REMOTES [facility]

## 2023-11-03 ENCOUNTER — Ambulatory Visit (HOSPITAL_COMMUNITY)

## 2023-11-04 ENCOUNTER — Ambulatory Visit (HOSPITAL_COMMUNITY)

## 2023-11-04 ENCOUNTER — Ambulatory Visit (HOSPITAL_COMMUNITY)
Admission: RE | Admit: 2023-11-04 | Discharge: 2023-11-04 | Disposition: A | Discharge status: Home / Self Care | Source: Ambulatory Visit | Attending: Internal Medicine | Admitting: Internal Medicine

## 2023-11-04 DIAGNOSIS — R519 Headache, unspecified: Secondary | ICD-10-CM | POA: Insufficient documentation

## 2023-11-04 DIAGNOSIS — G9389 Other specified disorders of brain: Secondary | ICD-10-CM | POA: Diagnosis not present

## 2023-11-04 DIAGNOSIS — I6523 Occlusion and stenosis of bilateral carotid arteries: Secondary | ICD-10-CM | POA: Diagnosis not present

## 2023-11-05 DIAGNOSIS — H6123 Impacted cerumen, bilateral: Secondary | ICD-10-CM | POA: Diagnosis not present

## 2023-11-07 ENCOUNTER — Encounter (HOSPITAL_COMMUNITY)

## 2023-11-07 ENCOUNTER — Other Ambulatory Visit (HOSPITAL_COMMUNITY): Payer: Self-pay | Admitting: Emergency Medicine

## 2023-11-07 NOTE — Progress Notes (Signed)
 Paramedicine Encounter    Patient ID: Danielle [name], female    DOB: Nov 10, 1945, 78 y.o.   MRN: [id number]   Complaints NONE  Assessment A&O x 4, skin W&D w/ good color.  She denies chest pain or SOB.  She does have edema to lower extremities.  States she normally wear compression stockings but has not worn them today.  Compliance with meds YES  Pill box filled - pt had tried to do meds herself but box had to be deconstructed due to multiple med errors.  Refills needed n/a  Meds changes since last visit NONE    Social changes NONE  Seeing Ms. Glymph today and assisted her with reapplying her Dexcom 6 sensor w/o incident.  Also had to deconstruct med box and put back together for accuracy. She [facility] appt [facility] and rescheduled it for 11/11/23 @ 12:00. Ms. Folsom has not complaints of chest pain or SOB.  Lung sounds clear throughout.  She has some edema noted to her lower extremities and says she needs to be wearing her compression socks and I encouraged her to do so.  I will follow up w/ her on her pending appointment w/ the [facility] for any changes.  BP 100/60 (BP Location: Right Arm, Patient Position: Sitting, Cuff Size: Normal)   Pulse 72   Resp 16   Wt 201 lb 6.4 oz (91.4 kg)   SpO2 94%   BMI 34.57 kg/m  Weight yesterday- Last visit weight-201lb  ACTION: Home visit completed  Carlton Chick [phone] 11/07/23  Patient Care Team: Merl Star, MD as PCP - General (Internal Medicine) Lei Pump, MD as PCP - Electrophysiology (Cardiology) Jacqueline Matsu, MD as PCP - Sleep Medicine (Cardiology) Bensimhon, Rheta Celestine, MD as PCP - Cardiology (Cardiology) Denton Flakes, LCSW as Social Worker (Licensed Clinical Social Worker) Shamleffer, Julian Obey, MD as Consulting Physician (Endocrinology)  Patient Active Problem List   Diagnosis Date Noted   Chronic systolic heart failure (HCC) 05/08/2022   Type 2 diabetes mellitus with  stage 3a chronic kidney disease, with long-term current use of insulin  (HCC) 02/16/2022   Prosthetic joint implant failure, initial encounter (HCC) 12/20/2021   Pseudophakia of both eyes 11/15/2021   DVT (deep venous thrombosis) (HCC) 07/31/2021   Acute blood loss anemia    Septic Arthritis, Infection of prosthetic right knee joint (HCC), MSSA infection 07/10/2021   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) 07/10/2021   CAD (coronary artery disease) 06/08/2021   Carotid artery disease (HCC) 06/08/2021   Symptomatic bradycardia 06/08/2021   Paroxysmal atrial fibrillation (HCC) 06/08/2021   CKD (chronic kidney disease), stage III (HCC) 06/08/2021   Polyneuropathy associated with underlying disease (HCC) 05/04/2020   Severe nonproliferative diabetic retinopathy of left eye, with macular edema, associated with type 2 diabetes mellitus (HCC) 11/05/2019   Severe nonproliferative diabetic retinopathy of right eye, with macular edema, associated with type 2 diabetes mellitus (HCC) 11/05/2019   Retinal hemorrhage of right eye 11/05/2019   Retinal hemorrhage of left eye 11/05/2019   Diabetes mellitus (HCC) 05/14/2019   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, with long-term current use of insulin  (HCC) 05/14/2019   Type 2 diabetes mellitus with diabetic polyneuropathy, with long-term current use of insulin  (HCC) 05/14/2019   History of gout 05/20/2018   Primary localized osteoarthritis of right knee 05/13/2018   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) 12/20/2017   Obesity (BMI 30-39.9) 11/11/2017   OSA (obstructive sleep  apnea) 02/18/2017   Essential hypertension    L Breast Cancer    Hypothyroidism     Current Outpatient Medications:    acetaminophen  (TYLENOL ) 500 MG tablet, Take 500-1,000 mg by mouth every 6 (six) hours as needed (for pain or headaches)., Disp: , Rfl:    allopurinol  (ZYLOPRIM ) 100 MG tablet, Take 100 mg by mouth daily., Disp: , Rfl:     apixaban  (ELIQUIS ) 5 MG TABS tablet, Take 1 tablet (5 mg total) by mouth 2 (two) times daily., Disp: 180 tablet, Rfl: 3   atorvastatin  (LIPITOR ) 80 MG tablet, Take 1 tablet (80 mg total) by mouth at bedtime., Disp: 100 tablet, Rfl: 3   B Complex-Biotin -FA (B-COMPLEX PO), Take 1 tablet by mouth daily., Disp: , Rfl:    Biotin  10 MG TABS, Take 10 mg by mouth daily., Disp: , Rfl:    Blood Glucose Monitoring Suppl (ONETOUCH VERIO) w/Device KIT, Use to check blood sugar, Disp: 1 kit, Rfl: 0   carvedilol  (COREG ) 3.125 MG tablet, Take 1 tablet (3.125 mg total) by mouth 2 (two) times daily with a meal., Disp: 180 tablet, Rfl: 3   cetirizine (ZYRTEC) 10 MG tablet, Take 10 mg by mouth daily., Disp: , Rfl:    Cinnamon 500 MG capsule, Take 500 mg by mouth daily., Disp: , Rfl:    clotrimazole-betamethasone (LOTRISONE) lotion, Apply 1 application topically 2 (two) times daily as needed (itching)., Disp: , Rfl:    Continuous Glucose Sensor (DEXCOM G6 SENSOR) MISC, 1 Device by Does not apply route as directed., Disp: 9 each, Rfl: 3   Continuous Glucose Transmitter (DEXCOM G6 TRANSMITTER) MISC, Change every 90 days, Disp: 1 each, Rfl: 3   empagliflozin  (JARDIANCE ) 10 MG TABS tablet, Take 1 tablet (10 mg total) by mouth daily., Disp: 90 tablet, Rfl: 3   ferrous sulfate  325 (65 FE) MG tablet, Take 1 tablet (325 mg total) by mouth 3 (three) times a week., Disp: 45 tablet, Rfl: 3   folic acid  (FOLVITE ) 1 MG tablet, Take 1 tablet by mouth once daily, Disp: 90 tablet, Rfl: 3   gabapentin  (NEURONTIN ) 300 MG capsule, Take 300 mg by mouth 2 (two) times daily., Disp: , Rfl:    glucose blood (ONETOUCH VERIO) test strip, Check blood sugar 2 times daily, Disp: 100 each, Rfl: 12   insulin  aspart (NOVOLOG  FLEXPEN) 100 UNIT/ML FlexPen, DIAL  AND INJECT UNDER THE SKIN THREE TIMES DAILY PER CORRECTION SCALE. MAX DAILY DOSE 50 UNITS, Disp: 15 mL, Rfl: 0   Insulin  Glargine (BASAGLAR  KWIKPEN) 100 UNIT/ML, Inject 40 Units into the skin at  bedtime., Disp: 45 mL, Rfl: 2   insulin  lispro (HUMALOG  KWIKPEN) 100 UNIT/ML KwikPen, Take HUmalog  8 units with each meal PLUS Humalog  correctional insulin  scale max daily dose 50 units (Patient not taking: Reported on 10/31/2023), Disp: 30 mL, Rfl: 3   Insulin  Lispro (HUMALOG  [place]), Inject 8 Units into the skin in the morning, at noon, and at bedtime. (Patient not taking: Reported on 10/31/2023), Disp: , Rfl:    Insulin  Pen Needle 32G X 4 MM MISC, 1 Device by Does not apply route in the morning, at noon, in the evening, and at bedtime., Disp: 400 each, Rfl: 3   Lancets (ONETOUCH DELICA PLUS LANCET33G) MISC, USE   TO CHECK GLUCOSE 4 TIMES DAILY, Disp: 100 each, Rfl: 3   latanoprost  (XALATAN ) 0.005 % ophthalmic solution, Place 1 drop into both eyes at bedtime., Disp: , Rfl:    levothyroxine  (SYNTHROID , LEVOTHROID) 75 MCG tablet, Take  75 mcg by mouth daily before breakfast., Disp: , Rfl:    LUMIGAN 0.01 % SOLN, Place 1 drop into both eyes at bedtime., Disp: , Rfl:    methylPREDNISolone  (MEDROL  DOSEPAK) 4 MG TBPK tablet, Take as directed (Patient not taking: Reported on 10/31/2023), Disp: 21 tablet, Rfl: 0   Multiple Vitamins-Minerals (ALIVE ONCE DAILY WOMENS 50+ PO), Take 1 tablet by mouth daily., Disp: , Rfl:    Omega-3 Fatty Acids (FISH OIL) 1200 MG CAPS, Take 1,200 mg by mouth at bedtime. Taking 2 capsules at night, Disp: , Rfl:    ondansetron  (ZOFRAN ) 4 MG tablet, Take 1 tablet (4 mg total) by mouth every 8 (eight) hours as needed for nausea or vomiting., Disp: 20 tablet, Rfl: 0   spironolactone  (ALDACTONE ) 25 MG tablet, Take 1 tablet (25 mg total) by mouth daily., Disp: 90 tablet, Rfl: 3   torsemide  (DEMADEX ) 20 MG tablet, Take 2 tablets (40 mg total) by mouth daily., Disp: 180 tablet, Rfl: 3   Travoprost, BAK Free, (TRAVATAN) 0.004 % SOLN ophthalmic solution, Place 1 drop into both eyes at bedtime., Disp: , Rfl:  Allergies  Allergen Reactions   Codeine Other (See Comments)   Other Other (See Comments)     Blood Product Refusal (Jehovah's witness)     Tape Other (See Comments)    Leaves blisters and marks on the skin   Sulfa Antibiotics Rash   Uloric  [Febuxostat ] Rash     Social History   Socioeconomic History   Marital status: Widowed    Spouse name: Not on file   Number of children: 3   Years of education: Not on file   Highest education level: Some college, no degree  Occupational History   Occupation: retired  Tobacco Use   Smoking status: Former    Current packs/day: 0.00    Average packs/day: 1 pack/day for 22.0 years (22.0 ttl pk-yrs)    Types: Cigarettes    Start date: 29    Quit date: 44    Years since quitting: 42.4   Smokeless tobacco: Never  Vaping Use   Vaping status: Never Used  Substance and Sexual Activity   Alcohol use: Yes    Comment: 05/22/2018 glass of wine couple times/wk   Drug use: Not Currently   Sexual activity: Not Currently    Birth control/protection: None, Post-menopausal  Other Topics Concern   Not on file  Social History Narrative   Not on file   Social Drivers of Health   Financial Resource Strain: Medium Risk (12/22/2021)   Overall Financial Resource Strain (CARDIA)    Difficulty of Paying Living Expenses: Somewhat hard  Food Insecurity: No Food Insecurity (05/08/2018)   Hunger Vital Sign    Worried About Running Out of Food in the Last Year: Never true    Ran Out of Food in the Last Year: Never true  Transportation Needs: Unmet Transportation Needs (05/08/2022)   PRAPARE - Administrator, Civil Service (Medical): Yes    Lack of Transportation (Non-Medical): Yes  Physical Activity: Not on file  Stress: Not on file  Social Connections: Not on file  Intimate Partner Violence: Not on file    Physical Exam      Future Appointments  Date Time Provider Department Center  11/11/2023 12:00 PM [facility] PA/NP [facility] None  11/20/2023  9:45 AM Jennefer Moats, DPM [facility]  01/14/2024  7:05 AM  [facility] DEVICE REMOTES [facility]  04/14/2024  7:05 AM [facility] H[facility]  DEVICE REMOTES [facility]  07/14/2024  7:05 AM [facility] DEVICE REMOTES [facility]  10/13/2024  7:05 AM [facility] DEVICE REMOTES [facility]  01/12/2025  7:05 AM [facility] DEVICE REMOT[facility]  04/13/2025  7:05 AM [facility] DEVICE REMOTES [facility]  07/13/2025  7:05 AM [facility] DEVICE REMOTES [facility]

## 2023-11-10 NOTE — Progress Notes (Signed)
 [facility]   PCP: Merl Star, MD EP: Dr. Lawana Pray Rheumatology: Dr. Peggye Bowers HF Cardiologist: Dr. Julane Ny  HPI: Danielle [name] is a 78 y.o.female with a past medical history of systolic HF (EF 45-50%), CAD s/p multiple stents in [place] (with h/o LAD stent thrombosis), ischemic cardiomyopathy (EF 10% at one point), symptomatic bradycardia s/p PPM, HTN, DM, hypothyroidism, and OSA.   Admitted 9/18 with SOB, acute on chronic systolic CHF. Diuresed with IV lasix  (12 pounds), discharge weight was 215 pounds.    Had R TKR 12/19.    Admitted 11/22 with right knee septic arthritis. Arthrocentesis culture grew staph aureus.  Underwent I&D and polyexchange of infected right total knee arthroplasty.  Treated with 6 weeks IV antibiotics followed by suppressive abx. Aldactone  held, losartan  reduced to 25 mg daily and coreg  reduced to 12.5 mg BID d/t soft BP. She was given 1 dose of furosemide  on admission due to leg edema but this was held later due to AKI.  Torsemide  was resumed at discharge.  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  Patient underwent explant right total knee arthroplasty with implantation of articulating antibiotic spacer by Dr. Pryor Browning on 07/12/2021. Placed on IV abx. Was discharged to SNF. During hospitalization was very anemic, but improving.   Echo 2/23 EF 40-45%   Follow up 4/23, unsure what meds she was taking. Re-enrolled in paramedicine.   Had knee re-replaced 12/20/21  Echo 05/08/22: EF 45-50%, mildly decreased LV with moderate LVH, grade II DD, RV ok, mild MR with severe MAC.  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  Today she returns for AHF follow up with her daughter. Overall feeling ok, having a lot of musculoskeletal pain. Denies palpitations, CP, dizziness, edema, or PND/Orthopnea. Denies SOB. Appetite ok. No fever or chills. Weight at home 201 pounds. Taking all medications but does miss some  occasionally. Denies ETOH, tobacco or drug use.  Has been followed by paramedicine, compliance has improved. She is very thankful for this service.   Cardiac Studies: - Echo 05/17/23, EF 40-45% - Echo (12/23): EF 45-50% - Echo (2/23): EF 40-45%  - Echo (12/21): EF 45-50% G2DD  - Echo (8/19): EF 45-50%, G2DD, LA mildly dilated, trileaflet moderately thick aortic valve - Echo (9/18): EF 45-50% Grade 2DD Left Atrium Severely dilated  ROS: All systems negative except as listed in HPI, PMH and Problem List.  SH:  Social History   Socioeconomic History   Marital status: Widowed    Spouse name: Not on file   Number of children: 3   Years of education: Not on file   Highest education level: Some college, no degree  Occupational History   Occupation: retired  Tobacco Use   Smoking status: Former    Current packs/day: 0.00    Average packs/day: 1 pack/day for 22.0 years (22.0 ttl pk-yrs)    Types: Cigarettes    Start date: 74    Quit date: 1983    Years since quitting: 42.4   Smokeless tobacco: Never  Vaping Use   Vaping status: Never Used  Substance and Sexual Activity   Alcohol use: Yes    Comment: 05/22/2018 glass of wine couple times/wk   Drug use: Not Currently   Sexual activity: Not Currently    Birth control/protection: None, Post-menopausal  Other Topics Concern   Not on file  Social History Narrative   Not on file   Social Drivers of Health   Financial Resource Strain: Medium  Risk (12/22/2021)   Overall Financial Resource Strain (CARDIA)    Difficulty of Paying Living Expenses: Somewhat hard  Food Insecurity: No Food Insecurity (05/08/2018)   Hunger Vital Sign    Worried About Running Out of Food in the Last Year: Never true    Ran Out of Food in the Last Year: Never true  Transportation Needs: Unmet Transportation Needs (05/08/2022)   PRAPARE - Administrator, Civil Service (Medical): Yes    Lack of Transportation (Non-Medical): Yes  Physical  Activity: Not on file  Stress: Not on file  Social Connections: Not on file  Intimate Partner Violence: Not on file   FH:  Family History  Problem Relation Age of Onset   Multiple myeloma Mother    Heart disease Father    Other Sister    Heart disease Brother    Diabetes Sister    Other Sister    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HCC) 02/05/2017   Acute gout due to renal impairment involving right wrist 05/20/2018   Acute kidney failure (HCC) 05/15/2018   Arthritis    all over (05/22/2018)   Blood dyscrasia    per pt-has small blood cells-appears as if anemic   Breast cancer, left breast (HCC) 1985   CHF (congestive heart failure) (HCC)    Complication of anesthesia    difficult to awaken from per pt   Coronary artery disease    Dyspnea    Essential hypertension    Heart disease    Heart murmur    History of gout    Hypothyroidism    Obesity (BMI 30-39.9) 11/11/2017   OSA (obstructive sleep apnea) 02/18/2017    severe obstructive sleep apnea with an AHI of 75.6/h and mild central sleep apnea with a CAI of 7.7/h.  Oxygen saturations dropped as low as 82%.   He is on CPAP at 9 cm H2O.   OSA on CPAP    Personal history of chemotherapy    Personal history of radiation therapy    Presence of permanent cardiac pacemaker    Primary localized osteoarthritis of right knee 05/13/2018   Refusal of blood transfusions as patient is Jehovah's Witness    Type II diabetes mellitus (HCC)    Current Outpatient Medications  Medication Sig Dispense Refill   acetaminophen  (TYLENOL ) 500 MG tablet Take 500-1,000 mg by mouth every 6 (six) hours as needed (for pain or headaches).     allopurinol  (ZYLOPRIM ) 100 MG tablet Take 100 mg by mouth daily.     apixaban  (ELIQUIS ) 5 MG TABS tablet Take 1 tablet (5 mg total) by mouth 2 (two) times daily. 180 tablet 3   atorvastatin  (LIPITOR ) 80 MG tablet Take 1 tablet (80 mg total)  by mouth at bedtime. 100 tablet 3   B Complex-Biotin -FA (B-COMPLEX PO) Take 1 tablet by mouth daily.     Biotin  10 MG TABS Take 10 mg by mouth daily.     Blood Glucose Monitoring Suppl (ONETOUCH VERIO) w/Device KIT Use to check blood sugar 1 kit 0   carvedilol  (COREG ) 3.125 MG tablet Take 1 tablet (3.125 mg total) by mouth 2 (two) times daily with a meal. 180 tablet 3   cetirizine (ZYRTEC) 10 MG tablet Take 10 mg by mouth daily.     Cinnamon 500 MG capsule Take 500 mg by mouth daily.     clotrimazole-betamethasone (LOTRISONE) lotion Apply 1 application topically 2 (two)  times daily as needed (itching).     Continuous Glucose Sensor (DEXCOM G6 SENSOR) MISC 1 Device by Does not apply route as directed. 9 each 3   Continuous Glucose Transmitter (DEXCOM G6 TRANSMITTER) MISC Change every 90 days 1 each 3   empagliflozin  (JARDIANCE ) 10 MG TABS tablet Take 1 tablet (10 mg total) by mouth daily. 90 tablet 3   ferrous sulfate  325 (65 FE) MG tablet Take 1 tablet (325 mg total) by mouth 3 (three) times a week. 45 tablet 3   folic acid  (FOLVITE ) 1 MG tablet Take 1 tablet by mouth once daily 90 tablet 3   gabapentin  (NEURONTIN ) 300 MG capsule Take 300 mg by mouth 2 (two) times daily.     glucose blood (ONETOUCH VERIO) test strip Check blood sugar 2 times daily 100 each 12   insulin  aspart (NOVOLOG  FLEXPEN) 100 UNIT/ML FlexPen DIAL  AND INJECT UNDER THE SKIN THREE TIMES DAILY PER CORRECTION SCALE. MAX DAILY DOSE 50 UNITS 15 mL 0   Insulin  Glargine (BASAGLAR  KWIKPEN) 100 UNIT/ML Inject 40 Units into the skin at bedtime. 45 mL 2   insulin  lispro (HUMALOG  KWIKPEN) 100 UNIT/ML KwikPen Take HUmalog  8 units with each meal PLUS Humalog  correctional insulin  scale max daily dose 50 units 30 mL 3   Insulin  Lispro (HUMALOG  [place]) Inject 8 Units into the skin in the morning, at noon, and at bedtime.     Insulin  Pen Needle 32G X 4 MM MISC 1 Device by Does not apply route in the morning, at noon, in the evening, and at bedtime.  400 each 3   Lancets (ONETOUCH DELICA PLUS LANCET33G) MISC USE   TO CHECK GLUCOSE 4 TIMES DAILY 100 each 3   latanoprost  (XALATAN ) 0.005 % ophthalmic solution Place 1 drop into both eyes at bedtime.     levothyroxine  (SYNTHROID , LEVOTHROID) 75 MCG tablet Take 75 mcg by mouth daily before breakfast.     LUMIGAN 0.01 % SOLN Place 1 drop into both eyes at bedtime.     Multiple Vitamins-Minerals (ALIVE ONCE DAILY WOMENS 50+ PO) Take 1 tablet by mouth daily.     Omega-3 Fatty Acids (FISH OIL) 1200 MG CAPS Take 1,200 mg by mouth at bedtime. Taking 2 capsules at night     ondansetron  (ZOFRAN ) 4 MG tablet Take 1 tablet (4 mg total) by mouth every 8 (eight) hours as needed for nausea or vomiting. 20 tablet 0   spironolactone  (ALDACTONE ) 25 MG tablet Take 1 tablet (25 mg total) by mouth daily. 90 tablet 3   torsemide  (DEMADEX ) 20 MG tablet Take 2 tablets (40 mg total) by mouth daily. 180 tablet 3   Travoprost, BAK Free, (TRAVATAN) 0.004 % SOLN ophthalmic solution Place 1 drop into both eyes at bedtime.     No current facility-administered medications for this encounter.   BP 124/86   Pulse 73   Ht 5' 4 (1.626 m)   Wt 93.7 kg (206 lb 9.6 oz)   SpO2 98%   BMI 35.46 kg/m   Wt Readings from Last 3 Encounters:  11/11/23 93.7 kg (206 lb 9.6 oz)  11/07/23 91.4 kg (201 lb 6.4 oz)  10/31/23 91.2 kg (201 lb)   PHYSICAL EXAM: General:  elderly appearing.  No respiratory difficulty. Walked into clinic.  Neck: supple. JVD difficult to see, ~9 cm.  Cor: PMI nondisplaced. Regular rate & rhythm. No rubs, gallops or murmurs. Lungs: clear, diminished bases Extremities: no cyanosis, clubbing, rash, non-pitting  BLE edema  Neuro: alert &  oriented x 3. Moves all 4 extremities w/o difficulty. Affect pleasant.   PPM interrogation (personally reviewed): 1 hr/day activity, < 0.1 hr/day in AF, no pace-terminated pauses, 97% AS-VS  ASSESSMENT & PLAN: 1. Chronic systolic CHF  - ICM. - Echo (12/21): EF 45-50% G2DD   - Echo (2/23): EF 40-45%  - Echo (12/23): EF 45-50%, moderate LVH grade II DD, RV normal, severe MAC.  - Echo today 05/17/23: EF 40-45%, RV ok, mild to moderate MR - NYHA II, limited more by MSK pain. Volume relatively stable but her weight has gone up ~5lbs recently and has non pitting BLE edema - Continue compression hose. - Continue spironolactone  25 mg daily.  - Increase torsemide  40>60 mg daily for 3 days then back to 40 mg daily. Will also add additional 20 mg PRN for weight gain >3lbs overnight or >5lbs in 1 week.  - Continue carvedilol  3.125 mg bid. - Continue Jardiance  10 mg daily. Denies GU symptoms - Continue Paramedicine support - BMET, BNP today  2. CAD  - Stenting in 2010, 2012 and 2016.  - No chest pain. - Continue statin.  - Off ASA with Eliquis . CBC today   3. Paroxysmal Afib - Regular on exam - Continue Eliquis  5 mg bid.  - (She is Jehovah's Witness).   4. HTN - BP stable today - No change to meds today.   5. DM - Per Endocrine. Last A1c 10.2. PCP to update soon.  - On insulin . - Continue Jardiance . Could increase Jardiance  if Endocrinology recommends.    6. H/o sinus pauses s/p PPM - Dr. Lawana Pray now following.  - No change.    7. OSA - Continue CPAP nightly.  - Follows with Dr. Micael Adas. Needs follow up, heather working on getting her in for new CPAP machine.   8. CKD 3b - Baseline SCr ~ 1.5-2.0 - Last Scr 1.64 (9/24) - Continue Jardiance .   9. 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   Followed by paramedicine, compliance has improved.   Follow up in 6 months with Dr. Bensimhon.   Sheryl Donna, NP  12:18 PM

## 2023-11-11 ENCOUNTER — Encounter (HOSPITAL_COMMUNITY): Payer: Self-pay

## 2023-11-11 ENCOUNTER — Ambulatory Visit (HOSPITAL_COMMUNITY)
Admission: RE | Admit: 2023-11-11 | Discharge: 2023-11-11 | Disposition: A | Discharge status: Home / Self Care | Source: Ambulatory Visit | Attending: Internal Medicine | Admitting: Internal Medicine

## 2023-11-11 ENCOUNTER — Ambulatory Visit (HOSPITAL_COMMUNITY): Payer: Self-pay | Admitting: Internal Medicine

## 2023-11-11 VITALS — BP 124/86 | HR 73 | Ht 64.0 in | Wt 206.6 lb

## 2023-11-11 DIAGNOSIS — I480 Paroxysmal atrial fibrillation: Secondary | ICD-10-CM | POA: Diagnosis not present

## 2023-11-11 DIAGNOSIS — Z794 Long term (current) use of insulin: Secondary | ICD-10-CM | POA: Insufficient documentation

## 2023-11-11 DIAGNOSIS — Z955 Presence of coronary angioplasty implant and graft: Secondary | ICD-10-CM | POA: Insufficient documentation

## 2023-11-11 DIAGNOSIS — E039 Hypothyroidism, unspecified: Secondary | ICD-10-CM | POA: Diagnosis not present

## 2023-11-11 DIAGNOSIS — I255 Ischemic cardiomyopathy: Secondary | ICD-10-CM | POA: Diagnosis not present

## 2023-11-11 DIAGNOSIS — I130 Hypertensive heart and chronic kidney disease with heart failure and stage 1 through stage 4 chronic kidney disease, or unspecified chronic kidney disease: Secondary | ICD-10-CM | POA: Insufficient documentation

## 2023-11-11 DIAGNOSIS — I2510 Atherosclerotic heart disease of native coronary artery without angina pectoris: Secondary | ICD-10-CM | POA: Insufficient documentation

## 2023-11-11 DIAGNOSIS — G4733 Obstructive sleep apnea (adult) (pediatric): Secondary | ICD-10-CM | POA: Diagnosis not present

## 2023-11-11 DIAGNOSIS — I5022 Chronic systolic (congestive) heart failure: Secondary | ICD-10-CM | POA: Diagnosis not present

## 2023-11-11 DIAGNOSIS — E1122 Type 2 diabetes mellitus with diabetic chronic kidney disease: Secondary | ICD-10-CM | POA: Insufficient documentation

## 2023-11-11 DIAGNOSIS — Z7901 Long term (current) use of anticoagulants: Secondary | ICD-10-CM | POA: Insufficient documentation

## 2023-11-11 DIAGNOSIS — Z96651 Presence of right artificial knee joint: Secondary | ICD-10-CM | POA: Diagnosis not present

## 2023-11-11 DIAGNOSIS — Z87891 Personal history of nicotine dependence: Secondary | ICD-10-CM | POA: Insufficient documentation

## 2023-11-11 DIAGNOSIS — Z89422 Acquired absence of other left toe(s): Secondary | ICD-10-CM | POA: Diagnosis not present

## 2023-11-11 DIAGNOSIS — Z45018 Encounter for adjustment and management of other part of cardiac pacemaker: Secondary | ICD-10-CM | POA: Diagnosis not present

## 2023-11-11 DIAGNOSIS — M7918 Myalgia, other site: Secondary | ICD-10-CM | POA: Insufficient documentation

## 2023-11-11 DIAGNOSIS — I10 Essential (primary) hypertension: Secondary | ICD-10-CM

## 2023-11-11 DIAGNOSIS — N1832 Chronic kidney disease, stage 3b: Secondary | ICD-10-CM | POA: Diagnosis not present

## 2023-11-11 DIAGNOSIS — Z7984 Long term (current) use of oral hypoglycemic drugs: Secondary | ICD-10-CM | POA: Insufficient documentation

## 2023-11-11 DIAGNOSIS — Z79899 Other long term (current) drug therapy: Secondary | ICD-10-CM | POA: Insufficient documentation

## 2023-11-11 LAB — CBC
HCT: 36.8 % (ref 36.0–46.0)
Hemoglobin: 11 g/dL — ABNORMAL LOW (ref 12.0–15.0)
MCH: 20.8 pg — ABNORMAL LOW (ref 26.0–34.0)
MCHC: 29.9 g/dL — ABNORMAL LOW (ref 30.0–36.0)
MCV: 69.4 fL — ABNORMAL LOW (ref 80.0–100.0)
Platelets: 181 10*3/uL (ref 150–400)
RBC: 5.3 MIL/uL — ABNORMAL HIGH (ref 3.87–5.11)
RDW: 16.4 % — ABNORMAL HIGH (ref 11.5–15.5)
WBC: 7.9 10*3/uL (ref 4.0–10.5)
nRBC: 0 % (ref 0.0–0.2)

## 2023-11-11 LAB — BASIC METABOLIC PANEL WITH GFR
Anion gap: 11 (ref 5–15)
BUN: 36 mg/dL — ABNORMAL HIGH (ref 8–23)
CO2: 28 mmol/L (ref 22–32)
Calcium: 9.1 mg/dL (ref 8.9–10.3)
Chloride: 101 mmol/L (ref 98–111)
Creatinine, Ser: 1.66 mg/dL — ABNORMAL HIGH (ref 0.44–1.00)
GFR, Estimated: 32 mL/min — ABNORMAL LOW (ref >60)
Glucose, Bld: 248 mg/dL — ABNORMAL HIGH (ref 70–99)
Potassium: 3.8 mmol/L (ref 3.5–5.1)
Sodium: 140 mmol/L (ref 135–145)

## 2023-11-11 LAB — BRAIN NATRIURETIC PEPTIDE: B Natriuretic Peptide: 80.7 pg/mL (ref 0.0–100.0)

## 2023-11-11 MED ORDER — TORSEMIDE 20 MG PO TABS: 40.0000 mg | ORAL_TABLET | Freq: Every day | ORAL | 3 refills | Status: DC

## 2023-11-11 NOTE — Patient Instructions (Signed)
 Take Torsemide  60 mg x 3 days and then return to taking 40 mg daily. May take an extra 40 mg as needed for increased weight gain, increased shortness of breath, or increased swelling. Labs today - will call you if abnormal. Return to see Dr. Julane Ny in [facility] in 6 months. CALL US  AT [phone] IN NOVEMBER TO SCHEDULE THIS APPOINTMENT.  Please call us  at [phone] if any questions or concerns prior to your next visit.

## 2023-11-20 ENCOUNTER — Ambulatory Visit (INDEPENDENT_AMBULATORY_CARE_PROVIDER_SITE_OTHER): Admitting: Podiatry

## 2023-11-20 DIAGNOSIS — Z91199 Patient's noncompliance with other medical treatment and regimen due to unspecified reason: Secondary | ICD-10-CM

## 2023-11-20 NOTE — Progress Notes (Signed)
 No show

## 2023-11-21 ENCOUNTER — Other Ambulatory Visit (HOSPITAL_COMMUNITY): Payer: Self-pay

## 2023-11-21 NOTE — Telephone Encounter (Signed)
 Follow up on Danielle [name]'s CPAP since paperwork was sent back in may- please advise any update on her receiving her CPAP if possible.   Thanks so much!   Powell Mirza, EMT-Paramedic [phone] 11/21/2023

## 2023-11-21 NOTE — Progress Notes (Signed)
 Paramedicine Encounter    Patient ID: Danielle [name], female    DOB: 04/14/1946, 78 y.o.   MRN: [id number]   Complaints- neck and ear pain   Assessment- CAOX4, warm and dry ambulatory in her apartment reporting to be feeling well with some neck pain and ear pain this is ongoing. Right lower edema noted- she reports she is urinating well. Weight is down two lbs this week. Lungs clear.   Compliance with meds- missed one dose- took the 3 days of 60mg  of torsemide  after her [facility] visit as instructed by Beckey.  Pill box filled- for two weeks   Refills needed- spironolactone    Meds changes since last visit- torsemide  40mg      Social changes- none    VISIT SUMMARY- Arrived for home visit for [facility] who reports to be feeling well but having some neck and ear pain which is on going. She plans to visit family in [place] for two weeks the week of the 16th of July. Assessment and vitals obtained as noted. Meds reviewed and pill box filled for two weeks. We rescheduled her Foot and Ankle appointment. Dexcom replaced- diabetic education provided. HF education provided for sodium and fluid reminders given. She was reminded to wear her compression socks as well. She says she hasn't worn them in the last three days due to difficulty putting them on and the heat. I gave her the link for the compression sock gator aide. She was appreciative. Home visit complete.   BP 122/70   Pulse 72   Resp 16   Wt 204 lb (92.5 kg)   SpO2 95%   BMI 35.02 kg/m  Weight yesterday-- 204lbs Last visit weight-- 206lbs      ACTION: Home visit completed     Patient Care Team: Rexanne Ingle, MD as PCP - General (Internal Medicine) Inocencio Soyla Lunger, MD as PCP - Electrophysiology (Cardiology) Shlomo Wilbert SAUNDERS, MD as PCP - Sleep Medicine (Cardiology) Bensimhon, Toribio SAUNDERS, MD as PCP - Cardiology (Cardiology) Cathern Andriette DEL, LCSW as Social Worker (Licensed Clinical Social Worker) Shamleffer, Donell Cardinal, MD as  Consulting Physician (Endocrinology)  Patient Active Problem List   Diagnosis Date Noted   Chronic systolic heart failure (HCC) 05/08/2022   Type 2 diabetes mellitus with stage 3a chronic kidney disease, with long-term current use of insulin  (HCC) 02/16/2022   Prosthetic joint implant failure, initial encounter (HCC) 12/20/2021   Pseudophakia of both eyes 11/15/2021   DVT (deep venous thrombosis) (HCC) 07/31/2021   Acute blood loss anemia    Septic Arthritis, Infection of prosthetic right knee joint (HCC), MSSA infection 07/10/2021   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) 07/10/2021   CAD (coronary artery disease) 06/08/2021   Carotid artery disease (HCC) 06/08/2021   Symptomatic bradycardia 06/08/2021   Paroxysmal atrial fibrillation (HCC) 06/08/2021   CKD (chronic kidney disease), stage III (HCC) 06/08/2021   Polyneuropathy associated with underlying disease (HCC) 05/04/2020   Severe nonproliferative diabetic retinopathy of left eye, with macular edema, associated with type 2 diabetes mellitus (HCC) 11/05/2019   Severe nonproliferative diabetic retinopathy of right eye, with macular edema, associated with type 2 diabetes mellitus (HCC) 11/05/2019   Retinal hemorrhage of right eye 11/05/2019   Retinal hemorrhage of left eye 11/05/2019   Diabetes mellitus (HCC) 05/14/2019   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, with long-term current use of insulin  (HCC) 05/14/2019   Type 2 diabetes mellitus with diabetic polyneuropathy, with long-term current use of insulin  (HCC) 05/14/2019   History  of gout 05/20/2018   Primary localized osteoarthritis of right knee 05/13/2018   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) 12/20/2017   Obesity (BMI 30-39.9) 11/11/2017   OSA (obstructive sleep apnea) 02/18/2017   Essential hypertension    L Breast Cancer    Hypothyroidism     Current Outpatient Medications:    acetaminophen  (TYLENOL ) 500 MG tablet, Take 500-1,000  mg by mouth every 6 (six) hours as needed (for pain or headaches)., Disp: , Rfl:    allopurinol  (ZYLOPRIM ) 100 MG tablet, Take 100 mg by mouth daily., Disp: , Rfl:    apixaban  (ELIQUIS ) 5 MG TABS tablet, Take 1 tablet (5 mg total) by mouth 2 (two) times daily., Disp: 180 tablet, Rfl: 3   atorvastatin  (LIPITOR ) 80 MG tablet, Take 1 tablet (80 mg total) by mouth at bedtime., Disp: 100 tablet, Rfl: 3   B Complex-Biotin -FA (B-COMPLEX PO), Take 1 tablet by mouth daily., Disp: , Rfl:    Biotin  10 MG TABS, Take 10 mg by mouth daily., Disp: , Rfl:    Blood Glucose Monitoring Suppl (ONETOUCH VERIO) w/Device KIT, Use to check blood sugar, Disp: 1 kit, Rfl: 0   carvedilol  (COREG ) 3.125 MG tablet, Take 1 tablet (3.125 mg total) by mouth 2 (two) times daily with a meal., Disp: 180 tablet, Rfl: 3   cetirizine (ZYRTEC) 10 MG tablet, Take 10 mg by mouth daily., Disp: , Rfl:    Cinnamon 500 MG capsule, Take 500 mg by mouth daily., Disp: , Rfl:    clotrimazole-betamethasone (LOTRISONE) lotion, Apply 1 application topically 2 (two) times daily as needed (itching)., Disp: , Rfl:    Continuous Glucose Sensor (DEXCOM G6 SENSOR) MISC, 1 Device by Does not apply route as directed., Disp: 9 each, Rfl: 3   Continuous Glucose Transmitter (DEXCOM G6 TRANSMITTER) MISC, Change every 90 days, Disp: 1 each, Rfl: 3   empagliflozin  (JARDIANCE ) 10 MG TABS tablet, Take 1 tablet (10 mg total) by mouth daily., Disp: 90 tablet, Rfl: 3   ferrous sulfate  325 (65 FE) MG tablet, Take 1 tablet (325 mg total) by mouth 3 (three) times a week., Disp: 45 tablet, Rfl: 3   folic acid  (FOLVITE ) 1 MG tablet, Take 1 tablet by mouth once daily, Disp: 90 tablet, Rfl: 3   gabapentin  (NEURONTIN ) 300 MG capsule, Take 300 mg by mouth 2 (two) times daily., Disp: , Rfl:    glucose blood (ONETOUCH VERIO) test strip, Check blood sugar 2 times daily, Disp: 100 each, Rfl: 12   insulin  aspart (NOVOLOG  FLEXPEN) 100 UNIT/ML FlexPen, DIAL  AND INJECT UNDER THE SKIN  THREE TIMES DAILY PER CORRECTION SCALE. MAX DAILY DOSE 50 UNITS, Disp: 15 mL, Rfl: 0   Insulin  Glargine (BASAGLAR  KWIKPEN) 100 UNIT/ML, Inject 40 Units into the skin at bedtime., Disp: 45 mL, Rfl: 2   Insulin  Pen Needle 32G X 4 MM MISC, 1 Device by Does not apply route in the morning, at noon, in the evening, and at bedtime., Disp: 400 each, Rfl: 3   Lancets (ONETOUCH DELICA PLUS LANCET33G) MISC, USE   TO CHECK GLUCOSE 4 TIMES DAILY, Disp: 100 each, Rfl: 3   latanoprost  (XALATAN ) 0.005 % ophthalmic solution, Place 1 drop into both eyes at bedtime., Disp: , Rfl:    levothyroxine  (SYNTHROID , LEVOTHROID) 75 MCG tablet, Take 75 mcg by mouth daily before breakfast., Disp: , Rfl:    LUMIGAN 0.01 % SOLN, Place 1 drop into both eyes at bedtime., Disp: , Rfl:    Multiple Vitamins-Minerals (ALIVE  ONCE DAILY WOMENS 50+ PO), Take 1 tablet by mouth daily., Disp: , Rfl:    Omega-3 Fatty Acids (FISH OIL) 1200 MG CAPS, Take 1,200 mg by mouth at bedtime. Taking 2 capsules at night, Disp: , Rfl:    ondansetron  (ZOFRAN ) 4 MG tablet, Take 1 tablet (4 mg total) by mouth every 8 (eight) hours as needed for nausea or vomiting., Disp: 20 tablet, Rfl: 0   spironolactone  (ALDACTONE ) 25 MG tablet, Take 1 tablet (25 mg total) by mouth daily., Disp: 90 tablet, Rfl: 3   torsemide  (DEMADEX ) 20 MG tablet, Take 2 tablets (40 mg total) by mouth daily. May take extra 40 mg as needed for weight gain, increased swelling, or increased shortness of breath, Disp: 190 tablet, Rfl: 3   Travoprost, BAK Free, (TRAVATAN) 0.004 % SOLN ophthalmic solution, Place 1 drop into both eyes at bedtime., Disp: , Rfl:    insulin  lispro (HUMALOG  KWIKPEN) 100 UNIT/ML KwikPen, Take HUmalog  8 units with each meal PLUS Humalog  correctional insulin  scale max daily dose 50 units (Patient not taking: Reported on 11/21/2023), Disp: 30 mL, Rfl: 3   Insulin  Lispro (HUMALOG  [place]), Inject 8 Units into the skin in the morning, at noon, and at bedtime., Disp: , Rfl:   Allergies  Allergen Reactions   Codeine Other (See Comments)   Other Other (See Comments)    Blood Product Refusal (Jehovah's witness)     Tape Other (See Comments)    Leaves blisters and marks on the skin   Sulfa Antibiotics Rash   Uloric  [Febuxostat ] Rash     Social History   Socioeconomic History   Marital status: Widowed    Spouse name: Not on file   Number of children: 3   Years of education: Not on file   Highest education level: Some college, no degree  Occupational History   Occupation: retired  Tobacco Use   Smoking status: Former    Current packs/day: 0.00    Average packs/day: 1 pack/day for 22.0 years (22.0 ttl pk-yrs)    Types: Cigarettes    Start date: 59    Quit date: 20    Years since quitting: 42.5   Smokeless tobacco: Never  Vaping Use   Vaping status: Never Used  Substance and Sexual Activity   Alcohol use: Yes    Comment: 05/22/2018 glass of wine couple times/wk   Drug use: Not Currently   Sexual activity: Not Currently    Birth control/protection: None, Post-menopausal  Other Topics Concern   Not on file  Social History Narrative   Not on file   Social Drivers of Health   Financial Resource Strain: Medium Risk (12/22/2021)   Overall Financial Resource Strain (CARDIA)    Difficulty of Paying Living Expenses: Somewhat hard  Food Insecurity: No Food Insecurity (05/08/2018)   Hunger Vital Sign    Worried About Running Out of Food in the Last Year: Never true    Ran Out of Food in the Last Year: Never true  Transportation Needs: Unmet Transportation Needs (05/08/2022)   PRAPARE - Administrator, Civil Service (Medical): Yes    Lack of Transportation (Non-Medical): Yes  Physical Activity: Not on file  Stress: Not on file  Social Connections: Not on file  Intimate Partner Violence: Not on file    Physical Exam      Future Appointments  Date Time Provider Department Center  12/02/2023  9:15 AM Joya Stabs, DPM  [facility]  01/14/2024  7:05 AM [facility] DEVICE REMOTES [facility]  04/14/2024  7:05 AM [facility] DEVICE REMOTES [facility]  07/14/2024  7:05 AM [facility] DEVICE REMOTES [facility]  10/13/2024  7:05 AM [facility] DEVICE REMOTES [facility]  01/12/2025  7:05 AM [facility] DEVICE REMOTES [facility]  04/13/2025  7:05 AM [facility] DEVICE REMOTES [facility]  07/13/2025  7:05 AM [facility] DEVICE REMOTES [facility]

## 2023-11-28 NOTE — Telephone Encounter (Signed)
 Reached out to [employer] spoke to the representative he states a new P/A will have to be submitted because the time frame of 65 days to submit an expedited appeal has passed and we are at 69 days. New P/A submitted. The prior authorization you submitted, Case [id number], has been received. Review Date: 11/28/2023 11:31:

## 2023-11-28 NOTE — Addendum Note (Signed)
 Addended by: JOSHUA DALTON MATSU on: 11/28/2023 11:39 AM   Modules accepted: Orders

## 2023-12-02 ENCOUNTER — Encounter: Payer: Self-pay | Admitting: Podiatry

## 2023-12-02 ENCOUNTER — Ambulatory Visit: Admitting: Podiatry

## 2023-12-02 ENCOUNTER — Ambulatory Visit (INDEPENDENT_AMBULATORY_CARE_PROVIDER_SITE_OTHER)

## 2023-12-02 DIAGNOSIS — L97522 Non-pressure chronic ulcer of other part of left foot with fat layer exposed: Secondary | ICD-10-CM | POA: Diagnosis not present

## 2023-12-02 DIAGNOSIS — E1151 Type 2 diabetes mellitus with diabetic peripheral angiopathy without gangrene: Secondary | ICD-10-CM

## 2023-12-02 MED ORDER — TRAMADOL HCL 50 MG PO TABS: 50.0000 mg | ORAL_TABLET | Freq: Three times a day (TID) | ORAL | 0 refills | Status: AC | PRN

## 2023-12-02 NOTE — Progress Notes (Addendum)
 Subjective:  Patient ID: Danielle [name], female    DOB: 04-30-1946,  MRN: [id number]  Chief Complaint  Patient presents with   Wound Check    F/U ulcer Left great toe. Dressing changes. Pt. States wound has a smell. She has beed keeping clean. IDDM A1C . 8.0    78 y.o. female returns for follow-up of left great toe wound.  Has missed last follow-up  Relates doing ok. SABRA Has been dressing the great toe as instructed.   Review of Systems: Negative except as noted in the HPI. Denies N/V/F/Ch.  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HCC) 02/05/2017   Acute gout due to renal impairment involving right wrist 05/20/2018   Acute kidney failure (HCC) 05/15/2018   Arthritis    all over (05/22/2018)   Blood dyscrasia    per pt-has small blood cells-appears as if anemic   Breast cancer, left breast (HCC) 1985   CHF (congestive heart failure) (HCC)    Complication of anesthesia    difficult to awaken from per pt   Coronary artery disease    Dyspnea    Essential hypertension    Heart disease    Heart murmur    History of gout    Hypothyroidism    Obesity (BMI 30-39.9) 11/11/2017   OSA (obstructive sleep apnea) 02/18/2017    severe obstructive sleep apnea with an AHI of 75.6/h and mild central sleep apnea with a CAI of 7.7/h.  Oxygen saturations dropped as low as 82%.   He is on CPAP at 9 cm H2O.   OSA on CPAP    Personal history of chemotherapy    Personal history of radiation therapy    Presence of permanent cardiac pacemaker    Primary localized osteoarthritis of right knee 05/13/2018   Refusal of blood transfusions as patient is Jehovah's Witness    Type II diabetes mellitus (HCC)     Current Outpatient Medications:    acetaminophen  (TYLENOL ) 500 MG tablet, Take 500-1,000 mg by mouth every 6 (six) hours as needed (for pain or headaches)., Disp: , Rfl:    allopurinol  (ZYLOPRIM ) 100 MG tablet, Take 100 mg by mouth  daily., Disp: , Rfl:    apixaban  (ELIQUIS ) 5 MG TABS tablet, Take 1 tablet (5 mg total) by mouth 2 (two) times daily., Disp: 180 tablet, Rfl: 3   atorvastatin  (LIPITOR ) 80 MG tablet, Take 1 tablet (80 mg total) by mouth at bedtime., Disp: 100 tablet, Rfl: 3   B Complex-Biotin -FA (B-COMPLEX PO), Take 1 tablet by mouth daily., Disp: , Rfl:    Biotin  10 MG TABS, Take 10 mg by mouth daily., Disp: , Rfl:    Blood Glucose Monitoring Suppl (ONETOUCH VERIO) w/Device KIT, Use to check blood sugar, Disp: 1 kit, Rfl: 0   carvedilol  (COREG ) 3.125 MG tablet, Take 1 tablet (3.125 mg total) by mouth 2 (two) times daily with a meal., Disp: 180 tablet, Rfl: 3   cetirizine (ZYRTEC) 10 MG tablet, Take 10 mg by mouth daily., Disp: , Rfl:    Cinnamon 500 MG capsule, Take 500 mg by mouth daily., Disp: , Rfl:    clotrimazole-betamethasone (LOTRISONE) lotion, Apply 1 application topically 2 (two) times daily as needed (itching)., Disp: , Rfl:    Continuous Glucose Sensor (DEXCOM G6 SENSOR) MISC, 1 Device by Does not apply route as directed., Disp: 9 each, Rfl: 3   Continuous Glucose Transmitter (DEXCOM G6 TRANSMITTER) MISC, Change every 90  days, Disp: 1 each, Rfl: 3   empagliflozin  (JARDIANCE ) 10 MG TABS tablet, Take 1 tablet (10 mg total) by mouth daily., Disp: 90 tablet, Rfl: 3   ferrous sulfate  325 (65 FE) MG tablet, Take 1 tablet (325 mg total) by mouth 3 (three) times a week., Disp: 45 tablet, Rfl: 3   folic acid  (FOLVITE ) 1 MG tablet, Take 1 tablet by mouth once daily, Disp: 90 tablet, Rfl: 3   gabapentin  (NEURONTIN ) 300 MG capsule, Take 300 mg by mouth 2 (two) times daily., Disp: , Rfl:    glucose blood (ONETOUCH VERIO) test strip, Check blood sugar 2 times daily, Disp: 100 each, Rfl: 12   insulin  aspart (NOVOLOG  FLEXPEN) 100 UNIT/ML FlexPen, DIAL  AND INJECT UNDER THE SKIN THREE TIMES DAILY PER CORRECTION SCALE. MAX DAILY DOSE 50 UNITS, Disp: 15 mL, Rfl: 0   Insulin  Glargine (BASAGLAR  KWIKPEN) 100 UNIT/ML, Inject  40 Units into the skin at bedtime., Disp: 45 mL, Rfl: 2   insulin  lispro (HUMALOG  KWIKPEN) 100 UNIT/ML KwikPen, Take HUmalog  8 units with each meal PLUS Humalog  correctional insulin  scale max daily dose 50 units (Patient not taking: Reported on 11/21/2023), Disp: 30 mL, Rfl: 3   Insulin  Lispro (HUMALOG  [place]), Inject 8 Units into the skin in the morning, at noon, and at bedtime., Disp: , Rfl:    Insulin  Pen Needle 32G X 4 MM MISC, 1 Device by Does not apply route in the morning, at noon, in the evening, and at bedtime., Disp: 400 each, Rfl: 3   Lancets (ONETOUCH DELICA PLUS LANCET33G) MISC, USE   TO CHECK GLUCOSE 4 TIMES DAILY, Disp: 100 each, Rfl: 3   latanoprost  (XALATAN ) 0.005 % ophthalmic solution, Place 1 drop into both eyes at bedtime., Disp: , Rfl:    levothyroxine  (SYNTHROID , LEVOTHROID) 75 MCG tablet, Take 75 mcg by mouth daily before breakfast., Disp: , Rfl:    LUMIGAN 0.01 % SOLN, Place 1 drop into both eyes at bedtime., Disp: , Rfl:    Multiple Vitamins-Minerals (ALIVE ONCE DAILY WOMENS 50+ PO), Take 1 tablet by mouth daily., Disp: , Rfl:    Omega-3 Fatty Acids (FISH OIL) 1200 MG CAPS, Take 1,200 mg by mouth at bedtime. Taking 2 capsules at night, Disp: , Rfl:    ondansetron  (ZOFRAN ) 4 MG tablet, Take 1 tablet (4 mg total) by mouth every 8 (eight) hours as needed for nausea or vomiting., Disp: 20 tablet, Rfl: 0   spironolactone  (ALDACTONE ) 25 MG tablet, Take 1 tablet (25 mg total) by mouth daily., Disp: 90 tablet, Rfl: 3   torsemide  (DEMADEX ) 20 MG tablet, Take 2 tablets (40 mg total) by mouth daily. May take extra 40 mg as needed for weight gain, increased swelling, or increased shortness of breath, Disp: 190 tablet, Rfl: 3   Travoprost, BAK Free, (TRAVATAN) 0.004 % SOLN ophthalmic solution, Place 1 drop into both eyes at bedtime., Disp: , Rfl:   Social History   Tobacco Use  Smoking Status Former   Current packs/day: 0.00   Average packs/day: 1 pack/day for 22.0 years (22.0 ttl pk-yrs)    Types: Cigarettes   Start date: 60   Quit date: 59   Years since quitting: 42.5  Smokeless Tobacco Never    Allergies  Allergen Reactions   Codeine Other (See Comments)   Other Other (See Comments)    Blood Product Refusal (Jehovah's witness)     Tape Other (See Comments)    Leaves blisters and marks on the skin   Sulfa  Antibiotics Rash   Uloric  [Febuxostat ] Rash   Objective:  Vascular: DP/PT pulses 2/4 bilateral. CFT <3 seconds. Absent hair growth on digits. Edema noted to bilateral lower extremities. Xerosis noted bilaterally.  Skin. No lacerations or abrasions bilateral feet. Nails 1-5 bilateral  are thickened discolored and elongated with subungual debris. Left second digit amputation . Left hallux with hyperkeratosis noted and upon debridedment ulceration noted to plantar hallux with granular base. Nearly healed. No erythema edema or purulence noted. No probe to bone Musculoskeletal: MMT 5/5 bilateral lower extremities in DF, PF, Inversion and Eversion. Deceased ROM in DF of ankle joint. Left second digit amputation.  No tenderness erythema or edema noted to the great toe currently.  Neurological: Sensation intact to light touch. Protective sensation diminished bilateral.   Assessment:   No diagnosis found.         Plan:  Patient was evaluated and treated and all questions answered. Ulcer left hallux with fat layer exposed   X-ray reviewed. No obvious erosion noted. Small area plantar hallux will keep an eye on.   -Debridement as below. -Dressed with hydrofera blue, DSD. -Off-loading with surgical shoe and offloading padding.  -Has DM shoes but advised to hold off until we get this healed.  -No abx indicated.  -Tramadol  sent for pain.  -Discussed glucose control and proper protein-rich diet.  -Discussed if any worsening redness, pain, fever or chills to call or may need to report to the emergency room. Patient expressed understanding.   Procedure:  Excisional Debridement of Wound Rationale: Removal of non-viable soft tissue from the wound to promote healing.  Anesthesia: none Pre-Debridement Wound Measurements: Overlying callus  Post-Debridement Wound Measurements: 0.5 cm x 0.3 cm x 0.2 cm  Type of Debridement: Sharp Excisional Tissue Removed: Non-viable soft tissue Depth of Debridement: subcutaneous tissue. Technique: Sharp excisional debridement to bleeding, viable wound base.  Dressing: Dry, sterile, compression dressing. Disposition: Patient tolerated procedure well. Patient to return in 2 week for follow-up.   No follow-ups on file.   No follow-ups on file.

## 2023-12-02 NOTE — Addendum Note (Signed)
 Addended by: Milika Ventress R on: 12/02/2023 10:42 AM   Modules accepted: Orders

## 2023-12-05 ENCOUNTER — Other Ambulatory Visit (HOSPITAL_COMMUNITY): Payer: Self-pay

## 2023-12-05 ENCOUNTER — Telehealth (HOSPITAL_COMMUNITY): Payer: Self-pay

## 2023-12-05 MED ORDER — FERROUS SULFATE 325 (65 FE) MG PO TABS: 325.0000 mg | ORAL_TABLET | ORAL | 3 refills | Status: AC

## 2023-12-05 NOTE — Telephone Encounter (Signed)
 HF Paramedicine Message to [facility]  Pharmacy (if applicable): [facility]    Issue/reason for call:  RX needs to be transferred   Medication refill?  Yes- Ferrous Sulfate  - prescribed by Cherrie Powell Mirza, EMT-Paramedic [phone] 12/05/2023

## 2023-12-05 NOTE — Progress Notes (Signed)
 Paramedicine Encounter    Patient ID: Danielle [name], female    DOB: 01-Jun-1945, 78 y.o.   MRN: [id number]   Complaints- none   Assessment- CAOX4, warm and dry ambulatory in the home with no shortness of breath, some mild edema on the left lower leg, lungs clear, weight stable, vitals within normal limits for her.   Compliance with meds- missed two doses (one morning and one evening)   Pill box filled- for one week   Refills needed- spironolactone , ferrous sulfate , zyrtec   Meds changes since last visit- none     Social changes- none    VISIT SUMMARY- Arrived for home visit for [facility] who reports to be feeling well with no complaints today. She reports that she has been having no shortness of breath, no dizziness. Her weigh its baseline for her today. She does have some lower leg swelling noted but only mild. Lungs clear. Vitals within normal. I advised her to be sure to be wearing her compression socks and keep her legs elevated and to limit her salt and fluid intake. She agreed. I replaced her dexcom. I reviewed meds and confirmed same filling one week of pill boxes. Refills called into [facility]. We discussed HF education and upcoming appointments. Home visit complete. I will see her in one week.   BP 110/62   Pulse 81   Resp 16   Wt 204 lb (92.5 kg)   SpO2 95%   BMI 35.02 kg/m  Weight yesterday-- did not weigh  Last visit weight-- 204lbs     ACTION: Home visit completed     Patient Care Team: Rexanne Ingle, MD as PCP - General (Internal Medicine) Inocencio Soyla Lunger, MD as PCP - Electrophysiology (Cardiology) Shlomo Wilbert SAUNDERS, MD as PCP - Sleep Medicine (Cardiology) Bensimhon, Toribio SAUNDERS, MD as PCP - Cardiology (Cardiology) Cathern Andriette DEL, LCSW as Social Worker (Licensed Clinical Social Worker) Shamleffer, Donell Cardinal, MD as Consulting Physician (Endocrinology)  Patient Active Problem List   Diagnosis Date Noted   Chronic systolic heart failure (HCC)  87/87/7976   Type 2 diabetes mellitus with stage 3a chronic kidney disease, with long-term current use of insulin  (HCC) 02/16/2022   Prosthetic joint implant failure, initial encounter (HCC) 12/20/2021   Pseudophakia of both eyes 11/15/2021   DVT (deep venous thrombosis) (HCC) 07/31/2021   Acute blood loss anemia    Septic Arthritis, Infection of prosthetic right knee joint (HCC), MSSA infection 07/10/2021   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) 07/10/2021   CAD (coronary artery disease) 06/08/2021   Carotid artery disease (HCC) 06/08/2021   Symptomatic bradycardia 06/08/2021   Paroxysmal atrial fibrillation (HCC) 06/08/2021   CKD (chronic kidney disease), stage III (HCC) 06/08/2021   Polyneuropathy associated with underlying disease (HCC) 05/04/2020   Severe nonproliferative diabetic retinopathy of left eye, with macular edema, associated with type 2 diabetes mellitus (HCC) 11/05/2019   Severe nonproliferative diabetic retinopathy of right eye, with macular edema, associated with type 2 diabetes mellitus (HCC) 11/05/2019   Retinal hemorrhage of right eye 11/05/2019   Retinal hemorrhage of left eye 11/05/2019   Diabetes mellitus (HCC) 05/14/2019   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, with long-term current use of insulin  (HCC) 05/14/2019   Type 2 diabetes mellitus with diabetic polyneuropathy, with long-term current use of insulin  (HCC) 05/14/2019   History of gout 05/20/2018   Primary localized osteoarthritis of right knee 05/13/2018   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) 12/20/2017   Obesity (BMI  30-39.9) 11/11/2017   OSA (obstructive sleep apnea) 02/18/2017   Essential hypertension    L Breast Cancer    Hypothyroidism     Current Outpatient Medications:    acetaminophen  (TYLENOL ) 500 MG tablet, Take 500-1,000 mg by mouth every 6 (six) hours as needed (for pain or headaches)., Disp: , Rfl:    allopurinol  (ZYLOPRIM ) 100 MG tablet, Take 100  mg by mouth daily., Disp: , Rfl:    apixaban  (ELIQUIS ) 5 MG TABS tablet, Take 1 tablet (5 mg total) by mouth 2 (two) times daily., Disp: 180 tablet, Rfl: 3   atorvastatin  (LIPITOR ) 80 MG tablet, Take 1 tablet (80 mg total) by mouth at bedtime., Disp: 100 tablet, Rfl: 3   B Complex-Biotin -FA (B-COMPLEX PO), Take 1 tablet by mouth daily., Disp: , Rfl:    Biotin  10 MG TABS, Take 10 mg by mouth daily., Disp: , Rfl:    Blood Glucose Monitoring Suppl (ONETOUCH VERIO) w/Device KIT, Use to check blood sugar, Disp: 1 kit, Rfl: 0   carvedilol  (COREG ) 3.125 MG tablet, Take 1 tablet (3.125 mg total) by mouth 2 (two) times daily with a meal., Disp: 180 tablet, Rfl: 3   cetirizine (ZYRTEC) 10 MG tablet, Take 10 mg by mouth daily., Disp: , Rfl:    Cinnamon 500 MG capsule, Take 500 mg by mouth daily., Disp: , Rfl:    clotrimazole-betamethasone (LOTRISONE) lotion, Apply 1 application topically 2 (two) times daily as needed (itching)., Disp: , Rfl:    Continuous Glucose Sensor (DEXCOM G6 SENSOR) MISC, 1 Device by Does not apply route as directed., Disp: 9 each, Rfl: 3   Continuous Glucose Transmitter (DEXCOM G6 TRANSMITTER) MISC, Change every 90 days, Disp: 1 each, Rfl: 3   empagliflozin  (JARDIANCE ) 10 MG TABS tablet, Take 1 tablet (10 mg total) by mouth daily., Disp: 90 tablet, Rfl: 3   ferrous sulfate  325 (65 FE) MG tablet, Take 1 tablet (325 mg total) by mouth 3 (three) times a week., Disp: 45 tablet, Rfl: 3   folic acid  (FOLVITE ) 1 MG tablet, Take 1 tablet by mouth once daily, Disp: 90 tablet, Rfl: 3   gabapentin  (NEURONTIN ) 300 MG capsule, Take 300 mg by mouth 2 (two) times daily., Disp: , Rfl:    glucose blood (ONETOUCH VERIO) test strip, Check blood sugar 2 times daily, Disp: 100 each, Rfl: 12   insulin  aspart (NOVOLOG  FLEXPEN) 100 UNIT/ML FlexPen, DIAL  AND INJECT UNDER THE SKIN THREE TIMES DAILY PER CORRECTION SCALE. MAX DAILY DOSE 50 UNITS, Disp: 15 mL, Rfl: 0   Insulin  Glargine (BASAGLAR  KWIKPEN) 100  UNIT/ML, Inject 40 Units into the skin at bedtime., Disp: 45 mL, Rfl: 2   insulin  lispro (HUMALOG  KWIKPEN) 100 UNIT/ML KwikPen, Take HUmalog  8 units with each meal PLUS Humalog  correctional insulin  scale max daily dose 50 units (Patient not taking: Reported on 11/21/2023), Disp: 30 mL, Rfl: 3   Insulin  Lispro (HUMALOG  [place]), Inject 8 Units into the skin in the morning, at noon, and at bedtime., Disp: , Rfl:    Insulin  Pen Needle 32G X 4 MM MISC, 1 Device by Does not apply route in the morning, at noon, in the evening, and at bedtime., Disp: 400 each, Rfl: 3   Lancets (ONETOUCH DELICA PLUS LANCET33G) MISC, USE   TO CHECK GLUCOSE 4 TIMES DAILY, Disp: 100 each, Rfl: 3   latanoprost  (XALATAN ) 0.005 % ophthalmic solution, Place 1 drop into both eyes at bedtime., Disp: , Rfl:    levothyroxine  (SYNTHROID , LEVOTHROID) 75 MCG tablet,  Take 75 mcg by mouth daily before breakfast., Disp: , Rfl:    LUMIGAN 0.01 % SOLN, Place 1 drop into both eyes at bedtime., Disp: , Rfl:    Multiple Vitamins-Minerals (ALIVE ONCE DAILY WOMENS 50+ PO), Take 1 tablet by mouth daily., Disp: , Rfl:    Omega-3 Fatty Acids (FISH OIL) 1200 MG CAPS, Take 1,200 mg by mouth at bedtime. Taking 2 capsules at night, Disp: , Rfl:    ondansetron  (ZOFRAN ) 4 MG tablet, Take 1 tablet (4 mg total) by mouth every 8 (eight) hours as needed for nausea or vomiting., Disp: 20 tablet, Rfl: 0   spironolactone  (ALDACTONE ) 25 MG tablet, Take 1 tablet (25 mg total) by mouth daily., Disp: 90 tablet, Rfl: 3   torsemide  (DEMADEX ) 20 MG tablet, Take 2 tablets (40 mg total) by mouth daily. May take extra 40 mg as needed for weight gain, increased swelling, or increased shortness of breath, Disp: 190 tablet, Rfl: 3   traMADol  (ULTRAM ) 50 MG tablet, Take 1 tablet (50 mg total) by mouth every 8 (eight) hours as needed for up to 5 days., Disp: 15 tablet, Rfl: 0   Travoprost, BAK Free, (TRAVATAN) 0.004 % SOLN ophthalmic solution, Place 1 drop into both eyes at bedtime.,  Disp: , Rfl:  Allergies  Allergen Reactions   Codeine Other (See Comments)   Other Other (See Comments)    Blood Product Refusal (Jehovah's witness)     Tape Other (See Comments)    Leaves blisters and marks on the skin   Sulfa Antibiotics Rash   Uloric  [Febuxostat ] Rash     Social History   Socioeconomic History   Marital status: Widowed    Spouse name: Not on file   Number of children: 3   Years of education: Not on file   Highest education level: Some college, no degree  Occupational History   Occupation: retired  Tobacco Use   Smoking status: Former    Current packs/day: 0.00    Average packs/day: 1 pack/day for 22.0 years (22.0 ttl pk-yrs)    Types: Cigarettes    Start date: 70    Quit date: 1983    Years since quitting: 42.5   Smokeless tobacco: Never  Vaping Use   Vaping status: Never Used  Substance and Sexual Activity   Alcohol use: Yes    Comment: 05/22/2018 glass of wine couple times/wk   Drug use: Not Currently   Sexual activity: Not Currently    Birth control/protection: None, Post-menopausal  Other Topics Concern   Not on file  Social History Narrative   Not on file   Social Drivers of Health   Financial Resource Strain: Medium Risk (12/22/2021)   Overall Financial Resource Strain (CARDIA)    Difficulty of Paying Living Expenses: Somewhat hard  Food Insecurity: No Food Insecurity (05/08/2018)   Hunger Vital Sign    Worried About Running Out of Food in the Last Year: Never true    Ran Out of Food in the Last Year: Never true  Transportation Needs: Unmet Transportation Needs (05/08/2022)   PRAPARE - Administrator, Civil Service (Medical): Yes    Lack of Transportation (Non-Medical): Yes  Physical Activity: Not on file  Stress: Not on file  Social Connections: Not on file  Intimate Partner Violence: Not on file    Physical Exam      Future Appointments  Date Time Provider Department Center  12/30/2023  9:15 AM Joya Stabs, DPM [facility]  01/14/2024  7:05 AM [facility]  04/14/2024  7:05 AM [facility] DEVICE [facility]  07/14/2024  7:05 AM [facility]ICE [facility]  10/13/2024  7:05 AM [facility] DEVICE [facility]  01/12/2025  7:05 AM [facility] DEVICE [facility]  04/13/2025  7:05 AM [facility] DEVICE [facility]  07/13/2025  7:05 AM [facility] DEVICE [facility]

## 2023-12-05 NOTE — Telephone Encounter (Addendum)
 Cpap titration resubmitted to [facility] Case Number: [id number].  Ok to schedule TITRATION valid dates are 180 DAYS - June 06 2024-aut #[id number]- [phone]

## 2023-12-05 NOTE — Addendum Note (Signed)
 Addended by: BUELL POWELL HERO on: 12/05/2023 03:12 PM   Modules accepted: Orders

## 2023-12-05 NOTE — Telephone Encounter (Signed)
 Rx sent.

## 2023-12-09 DIAGNOSIS — Z853 Personal history of malignant neoplasm of breast: Secondary | ICD-10-CM | POA: Diagnosis not present

## 2023-12-09 DIAGNOSIS — Z01419 Encounter for gynecological examination (general) (routine) without abnormal findings: Secondary | ICD-10-CM | POA: Diagnosis not present

## 2023-12-10 NOTE — Addendum Note (Signed)
 Addended by: VICCI SELLER A on: 12/10/2023 08:42 AM   Modules accepted: Orders

## 2023-12-10 NOTE — Progress Notes (Signed)
 Remote pacemaker transmission.

## 2023-12-12 ENCOUNTER — Other Ambulatory Visit (HOSPITAL_COMMUNITY): Payer: Self-pay

## 2023-12-12 NOTE — Progress Notes (Signed)
 Paramedicine Encounter    Patient ID: Danielle [name], female    DOB: 23-Aug-1945, 78 y.o.   MRN: [id number]   Complaints- joint pain, ear pain with vertigo symptoms   Assessment- CAOx4, warm and dry, lower leg edema noted, lungs clear, complaining of joint pain and vertigo symptoms with ear pain. Vitals as noted. Weight stable.   Compliance with meds- missed three morning doses, missed two night doses   Pill box filled- for one week   Refills needed- none   Meds changes since last visit- none     Social changes- none    VISIT SUMMARY- Arrived for home visit for [facility] who reports to be having joint pain, with lower leg swelling. She denied shortness of breath or chest pain. I obtained vitals and assessment. She states she is having joint pain related to her gout. She missed a few doses of her meds which is contributing to same. I reviewed importance of taking meds each time of day and all meds. She agreed and said she will do better. I reviewed meds and confirmed same. Dr. Bensimhon approved her to take extra torsemide  if needed so we added an extra torsemide  for the next few days to assist with fluid build up. She agreed. We reviewed HF education and upcoming appointments. Home visit complete. I will see her in one week.   BP 102/62   Pulse 75   Resp 16   Wt 202 lb (91.6 kg)   SpO2 96%   BMI 34.67 kg/m  Weight yesterday-- 201lbs  Last visit weight-- 202lbs   CBG- 201 (had insulin  this morning)    ACTION: Home visit completed     Patient Care Team: Rexanne Ingle, MD as PCP - General (Internal Medicine) Inocencio Soyla Lunger, MD as PCP - Electrophysiology (Cardiology) Shlomo Wilbert SAUNDERS, MD as PCP - Sleep Medicine (Cardiology) Bensimhon, Toribio SAUNDERS, MD as PCP - Cardiology (Cardiology) Cathern Andriette DEL, LCSW as Social Worker (Licensed Clinical Social Worker) Shamleffer, Donell Cardinal, MD as Consulting Physician (Endocrinology)  Patient Active Problem List   Diagnosis Date  Noted   Chronic systolic heart failure (HCC) 05/08/2022   Type 2 diabetes mellitus with stage 3a chronic kidney disease, with long-term current use of insulin  (HCC) 02/16/2022   Prosthetic joint implant failure, initial encounter (HCC) 12/20/2021   Pseudophakia of both eyes 11/15/2021   DVT (deep venous thrombosis) (HCC) 07/31/2021   Acute blood loss anemia    Septic Arthritis, Infection of prosthetic right knee joint (HCC), MSSA infection 07/10/2021   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) 07/10/2021   CAD (coronary artery disease) 06/08/2021   Carotid artery disease (HCC) 06/08/2021   Symptomatic bradycardia 06/08/2021   Paroxysmal atrial fibrillation (HCC) 06/08/2021   CKD (chronic kidney disease), stage III (HCC) 06/08/2021   Polyneuropathy associated with underlying disease (HCC) 05/04/2020   Severe nonproliferative diabetic retinopathy of left eye, with macular edema, associated with type 2 diabetes mellitus (HCC) 11/05/2019   Severe nonproliferative diabetic retinopathy of right eye, with macular edema, associated with type 2 diabetes mellitus (HCC) 11/05/2019   Retinal hemorrhage of right eye 11/05/2019   Retinal hemorrhage of left eye 11/05/2019   Diabetes mellitus (HCC) 05/14/2019   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, with long-term current use of insulin  (HCC) 05/14/2019   Type 2 diabetes mellitus with diabetic polyneuropathy, with long-term current use of insulin  (HCC) 05/14/2019   History of gout 05/20/2018   Primary localized osteoarthritis of right knee 05/13/2018  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) 12/20/2017   Obesity (BMI 30-39.9) 11/11/2017   OSA (obstructive sleep apnea) 02/18/2017   Essential hypertension    L Breast Cancer    Hypothyroidism     Current Outpatient Medications:    acetaminophen  (TYLENOL ) 500 MG tablet, Take 500-1,000 mg by mouth every 6 (six) hours as needed (for pain or headaches)., Disp: , Rfl:     allopurinol  (ZYLOPRIM ) 100 MG tablet, Take 100 mg by mouth daily., Disp: , Rfl:    apixaban  (ELIQUIS ) 5 MG TABS tablet, Take 1 tablet (5 mg total) by mouth 2 (two) times daily., Disp: 180 tablet, Rfl: 3   atorvastatin  (LIPITOR ) 80 MG tablet, Take 1 tablet (80 mg total) by mouth at bedtime., Disp: 100 tablet, Rfl: 3   B Complex-Biotin -FA (B-COMPLEX PO), Take 1 tablet by mouth daily., Disp: , Rfl:    Biotin  10 MG TABS, Take 10 mg by mouth daily., Disp: , Rfl:    Blood Glucose Monitoring Suppl (ONETOUCH VERIO) w/Device KIT, Use to check blood sugar, Disp: 1 kit, Rfl: 0   carvedilol  (COREG ) 3.125 MG tablet, Take 1 tablet (3.125 mg total) by mouth 2 (two) times daily with a meal., Disp: 180 tablet, Rfl: 3   cetirizine (ZYRTEC) 10 MG tablet, Take 10 mg by mouth daily., Disp: , Rfl:    Cinnamon 500 MG capsule, Take 500 mg by mouth daily., Disp: , Rfl:    clotrimazole-betamethasone (LOTRISONE) lotion, Apply 1 application topically 2 (two) times daily as needed (itching)., Disp: , Rfl:    Continuous Glucose Sensor (DEXCOM G6 SENSOR) MISC, 1 Device by Does not apply route as directed., Disp: 9 each, Rfl: 3   Continuous Glucose Transmitter (DEXCOM G6 TRANSMITTER) MISC, Change every 90 days, Disp: 1 each, Rfl: 3   empagliflozin  (JARDIANCE ) 10 MG TABS tablet, Take 1 tablet (10 mg total) by mouth daily., Disp: 90 tablet, Rfl: 3   ferrous sulfate  325 (65 FE) MG tablet, Take 1 tablet (325 mg total) by mouth 3 (three) times a week., Disp: 45 tablet, Rfl: 3   folic acid  (FOLVITE ) 1 MG tablet, Take 1 tablet by mouth once daily, Disp: 90 tablet, Rfl: 3   gabapentin  (NEURONTIN ) 300 MG capsule, Take 300 mg by mouth 2 (two) times daily., Disp: , Rfl:    glucose blood (ONETOUCH VERIO) test strip, Check blood sugar 2 times daily, Disp: 100 each, Rfl: 12   insulin  aspart (NOVOLOG  FLEXPEN) 100 UNIT/ML FlexPen, DIAL  AND INJECT UNDER THE SKIN THREE TIMES DAILY PER CORRECTION SCALE. MAX DAILY DOSE 50 UNITS, Disp: 15 mL, Rfl: 0    Insulin  Glargine (BASAGLAR  KWIKPEN) 100 UNIT/ML, Inject 40 Units into the skin at bedtime., Disp: 45 mL, Rfl: 2   insulin  lispro (HUMALOG  KWIKPEN) 100 UNIT/ML KwikPen, Take HUmalog  8 units with each meal PLUS Humalog  correctional insulin  scale max daily dose 50 units (Patient not taking: Reported on 11/21/2023), Disp: 30 mL, Rfl: 3   Insulin  Lispro (HUMALOG  [place]), Inject 8 Units into the skin in the morning, at noon, and at bedtime., Disp: , Rfl:    Insulin  Pen Needle 32G X 4 MM MISC, 1 Device by Does not apply route in the morning, at noon, in the evening, and at bedtime., Disp: 400 each, Rfl: 3   Lancets (ONETOUCH DELICA PLUS LANCET33G) MISC, USE   TO CHECK GLUCOSE 4 TIMES DAILY, Disp: 100 each, Rfl: 3   latanoprost  (XALATAN ) 0.005 % ophthalmic solution, Place 1 drop into both eyes at bedtime.,  Disp: , Rfl:    levothyroxine  (SYNTHROID , LEVOTHROID) 75 MCG tablet, Take 75 mcg by mouth daily before breakfast., Disp: , Rfl:    LUMIGAN 0.01 % SOLN, Place 1 drop into both eyes at bedtime., Disp: , Rfl:    Multiple Vitamins-Minerals (ALIVE ONCE DAILY WOMENS 50+ PO), Take 1 tablet by mouth daily., Disp: , Rfl:    Omega-3 Fatty Acids (FISH OIL) 1200 MG CAPS, Take 1,200 mg by mouth at bedtime. Taking 2 capsules at night, Disp: , Rfl:    ondansetron  (ZOFRAN ) 4 MG tablet, Take 1 tablet (4 mg total) by mouth every 8 (eight) hours as needed for nausea or vomiting., Disp: 20 tablet, Rfl: 0   spironolactone  (ALDACTONE ) 25 MG tablet, Take 1 tablet (25 mg total) by mouth daily., Disp: 90 tablet, Rfl: 3   torsemide  (DEMADEX ) 20 MG tablet, Take 2 tablets (40 mg total) by mouth daily. May take extra 40 mg as needed for weight gain, increased swelling, or increased shortness of breath, Disp: 190 tablet, Rfl: 3   Travoprost, BAK Free, (TRAVATAN) 0.004 % SOLN ophthalmic solution, Place 1 drop into both eyes at bedtime., Disp: , Rfl:  Allergies  Allergen Reactions   Codeine Other (See Comments)   Other Other (See Comments)     Blood Product Refusal (Jehovah's witness)     Tape Other (See Comments)    Leaves blisters and marks on the skin   Sulfa Antibiotics Rash   Uloric  [Febuxostat ] Rash     Social History   Socioeconomic History   Marital status: Widowed    Spouse name: Not on file   Number of children: 3   Years of education: Not on file   Highest education level: Some college, no degree  Occupational History   Occupation: retired  Tobacco Use   Smoking status: Former    Current packs/day: 0.00    Average packs/day: 1 pack/day for 22.0 years (22.0 ttl pk-yrs)    Types: Cigarettes    Start date: 36    Quit date: 12    Years since quitting: 42.5   Smokeless tobacco: Never  Vaping Use   Vaping status: Never Used  Substance and Sexual Activity   Alcohol use: Yes    Comment: 05/22/2018 glass of wine couple times/wk   Drug use: Not Currently   Sexual activity: Not Currently    Birth control/protection: None, Post-menopausal  Other Topics Concern   Not on file  Social History Narrative   Not on file   Social Drivers of Health   Financial Resource Strain: Medium Risk (12/22/2021)   Overall Financial Resource Strain (CARDIA)    Difficulty of Paying Living Expenses: Somewhat hard  Food Insecurity: No Food Insecurity (05/08/2018)   Hunger Vital Sign    Worried About Running Out of Food in the Last Year: Never true    Ran Out of Food in the Last Year: Never true  Transportation Needs: Unmet Transportation Needs (05/08/2022)   PRAPARE - Administrator, Civil Service (Medical): Yes    Lack of Transportation (Non-Medical): Yes  Physical Activity: Not on file  Stress: Not on file  Social Connections: Not on file  Intimate Partner Violence: Not on file    Physical Exam      Future Appointments  Date Time Provider Department Center  12/30/2023  9:15 AM Joya Stabs, DPM [facility]  01/14/2024  7:05 AM [facility] DEVICE [facility]OTES [facility]  04/14/2024   7:05 AM [facility] DEVICE REMOTES  [facility]  07/14/2024  7:05 AM [facility] DEVICE REMOT[facility]  10/13/2024  7:05 AM [facility] DEVICE REMOT[facility]  01/12/2025  7:05 AM [facility] DEVICE REMOT[facility]  04/13/2025  7:05 AM [facility] DEVICE REMOT[facility]  07/13/2025  7:05 AM [facility] DEVICE REMOTES [facility]

## 2023-12-12 NOTE — Telephone Encounter (Signed)
 When should she schedule her titration sleep study? Or a follow up?   Powell Mirza, EMT-Paramedic [phone] 12/12/2023

## 2023-12-17 DIAGNOSIS — H608X3 Other otitis externa, bilateral: Secondary | ICD-10-CM | POA: Diagnosis not present

## 2023-12-19 ENCOUNTER — Other Ambulatory Visit (HOSPITAL_COMMUNITY): Payer: Self-pay

## 2023-12-19 DIAGNOSIS — E113412 Type 2 diabetes mellitus with severe nonproliferative diabetic retinopathy with macular edema, left eye: Secondary | ICD-10-CM | POA: Diagnosis not present

## 2023-12-19 DIAGNOSIS — E113491 Type 2 diabetes mellitus with severe nonproliferative diabetic retinopathy without macular edema, right eye: Secondary | ICD-10-CM | POA: Diagnosis not present

## 2023-12-19 DIAGNOSIS — H401132 Primary open-angle glaucoma, bilateral, moderate stage: Secondary | ICD-10-CM | POA: Diagnosis not present

## 2023-12-19 DIAGNOSIS — G4733 Obstructive sleep apnea (adult) (pediatric): Secondary | ICD-10-CM | POA: Diagnosis not present

## 2023-12-20 ENCOUNTER — Telehealth (HOSPITAL_COMMUNITY): Payer: Self-pay

## 2023-12-20 NOTE — Progress Notes (Signed)
 Paramedicine Encounter    Patient ID: Danielle [name], female    DOB: Dec 02, 1945, 78 y.o.   MRN: [id number]   Complaints- none   Assessment- CAOX4, warm and dry ambulating around her apartment using her walker. No shortness of breath, no chest pain, no dizziness. She does have some BLE more in the right leg than the left. I educated her on elevating her feet, using compression socks, minimizing her sodium and fluid intake. She agreed. Lungs clear. Vitals within normal for her.   Compliance with meds- missed one dose   Pill box filled- one week   Refills needed- carvedilol    Meds changes since last visit- none     Social changes-none   Dexcom replaced    VISIT SUMMARY- Arrived for home visit for Danielle [name] who reports to be feeling okay today. She denied any shortness of breath, chest pain, dizziness. She does have some BLE noted. Lungs clear. Vitals within normal for her. I reviewed HF education and spent time reminding her of sodium and fluid restrictions. She verbalized understanding. I reviewed meds and filled pill box. Refills called into [facility]. She is waiting for approval for sleep study. I will reach out to Leonard Molt at [facility]. She follows up with [facility] on Fri Aug 8th at 0945. Home visit complete. I will see her in one week.   BP 120/62   Pulse 76   Resp 16   Wt 202 lb (91.6 kg)   SpO2 90%   BMI 34.67 kg/m  Weight yesterday-- 203lbs  Last visit weight-- 202lbs     ACTION: Home visit completed     Patient Care Team: Rexanne Ingle, MD as PCP - General (Internal Medicine) Inocencio Soyla Lunger, MD as PCP - Electrophysiology (Cardiology) Shlomo Wilbert SAUNDERS, MD as PCP - Sleep Medicine (Cardiology) Bensimhon, Toribio SAUNDERS, MD as PCP - Cardiology (Cardiology) Cathern Andriette DEL, LCSW as Social Worker (Licensed Clinical Social Worker) Shamleffer, Donell Cardinal, MD as Consulting Physician (Endocrinology)  Patient Active Problem List   Diagnosis Date Noted    Chronic systolic heart failure (HCC) 05/08/2022   Type 2 diabetes mellitus with stage 3a chronic kidney disease, with long-term current use of insulin  (HCC) 02/16/2022   Prosthetic joint implant failure, initial encounter (HCC) 12/20/2021   Pseudophakia of both eyes 11/15/2021   DVT (deep venous thrombosis) (HCC) 07/31/2021   Acute blood loss anemia    Septic Arthritis, Infection of prosthetic right knee joint (HCC), MSSA infection 07/10/2021   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) 07/10/2021   CAD (coronary artery disease) 06/08/2021   Carotid artery disease (HCC) 06/08/2021   Symptomatic bradycardia 06/08/2021   Paroxysmal atrial fibrillation (HCC) 06/08/2021   CKD (chronic kidney disease), stage III (HCC) 06/08/2021   Polyneuropathy associated with underlying disease (HCC) 05/04/2020   Severe nonproliferative diabetic retinopathy of left eye, with macular edema, associated with type 2 diabetes mellitus (HCC) 11/05/2019   Severe nonproliferative diabetic retinopathy of right eye, with macular edema, associated with type 2 diabetes mellitus (HCC) 11/05/2019   Retinal hemorrhage of right eye 11/05/2019   Retinal hemorrhage of left eye 11/05/2019   Diabetes mellitus (HCC) 05/14/2019   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, with long-term current use of insulin  (HCC) 05/14/2019   Type 2 diabetes mellitus with diabetic polyneuropathy, with long-term current use of insulin  (HCC) 05/14/2019   History of gout 05/20/2018   Primary localized osteoarthritis of right knee 05/13/2018   HFmrEF (heart failure with mildly reduced  EF) 12/20/2017   Obesity (BMI 30-39.9) 11/11/2017   OSA (obstructive sleep apnea) 02/18/2017   Essential hypertension    L Breast Cancer    Hypothyroidism     Current Outpatient Medications:    acetaminophen  (TYLENOL ) 500 MG tablet, Take 500-1,000 mg by mouth every 6 (six) hours as needed (for pain or headaches)., Disp: , Rfl:     allopurinol  (ZYLOPRIM ) 100 MG tablet, Take 100 mg by mouth daily., Disp: , Rfl:    apixaban  (ELIQUIS ) 5 MG TABS tablet, Take 1 tablet (5 mg total) by mouth 2 (two) times daily., Disp: 180 tablet, Rfl: 3   atorvastatin  (LIPITOR ) 80 MG tablet, Take 1 tablet (80 mg total) by mouth at bedtime., Disp: 100 tablet, Rfl: 3   B Complex-Biotin -FA (B-COMPLEX PO), Take 1 tablet by mouth daily., Disp: , Rfl:    Biotin  10 MG TABS, Take 10 mg by mouth daily., Disp: , Rfl:    Blood Glucose Monitoring Suppl (ONETOUCH VERIO) w/Device KIT, Use to check blood sugar, Disp: 1 kit, Rfl: 0   carvedilol  (COREG ) 3.125 MG tablet, Take 1 tablet (3.125 mg total) by mouth 2 (two) times daily with a meal., Disp: 180 tablet, Rfl: 3   cetirizine (ZYRTEC) 10 MG tablet, Take 10 mg by mouth daily., Disp: , Rfl:    Cinnamon 500 MG capsule, Take 500 mg by mouth daily., Disp: , Rfl:    clotrimazole-betamethasone (LOTRISONE) lotion, Apply 1 application topically 2 (two) times daily as needed (itching)., Disp: , Rfl:    Continuous Glucose Sensor (DEXCOM G6 SENSOR) MISC, 1 Device by Does not apply route as directed., Disp: 9 each, Rfl: 3   Continuous Glucose Transmitter (DEXCOM G6 TRANSMITTER) MISC, Change every 90 days, Disp: 1 each, Rfl: 3   empagliflozin  (JARDIANCE ) 10 MG TABS tablet, Take 1 tablet (10 mg total) by mouth daily., Disp: 90 tablet, Rfl: 3   ferrous sulfate  325 (65 FE) MG tablet, Take 1 tablet (325 mg total) by mouth 3 (three) times a week., Disp: 45 tablet, Rfl: 3   folic acid  (FOLVITE ) 1 MG tablet, Take 1 tablet by mouth once daily, Disp: 90 tablet, Rfl: 3   gabapentin  (NEURONTIN ) 300 MG capsule, Take 300 mg by mouth 2 (two) times daily., Disp: , Rfl:    glucose blood (ONETOUCH VERIO) test strip, Check blood sugar 2 times daily, Disp: 100 each, Rfl: 12   insulin  aspart (NOVOLOG  FLEXPEN) 100 UNIT/ML FlexPen, DIAL  AND INJECT UNDER THE SKIN THREE TIMES DAILY PER CORRECTION SCALE. MAX DAILY DOSE 50 UNITS, Disp: 15 mL, Rfl: 0    Insulin  Glargine (BASAGLAR  KWIKPEN) 100 UNIT/ML, Inject 40 Units into the skin at bedtime., Disp: 45 mL, Rfl: 2   insulin  lispro (HUMALOG  KWIKPEN) 100 UNIT/ML KwikPen, Take HUmalog  8 units with each meal PLUS Humalog  correctional insulin  scale max daily dose 50 units (Patient not taking: Reported on 11/21/2023), Disp: 30 mL, Rfl: 3   Insulin  Lispro (HUMALOG  [place]), Inject 8 Units into the skin in the morning, at noon, and at bedtime., Disp: , Rfl:    Insulin  Pen Needle 32G X 4 MM MISC, 1 Device by Does not apply route in the morning, at noon, in the evening, and at bedtime., Disp: 400 each, Rfl: 3   Lancets (ONETOUCH DELICA PLUS LANCET33G) MISC, USE   TO CHECK GLUCOSE 4 TIMES DAILY, Disp: 100 each, Rfl: 3   latanoprost  (XALATAN ) 0.005 % ophthalmic solution, Place 1 drop into both eyes at bedtime., Disp: , Rfl:  levothyroxine  (SYNTHROID , LEVOTHROID) 75 MCG tablet, Take 75 mcg by mouth daily before breakfast., Disp: , Rfl:    LUMIGAN 0.01 % SOLN, Place 1 drop into both eyes at bedtime., Disp: , Rfl:    Multiple Vitamins-Minerals (ALIVE ONCE DAILY WOMENS 50+ PO), Take 1 tablet by mouth daily., Disp: , Rfl:    Omega-3 Fatty Acids (FISH OIL) 1200 MG CAPS, Take 1,200 mg by mouth at bedtime. Taking 2 capsules at night, Disp: , Rfl:    ondansetron  (ZOFRAN ) 4 MG tablet, Take 1 tablet (4 mg total) by mouth every 8 (eight) hours as needed for nausea or vomiting., Disp: 20 tablet, Rfl: 0   spironolactone  (ALDACTONE ) 25 MG tablet, Take 1 tablet (25 mg total) by mouth daily., Disp: 90 tablet, Rfl: 3   torsemide  (DEMADEX ) 20 MG tablet, Take 2 tablets (40 mg total) by mouth daily. May take extra 40 mg as needed for weight gain, increased swelling, or increased shortness of breath, Disp: 190 tablet, Rfl: 3   Travoprost, BAK Free, (TRAVATAN) 0.004 % SOLN ophthalmic solution, Place 1 drop into both eyes at bedtime., Disp: , Rfl:  Allergies  Allergen Reactions   Codeine Other (See Comments)   Other Other (See Comments)     Blood Product Refusal (Jehovah's witness)     Tape Other (See Comments)    Leaves blisters and marks on the skin   Sulfa Antibiotics Rash   Uloric  [Febuxostat ] Rash     Social History   Socioeconomic History   Marital status: Widowed    Spouse name: Not on file   Number of children: 3   Years of education: Not on file   Highest education level: Some college, no degree  Occupational History   Occupation: retired  Tobacco Use   Smoking status: Former    Current packs/day: 0.00    Average packs/day: 1 pack/day for 22.0 years (22.0 ttl pk-yrs)    Types: Cigarettes    Start date: 89    Quit date: 39    Years since quitting: 42.5   Smokeless tobacco: Never  Vaping Use   Vaping status: Never Used  Substance and Sexual Activity   Alcohol use: Yes    Comment: 05/22/2018 glass of wine couple times/wk   Drug use: Not Currently   Sexual activity: Not Currently    Birth control/protection: None, Post-menopausal  Other Topics Concern   Not on file  Social History Narrative   Not on file   Social Drivers of Health   Financial Resource Strain: Medium Risk (12/22/2021)   Overall Financial Resource Strain (CARDIA)    Difficulty of Paying Living Expenses: Somewhat hard  Food Insecurity: No Food Insecurity (05/08/2018)   Hunger Vital Sign    Worried About Running Out of Food in the Last Year: Never true    Ran Out of Food in the Last Year: Never true  Transportation Needs: Unmet Transportation Needs (05/08/2022)   PRAPARE - Administrator, Civil Service (Medical): Yes    Lack of Transportation (Non-Medical): Yes  Physical Activity: Not on file  Stress: Not on file  Social Connections: Not on file  Intimate Partner Violence: Not on file    Physical Exam      Future Appointments  Date Time Provider Department Center  12/30/2023  9:15 AM Joya Stabs, DPM [facility]  01/14/2024  7:05 AM [facility] DEVICE REMOTES [facility]  04/14/2024   7:05 AM [facility] DEVICE REMOT[facility]  07/14/2024  7:05  AM [facility] DEVICE REMOTES [facility]  10/13/2024  7:05 AM [facility] DEVICE REMOT[facility]  01/12/2025  7:05 AM [facility] DEVICE [facility]OT[facility]  04/13/2025  7:05 AM [facility] DEVICE REMOT[facility]  07/13/2025  7:05 AM [facility] DEVICE REMOTES [facility]

## 2023-12-20 NOTE — Telephone Encounter (Signed)
 I contacted [facility] to assist Danielle [name] in getting her sleep study scheduled- they advised that it was not yet approved by [facility] for her to complete a sleep study and they are awaiting their approve for this. I will forward to D. Jones to update her on same.   Powell Mirza, EMT-Paramedic [phone] 12/20/2023

## 2023-12-25 ENCOUNTER — Other Ambulatory Visit (HOSPITAL_COMMUNITY): Payer: Self-pay

## 2023-12-25 NOTE — Progress Notes (Signed)
 Paramedicine Encounter    Patient ID: Danielle [name], female    DOB: [birth year], 78 y.o.   MRN: [id number]  MED REC   Compliance with meds- NO MISSED DOSES   Pill box filled- ONE WEEK   Refills needed- NONE   Meds changes since last visit- NONE     Social changes- NONE   MED REVIEW WITH [employer].    VISIT SUMMARY- MED REC ONLY   Wt 202 lb (91.6 kg)   BMI 34.67 kg/m  Weight yesterday--202 Last visit weight-- 202   CBG- 319   ACTION: Home visit completed     Patient Care Team: Rexanne Ingle, MD as PCP - General (Internal Medicine) Inocencio Soyla Lunger, MD as PCP - Electrophysiology (Cardiology) Shlomo Wilbert SAUNDERS, MD as PCP - Sleep Medicine (Cardiology) Bensimhon, Toribio SAUNDERS, MD as PCP - Cardiology (Cardiology) Cathern Andriette DEL, LCSW as Social Worker (Licensed Clinical Social Worker) Shamleffer, Donell Cardinal, MD as Consulting Physician (Endocrinology)  Patient Active Problem List   Diagnosis Date Noted   Chronic systolic heart failure (HCC) 05/08/2022   Type 2 diabetes mellitus with stage 3a chronic kidney disease, with long-term current use of insulin  (HCC) 02/16/2022   Prosthetic joint implant failure, initial encounter (HCC) 12/20/2021   Pseudophakia of both eyes 11/15/2021   DVT (deep venous thrombosis) (HCC) 07/31/2021   Acute blood loss anemia    Septic Arthritis, Infection of prosthetic right knee joint (HCC), MSSA infection 07/10/2021   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) 07/10/2021   CAD (coronary artery disease) 06/08/2021   Carotid artery disease (HCC) 06/08/2021   Symptomatic bradycardia 06/08/2021   Paroxysmal atrial fibrillation (HCC) 06/08/2021   CKD (chronic kidney disease), stage III (HCC) 06/08/2021   Polyneuropathy associated with underlying disease (HCC) 05/04/2020   Severe nonproliferative diabetic retinopathy of left eye, with macular edema, associated with type 2 diabetes mellitus (HCC) 11/05/2019   Severe nonproliferative  diabetic retinopathy of right eye, with macular edema, associated with type 2 diabetes mellitus (HCC) 11/05/2019   Retinal hemorrhage of right eye 11/05/2019   Retinal hemorrhage of left eye 11/05/2019   Diabetes mellitus (HCC) 05/14/2019   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, with long-term current use of insulin  (HCC) 05/14/2019   Type 2 diabetes mellitus with diabetic polyneuropathy, with long-term current use of insulin  (HCC) 05/14/2019   History of gout 05/20/2018   Primary localized osteoarthritis of right knee 05/13/2018   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) 12/20/2017   Obesity (BMI 30-39.9) 11/11/2017   OSA (obstructive sleep apnea) 02/18/2017   Essential hypertension    L Breast Cancer    Hypothyroidism     Current Outpatient Medications:    acetaminophen  (TYLENOL ) 500 MG tablet, Take 500-1,000 mg by mouth every 6 (six) hours as needed (for pain or headaches)., Disp: , Rfl:    allopurinol  (ZYLOPRIM ) 100 MG tablet, Take 100 mg by mouth daily., Disp: , Rfl:    apixaban  (ELIQUIS ) 5 MG TABS tablet, Take 1 tablet (5 mg total) by mouth 2 (two) times daily., Disp: 180 tablet, Rfl: 3   atorvastatin  (LIPITOR ) 80 MG tablet, Take 1 tablet (80 mg total) by mouth at bedtime., Disp: 100 tablet, Rfl: 3   B Complex-Biotin -FA (B-COMPLEX PO), Take 1 tablet by mouth daily., Disp: , Rfl:    Biotin  10 MG TABS, Take 10 mg by mouth daily., Disp: , Rfl:    Blood Glucose Monitoring Suppl (ONETOUCH VERIO) w/Device KIT, Use to check blood sugar, Disp: 1  kit, Rfl: 0   carvedilol  (COREG ) 3.125 MG tablet, Take 1 tablet (3.125 mg total) by mouth 2 (two) times daily with a meal., Disp: 180 tablet, Rfl: 3   cetirizine (ZYRTEC) 10 MG tablet, Take 10 mg by mouth daily., Disp: , Rfl:    Cinnamon 500 MG capsule, Take 500 mg by mouth daily., Disp: , Rfl:    clotrimazole-betamethasone (LOTRISONE) lotion, Apply 1 application topically 2 (two) times daily as needed (itching)., Disp: , Rfl:     Continuous Glucose Sensor (DEXCOM G6 SENSOR) MISC, 1 Device by Does not apply route as directed., Disp: 9 each, Rfl: 3   Continuous Glucose Transmitter (DEXCOM G6 TRANSMITTER) MISC, Change every 90 days, Disp: 1 each, Rfl: 3   empagliflozin  (JARDIANCE ) 10 MG TABS tablet, Take 1 tablet (10 mg total) by mouth daily., Disp: 90 tablet, Rfl: 3   ferrous sulfate  325 (65 FE) MG tablet, Take 1 tablet (325 mg total) by mouth 3 (three) times a week., Disp: 45 tablet, Rfl: 3   folic acid  (FOLVITE ) 1 MG tablet, Take 1 tablet by mouth once daily, Disp: 90 tablet, Rfl: 3   gabapentin  (NEURONTIN ) 300 MG capsule, Take 300 mg by mouth 2 (two) times daily., Disp: , Rfl:    glucose blood (ONETOUCH VERIO) test strip, Check blood sugar 2 times daily, Disp: 100 each, Rfl: 12   insulin  aspart (NOVOLOG  FLEXPEN) 100 UNIT/ML FlexPen, DIAL  AND INJECT UNDER THE SKIN THREE TIMES DAILY PER CORRECTION SCALE. MAX DAILY DOSE 50 UNITS, Disp: 15 mL, Rfl: 0   Insulin  Glargine (BASAGLAR  KWIKPEN) 100 UNIT/ML, Inject 40 Units into the skin at bedtime., Disp: 45 mL, Rfl: 2   insulin  lispro (HUMALOG  KWIKPEN) 100 UNIT/ML KwikPen, Take HUmalog  8 units with each meal PLUS Humalog  correctional insulin  scale max daily dose 50 units (Patient not taking: Reported on 11/21/2023), Disp: 30 mL, Rfl: 3   Insulin  Lispro (HUMALOG  ), Inject 8 Units into the skin in the morning, at noon, and at bedtime., Disp: , Rfl:    Insulin  Pen Needle 32G X 4 MM MISC, 1 Device by Does not apply route in the morning, at noon, in the evening, and at bedtime., Disp: 400 each, Rfl: 3   Lancets (ONETOUCH DELICA PLUS LANCET33G) MISC, USE   TO CHECK GLUCOSE 4 TIMES DAILY, Disp: 100 each, Rfl: 3   latanoprost  (XALATAN ) 0.005 % ophthalmic solution, Place 1 drop into both eyes at bedtime., Disp: , Rfl:    levothyroxine  (SYNTHROID , LEVOTHROID) 75 MCG tablet, Take 75 mcg by mouth daily before breakfast., Disp: , Rfl:    LUMIGAN 0.01 % SOLN, Place 1 drop into both eyes at  bedtime., Disp: , Rfl:    Multiple Vitamins-Minerals (ALIVE ONCE DAILY WOMENS 50+ PO), Take 1 tablet by mouth daily., Disp: , Rfl:    Omega-3 Fatty Acids (FISH OIL) 1200 MG CAPS, Take 1,200 mg by mouth at bedtime. Taking 2 capsules at night, Disp: , Rfl:    ondansetron  (ZOFRAN ) 4 MG tablet, Take 1 tablet (4 mg total) by mouth every 8 (eight) hours as needed for nausea or vomiting., Disp: 20 tablet, Rfl: 0   spironolactone  (ALDACTONE ) 25 MG tablet, Take 1 tablet (25 mg total) by mouth daily., Disp: 90 tablet, Rfl: 3   torsemide  (DEMADEX ) 20 MG tablet, Take 2 tablets (40 mg total) by mouth daily. May take extra 40 mg as needed for weight gain, increased swelling, or increased shortness of breath, Disp: 190 tablet, Rfl: 3   Travoprost, BAK Free, (  TRAVATAN) 0.004 % SOLN ophthalmic solution, Place 1 drop into both eyes at bedtime., Disp: , Rfl:  Allergies  Allergen Reactions   Codeine Other (See Comments)   Other Other (See Comments)    Blood Product Refusal (Jehovah's witness)     Tape Other (See Comments)    Leaves blisters and marks on the skin   Sulfa Antibiotics Rash   Uloric  [Febuxostat ] Rash     Social History   Socioeconomic History   Marital status: Widowed    Spouse name: Not on file   Number of children: 3   Years of education: Not on file   Highest education level: Some college, no degree  Occupational History   Occupation: retired  Tobacco Use   Smoking status: Former    Current packs/day: 0.00    Average packs/day: 1 pack/day for 22.0 years (22.0 ttl pk-yrs)    Types: Cigarettes    Start date: 98    Quit date: 66    Years since quitting: 42.6   Smokeless tobacco: Never  Vaping Use   Vaping status: Never Used  Substance and Sexual Activity   Alcohol use: Yes    Comment: 05/22/2018 glass of wine couple times/wk   Drug use: Not Currently   Sexual activity: Not Currently    Birth control/protection: None, Post-menopausal  Other Topics Concern   Not on file   Social History Narrative   Not on file   Social Drivers of Health   Financial Resource Strain: Medium Risk (12/22/2021)   Overall Financial Resource Strain (CARDIA)    Difficulty of Paying Living Expenses: Somewhat hard  Food Insecurity: No Food Insecurity (05/08/2018)   Hunger Vital Sign    Worried About Running Out of Food in the Last Year: Never true    Ran Out of Food in the Last Year: Never true  Transportation Needs: Unmet Transportation Needs (05/08/2022)   PRAPARE - Administrator, Civil Service (Medical): Yes    Lack of Transportation (Non-Medical): Yes  Physical Activity: Not on file  Stress: Not on file  Social Connections: Not on file  Intimate Partner Violence: Not on file    Physical Exam      Future Appointments  Date Time Provider Department Center  12/30/2023  9:15 AM Joya Stabs, DPM [facility]  01/14/2024  7:05 AM [facility]  04/14/2024  7:05 AM [facility]  07/14/2024  7:05 AM [facility]  10/13/2024  7:05 AM [facility]  01/12/2025  7:05 AM [facility]  04/13/2025  7:05 AM [facility]  07/13/2025  7:05 AM [facility]

## 2023-12-26 DIAGNOSIS — H608X3 Other otitis externa, bilateral: Secondary | ICD-10-CM | POA: Diagnosis not present

## 2023-12-30 ENCOUNTER — Encounter: Payer: Self-pay | Admitting: Podiatry

## 2023-12-30 ENCOUNTER — Ambulatory Visit: Admitting: Podiatry

## 2023-12-30 VITALS — BP 140/76 | HR 72

## 2023-12-30 DIAGNOSIS — M79674 Pain in right toe(s): Secondary | ICD-10-CM | POA: Diagnosis not present

## 2023-12-30 DIAGNOSIS — E1151 Type 2 diabetes mellitus with diabetic peripheral angiopathy without gangrene: Secondary | ICD-10-CM

## 2023-12-30 DIAGNOSIS — B351 Tinea unguium: Secondary | ICD-10-CM | POA: Diagnosis not present

## 2023-12-30 DIAGNOSIS — M79675 Pain in left toe(s): Secondary | ICD-10-CM | POA: Diagnosis not present

## 2023-12-30 DIAGNOSIS — L97522 Non-pressure chronic ulcer of other part of left foot with fat layer exposed: Secondary | ICD-10-CM

## 2023-12-30 DIAGNOSIS — E119 Type 2 diabetes mellitus without complications: Secondary | ICD-10-CM | POA: Diagnosis not present

## 2023-12-30 NOTE — Progress Notes (Signed)
 Subjective:  Patient ID: Danielle [name], female    DOB: Dec 27, 1945,  MRN: [id number]  Chief Complaint  Patient presents with   Diabetic Ulcer    To me, it's doing good.      78 y.o. female returns for follow-up of left great toe wound.   Relates doing ok. SABRA Has been dressing the great toe as instructed.  Also concern of thickened elongated and painful nails that are difficult to trim. Requesting to have them trimmed today. Relates burning and tingling in their feet. Patient is diabetic and last A1c was  Lab Results  Component Value Date   HGBA1C 10.2 (A) 11/09/2022   .   PCP:  Rexanne Ingle, MD      Review of Systems: Negative except as noted in the HPI. Denies N/V/F/Ch.  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HCC) 02/05/2017   Acute gout due to renal impairment involving right wrist 05/20/2018   Acute kidney failure (HCC) 05/15/2018   Arthritis    all over (05/22/2018)   Blood dyscrasia    per pt-has small blood cells-appears as if anemic   Breast cancer, left breast (HCC) 1985   CHF (congestive heart failure) (HCC)    Complication of anesthesia    difficult to awaken from per pt   Coronary artery disease    Dyspnea    Essential hypertension    Heart disease    Heart murmur    History of gout    Hypothyroidism    Obesity (BMI 30-39.9) 11/11/2017   OSA (obstructive sleep apnea) 02/18/2017    severe obstructive sleep apnea with an AHI of 75.6/h and mild central sleep apnea with a CAI of 7.7/h.  Oxygen saturations dropped as low as 82%.   He is on CPAP at 9 cm H2O.   OSA on CPAP    Personal history of chemotherapy    Personal history of radiation therapy    Presence of permanent cardiac pacemaker    Primary localized osteoarthritis of right knee 05/13/2018   Refusal of blood transfusions as patient is Jehovah's Witness    Type II diabetes mellitus (HCC)     Current Outpatient Medications:     acetaminophen  (TYLENOL ) 500 MG tablet, Take 500-1,000 mg by mouth every 6 (six) hours as needed (for pain or headaches)., Disp: , Rfl:    acetic acid 2 % otic solution, Place 4 drops into the left ear daily., Disp: , Rfl:    allopurinol  (ZYLOPRIM ) 100 MG tablet, Take 100 mg by mouth daily., Disp: , Rfl:    apixaban  (ELIQUIS ) 5 MG TABS tablet, Take 1 tablet (5 mg total) by mouth 2 (two) times daily., Disp: 180 tablet, Rfl: 3   atorvastatin  (LIPITOR ) 80 MG tablet, Take 1 tablet (80 mg total) by mouth at bedtime., Disp: 100 tablet, Rfl: 3   B Complex-Biotin -FA (B-COMPLEX PO), Take 1 tablet by mouth daily., Disp: , Rfl:    Biotin  10 MG TABS, Take 10 mg by mouth daily., Disp: , Rfl:    Blood Glucose Monitoring Suppl (ONETOUCH VERIO) w/Device KIT, Use to check blood sugar, Disp: 1 kit, Rfl: 0   carvedilol  (COREG ) 3.125 MG tablet, Take 1 tablet (3.125 mg total) by mouth 2 (two) times daily with a meal., Disp: 180 tablet, Rfl: 3   cetirizine (ZYRTEC) 10 MG tablet, Take 10 mg by mouth daily., Disp: , Rfl:    Cinnamon 500 MG capsule, Take 500 mg by  mouth daily., Disp: , Rfl:    clotrimazole-betamethasone (LOTRISONE) lotion, Apply 1 application topically 2 (two) times daily as needed (itching)., Disp: , Rfl:    Continuous Glucose Sensor (DEXCOM G6 SENSOR) MISC, 1 Device by Does not apply route as directed., Disp: 9 each, Rfl: 3   Continuous Glucose Transmitter (DEXCOM G6 TRANSMITTER) MISC, Change every 90 days, Disp: 1 each, Rfl: 3   empagliflozin  (JARDIANCE ) 10 MG TABS tablet, Take 1 tablet (10 mg total) by mouth daily., Disp: 90 tablet, Rfl: 3   ferrous sulfate  325 (65 FE) MG tablet, Take 1 tablet (325 mg total) by mouth 3 (three) times a week., Disp: 45 tablet, Rfl: 3   folic acid  (FOLVITE ) 1 MG tablet, Take 1 tablet by mouth once daily, Disp: 90 tablet, Rfl: 3   gabapentin  (NEURONTIN ) 300 MG capsule, Take 300 mg by mouth 2 (two) times daily., Disp: , Rfl:    glucose blood (ONETOUCH VERIO) test strip,  Check blood sugar 2 times daily, Disp: 100 each, Rfl: 12   insulin  aspart (NOVOLOG  FLEXPEN) 100 UNIT/ML FlexPen, DIAL  AND INJECT UNDER THE SKIN THREE TIMES DAILY PER CORRECTION SCALE. MAX DAILY DOSE 50 UNITS, Disp: 15 mL, Rfl: 0   Insulin  Glargine (BASAGLAR  KWIKPEN) 100 UNIT/ML, Inject 40 Units into the skin at bedtime., Disp: 45 mL, Rfl: 2   insulin  lispro (HUMALOG  KWIKPEN) 100 UNIT/ML KwikPen, Take HUmalog  8 units with each meal PLUS Humalog  correctional insulin  scale max daily dose 50 units (Patient not taking: Reported on 12/25/2023), Disp: 30 mL, Rfl: 3   Insulin  Lispro (HUMALOG  ), Inject 8 Units into the skin in the morning, at noon, and at bedtime., Disp: , Rfl:    Insulin  Pen Needle 32G X 4 MM MISC, 1 Device by Does not apply route in the morning, at noon, in the evening, and at bedtime., Disp: 400 each, Rfl: 3   Lancets (ONETOUCH DELICA PLUS LANCET33G) MISC, USE   TO CHECK GLUCOSE 4 TIMES DAILY, Disp: 100 each, Rfl: 3   latanoprost  (XALATAN ) 0.005 % ophthalmic solution, Place 1 drop into both eyes at bedtime., Disp: , Rfl:    levothyroxine  (SYNTHROID , LEVOTHROID) 75 MCG tablet, Take 75 mcg by mouth daily before breakfast., Disp: , Rfl:    LUMIGAN 0.01 % SOLN, Place 1 drop into both eyes at bedtime. (Patient not taking: Reported on 12/25/2023), Disp: , Rfl:    Multiple Vitamins-Minerals (ALIVE ONCE DAILY WOMENS 50+ PO), Take 1 tablet by mouth daily., Disp: , Rfl:    Omega-3 Fatty Acids (FISH OIL) 1200 MG CAPS, Take 1,200 mg by mouth at bedtime. Taking 2 capsules at night, Disp: , Rfl:    ondansetron  (ZOFRAN ) 4 MG tablet, Take 1 tablet (4 mg total) by mouth every 8 (eight) hours as needed for nausea or vomiting. (Patient not taking: Reported on 12/25/2023), Disp: 20 tablet, Rfl: 0   spironolactone  (ALDACTONE ) 25 MG tablet, Take 1 tablet (25 mg total) by mouth daily., Disp: 90 tablet, Rfl: 3   torsemide  (DEMADEX ) 20 MG tablet, Take 2 tablets (40 mg total) by mouth daily. May take extra 40 mg as  needed for weight gain, increased swelling, or increased shortness of breath, Disp: 190 tablet, Rfl: 3   Travoprost, BAK Free, (TRAVATAN) 0.004 % SOLN ophthalmic solution, Place 1 drop into both eyes at bedtime. (Patient not taking: Reported on 12/25/2023), Disp: , Rfl:   Social History   Tobacco Use  Smoking Status Former   Current packs/day: 0.00   Average packs/day: 1  pack/day for 22.0 years (22.0 ttl pk-yrs)   Types: Cigarettes   Start date: 30   Quit date: 52   Years since quitting: 42.6  Smokeless Tobacco Never    Allergies  Allergen Reactions   Codeine Other (See Comments)   Other Other (See Comments)    Blood Product Refusal (Jehovah's witness)     Tape Other (See Comments)    Leaves blisters and marks on the skin   Sulfa Antibiotics Rash   Uloric  [Febuxostat ] Rash   Objective:  Vascular: DP/PT pulses 2/4 bilateral. CFT <3 seconds. Absent hair growth on digits. Edema noted to bilateral lower extremities. Xerosis noted bilaterally.  Skin. No lacerations or abrasions bilateral feet. Nails 1-5 bilateral  are thickened discolored and elongated with subungual debris. Left second digit amputation . Left hallux with hyperkeratosis noted and upon debridedment ulceration noted to plantar hallux with granular base. Nearly healed. No erythema edema or purulence noted. No probe to bone Musculoskeletal: MMT 5/5 bilateral lower extremities in DF, PF, Inversion and Eversion. Deceased ROM in DF of ankle joint. Left second digit amputation.  No tenderness erythema or edema noted to the great toe currently.  Neurological: Sensation intact to light touch. Protective sensation diminished bilateral.   Assessment:   1. Skin ulcer of left great toe with fat layer exposed (HCC)   2. Type II diabetes mellitus with peripheral circulatory disorder [facility])            Plan:  Patient was evaluated and treated and all questions answered. Ulcer left hallux with fat layer exposed   X-ray  reviewed. No obvious erosion noted. Small area plantar hallux will keep an eye on.   -Debridement as below. -Dressed with hydrofera blue, DSD. -Off-loading with surgical shoe and offloading padding.  -Has DM shoes but advised to hold off until we get this healed.  -No abx indicated.  -Discussed glucose control and proper protein-rich diet.  -Discussed if any worsening redness, pain, fever or chills to call or may need to report to the emergency room. Patient expressed understanding.   -Discussed and educated patient on diabetic foot care, especially with  regards to the vascular, neurological and musculoskeletal systems.  -Stressed the importance of good glycemic control and the detriment of not  controlling glucose levels in relation to the foot. -Discussed supportive shoes at all times and checking feet regularly.  -Mechanically debrided all nails 1-5 bilateral using sterile nail nipper and filed with dremel without incident  -Answered all patient questions  Procedure: Excisional Debridement of Wound Rationale: Removal of non-viable soft tissue from the wound to promote healing.  Anesthesia: none Pre-Debridement Wound Measurements: Overlying callus  Post-Debridement Wound Measurements: 0.4 cm x 0.2 cm x 0.2 cm  Type of Debridement: Sharp Excisional Tissue Removed: Non-viable soft tissue Depth of Debridement: subcutaneous tissue. Technique: Sharp excisional debridement to bleeding, viable wound base.  Dressing: Dry, sterile, compression dressing. Disposition: Patient tolerated procedure well. Patient to return in 2 week for follow-up.   No follow-ups on file.   No follow-ups on file.

## 2023-12-31 DIAGNOSIS — R0789 Other chest pain: Secondary | ICD-10-CM | POA: Diagnosis not present

## 2024-01-02 ENCOUNTER — Other Ambulatory Visit (HOSPITAL_COMMUNITY): Payer: Self-pay

## 2024-01-02 DIAGNOSIS — R0789 Other chest pain: Secondary | ICD-10-CM | POA: Diagnosis not present

## 2024-01-03 DIAGNOSIS — R262 Difficulty in walking, not elsewhere classified: Secondary | ICD-10-CM | POA: Diagnosis not present

## 2024-01-03 NOTE — Progress Notes (Signed)
 Paramedicine Encounter    Patient ID: Danielle [name], female    DOB: 1945-11-08, 78 y.o.   MRN: [id number]  MED REC ONLY   Compliance with meds- missed one dose   Pill box filled- for one week   Refills needed- Jardiance , folic acid    Meds changes since last visit- none     Social changes- sleep study scheduled for 9/21 at 8PM   Dexcom replaced    VISIT SUMMARY- Arrived for home visit for [facility] for a med rec- she reports feeling well and seeing her PCP earlier today. No complaints. She denied chest pain, shortness of breath, dizziness, swelling. Weight is baseline. I reviewed meds and confirmed same filling pill box for one week. Refills will be called into [facility]. Home visit complete.       ACTION: Home visit completed     Patient Care Team: Rexanne Ingle, MD as PCP - General (Internal Medicine) Inocencio Soyla Lunger, MD as PCP - Electrophysiology (Cardiology) Shlomo Wilbert SAUNDERS, MD as PCP - Sleep Medicine (Cardiology) Bensimhon, Toribio SAUNDERS, MD as PCP - Cardiology (Cardiology) Cathern Andriette DEL, LCSW as Social Worker (Licensed Clinical Social Worker) Shamleffer, Donell Cardinal, MD as Consulting Physician (Endocrinology)  Patient Active Problem List   Diagnosis Date Noted   Chronic systolic heart failure (HCC) 05/08/2022   Type 2 diabetes mellitus with stage 3a chronic kidney disease, with long-term current use of insulin  (HCC) 02/16/2022   Prosthetic joint implant failure, initial encounter (HCC) 12/20/2021   Pseudophakia of both eyes 11/15/2021   DVT (deep venous thrombosis) (HCC) 07/31/2021   Acute blood loss anemia    Septic Arthritis, Infection of prosthetic right knee joint (HCC), MSSA infection 07/10/2021   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) 07/10/2021   CAD (coronary artery disease) 06/08/2021   Carotid artery disease (HCC) 06/08/2021   Symptomatic bradycardia 06/08/2021   Paroxysmal atrial fibrillation (HCC) 06/08/2021   CKD (chronic kidney  disease), stage III (HCC) 06/08/2021   Polyneuropathy associated with underlying disease (HCC) 05/04/2020   Severe nonproliferative diabetic retinopathy of left eye, with macular edema, associated with type 2 diabetes mellitus (HCC) 11/05/2019   Severe nonproliferative diabetic retinopathy of right eye, with macular edema, associated with type 2 diabetes mellitus (HCC) 11/05/2019   Retinal hemorrhage of right eye 11/05/2019   Retinal hemorrhage of left eye 11/05/2019   Diabetes mellitus (HCC) 05/14/2019   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, with long-term current use of insulin  (HCC) 05/14/2019   Type 2 diabetes mellitus with diabetic polyneuropathy, with long-term current use of insulin  (HCC) 05/14/2019   History of gout 05/20/2018   Primary localized osteoarthritis of right knee 05/13/2018   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) 12/20/2017   Obesity (BMI 30-39.9) 11/11/2017   OSA (obstructive sleep apnea) 02/18/2017   Essential hypertension    L Breast Cancer    Hypothyroidism     Current Outpatient Medications:    acetaminophen  (TYLENOL ) 500 MG tablet, Take 500-1,000 mg by mouth every 6 (six) hours as needed (for pain or headaches)., Disp: , Rfl:    acetic acid 2 % otic solution, Place 4 drops into the left ear daily., Disp: , Rfl:    allopurinol  (ZYLOPRIM ) 100 MG tablet, Take 100 mg by mouth daily., Disp: , Rfl:    apixaban  (ELIQUIS ) 5 MG TABS tablet, Take 1 tablet (5 mg total) by mouth 2 (two) times daily., Disp: 180 tablet, Rfl: 3   atorvastatin  (LIPITOR ) 80 MG tablet, Take 1 tablet (  80 mg total) by mouth at bedtime., Disp: 100 tablet, Rfl: 3   B Complex-Biotin -FA (B-COMPLEX PO), Take 1 tablet by mouth daily., Disp: , Rfl:    Biotin  10 MG TABS, Take 10 mg by mouth daily., Disp: , Rfl:    Blood Glucose Monitoring Suppl (ONETOUCH VERIO) w/Device KIT, Use to check blood sugar, Disp: 1 kit, Rfl: 0   carvedilol  (COREG ) 3.125 MG tablet, Take 1 tablet (3.125 mg  total) by mouth 2 (two) times daily with a meal., Disp: 180 tablet, Rfl: 3   cetirizine (ZYRTEC) 10 MG tablet, Take 10 mg by mouth daily., Disp: , Rfl:    Cinnamon 500 MG capsule, Take 500 mg by mouth daily., Disp: , Rfl:    clotrimazole-betamethasone (LOTRISONE) lotion, Apply 1 application topically 2 (two) times daily as needed (itching)., Disp: , Rfl:    Continuous Glucose Sensor (DEXCOM G6 SENSOR) MISC, 1 Device by Does not apply route as directed., Disp: 9 each, Rfl: 3   Continuous Glucose Transmitter (DEXCOM G6 TRANSMITTER) MISC, Change every 90 days, Disp: 1 each, Rfl: 3   empagliflozin  (JARDIANCE ) 10 MG TABS tablet, Take 1 tablet (10 mg total) by mouth daily., Disp: 90 tablet, Rfl: 3   ferrous sulfate  325 (65 FE) MG tablet, Take 1 tablet (325 mg total) by mouth 3 (three) times a week., Disp: 45 tablet, Rfl: 3   folic acid  (FOLVITE ) 1 MG tablet, Take 1 tablet by mouth once daily, Disp: 90 tablet, Rfl: 3   gabapentin  (NEURONTIN ) 300 MG capsule, Take 300 mg by mouth 2 (two) times daily., Disp: , Rfl:    glucose blood (ONETOUCH VERIO) test strip, Check blood sugar 2 times daily, Disp: 100 each, Rfl: 12   insulin  aspart (NOVOLOG  FLEXPEN) 100 UNIT/ML FlexPen, DIAL  AND INJECT UNDER THE SKIN THREE TIMES DAILY PER CORRECTION SCALE. MAX DAILY DOSE 50 UNITS, Disp: 15 mL, Rfl: 0   Insulin  Glargine (BASAGLAR  KWIKPEN) 100 UNIT/ML, Inject 40 Units into the skin at bedtime., Disp: 45 mL, Rfl: 2   insulin  lispro (HUMALOG  KWIKPEN) 100 UNIT/ML KwikPen, Take HUmalog  8 units with each meal PLUS Humalog  correctional insulin  scale max daily dose 50 units (Patient not taking: Reported on 12/30/2023), Disp: 30 mL, Rfl: 3   Insulin  Lispro (HUMALOG  [place]), Inject 8 Units into the skin in the morning, at noon, and at bedtime., Disp: , Rfl:    Insulin  Pen Needle 32G X 4 MM MISC, 1 Device by Does not apply route in the morning, at noon, in the evening, and at bedtime., Disp: 400 each, Rfl: 3   Lancets (ONETOUCH DELICA PLUS  LANCET33G) MISC, USE   TO CHECK GLUCOSE 4 TIMES DAILY, Disp: 100 each, Rfl: 3   latanoprost  (XALATAN ) 0.005 % ophthalmic solution, Place 1 drop into both eyes at bedtime., Disp: , Rfl:    levothyroxine  (SYNTHROID , LEVOTHROID) 75 MCG tablet, Take 75 mcg by mouth daily before breakfast., Disp: , Rfl:    LUMIGAN 0.01 % SOLN, Place 1 drop into both eyes at bedtime. (Patient not taking: Reported on 12/30/2023), Disp: , Rfl:    Multiple Vitamins-Minerals (ALIVE ONCE DAILY WOMENS 50+ PO), Take 1 tablet by mouth daily., Disp: , Rfl:    Omega-3 Fatty Acids (FISH OIL) 1200 MG CAPS, Take 1,200 mg by mouth at bedtime. Taking 2 capsules at night, Disp: , Rfl:    ondansetron  (ZOFRAN ) 4 MG tablet, Take 1 tablet (4 mg total) by mouth every 8 (eight) hours as needed for nausea or vomiting. (Patient not  taking: Reported on 12/30/2023), Disp: 20 tablet, Rfl: 0   spironolactone  (ALDACTONE ) 25 MG tablet, Take 1 tablet (25 mg total) by mouth daily., Disp: 90 tablet, Rfl: 3   torsemide  (DEMADEX ) 20 MG tablet, Take 2 tablets (40 mg total) by mouth daily. May take extra 40 mg as needed for weight gain, increased swelling, or increased shortness of breath, Disp: 190 tablet, Rfl: 3   Travoprost, BAK Free, (TRAVATAN) 0.004 % SOLN ophthalmic solution, Place 1 drop into both eyes at bedtime. (Patient not taking: Reported on 12/30/2023), Disp: , Rfl:  Allergies  Allergen Reactions   Codeine Other (See Comments)   Other Other (See Comments)    Blood Product Refusal (Jehovah's witness)     Tape Other (See Comments)    Leaves blisters and marks on the skin   Sulfa Antibiotics Rash   Uloric  [Febuxostat ] Rash     Social History   Socioeconomic History   Marital status: Widowed    Spouse name: Not on file   Number of children: 3   Years of education: Not on file   Highest education level: Some college, no degree  Occupational History   Occupation: retired  Tobacco Use   Smoking status: Former    Current packs/day: 0.00     Average packs/day: 1 pack/day for 22.0 years (22.0 ttl pk-yrs)    Types: Cigarettes    Start date: 60    Quit date: 72    Years since quitting: 42.6   Smokeless tobacco: Never  Vaping Use   Vaping status: Never Used  Substance and Sexual Activity   Alcohol use: Not Currently    Comment: 05/22/2018 glass of wine couple times/wk   Drug use: Not Currently   Sexual activity: Not Currently    Birth control/protection: None, Post-menopausal  Other Topics Concern   Not on file  Social History Narrative   Not on file   Social Drivers of Health   Financial Resource Strain: Medium Risk (12/22/2021)   Overall Financial Resource Strain (CARDIA)    Difficulty of Paying Living Expenses: Somewhat hard  Food Insecurity: No Food Insecurity (05/08/2018)   Hunger Vital Sign    Worried About Running Out of Food in the Last Year: Never true    Ran Out of Food in the Last Year: Never true  Transportation Needs: Unmet Transportation Needs (05/08/2022)   PRAPARE - Administrator, Civil Service (Medical): Yes    Lack of Transportation (Non-Medical): Yes  Physical Activity: Not on file  Stress: Not on file  Social Connections: Not on file  Intimate Partner Violence: Not on file    Physical Exam      Future Appointments  Date Time Provider Department Center  01/14/2024  7:05 AM [facility] H[facility] DEVICE REMOTES [facility]  01/20/2024 10:15 AM Joya Stabs, DPM [facility]  02/16/2024  8:00 PM Shlomo Wilbert SAUNDERS, MD [facility]  04/14/2024  7:05 AM [facility] DEVICE REMOTES [facility]  07/14/2024  7:05 AM [facility] DEVICE REMOTES [facility]  10/13/2024  7:05 AM [facility] DEVICE REMOTES [facility]  01/12/2025  7:05 AM [facility] DEVICE REMOTES [facility]  04/13/2025  7:05 AM [facility] DEVICE REMOTES [facility]  07/13/2025  7:05 AM [facility] H[facility] DEVICE REMOTES [facility]

## 2024-01-08 DIAGNOSIS — R262 Difficulty in walking, not elsewhere classified: Secondary | ICD-10-CM | POA: Diagnosis not present

## 2024-01-09 ENCOUNTER — Other Ambulatory Visit (HOSPITAL_COMMUNITY): Payer: Self-pay

## 2024-01-09 NOTE — Progress Notes (Signed)
 Paramedicine Encounter    Patient ID: Danielle [name], female    DOB: 1946-03-30, 78 y.o.   MRN: [id number]   Complaints- none   Assessment- Caox4, warm and dry ambulatory in her apartment with no complaints today. Vitals obtained and within normal limits. Minimal lower leg edema. Lungs clear. Dexcom in place - CBG = 237   Compliance with meds- missed a few night time doses   Pill box filled- for two weeks  Refills needed- Jardiance  (needed week 2)  Meds changes since last visit- none     Social changes- none    VISIT SUMMARY- Arrived for home visit for Danielle [name] who reports to be feeling well with no complaints. I obtained vitals and assessment as noted. I reviewed meds and confirmed same filling pill box for two weeks. Needs Jardiance  in week two. I called [facility] and they will be delivering same tomorrow. CBG is 237 on dexcom- needs to be replaced in 3 days, I will come out on Monday. I reviewed HF education and upcoming appointments. Home visit complete.   BP 110/72   Pulse 78   Resp 16   Wt 204 lb (92.5 kg)   SpO2 97%   BMI 35.02 kg/m  Weight yesterday-- 203lbs  Last visit weight-- 202lbs      ACTION: Home visit completed     Patient Care Team: Rexanne Ingle, MD as PCP - General (Internal Medicine) Inocencio Soyla Lunger, MD as PCP - Electrophysiology (Cardiology) Shlomo Wilbert SAUNDERS, MD as PCP - Sleep Medicine (Cardiology) Bensimhon, Toribio SAUNDERS, MD as PCP - Cardiology (Cardiology) Cathern Andriette DEL, LCSW as Social Worker (Licensed Clinical Social Worker) Shamleffer, Donell Cardinal, MD as Consulting Physician (Endocrinology)  Patient Active Problem List   Diagnosis Date Noted   Chronic systolic heart failure (HCC) 05/08/2022   Type 2 diabetes mellitus with stage 3a chronic kidney disease, with long-term current use of insulin  (HCC) 02/16/2022   Prosthetic joint implant failure, initial encounter (HCC) 12/20/2021   Pseudophakia of both eyes 11/15/2021   DVT  (deep venous thrombosis) (HCC) 07/31/2021   Acute blood loss anemia    Septic Arthritis, Infection of prosthetic right knee joint (HCC), MSSA infection 07/10/2021   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) 07/10/2021   CAD (coronary artery disease) 06/08/2021   Carotid artery disease (HCC) 06/08/2021   Symptomatic bradycardia 06/08/2021   Paroxysmal atrial fibrillation (HCC) 06/08/2021   CKD (chronic kidney disease), stage III (HCC) 06/08/2021   Polyneuropathy associated with underlying disease (HCC) 05/04/2020   Severe nonproliferative diabetic retinopathy of left eye, with macular edema, associated with type 2 diabetes mellitus (HCC) 11/05/2019   Severe nonproliferative diabetic retinopathy of right eye, with macular edema, associated with type 2 diabetes mellitus (HCC) 11/05/2019   Retinal hemorrhage of right eye 11/05/2019   Retinal hemorrhage of left eye 11/05/2019   Diabetes mellitus (HCC) 05/14/2019   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, with long-term current use of insulin  (HCC) 05/14/2019   Type 2 diabetes mellitus with diabetic polyneuropathy, with long-term current use of insulin  (HCC) 05/14/2019   History of gout 05/20/2018   Primary localized osteoarthritis of right knee 05/13/2018   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, Disp: 180 tablet, Rfl: 3    atorvastatin  (LIPITOR ) 80 MG tablet, Take 1 tablet (80 mg total) by mouth at bedtime., Disp: 100 tablet, Rfl: 3   B Complex-Biotin -FA (B-COMPLEX PO), Take 1 tablet by mouth daily., Disp: , Rfl:    Biotin  10 MG TABS, Take 10 mg by mouth daily., Disp: , Rfl:    Blood Glucose Monitoring Suppl (ONETOUCH VERIO) w/Device KIT, Use to check blood sugar, Disp: 1 kit, Rfl: 0   carvedilol  (COREG ) 3.125 MG tablet, Take 1 tablet (3.125 mg total) by mouth 2 (two) times daily with a meal., Disp: 180 tablet, Rfl: 3   cetirizine (ZYRTEC) 10 MG tablet, Take 10 mg by mouth daily., Disp: , Rfl:    Cinnamon 500 MG capsule, Take 500 mg by mouth daily., Disp: , Rfl:    clotrimazole-betamethasone (LOTRISONE) lotion, Apply 1 application topically 2 (two) times daily as needed (itching)., Disp: , Rfl:    Continuous Glucose Sensor (DEXCOM G6 SENSOR) MISC, 1 Device by Does not apply route as directed., Disp: 9 each, Rfl: 3   Continuous Glucose Transmitter (DEXCOM G6 TRANSMITTER) MISC, Change every 90 days, Disp: 1 each, Rfl: 3   empagliflozin  (JARDIANCE ) 10 MG TABS tablet, Take 1 tablet (10 mg total) by mouth daily., Disp: 90 tablet, Rfl: 3   ferrous sulfate  325 (65 FE) MG tablet, Take 1 tablet (325 mg total) by mouth 3 (three) times a week., Disp: 45 tablet, Rfl: 3   folic acid  (FOLVITE ) 1 MG tablet, Take 1 tablet by mouth once daily, Disp: 90 tablet, Rfl: 3   gabapentin  (NEURONTIN ) 300 MG capsule, Take 300 mg by mouth 2 (two) times daily., Disp: , Rfl:    glucose blood (ONETOUCH VERIO) test strip, Check blood sugar 2 times daily, Disp: 100 each, Rfl: 12   insulin  aspart (NOVOLOG  FLEXPEN) 100 UNIT/ML FlexPen, DIAL  AND INJECT UNDER THE SKIN THREE TIMES DAILY PER CORRECTION SCALE. MAX DAILY DOSE 50 UNITS, Disp: 15 mL, Rfl: 0   Insulin  Glargine (BASAGLAR  KWIKPEN) 100 UNIT/ML, Inject 40 Units into the skin at bedtime., Disp: 45 mL, Rfl: 2   Insulin  Lispro (HUMALOG  [place]), Inject 8 Units into the skin in the morning, at noon, and at  bedtime., Disp: , Rfl:    Insulin  Pen Needle 32G X 4 MM MISC, 1 Device by Does not apply route in the morning, at noon, in the evening, and at bedtime., Disp: 400 each, Rfl: 3   Lancets (ONETOUCH DELICA PLUS LANCET33G) MISC, USE   TO CHECK GLUCOSE 4 TIMES DAILY, Disp: 100 each, Rfl: 3   latanoprost  (XALATAN ) 0.005 % ophthalmic solution, Place 1 drop into both eyes at bedtime., Disp: , Rfl:    levothyroxine  (SYNTHROID , LEVOTHROID) 75 MCG tablet, Take 75 mcg by mouth daily before breakfast., Disp: , Rfl:    Multiple Vitamins-Minerals (ALIVE ONCE DAILY WOMENS 50+ PO), Take 1 tablet by mouth daily., Disp: , Rfl:    Omega-3 Fatty Acids (FISH OIL) 1200 MG CAPS, Take 1,200 mg by mouth at bedtime. Taking 2 capsules at night, Disp: , Rfl:  spironolactone  (ALDACTONE ) 25 MG tablet, Take 1 tablet (25 mg total) by mouth daily., Disp: 90 tablet, Rfl: 3   torsemide  (DEMADEX ) 20 MG tablet, Take 2 tablets (40 mg total) by mouth daily. May take extra 40 mg as needed for weight gain, increased swelling, or increased shortness of breath, Disp: 190 tablet, Rfl: 3   insulin  lispro (HUMALOG  KWIKPEN) 100 UNIT/ML KwikPen, Take HUmalog  8 units with each meal PLUS Humalog  correctional insulin  scale max daily dose 50 units (Patient not taking: Reported on 12/30/2023), Disp: 30 mL, Rfl: 3   LUMIGAN 0.01 % SOLN, Place 1 drop into both eyes at bedtime. (Patient not taking: Reported on 12/30/2023), Disp: , Rfl:    ondansetron  (ZOFRAN ) 4 MG tablet, Take 1 tablet (4 mg total) by mouth every 8 (eight) hours as needed for nausea or vomiting. (Patient not taking: Reported on 12/30/2023), Disp: 20 tablet, Rfl: 0   Travoprost, BAK Free, (TRAVATAN) 0.004 % SOLN ophthalmic solution, Place 1 drop into both eyes at bedtime. (Patient not taking: Reported on 12/30/2023), Disp: , Rfl:  Allergies  Allergen Reactions   Codeine Other (See Comments)   Other Other (See Comments)    Blood Product Refusal (Jehovah's witness)     Tape Other (See Comments)     Leaves blisters and marks on the skin   Sulfa Antibiotics Rash   Uloric  [Febuxostat ] Rash     Social History   Socioeconomic History   Marital status: Widowed    Spouse name: Not on file   Number of children: 3   Years of education: Not on file   Highest education level: Some college, no degree  Occupational History   Occupation: retired  Tobacco Use   Smoking status: Former    Current packs/day: 0.00    Average packs/day: 1 pack/day for 22.0 years (22.0 ttl pk-yrs)    Types: Cigarettes    Start date: 59    Quit date: 1983    Years since quitting: 42.6   Smokeless tobacco: Never  Vaping Use   Vaping status: Never Used  Substance and Sexual Activity   Alcohol use: Not Currently    Comment: 05/22/2018 glass of wine couple times/wk   Drug use: Not Currently   Sexual activity: Not Currently    Birth control/protection: None, Post-menopausal  Other Topics Concern   Not on file  Social History Narrative   Not on file   Social Drivers of Health   Financial Resource Strain: Medium Risk (12/22/2021)   Overall Financial Resource Strain (CARDIA)    Difficulty of Paying Living Expenses: Somewhat hard  Food Insecurity: No Food Insecurity (05/08/2018)   Hunger Vital Sign    Worried About Running Out of Food in the Last Year: Never true    Ran Out of Food in the Last Year: Never true  Transportation Needs: Unmet Transportation Needs (05/08/2022)   PRAPARE - Administrator, Civil Service (Medical): Yes    Lack of Transportation (Non-Medical): Yes  Physical Activity: Not on file  Stress: Not on file  Social Connections: Not on file  Intimate Partner Violence: Not on file    Physical Exam      Future Appointments  Date Time Provider Department Center  01/14/2024  7:05 AM [facility] HVT DEVICE REMOTES [facility]  01/20/2024 10:15 AM Joya Stabs, DPM [facility]  02/16/2024  8:00 PM Shlomo Wilbert SAUNDERS, MD [facility]  04/14/2024  7:05 AM [facility] HVT  DEVICE REMOTES [facility]  07/14/2024  7:05  AM [facility] DEVICE REMOTES [facility]  10/13/2024  7:05 AM [facility] DEVICE REMOTES [facility]  01/12/2025  7:05 AM [facility] DEVICE REMOTES [facility]  04/13/2025  7:05 AM [facility] DEVICE REMOTES [facility]  07/13/2025  7:05 AM [facility] H[facility] DEVICE REMOTES [facility]

## 2024-01-14 ENCOUNTER — Ambulatory Visit (INDEPENDENT_AMBULATORY_CARE_PROVIDER_SITE_OTHER)

## 2024-01-14 DIAGNOSIS — I495 Sick sinus syndrome: Secondary | ICD-10-CM | POA: Diagnosis not present

## 2024-01-16 ENCOUNTER — Telehealth: Payer: Self-pay | Admitting: Cardiology

## 2024-01-16 NOTE — Telephone Encounter (Signed)
 Patient wants a call back to confirm her transmission was received.

## 2024-01-16 NOTE — Telephone Encounter (Signed)
 Patient called back and informed that transmission was received  Noticed that patient last office visit was June 2021  Messaged EP scheduling to get patient EP appt  Patient scheduled for 9/9  All questions and concerns addressed at this time   Patient appreciative of phone call and assistance with appt set up

## 2024-01-17 ENCOUNTER — Ambulatory Visit: Payer: Self-pay | Admitting: Cardiology

## 2024-01-17 LAB — CUP PACEART REMOTE DEVICE CHECK
Battery Remaining Longevity: 28 mo
Battery Voltage: 2.92 V
Brady Statistic AP VP Percent: 0 %
Brady Statistic AP VS Percent: 1.27 %
Brady Statistic AS VP Percent: 0.06 %
Brady Statistic AS VS Percent: 98.66 %
Brady Statistic RA Percent Paced: 1.27 %
Brady Statistic RV Percent Paced: 0.06 %
Date Time Interrogation Session: 20250821135902
Implantable Lead Connection Status: 753985
Implantable Lead Connection Status: 753985
Implantable Lead Implant Date: 20160222
Implantable Lead Implant Date: 20160222
Implantable Lead Location: 753859
Implantable Lead Location: 753860
Implantable Lead Model: 5076
Implantable Lead Model: 5076
Implantable Pulse Generator Implant Date: 20160222
Lead Channel Impedance Value: 399 Ohm
Lead Channel Impedance Value: 399 Ohm
Lead Channel Impedance Value: 437 Ohm
Lead Channel Impedance Value: 494 Ohm
Lead Channel Pacing Threshold Amplitude: 0.75 V
Lead Channel Pacing Threshold Amplitude: 0.75 V
Lead Channel Pacing Threshold Pulse Width: 0.4 ms
Lead Channel Pacing Threshold Pulse Width: 0.4 ms
Lead Channel Sensing Intrinsic Amplitude: 18.25 mV
Lead Channel Sensing Intrinsic Amplitude: 18.25 mV
Lead Channel Sensing Intrinsic Amplitude: 3.75 mV
Lead Channel Sensing Intrinsic Amplitude: 3.75 mV
Lead Channel Setting Pacing Amplitude: 2 V
Lead Channel Setting Pacing Amplitude: 2 V
Lead Channel Setting Pacing Pulse Width: 0.4 ms
Lead Channel Setting Sensing Sensitivity: 2 mV
Zone Setting Status: 755011
Zone Setting Status: 755011

## 2024-01-20 ENCOUNTER — Ambulatory Visit: Admitting: Podiatry

## 2024-01-20 ENCOUNTER — Encounter: Payer: Self-pay | Admitting: Podiatry

## 2024-01-20 VITALS — BP 128/57 | HR 73 | Temp 98.1°F

## 2024-01-20 DIAGNOSIS — L97522 Non-pressure chronic ulcer of other part of left foot with fat layer exposed: Secondary | ICD-10-CM

## 2024-01-20 DIAGNOSIS — E1151 Type 2 diabetes mellitus with diabetic peripheral angiopathy without gangrene: Secondary | ICD-10-CM

## 2024-01-20 DIAGNOSIS — M2032 Hallux varus (acquired), left foot: Secondary | ICD-10-CM

## 2024-01-20 NOTE — Progress Notes (Signed)
 Subjective:  Patient ID: Danielle [name], female    DOB: [birth year],  MRN: [id number]  Chief Complaint  Patient presents with   Diabetic Ulcer    I want to know what's going on with this toe.  It has a leak, I mess up a lot of socks.  The pain is ridiculous.    78 y.o. female returns for follow-up of left great toe wound.   Relates doing ok. SABRA Has been dressing the great toe as instructed. Patient is diabetic and last A1c was  Lab Results  Component Value Date   HGBA1C 10.2 (A) 11/09/2022   .   PCP:  Rexanne Ingle, MD      Review of Systems: Negative except as noted in the HPI. Denies N/V/F/Ch.  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HCC) 02/05/2017   Acute gout due to renal impairment involving right wrist 05/20/2018   Acute kidney failure (HCC) 05/15/2018   Arthritis    all over (05/22/2018)   Blood dyscrasia    per pt-has small blood cells-appears as if anemic   Breast cancer, left breast (HCC) 1985   CHF (congestive heart failure) (HCC)    Complication of anesthesia    difficult to awaken from per pt   Coronary artery disease    Dyspnea    Essential hypertension    Heart disease    Heart murmur    History of gout    Hypothyroidism    Obesity (BMI 30-39.9) 11/11/2017   OSA (obstructive sleep apnea) 02/18/2017    severe obstructive sleep apnea with an AHI of 75.6/h and mild central sleep apnea with a CAI of 7.7/h.  Oxygen saturations dropped as low as 82%.   He is on CPAP at 9 cm H2O.   OSA on CPAP    Personal history of chemotherapy    Personal history of radiation therapy    Presence of permanent cardiac pacemaker    Primary localized osteoarthritis of right knee 05/13/2018   Refusal of blood transfusions as patient is Jehovah's Witness    Type II diabetes mellitus (HCC)     Current Outpatient Medications:    acetaminophen  (TYLENOL ) 500 MG tablet, Take 500-1,000 mg by mouth every 6 (six) hours  as needed (for pain or headaches)., Disp: , Rfl:    acetic acid 2 % otic solution, Place 4 drops into the left ear daily., Disp: , Rfl:    allopurinol  (ZYLOPRIM ) 100 MG tablet, Take 100 mg by mouth daily., Disp: , Rfl:    apixaban  (ELIQUIS ) 5 MG TABS tablet, Take 1 tablet (5 mg total) by mouth 2 (two) times daily., Disp: 180 tablet, Rfl: 3   atorvastatin  (LIPITOR ) 80 MG tablet, Take 1 tablet (80 mg total) by mouth at bedtime., Disp: 100 tablet, Rfl: 3   B Complex-Biotin -FA (B-COMPLEX PO), Take 1 tablet by mouth daily., Disp: , Rfl:    Biotin  10 MG TABS, Take 10 mg by mouth daily., Disp: , Rfl:    Blood Glucose Monitoring Suppl (ONETOUCH VERIO) w/Device KIT, Use to check blood sugar, Disp: 1 kit, Rfl: 0   carvedilol  (COREG ) 3.125 MG tablet, Take 1 tablet (3.125 mg total) by mouth 2 (two) times daily with a meal., Disp: 180 tablet, Rfl: 3   cetirizine (ZYRTEC) 10 MG tablet, Take 10 mg by mouth daily., Disp: , Rfl:    Cinnamon 500 MG capsule, Take 500 mg by mouth daily., Disp: , Rfl:  clotrimazole-betamethasone (LOTRISONE) lotion, Apply 1 application topically 2 (two) times daily as needed (itching)., Disp: , Rfl:    Continuous Glucose Sensor (DEXCOM G6 SENSOR) MISC, 1 Device by Does not apply route as directed., Disp: 9 each, Rfl: 3   Continuous Glucose Transmitter (DEXCOM G6 TRANSMITTER) MISC, Change every 90 days, Disp: 1 each, Rfl: 3   empagliflozin  (JARDIANCE ) 10 MG TABS tablet, Take 1 tablet (10 mg total) by mouth daily., Disp: 90 tablet, Rfl: 3   ferrous sulfate  325 (65 FE) MG tablet, Take 1 tablet (325 mg total) by mouth 3 (three) times a week., Disp: 45 tablet, Rfl: 3   folic acid  (FOLVITE ) 1 MG tablet, Take 1 tablet by mouth once daily, Disp: 90 tablet, Rfl: 3   gabapentin  (NEURONTIN ) 300 MG capsule, Take 300 mg by mouth 2 (two) times daily., Disp: , Rfl:    glucose blood (ONETOUCH VERIO) test strip, Check blood sugar 2 times daily, Disp: 100 each, Rfl: 12   insulin  aspart (NOVOLOG   FLEXPEN) 100 UNIT/ML FlexPen, DIAL  AND INJECT UNDER THE SKIN THREE TIMES DAILY PER CORRECTION SCALE. MAX DAILY DOSE 50 UNITS, Disp: 15 mL, Rfl: 0   Insulin  Glargine (BASAGLAR  KWIKPEN) 100 UNIT/ML, Inject 40 Units into the skin at bedtime., Disp: 45 mL, Rfl: 2   Insulin  Lispro (HUMALOG  [place]), Inject 8 Units into the skin in the morning, at noon, and at bedtime., Disp: , Rfl:    Insulin  Pen Needle 32G X 4 MM MISC, 1 Device by Does not apply route in the morning, at noon, in the evening, and at bedtime., Disp: 400 each, Rfl: 3   Lancets (ONETOUCH DELICA PLUS LANCET33G) MISC, USE   TO CHECK GLUCOSE 4 TIMES DAILY, Disp: 100 each, Rfl: 3   latanoprost  (XALATAN ) 0.005 % ophthalmic solution, Place 1 drop into both eyes at bedtime., Disp: , Rfl:    levothyroxine  (SYNTHROID , LEVOTHROID) 75 MCG tablet, Take 75 mcg by mouth daily before breakfast., Disp: , Rfl:    Multiple Vitamins-Minerals (ALIVE ONCE DAILY WOMENS 50+ PO), Take 1 tablet by mouth daily., Disp: , Rfl:    Omega-3 Fatty Acids (FISH OIL) 1200 MG CAPS, Take 1,200 mg by mouth at bedtime. Taking 2 capsules at night, Disp: , Rfl:    spironolactone  (ALDACTONE ) 25 MG tablet, Take 1 tablet (25 mg total) by mouth daily., Disp: 90 tablet, Rfl: 3   torsemide  (DEMADEX ) 20 MG tablet, Take 2 tablets (40 mg total) by mouth daily. May take extra 40 mg as needed for weight gain, increased swelling, or increased shortness of breath, Disp: 190 tablet, Rfl: 3   insulin  lispro (HUMALOG  KWIKPEN) 100 UNIT/ML KwikPen, Take HUmalog  8 units with each meal PLUS Humalog  correctional insulin  scale max daily dose 50 units (Patient not taking: Reported on 01/20/2024), Disp: 30 mL, Rfl: 3   LUMIGAN 0.01 % SOLN, Place 1 drop into both eyes at bedtime. (Patient not taking: Reported on 01/20/2024), Disp: , Rfl:    ondansetron  (ZOFRAN ) 4 MG tablet, Take 1 tablet (4 mg total) by mouth every 8 (eight) hours as needed for nausea or vomiting. (Patient not taking: Reported on 01/20/2024), Disp:  20 tablet, Rfl: 0   Travoprost, BAK Free, (TRAVATAN) 0.004 % SOLN ophthalmic solution, Place 1 drop into both eyes at bedtime. (Patient not taking: Reported on 01/20/2024), Disp: , Rfl:   Social History   Tobacco Use  Smoking Status Former   Current packs/day: 0.00   Average packs/day: 1 pack/day for 22.0 years (22.0 ttl pk-yrs)  Types: Cigarettes   Start date: 52   Quit date: 66   Years since quitting: 42.6  Smokeless Tobacco Never    Allergies  Allergen Reactions   Codeine Other (See Comments)   Other Other (See Comments)    Blood Product Refusal (Jehovah's witness)     Tape Other (See Comments)    Leaves blisters and marks on the skin   Sulfa Antibiotics Rash   Uloric  [Febuxostat ] Rash   Objective:  Vascular: DP/PT pulses 2/4 bilateral. CFT <3 seconds. Absent hair growth on digits. Edema noted to bilateral lower extremities. Xerosis noted bilaterally.  Skin. No lacerations or abrasions bilateral feet. Nails 1-5 bilateral  are thickened discolored and elongated with subungual debris. Left second digit amputation . Left hallux with hyperkeratosis noted and upon debridedment ulceration noted to plantar hallux with granular base. Nearly healed. No erythema edema or purulence noted. No probe to bone Musculoskeletal: MMT 5/5 bilateral lower extremities in DF, PF, Inversion and Eversion. Deceased ROM in DF of ankle joint. Left second digit amputation.  No tenderness erythema or edema noted to the great toe currently.  Neurological: Sensation intact to light touch. Protective sensation diminished bilateral.   Assessment:   1. Skin ulcer of left great toe with fat layer exposed (HCC)   2. Type II diabetes mellitus with peripheral circulatory disorder (HCC)   3. Hallux hammertoe, left             Plan:  Patient was evaluated and treated and all questions answered. Ulcer left hallux with fat layer exposed   X-ray reviewed. No obvious erosion noted. Small area plantar  hallux will keep an eye on.   -Debridement as below. -Dressed with hydrofera blue, DSD. -Off-loading with surgical shoe and offloading padding.  -Discussed trying flexor tenotomy today to see if this will help off-load the area a bit better. Procedure below.  -Has DM shoes but advised to hold off until we get this healed.  -No abx indicated.  -Discussed glucose control and proper protein-rich diet.  -Discussed if any worsening redness, pain, fever or chills to call or may need to report to the emergency room. Patient expressed understanding.   Procedure: Excisional Debridement of Wound Rationale: Removal of non-viable soft tissue from the wound to promote healing.  Anesthesia: none Pre-Debridement Wound Measurements: Overlying callus  Post-Debridement Wound Measurements: 0.4 cm x 0.2 cm x 0.2 cm  Type of Debridement: Sharp Excisional Tissue Removed: Non-viable soft tissue Depth of Debridement: subcutaneous tissue. Technique: Sharp excisional debridement to bleeding, viable wound base.  Dressing: Dry, sterile, compression dressing. Disposition: Patient tolerated procedure well. Patient to return in 2 week for follow-up.  Procedure Percutaneous Flexor Tenotomy ([id number]): Procedure: left hallux digit flexor tenotomy  Surgeon: Asberry Failing, DPM Pre-op Dx: left first digit hammertoe with digit ulceration Post-op: Same Place of Surgery: Office surgical suite  Indications for surgery: Flexible hammertoe deformity.  Findings: Digit was able to be straightened after tenotomy preformed.   Today, the risks, benefits, and complications of this procedure were explained, and written consent was obtained. The patient was then placed in a semi-reclined position, an the foot was prepped and draped in the usual aseptic manner. Attention was directed to the offending toe. No anesthesia needed due to neuropathy.  An 18 gauge needed was used in the plantar aspect of the digit and tenotomy was  preformed. Straightening of the toe was noted. Hemostasis was obtained by use of compression. Area was copiously irrigated. Digit was splinted with betadine   gauze and dry dressing.   Written and oral post-op instructions were given to the patient for the following home care:  1)Remove dressing tomorrow  2) Do not soak the area  3) Cover toe with band-aid if needed 4)Notify office if toe fails to improve or if there is an increased in redness or swelling.     Return in about 2 weeks (around 02/03/2024) for wound check.   Return in about 2 weeks (around 02/03/2024) for wound check.

## 2024-01-22 ENCOUNTER — Telehealth: Payer: Self-pay | Admitting: Lab

## 2024-01-22 ENCOUNTER — Other Ambulatory Visit: Payer: Self-pay | Admitting: Podiatry

## 2024-01-22 DIAGNOSIS — R262 Difficulty in walking, not elsewhere classified: Secondary | ICD-10-CM | POA: Diagnosis not present

## 2024-01-22 MED ORDER — GABAPENTIN 300 MG PO CAPS: 300.0000 mg | ORAL_CAPSULE | Freq: Two times a day (BID) | ORAL | 2 refills | Status: AC

## 2024-01-22 NOTE — Telephone Encounter (Signed)
 Patient states was seen in office 2 days ago and was told medication would be called in for pain and still has nothing ordered. Please review and advise.

## 2024-01-23 ENCOUNTER — Other Ambulatory Visit (HOSPITAL_COMMUNITY): Payer: Self-pay

## 2024-01-23 NOTE — Progress Notes (Signed)
 Paramedicine Encounter    Patient ID: Danielle [name], female    DOB: 20-Feb-1946, 78 y.o.   MRN: [id number]   Complaints- none   Assessment- CAOx4, warm and dry reporting to be feeling well with no complaints. She reports no chest pain, no dizziness, no shortness of breath just some soreness from PT. Lungs clear, vitals within normal.   Compliance with meds- missed one evening dose   Pill box filled- for one week   Refills needed- folic acid , levothyroxine , dexcom transmitter   Meds changes since last visit- none     Social changes- approved for [employer] transportation    VISIT SUMMARY- Arrived for home visit for [name] who reports to be feeling okay with no complaints other than soreness from PT. Vitals obtained as noted an assessment as noted. Lungs clear, no edema worse than baseline- she is wearing her compression socks daily. Has sleep study in September and needs CPAP she says she notices a difference in not having it. I reviewed meds and confirmed same filling pill box for one week.  CBG- 208  Dexcom placed. Appointments reviewed and confirmed. HF education provided. I will see [name] in one week.   BP 122/60   Pulse 70   Resp 16   Wt 198 lb (89.8 kg)   SpO2 94%   BMI 33.99 kg/m  Weight yesterday-- did not weigh  Last visit weight-- 204lbs      ACTION: Home visit completed     Patient Care Team: Rexanne Ingle, MD as PCP - General (Internal Medicine) Inocencio Soyla Lunger, MD as PCP - Electrophysiology (Cardiology) Shlomo Wilbert SAUNDERS, MD as PCP - Sleep Medicine (Cardiology) Bensimhon, Toribio SAUNDERS, MD as PCP - Cardiology (Cardiology) Cathern Andriette DEL, LCSW as Social Worker (Licensed Clinical Social Worker) Shamleffer, Donell Cardinal, MD as Consulting Physician (Endocrinology)  Patient Active Problem List   Diagnosis Date Noted   Chronic systolic heart failure (HCC) 05/08/2022   Type 2 diabetes mellitus with stage 3a chronic kidney disease, with long-term current use of  insulin  (HCC) 02/16/2022   Prosthetic joint implant failure, initial encounter (HCC) 12/20/2021   Pseudophakia of both eyes 11/15/2021   DVT (deep venous thrombosis) (HCC) 07/31/2021   Acute blood loss anemia    Septic Arthritis, Infection of prosthetic right knee joint (HCC), MSSA infection 07/10/2021   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) 07/10/2021   CAD (coronary artery disease) 06/08/2021   Carotid artery disease (HCC) 06/08/2021   Symptomatic bradycardia 06/08/2021   Paroxysmal atrial fibrillation (HCC) 06/08/2021   CKD (chronic kidney disease), stage III (HCC) 06/08/2021   Polyneuropathy associated with underlying disease (HCC) 05/04/2020   Severe nonproliferative diabetic retinopathy of left eye, with macular edema, associated with type 2 diabetes mellitus (HCC) 11/05/2019   Severe nonproliferative diabetic retinopathy of right eye, with macular edema, associated with type 2 diabetes mellitus (HCC) 11/05/2019   Retinal hemorrhage of right eye 11/05/2019   Retinal hemorrhage of left eye 11/05/2019   Diabetes mellitus (HCC) 05/14/2019   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, with long-term current use of insulin  (HCC) 05/14/2019   Type 2 diabetes mellitus with diabetic polyneuropathy, with long-term current use of insulin  (HCC) 05/14/2019   History of gout 05/20/2018   Primary localized osteoarthritis of right knee 05/13/2018   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) 12/20/2017   Obesity (BMI 30-39.9) 11/11/2017   OSA (obstructive sleep apnea) 02/18/2017   Essential hypertension    L Breast Cancer  Hypothyroidism     Current Outpatient Medications:    acetaminophen  (TYLENOL ) 500 MG tablet, Take 500-1,000 mg by mouth every 6 (six) hours as needed (for pain or headaches)., Disp: , Rfl:    acetic acid 2 % otic solution, Place 4 drops into the left ear daily., Disp: , Rfl:    allopurinol  (ZYLOPRIM ) 100 MG tablet, Take 100 mg by mouth  daily., Disp: , Rfl:    apixaban  (ELIQUIS ) 5 MG TABS tablet, Take 1 tablet (5 mg total) by mouth 2 (two) times daily., Disp: 180 tablet, Rfl: 3   atorvastatin  (LIPITOR ) 80 MG tablet, Take 1 tablet (80 mg total) by mouth at bedtime., Disp: 100 tablet, Rfl: 3   B Complex-Biotin -FA (B-COMPLEX PO), Take 1 tablet by mouth daily., Disp: , Rfl:    Biotin  10 MG TABS, Take 10 mg by mouth daily., Disp: , Rfl:    Blood Glucose Monitoring Suppl (ONETOUCH VERIO) w/Device KIT, Use to check blood sugar, Disp: 1 kit, Rfl: 0   carvedilol  (COREG ) 3.125 MG tablet, Take 1 tablet (3.125 mg total) by mouth 2 (two) times daily with a meal., Disp: 180 tablet, Rfl: 3   cetirizine (ZYRTEC) 10 MG tablet, Take 10 mg by mouth daily., Disp: , Rfl:    Cinnamon 500 MG capsule, Take 500 mg by mouth daily., Disp: , Rfl:    clotrimazole-betamethasone (LOTRISONE) lotion, Apply 1 application topically 2 (two) times daily as needed (itching)., Disp: , Rfl:    Continuous Glucose Sensor (DEXCOM G6 SENSOR) MISC, 1 Device by Does not apply route as directed., Disp: 9 each, Rfl: 3   Continuous Glucose Transmitter (DEXCOM G6 TRANSMITTER) MISC, Change every 90 days, Disp: 1 each, Rfl: 3   empagliflozin  (JARDIANCE ) 10 MG TABS tablet, Take 1 tablet (10 mg total) by mouth daily., Disp: 90 tablet, Rfl: 3   ferrous sulfate  325 (65 FE) MG tablet, Take 1 tablet (325 mg total) by mouth 3 (three) times a week., Disp: 45 tablet, Rfl: 3   folic acid  (FOLVITE ) 1 MG tablet, Take 1 tablet by mouth once daily, Disp: 90 tablet, Rfl: 3   gabapentin  (NEURONTIN ) 300 MG capsule, Take 1 capsule (300 mg total) by mouth 2 (two) times daily., Disp: 60 capsule, Rfl: 2   glucose blood (ONETOUCH VERIO) test strip, Check blood sugar 2 times daily, Disp: 100 each, Rfl: 12   insulin  aspart (NOVOLOG  FLEXPEN) 100 UNIT/ML FlexPen, DIAL  AND INJECT UNDER THE SKIN THREE TIMES DAILY PER CORRECTION SCALE. MAX DAILY DOSE 50 UNITS, Disp: 15 mL, Rfl: 0   Insulin  Glargine (BASAGLAR   KWIKPEN) 100 UNIT/ML, Inject 40 Units into the skin at bedtime., Disp: 45 mL, Rfl: 2   Insulin  Lispro (HUMALOG  [place]), Inject 8 Units into the skin in the morning, at noon, and at bedtime., Disp: , Rfl:    Insulin  Pen Needle 32G X 4 MM MISC, 1 Device by Does not apply route in the morning, at noon, in the evening, and at bedtime., Disp: 400 each, Rfl: 3   Lancets (ONETOUCH DELICA PLUS LANCET33G) MISC, USE   TO CHECK GLUCOSE 4 TIMES DAILY, Disp: 100 each, Rfl: 3   latanoprost  (XALATAN ) 0.005 % ophthalmic solution, Place 1 drop into both eyes at bedtime., Disp: , Rfl:    levothyroxine  (SYNTHROID , LEVOTHROID) 75 MCG tablet, Take 75 mcg by mouth daily before breakfast., Disp: , Rfl:    Multiple Vitamins-Minerals (ALIVE ONCE DAILY WOMENS 50+ PO), Take 1 tablet by mouth daily., Disp: , Rfl:    Omega-3  Fatty Acids (FISH OIL) 1200 MG CAPS, Take 1,200 mg by mouth at bedtime. Taking 2 capsules at night, Disp: , Rfl:    spironolactone  (ALDACTONE ) 25 MG tablet, Take 1 tablet (25 mg total) by mouth daily., Disp: 90 tablet, Rfl: 3   torsemide  (DEMADEX ) 20 MG tablet, Take 2 tablets (40 mg total) by mouth daily. May take extra 40 mg as needed for weight gain, increased swelling, or increased shortness of breath, Disp: 190 tablet, Rfl: 3   insulin  lispro (HUMALOG  KWIKPEN) 100 UNIT/ML KwikPen, Take HUmalog  8 units with each meal PLUS Humalog  correctional insulin  scale max daily dose 50 units (Patient not taking: Reported on 01/23/2024), Disp: 30 mL, Rfl: 3   LUMIGAN 0.01 % SOLN, Place 1 drop into both eyes at bedtime. (Patient not taking: Reported on 01/23/2024), Disp: , Rfl:    ondansetron  (ZOFRAN ) 4 MG tablet, Take 1 tablet (4 mg total) by mouth every 8 (eight) hours as needed for nausea or vomiting. (Patient not taking: Reported on 01/23/2024), Disp: 20 tablet, Rfl: 0   Travoprost, BAK Free, (TRAVATAN) 0.004 % SOLN ophthalmic solution, Place 1 drop into both eyes at bedtime. (Patient not taking: Reported on 01/23/2024),  Disp: , Rfl:  Allergies  Allergen Reactions   Codeine Other (See Comments)   Other Other (See Comments)    Blood Product Refusal (Jehovah's witness)     Tape Other (See Comments)    Leaves blisters and marks on the skin   Sulfa Antibiotics Rash   Uloric  [Febuxostat ] Rash     Social History   Socioeconomic History   Marital status: Widowed    Spouse name: Not on file   Number of children: 3   Years of education: Not on file   Highest education level: Some college, no degree  Occupational History   Occupation: retired  Tobacco Use   Smoking status: Former    Current packs/day: 0.00    Average packs/day: 1 pack/day for 22.0 years (22.0 ttl pk-yrs)    Types: Cigarettes    Start date: 74    Quit date: 1983    Years since quitting: 42.6   Smokeless tobacco: Never  Vaping Use   Vaping status: Never Used  Substance and Sexual Activity   Alcohol use: Not Currently    Comment: 05/22/2018 glass of wine couple times/wk   Drug use: Not Currently   Sexual activity: Not Currently    Birth control/protection: None, Post-menopausal  Other Topics Concern   Not on file  Social History Narrative   Not on file   Social Drivers of Health   Financial Resource Strain: Medium Risk (12/22/2021)   Overall Financial Resource Strain (CARDIA)    Difficulty of Paying Living Expenses: Somewhat hard  Food Insecurity: No Food Insecurity (05/08/2018)   Hunger Vital Sign    Worried About Running Out of Food in the Last Year: Never true    Ran Out of Food in the Last Year: Never true  Transportation Needs: Unmet Transportation Needs (05/08/2022)   PRAPARE - Administrator, Civil Service (Medical): Yes    Lack of Transportation (Non-Medical): Yes  Physical Activity: Not on file  Stress: Not on file  Social Connections: Not on file  Intimate Partner Violence: Not on file    Physical Exam      Future Appointments  Date Time Provider Department Center  02/04/2024  8:45 AM  Inocencio Soyla Lunger, MD [facility]  02/10/2024  1:45 PM Joya Stabs, DPM [facility]  02/16/2024  8:00 PM Shlomo Wilbert SAUNDERS, MD [facility]  04/14/2024  7:05 AM [facility] DEVICE REMOTES [facility]  07/14/2024  7:05 AM [facility] DEVICE REMOTES [facility]  10/13/2024  7:05 AM [facility] DEVICE REMOTES [facility]  01/12/2025  7:05 AM [facility] DEVICE REMOTES [facility]  04/13/2025  7:05 AM [facility] DEVICE REMOTES [facility]  07/13/2025  7:05 AM [facility] DEVICE REMOTES [facility]

## 2024-01-24 DIAGNOSIS — R262 Difficulty in walking, not elsewhere classified: Secondary | ICD-10-CM | POA: Diagnosis not present

## 2024-01-28 DIAGNOSIS — E1165 Type 2 diabetes mellitus with hyperglycemia: Secondary | ICD-10-CM | POA: Diagnosis not present

## 2024-01-29 ENCOUNTER — Other Ambulatory Visit (HOSPITAL_COMMUNITY): Payer: Self-pay

## 2024-01-29 ENCOUNTER — Telehealth (HOSPITAL_COMMUNITY): Payer: Self-pay

## 2024-01-29 DIAGNOSIS — R262 Difficulty in walking, not elsewhere classified: Secondary | ICD-10-CM | POA: Diagnosis not present

## 2024-01-29 MED ORDER — FOLIC ACID 1 MG PO TABS: 1.0000 mg | ORAL_TABLET | Freq: Every day | ORAL | 3 refills | Status: AC

## 2024-01-29 NOTE — Telephone Encounter (Signed)
 HF Paramedicine Message to [facility]  Pharmacy (if applicable): [facility]    Issue/reason for call:  RX needs to be sent in for Folic Acid    Medication refill? Yes- Folic Acid    Powell Mirza, EMT-Paramedic [phone] 01/29/2024

## 2024-01-30 ENCOUNTER — Telehealth (HOSPITAL_COMMUNITY): Payer: Self-pay | Admitting: Cardiology

## 2024-01-30 ENCOUNTER — Other Ambulatory Visit (HOSPITAL_COMMUNITY): Payer: Self-pay

## 2024-01-30 ENCOUNTER — Telehealth (HOSPITAL_COMMUNITY): Payer: Self-pay | Admitting: Internal Medicine

## 2024-01-30 ENCOUNTER — Telehealth (HOSPITAL_COMMUNITY): Payer: Self-pay

## 2024-01-30 DIAGNOSIS — I5022 Chronic systolic (congestive) heart failure: Secondary | ICD-10-CM

## 2024-01-30 DIAGNOSIS — Z008 Encounter for other general examination: Secondary | ICD-10-CM | POA: Diagnosis not present

## 2024-01-30 MED ORDER — TORSEMIDE 20 MG PO TABS: 60.0000 mg | ORAL_TABLET | Freq: Every day | ORAL | 3 refills | Status: DC

## 2024-01-30 NOTE — Telephone Encounter (Signed)
 Heather with para medicine called to report elevated weight during patients home visit    Weight today 205 (up x 7 lbs) Vitals stable NO SOB Edema present  Pt has been taking torsemide  40 daily with extra prn 40 in the PM at least twice a week with no relief     Please advise

## 2024-01-30 NOTE — Telephone Encounter (Signed)
 Please call.   Increase torsemide  to 60 mg daily. Repeat BMET next week.   Darnelle Derrick NP-C  11:06 AM

## 2024-01-30 NOTE — Addendum Note (Signed)
 Addended by: Kimari Lienhard, DALTON HERO on: 01/30/2024 11:18 AM   Modules accepted: Orders

## 2024-01-30 NOTE — Progress Notes (Signed)
 Paramedicine Encounter    Patient ID: Danielle [name], female    DOB: [birth year], 78 y.o.   MRN: [id number]   Complaints- toe pain and fatigue due to lack of sleep, neck and shoulder pain   Assessment- CAOX4, warm and dry cooking in her kitchen. She reports feeling okay but tired due lack of sleep, toe, neck and shoulder pain. Lungs clear, minimal lower leg edema. Vitals within normal. Up 7 lbs in one week.   Compliance with meds- no missed doses   Pill box filled- for one week   Refills needed- none   Meds changes since last visit- insulin  decrease Tuesday by Dr. [facility] (pending returned call from endo for dose as patient doesn't remember)      Social changes- none- doing well with PT    VISIT SUMMARY- Arrived for home visit for [facility] who reports to be feeling okay with toe, neck and shoulder pain and fatigue. She has lower leg edema and  7lbs weight gain. I contacted HF clinic and [name] Clegg advised increase torsemide  to 80mg  for three days then 60mg  there after. Labs scheduled for next week. I will follow up in home on Monday for dexcom replacement and Thursday for scheduled visit. I discussed HF education with her and reminded her about salt intake- she says she doesn't use salt but is making bacon for breakfast- I educated her on the importance of remembering food has salt processed in it and for her to be conscious of this and reading her food labels. She is not drinking over the fluid amount of 68 ounces so this is coming mostly from salt intake. She says she understood. I reviewed meds and filled pill box for one week with updated med changes. Home visit complete. She knows to reach out if needed and to weigh daily.     BP 122/70   Pulse (!) 50   Resp 18   Wt 205 lb (93 kg)   SpO2 96%   BMI 35.19 kg/m  Weight yesterday-- didn't weigh  Last visit weight-- 198lbs      ACTION: Home visit completed     Patient Care Team: Rexanne Ingle, MD as PCP - General (Internal  Medicine) Inocencio Soyla Lunger, MD as PCP - Electrophysiology (Cardiology) Shlomo Wilbert SAUNDERS, MD as PCP - Sleep Medicine (Cardiology) Bensimhon, Toribio SAUNDERS, MD as PCP - Cardiology (Cardiology) Cathern Andriette DEL, LCSW as Social Worker (Licensed Clinical Social Worker) Shamleffer, Donell Cardinal, MD as Consulting Physician (Endocrinology)  Patient Active Problem List   Diagnosis Date Noted   Chronic systolic heart failure (HCC) 05/08/2022   Type 2 diabetes mellitus with stage 3a chronic kidney disease, with long-term current use of insulin  (HCC) 02/16/2022   Prosthetic joint implant failure, initial encounter (HCC) 12/20/2021   Pseudophakia of both eyes 11/15/2021   DVT (deep venous thrombosis) (HCC) 07/31/2021   Acute blood loss anemia    Septic Arthritis, Infection of prosthetic right knee joint (HCC), MSSA infection 07/10/2021   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) 07/10/2021   CAD (coronary artery disease) 06/08/2021   Carotid artery disease (HCC) 06/08/2021   Symptomatic bradycardia 06/08/2021   Paroxysmal atrial fibrillation (HCC) 06/08/2021   CKD (chronic kidney disease), stage III (HCC) 06/08/2021   Polyneuropathy associated with underlying disease (HCC) 05/04/2020   Severe nonproliferative diabetic retinopathy of left eye, with macular edema, associated with type 2 diabetes mellitus (HCC) 11/05/2019   Severe nonproliferative diabetic retinopathy of right eye, with macular edema, associated with type  2 diabetes mellitus (HCC) 11/05/2019   Retinal hemorrhage of right eye 11/05/2019   Retinal hemorrhage of left eye 11/05/2019   Diabetes mellitus (HCC) 05/14/2019   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, with long-term current use of insulin  (HCC) 05/14/2019   Type 2 diabetes mellitus with diabetic polyneuropathy, with long-term current use of insulin  (HCC) 05/14/2019   History of gout 05/20/2018   Primary localized osteoarthritis of right knee 05/13/2018    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) 12/20/2017   Obesity (BMI 30-39.9) 11/11/2017   OSA (obstructive sleep apnea) 02/18/2017   Essential hypertension    L Breast Cancer    Hypothyroidism     Current Outpatient Medications:    acetaminophen  (TYLENOL ) 500 MG tablet, Take 500-1,000 mg by mouth every 6 (six) hours as needed (for pain or headaches)., Disp: , Rfl:    acetic acid 2 % otic solution, Place 4 drops into the left ear daily., Disp: , Rfl:    allopurinol  (ZYLOPRIM ) 100 MG tablet, Take 100 mg by mouth daily., Disp: , Rfl:    apixaban  (ELIQUIS ) 5 MG TABS tablet, Take 1 tablet (5 mg total) by mouth 2 (two) times daily., Disp: 180 tablet, Rfl: 3   atorvastatin  (LIPITOR ) 80 MG tablet, Take 1 tablet (80 mg total) by mouth at bedtime., Disp: 100 tablet, Rfl: 3   B Complex-Biotin -FA (B-COMPLEX PO), Take 1 tablet by mouth daily., Disp: , Rfl:    Biotin  10 MG TABS, Take 10 mg by mouth daily., Disp: , Rfl:    Blood Glucose Monitoring Suppl (ONETOUCH VERIO) w/Device KIT, Use to check blood sugar, Disp: 1 kit, Rfl: 0   carvedilol  (COREG ) 3.125 MG tablet, Take 1 tablet (3.125 mg total) by mouth 2 (two) times daily with a meal., Disp: 180 tablet, Rfl: 3   cetirizine (ZYRTEC) 10 MG tablet, Take 10 mg by mouth daily., Disp: , Rfl:    Cinnamon 500 MG capsule, Take 2,000 mg by mouth daily., Disp: , Rfl:    clotrimazole-betamethasone (LOTRISONE) lotion, Apply 1 application topically 2 (two) times daily as needed (itching)., Disp: , Rfl:    Continuous Glucose Sensor (DEXCOM G6 SENSOR) MISC, 1 Device by Does not apply route as directed., Disp: 9 each, Rfl: 3   Continuous Glucose Transmitter (DEXCOM G6 TRANSMITTER) MISC, Change every 90 days, Disp: 1 each, Rfl: 3   empagliflozin  (JARDIANCE ) 10 MG TABS tablet, Take 1 tablet (10 mg total) by mouth daily., Disp: 90 tablet, Rfl: 3   ferrous sulfate  325 (65 FE) MG tablet, Take 1 tablet (325 mg total) by mouth 3 (three) times a week., Disp: 45 tablet,  Rfl: 3   folic acid  (FOLVITE ) 1 MG tablet, Take 1 tablet (1 mg total) by mouth daily., Disp: 90 tablet, Rfl: 3   gabapentin  (NEURONTIN ) 300 MG capsule, Take 1 capsule (300 mg total) by mouth 2 (two) times daily., Disp: 60 capsule, Rfl: 2   glucose blood (ONETOUCH VERIO) test strip, Check blood sugar 2 times daily, Disp: 100 each, Rfl: 12   insulin  aspart (NOVOLOG  FLEXPEN) 100 UNIT/ML FlexPen, DIAL  AND INJECT UNDER THE SKIN THREE TIMES DAILY PER CORRECTION SCALE. MAX DAILY DOSE 50 UNITS, Disp: 15 mL, Rfl: 0   Insulin  Glargine (BASAGLAR  KWIKPEN) 100 UNIT/ML, Inject 40 Units into the skin at bedtime., Disp: 45 mL, Rfl: 2   Insulin  Pen Needle 32G X 4 MM MISC, 1 Device by Does not apply route in the morning, at noon, in the evening, and at bedtime.,  Disp: 400 each, Rfl: 3   Lancets (ONETOUCH DELICA PLUS LANCET33G) MISC, USE   TO CHECK GLUCOSE 4 TIMES DAILY, Disp: 100 each, Rfl: 3   latanoprost  (XALATAN ) 0.005 % ophthalmic solution, Place 1 drop into both eyes at bedtime., Disp: , Rfl:    levothyroxine  (SYNTHROID , LEVOTHROID) 75 MCG tablet, Take 75 mcg by mouth daily before breakfast., Disp: , Rfl:    Multiple Vitamins-Minerals (ALIVE ONCE DAILY WOMENS 50+ PO), Take 1 tablet by mouth daily., Disp: , Rfl:    Omega-3 Fatty Acids (FISH OIL) 1200 MG CAPS, Take 1,200 mg by mouth at bedtime. Taking 2 capsules at night, Disp: , Rfl:    spironolactone  (ALDACTONE ) 25 MG tablet, Take 1 tablet (25 mg total) by mouth daily., Disp: 90 tablet, Rfl: 3   insulin  lispro (HUMALOG  KWIKPEN) 100 UNIT/ML KwikPen, Take HUmalog  8 units with each meal PLUS Humalog  correctional insulin  scale max daily dose 50 units (Patient not taking: Reported on 01/30/2024), Disp: 30 mL, Rfl: 3   Insulin  Lispro (HUMALOG  [place]), Inject 8 Units into the skin in the morning, at noon, and at bedtime. (Patient not taking: Reported on 01/30/2024), Disp: , Rfl:    LUMIGAN 0.01 % SOLN, Place 1 drop into both eyes at bedtime. (Patient not taking: Reported on  01/23/2024), Disp: , Rfl:    ondansetron  (ZOFRAN ) 4 MG tablet, Take 1 tablet (4 mg total) by mouth every 8 (eight) hours as needed for nausea or vomiting. (Patient not taking: Reported on 01/23/2024), Disp: 20 tablet, Rfl: 0   torsemide  (DEMADEX ) 20 MG tablet, Take 3 tablets (60 mg total) by mouth daily., Disp: 90 tablet, Rfl: 3   Travoprost, BAK Free, (TRAVATAN) 0.004 % SOLN ophthalmic solution, Place 1 drop into both eyes at bedtime. (Patient not taking: Reported on 01/30/2024), Disp: , Rfl:  Allergies  Allergen Reactions   Codeine Other (See Comments)   Other Other (See Comments)    Blood Product Refusal (Jehovah's witness)     Tape Other (See Comments)    Leaves blisters and marks on the skin   Sulfa Antibiotics Rash   Uloric  [Febuxostat ] Rash     Social History   Socioeconomic History   Marital status: Widowed    Spouse name: Not on file   Number of children: 3   Years of education: Not on file   Highest education level: Some college, no degree  Occupational History   Occupation: retired  Tobacco Use   Smoking status: Former    Current packs/day: 0.00    Average packs/day: 1 pack/day for 22.0 years (22.0 ttl pk-yrs)    Types: Cigarettes    Start date: 44    Quit date: 1983    Years since quitting: 42.7   Smokeless tobacco: Never  Vaping Use   Vaping status: Never Used  Substance and Sexual Activity   Alcohol use: Not Currently    Comment: 05/22/2018 glass of wine couple times/wk   Drug use: Not Currently   Sexual activity: Not Currently    Birth control/protection: None, Post-menopausal  Other Topics Concern   Not on file  Social History Narrative   Not on file   Social Drivers of Health   Financial Resource Strain: Medium Risk (12/22/2021)   Overall Financial Resource Strain (CARDIA)    Difficulty of Paying Living Expenses: Somewhat hard  Food Insecurity: No Food Insecurity (05/08/2018)   Hunger Vital Sign    Worried About Running Out of Food in the Last  Year: Never true  Ran Out of Food in the Last Year: Never true  Transportation Needs: Unmet Transportation Needs (05/08/2022)   PRAPARE - Administrator, Civil Service (Medical): Yes    Lack of Transportation (Non-Medical): Yes  Physical Activity: Not on file  Stress: Not on file  Social Connections: Not on file  Intimate Partner Violence: Not on file    Physical Exam      Future Appointments  Date Time Provider Department Center  02/04/2024  8:45 AM Inocencio Soyla Lunger, MD [facility]  02/06/2024 10:45 AM [facility] None  02/10/2024  1:45 PM Joya Stabs, DPM [facility]  02/16/2024  8:00 PM Shlomo Wilbert SAUNDERS, MD [facility]  04/14/2024  7:05 AM [facility] DEVICE REMOTES [facility]  07/14/2024  7:05 AM [facility] DEVICE REMOTES [facility]  10/13/2024  7:05 AM [facility] DEVICE REMOTES [facility]  01/12/2025  7:05 AM [facility] DEVICE REMOTES [facility]  04/13/2025  7:05 AM [facility] DEVICE REMOTES [facility]  07/13/2025  7:05 AM [facility] DEVICE REMOTES [facility]

## 2024-01-30 NOTE — Telephone Encounter (Signed)
 Patient aware via Powell Will take 80mg  torsemide  x 3 days, then increase daily dose to 60 mg daily per Greig Mosses NP  Labs x 1 week

## 2024-01-30 NOTE — Telephone Encounter (Signed)
 Spoke to [employer] with Dr. Mikie office with [facility] and got an update on [place] insulin  doses.   Basaglar  45 units at night   Novolog  sliding scale CBG <150 7 units  CBG 150-200 10 units  CBG 201-250 12 units  CBG >250 15 units    I will update Anaya and give her a written copy of same on Monday when I replace her dexcom.   Powell Mirza, EMT-Paramedic [phone] 01/30/2024

## 2024-01-31 DIAGNOSIS — R262 Difficulty in walking, not elsewhere classified: Secondary | ICD-10-CM | POA: Diagnosis not present

## 2024-02-02 NOTE — Progress Notes (Unsigned)
 This encounter was created in error - please disregard.

## 2024-02-04 ENCOUNTER — Ambulatory Visit: Admitting: Cardiology

## 2024-02-04 ENCOUNTER — Other Ambulatory Visit (HOSPITAL_COMMUNITY): Payer: Self-pay

## 2024-02-04 NOTE — Progress Notes (Signed)
 Paramedicine Encounter    Patient ID: Danielle [name], female    DOB: 1945/05/31, 78 y.o.   MRN: [id number]    Compliance with meds- no missed doses   Pill box filled- for one week   Refills needed- none   Meds changes since last visit- torsemide  60mg  daily, insulin  sliding scale adjusted     Social changes- got transportation card with [employer]    VISIT SUMMARY- Med rec today and weight check in- she is down 11 lbs from last visit after torsemide  adjusted. I also educated on diabetic management and insulin  sliding scale. Wrote down instructions for her and reviewed same. Replaced dexcom today and educated on same. I plan to follow up after her labs on Thursday if any changes are made other wise we weill meet next Monday.   Wt 194 lb (88 kg)   BMI 33.30 kg/m  Last weight - 205lbs      ACTION: Home visit completed     Patient Care Team: Rexanne Ingle, MD as PCP - General (Internal Medicine) Inocencio Soyla Lunger, MD as PCP - Electrophysiology (Cardiology) Shlomo Wilbert SAUNDERS, MD as PCP - Sleep Medicine (Cardiology) Bensimhon, Toribio SAUNDERS, MD as PCP - Cardiology (Cardiology) Cathern Andriette DEL, LCSW as Social Worker (Licensed Clinical Social Worker) Shamleffer, Donell Cardinal, MD as Consulting Physician (Endocrinology)  Patient Active Problem List   Diagnosis Date Noted   Chronic systolic heart failure (HCC) 05/08/2022   Type 2 diabetes mellitus with stage 3a chronic kidney disease, with long-term current use of insulin  (HCC) 02/16/2022   Prosthetic joint implant failure, initial encounter (HCC) 12/20/2021   Pseudophakia of both eyes 11/15/2021   DVT (deep venous thrombosis) (HCC) 07/31/2021   Acute blood loss anemia    Septic Arthritis, Infection of prosthetic right knee joint (HCC), MSSA infection 07/10/2021   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) 07/10/2021   CAD (coronary artery disease) 06/08/2021   Carotid artery disease (HCC) 06/08/2021   Symptomatic  bradycardia 06/08/2021   Paroxysmal atrial fibrillation (HCC) 06/08/2021   CKD (chronic kidney disease), stage III (HCC) 06/08/2021   Polyneuropathy associated with underlying disease (HCC) 05/04/2020   Severe nonproliferative diabetic retinopathy of left eye, with macular edema, associated with type 2 diabetes mellitus (HCC) 11/05/2019   Severe nonproliferative diabetic retinopathy of right eye, with macular edema, associated with type 2 diabetes mellitus (HCC) 11/05/2019   Retinal hemorrhage of right eye 11/05/2019   Retinal hemorrhage of left eye 11/05/2019   Diabetes mellitus (HCC) 05/14/2019   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, with long-term current use of insulin  (HCC) 05/14/2019   Type 2 diabetes mellitus with diabetic polyneuropathy, with long-term current use of insulin  (HCC) 05/14/2019   History of gout 05/20/2018   Primary localized osteoarthritis of right knee 05/13/2018   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, Disp: 180 tablet, Rfl: 3   atorvastatin  (LIPITOR ) 80 MG tablet, Take 1 tablet (80 mg total) by mouth at bedtime., Disp: 100 tablet, Rfl: 3   B Complex-Biotin -FA (B-COMPLEX PO), Take 1 tablet by mouth daily., Disp: , Rfl:    Biotin  10 MG TABS, Take 10 mg by mouth daily., Disp: , Rfl:    Blood Glucose Monitoring Suppl (ONETOUCH VERIO) w/Device KIT, Use to  check blood sugar, Disp: 1 kit, Rfl: 0   carvedilol  (COREG ) 3.125 MG tablet, Take 1 tablet (3.125 mg total) by mouth 2 (two) times daily with a meal., Disp: 180 tablet, Rfl: 3   cetirizine (ZYRTEC) 10 MG tablet, Take 10 mg by mouth daily., Disp: , Rfl:    Cinnamon 500 MG capsule, Take 2,000 mg by mouth daily., Disp: , Rfl:    clotrimazole-betamethasone (LOTRISONE) lotion, Apply 1 application topically 2 (two) times daily as needed (itching)., Disp: , Rfl:    Continuous Glucose Sensor (DEXCOM G6 SENSOR) MISC, 1 Device by Does not apply route as directed., Disp: 9 each, Rfl: 3   Continuous Glucose Transmitter (DEXCOM G6 TRANSMITTER) MISC, Change every 90 days, Disp: 1 each, Rfl: 3   empagliflozin  (JARDIANCE ) 10 MG TABS tablet, Take 1 tablet (10 mg total) by mouth daily., Disp: 90 tablet, Rfl: 3   ferrous sulfate  325 (65 FE) MG tablet, Take 1 tablet (325 mg total) by mouth 3 (three) times a week., Disp: 45 tablet, Rfl: 3   folic acid  (FOLVITE ) 1 MG tablet, Take 1 tablet (1 mg total) by mouth daily., Disp: 90 tablet, Rfl: 3   gabapentin  (NEURONTIN ) 300 MG capsule, Take 1 capsule (300 mg total) by mouth 2 (two) times daily., Disp: 60 capsule, Rfl: 2   glucose blood (ONETOUCH VERIO) test strip, Check blood sugar 2 times daily, Disp: 100 each, Rfl: 12   insulin  aspart (NOVOLOG  FLEXPEN) 100 UNIT/ML FlexPen, DIAL  AND INJECT UNDER THE SKIN THREE TIMES DAILY PER CORRECTION SCALE. MAX DAILY DOSE 50 UNITS, Disp: 15 mL, Rfl: 0   Insulin  Glargine (BASAGLAR  KWIKPEN) 100 UNIT/ML, Inject 40 Units into the skin at bedtime., Disp: 45 mL, Rfl: 2   insulin  lispro (HUMALOG  KWIKPEN) 100 UNIT/ML KwikPen, Take HUmalog  8 units with each meal PLUS Humalog  correctional insulin  scale max daily dose 50 units (Patient not taking: Reported on 01/30/2024), Disp: 30 mL, Rfl: 3   Insulin  Lispro (HUMALOG  [place]), Inject 8 Units into the skin in the morning, at noon, and at bedtime. (Patient not taking: Reported on 01/30/2024), Disp: , Rfl:     Insulin  Pen Needle 32G X 4 MM MISC, 1 Device by Does not apply route in the morning, at noon, in the evening, and at bedtime., Disp: 400 each, Rfl: 3   Lancets (ONETOUCH DELICA PLUS LANCET33G) MISC, USE   TO CHECK GLUCOSE 4 TIMES DAILY, Disp: 100 each, Rfl: 3   latanoprost  (XALATAN ) 0.005 % ophthalmic solution, Place 1 drop into both eyes at bedtime., Disp: , Rfl:    levothyroxine  (SYNTHROID , LEVOTHROID) 75 MCG tablet, Take 75 mcg by mouth daily before breakfast., Disp: , Rfl:    LUMIGAN 0.01 % SOLN, Place 1 drop into both eyes at bedtime. (Patient not taking: Reported on 01/23/2024), Disp: , Rfl:    Multiple Vitamins-Minerals (ALIVE ONCE DAILY WOMENS 50+ PO), Take 1 tablet by mouth daily., Disp: , Rfl:    Omega-3 Fatty Acids (FISH OIL) 1200 MG CAPS, Take 1,200 mg by mouth at bedtime. Taking 2 capsules  at night, Disp: , Rfl:    ondansetron  (ZOFRAN ) 4 MG tablet, Take 1 tablet (4 mg total) by mouth every 8 (eight) hours as needed for nausea or vomiting. (Patient not taking: Reported on 01/23/2024), Disp: 20 tablet, Rfl: 0   spironolactone  (ALDACTONE ) 25 MG tablet, Take 1 tablet (25 mg total) by mouth daily., Disp: 90 tablet, Rfl: 3   torsemide  (DEMADEX ) 20 MG tablet, Take 3 tablets (60 mg total) by mouth daily., Disp: 90 tablet, Rfl: 3   Travoprost, BAK Free, (TRAVATAN) 0.004 % SOLN ophthalmic solution, Place 1 drop into both eyes at bedtime. (Patient not taking: Reported on 01/30/2024), Disp: , Rfl:  Allergies  Allergen Reactions   Codeine Other (See Comments)   Other Other (See Comments)    Blood Product Refusal (Jehovah's witness)     Tape Other (See Comments)    Leaves blisters and marks on the skin   Sulfa Antibiotics Rash   Uloric  [Febuxostat ] Rash     Social History   Socioeconomic History   Marital status: Widowed    Spouse name: Not on file   Number of children: 3   Years of education: Not on file   Highest education level: Some college, no degree  Occupational History    Occupation: retired  Tobacco Use   Smoking status: Former    Current packs/day: 0.00    Average packs/day: 1 pack/day for 22.0 years (22.0 ttl pk-yrs)    Types: Cigarettes    Start date: 56    Quit date: 1983    Years since quitting: 42.7   Smokeless tobacco: Never  Vaping Use   Vaping status: Never Used  Substance and Sexual Activity   Alcohol use: Not Currently    Comment: 05/22/2018 glass of wine couple times/wk   Drug use: Not Currently   Sexual activity: Not Currently    Birth control/protection: None, Post-menopausal  Other Topics Concern   Not on file  Social History Narrative   Not on file   Social Drivers of Health   Financial Resource Strain: Medium Risk (12/22/2021)   Overall Financial Resource Strain (CARDIA)    Difficulty of Paying Living Expenses: Somewhat hard  Food Insecurity: No Food Insecurity (05/08/2018)   Hunger Vital Sign    Worried About Running Out of Food in the Last Year: Never true    Ran Out of Food in the Last Year: Never true  Transportation Needs: Unmet Transportation Needs (05/08/2022)   PRAPARE - Administrator, Civil Service (Medical): Yes    Lack of Transportation (Non-Medical): Yes  Physical Activity: Not on file  Stress: Not on file  Social Connections: Not on file  Intimate Partner Violence: Not on file    Physical Exam      Future Appointments  Date Time Provider Department Center  02/06/2024 10:45 AM [facility] None  02/10/2024  1:45 PM Joya Stabs, DPM [facility]  02/16/2024  8:00 PM Shlomo Wilbert SAUNDERS, MD [facility]  02/20/2024  8:45 AM Inocencio Soyla Lunger, MD [facility]  04/14/2024  7:05 AM [facility] DEVICE REMOTES [facility]  07/14/2024  7:05 AM [facility] DEVICE REMOTES [facility]  10/13/2024  7:05 AM [facility] DEVICE REMOTES [facility]  01/12/2025  7:05 AM [facility] DEVICE REMOTES [facility]  04/13/2025  7:05 AM [facility] DEVICE REMOTES [facility]  07/13/2025  7:05 AM [facility]  DEVICE REMOTES [facility]

## 2024-02-06 ENCOUNTER — Other Ambulatory Visit (HOSPITAL_COMMUNITY)

## 2024-02-06 ENCOUNTER — Ambulatory Visit (HOSPITAL_COMMUNITY)
Admission: RE | Admit: 2024-02-06 | Discharge: 2024-02-06 | Disposition: A | Discharge status: Home / Self Care | Source: Ambulatory Visit | Attending: Cardiology | Admitting: Cardiology

## 2024-02-06 DIAGNOSIS — I5022 Chronic systolic (congestive) heart failure: Secondary | ICD-10-CM | POA: Diagnosis not present

## 2024-02-06 LAB — BASIC METABOLIC PANEL WITH GFR
Anion gap: 11 (ref 5–15)
BUN: 38 mg/dL — ABNORMAL HIGH (ref 8–23)
CO2: 28 mmol/L (ref 22–32)
Calcium: 9.3 mg/dL (ref 8.9–10.3)
Chloride: 101 mmol/L (ref 98–111)
Creatinine, Ser: 1.57 mg/dL — ABNORMAL HIGH (ref 0.44–1.00)
GFR, Estimated: 34 mL/min — ABNORMAL LOW (ref >60)
Glucose, Bld: 130 mg/dL — ABNORMAL HIGH (ref 70–99)
Potassium: 4.1 mmol/L (ref 3.5–5.1)
Sodium: 140 mmol/L (ref 135–145)

## 2024-02-10 ENCOUNTER — Encounter: Payer: Self-pay | Admitting: Podiatry

## 2024-02-10 ENCOUNTER — Ambulatory Visit (INDEPENDENT_AMBULATORY_CARE_PROVIDER_SITE_OTHER): Admitting: Podiatry

## 2024-02-10 ENCOUNTER — Ambulatory Visit (HOSPITAL_COMMUNITY): Payer: Self-pay | Admitting: Adult Health

## 2024-02-10 VITALS — BP 142/78 | HR 78 | Temp 97.9°F

## 2024-02-10 DIAGNOSIS — E1151 Type 2 diabetes mellitus with diabetic peripheral angiopathy without gangrene: Secondary | ICD-10-CM

## 2024-02-10 DIAGNOSIS — L84 Corns and callosities: Secondary | ICD-10-CM

## 2024-02-10 NOTE — Progress Notes (Signed)
 Subjective:  Patient ID: Danielle [name], female    DOB: 09-Feb-1946,  MRN: [id number]  Chief Complaint  Patient presents with   Routine Post Op    POV#1 DOS 01/20/2024  Tenotomy 1st left It be hurting.  I have called I don't know how many times for a prescription for pain.  I have taken so much Tylenol  but it is not doing any good.    78 y.o. female returns for follow-up of left great toe wound.   Relates doing ok. Tenotoy site doing well.  SABRA Has been dressing the great toe as instructed. Patient is diabetic and last A1c was  Lab Results  Component Value Date   HGBA1C 10.2 (A) 11/09/2022   .   PCP:  Rexanne Ingle, MD      Review of Systems: Negative except as noted in the HPI. Denies N/V/F/Ch.  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HCC) 02/05/2017   Acute gout due to renal impairment involving right wrist 05/20/2018   Acute kidney failure (HCC) 05/15/2018   Arthritis    all over (05/22/2018)   Blood dyscrasia    per pt-has small blood cells-appears as if anemic   Breast cancer, left breast (HCC) 1985   CHF (congestive heart failure) (HCC)    Complication of anesthesia    difficult to awaken from per pt   Coronary artery disease    Dyspnea    Essential hypertension    Heart disease    Heart murmur    History of gout    Hypothyroidism    Obesity (BMI 30-39.9) 11/11/2017   OSA (obstructive sleep apnea) 02/18/2017    severe obstructive sleep apnea with an AHI of 75.6/h and mild central sleep apnea with a CAI of 7.7/h.  Oxygen saturations dropped as low as 82%.   He is on CPAP at 9 cm H2O.   OSA on CPAP    Personal history of chemotherapy    Personal history of radiation therapy    Presence of permanent cardiac pacemaker    Primary localized osteoarthritis of right knee 05/13/2018   Refusal of blood transfusions as patient is Jehovah's Witness    Type II diabetes mellitus (HCC)     Current Outpatient  Medications:    acetaminophen  (TYLENOL ) 500 MG tablet, Take 500-1,000 mg by mouth every 6 (six) hours as needed (for pain or headaches)., Disp: , Rfl:    acetic acid 2 % otic solution, Place 4 drops into the left ear daily., Disp: , Rfl:    allopurinol  (ZYLOPRIM ) 100 MG tablet, Take 100 mg by mouth daily., Disp: , Rfl:    apixaban  (ELIQUIS ) 5 MG TABS tablet, Take 1 tablet (5 mg total) by mouth 2 (two) times daily., Disp: 180 tablet, Rfl: 3   atorvastatin  (LIPITOR ) 80 MG tablet, Take 1 tablet (80 mg total) by mouth at bedtime., Disp: 100 tablet, Rfl: 3   B Complex-Biotin -FA (B-COMPLEX PO), Take 1 tablet by mouth daily., Disp: , Rfl:    Biotin  10 MG TABS, Take 10 mg by mouth daily., Disp: , Rfl:    Blood Glucose Monitoring Suppl (ONETOUCH VERIO) w/Device KIT, Use to check blood sugar, Disp: 1 kit, Rfl: 0   carvedilol  (COREG ) 3.125 MG tablet, Take 1 tablet (3.125 mg total) by mouth 2 (two) times daily with a meal., Disp: 180 tablet, Rfl: 3   cetirizine (ZYRTEC) 10 MG tablet, Take 10 mg by mouth daily., Disp: , Rfl:  Cinnamon 500 MG capsule, Take 2,000 mg by mouth daily., Disp: , Rfl:    clotrimazole-betamethasone (LOTRISONE) lotion, Apply 1 application topically 2 (two) times daily as needed (itching)., Disp: , Rfl:    Continuous Glucose Sensor (DEXCOM G6 SENSOR) MISC, 1 Device by Does not apply route as directed., Disp: 9 each, Rfl: 3   Continuous Glucose Transmitter (DEXCOM G6 TRANSMITTER) MISC, Change every 90 days, Disp: 1 each, Rfl: 3   empagliflozin  (JARDIANCE ) 10 MG TABS tablet, Take 1 tablet (10 mg total) by mouth daily., Disp: 90 tablet, Rfl: 3   ferrous sulfate  325 (65 FE) MG tablet, Take 1 tablet (325 mg total) by mouth 3 (three) times a week., Disp: 45 tablet, Rfl: 3   folic acid  (FOLVITE ) 1 MG tablet, Take 1 tablet (1 mg total) by mouth daily., Disp: 90 tablet, Rfl: 3   gabapentin  (NEURONTIN ) 300 MG capsule, Take 1 capsule (300 mg total) by mouth 2 (two) times daily., Disp: 60 capsule,  Rfl: 2   glucose blood (ONETOUCH VERIO) test strip, Check blood sugar 2 times daily, Disp: 100 each, Rfl: 12   insulin  aspart (NOVOLOG  FLEXPEN) 100 UNIT/ML FlexPen, DIAL  AND INJECT UNDER THE SKIN THREE TIMES DAILY PER CORRECTION SCALE. MAX DAILY DOSE 50 UNITS, Disp: 15 mL, Rfl: 0   Insulin  Glargine (BASAGLAR  KWIKPEN) 100 UNIT/ML, Inject 40 Units into the skin at bedtime., Disp: 45 mL, Rfl: 2   Insulin  Pen Needle 32G X 4 MM MISC, 1 Device by Does not apply route in the morning, at noon, in the evening, and at bedtime., Disp: 400 each, Rfl: 3   Lancets (ONETOUCH DELICA PLUS LANCET33G) MISC, USE   TO CHECK GLUCOSE 4 TIMES DAILY, Disp: 100 each, Rfl: 3   latanoprost  (XALATAN ) 0.005 % ophthalmic solution, Place 1 drop into both eyes at bedtime., Disp: , Rfl:    levothyroxine  (SYNTHROID , LEVOTHROID) 75 MCG tablet, Take 75 mcg by mouth daily before breakfast., Disp: , Rfl:    Multiple Vitamins-Minerals (ALIVE ONCE DAILY WOMENS 50+ PO), Take 1 tablet by mouth daily., Disp: , Rfl:    Omega-3 Fatty Acids (FISH OIL) 1200 MG CAPS, Take 1,200 mg by mouth at bedtime. Taking 2 capsules at night, Disp: , Rfl:    spironolactone  (ALDACTONE ) 25 MG tablet, Take 1 tablet (25 mg total) by mouth daily., Disp: 90 tablet, Rfl: 3   torsemide  (DEMADEX ) 20 MG tablet, Take 3 tablets (60 mg total) by mouth daily., Disp: 90 tablet, Rfl: 3   insulin  lispro (HUMALOG  KWIKPEN) 100 UNIT/ML KwikPen, Take HUmalog  8 units with each meal PLUS Humalog  correctional insulin  scale max daily dose 50 units (Patient not taking: Reported on 02/10/2024), Disp: 30 mL, Rfl: 3   Insulin  Lispro (HUMALOG  [place]), Inject 8 Units into the skin in the morning, at noon, and at bedtime. (Patient not taking: Reported on 02/10/2024), Disp: , Rfl:    LUMIGAN 0.01 % SOLN, Place 1 drop into both eyes at bedtime. (Patient not taking: Reported on 02/10/2024), Disp: , Rfl:    ondansetron  (ZOFRAN ) 4 MG tablet, Take 1 tablet (4 mg total) by mouth every 8 (eight) hours as  needed for nausea or vomiting. (Patient not taking: Reported on 02/10/2024), Disp: 20 tablet, Rfl: 0   Travoprost, BAK Free, (TRAVATAN) 0.004 % SOLN ophthalmic solution, Place 1 drop into both eyes at bedtime. (Patient not taking: Reported on 02/10/2024), Disp: , Rfl:   Social History   Tobacco Use  Smoking Status Former   Current packs/day: 0.00  Average packs/day: 1 pack/day for 22.0 years (22.0 ttl pk-yrs)   Types: Cigarettes   Start date: 85   Quit date: 81   Years since quitting: 42.7  Smokeless Tobacco Never    Allergies  Allergen Reactions   Codeine Other (See Comments)   Other Other (See Comments)    Blood Product Refusal (Jehovah's witness)     Tape Other (See Comments)    Leaves blisters and marks on the skin   Sulfa Antibiotics Rash   Uloric  [Febuxostat ] Rash   Objective:  Vascular: DP/PT pulses 2/4 bilateral. CFT <3 seconds. Absent hair growth on digits. Edema noted to bilateral lower extremities. Xerosis noted bilaterally.  Skin. No lacerations or abrasions bilateral feet. Nails 1-5 bilateral  are thickened discolored and elongated with subungual debris. Left second digit amputation . Left hallux with hyperkeratosis noted and upon debridedment ulceration healed.  Musculoskeletal: MMT 5/5 bilateral lower extremities in DF, PF, Inversion and Eversion. Deceased ROM in DF of ankle joint. Left second digit amputation.  No tenderness erythema or edema noted to the great toe currently.  Neurological: Sensation intact to light touch. Protective sensation diminished bilateral.   Assessment:   1. Skin ulcer of left great toe with fat layer exposed (HCC)   2. Type II diabetes mellitus with peripheral circulatory disorder [facility])              Plan:  Patient was evaluated and treated and all questions answered. Ulcer left hallux with fat layer exposed   X-ray reviewed. No obvious erosion noted. Small area plantar hallux will keep an eye on.   -Debridement of  hyperkeratotic tissue with underlying ulceration healed.  -Offloading with diabetic shoes.  -Tenotomy site well healed.  -Discussed glucose control and proper protein-rich diet.  -Discussed if any worsening redness, pain, fever or chills to call or may need to report to the emergency room. Patient expressed understanding.    Return in 4 weeks for recheck.    No follow-ups on file.   No follow-ups on file.   No follow-ups on file.

## 2024-02-12 DIAGNOSIS — R262 Difficulty in walking, not elsewhere classified: Secondary | ICD-10-CM | POA: Diagnosis not present

## 2024-02-13 ENCOUNTER — Other Ambulatory Visit (HOSPITAL_COMMUNITY): Payer: Self-pay

## 2024-02-13 ENCOUNTER — Encounter: Payer: Self-pay | Admitting: Podiatry

## 2024-02-13 MED ORDER — GABAPENTIN 300 MG PO CAPS: 600.0000 mg | ORAL_CAPSULE | Freq: Two times a day (BID) | ORAL | 3 refills | Status: AC

## 2024-02-13 NOTE — Progress Notes (Signed)
 Created in error

## 2024-02-13 NOTE — Progress Notes (Signed)
 Paramedicine Encounter    Patient ID: Danielle [name], female    DOB: 01-05-1946, 78 y.o.   MRN: [id number]   Complaints-sore from therapy   Assessment- CAOX4, warm and dry seated at her kitchen table reporting to be feeling well with some muscle soreness from therapy yesterday but overall feeling well. Her weight is up 9lbs from last week but she has been non compliant with her meds and admits to missing doses. Lungs clear, vitals obtained. No lower leg edema. Abdomen soft non tender, no JVD.   Compliance with meds- non compliant   Pill box filled- for one week   Refills needed- gabapentin , allopurinol , atorvastatin    Meds changes since last visit- increase in gabapentin  today by Dr. Sikora 600mg  BID.     Social changes- none    VISIT SUMMARY- Arrived for home visit for [facility] who reports to be feeling well other than muscle soreness from PT. Vitals and assessment obtained as noted. Weight increased this week but no outward signs of fluid retention. She admitted to missing doses of her meds. I reviewed same and discussed compliance. I filled pill box filled for one week. We reviewed upcoming appointments and confirmed same. I reviewed HF and DM management and changed out her dexcom. We reviewed her insulin  dosings. She has not had a BM in two days. We discussed using metamucil and increasing her fiber. She agreed. Home visit complete. I will see her in one week at Dr. Inocencio office.   BP 130/62   Pulse 68   Resp 16   Wt 203 lb (92.1 kg)   SpO2 94%   BMI 34.84 kg/m  Weight yesterday-- didn't weigh  Last visit weight-194lbs      ACTION: Home visit completed     Patient Care Team: Rexanne Ingle, MD as PCP - General (Internal Medicine) Inocencio Soyla Lunger, MD as PCP - Electrophysiology (Cardiology) Shlomo Wilbert SAUNDERS, MD as PCP - Sleep Medicine (Cardiology) Bensimhon, Toribio SAUNDERS, MD as PCP - Cardiology (Cardiology) Cathern Andriette DEL, LCSW as Social Worker (Licensed Clinical Social  Worker) Shamleffer, Donell Cardinal, MD as Consulting Physician (Endocrinology)  Patient Active Problem List   Diagnosis Date Noted   Chronic systolic heart failure (HCC) 05/08/2022   Type 2 diabetes mellitus with stage 3a chronic kidney disease, with long-term current use of insulin  (HCC) 02/16/2022   Prosthetic joint implant failure, initial encounter (HCC) 12/20/2021   Pseudophakia of both eyes 11/15/2021   DVT (deep venous thrombosis) (HCC) 07/31/2021   Acute blood loss anemia    Septic Arthritis, Infection of prosthetic right knee joint (HCC), MSSA infection 07/10/2021   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) 07/10/2021   CAD (coronary artery disease) 06/08/2021   Carotid artery disease (HCC) 06/08/2021   Symptomatic bradycardia 06/08/2021   Paroxysmal atrial fibrillation (HCC) 06/08/2021   CKD (chronic kidney disease), stage III (HCC) 06/08/2021   Polyneuropathy associated with underlying disease (HCC) 05/04/2020   Severe nonproliferative diabetic retinopathy of left eye, with macular edema, associated with type 2 diabetes mellitus (HCC) 11/05/2019   Severe nonproliferative diabetic retinopathy of right eye, with macular edema, associated with type 2 diabetes mellitus (HCC) 11/05/2019   Retinal hemorrhage of right eye 11/05/2019   Retinal hemorrhage of left eye 11/05/2019   Diabetes mellitus (HCC) 05/14/2019   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, with long-term current use of insulin  (HCC) 05/14/2019   Type 2 diabetes mellitus with diabetic polyneuropathy, with long-term current use of insulin  (HCC) 05/14/2019  History of gout 05/20/2018   Primary localized osteoarthritis of right knee 05/13/2018   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, Disp: 180 tablet, Rfl: 3   atorvastatin  (LIPITOR ) 80 MG tablet, Take 1 tablet (80 mg total) by mouth at bedtime., Disp: 100 tablet, Rfl: 3   B Complex-Biotin -FA (B-COMPLEX PO), Take 1 tablet by mouth daily., Disp: , Rfl:    Biotin  10 MG TABS, Take 10 mg by mouth daily., Disp: , Rfl:    Blood Glucose Monitoring Suppl (ONETOUCH VERIO) w/Device KIT, Use to check blood sugar, Disp: 1 kit, Rfl: 0   carvedilol  (COREG ) 3.125 MG tablet, Take 1 tablet (3.125 mg total) by mouth 2 (two) times daily with a meal., Disp: 180 tablet, Rfl: 3   cetirizine (ZYRTEC) 10 MG tablet, Take 10 mg by mouth daily., Disp: , Rfl:    Cinnamon 500 MG capsule, Take 2,000 mg by mouth daily., Disp: , Rfl:    clotrimazole-betamethasone (LOTRISONE) lotion, Apply 1 application topically 2 (two) times daily as needed (itching)., Disp: , Rfl:    Continuous Glucose Sensor (DEXCOM G6 SENSOR) MISC, 1 Device by Does not apply route as directed., Disp: 9 each, Rfl: 3   Continuous Glucose Transmitter (DEXCOM G6 TRANSMITTER) MISC, Change every 90 days, Disp: 1 each, Rfl: 3   empagliflozin  (JARDIANCE ) 10 MG TABS tablet, Take 1 tablet (10 mg total) by mouth daily., Disp: 90 tablet, Rfl: 3   ferrous sulfate  325 (65 FE) MG tablet, Take 1 tablet (325 mg total) by mouth 3 (three) times a week., Disp: 45 tablet, Rfl: 3   folic acid  (FOLVITE ) 1 MG tablet, Take 1 tablet (1 mg total) by mouth daily., Disp: 90 tablet, Rfl: 3   gabapentin  (NEURONTIN ) 300 MG capsule, Take 1 capsule (300 mg total) by mouth 2 (two) times daily., Disp: 60 capsule, Rfl: 2   glucose  blood (ONETOUCH VERIO) test strip, Check blood sugar 2 times daily, Disp: 100 each, Rfl: 12   insulin  aspart (NOVOLOG  FLEXPEN) 100 UNIT/ML FlexPen, DIAL  AND INJECT UNDER THE SKIN THREE TIMES DAILY PER CORRECTION SCALE. MAX DAILY DOSE 50 UNITS, Disp: 15 mL, Rfl: 0   Insulin  Glargine (BASAGLAR  KWIKPEN) 100 UNIT/ML, Inject 40 Units into the skin at bedtime., Disp: 45 mL, Rfl: 2   Insulin  Pen Needle 32G X 4 MM MISC, 1 Device by Does not apply route in the morning, at noon, in the evening, and at bedtime., Disp: 400 each, Rfl: 3   Lancets (ONETOUCH DELICA PLUS LANCET33G) MISC, USE   TO CHECK GLUCOSE 4 TIMES DAILY, Disp: 100 each, Rfl: 3   latanoprost  (XALATAN ) 0.005 % ophthalmic solution, Place 1 drop into both eyes at bedtime., Disp: , Rfl:    levothyroxine  (SYNTHROID , LEVOTHROID) 75 MCG tablet, Take 75 mcg by mouth daily before breakfast.,  Disp: , Rfl:    Multiple Vitamins-Minerals (ALIVE ONCE DAILY WOMENS 50+ PO), Take 1 tablet by mouth daily., Disp: , Rfl:    Omega-3 Fatty Acids (FISH OIL) 1200 MG CAPS, Take 1,200 mg by mouth at bedtime. Taking 2 capsules at night, Disp: , Rfl:    spironolactone  (ALDACTONE ) 25 MG tablet, Take 1 tablet (25 mg total) by mouth daily., Disp: 90 tablet, Rfl: 3   torsemide  (DEMADEX ) 20 MG tablet, Take 3 tablets (60 mg total) by mouth daily., Disp: 90 tablet, Rfl: 3   gabapentin  (NEURONTIN ) 300 MG capsule, Take 2 capsules (600 mg total) by mouth 2 (two) times daily., Disp: 360 capsule, Rfl: 3   insulin  lispro (HUMALOG  KWIKPEN) 100 UNIT/ML KwikPen, Take HUmalog  8 units with each meal PLUS Humalog  correctional insulin  scale max daily dose 50 units (Patient not taking: Reported on 02/10/2024), Disp: 30 mL, Rfl: 3   Insulin  Lispro (HUMALOG  [place]), Inject 8 Units into the skin in the morning, at noon, and at bedtime. (Patient not taking: Reported on 02/10/2024), Disp: , Rfl:    LUMIGAN 0.01 % SOLN, Place 1 drop into both eyes at bedtime. (Patient not taking: Reported on 02/10/2024),  Disp: , Rfl:    ondansetron  (ZOFRAN ) 4 MG tablet, Take 1 tablet (4 mg total) by mouth every 8 (eight) hours as needed for nausea or vomiting. (Patient not taking: Reported on 02/10/2024), Disp: 20 tablet, Rfl: 0   Travoprost, BAK Free, (TRAVATAN) 0.004 % SOLN ophthalmic solution, Place 1 drop into both eyes at bedtime. (Patient not taking: Reported on 02/10/2024), Disp: , Rfl:  Allergies  Allergen Reactions   Codeine Other (See Comments)   Other Other (See Comments)    Blood Product Refusal (Jehovah's witness)     Tape Other (See Comments)    Leaves blisters and marks on the skin   Sulfa Antibiotics Rash   Uloric  [Febuxostat ] Rash     Social History   Socioeconomic History   Marital status: Widowed    Spouse name: Not on file   Number of children: 3   Years of education: Not on file   Highest education level: Some college, no degree  Occupational History   Occupation: retired  Tobacco Use   Smoking status: Former    Current packs/day: 0.00    Average packs/day: 1 pack/day for 22.0 years (22.0 ttl pk-yrs)    Types: Cigarettes    Start date: 27    Quit date: 1983    Years since quitting: 42.7   Smokeless tobacco: Never  Vaping Use   Vaping status: Never Used  Substance and Sexual Activity   Alcohol use: Not Currently    Comment: 05/22/2018 glass of wine couple times/wk   Drug use: Not Currently   Sexual activity: Not Currently    Birth control/protection: None, Post-menopausal  Other Topics Concern   Not on file  Social History Narrative   Not on file   Social Drivers of Health   Financial Resource Strain: Medium Risk (12/22/2021)   Overall Financial Resource Strain (CARDIA)    Difficulty of Paying Living Expenses: Somewhat hard  Food Insecurity: No Food Insecurity (05/08/2018)   Hunger Vital Sign    Worried About Running Out of Food in the Last Year: Never true    Ran Out of Food in the Last Year: Never true  Transportation Needs: Unmet Transportation Needs  (05/08/2022)   PRAPARE - Administrator, Civil Service (Medical): Yes    Lack of Transportation (Non-Medical):  Yes  Physical Activity: Not on file  Stress: Not on file  Social Connections: Not on file  Intimate Partner Violence: Not on file    Physical Exam      Future Appointments  Date Time Provider Department Center  02/16/2024  8:00 PM Shlomo Wilbert SAUNDERS, MD [facility]  02/20/2024  8:45 AM Inocencio Soyla Lunger, MD [facility]  04/14/2024  7:05 AM [facility] DEVICE REMOTES [facility]  07/14/2024  7:05 AM [facility] DEVICE REMOTES [facility]  10/13/2024  7:05 AM [facility] DEVICE REMOTES [facility]  01/12/2025  7:05 AM [facility] DEVICE REMOTES [facility]  04/13/2025  7:05 AM [facility] DEVICE REMOTES [facility]  07/13/2025  7:05 AM [facility] DEVICE REMOTES [facility]

## 2024-02-14 DIAGNOSIS — R262 Difficulty in walking, not elsewhere classified: Secondary | ICD-10-CM | POA: Diagnosis not present

## 2024-02-16 ENCOUNTER — Ambulatory Visit (HOSPITAL_BASED_OUTPATIENT_CLINIC_OR_DEPARTMENT_OTHER): Attending: Cardiology | Admitting: Cardiology

## 2024-02-16 DIAGNOSIS — G4733 Obstructive sleep apnea (adult) (pediatric): Secondary | ICD-10-CM | POA: Diagnosis not present

## 2024-02-16 DIAGNOSIS — I110 Hypertensive heart disease with heart failure: Secondary | ICD-10-CM | POA: Insufficient documentation

## 2024-02-16 DIAGNOSIS — R000 Tachycardia, unspecified: Secondary | ICD-10-CM | POA: Insufficient documentation

## 2024-02-16 DIAGNOSIS — I493 Ventricular premature depolarization: Secondary | ICD-10-CM | POA: Insufficient documentation

## 2024-02-16 DIAGNOSIS — I2510 Atherosclerotic heart disease of native coronary artery without angina pectoris: Secondary | ICD-10-CM | POA: Insufficient documentation

## 2024-02-16 DIAGNOSIS — I480 Paroxysmal atrial fibrillation: Secondary | ICD-10-CM | POA: Insufficient documentation

## 2024-02-16 DIAGNOSIS — I5022 Chronic systolic (congestive) heart failure: Secondary | ICD-10-CM | POA: Diagnosis not present

## 2024-02-16 DIAGNOSIS — I10 Essential (primary) hypertension: Secondary | ICD-10-CM

## 2024-02-17 NOTE — Progress Notes (Unsigned)
  Electrophysiology Office Note:   Date:  02/17/2024  ID:  Danielle [name], DOB Jun 30, 1945, MRN [id number]  Primary Cardiologist: Toribio Fuel, MD Primary Heart Failure: None Electrophysiologist: Evian Derringer Gladis Norton, MD *** {Click to update primary MD,subspecialty MD or APP then REFRESH:1}    History of Present Illness:   Danielle [name] is a 78 y.o. female with h/o chronic systolic heart failure, coronary artery disease, symptomatic bradycardia post pacemaker, hypertension, diabetes, hypothyroidism, sleep apnea, chronic systolic heart failure due to ischemic cardiomyopathy seen today for routine electrophysiology followup.   Since last being seen in our clinic the patient reports doing ***.  she denies chest pain, palpitations, dyspnea, PND, orthopnea, nausea, vomiting, dizziness, syncope, edema, weight gain, or early satiety.   Review of systems complete and found to be negative unless listed in HPI.      EP Information / Studies Reviewed:    {EKGtoday:28818}      PPM Interrogation-  reviewed in detail today,  See PACEART report.  Device History: Medtronic Dual Chamber PPM implanted 2016 for Sinus Node Dysfunction  Risk Assessment/Calculations:    CHA2DS2-VASc Score = 7  {Confirm score is correct.  If not, click here to update score.  REFRESH note.  :1} This indicates a 11.2% annual risk of stroke. The patient's score is based upon: CHF History: 1 HTN History: 1 Diabetes History: 1 Stroke History: 0 Vascular Disease History: 1 Age Score: 2 Gender Score: 1   {This patient has a significant risk of stroke if diagnosed with atrial fibrillation.  Please consider VKA or DOAC agent for anticoagulation if the bleeding risk is acceptable.   You can also use the SmartPhrase .[email or web address] for documentation.   :[id number]}   STOP-Bang Score:  6  { Consider Dx Sleep Disordered Breathing or Sleep Apnea  ICD G47.33          :1}     Physical Exam:   VS:  There were no vitals taken  for this visit.   Wt Readings from Last 3 Encounters:  02/16/24 204 lb (92.5 kg)  02/13/24 203 lb (92.1 kg)  02/04/24 194 lb (88 kg)     GEN: Well nourished, well developed in no acute distress NECK: No JVD; No carotid bruits CARDIAC: {EPRHYTHM:28826}, no murmurs, rubs, gallops RESPIRATORY:  Clear to auscultation without rales, wheezing or rhonchi  ABDOMEN: Soft, non-tender, non-distended EXTREMITIES:  No edema; No deformity   ASSESSMENT AND PLAN:    SND s/p Medtronic PPM  Normal PPM function See Pace Art report No changes today  2.  Paroxysmal atrial fibrillation: Minimal burden on device interrogation.  3.  Secondary hypercoagulable state: Continue Eliquis   4.  Chronic systolic heart failure: On medical therapy per primary cardiology.  5.  Obstructive sleep apnea: CPAP compliance encouraged  6.  Hypertension:*** Disposition:   Follow up with {EPPROVIDERS:28135::EP Team} {EPFOLLOW LE:71826}  Signed, Sarabi Sockwell Gladis Norton, MD

## 2024-02-17 NOTE — Procedures (Signed)
  Indications for Polysomnography The patient is a 78 year old Female who is 5' 4 and weighs 204.0 lbs. Her BMI equals 35.3.  A full night titration treatment study was performed.  Medication were taken at 9:00pmFish OilBasaglar KwikpenNeurontinCoregLipitorEliquis Polysomnogram Data A full night polysomnogram recorded the standard physiologic parameters including EEG, EOG, EMG, EKG, nasal and oral airflow.  Respiratory parameters of chest and abdominal movements were recorded with Respiratory Inductance Plethysmography belts.   Oxygen saturation was recorded by pulse oximetry.  Sleep Architecture The total recording time of the polysomnogram was 386.4 minutes.  The total sleep time was 249.5 minutes.  The patient spent 1.2% of total sleep time in Stage N1, 28.7% in Stage N2, 53.9% in Stages N3, and 16.2% in REM.  Sleep latency was 58.6 minutes.   REM latency was 98.5 minutes.  Sleep Efficiency was 64.6%.  Wake after Sleep Onset time was 78.0 minutes.  Titration Summary The patient was titrated at pressures ranging from 8 cm/H20 up to 12 cm/H20.  The last pressure used in the study was 12 cm/H20  Respiratory Events The polysomnogram revealed a presence of 0 obstructive, 0 central, and 0 mixed apneas resulting in an Apnea index of 0 events per hour.  There were 55 hypopneas (GreaterEqual to3% desaturation and/or arousal) resulting in an Apnea\Hypopnea Index (AHI  GreaterEqual to3% desaturation and/or arousal) of 13.2 events per hour.  There were 34 hypopneas (GreaterEqual to4% desaturation) resulting in an Apnea\Hypopnea Index (AHI GreaterEqual to4% desaturation) of 8.2 events per hour.  There were 1 Respiratory  Effort Related Arousals resulting in a RERA index of 0.2 events per hour. The Respiratory Disturbance Index is 13.5 events per hour.  The snore index was 0 events per hour.  Mean oxygen saturation was 92.8%.  The lowest oxygen saturation during sleep was 86.0%.  Time spent LessEqual to88%  oxygen saturation was  minutes ().  Limb Activity There were 0 limb movements recorded.  Cardiac Summary The average pulse rate was 66.5 bpm.  The minimum pulse rate was 60.0 bpm while the maximum pulse rate was 83.0 bpm.  Cardiac rhythm was NSR with rare PVCs and a 5 beat run of wide complex tachycardia.   Diagnosis:  Obstructive Sleep APnea PVCs/Wide complex tachycardia (5 beats)  Recommendations: 1.  Recommend a trial of ResMed CPAP at 12cm H2O with small Airfit F20 mask and heated humidity. 2. Close follow-up is necessary to ensure success with CPAP or oral appliance therapy for maximum benefit. 3. A follow-up oximetry study on CPAP is recommended to assess the adequacy of therapy and determine the need for supplemental oxygen or the potential need for Bi-level therapy.  An arterial blood gas to determine the adequacy of baseline ventilation and  oxygenation should also be considered. 4. Healthy sleep recommendations include:  adequate nightly sleep (normal 7-9 hrs/night), avoidance of caffeine after noon and alcohol near bedtime, and maintaining a sleep environment that is cool, dark and quiet. 5. Weight loss for overweight patients is recommended.  Even modest amounts of weight loss can significantly improve the severity of sleep apnea. 6. Snoring recommendations include:  weight loss where appropriate, side sleeping, and avoidance of alcohol before bed. 7. Operation of motor vehicle should be avoided when sleepy.    This study was personally reviewed and electronically signed by: Shlomo Corning, MD Accredited Board Certified in Sleep Medicine Date/Time: 02/17/2024 4:40PM

## 2024-02-17 NOTE — Addendum Note (Signed)
 Addended by: SHLOMO WILBERT SAUNDERS on: 02/17/2024 04:46 PM   Modules accepted: Orders

## 2024-02-19 ENCOUNTER — Other Ambulatory Visit (HOSPITAL_COMMUNITY): Payer: Self-pay

## 2024-02-19 NOTE — Progress Notes (Signed)
 Paramedicine Encounter    Patient ID: Danielle [name], female    DOB: 22-Jun-1945, 78 y.o.   MRN: [id number]   Complaints- none   Assessment- CAOX4, warm and dry, vitals obtained and as noted. Lungs clear. CBG- 212  No lower leg edema.   Compliance with meds- no missed doses   Pill box filled- for one week   Refills needed- eliquis , atorvastatin , allopurinol    Meds changes since last visit- none     Social changes- none    VISIT SUMMARY- Arrived for home visit for [facility] who reports to be feeling well with no complaints today. Assessment and vitals obtained and within normal. No lower leg edema more than her baseline. Lungs clear. I reviewed meds and filled pill box for one week. We reviewed upcoming visits and reviewed HF education and DM education. Dexcom will need to be replaced on Monday I will come do same. She agreed. Home visit complete.   BP 122/78   Pulse 78   Resp 16   Wt 200 lb (90.7 kg)   SpO2 98%   BMI 34.33 kg/m  Weight yesterday-- didn't weigh Last visit weight-- 203lbs     ACTION: Home visit completed     Patient Care Team: Rexanne Ingle, MD as PCP - General (Internal Medicine) Inocencio Soyla Lunger, MD as PCP - Electrophysiology (Cardiology) Shlomo Wilbert SAUNDERS, MD as PCP - Sleep Medicine (Cardiology) Bensimhon, Toribio SAUNDERS, MD as PCP - Cardiology (Cardiology) Cathern Andriette DEL, LCSW as Social Worker (Licensed Clinical Social Worker) Shamleffer, Donell Cardinal, MD as Consulting Physician (Endocrinology)  Patient Active Problem List   Diagnosis Date Noted   Chronic systolic heart failure (HCC) 05/08/2022   Type 2 diabetes mellitus with stage 3a chronic kidney disease, with long-term current use of insulin  (HCC) 02/16/2022   Prosthetic joint implant failure, initial encounter 12/20/2021   Pseudophakia of both eyes 11/15/2021   DVT (deep venous thrombosis) (HCC) 07/31/2021   Acute blood loss anemia    Septic Arthritis, Infection of prosthetic right knee  joint (HCC), MSSA infection 07/10/2021   Complication of internal right knee prosthesis 07/10/2021   CAD (coronary artery disease) 06/08/2021   Carotid artery disease 06/08/2021   Symptomatic bradycardia 06/08/2021   Paroxysmal atrial fibrillation (HCC) 06/08/2021   CKD (chronic kidney disease), stage III (HCC) 06/08/2021   Polyneuropathy associated with underlying disease 05/04/2020   Severe nonproliferative diabetic retinopathy of left eye, with macular edema, associated with type 2 diabetes mellitus (HCC) 11/05/2019   Severe nonproliferative diabetic retinopathy of right eye, with macular edema, associated with type 2 diabetes mellitus (HCC) 11/05/2019   Retinal hemorrhage of right eye 11/05/2019   Retinal hemorrhage of left eye 11/05/2019   Diabetes mellitus (HCC) 05/14/2019   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, with long-term current use of insulin  (HCC) 05/14/2019   Type 2 diabetes mellitus with diabetic polyneuropathy, with long-term current use of insulin  (HCC) 05/14/2019   History of gout 05/20/2018   Primary localized osteoarthritis of right knee 05/13/2018   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, Disp: 180 tablet, Rfl: 3   atorvastatin  (LIPITOR ) 80 MG tablet, Take 1 tablet (80 mg total) by mouth at bedtime., Disp: 100 tablet, Rfl: 3   B Complex-Biotin -FA (B-COMPLEX  PO), Take 1 tablet by mouth daily., Disp: , Rfl:    Biotin  10 MG TABS, Take 10 mg by mouth daily., Disp: , Rfl:    Blood Glucose Monitoring Suppl (ONETOUCH VERIO) w/Device KIT, Use to check blood sugar, Disp: 1 kit, Rfl: 0   carvedilol  (COREG ) 3.125 MG tablet, Take 1 tablet (3.125 mg total) by mouth 2 (two) times daily with a meal., Disp: 180 tablet, Rfl: 3   cetirizine (ZYRTEC) 10 MG tablet, Take 10 mg by mouth daily., Disp: , Rfl:    Cinnamon 500 MG capsule, Take 2,000 mg by mouth daily., Disp: , Rfl:    clotrimazole-betamethasone (LOTRISONE) lotion, Apply 1 application topically 2 (two) times daily as needed (itching)., Disp: , Rfl:    Continuous Glucose Sensor (DEXCOM G6 SENSOR) MISC, 1 Device by Does not apply route as directed., Disp: 9 each, Rfl: 3   Continuous Glucose Transmitter (DEXCOM G6 TRANSMITTER) MISC, Change every 90 days, Disp: 1 each, Rfl: 3   empagliflozin  (JARDIANCE ) 10 MG TABS tablet, Take 1 tablet (10 mg total) by mouth daily., Disp: 90 tablet, Rfl: 3   ferrous sulfate  325 (65 FE) MG tablet, Take 1 tablet (325 mg total) by mouth 3 (three) times a week., Disp: 45 tablet, Rfl: 3   folic acid  (FOLVITE ) 1 MG tablet, Take 1 tablet (1 mg total) by mouth daily., Disp: 90 tablet, Rfl: 3   gabapentin  (NEURONTIN ) 300 MG capsule, Take 2 capsules (600 mg total) by mouth 2 (two) times daily., Disp: 360 capsule, Rfl: 3   glucose blood (ONETOUCH VERIO) test strip, Check blood sugar 2 times daily, Disp: 100 each, Rfl: 12   insulin  aspart (NOVOLOG  FLEXPEN) 100 UNIT/ML FlexPen, DIAL  AND INJECT UNDER THE SKIN THREE TIMES DAILY PER CORRECTION SCALE. MAX DAILY DOSE 50 UNITS, Disp: 15 mL, Rfl: 0   Insulin  Glargine (BASAGLAR  KWIKPEN) 100 UNIT/ML, Inject 40 Units into the skin at bedtime., Disp: 45 mL, Rfl: 2   Insulin  Pen Needle 32G X 4 MM MISC, 1 Device by Does not apply route in the morning, at noon, in the evening, and at bedtime., Disp: 400 each, Rfl: 3   Lancets (ONETOUCH DELICA PLUS LANCET33G)  MISC, USE   TO CHECK GLUCOSE 4 TIMES DAILY, Disp: 100 each, Rfl: 3   latanoprost  (XALATAN ) 0.005 % ophthalmic solution, Place 1 drop into both eyes at bedtime., Disp: , Rfl:    levothyroxine  (SYNTHROID , LEVOTHROID) 75 MCG tablet, Take 75 mcg by mouth daily before breakfast., Disp: , Rfl:    Multiple Vitamins-Minerals (ALIVE ONCE DAILY WOMENS 50+ PO), Take 1 tablet by mouth daily., Disp: , Rfl:    Omega-3 Fatty Acids (FISH OIL) 1200 MG CAPS, Take 1,200 mg by mouth at bedtime. Taking 2 capsules at night, Disp: , Rfl:    spironolactone  (ALDACTONE ) 25 MG tablet, Take 1 tablet (25 mg total) by mouth daily., Disp: 90 tablet, Rfl: 3   torsemide  (DEMADEX ) 20 MG tablet, Take 3 tablets (60 mg total) by mouth daily., Disp: 90 tablet, Rfl: 3   gabapentin  (NEURONTIN ) 300  MG capsule, Take 1 capsule (300 mg total) by mouth 2 (two) times daily., Disp: 60 capsule, Rfl: 2   insulin  lispro (HUMALOG  KWIKPEN) 100 UNIT/ML KwikPen, Take HUmalog  8 units with each meal PLUS Humalog  correctional insulin  scale max daily dose 50 units (Patient not taking: Reported on 02/10/2024), Disp: 30 mL, Rfl: 3   Insulin  Lispro (HUMALOG  [place]), Inject 8 Units into the skin in the morning, at noon, and at bedtime. (Patient not taking: Reported on 02/10/2024), Disp: , Rfl:    LUMIGAN 0.01 % SOLN, Place 1 drop into both eyes at bedtime. (Patient not taking: Reported on 02/10/2024), Disp: , Rfl:    ondansetron  (ZOFRAN ) 4 MG tablet, Take 1 tablet (4 mg total) by mouth every 8 (eight) hours as needed for nausea or vomiting. (Patient not taking: Reported on 02/10/2024), Disp: 20 tablet, Rfl: 0   Travoprost, BAK Free, (TRAVATAN) 0.004 % SOLN ophthalmic solution, Place 1 drop into both eyes at bedtime. (Patient not taking: Reported on 02/10/2024), Disp: , Rfl:  Allergies  Allergen Reactions   Codeine Other (See Comments)   Other Other (See Comments)    Blood Product Refusal (Jehovah's witness)     Tape Other (See Comments)    Leaves blisters and marks  on the skin   Sulfa Antibiotics Rash   Uloric  [Febuxostat ] Rash     Social History   Socioeconomic History   Marital status: Widowed    Spouse name: Not on file   Number of children: 3   Years of education: Not on file   Highest education level: Some college, no degree  Occupational History   Occupation: retired  Tobacco Use   Smoking status: Former    Current packs/day: 0.00    Average packs/day: 1 pack/day for 22.0 years (22.0 ttl pk-yrs)    Types: Cigarettes    Start date: 12    Quit date: 1983    Years since quitting: 42.7   Smokeless tobacco: Never  Vaping Use   Vaping status: Never Used  Substance and Sexual Activity   Alcohol use: Not Currently    Comment: 05/22/2018 glass of wine couple times/wk   Drug use: Not Currently   Sexual activity: Not Currently    Birth control/protection: None, Post-menopausal  Other Topics Concern   Not on file  Social History Narrative   Not on file   Social Drivers of Health   Financial Resource Strain: Medium Risk (12/22/2021)   Overall Financial Resource Strain (CARDIA)    Difficulty of Paying Living Expenses: Somewhat hard  Food Insecurity: No Food Insecurity (05/08/2018)   Hunger Vital Sign    Worried About Running Out of Food in the Last Year: Never true    Ran Out of Food in the Last Year: Never true  Transportation Needs: Unmet Transportation Needs (05/08/2022)   PRAPARE - Administrator, Civil Service (Medical): Yes    Lack of Transportation (Non-Medical): Yes  Physical Activity: Not on file  Stress: Not on file  Social Connections: Not on file  Intimate Partner Violence: Not on file    Physical Exam      Future Appointments  Date Time Provider Department Center  02/20/2024  8:45 AM Inocencio Soyla Lunger, MD [facility]  04/14/2024  7:05 AM [facility] DEVICE REMOTES [facility]  07/14/2024  7:05 AM [facility] DEVICE REMOTES [facility]  10/13/2024  7:05 AM [facility] DEVICE REMOTES [facility]   01/12/2025  7:05 AM [facility] DEVICE REMOTES [facility]  04/13/2025  7:05 AM [facility] DEVICE REMOTES [facility]  07/13/2025  7:05 AM [facility] DEVICE REMOTES [facility]

## 2024-02-19 NOTE — Progress Notes (Signed)
 Remote PPM Transmission

## 2024-02-20 ENCOUNTER — Telehealth: Payer: Self-pay | Admitting: *Deleted

## 2024-02-20 ENCOUNTER — Encounter: Payer: Self-pay | Admitting: Cardiology

## 2024-02-20 ENCOUNTER — Ambulatory Visit: Attending: Cardiology | Admitting: Cardiology

## 2024-02-20 VITALS — BP 130/80 | HR 74 | Ht 64.0 in | Wt 208.0 lb

## 2024-02-20 DIAGNOSIS — I5022 Chronic systolic (congestive) heart failure: Secondary | ICD-10-CM

## 2024-02-20 DIAGNOSIS — G4733 Obstructive sleep apnea (adult) (pediatric): Secondary | ICD-10-CM | POA: Diagnosis not present

## 2024-02-20 DIAGNOSIS — I495 Sick sinus syndrome: Secondary | ICD-10-CM

## 2024-02-20 DIAGNOSIS — D6869 Other thrombophilia: Secondary | ICD-10-CM

## 2024-02-20 DIAGNOSIS — I10 Essential (primary) hypertension: Secondary | ICD-10-CM

## 2024-02-20 DIAGNOSIS — I480 Paroxysmal atrial fibrillation: Secondary | ICD-10-CM | POA: Diagnosis not present

## 2024-02-20 LAB — CUP PACEART INCLINIC DEVICE CHECK
Date Time Interrogation Session: 20250925090901
Implantable Lead Connection Status: 753985
Implantable Lead Connection Status: 753985
Implantable Lead Implant Date: 20160222
Implantable Lead Implant Date: 20160222
Implantable Lead Location: 753859
Implantable Lead Location: 753860
Implantable Lead Model: 5076
Implantable Lead Model: 5076
Implantable Pulse Generator Implant Date: 20160222
Lead Channel Impedance Value: 437 Ohm
Lead Channel Impedance Value: 551 Ohm
Lead Channel Pacing Threshold Amplitude: 0.75 V
Lead Channel Pacing Threshold Amplitude: 0.75 V
Lead Channel Pacing Threshold Pulse Width: 0.4 ms
Lead Channel Pacing Threshold Pulse Width: 0.4 ms
Lead Channel Sensing Intrinsic Amplitude: 20 mV
Lead Channel Sensing Intrinsic Amplitude: 4.6 mV

## 2024-02-20 NOTE — Patient Instructions (Signed)
 Medication Instructions:  Your physician recommends that you continue on your current medications as directed. Please refer to the Current Medication list given to you today.  *If you need a refill on your cardiac medications before your next appointment, please call your pharmacy*  Follow-Up: At [facility], you and your health needs are our priority.  As part of our continuing mission to provide you with exceptional heart care, our providers are all part of one team.  This team includes your primary Cardiologist (physician) and Advanced Practice Providers or APPs (Physician Assistants and Nurse Practitioners) who all work together to provide you with the care you need, when you need it.  Your next appointment:   1 year  Provider:   You may see one of the following Advanced Practice Providers on your designated Care Team:   Charlies Arthur, [place] Ozell Jodie Passey, PA-C Suzann Riddle, NP Daphne Barrack, NP Artist Pouch, PA-C

## 2024-02-20 NOTE — Telephone Encounter (Signed)
 The patient has been notified of the result and verbalized understanding.  All questions (if any) were answered. Joshua Dalton Seip, CMA 02/20/2024 12:23 PM     Upon patient request DME selection is [facility] Patient understands he will be contacted by [facility] to set up his cpap. Patient understands to call if [facility] does not contact him with new setup in a timely manner. Patient understands they will be called once confirmation has been received from [facility] that they have received their new machine to schedule 10 week follow up appointment.   [facility] notified of new cpap order  Please add to airview Patient was grateful for the call and thanked me.

## 2024-02-20 NOTE — Telephone Encounter (Signed)
-----   Message from Wilbert Bihari sent at 02/17/2024  4:42 PM EDT ----- Please let patient know that they had a successful PAP titration and let DME know that orders are in EPIC.  Please set up 6 week OV with me.

## 2024-02-25 DIAGNOSIS — H401133 Primary open-angle glaucoma, bilateral, severe stage: Secondary | ICD-10-CM | POA: Diagnosis not present

## 2024-02-25 DIAGNOSIS — Z961 Presence of intraocular lens: Secondary | ICD-10-CM | POA: Diagnosis not present

## 2024-02-27 ENCOUNTER — Other Ambulatory Visit (HOSPITAL_COMMUNITY): Payer: Self-pay

## 2024-02-27 NOTE — Telephone Encounter (Signed)
 Spoke to [place] at [facility] today at 11:53 and she states they are waiting for office notes- she did not specify what was needed. I will pass this information back to Brad Molt.   Powell Mirza, EMT-Paramedic [phone] 02/27/2024

## 2024-02-27 NOTE — Progress Notes (Signed)
 Paramedicine Encounter    Patient ID: Danielle [name], female    DOB: 1945/07/19, 78 y.o.   MRN: [id number]   Complaints- right leg swelling   Assessment- CAOX4, warm and dry ambulating in her apartment without shortness of breath, no chest pain, no dizziness, lungs clear, vitals within normal limits. Weight down 2 lbs.   Compliance with meds- no missed doses   Pill box filled- for one week   Refills needed- torsemide    Meds changes since last visit- none     Social changes- none    VISIT SUMMARY- Arrived for home visit for [facility] who reports to be feeling okay with some lower right leg swelling. Lungs clear, weight down 2 lbs. Vitals within normal limits. Reports to be feeling good. Meds reviewed and pill box filled for one week. Appointments reviewed. HF education and DM education provided. She has called endocrinology about fluctuating blood sugars. Home visit complete.   CBG- 171   BP 108/64   Pulse 68   Resp 16   Wt 206 lb 6.4 oz (93.6 kg)   SpO2 96%   BMI 35.43 kg/m  Weight yesterday-- didn't weigh  Last visit weight-- 208lbs      ACTION: Home visit completed     Patient Care Team: Rexanne Ingle, MD as PCP - General (Internal Medicine) Inocencio Soyla Lunger, MD as PCP - Electrophysiology (Cardiology) Shlomo Wilbert SAUNDERS, MD as PCP - Sleep Medicine (Cardiology) Bensimhon, Toribio SAUNDERS, MD as PCP - Cardiology (Cardiology) Cathern Andriette DEL, LCSW as Social Worker (Licensed Clinical Social Worker) Shamleffer, Donell Cardinal, MD as Consulting Physician (Endocrinology)  Patient Active Problem List   Diagnosis Date Noted   Chronic systolic heart failure (HCC) 05/08/2022   Type 2 diabetes mellitus with stage 3a chronic kidney disease, with long-term current use of insulin  (HCC) 02/16/2022   Prosthetic joint implant failure, initial encounter 12/20/2021   Pseudophakia of both eyes 11/15/2021   DVT (deep venous thrombosis) (HCC) 07/31/2021   Acute blood loss anemia     Septic Arthritis, Infection of prosthetic right knee joint (HCC), MSSA infection 07/10/2021   Complication of internal right knee prosthesis 07/10/2021   CAD (coronary artery disease) 06/08/2021   Carotid artery disease 06/08/2021   Symptomatic bradycardia 06/08/2021   Paroxysmal atrial fibrillation (HCC) 06/08/2021   CKD (chronic kidney disease), stage III (HCC) 06/08/2021   Polyneuropathy associated with underlying disease 05/04/2020   Severe nonproliferative diabetic retinopathy of left eye, with macular edema, associated with type 2 diabetes mellitus (HCC) 11/05/2019   Severe nonproliferative diabetic retinopathy of right eye, with macular edema, associated with type 2 diabetes mellitus (HCC) 11/05/2019   Retinal hemorrhage of right eye 11/05/2019   Retinal hemorrhage of left eye 11/05/2019   Diabetes mellitus (HCC) 05/14/2019   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, with long-term current use of insulin  (HCC) 05/14/2019   Type 2 diabetes mellitus with diabetic polyneuropathy, with long-term current use of insulin  (HCC) 05/14/2019   History of gout 05/20/2018   Primary localized osteoarthritis of right knee 05/13/2018   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, Disp: 180 tablet, Rfl: 3   atorvastatin  (LIPITOR ) 80 MG tablet, Take 1 tablet (80 mg total) by mouth at bedtime., Disp:  100 tablet, Rfl: 3   B Complex-Biotin -FA (B-COMPLEX PO), Take 1 tablet by mouth daily., Disp: , Rfl:    Biotin  10 MG TABS, Take 10 mg by mouth daily., Disp: , Rfl:    Blood Glucose Monitoring Suppl (ONETOUCH VERIO) w/Device KIT, Use to check blood sugar, Disp: 1 kit, Rfl: 0   carvedilol  (COREG ) 3.125 MG tablet, Take 1 tablet (3.125 mg total) by mouth 2 (two) times daily with a meal., Disp: 180 tablet, Rfl: 3   cetirizine (ZYRTEC) 10 MG tablet, Take 10 mg by mouth daily., Disp: , Rfl:    Cinnamon 500 MG capsule, Take 2,000 mg by mouth daily., Disp: , Rfl:    clotrimazole-betamethasone (LOTRISONE) lotion, Apply 1 application topically 2 (two) times daily as needed (itching)., Disp: , Rfl:    Continuous Glucose Sensor (DEXCOM G6 SENSOR) MISC, 1 Device by Does not apply route as directed., Disp: 9 each, Rfl: 3   Continuous Glucose Transmitter (DEXCOM G6 TRANSMITTER) MISC, Change every 90 days, Disp: 1 each, Rfl: 3   empagliflozin  (JARDIANCE ) 10 MG TABS tablet, Take 1 tablet (10 mg total) by mouth daily., Disp: 90 tablet, Rfl: 3   ferrous sulfate  325 (65 FE) MG tablet, Take 1 tablet (325 mg total) by mouth 3 (three) times a week., Disp: 45 tablet, Rfl: 3   folic acid  (FOLVITE ) 1 MG tablet, Take 1 tablet (1 mg total) by mouth daily., Disp: 90 tablet, Rfl: 3   gabapentin  (NEURONTIN ) 300 MG capsule, Take 2 capsules (600 mg total) by mouth 2 (two) times daily., Disp: 360 capsule, Rfl: 3   glucose blood (ONETOUCH VERIO) test strip, Check blood sugar 2 times daily, Disp: 100 each, Rfl: 12   insulin  aspart (NOVOLOG  FLEXPEN) 100 UNIT/ML FlexPen, DIAL  AND INJECT UNDER THE SKIN THREE TIMES DAILY PER CORRECTION SCALE. MAX DAILY DOSE 50 UNITS, Disp: 15 mL, Rfl: 0   Insulin  Glargine (BASAGLAR  KWIKPEN) 100 UNIT/ML, Inject 40 Units into the skin at bedtime., Disp: 45 mL, Rfl: 2   Insulin  Pen Needle 32G X 4 MM MISC, 1 Device by Does not apply route in the morning, at noon, in the evening, and at bedtime., Disp: 400  each, Rfl: 3   Lancets (ONETOUCH DELICA PLUS LANCET33G) MISC, USE   TO CHECK GLUCOSE 4 TIMES DAILY, Disp: 100 each, Rfl: 3   latanoprost  (XALATAN ) 0.005 % ophthalmic solution, Place 1 drop into both eyes at bedtime., Disp: , Rfl:    levothyroxine  (SYNTHROID , LEVOTHROID) 75 MCG tablet, Take 75 mcg by mouth daily before breakfast., Disp: , Rfl:    Multiple Vitamins-Minerals (ALIVE ONCE DAILY WOMENS 50+ PO), Take 1 tablet by mouth daily., Disp: , Rfl:    Omega-3 Fatty Acids (FISH OIL) 1200 MG CAPS, Take 1,200 mg by mouth at bedtime. Taking 2 capsules at night, Disp: , Rfl:    spironolactone  (ALDACTONE ) 25 MG tablet, Take 1 tablet (25 mg total) by mouth daily., Disp: 90 tablet, Rfl: 3   torsemide  (DEMADEX ) 20 MG tablet, Take 3 tablets (60 mg total) by mouth daily., Disp: 90 tablet, Rfl:  3   gabapentin  (NEURONTIN ) 300 MG capsule, Take 1 capsule (300 mg total) by mouth 2 (two) times daily. (Patient not taking: Reported on 02/27/2024), Disp: 60 capsule, Rfl: 2   LUMIGAN 0.01 % SOLN, Place 1 drop into both eyes at bedtime. (Patient not taking: Reported on 02/27/2024), Disp: , Rfl:    ondansetron  (ZOFRAN ) 4 MG tablet, Take 1 tablet (4 mg total) by mouth every 8 (eight) hours as needed for nausea or vomiting. (Patient not taking: Reported on 02/27/2024), Disp: 20 tablet, Rfl: 0   Travoprost, BAK Free, (TRAVATAN) 0.004 % SOLN ophthalmic solution, Place 1 drop into both eyes at bedtime. (Patient not taking: Reported on 02/27/2024), Disp: , Rfl:  Allergies  Allergen Reactions   Codeine Other (See Comments)   Other Other (See Comments)    Blood Product Refusal (Jehovah's witness)     Tape Other (See Comments)    Leaves blisters and marks on the skin   Sulfa Antibiotics Rash   Uloric  [Febuxostat ] Rash     Social History   Socioeconomic History   Marital status: Widowed    Spouse name: Not on file   Number of children: 3   Years of education: Not on file   Highest education level: Some college, no  degree  Occupational History   Occupation: retired  Tobacco Use   Smoking status: Former    Current packs/day: 0.00    Average packs/day: 1 pack/day for 22.0 years (22.0 ttl pk-yrs)    Types: Cigarettes    Start date: 30    Quit date: 1983    Years since quitting: 42.7   Smokeless tobacco: Never  Vaping Use   Vaping status: Never Used  Substance and Sexual Activity   Alcohol use: Not Currently    Comment: 05/22/2018 glass of wine couple times/wk   Drug use: Not Currently   Sexual activity: Not Currently    Birth control/protection: None, Post-menopausal  Other Topics Concern   Not on file  Social History Narrative   Not on file   Social Drivers of Health   Financial Resource Strain: Medium Risk (12/22/2021)   Overall Financial Resource Strain (CARDIA)    Difficulty of Paying Living Expenses: Somewhat hard  Food Insecurity: No Food Insecurity (05/08/2018)   Hunger Vital Sign    Worried About Running Out of Food in the Last Year: Never true    Ran Out of Food in the Last Year: Never true  Transportation Needs: Unmet Transportation Needs (05/08/2022)   PRAPARE - Administrator, Civil Service (Medical): Yes    Lack of Transportation (Non-Medical): Yes  Physical Activity: Not on file  Stress: Not on file  Social Connections: Not on file  Intimate Partner Violence: Not on file    Physical Exam      Future Appointments  Date Time Provider Department Center  04/14/2024  7:05 AM [facility] HVT DEVICE REMOTES [facility]  07/14/2024  7:05 AM [facility] DEVICE REMOTES [facility]  10/13/2024  7:05 AM [facility]VT DEVICE REMOTES [facility]  01/12/2025  7:05 AM [facility] DEVICE REMOTES [facility]  04/13/2025  7:05 AM [facility] HVT DEVICE REMOTES [facility]  07/13/2025  7:05 AM [facility] HVT DEVICE REMOTES [facility]

## 2024-03-03 NOTE — Telephone Encounter (Signed)
 Spoke to [place] at [facility] and she says she will pull the notes where the titration was denied to send to the insurance to get the patient approved because that is what caused the delay for the patient and why the patient has not had another office visit since her sleep study.

## 2024-03-05 ENCOUNTER — Other Ambulatory Visit (HOSPITAL_COMMUNITY): Payer: Self-pay

## 2024-03-05 DIAGNOSIS — I10 Essential (primary) hypertension: Secondary | ICD-10-CM | POA: Diagnosis not present

## 2024-03-05 NOTE — Telephone Encounter (Signed)
 [facility] called today and said everything is approved and they scheduled her for fitting and setup in their office. This is scheduled for today at 2:00.    Set up $82.50 Monthly copay [address]   Powell Mirza, EMT-Paramedic [phone] 03/05/2024

## 2024-03-05 NOTE — Progress Notes (Signed)
 Paramedicine Encounter    Patient ID: Danielle [name], female    DOB: [birth year], 78 y.o.   MRN: [id number]   Complaints- not sleeping well   Assessment- CAOX4, warm and dry seated in her living room reporting to be feeling okay but having trouble sleeping, some bilateral lower leg swelling, no shortness of breath, no dizziness, no chest pain, lungs clear. Vitals within normal limits.   Compliance with meds- one missed dose of morning meds   Pill box filled- for two weeks   Refills needed- spironolactone    Meds changes since last visit- none     Social changes- Will be getting CPAP today at [facility] at 2:00.    VISIT SUMMARY- Arrived for home visit for [name] who reports to be feeling okay but expresses that she has not been sleeping well. She has been without her CPAP for several months, She will be getting this today from [facility]. I obtained vitals and assessment. I educated on HF compliance and Diabetic Management. Dexcom application education provided. Meds reviewed and confirmed, pill box filled for two weeks. We reviewed upcoming appointments and confirmed same. Home visit complete. I will see her in two weeks. She agreed and knows to reach out to me and or clinic if any needs arise.   BP 118/68   Pulse 67   Resp 16   Wt 206 lb (93.4 kg)   SpO2 96%   BMI 35.36 kg/m  Weight yesterday-- didn't weigh  Last visit weight-- 206lbs     ACTION: Home visit completed     Patient Care Team: Rexanne Ingle, MD as PCP - General (Internal Medicine) Inocencio Soyla Lunger, MD as PCP - Electrophysiology (Cardiology) Shlomo Wilbert SAUNDERS, MD as PCP - Sleep Medicine (Cardiology) Bensimhon, Toribio SAUNDERS, MD as PCP - Cardiology (Cardiology) Cathern Andriette DEL, LCSW as Social Worker (Licensed Clinical Social Worker) Shamleffer, Donell Cardinal, MD as Consulting Physician (Endocrinology)  Patient Active Problem List   Diagnosis Date Noted   Chronic systolic heart failure (HCC) 05/08/2022   Type 2  diabetes mellitus with stage 3a chronic kidney disease, with long-term current use of insulin  (HCC) 02/16/2022   Prosthetic joint implant failure, initial encounter 12/20/2021   Pseudophakia of both eyes 11/15/2021   DVT (deep venous thrombosis) (HCC) 07/31/2021   Acute blood loss anemia    Septic Arthritis, Infection of prosthetic right knee joint (HCC), MSSA infection 07/10/2021   Complication of internal right knee prosthesis 07/10/2021   CAD (coronary artery disease) 06/08/2021   Carotid artery disease 06/08/2021   Symptomatic bradycardia 06/08/2021   Paroxysmal atrial fibrillation (HCC) 06/08/2021   CKD (chronic kidney disease), stage III (HCC) 06/08/2021   Polyneuropathy associated with underlying disease 05/04/2020   Severe nonproliferative diabetic retinopathy of left eye, with macular edema, associated with type 2 diabetes mellitus (HCC) 11/05/2019   Severe nonproliferative diabetic retinopathy of right eye, with macular edema, associated with type 2 diabetes mellitus (HCC) 11/05/2019   Retinal hemorrhage of right eye 11/05/2019   Retinal hemorrhage of left eye 11/05/2019   Diabetes mellitus (HCC) 05/14/2019   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, with long-term current use of insulin  (HCC) 05/14/2019   Type 2 diabetes mellitus with diabetic polyneuropathy, with long-term current use of insulin  (HCC) 05/14/2019   History of gout 05/20/2018   Primary localized osteoarthritis of right knee 05/13/2018   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) 12/20/2017   Obesity (BMI 30-39.9) 11/11/2017   OSA (obstructive sleep apnea) 02/18/2017   Essential  hypertension    L Breast Cancer    Hypothyroidism     Current Outpatient Medications:    acetaminophen  (TYLENOL ) 500 MG tablet, Take 500-1,000 mg by mouth every 6 (six) hours as needed (for pain or headaches)., Disp: , Rfl:    acetic acid 2 % otic solution, Place 4 drops into the left ear daily., Disp: , Rfl:     allopurinol  (ZYLOPRIM ) 100 MG tablet, Take 100 mg by mouth daily., Disp: , Rfl:    apixaban  (ELIQUIS ) 5 MG TABS tablet, Take 1 tablet (5 mg total) by mouth 2 (two) times daily., Disp: 180 tablet, Rfl: 3   atorvastatin  (LIPITOR ) 80 MG tablet, Take 1 tablet (80 mg total) by mouth at bedtime., Disp: 100 tablet, Rfl: 3   B Complex-Biotin -FA (B-COMPLEX PO), Take 1 tablet by mouth daily., Disp: , Rfl:    Biotin  10 MG TABS, Take 10 mg by mouth daily., Disp: , Rfl:    Blood Glucose Monitoring Suppl (ONETOUCH VERIO) w/Device KIT, Use to check blood sugar, Disp: 1 kit, Rfl: 0   carvedilol  (COREG ) 3.125 MG tablet, Take 1 tablet (3.125 mg total) by mouth 2 (two) times daily with a meal., Disp: 180 tablet, Rfl: 3   cetirizine (ZYRTEC) 10 MG tablet, Take 10 mg by mouth daily., Disp: , Rfl:    Cinnamon 500 MG capsule, Take 2,000 mg by mouth daily., Disp: , Rfl:    clotrimazole-betamethasone (LOTRISONE) lotion, Apply 1 application topically 2 (two) times daily as needed (itching)., Disp: , Rfl:    Continuous Glucose Sensor (DEXCOM G6 SENSOR) MISC, 1 Device by Does not apply route as directed., Disp: 9 each, Rfl: 3   Continuous Glucose Transmitter (DEXCOM G6 TRANSMITTER) MISC, Change every 90 days, Disp: 1 each, Rfl: 3   empagliflozin  (JARDIANCE ) 10 MG TABS tablet, Take 1 tablet (10 mg total) by mouth daily., Disp: 90 tablet, Rfl: 3   ferrous sulfate  325 (65 FE) MG tablet, Take 1 tablet (325 mg total) by mouth 3 (three) times a week., Disp: 45 tablet, Rfl: 3   folic acid  (FOLVITE ) 1 MG tablet, Take 1 tablet (1 mg total) by mouth daily., Disp: 90 tablet, Rfl: 3   gabapentin  (NEURONTIN ) 300 MG capsule, Take 2 capsules (600 mg total) by mouth 2 (two) times daily., Disp: 360 capsule, Rfl: 3   glucose blood (ONETOUCH VERIO) test strip, Check blood sugar 2 times daily, Disp: 100 each, Rfl: 12   insulin  aspart (NOVOLOG  FLEXPEN) 100 UNIT/ML FlexPen, DIAL  AND INJECT UNDER THE SKIN THREE TIMES DAILY PER CORRECTION SCALE. MAX  DAILY DOSE 50 UNITS, Disp: 15 mL, Rfl: 0   Insulin  Glargine (BASAGLAR  KWIKPEN) 100 UNIT/ML, Inject 40 Units into the skin at bedtime., Disp: 45 mL, Rfl: 2   Insulin  Pen Needle 32G X 4 MM MISC, 1 Device by Does not apply route in the morning, at noon, in the evening, and at bedtime., Disp: 400 each, Rfl: 3   Lancets (ONETOUCH DELICA PLUS LANCET33G) MISC, USE   TO CHECK GLUCOSE 4 TIMES DAILY, Disp: 100 each, Rfl: 3   latanoprost  (XALATAN ) 0.005 % ophthalmic solution, Place 1 drop into both eyes at bedtime., Disp: , Rfl:    levothyroxine  (SYNTHROID , LEVOTHROID) 75 MCG tablet, Take 75 mcg by mouth daily before breakfast., Disp: , Rfl:    Multiple Vitamins-Minerals (ALIVE ONCE DAILY WOMENS 50+ PO), Take 1 tablet by mouth daily., Disp: , Rfl:    Omega-3 Fatty Acids (FISH OIL) 1200 MG CAPS, Take 1,200 mg by mouth  at bedtime. Taking 2 capsules at night, Disp: , Rfl:    spironolactone  (ALDACTONE ) 25 MG tablet, Take 1 tablet (25 mg total) by mouth daily., Disp: 90 tablet, Rfl: 3   torsemide  (DEMADEX ) 20 MG tablet, Take 3 tablets (60 mg total) by mouth daily., Disp: 90 tablet, Rfl: 3   gabapentin  (NEURONTIN ) 300 MG capsule, Take 1 capsule (300 mg total) by mouth 2 (two) times daily. (Patient not taking: Reported on 03/05/2024), Disp: 60 capsule, Rfl: 2   LUMIGAN 0.01 % SOLN, Place 1 drop into both eyes at bedtime. (Patient not taking: Reported on 02/27/2024), Disp: , Rfl:    ondansetron  (ZOFRAN ) 4 MG tablet, Take 1 tablet (4 mg total) by mouth every 8 (eight) hours as needed for nausea or vomiting. (Patient not taking: Reported on 02/27/2024), Disp: 20 tablet, Rfl: 0   Travoprost, BAK Free, (TRAVATAN) 0.004 % SOLN ophthalmic solution, Place 1 drop into both eyes at bedtime. (Patient not taking: Reported on 02/27/2024), Disp: , Rfl:  Allergies  Allergen Reactions   Codeine Other (See Comments)   Other Other (See Comments)    Blood Product Refusal (Jehovah's witness)     Tape Other (See Comments)    Leaves  blisters and marks on the skin   Sulfa Antibiotics Rash   Uloric  [Febuxostat ] Rash     Social History   Socioeconomic History   Marital status: Widowed    Spouse name: Not on file   Number of children: 3   Years of education: Not on file   Highest education level: Some college, no degree  Occupational History   Occupation: retired  Tobacco Use   Smoking status: Former    Current packs/day: 0.00    Average packs/day: 1 pack/day for 22.0 years (22.0 ttl pk-yrs)    Types: Cigarettes    Start date: 33    Quit date: 42    Years since quitting: 42.8   Smokeless tobacco: Never  Vaping Use   Vaping status: Never Used  Substance and Sexual Activity   Alcohol use: Not Currently    Comment: 05/22/2018 glass of wine couple times/wk   Drug use: Not Currently   Sexual activity: Not Currently    Birth control/protection: None, Post-menopausal  Other Topics Concern   Not on file  Social History Narrative   Not on file   Social Drivers of Health   Financial Resource Strain: Medium Risk (12/22/2021)   Overall Financial Resource Strain (CARDIA)    Difficulty of Paying Living Expenses: Somewhat hard  Food Insecurity: No Food Insecurity (05/08/2018)   Hunger Vital Sign    Worried About Running Out of Food in the Last Year: Never true    Ran Out of Food in the Last Year: Never true  Transportation Needs: Unmet Transportation Needs (05/08/2022)   PRAPARE - Administrator, Civil Service (Medical): Yes    Lack of Transportation (Non-Medical): Yes  Physical Activity: Not on file  Stress: Not on file  Social Connections: Not on file  Intimate Partner Violence: Not on file    Physical Exam      Future Appointments  Date Time Provider Department Center  04/14/2024  7:05 AM [facility] HVT DEVICE REMOTES [facility]  07/14/2024  7:05 AM [facility] H[facility] DEVICE REMOTES [facility]  10/13/2024  7:05 AM [facility] HVT DEVICE REMOTES [facility]  01/12/2025  7:05 AM CVD HVT DEVICE  REMOTES [facility]  04/13/2025  7:05 AM [facility] HVT DEVICE REMOTES [facility]  07/13/2025  7:05 AM [facility] HVT DEVICE REMOTES [facility]

## 2024-03-13 DIAGNOSIS — E119 Type 2 diabetes mellitus without complications: Secondary | ICD-10-CM | POA: Diagnosis not present

## 2024-03-13 DIAGNOSIS — H6123 Impacted cerumen, bilateral: Secondary | ICD-10-CM | POA: Diagnosis not present

## 2024-03-13 DIAGNOSIS — H608X3 Other otitis externa, bilateral: Secondary | ICD-10-CM | POA: Diagnosis not present

## 2024-03-19 ENCOUNTER — Other Ambulatory Visit (HOSPITAL_COMMUNITY): Payer: Self-pay

## 2024-03-19 NOTE — Progress Notes (Signed)
 Paramedicine Encounter    Patient ID: Danielle [name], female    DOB: Sep 28, 1945, 78 y.o.   MRN: [id number]   Complaints- dizziness on position (vertigo or inner ear infection)   Assessment- CAOX4, warm and dry seated on couch, says she is dizzy when she lays in certain positions or moved her neck and head a certain way, not orthostatic, vitals within normal limits. No lower leg edema, lungs clear. [id number] (no insulin  this AM)  Compliance with meds- missed two doses   Pill box filled- for one week   Refills needed- dexcom sensor   Meds changes since last visit-    Social changes- none    VISIT SUMMARY- Arrived for home visit for [facility] who reports that she has been having positional dizziness similar to her last episode with inner ear infection or vertigo. Vitals obtained as noted- no ortho vital changes. No lower leg edema, lungs clear. Dexcom changed. Meds reviewed and confirmed. Pill box filled for one week. I plan to see her next week during her PCP visit. Home visit complete.   BP 110/68   Pulse 74   Resp 16   Wt 203 lb (92.1 kg)   SpO2 94%   BMI 34.84 kg/m  Weight yesterday-- none  Last visit weight-- 206lbs      ACTION: Home visit completed     Patient Care Team: Rexanne Ingle, MD as PCP - General (Internal Medicine) Inocencio Soyla Lunger, MD as PCP - Electrophysiology (Cardiology) Shlomo Wilbert SAUNDERS, MD as PCP - Sleep Medicine (Cardiology) Bensimhon, Toribio SAUNDERS, MD as PCP - Cardiology (Cardiology) Cathern Andriette DEL, LCSW as Social Worker (Licensed Clinical Social Worker) Shamleffer, Donell Cardinal, MD as Consulting Physician (Endocrinology)  Patient Active Problem List   Diagnosis Date Noted   Chronic systolic heart failure (HCC) 05/08/2022   Type 2 diabetes mellitus with stage 3a chronic kidney disease, with long-term current use of insulin  (HCC) 02/16/2022   Prosthetic joint implant failure, initial encounter 12/20/2021   Pseudophakia of both eyes 11/15/2021    DVT (deep venous thrombosis) (HCC) 07/31/2021   Acute blood loss anemia    Septic Arthritis, Infection of prosthetic right knee joint (HCC), MSSA infection 07/10/2021   Complication of internal right knee prosthesis 07/10/2021   CAD (coronary artery disease) 06/08/2021   Carotid artery disease 06/08/2021   Symptomatic bradycardia 06/08/2021   Paroxysmal atrial fibrillation (HCC) 06/08/2021   CKD (chronic kidney disease), stage III (HCC) 06/08/2021   Polyneuropathy associated with underlying disease 05/04/2020   Severe nonproliferative diabetic retinopathy of left eye, with macular edema, associated with type 2 diabetes mellitus (HCC) 11/05/2019   Severe nonproliferative diabetic retinopathy of right eye, with macular edema, associated with type 2 diabetes mellitus (HCC) 11/05/2019   Retinal hemorrhage of right eye 11/05/2019   Retinal hemorrhage of left eye 11/05/2019   Diabetes mellitus (HCC) 05/14/2019   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, with long-term current use of insulin  (HCC) 05/14/2019   Type 2 diabetes mellitus with diabetic polyneuropathy, with long-term current use of insulin  (HCC) 05/14/2019   History of gout 05/20/2018   Primary localized osteoarthritis of right knee 05/13/2018   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, Disp: 180 tablet, Rfl: 3   atorvastatin   (LIPITOR ) 80 MG tablet, Take 1 tablet (80 mg total) by mouth at bedtime., Disp: 100 tablet, Rfl: 3   B Complex-Biotin -FA (B-COMPLEX PO), Take 1 tablet by mouth daily., Disp: , Rfl:    Biotin  10 MG TABS, Take 10 mg by mouth daily., Disp: , Rfl:    Blood Glucose Monitoring Suppl (ONETOUCH VERIO) w/Device KIT, Use to check blood sugar, Disp: 1 kit, Rfl: 0   carvedilol  (COREG ) 3.125 MG tablet, Take 1 tablet (3.125 mg total) by mouth 2 (two) times daily with a meal., Disp: 180 tablet, Rfl: 3   cetirizine (ZYRTEC) 10 MG tablet, Take 10 mg by mouth daily., Disp: , Rfl:    Cinnamon 500 MG capsule, Take 2,000 mg by mouth daily., Disp: , Rfl:    clotrimazole-betamethasone (LOTRISONE) lotion, Apply 1 application topically 2 (two) times daily as needed (itching)., Disp: , Rfl:    Continuous Glucose Sensor (DEXCOM G6 SENSOR) MISC, 1 Device by Does not apply route as directed., Disp: 9 each, Rfl: 3   Continuous Glucose Transmitter (DEXCOM G6 TRANSMITTER) MISC, Change every 90 days, Disp: 1 each, Rfl: 3   empagliflozin  (JARDIANCE ) 10 MG TABS tablet, Take 1 tablet (10 mg total) by mouth daily., Disp: 90 tablet, Rfl: 3   ferrous sulfate  325 (65 FE) MG tablet, Take 1 tablet (325 mg total) by mouth 3 (three) times a week., Disp: 45 tablet, Rfl: 3   folic acid  (FOLVITE ) 1 MG tablet, Take 1 tablet (1 mg total) by mouth daily., Disp: 90 tablet, Rfl: 3   gabapentin  (NEURONTIN ) 300 MG capsule, Take 2 capsules (600 mg total) by mouth 2 (two) times daily., Disp: 360 capsule, Rfl: 3   glucose blood (ONETOUCH VERIO) test strip, Check blood sugar 2 times daily, Disp: 100 each, Rfl: 12   insulin  aspart (NOVOLOG  FLEXPEN) 100 UNIT/ML FlexPen, DIAL  AND INJECT UNDER THE SKIN THREE TIMES DAILY PER CORRECTION SCALE. MAX DAILY DOSE 50 UNITS, Disp: 15 mL, Rfl: 0   Insulin  Glargine (BASAGLAR  KWIKPEN) 100 UNIT/ML, Inject 40 Units into the skin at bedtime., Disp: 45 mL, Rfl: 2   Insulin  Pen Needle 32G X 4 MM MISC, 1 Device by Does not  apply route in the morning, at noon, in the evening, and at bedtime., Disp: 400 each, Rfl: 3   Lancets (ONETOUCH DELICA PLUS LANCET33G) MISC, USE   TO CHECK GLUCOSE 4 TIMES DAILY, Disp: 100 each, Rfl: 3   latanoprost  (XALATAN ) 0.005 % ophthalmic solution, Place 1 drop into both eyes at bedtime., Disp: , Rfl:    levothyroxine  (SYNTHROID , LEVOTHROID) 75 MCG tablet, Take 75 mcg by mouth daily before breakfast., Disp: , Rfl:    Multiple Vitamins-Minerals (ALIVE ONCE DAILY WOMENS 50+ PO), Take 1 tablet by mouth daily., Disp: , Rfl:    Omega-3 Fatty Acids (FISH OIL) 1200 MG CAPS, Take 1,200 mg by mouth at bedtime. Taking 2 capsules at night, Disp: , Rfl:    spironolactone  (ALDACTONE ) 25 MG tablet, Take 1 tablet (25 mg total) by mouth daily., Disp: 90 tablet, Rfl: 3   torsemide  (  DEMADEX ) 20 MG tablet, Take 3 tablets (60 mg total) by mouth daily., Disp: 90 tablet, Rfl: 3   gabapentin  (NEURONTIN ) 300 MG capsule, Take 1 capsule (300 mg total) by mouth 2 (two) times daily. (Patient not taking: Reported on 03/19/2024), Disp: 60 capsule, Rfl: 2   LUMIGAN 0.01 % SOLN, Place 1 drop into both eyes at bedtime. (Patient not taking: Reported on 03/19/2024), Disp: , Rfl:    ondansetron  (ZOFRAN ) 4 MG tablet, Take 1 tablet (4 mg total) by mouth every 8 (eight) hours as needed for nausea or vomiting. (Patient not taking: Reported on 03/19/2024), Disp: 20 tablet, Rfl: 0   Travoprost, BAK Free, (TRAVATAN) 0.004 % SOLN ophthalmic solution, Place 1 drop into both eyes at bedtime. (Patient not taking: Reported on 03/19/2024), Disp: , Rfl:  Allergies  Allergen Reactions   Codeine Other (See Comments)   Other Other (See Comments)    Blood Product Refusal (Jehovah's witness)     Tape Other (See Comments)    Leaves blisters and marks on the skin   Sulfa Antibiotics Rash   Uloric  [Febuxostat ] Rash     Social History   Socioeconomic History   Marital status: Widowed    Spouse name: Not on file   Number of children: 3    Years of education: Not on file   Highest education level: Some college, no degree  Occupational History   Occupation: retired  Tobacco Use   Smoking status: Former    Current packs/day: 0.00    Average packs/day: 1 pack/day for 22.0 years (22.0 ttl pk-yrs)    Types: Cigarettes    Start date: 19    Quit date: 25    Years since quitting: 42.8   Smokeless tobacco: Never  Vaping Use   Vaping status: Never Used  Substance and Sexual Activity   Alcohol use: Not Currently    Comment: 05/22/2018 glass of wine couple times/wk   Drug use: Not Currently   Sexual activity: Not Currently    Birth control/protection: None, Post-menopausal  Other Topics Concern   Not on file  Social History Narrative   Not on file   Social Drivers of Health   Financial Resource Strain: Medium Risk (12/22/2021)   Overall Financial Resource Strain (CARDIA)    Difficulty of Paying Living Expenses: Somewhat hard  Food Insecurity: No Food Insecurity (05/08/2018)   Hunger Vital Sign    Worried About Running Out of Food in the Last Year: Never true    Ran Out of Food in the Last Year: Never true  Transportation Needs: Unmet Transportation Needs (05/08/2022)   PRAPARE - Administrator, Civil Service (Medical): Yes    Lack of Transportation (Non-Medical): Yes  Physical Activity: Not on file  Stress: Not on file  Social Connections: Not on file  Intimate Partner Violence: Not on file    Physical Exam      Future Appointments  Date Time Provider Department Center  04/14/2024  7:05 AM [facility] HVT DEVICE REMOTES [facility]  07/14/2024  7:05 AM [facility] DEVICE REMOTES [facility]  10/13/2024  7:05 AM [facility] DEVICE REMOTES [facility]  01/12/2025  7:05 AM [facility] DEVICE REMOTES [facility]  04/13/2025  7:05 AM [facility] HVT DEVICE REMOTES [facility]  07/13/2025  7:05 AM [facility]VT DEVICE REMOTES [facility]

## 2024-03-25 DIAGNOSIS — E7800 Pure hypercholesterolemia, unspecified: Secondary | ICD-10-CM | POA: Diagnosis not present

## 2024-03-25 DIAGNOSIS — Z1331 Encounter for screening for depression: Secondary | ICD-10-CM | POA: Diagnosis not present

## 2024-03-25 DIAGNOSIS — Z23 Encounter for immunization: Secondary | ICD-10-CM | POA: Diagnosis not present

## 2024-03-25 DIAGNOSIS — E113291 Type 2 diabetes mellitus with mild nonproliferative diabetic retinopathy without macular edema, right eye: Secondary | ICD-10-CM | POA: Diagnosis not present

## 2024-03-25 DIAGNOSIS — Z0000 Encounter for general adult medical examination without abnormal findings: Secondary | ICD-10-CM | POA: Diagnosis not present

## 2024-03-26 ENCOUNTER — Other Ambulatory Visit (HOSPITAL_COMMUNITY): Payer: Self-pay

## 2024-03-26 NOTE — Progress Notes (Signed)
 Paramedicine Encounter    Patient ID: Danielle [name], female    DOB: 03-16-1946, 78 y.o.   MRN: [id number]   Complaints- high blood sugar readings    Assessment- CAOX4, warm and dry with, complaining of higher blood sugar readings despite her insulin  and limits carbs and sugar. Lungs clear, vitals within normal other than her sugar. No lower leg edema.   Compliance with meds- no missed doses   Pill box filled- for one week   Refills needed- basaglar , carvedilol    Meds changes since last visit- none     Social changes- none    VISIT SUMMAR- Arrived for home visit for Danielle [name] who reports to be feeling okay. She states her blood sugars have been running high despite her insulin  and trying her best to maintain her diet. Her blood sugar spiked up past 350 last night. We called her endocrinology and got her set up for an appointment with Dr. [facility] tomorrow at 11:00. I plan to follow up with more DM education once she gets meds adjustments. Vitals within normal limits otherwise. No edema noted. Lungs clear. I reviewed meds and filled pill box for one week. Refills called into [facility]. Appointments and education provided. I also coached her on how to use her CPAP- she has been struggling with getting the straps correctly tightened. Home visit complete. I will see her in one week.   BP 124/74   Pulse 80   Resp 16   Wt 200 lb (90.7 kg)   SpO2 96%   BMI 34.33 kg/m  Weight yesterday-- 204lbs  Last visit weight-- 203lbs      ACTION: Home visit completed     Patient Care Team: Rexanne Ingle, MD as PCP - General (Internal Medicine) Inocencio Soyla Lunger, MD as PCP - Electrophysiology (Cardiology) Shlomo Wilbert SAUNDERS, MD as PCP - Sleep Medicine (Cardiology) Bensimhon, Toribio SAUNDERS, MD as PCP - Cardiology (Cardiology) Cathern Andriette DEL, LCSW as Social Worker (Licensed Clinical Social Worker) Shamleffer, Donell Cardinal, MD as Consulting Physician (Endocrinology)  Patient Active Problem List    Diagnosis Date Noted   Chronic systolic heart failure (HCC) 05/08/2022   Type 2 diabetes mellitus with stage 3a chronic kidney disease, with long-term current use of insulin  (HCC) 02/16/2022   Prosthetic joint implant failure, initial encounter 12/20/2021   Pseudophakia of both eyes 11/15/2021   DVT (deep venous thrombosis) (HCC) 07/31/2021   Acute blood loss anemia    Septic Arthritis, Infection of prosthetic right knee joint (HCC), MSSA infection 07/10/2021   Complication of internal right knee prosthesis 07/10/2021   CAD (coronary artery disease) 06/08/2021   Carotid artery disease 06/08/2021   Symptomatic bradycardia 06/08/2021   Paroxysmal atrial fibrillation (HCC) 06/08/2021   CKD (chronic kidney disease), stage III (HCC) 06/08/2021   Polyneuropathy associated with underlying disease 05/04/2020   Severe nonproliferative diabetic retinopathy of left eye, with macular edema, associated with type 2 diabetes mellitus (HCC) 11/05/2019   Severe nonproliferative diabetic retinopathy of right eye, with macular edema, associated with type 2 diabetes mellitus (HCC) 11/05/2019   Retinal hemorrhage of right eye 11/05/2019   Retinal hemorrhage of left eye 11/05/2019   Diabetes mellitus (HCC) 05/14/2019   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, with long-term current use of insulin  (HCC) 05/14/2019   Type 2 diabetes mellitus with diabetic polyneuropathy, with long-term current use of insulin  (HCC) 05/14/2019   History of gout 05/20/2018   Primary localized osteoarthritis of right knee 05/13/2018   HFmrEF (heart failure with mildly  reduced EF) 12/20/2017   Obesity (BMI 30-39.9) 11/11/2017   OSA (obstructive sleep apnea) 02/18/2017   Essential hypertension    L Breast Cancer    Hypothyroidism     Current Outpatient Medications:    acetaminophen  (TYLENOL ) 500 MG tablet, Take 500-1,000 mg by mouth every 6 (six) hours as needed (for pain or headaches)., Disp: , Rfl:    acetic  acid 2 % otic solution, Place 4 drops into the left ear daily., Disp: , Rfl:    allopurinol  (ZYLOPRIM ) 100 MG tablet, Take 100 mg by mouth daily., Disp: , Rfl:    apixaban  (ELIQUIS ) 5 MG TABS tablet, Take 1 tablet (5 mg total) by mouth 2 (two) times daily., Disp: 180 tablet, Rfl: 3   atorvastatin  (LIPITOR ) 80 MG tablet, Take 1 tablet (80 mg total) by mouth at bedtime., Disp: 100 tablet, Rfl: 3   B Complex-Biotin -FA (B-COMPLEX PO), Take 1 tablet by mouth daily., Disp: , Rfl:    Biotin  10 MG TABS, Take 10 mg by mouth daily., Disp: , Rfl:    Blood Glucose Monitoring Suppl (ONETOUCH VERIO) w/Device KIT, Use to check blood sugar, Disp: 1 kit, Rfl: 0   carvedilol  (COREG ) 3.125 MG tablet, Take 1 tablet (3.125 mg total) by mouth 2 (two) times daily with a meal., Disp: 180 tablet, Rfl: 3   cetirizine (ZYRTEC) 10 MG tablet, Take 10 mg by mouth daily., Disp: , Rfl:    Cinnamon 500 MG capsule, Take 2,000 mg by mouth daily., Disp: , Rfl:    clotrimazole-betamethasone (LOTRISONE) lotion, Apply 1 application topically 2 (two) times daily as needed (itching)., Disp: , Rfl:    Continuous Glucose Sensor (DEXCOM G6 SENSOR) MISC, 1 Device by Does not apply route as directed., Disp: 9 each, Rfl: 3   Continuous Glucose Transmitter (DEXCOM G6 TRANSMITTER) MISC, Change every 90 days, Disp: 1 each, Rfl: 3   empagliflozin  (JARDIANCE ) 10 MG TABS tablet, Take 1 tablet (10 mg total) by mouth daily., Disp: 90 tablet, Rfl: 3   ferrous sulfate  325 (65 FE) MG tablet, Take 1 tablet (325 mg total) by mouth 3 (three) times a week., Disp: 45 tablet, Rfl: 3   folic acid  (FOLVITE ) 1 MG tablet, Take 1 tablet (1 mg total) by mouth daily., Disp: 90 tablet, Rfl: 3   gabapentin  (NEURONTIN ) 300 MG capsule, Take 2 capsules (600 mg total) by mouth 2 (two) times daily., Disp: 360 capsule, Rfl: 3   glucose blood (ONETOUCH VERIO) test strip, Check blood sugar 2 times daily, Disp: 100 each, Rfl: 12   insulin  aspart (NOVOLOG  FLEXPEN) 100 UNIT/ML  FlexPen, DIAL  AND INJECT UNDER THE SKIN THREE TIMES DAILY PER CORRECTION SCALE. MAX DAILY DOSE 50 UNITS, Disp: 15 mL, Rfl: 0   Insulin  Glargine (BASAGLAR  KWIKPEN) 100 UNIT/ML, Inject 40 Units into the skin at bedtime., Disp: 45 mL, Rfl: 2   Insulin  Pen Needle 32G X 4 MM MISC, 1 Device by Does not apply route in the morning, at noon, in the evening, and at bedtime., Disp: 400 each, Rfl: 3   Lancets (ONETOUCH DELICA PLUS LANCET33G) MISC, USE   TO CHECK GLUCOSE 4 TIMES DAILY, Disp: 100 each, Rfl: 3   latanoprost  (XALATAN ) 0.005 % ophthalmic solution, Place 1 drop into both eyes at bedtime., Disp: , Rfl:    levothyroxine  (SYNTHROID , LEVOTHROID) 75 MCG tablet, Take 75 mcg by mouth daily before breakfast., Disp: , Rfl:    Multiple Vitamins-Minerals (ALIVE ONCE DAILY WOMENS 50+ PO), Take 1 tablet by mouth daily.,  Disp: , Rfl:    Omega-3 Fatty Acids (FISH OIL) 1200 MG CAPS, Take 1,200 mg by mouth at bedtime. Taking 2 capsules at night, Disp: , Rfl:    spironolactone  (ALDACTONE ) 25 MG tablet, Take 1 tablet (25 mg total) by mouth daily., Disp: 90 tablet, Rfl: 3   torsemide  (DEMADEX ) 20 MG tablet, Take 3 tablets (60 mg total) by mouth daily., Disp: 90 tablet, Rfl: 3   gabapentin  (NEURONTIN ) 300 MG capsule, Take 1 capsule (300 mg total) by mouth 2 (two) times daily. (Patient not taking: Reported on 03/26/2024), Disp: 60 capsule, Rfl: 2   LUMIGAN 0.01 % SOLN, Place 1 drop into both eyes at bedtime. (Patient not taking: Reported on 03/26/2024), Disp: , Rfl:    ondansetron  (ZOFRAN ) 4 MG tablet, Take 1 tablet (4 mg total) by mouth every 8 (eight) hours as needed for nausea or vomiting. (Patient not taking: Reported on 03/19/2024), Disp: 20 tablet, Rfl: 0   Travoprost, BAK Free, (TRAVATAN) 0.004 % SOLN ophthalmic solution, Place 1 drop into both eyes at bedtime. (Patient not taking: Reported on 03/26/2024), Disp: , Rfl:  Allergies  Allergen Reactions   Codeine Other (See Comments)   Other Other (See Comments)     Blood Product Refusal (Jehovah's witness)     Tape Other (See Comments)    Leaves blisters and marks on the skin   Sulfa Antibiotics Rash   Uloric  [Febuxostat ] Rash     Social History   Socioeconomic History   Marital status: Widowed    Spouse name: Not on file   Number of children: 3   Years of education: Not on file   Highest education level: Some college, no degree  Occupational History   Occupation: retired  Tobacco Use   Smoking status: Former    Current packs/day: 0.00    Average packs/day: 1 pack/day for 22.0 years (22.0 ttl pk-yrs)    Types: Cigarettes    Start date: 59    Quit date: 66    Years since quitting: 42.8   Smokeless tobacco: Never  Vaping Use   Vaping status: Never Used  Substance and Sexual Activity   Alcohol use: Not Currently    Comment: 05/22/2018 glass of wine couple times/wk   Drug use: Not Currently   Sexual activity: Not Currently    Birth control/protection: None, Post-menopausal  Other Topics Concern   Not on file  Social History Narrative   Not on file   Social Drivers of Health   Financial Resource Strain: Medium Risk (12/22/2021)   Overall Financial Resource Strain (CARDIA)    Difficulty of Paying Living Expenses: Somewhat hard  Food Insecurity: No Food Insecurity (05/08/2018)   Hunger Vital Sign    Worried About Running Out of Food in the Last Year: Never true    Ran Out of Food in the Last Year: Never true  Transportation Needs: Unmet Transportation Needs (05/08/2022)   PRAPARE - Administrator, Civil Service (Medical): Yes    Lack of Transportation (Non-Medical): Yes  Physical Activity: Not on file  Stress: Not on file  Social Connections: Not on file  Intimate Partner Violence: Not on file    Physical Exam      Future Appointments  Date Time Provider Department Center  04/14/2024  7:05 AM [facility] H[facility] DEVICE REMOTES [facility]  07/14/2024  7:05 AM [facility] DEVICE REMOTES [facility]  10/13/2024   7:05 AM [facility] DEVICE REMOTES [facility]  01/12/2025  7:05 AM  [facility] DEVICE REMOTES [facility]  04/13/2025  7:05 AM [facility] H[facility] DEVICE REMOTES [facility]  07/13/2025  7:05 AM [facility] H[facility] DEVICE REMOTES [facility]

## 2024-03-27 DIAGNOSIS — E1142 Type 2 diabetes mellitus with diabetic polyneuropathy: Secondary | ICD-10-CM | POA: Diagnosis not present

## 2024-03-27 DIAGNOSIS — N1832 Chronic kidney disease, stage 3b: Secondary | ICD-10-CM | POA: Diagnosis not present

## 2024-03-27 DIAGNOSIS — I10 Essential (primary) hypertension: Secondary | ICD-10-CM | POA: Diagnosis not present

## 2024-03-27 DIAGNOSIS — I2510 Atherosclerotic heart disease of native coronary artery without angina pectoris: Secondary | ICD-10-CM | POA: Diagnosis not present

## 2024-03-27 DIAGNOSIS — E1165 Type 2 diabetes mellitus with hyperglycemia: Secondary | ICD-10-CM | POA: Diagnosis not present

## 2024-03-27 DIAGNOSIS — E7800 Pure hypercholesterolemia, unspecified: Secondary | ICD-10-CM | POA: Diagnosis not present

## 2024-03-27 DIAGNOSIS — E113413 Type 2 diabetes mellitus with severe nonproliferative diabetic retinopathy with macular edema, bilateral: Secondary | ICD-10-CM | POA: Diagnosis not present

## 2024-03-27 DIAGNOSIS — D631 Anemia in chronic kidney disease: Secondary | ICD-10-CM | POA: Diagnosis not present

## 2024-03-27 DIAGNOSIS — I5022 Chronic systolic (congestive) heart failure: Secondary | ICD-10-CM | POA: Diagnosis not present

## 2024-03-27 DIAGNOSIS — E039 Hypothyroidism, unspecified: Secondary | ICD-10-CM | POA: Diagnosis not present

## 2024-03-27 DIAGNOSIS — G4730 Sleep apnea, unspecified: Secondary | ICD-10-CM | POA: Diagnosis not present

## 2024-04-02 ENCOUNTER — Other Ambulatory Visit (HOSPITAL_COMMUNITY): Payer: Self-pay

## 2024-04-02 NOTE — Progress Notes (Signed)
 Paramedicine Encounter    Patient ID: Danielle [name], female    DOB: [birth year], 78 y.o.   MRN: [id number]   Complaints- ear pain   Assessment- CAOx4, warm and dry ambulatory with no shortness of breath, dizziness or chest pain,   Compliance with meds- missed one night dose   Pill box filled- two weeks   Refills needed- Jardiance , torsemide , iron    Meds changes since last visit- none     Social changes- none    VISIT SUMMARY- Arrived for home visit for [facility] who reports to be feeling well with no complaints. She denied any chest pain, dizziness or shortness of breath. She does have some lower leg swelling noted. I advised her to be sure she is elevating and wearing her compressions. Vitals obtained as noted. We spent time reviewed HF and DM education. New insulin  doses adjusted by ENDO - dexcom applied. Meds reviewed and confirmed. Pill box filled for two weeks. Refills as noted will be called in. Appointments reviewed. Home visit complete.   BP 120/78   Pulse 82   Resp 18   Wt 198 lb 9.6 oz (90.1 kg)   SpO2 95%   BMI 34.09 kg/m  Weight yesterday-- 198 Last visit weight-- 200     ACTION: Home visit completed     Patient Care Team: Rexanne Ingle, MD as PCP - General (Internal Medicine) Inocencio Soyla Lunger, MD as PCP - Electrophysiology (Cardiology) Shlomo Wilbert SAUNDERS, MD as PCP - Sleep Medicine (Cardiology) Bensimhon, Toribio SAUNDERS, MD as PCP - Cardiology (Cardiology) Cathern Andriette DEL, LCSW as Social Worker (Licensed Clinical Social Worker) Shamleffer, Donell Cardinal, MD as Consulting Physician (Endocrinology)  Patient Active Problem List   Diagnosis Date Noted   Chronic systolic heart failure (HCC) 05/08/2022   Type 2 diabetes mellitus with stage 3a chronic kidney disease, with long-term current use of insulin  (HCC) 02/16/2022   Prosthetic joint implant failure, initial encounter 12/20/2021   Pseudophakia of both eyes 11/15/2021   DVT (deep venous thrombosis) (HCC)  07/31/2021   Acute blood loss anemia    Septic Arthritis, Infection of prosthetic right knee joint (HCC), MSSA infection 07/10/2021   Complication of internal right knee prosthesis 07/10/2021   CAD (coronary artery disease) 06/08/2021   Carotid artery disease 06/08/2021   Symptomatic bradycardia 06/08/2021   Paroxysmal atrial fibrillation (HCC) 06/08/2021   CKD (chronic kidney disease), stage III (HCC) 06/08/2021   Polyneuropathy associated with underlying disease 05/04/2020   Severe nonproliferative diabetic retinopathy of left eye, with macular edema, associated with type 2 diabetes mellitus (HCC) 11/05/2019   Severe nonproliferative diabetic retinopathy of right eye, with macular edema, associated with type 2 diabetes mellitus (HCC) 11/05/2019   Retinal hemorrhage of right eye 11/05/2019   Retinal hemorrhage of left eye 11/05/2019   Diabetes mellitus (HCC) 05/14/2019   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, with long-term current use of insulin  (HCC) 05/14/2019   Type 2 diabetes mellitus with diabetic polyneuropathy, with long-term current use of insulin  (HCC) 05/14/2019   History of gout 05/20/2018   Primary localized osteoarthritis of right knee 05/13/2018   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, Disp: 180 tablet, Rfl: 3   atorvastatin  (LIPITOR ) 80 MG tablet, Take 1 tablet  (80 mg total) by mouth at bedtime., Disp: 100 tablet, Rfl: 3   B Complex-Biotin -FA (B-COMPLEX PO), Take 1 tablet by mouth daily., Disp: , Rfl:    Biotin  10 MG TABS, Take 10 mg by mouth daily., Disp: , Rfl:    Blood Glucose Monitoring Suppl (ONETOUCH VERIO) w/Device KIT, Use to check blood sugar, Disp: 1 kit, Rfl: 0   carvedilol  (COREG ) 3.125 MG tablet, Take 1 tablet (3.125 mg total) by mouth 2 (two) times daily with a meal., Disp: 180 tablet, Rfl: 3   cetirizine (ZYRTEC) 10 MG tablet, Take 10 mg by mouth daily., Disp: , Rfl:    Cinnamon 500 MG capsule, Take 2,000 mg by mouth daily., Disp: , Rfl:    clotrimazole-betamethasone (LOTRISONE) lotion, Apply 1 application topically 2 (two) times daily as needed (itching)., Disp: , Rfl:    Continuous Glucose Sensor (DEXCOM G6 SENSOR) MISC, 1 Device by Does not apply route as directed., Disp: 9 each, Rfl: 3   Continuous Glucose Transmitter (DEXCOM G6 TRANSMITTER) MISC, Change every 90 days, Disp: 1 each, Rfl: 3   empagliflozin  (JARDIANCE ) 10 MG TABS tablet, Take 1 tablet (10 mg total) by mouth daily., Disp: 90 tablet, Rfl: 3   ferrous sulfate  325 (65 FE) MG tablet, Take 1 tablet (325 mg total) by mouth 3 (three) times a week., Disp: 45 tablet, Rfl: 3   folic acid  (FOLVITE ) 1 MG tablet, Take 1 tablet (1 mg total) by mouth daily., Disp: 90 tablet, Rfl: 3   gabapentin  (NEURONTIN ) 300 MG capsule, Take 2 capsules (600 mg total) by mouth 2 (two) times daily., Disp: 360 capsule, Rfl: 3   glucose blood (ONETOUCH VERIO) test strip, Check blood sugar 2 times daily, Disp: 100 each, Rfl: 12   insulin  aspart (NOVOLOG  FLEXPEN) 100 UNIT/ML FlexPen, DIAL  AND INJECT UNDER THE SKIN THREE TIMES DAILY PER CORRECTION SCALE. MAX DAILY DOSE 50 UNITS, Disp: 15 mL, Rfl: 0   Insulin  Glargine (BASAGLAR  KWIKPEN) 100 UNIT/ML, Inject 40 Units into the skin at bedtime. (Patient taking differently: Inject 50 Units into the skin at bedtime.), Disp: 45 mL, Rfl: 2   Insulin  Pen Needle 32G X 4  MM MISC, 1 Device by Does not apply route in the morning, at noon, in the evening, and at bedtime., Disp: 400 each, Rfl: 3   Lancets (ONETOUCH DELICA PLUS LANCET33G) MISC, USE   TO CHECK GLUCOSE 4 TIMES DAILY, Disp: 100 each, Rfl: 3   latanoprost  (XALATAN ) 0.005 % ophthalmic solution, Place 1 drop into both eyes at bedtime., Disp: , Rfl:    levothyroxine  (SYNTHROID , LEVOTHROID) 75 MCG tablet, Take 75 mcg by mouth daily before breakfast., Disp: , Rfl:    Multiple Vitamins-Minerals (ALIVE ONCE DAILY WOMENS 50+ PO), Take 1 tablet by mouth daily., Disp: , Rfl:    Omega-3 Fatty Acids (FISH OIL) 1200 MG CAPS, Take 1,200 mg by mouth at bedtime. Taking 2 capsules at night, Disp: , Rfl:    spironolactone  (ALDACTONE ) 25 MG tablet, Take 1 tablet (25 mg total) by mouth daily., Disp: 90 tablet, Rfl: 3  torsemide  (DEMADEX ) 20 MG tablet, Take 3 tablets (60 mg total) by mouth daily., Disp: 90 tablet, Rfl: 3   gabapentin  (NEURONTIN ) 300 MG capsule, Take 1 capsule (300 mg total) by mouth 2 (two) times daily. (Patient not taking: Reported on 03/26/2024), Disp: 60 capsule, Rfl: 2   LUMIGAN 0.01 % SOLN, Place 1 drop into both eyes at bedtime. (Patient not taking: Reported on 03/26/2024), Disp: , Rfl:    ondansetron  (ZOFRAN ) 4 MG tablet, Take 1 tablet (4 mg total) by mouth every 8 (eight) hours as needed for nausea or vomiting. (Patient not taking: Reported on 03/19/2024), Disp: 20 tablet, Rfl: 0   Travoprost, BAK Free, (TRAVATAN) 0.004 % SOLN ophthalmic solution, Place 1 drop into both eyes at bedtime. (Patient not taking: Reported on 04/02/2024), Disp: , Rfl:  Allergies  Allergen Reactions   Codeine Other (See Comments)   Other Other (See Comments)    Blood Product Refusal (Jehovah's witness)     Tape Other (See Comments)    Leaves blisters and marks on the skin   Sulfa Antibiotics Rash   Uloric  [Febuxostat ] Rash     Social History   Socioeconomic History   Marital status: Widowed    Spouse name: Not on  file   Number of children: 3   Years of education: Not on file   Highest education level: Some college, no degree  Occupational History   Occupation: retired  Tobacco Use   Smoking status: Former    Current packs/day: 0.00    Average packs/day: 1 pack/day for 22.0 years (22.0 ttl pk-yrs)    Types: Cigarettes    Start date: 79    Quit date: 30    Years since quitting: 42.8   Smokeless tobacco: Never  Vaping Use   Vaping status: Never Used  Substance and Sexual Activity   Alcohol use: Not Currently    Comment: 05/22/2018 glass of wine couple times/wk   Drug use: Not Currently   Sexual activity: Not Currently    Birth control/protection: None, Post-menopausal  Other Topics Concern   Not on file  Social History Narrative   Not on file   Social Drivers of Health   Financial Resource Strain: Medium Risk (12/22/2021)   Overall Financial Resource Strain (CARDIA)    Difficulty of Paying Living Expenses: Somewhat hard  Food Insecurity: No Food Insecurity (05/08/2018)   Hunger Vital Sign    Worried About Running Out of Food in the Last Year: Never true    Ran Out of Food in the Last Year: Never true  Transportation Needs: Unmet Transportation Needs (05/08/2022)   PRAPARE - Administrator, Civil Service (Medical): Yes    Lack of Transportation (Non-Medical): Yes  Physical Activity: Not on file  Stress: Not on file  Social Connections: Not on file  Intimate Partner Violence: Not on file    Physical Exam      Future Appointments  Date Time Provider Department Center  04/14/2024  7:05 AM [facility] HVT DEVICE REMOTES [facility]  07/14/2024  7:05 AM [facility] DEVICE REMOTES [facility]  10/13/2024  7:05 AM [facility] DEVICE REMOTES [facility]  01/12/2025  7:05 AM [facility] DEVICE REMOTES [facility]  04/13/2025  7:05 AM [facility] DEVICE REMOTES [facility]  07/13/2025  7:05 AM [facility] DEVICE REMOTES [facility]

## 2024-04-03 DIAGNOSIS — H6241 Otitis externa in other diseases classified elsewhere, right ear: Secondary | ICD-10-CM | POA: Diagnosis not present

## 2024-04-03 DIAGNOSIS — H608X3 Other otitis externa, bilateral: Secondary | ICD-10-CM | POA: Diagnosis not present

## 2024-04-03 DIAGNOSIS — B369 Superficial mycosis, unspecified: Secondary | ICD-10-CM | POA: Diagnosis not present

## 2024-04-03 DIAGNOSIS — I10 Essential (primary) hypertension: Secondary | ICD-10-CM | POA: Diagnosis not present

## 2024-04-05 DIAGNOSIS — I10 Essential (primary) hypertension: Secondary | ICD-10-CM | POA: Diagnosis not present

## 2024-04-09 ENCOUNTER — Other Ambulatory Visit (HOSPITAL_COMMUNITY): Payer: Self-pay

## 2024-04-14 ENCOUNTER — Encounter

## 2024-04-16 ENCOUNTER — Other Ambulatory Visit (HOSPITAL_COMMUNITY): Payer: Self-pay

## 2024-04-16 NOTE — Progress Notes (Signed)
 Paramedicine Encounter    Patient ID: Danielle [name], female    DOB: 1946-04-14, 78 y.o.   MRN: [id number]   Complaints- No complaints   Assessment- CAOx4, warm and dry seated on her couch with no complaints, she does have some lower leg swelling, 2lbs weight gain, no shortness of breath, no chest pain, no dizziness. She is wearing her CPAP, med compliant, has not been checking her sugar as she has not been able to put on her sensor even after multiple education sessions. I called her ENDO to see if we can switch to a freestyle.   Compliance with meds- no missed doses   Pill box filled- for two weeks   Refills needed-   Meds changes since last visit-     Social changes- none    VISIT SUMMARY- Arrived for home visit for [facility] who reports to be feeling well with no complaints. She does have some lower leg swelling, no shortness of breath. Her weight is down 4 lbs over night- she denied any missed doses of meds. Vitals and assessment obtained. Meds reviewed and pill boxes filled for two weeks. Dexcom applied. I contacted ENDO to see if she is approved for Freestyle which is easier application for her and lasts two weeks. We discussed importance of her not blindly inserting her insulin  without knowing her blood sugar readings. She understood. I explained HF education and salt and fluid restrictions. Appointments reviewed and confirmed. Home visit complete. I will see her in two weeks.   I called her to get her scheduled with HF clinic as she was to be seen by Bensimhon in Dec. Per [place] back in June- but she was never called to be scheduled. I got her scheduled with an APP on 12/2 at 11.   BP 110/70   Pulse 68   Resp 16   Wt 200 lb (90.7 kg)   SpO2 99%   BMI 34.33 kg/m  Weight yesterday-- 204lbs  Last visit weight-- 198lbs      ACTION: Home visit completed     Patient Care Team: Rexanne Ingle, MD as PCP - General (Internal Medicine) Inocencio Soyla Lunger, MD as PCP -  Electrophysiology (Cardiology) Shlomo Wilbert SAUNDERS, MD as PCP - Sleep Medicine (Cardiology) Bensimhon, Toribio SAUNDERS, MD as PCP - Cardiology (Cardiology) Cathern Andriette DEL, LCSW as Social Worker (Licensed Clinical Social Worker) Shamleffer, Donell Cardinal, MD as Consulting Physician (Endocrinology)  Patient Active Problem List   Diagnosis Date Noted   Chronic systolic heart failure (HCC) 05/08/2022   Type 2 diabetes mellitus with stage 3a chronic kidney disease, with long-term current use of insulin  (HCC) 02/16/2022   Prosthetic joint implant failure, initial encounter 12/20/2021   Pseudophakia of both eyes 11/15/2021   DVT (deep venous thrombosis) (HCC) 07/31/2021   Acute blood loss anemia    Septic Arthritis, Infection of prosthetic right knee joint (HCC), MSSA infection 07/10/2021   Complication of internal right knee prosthesis 07/10/2021   CAD (coronary artery disease) 06/08/2021   Carotid artery disease 06/08/2021   Symptomatic bradycardia 06/08/2021   Paroxysmal atrial fibrillation (HCC) 06/08/2021   CKD (chronic kidney disease), stage III (HCC) 06/08/2021   Polyneuropathy associated with underlying disease 05/04/2020   Severe nonproliferative diabetic retinopathy of left eye, with macular edema, associated with type 2 diabetes mellitus (HCC) 11/05/2019   Severe nonproliferative diabetic retinopathy of right eye, with macular edema, associated with type 2 diabetes mellitus (HCC) 11/05/2019   Retinal hemorrhage of right eye 11/05/2019   Retinal  hemorrhage of left eye 11/05/2019   Diabetes mellitus (HCC) 05/14/2019   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, with long-term current use of insulin  (HCC) 05/14/2019   Type 2 diabetes mellitus with diabetic polyneuropathy, with long-term current use of insulin  (HCC) 05/14/2019   History of gout 05/20/2018   Primary localized osteoarthritis of right knee 05/13/2018   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, Disp: 180 tablet, Rfl: 3   atorvastatin  (LIPITOR ) 80 MG tablet, Take 1 tablet (80 mg total) by mouth at bedtime., Disp: 100 tablet, Rfl: 3   B Complex-Biotin -FA (B-COMPLEX PO), Take 1 tablet by mouth daily., Disp: , Rfl:    Biotin  10 MG TABS, Take 10 mg by mouth daily., Disp: , Rfl:    Blood Glucose Monitoring Suppl (ONETOUCH VERIO) w/Device KIT, Use to check blood sugar, Disp: 1 kit, Rfl: 0   carvedilol  (COREG ) 3.125 MG tablet, Take 1 tablet (3.125 mg total) by mouth 2 (two) times daily with a meal., Disp: 180 tablet, Rfl: 3   cetirizine (ZYRTEC) 10 MG tablet, Take 10 mg by mouth daily., Disp: , Rfl:    Cinnamon 500 MG capsule, Take 2,000 mg by mouth daily., Disp: , Rfl:    clotrimazole-betamethasone (LOTRISONE) lotion, Apply 1 application topically 2 (two) times daily as needed (itching)., Disp: , Rfl:    Continuous Glucose Sensor (DEXCOM G6 SENSOR) MISC, 1 Device by Does not apply route as directed., Disp: 9 each, Rfl: 3   Continuous Glucose Transmitter (DEXCOM G6 TRANSMITTER) MISC, Change every 90 days, Disp: 1 each, Rfl: 3   empagliflozin  (JARDIANCE ) 10 MG TABS tablet, Take 1 tablet (10 mg total) by mouth daily., Disp: 90 tablet, Rfl: 3   ferrous sulfate  325 (65 FE) MG tablet, Take 1 tablet (325 mg total) by mouth 3 (three) times a week., Disp: 45 tablet, Rfl: 3   folic acid  (FOLVITE ) 1 MG tablet, Take 1 tablet (1 mg total) by  mouth daily., Disp: 90 tablet, Rfl: 3   gabapentin  (NEURONTIN ) 300 MG capsule, Take 1 capsule (300 mg total) by mouth 2 (two) times daily. (Patient not taking: Reported on 03/26/2024), Disp: 60 capsule, Rfl: 2   gabapentin  (NEURONTIN ) 300 MG capsule, Take 2 capsules (600 mg total) by mouth 2 (two) times daily., Disp: 360 capsule, Rfl: 3   glucose blood (ONETOUCH VERIO) test strip, Check blood sugar 2 times daily, Disp: 100 each, Rfl: 12   insulin  aspart (NOVOLOG  FLEXPEN) 100 UNIT/ML FlexPen, DIAL  AND INJECT UNDER THE SKIN THREE TIMES DAILY PER CORRECTION SCALE. MAX DAILY DOSE 50 UNITS, Disp: 15 mL, Rfl: 0   Insulin  Glargine (BASAGLAR  KWIKPEN) 100 UNIT/ML, Inject 40 Units into the skin at bedtime. (Patient taking differently: Inject 50 Units into the skin at bedtime.), Disp: 45 mL, Rfl: 2   Insulin   Pen Needle 32G X 4 MM MISC, 1 Device by Does not apply route in the morning, at noon, in the evening, and at bedtime., Disp: 400 each, Rfl: 3   Lancets (ONETOUCH DELICA PLUS LANCET33G) MISC, USE   TO CHECK GLUCOSE 4 TIMES DAILY, Disp: 100 each, Rfl: 3   latanoprost  (XALATAN ) 0.005 % ophthalmic solution, Place 1 drop into both eyes at bedtime., Disp: , Rfl:    levothyroxine  (SYNTHROID , LEVOTHROID) 75 MCG tablet, Take 75 mcg by mouth daily before breakfast., Disp: , Rfl:    LUMIGAN 0.01 % SOLN, Place 1 drop into both eyes at bedtime. (Patient not taking: Reported on 03/26/2024), Disp: , Rfl:    Multiple Vitamins-Minerals (ALIVE ONCE DAILY WOMENS 50+ PO), Take 1 tablet by mouth daily., Disp: , Rfl:    Omega-3 Fatty Acids (FISH OIL) 1200 MG CAPS, Take 1,200 mg by mouth at bedtime. Taking 2 capsules at night, Disp: , Rfl:    ondansetron  (ZOFRAN ) 4 MG tablet, Take 1 tablet (4 mg total) by mouth every 8 (eight) hours as needed for nausea or vomiting. (Patient not taking: Reported on 03/19/2024), Disp: 20 tablet, Rfl: 0   spironolactone  (ALDACTONE ) 25 MG tablet, Take 1 tablet (25 mg total) by mouth daily., Disp: 90  tablet, Rfl: 3   torsemide  (DEMADEX ) 20 MG tablet, Take 3 tablets (60 mg total) by mouth daily., Disp: 90 tablet, Rfl: 3   Travoprost, BAK Free, (TRAVATAN) 0.004 % SOLN ophthalmic solution, Place 1 drop into both eyes at bedtime. (Patient not taking: Reported on 04/02/2024), Disp: , Rfl:  Allergies  Allergen Reactions   Codeine Other (See Comments)   Other Other (See Comments)    Blood Product Refusal (Jehovah's witness)     Tape Other (See Comments)    Leaves blisters and marks on the skin   Sulfa Antibiotics Rash   Uloric  [Febuxostat ] Rash     Social History   Socioeconomic History   Marital status: Widowed    Spouse name: Not on file   Number of children: 3   Years of education: Not on file   Highest education level: Some college, no degree  Occupational History   Occupation: retired  Tobacco Use   Smoking status: Former    Current packs/day: 0.00    Average packs/day: 1 pack/day for 22.0 years (22.0 ttl pk-yrs)    Types: Cigarettes    Start date: 55    Quit date: 1983    Years since quitting: 42.9   Smokeless tobacco: Never  Vaping Use   Vaping status: Never Used  Substance and Sexual Activity   Alcohol use: Not Currently    Comment: 05/22/2018 glass of wine couple times/wk   Drug use: Not Currently   Sexual activity: Not Currently    Birth control/protection: None, Post-menopausal  Other Topics Concern   Not on file  Social History Narrative   Not on file   Social Drivers of Health   Financial Resource Strain: Medium Risk (12/22/2021)   Overall Financial Resource Strain (CARDIA)    Difficulty of Paying Living Expenses: Somewhat hard  Food Insecurity: No Food Insecurity (05/08/2018)   Hunger Vital Sign    Worried About Running Out of Food in the Last Year: Never true    Ran Out of Food in the Last Year: Never true  Transportation Needs: Unmet Transportation Needs (05/08/2022)   PRAPARE - Administrator, Civil Service (Medical): Yes    Lack  of Transportation (Non-Medical): Yes  Physical  Activity: Not on file  Stress: Not on file  Social Connections: Not on file  Intimate Partner Violence: Not on file    Physical Exam      Future Appointments  Date Time Provider Department Center  07/14/2024  7:05 AM [facility] HVT DEVICE REMOTES [facility]  10/13/2024  7:05 AM [facility] DEVICE REMOTES [facility]  01/12/2025  7:05 AM [facility] DEVICE REMOTES [facility]  04/13/2025  7:05 AM [facility] DEVICE REMOTES [facility]  07/13/2025  7:05 AM [facility] DEVICE REMOTES [facility]

## 2024-04-27 ENCOUNTER — Telehealth (HOSPITAL_COMMUNITY): Payer: Self-pay

## 2024-04-27 DIAGNOSIS — L299 Pruritus, unspecified: Secondary | ICD-10-CM | POA: Diagnosis not present

## 2024-04-27 DIAGNOSIS — H608X3 Other otitis externa, bilateral: Secondary | ICD-10-CM | POA: Diagnosis not present

## 2024-04-27 NOTE — Telephone Encounter (Signed)
 Called to confirm/remind patient of their appointment [facility] on 04/28/24 11:00.   Appointment:   [x] Confirmed  [] Left mess   [] No answer/No voice mail  [] VM Full/unable to leave message  [] Phone not in service  Patient reminded to bring all medications and/or complete list.  Confirmed patient has transportation. Gave directions, instructed to utilize valet parking.

## 2024-04-27 NOTE — Telephone Encounter (Signed)
 Called to confirm/remind patient of their appointment at [facility] on 04/28/24.   Appointment:   [x] Confirmed  [] Left mess   [] No answer/No voice mail  [] VM Full/unable to leave message  [] Phone not in service  Patient reminded to bring all medications and/or complete list.  Confirmed patient has transportation. Gave directions, instructed to utilize valet parking.

## 2024-04-27 NOTE — Progress Notes (Signed)
 [facility]   PCP: Rexanne Ingle, MD EP: Dr. Inocencio Rheumatology: Dr. Helena HF Cardiologist: Dr. Cherrie  HPI: Danielle [name] is a 78 y.o.female with a past medical history of systolic HF (EF 45-50%), CAD s/p multiple stents in [place] (with h/o LAD stent thrombosis), ischemic cardiomyopathy (EF 10% at one point), symptomatic bradycardia s/p PPM, HTN, DM, hypothyroidism, and OSA.   Admitted 9/18 with SOB, acute on chronic systolic CHF. Diuresed with IV lasix  (12 pounds), discharge weight was 215 pounds.    Had R TKR 12/19.    Admitted 11/22 with right knee septic arthritis. Arthrocentesis culture grew staph aureus.  Underwent I&D and polyexchange of infected right total knee arthroplasty.  Treated with 6 weeks IV antibiotics followed by suppressive abx. Aldactone  held, losartan  reduced to 25 mg daily and coreg  reduced to 12.5 mg BID d/t soft BP. She was given 1 dose of furosemide  on admission due to leg edema but this was held later due to AKI.  Torsemide  was resumed at discharge.  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  Patient underwent explant right total knee arthroplasty with implantation of articulating antibiotic spacer by Dr. Edna on 07/12/2021. Placed on IV abx. Was discharged to SNF. During hospitalization was very anemic, but improving.   Echo 2/23 EF 40-45%   Follow up 4/23, unsure what meds she was taking. Re-enrolled in paramedicine.   Had knee re-replaced 12/20/21  Echo 05/08/22: EF 45-50%, mildly decreased LV with moderate LVH, grade II DD, RV ok, mild MR with severe MAC.  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  Today she returns for AHF follow up. Overall feeling ok. Denies palpitations, CP, dizziness, or PND/Orthopnea. Occasional swelling in BLE. No SOB. Appetite poor but does prepare her own meals. No fever or chills. Weight at home 198-204 pounds. Taking all medications but sometimes forgets night time  doses. Denies tobacco or drug use. Occasionally has a mixed drink. Has been wearing CPAP at night, reports less day time sleepiness.   Cardiac Studies: - Echo 05/17/23, EF 40-45% - Echo (12/23): EF 45-50% - Echo (2/23): EF 40-45%  - Echo (12/21): EF 45-50% G2DD  - Echo (8/19): EF 45-50%, G2DD, LA mildly dilated, trileaflet moderately thick aortic valve - Echo (9/18): EF 45-50% Grade 2DD Left Atrium Severely dilated  ROS: All systems negative except as listed in HPI, PMH and Problem List.  SH:  Social History   Socioeconomic History   Marital status: Widowed    Spouse name: Not on file   Number of children: 3   Years of education: Not on file   Highest education level: Some college, no degree  Occupational History   Occupation: retired  Tobacco Use   Smoking status: Former    Current packs/day: 0.00    Average packs/day: 1 pack/day for 22.0 years (22.0 ttl pk-yrs)    Types: Cigarettes    Start date: 51    Quit date: 1983    Years since quitting: 42.9   Smokeless tobacco: Never  Vaping Use   Vaping status: Never Used  Substance and Sexual Activity   Alcohol use: Not Currently    Comment: 05/22/2018 glass of wine couple times/wk   Drug use: Not Currently   Sexual activity: Not Currently    Birth control/protection: None, Post-menopausal  Other Topics Concern   Not on file  Social History Narrative   Not on file   Social Drivers of Health   Financial Resource Strain:  Medium Risk (12/22/2021)   Overall Financial Resource Strain (CARDIA)    Difficulty of Paying Living Expenses: Somewhat hard  Food Insecurity: No Food Insecurity (05/08/2018)   Hunger Vital Sign    Worried About Running Out of Food in the Last Year: Never true    Ran Out of Food in the Last Year: Never true  Transportation Needs: Unmet Transportation Needs (05/08/2022)   PRAPARE - Administrator, Civil Service (Medical): Yes    Lack of Transportation (Non-Medical): Yes  Physical  Activity: Not on file  Stress: Not on file  Social Connections: Not on file  Intimate Partner Violence: Not on file   FH:  Family History  Problem Relation Age of Onset   Multiple myeloma Mother    Heart disease Father    Other Sister    Heart disease Brother    Diabetes Sister    Other Sister    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HCC) 02/05/2017   Acute gout due to renal impairment involving right wrist 05/20/2018   Acute kidney failure 05/15/2018   Arthritis    all over (05/22/2018)   Blood dyscrasia    per pt-has small blood cells-appears as if anemic   Breast cancer, left breast (HCC) 1985   CHF (congestive heart failure) (HCC)    Complication of anesthesia    difficult to awaken from per pt   Coronary artery disease    Dyspnea    Essential hypertension    Heart disease    Heart murmur    History of gout    Hypothyroidism    Obesity (BMI 30-39.9) 11/11/2017   OSA (obstructive sleep apnea) 02/18/2017    severe obstructive sleep apnea with an AHI of 75.6/h and mild central sleep apnea with a CAI of 7.7/h.  Oxygen saturations dropped as low as 82%.   He is on CPAP at 9 cm H2O.   OSA on CPAP    Personal history of chemotherapy    Personal history of radiation therapy    Presence of permanent cardiac pacemaker    Primary localized osteoarthritis of right knee 05/13/2018   Refusal of blood transfusions as patient is Jehovah's Witness    Type II diabetes mellitus (HCC)    Current Outpatient Medications  Medication Sig Dispense Refill   acetaminophen  (TYLENOL ) 500 MG tablet Take 500-1,000 mg by mouth every 6 (six) hours as needed (for pain or headaches).     acetic acid 2 % otic solution Place 4 drops into the left ear daily.     allopurinol  (ZYLOPRIM ) 100 MG tablet Take 100 mg by mouth daily.     apixaban  (ELIQUIS ) 5 MG TABS tablet Take 1 tablet (5 mg total) by mouth 2 (two) times daily. 180 tablet 3    atorvastatin  (LIPITOR ) 80 MG tablet Take 1 tablet (80 mg total) by mouth at bedtime. 100 tablet 3   B Complex-Biotin -FA (B-COMPLEX PO) Take 1 tablet by mouth daily.     Biotin  10 MG TABS Take 10 mg by mouth daily.     Blood Glucose Monitoring Suppl (ONETOUCH VERIO) w/Device KIT Use to check blood sugar 1 kit 0   carvedilol  (COREG ) 3.125 MG tablet Take 1 tablet (3.125 mg total) by mouth 2 (two) times daily with a meal. 180 tablet 3   cetirizine (ZYRTEC) 10 MG tablet Take 10 mg by mouth daily.     Cinnamon 500 MG capsule Take  2,000 mg by mouth daily.     clotrimazole-betamethasone (LOTRISONE) lotion Apply 1 application topically 2 (two) times daily as needed (itching).     Continuous Glucose Sensor (DEXCOM G6 SENSOR) MISC 1 Device by Does not apply route as directed. 9 each 3   Continuous Glucose Transmitter (DEXCOM G6 TRANSMITTER) MISC Change every 90 days 1 each 3   empagliflozin  (JARDIANCE ) 10 MG TABS tablet Take 1 tablet (10 mg total) by mouth daily. 90 tablet 3   ferrous sulfate  325 (65 FE) MG tablet Take 1 tablet (325 mg total) by mouth 3 (three) times a week. 45 tablet 3   folic acid  (FOLVITE ) 1 MG tablet Take 1 tablet (1 mg total) by mouth daily. 90 tablet 3   gabapentin  (NEURONTIN ) 300 MG capsule Take 1 capsule (300 mg total) by mouth 2 (two) times daily. 60 capsule 2   gabapentin  (NEURONTIN ) 300 MG capsule Take 2 capsules (600 mg total) by mouth 2 (two) times daily. 360 capsule 3   glucose blood (ONETOUCH VERIO) test strip Check blood sugar 2 times daily 100 each 12   insulin  aspart (NOVOLOG  FLEXPEN) 100 UNIT/ML FlexPen DIAL  AND INJECT UNDER THE SKIN THREE TIMES DAILY PER CORRECTION SCALE. MAX DAILY DOSE 50 UNITS 15 mL 0   Insulin  Glargine (BASAGLAR  KWIKPEN) 100 UNIT/ML Inject 40 Units into the skin at bedtime. (Patient taking differently: Inject 50 Units into the skin at bedtime.) 45 mL 2   Insulin  Pen Needle 32G X 4 MM MISC 1 Device by Does not apply route in the morning, at noon, in  the evening, and at bedtime. 400 each 3   Lancets (ONETOUCH DELICA PLUS LANCET33G) MISC USE   TO CHECK GLUCOSE 4 TIMES DAILY 100 each 3   latanoprost  (XALATAN ) 0.005 % ophthalmic solution Place 1 drop into both eyes at bedtime.     levothyroxine  (SYNTHROID , LEVOTHROID) 75 MCG tablet Take 75 mcg by mouth daily before breakfast.     LUMIGAN 0.01 % SOLN Place 1 drop into both eyes at bedtime.     Multiple Vitamins-Minerals (ALIVE ONCE DAILY WOMENS 50+ PO) Take 1 tablet by mouth daily.     Omega-3 Fatty Acids (FISH OIL) 1200 MG CAPS Take 1,200 mg by mouth at bedtime. Taking 2 capsules at night     ondansetron  (ZOFRAN ) 4 MG tablet Take 1 tablet (4 mg total) by mouth every 8 (eight) hours as needed for nausea or vomiting. 20 tablet 0   spironolactone  (ALDACTONE ) 25 MG tablet Take 1 tablet (25 mg total) by mouth daily. 90 tablet 3   torsemide  (DEMADEX ) 20 MG tablet Take 3 tablets (60 mg total) by mouth daily. 90 tablet 3   Travoprost, BAK Free, (TRAVATAN) 0.004 % SOLN ophthalmic solution Place 1 drop into both eyes at bedtime.     No current facility-administered medications for this encounter.   BP 116/72   Pulse 76   Ht 5' 4 (1.626 m)   Wt 96.1 kg (211 lb 12.8 oz)   SpO2 97%   BMI 36.36 kg/m   Wt Readings from Last 3 Encounters:  04/28/24 96.1 kg (211 lb 12.8 oz)  04/16/24 90.7 kg (200 lb)  04/02/24 90.1 kg (198 lb 9.6 oz)   PHYSICAL EXAM: General:  elderly appearing.  No respiratory difficulty. Arrived in [facility] [place]: JVD ~9 cm.  Cor: Regular rate & rhythm. No murmurs. Lungs: clear, coarse bases Extremities: +1-2 BLE edema  Neuro: alert & oriented x 3. Affect flat.   PPM interrogation (  personally reviewed): 0.8 hr/day activity, < 0.1 hr/day in AF, no pace-terminated pauses, 95.4% AS-VS   ASSESSMENT & PLAN: 1. Chronic systolic CHF  - ICM. - Echo (12/21): EF 45-50% G2DD  - Echo (2/23): EF 40-45%  - Echo (12/23): EF 45-50%, moderate LVH grade II DD, RV normal, severe MAC.  - Echo  today 05/17/23: EF 40-45%, RV ok, mild to moderate MR - NYHA IIIa, limited by MSK pain. Weight is up and BLE edema.  - Continue compression hose. - Continue spironolactone  25 mg daily.  - Continue Torsemide  60 mg daily. Recommended increasing torsemide  for a couple of days but she refuses to go higher despite clear fluid on board. States she's too afraid of messing up her kidneys. BMET/BNP today. Does not take PRN doses.  - Continue carvedilol  3.125 mg bid. - Continue Jardiance  10 mg daily. Denies GU symptoms - Continue Paramedicine support  2. CAD  - Stenting in 2010, 2012 and 2016.  - No chest pain. - Continue statin.  - Off ASA with Eliquis . CBC today   3. Paroxysmal Afib - Regular on exam - Continue Eliquis  5 mg bid.  - (She is Jehovah's Witness).   4. HTN - BP stable today - No change to meds today.   5. DM - Per Endocrine. Last A1c 10.2. PCP to update soon.  - On insulin . - Continue Jardiance . Could increase Jardiance  if Endocrinology recommends.    6. H/o sinus pauses s/p PPM - Dr. Inocencio now following.    7. OSA - Continue CPAP nightly.  - Follows with Dr. Shlomo. Last saw 9/25. Reports compliance with CPAP.    8. CKD 3b - Baseline SCr ~ 1.5-2.0 - Last Scr 1.57 (9/25) - Continue Jardiance .   9. 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   Followed by paramedicine, compliance has improved.   Follow up in 3 months with APP.   Beckey LITTIE Coe, NP  11:32 AM

## 2024-04-28 ENCOUNTER — Ambulatory Visit (HOSPITAL_COMMUNITY): Payer: Self-pay | Admitting: Internal Medicine

## 2024-04-28 ENCOUNTER — Encounter (HOSPITAL_COMMUNITY): Payer: Self-pay

## 2024-04-28 ENCOUNTER — Ambulatory Visit

## 2024-04-28 ENCOUNTER — Ambulatory Visit (HOSPITAL_COMMUNITY)
Admission: RE | Admit: 2024-04-28 | Discharge: 2024-04-28 | Disposition: A | Discharge status: Home / Self Care | Source: Ambulatory Visit | Attending: Internal Medicine | Admitting: Internal Medicine

## 2024-04-28 VITALS — BP 116/72 | HR 76 | Ht 64.0 in | Wt 211.8 lb

## 2024-04-28 DIAGNOSIS — I130 Hypertensive heart and chronic kidney disease with heart failure and stage 1 through stage 4 chronic kidney disease, or unspecified chronic kidney disease: Secondary | ICD-10-CM | POA: Diagnosis not present

## 2024-04-28 DIAGNOSIS — I10 Essential (primary) hypertension: Secondary | ICD-10-CM | POA: Diagnosis not present

## 2024-04-28 DIAGNOSIS — I495 Sick sinus syndrome: Secondary | ICD-10-CM

## 2024-04-28 DIAGNOSIS — Z79899 Other long term (current) drug therapy: Secondary | ICD-10-CM | POA: Insufficient documentation

## 2024-04-28 DIAGNOSIS — E1122 Type 2 diabetes mellitus with diabetic chronic kidney disease: Secondary | ICD-10-CM | POA: Diagnosis not present

## 2024-04-28 DIAGNOSIS — I2510 Atherosclerotic heart disease of native coronary artery without angina pectoris: Secondary | ICD-10-CM | POA: Diagnosis not present

## 2024-04-28 DIAGNOSIS — M7918 Myalgia, other site: Secondary | ICD-10-CM

## 2024-04-28 DIAGNOSIS — G4733 Obstructive sleep apnea (adult) (pediatric): Secondary | ICD-10-CM | POA: Insufficient documentation

## 2024-04-28 DIAGNOSIS — E039 Hypothyroidism, unspecified: Secondary | ICD-10-CM | POA: Insufficient documentation

## 2024-04-28 DIAGNOSIS — Z955 Presence of coronary angioplasty implant and graft: Secondary | ICD-10-CM | POA: Insufficient documentation

## 2024-04-28 DIAGNOSIS — Z87891 Personal history of nicotine dependence: Secondary | ICD-10-CM | POA: Insufficient documentation

## 2024-04-28 DIAGNOSIS — N183 Chronic kidney disease, stage 3 unspecified: Secondary | ICD-10-CM

## 2024-04-28 DIAGNOSIS — Z950 Presence of cardiac pacemaker: Secondary | ICD-10-CM | POA: Insufficient documentation

## 2024-04-28 DIAGNOSIS — Z7984 Long term (current) use of oral hypoglycemic drugs: Secondary | ICD-10-CM | POA: Insufficient documentation

## 2024-04-28 DIAGNOSIS — N1832 Chronic kidney disease, stage 3b: Secondary | ICD-10-CM | POA: Insufficient documentation

## 2024-04-28 DIAGNOSIS — I480 Paroxysmal atrial fibrillation: Secondary | ICD-10-CM | POA: Insufficient documentation

## 2024-04-28 DIAGNOSIS — Z794 Long term (current) use of insulin: Secondary | ICD-10-CM | POA: Diagnosis not present

## 2024-04-28 DIAGNOSIS — I255 Ischemic cardiomyopathy: Secondary | ICD-10-CM | POA: Insufficient documentation

## 2024-04-28 DIAGNOSIS — I5022 Chronic systolic (congestive) heart failure: Secondary | ICD-10-CM | POA: Insufficient documentation

## 2024-04-28 DIAGNOSIS — Z7901 Long term (current) use of anticoagulants: Secondary | ICD-10-CM | POA: Diagnosis not present

## 2024-04-28 LAB — BASIC METABOLIC PANEL WITH GFR
Anion gap: 9 (ref 5–15)
BUN: 36 mg/dL — ABNORMAL HIGH (ref 8–23)
CO2: 26 mmol/L (ref 22–32)
Calcium: 9 mg/dL (ref 8.9–10.3)
Chloride: 103 mmol/L (ref 98–111)
Creatinine, Ser: 1.44 mg/dL — ABNORMAL HIGH (ref 0.44–1.00)
GFR, Estimated: 37 mL/min — ABNORMAL LOW (ref >60)
Glucose, Bld: 237 mg/dL — ABNORMAL HIGH (ref 70–99)
Potassium: 4.2 mmol/L (ref 3.5–5.1)
Sodium: 138 mmol/L (ref 135–145)

## 2024-04-28 LAB — CBC
HCT: 35.9 % — ABNORMAL LOW (ref 36.0–46.0)
Hemoglobin: 10.7 g/dL — ABNORMAL LOW (ref 12.0–15.0)
MCH: 20.3 pg — ABNORMAL LOW (ref 26.0–34.0)
MCHC: 29.8 g/dL — ABNORMAL LOW (ref 30.0–36.0)
MCV: 68 fL — ABNORMAL LOW (ref 80.0–100.0)
Platelets: 205 K/uL (ref 150–400)
RBC: 5.28 MIL/uL — ABNORMAL HIGH (ref 3.87–5.11)
RDW: 16 % — ABNORMAL HIGH (ref 11.5–15.5)
WBC: 6.9 K/uL (ref 4.0–10.5)
nRBC: 0 % (ref 0.0–0.2)

## 2024-04-28 LAB — BRAIN NATRIURETIC PEPTIDE: B Natriuretic Peptide: 182.2 pg/mL — ABNORMAL HIGH (ref 0.0–100.0)

## 2024-04-28 NOTE — Patient Instructions (Addendum)
 Good to see you today!   Labs done today, your results will be available in MyChart, we will contact you for abnormal readings.    Your physician recommends that you schedule a follow-up appointment 3 months as scheduled  If you have any questions or concerns before your next appointment please send us  a message through [place] or call our office at [phone].    TO LEAVE A MESSAGE FOR THE NURSE SELECT OPTION 2, PLEASE LEAVE A MESSAGE INCLUDING: YOUR NAME DATE OF BIRTH CALL BACK NUMBER REASON FOR CALL**this is important as we prioritize the call backs  YOU WILL RECEIVE A CALL BACK THE SAME DAY AS LONG AS YOU CALL BEFORE 4:00 PM At the [facility], you and your health needs are our priority. As part of our continuing mission to provide you with exceptional heart care, we have created designated Provider Care Teams. These Care Teams include your primary Cardiologist (physician) and Advanced Practice Providers (APPs- Physician Assistants and Nurse Practitioners) who all work together to provide you with the care you need, when you need it.   You may see any of the following providers on your designated Care Team at your next follow up: Dr Toribio Fuel Dr Ezra Shuck Dr. Morene Brownie Greig Mosses, NP Caffie Shed, [place] [facility] [place], [place] Beckey Coe, NP Jordan Lee, NP Ellouise Class, NP Tinnie Redman, PharmD Jaun Bash, PharmD   Please be sure to bring in all your medications bottles to every appointment.    Thank you for choosing  [facility]

## 2024-04-29 LAB — CUP PACEART REMOTE DEVICE CHECK
Battery Remaining Longevity: 22 mo
Battery Voltage: 2.91 V
Brady Statistic AP VP Percent: 0.02 %
Brady Statistic AP VS Percent: 4.5 %
Brady Statistic AS VP Percent: 0.07 %
Brady Statistic AS VS Percent: 95.42 %
Brady Statistic RA Percent Paced: 4.48 %
Brady Statistic RV Percent Paced: 0.09 %
Date Time Interrogation Session: 20251202131017
Implantable Lead Connection Status: 753985
Implantable Lead Connection Status: 753985
Implantable Lead Implant Date: 20160222
Implantable Lead Implant Date: 20160222
Implantable Lead Location: 753859
Implantable Lead Location: 753860
Implantable Lead Model: 5076
Implantable Lead Model: 5076
Implantable Pulse Generator Implant Date: 20160222
Lead Channel Impedance Value: 380 Ohm
Lead Channel Impedance Value: 399 Ohm
Lead Channel Impedance Value: 418 Ohm
Lead Channel Impedance Value: 475 Ohm
Lead Channel Pacing Threshold Amplitude: 0.75 V
Lead Channel Pacing Threshold Amplitude: 0.75 V
Lead Channel Pacing Threshold Pulse Width: 0.4 ms
Lead Channel Pacing Threshold Pulse Width: 0.4 ms
Lead Channel Sensing Intrinsic Amplitude: 18.25 mV
Lead Channel Sensing Intrinsic Amplitude: 18.25 mV
Lead Channel Sensing Intrinsic Amplitude: 4.875 mV
Lead Channel Sensing Intrinsic Amplitude: 4.875 mV
Lead Channel Setting Pacing Amplitude: 2 V
Lead Channel Setting Pacing Amplitude: 2 V
Lead Channel Setting Pacing Pulse Width: 0.4 ms
Lead Channel Setting Sensing Sensitivity: 2 mV
Zone Setting Status: 755011
Zone Setting Status: 755011

## 2024-04-30 ENCOUNTER — Other Ambulatory Visit (HOSPITAL_COMMUNITY): Payer: Self-pay

## 2024-04-30 ENCOUNTER — Ambulatory Visit: Payer: Self-pay | Admitting: Cardiology

## 2024-04-30 NOTE — Progress Notes (Signed)
 Paramedicine Encounter    Patient ID: Danielle [name], female    DOB: 04-Jun-1945, 78 y.o.   MRN: [id number]   Complaints- trouble with walker/rollator, lower leg swelling with weight gain   Assessment- CAOX4, warm and dry seated in her living room- says she has been feeling okay but says her weight was up this morning and her lower legs are swelling but is afraid to take any more diuretics. I spent time educating her and she agreed to take an extra 20mg  for weight gain as needed. She has missed several doses over the last two weeks.   Compliance with meds- missed several doses over the last few weeks   Pill box filled- for one week    Refills needed- torsemide    Meds changes since last visit- none     Social changes- got her a new walker at [employer] today    VISIT SUMMARY- Arrived for home visit for [facility] who reports to be feeling okay other than some recent weight gain and lower leg swelling. We spent a lot of time today discussing reasons for this and talked about her diet and fluid restrictions as well as the importance of using the extra torsemide  for fluid gain/retention. She agreed and understood. Meds were reviewed and pill box filled for one week.  Contacted [employer] and got a new walker lined up- picked up and delivered to her this afternoon.  Home visit complete. I will see Celene in one week.   Wt 210 lb (95.3 kg)   BMI 36.05 kg/m  Weight yesterday-- 211lbs  Last visit weight-- 211lbs      ACTION: Home visit completed     Patient Care Team: Rexanne Ingle, MD as PCP - General (Internal Medicine) Inocencio Soyla Lunger, MD as PCP - Electrophysiology (Cardiology) Shlomo Wilbert SAUNDERS, MD as PCP - Sleep Medicine (Cardiology) Bensimhon, Toribio SAUNDERS, MD as PCP - Cardiology (Cardiology) Cathern Andriette DEL, LCSW as Social Worker (Licensed Clinical Social Worker) Shamleffer, Donell Cardinal, MD as Consulting Physician (Endocrinology)  Patient Active Problem List    Diagnosis Date Noted   Chronic systolic heart failure (HCC) 05/08/2022   Type 2 diabetes mellitus with stage 3a chronic kidney disease, with long-term current use of insulin  (HCC) 02/16/2022   Prosthetic joint implant failure, initial encounter 12/20/2021   Pseudophakia of both eyes 11/15/2021   DVT (deep venous thrombosis) (HCC) 07/31/2021   Acute blood loss anemia    Septic Arthritis, Infection of prosthetic right knee joint (HCC), MSSA infection 07/10/2021   Complication of internal right knee prosthesis 07/10/2021   CAD (coronary artery disease) 06/08/2021   Carotid artery disease 06/08/2021   Symptomatic bradycardia 06/08/2021   Paroxysmal atrial fibrillation (HCC) 06/08/2021   CKD (chronic kidney disease), stage III (HCC) 06/08/2021   Polyneuropathy associated with underlying disease 05/04/2020   Severe nonproliferative diabetic retinopathy of left eye, with macular edema, associated with type 2 diabetes mellitus (HCC) 11/05/2019   Severe nonproliferative diabetic retinopathy of right eye, with macular edema, associated with type 2 diabetes mellitus (HCC) 11/05/2019   Retinal hemorrhage of right eye 11/05/2019   Retinal hemorrhage of left eye 11/05/2019   Diabetes mellitus (HCC) 05/14/2019   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, with long-term current use of insulin  (HCC) 05/14/2019   Type 2 diabetes mellitus with diabetic polyneuropathy, with long-term current use of insulin  (HCC) 05/14/2019   History of gout 05/20/2018   Primary localized osteoarthritis of right knee 05/13/2018   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, Disp: 180 tablet, Rfl: 3   atorvastatin  (LIPITOR ) 80 MG tablet, Take 1 tablet (80 mg total) by mouth at bedtime., Disp: 100 tablet, Rfl: 3   B Complex-Biotin -FA (B-COMPLEX PO), Take 1 tablet by mouth daily., Disp: , Rfl:    Biotin  10 MG TABS, Take 10 mg by mouth daily., Disp: , Rfl:    Blood Glucose Monitoring Suppl (ONETOUCH VERIO) w/Device KIT, Use to check blood sugar, Disp: 1 kit, Rfl: 0   carvedilol  (COREG ) 3.125 MG tablet, Take 1 tablet (3.125 mg total) by mouth 2 (two) times daily with a meal., Disp: 180 tablet, Rfl: 3   cetirizine (ZYRTEC) 10 MG tablet, Take 10 mg by mouth daily., Disp: , Rfl:    Cinnamon 500 MG capsule, Take 2,000 mg by mouth daily., Disp: , Rfl:    clotrimazole-betamethasone (LOTRISONE) lotion, Apply 1 application topically 2 (two) times daily as needed (itching)., Disp: , Rfl:    Continuous Glucose Sensor (DEXCOM G6 SENSOR) MISC, 1 Device by Does not apply route as directed., Disp: 9 each, Rfl: 3   Continuous Glucose Transmitter (DEXCOM G6 TRANSMITTER) MISC, Change every 90 days, Disp: 1 each, Rfl: 3   empagliflozin  (JARDIANCE ) 10 MG TABS tablet, Take 1 tablet (10 mg total) by mouth daily., Disp: 90 tablet, Rfl: 3   ferrous sulfate  325 (65 FE) MG tablet, Take 1 tablet (325 mg total) by mouth 3 (three) times a week., Disp: 45 tablet, Rfl: 3   folic acid  (FOLVITE ) 1 MG tablet, Take 1 tablet (1 mg total) by mouth daily., Disp: 90 tablet, Rfl: 3   gabapentin  (NEURONTIN ) 300 MG capsule, Take 1 capsule (300 mg total) by mouth 2 (two) times daily., Disp: 60 capsule, Rfl: 2   gabapentin  (NEURONTIN ) 300 MG capsule, Take 2 capsules (600 mg total) by mouth 2 (two) times daily., Disp: 360 capsule, Rfl: 3   glucose blood (ONETOUCH  VERIO) test strip, Check blood sugar 2 times daily, Disp: 100 each, Rfl: 12   insulin  aspart (NOVOLOG  FLEXPEN) 100 UNIT/ML FlexPen, DIAL  AND INJECT UNDER THE SKIN THREE TIMES DAILY PER CORRECTION SCALE. MAX DAILY DOSE 50 UNITS, Disp: 15 mL, Rfl: 0   Insulin  Glargine (BASAGLAR  KWIKPEN) 100 UNIT/ML, Inject 40 Units into the skin at bedtime. (Patient taking differently: Inject 50 Units into the skin at bedtime.), Disp: 45 mL, Rfl: 2   Insulin  Pen Needle 32G X 4 MM MISC, 1 Device by Does not apply route in the morning, at noon, in the evening, and at bedtime., Disp: 400 each, Rfl: 3   Lancets (ONETOUCH DELICA PLUS LANCET33G) MISC, USE   TO CHECK GLUCOSE 4 TIMES DAILY, Disp: 100 each, Rfl: 3   latanoprost  (XALATAN ) 0.005 % ophthalmic solution, Place 1 drop into both eyes at bedtime., Disp: , Rfl:  levothyroxine  (SYNTHROID , LEVOTHROID) 75 MCG tablet, Take 75 mcg by mouth daily before breakfast., Disp: , Rfl:    LUMIGAN 0.01 % SOLN, Place 1 drop into both eyes at bedtime., Disp: , Rfl:    Multiple Vitamins-Minerals (ALIVE ONCE DAILY WOMENS 50+ PO), Take 1 tablet by mouth daily., Disp: , Rfl:    Omega-3 Fatty Acids (FISH OIL) 1200 MG CAPS, Take 1,200 mg by mouth at bedtime. Taking 2 capsules at night, Disp: , Rfl:    ondansetron  (ZOFRAN ) 4 MG tablet, Take 1 tablet (4 mg total) by mouth every 8 (eight) hours as needed for nausea or vomiting., Disp: 20 tablet, Rfl: 0   spironolactone  (ALDACTONE ) 25 MG tablet, Take 1 tablet (25 mg total) by mouth daily., Disp: 90 tablet, Rfl: 3   torsemide  (DEMADEX ) 20 MG tablet, Take 3 tablets (60 mg total) by mouth daily., Disp: 90 tablet, Rfl: 3   Travoprost, BAK Free, (TRAVATAN) 0.004 % SOLN ophthalmic solution, Place 1 drop into both eyes at bedtime., Disp: , Rfl:  Allergies  Allergen Reactions   Codeine Other (See Comments)   Other Other (See Comments)    Blood Product Refusal (Jehovah's witness)     Tape Other (See Comments)    Leaves blisters and marks on the  skin   Sulfa Antibiotics Rash   Uloric  [Febuxostat ] Rash     Social History   Socioeconomic History   Marital status: Widowed    Spouse name: Not on file   Number of children: 3   Years of education: Not on file   Highest education level: Some college, no degree  Occupational History   Occupation: retired  Tobacco Use   Smoking status: Former    Current packs/day: 0.00    Average packs/day: 1 pack/day for 22.0 years (22.0 ttl pk-yrs)    Types: Cigarettes    Start date: 47    Quit date: 53    Years since quitting: 42.9   Smokeless tobacco: Never  Vaping Use   Vaping status: Never Used  Substance and Sexual Activity   Alcohol use: Not Currently    Comment: 05/22/2018 glass of wine couple times/wk   Drug use: Not Currently   Sexual activity: Not Currently    Birth control/protection: None, Post-menopausal  Other Topics Concern   Not on file  Social History Narrative   Not on file   Social Drivers of Health   Financial Resource Strain: Medium Risk (12/22/2021)   Overall Financial Resource Strain (CARDIA)    Difficulty of Paying Living Expenses: Somewhat hard  Food Insecurity: No Food Insecurity (05/08/2018)   Hunger Vital Sign    Worried About Running Out of Food in the Last Year: Never true    Ran Out of Food in the Last Year: Never true  Transportation Needs: Unmet Transportation Needs (05/08/2022)   PRAPARE - Administrator, Civil Service (Medical): Yes    Lack of Transportation (Non-Medical): Yes  Physical Activity: Not on file  Stress: Not on file  Social Connections: Not on file  Intimate Partner Violence: Not on file    Physical Exam      Future Appointments  Date Time Provider Department Center  07/27/2024 11:30 AM [facility] PA/NP [facility] None  07/28/2024  7:15 AM [facility] DEVICE REMOTES [facility]  10/27/2024  7:15 AM [facility] DEVICE REMOTES [facility]  01/26/2025  7:15 AM [facility] DEVICE REMOTES [facility]  04/27/2025  7:15 AM [facility] DEVICE REMOTES [facility]

## 2024-05-01 NOTE — Progress Notes (Signed)
 Remote PPM Transmission

## 2024-05-05 ENCOUNTER — Ambulatory Visit (HOSPITAL_COMMUNITY)

## 2024-05-05 DIAGNOSIS — G4733 Obstructive sleep apnea (adult) (pediatric): Secondary | ICD-10-CM | POA: Diagnosis not present

## 2024-05-05 DIAGNOSIS — I10 Essential (primary) hypertension: Secondary | ICD-10-CM | POA: Diagnosis not present

## 2024-05-07 ENCOUNTER — Other Ambulatory Visit (HOSPITAL_COMMUNITY): Payer: Self-pay | Admitting: Adult Health

## 2024-05-07 ENCOUNTER — Other Ambulatory Visit (HOSPITAL_COMMUNITY): Payer: Self-pay

## 2024-05-07 DIAGNOSIS — I5022 Chronic systolic (congestive) heart failure: Secondary | ICD-10-CM

## 2024-05-07 NOTE — Progress Notes (Signed)
 Paramedicine Encounter    Patient ID: Danielle [name], female    DOB: July 03, 1945, 78 y.o.   MRN: [id number]   Complaints- sinus congestion    Assessment- CAOX4, warm and dry ambulatory with no swelling, no chest pain, no dizziness, lungs clear, has some sinus congestion, weight down 8 lbs   Compliance with meds- missed two torsemide  yesterday   Pill box filled- for two weeks   Refills needed- torsemide , freestyle sensors, allopurinol , pen needle   Meds changes since last visit- none     Social changes- none   CBG- 315- 30 units novolog  taken at 1330.    VISIT SUMMARY- Arrived for home visit for [facility] who reports to be feeling well with no complaints other than some sinus congestion. She states she has no shortness of breath, no dizziness, no swelling. Weight down 8lbs this week. She has been mostly med complaint. I obtained vitals and assessment. Meds reviewed and confirmed. Pill box filled for two weeks as she is going out of town on Sunday for a week. I will plan to follow up on 12/22. She agreed. Diabetic management education given and discussed at length. Freestyle sensor replaced and insulin  given after CBG reviewed. Home visit complete.   BP 110/76   Pulse 65   Resp 16   Wt 202 lb (91.6 kg)   SpO2 97%   BMI 34.67 kg/m  Weight yesterday-- didn't weigh  Last visit weight-- 210lbs      ACTION: Home visit completed     Patient Care Team: Rexanne Ingle, MD as PCP - General (Internal Medicine) Inocencio Soyla Lunger, MD as PCP - Electrophysiology (Cardiology) Shlomo Wilbert SAUNDERS, MD as PCP - Sleep Medicine (Cardiology) Bensimhon, Toribio SAUNDERS, MD as PCP - Cardiology (Cardiology) Cathern Andriette DEL, LCSW as Social Worker (Licensed Clinical Social Worker) Shamleffer, Donell Cardinal, MD as Consulting Physician (Endocrinology)  Patient Active Problem List   Diagnosis Date Noted   Chronic systolic heart failure (HCC) 05/08/2022   Type 2 diabetes mellitus with stage 3a chronic  kidney disease, with long-term current use of insulin  (HCC) 02/16/2022   Prosthetic joint implant failure, initial encounter 12/20/2021   Pseudophakia of both eyes 11/15/2021   DVT (deep venous thrombosis) (HCC) 07/31/2021   Acute blood loss anemia    Septic Arthritis, Infection of prosthetic right knee joint (HCC), MSSA infection 07/10/2021   Complication of internal right knee prosthesis 07/10/2021   CAD (coronary artery disease) 06/08/2021   Carotid artery disease 06/08/2021   Symptomatic bradycardia 06/08/2021   Paroxysmal atrial fibrillation (HCC) 06/08/2021   CKD (chronic kidney disease), stage III (HCC) 06/08/2021   Polyneuropathy associated with underlying disease 05/04/2020   Severe nonproliferative diabetic retinopathy of left eye, with macular edema, associated with type 2 diabetes mellitus (HCC) 11/05/2019   Severe nonproliferative diabetic retinopathy of right eye, with macular edema, associated with type 2 diabetes mellitus (HCC) 11/05/2019   Retinal hemorrhage of right eye 11/05/2019   Retinal hemorrhage of left eye 11/05/2019   Diabetes mellitus (HCC) 05/14/2019   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, with long-term current use of insulin  (HCC) 05/14/2019   Type 2 diabetes mellitus with diabetic polyneuropathy, with long-term current use of insulin  (HCC) 05/14/2019   History of gout 05/20/2018   Primary localized osteoarthritis of right knee 05/13/2018   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) 12/20/2017   Obesity (BMI 30-39.9) 11/11/2017   OSA (obstructive sleep apnea) 02/18/2017   Essential hypertension    L Breast Cancer  Hypothyroidism    Current Medications[1] Allergies[2]   Social History   Socioeconomic History   Marital status: Widowed    Spouse name: Not on file   Number of children: 3   Years of education: Not on file   Highest education level: Some college, no degree  Occupational History   Occupation: retired  Tobacco  Use   Smoking status: Former    Current packs/day: 0.00    Average packs/day: 1 pack/day for 22.0 years (22.0 ttl pk-yrs)    Types: Cigarettes    Start date: 31    Quit date: 56    Years since quitting: 42.9   Smokeless tobacco: Never  Vaping Use   Vaping status: Never Used  Substance and Sexual Activity   Alcohol use: Not Currently    Comment: 05/22/2018 glass of wine couple times/wk   Drug use: Not Currently   Sexual activity: Not Currently    Birth control/protection: None, Post-menopausal  Other Topics Concern   Not on file  Social History Narrative   Not on file   Social Drivers of Health   Tobacco Use: Medium Risk (04/28/2024)   Patient History    Smoking Tobacco Use: Former    Smokeless Tobacco Use: Never    Passive Exposure: Not on file  Financial Resource Strain: Medium Risk (12/22/2021)   Overall Financial Resource Strain (CARDIA)    Difficulty of Paying Living Expenses: Somewhat hard  Food Insecurity: Not on file  Transportation Needs: Unmet Transportation Needs (05/08/2022)   PRAPARE - Transportation    Lack of Transportation (Medical): Yes    Lack of Transportation (Non-Medical): Yes  Physical Activity: Not on file  Stress: Not on file  Social Connections: Not on file  Intimate Partner Violence: Not on file  Depression (PHQ2-9): Low Risk (10/24/2021)   Depression (PHQ2-9)    PHQ-2 Score: 0  Alcohol Screen: Not on file  Housing: Not on file  Utilities: Not on file  Health Literacy: Not on file    Physical Exam      Future Appointments  Date Time Provider Department Center  07/27/2024 11:30 AM [facility] PA/NP [facility] None  07/28/2024  7:15 AM [facility] DEVICE REMOTES [facility]  10/27/2024  7:15 AM [facility] DEVICE REMOTES [facility]  01/26/2025  7:15 AM [facility] DEVICE REMOTES [facility]  04/27/2025  7:15 AM [facility] DEVICE REMOTES [facility]                 [1]  Current Outpatient Medications:    acetaminophen  (TYLENOL ) 500 MG  tablet, Take 500-1,000 mg by mouth every 6 (six) hours as needed (for pain or headaches)., Disp: , Rfl:    acetic acid 2 % otic solution, Place 4 drops into the left ear daily., Disp: , Rfl:    allopurinol  (ZYLOPRIM ) 100 MG tablet, Take 100 mg by mouth daily., Disp: , Rfl:    apixaban  (ELIQUIS ) 5 MG TABS tablet, Take 1 tablet (5 mg total) by mouth 2 (two) times daily., Disp: 180 tablet, Rfl: 3   atorvastatin  (LIPITOR ) 80 MG tablet, Take 1 tablet (80 mg total) by mouth at bedtime., Disp: 100 tablet, Rfl: 3   B Complex-Biotin -FA (B-COMPLEX PO), Take 1 tablet by mouth daily., Disp: , Rfl:    Biotin  10 MG TABS, Take 10 mg by mouth daily., Disp: , Rfl:    Blood Glucose Monitoring Suppl (ONETOUCH VERIO) w/Device KIT, Use to check blood sugar, Disp: 1 kit, Rfl: 0   carvedilol  (COREG )  3.125 MG tablet, Take 1 tablet (3.125 mg total) by mouth 2 (two) times daily with a meal., Disp: 180 tablet, Rfl: 3   cetirizine (ZYRTEC) 10 MG tablet, Take 10 mg by mouth daily., Disp: , Rfl:    Cinnamon 500 MG capsule, Take 2,000 mg by mouth daily., Disp: , Rfl:    clotrimazole-betamethasone (LOTRISONE) lotion, Apply 1 application topically 2 (two) times daily as needed (itching)., Disp: , Rfl:    Continuous Glucose Sensor (DEXCOM G6 SENSOR) MISC, 1 Device by Does not apply route as directed., Disp: 9 each, Rfl: 3   Continuous Glucose Transmitter (DEXCOM G6 TRANSMITTER) MISC, Change every 90 days, Disp: 1 each, Rfl: 3   empagliflozin  (JARDIANCE ) 10 MG TABS tablet, Take 1 tablet (10 mg total) by mouth daily., Disp: 90 tablet, Rfl: 3   ferrous sulfate  325 (65 FE) MG tablet, Take 1 tablet (325 mg total) by mouth 3 (three) times a week., Disp: 45 tablet, Rfl: 3   folic acid  (FOLVITE ) 1 MG tablet, Take 1 tablet (1 mg total) by mouth daily., Disp: 90 tablet, Rfl: 3   gabapentin  (NEURONTIN ) 300 MG capsule, Take 1 capsule (300 mg total) by mouth 2 (two) times daily., Disp: 60 capsule, Rfl: 2   gabapentin  (NEURONTIN ) 300 MG capsule,  Take 2 capsules (600 mg total) by mouth 2 (two) times daily., Disp: 360 capsule, Rfl: 3   glucose blood (ONETOUCH VERIO) test strip, Check blood sugar 2 times daily, Disp: 100 each, Rfl: 12   insulin  aspart (NOVOLOG  FLEXPEN) 100 UNIT/ML FlexPen, DIAL  AND INJECT UNDER THE SKIN THREE TIMES DAILY PER CORRECTION SCALE. MAX DAILY DOSE 50 UNITS, Disp: 15 mL, Rfl: 0   Insulin  Glargine (BASAGLAR  KWIKPEN) 100 UNIT/ML, Inject 40 Units into the skin at bedtime. (Patient taking differently: Inject 50 Units into the skin at bedtime.), Disp: 45 mL, Rfl: 2   Insulin  Pen Needle 32G X 4 MM MISC, 1 Device by Does not apply route in the morning, at noon, in the evening, and at bedtime., Disp: 400 each, Rfl: 3   Lancets (ONETOUCH DELICA PLUS LANCET33G) MISC, USE   TO CHECK GLUCOSE 4 TIMES DAILY, Disp: 100 each, Rfl: 3   latanoprost  (XALATAN ) 0.005 % ophthalmic solution, Place 1 drop into both eyes at bedtime., Disp: , Rfl:    levothyroxine  (SYNTHROID , LEVOTHROID) 75 MCG tablet, Take 75 mcg by mouth daily before breakfast., Disp: , Rfl:    LUMIGAN 0.01 % SOLN, Place 1 drop into both eyes at bedtime., Disp: , Rfl:    Multiple Vitamins-Minerals (ALIVE ONCE DAILY WOMENS 50+ PO), Take 1 tablet by mouth daily., Disp: , Rfl:    Omega-3 Fatty Acids (FISH OIL) 1200 MG CAPS, Take 1,200 mg by mouth at bedtime. Taking 2 capsules at night, Disp: , Rfl:    ondansetron  (ZOFRAN ) 4 MG tablet, Take 1 tablet (4 mg total) by mouth every 8 (eight) hours as needed for nausea or vomiting., Disp: 20 tablet, Rfl: 0   spironolactone  (ALDACTONE ) 25 MG tablet, Take 1 tablet (25 mg total) by mouth daily., Disp: 90 tablet, Rfl: 3   torsemide  (DEMADEX ) 20 MG tablet, TAKE 3 TABLETS BY MOUTH DAILY, Disp: 90 tablet, Rfl: 3   Travoprost, BAK Free, (TRAVATAN) 0.004 % SOLN ophthalmic solution, Place 1 drop into both eyes at bedtime., Disp: , Rfl:  [2]  Allergies Allergen Reactions   Codeine Other (See Comments)   Other Other (See Comments)    Blood  Product Refusal (Jehovah's witness)  Tape Other (See Comments)    Leaves blisters and marks on the skin   Sulfa Antibiotics Rash   Uloric  [Febuxostat ] Rash

## 2024-05-18 ENCOUNTER — Other Ambulatory Visit (HOSPITAL_COMMUNITY): Payer: Self-pay

## 2024-05-18 NOTE — Progress Notes (Signed)
 Paramedicine Encounter    Patient ID: Danielle [name], female    DOB: 1945/11/17, 78 y.o.   MRN: [id number]   Complaints- none   Assessment- CAOx4, warm and dry ambulatory in her home without shortness of breath or difficulty walking, lungs clear, some edema noted to lower legs- placing compression stockings on at present, vitals obtained- CBG- 154   Compliance with meds- missed a few pills in pill box over the last 11 days   Pill box filled- for one week   Refills needed- allopurinol , atorvastatin , eliquis   Meds changes since last visit- none     Social changes- none    VISIT SUMMARY- Arrived for home visit for [facility] who reports to be feeling well with no complaints today. Lungs clear, vitals obtained- CBG 154. She has not been checking her sugar regularly as her freestyle fell off while out of town and she couldn't find her manual glucometer so she has been watching her diet. I reminded her the importance of having her manual kit at all times. She understood and says she will find it. I replaced a freestyle sensor on today. I reviewed meds and filled pill box for one week. Refills will be called into [facility]. Home visit complete. I will see her in one week.   BP 110/70   Pulse 72   Resp 16   Wt 203 lb (92.1 kg)   SpO2 97%   BMI 34.84 kg/m  Weight yesterday-- didn't weigh  Last visit weight-- 202      ACTION: Home visit completed     Patient Care Team: Rexanne Ingle, MD as PCP - General (Internal Medicine) Inocencio Soyla Lunger, MD as PCP - Electrophysiology (Cardiology) Shlomo Wilbert SAUNDERS, MD as PCP - Sleep Medicine (Cardiology) Bensimhon, Toribio SAUNDERS, MD as PCP - Cardiology (Cardiology) Cathern Andriette DEL, LCSW as Social Worker (Licensed Clinical Social Worker) Shamleffer, Donell Cardinal, MD as Consulting Physician (Endocrinology)  Patient Active Problem List   Diagnosis Date Noted   Chronic systolic heart failure (HCC) 05/08/2022   Type 2 diabetes mellitus  with stage 3a chronic kidney disease, with long-term current use of insulin  (HCC) 02/16/2022   Prosthetic joint implant failure, initial encounter 12/20/2021   Pseudophakia of both eyes 11/15/2021   DVT (deep venous thrombosis) (HCC) 07/31/2021   Acute blood loss anemia    Septic Arthritis, Infection of prosthetic right knee joint (HCC), MSSA infection 07/10/2021   Complication of internal right knee prosthesis 07/10/2021   CAD (coronary artery disease) 06/08/2021   Carotid artery disease 06/08/2021   Symptomatic bradycardia 06/08/2021   Paroxysmal atrial fibrillation (HCC) 06/08/2021   CKD (chronic kidney disease), stage III (HCC) 06/08/2021   Polyneuropathy associated with underlying disease 05/04/2020   Severe nonproliferative diabetic retinopathy of left eye, with macular edema, associated with type 2 diabetes mellitus (HCC) 11/05/2019   Severe nonproliferative diabetic retinopathy of right eye, with macular edema, associated with type 2 diabetes mellitus (HCC) 11/05/2019   Retinal hemorrhage of right eye 11/05/2019   Retinal hemorrhage of left eye 11/05/2019   Diabetes mellitus (HCC) 05/14/2019   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, with long-term current use of insulin  (HCC) 05/14/2019   Type 2 diabetes mellitus with diabetic polyneuropathy, with long-term current use of insulin  (HCC) 05/14/2019   History of gout 05/20/2018   Primary localized osteoarthritis of right knee 05/13/2018   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) 12/20/2017   Obesity (BMI 30-39.9) 11/11/2017   OSA (obstructive sleep apnea) 02/18/2017  Essential hypertension    L Breast Cancer    Hypothyroidism    Current Medications[1] Allergies[2]   Social History   Socioeconomic History   Marital status: Widowed    Spouse name: Not on file   Number of children: 3   Years of education: Not on file   Highest education level: Some college, no degree  Occupational History    Occupation: retired  Tobacco Use   Smoking status: Former    Current packs/day: 0.00    Average packs/day: 1 pack/day for 22.0 years (22.0 ttl pk-yrs)    Types: Cigarettes    Start date: 20    Quit date: 1983    Years since quitting: 43.0   Smokeless tobacco: Never  Vaping Use   Vaping status: Never Used  Substance and Sexual Activity   Alcohol use: Not Currently    Comment: 05/22/2018 glass of wine couple times/wk   Drug use: Not Currently   Sexual activity: Not Currently    Birth control/protection: None, Post-menopausal  Other Topics Concern   Not on file  Social History Narrative   Not on file   Social Drivers of Health   Tobacco Use: Medium Risk (04/28/2024)   Patient History    Smoking Tobacco Use: Former    Smokeless Tobacco Use: Never    Passive Exposure: Not on file  Financial Resource Strain: Medium Risk (12/22/2021)   Overall Financial Resource Strain (CARDIA)    Difficulty of Paying Living Expenses: Somewhat hard  Food Insecurity: Not on file  Transportation Needs: Unmet Transportation Needs (05/08/2022)   PRAPARE - Transportation    Lack of Transportation (Medical): Yes    Lack of Transportation (Non-Medical): Yes  Physical Activity: Not on file  Stress: Not on file  Social Connections: Not on file  Intimate Partner Violence: Not on file  Depression (PHQ2-9): Low Risk (10/24/2021)   Depression (PHQ2-9)    PHQ-2 Score: 0  Alcohol Screen: Not on file  Housing: Not on file  Utilities: Not on file  Health Literacy: Not on file    Physical Exam      Future Appointments  Date Time Provider Department Center  07/27/2024 11:30 AM [facility] PA/NP [facility] None  07/28/2024  7:15 AM [facility] DEVICE REMOTES [facility]  10/27/2024  7:15 AM [facility] DEVICE REMOTES [facility]  01/26/2025  7:15 AM [facility] DEVICE REMOTES [facility]  04/27/2025  7:15 AM [facility] DEVICE REMOTES [facility]                 [1]  Current Outpatient Medications:     acetaminophen  (TYLENOL ) 500 MG tablet, Take 500-1,000 mg by mouth every 6 (six) hours as needed (for pain or headaches)., Disp: , Rfl:    acetic acid 2 % otic solution, Place 4 drops into the left ear daily., Disp: , Rfl:    allopurinol  (ZYLOPRIM ) 100 MG tablet, Take 100 mg by mouth daily., Disp: , Rfl:    apixaban  (ELIQUIS ) 5 MG TABS tablet, Take 1 tablet (5 mg total) by mouth 2 (two) times daily., Disp: 180 tablet, Rfl: 3   atorvastatin  (LIPITOR ) 80 MG tablet, Take 1 tablet (80 mg total) by mouth at bedtime., Disp: 100 tablet, Rfl: 3   B Complex-Biotin -FA (B-COMPLEX PO), Take 1 tablet by mouth daily., Disp: , Rfl:    Biotin  10 MG TABS, Take 10 mg by mouth daily., Disp: , Rfl:    Blood Glucose Monitoring Suppl (ONETOUCH VERIO) w/Device KIT, Use to check  blood sugar, Disp: 1 kit, Rfl: 0   carvedilol  (COREG ) 3.125 MG tablet, Take 1 tablet (3.125 mg total) by mouth 2 (two) times daily with a meal., Disp: 180 tablet, Rfl: 3   cetirizine (ZYRTEC) 10 MG tablet, Take 10 mg by mouth daily., Disp: , Rfl:    Cinnamon 500 MG capsule, Take 2,000 mg by mouth daily., Disp: , Rfl:    clotrimazole-betamethasone (LOTRISONE) lotion, Apply 1 application topically 2 (two) times daily as needed (itching)., Disp: , Rfl:    Continuous Glucose Sensor (DEXCOM G6 SENSOR) MISC, 1 Device by Does not apply route as directed., Disp: 9 each, Rfl: 3   Continuous Glucose Transmitter (DEXCOM G6 TRANSMITTER) MISC, Change every 90 days, Disp: 1 each, Rfl: 3   empagliflozin  (JARDIANCE ) 10 MG TABS tablet, Take 1 tablet (10 mg total) by mouth daily., Disp: 90 tablet, Rfl: 3   ferrous sulfate  325 (65 FE) MG tablet, Take 1 tablet (325 mg total) by mouth 3 (three) times a week., Disp: 45 tablet, Rfl: 3   folic acid  (FOLVITE ) 1 MG tablet, Take 1 tablet (1 mg total) by mouth daily., Disp: 90 tablet, Rfl: 3   gabapentin  (NEURONTIN ) 300 MG capsule, Take 1 capsule (300 mg total) by mouth 2 (two) times daily., Disp: 60 capsule, Rfl: 2    gabapentin  (NEURONTIN ) 300 MG capsule, Take 2 capsules (600 mg total) by mouth 2 (two) times daily., Disp: 360 capsule, Rfl: 3   glucose blood (ONETOUCH VERIO) test strip, Check blood sugar 2 times daily, Disp: 100 each, Rfl: 12   insulin  aspart (NOVOLOG  FLEXPEN) 100 UNIT/ML FlexPen, DIAL  AND INJECT UNDER THE SKIN THREE TIMES DAILY PER CORRECTION SCALE. MAX DAILY DOSE 50 UNITS, Disp: 15 mL, Rfl: 0   Insulin  Glargine (BASAGLAR  KWIKPEN) 100 UNIT/ML, Inject 40 Units into the skin at bedtime. (Patient taking differently: Inject 50 Units into the skin at bedtime.), Disp: 45 mL, Rfl: 2   Insulin  Pen Needle 32G X 4 MM MISC, 1 Device by Does not apply route in the morning, at noon, in the evening, and at bedtime., Disp: 400 each, Rfl: 3   Lancets (ONETOUCH DELICA PLUS LANCET33G) MISC, USE   TO CHECK GLUCOSE 4 TIMES DAILY, Disp: 100 each, Rfl: 3   latanoprost  (XALATAN ) 0.005 % ophthalmic solution, Place 1 drop into both eyes at bedtime., Disp: , Rfl:    levothyroxine  (SYNTHROID , LEVOTHROID) 75 MCG tablet, Take 75 mcg by mouth daily before breakfast., Disp: , Rfl:    LUMIGAN 0.01 % SOLN, Place 1 drop into both eyes at bedtime., Disp: , Rfl:    Multiple Vitamins-Minerals (ALIVE ONCE DAILY WOMENS 50+ PO), Take 1 tablet by mouth daily., Disp: , Rfl:    Omega-3 Fatty Acids (FISH OIL) 1200 MG CAPS, Take 1,200 mg by mouth at bedtime. Taking 2 capsules at night, Disp: , Rfl:    ondansetron  (ZOFRAN ) 4 MG tablet, Take 1 tablet (4 mg total) by mouth every 8 (eight) hours as needed for nausea or vomiting., Disp: 20 tablet, Rfl: 0   spironolactone  (ALDACTONE ) 25 MG tablet, Take 1 tablet (25 mg total) by mouth daily., Disp: 90 tablet, Rfl: 3   torsemide  (DEMADEX ) 20 MG tablet, TAKE 3 TABLETS BY MOUTH DAILY, Disp: 90 tablet, Rfl: 3   Travoprost, BAK Free, (TRAVATAN) 0.004 % SOLN ophthalmic solution, Place 1 drop into both eyes at bedtime., Disp: , Rfl:  [2]  Allergies Allergen Reactions   Codeine Other (See Comments)    Other Other (See Comments)  Blood Product Refusal (Jehovah's witness)     Tape Other (See Comments)    Leaves blisters and marks on the skin   Sulfa Antibiotics Rash   Uloric  [Febuxostat ] Rash

## 2024-05-25 ENCOUNTER — Other Ambulatory Visit (HOSPITAL_COMMUNITY): Payer: Self-pay

## 2024-05-26 NOTE — Progress Notes (Signed)
 Paramedicine Encounter    Patient ID: Danielle [name], female    DOB: 1945/11/21, 78 y.o.   MRN: [id number]   Complaints- none   Assessment- CAOX4, warm and dry ambulatory without shortness of breath, lungs clear, mild lower leg edema, weight down 5 lbs from last week  Compliance with meds- no missed doses   Pill box filled-for two weeks (she is going to [place])  Refills needed- torsemide    Meds changes since last visit- none     Social changes- going to [place] for one week    VISIT SUMMARY- Arrived for home visit for [facility] who reports to be feeling well with no complaints today. No shortness of breath on exertion or at rest. Lungs clear. Vitals obtained as noted. CBG- 214 (ate 4 mini donuts last night) I educated on diabetes management) We reviewed meds and filled pill boxes for two weeks. HF education provided also. She understood. Weight is down and lower leg edema has improved. She plans to return home from [place] in two weeks. I will follow up then. Home visit complete.   Wt 199 lb (90.3 kg)   BMI 34.16 kg/m  Weight yesterday-- 201lbs  Last visit weight-- 203lbs      ACTION: Home visit completed     Patient Care Team: Rexanne Ingle, MD as PCP - General (Internal Medicine) Inocencio Soyla Lunger, MD as PCP - Electrophysiology (Cardiology) Shlomo Wilbert SAUNDERS, MD as PCP - Sleep Medicine (Cardiology) Bensimhon, Toribio SAUNDERS, MD as PCP - Cardiology (Cardiology) Cathern Andriette DEL, LCSW as Social Worker (Licensed Clinical Social Worker) Shamleffer, Donell Cardinal, MD as Consulting Physician (Endocrinology)  Patient Active Problem List   Diagnosis Date Noted   Chronic systolic heart failure (HCC) 05/08/2022   Type 2 diabetes mellitus with stage 3a chronic kidney disease, with long-term current use of insulin  (HCC) 02/16/2022   Prosthetic joint implant failure, initial encounter 12/20/2021   Pseudophakia of both eyes 11/15/2021   DVT (deep venous thrombosis) (HCC) 07/31/2021   Acute blood  loss anemia    Septic Arthritis, Infection of prosthetic right knee joint (HCC), MSSA infection 07/10/2021   Complication of internal right knee prosthesis 07/10/2021   CAD (coronary artery disease) 06/08/2021   Carotid artery disease 06/08/2021   Symptomatic bradycardia 06/08/2021   Paroxysmal atrial fibrillation (HCC) 06/08/2021   CKD (chronic kidney disease), stage III (HCC) 06/08/2021   Polyneuropathy associated with underlying disease 05/04/2020   Severe nonproliferative diabetic retinopathy of left eye, with macular edema, associated with type 2 diabetes mellitus (HCC) 11/05/2019   Severe nonproliferative diabetic retinopathy of right eye, with macular edema, associated with type 2 diabetes mellitus (HCC) 11/05/2019   Retinal hemorrhage of right eye 11/05/2019   Retinal hemorrhage of left eye 11/05/2019   Diabetes mellitus (HCC) 05/14/2019   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, with long-term current use of insulin  (HCC) 05/14/2019   Type 2 diabetes mellitus with diabetic polyneuropathy, with long-term current use of insulin  (HCC) 05/14/2019   History of gout 05/20/2018   Primary localized osteoarthritis of right knee 05/13/2018   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) 12/20/2017   Obesity (BMI 30-39.9) 11/11/2017   OSA (obstructive sleep apnea) 02/18/2017   Essential hypertension    L Breast Cancer    Hypothyroidism    Current Medications[1] Allergies[2]   Social History   Socioeconomic History   Marital status: Widowed    Spouse name: Not on file   Number of children: 3   Years of education:  Not on file   Highest education level: Some college, no degree  Occupational History   Occupation: retired  Tobacco Use   Smoking status: Former    Current packs/day: 0.00    Average packs/day: 1 pack/day for 22.0 years (22.0 ttl pk-yrs)    Types: Cigarettes    Start date: 34    Quit date: 36    Years since quitting: 43.0   Smokeless tobacco:  Never  Vaping Use   Vaping status: Never Used  Substance and Sexual Activity   Alcohol use: Not Currently    Comment: 05/22/2018 glass of wine couple times/wk   Drug use: Not Currently   Sexual activity: Not Currently    Birth control/protection: None, Post-menopausal  Other Topics Concern   Not on file  Social History Narrative   Not on file   Social Drivers of Health   Tobacco Use: Medium Risk (04/28/2024)   Patient History    Smoking Tobacco Use: Former    Smokeless Tobacco Use: Never    Passive Exposure: Not on file  Financial Resource Strain: Medium Risk (12/22/2021)   Overall Financial Resource Strain (CARDIA)    Difficulty of Paying Living Expenses: Somewhat hard  Food Insecurity: Not on file  Transportation Needs: Unmet Transportation Needs (05/08/2022)   PRAPARE - Transportation    Lack of Transportation (Medical): Yes    Lack of Transportation (Non-Medical): Yes  Physical Activity: Not on file  Stress: Not on file  Social Connections: Not on file  Intimate Partner Violence: Not on file  Depression (PHQ2-9): Low Risk (10/24/2021)   Depression (PHQ2-9)    PHQ-2 Score: 0  Alcohol Screen: Not on file  Housing: Not on file  Utilities: Not on file  Health Literacy: Not on file    Physical Exam      Future Appointments  Date Time Provider Department Center  07/27/2024 11:30 AM [facility] PA/NP [facility] None  07/28/2024  7:15 AM [facility] DEVICE REMOTES [facility]  10/27/2024  7:15 AM [facility] DEVICE REMOTES [facility]  01/26/2025  7:15 AM [facility] DEVICE REMOTES [facility]  04/27/2025  7:15 AM [facility] DEVICE REMOTES [facility]                 [1]  Current Outpatient Medications:    acetaminophen  (TYLENOL ) 500 MG tablet, Take 500-1,000 mg by mouth every 6 (six) hours as needed (for pain or headaches)., Disp: , Rfl:    acetic acid 2 % otic solution, Place 4 drops into the left ear daily., Disp: , Rfl:    allopurinol  (ZYLOPRIM ) 100 MG tablet, Take  100 mg by mouth daily., Disp: , Rfl:    apixaban  (ELIQUIS ) 5 MG TABS tablet, Take 1 tablet (5 mg total) by mouth 2 (two) times daily., Disp: 180 tablet, Rfl: 3   atorvastatin  (LIPITOR ) 80 MG tablet, Take 1 tablet (80 mg total) by mouth at bedtime., Disp: 100 tablet, Rfl: 3   B Complex-Biotin -FA (B-COMPLEX PO), Take 1 tablet by mouth daily., Disp: , Rfl:    Biotin  10 MG TABS, Take 10 mg by mouth daily., Disp: , Rfl:    Blood Glucose Monitoring Suppl (ONETOUCH VERIO) w/Device KIT, Use to check blood sugar, Disp: 1 kit, Rfl: 0   carvedilol  (COREG ) 3.125 MG tablet, Take 1 tablet (3.125 mg total) by mouth 2 (two) times daily with a meal., Disp: 180 tablet, Rfl: 3   cetirizine (ZYRTEC) 10 MG tablet, Take 10 mg by mouth daily., Disp: , Rfl:  Cinnamon 500 MG capsule, Take 2,000 mg by mouth daily., Disp: , Rfl:    clotrimazole-betamethasone (LOTRISONE) lotion, Apply 1 application topically 2 (two) times daily as needed (itching)., Disp: , Rfl:    Continuous Glucose Sensor (DEXCOM G6 SENSOR) MISC, 1 Device by Does not apply route as directed., Disp: 9 each, Rfl: 3   Continuous Glucose Transmitter (DEXCOM G6 TRANSMITTER) MISC, Change every 90 days, Disp: 1 each, Rfl: 3   empagliflozin  (JARDIANCE ) 10 MG TABS tablet, Take 1 tablet (10 mg total) by mouth daily., Disp: 90 tablet, Rfl: 3   ferrous sulfate  325 (65 FE) MG tablet, Take 1 tablet (325 mg total) by mouth 3 (three) times a week., Disp: 45 tablet, Rfl: 3   folic acid  (FOLVITE ) 1 MG tablet, Take 1 tablet (1 mg total) by mouth daily., Disp: 90 tablet, Rfl: 3   gabapentin  (NEURONTIN ) 300 MG capsule, Take 1 capsule (300 mg total) by mouth 2 (two) times daily., Disp: 60 capsule, Rfl: 2   gabapentin  (NEURONTIN ) 300 MG capsule, Take 2 capsules (600 mg total) by mouth 2 (two) times daily., Disp: 360 capsule, Rfl: 3   glucose blood (ONETOUCH VERIO) test strip, Check blood sugar 2 times daily, Disp: 100 each, Rfl: 12   insulin  aspart (NOVOLOG  FLEXPEN) 100 UNIT/ML  FlexPen, DIAL  AND INJECT UNDER THE SKIN THREE TIMES DAILY PER CORRECTION SCALE. MAX DAILY DOSE 50 UNITS, Disp: 15 mL, Rfl: 0   Insulin  Glargine (BASAGLAR  KWIKPEN) 100 UNIT/ML, Inject 40 Units into the skin at bedtime. (Patient taking differently: Inject 50 Units into the skin at bedtime.), Disp: 45 mL, Rfl: 2   Insulin  Pen Needle 32G X 4 MM MISC, 1 Device by Does not apply route in the morning, at noon, in the evening, and at bedtime., Disp: 400 each, Rfl: 3   Lancets (ONETOUCH DELICA PLUS LANCET33G) MISC, USE   TO CHECK GLUCOSE 4 TIMES DAILY, Disp: 100 each, Rfl: 3   latanoprost  (XALATAN ) 0.005 % ophthalmic solution, Place 1 drop into both eyes at bedtime., Disp: , Rfl:    levothyroxine  (SYNTHROID , LEVOTHROID) 75 MCG tablet, Take 75 mcg by mouth daily before breakfast., Disp: , Rfl:    LUMIGAN 0.01 % SOLN, Place 1 drop into both eyes at bedtime., Disp: , Rfl:    Multiple Vitamins-Minerals (ALIVE ONCE DAILY WOMENS 50+ PO), Take 1 tablet by mouth daily., Disp: , Rfl:    Omega-3 Fatty Acids (FISH OIL) 1200 MG CAPS, Take 1,200 mg by mouth at bedtime. Taking 2 capsules at night, Disp: , Rfl:    ondansetron  (ZOFRAN ) 4 MG tablet, Take 1 tablet (4 mg total) by mouth every 8 (eight) hours as needed for nausea or vomiting., Disp: 20 tablet, Rfl: 0   spironolactone  (ALDACTONE ) 25 MG tablet, Take 1 tablet (25 mg total) by mouth daily., Disp: 90 tablet, Rfl: 3   torsemide  (DEMADEX ) 20 MG tablet, TAKE 3 TABLETS BY MOUTH DAILY, Disp: 90 tablet, Rfl: 3   Travoprost, BAK Free, (TRAVATAN) 0.004 % SOLN ophthalmic solution, Place 1 drop into both eyes at bedtime., Disp: , Rfl:  [2]  Allergies Allergen Reactions   Codeine Other (See Comments)   Other Other (See Comments)    Blood Product Refusal (Jehovah's witness)     Tape Other (See Comments)    Leaves blisters and marks on the skin   Sulfa Antibiotics Rash   Uloric  [Febuxostat ] Rash

## 2024-06-08 ENCOUNTER — Other Ambulatory Visit (HOSPITAL_COMMUNITY): Payer: Self-pay

## 2024-06-08 NOTE — Progress Notes (Signed)
 Paramedicine Encounter    Patient ID: Danielle [name], female    DOB: 1946-01-25, 79 y.o.   MRN: [id number]   Complaints- NONE   Assessment- CAOX4, warm and dry ambulatory with walker, not short of breath, no lower leg edema, wearing compression socks, vitals within normal, weight at baseline.   Compliance with meds- 6 missed doses over the last 2 weeks  4 morning and 2 evening   Pill box filled- for one week   Refills needed- spironolactone , freestyle sensor   Meds changes since last visit- none     Social changes- none    VISIT SUMMARY- Arrived for home visit for [facility] who reports to be feeling well with some post nasal drip but otherwise no complaints. She denied any shortness of breath, dizziness, chest pain or swelling. No edema noted. Lungs clear. Vitals within normal for her. CBG- 210 she had just had lunch. I reviewed meds and filled pill box for one week- she missed several doses while out of town over the last two weeks. Appointments confirmed and reviewed. I plan to come out in one week she agreed. HF education and DM education provided. Home visit complete I will see her in one week. She agreed with plan.   BP (!) 110/58   Pulse 62   Resp 16   Wt 198 lb (89.8 kg)   SpO2 97%   BMI 33.99 kg/m  Weight yesterday-- did  not weigh  Last visit weight-- 199lbs      ACTION: Home visit completed     Patient Care Team: Rexanne Ingle, MD as PCP - General (Internal Medicine) Inocencio Soyla Lunger, MD as PCP - Electrophysiology (Cardiology) Shlomo Wilbert SAUNDERS, MD as PCP - Sleep Medicine (Cardiology) Bensimhon, Toribio SAUNDERS, MD as PCP - Cardiology (Cardiology) Cathern Andriette DEL, LCSW as Social Worker (Licensed Clinical Social Worker) Shamleffer, Donell Cardinal, MD as Consulting Physician (Endocrinology)  Patient Active Problem List   Diagnosis Date Noted   Chronic systolic heart failure (HCC) 05/08/2022   Type 2 diabetes mellitus with stage 3a chronic kidney disease, with  long-term current use of insulin  (HCC) 02/16/2022   Prosthetic joint implant failure, initial encounter 12/20/2021   Pseudophakia of both eyes 11/15/2021   DVT (deep venous thrombosis) (HCC) 07/31/2021   Acute blood loss anemia    Septic Arthritis, Infection of prosthetic right knee joint (HCC), MSSA infection 07/10/2021   Complication of internal right knee prosthesis 07/10/2021   CAD (coronary artery disease) 06/08/2021   Carotid artery disease 06/08/2021   Symptomatic bradycardia 06/08/2021   Paroxysmal atrial fibrillation (HCC) 06/08/2021   CKD (chronic kidney disease), stage III (HCC) 06/08/2021   Polyneuropathy associated with underlying disease 05/04/2020   Severe nonproliferative diabetic retinopathy of left eye, with macular edema, associated with type 2 diabetes mellitus (HCC) 11/05/2019   Severe nonproliferative diabetic retinopathy of right eye, with macular edema, associated with type 2 diabetes mellitus (HCC) 11/05/2019   Retinal hemorrhage of right eye 11/05/2019   Retinal hemorrhage of left eye 11/05/2019   Diabetes mellitus (HCC) 05/14/2019   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, with long-term current use of insulin  (HCC) 05/14/2019   Type 2 diabetes mellitus with diabetic polyneuropathy, with long-term current use of insulin  (HCC) 05/14/2019   History of gout 05/20/2018   Primary localized osteoarthritis of right knee 05/13/2018   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) 12/20/2017   Obesity (BMI 30-39.9) 11/11/2017   OSA (obstructive sleep apnea) 02/18/2017   Essential hypertension  L Breast Cancer    Hypothyroidism    Current Medications[1] Allergies[2]   Social History   Socioeconomic History   Marital status: Widowed    Spouse name: Not on file   Number of children: 3   Years of education: Not on file   Highest education level: Some college, no degree  Occupational History   Occupation: retired  Tobacco Use   Smoking status:  Former    Current packs/day: 0.00    Average packs/day: 1 pack/day for 22.0 years (22.0 ttl pk-yrs)    Types: Cigarettes    Start date: 58    Quit date: 1983    Years since quitting: 43.0   Smokeless tobacco: Never  Vaping Use   Vaping status: Never Used  Substance and Sexual Activity   Alcohol use: Not Currently    Comment: 05/22/2018 glass of wine couple times/wk   Drug use: Not Currently   Sexual activity: Not Currently    Birth control/protection: None, Post-menopausal  Other Topics Concern   Not on file  Social History Narrative   Not on file   Social Drivers of Health   Tobacco Use: Medium Risk (04/28/2024)   Patient History    Smoking Tobacco Use: Former    Smokeless Tobacco Use: Never    Passive Exposure: Not on file  Financial Resource Strain: Medium Risk (12/22/2021)   Overall Financial Resource Strain (CARDIA)    Difficulty of Paying Living Expenses: Somewhat hard  Food Insecurity: Not on file  Transportation Needs: Unmet Transportation Needs (05/08/2022)   PRAPARE - Transportation    Lack of Transportation (Medical): Yes    Lack of Transportation (Non-Medical): Yes  Physical Activity: Not on file  Stress: Not on file  Social Connections: Not on file  Intimate Partner Violence: Not on file  Depression (PHQ2-9): Low Risk (10/24/2021)   Depression (PHQ2-9)    PHQ-2 Score: 0  Alcohol Screen: Not on file  Housing: Not on file  Utilities: Not on file  Health Literacy: Not on file    Physical Exam      Future Appointments  Date Time Provider Department Center  07/27/2024 11:30 AM [facility] PA/NP [facility] None  07/28/2024  7:15 AM [facility] DEVICE REMOTES [facility]  10/27/2024  7:15 AM [facility] DEVICE REMOTES [facility]V  01/26/2025  7:15 AM [facility] DEVICE REMOTES [facility]V  04/27/2025  7:15 AM [facility] DEVICE REMOTES [facility]                 [1]  Current Outpatient Medications:    acetaminophen  (TYLENOL ) 500 MG tablet, Take 500-1,000  mg by mouth every 6 (six) hours as needed (for pain or headaches)., Disp: , Rfl:    acetic acid 2 % otic solution, Place 4 drops into the left ear daily., Disp: , Rfl:    allopurinol  (ZYLOPRIM ) 100 MG tablet, Take 100 mg by mouth daily., Disp: , Rfl:    apixaban  (ELIQUIS ) 5 MG TABS tablet, Take 1 tablet (5 mg total) by mouth 2 (two) times daily., Disp: 180 tablet, Rfl: 3   atorvastatin  (LIPITOR ) 80 MG tablet, Take 1 tablet (80 mg total) by mouth at bedtime., Disp: 100 tablet, Rfl: 3   B Complex-Biotin -FA (B-COMPLEX PO), Take 1 tablet by mouth daily., Disp: , Rfl:    Biotin  10 MG TABS, Take 10 mg by mouth daily., Disp: , Rfl:    Blood Glucose Monitoring Suppl (ONETOUCH VERIO) w/Device KIT, Use to check blood sugar, Disp: 1 kit,  Rfl: 0   carvedilol  (COREG ) 3.125 MG tablet, Take 1 tablet (3.125 mg total) by mouth 2 (two) times daily with a meal., Disp: 180 tablet, Rfl: 3   cetirizine (ZYRTEC) 10 MG tablet, Take 10 mg by mouth daily., Disp: , Rfl:    Cinnamon 500 MG capsule, Take 2,000 mg by mouth daily., Disp: , Rfl:    clotrimazole-betamethasone (LOTRISONE) lotion, Apply 1 application topically 2 (two) times daily as needed (itching)., Disp: , Rfl:    Continuous Glucose Sensor (DEXCOM G6 SENSOR) MISC, 1 Device by Does not apply route as directed., Disp: 9 each, Rfl: 3   Continuous Glucose Transmitter (DEXCOM G6 TRANSMITTER) MISC, Change every 90 days, Disp: 1 each, Rfl: 3   empagliflozin  (JARDIANCE ) 10 MG TABS tablet, Take 1 tablet (10 mg total) by mouth daily., Disp: 90 tablet, Rfl: 3   ferrous sulfate  325 (65 FE) MG tablet, Take 1 tablet (325 mg total) by mouth 3 (three) times a week., Disp: 45 tablet, Rfl: 3   folic acid  (FOLVITE ) 1 MG tablet, Take 1 tablet (1 mg total) by mouth daily., Disp: 90 tablet, Rfl: 3   gabapentin  (NEURONTIN ) 300 MG capsule, Take 1 capsule (300 mg total) by mouth 2 (two) times daily., Disp: 60 capsule, Rfl: 2   gabapentin  (NEURONTIN ) 300 MG capsule, Take 2 capsules (600  mg total) by mouth 2 (two) times daily., Disp: 360 capsule, Rfl: 3   glucose blood (ONETOUCH VERIO) test strip, Check blood sugar 2 times daily, Disp: 100 each, Rfl: 12   insulin  aspart (NOVOLOG  FLEXPEN) 100 UNIT/ML FlexPen, DIAL  AND INJECT UNDER THE SKIN THREE TIMES DAILY PER CORRECTION SCALE. MAX DAILY DOSE 50 UNITS, Disp: 15 mL, Rfl: 0   Insulin  Glargine (BASAGLAR  KWIKPEN) 100 UNIT/ML, Inject 40 Units into the skin at bedtime. (Patient taking differently: Inject 50 Units into the skin at bedtime.), Disp: 45 mL, Rfl: 2   Insulin  Pen Needle 32G X 4 MM MISC, 1 Device by Does not apply route in the morning, at noon, in the evening, and at bedtime., Disp: 400 each, Rfl: 3   Lancets (ONETOUCH DELICA PLUS LANCET33G) MISC, USE   TO CHECK GLUCOSE 4 TIMES DAILY, Disp: 100 each, Rfl: 3   latanoprost  (XALATAN ) 0.005 % ophthalmic solution, Place 1 drop into both eyes at bedtime., Disp: , Rfl:    levothyroxine  (SYNTHROID , LEVOTHROID) 75 MCG tablet, Take 75 mcg by mouth daily before breakfast., Disp: , Rfl:    LUMIGAN 0.01 % SOLN, Place 1 drop into both eyes at bedtime., Disp: , Rfl:    Multiple Vitamins-Minerals (ALIVE ONCE DAILY WOMENS 50+ PO), Take 1 tablet by mouth daily., Disp: , Rfl:    Omega-3 Fatty Acids (FISH OIL) 1200 MG CAPS, Take 1,200 mg by mouth at bedtime. Taking 2 capsules at night, Disp: , Rfl:    ondansetron  (ZOFRAN ) 4 MG tablet, Take 1 tablet (4 mg total) by mouth every 8 (eight) hours as needed for nausea or vomiting., Disp: 20 tablet, Rfl: 0   spironolactone  (ALDACTONE ) 25 MG tablet, Take 1 tablet (25 mg total) by mouth daily., Disp: 90 tablet, Rfl: 3   torsemide  (DEMADEX ) 20 MG tablet, TAKE 3 TABLETS BY MOUTH DAILY, Disp: 90 tablet, Rfl: 3   Travoprost, BAK Free, (TRAVATAN) 0.004 % SOLN ophthalmic solution, Place 1 drop into both eyes at bedtime., Disp: , Rfl:  [2]  Allergies Allergen Reactions   Codeine Other (See Comments)   Other Other (See Comments)    Blood Product Refusal  (  Jehovah's witness)     Tape Other (See Comments)    Leaves blisters and marks on the skin   Sulfa Antibiotics Rash   Uloric  [Febuxostat ] Rash

## 2024-06-15 ENCOUNTER — Other Ambulatory Visit (HOSPITAL_COMMUNITY): Payer: Self-pay

## 2024-06-15 NOTE — Progress Notes (Signed)
 Paramedicine Encounter    Patient ID: Danielle [name], female    DOB: June 30, 1945, 79 y.o.   MRN: [id number]   Complaints- getting easily fatigued- not sleeping-   Assessment- CAOX4, warm and dry seated in her home reporting to be feeling okay but states she is tired from not sleeping much and or sleeping at weird hours of the day/night.   Compliance with meds- missed three doses of morning/eve meds (skips some torsemide )   Pill box filled- for one week   Refills needed- none   Meds changes since last visit-  none    Social changes- CPAP machine not covered by [facility]- will call [facility] on Weds to follow up.    VISIT SUMMARY- Arrived for home visit for Noelly who reports to be feeling well with no complaints today. No lower leg swelling. 2lbs weight gain since last week. Lungs clear. Vitals within normal limits. Blood sugar. 197- she had insulin  at 1130. Meds reviewed and confirmed and pill box filled for one week. No refills needed. She did report that [facility] called and stated she was not using her CPAP and that [employer] was not in coverage for her CPAP. I will call and follow up about same on Wednesday. Appointments reviewed and confirmed. Home visit complete.   BP 118/60   Pulse 67   Resp 16   Wt 200 lb (90.7 kg)   SpO2 98%   BMI 34.33 kg/m  Weight yesterday-- did not weigh  Last visit weight-- 198lbs      ACTION: Home visit completed     Patient Care Team: Rexanne Ingle, MD as PCP - General (Internal Medicine) Inocencio Soyla Lunger, MD as PCP - Electrophysiology (Cardiology) Shlomo Wilbert SAUNDERS, MD as PCP - Sleep Medicine (Cardiology) Bensimhon, Toribio SAUNDERS, MD as PCP - Cardiology (Cardiology) Cathern Andriette DEL, LCSW as Social Worker (Licensed Clinical Social Worker) Shamleffer, Donell Cardinal, MD as Consulting Physician (Endocrinology)  Patient Active Problem List   Diagnosis Date Noted   Chronic systolic heart failure (HCC) 05/08/2022   Type 2 diabetes  mellitus with stage 3a chronic kidney disease, with long-term current use of insulin  (HCC) 02/16/2022   Prosthetic joint implant failure, initial encounter 12/20/2021   Pseudophakia of both eyes 11/15/2021   DVT (deep venous thrombosis) (HCC) 07/31/2021   Acute blood loss anemia    Septic Arthritis, Infection of prosthetic right knee joint (HCC), MSSA infection 07/10/2021   Complication of internal right knee prosthesis 07/10/2021   CAD (coronary artery disease) 06/08/2021   Carotid artery disease 06/08/2021   Symptomatic bradycardia 06/08/2021   Paroxysmal atrial fibrillation (HCC) 06/08/2021   CKD (chronic kidney disease), stage III (HCC) 06/08/2021   Polyneuropathy associated with underlying disease 05/04/2020   Severe nonproliferative diabetic retinopathy of left eye, with macular edema, associated with type 2 diabetes mellitus (HCC) 11/05/2019   Severe nonproliferative diabetic retinopathy of right eye, with macular edema, associated with type 2 diabetes mellitus (HCC) 11/05/2019   Retinal hemorrhage of right eye 11/05/2019   Retinal hemorrhage of left eye 11/05/2019   Diabetes mellitus (HCC) 05/14/2019   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, with long-term current use of insulin  (HCC) 05/14/2019   Type 2 diabetes mellitus with diabetic polyneuropathy, with long-term current use of insulin  (HCC) 05/14/2019   History of gout 05/20/2018   Primary localized osteoarthritis of right knee 05/13/2018   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) 12/20/2017   Obesity (BMI 30-39.9) 11/11/2017   OSA (obstructive sleep apnea)  02/18/2017   Essential hypertension    L Breast Cancer    Hypothyroidism    Current Medications[1] Allergies[2]   Social History   Socioeconomic History   Marital status: Widowed    Spouse name: Not on file   Number of children: 3   Years of education: Not on file   Highest education level: Some college, no degree  Occupational History    Occupation: retired  Tobacco Use   Smoking status: Former    Current packs/day: 0.00    Average packs/day: 1 pack/day for 22.0 years (22.0 ttl pk-yrs)    Types: Cigarettes    Start date: 12    Quit date: 1983    Years since quitting: 43.0   Smokeless tobacco: Never  Vaping Use   Vaping status: Never Used  Substance and Sexual Activity   Alcohol use: Not Currently    Comment: 05/22/2018 glass of wine couple times/wk   Drug use: Not Currently   Sexual activity: Not Currently    Birth control/protection: None, Post-menopausal  Other Topics Concern   Not on file  Social History Narrative   Not on file   Social Drivers of Health   Tobacco Use: Medium Risk (04/28/2024)   Patient History    Smoking Tobacco Use: Former    Smokeless Tobacco Use: Never    Passive Exposure: Not on file  Financial Resource Strain: Medium Risk (12/22/2021)   Overall Financial Resource Strain (CARDIA)    Difficulty of Paying Living Expenses: Somewhat hard  Food Insecurity: Not on file  Transportation Needs: Unmet Transportation Needs (05/08/2022)   PRAPARE - Transportation    Lack of Transportation (Medical): Yes    Lack of Transportation (Non-Medical): Yes  Physical Activity: Not on file  Stress: Not on file  Social Connections: Not on file  Intimate Partner Violence: Not on file  Depression (PHQ2-9): Low Risk (10/24/2021)   Depression (PHQ2-9)    PHQ-2 Score: 0  Alcohol Screen: Not on file  Housing: Not on file  Utilities: Not on file  Health Literacy: Not on file    Physical Exam      Future Appointments  Date Time Provider Department Center  07/27/2024 11:30 AM [facility] PA/NP [facility] None  07/28/2024  7:15 AM [facility] DEVICE REMOTES [facility]  10/27/2024  7:15 AM [facility] DEVICE REMOTES [facility]  01/26/2025  7:15 AM [facility] DEVICE REMOTES [facility]  04/27/2025  7:15 AM [facility] DEVICE REMOTES [facility]                 [1]  Current Outpatient Medications:     acetaminophen  (TYLENOL ) 500 MG tablet, Take 500-1,000 mg by mouth every 6 (six) hours as needed (for pain or headaches)., Disp: , Rfl:    acetic acid 2 % otic solution, Place 4 drops into the left ear daily., Disp: , Rfl:    allopurinol  (ZYLOPRIM ) 100 MG tablet, Take 100 mg by mouth daily., Disp: , Rfl:    apixaban  (ELIQUIS ) 5 MG TABS tablet, Take 1 tablet (5 mg total) by mouth 2 (two) times daily., Disp: 180 tablet, Rfl: 3   atorvastatin  (LIPITOR ) 80 MG tablet, Take 1 tablet (80 mg total) by mouth at bedtime., Disp: 100 tablet, Rfl: 3   B Complex-Biotin -FA (B-COMPLEX PO), Take 1 tablet by mouth daily., Disp: , Rfl:    Biotin  10 MG TABS, Take 10 mg by mouth daily., Disp: , Rfl:    Blood Glucose Monitoring Suppl (ONETOUCH VERIO) w/Device KIT,  Use to check blood sugar, Disp: 1 kit, Rfl: 0   carvedilol  (COREG ) 3.125 MG tablet, Take 1 tablet (3.125 mg total) by mouth 2 (two) times daily with a meal., Disp: 180 tablet, Rfl: 3   cetirizine (ZYRTEC) 10 MG tablet, Take 10 mg by mouth daily., Disp: , Rfl:    Cinnamon 500 MG capsule, Take 2,000 mg by mouth daily., Disp: , Rfl:    clotrimazole-betamethasone (LOTRISONE) lotion, Apply 1 application topically 2 (two) times daily as needed (itching)., Disp: , Rfl:    empagliflozin  (JARDIANCE ) 10 MG TABS tablet, Take 1 tablet (10 mg total) by mouth daily., Disp: 90 tablet, Rfl: 3   ferrous sulfate  325 (65 FE) MG tablet, Take 1 tablet (325 mg total) by mouth 3 (three) times a week., Disp: 45 tablet, Rfl: 3   folic acid  (FOLVITE ) 1 MG tablet, Take 1 tablet (1 mg total) by mouth daily., Disp: 90 tablet, Rfl: 3   gabapentin  (NEURONTIN ) 300 MG capsule, Take 1 capsule (300 mg total) by mouth 2 (two) times daily., Disp: 60 capsule, Rfl: 2   gabapentin  (NEURONTIN ) 300 MG capsule, Take 2 capsules (600 mg total) by mouth 2 (two) times daily., Disp: 360 capsule, Rfl: 3   glucose blood (ONETOUCH VERIO) test strip, Check blood sugar 2 times daily, Disp: 100 each, Rfl: 12    insulin  aspart (NOVOLOG  FLEXPEN) 100 UNIT/ML FlexPen, DIAL  AND INJECT UNDER THE SKIN THREE TIMES DAILY PER CORRECTION SCALE. MAX DAILY DOSE 50 UNITS, Disp: 15 mL, Rfl: 0   Insulin  Pen Needle 32G X 4 MM MISC, 1 Device by Does not apply route in the morning, at noon, in the evening, and at bedtime., Disp: 400 each, Rfl: 3   Lancets (ONETOUCH DELICA PLUS LANCET33G) MISC, USE   TO CHECK GLUCOSE 4 TIMES DAILY, Disp: 100 each, Rfl: 3   latanoprost  (XALATAN ) 0.005 % ophthalmic solution, Place 1 drop into both eyes at bedtime., Disp: , Rfl:    levothyroxine  (SYNTHROID , LEVOTHROID) 75 MCG tablet, Take 75 mcg by mouth daily before breakfast., Disp: , Rfl:    LUMIGAN 0.01 % SOLN, Place 1 drop into both eyes at bedtime., Disp: , Rfl:    Multiple Vitamins-Minerals (ALIVE ONCE DAILY WOMENS 50+ PO), Take 1 tablet by mouth daily., Disp: , Rfl:    Omega-3 Fatty Acids (FISH OIL) 1200 MG CAPS, Take 1,200 mg by mouth at bedtime. Taking 2 capsules at night, Disp: , Rfl:    ondansetron  (ZOFRAN ) 4 MG tablet, Take 1 tablet (4 mg total) by mouth every 8 (eight) hours as needed for nausea or vomiting., Disp: 20 tablet, Rfl: 0   spironolactone  (ALDACTONE ) 25 MG tablet, Take 1 tablet (25 mg total) by mouth daily., Disp: 90 tablet, Rfl: 3   torsemide  (DEMADEX ) 20 MG tablet, TAKE 3 TABLETS BY MOUTH DAILY, Disp: 90 tablet, Rfl: 3   Travoprost, BAK Free, (TRAVATAN) 0.004 % SOLN ophthalmic solution, Place 1 drop into both eyes at bedtime., Disp: , Rfl:    Continuous Glucose Sensor (DEXCOM G6 SENSOR) MISC, 1 Device by Does not apply route as directed. (Patient not taking: Reported on 06/15/2024), Disp: 9 each, Rfl: 3   Continuous Glucose Transmitter (DEXCOM G6 TRANSMITTER) MISC, Change every 90 days (Patient not taking: Reported on 06/15/2024), Disp: 1 each, Rfl: 3   Insulin  Glargine (BASAGLAR  KWIKPEN) 100 UNIT/ML, Inject 40 Units into the skin at bedtime. (Patient taking differently: Inject 50 Units into the skin at bedtime.), Disp: 45  mL, Rfl: 2 [2]  Allergies  Allergen Reactions   Codeine Other (See Comments)   Other Other (See Comments)    Blood Product Refusal (Jehovah's witness)     Tape Other (See Comments)    Leaves blisters and marks on the skin   Sulfa Antibiotics Rash   Uloric  [Febuxostat ] Rash

## 2024-06-18 ENCOUNTER — Other Ambulatory Visit (HOSPITAL_COMMUNITY): Payer: Self-pay

## 2024-06-18 NOTE — Progress Notes (Signed)
 Paramedicine Encounter    Patient ID: Danielle [name], female    DOB: [birth year], 79 y.o.   MRN: [id number]    Med Rec only due to impending inclement weather.   2nd pill box filled.   Powell Mirza, EMT-Paramedic [phone] 06/18/2024

## 2024-06-22 ENCOUNTER — Telehealth (HOSPITAL_COMMUNITY): Payer: Self-pay

## 2024-06-22 NOTE — Telephone Encounter (Signed)
 Advanced Heart Failure Patient Advocate Encounter  The patient was renewed for a [employer] grant that will help cover the cost of Carvedilol , Eliquis , Jardiance , Spironolactone .  Total amount awarded, $7,500.  Effective: 05/23/2024 - 05/22/2025.  BIN [id number] PCN [id number] [id number] ID [id number]  Pharmacy provided with approval and processing information. Patient informed via MyChart.  Rachel DEL, CPhT Rx Patient Advocate Phone: [phone]

## 2024-07-02 ENCOUNTER — Other Ambulatory Visit (HOSPITAL_COMMUNITY): Payer: Self-pay

## 2024-07-02 ENCOUNTER — Other Ambulatory Visit: Payer: Self-pay | Admitting: Internal Medicine

## 2024-07-02 DIAGNOSIS — Z1231 Encounter for screening mammogram for malignant neoplasm of breast: Secondary | ICD-10-CM

## 2024-07-02 NOTE — Progress Notes (Unsigned)
 Paramedicine Encounter    Patient ID: Danielle [name], female    DOB: 08-02-1945, 79 y.o.   MRN: [id number]   Patient Care Team: Rexanne Ingle, MD as PCP - General (Internal Medicine) Inocencio Soyla Lunger, MD as PCP - Electrophysiology (Cardiology) Shlomo Wilbert SAUNDERS, MD as PCP - Sleep Medicine (Cardiology) Bensimhon, Toribio SAUNDERS, MD as PCP - Cardiology (Cardiology) Cathern Andriette DEL, LCSW as Social Worker (Licensed Clinical Social Worker) Shamleffer, Donell Cardinal, MD as Consulting Physician (Endocrinology)  Patient Active Problem List   Diagnosis Date Noted   Chronic systolic heart failure (HCC) 05/08/2022   Type 2 diabetes mellitus with stage 3a chronic kidney disease, with long-term current use of insulin  (HCC) 02/16/2022   Prosthetic joint implant failure, initial encounter 12/20/2021   Pseudophakia of both eyes 11/15/2021   DVT (deep venous thrombosis) (HCC) 07/31/2021   Acute blood loss anemia    Septic Arthritis, Infection of prosthetic right knee joint (HCC), MSSA infection 07/10/2021   Complication of internal right knee prosthesis 07/10/2021   CAD (coronary artery disease) 06/08/2021   Carotid artery disease 06/08/2021   Symptomatic bradycardia 06/08/2021   Paroxysmal atrial fibrillation (HCC) 06/08/2021   CKD (chronic kidney disease), stage III (HCC) 06/08/2021   Polyneuropathy associated with underlying disease 05/04/2020   Severe nonproliferative diabetic retinopathy of left eye, with macular edema, associated with type 2 diabetes mellitus (HCC) 11/05/2019   Severe nonproliferative diabetic retinopathy of right eye, with macular edema, associated with type 2 diabetes mellitus (HCC) 11/05/2019   Retinal hemorrhage of right eye 11/05/2019   Retinal hemorrhage of left eye 11/05/2019   Diabetes mellitus (HCC) 05/14/2019   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, with long-term current use of insulin  (HCC) 05/14/2019   Type 2 diabetes mellitus with diabetic  polyneuropathy, with long-term current use of insulin  (HCC) 05/14/2019   History of gout 05/20/2018   Primary localized osteoarthritis of right knee 05/13/2018   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) 12/20/2017   Obesity (BMI 30-39.9) 11/11/2017   OSA (obstructive sleep apnea) 02/18/2017   Essential hypertension    L Breast Cancer    Hypothyroidism    Current Medications[1] Allergies[2]   Social History   Socioeconomic History   Marital status: Widowed    Spouse name: Not on file   Number of children: 3   Years of education: Not on file   Highest education level: Some college, no degree  Occupational History   Occupation: retired  Tobacco Use   Smoking status: Former    Current packs/day: 0.00    Average packs/day: 1 pack/day for 22.0 years (22.0 ttl pk-yrs)    Types: Cigarettes    Start date: 27    Quit date: 1983    Years since quitting: 43.1   Smokeless tobacco: Never  Vaping Use   Vaping status: Never Used  Substance and Sexual Activity   Alcohol use: Not Currently    Comment: 05/22/2018 glass of wine couple times/wk   Drug use: Not Currently   Sexual activity: Not Currently    Birth control/protection: None, Post-menopausal  Other Topics Concern   Not on file  Social History Narrative   Not on file   Social Drivers of Health   Tobacco Use: Medium Risk (04/28/2024)   Patient History    Smoking Tobacco Use: Former    Smokeless Tobacco Use: Never    Passive Exposure: Not on file  Financial Resource Strain: Medium Risk (12/22/2021)   Overall Financial Resource Strain (CARDIA)  Difficulty of Paying Living Expenses: Somewhat hard  Food Insecurity: Not on file  Transportation Needs: Unmet Transportation Needs (05/08/2022)   PRAPARE - Transportation    Lack of Transportation (Medical): Yes    Lack of Transportation (Non-Medical): Yes  Physical Activity: Not on file  Stress: Not on file  Social Connections: Not on file  Intimate Partner  Violence: Not on file  Depression (PHQ2-9): Low Risk (10/24/2021)   Depression (PHQ2-9)    PHQ-2 Score: 0  Alcohol Screen: Not on file  Housing: Not on file  Utilities: Not on file  Health Literacy: Not on file    Physical Exam      Future Appointments  Date Time Provider Department Center  07/27/2024 11:30 AM [facility] PA/NP [facility] None  07/28/2024  7:15 AM [facility] DEVICE REMOTES [facility]  10/27/2024  7:15 AM [facility] DEVICE REMOTES [facility]  01/26/2025  7:15 AM [facility] DEVICE REMOTES [facility]V  04/27/2025  7:15 AM [facility] DEVICE REMOTES [facility]     ACTION: {Paramed Action:[phone]}          [1]  Current Outpatient Medications:    acetaminophen  (TYLENOL ) 500 MG tablet, Take 500-1,000 mg by mouth every 6 (six) hours as needed (for pain or headaches)., Disp: , Rfl:    acetic acid 2 % otic solution, Place 4 drops into the left ear daily., Disp: , Rfl:    allopurinol  (ZYLOPRIM ) 100 MG tablet, Take 100 mg by mouth daily., Disp: , Rfl:    apixaban  (ELIQUIS ) 5 MG TABS tablet, Take 1 tablet (5 mg total) by mouth 2 (two) times daily., Disp: 180 tablet, Rfl: 3   atorvastatin  (LIPITOR ) 80 MG tablet, Take 1 tablet (80 mg total) by mouth at bedtime., Disp: 100 tablet, Rfl: 3   B Complex-Biotin -FA (B-COMPLEX PO), Take 1 tablet by mouth daily., Disp: , Rfl:    Biotin  10 MG TABS, Take 10 mg by mouth daily., Disp: , Rfl:    Blood Glucose Monitoring Suppl (ONETOUCH VERIO) w/Device KIT, Use to check blood sugar, Disp: 1 kit, Rfl: 0   carvedilol  (COREG ) 3.125 MG tablet, Take 1 tablet (3.125 mg total) by mouth 2 (two) times daily with a meal., Disp: 180 tablet, Rfl: 3   cetirizine (ZYRTEC) 10 MG tablet, Take 10 mg by mouth daily., Disp: , Rfl:    Cinnamon 500 MG capsule, Take 2,000 mg by mouth daily., Disp: , Rfl:    clotrimazole-betamethasone (LOTRISONE) lotion, Apply 1 application topically 2 (two) times daily as needed (itching)., Disp: , Rfl:    Continuous Glucose Sensor  (DEXCOM G6 SENSOR) MISC, 1 Device by Does not apply route as directed. (Patient not taking: Reported on 06/15/2024), Disp: 9 each, Rfl: 3   Continuous Glucose Transmitter (DEXCOM G6 TRANSMITTER) MISC, Change every 90 days (Patient not taking: Reported on 06/15/2024), Disp: 1 each, Rfl: 3   empagliflozin  (JARDIANCE ) 10 MG TABS tablet, Take 1 tablet (10 mg total) by mouth daily., Disp: 90 tablet, Rfl: 3   ferrous sulfate  325 (65 FE) MG tablet, Take 1 tablet (325 mg total) by mouth 3 (three) times a week., Disp: 45 tablet, Rfl: 3   folic acid  (FOLVITE ) 1 MG tablet, Take 1 tablet (1 mg total) by mouth daily., Disp: 90 tablet, Rfl: 3   gabapentin  (NEURONTIN ) 300 MG capsule, Take 1 capsule (300 mg total) by mouth 2 (two) times daily., Disp: 60 capsule, Rfl: 2   gabapentin  (NEURONTIN ) 300 MG capsule, Take 2 capsules (600 mg total)  by mouth 2 (two) times daily., Disp: 360 capsule, Rfl: 3   glucose blood (ONETOUCH VERIO) test strip, Check blood sugar 2 times daily, Disp: 100 each, Rfl: 12   insulin  aspart (NOVOLOG  FLEXPEN) 100 UNIT/ML FlexPen, DIAL  AND INJECT UNDER THE SKIN THREE TIMES DAILY PER CORRECTION SCALE. MAX DAILY DOSE 50 UNITS, Disp: 15 mL, Rfl: 0   Insulin  Glargine (BASAGLAR  KWIKPEN) 100 UNIT/ML, Inject 40 Units into the skin at bedtime. (Patient taking differently: Inject 50 Units into the skin at bedtime.), Disp: 45 mL, Rfl: 2   Insulin  Pen Needle 32G X 4 MM MISC, 1 Device by Does not apply route in the morning, at noon, in the evening, and at bedtime., Disp: 400 each, Rfl: 3   Lancets (ONETOUCH DELICA PLUS LANCET33G) MISC, USE   TO CHECK GLUCOSE 4 TIMES DAILY, Disp: 100 each, Rfl: 3   latanoprost  (XALATAN ) 0.005 % ophthalmic solution, Place 1 drop into both eyes at bedtime., Disp: , Rfl:    levothyroxine  (SYNTHROID , LEVOTHROID) 75 MCG tablet, Take 75 mcg by mouth daily before breakfast., Disp: , Rfl:    LUMIGAN 0.01 % SOLN, Place 1 drop into both eyes at bedtime., Disp: , Rfl:    Multiple  Vitamins-Minerals (ALIVE ONCE DAILY WOMENS 50+ PO), Take 1 tablet by mouth daily., Disp: , Rfl:    Omega-3 Fatty Acids (FISH OIL) 1200 MG CAPS, Take 1,200 mg by mouth at bedtime. Taking 2 capsules at night, Disp: , Rfl:    ondansetron  (ZOFRAN ) 4 MG tablet, Take 1 tablet (4 mg total) by mouth every 8 (eight) hours as needed for nausea or vomiting., Disp: 20 tablet, Rfl: 0   spironolactone  (ALDACTONE ) 25 MG tablet, Take 1 tablet (25 mg total) by mouth daily., Disp: 90 tablet, Rfl: 3   torsemide  (DEMADEX ) 20 MG tablet, TAKE 3 TABLETS BY MOUTH DAILY, Disp: 90 tablet, Rfl: 3   Travoprost, BAK Free, (TRAVATAN) 0.004 % SOLN ophthalmic solution, Place 1 drop into both eyes at bedtime., Disp: , Rfl:  [2]  Allergies Allergen Reactions   Codeine Other (See Comments)   Other Other (See Comments)    Blood Product Refusal (Jehovah's witness)     Tape Other (See Comments)    Leaves blisters and marks on the skin   Sulfa Antibiotics Rash   Uloric  [Febuxostat ] Rash

## 2024-07-14 ENCOUNTER — Encounter

## 2024-07-21 ENCOUNTER — Ambulatory Visit

## 2024-07-27 ENCOUNTER — Ambulatory Visit (HOSPITAL_COMMUNITY)

## 2024-07-28 ENCOUNTER — Ambulatory Visit

## 2024-10-13 ENCOUNTER — Encounter

## 2024-10-27 ENCOUNTER — Ambulatory Visit

## 2025-01-12 ENCOUNTER — Encounter

## 2025-01-26 ENCOUNTER — Ambulatory Visit

## 2025-04-13 ENCOUNTER — Encounter

## 2025-04-27 ENCOUNTER — Ambulatory Visit

## 2025-07-13 ENCOUNTER — Encounter
